# Patient Record
Sex: Female | Born: 1935
Health system: Southern US, Community
[De-identification: ages and names within clinical notes are randomized; demographics above are authoritative.]

## PROBLEM LIST (undated history)

## (undated) DIAGNOSIS — I495 Sick sinus syndrome: Secondary | ICD-10-CM

## (undated) DIAGNOSIS — R58 Hemorrhage, not elsewhere classified: Secondary | ICD-10-CM

## (undated) DIAGNOSIS — G4733 Obstructive sleep apnea (adult) (pediatric): Secondary | ICD-10-CM

## (undated) DIAGNOSIS — Z7901 Long term (current) use of anticoagulants: Secondary | ICD-10-CM

## (undated) DIAGNOSIS — E785 Hyperlipidemia, unspecified: Secondary | ICD-10-CM

## (undated) DIAGNOSIS — I4821 Permanent atrial fibrillation: Secondary | ICD-10-CM

## (undated) DIAGNOSIS — K5792 Diverticulitis of intestine, part unspecified, without perforation or abscess without bleeding: Secondary | ICD-10-CM

## (undated) DIAGNOSIS — I5040 Unspecified combined systolic (congestive) and diastolic (congestive) heart failure: Secondary | ICD-10-CM

## (undated) DIAGNOSIS — E669 Obesity, unspecified: Secondary | ICD-10-CM

## (undated) DIAGNOSIS — N183 Chronic kidney disease, stage 3 unspecified: Secondary | ICD-10-CM

## (undated) DIAGNOSIS — Z9989 Dependence on other enabling machines and devices: Secondary | ICD-10-CM

## (undated) DIAGNOSIS — T82118A Breakdown (mechanical) of other cardiac electronic device, initial encounter: Secondary | ICD-10-CM

## (undated) DIAGNOSIS — C569 Malignant neoplasm of unspecified ovary: Secondary | ICD-10-CM

## (undated) DIAGNOSIS — I739 Peripheral vascular disease, unspecified: Secondary | ICD-10-CM

## (undated) DIAGNOSIS — I219 Acute myocardial infarction, unspecified: Secondary | ICD-10-CM

## (undated) DIAGNOSIS — I701 Atherosclerosis of renal artery: Secondary | ICD-10-CM

## (undated) DIAGNOSIS — I447 Left bundle-branch block, unspecified: Secondary | ICD-10-CM

## (undated) DIAGNOSIS — I8390 Asymptomatic varicose veins of unspecified lower extremity: Secondary | ICD-10-CM

## (undated) DIAGNOSIS — I2510 Atherosclerotic heart disease of native coronary artery without angina pectoris: Secondary | ICD-10-CM

## (undated) DIAGNOSIS — I10 Essential (primary) hypertension: Secondary | ICD-10-CM

## (undated) HISTORY — DX: Unspecified combined systolic (congestive) and diastolic (congestive) heart failure: I50.40

## (undated) HISTORY — PX: SALIVARY GLAND SURGERY: SHX768

## (undated) HISTORY — PX: ABDOMINAL HYSTERECTOMY: SHX81

## (undated) HISTORY — DX: Essential (primary) hypertension: I10

## (undated) HISTORY — DX: Chronic kidney disease, stage 3 unspecified: N18.30

## (undated) HISTORY — DX: Sick sinus syndrome: I49.5

## (undated) HISTORY — DX: Diverticulitis of intestine, part unspecified, without perforation or abscess without bleeding: K57.92

## (undated) HISTORY — DX: Breakdown (mechanical) of other cardiac electronic device, initial encounter: T82.118A

## (undated) HISTORY — DX: Hyperlipidemia, unspecified: E78.5

## (undated) HISTORY — PX: CORONARY ANGIOPLASTY WITH STENT PLACEMENT: SHX49

## (undated) HISTORY — DX: Chronic kidney disease, stage 3 (moderate): N18.3

## (undated) HISTORY — DX: Permanent atrial fibrillation: I48.21

## (undated) HISTORY — DX: Obesity, unspecified: E66.9

## (undated) HISTORY — PX: OTHER SURGICAL HISTORY: SHX169

## (undated) HISTORY — PX: CHOLECYSTECTOMY: SHX55

---

## 1975-02-04 HISTORY — PX: VARICOSE VEIN SURGERY: SHX832

## 1978-10-05 HISTORY — PX: CHOLECYSTECTOMY: SHX55

## 1998-02-03 HISTORY — PX: UMBILICAL GRANULOMA EXCISION: SHX2597

## 1998-02-03 HISTORY — PX: CORONARY ANGIOPLASTY WITH STENT PLACEMENT: SHX49

## 1998-03-01 ENCOUNTER — Encounter: Payer: Self-pay | Admitting: Geriatric Medicine

## 1998-03-01 ENCOUNTER — Inpatient Hospital Stay (HOSPITAL_COMMUNITY): Admission: EM | Admit: 1998-03-01 | Discharge: 1998-03-07 | Payer: Self-pay | Admitting: Emergency Medicine

## 1998-03-01 ENCOUNTER — Encounter: Payer: Self-pay | Admitting: Emergency Medicine

## 1998-06-29 ENCOUNTER — Encounter: Payer: Self-pay | Admitting: Emergency Medicine

## 1998-06-29 ENCOUNTER — Inpatient Hospital Stay (HOSPITAL_COMMUNITY): Admission: EM | Admit: 1998-06-29 | Discharge: 1998-07-03 | Payer: Self-pay | Admitting: Emergency Medicine

## 1998-07-24 ENCOUNTER — Encounter (HOSPITAL_COMMUNITY): Admission: RE | Admit: 1998-07-24 | Discharge: 1998-10-22 | Payer: Self-pay | Admitting: Cardiology

## 1998-09-24 ENCOUNTER — Ambulatory Visit (HOSPITAL_COMMUNITY): Admission: RE | Admit: 1998-09-24 | Discharge: 1998-09-24 | Payer: Self-pay | Admitting: Cardiology

## 1998-09-24 ENCOUNTER — Encounter: Payer: Self-pay | Admitting: Cardiology

## 1998-10-05 HISTORY — PX: SALIVARY GLAND SURGERY: SHX768

## 1998-10-23 ENCOUNTER — Encounter (HOSPITAL_COMMUNITY): Admission: RE | Admit: 1998-10-23 | Discharge: 1999-01-21 | Payer: Self-pay | Admitting: Cardiology

## 1999-08-13 ENCOUNTER — Encounter: Admission: RE | Admit: 1999-08-13 | Discharge: 1999-08-13 | Payer: Self-pay | Admitting: Internal Medicine

## 1999-08-13 ENCOUNTER — Encounter: Payer: Self-pay | Admitting: Internal Medicine

## 1999-11-20 ENCOUNTER — Ambulatory Visit (HOSPITAL_COMMUNITY): Admission: RE | Admit: 1999-11-20 | Discharge: 1999-11-20 | Payer: Self-pay

## 2000-02-04 DIAGNOSIS — I219 Acute myocardial infarction, unspecified: Secondary | ICD-10-CM

## 2000-02-04 HISTORY — DX: Acute myocardial infarction, unspecified: I21.9

## 2000-12-16 ENCOUNTER — Encounter: Admission: RE | Admit: 2000-12-16 | Discharge: 2000-12-16 | Payer: Self-pay | Admitting: Internal Medicine

## 2000-12-16 ENCOUNTER — Encounter: Payer: Self-pay | Admitting: Internal Medicine

## 2001-12-28 ENCOUNTER — Encounter: Admission: RE | Admit: 2001-12-28 | Discharge: 2001-12-28 | Payer: Self-pay | Admitting: Internal Medicine

## 2001-12-28 ENCOUNTER — Encounter: Payer: Self-pay | Admitting: Internal Medicine

## 2002-02-03 HISTORY — PX: COLON SURGERY: SHX602

## 2002-08-12 ENCOUNTER — Encounter: Payer: Self-pay | Admitting: Internal Medicine

## 2002-08-12 ENCOUNTER — Inpatient Hospital Stay (HOSPITAL_COMMUNITY): Admission: AD | Admit: 2002-08-12 | Discharge: 2002-08-15 | Payer: Self-pay | Admitting: Internal Medicine

## 2002-08-24 ENCOUNTER — Encounter (INDEPENDENT_AMBULATORY_CARE_PROVIDER_SITE_OTHER): Payer: Self-pay | Admitting: *Deleted

## 2002-08-24 ENCOUNTER — Ambulatory Visit (HOSPITAL_COMMUNITY): Admission: RE | Admit: 2002-08-24 | Discharge: 2002-08-24 | Payer: Self-pay | Admitting: *Deleted

## 2002-11-04 ENCOUNTER — Ambulatory Visit (HOSPITAL_COMMUNITY): Admission: RE | Admit: 2002-11-04 | Discharge: 2002-11-04 | Payer: Self-pay | Admitting: Gastroenterology

## 2002-11-28 ENCOUNTER — Encounter: Payer: Self-pay | Admitting: General Surgery

## 2002-11-29 ENCOUNTER — Ambulatory Visit (HOSPITAL_COMMUNITY): Admission: RE | Admit: 2002-11-29 | Discharge: 2002-11-29 | Payer: Self-pay | Admitting: *Deleted

## 2002-12-06 ENCOUNTER — Encounter (INDEPENDENT_AMBULATORY_CARE_PROVIDER_SITE_OTHER): Payer: Self-pay | Admitting: Specialist

## 2002-12-06 ENCOUNTER — Inpatient Hospital Stay (HOSPITAL_COMMUNITY): Admission: RE | Admit: 2002-12-06 | Discharge: 2002-12-13 | Payer: Self-pay | Admitting: General Surgery

## 2003-02-04 HISTORY — PX: HERNIA REPAIR: SHX51

## 2003-06-20 ENCOUNTER — Ambulatory Visit (HOSPITAL_COMMUNITY): Admission: RE | Admit: 2003-06-20 | Discharge: 2003-06-20 | Payer: Self-pay | Admitting: Oncology

## 2003-11-01 ENCOUNTER — Ambulatory Visit (HOSPITAL_COMMUNITY): Admission: RE | Admit: 2003-11-01 | Discharge: 2003-11-01 | Payer: Self-pay | Admitting: Cardiology

## 2003-11-01 HISTORY — PX: CARDIAC CATHETERIZATION: SHX172

## 2003-11-29 ENCOUNTER — Inpatient Hospital Stay (HOSPITAL_COMMUNITY): Admission: RE | Admit: 2003-11-29 | Discharge: 2003-11-30 | Payer: Self-pay | Admitting: General Surgery

## 2004-03-21 ENCOUNTER — Ambulatory Visit: Payer: Self-pay | Admitting: Oncology

## 2004-04-09 ENCOUNTER — Encounter: Admission: RE | Admit: 2004-04-09 | Discharge: 2004-04-09 | Payer: Self-pay | Admitting: Oncology

## 2004-04-30 ENCOUNTER — Ambulatory Visit (HOSPITAL_COMMUNITY): Admission: RE | Admit: 2004-04-30 | Discharge: 2004-04-30 | Payer: Self-pay | Admitting: Oncology

## 2004-05-10 ENCOUNTER — Ambulatory Visit: Payer: Self-pay | Admitting: Oncology

## 2004-08-23 ENCOUNTER — Ambulatory Visit: Payer: Self-pay | Admitting: Oncology

## 2004-10-30 ENCOUNTER — Ambulatory Visit (HOSPITAL_COMMUNITY): Admission: RE | Admit: 2004-10-30 | Discharge: 2004-10-30 | Payer: Self-pay | Admitting: Oncology

## 2005-01-02 ENCOUNTER — Ambulatory Visit: Payer: Self-pay | Admitting: Oncology

## 2005-05-05 ENCOUNTER — Ambulatory Visit (HOSPITAL_COMMUNITY): Admission: RE | Admit: 2005-05-05 | Discharge: 2005-05-05 | Payer: Self-pay | Admitting: Oncology

## 2005-05-12 ENCOUNTER — Ambulatory Visit (HOSPITAL_COMMUNITY): Admission: RE | Admit: 2005-05-12 | Discharge: 2005-05-12 | Payer: Self-pay | Admitting: Oncology

## 2005-05-15 ENCOUNTER — Ambulatory Visit: Payer: Self-pay | Admitting: Oncology

## 2005-05-19 ENCOUNTER — Encounter: Admission: RE | Admit: 2005-05-19 | Discharge: 2005-05-19 | Payer: Self-pay | Admitting: Internal Medicine

## 2005-06-17 ENCOUNTER — Encounter: Admission: RE | Admit: 2005-06-17 | Discharge: 2005-06-17 | Payer: Self-pay | Admitting: Internal Medicine

## 2005-07-01 ENCOUNTER — Encounter: Admission: RE | Admit: 2005-07-01 | Discharge: 2005-07-01 | Payer: Self-pay | Admitting: Internal Medicine

## 2005-07-07 ENCOUNTER — Ambulatory Visit (HOSPITAL_COMMUNITY): Admission: RE | Admit: 2005-07-07 | Discharge: 2005-07-07 | Payer: Self-pay | Admitting: Oncology

## 2005-07-07 ENCOUNTER — Encounter (INDEPENDENT_AMBULATORY_CARE_PROVIDER_SITE_OTHER): Payer: Self-pay | Admitting: *Deleted

## 2005-07-18 ENCOUNTER — Ambulatory Visit: Payer: Self-pay | Admitting: Oncology

## 2005-10-01 ENCOUNTER — Encounter (INDEPENDENT_AMBULATORY_CARE_PROVIDER_SITE_OTHER): Payer: Self-pay | Admitting: Specialist

## 2005-10-01 ENCOUNTER — Inpatient Hospital Stay (HOSPITAL_COMMUNITY): Admission: RE | Admit: 2005-10-01 | Discharge: 2005-10-06 | Payer: Self-pay | Admitting: General Surgery

## 2005-10-20 ENCOUNTER — Ambulatory Visit: Payer: Self-pay | Admitting: Oncology

## 2005-10-22 LAB — COMPREHENSIVE METABOLIC PANEL
ALT: 20 U/L (ref 0–40)
CO2: 31 mEq/L (ref 19–32)
Calcium: 9.5 mg/dL (ref 8.4–10.5)
Chloride: 102 mEq/L (ref 96–112)
Creatinine, Ser: 0.69 mg/dL (ref 0.40–1.20)
Glucose, Bld: 85 mg/dL (ref 70–99)
Sodium: 143 mEq/L (ref 135–145)
Total Protein: 6.5 g/dL (ref 6.0–8.3)

## 2005-10-22 LAB — CBC WITH DIFFERENTIAL/PLATELET
BASO%: 0 % (ref 0.0–2.0)
Eosinophils Absolute: 0.4 10*3/uL (ref 0.0–0.5)
HCT: 35.9 % (ref 34.8–46.6)
LYMPH%: 24.9 % (ref 14.0–48.0)
MCHC: 34.3 g/dL (ref 32.0–36.0)
MONO#: 0.5 10*3/uL (ref 0.1–0.9)
NEUT#: 4 10*3/uL (ref 1.5–6.5)
NEUT%: 60.6 % (ref 39.6–76.8)
Platelets: 512 10*3/uL — ABNORMAL HIGH (ref 145–400)
WBC: 6.5 10*3/uL (ref 3.9–10.0)
lymph#: 1.6 10*3/uL (ref 0.9–3.3)

## 2006-02-13 ENCOUNTER — Ambulatory Visit: Payer: Self-pay | Admitting: Oncology

## 2006-02-18 LAB — COMPREHENSIVE METABOLIC PANEL
AST: 23 U/L (ref 0–37)
Albumin: 3.9 g/dL (ref 3.5–5.2)
Alkaline Phosphatase: 99 U/L (ref 39–117)
Glucose, Bld: 89 mg/dL (ref 70–99)
Potassium: 4 mEq/L (ref 3.5–5.3)
Sodium: 142 mEq/L (ref 135–145)
Total Bilirubin: 0.6 mg/dL (ref 0.3–1.2)
Total Protein: 6.6 g/dL (ref 6.0–8.3)

## 2006-02-18 LAB — CBC WITH DIFFERENTIAL/PLATELET
BASO%: 0.2 % (ref 0.0–2.0)
Eosinophils Absolute: 0.2 10*3/uL (ref 0.0–0.5)
MCHC: 33.8 g/dL (ref 32.0–36.0)
MONO#: 0.5 10*3/uL (ref 0.1–0.9)
NEUT#: 3.8 10*3/uL (ref 1.5–6.5)
RBC: 4.35 10*6/uL (ref 3.70–5.32)
WBC: 6 10*3/uL (ref 3.9–10.0)
lymph#: 1.5 10*3/uL (ref 0.9–3.3)

## 2006-04-03 ENCOUNTER — Ambulatory Visit (HOSPITAL_BASED_OUTPATIENT_CLINIC_OR_DEPARTMENT_OTHER): Admission: RE | Admit: 2006-04-03 | Discharge: 2006-04-03 | Payer: Self-pay | Admitting: Orthopedic Surgery

## 2006-05-22 ENCOUNTER — Encounter: Admission: RE | Admit: 2006-05-22 | Discharge: 2006-05-22 | Payer: Self-pay | Admitting: Oncology

## 2006-05-28 ENCOUNTER — Ambulatory Visit: Payer: Self-pay | Admitting: Oncology

## 2006-06-02 LAB — CBC WITH DIFFERENTIAL/PLATELET
Basophils Absolute: 0 10*3/uL (ref 0.0–0.1)
EOS%: 3.6 % (ref 0.0–7.0)
Eosinophils Absolute: 0.2 10*3/uL (ref 0.0–0.5)
HGB: 13.7 g/dL (ref 11.6–15.9)
LYMPH%: 22.7 % (ref 14.0–48.0)
MCH: 30.2 pg (ref 26.0–34.0)
MCV: 86.4 fL (ref 81.0–101.0)
MONO%: 7.6 % (ref 0.0–13.0)
NEUT#: 3.7 10*3/uL (ref 1.5–6.5)
Platelets: 254 10*3/uL (ref 145–400)
RBC: 4.54 10*6/uL (ref 3.70–5.32)
RDW: 14.1 % (ref 11.3–14.5)

## 2006-06-02 LAB — COMPREHENSIVE METABOLIC PANEL
AST: 27 U/L (ref 0–37)
Alkaline Phosphatase: 95 U/L (ref 39–117)
BUN: 19 mg/dL (ref 6–23)
Glucose, Bld: 89 mg/dL (ref 70–99)
Total Bilirubin: 0.6 mg/dL (ref 0.3–1.2)

## 2006-06-08 ENCOUNTER — Ambulatory Visit (HOSPITAL_COMMUNITY): Admission: RE | Admit: 2006-06-08 | Discharge: 2006-06-08 | Payer: Self-pay | Admitting: Oncology

## 2006-07-21 ENCOUNTER — Observation Stay (HOSPITAL_COMMUNITY): Admission: EM | Admit: 2006-07-21 | Discharge: 2006-07-24 | Payer: Self-pay | Admitting: Emergency Medicine

## 2006-12-14 ENCOUNTER — Ambulatory Visit: Payer: Self-pay | Admitting: Oncology

## 2006-12-16 LAB — CBC WITH DIFFERENTIAL/PLATELET
Basophils Absolute: 0 10*3/uL (ref 0.0–0.1)
Eosinophils Absolute: 0.2 10*3/uL (ref 0.0–0.5)
HCT: 39.4 % (ref 34.8–46.6)
HGB: 13.6 g/dL (ref 11.6–15.9)
LYMPH%: 22.5 % (ref 14.0–48.0)
MCH: 30.1 pg (ref 26.0–34.0)
MCV: 87.4 fL (ref 81.0–101.0)
MONO%: 7.1 % (ref 0.0–13.0)
NEUT#: 4.7 10*3/uL (ref 1.5–6.5)
NEUT%: 66.7 % (ref 39.6–76.8)
Platelets: 269 10*3/uL (ref 145–400)

## 2006-12-16 LAB — COMPREHENSIVE METABOLIC PANEL
Albumin: 4 g/dL (ref 3.5–5.2)
Alkaline Phosphatase: 102 U/L (ref 39–117)
BUN: 21 mg/dL (ref 6–23)
Creatinine, Ser: 0.68 mg/dL (ref 0.40–1.20)
Glucose, Bld: 80 mg/dL (ref 70–99)
Potassium: 4 mEq/L (ref 3.5–5.3)

## 2007-05-25 ENCOUNTER — Encounter: Admission: RE | Admit: 2007-05-25 | Discharge: 2007-05-25 | Payer: Self-pay | Admitting: Oncology

## 2007-06-01 ENCOUNTER — Ambulatory Visit: Payer: Self-pay | Admitting: Oncology

## 2007-06-04 LAB — COMPREHENSIVE METABOLIC PANEL
ALT: 19 U/L (ref 0–35)
BUN: 16 mg/dL (ref 6–23)
CO2: 29 mEq/L (ref 19–32)
Calcium: 8.8 mg/dL (ref 8.4–10.5)
Chloride: 104 mEq/L (ref 96–112)
Creatinine, Ser: 0.64 mg/dL (ref 0.40–1.20)
Glucose, Bld: 94 mg/dL (ref 70–99)
Total Bilirubin: 0.6 mg/dL (ref 0.3–1.2)

## 2007-06-04 LAB — CBC WITH DIFFERENTIAL/PLATELET
Basophils Absolute: 0 10*3/uL (ref 0.0–0.1)
Eosinophils Absolute: 0.2 10*3/uL (ref 0.0–0.5)
HCT: 39.6 % (ref 34.8–46.6)
HGB: 13.8 g/dL (ref 11.6–15.9)
LYMPH%: 23.5 % (ref 14.0–48.0)
MCHC: 34.8 g/dL (ref 32.0–36.0)
MONO#: 0.6 10*3/uL (ref 0.1–0.9)
NEUT#: 3.5 10*3/uL (ref 1.5–6.5)
NEUT%: 61.5 % (ref 39.6–76.8)
Platelets: 260 10*3/uL (ref 145–400)
WBC: 5.6 10*3/uL (ref 3.9–10.0)
lymph#: 1.3 10*3/uL (ref 0.9–3.3)

## 2007-06-11 ENCOUNTER — Ambulatory Visit (HOSPITAL_COMMUNITY): Admission: RE | Admit: 2007-06-11 | Discharge: 2007-06-11 | Payer: Self-pay | Admitting: Oncology

## 2007-09-03 ENCOUNTER — Inpatient Hospital Stay (HOSPITAL_BASED_OUTPATIENT_CLINIC_OR_DEPARTMENT_OTHER): Admission: RE | Admit: 2007-09-03 | Discharge: 2007-09-03 | Payer: Self-pay | Admitting: Cardiology

## 2007-09-03 HISTORY — PX: CARDIAC CATHETERIZATION: SHX172

## 2007-09-08 ENCOUNTER — Inpatient Hospital Stay (HOSPITAL_COMMUNITY): Admission: RE | Admit: 2007-09-08 | Discharge: 2007-09-09 | Payer: Self-pay | Admitting: Cardiology

## 2007-09-29 ENCOUNTER — Encounter (INDEPENDENT_AMBULATORY_CARE_PROVIDER_SITE_OTHER): Payer: Self-pay | Admitting: Otolaryngology

## 2007-09-29 ENCOUNTER — Ambulatory Visit (HOSPITAL_COMMUNITY): Admission: RE | Admit: 2007-09-29 | Discharge: 2007-09-30 | Payer: Self-pay | Admitting: Otolaryngology

## 2007-12-20 ENCOUNTER — Ambulatory Visit: Payer: Self-pay | Admitting: Oncology

## 2007-12-22 LAB — CBC WITH DIFFERENTIAL/PLATELET
BASO%: 0.6 % (ref 0.0–2.0)
Basophils Absolute: 0 10*3/uL (ref 0.0–0.1)
EOS%: 3.5 % (ref 0.0–7.0)
HGB: 13.5 g/dL (ref 11.6–15.9)
MCH: 30.5 pg (ref 26.0–34.0)
MCHC: 34.6 g/dL (ref 32.0–36.0)
MCV: 88.1 fL (ref 81.0–101.0)
MONO%: 8.7 % (ref 0.0–13.0)
RBC: 4.44 10*6/uL (ref 3.70–5.32)
RDW: 14.3 % (ref 11.3–14.5)
lymph#: 1.7 10*3/uL (ref 0.9–3.3)

## 2007-12-22 LAB — COMPREHENSIVE METABOLIC PANEL
ALT: 18 U/L (ref 0–35)
BUN: 17 mg/dL (ref 6–23)
CO2: 30 mEq/L (ref 19–32)
Calcium: 9.5 mg/dL (ref 8.4–10.5)
Creatinine, Ser: 0.71 mg/dL (ref 0.40–1.20)
Total Bilirubin: 0.6 mg/dL (ref 0.3–1.2)

## 2008-05-25 ENCOUNTER — Encounter: Admission: RE | Admit: 2008-05-25 | Discharge: 2008-05-25 | Payer: Self-pay | Admitting: Internal Medicine

## 2008-06-02 ENCOUNTER — Ambulatory Visit: Payer: Self-pay | Admitting: Oncology

## 2008-06-09 ENCOUNTER — Ambulatory Visit (HOSPITAL_COMMUNITY): Admission: RE | Admit: 2008-06-09 | Discharge: 2008-06-09 | Payer: Self-pay | Admitting: Oncology

## 2008-12-07 ENCOUNTER — Ambulatory Visit: Payer: Self-pay | Admitting: Oncology

## 2008-12-12 LAB — COMPREHENSIVE METABOLIC PANEL
AST: 30 U/L (ref 0–37)
Albumin: 3.9 g/dL (ref 3.5–5.2)
Alkaline Phosphatase: 87 U/L (ref 39–117)
BUN: 21 mg/dL (ref 6–23)
Calcium: 9.2 mg/dL (ref 8.4–10.5)
Chloride: 101 mEq/L (ref 96–112)
Potassium: 3.4 mEq/L — ABNORMAL LOW (ref 3.5–5.3)
Sodium: 140 mEq/L (ref 135–145)
Total Protein: 6.7 g/dL (ref 6.0–8.3)

## 2008-12-14 ENCOUNTER — Ambulatory Visit (HOSPITAL_COMMUNITY): Admission: RE | Admit: 2008-12-14 | Discharge: 2008-12-14 | Payer: Self-pay | Admitting: Oncology

## 2009-05-23 ENCOUNTER — Encounter (INDEPENDENT_AMBULATORY_CARE_PROVIDER_SITE_OTHER): Payer: Self-pay | Admitting: General Surgery

## 2009-05-23 ENCOUNTER — Ambulatory Visit (HOSPITAL_COMMUNITY): Admission: RE | Admit: 2009-05-23 | Discharge: 2009-05-24 | Payer: Self-pay | Admitting: General Surgery

## 2009-06-19 ENCOUNTER — Ambulatory Visit: Payer: Self-pay | Admitting: Oncology

## 2009-06-20 LAB — CBC WITH DIFFERENTIAL/PLATELET
HCT: 40.3 % (ref 34.8–46.6)
LYMPH%: 23.1 % (ref 14.0–49.7)
MCH: 30.2 pg (ref 25.1–34.0)
MCHC: 33.8 g/dL (ref 31.5–36.0)
MCV: 89.6 fL (ref 79.5–101.0)
MONO#: 0.5 10*3/uL (ref 0.1–0.9)
Platelets: 248 10*3/uL (ref 145–400)
WBC: 6.1 10*3/uL (ref 3.9–10.3)

## 2009-06-20 LAB — COMPREHENSIVE METABOLIC PANEL
ALT: 16 U/L (ref 0–35)
Albumin: 4 g/dL (ref 3.5–5.2)
Alkaline Phosphatase: 90 U/L (ref 39–117)
CO2: 32 mEq/L (ref 19–32)
Chloride: 102 mEq/L (ref 96–112)
Creatinine, Ser: 0.74 mg/dL (ref 0.40–1.20)
Sodium: 143 mEq/L (ref 135–145)
Total Bilirubin: 0.9 mg/dL (ref 0.3–1.2)

## 2009-07-18 ENCOUNTER — Inpatient Hospital Stay (HOSPITAL_COMMUNITY): Admission: EM | Admit: 2009-07-18 | Discharge: 2009-07-20 | Payer: Self-pay | Admitting: Emergency Medicine

## 2009-07-19 ENCOUNTER — Ambulatory Visit: Payer: Self-pay | Admitting: Vascular Surgery

## 2009-07-19 ENCOUNTER — Encounter (INDEPENDENT_AMBULATORY_CARE_PROVIDER_SITE_OTHER): Payer: Self-pay | Admitting: Internal Medicine

## 2009-09-27 ENCOUNTER — Ambulatory Visit: Payer: Self-pay | Admitting: Cardiology

## 2009-10-11 ENCOUNTER — Encounter: Admission: RE | Admit: 2009-10-11 | Discharge: 2009-10-11 | Payer: Self-pay | Admitting: Internal Medicine

## 2010-02-24 ENCOUNTER — Encounter: Payer: Self-pay | Admitting: Oncology

## 2010-02-24 ENCOUNTER — Encounter: Payer: Self-pay | Admitting: Internal Medicine

## 2010-04-01 ENCOUNTER — Ambulatory Visit (INDEPENDENT_AMBULATORY_CARE_PROVIDER_SITE_OTHER): Payer: Medicare Other | Admitting: Nurse Practitioner

## 2010-04-01 DIAGNOSIS — E7800 Pure hypercholesterolemia, unspecified: Secondary | ICD-10-CM

## 2010-04-01 DIAGNOSIS — I209 Angina pectoris, unspecified: Secondary | ICD-10-CM

## 2010-04-01 DIAGNOSIS — I2510 Atherosclerotic heart disease of native coronary artery without angina pectoris: Secondary | ICD-10-CM

## 2010-04-03 ENCOUNTER — Other Ambulatory Visit (INDEPENDENT_AMBULATORY_CARE_PROVIDER_SITE_OTHER): Payer: Medicare Other

## 2010-04-03 DIAGNOSIS — I10 Essential (primary) hypertension: Secondary | ICD-10-CM

## 2010-04-03 DIAGNOSIS — E7800 Pure hypercholesterolemia, unspecified: Secondary | ICD-10-CM

## 2010-04-04 ENCOUNTER — Other Ambulatory Visit: Payer: Medicare Other

## 2010-04-21 LAB — COMPREHENSIVE METABOLIC PANEL
Alkaline Phosphatase: 81 U/L (ref 39–117)
BUN: 15 mg/dL (ref 6–23)
CO2: 27 mEq/L (ref 19–32)
Chloride: 108 mEq/L (ref 96–112)
Creatinine, Ser: 0.7 mg/dL (ref 0.4–1.2)
GFR calc non Af Amer: >60 mL/min (ref >60)
Glucose, Bld: 104 mg/dL — ABNORMAL HIGH (ref 70–99)
Potassium: 3.7 mEq/L (ref 3.5–5.1)
Total Bilirubin: 0.9 mg/dL (ref 0.3–1.2)

## 2010-04-21 LAB — DIFFERENTIAL
Eosinophils Relative: 4 % (ref 0–5)
Lymphocytes Relative: 29 % (ref 12–46)
Lymphs Abs: 1.9 10*3/uL (ref 0.7–4.0)
Neutro Abs: 3.7 10*3/uL (ref 1.7–7.7)

## 2010-04-21 LAB — CARDIAC PANEL(CRET KIN+CKTOT+MB+TROPI)
CK, MB: 1.6 ng/mL (ref 0.3–4.0)
CK, MB: 1.6 ng/mL (ref 0.3–4.0)
Relative Index: INVALID (ref 0.0–2.5)
Relative Index: INVALID (ref 0.0–2.5)
Total CK: 76 U/L (ref 7–177)
Troponin I: 0.01 ng/mL (ref 0.00–0.06)
Troponin I: 0.02 ng/mL (ref 0.00–0.06)
Troponin I: 0.02 ng/mL (ref 0.00–0.06)

## 2010-04-21 LAB — CBC
HCT: 37.5 % (ref 36.0–46.0)
HCT: 38.2 % (ref 36.0–46.0)
Hemoglobin: 13.1 g/dL (ref 12.0–15.0)
Hemoglobin: 13.1 g/dL (ref 12.0–15.0)
MCV: 88.4 fL (ref 78.0–100.0)
Platelets: 210 10*3/uL (ref 150–400)
RBC: 4.24 MIL/uL (ref 3.87–5.11)
WBC: 5.7 10*3/uL (ref 4.0–10.5)
WBC: 6.6 10*3/uL (ref 4.0–10.5)

## 2010-04-21 LAB — URINALYSIS, ROUTINE W REFLEX MICROSCOPIC
Bilirubin Urine: NEGATIVE
Glucose, UA: NEGATIVE mg/dL
Hgb urine dipstick: NEGATIVE
Protein, ur: NEGATIVE mg/dL
Urobilinogen, UA: 1 mg/dL (ref 0.0–1.0)

## 2010-04-21 LAB — HEMOGLOBIN A1C: Mean Plasma Glucose: 128 mg/dL — ABNORMAL HIGH (ref <117)

## 2010-04-21 LAB — BASIC METABOLIC PANEL
BUN: 20 mg/dL (ref 6–23)
Calcium: 9.2 mg/dL (ref 8.4–10.5)
GFR calc non Af Amer: >60 mL/min (ref >60)
Potassium: 3.4 mEq/L — ABNORMAL LOW (ref 3.5–5.1)
Sodium: 142 mEq/L (ref 135–145)

## 2010-04-21 LAB — URINE MICROSCOPIC-ADD ON

## 2010-04-21 LAB — PROTIME-INR
INR: 0.98 (ref 0.00–1.49)
Prothrombin Time: 12.9 seconds (ref 11.6–15.2)

## 2010-04-21 LAB — RAPID URINE DRUG SCREEN, HOSP PERFORMED
Amphetamines: NOT DETECTED
Cocaine: NOT DETECTED
Opiates: NOT DETECTED
Tetrahydrocannabinol: NOT DETECTED

## 2010-04-21 LAB — LIPID PANEL
Triglycerides: 63 mg/dL (ref <150)
VLDL: 13 mg/dL (ref 0–40)

## 2010-04-21 LAB — MAGNESIUM: Magnesium: 2.1 mg/dL (ref 1.5–2.5)

## 2010-04-23 LAB — URINALYSIS, ROUTINE W REFLEX MICROSCOPIC
Bilirubin Urine: NEGATIVE
Glucose, UA: NEGATIVE mg/dL
Ketones, ur: NEGATIVE mg/dL
Nitrite: NEGATIVE
Protein, ur: NEGATIVE mg/dL
pH: 6.5 (ref 5.0–8.0)

## 2010-04-23 LAB — TYPE AND SCREEN
ABO/RH(D): A POS
Antibody Screen: NEGATIVE

## 2010-04-23 LAB — COMPREHENSIVE METABOLIC PANEL
Alkaline Phosphatase: 92 U/L (ref 39–117)
BUN: 19 mg/dL (ref 6–23)
Chloride: 104 mEq/L (ref 96–112)
Creatinine, Ser: 0.77 mg/dL (ref 0.4–1.2)
GFR calc non Af Amer: >60 mL/min (ref >60)
Glucose, Bld: 131 mg/dL — ABNORMAL HIGH (ref 70–99)
Potassium: 3.4 mEq/L — ABNORMAL LOW (ref 3.5–5.1)
Total Bilirubin: 0.6 mg/dL (ref 0.3–1.2)

## 2010-04-23 LAB — URINE MICROSCOPIC-ADD ON

## 2010-04-23 LAB — DIFFERENTIAL
Basophils Absolute: 0 10*3/uL (ref 0.0–0.1)
Basophils Relative: 0 % (ref 0–1)
Lymphocytes Relative: 28 % (ref 12–46)
Monocytes Absolute: 0.5 10*3/uL (ref 0.1–1.0)
Neutro Abs: 4.5 10*3/uL (ref 1.7–7.7)
Neutrophils Relative %: 60 % (ref 43–77)

## 2010-04-23 LAB — CBC
HCT: 38 % (ref 36.0–46.0)
Hemoglobin: 12.8 g/dL (ref 12.0–15.0)
MCV: 90.8 fL (ref 78.0–100.0)
WBC: 7.4 10*3/uL (ref 4.0–10.5)

## 2010-05-31 ENCOUNTER — Other Ambulatory Visit: Payer: Self-pay | Admitting: Oncology

## 2010-05-31 DIAGNOSIS — Z1231 Encounter for screening mammogram for malignant neoplasm of breast: Secondary | ICD-10-CM

## 2010-06-07 ENCOUNTER — Inpatient Hospital Stay (HOSPITAL_COMMUNITY)
Admission: EM | Admit: 2010-06-07 | Discharge: 2010-06-08 | DRG: 392 | Disposition: A | Discharge status: Home / Self Care | Payer: Medicare Other | Attending: Internal Medicine | Admitting: Internal Medicine

## 2010-06-07 ENCOUNTER — Emergency Department (HOSPITAL_COMMUNITY): Payer: Medicare Other

## 2010-06-07 DIAGNOSIS — K5732 Diverticulitis of large intestine without perforation or abscess without bleeding: Secondary | ICD-10-CM | POA: Diagnosis present

## 2010-06-07 DIAGNOSIS — I701 Atherosclerosis of renal artery: Secondary | ICD-10-CM | POA: Diagnosis present

## 2010-06-07 DIAGNOSIS — Q602 Renal agenesis, unspecified: Secondary | ICD-10-CM

## 2010-06-07 DIAGNOSIS — K429 Umbilical hernia without obstruction or gangrene: Secondary | ICD-10-CM | POA: Diagnosis present

## 2010-06-07 DIAGNOSIS — Z9089 Acquired absence of other organs: Secondary | ICD-10-CM

## 2010-06-07 DIAGNOSIS — E785 Hyperlipidemia, unspecified: Secondary | ICD-10-CM | POA: Diagnosis present

## 2010-06-07 DIAGNOSIS — I252 Old myocardial infarction: Secondary | ICD-10-CM

## 2010-06-07 DIAGNOSIS — Z7982 Long term (current) use of aspirin: Secondary | ICD-10-CM

## 2010-06-07 DIAGNOSIS — R1031 Right lower quadrant pain: Principal | ICD-10-CM | POA: Diagnosis present

## 2010-06-07 DIAGNOSIS — E876 Hypokalemia: Secondary | ICD-10-CM | POA: Diagnosis present

## 2010-06-07 DIAGNOSIS — K439 Ventral hernia without obstruction or gangrene: Secondary | ICD-10-CM | POA: Diagnosis present

## 2010-06-07 DIAGNOSIS — Z79899 Other long term (current) drug therapy: Secondary | ICD-10-CM

## 2010-06-07 DIAGNOSIS — Q605 Renal hypoplasia, unspecified: Secondary | ICD-10-CM

## 2010-06-07 DIAGNOSIS — N12 Tubulo-interstitial nephritis, not specified as acute or chronic: Secondary | ICD-10-CM | POA: Diagnosis present

## 2010-06-07 DIAGNOSIS — I2510 Atherosclerotic heart disease of native coronary artery without angina pectoris: Secondary | ICD-10-CM | POA: Diagnosis present

## 2010-06-07 DIAGNOSIS — Z9079 Acquired absence of other genital organ(s): Secondary | ICD-10-CM

## 2010-06-07 DIAGNOSIS — I10 Essential (primary) hypertension: Secondary | ICD-10-CM | POA: Diagnosis present

## 2010-06-07 DIAGNOSIS — E860 Dehydration: Secondary | ICD-10-CM | POA: Diagnosis present

## 2010-06-07 DIAGNOSIS — N289 Disorder of kidney and ureter, unspecified: Secondary | ICD-10-CM | POA: Diagnosis present

## 2010-06-07 LAB — CBC
HCT: 41.7 % (ref 36.0–46.0)
MCHC: 33.6 g/dL (ref 30.0–36.0)
Platelets: 224 10*3/uL (ref 150–400)
RDW: 13.2 % (ref 11.5–15.5)

## 2010-06-07 LAB — DIFFERENTIAL
Basophils Absolute: 0 10*3/uL (ref 0.0–0.1)
Eosinophils Absolute: 0.1 10*3/uL (ref 0.0–0.7)
Eosinophils Relative: 1 % (ref 0–5)
Lymphocytes Relative: 7 % — ABNORMAL LOW (ref 12–46)
Monocytes Absolute: 0.3 10*3/uL (ref 0.1–1.0)

## 2010-06-08 ENCOUNTER — Inpatient Hospital Stay (HOSPITAL_COMMUNITY): Payer: Medicare Other

## 2010-06-08 LAB — BASIC METABOLIC PANEL
CO2: 30 mEq/L (ref 19–32)
Chloride: 106 mEq/L (ref 96–112)
Glucose, Bld: 96 mg/dL (ref 70–99)
Potassium: 3.6 mEq/L (ref 3.5–5.1)
Sodium: 142 mEq/L (ref 135–145)

## 2010-06-08 LAB — POCT CARDIAC MARKERS
CKMB, poc: 1.1 ng/mL (ref 1.0–8.0)
Myoglobin, poc: 108 ng/mL (ref 12–200)
Troponin i, poc: <0.05 ng/mL (ref 0.00–0.09)

## 2010-06-08 LAB — LIPID PANEL
Cholesterol: 111 mg/dL (ref 0–200)
LDL Cholesterol: 49 mg/dL (ref 0–99)

## 2010-06-08 LAB — CARDIAC PANEL(CRET KIN+CKTOT+MB+TROPI)
CK, MB: 2.6 ng/mL (ref 0.3–4.0)
CK, MB: 2.6 ng/mL (ref 0.3–4.0)
Relative Index: INVALID (ref 0.0–2.5)
Total CK: 70 U/L (ref 7–177)
Troponin I: <0.3 ng/mL (ref <0.30)

## 2010-06-08 LAB — COMPREHENSIVE METABOLIC PANEL
ALT: 20 U/L (ref 0–35)
AST: 25 U/L (ref 0–37)
AST: 26 U/L (ref 0–37)
Albumin: 2.8 g/dL — ABNORMAL LOW (ref 3.5–5.2)
CO2: 32 mEq/L (ref 19–32)
Calcium: 9.6 mg/dL (ref 8.4–10.5)
Calcium: 7.7 mg/dL — ABNORMAL LOW (ref 8.4–10.5)
Chloride: 106 mEq/L (ref 96–112)
Creatinine, Ser: 1.07 mg/dL (ref 0.4–1.2)
Creatinine, Ser: 0.9 mg/dL (ref 0.4–1.2)
GFR calc Af Amer: >60 mL/min (ref >60)
GFR calc Af Amer: >60 mL/min (ref >60)
GFR calc non Af Amer: 50 mL/min — ABNORMAL LOW (ref >60)
Glucose, Bld: 154 mg/dL — ABNORMAL HIGH (ref 70–99)
Sodium: 141 mEq/L (ref 135–145)
Total Bilirubin: 0.3 mg/dL (ref 0.3–1.2)

## 2010-06-08 LAB — CBC
Hemoglobin: 11.9 g/dL — ABNORMAL LOW (ref 12.0–15.0)
MCH: 29.5 pg (ref 26.0–34.0)
MCV: 90.1 fL (ref 78.0–100.0)
Platelets: 179 10*3/uL (ref 150–400)
RBC: 4.03 MIL/uL (ref 3.87–5.11)
WBC: 6 10*3/uL (ref 4.0–10.5)

## 2010-06-08 LAB — DIFFERENTIAL
Eosinophils Absolute: 0 10*3/uL (ref 0.0–0.7)
Lymphocytes Relative: 5 % — ABNORMAL LOW (ref 12–46)
Lymphs Abs: 0.3 10*3/uL — ABNORMAL LOW (ref 0.7–4.0)
Monocytes Relative: 7 % (ref 3–12)
Neutro Abs: 5.2 10*3/uL (ref 1.7–7.7)
Neutrophils Relative %: 87 % — ABNORMAL HIGH (ref 43–77)

## 2010-06-08 LAB — URINALYSIS, ROUTINE W REFLEX MICROSCOPIC
Bilirubin Urine: NEGATIVE
Glucose, UA: NEGATIVE mg/dL
Hgb urine dipstick: NEGATIVE
Protein, ur: NEGATIVE mg/dL

## 2010-06-08 LAB — APTT: aPTT: 25 seconds (ref 24–37)

## 2010-06-08 LAB — URINE MICROSCOPIC-ADD ON

## 2010-06-08 MED ORDER — IOHEXOL 300 MG/ML  SOLN
125.0000 mL | Freq: Once | INTRAMUSCULAR | Status: AC | PRN
  Administered 2010-06-08: 125 mL via INTRAVENOUS

## 2010-06-09 LAB — URINE CULTURE: Culture  Setup Time: 201205051141

## 2010-06-09 NOTE — H&P (Signed)
Patty [name], Patty [name]               ACCOUNT NO.:  [id number]  MEDICAL RECORD NO.:  [id number]           PATIENT TYPE:  E  LOCATION:  WLED                         FACILITY:  [facility]  PHYSICIAN:  Lonia Blood, M.D.      DATE OF BIRTH:  1930-06-25  DATE OF ADMISSION:  06/07/2010 DATE OF DISCHARGE:                             HISTORY & PHYSICAL   CHIEF COMPLAINT:  Abdominal pain.  HISTORY OF PRESENT ILLNESS:  This 77 year old female is followed in primary care by Dr. Earl Gala.  Her cardiologist is Dr. Deborah Chalk.  She presents to [facility] with a 24- to 48-hour history of right lower quadrant abdominal pain.  The pain has been waxing and waning, but generally worsening.  Pain is described as crampy and 9/10 at its worst intensity.  There is a past medical history of diverticular disease, but the patient does not feel that is what is causing her current symptoms.  After coming to the emergency room, she developed nausea and vomiting.  The patient denies any recent problems with constipation or diarrhea.  She is referred to [facility] for admission and post admission will be followed by the service of Dr. Earl Gala.  PAST MEDICAL HISTORY: 1. Coronary artery disease with prior stenting. 2. History of myocardial infarction. 3. Hypertension. 4. Hyperlipidemia. 5. Granuloma cell tumor with multiple surgeries, last about 2 months     ago. 6. 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 7. Hysterectomy and oophorectomy. 8. Laparoscopic cholecystectomy. 9. Previous carpal tunnel surgery. 10.Varicose vein surgery. 11.Orthoscopic surgery of the knee previously.  CURRENT MEDICATIONS:  Per the patient's medication list: 1. Aspirin 325 mg daily. 2. Caduet 10/80 mg daily. 3. Toprol-XL 50 mg twice daily. 4. Caltrate over-the-counter calcium carbonate 600 mg twice daily. 5. CoQ10 120 mg daily. 6. Lisinopril 40 mg daily. 7. Omega-3 fish oil 1 tablet daily. 8. Multivitamin  daily. 9. Plavix 75 mg daily. 10.Hydrochlorothiazide 25 mg daily. 11.Imdur 60 mg daily. 12.Vitamin C 500 mg twice daily as needed. 13.Vitamin E 400 international units daily. 14.Maitake mushrooms 3 times a day.  ALLERGIES:  Listed CODEINE, PERCOCET, DEMEROL, IBUPROFEN.  FAMILY HISTORY:  Propensity for coronary artery disease, diabetes, alcoholism.  SOCIAL HISTORY:  The patient is married.  Her husband is retired, but she continues to work.  Uses occasional alcohol.  Prior tobacco smoker 1 pack per day, quit approximately 35 years ago.  REVIEW OF SYSTEMS:  EYES:  No visual change, history of cataracts or glaucoma.  EARS:  No hearing loss, discharge, pain.  NOSE/THROAT:  No oral pain.  No rhinitis or sinusitis.  CARDIAC.  No central chest pain or palpitation.  She does have a history of coronary artery disease with prior stents.  RESPIRATORY:  No history of COPD or asthma.  She denies cough, increased sputum, dyspnea, or orthopnea.  ABDOMEN:  Prior surgeries for cancer as above.  She has known diverticular disease, but no diverticulitis.  URINARY/GENITAL:  She denies burning, blood, increased frequency.  Her main complaint is the right lower quadrant pain.  MUSCULOSKELETAL:  Denies myalgias or arthralgias.  NEUROLOGIC: Denies stroke or  seizure.  HEMATOLOGIC:  Denies abnormal bleeding or bruising.  PHYSICAL EXAMINATION:  VITAL SIGNS:  Temperature 97.3, pulse 66, respirations 15, blood pressure 148/49, O2 sats 100%. GENERAL APPEARANCE:  This is a well-developed elderly female in no distress.  She is alert, cooperative, oriented. HEENT:  Head:  Normocephalic.  Eyes:  Pupils equal and reactive.  Ears: Canals clear and hearing normal to conversational tone.  Nose:  Nares patent without discharge noted.  Oral mucosa pink and moist. NECK:  No jugular venous distention, bruits, adenopathy, or thyromegaly. CARDIAC:  Rate and rhythm regular without murmur, S3, S4.  Occasional irregular  beat. LUNGS:  Breath sounds are clear and equal bilaterally. ABDOMEN:  Soft with positive hypotonic bowel sounds in all 4 quadrants. There is some rebound tenderness right lower quadrant and pain is at its greatest with palpation over the right lower quadrant.  She does have bilateral CVA tenderness. URINARY/GENITAL:  There is no bladder pain.  She has bilateral CVA tenderness. NEUROLOGIC:  The patient is alert and oriented.  No unilateral or focal defects.  Cranial nerves II through XII grossly intact. MUSCULOSKELETAL:  Range of motion is full.  Strength 5/5 and equal x4. HEMATOLOGIC:  No abnormal bleeding or bruising seen.  RADIOLOGY AND LABORATORY DATA:  CT scan of the abdomen and pelvis notes no acute findings.  Interval progression of mild diffuse left renal atrophy and hypoperfusion consistent with left renal artery stenosis. Small stable hiatal hernia with tiny periumbilically hernia and small suprapubic hernia containing a loop of small bowel.  No evidence of bowel ischemia or obstruction.  Diverticulosis with no evidence of diverticulitis.  CBC with diff, WBC mildly elevated 10.7, hemoglobin 14.0, hematocrit 41.7, platelets 224.  Neutrophil absolute high 9.5. Lactic acid 0.9.  Comprehensive metabolic panel, sodium 141, potassium low at 3.1, chloride 99, CO2 32, BUN 24, and creatinine 1.07.  GFR is 50.  Prior baseline BUN and creatinine 15 and 0.10 July 2009, with GFR greater than 60 at that time.  Her liver function is unremarkable, but glucose elevated 154.  Lipase 34.  Urine microscopic 11-20 wbc's and rare bacteria.  Point-of-care cardiac markers CK-MB 1.1, troponin less than 0.05, myoglobin 108.  IMPRESSION/PLAN: 1. Pyelonephritis.  We will continue Rocephin 1 g IV daily started per     emergency department physician.  We will send urine for culture and     sensitivity.  Also blood cultures x2.  Repeat CBC with diff in a.m.     We will also check a renal ultrasound. 2.  Nausea and vomiting.  We will use Zofran p.r.n.  Clear liquids and     oral medications as tolerated. 3. Abdominal pain.  Morphine sulfate 1-2 mg IV every 4 hours as     needed. 4. Hypokalemia.  Receiving IV supplementation of potassium chloride     per emergency department physician order.  We will recheck a     comprehensive metabolic panel in a.m.  Also serum magnesium and     phosphorus levels. 5. Acute renal insufficiency and dehydration.  Likely secondary to     pyelonephritis and nausea and vomiting.  We will hydrate with     normal saline at 75 cc/hour in 10 mEq potassium chloride per liter.     Followup labs in a.m.  Also a renal ultrasound.  We will hold her     home medications ACE inhibitor and HCTZ given her acute renal     insufficiency and dehydration. 6. History of  coronary artery disease.  We will monitor on tele.     Cardiac enzymes q.6 h. x3 sets. 7. Hypertension.  We will continue home medications with exception of     ACE inhibitor and HCTZ with hold parameters listed. 8. Hyperlipidemia.  We will continue home meds and check liver     function and fasting lipids. 9. Deep vein thrombosis prophylaxis.  We will use Lovenox 40 mg subcu     daily. 10.Code status.  The patient will be full code.     Everett Graff, N.P.   ______________________________ Lonia Blood, M.D.    TC/MEDQ  D:  06/08/2010  T:  06/08/2010  Job:  [id number]  cc:   Theressa Millard, M.D. Fax: [phone]  Colleen Can. Deborah Chalk, M.D. Fax: [phone]  Electronically Signed by Everett Graff N.P. on 06/08/2010 06:51:51 PM Electronically Signed by Lonia Blood M.D. on 06/09/2010 10:09:26 PM

## 2010-06-10 NOTE — Discharge Summary (Signed)
Patty [name], FUHS               ACCOUNT NO.:  [id number]  MEDICAL RECORD NO.:  [id number]           PATIENT TYPE:  I  LOCATION:  1508                         FACILITY:  [facility]  PHYSICIAN:  Candyce Churn, M.D.DATE OF BIRTH:  02-28-1930  DATE OF ADMISSION:  06/07/2010 DATE OF DISCHARGE:  06/08/2010                              DISCHARGE SUMMARY   DISCHARGE DIAGNOSES: 1. Abdominal pain, question etiology, possibly secondary to low grade     pyelonephritis or diverticulitis, not revealed on abdominal and     pelvic CT scanning 2. Left renal artery stenosis with left renal hypoplasia. 3. History of hypertension. 4. History of coronary artery disease status post stenting. 5. Hyperlipidemia. 6. History of granuloma cell tumor with multiple surgeries, last     surgery approximately 2 months prior to admission. 7. Surgery for mastoid tumor, benign. 8. Hysterectomy and oophorectomy. 9. Status post laparoscopic cholecystectomy. 10.Status post carpal tunnel surgery. 11.Varicose vein surgery. 12.Arthroscopic surgery of the knee previously.  DISCHARGE MEDICATIONS: 1. Aspirin 325 mg daily. 2. Caduet 10/80 mg daily. 3. Toprol XL 50 mg b.i.d. 4. Caltrate with D 600 mg b.i.d. 5. CoQ10 120 mg daily. 6. Omega-3 fish oil 1 g daily. 7. Multivitamin daily. 8. Plavix 75 mg daily. 9. HCTZ, will be on hold. 10.Lisinopril 40 mg, will be on hold. 11.Imdur 60 mg daily. 12.Vitamin C 500 mg b.i.d. 13.Vitamin E 400 International Units daily. 14.Morphine sulfate 5 mg p.o. q.6 h p.r.n. abdominal pain, 5 tablets     provided. 15.Ciprofloxacin 500 mg b.i.d. for 3 days, pending urine culture     result.  ALLERGIES:  CODEINE, PERCOCET, DEMEROL, IBUPROFEN.  DISCHARGE LABORATORY DATA:  Phosphorous 3.7, magnesium 1.8, sodium 141, potassium 3.4, chloride 106, bicarb 26, glucose 136, BUN 23, creatinine 0.9, total bili 0.3, alk phos 77, AST 26, ALT 20, total protein 5.5, albumin 2.8, and calcium  7.7.  Protime was 13.7 seconds and PTT was 25 seconds.  White count on admission was 10,700 and at discharge was 6000 with hemoglobin 11.9, platelet count 179,000 with 87% neutrophils. Cardiac markers on admission revealed CK of 1.1, troponin less than 0.05 and myoglobin 108 and lactic acid was 0.9.  Urinalysis was clear with specific gravity 1.023, pH 6.0, nitrite negative, leukocytes were moderate and microscopic exam reveals 11 to 20 white cells, rare bacteria, and few squamous cells.  Lipase was 34, normal.  Radiographic procedures revealed the following:  Renal ultrasound, formal results are pending.  CT of the abdomen and pelvis performed at 01:30 on Jun 08, 2010, revealed progression of mild diffuse left renal atrophy and hypoperfusion consistent with left renal artery stenosis when comparing CT scanning of the abdomen and pelvis from December 14, 2008, approximately 18 months ago.  There was a stable small hiatal hernia and a tiny periumbilical hernia and a small suprapubic hernia containing the loop of small bowel.There was no evidence of bowel ischemia or obstruction.  There was diverticulosis but no radiographic evidence of diverticulitis.  HOSPITAL COURSE:  [name] is a very pleasant 77 year old female who developed abdominal pain the day prior to admission described as crampy  with intensity of 9 out of 10 at its worst.  It was intermittent.  She first noticed some crampy pain the day prior to admission and the day of admission, she had severe pain and telephoned me and I instructed her to go to the emergency room for further evaluation.  She had nausea, vomiting and near syncope in the emergency room after nausea and vomiting, felt likely to be vagal.  She was hydrated in the emergency room and worked up with the above findings.  She was started on intravenous Rocephin and admitted for further observation and after just 6 to 8 hours of IV hydration, she is clinically  much improved.  She still has some right lower abdomen moderate tenderness to palpation but much improved.  Bowel sounds are normal.  No nausea.  Ready to take clear liquids this morning.  Laboratories were as above.  ASSESSMENT:  Abdominal pain syndrome, question etiology but improving. She did have rather spicy, greasy food on the day prior to admission which she thinks might have contributed.  She has had no dysuria or flank pain.  She does have diverticulosis but no signs of diverticulitis on CT scanning.  She does have an abdominal hernia suprapubically along the inferior margin of the surgical mass which contains a loop of small bowel but CT scanning showed no obstruction or bowel ischemia.  She does have a few white cells and bacteria in her urine and she is eating well and pain is well controlled today.  We will plan on discharging home on ciprofloxacin for 3 days awaiting culture results. Should she did redevelop nausea and vomiting and pain, she will contact us immediately and plans will be to follow with Dr. Earl Gala next week in the office as long as she is continuing to clinically improve.  We will send her home with morphine sulfate 5 mg p.o. q.6 h p.r.n. with just 5 tablets and she will notify us if she has to take any secondary to recurrent pain.  Time spent examining the patient, reviewing chart, and performing discharge summary was 35 minutes.  I should mention that we will be holding lisinopril and HCTZ at discharge secondary to left renal artery stenosis and mild azotemia     Candyce Churn, M.D.     RNG/MEDQ  D:  06/08/2010  T:  06/08/2010  Job:  [id number]  cc:   Theressa Millard, M.D. Fax: [phone]  Electronically Signed by Marden Noble M.D. on 06/10/2010 05:39:06 AM

## 2010-06-14 LAB — CULTURE, BLOOD (ROUTINE X 2)
Culture  Setup Time: 201205051142
Culture: NO GROWTH

## 2010-06-18 NOTE — H&P (Signed)
NAMEBAYYINAH, DUKEMAN               ACCOUNT NO.:  [id number]   MEDICAL RECORD NO.:  [id number]         PATIENT TYPE:  JCAR   LOCATION:                               FACILITY:  [facility]   PHYSICIAN:  Colleen Can. Deborah Chalk, M.D.DATE OF BIRTH:  Jun 19, 1930   DATE OF ADMISSION:  09/03/2007  DATE OF DISCHARGE:                              HISTORY & PHYSICAL   CHIEF COMPLAINT:  None.   HISTORY OF PRESENT ILLNESS:  Patty [name] is a very pleasant 77 year old  white female who has a known history of ischemic heart disease.  She has  had remote stenting of the LAD and left circumflex approximately 8-9  years ago.  She was in need of preoperative clearance for removal of a  questionable mass in the right parotid region with Dr. Narda Bonds.  She underwent a stress Cardiolite study on August 31, 2007.  She had good  exercise tolerance and exercised for a total of 8 minutes on the  standard Bruce protocol.  She was hypertensive and clinically had  complaints of angina.  Her EKG was positive for ischemia.  There was  felt to be probable anterolateral ischemia on her images as well.  These  findings could be consistent with her known distal coronary  atherosclerosis, but in light of the fact that she needs upcoming  surgery with prolonged anesthesia, she is now referred for cardiac  catheterization.  Clinically, she has had really no significant chest  pain.  She does use prophylactic nitroglycerin spray with satisfactory  results.   Her past medical history:  1. Known atherosclerotic cardiovascular disease.  She had a previous      history of a myocardial infarction in May 2000 and was treated with      angioplasty and stenting of the LAD x1 as well as subsequent      stenting of the left circumflex x1.  This procedure was complicated      by right groin pseudoaneurysm.  Her last catheterization was in      September 2005.  At that time, she had normal LV function,      moderately severe diffuse coronary  disease distally in the left      circumflex with moderate diffuse atherosclerosis in the LAD, and      right coronary artery beds.  She is felt to best be managed      medically and has been maintained on chronic Plavix.  2. Hypertension.  3. Hyperlipidemia.  4. Remote tobacco abuse.  5. History of a granulosa cell tumor.  She has had previous surgeries      in 2000, 2004, and 2007.  She is followed by Dr. Johna Sheriff and Dr.      Darrold Span.  6. History of hysterectomy.  7. Bilateral salpingo-oophorectomy.  8. Laparoscopic cholecystectomy.  9. Previous carpal tunnel surgery.  10.Varicose vein surgery.  11.Chronic knee pain.  12.Obesity.  13.History of diverticulitis.  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   Allergies are ALL NARCOTICS except morphine.   CURRENT MEDICATIONS:  1. Aspirin 325 mg a day.  2. Metoprolol  XL 50 mg b.i.d.  3. Caltrate 600 b.i.d.  4. Coenzyme Q10 daily.  5. Lisinopril 40 mg a day.  6. Omega 3 b.i.d.  7. Multivitamin daily.  8. Plavix 75 mg a day.  9. Hydrochlorothiazide 25 mg a day.  10.Imdur 60 mg a day.  11.Vitamin C twice a day.  12.Vitamin E daily.  13.EpiPen p.r.n.  14.Caduet, dose is unknown daily.  15.Nitroglycerin spray p.r.n.   FAMILY HISTORY:  Her father died at 27 with heart attack.  Mother died  at the age of 47.   SOCIAL HISTORY:  She is married.  She lives at home with her husband.  She does have social alcohol use.  She has had no tobacco products for  over 25 years.  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   REVIEW OF SYSTEMS:  She has had some exertional chest pain, but she uses  nitroglycerin spray on a prophylactic basis with good response.  She has  really had no symptoms at rest.  She is able to swim and exercise  regularly.  She has had no shortness of breath.  She is not lightheaded  or dizzy.  She has had no recent fever, flu, or cough.   PHYSICAL EXAMINATION:  GENERAL:  She is  very pleasant white female.  She  is in no acute distress.  VITAL SIGNS:  Blood pressure is 118/68 sitting, 124/70 standing, heart  rate 60 and regular, respirations 18.  She is afebrile.  Her weight is  177 pounds.  SKIN:  Warm and dry.  Color is unremarkable.  HEENT:  Does show about a marble-size mass in the right parotid region.  LUNGS:  Basically clear.  CARDIAC:  Shows a regular rhythm.  She is obese.  ABDOMEN:  Soft, positive bowel sounds, nontender.  EXTREMITIES: Without edema.  NEUROLOGIC:  Shows no gross focal deficits.   Pertinent labs are pending.   Overall impression:  1. Abnormal stress Cardiolite study.  2. Known ischemic heart disease with moderate-to-severe diffuse distal      disease, previously felt to best be managed medically.  3. Need for preoperative clearance with prolonged general anesthesia      for questionable right parotid mass.  4. History of granulosa cell tumor.  5. Hypertension.  6. Hyperlipidemia.  7. Obesity.   PLAN:  We will proceed on with diagnostic cardiac catheterization.  Procedure has been reviewed in full detail, and she is willing to  proceed on Friday September 03, 2007.      Sharlee Blew, N.P.       Colleen Can. Deborah Chalk, M.D.  Electronically Signed    LC/MEDQ  D:  09/01/2007  T:  09/02/2007  Job:  [id number]   cc:   Kristine Garbe. Ezzard Standing, M.D.

## 2010-06-18 NOTE — Cardiovascular Report (Signed)
NAMELORELIE, BIERMANN               ACCOUNT NO.:  [id number]   MEDICAL RECORD NO.:  [id number]         PATIENT TYPE:  JCAR   LOCATION:                               FACILITY:  [facility]   PHYSICIAN:  Colleen Can. Deborah Chalk, M.D.DATE OF BIRTH:  [birth year]   DATE OF PROCEDURE:  09/03/2007  DATE OF DISCHARGE:                            CARDIAC CATHETERIZATION   TYPE AND SITE OF ENTRY:  Percutaneous right femoral artery.   CATHETERS:  A 4-French four curved Judkins left coronary artery  catheter, 4-French five curved Judkins left coronary catheter, 4-French  3-D RC right coronary catheter, and 4-French pigtail ventriculographic  catheter.   CONTRAST MATERIAL:  Omnipaque.   MEDICATIONS GIVEN PRIOR TO PROCEDURE:  Valium 10 mg p.o.   MEDICATIONS GIVEN DURING THE PROCEDURE:  Versed 2 mg IV.   COMMENT:  The patient tolerated the procedure well.   HEMODYNAMIC DATA:  The aortic pressure was 121/51 and LV was 127/17-21.  There is no aortic valve gradient noted on pullback.   ANGIOGRAPHIC DATA:  1. Left main coronary artery is normal.  2. Left circumflex.  The left circumflex had a patent stent      proximally.  Out of this stent, there arose the first diagonal      vessel which had a 95+ percent somewhat focal stenosis, although      the entire vessel was diffusely diseased, there clearly was a very      tight focal proximal narrowing.  The second obtuse marginal had a      50% ostial narrowing and then there was scattered somewhat diffuse      distal disease present.  3. Left anterior descending.  Left anterior descending had a patent      stent proximally.  There was excellent flow.  In the mid portion,      there was a focal 50% narrowing.  There was diffuse disease as we      extended toward the apex.   Right coronary artery.  The right coronary artery is a dominant vessel.  It has diffuse disease both proximally and distally.  There is no  greater than 50-60% narrowing diffusely  throughout the vessel.  There  does not appear to be obstructive disease, although the atherosclerosis  is rather extensive.  The posterior descending and distal vessels are  relatively small.   Left ventricular angiogram was performed in RAO position.  The overall  cardiac size and silhouette were normal.  Global ejection fraction was  estimated to be 70%.  Regional wall motion is normal.   OVERALL IMPRESSION:  1. Normal somewhat hyperdynamic left ventricular function.  2. Diffuse three-vessel coronary disease with patent stent in the left      anterior descending and patent stent in left circumflex, but with a      severe proximal stenosis in an obtuse marginal vessel.  3. Otherwise, distal vessel disease.   DISCUSSION:  It is felt that Ms. Verhoeven may benefit from an  angioplasty of the proximal obtuse marginal.  It does come out of a  stented segment and would create some  difficulties technically in that  manner.  There is diffuse disease in the obtuse marginal beyond that  point and there really would be no place to perform a stent.  The vessel  is relatively small, but it does have a severe proximal stenosis.  Otherwise, it is felt that her ischemia is probably due to her diffuse  distal disease.       Colleen Can. Deborah Chalk, M.D.  Electronically Signed     SNT/MEDQ  D:  09/03/2007  T:  09/04/2007  Job:  [id number]   cc:   Kristine Garbe. Ezzard Standing, M.D.

## 2010-06-18 NOTE — H&P (Signed)
NAME:  Patty [name], Patty [name]               ACCOUNT NO.:  [id number]   MEDICAL RECORD NO.:  [id number]          PATIENT TYPE:  OIB   LOCATION:  NA                           FACILITY:  [facility]   PHYSICIAN:  Colleen Can. Deborah Chalk, M.D.DATE OF BIRTH:  12/06/1930   DATE OF ADMISSION:  DATE OF DISCHARGE:                              HISTORY & PHYSICAL   CHIEF COMPLAINT:  None.   HISTORY OF PRESENT ILLNESS:  Mrs. Claudio is a very pleasant 76-year-  old white female who has a known history of ischemic heart disease.  She  has plans for upcoming surgery and had preoperative clearance which  consisted of stress testing.  Her stress test showed positive EKG  changes as well as anterolateral ischemia.  She underwent diagnostic  cardiac catheterization on September 03, 2007, which showed somewhat  hyperdynamic LV function yet normal.  There was diffuse 3-vessel  coronary artery disease with a patent stent in the left anterior  descending and a patent stent in the left circumflex, but there is a  severe stenosis in the proximal portion of the obtuse marginal.  Otherwise, she does have known distal vessel disease which is unchanged.  She now presents for attempts at angioplasty to the proximal obtuse  marginal.  Clinically, she has had no complaints of chest pain.  She  does continue to exercise on a regular basis.   PAST MEDICAL HISTORY.:  1. Known atherosclerotic cardiovascular disease.  She had a previous      history of a myocardial infarction in May 2000 which was treated      with angioplasty and stenting of the LAD x1 and subsequent stenting      of the left circumflex x1.  Procedure was complicated by right      groin pseudoaneurysm.  Her last catheterization was on September 03, 2007.  She is managed on chronic Plavix.  2. Hypertension.  3. Hyperlipidemia.  4. Tobacco abuse.  5. Granulosa cell tumor with previous surgeries in 2000, 2004, and      2007.  6. New parotid tumor with need for upcoming  surgery.  7. History of hysterectomy.  8. Bilateral salpingo-oophorectomy.  9. Laparoscopic cholecystectomy.  10.Previous carpal tunnel surgery.  11.Varicose vein surgery.  12.Chronic knee pain.  13.Obesity.  14.Diverticulitis.   ALLERGIES:  ALL NARCOTICS except morphine.   CURRENT MEDICINES:  1. Aspirin 325 mg a day.  2. Metoprolol 50 b.i.d.  3. Caltrate 600 b.i.d.  4. Coenzyme Q 10 daily.  5. Lisinopril 40 a day.  6. Omega 3 twice a day.  7. Multivitamin daily.  8. Plavix 75 mg a day.  9. Hydrochlorothiazide 25 mg a day.  10.Imdur 60 mg a day.  11.Vitamin C twice a day.  12.Vitamin E daily.  13.Epipen p.r.n.  14.Caduet 10-40 daily.  15.Nitroglycerin spray p.r.n.   FAMILY HISTORY:  Unchanged.   SOCIAL HISTORY:  Unchanged.   REVIEW OF SYSTEMS:  Unchanged.   PHYSICAL EXAMINATION:  GENERAL:  She is very pleasant, elderly white  female.  She is in no acute distress.  She  is alert and cooperative.  VITAL SIGNS:  Blood pressure 118/68 sitting, 124/70 standing; heart rate  60 and regular; respirations 18; she is afebrile; and her weight 177  pounds.  SKIN:  Warm and dry.  Color is unremarkable.  HEENT:  Does show a marble size mass in the right parotid region.  Otherwise, unremarkable.  LUNGS:  Clear.  HEART:  Regular rhythm with no murmur.  ABDOMEN:  Soft and obese.  Positive bowel sounds.  EXTREMITIES:  Without edema.  NEUROLOGIC:  No gross focal deficits.   Pertinent labs are pending.   IMPRESSION:  1. Severe stenosis in the obtuse marginal branch per recent cardiac      catheterization.  2. Abnormal stress Cardiolite study showing anterolateral ischemia.  3. Extensive atherosclerotic cardiovascular disease with known distal      disease.  4. Need for preoperative clearance for prolonged general anesthesia      for a questionable right parotid mass.  5. History of granulosa cell tumor.  6. Hypertension.  7. Hyperlipidemia.  8. Obesity.   PLAN:  We will  proceed on with attempts at revascularization.  Procedure  has been reviewed in full detail, and she is willing to proceed on  Wednesday, September 08, 2007.      Sharlee Blew, N.P.      Colleen Can. Deborah Chalk, M.D.  Electronically Signed    LC/MEDQ  D:  09/08/2007  T:  09/08/2007  Job:  [id number]   cc:   Lennis P. Darrold Span, M.D.  Kristine Garbe. Ezzard Standing, M.D.  Theressa Millard, M.D.

## 2010-06-18 NOTE — Op Note (Signed)
NAME:  Patty [name], Patty [name]               ACCOUNT NO.:  [id number]   MEDICAL RECORD NO.:  [id number]          PATIENT TYPE:  OIB   LOCATION:  3309                         FACILITY:  [facility]   PHYSICIAN:  Kristine Garbe. Ezzard Standing, M.D.DATE OF BIRTH:  10/10/1930   DATE OF PROCEDURE:  09/29/2007  DATE OF DISCHARGE:                               OPERATIVE REPORT   PREOPERATIVE DIAGNOSIS:  Right parotid mass.   POSTOPERATIVE DIAGNOSIS:  Right parotid right masseter muscle mass.   OPERATION:  Excision of right parotid mass without facial nerve  dissection.   SURGEON:  Kristine Garbe. Ezzard Standing, MD   ANESTHESIA:  General endotracheal.   COMPLICATIONS:  None.   CLINICAL NOTE:  Patty [name] is a 77 year old female who has noticed a  right cheek right parotid mass now for several months.  She has a  history of granulosa cell carcinoma, has had surgeries previously to  remove these carcinomas.  These have been located mostly intra-  abdominal.  However, over the last 2 months, she has developed a nodule  in the right parotid area.  On palpation, she has a firm approximately 1-  1/2 cm mass located at the anterior aspect of the right parotid gland  directly over the masseter muscle.  She was taken to the operating room  at this time for excision of right parotid right masseter muscle mass.   DESCRIPTION OF PROCEDURE:  After adequate endotracheal anesthesia, the  right parotid duct was cannulated with a 20-gauge Angiocath for  identification later when removing the parotid mass.  An oblique  incision was made directly over the mass in the right cheek area.  Dissection was carried down through the skin and subcutaneous tissue.  The anterior aspect of the parotid gland was identified and this mass  was actually just anterior to the anterior aspect of the parotid gland,  but it was deep within the masseter muscle.  The masseter musculature  was divided longitudinally and the mass which was very vascular  was  identified.  It was slightly encapsulated.  The mass was dissected out  of the masseter muscle and measured approximately 1-1/2 cm in size.  Hemostasis was obtained with a cautery and the mass was sent in formalin  to pathology.  After obtaining adequate hemostasis, subcutaneous tissue  was reapproximated with 4-0 Vicryl suture and skin was closed with a 6-0  nylon subcuticular stitch and Steri-Strips.  Patty [name] tolerated the surgery  well, was woken from anesthesia and transferred to the recovery room  postop doing well.  Because of the patient's significant cardiac  history, she will be admitted for observation for the next 24 hours and  care by myself and Dr. Roger Shelter.   DISPOSITION:  The patient will be discharged tomorrow and plan followup  in my office in 5-6 days for recheck and review pathology.           ______________________________  Kristine Garbe Ezzard Standing, M.D.     CEN/MEDQ  D:  09/29/2007  T:  09/30/2007  Job:  [id number]   cc:   Theressa Millard, M.D.  Colleen Can. Deborah Chalk,  M.D.  Ottie Glazier P. Darrold Span, M.D.

## 2010-06-18 NOTE — Discharge Summary (Signed)
Patty [name], Patty [name]               ACCOUNT NO.:  [id number]   MEDICAL RECORD NO.:  [id number]          PATIENT TYPE:  INP   LOCATION:  6707                         FACILITY:  [facility]   PHYSICIAN:  Theressa Millard, M.D.    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   DISCHARGE DIAGNOSES:  1. 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  2. Coronary artery disease.  3. Granulosa cell tumor.   The patient is a 78 year old white female who has history of granulosa  cell tumor as well as coronary artery disease with a history of  myocardial infarction and stenting.  She was in her usual state of  health until she suffered a bee sting just before admission.  She had  medially developed systemic symptoms.  EMTs were called and an IV line  was started.  She was given Benadryl steroids and epinephrine in the  field.  By the time she got to the hospital she was more awake and  alert.   HOSPITAL COURSE:  The patient was admitted and started on Solu-Medrol  every 6 hours as well as IV Benadryl every 6 hours.  This was continued  over the duration of her hospitalization.  She did very well with  resolution in the swelling around the lips and eyes within the first 24-  36 hours.  She continued on the medications and had no further rebound  or evidence of biphasic anaphylaxis.   I had a discussion with the patient concerning issues regarding this  disorder which is quite serious.  A prescription was given to the  patient for an Epipen which she is to have filled before she is  discharged.  We will make an appointment for her to see an allergist to  begin possible immunotherapy for hymenoptera.  I have advised her to go  [email or web address] and search the patient information under anaphylaxis  and in particular to print and read the  sections on anaphylaxis and use  of the Epipen AutoInjector.  In regard to this, it is carefully  explained to the patient that if she has another sting, she is to call  for help and then use her Epipen immediately.  Even if she feels like  she does not need medical attention, she is to proceed to the nearest  emergency room.  She should get a bracelet or necklace that indicates  that she has hymenoptera allergy.  She should avoid situations in which  she might likely have exposure to stinging insects.   DISCHARGE MEDICATIONS:  1. Metoprolol 50 mg of b.i.d.  2. Lisinopril 40 mg daily.  3. Hydrochlorothiazide 25 mg daily.  4. Caduet 10/20 daily.  5. Plavix 75 mg daily.  6. Imdur 60 mg daily.  7. Aspirin daily.  8. Multivitamin daily.  9. Calcium 1000 mg daily.  10.Co-Q 10 daily.  11.Vitamin E daily.  12.Vitamin C daily.  13.Vitamin B complex daily.  14.Omega III daily.  15.Green  Tea extract daily.  16.Prednisone 20 mg three daily x2 days, then two daily x2 days, then      one daily x2 days and then discontinue.   FOLLOW UP:  She should follow-up with the cardiologist as usual.  We  will make an appointment for her see an allergist.   ACTIVITY:  As tolerated.      Theressa Millard, M.D.  Electronically Signed     JO/MEDQ  D:  07/24/2006  T:  07/24/2006  Job:  [id number]

## 2010-06-18 NOTE — Discharge Summary (Signed)
Patty [name], Patty [name]               ACCOUNT NO.:  [id number]   MEDICAL RECORD NO.:  [id number]          PATIENT TYPE:  OIB   LOCATION:  2629                         FACILITY:  [facility]   PHYSICIAN:  Colleen Can. Deborah Chalk, M.D.DATE OF BIRTH:  04-Apr-1930   DATE OF ADMISSION:  09/08/2007  DATE OF DISCHARGE:  09/09/2007                               DISCHARGE SUMMARY   DISCHARGE DIAGNOSES:  1. Failed percutaneous coronary intervention attempt to the obtuse      marginal.  2. Recent cardiac catheterization showing somewhat hyperdynamic left      ventricular function yet normal with diffuse three-vessel coronary      artery disease with a patent stent to the left anterior descending,      as well as the left circumflex but with a severe stenosis in the      proximal portion of the obtuse marginal.  She otherwise has no      distal vessel disease which is unchanged per recent catheterization      in July 2009.  3. Need for prolonged general anesthesia for a new parotid tumor.  4. History of granulosa cell tumor with previous surgeries in 2000,      2004 and 2007.  5. Hypertension.  6. Hyperlipidemia.  7. Past history of tobacco abuse.  8. Known atherosclerotic cardiovascular disease.  She had a previous      myocardial infarction in May 2000 which was treated with stenting      of the left anterior descending and subsequent stenting of the left      circumflex.  She is maintained on chronic Plavix.   HISTORY OF PRESENT ILLNESS:  Patty [name] is a very pleasant 76-year-  old white female who has known ischemic heart disease.  She had plans  for upcoming surgery due to the finding of a mass in the right parotid  region.  She does have a history of granulosa cell cancer.  Preoperative  clearance was obtained.  She subsequently had an abnormal stress test  and underwent cardiac catheterization on September 03, 2007.  It was felt  that we could attempt to perform angioplasty to the severe stenosis in  the proximal portion of the obtuse marginal.  Clinically, she has had  chest pain but it is responsive to nitroglycerin and she uses  nitroglycerin spray on a prophylactic basis with very good results.   Please see the history and physical for further patient presentation and  profile.   LABORATORY DATA ON ADMISSION:  CBC was normal.  Chemistry showed a  potassium of 3.3, BUN 17, creatinine 0.6.  PT and PTT were unremarkable.   HOSPITAL COURSE:  The patient was admitted electively.  She underwent  attempts at PCI to the obtuse marginal, however, the lesion was unable  to be crossed.  The procedure was subsequently aborted and she was  watched overnight and today on September 09, 2007, she is doing well without  complaints.  Her physical exam is unchanged.  Groin is unremarkable and  she is felt to be a satisfactory candidate for discharge.  She will need  to continue with medical management.   DISCHARGE CONDITION:  Stable.   DISCHARGE DIET:  Heart-healthy.   ACTIVITIES:  To be increased as tolerated.   She is to use an ice pack if needed to the groin.   Discharge medicines will be all that she was taking before which  include:  1. Aspirin 325 a day.  2. Metoprolol 50 b.i.d.  3. Lisinopril 40 a day.  4. Multivitamin daily.  5. Plavix 75 mg a day.  6. Hydrochlorothiazide 25 mg a day.  7. Imdur 60 mg a day.  8. Caduet daily.  9. Nitroglycerin spray p.r.n. as well as to be used prophylactically      prior to exercise.  10.She may also resume her Caltrate coenzyme QT and omega-3, vitamin C      and vitamin E as she was taking before.   We will plan on seeing her back in the office in approximately 1 week,  certainly sooner if needed.      Sharlee Blew, N.P.      Colleen Can. Deborah Chalk, M.D.  Electronically Signed    LC/MEDQ  D:  09/09/2007  T:  09/09/2007  Job:  [id number]   cc:   Theressa Millard, M.D.  Kristine Garbe. Ezzard Standing, M.D.  Lennis P. Darrold Span, M.D.

## 2010-06-18 NOTE — H&P (Signed)
Patty [name], Patty [name]               ACCOUNT NO.:  [id number]   MEDICAL RECORD NO.:  [id number]          PATIENT TYPE:  INP   LOCATION:  1824                         FACILITY:  [facility]   PHYSICIAN:  Hollice Espy, M.D.DATE OF BIRTH:  [birth year]   DATE OF ADMISSION:  07/21/2006  DATE OF DISCHARGE:                              HISTORY & PHYSICAL   PRIMARY CARE PHYSICIAN:  Theressa Millard, M.D.   CARDIOLOGIST:  Colleen Can. Deborah Chalk, M.D.   CHIEF COMPLAINT:  Allergic rhinitis.   HISTORY OF PRESENT ILLNESS:  Patient is a 77 year old white female with  a past medical history of a rare form of abdominal wall cancer as well  as CAD, status post MI, who presents to the emergency room after an  anaphylactic reaction.  Patient was outside today when she was stung by  a bee.  She was able to get immediate assistance.  Paramedics were  called, and she had an IV line started.  Her blood pressure dropped, as  she was in a full anaphylactic reaction, but she was able to get IV  Benadryl, steroids, and epinephrine.  Patient was able to have her  reaction reversed, and by the time she got to the emergency room, her  blood pressure had improved to 119/66.  She still complains of some  throat tenderness as well as has some swelling of her lips and eyes.  It  was felt best that she stay in overnight for observation.   Currently, she says she just feels rough overall.  She says her  breathing is relatively stable.  She denies any headaches or visual  changes, other than the fact that her eyes are swollen.  She feels like  her throat is tight.  She denies any chest pain or palpitations.  No  shortness of breath.  She does note some wheezing but no coughing, no  abdominal pain, no hematuria, dysuria, constipation, or diarrhea.  No  focal extremity numbness, weakness, or pain.  Review of systems  otherwise negative.   Patient's past medical history includes CAD, status post stent, with a  history of MI.   She also has a history of recurrent and rare abdominal  wall tumor that requires surgical resection every few years.   MEDICATIONS:  1. She is on Caduet, dose unknown, daily.  2. Metoprolol 50 p.o. b.i.d.  3. Lisinopril 40 p.o. daily.  4. HCTZ 25 p.o. daily.  5. Plavix 75 p.o. daily.  6. Aspirin 81 p.o. daily.   She has allergies to ALL NARCOTICS except morphine.   SOCIAL HISTORY:  She denies any current tobacco, heavy alcohol or drug  use.   FAMILY HISTORY:  Noncontributory.   PHYSICAL EXAMINATION:  VITALS ON ADMISSION:  Temp 97.2, heart rate 65,  blood pressure 119/66, respirations 20, O2 sat 100% on 2 liters.  GENERAL:  Patient is alert and oriented x3 in no apparent distress.  HEENT:  Normocephalic and atraumatic.  Mucous membranes are moist.  NECK:  She has no carotid bruits.  HEART:  Regular rate and rhythm.  S1 and S2.  LUNGS:  Clear  to auscultation bilaterally.  She is moving air well.  ABDOMEN:  Soft, obese, nontender.  Positive bowel sounds.  EXTREMITIES:  No clubbing, cyanosis or edema.   Lab work is drawn and pending.   ASSESSMENT/PLAN:  1. Anaphylactic reaction, likely from bee sting.  We have reviewed the      literature and will treat the patient with IV Benadryl 25 mg q.6h.,      cimetidine 400 p.o. b.i.d., p.r.n. albuterol nebs, and IV Solu-      Medrol 60 mg q.6h.  Patient will need to remain on Benadryl and      Solu-Medrol for a total of 72 hours.  On discharge, she will be      given a prescription for an epinephrine pen.  2. Coronary artery disease:  Continue the patient's blood pressure      medications, watching her blood pressure and holding for a systolic      less than 120.  3. History of recurrent abdominal wall tumor, stable.      Hollice Espy, M.D.  Electronically Signed     SKK/MEDQ  D:  07/21/2006  T:  07/21/2006  Job:  [id number]   cc:   Theressa Millard, M.D.  Colleen Can. Deborah Chalk, M.D.

## 2010-06-18 NOTE — Cardiovascular Report (Signed)
Patty [name], Patty [name]               ACCOUNT NO.:  [id number]   MEDICAL RECORD NO.:  [id number]          PATIENT TYPE:  OIB   LOCATION:  2629                         FACILITY:  [facility]   PHYSICIAN:  Colleen Can. Deborah Chalk, M.D.DATE OF BIRTH:  November 20, 1930   DATE OF PROCEDURE:  09/08/2007  DATE OF DISCHARGE:                            CARDIAC CATHETERIZATION   PROCEDURE:  Attempted angioplasty of the obtuse marginal vessel.   The first obtuse marginal vessel of the left circumflex arose out of the  stented segment.  It had antegrade flow, but instead of the 70%  narrowing that was noted in 2005, she had a 95% stenosis and with  moderate tortuosity and was felt to be a candidate for angioplasty.  She  had positive exercise stress test, although did not localize this one  particular area on Cardiolite study, she had marked EKG changes in her  symptoms.   PROCEDURE:  We entered via the right femoral artery without difficulty.  The guide was a Voda 4.0 left with side holes and provided excellent  backup.  We had initially tried to cross with a P[place]ater guidewire and  we were able to enter the ostium of the lesion for the first 1-2 cm, but  not able to advance the wire.  We then returned with a Whisper guidewire  and were able to cross into the obtuse marginal and advanced it easily.  We had initially tried to cross with a 2.0 x 12-mm apex and we were  unable to do so.  We tried to use a [place] guidewire again as a buddy  wire across the stenosis, but we were unable to pass that into the  obtuse marginal.  We advanced the guidewire into the distal left  circumflex to give [place] more support.  We were unable to pass a 1.5 x 6-mm  Sprinter balloon both before and after using the buddy wire.   After attempting to cross for approximately an hour, the procedure was  stopped, and using Angio-Seal, we had satisfactory closure.  She was  transferred back to holding area in stable condition.      Colleen Can. Deborah Chalk, M.D.  Electronically Signed     SNT/MEDQ  D:  09/08/2007  T:  09/09/2007  Job:  [id number]

## 2010-06-21 ENCOUNTER — Other Ambulatory Visit: Payer: Self-pay | Admitting: Oncology

## 2010-06-21 ENCOUNTER — Encounter (HOSPITAL_BASED_OUTPATIENT_CLINIC_OR_DEPARTMENT_OTHER): Payer: Medicare Other | Admitting: Oncology

## 2010-06-21 DIAGNOSIS — C569 Malignant neoplasm of unspecified ovary: Secondary | ICD-10-CM

## 2010-06-21 LAB — CBC WITH DIFFERENTIAL/PLATELET
Eosinophils Absolute: 0.2 10*3/uL (ref 0.0–0.5)
HCT: 37.4 % (ref 34.8–46.6)
LYMPH%: 21.4 % (ref 14.0–49.7)
MCHC: 34 g/dL (ref 31.5–36.0)
MCV: 87.2 fL (ref 79.5–101.0)
MONO#: 0.6 10*3/uL (ref 0.1–0.9)
MONO%: 7.4 % (ref 0.0–14.0)
NEUT#: 5.2 10*3/uL (ref 1.5–6.5)
NEUT%: 67.6 % (ref 38.4–76.8)
Platelets: 269 10*3/uL (ref 145–400)
WBC: 7.6 10*3/uL (ref 3.9–10.3)

## 2010-06-21 LAB — COMPREHENSIVE METABOLIC PANEL
CO2: 25 mEq/L (ref 19–32)
Creatinine, Ser: 0.93 mg/dL (ref 0.40–1.20)
Glucose, Bld: 96 mg/dL (ref 70–99)
Total Bilirubin: 0.7 mg/dL (ref 0.3–1.2)

## 2010-06-21 NOTE — Op Note (Signed)
[name], [name]               ACCOUNT NO.:  [id number]   MEDICAL RECORD NO.:  [id number]          PATIENT TYPE:  INP   LOCATION:  1610                         FACILITY:  [facility]   PHYSICIAN:  Sharlet Salina T. Hoxworth, M.D.DATE OF BIRTH:  March 12, 1930   DATE OF PROCEDURE:  10/01/2005  DATE OF DISCHARGE:                                 OPERATIVE REPORT   POSTOPERATIVE DIAGNOSES:  1. Recurrent granulosa cell tumor, left pelvis and right abdominal wall.  2. Recurrent ventral incisional hernia.   SURGICAL PROCEDURES:  1. Excision of granulosa cell tumor, right abdominal wall and left pelvis.  2. Repair of recurrent ventral hernia with Kugel mesh.   SURGEON:  Dr. Johna Sheriff.   ASSISTANT:  Dr. Marca Ancona.   HISTORY OF PRESENT ILLNESS:  Patty [name] is a 77 year old female with a  history of granulosa tumor of the ovary.  She has had recurrent tumor in the  small bowel, resected at the time of a concurrent colectomy for  diverticulosis in 2004.  She had an initial TAH-BSO in 2000 for this.  She  also developed a ventral hernia following her previous laparotomy in 2004,  which was fixed laparoscopically in 2005.  She now presents with recurrent  tumor on follow-up CT and PET, biopsy proven, with approximately 5 cm mass  in the right lower abdominal wall, deep to the fascia in the rectus sheath,  and another approximately 4 cm tumor along the left pelvic sidewall near,  but not apparently involving, the iliac vessels.  In addition, she has a  small recurrent incisional hernia in the suprapubic area.  After thorough  preoperative workup and discussion, we elected to proceed with resection of  both of these recurrent masses if possible, as well as repair of her  recurrent hernia.  The nature of the procedure, its indications, risks of  bleeding, infection, recurrent hernia were discussed and understood.  She is  now brought to the operating room for this procedure.   DESCRIPTION OF  OPERATION:  Patient was brought to the operating room and  placed in supine position on the operating room table, and general  endotracheal anesthesia was induced.  She had undergone a mechanical  antibiotic bowel prep preoperatively.  Preoperative IV antibiotics were  given.  PAS were in place.  The abdomen was widely sterilely prepped and  draped, after placement of the Foley.  Correct patient and procedure were  verified.  The previous low midline incision was used, and dissection was  carried down through subcutaneous tissue, and carried down onto the  previously placed intra-abdominal polyester mesh.  The mesh was opened into  free abdominal cavity superiorly.  The incision was incrementally extended  inferiorly, dissecting adhesions of small bowel and omentum off the mesh.  There were extensive adhesions, but for the most part they were not dense or  difficult.  The incision was extended down to the suprapubic area, and there  was about a 2 cm recurrent hernia just to the right off the lateral edge of  the mesh.  The mesh was then excised from the abdominal wall  and numerous  adhesions of small bowel and omentum were taken down off the mesh as part of  the excision, with the dissection taking about an hour.  There were no bowel  injuries.  After the mesh was excised and further adhesions were taken down,  the pelvis had interlooped small bowel adhesions and the abdominal cavity  completely freed.  There was a soft good-sized palpable mass, about 5 or 6  cm, bulging into the peritoneum on the right lower abdominal wall.  Using  cautery, I made the incision in the peritoneum just medial to the mass, and  then it was completely grossly resected.  This extended through the  peritoneum, into the rectus muscle, but did not involve the anterior fascia.  This mass was completely grossly resected.  Following this, the left colon  was mobilized with adhesions along the lateral pelvic sidewall,  and the left  pelvic mass easily palpable about 4 cm, lying just anterior to the iliac  artery and vein.  Peritoneum anterior and medial to the mass was incised and  the dissection carried out anterior and laterally, mobilizing the anterior-  lateral portions of mass.  Superiorly dissection was carried down onto the  iliac vessels, which then were clearly identified and protected, and the  mass was dissected up off of the iliac artery and vein.  It was not adherent  to these.  This came down to detachment of what appeared to be a normal  lymph node that was then excised with clips from the iliac vessels and the  mass completely removed.  There was a fair amount of scarring in this area  from the patient's previous surgery, and the ureter was not clearly  identified during this dissection, although we felt very comfortable that  the ureter was not in the field of dissection, and laid more posteriorly, as  was indicated clearly on the preoperative CT scan.  I did give the patient  methylene blue, as an additional precaution, and there was no evidence of  extravasation.  I did not attempt to dissect the ureter out due to the  scarring in the area from the patient's previous colectomy and hysterectomy.  Complete hemostasis was assured.  The viscera returned to the anatomic  position.  A 17 x 13 piece of Kugel mesh was used and was placed intra-  abdominally, and several 0 Prolene sutures were used to secure the ring of  the mesh up to the anterior abdominal wall fascia, anchoring it initially to  the fascia just at the pubis with the mesh overlapping down onto the pubis a  little bit, and then of several more sutures placed out laterally in the  lower abdominal wall, securing the mesh.  Following this, the Endo tacker  was used to circumferentially secure the mesh in all directions widely covering the incision, and particularly the previous area of weakness in the  low midline.  Following this,  the midline fascia was closed over the mesh,  incorporating it with a few bites using running #1 Novofil, begun at either  end of the incision and tied centrally.  Subcutaneous tissue was irrigated  and skin closed with staples.  Sponge and needle counts were correct.  Dry  sterile dressings were applied and the patient taken to recovery in good  condition.      Lorne Skeens. Hoxworth, M.D.  Electronically Signed     BTH/MEDQ  D:  10/01/2005  T:  10/01/2005  Job:  [id number]   cc:   Pershing Cox, M.D.  Fax: [phone]   Lennis P. Darrold Span, M.D.  Fax: [phone]

## 2010-06-21 NOTE — Op Note (Signed)
NAME:  Patty [name], Patty [name]                         ACCOUNT NO.:  [id number]   MEDICAL RECORD NO.:  [id number]                   PATIENT TYPE:  INP   LOCATION:  0005                                 FACILITY:  [facility]   PHYSICIAN:  Sharlet Salina T. Hoxworth, M.D.          DATE OF BIRTH:  1930-10-16   DATE OF PROCEDURE:  12/06/2002  DATE OF DISCHARGE:                                 OPERATIVE REPORT   PREOPERATIVE DIAGNOSES:  1. Recurrent granulosa cell ovarian tumor, abdominal cavity, x2.  2. Sigmoid diverticulitis.   SURGICAL PROCEDURES:  1. Small bowel resection for recurrent granulosa cell tumor.  2. Excision of recurrent granulosa cell tumor, suprapubic area of abdomen.  3. Sigmoid colectomy.   SURGEON:  Lorne Skeens. Hoxworth, M.D.   ASSISTANT:  Pershing Cox, M.D.   ANESTHESIA:  General.   BRIEF HISTORY:  Patty [name] is a 77 year old white female with a history  of TAH-BSO for stage I granulosa cell tumor of the ovary in January 2000.  She recently presented with an episode of acute severe lower abdominal pain  and CT scan revealed significant acute sigmoid diverticulitis.  Also found,  however, were two apparent areas of pelvic recurrence of soft tissue tumor,  one in the mesentery of the bowel in the pelvis and the other in the  retropubic area just anterior to the bladder.  The latter area has been  needle-biopsied showing recurrent granulosa cell tumor.  I was asked to see  the patient for excision of the two areas of recurrent granulosa cell tumor.  Also, after discussion with patient due to her severe episode of acute  diverticulitis, we have elected to proceed with elective resection of her  sigmoid colon.  The indication for the procedures, risks of bleeding,  infection, anastomotic leak, cardiorespiratory complications, were discussed  and understood.  She is now brought to the operating room for the procedure.   DESCRIPTION OPERATION:  The patient had undergone a  mechanical and  antibiotic bowel prep at home.  She received preoperative antibiotics and  Lovenox.  She was brought to the operating room, placed in the supine  position on the operating table, and general endotracheal anesthesia was  induced.  The abdomen was widely sterilely prepped and draped.  The previous  low midline incision was used and dissection carried down through the  subcutaneous tissue and midline fascia and the peritoneum entered under  direct vision.  There were remarkably few adhesions, just a few adhesions of  a small amount of remaining omentum and small bowel to the anterior  abdominal wall that were completely lysed.  Initial exploration did show  evidence of significant thickening and chronic inflammatory change of the  sigmoid colon, which was adherent to the left pelvic sidewall.  Stomach,  duodenum, liver, diaphragm carefully examined.  The small bowel and  mesentery were then examined proximally to distally and in the ileum was  found  to be a completely free, mobile 5-6 cm dark lobulated mass attached to  the wall of the ileum but otherwise completely unattached to any other  structures.  This clearly appeared to be one of the two areas of tumor seen  on CT scan.  At this point we proceeded with a limited small bowel resection  to get clean margins.  The mesentery and the segment of small bowel was  divided between clamps and tied with 3-0 silk ties.  An anastomosis was then  created, functional end-to-end, with the GIA stapler and the common  enterotomy closed and the specimen of small bowel and tumor removed with a  single firing of the TA-60 stapler.  The mesenteric defect was closed with  interrupted silk.  The anastomosis appeared widely patent and under no  tension, with good blood supply.  The remainder of the small bowel was  completely normal.  It was packed into the upper abdomen.  At this point  exploration along the suprapubic area revealed a second  similar-appearing  but smaller mass about 2-3 cm in diameter lying in the preperitoneal tissue  just beneath the fascia a centimeter or two above the pubic symphysis on the  right side.  This was excised with a grossly normal rim of soft tissue  around this and completely removed with cautery.  Attention was then turned  to the sigmoid colon.  There were extensive inflammatory adhesions to both  sidewalls of a long, redundant, thickened, chronically inflamed sigmoid  colon.  These were carefully mobilized with cautery and sharp dissection  until the sigmoid was completely free.  The peritoneal attachments were  bilaterally freeing the distal left colon and sigmoid colon.  The distal  left colon appeared soft and relatively normal, as did the lower  rectosigmoid.  Points of proximal and distal resection were chosen in these  two areas.  The proximal bowel was cleaned of pericolic fat and mesentery  and divided between Kocher and Allen clamps.  The mesentery in the vault  segment was then sequentially divided between clamps and tied with 2-0 silk  ties, doubly tying larger vessels.  I stayed close to the bowel wall away  from the ureters.  The peritoneum was thickened due to chronic inflammation  here, and I did not dissect the ureters out.  Dissection was carried  distally right along the bowel, dividing the mesentery down to soft and  normal distal rectosigmoid, at which point it was cleaned of mesentery and  pericolic fat and divided and the specimen removed.  Following this a single-  layer anastomosis was created with interrupted full-thickness 2-0 silk  suture circumferentially.  There was no tension, a good blood supply, and  what appeared to be a widely patent anastomosis.  Following this, the  abdomen was irrigated.  All gloves and instruments were changed.  Operative  sites were inspected for hemostasis.  The viscera were returned to their anatomic position.  The midline fascia and  peritoneum were closed with a  running #1 PDS beginning at either end of the incision and tied centrally.  The subcutaneous tissue was irrigated with antibiotic solution and the skin  closed with staples.  The sponge, needle, and instrument counts were  correct.  Dry sterile dressings were applied and the patient taken to  recovery in good condition.  Lorne Skeens. Hoxworth, M.D.    Tory Emerald  D:  12/06/2002  T:  12/06/2002  Job:  [id number]

## 2010-06-21 NOTE — Op Note (Signed)
NAME:  Patty [name], Patty [name]               ACCOUNT NO.:  [id number]   MEDICAL RECORD NO.:  [id number]          PATIENT TYPE:  AMB   LOCATION:  DAY                          FACILITY:  [facility]   PHYSICIAN:  Sharlet Salina T. Hoxworth, M.D.DATE OF BIRTH:  October 30, 1930   DATE OF PROCEDURE:  11/28/2003  DATE OF DISCHARGE:                                 OPERATIVE REPORT   PREOPERATIVE DIAGNOSES:  Ventral incisional hernia.   POSTOPERATIVE DIAGNOSES:  Ventral incisional hernia.   PROCEDURE:  Laparoscopic repair of ventral incisional hernia.   SURGEON:  Lorne Skeens. Hoxworth, M.D.   ANESTHESIA:  General.   BRIEF HISTORY:  Ms. Zumstein is a 77 year old female who has had several  laparotomies through a low midline incision for granulosis cell tumor. She  now presents with a gradually enlarging symptomatic discreet ventral  incisional hernia at the very lowest portion of her midline incision in the  suprapubic area.  Due to increasing symptoms and enlargement, repair has  been recommended and accepted. Options including open laparoscopic repair  have been discussed and we have elected to proceed with laparoscopic repair.  The nature of the procedure, indications, risks of bleeding, infection,  recurrence and bowel injury were discussed and understood. She is now  brought to the operating room for this procedure.   DESCRIPTION OF PROCEDURE:  The patient was brought to the operating room,  placed in supine position on the operating table and general endotracheal  anesthesia was induced. She received preoperative antibiotics.  The Foley  catheter was placed. The abdomen was widely sterilely prepped and draped  with Ioban drape. The abdomen was accessed with an Optiview 11 mm trocar in  the left lateral abdomen without difficulty and pneumoperitoneum  established. There were a moderate number of small bowel adhesions to the  low midline incision. A 5 mm trocar was placed in the left lower quadrant  under  direct vision.  Using careful sharp dissection, a number of loops of  small bowel were taken down from the anterior abdominal wall.  These  adhesions were filmy and not unusually difficult. All small bowel was  completely cleared from the anterior abdominal wall. She had had an  omentectomy and there was a small amount of omentum adherent in the upper  abdomen but this was well above the area of the incisional hernia and these  were left in place. There was a discreet approximately 5 cm hernia defect in  the suprapubic area. To allow some fixation of the mesh inferiorly, I then  incised the peritoneum along the pubic ramus and found Cooper's ligament  slightly and dissected into the retroperitoneum with the harmonic scalpel  and the bladder was dissected way posteriorly off the pubis and the  preperitoneal space entered here for several centimeters to allow fixation  of the mesh. There was a small rim of fascia just above the pubis as well  for fixation.  When this had been cleared, a piece of Parietex dual mesh  measuring 15 x 20 cm was chosen.  Five #0 Novofil stay sutures were placed  along the sides and  top edge of the mesh leaving the bottom edge free for  overlap over the pubis and down into the pelvis for several centimeters. The  mesh was moistened, rolled, introduced in the abdomen, unfurled and  oriented.  Through five small stab incisions corresponding to the suture  placements with the lowest two being in the extreme left and right lower  quadrant as far inferiorly as possible to still have clear access to the  peritoneal cavity, the five sutures were brought out through the anterior  abdominal wall and secured. This nicely unfurled the mesh over the anterior  abdominal wall with good overlap in all directions.  The inferior end of the  mesh was then placed over the pubis and tacked in place here with the  endotacker and along the small rim of fascia just above the pubis with  good  fixation. There was another couple of centimeters of mesh that was then  tucked down into the preperitoneal space anterior to the bladder just  posterior to the pubis.  Then along the remainder of the periphery of the  mesh through the anterior abdominal wall, the mesh was tacked  circumferentially. This appeared to provide good broad coverage and nice  fixation circumferentially.  The abdomen was inspected for hemostasis which  appeared complete. All CO2 was evacuated and the trocars removed. The skin  incisions were closed with interrupted subcuticular 4-0 Monocryl and Steri-  Strips. Sponge, needle and instrument counts were correct. Dry sterile  dressings were applied and the patient taken to recovery in good condition.      BTH/MEDQ  D:  11/28/2003  T:  11/28/2003  Job:  [id number]

## 2010-06-21 NOTE — Discharge Summary (Signed)
Patty [name], Patty [name]               ACCOUNT NO.:  [id number]   MEDICAL RECORD NO.:  [id number]          PATIENT TYPE:  INP   LOCATION:  1610                         FACILITY:  [facility]   PHYSICIAN:  Sharlet Salina T. Hoxworth, M.D.DATE OF BIRTH:  May 30, 1930   DATE OF ADMISSION:  10/01/2005  DATE OF DISCHARGE:  10/06/2005                               DISCHARGE SUMMARY   DISCHARGE DIAGNOSES:  1. Recurrent granulosa cell tumor abdominal wall and pelvis.  2. Ventral incisional hernia.   SURGICAL PROCEDURES:  1. Resection of recurrent granulosa cell tumor left pelvis, right      abdominal wall.  2. Repair of ventral hernia with mesh, October 01, 2005, Dr. Johna Sheriff.   BRIEF HISTORY:  Patty [name] is a 77 year old female with a history of  granulosa cell tumor of the ovary.  She has had recurrent tumor in the  small bowel, resected at the time of a concurrent colectomy for  diverticulosis in 2004.  She has history of TAH/BSO in 2000 for this  tumor.  She has history of a subsequent ventral hernia following her  laparotomy in 2004, which was repaired laparoscopically in 2005.  The  patient now presents with evidence of recurrent granulosa cell tumor on  CT and PET, and also biopsy proven, with a 5-cm mass in the right lower  abdominal wall rectus sheath, another 4-cm mass along the left pelvic  side wall.  She also has a moderate-sized recurrent lower abdominal  ventral hernia.  Following discussion in the office, the patient was  admitted for a resection of two areas of granulosa cell tumor and repair  of a ventral hernia.   PAST MEDICAL HISTORY:  Surgery is significant as above.  She also has a  history of previous diverticulitis, coronary artery disease, status post  MI and angioplasty in 2000, hypertension, elevated cholesterol.  Also  had laparoscopic cholecystectomy and knee arthroscopy.   MEDICATIONS ON ADMISSION:  1. Toprol 50 mg twice daily.  2. Hydrochlorothiazide 25 mg daily.  3.  Lisinopril 40 mg every morning.  4. Caduet 10/20 once daily.  5. Plavix 75 mg daily.  6. Isosorbide ER 60 mg daily.  7. Aspirin 325 daily, stopped ahead of time.   ALLERGIES:  NONE.   SOCIAL HISTORY, FAMILY HISTORY, REVIEW OF SYSTEMS:  See H&P.   PERTINENT PHYSICAL EXAMINATION:  This is a well-developed female in no  acute distress.  Abdominal exam significant for a reducible lower  abdominal incisional hernia.  No palpable abdominal mass.   HOSPITAL COURSE:  The patient was admitted on the morning of her  procedure.  She underwent resection of a mass along the left pelvic side  wall and the right abdominal wall with complete gross resection.  Her  ventral hernia was repaired with an intra-abdominal Kugel mesh patch.  Her postoperative course was smooth.  She had a low-grade temperature of  about 100 that resolved with pulmonary toilet.  Her diet was started  with clear liquids on the first postoperative day, and was able to be  advanced without difficulty.  Pain control steadily improved.  She  was  switched to oral pain medications.  Wound was healing primarily without  infection.  She was discharged home on October 06, 2005.  Discharge  medications are the same as admission plus p.o. morphine for pain.  Followup is to be in my office in one week for staple removal.      Sharlet Salina T. Hoxworth, M.D.  Electronically Signed     BTH/MEDQ  D:  11/18/2005  T:  11/19/2005  Job:  [id number]   cc:   Lennis P. Darrold Span, M.D.  Fax: [phone]   Pershing Cox, M.D.  Fax: [phone]

## 2010-06-21 NOTE — Discharge Summary (Signed)
NAME:  Patty [name], Patty [name]                         ACCOUNT NO.:  [id number]   MEDICAL RECORD NO.:  [id number]                   PATIENT TYPE:  INP   LOCATION:  0463                                 FACILITY:  [facility]   PHYSICIAN:  Sharlet Salina T. Hoxworth, M.D.          DATE OF BIRTH:  Oct 27, 1930   DATE OF ADMISSION:  12/06/2002  DATE OF DISCHARGE:  12/13/2002                                 DISCHARGE SUMMARY   DISCHARGE DIAGNOSES:  1. 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  2. Sigmoid diverticulitis.   SURGICAL PROCEDURES:  1. Small bowel resection of recurrent granulosis cell tumor.  2. Excision of recurrent granulosis cell tumor, suprapubic, abdominal, and     preperitoneum.  3. Sigmoid colectomy.   HISTORY OF PRESENT ILLNESS:  Patty [name] is a 77 year old white female who  has been followed by Pershing Cox, M.D., and Theressa Millard, M.D., with  a pertinent history of ruptured ovarian mass with abdominal bleeding, status  post TAH and BSO in January of 2000 with findings of a stage 1 granulosis  cell tumor of the left ovary.  In July of this year, she developed lower  abdominal pain and was admitted with CT findings of significant acute  sigmoid diverticulitis.  This responded to IV antibiotics, but also noted  were two areas of apparent pelvic recurrence of soft tissue tumor in the  mesentery and the retropubic area with preperitoneal fat.  Fine needle  aspirate of the latter lesion has recurrent granulosis cell tumor.  After  extensive consultation with Dr. Carey Bullocks, Dr. Darrold Span, and myself, we had  elected to proceed with abdominal exploration for excision or debulking of  her recurrent granulosis cell tumor.  Due to her severe episode of acute  sigmoid diverticulitis, we will plan sigmoid colectomy as well.  She is now  admitted for these procedures.   PAST MEDICAL HISTORY:  As above.  Also, coronary artery disease, status post  MI with angioplasty and three stents in  2000.  Recent stress test negative.  Also treated with hypertension and elevated cholesterol.  Other surgical  history includes laparoscopic cholecystectomy in 1993 and knee arthroscopy.   MEDICATIONS ON ADMISSION:  1. Toprol 50 mg daily.  2. Pravachol 20 mg daily.  3. Norvasc 10 mg daily.  4. Zestril 40 mg a day.  5. Hydrochlorothiazide 25 mg daily.  6. Premarin 0.625 mg daily.  7. Enteric-coated aspirin daily.   ALLERGIES:  No drug allergies.   SOCIAL HISTORY:  See the admission H&P.   FAMILY HISTORY:  See the admission H&P.   REVIEW OF SYSTEMS:  See the admission H&P.   PHYSICAL EXAMINATION:  She is 5 feet and 168 pounds.  Vital signs all within  normal limits.  Surgical findings are limited to the abdomen which showed a  well-healed low midline incision.  Soft and nontender.  No palpable masses  or hepatosplenomegaly.   HOSPITAL COURSE:  The patient underwent a mechanical antibiotic bowel prep  at home and was admitted on the morning of her procedure.  On exploration,  she was found to have two areas of recurrent tumor, one on the small bowel  in the mid ileum resected with a small bowel resection and another area  occurring just above the pubic symphysis in the preperitoneal fat which was  excised.  There was no other evidence of tumor.  The patient also underwent  sigmoid colectomy for extensive diverticulosis and a history of severe  diverticulitis.  Her postoperative course was quite smooth.  She was stable  and comfortable on the first postoperative day and was started on a clear  liquid diet.  She was hungry on the second postoperative day and was  advanced to diet as tolerated.  She did have some moderate bloating and  distention over the next couple of days and self-limited her diet.  By  December 11, 2002, however, she began passing flatus and abdominal distention  resolved.  By December 12, 2002, she had had bowel movements and flatus.  She  was ambulatory.  She  was felt ready for discharge by December 13, 2002.  Her  wound healed primarily.  The final pathology showed metastatic granulosis  cell tumor associated with small intestinal resection and a suprapubic  nodule and diverticulosis and diverticulitis of the sigmoid colon.   DISCHARGE MEDICATIONS:  1. Toprol 50 mg daily.  2. Pravachol 20 mg daily.  3. Norvasc 10 mg daily.  4. Zestril 40 mg a day.  5. Hydrochlorothiazide 25 mg daily.  6. Premarin 0.625 mg daily.  7. Enteric-coated aspirin daily.  8. Mepergan fortis p.r.n. for pain.   FOLLOWUP:  Followup is to be in my office in one to two weeks.                                               Lorne Skeens. Hoxworth, M.D.    Tory Emerald  D:  01/16/2003  T:  01/16/2003  Job:  [id number]   cc:   Theressa Millard, M.D.  [address] [place]  [place] [address]  Fax: [phone]   Tasia Catchings, M.D.  [address]  [address]  [place]  [place] [address]  Fax: [phone]   Pershing Cox, M.D.  [address]  [address]  [place]  [place] [address]  Fax: [phone]

## 2010-06-21 NOTE — H&P (Signed)
[name], [name]               ACCOUNT NO.:  [id number]   MEDICAL RECORD NO.:  [id number]          PATIENT TYPE:  OIB   LOCATION:                               FACILITY:  [facility]   PHYSICIAN:  Colleen Can. Deborah Chalk, M.D.DATE OF BIRTH:  15-Sep-1930   DATE OF ADMISSION:  11/01/2003  DATE OF DISCHARGE:                                HISTORY & PHYSICAL   CHIEF COMPLAINT:  Chest tightness.   HISTORY OF PRESENT ILLNESS:  The patient is a very pleasant 77 year old  female who has multiple medical problems.  She had previous non-Q-wave MI  that dates back to May 2000.  At that time, she had stents x 2 to the LAD as  well as x 1 to the left circumflex.  She has basically done well from a  cardiovascular standpoint since that time.   She presents to our office as a work-in appointment on October 30, 2003.  She notes that over the past 10 to perhaps 14 days, she has basically had a  multitude of somatic complaints which she describes as red flags.  This  primarily started out with a feeling of being lightheaded.  She feels like  her head is full of air.  She has had no frank syncope.  She has also had  episodes where she has felt her heart fluttering.  She has had no frank  syncope.  She has become more fatigued over the past 10 days or so.  This  past weekend while [place] was [place] the [place] on [place], she began to  have midsternal chest tightness that was exertional in nature after walking  up hills.  There was  some diaphoresis in association.  She had no nausea.  The symptoms lasted for approximately 5 to 10 minutes and then subsided.  She has felt subsequently lifeless.  Today when she awoke she felt very  diaphoretic but had no further discomfort.  Her pain is different from her  previous chest pain syndrome.  In light of her multiple somatic complaints  as well as need for cardiac clearance for upcoming hernia surgery under  general anesthesia, she is now referred for elective  cardiac  catheterization.   PAST MEDICAL HISTORY:  1.  Atherosclerotic cardiovascular disease with previous history of an MI in      May 2000, treated with angioplasty and stenting of the LAD x 1 and x 1      in the circumflex.  This procedure was complicated by a right groin      pseudoaneurysm.  2.  Hypertension.  3.  Hyperlipidemia.  4.  History of remote tobacco abuse.  5.  History of granulosa cell tumor.  She has had surgery x 2 and is      followed by Dr. Carey Bullocks and Dr. Darrold Span.  6.  History of hysterectomy and bilateral salpingo-oophorectomy.  7.  Laparoscopic cholecystectomy.  8.  Varicose vein surgery.  9.  Chronic knee pain.  10. Obesity.  11. History of diverticulitis.   ALLERGIES:  None.   CURRENT MEDICATIONS:  1.  Lipitor 40  mg a day.  2.  Aspirin daily.  3.  Calcium twice a day.  4.  Multivitamins daily.  5.  Vitamin C and E daily.  6.  Coenzyme Q10 daily.  7.  Lopressor 25 mg b.i.d.  8.  Hydrochlorothiazide daily.  9.  Zestril 40 mg daily.  10. Norvasc 10 mg daily.   FAMILY HISTORY:  Father died at 52 with a heart attack.  Mother died at age  2.  She has three children.  One of her daughters has just recently  divorced.  She has a son-in-law who will be incarcerated for cocaine use.   SOCIAL HISTORY:  She is a Veterinary surgeon.  She has had no tobacco products for over  25 years.  She has social alcohol.  She lives at home with her husband.   REVIEW OF SYSTEMS:  Basically as noted above and otherwise unremarkable.   PHYSICAL EXAMINATION:  GENERAL:  She is a very pleasant, elderly female who  appears younger than her stated age.  VITAL SIGNS:  Weight is 171 pounds, blood pressure 150/60 sitting, 130/50  standing, heart rate 64 with occasional ectopic.  Respirations 18.  She is  afebrile.  SKIN:  Warm and dry.  Color is unremarkable.  LUNGS:  Clear.  HEART:  Regular rhythm.  ABDOMEN:  Soft, positive bowel sounds, nontender.  EXTREMITIES: Without edema.   NEUROLOGIC:  Intact.   LABORATORY AND X-RAY DATA:  Pertinent labs are pending.   OVERALL IMPRESSION:  1.  Chest tightness, exertional in nature.  2.  Known atherosclerotic cardiovascular disease, remote history of      myocardial infarction with stent x 3 (2 to the LAD and 1 to the left      circumflex).  3.  Obesity.  4.  Hypertension, currently uncontrolled.  5.  Hyperlipidemia.  6.  History of granulosa cell tumor with previous surgical intervention x 2.   PLAN:  We will proceed on with cardiac catheterization.  Procedure has been  discussed in full detail, and she is willing to proceed on Wednesday,  November 01, 2003.       ________________________________________  Sharlee Blew, N.P.  ___________________________________________  Colleen Can. Deborah Chalk, M.D.    LC/MEDQ  D:  10/30/2003  T:  10/30/2003  Job:  [id number]   cc:   Theressa Millard, M.D.  [address] [place]  [place] [address]  Fax: [phone]

## 2010-06-21 NOTE — Discharge Summary (Signed)
NAMESHUNTIA, EXTON               ACCOUNT NO.:  [id number]   MEDICAL RECORD NO.:  [id number]          PATIENT TYPE:  INP   LOCATION:  0445                         FACILITY:  [facility]   PHYSICIAN:  Sharlet Salina T. Hoxworth, M.D.DATE OF BIRTH:  08-10-1930   DATE OF ADMISSION:  11/28/2003  DATE OF DISCHARGE:  11/30/2003                                 DISCHARGE SUMMARY   DISCHARGE DIAGNOSIS:  Ventral incisional hernia.   SURGICAL PROCEDURES:  Laparoscopic repair of ventral incisional hernia by  Dr. Johna Sheriff.   HISTORY OF PRESENT ILLNESS:  Ms. Jaso is a 77 year old female with a  history of metastatic granulosis cell tumor requiring sigmoid colectomy,  small bowel resection in 2004. She was status post previous hysterectomy.  Also underwent sigmoid colectomy for diverticulitis. The patient has done  extremely well following this surgery, and there has no evidence of  recurrence on followup. She has, however, developed an increasingly large  painless bulge in the lower part of the incision, and exam confirms ventral  incisional hernia. She is admitted for elective laparoscopic repair.   PAST MEDICAL HISTORY:  As mentioned above. Also laparoscopic cholecystectomy  and knee arthroscopy. Has a previous history of diverticulitis, coronary  artery disease status post MI and angioplasty with three stents in 2000.  Also history of hypertension, elevated cholesterol.   MEDICATIONS:  1.  Metoprolol 50 mg b.i.d.  2.  Pravachol 20 mg daily.  3.  Norvasc 10 mg daily.  4.  Zestril 40 a day.  5.  Hydrochlorothiazide 25 daily.   ALLERGIES:  No allergies.   SOCIAL HISTORY, FAMILY HISTORY, REVIEW OF SYSTEMS:  See detailed H&P.   PERTINENT PHYSICAL EXAM:  VITAL SIGNS:  Within normal limits.  ABDOMEN:  Pertinent exam is limited to the abdomen which revealed a moderate  size low midline incisional hernia.   HOSPITAL COURSE:  The patient was admitted the morning of her procedure and  underwent an  uneventful laparoscopic repair. On the first postoperative day,  she had some nausea and dizziness and pain when attempting to walk. Abdomen  was benign. On the second postoperative day, she was feeling significantly  better with just mild pain. She was ambulatory. Tolerating a diet. Wounds  healing nicely. She is ready for discharge at this time.   DISCHARGE MEDICATIONS:  Same as admission plus Vicodin or Tylenol as needed  for pain.   FOLLOW UP:  Followup will be in my office in 2 weeks.      BTH/MEDQ  D:  12/19/2003  T:  12/20/2003  Job:  [id number]   cc:   Pershing Cox, M.D.  [address]  [place]  [place] [address]  Fax: [phone]   Lennis P. Darrold Span, M.D.  [address] [facility]  [place]  [place] [address]  Fax: [phone]

## 2010-06-21 NOTE — H&P (Signed)
Patty [name], Patty [name]               ACCOUNT NO.:  [id number]   MEDICAL RECORD NO.:  [id number]          PATIENT TYPE:  INP   LOCATION:  1610                         FACILITY:  [facility]   PHYSICIAN:  Sharlet Salina T. Hoxworth, M.D.DATE OF BIRTH:  1930/08/21   DATE OF ADMISSION:  10/01/2005  DATE OF DISCHARGE:  10/06/2005                              HISTORY & PHYSICAL   CHIEF COMPLAINT:  Recurrent abdominal tumor and recurrent ventral  hernia.   HISTORY OF PRESENT ILLNESS:  Patty [name] has history of granulosa cell  tumor of the ovary with recurrence and has undergone small-bowel  resection as well as colectomy for diverticulosis in 2004.  Status post  previous TAH-BSO in 2000.  She subsequently developed the suprapubic  ventral hernia which I fixed laparoscopically in 2005.  She initially  did well, but recently developed a recurrent painless bulge in the  suprapubic area.  Also on follow-up CT scan by Dr. Darrold Span, she has two  new areas of questionable tumor mass in the posterior right rectus  muscle and in the left pelvis.  Needle biopsy has confirmed recurrent  granulosa cell tumor.  The patient is admitted for resection of these  two tumors and repair of her ventral hernia.   PAST MEDICAL HISTORY:  1. Significant as above plus coronary disease status post MI,      angioplasty and three stents in 2000.  2. History of hypertension.  3. Elevated cholesterol.  4. She has also had laparoscopic cholecystectomy in 1993 and knee      arthroscopy.   MEDICATIONS:  1. Metoprolol 50 mg daily.  2. Caduet one daily.  3. Zestril 40 mg daily.  4. Hydrochlorothiazide 25 mg daily.  5. Plavix daily.   ALLERGIES:  CODEINE and MORPHINE.   SOCIAL HISTORY:  No cigarette or alcohol.   FAMILY HISTORY:  Positive heart disease and stroke.   REVIEW OF SYSTEMS:  Unremarkable except as above.   PHYSICAL EXAMINATION:  VITAL SIGNS:  All within normal limits.  GENERAL:  Healthy-appearing, elderly white  female.  SKIN:  Warm and dry.  No rash or infection.  HEENT:  No masses, thyromegaly.  Sclerae nonicteric.  LYMPH NODES:  No cervical, subclavicular or inguinal nodes palpable.  LUNGS:  Clear to auscultation.  CARDIAC:  Regular rate and rhythm.  No murmurs.  No edema.  ABDOMEN:  Soft, nontender.  No palpable masses.  No organomegaly.  There  is a recurrent incisional hernia the suprapubic area.   ASSESSMENT/PLAN:  Recurrent suprapubic ventral hernia and recurrent  granulosa cell tumor in the abdominal wall and pelvis.  The patient is  admitted for resection of these tumors and repair of her incisional  hernia.      Lorne Skeens. Hoxworth, M.D.  Electronically Signed     BTH/MEDQ  D:  02/09/2006  T:  02/09/2006  Job:  [id number]

## 2010-06-21 NOTE — Cardiovascular Report (Signed)
NAMEOZZIE, REMMERS               ACCOUNT NO.:  [id number]   MEDICAL RECORD NO.:  [id number]          PATIENT TYPE:  OIB   LOCATION:  2899                         FACILITY:  [facility]   PHYSICIAN:  Colleen Can. Deborah Chalk, M.D.DATE OF BIRTH:  1930/05/10   DATE OF PROCEDURE:  11/01/2003  DATE OF DISCHARGE:  11/01/2003                              CARDIAC CATHETERIZATION   HISTORY:  Ms. Darnold has had previous stents in her left anterior  descending (two) and left circumflex (one) in May of 2000, has history of  remote myocardial infarction.  She is referred now for evaluation of  atypical chest pain.   PROCEDURE:  Left heart catheterization with selective coronary angiography,  left ventricular angiography.   TYPE AND SITE OF ENTRY:  Percutaneous left femoral artery.   CATHETERS:  A 6 French four curved Judkins right and left coronary catheter,  6 French pigtail ventriculographic catheter.   CONTRAST MATERIAL:  Omnipaque.   MEDICATIONS GIVEN PRIOR TO PROCEDURE:  Valium 10 mg p.o.   MEDICATIONS GIVEN DURING THE PROCEDURE:  Ancef 1 g IV.   COMMENTS:  The arteriotomy site was closed with Perclose closure device.   HEMODYNAMIC DATA:  The aortic pressure was 131/65, the LV was 147/1-14,  there was no aortic valve gradient noted on pullback.   ANGIOGRAPHIC DATA:  1.  Left main coronary artery:  Left main coronary artery had mild      irregularities.  2.  Left anterior descending:  The left anterior descending was relatively      long as it crossed the apex.  It was moderate in size. The stents placed      in the proximal left anterior descending were widely patent.  There was      moderate 40 to 60% narrowing in a diffuse manner predominantly in the      mid to distal section.  There was one focal area that may represent 70      to 80% narrowing distally.  There was diffuse disease at this location.      It was not well suited for angioplasty because of its small vessel size      and  diffuse disease.  3.  Left circumflex:  The left circumflex is a moderately large vessel with      two large obtuse marginal branches and then it continues onto the      posterolateral wall.  The stent in the body of the left circumflex is      widely patent.  The first obtuse marginal is diffusely diseased and has      a 70 to 80% area of focal narrowing but also is within a segmental      diffuse disease.  It would not be well suited for angioplasty because of      the diffuseness of the distal disease.  The second obtuse marginal has a      60 to 70% ostial narrowing.  There is diffuse disease distally.  The      vessel is somewhat tortuous.  The termination of the left circumflex  ends on the posterolateral wall.  It has diffuse disease but is free of      significant obstructive disease.  4.  Right coronary artery:  The right coronary artery is a small dominant      vessel.  It has mild diffuse 30 to 50% narrowing scattered throughout      without significant obstructive lesions.   Left ventricular angiogram is performed in the RAO position.  Overall  cardiac size and silhouette are normal.  The global ejection fraction is  70%, regional wall motion is normal.   OVERALL IMPRESSION:  1.  Normal left ventricular function.  2.  Moderately severe diffuse coronary atherosclerosis distally in the left      circumflex with moderate diffuse atherosclerosis in the left anterior      descending and right coronary artery beds.   DISCUSSION:  It is felt that the diffuseness of the disease will best be  managed medically with ongoing management of her cardiovascular risk  factors.  Percutaneous intervention is not felt to be necessary at this  point in time, nor would the vessels be ideally suited for that given their  anatomy.       SNT/MEDQ  D:  11/01/2003  T:  11/01/2003  Job:  [id number]   cc:   Theressa Millard, M.D.  [address] [place]  [place] [address]  Fax: [phone]

## 2010-06-21 NOTE — Op Note (Signed)
NAME:  Patty [name], Patty [name]               ACCOUNT NO.:  [id number]   MEDICAL RECORD NO.:  [id number]          PATIENT TYPE:  AMB   LOCATION:  DSC                          FACILITY:  [facility]   PHYSICIAN:  Katy Fitch. Sypher, M.D. DATE OF BIRTH:  01-07-1931   DATE OF PROCEDURE:  04/03/2006  DATE OF DISCHARGE:                               OPERATIVE REPORT   PREOPERATIVE DIAGNOSES:  1. Right carpal tunnel syndrome.  2. Stenosing tenosynovitis, right ring finger, at A1 pulley.   POSTOPERATIVE DIAGNOSES:  1. Right carpal tunnel syndrome.  2. Stenosing tenosynovitis, right ring finger, at A1 pulley.   OPERATION:  1. Release of right transverse carpal ligament.  2. Release of right ring finger A1 pulley.   OPERATING SURGEON:  Josephine Igo, MD   ASSISTANT:  Annye Rusk, PA-C.   ANESTHESIA:  IV regional at proximal forearm level.   SUPERVISING ANESTHESIOLOGIST:  Zenon Mayo, MD   INDICATIONS:  Patty [name] is a 77 year old woman referred through the  courtesy of Dr. Benjaman Kindler for evaluation and management of hand  numbness and trigger fingers.   Clinical examination confirmed significant right carpal tunnel syndrome  and stenosing tenosynovitis of the right ring finger.   Due to a failure to respond to nonoperative measures, she is brought to  the operating room at this time for release of her right ring finger A1  pulley and release of her right transverse carpal ligament.   PROCEDURE:  Arnelle Hailu was brought to the operating room and placed  in supine position upon the operating table.  Following light sedation,  an IV regional block was placed at the proximal forearm level on the  right.   The right arm was then prepped with Betadine soaping solution and  sterilely draped.  A pneumatic tourniquet on the proximal forearm was  inflated to 250 ultimately 300 mmHg due to mild bleed through deep to  the tourniquet.   When anesthesia was noted be satisfactory, the arm  was prepped with  Betadine soaping solution and sterilely draped.  The procedure commenced  with a short transverse incision directly over the A1 pulley of the ring  finger.  Subcutaneous tissues were carefully divided, taking care to  identify and release the pretendinous fibers of palmar fascia.  The A1  pulley was isolated and found to have a ganglion forming on the proximal  margin of the A1 pulley.  The ganglion was resected with a rongeur  followed by release of the pulley with scissors.  A small A0 pulley was  likewise identified and released.   Thereafter, free range of motion of the ring finger was recovered.   Attention was then directed to the proximal palm.  A short incision was  fashioned in line of the ring fingers.  Subcutaneous tissues were  carefully divided, revealing the palmar fascia.  This was split  longitudinally to reveal the common sensory branch of the median nerve.  These were followed back to the transverse carpal ligament, which was  gently isolated from the median nerve.  The ligament was then released  along its  ulnar border, extending into the distal forearm.  This widely  opened the carpal canal.  No mass or other predicaments were noted.   Bleeding points along the margin of the released ligament were  electrocauterized with bipolar current followed by repair of the skin  with intradermal 3-0 Prolene suture.   The separate incision for release of the A1 pulley was likewise repaired  with intradermal 3-0 Prolene suture.   Compressive dressings were applied with sterile gauze, sterile Webril  and a volar plaster splint to maintain the wrist in 5 degrees of  dorsiflexion.   There were no apparent complications.          Katy Fitch Sypher, M.D.  Electronically Signed     RVS/MEDQ  D:  04/03/2006  T:  04/03/2006  Job:  [id number]   cc:   Theressa Millard, M.D.

## 2010-06-21 NOTE — Discharge Summary (Signed)
NAME:  Patty [name], DATE                         ACCOUNT NO.:  [id number]   MEDICAL RECORD NO.:  [id number]                   PATIENT TYPE:  INP   LOCATION:  5708                                 FACILITY:  [facility]   PHYSICIAN:  Theressa Millard, M.D.                 DATE OF BIRTH:  December 22, 1930   DATE OF ADMISSION:  08/12/2002  DATE OF DISCHARGE:  08/15/2002                                 DISCHARGE SUMMARY   ADMISSION DIAGNOSIS:  Abdominal pain.   DISCHARGE DIAGNOSES:  1. Diverticulitis.  2. History of granulosa cell tumor with abnormal CT scan with possible     recurrence.  3. Hypokalemia, resolved.  4. Coronary artery disease.   HISTORY OF PRESENT ILLNESS:  The patient is a 77 year old white female who  was admitted with subacute abdominal pain after a couple of days' duration.   HOSPITAL COURSE:  The patient was admitted and CT scan revealed evidence of  left-sided diverticulitis.  There was also a partially enhancing mass within  the fat of the mesentery in the upper mid pelvis of 3.9 x 3.3 cm and another  lesion just above the pubic symphysis of 2.4 x 2.4 cm in size.  There is  concern that this is recurrence of her granulosa cell tumor.  She was  started on Cipro and Flagyl IV.  Over the subsequently 48 hours, she rapidly  improved and was changed to p.o. antibiotics on the day of discharge.  She  was observed through the day of discharge, did not worsen, and was  discharged in improved condition.  The abnormal CT will be followed up on by  a follow-up visit with Dr. Carey Bullocks.  Followup potassium was normal.   DISCHARGE MEDICATIONS:  1. Zestril 40 mg daily.  2. Premarin 0.3 mg daily.  3. Calcium as before.  4. Metoprolol 25 mg b.i.d.  5. Norvasc 10 mg daily.  6. Pravachol 20 mg nightly.  7. Vitamin E daily.  8. Vitamin C daily.  9. Multivitamins daily.  10.      Aspirin daily.  11.      Cipro 500 mg b.i.d. x 8 days (hold calcium while taking Cipro).  12.      Flagyl 500  mg t.i.d. x 8 days (warned to avoid alcohol while taking     Flagyl).   ACTIVITY:  No restrictions.   DIET:  Low-residue diet x 10 days and then resume high-fiber diet.  No  alcohol while taking Flagyl.    FOLLOWUP:  She will call and make an appointment to see me in approximately  six weeks.  She will call to make an appointment to see Dr. Carey Bullocks within  the next week or so.  Theressa Millard, M.D.    JO/MEDQ  D:  08/15/2002  T:  08/16/2002  Job:  [id number]

## 2010-08-02 ENCOUNTER — Emergency Department (HOSPITAL_COMMUNITY)
Admission: EM | Admit: 2010-08-02 | Discharge: 2010-08-02 | Disposition: A | Discharge status: Home / Self Care | Payer: Medicare Other | Attending: Emergency Medicine | Admitting: Emergency Medicine

## 2010-08-02 DIAGNOSIS — R0789 Other chest pain: Secondary | ICD-10-CM | POA: Insufficient documentation

## 2010-08-02 DIAGNOSIS — T7840XA Allergy, unspecified, initial encounter: Secondary | ICD-10-CM | POA: Insufficient documentation

## 2010-08-02 DIAGNOSIS — E7800 Pure hypercholesterolemia, unspecified: Secondary | ICD-10-CM | POA: Insufficient documentation

## 2010-08-02 DIAGNOSIS — I252 Old myocardial infarction: Secondary | ICD-10-CM | POA: Insufficient documentation

## 2010-08-02 DIAGNOSIS — I2510 Atherosclerotic heart disease of native coronary artery without angina pectoris: Secondary | ICD-10-CM | POA: Insufficient documentation

## 2010-08-02 DIAGNOSIS — R21 Rash and other nonspecific skin eruption: Secondary | ICD-10-CM | POA: Insufficient documentation

## 2010-08-02 DIAGNOSIS — I10 Essential (primary) hypertension: Secondary | ICD-10-CM | POA: Insufficient documentation

## 2010-09-19 ENCOUNTER — Encounter: Payer: Self-pay | Admitting: Nurse Practitioner

## 2010-10-03 ENCOUNTER — Encounter: Payer: Self-pay | Admitting: Nurse Practitioner

## 2010-10-03 ENCOUNTER — Ambulatory Visit (INDEPENDENT_AMBULATORY_CARE_PROVIDER_SITE_OTHER): Payer: Medicare Other | Admitting: *Deleted

## 2010-10-03 ENCOUNTER — Ambulatory Visit (INDEPENDENT_AMBULATORY_CARE_PROVIDER_SITE_OTHER): Payer: Medicare Other | Admitting: Nurse Practitioner

## 2010-10-03 VITALS — BP 134/70 | HR 58 | Ht 61.0 in | Wt 172.2 lb

## 2010-10-03 DIAGNOSIS — I2510 Atherosclerotic heart disease of native coronary artery without angina pectoris: Secondary | ICD-10-CM

## 2010-10-03 DIAGNOSIS — E785 Hyperlipidemia, unspecified: Secondary | ICD-10-CM | POA: Insufficient documentation

## 2010-10-03 DIAGNOSIS — E7800 Pure hypercholesterolemia, unspecified: Secondary | ICD-10-CM

## 2010-10-03 DIAGNOSIS — R5381 Other malaise: Secondary | ICD-10-CM

## 2010-10-03 DIAGNOSIS — I701 Atherosclerosis of renal artery: Secondary | ICD-10-CM | POA: Insufficient documentation

## 2010-10-03 DIAGNOSIS — I10 Essential (primary) hypertension: Secondary | ICD-10-CM | POA: Insufficient documentation

## 2010-10-03 DIAGNOSIS — E669 Obesity, unspecified: Secondary | ICD-10-CM | POA: Insufficient documentation

## 2010-10-03 DIAGNOSIS — R5383 Other fatigue: Secondary | ICD-10-CM

## 2010-10-03 LAB — BASIC METABOLIC PANEL
Calcium: 9.1 mg/dL (ref 8.4–10.5)
Chloride: 102 mEq/L (ref 96–112)
Creatinine, Ser: 0.9 mg/dL (ref 0.4–1.2)

## 2010-10-03 LAB — LIPID PANEL
HDL: 62.8 mg/dL (ref >39.00)
Total CHOL/HDL Ratio: 2
Triglycerides: 66 mg/dL (ref 0.0–149.0)
VLDL: 13.2 mg/dL (ref 0.0–40.0)

## 2010-10-03 LAB — HEPATIC FUNCTION PANEL
AST: 29 U/L (ref 0–37)
Albumin: 3.8 g/dL (ref 3.5–5.2)
Alkaline Phosphatase: 91 U/L (ref 39–117)
Total Protein: 6.2 g/dL (ref 6.0–8.3)

## 2010-10-03 NOTE — Assessment & Plan Note (Signed)
Blood pressure at home is excellent. No change in her medicines.

## 2010-10-03 NOTE — Assessment & Plan Note (Signed)
Labs are checked today.  

## 2010-10-03 NOTE — Assessment & Plan Note (Signed)
No labile blood pressures noted. We will see what her renal function looks like. She is to continue to monitor her blood pressure at home.

## 2010-10-03 NOTE — Patient Instructions (Signed)
Stay on your current medicines Continue to check your blood pressure. Let us know if you see a lot of fluctuation in your readings. We will see you back in 6 months. You will see Dr. Marca Ancona Call for any problems.

## 2010-10-03 NOTE — Assessment & Plan Note (Signed)
She continues to do well. Cardiovascular risk factor modification is encouraged. We will see her back in 6 months. She will see Dr Shirlee Latch on return. Patient is agreeable to this plan and will call if any problems develop in the interim.

## 2010-10-03 NOTE — Assessment & Plan Note (Signed)
Weight loss and exercise is encouraged.

## 2010-10-03 NOTE — Progress Notes (Signed)
Patty [name] Date of Birth: Apr 06, 1930   History of Present Illness: Patty [name] is seen today for her 6 month check. She is seen for Dr. Shirlee Latch. She is a former patient of Dr. Ronnald Nian. She is doing well from our standpoint. No chest pain or shortness of breath. She continues to swim. She is walking some. She is complaining of fatigue but thinks it is just from being over committed. She is tolerating her medicines. She was hospitalized back this past May with abdominal issues. She had a CT done. There was documented left RAS. Blood pressures have been fine at home. No labile readings. We will see what her labs show.   Current Outpatient Prescriptions on File Prior to Visit  Medication Sig Dispense Refill  . amLODipine-atorvastatin (CADUET) 10-80 MG per tablet Take 1 tablet by mouth daily.        Marland Kitchen aspirin 325 MG tablet Take 325 mg by mouth daily.        . Calcium Carbonate (CALTRATE 600 PO) Take by mouth 2 (two) times daily. Once a day 500      . clopidogrel (PLAVIX) 75 MG tablet Take 75 mg by mouth daily.        . Coenzyme Q10 (COQ10 PO) Take 120 mg by mouth daily.        . fish oil-omega-3 fatty acids 1000 MG capsule Take 2 g by mouth every other day.        . hydrochlorothiazide 25 MG tablet Take 25 mg by mouth daily.        . isosorbide mononitrate (IMDUR) 60 MG 24 hr tablet Take 60 mg by mouth daily.        Marland Kitchen lisinopril (PRINIVIL,ZESTRIL) 40 MG tablet Take 40 mg by mouth daily.        . metoprolol (LOPRESSOR) 50 MG tablet Take 50 mg by mouth 2 (two) times daily.        . Multiple Vitamin (MULTIVITAMIN) tablet Take 1 tablet by mouth daily.        . nitroGLYCERIN (NITROSTAT) 0.4 MG SL tablet Place 0.4 mg under the tongue every 5 (five) minutes as needed.        . vitamin C (ASCORBIC ACID) 500 MG tablet Take 500 mg by mouth as needed.       . vitamin E (VITAMIN E) 400 UNIT capsule Take 400 Units by mouth daily.          Allergies  Allergen Reactions  . Other     Pain  medications    Past Medical History  Diagnosis Date  . IHD (ischemic heart disease)     Remote MI in 2000 with stent to LAD and LCX. Failed  intervention to OM in 2009. Managed medically since that time.  . MI, old 80  . Hypertension   . Hyperlipidemia   . Tobacco abuse   . Obesity   . Granulosa cell tumor     Has had several surgeries due to her tumor  . Diverticulitis   . Stroke     Old CVA noted on MRI in June 2011    Past Surgical History  Procedure Date  . Cardiac catheterization 09/03/2007    EF 70%; Failed attempt at PCI to OM  . Cardiac catheterization 11/01/2003    EF 70%  . Cholecystectomy   . Parotid mass removed   . US echocardiography 11/21/2003    EF 55-60%  . Cardiovascular stress test 03/23/2009    EF  72%  . Coronary stent placement 2000    LAD and LCX    History  Smoking status  . Former Smoker  . Quit date: 02/03/1974  Smokeless tobacco  . Not on file    History  Alcohol Use No    Family History  Problem Relation Age of Onset  . Heart attack Father     Review of Systems: The review of systems is positive for fatigue. She will be having her oncology follow up here in the fall. No recurrent TIAs. No chest pain.  All other systems were reviewed and are negative.  Physical Exam: BP 134/70  Pulse 58  Ht 5\' 1"  (1.549 m)  Wt 172 lb 3.2 oz (78.109 kg)  BMI 32.54 kg/m2 Patient is very pleasant and in no acute distress. She is obese. Skin is warm and dry. Color is normal.  HEENT is unremarkable. Normocephalic/atraumatic. PERRL. Sclera are nonicteric. Neck is supple. No masses. No JVD. Lungs are clear. Cardiac exam shows a regular rate and rhythm. Abdomen is soft. Extremities are without edema. Gait and ROM are intact. No gross neurologic deficits noted.   LABORATORY DATA:   Assessment / Plan:

## 2010-10-03 NOTE — Assessment & Plan Note (Signed)
She feels she is over committed with other social and church obligations.

## 2010-10-08 NOTE — Progress Notes (Signed)
Agree with note.  Heer Justiss  

## 2010-10-11 ENCOUNTER — Encounter (HOSPITAL_BASED_OUTPATIENT_CLINIC_OR_DEPARTMENT_OTHER): Payer: Medicare Other | Admitting: Oncology

## 2010-10-11 DIAGNOSIS — C569 Malignant neoplasm of unspecified ovary: Secondary | ICD-10-CM

## 2010-11-01 LAB — BASIC METABOLIC PANEL
Calcium: 8.7
Chloride: 105
Creatinine, Ser: 0.66
GFR calc Af Amer: >60
GFR calc Af Amer: >60
GFR calc non Af Amer: >60
Potassium: 3.1 — ABNORMAL LOW
Sodium: 142

## 2010-11-01 LAB — PROTIME-INR: INR: 1

## 2010-11-01 LAB — CBC
Hemoglobin: 12.8
MCHC: 34.7
RBC: 4
RBC: 4.22
WBC: 5.5

## 2010-11-20 LAB — BASIC METABOLIC PANEL
CO2: 20
Calcium: 8.5
Creatinine, Ser: 0.82
GFR calc Af Amer: >60
Glucose, Bld: 173 — ABNORMAL HIGH

## 2010-11-20 LAB — CARDIAC PANEL(CRET KIN+CKTOT+MB+TROPI)
CK, MB: 4.1 — ABNORMAL HIGH
CK, MB: 5.6 — ABNORMAL HIGH
Relative Index: 3.8 — ABNORMAL HIGH
Relative Index: 4.7 — ABNORMAL HIGH
Total CK: 109
Total CK: 118
Troponin I: 0.05
Troponin I: 0.13 — ABNORMAL HIGH

## 2010-11-20 LAB — CK TOTAL AND CKMB (NOT AT ARMC)
CK, MB: 2.8
Relative Index: INVALID
Total CK: 59

## 2010-11-20 LAB — CBC
HCT: 48.8 — ABNORMAL HIGH
Hemoglobin: 16.1 — ABNORMAL HIGH
MCHC: 33
MCV: 89.2
Platelets: 270
RBC: 5.47 — ABNORMAL HIGH
RDW: 14
WBC: 10.1

## 2010-11-20 LAB — DIFFERENTIAL
Basophils Absolute: 0
Basophils Relative: 0
Eosinophils Absolute: 0.1
Eosinophils Relative: 1
Lymphocytes Relative: 22
Lymphs Abs: 2.2
Monocytes Absolute: 0.2
Monocytes Relative: 2 — ABNORMAL LOW
Neutro Abs: 7.6
Neutrophils Relative %: 75

## 2010-11-20 LAB — BASIC METABOLIC PANEL WITH GFR
BUN: 22
Chloride: 109
GFR calc non Af Amer: >60
Potassium: 3.5
Sodium: 141

## 2010-11-20 LAB — POCT CARDIAC MARKERS
CKMB, poc: 1.2
Myoglobin, poc: 77
Operator id: 272551
Troponin i, poc: 0.1 — ABNORMAL HIGH

## 2011-02-04 DIAGNOSIS — I4821 Permanent atrial fibrillation: Secondary | ICD-10-CM

## 2011-02-04 HISTORY — DX: Permanent atrial fibrillation: I48.21

## 2011-02-06 DIAGNOSIS — I252 Old myocardial infarction: Secondary | ICD-10-CM | POA: Diagnosis not present

## 2011-02-06 DIAGNOSIS — I2510 Atherosclerotic heart disease of native coronary artery without angina pectoris: Secondary | ICD-10-CM | POA: Diagnosis not present

## 2011-02-06 DIAGNOSIS — R109 Unspecified abdominal pain: Secondary | ICD-10-CM | POA: Diagnosis not present

## 2011-02-06 DIAGNOSIS — L989 Disorder of the skin and subcutaneous tissue, unspecified: Secondary | ICD-10-CM | POA: Diagnosis not present

## 2011-02-06 DIAGNOSIS — I10 Essential (primary) hypertension: Secondary | ICD-10-CM | POA: Diagnosis not present

## 2011-02-13 ENCOUNTER — Ambulatory Visit
Admission: RE | Admit: 2011-02-13 | Discharge: 2011-02-13 | Disposition: A | Discharge status: Home / Self Care | Payer: Medicare Other | Source: Ambulatory Visit | Attending: Oncology | Admitting: Oncology

## 2011-02-13 DIAGNOSIS — Z1231 Encounter for screening mammogram for malignant neoplasm of breast: Secondary | ICD-10-CM | POA: Diagnosis not present

## 2011-02-20 ENCOUNTER — Telehealth: Payer: Self-pay | Admitting: Oncology

## 2011-02-20 NOTE — Telephone Encounter (Signed)
S/w the pt's husband and he is aware of the march 2013 appts °

## 2011-03-03 DIAGNOSIS — I2510 Atherosclerotic heart disease of native coronary artery without angina pectoris: Secondary | ICD-10-CM | POA: Diagnosis not present

## 2011-04-02 DIAGNOSIS — R059 Cough, unspecified: Secondary | ICD-10-CM | POA: Diagnosis not present

## 2011-04-02 DIAGNOSIS — R05 Cough: Secondary | ICD-10-CM | POA: Diagnosis not present

## 2011-04-11 ENCOUNTER — Other Ambulatory Visit: Payer: Medicare Other | Admitting: Lab

## 2011-04-11 ENCOUNTER — Ambulatory Visit: Payer: Medicare Other | Admitting: Oncology

## 2011-04-23 ENCOUNTER — Ambulatory Visit (HOSPITAL_BASED_OUTPATIENT_CLINIC_OR_DEPARTMENT_OTHER): Payer: Medicare Other | Admitting: Oncology

## 2011-04-23 ENCOUNTER — Other Ambulatory Visit (HOSPITAL_BASED_OUTPATIENT_CLINIC_OR_DEPARTMENT_OTHER): Payer: Medicare Other | Admitting: Lab

## 2011-04-23 ENCOUNTER — Encounter: Payer: Self-pay | Admitting: Oncology

## 2011-04-23 ENCOUNTER — Telehealth: Payer: Self-pay | Admitting: Oncology

## 2011-04-23 VITALS — BP 126/54 | HR 52 | Temp 98.0°F | Wt 172.4 lb

## 2011-04-23 DIAGNOSIS — C569 Malignant neoplasm of unspecified ovary: Secondary | ICD-10-CM

## 2011-04-23 DIAGNOSIS — D3910 Neoplasm of uncertain behavior of unspecified ovary: Secondary | ICD-10-CM

## 2011-04-23 LAB — CBC WITH DIFFERENTIAL/PLATELET
BASO%: 0.5 % (ref 0.0–2.0)
Eosinophils Absolute: 0.3 10*3/uL (ref 0.0–0.5)
MCHC: 33.9 g/dL (ref 31.5–36.0)
MONO#: 0.5 10*3/uL (ref 0.1–0.9)
MONO%: 8.3 % (ref 0.0–14.0)
NEUT#: 4.1 10*3/uL (ref 1.5–6.5)
RBC: 4.14 10*6/uL (ref 3.70–5.45)
RDW: 14.1 % (ref 11.2–14.5)
WBC: 6.2 10*3/uL (ref 3.9–10.3)

## 2011-04-23 LAB — COMPREHENSIVE METABOLIC PANEL
ALT: 25 U/L (ref 0–35)
Albumin: 3.7 g/dL (ref 3.5–5.2)
Alkaline Phosphatase: 93 U/L (ref 39–117)
CO2: 29 mEq/L (ref 19–32)
Glucose, Bld: 121 mg/dL — ABNORMAL HIGH (ref 70–99)
Potassium: 3.8 mEq/L (ref 3.5–5.3)
Sodium: 143 mEq/L (ref 135–145)
Total Protein: 6 g/dL (ref 6.0–8.3)

## 2011-04-23 NOTE — Patient Instructions (Signed)
Office will set up CT whenever this suits in mid - late April and Dr.Nika Yazzie will see you again after the scan

## 2011-04-23 NOTE — Telephone Encounter (Signed)
Gv pt appt [id number].  scheduled ct scan on 04/18 @ [facility]

## 2011-04-23 NOTE — Progress Notes (Signed)
OFFICE PROGRESS NOTE Date of Visit 04-23-2011 Physicians: J.Varanasi, J.Osborne, B.Hoxworth  INTERVAL HISTORY:  Patient is seen, alone for visit, in continuing attention to her history of granulosa cell tumor of ovary, which was initially diagnosed in 2000 with recurrences in 2004, 2007 and spring 2011. Most recent intervention was resection of pelvic nodules by Dr.Hoxworth in May 2011. All of her disease has been very indolent and has generally been detected by CT scans. Last CT AP was May 2012; [name], self-limited episode of bowel obstruction in May 2012.  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 She notices some vague low abdominal discomfort intermittently when walking or after she stands for 6 hrs with teaching. Bowels have been moving well, no bladder symptoms, good appetite, good energy, stable shortness of breath when walks uphill, no chest pain, no other pain.She had URI/ environmental allergy symptoms for several weeks, still some nasal congestion/ post nasal drainage.  Remainder of full 10 point Review of Systems negative.  Cardiology care has changed to Dr.Varanasi since Dr.Tennant's retirement. She saw Dr.Osborne recently (with URI symptoms) and has been back to Dr.Hoxworth for check of the ventral hernia repair. She is willing to have repeat CT AP, which we will schedule for April.  Objective:  Vital signs in last 24 hours:  BP 126/54  Pulse 52  Temp(Src) 98 F (36.7 C) (Oral)  Wt 172 lb 6.4 oz (78.2 kg) Easily mobile, looks comfortable, just delightful as always.This weight is down 1 lb.   HEENT:mucous membranes moist, pharynx normal without lesions. Nasal turbinates slightly boggy. PERRL. 1 cm nontender subcutanous smooth nodule vertex of scalp and similar nontender smooth 1x 0.5 cm nodule right  base of skull.  LymphaticsCervical, supraclavicular, and axillary nodes normal.No inguinal adenopathy Resp: clear to auscultation bilaterally and normal percussion bilaterally Cardio: regular rate and rhythm GI: soft, nontender including lower abdomen bilateraly and suprapubic areas,  obese but not clearly distended. No HSM or clear masses Extremities: extremities normal, atraumatic, no cyanosis or edema Neuro:no sensory deficits noted    Lab Results:   Basename 04/23/11 1001  WBC 6.2  HGB 12.8  HCT 37.6  PLT 217  ANC 4.1  BMET CMET resulted after visit normal with exception of nonfasting glucose of 121 Studies/Results:  [facility]14[facility] without findings of concern. Medications: I have reviewed the patient's current medications.  Assessment/Plan: 1. Granulosa cell tumor of ovary: course to date as above. Will repeat CT AP after Easter and I will see her again after that scan. 2. 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

## 2011-05-22 ENCOUNTER — Telehealth: Payer: Self-pay | Admitting: *Deleted

## 2011-05-22 ENCOUNTER — Ambulatory Visit (HOSPITAL_COMMUNITY)
Admission: RE | Admit: 2011-05-22 | Discharge: 2011-05-22 | Disposition: A | Discharge status: Home / Self Care | Payer: Medicare Other | Source: Ambulatory Visit | Attending: Oncology | Admitting: Oncology

## 2011-05-22 DIAGNOSIS — N269 Renal sclerosis, unspecified: Secondary | ICD-10-CM | POA: Insufficient documentation

## 2011-05-22 DIAGNOSIS — I10 Essential (primary) hypertension: Secondary | ICD-10-CM | POA: Diagnosis not present

## 2011-05-22 DIAGNOSIS — Z0000 Encounter for general adult medical examination without abnormal findings: Secondary | ICD-10-CM | POA: Diagnosis not present

## 2011-05-22 DIAGNOSIS — K449 Diaphragmatic hernia without obstruction or gangrene: Secondary | ICD-10-CM | POA: Diagnosis not present

## 2011-05-22 DIAGNOSIS — R109 Unspecified abdominal pain: Secondary | ICD-10-CM | POA: Diagnosis not present

## 2011-05-22 DIAGNOSIS — C569 Malignant neoplasm of unspecified ovary: Secondary | ICD-10-CM | POA: Diagnosis not present

## 2011-05-22 DIAGNOSIS — I252 Old myocardial infarction: Secondary | ICD-10-CM | POA: Diagnosis not present

## 2011-05-22 DIAGNOSIS — R1084 Generalized abdominal pain: Secondary | ICD-10-CM | POA: Diagnosis not present

## 2011-05-22 DIAGNOSIS — Z1331 Encounter for screening for depression: Secondary | ICD-10-CM | POA: Diagnosis not present

## 2011-05-22 DIAGNOSIS — I2510 Atherosclerotic heart disease of native coronary artery without angina pectoris: Secondary | ICD-10-CM | POA: Diagnosis not present

## 2011-05-22 DIAGNOSIS — K439 Ventral hernia without obstruction or gangrene: Secondary | ICD-10-CM | POA: Diagnosis not present

## 2011-05-22 MED ORDER — IOHEXOL 300 MG/ML  SOLN
100.0000 mL | Freq: Once | INTRAMUSCULAR | Status: AC | PRN
  Administered 2011-05-22: 100 mL via INTRAVENOUS

## 2011-05-22 NOTE — Telephone Encounter (Signed)
RN received call from [facility] in radiology stating that patient is here for CT scan.  According to the schedule the appointment had been changed to 05/28/2011 but there is no note anywhere in the chart as to why the schedule has changed.  Linda from managed care was able to find out that she does not need pre-cert prior to the CT scan.  RN notified Nedra Hai in radiology and the patient is able to get the scan today instead of waiting.

## 2011-05-23 ENCOUNTER — Telehealth: Payer: Self-pay

## 2011-05-23 NOTE — Telephone Encounter (Signed)
Called pt and left message for her to call office back.  Need to inform her per Dr. Darrold Span, CT A/P done 4/18 looks fine- nothing that looks like any progression of the granulosa cell tumor, and if she wants, appt with her can be moved out even to Sept, but may keep app 06/06/11 if she wants.

## 2011-05-26 ENCOUNTER — Telehealth: Payer: Self-pay

## 2011-05-26 ENCOUNTER — Other Ambulatory Visit: Payer: Self-pay | Admitting: Oncology

## 2011-05-26 DIAGNOSIS — D3910 Neoplasm of uncertain behavior of unspecified ovary: Secondary | ICD-10-CM

## 2011-05-26 NOTE — Telephone Encounter (Signed)
Received message from pt that she was returning a call from this office, and can be reached at [phone].  Called pt back and informed her per Dr. Darrold Span, CT of abdomen/pelvis done 4/18 looks fine- nothing that looks like any progression of her granulosa cell tumor.  Informed her per Dr. Darrold Span, she can move appt out to September, or keep appt 06/06/11.  Pt prefers to move appt out to September, and states she will call our office if she has any problems before then.  Informed pt schedulers will contact her with new appt.  Pt verbalizes understanding. Onc tx schedule to be sent to schedulers.

## 2011-05-27 ENCOUNTER — Telehealth: Payer: Self-pay | Admitting: Oncology

## 2011-05-27 NOTE — Telephone Encounter (Signed)
Called pt, left message with husband ,appt on 10/17/11 lab and MD visit.

## 2011-05-28 ENCOUNTER — Inpatient Hospital Stay (HOSPITAL_COMMUNITY)
Admission: RE | Admit: 2011-05-28 | Discharge: 2011-05-28 | Payer: Medicare Other | Source: Ambulatory Visit | Attending: Oncology | Admitting: Oncology

## 2011-06-06 ENCOUNTER — Ambulatory Visit: Payer: Medicare Other | Admitting: Oncology

## 2011-07-25 DIAGNOSIS — H2510 Age-related nuclear cataract, unspecified eye: Secondary | ICD-10-CM | POA: Diagnosis not present

## 2011-07-25 DIAGNOSIS — H35369 Drusen (degenerative) of macula, unspecified eye: Secondary | ICD-10-CM | POA: Diagnosis not present

## 2011-07-25 DIAGNOSIS — H524 Presbyopia: Secondary | ICD-10-CM | POA: Diagnosis not present

## 2011-08-05 DIAGNOSIS — B354 Tinea corporis: Secondary | ICD-10-CM | POA: Diagnosis not present

## 2011-08-28 DIAGNOSIS — H35369 Drusen (degenerative) of macula, unspecified eye: Secondary | ICD-10-CM | POA: Diagnosis not present

## 2011-09-09 DIAGNOSIS — K469 Unspecified abdominal hernia without obstruction or gangrene: Secondary | ICD-10-CM | POA: Diagnosis not present

## 2011-10-09 DIAGNOSIS — M79609 Pain in unspecified limb: Secondary | ICD-10-CM | POA: Diagnosis not present

## 2011-10-16 ENCOUNTER — Encounter (INDEPENDENT_AMBULATORY_CARE_PROVIDER_SITE_OTHER): Payer: Self-pay | Admitting: General Surgery

## 2011-10-16 ENCOUNTER — Ambulatory Visit (INDEPENDENT_AMBULATORY_CARE_PROVIDER_SITE_OTHER): Payer: Medicare Other | Admitting: General Surgery

## 2011-10-16 VITALS — BP 108/68 | HR 71 | Temp 97.4°F | Resp 18 | Ht 60.0 in | Wt 175.8 lb

## 2011-10-16 DIAGNOSIS — K439 Ventral hernia without obstruction or gangrene: Secondary | ICD-10-CM | POA: Diagnosis not present

## 2011-10-16 NOTE — Progress Notes (Signed)
Chief complaint: Pain and lower abdominal hernia site  History: Patient returns to the office for followup. She is well known from multiple [place] procedures. She has a history of granulosis cell tumor of the ovary with several recurrences. She is status post TAH-BSO in 2000. She underwent resection of recurrence with small bowel resection as well as colectomy for diverticulosis in 2004. She had laparoscopic repair of a ventral hernia in 2005. In 2007 she had re\re resection of pelvic recurrence of her tumor and open repair of ventral hernia with Kugel mesh. In 2011 she had laparoscopic resection of a suprapubic recurrence of tumor. A small recurrent suprapubic hernia was noted at laparoscopy and on CT at that time but was asymptomatic and we elected to observe.  Over the last several months she has had some intermittent pain just to the right upper suprapubic area related to exercise. There is a small lump there. She has not had any GI symptoms. The pain is somewhat significant at times but she has been able to continue with all her activities. She has followup arranged in the near future Dr. Darrold Span. CT scan the spring showed no is recurrent tumor and again noted a suprapubic recurrence of her incisional hernia. She has no associated nausea or vomiting or distention.  Past Medical History  Diagnosis Date  . IHD (ischemic heart disease)     Remote MI in 2000 with stent to LAD and LCX. Failed  intervention to OM in 2009. Managed medically since that time.  . MI, old 17  . Hypertension   . Hyperlipidemia   . Tobacco abuse   . Obesity   . Granulosa cell tumor     Has had several surgeries due to her tumor  . Diverticulitis   . Stroke     Old CVA noted on MRI in June 2011  . Cancer    Past Surgical History  Procedure Date  . Cardiac catheterization 09/03/2007    EF 70%; Failed attempt at PCI to OM  . Cardiac catheterization 11/01/2003    EF 70%  . Cholecystectomy   . Parotid mass removed    . US echocardiography 11/21/2003    EF 55-60%  . Cardiovascular stress test 03/23/2009    EF 72%  . Coronary stent placement 2000    LAD and LCX  . Hernia repair laparoscopic 2005 and open 2007  . Abdominal hysterectomy   . Small intestine surgery   . Colon surgery 2004    colectomy for diverticulosis  . Granulosa cell tumor excision 2004, 2007, 2011    Current Outpatient Prescriptions  Medication Sig Dispense Refill  . amLODipine-atorvastatin (CADUET) 10-80 MG per tablet Take 1 tablet by mouth daily.        Marland Kitchen aspirin 325 MG tablet Take 325 mg by mouth daily.        . Calcium Carbonate (CALTRATE 600 PO) Take by mouth 2 (two) times daily. Once a day 500      . clopidogrel (PLAVIX) 75 MG tablet Take 75 mg by mouth daily.        . clotrimazole (LOTRIMIN) 1 % cream       . Coenzyme Q10 (COQ10 PO) Take 120 mg by mouth daily.        . fish oil-omega-3 fatty acids 1000 MG capsule Take 2 g by mouth every other day.        . isosorbide mononitrate (IMDUR) 60 MG 24 hr tablet Take 60 mg by mouth daily.        Marland Kitchen  lisinopril (PRINIVIL,ZESTRIL) 40 MG tablet Take 40 mg by mouth daily.        . metoprolol (LOPRESSOR) 50 MG tablet Take 50 mg by mouth 2 (two) times daily.        . Multiple Vitamin (MULTIVITAMIN) tablet Take 1 tablet by mouth daily.        . nitroGLYCERIN (NITROSTAT) 0.4 MG SL tablet Place 0.4 mg under the tongue every 5 (five) minutes as needed.        . TOPROL XL 50 MG 24 hr tablet       . vitamin C (ASCORBIC ACID) 500 MG tablet Take 500 mg by mouth as needed.       . vitamin E (VITAMIN E) 400 UNIT capsule Take 400 Units by mouth daily.        . hydrochlorothiazide 25 MG tablet Take 25 mg by mouth daily.        Marland Kitchenall History  Substance Use Topics  . Smoking status: Former Smoker    Quit date: 02/03/1974  . Smokeless tobacco: Not on file  . Alcohol Use: No   Exam: Gen.: Moderately obese but appears well Skin: No rash or infection HEENT: No palpable masses. Sclera  nonicteric. Lymph nodes: No cervical, supraclavicular, inguinal nodes palpable Lungs: Clear equal breath sounds bilaterally Cardiovascular: Regular rate and rhythm. Trace ankle edema. Abdomen: Long midline incision. There is a tender mass just to the right of her suprapubic midline that is reducible with her line. This is consistent with a hernia. Otherwise negative. Extremities: No joint swelling or deformity Neurologic: Alert and oriented gait normal  Assessment and plan: Known multiply recurrent suprapubic ventral hernia with history of recurrent granulosa cell tumor. No evidence of tumor on her last CT scan. Her hernia is becoming symptomatic and I believe will need to be repaired. She has a followup with oncology in the next month and likely a CT will be repeated. I would certainly like to confirm that she will not need reresection of her granulosa cell tumor before proceeding with hernia repair. She will call if she gets any worse. I will see her back in 3 months. If there is no evidence of tumor on followup I think we can consider repairing her hernia at that time.

## 2011-10-16 NOTE — Patient Instructions (Signed)
Call as needed for any worsening symptoms and I will see you in 3 months

## 2011-10-17 ENCOUNTER — Other Ambulatory Visit: Payer: Medicare Other | Admitting: Lab

## 2011-10-17 ENCOUNTER — Ambulatory Visit: Payer: Medicare Other | Admitting: Oncology

## 2011-10-20 ENCOUNTER — Telehealth: Payer: Self-pay

## 2011-10-20 NOTE — Telephone Encounter (Signed)
Patty [name] actually called and spoke with a scheduler on 10-16-11 to Cancell appt. on 10-17-11.  Apologized for the missed communication.   Rescheduled pt. For 11-14-11 at 1200.

## 2011-11-14 ENCOUNTER — Ambulatory Visit (HOSPITAL_BASED_OUTPATIENT_CLINIC_OR_DEPARTMENT_OTHER): Payer: Medicare Other | Admitting: Oncology

## 2011-11-14 ENCOUNTER — Encounter: Payer: Self-pay | Admitting: Oncology

## 2011-11-14 ENCOUNTER — Telehealth: Payer: Self-pay | Admitting: Oncology

## 2011-11-14 ENCOUNTER — Other Ambulatory Visit: Payer: Medicare Other | Admitting: Lab

## 2011-11-14 VITALS — BP 138/69 | HR 57 | Temp 97.2°F | Resp 20 | Ht 60.0 in | Wt 182.3 lb

## 2011-11-14 DIAGNOSIS — K439 Ventral hernia without obstruction or gangrene: Secondary | ICD-10-CM

## 2011-11-14 DIAGNOSIS — D3910 Neoplasm of uncertain behavior of unspecified ovary: Secondary | ICD-10-CM | POA: Diagnosis not present

## 2011-11-14 DIAGNOSIS — C569 Malignant neoplasm of unspecified ovary: Secondary | ICD-10-CM | POA: Diagnosis not present

## 2011-11-14 LAB — COMPREHENSIVE METABOLIC PANEL
AST: 35 U/L (ref 0–37)
Albumin: 3.5 g/dL (ref 3.5–5.2)
Alkaline Phosphatase: 83 U/L (ref 39–117)
BUN: 23 mg/dL (ref 6–23)
Creatinine, Ser: 0.73 mg/dL (ref 0.50–1.10)
Potassium: 3.7 mEq/L (ref 3.5–5.3)
Total Bilirubin: 0.7 mg/dL (ref 0.3–1.2)

## 2011-11-14 NOTE — Progress Notes (Signed)
OFFICE PROGRESS NOTE   11/14/2011   Physicians:J.Varanasi, J.Osborne, B.Hoxworth   INTERVAL HISTORY:  Patient is seen, alone for visit, in continuing follow up of her history of granulosa cell tumor of ovary  which was initially diagnosed in 2000 with recurrences in 2004, 2007 and spring 2011. Surgery in 2004 included resection of the tumor with small bowel resection and colectomy for diverticulosis. She had laparoscopic repair of ventral hernia in 2005. In 2007 she had resection of pelvic recurrence of tumor plus open repair of ventral hernia with mesh. Most recent intervention was laparoscopic resection of suprapubic involvement by Dr.Hoxworth in May 2011; she had a small and asymptomatic suprapubic hernia in 2011 which has been followed. All of her disease has been very indolent and has generally been detected by CT scans. Last CT AP was April 2013; [name], self-limited episode of bowel obstruction in May 2012. She saw Dr Johna Sheriff [phone], more symptomatic from the suprapubic hernia and likely to go to repair at least in next couple of months, after repeat CT to know status of the granulosa cell tumor.    Patient has had more fairly mild discomfort at the hernia, which is continuous but not worse than when she saw Dr Johna Sheriff. She has had upper respiratory congestion and drainage which seems to be environmental allergies. She has no new pain. Energy is good, now doing water aerobics twice weekly. She has had no change in bowels, no lower respiratory symptoms, no fever, no LE swelling. Remainder of 10 point Review of Systems negative.  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  Objective:  Vital signs in last 24 hours:  BP 138/69  Pulse 57  Temp 97.2 F (36.2 C) (Oral)  Resp 20  Ht 5' (1.524 m)  Wt 182 lb 4.8 oz (82.691 kg)  BMI 35.60 kg/m2 Weight is up 10 lbs.  Easily mobile, sounds nasally congested, NAD    HEENT:PERRLA, sclera clear, anicteric and oropharynx clear, no lesions Nasal turbinates without purulent drainage LymphaticsCervical, supraclavicular, and axillary nodes normal.No inguinal adenopathy. Resp: clear to auscultation bilaterally and normal percussion bilaterally Cardio: regular rate and rhythm GI: soft, not distended, surgical incisions as previously Slightly tender at soft defect at lower right side of  midline incision Extremities: extremities normal, atraumatic, no cyanosis or edema Neuro:no sensory deficits noted Breast:normal without suspicious masses, skin or nipple changes or axillary nodes Skin without rash or ecchymosis  Lab Results:  Results for orders placed in visit on 04/23/11  CBC WITH DIFFERENTIAL      Component Value Range   WBC 6.2  3.9 - 10.3 10e3/uL   NEUT# 4.1  1.5 - 6.5 10e3/uL   HGB 12.8  11.6 - 15.9 g/dL   HCT 82.9  56.2 - 13.0 %   Platelets 217  145 - 400 10e3/uL   MCV 90.9  79.5 - 101.0 fL   MCH 30.8  25.1 - 34.0 pg   MCHC 33.9  31.5 - 36.0 g/dL   RBC 8.65  7.84 - 6.96 10e6/uL   RDW 14.1  11.2 - 14.5 %   lymph# 1.4  0.9 - 3.3 10e3/uL   MONO# 0.5  0.1 - 0.9 10e3/uL   Eosinophils Absolute 0.3  0.0 - 0.5 10e3/uL   Basophils Absolute 0.0  0.0 - 0.1 10e3/uL   NEUT% 65.3  38.4 - 76.8 %  LYMPH% 21.8  14.0 - 49.7 %   MONO% 8.3  0.0 - 14.0 %   EOS% 4.1  0.0 - 7.0 %   BASO% 0.5  0.0 - 2.0 %  COMPREHENSIVE METABOLIC PANEL      Component Value Range   Sodium 143  135 - 145 mEq/L   Potassium 3.8  3.5 - 5.3 mEq/L   Chloride 104  96 - 112 mEq/L   CO2 29  19 - 32 mEq/L   Glucose, Bld 121 (*) 70 - 99 mg/dL   BUN 20  6 - 23 mg/dL   Creatinine, Ser 1.61  0.50 - 1.10 mg/dL   Total Bilirubin 0.8  0.3 - 1.2 mg/dL   Alkaline Phosphatase 93  39 - 117 U/L   AST 28  0 - 37 U/L   ALT 25  0 - 35 U/L   Total Protein 6.0  6.0 - 8.3 g/dL   Albumin 3.7  3.5 - 5.2 g/dL   Calcium 8.8  8.4 - 09.6 mg/dL      Studies/Results:  We will set up CT AP in next few weeks  Medications: I have reviewed the patient's current medications.  Assessment/Plan: 1. Granulosa cell tumor of ovary: indolent recurrences over last ~ 10 years. CT as above now, particularly to evaluate this prior to repair of ventral hernia. I will talk with her by phone after scan if that is appropriate, but have also set up return visit after scan which we can cancel if not needed. 3.More symptomatic ventral hernia, without acute problems. Dr Johna Sheriff aware. 4.she will get flu vaccine elsewhere this fall  Patient was in agreement with plan above.   LIVESAY,LENNIS P, MD   11/14/2011, 1:51 PM

## 2011-11-14 NOTE — Patient Instructions (Signed)
We can probably speak by phone about the CT scan, tho I will set up a follow up visit in case that is needed to show pictures and discuss

## 2011-11-14 NOTE — Telephone Encounter (Signed)
Appts made and printed for pt  aom 

## 2011-11-20 DIAGNOSIS — I10 Essential (primary) hypertension: Secondary | ICD-10-CM | POA: Diagnosis not present

## 2011-11-28 ENCOUNTER — Telehealth: Payer: Self-pay | Admitting: Oncology

## 2011-11-28 ENCOUNTER — Other Ambulatory Visit: Payer: Self-pay | Admitting: Oncology

## 2011-11-28 ENCOUNTER — Other Ambulatory Visit: Payer: Medicare Other | Admitting: Lab

## 2011-11-28 ENCOUNTER — Ambulatory Visit (HOSPITAL_COMMUNITY)
Admission: RE | Admit: 2011-11-28 | Discharge: 2011-11-28 | Disposition: A | Discharge status: Home / Self Care | Payer: Medicare Other | Source: Ambulatory Visit | Attending: Oncology | Admitting: Oncology

## 2011-11-28 ENCOUNTER — Telehealth: Payer: Self-pay | Admitting: *Deleted

## 2011-11-28 DIAGNOSIS — Z1289 Encounter for screening for malignant neoplasm of other sites: Secondary | ICD-10-CM | POA: Insufficient documentation

## 2011-11-28 DIAGNOSIS — K439 Ventral hernia without obstruction or gangrene: Secondary | ICD-10-CM | POA: Insufficient documentation

## 2011-11-28 DIAGNOSIS — K7689 Other specified diseases of liver: Secondary | ICD-10-CM | POA: Diagnosis not present

## 2011-11-28 DIAGNOSIS — D3910 Neoplasm of uncertain behavior of unspecified ovary: Secondary | ICD-10-CM | POA: Diagnosis not present

## 2011-11-28 MED ORDER — IOHEXOL 300 MG/ML  SOLN
100.0000 mL | Freq: Once | INTRAMUSCULAR | Status: AC | PRN
  Administered 2011-11-28: 100 mL via INTRAVENOUS

## 2011-11-28 NOTE — Telephone Encounter (Signed)
Notified patient she does not need to come for labs today. CT confirmed for today at 4:30

## 2011-11-28 NOTE — Telephone Encounter (Signed)
aware that no lab is needed for today    aom

## 2011-12-01 ENCOUNTER — Telehealth: Payer: Self-pay | Admitting: *Deleted

## 2011-12-01 NOTE — Telephone Encounter (Signed)
Pt was not at home. Husband, Elijah Birk notified of CT results. "Does not show anything that we can tell is cancer, she has the hernia in suprapubic area with small loop of small bowel- also some diverticular disease but no diverticulitis, arthritis as before thru the lumbar spine and som atherosclerosis as before. Seems good from cancer standpoint"

## 2011-12-03 ENCOUNTER — Telehealth: Payer: Self-pay

## 2011-12-03 DIAGNOSIS — J069 Acute upper respiratory infection, unspecified: Secondary | ICD-10-CM | POA: Diagnosis not present

## 2011-12-03 DIAGNOSIS — D3910 Neoplasm of uncertain behavior of unspecified ovary: Secondary | ICD-10-CM

## 2011-12-03 NOTE — Telephone Encounter (Signed)
Message copied by Lorine Bears on Wed Dec 03, 2011  4:02 PM ------      Message from: Jama Flavors P      Created: Wed Dec 03, 2011  3:43 PM       Since CT looked good, without recurrent cancer that we can tell, fine to cancel appointment with me Nov 6 if she prefers.  If cancelled, please reschedule in 6 months with CBC/CMET.  Please ask if patient has other questions, as I believe Tammi left message re CT with husband.

## 2011-12-03 NOTE — Telephone Encounter (Signed)
Discussed information aas noted from Dr. Darrold Span.  Will cancell appt. for 12-10-11 and send orders to schedulers as noted below to reschedule appt. Pt. Very pleased.

## 2011-12-04 ENCOUNTER — Telehealth: Payer: Self-pay | Admitting: *Deleted

## 2011-12-04 NOTE — Telephone Encounter (Signed)
Patient confirmed over the phone the new date and time on 05-26-2012 starting at 11:00am

## 2011-12-10 ENCOUNTER — Ambulatory Visit: Payer: Medicare Other | Admitting: Oncology

## 2011-12-23 DIAGNOSIS — I4891 Unspecified atrial fibrillation: Secondary | ICD-10-CM | POA: Diagnosis not present

## 2011-12-23 DIAGNOSIS — I2510 Atherosclerotic heart disease of native coronary artery without angina pectoris: Secondary | ICD-10-CM | POA: Diagnosis not present

## 2011-12-26 DIAGNOSIS — Z7901 Long term (current) use of anticoagulants: Secondary | ICD-10-CM | POA: Diagnosis not present

## 2011-12-26 DIAGNOSIS — I4891 Unspecified atrial fibrillation: Secondary | ICD-10-CM | POA: Diagnosis not present

## 2011-12-28 ENCOUNTER — Emergency Department (HOSPITAL_COMMUNITY): Payer: Medicare Other

## 2011-12-28 ENCOUNTER — Inpatient Hospital Stay (HOSPITAL_COMMUNITY)
Admission: EM | Admit: 2011-12-28 | Discharge: 2012-01-02 | DRG: 308 | Disposition: A | Discharge status: Home Health | Payer: Medicare Other | Attending: Interventional Cardiology | Admitting: Interventional Cardiology

## 2011-12-28 ENCOUNTER — Encounter (HOSPITAL_COMMUNITY): Payer: Self-pay | Admitting: *Deleted

## 2011-12-28 DIAGNOSIS — I2510 Atherosclerotic heart disease of native coronary artery without angina pectoris: Secondary | ICD-10-CM | POA: Diagnosis not present

## 2011-12-28 DIAGNOSIS — I5031 Acute diastolic (congestive) heart failure: Secondary | ICD-10-CM

## 2011-12-28 DIAGNOSIS — E669 Obesity, unspecified: Secondary | ICD-10-CM | POA: Diagnosis present

## 2011-12-28 DIAGNOSIS — I4891 Unspecified atrial fibrillation: Secondary | ICD-10-CM

## 2011-12-28 DIAGNOSIS — R0902 Hypoxemia: Secondary | ICD-10-CM

## 2011-12-28 DIAGNOSIS — Z87891 Personal history of nicotine dependence: Secondary | ICD-10-CM

## 2011-12-28 DIAGNOSIS — I712 Thoracic aortic aneurysm, without rupture: Secondary | ICD-10-CM | POA: Diagnosis not present

## 2011-12-28 DIAGNOSIS — E876 Hypokalemia: Secondary | ICD-10-CM | POA: Diagnosis present

## 2011-12-28 DIAGNOSIS — Z8673 Personal history of transient ischemic attack (TIA), and cerebral infarction without residual deficits: Secondary | ICD-10-CM

## 2011-12-28 DIAGNOSIS — I509 Heart failure, unspecified: Secondary | ICD-10-CM

## 2011-12-28 DIAGNOSIS — I5033 Acute on chronic diastolic (congestive) heart failure: Secondary | ICD-10-CM | POA: Diagnosis present

## 2011-12-28 DIAGNOSIS — Z7901 Long term (current) use of anticoagulants: Secondary | ICD-10-CM

## 2011-12-28 DIAGNOSIS — I10 Essential (primary) hypertension: Secondary | ICD-10-CM | POA: Diagnosis present

## 2011-12-28 DIAGNOSIS — E785 Hyperlipidemia, unspecified: Secondary | ICD-10-CM | POA: Diagnosis present

## 2011-12-28 HISTORY — DX: Peripheral vascular disease, unspecified: I73.9

## 2011-12-28 LAB — POCT I-STAT TROPONIN I: Troponin i, poc: 0 ng/mL (ref 0.00–0.08)

## 2011-12-28 LAB — COMPREHENSIVE METABOLIC PANEL
Alkaline Phosphatase: 173 U/L — ABNORMAL HIGH (ref 39–117)
Alkaline Phosphatase: 166 U/L — ABNORMAL HIGH (ref 39–117)
BUN: 18 mg/dL (ref 6–23)
BUN: 15 mg/dL (ref 6–23)
CO2: 29 mEq/L (ref 19–32)
Chloride: 101 mEq/L (ref 96–112)
Chloride: 100 mEq/L (ref 96–112)
Creatinine, Ser: 0.68 mg/dL (ref 0.50–1.10)
GFR calc Af Amer: >90 mL/min (ref >90)
GFR calc Af Amer: >90 mL/min (ref >90)
GFR calc non Af Amer: 81 mL/min — ABNORMAL LOW (ref >90)
GFR calc non Af Amer: 79 mL/min — ABNORMAL LOW (ref >90)
Glucose, Bld: 126 mg/dL — ABNORMAL HIGH (ref 70–99)
Glucose, Bld: 151 mg/dL — ABNORMAL HIGH (ref 70–99)
Potassium: 3.3 mEq/L — ABNORMAL LOW (ref 3.5–5.1)
Potassium: 3.1 mEq/L — ABNORMAL LOW (ref 3.5–5.1)
Total Bilirubin: 0.7 mg/dL (ref 0.3–1.2)
Total Bilirubin: 0.7 mg/dL (ref 0.3–1.2)

## 2011-12-28 LAB — PROTIME-INR
INR: 1.81 — ABNORMAL HIGH (ref 0.00–1.49)
Prothrombin Time: 20.3 seconds — ABNORMAL HIGH (ref 11.6–15.2)

## 2011-12-28 LAB — PRO B NATRIURETIC PEPTIDE: Pro B Natriuretic peptide (BNP): 1212 pg/mL — ABNORMAL HIGH (ref 0–450)

## 2011-12-28 LAB — CBC WITH DIFFERENTIAL/PLATELET
Basophils Absolute: 0 10*3/uL (ref 0.0–0.1)
Eosinophils Relative: 2 % (ref 0–5)
Lymphocytes Relative: 14 % (ref 12–46)
Lymphs Abs: 1.1 10*3/uL (ref 0.7–4.0)
Neutrophils Relative %: 78 % — ABNORMAL HIGH (ref 43–77)
Platelets: 233 10*3/uL (ref 150–400)
RBC: 4.49 MIL/uL (ref 3.87–5.11)
RDW: 13.5 % (ref 11.5–15.5)
WBC: 7.8 10*3/uL (ref 4.0–10.5)

## 2011-12-28 LAB — TSH: TSH: 3.05 u[IU]/mL (ref 0.350–4.500)

## 2011-12-28 MED ORDER — WARFARIN - PHARMACIST DOSING INPATIENT: Freq: Every day | Status: DC

## 2011-12-28 MED ORDER — ACETAMINOPHEN 500 MG PO TABS
500.0000 mg | ORAL_TABLET | Freq: Four times a day (QID) | ORAL | Status: DC | PRN
  Filled 2011-12-28: qty 1

## 2011-12-28 MED ORDER — FUROSEMIDE 10 MG/ML IJ SOLN
20.0000 mg | Freq: Once | INTRAMUSCULAR | Status: AC
  Administered 2011-12-28: 20 mg via INTRAVENOUS
  Filled 2011-12-28: qty 2

## 2011-12-28 MED ORDER — ATORVASTATIN CALCIUM 80 MG PO TABS
80.0000 mg | ORAL_TABLET | Freq: Every day | ORAL | Status: DC
  Administered 2011-12-28 – 2012-01-02 (×5): 80 mg via ORAL
  Filled 2011-12-28 (×6): qty 1

## 2011-12-28 MED ORDER — POTASSIUM CHLORIDE CRYS ER 20 MEQ PO TBCR
40.0000 meq | EXTENDED_RELEASE_TABLET | Freq: Once | ORAL | Status: AC
  Administered 2011-12-28: 40 meq via ORAL
  Filled 2011-12-28: qty 2

## 2011-12-28 MED ORDER — SODIUM CHLORIDE 0.9 % IV SOLN: 250.0000 mL | INTRAVENOUS | Status: DC | PRN

## 2011-12-28 MED ORDER — AMLODIPINE-ATORVASTATIN 10-80 MG PO TABS: 1.0000 | ORAL_TABLET | Freq: Every day | ORAL | Status: DC

## 2011-12-28 MED ORDER — ONDANSETRON HCL 4 MG/2ML IJ SOLN: 4.0000 mg | Freq: Four times a day (QID) | INTRAMUSCULAR | Status: DC | PRN

## 2011-12-28 MED ORDER — DILTIAZEM HCL 100 MG IV SOLR
5.0000 mg/h | INTRAVENOUS | Status: DC
  Administered 2011-12-28: 5 mg/h via INTRAVENOUS
  Administered 2011-12-28 – 2011-12-29 (×2): 10 mg/h via INTRAVENOUS
  Filled 2011-12-28 (×4): qty 100

## 2011-12-28 MED ORDER — ACETAMINOPHEN 325 MG PO TABS: 650.0000 mg | ORAL_TABLET | ORAL | Status: DC | PRN

## 2011-12-28 MED ORDER — METOPROLOL TARTRATE 50 MG PO TABS
50.0000 mg | ORAL_TABLET | Freq: Two times a day (BID) | ORAL | Status: DC
  Administered 2011-12-28 – 2011-12-31 (×6): 50 mg via ORAL
  Filled 2011-12-28 (×9): qty 1

## 2011-12-28 MED ORDER — SODIUM CHLORIDE 0.9 % IV BOLUS (SEPSIS)
1000.0000 mL | Freq: Once | INTRAVENOUS | Status: AC
Start: 2011-12-28 — End: 2011-12-28
  Administered 2011-12-28: 1000 mL via INTRAVENOUS

## 2011-12-28 MED ORDER — DILTIAZEM HCL 50 MG/10ML IV SOLN
10.0000 mg | Freq: Once | INTRAVENOUS | Status: AC
  Administered 2011-12-28: 10 mg via INTRAVENOUS
  Filled 2011-12-28: qty 2

## 2011-12-28 MED ORDER — IOHEXOL 350 MG/ML SOLN
100.0000 mL | Freq: Once | INTRAVENOUS | Status: AC | PRN
  Administered 2011-12-28: 100 mL via INTRAVENOUS

## 2011-12-28 MED ORDER — SODIUM CHLORIDE 0.9 % IJ SOLN
3.0000 mL | Freq: Two times a day (BID) | INTRAMUSCULAR | Status: DC
  Administered 2011-12-28 – 2012-01-01 (×8): 3 mL via INTRAVENOUS

## 2011-12-28 MED ORDER — ISOSORBIDE MONONITRATE ER 60 MG PO TB24
60.0000 mg | ORAL_TABLET | Freq: Every day | ORAL | Status: DC
  Administered 2011-12-28 – 2012-01-02 (×5): 60 mg via ORAL
  Filled 2011-12-28 (×6): qty 1

## 2011-12-28 MED ORDER — FUROSEMIDE 10 MG/ML IJ SOLN
40.0000 mg | Freq: Two times a day (BID) | INTRAMUSCULAR | Status: DC
  Administered 2011-12-28 – 2012-01-02 (×10): 40 mg via INTRAVENOUS
  Filled 2011-12-28 (×12): qty 4

## 2011-12-28 MED ORDER — ALBUTEROL SULFATE (5 MG/ML) 0.5% IN NEBU
INHALATION_SOLUTION | RESPIRATORY_TRACT | Status: AC
  Filled 2011-12-28: qty 0.5

## 2011-12-28 MED ORDER — LISINOPRIL 40 MG PO TABS
40.0000 mg | ORAL_TABLET | Freq: Every day | ORAL | Status: DC
  Administered 2011-12-28 – 2012-01-02 (×5): 40 mg via ORAL
  Filled 2011-12-28 (×6): qty 1

## 2011-12-28 MED ORDER — SODIUM CHLORIDE 0.9 % IJ SOLN
3.0000 mL | INTRAMUSCULAR | Status: DC | PRN
  Administered 2011-12-29: 3 mL via INTRAVENOUS

## 2011-12-28 MED ORDER — WARFARIN SODIUM 3 MG PO TABS
3.0000 mg | ORAL_TABLET | Freq: Once | ORAL | Status: AC
  Administered 2011-12-28: 3 mg via ORAL
  Filled 2011-12-28 (×2): qty 1

## 2011-12-28 MED ORDER — ASPIRIN EC 81 MG PO TBEC
81.0000 mg | DELAYED_RELEASE_TABLET | Freq: Every day | ORAL | Status: DC
  Administered 2011-12-28 – 2012-01-02 (×5): 81 mg via ORAL
  Filled 2011-12-28 (×6): qty 1

## 2011-12-28 MED ORDER — AMLODIPINE BESYLATE 5 MG PO TABS
5.0000 mg | ORAL_TABLET | Freq: Every day | ORAL | Status: DC
  Administered 2011-12-28 – 2012-01-02 (×5): 5 mg via ORAL
  Filled 2011-12-28 (×6): qty 1

## 2011-12-28 MED ORDER — NITROGLYCERIN 0.4 MG SL SUBL
0.4000 mg | SUBLINGUAL_TABLET | SUBLINGUAL | Status: DC | PRN
  Filled 2011-12-28: qty 25

## 2011-12-28 NOTE — ED Notes (Signed)
Assisted to [facility]- increased SOB with any exertion, assisted back into bed--

## 2011-12-28 NOTE — Progress Notes (Signed)
12/28/11 MD paged for critical lab value, Troponin 0.54. Patient on coumadin, no new orders received. Anastasia Fiedler RN

## 2011-12-28 NOTE — ED Notes (Signed)
Patient transported to CT 

## 2011-12-28 NOTE — Progress Notes (Signed)
CRITICAL VALUE ALERT  Critical value received:  Troponin 0.54  Date of notification:  12/28/11  Time of notification:  1700  Critical value read back: yes  Nurse who received alert:  Danielle Dess RN  MD notified (1st page):  Dr. Mayford Knife  Time of first page:[phone]    MD notified (2nd page):  Time of second page:  Responding MD:  Dr. Mayford Knife  Time MD responded:  [phone]

## 2011-12-28 NOTE — ED Notes (Signed)
Dr. Turner at bedside.

## 2011-12-28 NOTE — H&P (Signed)
Admit date: 12/28/2011 Referring Physician  Dr. Silverio Lay Primary Cardiologist Dr. Eldridge Dace Chief complaint/reason for admission: chest pain, SOB and atrial fibrillation  HPI: This is a very pleasant 78yo WF with a history of CAD/ normal LVF, HTN, hyperlipidemia, and recently diagnosed atrial fibrillation who presented to the ER with complaints of SOB.  Apparently she was seen by my NP last Tuesday with chest tightness with mild SOB intermittent for 10 day duration.  She was also complaining of faitugue, lightheadedness and back pain.  She was found to be in atrial fibrillation and was started on Coumadin and Plavix was stopped.  She continued to feel poorly and yesterday started to feel more SOB.  She did not sleep well all night due to SOB.  This am her breathing was very bad and she went to the ER.  The chest tightness has also persisted but currently she is pain free.  She has chronic left LE edema which has worsened over the past few days.      PMH:    Past Medical History  Diagnosis Date  . IHD (ischemic heart disease)     Remote MI in 2000 with stent to LAD and LCX. Failed  intervention to OM in 2009. Managed medically since that time.  . MI, old 52  . Hypertension   . Hyperlipidemia   . Tobacco abuse   . Obesity   . Granulosa cell tumor     Has had several surgeries due to her tumor  . Diverticulitis   . Stroke     Old CVA noted on MRI in June 2011   New onset atrial fibrillation started on Coumadin 12/23/2011   . Cancer       PSH:    Past Surgical History  Procedure Date  . Cardiac catheterization 09/03/2007    EF 70%; Failed attempt at PCI to OM  . Cardiac catheterization 11/01/2003    EF 70%  . Cholecystectomy   . Parotid mass removed   . US echocardiography 11/21/2003    EF 55-60%  . Cardiovascular stress test 03/23/2009    EF 72%  . Coronary stent placement 2000    LAD and LCX  . Hernia repair laparoscopic 2005 and open 2007  . Abdominal hysterectomy   . Small  intestine surgery   . Colon surgery 2004    colectomy for diverticulosis  . Granulosa cell tumor excision 2004, 2007, 2011     ALLERGIES:   Bee venom and Other  Prior to Admit Meds:   (Not in a hospital admission) Family HX:    Family History  Problem Relation Age of Onset  . Heart attack Father    Social HX:    History   Social History  . Marital Status: Married    Spouse Name: N/A    Number of Children: N/A  . Years of Education: N/A   Occupational History  . Not on file.   Social History Main Topics  . Smoking status: Former Smoker    Quit date: 02/03/1974  . Smokeless tobacco: Not on file  . Alcohol Use: No  . Drug Use: No  . Sexually Active: Not on file   Other Topics Concern  . Not on file   Social History Narrative  . No narrative on file     ROS:  All 11 ROS were addressed and are negative except what is stated in the HPI  PHYSICAL EXAM Filed Vitals:   12/28/11 1130  BP: 134/74  Pulse:  98  Resp: 24   General: Well developed, well nourished, in no acute distress Head: Eyes PERRLA, No xanthomas.   Normal cephalic and atramatic  Lungs:   Crackles bilaterally about 1/2 way up lung fields Heart:  Irregularly irregular S1 S2 Pulses are 2+ & equal.            No carotid bruit. No JVD.  No abdominal bruits. No femoral bruits. Abdomen: Bowel sounds are positive, abdomen soft and non-tender without masses Extremities:   No clubbing, cyanosis or edema.  DP +1 Neuro: Alert and oriented X 3. Psych:  Good affect, responds appropriately   Labs:   Lab Results  Component Value Date   WBC 7.8 12/28/2011   HGB 13.2 12/28/2011   HCT 39.6 12/28/2011   MCV 88.2 12/28/2011   PLT 233 12/28/2011    Lab 12/28/11 0645  NA 139  K 3.3*  CL 101  CO2 29  BUN 18  CREATININE 0.68  CALCIUM 9.0  PROT 6.7  BILITOT 0.7  ALKPHOS 173*  ALT 185*  AST 236*  GLUCOSE 126*    No results found for this basename: PTT   Lab Results  Component Value Date   INR 1.81*  12/28/2011   INR 1.03 06/08/2010   INR 0.98 07/18/2009     Lab Results  Component Value Date   CHOL 134 10/03/2010   CHOL 111 06/08/2010   CHOL  Value: 121        ATP III CLASSIFICATION:  <200     mg/dL   Desirable  045-409  mg/dL   Borderline High  >=811    mg/dL   High        10/18/7827   Lab Results  Component Value Date   HDL 62.80 10/03/2010   HDL 53 06/08/2010   HDL 56 07/19/2009   Lab Results  Component Value Date   LDLCALC 58 10/03/2010   LDLCALC  Value: 49        Total Cholesterol/HDL:CHD Risk Coronary Heart Disease Risk Table                     Men   Women  1/2 Average Risk   3.4   3.3  Average Risk       5.0   4.4  2 X Average Risk   9.6   7.1  3 X Average Risk  23.4   11.0        Use the calculated Patient Ratio above and the CHD Risk Table to determine the patient's CHD Risk.        ATP III CLASSIFICATION (LDL):  <100     mg/dL   Optimal  562-130  mg/dL   Near or Above                    Optimal  130-159  mg/dL   Borderline  865-784  mg/dL   High  >696     mg/dL   Very High 03/14/5282   LDLCALC  Value: 52        Total Cholesterol/HDL:CHD Risk Coronary Heart Disease Risk Table                     Men   Women  1/2 Average Risk   3.4   3.3  Average Risk       5.0   4.4  2 X Average Risk   9.6   7.1  3 X Average  Risk  23.4   11.0        Use the calculated Patient Ratio above and the CHD Risk Table to determine the patient's CHD Risk.        ATP III CLASSIFICATION (LDL):  <100     mg/dL   Optimal  811-914  mg/dL   Near or Above                    Optimal  130-159  mg/dL   Borderline  782-956  mg/dL   High  >213     mg/dL   Very High 0/86/5784   Lab Results  Component Value Date   TRIG 66.0 10/03/2010   TRIG 43 06/08/2010   TRIG 63 07/19/2009   Lab Results  Component Value Date   CHOLHDL 2 10/03/2010   CHOLHDL 2.1 06/08/2010   CHOLHDL 2.2 07/19/2009   No results found for this basename: LDLDIRECT      Radiology:  *RADIOLOGY REPORT*  Clinical Data: Shortness of breath, congestion, chest pain    PORTABLE CHEST - 1 VIEW  Comparison: 09/22/2005  Findings: Cardiomegaly noted with increased diffuse symmetric  interstitial changes compatible with edema. Mild CHF is favored.  Minimal basilar atelectasis. No large effusion or pneumothorax.  Atherosclerosis of the aorta.  IMPRESSION:  Mild CHF pattern  Original Report Authenticated By: Judie Petit. Shick, M.D.   EKG:  Atrial fibrillation with nonspecific intraventricular conduction delay  ASSESSMENT:  1.  Acute diastolic CHF 2.  Atrial fibrillation with RVR 3.  Systemic anticoagulation with subtherapeutic INR 4.  Chest pain with negative cardiac enzymes thus far - EKG with nonspecific IVCD 5.  CAD with remote MI 2000 with PCI to the LAD and left circ and failed PCI of OM 2009 6.  Normal LVF by cath 2009 7.  HTN 8.  Dyslipidemia 9.  Hypokalemia 10.  Elevated LFT's most likely secondary to hepatic congestion from CHF    PLAN:   1.  Admit to tele bed 2.  IV Cardizem gtt for rate control 3.  IV Lasix 20mg  IV now ( already received 20mg  IV Lasix in ER) then 40mg  IV BID 4.  Cycle cardiac enzymes 5.  2D echo to reassess LVF 6.  Coumadin per pharmacy protocol 7.  Replete potassium - done in ER 8.  Check TSH  Quintella Reichert, MD  12/28/2011  12:34 PM

## 2011-12-28 NOTE — ED Provider Notes (Signed)
History     CSN: [id number]  Arrival date & time 12/28/11  7829   First MD Initiated Contact with Patient 12/28/11 [phone]      Chief Complaint  Patient presents with  . Shortness of Breath    (Consider location/radiation/quality/duration/timing/severity/associated sxs/prior treatment) The history is provided by the patient.   Patty [name] is a 78 y.o. female hx of CAD s/p multiple stents, HL, HTN, granulosa cell tumor here with SOB, palpitations. She was diagnosed with new onset A. fib 5 days ago and was started on coumadin. Her coumadin was still subtherapeutic and she was not bridged with lovenox. No echo was done. Since last night, she became more dyspneic and had worsening SOB. No hx of CHF. Has nonproductive cough and no fevers. No hx of DVT/PE but she noticed that her calfs are more swollen.   Past Medical History  Diagnosis Date  . IHD (ischemic heart disease)     Remote MI in 2000 with stent to LAD and LCX. Failed  intervention to OM in 2009. Managed medically since that time.  . MI, old 59  . Hypertension   . Hyperlipidemia   . Tobacco abuse   . Obesity   . Granulosa cell tumor     Has had several surgeries due to her tumor  . Diverticulitis   . Stroke     Old CVA noted on MRI in June 2011  . Cancer     Past Surgical History  Procedure Date  . Cardiac catheterization 09/03/2007    EF 70%; Failed attempt at PCI to OM  . Cardiac catheterization 11/01/2003    EF 70%  . Cholecystectomy   . Parotid mass removed   . US echocardiography 11/21/2003    EF 55-60%  . Cardiovascular stress test 03/23/2009    EF 72%  . Coronary stent placement 2000    LAD and LCX  . Hernia repair laparoscopic 2005 and open 2007  . Abdominal hysterectomy   . Small intestine surgery   . Colon surgery 2004    colectomy for diverticulosis  . Granulosa cell tumor excision 2004, 2007, 2011     Family History  Problem Relation Age of Onset  . Heart attack Father     History    Substance Use Topics  . Smoking status: Former Smoker    Quit date: 02/03/1974  . Smokeless tobacco: Not on file  . Alcohol Use: No    OB History    Grav Para Term Preterm Abortions TAB SAB Ect Mult Living                  Review of Systems  Respiratory: Positive for shortness of breath.   Cardiovascular: Positive for palpitations.  All other systems reviewed and are negative.    Allergies  Bee venom and Other  Home Medications   Current Outpatient Rx  Name  Route  Sig  Dispense  Refill  . ACETAMINOPHEN 500 MG PO TABS   Oral   Take 500 mg by mouth every 6 (six) hours as needed. For pain         . AMLODIPINE-ATORVASTATIN 10-80 MG PO TABS   Oral   Take 1 tablet by mouth daily.           . ASPIRIN EC 81 MG PO TBEC   Oral   Take 81 mg by mouth daily.         Marland Kitchen CALTRATE 600 PO   Oral   Take  1 tablet by mouth daily.          . COQ10 PO   Oral   Take 100 mg by mouth daily.          Marland Kitchen HYDROCHLOROTHIAZIDE 12.5 MG PO TABS   Oral   Take 12.5 mg by mouth daily.         . ISOSORBIDE MONONITRATE ER 60 MG PO TB24   Oral   Take 60 mg by mouth daily.           Marland Kitchen LISINOPRIL 40 MG PO TABS   Oral   Take 40 mg by mouth daily.           Marland Kitchen METOPROLOL TARTRATE 50 MG PO TABS   Oral   Take 50 mg by mouth 2 (two) times daily.           Marland Kitchen ONE-DAILY MULTI VITAMINS PO TABS   Oral   Take 1 tablet by mouth daily.           Marland Kitchen VITAMIN E 400 UNITS PO CAPS   Oral   Take 400 Units by mouth daily.           . WARFARIN SODIUM 2 MG PO TABS   Oral   Take 2 mg by mouth every evening.         Marland Kitchen NITROGLYCERIN 0.4 MG SL SUBL   Sublingual   Place 0.4 mg under the tongue every 5 (five) minutes as needed. For chest pain           BP 149/92  Pulse 120  Resp 16  SpO2 90%  Physical Exam  Nursing note and vitals reviewed. Constitutional: She is oriented to person, place, and time. She appears well-developed and well-nourished.  HENT:  Head:  Normocephalic.  Mouth/Throat: Oropharynx is clear and moist.  Eyes: Conjunctivae normal are normal. Pupils are equal, round, and reactive to light.  Neck: Normal range of motion. Neck supple.  Cardiovascular: Normal heart sounds.        + tachycardic, irregular   Pulmonary/Chest: Effort normal. No respiratory distress.       Crackles bilateral bases   Abdominal: Soft. Bowel sounds are normal. She exhibits no distension. There is no tenderness. There is no rebound.  Musculoskeletal: Normal range of motion.       2+ edema. Minimal bilateral calf tenderness.   Neurological: She is alert and oriented to person, place, and time. No cranial nerve deficit.  Skin: Skin is warm and dry.  Psychiatric: She has a normal mood and affect. Her behavior is normal. Judgment and thought content normal.    ED Course  Procedures (including critical care time)  CRITICAL CARE Performed by: Silverio Lay, DAVID   Total critical care time: 30 min   Critical care time was exclusive of separately billable procedures and treating other patients.  Critical care was necessary to treat or prevent imminent or life-threatening deterioration.  Critical care was time spent personally by me on the following activities: development of treatment plan with patient and/or surrogate as well as nursing, discussions with consultants, evaluation of patient's response to treatment, examination of patient, obtaining history from patient or surrogate, ordering and performing treatments and interventions, ordering and review of laboratory studies, ordering and review of radiographic studies, pulse oximetry and re-evaluation of patient's condition.   Labs Reviewed  COMPREHENSIVE METABOLIC PANEL - Abnormal; Notable for the following:    Potassium 3.3 (*)     Glucose, Bld 126 (*)  Albumin 3.4 (*)     AST 236 (*)     ALT 185 (*)     Alkaline Phosphatase 173 (*)     GFR calc non Af Amer 81 (*)     All other components within normal  limits  CBC WITH DIFFERENTIAL - Abnormal; Notable for the following:    Neutrophils Relative 78 (*)     All other components within normal limits  PROTIME-INR - Abnormal; Notable for the following:    Prothrombin Time 20.3 (*)     INR 1.81 (*)     All other components within normal limits  PRO B NATRIURETIC PEPTIDE - Abnormal; Notable for the following:    Pro B Natriuretic peptide (BNP) 1212.0 (*)     All other components within normal limits  POCT I-STAT TROPONIN I   Ct Angio Chest Pe W/cm &/or Wo Cm  12/28/2011  *RADIOLOGY REPORT*  Clinical Data: Shortness of breath and atrial fibrillation.  CT ANGIOGRAPHY CHEST  Technique:  Multidetector CT imaging of the chest using the standard protocol during bolus administration of intravenous contrast. Multiplanar reconstructed images including MIPs were obtained and reviewed to evaluate the vascular anatomy.  Contrast: OMNIPAQUE IOHEXOL 350 MG/ML SOLN  Comparison: 06/09/2008  Findings: The pulmonary arteries are adequately opacified.  There is no evidence of pulmonary embolism.  Lungs show evidence of congestive heart failure with moderate venous congestion and early airspace edema present in both lower lung zones.  There also are small bilateral pleural effusions.  The heart is moderately enlarged and prominent calcified atherosclerotic plaque is noted in the distribution of the LAD and left circumflex coronary arteries.  There is stable mild dilatation of the ascending thoracic aorta measuring 4.3 cm in greatest diameter.  The aorta is heavily calcified.  No pericardial effusion.  No pulmonary masses or enlarged lymph nodes are identified.  Bony structures are unremarkable.  Visualized upper abdomen shows reflux of contrast into the IVC and all three hepatic veins.  Findings are consistent with right heart failure.  There is a small hiatal hernia.  IMPRESSION:  1.  No evidence of acute pulmonary embolism. 2.  Congestive heart failure with moderate  pulmonary edema present as well small bilateral pleural effusions.  Associated cardiomegaly and coronary atherosclerosis identified.  There is evidence of right heart failure. 3.  Stable aneurysmal disease of the ascending thoracic aorta measuring 4.3 cm in greatest diameter.   Original Report Authenticated By: Irish Lack, M.D.    Dg Chest Portable 1 View  12/28/2011  *RADIOLOGY REPORT*  Clinical Data: Shortness of breath, congestion, chest pain  PORTABLE CHEST - 1 VIEW  Comparison: 09/22/2005  Findings: Cardiomegaly noted with increased diffuse symmetric interstitial changes compatible with edema.  Mild CHF is favored. Minimal basilar atelectasis.  No large effusion or pneumothorax. Atherosclerosis of the aorta.  IMPRESSION: Mild CHF pattern   Original Report Authenticated By: Judie Petit. Miles Costain, M.D.      No diagnosis found.   Date: 12/28/2011  Rate: 135  Rhythm: atrial fibrillation  QRS Axis: normal  Intervals: normal  ST/T Wave abnormalities: nonspecific ST changes  Conduction Disutrbances:left bundle branch block and nonspecific intraventricular conduction delay  Narrative Interpretation:   Old EKG Reviewed: changes noted    MDM  Patty [name] is a 78 y.o. female here with new onset afib with subtherapuetic INR with hypoxia and tachycardia. Will need to r/o PE vs CHF. Will get CT PA, labs, BNP, CXR. Will slow down with cardizem and  admit for monitoring.   10:16 AM Labs show BNP 1200, no baseline. CXR showed mild CHF. She is given lasix 20mg  IV (never on lasix prior). CT angio chest showed no PE. Her HR still around 110-120 after cardizem so she is started on a drip. I discussed with Dr. Caspar Desanctis cardiologist, who will see the patient in the ED.        Richardean Canal, MD 12/28/11 1017

## 2011-12-28 NOTE — ED Notes (Addendum)
C/o sob, increased wob, speaking in short phrases, alert, NAD, calm, interactive, resps e/ mildly labored, onset this am, relates sx to her heart, significant cardiac & respiritory hx. Recent CA hx.

## 2011-12-28 NOTE — ED Notes (Signed)
Pt in w/c, straight back to exam room for EKG & completion of triage, husband present, pt self transferred from w/c to stretcher.

## 2011-12-28 NOTE — Progress Notes (Signed)
ANTICOAGULATION CONSULT NOTE - Initial Consult  Pharmacy Consult for coumadin Indication: atrial fibrillation  Allergies  Allergen Reactions  . Bee Venom Anaphylaxis  . Other     Pain medications cause severe vomiting.    Patient Measurements:    Vital Signs: BP: 128/82 mmHg (11/24 1354) Pulse Rate: 96  (11/24 1354)  Labs:  Basename 12/28/11 0707 12/28/11 0645  HGB -- 13.2  HCT -- 39.6  PLT -- 233  APTT -- --  LABPROT 20.3* --  INR 1.81* --  HEPARINUNFRC -- --  CREATININE -- 0.68  CKTOTAL -- --  CKMB -- --  TROPONINI -- --    The CrCl is unknown because both a height and weight (above a minimum accepted value) are required for this calculation.   Medical History: Past Medical History  Diagnosis Date  . IHD (ischemic heart disease)     Remote MI in 2000 with stent to LAD and LCX. Failed  intervention to OM in 2009. Managed medically since that time.  . MI, old 89  . Hypertension   . Hyperlipidemia   . Tobacco abuse   . Obesity   . Granulosa cell tumor     Has had several surgeries due to her tumor  . Diverticulitis   . Stroke     Old CVA noted on MRI in June 2011  . Cancer     Medications:   (Not in a hospital admission)  Assessment: 78 yo lady recently started on coumadin for afib.  Admit INR subtherapeutic at 1.81.  Home dose reported as 2 mg daily. Goal of Therapy:  INR 2-3 Monitor platelets by anticoagulation protocol: Yes   Plan:  Coumadin 3 mg po today. Check daily PT/INR Monitor for s&s bleeding.  Aiden Rao Poteet 12/28/2011,2:34 PM

## 2011-12-29 DIAGNOSIS — I4891 Unspecified atrial fibrillation: Secondary | ICD-10-CM | POA: Diagnosis not present

## 2011-12-29 DIAGNOSIS — I2510 Atherosclerotic heart disease of native coronary artery without angina pectoris: Secondary | ICD-10-CM | POA: Diagnosis not present

## 2011-12-29 DIAGNOSIS — I5031 Acute diastolic (congestive) heart failure: Secondary | ICD-10-CM | POA: Diagnosis not present

## 2011-12-29 DIAGNOSIS — I509 Heart failure, unspecified: Secondary | ICD-10-CM | POA: Diagnosis not present

## 2011-12-29 LAB — BASIC METABOLIC PANEL
BUN: 18 mg/dL (ref 6–23)
CO2: 32 mEq/L (ref 19–32)
Calcium: 8.4 mg/dL (ref 8.4–10.5)
Creatinine, Ser: 0.9 mg/dL (ref 0.50–1.10)
Glucose, Bld: 112 mg/dL — ABNORMAL HIGH (ref 70–99)

## 2011-12-29 LAB — PROTIME-INR: INR: 2.34 — ABNORMAL HIGH (ref 0.00–1.49)

## 2011-12-29 MED ORDER — WARFARIN SODIUM 2 MG PO TABS
2.0000 mg | ORAL_TABLET | Freq: Once | ORAL | Status: AC
  Administered 2011-12-29: 2 mg via ORAL
  Filled 2011-12-29: qty 1

## 2011-12-29 MED ORDER — DILTIAZEM HCL 100 MG IV SOLR
5.0000 mg/h | INTRAVENOUS | Status: AC
  Filled 2011-12-29: qty 100

## 2011-12-29 MED ORDER — DILTIAZEM HCL 60 MG PO TABS
60.0000 mg | ORAL_TABLET | Freq: Four times a day (QID) | ORAL | Status: DC
  Administered 2011-12-29 (×3): 60 mg via ORAL
  Filled 2011-12-29 (×8): qty 1

## 2011-12-29 MED ORDER — POTASSIUM CHLORIDE CRYS ER 20 MEQ PO TBCR
40.0000 meq | EXTENDED_RELEASE_TABLET | Freq: Two times a day (BID) | ORAL | Status: DC
  Administered 2011-12-29 – 2012-01-01 (×7): 40 meq via ORAL
  Filled 2011-12-29 (×10): qty 2

## 2011-12-29 NOTE — Progress Notes (Signed)
2030 Patient was being assisted from the bed to the bedside commode. Patient HR increased to 140's non-sustained and she was asymptomatic. Patient assisted back to bed and heart rate returned back to 90's to low 100's. HR is currently 70 bpm at rest.

## 2011-12-29 NOTE — Progress Notes (Signed)
ANTICOAGULATION CONSULT NOTE  Pharmacy Consult for coumadin Indication: atrial fibrillation  Allergies  Allergen Reactions  . Bee Venom Anaphylaxis  . Other     Pain medications cause severe vomiting.    Patient Measurements: Height: 5' (152.4 cm) Weight: 176 lb 12.8 oz (80.196 kg) (scale b) IBW/kg (Calculated) : 45.5   Vital Signs: Temp: 97.6 F (36.4 C) (11/25 1050) Temp src: Oral (11/25 1050) BP: 113/48 mmHg (11/25 1050) Pulse Rate: 84  (11/25 1050)  Labs:  Basename 12/29/11 0609 12/28/11 2151 12/28/11 1549 12/28/11 0707 12/28/11 0645  HGB -- -- -- -- 13.2  HCT -- -- -- -- 39.6  PLT -- -- -- -- 233  APTT -- -- -- -- --  LABPROT 24.6* -- -- 20.3* --  INR 2.34* -- -- 1.81* --  HEPARINUNFRC -- -- -- -- --  CREATININE 0.90 -- 0.73 -- 0.68  CKTOTAL -- -- -- -- --  CKMB -- -- -- -- --  TROPONINI 0.38* 0.65* 0.54* -- --    Estimated Creatinine Clearance: 47.5 ml/min (by C-G formula based on Cr of 0.9).    Assessment: 78 yo lady recently started on coumadin for afib.  Admit INR subtherapeutic at 1.81 yesterday.  Home dose reported as 2 mg daily - last dose 12/16/11.  Today her INR is therapeutic at 2.34 after 3 mg given yesterday.  4.3 sec jump in protime.  No bleeding reported.   Goal of Therapy:  INR 2-3  Plan:  Coumadin 2 mg po today. Check daily PT/INR Monitor for s&s bleeding. Herby Abraham, Pharm.D. [phone] 12/29/2011 11:03 AM

## 2011-12-29 NOTE — Plan of Care (Signed)
Problem: Phase I Progression Outcomes Goal: EF % per last Echo/documented,Core Reminder form on chart Outcome: Not Applicable Date Met:  12/29/11 Echo done 12/29/2011 showed EF 60-65% Louretta Parma, RN

## 2011-12-29 NOTE — Progress Notes (Signed)
  Echocardiogram 2D Echocardiogram has been performed.  Cathie Beams 12/29/2011, 8:46 AM

## 2011-12-29 NOTE — Progress Notes (Signed)
SUBJECTIVE:  Feels better.  Diuresed well.  SHOB significantly better.  OBJECTIVE:   Vitals:   Filed Vitals:   12/28/11 2118 12/28/11 2143 12/29/11 0519 12/29/11 1050  BP: 90/41 95/63 100/46 113/48  Pulse: 94 96 100 84  Temp: 98.6 F (37 C)  98.3 F (36.8 C) 97.6 F (36.4 C)  TempSrc: Oral  Oral Oral  Resp: 18  18   Height:      Weight:   80.196 kg (176 lb 12.8 oz)   SpO2: 93%  93% 97%   I&O's:   Intake/Output Summary (Last 24 hours) at 12/29/11 1134 Last data filed at 12/29/11 4098  Gross per 24 hour  Intake    243 ml  Output   2450 ml  Net  -2207 ml   TELEMETRY: Reviewed telemetry pt in AFib with variable rate control.:     PHYSICAL EXAM General: Well developed, well nourished, in no acute distress Head:   Normal cephalic and atramatic  Lungs:   Clear bilaterally to auscultation and percussion. Heart:  Irregularly irregular, intermittent rate control Abdomen:  abdomen soft and non-tender Msk:  Back normal, normal gait. Normal strength and tone for age. Extremities: No clubbing, cyanosis or edema.  DP +1 Neuro: Alert and oriented X 3. Psych:  Good affect, responds appropriately   LABS: Basic Metabolic Panel:  Basename 12/29/11 0609 12/28/11 1549  NA 143 141  K 3.0* 3.1*  CL 101 100  CO2 32 31  GLUCOSE 112* 151*  BUN 18 15  CREATININE 0.90 0.73  CALCIUM 8.4 8.9  MG -- --  PHOS -- --   Liver Function Tests:  [facility] 12/28/11 1549 12/28/11 0645  AST 141* 236*  ALT 152* 185*  ALKPHOS 166* 173*  BILITOT 0.7 0.7  PROT 6.8 6.7  ALBUMIN 3.4* 3.4*   No results found for this basename: LIPASE:2,AMYLASE:2 in the last 72 hours CBC:  Basename 12/28/11 0645  WBC 7.8  NEUTROABS 6.1  HGB 13.2  HCT 39.6  MCV 88.2  PLT 233   Cardiac Enzymes:  Basename 12/29/11 0609 12/28/11 2151 12/28/11 1549  CKTOTAL -- -- --  CKMB -- -- --  CKMBINDEX -- -- --  TROPONINI 0.38* 0.65* 0.54*   BNP: No components found with this basename: POCBNP:3 D-Dimer: No  results found for this basename: DDIMER:2 in the last 72 hours Hemoglobin A1C: No results found for this basename: HGBA1C in the last 72 hours Fasting Lipid Panel: No results found for this basename: CHOL,HDL,LDLCALC,TRIG,CHOLHDL,LDLDIRECT in the last 72 hours Thyroid Function Tests:  Basename 12/28/11 1549  TSH 3.050  T4TOTAL --  T3FREE --  THYROIDAB --   Anemia Panel: No results found for this basename: VITAMINB12,FOLATE,FERRITIN,TIBC,IRON,RETICCTPCT in the last 72 hours Coag Panel:   Lab Results  Component Value Date   INR 2.34* 12/29/2011   INR 1.81* 12/28/2011   INR 1.03 06/08/2010    RADIOLOGY: Ct Angio Chest Pe W/cm &/or Wo Cm  12/28/2011  *RADIOLOGY REPORT*  Clinical Data: Shortness of breath and atrial fibrillation.  CT ANGIOGRAPHY CHEST  Technique:  Multidetector CT imaging of the chest using the standard protocol during bolus administration of intravenous contrast. Multiplanar reconstructed images including MIPs were obtained and reviewed to evaluate the vascular anatomy.  Contrast: OMNIPAQUE IOHEXOL 350 MG/ML SOLN  Comparison: 06/09/2008  Findings: The pulmonary arteries are adequately opacified.  There is no evidence of pulmonary embolism.  Lungs show evidence of congestive heart failure with moderate venous congestion and early airspace edema present  in both lower lung zones.  There also are small bilateral pleural effusions.  The heart is moderately enlarged and prominent calcified atherosclerotic plaque is noted in the distribution of the LAD and left circumflex coronary arteries.  There is stable mild dilatation of the ascending thoracic aorta measuring 4.3 cm in greatest diameter.  The aorta is heavily calcified.  No pericardial effusion.  No pulmonary masses or enlarged lymph nodes are identified.  Bony structures are unremarkable.  Visualized upper abdomen shows reflux of contrast into the IVC and all three hepatic veins.  Findings are consistent with right heart  failure.  There is a small hiatal hernia.  IMPRESSION:  1.  No evidence of acute pulmonary embolism. 2.  Congestive heart failure with moderate pulmonary edema present as well small bilateral pleural effusions.  Associated cardiomegaly and coronary atherosclerosis identified.  There is evidence of right heart failure. 3.  Stable aneurysmal disease of the ascending thoracic aorta measuring 4.3 cm in greatest diameter.   Original Report Authenticated By: Irish Lack, M.D.    Dg Chest Portable 1 View  12/28/2011  *RADIOLOGY REPORT*  Clinical Data: Shortness of breath, congestion, chest pain  PORTABLE CHEST - 1 VIEW  Comparison: 09/22/2005  Findings: Cardiomegaly noted with increased diffuse symmetric interstitial changes compatible with edema.  Mild CHF is favored. Minimal basilar atelectasis.  No large effusion or pneumothorax. Atherosclerosis of the aorta.  IMPRESSION: Mild CHF pattern   Original Report Authenticated By: Judie Petit. Shick, M.D.       ASSESSMENT: AFib with intermittent RVR.  Diastolic dysfunction.  PLAN:  Continue diuresis as tolerated.  Rate control with cardizem.  Will switch to PO.  Replace potassium.    CAD has been medically managed most recently.  Mildly increased troponin in the setting of fluid overload and increased heart rate.  Would not be too aggressive to cath unless sx return.  Known circumflex disease.  Coumadin for stroke prevention.  If she has recurrent sx, would consider cardioversion in a few weeks, of her AFib.    Corky Crafts., MD  12/29/2011  11:34 AM

## 2011-12-30 DIAGNOSIS — I5033 Acute on chronic diastolic (congestive) heart failure: Secondary | ICD-10-CM | POA: Diagnosis not present

## 2011-12-30 DIAGNOSIS — I509 Heart failure, unspecified: Secondary | ICD-10-CM | POA: Diagnosis not present

## 2011-12-30 DIAGNOSIS — Z8673 Personal history of transient ischemic attack (TIA), and cerebral infarction without residual deficits: Secondary | ICD-10-CM | POA: Diagnosis not present

## 2011-12-30 DIAGNOSIS — E876 Hypokalemia: Secondary | ICD-10-CM | POA: Diagnosis present

## 2011-12-30 DIAGNOSIS — E785 Hyperlipidemia, unspecified: Secondary | ICD-10-CM | POA: Diagnosis not present

## 2011-12-30 DIAGNOSIS — I2510 Atherosclerotic heart disease of native coronary artery without angina pectoris: Secondary | ICD-10-CM | POA: Diagnosis not present

## 2011-12-30 DIAGNOSIS — Z7901 Long term (current) use of anticoagulants: Secondary | ICD-10-CM | POA: Diagnosis not present

## 2011-12-30 DIAGNOSIS — I10 Essential (primary) hypertension: Secondary | ICD-10-CM | POA: Diagnosis not present

## 2011-12-30 DIAGNOSIS — I5031 Acute diastolic (congestive) heart failure: Secondary | ICD-10-CM | POA: Diagnosis not present

## 2011-12-30 DIAGNOSIS — E669 Obesity, unspecified: Secondary | ICD-10-CM | POA: Diagnosis not present

## 2011-12-30 DIAGNOSIS — R0602 Shortness of breath: Secondary | ICD-10-CM | POA: Diagnosis not present

## 2011-12-30 DIAGNOSIS — I4891 Unspecified atrial fibrillation: Secondary | ICD-10-CM | POA: Diagnosis not present

## 2011-12-30 DIAGNOSIS — Z87891 Personal history of nicotine dependence: Secondary | ICD-10-CM | POA: Diagnosis not present

## 2011-12-30 LAB — BASIC METABOLIC PANEL
Calcium: 8.5 mg/dL (ref 8.4–10.5)
GFR calc Af Amer: 64 mL/min — ABNORMAL LOW (ref >90)
GFR calc non Af Amer: 55 mL/min — ABNORMAL LOW (ref >90)
Glucose, Bld: 103 mg/dL — ABNORMAL HIGH (ref 70–99)
Potassium: 3.6 mEq/L (ref 3.5–5.1)
Sodium: 142 mEq/L (ref 135–145)

## 2011-12-30 LAB — PROTIME-INR: INR: 2.75 — ABNORMAL HIGH (ref 0.00–1.49)

## 2011-12-30 MED ORDER — DILTIAZEM HCL ER COATED BEADS 240 MG PO CP24
240.0000 mg | ORAL_CAPSULE | Freq: Every day | ORAL | Status: DC
  Administered 2011-12-30 – 2012-01-02 (×3): 240 mg via ORAL
  Filled 2011-12-30 (×4): qty 1

## 2011-12-30 MED ORDER — SODIUM CHLORIDE 0.9 % IV SOLN: INTRAVENOUS | Status: DC

## 2011-12-30 MED ORDER — WARFARIN SODIUM 2 MG PO TABS
2.0000 mg | ORAL_TABLET | Freq: Once | ORAL | Status: AC
  Administered 2011-12-30: 2 mg via ORAL
  Filled 2011-12-30: qty 1

## 2011-12-30 NOTE — Progress Notes (Signed)
SUBJECTIVE:  Feels better.  Diuresed well.  SHOB significantly better.   Still has some shortness of breath with walking to the bathroom.  OBJECTIVE:   Vitals:   Filed Vitals:   12/29/11 1445 12/29/11 1512 12/29/11 2039 12/30/11 0446  BP: 92/43 102/52 116/52 109/44  Pulse: 52  79 63  Temp: 97.3 F (36.3 C)  98 F (36.7 C) 98.5 F (36.9 C)  TempSrc: Axillary  Oral Oral  Resp: 18  20 16   Height:      Weight:    79.9 kg (176 lb 2.4 oz)  SpO2: 97%  94% 95%   I&O's:    Intake/Output Summary (Last 24 hours) at 12/30/11 0908 Last data filed at 12/30/11 0859  Gross per 24 hour  Intake   1265 ml  Output   1050 ml  Net    215 ml   TELEMETRY: Reviewed telemetry pt in AFib with variable rate control.:     PHYSICAL EXAM General: Well developed, well nourished, in no acute distress Head:   Normal cephalic and atramatic  Lungs:   Clear bilaterally to auscultation and percussion. Heart:  Irregularly irregular, intermittent rate control Abdomen:  abdomen soft and non-tender Msk:  Back normal, normal gait. Normal strength and tone for age. Extremities: No clubbing, cyanosis or edema.  DP +1 Neuro: Alert and oriented X 3. Psych:  Good affect, responds appropriately   LABS: Basic Metabolic Panel:  Basename 12/30/11 0615 12/29/11 0609  NA 142 143  K 3.6 3.0*  CL 104 101  CO2 31 32  GLUCOSE 103* 112*  BUN 21 18  CREATININE 0.95 0.90  CALCIUM 8.5 8.4  MG -- --  PHOS -- --   Liver Function Tests:  [facility] 12/28/11 1549 12/28/11 0645  AST 141* 236*  ALT 152* 185*  ALKPHOS 166* 173*  BILITOT 0.7 0.7  PROT 6.8 6.7  ALBUMIN 3.4* 3.4*   No results found for this basename: LIPASE:2,AMYLASE:2 in the last 72 hours CBC:  Basename 12/28/11 0645  WBC 7.8  NEUTROABS 6.1  HGB 13.2  HCT 39.6  MCV 88.2  PLT 233   Cardiac Enzymes:  Basename 12/29/11 0609 12/28/11 2151 12/28/11 1549  CKTOTAL -- -- --  CKMB -- -- --  CKMBINDEX -- -- --  TROPONINI 0.38* 0.65* 0.54*    BNP: No components found with this basename: POCBNP:3 D-Dimer: No results found for this basename: DDIMER:2 in the last 72 hours Hemoglobin A1C: No results found for this basename: HGBA1C in the last 72 hours Fasting Lipid Panel: No results found for this basename: CHOL,HDL,LDLCALC,TRIG,CHOLHDL,LDLDIRECT in the last 72 hours Thyroid Function Tests:  Basename 12/28/11 1549  TSH 3.050  T4TOTAL --  T3FREE --  THYROIDAB --   Anemia Panel: No results found for this basename: VITAMINB12,FOLATE,FERRITIN,TIBC,IRON,RETICCTPCT in the last 72 hours Coag Panel:   Lab Results  Component Value Date   INR 2.75* 12/30/2011   INR 2.34* 12/29/2011   INR 1.81* 12/28/2011    RADIOLOGY: Ct Angio Chest Pe W/cm &/or Wo Cm  12/28/2011  *RADIOLOGY REPORT*  Clinical Data: Shortness of breath and atrial fibrillation.  CT ANGIOGRAPHY CHEST  Technique:  Multidetector CT imaging of the chest using the standard protocol during bolus administration of intravenous contrast. Multiplanar reconstructed images including MIPs were obtained and reviewed to evaluate the vascular anatomy.  Contrast: OMNIPAQUE IOHEXOL 350 MG/ML SOLN  Comparison: 06/09/2008  Findings: The pulmonary arteries are adequately opacified.  There is no evidence of pulmonary embolism.  Lungs  show evidence of congestive heart failure with moderate venous congestion and early airspace edema present in both lower lung zones.  There also are small bilateral pleural effusions.  The heart is moderately enlarged and prominent calcified atherosclerotic plaque is noted in the distribution of the LAD and left circumflex coronary arteries.  There is stable mild dilatation of the ascending thoracic aorta measuring 4.3 cm in greatest diameter.  The aorta is heavily calcified.  No pericardial effusion.  No pulmonary masses or enlarged lymph nodes are identified.  Bony structures are unremarkable.  Visualized upper abdomen shows reflux of contrast into the IVC  and all three hepatic veins.  Findings are consistent with right heart failure.  There is a small hiatal hernia.  IMPRESSION:  1.  No evidence of acute pulmonary embolism. 2.  Congestive heart failure with moderate pulmonary edema present as well small bilateral pleural effusions.  Associated cardiomegaly and coronary atherosclerosis identified.  There is evidence of right heart failure. 3.  Stable aneurysmal disease of the ascending thoracic aorta measuring 4.3 cm in greatest diameter.   Original Report Authenticated By: Irish Lack, M.D.    Dg Chest Portable 1 View  12/28/2011  *RADIOLOGY REPORT*  Clinical Data: Shortness of breath, congestion, chest pain  PORTABLE CHEST - 1 VIEW  Comparison: 09/22/2005  Findings: Cardiomegaly noted with increased diffuse symmetric interstitial changes compatible with edema.  Mild CHF is favored. Minimal basilar atelectasis.  No large effusion or pneumothorax. Atherosclerosis of the aorta.  IMPRESSION: Mild CHF pattern   Original Report Authenticated By: Judie Petit. Shick, M.D.       ASSESSMENT: AFib with intermittent RVR.  Diastolic dysfunction.  Heart rate was very well controlled during the day yesterday.  It has been almost 12 hours since she got her last dose of diltiazem and her heart rate is now in the 120 range.  PLAN:  Continue diuresis as tolerated.  Rate control with cardizem.  Will switch to PO sustained release.  Replace potassium.  Will also consider TEE/cardioversion for tomorrow as rate control has been somewhat difficult.  She has been on Coumadin for only a week.      CAD has been medically managed most recently.  Mildly increased troponin in the setting of fluid overload and increased heart rate.  Would not be too aggressive to cath unless sx return.  Known circumflex disease.  Coumadin for stroke prevention.  If rate can be controlled in the hospital, could consider discharge.  If she has recurrent sx as an outpatient, would consider cardioversion in a  few weeks.    Corky Crafts., MD  12/30/2011  9:08 AM

## 2011-12-30 NOTE — Progress Notes (Signed)
Page MD 1St 1045;                  2nd 1110 MD reply 1115  Pt stated at 1040 after drink cup of ice water and getting up to the bathroom their chest felt tight. Pt set back in bed and I put her back on 2l o2, VS 138/82, 93% RA Hr 125-140.  98% on 2L O2. Pt also stated they had similar episode yesterday and lasted 10-31min. After O2 and returning to Bed Pt stated they feel much better. MD state my be r/t HR, my need cardoV in AM. Pt HR at 1110 ranging 80-90 Will continue to monitor.

## 2011-12-30 NOTE — Progress Notes (Signed)
ANTICOAGULATION CONSULT NOTE  Pharmacy Consult for coumadin Indication: atrial fibrillation  Allergies  Allergen Reactions  . Bee Venom Anaphylaxis  . Other     Pain medications cause severe vomiting.    Patient Measurements: Height: 5' (152.4 cm) Weight: 176 lb 2.4 oz (79.9 kg) (scale B) IBW/kg (Calculated) : 45.5   Vital Signs: Temp: 98.5 F (36.9 C) (11/26 0446) Temp src: Oral (11/26 0446) BP: 138/82 mmHg (11/26 1046) Pulse Rate: 125  (11/26 1046)  Labs:  Alvira Philips 12/30/11 0615 12/29/11 0609 12/28/11 2151 12/28/11 1549 12/28/11 0707 12/28/11 0645  HGB -- -- -- -- -- 13.2  HCT -- -- -- -- -- 39.6  PLT -- -- -- -- -- 233  APTT -- -- -- -- -- --  LABPROT 27.7* 24.6* -- -- 20.3* --  INR 2.75* 2.34* -- -- 1.81* --  HEPARINUNFRC -- -- -- -- -- --  CREATININE 0.95 0.90 -- 0.73 -- --  CKTOTAL -- -- -- -- -- --  CKMB -- -- -- -- -- --  TROPONINI -- 0.38* 0.65* 0.54* -- --    Estimated Creatinine Clearance: 45 ml/min (by C-G formula based on Cr of 0.95).    Assessment: 78 yo lady recently started on coumadin for afib.  Admit INR subtherapeutic at 1.81.  Home dose reported as 2 mg daily - last dose 12/16/11.  Today her INR is therapeutic at 2.75 after 3 mg given Sunday and 2mg  dose given Monday.  INR on an upward trend. No bleeding reported.   Goal of Therapy:  INR 2-3  Plan:  Coumadin 2 mg po today. Check daily PT/INR Monitor for s&s bleeding. Herby Abraham, Pharm.D. [phone] 12/30/2011 10:46 AM

## 2011-12-30 NOTE — Progress Notes (Signed)
   CARE MANAGEMENT NOTE 12/30/2011  Patient:  Patty [name], Patty [name]   Account Number:  [id number]  Date Initiated:  12/30/2011  Documentation initiated by:  Graden Hoshino  Subjective/Objective Assessment:   Referral for [facility] for CHF program     Action/Plan:   Met with pt and family members, pt selected [facility] for [facility] CHF program [facility] notified   Anticipated DC Date:  12/31/2011   Anticipated DC Plan:  HOME W HOME HEALTH SERVICES         [facility] Choice  HOME HEALTH   Choice offered to / List presented to:  C-1 Patient        HH arranged  HH-1 RN  HH-10 DISEASE MANAGEMENT      HH agency  [facility]   Status of service:  In process, will continue to follow Medicare Important Message given?   (If response is "NO", the following Medicare IM given date fields will be blank) Date Medicare IM given:   Date Additional Medicare IM given:    Discharge Disposition:  HOME W HOME HEALTH SERVICES  Per UR Regulation:    If discussed at Long Length of Stay Meetings, dates discussed:    Comments:

## 2011-12-30 NOTE — Progress Notes (Signed)
[facility]  Patient Status: New  [facility] is providing the following services: RN  If patient discharges after hours, please call [phone].   Patty [name] 12/30/2011, 1:18 PM

## 2011-12-31 ENCOUNTER — Encounter (HOSPITAL_COMMUNITY): Admission: EM | Disposition: A | Discharge status: Home Health | Payer: Self-pay | Source: Home / Self Care

## 2011-12-31 ENCOUNTER — Inpatient Hospital Stay (HOSPITAL_COMMUNITY): Payer: Medicare Other | Admitting: Anesthesiology

## 2011-12-31 ENCOUNTER — Encounter (HOSPITAL_COMMUNITY): Payer: Self-pay | Admitting: Anesthesiology

## 2011-12-31 ENCOUNTER — Encounter (HOSPITAL_COMMUNITY): Payer: Self-pay | Admitting: *Deleted

## 2011-12-31 HISTORY — PX: CARDIOVERSION: SHX1299

## 2011-12-31 HISTORY — PX: TEE WITHOUT CARDIOVERSION: SHX5443

## 2011-12-31 LAB — BASIC METABOLIC PANEL
CO2: 30 mEq/L (ref 19–32)
Calcium: 8.7 mg/dL (ref 8.4–10.5)
Creatinine, Ser: 0.89 mg/dL (ref 0.50–1.10)
GFR calc non Af Amer: 60 mL/min — ABNORMAL LOW (ref >90)

## 2011-12-31 LAB — PROTIME-INR: Prothrombin Time: 27.5 seconds — ABNORMAL HIGH (ref 11.6–15.2)

## 2011-12-31 SURGERY — CARDIOVERSION
Anesthesia: General | Wound class: Clean

## 2011-12-31 SURGERY — ECHOCARDIOGRAM, TRANSESOPHAGEAL
Anesthesia: Moderate Sedation

## 2011-12-31 MED ORDER — SODIUM CHLORIDE 0.9 % IV SOLN
INTRAVENOUS | Status: DC | PRN
  Administered 2011-12-31: 13:00:00 via INTRAVENOUS

## 2011-12-31 MED ORDER — FENTANYL CITRATE 0.05 MG/ML IJ SOLN
INTRAMUSCULAR | Status: AC
  Filled 2011-12-31: qty 2

## 2011-12-31 MED ORDER — AMIODARONE HCL IN DEXTROSE 360-4.14 MG/200ML-% IV SOLN
1.0000 mg/min | INTRAVENOUS | Status: AC
  Administered 2011-12-31 (×2): 1 mg/min via INTRAVENOUS
  Filled 2011-12-31 (×3): qty 200

## 2011-12-31 MED ORDER — LIDOCAINE VISCOUS 2 % MT SOLN
OROMUCOSAL | Status: DC | PRN
  Administered 2011-12-31: 10 mL via OROMUCOSAL

## 2011-12-31 MED ORDER — FENTANYL CITRATE 0.05 MG/ML IJ SOLN
INTRAMUSCULAR | Status: DC | PRN
  Administered 2011-12-31 (×2): 25 ug via INTRAVENOUS

## 2011-12-31 MED ORDER — PROPOFOL 10 MG/ML IV BOLUS
INTRAVENOUS | Status: DC | PRN
  Administered 2011-12-31: 80 mg via INTRAVENOUS

## 2011-12-31 MED ORDER — WARFARIN SODIUM 2 MG PO TABS
2.0000 mg | ORAL_TABLET | Freq: Every day | ORAL | Status: DC
  Administered 2011-12-31: 2 mg via ORAL
  Filled 2011-12-31 (×2): qty 1

## 2011-12-31 MED ORDER — MIDAZOLAM HCL 5 MG/ML IJ SOLN
INTRAMUSCULAR | Status: AC
  Filled 2011-12-31: qty 2

## 2011-12-31 MED ORDER — AMIODARONE HCL IN DEXTROSE 360-4.14 MG/200ML-% IV SOLN
0.5000 mg/min | INTRAVENOUS | Status: DC
  Administered 2012-01-01: 0.5 mg/min via INTRAVENOUS
  Filled 2011-12-31 (×7): qty 200

## 2011-12-31 MED ORDER — MIDAZOLAM HCL 10 MG/2ML IJ SOLN
INTRAMUSCULAR | Status: DC | PRN
  Administered 2011-12-31: 1 mg via INTRAVENOUS
  Administered 2011-12-31: 2 mg via INTRAVENOUS

## 2011-12-31 MED ORDER — AMIODARONE LOAD VIA INFUSION
150.0000 mg | Freq: Once | INTRAVENOUS | Status: AC
  Administered 2011-12-31: 150 mg via INTRAVENOUS
  Filled 2011-12-31: qty 83.34

## 2011-12-31 MED ORDER — LIDOCAINE VISCOUS 2 % MT SOLN
OROMUCOSAL | Status: AC
  Filled 2011-12-31: qty 15

## 2011-12-31 NOTE — Anesthesia Postprocedure Evaluation (Signed)
  Anesthesia Post-op Note  Patient: Patty [name]  Procedure(s) Performed: Procedure(s) (LRB) with comments: TRANSESOPHAGEAL ECHOCARDIOGRAM (TEE) (N/A) CARDIOVERSION (N/A)  Patient Location: Endoscopy Unit  Anesthesia Type:General  Level of Consciousness: awake, alert  and oriented  Airway and Oxygen Therapy: Patient Spontanous Breathing and Patient connected to nasal cannula oxygen  Post-op Pain: none  Post-op Assessment: Post-op Vital signs reviewed, Patient's Cardiovascular Status Stable, Respiratory Function Stable, Patent Airway and No signs of Nausea or vomiting  Post-op Vital Signs: Reviewed and stable  Complications: No apparent anesthesia complications

## 2011-12-31 NOTE — Progress Notes (Signed)
Echocardiogram Echocardiogram Transesophageal has been performed.  Patty [name] 12/31/2011, 12:49 PM

## 2011-12-31 NOTE — Preoperative (Signed)
Beta Blockers   Reason not to administer Beta Blockers:Not Applicable 

## 2011-12-31 NOTE — Transfer of Care (Signed)
Immediate Anesthesia Transfer of Care Note  Patient: Patty [name]  Procedure(s) Performed: Procedure(s) (LRB) with comments: TRANSESOPHAGEAL ECHOCARDIOGRAM (TEE) (N/A) CARDIOVERSION (N/A)  Patient Location: Endoscopy Unit  Anesthesia Type:General  Level of Consciousness: awake, alert  and oriented  Airway & Oxygen Therapy: Patient Spontanous Breathing and Patient connected to nasal cannula oxygen  Post-op Assessment: Report given to PACU RN, Post -op Vital signs reviewed and stable and Patient moving all extremities  Post vital signs: Reviewed and stable  Complications: No apparent anesthesia complications

## 2011-12-31 NOTE — Progress Notes (Signed)
  Amiodarone Drug - Drug Interaction Consult Note  Recommendations: Pharmacy is follow for warfarin dosing. Will closely monitor INR now that amiodarone is being initiated. No DI noted with atorvastatin.  Amiodarone is metabolized by the cytochrome P450 system and therefore has the potential to cause many drug interactions. Amiodarone has an average plasma half-life of 50 days (range 20 to 100 days).   There is potential for drug interactions to occur several weeks or months after stopping treatment and the onset of drug interactions may be slow after initiating amiodarone.   [x]  Statins: Increased risk of myopathy. Simvastatin- restrict dose to 20mg  daily. Other statins: counsel patients to report any muscle pain or weakness immediately.  [x]  Anticoagulants: Amiodarone can increase anticoagulant effect. Consider warfarin dose reduction. Patients should be monitored closely and the dose of anticoagulant altered accordingly, remembering that amiodarone levels take several weeks to stabilize.  []  Antiepileptics: Amiodarone can increase plasma concentration of phenytoin, phenytoin dose should be reduced. Note that small changes in phenytoin dose can result in large changes in phenytoin levels. Monitor patient closely and counsel on signs of toxicity.  [x]  Beta blockers: increased risk of bradycardia, AV block and myocardial depression. Sotalol - avoid concomitant use.  []   Calcium channel blockers (diltiazem and verapamil): increased risk of bradycardia, AV block and myocardial depression.  []   Cyclosporine: Amiodarone increases levels of cyclosporine. Reduced dose of cyclosporine is recommended.  []  Digoxin dose should be halved when amiodarone is started.  []  Diuretics: increased risk of cardiotoxicity if hypokalemia occurs.  []  Oral hypoglycemic agents (glyburide, glipizide, glimepiride): increased risk of hypoglycemia. Patient's glucose levels should be monitored closely when initiating  amiodarone therapy.   []  Drugs that prolong the QT interval: Concurrent therapy is contraindicated due to the increased risk of torsades de pointes; . Antibiotics: e.g. fluoroquinolones, erythromycin. . Antiarrhythmics: e.g. quinidine, procainamide, disopyramide, sotalol. . Antipsychotics: e.g. phenothiazines, haloperidol.  . Lithium, tricyclic antidepressants, and methadone. Thank You,  Severiano Gilbert  12/31/2011 2:10 PM

## 2011-12-31 NOTE — Progress Notes (Signed)
ANTICOAGULATION CONSULT NOTE  Pharmacy Consult for coumadin Indication: atrial fibrillation  Allergies  Allergen Reactions  . Bee Venom Anaphylaxis  . Other     Pain medications cause severe vomiting.    Patient Measurements: Height: 5' (152.4 cm) Weight: 174 lb 9.7 oz (79.2 kg) (scale b) IBW/kg (Calculated) : 45.5   Vital Signs: Temp: 98.1 F (36.7 C) (11/27 0551) Temp src: Oral (11/27 0551) BP: 110/88 mmHg (11/27 0551) Pulse Rate: 113  (11/27 0551)  Labs:  Alvira Philips 12/31/11 0440 12/30/11 0615 12/29/11 0609 12/28/11 2151 12/28/11 1549  HGB -- -- -- -- --  HCT -- -- -- -- --  PLT -- -- -- -- --  APTT -- -- -- -- --  LABPROT 27.5* 27.7* 24.6* -- --  INR 2.72* 2.75* 2.34* -- --  HEPARINUNFRC -- -- -- -- --  CREATININE 0.89 0.95 0.90 -- --  CKTOTAL -- -- -- -- --  CKMB -- -- -- -- --  TROPONINI -- -- 0.38* 0.65* 0.54*    Estimated Creatinine Clearance: 47.7 ml/min (by C-G formula based on Cr of 0.89).    Assessment: 78 y/o female patient recently started on coumadin for afib.  Admit INR subtherapeutic at 1.81.  Home dose reported as 2 mg daily. Today INR is therapeutic and stabile. upward trend. No bleeding reported.   Goal of Therapy:  INR 2-3  Plan:  Coumadin 2mg  today and f/u daily protime.  Verlene Mayer, PharmD, BCPS Pager [phone] 12/31/2011 9:13 AM

## 2011-12-31 NOTE — Progress Notes (Signed)
SUBJECTIVE:  Feels better.  Diuresed well.  SHOB significantly better.   Improved shortness of breath with walking to the bathroom.  OBJECTIVE:   Vitals:   Filed Vitals:   12/30/11 1341 12/30/11 2138 12/31/11 0551 12/31/11 0922  BP: 108/50 123/63 110/88 148/80  Pulse: 93 96 113   Temp: 98.4 F (36.9 C)  98.1 F (36.7 C) 97.9 F (36.6 C)  TempSrc: Oral  Oral Oral  Resp: 19 18 20 18   Height:      Weight:   79.2 kg (174 lb 9.7 oz)   SpO2: 94% 93% 96% 95%   I&O's:    Intake/Output Summary (Last 24 hours) at 12/31/11 8295 Last data filed at 12/31/11 0817  Gross per 24 hour  Intake    120 ml  Output   1050 ml  Net   -930 ml   TELEMETRY: Reviewed telemetry pt in AFib with variable rate control.:     PHYSICAL EXAM General: Well developed, well nourished, in no acute distress Head:   Normal cephalic and atramatic  Lungs:   Clear bilaterally to auscultation and percussion. Heart:  Irregularly irregular, intermittent rate control Abdomen:  abdomen soft and non-tender Msk:  Back normal, normal gait. Normal strength and tone for age. Extremities: No clubbing, cyanosis or edema.  DP +1 Neuro: Alert and oriented X 3. Psych:  Normal affect, responds appropriately   LABS: Basic Metabolic Panel:  Basename 12/31/11 0440 12/30/11 0615  NA 143 142  K 3.6 3.6  CL 104 104  CO2 30 31  GLUCOSE 87 103*  BUN 23 21  CREATININE 0.89 0.95  CALCIUM 8.7 8.5  MG -- --  PHOS -- --   Liver Function Tests:  [facility] 12/28/11 1549  AST 141*  ALT 152*  ALKPHOS 166*  BILITOT 0.7  PROT 6.8  ALBUMIN 3.4*   No results found for this basename: LIPASE:2,AMYLASE:2 in the last 72 hours CBC: No results found for this basename: WBC:2,NEUTROABS:2,HGB:2,HCT:2,MCV:2,PLT:2 in the last 72 hours Cardiac Enzymes:  Basename 12/29/11 0609 12/28/11 2151 12/28/11 1549  CKTOTAL -- -- --  CKMB -- -- --  CKMBINDEX -- -- --  TROPONINI 0.38* 0.65* 0.54*   BNP: No components found with this  basename: POCBNP:3 D-Dimer: No results found for this basename: DDIMER:2 in the last 72 hours Hemoglobin A1C: No results found for this basename: HGBA1C in the last 72 hours Fasting Lipid Panel: No results found for this basename: CHOL,HDL,LDLCALC,TRIG,CHOLHDL,LDLDIRECT in the last 72 hours Thyroid Function Tests:  Basename 12/28/11 1549  TSH 3.050  T4TOTAL --  T3FREE --  THYROIDAB --   Anemia Panel: No results found for this basename: VITAMINB12,FOLATE,FERRITIN,TIBC,IRON,RETICCTPCT in the last 72 hours Coag Panel:   Lab Results  Component Value Date   INR 2.72* 12/31/2011   INR 2.75* 12/30/2011   INR 2.34* 12/29/2011    RADIOLOGY: Ct Angio Chest Pe W/cm &/or Wo Cm  12/28/2011  *RADIOLOGY REPORT*  Clinical Data: Shortness of breath and atrial fibrillation.  CT ANGIOGRAPHY CHEST  Technique:  Multidetector CT imaging of the chest using the standard protocol during bolus administration of intravenous contrast. Multiplanar reconstructed images including MIPs were obtained and reviewed to evaluate the vascular anatomy.  Contrast: OMNIPAQUE IOHEXOL 350 MG/ML SOLN  Comparison: 06/09/2008  Findings: The pulmonary arteries are adequately opacified.  There is no evidence of pulmonary embolism.  Lungs show evidence of congestive heart failure with moderate venous congestion and early airspace edema present in both lower lung zones.  There  also are small bilateral pleural effusions.  The heart is moderately enlarged and prominent calcified atherosclerotic plaque is noted in the distribution of the LAD and left circumflex coronary arteries.  There is stable mild dilatation of the ascending thoracic aorta measuring 4.3 cm in greatest diameter.  The aorta is heavily calcified.  No pericardial effusion.  No pulmonary masses or enlarged lymph nodes are identified.  Bony structures are unremarkable.  Visualized upper abdomen shows reflux of contrast into the IVC and all three hepatic veins.  Findings  are consistent with right heart failure.  There is a small hiatal hernia.  IMPRESSION:  1.  No evidence of acute pulmonary embolism. 2.  Congestive heart failure with moderate pulmonary edema present as well small bilateral pleural effusions.  Associated cardiomegaly and coronary atherosclerosis identified.  There is evidence of right heart failure. 3.  Stable aneurysmal disease of the ascending thoracic aorta measuring 4.3 cm in greatest diameter.   Original Report Authenticated By: Irish Lack, M.D.    Dg Chest Portable 1 View  12/28/2011  *RADIOLOGY REPORT*  Clinical Data: Shortness of breath, congestion, chest pain  PORTABLE CHEST - 1 VIEW  Comparison: 09/22/2005  Findings: Cardiomegaly noted with increased diffuse symmetric interstitial changes compatible with edema.  Mild CHF is favored. Minimal basilar atelectasis.  No large effusion or pneumothorax. Atherosclerosis of the aorta.  IMPRESSION: Mild CHF pattern   Original Report Authenticated By: Judie Petit. Shick, M.D.       ASSESSMENT: AFib with intermittent RVR.  Diastolic dysfunction.  Heart rate has not been well controlled.  Blood pressure limits amount of rate control medication that can be given. It has been almost 12 hours since she got her last dose of diltiazem and her heart rate is now in the 120 range.  PLAN:  Continue diuresis as tolerated.  Rate control with cardizem.  Will switch to PO sustained release.  Replace potassium.  Plan for TEE/cardioversion today.  She has been on Coumadin for only a week.      CAD has been medically managed most recently.  Mildly increased troponin in the setting of fluid overload and increased heart rate.  Would not be too aggressive to cath unless sx return.  Known circumflex disease.  Coumadin for stroke prevention.  Consider discharge later today depending on how she does with cardioversion.  She is symptomatic with her atrial fibrillation.  Would have to consider adding antiarrhythmic drug if atrial  fibrillation recurs.  Amiodarone would be reasonable in her case.    Corky Crafts., MD  12/31/2011  9:39 AM

## 2011-12-31 NOTE — Transfer of Care (Signed)
Immediate Anesthesia Transfer of Care Note  Patient: Patty [name]  Procedure(s) Performed: Procedure(s) (LRB) with comments: CARDIOVERSION (N/A)  Patient Location: Cath Lab  Anesthesia Type:General  Level of Consciousness: awake, alert  and oriented  Airway & Oxygen Therapy: Patient Spontanous Breathing and Patient connected to nasal cannula oxygen  Post-op Assessment: Report given to PACU RN, Post -op Vital signs reviewed and stable and Patient moving all extremities  Post vital signs: Reviewed and stable  Complications: No apparent anesthesia complications

## 2011-12-31 NOTE — Progress Notes (Signed)
Page MD. [phone]. Informed MD Pt back in afib after cardoversn. Amiadone started Pt HR down from 120-140s to 90-100. Will continue to monitor.

## 2011-12-31 NOTE — Anesthesia Preprocedure Evaluation (Signed)
Anesthesia Evaluation  Patient identified by MRN, date of birth, ID band Patient awake    Reviewed: Allergy & Precautions, H&P , NPO status , Patient's Chart, lab work & pertinent test results, reviewed documented beta blocker date and time   Airway Mallampati: II TM Distance: >3 FB Neck ROM: full    Dental   Pulmonary shortness of breath and with exertion,  breath sounds clear to auscultation        Cardiovascular hypertension, On Medications and On Home Beta Blockers + CAD, + Past MI and +CHF + dysrhythmias Atrial Fibrillation Rhythm:regular     Neuro/Psych negative neurological ROS  negative psych ROS   GI/Hepatic negative GI ROS, Neg liver ROS,   Endo/Other  negative endocrine ROS  Renal/GU Renal disease  negative genitourinary   Musculoskeletal   Abdominal   Peds  Hematology negative hematology ROS (+)   Anesthesia Other Findings See surgeon's H&P   Reproductive/Obstetrics negative OB ROS                           Anesthesia Physical Anesthesia Plan  ASA: II  Anesthesia Plan: General   Post-op Pain Management:    Induction: Intravenous  Airway Management Planned: Mask  Additional Equipment:   Intra-op Plan:   Post-operative Plan: Extubation in OR  Informed Consent: I have reviewed the patients History and Physical, chart, labs and discussed the procedure including the risks, benefits and alternatives for the proposed anesthesia with the patient or authorized representative who has indicated his/her understanding and acceptance.   Dental Advisory Given  Plan Discussed with: CRNA and Surgeon  Anesthesia Plan Comments:         Anesthesia Quick Evaluation

## 2011-12-31 NOTE — H&P (Signed)
  Date of Initial H&P: 12/28/11  History reviewed, patient examined, no change in status, stable for surgery.  Unsuccessful cardioversion with single defibrillator.  Will try again with option of 2 simultaneous defibrillators.  Explained to patient and husband.

## 2011-12-31 NOTE — Anesthesia Preprocedure Evaluation (Signed)
Anesthesia Evaluation  Patient identified by MRN, date of birth, ID band Patient awake    Reviewed: Allergy & Precautions, H&P , NPO status , Patient's Chart, lab work & pertinent test results, reviewed documented beta blocker date and time   Airway Mallampati: II TM Distance: >3 FB Neck ROM: full    Dental   Pulmonary shortness of breath and with exertion,  breath sounds clear to auscultation        Cardiovascular hypertension, On Medications and On Home Beta Blockers + CAD and + Past MI + dysrhythmias Atrial Fibrillation Rhythm:regular     Neuro/Psych negative neurological ROS  negative psych ROS   GI/Hepatic negative GI ROS, Neg liver ROS,   Endo/Other  negative endocrine ROS  Renal/GU Renal disease  negative genitourinary   Musculoskeletal   Abdominal   Peds  Hematology negative hematology ROS (+)   Anesthesia Other Findings See surgeon's H&P   Reproductive/Obstetrics negative OB ROS                           Anesthesia Physical Anesthesia Plan  ASA: III  Anesthesia Plan: General   Post-op Pain Management:    Induction: Intravenous  Airway Management Planned: Mask  Additional Equipment:   Intra-op Plan:   Post-operative Plan:   Informed Consent: I have reviewed the patients History and Physical, chart, labs and discussed the procedure including the risks, benefits and alternatives for the proposed anesthesia with the patient or authorized representative who has indicated his/her understanding and acceptance.   Dental Advisory Given  Plan Discussed with: CRNA and Surgeon  Anesthesia Plan Comments:         Anesthesia Quick Evaluation

## 2011-12-31 NOTE — CV Procedure (Signed)
Electrical Cardioversion Procedure Note JOLLENE RAWL [id number] Aug 23, 1930  Procedure: Electrical Cardioversion Indications:  Atrial flutter  Time Out: Verified patient identification, verified procedure,medications/allergies/relevent history reviewed, required imaging and test results available.  Performed  Procedure Details  The patient was NPO after midnight. Anesthesia was administered at the beside  by Dr.Frederick with 80mg  of propofol.  Cardioversion was done with synchronized  defibrillation with AP pads with 360 J.  Two defibrillators were used simultaneously at 360J each.  An attempt was made and there were several brief episodes of clear NSR, but the tachycardia returned. The patient tolerated the procedure well   IMPRESSION:  Attempted cardioversion of atrial flutter with temporary conversion to NSR.  Will start IV amiodarone to see if this will help the patient maintain NSR.    VARANASI,JAYADEEP S. 12/31/2011, 1:31 PM

## 2011-12-31 NOTE — Procedures (Signed)
Electrical Cardioversion Procedure Note Patty [name] [id number] [birth year]  Procedure: Electrical Cardioversion Indications:  Atrial Fibrillation  Procedure Details Consent: Risks of procedure as well as the alternatives and risks of each were explained to the (patient/caregiver).  Consent for procedure obtained. Time Out: Verified patient identification, verified procedure, site/side was marked, verified correct patient position, special equipment/implants available, medications/allergies/relevent history reviewed, required imaging and test results available.  Performed  Patient placed on cardiac monitor, pulse oximetry, supplemental oxygen as necessary.  Sedation given: propofol 80 mg  Pacer pads placed anterior and posterior chest.  Cardioverted 4 time(s).  Cardioverted at 120J, 150J, 200J, 200J.  Evaluation Findings: Post procedure EKG shows: atrial tacycardia vs. sinus tachycardia with BBB Complications: None Patient did tolerate procedure well.  Continue Coumadin.  Patient may need an antiarrhythmic drug in the future if this does not turn out to be NSR.   Patty [name] S. 12/31/2011, 10:37 AM

## 2011-12-31 NOTE — Anesthesia Postprocedure Evaluation (Signed)
  Anesthesia Post-op Note  Patient: Patty [name]  Procedure(s) Performed: Procedure(s) (LRB) with comments: CARDIOVERSION (N/A)  Patient Location: Cath Lab  Anesthesia Type:General  Level of Consciousness: awake, alert  and oriented  Airway and Oxygen Therapy: Patient Spontanous Breathing and Patient connected to nasal cannula oxygen  Post-op Pain: none  Post-op Assessment: Post-op Vital signs reviewed, Patient's Cardiovascular Status Stable, Respiratory Function Stable, Patent Airway and No signs of Nausea or vomiting  Post-op Vital Signs: Reviewed and stable  Complications: No apparent anesthesia complications

## 2011-12-31 NOTE — CV Procedure (Signed)
TEE performed without complications.  Normal LVEF.  Mild MR. Tr AI. Aortic sclerosis.  No LA/LAA thrombus.  Severe atherosclerosis in the descending aorta.  Will plan for cardioversion.

## 2012-01-01 LAB — BASIC METABOLIC PANEL WITH GFR
BUN: 25 mg/dL — ABNORMAL HIGH (ref 6–23)
CO2: 30 meq/L (ref 19–32)
Calcium: 8.9 mg/dL (ref 8.4–10.5)
Chloride: 103 meq/L (ref 96–112)
Creatinine, Ser: 0.96 mg/dL (ref 0.50–1.10)
GFR calc Af Amer: 64 mL/min — ABNORMAL LOW
GFR calc non Af Amer: 55 mL/min — ABNORMAL LOW
Glucose, Bld: 126 mg/dL — ABNORMAL HIGH (ref 70–99)
Potassium: 3.2 meq/L — ABNORMAL LOW (ref 3.5–5.1)
Sodium: 142 meq/L (ref 135–145)

## 2012-01-01 LAB — PROTIME-INR
INR: 2.98 — ABNORMAL HIGH (ref 0.00–1.49)
Prothrombin Time: 29.4 seconds — ABNORMAL HIGH (ref 11.6–15.2)

## 2012-01-01 MED ORDER — WARFARIN 0.5 MG HALF TABLET
0.5000 mg | ORAL_TABLET | Freq: Once | ORAL | Status: AC
  Administered 2012-01-01: 0.5 mg via ORAL
  Filled 2012-01-01: qty 1

## 2012-01-01 MED ORDER — DIPHENHYDRAMINE HCL 25 MG PO CAPS
25.0000 mg | ORAL_CAPSULE | Freq: Four times a day (QID) | ORAL | Status: DC | PRN
  Administered 2012-01-01: 25 mg via ORAL
  Filled 2012-01-01: qty 1

## 2012-01-01 MED ORDER — METOPROLOL TARTRATE 50 MG PO TABS
75.0000 mg | ORAL_TABLET | Freq: Two times a day (BID) | ORAL | Status: DC
  Administered 2012-01-01 – 2012-01-02 (×3): 75 mg via ORAL
  Filled 2012-01-01 (×4): qty 1

## 2012-01-01 MED ORDER — HYDROCORTISONE 1 % EX CREA
TOPICAL_CREAM | Freq: Three times a day (TID) | CUTANEOUS | Status: DC
  Administered 2012-01-01 (×2): via TOPICAL
  Filled 2012-01-01: qty 28

## 2012-01-01 MED ORDER — AMIODARONE HCL 200 MG PO TABS
400.0000 mg | ORAL_TABLET | Freq: Two times a day (BID) | ORAL | Status: DC
  Administered 2012-01-01 – 2012-01-02 (×3): 400 mg via ORAL
  Filled 2012-01-01 (×4): qty 2

## 2012-01-01 NOTE — Progress Notes (Signed)
Subjective:  Overall feels better but she did have a rough night secondary to itching where pads were placed for cardioversion. Cortisone has been administered.  Objective:  Vital Signs in the last 24 hours: Temp:  [97.8 F (36.6 C)-98 F (36.7 C)] 97.8 F (36.6 C) (11/28 0550) Pulse Rate:  [108-118] 108  (11/28 0550) Resp:  [8-21] 20  (11/28 0550) BP: (111-172)/(73-108) 111/88 mmHg (11/28 0550) SpO2:  [92 %-98 %] 96 % (11/28 0550) Weight:  [79.1 kg (174 lb 6.1 oz)] 79.1 kg (174 lb 6.1 oz) (11/28 0550)  Intake/Output from previous day: 11/27 0701 - 11/28 0700 In: 1280 [P.O.:480; I.V.:800] Out: 1500 [Urine:1500]   Physical Exam: General: Well developed, well nourished, in no acute distress. Head:  Normocephalic and atraumatic. Lungs: Clear to auscultation and percussion. Heart: Irregularly irregular, tachycardic  No murmur, rubs or gallops.  Abdomen: soft, non-tender, positive bowel sounds. Extremities: No clubbing or cyanosis. No edema. Neurologic: Alert and oriented x 3.    Lab Results: No results found for this basename: WBC:2,HGB:2,PLT:2 in the last 72 hours  Basename 01/01/12 0435 12/31/11 0440  NA 142 143  K 3.2* 3.6  CL 103 104  CO2 30 30  GLUCOSE 126* 87  BUN 25* 23  CREATININE 0.96 0.89    Telemetry: Atrial fibrillation heart rate mostly in the 120s to 130s currently. Personally viewed.   Assessment/Plan:  Active Problems:  Acute diastolic CHF (congestive heart failure)  Atrial fibrillation with RVR  1. Atrial fibrillation-unsuccessful attempts at cardioversion yesterday. Plan is to load with IV amiodarone/PL then if cardioversion is not take place chemically then to reattempt electrical cardioversion after a few weeks of stabilization. Currently she does not appear to be in any distress with her age fibrillation however she still remains poorly controlled from a heart rate perspective. I will continue with the recently placed IV bag of amiodarone and once  that is completed, will transition to 400 mg twice a day by mouth. I will increase her metoprolol to 75 mg by mouth twice a day. I would like to monitor her today.  2. Chronic anticoagulation-Coumadin. Therapeutic.  3. Skin irritation-continue hydrocortisone cream.  4. Acute diastolic heart failure-I will continue with IV furosemide. Creatinine is stable. BUN is slightly elevated today at 25 from 23. Likely tomorrow will change to by mouth Lasix. Continue to monitor.  5. Hypokalemia-replete potassium.  6. Hypertension-continue current medications which include ACE inhibitor.  She is comfortable staying in hospital today. Maisie Fus, her husband, is a patient of mine.   SKAINS, MARK 01/01/2012, 10:01 AM

## 2012-01-01 NOTE — Progress Notes (Signed)
Pt noted complaining of back itching. In to evaluate and noted that pt had bright red area to back and center chest that appears to be the shape of the pads from the cardioversion. Washed area and lotion applied x2. Dr. Shirlee Latch notified and new order given for hydrocortisone cream and PRN benadryl.

## 2012-01-01 NOTE — Progress Notes (Signed)
ANTICOAGULATION CONSULT NOTE  Pharmacy Consult for coumadin Indication: atrial fibrillation  Vital Signs: Temp: 97.8 F (36.6 C) (11/28 0550) Temp src: Oral (11/28 0550) BP: 111/88 mmHg (11/28 0550) Pulse Rate: 108  (11/28 0550)  Labs:  Basename 01/01/12 0435 12/31/11 0440 12/30/11 0615  HGB -- -- --  HCT -- -- --  PLT -- -- --  APTT -- -- --  LABPROT 29.4* 27.5* 27.7*  INR 2.98* 2.72* 2.75*  HEPARINUNFRC -- -- --  CREATININE 0.96 0.89 0.95  CKTOTAL -- -- --  CKMB -- -- --  TROPONINI -- -- --    Estimated Creatinine Clearance: 44.2 ml/min (by C-G formula based on Cr of 0.96).  Assessment: 78 y/o female patient recently started on coumadin for afib.  Admit INR subtherapeutic at 1.81.  Home dose reported as 2 mg daily.   Today INR is therapeutic at upper end of goal. No bleeding reported. Patient s/p cardioversion yesterday, converted back into afib that afternoon, amiodarone IV started. Will decrease warfarin tonight, if amio continues patient may need to cut warfarin back and alternate 1mg  and 2mg  doses.   Goal of Therapy:  INR 2-3  Plan:  Coumadin 0.5 mg today and f/u daily protime.  Sheppard Coil, PharmD, BCPS Pager [phone] 01/01/2012 7:38 AM

## 2012-01-02 LAB — BASIC METABOLIC PANEL
CO2: 31 mEq/L (ref 19–32)
Chloride: 103 mEq/L (ref 96–112)
Creatinine, Ser: 1.03 mg/dL (ref 0.50–1.10)
GFR calc Af Amer: 58 mL/min — ABNORMAL LOW (ref >90)
Potassium: 3.6 mEq/L (ref 3.5–5.1)
Sodium: 143 mEq/L (ref 135–145)

## 2012-01-02 LAB — PROTIME-INR
INR: 2.84 — ABNORMAL HIGH (ref 0.00–1.49)
Prothrombin Time: 28.4 seconds — ABNORMAL HIGH (ref 11.6–15.2)

## 2012-01-02 MED ORDER — ATORVASTATIN CALCIUM 80 MG PO TABS: 80.0000 mg | ORAL_TABLET | Freq: Every day | ORAL | Status: DC

## 2012-01-02 MED ORDER — AMIODARONE HCL 400 MG PO TABS: 400.0000 mg | ORAL_TABLET | Freq: Two times a day (BID) | ORAL | Status: DC

## 2012-01-02 MED ORDER — FUROSEMIDE 40 MG PO TABS
40.0000 mg | ORAL_TABLET | Freq: Every day | ORAL | Status: DC
  Filled 2012-01-02: qty 1

## 2012-01-02 MED ORDER — DILTIAZEM HCL ER COATED BEADS 240 MG PO CP24: 240.0000 mg | ORAL_CAPSULE | Freq: Every day | ORAL | Status: DC

## 2012-01-02 MED ORDER — HYDROCORTISONE 1 % EX CREA: TOPICAL_CREAM | Freq: Three times a day (TID) | CUTANEOUS | Status: DC

## 2012-01-02 MED ORDER — POTASSIUM CHLORIDE CRYS ER 20 MEQ PO TBCR
20.0000 meq | EXTENDED_RELEASE_TABLET | Freq: Every day | ORAL | Status: DC
  Administered 2012-01-02: 20 meq via ORAL

## 2012-01-02 MED ORDER — POTASSIUM CHLORIDE CRYS ER 20 MEQ PO TBCR: 20.0000 meq | EXTENDED_RELEASE_TABLET | Freq: Every day | ORAL | Status: DC

## 2012-01-02 MED ORDER — FUROSEMIDE 40 MG PO TABS: 40.0000 mg | ORAL_TABLET | Freq: Every day | ORAL | Status: DC

## 2012-01-02 MED ORDER — METOPROLOL TARTRATE 50 MG PO TABS: 75.0000 mg | ORAL_TABLET | Freq: Two times a day (BID) | ORAL | Status: DC

## 2012-01-02 NOTE — Progress Notes (Signed)
Pt d/c to home with husband.D/c instructions and medications reviewed with Pt. Pt states understand. All Pt questions answered.

## 2012-01-02 NOTE — Discharge Summary (Signed)
Patient ID: Patty [name] MRN: [id number] DOB/AGE: 03/30/1930 78 y.o.  Admit date: 12/28/2011 Discharge date: 01/02/2012  Primary Discharge Diagnosis: Atrial fibrillation with rapid ventricular response  Secondary Discharge Diagnosis: Coronary artery disease, acute diastolic heart failure, hyperlipidemia, hypertension, anticoagulation  Significant Diagnostic Studies:  ECHO: - Left ventricle: The cavity size was normal. There was mild focal basal hypertrophy of the septum. Systolic function was normal. The estimated ejection fraction was in the range of 60% to 65%. Wall motion was normal; there were no regional wall motion abnormalities. - Mitral valve: Mild regurgitation. - Left atrium: The atrium was mildly dilated. - Right atrium: The atrium was mildly dilated.    Hospital Course: 78 year old with a history of coronary artery disease, remote myocardial infarction with failed intervention to obtuse marginal 2009 on medical management, prior stent to LAD and circumflex who was admitted with chest pain, shortness of breath and atrial fibrillation with rapid ventricular response. Originally, she was feeling short of breath when walking to the bathroom. Her troponin was mildly elevated peaking at 0.65. CT of the chest showed no evidence of pulmonary embolism but showed moderate pulmonary edema. Thoracic aorta measured 4.3 cm.  Because of her ongoing atrial fibrillation, cardioversion was attempted, 4 attempts. Dr. Berton Mount was then consulted and recommended taking her to the electrophysiology lab for further attempt were 2 defibrillators were placed and synchronized. Following each cardioversion attempt she showed brief periods of normal sinus rhythm but ultimately reverted back to atrial fibrillation. She was started on IV amiodarone. The next day, she remained in rapid atrial fibrillation with heart rates of 120 to 130 and her metoprolol was increased from 50 to 75 mg twice a day and  she was continued on diltiazem 240 mg long-acting once a day. After her IV bag was completed, by mouth amiodarone 400 mg twice a day was begun. In mid afternoon yesterday, her heart rate decreased into the 60s to 80s and she states that she feels herself. She remains in atrial fibrillation however. Much better rate control.  She was also administered IV diuresis throughout the hospitalization, and -2.6 L were removed.  I have placed her on Lasix 40 mg once a day with potassium 20 mEq once a day on discharge. I have discontinued her amlodipine and continued her diltiazem. Metoprolol was increased as above. She will continue with amiodarone load for 7 days. I've instructed her to make an appointment for next week. She will also need to have her PT/INR checked given her recent initiation of amiodarone. INR may increase.  If she remains in atrial fibrillation over the next 3 weeks, reattempt cardioversion may take place.  She is ambulating well, feeling well. Eager to go home.   Discharge Exam: Blood pressure 122/84, pulse 85, temperature 97.1 F (36.2 C), temperature source Oral, resp. rate 18, height 5' (1.524 m), weight 78.608 kg (173 lb 4.8 oz), SpO2 98.00%.    General: Alert and oriented x3 no acute distress Cardiovascular: Irregularly irregular rhythm, soft systolic murmur left lower sternal border, no JVD Lungs: Clear to auscultation bilaterally Abdomen: Soft, positive bowel sounds, overweight Extremities: No edema  Labs:   Lab Results  Component Value Date   WBC 7.8 12/28/2011   HGB 13.2 12/28/2011   HCT 39.6 12/28/2011   MCV 88.2 12/28/2011   PLT 233 12/28/2011    Lab 01/02/12 0500 12/28/11 1549  NA 143 --  K 3.6 --  CL 103 --  CO2 31 --  BUN 29* --  CREATININE 1.03 --  CALCIUM 8.8 --  PROT -- 6.8  BILITOT -- 0.7  ALKPHOS -- 166*  ALT -- 152*  AST -- 141*  GLUCOSE 106* --   Lab Results  Component Value Date   CKTOTAL 70 06/08/2010   CKMB 2.6 06/08/2010   TROPONINI  0.38* 12/29/2011    Lab Results  Component Value Date   CHOL 134 10/03/2010   CHOL 111 06/08/2010   CHOL  Value: 121        ATP III CLASSIFICATION:  <200     mg/dL   Desirable  981-191  mg/dL   Borderline High  >=478    mg/dL   High        2/95/6213   Lab Results  Component Value Date   HDL 62.80 10/03/2010   HDL 53 06/08/2010   HDL 56 07/19/2009   Lab Results  Component Value Date   LDLCALC 58 10/03/2010   LDLCALC  Value: 49        Total Cholesterol/HDL:CHD Risk Coronary Heart Disease Risk Table                     Men   Women  1/2 Average Risk   3.4   3.3  Average Risk       5.0   4.4  2 X Average Risk   9.6   7.1  3 X Average Risk  23.4   11.0        Use the calculated Patient Ratio above and the CHD Risk Table to determine the patient's CHD Risk.        ATP III CLASSIFICATION (LDL):  <100     mg/dL   Optimal  086-578  mg/dL   Near or Above                    Optimal  130-159  mg/dL   Borderline  469-629  mg/dL   High  >528     mg/dL   Very High 05/05/3242   LDLCALC  Value: 52        Total Cholesterol/HDL:CHD Risk Coronary Heart Disease Risk Table                     Men   Women  1/2 Average Risk   3.4   3.3  Average Risk       5.0   4.4  2 X Average Risk   9.6   7.1  3 X Average Risk  23.4   11.0        Use the calculated Patient Ratio above and the CHD Risk Table to determine the patient's CHD Risk.        ATP III CLASSIFICATION (LDL):  <100     mg/dL   Optimal  010-272  mg/dL   Near or Above                    Optimal  130-159  mg/dL   Borderline  536-644  mg/dL   High  >034     mg/dL   Very High 7/42/5956   Lab Results  Component Value Date   TRIG 66.0 10/03/2010   TRIG 43 06/08/2010   TRIG 63 07/19/2009   Lab Results  Component Value Date   CHOLHDL 2 10/03/2010   CHOLHDL 2.1 06/08/2010   CHOLHDL 2.2 07/19/2009   No results found for this basename: LDLDIRECT     BNP was  1212 on admission. TSH was 3.0. Creatinine on discharge was 1.03. INR was 2.8.   FOLLOW UP PLANS AND  APPOINTMENTS Discharge Orders    Future Appointments: Provider: Department: Dept Phone: Center:   05/26/2012 11:00 AM [facility] MEDICAL ONCOLOGY [phone] None   05/26/2012 11:30 AM Reece Packer, MD [facility] MEDICAL ONCOLOGY [phone] None     Future Orders Please Complete By Expires   Diet - low sodium heart healthy      Increase activity slowly          Medication List     As of 01/02/2012  8:24 AM    STOP taking these medications         amLODipine-atorvastatin 10-80 MG per tablet   Commonly known as: CADUET      TAKE these medications         acetaminophen 500 MG tablet   Commonly known as: TYLENOL   Take 500 mg by mouth every 6 (six) hours as needed. For pain      amiodarone 400 MG tablet   Commonly known as: PACERONE   Take 1 tablet (400 mg total) by mouth 2 (two) times daily.      aspirin EC 81 MG tablet   Take 81 mg by mouth daily.      atorvastatin 80 MG tablet   Commonly known as: LIPITOR   Take 1 tablet (80 mg total) by mouth daily.      CALTRATE 600 PO   Take 1 tablet by mouth daily.      COQ10 PO   Take 100 mg by mouth daily.      diltiazem 240 MG 24 hr capsule   Commonly known as: CARDIZEM CD   Take 1 capsule (240 mg total) by mouth daily.      furosemide 40 MG tablet   Commonly known as: LASIX   Take 1 tablet (40 mg total) by mouth daily.      hydrochlorothiazide 12.5 MG tablet   Commonly known as: HYDRODIURIL   Take 12.5 mg by mouth daily.      hydrocortisone cream 1 %   Apply topically 3 (three) times daily. May stop once redness improved.      isosorbide mononitrate 60 MG 24 hr tablet   Commonly known as: IMDUR   Take 60 mg by mouth daily.      lisinopril 40 MG tablet   Commonly known as: PRINIVIL,ZESTRIL   Take 40 mg by mouth daily.      metoprolol 50 MG tablet   Commonly known as: LOPRESSOR   Take 1.5 tablets (75 mg total) by mouth 2 (two) times daily.       multivitamin tablet   Take 1 tablet by mouth daily.      nitroGLYCERIN 0.4 MG SL tablet   Commonly known as: NITROSTAT   Place 0.4 mg under the tongue every 5 (five) minutes as needed. For chest pain      potassium chloride SA 20 MEQ tablet   Commonly known as: K-DUR,KLOR-CON   Take 1 tablet (20 mEq total) by mouth daily.      vitamin E 400 UNIT capsule   Generic drug: vitamin E   Take 400 Units by mouth daily.      warfarin 2 MG tablet   Commonly known as: COUMADIN   Take 2 mg by mouth every evening.           Follow-up Information  Follow up with Corky Crafts., MD. Schedule an appointment as soon as possible for a visit in 1 week. Cristopher Peru, NP ok to see. Check BMET at appt. )    Contact information:   [address] [place] [address] [phone]          BRING ALL MEDICATIONS WITH YOU TO FOLLOW UP APPOINTMENTS  Time spent with patient to include physician time: 35 minutes spent with patient instructions, meds reconciliation, review of medical records/data. SignedDonato Schultz 01/02/2012, 8:24 AM

## 2012-01-05 ENCOUNTER — Encounter (HOSPITAL_COMMUNITY): Payer: Self-pay | Admitting: Interventional Cardiology

## 2012-01-05 DIAGNOSIS — I509 Heart failure, unspecified: Secondary | ICD-10-CM | POA: Diagnosis not present

## 2012-01-05 DIAGNOSIS — Z5181 Encounter for therapeutic drug level monitoring: Secondary | ICD-10-CM | POA: Diagnosis not present

## 2012-01-05 DIAGNOSIS — Z7901 Long term (current) use of anticoagulants: Secondary | ICD-10-CM | POA: Diagnosis not present

## 2012-01-05 DIAGNOSIS — I4891 Unspecified atrial fibrillation: Secondary | ICD-10-CM | POA: Diagnosis not present

## 2012-01-05 DIAGNOSIS — I10 Essential (primary) hypertension: Secondary | ICD-10-CM | POA: Diagnosis not present

## 2012-01-05 DIAGNOSIS — R5381 Other malaise: Secondary | ICD-10-CM | POA: Diagnosis not present

## 2012-01-05 DIAGNOSIS — R5383 Other fatigue: Secondary | ICD-10-CM | POA: Diagnosis not present

## 2012-01-07 DIAGNOSIS — I4891 Unspecified atrial fibrillation: Secondary | ICD-10-CM | POA: Diagnosis not present

## 2012-01-07 DIAGNOSIS — Z7901 Long term (current) use of anticoagulants: Secondary | ICD-10-CM | POA: Diagnosis not present

## 2012-01-09 DIAGNOSIS — Z7901 Long term (current) use of anticoagulants: Secondary | ICD-10-CM | POA: Diagnosis not present

## 2012-01-09 DIAGNOSIS — I509 Heart failure, unspecified: Secondary | ICD-10-CM | POA: Diagnosis not present

## 2012-01-09 DIAGNOSIS — Z5181 Encounter for therapeutic drug level monitoring: Secondary | ICD-10-CM | POA: Diagnosis not present

## 2012-01-09 DIAGNOSIS — I4891 Unspecified atrial fibrillation: Secondary | ICD-10-CM | POA: Diagnosis not present

## 2012-01-09 DIAGNOSIS — R5381 Other malaise: Secondary | ICD-10-CM | POA: Diagnosis not present

## 2012-01-09 DIAGNOSIS — I10 Essential (primary) hypertension: Secondary | ICD-10-CM | POA: Diagnosis not present

## 2012-01-13 DIAGNOSIS — Z7901 Long term (current) use of anticoagulants: Secondary | ICD-10-CM | POA: Diagnosis not present

## 2012-01-13 DIAGNOSIS — I4891 Unspecified atrial fibrillation: Secondary | ICD-10-CM | POA: Diagnosis not present

## 2012-01-13 DIAGNOSIS — I2510 Atherosclerotic heart disease of native coronary artery without angina pectoris: Secondary | ICD-10-CM | POA: Diagnosis not present

## 2012-01-13 DIAGNOSIS — I10 Essential (primary) hypertension: Secondary | ICD-10-CM | POA: Diagnosis not present

## 2012-01-14 DIAGNOSIS — Z7901 Long term (current) use of anticoagulants: Secondary | ICD-10-CM | POA: Diagnosis not present

## 2012-01-14 DIAGNOSIS — I10 Essential (primary) hypertension: Secondary | ICD-10-CM | POA: Diagnosis not present

## 2012-01-14 DIAGNOSIS — R5381 Other malaise: Secondary | ICD-10-CM | POA: Diagnosis not present

## 2012-01-14 DIAGNOSIS — R5383 Other fatigue: Secondary | ICD-10-CM | POA: Diagnosis not present

## 2012-01-14 DIAGNOSIS — Z5181 Encounter for therapeutic drug level monitoring: Secondary | ICD-10-CM | POA: Diagnosis not present

## 2012-01-14 DIAGNOSIS — I4891 Unspecified atrial fibrillation: Secondary | ICD-10-CM | POA: Diagnosis not present

## 2012-01-14 DIAGNOSIS — I509 Heart failure, unspecified: Secondary | ICD-10-CM | POA: Diagnosis not present

## 2012-01-16 DIAGNOSIS — I4891 Unspecified atrial fibrillation: Secondary | ICD-10-CM | POA: Diagnosis not present

## 2012-01-16 DIAGNOSIS — Z7901 Long term (current) use of anticoagulants: Secondary | ICD-10-CM | POA: Diagnosis not present

## 2012-01-19 ENCOUNTER — Ambulatory Visit (HOSPITAL_COMMUNITY): Admit: 2012-01-19 | Payer: Self-pay | Admitting: Interventional Cardiology

## 2012-01-19 ENCOUNTER — Encounter (HOSPITAL_COMMUNITY): Payer: Self-pay

## 2012-01-19 DIAGNOSIS — Z79899 Other long term (current) drug therapy: Secondary | ICD-10-CM | POA: Diagnosis not present

## 2012-01-19 DIAGNOSIS — I4891 Unspecified atrial fibrillation: Secondary | ICD-10-CM | POA: Diagnosis not present

## 2012-01-19 DIAGNOSIS — Z7901 Long term (current) use of anticoagulants: Secondary | ICD-10-CM | POA: Diagnosis not present

## 2012-01-19 SURGERY — CARDIOVERSION
Anesthesia: Monitor Anesthesia Care

## 2012-01-26 DIAGNOSIS — I4891 Unspecified atrial fibrillation: Secondary | ICD-10-CM | POA: Diagnosis not present

## 2012-01-26 DIAGNOSIS — Z7901 Long term (current) use of anticoagulants: Secondary | ICD-10-CM | POA: Diagnosis not present

## 2012-02-02 DIAGNOSIS — Z7901 Long term (current) use of anticoagulants: Secondary | ICD-10-CM | POA: Diagnosis not present

## 2012-02-02 DIAGNOSIS — I4891 Unspecified atrial fibrillation: Secondary | ICD-10-CM | POA: Diagnosis not present

## 2012-02-09 DIAGNOSIS — Z7901 Long term (current) use of anticoagulants: Secondary | ICD-10-CM | POA: Diagnosis not present

## 2012-02-09 DIAGNOSIS — I4891 Unspecified atrial fibrillation: Secondary | ICD-10-CM | POA: Diagnosis not present

## 2012-02-16 DIAGNOSIS — H2510 Age-related nuclear cataract, unspecified eye: Secondary | ICD-10-CM | POA: Diagnosis not present

## 2012-02-16 DIAGNOSIS — H35369 Drusen (degenerative) of macula, unspecified eye: Secondary | ICD-10-CM | POA: Diagnosis not present

## 2012-02-16 DIAGNOSIS — H524 Presbyopia: Secondary | ICD-10-CM | POA: Diagnosis not present

## 2012-02-23 DIAGNOSIS — I4891 Unspecified atrial fibrillation: Secondary | ICD-10-CM | POA: Diagnosis not present

## 2012-02-23 DIAGNOSIS — Z7901 Long term (current) use of anticoagulants: Secondary | ICD-10-CM | POA: Diagnosis not present

## 2012-03-10 ENCOUNTER — Encounter (HOSPITAL_COMMUNITY): Payer: Self-pay | Admitting: Emergency Medicine

## 2012-03-10 ENCOUNTER — Inpatient Hospital Stay (HOSPITAL_COMMUNITY)
Admission: EM | Admit: 2012-03-10 | Discharge: 2012-03-17 | DRG: 287 | Disposition: A | Discharge status: Home / Self Care | Payer: Medicare Other | Attending: Interventional Cardiology | Admitting: Interventional Cardiology

## 2012-03-10 ENCOUNTER — Emergency Department (HOSPITAL_COMMUNITY): Payer: Medicare Other

## 2012-03-10 DIAGNOSIS — R0789 Other chest pain: Secondary | ICD-10-CM | POA: Diagnosis not present

## 2012-03-10 DIAGNOSIS — I5030 Unspecified diastolic (congestive) heart failure: Secondary | ICD-10-CM | POA: Diagnosis present

## 2012-03-10 DIAGNOSIS — J9819 Other pulmonary collapse: Secondary | ICD-10-CM | POA: Diagnosis not present

## 2012-03-10 DIAGNOSIS — I10 Essential (primary) hypertension: Secondary | ICD-10-CM | POA: Diagnosis not present

## 2012-03-10 DIAGNOSIS — E785 Hyperlipidemia, unspecified: Secondary | ICD-10-CM | POA: Diagnosis present

## 2012-03-10 DIAGNOSIS — I2510 Atherosclerotic heart disease of native coronary artery without angina pectoris: Secondary | ICD-10-CM

## 2012-03-10 DIAGNOSIS — I498 Other specified cardiac arrhythmias: Secondary | ICD-10-CM | POA: Diagnosis not present

## 2012-03-10 DIAGNOSIS — E669 Obesity, unspecified: Secondary | ICD-10-CM

## 2012-03-10 DIAGNOSIS — Z6834 Body mass index (BMI) 34.0-34.9, adult: Secondary | ICD-10-CM | POA: Diagnosis not present

## 2012-03-10 DIAGNOSIS — Z87891 Personal history of nicotine dependence: Secondary | ICD-10-CM | POA: Diagnosis not present

## 2012-03-10 DIAGNOSIS — R001 Bradycardia, unspecified: Secondary | ICD-10-CM

## 2012-03-10 DIAGNOSIS — I252 Old myocardial infarction: Secondary | ICD-10-CM

## 2012-03-10 DIAGNOSIS — I4891 Unspecified atrial fibrillation: Secondary | ICD-10-CM

## 2012-03-10 DIAGNOSIS — Z9861 Coronary angioplasty status: Secondary | ICD-10-CM | POA: Diagnosis not present

## 2012-03-10 DIAGNOSIS — R0602 Shortness of breath: Secondary | ICD-10-CM | POA: Diagnosis not present

## 2012-03-10 DIAGNOSIS — R6889 Other general symptoms and signs: Secondary | ICD-10-CM | POA: Diagnosis not present

## 2012-03-10 DIAGNOSIS — I495 Sick sinus syndrome: Principal | ICD-10-CM | POA: Diagnosis present

## 2012-03-10 DIAGNOSIS — I509 Heart failure, unspecified: Secondary | ICD-10-CM | POA: Diagnosis not present

## 2012-03-10 DIAGNOSIS — I5031 Acute diastolic (congestive) heart failure: Secondary | ICD-10-CM | POA: Diagnosis not present

## 2012-03-10 DIAGNOSIS — Z7901 Long term (current) use of anticoagulants: Secondary | ICD-10-CM | POA: Diagnosis not present

## 2012-03-10 DIAGNOSIS — R918 Other nonspecific abnormal finding of lung field: Secondary | ICD-10-CM | POA: Diagnosis not present

## 2012-03-10 DIAGNOSIS — R5381 Other malaise: Secondary | ICD-10-CM | POA: Diagnosis not present

## 2012-03-10 DIAGNOSIS — I208 Other forms of angina pectoris: Secondary | ICD-10-CM

## 2012-03-10 DIAGNOSIS — Z006 Encounter for examination for normal comparison and control in clinical research program: Secondary | ICD-10-CM

## 2012-03-10 DIAGNOSIS — I2089 Other forms of angina pectoris: Secondary | ICD-10-CM

## 2012-03-10 DIAGNOSIS — R5383 Other fatigue: Secondary | ICD-10-CM | POA: Diagnosis not present

## 2012-03-10 DIAGNOSIS — Z79899 Other long term (current) drug therapy: Secondary | ICD-10-CM

## 2012-03-10 DIAGNOSIS — Z9889 Other specified postprocedural states: Secondary | ICD-10-CM | POA: Diagnosis not present

## 2012-03-10 LAB — CBC WITH DIFFERENTIAL/PLATELET
Basophils Relative: 1 % (ref 0–1)
Eosinophils Absolute: 0.2 10*3/uL (ref 0.0–0.7)
Eosinophils Relative: 3 % (ref 0–5)
Hemoglobin: 12.9 g/dL (ref 12.0–15.0)
MCH: 30.6 pg (ref 26.0–34.0)
MCHC: 33.9 g/dL (ref 30.0–36.0)
MCV: 90.5 fL (ref 78.0–100.0)
Monocytes Relative: 8 % (ref 3–12)
Neutrophils Relative %: 61 % (ref 43–77)

## 2012-03-10 LAB — BASIC METABOLIC PANEL
BUN: 25 mg/dL — ABNORMAL HIGH (ref 6–23)
Calcium: 8.7 mg/dL (ref 8.4–10.5)
Creatinine, Ser: 1.09 mg/dL (ref 0.50–1.10)
GFR calc Af Amer: 54 mL/min — ABNORMAL LOW (ref >90)
GFR calc non Af Amer: 47 mL/min — ABNORMAL LOW (ref >90)
Glucose, Bld: 113 mg/dL — ABNORMAL HIGH (ref 70–99)
Potassium: 3.5 mEq/L (ref 3.5–5.1)

## 2012-03-10 LAB — URINALYSIS, ROUTINE W REFLEX MICROSCOPIC
Bilirubin Urine: NEGATIVE
Glucose, UA: NEGATIVE mg/dL
Hgb urine dipstick: NEGATIVE
Ketones, ur: NEGATIVE mg/dL
Protein, ur: NEGATIVE mg/dL
Urobilinogen, UA: 0.2 mg/dL (ref 0.0–1.0)

## 2012-03-10 MED ORDER — ATROPINE SULFATE 0.1 MG/ML IJ SOLN
0.5000 mg | Freq: Once | INTRAMUSCULAR | Status: AC
  Administered 2012-03-10: 0.5 mg via INTRAVENOUS

## 2012-03-10 MED ORDER — ATROPINE SULFATE 1 MG/ML IJ SOLN
INTRAMUSCULAR | Status: AC
  Filled 2012-03-10: qty 1

## 2012-03-10 MED ORDER — GLUCAGON HCL (RDNA) 1 MG IJ SOLR
1.0000 mg | Freq: Once | INTRAMUSCULAR | Status: AC
  Administered 2012-03-10: 1 mg via INTRAVENOUS
  Filled 2012-03-10: qty 1

## 2012-03-10 MED ORDER — SODIUM CHLORIDE 0.9 % IV SOLN
1.0000 g | Freq: Once | INTRAVENOUS | Status: AC
  Administered 2012-03-10: 1 g via INTRAVENOUS
  Filled 2012-03-10: qty 10

## 2012-03-10 NOTE — ED Notes (Signed)
Pt undressed, in gown, on monitor, continuous pulse oximetry, blood pressure cuff and oxygen [place] (2L); vitals ans EKG being performed

## 2012-03-10 NOTE — ED Notes (Signed)
Assisted pt onto bedside commode

## 2012-03-10 NOTE — ED Provider Notes (Signed)
History     CSN: [id number]  Arrival date & time 03/10/12  1721   First MD Initiated Contact with Patient 03/10/12 1728      No chief complaint on file.   (Consider location/radiation/quality/duration/timing/severity/associated sxs/prior treatment) HPI Comments: Patient comes to the ER for evaluation of shortness of breath. Patient reports that she felt very short of breath today accompanying generalized weakness. EMS was called and she was brought to the emergency department. Patient is been very bradycardic for the entire transport. Heart rate is in the 30s. Patient has also been hypertensive. Patient is not expressing any chest pain or palpitations currently. She does, however report that she was having chest tightness and heaviness to the night last night.  She also reports flulike symptoms earlier in the week. She says she had a sore throat and cough with generalized body aches. These symptoms have resolved.  Patient sees Dr. Eldridge Dace at [facility].   Past Medical History  Diagnosis Date  . IHD (ischemic heart disease)     Remote MI in 2000 with stent to LAD and LCX. Failed  intervention to OM in 2009. Managed medically since that time.  . MI, old 76  . Hypertension   . Hyperlipidemia   . Tobacco abuse   . Obesity   . Granulosa cell tumor     Has had several surgeries due to her tumor  . Diverticulitis   . Dysrhythmia   . Shortness of breath   . Peripheral vascular disease   . Cancer     abd  . Atrial flutter     Past Surgical History  Procedure Date  . Cardiac catheterization 09/03/2007    EF 70%; Failed attempt at PCI to OM  . Cardiac catheterization 11/01/2003    EF 70%  . Cholecystectomy   . Parotid mass removed   . US echocardiography 11/21/2003    EF 55-60%  . Cardiovascular stress test 03/23/2009    EF 72%  . Coronary stent placement 2000    LAD and LCX  . Hernia repair laparoscopic 2005 and open 2007  . Abdominal hysterectomy   . Small  intestine surgery   . Colon surgery 2004    colectomy for diverticulosis  . Granulosa cell tumor excision 2004, 2007, 2011   . Coronary angioplasty   . Diagnostic laparoscopy     gallbladder removal; abdominal hernia repair  . Tee without cardioversion 12/31/2011    Procedure: TRANSESOPHAGEAL ECHOCARDIOGRAM (TEE);  Surgeon: Corky Crafts, MD;  Location: [facility] ENDOSCOPY;  Service: Cardiovascular;  Laterality: N/A;  . Cardioversion 12/31/2011    Procedure: CARDIOVERSION;  Surgeon: Corky Crafts, MD;  Location: [facility] ENDOSCOPY;  Service: Cardiovascular;  Laterality: N/A;    Family History  Problem Relation Age of Onset  . Heart attack Father     History  Substance Use Topics  . Smoking status: Former Smoker    Quit date: 02/03/1974  . Smokeless tobacco: Not on file  . Alcohol Use: 6.0 oz/week    10 Glasses of wine per week     Comment: patient says she has wine with dinner each day. 1 to 1.5 glasses per week.    OB History    Grav Para Term Preterm Abortions TAB SAB Ect Mult Living                  Review of Systems  Respiratory: Positive for shortness of breath.   Cardiovascular: Positive for chest pain.  Neurological: Positive for weakness.  All other systems reviewed and are negative.    Allergies  Bee venom; Adhesive; and Other  Home Medications   Current Outpatient Rx  Name  Route  Sig  Dispense  Refill  . ACETAMINOPHEN 500 MG PO TABS   Oral   Take 500 mg by mouth every 6 (six) hours as needed. For pain         . AMIODARONE HCL 400 MG PO TABS   Oral   Take 1 tablet (400 mg total) by mouth 2 (two) times daily.   60 tablet   3     Take 400mg  twice a day for 7 days then decrease to ...   . ASPIRIN EC 81 MG PO TBEC   Oral   Take 81 mg by mouth daily.         . ATORVASTATIN CALCIUM 80 MG PO TABS   Oral   Take 1 tablet (80 mg total) by mouth daily.   30 tablet   12   . CALTRATE 600 PO   Oral   Take 1 tablet by mouth daily.           . COQ10 PO   Oral   Take 100 mg by mouth daily.          Marland Kitchen DILTIAZEM HCL ER COATED BEADS 240 MG PO CP24   Oral   Take 1 capsule (240 mg total) by mouth daily.   30 capsule   3   . FUROSEMIDE 40 MG PO TABS   Oral   Take 1 tablet (40 mg total) by mouth daily.   30 tablet   3   . HYDROCHLOROTHIAZIDE 12.5 MG PO TABS   Oral   Take 12.5 mg by mouth daily.         Marland Kitchen HYDROCORTISONE 1 % EX CREA   Topical   Apply topically 3 (three) times daily. May stop once redness improved.   30 g   0   . ISOSORBIDE MONONITRATE ER 60 MG PO TB24   Oral   Take 60 mg by mouth daily.           Marland Kitchen LISINOPRIL 40 MG PO TABS   Oral   Take 40 mg by mouth daily.           Marland Kitchen METOPROLOL TARTRATE 50 MG PO TABS   Oral   Take 1.5 tablets (75 mg total) by mouth 2 (two) times daily.   90 tablet   12   . ONE-DAILY MULTI VITAMINS PO TABS   Oral   Take 1 tablet by mouth daily.           Marland Kitchen NITROGLYCERIN 0.4 MG SL SUBL   Sublingual   Place 0.4 mg under the tongue every 5 (five) minutes as needed. For chest pain         . POTASSIUM CHLORIDE CRYS ER 20 MEQ PO TBCR   Oral   Take 1 tablet (20 mEq total) by mouth daily.   30 tablet   3   . VITAMIN E 400 UNITS PO CAPS   Oral   Take 400 Units by mouth daily.           . WARFARIN SODIUM 2 MG PO TABS   Oral   Take 2 mg by mouth every evening.           BP 176/62  Pulse 37  Temp 97.9 F (36.6 C) (Oral)  Resp 20  SpO2 99%  Physical Exam  Constitutional: She is oriented to person, place, and time. She appears well-developed and well-nourished. No distress.  HENT:  Head: Normocephalic and atraumatic.  Right Ear: Hearing normal.  Nose: Nose normal.  Mouth/Throat: Oropharynx is clear and moist and mucous membranes are normal.  Eyes: Conjunctivae normal and EOM are normal. Pupils are equal, round, and reactive to light.  Neck: Normal range of motion. Neck supple.  Cardiovascular: Regular rhythm, S1 normal and S2 normal.   Bradycardia present.  Exam reveals no gallop and no friction rub.   No murmur heard. Pulmonary/Chest: Effort normal and breath sounds normal. No respiratory distress. She exhibits no tenderness.  Abdominal: Soft. Normal appearance and bowel sounds are normal. There is no hepatosplenomegaly. There is no tenderness. There is no rebound, no guarding, no tenderness at McBurney's point and negative Murphy's sign. No hernia.  Musculoskeletal: Normal range of motion.  Neurological: She is alert and oriented to person, place, and time. She has normal strength. No cranial nerve deficit or sensory deficit. Coordination normal. GCS eye subscore is 4. GCS verbal subscore is 5. GCS motor subscore is 6.  Skin: Skin is warm, dry and intact. No rash noted. No cyanosis.  Psychiatric: She has a normal mood and affect. Her speech is normal and behavior is normal. Thought content normal.    ED Course  Procedures (including critical care time)   Date: 03/10/2012  Rate: 36  Rhythm: junctional escape with LBBB or Ventricular escape  QRS Axis: left  Intervals: no clear p waves  ST/T Wave abnormalities: nonspecific ST/T changes  Conduction Disutrbances:left bundle branch block  Narrative Interpretation:   Old EKG Reviewed: previous LBBB and LAD    Labs Reviewed  CBC WITH DIFFERENTIAL - Abnormal; Notable for the following:    RDW 16.5 (*)     All other components within normal limits  BASIC METABOLIC PANEL - Abnormal; Notable for the following:    CO2 33 (*)     Glucose, Bld 113 (*)     BUN 25 (*)     GFR calc non Af Amer 47 (*)     GFR calc Af Amer 54 (*)     All other components within normal limits  URINALYSIS, ROUTINE W REFLEX MICROSCOPIC - Abnormal; Notable for the following:    Leukocytes, UA TRACE (*)     All other components within normal limits  TSH - Abnormal; Notable for the following:    TSH 12.163 (*)     All other components within normal limits  TROPONIN I  URINE MICROSCOPIC-ADD ON    Dg Chest Port 1 View  03/10/2012  *RADIOLOGY REPORT*  Clinical Data: Shortness of breath.  PORTABLE CHEST - 1 VIEW  Comparison: CT chest and chest radiograph 12/28/2011.  Findings: Trachea is midline.  Heart is enlarged.  The lungs are somewhat low in volume with  bibasilar atelectasis.  No definite pleural fluid.  IMPRESSION: Bibasilar atelectasis.   Original Report Authenticated By: Leanna Battles, M.D.      Diagnosis: Bradycardia    MDM  Patient comes to the ER for evaluation of weakness and shortness of breath. [place] the [place] fire house and was found to be profoundly bradycardic. Patient has been somewhat hypotensive with this, no periods of hypotension or instability. Patient appears to have a junctional escape rhythm. I do not see any underlying P waves, but she does reportedly have a history of atrial fibrillation. QRS complexes are regular, however. Different diagnosis would be complete heart block  with junctional escape versus Cardizem affect. Patient did have response to atropine, 136 beats a minute to 50 beats a minute immediately after 0.5 mg of atropine. She does become bradycardic again. Again has tolerated this well. Case discussed with cardiology, will see the patient in the ER for admission.        Gilda Crease, MD 03/10/12 2322

## 2012-03-10 NOTE — ED Notes (Signed)
Spoke with md regarding pt eating.  States that is ok.  Pt given [place] sand and milk.

## 2012-03-10 NOTE — ED Notes (Signed)
Pt had to urinate on arrival to ED.  Pt assisted to bedside commode after placed on monitor.

## 2012-03-10 NOTE — ED Notes (Addendum)
Report from [facility]> pt reports generalized weakness and dizziness since Sunday.  Reports sob with exertion and chest tightness that started yesterday. Pt denies pain at present but states she was unable to sleep much last night due to pain.  On EMS arrival HR 35.  Pt placed on cardiac monitor, continuous pulse ox, and zoll.  EDP to bedside.

## 2012-03-10 NOTE — ED Notes (Signed)
Pt placed on zoll pads. EDP at bedside.

## 2012-03-11 DIAGNOSIS — I495 Sick sinus syndrome: Secondary | ICD-10-CM

## 2012-03-11 DIAGNOSIS — I10 Essential (primary) hypertension: Secondary | ICD-10-CM

## 2012-03-11 LAB — COMPREHENSIVE METABOLIC PANEL
AST: 126 U/L — ABNORMAL HIGH (ref 0–37)
Albumin: 3.4 g/dL — ABNORMAL LOW (ref 3.5–5.2)
BUN: 23 mg/dL (ref 6–23)
Calcium: 8.9 mg/dL (ref 8.4–10.5)
Chloride: 104 mEq/L (ref 96–112)
Creatinine, Ser: 0.99 mg/dL (ref 0.50–1.10)
Total Bilirubin: 0.8 mg/dL (ref 0.3–1.2)

## 2012-03-11 LAB — PROTIME-INR
INR: 2.77 — ABNORMAL HIGH (ref 0.00–1.49)
Prothrombin Time: 27.9 seconds — ABNORMAL HIGH (ref 11.6–15.2)

## 2012-03-11 LAB — MRSA PCR SCREENING: MRSA by PCR: NEGATIVE

## 2012-03-11 MED ORDER — POTASSIUM CHLORIDE CRYS ER 10 MEQ PO TBCR
10.0000 meq | EXTENDED_RELEASE_TABLET | Freq: Every day | ORAL | Status: DC
  Administered 2012-03-11: 10 meq via ORAL
  Filled 2012-03-11: qty 1

## 2012-03-11 MED ORDER — ASPIRIN EC 81 MG PO TBEC: 81.0000 mg | DELAYED_RELEASE_TABLET | Freq: Every day | ORAL | Status: DC

## 2012-03-11 MED ORDER — ALUM & MAG HYDROXIDE-SIMETH 200-200-20 MG/5ML PO SUSP: 30.0000 mL | Freq: Four times a day (QID) | ORAL | Status: DC | PRN

## 2012-03-11 MED ORDER — ASPIRIN EC 81 MG PO TBEC
81.0000 mg | DELAYED_RELEASE_TABLET | Freq: Every day | ORAL | Status: DC
  Administered 2012-03-11 – 2012-03-17 (×7): 81 mg via ORAL
  Filled 2012-03-11 (×7): qty 1

## 2012-03-11 MED ORDER — WARFARIN SODIUM 1 MG PO TABS
1.0000 mg | ORAL_TABLET | ORAL | Status: AC
  Administered 2012-03-11: 1 mg via ORAL
  Filled 2012-03-11: qty 1

## 2012-03-11 MED ORDER — WARFARIN SODIUM 2 MG PO TABS
2.0000 mg | ORAL_TABLET | ORAL | Status: DC
  Administered 2012-03-12 – 2012-03-13 (×2): 2 mg via ORAL
  Filled 2012-03-11 (×3): qty 1

## 2012-03-11 MED ORDER — NITROGLYCERIN 0.4 MG SL SUBL
0.4000 mg | SUBLINGUAL_TABLET | SUBLINGUAL | Status: DC | PRN
  Administered 2012-03-13 – 2012-03-14 (×2): 0.4 mg via SUBLINGUAL
  Filled 2012-03-11: qty 25
  Filled 2012-03-11: qty 75

## 2012-03-11 MED ORDER — LISINOPRIL 10 MG PO TABS
10.0000 mg | ORAL_TABLET | Freq: Every day | ORAL | Status: DC
  Administered 2012-03-11 – 2012-03-12 (×2): 10 mg via ORAL
  Filled 2012-03-11 (×2): qty 1

## 2012-03-11 MED ORDER — ATORVASTATIN CALCIUM 80 MG PO TABS
80.0000 mg | ORAL_TABLET | Freq: Every day | ORAL | Status: DC
  Administered 2012-03-11 – 2012-03-17 (×7): 80 mg via ORAL
  Filled 2012-03-11 (×7): qty 1

## 2012-03-11 MED ORDER — SODIUM CHLORIDE 0.9 % IJ SOLN: 3.0000 mL | INTRAMUSCULAR | Status: DC | PRN

## 2012-03-11 MED ORDER — SODIUM CHLORIDE 0.9 % IJ SOLN
3.0000 mL | Freq: Two times a day (BID) | INTRAMUSCULAR | Status: DC
  Administered 2012-03-11 – 2012-03-16 (×11): 3 mL via INTRAVENOUS

## 2012-03-11 MED ORDER — ACETAMINOPHEN 650 MG RE SUPP: 650.0000 mg | Freq: Four times a day (QID) | RECTAL | Status: DC | PRN

## 2012-03-11 MED ORDER — ATROPINE SULFATE 1 MG/ML IJ SOLN
INTRAMUSCULAR | Status: AC
  Filled 2012-03-11: qty 1

## 2012-03-11 MED ORDER — POTASSIUM CHLORIDE CRYS ER 20 MEQ PO TBCR
40.0000 meq | EXTENDED_RELEASE_TABLET | Freq: Every day | ORAL | Status: DC
  Administered 2012-03-11 – 2012-03-15 (×5): 40 meq via ORAL
  Filled 2012-03-11 (×6): qty 2
  Filled 2012-03-11: qty 1

## 2012-03-11 MED ORDER — WARFARIN - PHARMACIST DOSING INPATIENT
Freq: Every day | Status: DC
  Administered 2012-03-11 – 2012-03-15 (×2)

## 2012-03-11 MED ORDER — SODIUM CHLORIDE 0.9 % IV SOLN: 250.0000 mL | INTRAVENOUS | Status: DC | PRN

## 2012-03-11 MED ORDER — ACETAMINOPHEN 325 MG PO TABS: 650.0000 mg | ORAL_TABLET | Freq: Four times a day (QID) | ORAL | Status: DC | PRN

## 2012-03-11 MED ORDER — ISOSORBIDE MONONITRATE ER 60 MG PO TB24
60.0000 mg | ORAL_TABLET | Freq: Every day | ORAL | Status: DC
  Administered 2012-03-11 – 2012-03-17 (×7): 60 mg via ORAL
  Filled 2012-03-11 (×7): qty 1

## 2012-03-11 MED ORDER — FUROSEMIDE 40 MG PO TABS
40.0000 mg | ORAL_TABLET | Freq: Every day | ORAL | Status: DC
  Administered 2012-03-11 – 2012-03-17 (×7): 40 mg via ORAL
  Filled 2012-03-11 (×7): qty 1

## 2012-03-11 MED ORDER — SODIUM CHLORIDE 0.9 % IJ SOLN
3.0000 mL | Freq: Two times a day (BID) | INTRAMUSCULAR | Status: DC
  Administered 2012-03-11 – 2012-03-16 (×10): 3 mL via INTRAVENOUS

## 2012-03-11 NOTE — Progress Notes (Signed)
EP consult obtained for tachybradycardia syndrome.  They will see her later today.  She is unlikely to get a pacemaker today.  Will allow her to eat.  Need some help with medication adjustment to get around the extremes caused by the tachybradycardia syndrome.

## 2012-03-11 NOTE — ED Notes (Signed)
Report called to floor.  Pt transported to floor with monitor and on stretcher.

## 2012-03-11 NOTE — Progress Notes (Signed)
Dr Anne Fu paged and updated on pt status. Pt converted into a-fib around 0600 this am. Pt remains NPO at this time. Dr Anne Fu stated he would follow up with Dr Eldridge Dace. Pt resting comfortably in room, husband at bedside.

## 2012-03-11 NOTE — Progress Notes (Addendum)
ANTICOAGULATION CONSULT NOTE - Initial Consult  Pharmacy Consult for Coumadin Indication: atrial fibrillation  Allergies  Allergen Reactions  . Bee Venom Anaphylaxis  . Adhesive (Tape)   . Other     Pain medications cause severe vomiting.    Patient Measurements: Height: 5' (152.4 cm) Weight: 176 lb 2.4 oz (79.9 kg) IBW/kg (Calculated) : 45.5   Vital Signs: Temp: 97.9 F (36.6 C) (02/05 1744) Temp src: Oral (02/05 1744) BP: 177/64 mmHg (02/06 0200) Pulse Rate: 41  (02/06 0200)  Labs:  Basename 03/10/12 1844 03/10/12 1841  HGB -- 12.9  HCT -- 38.1  PLT -- 195  APTT -- --  LABPROT -- --  INR -- --  HEPARINUNFRC -- --  CREATININE -- 1.09  CKTOTAL -- --  CKMB -- --  TROPONINI <0.30 --    Estimated Creatinine Clearance: 39.2 ml/min (by C-G formula based on Cr of 1.09).   Medical History: Past Medical History  Diagnosis Date  . IHD (ischemic heart disease)     Remote MI in 2000 with stent to LAD and LCX. Failed  intervention to OM in 2009. Managed medically since that time.  . MI, old 54  . Hypertension   . Hyperlipidemia   . Tobacco abuse   . Obesity   . Granulosa cell tumor     Has had several surgeries due to her tumor  . Diverticulitis   . Dysrhythmia   . Shortness of breath   . Peripheral vascular disease   . Cancer     abd  . Atrial flutter     Medications:  Scheduled:    . aspirin EC  81 mg Oral Daily  . atorvastatin  80 mg Oral Daily  . [COMPLETED] atropine  0.5 mg Intravenous Once  . [COMPLETED] atropine      . atropine      . [COMPLETED] calcium gluconate 1 GM IV  1 g Intravenous Once  . furosemide  40 mg Oral Daily  . [COMPLETED] glucagon  1 mg Intravenous Once  . isosorbide mononitrate  60 mg Oral Daily  . potassium chloride SA  10 mEq Oral Daily  . sodium chloride  3 mL Intravenous Q12H  . sodium chloride  3 mL Intravenous Q12H  . [DISCONTINUED] aspirin EC  81 mg Oral Daily    Assessment: 79 yo female admitted with fatigue.  Patient is on Coumadin 2mg  daily, except 1mg  on Tuesday, Thursday and Sunday PTA for h/o atrial fibrillation. Baseline INR pending. Pharmacy to manage Coumadin.   Goal of Therapy:  INR 2-3 Monitor platelets by anticoagulation protocol: Yes   Plan:  1. Follow-up baseline INR.  2. Daily PT / INR 3. Coumadin education with pharmacist.   Thad Ranger, Mellody Drown 03/11/2012,2:18 AM   Addendum: INR on admit is at-goal (2.77). Continue home Coumadin regimen.   Lorre Munroe, PharmD  03/11/12, 05:58 AM

## 2012-03-11 NOTE — Care Management Note (Signed)
    Page 1 of 1   03/11/2012     8:45:46 AM   CARE MANAGEMENT NOTE 03/11/2012  Patient:  Patty [name], Patty [name]   Account Number:  [id number]  Date Initiated:  03/11/2012  Documentation initiated by:  Junius Creamer  Subjective/Objective Assessment:   adm w bradycardia     Action/Plan:   lives w husband, pcp dr Theressa Millard   Anticipated DC Date:     Anticipated DC Plan:        DC Planning Services  CM consult      Choice offered to / List presented to:             Status of service:   Medicare Important Message given?   (If response is "NO", the following Medicare IM given date fields will be blank) Date Medicare IM given:   Date Additional Medicare IM given:    Discharge Disposition:    Per UR Regulation:  Reviewed for med. necessity/level of care/duration of stay  If discussed at Long Length of Stay Meetings, dates discussed:    Comments:  2/6 6 debbie Chantella Creech rn,bsn

## 2012-03-11 NOTE — H&P (Signed)
History and Physical  Patient ID: Patty [name] MRN: [id number], SOB: 19-Feb-1930 79 y.o. Date of Encounter: 03/11/2012, 12:21 AM  Primary Physician: Darnelle Bos, MD Primary Cardiologist: Dr. Eldridge Dace  Chief Complaint: fatigue  HPI: 79 y.o. female w/ PMHx significant for afib with RVR, HTN, CAD s/p MI and stents, CHF with pEF who presented to [facility] on 03/11/2012 with complaints of feeling fatigue.  Review of chart indicates a hospitalization in November when she had afib with RVR that was difficult to control (failed cardioversion, on amiodarone, dilt and metoprolol). Also with CHF excerebration and troponin elevation. She had follow up with her cardiologist at the end of January and was doing well (no CHF symptoms, weight stable, activity level at baseline).  However, on Sunday of this week, she reports feeling "crummy" and achy. She initially was concerned about flu like symptoms but no fever, cough, or HEENT sxs. She subsequently had nausea and vomiting on Monday which resolved but her fatigue continue. She has signficant DOE and her ability to even do ADLs was reduced.  Her blood pressure had been elevated on her husbands machine in the 200s so she sought assistance at the local fire station who found her blood pressure in the 200s and pulse in the 40s.  At presentation at the ER, she was found to be brady in the 30s with an escape rhythm (has LBBB at baseline, morphology similar but slightly different). With atropine, her HR improved to the 50s but per ER, p waves never visuallized. Also given calcium and glucagon.  She denies any syncope or pre-syncope. No fluttering or palpitations. Chest pain last night that was similar to her baseline angina- no significant worsening of her angina.  EKG revealed wide complex (similar to baseline LBBB) escape rhythm in the 40s. On telemetry, she does have intermittently conducted P waves and the morphology of the QRS changes  slightly.   Past Medical History  Diagnosis Date  . IHD (ischemic heart disease)     Remote MI in 2000 with stent to LAD and LCX. Failed  intervention to OM in 2009. Managed medically since that time.  . MI, old 47  . Hypertension   . Hyperlipidemia   . Tobacco abuse   . Obesity   . Granulosa cell tumor     Has had several surgeries due to her tumor  . Diverticulitis   . Dysrhythmia   . Shortness of breath   . Peripheral vascular disease   . Cancer     abd  . Atrial flutter      Surgical History:  Past Surgical History  Procedure Date  . Cardiac catheterization 09/03/2007    EF 70%; Failed attempt at PCI to OM  . Cardiac catheterization 11/01/2003    EF 70%  . Cholecystectomy   . Parotid mass removed   . US echocardiography 11/21/2003    EF 55-60%  . Cardiovascular stress test 03/23/2009    EF 72%  . Coronary stent placement 2000    LAD and LCX  . Hernia repair laparoscopic 2005 and open 2007  . Abdominal hysterectomy   . Small intestine surgery   . Colon surgery 2004    colectomy for diverticulosis  . Granulosa cell tumor excision 2004, 2007, 2011   . Coronary angioplasty   . Diagnostic laparoscopy     gallbladder removal; abdominal hernia repair  . Tee without cardioversion 12/31/2011    Procedure: TRANSESOPHAGEAL ECHOCARDIOGRAM (TEE);  Surgeon: Corky Crafts, MD;  Location: [facility] ENDOSCOPY;  Service: Cardiovascular;  Laterality: N/A;  . Cardioversion 12/31/2011    Procedure: CARDIOVERSION;  Surgeon: Corky Crafts, MD;  Location: [facility] ENDOSCOPY;  Service: Cardiovascular;  Laterality: N/A;     Home Meds: Prior to Admission medications   Medication Sig Start Date End Date Taking? Authorizing Provider  acetaminophen (TYLENOL) 500 MG tablet Take 500 mg by mouth every 6 (six) hours as needed. For pain   Yes Historical Provider, MD  aspirin EC 81 MG tablet Take 81 mg by mouth daily.   Yes Historical Provider, MD  atorvastatin (LIPITOR) 80 MG tablet Take 1  tablet (80 mg total) by mouth daily. 01/02/12  Yes Donato Schultz, MD  Calcium Carbonate (CALTRATE 600 PO) Take 1 tablet by mouth daily.    Yes Historical Provider, MD  Coenzyme Q10 (COQ10 PO) Take 100 mg by mouth daily. Hold while in hospital   Yes Historical Provider, MD  diltiazem (CARDIZEM CD) 240 MG 24 hr capsule Take 1 capsule (240 mg total) by mouth daily. 01/02/12  Yes Donato Schultz, MD  furosemide (LASIX) 40 MG tablet Take 1 tablet (40 mg total) by mouth daily. 01/02/12  Yes Donato Schultz, MD  hydrocortisone cream 1 % Apply 1 application topically daily as needed. For rash 01/02/12  Yes Donato Schultz, MD  isosorbide mononitrate (IMDUR) 60 MG 24 hr tablet Take 60 mg by mouth daily.     Yes Historical Provider, MD  metoprolol (LOPRESSOR) 50 MG tablet Take 1.5 tablets (75 mg total) by mouth 2 (two) times daily. 01/02/12  Yes Donato Schultz, MD  Multiple Vitamin (MULTIVITAMIN) tablet Take 1 tablet by mouth daily.     Yes Historical Provider, MD  nitroGLYCERIN (NITROSTAT) 0.4 MG SL tablet Place 0.4 mg under the tongue every 5 (five) minutes as needed. For chest pain   Yes Historical Provider, MD  omega-3 acid ethyl esters (LOVAZA) 1 G capsule Take 1 g by mouth daily as needed. Patient states she only takes it when she thinks about it.   Yes Historical Provider, MD  potassium chloride SA (K-DUR,KLOR-CON) 20 MEQ tablet Take 10 mEq by mouth daily. 01/02/12  Yes Donato Schultz, MD  vitamin E (VITAMIN E) 400 UNIT capsule Take 400 Units by mouth daily.     Yes Historical Provider, MD  warfarin (COUMADIN) 2 MG tablet Take 2 mg by mouth every evening. Tuesday, Thursday and Sunday half tab. Whole tablet rest of days   Yes Historical Provider, MD    Allergies:  Allergies  Allergen Reactions  . Bee Venom Anaphylaxis  . Adhesive (Tape)   . Other     Pain medications cause severe vomiting.    History   Social History  . Marital Status: Married    Spouse Name: N/A    Number of Children: N/A  . Years of  Education: N/A   Occupational History  . Not on file.   Social History Main Topics  . Smoking status: Former Smoker    Quit date: 02/03/1974  . Smokeless tobacco: Not on file  . Alcohol Use: 6.0 oz/week    10 Glasses of wine per week     Comment: patient says she has wine with dinner each day. 1 to 1.5 glasses per week.  . Drug Use: No  . Sexually Active: No   Other Topics Concern  . Not on file   Social History Narrative  . No narrative on file     Family History  Problem Relation Age of  Onset  . Heart attack Father     Review of Systems: General: negative for chills, fever, night sweats or weight changes.  Cardiovascular: see HPI  Dermatological: negative for rash Respiratory: negative for cough or wheezing Urologic: negative for hematuria Abdominal: +n/v --> resolved, diarrhea, bright red blood per rectum, melena, or hematemesis Neurologic: negative for visual changes, syncope, or dizziness All other systems reviewed and are otherwise negative except as noted above.  Labs:   Lab Results  Component Value Date   WBC 6.1 03/10/2012   HGB 12.9 03/10/2012   HCT 38.1 03/10/2012   MCV 90.5 03/10/2012   PLT 195 03/10/2012    Lab 03/10/12 1841  NA 143  K 3.5  CL 102  CO2 33*  BUN 25*  CREATININE 1.09  CALCIUM 8.7  PROT --  BILITOT --  ALKPHOS --  ALT --  AST --  GLUCOSE 113*    Basename 03/10/12 1844  CKTOTAL --  CKMB --  TROPONINI <0.30   Lab Results  Component Value Date   CHOL 134 10/03/2010   HDL 62.80 10/03/2010   LDLCALC 58 10/03/2010   TRIG 66.0 10/03/2010   No results found for this basename: DDIMER    Radiology/Studies:  Dg Chest Port 1 View  03/10/2012  *RADIOLOGY REPORT*  Clinical Data: Shortness of breath.  PORTABLE CHEST - 1 VIEW  Comparison: CT chest and chest radiograph 12/28/2011.  Findings: Trachea is midline.  Heart is enlarged.  The lungs are somewhat low in volume with  bibasilar atelectasis.  No definite pleural fluid.  IMPRESSION:  Bibasilar atelectasis.   Original Report Authenticated By: Leanna Battles, M.D.      EKG: see HPI  Physical Exam: Blood pressure 124/94, pulse 39, temperature 97.9 F (36.6 C), temperature source Oral, resp. rate 26, SpO2 97.00%. General: in no acute distress. Head: Normocephalic, atraumatic, sclera non-icteric, nares are without discharge Neck: Supple. Negative for carotid bruits. JVD not elevated. Lungs: Clear bilaterally to auscultation without wheezes, rales, or rhonchi. Breathing is unlabored. Heart: bradycardia, no murmurs Abdomen: Soft, non-tender, non-distended with normoactive bowel sounds. No rebound/guarding. No obvious abdominal masses. Msk:  Strength and tone appear normal for age. Extremities: No edema. No clubbing or cyanosis. Distal pedal pulses are 2+ and equal bilaterally. Neuro: Alert and oriented X 3. Moves all extremities spontaneously. Psych:  Responds to questions appropriately with a normal affect.   1. Bradycardia with escape rhythm, intermittent sinus brady 2. H/o atrial fibrillation, difficult to control, now on medical meds. (Tachy-brady syndrome) 3. H/o HFpEF, currently euvolemia 4. HTN 5. Hyperlipidemia 6. CAD with chronic angina, stable  ASSESSMENT AND PLAN:  79 y.o. female w/ PMHx significant for afib with RVR, HTN, CAD s/p MI and stents, CHF with pEF who presented to [facility] on 03/11/2012 with complaints of feeling fatigue now found to be in escape rhythm.  Encouragingly, she is tolerating the escape rhythm well from a hemodynamic standpoint and she does have intermittent sinus rhythm. Has underlying conduction disease (afib, baseline LBBB) and now has deteriorated further and precipitated by her necessary nodal blocking agents. Plan at this time is to hold her BB, CCB and amiodarone and monitor for improvement. Pacemaker is a possibility due to her clear need for the nodal blocking meds (significant difficulty last hospitalization with  uncontrolled afib).   Her blood pressure has been rather labile she reports but currently, it is well controlled. With removal of some of her blood pressure meds, vasodilators may be necessary (ACEI,  etc.) Will monitor for now.  Continue anticoagulation for paroxysmal afib.  Has history of HFpEF but currently appears euvolemic. Continue current diuretic regimen.  Continue long acting nitrate and statin for chronic angina.  Full code. NPO in case procedure is warranted. On full anticoagulation.     Signed, Rebbeca Sheperd C. MD 03/11/2012, 12:21 AM

## 2012-03-12 DIAGNOSIS — I498 Other specified cardiac arrhythmias: Secondary | ICD-10-CM

## 2012-03-12 DIAGNOSIS — I4891 Unspecified atrial fibrillation: Secondary | ICD-10-CM

## 2012-03-12 MED ORDER — LISINOPRIL 5 MG PO TABS
5.0000 mg | ORAL_TABLET | Freq: Every day | ORAL | Status: DC
  Administered 2012-03-13 – 2012-03-17 (×5): 5 mg via ORAL
  Filled 2012-03-12 (×5): qty 1

## 2012-03-12 MED ORDER — WARFARIN SODIUM 1 MG PO TABS: 1.0000 mg | ORAL_TABLET | ORAL | Status: DC

## 2012-03-12 MED ORDER — WARFARIN SODIUM 1 MG PO TABS
1.0000 mg | ORAL_TABLET | ORAL | Status: DC
  Administered 2012-03-14: 1 mg via ORAL
  Filled 2012-03-12: qty 1

## 2012-03-12 NOTE — Progress Notes (Signed)
ANTICOAGULATION CONSULT NOTE - Follow Up Consult  Pharmacy Consult for Coumadin Indication: atrial fibrillation  Allergies  Allergen Reactions  . Bee Venom Anaphylaxis  . Adhesive (Tape)   . Other     Pain medications cause severe vomiting.    Patient Measurements: Height: 5' (152.4 cm) Weight: 172 lb 2.9 oz (78.1 kg) IBW/kg (Calculated) : 45.5   Vital Signs: Temp: 98.2 F (36.8 C) (02/07 0822) Temp src: Oral (02/07 0822) BP: 143/95 mmHg (02/07 0822) Pulse Rate: 100  (02/07 0822)  Labs:  Basename 03/12/12 0520 03/11/12 0455 03/10/12 1844 03/10/12 1841  HGB -- -- -- 12.9  HCT -- -- -- 38.1  PLT -- -- -- 195  APTT -- -- -- --  LABPROT 24.3* 27.9* -- --  INR 2.30* 2.77* -- --  HEPARINUNFRC -- -- -- --  CREATININE -- 0.99 -- 1.09  CKTOTAL -- -- -- --  CKMB -- -- -- --  TROPONINI -- -- <0.30 --    Estimated Creatinine Clearance: 42.6 ml/min (by C-G formula based on Cr of 0.99).   Assessment: 79 yo female admitted with fatigue. Patient is on Coumadin 2mg  daily, except 1mg  on Tuesday, Thursday and Sunday PTA for h/o atrial fibrillation. INR therapeutic at 1.30. Noted potential plans for PM placement.   Goal of Therapy:  INR 2-3 Monitor platelets by anticoagulation protocol: Yes   Plan:  Continue home regimen and f/u INR tomorrow   Thank you,  Brett Fairy, PharmD, BCPS 03/12/2012 9:45 AM

## 2012-03-12 NOTE — Progress Notes (Signed)
SUBJECTIVE:  EP consult obtained for tachybradycardia syndrome.   Need some help with medication adjustment to get around the extremes caused by the tachybradycardia syndrome.  Discussed pacemaker the patient.  She may be a candidate for a leadless pacer if she qualifies for the study.  Given that she has felt well when she is rate controlled,: Not place a lot of emphasis on keeping her in sinus rhythm.  Of note, she has had increased LFTs on amiodarone so this will need to be stopped.  OBJECTIVE:   Vitals:   Filed Vitals:   03/12/12 0352 03/12/12 0609 03/12/12 0822 03/12/12 1247  BP:   143/95 108/55  Pulse:   100 112  Temp: 98.4 F (36.9 C)  98.2 F (36.8 C) 98.3 F (36.8 C)  TempSrc: Oral  Oral Oral  Resp:      Height:      Weight:  78.1 kg (172 lb 2.9 oz)    SpO2:   94% 96%   I&O's:   Intake/Output Summary (Last 24 hours) at 03/12/12 1313 Last data filed at 03/12/12 0900  Gross per 24 hour  Intake    675 ml  Output      0 ml  Net    675 ml   TELEMETRY: Reviewed telemetry pt in atrial fibrillation:     PHYSICAL EXAM General: Well developed, well nourished, in no acute distress Head:    Normal cephalic and atramatic  Lungs:   Clear bilaterally to auscultation and percussion. Heart:  Irregularly irregular, borderline rate control Abdomen: abdomen soft and non-tender Msk:   Normal strength and tone for age. Extremities:   No  edema.  DP +1 Neuro: Alert and oriented X 3. Psych:  Good affect, responds appropriately   LABS: Basic Metabolic Panel:  Basename 03/11/12 0455 03/10/12 1841  NA 143 143  K 3.1* 3.5  CL 104 102  CO2 30 33*  GLUCOSE 92 113*  BUN 23 25*  CREATININE 0.99 1.09  CALCIUM 8.9 8.7  MG -- --  PHOS -- --   Liver Function Tests:  Basename 03/11/12 0455  AST 126*  ALT 228*  ALKPHOS 142*  BILITOT 0.8  PROT 6.3  ALBUMIN 3.4*   No results found for this basename: LIPASE:2,AMYLASE:2 in the last 72 hours CBC:  Basename 03/10/12 1841  WBC  6.1  NEUTROABS 3.7  HGB 12.9  HCT 38.1  MCV 90.5  PLT 195   Cardiac Enzymes:  Basename 03/10/12 1844  CKTOTAL --  CKMB --  CKMBINDEX --  TROPONINI <0.30   BNP: No components found with this basename: POCBNP:3 D-Dimer: No results found for this basename: DDIMER:2 in the last 72 hours Hemoglobin A1C: No results found for this basename: HGBA1C in the last 72 hours Fasting Lipid Panel: No results found for this basename: CHOL,HDL,LDLCALC,TRIG,CHOLHDL,LDLDIRECT in the last 72 hours Thyroid Function Tests:  Basename 03/10/12 1841  TSH 12.163*  T4TOTAL --  T3FREE --  THYROIDAB --   Anemia Panel: No results found for this basename: VITAMINB12,FOLATE,FERRITIN,TIBC,IRON,RETICCTPCT in the last 72 hours Coag Panel:   Lab Results  Component Value Date   INR 2.30* 03/12/2012   INR 2.77* 03/11/2012   INR 2.84* 01/02/2012    RADIOLOGY: Dg Chest Port 1 View  03/10/2012  *RADIOLOGY REPORT*  Clinical Data: Shortness of breath.  PORTABLE CHEST - 1 VIEW  Comparison: CT chest and chest radiograph 12/28/2011.  Findings: Trachea is midline.  Heart is enlarged.  The lungs are somewhat low in  volume with  bibasilar atelectasis.  No definite pleural fluid.  IMPRESSION: Bibasilar atelectasis.   Original Report Authenticated By: Leanna Battles, M.D.       ASSESSMENT: Atrial fibrillation.  She was on amiodarone but now has evidence of hypothyroidism and increased LFTs.  PLAN:  We'll watch rate control over the weekend.  May need to add back low-dose diltiazem to help with rate control.  If she still has episodes of tachycardia, she may require a VVI pacemaker.  No evidence of heart failure.  She has felt well in the past with rate control atrial fibrillation.  Left ventricular  ejection fraction is been normal.  Coumadin for stroke prevention  Coronary artery disease: Stable.  No angina.  Corky Crafts., MD  03/12/2012  1:13 PM

## 2012-03-12 NOTE — Consult Note (Signed)
ELECTROPHYSIOLOGY CONSULT NOTE    Patient ID: Patty [name] MRN: [id number], DOB/AGE: 1930-03-25 79 y.o.  Admit date: 03/10/2012 Date of Consult: 03-12-2012  Primary Physician: Darnelle Bos, MD Primary Cardiologist: Everette Rank, MD  Reason for Consultation: tachy-brady syndrome  HPI:  [name] is a 79 year old female whom we have been asked to see for tachy-brady syndrome.    She was first diagnosed with atrial fibrillation in November of last year.  She was placed on Coumadin for anticoagulation and underwent cardioversion on 12-31-2011 which was unsuccessful.  She was then placed on Amiodarone, Diltiazem, and Metoprolol.  She had follow up with [facility] at the end of January and was doing well.  On Sunday, she developed feelings of fatigue and aches.  She also had an increase in shortness of breath.  She went to a local fire station where her blood pressure was found to be in the 200's and her HR was in the 40's.  She then came to [facility] for further evaluation.   On arrival to the ER, she was found to be in a junctional escape with intermittent .  Her medications were held and she returned to afib.  Telemetry has demonstrated atrial fibrillation with a relatively controlled rate at rest, but with tachycardia with minimal activity.  Echocardiogram done 12-29-2011 demonstrates an EF of 60-65%, mild MR, LA size of 42.  Past medical history is also significant for coronary artery disease, s/p MI and stents, hypertension, and hyperlipidemia.   ROS is negative except as outlined above.    Past Medical History  Diagnosis Date  . IHD (ischemic heart disease)     Remote MI in 2000 with stent to LAD and LCX. Failed  intervention to OM in 2009. Managed medically since that time.  . MI, old 29  . Hypertension   . Hyperlipidemia   . Tobacco abuse   . Obesity   . Granulosa cell tumor     Has had several surgeries due to her tumor  . Diverticulitis   . Dysrhythmia   .  Shortness of breath   . Peripheral vascular disease   . Cancer     abd  . Atrial flutter      Surgical History:  Past Surgical History  Procedure Date  . Cardiac catheterization 09/03/2007    EF 70%; Failed attempt at PCI to OM  . Cardiac catheterization 11/01/2003    EF 70%  . Cholecystectomy   . Parotid mass removed   . US echocardiography 11/21/2003    EF 55-60%  . Cardiovascular stress test 03/23/2009    EF 72%  . Coronary stent placement 2000    LAD and LCX  . Hernia repair laparoscopic 2005 and open 2007  . Abdominal hysterectomy   . Small intestine surgery   . Colon surgery 2004    colectomy for diverticulosis  . Granulosa cell tumor excision 2004, 2007, 2011   . Coronary angioplasty   . Diagnostic laparoscopy     gallbladder removal; abdominal hernia repair  . Tee without cardioversion 12/31/2011    Procedure: TRANSESOPHAGEAL ECHOCARDIOGRAM (TEE);  Surgeon: Corky Crafts, MD;  Location: [facility] ENDOSCOPY;  Service: Cardiovascular;  Laterality: N/A;  . Cardioversion 12/31/2011    Procedure: CARDIOVERSION;  Surgeon: Corky Crafts, MD;  Location: [facility] ENDOSCOPY;  Service: Cardiovascular;  Laterality: N/A;     Prescriptions prior to admission  Medication Sig Dispense Refill  . acetaminophen (TYLENOL) 500 MG tablet Take 500 mg by  mouth every 6 (six) hours as needed. For pain      . aspirin EC 81 MG tablet Take 81 mg by mouth daily.      Marland Kitchen atorvastatin (LIPITOR) 80 MG tablet Take 1 tablet (80 mg total) by mouth daily.  30 tablet  12  . Calcium Carbonate (CALTRATE 600 PO) Take 1 tablet by mouth daily.       . Coenzyme Q10 (COQ10 PO) Take 100 mg by mouth daily. Hold while in hospital      . diltiazem (CARDIZEM CD) 240 MG 24 hr capsule Take 1 capsule (240 mg total) by mouth daily.  30 capsule  3  . furosemide (LASIX) 40 MG tablet Take 1 tablet (40 mg total) by mouth daily.  30 tablet  3  . hydrocortisone cream 1 % Apply 1 application topically daily as needed. For  rash      . isosorbide mononitrate (IMDUR) 60 MG 24 hr tablet Take 60 mg by mouth daily.        . metoprolol (LOPRESSOR) 50 MG tablet Take 1.5 tablets (75 mg total) by mouth 2 (two) times daily.  90 tablet  12  . Multiple Vitamin (MULTIVITAMIN) tablet Take 1 tablet by mouth daily.        . nitroGLYCERIN (NITROSTAT) 0.4 MG SL tablet Place 0.4 mg under the tongue every 5 (five) minutes as needed. For chest pain      . omega-3 acid ethyl esters (LOVAZA) 1 G capsule Take 1 g by mouth daily as needed. Patient states she only takes it when she thinks about it.      . potassium chloride SA (K-DUR,KLOR-CON) 20 MEQ tablet Take 10 mEq by mouth daily.      . vitamin E (VITAMIN E) 400 UNIT capsule Take 400 Units by mouth daily.        Marland Kitchen warfarin (COUMADIN) 2 MG tablet Take 2 mg by mouth every evening. Tuesday, Thursday and Sunday half tab. Whole tablet rest of days        Inpatient Medications:    . aspirin EC  81 mg Oral Daily  . atorvastatin  80 mg Oral Daily  . furosemide  40 mg Oral Daily  . isosorbide mononitrate  60 mg Oral Daily  . lisinopril  10 mg Oral Daily  . potassium chloride  40 mEq Oral Daily  . sodium chloride  3 mL Intravenous Q12H  . sodium chloride  3 mL Intravenous Q12H  . warfarin  2 mg Oral Custom  . Warfarin - Pharmacist Dosing Inpatient   Does not apply q1800    Allergies:  Allergies  Allergen Reactions  . Bee Venom Anaphylaxis  . Adhesive (Tape)   . Other     Pain medications cause severe vomiting.    History   Social History  . Marital Status: Married    Spouse Name: N/A    Number of Children: N/A  . Years of Education: N/A   Occupational History  . Not on file.   Social History Main Topics  . Smoking status: Former Smoker    Quit date: 02/03/1974  . Smokeless tobacco: Not on file  . Alcohol Use: 6.0 oz/week    10 Glasses of wine per week     Comment: patient says she has wine with dinner each day. 1 to 1.5 glasses per week.  . Drug Use: No  .  Sexually Active: No   Other Topics Concern  . Not on file   Social  History Narrative  . No narrative on file     Family History  Problem Relation Age of Onset  . Heart attack Father     Physical Exam: Filed Vitals:   03/12/12 0352 03/12/12 0609 03/12/12 0822 03/12/12 1247  BP:   143/95 108/55  Pulse:   100 112  Temp: 98.4 F (36.9 C)  98.2 F (36.8 C) 98.3 F (36.8 C)  TempSrc: Oral  Oral Oral  Resp:      Height:      Weight:  172 lb 2.9 oz (78.1 kg)    SpO2:   94% 96%    GEN- The patient is overweight appearing, alert and oriented x 3 today.   Head- normocephalic, atraumatic Eyes-  Sclera clear, conjunctiva pink Ears- hearing intact Oropharynx- clear Neck- supple, no JVP Lymph- no cervical lymphadenopathy Lungs- Clear to ausculation bilaterally, normal work of breathing Heart- irregular rate and rhythm  GI- soft, NT, ND, + BS Extremities- no clubbing, cyanosis, or edema MS- no significant deformity or atrophy Skin- no rash or lesion Psych- euthymic mood, full affect Neuro- strength and sensation are intact   Labs:   Lab Results  Component Value Date   WBC 6.1 03/10/2012   HGB 12.9 03/10/2012   HCT 38.1 03/10/2012   MCV 90.5 03/10/2012   PLT 195 03/10/2012    Lab 03/11/12 0455  NA 143  K 3.1*  CL 104  CO2 30  BUN 23  CREATININE 0.99  CALCIUM 8.9  PROT 6.3  BILITOT 0.8  ALKPHOS 142*  ALT 228*  AST 126*  GLUCOSE 92    Radiology/Studies: Dg Chest Port 1 View 03/10/2012  *RADIOLOGY REPORT*  Clinical Data: Shortness of breath.  PORTABLE CHEST - 1 VIEW  Comparison: CT chest and chest radiograph 12/28/2011.  Findings: Trachea is midline.  Heart is enlarged.  The lungs are somewhat low in volume with  bibasilar atelectasis.  No definite pleural fluid.  IMPRESSION: Bibasilar atelectasis.   Original Report Authenticated By: Leanna Battles, M.D.     ION:GEXBMW fib, rate 97, LBBB  TELEMETRY: atrial fib with rates 90-110's   Assessment and Plan: 1. Persistent  AFib Very difficult to maintain sinus rhythm Given increase in LFTs, I think that we should stop amiodarone She is not interested in further cardioversions or attempts to achieve sinus rhythm  2. Tachycardia/ bradycardia She has had difficulty with RVR quite frequently, however she was admitted for symptomatic bradycardia.  I think that she will require PPM long term.  She is very interested in our leadless pacing technology and would be an excellent candidate for this. Risks, benefits, alternatives to pacemaker implantation were discussed in detail with the patient today. The patient understands that the risks include but are not limited to bleeding, infection, pneumothorax, perforation, tamponade, vascular damage, renal failure, MI, stroke, death,  and lead dislodgement and thinks she would like to proceed.  She is very interested in considering our leadless pacer trial (LEADLESS II).  I will therefore have my research staff speak with her today.  If she consents, I should be able to implant the device on Tuesday.  Fayrene Fearing Kailo Kosik,MD

## 2012-03-13 NOTE — Progress Notes (Signed)
ANTICOAGULATION CONSULT NOTE - Follow Up Consult  Pharmacy Consult for Coumadin Indication: atrial fibrillation  Allergies  Allergen Reactions  . Bee Venom Anaphylaxis  . Adhesive (Tape)   . Other     Pain medications cause severe vomiting.    Patient Measurements: Height: 5' (152.4 cm) Weight: 173 lb 15.1 oz (78.9 kg) IBW/kg (Calculated) : 45.5  Vital Signs: Temp: 97.8 F (36.6 C) (02/08 0800) Temp src: Oral (02/08 0800) BP: 123/70 mmHg (02/08 0800) Pulse Rate: 117 (02/07 2311)  Labs:  Recent Labs  03/10/12 1841 03/10/12 1844 03/11/12 0455 03/12/12 0520 03/13/12 0500  HGB 12.9  --   --   --   --   HCT 38.1  --   --   --   --   PLT 195  --   --   --   --   LABPROT  --   --  27.9* 24.3* 24.0*  INR  --   --  2.77* 2.30* 2.26*  CREATININE 1.09  --  0.99  --   --   TROPONINI  --  <0.30  --   --   --     Estimated Creatinine Clearance: 42.8 ml/min (by C-G formula based on Cr of 0.99).   Assessment: 79 yo female admitted with fatigue. Patient is on Coumadin 2mg  daily, except 1mg  on Tuesday, Thursday and Sunday PTA for h/o atrial fibrillation. INR therapeutic at 2.26. Noted potential plans for PM placement.  Goal of Therapy:  INR 2-3 Monitor platelets by anticoagulation protocol: Yes   Plan:  Continue home regimen and f/u INR tomorrow   Thank you,  Brett Fairy, PharmD, BCPS 03/13/2012 9:52 AM

## 2012-03-13 NOTE — Progress Notes (Signed)
Subjective:  No complaints, no shortness of breath, no chest pain. Mild palpitations.  Objective:  Vital Signs in the last 24 hours: Temp:  [97.8 F (36.6 C)-99 F (37.2 C)] 97.8 F (36.6 C) (02/08 0800) Pulse Rate:  [93-117] 117 (02/07 2311) Resp:  [14-18] 14 (02/08 0800) BP: (92-139)/(55-70) 123/70 mmHg (02/08 0800) SpO2:  [92 %-99 %] 94 % (02/08 0800) Weight:  [78.9 kg (173 lb 15.1 oz)] 78.9 kg (173 lb 15.1 oz) (02/08 0500)  Intake/Output from previous day: 02/07 0701 - 02/08 0700 In: 1045 [P.O.:1045] Out: 300 [Urine:300]   Physical Exam: General: Well developed, well nourished, in no acute distress. Head:  Normocephalic and atraumatic. Lungs: Clear to auscultation and percussion. Heart: Irregularly irregular  No murmur, rubs or gallops.  Abdomen: soft, non-tender, positive bowel sounds. Extremities: No clubbing or cyanosis. No edema. Neurologic: Alert and oriented x 3.    Lab Results:  Recent Labs  03/10/12 1841  WBC 6.1  HGB 12.9  PLT 195    Recent Labs  03/10/12 1841 03/11/12 0455  NA 143 143  K 3.5 3.1*  CL 102 104  CO2 33* 30  GLUCOSE 113* 92  BUN 25* 23  CREATININE 1.09 0.99    Recent Labs  03/10/12 1844  TROPONINI <0.30   Hepatic Function Panel  Recent Labs  03/11/12 0455  PROT 6.3  ALBUMIN 3.4*  AST 126*  ALT 228*  ALKPHOS 142*  BILITOT 0.8   No results found for this basename: CHOL,  in the last 72 hours   Telemetry: Atrial fibrillation, occasional rapid ventricular response. No significant pauses. Pulse previously in the 40s. Personally viewed.     Assessment/Plan:  Active Problems:   * No active hospital problems. *  81 year with atrial fibrillation, tachycardia/bradycardia syndrome, elevated LFTs, hypothyroidism.  1. Atrial fibrillation-at rest currently this morning her heart rate is ranging between 97 and 105. When she gets up to go to the bathroom can increase to 120. She has discussion with Dr. Johney Frame. She told me  about this. We'll be continuing to watch rate control over the weekend. At this point, I will not add back diltiazem but we may need to if her heart rate increases significantly. EF is normal.  2. Chronic anticoagulation-Coumadin. Appreciate pharmacy assistance.  3. Coronary artery disease-stable without any evidence of angina.  4. Hypertension-as high as 200 systolic at the fire station. Overall doing well. Iman Orourke 03/13/2012, 10:22 AM

## 2012-03-13 NOTE — Progress Notes (Addendum)
Pt called for assistance; c/o of 5/10 chest pain/pressure; HR 125 Afib; pt sweaty and clammy; 1 SL NTG given; pain was relieved within 5 minute interval;  EKG obtained showing AFIB with RVR; however, HR is not sustaining in the 120s; will continue to assess and call MD on call if HR increases and remains elevated;

## 2012-03-14 LAB — TROPONIN I
Troponin I: <0.3 ng/mL (ref <0.30)
Troponin I: 0.81 ng/mL (ref <0.30)

## 2012-03-14 LAB — PROTIME-INR: INR: 2.16 — ABNORMAL HIGH (ref 0.00–1.49)

## 2012-03-14 MED ORDER — ONDANSETRON HCL 4 MG/2ML IJ SOLN: 4.0000 mg | Freq: Four times a day (QID) | INTRAMUSCULAR | Status: DC | PRN

## 2012-03-14 MED ORDER — NITROGLYCERIN IN D5W 200-5 MCG/ML-% IV SOLN
2.0000 ug/min | INTRAVENOUS | Status: DC
  Administered 2012-03-14: 10 ug/min via INTRAVENOUS
  Administered 2012-03-14: 5 ug/min via INTRAVENOUS
  Filled 2012-03-14 (×2): qty 250

## 2012-03-14 MED ORDER — SODIUM CHLORIDE 0.9 % IV SOLN
INTRAVENOUS | Status: DC
  Administered 2012-03-14: 08:00:00 via INTRAVENOUS

## 2012-03-14 MED ORDER — DILTIAZEM HCL 30 MG PO TABS
30.0000 mg | ORAL_TABLET | Freq: Four times a day (QID) | ORAL | Status: DC
  Administered 2012-03-14 – 2012-03-15 (×3): 30 mg via ORAL
  Filled 2012-03-14 (×7): qty 1

## 2012-03-14 NOTE — Progress Notes (Signed)
Subjective:  Had some mild chest discomfort earlier this morning. Nitroglycerin drip continued. Currently feels well. Her husband Elijah Birk was here earlier. No shortness of breath, no syncope.  Objective:  Vital Signs in the last 24 hours: Temp:  [97.7 F (36.5 C)-99 F (37.2 C)] 98.7 F (37.1 C) (02/09 0815) Resp:  [16-18] 18 (02/09 0815) BP: (115-176)/(67-115) 128/104 mmHg (02/09 1010) SpO2:  [90 %-99 %] 97 % (02/09 1010) Weight:  [78.2 kg (172 lb 6.4 oz)] 78.2 kg (172 lb 6.4 oz) (02/09 0500)  Intake/Output from previous day: 02/08 0701 - 02/09 0700 In: 1416 [P.O.:1410; I.V.:6] Out: 1550 [Urine:1550]   Physical Exam: General: Well developed, well nourished, in no acute distress.  Head: Normocephalic and atraumatic.  Lungs: Clear to auscultation and percussion.  Heart: Irregularly irregular, mildly tachycardic No murmur, rubs or gallops.  Abdomen: soft, non-tender, positive bowel sounds.  Extremities: No clubbing or cyanosis. No edema.  Neurologic: Alert and oriented x 3.     Telemetry: No significant bradycardia. Tachycardic atrial fibrillation 120 currently while getting new IV placed. Personally viewed.    Assessment/Plan:   79 year old with atrial fibrillation, tachycardia/bradycardia syndrome, elevated LFTs, hypothyroidism, chest discomfort  1. Atrial fibrillation-slightly elevated rate today/this morning however she was getting an IV placed at that time. Continuing to monitor rate control. No significant bradycardia noted. Heart rate is reasonable currently. May need to add diltiazem and future. Hopeful pacemaker placement soon.  2.Chronic anticoagulation-Coumadin. Appreciate pharmacy assistance. INR 2.1   3. Coronary artery disease-stable, however she did have chest discomfort earlier this morning, on nitroglycerin drip. Troponin this morning was normal.  4. Hypertension-as high as 200 systolic at the fire station. Overall doing well. Nitroglycerin drip. I will write for  her to have lab work tomorrow. Previous hypokalemia.   SKAINS, MARK 03/14/2012, 11:25 AM

## 2012-03-14 NOTE — Progress Notes (Signed)
Pt. Complaining of chest pressure (heaviness).5/10.  1 sl NTG given with good effect.  PA notified.

## 2012-03-14 NOTE — Progress Notes (Signed)
See NO from Dr. Anne Fu.  Pt. To resume diltiazem as ordered by MD.  Will monitor HR carefully.

## 2012-03-14 NOTE — Progress Notes (Signed)
ANTICOAGULATION CONSULT NOTE - Follow Up Consult  Pharmacy Consult for Coumadin Indication: atrial fibrillation  Allergies  Allergen Reactions  . Bee Venom Anaphylaxis  . Adhesive (Tape)   . Other     Pain medications cause severe vomiting.    Patient Measurements: Height: 5' (152.4 cm) Weight: 172 lb 6.4 oz (78.2 kg) IBW/kg (Calculated) : 45.5  Vital Signs: Temp: 98.7 F (37.1 C) (02/09 0815) Temp src: Oral (02/09 0815) BP: 128/104 mmHg (02/09 1010)  Labs:  Recent Labs  03/12/12 0520 03/13/12 0500 03/14/12 0600 03/14/12 0839  LABPROT 24.3* 24.0* 23.2*  --   INR 2.30* 2.26* 2.16*  --   TROPONINI  --   --   --  <0.30    Estimated Creatinine Clearance: 42.6 ml/min (by C-G formula based on Cr of 0.99).   Assessment: 79 yo female admitted with fatigue. Patient is on Coumadin 2mg  daily, except 1mg  on Tuesday, Thursday and Sunday PTA for h/o atrial fibrillation. INR therapeutic. Noted potential plans for PM placement.   Goal of Therapy:  INR 2-3 Monitor platelets by anticoagulation protocol: Yes   Plan:  Continue home regimen and f/u INR tomorrow  Thank you,  Brett Fairy, PharmD, BCPS 03/14/2012 11:07 AM

## 2012-03-14 NOTE — Progress Notes (Signed)
Critical troponin of 0.5 called to this nurse from lab.  MD to be notified.

## 2012-03-14 NOTE — Progress Notes (Signed)
B/P differential of 30 mm of Hg. Between right and left arm.  Most accurate reading appears to be on right arm.  Pt. Asymptomatic for b/p of 88 systolic on left.  126 on right.  Will cont. To monitor.

## 2012-03-15 ENCOUNTER — Telehealth: Payer: Self-pay

## 2012-03-15 DIAGNOSIS — I2510 Atherosclerotic heart disease of native coronary artery without angina pectoris: Secondary | ICD-10-CM

## 2012-03-15 LAB — BASIC METABOLIC PANEL
CO2: 33 mEq/L — ABNORMAL HIGH (ref 19–32)
Calcium: 8.8 mg/dL (ref 8.4–10.5)
Chloride: 101 mEq/L (ref 96–112)
GFR calc Af Amer: 60 mL/min — ABNORMAL LOW (ref >90)
Sodium: 140 mEq/L (ref 135–145)

## 2012-03-15 LAB — HEPATIC FUNCTION PANEL
Alkaline Phosphatase: 99 U/L (ref 39–117)
Indirect Bilirubin: 0.3 mg/dL (ref 0.3–0.9)
Total Protein: 5.9 g/dL — ABNORMAL LOW (ref 6.0–8.3)

## 2012-03-15 LAB — PROTIME-INR
INR: 1.99 — ABNORMAL HIGH (ref 0.00–1.49)
Prothrombin Time: 21.8 seconds — ABNORMAL HIGH (ref 11.6–15.2)

## 2012-03-15 MED ORDER — SODIUM CHLORIDE 0.45 % IV SOLN
INTRAVENOUS | Status: DC
  Administered 2012-03-16: 11:00:00 via INTRAVENOUS

## 2012-03-15 MED ORDER — METOPROLOL TARTRATE 25 MG PO TABS
25.0000 mg | ORAL_TABLET | Freq: Four times a day (QID) | ORAL | Status: DC
  Administered 2012-03-15 – 2012-03-17 (×7): 25 mg via ORAL
  Filled 2012-03-15 (×15): qty 1

## 2012-03-15 MED ORDER — CEFAZOLIN SODIUM-DEXTROSE 2-3 GM-% IV SOLR
2.0000 g | INTRAVENOUS | Status: DC
  Administered 2012-03-16: 2 g via INTRAVENOUS
  Filled 2012-03-15 (×2): qty 50

## 2012-03-15 NOTE — Progress Notes (Signed)
SUBJECTIVE:  EP consult obtained for tachybradycardia syndrome.  Planning for a leadless pacer.  Given that she has felt well when she is rate controlled, will Not place a lot of emphasis on keeping her in sinus rhythm.  Of note, she has had increased LFTs on amiodarone so this will need to be stopped.  OBJECTIVE:   Vitals:   Filed Vitals:   03/15/12 0500 03/15/12 0615 03/15/12 0620 03/15/12 0744  BP:  116/72 116/72 154/103  Pulse:    88  Temp:  97.9 F (36.6 C)  97.9 F (36.6 C)  TempSrc:  Oral  Oral  Resp:      Height:      Weight: 80.105 kg (176 lb 9.6 oz)     SpO2:  93%  95%   I&O's:    Intake/Output Summary (Last 24 hours) at 03/15/12 0908 Last data filed at 03/15/12 0700  Gross per 24 hour  Intake  875.6 ml  Output    701 ml  Net  174.6 ml   TELEMETRY: Reviewed telemetry pt in atrial fibrillation:     PHYSICAL EXAM General: Well developed, well nourished, in no acute distress Head:    Normal cephalic and atramatic  Lungs:   Clear bilaterally to auscultation and percussion. Heart:  Irregularly irregular, borderline rate control Abdomen: abdomen soft and non-tender Msk:   Normal strength and tone for age. Extremities:   No  edema.  DP +1 Neuro: Alert and oriented X 3. Psych:  Good affect, responds appropriately   LABS: Basic Metabolic Panel:  Recent Labs  40/98/11 0605  NA 140  K 4.0  CL 101  CO2 33*  GLUCOSE 105*  BUN 23  CREATININE 1.01  CALCIUM 8.8   Liver Function Tests:  Recent Labs  03/15/12 0605  AST 34  ALT 73*  ALKPHOS 99  BILITOT 0.4  PROT 5.9*  ALBUMIN 2.6*   No results found for this basename: LIPASE, AMYLASE,  in the last 72 hours CBC: No results found for this basename: WBC, NEUTROABS, HGB, HCT, MCV, PLT,  in the last 72 hours Cardiac Enzymes:  Recent Labs  03/14/12 0839 03/14/12 1415 03/14/12 2016  TROPONINI <0.30 0.57* 0.81*   BNP: No components found with this basename: POCBNP,  D-Dimer: No results found for  this basename: DDIMER,  in the last 72 hours Hemoglobin A1C: No results found for this basename: HGBA1C,  in the last 72 hours Fasting Lipid Panel: No results found for this basename: CHOL, HDL, LDLCALC, TRIG, CHOLHDL, LDLDIRECT,  in the last 72 hours Thyroid Function Tests: No results found for this basename: TSH, T4TOTAL, FREET3, T3FREE, THYROIDAB,  in the last 72 hours Anemia Panel: No results found for this basename: VITAMINB12, FOLATE, FERRITIN, TIBC, IRON, RETICCTPCT,  in the last 72 hours Coag Panel:   Lab Results  Component Value Date   INR 1.99* 03/15/2012   INR 2.16* 03/14/2012   INR 2.26* 03/13/2012    RADIOLOGY: Dg Chest Port 1 View  03/10/2012  *RADIOLOGY REPORT*  Clinical Data: Shortness of breath.  PORTABLE CHEST - 1 VIEW  Comparison: CT chest and chest radiograph 12/28/2011.  Findings: Trachea is midline.  Heart is enlarged.  The lungs are somewhat low in volume with  bibasilar atelectasis.  No definite pleural fluid.  IMPRESSION: Bibasilar atelectasis.   Original Report Authenticated By: Leanna Battles, M.D.       ASSESSMENT: Atrial fibrillation.  She was on amiodarone but now has evidence of hypothyroidism and increased  LFTs.  PLAN:  Rate increased.  Mild angina.  Now relieved.  Off NTG.  Change diltiazem to metoprolol 25 mg q 6 hours.  Pacer tomorrow.  Coumadin for stroke prevention  Coronary artery disease: Mild troponin increase, may be related to combination if increased rate and diastolic heart failure.  COntinue to manage sx medically.    Corky Crafts., MD  03/15/2012  9:08 AM

## 2012-03-15 NOTE — Telephone Encounter (Signed)
Patty [name] wanted Dr. Darrold Span to know that she is having a small pacemaker placed tomorrow afternoon.  [facility]  She will call Dr. Darrold Span at the end of the week to let her know how things went.

## 2012-03-15 NOTE — Progress Notes (Signed)
ELECTROPHYSIOLOGY ROUNDING NOTE    Patient Name: Patty [name] Date of Encounter: 03-15-2012    SUBJECTIVE:Patient feels well this morning.  No chest pain since Sunday am, no shortness of breath.  NTG d/c'd this am.  Per nursing staff, pt's chest pain correlated with higher ventricular rates.    TELEMETRY: Reviewed telemetry pt in atrial fibrillation, rates 90-120 Filed Vitals:   03/15/12 0500 03/15/12 0615 03/15/12 0620 03/15/12 0744  BP:  116/72 116/72 154/103  Pulse:    88  Temp:  97.9 F (36.6 C)  97.9 F (36.6 C)  TempSrc:  Oral  Oral  Resp:      Height:      Weight: 176 lb 9.6 oz (80.105 kg)     SpO2:  93%  95%    Intake/Output Summary (Last 24 hours) at 03/15/12 8295 Last data filed at 03/15/12 0700  Gross per 24 hour  Intake 1127.98 ml  Output    701 ml  Net 426.98 ml    LABS: Basic Metabolic Panel:  Recent Labs  62/13/08 0605  NA 140  K 4.0  CL 101  CO2 33*  GLUCOSE 105*  BUN 23  CREATININE 1.01  CALCIUM 8.8   Liver Function Tests:  Recent Labs  03/15/12 0605  AST 34  ALT 73*  ALKPHOS 99  BILITOT 0.4  PROT 5.9*  ALBUMIN 2.6*   Cardiac Enzymes:  Recent Labs  03/14/12 0839 03/14/12 1415 03/14/12 2016  TROPONINI <0.30 0.57* 0.81*   INR: 1.99  Radiology/Studies:  Dg Chest Port 1 View 03/10/2012  *RADIOLOGY REPORT*  Clinical Data: Shortness of breath.  PORTABLE CHEST - 1 VIEW  Comparison: CT chest and chest radiograph 12/28/2011.  Findings: Trachea is midline.  Heart is enlarged.  The lungs are somewhat low in volume with  bibasilar atelectasis.  No definite pleural fluid.  IMPRESSION: Bibasilar atelectasis.   Original Report Authenticated By: Leanna Battles, M.D.     Plan for single chamber pacemaker tomorrow reviewed by Dr Johney Frame with patient.  Dr Johney Frame will be in contact with Dr Eldridge Dace today regarding patient's chest pain.    Gypsy Balsam, RN, BSN 03/15/2012 8:15 AM   I have seen, examined the patient, and reviewed the  above assessment and plan.  Changes to above are made where necessary.  She has had some chest pain over the weekend in the setting of RVR.  This appears to be resolved presently.  She is comfortable at this time, without concerns.  Physical Exam: Filed Vitals:   03/15/12 0500 03/15/12 0615 03/15/12 0620 03/15/12 0744  BP:  116/72 116/72 154/103  Pulse:    88  Temp:  97.9 F (36.6 C)  97.9 F (36.6 C)  TempSrc:  Oral  Oral  Resp:      Height:      Weight: 176 lb 9.6 oz (80.105 kg)     SpO2:  93%  95%    GEN- The patient is well appearing, alert and oriented x 3 today.   Head- normocephalic, atraumatic Eyes-  Sclera clear, conjunctiva pink Ears- hearing intact Oropharynx- clear Neck- supple, no JVP Lymph- no cervical lymphadenopathy Lungs- Clear to ausculation bilaterally, normal work of breathing Heart- tachycardic irregular rhythm, no murmurs, rubs or gallops, PMI not laterally displaced GI- soft, NT, ND, + BS Extremities- no clubbing, cyanosis, or edema Neuro- strength and sensation are intact  Assessment and Plan: 1. Elevated troponin/ CAD I have spoken with Dr Eldridge Dace at length today.  She  has known CAD with occluded OM chronically.  Elevated Tn and chest pain are likely due to demand related to elevated ventricular rates and highlight the importance of rate control long term.  Dr Eldridge Dace does not feel that further CV risk stratification is required at this time and I agree.   We will start metoprolol and follow closely for bradycardia (see below).  2. Tachy/brady As above, we need to control V rates.  This is limited by presenting symptomatic bradycardia earlier this hospitalization. The patient has symptomatic bradycardia with reversible cause and requires AV nodal agents long term due to afib with RVR.  I would therefore recommend pacemaker implantation at this time.  Risks, benefits, alternatives to pacemaker implantation were discussed in detail with the patient today.   We discussed both traditional and our leadless pacemaker as options in detail.  The patient understands that the risks of leadless pacemaker include but are not limited to bleeding, infection, perforation, tamponade, vascular damage, renal failure, MI, stroke, death,  and device dislodgement and wishes to proceed. We will therefore schedule the procedure at the next available time (presently scheduled for tomorrow afternoon).  3. afib As above Will hold coumadin for the procedure tomorrow  Co Sign: Hillis Range, MD 03/15/2012 9:29 AM

## 2012-03-15 NOTE — Progress Notes (Signed)
ANTICOAGULATION CONSULT NOTE - Follow Up Consult  Pharmacy Consult for Coumadin Indication: atrial fibrillation  Allergies  Allergen Reactions  . Bee Venom Anaphylaxis  . Adhesive (Tape)   . Other     Pain medications cause severe vomiting.    Patient Measurements: Height: 5' (152.4 cm) Weight: 176 lb 9.6 oz (80.105 kg) IBW/kg (Calculated) : 45.5  Vital Signs: Temp: 97.9 F (36.6 C) (02/10 0744) Temp src: Oral (02/10 0744) BP: 154/103 mmHg (02/10 0744) Pulse Rate: 88 (02/10 0744)  Labs:  Recent Labs  03/13/12 0500 03/14/12 0600 03/14/12 0839 03/14/12 1415 03/14/12 2016 03/15/12 0605  LABPROT 24.0* 23.2*  --   --   --  21.8*  INR 2.26* 2.16*  --   --   --  1.99*  CREATININE  --   --   --   --   --  1.01  TROPONINI  --   --  <0.30 0.57* 0.81*  --     Estimated Creatinine Clearance: 42.3 ml/min (by C-G formula based on Cr of 1.01).  Assessment: 79 yo female admitted with fatigue. Patient is on Coumadin 2mg  daily, except 1mg  on Tuesday, Thursday and Sunday PTA for h/o atrial fibrillation. INR just below goal at 1.99 this morning. Noted potential plans for PM placement in am. D/w Dr.Allred and will hold warfarin tonight.   Goal of Therapy:  INR 2-3 Monitor platelets by anticoagulation protocol: Yes   Plan:  Hold warfarin for procedure in am  Thank you,  Sheppard Coil, PharmD, BCPS 03/15/2012 10:35 AM

## 2012-03-16 ENCOUNTER — Encounter (HOSPITAL_COMMUNITY)
Admission: EM | Disposition: A | Discharge status: Home / Self Care | Payer: Self-pay | Source: Home / Self Care | Attending: Interventional Cardiology

## 2012-03-16 DIAGNOSIS — I495 Sick sinus syndrome: Secondary | ICD-10-CM

## 2012-03-16 HISTORY — PX: PERMANENT PACEMAKER INSERTION: SHX5480

## 2012-03-16 LAB — BASIC METABOLIC PANEL
CO2: 32 mEq/L (ref 19–32)
Chloride: 103 mEq/L (ref 96–112)
Creatinine, Ser: 0.98 mg/dL (ref 0.50–1.10)
Glucose, Bld: 103 mg/dL — ABNORMAL HIGH (ref 70–99)
Sodium: 142 mEq/L (ref 135–145)

## 2012-03-16 LAB — PROTIME-INR: INR: 1.76 — ABNORMAL HIGH (ref 0.00–1.49)

## 2012-03-16 SURGERY — PERMANENT PACEMAKER INSERTION
Anesthesia: LOCAL

## 2012-03-16 MED ORDER — POTASSIUM CHLORIDE 20 MEQ/15ML (10%) PO LIQD
40.0000 meq | Freq: Every day | ORAL | Status: DC
  Administered 2012-03-16 – 2012-03-17 (×2): 40 meq via ORAL
  Filled 2012-03-16 (×2): qty 30

## 2012-03-16 MED ORDER — CEFAZOLIN SODIUM 1-5 GM-% IV SOLN
1.0000 g | Freq: Four times a day (QID) | INTRAVENOUS | Status: AC
  Administered 2012-03-16 – 2012-03-17 (×3): 1 g via INTRAVENOUS
  Filled 2012-03-16 (×3): qty 50

## 2012-03-16 MED ORDER — ACETAMINOPHEN 325 MG PO TABS: 325.0000 mg | ORAL_TABLET | ORAL | Status: DC | PRN

## 2012-03-16 MED ORDER — FENTANYL CITRATE 0.05 MG/ML IJ SOLN
INTRAMUSCULAR | Status: AC
  Filled 2012-03-16: qty 2

## 2012-03-16 MED ORDER — HEPARIN (PORCINE) IN NACL 2-0.9 UNIT/ML-% IJ SOLN
INTRAMUSCULAR | Status: AC
  Filled 2012-03-16: qty 1000

## 2012-03-16 MED ORDER — MIDAZOLAM HCL 5 MG/5ML IJ SOLN
INTRAMUSCULAR | Status: AC
  Filled 2012-03-16: qty 5

## 2012-03-16 MED ORDER — ONDANSETRON HCL 4 MG/2ML IJ SOLN: 4.0000 mg | Freq: Four times a day (QID) | INTRAMUSCULAR | Status: DC | PRN

## 2012-03-16 MED ORDER — BUPIVACAINE HCL (PF) 0.25 % IJ SOLN
INTRAMUSCULAR | Status: AC
  Filled 2012-03-16: qty 30

## 2012-03-16 NOTE — Op Note (Signed)
SURGEON:  Hillis Range, MD     PREPROCEDURE DIAGNOSIS:  Symptomatic tachycardia bradycardia syndrome, persistent atrial fibrillation    POSTPROCEDURE DIAGNOSIS:  Symptomatic tachycardia bradycardia syndrome, persistent atrial fibrillation     PROCEDURES:   1. Right Ventriculogram.   2. Leadless Pacemaker implantation.     INTRODUCTION: Patty [name] is a 79 y.o. female  with a history of symptomatic tachycardia bradycardia syndrome and persistent atrial fibrillationwho presents today for pacemaker implantation.   The patient therefore presents today for pacemaker implantation.     DESCRIPTION OF PROCEDURE:  Informed written consent was obtained, and the patient was brought to the electrophysiology lab in a fasting state.  The patient required no sedation for the procedure today.   Using a percutaneous Seldinger technique, an 8-French was placed into the right common femoral vein.  A venogram of the right ventricle was performed by hand injection of nonionic contrast.  This demonstrated a rather normal appearing right ventricle.  The 8 french sheath was exchanged for an 18 french sheath.  A [employer] [name] Leadless Cardiac Pacemaker model [id number] (SN [id number]) was advanced through the right femoral vein into the right ventricular apex position and actively fixed to the myocardial wall.  In this location R waves measured 7mV with an impedance of 640 Ohms and a threshold of 0.5V@0 . .  Stability of the device was demonstrated with a "tug test".  The device was disconnected from the implantation apparatus which was then removed from the body and the sheaths were aspirated and flushed.  The sheaths were removed and hemostasis was assured.  There were no early apparent complications.      CONCLUSIONS:   1.Successful implantation of a SJM nannostim VVI investigational pacemaker.   2. No early apparent complications.         Hillis Range, MD 03/16/2012 3:52 PM

## 2012-03-16 NOTE — Interval H&P Note (Signed)
History and Physical Interval Note:  03/16/2012 8:03 AM  Patty [name]  has presented today for surgery, with the diagnosis of Heart Block  The various methods of treatment have been discussed with the patient and family. After consideration of risks, benefits and other options for treatment, the patient has consented to  Procedure(s): PERMANENT PACEMAKER INSERTION (N/A) as a surgical intervention .  The patient's history has been reviewed, patient examined, no change in status, stable for surgery.  I have reviewed the patient's chart and labs.  Questions were answered to the patient's satisfaction.     Hillis Range

## 2012-03-16 NOTE — Progress Notes (Signed)
Called report to [place], RN from [place] when pt did not return to 2900 after pacemaker placement.   Delynn Flavin, RN, BSN

## 2012-03-16 NOTE — H&P (View-Only) (Signed)
   ELECTROPHYSIOLOGY ROUNDING NOTE    Patient Name: Patty [name] Date of Encounter: 03-16-2012    SUBJECTIVE:Patient feels well.  No shortness of breath. Some chest pain last night associated with RVR, relieved with rest.  Plan for single chamber pacemaker today.   TELEMETRY: Reviewed telemetry pt in atrial fibrillation, rates 90-120's Physical Exam: Filed Vitals:   03/15/12 1957 03/16/12 0011 03/16/12 0500 03/16/12 0526  BP:  139/88  134/86  Pulse:      Temp:  97.5 F (36.4 C) 97.9 F (36.6 C)   TempSrc:  Oral Oral   Resp:      Height:      Weight:   176 lb 14.4 oz (80.241 kg)   SpO2: 94% 96% 93%     GEN- The patient is well appearing, alert and oriented x 3 today.   Head- normocephalic, atraumatic Eyes-  Sclera clear, conjunctiva pink Ears- hearing intact Oropharynx- clear Neck- supple, no JVP Lymph- no cervical lymphadenopathy Lungs- Clear to ausculation bilaterally, normal work of breathing Heart- irregular rate and rhythm, no murmurs, rubs or gallops, PMI not laterally displaced GI- soft, NT, ND, + BS Extremities- no clubbing, cyanosis, or edema   LABS: Basic Metabolic Panel:  Recent Labs  03/15/12 0605 03/16/12 0446  NA 140 142  K 4.0 3.9  CL 101 103  CO2 33* 32  GLUCOSE 105* 103*  BUN 23 21  CREATININE 1.01 0.98  CALCIUM 8.8 8.9   Liver Function Tests:  Recent Labs  03/15/12 0605  AST 34  ALT 73*  ALKPHOS 99  BILITOT 0.4  PROT 5.9*  ALBUMIN 2.6*   No results found for this basename: LIPASE, AMYLASE,  in the last 72 hours CBC: No results found for this basename: WBC, NEUTROABS, HGB, HCT, MCV, PLT,  in the last 72 hours Cardiac Enzymes:  Recent Labs  03/14/12 0839 03/14/12 1415 03/14/12 2016  TROPONINI <0.30 0.57* 0.81*   INR: 1.76  Radiology/Studies:  Dg Chest Port 1 View 03/10/2012  *RADIOLOGY REPORT*  Clinical Data: Shortness of breath.  PORTABLE CHEST - 1 VIEW  Comparison: CT chest and chest radiograph 12/28/2011.  Findings:  Trachea is midline.  Heart is enlarged.  The lungs are somewhat low in volume with  bibasilar atelectasis.  No definite pleural fluid.  IMPRESSION: Bibasilar atelectasis.   Original Report Authenticated By: Melinda Blietz, M.D.    Assessment and Plan:  1. Elevated troponin/ CAD  Improved with rate control of afib.  We will continue to titrate metoprolol as able.    2. Tachy/brady  As above, we need to control V rates. This is limited by presenting symptomatic bradycardia earlier this hospitalization.  The patient has symptomatic bradycardia with reversible cause and requires AV nodal agents long term due to afib with RVR. I would therefore recommend pacemaker implantation at this time. Risks, benefits, alternatives to pacemaker implantation were discussed in detail with the patient today. We discussed both traditional and our leadless pacemaker as options in detail. The patient understands that the risks of leadless pacemaker include but are not limited to bleeding, infection, perforation, tamponade, vascular damage, renal failure, MI, stroke, death, and device dislodgement and wishes to proceed. We will therefore schedule the procedure at the next available time (presently scheduled for tomorrow afternoon).   3. afib  As above  Will hold coumadin for the procedure  

## 2012-03-16 NOTE — Progress Notes (Signed)
Informed that pt would not be returning to 2900. Packed up belongings and sent to cath lab with NT and RN as witness. Paperwork, bathing supplies, shoes, jacket x2, gloves, underwear, books, cell phone, cell phone charger, purse, and flowers (x2) among items sent to cath lab. Room verified empty by NT Pat.   Delynn Flavin, RN, BSN

## 2012-03-16 NOTE — Progress Notes (Signed)
   ELECTROPHYSIOLOGY ROUNDING NOTE    Patient Name: Patty [name] Date of Encounter: 03-16-2012    SUBJECTIVE:Patient feels well.  No shortness of breath. Some chest pain last night associated with RVR, relieved with rest.  Plan for single chamber pacemaker today.   TELEMETRY: Reviewed telemetry pt in atrial fibrillation, rates 90-120's Physical Exam: Filed Vitals:   03/15/12 1957 03/16/12 0011 03/16/12 0500 03/16/12 0526  BP:  139/88  134/86  Pulse:      Temp:  97.5 F (36.4 C) 97.9 F (36.6 C)   TempSrc:  Oral Oral   Resp:      Height:      Weight:   176 lb 14.4 oz (80.241 kg)   SpO2: 94% 96% 93%     GEN- The patient is well appearing, alert and oriented x 3 today.   Head- normocephalic, atraumatic Eyes-  Sclera clear, conjunctiva pink Ears- hearing intact Oropharynx- clear Neck- supple, no JVP Lymph- no cervical lymphadenopathy Lungs- Clear to ausculation bilaterally, normal work of breathing Heart- irregular rate and rhythm, no murmurs, rubs or gallops, PMI not laterally displaced GI- soft, NT, ND, + BS Extremities- no clubbing, cyanosis, or edema   LABS: Basic Metabolic Panel:  Recent Labs  16/10/96 0605 03/16/12 0446  NA 140 142  K 4.0 3.9  CL 101 103  CO2 33* 32  GLUCOSE 105* 103*  BUN 23 21  CREATININE 1.01 0.98  CALCIUM 8.8 8.9   Liver Function Tests:  Recent Labs  03/15/12 0605  AST 34  ALT 73*  ALKPHOS 99  BILITOT 0.4  PROT 5.9*  ALBUMIN 2.6*   No results found for this basename: LIPASE, AMYLASE,  in the last 72 hours CBC: No results found for this basename: WBC, NEUTROABS, HGB, HCT, MCV, PLT,  in the last 72 hours Cardiac Enzymes:  Recent Labs  03/14/12 0839 03/14/12 1415 03/14/12 2016  TROPONINI <0.30 0.57* 0.81*   INR: 1.76  Radiology/Studies:  Dg Chest Port 1 View 03/10/2012  *RADIOLOGY REPORT*  Clinical Data: Shortness of breath.  PORTABLE CHEST - 1 VIEW  Comparison: CT chest and chest radiograph 12/28/2011.  Findings:  Trachea is midline.  Heart is enlarged.  The lungs are somewhat low in volume with  bibasilar atelectasis.  No definite pleural fluid.  IMPRESSION: Bibasilar atelectasis.   Original Report Authenticated By: Leanna Battles, M.D.    Assessment and Plan:  1. Elevated troponin/ CAD  Improved with rate control of afib.  We will continue to titrate metoprolol as able.    2. Tachy/brady  As above, we need to control V rates. This is limited by presenting symptomatic bradycardia earlier this hospitalization.  The patient has symptomatic bradycardia with reversible cause and requires AV nodal agents long term due to afib with RVR. I would therefore recommend pacemaker implantation at this time. Risks, benefits, alternatives to pacemaker implantation were discussed in detail with the patient today. We discussed both traditional and our leadless pacemaker as options in detail. The patient understands that the risks of leadless pacemaker include but are not limited to bleeding, infection, perforation, tamponade, vascular damage, renal failure, MI, stroke, death, and device dislodgement and wishes to proceed. We will therefore schedule the procedure at the next available time (presently scheduled for tomorrow afternoon).   3. afib  As above  Will hold coumadin for the procedure

## 2012-03-17 ENCOUNTER — Ambulatory Visit (HOSPITAL_COMMUNITY): Payer: Medicare Other

## 2012-03-17 ENCOUNTER — Inpatient Hospital Stay (HOSPITAL_COMMUNITY): Payer: Medicare Other

## 2012-03-17 ENCOUNTER — Inpatient Hospital Stay (HOSPITAL_COMMUNITY): Admission: RE | Admit: 2012-03-17 | Payer: Medicare Other | Source: Ambulatory Visit

## 2012-03-17 LAB — PROTIME-INR
INR: 1.34 (ref 0.00–1.49)
Prothrombin Time: 16.3 seconds — ABNORMAL HIGH (ref 11.6–15.2)

## 2012-03-17 LAB — BASIC METABOLIC PANEL
CO2: 33 mEq/L — ABNORMAL HIGH (ref 19–32)
Chloride: 102 mEq/L (ref 96–112)
GFR calc Af Amer: 60 mL/min — ABNORMAL LOW (ref >90)
Potassium: 4 mEq/L (ref 3.5–5.1)

## 2012-03-17 MED ORDER — LISINOPRIL 5 MG PO TABS: 5.0000 mg | ORAL_TABLET | Freq: Every day | ORAL | Status: DC

## 2012-03-17 MED ORDER — YOU HAVE A PACEMAKER BOOK
Freq: Once | Status: AC
  Administered 2012-03-17: 02:00:00
  Filled 2012-03-17: qty 1

## 2012-03-17 MED ORDER — METOPROLOL SUCCINATE ER 100 MG PO TB24: 100.0000 mg | ORAL_TABLET | Freq: Every day | ORAL | Status: DC

## 2012-03-17 MED ORDER — METOPROLOL SUCCINATE ER 100 MG PO TB24
100.0000 mg | ORAL_TABLET | Freq: Every day | ORAL | Status: DC
  Administered 2012-03-17: 10:00:00 100 mg via ORAL
  Filled 2012-03-17: qty 1

## 2012-03-17 NOTE — Discharge Summary (Signed)
Patient ID: Patty [name] MRN: [id number] DOB/AGE: 1931-01-31 79 y.o.  Admit date: 03/10/2012 Discharge date: 03/17/2012  Primary Discharge Diagnosis tachy-brady syndrome Secondary Discharge Diagnosis:AFib, with RVR, HTN  Significant Diagnostic Studies: Pacemaker placement  Consults: Electrophysiology  Hospital Course: 79 year old woman who was admitted with fatigue.  She was found to be in a junctional heart rhythm.  She had been on multiple AV node blocking agents as well as amiodarone.  She had cardioversion a few months ago.  She has been feeling fatigued for about a week but finally sought attention because symptoms reading worse.  She never passed out.  Her rate slowing drugs were stopped.  Her heart rate increased and she eventually went back into atrial fibrillation with rapid ventricular response.  A couple of days after admission, she had some chest discomfort and her troponin was mildly elevated.  This was thought to be due to her known, small vessel coronary artery disease in the setting of a fast heart rate.  Metoprolol was restarted It is decided that she would need a pacemaker after consultation with Dr. Johney Frame.  She was a candidate for a leadless pacemaker and was willing to be part of the study.  This procedure was performed on 03/16/2012.  She tolerated the procedure well.  Her device was checked the next day and was functioning well.  Her heart rates were in the 80s to 90s at rest.  Toprol XL was started.  We decided to hold her amiodarone and diltiazem.  We are not going to pursue trying to get her back into normal sinus rhythm.  She feels well when her heart rate is controlled.  She also had increased LFTs on amiodarone.  Her TSH was also out of normal range.  Will recheck this in a few weeks to see if this normalizes off of amiodarone.  At times, her blood pressure was high.  This is likely due to having her diltiazem stopped.  We added low-dose lisinopril which seemed to keep  her on pressure in a steady range.  This will be prescribed at discharge.  Potassium and creatinine were stable.  She will be followed by [facility] research personnel involved in this pacemaker study.   Discharge Exam: Blood pressure 132/74, pulse 91, temperature 98.5 F (36.9 C), temperature source Oral, resp. rate 19, height 5' (1.524 m), weight 80.9 kg (178 lb 5.6 oz), SpO2 94.00%.   [place]/18 Irregularly irregular rhythm, S1, S2 No wheezing Soft nontender No edema Labs:   Lab Results  Component Value Date   WBC 6.1 03/10/2012   HGB 12.9 03/10/2012   HCT 38.1 03/10/2012   MCV 90.5 03/10/2012   PLT 195 03/10/2012    Recent Labs Lab 03/15/12 0605  03/17/12 0625  NA 140  < > 141  K 4.0  < > 4.0  CL 101  < > 102  CO2 33*  < > 33*  BUN 23  < > 21  CREATININE 1.01  < > 1.00  CALCIUM 8.8  < > 9.0  PROT 5.9*  --   --   BILITOT 0.4  --   --   ALKPHOS 99  --   --   ALT 73*  --   --   AST 34  --   --   GLUCOSE 105*  < > 109*  < > = values in this interval not displayed. Lab Results  Component Value Date   CKTOTAL 70 06/08/2010   CKMB 2.6 06/08/2010   TROPONINI  0.81* 03/14/2012    Lab Results  Component Value Date   CHOL 134 10/03/2010   CHOL 111 06/08/2010   CHOL  Value: 121        ATP III CLASSIFICATION:  <200     mg/dL   Desirable  454-098  mg/dL   Borderline High  >=119    mg/dL   High        1/47/8295   Lab Results  Component Value Date   HDL 62.80 10/03/2010   HDL 53 06/08/2010   HDL 56 07/19/2009   Lab Results  Component Value Date   LDLCALC 58 10/03/2010   LDLCALC  Value: 49        Total Cholesterol/HDL:CHD Risk Coronary Heart Disease Risk Table                     Men   Women  1/2 Average Risk   3.4   3.3  Average Risk       5.0   4.4  2 X Average Risk   9.6   7.1  3 X Average Risk  23.4   11.0        Use the calculated Patient Ratio above and the CHD Risk Table to determine the patient's CHD Risk.        ATP III CLASSIFICATION (LDL):  <100     mg/dL   Optimal  621-308  mg/dL   Near  or Above                    Optimal  130-159  mg/dL   Borderline  657-846  mg/dL   High  >962     mg/dL   Very High 10/09/2839   LDLCALC  Value: 52        Total Cholesterol/HDL:CHD Risk Coronary Heart Disease Risk Table                     Men   Women  1/2 Average Risk   3.4   3.3  Average Risk       5.0   4.4  2 X Average Risk   9.6   7.1  3 X Average Risk  23.4   11.0        Use the calculated Patient Ratio above and the CHD Risk Table to determine the patient's CHD Risk.        ATP III CLASSIFICATION (LDL):  <100     mg/dL   Optimal  324-401  mg/dL   Near or Above                    Optimal  130-159  mg/dL   Borderline  027-253  mg/dL   High  >664     mg/dL   Very High 05/06/4740   Lab Results  Component Value Date   TRIG 66.0 10/03/2010   TRIG 43 06/08/2010   TRIG 63 07/19/2009   Lab Results  Component Value Date   CHOLHDL 2 10/03/2010   CHOLHDL 2.1 06/08/2010   CHOLHDL 2.2 07/19/2009   No results found for this basename: LDLDIRECT      Radiology: No acute cardiopulmonary disease EKG: Atrial fibrillation with controlled ventricular response, left bundle branch block, occasional paced beats  FOLLOW UP PLANS AND APPOINTMENTS  Future Appointments Provider Department Dept Phone   03/31/2012 12:00 PM Hillis Range, MD [facility] Main Office [place]) [phone]   05/26/2012 11:00 AM Marcelle Smiling  [place] [phone]   05/26/2012 11:30 AM Reece Packer, MD  [facility] [phone]       Medication List    STOP taking these medications       diltiazem 240 MG 24 hr capsule  Commonly known as:  CARDIZEM CD     metoprolol 50 MG tablet  Commonly known as:  LOPRESSOR      TAKE these medications       acetaminophen 500 MG tablet  Commonly known as:  TYLENOL  Take 500 mg by mouth every 6 (six) hours as needed. For pain     aspirin EC 81 MG tablet  Take 81 mg by mouth daily.     atorvastatin 80 MG tablet   Commonly known as:  LIPITOR  Take 1 tablet (80 mg total) by mouth daily.     CALTRATE 600 PO  Take 1 tablet by mouth daily.     COQ10 PO  Take 100 mg by mouth daily. Hold while in hospital     furosemide 40 MG tablet  Commonly known as:  LASIX  Take 1 tablet (40 mg total) by mouth daily.     hydrocortisone cream 1 %  Apply 1 application topically daily as needed. For rash     isosorbide mononitrate 60 MG 24 hr tablet  Commonly known as:  IMDUR  Take 60 mg by mouth daily.     lisinopril 5 MG tablet  Commonly known as:  PRINIVIL,ZESTRIL  Take 1 tablet (5 mg total) by mouth daily.     metoprolol succinate 100 MG 24 hr tablet  Commonly known as:  TOPROL-XL  Take 1 tablet (100 mg total) by mouth daily. Take with or immediately following a meal.     multivitamin tablet  Take 1 tablet by mouth daily.     nitroGLYCERIN 0.4 MG SL tablet  Commonly known as:  NITROSTAT  Place 0.4 mg under the tongue every 5 (five) minutes as needed. For chest pain     omega-3 acid ethyl esters 1 G capsule  Commonly known as:  LOVAZA  Take 1 g by mouth daily as needed. Patient states she only takes it when she thinks about it.     potassium chloride SA 20 MEQ tablet  Commonly known as:  K-DUR,KLOR-CON  Take 10 mEq by mouth daily.     vitamin E 400 UNIT capsule  Generic drug:  vitamin E  Take 400 Units by mouth daily.     warfarin 2 MG tablet  Commonly known as:  COUMADIN  Take 2 mg by mouth every evening. Tuesday, Thursday and Sunday half tab. Whole tablet rest of days           Follow-up Information   Follow up with Hillis Range, MD. (2/26)    Contact information:   [address], [address] [place] [address] [phone]       Follow up with Corky Crafts., MD. (Office will call for coumadin check)    Contact information:   [address] [place] [address] [phone]       BRING ALL MEDICATIONS WITH YOU TO FOLLOW UP APPOINTMENTS  Time  spent with patient to include physician time: 25 minutes Signed: Shameca Landen S. 03/17/2012, 8:35 AM

## 2012-03-17 NOTE — Progress Notes (Addendum)
   ELECTROPHYSIOLOGY ROUNDING NOTE    Patient Name: Patty [name] Date of Encounter: 03-17-2012    SUBJECTIVE:Patient feels well.  No chest pain or shortness of breath.  Status post leadless cardiac pacemaker implant 03-16-2012 for tachy-brady syndrome.   TELEMETRY: Reviewed telemetry pt in atrial fibrillation, rates 90-110's. Physical Exam: Filed Vitals:   03/17/12 0100 03/17/12 0200 03/17/12 0545 03/17/12 0546  BP:   132/74   Pulse:      Temp:    98.5 F (36.9 C)  TempSrc:    Oral  Resp:  19 19   Height:      Weight: 178 lb 5.6 oz (80.9 kg)     SpO2:    94%    GEN- The patient is well appearing, alert and oriented x 3 today.   Head- normocephalic, atraumatic Eyes-  Sclera clear, conjunctiva pink Ears- hearing intact Oropharynx- clear Neck- supple,  Lungs- Clear to ausculation bilaterally, normal work of breathing Heart- Regular rate and rhythm, no murmurs, rubs or gallops, PMI not laterally displaced GI- soft, NT, ND, + BS Extremities- no clubbing, cyanosis, or edema MS- no significant deformity or atrophy Skin- no rash or lesion Psych- euthymic mood, full affect Neuro- strength and sensation are intact   LABS: Basic Metabolic Panel:  Recent Labs  62/95/28 0446 03/17/12 0625  NA 142 141  K 3.9 4.0  CL 103 102  CO2 32 33*  GLUCOSE 103* 109*  BUN 21 21  CREATININE 0.98 1.00  CALCIUM 8.9 9.0   Liver Function Tests:  Recent Labs  03/15/12 0605  AST 34  ALT 73*  ALKPHOS 99  BILITOT 0.4  PROT 5.9*  ALBUMIN 2.6*   Cardiac Enzymes:  Recent Labs  03/14/12 0839 03/14/12 1415 03/14/12 2016  TROPONINI <0.30 0.57* 0.81*    INR: 1.34  Radiology/Studies:  Final result pending.  LCP in stable position.  DEVICE INTERROGATION: Device interrogation reviewed and normal (see paper chart)   Limited echo this am reveals no pericardial effusion.  1. Tachy/brady syndrome Doing well s/p VVI leadless pacemaker implant Will now change metoprolol to  Toprol XL 100mg  daily Resume coumadin  Wound care, restrictions (no driving, stairs, heavy lifting, exertional activity for 3-4 days) reviewed with patient.  Follow up scheduled per research protocol.    Could go home this am from an EP standpoint.  I will see in 2 weeks in the office for device check.  Fayrene Fearing Vesna Kable,MD

## 2012-03-17 NOTE — Research (Signed)
Leadless II Informed Consent   Subject Name: Patty [name]  Subject met inclusion and exclusion criteria.  The informed consent form, study requirements and expectations were reviewed with the subject and questions and concerns were addressed prior to the signing of the consent form.  The subject verbalized understanding of the trail requirements.  The subject agreed to participate in the [place] trial and signed the informed consent.  The informed consent was obtained prior to performance of any protocol-specific procedures for the subject.  A copy of the signed informed consent was given to the subject and a copy was placed in the subject's medical record.  Ines Bloomer Lord 03/12/2012 @ 1530

## 2012-03-23 ENCOUNTER — Telehealth: Payer: Self-pay | Admitting: Internal Medicine

## 2012-03-23 DIAGNOSIS — I10 Essential (primary) hypertension: Secondary | ICD-10-CM | POA: Diagnosis not present

## 2012-03-23 DIAGNOSIS — Z7901 Long term (current) use of anticoagulants: Secondary | ICD-10-CM | POA: Diagnosis not present

## 2012-03-23 DIAGNOSIS — I4891 Unspecified atrial fibrillation: Secondary | ICD-10-CM | POA: Diagnosis not present

## 2012-03-23 DIAGNOSIS — I2510 Atherosclerotic heart disease of native coronary artery without angina pectoris: Secondary | ICD-10-CM | POA: Diagnosis not present

## 2012-03-23 MED ORDER — NITROGLYCERIN 0.4 MG SL SUBL: 0.4000 mg | SUBLINGUAL_TABLET | SUBLINGUAL | Status: DC | PRN

## 2012-03-23 NOTE — Telephone Encounter (Signed)
Called patient and she if great now.  Describes pain last night as a "tightness", with some SOB and sweating.  She was in the kitchen cooking and the phone was "ringing off the hook".  She says she had a lot going on.  She stopped and sat down for 5-10 minutes and it went away.  She did not feel her heart racing at the time.  Her HR was 92 later in the evening and it has been 85 today.  Today she feels good.  I let her know if she had that feeling and felt like her heart was racing she could take an additional Metoprolol.  I  Have also called in her SL NTG.  Dr Johney Frame was aware of her symptoms and spoke with Dr Eldridge Dace who is going to see the patient this afternoon

## 2012-03-23 NOTE — Telephone Encounter (Signed)
New problem    C/O sob & chest pain last night. S/p new pacemaker.

## 2012-03-29 ENCOUNTER — Telehealth: Payer: Self-pay | Admitting: Internal Medicine

## 2012-03-29 NOTE — Telephone Encounter (Signed)
lmom for patient to call me back. 

## 2012-03-29 NOTE — Telephone Encounter (Signed)
lmom for patient to call back 

## 2012-03-29 NOTE — Telephone Encounter (Signed)
New problem    General questions

## 2012-03-31 ENCOUNTER — Ambulatory Visit (INDEPENDENT_AMBULATORY_CARE_PROVIDER_SITE_OTHER): Payer: Medicare Other | Admitting: Internal Medicine

## 2012-03-31 ENCOUNTER — Encounter: Payer: Self-pay | Admitting: Internal Medicine

## 2012-03-31 VITALS — BP 149/83 | HR 86 | Ht 60.0 in | Wt 178.2 lb

## 2012-03-31 DIAGNOSIS — I4891 Unspecified atrial fibrillation: Secondary | ICD-10-CM | POA: Diagnosis not present

## 2012-03-31 DIAGNOSIS — Z7901 Long term (current) use of anticoagulants: Secondary | ICD-10-CM | POA: Diagnosis not present

## 2012-03-31 LAB — PACEMAKER DEVICE OBSERVATION

## 2012-03-31 NOTE — Progress Notes (Signed)
PCP: Darnelle Bos, MD Primary Cardiologist:  Dr Louretta Shorten Patty [name] is a 79 y.o. female who presents today for routine electrophysiology followup.  Since her recent pacemaker implant, the patient reports doing very well.  She is pleased with the outcome of her procedure. Today, she denies symptoms of palpitations, exertional chest pain, shortness of breath,  lower extremity edema, dizziness, presyncope, or syncope.  The patient is otherwise without complaint today.   Past Medical History  Diagnosis Date  . IHD (ischemic heart disease)     Remote MI in 2000 with stent to LAD and LCX. Failed  intervention to OM in 2009. Managed medically since that time.  . MI, old 58  . Hypertension   . Hyperlipidemia   . Tobacco abuse   . Obesity   . Granulosa cell tumor     Has had several surgeries due to her tumor  . Diverticulitis   . Dysrhythmia   . Shortness of breath   . Peripheral vascular disease   . Cancer     abd  . Atrial flutter   . Tachycardia-bradycardia syndrome     leadless pacemaker ([name]) implanted by Dr Johney Frame   Past Surgical History  Procedure Laterality Date  . Cardiac catheterization  09/03/2007    EF 70%; Failed attempt at PCI to OM  . Cardiac catheterization  11/01/2003    EF 70%  . Cholecystectomy    . Parotid mass removed    . US echocardiography  11/21/2003    EF 55-60%  . Cardiovascular stress test  03/23/2009    EF 72%  . Coronary stent placement  2000    LAD and LCX  . Hernia repair  laparoscopic 2005 and open 2007  . Abdominal hysterectomy    . Small intestine surgery    . Colon surgery  2004    colectomy for diverticulosis  . Granulosa cell tumor excision 2004, 2007, 2011    . Coronary angioplasty    . Diagnostic laparoscopy      gallbladder removal; abdominal hernia repair  . Tee without cardioversion  12/31/2011    Procedure: TRANSESOPHAGEAL ECHOCARDIOGRAM (TEE);  Surgeon: Corky Crafts, MD;  Location: [facility] ENDOSCOPY;   Service: Cardiovascular;  Laterality: N/A;  . Cardioversion  12/31/2011    Procedure: CARDIOVERSION;  Surgeon: Corky Crafts, MD;  Location: [facility] ENDOSCOPY;  Service: Cardiovascular;  Laterality: N/A;  . Pacemaker insertion  2/11/4    [name] (SJM) leadless pacemaker (LEADLESS II STUDY PATEINT)    Current Outpatient Prescriptions  Medication Sig Dispense Refill  . acetaminophen (TYLENOL) 500 MG tablet Take 500 mg by mouth every 6 (six) hours as needed. For pain      . aspirin EC 81 MG tablet Take 81 mg by mouth daily.      Marland Kitchen atorvastatin (LIPITOR) 80 MG tablet Take 1 tablet (80 mg total) by mouth daily.  30 tablet  12  . Calcium Carbonate (CALTRATE 600 PO) Take 1 tablet by mouth daily.       . Coenzyme Q10 (COQ10 PO) Take 100 mg by mouth daily. Hold while in hospital      . furosemide (LASIX) 40 MG tablet Take 1 tablet (40 mg total) by mouth daily.  30 tablet  3  . isosorbide mononitrate (IMDUR) 60 MG 24 hr tablet Take 60 mg by mouth daily.        Marland Kitchen lisinopril (PRINIVIL,ZESTRIL) 5 MG tablet Take 1 tablet (5 mg total) by mouth daily.  30 tablet  11  . metoprolol succinate (TOPROL-XL) 50 MG 24 hr tablet Take 50 mg by mouth 2 (two) times daily. Take one and a half tablet in the morning and one and a half a tablet in the evening.  Take with or immediately following a meal.      . Multiple Vitamin (MULTIVITAMIN) tablet Take 1 tablet by mouth daily.        . nitroGLYCERIN (NITROSTAT) 0.4 MG SL tablet Place 1 tablet (0.4 mg total) under the tongue every 5 (five) minutes as needed. For chest pain  35 tablet  11  . omega-3 acid ethyl esters (LOVAZA) 1 G capsule Take 1 g by mouth daily as needed. Patient states she only takes it when she thinks about it.      . potassium chloride SA (K-DUR,KLOR-CON) 20 MEQ tablet Take 10 mEq by mouth daily.      . vitamin Patty (VITAMIN Patty) 400 UNIT capsule Take 400 Units by mouth daily.        Marland Kitchen warfarin (COUMADIN) 2 MG tablet Take 2 mg by mouth every evening.  Tuesday, Thursday and Sunday half tab. Whole tablet rest of days      . hydrocortisone cream 1 % Apply 1 application topically daily as needed. For rash       No current facility-administered medications for this visit.    Physical Exam: Filed Vitals:   03/31/12 1229  BP: 149/83  Pulse: 86  Height: 5' (1.524 m)  Weight: 178 lb 3.2 oz (80.831 kg)    GEN- The patient is well appearing, alert and oriented x 3 today.   Head- normocephalic, atraumatic Eyes-  Sclera clear, conjunctiva pink Ears- hearing intact Oropharynx- clear Lungs- Clear to ausculation bilaterally, normal work of breathing Heart- Regular rate and rhythm, no murmurs, rubs or gallops, PMI not laterally displaced GI- soft, NT, ND, + BS Extremities- no clubbing, cyanosis, or edema  Pacemaker interrogation- reviewed in detail today,  See PACEART report  ekg today reveals afib, V rate 95 bpm, IVCD  Assessment and Plan:  1. Tachycardia/ Bradycardia Normal pacemaker function See Pace Art report No changes today  2. afib Continue rate control and anticoagulation long term

## 2012-03-31 NOTE — Telephone Encounter (Signed)
Patient was seen in the office today

## 2012-04-01 ENCOUNTER — Telehealth: Payer: Self-pay | Admitting: Internal Medicine

## 2012-04-01 NOTE — Telephone Encounter (Signed)
pt metoprolol 50mg  1 1/2 in the am, and 1 1/2 in the pm wanted to let you know what she was taking, also ahs a few questions pls call [phone] after 230pm

## 2012-04-01 NOTE — Telephone Encounter (Signed)
Follow-up:    Patient called in returning a call from [phone].  Please call back after 2:30pm.

## 2012-04-01 NOTE — Telephone Encounter (Signed)
Follow-up:    Patient returned your call.  Please call back. 

## 2012-04-01 NOTE — Telephone Encounter (Signed)
lmom for pt to call me back

## 2012-04-01 NOTE — Telephone Encounter (Signed)
Copy of Dr Jenel Lucks note to be sent to Dr Eldridge Dace

## 2012-04-06 ENCOUNTER — Other Ambulatory Visit: Payer: Self-pay

## 2012-04-06 DIAGNOSIS — Z1231 Encounter for screening mammogram for malignant neoplasm of breast: Secondary | ICD-10-CM

## 2012-04-14 ENCOUNTER — Telehealth: Payer: Self-pay | Admitting: Internal Medicine

## 2012-04-14 MED ORDER — LISINOPRIL 10 MG PO TABS: 10.0000 mg | ORAL_TABLET | Freq: Every day | ORAL | Status: DC

## 2012-04-14 NOTE — Telephone Encounter (Signed)
Spoke to patient she stated for the past 3 mornings she has had chest tightness and sob.States chest tightness has been relieved by NTG x 1.Also B/P has been elevated averaging 145 to 155 systolic and 100 to 110 diastolic,pulse 80 beats/min.No chest tightness at present. States when she first gets up in mornings she is sluggish,chest tightness,sob.Patient was told Dr.Allred not in office today will check with DOD Dr.McAlhany and call her back.

## 2012-04-14 NOTE — Telephone Encounter (Signed)
Spoke to DOD Dr.McAlhany he advised to increase Lisinopril to 10 mg daily.Will forward message to Dr.Allred for advice on chest tightness patient has been having for the past 3 days.

## 2012-04-14 NOTE — Telephone Encounter (Signed)
New Problem:    Patient called in wanting to consult with someone because she is experiencing an issue with possibly the upper two chambers of her heart.  Please call back.

## 2012-04-15 LAB — PACEMAKER DEVICE OBSERVATION
BATTERY VOLTAGE: 3.3 v
BRDY-0002RV: 40 {beats}/min
DEVICE MODEL PM: 1096
RV LEAD AMPLITUDE: 8 mv
RV LEAD IMPEDENCE PM: 400 Ohm
RV LEAD THRESHOLD: 0.5 v

## 2012-04-15 MED ORDER — METOPROLOL TARTRATE 100 MG PO TABS: 100.0000 mg | ORAL_TABLET | Freq: Two times a day (BID) | ORAL | Status: DC

## 2012-04-15 NOTE — Telephone Encounter (Signed)
Dr Johney Frame spoke with Dr Nechama Guard and his office is going to call her and schedule her for OV.  Called patient back and she is aware.  She has an appointment on 04/20/12, and increase the Metoprolol to  100mg  bid take an additional 40mg  of furosemide for 3 days and then back to original dose.  She was appreciative of follow up

## 2012-04-20 DIAGNOSIS — Z79899 Other long term (current) drug therapy: Secondary | ICD-10-CM | POA: Diagnosis not present

## 2012-04-20 DIAGNOSIS — I10 Essential (primary) hypertension: Secondary | ICD-10-CM | POA: Diagnosis not present

## 2012-04-20 DIAGNOSIS — I495 Sick sinus syndrome: Secondary | ICD-10-CM | POA: Diagnosis not present

## 2012-04-20 DIAGNOSIS — I4891 Unspecified atrial fibrillation: Secondary | ICD-10-CM | POA: Diagnosis not present

## 2012-04-20 DIAGNOSIS — Z7901 Long term (current) use of anticoagulants: Secondary | ICD-10-CM | POA: Diagnosis not present

## 2012-04-20 DIAGNOSIS — I2510 Atherosclerotic heart disease of native coronary artery without angina pectoris: Secondary | ICD-10-CM | POA: Diagnosis not present

## 2012-04-26 ENCOUNTER — Other Ambulatory Visit: Payer: Self-pay | Admitting: Internal Medicine

## 2012-04-26 ENCOUNTER — Encounter: Payer: Self-pay | Admitting: Internal Medicine

## 2012-04-26 ENCOUNTER — Ambulatory Visit (INDEPENDENT_AMBULATORY_CARE_PROVIDER_SITE_OTHER): Payer: Medicare Other | Admitting: *Deleted

## 2012-04-26 DIAGNOSIS — I4891 Unspecified atrial fibrillation: Secondary | ICD-10-CM

## 2012-04-26 LAB — PACEMAKER DEVICE OBSERVATION
BRDY-0002RV: 40 {beats}/min
DEVICE MODEL PM: 1096
RV LEAD IMPEDENCE PM: 430 Ohm

## 2012-04-26 NOTE — Progress Notes (Signed)
PPM check done by industry for research.

## 2012-05-03 ENCOUNTER — Ambulatory Visit: Payer: Medicare Other

## 2012-05-18 DIAGNOSIS — Z7901 Long term (current) use of anticoagulants: Secondary | ICD-10-CM | POA: Diagnosis not present

## 2012-05-18 DIAGNOSIS — I4891 Unspecified atrial fibrillation: Secondary | ICD-10-CM | POA: Diagnosis not present

## 2012-05-20 ENCOUNTER — Ambulatory Visit
Admission: RE | Admit: 2012-05-20 | Discharge: 2012-05-20 | Disposition: A | Discharge status: Home / Self Care | Payer: Medicare Other | Source: Ambulatory Visit

## 2012-05-20 DIAGNOSIS — Z1231 Encounter for screening mammogram for malignant neoplasm of breast: Secondary | ICD-10-CM | POA: Diagnosis not present

## 2012-05-26 ENCOUNTER — Encounter: Payer: Self-pay | Admitting: Oncology

## 2012-05-26 ENCOUNTER — Ambulatory Visit (HOSPITAL_BASED_OUTPATIENT_CLINIC_OR_DEPARTMENT_OTHER): Payer: Medicare Other | Admitting: Oncology

## 2012-05-26 ENCOUNTER — Telehealth: Payer: Self-pay | Admitting: Oncology

## 2012-05-26 ENCOUNTER — Other Ambulatory Visit (HOSPITAL_BASED_OUTPATIENT_CLINIC_OR_DEPARTMENT_OTHER): Payer: Medicare Other | Admitting: Lab

## 2012-05-26 DIAGNOSIS — D3910 Neoplasm of uncertain behavior of unspecified ovary: Secondary | ICD-10-CM

## 2012-05-26 LAB — COMPREHENSIVE METABOLIC PANEL (CC13)
Albumin: 3 g/dL — ABNORMAL LOW (ref 3.5–5.0)
Alkaline Phosphatase: 145 U/L (ref 40–150)
BUN: 26.6 mg/dL — ABNORMAL HIGH (ref 7.0–26.0)
CO2: 31 mEq/L — ABNORMAL HIGH (ref 22–29)
Calcium: 8.7 mg/dL (ref 8.4–10.4)
Chloride: 105 mEq/L (ref 98–107)
Glucose: 89 mg/dl (ref 70–99)
Potassium: 3.6 mEq/L (ref 3.5–5.1)
Sodium: 145 mEq/L (ref 136–145)
Total Protein: 6.3 g/dL — ABNORMAL LOW (ref 6.4–8.3)

## 2012-05-26 LAB — CBC WITH DIFFERENTIAL/PLATELET
Basophils Absolute: 0 10*3/uL (ref 0.0–0.1)
Eosinophils Absolute: 0.2 10*3/uL (ref 0.0–0.5)
HGB: 13.2 g/dL (ref 11.6–15.9)
MCV: 93.4 fL (ref 79.5–101.0)
MONO#: 0.5 10*3/uL (ref 0.1–0.9)
NEUT#: 4.6 10*3/uL (ref 1.5–6.5)
RBC: 4.39 10*6/uL (ref 3.70–5.45)
RDW: 15.6 % — ABNORMAL HIGH (ref 11.2–14.5)
WBC: 6.8 10*3/uL (ref 3.9–10.3)
lymph#: 1.5 10*3/uL (ref 0.9–3.3)

## 2012-05-26 NOTE — Progress Notes (Signed)
OFFICE PROGRESS NOTE   05/26/2012   Physicians: J.Osborne, J.Varanasi, J.Allred, B.Hoxworth  INTERVAL HISTORY:   Patient is seen, alone for visit, in scheduled 6 month follow up of granulosa cell tumor of ovary. She has had no symptoms suggesting problems from this diagnosis, but has had cardiac problems and procedures since she was here last. She is on coumadin for cardiac indications, monitored by [facility] cardiology.   The granulosa cell tumor was initially diagnosed at [facility] in 2000, with recurrences in 2004, 2007 and spring 2011. The original operative note from 2000 and that pathology did not transfer into present EMR and I do not know laterality of the original tumor. The surgery in 2004 included resection of the tumor with small bowel resection and colectomy for diverticulosis.  She had laparoscopic repair of ventral hernia in 2005. In 2007 she had resection of pelvic recurrence of tumor plus open repair of ventral hernia with mesh. Most recent intervention was laparoscopic resection of suprapubic involvement by Dr.Hoxworth in May 2011; she had a small and asymptomatic suprapubic hernia in 2011 which has been followed. All of her disease has been very indolent and has generally been detected by CT scans. Last CT AP was April 2013; [name], self-limited episode of bowel obstruction in May 2012. Last note from Dr Johna Sheriff was fall 2013; she may have missed next visit with him because of the cardiac problems.  Patient was hospitalized in Nov 2013 with atrial fibrillation with RVR, attempts at cardioversion unsuccessful. She was admitted again 2-5 thru 03-20-12 with symptomatic tachy brady syndrome, with leadless pacer implantation by Dr Johney Frame on 03-16-12 (per patient, hers was the second [place] procedure).   Patient is feeling much better overall, tho energy is still less than previous and she feels especially weak with  some SOB when she initially gets up in AMs daily. She has visit with Dr Earl Gala on 05-27-12 and is to see Dr Eldridge Dace next week, so will be sure they are aware. By midday until evening, she does not notice the SOB or weakness. She denies chest pain, cough, fever or symptoms of infection, abdominal or pelvic discomfort. Bowels are moving regularly. She voids frequently, now on lasix 40 mg daily. Remainder of 10 point Review of Systems negative.  Objective:  Vital signs in last 24 hours:  BP 126/57  Pulse 78  Temp(Src) 97.8 F (36.6 C) (Oral)  Resp 20  Ht 5' (1.524 m)  Wt 178 lb 3.2 oz (80.831 kg)  BMI 34.8 kg/m2  Alert, excellent historian, looks comfortable and is easily mobile in exam room. Respirations not labored RA.  HEENT:PERRLA, not icteric. Oral mucosa moist and clear. Neck supple without JVD LymphaticsCervical, supraclavicular, and axillary nodes normal. Resp: clear to auscultation bilaterally and normal percussion bilaterally Cardio: slightly irregular RR GI: soft, non-tender; bowel sounds normal; no masses,  no organomegaly appreciated. Multiple surgical scars stable. Extremities: extremities normal, atraumatic, no cyanosis or edema Neuro:nonfocal Skin without rash or ecchymosis  Lab Results:  Results for orders placed in visit on 05/26/12  CBC WITH DIFFERENTIAL      Result Value Range   WBC 6.8  3.9 - 10.3 10e3/uL   NEUT# 4.6  1.5 - 6.5 10e3/uL   HGB 13.2  11.6 - 15.9 g/dL   HCT 16.1  09.6 - 04.5 %   Platelets 204  145 - 400 10e3/uL   MCV 93.4  79.5 -  101.0 fL   MCH 30.1  25.1 - 34.0 pg   MCHC 32.2  31.5 - 36.0 g/dL   RBC 1.61  0.96 - 0.45 10e6/uL   RDW 15.6 (*) 11.2 - 14.5 %   lymph# 1.5  0.9 - 3.3 10e3/uL   MONO# 0.5  0.1 - 0.9 10e3/uL   Eosinophils Absolute 0.2  0.0 - 0.5 10e3/uL   Basophils Absolute 0.0  0.0 - 0.1 10e3/uL   NEUT% 67.6  38.4 - 76.8 %   LYMPH% 21.5  14.0 - 49.7 %   MONO% 8.0  0.0 - 14.0 %   EOS% 2.5  0.0 - 7.0 %   BASO% 0.4  0.0 - 2.0 %   COMPREHENSIVE METABOLIC PANEL (CC13)      Result Value Range   Sodium 145  136 - 145 mEq/L   Potassium 3.6  3.5 - 5.1 mEq/L   Chloride 105  98 - 107 mEq/L   CO2 31 (*) 22 - 29 mEq/L   Glucose 89  70 - 99 mg/dl   BUN 40.9 (*) 7.0 - 81.1 mg/dL   Creatinine 1.1  0.6 - 1.1 mg/dL   Total Bilirubin 9.14  0.20 - 1.20 mg/dL   Alkaline Phosphatase 145  40 - 150 U/L   AST 137 (*) 5 - 34 U/L   ALT 191 (*) 0 - 55 U/L   Total Protein 6.3 (*) 6.4 - 8.3 g/dL   Albumin 3.0 (*) 3.5 - 5.0 g/dL   Calcium 8.7  8.4 - 78.2 mg/dL   Chemistries available after visit. Will send information to Dr Earl Gala as he sees her 05-27-12.   Studies/Results:  DIGITAL BILATERAL SCREENING MAMMOGRAM WITH CAD 05-21-[facility] Comparison: Previous exams.  FINDINGS:  ACR Breast Density Category 2: There is a scattered fibroglandular  pattern.  No suspicious masses, architectural distortion, or calcifications  are present    Reports of CT angio chest 12-27-12 and CXRs in Feb 2014 do not have any suspicion of metastatic disease.  Medications: I have reviewed the patient's current medications. She states she was intolerant to amiodarone and another cardiac med.  Assessment/Plan:  1.granulosa cell tumor of ovary: recurrences in local region since 2004 treated surgically, very indolent. Has not had any systemic treatment. As she has no symptoms now and with all of recent cardiac problems, we will hold off on additional scans now 2.atrial fibrillation and tachy brady syndrome: leadless pacer placed in Feb 2014. Still under close attention of cardiologists 3.coumadin for cardiac indications, monitored by cardiology 4.elevated ASt and ALT ? related to medications or procedures. Information to be shared with other MDs.   I will see her again in 6 months or sooner if needed. I have not set up scans now.  Reece Packer, MD   05/26/2012, 3:38 PM

## 2012-05-27 ENCOUNTER — Other Ambulatory Visit: Payer: Self-pay | Admitting: Internal Medicine

## 2012-05-27 ENCOUNTER — Telehealth: Payer: Self-pay | Admitting: *Deleted

## 2012-05-27 DIAGNOSIS — I2510 Atherosclerotic heart disease of native coronary artery without angina pectoris: Secondary | ICD-10-CM | POA: Diagnosis not present

## 2012-05-27 DIAGNOSIS — I252 Old myocardial infarction: Secondary | ICD-10-CM | POA: Diagnosis not present

## 2012-05-27 DIAGNOSIS — R5383 Other fatigue: Secondary | ICD-10-CM | POA: Diagnosis not present

## 2012-05-27 DIAGNOSIS — R7989 Other specified abnormal findings of blood chemistry: Secondary | ICD-10-CM | POA: Diagnosis not present

## 2012-05-27 DIAGNOSIS — R5381 Other malaise: Secondary | ICD-10-CM | POA: Diagnosis not present

## 2012-05-27 DIAGNOSIS — I4891 Unspecified atrial fibrillation: Secondary | ICD-10-CM | POA: Diagnosis not present

## 2012-05-27 DIAGNOSIS — I10 Essential (primary) hypertension: Secondary | ICD-10-CM | POA: Diagnosis not present

## 2012-05-27 DIAGNOSIS — Z0000 Encounter for general adult medical examination without abnormal findings: Secondary | ICD-10-CM | POA: Diagnosis not present

## 2012-05-27 NOTE — Telephone Encounter (Signed)
Message copied by Carola Rhine A on Thu May 27, 2012 10:27 AM ------      Message from: Jama Flavors P      Created: Thu May 27, 2012 10:02 AM       She is to see PCP Dr Theressa Millard today. Please get my note 05-26-12 + CMET and CBC from 4-23 to his office today, as she has some elevations in chemistries that I would like him to see. Please let patient know she has some changes in liver function tests that are likely related to medicines or recent procedures, and that these will be sent to other MDs. ------

## 2012-05-27 NOTE — Telephone Encounter (Signed)
Message copied by Carola Rhine A on Thu May 27, 2012 10:35 AM ------      Message from: Jama Flavors P      Created: Thu May 27, 2012 10:02 AM       She is to see PCP Dr Theressa Millard today. Please get my note 05-26-12 + CMET and CBC from 4-23 to his office today, as she has some elevations in chemistries that I would like him to see. Please let patient know she has some changes in liver function tests that are likely related to medicines or recent procedures, and that these will be sent to other MDs. ------

## 2012-05-27 NOTE — Telephone Encounter (Signed)
Pt notified of results noted below. Is getting ready to go to see Dr Earl Gala this afternoon. She will discuss with him.

## 2012-05-27 NOTE — Telephone Encounter (Signed)
Message copied by Carola Rhine A on Thu May 27, 2012  1:21 PM ------      Message from: Jama Flavors P      Created: Thu May 27, 2012 10:02 AM       She is to see PCP Dr Theressa Millard today. Please get my note 05-26-12 + CMET and CBC from 4-23 to his office today, as she has some elevations in chemistries that I would like him to see. Please let patient know she has some changes in liver function tests that are likely related to medicines or recent procedures, and that these will be sent to other MDs. ------

## 2012-05-27 NOTE — Telephone Encounter (Signed)
Faxed note and labs to Dr Earl Gala at [phone]

## 2012-05-31 DIAGNOSIS — I2510 Atherosclerotic heart disease of native coronary artery without angina pectoris: Secondary | ICD-10-CM | POA: Diagnosis not present

## 2012-05-31 DIAGNOSIS — I4891 Unspecified atrial fibrillation: Secondary | ICD-10-CM | POA: Diagnosis not present

## 2012-05-31 DIAGNOSIS — R0602 Shortness of breath: Secondary | ICD-10-CM | POA: Diagnosis not present

## 2012-05-31 DIAGNOSIS — I10 Essential (primary) hypertension: Secondary | ICD-10-CM | POA: Diagnosis not present

## 2012-06-03 ENCOUNTER — Ambulatory Visit
Admission: RE | Admit: 2012-06-03 | Discharge: 2012-06-03 | Disposition: A | Discharge status: Home / Self Care | Payer: Medicare Other | Source: Ambulatory Visit | Attending: Internal Medicine | Admitting: Internal Medicine

## 2012-06-03 DIAGNOSIS — K7689 Other specified diseases of liver: Secondary | ICD-10-CM | POA: Diagnosis not present

## 2012-06-03 DIAGNOSIS — R7989 Other specified abnormal findings of blood chemistry: Secondary | ICD-10-CM

## 2012-06-16 DIAGNOSIS — Z7901 Long term (current) use of anticoagulants: Secondary | ICD-10-CM | POA: Diagnosis not present

## 2012-06-16 DIAGNOSIS — I4891 Unspecified atrial fibrillation: Secondary | ICD-10-CM | POA: Diagnosis not present

## 2012-06-30 ENCOUNTER — Ambulatory Visit (INDEPENDENT_AMBULATORY_CARE_PROVIDER_SITE_OTHER): Payer: Medicare Other | Admitting: *Deleted

## 2012-06-30 DIAGNOSIS — I4891 Unspecified atrial fibrillation: Secondary | ICD-10-CM

## 2012-06-30 DIAGNOSIS — R0789 Other chest pain: Secondary | ICD-10-CM | POA: Diagnosis not present

## 2012-06-30 DIAGNOSIS — I10 Essential (primary) hypertension: Secondary | ICD-10-CM | POA: Diagnosis not present

## 2012-06-30 DIAGNOSIS — I2510 Atherosclerotic heart disease of native coronary artery without angina pectoris: Secondary | ICD-10-CM | POA: Diagnosis not present

## 2012-06-30 DIAGNOSIS — R0602 Shortness of breath: Secondary | ICD-10-CM | POA: Diagnosis not present

## 2012-06-30 LAB — PACEMAKER DEVICE OBSERVATION
RV LEAD AMPLITUDE: 9.5 mv
RV LEAD THRESHOLD: 0.75 V
VENTRICULAR PACING PM: 12

## 2012-06-30 NOTE — Progress Notes (Signed)
Leadless ppm check in clinic  

## 2012-07-09 DIAGNOSIS — I2510 Atherosclerotic heart disease of native coronary artery without angina pectoris: Secondary | ICD-10-CM | POA: Diagnosis not present

## 2012-07-09 DIAGNOSIS — R079 Chest pain, unspecified: Secondary | ICD-10-CM | POA: Diagnosis not present

## 2012-07-09 DIAGNOSIS — I10 Essential (primary) hypertension: Secondary | ICD-10-CM | POA: Diagnosis not present

## 2012-07-09 DIAGNOSIS — R0602 Shortness of breath: Secondary | ICD-10-CM | POA: Diagnosis not present

## 2012-07-14 DIAGNOSIS — Z79899 Other long term (current) drug therapy: Secondary | ICD-10-CM | POA: Diagnosis not present

## 2012-07-15 DIAGNOSIS — Z7901 Long term (current) use of anticoagulants: Secondary | ICD-10-CM | POA: Diagnosis not present

## 2012-07-15 DIAGNOSIS — G4733 Obstructive sleep apnea (adult) (pediatric): Secondary | ICD-10-CM | POA: Diagnosis not present

## 2012-07-15 DIAGNOSIS — I4891 Unspecified atrial fibrillation: Secondary | ICD-10-CM | POA: Diagnosis not present

## 2012-07-21 ENCOUNTER — Encounter: Payer: Self-pay | Admitting: Internal Medicine

## 2012-07-30 DIAGNOSIS — I4891 Unspecified atrial fibrillation: Secondary | ICD-10-CM | POA: Diagnosis not present

## 2012-07-30 DIAGNOSIS — R079 Chest pain, unspecified: Secondary | ICD-10-CM | POA: Diagnosis not present

## 2012-07-30 DIAGNOSIS — I2510 Atherosclerotic heart disease of native coronary artery without angina pectoris: Secondary | ICD-10-CM | POA: Diagnosis not present

## 2012-08-02 ENCOUNTER — Other Ambulatory Visit: Payer: Self-pay | Admitting: Interventional Cardiology

## 2012-08-03 DIAGNOSIS — R079 Chest pain, unspecified: Secondary | ICD-10-CM | POA: Diagnosis not present

## 2012-08-04 ENCOUNTER — Other Ambulatory Visit: Payer: Self-pay

## 2012-08-04 ENCOUNTER — Encounter (HOSPITAL_BASED_OUTPATIENT_CLINIC_OR_DEPARTMENT_OTHER)
Admission: RE | Disposition: A | Discharge status: Hospice | Payer: Self-pay | Source: Ambulatory Visit | Attending: Interventional Cardiology

## 2012-08-04 ENCOUNTER — Ambulatory Visit (HOSPITAL_COMMUNITY): Admit: 2012-08-04 | Payer: Self-pay | Admitting: Interventional Cardiology

## 2012-08-04 ENCOUNTER — Ambulatory Visit (HOSPITAL_COMMUNITY)
Admission: RE | Admit: 2012-08-04 | Discharge: 2012-08-06 | Disposition: A | Discharge status: Home / Self Care | Payer: Medicare Other | Source: Ambulatory Visit | Attending: Interventional Cardiology | Admitting: Interventional Cardiology

## 2012-08-04 ENCOUNTER — Encounter (HOSPITAL_COMMUNITY)
Admission: RE | Disposition: A | Discharge status: Home / Self Care | Payer: Self-pay | Source: Ambulatory Visit | Attending: Interventional Cardiology

## 2012-08-04 ENCOUNTER — Inpatient Hospital Stay (HOSPITAL_BASED_OUTPATIENT_CLINIC_OR_DEPARTMENT_OTHER)
Admission: RE | Admit: 2012-08-04 | Discharge: 2012-08-04 | Disposition: A | Discharge status: Hospice | Payer: Medicare Other | Source: Ambulatory Visit | Attending: Interventional Cardiology | Admitting: Interventional Cardiology

## 2012-08-04 ENCOUNTER — Encounter (HOSPITAL_COMMUNITY): Payer: Self-pay | Admitting: Interventional Cardiology

## 2012-08-04 DIAGNOSIS — I4891 Unspecified atrial fibrillation: Secondary | ICD-10-CM | POA: Insufficient documentation

## 2012-08-04 DIAGNOSIS — I5030 Unspecified diastolic (congestive) heart failure: Secondary | ICD-10-CM | POA: Insufficient documentation

## 2012-08-04 DIAGNOSIS — Z79899 Other long term (current) drug therapy: Secondary | ICD-10-CM | POA: Diagnosis not present

## 2012-08-04 DIAGNOSIS — I2582 Chronic total occlusion of coronary artery: Secondary | ICD-10-CM | POA: Insufficient documentation

## 2012-08-04 DIAGNOSIS — I2510 Atherosclerotic heart disease of native coronary artery without angina pectoris: Secondary | ICD-10-CM | POA: Diagnosis not present

## 2012-08-04 DIAGNOSIS — Z9861 Coronary angioplasty status: Secondary | ICD-10-CM

## 2012-08-04 DIAGNOSIS — I509 Heart failure, unspecified: Secondary | ICD-10-CM | POA: Insufficient documentation

## 2012-08-04 DIAGNOSIS — I209 Angina pectoris, unspecified: Secondary | ICD-10-CM | POA: Insufficient documentation

## 2012-08-04 DIAGNOSIS — I428 Other cardiomyopathies: Secondary | ICD-10-CM | POA: Diagnosis not present

## 2012-08-04 DIAGNOSIS — I5031 Acute diastolic (congestive) heart failure: Secondary | ICD-10-CM

## 2012-08-04 HISTORY — PX: PERCUTANEOUS CORONARY INTERVENTION-BALLOON ONLY: SHX6014

## 2012-08-04 LAB — POCT ACTIVATED CLOTTING TIME: Activated Clotting Time: 135 seconds

## 2012-08-04 SURGERY — JV LEFT HEART CATHETERIZATION WITH CORONARY ANGIOGRAM
Anesthesia: Moderate Sedation

## 2012-08-04 SURGERY — PERCUTANEOUS CORONARY STENT INTERVENTION (PCI-S)

## 2012-08-04 SURGERY — PERCUTANEOUS CORONARY INTERVENTION-BALLOON ONLY

## 2012-08-04 MED ORDER — NITROGLYCERIN 0.2 MG/ML ON CALL CATH LAB
INTRAVENOUS | Status: AC
  Filled 2012-08-04: qty 1

## 2012-08-04 MED ORDER — MIDAZOLAM HCL 2 MG/2ML IJ SOLN
INTRAMUSCULAR | Status: AC
  Filled 2012-08-04: qty 2

## 2012-08-04 MED ORDER — LISINOPRIL 10 MG PO TABS
10.0000 mg | ORAL_TABLET | Freq: Every day | ORAL | Status: DC
  Filled 2012-08-04 (×2): qty 1

## 2012-08-04 MED ORDER — HEPARIN (PORCINE) IN NACL 2-0.9 UNIT/ML-% IJ SOLN
INTRAMUSCULAR | Status: AC
  Filled 2012-08-04: qty 1000

## 2012-08-04 MED ORDER — LIDOCAINE HCL (PF) 1 % IJ SOLN
INTRAMUSCULAR | Status: AC
  Filled 2012-08-04: qty 30

## 2012-08-04 MED ORDER — NITROGLYCERIN 0.4 MG SL SUBL: 0.4000 mg | SUBLINGUAL_TABLET | SUBLINGUAL | Status: DC | PRN

## 2012-08-04 MED ORDER — BIVALIRUDIN 250 MG IV SOLR
INTRAVENOUS | Status: AC
  Filled 2012-08-04: qty 250

## 2012-08-04 MED ORDER — FAMOTIDINE IN NACL 20-0.9 MG/50ML-% IV SOLN
INTRAVENOUS | Status: AC
  Filled 2012-08-04: qty 100

## 2012-08-04 MED ORDER — SODIUM CHLORIDE 0.9 % IV SOLN: 250.0000 mL | INTRAVENOUS | Status: DC | PRN

## 2012-08-04 MED ORDER — TICAGRELOR 90 MG PO TABS
ORAL_TABLET | ORAL | Status: AC
  Filled 2012-08-04: qty 2

## 2012-08-04 MED ORDER — SODIUM CHLORIDE 0.9 % IJ SOLN: 3.0000 mL | INTRAMUSCULAR | Status: DC | PRN

## 2012-08-04 MED ORDER — SODIUM CHLORIDE 0.9 % IJ SOLN: 3.0000 mL | Freq: Two times a day (BID) | INTRAMUSCULAR | Status: DC

## 2012-08-04 MED ORDER — ONDANSETRON HCL 4 MG/2ML IJ SOLN: 4.0000 mg | Freq: Four times a day (QID) | INTRAMUSCULAR | Status: DC | PRN

## 2012-08-04 MED ORDER — POTASSIUM CHLORIDE CRYS ER 20 MEQ PO TBCR
20.0000 meq | EXTENDED_RELEASE_TABLET | Freq: Every day | ORAL | Status: DC
  Administered 2012-08-05: 20 meq via ORAL
  Filled 2012-08-04 (×2): qty 1

## 2012-08-04 MED ORDER — FENTANYL CITRATE 0.05 MG/ML IJ SOLN
INTRAMUSCULAR | Status: AC
  Filled 2012-08-04: qty 2

## 2012-08-04 MED ORDER — METOPROLOL TARTRATE 1 MG/ML IV SOLN: 5.0000 mg | INTRAVENOUS | Status: DC | PRN

## 2012-08-04 MED ORDER — MORPHINE SULFATE 2 MG/ML IJ SOLN: 2.0000 mg | INTRAMUSCULAR | Status: DC | PRN

## 2012-08-04 MED ORDER — ISOSORBIDE MONONITRATE ER 60 MG PO TB24
90.0000 mg | ORAL_TABLET | Freq: Every day | ORAL | Status: DC
  Filled 2012-08-04 (×2): qty 1

## 2012-08-04 MED ORDER — CLOPIDOGREL BISULFATE 75 MG PO TABS
75.0000 mg | ORAL_TABLET | Freq: Every day | ORAL | Status: DC
  Administered 2012-08-05 – 2012-08-06 (×2): 75 mg via ORAL
  Filled 2012-08-04 (×2): qty 1

## 2012-08-04 MED ORDER — METOPROLOL TARTRATE 100 MG PO TABS
100.0000 mg | ORAL_TABLET | Freq: Two times a day (BID) | ORAL | Status: DC
  Administered 2012-08-04 – 2012-08-05 (×3): 100 mg via ORAL
  Filled 2012-08-04 (×5): qty 1

## 2012-08-04 MED ORDER — SODIUM CHLORIDE 0.9 % IV SOLN
INTRAVENOUS | Status: DC
  Administered 2012-08-04: 11:00:00 via INTRAVENOUS

## 2012-08-04 MED ORDER — ATORVASTATIN CALCIUM 80 MG PO TABS
80.0000 mg | ORAL_TABLET | Freq: Every day | ORAL | Status: DC
  Filled 2012-08-04 (×2): qty 1

## 2012-08-04 MED ORDER — ACETAMINOPHEN 325 MG PO TABS: 650.0000 mg | ORAL_TABLET | ORAL | Status: DC | PRN

## 2012-08-04 MED ORDER — FUROSEMIDE 10 MG/ML IJ SOLN
INTRAMUSCULAR | Status: AC
  Filled 2012-08-04: qty 4

## 2012-08-04 MED ORDER — SODIUM CHLORIDE 0.9 % IV SOLN
0.2500 mg/kg/h | INTRAVENOUS | Status: AC
  Administered 2012-08-04: 0.25 mg/kg/h via INTRAVENOUS
  Filled 2012-08-04: qty 250

## 2012-08-04 MED ORDER — ACETAMINOPHEN 500 MG PO TABS
500.0000 mg | ORAL_TABLET | Freq: Four times a day (QID) | ORAL | Status: DC | PRN
  Administered 2012-08-04 – 2012-08-05 (×2): 500 mg via ORAL
  Filled 2012-08-04 (×2): qty 1

## 2012-08-04 NOTE — CV Procedure (Signed)
PROCEDURE:  PCI proximal LAD  INDICATIONS:  Angina  The risks, benefits, and details of the procedure were explained to the patient.  The patient verbalized understanding and wanted to proceed.  Informed written consent was obtained.  PROCEDURE TECHNIQUE:  After Xylocaine anesthesia a 25F sheath was placed in the right femoral artery with a single anterior needle wall stick.   Left coronary angiography was done using a Judkins L4 guide catheter.  Right coronary angiography was done using a Judkins R4 guide catheter.  Left ventriculography was done using a pigtail catheter.    CONTRAST:  Total of 110 cc.  COMPLICATIONS:  None.      ANGIOGRAPHIC DATA:     The left anterior descending artery is heavily calcified proximally.  There is a focal 80% stenosis proximally.  The left circumflex artery is large vessel with a heavily calcified proximal vessel stenosis to 80%.    PCI NARRATIVE: A CLS 4.0 guiding catheter was used  his left main.  Angiomax was used for anticoagulation.  An ACT was used to check that the Angiomax is therapeutic.  A 2.5 x 6 cutting balloon was advanced but would not cross the lesion in the proximal LAD.  We switched to a 2.0 x 6 cutting balloon which did cross the lesion and was inflated to 14 atmospheres.  A 2.5 x 8 emerge balloon was dilated to 12 atmospheres but this balloon burst.  A 2.5 x 8 Promus drug-eluting stent was advanced but would not cross the lesion.  A BMW wire was added for extra support.  The Promus stent would not cross.  We tried to get a guideliner across the area of disease to help get the stent across.  A 2-5 x 9 sprinter balloon was used to help advance the guidewire into place.  There was a waist in the middle of this 2.25 balloon.  The guideliner got held up at the waist as well.   At that point, we decided to stop the procedure.  She will need rotational atherectomy to help treat the calcium in both the circumflex and the  LAD.  IMPRESSIONS:  1. Cutting Balloon angioplasty to the proximal left anterior descending artery with reduction of stenosis to 40%.  There is a high chance of restenosis. 2. 80% proximal left circumflex artery lesion which is heavily calcified.   RECOMMENDATION:   Will watch the patient overnight.  We'll diuresis her given her elevated LVEDP at time of diagnostic catheterization.  We'll bring her back for rotational atherectomy, likely next week.  Continue Plavix and aspirin for a few days.  On Thursday, restart Coumadin.  On Friday, we'll have her stop aspirin.  Continue aggressive secondary prevention.

## 2012-08-04 NOTE — CV Procedure (Signed)
PROCEDURE:  Left heart catheterization with selective coronary angiography, left ventriculogram.   INDICATIONS:  Refractory angina  The risks, benefits, and details of the procedure were explained to the patient.  The patient verbalized understanding and wanted to proceed.  Informed written consent was obtained.  PROCEDURE TECHNIQUE:  After Xylocaine anesthesia a 66F sheath was placed in the right femoral artery with a single anterior needle wall stick.   Left coronary angiography was done using a Judkins L4 guide catheter.  Right coronary angiography was done using a Judkins R4 guide catheter.  Left ventriculography was done using a pigtail catheter.    CONTRAST:  Total of  120 cc.  COMPLICATIONS:  None.    HEMODYNAMICS:  Aortic pressure was 135/67; LV pressure was 137/28; LVEDP 30.  There was no gradient between the left ventricle and aorta.    ANGIOGRAPHIC DATA:   The left main coronary artery is patent.  The left anterior descending artery is moderately diseased proximally.  There is a focal 80% stenosis at the first septal perforator.  The stent in the mid LAD is patent.  There is mild diffuse disease throughout the remainder of the LAD.  There are a few small diagonal vessels which are patent..  The left circumflex artery is a large vessel.  The OM1 is occluded and fills by left to left collaterals.  The OM 2 is a medium-sized vessel with moderate ostial disease.  OM 3 is medium sized and patent.  In the proximal circumflex, there is a hazy 80% stenosis.    The right coronary artery is a large dominant vessel.  There is moderate diffuse disease without focal stenosis.  The take off required an AR-2 catheter to engage.  The PDA is fairly small and diffusely diseased.  Posterior lateral artery is large proximally and terminates and multiple branches.Marland Kitchen  LEFT VENTRICULOGRAM:  Left ventricular angiogram was done in the 30 RAO projection and revealed normal left ventricular wall motion and  systolic function with an estimated ejection fraction of 30 %.  LVEDP was 30 mmHg.  IMPRESSIONS:  1. Patent left main coronary artery. 2. Significant disease in the proximal left anterior descending artery.  Patent mid vessel stents.. 3. Significant proximal left circumflex artery stenosis.  Chronic total occlusion of the OM1 which fills by collaterals.. 4. Moderate diffuse disease in the right coronary artery. 5. Moderate to severe left ventricular systolic dysfunction.  LVEDP 30 mmHg.  Ejection fraction 30%.  RECOMMENDATION:  Plan for PCI of the proximal LAD and the proximal circumflex.  Of note, she does not have any plans for upcoming surgery.  We'll plan for drug-eluting stents.  She will likely be on Coumadin and aspirin for 6 months.  At that point, we'll readdress anticoagulation.  We'll also intensify therapy for CHF.  We'll try to diuresis the patient while she is in the hospital.

## 2012-08-04 NOTE — Progress Notes (Signed)
Site area: right groin  Site Prior to Removal:  Level 0  Pressure Applied For 20  MINUTES    Minutes Beginning at 20:15  Manual:   yes  Patient Status During Pull:  AxOx3 stable  Post Pull Groin Site:  Level 0  Post Pull Instructions Given:  yes  Post Pull Pulses Present:  yes  Dressing Applied:  yes  Comments:  Pt tolerated removal of sheath from right groin without complication, VSS, will continue to monitor patient.

## 2012-08-05 DIAGNOSIS — I209 Angina pectoris, unspecified: Secondary | ICD-10-CM | POA: Diagnosis not present

## 2012-08-05 DIAGNOSIS — I2510 Atherosclerotic heart disease of native coronary artery without angina pectoris: Secondary | ICD-10-CM | POA: Diagnosis not present

## 2012-08-05 DIAGNOSIS — I509 Heart failure, unspecified: Secondary | ICD-10-CM | POA: Diagnosis not present

## 2012-08-05 DIAGNOSIS — Z79899 Other long term (current) drug therapy: Secondary | ICD-10-CM | POA: Diagnosis not present

## 2012-08-05 DIAGNOSIS — I4891 Unspecified atrial fibrillation: Secondary | ICD-10-CM | POA: Diagnosis not present

## 2012-08-05 DIAGNOSIS — I428 Other cardiomyopathies: Secondary | ICD-10-CM | POA: Diagnosis not present

## 2012-08-05 LAB — CBC
Hemoglobin: 12.7 g/dL (ref 12.0–15.0)
MCH: 30.1 pg (ref 26.0–34.0)
MCHC: 33.1 g/dL (ref 30.0–36.0)

## 2012-08-05 LAB — BASIC METABOLIC PANEL
BUN: 22 mg/dL (ref 6–23)
Calcium: 8.6 mg/dL (ref 8.4–10.5)
GFR calc non Af Amer: 54 mL/min — ABNORMAL LOW (ref >90)
Glucose, Bld: 105 mg/dL — ABNORMAL HIGH (ref 70–99)
Sodium: 141 mEq/L (ref 135–145)

## 2012-08-05 MED ORDER — DILTIAZEM HCL ER COATED BEADS 120 MG PO CP24
120.0000 mg | ORAL_CAPSULE | Freq: Every day | ORAL | Status: DC
  Administered 2012-08-05: 120 mg via ORAL
  Filled 2012-08-05 (×2): qty 1

## 2012-08-05 MED ORDER — ISOSORBIDE MONONITRATE ER 30 MG PO TB24
30.0000 mg | ORAL_TABLET | Freq: Every day | ORAL | Status: DC
  Filled 2012-08-05: qty 1

## 2012-08-05 MED ORDER — LISINOPRIL 5 MG PO TABS
5.0000 mg | ORAL_TABLET | Freq: Two times a day (BID) | ORAL | Status: DC
  Administered 2012-08-05: 12:00:00 5 mg via ORAL
  Filled 2012-08-05 (×2): qty 1

## 2012-08-05 MED ORDER — LISINOPRIL 5 MG PO TABS
5.0000 mg | ORAL_TABLET | Freq: Two times a day (BID) | ORAL | Status: DC
  Filled 2012-08-05 (×2): qty 1

## 2012-08-05 MED ORDER — ISOSORBIDE MONONITRATE ER 60 MG PO TB24
60.0000 mg | ORAL_TABLET | Freq: Every day | ORAL | Status: DC
  Administered 2012-08-05: 60 mg via ORAL
  Filled 2012-08-05 (×2): qty 1

## 2012-08-05 MED ORDER — FUROSEMIDE 10 MG/ML IJ SOLN
40.0000 mg | Freq: Once | INTRAMUSCULAR | Status: AC
  Administered 2012-08-05: 40 mg via INTRAVENOUS
  Filled 2012-08-05: qty 4

## 2012-08-05 MED ORDER — ATORVASTATIN CALCIUM 80 MG PO TABS
80.0000 mg | ORAL_TABLET | Freq: Every day | ORAL | Status: DC
  Administered 2012-08-05: 80 mg via ORAL
  Filled 2012-08-05 (×2): qty 1

## 2012-08-05 MED ORDER — POTASSIUM CHLORIDE CRYS ER 20 MEQ PO TBCR
20.0000 meq | EXTENDED_RELEASE_TABLET | Freq: Once | ORAL | Status: AC
  Administered 2012-08-05: 20 meq via ORAL
  Filled 2012-08-05: qty 1

## 2012-08-05 MED ORDER — ISOSORBIDE MONONITRATE ER 30 MG PO TB24
30.0000 mg | ORAL_TABLET | Freq: Every day | ORAL | Status: DC
  Administered 2012-08-05: 30 mg via ORAL
  Filled 2012-08-05 (×2): qty 1

## 2012-08-05 MED FILL — Sodium Chloride IV Soln 0.9%: INTRAVENOUS | Qty: 50 | Status: AC

## 2012-08-05 NOTE — Progress Notes (Signed)
Utilization Review Completed Yamil Dougher J. Aiana Nordquist, RN, BSN, NCM [phone]  

## 2012-08-05 NOTE — Progress Notes (Signed)
SUBJECTIVE:  No SHOB this AM.No groin bleeding. Felt ok with cardiac rehab.  Diuresed with IV lasix again this mmorning due to increased LVEDP  OBJECTIVE:   Vitals:   Filed Vitals:   08/05/12 1430 08/05/12 1432 08/05/12 1440 08/05/12 1450  BP: 79/51 90/55 100/57 93/50  Pulse:      Temp:      TempSrc:      Resp:      Height:      Weight:      SpO2:       I&O's:   Intake/Output Summary (Last 24 hours) at 08/05/12 1727 Last data filed at 08/05/12 1317  Gross per 24 hour  Intake    720 ml  Output   1325 ml  Net   -605 ml   TELEMETRY: Reviewed telemetry pt in Afib, paced     PHYSICAL EXAM General: Well developed, well nourished, in no acute distress Head:    Normal cephalic and atramatic  Lungs: Clear bilaterally to auscultation and percussion. Heart:  Irregularly irregular Abdomen: B abdomen soft and non-tender \ Msk:  Back normal, normal gait. Normal strength and tone for age. Extremities:  No edema. No groin hematoma  DP +1 Neuro: Alert and oriented X 3. Psych:  Good affect, responds appropriately   LABS: Basic Metabolic Panel:  Recent Labs  16/10/96 0900  NA 141  K 4.1  CL 102  CO2 28  GLUCOSE 105*  BUN 22  CREATININE 0.97  CALCIUM 8.6   Liver Function Tests: No results found for this basename: AST, ALT, ALKPHOS, BILITOT, PROT, ALBUMIN,  in the last 72 hours No results found for this basename: LIPASE, AMYLASE,  in the last 72 hours CBC:  Recent Labs  08/05/12 0900  WBC 8.4  HGB 12.7  HCT 38.4  MCV 91.0  PLT 219   Cardiac Enzymes: No results found for this basename: CKTOTAL, CKMB, CKMBINDEX, TROPONINI,  in the last 72 hours BNP: No components found with this basename: POCBNP,  D-Dimer: No results found for this basename: DDIMER,  in the last 72 hours Hemoglobin A1C: No results found for this basename: HGBA1C,  in the last 72 hours Fasting Lipid Panel: No results found for this basename: CHOL, HDL, LDLCALC, TRIG, CHOLHDL, LDLDIRECT,  in the  last 72 hours Thyroid Function Tests: No results found for this basename: TSH, T4TOTAL, FREET3, T3FREE, THYROIDAB,  in the last 72 hours Anemia Panel: No results found for this basename: VITAMINB12, FOLATE, FERRITIN, TIBC, IRON, RETICCTPCT,  in the last 72 hours Coag Panel:   Lab Results  Component Value Date   INR 1.34 03/17/2012   INR 1.76* 03/16/2012   INR 1.99* 03/15/2012    RADIOLOGY: No results found.    ASSESSMENT: CAD, diastolic HF,  PLAN:  Plan for repeat PCI with rotablator next Wednesday.  Additional Lasix this AM due to increased LVEDP  AFib, rate control. No Coumadin since she will need to sstop it for procedure next week.  Possible d/c today, if she is better.  She is a little weak and would prefer to stay till tomorrow. Corky Crafts., MD  08/05/2012  5:27 PM

## 2012-08-05 NOTE — Progress Notes (Signed)
Patient sleeping and woken to take vital signs. Sleeping on right side.1428 Left arm pressure 74/34 and 1429 right arm pressure 79/51. Patient instructed to rest and serial blood pressures taken on right arm. 1440 100/57 and 1450 93/50. Dr. Eldridge Dace notified. Patient ambulated as instructed with a  slow unsteady gait, holding on to rail and complaining of shortness of breath. Increase in respiratory rate and labored breathing with increased expansion of chest noted. Patient's breathing resolving with rest. Patient reported feeling weak after ambulation and shortness of breath concerned her. She voiced that she was not comfortable going home at this time. Blood pressure 116/66 at 1506 after ambulation. Dr. Eldridge Dace notified of patients response to activity and voiced concerns. Medication adjustments made as instructed by Dr. Eldridge Dace and plan of care reviewed with patient.

## 2012-08-05 NOTE — Progress Notes (Signed)
CARDIAC REHAB PHASE I   PRE:  Rate/Rhythm: 102 Afib  BP:  Supine: 156/89  Sitting:   Standing:    SaO2: 97 RA  MODE:  Ambulation: 450 ft   POST:  Rate/Rhythm: 114 Afib  BP:  Supine:   Sitting: 136/114 right arm 130/95 left arm   Standing:    SaO2: 96 RA 0810-0930 Assisted X 1 to ambulate. Pt held to hand rail in hall. Gait steady, slow pace. Pt c/ of some DOE denies any cp. BP up before and after walk. RA sat after walk 96% on RA. Completed PCI and CHF education with pt . She voices understanding. Pt agrees to Outpt. CRP in [place] after second procedure next week.   Melina Copa RN 08/05/2012 9:29 AM

## 2012-08-06 DIAGNOSIS — I428 Other cardiomyopathies: Secondary | ICD-10-CM | POA: Diagnosis not present

## 2012-08-06 DIAGNOSIS — D649 Anemia, unspecified: Secondary | ICD-10-CM | POA: Diagnosis not present

## 2012-08-06 DIAGNOSIS — Z950 Presence of cardiac pacemaker: Secondary | ICD-10-CM | POA: Diagnosis not present

## 2012-08-06 DIAGNOSIS — I509 Heart failure, unspecified: Secondary | ICD-10-CM | POA: Diagnosis not present

## 2012-08-06 DIAGNOSIS — Z79899 Other long term (current) drug therapy: Secondary | ICD-10-CM | POA: Diagnosis not present

## 2012-08-06 DIAGNOSIS — I209 Angina pectoris, unspecified: Secondary | ICD-10-CM | POA: Diagnosis not present

## 2012-08-06 DIAGNOSIS — I4891 Unspecified atrial fibrillation: Secondary | ICD-10-CM | POA: Diagnosis not present

## 2012-08-06 DIAGNOSIS — I2510 Atherosclerotic heart disease of native coronary artery without angina pectoris: Secondary | ICD-10-CM | POA: Diagnosis not present

## 2012-08-06 LAB — BASIC METABOLIC PANEL
BUN: 25 mg/dL — ABNORMAL HIGH (ref 6–23)
CO2: 30 mEq/L (ref 19–32)
Glucose, Bld: 110 mg/dL — ABNORMAL HIGH (ref 70–99)
Potassium: 3.6 mEq/L (ref 3.5–5.1)
Sodium: 143 mEq/L (ref 135–145)

## 2012-08-06 MED ORDER — CLOPIDOGREL BISULFATE 75 MG PO TABS: 75.0000 mg | ORAL_TABLET | Freq: Every day | ORAL | Status: DC

## 2012-08-06 NOTE — Discharge Summary (Signed)
Patient ID: LAVEDA DEMEDEIROS MRN: [id number] DOB/AGE: 77-Jul-1932 77 y.o.  Admit date: 08/04/2012 Discharge date: 08/06/2012  Primary Discharge Diagnosis  Angina Secondary Discharge Diagnosis AFib, Cardiomyopathy, Congestive heart failure , Significant Diagnostic Studies: angiography: Cardiac cath with PTCA of LAD.  Severe disease in the proximal circ  Consults: None  Hospital Course: 59 y/owoman who had persistent angina.  She underwent cardiac cath showing significant , calcified prox LAD and prox circ disease.  We performed cutting balloon angioplasty to the LAD but were unable to deliver a stent due to heavy calcificaiton.  We elected to stop adn perform rotational atherectomy in a week.   She had no further chest pain even with walking.  After cath, she developed [facility].  Her EF was 30-35%.  She was diuresed and felt better.  Due to increased LVEDP, she had more IV lasix the day after the cath.  She felt a little tired and had some hypotension so was kept an additional day. On the morning of d/c, she felt well and BP was controlled.   She will be contacted for instructions for the repeat cath.   Discharge Exam: Blood pressure 123/54, pulse 57, temperature 97.7 F (36.5 C), temperature source Oral, resp. rate 18, height 5' (1.524 m), weight 81.8 kg (180 lb 5.4 oz), SpO2 95.00%.   [place]/AT Irregular, normal rate No wheezing No groin hematoma  Labs:   Lab Results  Component Value Date   WBC 8.4 08/05/2012   HGB 12.7 08/05/2012   HCT 38.4 08/05/2012   MCV 91.0 08/05/2012   PLT 219 08/05/2012    Recent Labs Lab 08/06/12 0515  NA 143  K 3.6  CL 105  CO2 30  BUN 25*  CREATININE 0.99  CALCIUM 8.4  GLUCOSE 110*   Lab Results  Component Value Date   CKTOTAL 70 06/08/2010   CKMB 2.6 06/08/2010   TROPONINI 0.81* 03/14/2012    Lab Results  Component Value Date   CHOL 134 10/03/2010   CHOL 111 06/08/2010   CHOL  Value: 121        ATP III CLASSIFICATION:  <200     mg/dL   Desirable  409-811   mg/dL   Borderline High  >=914    mg/dL   High        7/82/9562   Lab Results  Component Value Date   HDL 62.80 10/03/2010   HDL 53 06/08/2010   HDL 56 07/19/2009   Lab Results  Component Value Date   LDLCALC 58 10/03/2010   LDLCALC  Value: 49        Total Cholesterol/HDL:CHD Risk Coronary Heart Disease Risk Table                     Men   Women  1/2 Average Risk   3.4   3.3  Average Risk       5.0   4.4  2 X Average Risk   9.6   7.1  3 X Average Risk  23.4   11.0        Use the calculated Patient Ratio above and the CHD Risk Table to determine the patient's CHD Risk.        ATP III CLASSIFICATION (LDL):  <100     mg/dL   Optimal  130-865  mg/dL   Near or Above                    Optimal  130-159  mg/dL   Borderline  161-096  mg/dL   High  >045     mg/dL   Very High 4/0/9811   LDLCALC  Value: 52        Total Cholesterol/HDL:CHD Risk Coronary Heart Disease Risk Table                     Men   Women  1/2 Average Risk   3.4   3.3  Average Risk       5.0   4.4  2 X Average Risk   9.6   7.1  3 X Average Risk  23.4   11.0        Use the calculated Patient Ratio above and the CHD Risk Table to determine the patient's CHD Risk.        ATP III CLASSIFICATION (LDL):  <100     mg/dL   Optimal  914-782  mg/dL   Near or Above                    Optimal  130-159  mg/dL   Borderline  956-213  mg/dL   High  >086     mg/dL   Very High 5/78/4696   Lab Results  Component Value Date   TRIG 66.0 10/03/2010   TRIG 43 06/08/2010   TRIG 63 07/19/2009   Lab Results  Component Value Date   CHOLHDL 2 10/03/2010   CHOLHDL 2.1 06/08/2010   CHOLHDL 2.2 07/19/2009   No results found for this basename: LDLDIRECT      Radiology:  EXB:MWUX, LBBB  FOLLOW UP PLANS AND APPOINTMENTS Discharge Orders   Future Appointments Provider Department Dept Phone   09/30/2012 11:00 AM Hillis Range, MD [facility] Main Office [place]) [phone]   09/30/2012 11:00 AM [facility] Rsch Nurse [facility]  Cardiovascular Research  [phone]   11/24/2012 1:30 PM Windell [facility] [phone]   11/24/2012 2:00 PM [facility] Covering Provider [address] [place] [facility] [phone]   Future Orders Complete By Expires     Amb Referral to Cardiac Rehabilitation  As directed         Medication List    STOP taking these medications       warfarin 2 MG tablet  Commonly known as:  COUMADIN      TAKE these medications       atorvastatin 80 MG tablet  Commonly known as:  LIPITOR  Take 80 mg by mouth daily.     CALTRATE 600+D 600-800 MG-UNIT Tabs  Generic drug:  Calcium Carb-Cholecalciferol  Take 1 tablet by mouth daily.     clopidogrel 75 MG tablet  Commonly known as:  PLAVIX  Take 1 tablet (75 mg total) by mouth daily with breakfast.     COQ10 PO  Take 100 mg by mouth daily. Hold while in hospital     diltiazem 120 MG 24 hr capsule  Commonly known as:  DILACOR XR  Take 120 mg by mouth daily.     FISH OIL PO  Take 1 capsule by mouth 3 (three) times a week. Takes fish oil capsule on Monday, Wednesday, and Friday     furosemide 40 MG tablet  Commonly known as:  LASIX  Take 40 mg by mouth daily. Takes a 1/2 additionally when needed     isosorbide mononitrate 60 MG 24 hr tablet  Commonly known as:  IMDUR  Take 90 mg by  mouth daily.     lisinopril 10 MG tablet  Commonly known as:  PRINIVIL,ZESTRIL  Take 5 mg by mouth 2 (two) times daily.     metoprolol 100 MG tablet  Commonly known as:  LOPRESSOR  Take 1 tablet (100 mg total) by mouth 2 (two) times daily.     multivitamin tablet  Take 1 tablet by mouth daily.     nitroGLYCERIN 0.4 MG SL tablet  Commonly known as:  NITROSTAT  Place 1 tablet (0.4 mg total) under the tongue every 5 (five) minutes as needed. For chest pain     potassium chloride SA 20 MEQ tablet  Commonly known as:  K-DUR,KLOR-CON  Take 40 mEq by mouth daily.     vitamin E 400 UNIT capsule  Generic drug:  vitamin  E  Take 400 Units by mouth daily.           Follow-up Information   Follow up with Corky Crafts., MD. (Repeat PCI on 08/11/12)    Contact information:   [address] [place] [address] [phone]       BRING ALL MEDICATIONS WITH YOU TO FOLLOW UP APPOINTMENTS  Time spent with patient to include physician time: 20 minutes Signed: Armistead Sult S. 08/06/2012, 7:57 AM

## 2012-08-08 NOTE — H&P (Signed)
Date of Initial H&P: 07/30/12  History reviewed, patient examined, no change in status, stable for cath.

## 2012-08-09 ENCOUNTER — Other Ambulatory Visit: Payer: Self-pay | Admitting: Interventional Cardiology

## 2012-08-10 ENCOUNTER — Encounter (HOSPITAL_COMMUNITY): Payer: Self-pay | Admitting: Respiratory Therapy

## 2012-08-11 ENCOUNTER — Encounter (HOSPITAL_COMMUNITY): Payer: Self-pay | Admitting: General Practice

## 2012-08-11 ENCOUNTER — Encounter (HOSPITAL_COMMUNITY)
Admission: RE | Disposition: A | Discharge status: Home / Self Care | Payer: Self-pay | Source: Ambulatory Visit | Attending: Interventional Cardiology

## 2012-08-11 ENCOUNTER — Ambulatory Visit (HOSPITAL_COMMUNITY)
Admission: RE | Admit: 2012-08-11 | Discharge: 2012-08-12 | Disposition: A | Discharge status: Home / Self Care | Payer: Medicare Other | Source: Ambulatory Visit | Attending: Interventional Cardiology | Admitting: Interventional Cardiology

## 2012-08-11 DIAGNOSIS — I2510 Atherosclerotic heart disease of native coronary artery without angina pectoris: Secondary | ICD-10-CM | POA: Diagnosis not present

## 2012-08-11 DIAGNOSIS — I4891 Unspecified atrial fibrillation: Secondary | ICD-10-CM

## 2012-08-11 DIAGNOSIS — D649 Anemia, unspecified: Secondary | ICD-10-CM | POA: Diagnosis not present

## 2012-08-11 DIAGNOSIS — I428 Other cardiomyopathies: Secondary | ICD-10-CM | POA: Diagnosis not present

## 2012-08-11 DIAGNOSIS — Z79899 Other long term (current) drug therapy: Secondary | ICD-10-CM | POA: Insufficient documentation

## 2012-08-11 DIAGNOSIS — I209 Angina pectoris, unspecified: Secondary | ICD-10-CM | POA: Diagnosis not present

## 2012-08-11 DIAGNOSIS — I5031 Acute diastolic (congestive) heart failure: Secondary | ICD-10-CM | POA: Diagnosis not present

## 2012-08-11 DIAGNOSIS — Z950 Presence of cardiac pacemaker: Secondary | ICD-10-CM | POA: Insufficient documentation

## 2012-08-11 HISTORY — DX: Malignant neoplasm of unspecified ovary: C56.9

## 2012-08-11 HISTORY — DX: Asymptomatic varicose veins of unspecified lower extremity: I83.90

## 2012-08-11 HISTORY — PX: PERCUTANEOUS CORONARY ROTOBLATOR INTERVENTION (PCI-R): SHX5484

## 2012-08-11 LAB — POCT ACTIVATED CLOTTING TIME: Activated Clotting Time: 375 seconds

## 2012-08-11 SURGERY — PERCUTANEOUS CORONARY ROTOBLATOR INTERVENTION (PCI-R)
Anesthesia: LOCAL

## 2012-08-11 MED ORDER — SODIUM CHLORIDE 0.9 % IV SOLN: 250.0000 mL | INTRAVENOUS | Status: DC | PRN

## 2012-08-11 MED ORDER — ISOSORBIDE MONONITRATE ER 60 MG PO TB24
60.0000 mg | ORAL_TABLET | Freq: Every day | ORAL | Status: DC
  Filled 2012-08-11: qty 1

## 2012-08-11 MED ORDER — POTASSIUM CHLORIDE CRYS ER 20 MEQ PO TBCR
40.0000 meq | EXTENDED_RELEASE_TABLET | Freq: Every day | ORAL | Status: DC
  Filled 2012-08-11: qty 2

## 2012-08-11 MED ORDER — ONDANSETRON HCL 4 MG/2ML IJ SOLN: 4.0000 mg | Freq: Four times a day (QID) | INTRAMUSCULAR | Status: DC | PRN

## 2012-08-11 MED ORDER — ACETAMINOPHEN 325 MG PO TABS: 650.0000 mg | ORAL_TABLET | ORAL | Status: DC | PRN

## 2012-08-11 MED ORDER — CLOPIDOGREL BISULFATE 300 MG PO TABS
ORAL_TABLET | ORAL | Status: AC
  Filled 2012-08-11: qty 1

## 2012-08-11 MED ORDER — CLOPIDOGREL BISULFATE 75 MG PO TABS: 75.0000 mg | ORAL_TABLET | Freq: Every day | ORAL | Status: DC

## 2012-08-11 MED ORDER — ATORVASTATIN CALCIUM 80 MG PO TABS
80.0000 mg | ORAL_TABLET | Freq: Every day | ORAL | Status: DC
  Filled 2012-08-11 (×3): qty 1

## 2012-08-11 MED ORDER — SODIUM CHLORIDE 0.9 % IJ SOLN: 3.0000 mL | Freq: Two times a day (BID) | INTRAMUSCULAR | Status: DC

## 2012-08-11 MED ORDER — DILTIAZEM HCL ER 120 MG PO CP24
120.0000 mg | ORAL_CAPSULE | Freq: Every day | ORAL | Status: DC
  Filled 2012-08-11: qty 1

## 2012-08-11 MED ORDER — BIVALIRUDIN 250 MG IV SOLR
INTRAVENOUS | Status: AC
  Filled 2012-08-11: qty 250

## 2012-08-11 MED ORDER — SODIUM CHLORIDE 0.9 % IV SOLN
INTRAVENOUS | Status: DC
  Administered 2012-08-11: 75 mL/h via INTRAVENOUS

## 2012-08-11 MED ORDER — SODIUM CHLORIDE 0.9 % IJ SOLN: 3.0000 mL | INTRAMUSCULAR | Status: DC | PRN

## 2012-08-11 MED ORDER — VERAPAMIL HCL 2.5 MG/ML IV SOLN
INTRAVENOUS | Status: AC
  Filled 2012-08-11: qty 4

## 2012-08-11 MED ORDER — NITROGLYCERIN 0.4 MG SL SUBL: 0.4000 mg | SUBLINGUAL_TABLET | SUBLINGUAL | Status: DC | PRN

## 2012-08-11 MED ORDER — METOPROLOL TARTRATE 100 MG PO TABS
100.0000 mg | ORAL_TABLET | Freq: Two times a day (BID) | ORAL | Status: DC
  Administered 2012-08-11: 22:00:00 100 mg via ORAL
  Filled 2012-08-11 (×3): qty 1

## 2012-08-11 MED ORDER — SODIUM CHLORIDE 0.9 % IV SOLN
1.0000 mL/kg/h | INTRAVENOUS | Status: AC
  Administered 2012-08-11: 1 mL/kg/h via INTRAVENOUS

## 2012-08-11 MED ORDER — FENTANYL CITRATE 0.05 MG/ML IJ SOLN
INTRAMUSCULAR | Status: AC
  Filled 2012-08-11: qty 2

## 2012-08-11 MED ORDER — ASPIRIN 81 MG PO CHEW: 324.0000 mg | CHEWABLE_TABLET | ORAL | Status: AC

## 2012-08-11 MED ORDER — ASPIRIN 81 MG PO CHEW
CHEWABLE_TABLET | ORAL | Status: AC
  Administered 2012-08-11: 324 mg via ORAL
  Filled 2012-08-11: qty 4

## 2012-08-11 MED ORDER — SODIUM CHLORIDE 0.9 % IV SOLN
1.7500 mg/kg/h | INTRAVENOUS | Status: AC
  Administered 2012-08-11: 1.75 mg/kg/h via INTRAVENOUS
  Filled 2012-08-11: qty 250

## 2012-08-11 MED ORDER — DIAZEPAM 5 MG PO TABS: 5.0000 mg | ORAL_TABLET | ORAL | Status: AC

## 2012-08-11 MED ORDER — ISOSORBIDE MONONITRATE ER 30 MG PO TB24
30.0000 mg | ORAL_TABLET | Freq: Every day | ORAL | Status: DC
  Administered 2012-08-11: 30 mg via ORAL
  Filled 2012-08-11 (×2): qty 1

## 2012-08-11 MED ORDER — DIAZEPAM 5 MG PO TABS
ORAL_TABLET | ORAL | Status: AC
  Administered 2012-08-11: 5 mg via ORAL
  Filled 2012-08-11: qty 1

## 2012-08-11 MED ORDER — LIDOCAINE HCL (PF) 1 % IJ SOLN
INTRAMUSCULAR | Status: AC
  Filled 2012-08-11: qty 30

## 2012-08-11 MED ORDER — CLOPIDOGREL BISULFATE 300 MG PO TABS
300.0000 mg | ORAL_TABLET | Freq: Once | ORAL | Status: AC
  Administered 2012-08-11: 300 mg via ORAL

## 2012-08-11 MED ORDER — LISINOPRIL 5 MG PO TABS
5.0000 mg | ORAL_TABLET | Freq: Two times a day (BID) | ORAL | Status: DC
  Administered 2012-08-11: 5 mg via ORAL
  Filled 2012-08-11 (×3): qty 1

## 2012-08-11 MED ORDER — HEPARIN SODIUM (PORCINE) 1000 UNIT/ML IJ SOLN
INTRAMUSCULAR | Status: AC
  Filled 2012-08-11: qty 1

## 2012-08-11 MED ORDER — ASPIRIN 81 MG PO CHEW
81.0000 mg | CHEWABLE_TABLET | Freq: Every day | ORAL | Status: DC
  Filled 2012-08-11: qty 1

## 2012-08-11 MED ORDER — HEPARIN (PORCINE) IN NACL 2-0.9 UNIT/ML-% IJ SOLN
INTRAMUSCULAR | Status: AC
  Filled 2012-08-11: qty 1000

## 2012-08-11 MED ORDER — MORPHINE SULFATE 2 MG/ML IJ SOLN: 1.0000 mg | INTRAMUSCULAR | Status: DC | PRN

## 2012-08-11 NOTE — H&P (Signed)
Date of Initial H&P: 07/30/12  History reviewed, patient examined, no change in status, stable for cath/PCI.  Rotational atherectomy planned due to heavy calcification.  All questions answered.

## 2012-08-11 NOTE — Progress Notes (Signed)
Site area: right groin  Site Prior to Removal:  Level 0  Pressure Applied For 20 MINUTES    Minutes Beginning at 1430  Manual:   yes  Patient Status During Pull:  stable  Post Pull Groin Site:  Level 0  Post Pull Instructions Given:  yes  Post Pull Pulses Present:  yes  Dressing Applied:  yes  Comments:   

## 2012-08-11 NOTE — Progress Notes (Signed)
RN called, RE: home Imdur 60 mg qAM and 30 mg Qpm. Patient requested home dose (currently ordered 60 mg daily). Pt s/p Rotablator w PCI today, asymptomatic but BP high side 141-153 systolic. Will resume home dose Imdur, which may minimize post-rotational atherectomy vasospasm.

## 2012-08-11 NOTE — CV Procedure (Signed)
PROCEDURE:  Rotational atherectomy of the proximal circumflex and proximal LAD; PCI proximal circumflex; PCI PROXIMAL LAD  INDICATIONS:  Worsening angina despite antianginal therapy with multiple medications  The risks, benefits, and details of the procedure were explained to the patient.  The patient verbalized understanding and wanted to proceed.  Informed written consent was obtained.  PROCEDURE TECHNIQUE:  After Xylocaine anesthesia a 40F sheath was placed in the right femoral artery with a single anterior needle wall stick.   Left coronary angiography was done using a CLS 4 guide catheter.  Angiomax was used for anticoagulation.  An ACT was used to check that the Angiomax is therapeutic.  A Rotafloppy wire was placed across the lesion in the circumflex.  A 1.5 mm burr was used to pretreat the lesion.  A 3.0 x 6 cutting balloon was then used after the burr made several passes.  A 3.5 x 12 Promus drug-eluting stent was deployed and there is an excellent angiographic result with no residual stenosis.  Attention was then turned to the LAD.  The same Rotafloppy wire was placed across the area of disease in the LAD.  The same 1.5 burr was used to treat the area in the proximal LAD.  A 2.5 x 6 noncompliant balloon was then used to treat the area that had been treated with rotational atherectomy.  A 2.5 x 8 Promus drug-eluting stent was then placed.  The proximal portion of the stent was postdilated with a 2.75 x 6 noncompliant balloon.  There is an excellent angiographic result.  There is no residual stenosis.  The patient tolerated the procedure well.  Manual compression will be used to obtain hemostasis.   CONTRAST:  Total of 165 cc.  COMPLICATIONS:  None.        ANGIOGRAPHIC DATA:   The left main coronary artery is patent.  The left anterior descending artery has a proximal 70% lesion which was treated last week with a cutting balloon.  We were unable to advance a stent due to the heavy  calcification.  The left circumflex artery is a large vessel with a proximal 95% stenosis.    IMPRESSIONS:  1. Patent left main coronary artery. 2. Successful rotational atherectomy followed by drug-eluting stent placement to the proximal left anterior descending artery with a 2.5 x 8 Promus drug-eluting stent, postdilated to 2.9 mm in diameter. 3. Successful rotational atherectomy followed by drug-eluting stent placement to the proximal left circumflex artery with a 3.5 x 12 Promus drug-eluting stent.   RECOMMENDATION:  Continue medical therapy for cardiomyopathy.  She will need dual antiplatelet therapy for at least a year.  Hopefully, this revascularization will help her left ventricular function.  She'll be watched overnight.  If there are no complications, she will hopefully be able to go home tomorrow.

## 2012-08-12 DIAGNOSIS — D649 Anemia, unspecified: Secondary | ICD-10-CM | POA: Diagnosis not present

## 2012-08-12 DIAGNOSIS — I428 Other cardiomyopathies: Secondary | ICD-10-CM | POA: Diagnosis not present

## 2012-08-12 DIAGNOSIS — I209 Angina pectoris, unspecified: Secondary | ICD-10-CM | POA: Diagnosis not present

## 2012-08-12 DIAGNOSIS — Z950 Presence of cardiac pacemaker: Secondary | ICD-10-CM | POA: Diagnosis not present

## 2012-08-12 DIAGNOSIS — I2510 Atherosclerotic heart disease of native coronary artery without angina pectoris: Secondary | ICD-10-CM | POA: Diagnosis not present

## 2012-08-12 DIAGNOSIS — I4891 Unspecified atrial fibrillation: Secondary | ICD-10-CM | POA: Diagnosis not present

## 2012-08-12 LAB — CBC
HCT: 29.1 % — ABNORMAL LOW (ref 36.0–46.0)
MCV: 91.8 fL (ref 78.0–100.0)
Platelets: 202 10*3/uL (ref 150–400)
RBC: 3.17 MIL/uL — ABNORMAL LOW (ref 3.87–5.11)
RDW: 14.9 % (ref 11.5–15.5)
WBC: 6.7 10*3/uL (ref 4.0–10.5)

## 2012-08-12 LAB — BASIC METABOLIC PANEL
BUN: 22 mg/dL (ref 6–23)
CO2: 26 mEq/L (ref 19–32)
Chloride: 104 mEq/L (ref 96–112)
GFR calc Af Amer: 67 mL/min — ABNORMAL LOW (ref >90)
Potassium: 3.5 mEq/L (ref 3.5–5.1)

## 2012-08-12 MED ORDER — ISOSORBIDE MONONITRATE ER 60 MG PO TB24: 60.0000 mg | ORAL_TABLET | Freq: Every day | ORAL | Status: DC

## 2012-08-12 MED FILL — Dextrose Inj 5%: INTRAVENOUS | Qty: 1000 | Status: AC

## 2012-08-12 NOTE — Progress Notes (Signed)
CARDIAC REHAB PHASE I   PRE:  Rate/Rhythm: 95afib  BP:  Supine:   Sitting: 128/72  Standing:    SaO2:   MODE:  Ambulation: 550 ft   POST:  Rate/Rhythm: 109afib  BP:  Supine:   Sitting: 144/84  Standing:    SaO2: 93%RA 0800-0844 Pt walked 550 ft with hand held asst with steady gait. C/o slight SOB and a twinge of chest soreness upon return to room. Stated her breathing is better though than before procedure. Improved with rest. Education completed re exercise. Briefly reviewed ed re weighing self daily, NTG use and sodium use. Pt voiced understanding. Referring to [facility] Phase 2.   Luetta Nutting, RN BSN  08/12/2012 8:40 AM

## 2012-08-12 NOTE — Discharge Summary (Signed)
Patient ID: Patty [name] MRN: [id number] DOB/AGE: 09-28-1930 79 y.o.  Admit date: 08/11/2012 Discharge date: 08/12/2012  Primary Discharge Diagnosis CAD Secondary Discharge Diagnosis angina, anemia, AFib, pacer, cardiomyopathy  Significant Diagnostic Studies: angiography: cardiac cath with DES (2.5 x 8) to the LAD and proximal circ (3.5 x 12)  Consults: None  Hospital Course: 79 y/o woman who had worsening angina despite medical therapy.  She had an episode of unstable angina as well.  She had attempted PCI of the prox LAD last week but a stent could not be delivered.  She was brought back for elective angioplasty of the prox LAD and prox circ.  Rotational atherectomy followed by stent were performed in both vessels.  She tolerated the procedure well.  Her Hbg dropped on the day of discharge.  THere was no obvious groin bleeding.  She felt well while walking with cardiac rehab.  Restart COumadin today.  No aspirin.  WIll check f/u CBC next week.  If Hbg is lower, would consider starting iron.   Discharge Exam: Blood pressure 128/72, pulse 91, temperature 98.3 F (36.8 C), temperature source Oral, resp. rate 18, height 5' (1.524 m), weight 79 kg (174 lb 2.6 oz), SpO2 94.00%.   [place]/AT Irregualrly irregular No wheezing Soft, nontender No hematoma 2+right PT pulse  Labs:   Lab Results  Component Value Date   WBC 6.7 08/12/2012   HGB 9.4* 08/12/2012   HCT 29.1* 08/12/2012   MCV 91.8 08/12/2012   PLT 202 08/12/2012    Recent Labs Lab 08/12/12 0540  NA 140  K 3.5  CL 104  CO2 26  BUN 22  CREATININE 0.91  CALCIUM 8.3*  GLUCOSE 104*   Lab Results  Component Value Date   CKTOTAL 70 06/08/2010   CKMB 2.6 06/08/2010   TROPONINI 0.81* 03/14/2012    Lab Results  Component Value Date   CHOL 134 10/03/2010   CHOL 111 06/08/2010   CHOL  Value: 121        ATP III CLASSIFICATION:  <200     mg/dL   Desirable  782-956  mg/dL   Borderline High  >=213    mg/dL   High        0/86/5784   Lab  Results  Component Value Date   HDL 62.80 10/03/2010   HDL 53 06/08/2010   HDL 56 07/19/2009   Lab Results  Component Value Date   LDLCALC 58 10/03/2010   LDLCALC  Value: 49        Total Cholesterol/HDL:CHD Risk Coronary Heart Disease Risk Table                     Men   Women  1/2 Average Risk   3.4   3.3  Average Risk       5.0   4.4  2 X Average Risk   9.6   7.1  3 X Average Risk  23.4   11.0        Use the calculated Patient Ratio above and the CHD Risk Table to determine the patient's CHD Risk.        ATP III CLASSIFICATION (LDL):  <100     mg/dL   Optimal  696-295  mg/dL   Near or Above                    Optimal  130-159  mg/dL   Borderline  284-132  mg/dL   High  >440  mg/dL   Very High 02/08/1094   LDLCALC  Value: 52        Total Cholesterol/HDL:CHD Risk Coronary Heart Disease Risk Table                     Men   Women  1/2 Average Risk   3.4   3.3  Average Risk       5.0   4.4  2 X Average Risk   9.6   7.1  3 X Average Risk  23.4   11.0        Use the calculated Patient Ratio above and the CHD Risk Table to determine the patient's CHD Risk.        ATP III CLASSIFICATION (LDL):  <100     mg/dL   Optimal  045-409  mg/dL   Near or Above                    Optimal  130-159  mg/dL   Borderline  811-914  mg/dL   High  >782     mg/dL   Very High 9/56/2130   Lab Results  Component Value Date   TRIG 66.0 10/03/2010   TRIG 43 06/08/2010   TRIG 63 07/19/2009   Lab Results  Component Value Date   CHOLHDL 2 10/03/2010   CHOLHDL 2.1 06/08/2010   CHOLHDL 2.2 07/19/2009   No results found for this basename: LDLDIRECT      Radiology: QMV:HQIO,NGEX  FOLLOW UP PLANS AND APPOINTMENTS  Future Appointments Provider Department Dept Phone   09/30/2012 11:00 AM Hillis Range, MD [facility] [place]) [phone]   09/30/2012 11:00 AM [facility] Nurse [facility] [phone]   11/24/2012 1:30 PM [facility]  [phone]   11/24/2012 2:00 PM [facility] Covering Provider [address] [facility] [phone]       Medication List         atorvastatin 80 MG tablet  Commonly known as:  LIPITOR  Take 80 mg by mouth daily.     CALTRATE 600+D 600-800 MG-UNIT Tabs  Generic drug:  Calcium Carb-Cholecalciferol  Take 1 tablet by mouth daily.     clopidogrel 75 MG tablet  Commonly known as:  PLAVIX  Take 1 tablet (75 mg total) by mouth daily with breakfast.     COQ10 PO  Take 100 mg by mouth daily. Hold while in hospital     diltiazem 120 MG 24 hr capsule  Commonly known as:  DILACOR XR  Take 120 mg by mouth daily.     FISH OIL PO  Take 1 capsule by mouth 3 (three) times a week. Takes fish oil capsule on Monday, Wednesday, and Friday     furosemide 40 MG tablet  Commonly known as:  LASIX  Take 40 mg by mouth daily. Takes a 1/2 additionally when needed     isosorbide mononitrate 60 MG 24 hr tablet  Commonly known as:  IMDUR  Take 1 tablet (60 mg total) by mouth daily.     lisinopril 10 MG tablet  Commonly known as:  PRINIVIL,ZESTRIL  Take 5 mg by mouth 2 (two) times daily.     metoprolol 100 MG tablet  Commonly known as:  LOPRESSOR  Take 1 tablet (100 mg total) by mouth 2 (two) times daily.     multivitamin tablet  Take 1 tablet by mouth daily.  nitroGLYCERIN 0.4 MG SL tablet  Commonly known as:  NITROSTAT  Place 1 tablet (0.4 mg total) under the tongue every 5 (five) minutes as needed. For chest pain     potassium chloride SA 20 MEQ tablet  Commonly known as:  K-DUR,KLOR-CON  Take 40 mEq by mouth daily.     vitamin E 400 UNIT capsule  Generic drug:  vitamin E  Take 400 Units by mouth daily.           Follow-up Information   Follow up with Corky Crafts., MD On 08/17/2012. (Coumadin check with Jeremy-11AM)    Contact information:   [address] [place] [address] [phone]       BRING ALL MEDICATIONS WITH  YOU TO FOLLOW UP APPOINTMENTS  Time spent with patient to include physician time:20 minutes Signed: Shantaya Bluestone S. 08/12/2012, 9:10 AM

## 2012-08-19 DIAGNOSIS — Z7901 Long term (current) use of anticoagulants: Secondary | ICD-10-CM | POA: Diagnosis not present

## 2012-08-19 DIAGNOSIS — I4891 Unspecified atrial fibrillation: Secondary | ICD-10-CM | POA: Diagnosis not present

## 2012-08-26 DIAGNOSIS — Z7901 Long term (current) use of anticoagulants: Secondary | ICD-10-CM | POA: Diagnosis not present

## 2012-08-26 DIAGNOSIS — I4891 Unspecified atrial fibrillation: Secondary | ICD-10-CM | POA: Diagnosis not present

## 2012-09-08 ENCOUNTER — Other Ambulatory Visit: Payer: Self-pay

## 2012-09-09 ENCOUNTER — Encounter (HOSPITAL_COMMUNITY)
Admission: RE | Admit: 2012-09-09 | Discharge: 2012-09-09 | Disposition: A | Discharge status: Home / Self Care | Payer: Medicare Other | Source: Ambulatory Visit | Attending: Interventional Cardiology | Admitting: Interventional Cardiology

## 2012-09-09 DIAGNOSIS — Z7901 Long term (current) use of anticoagulants: Secondary | ICD-10-CM | POA: Insufficient documentation

## 2012-09-09 DIAGNOSIS — Z9861 Coronary angioplasty status: Secondary | ICD-10-CM | POA: Insufficient documentation

## 2012-09-09 DIAGNOSIS — Z87891 Personal history of nicotine dependence: Secondary | ICD-10-CM | POA: Insufficient documentation

## 2012-09-09 DIAGNOSIS — I252 Old myocardial infarction: Secondary | ICD-10-CM | POA: Insufficient documentation

## 2012-09-09 DIAGNOSIS — I2510 Atherosclerotic heart disease of native coronary artery without angina pectoris: Secondary | ICD-10-CM | POA: Insufficient documentation

## 2012-09-09 DIAGNOSIS — Z5189 Encounter for other specified aftercare: Secondary | ICD-10-CM | POA: Insufficient documentation

## 2012-09-09 DIAGNOSIS — I10 Essential (primary) hypertension: Secondary | ICD-10-CM | POA: Insufficient documentation

## 2012-09-09 DIAGNOSIS — I4891 Unspecified atrial fibrillation: Secondary | ICD-10-CM | POA: Diagnosis not present

## 2012-09-09 DIAGNOSIS — I509 Heart failure, unspecified: Secondary | ICD-10-CM | POA: Insufficient documentation

## 2012-09-09 NOTE — Progress Notes (Signed)
Cardiac Rehab Medication Review by a Pharmacist  Does the patient  feel that his/her medications are working for him/her?  yes  Has the patient been experiencing any side effects to the medications prescribed?  no  Does the patient measure his/her own blood pressure or blood glucose at home?  yes   Does the patient have any problems obtaining medications due to transportation or finances?   yes  Understanding of regimen: good Understanding of indications: good Potential of compliance: fair    Pharmacist comments: Patty [name] if a very pleasant and talkative patient coming in today for cardiac rehab.  She has had an exciting last couple months with numerous caths.  She reports being off her warfarin between June 27th and July 10th for those procedures.  Her last INR was the first time it has been therapeutic since then and she is getting it checke this afternoon. She is unsure which strength she takes of the warfarin at home, but says the tablet may be lavender in color.  Vernella reports  that she had horrible side effects with amiodarone and no longer takes it.  The only other side effect she has is occasional constipation for which she takes docusate.  She takes her BP daily.  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  I encouraged her to ask her home pharmacy about coupons.  Regarding the IMDUR, she does report a recent increase in therapy and said she does half the 60mg  24h tabs for her afternoon dose. The only trouble she has remembering her meds are with her afternoon doses, when she at "full throttle'.   Shelba Flake Achilles Dunk, PharmD Clinical Pharmacist - Resident Pager: [phone] Pharmacy: [phone] 09/09/2012 9:23 AM

## 2012-09-13 ENCOUNTER — Encounter (HOSPITAL_COMMUNITY): Payer: Self-pay

## 2012-09-13 ENCOUNTER — Encounter (HOSPITAL_COMMUNITY)
Admission: RE | Admit: 2012-09-13 | Discharge: 2012-09-13 | Disposition: A | Discharge status: Home / Self Care | Payer: Medicare Other | Source: Ambulatory Visit | Attending: Interventional Cardiology | Admitting: Interventional Cardiology

## 2012-09-13 DIAGNOSIS — I2510 Atherosclerotic heart disease of native coronary artery without angina pectoris: Secondary | ICD-10-CM | POA: Diagnosis not present

## 2012-09-13 DIAGNOSIS — Z87891 Personal history of nicotine dependence: Secondary | ICD-10-CM | POA: Diagnosis not present

## 2012-09-13 DIAGNOSIS — Z7901 Long term (current) use of anticoagulants: Secondary | ICD-10-CM | POA: Diagnosis not present

## 2012-09-13 DIAGNOSIS — I10 Essential (primary) hypertension: Secondary | ICD-10-CM | POA: Diagnosis not present

## 2012-09-13 DIAGNOSIS — Z9861 Coronary angioplasty status: Secondary | ICD-10-CM | POA: Diagnosis not present

## 2012-09-13 DIAGNOSIS — I4891 Unspecified atrial fibrillation: Secondary | ICD-10-CM | POA: Diagnosis not present

## 2012-09-13 DIAGNOSIS — Z5189 Encounter for other specified aftercare: Secondary | ICD-10-CM | POA: Diagnosis not present

## 2012-09-13 DIAGNOSIS — I509 Heart failure, unspecified: Secondary | ICD-10-CM | POA: Diagnosis not present

## 2012-09-13 DIAGNOSIS — I252 Old myocardial infarction: Secondary | ICD-10-CM | POA: Diagnosis not present

## 2012-09-13 NOTE — Progress Notes (Signed)
Pt started cardiac rehab today.  Pt tolerated light exercise without difficulty.  VSS, telemetry-atrial fibrillation, v paced.  PHQ-0.  Pt oriented to exercise equipment and routine.  Understanding verbalized.  Pt psychosocial assessment reveals no barriers to rehab participation.  Pt quality of life is slightly altered by her physical constraints which limits her ability to perform tasks as prior to her illness.  Pt greatest recent change is her need to retire from teaching.  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  However her fatigue was too great to continue.    Pt exhibits positive outlook and coping skills with  has supportive family.  Offered emotional support and reassurance.  Will continue to monitor.

## 2012-09-15 ENCOUNTER — Encounter (HOSPITAL_COMMUNITY)
Admission: RE | Admit: 2012-09-15 | Discharge: 2012-09-15 | Disposition: A | Discharge status: Home / Self Care | Payer: Medicare Other | Source: Ambulatory Visit | Attending: Interventional Cardiology | Admitting: Interventional Cardiology

## 2012-09-15 DIAGNOSIS — R609 Edema, unspecified: Secondary | ICD-10-CM | POA: Diagnosis not present

## 2012-09-15 DIAGNOSIS — I4891 Unspecified atrial fibrillation: Secondary | ICD-10-CM | POA: Diagnosis not present

## 2012-09-15 DIAGNOSIS — I10 Essential (primary) hypertension: Secondary | ICD-10-CM | POA: Diagnosis not present

## 2012-09-15 DIAGNOSIS — I2510 Atherosclerotic heart disease of native coronary artery without angina pectoris: Secondary | ICD-10-CM | POA: Diagnosis not present

## 2012-09-15 DIAGNOSIS — Z7901 Long term (current) use of anticoagulants: Secondary | ICD-10-CM | POA: Diagnosis not present

## 2012-09-15 DIAGNOSIS — Z5189 Encounter for other specified aftercare: Secondary | ICD-10-CM | POA: Diagnosis not present

## 2012-09-15 DIAGNOSIS — I428 Other cardiomyopathies: Secondary | ICD-10-CM | POA: Diagnosis not present

## 2012-09-15 DIAGNOSIS — I252 Old myocardial infarction: Secondary | ICD-10-CM | POA: Diagnosis not present

## 2012-09-15 NOTE — Progress Notes (Addendum)
Reviewed home exercise with pt today.  Pt plans to walk at local retail stores for 30 minutes, 2-4 days/week outside of Cardiac Rehab for exercise.  Reviewed THR, pulse, RPE, sign and symptoms, NTG use, and when to call 911 or MD.  Pt voiced understanding.  Alexia Freestone, MS, ACSM RCEP 2:38 PM

## 2012-09-17 ENCOUNTER — Encounter (HOSPITAL_COMMUNITY)
Admission: RE | Admit: 2012-09-17 | Discharge: 2012-09-17 | Disposition: A | Discharge status: Home / Self Care | Payer: Medicare Other | Source: Ambulatory Visit | Attending: Interventional Cardiology | Admitting: Interventional Cardiology

## 2012-09-17 DIAGNOSIS — I2510 Atherosclerotic heart disease of native coronary artery without angina pectoris: Secondary | ICD-10-CM | POA: Diagnosis not present

## 2012-09-17 DIAGNOSIS — I252 Old myocardial infarction: Secondary | ICD-10-CM | POA: Diagnosis not present

## 2012-09-17 DIAGNOSIS — Z5189 Encounter for other specified aftercare: Secondary | ICD-10-CM | POA: Diagnosis not present

## 2012-09-17 DIAGNOSIS — I4891 Unspecified atrial fibrillation: Secondary | ICD-10-CM | POA: Diagnosis not present

## 2012-09-17 DIAGNOSIS — I10 Essential (primary) hypertension: Secondary | ICD-10-CM | POA: Diagnosis not present

## 2012-09-17 DIAGNOSIS — Z7901 Long term (current) use of anticoagulants: Secondary | ICD-10-CM | POA: Diagnosis not present

## 2012-09-20 ENCOUNTER — Encounter (HOSPITAL_COMMUNITY)
Admission: RE | Admit: 2012-09-20 | Discharge: 2012-09-20 | Disposition: A | Discharge status: Home / Self Care | Payer: Medicare Other | Source: Ambulatory Visit | Attending: Interventional Cardiology | Admitting: Interventional Cardiology

## 2012-09-20 DIAGNOSIS — Z5189 Encounter for other specified aftercare: Secondary | ICD-10-CM | POA: Diagnosis not present

## 2012-09-20 DIAGNOSIS — I252 Old myocardial infarction: Secondary | ICD-10-CM | POA: Diagnosis not present

## 2012-09-20 DIAGNOSIS — I10 Essential (primary) hypertension: Secondary | ICD-10-CM | POA: Diagnosis not present

## 2012-09-20 DIAGNOSIS — I2510 Atherosclerotic heart disease of native coronary artery without angina pectoris: Secondary | ICD-10-CM | POA: Diagnosis not present

## 2012-09-20 DIAGNOSIS — I4891 Unspecified atrial fibrillation: Secondary | ICD-10-CM | POA: Diagnosis not present

## 2012-09-20 DIAGNOSIS — Z7901 Long term (current) use of anticoagulants: Secondary | ICD-10-CM | POA: Diagnosis not present

## 2012-09-22 ENCOUNTER — Encounter (HOSPITAL_COMMUNITY)
Admission: RE | Admit: 2012-09-22 | Discharge: 2012-09-22 | Disposition: A | Discharge status: Home / Self Care | Payer: Medicare Other | Source: Ambulatory Visit | Attending: Interventional Cardiology | Admitting: Interventional Cardiology

## 2012-09-22 DIAGNOSIS — I4891 Unspecified atrial fibrillation: Secondary | ICD-10-CM | POA: Diagnosis not present

## 2012-09-22 DIAGNOSIS — I10 Essential (primary) hypertension: Secondary | ICD-10-CM | POA: Diagnosis not present

## 2012-09-22 DIAGNOSIS — Z7901 Long term (current) use of anticoagulants: Secondary | ICD-10-CM | POA: Diagnosis not present

## 2012-09-22 DIAGNOSIS — I2510 Atherosclerotic heart disease of native coronary artery without angina pectoris: Secondary | ICD-10-CM | POA: Diagnosis not present

## 2012-09-22 DIAGNOSIS — I252 Old myocardial infarction: Secondary | ICD-10-CM | POA: Diagnosis not present

## 2012-09-22 DIAGNOSIS — Z5189 Encounter for other specified aftercare: Secondary | ICD-10-CM | POA: Diagnosis not present

## 2012-09-24 ENCOUNTER — Encounter (HOSPITAL_COMMUNITY)
Admission: RE | Admit: 2012-09-24 | Discharge: 2012-09-24 | Disposition: A | Discharge status: Home / Self Care | Payer: Medicare Other | Source: Ambulatory Visit | Attending: Interventional Cardiology | Admitting: Interventional Cardiology

## 2012-09-24 DIAGNOSIS — I10 Essential (primary) hypertension: Secondary | ICD-10-CM | POA: Diagnosis not present

## 2012-09-24 DIAGNOSIS — Z7901 Long term (current) use of anticoagulants: Secondary | ICD-10-CM | POA: Diagnosis not present

## 2012-09-24 DIAGNOSIS — I2510 Atherosclerotic heart disease of native coronary artery without angina pectoris: Secondary | ICD-10-CM | POA: Diagnosis not present

## 2012-09-24 DIAGNOSIS — Z5189 Encounter for other specified aftercare: Secondary | ICD-10-CM | POA: Diagnosis not present

## 2012-09-24 DIAGNOSIS — I4891 Unspecified atrial fibrillation: Secondary | ICD-10-CM | POA: Diagnosis not present

## 2012-09-24 DIAGNOSIS — I252 Old myocardial infarction: Secondary | ICD-10-CM | POA: Diagnosis not present

## 2012-09-27 ENCOUNTER — Encounter (HOSPITAL_COMMUNITY)
Admission: RE | Admit: 2012-09-27 | Discharge: 2012-09-27 | Disposition: A | Discharge status: Home / Self Care | Payer: Medicare Other | Source: Ambulatory Visit | Attending: Interventional Cardiology | Admitting: Interventional Cardiology

## 2012-09-27 DIAGNOSIS — I2510 Atherosclerotic heart disease of native coronary artery without angina pectoris: Secondary | ICD-10-CM | POA: Diagnosis not present

## 2012-09-27 DIAGNOSIS — I10 Essential (primary) hypertension: Secondary | ICD-10-CM | POA: Diagnosis not present

## 2012-09-27 DIAGNOSIS — I252 Old myocardial infarction: Secondary | ICD-10-CM | POA: Diagnosis not present

## 2012-09-27 DIAGNOSIS — Z7901 Long term (current) use of anticoagulants: Secondary | ICD-10-CM | POA: Diagnosis not present

## 2012-09-27 DIAGNOSIS — Z5189 Encounter for other specified aftercare: Secondary | ICD-10-CM | POA: Diagnosis not present

## 2012-09-27 DIAGNOSIS — I4891 Unspecified atrial fibrillation: Secondary | ICD-10-CM | POA: Diagnosis not present

## 2012-09-28 DIAGNOSIS — I10 Essential (primary) hypertension: Secondary | ICD-10-CM | POA: Diagnosis not present

## 2012-09-28 DIAGNOSIS — G4733 Obstructive sleep apnea (adult) (pediatric): Secondary | ICD-10-CM | POA: Diagnosis not present

## 2012-09-29 ENCOUNTER — Encounter (HOSPITAL_COMMUNITY)
Admission: RE | Admit: 2012-09-29 | Discharge: 2012-09-29 | Disposition: A | Discharge status: Home / Self Care | Payer: Medicare Other | Source: Ambulatory Visit | Attending: Interventional Cardiology | Admitting: Interventional Cardiology

## 2012-09-29 DIAGNOSIS — Z5189 Encounter for other specified aftercare: Secondary | ICD-10-CM | POA: Diagnosis not present

## 2012-09-29 DIAGNOSIS — I252 Old myocardial infarction: Secondary | ICD-10-CM | POA: Diagnosis not present

## 2012-09-29 DIAGNOSIS — Z7901 Long term (current) use of anticoagulants: Secondary | ICD-10-CM | POA: Diagnosis not present

## 2012-09-29 DIAGNOSIS — I4891 Unspecified atrial fibrillation: Secondary | ICD-10-CM | POA: Diagnosis not present

## 2012-09-29 DIAGNOSIS — I10 Essential (primary) hypertension: Secondary | ICD-10-CM | POA: Diagnosis not present

## 2012-09-29 DIAGNOSIS — I2510 Atherosclerotic heart disease of native coronary artery without angina pectoris: Secondary | ICD-10-CM | POA: Diagnosis not present

## 2012-09-30 ENCOUNTER — Encounter: Payer: Self-pay | Admitting: Internal Medicine

## 2012-09-30 ENCOUNTER — Ambulatory Visit (INDEPENDENT_AMBULATORY_CARE_PROVIDER_SITE_OTHER): Payer: Medicare Other | Admitting: Internal Medicine

## 2012-09-30 VITALS — BP 116/66 | HR 60 | Ht 60.0 in | Wt 173.0 lb

## 2012-09-30 DIAGNOSIS — I2510 Atherosclerotic heart disease of native coronary artery without angina pectoris: Secondary | ICD-10-CM | POA: Diagnosis not present

## 2012-09-30 DIAGNOSIS — I4891 Unspecified atrial fibrillation: Secondary | ICD-10-CM | POA: Diagnosis not present

## 2012-09-30 DIAGNOSIS — I495 Sick sinus syndrome: Secondary | ICD-10-CM | POA: Diagnosis not present

## 2012-09-30 DIAGNOSIS — I509 Heart failure, unspecified: Secondary | ICD-10-CM | POA: Diagnosis not present

## 2012-09-30 DIAGNOSIS — I5031 Acute diastolic (congestive) heart failure: Secondary | ICD-10-CM

## 2012-09-30 LAB — PACEMAKER DEVICE OBSERVATION
BATTERY VOLTAGE: >3.3 V
RV LEAD AMPLITUDE: 5 mv
RV LEAD IMPEDENCE PM: 380 Ohm

## 2012-09-30 NOTE — Progress Notes (Signed)
PCP: Darnelle Bos, MD Primary Cardiologist:  Dr Louretta Shorten Patty [name] is a 79 y.o. female who presents today for routine electrophysiology followup.  She recently presented with ACS and underwent PCI by Dr Eldridge Dace.  She has done well since with resolution of her chest pain.  Her energy remains improved.  Today, she denies symptoms of palpitations, exertional chest pain, shortness of breath,  lower extremity edema, dizziness, presyncope, or syncope.  The patient is otherwise without complaint today.   Past Medical History  Diagnosis Date  . IHD (ischemic heart disease)     Remote MI in 2000 with stent to LAD and LCX. Failed  intervention to OM in 2009. Managed medically since that time.  . Hypertension   . Hyperlipidemia   . Obesity   . Granulosa cell tumor     Has had several surgeries due to her tumor  . Diverticulitis   . Shortness of breath   . Peripheral vascular disease   . Other and unspecified angina pectoris   . MI (myocardial infarction) 06/1998    "when they were putting in the stent" (08/11/2012)  . Varicose veins   . Granulosa cell carcinoma     abd; "last episode was in 2009" (08/11/2012)  . Sleep apnea   . Dysrhythmia   . Atrial flutter   . Tachycardia-bradycardia syndrome     leadless pacemaker ([name]) implanted by Dr Johney Frame  . Atrial fibrillation     "just developed this winter 2013" (08/11/2012)   Past Surgical History  Procedure Laterality Date  . Cholecystectomy  1980's  . Salivary gland surgery  2000's    "had a little lump removed; granulosa related; it was benign" (08/11/2012)  . US echocardiography  11/21/2003    EF 55-60%  . Cardiovascular stress test  03/23/2009    EF 72%  . Hernia repair  laparoscopic 2005 and open 2007  . Abdominal hysterectomy    . Colon surgery  2004    colectomy for diverticulosis  . Granulosa tumor excision  2000; 2003; 2004; 2007    "all in my abdomen including small intestines, outside my ?uterus/etc" (08/11/2012)  .  Diagnostic laparoscopy      gallbladder removal; abdominal hernia repair  . Tee without cardioversion  12/31/2011    Procedure: TRANSESOPHAGEAL ECHOCARDIOGRAM (TEE);  Surgeon: Corky Crafts, MD;  Location: [facility] ENDOSCOPY;  Service: Cardiovascular;  Laterality: N/A;  . Cardioversion  12/31/2011    Procedure: CARDIOVERSION;  Surgeon: Corky Crafts, MD;  Location: [facility] ENDOSCOPY;  Service: Cardiovascular;  Laterality: N/A;  . Pacemaker insertion  2/11/4    [name] (SJM) leadless pacemaker (LEADLESS II STUDY PATEINT)  . Cardiac catheterization  09/03/2007    EF 70%; Failed attempt at PCI to OM  . Cardiac catheterization  11/01/2003    EF 70%  . Coronary angioplasty    . Coronary angioplasty with stent placement  08/11/2012    "got 2 stents today; already have 3 in there" (08/11/2012)  . Insert / replace / remove pacemaker  03/16/2012    [name] (SJM) leadless pacemaker (LEADLESS II STUDY PATEINT)  . Varicose vein surgery Bilateral 1977    Current Outpatient Prescriptions  Medication Sig Dispense Refill  . atorvastatin (LIPITOR) 80 MG tablet Take 80 mg by mouth every morning.      . Calcium Carb-Cholecalciferol (CALTRATE 600+D) 600-800 MG-UNIT TABS Take by mouth every morning.      . clopidogrel (PLAVIX) 75 MG tablet Take 1 tablet (75 mg total) by  mouth daily with breakfast.  30 tablet  11  . Coenzyme Q-10 100 MG capsule Take 100 mg by mouth every Monday, Wednesday, and Friday.      . docusate sodium (COLACE) 100 MG capsule Take 100 mg by mouth daily as needed for constipation.      . furosemide (LASIX) 40 MG tablet Take 40 mg by mouth every morning. Takes an additional 1/2 tablet between 4-5pm if needed      . isosorbide mononitrate (IMDUR) 60 MG 24 hr tablet Take 60 mg by mouth every morning.      . isosorbide mononitrate (IMDUR) 60 MG 24 hr tablet Take 30 mg by mouth every evening.       Marland Kitchen lisinopril (PRINIVIL,ZESTRIL) 10 MG tablet Take 5 mg by mouth 2 (two) times daily.      .  metoprolol (LOPRESSOR) 100 MG tablet Take 1 tablet (100 mg total) by mouth 2 (two) times daily.  60 tablet  11  . Multiple Vitamin (MULTIVITAMIN WITH MINERALS) TABS tablet Take 1 tablet by mouth every morning.      . nitroGLYCERIN (NITROSTAT) 0.4 MG SL tablet Place 1 tablet (0.4 mg total) under the tongue every 5 (five) minutes as needed. For chest pain  35 tablet  11  . Omega-3 Fatty Acids (FISH OIL PO) Take 1 capsule by mouth 3 (three) times a week. Takes fish oil capsule on Monday, Wednesday, and Friday      . potassium chloride SA (K-DUR,KLOR-CON) 20 MEQ tablet Take 40 mEq by mouth every morning.      . vitamin Patty 400 UNIT capsule Take 400 Units by mouth every morning.      . WARFARIN SODIUM PO Take by mouth every evening. Takes 1 whole tablet daily except 1/2 tablet on Sunday and Tuesday       No current facility-administered medications for this visit.    Physical Exam: Filed Vitals:   09/30/12 1111  BP: 150/71  Pulse: 60  Height: 5' (1.524 m)  Weight: 173 lb (78.472 kg)    GEN- The patient is well appearing, alert and oriented x 3 today.   Head- normocephalic, atraumatic Eyes-  Sclera clear, conjunctiva pink Ears- hearing intact Oropharynx- clear Lungs- Clear to ausculation bilaterally, normal work of breathing Heart- Regular rate and rhythm, no murmurs, rubs or gallops, PMI not laterally displaced GI- soft, NT, ND, + BS Extremities- no clubbing, cyanosis, or edema  Pacemaker interrogation- reviewed in detail today,  See PACEART report  ekg today reveals sinus, V paced at 60 bpm (VVI)  Assessment and Plan:  1. Tachycardia/ Bradycardia Normal pacemaker function See Pace Art report No changes today Consider decreasing lower pacing rate upon return to 50 bpm to reduce V pacing and also reduce likelihood of pacemaker syndrome.  As she has had no symptoms of this at all, I have made no changes today.  2. afib Continue rate control and anticoagulation long term  Return to  see me in 6 months Follow-up with Dr Abe People as scheduled

## 2012-09-30 NOTE — Patient Instructions (Addendum)
Your physician wants you to follow-up in: 6 months with Dr. Allred. You will receive a reminder letter in the mail two months in advance. If you don't receive a letter, please call our office to schedule the follow-up appointment.  

## 2012-10-01 ENCOUNTER — Telehealth: Payer: Self-pay | Admitting: Oncology

## 2012-10-01 ENCOUNTER — Encounter (HOSPITAL_COMMUNITY)
Admission: RE | Admit: 2012-10-01 | Discharge: 2012-10-01 | Disposition: A | Discharge status: Home / Self Care | Payer: Medicare Other | Source: Ambulatory Visit | Attending: Interventional Cardiology | Admitting: Interventional Cardiology

## 2012-10-01 DIAGNOSIS — Z5189 Encounter for other specified aftercare: Secondary | ICD-10-CM | POA: Diagnosis not present

## 2012-10-01 DIAGNOSIS — Z7901 Long term (current) use of anticoagulants: Secondary | ICD-10-CM | POA: Diagnosis not present

## 2012-10-01 DIAGNOSIS — I4891 Unspecified atrial fibrillation: Secondary | ICD-10-CM | POA: Diagnosis not present

## 2012-10-01 DIAGNOSIS — I2510 Atherosclerotic heart disease of native coronary artery without angina pectoris: Secondary | ICD-10-CM | POA: Diagnosis not present

## 2012-10-01 DIAGNOSIS — I10 Essential (primary) hypertension: Secondary | ICD-10-CM | POA: Diagnosis not present

## 2012-10-01 DIAGNOSIS — I252 Old myocardial infarction: Secondary | ICD-10-CM | POA: Diagnosis not present

## 2012-10-01 NOTE — Telephone Encounter (Signed)
s.w. pt husband and advised on time change of 10.22.14 appt...ok and awre

## 2012-10-04 ENCOUNTER — Encounter (HOSPITAL_COMMUNITY): Payer: Medicare Other

## 2012-10-04 DIAGNOSIS — Z9861 Coronary angioplasty status: Secondary | ICD-10-CM | POA: Insufficient documentation

## 2012-10-04 DIAGNOSIS — Z87891 Personal history of nicotine dependence: Secondary | ICD-10-CM | POA: Insufficient documentation

## 2012-10-04 DIAGNOSIS — I252 Old myocardial infarction: Secondary | ICD-10-CM | POA: Insufficient documentation

## 2012-10-04 DIAGNOSIS — Z5189 Encounter for other specified aftercare: Secondary | ICD-10-CM | POA: Insufficient documentation

## 2012-10-04 DIAGNOSIS — I4891 Unspecified atrial fibrillation: Secondary | ICD-10-CM | POA: Insufficient documentation

## 2012-10-04 DIAGNOSIS — I2510 Atherosclerotic heart disease of native coronary artery without angina pectoris: Secondary | ICD-10-CM | POA: Insufficient documentation

## 2012-10-04 DIAGNOSIS — I509 Heart failure, unspecified: Secondary | ICD-10-CM | POA: Insufficient documentation

## 2012-10-04 DIAGNOSIS — Z7901 Long term (current) use of anticoagulants: Secondary | ICD-10-CM | POA: Insufficient documentation

## 2012-10-04 DIAGNOSIS — I10 Essential (primary) hypertension: Secondary | ICD-10-CM | POA: Insufficient documentation

## 2012-10-06 ENCOUNTER — Encounter (HOSPITAL_COMMUNITY)
Admission: RE | Admit: 2012-10-06 | Discharge: 2012-10-06 | Disposition: A | Discharge status: Home / Self Care | Payer: Medicare Other | Source: Ambulatory Visit | Attending: Interventional Cardiology | Admitting: Interventional Cardiology

## 2012-10-06 DIAGNOSIS — I10 Essential (primary) hypertension: Secondary | ICD-10-CM | POA: Diagnosis not present

## 2012-10-06 DIAGNOSIS — I4891 Unspecified atrial fibrillation: Secondary | ICD-10-CM | POA: Diagnosis not present

## 2012-10-06 DIAGNOSIS — Z7901 Long term (current) use of anticoagulants: Secondary | ICD-10-CM | POA: Diagnosis not present

## 2012-10-06 DIAGNOSIS — I2510 Atherosclerotic heart disease of native coronary artery without angina pectoris: Secondary | ICD-10-CM | POA: Diagnosis not present

## 2012-10-06 DIAGNOSIS — Z87891 Personal history of nicotine dependence: Secondary | ICD-10-CM | POA: Diagnosis not present

## 2012-10-06 DIAGNOSIS — Z5189 Encounter for other specified aftercare: Secondary | ICD-10-CM | POA: Diagnosis not present

## 2012-10-06 DIAGNOSIS — I252 Old myocardial infarction: Secondary | ICD-10-CM | POA: Diagnosis not present

## 2012-10-06 DIAGNOSIS — Z9861 Coronary angioplasty status: Secondary | ICD-10-CM | POA: Diagnosis not present

## 2012-10-06 DIAGNOSIS — I509 Heart failure, unspecified: Secondary | ICD-10-CM | POA: Diagnosis not present

## 2012-10-08 ENCOUNTER — Encounter (HOSPITAL_COMMUNITY)
Admission: RE | Admit: 2012-10-08 | Discharge: 2012-10-08 | Disposition: A | Discharge status: Home / Self Care | Payer: Medicare Other | Source: Ambulatory Visit | Attending: Interventional Cardiology | Admitting: Interventional Cardiology

## 2012-10-08 DIAGNOSIS — I4891 Unspecified atrial fibrillation: Secondary | ICD-10-CM | POA: Diagnosis not present

## 2012-10-08 DIAGNOSIS — Z7901 Long term (current) use of anticoagulants: Secondary | ICD-10-CM | POA: Diagnosis not present

## 2012-10-11 ENCOUNTER — Encounter (HOSPITAL_COMMUNITY)
Admission: RE | Admit: 2012-10-11 | Discharge: 2012-10-11 | Disposition: A | Discharge status: Home / Self Care | Payer: Medicare Other | Source: Ambulatory Visit | Attending: Interventional Cardiology | Admitting: Interventional Cardiology

## 2012-10-13 ENCOUNTER — Encounter (HOSPITAL_COMMUNITY)
Admission: RE | Admit: 2012-10-13 | Discharge: 2012-10-13 | Disposition: A | Discharge status: Home / Self Care | Payer: Medicare Other | Source: Ambulatory Visit | Attending: Interventional Cardiology | Admitting: Interventional Cardiology

## 2012-10-15 ENCOUNTER — Encounter (HOSPITAL_COMMUNITY)
Admission: RE | Admit: 2012-10-15 | Discharge: 2012-10-15 | Disposition: A | Discharge status: Home / Self Care | Payer: Medicare Other | Source: Ambulatory Visit | Attending: Interventional Cardiology | Admitting: Interventional Cardiology

## 2012-10-18 ENCOUNTER — Encounter (HOSPITAL_COMMUNITY)
Admission: RE | Admit: 2012-10-18 | Discharge: 2012-10-18 | Disposition: A | Discharge status: Home / Self Care | Payer: Medicare Other | Source: Ambulatory Visit | Attending: Interventional Cardiology | Admitting: Interventional Cardiology

## 2012-10-20 ENCOUNTER — Encounter (HOSPITAL_COMMUNITY)
Admission: RE | Admit: 2012-10-20 | Discharge: 2012-10-20 | Disposition: A | Discharge status: Home / Self Care | Payer: Medicare Other | Source: Ambulatory Visit | Attending: Interventional Cardiology | Admitting: Interventional Cardiology

## 2012-10-22 ENCOUNTER — Encounter (HOSPITAL_COMMUNITY)
Admission: RE | Admit: 2012-10-22 | Discharge: 2012-10-22 | Disposition: A | Discharge status: Home / Self Care | Payer: Medicare Other | Source: Ambulatory Visit | Attending: Interventional Cardiology | Admitting: Interventional Cardiology

## 2012-10-25 ENCOUNTER — Encounter (HOSPITAL_COMMUNITY)
Admission: RE | Admit: 2012-10-25 | Discharge: 2012-10-25 | Disposition: A | Discharge status: Home / Self Care | Payer: Medicare Other | Source: Ambulatory Visit | Attending: Interventional Cardiology | Admitting: Interventional Cardiology

## 2012-10-27 ENCOUNTER — Encounter (HOSPITAL_COMMUNITY)
Admission: RE | Admit: 2012-10-27 | Discharge: 2012-10-27 | Disposition: A | Discharge status: Home / Self Care | Payer: Medicare Other | Source: Ambulatory Visit | Attending: Interventional Cardiology | Admitting: Interventional Cardiology

## 2012-10-28 ENCOUNTER — Ambulatory Visit (INDEPENDENT_AMBULATORY_CARE_PROVIDER_SITE_OTHER): Payer: Medicare Other | Admitting: General Surgery

## 2012-10-29 ENCOUNTER — Encounter (HOSPITAL_COMMUNITY): Payer: Medicare Other

## 2012-11-01 ENCOUNTER — Encounter (HOSPITAL_COMMUNITY): Payer: Medicare Other

## 2012-11-03 ENCOUNTER — Encounter (HOSPITAL_COMMUNITY)
Admission: RE | Admit: 2012-11-03 | Discharge: 2012-11-03 | Disposition: A | Discharge status: Home / Self Care | Payer: Medicare Other | Source: Ambulatory Visit | Attending: Interventional Cardiology | Admitting: Interventional Cardiology

## 2012-11-03 DIAGNOSIS — H6120 Impacted cerumen, unspecified ear: Secondary | ICD-10-CM | POA: Diagnosis not present

## 2012-11-03 DIAGNOSIS — I2510 Atherosclerotic heart disease of native coronary artery without angina pectoris: Secondary | ICD-10-CM | POA: Insufficient documentation

## 2012-11-03 DIAGNOSIS — Z9861 Coronary angioplasty status: Secondary | ICD-10-CM | POA: Insufficient documentation

## 2012-11-03 DIAGNOSIS — Z87891 Personal history of nicotine dependence: Secondary | ICD-10-CM | POA: Diagnosis not present

## 2012-11-03 DIAGNOSIS — I10 Essential (primary) hypertension: Secondary | ICD-10-CM | POA: Diagnosis not present

## 2012-11-03 DIAGNOSIS — Z7901 Long term (current) use of anticoagulants: Secondary | ICD-10-CM | POA: Diagnosis not present

## 2012-11-03 DIAGNOSIS — I4891 Unspecified atrial fibrillation: Secondary | ICD-10-CM | POA: Diagnosis not present

## 2012-11-03 DIAGNOSIS — Z5189 Encounter for other specified aftercare: Secondary | ICD-10-CM | POA: Diagnosis not present

## 2012-11-03 DIAGNOSIS — I252 Old myocardial infarction: Secondary | ICD-10-CM | POA: Diagnosis not present

## 2012-11-03 DIAGNOSIS — I509 Heart failure, unspecified: Secondary | ICD-10-CM | POA: Diagnosis not present

## 2012-11-05 ENCOUNTER — Encounter (HOSPITAL_COMMUNITY)
Admission: RE | Admit: 2012-11-05 | Discharge: 2012-11-05 | Disposition: A | Discharge status: Home / Self Care | Payer: Medicare Other | Source: Ambulatory Visit | Attending: Interventional Cardiology | Admitting: Interventional Cardiology

## 2012-11-05 ENCOUNTER — Encounter: Payer: Self-pay | Admitting: Cardiology

## 2012-11-05 ENCOUNTER — Telehealth: Payer: Self-pay | Admitting: Interventional Cardiology

## 2012-11-05 NOTE — Telephone Encounter (Signed)
Follow Up:  Pt states she is returning Amy's phone call.. Pt states she is driving home... Please try her at her cell or home if she doesn't answer.

## 2012-11-05 NOTE — Telephone Encounter (Signed)
Pt states pt is having more tightness than pain. Pt states it is very slight. Pt did not exercise at rehab today. Pt denies SOB. Pt just wanted [place] to know. Pt will continue to monitor unless Dr. Eldridge Dace thinks otherwise.

## 2012-11-05 NOTE — Telephone Encounter (Signed)
Any recommendation on pts CP? I have called pt and she is at rehab and she will call me when she gets home.

## 2012-11-05 NOTE — Telephone Encounter (Signed)
Continue to monitor

## 2012-11-05 NOTE — Progress Notes (Signed)
Pt arrived at cardiac rehab reporting episodes of chest tightness past 2 evenings.  Pt rates 3/10 associated with cooking dinner, denies dyspnea, dizziness or radiation of pain.  Pt has used NTG SL x1 with relief 2 days ago. Last night pt states she continued to work tightness resolved on its own.  Pt denies pain at this time.  BP-140/80.   Pt did not exercise, however did participate in seated stretches and relaxation activities without discomfort.  PC to Dr. Hoyle Barr office to review symptoms and make appt. Left message with Dr. Maylon Cos nurse to call.  Pt instructed to avoid strenous activity until cleared by Dr. Eldridge Dace.  Pt reminded proper use of NTG and when to call 911.  Understanding verbalized.

## 2012-11-05 NOTE — Telephone Encounter (Signed)
Pt.notified

## 2012-11-05 NOTE — Telephone Encounter (Signed)
New Problem  Patty [name] states the pt has complained about chest pains for the past 2 days and would like for the nurse to call her back.

## 2012-11-08 ENCOUNTER — Ambulatory Visit (INDEPENDENT_AMBULATORY_CARE_PROVIDER_SITE_OTHER): Payer: Medicare Other | Admitting: Pharmacist

## 2012-11-08 ENCOUNTER — Encounter (HOSPITAL_COMMUNITY)
Admission: RE | Admit: 2012-11-08 | Discharge: 2012-11-08 | Disposition: A | Discharge status: Home / Self Care | Payer: Medicare Other | Source: Ambulatory Visit | Attending: Interventional Cardiology | Admitting: Interventional Cardiology

## 2012-11-08 ENCOUNTER — Telehealth: Payer: Self-pay | Admitting: Interventional Cardiology

## 2012-11-08 DIAGNOSIS — Z7901 Long term (current) use of anticoagulants: Secondary | ICD-10-CM | POA: Diagnosis not present

## 2012-11-08 DIAGNOSIS — Z5189 Encounter for other specified aftercare: Secondary | ICD-10-CM | POA: Diagnosis not present

## 2012-11-08 DIAGNOSIS — I2510 Atherosclerotic heart disease of native coronary artery without angina pectoris: Secondary | ICD-10-CM | POA: Diagnosis not present

## 2012-11-08 DIAGNOSIS — I4891 Unspecified atrial fibrillation: Secondary | ICD-10-CM | POA: Diagnosis not present

## 2012-11-08 DIAGNOSIS — I10 Essential (primary) hypertension: Secondary | ICD-10-CM | POA: Diagnosis not present

## 2012-11-08 DIAGNOSIS — R0789 Other chest pain: Secondary | ICD-10-CM

## 2012-11-08 DIAGNOSIS — I252 Old myocardial infarction: Secondary | ICD-10-CM | POA: Diagnosis not present

## 2012-11-08 NOTE — Telephone Encounter (Signed)
I spoke with the patient. She states she was calling back to follow up with Amy and Dr. Eldridge Dace this morning about her symptoms from last week. She reports on Friday she was in cardiac rehab and her initial SBP was 148. She did not exercise as the staff there felt they should speak with Dr. Eldridge Dace first. She reports that she developed intermittent CP on Thursday, however her BP's were normal. Pain did radiate to the should blades. She was advised to rest and call this morning with how her weekend was. Per the patient, symptoms overall subsided with improvement in her level of fatigue and radiating pain. She has continued with some intermittent chest tightness, but no worsening of symptoms. SBP prior to her meds this morning ws 142 & 137 with a HR of 80 bpm. Her concern is if Dr. Eldridge Dace feels she should participate in cardiac rehab today at 1:15 pm. I will review with Dr. Eldridge Dace and call the patient back. She is agreeable.

## 2012-11-08 NOTE — Telephone Encounter (Signed)
Follow Up:  Pt states she spoke with Amy on Friday. Pt states they spoke about her chest pain and SOB... Pt states she was told to call back on Monday. Pt states she is still having some chest pain and SOB. Pt wants to know if she can be seen ASAP

## 2012-11-08 NOTE — Telephone Encounter (Signed)
Reviewed symptoms with Dr. Eldridge Dace. OK for the patient to go to cardiac rehab today. He will order a ETT myoview to assess symptoms. I have relayed this to the patient and she is agreeable. She reports follow up BP 1 hour after her medication this morning was 108/79 HR- 71. Janice in cardiac rehab is aware Dr. Eldridge Dace has given the ok for the patient to exercise.

## 2012-11-10 ENCOUNTER — Ambulatory Visit (INDEPENDENT_AMBULATORY_CARE_PROVIDER_SITE_OTHER): Payer: Medicare Other | Admitting: Interventional Cardiology

## 2012-11-10 ENCOUNTER — Encounter: Payer: Self-pay | Admitting: Interventional Cardiology

## 2012-11-10 ENCOUNTER — Telehealth: Payer: Self-pay | Admitting: Interventional Cardiology

## 2012-11-10 ENCOUNTER — Encounter: Payer: Self-pay | Admitting: Cardiology

## 2012-11-10 ENCOUNTER — Telehealth (HOSPITAL_COMMUNITY): Payer: Self-pay | Admitting: Internal Medicine

## 2012-11-10 ENCOUNTER — Other Ambulatory Visit: Payer: Self-pay | Admitting: Interventional Cardiology

## 2012-11-10 ENCOUNTER — Encounter (HOSPITAL_COMMUNITY): Payer: Medicare Other

## 2012-11-10 VITALS — BP 122/68 | HR 84 | Ht 60.0 in | Wt 171.0 lb

## 2012-11-10 DIAGNOSIS — I2510 Atherosclerotic heart disease of native coronary artery without angina pectoris: Secondary | ICD-10-CM

## 2012-11-10 DIAGNOSIS — I4891 Unspecified atrial fibrillation: Secondary | ICD-10-CM

## 2012-11-10 DIAGNOSIS — R079 Chest pain, unspecified: Secondary | ICD-10-CM | POA: Diagnosis not present

## 2012-11-10 DIAGNOSIS — I10 Essential (primary) hypertension: Secondary | ICD-10-CM | POA: Diagnosis not present

## 2012-11-10 LAB — CBC WITH DIFFERENTIAL/PLATELET
Eosinophils Relative: 3.5 % (ref 0.0–5.0)
HCT: 37.3 % (ref 36.0–46.0)
Hemoglobin: 12.6 g/dL (ref 12.0–15.0)
Lymphs Abs: 1.5 10*3/uL (ref 0.7–4.0)
MCV: 90.1 fl (ref 78.0–100.0)
Monocytes Absolute: 0.5 10*3/uL (ref 0.1–1.0)
Monocytes Relative: 9.2 % (ref 3.0–12.0)
Neutro Abs: 3.5 10*3/uL (ref 1.4–7.7)
WBC: 5.7 10*3/uL (ref 4.5–10.5)

## 2012-11-10 LAB — BASIC METABOLIC PANEL
BUN: 21 mg/dL (ref 6–23)
CO2: 33 mEq/L — ABNORMAL HIGH (ref 19–32)
Calcium: 8.9 mg/dL (ref 8.4–10.5)
Creatinine, Ser: 0.9 mg/dL (ref 0.4–1.2)
Glucose, Bld: 120 mg/dL — ABNORMAL HIGH (ref 70–99)

## 2012-11-10 LAB — PROTIME-INR: INR: 1.7 ratio — ABNORMAL HIGH (ref 0.8–1.0)

## 2012-11-10 NOTE — Telephone Encounter (Signed)
Would plan for repeat cath vs. Stress test given fairly recent stents and recurrent pain despite multiple antianginal therapies.  If sx are similar to what she had prior to stents, would plan for cath.

## 2012-11-10 NOTE — Telephone Encounter (Signed)
Pt will come for pre-cath work-up today and she will have labwork. Pt aware that cath is scheduled for 11/12/12.

## 2012-11-10 NOTE — Patient Instructions (Addendum)
Your physician has requested that you have a cardiac catheterization. Cardiac catheterization is used to diagnose and/or treat various heart conditions. Doctors may recommend this procedure for a number of different reasons. The most common reason is to evaluate chest pain. Chest pain can be a symptom of coronary artery disease (CAD), and cardiac catheterization can show whether plaque is narrowing or blocking your heart's arteries. This procedure is also used to evaluate the valves, as well as measure the blood flow and oxygen levels in different parts of your heart. For further information please visit [email or web address] Please follow instruction sheet, as given.  Hold Coumadin until after cath and eat Greens.  Your physician recommends that you return for lab work in: today for pre-cath labs.

## 2012-11-10 NOTE — Progress Notes (Signed)
Patient ID: Patty [name], female   DOB: 10/23/1930, 79 y.o.   MRN: [id number]    [address], [address] [place], [place]  [address] Phone: [phone] Fax:  [phone]  Date:  11/10/2012   ID:  Jakayla E Garriga, DOB 02/19/1930, MRN [id number]  PCP:  OSBORNE,JAMES CHARLES, MD      History of Present Illness: Patty [name] is a 79 y.o. female  with AFib and CAD. She had multivessel rotational atherectomy and stent placement in July 2014. She started rehab without any problems. CAD/ASCVD:  HR at rehab has been anywhere from 60 at rest, and 103 after exercise. Reported Chest pain, sometimes with exertion, sometimes at rest.  Last episode was last night. It felt similar to her prior pain before the stents.  He was not as severe. Currently PAin free.  Dyspnea on exertion. . Used Nitroglycerin with relief. Denies Diaphoresis.  Diet.  Dizziness.   Exercise.  Fatigue.  Leg edema.   Orthopnea.  Palpitations.    Vital Signs      Wt Readings from Last 3 Encounters:  11/10/12 171 lb (77.565 kg)  09/30/12 173 lb (78.472 kg)  09/09/12 171 lb 15.3 oz (78 kg)     Past Medical History  Diagnosis Date  . IHD (ischemic heart disease)     Remote MI in 2000 with stent to LAD and LCX. Failed  intervention to OM in 2009. Managed medically since that time.  . Hypertension   . Hyperlipidemia   . Obesity   . Granulosa cell tumor     Has had several surgeries due to her tumor  . Diverticulitis   . Shortness of breath   . Peripheral vascular disease   . Other and unspecified angina pectoris   . MI (myocardial infarction) 06/1998    "when they were putting in the stent" (08/11/2012)  . Varicose veins   . Granulosa cell carcinoma     abd; "last episode was in 2009" (08/11/2012)  . Sleep apnea   . Dysrhythmia   . Atrial flutter   . Tachycardia-bradycardia syndrome     leadless pacemaker ([name]) implanted by Dr Allred  . Atrial fibrillation     "just developed this winter 2013"  (08/11/2012)    Current Outpatient Prescriptions  Medication Sig Dispense Refill  . atorvastatin (LIPITOR) 80 MG tablet Take 80 mg by mouth every morning.      . Calcium Carb-Cholecalciferol (CALTRATE 600+D) 600-800 MG-UNIT TABS Take by mouth every morning.      . clopidogrel (PLAVIX) 75 MG tablet Take 1 tablet (75 mg total) by mouth daily with breakfast.  30 tablet  11  . Coenzyme Q-10 100 MG capsule Take 100 mg by mouth daily.       . diphenhydrAMINE (BENADRYL) 25 MG tablet Take 25 mg by mouth every 6 (six) hours as needed for itching (and rash).      . docusate sodium (COLACE) 100 MG capsule Take 100 mg by mouth daily as needed for constipation.      . furosemide (LASIX) 40 MG tablet Take 20-40 mg by mouth 2 (two) times daily. Take 1 tablet in the morning and 1/2 tablet in the afternoon      . hydrocortisone cream 1 % Apply 1 application topically daily as needed (for rash).      . isosorbide mononitrate (IMDUR) 60 MG 24 hr tablet Take 30-60 mg by mouth 2 (two) times daily. Take 1 tablet in the morning and   1/2 tablet in the afternoon      . lisinopril (PRINIVIL,ZESTRIL) 10 MG tablet Take 5 mg by mouth 2 (two) times daily.      . metoprolol (LOPRESSOR) 100 MG tablet Take 1 tablet (100 mg total) by mouth 2 (two) times daily.  60 tablet  11  . Multiple Vitamin (MULTIVITAMIN WITH MINERALS) TABS tablet Take 1 tablet by mouth every morning.      . nitroGLYCERIN (NITROSTAT) 0.4 MG SL tablet Place 1 tablet (0.4 mg total) under the tongue every 5 (five) minutes as needed. For chest pain  35 tablet  11  . Omega-3 Fatty Acids (FISH OIL PO) Take 1,200 mg by mouth daily.       . potassium chloride SA (K-DUR,KLOR-CON) 20 MEQ tablet Take 40 mEq by mouth every morning.      . vitamin E 400 UNIT capsule Take 400 Units by mouth daily as needed (for vitamin).       . warfarin (COUMADIN) 2 MG tablet Take 1-2 mg by mouth See admin instructions. Take  1 tablet daily except take 1/2 tablet on sunday and tuesday         No current facility-administered medications for this visit.    Allergies:    Allergies  Allergen Reactions  . Bee Venom Anaphylaxis  . Adhesive [Tape]   . Amoxicillin Diarrhea and Nausea And Vomiting  . Other     Pain medications cause severe vomiting.    Social History:  The patient  reports that she quit smoking about 38 years ago. Her smoking use included Cigarettes. She has a 32 pack-year smoking history. She has never used smokeless tobacco. She reports that she drinks about 1.8 ounces of alcohol per week. She reports that she does not use illicit drugs.   Family History:  The patient's family history includes Heart attack in her father.   ROS:  Please see the history of present illness.  No nausea, vomiting.  No fevers, chills.  No focal weakness.  No dysuria. Chest pain as noted above; DOE.   All other systems reviewed and negative.   PHYSICAL EXAM: VS:  BP 122/68  Pulse 84  Ht 5' (1.524 m)  Wt 171 lb (77.565 kg)  BMI 33.4 kg/m2 Well nourished, well developed, in no acute distress HEENT: normal Neck: no JVD, no carotid bruits Cardiac:  normal S1, S2; irregularly irregular rhythm, normal rate, 2/6 systolic murmur Lungs:  clear to auscultation bilaterally, no wheezing, rhonchi or rales Abd: soft, nontender, no hepatomegaly Ext: Right leg edema, 2+ right radial pulse Skin: warm and dry Neuro:   no focal abnormalities noted     ASSESSMENT AND PLAN:  1. Angina/CAD: Worsening angina. She had an episode at rest last night. Plan for cath on Friday.  Hold Coumadin until that time.  Disccussed cath with the patient and husband and all questions were answered.  HTN:  Controlled.  COntinue current meds.   AFib, rate controlled.  HR has been controlled at rehab.  I don't think tightness is related to RVR.  Hold COumadin until cath.  Longterm will need Warfarin with at least 6 months of plavix.    Signed, Jay S. Shellee Streng, MD, FACC 11/10/2012 3:13 PM  

## 2012-11-10 NOTE — Telephone Encounter (Signed)
Pt called stating she had CP last night and she took 2 nitro which helped and she was able to go to sleep. Pt woke up this morning with tightness in her chest. Pt denies SOB. Tightness comes on exertion. Pt hasnt taken nitro today. Pt has discomfort in her shoulder blades when she has the tightness in her chest. Pt feels okay otherwise and doesn't feel she needs to go to the ER, but does feel meds may need to be adjusted. To Dr. Eldridge Dace, please advise.

## 2012-11-10 NOTE — Telephone Encounter (Signed)
Pt is having symptoms like she did prior to her heart cath. Per Dr. Eldridge Dace we should scheduled Cath.

## 2012-11-10 NOTE — Telephone Encounter (Signed)
New Problem  Pt states she experienced chest pains and took 2 nitros around 10 pm/// request to speak with a nurse on how to move forward.

## 2012-11-11 ENCOUNTER — Telehealth: Payer: Self-pay | Admitting: Interventional Cardiology

## 2012-11-11 MED ORDER — SODIUM CHLORIDE 0.9 % IV SOLN: 250.0000 mL | INTRAVENOUS | Status: DC | PRN

## 2012-11-11 MED ORDER — SODIUM CHLORIDE 0.9 % IJ SOLN: 3.0000 mL | INTRAMUSCULAR | Status: DC | PRN

## 2012-11-11 MED ORDER — DIAZEPAM 5 MG PO TABS
5.0000 mg | ORAL_TABLET | ORAL | Status: AC
  Administered 2012-11-12: 5 mg via ORAL

## 2012-11-11 MED ORDER — ACETAMINOPHEN 325 MG PO TABS: 650.0000 mg | ORAL_TABLET | ORAL | Status: DC | PRN

## 2012-11-11 MED ORDER — SODIUM CHLORIDE 0.9 % IV SOLN
INTRAVENOUS | Status: DC
Start: 2012-11-12 — End: 2012-11-12
  Administered 2012-11-12: 09:00:00 via INTRAVENOUS

## 2012-11-11 MED ORDER — SODIUM CHLORIDE 0.9 % IV SOLN: INTRAVENOUS | Status: DC

## 2012-11-11 MED ORDER — ONDANSETRON HCL 4 MG/2ML IJ SOLN: 4.0000 mg | Freq: Four times a day (QID) | INTRAMUSCULAR | Status: DC | PRN

## 2012-11-11 MED ORDER — SODIUM CHLORIDE 0.9 % IJ SOLN: 3.0000 mL | Freq: Two times a day (BID) | INTRAMUSCULAR | Status: DC

## 2012-11-11 MED ORDER — ASPIRIN 81 MG PO CHEW
81.0000 mg | CHEWABLE_TABLET | ORAL | Status: AC
  Administered 2012-11-12: 81 mg via ORAL

## 2012-11-12 ENCOUNTER — Other Ambulatory Visit: Payer: Self-pay

## 2012-11-12 ENCOUNTER — Encounter (HOSPITAL_COMMUNITY)
Admission: RE | Disposition: A | Discharge status: Home / Self Care | Payer: Self-pay | Source: Ambulatory Visit | Attending: Interventional Cardiology

## 2012-11-12 ENCOUNTER — Encounter (HOSPITAL_COMMUNITY): Admission: RE | Admit: 2012-11-12 | Payer: Medicare Other | Source: Ambulatory Visit

## 2012-11-12 ENCOUNTER — Encounter (HOSPITAL_COMMUNITY): Payer: Self-pay | Admitting: General Practice

## 2012-11-12 ENCOUNTER — Ambulatory Visit (HOSPITAL_COMMUNITY)
Admission: RE | Admit: 2012-11-12 | Discharge: 2012-11-13 | Disposition: A | Discharge status: Home / Self Care | Payer: Medicare Other | Source: Ambulatory Visit | Attending: Interventional Cardiology | Admitting: Interventional Cardiology

## 2012-11-12 DIAGNOSIS — I10 Essential (primary) hypertension: Secondary | ICD-10-CM | POA: Diagnosis not present

## 2012-11-12 DIAGNOSIS — I2510 Atherosclerotic heart disease of native coronary artery without angina pectoris: Secondary | ICD-10-CM

## 2012-11-12 DIAGNOSIS — Z950 Presence of cardiac pacemaker: Secondary | ICD-10-CM | POA: Diagnosis not present

## 2012-11-12 DIAGNOSIS — Z7901 Long term (current) use of anticoagulants: Secondary | ICD-10-CM | POA: Insufficient documentation

## 2012-11-12 DIAGNOSIS — I209 Angina pectoris, unspecified: Secondary | ICD-10-CM | POA: Diagnosis not present

## 2012-11-12 DIAGNOSIS — I4891 Unspecified atrial fibrillation: Secondary | ICD-10-CM | POA: Diagnosis not present

## 2012-11-12 DIAGNOSIS — Z23 Encounter for immunization: Secondary | ICD-10-CM | POA: Insufficient documentation

## 2012-11-12 DIAGNOSIS — Z7902 Long term (current) use of antithrombotics/antiplatelets: Secondary | ICD-10-CM | POA: Insufficient documentation

## 2012-11-12 DIAGNOSIS — Z79899 Other long term (current) drug therapy: Secondary | ICD-10-CM | POA: Diagnosis not present

## 2012-11-12 HISTORY — DX: Dependence on other enabling machines and devices: Z99.89

## 2012-11-12 HISTORY — PX: LEFT HEART CATHETERIZATION WITH CORONARY ANGIOGRAM: SHX5451

## 2012-11-12 HISTORY — DX: Obstructive sleep apnea (adult) (pediatric): G47.33

## 2012-11-12 LAB — POCT ACTIVATED CLOTTING TIME: Activated Clotting Time: 227 seconds

## 2012-11-12 SURGERY — LEFT HEART CATHETERIZATION WITH CORONARY ANGIOGRAM
Anesthesia: Moderate Sedation

## 2012-11-12 SURGERY — LEFT HEART CATHETERIZATION WITH CORONARY ANGIOGRAM
Anesthesia: Choice

## 2012-11-12 MED ORDER — ISOSORBIDE MONONITRATE ER 30 MG PO TB24
30.0000 mg | ORAL_TABLET | Freq: Every day | ORAL | Status: DC
  Filled 2012-11-12: qty 1

## 2012-11-12 MED ORDER — CLOPIDOGREL BISULFATE 75 MG PO TABS: 75.0000 mg | ORAL_TABLET | Freq: Every day | ORAL | Status: DC

## 2012-11-12 MED ORDER — WARFARIN - PHYSICIAN DOSING INPATIENT: Freq: Every day | Status: DC

## 2012-11-12 MED ORDER — ISOSORBIDE MONONITRATE ER 60 MG PO TB24
60.0000 mg | ORAL_TABLET | Freq: Every day | ORAL | Status: DC
  Administered 2012-11-13: 60 mg via ORAL
  Filled 2012-11-12 (×2): qty 1

## 2012-11-12 MED ORDER — ISOSORBIDE MONONITRATE ER 30 MG PO TB24: 30.0000 mg | ORAL_TABLET | Freq: Two times a day (BID) | ORAL | Status: DC

## 2012-11-12 MED ORDER — NITROGLYCERIN 0.4 MG SL SUBL
0.4000 mg | SUBLINGUAL_TABLET | SUBLINGUAL | Status: DC | PRN
  Administered 2012-11-13: 06:00:00 0.4 mg via SUBLINGUAL

## 2012-11-12 MED ORDER — LISINOPRIL 5 MG PO TABS
5.0000 mg | ORAL_TABLET | Freq: Two times a day (BID) | ORAL | Status: DC
  Filled 2012-11-12: qty 1

## 2012-11-12 MED ORDER — CLOPIDOGREL BISULFATE 300 MG PO TABS
ORAL_TABLET | ORAL | Status: AC
  Filled 2012-11-12: qty 1

## 2012-11-12 MED ORDER — MIDAZOLAM HCL 2 MG/2ML IJ SOLN
INTRAMUSCULAR | Status: AC
  Filled 2012-11-12: qty 2

## 2012-11-12 MED ORDER — METOPROLOL TARTRATE 100 MG PO TABS
100.0000 mg | ORAL_TABLET | Freq: Two times a day (BID) | ORAL | Status: DC
  Filled 2012-11-12: qty 1

## 2012-11-12 MED ORDER — ISOSORBIDE MONONITRATE ER 30 MG PO TB24
30.0000 mg | ORAL_TABLET | ORAL | Status: AC
  Administered 2012-11-12: 30 mg via ORAL

## 2012-11-12 MED ORDER — METOPROLOL TARTRATE 100 MG PO TABS
100.0000 mg | ORAL_TABLET | Freq: Two times a day (BID) | ORAL | Status: DC
  Administered 2012-11-13: 100 mg via ORAL
  Filled 2012-11-12 (×2): qty 1
  Filled 2012-11-12: qty 4

## 2012-11-12 MED ORDER — WARFARIN SODIUM 1 MG PO TABS: 1.0000 mg | ORAL_TABLET | ORAL | Status: DC

## 2012-11-12 MED ORDER — ACETAMINOPHEN 325 MG PO TABS: 650.0000 mg | ORAL_TABLET | ORAL | Status: DC | PRN

## 2012-11-12 MED ORDER — DIPHENHYDRAMINE HCL 25 MG PO TABS
25.0000 mg | ORAL_TABLET | Freq: Four times a day (QID) | ORAL | Status: DC | PRN
  Filled 2012-11-12: qty 1

## 2012-11-12 MED ORDER — LISINOPRIL 5 MG PO TABS
5.0000 mg | ORAL_TABLET | ORAL | Status: AC
  Administered 2012-11-12: 5 mg via ORAL

## 2012-11-12 MED ORDER — ATORVASTATIN CALCIUM 80 MG PO TABS
80.0000 mg | ORAL_TABLET | Freq: Every day | ORAL | Status: DC
  Filled 2012-11-12: qty 1

## 2012-11-12 MED ORDER — ONDANSETRON HCL 4 MG/2ML IJ SOLN: 4.0000 mg | Freq: Four times a day (QID) | INTRAMUSCULAR | Status: DC | PRN

## 2012-11-12 MED ORDER — CLOPIDOGREL BISULFATE 75 MG PO TABS
75.0000 mg | ORAL_TABLET | Freq: Every day | ORAL | Status: DC
  Administered 2012-11-13: 09:00:00 75 mg via ORAL
  Filled 2012-11-12: qty 1

## 2012-11-12 MED ORDER — HYDROCORTISONE 1 % EX CREA
1.0000 "application " | TOPICAL_CREAM | Freq: Every day | CUTANEOUS | Status: DC | PRN
  Filled 2012-11-12: qty 28

## 2012-11-12 MED ORDER — SODIUM CHLORIDE 0.9 % IV SOLN: INTRAVENOUS | Status: AC

## 2012-11-12 MED ORDER — LIDOCAINE HCL (PF) 1 % IJ SOLN
INTRAMUSCULAR | Status: AC
  Filled 2012-11-12: qty 30

## 2012-11-12 MED ORDER — INFLUENZA VAC SPLIT QUAD 0.5 ML IM SUSP
0.5000 mL | INTRAMUSCULAR | Status: AC
  Administered 2012-11-13: 09:00:00 0.5 mL via INTRAMUSCULAR
  Filled 2012-11-12: qty 0.5

## 2012-11-12 MED ORDER — DOCUSATE SODIUM 100 MG PO CAPS
100.0000 mg | ORAL_CAPSULE | Freq: Every day | ORAL | Status: DC | PRN
  Filled 2012-11-12: qty 1

## 2012-11-12 MED ORDER — POTASSIUM CHLORIDE CRYS ER 20 MEQ PO TBCR
40.0000 meq | EXTENDED_RELEASE_TABLET | Freq: Every morning | ORAL | Status: DC
  Administered 2012-11-13: 09:00:00 40 meq via ORAL
  Filled 2012-11-12: qty 2

## 2012-11-12 MED ORDER — WARFARIN SODIUM 2 MG PO TABS
2.0000 mg | ORAL_TABLET | ORAL | Status: DC
  Administered 2012-11-12: 2 mg via ORAL
  Filled 2012-11-12 (×2): qty 1

## 2012-11-12 MED ORDER — HEPARIN (PORCINE) IN NACL 2-0.9 UNIT/ML-% IJ SOLN
INTRAMUSCULAR | Status: AC
  Filled 2012-11-12: qty 1000

## 2012-11-12 MED ORDER — HEPARIN SODIUM (PORCINE) 1000 UNIT/ML IJ SOLN
INTRAMUSCULAR | Status: AC
  Filled 2012-11-12: qty 1

## 2012-11-12 MED ORDER — METOPROLOL TARTRATE 100 MG PO TABS
100.0000 mg | ORAL_TABLET | ORAL | Status: AC
  Administered 2012-11-12: 100 mg via ORAL

## 2012-11-12 MED ORDER — NITROGLYCERIN 0.2 MG/ML ON CALL CATH LAB
INTRAVENOUS | Status: AC
  Filled 2012-11-12: qty 1

## 2012-11-12 MED ORDER — LISINOPRIL 5 MG PO TABS
5.0000 mg | ORAL_TABLET | Freq: Two times a day (BID) | ORAL | Status: DC
  Administered 2012-11-13: 09:00:00 5 mg via ORAL
  Filled 2012-11-12 (×3): qty 1

## 2012-11-12 MED ORDER — VITAMIN E 180 MG (400 UNIT) PO CAPS
400.0000 [IU] | ORAL_CAPSULE | Freq: Every day | ORAL | Status: DC | PRN
  Filled 2012-11-12: qty 1

## 2012-11-12 MED ORDER — FENTANYL CITRATE 0.05 MG/ML IJ SOLN
INTRAMUSCULAR | Status: AC
  Filled 2012-11-12: qty 2

## 2012-11-12 MED ORDER — ADULT MULTIVITAMIN W/MINERALS CH
1.0000 | ORAL_TABLET | Freq: Every morning | ORAL | Status: DC
  Administered 2012-11-13: 10:00:00 1 via ORAL
  Filled 2012-11-12: qty 1

## 2012-11-12 NOTE — Interval H&P Note (Signed)
History and Physical Interval Note:  11/12/2012 9:02 AM  Patty [name]  has presented today for surgery, with the diagnosis of angina, CAD  The various methods of treatment have been discussed with the patient and family. After consideration of risks, benefits and other options for treatment, the patient has consented to  Procedure(s): LEFT HEART CATHETERIZATION WITH CORONARY ANGIOGRAM (N/A) as a surgical intervention .  The patient's history has been reviewed, patient examined, no change in status, stable for surgery.  I have reviewed the patient's chart and labs.  Questions were answered to the patient's satisfaction.     Sinda Leedom S.

## 2012-11-12 NOTE — Progress Notes (Signed)
Brief Nutrition Note:  RD pulled to chart for positive malnutrition screening tool.  Pt follows weight watchers and sees the RD at cardiac rehab. Has lost weight, but it was intentional. Appetite has been WNL.   Wt Readings from Last 5 Encounters:  11/12/12 170 lb (77.111 kg)  11/12/12 170 lb (77.111 kg)  11/12/12 170 lb (77.111 kg)  11/10/12 171 lb (77.565 kg)  09/30/12 173 lb (78.472 kg)   Body mass index is 33.2 kg/(m^2). obesity class 1. Some weight loss is appropriate for this pt.   Chart reviewed, no nutrition needs at this time. Please consult as needed.   Isabell Jarvis RD, LDN Pager [phone] After Hours pager [phone]

## 2012-11-12 NOTE — CV Procedure (Signed)
PROCEDURE:  Left heart catheterization with selective coronary angiography, left ventriculogram. PCI proximal circumflex.  INDICATIONS:  Class III angina despite 2 antianginal meds  The risks, benefits, and details of the procedure were explained to the patient.  The patient verbalized understanding and wanted to proceed.  Informed written consent was obtained.  PROCEDURE TECHNIQUE:  After Xylocaine anesthesia a 39F slender sheath was placed in the right radial artery with a single anterior needle wall stick.  Weight-based heparin was given after accessing the ascending aorta. Right coronary angiography was done using an AR2 guide catheter.  Left coronary angiography was done using a Judkins L4 guide catheter.  Left ventriculography was done using a pigtail catheter.  A TR band was used for hemostasis.   CONTRAST:  Total of 150 cc.  COMPLICATIONS:  None.    HEMODYNAMICS:  Aortic pressure was 122/53; LV pressure was 121/5; LVEDP 21.  There was no gradient between the left ventricle and aorta.    ANGIOGRAPHIC DATA:   The left main coronary artery is widely patent.  The left anterior descending artery is a large vessel with mild to moderate proximal disease. The stents in the proximal to mid vessel are widely patent with only minimal in-stent restenosis. The mid vessel, there is a 40% lesion at the origin of a small diagonal. The mid to distal LAD is large and widely patent with only mild atherosclerosis. There are 3 small diagonal vessels which are patent.  The left circumflex artery is a large vessel. In the proximal portion, there is a focal 80% stenosis just before the origin of the recently placed stents. The stents in the proximal to mid vessel are widely patent. There is moderate diffuse disease at the bifurcation of the OM1. The OM1 is also moderately diseased. The distal circumflex system has moderate disease.  The right coronary artery is a large dominant vessel. There is a  slightly irregular take off of the RCA requiring an AR to be used. In the mid vessel, there is mild to moderate calcific disease. In the distal vessel, it is noted that the posterior lateral artery is medium size and moderately diseased diffusely. The posterior descending artery is also medium size with moderate diffuse disease.  LEFT VENTRICULOGRAM:  Left ventricular angiogram was done in the 30 RAO projection and revealed mildly decreased systolic function globally with an estimated ejection fraction of 40 %.  LVEDP was 21 mmHg.  2+ mitral regurgitation.  PCI NARRATIVE: A CLS 3.5 guiding catheter was used to engage the left main. Additional heparin was given for anticoagulation. An ACT was use checked at the heparin was therapeutic. Pro-water wire was placed across the area disease in the proximal circumflex. A 2.5 x 10 cutting balloon was used to predilate the proximal circumflex. The 3.0 x 8 promus drug-eluting stent was then carefully placed in the proximal circumflex such that it did cover up to the ostial circumflex. The stent was deployed. A 3.5 x 6 euphoria noncompliant balloon was used to post dilate the stent. This was inflated to 18 atmospheres. There is no residual stenosis. There is an excellent vein graft result. TIMI-3 flow was maintained throughout.  IMPRESSIONS:  1. Normal left main coronary artery. 2. Patent stents in the left anterior descending artery. 3. Patent stents in the proximal to mid left circumflex artery.  80% proximal lesion outside of the stented area. This was the culprit for her symptoms and this was successfully treated with a 3.0  x 8 promise drug-eluting stent postdilated to 3.5 mm in diameter.  Moderate small vessel disease in the circumflex branches. 4. Mild to moderate diffuse disease in the right coronary artery . 5. Mildly decreased left ventricular systolic function.  Improved LV function compared to the July ventriculogram. LVEDP 21 mmHg.  Ejection fraction 40  %.  RECOMMENDATION:  Continue Plavix and restart Coumadin tonight. No aspirin since she is on 2 blood thinners already. We'll try to wean off nitrates. May need to keep some long-acting nitrates on board due to her small vessel disease. I would anticipate discharge tomorrow assuming no complications.Marland Kitchen

## 2012-11-12 NOTE — H&P (View-Only) (Signed)
Patient ID: Aalaya Yadao Zeringue, female   DOB: 1930-02-27, 79 y.o.   MRN: [id number]    [address] [place], [place]  [address] Phone: [phone] Fax:  [phone]  Date:  11/10/2012   ID:  Patty [name], DOB Sep 16, 1930, MRN [id number]  PCP:  Darnelle Bos, MD      History of Present Illness: Patty [name] is a 79 y.o. female  with AFib and CAD. She had multivessel rotational atherectomy and stent placement in July 2014. She started rehab without any problems. CAD/ASCVD:  HR at rehab has been anywhere from 60 at rest, and 103 after exercise. Reported Chest pain, sometimes with exertion, sometimes at rest.  Last episode was last night. It felt similar to her prior pain before the stents.  He was not as severe. Currently PAin free.  Dyspnea on exertion. . Used Nitroglycerin with relief. Denies Diaphoresis.  Diet.  Dizziness.   Exercise.  Fatigue.  Leg edema.   Orthopnea.  Palpitations.    Vital Signs      Wt Readings from Last 3 Encounters:  11/10/12 171 lb (77.565 kg)  09/30/12 173 lb (78.472 kg)  09/09/12 171 lb 15.3 oz (78 kg)     Past Medical History  Diagnosis Date  . IHD (ischemic heart disease)     Remote MI in 2000 with stent to LAD and LCX. Failed  intervention to OM in 2009. Managed medically since that time.  . Hypertension   . Hyperlipidemia   . Obesity   . Granulosa cell tumor     Has had several surgeries due to her tumor  . Diverticulitis   . Shortness of breath   . Peripheral vascular disease   . Other and unspecified angina pectoris   . MI (myocardial infarction) 06/1998    "when they were putting in the stent" (08/11/2012)  . Varicose veins   . Granulosa cell carcinoma     abd; "last episode was in 2009" (08/11/2012)  . Sleep apnea   . Dysrhythmia   . Atrial flutter   . Tachycardia-bradycardia syndrome     leadless pacemaker ([name]) implanted by Dr Johney Frame  . Atrial fibrillation     "just developed this winter 2013"  (08/11/2012)    Current Outpatient Prescriptions  Medication Sig Dispense Refill  . atorvastatin (LIPITOR) 80 MG tablet Take 80 mg by mouth every morning.      . Calcium Carb-Cholecalciferol (CALTRATE 600+D) 600-800 MG-UNIT TABS Take by mouth every morning.      . clopidogrel (PLAVIX) 75 MG tablet Take 1 tablet (75 mg total) by mouth daily with breakfast.  30 tablet  11  . Coenzyme Q-10 100 MG capsule Take 100 mg by mouth daily.       . diphenhydrAMINE (BENADRYL) 25 MG tablet Take 25 mg by mouth every 6 (six) hours as needed for itching (and rash).      Marland Kitchen docusate sodium (COLACE) 100 MG capsule Take 100 mg by mouth daily as needed for constipation.      . furosemide (LASIX) 40 MG tablet Take 20-40 mg by mouth 2 (two) times daily. Take 1 tablet in the morning and 1/2 tablet in the afternoon      . hydrocortisone cream 1 % Apply 1 application topically daily as needed (for rash).      . isosorbide mononitrate (IMDUR) 60 MG 24 hr tablet Take 30-60 mg by mouth 2 (two) times daily. Take 1 tablet in the morning and  1/2 tablet in the afternoon      . lisinopril (PRINIVIL,ZESTRIL) 10 MG tablet Take 5 mg by mouth 2 (two) times daily.      . metoprolol (LOPRESSOR) 100 MG tablet Take 1 tablet (100 mg total) by mouth 2 (two) times daily.  60 tablet  11  . Multiple Vitamin (MULTIVITAMIN WITH MINERALS) TABS tablet Take 1 tablet by mouth every morning.      . nitroGLYCERIN (NITROSTAT) 0.4 MG SL tablet Place 1 tablet (0.4 mg total) under the tongue every 5 (five) minutes as needed. For chest pain  35 tablet  11  . Omega-3 Fatty Acids (FISH OIL PO) Take 1,200 mg by mouth daily.       . potassium chloride SA (K-DUR,KLOR-CON) 20 MEQ tablet Take 40 mEq by mouth every morning.      . vitamin E 400 UNIT capsule Take 400 Units by mouth daily as needed (for vitamin).       Marland Kitchen warfarin (COUMADIN) 2 MG tablet Take 1-2 mg by mouth See admin instructions. Take  1 tablet daily except take 1/2 tablet on sunday and tuesday         No current facility-administered medications for this visit.    Allergies:    Allergies  Allergen Reactions  . Bee Venom Anaphylaxis  . Adhesive [Tape]   . Amoxicillin Diarrhea and Nausea And Vomiting  . Other     Pain medications cause severe vomiting.    Social History:  The patient  reports that she quit smoking about 38 years ago. Her smoking use included Cigarettes. She has a 32 pack-year smoking history. She has never used smokeless tobacco. She reports that she drinks about 1.8 ounces of alcohol per week. She reports that she does not use illicit drugs.   Family History:  The patient's family history includes Heart attack in her father.   ROS:  Please see the history of present illness.  No nausea, vomiting.  No fevers, chills.  No focal weakness.  No dysuria. Chest pain as noted above; DOE.   All other systems reviewed and negative.   PHYSICAL EXAM: VS:  BP 122/68  Pulse 84  Ht 5' (1.524 m)  Wt 171 lb (77.565 kg)  BMI 33.4 kg/m2 Well nourished, well developed, in no acute distress HEENT: normal Neck: no JVD, no carotid bruits Cardiac:  normal S1, S2; irregularly irregular rhythm, normal rate, 2/6 systolic murmur Lungs:  clear to auscultation bilaterally, no wheezing, rhonchi or rales Abd: soft, nontender, no hepatomegaly Ext: Right leg edema, 2+ right radial pulse Skin: warm and dry Neuro:   no focal abnormalities noted     ASSESSMENT AND PLAN:  1. Angina/CAD: Worsening angina. She had an episode at rest last night. Plan for cath on Friday.  Hold Coumadin until that time.  Disccussed cath with the patient and husband and all questions were answered.  HTN:  Controlled.  COntinue current meds.   AFib, rate controlled.  HR has been controlled at rehab.  I don't think tightness is related to RVR.  Hold COumadin until cath.  Longterm will need Warfarin with at least 6 months of plavix.    Signed, Fredric Mare, MD, [facility] 11/10/2012 3:13 PM

## 2012-11-12 NOTE — Interval H&P Note (Signed)
History and Physical Interval Note:  11/12/2012 9:03 AM  Patty [name]  has presented today for surgery, with the diagnosis of angina, CAD  The various methods of treatment have been discussed with the patient and family. After consideration of risks, benefits and other options for treatment, the patient has consented to  Procedure(s): LEFT HEART CATHETERIZATION WITH CORONARY ANGIOGRAM (N/A) as a surgical intervention .  The patient's history has been reviewed, patient examined, no change in status, stable for surgery.  I have reviewed the patient's chart and labs.  Questions were answered to the patient's satisfaction.     Darnette Lampron S.  Cath Lab Visit (complete for each Cath Lab visit)  Clinical Evaluation Leading to the Procedure:   ACS: no  Non-ACS:    Anginal Classification: CCS III  Anti-ischemic medical therapy: Maximal Therapy (2 or more classes of medications)  Non-Invasive Test Results: No non-invasive testing performed  Prior CABG: No previous CABG

## 2012-11-13 DIAGNOSIS — Z23 Encounter for immunization: Secondary | ICD-10-CM | POA: Diagnosis not present

## 2012-11-13 DIAGNOSIS — I10 Essential (primary) hypertension: Secondary | ICD-10-CM | POA: Diagnosis not present

## 2012-11-13 DIAGNOSIS — I209 Angina pectoris, unspecified: Secondary | ICD-10-CM | POA: Diagnosis not present

## 2012-11-13 DIAGNOSIS — I4891 Unspecified atrial fibrillation: Secondary | ICD-10-CM | POA: Diagnosis not present

## 2012-11-13 DIAGNOSIS — I2510 Atherosclerotic heart disease of native coronary artery without angina pectoris: Secondary | ICD-10-CM | POA: Diagnosis not present

## 2012-11-13 DIAGNOSIS — Z950 Presence of cardiac pacemaker: Secondary | ICD-10-CM | POA: Diagnosis not present

## 2012-11-13 LAB — CBC
Hemoglobin: 12 g/dL (ref 12.0–15.0)
MCH: 30.3 pg (ref 26.0–34.0)
MCHC: 33.4 g/dL (ref 30.0–36.0)
RBC: 3.96 MIL/uL (ref 3.87–5.11)
WBC: 5.2 10*3/uL (ref 4.0–10.5)

## 2012-11-13 LAB — BASIC METABOLIC PANEL
CO2: 29 mEq/L (ref 19–32)
Calcium: 8.5 mg/dL (ref 8.4–10.5)
Chloride: 106 mEq/L (ref 96–112)
Glucose, Bld: 94 mg/dL (ref 70–99)
Potassium: 3.2 mEq/L — ABNORMAL LOW (ref 3.5–5.1)
Sodium: 143 mEq/L (ref 135–145)

## 2012-11-13 LAB — PROTIME-INR
INR: 1.21 (ref 0.00–1.49)
Prothrombin Time: 15 seconds (ref 11.6–15.2)

## 2012-11-13 MED ORDER — DILTIAZEM HCL ER COATED BEADS 120 MG PO CP24
120.0000 mg | ORAL_CAPSULE | Freq: Every day | ORAL | Status: DC
  Administered 2012-11-13: 09:00:00 120 mg via ORAL
  Filled 2012-11-13: qty 1

## 2012-11-13 MED ORDER — DILTIAZEM HCL ER COATED BEADS 120 MG PO CP24: 120.0000 mg | ORAL_CAPSULE | Freq: Every day | ORAL | Status: DC

## 2012-11-13 NOTE — Progress Notes (Signed)
CARDIAC REHAB PHASE I   PRE:  Rate/Rhythm: 93 afib  BP:  Supine:   Sitting: 130/47  Standing:    SaO2: 95% ra  MODE:  Ambulation: 300 ft   POST:  Rate/Rhythem: 102 afib  BP:  Supine:   Sitting: 156/92  Standing:    SaO2: 95% ra  805-833 Pt ambulated in hallway without difficulty, steady gait.  Pt c/o fatigue and slight chest "twinge" at end of ambulation.  Pt hypertensive post ambulation.  RN aware.  Pt education reinforced.  Pt instructed we will contact her when to return to [facility] once Dr. Eldridge Dace gives clearance.  Pt instructed to participate in light walking at home.  Pt instructed when to call MD.  Understanding verbalized.  [place], [employer]

## 2012-11-13 NOTE — Discharge Summary (Signed)
Patient ID: MARGERIE FRAISER MRN: [id number] DOB/AGE: 1930/11/14 79 y.o.  Admit date: 11/12/2012 Discharge date: 11/13/2012  Primary Discharge Diagnosis Angina Secondary Discharge Diagnosis CAD, AFib, HTN  Significant Diagnostic Studies: angiography: cardiac cath placement with DES to the proximal circumflex, 3.0 x 8, postdilated to 3.5 mm  Consults: None  Hospital Course: 79 y/o woman with CAD and AFib, s/p pacer who has had angina with minimal exertion, Class III sx.  She had a cardiac cath and had the above intervention.  She tolerated the procedure well.  She had some HTN and increased HR while in teh hospital.  Due to this, Cardizem was added back for better BP control and HR control.  No bleeding issues at her wrist.  Since her INR was only 1.2 at the time of d/c, we elected to give a single dose of 4 mg of COumadin on the day of discharge and then resume her usual regimen.     Discharge Exam: Blood pressure 153/96, pulse 73, temperature 98.1 F (36.7 C), temperature source Oral, resp. rate 18, height 5' (1.524 m), weight 170 lb 6.7 oz (77.3 kg), SpO2 95.00%.   [place]/AT Irregularly irregular, S1S2 No wheezing Soft, NT No edema 2+ right radial pulse, mild bruising at the site Labs:   Lab Results  Component Value Date   WBC 5.2 11/13/2012   HGB 12.0 11/13/2012   HCT 35.9* 11/13/2012   MCV 90.7 11/13/2012   PLT 166 11/13/2012    Recent Labs Lab 11/10/12 1549  NA 146*  K 3.8  CL 104  CO2 33*  BUN 21  CREATININE 0.9  CALCIUM 8.9  GLUCOSE 120*   Lab Results  Component Value Date   CKTOTAL 70 06/08/2010   CKMB 2.6 06/08/2010   TROPONINI 0.81* 03/14/2012    Lab Results  Component Value Date   CHOL 134 10/03/2010   CHOL 111 06/08/2010   CHOL  Value: 121        ATP III CLASSIFICATION:  <200     mg/dL   Desirable  782-956  mg/dL   Borderline High  >=213    mg/dL   High        0/86/5784   Lab Results  Component Value Date   HDL 62.80 10/03/2010   HDL 53 06/08/2010   HDL 56  6/96/2952   Lab Results  Component Value Date   LDLCALC 58 10/03/2010   LDLCALC  Value: 49        Total Cholesterol/HDL:CHD Risk Coronary Heart Disease Risk Table                     Men   Women  1/2 Average Risk   3.4   3.3  Average Risk       5.0   4.4  2 X Average Risk   9.6   7.1  3 X Average Risk  23.4   11.0        Use the calculated Patient Ratio above and the CHD Risk Table to determine the patient's CHD Risk.        ATP III CLASSIFICATION (LDL):  <100     mg/dL   Optimal  841-324  mg/dL   Near or Above                    Optimal  130-159  mg/dL   Borderline  401-027  mg/dL   High  >253     mg/dL   Very  High 06/08/2010   LDLCALC  Value: 52        Total Cholesterol/HDL:CHD Risk Coronary Heart Disease Risk Table                     Men   Women  1/2 Average Risk   3.4   3.3  Average Risk       5.0   4.4  2 X Average Risk   9.6   7.1  3 X Average Risk  23.4   11.0        Use the calculated Patient Ratio above and the CHD Risk Table to determine the patient's CHD Risk.        ATP III CLASSIFICATION (LDL):  <100     mg/dL   Optimal  161-096  mg/dL   Near or Above                    Optimal  130-159  mg/dL   Borderline  045-409  mg/dL   High  >811     mg/dL   Very High 10/18/7827   Lab Results  Component Value Date   TRIG 66.0 10/03/2010   TRIG 43 06/08/2010   TRIG 63 07/19/2009   Lab Results  Component Value Date   CHOLHDL 2 10/03/2010   CHOLHDL 2.1 06/08/2010   CHOLHDL 2.2 07/19/2009   No results found for this basename: LDLDIRECT      EKG: AFib, LBBB, paced beats  FOLLOW UP PLANS AND APPOINTMENTS  Future Appointments Provider Department Dept Phone   11/15/2012 1:15 PM Mc-Phase2 Monitor 1 [facility] CARDI[facility] [phone]   11/17/2012 1:15 PM Mc-Phase2 Monitor 1 [facility] C[facility] [phone]   11/19/2012 1:15 PM Mc-Phase2 Monitor 1 [facility]ARD[facility] [phone]   11/22/2012 1:15 PM Mc-Phase2 Monitor 1 [facility]ARDI[facility] [phone]   11/24/2012 9:30 AM Dava Najjar [facility] [phone]   11/24/2012 10:00 AM [facility] Covering Provider 1 [place] [facility] [phone]   11/24/2012 1:15 PM Mc-Phase2 Monitor 1 [facility] CARD[facility] [phone]   11/26/2012 1:15 PM Mc-Phase2 Monitor 1 [facility] CARDI[facility] [phone]   11/29/2012 1:15 PM Mc-Phase2 Monitor 1 [facility] CARDIAC [facility] [phone]   12/01/2012 1:15 PM Mc-Phase2 Monitor 1 [facility] CARDIAC [facility] [phone]   12/03/2012 1:15 PM Mc-Phase2 Monitor 1 [facility] CARDIAC [facility] [phone]   12/06/2012 1:15 PM Mc-Phase2 Monitor 1 [facility] CARDI[facility] [phone]   12/08/2012 12:30 PM [facility] [place] Office [phone]   12/08/2012 1:15 PM Mc-Phase2 Monitor 1 [name] CARDIAC [facility] [phone]   12/08/2012 3:00 PM Everette Rank, MD [facility] [phone]   12/10/2012 1:15 PM Mc-Phase2 Monitor 1 [facility] CARDIAC [facility] [phone]   12/13/2012 1:15 PM Mc-Phase2 Monitor 1 [facility] CARDIAC [facility] [phone]   12/15/2012 1:15 PM Mc-Phase2 Monitor 1 [facility] CARDIAC [facility] [phone]   12/17/2012 1:15 PM Mc-Phase2 Monitor 1 [facility] CARDIAC REHAB [phone]       Medication List         atorvastatin 80 MG tablet  Commonly known as:  LIPITOR  Take 80 mg by mouth every morning.     CALTRATE 600+D 600-800 MG-UNIT Tabs  Generic drug:  Calcium Carb-Cholecalciferol  Take by mouth every morning.     clopidogrel 75 MG tablet  Commonly known as:  PLAVIX  Take 1 tablet (75 mg total) by mouth daily with breakfast.     Coenzyme Q-10 100 MG capsule  Take 100 mg by mouth daily.     diltiazem 120 MG  24 hr capsule  Commonly known as:  CARDIZEM CD  Take 1 capsule (120 mg total) by mouth daily.     diphenhydrAMINE 25 MG tablet  Commonly known as:  BENADRYL  Take 25 mg by mouth every 6 (six) hours as needed for itching (and rash).     docusate sodium 100 MG capsule  Commonly known as:  COLACE  Take 100 mg by mouth daily as needed for constipation.     FISH OIL PO  Take 1,200 mg by mouth daily.     furosemide 40 MG tablet  Commonly known as:  LASIX  Take 20-40 mg by mouth 2 (two) times daily. Take 1 tablet in the morning and 1/2 tablet in the afternoon     hydrocortisone cream 1 %  Apply 1 application topically daily as needed (for rash).     isosorbide mononitrate 60 MG 24 hr tablet  Commonly known as:  IMDUR  Take 30-60 mg by mouth 2 (two) times daily. Take 1 tablet in the morning and 1/2 tablet in the afternoon     lisinopril 10 MG tablet  Commonly known as:  PRINIVIL,ZESTRIL  Take 5 mg by mouth 2 (two) times daily.     metoprolol 100 MG tablet  Commonly known as:  LOPRESSOR  Take 1 tablet (100 mg total) by mouth 2 (two) times daily.     multivitamin with minerals Tabs tablet  Take 1 tablet by mouth every morning.     nitroGLYCERIN 0.4 MG SL tablet  Commonly known as:  NITROSTAT  Place 1 tablet (0.4 mg total) under the tongue every 5 (five) minutes as needed. For chest pain     potassium chloride SA 20 MEQ tablet  Commonly known as:  K-DUR,KLOR-CON  Take 40 mEq by mouth every morning.     vitamin E 400 UNIT capsule  Take 400 Units by mouth daily as needed (for vitamin).     warfarin 2 MG tablet  Commonly known as:  COUMADIN  Take 1-2 mg by mouth See admin instructions. Take  1 tablet daily except take 1/2 tablet on sunday and tuesday           Follow-up Information   Follow up with Corky Crafts., MD In 2 weeks.   Specialty:  Cardiology   Contact information:   [address] [place] [address] [phone]       BRING ALL  MEDICATIONS WITH YOU TO FOLLOW UP APPOINTMENTS  Time spent with patient to include physician time:28 minutes Signed: Auda Finfrock S. 11/13/2012, 8:09 AM

## 2012-11-15 ENCOUNTER — Encounter (HOSPITAL_COMMUNITY): Payer: Medicare Other

## 2012-11-17 ENCOUNTER — Encounter (HOSPITAL_COMMUNITY): Payer: Medicare Other

## 2012-11-18 NOTE — Telephone Encounter (Signed)
New problem:  Pt states she would like to return to her cardiac rehab class tomorrow. Pt states she needs a letter from the doctor in order for that to happen. Pt states she is ready to return to class. Please advise

## 2012-11-18 NOTE — Telephone Encounter (Signed)
Ok for cardiac rehab 

## 2012-11-18 NOTE — Telephone Encounter (Signed)
Pt notified. Sent to Cardiac Rehab.

## 2012-11-19 ENCOUNTER — Encounter (HOSPITAL_COMMUNITY)
Admission: RE | Admit: 2012-11-19 | Discharge: 2012-11-19 | Disposition: A | Discharge status: Home / Self Care | Payer: Medicare Other | Source: Ambulatory Visit | Attending: Interventional Cardiology | Admitting: Interventional Cardiology

## 2012-11-19 ENCOUNTER — Other Ambulatory Visit: Payer: Self-pay | Admitting: Cardiology

## 2012-11-19 DIAGNOSIS — I10 Essential (primary) hypertension: Secondary | ICD-10-CM | POA: Diagnosis not present

## 2012-11-19 DIAGNOSIS — I252 Old myocardial infarction: Secondary | ICD-10-CM | POA: Diagnosis not present

## 2012-11-19 DIAGNOSIS — Z7901 Long term (current) use of anticoagulants: Secondary | ICD-10-CM | POA: Diagnosis not present

## 2012-11-19 DIAGNOSIS — I2510 Atherosclerotic heart disease of native coronary artery without angina pectoris: Secondary | ICD-10-CM | POA: Diagnosis not present

## 2012-11-19 DIAGNOSIS — I4891 Unspecified atrial fibrillation: Secondary | ICD-10-CM | POA: Diagnosis not present

## 2012-11-19 DIAGNOSIS — Z5189 Encounter for other specified aftercare: Secondary | ICD-10-CM | POA: Diagnosis not present

## 2012-11-19 MED ORDER — METOPROLOL TARTRATE 100 MG PO TABS: 100.0000 mg | ORAL_TABLET | Freq: Two times a day (BID) | ORAL | Status: DC

## 2012-11-19 MED ORDER — WARFARIN SODIUM 2 MG PO TABS: 1.0000 mg | ORAL_TABLET | ORAL | Status: DC

## 2012-11-19 MED ORDER — FUROSEMIDE 40 MG PO TABS: 20.0000 mg | ORAL_TABLET | Freq: Two times a day (BID) | ORAL | Status: DC

## 2012-11-19 MED ORDER — ISOSORBIDE MONONITRATE ER 60 MG PO TB24: 30.0000 mg | ORAL_TABLET | Freq: Two times a day (BID) | ORAL | Status: DC

## 2012-11-19 MED ORDER — LISINOPRIL 10 MG PO TABS: 5.0000 mg | ORAL_TABLET | Freq: Two times a day (BID) | ORAL | Status: DC

## 2012-11-19 MED ORDER — ATORVASTATIN CALCIUM 80 MG PO TABS: 80.0000 mg | ORAL_TABLET | Freq: Every morning | ORAL | Status: DC

## 2012-11-19 NOTE — Progress Notes (Signed)
Pt returned to cardiac rehab today after DES placement.  Pt participated in light exercise without difficulty.asymptomatic.   Pt states she started diltiazem 120mg  once daily. Med list reconciled.

## 2012-11-22 ENCOUNTER — Encounter (HOSPITAL_COMMUNITY)
Admission: RE | Admit: 2012-11-22 | Discharge: 2012-11-22 | Disposition: A | Discharge status: Home / Self Care | Payer: Medicare Other | Source: Ambulatory Visit | Attending: Interventional Cardiology | Admitting: Interventional Cardiology

## 2012-11-22 DIAGNOSIS — Z5189 Encounter for other specified aftercare: Secondary | ICD-10-CM | POA: Diagnosis not present

## 2012-11-22 DIAGNOSIS — I252 Old myocardial infarction: Secondary | ICD-10-CM | POA: Diagnosis not present

## 2012-11-22 DIAGNOSIS — Z7901 Long term (current) use of anticoagulants: Secondary | ICD-10-CM | POA: Diagnosis not present

## 2012-11-22 DIAGNOSIS — I2510 Atherosclerotic heart disease of native coronary artery without angina pectoris: Secondary | ICD-10-CM | POA: Diagnosis not present

## 2012-11-22 DIAGNOSIS — I4891 Unspecified atrial fibrillation: Secondary | ICD-10-CM | POA: Diagnosis not present

## 2012-11-22 DIAGNOSIS — I10 Essential (primary) hypertension: Secondary | ICD-10-CM | POA: Diagnosis not present

## 2012-11-24 ENCOUNTER — Encounter (HOSPITAL_COMMUNITY)
Admission: RE | Admit: 2012-11-24 | Discharge: 2012-11-24 | Disposition: A | Discharge status: Home / Self Care | Payer: Medicare Other | Source: Ambulatory Visit | Attending: Interventional Cardiology | Admitting: Interventional Cardiology

## 2012-11-24 ENCOUNTER — Other Ambulatory Visit (HOSPITAL_BASED_OUTPATIENT_CLINIC_OR_DEPARTMENT_OTHER): Payer: Medicare Other | Admitting: Lab

## 2012-11-24 ENCOUNTER — Other Ambulatory Visit: Payer: Self-pay | Admitting: Internal Medicine

## 2012-11-24 ENCOUNTER — Ambulatory Visit (HOSPITAL_BASED_OUTPATIENT_CLINIC_OR_DEPARTMENT_OTHER): Payer: Medicare Other | Admitting: Internal Medicine

## 2012-11-24 VITALS — BP 138/76 | HR 60 | Temp 97.0°F | Resp 20 | Ht 60.0 in | Wt 173.5 lb

## 2012-11-24 DIAGNOSIS — D3910 Neoplasm of uncertain behavior of unspecified ovary: Secondary | ICD-10-CM

## 2012-11-24 DIAGNOSIS — R7401 Elevation of levels of liver transaminase levels: Secondary | ICD-10-CM

## 2012-11-24 DIAGNOSIS — I4891 Unspecified atrial fibrillation: Secondary | ICD-10-CM | POA: Diagnosis not present

## 2012-11-24 DIAGNOSIS — I252 Old myocardial infarction: Secondary | ICD-10-CM | POA: Diagnosis not present

## 2012-11-24 DIAGNOSIS — I495 Sick sinus syndrome: Secondary | ICD-10-CM | POA: Diagnosis not present

## 2012-11-24 DIAGNOSIS — K439 Ventral hernia without obstruction or gangrene: Secondary | ICD-10-CM

## 2012-11-24 DIAGNOSIS — I10 Essential (primary) hypertension: Secondary | ICD-10-CM | POA: Diagnosis not present

## 2012-11-24 DIAGNOSIS — Z5189 Encounter for other specified aftercare: Secondary | ICD-10-CM | POA: Diagnosis not present

## 2012-11-24 DIAGNOSIS — I2510 Atherosclerotic heart disease of native coronary artery without angina pectoris: Secondary | ICD-10-CM

## 2012-11-24 DIAGNOSIS — Z7901 Long term (current) use of anticoagulants: Secondary | ICD-10-CM | POA: Diagnosis not present

## 2012-11-24 LAB — CBC WITH DIFFERENTIAL/PLATELET
BASO%: 0.8 % (ref 0.0–2.0)
Basophils Absolute: 0.1 10*3/uL (ref 0.0–0.1)
Eosinophils Absolute: 0.3 10*3/uL (ref 0.0–0.5)
HGB: 12.7 g/dL (ref 11.6–15.9)
LYMPH%: 19.8 % (ref 14.0–49.7)
MONO#: 0.6 10*3/uL (ref 0.1–0.9)
NEUT#: 4 10*3/uL (ref 1.5–6.5)
RBC: 4.25 10*6/uL (ref 3.70–5.45)
RDW: 15.4 % — ABNORMAL HIGH (ref 11.2–14.5)
WBC: 6.1 10*3/uL (ref 3.9–10.3)
lymph#: 1.2 10*3/uL (ref 0.9–3.3)

## 2012-11-24 LAB — COMPREHENSIVE METABOLIC PANEL (CC13)
ALT: 30 U/L (ref 0–55)
Albumin: 3.2 g/dL — ABNORMAL LOW (ref 3.5–5.0)
Alkaline Phosphatase: 118 U/L (ref 40–150)
BUN: 23.5 mg/dL (ref 7.0–26.0)
Calcium: 9 mg/dL (ref 8.4–10.4)
Chloride: 105 mEq/L (ref 98–109)
Creatinine: 1 mg/dL (ref 0.6–1.1)
Glucose: 105 mg/dl (ref 70–140)
Potassium: 3.7 mEq/L (ref 3.5–5.1)
Sodium: 143 mEq/L (ref 136–145)
Total Protein: 6.6 g/dL (ref 6.4–8.3)

## 2012-11-25 NOTE — Progress Notes (Signed)
[facility] OFFICE PROGRESS NOTE  Darnelle Bos, MD [address] [place] [address]  DIAGNOSIS: CAD (coronary artery disease)  Ventral hernia  Atrial fibrillation with RVR  Granulosa cell tumor of ovary, unspecified laterality - Plan: CBC with Differential, Comprehensive metabolic panel  Chief Complaint  Patient presents with  . Granulosa cell tumor, unspecified laterality    CURRENT THERAPY: Close observation for her Granulosa cell tumor as she continue cardiac management for multiple cardiac probems.    INTERVAL HISTORY: Patty [name] 79 y.o. female with a history of granulosa cell tumor of ovary is here for follow-up.  She was last seen by Dr. Darrold Span on May 26, 2012. She continues to not have any symptoms suggesting progression of her disease, but her outstanding history is cardiac in nature.  Given her history of symptomatic tachycardia bradycardia syndrome and persistent atrial fibrillation, she had a pacemaker implantation on 11/12/2012 by Dr. Everette Rank. She had multivessel rotational atherectomy and stent placement in July 2014.  Patient was hospitalized in Nov 2013 with atrial fibrillation with RVR, attempts at cardioversion unsuccessful. She was admitted again 2-5 thru 03-20-12 with symptomatic tachy brady syndrome, with leadless pacer implantation by Dr Johney Frame on 03-16-12 (per patient, hers was the second [place] procedure).   Today , she reports receiving the flu shot.  She denies abdominal pain or fevers/chills, acute shortness of breath. In addition, she denies any weight changes.  She denies as chest pain or worsening dyspnea on exertion.  She reports compliance to her medications for her recent stents place earlier this year. She reports good energy and her appetite is ok.   MEDICAL HISTORY: Past Medical History  Diagnosis Date  . IHD (ischemic heart disease)     Remote MI in 2000 with stent to LAD and LCX. Failed  intervention  to OM in 2009. Managed medically since that time.  . Hypertension   . Hyperlipidemia   . Obesity   . Granulosa cell tumor     Has had several surgeries due to her tumor  . Diverticulitis   . Shortness of breath   . Peripheral vascular disease   . Other and unspecified angina pectoris   . MI (myocardial infarction) 06/1998    "when they were putting in the stent" (08/11/2012)  . Varicose veins   . Granulosa cell carcinoma     abd; "last episode was in 2009" (08/11/2012)  . Dysrhythmia   . Atrial flutter   . Tachycardia-bradycardia syndrome     leadless pacemaker ([name]) implanted by Dr Johney Frame  . Atrial fibrillation     "just developed this winter 2013" (08/11/2012)  . CHF (congestive heart failure)   . OSA on CPAP   . Arthritis     "touch in my fingers & right hip" (11/12/2012)    INTERIM HISTORY: has CAD (coronary artery disease); HTN (hypertension); Hyperlipidemia; Renal artery stenosis; Obesity; Fatigue; Granulosa cell tumor of ovary; Ventral hernia; Atrial fibrillation with RVR; Other and unspecified angina pectoris; Tachycardia-bradycardia syndrome; and Atrial fibrillation on her problem list.    ALLERGIES:  is allergic to bee venom; adhesive; amoxicillin; and other.  MEDICATIONS: has a current medication list which includes the following prescription(s): atorvastatin, calcium carb-cholecalciferol, clopidogrel, coenzyme q-10, diltiazem, diphenhydramine, docusate sodium, furosemide, hydrocortisone cream, isosorbide mononitrate, lisinopril, metoprolol, multivitamin with minerals, nitroglycerin, omega-3 fatty acids, potassium chloride sa, vitamin e, and warfarin.  SURGICAL HISTORY:  Past Surgical History  Procedure Laterality Date  . Cholecystectomy  1980's  .  Salivary gland surgery  2000's    "had a little lump removed; granulosa related; it was benign" (08/11/2012)  . US echocardiography  11/21/2003    EF 55-60%  . Cardiovascular stress test  03/23/2009    EF 72%  . Abdominal  hysterectomy    . Colon surgery  2004    colectomy for diverticulosis  . Granulosa tumor excision  2000; 2003; 2004; 2007    "all in my abdomen including small intestines, outside my ?uterus/etc" (08/11/2012)  . Diagnostic laparoscopy      gallbladder removal; abdominal hernia repair  . Tee without cardioversion  12/31/2011    Procedure: TRANSESOPHAGEAL ECHOCARDIOGRAM (TEE);  Surgeon: Corky Crafts, MD;  Location: [facility] ENDOSCOPY;  Service: Cardiovascular;  Laterality: N/A;  . Cardioversion  12/31/2011    Procedure: CARDIOVERSION;  Surgeon: Corky Crafts, MD;  Location[facility] ENDOSCOPY;  Service: Cardiovascular;  Laterality: N/A;  . Pacemaker insertion  2/11/4    [name] (SJM) leadless pacemaker (LEADLESS II STUDY PATEINT)  . Cardiac catheterization  09/03/2007    EF 70%; Failed attempt at PCI to OM  . Cardiac catheterization  11/01/2003    EF 70%  . Coronary angioplasty    . Coronary angioplasty with stent placement  2000; 08/11/2012; 11/12/2012    "3 + 2 +1; total of 6" (11/12/2012)  . Insert / replace / remove pacemaker  03/16/2012    [name] (SJM) leadless pacemaker (LEADLESS II STUDY PATEINT)  . Varicose vein surgery Bilateral 1977  . Hernia repair  2005    "laparoscopic"   ONCOLOGIC HISTORY: The granulosa cell tumor was initially diagnosed at [facility] in 2000, with recurrences in 2004, 2007 and spring 2011. The original operative note from 2000 and that pathology did not transfer into present EMR and I do not know laterality of the original tumor. The surgery in 2004 included resection of the tumor with small bowel resection and colectomy for diverticulosis. She had laparoscopic repair of ventral hernia in 2005. In 2007 she had resection of pelvic recurrence of tumor plus open repair of ventral hernia with mesh. Most recent intervention was laparoscopic resection of suprapubic involvement by Dr.Hoxworth in May 2011; she had a small and asymptomatic suprapubic hernia in 2011 which  has been followed. All of her disease has been very indolent and has generally been detected by CT scans. Last CT AP was April 2013; [name], self-limited episode of bowel obstruction in May 2012. Last note from Dr Johna Sheriff was fall 2013; she may have missed next visit with him because of the cardiac problems.  REVIEW OF SYSTEMS:   Constitutional: Denies fevers, chills or abnormal weight loss Eyes: Denies blurriness of vision Ears, nose, mouth, throat, and face: Denies mucositis or sore throat Respiratory: Denies cough, dyspnea or wheezes Cardiovascular: Denies palpitation, chest discomfort or lower extremity swelling; Has baseline dyspnea on exertion.  Gastrointestinal:  Denies nausea, heartburn or change in bowel habits Skin: Denies abnormal skin rashes Lymphatics: Denies new lymphadenopathy or easy bruising Neurological:Denies numbness, tingling or new weaknesses Behavioral/Psych: Mood is stable, no new changes  All other systems were reviewed with the patient and are negative.  PHYSICAL EXAMINATION: ECOG PERFORMANCE STATUS: 0 - Asymptomatic  Blood pressure 138/76, pulse 60, temperature 97 F (36.1 C), temperature source Oral, resp. rate 20, height 5' (1.524 m), weight 173 lb 8 oz (78.699 kg).  GENERAL:alert, no distress and comfortable SKIN: skin color,  texture, turgor are normal, no rashes or significant lesions EYES: normal, Conjunctiva are pink and non-injected, sclera clear OROPHARYNX:no exudate, no erythema and lips, buccal mucosa, and tongue normal  NECK: supple, thyroid normal size, non-tender, without nodularity LYMPH:  no palpable lymphadenopathy in the cervical, axillary or supraclavicular LUNGS: clear to auscultation and percussion with normal breathing effort HEART: Irregular Irregular and no murmurs and trace extremity edema ABDOMEN:abdomen soft, non-tender and normal bowel  sounds Musculoskeletal:no cyanosis of digits and no clubbing  NEURO: alert & oriented x 3 with fluent speech, no focal motor/sensory deficits   LABORATORY DATA: Results for orders placed in visit on 11/24/12 (from the past 48 hour(s))  CBC WITH DIFFERENTIAL     Status: Abnormal   Collection Time    11/24/12  9:39 AM      Result Value Range   WBC 6.1  3.9 - 10.3 10e3/uL   NEUT# 4.0  1.5 - 6.5 10e3/uL   HGB 12.7  11.6 - 15.9 g/dL   HCT 96.0  45.4 - 09.8 %   Platelets 180  145 - 400 10e3/uL   MCV 90.7  79.5 - 101.0 fL   MCH 29.9  25.1 - 34.0 pg   MCHC 33.0  31.5 - 36.0 g/dL   RBC 1.19  1.47 - 8.29 10e6/uL   RDW 15.4 (*) 11.2 - 14.5 %   lymph# 1.2  0.9 - 3.3 10e3/uL   MONO# 0.6  0.1 - 0.9 10e3/uL   Eosinophils Absolute 0.3  0.0 - 0.5 10e3/uL   Basophils Absolute 0.1  0.0 - 0.1 10e3/uL   NEUT% 65.0  38.4 - 76.8 %   LYMPH% 19.8  14.0 - 49.7 %   MONO% 9.8  0.0 - 14.0 %   EOS% 4.6  0.0 - 7.0 %   BASO% 0.8  0.0 - 2.0 %  COMPREHENSIVE METABOLIC PANEL (CC13)     Status: Abnormal   Collection Time    11/24/12  9:40 AM      Result Value Range   Sodium 143  136 - 145 mEq/L   Potassium 3.7  3.5 - 5.1 mEq/L   Chloride 105  98 - 109 mEq/L   CO2 29  22 - 29 mEq/L   Glucose 105  70 - 140 mg/dl   BUN 56.2  7.0 - 13.0 mg/dL   Creatinine 1.0  0.6 - 1.1 mg/dL   Total Bilirubin 8.65  0.20 - 1.20 mg/dL   Alkaline Phosphatase 118  40 - 150 U/L   AST 35 (*) 5 - 34 U/L   ALT 30  0 - 55 U/L   Total Protein 6.6  6.4 - 8.3 g/dL   Albumin 3.2 (*) 3.5 - 5.0 g/dL   Calcium 9.0  8.4 - 78.4 mg/dL   Anion Gap 10  3 - 11 mEq/L       Labs:  Lab Results  Component Value Date   WBC 6.1 11/24/2012   HGB 12.7 11/24/2012   HCT 38.6 11/24/2012   MCV 90.7 11/24/2012   PLT 180 11/24/2012   NEUTROABS 4.0 11/24/2012      Chemistry      Component Value Date/Time   NA 143 11/24/2012 0940   NA 143 11/13/2012 0659   K 3.7 11/24/2012 0940   K 3.2* 11/13/2012 0659   CL 106 11/13/2012 0659   CL 105  05/26/2012 1111   CO2 29 11/24/2012 0940   CO2 29 11/13/2012 0659   BUN 23.5 11/24/2012 0940  BUN 18 11/13/2012 0659   CREATININE 1.0 11/24/2012 0940   CREATININE 0.83 11/13/2012 0659      Component Value Date/Time   CALCIUM 9.0 11/24/2012 0940   CALCIUM 8.5 11/13/2012 0659   ALKPHOS 118 11/24/2012 0940   ALKPHOS 99 03/15/2012 0605   AST 35* 11/24/2012 0940   AST 34 03/15/2012 0605   ALT 30 11/24/2012 0940   ALT 73* 03/15/2012 0605   BILITOT 0.87 11/24/2012 0940   BILITOT 0.4 03/15/2012 0605     Basic Metabolic Panel:  Recent Labs Lab 11/24/12 0940  NA 143  K 3.7  CO2 29  GLUCOSE 105  BUN 23.5  CREATININE 1.0  CALCIUM 9.0   GFR Estimated Creatinine Clearance: 41.7 ml/min (by C-G formula based on Cr of 1). Liver Function Tests:  Recent Labs Lab 11/24/12 0940  AST 35*  ALT 30  ALKPHOS 118  BILITOT 0.87  PROT 6.6  ALBUMIN 3.2*   CBC:  Recent Labs Lab 11/24/12 0939  WBC 6.1  NEUTROABS 4.0  HGB 12.7  HCT 38.6  MCV 90.7  PLT 180   Studies:  No results found.   RADIOGRAPHIC STUDIES: No results found.  ASSESSMENT: Patty [name] 79 y.o. female with a history of CAD (coronary artery disease)  Ventral hernia  Atrial fibrillation with RVR  Granulosa cell tumor of ovary, unspecified laterality - Plan: CBC with Differential, Comprehensive metabolic panel   PLAN:  1.granulosa cell tumor of ovary: recurrences in local region since 2004 treated surgically, very indolent. Has not had any systemic treatment. As she has no symptoms now and with all of recent cardiac problems, we continue to hold off on additional scans now  We reviewed her imaging consistent with stable disease. 2.atrial fibrillation and tachy brady syndrome: leadless pacer placed in Feb 2014. Still under close attention of cardiologists; had a repeat procedure two weeks ago.  She reports doing well.  3.coumadin for cardiac indications, monitored by cardiology  4.elevated AST.  related to  medications or procedures or cardiac related.  5. Follow-up.  Patient instructed to follow-up with Dr. Darrold Span in 6 months.  She requested her given her long-standing relationship with Dr. Darrold Span.  She will require CMP and chemistries at this visit.   All questions were answered. The patient knows to call the clinic with any problems, questions or concerns. We can certainly see the patient much sooner if necessary.  I spent 15 minutes counseling the patient face to face. The total time spent in the appointment was 25 minutes.    Gwyn Hieronymus, MD 11/25/2012 4:44 AM

## 2012-11-26 ENCOUNTER — Encounter (HOSPITAL_COMMUNITY)
Admission: RE | Admit: 2012-11-26 | Discharge: 2012-11-26 | Disposition: A | Discharge status: Home / Self Care | Payer: Medicare Other | Source: Ambulatory Visit | Attending: Interventional Cardiology | Admitting: Interventional Cardiology

## 2012-11-26 DIAGNOSIS — Z7901 Long term (current) use of anticoagulants: Secondary | ICD-10-CM | POA: Diagnosis not present

## 2012-11-26 DIAGNOSIS — I4891 Unspecified atrial fibrillation: Secondary | ICD-10-CM | POA: Diagnosis not present

## 2012-11-26 DIAGNOSIS — I10 Essential (primary) hypertension: Secondary | ICD-10-CM | POA: Diagnosis not present

## 2012-11-26 DIAGNOSIS — I2510 Atherosclerotic heart disease of native coronary artery without angina pectoris: Secondary | ICD-10-CM | POA: Diagnosis not present

## 2012-11-26 DIAGNOSIS — Z5189 Encounter for other specified aftercare: Secondary | ICD-10-CM | POA: Diagnosis not present

## 2012-11-26 DIAGNOSIS — I252 Old myocardial infarction: Secondary | ICD-10-CM | POA: Diagnosis not present

## 2012-11-29 ENCOUNTER — Encounter (HOSPITAL_COMMUNITY)
Admission: RE | Admit: 2012-11-29 | Discharge: 2012-11-29 | Disposition: A | Discharge status: Home / Self Care | Payer: Medicare Other | Source: Ambulatory Visit | Attending: Interventional Cardiology | Admitting: Interventional Cardiology

## 2012-11-29 DIAGNOSIS — I252 Old myocardial infarction: Secondary | ICD-10-CM | POA: Diagnosis not present

## 2012-11-29 DIAGNOSIS — I4891 Unspecified atrial fibrillation: Secondary | ICD-10-CM | POA: Diagnosis not present

## 2012-11-29 DIAGNOSIS — Z5189 Encounter for other specified aftercare: Secondary | ICD-10-CM | POA: Diagnosis not present

## 2012-11-29 DIAGNOSIS — I2510 Atherosclerotic heart disease of native coronary artery without angina pectoris: Secondary | ICD-10-CM | POA: Diagnosis not present

## 2012-11-29 DIAGNOSIS — Z7901 Long term (current) use of anticoagulants: Secondary | ICD-10-CM | POA: Diagnosis not present

## 2012-11-29 DIAGNOSIS — I10 Essential (primary) hypertension: Secondary | ICD-10-CM | POA: Diagnosis not present

## 2012-11-29 NOTE — Progress Notes (Signed)
Patty [name] 79 y.o. female Nutrition Note Spoke with pt.  Nutrition Plan and Nutrition Survey goals reviewed with pt. Pt is close to following Step 1 of the Therapeutic Lifestyle Changes diet. Pt wants to lose wt. Pt has been trying to lose wt by decreasing portion sizes. Per pt, "I am a lifetime member of [employer]." Pt states she weighed 184 lbs "last spring." Pt wt is down 12 lb from reported highest wt. Pt wt according to EMR 178.2 lb 05/2012.  Wt loss tips reviewed. Pt is on Coumadin and reports following a diet consistent in Vitamin K. Pt expressed understanding of the information reviewed. Pt aware of nutrition education classes offered.  Nutrition Diagnosis   Food-and nutrition-related knowledge deficit related to lack of exposure to information as related to diagnosis of: ? CVD ?    Obesity related to excessive energy intake as evidenced by a BMI of 32.8    Nutrition RX/ Estimated Daily Nutrition Needs for: wt loss  1200-1300 Kcal, 30-35 gm fat, 8-10 gm sat fat, 1.1-1.3 gm trans-fat, <1500 mg sodium   Nutrition Intervention   Pt's individual nutrition plan including cholesterol goals reviewed with pt.   Benefits of adopting Therapeutic Lifestyle Changes discussed when Medficts reviewed.   Pt to attend the Portion Distortion class   Pt to attend the  ? Nutrition I class                     ? Nutrition II class - met 11/09/12   Continue client-centered nutrition education by RD, as part of interdisciplinary care.  Goal(s)   Pt to identify food quantities necessary to achieve: ? wt loss to a goal wt of 148-160 lb (67.1-72.5 kg) at graduation from cardiac rehab.    Pt to describe the benefit of including fruits, vegetables, whole grains, and low-fat dairy products in a heart healthy meal plan.  Monitor and Evaluate progress toward nutrition goal with team. Nutrition Risk:  Low   Mickle Plumb, M.Ed, RD, LDN, CDE 11/29/2012 2:15 PM

## 2012-11-30 ENCOUNTER — Emergency Department (HOSPITAL_COMMUNITY): Payer: Medicare Other

## 2012-11-30 ENCOUNTER — Encounter (HOSPITAL_COMMUNITY): Payer: Medicare Other

## 2012-11-30 ENCOUNTER — Encounter (HOSPITAL_COMMUNITY): Payer: Self-pay | Admitting: Emergency Medicine

## 2012-11-30 ENCOUNTER — Observation Stay (HOSPITAL_COMMUNITY)
Admission: EM | Admit: 2012-11-30 | Discharge: 2012-12-02 | Disposition: A | Discharge status: Home / Self Care | Payer: Medicare Other | Attending: Emergency Medicine | Admitting: Emergency Medicine

## 2012-11-30 ENCOUNTER — Other Ambulatory Visit: Payer: Self-pay

## 2012-11-30 ENCOUNTER — Telehealth: Payer: Self-pay | Admitting: Cardiology

## 2012-11-30 DIAGNOSIS — Z9889 Other specified postprocedural states: Secondary | ICD-10-CM | POA: Insufficient documentation

## 2012-11-30 DIAGNOSIS — Z9861 Coronary angioplasty status: Secondary | ICD-10-CM | POA: Diagnosis not present

## 2012-11-30 DIAGNOSIS — Z7902 Long term (current) use of antithrombotics/antiplatelets: Secondary | ICD-10-CM | POA: Insufficient documentation

## 2012-11-30 DIAGNOSIS — I495 Sick sinus syndrome: Secondary | ICD-10-CM | POA: Insufficient documentation

## 2012-11-30 DIAGNOSIS — R0789 Other chest pain: Secondary | ICD-10-CM | POA: Diagnosis not present

## 2012-11-30 DIAGNOSIS — I2510 Atherosclerotic heart disease of native coronary artery without angina pectoris: Secondary | ICD-10-CM

## 2012-11-30 DIAGNOSIS — I5040 Unspecified combined systolic (congestive) and diastolic (congestive) heart failure: Secondary | ICD-10-CM

## 2012-11-30 DIAGNOSIS — I200 Unstable angina: Principal | ICD-10-CM

## 2012-11-30 DIAGNOSIS — K5732 Diverticulitis of large intestine without perforation or abscess without bleeding: Secondary | ICD-10-CM | POA: Insufficient documentation

## 2012-11-30 DIAGNOSIS — E669 Obesity, unspecified: Secondary | ICD-10-CM | POA: Insufficient documentation

## 2012-11-30 DIAGNOSIS — Z91038 Other insect allergy status: Secondary | ICD-10-CM | POA: Diagnosis not present

## 2012-11-30 DIAGNOSIS — I10 Essential (primary) hypertension: Secondary | ICD-10-CM | POA: Insufficient documentation

## 2012-11-30 DIAGNOSIS — I252 Old myocardial infarction: Secondary | ICD-10-CM | POA: Insufficient documentation

## 2012-11-30 DIAGNOSIS — R079 Chest pain, unspecified: Secondary | ICD-10-CM | POA: Diagnosis not present

## 2012-11-30 DIAGNOSIS — M129 Arthropathy, unspecified: Secondary | ICD-10-CM | POA: Insufficient documentation

## 2012-11-30 DIAGNOSIS — Z880 Allergy status to penicillin: Secondary | ICD-10-CM | POA: Insufficient documentation

## 2012-11-30 DIAGNOSIS — R0602 Shortness of breath: Secondary | ICD-10-CM | POA: Diagnosis not present

## 2012-11-30 DIAGNOSIS — I739 Peripheral vascular disease, unspecified: Secondary | ICD-10-CM | POA: Insufficient documentation

## 2012-11-30 DIAGNOSIS — E785 Hyperlipidemia, unspecified: Secondary | ICD-10-CM | POA: Diagnosis not present

## 2012-11-30 DIAGNOSIS — Z7901 Long term (current) use of anticoagulants: Secondary | ICD-10-CM | POA: Insufficient documentation

## 2012-11-30 DIAGNOSIS — G4733 Obstructive sleep apnea (adult) (pediatric): Secondary | ICD-10-CM | POA: Insufficient documentation

## 2012-11-30 DIAGNOSIS — Z8543 Personal history of malignant neoplasm of ovary: Secondary | ICD-10-CM | POA: Insufficient documentation

## 2012-11-30 DIAGNOSIS — Z8719 Personal history of other diseases of the digestive system: Secondary | ICD-10-CM | POA: Diagnosis not present

## 2012-11-30 DIAGNOSIS — Z9981 Dependence on supplemental oxygen: Secondary | ICD-10-CM | POA: Insufficient documentation

## 2012-11-30 DIAGNOSIS — I4891 Unspecified atrial fibrillation: Secondary | ICD-10-CM | POA: Diagnosis not present

## 2012-11-30 DIAGNOSIS — Z9109 Other allergy status, other than to drugs and biological substances: Secondary | ICD-10-CM | POA: Diagnosis not present

## 2012-11-30 DIAGNOSIS — Z87891 Personal history of nicotine dependence: Secondary | ICD-10-CM | POA: Insufficient documentation

## 2012-11-30 DIAGNOSIS — Z79899 Other long term (current) drug therapy: Secondary | ICD-10-CM | POA: Insufficient documentation

## 2012-11-30 DIAGNOSIS — J9819 Other pulmonary collapse: Secondary | ICD-10-CM | POA: Diagnosis not present

## 2012-11-30 LAB — BASIC METABOLIC PANEL
BUN: 19 mg/dL (ref 6–23)
Chloride: 103 mEq/L (ref 96–112)
Creatinine, Ser: 0.82 mg/dL (ref 0.50–1.10)
GFR calc Af Amer: 76 mL/min — ABNORMAL LOW (ref >90)
GFR calc non Af Amer: 66 mL/min — ABNORMAL LOW (ref >90)
Glucose, Bld: 120 mg/dL — ABNORMAL HIGH (ref 70–99)

## 2012-11-30 LAB — CBC
HCT: 36.2 % (ref 36.0–46.0)
Hemoglobin: 12.2 g/dL (ref 12.0–15.0)
MCH: 30.7 pg (ref 26.0–34.0)
MCV: 91 fL (ref 78.0–100.0)
Platelets: 169 10*3/uL (ref 150–400)
RBC: 3.98 MIL/uL (ref 3.87–5.11)
RDW: 14.8 % (ref 11.5–15.5)
WBC: 5.7 10*3/uL (ref 4.0–10.5)

## 2012-11-30 LAB — PRO B NATRIURETIC PEPTIDE: Pro B Natriuretic peptide (BNP): 1713 pg/mL — ABNORMAL HIGH (ref 0–450)

## 2012-11-30 NOTE — ED Provider Notes (Signed)
CSN: [id number]     Arrival date & time 11/30/12  2245 History   First MD Initiated Contact with Patient 11/30/12 2304     Chief Complaint  Patient presents with  . Chest Pain    HPI Patient developed chest discomfort this evening with associated "clamminess" as well as mild shortness of breath.  She had some radiation of her discomfort towards her left shoulder.  At times the pain was sharp followed by a pressure sensation.  The patient has 6 cardiac stents with her most recent stent 3 weeks ago where she received a drug-eluting stent to her proximal circumflex.  Patient states this feels worse than her typical angina.  She was in cardiac rehabilitation yesterday and developed some chest discomfort it was transient after exercise.  She reports most today she fell rather poor.  She has a known history of atrial fibrillation.  She is on Coumadin.  She's compliant with her medications.  She continues to take her Plavix.  She denies palpitations.  No fevers or chills.  No active pain at this time.  She received nitroglycerin by EMS and took 2 nitroglycerin at home with resolution of her symptoms.   Past Medical History  Diagnosis Date  . IHD (ischemic heart disease)     Remote MI in 2000 with stent to LAD and LCX. Failed  intervention to OM in 2009. Managed medically since that time.  . Hypertension   . Hyperlipidemia   . Obesity   . Granulosa cell tumor     Has had several surgeries due to her tumor  . Diverticulitis   . Shortness of breath   . Peripheral vascular disease   . Other and unspecified angina pectoris   . MI (myocardial infarction) 06/1998    "when they were putting in the stent" (08/11/2012)  . Varicose veins   . Granulosa cell carcinoma     abd; "last episode was in 2009" (08/11/2012)  . Dysrhythmia   . Atrial flutter   . Tachycardia-bradycardia syndrome     leadless pacemaker ([name]) implanted by Dr Johney Frame  . Atrial fibrillation     "just developed this winter 2013"  (08/11/2012)  . CHF (congestive heart failure)   . OSA on CPAP   . Arthritis     "touch in my fingers & right hip" (11/12/2012)   Past Surgical History  Procedure Laterality Date  . Cholecystectomy  1980's  . Salivary gland surgery  2000's    "had a little lump removed; granulosa related; it was benign" (08/11/2012)  . US echocardiography  11/21/2003    EF 55-60%  . Cardiovascular stress test  03/23/2009    EF 72%  . Abdominal hysterectomy    . Colon surgery  2004    colectomy for diverticulosis  . Granulosa tumor excision  2000; 2003; 2004; 2007    "all in my abdomen including small intestines, outside my ?uterus/etc" (08/11/2012)  . Diagnostic laparoscopy      gallbladder removal; abdominal hernia repair  . Tee without cardioversion  12/31/2011    Procedure: TRANSESOPHAGEAL ECHOCARDIOGRAM (TEE);  Surgeon: Corky Crafts, MD;  Location: [facility] ENDOSCOPY;  Service: Cardiovascular;  Laterality: N/A;  . Cardioversion  12/31/2011    Procedure: CARDIOVERSION;  Surgeon: Corky Crafts, MD;  Location: [facility] END[facility];  Service: Cardiovascular;  Laterality: N/A;  . Pacemaker insertion  2/11/4    [name] (SJM) leadless pacemaker (LEADLESS II STUDY PATEINT)  . Cardiac catheterization  09/03/2007    EF 70%; Failed  attempt at PCI to OM  . Cardiac catheterization  11/01/2003    EF 70%  . Coronary angioplasty    . Coronary angioplasty with stent placement  2000; 08/11/2012; 11/12/2012    "3 + 2 +1; total of 6" (11/12/2012)  . Insert / replace / remove pacemaker  03/16/2012    [name] (SJM) leadless pacemaker (LEADLESS II STUDY PATEINT)  . Varicose vein surgery Bilateral 1977  . Hernia repair  2005    "laparoscopic"   Family History  Problem Relation Age of Onset  . Heart attack Father    History  Substance Use Topics  . Smoking status: Former Smoker -- 1.00 packs/day for 32 years    Types: Cigarettes    Quit date: 02/03/1974  . Smokeless tobacco: Never Used  . Alcohol Use: 3.0  oz/week    5 Glasses of wine per week     Comment: 11/12/2012 "4oz wine w/dinner 5 days/wk"   OB History   Grav Para Term Preterm Abortions TAB SAB Ect Mult Living                 Review of Systems  All other systems reviewed and are negative.    Allergies  Bee venom; Adhesive; Amoxicillin; and Other  Home Medications   Current Outpatient Rx  Name  Route  Sig  Dispense  Refill  . atorvastatin (LIPITOR) 80 MG tablet   Oral   Take 1 tablet (80 mg total) by mouth every morning.   90 tablet   2   . Calcium Carb-Cholecalciferol (CALTRATE 600+D) 600-800 MG-UNIT TABS   Oral   Take by mouth every morning.         . clopidogrel (PLAVIX) 75 MG tablet   Oral   Take 1 tablet (75 mg total) by mouth daily with breakfast.   30 tablet   11   . Coenzyme Q-10 100 MG capsule   Oral   Take 100 mg by mouth daily.          Marland Kitchen dextromethorphan-guaiFENesin (ROBITUSSIN-DM) 10-100 MG/5ML liquid   Oral   Take 5 mLs by mouth every 4 (four) hours as needed for cough.         . diltiazem (CARDIZEM CD) 120 MG 24 hr capsule   Oral   Take 1 capsule (120 mg total) by mouth daily.         . diphenhydrAMINE (BENADRYL) 25 MG tablet   Oral   Take 25 mg by mouth every 6 (six) hours as needed for itching (and rash).         Marland Kitchen docusate sodium (COLACE) 100 MG capsule   Oral   Take 100 mg by mouth daily as needed for constipation.         . furosemide (LASIX) 40 MG tablet   Oral   Take 0.5-1 tablets (20-40 mg total) by mouth 2 (two) times daily. Take 1 tablet in the morning and 1/2 tablet in the afternoon   180 tablet   2   . hydrocortisone cream 1 %   Topical   Apply 1 application topically daily as needed (for rash).         . isosorbide mononitrate (IMDUR) 60 MG 24 hr tablet   Oral   Take 0.5-1 tablets (30-60 mg total) by mouth 2 (two) times daily. Take 1 tablet in the morning and 1/2 tablet in the afternoon   135 tablet   1   . lisinopril (PRINIVIL,ZESTRIL) 10 MG  tablet  Oral   Take 0.5 tablets (5 mg total) by mouth 2 (two) times daily.   90 tablet   2   . metoprolol (LOPRESSOR) 100 MG tablet   Oral   Take 1 tablet (100 mg total) by mouth 2 (two) times daily.   90 tablet   2   . Multiple Vitamin (MULTIVITAMIN WITH MINERALS) TABS tablet   Oral   Take 1 tablet by mouth every morning.         . nitroGLYCERIN (NITROSTAT) 0.4 MG SL tablet   Sublingual   Place 1 tablet (0.4 mg total) under the tongue every 5 (five) minutes as needed. For chest pain   35 tablet   11   . Omega-3 Fatty Acids (FISH OIL PO)   Oral   Take 1,200 mg by mouth daily.          . potassium chloride SA (K-DUR,KLOR-CON) 20 MEQ tablet   Oral   Take 40 mEq by mouth every morning.         . vitamin E 400 UNIT capsule   Oral   Take 400 Units by mouth daily as needed (for vitamin).          Marland Kitchen warfarin (COUMADIN) 2 MG tablet   Oral   Take 0.5-1 tablets (1-2 mg total) by mouth See admin instructions. Take  1 tablet daily except take 1/2 tablet on sunday and tuesday   90 tablet   1    BP 128/84  Temp(Src) 98.7 F (37.1 C) (Oral)  Resp 17  SpO2 100% Physical Exam  Nursing note and vitals reviewed. Constitutional: She is oriented to person, place, and time. She appears well-developed and well-nourished. No distress.  HENT:  Head: Normocephalic and atraumatic.  Eyes: EOM are normal.  Neck: Normal range of motion.  Cardiovascular: Normal rate, regular rhythm and normal heart sounds.   Pulmonary/Chest: Effort normal and breath sounds normal.  Abdominal: Soft. She exhibits no distension. There is no tenderness.  Musculoskeletal: Normal range of motion.  Neurological: She is alert and oriented to person, place, and time.  Skin: Skin is warm and dry.  Psychiatric: She has a normal mood and affect. Judgment normal.    ED Course  Procedures (including critical care time)  CRITICAL CARE Performed by: Lyanne Co Total critical care time: 32 Critical  care time was exclusive of separately billable procedures and treating other patients. Critical care was necessary to treat or prevent imminent or life-threatening deterioration. Critical care was time spent personally by me on the following activities: development of treatment plan with patient and/or surrogate as well as nursing, discussions with consultants, evaluation of patient's response to treatment, examination of patient, obtaining history from patient or surrogate, ordering and performing treatments and interventions, ordering and review of laboratory studies, ordering and review of radiographic studies, pulse oximetry and re-evaluation of patient's condition.   Labs Review Labs Reviewed  PRO B NATRIURETIC PEPTIDE - Abnormal; Notable for the following:    Pro B Natriuretic peptide (BNP) 1713.0 (*)    All other components within normal limits  BASIC METABOLIC PANEL - Abnormal; Notable for the following:    Potassium 3.3 (*)    Glucose, Bld 120 (*)    GFR calc non Af Amer 66 (*)    GFR calc Af Amer 76 (*)    All other components within normal limits  CBC  PROTIME-INR  POCT I-STAT TROPONIN I   Imaging Review No results found.  ECG interpretation  Date:  11/30/2012  Rate: 86  Rhythm: atrial fibrillation with controlled rate  QRS Axis: normal  Intervals: normal  ST/T Wave abnormalities: nonspecific ST changes  Conduction Disutrbances: LBBB  Narrative Interpretation:   Old EKG Reviewed: no significant change    MDM   1. Unstable angina    Patient presents with symptoms concerning for unstable angina.  Chest pain free at this time.  EKG with a bundle branch block and atrial fibrillation without significant changes.  She's been compliant with her medications.  She has a fresh stent that is 89 weeks old.  She will need admission for cardiac rule out.  Cardiology consultation.    Lyanne Co, MD 11/30/12 [phone]

## 2012-11-30 NOTE — ED Notes (Signed)
Pt started having CP at 2145, Hx of 6 cardiac stents ([place]), Patient has internal lower heart pace maker, Pt took 2 nitros at home, Pt had 3 nitros from EMS,

## 2012-12-01 ENCOUNTER — Encounter (HOSPITAL_COMMUNITY): Payer: Self-pay | Admitting: Interventional Cardiology

## 2012-12-01 ENCOUNTER — Encounter (HOSPITAL_COMMUNITY): Payer: Medicare Other

## 2012-12-01 DIAGNOSIS — I5040 Unspecified combined systolic (congestive) and diastolic (congestive) heart failure: Secondary | ICD-10-CM | POA: Diagnosis not present

## 2012-12-01 DIAGNOSIS — I4891 Unspecified atrial fibrillation: Secondary | ICD-10-CM | POA: Diagnosis not present

## 2012-12-01 DIAGNOSIS — R0602 Shortness of breath: Secondary | ICD-10-CM | POA: Diagnosis not present

## 2012-12-01 DIAGNOSIS — I5023 Acute on chronic systolic (congestive) heart failure: Secondary | ICD-10-CM | POA: Diagnosis not present

## 2012-12-01 DIAGNOSIS — R079 Chest pain, unspecified: Secondary | ICD-10-CM

## 2012-12-01 DIAGNOSIS — I200 Unstable angina: Secondary | ICD-10-CM

## 2012-12-01 DIAGNOSIS — I10 Essential (primary) hypertension: Secondary | ICD-10-CM | POA: Diagnosis not present

## 2012-12-01 LAB — TROPONIN I: Troponin I: <0.3 ng/mL (ref <0.30)

## 2012-12-01 LAB — GLUCOSE, CAPILLARY: Glucose-Capillary: 106 mg/dL — ABNORMAL HIGH (ref 70–99)

## 2012-12-01 MED ORDER — DILTIAZEM HCL ER COATED BEADS 240 MG PO CP24
240.0000 mg | ORAL_CAPSULE | Freq: Every day | ORAL | Status: DC
  Administered 2012-12-01 – 2012-12-02 (×2): 240 mg via ORAL
  Filled 2012-12-01 (×2): qty 1

## 2012-12-01 MED ORDER — FUROSEMIDE 40 MG PO TABS
40.0000 mg | ORAL_TABLET | Freq: Two times a day (BID) | ORAL | Status: DC
  Administered 2012-12-01: 40 mg via ORAL
  Filled 2012-12-01 (×3): qty 1

## 2012-12-01 MED ORDER — ACETAMINOPHEN 325 MG PO TABS: 650.0000 mg | ORAL_TABLET | ORAL | Status: DC | PRN

## 2012-12-01 MED ORDER — SODIUM CHLORIDE 0.9 % IV SOLN: 250.0000 mL | INTRAVENOUS | Status: DC | PRN

## 2012-12-01 MED ORDER — ISOSORBIDE MONONITRATE ER 60 MG PO TB24: 60.0000 mg | ORAL_TABLET | Freq: Two times a day (BID) | ORAL | Status: DC

## 2012-12-01 MED ORDER — ATORVASTATIN CALCIUM 80 MG PO TABS
80.0000 mg | ORAL_TABLET | Freq: Every morning | ORAL | Status: DC
  Administered 2012-12-01 – 2012-12-02 (×2): 80 mg via ORAL
  Filled 2012-12-01 (×2): qty 1

## 2012-12-01 MED ORDER — SODIUM CHLORIDE 0.9 % IJ SOLN: 3.0000 mL | INTRAMUSCULAR | Status: DC | PRN

## 2012-12-01 MED ORDER — ISOSORBIDE MONONITRATE ER 60 MG PO TB24
60.0000 mg | ORAL_TABLET | Freq: Every day | ORAL | Status: DC
  Administered 2012-12-01 – 2012-12-02 (×2): 60 mg via ORAL
  Filled 2012-12-01 (×2): qty 1

## 2012-12-01 MED ORDER — WARFARIN - PHYSICIAN DOSING INPATIENT: Freq: Every day | Status: DC

## 2012-12-01 MED ORDER — POTASSIUM CHLORIDE CRYS ER 20 MEQ PO TBCR
40.0000 meq | EXTENDED_RELEASE_TABLET | Freq: Every morning | ORAL | Status: DC
  Administered 2012-12-01: 40 meq via ORAL
  Filled 2012-12-01 (×2): qty 2

## 2012-12-01 MED ORDER — METOPROLOL TARTRATE 100 MG PO TABS
100.0000 mg | ORAL_TABLET | Freq: Two times a day (BID) | ORAL | Status: DC
  Administered 2012-12-01 – 2012-12-02 (×3): 100 mg via ORAL
  Filled 2012-12-01 (×4): qty 1

## 2012-12-01 MED ORDER — WARFARIN SODIUM 2 MG PO TABS
2.0000 mg | ORAL_TABLET | ORAL | Status: DC
  Administered 2012-12-01: 2 mg via ORAL
  Filled 2012-12-01 (×2): qty 1

## 2012-12-01 MED ORDER — FUROSEMIDE 10 MG/ML IJ SOLN
60.0000 mg | Freq: Two times a day (BID) | INTRAMUSCULAR | Status: DC
  Administered 2012-12-01 – 2012-12-02 (×3): 60 mg via INTRAVENOUS
  Filled 2012-12-01 (×3): qty 6

## 2012-12-01 MED ORDER — SODIUM CHLORIDE 0.9 % IJ SOLN
3.0000 mL | Freq: Two times a day (BID) | INTRAMUSCULAR | Status: DC
  Administered 2012-12-01 – 2012-12-02 (×3): 3 mL via INTRAVENOUS

## 2012-12-01 MED ORDER — ISOSORBIDE MONONITRATE ER 30 MG PO TB24
30.0000 mg | ORAL_TABLET | Freq: Every day | ORAL | Status: DC
  Administered 2012-12-01: 30 mg via ORAL
  Filled 2012-12-01 (×2): qty 1

## 2012-12-01 MED ORDER — DILTIAZEM HCL ER COATED BEADS 120 MG PO CP24
120.0000 mg | ORAL_CAPSULE | Freq: Every day | ORAL | Status: DC
  Filled 2012-12-01: qty 1

## 2012-12-01 MED ORDER — POTASSIUM CHLORIDE CRYS ER 20 MEQ PO TBCR
30.0000 meq | EXTENDED_RELEASE_TABLET | Freq: Once | ORAL | Status: AC
  Administered 2012-12-01: 30 meq via ORAL

## 2012-12-01 MED ORDER — WARFARIN SODIUM 2 MG PO TABS: 2.0000 mg | ORAL_TABLET | ORAL | Status: DC

## 2012-12-01 MED ORDER — CLOPIDOGREL BISULFATE 75 MG PO TABS
75.0000 mg | ORAL_TABLET | Freq: Every day | ORAL | Status: DC
  Administered 2012-12-01 – 2012-12-02 (×2): 75 mg via ORAL
  Filled 2012-12-01 (×2): qty 1

## 2012-12-01 MED ORDER — DOCUSATE SODIUM 100 MG PO CAPS: 100.0000 mg | ORAL_CAPSULE | Freq: Every day | ORAL | Status: DC | PRN

## 2012-12-01 MED ORDER — WARFARIN SODIUM 1 MG PO TABS: 1.0000 mg | ORAL_TABLET | ORAL | Status: DC

## 2012-12-01 MED ORDER — FUROSEMIDE 10 MG/ML IJ SOLN
INTRAMUSCULAR | Status: AC
  Administered 2012-12-01: 60 mg via INTRAVENOUS
  Filled 2012-12-01: qty 4

## 2012-12-01 MED ORDER — LISINOPRIL 5 MG PO TABS
5.0000 mg | ORAL_TABLET | Freq: Two times a day (BID) | ORAL | Status: DC
  Administered 2012-12-01 – 2012-12-02 (×3): 5 mg via ORAL
  Filled 2012-12-01 (×4): qty 1

## 2012-12-01 MED ORDER — ONDANSETRON HCL 4 MG/2ML IJ SOLN: 4.0000 mg | Freq: Four times a day (QID) | INTRAMUSCULAR | Status: DC | PRN

## 2012-12-01 MED ORDER — ADULT MULTIVITAMIN W/MINERALS CH
1.0000 | ORAL_TABLET | Freq: Every morning | ORAL | Status: DC
  Administered 2012-12-01 – 2012-12-02 (×2): 1 via ORAL
  Filled 2012-12-01 (×2): qty 1

## 2012-12-01 MED ORDER — NITROGLYCERIN 0.4 MG SL SUBL: 0.4000 mg | SUBLINGUAL_TABLET | SUBLINGUAL | Status: DC | PRN

## 2012-12-01 NOTE — Telephone Encounter (Signed)
Called pt regarding chest pain. Spoke to husband.  Paramedics arriving at the time of conversation. Saw pt in ER. See note.

## 2012-12-01 NOTE — Progress Notes (Signed)
SUBJECTIVE:  Chest pain when she overdoes it at rehab.  SHe had some CP this AM with walking to the bathroom.  Her HR was up to 130 bpm.  She had been feeling quite well until she developed a cold a few days ago.  Since then, her breathing ahs been worse, with associated wheezing and she has had some intermittent CP.    OBJECTIVE:   Vitals:   Filed Vitals:   12/01/12 0130 12/01/12 0145 12/01/12 0239 12/01/12 0346  BP: 141/103 152/92 147/86 161/96  Pulse: 102 108 81 92  Temp:    97.6 F (36.4 C)  TempSrc:    Oral  Resp:   20 20  Height:    5' (1.524 m)  Weight:    170 lb 14.4 oz (77.52 kg)  SpO2: 92% 91% 90% 93%   I&O's:   Intake/Output Summary (Last 24 hours) at 12/01/12 0848 Last data filed at 12/01/12 0802  Gross per 24 hour  Intake      3 ml  Output    300 ml  Net   -297 ml   TELEMETRY: Reviewed telemetry pt in AFib:     PHYSICAL EXAM General: Well developed, well nourished, in no acute distress Head:   Normal cephalic and atramatic  Lungs: Wheezing bilaterally to auscultation . Heart:   S1 S2 Irregularly irregular, No JVD.   Abdomen: abdomen soft and non-tender  Msk:  Normal strength and tone for age. Extremities:  No edema.   Neuro: Alert and oriented X 3. Psych:  Good affect, responds appropriately   LABS: Basic Metabolic Panel:  Recent Labs  62/13/08 2301  NA 140  K 3.3*  CL 103  CO2 28  GLUCOSE 120*  BUN 19  CREATININE 0.82  CALCIUM 8.7   Liver Function Tests: No results found for this basename: AST, ALT, ALKPHOS, BILITOT, PROT, ALBUMIN,  in the last 72 hours No results found for this basename: LIPASE, AMYLASE,  in the last 72 hours CBC:  Recent Labs  11/30/12 2301  WBC 5.7  HGB 12.2  HCT 36.2  MCV 91.0  PLT 169   Cardiac Enzymes:  Recent Labs  12/01/12 0608  TROPONINI <0.30   BNP: No components found with this basename: POCBNP,  D-Dimer: No results found for this basename: DDIMER,  in the last 72 hours Hemoglobin A1C: No  results found for this basename: HGBA1C,  in the last 72 hours Fasting Lipid Panel: No results found for this basename: CHOL, HDL, LDLCALC, TRIG, CHOLHDL, LDLDIRECT,  in the last 72 hours Thyroid Function Tests: No results found for this basename: TSH, T4TOTAL, FREET3, T3FREE, THYROIDAB,  in the last 72 hours Anemia Panel: No results found for this basename: VITAMINB12, FOLATE, FERRITIN, TIBC, IRON, RETICCTPCT,  in the last 72 hours Coag Panel:   Lab Results  Component Value Date   INR 1.92* 12/01/2012   INR 1.94* 11/30/2012   INR 1.21 11/13/2012    RADIOLOGY: Dg Chest 2 View  12/01/2012   CLINICAL DATA:  Chest pain  EXAM: CHEST  2 VIEW  COMPARISON:  03/17/2012  FINDINGS: Chronic cardiopericardial enlargement. An implantable device is in stable position. There are Kerley B-lines newly noted. There are lower lung opacities which appear linear in the lateral projection. Coronary artery stents present.  IMPRESSION: 1. Mild pulmonary edema. 2. Lower lung atelectasis.   Electronically Signed   By: Tiburcio Pea M.D.   On: 12/01/2012 00:05      ASSESSMENT: CAD, CP, AFib  PLAN:  [facility] out so far.  Would not plan for repeat cath at this time.  Plan for diuresis with IV Lasix and rate control with Diltiazem.    Continue to check cardiac enzymes.  Corky Crafts., MD  12/01/2012  8:48 AM

## 2012-12-01 NOTE — H&P (Signed)
History and Physical  Patient ID: ISHIA TENORIO MRN: [id number], SOB: 1930/02/23 79 y.o. Date of Encounter: 12/01/2012, 1:43 AM  Primary Physician: Darnelle Bos, MD Primary Cardiologist: Dr. Eldridge Dace  Chief Complaint: chest pain  HPI: 79 y.o. female w/ PMHx significant for CAD s/p multiple stents, HTN, afib, s/p pacer who presented to [facility] on 12/01/2012 with complaints of chest pain.  Her recent history is pertinent for undergoing a cardiac cath on 10/10 for angina symptoms resulting in a stent in the lcx distal to her prior stent. LAD stents were patent, EF of 40%, LVEDP of 21 mmHg. Since then she has participated in cardiac rehab. She reports that yesterday in cardiac rehab, she felt that she exerted herself more than usual as she gets distracted in conversation and finds herself pushing too hard. She did get some mild angina pain during rehab. She felt poorly in the evening, thinking that a head cold was coming on. She continued to feel poorly until she had sudden onset of left sided chest pain that she described initially as sharp and then dissolved into more of a diffuse pressure. Assoc with feeling clammy and mildly SOB of breath. Took 2 nitro at home and then received several more in transport resulting in resolution of her chest pain.  She has not missed any doses of medications (specifically plavix). She takes the evening dose of lasix prn and she did not take any tonight as she did not think that he edema was significant.  Denies any PND or orthopnea symptoms. Denies fever chills. Currently chest pain free.  EKG revealed afib with LBBB. CXR suggested potential fluid overload Labs are significant for BNP. Negative troponin.   Past Medical History  Diagnosis Date  . IHD (ischemic heart disease)     Remote MI in 2000 with stent to LAD and LCX. Failed  intervention to OM in 2009. Managed medically since that time.  . Hypertension   . Hyperlipidemia   . Obesity    . Granulosa cell tumor     Has had several surgeries due to her tumor  . Diverticulitis   . Shortness of breath   . Peripheral vascular disease   . Other and unspecified angina pectoris   . MI (myocardial infarction) 06/1998    "when they were putting in the stent" (08/11/2012)  . Varicose veins   . Granulosa cell carcinoma     abd; "last episode was in 2009" (08/11/2012)  . Dysrhythmia   . Atrial flutter   . Tachycardia-bradycardia syndrome     leadless pacemaker ([name]) implanted by Dr Johney Frame  . Atrial fibrillation     "just developed this winter 2013" (08/11/2012)  . CHF (congestive heart failure)   . OSA on CPAP   . Arthritis     "touch in my fingers & right hip" (11/12/2012)     Surgical History:  Past Surgical History  Procedure Laterality Date  . Cholecystectomy  1980's  . Salivary gland surgery  2000's    "had a little lump removed; granulosa related; it was benign" (08/11/2012)  . US echocardiography  11/21/2003    EF 55-60%  . Cardiovascular stress test  03/23/2009    EF 72%  . Abdominal hysterectomy    . Colon surgery  2004    colectomy for diverticulosis  . Granulosa tumor excision  2000; 2003; 2004; 2007    "all in my abdomen including small intestines, outside my ?uterus/etc" (08/11/2012)  . Diagnostic laparoscopy  gallbladder removal; abdominal hernia repair  . Tee without cardioversion  12/31/2011    Procedure: TRANSESOPHAGEAL ECHOCARDIOGRAM (TEE);  Surgeon: Corky Crafts, MD;  Location: [facility] ENDOSCOPY;  Service: Cardiovascular;  Laterality: N/A;  . Cardioversion  12/31/2011    Procedure: CARDIOVERSION;  Surgeon: Corky Crafts, MD;  Location: [facility] ENDOSCOPY;  Service: Cardiovascular;  Laterality: N/A;  . Pacemaker insertion  2/11/4    [name] (SJM) leadless pacemaker (LEADLESS II STUDY PATEINT)  . Cardiac catheterization  09/03/2007    EF 70%; Failed attempt at PCI to OM  . Cardiac catheterization  11/01/2003    EF 70%  . Coronary angioplasty     . Coronary angioplasty with stent placement  2000; 08/11/2012; 11/12/2012    "3 + 2 +1; total of 6" (11/12/2012)  . Insert / replace / remove pacemaker  03/16/2012    [name] (SJM) leadless pacemaker (LEADLESS II STUDY PATEINT)  . Varicose vein surgery Bilateral 1977  . Hernia repair  2005    "laparoscopic"     Home Meds: Prior to Admission medications   Medication Sig Start Date End Date Taking? Authorizing Provider  atorvastatin (LIPITOR) 80 MG tablet Take 1 tablet (80 mg total) by mouth every morning. 11/19/12  Yes Everette Rank, MD  Calcium Carb-Cholecalciferol (CALTRATE 600+D) 600-800 MG-UNIT TABS Take by mouth every morning.   Yes Historical Provider, MD  clopidogrel (PLAVIX) 75 MG tablet Take 1 tablet (75 mg total) by mouth daily with breakfast. 08/06/12  Yes Everette Rank, MD  Coenzyme Q-10 100 MG capsule Take 100 mg by mouth daily.    Yes Historical Provider, MD  dextromethorphan-guaiFENesin (ROBITUSSIN-DM) 10-100 MG/5ML liquid Take 5 mLs by mouth every 4 (four) hours as needed for cough.   Yes Historical Provider, MD  diltiazem (CARDIZEM CD) 120 MG 24 hr capsule Take 1 capsule (120 mg total) by mouth daily. 11/13/12  Yes Everette Rank, MD  diphenhydrAMINE (BENADRYL) 25 MG tablet Take 25 mg by mouth every 6 (six) hours as needed for itching (and rash).   Yes Historical Provider, MD  docusate sodium (COLACE) 100 MG capsule Take 100 mg by mouth daily as needed for constipation.   Yes Historical Provider, MD  furosemide (LASIX) 40 MG tablet Take 0.5-1 tablets (20-40 mg total) by mouth 2 (two) times daily. Take 1 tablet in the morning and 1/2 tablet in the afternoon 11/19/12  Yes Everette Rank, MD  hydrocortisone cream 1 % Apply 1 application topically daily as needed (for rash).   Yes Historical Provider, MD  isosorbide mononitrate (IMDUR) 60 MG 24 hr tablet Take 0.5-1 tablets (30-60 mg total) by mouth 2 (two) times daily. Take 1 tablet in the morning and 1/2 tablet in the afternoon 11/19/12   Yes Everette Rank, MD  lisinopril (PRINIVIL,ZESTRIL) 10 MG tablet Take 0.5 tablets (5 mg total) by mouth 2 (two) times daily. 11/19/12  Yes Everette Rank, MD  metoprolol (LOPRESSOR) 100 MG tablet Take 1 tablet (100 mg total) by mouth 2 (two) times daily. 11/19/12 11/19/13 Yes Everette Rank, MD  Multiple Vitamin (MULTIVITAMIN WITH MINERALS) TABS tablet Take 1 tablet by mouth every morning.   Yes Historical Provider, MD  nitroGLYCERIN (NITROSTAT) 0.4 MG SL tablet Place 1 tablet (0.4 mg total) under the tongue every 5 (five) minutes as needed. For chest pain 03/23/12  Yes Marinus Maw, MD  Omega-3 Fatty Acids (FISH OIL PO) Take 1,200 mg by mouth daily.    Yes Historical Provider, MD  potassium chloride SA (K-DUR,KLOR-CON) 20 MEQ  tablet Take 40 mEq by mouth every morning.   Yes Historical Provider, MD  vitamin E 400 UNIT capsule Take 400 Units by mouth daily as needed (for vitamin).    Yes Historical Provider, MD  warfarin (COUMADIN) 2 MG tablet Take 0.5-1 tablets (1-2 mg total) by mouth See admin instructions. Take  1 tablet daily except take 1/2 tablet on sunday and tuesday 11/19/12  Yes Everette Rank, MD    Allergies:  Allergies  Allergen Reactions  . Bee Venom Anaphylaxis  . Adhesive [Tape]   . Amoxicillin Diarrhea and Nausea And Vomiting  . Other     Pain medications cause severe vomiting.    History   Social History  . Marital Status: Married    Spouse Name: N/A    Number of Children: N/A  . Years of Education: N/A   Occupational History  . Not on file.   Social History Main Topics  . Smoking status: Former Smoker -- 1.00 packs/day for 32 years    Types: Cigarettes    Quit date: 02/03/1974  . Smokeless tobacco: Never Used  . Alcohol Use: 3.0 oz/week    5 Glasses of wine per week     Comment: 11/12/2012 "4oz wine w/dinner 5 days/wk"  . Drug Use: No  . Sexual Activity: No   Other Topics Concern  . Not on file   Social History Narrative  . No narrative on file     Family  History  Problem Relation Age of Onset  . Heart attack Father     Review of Systems: General: negative for chills, fever, night sweats or weight changes.  Cardiovascular: as per HPI. Dermatological: negative for rash Respiratory: negative for cough or wheezing Urologic: negative for hematuria Abdominal: negative for nausea, vomiting, diarrhea, bright red blood per rectum, melena, or hematemesis Neurologic: negative for visual changes, syncope, or dizziness All other systems reviewed and are otherwise negative except as noted above.  Labs:   Lab Results  Component Value Date   WBC 5.7 11/30/2012   HGB 12.2 11/30/2012   HCT 36.2 11/30/2012   MCV 91.0 11/30/2012   PLT 169 11/30/2012    Recent Labs Lab 11/24/12 0940 11/30/12 2301  NA 143 140  K 3.7 3.3*  CL  --  103  CO2 29 28  BUN 23.5 19  CREATININE 1.0 0.82  CALCIUM 9.0 8.7  PROT 6.6  --   BILITOT 0.87  --   ALKPHOS 118  --   ALT 30  --   AST 35*  --   GLUCOSE 105 120*   No results found for this basename: CKTOTAL, CKMB, TROPONINI,  in the last 72 hours Lab Results  Component Value Date   CHOL 134 10/03/2010   HDL 62.80 10/03/2010   LDLCALC 58 10/03/2010   TRIG 66.0 10/03/2010   No results found for this basename: DDIMER    Radiology/Studies:  Dg Chest 2 View  12/01/2012   CLINICAL DATA:  Chest pain  EXAM: CHEST  2 VIEW  COMPARISON:  03/17/2012  FINDINGS: Chronic cardiopericardial enlargement. An implantable device is in stable position. There are Kerley B-lines newly noted. There are lower lung opacities which appear linear in the lateral projection. Coronary artery stents present.  IMPRESSION: 1. Mild pulmonary edema. 2. Lower lung atelectasis.   Electronically Signed   By: Tiburcio Pea M.D.   On: 12/01/2012 00:05     EKG: afib with variable rate, LBBB  Physical Exam: Blood pressure 128/84, temperature 98.7  F (37.1 C), temperature source Oral, resp. rate 17, SpO2 100.00%. General: Well developed, well  nourished, in no acute distress. Head: Normocephalic, atraumatic, sclera non-icteric, nares are without discharge Neck: Supple. Negative for carotid bruits. JJVP not well visualized. Lungs: faint crackles at bases bilat Heart: irreg, no murmurs apprec Abdomen: Soft, non-tender, non-distended with normoactive bowel sounds. No rebound/guarding. No obvious abdominal masses. Msk:  Strength and tone appear normal for age. Extremities: Warm and dry, no edema. No clubbing or cyanosis. Distal pedal pulses are 2+ and equal bilaterally. Neuro: Alert and oriented X 3. Moves all extremities spontaneously. Psych:  Responds to questions appropriately with a normal affect.   1. Chest pain consistent with angina 2. Acute on chronic systolic heart failure, mild. EF of 40% by LVgram 10/10 3. Hypertension 4. Afib 5. S/p pacer 6. CAD s/p multiple stents 7. Hypokalemia  ASSESSMENT AND PLAN:  79 y.o. female w/ PMHx significant for CAD s/p multiple stents, HTN, afib, s/p pacer who [facility] to [facility] on 12/01/2012 with complaints of chest pain. History of recent stent on 10/10 to LCX.  Regarding her chest pain, her description sounds like it is similar to her underlying chronic intermittent angina and though slightly worse which required multiple nitro to suppress, I don't think that it is marked enough to call unstable angina. However, it is prudent to admit her and monitor her for ischemia/infarct given her age and recent procedure. She is on a good regimen of clopidogrel, (no aspirin due to coumadin), statin, beta blocker, long acting nitrate. In regards to further increasing her anti-anginals, could consider increase her beta blocker though will defer this to her primary cardiologist who knows her well.  She also appears to be in mild heart failure, predominantly left sided given the cxray and exam. Will continue her oral diuretics for now with a goal of  -500 ml overnight.  If she rules out for  infarct and her symptoms resolve, I anticipate that she can discharge home soon.  Replacing KCl  Prophy: On coumadin.  Code status: she states that she does not want to be coded in the event of an emergency but otherwise, would want all other therapeutic interventions.  Signed, Adolm Joseph, Lanee Chain C. MD 12/01/2012, 1:43 AM

## 2012-12-01 NOTE — Progress Notes (Signed)
Pt ambulating in room. Called nurse to room for complaints of chest pressure and SOB. Pt placed on 2L of 02 and put back in bed. Chest pressure and SOB resolved. EKG obtained. Will notify MD. Levonne Spiller, RN

## 2012-12-02 DIAGNOSIS — I5040 Unspecified combined systolic (congestive) and diastolic (congestive) heart failure: Secondary | ICD-10-CM | POA: Diagnosis not present

## 2012-12-02 DIAGNOSIS — I2510 Atherosclerotic heart disease of native coronary artery without angina pectoris: Secondary | ICD-10-CM

## 2012-12-02 DIAGNOSIS — I4891 Unspecified atrial fibrillation: Secondary | ICD-10-CM | POA: Diagnosis not present

## 2012-12-02 LAB — BASIC METABOLIC PANEL
Chloride: 104 mEq/L (ref 96–112)
Creatinine, Ser: 0.93 mg/dL (ref 0.50–1.10)
GFR calc Af Amer: 66 mL/min — ABNORMAL LOW (ref >90)
Glucose, Bld: 104 mg/dL — ABNORMAL HIGH (ref 70–99)
Potassium: 3 mEq/L — ABNORMAL LOW (ref 3.5–5.1)
Sodium: 145 mEq/L (ref 135–145)

## 2012-12-02 LAB — PROTIME-INR: Prothrombin Time: 20.3 seconds — ABNORMAL HIGH (ref 11.6–15.2)

## 2012-12-02 MED ORDER — DILTIAZEM HCL ER COATED BEADS 240 MG PO CP24: 240.0000 mg | ORAL_CAPSULE | Freq: Every day | ORAL | Status: DC

## 2012-12-02 MED ORDER — WARFARIN SODIUM 3 MG PO TABS
3.0000 mg | ORAL_TABLET | Freq: Once | ORAL | Status: DC
  Filled 2012-12-02: qty 1

## 2012-12-02 MED ORDER — FUROSEMIDE 80 MG PO TABS: 80.0000 mg | ORAL_TABLET | Freq: Every day | ORAL | Status: DC

## 2012-12-02 MED ORDER — POTASSIUM CHLORIDE CRYS ER 20 MEQ PO TBCR
40.0000 meq | EXTENDED_RELEASE_TABLET | Freq: Three times a day (TID) | ORAL | Status: DC
  Administered 2012-12-02 (×2): 40 meq via ORAL
  Filled 2012-12-02: qty 2

## 2012-12-02 NOTE — Discharge Summary (Signed)
Patient ID: KIMBERL VIG MRN: [id number] DOB/AGE: 1930/05/12 79 y.o.  Admit date: 11/30/2012 Discharge date: 12/02/2012  Primary Discharge Diagnosis acute on chronic diastolic heart failure Secondary Discharge Diagnosis atrial fibrillation, coronary artery disease, hypertension, shortness of breath  Significant Diagnostic Studies: None  Consults: None  Hospital Course: 79 year old man with known coronary artery disease and cardiomyopathy. She has had atrial fibrillation with poorly controlled rates at times. She had per her description, overdone it at cardiac rehabilitation. She felt poorly that day and had some chest discomfort at night.  The chest discomfort was different from her prior angina. She felt short of breath as well. She came to the hospital. She ruled out for MI. Her heart rate was high and therefore we increased her rate control medicines and diuresed her as well. She felt much better after a day of diuresis. She put out over 2 L of urine. Overall, she feels better.  To prevent these types of episodes, we discussed having her take an increased dose of diuretic. She will take 80 mg of Lasix in the morning daily and an additional 40 mg in the afternoon if needed.  If she has any shortness of breath, chest discomfort, fatigue, she will try an extra dose of Lasix.  Her rate control medication was also increased to hopefully help prevent any diastolic dysfunction. On the morning of discharge, she felt very well and wanted to go home.   Discharge Exam: Blood pressure 113/63, pulse 60, temperature 98.1 F (36.7 C), temperature source Oral, resp. rate 14, height 5' (1.524 m), weight 170 lb 14.4 oz (77.52 kg), SpO2 96.00%.   [place]/AT Irregularly irregular, S1, S2, normal rate Wheezing has resolved Mild obesity No edema Labs:   Lab Results  Component Value Date   WBC 5.7 11/30/2012   HGB 12.2 11/30/2012   HCT 36.2 11/30/2012   MCV 91.0 11/30/2012   PLT 169 11/30/2012      Recent Labs Lab 12/02/12 0357  NA 145  K 3.0*  CL 104  CO2 30  BUN 21  CREATININE 0.93  CALCIUM 8.7  GLUCOSE 104*   Lab Results  Component Value Date   CKTOTAL 70 06/08/2010   CKMB 2.6 06/08/2010   TROPONINI <0.30 12/01/2012    Lab Results  Component Value Date   CHOL 134 10/03/2010   CHOL 111 06/08/2010   CHOL  Value: 121        ATP III CLASSIFICATION:  <200     mg/dL   Desirable  409-811  mg/dL   Borderline High  >=914    mg/dL   High        7/82/9562   Lab Results  Component Value Date   HDL 62.80 10/03/2010   HDL 53 06/08/2010   HDL 56 07/19/2009   Lab Results  Component Value Date   LDLCALC 58 10/03/2010   LDLCALC  Value: 49        Total Cholesterol/HDL:CHD Risk Coronary Heart Disease Risk Table                     Men   Women  1/2 Average Risk   3.4   3.3  Average Risk       5.0   4.4  2 X Average Risk   9.6   7.1  3 X Average Risk  23.4   11.0        Use the calculated Patient Ratio above and the CHD Risk Table to determine  the patient's CHD Risk.        ATP III CLASSIFICATION (LDL):  <100     mg/dL   Optimal  119-147  mg/dL   Near or Above                    Optimal  130-159  mg/dL   Borderline  829-562  mg/dL   High  >130     mg/dL   Very High 09/09/5782   LDLCALC  Value: 52        Total Cholesterol/HDL:CHD Risk Coronary Heart Disease Risk Table                     Men   Women  1/2 Average Risk   3.4   3.3  Average Risk       5.0   4.4  2 X Average Risk   9.6   7.1  3 X Average Risk  23.4   11.0        Use the calculated Patient Ratio above and the CHD Risk Table to determine the patient's CHD Risk.        ATP III CLASSIFICATION (LDL):  <100     mg/dL   Optimal  696-295  mg/dL   Near or Above                    Optimal  130-159  mg/dL   Borderline  284-132  mg/dL   High  >440     mg/dL   Very High 02/05/7251   Lab Results  Component Value Date   TRIG 66.0 10/03/2010   TRIG 43 06/08/2010   TRIG 63 07/19/2009   Lab Results  Component Value Date   CHOLHDL 2 10/03/2010   CHOLHDL 2.1  06/08/2010   CHOLHDL 2.2 07/19/2009   No results found for this basename: LDLDIRECT      Radiology: Mild pulmonary edema EKG: Atrial fibrillation, left bundle branch block  FOLLOW UP PLANS AND APPOINTMENTS      Future Appointments Provider Department Dept Phone   12/03/2012 1:15 PM Mc-Phase2 Monitor 1 [facility] CARDIAC [facility] [phone]   12/06/2012 1:15 PM Mc-Phase2 Monitor 1 [facility] CARDIAC [facility] [phone]   12/07/2012 11:30 AM Everette Rank, MD [facility] [phone]   12/08/2012 12:30 PM [facility] [place] [facility] [phone]   12/08/2012 1:15 PM Mc-Phase2 Monitor 1 [facility]ARD[facility] [phone]   12/08/2012 3:00 PM Everette Rank, MD [facility] [phone]   12/10/2012 1:15 PM Mc-Phase2 Monitor 1 [facility] CARDIAC [facility] [phone]   12/13/2012 1:15 PM Mc-Phase2 Monitor 1 [facility] CARDIAC [facility] [phone]   12/15/2012 1:15 PM Mc-Phase2 Monitor 1 [facility] CARDIAC [facility] [phone]   12/17/2012 1:15 PM Mc-Phase2 Monitor 1 [facility] CARDIAC [facility] [phone]   12/20/2012 1:15 PM Mc-Phase2 Monitor 1 [facility] CARDIAC [facility] [phone]   12/22/2012 1:15 PM Mc-Phase2 Monitor 1 [facility] CARDIAC [facility] [phone]   12/24/2012 1:15 PM Mc-Phase2 Monitor 1 [facility] CARDIAC [facility] [phone]   12/27/2012 1:15 PM Mc-Phase2 Monitor 1 [facility] CARDIAC [facility] [phone]   12/29/2012 1:15 PM Mc-Phase2 Monitor 1 [facility] CARDIAC [facility] [phone]   01/03/2013 1:15 PM Mc-Phase2 Monitor 1 [facility] CARDIAC [facility] [phone]   01/05/2013 1:15 PM Mc-Phase2 Monitor  1 [facility] CARDIAC [facility] [phone]   01/07/2013 1:15 PM Mc-Phase2 Monitor 1 MOSES [facility] CARDIAC [facility] [phone]   01/10/2013 1:15 PM Mc-Phase2 Monitor 1 MOSES [facility] CARDIAC [facility] [phone]   01/12/2013 1:15 PM Mc-Phase2 Monitor 1 [facility] CARDIAC [facility] [phone]   01/14/2013 1:15 PM Mc-Phase2 Monitor 1 [facility] CARDIAC REHAB [phone]       Medication List         atorvastatin 80 MG tablet  Commonly known as:  LIPITOR  Take 1 tablet (80 mg total) by mouth every morning.     CALTRATE 600+D 600-800 MG-UNIT Tabs  Generic drug:  Calcium Carb-Cholecalciferol  Take by mouth every morning.     clopidogrel 75 MG tablet  Commonly known as:  PLAVIX  Take 1 tablet (75 mg total) by mouth daily with breakfast.     Coenzyme Q-10 100 MG capsule  Take 100 mg by mouth daily.     dextromethorphan-guaiFENesin 10-100 MG/5ML liquid  Commonly known as:  ROBITUSSIN-DM  Take 5 mLs by mouth every 4 (four) hours as needed for cough.     diltiazem 240 MG 24 hr capsule  Commonly known as:  CARDIZEM CD  Take 1 capsule (240 mg total) by mouth daily.     diphenhydrAMINE 25 MG tablet  Commonly known as:  BENADRYL  Take 25 mg by mouth every 6 (six) hours as needed for itching (and rash).     docusate sodium 100 MG capsule  Commonly known as:  COLACE  Take 100 mg by mouth daily as needed for constipation.     FISH OIL PO  Take 1,200 mg by mouth daily.     furosemide 80 MG tablet  Commonly known as:  LASIX  Take 1 tablet (80 mg total) by mouth daily.  Start taking on:  12/03/2012     hydrocortisone cream 1 %  Apply 1 application topically daily as needed (for rash).     isosorbide mononitrate 60 MG 24 hr tablet  Commonly known as:  IMDUR  Take 0.5-1 tablets (30-60 mg total) by mouth 2 (two) times daily. Take 1 tablet in the morning and 1/2 tablet in the afternoon     lisinopril 10 MG tablet  Commonly known as:  PRINIVIL,ZESTRIL  Take 0.5 tablets (5 mg total) by mouth 2 (two) times daily.      metoprolol 100 MG tablet  Commonly known as:  LOPRESSOR  Take 1 tablet (100 mg total) by mouth 2 (two) times daily.     multivitamin with minerals Tabs tablet  Take 1 tablet by mouth every morning.     nitroGLYCERIN 0.4 MG SL tablet  Commonly known as:  NITROSTAT  Place 1 tablet (0.4 mg total) under the tongue every 5 (five) minutes as needed. For chest pain     potassium chloride SA 20 MEQ tablet  Commonly known as:  K-DUR,KLOR-CON  Take 40 mEq by mouth every morning.     vitamin E 400 UNIT capsule  Take 400 Units by mouth daily as needed (for vitamin).     warfarin 2 MG tablet  Commonly known as:  COUMADIN  Take 0.5-1 tablets (1-2 mg total) by mouth See admin instructions. Take  1 tablet daily except take 1/2 tablet on sunday and tuesday         BRING ALL MEDICATIONS WITH YOU TO FOLLOW UP APPOINTMENTS  Time spent with patient to include physician time: 25  minutes Signed: Akhil Piscopo S. 12/02/2012, 1:28 PM

## 2012-12-03 ENCOUNTER — Encounter (HOSPITAL_COMMUNITY): Payer: Medicare Other

## 2012-12-03 ENCOUNTER — Encounter: Payer: Medicare Other | Admitting: Interventional Cardiology

## 2012-12-03 ENCOUNTER — Telehealth: Payer: Self-pay

## 2012-12-03 ENCOUNTER — Telehealth (HOSPITAL_COMMUNITY): Payer: Self-pay | Admitting: Cardiac Rehabilitation

## 2012-12-03 NOTE — Telephone Encounter (Signed)
New problem    patient need permission to restart cardiac rehab today.

## 2012-12-03 NOTE — Telephone Encounter (Signed)
New problem    Attaching message from [facility] for [facility].     TOC, Please make sure someone calls the pt   Thanks  Trisha

## 2012-12-03 NOTE — Telephone Encounter (Signed)
pc to pt to assess symptoms since hospital discharge

## 2012-12-03 NOTE — Telephone Encounter (Signed)
Pts husband notified and Chyrl Civatte from Cardiac Rehab had already called pt to let her know.

## 2012-12-03 NOTE — Telephone Encounter (Signed)
Please advise 

## 2012-12-03 NOTE — Telephone Encounter (Signed)
Ok to resume cardiac rehab.

## 2012-12-03 NOTE — Telephone Encounter (Signed)
To Ralene Muskrat. Please let pt know.

## 2012-12-06 ENCOUNTER — Encounter (HOSPITAL_COMMUNITY)
Admission: RE | Admit: 2012-12-06 | Discharge: 2012-12-06 | Disposition: A | Discharge status: Home / Self Care | Payer: Medicare Other | Source: Ambulatory Visit | Attending: Interventional Cardiology | Admitting: Interventional Cardiology

## 2012-12-06 DIAGNOSIS — Z9861 Coronary angioplasty status: Secondary | ICD-10-CM | POA: Insufficient documentation

## 2012-12-06 DIAGNOSIS — I252 Old myocardial infarction: Secondary | ICD-10-CM | POA: Insufficient documentation

## 2012-12-06 DIAGNOSIS — I509 Heart failure, unspecified: Secondary | ICD-10-CM | POA: Insufficient documentation

## 2012-12-06 DIAGNOSIS — I4891 Unspecified atrial fibrillation: Secondary | ICD-10-CM | POA: Diagnosis not present

## 2012-12-06 DIAGNOSIS — Z5189 Encounter for other specified aftercare: Secondary | ICD-10-CM | POA: Insufficient documentation

## 2012-12-06 DIAGNOSIS — Z7901 Long term (current) use of anticoagulants: Secondary | ICD-10-CM | POA: Insufficient documentation

## 2012-12-06 DIAGNOSIS — I10 Essential (primary) hypertension: Secondary | ICD-10-CM | POA: Diagnosis not present

## 2012-12-06 DIAGNOSIS — I2510 Atherosclerotic heart disease of native coronary artery without angina pectoris: Secondary | ICD-10-CM | POA: Diagnosis not present

## 2012-12-06 DIAGNOSIS — Z87891 Personal history of nicotine dependence: Secondary | ICD-10-CM | POA: Insufficient documentation

## 2012-12-06 NOTE — Progress Notes (Signed)
Pt returned to cardiac rehab today after her hospitalization.  Pt tolerated light activity without difficulty.  VSS, weight down 1.2kg.  Pt asymptomatic, no chest pain or dyspnea.

## 2012-12-07 ENCOUNTER — Ambulatory Visit (INDEPENDENT_AMBULATORY_CARE_PROVIDER_SITE_OTHER): Payer: Medicare Other | Admitting: Interventional Cardiology

## 2012-12-07 ENCOUNTER — Encounter: Payer: Self-pay | Admitting: Interventional Cardiology

## 2012-12-07 VITALS — BP 132/80 | HR 84 | Ht 60.0 in | Wt 169.8 lb

## 2012-12-07 DIAGNOSIS — Z79899 Other long term (current) drug therapy: Secondary | ICD-10-CM

## 2012-12-07 DIAGNOSIS — I2510 Atherosclerotic heart disease of native coronary artery without angina pectoris: Secondary | ICD-10-CM | POA: Diagnosis not present

## 2012-12-07 DIAGNOSIS — I4891 Unspecified atrial fibrillation: Secondary | ICD-10-CM | POA: Diagnosis not present

## 2012-12-07 NOTE — Progress Notes (Signed)
Patient ID: Patty [name], female   DOB: 01-03-1931, 79 y.o.   MRN: [id number]    [address] [place], [place]  [address] Phone: [phone] Fax:  [phone]  Date:  12/07/2012   ID:  Patty [name], DOB 05-Apr-1930, MRN [id number]  PCP:  Darnelle Bos, MD      History of Present Illness: Patty [name] is a 79 y.o. female who has had CAD, AFib and diastolic heart failure.  She was sent home from the hospital and has felt well on the higher dose of lasix.  She has right thigh pain, typically at rest.  No pain with walking.   She is responding to the increased lasix.  No palpitations.  Her diltiazem was increased as well.   She has returned to cardiac rehabilitation.  Overall, she feels quite well.   Wt Readings from Last 3 Encounters:  12/07/12 169 lb 12.8 oz (77.021 kg)  12/01/12 170 lb 14.4 oz (77.52 kg)  11/24/12 173 lb 8 oz (78.699 kg)     Past Medical History  Diagnosis Date  . IHD (ischemic heart disease)     Remote MI in 2000 with stent to LAD and LCX. Failed  intervention to OM in 2009. Managed medically since that time.  . Hypertension   . Hyperlipidemia   . Obesity   . Granulosa cell tumor     Has had several surgeries due to her tumor  . Diverticulitis   . Shortness of breath   . Peripheral vascular disease   . Other and unspecified angina pectoris   . MI (myocardial infarction) 06/1998    "when they were putting in the stent" (08/11/2012)  . Varicose veins   . Granulosa cell carcinoma     abd; "last episode was in 2009" (08/11/2012)  . Dysrhythmia   . Atrial flutter   . Tachycardia-bradycardia syndrome     leadless pacemaker ([name]) implanted by Dr Johney Frame  . Atrial fibrillation     "just developed this winter 2013" (08/11/2012)  . CHF (congestive heart failure)   . OSA on CPAP   . Arthritis     "touch in my fingers & right hip" (11/12/2012)  . Combined systolic and diastolic heart failure, NYHA class 3     Current Outpatient  Prescriptions  Medication Sig Dispense Refill  . atorvastatin (LIPITOR) 80 MG tablet Take 1 tablet (80 mg total) by mouth every morning.  90 tablet  2  . Calcium Carb-Cholecalciferol (CALTRATE 600+D) 600-800 MG-UNIT TABS Take by mouth every morning.      . clopidogrel (PLAVIX) 75 MG tablet Take 1 tablet (75 mg total) by mouth daily with breakfast.  30 tablet  11  . Coenzyme Q-10 100 MG capsule Take 100 mg by mouth daily.       Marland Kitchen dextromethorphan-guaiFENesin (ROBITUSSIN-DM) 10-100 MG/5ML liquid Take 5 mLs by mouth every 4 (four) hours as needed for cough.      . diltiazem (CARDIZEM CD) 240 MG 24 hr capsule Take 1 capsule (240 mg total) by mouth daily.  30 capsule  11  . diphenhydrAMINE (BENADRYL) 25 MG tablet Take 25 mg by mouth every 6 (six) hours as needed for itching (and rash).      Marland Kitchen docusate sodium (COLACE) 100 MG capsule Take 100 mg by mouth daily as needed for constipation.      . furosemide (LASIX) 80 MG tablet Take 80 mg by mouth daily. Take an additional 40mg  qpm PRN dyspnea,  edema      . hydrocortisone cream 1 % Apply 1 application topically daily as needed (for rash).      . isosorbide mononitrate (IMDUR) 60 MG 24 hr tablet Take 0.5-1 tablets (30-60 mg total) by mouth 2 (two) times daily. Take 1 tablet in the morning and 1/2 tablet in the afternoon  135 tablet  1  . lisinopril (PRINIVIL,ZESTRIL) 10 MG tablet Take 0.5 tablets (5 mg total) by mouth 2 (two) times daily.  90 tablet  2  . metoprolol (LOPRESSOR) 100 MG tablet Take 1 tablet (100 mg total) by mouth 2 (two) times daily.  90 tablet  2  . Multiple Vitamin (MULTIVITAMIN WITH MINERALS) TABS tablet Take 1 tablet by mouth every morning.      . nitroGLYCERIN (NITROSTAT) 0.4 MG SL tablet Place 1 tablet (0.4 mg total) under the tongue every 5 (five) minutes as needed. For chest pain  35 tablet  11  . Omega-3 Fatty Acids (FISH OIL PO) Take 1,200 mg by mouth daily.       . potassium chloride SA (K-DUR,KLOR-CON) 20 MEQ tablet Take 20 mEq by  mouth every morning. Take 2 tabs qam      . vitamin E 400 UNIT capsule Take 400 Units by mouth daily as needed (for vitamin).       Marland Kitchen warfarin (COUMADIN) 2 MG tablet Take 0.5-1 tablets (1-2 mg total) by mouth See admin instructions. Take  1 tablet daily except take 1/2 tablet on sunday and tuesday  90 tablet  1   No current facility-administered medications for this visit.    Allergies:    Allergies  Allergen Reactions  . Bee Venom Anaphylaxis  . Adhesive [Tape]   . Amoxicillin Diarrhea and Nausea And Vomiting  . Other     Pain medications cause severe vomiting.    Social History:  The patient  reports that she quit smoking about 38 years ago. Her smoking use included Cigarettes. She has a 32 pack-year smoking history. She has never used smokeless tobacco. She reports that she drinks about 3.0 ounces of alcohol per week. She reports that she does not use illicit drugs.   Family History:  The patient's family history includes Diabetes in her brother and father; Heart attack in her brother and father; Hypertension in her brother and father; Kidney failure in her brother; Stroke in her mother.   ROS:  Please see the history of present illness.  No nausea, vomiting.  No fevers, chills.  No focal weakness.  No dysuria.    All other systems reviewed and negative.   PHYSICAL EXAM: VS:  BP 132/80  Pulse 84  Ht 5' (1.524 m)  Wt 169 lb 12.8 oz (77.021 kg)  BMI 33.16 kg/m2  SpO2 96% Well nourished, well developed, in no acute distress HEENT: normal Neck: no JVD, no carotid bruits Cardiac:  normal S1, S2; irregularly irregular Lungs:  clear to auscultation bilaterally, no wheezing, rhonchi or rales Abd: soft, nontender, no hepatomegaly Ext: no edema Skin: warm and dry Neuro:   no focal abnormalities noted       ASSESSMENT AND PLAN:  1. CAD: Continue dual antiplatelet therapy. She has no further angina. I think some of her chest discomfort was due to fluid overload. 2. Diastolic  dysfunction:  Much better controlled on higher dosage of Lasix. Will check labs today. 3. AFib- better rate control on increased diltiazem. 4. Leg pain.  If potassium normal, plan for ABIs/LE Doppler.  If low,  will increase home dose of potassium.  Right leg pain is not typical of claudication but given her history of vascular disease, would check ABIs.   Signed, Fredric Mare, MD, [facility] 12/07/2012 12:18 PM

## 2012-12-07 NOTE — Patient Instructions (Addendum)
Your physician recommends that you return for lab work tomorrow 12/08/12 for Bmet.  Your physician recommends that you schedule a follow-up appointment in 3 months.  Your physician recommends that you continue on your current medications as directed. Please refer to the Current Medication list given to you today. ]

## 2012-12-08 ENCOUNTER — Other Ambulatory Visit (INDEPENDENT_AMBULATORY_CARE_PROVIDER_SITE_OTHER): Payer: Medicare Other

## 2012-12-08 ENCOUNTER — Ambulatory Visit (INDEPENDENT_AMBULATORY_CARE_PROVIDER_SITE_OTHER): Payer: Medicare Other | Admitting: Pharmacist

## 2012-12-08 ENCOUNTER — Encounter (HOSPITAL_COMMUNITY)
Admission: RE | Admit: 2012-12-08 | Discharge: 2012-12-08 | Disposition: A | Discharge status: Home / Self Care | Payer: Medicare Other | Source: Ambulatory Visit | Attending: Interventional Cardiology | Admitting: Interventional Cardiology

## 2012-12-08 ENCOUNTER — Ambulatory Visit: Payer: Medicare Other | Admitting: Interventional Cardiology

## 2012-12-08 DIAGNOSIS — Z79899 Other long term (current) drug therapy: Secondary | ICD-10-CM

## 2012-12-08 DIAGNOSIS — I4891 Unspecified atrial fibrillation: Secondary | ICD-10-CM

## 2012-12-08 DIAGNOSIS — G4733 Obstructive sleep apnea (adult) (pediatric): Secondary | ICD-10-CM | POA: Diagnosis not present

## 2012-12-08 DIAGNOSIS — I10 Essential (primary) hypertension: Secondary | ICD-10-CM | POA: Diagnosis not present

## 2012-12-08 LAB — BASIC METABOLIC PANEL
Calcium: 8.7 mg/dL (ref 8.4–10.5)
Chloride: 104 mEq/L (ref 96–112)
GFR: 52.39 mL/min — ABNORMAL LOW (ref >60.00)
Glucose, Bld: 139 mg/dL — ABNORMAL HIGH (ref 70–99)
Potassium: 3.5 mEq/L (ref 3.5–5.1)
Sodium: 142 mEq/L (ref 135–145)

## 2012-12-09 ENCOUNTER — Telehealth: Payer: Self-pay | Admitting: Interventional Cardiology

## 2012-12-09 DIAGNOSIS — I739 Peripheral vascular disease, unspecified: Secondary | ICD-10-CM

## 2012-12-09 NOTE — Telephone Encounter (Signed)
Also, pt wants to make sure she can have cataract surgery in December or January. Please advise.

## 2012-12-09 NOTE — Telephone Encounter (Signed)
New message    Talk to Patty [name]---want lab results.  Will she need to have an echo?

## 2012-12-09 NOTE — Telephone Encounter (Signed)
lmtrc

## 2012-12-09 NOTE — Telephone Encounter (Signed)
Spoke with pt and she started having pain in her right leg about 1 week ago. Pt states she told Dr. Eldridge Dace and he wanted to make sure labwork was okay before we possibly proceeded with LE ultrasound. Dr. Eldridge Dace would you like for pt to have LE arterial/venous duplex? Pt states the pain is better and she hasn't had the pain today, but she has taken some tylenol lately, which seemed to help. Pt states the pain is intermittent.

## 2012-12-09 NOTE — Telephone Encounter (Signed)
New Problem  Pt asks if she will need a stress test/

## 2012-12-10 ENCOUNTER — Encounter (HOSPITAL_COMMUNITY)
Admission: RE | Admit: 2012-12-10 | Discharge: 2012-12-10 | Disposition: A | Discharge status: Home / Self Care | Payer: Medicare Other | Source: Ambulatory Visit | Attending: Interventional Cardiology | Admitting: Interventional Cardiology

## 2012-12-13 ENCOUNTER — Encounter (HOSPITAL_COMMUNITY)
Admission: RE | Admit: 2012-12-13 | Discharge: 2012-12-13 | Disposition: A | Discharge status: Home / Self Care | Payer: Medicare Other | Source: Ambulatory Visit | Attending: Interventional Cardiology | Admitting: Interventional Cardiology

## 2012-12-13 NOTE — Telephone Encounter (Signed)
If pain returns, schedule for LE arterial DOppler.  If pain does not come back, ok to skip the Doppler.

## 2012-12-14 ENCOUNTER — Telehealth: Payer: Self-pay | Admitting: Interventional Cardiology

## 2012-12-14 NOTE — Telephone Encounter (Signed)
Lmtrc, Can pt have Cataract surgery in December?

## 2012-12-14 NOTE — Addendum Note (Signed)
Addended byOrlene Plum H on: 12/14/2012 04:41 PM   Modules accepted: Orders

## 2012-12-14 NOTE — Telephone Encounter (Signed)
Pt scheduled for LE Arterial Duplex on 12/15/12 at 11:30am. Pt still needs an okay from Dr. Eldridge Dace for cataract surgery.

## 2012-12-14 NOTE — Telephone Encounter (Signed)
Returned pt's call.

## 2012-12-14 NOTE — Telephone Encounter (Signed)
New Problem:  Pt states she is returning Amy's call.

## 2012-12-14 NOTE — Telephone Encounter (Signed)
Pt states the pain is intermittent and it helps at times. Pt actually had the pain last night.

## 2012-12-15 ENCOUNTER — Encounter (HOSPITAL_COMMUNITY)
Admission: RE | Admit: 2012-12-15 | Discharge: 2012-12-15 | Disposition: A | Discharge status: Home / Self Care | Payer: Medicare Other | Source: Ambulatory Visit | Attending: Interventional Cardiology | Admitting: Interventional Cardiology

## 2012-12-15 ENCOUNTER — Ambulatory Visit (HOSPITAL_COMMUNITY): Payer: Medicare Other | Attending: Interventional Cardiology

## 2012-12-15 DIAGNOSIS — I10 Essential (primary) hypertension: Secondary | ICD-10-CM | POA: Diagnosis not present

## 2012-12-15 DIAGNOSIS — Z87891 Personal history of nicotine dependence: Secondary | ICD-10-CM | POA: Insufficient documentation

## 2012-12-15 DIAGNOSIS — I70219 Atherosclerosis of native arteries of extremities with intermittent claudication, unspecified extremity: Secondary | ICD-10-CM | POA: Diagnosis not present

## 2012-12-15 DIAGNOSIS — I2510 Atherosclerotic heart disease of native coronary artery without angina pectoris: Secondary | ICD-10-CM | POA: Insufficient documentation

## 2012-12-15 DIAGNOSIS — E785 Hyperlipidemia, unspecified: Secondary | ICD-10-CM | POA: Insufficient documentation

## 2012-12-17 ENCOUNTER — Encounter (HOSPITAL_COMMUNITY)
Admission: RE | Admit: 2012-12-17 | Discharge: 2012-12-17 | Disposition: A | Discharge status: Home / Self Care | Payer: Medicare Other | Source: Ambulatory Visit | Attending: Interventional Cardiology | Admitting: Interventional Cardiology

## 2012-12-17 NOTE — Telephone Encounter (Signed)
Returned pts call with LE doppler results.

## 2012-12-17 NOTE — Telephone Encounter (Signed)
Follow Up ° °Pt returning call about results.  °

## 2012-12-20 ENCOUNTER — Encounter (HOSPITAL_COMMUNITY)
Admission: RE | Admit: 2012-12-20 | Discharge: 2012-12-20 | Disposition: A | Discharge status: Home / Self Care | Payer: Medicare Other | Source: Ambulatory Visit | Attending: Interventional Cardiology | Admitting: Interventional Cardiology

## 2012-12-22 ENCOUNTER — Encounter (HOSPITAL_COMMUNITY)
Admission: RE | Admit: 2012-12-22 | Discharge: 2012-12-22 | Disposition: A | Discharge status: Home / Self Care | Payer: Medicare Other | Source: Ambulatory Visit | Attending: Interventional Cardiology | Admitting: Interventional Cardiology

## 2012-12-22 ENCOUNTER — Other Ambulatory Visit: Payer: Self-pay | Admitting: Interventional Cardiology

## 2012-12-23 ENCOUNTER — Encounter: Payer: Self-pay | Admitting: Internal Medicine

## 2012-12-23 ENCOUNTER — Ambulatory Visit (INDEPENDENT_AMBULATORY_CARE_PROVIDER_SITE_OTHER): Payer: Medicare Other | Admitting: Internal Medicine

## 2012-12-23 VITALS — BP 118/78 | HR 84 | Ht 60.0 in | Wt 170.1 lb

## 2012-12-23 DIAGNOSIS — I495 Sick sinus syndrome: Secondary | ICD-10-CM | POA: Diagnosis not present

## 2012-12-23 DIAGNOSIS — I4891 Unspecified atrial fibrillation: Secondary | ICD-10-CM | POA: Diagnosis not present

## 2012-12-24 ENCOUNTER — Encounter (HOSPITAL_COMMUNITY)
Admission: RE | Admit: 2012-12-24 | Discharge: 2012-12-24 | Disposition: A | Discharge status: Home / Self Care | Payer: Medicare Other | Source: Ambulatory Visit | Attending: Interventional Cardiology | Admitting: Interventional Cardiology

## 2012-12-24 MED ORDER — METOPROLOL TARTRATE 50 MG PO TABS: ORAL_TABLET | ORAL | Status: DC

## 2012-12-24 NOTE — Telephone Encounter (Signed)
Patient called the research office today . We saw her for unscheduled visit yesterday with Dr.Allred. Dr.Allred lowered basic rate to 40 since patient was pacing more. Patient called to tell me her heart rate was 42 and B/P 149/75. Patient stated blood pressure was usually running 116/75-to 125/70. Patient was concerned she has cardiac rehab and would not be able to exercise. Patient stated was asymptomatic. I spoke to [facility] and she asked me to route message to Dr. Eldridge Dace .I informed patient to go to rehab and they could call him if an issue.

## 2012-12-24 NOTE — Progress Notes (Signed)
Pt heart rate in 40's at cardiac rehab today during light exercise (using hand weights and stretching)  Pt asymptomatic. Highest rate with exercise 67.  Lowest rate 40 at rest.  PC to Amy, Dr. Hoyle Barr nurse.  [employer] faxed for review.  Amy called and spoke to pt.  Per pt she was advised to decrease metoprolol to 75mg  BID. Pt understands she will need to cut 100mg  in half then quarter to make 75mg .

## 2012-12-24 NOTE — Telephone Encounter (Signed)
[name] from Cardiac Rehab called stating pts HR was in the 40's at rehab today. Pt was asymptomatic. Per Dr. Eldridge Dace pt should decrease lopressor to 75mg  BID. Pt notified.

## 2012-12-24 NOTE — Addendum Note (Signed)
Addended byOrlene Plum H on: 12/24/2012 03:27 PM   Modules accepted: Orders

## 2012-12-25 NOTE — Progress Notes (Signed)
PCP: Darnelle Bos, MD Primary Cardiologist:  Dr Louretta Shorten Patty [name] is a 79 y.o. female who presents today for routine electrophysiology followup. She was recently hospitalized with afib and RVR with associated heart failure.  Her diltiazem was increased.  She has since developed increased V pacing.  Her CHF has improved with medical therapy for afib with RVR.  Today she says that she feels "great".  Today, she denies symptoms of palpitations, exertional chest pain, shortness of breath,  lower extremity edema, dizziness, presyncope, or syncope.  The patient is otherwise without complaint today.   Past Medical History  Diagnosis Date  . IHD (ischemic heart disease)     Remote MI in 2000 with stent to LAD and LCX. Failed  intervention to OM in 2009. Managed medically since that time.  . Hypertension   . Hyperlipidemia   . Obesity   . Granulosa cell tumor     Has had several surgeries due to her tumor  . Diverticulitis   . Shortness of breath   . Peripheral vascular disease   . Other and unspecified angina pectoris   . MI (myocardial infarction) 06/1998    "when they were putting in the stent" (08/11/2012)  . Varicose veins   . Granulosa cell carcinoma     abd; "last episode was in 2009" (08/11/2012)  . Dysrhythmia   . Atrial flutter   . Tachycardia-bradycardia syndrome     leadless pacemaker ([name]) implanted by Dr Johney Frame  . Atrial fibrillation     "just developed this winter 2013" (08/11/2012)  . CHF (congestive heart failure)   . OSA on CPAP   . Arthritis     "touch in my fingers & right hip" (11/12/2012)  . Combined systolic and diastolic heart failure, NYHA class 3    Past Surgical History  Procedure Laterality Date  . Cholecystectomy  1980's  . Salivary gland surgery  2000's    "had a little lump removed; granulosa related; it was benign" (08/11/2012)  . US echocardiography  11/21/2003    EF 55-60%  . Cardiovascular stress test  03/23/2009    EF 72%  . Abdominal  hysterectomy    . Colon surgery  2004    colectomy for diverticulosis  . Granulosa tumor excision  2000; 2003; 2004; 2007    "all in my abdomen including small intestines, outside my ?uterus/etc" (08/11/2012)  . Diagnostic laparoscopy      gallbladder removal; abdominal hernia repair  . Tee without cardioversion  12/31/2011    Procedure: TRANSESOPHAGEAL ECHOCARDIOGRAM (TEE);  Surgeon: Corky Crafts, MD;  Location: [facility] ENDOSCOPY;  Service: Cardiovascular;  Laterality: N/A;  . Cardioversion  12/31/2011    Procedure: CARDIOVERSION;  Surgeon: Corky Crafts, MD;  Location: [facility]OSCOPY;  Service: Cardiovascular;  Laterality: N/A;  . Pacemaker insertion  2/11/4    [name] (SJM) leadless pacemaker (LEADLESS II STUDY PATEINT)  . Cardiac catheterization  09/03/2007    EF 70%; Failed attempt at PCI to OM  . Cardiac catheterization  11/01/2003    EF 70%  . Coronary angioplasty    . Coronary angioplasty with stent placement  2000; 08/11/2012; 11/12/2012    "3 + 2 +1; total of 6" (11/12/2012)  . Insert / replace / remove pacemaker  03/16/2012    [name] (SJM) leadless pacemaker (LEADLESS II STUDY PATEINT)  . Varicose vein surgery Bilateral 1977  . Hernia repair  2005    "laparoscopic"    Current Outpatient Prescriptions  Medication Sig Dispense  Refill  . atorvastatin (LIPITOR) 80 MG tablet Take 1 tablet (80 mg total) by mouth every morning.  90 tablet  2  . Calcium Carb-Cholecalciferol (CALTRATE 600+D) 600-800 MG-UNIT TABS Take by mouth every morning.      . clopidogrel (PLAVIX) 75 MG tablet Take 1 tablet (75 mg total) by mouth daily with breakfast.  30 tablet  11  . Coenzyme Q-10 100 MG capsule Take 100 mg by mouth daily.       Marland Kitchen dextromethorphan-guaiFENesin (ROBITUSSIN-DM) 10-100 MG/5ML liquid Take 5 mLs by mouth every 4 (four) hours as needed for cough.      . diltiazem (CARDIZEM CD) 240 MG 24 hr capsule Take 1 capsule (240 mg total) by mouth daily.  30 capsule  11  . diphenhydrAMINE  (BENADRYL) 25 MG tablet Take 25 mg by mouth every 6 (six) hours as needed for itching (and rash).      Marland Kitchen docusate sodium (COLACE) 100 MG capsule Take 100 mg by mouth daily as needed for constipation.      . furosemide (LASIX) 80 MG tablet Take 80 mg by mouth daily. Take an additional 40mg  qpm PRN dyspnea, edema      . hydrocortisone cream 1 % Apply 1 application topically daily as needed (for rash).      . isosorbide mononitrate (IMDUR) 60 MG 24 hr tablet Take 0.5-1 tablets (30-60 mg total) by mouth 2 (two) times daily. Take 1 tablet in the morning and 1/2 tablet in the afternoon  135 tablet  1  . lisinopril (PRINIVIL,ZESTRIL) 10 MG tablet Take 0.5 tablets (5 mg total) by mouth 2 (two) times daily.  90 tablet  2  . Multiple Vitamin (MULTIVITAMIN WITH MINERALS) TABS tablet Take 1 tablet by mouth every morning.      . nitroGLYCERIN (NITROSTAT) 0.4 MG SL tablet Place 1 tablet (0.4 mg total) under the tongue every 5 (five) minutes as needed. For chest pain  35 tablet  11  . Omega-3 Fatty Acids (FISH OIL PO) Take 1,200 mg by mouth daily.       . potassium chloride SA (K-DUR,KLOR-CON) 20 MEQ tablet Take 20 mEq by mouth every morning. Take 2 tabs qam      . vitamin Patty 400 UNIT capsule Take 400 Units by mouth daily as needed (for vitamin).       Marland Kitchen warfarin (COUMADIN) 2 MG tablet Take 0.5-1 tablets (1-2 mg total) by mouth See admin instructions. Take  1 tablet daily except take 1/2 tablet on sunday and tuesday  90 tablet  1  . metoprolol (LOPRESSOR) 50 MG tablet 1 and 1/2 tablet po BID  90 tablet  2   No current facility-administered medications for this visit.    Physical Exam: Filed Vitals:   12/23/12 1639  BP: 118/78  Pulse: 84  Height: 5' (1.524 m)  Weight: 170 lb 1.9 oz (77.166 kg)    GEN- The patient is well appearing, alert and oriented x 3 today.   Head- normocephalic, atraumatic Eyes-  Sclera clear, conjunctiva pink Ears- hearing intact Oropharynx- clear Lungs- Clear to ausculation  bilaterally, normal work of breathing Heart- Regular rate and rhythm, no murmurs, rubs or gallops, PMI not laterally displaced GI- soft, NT, ND, + BS Extremities- no clubbing, cyanosis, or edema  Pacemaker interrogation- reviewed in detail today,  See PACEART report  ekg today reveals sinus, V paced at 60 bpm (VVI)  Assessment and Plan:  1. Tachycardia/ Bradycardia Normal pacemaker function See Pace Art report No  changes today Today I have decreased her pacing rate to 40 bpm to promote AV synchrony.  No other changes are made today.  2. afib Continue rate control and anticoagulation long term  Return to see me in 3 months Follow-up with Dr Abe People as scheduled

## 2012-12-27 ENCOUNTER — Encounter (HOSPITAL_COMMUNITY): Payer: Self-pay

## 2012-12-27 ENCOUNTER — Encounter (HOSPITAL_COMMUNITY)
Admission: RE | Admit: 2012-12-27 | Discharge: 2012-12-27 | Disposition: A | Discharge status: Home / Self Care | Payer: Medicare Other | Source: Ambulatory Visit | Attending: Interventional Cardiology | Admitting: Interventional Cardiology

## 2012-12-27 NOTE — Progress Notes (Signed)
Pt graduated from cardiac rehab program today.  Medication list reconciled.  PHQ9 score-0  .  Pt has made significant lifestyle changes and should be commended for her success. Pt plans to continue exercise on her own at [facility] walking and [employer]. Pt loves to swim and plans to attend [employer] water aerobics.  Pt HR continues to be in 40s at rest and with exercise. Pt reports she decreased Lopressor to 75mg  BID as instructed.  Pt asymptomatic. Dr. Eldridge Dace made aware.

## 2012-12-28 ENCOUNTER — Telehealth: Payer: Self-pay | Admitting: Interventional Cardiology

## 2012-12-28 NOTE — Telephone Encounter (Signed)
Called pt and pt wanted to let us know that her HR has been running in the mid 40's and low 50's. Pt did call and let [facility] know. Pt has been feeling okay. She feels sluggish and tired at time. This morning pt had a episode of lightheadedness and she drank some water. After 15 minutes the episode had passed. I spoke with with Maureen Ralphs and he would like her to only take Metoprolol 25 MG tonight and have her pacemaker checked in the am. Maureen Ralphs will call her with appt and pt notified of med change for just tonight.

## 2012-12-28 NOTE — Telephone Encounter (Signed)
I spoke to patient and asked her to take Metoprolol 25mg  tonight per Dr Johney Frame. We will see patient in the clinic on 12/29/12 at 10:00am to check her pacemaker. Patient confirmed appointment.

## 2012-12-28 NOTE — Telephone Encounter (Signed)
New Problem:  Pt states she would like to discuss her treatment with Amy.

## 2012-12-29 ENCOUNTER — Ambulatory Visit (INDEPENDENT_AMBULATORY_CARE_PROVIDER_SITE_OTHER): Payer: Medicare Other | Admitting: Internal Medicine

## 2012-12-29 ENCOUNTER — Encounter (HOSPITAL_COMMUNITY): Payer: Medicare Other

## 2012-12-29 ENCOUNTER — Encounter: Payer: Self-pay | Admitting: Internal Medicine

## 2012-12-29 VITALS — BP 157/65 | HR 56 | Ht 60.0 in | Wt 170.4 lb

## 2012-12-29 DIAGNOSIS — I495 Sick sinus syndrome: Secondary | ICD-10-CM | POA: Diagnosis not present

## 2012-12-29 DIAGNOSIS — I4891 Unspecified atrial fibrillation: Secondary | ICD-10-CM | POA: Diagnosis not present

## 2012-12-29 LAB — MDC_IDC_ENUM_SESS_TYPE_INCLINIC
Implantable Pulse Generator Serial Number: 1096
Lead Channel Pacing Threshold Amplitude: 0.5 V
Lead Channel Pacing Threshold Pulse Width: 0.4 ms

## 2012-12-29 MED ORDER — METOPROLOL TARTRATE 25 MG PO TABS: ORAL_TABLET | ORAL | Status: DC

## 2012-12-29 NOTE — Patient Instructions (Signed)
Your physician recommends that you schedule a follow-up appointment in: 2 weeks with Dr Johney Frame  Your physician has recommended you make the following change in your medication:  1) Decrease Metoprolol to 25mg  twice daily

## 2012-12-30 NOTE — Progress Notes (Signed)
PCP: Darnelle Bos, MD Primary Cardiologist:  Dr Patty [name] is a 79 y.o. female who presents today for electrophysiology followup.  Last week, I reprogrammed VVI 40 to minimize V pacing.  She reports that since making this change that her energy has significantly declined.  Today, she denies symptoms of palpitations, exertional chest pain, shortness of breath,  lower extremity edema, dizziness, presyncope, or syncope.  The patient is otherwise without complaint today.   Past Medical History  Diagnosis Date  . IHD (ischemic heart disease)     Remote MI in 2000 with stent to LAD and LCX. Failed  intervention to OM in 2009. Managed medically since that time.  . Hypertension   . Hyperlipidemia   . Obesity   . Granulosa cell tumor     Has had several surgeries due to her tumor  . Diverticulitis   . Shortness of breath   . Peripheral vascular disease   . Other and unspecified angina pectoris   . MI (myocardial infarction) 06/1998    "when they were putting in the stent" (08/11/2012)  . Varicose veins   . Granulosa cell carcinoma     abd; "last episode was in 2009" (08/11/2012)  . Dysrhythmia   . Atrial flutter   . Tachycardia-bradycardia syndrome     leadless pacemaker ([name]) implanted by Dr Johney Frame  . Atrial fibrillation     "just developed this winter 2013" (08/11/2012)  . CHF (congestive heart failure)   . OSA on CPAP   . Arthritis     "touch in my fingers & right hip" (11/12/2012)  . Combined systolic and diastolic heart failure, NYHA class 3    Past Surgical History  Procedure Laterality Date  . Cholecystectomy  1980's  . Salivary gland surgery  2000's    "had a little lump removed; granulosa related; it was benign" (08/11/2012)  . US echocardiography  11/21/2003    EF 55-60%  . Cardiovascular stress test  03/23/2009    EF 72%  . Abdominal hysterectomy    . Colon surgery  2004    colectomy for diverticulosis  . Granulosa tumor excision  2000; 2003; 2004;  2007    "all in my abdomen including small intestines, outside my ?uterus/etc" (08/11/2012)  . Diagnostic laparoscopy      gallbladder removal; abdominal hernia repair  . Tee without cardioversion  12/31/2011    Procedure: TRANSESOPHAGEAL ECHOCARDIOGRAM (TEE);  Surgeon: Corky Crafts, MD;  Location: [facility] ENDOSCOPY;  Service: Cardiovascular;  Laterality: N/A;  . Cardioversion  12/31/2011    Procedure: CARDIOVERSION;  Surgeon: Corky Crafts, MD;  Location: [facility] ENDOSCOPY;  Service: Cardiovascular;  Laterality: N/A;  . Pacemaker insertion  2/11/4    [name] (SJM) leadless pacemaker (LEADLESS II STUDY PATEINT)  . Cardiac catheterization  09/03/2007    EF 70%; Failed attempt at PCI to OM  . Cardiac catheterization  11/01/2003    EF 70%  . Coronary angioplasty    . Coronary angioplasty with stent placement  2000; 08/11/2012; 11/12/2012    "3 + 2 +1; total of 6" (11/12/2012)  . Insert / replace / remove pacemaker  03/16/2012    [name] (SJM) leadless pacemaker (LEADLESS II STUDY PATEINT)  . Varicose vein surgery Bilateral 1977  . Hernia repair  2005    "laparoscopic"    Current Outpatient Prescriptions  Medication Sig Dispense Refill  . atorvastatin (LIPITOR) 80 MG tablet Take 1 tablet (80 mg total) by mouth every morning.  90 tablet  2  . Calcium Carb-Cholecalciferol (CALTRATE 600+D) 600-800 MG-UNIT TABS Take by mouth every morning.      . clopidogrel (PLAVIX) 75 MG tablet Take 1 tablet (75 mg total) by mouth daily with breakfast.  30 tablet  11  . Coenzyme Q-10 100 MG capsule Take 100 mg by mouth daily.       Marland Kitchen dextromethorphan-guaiFENesin (ROBITUSSIN-DM) 10-100 MG/5ML liquid Take 5 mLs by mouth every 4 (four) hours as needed for cough.      . diltiazem (CARDIZEM CD) 240 MG 24 hr capsule Take 1 capsule (240 mg total) by mouth daily.  30 capsule  11  . diphenhydrAMINE (BENADRYL) 25 MG tablet Take 25 mg by mouth every 6 (six) hours as needed for itching (and rash).      Marland Kitchen docusate  sodium (COLACE) 100 MG capsule Take 100 mg by mouth daily as needed for constipation.      . furosemide (LASIX) 80 MG tablet Take 80 mg by mouth daily. Take an additional 40mg  qpm PRN dyspnea, edema      . hydrocortisone cream 1 % Apply 1 application topically daily as needed (for rash).      . isosorbide mononitrate (IMDUR) 60 MG 24 hr tablet Take 0.5-1 tablets (30-60 mg total) by mouth 2 (two) times daily. Take 1 tablet in the morning and 1/2 tablet in the afternoon  135 tablet  1  . lisinopril (PRINIVIL,ZESTRIL) 10 MG tablet Take 0.5 tablets (5 mg total) by mouth 2 (two) times daily.  90 tablet  2  . metoprolol (LOPRESSOR) 25 MG tablet 1/2 tablet po BID  90 tablet  2  . Multiple Vitamin (MULTIVITAMIN WITH MINERALS) TABS tablet Take 1 tablet by mouth every morning.      . nitroGLYCERIN (NITROSTAT) 0.4 MG SL tablet Place 1 tablet (0.4 mg total) under the tongue every 5 (five) minutes as needed. For chest pain  35 tablet  11  . Omega-3 Fatty Acids (FISH OIL PO) Take 1,200 mg by mouth daily.       . potassium chloride SA (K-DUR,KLOR-CON) 20 MEQ tablet Take 20 mEq by mouth every morning. Take 2 tabs qam      . vitamin E 400 UNIT capsule Take 400 Units by mouth daily as needed (for vitamin).       Marland Kitchen warfarin (COUMADIN) 2 MG tablet Take 0.5-1 tablets (1-2 mg total) by mouth See admin instructions. Take  1 tablet daily except take 1/2 tablet on sunday and tuesday  90 tablet  1   No current facility-administered medications for this visit.    Physical Exam: Filed Vitals:   12/29/12 0953  BP: 157/65  Pulse: 56  Height: 5' (1.524 m)  Weight: 170 lb 6.4 oz (77.293 kg)    GEN- The patient is well appearing, alert and oriented x 3 today.   Head- normocephalic, atraumatic Eyes-  Sclera clear, conjunctiva pink Ears- hearing intact Oropharynx- clear Lungs- Clear to ausculation bilaterally, normal work of breathing Heart- Regular rate and rhythm, no murmurs, rubs or gallops, PMI not laterally  displaced GI- soft, NT, ND, + BS Extremities- no clubbing, cyanosis, or edema  Pacemaker interrogation- reviewed in detail today,  See PACEART report  ekg today reveals sinus bradycardia with IVCD and occasional junctional bradycardia, demand V pacing  Assessment and Plan:  1. Tachycardia/ Bradycardia Normal pacemaker function See Pace Art report No changes today Today I have increased her pacing rate to 50 bpm due to symptomatic bradycardia.  I will decrease  metoprolol to 25mg  BID also.  2. afib Continue rate control and anticoagulation long term  Return to see me in 2 weeks Follow-up with Dr Abe People as scheduled

## 2012-12-31 ENCOUNTER — Encounter: Payer: Self-pay | Admitting: Internal Medicine

## 2013-01-03 ENCOUNTER — Encounter (HOSPITAL_COMMUNITY): Payer: Medicare Other

## 2013-01-05 ENCOUNTER — Encounter (HOSPITAL_COMMUNITY): Payer: Medicare Other

## 2013-01-06 ENCOUNTER — Ambulatory Visit (INDEPENDENT_AMBULATORY_CARE_PROVIDER_SITE_OTHER): Payer: Medicare Other | Admitting: Pharmacist

## 2013-01-06 ENCOUNTER — Encounter: Payer: Self-pay | Admitting: Interventional Cardiology

## 2013-01-06 DIAGNOSIS — I4891 Unspecified atrial fibrillation: Secondary | ICD-10-CM

## 2013-01-06 LAB — POCT INR: INR: 3.1

## 2013-01-07 ENCOUNTER — Encounter (HOSPITAL_COMMUNITY): Payer: Medicare Other

## 2013-01-10 ENCOUNTER — Encounter (HOSPITAL_COMMUNITY): Payer: Medicare Other

## 2013-01-10 ENCOUNTER — Encounter: Payer: Medicare Other | Admitting: Internal Medicine

## 2013-01-12 ENCOUNTER — Ambulatory Visit (INDEPENDENT_AMBULATORY_CARE_PROVIDER_SITE_OTHER): Payer: Medicare Other | Admitting: Internal Medicine

## 2013-01-12 ENCOUNTER — Encounter: Payer: Self-pay | Admitting: Internal Medicine

## 2013-01-12 ENCOUNTER — Encounter (HOSPITAL_COMMUNITY): Payer: Medicare Other

## 2013-01-12 VITALS — BP 138/70 | HR 66 | Ht 60.0 in | Wt 169.8 lb

## 2013-01-12 DIAGNOSIS — I4891 Unspecified atrial fibrillation: Secondary | ICD-10-CM | POA: Diagnosis not present

## 2013-01-12 DIAGNOSIS — I495 Sick sinus syndrome: Secondary | ICD-10-CM | POA: Diagnosis not present

## 2013-01-12 NOTE — Progress Notes (Signed)
PCP: Darnelle Bos, MD Primary Cardiologist:  Dr Louretta Shorten [name] [name] is a 79 y.o. female who presents today for electrophysiology followup.  Last week, I reprogrammed VVI 40 to minimize V pacing.  She reports that since making this change that her energy has significantly declined.  Today, she denies symptoms of palpitations, exertional chest pain, shortness of breath,  lower extremity edema, dizziness, presyncope, or syncope.  The patient is otherwise without complaint today.   Past Medical History  Diagnosis Date  . IHD (ischemic heart disease)     Remote MI in 2000 with stent to LAD and LCX. Failed  intervention to OM in 2009. Managed medically since that time.  . Hypertension   . Hyperlipidemia   . Obesity   . Granulosa cell tumor     Has had several surgeries due to her tumor  . Diverticulitis   . Shortness of breath   . Peripheral vascular disease   . Other and unspecified angina pectoris   . MI (myocardial infarction) 06/1998    "when they were putting in the stent" (08/11/2012)  . Varicose veins   . Granulosa cell carcinoma     abd; "last episode was in 2009" (08/11/2012)  . Dysrhythmia   . Atrial flutter   . Tachycardia-bradycardia syndrome     leadless pacemaker ([name]) implanted by Dr Johney Frame  . Atrial fibrillation     "just developed this winter 2013" (08/11/2012)  . CHF (congestive heart failure)   . OSA on CPAP   . Arthritis     "touch in my fingers & right hip" (11/12/2012)  . Combined systolic and diastolic heart failure, NYHA class 3    Past Surgical History  Procedure Laterality Date  . Cholecystectomy  1980's  . Salivary gland surgery  2000's    "had a little lump removed; granulosa related; it was benign" (08/11/2012)  . US echocardiography  11/21/2003    EF 55-60%  . Cardiovascular stress test  03/23/2009    EF 72%  . Abdominal hysterectomy    . Colon surgery  2004    colectomy for diverticulosis  . Granulosa tumor excision  2000; 2003; 2004;  2007    "all in my abdomen including small intestines, outside my ?uterus/etc" (08/11/2012)  . Diagnostic laparoscopy      gallbladder removal; abdominal hernia repair  . Tee without cardioversion  12/31/2011    Procedure: TRANSESOPHAGEAL ECHOCARDIOGRAM (TEE);  Surgeon: Corky Crafts, MD;  Location: [facility] ENDOSCOPY;  Service: Cardiovascular;  Laterality: N/A;  . Cardioversion  12/31/2011    Procedure: CARDIOVERSION;  Surgeon: Corky Crafts, MD;  Location: [facility] ENDOSCOPY;  Service: Cardiovascular;  Laterality: N/A;  . Pacemaker insertion  2/11/4    [name] (SJM) leadless pacemaker (LEADLESS II STUDY PATEINT)  . Cardiac catheterization  09/03/2007    EF 70%; Failed attempt at PCI to OM  . Cardiac catheterization  11/01/2003    EF 70%  . Coronary angioplasty    . Coronary angioplasty with stent placement  2000; 08/11/2012; 11/12/2012    "3 + 2 +1; total of 6" (11/12/2012)  . Insert / replace / remove pacemaker  03/16/2012    [name] (SJM) leadless pacemaker (LEADLESS II STUDY PATEINT)  . Varicose vein surgery Bilateral 1977  . Hernia repair  2005    "laparoscopic"    Current Outpatient Prescriptions  Medication Sig Dispense Refill  . atorvastatin (LIPITOR) 80 MG tablet Take 1 tablet (80 mg total) by mouth every morning.  90 tablet  2  . Calcium Carb-Cholecalciferol (CALTRATE 600+D) 600-800 MG-UNIT TABS Take 1 tablet by mouth every morning.       . clopidogrel (PLAVIX) 75 MG tablet Take 1 tablet (75 mg total) by mouth daily with breakfast.  30 tablet  11  . Coenzyme Q-10 100 MG capsule Take 100 mg by mouth daily.       Marland Kitchen dextromethorphan-guaiFENesin (ROBITUSSIN-DM) 10-100 MG/5ML liquid Take 5 mLs by mouth every 4 (four) hours as needed for cough.      . diltiazem (CARDIZEM CD) 240 MG 24 hr capsule Take 1 capsule (240 mg total) by mouth daily.  30 capsule  11  . diphenhydrAMINE (BENADRYL) 25 MG tablet Take 25 mg by mouth every 6 (six) hours as needed for itching (and rash).      Marland Kitchen  docusate sodium (COLACE) 100 MG capsule Take 100 mg by mouth daily as needed for constipation.      . furosemide (LASIX) 80 MG tablet Take 80 mg by mouth daily. Take an additional 40mg  qpm PRN dyspnea, edema      . hydrocortisone cream 1 % Apply 1 application topically daily as needed (for rash).      . isosorbide mononitrate (IMDUR) 60 MG 24 hr tablet Take 0.5-1 tablets (30-60 mg total) by mouth 2 (two) times daily. Take 1 tablet in the morning and 1/2 tablet in the afternoon  135 tablet  1  . lisinopril (PRINIVIL,ZESTRIL) 10 MG tablet Take 0.5 tablets (5 mg total) by mouth 2 (two) times daily.  90 tablet  2  . metoprolol tartrate (LOPRESSOR) 25 MG tablet Take 1/2 tablet once a day      . Multiple Vitamin (MULTIVITAMIN WITH MINERALS) TABS tablet Take 1 tablet by mouth every morning.      . nitroGLYCERIN (NITROSTAT) 0.4 MG SL tablet Place 1 tablet (0.4 mg total) under the tongue every 5 (five) minutes as needed. For chest pain  35 tablet  11  . Omega-3 Fatty Acids (FISH OIL PO) Take 1,200 mg by mouth daily.       . potassium chloride SA (K-DUR,KLOR-CON) 20 MEQ tablet Take 20 mEq by mouth every morning. Take 2 tabs qam      . vitamin E 400 UNIT capsule Take 400 Units by mouth daily as needed (for vitamin).       Marland Kitchen warfarin (COUMADIN) 2 MG tablet Take as directed by the coumadin clinic       No current facility-administered medications for this visit.    Physical Exam: Filed Vitals:   01/12/13 1458  BP: 138/70  Pulse: 66  Height: 5' (1.524 m)  Weight: 169 lb 12.8 oz (77.021 kg)    GEN- The patient is well appearing, alert and oriented x 3 today.   Head- normocephalic, atraumatic Eyes-  Sclera clear, conjunctiva pink Ears- hearing intact Oropharynx- clear Lungs- Clear to ausculation bilaterally, normal work of breathing Heart- Regular rate and rhythm, no murmurs, rubs or gallops, PMI not laterally displaced GI- soft, NT, ND, + BS Extremities- no clubbing, cyanosis, or edema  Pacemaker  interrogation- reviewed in detail today,  See PACEART report  ekg today reveals sinus bradycardia with IVCD, demand V pacing  Assessment and Plan:  1. Tachycardia/ Bradycardia Normal pacemaker function See Arita Miss Art report No changes today She is V pacing 22% and clinically doing very well No changes today  2. afib Continue rate control and anticoagulation long term  Return to see me in February

## 2013-01-12 NOTE — Patient Instructions (Signed)
Your physician wants you to follow-up in: Feb 2015 with Dr Johney Frame Bonita Quin will receive a reminder letter in the mail two months in advance. If you don't receive a letter, please call our office to schedule the follow-up appointment.

## 2013-01-13 ENCOUNTER — Other Ambulatory Visit: Payer: Self-pay | Admitting: *Deleted

## 2013-01-13 MED ORDER — DILTIAZEM HCL ER COATED BEADS 240 MG PO CP24: 240.0000 mg | ORAL_CAPSULE | Freq: Every day | ORAL | Status: DC

## 2013-01-14 ENCOUNTER — Encounter (HOSPITAL_COMMUNITY): Payer: Medicare Other

## 2013-02-03 HISTORY — PX: CATARACT EXTRACTION, BILATERAL: SHX1313

## 2013-02-04 ENCOUNTER — Ambulatory Visit (INDEPENDENT_AMBULATORY_CARE_PROVIDER_SITE_OTHER): Payer: Medicare Other | Admitting: Pharmacist

## 2013-02-04 DIAGNOSIS — I4891 Unspecified atrial fibrillation: Secondary | ICD-10-CM | POA: Diagnosis not present

## 2013-02-04 DIAGNOSIS — D485 Neoplasm of uncertain behavior of skin: Secondary | ICD-10-CM | POA: Diagnosis not present

## 2013-02-04 DIAGNOSIS — D235 Other benign neoplasm of skin of trunk: Secondary | ICD-10-CM | POA: Diagnosis not present

## 2013-02-04 LAB — POCT INR: INR: 2

## 2013-02-08 ENCOUNTER — Encounter: Payer: Self-pay | Admitting: Interventional Cardiology

## 2013-02-10 ENCOUNTER — Encounter: Payer: Self-pay | Admitting: Interventional Cardiology

## 2013-02-22 ENCOUNTER — Telehealth: Payer: Self-pay | Admitting: Interventional Cardiology

## 2013-02-22 NOTE — Telephone Encounter (Deleted)
To Dr. Varanasi, please advise.  

## 2013-02-22 NOTE — Telephone Encounter (Signed)
New message  Patient is having episodes of chest pain. One on Sunday and one again today. She is wanting to know if her med's should be changed or if she has another blockage, please call and advise.

## 2013-02-22 NOTE — Telephone Encounter (Addendum)
Spoke with pt and she is having intermittent CP every few days. On Sunday the chest pain only last a few minutes and once she sat down and rested it went away. Today her episode was very mild and after 1 SL Nitro her pain was gone in a few seconds. This also happened once last week. Pt is currently feeling okay right now and she is going out to a program. Pt is taking Imdur 30 mg 1 tab in the am and 1/2 tab in the evening. Pt is taking Metoprolol 25 mg BID. Pt is aware that if symptoms become severe she shoud call 911 and go directly to the ED. Pt has a dentist appt on 03-09-13 where she will have her bridge cut and on 03-10-13 she will a tooth removed. Is it okay for pt to have this done? Her surgeon thinks that she should stay on her coumadin and she will let Ysidro Evert know about this on 03/03/13 at her next appt with him.

## 2013-03-03 ENCOUNTER — Ambulatory Visit (INDEPENDENT_AMBULATORY_CARE_PROVIDER_SITE_OTHER): Payer: Medicare Other | Admitting: Pharmacist

## 2013-03-03 DIAGNOSIS — R69 Illness, unspecified: Secondary | ICD-10-CM | POA: Diagnosis not present

## 2013-03-03 DIAGNOSIS — I4891 Unspecified atrial fibrillation: Secondary | ICD-10-CM

## 2013-03-03 LAB — POCT INR: INR: 1.8

## 2013-03-17 ENCOUNTER — Encounter: Payer: Self-pay | Admitting: Internal Medicine

## 2013-03-17 ENCOUNTER — Ambulatory Visit (INDEPENDENT_AMBULATORY_CARE_PROVIDER_SITE_OTHER): Payer: Medicare Other | Admitting: Internal Medicine

## 2013-03-17 VITALS — BP 136/84 | HR 81 | Ht 60.0 in | Wt 166.0 lb

## 2013-03-17 DIAGNOSIS — I4891 Unspecified atrial fibrillation: Secondary | ICD-10-CM | POA: Diagnosis not present

## 2013-03-17 MED ORDER — METOPROLOL TARTRATE 25 MG PO TABS: 25.0000 mg | ORAL_TABLET | Freq: Two times a day (BID) | ORAL | Status: DC

## 2013-03-17 MED ORDER — METOPROLOL TARTRATE 25 MG PO TABS: ORAL_TABLET | ORAL | Status: DC

## 2013-03-17 NOTE — Patient Instructions (Addendum)
Your physician wants you to follow-up in: 6 months with Dr. Allred. You will receive a reminder letter in the mail two months in advance. If you don't receive a letter, please call our office to schedule the follow-up appointment.  

## 2013-03-17 NOTE — Progress Notes (Signed)
PCP: Horton Finer, MD Primary Cardiologist:  Dr Armandina Stammer E Winberry is a 80 y.o. female who presents today for electrophysiology followup.  She is doing well at this time.  No complaints.  Feels "great".  Today, she denies symptoms of palpitations, exertional chest pain, shortness of breath,  lower extremity edema, dizziness, presyncope, or syncope.  The patient is otherwise without complaint today.   Past Medical History  Diagnosis Date  . IHD (ischemic heart disease)     Remote MI in 2000 with stent to LAD and LCX. Failed  intervention to OM in 2009. Managed medically since that time.  . Hypertension   . Hyperlipidemia   . Obesity   . Granulosa cell tumor     Has had several surgeries due to her tumor  . Diverticulitis   . Shortness of breath   . Peripheral vascular disease   . Other and unspecified angina pectoris   . MI (myocardial infarction) 06/1998    "when they were putting in the stent" (08/11/2012)  . Varicose veins   . Granulosa cell carcinoma     abd; "last episode was in 2009" (08/11/2012)  . Dysrhythmia   . Atrial flutter   . Tachycardia-bradycardia syndrome     leadless pacemaker ([name]) implanted by Dr [name]  . Atrial fibrillation     "just developed this winter 2013" (08/11/2012)  . CHF (congestive heart failure)   . OSA on CPAP   . Arthritis     "touch in my fingers & right hip" (11/12/2012)  . Combined systolic and diastolic heart failure, NYHA class 3    Past Surgical History  Procedure Laterality Date  . Cholecystectomy  1980's  . Salivary gland surgery  2000's    "had a little lump removed; granulosa related; it was benign" (08/11/2012)  . US echocardiography  11/21/2003    EF 55-60%  . Cardiovascular stress test  03/23/2009    EF 72%  . Abdominal hysterectomy    . Colon surgery  2004    colectomy for diverticulosis  . Granulosa tumor excision  2000; 2003; 2004; 2007    "all in my abdomen including small intestines, outside my  ?uterus/etc" (08/11/2012)  . Diagnostic laparoscopy      gallbladder removal; abdominal hernia repair  . Tee without cardioversion  12/31/2011    Procedure: TRANSESOPHAGEAL ECHOCARDIOGRAM (TEE);  Surgeon: [name], MD;  Location: [facility];  Service: Cardiovascular;  Laterality: N/A;  . Cardioversion  12/31/2011    Procedure: CARDIOVERSION;  Surgeon: [name], MD;  Location: [facility];  Service: Cardiovascular;  Laterality: N/A;  . Pacemaker insertion  2/11/4    [name] (SJM) leadless pacemaker (LEADLESS II STUDY PATEINT)  . Cardiac catheterization  09/03/2007    EF 70%; Failed attempt at PCI to OM  . Cardiac catheterization  11/01/2003    EF 70%  . Coronary angioplasty    . Coronary angioplasty with stent placement  2000; 08/11/2012; 11/12/2012    "3 + 2 +1; total of 6" (11/12/2012)  . Insert / replace / remove pacemaker  03/16/2012    [name] (SJM) leadless pacemaker (LEADLESS II STUDY PATEINT)  . Varicose vein surgery Bilateral 1977  . Hernia repair  2005    "laparoscopic"    Current Outpatient Prescriptions  Medication Sig Dispense Refill  . atorvastatin (LIPITOR) 80 MG tablet Take 1 tablet (80 mg total) by mouth every morning.  90 tablet  2  . Calcium Carb-Cholecalciferol (CALTRATE 600+D) 600-800 MG-UNIT TABS  Take 1 tablet by mouth every morning.       . clopidogrel (PLAVIX) 75 MG tablet Take 1 tablet (75 mg total) by mouth daily with breakfast.  30 tablet  11  . Coenzyme Q-10 100 MG capsule Take 100 mg by mouth daily.       Marland Kitchen dextromethorphan-guaiFENesin (ROBITUSSIN-DM) 10-100 MG/5ML liquid Take 5 mLs by mouth every 4 (four) hours as needed for cough.      . diltiazem (CARDIZEM CD) 240 MG 24 hr capsule Take 1 capsule (240 mg total) by mouth daily.  30 capsule  0  . diphenhydrAMINE (BENADRYL) 25 MG tablet Take 25 mg by mouth every 6 (six) hours as needed for itching (and rash).      Marland Kitchen docusate sodium (COLACE) 100 MG capsule Take 100 mg by mouth daily as  needed for constipation.      . furosemide (LASIX) 80 MG tablet Take 80 mg by mouth daily. Take an additional 40mg  qpm PRN dyspnea, edema      . hydrocortisone cream 1 % Apply 1 application topically daily as needed (for rash).      . isosorbide mononitrate (IMDUR) 60 MG 24 hr tablet Take 1 tablet in the morning and 1/2 tablet in the afternoon      . lisinopril (PRINIVIL,ZESTRIL) 10 MG tablet Take 0.5 tablets (5 mg total) by mouth 2 (two) times daily.  90 tablet  2  . metoprolol tartrate (LOPRESSOR) 25 MG tablet Take 1/2 tablet once a day      . Multiple Vitamin (MULTIVITAMIN WITH MINERALS) TABS tablet Take 1 tablet by mouth every morning.      . nitroGLYCERIN (NITROSTAT) 0.4 MG SL tablet Place 1 tablet (0.4 mg total) under the tongue every 5 (five) minutes as needed. For chest pain  35 tablet  11  . Omega-3 Fatty Acids (FISH OIL PO) Take 1,200 mg by mouth daily.       . potassium chloride SA (K-DUR,KLOR-CON) 20 MEQ tablet Take 20 mEq by mouth every morning. Take 2 tabs qa      . vitamin E 400 UNIT capsule Take 400 Units by mouth daily.       Marland Kitchen warfarin (COUMADIN) 2 MG tablet Take as directed by the coumadin clinic       No current facility-administered medications for this visit.    Physical Exam: Filed Vitals:   03/17/13 1537  BP: 136/84  Pulse: 81  Height: 5' (1.524 m)  Weight: 166 lb (75.297 kg)    GEN- The patient is well appearing, alert and oriented x 3 today.   Head- normocephalic, atraumatic Eyes-  Sclera clear, conjunctiva pink Ears- hearing intact Oropharynx- clear Lungs- Clear to ausculation bilaterally, normal work of breathing Heart- irregular rate and rhythm, no murmurs, rubs or gallops, PMI not laterally displaced GI- soft, NT, ND, + BS Extremities- no clubbing, cyanosis, or edema  Pacemaker interrogation- reviewed in detail today,  See PACEART report  ekg today reveals afib, V rate 81, IVCD  Assessment and Plan:  1. Tachycardia/ Bradycardia Normal pacemaker  function See Pace Art report No changes today She is V pacing 10% and clinically doing very well No changes today  2. afib Continue rate control and anticoagulation long term chads2vasc score is at least 5  Return to see me in 6 months

## 2013-03-24 ENCOUNTER — Ambulatory Visit (INDEPENDENT_AMBULATORY_CARE_PROVIDER_SITE_OTHER): Payer: Medicare Other | Admitting: Pharmacist

## 2013-03-24 DIAGNOSIS — I4891 Unspecified atrial fibrillation: Secondary | ICD-10-CM

## 2013-03-24 LAB — POCT INR: INR: 2

## 2013-03-25 LAB — MDC_IDC_ENUM_SESS_TYPE_INCLINIC
Brady Statistic RV Percent Paced: 10 %
Implantable Pulse Generator Serial Number: 1096
Lead Channel Sensing Intrinsic Amplitude: 5.5 mV
Lead Channel Setting Pacing Pulse Width: 0.4 ms
Lead Channel Setting Sensing Sensitivity: 2 mV
MDC IDC MSMT LEADCHNL RV PACING THRESHOLD AMPLITUDE: 0.75 V
MDC IDC MSMT LEADCHNL RV PACING THRESHOLD PULSEWIDTH: 0.4 ms
MDC IDC SET LEADCHNL RV PACING AMPLITUDE: 2.5 V

## 2013-03-28 ENCOUNTER — Other Ambulatory Visit: Payer: Self-pay | Admitting: *Deleted

## 2013-03-28 ENCOUNTER — Telehealth: Payer: Self-pay | Admitting: *Deleted

## 2013-03-28 MED ORDER — FUROSEMIDE 40 MG PO TABS: 40.0000 mg | ORAL_TABLET | Freq: Every day | ORAL | Status: DC

## 2013-03-28 MED ORDER — FUROSEMIDE 80 MG PO TABS: 80.0000 mg | ORAL_TABLET | Freq: Every day | ORAL | Status: DC

## 2013-03-28 NOTE — Telephone Encounter (Signed)
error 

## 2013-04-01 ENCOUNTER — Other Ambulatory Visit: Payer: Self-pay | Admitting: Interventional Cardiology

## 2013-04-01 ENCOUNTER — Telehealth: Payer: Self-pay | Admitting: Interventional Cardiology

## 2013-04-01 MED ORDER — FUROSEMIDE 40 MG PO TABS: ORAL_TABLET | ORAL | Status: DC

## 2013-04-01 NOTE — Telephone Encounter (Signed)
Appt made, lasix refilled. Pt aware.

## 2013-04-01 NOTE — Telephone Encounter (Signed)
Lmtrc, pt is due for 3 month follow up appt

## 2013-04-01 NOTE — Telephone Encounter (Signed)
New Message  Pt called to discuss dosage changes for Medication, Furosemide-- She states that her original script is for 80 mg tablets taking 1 tablet in the am and 1/2 tablet in the PM. Pt has recieved 30 tablets. Pt states that she doesn't have enough to last her a full 30 days.. [facility] has suggested 40 mg tablets.. Pt is requesting a call back to discuss further. Please call

## 2013-04-05 ENCOUNTER — Telehealth: Payer: Self-pay | Admitting: Interventional Cardiology

## 2013-04-05 MED ORDER — ATORVASTATIN CALCIUM 80 MG PO TABS: 80.0000 mg | ORAL_TABLET | Freq: Every morning | ORAL | Status: DC

## 2013-04-05 NOTE — Telephone Encounter (Signed)
New message    Out of atorvastatin 80mg  tablet-----[facility]  Want 30da supply.  Pt will call [facility].

## 2013-04-05 NOTE — Telephone Encounter (Signed)
REFILLED

## 2013-04-13 ENCOUNTER — Ambulatory Visit (INDEPENDENT_AMBULATORY_CARE_PROVIDER_SITE_OTHER): Payer: Medicare Other | Admitting: Pharmacist

## 2013-04-13 DIAGNOSIS — I4891 Unspecified atrial fibrillation: Secondary | ICD-10-CM | POA: Diagnosis not present

## 2013-04-13 LAB — POCT INR: INR: 1.8

## 2013-04-14 DIAGNOSIS — H353190 Nonexudative age-related macular degeneration, unspecified eye, stage unspecified: Secondary | ICD-10-CM | POA: Diagnosis not present

## 2013-04-14 DIAGNOSIS — H2510 Age-related nuclear cataract, unspecified eye: Secondary | ICD-10-CM | POA: Diagnosis not present

## 2013-04-14 DIAGNOSIS — H02839 Dermatochalasis of unspecified eye, unspecified eyelid: Secondary | ICD-10-CM | POA: Diagnosis not present

## 2013-04-14 DIAGNOSIS — H25019 Cortical age-related cataract, unspecified eye: Secondary | ICD-10-CM | POA: Diagnosis not present

## 2013-04-14 DIAGNOSIS — H5200 Hypermetropia, unspecified eye: Secondary | ICD-10-CM | POA: Diagnosis not present

## 2013-04-19 ENCOUNTER — Telehealth: Payer: Self-pay | Admitting: Oncology

## 2013-04-19 ENCOUNTER — Other Ambulatory Visit: Payer: Self-pay

## 2013-04-19 DIAGNOSIS — Z1231 Encounter for screening mammogram for malignant neoplasm of breast: Secondary | ICD-10-CM

## 2013-04-19 NOTE — Telephone Encounter (Signed)
pt called for 58month f/u w/LL. pt given appt for lb/fu 4/24 @ 1:30pm. pt last seen by DC oct 2014. pt wished to scheduled 85month f/u w/LL

## 2013-04-21 ENCOUNTER — Telehealth: Payer: Self-pay | Admitting: Interventional Cardiology

## 2013-04-21 NOTE — Telephone Encounter (Signed)
New message    Patient spoke with Patty [name] this am .    A decision was made Patient will not be going to hospital.

## 2013-04-21 NOTE — Telephone Encounter (Signed)
Spoke with patient's husband. Pt woke up at 6AM today feeling fine. She ate breakfast as usual. About 20-30 minutes ago she developed pain in the middle of her chest associated with vomiting, SOB and fast HR. Pt continues to have active chest pain associated with SOB and fast HR. I advised pt to take SLNTG now. I reviewed with Dr Irish Lack and he recommended pt report to ED for further evaluation. I advised pt's husband to call 911 for transport to ED. Trish notified

## 2013-04-21 NOTE — Telephone Encounter (Signed)
New message    Pt is having chest pain and vomiting

## 2013-04-21 NOTE — Telephone Encounter (Signed)
Spoke with patient. Pt called EMS and was evaluated by EMS earlier today. The paramedics and pt made decision not to be transported to ED for further evaluation. Paramedic suggested to pt that she avoid orange juice/wine and other acidic beverages. Pt did go on to [employer] afterwards and did her usual exercise without problems. Pt states she is fine now. I will forward to Dr Irish Lack for review.

## 2013-04-25 ENCOUNTER — Telehealth: Payer: Self-pay | Admitting: Interventional Cardiology

## 2013-04-25 NOTE — Telephone Encounter (Signed)
Pt notified that she can have surgery.

## 2013-04-25 NOTE — Telephone Encounter (Signed)
New message    Patient calling - schedule to have eye surgery on tomorrow.    C/O burning in the center of chest.    No burning in chest at this moment . Took am medication.

## 2013-04-25 NOTE — Telephone Encounter (Signed)
Called stating she has had the chest tightness and burning in chest every morning since last Thursday.  Spoke w/Anne Lankford,RN on Thurs who advised her to call 911.  She did and by the time they got there at 9:30 her pain was gone.  She did take a NTG.  The EMS stayed there for 30 min and evaluated her but she elected not to go to ER.  After they left she went to her [place] work out and didn't have any more pain.  Pain and burning reoccurred Fri, Sat, Sun.  On Sat morning she took some baking soda w/water and that helped immediately.  States the discomfort is usually gone by mid morning and usually does not reoccur.  When is having the discomfort BP does go up and HR goes up to 120-130 but then comes back down in 30 min or less.  She is scheduled for a cataract procedure tomorrow morning and wants to know if she should be OK to proceed.  She has not seen her PCP-Dr. Maxwell Caul nor has she taken any antacids.  States she has never been diagnosed to GERD.  She states she has tried to eliminate caffeine and OJ but that hasn't seen to make any difference.  Advised that Dr. Irish Lack is at the hospital today and will forward message to him.

## 2013-04-25 NOTE — Telephone Encounter (Signed)
Ok for cataract surgery 

## 2013-04-25 NOTE — Telephone Encounter (Signed)
Note sent to Dr. Herbert Deaner.

## 2013-04-26 DIAGNOSIS — H269 Unspecified cataract: Secondary | ICD-10-CM | POA: Diagnosis not present

## 2013-04-26 DIAGNOSIS — H2510 Age-related nuclear cataract, unspecified eye: Secondary | ICD-10-CM | POA: Diagnosis not present

## 2013-04-29 ENCOUNTER — Ambulatory Visit (INDEPENDENT_AMBULATORY_CARE_PROVIDER_SITE_OTHER): Payer: Medicare Other

## 2013-04-29 DIAGNOSIS — I4891 Unspecified atrial fibrillation: Secondary | ICD-10-CM

## 2013-04-29 LAB — POCT INR: INR: 1.9

## 2013-05-02 ENCOUNTER — Other Ambulatory Visit: Payer: Self-pay | Admitting: Interventional Cardiology

## 2013-05-11 ENCOUNTER — Ambulatory Visit: Payer: Medicare Other | Admitting: Interventional Cardiology

## 2013-05-13 ENCOUNTER — Ambulatory Visit (INDEPENDENT_AMBULATORY_CARE_PROVIDER_SITE_OTHER): Payer: Medicare Other | Admitting: Pharmacist

## 2013-05-13 DIAGNOSIS — I4891 Unspecified atrial fibrillation: Secondary | ICD-10-CM

## 2013-05-13 LAB — POCT INR: INR: 3.5

## 2013-05-19 ENCOUNTER — Ambulatory Visit: Payer: Medicare Other | Admitting: Interventional Cardiology

## 2013-05-23 ENCOUNTER — Telehealth: Payer: Self-pay | Admitting: Interventional Cardiology

## 2013-05-23 NOTE — Telephone Encounter (Signed)
New Message  Pt called requests a call back to discuss a prescription that is high in vitamin E// No further details//Request a call back to discuss.

## 2013-05-23 NOTE — Telephone Encounter (Signed)
LM ON SECURE VM

## 2013-05-23 NOTE — Telephone Encounter (Signed)
Pt wants to make sure she can take eye vitamins (Preservision) for macular degeneration, which are high in vitamin E. Pt was already taking MVI (has 37% daily of vitamin E) and OTC Vitamin E 400 units.

## 2013-05-23 NOTE — Telephone Encounter (Signed)
This will be okay to take along with her MVI.  I don't think she needs to continue her other Vitamin E supplement though.

## 2013-05-24 ENCOUNTER — Telehealth: Payer: Self-pay | Admitting: Oncology

## 2013-05-24 NOTE — Telephone Encounter (Signed)
returned pt call and r/s appt per pt....pt aware of new d.t

## 2013-05-25 ENCOUNTER — Other Ambulatory Visit: Payer: Self-pay

## 2013-05-25 MED ORDER — LISINOPRIL 10 MG PO TABS: 5.0000 mg | ORAL_TABLET | Freq: Two times a day (BID) | ORAL | Status: DC

## 2013-05-26 ENCOUNTER — Ambulatory Visit (INDEPENDENT_AMBULATORY_CARE_PROVIDER_SITE_OTHER): Payer: Medicare Other | Admitting: Pharmacist

## 2013-05-26 DIAGNOSIS — I4891 Unspecified atrial fibrillation: Secondary | ICD-10-CM | POA: Diagnosis not present

## 2013-05-26 LAB — POCT INR: INR: 2.8

## 2013-05-27 ENCOUNTER — Ambulatory Visit: Payer: Medicare Other | Admitting: Oncology

## 2013-05-27 ENCOUNTER — Other Ambulatory Visit: Payer: Medicare Other

## 2013-05-31 ENCOUNTER — Other Ambulatory Visit: Payer: Self-pay

## 2013-05-31 ENCOUNTER — Other Ambulatory Visit: Payer: Self-pay | Admitting: *Deleted

## 2013-05-31 MED ORDER — DILTIAZEM HCL ER COATED BEADS 240 MG PO CP24: ORAL_CAPSULE | ORAL | Status: DC

## 2013-05-31 MED ORDER — POTASSIUM CHLORIDE CRYS ER 20 MEQ PO TBCR: 20.0000 meq | EXTENDED_RELEASE_TABLET | Freq: Every day | ORAL | Status: DC

## 2013-05-31 MED ORDER — POTASSIUM CHLORIDE CRYS ER 20 MEQ PO TBCR: EXTENDED_RELEASE_TABLET | ORAL | Status: DC

## 2013-05-31 NOTE — Telephone Encounter (Signed)
Should this be one or two daily? Thanks, MI

## 2013-06-01 ENCOUNTER — Ambulatory Visit: Payer: Medicare Other

## 2013-06-01 DIAGNOSIS — I252 Old myocardial infarction: Secondary | ICD-10-CM | POA: Diagnosis not present

## 2013-06-01 DIAGNOSIS — Z23 Encounter for immunization: Secondary | ICD-10-CM | POA: Diagnosis not present

## 2013-06-01 DIAGNOSIS — I4891 Unspecified atrial fibrillation: Secondary | ICD-10-CM | POA: Diagnosis not present

## 2013-06-01 DIAGNOSIS — Z1331 Encounter for screening for depression: Secondary | ICD-10-CM | POA: Diagnosis not present

## 2013-06-01 DIAGNOSIS — G4733 Obstructive sleep apnea (adult) (pediatric): Secondary | ICD-10-CM | POA: Diagnosis not present

## 2013-06-01 DIAGNOSIS — I2510 Atherosclerotic heart disease of native coronary artery without angina pectoris: Secondary | ICD-10-CM | POA: Diagnosis not present

## 2013-06-01 DIAGNOSIS — I10 Essential (primary) hypertension: Secondary | ICD-10-CM | POA: Diagnosis not present

## 2013-06-01 DIAGNOSIS — Z0000 Encounter for general adult medical examination without abnormal findings: Secondary | ICD-10-CM | POA: Diagnosis not present

## 2013-06-02 ENCOUNTER — Other Ambulatory Visit: Payer: Self-pay

## 2013-06-02 DIAGNOSIS — H25019 Cortical age-related cataract, unspecified eye: Secondary | ICD-10-CM | POA: Diagnosis not present

## 2013-06-02 DIAGNOSIS — I4891 Unspecified atrial fibrillation: Secondary | ICD-10-CM

## 2013-06-02 DIAGNOSIS — H2510 Age-related nuclear cataract, unspecified eye: Secondary | ICD-10-CM | POA: Diagnosis not present

## 2013-06-02 MED ORDER — ISOSORBIDE MONONITRATE ER 60 MG PO TB24
ORAL_TABLET | ORAL | Status: DC
Start: 2013-06-02 — End: 2013-10-04

## 2013-06-05 ENCOUNTER — Other Ambulatory Visit: Payer: Self-pay | Admitting: Oncology

## 2013-06-08 ENCOUNTER — Encounter: Payer: Self-pay | Admitting: Oncology

## 2013-06-08 ENCOUNTER — Ambulatory Visit (HOSPITAL_BASED_OUTPATIENT_CLINIC_OR_DEPARTMENT_OTHER): Payer: Medicare Other | Admitting: Oncology

## 2013-06-08 ENCOUNTER — Other Ambulatory Visit (HOSPITAL_BASED_OUTPATIENT_CLINIC_OR_DEPARTMENT_OTHER): Payer: Medicare Other

## 2013-06-08 VITALS — BP 136/70 | HR 62 | Temp 98.3°F | Resp 17 | Ht 60.0 in | Wt 166.7 lb

## 2013-06-08 DIAGNOSIS — Z8543 Personal history of malignant neoplasm of ovary: Secondary | ICD-10-CM | POA: Diagnosis not present

## 2013-06-08 DIAGNOSIS — D3910 Neoplasm of uncertain behavior of unspecified ovary: Secondary | ICD-10-CM

## 2013-06-08 DIAGNOSIS — C569 Malignant neoplasm of unspecified ovary: Secondary | ICD-10-CM

## 2013-06-08 LAB — COMPREHENSIVE METABOLIC PANEL (CC13)
ALT: 32 U/L (ref 0–55)
ANION GAP: 8 meq/L (ref 3–11)
AST: 34 U/L (ref 5–34)
Albumin: 3.4 g/dL — ABNORMAL LOW (ref 3.5–5.0)
Alkaline Phosphatase: 116 U/L (ref 40–150)
BILIRUBIN TOTAL: 0.7 mg/dL (ref 0.20–1.20)
BUN: 22.6 mg/dL (ref 7.0–26.0)
CALCIUM: 9.2 mg/dL (ref 8.4–10.4)
CHLORIDE: 106 meq/L (ref 98–109)
CO2: 32 mEq/L — ABNORMAL HIGH (ref 22–29)
CREATININE: 0.9 mg/dL (ref 0.6–1.1)
Glucose: 118 mg/dl (ref 70–140)
Potassium: 3.7 mEq/L (ref 3.5–5.1)
Sodium: 146 mEq/L — ABNORMAL HIGH (ref 136–145)
Total Protein: 6.6 g/dL (ref 6.4–8.3)

## 2013-06-08 LAB — CBC WITH DIFFERENTIAL/PLATELET
BASO%: 0.4 % (ref 0.0–2.0)
BASOS ABS: 0 10*3/uL (ref 0.0–0.1)
EOS%: 4 % (ref 0.0–7.0)
Eosinophils Absolute: 0.2 10*3/uL (ref 0.0–0.5)
HEMATOCRIT: 39.2 % (ref 34.8–46.6)
HEMOGLOBIN: 13 g/dL (ref 11.6–15.9)
LYMPH#: 1.3 10*3/uL (ref 0.9–3.3)
LYMPH%: 20.5 % (ref 14.0–49.7)
MCH: 30.5 pg (ref 25.1–34.0)
MCHC: 33.1 g/dL (ref 31.5–36.0)
MCV: 91.9 fL (ref 79.5–101.0)
MONO#: 0.7 10*3/uL (ref 0.1–0.9)
MONO%: 10.4 % (ref 0.0–14.0)
NEUT#: 4.1 10*3/uL (ref 1.5–6.5)
NEUT%: 64.7 % (ref 38.4–76.8)
PLATELETS: 199 10*3/uL (ref 145–400)
RBC: 4.26 10*6/uL (ref 3.70–5.45)
RDW: 14.6 % — ABNORMAL HIGH (ref 11.2–14.5)
WBC: 6.3 10*3/uL (ref 3.9–10.3)

## 2013-06-08 NOTE — Progress Notes (Signed)
OFFICE PROGRESS NOTE   06/08/2013   Physicians:J.Osborne, J.Varanasi, J.Allred, B.Hoxworth   INTERVAL HISTORY:  Patient is seen, alone for visit, in continuing follow up of history of granulosa cell tumor of ovary, which has had several indolent recurrences in pelvis from 2007 thru 2011, treated surgically. Last CT AP was 11-2011. She has had no recent symptoms suggesting progressive disease. Patty [name] has had a complicated course in past year with cardiac disease, including stent in 11-2012, atrial fibrillation and diastolic heart failure; she has pacer. She is followed by Dr Irish Lack since Dr Susa Simmonds retirement, and by Dr [name] for the pacer.   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   ONCOLOGIC HISTORY  Review of systems as above, also: Exertional angina intermittently which resolves with rest, has used total 2 NTG in last 2 weeks. CPAP by Dr Maxwell Caul for almost a year which has been very helpful. Exercising several days weekly with [employer] and at [employer]. No recent infectious illness. No SOB at rest. Bowels moving regularly, bladder unchanged, no abdominal or pelvic pain. Good appetite. Remainder of 10 point Review of Systems negative.  Objective:  Vital signs in last 24 hours:  BP 136/70  Pulse 62  Temp(Src) 98.3 F (36.8 C) (Oral)  Resp 17  Ht 5' (1.524 m)  Wt 166 lb 11.2 oz (75.615 kg)  BMI 32.56 kg/m2  SpO2 96% Weight is down ~ 6 lbs. Alert, oriented and appropriate. Ambulatory without difficulty.   HEENT:PERRL, sclerae not icteric. Oral mucosa moist without lesions, posterior pharynx clear.  Neck supple. No JVD.  Lymphatics:no cervical,suraclavicular, axillary or inguinal adenopathy Resp: clear to auscultation bilaterally and normal percussion bilaterally Cardio: regular rate and rhythm. No gallop. QP:RFFMBWG obese, protuberant, full but soft and nontender, no appreciable mass or  organomegaly. Normally active bowel sounds. Surgical incisions not remarkable. Musculoskeletal/ Extremities: without pitting edema, cords, tenderness Neuro: CN, motor, sensory, cerebellar nonfocal. PSYCH normal mood and affect Skin without rash, ecchymosis, petechiae   Lab Results:  Results for orders placed in visit on 06/08/13  CBC WITH DIFFERENTIAL      Result Value Ref Range   WBC 6.3  3.9 - 10.3 10e3/uL   NEUT# 4.1  1.5 - 6.5 10e3/uL   HGB 13.0  11.6 - 15.9 g/dL   HCT 39.2  34.8 - 46.6 %   Platelets 199  145 - 400 10e3/uL   MCV 91.9  79.5 - 101.0 fL   MCH 30.5  25.1 - 34.0 pg   MCHC 33.1  31.5 - 36.0 g/dL   RBC 4.26  3.70 - 5.45 10e6/uL   RDW 14.6 (*) 11.2 - 14.5 %   lymph# 1.3  0.9 - 3.3 10e3/uL   MONO# 0.7  0.1 - 0.9 10e3/uL   Eosinophils Absolute 0.2  0.0 - 0.5 10e3/uL   Basophils Absolute 0.0  0.0 - 0.1 10e3/uL   NEUT% 64.7  38.4 - 76.8 %   LYMPH% 20.5  14.0 - 49.7 %   MONO% 10.4  0.0 - 14.0 %   EOS% 4.0  0.0 - 7.0 %   BASO% 0.4  0.0 - 2.0 %  COMPREHENSIVE METABOLIC PANEL (YK59)      Result Value Ref Range   Sodium 146 (*) 136 - 145 mEq/L   Potassium 3.7  3.5 - 5.1 mEq/L   Chloride 106  98 - 109 mEq/L   CO2 32 (*) 22 - 29 mEq/L  Glucose 118  70 - 140 mg/dl   BUN 22.6  7.0 - 26.0 mg/dL   Creatinine 0.9  0.6 - 1.1 mg/dL   Total Bilirubin 0.70  0.20 - 1.20 mg/dL   Alkaline Phosphatase 116  40 - 150 U/L   AST 34  5 - 34 U/L   ALT 32  0 - 55 U/L   Total Protein 6.6  6.4 - 8.3 g/dL   Albumin 3.4 (*) 3.5 - 5.0 g/dL   Calcium 9.2  8.4 - 10.4 mg/dL   Anion Gap 8  3 - 11 mEq/L     Studies/Results: COMPLETE ABDOMINAL ULTRASOUND 06-2012 Comparison: Renal ultrasound 06/08/2010.  Findings:  Gallbladder: Status post cholecystectomy.  Common bile duct: Normal in caliber for the patient's age and post  cholecystectomy status measuring 6.8 mm in the porta hepatis.  Liver: In the right lobe of the liver there is a 12 x 12 x 12 mm  lesion and is heterogeneously  hyperechoic, but otherwise  nonspecific in appearance. No other focal lesions are noted.  There is generalized increased echogenicity throughout the hepatic  parenchyma, which could suggest hepatic steatosis. No intrahepatic  biliary ductal dilatation. Normal hepatopetal flow in the portal  vein.  IVC: Appears normal.  Pancreas: No focal abnormality seen.  Spleen: Normal size and echotexture without focal parenchymal  abnormality. 5.7 cm in length.  Right Kidney: No hydronephrosis. Well-preserved cortex. Normal  size and parenchymal echotexture without focal abnormalities. 10.3  cm in length.  Left Kidney: No hydronephrosis. Well-preserved cortex. Normal  size and parenchymal echotexture without focal abnormalities. 9.9  cm in length.  Abdominal aorta: Atherosclerotic aorta, but normal in caliber  measuring 2.2 cm in diameter proximally and tapering appropriately  distally.  IMPRESSION:  1. No acute findings in the abdomen.  2. However, there does appear to be hepatic steatosis.  3. Small 1.2 cm lesion in the right lobe of the liver is  indeterminate by ultrasound imaging, but is no larger than prior CT  examinations dating back to at least 06/09/2008, and is therefore  presumably benign.  4. Status post cholecystectomy.  5. Atherosclerosis.   CHEST 2 VIEW  12-01-2012 COMPARISON: 03/17/2012  FINDINGS:  Chronic cardiopericardial enlargement. An implantable device is in  stable position. There are Kerley B-lines newly noted. There are  lower lung opacities which appear linear in the lateral projection.  Coronary artery stents present.  IMPRESSION:  1. Mild pulmonary edema.  2. Lower lung atelectasis.     Medications: I have reviewed the patient's current medications.  DISCUSSION: as long as patient is asymptomatic, she and I are in agreement not to do routine scans following the granulosa cell tumor. I am glad to see her back at any time prior to scheduled appointment if  concerns in interim.  Assessment/Plan: 1.granulosa cell tumor of ovary: recurrences locally since 2004 treated surgically, very indolent. Has had no systemic treatment. Would repeat scans if any concerns, but no routine scans now. 2.cardiac disease including atrial fibrillation and tachy brady syndrome, CAD, stent, hx MI: followed closely by [facility] 3.sleep apnea on CPAP 4. Cardiac pacemaker   Patient is comfortable with discussion and plan.      Gordy Levan, MD   06/08/2013, 3:03 PM

## 2013-06-13 ENCOUNTER — Ambulatory Visit
Admission: RE | Admit: 2013-06-13 | Discharge: 2013-06-13 | Disposition: A | Discharge status: Home / Self Care | Payer: Medicare Other | Source: Ambulatory Visit

## 2013-06-13 DIAGNOSIS — Z1231 Encounter for screening mammogram for malignant neoplasm of breast: Secondary | ICD-10-CM

## 2013-06-13 DIAGNOSIS — Z1211 Encounter for screening for malignant neoplasm of colon: Secondary | ICD-10-CM | POA: Diagnosis not present

## 2013-06-21 ENCOUNTER — Other Ambulatory Visit: Payer: Self-pay | Admitting: *Deleted

## 2013-06-21 MED ORDER — WARFARIN SODIUM 2 MG PO TABS: ORAL_TABLET | ORAL | Status: DC

## 2013-06-23 ENCOUNTER — Telehealth: Payer: Self-pay | Admitting: Interventional Cardiology

## 2013-06-23 NOTE — Telephone Encounter (Signed)
To Triage.

## 2013-06-23 NOTE — Telephone Encounter (Signed)
New message    patient calling  With blood pressure issues After she woke up from a nap . Taken her blood pressure  X 3    175/116 186/112 2 min ago 192/117  Hear rate  110.

## 2013-06-23 NOTE — Telephone Encounter (Signed)
Reviewed with Dr. [name] and pt's rate and blood pressure should improve after she takes medications. I spoke with pt and gave her this information. She will let us know if heart rate and blood pressure continue to be elevated later today. She reports she is currently feeling better and having no pain at this time.

## 2013-06-23 NOTE — Telephone Encounter (Signed)
Spoke with pt who reports episode of chest tightness this AM around 8:30. Took 1 NTG with relief. At 9 AM she got up out of chair and had another episode of chest pain which was relieved with 1 NTG. She states she does have these episodes of chest pain at times. She took a nap from 10-11 today. Checked blood pressure after nap and these are listed below.  Heart rate at that time ranged 110-118. She reports she checks blood pressure and heart rate daily and usual readings are 110-120/60's and 60-75. Heart rate will go to 90-100 with exercise and then return to normal range. She has had dry cough, sore throat, headache and chest congestion for 1 week. She spoke with Dr. Maxwell Caul regarding this and was instructed to take Mucinex DM which she has been taking until last night. She has since rechecked her vitals since initial call and readings are 176/111 and heart rate 109.  She has not taken any of her medications this AM due to chest pain earlier and concern she may not be able to keep them down. She is having no nausea at this time and will take AM medications. She reports she still has some chest tightness at this time.  Will review with MD.

## 2013-06-24 ENCOUNTER — Telehealth: Payer: Self-pay | Admitting: *Deleted

## 2013-06-24 MED ORDER — WARFARIN SODIUM 2 MG PO TABS: ORAL_TABLET | ORAL | Status: DC

## 2013-06-24 NOTE — Telephone Encounter (Signed)
Telephoned pt and left for out of town without enough pills, thus [facility] so that pt can call from [facility] and have it transferred.

## 2013-06-24 NOTE — Telephone Encounter (Signed)
[facility] but not filled as she is traveling to [place] and will get [facility] when she arrives. Thanks, MI

## 2013-06-29 ENCOUNTER — Encounter: Payer: Self-pay | Admitting: Interventional Cardiology

## 2013-06-29 ENCOUNTER — Ambulatory Visit (INDEPENDENT_AMBULATORY_CARE_PROVIDER_SITE_OTHER): Payer: Medicare Other | Admitting: Interventional Cardiology

## 2013-06-29 ENCOUNTER — Ambulatory Visit (INDEPENDENT_AMBULATORY_CARE_PROVIDER_SITE_OTHER): Payer: Medicare Other | Admitting: *Deleted

## 2013-06-29 VITALS — BP 150/80 | HR 92 | Ht 60.0 in | Wt 167.8 lb

## 2013-06-29 DIAGNOSIS — I2510 Atherosclerotic heart disease of native coronary artery without angina pectoris: Secondary | ICD-10-CM

## 2013-06-29 DIAGNOSIS — I4891 Unspecified atrial fibrillation: Secondary | ICD-10-CM

## 2013-06-29 DIAGNOSIS — E785 Hyperlipidemia, unspecified: Secondary | ICD-10-CM | POA: Diagnosis not present

## 2013-06-29 DIAGNOSIS — I10 Essential (primary) hypertension: Secondary | ICD-10-CM | POA: Diagnosis not present

## 2013-06-29 LAB — POCT INR: INR: 2.4

## 2013-06-29 NOTE — Progress Notes (Signed)
Patient ID: Patty [name], female   DOB: 31-Aug-1930, 80 y.o.   MRN: [id number]    [place], [address], [place]  [address] Phone: [phone] Fax:  [phone]  Date:  06/29/2013   ID:  Patty [name], DOB 10-01-1930, MRN [id number]  PCP:  Horton Finer, MD      History of Present Illness: Patty [name] is a 80 y.o. female with AFib and CAD. She had multivessel rotational atherectomy and stent placement in 7/14. She finished rehab without any problems. CAD/ASCVD:   Denies :   Diaphoresis.  Diet.  Dizziness.  Dyspnea on exertion.  Exercise.  Fatigue.  Leg edema.  Orthopnea.  Palpitations.   She does have chest discomfort when she bends down. She has some chest discomfort in the morning when she wakes up. She'll take a nitroglycerin and it may go away. This occurs randomly, about twice a month. There is no chest discomfort related to exertion. She continues to exercise regularly. There is no problem in the afternoon when she is at rest. It is only in the morning, before breakfast.  Wt Readings from Last 3 Encounters:  06/29/13 167 lb 12.8 oz (76.114 kg)  06/08/13 166 lb 11.2 oz (75.615 kg)  03/17/13 166 lb (75.297 kg)     Past Medical History  Diagnosis Date  . IHD (ischemic heart disease)     Remote MI in 2000 with stent to LAD and LCX. Failed  intervention to OM in 2009. Managed medically since that time.  . Hypertension   . Hyperlipidemia   . Obesity   . Granulosa cell tumor     Has had several surgeries due to her tumor  . Diverticulitis   . Shortness of breath   . Peripheral vascular disease   . Other and unspecified angina pectoris   . MI (myocardial infarction) 06/1998    "when they were putting in the stent" (08/11/2012)  . Varicose veins   . Granulosa cell carcinoma     abd; "last episode was in 2009" (08/11/2012)  . Dysrhythmia   . Atrial flutter   . Tachycardia-bradycardia syndrome     leadless pacemaker ([name]) implanted by Dr  [name]  . Atrial fibrillation     "just developed this winter 2013" (08/11/2012)  . CHF (congestive heart failure)   . OSA on CPAP   . Arthritis     "touch in my fingers & right hip" (11/12/2012)  . Combined systolic and diastolic heart failure, NYHA class 3     Current Outpatient Prescriptions  Medication Sig Dispense Refill  . atorvastatin (LIPITOR) 80 MG tablet Take 1 tablet (80 mg total) by mouth every morning.  30 tablet  2  . Calcium Carb-Cholecalciferol (CALTRATE 600+D) 600-800 MG-UNIT TABS Take 1 tablet by mouth every morning.       . clopidogrel (PLAVIX) 75 MG tablet Take 1 tablet (75 mg total) by mouth daily with breakfast.  30 tablet  11  . Coenzyme Q-10 100 MG capsule Take 100 mg by mouth daily.       Marland Kitchen dextromethorphan-guaiFENesin (ROBITUSSIN-DM) 10-100 MG/5ML liquid Take 5 mLs by mouth every 4 (four) hours as needed for cough.      . diltiazem (CARDIZEM CD) 240 MG 24 hr capsule TAKE ONE CAPSULE BY MOUTH EVERY DAY  30 capsule  6  . diphenhydrAMINE (BENADRYL) 25 MG tablet Take 25 mg by mouth every 6 (six) hours as needed for itching (and rash).      Marland Kitchen  docusate sodium (COLACE) 100 MG capsule Take 100 mg by mouth daily as needed for constipation.      . furosemide (LASIX) 40 MG tablet 2 tablets in the am and an extra tablet in the pm as needed  90 tablet  4  . isosorbide mononitrate (IMDUR) 60 MG 24 hr tablet 135Take 1 tablet in the morning and 1/2 tablet in the afternoon  135 tablet  0  . lisinopril (PRINIVIL,ZESTRIL) 10 MG tablet Take 0.5 tablets (5 mg total) by mouth 2 (two) times daily.  90 tablet  2  . metoprolol tartrate (LOPRESSOR) 25 MG tablet One tablet twice daily  180 tablet  3  . Multiple Vitamin (MULTIVITAMIN WITH MINERALS) TABS tablet Take 1 tablet by mouth every morning.      . Multiple Vitamins-Minerals (OCUVITE PRESERVISION) TABS Take 1 tablet by mouth 2 (two) times daily.      . nitroGLYCERIN (NITROSTAT) 0.4 MG SL tablet Place 1 tablet (0.4 mg total) under the  tongue every 5 (five) minutes as needed. For chest pain  35 tablet  11  . Omega-3 Fatty Acids (FISH OIL PO) Take 1,200 mg by mouth daily.       . potassium chloride SA (K-DUR,KLOR-CON) 20 MEQ tablet Take 3 tabs daily      . warfarin (COUMADIN) 2 MG tablet 1 tablet on all days except 1/2 tablet on Tuesday or  as directed by the coumadin clinic  15 tablet  0   No current facility-administered medications for this visit.    Allergies:    Allergies  Allergen Reactions  . Bee Venom Anaphylaxis  . Adhesive [Tape]   . Amoxicillin Diarrhea and Nausea And Vomiting  . Other     Pain medications cause severe vomiting.    Social History:  The patient  reports that she quit smoking about 39 years ago. Her smoking use included Cigarettes. She has a 32 pack-year smoking history. She has never used smokeless tobacco. She reports that she drinks about 3 ounces of alcohol per week. She reports that she does not use illicit drugs.   Family History:  The patient's family history includes Diabetes in her brother and father; Heart attack in her brother and father; Hypertension in her brother and father; Kidney failure in her brother; Stroke in her mother.   ROS:  Please see the history of present illness.  No nausea, vomiting.  No fevers, chills.  No focal weakness.  No dysuria. Atypical chest discomfort as noted above.   All other systems reviewed and negative.   PHYSICAL EXAM: VS:  BP 150/80  Pulse 92  Ht 5' (1.524 m)  Wt 167 lb 12.8 oz (76.114 kg)  BMI 32.77 kg/m2 Well nourished, well developed, in no acute distress HEENT: normal Neck: no JVD, no carotid bruits Cardiac:  normal S1, S2; irregularly irregular, normal rate Lungs:  clear to auscultation bilaterally, no wheezing, rhonchi or rales Abd: soft, nontender, no hepatomegaly Ext: no edema Skin: warm and dry Neuro:   no focal abnormalities noted      ASSESSMENT AND PLAN:  CAD (coronary artery disease)  Continue Plavix Tablet, 75 MG, 1  tablet, Orally, Once a day Continue Atorvastatin Calcium Tablet, 80 MG, 1 tablet, Orally, Once a day Notes: No angina. s/p DES to the LAD and circumflex. No aspirin due to plavix and coumadin. OK for cardiac rehab.  LDL 54 in 4/15. Continue statin.   2. Essential hypertension, benign  Notes: Controlled.     3.  Atrial fibrillation  Continue Cardizem CD Capsule Extended Release 24 Hour, 240 MG, 1 capsule, Orally, Once a day - patient stopped 07/12/12 on her own but we restarted it. Notes: Rate controlled.   4. Cardiomyopathy  Notes: Will need to recheck echo in a few months to see if LVEF has improved. EF 40% in 10/14.   5. Ankle edema  Notes: Left leg edema. Doubt DVT given that she is on coumadin. Continue to monitor.    Preventive Medicine  Adult topics discussed:  Diet: healthy diet, low calorie, low fat.  Exercise: 5 days a week, at least 30 minutes of aerobic exercise.      Signed, Mina Marble, MD, [facility] 06/29/2013 4:30 PM

## 2013-06-29 NOTE — Patient Instructions (Signed)
Your physician recommends that you continue on your current medications as directed. Please refer to the Current Medication list given to you today.  Your physician wants you to follow-up in: 6 months with Dr. Varanasi.  You will receive a reminder letter in the mail two months in advance. If you don't receive a letter, please call our office to schedule the follow-up appointment.  

## 2013-07-01 ENCOUNTER — Other Ambulatory Visit: Payer: Self-pay | Admitting: *Deleted

## 2013-07-01 MED ORDER — ATORVASTATIN CALCIUM 80 MG PO TABS: 80.0000 mg | ORAL_TABLET | Freq: Every morning | ORAL | Status: DC

## 2013-07-06 ENCOUNTER — Other Ambulatory Visit: Payer: Self-pay | Admitting: Interventional Cardiology

## 2013-07-12 DIAGNOSIS — H269 Unspecified cataract: Secondary | ICD-10-CM | POA: Diagnosis not present

## 2013-07-12 DIAGNOSIS — H2510 Age-related nuclear cataract, unspecified eye: Secondary | ICD-10-CM | POA: Diagnosis not present

## 2013-07-25 ENCOUNTER — Encounter: Payer: Self-pay | Admitting: Cardiology

## 2013-07-27 ENCOUNTER — Ambulatory Visit (INDEPENDENT_AMBULATORY_CARE_PROVIDER_SITE_OTHER): Payer: Medicare Other | Admitting: Pharmacist

## 2013-07-27 DIAGNOSIS — I4891 Unspecified atrial fibrillation: Secondary | ICD-10-CM | POA: Diagnosis not present

## 2013-07-27 LAB — POCT INR: INR: 2.8

## 2013-08-23 DIAGNOSIS — H43819 Vitreous degeneration, unspecified eye: Secondary | ICD-10-CM | POA: Diagnosis not present

## 2013-09-02 ENCOUNTER — Other Ambulatory Visit: Payer: Self-pay

## 2013-09-02 MED ORDER — CLOPIDOGREL BISULFATE 75 MG PO TABS: 75.0000 mg | ORAL_TABLET | Freq: Every day | ORAL | Status: DC

## 2013-09-09 ENCOUNTER — Ambulatory Visit (INDEPENDENT_AMBULATORY_CARE_PROVIDER_SITE_OTHER): Payer: Medicare Other | Admitting: *Deleted

## 2013-09-09 ENCOUNTER — Telehealth: Payer: Self-pay

## 2013-09-09 DIAGNOSIS — I4891 Unspecified atrial fibrillation: Secondary | ICD-10-CM | POA: Diagnosis not present

## 2013-09-09 LAB — POCT INR: INR: 2.3

## 2013-09-09 MED ORDER — WARFARIN SODIUM 2 MG PO TABS
ORAL_TABLET | ORAL | Status: DC
Start: 2013-09-09 — End: 2013-10-04

## 2013-09-09 NOTE — Telephone Encounter (Signed)
Rx refilled.

## 2013-09-23 ENCOUNTER — Other Ambulatory Visit: Payer: Self-pay | Admitting: *Deleted

## 2013-09-23 MED ORDER — POTASSIUM CHLORIDE CRYS ER 20 MEQ PO TBCR: 60.0000 meq | EXTENDED_RELEASE_TABLET | Freq: Every day | ORAL | Status: DC

## 2013-09-28 ENCOUNTER — Ambulatory Visit
Admission: RE | Admit: 2013-09-28 | Discharge: 2013-09-28 | Disposition: A | Discharge status: Home / Self Care | Payer: Medicare Other | Source: Ambulatory Visit | Attending: Internal Medicine | Admitting: Internal Medicine

## 2013-09-28 ENCOUNTER — Other Ambulatory Visit: Payer: Self-pay | Admitting: Internal Medicine

## 2013-09-28 DIAGNOSIS — M25439 Effusion, unspecified wrist: Secondary | ICD-10-CM

## 2013-09-28 DIAGNOSIS — M19039 Primary osteoarthritis, unspecified wrist: Secondary | ICD-10-CM | POA: Diagnosis not present

## 2013-09-29 DIAGNOSIS — M25539 Pain in unspecified wrist: Secondary | ICD-10-CM | POA: Diagnosis not present

## 2013-09-29 DIAGNOSIS — M25439 Effusion, unspecified wrist: Secondary | ICD-10-CM | POA: Diagnosis not present

## 2013-09-30 ENCOUNTER — Telehealth: Payer: Self-pay | Admitting: Interventional Cardiology

## 2013-09-30 NOTE — Telephone Encounter (Signed)
New message    For Patty [name] Pt is on warfarin.  Her orthopedic doctor put her on prednesone.  She has already taken 4 pills.  Can she take prednesone and warfarin together?

## 2013-09-30 NOTE — Telephone Encounter (Signed)
Started prednisone yesterday.  Was given 12 day taper of 10mg  tablets for issues with her wrist (? Arthritis).  She had issues with insomnia related to prednisone and does not want to take anymore.  Since she only took one day's dose, will not adjust coumadin dose.  She is aware to call us if she starts a new medication or decides to take the prednisone.

## 2013-10-03 ENCOUNTER — Other Ambulatory Visit: Payer: Self-pay | Admitting: Interventional Cardiology

## 2013-10-04 ENCOUNTER — Emergency Department (HOSPITAL_COMMUNITY): Payer: Medicare Other

## 2013-10-04 ENCOUNTER — Other Ambulatory Visit: Payer: Self-pay | Admitting: *Deleted

## 2013-10-04 ENCOUNTER — Inpatient Hospital Stay (HOSPITAL_COMMUNITY)
Admission: EM | Admit: 2013-10-04 | Discharge: 2013-10-08 | DRG: 247 | Disposition: A | Discharge status: Home / Self Care | Payer: Medicare Other | Attending: Interventional Cardiology | Admitting: Interventional Cardiology

## 2013-10-04 ENCOUNTER — Telehealth: Payer: Self-pay | Admitting: Interventional Cardiology

## 2013-10-04 ENCOUNTER — Encounter (HOSPITAL_COMMUNITY): Payer: Self-pay | Admitting: Emergency Medicine

## 2013-10-04 DIAGNOSIS — G4733 Obstructive sleep apnea (adult) (pediatric): Secondary | ICD-10-CM | POA: Diagnosis present

## 2013-10-04 DIAGNOSIS — I5042 Chronic combined systolic (congestive) and diastolic (congestive) heart failure: Secondary | ICD-10-CM | POA: Diagnosis present

## 2013-10-04 DIAGNOSIS — Z9861 Coronary angioplasty status: Secondary | ICD-10-CM

## 2013-10-04 DIAGNOSIS — I10 Essential (primary) hypertension: Secondary | ICD-10-CM | POA: Diagnosis present

## 2013-10-04 DIAGNOSIS — E669 Obesity, unspecified: Secondary | ICD-10-CM | POA: Diagnosis present

## 2013-10-04 DIAGNOSIS — R079 Chest pain, unspecified: Secondary | ICD-10-CM | POA: Diagnosis not present

## 2013-10-04 DIAGNOSIS — Z881 Allergy status to other antibiotic agents status: Secondary | ICD-10-CM

## 2013-10-04 DIAGNOSIS — I517 Cardiomegaly: Secondary | ICD-10-CM | POA: Diagnosis not present

## 2013-10-04 DIAGNOSIS — I200 Unstable angina: Secondary | ICD-10-CM | POA: Diagnosis present

## 2013-10-04 DIAGNOSIS — Z7902 Long term (current) use of antithrombotics/antiplatelets: Secondary | ICD-10-CM

## 2013-10-04 DIAGNOSIS — I739 Peripheral vascular disease, unspecified: Secondary | ICD-10-CM | POA: Diagnosis present

## 2013-10-04 DIAGNOSIS — Z8249 Family history of ischemic heart disease and other diseases of the circulatory system: Secondary | ICD-10-CM | POA: Diagnosis not present

## 2013-10-04 DIAGNOSIS — I252 Old myocardial infarction: Secondary | ICD-10-CM | POA: Diagnosis not present

## 2013-10-04 DIAGNOSIS — Z833 Family history of diabetes mellitus: Secondary | ICD-10-CM | POA: Diagnosis not present

## 2013-10-04 DIAGNOSIS — Z79899 Other long term (current) drug therapy: Secondary | ICD-10-CM

## 2013-10-04 DIAGNOSIS — Z841 Family history of disorders of kidney and ureter: Secondary | ICD-10-CM | POA: Diagnosis not present

## 2013-10-04 DIAGNOSIS — Y831 Surgical operation with implant of artificial internal device as the cause of abnormal reaction of the patient, or of later complication, without mention of misadventure at the time of the procedure: Secondary | ICD-10-CM | POA: Diagnosis present

## 2013-10-04 DIAGNOSIS — I25110 Atherosclerotic heart disease of native coronary artery with unstable angina pectoris: Secondary | ICD-10-CM

## 2013-10-04 DIAGNOSIS — Z823 Family history of stroke: Secondary | ICD-10-CM | POA: Diagnosis not present

## 2013-10-04 DIAGNOSIS — E785 Hyperlipidemia, unspecified: Secondary | ICD-10-CM

## 2013-10-04 DIAGNOSIS — T82897A Other specified complication of cardiac prosthetic devices, implants and grafts, initial encounter: Secondary | ICD-10-CM | POA: Diagnosis present

## 2013-10-04 DIAGNOSIS — I2589 Other forms of chronic ischemic heart disease: Secondary | ICD-10-CM | POA: Diagnosis present

## 2013-10-04 DIAGNOSIS — Z87891 Personal history of nicotine dependence: Secondary | ICD-10-CM | POA: Diagnosis not present

## 2013-10-04 DIAGNOSIS — Z950 Presence of cardiac pacemaker: Secondary | ICD-10-CM | POA: Diagnosis not present

## 2013-10-04 DIAGNOSIS — Z7982 Long term (current) use of aspirin: Secondary | ICD-10-CM | POA: Diagnosis not present

## 2013-10-04 DIAGNOSIS — Z888 Allergy status to other drugs, medicaments and biological substances status: Secondary | ICD-10-CM

## 2013-10-04 DIAGNOSIS — I4891 Unspecified atrial fibrillation: Secondary | ICD-10-CM

## 2013-10-04 DIAGNOSIS — I482 Chronic atrial fibrillation, unspecified: Secondary | ICD-10-CM

## 2013-10-04 DIAGNOSIS — E876 Hypokalemia: Secondary | ICD-10-CM | POA: Diagnosis not present

## 2013-10-04 DIAGNOSIS — R0789 Other chest pain: Secondary | ICD-10-CM | POA: Diagnosis not present

## 2013-10-04 DIAGNOSIS — I2510 Atherosclerotic heart disease of native coronary artery without angina pectoris: Secondary | ICD-10-CM | POA: Diagnosis present

## 2013-10-04 DIAGNOSIS — I509 Heart failure, unspecified: Secondary | ICD-10-CM | POA: Diagnosis present

## 2013-10-04 DIAGNOSIS — Z91038 Other insect allergy status: Secondary | ICD-10-CM | POA: Diagnosis not present

## 2013-10-04 DIAGNOSIS — I25119 Atherosclerotic heart disease of native coronary artery with unspecified angina pectoris: Secondary | ICD-10-CM

## 2013-10-04 DIAGNOSIS — Z7901 Long term (current) use of anticoagulants: Secondary | ICD-10-CM

## 2013-10-04 DIAGNOSIS — I5040 Unspecified combined systolic (congestive) and diastolic (congestive) heart failure: Secondary | ICD-10-CM | POA: Diagnosis present

## 2013-10-04 HISTORY — DX: Left bundle-branch block, unspecified: I44.7

## 2013-10-04 HISTORY — DX: Atherosclerotic heart disease of native coronary artery without angina pectoris: I25.10

## 2013-10-04 LAB — CBC WITH DIFFERENTIAL/PLATELET
Basophils Absolute: 0 10*3/uL (ref 0.0–0.1)
Basophils Relative: 0 % (ref 0–1)
EOS PCT: 2 % (ref 0–5)
Eosinophils Absolute: 0.1 10*3/uL (ref 0.0–0.7)
HEMATOCRIT: 38.7 % (ref 36.0–46.0)
Hemoglobin: 13.2 g/dL (ref 12.0–15.0)
LYMPHS ABS: 1.6 10*3/uL (ref 0.7–4.0)
Lymphocytes Relative: 24 % (ref 12–46)
MCH: 31.3 pg (ref 26.0–34.0)
MCHC: 34.1 g/dL (ref 30.0–36.0)
MCV: 91.7 fL (ref 78.0–100.0)
Monocytes Absolute: 0.6 10*3/uL (ref 0.1–1.0)
Monocytes Relative: 9 % (ref 3–12)
NEUTROS ABS: 4.3 10*3/uL (ref 1.7–7.7)
Neutrophils Relative %: 65 % (ref 43–77)
Platelets: 210 10*3/uL (ref 150–400)
RBC: 4.22 MIL/uL (ref 3.87–5.11)
RDW: 14.3 % (ref 11.5–15.5)
WBC: 6.6 10*3/uL (ref 4.0–10.5)

## 2013-10-04 LAB — BASIC METABOLIC PANEL
ANION GAP: 10 (ref 5–15)
BUN: 22 mg/dL (ref 6–23)
CO2: 28 meq/L (ref 19–32)
Calcium: 8.5 mg/dL (ref 8.4–10.5)
Chloride: 106 mEq/L (ref 96–112)
Creatinine, Ser: 0.88 mg/dL (ref 0.50–1.10)
GFR calc Af Amer: 70 mL/min — ABNORMAL LOW (ref >90)
GFR calc non Af Amer: 60 mL/min — ABNORMAL LOW (ref >90)
GLUCOSE: 98 mg/dL (ref 70–99)
POTASSIUM: 3.9 meq/L (ref 3.7–5.3)
SODIUM: 144 meq/L (ref 137–147)

## 2013-10-04 LAB — I-STAT TROPONIN, ED: TROPONIN I, POC: 0.03 ng/mL (ref 0.00–0.08)

## 2013-10-04 LAB — PROTIME-INR
INR: 2.86 — ABNORMAL HIGH (ref 0.00–1.49)
PROTHROMBIN TIME: 30 s — AB (ref 11.6–15.2)

## 2013-10-04 LAB — TROPONIN I: Troponin I: <0.3 ng/mL (ref <0.30)

## 2013-10-04 LAB — PRO B NATRIURETIC PEPTIDE: PRO B NATRI PEPTIDE: 1963 pg/mL — AB (ref 0–450)

## 2013-10-04 LAB — APTT: aPTT: 37 seconds (ref 24–37)

## 2013-10-04 MED ORDER — ACETAMINOPHEN 325 MG PO TABS: 650.0000 mg | ORAL_TABLET | ORAL | Status: DC | PRN

## 2013-10-04 MED ORDER — ASPIRIN EC 81 MG PO TBEC
81.0000 mg | DELAYED_RELEASE_TABLET | Freq: Every day | ORAL | Status: DC
  Administered 2013-10-05 – 2013-10-08 (×3): 81 mg via ORAL
  Filled 2013-10-04 (×6): qty 1

## 2013-10-04 MED ORDER — ISOSORBIDE MONONITRATE ER 60 MG PO TB24
60.0000 mg | ORAL_TABLET | Freq: Every day | ORAL | Status: DC
  Filled 2013-10-04: qty 1

## 2013-10-04 MED ORDER — NITROGLYCERIN 0.4 MG SL SUBL
0.4000 mg | SUBLINGUAL_TABLET | SUBLINGUAL | Status: DC | PRN
  Administered 2013-10-05 (×2): 0.4 mg via SUBLINGUAL
  Filled 2013-10-04 (×3): qty 1

## 2013-10-04 MED ORDER — METOPROLOL TARTRATE 25 MG PO TABS
25.0000 mg | ORAL_TABLET | Freq: Two times a day (BID) | ORAL | Status: DC
  Administered 2013-10-05: 25 mg via ORAL
  Filled 2013-10-04 (×3): qty 1

## 2013-10-04 MED ORDER — ONDANSETRON HCL 4 MG/2ML IJ SOLN: 4.0000 mg | Freq: Four times a day (QID) | INTRAMUSCULAR | Status: DC | PRN

## 2013-10-04 MED ORDER — DILTIAZEM HCL ER COATED BEADS 240 MG PO CP24
240.0000 mg | ORAL_CAPSULE | Freq: Every day | ORAL | Status: DC
  Administered 2013-10-05 – 2013-10-08 (×4): 240 mg via ORAL
  Filled 2013-10-04 (×4): qty 1

## 2013-10-04 MED ORDER — ISOSORBIDE MONONITRATE ER 30 MG PO TB24
30.0000 mg | ORAL_TABLET | Freq: Every day | ORAL | Status: DC
  Administered 2013-10-04: 30 mg via ORAL
  Filled 2013-10-04 (×2): qty 1

## 2013-10-04 MED ORDER — ATORVASTATIN CALCIUM 80 MG PO TABS
80.0000 mg | ORAL_TABLET | Freq: Every day | ORAL | Status: DC
  Administered 2013-10-05 – 2013-10-07 (×3): 80 mg via ORAL
  Filled 2013-10-04 (×4): qty 1

## 2013-10-04 MED ORDER — ASPIRIN EC 81 MG PO TBEC: 81.0000 mg | DELAYED_RELEASE_TABLET | Freq: Every day | ORAL | Status: DC

## 2013-10-04 MED ORDER — OMEGA-3-ACID ETHYL ESTERS 1 G PO CAPS
2.0000 g | ORAL_CAPSULE | Freq: Every day | ORAL | Status: DC
  Administered 2013-10-04 – 2013-10-08 (×5): 2 g via ORAL
  Filled 2013-10-04 (×5): qty 2

## 2013-10-04 MED ORDER — LISINOPRIL 5 MG PO TABS
5.0000 mg | ORAL_TABLET | Freq: Two times a day (BID) | ORAL | Status: DC
  Administered 2013-10-04 – 2013-10-08 (×8): 5 mg via ORAL
  Filled 2013-10-04 (×9): qty 1

## 2013-10-04 MED ORDER — CLOPIDOGREL BISULFATE 75 MG PO TABS
75.0000 mg | ORAL_TABLET | Freq: Every day | ORAL | Status: DC
  Administered 2013-10-05 – 2013-10-07 (×3): 75 mg via ORAL
  Filled 2013-10-04 (×4): qty 1

## 2013-10-04 MED ORDER — ISOSORBIDE MONONITRATE ER 60 MG PO TB24: 30.0000 mg | ORAL_TABLET | Freq: Two times a day (BID) | ORAL | Status: DC

## 2013-10-04 NOTE — H&P (Signed)
Cardiologist:  Varanasi/Allred Stanton Kidney E [place] is an 80 y.o. female.   Chief Complaint: Chest Pain HPI:  The patient is an 80 yo female with a history of CAD, tachy/brady with PPM([employer]), afib on coumadin, OSA-CPAP, PVD, obesity, HTN, HLD, Sys/dias CHF.   Her last echo was 12/2011 and ef was 60-65%.  She had a left heart cath on 11/12/12 which showed normal left main coronary artery. Patent stents in the left anterior descending artery.  Patent stents in the proximal to mid left circumflex artery. 80% proximal lesion outside of the stented area. This was the culprit for her symptoms and this was successfully treated with a 3.0 x 8 promise drug-eluting stent postdilated to 3.5 mm in diameter. Moderate small vessel disease in the circumflex branches.  Mild to moderate diffuse disease in the right coronary artery .  Mildly decreased left ventricular systolic function. Improved LV function compared to the July ventriculogram. LVEDP 21 mmHg. Ejection fraction 40 %.  The patient reports she first developed chest "pressure" in the middle of her chest last Sunday.  SHe took a nitroglycerine which relieved the pain.  Since then she has had increasing frequency of chest pressure and today she took three NTG SL, with relief each time and reoccurance after just 30 minutes.  She reports feeling "clammy" each time.  The patient currently denies nausea, vomiting, fever, shortness of breath, orthopnea, dizziness, PND, cough, congestion, abdominal pain, hematochezia, melena, lower extremity edema.      Medications: Prior to Admission medications   Medication Sig Start Date End Date Taking? Authorizing Provider  acetaminophen (TYLENOL) 325 MG tablet Take 325 mg by mouth every 6 (six) hours as needed (pain).   Yes Historical Provider, MD  atorvastatin (LIPITOR) 80 MG tablet Take 1 tablet (80 mg total) by mouth every morning. 07/01/13  Yes [name], MD  Calcium Carb-Cholecalciferol (CALTRATE 600+D) 600-800  MG-UNIT TABS Take 1 tablet by mouth every morning.    Yes Historical Provider, MD  clopidogrel (PLAVIX) 75 MG tablet Take 1 tablet (75 mg total) by mouth daily with breakfast. 09/02/13  Yes [name], MD  Coenzyme Q-10 100 MG capsule Take 100 mg by mouth daily.    Yes Historical Provider, MD  diltiazem (CARDIZEM CD) 240 MG 24 hr capsule Take 240 mg by mouth daily.   Yes Historical Provider, MD  furosemide (LASIX) 40 MG tablet Take 40-80 mg by mouth 2 (two) times daily. Take 54m every morning and 456mevery evening   Yes Historical Provider, MD  isosorbide mononitrate (IMDUR) 60 MG 24 hr tablet Take 30-60 mg by mouth 2 (two) times daily. 6037mvery morning and 10m59mery evening   Yes Historical Provider, MD  lisinopril (PRINIVIL,ZESTRIL) 10 MG tablet Take 0.5 tablets (5 mg total) by mouth 2 (two) times daily. 05/25/13  Yes JayaJettie Booze  metoprolol tartrate (LOPRESSOR) 25 MG tablet Take 25 mg by mouth 2 (two) times daily.   Yes Historical Provider, MD  Multiple Vitamin (MULTIVITAMIN WITH MINERALS) TABS tablet Take 1 tablet by mouth every morning.   Yes Historical Provider, MD  Multiple Vitamins-Minerals (OCUVITE PRESERVISION) TABS Take 1 tablet by mouth daily.    Yes Historical Provider, MD  nitroGLYCERIN (NITROSTAT) 0.4 MG SL tablet Place 0.4 mg under the tongue every 5 (five) minutes as needed for chest pain.   Yes Historical Provider, MD  Omega-3 Fatty Acids (FISH OIL PO) Take 1,200 mg by mouth daily.    Yes Historical  Provider, MD  potassium chloride SA (K-DUR,KLOR-CON) 20 MEQ tablet Take 3 tablets (60 mEq total) by mouth daily. 09/23/13  Yes [name], MD  warfarin (COUMADIN) 2 MG tablet Take 1-2 mg by mouth daily. 26m on Tuesday and 233mdaily all other days   Yes Historical Provider, MD    Past Medical History  Diagnosis Date  . IHD (ischemic heart disease)     Remote MI in 2000 with stent to LAD and LCX. Failed  intervention to OM in 2009. Managed medically since  that time.  . Hypertension   . Hyperlipidemia   . Obesity   . Granulosa cell tumor     Has had several surgeries due to her tumor  . Diverticulitis   . Shortness of breath   . Peripheral vascular disease   . Other and unspecified angina pectoris   . MI (myocardial infarction) 06/1998    "when they were putting in the stent" (08/11/2012)  . Varicose veins   . Granulosa cell carcinoma     abd; "last episode was in 2009" (08/11/2012)  . Dysrhythmia   . Atrial flutter   . Tachycardia-bradycardia syndrome     leadless pacemaker ([name]) implanted by Dr AlRayann Heman. Atrial fibrillation     "just developed this winter 2013" (08/11/2012)  . CHF (congestive heart failure)   . OSA on CPAP   . Arthritis     "touch in my fingers & right hip" (11/12/2012)  . Combined systolic and diastolic heart failure, NYHA class 3     Past Surgical History  Procedure Laterality Date  . Cholecystectomy  1980's  . Salivary gland surgery  2000's    "had a little lump removed; granulosa related; it was benign" (08/11/2012)  . UsKoreachocardiography  11/21/2003    EF 55-60%  . Cardiovascular stress test  03/23/2009    EF 72%  . Abdominal hysterectomy    . Colon surgery  2004    colectomy for diverticulosis  . Granulosa tumor excision  2000; 2003; 2004; 2007    "all in my abdomen including small intestines, outside my ?uterus/etc" (08/11/2012)  . Diagnostic laparoscopy      gallbladder removal; abdominal hernia repair  . Tee without cardioversion  12/31/2011    Procedure: TRANSESOPHAGEAL ECHOCARDIOGRAM (TEE);  Surgeon: JaJettie BoozeMD;  [facility] Service: Cardi[facility];  Laterality: N/A;  . Cardioversion  12/31/2011    Procedure: CARDIOVERSION;  Surgeon: JaJettie BoozeMD;  [facility] Service: Cardiovascular;  Laterality: N/A;  . Pacemaker insertion  2/11/4    [name] (SJM) leadless pacemaker (LEADLESS II STUDY PATEINT)  . Cardiac catheterization  09/03/2007    EF 70%;  Failed attempt at PCI to OM  . Cardiac catheterization  11/01/2003    EF 70%  . Coronary angioplasty    . Coronary angioplasty with stent placement  2000; 08/11/2012; 11/12/2012    "3 + 2 +1; total of 6" (11/12/2012)  . Insert / replace / remove pacemaker  03/16/2012    [name] (SJM) leadless pacemaker (LEADLESS II STUDY PATEINT)  . Varicose vein surgery Bilateral 1977  . Hernia repair  2005    "laparoscopic"    Family History  Problem Relation Age of Onset  . Heart attack Father   . Heart attack Brother   . Stroke Mother   . Diabetes Brother   . Diabetes Father   . Hypertension Brother   . Hypertension Father   . Kidney failure Brother  Social History:  reports that she quit smoking about 39 years ago. Her smoking use included Cigarettes. She has a 32 pack-year smoking history. She has never used smokeless tobacco. She reports that she drinks about 3 ounces of alcohol per week. She reports that she does not use illicit drugs.  Allergies:  Allergies  Allergen Reactions  . Bee Venom Anaphylaxis  . Amoxicillin Diarrhea and Nausea And Vomiting  . Other     Pain medications cause severe vomiting.  . Adhesive [Tape] Rash     (Not in a hospital admission)  Results for orders placed during the hospital encounter of 10/04/13 (from the past 48 hour(s))  BASIC METABOLIC PANEL     Status: Abnormal   Collection Time    10/04/13 12:21 PM      Result Value Ref Range   Sodium 144  137 - 147 mEq/L   Potassium 3.9  3.7 - 5.3 mEq/L   Chloride 106  96 - 112 mEq/L   CO2 28  19 - 32 mEq/L   Glucose, Bld 98  70 - 99 mg/dL   BUN 22  6 - 23 mg/dL   Creatinine, Ser 0.88  0.50 - 1.10 mg/dL   Calcium 8.5  8.4 - 10.5 mg/dL   GFR calc non Af Amer 60 (*) >90 mL/min   GFR calc Af Amer 70 (*) >90 mL/min   Comment: (NOTE)     The eGFR has been calculated using the CKD EPI equation.     This calculation has not been validated in all clinical situations.     eGFR's persistently <90 mL/min  signify possible Chronic Kidney     Disease.   Anion gap 10  5 - 15  CBC WITH DIFFERENTIAL     Status: None   Collection Time    10/04/13 12:21 PM      Result Value Ref Range   WBC 6.6  4.0 - 10.5 K/uL   RBC 4.22  3.87 - 5.11 MIL/uL   Hemoglobin 13.2  12.0 - 15.0 g/dL   HCT 38.7  36.0 - 46.0 %   MCV 91.7  78.0 - 100.0 fL   MCH 31.3  26.0 - 34.0 pg   MCHC 34.1  30.0 - 36.0 g/dL   RDW 14.3  11.5 - 15.5 %   Platelets 210  150 - 400 K/uL   Neutrophils Relative % 65  43 - 77 %   Neutro Abs 4.3  1.7 - 7.7 K/uL   Lymphocytes Relative 24  12 - 46 %   Lymphs Abs 1.6  0.7 - 4.0 K/uL   Monocytes Relative 9  3 - 12 %   Monocytes Absolute 0.6  0.1 - 1.0 K/uL   Eosinophils Relative 2  0 - 5 %   Eosinophils Absolute 0.1  0.0 - 0.7 K/uL   Basophils Relative 0  0 - 1 %   Basophils Absolute 0.0  0.0 - 0.1 K/uL  PRO B NATRIURETIC PEPTIDE     Status: Abnormal   Collection Time    10/04/13 12:21 PM      Result Value Ref Range   Pro B Natriuretic peptide (BNP) 1963.0 (*) 0 - 450 pg/mL  APTT     Status: None   Collection Time    10/04/13 12:21 PM      Result Value Ref Range   aPTT 37  24 - 37 seconds   Comment:  IF BASELINE aPTT IS ELEVATED,     SUGGEST PATIENT RISK ASSESSMENT     BE USED TO DETERMINE APPROPRIATE     ANTICOAGULANT THERAPY.  PROTIME-INR     Status: Abnormal   Collection Time    10/04/13 12:21 PM      Result Value Ref Range   Prothrombin Time 30.0 (*) 11.6 - 15.2 seconds   INR 2.86 (*) 0.00 - 1.49  I-STAT TROPOININ, ED     Status: None   Collection Time    10/04/13  1:04 PM      Result Value Ref Range   Troponin i, poc 0.03  0.00 - 0.08 ng/mL   Comment 3            Comment: Due to the release kinetics of cTnI,     a negative result within the first hours     of the onset of symptoms does not rule out     myocardial infarction with certainty.     If myocardial infarction is still suspected,     repeat the test at appropriate intervals.   Dg Chest Port 1  View  10/04/2013   CLINICAL DATA:  Chest tightness.  EXAM: PORTABLE CHEST - 1 VIEW  COMPARISON:  11/30/2012  FINDINGS: Heart is enlarged. There is no pulmonary edema. Lungs are clear. Coronary stent is visible. Implanted loop recorder is again noted. Visualized osseous structures have a normal appearance.  IMPRESSION: 1. Cardiomegaly without pulmonary edema. 2.  No focal acute pulmonary abnormality.   Electronically Signed   By: Shon Hale M.D.   On: 10/04/2013 12:46    Review of Systems  Constitutional: Positive for diaphoresis. Negative for fever.  HENT: Negative for congestion and sore throat.   Respiratory: Negative for cough and shortness of breath.   Cardiovascular: Positive for chest pain. Negative for orthopnea, leg swelling and PND.  Gastrointestinal: Negative for nausea, vomiting, abdominal pain, blood in stool and melena.  Genitourinary: Negative for hematuria.  Musculoskeletal: Negative for myalgias.  Neurological: Negative for dizziness.  All other systems reviewed and are negative.   Blood pressure 123/62, pulse 113, temperature 97.9 F (36.6 C), temperature source Oral, resp. rate 16, height 5' (1.524 m), weight 169 lb 6.4 oz (76.839 kg), SpO2 94.00%. Physical Exam  Nursing note and vitals reviewed. Constitutional: She appears well-developed. No distress.  Obese  HENT:  Head: Normocephalic and atraumatic.  Mouth/Throat: No oropharyngeal exudate.  Eyes: EOM are normal. Pupils are equal, round, and reactive to light. No scleral icterus.  Neck: Normal range of motion. Neck supple. No JVD present.  Cardiovascular: S1 normal and S2 normal.  An irregularly irregular rhythm present.  No murmur heard. Pulses:      Radial pulses are 2+ on the right side, and 2+ on the left side.       Dorsalis pedis pulses are 2+ on the right side, and 2+ on the left side.  Respiratory: Effort normal and breath sounds normal. She has no wheezes. She has no rales.  GI: Soft. Bowel sounds are  normal. She exhibits no distension. There is no tenderness.  Musculoskeletal: She exhibits no edema.  Lymphadenopathy:    She has no cervical adenopathy.  Neurological: She is alert. She exhibits normal muscle tone.  Skin: Skin is warm and dry.  Psychiatric: She has a normal mood and affect.     Assessment/Plan  Principal Problem:   Unstable angina Active Problems:   CAD (coronary artery disease)   HTN (  hypertension)   Hyperlipidemia   Atrial fibrillation   Combined systolic and diastolic heart failure, NYHA class 3   Ischemic cardiomyopathy   Plan:   The patient will be admitted to telemetry.  She is currently pain free.   Continue plavix, metoprolol, imdur, ace, statin, diltiazem and add ASA.  If she has recurrent CP, add IV NTG.  Hold coumadin.  Heparin when INR <2.0.  Currently 2.86.  Left heart cath when INR <1.8.   BNP is elevated to 1900, however, she appears euvolemic.  Monitor i/o's.   Tarri Fuller, [facility] 10/04/2013, 1:55 PM  Patient seen and examined with Tarri Fuller, PA-C. We discussed all aspects of the encounter. I agree with the assessment and plan as stated above.   CP very concerning for [place]. Will admit for cath once INR < 1.8. Start heparin when INR < 2.0. Agree with treatment regimen as above.   Daniel Bensimhon,MD 3:03 PM

## 2013-10-04 NOTE — ED Notes (Signed)
MD at the bedside  

## 2013-10-04 NOTE — ED Notes (Signed)
Cardiology at the bedside.

## 2013-10-04 NOTE — Telephone Encounter (Signed)
°   Patient called in with chest pressure that began on Sunday.  Sunday before church she had chest pressure and took 1 nitro.  Then on Monday she took 2 nitro's and did well all day. @ 9:30pm last night she 1 nitro and rested all night.  Got up this morning and checked her weight before returning to her room she had chest pain and took a nitro, after an hour chest pain was still there.  She took another nitro then called our office. I talked to patient and sent her call to [facility] in Triage.

## 2013-10-04 NOTE — ED Notes (Addendum)
Patient was given a [place] sandwich with peanut butter and gram crackers  And applesauce with cup of cranberry juice.

## 2013-10-04 NOTE — Progress Notes (Signed)
Pt Hr 59-65 in A fib on monitor, pulse manually checked remains in 50s. BP 117/72, no complaints of chest pain. Cards MD on call notified, ordered to hold lopressor this PM but give lisinopril.

## 2013-10-04 NOTE — ED Notes (Signed)
Per Patient, Patient did her normal routine this morning. At breakfast, Patient started to feel pressure in her chest. Patient took three nitro throughout the morning with relief on the third nitro. Patient called MD and MD instructed patient to call EMS. Patient presents with no chest pain currently or any other symptoms. Patient experienced one episode of diarrhea and diaphoresis this morning. Vitals per EMS: 111 HR, 141/91

## 2013-10-04 NOTE — Progress Notes (Signed)
1645 telephone received from ED RN ,Jarrett Soho 1715 Transferred in to [place] via stretcher . Pt ambulated to bed w/o distress . Pt no complain og pain , pleasant and calm . Safety precautions observed . Needs attended

## 2013-10-04 NOTE — ED Provider Notes (Signed)
CSN: [id number]     Arrival date & time 10/04/13  1158 History   First MD Initiated Contact with Patient 10/04/13 1201     Chief Complaint  Patient presents with  . Chest Pain     (Consider location/radiation/quality/duration/timing/severity/associated sxs/prior Treatment) HPI Patient has been experiencing chest pressure over the past 2 days. It has been waxing and waning in nature. At the central pressure the patient has been getting relief with nitroglycerin. The patient ultimately needed 3 nitroglycerin yesterday. Morning she awoke and had chest pressure again she took a nitroglycerin and got some relief. However it was mild exertion even walking to the bathroom she was developing chest pressure again. She did today have associated diaphoresis. No significant shortness of breath.  Past Medical History  Diagnosis Date  . IHD (ischemic heart disease)     Remote MI in 2000 with stent to LAD and LCX. Failed  intervention to OM in 2009. Managed medically since that time.  . Hypertension   . Hyperlipidemia   . Obesity   . Granulosa cell tumor     Has had several surgeries due to her tumor  . Diverticulitis   . Shortness of breath   . Peripheral vascular disease   . Other and unspecified angina pectoris   . MI (myocardial infarction) 06/1998    "when they were putting in the stent" (08/11/2012)  . Varicose veins   . Granulosa cell carcinoma     abd; "last episode was in 2009" (08/11/2012)  . Dysrhythmia   . Atrial flutter   . Tachycardia-bradycardia syndrome     leadless pacemaker ([name]) implanted by Dr [name]  . Atrial fibrillation     "just developed this winter 2013" (08/11/2012)  . CHF (congestive heart failure)   . OSA on CPAP   . Arthritis     "touch in my fingers & right hip" (11/12/2012)  . Combined systolic and diastolic heart failure, NYHA class 3    Past Surgical History  Procedure Laterality Date  . Cholecystectomy  1980's  . Salivary gland surgery  2000's    "had a  little lump removed; granulosa related; it was benign" (08/11/2012)  . US echocardiography  11/21/2003    EF 55-60%  . Cardiovascular stress test  03/23/2009    EF 72%  . Abdominal hysterectomy    . Colon surgery  2004    colectomy for diverticulosis  . Granulosa tumor excision  2000; 2003; 2004; 2007    "all in my abdomen including small intestines, outside my ?uterus/etc" (08/11/2012)  . Diagnostic laparoscopy      gallbladder removal; abdominal hernia repair  . Tee without cardioversion  12/31/2011    Procedure: TRANSESOPHAGEAL ECHOCARDIOGRAM (TEE);  Surgeon: [name], MD;  Location: [place];  Service: Cardiovascular;  Laterality: N/A;  . Cardioversion  12/31/2011    Procedure: CARDIOVERSION;  Surgeon: [name], MD;  Location: [place];  Service: Cardiovascular;  Laterality: N/A;  . Pacemaker insertion  2/11/4    [name] (SJM) leadless pacemaker (LEADLESS II STUDY PATEINT)  . Cardiac catheterization  09/03/2007    EF 70%; Failed attempt at PCI to OM  . Cardiac catheterization  11/01/2003    EF 70%  . Coronary angioplasty    . Coronary angioplasty with stent placement  2000; 08/11/2012; 11/12/2012    "3 + 2 +1; total of 6" (11/12/2012)  . Insert / replace / remove pacemaker  03/16/2012    [name] (SJM) leadless pacemaker (LEADLESS II STUDY  PATEINT)  . Varicose vein surgery Bilateral 1977  . Hernia repair  2005    "laparoscopic"   Family History  Problem Relation Age of Onset  . Heart attack Father   . Heart attack Brother   . Stroke Mother   . Diabetes Brother   . Diabetes Father   . Hypertension Brother   . Hypertension Father   . Kidney failure Brother    History  Substance Use Topics  . Smoking status: Former Smoker -- 1.00 packs/day for 32 years    Types: Cigarettes    Quit date: 02/03/1974  . Smokeless tobacco: Never Used  . Alcohol Use: 3.0 oz/week    5 Glasses of wine per week     Comment: 11/12/2012 "4oz wine w/dinner 5 days/wk"    OB History   Grav Para Term Preterm Abortions TAB SAB Ect Mult Living                 Review of Systems  Constitutional: Negative for fever and chills.  HENT: Negative for congestion and sore throat.   Respiratory: Negative for cough and shortness of breath.   Cardiovascular: Positive for chest pain. Negative for leg swelling.  Gastrointestinal: Negative for vomiting, abdominal pain and diarrhea.  Genitourinary: Negative for dysuria and difficulty urinating.  Musculoskeletal: Negative for arthralgias and myalgias.  Skin: Negative for color change and rash.  Neurological: Positive for light-headedness. Negative for dizziness, weakness and headaches.  Psychiatric/Behavioral: Negative for confusion and agitation.      Allergies  Bee venom; Adhesive; Amoxicillin; and Other  Home Medications   Prior to Admission medications   Medication Sig Start Date End Date Taking? Authorizing Provider  atorvastatin (LIPITOR) 80 MG tablet Take 1 tablet (80 mg total) by mouth every morning. 07/01/13   [name], MD  Calcium Carb-Cholecalciferol (CALTRATE 600+D) 600-800 MG-UNIT TABS Take 1 tablet by mouth every morning.     Historical Provider, MD  clopidogrel (PLAVIX) 75 MG tablet Take 1 tablet (75 mg total) by mouth daily with breakfast. 09/02/13   [name], MD  Coenzyme Q-10 100 MG capsule Take 100 mg by mouth daily.     Historical Provider, MD  dextromethorphan-guaiFENesin (ROBITUSSIN-DM) 10-100 MG/5ML liquid Take 5 mLs by mouth every 4 (four) hours as needed for cough.    Historical Provider, MD  diltiazem (CARDIZEM CD) 240 MG 24 hr capsule TAKE ONE CAPSULE BY MOUTH EVERY DAY 05/31/13   [name], MD  diphenhydrAMINE (BENADRYL) 25 MG tablet Take 25 mg by mouth every 6 (six) hours as needed for itching (and rash).    Historical Provider, MD  docusate sodium (COLACE) 100 MG capsule Take 100 mg by mouth daily as needed for constipation.    Historical Provider, MD   furosemide (LASIX) 40 MG tablet TAKE 2 TABLETS BY MOUTH EVERY MORNING AND TAKE 1 TABLET EVERY EVENING AS NEEDED 10/04/13   [name], MD  isosorbide mononitrate (IMDUR) 60 MG 24 hr tablet 135Take 1 tablet in the morning and 1/2 tablet in the afternoon 06/02/13   [name], MD  lisinopril (PRINIVIL,ZESTRIL) 10 MG tablet Take 0.5 tablets (5 mg total) by mouth 2 (two) times daily. 05/25/13   [name], MD  metoprolol tartrate (LOPRESSOR) 25 MG tablet One tablet twice daily 03/17/13   Thompson Grayer, MD  Multiple Vitamin (MULTIVITAMIN WITH MINERALS) TABS tablet Take 1 tablet by mouth every morning.    Historical Provider, MD  Multiple Vitamins-Minerals (OCUVITE PRESERVISION) TABS  Take 1 tablet by mouth 2 (two) times daily.    Historical Provider, MD  NITROSTAT 0.4 MG SL tablet PLACE 1 TABLET (0.4 MG TOTAL) UNDER THE TONGUE EVERY 5 (FIVE) MINUTES AS NEEDED. FOR CHEST Walsh, MD  Omega-3 Fatty Acids (FISH OIL PO) Take 1,200 mg by mouth daily.     Historical Provider, MD  potassium chloride SA (K-DUR,KLOR-CON) 20 MEQ tablet Take 3 tablets (60 mEq total) by mouth daily. 09/23/13   [name], MD  warfarin (COUMADIN) 2 MG tablet 1 tablet on all days except 1/2 tablet on Tuesday or  as directed by the coumadin clinic 09/09/13   [name], MD   There were no vitals taken for this visit. Physical Exam  Constitutional: She is oriented to person, place, and time. She appears well-developed and well-nourished.  HENT:  Head: Normocephalic and atraumatic.  Eyes: EOM are normal. Pupils are equal, round, and reactive to light.  Neck: Neck supple.  Cardiovascular: Normal rate and intact distal pulses.  An irregular rhythm present.  Pulmonary/Chest: Effort normal and breath sounds normal.  Abdominal: Soft. Bowel sounds are normal. She exhibits no distension. There is no tenderness.  Musculoskeletal: Normal range of motion. She exhibits no edema.   Neurological: She is alert and oriented to person, place, and time. She has normal strength. Coordination normal. GCS eye subscore is 4. GCS verbal subscore is 5. GCS motor subscore is 6.  Skin: Skin is warm, dry and intact.  Psychiatric: She has a normal mood and affect.    ED Course  Procedures (including critical care time) Labs Review Labs Reviewed - No data to display  Imaging Review No results found.   EKG Interpretation None     Cardiology was consult the patient has been evaluated. The patient has been admitted by cardiology. MDM   Final diagnoses:  Unstable angina  Coronary artery disease with unspecified angina pectoris   Patient presents with symptoms of increasing angina. Requiring up to 3 nitroglycerin for pain control. This is a new change for her at this point in time EKG and cardiac enzymes do not show acute MI.    Charlesetta Shanks, MD 10/04/13 [phone]

## 2013-10-04 NOTE — Progress Notes (Signed)
ANTICOAGULATION CONSULT NOTE - Initial Consult  Pharmacy Consult for heparin Indication: atrial fibrillation  Allergies  Allergen Reactions  . Bee Venom Anaphylaxis  . Amoxicillin Diarrhea and Nausea And Vomiting  . Other     Pain medications cause severe vomiting.  . Adhesive [Tape] Rash    Patient Measurements: Height: 5' (152.4 cm) Weight: 169 lb 6.4 oz (76.839 kg) IBW/kg (Calculated) : 45.5 Heparin Dosing Weight:   Vital Signs: Temp: 98.9 F (37.2 C) (09/01 1553) Temp src: Oral (09/01 1553) BP: 116/62 mmHg (09/01 1553) Pulse Rate: 57 (09/01 1553)  Labs:  Recent Labs  10/04/13 1221  HGB 13.2  HCT 38.7  PLT 210  APTT 37  LABPROT 30.0*  INR 2.86*  CREATININE 0.88    Estimated Creatinine Clearance: 45.9 ml/min (by C-G formula based on Cr of 0.88).   Medical History: Past Medical History  Diagnosis Date  . IHD (ischemic heart disease)     Remote MI in 2000 with stent to LAD and LCX. Failed  intervention to OM in 2009. Managed medically since that time.  . Hypertension   . Hyperlipidemia   . Obesity   . Granulosa cell tumor     Has had several surgeries due to her tumor  . Diverticulitis   . Shortness of breath   . Peripheral vascular disease   . Other and unspecified angina pectoris   . MI (myocardial infarction) 06/1998    "when they were putting in the stent" (08/11/2012)  . Varicose veins   . Granulosa cell carcinoma     abd; "last episode was in 2009" (08/11/2012)  . Dysrhythmia   . Atrial flutter   . Tachycardia-bradycardia syndrome     leadless pacemaker ([name]) implanted by Dr [name]  . Atrial fibrillation     "just developed this winter 2013" (08/11/2012)  . CHF (congestive heart failure)   . OSA on CPAP   . Arthritis     "touch in my fingers & right hip" (11/12/2012)  . Combined systolic and diastolic heart failure, NYHA class 3    Assessment: 24 yof presented to the hospital with CP. She is on chronic coumadin for afib. INR today is  2.86, CBC is WNL and no bleeding noted. Planning on holding coumadin for cath when INR<1.8 per MD. Will begin heparin when INR<2.   Goal of Therapy:  Heparin level 0.3-0.7 units/ml Monitor platelets by anticoagulation protocol: Yes   Plan:  1. Start heparin when INR<2 2. Daily INR  Mirka Barbone, Rande Lawman 10/04/2013,4:16 PM

## 2013-10-04 NOTE — ED Notes (Signed)
Patient helped back from the bathroom by Suezanne Jacquet, RN. Patient comfortable and made known of the plan of care.

## 2013-10-04 NOTE — Telephone Encounter (Signed)
Spoke with pt who reports chest pressure which felt like something pushing hard on her chest on Sunday morning. Took one NTG with relief and went to church. Occurred again yesterday and she took 2 NTG with relief. Had another episode before going to bed last night. This AM has had 3 more episodes. Is not having pressure at this time but has used NTG 3 times since 7 AM. Prior to Sunday she was not having any pressure.   I instructed pt to call 911. Pt agreeable with this plan.

## 2013-10-05 ENCOUNTER — Encounter: Payer: Medicare Other | Admitting: Internal Medicine

## 2013-10-05 ENCOUNTER — Encounter (HOSPITAL_COMMUNITY): Payer: Self-pay | Admitting: Physician Assistant

## 2013-10-05 DIAGNOSIS — Z7982 Long term (current) use of aspirin: Secondary | ICD-10-CM | POA: Diagnosis not present

## 2013-10-05 DIAGNOSIS — I4891 Unspecified atrial fibrillation: Secondary | ICD-10-CM

## 2013-10-05 DIAGNOSIS — E785 Hyperlipidemia, unspecified: Secondary | ICD-10-CM

## 2013-10-05 DIAGNOSIS — Z9861 Coronary angioplasty status: Secondary | ICD-10-CM | POA: Diagnosis not present

## 2013-10-05 DIAGNOSIS — E669 Obesity, unspecified: Secondary | ICD-10-CM | POA: Diagnosis present

## 2013-10-05 DIAGNOSIS — Z841 Family history of disorders of kidney and ureter: Secondary | ICD-10-CM | POA: Diagnosis not present

## 2013-10-05 DIAGNOSIS — Z87891 Personal history of nicotine dependence: Secondary | ICD-10-CM | POA: Diagnosis not present

## 2013-10-05 DIAGNOSIS — I10 Essential (primary) hypertension: Secondary | ICD-10-CM | POA: Diagnosis present

## 2013-10-05 DIAGNOSIS — I739 Peripheral vascular disease, unspecified: Secondary | ICD-10-CM | POA: Diagnosis present

## 2013-10-05 DIAGNOSIS — Z91038 Other insect allergy status: Secondary | ICD-10-CM | POA: Diagnosis not present

## 2013-10-05 DIAGNOSIS — Y831 Surgical operation with implant of artificial internal device as the cause of abnormal reaction of the patient, or of later complication, without mention of misadventure at the time of the procedure: Secondary | ICD-10-CM | POA: Diagnosis present

## 2013-10-05 DIAGNOSIS — Z8249 Family history of ischemic heart disease and other diseases of the circulatory system: Secondary | ICD-10-CM | POA: Diagnosis not present

## 2013-10-05 DIAGNOSIS — I2510 Atherosclerotic heart disease of native coronary artery without angina pectoris: Secondary | ICD-10-CM | POA: Diagnosis not present

## 2013-10-05 DIAGNOSIS — I2589 Other forms of chronic ischemic heart disease: Secondary | ICD-10-CM | POA: Diagnosis present

## 2013-10-05 DIAGNOSIS — Z7901 Long term (current) use of anticoagulants: Secondary | ICD-10-CM | POA: Diagnosis not present

## 2013-10-05 DIAGNOSIS — I509 Heart failure, unspecified: Secondary | ICD-10-CM | POA: Diagnosis not present

## 2013-10-05 DIAGNOSIS — Z823 Family history of stroke: Secondary | ICD-10-CM | POA: Diagnosis not present

## 2013-10-05 DIAGNOSIS — Z950 Presence of cardiac pacemaker: Secondary | ICD-10-CM | POA: Diagnosis not present

## 2013-10-05 DIAGNOSIS — I200 Unstable angina: Secondary | ICD-10-CM | POA: Diagnosis not present

## 2013-10-05 DIAGNOSIS — Z7902 Long term (current) use of antithrombotics/antiplatelets: Secondary | ICD-10-CM | POA: Diagnosis not present

## 2013-10-05 DIAGNOSIS — G4733 Obstructive sleep apnea (adult) (pediatric): Secondary | ICD-10-CM | POA: Diagnosis present

## 2013-10-05 DIAGNOSIS — Z888 Allergy status to other drugs, medicaments and biological substances status: Secondary | ICD-10-CM | POA: Diagnosis not present

## 2013-10-05 DIAGNOSIS — Z79899 Other long term (current) drug therapy: Secondary | ICD-10-CM | POA: Diagnosis not present

## 2013-10-05 DIAGNOSIS — I5042 Chronic combined systolic (congestive) and diastolic (congestive) heart failure: Secondary | ICD-10-CM | POA: Diagnosis not present

## 2013-10-05 DIAGNOSIS — T82897A Other specified complication of cardiac prosthetic devices, implants and grafts, initial encounter: Secondary | ICD-10-CM | POA: Diagnosis not present

## 2013-10-05 DIAGNOSIS — R079 Chest pain, unspecified: Secondary | ICD-10-CM | POA: Diagnosis present

## 2013-10-05 DIAGNOSIS — E876 Hypokalemia: Secondary | ICD-10-CM | POA: Diagnosis not present

## 2013-10-05 DIAGNOSIS — Z833 Family history of diabetes mellitus: Secondary | ICD-10-CM | POA: Diagnosis not present

## 2013-10-05 DIAGNOSIS — I252 Old myocardial infarction: Secondary | ICD-10-CM | POA: Diagnosis not present

## 2013-10-05 DIAGNOSIS — Z881 Allergy status to other antibiotic agents status: Secondary | ICD-10-CM | POA: Diagnosis not present

## 2013-10-05 LAB — BASIC METABOLIC PANEL
Anion gap: 10 (ref 5–15)
BUN: 20 mg/dL (ref 6–23)
CALCIUM: 8.2 mg/dL — AB (ref 8.4–10.5)
CHLORIDE: 106 meq/L (ref 96–112)
CO2: 26 meq/L (ref 19–32)
Creatinine, Ser: 0.83 mg/dL (ref 0.50–1.10)
GFR calc Af Amer: 75 mL/min — ABNORMAL LOW (ref >90)
GFR calc non Af Amer: 64 mL/min — ABNORMAL LOW (ref >90)
GLUCOSE: 99 mg/dL (ref 70–99)
Potassium: 3.9 mEq/L (ref 3.7–5.3)
Sodium: 142 mEq/L (ref 137–147)

## 2013-10-05 LAB — TROPONIN I
Troponin I: <0.3 ng/mL (ref <0.30)
Troponin I: <0.3 ng/mL (ref <0.30)

## 2013-10-05 LAB — PROTIME-INR
INR: 2.92 — AB (ref 0.00–1.49)
PROTHROMBIN TIME: 30.5 s — AB (ref 11.6–15.2)

## 2013-10-05 LAB — LIPID PANEL
Cholesterol: 131 mg/dL (ref 0–200)
HDL: 61 mg/dL (ref >39)
LDL Cholesterol: 51 mg/dL (ref 0–99)
Total CHOL/HDL Ratio: 2.1 RATIO
Triglycerides: 93 mg/dL (ref <150)
VLDL: 19 mg/dL (ref 0–40)

## 2013-10-05 MED ORDER — NITROGLYCERIN 2 % TD OINT
1.0000 [in_us] | TOPICAL_OINTMENT | Freq: Four times a day (QID) | TRANSDERMAL | Status: DC
  Administered 2013-10-05 – 2013-10-06 (×5): 1 [in_us] via TOPICAL
  Filled 2013-10-05: qty 30

## 2013-10-05 MED ORDER — METOPROLOL TARTRATE 25 MG PO TABS
37.5000 mg | ORAL_TABLET | Freq: Two times a day (BID) | ORAL | Status: DC
  Administered 2013-10-05 – 2013-10-08 (×6): 37.5 mg via ORAL
  Filled 2013-10-05 (×7): qty 1

## 2013-10-05 MED ORDER — ISOSORBIDE MONONITRATE ER 60 MG PO TB24
90.0000 mg | ORAL_TABLET | Freq: Every day | ORAL | Status: DC
  Administered 2013-10-08: 90 mg via ORAL
  Filled 2013-10-05: qty 1

## 2013-10-05 MED ORDER — HYDRALAZINE HCL 20 MG/ML IJ SOLN: 10.0000 mg | INTRAMUSCULAR | Status: DC | PRN

## 2013-10-05 MED ORDER — ISOSORBIDE MONONITRATE ER 30 MG PO TB24
30.0000 mg | ORAL_TABLET | Freq: Every day | ORAL | Status: DC
  Administered 2013-10-05 – 2013-10-07 (×3): 30 mg via ORAL
  Filled 2013-10-05 (×3): qty 1

## 2013-10-05 MED ORDER — ISOSORBIDE MONONITRATE ER 60 MG PO TB24: 60.0000 mg | ORAL_TABLET | Freq: Every day | ORAL | Status: DC

## 2013-10-05 NOTE — Progress Notes (Signed)
Patient Name: Patty [name] Date of Encounter: 10/05/2013  Principal Problem:   Unstable angina Active Problems:   CAD (coronary artery disease)   HTN (hypertension)   Hyperlipidemia   Atrial fibrillation   Combined systolic and diastolic heart failure, NYHA class 3    Patient Profile: 80 yo female w/ hx stents in the LAD, CFX x 2 (last one in 11/2012), [employer] PPM, afib/coumadin, OSA-CPAP, PVD, obesity, HTN, HLD, Sys/dias CHF w/ EF 40% 2014. Admitted 09/01 w/ USAP, cath when INR comes down. BNP elevated but no S/S overload.  Called to see pt because she woke with chest pain this am.  SUBJECTIVE: Episode chest pain this am was the worst since it started, 7/10, pressure.  Episodes of chest pain that led to admit began 72 hr after taking the first dose of a steroid taper (for wrist pain). Wrist is better, only 1 dose taken. SBP not usually this high, generally < 130 at home.   OBJECTIVE Filed Vitals:   10/05/13 0609 10/05/13 0612 10/05/13 0620 10/05/13 0624  BP: 185/103 156/100 143/94   Pulse:    110  Temp:      TempSrc:      Resp:      Height:      Weight:      SpO2: 99%       Intake/Output Summary (Last 24 hours) at 10/05/13 0650 Last data filed at 10/05/13 0553  Gross per 24 hour  Intake    600 ml  Output    700 ml  Net   -100 ml   Filed Weights   10/04/13 1202 10/04/13 1709  Weight: 169 lb 6.4 oz (76.839 kg) 169 lb 3.2 oz (76.749 kg)    PHYSICAL EXAM General: Well developed, well nourished, female in no acute distress. Head: Normocephalic, atraumatic.  Neck: Supple without bruits, JVD not elevated. Lungs:  Resp regular and unlabored, few dry rales. Heart: Irregular, S1, S2, no S3, S4, 2/6 murmur; no rub. Abdomen: Soft, non-tender, non-distended, BS + x 4.  Extremities: No clubbing, cyanosis, no edema.  Neuro: Alert and oriented X 3. Moves all extremities spontaneously. Psych: Normal affect.  LABS: CBC:  Recent Labs  10/04/13 1221  WBC 6.6    NEUTROABS 4.3  HGB 13.2  HCT 38.7  MCV 91.7  PLT 210   INR:  Recent Labs  10/05/13 0510  INR 5.62*   Basic Metabolic Panel:  Recent Labs  10/04/13 1221  NA 144  K 3.9  CL 106  CO2 28  GLUCOSE 98  BUN 22  CREATININE 0.88  CALCIUM 8.5   Cardiac Enzymes:  Recent Labs  10/04/13 1848 10/04/13 2330 10/05/13 0540  TROPONINI <0.30 <0.30 <0.30    Recent Labs  10/04/13 1304  TROPIPOC 0.03   BNP: Pro B Natriuretic peptide (BNP)  Date/Time Value Ref Range Status  10/04/2013 12:21 PM 1963.0* 0 - 450 pg/mL Final  11/30/2012 11:01 PM 1713.0* 0 - 450 pg/mL Final   Lab Results  Component Value Date   CHOL 131 10/05/2013   HDL 61 10/05/2013   LDLCALC 51 10/05/2013   TRIG 93 10/05/2013   CHOLHDL 2.1 10/05/2013    TELE:  Atrial fib, rate 50s last pm, now 100s, LBBB is old    ECG: 10/05/2013 Vent. rate 118 BPM PR interval 112 ms QRS duration 154 ms QT/QTc 386/541 ms P-R-T axes * -19 116  Radiology/Studies: Dg Chest Port 1 View 10/04/2013   CLINICAL DATA:  Chest tightness.  EXAM: PORTABLE CHEST - 1 VIEW  COMPARISON:  11/30/2012  FINDINGS: Heart is enlarged. There is no pulmonary edema. Lungs are clear. Coronary stent is visible. Implanted loop recorder is again noted. Visualized osseous structures have a normal appearance.  IMPRESSION: 1. Cardiomegaly without pulmonary edema. 2.  No focal acute pulmonary abnormality.   Electronically Signed   By: Shon Hale M.D.   On: 10/04/2013 12:46     Current Medications:  . aspirin EC  81 mg Oral Daily  . atorvastatin  80 mg Oral q1800  . clopidogrel  75 mg Oral Q breakfast  . diltiazem  240 mg Oral Daily  . isosorbide mononitrate  30 mg Oral QHS  . isosorbide mononitrate  60 mg Oral Daily  . lisinopril  5 mg Oral BID  . metoprolol tartrate  25 mg Oral BID  . omega-3 acid ethyl esters  2 g Oral Daily      ASSESSMENT AND PLAN: Principal Problem:   Unstable angina - continue to cycle enzymes, add NTG paste w/ recurrent pain,  if this does not control, add IV NTG and tx stepdown. Otherwise, continue current therapy and cath when INR low enough.  Active Problems:   CAD (coronary artery disease) - see above    HTN (hypertension) - BB held last pm because HR 50s (advised RN it could be given since pt has PPM), generally good control. Perhaps steroid dose elevated it.    Hyperlipidemia - good control, ck LFTs as well.    Atrial fibrillation - rate up this am since BB held last pm, give BB, continue Dilt.    Combined systolic and diastolic heart failure, NYHA class 3 - no sx overload, follow I/O, weights.    Chronic anticoag - coumadin on hold, add heparin when INR < 2  Signed, Rosaria Ferries , PA-C 6:50 AM 10/05/2013    Patient seen and examined. Agree with assessment and plan. INR today 2.92. She had significant chest pain earlier this am. AF rate increased; will resume metoprolol at an increased dose; she has a leadless pacemaker implanted by Dr [name]; no need to hold for HR. Will increase Imdur. Start heparin once INR <2.0 and cath when<1.8   Troy Sine, MD, [facility] 10/05/2013 8:14 AM

## 2013-10-05 NOTE — Progress Notes (Signed)
Pt complaining of 8/10 chest pain/pressure this AM, non-radiating, pt also sweaty. EKG performed, BP 172/110 manual. HR 110-115. Per Rapid Response RN 1 Nitro given to patient SL, chest pain 4/10. BP 156/100.  Rhonda Barrett notified of event, ordered to give another SL nitro and place order for PRN hydralazine if BP >270 systolic.  Pt given a second SL Nitro, now chest pain free. BP 143/94, HR 100-115. 10AM Lopressor 25 mg PO given to pt as PM dose was held last night d/t low HR. PA, Rhonda Barrett notified.

## 2013-10-05 NOTE — Care Management Note (Addendum)
    Page 1 of 1   10/07/2013     2:27:55 PM CARE MANAGEMENT NOTE 10/07/2013  Patient:  DEITRA, CRAINE   Account Number:  [id number]  Date Initiated:  10/05/2013  Documentation initiated by:  HUTCHINSON,CRYSTAL  Subjective/Objective Assessment:   Unstable angina     Action/Plan:   CM to follow for disposition needs   Anticipated DC Date:  10/07/2013   Anticipated DC Plan:  [place]  CM consult      Choice offered to / List presented to:             Status of service:  Completed, signed off Medicare Important Message given?  YES (If response is "NO", the following Medicare IM given date fields will be blank) Date Medicare IM given:  10/05/2013 Medicare IM given by:  HUTCHINSON,CRYSTAL Date Additional Medicare IM given:  10/07/2013 Additional Medicare IM given by:  Esau Fridman  Discharge Disposition:  HOME/SELF CARE  Per UR Regulation:  Reviewed for med. necessity/level of care/duration of stay  If discussed at [place] of Stay Meetings, dates discussed:    Comments:  Crystal Hutchinson RN, BSN, MSHL, CCM  Nurse - Case Manager,  (Unit [phone]  10/05/2013 Social:  From home with husband.  Hx/o no falls over the past year.  Independent with ADLs Home DME:  Cpap (Provider:  [facility]) PCP:  Dr. Nehemiah Settle x 30 year and recently retired; [place] PCP within same practice. Dispositon Plan:  Home / Self ccare CM will continue to monitor for needs as indicated.

## 2013-10-05 NOTE — Progress Notes (Signed)
ANTICOAGULATION CONSULT NOTE - Initial Consult  Pharmacy Consult for heparin Indication: atrial fibrillation  Allergies  Allergen Reactions  . Bee Venom Anaphylaxis  . Amoxicillin Diarrhea and Nausea And Vomiting  . Other     Pain medications cause severe vomiting.  . Adhesive [Tape] Rash    Patient Measurements: Height: 5' (152.4 cm) Weight: 169 lb 1.6 oz (76.703 kg) (a scale) IBW/kg (Calculated) : 45.5 Heparin Dosing Weight: 62.8kg  Vital Signs: Temp: 97.9 F (36.6 C) (09/02 0202) Temp src: Oral (09/02 0202) BP: 143/94 mmHg (09/02 0620) Pulse Rate: 110 (09/02 0624)  Labs:  Recent Labs  10/04/13 1221 10/04/13 1848 10/04/13 2330 10/05/13 0510 10/05/13 0540  HGB 13.2  --   --   --   --   HCT 38.7  --   --   --   --   PLT 210  --   --   --   --   APTT 37  --   --   --   --   LABPROT 30.0*  --   --  30.5*  --   INR 2.86*  --   --  2.92*  --   CREATININE 0.88  --   --  0.83  --   TROPONINI  --  <0.30 <0.30  --  <0.30    Estimated Creatinine Clearance: 48.7 ml/min (by C-G formula based on Cr of 0.83).   Medical History: Past Medical History  Diagnosis Date  . IHD (ischemic heart disease)     Remote MI in 2000 with stent to LAD and LCX. Failed  intervention to OM in 2009. Managed medically since that time.  . Hypertension   . Hyperlipidemia   . Obesity   . Granulosa cell tumor     Has had several surgeries due to her tumor  . Diverticulitis   . Shortness of breath   . Peripheral vascular disease   . Other and unspecified angina pectoris   . MI (myocardial infarction) 06/1998    "when they were putting in the stent" (08/11/2012)  . Varicose veins   . Granulosa cell carcinoma     abd; "last episode was in 2009" (08/11/2012)  . Dysrhythmia   . Atrial flutter   . Tachycardia-bradycardia syndrome     leadless pacemaker ([name]) implanted by Dr [name]  . Atrial fibrillation     "just developed this winter 2013" (08/11/2012)  . CHF (congestive heart failure)    . OSA on CPAP   . Arthritis     "touch in my fingers & right hip" (11/12/2012)  . Combined systolic and diastolic heart failure, NYHA class 3   . Coronary artery disease   . Pacemaker    Assessment: 83yof presented to the hospital with CP. She is on chronic coumadin for afib which has been placed on hold while awaiting cath. INR (2.92) remains > 2 so heparin will not yet be started. Per Cards, cath when INR is < 1.8.  -  H/H and Plts wnl - No significant bleeding reported  Goal of Therapy:  Heparin level 0.3-0.7 units/ml Monitor platelets by anticoagulation protocol: Yes   Plan:  1. Start heparin when INR<2 2. Daily INR  Earleen Newport [phone] 10/05/2013,11:19 AM

## 2013-10-06 ENCOUNTER — Encounter (HOSPITAL_COMMUNITY): Payer: Self-pay | Admitting: Cardiology

## 2013-10-06 LAB — HEPATIC FUNCTION PANEL
ALBUMIN: 2.6 g/dL — AB (ref 3.5–5.2)
ALT: 20 U/L (ref 0–35)
AST: 27 U/L (ref 0–37)
Alkaline Phosphatase: 92 U/L (ref 39–117)
BILIRUBIN TOTAL: 0.6 mg/dL (ref 0.3–1.2)
Bilirubin, Direct: <0.2 mg/dL (ref 0.0–0.3)
TOTAL PROTEIN: 5.6 g/dL — AB (ref 6.0–8.3)

## 2013-10-06 LAB — PROTIME-INR
INR: 2.39 — ABNORMAL HIGH (ref 0.00–1.49)
INR: 1.84 — ABNORMAL HIGH (ref 0.00–1.49)
PROTHROMBIN TIME: 26.1 s — AB (ref 11.6–15.2)
PROTHROMBIN TIME: 21.3 s — AB (ref 11.6–15.2)

## 2013-10-06 MED ORDER — FUROSEMIDE 40 MG PO TABS
40.0000 mg | ORAL_TABLET | Freq: Every day | ORAL | Status: DC
  Administered 2013-10-06 – 2013-10-08 (×3): 40 mg via ORAL
  Filled 2013-10-06 (×3): qty 1

## 2013-10-06 MED ORDER — HEPARIN (PORCINE) IN NACL 100-0.45 UNIT/ML-% IJ SOLN
1000.0000 [IU]/h | INTRAMUSCULAR | Status: DC
  Administered 2013-10-06: 850 [IU]/h via INTRAVENOUS
  Administered 2013-10-07: 1000 [IU]/h via INTRAVENOUS
  Filled 2013-10-06 (×2): qty 250

## 2013-10-06 NOTE — Progress Notes (Signed)
Subjective: No pain, has been NPO  Objective: Vital signs in last 24 hours: Temp:  [97.7 F (36.5 C)-98.1 F (36.7 C)] 97.8 F (36.6 C) (09/03 0527) Pulse Rate:  [65] 65 (09/03 0527) Resp:  [15-18] 18 (09/02 2130) BP: (115-138)/(48-67) 115/48 mmHg (09/03 0527) SpO2:  [94 %-97 %] 94 % (09/03 0527) Weight:  [171 lb (77.565 kg)] 171 lb (77.565 kg) (09/03 0536) Weight change: -4.8 oz (-0.136 kg) Last BM Date: 10/04/13 Intake/Output from previous day: -1160  09/02 0701 - 09/03 0700 In: 240 [P.O.:240] Out: 1400 [Urine:1400] Intake/Output this shift:    PE: General:Pleasant affect, NAD Skin:Warm and dry, brisk capillary refill HEENT:normocephalic, sclera clear, mucus membranes moist Neck:supple, no JVD, no bruits  Heart:S1S2 RRR without murmur, gallup, rub or click Lungs:clear without rales, rhonchi, or wheezes GGY:IRSW, non tender, + BS, do not palpate liver spleen or masses Ext:no lower ext edema, 2+ pedal pulses, 2+ radial pulses Neuro:alert and oriented, MAE, follows commands, + facial symmetry   Lab Results:  Recent Labs  10/04/13 1221  WBC 6.6  HGB 13.2  HCT 38.7  PLT 210   BMET  Recent Labs  10/04/13 1221 10/05/13 0510  NA 144 142  K 3.9 3.9  CL 106 106  CO2 28 26  GLUCOSE 98 99  BUN 22 20  CREATININE 0.88 0.83  CALCIUM 8.5 8.2*    Recent Labs  10/04/13 2330 10/05/13 0540  TROPONINI <0.30 <0.30    Lab Results  Component Value Date   CHOL 131 10/05/2013   HDL 61 10/05/2013   LDLCALC 51 10/05/2013   TRIG 93 10/05/2013   CHOLHDL 2.1 10/05/2013     Hepatic Function Panel  Recent Labs  10/06/13 0405  PROT 5.6*  ALBUMIN 2.6*  AST 27  ALT 20  ALKPHOS 92  BILITOT 0.6  BILIDIR <0.2  IBILI NOT CALCULATED    Recent Labs  10/05/13 0510  CHOL 131   No results found for this basename: PROTIME,  in the last 72 hours     Studies/Results: Dg Chest Port 1 View  10/04/2013   CLINICAL DATA:  Chest tightness.  EXAM: PORTABLE CHEST -  1 VIEW  COMPARISON:  11/30/2012  FINDINGS: Heart is enlarged. There is no pulmonary edema. Lungs are clear. Coronary stent is visible. Implanted loop recorder is again noted. Visualized osseous structures have a normal appearance.  IMPRESSION: 1. Cardiomegaly without pulmonary edema. 2.  No focal acute pulmonary abnormality.   Electronically Signed   By: Shon Hale M.D.   On: 10/04/2013 12:46    Medications: I have reviewed the patient's current medications. Scheduled Meds: . aspirin EC  81 mg Oral Daily  . atorvastatin  80 mg Oral q1800  . clopidogrel  75 mg Oral Q breakfast  . diltiazem  240 mg Oral Daily  . isosorbide mononitrate  30 mg Oral QHS  . [START ON 10/08/2013] isosorbide mononitrate  90 mg Oral Daily  . lisinopril  5 mg Oral BID  . metoprolol tartrate  37.5 mg Oral BID  . nitroGLYCERIN  1 inch Topical 4 times per day  . omega-3 acid ethyl esters  2 g Oral Daily   Continuous Infusions:  PRN Meds:.acetaminophen, hydrALAZINE, nitroGLYCERIN, ondansetron (ZOFRAN) IV  Assessment/Plan: Principal Problem:  Unstable angina - continue to cycle enzymes, Imdur increased, if this does not control, add IV NTG and tx stepdown. Otherwise, continue current therapy and cath when INR low enough. No further pain.  Negative troponins.  Resume meals today and plan cath for tomorrow.   Active Problems:  CAD (coronary artery disease) - see above   HTN (hypertension) - BB held last pm because HR 50s (advised RN it could be given since pt has PPM), generally good control. Perhaps steroid dose elevated it.   Hyperlipidemia - good control, ck LFTs as well.  Lipid Panel     Component Value Date/Time   CHOL 131 10/05/2013 0510   TRIG 93 10/05/2013 0510   HDL 61 10/05/2013 0510   CHOLHDL 2.1 10/05/2013 0510   VLDL 19 10/05/2013 0510   LDLCALC 51 10/05/2013 0510    Atrial fibrillation - rate up this am since BB held last pm, give BB, continue Dilt.   Combined systolic and diastolic heart failure, NYHA class  3 - no sx overload, follow I/O, weights.   Chronic anticoag - coumadin on hold, add heparin when INR < 2; INR 2.39 now   Cath if INR <1.8 tomorrow   LOS: 2 days   Time spent with pt. :15 minutes. [facility] R  Nurse Practitioner Certified Pager [phone] or after 5pm and on weekends call [phone] 10/06/2013, 8:23 AM    Patient seen and examined. Agree with assessment and plan. No recurrent chest pain with increased nitrates and beta blocker yesterday.  Pt states that she had been on lasix 80 in am and 40 pm; none since admission.  Will resume only at 40 mg daily, in anticipation of contrast load with cath. No CHF signs on exam presently. Plan cath tomorrow if INR <1.8   Troy Sine, MD, [facility] 10/06/2013 8:53 AM

## 2013-10-06 NOTE — Progress Notes (Addendum)
  ANTICOAGULATION CONSULT NOTE - Follow Up Consult  Pharmacy Consult for Heparin Indication: atrial fibrillation  Allergies  Allergen Reactions  . Bee Venom Anaphylaxis  . Amoxicillin Diarrhea and Nausea And Vomiting  . Other     Pain medications cause severe vomiting.  . Adhesive [Tape] Rash    Patient Measurements: Height: 5' (152.4 cm) Weight: 171 lb (77.565 kg) (a scale) IBW/kg (Calculated) : 45.5 Heparin Dosing Weight: 63kg  Vital Signs: Temp: 97.8 F (36.6 C) (09/03 0527) Temp src: Oral (09/03 0527) BP: 136/62 mmHg (09/03 1008) Pulse Rate: 79 (09/03 1008)  Labs:  Recent Labs  10/04/13 1221 10/04/13 1848 10/04/13 2330 10/05/13 0510 10/05/13 0540 10/06/13 0405  HGB 13.2  --   --   --   --   --   HCT 38.7  --   --   --   --   --   PLT 210  --   --   --   --   --   APTT 37  --   --   --   --   --   LABPROT 30.0*  --   --  30.5*  --  26.1*  INR 2.86*  --   --  2.92*  --  2.39*  CREATININE 0.88  --   --  0.83  --   --   TROPONINI  --  <0.30 <0.30  --  <0.30  --     Estimated Creatinine Clearance: 48.9 ml/min (by C-G formula based on Cr of 0.83).  Assessment: 83yof presented to the hospital with CP. She is on chronic coumadin for afib which has been placed on hold while awaiting cath. INR (2.39) remains > 2 but trended down with held dose - will recheck INR this afternoon to re-evaluate if heparin can be started. Per Cards, cath when INR is < 1.8.  - No new CBC - follow-up in AM - No significant bleeding reported  Goal of Therapy:  Heparin level 0.3-0.7 units/ml Monitor platelets by anticoagulation protocol: Yes   Plan:  1. Start heparin when INR<2 - will recheck INR @ 1800 2. Daily INR and CBC   Earleen Newport [phone] 10/06/2013,10:18 AM     =======================================    Addendum: - INR now sub-therapeutic - heparin dosing weight = 63 kg   Plan: - Start heparin gtt at 850 units/hr, no bolus - Check 8 hr HL - Daily  HL / CBC    Danaya Geddis D. Mina Marble, PharmD, BCPS Pager:  [phone] 10/06/2013, 7:43 PM

## 2013-10-06 NOTE — Plan of Care (Signed)
Problem: Phase I Progression Outcomes Goal: Hemodynamically stable Outcome: Progressing No acute events this shift.  Remains chest pain free.  NPO at present pending cath.

## 2013-10-07 ENCOUNTER — Encounter (HOSPITAL_COMMUNITY)
Admission: EM | Disposition: A | Discharge status: Home / Self Care | Payer: Self-pay | Source: Home / Self Care | Attending: Interventional Cardiology

## 2013-10-07 DIAGNOSIS — I2510 Atherosclerotic heart disease of native coronary artery without angina pectoris: Secondary | ICD-10-CM

## 2013-10-07 HISTORY — PX: LEFT HEART CATHETERIZATION WITH CORONARY ANGIOGRAM: SHX5451

## 2013-10-07 HISTORY — PX: PERCUTANEOUS CORONARY STENT INTERVENTION (PCI-S): SHX5485

## 2013-10-07 HISTORY — PX: FRACTIONAL FLOW RESERVE WIRE: SHX5839

## 2013-10-07 LAB — BASIC METABOLIC PANEL
Anion gap: 9 (ref 5–15)
BUN: 21 mg/dL (ref 6–23)
CALCIUM: 8.3 mg/dL — AB (ref 8.4–10.5)
CO2: 29 mEq/L (ref 19–32)
Chloride: 106 mEq/L (ref 96–112)
Creatinine, Ser: 0.94 mg/dL (ref 0.50–1.10)
GFR calc Af Amer: 64 mL/min — ABNORMAL LOW (ref >90)
GFR, EST NON AFRICAN AMERICAN: 55 mL/min — AB (ref >90)
GLUCOSE: 95 mg/dL (ref 70–99)
Potassium: 3.6 mEq/L — ABNORMAL LOW (ref 3.7–5.3)
SODIUM: 144 meq/L (ref 137–147)

## 2013-10-07 LAB — PROTIME-INR
INR: 1.79 — AB (ref 0.00–1.49)
PROTHROMBIN TIME: 20.8 s — AB (ref 11.6–15.2)

## 2013-10-07 LAB — HEPARIN LEVEL (UNFRACTIONATED): HEPARIN UNFRACTIONATED: 0.1 [IU]/mL — AB (ref 0.30–0.70)

## 2013-10-07 SURGERY — LEFT HEART CATHETERIZATION WITH CORONARY ANGIOGRAM
Anesthesia: LOCAL

## 2013-10-07 MED ORDER — ADENOSINE 12 MG/4ML IV SOLN
16.0000 mL | Freq: Once | INTRAVENOUS | Status: AC
  Administered 2013-10-07: 48 mg via INTRAVENOUS
  Filled 2013-10-07 (×2): qty 16

## 2013-10-07 MED ORDER — ACETAMINOPHEN 325 MG PO TABS
650.0000 mg | ORAL_TABLET | ORAL | Status: DC | PRN
  Administered 2013-10-08: 650 mg via ORAL
  Filled 2013-10-07: qty 2

## 2013-10-07 MED ORDER — SODIUM CHLORIDE 0.9 % IJ SOLN: 3.0000 mL | INTRAMUSCULAR | Status: DC | PRN

## 2013-10-07 MED ORDER — SODIUM CHLORIDE 0.9 % IV SOLN
INTRAVENOUS | Status: DC
  Administered 2013-10-07: 07:00:00 via INTRAVENOUS

## 2013-10-07 MED ORDER — ASPIRIN 81 MG PO CHEW
81.0000 mg | CHEWABLE_TABLET | ORAL | Status: AC
  Administered 2013-10-07: 81 mg via ORAL
  Filled 2013-10-07: qty 1

## 2013-10-07 MED ORDER — SODIUM CHLORIDE 0.9 % IV SOLN: 1.0000 mL/kg/h | INTRAVENOUS | Status: AC

## 2013-10-07 MED ORDER — CLOPIDOGREL BISULFATE 75 MG PO TABS
75.0000 mg | ORAL_TABLET | Freq: Every day | ORAL | Status: DC
  Administered 2013-10-08: 08:00:00 75 mg via ORAL
  Filled 2013-10-07: qty 1

## 2013-10-07 MED ORDER — ONDANSETRON HCL 4 MG/2ML IJ SOLN: 4.0000 mg | Freq: Four times a day (QID) | INTRAMUSCULAR | Status: DC | PRN

## 2013-10-07 MED ORDER — HEPARIN SODIUM (PORCINE) 1000 UNIT/ML IJ SOLN
INTRAMUSCULAR | Status: AC
  Filled 2013-10-07: qty 1

## 2013-10-07 MED ORDER — HEPARIN (PORCINE) IN NACL 100-0.45 UNIT/ML-% IJ SOLN
1000.0000 [IU]/h | INTRAMUSCULAR | Status: DC
  Administered 2013-10-07: 1000 [IU]/h via INTRAVENOUS
  Filled 2013-10-07: qty 250

## 2013-10-07 MED ORDER — CLOPIDOGREL BISULFATE 300 MG PO TABS
ORAL_TABLET | ORAL | Status: AC
Start: 2013-10-07 — End: 2013-10-07
  Filled 2013-10-07: qty 1

## 2013-10-07 MED ORDER — WARFARIN - PHARMACIST DOSING INPATIENT: Freq: Every day | Status: DC

## 2013-10-07 MED ORDER — VERAPAMIL HCL 2.5 MG/ML IV SOLN
INTRAVENOUS | Status: AC
Start: 2013-10-07 — End: 2013-10-07
  Filled 2013-10-07: qty 2

## 2013-10-07 MED ORDER — SODIUM CHLORIDE 0.9 % IV SOLN: 250.0000 mL | INTRAVENOUS | Status: DC | PRN

## 2013-10-07 MED ORDER — HEPARIN (PORCINE) IN NACL 2-0.9 UNIT/ML-% IJ SOLN
INTRAMUSCULAR | Status: AC
  Filled 2013-10-07: qty 1000

## 2013-10-07 MED ORDER — WARFARIN SODIUM 2 MG PO TABS
2.0000 mg | ORAL_TABLET | Freq: Once | ORAL | Status: AC
  Administered 2013-10-07: 2 mg via ORAL
  Filled 2013-10-07 (×2): qty 1

## 2013-10-07 MED ORDER — FENTANYL CITRATE 0.05 MG/ML IJ SOLN
INTRAMUSCULAR | Status: AC
  Filled 2013-10-07: qty 2

## 2013-10-07 MED ORDER — SODIUM CHLORIDE 0.9 % IJ SOLN: 3.0000 mL | Freq: Two times a day (BID) | INTRAMUSCULAR | Status: DC

## 2013-10-07 MED ORDER — LIDOCAINE HCL (PF) 1 % IJ SOLN
INTRAMUSCULAR | Status: AC
  Filled 2013-10-07: qty 30

## 2013-10-07 MED ORDER — ASPIRIN 81 MG PO CHEW
81.0000 mg | CHEWABLE_TABLET | Freq: Every day | ORAL | Status: DC
  Filled 2013-10-07: qty 1

## 2013-10-07 MED ORDER — MIDAZOLAM HCL 2 MG/2ML IJ SOLN
INTRAMUSCULAR | Status: AC
Start: 2013-10-07 — End: 2013-10-07
  Filled 2013-10-07: qty 2

## 2013-10-07 NOTE — Progress Notes (Signed)
  [place] for Heparin Indication: atrial fibrillation  Allergies  Allergen Reactions  . Bee Venom Anaphylaxis  . Amoxicillin Diarrhea and Nausea And Vomiting  . Other     Pain medications cause severe vomiting.  . Adhesive [Tape] Rash    Patient Measurements: Height: 5' (152.4 cm) Weight: 171 lb (77.565 kg) (a scale) IBW/kg (Calculated) : 45.5 Heparin Dosing Weight: 63kg  Vital Signs: Temp: 97.7 F (36.5 C) (09/04 0215) Temp src: Oral (09/04 0215) BP: 124/47 mmHg (09/04 0215) Pulse Rate: 71 (09/04 0215)  Labs:  Recent Labs  10/04/13 1221 10/04/13 1848 10/04/13 2330 10/05/13 0510 10/05/13 0540 10/06/13 0405 10/06/13 1846 10/07/13 0413  HGB 13.2  --   --   --   --   --   --   --   HCT 38.7  --   --   --   --   --   --   --   PLT 210  --   --   --   --   --   --   --   APTT 37  --   --   --   --   --   --   --   LABPROT 30.0*  --   --  30.5*  --  26.1* 21.3* 20.8*  INR 2.86*  --   --  2.92*  --  2.39* 1.84* 1.79*  HEPARINUNFRC  --   --   --   --   --   --   --  0.10*  CREATININE 0.88  --   --  0.83  --   --   --  0.94  TROPONINI  --  <0.30 <0.30  --  <0.30  --   --   --     Estimated Creatinine Clearance: 43.2 ml/min (by C-G formula based on Cr of 0.94).  Assessment:  80 y.o. female with chest pain, h/o Afib and Coumadin on hold, for heparin  Goal of Therapy:  Heparin level 0.3-0.7 units/ml Monitor platelets by anticoagulation protocol: Yes   Plan:  Increase Heparin 1000 units/hr Check heparin level in 8 hours.  Phillis Knack, PharmD, BCPS

## 2013-10-07 NOTE — Progress Notes (Signed)
ANTICOAGULATION CONSULT NOTE - Follow Up Consult  Pharmacy Consult for coumadin and heparin  Indication: atrial fibrillation  Allergies  Allergen Reactions  . Bee Venom Anaphylaxis  . Amoxicillin Diarrhea and Nausea And Vomiting  . Other     Pain medications cause severe vomiting.  . Adhesive [Tape] Rash    Patient Measurements: Height: 5' (152.4 cm) Weight: 172 lb 6.4 oz (78.2 kg) IBW/kg (Calculated) : 45.5 Heparin Dosing Weight:   Vital Signs: Temp: 97.8 F (36.6 C) (09/04 1154) Temp src: Oral (09/04 1154) BP: 153/64 mmHg (09/04 1200) Pulse Rate: 64 (09/04 1200)  Labs:  Recent Labs  10/04/13 1848 10/04/13 2330  10/05/13 0510 10/05/13 0540 10/06/13 0405 10/06/13 1846 10/07/13 0413  LABPROT  --   --   < > 30.5*  --  26.1* 21.3* 20.8*  INR  --   --   < > 2.92*  --  2.39* 1.84* 1.79*  HEPARINUNFRC  --   --   --   --   --   --   --  0.10*  CREATININE  --   --   --  0.83  --   --   --  0.94  TROPONINI <0.30 <0.30  --   --  <0.30  --   --   --   < > = values in this interval not displayed.  Estimated Creatinine Clearance: 43.4 ml/min (by C-G formula based on Cr of 0.94).   Medications:  Scheduled:  . aspirin  81 mg Oral Daily  . aspirin EC  81 mg Oral Daily  . atorvastatin  80 mg Oral q1800  . clopidogrel  75 mg Oral Q breakfast  . [START ON 10/08/2013] clopidogrel  75 mg Oral Q breakfast  . diltiazem  240 mg Oral Daily  . furosemide  40 mg Oral Daily  . isosorbide mononitrate  30 mg Oral QHS  . [START ON 10/08/2013] isosorbide mononitrate  90 mg Oral Daily  . lisinopril  5 mg Oral BID  . metoprolol tartrate  37.5 mg Oral BID  . omega-3 acid ethyl esters  2 g Oral Daily   Infusions:  . sodium chloride    . heparin      Assessment: 80 yo female with afib will be started back on heparin and coumadin post cath.  INR today is 1.79 (was on coumadin 2 mg po daily except 1 mg on Tuesdays PTA and admitting INR was therapeutic).  Per MD's request, will start heparin  back at 1630 today  Goal of Therapy:  Heparin level 0.3-0.7 units/ml ; INR 2-3  Monitor platelets by anticoagulation protocol: Yes   Plan:  1. Start heparin at 1000 units/hr at 1630 per MD's request. Coumadin 2mg  po x1 tonight 2. 8hr heparin level 3. Daily PT/INR, heparin level and CBC   Patty [name], Patty [name] 10/07/2013,12:50 PM

## 2013-10-07 NOTE — H&P (View-Only) (Signed)
Subjective: No pain, has been NPO  Objective: Vital signs in last 24 hours: Temp:  [97.7 F (36.5 C)-98.1 F (36.7 C)] 97.8 F (36.6 C) (09/03 0527) Pulse Rate:  [65] 65 (09/03 0527) Resp:  [15-18] 18 (09/02 2130) BP: (115-138)/(48-67) 115/48 mmHg (09/03 0527) SpO2:  [94 %-97 %] 94 % (09/03 0527) Weight:  [171 lb (77.565 kg)] 171 lb (77.565 kg) (09/03 0536) Weight change: -4.8 oz (-0.136 kg) Last BM Date: 10/04/13 Intake/Output from previous day: -1160  09/02 0701 - 09/03 0700 In: 240 [P.O.:240] Out: 1400 [Urine:1400] Intake/Output this shift:    PE: General:Pleasant affect, NAD Skin:Warm and dry, brisk capillary refill HEENT:normocephalic, sclera clear, mucus membranes moist Neck:supple, no JVD, no bruits  Heart:S1S2 RRR without murmur, gallup, rub or click Lungs:clear without rales, rhonchi, or wheezes LGX:QJJH, non tender, + BS, do not palpate liver spleen or masses Ext:no lower ext edema, 2+ pedal pulses, 2+ radial pulses Neuro:alert and oriented, MAE, follows commands, + facial symmetry   Lab Results:  Recent Labs  10/04/13 1221  WBC 6.6  HGB 13.2  HCT 38.7  PLT 210   BMET  Recent Labs  10/04/13 1221 10/05/13 0510  NA 144 142  K 3.9 3.9  CL 106 106  CO2 28 26  GLUCOSE 98 99  BUN 22 20  CREATININE 0.88 0.83  CALCIUM 8.5 8.2*    Recent Labs  10/04/13 2330 10/05/13 0540  TROPONINI <0.30 <0.30    Lab Results  Component Value Date   CHOL 131 10/05/2013   HDL 61 10/05/2013   LDLCALC 51 10/05/2013   TRIG 93 10/05/2013   CHOLHDL 2.1 10/05/2013     Hepatic Function Panel  Recent Labs  10/06/13 0405  PROT 5.6*  ALBUMIN 2.6*  AST 27  ALT 20  ALKPHOS 92  BILITOT 0.6  BILIDIR <0.2  IBILI NOT CALCULATED    Recent Labs  10/05/13 0510  CHOL 131   No results found for this basename: PROTIME,  in the last 72 hours     Studies/Results: Dg Chest Port 1 View  10/04/2013   CLINICAL DATA:  Chest tightness.  EXAM: PORTABLE CHEST -  1 VIEW  COMPARISON:  11/30/2012  FINDINGS: Heart is enlarged. There is no pulmonary edema. Lungs are clear. Coronary stent is visible. Implanted loop recorder is again noted. Visualized osseous structures have a normal appearance.  IMPRESSION: 1. Cardiomegaly without pulmonary edema. 2.  No focal acute pulmonary abnormality.   Electronically Signed   By: Shon Hale M.D.   On: 10/04/2013 12:46    Medications: I have reviewed the patient's current medications. Scheduled Meds: . aspirin EC  81 mg Oral Daily  . atorvastatin  80 mg Oral q1800  . clopidogrel  75 mg Oral Q breakfast  . diltiazem  240 mg Oral Daily  . isosorbide mononitrate  30 mg Oral QHS  . [START ON 10/08/2013] isosorbide mononitrate  90 mg Oral Daily  . lisinopril  5 mg Oral BID  . metoprolol tartrate  37.5 mg Oral BID  . nitroGLYCERIN  1 inch Topical 4 times per day  . omega-3 acid ethyl esters  2 g Oral Daily   Continuous Infusions:  PRN Meds:.acetaminophen, hydrALAZINE, nitroGLYCERIN, ondansetron (ZOFRAN) IV  Assessment/Plan: Principal Problem:  Unstable angina - continue to cycle enzymes, Imdur increased, if this does not control, add IV NTG and tx stepdown. Otherwise, continue current therapy and cath when INR low enough. No further pain.  Negative troponins.  Resume meals today and plan cath for tomorrow.   Active Problems:  CAD (coronary artery disease) - see above   HTN (hypertension) - BB held last pm because HR 50s (advised RN it could be given since pt has PPM), generally good control. Perhaps steroid dose elevated it.   Hyperlipidemia - good control, ck LFTs as well.  Lipid Panel     Component Value Date/Time   CHOL 131 10/05/2013 0510   TRIG 93 10/05/2013 0510   HDL 61 10/05/2013 0510   CHOLHDL 2.1 10/05/2013 0510   VLDL 19 10/05/2013 0510   LDLCALC 51 10/05/2013 0510    Atrial fibrillation - rate up this am since BB held last pm, give BB, continue Dilt.   Combined systolic and diastolic heart failure, NYHA class  3 - no sx overload, follow I/O, weights.   Chronic anticoag - coumadin on hold, add heparin when INR < 2; INR 2.39 now   Cath if INR <1.8 tomorrow   LOS: 2 days   Time spent with pt. :15 minutes. [facility]  Nurse Practitioner Certified Pager [phone] or after 5pm and on weekends call [phone] 10/06/2013, 8:23 AM    Patient seen and examined. Agree with assessment and plan. No recurrent chest pain with increased nitrates and beta blocker yesterday.  Pt states that she had been on lasix 80 in am and 40 pm; none since admission.  Will resume only at 40 mg daily, in anticipation of contrast load with cath. No CHF signs on exam presently. Plan cath tomorrow if INR <1.8   Troy Sine, MD, [facility] 10/06/2013 8:53 AM

## 2013-10-07 NOTE — Interval H&P Note (Signed)
Cath Lab Visit (complete for each Cath Lab visit)  Clinical Evaluation Leading to the Procedure:   ACS: Yes.    Non-ACS:    Anginal Classification: CCS IV  Anti-ischemic medical therapy: Maximal Therapy (2 or more classes of medications)  Non-Invasive Test Results: No non-invasive testing performed  Prior CABG: No previous CABG      History and Physical Interval Note:  10/07/2013 9:51 AM  Patty [name]  has presented today for surgery, with the diagnosis of cp  The various methods of treatment have been discussed with the patient and family. After consideration of risks, benefits and other options for treatment, the patient has consented to  Procedure(s): LEFT HEART CATHETERIZATION WITH CORONARY ANGIOGRAM (N/A) as a surgical intervention .  The patient's history has been reviewed, patient examined, no change in status, stable for surgery.  I have reviewed the patient's chart and labs.  Questions were answered to the patient's satisfaction.     Luvern Mcisaac S.

## 2013-10-07 NOTE — Progress Notes (Signed)
TR BAND REMOVAL  LOCATION:  right radial  DEFLATED PER PROTOCOL:  Yes.    TIME BAND OFF / DRESSING APPLIED:   1530   SITE UPON ARRIVAL:   Level 0  SITE AFTER BAND REMOVAL:  Level 0  REVERSE ALLEN'S TEST:    positive  CIRCULATION SENSATION AND MOVEMENT:  Within Normal Limits  Yes.    COMMENTS:    

## 2013-10-07 NOTE — CV Procedure (Signed)
PROCEDURE:  Left heart catheterization with selective coronary angiography, left ventriculogram.  FFR prox LAD , PCI mid LAD  INDICATIONS:  Unstable angina  The risks, benefits, and details of the procedure were explained to the patient.  The patient verbalized understanding and wanted to proceed.  Informed written consent was obtained.  PROCEDURE TECHNIQUE:  After Xylocaine anesthesia a 16F slender sheath was placed in the right radial artery with a single anterior needle wall stick.   IV heparin was given. Right coronary angiography was done using a Judkins R4 guide catheter.  Left coronary angiography was done using a Judkins L3.5 guide catheter.  Left ventriculography was done using a pigtail catheter.  The intervention was performed. Please see below for details. A TR band was used for hemostasis.   CONTRAST:  Total of 130 cc.  COMPLICATIONS:  None.    HEMODYNAMICS:  Aortic pressure was 146/63; LV pressure was 138/9; LVEDP 18.  There was no gradient between the left ventricle and aorta.    ANGIOGRAPHIC DATA:   The left main coronary artery is widely patent.  The left anterior descending artery is a large vessel which wraps around the apex. There is a moderate lesion in the proximal vessel. There is severe restenosis noted at the proximal portion of the prior stented segment, up to 75% in some views.  The left circumflex artery is a large vessel. The proximal stent is widely patent. There is an occluded obtuse marginal one which fills by brisk left to left collaterals. The second obtuse marginal is widely patent with moderate proximal disease. This is a medium size vessel. The remainder of the circumflex is patent.  The right coronary artery is a large dominant vessel. There is mild to moderate diffuse atherosclerosis throughout the vessel. In the mid vessel, there is a 40% stenosis. The posterior lateral artery is medium-sized and patent. The posterior descending artery is medium  size and widely patent.  LEFT VENTRICULOGRAM:  Left ventricular angiogram was done in the 30 RAO projection and revealed normal left ventricular wall motion and systolic function with an estimated ejection fraction of 55 %.  LVEDP was 18 mmHg.  PCI NARRATIVE: A CLS 3.5 guiding catheter was used to engage the left main. Additional heparin was given. ACT was used to check that the heparin was therapeutic.  A pro-water wire was placed across the area disease in the LAD. A 2.75 x 10 cutting balloon was advanced to the area of restenosis in the mid LAD. Several inflations were performed. There is an excellent angiographic result. At this point, we wanted to assess the severity of the proximal lesion. A NAVUS FFR catheter was advanced over the pro-water wire. Resting FFR was 0.98. After maximal hyperemia with IV adenosine, FFR decreased to 0.83. This was not felt to be significant. We then repeated an angiogram it appeared that the angioplasty site had started to recoil. We then decided to place a 2.75 x 12 promus drug-eluting stent deployed at high pressure. The stent was then postdilated with a 3.25 x 8 noncompliant lune. There was an excellent angiographic result. The proximal lesion remained unchanged. Intra-coronary nitroglycerin was given. The patient tolerated the procedure well.  IMPRESSIONS:  1. Normal left main coronary artery. 2. Restenosis, up to 75% of the old mid left anterior descending artery stent which had been placed in 2001. The more proximal drug-eluting stent from 2014 was patent.  The area of mid LAD restenosis was treated with a  cutting balloon angioplasty which recoil. Subsequently a 2.75 x 12 Promus drug-eluting stent was deployed and postdilated to 3.3 mm in diameter. 3. Patent proximal left circumflex artery stent.  Chronically occluded OM1 with brisk left to left collaterals. This is unchanged from prior angiogram. 4. Mild to moderate diffuse disease in the right coronary  artery. 5. Normal left ventricular systolic function.  LVEDP 18 mmHg.  Ejection fraction 55 %.  6.  FFR of the proximal LAD lesion was 0.83 after adenosine. This is not significant. Continue medical therapy.  RECOMMENDATION:  Restart heparin in several hours. Continue clopidogrel indefinitely. Add aspirin for now. Would stop the aspirin when her INR becomes therapeutic. Restart Coumadin tonight. Continue aggressive secondary prevention. Possible discharge tomorrow if there are no complications.  Marland Kitchen

## 2013-10-08 ENCOUNTER — Other Ambulatory Visit: Payer: Self-pay | Admitting: Physician Assistant

## 2013-10-08 DIAGNOSIS — Z7901 Long term (current) use of anticoagulants: Secondary | ICD-10-CM

## 2013-10-08 LAB — CBC
HEMATOCRIT: 35.8 % — AB (ref 36.0–46.0)
HEMOGLOBIN: 12 g/dL (ref 12.0–15.0)
MCH: 31 pg (ref 26.0–34.0)
MCHC: 33.5 g/dL (ref 30.0–36.0)
MCV: 92.5 fL (ref 78.0–100.0)
Platelets: 184 10*3/uL (ref 150–400)
RBC: 3.87 MIL/uL (ref 3.87–5.11)
RDW: 14.5 % (ref 11.5–15.5)
WBC: 6.6 10*3/uL (ref 4.0–10.5)

## 2013-10-08 LAB — BASIC METABOLIC PANEL
Anion gap: 9 (ref 5–15)
BUN: 19 mg/dL (ref 6–23)
CHLORIDE: 105 meq/L (ref 96–112)
CO2: 29 meq/L (ref 19–32)
Calcium: 8.1 mg/dL — ABNORMAL LOW (ref 8.4–10.5)
Creatinine, Ser: 0.83 mg/dL (ref 0.50–1.10)
GFR calc Af Amer: 75 mL/min — ABNORMAL LOW (ref >90)
GFR calc non Af Amer: 64 mL/min — ABNORMAL LOW (ref >90)
GLUCOSE: 107 mg/dL — AB (ref 70–99)
POTASSIUM: 3.4 meq/L — AB (ref 3.7–5.3)
Sodium: 143 mEq/L (ref 137–147)

## 2013-10-08 LAB — PROTIME-INR
INR: 1.37 (ref 0.00–1.49)
Prothrombin Time: 16.9 seconds — ABNORMAL HIGH (ref 11.6–15.2)

## 2013-10-08 LAB — HEPARIN LEVEL (UNFRACTIONATED): Heparin Unfractionated: 0.22 IU/mL — ABNORMAL LOW (ref 0.30–0.70)

## 2013-10-08 MED ORDER — POTASSIUM CHLORIDE CRYS ER 20 MEQ PO TBCR
40.0000 meq | EXTENDED_RELEASE_TABLET | Freq: Once | ORAL | Status: AC
  Administered 2013-10-08: 40 meq via ORAL
  Filled 2013-10-08: qty 2

## 2013-10-08 MED ORDER — METOPROLOL TARTRATE 25 MG PO TABS: 37.5000 mg | ORAL_TABLET | Freq: Two times a day (BID) | ORAL | Status: DC

## 2013-10-08 MED ORDER — ASPIRIN 81 MG PO TBEC: 81.0000 mg | DELAYED_RELEASE_TABLET | Freq: Every day | ORAL | Status: DC

## 2013-10-08 MED ORDER — HEPARIN (PORCINE) IN NACL 100-0.45 UNIT/ML-% IJ SOLN
1150.0000 [IU]/h | INTRAMUSCULAR | Status: DC
  Filled 2013-10-08: qty 250

## 2013-10-08 NOTE — Progress Notes (Signed)
ANTICOAGULATION CONSULT NOTE - Follow Up Consult  Pharmacy Consult for heparin  Indication: atrial fibrillation  Allergies  Allergen Reactions  . Bee Venom Anaphylaxis  . Amoxicillin Diarrhea and Nausea And Vomiting  . Other     Pain medications cause severe vomiting.  . Adhesive [Tape] Rash    Patient Measurements: Height: 5' (152.4 cm) Weight: 174 lb 13.2 oz (79.3 kg) IBW/kg (Calculated) : 45.5 Heparin Dosing Weight: 64 kg  Vital Signs: Temp: 98 F (36.7 C) (09/05 0008) Temp src: Oral (09/05 0008) BP: 140/63 mmHg (09/05 0008) Pulse Rate: 71 (09/05 0008)  Labs:  Recent Labs  10/05/13 0510 10/05/13 0540  10/06/13 1846 10/07/13 0413 10/08/13 0109  HGB  --   --   --   --   --  12.0  HCT  --   --   --   --   --  35.8*  PLT  --   --   --   --   --  184  LABPROT 30.5*  --   < > 21.3* 20.8* 16.9*  INR 2.92*  --   < > 1.84* 1.79* 1.37  HEPARINUNFRC  --   --   --   --  0.10* 0.22*  CREATININE 0.83  --   --   --  0.94  --   TROPONINI  --  <0.30  --   --   --   --   < > = values in this interval not displayed.  Estimated Creatinine Clearance: 43.7 ml/min (by C-G formula based on Cr of 0.94).  Assessment: 80 yo female on heparin bridge to coumadin for afib. Pt s/p cath today. Heparin level 0.22 (subtherapeutic) on 1000 units/hr. No issues with line or bleeding per RN.  Goal of Therapy:  Heparin level 0.3-0.7 units/ml Monitor platelets by anticoagulation protocol: Yes   Plan:  1. Increase heparin to 1150 units/hr 2. 8hr heparin level  Sherlon Handing, PharmD, BCPS Clinical pharmacist, pager [phone] 10/08/2013,2:00 AM

## 2013-10-08 NOTE — Progress Notes (Signed)
CARDIAC REHAB PHASE I   PRE:  Rate/Rhythm: Atrial Fib 76  BP:    Sitting: 117/40     SaO2: 96% room air  MODE:  Ambulation: 500 ft   POST:  Rate/Rhythem: 77  BP:    Sitting: 113/70     SaO2:97% room air  [phone] Patient ambulated in hallway without complaints or chest pain. Patient stopped times one to rest. Reviewed exercise instructions end points of exercise when to call 911. [name] participates in [employer] and water aerobic's at [employer] in [place] and is not interested in phase 2 cardiac rehab at this time. Reviewed sublingual nitroglycerin and heart healthy diet.  [name], [name]

## 2013-10-08 NOTE — Discharge Summary (Signed)
Physician Discharge Summary      Patient ID: Patty [name] MRN: [id number] DOB/AGE: 1930/06/03 80 y.o.  Admit date: 10/04/2013 Discharge date: 10/08/2013  Admission Diagnoses:  Unstable angina  Discharge Diagnoses:  Principal Problem:   Unstable angina Active Problems:   CAD (coronary artery disease)   HTN (hypertension)   Hyperlipidemia   Atrial fibrillation   Combined systolic and diastolic heart failure, NYHA class 3   Discharged Condition: stable  Hospital Course:   The patient is an 80 yo female with a history of CAD, tachy/brady with PPM([employer]), afib on coumadin, OSA-CPAP, PVD, obesity, HTN, HLD, Sys/dias CHF. Her last echo was 12/2011 and ef was 60-65%. She had a left heart cath on 11/12/12 which showed normal left main coronary artery. Patent stents in the left anterior descending artery. Patent stents in the proximal to mid left circumflex artery. 80% proximal lesion outside of the stented area. This was the culprit for her symptoms and this was successfully treated with a 3.0 x 8 promise drug-eluting stent postdilated to 3.5 mm in diameter. Moderate small vessel disease in the circumflex branches. Mild to moderate diffuse disease in the right coronary artery . Mildly decreased left ventricular systolic function. Improved LV function compared to the July ventriculogram. LVEDP 21 mmHg. Ejection fraction 40 %.   The patient reports she first developed chest "pressure" in the middle of her chest last Sunday. SHe took a nitroglycerine which relieved the pain. Since then she has had increasing frequency of chest pressure and today she took three NTG SL, with relief each time and reoccurance after just 30 minutes. She reports feeling "clammy" each time. The patient currently denies nausea, vomiting, fever, shortness of breath, orthopnea, dizziness, PND, cough, congestion, abdominal pain, hematochezia, melena, lower extremity edema.   The patient was admitted.  Coumadin was stopped.   IV heparin started once INR < 2.0.  She went for a LHC when INR was < 1.80.  She underwent successful cutting balloon angioplasty and Promus DES placement to the mid LAD.   Patent prox LAD stent and circ stent. Chronically occluded OM1. EF 55%. Coumadin was restarted. ASA, plavix, lipitor, diliazem 240, lasix 40, imdur 90, lopressor 37.5bid. DC ASA when INR therapeutic. BP and HR stable. Resume home dose coumadin.  We will recheck INR on Tuesday.  She ambulated well with Cardiac rehabThe patient was seen by Dr. Caryl Comes who felt she was stable for DC home.   Consults:  None  Significant Diagnostic Studies:  PROCEDURE: Left heart catheterization with selective coronary angiography, left ventriculogram. FFR prox LAD , PCI mid LAD  INDICATIONS: Unstable angina  The risks, benefits, and details of the procedure were explained to the patient. The patient verbalized understanding and wanted to proceed. Informed written consent was obtained.  PROCEDURE TECHNIQUE: After Xylocaine anesthesia a 52F slender sheath was placed in the right radial artery with a single anterior needle wall stick. IV heparin was given. Right coronary angiography was done using a Judkins R4 guide catheter. Left coronary angiography was done using a Judkins L3.5 guide catheter. Left ventriculography was done using a pigtail catheter. The intervention was performed. Please see below for details. A TR band was used for hemostasis.  CONTRAST: Total of 130 cc.  COMPLICATIONS: None.  HEMODYNAMICS: Aortic pressure was 146/63; LV pressure was 138/9; LVEDP 18. There was no gradient between the left ventricle and aorta.  ANGIOGRAPHIC DATA: The left main coronary artery is widely patent.  The left anterior descending artery is  a large vessel which wraps around the apex. There is a moderate lesion in the proximal vessel. There is severe restenosis noted at the proximal portion of the prior stented segment, up to 75% in some views.  The left  circumflex artery is a large vessel. The proximal stent is widely patent. There is an occluded obtuse marginal one which fills by brisk left to left collaterals. The second obtuse marginal is widely patent with moderate proximal disease. This is a medium size vessel. The remainder of the circumflex is patent.  The right coronary artery is a large dominant vessel. There is mild to moderate diffuse atherosclerosis throughout the vessel. In the mid vessel, there is a 40% stenosis. The posterior lateral artery is medium-sized and patent. The posterior descending artery is medium size and widely patent.  LEFT VENTRICULOGRAM: Left ventricular angiogram was done in the 30 RAO projection and revealed normal left ventricular wall motion and systolic function with an estimated ejection fraction of 55 %. LVEDP was 18 mmHg.  PCI NARRATIVE: A CLS 3.5 guiding catheter was used to engage the left main. Additional heparin was given. ACT was used to check that the heparin was therapeutic. A pro-water wire was placed across the area disease in the LAD. A 2.75 x 10 cutting balloon was advanced to the area of restenosis in the mid LAD. Several inflations were performed. There is an excellent angiographic result. At this point, we wanted to assess the severity of the proximal lesion. A NAVUS FFR catheter was advanced over the pro-water wire. Resting FFR was 0.98. After maximal hyperemia with IV adenosine, FFR decreased to 0.83. This was not felt to be significant. We then repeated an angiogram it appeared that the angioplasty site had started to recoil. We then decided to place a 2.75 x 12 promus drug-eluting stent deployed at high pressure. The stent was then postdilated with a 3.25 x 8 noncompliant lune. There was an excellent angiographic result. The proximal lesion remained unchanged. Intra-coronary nitroglycerin was given. The patient tolerated the procedure well.  IMPRESSIONS:  1. Normal left main coronary  artery. 2. Restenosis, up to 75% of the old mid left anterior descending artery stent which had been placed in 2001. The more proximal drug-eluting stent from 2014 was patent. The area of mid LAD restenosis was treated with a cutting balloon angioplasty which recoil. Subsequently a 2.75 x 12 Promus drug-eluting stent was deployed and postdilated to 3.3 mm in diameter. 3. Patent proximal left circumflex artery stent. Chronically occluded OM1 with brisk left to left collaterals. This is unchanged from prior angiogram. 4. Mild to moderate diffuse disease in the right coronary artery. 5. Normal left ventricular systolic function. LVEDP 18 mmHg. Ejection fraction 55 %. 6. FFR of the proximal LAD lesion was 0.83 after adenosine. This is not significant. Continue medical therapy.  RECOMMENDATION: Restart heparin in several hours. Continue clopidogrel indefinitely. Add aspirin for now. Would stop the aspirin when her INR becomes therapeutic. Restart Coumadin tonight. Continue aggressive secondary prevention. Possible discharge tomorrow if there are no complications.   Lipid Panel     Component Value Date/Time   CHOL 131 10/05/2013 0510   TRIG 93 10/05/2013 0510   HDL 61 10/05/2013 0510   CHOLHDL 2.1 10/05/2013 0510   VLDL 19 10/05/2013 0510   LDLCALC 51 10/05/2013 0510     Treatments: See above  Discharge Exam: Blood pressure 152/53, pulse 70, temperature 98.4 F (36.9 C), temperature source Oral, resp. rate 18, height 5' (1.524 m),  weight 174 lb 13.2 oz (79.3 kg), SpO2 95.00%.   Disposition: 01-Home or Self Care      Discharge Instructions   Diet - low sodium heart healthy    Complete by:  As directed      Discharge instructions    Complete by:  As directed   No lifting with the right arm for three day.     Increase activity slowly    Complete by:  As directed             Medication List         acetaminophen 325 MG tablet  Commonly known as:  TYLENOL  Take 325 mg by mouth every 6 (six)  hours as needed (pain).     aspirin 81 MG EC tablet  Take 1 tablet (81 mg total) by mouth daily.     atorvastatin 80 MG tablet  Commonly known as:  LIPITOR  Take 1 tablet (80 mg total) by mouth every morning.     CALTRATE 600+D 600-800 MG-UNIT Tabs  Generic drug:  Calcium Carb-Cholecalciferol  Take 1 tablet by mouth every morning.     clopidogrel 75 MG tablet  Commonly known as:  PLAVIX  Take 1 tablet (75 mg total) by mouth daily with breakfast.     Coenzyme Q-10 100 MG capsule  Take 100 mg by mouth daily.     diltiazem 240 MG 24 hr capsule  Commonly known as:  CARDIZEM CD  Take 240 mg by mouth daily.     FISH OIL PO  Take 1,200 mg by mouth daily.     furosemide 40 MG tablet  Commonly known as:  LASIX  Take 40-80 mg by mouth 2 (two) times daily. Take 80mg  every morning and 40mg  every evening     isosorbide mononitrate 60 MG 24 hr tablet  Commonly known as:  IMDUR  Take 0.5-1 tablets (30-60 mg total) by mouth 2 (two) times daily. 60mg  every morning and 30mg  every evening     lisinopril 10 MG tablet  Commonly known as:  PRINIVIL,ZESTRIL  Take 0.5 tablets (5 mg total) by mouth 2 (two) times daily.     metoprolol tartrate 25 MG tablet  Commonly known as:  LOPRESSOR  Take 1.5 tablets (37.5 mg total) by mouth 2 (two) times daily.     multivitamin with minerals Tabs tablet  Take 1 tablet by mouth every morning.     nitroGLYCERIN 0.4 MG SL tablet  Commonly known as:  NITROSTAT  Place 0.4 mg under the tongue every 5 (five) minutes as needed for chest pain.     OCUVITE PRESERVISION Tabs  Take 1 tablet by mouth daily.     potassium chloride SA 20 MEQ tablet  Commonly known as:  K-DUR,KLOR-CON  Take 3 tablets (60 mEq total) by mouth daily.     warfarin 2 MG tablet  Commonly known as:  COUMADIN  Take 1-2 mg by mouth daily. 1mg  on Tuesday and 2mg  daily all other days        Greater than 30 minutes was spent completing the patient's discharge.    SignedTarri Fuller, PA-C 10/08/2013, 9:11 AM

## 2013-10-08 NOTE — Progress Notes (Signed)
Subjective: No Cp since Wed.  Objective: Vital signs in last 24 hours: Temp:  [97.8 F (36.6 C)-98.7 F (37.1 C)] 98.2 F (36.8 C) (09/05 0423) Pulse Rate:  [62-88] 67 (09/05 0423) Resp:  [16-18] 18 (09/05 0423) BP: (140-169)/(50-73) 144/61 mmHg (09/05 0423) SpO2:  [92 %-99 %] 94 % (09/05 0423) Weight:  [174 lb 13.2 oz (79.3 kg)] 174 lb 13.2 oz (79.3 kg) (09/05 0008) Last BM Date: 10/04/13  Intake/Output from previous day: 09/04 0701 - 09/05 0700 In: 1271.3 [P.O.:320; I.V.:951.3] Out: 4150 [Urine:4150] Intake/Output this shift: Total I/O In: -  Out: 450 [Urine:450]  Medications Current Facility-Administered Medications  Medication Dose Route Frequency Provider Last Rate Last Dose  . acetaminophen (TYLENOL) tablet 650 mg  650 mg Oral Q4H PRN Tarri Fuller, PA-C      . acetaminophen (TYLENOL) tablet 650 mg  650 mg Oral Q4H PRN [name], MD   650 mg at 10/08/13 0021  . aspirin chewable tablet 81 mg  81 mg Oral Daily [name], MD      . aspirin EC tablet 81 mg  81 mg Oral Daily Tarri Fuller, PA-C   81 mg at 10/06/13 1021  . atorvastatin (LIPITOR) tablet 80 mg  80 mg Oral q1800 Tarri Fuller, PA-C   80 mg at 10/07/13 1404  . clopidogrel (PLAVIX) tablet 75 mg  75 mg Oral Q breakfast Tarri Fuller, PA-C   75 mg at 10/07/13 2751  . clopidogrel (PLAVIX) tablet 75 mg  75 mg Oral Q breakfast [name], MD      . diltiazem (CARDIZEM CD) 24 hr capsule 240 mg  240 mg Oral Daily Tarri Fuller, PA-C   240 mg at 10/07/13 1359  . furosemide (LASIX) tablet 40 mg  40 mg Oral Daily Cecilie Kicks, NP   40 mg at 10/07/13 1359  . heparin ADULT infusion 100 units/mL (25000 units/250 mL)  1,150 Units/hr Intravenous Continuous Juanda Chance Amend, RPH 11.5 mL/hr at 10/08/13 0700 1,150 Units/hr at 10/08/13 0700  . hydrALAZINE (APRESOLINE) injection 10 mg  10 mg Intravenous Q4H PRN Rhonda G Barrett, PA-C      . isosorbide mononitrate (IMDUR) 24 hr tablet 30 mg  30 mg Oral QHS  Rhonda G Barrett, PA-C   30 mg at 10/07/13 2141  . isosorbide mononitrate (IMDUR) 24 hr tablet 90 mg  90 mg Oral Daily Troy Sine, MD      . lisinopril (PRINIVIL,ZESTRIL) tablet 5 mg  5 mg Oral BID Tarri Fuller, PA-C   5 mg at 10/07/13 2140  . metoprolol tartrate (LOPRESSOR) tablet 37.5 mg  37.5 mg Oral BID Troy Sine, MD   37.5 mg at 10/07/13 2141  . nitroGLYCERIN (NITROSTAT) SL tablet 0.4 mg  0.4 mg Sublingual Q5 Min x 3 PRN Tarri Fuller, PA-C   0.4 mg at 10/05/13 0617  . omega-3 acid ethyl esters (LOVAZA) capsule 2 g  2 g Oral Daily Tarri Fuller, PA-C   2 g at 10/07/13 1403  . ondansetron (ZOFRAN) injection 4 mg  4 mg Intravenous Q6H PRN Tarri Fuller, PA-C      . ondansetron (ZOFRAN) injection 4 mg  4 mg Intravenous Q6H PRN [name], MD      . Warfarin - Pharmacist Dosing Inpatient   Does not apply [id number] [name], MD        PE: General appearance: alert, cooperative and no distress Lungs: clear to auscultation bilaterally Heart: irregularly irregular rhythm  and No MM Extremities: No LEE Pulses: 2+ and symmetric Skin: warm and dry.  Right wrist:  no ecchymosis or erythema. Neurologic: Grossly normal  Lab Results:   Recent Labs  10/08/13 0109  WBC 6.6  HGB 12.0  HCT 35.8*  PLT 184   BMET  Recent Labs  10/07/13 0413 10/08/13 0109  NA 144 143  K 3.6* 3.4*  CL 106 105  CO2 29 29  GLUCOSE 95 107*  BUN 21 19  CREATININE 0.94 0.83  CALCIUM 8.3* 8.1*   PT/INR  Recent Labs  10/06/13 1846 10/07/13 0413 10/08/13 0109  LABPROT 21.3* 20.8* 16.9*  INR 1.84* 1.79* 1.37      Assessment/Plan  Principal Problem:   Unstable angina Active Problems:   CAD (coronary artery disease)   HTN (hypertension)   Hyperlipidemia   Atrial fibrillation   Combined systolic and diastolic heart failure, NYHA class 3   Hypokalemia  Plan:   S/P cutting balloon angioplasty and Promus DES to the mid LAD.  Pantent prox LAD stent and circ stent. Chronically  occluded OM1.  EF 55%.  Coumadin restarted.  ASA, plavix, lipitor, diliazem 240, lasix 40, imdur 90, lopressor 37.5bid.  DC ASA when INR therapeutic. BP and HR stable.  We will recheck INR on Tuesday.  Resume home dose coumadin.  ambulate with CR this morning and DC home.   Replace K this morning.   LOS: 4 days    HAGER, BRYAN PA-C 10/08/2013 7:44 AM  Pt stabvle for discharge  Question answered

## 2013-10-11 ENCOUNTER — Ambulatory Visit (INDEPENDENT_AMBULATORY_CARE_PROVIDER_SITE_OTHER): Payer: Medicare Other | Admitting: *Deleted

## 2013-10-11 DIAGNOSIS — Z7901 Long term (current) use of anticoagulants: Secondary | ICD-10-CM

## 2013-10-11 DIAGNOSIS — I4891 Unspecified atrial fibrillation: Secondary | ICD-10-CM

## 2013-10-11 LAB — POCT INR: INR: 1.7

## 2013-10-11 LAB — POCT ACTIVATED CLOTTING TIME
Activated Clotting Time: 287 seconds
Activated Clotting Time: 309 seconds

## 2013-10-14 ENCOUNTER — Other Ambulatory Visit: Payer: Self-pay | Admitting: *Deleted

## 2013-10-14 MED ORDER — WARFARIN SODIUM 2 MG PO TABS: ORAL_TABLET | ORAL | Status: DC

## 2013-10-20 ENCOUNTER — Ambulatory Visit (INDEPENDENT_AMBULATORY_CARE_PROVIDER_SITE_OTHER): Payer: Medicare Other | Admitting: Pharmacist

## 2013-10-20 DIAGNOSIS — I4891 Unspecified atrial fibrillation: Secondary | ICD-10-CM | POA: Diagnosis not present

## 2013-10-20 LAB — POCT INR: INR: 2.5

## 2013-10-25 ENCOUNTER — Encounter: Payer: Self-pay | Admitting: Internal Medicine

## 2013-10-28 DIAGNOSIS — H26499 Other secondary cataract, unspecified eye: Secondary | ICD-10-CM | POA: Diagnosis not present

## 2013-10-28 DIAGNOSIS — Z961 Presence of intraocular lens: Secondary | ICD-10-CM | POA: Diagnosis not present

## 2013-10-28 DIAGNOSIS — H43819 Vitreous degeneration, unspecified eye: Secondary | ICD-10-CM | POA: Diagnosis not present

## 2013-10-28 DIAGNOSIS — H353190 Nonexudative age-related macular degeneration, unspecified eye, stage unspecified: Secondary | ICD-10-CM | POA: Diagnosis not present

## 2013-11-04 ENCOUNTER — Encounter: Payer: Self-pay | Admitting: Interventional Cardiology

## 2013-11-04 ENCOUNTER — Ambulatory Visit (INDEPENDENT_AMBULATORY_CARE_PROVIDER_SITE_OTHER): Payer: Medicare Other | Admitting: Interventional Cardiology

## 2013-11-04 ENCOUNTER — Ambulatory Visit (INDEPENDENT_AMBULATORY_CARE_PROVIDER_SITE_OTHER): Payer: Medicare Other | Admitting: *Deleted

## 2013-11-04 VITALS — BP 130/55 | HR 59 | Ht 60.0 in | Wt 174.0 lb

## 2013-11-04 DIAGNOSIS — I200 Unstable angina: Secondary | ICD-10-CM | POA: Diagnosis not present

## 2013-11-04 DIAGNOSIS — I482 Chronic atrial fibrillation, unspecified: Secondary | ICD-10-CM

## 2013-11-04 DIAGNOSIS — I2510 Atherosclerotic heart disease of native coronary artery without angina pectoris: Secondary | ICD-10-CM

## 2013-11-04 DIAGNOSIS — I4891 Unspecified atrial fibrillation: Secondary | ICD-10-CM

## 2013-11-04 LAB — POCT INR: INR: 2.2

## 2013-11-04 MED ORDER — LISINOPRIL 5 MG PO TABS: 5.0000 mg | ORAL_TABLET | Freq: Two times a day (BID) | ORAL | Status: DC

## 2013-11-04 NOTE — Patient Instructions (Signed)
Your physician recommends that you continue on your current medications as directed. Please refer to the Current Medication list given to you today.  Your physician wants you to follow-up in: 3 months with Dr. Irish Lack. You will receive a reminder letter in the mail two months in advance. If you don't receive a letter, please call our office to schedule the follow-up appointment.

## 2013-11-04 NOTE — Progress Notes (Signed)
Patient ID: Patty [name], female   DOB: [birth year], 80 y.o.   MRN: [id number]    [address], [address] [place], [place]  [address] Phone: [phone] Fax:  [phone]  Date:  11/04/2013   ID:  Patty [name], DOB 07/07/1930, MRN [id number]  PCP:  Horton Finer, MD      History of Present Illness: Patty [name] is a 80 y.o. female with AFib and CAD. She had multivessel rotational atherectomy and stent placement in 7/14. She finished rehab without any problems.  She had a DES to an area of ISR from an old Bare metal stent from 2005. CAD/ASCVD:   Denies :   Diaphoresis.  Diet.  Dizziness.  Dyspnea on exertion.  Exercise.  Fatigue.  Leg edema.  Orthopnea.  Palpitations.   She took one NTG after her husband was in a car accident.  She is feeling well.  She is walking regularly.  Weight is increased a little bit after being out of town.  Wt Readings from Last 3 Encounters:  11/04/13 174 lb (78.926 kg)  10/08/13 174 lb 13.2 oz (79.3 kg)  10/08/13 174 lb 13.2 oz (79.3 kg)     Past Medical History  Diagnosis Date  . IHD (ischemic heart disease)     Remote MI in 2000 with stent to LAD and LCX. Failed  intervention to OM in 2009. Managed medically since that time.  . Hypertension   . Hyperlipidemia   . Obesity   . Granulosa cell tumor     Has had several surgeries due to her tumor  . Diverticulitis   . Shortness of breath   . Peripheral vascular disease   . Other and unspecified angina pectoris   . MI (myocardial infarction) 06/1998    "when they were putting in the stent" (08/11/2012)  . Varicose veins   . Granulosa cell carcinoma     abd; "last episode was in 2009" (08/11/2012)  . Dysrhythmia   . Atrial flutter   . Tachycardia-bradycardia syndrome     leadless pacemaker ([name]) implanted by Dr [name]  . Atrial fibrillation     "just developed this winter 2013" (08/11/2012)  . CHF (congestive heart failure)   . OSA on CPAP   . Arthritis     "touch in  my fingers & right hip" (11/12/2012)  . Combined systolic and diastolic heart failure, NYHA class 3   . Coronary artery disease   . Pacemaker   . LBBB (left bundle branch block)     chronic    Current Outpatient Prescriptions  Medication Sig Dispense Refill  . atorvastatin (LIPITOR) 80 MG tablet Take 1 tablet (80 mg total) by mouth every morning.  30 tablet  5  . Calcium Carb-Cholecalciferol (CALTRATE 600+D) 600-800 MG-UNIT TABS Take 1 tablet by mouth every morning.       . clopidogrel (PLAVIX) 75 MG tablet Take 1 tablet (75 mg total) by mouth daily with breakfast.  30 tablet  3  . Coenzyme Q-10 100 MG capsule Take 100 mg by mouth daily.       Marland Kitchen diltiazem (CARDIZEM CD) 240 MG 24 hr capsule Take 240 mg by mouth daily.      . furosemide (LASIX) 40 MG tablet Take 40-80 mg by mouth 2 (two) times daily. Take 80mg  every morning and 40mg  every evening      . isosorbide mononitrate (IMDUR) 60 MG 24 hr tablet Take 0.5-1 tablets (30-60 mg total) by mouth 2 (  two) times daily. 60mg  every morning and 30mg  every evening  45 tablet  1  . lisinopril (PRINIVIL,ZESTRIL) 10 MG tablet Take 0.5 tablets (5 mg total) by mouth 2 (two) times daily.  90 tablet  2  . metoprolol tartrate (LOPRESSOR) 25 MG tablet Take 1.5 tablets (37.5 mg total) by mouth 2 (two) times daily.  90 tablet  5  . Multiple Vitamin (MULTIVITAMIN WITH MINERALS) TABS tablet Take 1 tablet by mouth every morning.      . Multiple Vitamins-Minerals (OCUVITE PRESERVISION) TABS Take 1 tablet by mouth daily.       . nitroGLYCERIN (NITROSTAT) 0.4 MG SL tablet Place 0.4 mg under the tongue every 5 (five) minutes as needed for chest pain.      . Omega-3 Fatty Acids (FISH OIL PO) Take 1,200 mg by mouth daily.       . potassium chloride SA (K-DUR,KLOR-CON) 20 MEQ tablet Take 3 tablets (60 mEq total) by mouth daily.  90 tablet  3  . warfarin (COUMADIN) 2 MG tablet Take as directed by Coumadin clinic  30 tablet  3   No current facility-administered  medications for this visit.    Allergies:    Allergies  Allergen Reactions  . Bee Venom Anaphylaxis  . Amoxicillin Diarrhea and Nausea And Vomiting  . Other     Pain medications cause severe vomiting.  . Adhesive [Tape] Rash    Social History:  The patient  reports that she quit smoking about 39 years ago. Her smoking use included Cigarettes. She has a 32 pack-year smoking history. She has never used smokeless tobacco. She reports that she drinks about 3 ounces of alcohol per week. She reports that she does not use illicit drugs.   Family History:  The patient's family history includes Diabetes in her brother and father; Heart attack in her brother and father; Hypertension in her brother and father; Kidney failure in her brother; Stroke in her mother.   ROS:  Please see the history of present illness.  No nausea, vomiting.  No fevers, chills.  No focal weakness.  No dysuria. Atypical chest discomfort as noted above.   All other systems reviewed and negative.   PHYSICAL EXAM: VS:  BP 130/55  Pulse 59  Ht 5' (1.524 m)  Wt 174 lb (78.926 kg)  BMI 33.98 kg/m2  SpO2 95% Well nourished, well developed, in no acute distress HEENT: normal Neck: no JVD, no carotid bruits Cardiac:  normal S1, S2; irregularly irregular, normal rate Lungs:  clear to auscultation bilaterally, no wheezing, rhonchi or rales Abd: soft, nontender, no hepatomegaly Ext: no edema Skin: warm and dry Neuro:   no focal abnormalities noted      ASSESSMENT AND PLAN:  CAD (coronary artery disease)  Continue Plavix Tablet, 75 MG, 1 tablet, Orally, Once a day Continue Atorvastatin Calcium Tablet, 80 MG, 1 tablet, Orally, Once a day Notes: No angina. s/p DES to the LAD and circumflex. No aspirin due to plavix and coumadin. OK for cardiac rehab.  LDL 54 in 4/15. Continue statin.   2. Essential hypertension, benign  Notes: Controlled.     3. Atrial fibrillation  Continue Cardizem CD Capsule Extended Release 24  Hour, 240 MG, 1 capsule, Orally, Once a day - patient stopped 07/12/12 on her own but we restarted it. Notes: Rate controlled. Coumadin for stroke prevention.  INR checked today, 2.2 today. Coumadin for stroke prevention.    4. Cardiomyopathy  Notes:  EF 40% in 10/14.  By  cath, EF 55 % by cath in 9/15.   5. Ankle edema  Notes: Left leg edema. Doubt DVT given that she is on coumadin. Continue to monitor.    Preventive Medicine  Adult topics discussed:  Diet: healthy diet, low calorie, low fat.  Exercise: 5 days a week, at least 30 minutes of aerobic exercise.      Signed, Mina Marble, MD, [facility] 11/04/2013 2:57 PM

## 2013-11-05 DIAGNOSIS — Z23 Encounter for immunization: Secondary | ICD-10-CM | POA: Diagnosis not present

## 2013-11-23 ENCOUNTER — Other Ambulatory Visit: Payer: Self-pay | Admitting: Internal Medicine

## 2013-11-23 ENCOUNTER — Ambulatory Visit
Admission: RE | Admit: 2013-11-23 | Discharge: 2013-11-23 | Disposition: A | Discharge status: Home / Self Care | Payer: Medicare Other | Source: Ambulatory Visit | Attending: Internal Medicine | Admitting: Internal Medicine

## 2013-11-23 DIAGNOSIS — M8580 Other specified disorders of bone density and structure, unspecified site: Secondary | ICD-10-CM | POA: Diagnosis not present

## 2013-11-23 DIAGNOSIS — M25551 Pain in right hip: Secondary | ICD-10-CM

## 2013-11-28 ENCOUNTER — Telehealth: Payer: Self-pay | Admitting: Interventional Cardiology

## 2013-11-28 NOTE — Telephone Encounter (Signed)
Called to advise [place] that on Sat 10/24 she felt what she describes as pressure/tightness in chest.  Took one NTG and was relieved within the hour.  BP was 204/130 and about hour later BP was 121/78 109. Also had some chest pressure on Sun 10/25 and took one NTG.  Was 1 1/2 hrs later before she had relief.  BP 194/121 10 then 1 hr later was 117/73 102. Again this morning she had some pressure at 6 am; took 1 NTG and still at 7 was having pressure and took another NTG and by 8 was pain free. Has not taken BP this AM. She is pain free now.  States also that she has been having some pain in her hip and was seen by her PCP last week.  Has been taking Tylenol for the pain.  She just wanted to make sure Dr. Irish Lack is aware.  She is very knowledgeable about her health.  She will call 911 if she has to take 3 NTG.

## 2013-11-28 NOTE — Telephone Encounter (Signed)
New message     Pt took 2 nitro on Sunday morning and 2 this morning.  She has been having tightness in her chest.  She said this is not anything new and Dr V is aware of this.  He wanted to know when she takes 2 nitro.  Right now pt is symptom free.

## 2013-11-28 NOTE — Telephone Encounter (Signed)
lmtcb

## 2013-11-30 NOTE — Telephone Encounter (Signed)
I spoke with the patient. She is aware of Dr. Hassell Done recommendations. She states her BP's have been better since Tuesday. She will continue to follow and call us with any changes.

## 2013-11-30 NOTE — Telephone Encounter (Signed)
Seems like CP occurs when her BP is high.  Prn NTG ok for BP spikes.  Could also try extra lisinopril 5 mg if BP is over 160/90

## 2013-12-01 ENCOUNTER — Ambulatory Visit (INDEPENDENT_AMBULATORY_CARE_PROVIDER_SITE_OTHER): Payer: Medicare Other | Admitting: Pharmacist Clinician (PhC)/ Clinical Pharmacy Specialist

## 2013-12-01 DIAGNOSIS — I4891 Unspecified atrial fibrillation: Secondary | ICD-10-CM

## 2013-12-01 LAB — POCT INR: INR: 1.7

## 2013-12-01 IMAGING — CR DG CHEST 2V
2 series · 2 of 2 positions shown · non-contrast
Comparison: 03/17/2012

CLINICAL DATA: Chest pain

EXAM:
CHEST  2 VIEW

[w chest pa]
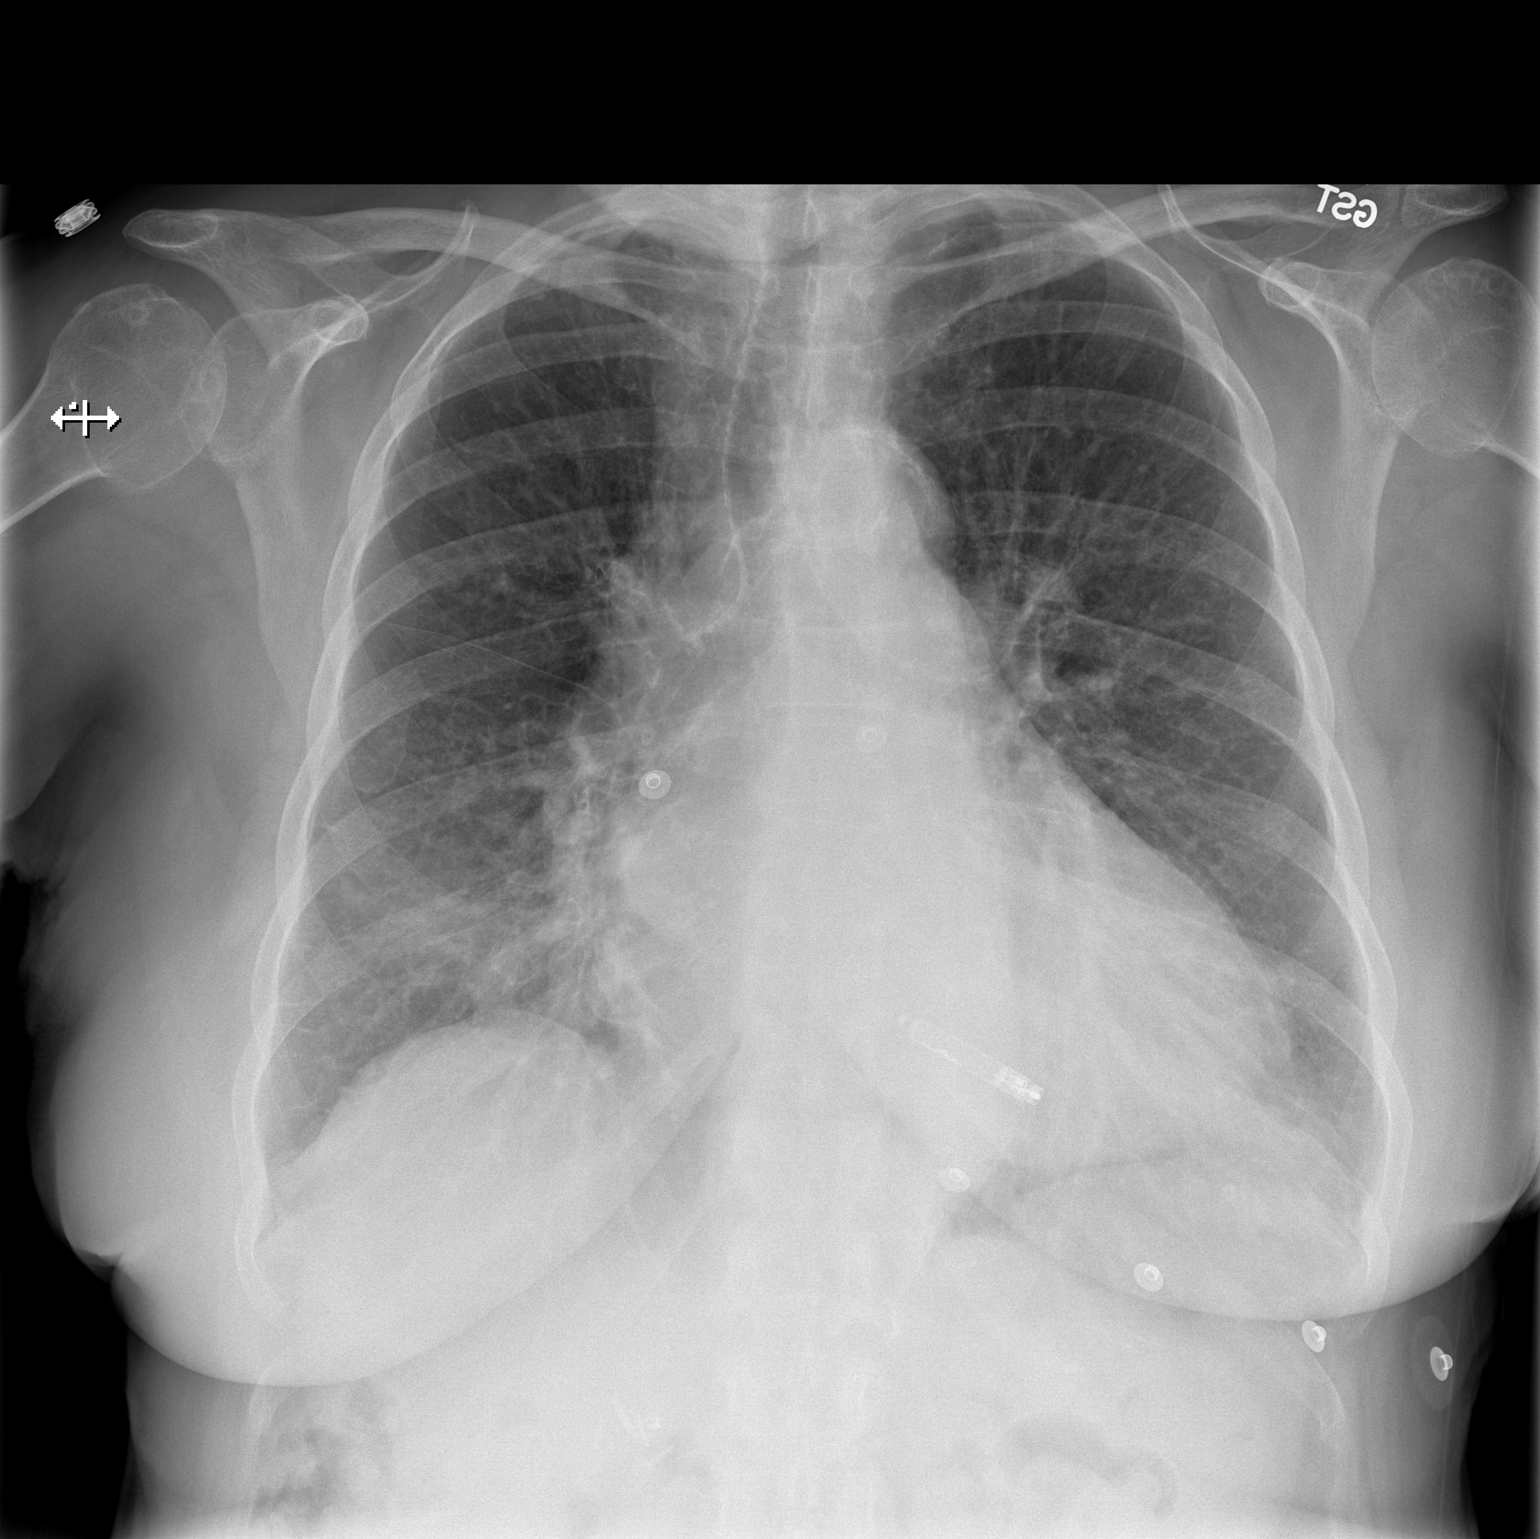

[w chest lat]
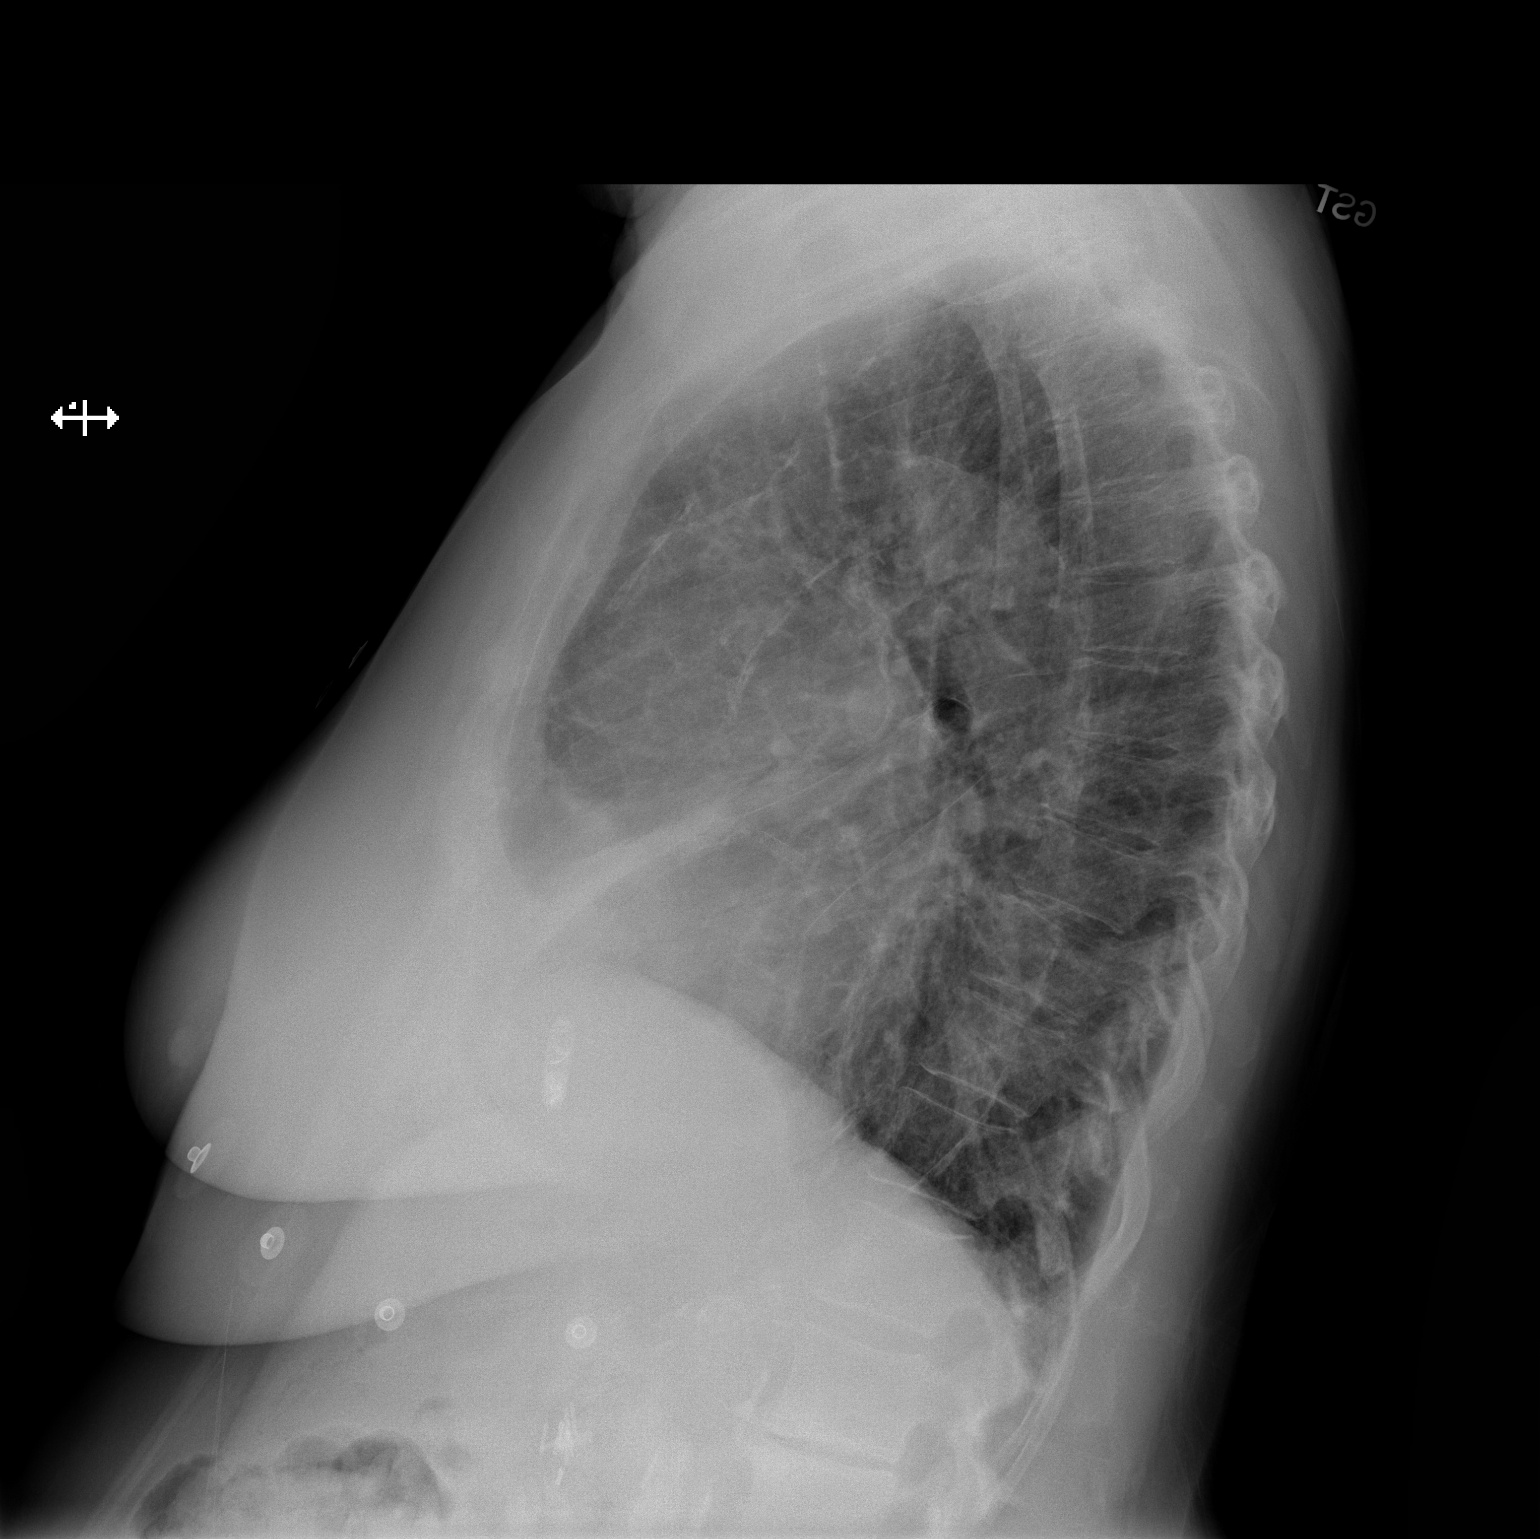

[2 of 2 positions shown; findings below may reference images not displayed]

FINDINGS: Chronic cardiopericardial enlargement. An implantable device is in
stable position. There are Kerley B-lines newly noted. There are
lower lung opacities which appear linear in the lateral projection.
Coronary artery stents present.
IMPRESSION: 1. Mild pulmonary edema.
2. Lower lung atelectasis.

## 2013-12-05 ENCOUNTER — Other Ambulatory Visit: Payer: Self-pay | Admitting: Interventional Cardiology

## 2013-12-07 ENCOUNTER — Other Ambulatory Visit: Payer: Self-pay | Admitting: *Deleted

## 2013-12-07 MED ORDER — FUROSEMIDE 40 MG PO TABS: ORAL_TABLET | ORAL | Status: DC

## 2013-12-08 ENCOUNTER — Other Ambulatory Visit: Payer: Self-pay | Admitting: Interventional Cardiology

## 2013-12-08 MED ORDER — ISOSORBIDE MONONITRATE ER 60 MG PO TB24: 30.0000 mg | ORAL_TABLET | Freq: Two times a day (BID) | ORAL | Status: DC

## 2013-12-11 ENCOUNTER — Telehealth: Payer: Self-pay | Admitting: Physician Assistant

## 2013-12-11 ENCOUNTER — Inpatient Hospital Stay (HOSPITAL_COMMUNITY)
Admission: EM | Admit: 2013-12-11 | Discharge: 2013-12-15 | DRG: 287 | Disposition: A | Discharge status: Home / Self Care | Payer: Medicare Other | Attending: Interventional Cardiology | Admitting: Interventional Cardiology

## 2013-12-11 ENCOUNTER — Encounter (HOSPITAL_COMMUNITY): Payer: Self-pay | Admitting: Emergency Medicine

## 2013-12-11 ENCOUNTER — Emergency Department (HOSPITAL_COMMUNITY): Payer: Medicare Other

## 2013-12-11 DIAGNOSIS — E785 Hyperlipidemia, unspecified: Secondary | ICD-10-CM | POA: Diagnosis present

## 2013-12-11 DIAGNOSIS — G4733 Obstructive sleep apnea (adult) (pediatric): Secondary | ICD-10-CM | POA: Diagnosis present

## 2013-12-11 DIAGNOSIS — Z833 Family history of diabetes mellitus: Secondary | ICD-10-CM

## 2013-12-11 DIAGNOSIS — I200 Unstable angina: Secondary | ICD-10-CM | POA: Diagnosis not present

## 2013-12-11 DIAGNOSIS — I25110 Atherosclerotic heart disease of native coronary artery with unstable angina pectoris: Secondary | ICD-10-CM

## 2013-12-11 DIAGNOSIS — I495 Sick sinus syndrome: Secondary | ICD-10-CM | POA: Diagnosis present

## 2013-12-11 DIAGNOSIS — I248 Other forms of acute ischemic heart disease: Secondary | ICD-10-CM | POA: Diagnosis present

## 2013-12-11 DIAGNOSIS — Z950 Presence of cardiac pacemaker: Secondary | ICD-10-CM | POA: Diagnosis present

## 2013-12-11 DIAGNOSIS — Z955 Presence of coronary angioplasty implant and graft: Secondary | ICD-10-CM | POA: Diagnosis not present

## 2013-12-11 DIAGNOSIS — I4891 Unspecified atrial fibrillation: Secondary | ICD-10-CM | POA: Diagnosis present

## 2013-12-11 DIAGNOSIS — Z8249 Family history of ischemic heart disease and other diseases of the circulatory system: Secondary | ICD-10-CM | POA: Diagnosis not present

## 2013-12-11 DIAGNOSIS — R079 Chest pain, unspecified: Secondary | ICD-10-CM

## 2013-12-11 DIAGNOSIS — Z7902 Long term (current) use of antithrombotics/antiplatelets: Secondary | ICD-10-CM | POA: Diagnosis not present

## 2013-12-11 DIAGNOSIS — Z7901 Long term (current) use of anticoagulants: Secondary | ICD-10-CM | POA: Diagnosis not present

## 2013-12-11 DIAGNOSIS — I8390 Asymptomatic varicose veins of unspecified lower extremity: Secondary | ICD-10-CM | POA: Diagnosis present

## 2013-12-11 DIAGNOSIS — E876 Hypokalemia: Secondary | ICD-10-CM | POA: Diagnosis present

## 2013-12-11 DIAGNOSIS — Z452 Encounter for adjustment and management of vascular access device: Secondary | ICD-10-CM | POA: Diagnosis not present

## 2013-12-11 DIAGNOSIS — I252 Old myocardial infarction: Secondary | ICD-10-CM | POA: Diagnosis not present

## 2013-12-11 DIAGNOSIS — E669 Obesity, unspecified: Secondary | ICD-10-CM | POA: Diagnosis present

## 2013-12-11 DIAGNOSIS — I739 Peripheral vascular disease, unspecified: Secondary | ICD-10-CM | POA: Diagnosis present

## 2013-12-11 DIAGNOSIS — I209 Angina pectoris, unspecified: Secondary | ICD-10-CM

## 2013-12-11 DIAGNOSIS — I10 Essential (primary) hypertension: Secondary | ICD-10-CM | POA: Diagnosis present

## 2013-12-11 DIAGNOSIS — I482 Chronic atrial fibrillation: Secondary | ICD-10-CM | POA: Diagnosis not present

## 2013-12-11 DIAGNOSIS — Z823 Family history of stroke: Secondary | ICD-10-CM | POA: Diagnosis not present

## 2013-12-11 DIAGNOSIS — I2510 Atherosclerotic heart disease of native coronary artery without angina pectoris: Secondary | ICD-10-CM | POA: Diagnosis present

## 2013-12-11 DIAGNOSIS — I25118 Atherosclerotic heart disease of native coronary artery with other forms of angina pectoris: Secondary | ICD-10-CM | POA: Diagnosis not present

## 2013-12-11 DIAGNOSIS — I517 Cardiomegaly: Secondary | ICD-10-CM | POA: Diagnosis not present

## 2013-12-11 DIAGNOSIS — D3910 Neoplasm of uncertain behavior of unspecified ovary: Secondary | ICD-10-CM | POA: Diagnosis present

## 2013-12-11 DIAGNOSIS — Z79899 Other long term (current) drug therapy: Secondary | ICD-10-CM | POA: Diagnosis not present

## 2013-12-11 DIAGNOSIS — I2582 Chronic total occlusion of coronary artery: Secondary | ICD-10-CM | POA: Diagnosis present

## 2013-12-11 DIAGNOSIS — Z6833 Body mass index (BMI) 33.0-33.9, adult: Secondary | ICD-10-CM | POA: Diagnosis not present

## 2013-12-11 DIAGNOSIS — Z87891 Personal history of nicotine dependence: Secondary | ICD-10-CM | POA: Diagnosis not present

## 2013-12-11 DIAGNOSIS — E66811 Obesity, class 1: Secondary | ICD-10-CM | POA: Diagnosis present

## 2013-12-11 LAB — CBC WITH DIFFERENTIAL/PLATELET
BASOS ABS: 0 10*3/uL (ref 0.0–0.1)
Basophils Relative: 0 % (ref 0–1)
Eosinophils Absolute: 0.2 10*3/uL (ref 0.0–0.7)
Eosinophils Relative: 3 % (ref 0–5)
HCT: 36.4 % (ref 36.0–46.0)
Hemoglobin: 12 g/dL (ref 12.0–15.0)
Lymphocytes Relative: 14 % (ref 12–46)
Lymphs Abs: 1 10*3/uL (ref 0.7–4.0)
MCH: 30.5 pg (ref 26.0–34.0)
MCHC: 33 g/dL (ref 30.0–36.0)
MCV: 92.6 fL (ref 78.0–100.0)
Monocytes Absolute: 0.5 10*3/uL (ref 0.1–1.0)
Monocytes Relative: 6 % (ref 3–12)
Neutro Abs: 5.5 10*3/uL (ref 1.7–7.7)
Neutrophils Relative %: 77 % (ref 43–77)
Platelets: 332 10*3/uL (ref 150–400)
RBC: 3.93 MIL/uL (ref 3.87–5.11)
RDW: 14.1 % (ref 11.5–15.5)
WBC: 7.1 10*3/uL (ref 4.0–10.5)

## 2013-12-11 LAB — COMPREHENSIVE METABOLIC PANEL
ALBUMIN: 3.1 g/dL — AB (ref 3.5–5.2)
ALK PHOS: 134 U/L — AB (ref 39–117)
ALT: 30 U/L (ref 0–35)
AST: 38 U/L — AB (ref 0–37)
Anion gap: 11 (ref 5–15)
BILIRUBIN TOTAL: 0.6 mg/dL (ref 0.3–1.2)
BUN: 20 mg/dL (ref 6–23)
CHLORIDE: 103 meq/L (ref 96–112)
CO2: 29 mEq/L (ref 19–32)
Calcium: 8.8 mg/dL (ref 8.4–10.5)
Creatinine, Ser: 0.86 mg/dL (ref 0.50–1.10)
GFR calc Af Amer: 71 mL/min — ABNORMAL LOW (ref >90)
GFR calc non Af Amer: 62 mL/min — ABNORMAL LOW (ref >90)
Glucose, Bld: 111 mg/dL — ABNORMAL HIGH (ref 70–99)
POTASSIUM: 3.7 meq/L (ref 3.7–5.3)
Sodium: 143 mEq/L (ref 137–147)
Total Protein: 6.7 g/dL (ref 6.0–8.3)

## 2013-12-11 LAB — PROTIME-INR
INR: 2.23 — AB (ref 0.00–1.49)
PROTHROMBIN TIME: 24.9 s — AB (ref 11.6–15.2)

## 2013-12-11 LAB — URINALYSIS, ROUTINE W REFLEX MICROSCOPIC
BILIRUBIN URINE: NEGATIVE
Glucose, UA: NEGATIVE mg/dL
Hgb urine dipstick: NEGATIVE
Ketones, ur: NEGATIVE mg/dL
Leukocytes, UA: NEGATIVE
Nitrite: NEGATIVE
PH: 6.5 (ref 5.0–8.0)
Protein, ur: NEGATIVE mg/dL
Specific Gravity, Urine: 1.007 (ref 1.005–1.030)
UROBILINOGEN UA: 0.2 mg/dL (ref 0.0–1.0)

## 2013-12-11 LAB — TROPONIN I: Troponin I: <0.3 ng/mL (ref <0.30)

## 2013-12-11 LAB — I-STAT TROPONIN, ED: Troponin i, poc: 0.05 ng/mL (ref 0.00–0.08)

## 2013-12-11 LAB — PRO B NATRIURETIC PEPTIDE: Pro B Natriuretic peptide (BNP): 1383 pg/mL — ABNORMAL HIGH (ref 0–450)

## 2013-12-11 MED ORDER — ATORVASTATIN CALCIUM 80 MG PO TABS
80.0000 mg | ORAL_TABLET | Freq: Every morning | ORAL | Status: DC
  Administered 2013-12-12 – 2013-12-15 (×4): 80 mg via ORAL
  Filled 2013-12-11 (×4): qty 1

## 2013-12-11 MED ORDER — ISOSORBIDE MONONITRATE ER 60 MG PO TB24
60.0000 mg | ORAL_TABLET | Freq: Two times a day (BID) | ORAL | Status: DC
  Administered 2013-12-11 – 2013-12-15 (×8): 60 mg via ORAL
  Filled 2013-12-11 (×8): qty 1

## 2013-12-11 MED ORDER — CLOPIDOGREL BISULFATE 75 MG PO TABS
75.0000 mg | ORAL_TABLET | Freq: Every day | ORAL | Status: DC
  Administered 2013-12-12 – 2013-12-15 (×4): 75 mg via ORAL
  Filled 2013-12-11 (×4): qty 1

## 2013-12-11 MED ORDER — LISINOPRIL 5 MG PO TABS
15.0000 mg | ORAL_TABLET | Freq: Every day | ORAL | Status: DC
  Administered 2013-12-11: 15 mg via ORAL
  Filled 2013-12-11 (×2): qty 1

## 2013-12-11 MED ORDER — ALPRAZOLAM 0.25 MG PO TABS: 0.2500 mg | ORAL_TABLET | Freq: Two times a day (BID) | ORAL | Status: DC | PRN

## 2013-12-11 MED ORDER — OCUVITE PRESERVISION PO TABS: 1.0000 | ORAL_TABLET | Freq: Every day | ORAL | Status: DC

## 2013-12-11 MED ORDER — CALCIUM CARB-CHOLECALCIFEROL 600-800 MG-UNIT PO TABS: 1.0000 | ORAL_TABLET | Freq: Every morning | ORAL | Status: DC

## 2013-12-11 MED ORDER — COENZYME Q-10 100 MG PO CAPS: 100.0000 mg | ORAL_CAPSULE | Freq: Every day | ORAL | Status: DC

## 2013-12-11 MED ORDER — ADULT MULTIVITAMIN W/MINERALS CH: 1.0000 | ORAL_TABLET | Freq: Every morning | ORAL | Status: DC

## 2013-12-11 MED ORDER — OCUVITE-LUTEIN PO CAPS
1.0000 | ORAL_CAPSULE | Freq: Every day | ORAL | Status: DC
  Administered 2013-12-12 – 2013-12-15 (×4): 1 via ORAL
  Filled 2013-12-11 (×4): qty 1

## 2013-12-11 MED ORDER — ONDANSETRON HCL 4 MG/2ML IJ SOLN: 4.0000 mg | Freq: Four times a day (QID) | INTRAMUSCULAR | Status: DC | PRN

## 2013-12-11 MED ORDER — SODIUM CHLORIDE 0.9 % IV SOLN: 250.0000 mL | INTRAVENOUS | Status: DC | PRN

## 2013-12-11 MED ORDER — ACETAMINOPHEN 500 MG PO TABS
500.0000 mg | ORAL_TABLET | Freq: Every day | ORAL | Status: DC
  Administered 2013-12-11 – 2013-12-14 (×4): 500 mg via ORAL
  Filled 2013-12-11 (×4): qty 1

## 2013-12-11 MED ORDER — POTASSIUM CHLORIDE CRYS ER 20 MEQ PO TBCR
20.0000 meq | EXTENDED_RELEASE_TABLET | Freq: Two times a day (BID) | ORAL | Status: DC
  Administered 2013-12-11 – 2013-12-15 (×8): 20 meq via ORAL
  Filled 2013-12-11 (×5): qty 1
  Filled 2013-12-11: qty 2
  Filled 2013-12-11 (×2): qty 1

## 2013-12-11 MED ORDER — CALCIUM CARBONATE-VITAMIN D 500-200 MG-UNIT PO TABS
1.0000 | ORAL_TABLET | Freq: Every day | ORAL | Status: DC
  Administered 2013-12-12 – 2013-12-15 (×4): 1 via ORAL
  Filled 2013-12-11 (×4): qty 1

## 2013-12-11 MED ORDER — SODIUM CHLORIDE 0.9 % IJ SOLN: 3.0000 mL | INTRAMUSCULAR | Status: DC | PRN

## 2013-12-11 MED ORDER — DILTIAZEM LOAD VIA INFUSION
10.0000 mg | Freq: Once | INTRAVENOUS | Status: AC
  Administered 2013-12-11: 10 mg via INTRAVENOUS
  Filled 2013-12-11: qty 10

## 2013-12-11 MED ORDER — METOPROLOL TARTRATE 50 MG PO TABS
50.0000 mg | ORAL_TABLET | Freq: Two times a day (BID) | ORAL | Status: DC
  Administered 2013-12-11 – 2013-12-14 (×6): 50 mg via ORAL
  Filled 2013-12-11 (×6): qty 1

## 2013-12-11 MED ORDER — NITROGLYCERIN 0.4 MG SL SUBL
0.4000 mg | SUBLINGUAL_TABLET | SUBLINGUAL | Status: DC | PRN
  Administered 2013-12-12: 0.4 mg via SUBLINGUAL
  Filled 2013-12-11: qty 1

## 2013-12-11 MED ORDER — ACETAMINOPHEN 325 MG PO TABS: 650.0000 mg | ORAL_TABLET | ORAL | Status: DC | PRN

## 2013-12-11 MED ORDER — ZOLPIDEM TARTRATE 5 MG PO TABS: 5.0000 mg | ORAL_TABLET | Freq: Every evening | ORAL | Status: DC | PRN

## 2013-12-11 MED ORDER — OMEGA-3-ACID ETHYL ESTERS 1 G PO CAPS
1.0000 g | ORAL_CAPSULE | Freq: Every day | ORAL | Status: DC
  Administered 2013-12-11 – 2013-12-15 (×5): 1 g via ORAL
  Filled 2013-12-11 (×5): qty 1

## 2013-12-11 MED ORDER — DILTIAZEM HCL 100 MG IV SOLR
5.0000 mg/h | INTRAVENOUS | Status: DC
  Administered 2013-12-11: 5 mg/h via INTRAVENOUS

## 2013-12-11 MED ORDER — FUROSEMIDE 40 MG PO TABS
40.0000 mg | ORAL_TABLET | Freq: Two times a day (BID) | ORAL | Status: DC
  Administered 2013-12-11 – 2013-12-15 (×8): 40 mg via ORAL
  Filled 2013-12-11 (×8): qty 1

## 2013-12-11 MED ORDER — SODIUM CHLORIDE 0.9 % IJ SOLN
3.0000 mL | Freq: Two times a day (BID) | INTRAMUSCULAR | Status: DC
  Administered 2013-12-12 – 2013-12-13 (×4): 3 mL via INTRAVENOUS

## 2013-12-11 NOTE — H&P (Signed)
History and Physical   Patient ID: [name] MRN: [id number], DOB/AGE: 78/26/1932 78 y.o. Date of Encounter: 12/11/2013  Primary Physician: Horton Finer, MD Primary Cardiologist: Dr. Irish Lack  Chief Complaint:  Chest pain  HPI: Patty [name] is a 80 y.o. female with a history of CAD, HTN, HLD, PVD and atrial fibrillation on Coumadin.  She began having increasing right hip pain secondary to arthritis. She contacted her family physician and has an appointment to see an orthopedist but has not been yet. Over the last 10 days, she has noticed an increase in her heart rate. Her heart rate is frequently greater than 100 when she checks it and over the last few days has been greater than 110, today her heart rate was 130.  Her blood pressure has also been high and and she is tolerating an increased dose of lisinopril, now taking 15 mg daily, with improved blood pressure control.   She has been having more chest pain in usual. Her angina is a chest pressure that at its worst was a 5 or 6/10. She has had increasing dyspnea on exertion and increasing fatigue as well. She has taken nitroglycerin for the pain which has helped. She has taken 2 nitroglycerin tablets most days and thinks she has taken 10 tablets in the past week. The chest pain normally occurs in the morning either before or just after she takes her usual morning medications. She has not had afternoon chest pain. The chest pain does not require a great deal of exertion, but it does not generally start at rest. She had one episode of nausea and vomiting this week shortly after taking her medicine. She has not had any other episodes of nausea and vomiting and no diarrhea.  Today, she developed substernal chest pain, her usual angina. It reached a 6/10 which is worse than usual. She was short of breath with this. She took sublingual nitroglycerin at home but the pain was not relieved. She noted her heart rate to be higher  than usual as well. She came to the emergency room and is currently on Cardizem IV at 5 mg per hour. Her chest pain and shortness of breath have resolved and she is currently resting comfortably.   Past Medical History  Diagnosis Date  . IHD (ischemic heart disease)     Remote MI in 2000 with stent to LAD and LCX. Failed  intervention to OM in 2009. Managed medically since that time.  . Hypertension   . Hyperlipidemia   . Obesity   . Granulosa cell tumor     Has had several surgeries due to her tumor  . Diverticulitis   . Shortness of breath   . Peripheral vascular disease   . Other and unspecified angina pectoris   . MI (myocardial infarction) 06/1998  . Varicose veins   . Granulosa cell carcinoma     abd; last episode was in 2009  . Dysrhythmia   . Atrial flutter   . Tachycardia-bradycardia syndrome     leadless pacemaker ([name]) implanted by Dr [name]  . Atrial fibrillation 2013  . CHF (congestive heart failure)   . OSA on CPAP   . Arthritis     "touch in my fingers & right hip"   . Combined systolic and diastolic heart failure, NYHA class 3   . Coronary artery disease   . Pacemaker   . LBBB (left bundle branch block)     chronic  Surgical History:  Past Surgical History  Procedure Laterality Date  . Cholecystectomy  1980's  . Salivary gland surgery  2000's    "had a little lump removed; granulosa related; it was benign" (08/11/2012)  . US echocardiography  11/21/2003    EF 55-60%  . Cardiovascular stress test  03/23/2009    EF 72%  . Abdominal hysterectomy    . Colon surgery  2004    colectomy for diverticulosis  . Granulosa tumor excision  2000; 2003; 2004; 2007    "all in my abdomen including small intestines, outside my ?uterus/etc" (08/11/2012)  . Diagnostic laparoscopy      gallbladder removal; abdominal hernia repair  . Tee without cardioversion  12/31/2011    Procedure: TRANSESOPHAGEAL ECHOCARDIOGRAM (TEE);  Surgeon: [name], MD;  Location:  [facility];  Service: Cardiovascular;  Laterality: N/A;  . Cardioversion  12/31/2011    Procedure: CARDIOVERSION;  Surgeon: [name], MD;  Location: [facility];  Service: Cardiovascular;  Laterality: N/A;  . Pacemaker insertion  2/11/4    [name] (SJM) leadless pacemaker (LEADLESS II STUDY PATEINT)  . Cardiac catheterization  09/03/2007    EF 70%; Failed attempt at PCI to OM  . Cardiac catheterization  11/01/2003    EF 70%  . Coronary angioplasty    . Coronary angioplasty with stent placement  2000; 08/11/2012; 11/12/2012    3 + 2 LAD & CFX; 2nd CFX stent 11/12/2012  . Insert / replace / remove pacemaker  03/16/2012    [name] (SJM) leadless pacemaker (LEADLESS II STUDY PATEINT)  . Varicose vein surgery Bilateral 1977  . Hernia repair  2005    "laparoscopic"  . Cataract extraction, bilateral  2015     I have reviewed the patient's current medications. Medication Sig  acetaminophen (TYLENOL) 500 MG tablet Take 500 mg by mouth at bedtime.  atorvastatin (LIPITOR) 80 MG tablet Take 1 tablet (80 mg total) by mouth every morning.  Calcium Carb-Cholecalciferol (CALTRATE 600+D) 600-800 MG-UNIT TABS Take 1 tablet by mouth every morning.   clopidogrel (PLAVIX) 75 MG tablet Take 1 tablet (75 mg total) by mouth daily with breakfast.  Coenzyme Q-10 100 MG capsule Take 100 mg by mouth daily.   diltiazem (CARDIZEM CD) 240 MG 24 hr capsule Take 240 mg by mouth daily.  furosemide (LASIX) 40 MG tablet Take 80mg  every morning and 40mg  every evening Patient taking differently: Take 40-80 mg by mouth 2 (two) times daily. Take 80mg  every morning and 40mg  every evening  isosorbide mononitrate (IMDUR) 60 MG 24 hr tablet Take 0.5-1 tablets (30-60 mg total) by mouth 2 (two) times daily. 60mg  every morning and 30mg  every evening  lisinopril (PRINIVIL,ZESTRIL) 5 MG tablet Take 1 tablet (5 mg total) by mouth 2 (two) times daily. Patient taking differently: Take 5-10 mg by mouth 2 (two) times daily.  5mg  in the morning and 10mg  at night  metoprolol tartrate (LOPRESSOR) 25 MG tablet Take 1.5 tablets (37.5 mg total) by mouth 2 (two) times daily.  Multiple Vitamin (MULTIVITAMIN WITH MINERALS) TABS tablet Take 1 tablet by mouth every morning.  Multiple Vitamins-Minerals (OCUVITE PRESERVISION) TABS Take 1 tablet by mouth daily.   nitroGLYCERIN (NITROSTAT) 0.4 MG SL tablet Place 0.4 mg under the tongue every 5 (five) minutes as needed for chest pain.  Omega-3 Fatty Acids (FISH OIL PO) Take 1,200 mg by mouth daily.   potassium chloride SA (K-DUR,KLOR-CON) 20 MEQ tablet Take 3 tablets (60 mEq total) by mouth daily.  warfarin (COUMADIN) 2  MG tablet Take as directed by Coumadin clinic Patient taking differently: Take 1-2 mg by mouth daily. Take 2mg  daily except Tuesday, take 1mg   furosemide (LASIX) 40 MG tablet TAKE 2 TABLETS BY MOUTH EVERY MORNING AND TAKE 1 TABLET EVERY EVENING AS NEEDED   Scheduled Meds:  Continuous Infusions: . diltiazem (CARDIZEM) infusion 5 mg/hr (12/11/13 1326)   Allergies:  Allergies  Allergen Reactions  . Bee Venom Anaphylaxis  . Amoxicillin Diarrhea and Nausea And Vomiting  . Other     Pain medications cause severe vomiting.    History   Social History  . Marital Status: Married    Spouse Name: N/A    Number of Children: N/A  . Years of Education: N/A   Occupational History  . Retired    Social History Main Topics  . Smoking status: Former Smoker -- 1.00 packs/day for 32 years    Types: Cigarettes    Quit date: 02/03/1974  . Smokeless tobacco: Never Used  . Alcohol Use: 3.0 oz/week    5 Glasses of wine per week     Comment: 11/12/2012 "4oz wine w/dinner 5 days/wk"  . Drug Use: No  . Sexual Activity: No   Other Topics Concern  . Not on file   Social History Narrative   Lives with family.    Family History  Problem Relation Age of Onset  . Heart attack Father   . Heart attack Brother   . Stroke Mother   . Diabetes Brother   . Diabetes  Father   . Hypertension Brother   . Hypertension Father   . Kidney failure Brother    Family Status  Relation Status Death Age  . Mother Deceased 23  . Father Deceased 89    Review of Systems:   Full 14-point review of systems otherwise negative except as noted above.  Physical Exam: Blood pressure 110/59, pulse 100, temperature 98 F (36.7 C), resp. rate 19, height 5' (1.524 m), weight 170 lb (77.111 kg), SpO2 97 %. General: Well developed, well nourished,female in no acute distress. Head: Normocephalic, atraumatic, sclera non-icteric, no xanthomas, nares are without discharge. Dentition: good Neck: No carotid bruits. JVD not elevated. No thyromegally Lungs: Good expansion bilaterally. without wheezes or rhonchi.  Heart: IRRegular rate and rhythm with S1 S2.  No S3 or S4.  No murmur, no rubs, or gallops appreciated.  Abdomen: Soft, non-tender, non-distended with normoactive bowel sounds. No hepatomegaly. No rebound/guarding. No obvious abdominal masses. Msk:  Strength and tone appear normal for age. No joint deformities or effusions, no spine or costo-vertebral angle tenderness. Extremities: No clubbing or cyanosis. No edema.  Distal pedal pulses are 2+ in 4 extrem Neuro: Alert and oriented X 3. Moves all extremities spontaneously. No focal deficits noted. Psych:  Responds to questions appropriately with a normal affect. Skin: No rashes or lesions noted  Labs:   Lab Results  Component Value Date   WBC 7.1 12/11/2013   HGB 12.0 12/11/2013   HCT 36.4 12/11/2013   MCV 92.6 12/11/2013   PLT 332 12/11/2013     Recent Labs Lab 12/11/13 1101  NA 143  K 3.7  CL 103  CO2 29  BUN 20  CREATININE 0.86  CALCIUM 8.8  PROT 6.7  BILITOT 0.6  ALKPHOS 134*  ALT 30  AST 38*  GLUCOSE 111*    Recent Labs  12/11/13 1101  TROPONINI <0.30    Recent Labs  12/11/13 1114  TROPIPOC 0.05   Lab Results  Component  Value Date   CHOL 131 10/05/2013   HDL 61 10/05/2013    LDLCALC 51 10/05/2013   TRIG 93 10/05/2013   PRO B NATRIURETIC PEPTIDE (BNP)  Date/Time Value Ref Range Status  12/11/2013 11:01 AM 1383.0* 0 - 450 pg/mL Final  10/04/2013 12:21 PM 1963.0* 0 - 450 pg/mL Final   Radiology/Studies: Dg Chest 2 View 12/11/2013   CLINICAL DATA:  80 year old female with several day history of mid chest pain  EXAM: CHEST  2 VIEW  COMPARISON:  Prior chest x-ray 10/04/2013  FINDINGS: Stable cardiomegaly with left heart prominence. Metallic stent noted along the course of the LAD. Implantable loop recorder projects over the left chest. No focal airspace consolidation, pulmonary edema, pleural effusion or pneumothorax. Surgical clips in the right upper quadrant suggest prior cholecystectomy. Atherosclerotic calcifications throughout the aorta. No acute osseous abnormality.  IMPRESSION: 1. No acute cardiopulmonary disease. 2. Stable cardiomegaly.   Electronically Signed   By: Jacqulynn Cadet M.D.   On: 12/11/2013 12:22   Cardiac Cath: 10/07/2013 ANGIOGRAPHIC DATA: The left main coronary artery is widely patent. The left anterior descending artery is a large vessel which wraps around the apex. There is a moderate lesion in the proximal vessel. There is severe restenosis noted at the proximal portion of the prior stented segment, up to 75% in some views. The left circumflex artery is a large vessel. The proximal stent is widely patent. There is an occluded obtuse marginal one which fills by brisk left to left collaterals. The second obtuse marginal is widely patent with moderate proximal disease. This is a medium size vessel. The remainder of the circumflex is patent. The right coronary artery is a large dominant vessel. There is mild to moderate diffuse atherosclerosis throughout the vessel. In the mid vessel, there is a 40% stenosis. The posterior lateral artery is medium-sized and patent. The posterior descending artery is medium size and widely patent. LEFT VENTRICULOGRAM:  Left ventricular angiogram was done in the 30 RAO projection and revealed normal left ventricular wall motion and systolic function with an estimated ejection fraction of 55 %. LVEDP was 18 mmHg. IMPRESSIONS: 1. Normal left main coronary artery. 2. Restenosis, up to 75% of the old mid left anterior descending artery stent which had been placed in 2001. The more proximal drug-eluting stent from 2014 was patent. The area of mid LAD restenosis was treated with a cutting balloon angioplasty which recoil. Subsequently a 2.75 x 12 Promus drug-eluting stent was deployed and postdilated to 3.3 mm in diameter. 3. Patent proximal left circumflex artery stent. Chronically occluded OM1 with brisk left to left collaterals. This is unchanged from prior angiogram. 4. Mild to moderate diffuse disease in the right coronary artery. 5. Normal left ventricular systolic function. LVEDP 18 mmHg. Ejection fraction 55 %. 6. FFR of the proximal LAD lesion was 0.83 after adenosine. This is not significant. Continue medical therapy. RECOMMENDATION: Restart heparin in several hours. Continue clopidogrel indefinitely. Add aspirin for now. Would stop the aspirin when her INR becomes therapeutic. Restart Coumadin tonight. Continue aggressive secondary prevention. Possible discharge tomorrow if there are no complications.  Echo: TEE 12/31/2011 Study Conclusions - Left ventricle: Systolic function was normal. The estimated ejection fraction was in the range of 50% to 55%. - Aortic valve: Trivial regurgitation. - Mitral valve: Mild regurgitation. - Left atrium: No evidence of thrombus in the atrial cavity or appendage. - Right atrium: No evidence of thrombus in the atrial cavity or appendage. - Atrial septum: No defect or  patent foramen ovale was identified by color Doppler.  ECG: Atrial flutter, 2:1 Conduction  ASSESSMENT AND PLAN:  Principal Problem:   Unstable angina - admit, cycle enzymes, hold  Coumadin and start heparin when INR less than 2.0. If cardiac enzymes elevate, may need her catheterization. Dr. Beau Fanny to review prior films in a.m. And decide.  Active Problems:   HTN (hypertension) - Continue Cardizem drip for now which will help blood pressure as well as heart rate. Increase beta blocker from 37.5 mg twice a day to 50 mg twice a day. This will also help with heart rate control. Follow blood pressure on increased medical therapy and decide if further treatment needed.    Hyperlipidemia - Continue statin    Atrial fibrillation with RVR - Continue IV Cardizem and hold oral Cardizem for now. Increased heart rate may be secondary to pain from arthritis. The elevated heart rate may be a major source of angina.    Chronic anticoagulation - hold Coumadin until a decision on invasive workup is made.  Jonetta Speak, PA-C 12/11/2013 2:52 PM Bee[facility] [phone]    Patient examined chart reviewed. Known CAD with multiple stents Significant SSCP requiring multiple SL nitro No acute ECG changes negative troponin.  Increase beta blocker for rate control.  Has leadless pacemaker for back -up Review of CXR shows it to be in normal position in RV apex.  Hold coumadin and start heparin when INR under 2.0.  Likely cath on Tuesday.  Was done from right radial before and good radial pulse.  Exam remarkable for chronic cough and speech impediment, SEM AV sclerosis and chronic varicose veins and venous insuficiency  Jenkins Rouge

## 2013-12-11 NOTE — Progress Notes (Signed)
Patient's BP 94/37, HR 83 when arrived to unit.  BP rechecked and was 84/39, HR 70 but was asymptomatic. RN turned off cardizem drip and notified Dr Wynonia Lawman. MD ordered to stop cardizem drip.

## 2013-12-11 NOTE — ED Notes (Signed)
Patient transported to X-ray 

## 2013-12-11 NOTE — ED Notes (Signed)
Dr. Gentry at bedside. 

## 2013-12-11 NOTE — ED Notes (Signed)
Cardiology, MD at bedside.

## 2013-12-11 NOTE — ED Notes (Signed)
Patient returned from X-ray 

## 2013-12-11 NOTE — ED Provider Notes (Signed)
CSN: [id number]     Arrival date & time 12/11/13  1047 History   First MD Initiated Contact with Patient 12/11/13 1049     Chief Complaint  Patient presents with  . Chest Pain     (Consider location/radiation/quality/duration/timing/severity/associated sxs/prior Treatment) Patient is a 80 y.o. female presenting with chest pain.  Chest Pain Pain location:  Substernal area Pain quality: pressure   Pain radiates to:  Does not radiate Pain radiates to the back: no   Pain severity:  Moderate Onset quality:  Gradual Duration:  1 hour Timing:  Constant Progression:  Resolved Chronicity:  Chronic Context comment:  Shortly after awakening Relieved by:  Nothing Worsened by:  Nothing tried Associated symptoms: nausea and vomiting   Associated symptoms: no abdominal pain, no anorexia, no back pain and no shortness of breath     Past Medical History  Diagnosis Date  . IHD (ischemic heart disease)     Remote MI in 2000 with stent to LAD and LCX. Failed  intervention to OM in 2009. Managed medically since that time.  . Hypertension   . Hyperlipidemia   . Obesity   . Granulosa cell tumor     Has had several surgeries due to her tumor  . Diverticulitis   . Shortness of breath   . Peripheral vascular disease   . Other and unspecified angina pectoris   . MI (myocardial infarction) 06/1998  . Varicose veins   . Granulosa cell carcinoma     abd; last episode was in 2009  . Dysrhythmia   . Atrial flutter   . Tachycardia-bradycardia syndrome     leadless pacemaker ([name]) implanted by Dr [name]  . Atrial fibrillation 2013  . CHF (congestive heart failure)   . OSA on CPAP   . Arthritis     "touch in my fingers & right hip"   . Combined systolic and diastolic heart failure, NYHA class 3   . Coronary artery disease   . Pacemaker   . LBBB (left bundle branch block)     chronic   Past Surgical History  Procedure Laterality Date  . Cholecystectomy  1980's  . Salivary gland  surgery  2000's    "had a little lump removed; granulosa related; it was benign" (08/11/2012)  . US echocardiography  11/21/2003    EF 55-60%  . Cardiovascular stress test  03/23/2009    EF 72%  . Abdominal hysterectomy    . Colon surgery  2004    colectomy for diverticulosis  . Granulosa tumor excision  2000; 2003; 2004; 2007    "all in my abdomen including small intestines, outside my ?uterus/etc" (08/11/2012)  . Diagnostic laparoscopy      gallbladder removal; abdominal hernia repair  . Tee without cardioversion  12/31/2011    Procedure: TRANSESOPHAGEAL ECHOCARDIOGRAM (TEE);  Surgeon: [name], MD;  Location: [facility];  Service: Cardiovascular;  Laterality: N/A;  . Cardioversion  12/31/2011    Procedure: CARDIOVERSION;  Surgeon: [name], MD;  Location: [facility];  Service: Cardiovascular;  Laterality: N/A;  . Pacemaker insertion  2/11/4    [name] (SJM) leadless pacemaker (LEADLESS II STUDY PATEINT)  . Cardiac catheterization  09/03/2007    EF 70%; Failed attempt at PCI to OM  . Cardiac catheterization  11/01/2003    EF 70%  . Coronary angioplasty    . Coronary angioplasty with stent placement  2000; 08/11/2012; 11/12/2012    3 + 2 LAD & CFX; 2nd CFX stent 11/12/2012  .  Insert / replace / remove pacemaker  03/16/2012    [name] (SJM) leadless pacemaker (LEADLESS II STUDY PATEINT)  . Varicose vein surgery Bilateral 1977  . Hernia repair  2005    "laparoscopic"  . Cataract extraction, bilateral  2015   Family History  Problem Relation Age of Onset  . Heart attack Father   . Heart attack Brother   . Stroke Mother   . Diabetes Brother   . Diabetes Father   . Hypertension Brother   . Hypertension Father   . Kidney failure Brother    History  Substance Use Topics  . Smoking status: Former Smoker -- 1.00 packs/day for 32 years    Types: Cigarettes    Quit date: 02/03/1974  . Smokeless tobacco: Never Used  . Alcohol Use: 3.0 oz/week    5 Glasses  of wine per week     Comment: 11/12/2012 "4oz wine w/dinner 5 days/wk"   OB History    No data available     Review of Systems  Respiratory: Negative for shortness of breath.   Cardiovascular: Positive for chest pain.  Gastrointestinal: Positive for nausea and vomiting. Negative for abdominal pain and anorexia.  Musculoskeletal: Negative for back pain.  All other systems reviewed and are negative.     Allergies  Bee venom; Amoxicillin; and Other  Home Medications   Prior to Admission medications   Medication Sig Start Date End Date Taking? Authorizing Provider  acetaminophen (TYLENOL) 500 MG tablet Take 500 mg by mouth at bedtime.   Yes Historical Provider, MD  atorvastatin (LIPITOR) 80 MG tablet Take 1 tablet (80 mg total) by mouth every morning. 07/01/13  Yes [name], MD  Calcium Carb-Cholecalciferol (CALTRATE 600+D) 600-800 MG-UNIT TABS Take 1 tablet by mouth every morning.    Yes Historical Provider, MD  clopidogrel (PLAVIX) 75 MG tablet Take 1 tablet (75 mg total) by mouth daily with breakfast. 09/02/13  Yes [name], MD  Coenzyme Q-10 100 MG capsule Take 100 mg by mouth daily.    Yes Historical Provider, MD  diltiazem (CARDIZEM CD) 240 MG 24 hr capsule Take 240 mg by mouth daily.   Yes Historical Provider, MD  furosemide (LASIX) 40 MG tablet Take 80mg  every morning and 40mg  every evening Patient taking differently: Take 40-80 mg by mouth 2 (two) times daily. Take 80mg  every morning and 40mg  every evening 12/07/13  Yes [name], MD  isosorbide mononitrate (IMDUR) 60 MG 24 hr tablet Take 0.5-1 tablets (30-60 mg total) by mouth 2 (two) times daily. 60mg  every morning and 30mg  every evening 12/08/13  Yes [name], MD  lisinopril (PRINIVIL,ZESTRIL) 5 MG tablet Take 1 tablet (5 mg total) by mouth 2 (two) times daily. Patient taking differently: Take 5-10 mg by mouth 2 (two) times daily. 5mg  in the morning and 10mg  at night 11/04/13  Yes  [name], MD  metoprolol tartrate (LOPRESSOR) 25 MG tablet Take 1.5 tablets (37.5 mg total) by mouth 2 (two) times daily. 10/08/13  Yes Brett Canales, PA-C  Multiple Vitamin (MULTIVITAMIN WITH MINERALS) TABS tablet Take 1 tablet by mouth every morning.   Yes Historical Provider, MD  Multiple Vitamins-Minerals (OCUVITE PRESERVISION) TABS Take 1 tablet by mouth daily.    Yes Historical Provider, MD  nitroGLYCERIN (NITROSTAT) 0.4 MG SL tablet Place 0.4 mg under the tongue every 5 (five) minutes as needed for chest pain.   Yes Historical Provider, MD  Omega-3 Fatty Acids (FISH OIL PO) Take  1,200 mg by mouth daily.    Yes Historical Provider, MD  potassium chloride SA (K-DUR,KLOR-CON) 20 MEQ tablet Take 3 tablets (60 mEq total) by mouth daily. 09/23/13  Yes [name], MD  warfarin (COUMADIN) 2 MG tablet Take as directed by Coumadin clinic Patient taking differently: Take 1-2 mg by mouth daily. Take 2mg  daily except Tuesday, take 1mg  10/14/13  Yes [name], MD  furosemide (LASIX) 40 MG tablet TAKE 2 TABLETS BY MOUTH EVERY MORNING AND TAKE 1 TABLET EVERY EVENING AS NEEDED 12/06/13   [name], MD   BP 89/62 mmHg  Pulse 66  Temp(Src) 97.7 F (36.5 C) (Oral)  Resp 18  Ht 5' (1.524 m)  Wt 172 lb (78.019 kg)  BMI 33.59 kg/m2  SpO2 96% Physical Exam  Constitutional: She is oriented to person, place, and time. She appears well-developed and well-nourished.  HENT:  Head: Normocephalic and atraumatic.  Right Ear: External ear normal.  Left Ear: External ear normal.  Eyes: Conjunctivae and EOM are normal. Pupils are equal, round, and reactive to light.  Neck: Normal range of motion. Neck supple.  Cardiovascular: Normal rate, regular rhythm, normal heart sounds and intact distal pulses.   Pulmonary/Chest: Effort normal and breath sounds normal.  Abdominal: Soft. Bowel sounds are normal. There is no tenderness.  Musculoskeletal: Normal range of motion.   Neurological: She is alert and oriented to person, place, and time.  Skin: Skin is warm and dry.  Vitals reviewed.   ED Course  Procedures (including critical care time) Labs Review Labs Reviewed  COMPREHENSIVE METABOLIC PANEL - Abnormal; Notable for the following:    Glucose, Bld 111 (*)    Albumin 3.1 (*)    AST 38 (*)    Alkaline Phosphatase 134 (*)    GFR calc non Af Amer 62 (*)    GFR calc Af Amer 71 (*)    All other components within normal limits  PRO B NATRIURETIC PEPTIDE - Abnormal; Notable for the following:    Pro B Natriuretic peptide (BNP) 1383.0 (*)    All other components within normal limits  PROTIME-INR - Abnormal; Notable for the following:    Prothrombin Time 24.9 (*)    INR 2.23 (*)    All other components within normal limits  HEMOGLOBIN A1C - Abnormal; Notable for the following:    Hgb A1c MFr Bld 5.8 (*)    Mean Plasma Glucose 120 (*)    All other components within normal limits  COMPREHENSIVE METABOLIC PANEL - Abnormal; Notable for the following:    Potassium 3.2 (*)    Glucose, Bld 100 (*)    Calcium 8.3 (*)    Total Protein 5.9 (*)    Albumin 2.8 (*)    GFR calc non Af Amer 54 (*)    GFR calc Af Amer 63 (*)    All other components within normal limits  PROTIME-INR - Abnormal; Notable for the following:    Prothrombin Time 26.4 (*)    INR 2.40 (*)    All other components within normal limits  PROTIME-INR - Abnormal; Notable for the following:    Prothrombin Time 22.9 (*)    INR 2.00 (*)    All other components within normal limits  BASIC METABOLIC PANEL - Abnormal; Notable for the following:    Potassium 3.3 (*)    Glucose, Bld 162 (*)    GFR calc non Af Amer 58 (*)    GFR calc Af Amer 68 (*)  All other components within normal limits  CBC - Abnormal; Notable for the following:    RBC 3.75 (*)    Hemoglobin 11.5 (*)    HCT 34.7 (*)    All other components within normal limits  CBC - Abnormal; Notable for the following:    RBC 3.70  (*)    Hemoglobin 11.4 (*)    HCT 34.3 (*)    All other components within normal limits  BASIC METABOLIC PANEL - Abnormal; Notable for the following:    Potassium 3.5 (*)    GFR calc non Af Amer 60 (*)    GFR calc Af Amer 70 (*)    All other components within normal limits  CBC - Abnormal; Notable for the following:    RBC 3.80 (*)    Hemoglobin 11.6 (*)    HCT 35.0 (*)    All other components within normal limits  PROTIME-INR - Abnormal; Notable for the following:    Prothrombin Time 19.2 (*)    INR 1.60 (*)    All other components within normal limits  TROPONIN I  CBC WITH DIFFERENTIAL  URINALYSIS, ROUTINE W REFLEX MICROSCOPIC  TROPONIN I  TROPONIN I  TROPONIN I  HEPARIN LEVEL (UNFRACTIONATED)  HEPARIN LEVEL (UNFRACTIONATED)  I-STAT TROPOININ, ED    Imaging Review No results found.   EKG Interpretation   Date/Time:  Sunday December 11 2013 14:05:36 EST Ventricular Rate:  82 PR Interval:    QRS Duration: 160 QT Interval:  438 QTC Calculation: 511 R Axis:   -81 Text Interpretation:  Atrial flutter with variable A-V block Left axis  deviation Non-specific intra-ventricular conduction block Possible Lateral  infarct , age undetermined Abnormal ECG ED PHYSICIAN INTERPRETATION  AVAILABLE IN [facility] Confirmed by TEST, Record ([id number]) on  12/13/2013 7:48:52 AM      MDM   Final diagnoses:  Chest pain    80 y.o. female with pertinent PMH of CAD, prior MI, sleep apnea, aflutter, CHF presents with chest pain as described above. Symptoms have been ongoing for at least 2 weeks, typically last 1 hour and began after patient is awakened. They always resolved by noon. Patient had worsening symptoms today and after 3 nitroglycerin without relief the patient called EMS. She was given aspirin 325. Symptoms resolved. On arrival patient is a symptomatic, has an unremarkable physical exam with exception of tachycardia. EKG demonstrated a flutter with likely 2-1 block,  persistent bundle-branch block. No signs of ST elevation meeting Sgarbossa criteria.  Initial troponin unremarkable.    No new symptoms over last 6 hours.  Consulted cardiology.  Admitted in stable condition.  1. Ischemic chest pain   2. Chest pain   3. Coronary artery disease involving native coronary artery of native heart with unstable angina pectoris         Debby Freiberg, MD 12/14/13 1501

## 2013-12-11 NOTE — ED Notes (Signed)
Cardiology, PA requested repeat EKG as initial did not show the same findings from previous EKG's in lead I & II. Repeat done from room monitor showed the same. PA then requested another repeat using the portable EKG machine to verify it is not equipment. Third EKG done for this reason using portable machine. Same results.

## 2013-12-11 NOTE — Telephone Encounter (Signed)
Pt developed chest pain this am, unrelieved by SL NTG. She feels she needs to come to the ER.  She took all her morning medications (except K+) at 9:00 am. Her HR is 130, unusual for her.   Advised her to call 911 and come by EMS to [facility].   Pt stated she would do so.

## 2013-12-11 NOTE — Progress Notes (Signed)
ANTICOAGULATION CONSULT NOTE - Initial Consult  Pharmacy Consult for heparin Indication: chest pain/ACS and hx of afib  Allergies  Allergen Reactions  . Bee Venom Anaphylaxis  . Amoxicillin Diarrhea and Nausea And Vomiting  . Other     Pain medications cause severe vomiting.    Patient Measurements: Height: 5' (152.4 cm) Weight: 170 lb (77.111 kg) IBW/kg (Calculated) : 45.5 Heparin Dosing Weight: 63 kg  Vital Signs: Temp: 98 F (36.7 C) (11/08 1059) Temp Source: Oral (11/08 1609) BP: 110/49 mmHg (11/08 1530) Pulse Rate: 83 (11/08 1609)  Labs:  Recent Labs  12/11/13 1101 12/11/13 1228  HGB 12.0  --   HCT 36.4  --   PLT 332  --   LABPROT  --  24.9*  INR  --  2.23*  CREATININE 0.86  --   TROPONINI <0.30  --     Estimated Creatinine Clearance: 47.1 mL/min (by C-G formula based on Cr of 0.86).   Medical History: Past Medical History  Diagnosis Date  . IHD (ischemic heart disease)     Remote MI in 2000 with stent to LAD and LCX. Failed  intervention to OM in 2009. Managed medically since that time.  . Hypertension   . Hyperlipidemia   . Obesity   . Granulosa cell tumor     Has had several surgeries due to her tumor  . Diverticulitis   . Shortness of breath   . Peripheral vascular disease   . Other and unspecified angina pectoris   . MI (myocardial infarction) 06/1998  . Varicose veins   . Granulosa cell carcinoma     abd; last episode was in 2009  . Dysrhythmia   . Atrial flutter   . Tachycardia-bradycardia syndrome     leadless pacemaker ([name]) implanted by Dr [name]  . Atrial fibrillation 2013  . CHF (congestive heart failure)   . OSA on CPAP   . Arthritis     "touch in my fingers & right hip"   . Combined systolic and diastolic heart failure, NYHA class 3   . Coronary artery disease   . Pacemaker   . LBBB (left bundle branch block)     chronic    Medications:  Scheduled:  . acetaminophen  500 mg Oral QHS  . [START ON 12/12/2013]  atorvastatin  80 mg Oral q morning - 10a  . [START ON 12/12/2013] calcium-vitamin D  1 tablet Oral Q breakfast  . [START ON 12/12/2013] clopidogrel  75 mg Oral Q breakfast  . furosemide  40 mg Oral BID  . isosorbide mononitrate  60 mg Oral BID  . lisinopril  15 mg Oral QHS  . metoprolol tartrate  50 mg Oral BID  . [START ON 12/12/2013] multivitamin-lutein  1 capsule Oral Daily  . omega-3 acid ethyl esters  1 g Oral Daily  . potassium chloride SA  20 mEq Oral BID  . sodium chloride  3 mL Intravenous Q12H   Infusions:  . diltiazem (CARDIZEM) infusion 5 mg/hr (12/11/13 1326)    Assessment: 80 yo female with unstable angina will be put on heparin when INR is <2.  Patient was on coumadin prior to admission for hx of afib.  Hgb 12 and Plt 332 K on 12/11/13.  INR today is 2.23.   Goal of Therapy:  Heparin level 0.3-0.7 units/ml Monitor platelets by anticoagulation protocol: Yes   Plan:  - f/u on INR in am.  Start heparin if INR <2.   Patty [name], Tsz-Yin 12/11/2013,4:13 PM

## 2013-12-11 NOTE — ED Notes (Signed)
Cardiac history and frequent angina; for last two weeks feeling weak and having to take nitro upon getting out of bed everyday, but then CP resolves by noon. Today got up and took three nitros without it resolving, so called EMS. Given 324mg  as ASA. Upon arrival to ED, CP has resolved. Denies any CP now. EMS reports AFib with rates 110-130, she tooke 240mg  of cardizem PO before leaving home at around 0900.

## 2013-12-12 DIAGNOSIS — I4891 Unspecified atrial fibrillation: Secondary | ICD-10-CM

## 2013-12-12 DIAGNOSIS — I10 Essential (primary) hypertension: Secondary | ICD-10-CM

## 2013-12-12 LAB — TROPONIN I

## 2013-12-12 LAB — HEMOGLOBIN A1C
Hgb A1c MFr Bld: 5.8 % — ABNORMAL HIGH (ref <5.7)
Mean Plasma Glucose: 120 mg/dL — ABNORMAL HIGH (ref <117)

## 2013-12-12 LAB — PROTIME-INR
INR: 2.4 — AB (ref 0.00–1.49)
PROTHROMBIN TIME: 26.4 s — AB (ref 11.6–15.2)

## 2013-12-12 LAB — COMPREHENSIVE METABOLIC PANEL
ALBUMIN: 2.8 g/dL — AB (ref 3.5–5.2)
ALT: 25 U/L (ref 0–35)
ANION GAP: 12 (ref 5–15)
AST: 30 U/L (ref 0–37)
Alkaline Phosphatase: 115 U/L (ref 39–117)
BUN: 20 mg/dL (ref 6–23)
CALCIUM: 8.3 mg/dL — AB (ref 8.4–10.5)
CO2: 28 mEq/L (ref 19–32)
CREATININE: 0.96 mg/dL (ref 0.50–1.10)
Chloride: 104 mEq/L (ref 96–112)
GFR calc non Af Amer: 54 mL/min — ABNORMAL LOW (ref >90)
GFR, EST AFRICAN AMERICAN: 63 mL/min — AB (ref >90)
GLUCOSE: 100 mg/dL — AB (ref 70–99)
Potassium: 3.2 mEq/L — ABNORMAL LOW (ref 3.7–5.3)
Sodium: 144 mEq/L (ref 137–147)
TOTAL PROTEIN: 5.9 g/dL — AB (ref 6.0–8.3)
Total Bilirubin: 0.4 mg/dL (ref 0.3–1.2)

## 2013-12-12 MED ORDER — DILTIAZEM HCL ER COATED BEADS 240 MG PO CP24
240.0000 mg | ORAL_CAPSULE | Freq: Every day | ORAL | Status: DC
  Administered 2013-12-12 – 2013-12-15 (×4): 240 mg via ORAL
  Filled 2013-12-12 (×4): qty 1

## 2013-12-12 NOTE — Progress Notes (Signed)
Patient Profile: 80 y.o. female with a history of CAD, HTN, HLD, PVD and atrial fibrillation on Coumadin, admitted 12/11/13 for chest pain and Afib w/ RVR.  Subjective: Currently CP free. No dyspnea.   Objective: Vital signs in last 24 hours: Temp:  [97.6 F (36.4 C)-98.4 F (36.9 C)] 98.3 F (36.8 C) (11/09 0400) Pulse Rate:  [76-119] 101 (11/09 0400) Resp:  [14-30] 30 (11/09 0400) BP: (94-128)/(37-82) 119/64 mmHg (11/09 0400) SpO2:  [93 %-100 %] 94 % (11/09 0400) Weight:  [170 lb (77.111 kg)] 170 lb (77.111 kg) (11/08 1609) Last BM Date: 12/11/13  Intake/Output from previous day: 11/08 0701 - 11/09 0700 In: 240 [P.O.:240] Out: 400 [Urine:400] Intake/Output this shift:    Medications Current Facility-Administered Medications  Medication Dose Route Frequency Provider Last Rate Last Dose  . 0.9 %  sodium chloride infusion  250 mL Intravenous PRN Rhonda G Barrett, PA-C      . acetaminophen (TYLENOL) tablet 500 mg  500 mg Oral QHS Rhonda G Barrett, PA-C   500 mg at 12/11/13 2300  . acetaminophen (TYLENOL) tablet 650 mg  650 mg Oral Q4H PRN Rhonda G Barrett, PA-C      . ALPRAZolam (XANAX) tablet 0.25 mg  0.25 mg Oral BID PRN Evelene Croon Barrett, PA-C      . atorvastatin (LIPITOR) tablet 80 mg  80 mg Oral q morning - 10a Rhonda G Barrett, PA-C      . calcium-vitamin D (OSCAL WITH D) 500-200 MG-UNIT per tablet 1 tablet  1 tablet Oral Q breakfast Jacolyn Reedy, MD      . clopidogrel (PLAVIX) tablet 75 mg  75 mg Oral Q breakfast Rhonda G Barrett, PA-C      . diltiazem (CARDIZEM) 100 mg in dextrose 5 % 100 mL (1 mg/mL) infusion  5-15 mg/hr Intravenous Continuous Debby Freiberg, MD 5 mL/hr at 12/11/13 1326 5 mg/hr at 12/11/13 1326  . furosemide (LASIX) tablet 40 mg  40 mg Oral BID Rhonda G Barrett, PA-C   40 mg at 12/11/13 1756  . isosorbide mononitrate (IMDUR) 24 hr tablet 60 mg  60 mg Oral BID Evelene Croon Barrett, PA-C   60 mg at 12/11/13 2300  . lisinopril (PRINIVIL,ZESTRIL) tablet 15  mg  15 mg Oral QHS Rhonda G Barrett, PA-C   15 mg at 12/11/13 2300  . metoprolol tartrate (LOPRESSOR) tablet 50 mg  50 mg Oral BID Evelene Croon Barrett, PA-C   50 mg at 12/11/13 2301  . multivitamin-lutein (OCUVITE-LUTEIN) capsule 1 capsule  1 capsule Oral Daily Jacolyn Reedy, MD      . nitroGLYCERIN (NITROSTAT) SL tablet 0.4 mg  0.4 mg Sublingual Q5 Min x 3 PRN Rhonda G Barrett, PA-C      . omega-3 acid ethyl esters (LOVAZA) capsule 1 g  1 g Oral Daily Rhonda G Barrett, PA-C   1 g at 12/11/13 1756  . ondansetron (ZOFRAN) injection 4 mg  4 mg Intravenous Q6H PRN Rhonda G Barrett, PA-C      . potassium chloride SA (K-DUR,KLOR-CON) CR tablet 20 mEq  20 mEq Oral BID Evelene Croon Barrett, PA-C   20 mEq at 12/11/13 2301  . sodium chloride 0.9 % injection 3 mL  3 mL Intravenous Q12H Rhonda G Barrett, PA-C   3 mL at 12/11/13 2200  . sodium chloride 0.9 % injection 3 mL  3 mL Intravenous PRN Rhonda G Barrett, PA-C      . zolpidem (AMBIEN) tablet 5 mg  5 mg  Oral QHS PRN Lonn Georgia, PA-C        PE: General appearance: alert, cooperative and no distress Neck: no carotid bruit and no JVD Lungs: clear to auscultation bilaterally Heart: irregularly irregular rhythm Extremities: no LEE Pulses: 2+ and symmetric Skin: warm and dry Neurologic: Grossly normal  Lab Results:   Recent Labs  12/11/13 1101  WBC 7.1  HGB 12.0  HCT 36.4  PLT 332   BMET  Recent Labs  12/11/13 1101 12/12/13 0250  NA 143 144  K 3.7 3.2*  CL 103 104  CO2 29 28  GLUCOSE 111* 100*  BUN 20 20  CREATININE 0.86 0.96  CALCIUM 8.8 8.3*   PT/INR  Recent Labs  12/11/13 1228 12/12/13 0250  LABPROT 24.9* 26.4*  INR 2.23* 2.40*   Cardiac Panel (last 3 results)  Recent Labs  12/11/13 1745 12/11/13 2153 12/12/13 0250  TROPONINI <0.30 <0.30 <0.30    Assessment/Plan  Principal Problem:   Unstable angina Active Problems:   HTN (hypertension)   Hyperlipidemia   Atrial fibrillation with RVR  1. Atrial  Fibrillation w/ RVR: ventricular rate remains in the 90s-110s. IV Cardizem discontinued yesterday due to hypotension with reported SBP in the 80s. BP now stable. Plan is for PO Cardizem today. Continue BB.   2. Chest Pain: likely secondary to demand ischemia in the setting of rapid AFib.  However, patient notes recent nitrate responsive symptoms. Plan is for LHC once INR is <1.7.   3. CAD: h/o of multiple PCI with stenting. Plan for cath this admission. Continue medical therapy for now: BB, statin and Imdur.   4. HTN: controlled. Continue current meds.   5. HLD: continue Lipitor.   6. Chronic Oral anticoagulation: On Warfarin as an outpatient for Afib. Continue to hold in anticipation for possible LHC. INR today is 2.40. Start IV heparin when < 2.0. Restart Coumadin post cath with INR goal of 2-3.   7. Hypokalemia: K is 3.2. Will supplement with 40 mEq of K-Dur    LOS: 1 day    Brittainy M. Ladoris Gene 12/12/2013 7:49 AM  I have personally seen and examined this patient with Lyda Jester, PA-C.  I agree with the assessment and plan as outlined above. Will add back Cardizem CD 240 mg today. Hold home Lisinopril with hypotension. Continue beta blocker for rate control. Start IV heparin when INR is below 2.0. Coumadin being held for cath later this week. Chest pain this am responded to NTG. Cath is indicated before discharge with recent chest pain c/w unstable angina.   MCALHANY,CHRISTOPHER 12/12/2013 10:39 AM

## 2013-12-12 NOTE — Progress Notes (Signed)
Utilization review completed.  

## 2013-12-12 NOTE — Progress Notes (Signed)
ANTICOAGULATION CONSULT NOTE - Follow Up Consult  Pharmacy Consult for Heparin Indication: chest pain/ACS and hx of afib  Allergies  Allergen Reactions  . Bee Venom Anaphylaxis  . Amoxicillin Diarrhea and Nausea And Vomiting  . Other     Pain medications cause severe vomiting.    Patient Measurements: Height: 5' (152.4 cm) Weight: 170 lb (77.111 kg) IBW/kg (Calculated) : 45.5 Heparin Dosing Weight: 63 kg  Vital Signs: Temp: 98.3 F (36.8 C) (11/09 0400) Temp Source: Oral (11/09 0400) BP: 150/94 mmHg (11/09 1124) Pulse Rate: 117 (11/09 1000)  Labs:  Recent Labs  12/11/13 1101 12/11/13 1228 12/11/13 1745 12/11/13 2153 12/12/13 0250  HGB 12.0  --   --   --   --   HCT 36.4  --   --   --   --   PLT 332  --   --   --   --   LABPROT  --  24.9*  --   --  26.4*  INR  --  2.23*  --   --  2.40*  CREATININE 0.86  --   --   --  0.96  TROPONINI <0.30  --  <0.30 <0.30 <0.30    Estimated Creatinine Clearance: 42.2 mL/min (by C-G formula based on Cr of 0.96).   Medications:  Scheduled:  . acetaminophen  500 mg Oral QHS  . atorvastatin  80 mg Oral q morning - 10a  . calcium-vitamin D  1 tablet Oral Q breakfast  . clopidogrel  75 mg Oral Q breakfast  . diltiazem  240 mg Oral Daily  . furosemide  40 mg Oral BID  . isosorbide mononitrate  60 mg Oral BID  . metoprolol tartrate  50 mg Oral BID  . multivitamin-lutein  1 capsule Oral Daily  . omega-3 acid ethyl esters  1 g Oral Daily  . potassium chloride SA  20 mEq Oral BID  . sodium chloride  3 mL Intravenous Q12H    Assessment: 80 yo F with unstable angina will be put on heparin when INR is <2. Patient was on coumadin prior to admission for hx of afib. Hgb 12 and Plt 332 K on 12/11/13. INR today is 2.4.  Goal of Therapy:  Heparin level 0.3-0.7 units/ml Monitor platelets by anticoagulation protocol: Yes   Plan:  No heparin for now. Follow up AM INR.  [employer], Pharm.D., BCPS Clinical Pharmacist Pager  [phone] 12/12/2013 1:03 PM

## 2013-12-13 ENCOUNTER — Encounter (HOSPITAL_COMMUNITY): Payer: Self-pay | Admitting: General Practice

## 2013-12-13 DIAGNOSIS — I25110 Atherosclerotic heart disease of native coronary artery with unstable angina pectoris: Principal | ICD-10-CM

## 2013-12-13 LAB — CBC
HCT: 34.7 % — ABNORMAL LOW (ref 36.0–46.0)
HEMOGLOBIN: 11.5 g/dL — AB (ref 12.0–15.0)
MCH: 30.7 pg (ref 26.0–34.0)
MCHC: 33.1 g/dL (ref 30.0–36.0)
MCV: 92.5 fL (ref 78.0–100.0)
Platelets: 287 10*3/uL (ref 150–400)
RBC: 3.75 MIL/uL — ABNORMAL LOW (ref 3.87–5.11)
RDW: 14.3 % (ref 11.5–15.5)
WBC: 6.4 10*3/uL (ref 4.0–10.5)

## 2013-12-13 LAB — BASIC METABOLIC PANEL
ANION GAP: 14 (ref 5–15)
BUN: 22 mg/dL (ref 6–23)
CALCIUM: 8.5 mg/dL (ref 8.4–10.5)
CO2: 25 mEq/L (ref 19–32)
Chloride: 104 mEq/L (ref 96–112)
Creatinine, Ser: 0.9 mg/dL (ref 0.50–1.10)
GFR calc non Af Amer: 58 mL/min — ABNORMAL LOW (ref >90)
GFR, EST AFRICAN AMERICAN: 68 mL/min — AB (ref >90)
Glucose, Bld: 162 mg/dL — ABNORMAL HIGH (ref 70–99)
Potassium: 3.3 mEq/L — ABNORMAL LOW (ref 3.7–5.3)
Sodium: 143 mEq/L (ref 137–147)

## 2013-12-13 LAB — PROTIME-INR
INR: 2 — AB (ref 0.00–1.49)
PROTHROMBIN TIME: 22.9 s — AB (ref 11.6–15.2)

## 2013-12-13 LAB — HEPARIN LEVEL (UNFRACTIONATED): HEPARIN UNFRACTIONATED: 0.3 [IU]/mL (ref 0.30–0.70)

## 2013-12-13 MED ORDER — HEPARIN (PORCINE) IN NACL 100-0.45 UNIT/ML-% IJ SOLN
900.0000 [IU]/h | INTRAMUSCULAR | Status: DC
  Administered 2013-12-13: 900 [IU]/h via INTRAVENOUS
  Filled 2013-12-13: qty 250

## 2013-12-13 MED ORDER — POTASSIUM CHLORIDE CRYS ER 20 MEQ PO TBCR
20.0000 meq | EXTENDED_RELEASE_TABLET | Freq: Once | ORAL | Status: AC
  Administered 2013-12-13: 20 meq via ORAL

## 2013-12-13 NOTE — Plan of Care (Signed)
Problem: Phase I Progression Outcomes Goal: Aspirin unless contraindicated Outcome: Not Applicable Date Met:  28/36/62

## 2013-12-13 NOTE — Plan of Care (Signed)
Problem: Phase I Progression Outcomes Goal: Hemodynamically stable Outcome: Completed/Met Date Met:  12/13/13 Goal: Anginal pain relieved Outcome: Completed/Met Date Met:  12/13/13 Goal: MD aware of Cardiac Marker results Outcome: Completed/Met Date Met:  12/13/13 Goal: Voiding-avoid urinary catheter unless indicated Outcome: Completed/Met Date Met:  12/13/13

## 2013-12-13 NOTE — Progress Notes (Signed)
Patient Profile: 80 y.o. female with a history of CAD, HTN, HLD, PVD and atrial fibrillation on Coumadin, admitted 12/11/13 for chest pain and Afib w/ RVR.  Subjective: Currently CP free. No dyspnea.   Objective: Vital signs in last 24 hours: Temp:  [97.5 F (36.4 C)-98 F (36.7 C)] 97.7 F (36.5 C) (11/10 0616) Pulse Rate:  [80-117] 80 (11/10 0616) Resp:  [20] 20 (11/10 0616) BP: (122-165)/(83-100) 122/83 mmHg (11/10 0616) SpO2:  [98 %-99 %] 98 % (11/10 0616) Weight:  [170 lb (77.111 kg)] 170 lb (77.111 kg) (11/10 0616) Last BM Date: 12/13/13  Intake/Output from previous day: 11/09 0701 - 11/10 0700 In: 120 [P.O.:120] Out: 450 [Urine:450] Intake/Output this shift:    Medications Current Facility-Administered Medications  Medication Dose Route Frequency Provider Last Rate Last Dose  . 0.9 %  sodium chloride infusion  250 mL Intravenous PRN Rhonda G Barrett, PA-C      . acetaminophen (TYLENOL) tablet 500 mg  500 mg Oral QHS Rhonda G Barrett, PA-C   500 mg at 12/12/13 2105  . acetaminophen (TYLENOL) tablet 650 mg  650 mg Oral Q4H PRN Rhonda G Barrett, PA-C      . ALPRAZolam Duanne Moron) tablet 0.25 mg  0.25 mg Oral BID PRN Evelene Croon Barrett, PA-C      . atorvastatin (LIPITOR) tablet 80 mg  80 mg Oral q morning - 10a Rhonda G Barrett, PA-C   80 mg at 12/12/13 1000  . calcium-vitamin D (OSCAL WITH D) 500-200 MG-UNIT per tablet 1 tablet  1 tablet Oral Q breakfast Jacolyn Reedy, MD   1 tablet at 12/13/13 [id number]  . clopidogrel (PLAVIX) tablet 75 mg  75 mg Oral Q breakfast Evelene Croon Barrett, PA-C   75 mg at 12/13/13 0748  . diltiazem (CARDIZEM CD) 24 hr capsule 240 mg  240 mg Oral Daily Burnell Blanks, MD   240 mg at 12/12/13 1124  . furosemide (LASIX) tablet 40 mg  40 mg Oral BID Evelene Croon Barrett, PA-C   40 mg at 12/13/13 0748  . heparin ADULT infusion 100 units/mL (25000 units/250 mL)  900 Units/hr Intravenous Continuous Kimberly Ballard Hammons, RPH      . isosorbide  mononitrate (IMDUR) 24 hr tablet 60 mg  60 mg Oral BID Evelene Croon Barrett, PA-C   60 mg at 12/12/13 2105  . metoprolol tartrate (LOPRESSOR) tablet 50 mg  50 mg Oral BID Evelene Croon Barrett, PA-C   50 mg at 12/12/13 2106  . multivitamin-lutein (OCUVITE-LUTEIN) capsule 1 capsule  1 capsule Oral Daily Jacolyn Reedy, MD   1 capsule at 12/12/13 1124  . nitroGLYCERIN (NITROSTAT) SL tablet 0.4 mg  0.4 mg Sublingual Q5 Min x 3 PRN Evelene Croon Barrett, PA-C   0.4 mg at 12/12/13 0958  . omega-3 acid ethyl esters (LOVAZA) capsule 1 g  1 g Oral Daily Rhonda G Barrett, PA-C   1 g at 12/12/13 1000  . ondansetron (ZOFRAN) injection 4 mg  4 mg Intravenous Q6H PRN Rhonda G Barrett, PA-C      . potassium chloride SA (K-DUR,KLOR-CON) CR tablet 20 mEq  20 mEq Oral BID Evelene Croon Barrett, PA-C   20 mEq at 12/12/13 2106  . sodium chloride 0.9 % injection 3 mL  3 mL Intravenous Q12H Rhonda G Barrett, PA-C   3 mL at 12/12/13 2106  . sodium chloride 0.9 % injection 3 mL  3 mL Intravenous PRN Rhonda G Barrett, PA-C      .  zolpidem (AMBIEN) tablet 5 mg  5 mg Oral QHS PRN Lonn Georgia, PA-C        PE: General appearance: alert, cooperative and no distress Neck: no carotid bruit and no JVD Lungs: clear to auscultation bilaterally Heart: irregularly irregular rhythm Extremities: no LEE Pulses: 2+ and symmetric Skin: warm and dry Neurologic: Grossly normal  Lab Results:   Recent Labs  12/11/13 1101 12/13/13 0322  WBC 7.1 6.4  HGB 12.0 11.5*  HCT 36.4 34.7*  PLT 332 287   BMET  Recent Labs  12/11/13 1101 12/12/13 0250 12/13/13 0322  NA 143 144 143  K 3.7 3.2* 3.3*  CL 103 104 104  CO2 29 28 25   GLUCOSE 111* 100* 162*  BUN 20 20 22   CREATININE 0.86 0.96 0.90  CALCIUM 8.8 8.3* 8.5   PT/INR  Recent Labs  12/11/13 1228 12/12/13 0250 12/13/13 0322  LABPROT 24.9* 26.4* 22.9*  INR 2.23* 2.40* 2.00*   Cardiac Panel (last 3 results)  Recent Labs  12/11/13 1745 12/11/13 2153 12/12/13 0250    TROPONINI <0.30 <0.30 <0.30    Assessment/Plan  Principal Problem:   Unstable angina Active Problems:   HTN (hypertension)   Hyperlipidemia   Atrial fibrillation with RVR   1. Atrial Fibrillation: ventricular rate controlled in the 70s. Continue PO Cardizem and metoprolol.   2. Chest Pain: Currenlty pain free. No further symptoms with Imdur. Plan is for LHC once INR is <1.7.   3. CAD: h/o of multiple PCI with stenting. Plan for cath this admission. Will arrange for tomorrow in anticipation that her INR will be less than 1.7. Right radial approach. Continue medical therapy for now: BB, statin and Imdur.   4. HTN: controlled. Continue current meds.   5. HLD: continue Lipitor.   6. Chronic Oral anticoagulation: On Warfarin as an outpatient for Afib. Continue to hold in anticipation for planned LHC. INR today is 2.00. IV heparin ordered to start today. Restart Coumadin post cath with INR goal of 2-3.   7. Hypokalemia: K is 3.3. Will supplement with 40 mEq of K-Dur    LOS: 2 days    Brittainy M. Rosita Fire, PA-C 12/13/2013 9:07 AM  I have personally seen and examined this patient with Lyda Jester, PA-C.  I agree with the assessment and plan as outlined above. Feeling better. Rate controlled. INR down to 2.0 today. Hopefully repeat cath tomorrow if INR less than 1.7. No changes today. NPO at MN. Risks and benefits reviewed with pt. Will reassess in am tomorrow.   MCALHANY,CHRISTOPHER 12/13/2013 10:40 AM

## 2013-12-13 NOTE — Progress Notes (Signed)
ANTICOAGULATION CONSULT NOTE - Follow Up Consult  Pharmacy Consult for Heparin Indication: chest pain/ACS and hx of afib  Allergies  Allergen Reactions  . Bee Venom Anaphylaxis  . Amoxicillin Diarrhea and Nausea And Vomiting  . Other     Pain medications cause severe vomiting.    Patient Measurements: Height: 5' (152.4 cm) Weight: 170 lb (77.111 kg) IBW/kg (Calculated) : 45.5 Heparin Dosing Weight: 63 kg  Vital Signs: Temp: 98.4 F (36.9 C) (11/10 1300) Temp Source: Oral (11/10 1300) BP: 95/41 mmHg (11/10 1300) Pulse Rate: 59 (11/10 1300)  Labs:  Recent Labs  12/11/13 1101 12/11/13 1228 12/11/13 1745 12/11/13 2153 12/12/13 0250 12/13/13 0322 12/13/13 1650  HGB 12.0  --   --   --   --  11.5*  --   HCT 36.4  --   --   --   --  34.7*  --   PLT 332  --   --   --   --  287  --   LABPROT  --  24.9*  --   --  26.4* 22.9*  --   INR  --  2.23*  --   --  2.40* 2.00*  --   HEPARINUNFRC  --   --   --   --   --   --  0.30  CREATININE 0.86  --   --   --  0.96 0.90  --   TROPONINI <0.30  --  <0.30 <0.30 <0.30  --   --     Estimated Creatinine Clearance: 45 mL/min (by C-G formula based on Cr of 0.9).   Medications:  Scheduled:  . acetaminophen  500 mg Oral QHS  . atorvastatin  80 mg Oral q morning - 10a  . calcium-vitamin D  1 tablet Oral Q breakfast  . clopidogrel  75 mg Oral Q breakfast  . diltiazem  240 mg Oral Daily  . furosemide  40 mg Oral BID  . isosorbide mononitrate  60 mg Oral BID  . metoprolol tartrate  50 mg Oral BID  . multivitamin-lutein  1 capsule Oral Daily  . omega-3 acid ethyl esters  1 g Oral Daily  . potassium chloride SA  20 mEq Oral BID  . sodium chloride  3 mL Intravenous Q12H    Assessment: 80 yo F with unstable angina will be put on heparin when INR is <2. Patient was on coumadin prior to admission for hx of afib. Hgb 12 and Plt 332 K on 12/11/13.   INR today is 2.  Heparin started earlier this afternoon. Initial level is at low end of  goal at 0.3, will check confirmatory level to follow trend and dose accordingly.   Goal of Therapy:  Heparin level 0.3-0.7 units/ml Monitor platelets by anticoagulation protocol: Yes   Plan:  Continue heparin at 900 units/hr Recheck Heparin level in 8 hours Heparin level, CBC, and INR daily Follow up plans for cath when INR <1.7  Erin Hearing PharmD., BCPS Clinical Pharmacist Pager [phone] 12/13/2013 7:07 PM

## 2013-12-13 NOTE — Progress Notes (Signed)
ANTICOAGULATION CONSULT NOTE - Follow Up Consult  Pharmacy Consult for Heparin Indication: chest pain/ACS and hx of afib  Allergies  Allergen Reactions  . Bee Venom Anaphylaxis  . Amoxicillin Diarrhea and Nausea And Vomiting  . Other     Pain medications cause severe vomiting.    Patient Measurements: Height: 5' (152.4 cm) Weight: 170 lb (77.111 kg) IBW/kg (Calculated) : 45.5 Heparin Dosing Weight: 63 kg  Vital Signs: Temp: 97.7 F (36.5 C) (11/10 0616) Temp Source: Oral (11/10 0616) BP: 122/83 mmHg (11/10 0616) Pulse Rate: 80 (11/10 0616)  Labs:  Recent Labs  12/11/13 1101 12/11/13 1228 12/11/13 1745 12/11/13 2153 12/12/13 0250 12/13/13 0322  HGB 12.0  --   --   --   --  11.5*  HCT 36.4  --   --   --   --  34.7*  PLT 332  --   --   --   --  287  LABPROT  --  24.9*  --   --  26.4* 22.9*  INR  --  2.23*  --   --  2.40* 2.00*  CREATININE 0.86  --   --   --  0.96 0.90  TROPONINI <0.30  --  <0.30 <0.30 <0.30  --     Estimated Creatinine Clearance: 45 mL/min (by C-G formula based on Cr of 0.9).   Medications:  Scheduled:  . acetaminophen  500 mg Oral QHS  . atorvastatin  80 mg Oral q morning - 10a  . calcium-vitamin D  1 tablet Oral Q breakfast  . clopidogrel  75 mg Oral Q breakfast  . diltiazem  240 mg Oral Daily  . furosemide  40 mg Oral BID  . isosorbide mononitrate  60 mg Oral BID  . metoprolol tartrate  50 mg Oral BID  . multivitamin-lutein  1 capsule Oral Daily  . omega-3 acid ethyl esters  1 g Oral Daily  . potassium chloride SA  20 mEq Oral BID  . sodium chloride  3 mL Intravenous Q12H    Assessment: 80 yo F with unstable angina will be put on heparin when INR is <2. Patient was on coumadin prior to admission for hx of afib. Hgb 12 and Plt 332 K on 12/11/13.   INR today is 2.  Will begin heparin infusion without bolus.  Goal of Therapy:  Heparin level 0.3-0.7 units/ml Monitor platelets by anticoagulation protocol: Yes   Plan:  Start  Heparin infusion at 900 units/hr - no bolus 2/2 INR Heparin level in 8 hours Heparin level, CBC, and INR daily Follow up plans for cath when INR <1.7  [employer], Pharm.D., BCPS Clinical Pharmacist Pager [phone] 12/13/2013 8:26 AM

## 2013-12-14 ENCOUNTER — Encounter (HOSPITAL_COMMUNITY)
Admission: EM | Disposition: A | Discharge status: Home / Self Care | Payer: Self-pay | Source: Home / Self Care | Attending: Interventional Cardiology

## 2013-12-14 DIAGNOSIS — E785 Hyperlipidemia, unspecified: Secondary | ICD-10-CM

## 2013-12-14 DIAGNOSIS — I482 Chronic atrial fibrillation: Secondary | ICD-10-CM

## 2013-12-14 DIAGNOSIS — I25118 Atherosclerotic heart disease of native coronary artery with other forms of angina pectoris: Secondary | ICD-10-CM

## 2013-12-14 HISTORY — PX: LEFT HEART CATHETERIZATION WITH CORONARY ANGIOGRAM: SHX5451

## 2013-12-14 LAB — CBC
HCT: 35 % — ABNORMAL LOW (ref 36.0–46.0)
HEMATOCRIT: 34.3 % — AB (ref 36.0–46.0)
Hemoglobin: 11.4 g/dL — ABNORMAL LOW (ref 12.0–15.0)
Hemoglobin: 11.6 g/dL — ABNORMAL LOW (ref 12.0–15.0)
MCH: 30.8 pg (ref 26.0–34.0)
MCH: 30.5 pg (ref 26.0–34.0)
MCHC: 33.2 g/dL (ref 30.0–36.0)
MCHC: 33.1 g/dL (ref 30.0–36.0)
MCV: 92.7 fL (ref 78.0–100.0)
MCV: 92.1 fL (ref 78.0–100.0)
PLATELETS: 320 10*3/uL (ref 150–400)
Platelets: 307 10*3/uL (ref 150–400)
RBC: 3.7 MIL/uL — ABNORMAL LOW (ref 3.87–5.11)
RBC: 3.8 MIL/uL — ABNORMAL LOW (ref 3.87–5.11)
RDW: 14.2 % (ref 11.5–15.5)
RDW: 14.4 % (ref 11.5–15.5)
WBC: 7.4 10*3/uL (ref 4.0–10.5)
WBC: 6.4 10*3/uL (ref 4.0–10.5)

## 2013-12-14 LAB — BASIC METABOLIC PANEL
Anion gap: 13 (ref 5–15)
BUN: 22 mg/dL (ref 6–23)
CALCIUM: 8.6 mg/dL (ref 8.4–10.5)
CO2: 27 mEq/L (ref 19–32)
CREATININE: 0.88 mg/dL (ref 0.50–1.10)
Chloride: 106 mEq/L (ref 96–112)
GFR calc Af Amer: 70 mL/min — ABNORMAL LOW (ref >90)
GFR, EST NON AFRICAN AMERICAN: 60 mL/min — AB (ref >90)
Glucose, Bld: 98 mg/dL (ref 70–99)
Potassium: 3.5 mEq/L — ABNORMAL LOW (ref 3.7–5.3)
SODIUM: 146 meq/L (ref 137–147)

## 2013-12-14 LAB — PROTIME-INR
INR: 1.6 — ABNORMAL HIGH (ref 0.00–1.49)
Prothrombin Time: 19.2 seconds — ABNORMAL HIGH (ref 11.6–15.2)

## 2013-12-14 LAB — HEPARIN LEVEL (UNFRACTIONATED): HEPARIN UNFRACTIONATED: 0.4 [IU]/mL (ref 0.30–0.70)

## 2013-12-14 SURGERY — LEFT HEART CATHETERIZATION WITH CORONARY ANGIOGRAM
Anesthesia: LOCAL

## 2013-12-14 MED ORDER — ACETAMINOPHEN 325 MG PO TABS: 650.0000 mg | ORAL_TABLET | ORAL | Status: DC | PRN

## 2013-12-14 MED ORDER — FENTANYL CITRATE 0.05 MG/ML IJ SOLN
INTRAMUSCULAR | Status: AC
  Filled 2013-12-14: qty 2

## 2013-12-14 MED ORDER — POTASSIUM CHLORIDE CRYS ER 20 MEQ PO TBCR
40.0000 meq | EXTENDED_RELEASE_TABLET | Freq: Once | ORAL | Status: AC
  Administered 2013-12-15: 40 meq via ORAL
  Filled 2013-12-14: qty 4

## 2013-12-14 MED ORDER — VERAPAMIL HCL 2.5 MG/ML IV SOLN
INTRAVENOUS | Status: AC
  Filled 2013-12-14: qty 2

## 2013-12-14 MED ORDER — HEPARIN (PORCINE) IN NACL 2-0.9 UNIT/ML-% IJ SOLN
INTRAMUSCULAR | Status: AC
  Filled 2013-12-14: qty 1000

## 2013-12-14 MED ORDER — SODIUM CHLORIDE 0.9 % IJ SOLN: 3.0000 mL | Freq: Two times a day (BID) | INTRAMUSCULAR | Status: DC

## 2013-12-14 MED ORDER — MIDAZOLAM HCL 2 MG/2ML IJ SOLN
INTRAMUSCULAR | Status: AC
  Filled 2013-12-14: qty 2

## 2013-12-14 MED ORDER — WARFARIN SODIUM 3 MG PO TABS
3.0000 mg | ORAL_TABLET | Freq: Once | ORAL | Status: AC
  Administered 2013-12-14: 3 mg via ORAL
  Filled 2013-12-14: qty 1

## 2013-12-14 MED ORDER — ASPIRIN 81 MG PO CHEW
81.0000 mg | CHEWABLE_TABLET | ORAL | Status: AC
  Administered 2013-12-14: 81 mg via ORAL
  Filled 2013-12-14: qty 1

## 2013-12-14 MED ORDER — ASPIRIN EC 325 MG PO TBEC
325.0000 mg | DELAYED_RELEASE_TABLET | Freq: Once | ORAL | Status: AC
  Administered 2013-12-15: 325 mg via ORAL
  Filled 2013-12-14: qty 1

## 2013-12-14 MED ORDER — HEPARIN SODIUM (PORCINE) 1000 UNIT/ML IJ SOLN
INTRAMUSCULAR | Status: AC
  Filled 2013-12-14: qty 1

## 2013-12-14 MED ORDER — LIDOCAINE HCL (PF) 1 % IJ SOLN
INTRAMUSCULAR | Status: AC
  Filled 2013-12-14: qty 30

## 2013-12-14 MED ORDER — SODIUM CHLORIDE 0.9 % IV SOLN
INTRAVENOUS | Status: DC
  Administered 2013-12-14: 06:00:00 via INTRAVENOUS

## 2013-12-14 MED ORDER — HEPARIN (PORCINE) IN NACL 100-0.45 UNIT/ML-% IJ SOLN
900.0000 [IU]/h | INTRAMUSCULAR | Status: DC
  Administered 2013-12-14: 900 [IU]/h via INTRAVENOUS
  Filled 2013-12-14: qty 250

## 2013-12-14 MED ORDER — NITROGLYCERIN 1 MG/10 ML FOR IR/CATH LAB
INTRA_ARTERIAL | Status: AC
  Filled 2013-12-14: qty 10

## 2013-12-14 MED ORDER — SODIUM CHLORIDE 0.9 % IV SOLN
250.0000 mL | INTRAVENOUS | Status: DC | PRN
Start: 2013-12-14 — End: 2013-12-14

## 2013-12-14 MED ORDER — SODIUM CHLORIDE 0.9 % IJ SOLN: 3.0000 mL | INTRAMUSCULAR | Status: DC | PRN

## 2013-12-14 MED ORDER — WARFARIN - PHARMACIST DOSING INPATIENT: Freq: Every day | Status: DC

## 2013-12-14 MED ORDER — METOPROLOL TARTRATE 100 MG PO TABS
100.0000 mg | ORAL_TABLET | Freq: Two times a day (BID) | ORAL | Status: DC
  Administered 2013-12-14 – 2013-12-15 (×2): 100 mg via ORAL
  Filled 2013-12-14 (×2): qty 1

## 2013-12-14 NOTE — Progress Notes (Signed)
ANTICOAGULATION CONSULT NOTE - Follow Up Consult  Pharmacy Consult for Heparin, coumadin Indication: chest pain/ACS and hx of afib  Allergies  Allergen Reactions  . Bee Venom Anaphylaxis  . Amoxicillin Diarrhea and Nausea And Vomiting  . Other     Pain medications cause severe vomiting.    Patient Measurements: Height: 5' (152.4 cm) Weight: 172 lb (78.019 kg) IBW/kg (Calculated) : 45.5 Heparin Dosing Weight: 63 kg  Vital Signs: Temp: 97.7 F (36.5 C) (11/11 0837) Temp Source: Oral (11/11 0837) BP: 122/73 mmHg (11/11 1203) Pulse Rate: 88 (11/11 1010)  Labs:  Recent Labs  12/11/13 1745 12/11/13 2153 12/12/13 0250  12/13/13 0322 12/13/13 1650 12/14/13 0105 12/14/13 0435  HGB  --   --   --   < > 11.5*  --  11.4* 11.6*  HCT  --   --   --   --  34.7*  --  34.3* 35.0*  PLT  --   --   --   --  287  --  307 320  LABPROT  --   --  26.4*  --  22.9*  --   --  19.2*  INR  --   --  2.40*  --  2.00*  --   --  1.60*  HEPARINUNFRC  --   --   --   --   --  0.30 0.40  --   CREATININE  --   --  0.96  --  0.90  --   --  0.88  TROPONINI <0.30 <0.30 <0.30  --   --   --   --   --   < > = values in this interval not displayed.  Estimated Creatinine Clearance: 46.3 mL/min (by C-G formula based on Cr of 0.88).   Medications:  Scheduled:  . acetaminophen  500 mg Oral QHS  . aspirin EC  325 mg Oral Once  . atorvastatin  80 mg Oral q morning - 10a  . calcium-vitamin D  1 tablet Oral Q breakfast  . clopidogrel  75 mg Oral Q breakfast  . diltiazem  240 mg Oral Daily  . furosemide  40 mg Oral BID  . isosorbide mononitrate  60 mg Oral BID  . metoprolol tartrate  100 mg Oral BID  . multivitamin-lutein  1 capsule Oral Daily  . omega-3 acid ethyl esters  1 g Oral Daily  . potassium chloride SA  20 mEq Oral BID  . potassium chloride  40 mEq Oral Once    Assessment: 80 yo F with unstable angina s/p cath (no change in appearance since last cath) to resume heparin and coumadin.Patient was  on coumadin prior to admission for hx of afib.  INR today is 1.6. Prior to cath heparin was at 900 units/hr and heparin level was 0.4.  Heparin to restart 8 hrs post sheath removal (removed at 11:20am today)  Home coumadin dose: 2mg  daily except Tuesday, take 1mg   Goal of Therapy:  Heparin level 0.3-0.7 units/ml Monitor platelets by anticoagulation protocol: Yes   Plan:  -Restart heparin at 900 units/hr at 7:30pm -Heparin level and CBC daily -Coumadin 3mg  po today -Daily PT/INR  Hildred Laser, Pharm D 12/14/2013 12:54 PM

## 2013-12-14 NOTE — Plan of Care (Signed)
Problem: Consults Goal: Cardiac Cath Patient Education (See Patient Education module for education specifics.) Outcome: Progressing  Problem: Phase I Progression Outcomes Goal: Pain controlled with appropriate interventions Outcome: Completed/Met Date Met:  12/14/13 Goal: Initial discharge plan identified Outcome: Progressing Goal: Voiding-avoid urinary catheter unless indicated Outcome: Completed/Met Date Met:  12/14/13 Goal: Hemodynamically stable Outcome: Completed/Met Date Met:  12/14/13

## 2013-12-14 NOTE — Progress Notes (Signed)
TR band deflated progressively 3cc every 15 min per protocol, no compilation TR band removed 30 min after deflation,  Dry dressing applied to right wrist with gauze and tegaderm   Pt tolerated the procedure well Vitals signs are stable( see flow sheet), O2 sat right thumb : 96% RA Ferdinand Lango, RN

## 2013-12-14 NOTE — Progress Notes (Signed)
ANTICOAGULATION CONSULT NOTE - Follow Up Consult  Pharmacy Consult for heparin Indication: atrial fibrillation and USAP  Labs:  Recent Labs  12/11/13 1101 12/11/13 1228 12/11/13 1745 12/11/13 2153 12/12/13 0250 12/13/13 0322 12/13/13 1650 12/14/13 0105  HGB 12.0  --   --   --   --  11.5*  --  11.4*  HCT 36.4  --   --   --   --  34.7*  --  34.3*  PLT 332  --   --   --   --  287  --  307  LABPROT  --  24.9*  --   --  26.4* 22.9*  --   --   INR  --  2.23*  --   --  2.40* 2.00*  --   --   HEPARINUNFRC  --   --   --   --   --   --  0.30 0.40  CREATININE 0.86  --   --   --  0.96 0.90  --   --   TROPONINI <0.30  --  <0.30 <0.30 <0.30  --   --   --     Assessment/Plan:  80yo female remains therapeutic on heparin. Will continue gtt at current rate and continue to monitor.   Wynona Neat, PharmD, BCPS  12/14/2013,2:06 AM

## 2013-12-14 NOTE — Care Management Note (Unsigned)
    Page 1 of 1   12/14/2013     10:20:08 AM CARE MANAGEMENT NOTE 12/14/2013  Patient:  Patty [name], Patty [name]   Account Number:  [id number]  Date Initiated:  12/14/2013  Documentation initiated by:  GRAVES-BIGELOW,Oliveah Zwack  Subjective/Objective Assessment:   Pt admitted for cp. Plan for cath today.     Action/Plan:   CM will ocnitnue to monitor for disposition needs.   Anticipated DC Date:  12/15/2013   Anticipated DC Plan:  [facility]  CM consult      Choice offered to / List presented to:             Status of service:  In process, will continue to follow Medicare Important Message given?  YES (If response is "NO", the following Medicare IM given date fields will be blank) Date Medicare IM given:  12/14/2013 Medicare IM given by:  GRAVES-BIGELOW,Dreshaun Stene Date Additional Medicare IM given:   Additional Medicare IM given by:    Discharge Disposition:    Per UR Regulation:  Reviewed for med. necessity/level of care/duration of stay  If discussed at [place] of Stay Meetings, dates discussed:    Comments:

## 2013-12-14 NOTE — CV Procedure (Signed)
     Left Heart Catheterization with Coronary Angiography  Report  Patty [name]  80 y.o.  female July 10, 1930  Procedure Date: 12/14/2013 Referring Physician: C. Angelena Form, MD Primary Cardiologist:: Lendell Caprice, MD  INDICATIONS: 6 week history of angina pectoris occurring predominantly in the morning. Relieved by nitroglycerin. Later in the day after taking her a.m. Medications, she is not further bothered by angina.  PROCEDURE: 1. Left heart catheterization; 2. Coronary angiography; 3. Left ventriculography  CONSENT:  The risks, benefits, and details of the procedure were explained in detail to the patient. Risks including death, stroke, heart attack, kidney injury, allergy, limb ischemia, bleeding and radiation injury were discussed.  The patient verbalized understanding and wanted to proceed.  Informed written consent was obtained.  PROCEDURE TECHNIQUE:  After Xylocaine anesthesia a 5 French Slender sheath was placed in the right radiology artery with an angiocath and the modified Seldinger technique.  Coronary angiography was done using a 5 F AR 1 cm, JL 3.5 cm, and 5 French EBU 3.0 cm catheter.  Left ventriculography was done using the AR-1 catheter and hand injection.   Digital images were reviewed. Anatomy is unchanged compared to September when the in-stent restenosis and LAD territory was treated. FFR on the proximal LAD was 0.83. No new lesions are noted. I reviewed images with Dr. Angelena Form.   CONTRAST:  Total of 100 cc.  COMPLICATIONS:  none   HEMODYNAMICS:  Aortic pressure 92/50 mmHg; LV pressure 98/6 mmHg; LVEDP 11 mmHg  ANGIOGRAPHIC DATA:   The left main coronary artery is widely patent..  The left anterior descending artery is heavily calcified. There is eccentric proximal 50-70% LAD proximal to the stented segment. The segment is unchanged compared to prior. The stented segment is widely patent. There is diffuse mid and distal disease but not high-grade..  The left  circumflex artery is is stented from proximal vessel to the ostium. This region is widely patent. The mid vessel is diffusely diseased. A large first obtuse marginal is totally occluded and fills by left to left collaterals. The second obtuse marginal contains 70% stenosis. This is within a region of diffuse disease in the mid circumflex. Compared to September the region is unchanged in appearance.  The right coronary artery is patent. It is diffusely diseased throughout the mid segment. There is diffuse disease in the PDA. No critical stenosis is felt to be present.Marland Kitchen  LEFT VENTRICULOGRAM:  Left ventricular angiogram was done in the 30 RAO projection and revealed mild to moderate anterior wall hypokinesis with an ejection fraction of 40-50%   IMPRESSIONS:  1. Significant coronary artery disease with total occlusion of the first obtuse marginal, 70-80% second obtuse marginal stenosis, segmental diffuse 50-70% mid circumflex, 50-70% proximal LAD (recent FFR 0.83), diffusely diseased but non-critical RCA, with widely patent stents in the proximal LAD and circumflex. 2. No change in the angiographic appearance of the coronaries since the September procedure. 3. Left ventricular systolic dysfunction, with EF 40-50%   RECOMMENDATION:  Enhance rate control. This will be possible only to the extent that her blood pressure will allow. Will increase metoprolol to 100 mg twice a day but hold if systolic pressure less than 100 mmHg.  Resume Coumadin.  Resume IV heparin after 8 hours.  Management discussed with Dr. Miachel Roux and Julianne Handler.

## 2013-12-14 NOTE — Interval H&P Note (Signed)
Cath Lab Visit (complete for each Cath Lab visit)  Clinical Evaluation Leading to the Procedure:   ACS: No.  Non-ACS:    Anginal Classification: CCS III  Anti-ischemic medical therapy: Maximal Therapy (2 or more classes of medications)  Non-Invasive Test Results: No non-invasive testing performed  Prior CABG: No previous CABG      History and Physical Interval Note:  12/14/2013 10:26 AM  Patty [name]  has presented today for surgery, with the diagnosis of cp  The various methods of treatment have been discussed with the patient and family. After consideration of risks, benefits and other options for treatment, the patient has consented to  Procedure(s): LEFT HEART CATHETERIZATION WITH CORONARY ANGIOGRAM (N/A) as a surgical intervention .  The patient's history has been reviewed, patient examined, no change in status, stable for surgery.  I have reviewed the patient's chart and labs.  Questions were answered to the patient's satisfaction.     Sinclair Grooms

## 2013-12-14 NOTE — Progress Notes (Signed)
     SUBJECTIVE: No chest pain this am. No SOB  BP 124/76 mmHg  Pulse 63  Temp(Src) 97.6 F (36.4 C) (Oral)  Resp 18  Ht 5' (1.524 m)  Wt 172 lb (78.019 kg)  BMI 33.59 kg/m2  SpO2 95%  Intake/Output Summary (Last 24 hours) at 12/14/13 5053 Last data filed at 12/14/13 0446  Gross per 24 hour  Intake    291 ml  Output    800 ml  Net   -509 ml    PHYSICAL EXAM General: Well developed, well nourished, in no acute distress. Alert and oriented x 3.  Psych:  Good affect, responds appropriately Neck: No JVD. No masses noted.  Lungs: Clear bilaterally with no wheezes or rhonci noted.  Heart: RRR with no murmurs noted. Abdomen: Bowel sounds are present. Soft, non-tender.  Extremities: No lower extremity edema.   LABS: Basic Metabolic Panel:  Recent Labs  12/13/13 0322 12/14/13 0435  NA 143 146  K 3.3* 3.5*  CL 104 106  CO2 25 27  GLUCOSE 162* 98  BUN 22 22  CREATININE 0.90 0.88  CALCIUM 8.5 8.6   CBC:  Recent Labs  12/11/13 1101  12/14/13 0105 12/14/13 0435  WBC 7.1  < > 7.4 6.4  NEUTROABS 5.5  --   --   --   HGB 12.0  < > 11.4* 11.6*  HCT 36.4  < > 34.3* 35.0*  MCV 92.6  < > 92.7 92.1  PLT 332  < > 307 320  < > = values in this interval not displayed. Cardiac Enzymes:  Recent Labs  12/11/13 1745 12/11/13 2153 12/12/13 0250  TROPONINI <0.30 <0.30 <0.30   Current Meds: . acetaminophen  500 mg Oral QHS  . atorvastatin  80 mg Oral q morning - 10a  . calcium-vitamin D  1 tablet Oral Q breakfast  . clopidogrel  75 mg Oral Q breakfast  . diltiazem  240 mg Oral Daily  . furosemide  40 mg Oral BID  . isosorbide mononitrate  60 mg Oral BID  . metoprolol tartrate  50 mg Oral BID  . multivitamin-lutein  1 capsule Oral Daily  . omega-3 acid ethyl esters  1 g Oral Daily  . potassium chloride SA  20 mEq Oral BID  . sodium chloride  3 mL Intravenous Q12H  . sodium chloride  3 mL Intravenous Q12H     ASSESSMENT AND PLAN:  1. Chronic Atrial Fibrillation:  Rate controlled on Cardizem and metoprolol. Coumadin held awaiting cardiac cath. IV heparin infusion.   2. Chest Pain: Consistent with angina. Plans for cardiac cath today. INR is 1.6.    3. CAD: h/o of multiple PCI with stenting. As above, recent chest pains c/w unstable angina. The proximal LAD lesion was evaluated with FFR on 10/07/13 and was 0.83. This lesion may need to be addressed today. Right radial approach. Continue medical therapy for now including beta blocker, statin and Imdur. Will give ASA this am.   4. HTN: controlled. Continue current meds.   5. HLD: continue Lipitor.   6. Hypokalemia: K is 3.5. Will supplement today    Patty [name]  11/11/20156:32 AM

## 2013-12-14 NOTE — H&P (View-Only) (Signed)
     SUBJECTIVE: No chest pain this am. No SOB  BP 124/76 mmHg  Pulse 63  Temp(Src) 97.6 F (36.4 C) (Oral)  Resp 18  Ht 5' (1.524 m)  Wt 172 lb (78.019 kg)  BMI 33.59 kg/m2  SpO2 95%  Intake/Output Summary (Last 24 hours) at 12/14/13 8250 Last data filed at 12/14/13 0446  Gross per 24 hour  Intake    291 ml  Output    800 ml  Net   -509 ml    PHYSICAL EXAM General: Well developed, well nourished, in no acute distress. Alert and oriented x 3.  Psych:  Good affect, responds appropriately Neck: No JVD. No masses noted.  Lungs: Clear bilaterally with no wheezes or rhonci noted.  Heart: RRR with no murmurs noted. Abdomen: Bowel sounds are present. Soft, non-tender.  Extremities: No lower extremity edema.   LABS: Basic Metabolic Panel:  Recent Labs  12/13/13 0322 12/14/13 0435  NA 143 146  K 3.3* 3.5*  CL 104 106  CO2 25 27  GLUCOSE 162* 98  BUN 22 22  CREATININE 0.90 0.88  CALCIUM 8.5 8.6   CBC:  Recent Labs  12/11/13 1101  12/14/13 0105 12/14/13 0435  WBC 7.1  < > 7.4 6.4  NEUTROABS 5.5  --   --   --   HGB 12.0  < > 11.4* 11.6*  HCT 36.4  < > 34.3* 35.0*  MCV 92.6  < > 92.7 92.1  PLT 332  < > 307 320  < > = values in this interval not displayed. Cardiac Enzymes:  Recent Labs  12/11/13 1745 12/11/13 2153 12/12/13 0250  TROPONINI <0.30 <0.30 <0.30   Current Meds: . acetaminophen  500 mg Oral QHS  . atorvastatin  80 mg Oral q morning - 10a  . calcium-vitamin D  1 tablet Oral Q breakfast  . clopidogrel  75 mg Oral Q breakfast  . diltiazem  240 mg Oral Daily  . furosemide  40 mg Oral BID  . isosorbide mononitrate  60 mg Oral BID  . metoprolol tartrate  50 mg Oral BID  . multivitamin-lutein  1 capsule Oral Daily  . omega-3 acid ethyl esters  1 g Oral Daily  . potassium chloride SA  20 mEq Oral BID  . sodium chloride  3 mL Intravenous Q12H  . sodium chloride  3 mL Intravenous Q12H     ASSESSMENT AND PLAN:  1. Chronic Atrial Fibrillation:  Rate controlled on Cardizem and metoprolol. Coumadin held awaiting cardiac cath. IV heparin infusion.   2. Chest Pain: Consistent with angina. Plans for cardiac cath today. INR is 1.6.    3. CAD: h/o of multiple PCI with stenting. As above, recent chest pains c/w unstable angina. The proximal LAD lesion was evaluated with FFR on 10/07/13 and was 0.83. This lesion may need to be addressed today. Right radial approach. Continue medical therapy for now including beta blocker, statin and Imdur. Will give ASA this am.   4. HTN: controlled. Continue current meds.   5. HLD: continue Lipitor.   6. Hypokalemia: K is 3.5. Will supplement today    Patty [name]  11/11/20156:32 AM

## 2013-12-14 NOTE — Progress Notes (Signed)
1151 Received order per PCI. Pt did not have PCI. Please submit Phase 1 Cardiac Rehab order if we are to see pt. thank you. Graylon Good RN BSN 12/14/2013 11:52 AM

## 2013-12-15 ENCOUNTER — Encounter (HOSPITAL_COMMUNITY): Payer: Self-pay | Admitting: Physician Assistant

## 2013-12-15 DIAGNOSIS — Z950 Presence of cardiac pacemaker: Secondary | ICD-10-CM | POA: Diagnosis present

## 2013-12-15 LAB — CBC
HCT: 35.2 % — ABNORMAL LOW (ref 36.0–46.0)
HEMOGLOBIN: 11.6 g/dL — AB (ref 12.0–15.0)
MCH: 30.5 pg (ref 26.0–34.0)
MCHC: 33 g/dL (ref 30.0–36.0)
MCV: 92.6 fL (ref 78.0–100.0)
Platelets: 300 10*3/uL (ref 150–400)
RBC: 3.8 MIL/uL — ABNORMAL LOW (ref 3.87–5.11)
RDW: 14.4 % (ref 11.5–15.5)
WBC: 6.3 10*3/uL (ref 4.0–10.5)

## 2013-12-15 LAB — PROTIME-INR
INR: 1.49 (ref 0.00–1.49)
PROTHROMBIN TIME: 18.2 s — AB (ref 11.6–15.2)

## 2013-12-15 LAB — HEPARIN LEVEL (UNFRACTIONATED)
HEPARIN UNFRACTIONATED: 0.26 [IU]/mL — AB (ref 0.30–0.70)
Heparin Unfractionated: 0.32 IU/mL (ref 0.30–0.70)

## 2013-12-15 MED ORDER — METOPROLOL TARTRATE 100 MG PO TABS: 100.0000 mg | ORAL_TABLET | Freq: Two times a day (BID) | ORAL | Status: DC

## 2013-12-15 NOTE — Discharge Instructions (Signed)
Please take 1.5 tablets (3mg ) of coumadin tonight and then 1 tablet (2mg ) everyday until your INR appointment on Monday 12/19/13.    Radial Site Care Refer to this sheet in the next few weeks. These instructions provide you with information on caring for yourself after your procedure. Your caregiver may also give you more specific instructions. Your treatment has been planned according to current medical practices, but problems sometimes occur. Call your caregiver if you have any problems or questions after your procedure. HOME CARE INSTRUCTIONS  You may shower the day after the procedure.Remove the bandage (dressing) and gently wash the site with plain soap and water.Gently pat the site dry.   Do not apply powder or lotion to the site.   Do not submerge the affected site in water for 3 to 5 days.   Inspect the site at least twice daily.   Do not flex or bend the affected arm for 24 hours.   No lifting over 5 pounds (2.3 kg) for 5 days after your procedure.   Do not drive home if you are discharged the same day of the procedure. Have someone else drive you.   You may drive 24 hours after the procedure unless otherwise instructed by your caregiver.  What to expect:  Any bruising will usually fade within 1 to 2 weeks.   Blood that collects in the tissue (hematoma) may be painful to the touch. It should usually decrease in size and tenderness within 1 to 2 weeks.  SEEK IMMEDIATE MEDICAL CARE IF:  You have unusual pain at the radial site.   You have redness, warmth, swelling, or pain at the radial site.   You have drainage (other than a small amount of blood on the dressing).   You have chills.   You have a fever or persistent symptoms for more than 72 hours.   You have a fever and your symptoms suddenly get worse.   Your arm becomes pale, cool, tingly, or numb.   You have heavy bleeding from the site. Hold pressure on the site.

## 2013-12-15 NOTE — Discharge Summary (Signed)
Discharge Summary   Patient ID: SABENA WINNER MRN: [id number], DOB/AGE: [birth year] 80 y.o. Admit date: 12/11/2013 D/C date:     12/15/2013  Primary Cardiologist: Dr. Irish Lack   Principal Problem:   Atrial fibrillation with RVR Active Problems:   CAD (coronary artery disease)   HTN (hypertension)   Hyperlipidemia   Obesity   Granulosa cell tumor of ovary   Tachycardia-bradycardia syndrome   Pacemaker    Admission Dates: 12/11/13-12/15/13 Discharge Diagnosis: Afib with RVR  HPI: Patty [name] is a 80 y.o. female with a history of CAD s/p previous stenting to LAD and LCx, HTN, HLD, tachy-brady syndrome s/p PPM, PVD and atrial fibrillation on Coumadin who presented to [facility] ED on 12/11/13 with chest pain and SOB.     Hospital Course  Chronic Atrial Fibrillation: Rate controlled on Cardizem and metoprolol.  -- Coumadin held before cath. Resume coumadin now. Patient instructed to take 3mg  tonight and then resume 2mg  dosing until INR check on Monday of next week -- Will not need therapeutic INR before discharge and will not need bridging with Lovenox. INR 1.49 today  Chest Pain/CAD: Admitted with chest pain. She was placed on heparin when her INR fell under 2 and set up for cardiac cath. -- Cardiac cath 12/14/13 with stable CAD. No changes since last cath in September 2015. Patent stent LAD.  -- Cath with " Significant CAD with total occlusion of OM1, 70-80% occusion OM2, 50-70% occlusion mLCx, 50-70% occlusion pLAD (recent FFR 0.83), diffusely diseased, non-critical RCA, widely patent stents in pLAD and LCx." -- Possibility of angina with elevated heart rates. Her metoprolol was increased yesterday to 100 mg po BID and HR is controlled.  -- Continue medical therapy for now including beta blocker, statin and Imdur. Will stop ASA post cath since she is on Plavix and coumadin.   HTN: controlled. Continue current meds.   HLD: continue Lipitor.   Hypokalemia: K is 3.5.  Supplemented with K   The patient has had an uncomplicated hospital course and is recovering well. The radial catheter site is stable. She has been seen by Dr. Julianne Handler today and deemed ready for discharge home. All follow-up appointments have been scheduled. Discharge medications are listed below.   Discharge Vitals: Blood pressure 121/105, pulse 105, temperature 98.2 F (36.8 C), temperature source Oral, resp. rate 18, height 5' (1.524 m), weight 172 lb 6.4 oz (78.2 kg), SpO2 96 %.  Labs: Lab Results  Component Value Date   WBC 6.3 12/15/2013   HGB 11.6* 12/15/2013   HCT 35.2* 12/15/2013   MCV 92.6 12/15/2013   PLT 300 12/15/2013    Recent Labs Lab 12/12/13 0250  12/14/13 0435  NA 144  < > 146  K 3.2*  < > 3.5*  CL 104  < > 106  CO2 28  < > 27  BUN 20  < > 22  CREATININE 0.96  < > 0.88  CALCIUM 8.3*  < > 8.6  PROT 5.9*  --   --   BILITOT 0.4  --   --   ALKPHOS 115  --   --   ALT 25  --   --   AST 30  --   --   GLUCOSE 100*  < > 98  < > = values in this interval not displayed.  Lab Results  Component Value Date   CHOL 131 10/05/2013   HDL 61 10/05/2013   LDLCALC 51 10/05/2013   TRIG 93 10/05/2013  Diagnostic Studies/Procedures   Dg Chest 2 View  12/11/2013   CLINICAL DATA:  80 year old female with several day history of mid chest pain  EXAM: CHEST  2 VIEW  COMPARISON:  Prior chest x-ray 10/04/2013  FINDINGS: Stable cardiomegaly with left heart prominence. Metallic stent noted along the course of the LAD. Implantable loop recorder projects over the left chest. No focal airspace consolidation, pulmonary edema, pleural effusion or pneumothorax. Surgical clips in the right upper quadrant suggest prior cholecystectomy. Atherosclerotic calcifications throughout the aorta. No acute osseous abnormality.  IMPRESSION: 1. No acute cardiopulmonary disease. 2. Stable cardiomegaly.   Electronically Signed   By: Jacqulynn Cadet M.D.   On: 12/11/2013 12:22   Dg Hip Complete  Right  11/23/2013   CLINICAL DATA:  Acute right hip pain without trauma.  EXAM: RIGHT HIP - COMPLETE 2+ VIEW  COMPARISON:  None.  FINDINGS: There is no evidence of hip fracture or dislocation. There is no evidence of arthropathy or other focal bone abnormality.  IMPRESSION: Normal right hip.   Electronically Signed   By: Sabino Dick M.D.   On: 11/23/2013 17:27      Left Heart Catheterization with Coronary Angiography Report Procedure Date: 12/14/2013 Referring Physician: C. Angelena Form, MD Primary Cardiologist:: Lendell Caprice, MD INDICATIONS: 6 week history of angina pectoris occurring predominantly in the morning. Relieved by nitroglycerin. Later in the day after taking her a.m. Medications, she is not further bothered by angina. PROCEDURE: 1. Left heart catheterization; 2. Coronary angiography; 3. Left ventriculography CONSENT:  The risks, benefits, and details of the procedure were explained in detail to the patient. Risks including death, stroke, heart attack, kidney injury, allergy, limb ischemia, bleeding and radiation injury were discussed. The patient verbalized understanding and wanted to proceed. Informed written consent was obtained. PROCEDURE TECHNIQUE: After Xylocaine anesthesia a 5 French Slender sheath was placed in the right radiology artery with an angiocath and the modified Seldinger technique. Coronary angiography was done using a 5 F AR 1 cm, JL 3.5 cm, and 5 French EBU 3.0 cm catheter. Left ventriculography was done using the AR-1 catheter and hand injection.  Digital images were reviewed. Anatomy is unchanged compared to September when the in-stent restenosis and LAD territory was treated. FFR on the proximal LAD was 0.83. No new lesions are noted. I reviewed images with Dr. Angelena Form. CONTRAST: Total of 100 cc. COMPLICATIONS: none  HEMODYNAMICS: Aortic pressure 92/50 mmHg; LV pressure 98/6 mmHg; LVEDP 11 mmHg ANGIOGRAPHIC DATA: The left main coronary artery is widely  patent.. The left anterior descending artery is heavily calcified. There is eccentric proximal 50-70% LAD proximal to the stented segment. The segment is unchanged compared to prior. The stented segment is widely patent. There is diffuse mid and distal disease but not high-grade.. The left circumflex artery is is stented from proximal vessel to the ostium. This region is widely patent. The mid vessel is diffusely diseased. A large first obtuse marginal is totally occluded and fills by left to left collaterals. The second obtuse marginal contains 70% stenosis. This is within a region of diffuse disease in the mid circumflex. Compared to September the region is unchanged in appearance. The right coronary artery is patent. It is diffusely diseased throughout the mid segment. There is diffuse disease in the PDA. No critical stenosis is felt to be present.Marland Kitchen LEFT VENTRICULOGRAM: Left ventricular angiogram was done in the 30 RAO projection and revealed mild to moderate anterior wall hypokinesis with an ejection fraction of 40-50% IMPRESSIONS: 1. Significant  coronary artery disease with total occlusion of the first obtuse marginal, 70-80% second obtuse marginal stenosis, segmental diffuse 50-70% mid circumflex, 50-70% proximal LAD (recent FFR 0.83), diffusely diseased but non-critical RCA, with widely patent stents in the proximal LAD and circumflex. 2. No change in the angiographic appearance of the coronaries since the September procedure. 3. Left ventricular systolic dysfunction, with EF 40-50% RECOMMENDATION: Enhance rate control. This will be possible only to the extent that her blood pressure will allow. Will increase metoprolol to 100 mg twice a day but hold if systolic pressure less than 100 mmHg. Resume Coumadin. Resume IV heparin after 8 hours. Management discussed with Dr. Miachel Roux and Julianne Handler.   Discharge Medications     Medication List    TAKE these medications        acetaminophen  500 MG tablet  Commonly known as:  TYLENOL  Take 500 mg by mouth at bedtime.     atorvastatin 80 MG tablet  Commonly known as:  LIPITOR  Take 1 tablet (80 mg total) by mouth every morning.     CALTRATE 600+D 600-800 MG-UNIT Tabs  Generic drug:  Calcium Carb-Cholecalciferol  Take 1 tablet by mouth every morning.     clopidogrel 75 MG tablet  Commonly known as:  PLAVIX  Take 1 tablet (75 mg total) by mouth daily with breakfast.     Coenzyme Q-10 100 MG capsule  Take 100 mg by mouth daily.     diltiazem 240 MG 24 hr capsule  Commonly known as:  CARDIZEM CD  Take 240 mg by mouth daily.     FISH OIL PO  Take 1,200 mg by mouth daily.     furosemide 40 MG tablet  Commonly known as:  LASIX  TAKE 2 TABLETS BY MOUTH EVERY MORNING AND TAKE 1 TABLET EVERY EVENING AS NEEDED     isosorbide mononitrate 60 MG 24 hr tablet  Commonly known as:  IMDUR  Take 0.5-1 tablets (30-60 mg total) by mouth 2 (two) times daily. 60mg  every morning and 30mg  every evening     lisinopril 5 MG tablet  Commonly known as:  PRINIVIL,ZESTRIL  Take 1 tablet (5 mg total) by mouth 2 (two) times daily.     metoprolol 100 MG tablet  Commonly known as:  LOPRESSOR  Take 1 tablet (100 mg total) by mouth 2 (two) times daily.     multivitamin with minerals Tabs tablet  Take 1 tablet by mouth every morning.     nitroGLYCERIN 0.4 MG SL tablet  Commonly known as:  NITROSTAT  Place 0.4 mg under the tongue every 5 (five) minutes as needed for chest pain.     OCUVITE PRESERVISION Tabs  Take 1 tablet by mouth daily.     potassium chloride SA 20 MEQ tablet  Commonly known as:  K-DUR,KLOR-CON  Take 3 tablets (60 mEq total) by mouth daily.     warfarin 2 MG tablet  Commonly known as:  COUMADIN  Take as directed by Coumadin clinic        Disposition   The patient will be discharged in stable condition to home.  Follow-up Information    Follow up with [name]., MD On 12/28/2013.   Specialty:   Interventional Cardiology   Why:  @11  am   Contact information:   [address] [address] [phone]       Follow up with [facility] Office On 12/19/2013.   Specialty:  Cardiology  Why:  Coumadin clinic appointment @ 8am    Contact information:   [address], [place] [phone]        Duration of Discharge Encounter: Greater than 30 minutes including physician and PA time.  Mable Fill R PA-C 12/15/2013, 11:26 AM

## 2013-12-15 NOTE — Progress Notes (Signed)
   12/15/13 0402  BiPAP/CPAP/SIPAP  BiPAP/CPAP/SIPAP Pt Type Adult  Mask Type Full face mask  Mask Size Medium  Respiratory Rate 18 breaths/min  IPAP 9 cmH20  EPAP 9 cmH2O  Flow Rate 2 lpm  BiPAP/CPAP/SIPAP CPAP  Patient Home Equipment No  Auto Titrate No  BiPAP/CPAP /SiPAP Vitals  Pulse Rate 95  Resp 18

## 2013-12-15 NOTE — Plan of Care (Signed)
Problem: Discharge Progression Outcomes Goal: No anginal pain Outcome: Completed/Met Date Met:  12/15/13 Goal: Hemodynamically stable Outcome: Completed/Met Date Met:  04/17/92 Goal: Complications resolved/controlled Outcome: Completed/Met Date Met:  12/15/13 Goal: Barriers To Progression Addressed/Resolved Outcome: Completed/Met Date Met:  12/15/13 Goal: Discharge plan in place and appropriate Outcome: Completed/Met Date Met:  12/15/13 Goal: Vascular site scale level 0 - I Vascular Site Scale Level 0: No bruising/bleeding/hematoma Level I (Mild): Bruising/Ecchymosis, minimal bleeding/ooozing, palpable hematoma < 3 cm Level II (Moderate): Bleeding not affecting hemodynamic parameters, pseudoaneurysm, palpable hematoma > 3 cm Level III (Severe) Bleeding which affects hemodynamic parameters or retroperitoneal hemorrhage  Outcome: Completed/Met Date Met:  12/15/13 RIGHT RADIAL (0) Goal: Tolerates diet Outcome: Completed/Met Date Met:  12/15/13 Goal: Activity appropriate for discharge plan Outcome: Completed/Met Date Met:  12/15/13  Problem: Discharge Progression Outcomes Goal: INR monitor plan established Outcome: Completed/Met Date Met:  12/15/13 WILL F/U WITH COUMADIN CLINIC OP ON MON.  Problem: Phase II Progression Outcomes Goal: Discharge plan in place and appropriate Outcome: Completed/Met Date Met:  12/15/13 Goal: Hemodynamically stable Outcome: Completed/Met Date Met:  12/15/13 Goal: Ambulates up to 600 ft. in hall x 1 Outcome: Completed/Met Date Met:  12/15/13 Goal: Activity appropriate for discharge plan Outcome: Completed/Met Date Met:  12/15/13

## 2013-12-15 NOTE — Progress Notes (Signed)
ANTICOAGULATION CONSULT NOTE - Follow Up Consult  Pharmacy Consult for heparin Indication: atrial fibrillation  Labs:  Recent Labs  12/13/13 0322 12/13/13 1650 12/14/13 0105 12/14/13 0435 12/15/13 0358  HGB 11.5*  --  11.4* 11.6* 11.6*  HCT 34.7*  --  34.3* 35.0* 35.2*  PLT 287  --  307 320 300  LABPROT 22.9*  --   --  19.2* 18.2*  INR 2.00*  --   --  1.60* 1.49  HEPARINUNFRC  --  0.30 0.40  --  0.26*  CREATININE 0.90  --   --  0.88  --     Assessment/Plan:  80yo female slightly subtherapeutic on heparin though given levels prior to cath suspect heparin will continue to accumulate to therapeutic.  Will not change for now and recheck level.  Wynona Neat, PharmD, BCPS  12/15/2013,5:01 AM

## 2013-12-15 NOTE — Progress Notes (Signed)
SUBJECTIVE: No chest pain or SOB. Feels better.   BP 121/105 mmHg  Pulse 105  Temp(Src) 98.2 F (36.8 C) (Oral)  Resp 18  Ht 5' (1.524 m)  Wt 172 lb 6.4 oz (78.2 kg)  BMI 33.67 kg/m2  SpO2 96%  Intake/Output Summary (Last 24 hours) at 12/15/13 1012 Last data filed at 12/15/13 0900  Gross per 24 hour  Intake    360 ml  Output      2 ml  Net    358 ml   PHYSICAL EXAM General: Well developed, well nourished, in no acute distress. Alert and oriented x 3.  Psych:  Good affect, responds appropriately Neck: No JVD. No masses noted.  Lungs: Clear bilaterally with no wheezes or rhonci noted.  Heart: irreg irreg with no murmurs noted. Abdomen: Bowel sounds are present. Soft, non-tender.  Extremities: No lower extremity edema.   LABS: Basic Metabolic Panel:  Recent Labs  12/13/13 0322 12/14/13 0435  NA 143 146  K 3.3* 3.5*  CL 104 106  CO2 25 27  GLUCOSE 162* 98  BUN 22 22  CREATININE 0.90 0.88  CALCIUM 8.5 8.6   CBC:  Recent Labs  12/14/13 0435 12/15/13 0358  WBC 6.4 6.3  HGB 11.6* 11.6*  HCT 35.0* 35.2*  MCV 92.1 92.6  PLT 320 300    Current Meds: . acetaminophen  500 mg Oral QHS  . atorvastatin  80 mg Oral q morning - 10a  . calcium-vitamin D  1 tablet Oral Q breakfast  . clopidogrel  75 mg Oral Q breakfast  . diltiazem  240 mg Oral Daily  . furosemide  40 mg Oral BID  . isosorbide mononitrate  60 mg Oral BID  . metoprolol tartrate  100 mg Oral BID  . multivitamin-lutein  1 capsule Oral Daily  . omega-3 acid ethyl esters  1 g Oral Daily  . potassium chloride SA  20 mEq Oral BID  . Warfarin - Pharmacist Dosing Inpatient   Does not apply q1800   Cardiac cath 12/14/13: HEMODYNAMICS: Aortic pressure 92/50 mmHg; LV pressure 98/6 mmHg; LVEDP 11 mmHg  ANGIOGRAPHIC DATA: The left main coronary artery is widely patent..  The left anterior descending artery is heavily calcified. There is eccentric proximal 50-70% LAD proximal to the stented segment. The  segment is unchanged compared to prior. The stented segment is widely patent. There is diffuse mid and distal disease but not high-grade..  The left circumflex artery is is stented from proximal vessel to the ostium. This region is widely patent. The mid vessel is diffusely diseased. A large first obtuse marginal is totally occluded and fills by left to left collaterals. The second obtuse marginal contains 70% stenosis. This is within a region of diffuse disease in the mid circumflex. Compared to September the region is unchanged in appearance.  The right coronary artery is patent. It is diffusely diseased throughout the mid segment. There is diffuse disease in the PDA. No critical stenosis is felt to be present.Marland Kitchen  LEFT VENTRICULOGRAM: Left ventricular angiogram was done in the 30 RAO projection and revealed mild to moderate anterior wall hypokinesis with an ejection fraction of 40-50%  IMPRESSIONS: 1. Significant coronary artery disease with total occlusion of the first obtuse marginal, 70-80% second obtuse marginal stenosis, segmental diffuse 50-70% mid circumflex, 50-70% proximal LAD (recent FFR 0.83), diffusely diseased but non-critical RCA, with widely patent stents in the proximal LAD and circumflex.  2. No change in the angiographic appearance  of the coronaries since the September procedure.  3. Left ventricular systolic dysfunction, with EF 40-50%  RECOMMENDATION: Enhance rate control. This will be possible only to the extent that her blood pressure will allow. Will increase metoprolol to 100 mg twice a day but hold if systolic pressure less than 100 mmHg.   ASSESSMENT AND PLAN:  1. Chronic Atrial Fibrillation: Rate controlled on Cardizem and metoprolol. Coumadin held before cath. Resume coumadin now. Will need INR check on Monday of next week.  Will not need therapeutic INR before discharge and will not need bridging with Lovenox.   2. Chest Pain/CAD: Admitted with chest pain. Cardiac cath  12/14/13 with stable CAD. No changes since last cath in September 2015. Patent stent LAD. Possibility of angina with elevated heart rates. Her metoprolol was increased yesterday to 100 mg po BID and HR is controlled. Continue medical therapy for now including beta blocker, statin and Imdur. Will stop ASA post cath since she is on Plavix and coumadin.     4. HTN: controlled. Continue current meds.   5. HLD: continue Lipitor.   6. Hypokalemia: K is 3.5. Continue supplementation today  Dispo: will discharge to home today. We increased her metoprolol to 100 mg po BID. She will need f/u in coumadin clinic Monday. Follow up Dr. Irish Lack in 2-3 weeks.      Patty [name]  11/12/201510:12 AM

## 2013-12-19 ENCOUNTER — Ambulatory Visit (INDEPENDENT_AMBULATORY_CARE_PROVIDER_SITE_OTHER): Payer: Medicare Other | Admitting: *Deleted

## 2013-12-19 ENCOUNTER — Other Ambulatory Visit: Payer: Self-pay | Admitting: Interventional Cardiology

## 2013-12-19 DIAGNOSIS — I4891 Unspecified atrial fibrillation: Secondary | ICD-10-CM

## 2013-12-19 DIAGNOSIS — M25551 Pain in right hip: Secondary | ICD-10-CM | POA: Diagnosis not present

## 2013-12-19 LAB — POCT INR: INR: 3

## 2013-12-22 DIAGNOSIS — G4733 Obstructive sleep apnea (adult) (pediatric): Secondary | ICD-10-CM | POA: Diagnosis not present

## 2013-12-28 ENCOUNTER — Ambulatory Visit (INDEPENDENT_AMBULATORY_CARE_PROVIDER_SITE_OTHER): Payer: Medicare Other | Admitting: Interventional Cardiology

## 2013-12-28 ENCOUNTER — Encounter: Payer: Self-pay | Admitting: Interventional Cardiology

## 2013-12-28 VITALS — BP 136/66 | HR 64 | Ht 61.5 in | Wt 171.0 lb

## 2013-12-28 DIAGNOSIS — I10 Essential (primary) hypertension: Secondary | ICD-10-CM | POA: Diagnosis not present

## 2013-12-28 DIAGNOSIS — M25551 Pain in right hip: Secondary | ICD-10-CM | POA: Diagnosis not present

## 2013-12-28 DIAGNOSIS — I4891 Unspecified atrial fibrillation: Secondary | ICD-10-CM

## 2013-12-28 DIAGNOSIS — I2510 Atherosclerotic heart disease of native coronary artery without angina pectoris: Secondary | ICD-10-CM | POA: Diagnosis not present

## 2013-12-28 DIAGNOSIS — I200 Unstable angina: Secondary | ICD-10-CM

## 2013-12-28 NOTE — Progress Notes (Signed)
Patient ID: Patty [name], female   DOB: Mar 26, 1930, 80 y.o.   MRN: [id number] Patient ID: Patty [name], female   DOB: 05-Oct-1930, 80 y.o.   MRN: [id number]    [place], [place], [place]  [address] Phone: [phone] Fax:  [phone]  Date:  12/28/2013   ID:  Patty [name], DOB 1931/01/08, MRN [id number]  PCP:  Dorian Heckle, MD      History of Present Illness: Patty [name] is a 80 y.o. female with AFib and CAD. She had multivessel rotational atherectomy and stent placement in 7/14. She finished rehab without any problems.  She had a DES to an area of ISR from an old Bare metal stent from 2005.  She had more chest discomfort in her chest a few weeks ago in 11/15 and was admitted.  She was cathed and everything was the same with patent stents.  She was found to have a rapid heart rate.  Her metoprolol was increased significantly.  Lisinopril was decreased as well.  She spreads out the meds out more as well.  CAD/ASCVD:   Denies :   Diaphoresis.  Diet.  Dizziness.  Dyspnea on exertion.  Exercise.  Fatigue.  Leg edema.  Orthopnea.  Palpitations.   Since leaving the hospital, she is feeling well.  She is walking regularly.    Wt Readings from Last 3 Encounters:  12/28/13 171 lb (77.565 kg)  12/15/13 172 lb 6.4 oz (78.2 kg)  11/04/13 174 lb (78.926 kg)     Past Medical History  Diagnosis Date  . Hypertension   . Hyperlipidemia   . Obesity   . Diverticulitis   . Peripheral vascular disease   . Varicose veins   . Granulosa cell carcinoma     abd; last episode was in 2009  . Tachycardia-bradycardia syndrome     a. s/p leadless pacemaker ([name]) implanted by Dr [name]  . Atrial fibrillation 2013  . OSA on CPAP   . Combined systolic and diastolic heart failure, NYHA class 3   . Coronary artery disease     a. s/p multiple stents  b. LHC 12/14/13 with sig CAD with TO of OM1, 70-80% occusion OM2, 50-70% mLCx, 50-70% pLAD (recent FFR 0.83),  diffusely diseased RCA, widely patent stetns in pLAD and LCx.  Marland Kitchen LBBB (left bundle branch block)     chronic  . Pacemaker     Current Outpatient Prescriptions  Medication Sig Dispense Refill  . acetaminophen (TYLENOL) 500 MG tablet Take 500 mg by mouth at bedtime.    Marland Kitchen atorvastatin (LIPITOR) 80 MG tablet TAKE 1 TABLET (80 MG TOTAL) BY MOUTH EVERY MORNING. 30 tablet 5  . Calcium Carb-Cholecalciferol (CALTRATE 600+D) 600-800 MG-UNIT TABS Take 1 tablet by mouth every morning.     . clopidogrel (PLAVIX) 75 MG tablet TAKE 1 TABLET BY MOUTH EVERY DAY WITH BREAKFAST 30 tablet 3  . Coenzyme Q-10 100 MG capsule Take 100 mg by mouth daily.     Marland Kitchen diltiazem (CARDIZEM CD) 240 MG 24 hr capsule TAKE ONE CAPSULE BY MOUTH EVERY DAY 30 capsule 6  . furosemide (LASIX) 40 MG tablet TAKE 2 TABLETS BY MOUTH EVERY MORNING AND TAKE 1 TABLET EVERY EVENING AS NEEDED 90 tablet 1  . isosorbide mononitrate (IMDUR) 60 MG 24 hr tablet Take 0.5-1 tablets (30-60 mg total) by mouth 2 (two) times daily. 60mg  every morning and 30mg  every evening 45 tablet 1  . lisinopril (PRINIVIL,ZESTRIL) 5 MG tablet  Take 5 mg by mouth 2 (two) times daily.    . metoprolol tartrate (LOPRESSOR) 100 MG tablet Take 1 tablet (100 mg total) by mouth 2 (two) times daily. 60 tablet 11  . metoprolol tartrate (LOPRESSOR) 25 MG tablet     . Multiple Vitamin (MULTIVITAMIN WITH MINERALS) TABS tablet Take 1 tablet by mouth every morning.    . Multiple Vitamins-Minerals (OCUVITE PRESERVISION) TABS Take 1 tablet by mouth daily.     . nitroGLYCERIN (NITROSTAT) 0.4 MG SL tablet Place 0.4 mg under the tongue every 5 (five) minutes as needed for chest pain.    . Omega-3 Fatty Acids (FISH OIL PO) Take 1,200 mg by mouth daily.     . potassium chloride SA (K-DUR,KLOR-CON) 20 MEQ tablet Take 3 tablets (60 mEq total) by mouth daily. 90 tablet 3  . warfarin (COUMADIN) 2 MG tablet Take as directed by Coumadin clinic (Patient taking differently: Take 1-2 mg by mouth  daily. Take 2mg  daily except Tuesday, take 1mg ) 30 tablet 3   No current facility-administered medications for this visit.    Allergies:    Allergies  Allergen Reactions  . Bee Venom Anaphylaxis  . Amoxicillin Diarrhea and Nausea And Vomiting  . Other     Pain medications cause severe vomiting.    Social History:  The patient  reports that she quit smoking about 39 years ago. Her smoking use included Cigarettes. She has a 32 pack-year smoking history. She has never used smokeless tobacco. She reports that she drinks about 3.0 oz of alcohol per week. She reports that she does not use illicit drugs.   Family History:  The patient's family history includes Diabetes in her brother and father; Heart attack in her brother and father; Hypertension in her brother and father; Kidney failure in her brother; Stroke in her mother.   ROS:  Please see the history of present illness.  No nausea, vomiting.  No fevers, chills.  No focal weakness.  No dysuria. Atypical chest discomfort as noted above.   All other systems reviewed and negative.   PHYSICAL EXAM: VS:  BP 136/66 mmHg  Pulse 64  Ht 5' 1.5" (1.562 m)  Wt 171 lb (77.565 kg)  BMI 31.79 kg/m2 Well nourished, well developed, in no acute distress HEENT: normal Neck: no JVD, no carotid bruits Cardiac:  normal S1, S2; irregularly irregular, normal rate Lungs:  clear to auscultation bilaterally, no wheezing, rhonchi or rales Abd: soft, nontender, no hepatomegaly Ext: no edema Skin: warm and dry Neuro:   no focal abnormalities noted      ASSESSMENT AND PLAN:  CAD (coronary artery disease)  Continue Plavix Tablet, 75 MG, 1 tablet, Orally, Once a day Continue Atorvastatin Calcium Tablet, 80 MG, 1 tablet, Orally, Once a day Notes: No angina with additional rate control meds. s/p DES to the LAD and circumflex. No aspirin due to plavix and coumadin. OK for cardiac rehab.  She would like to start in January.  LDL 54 in 4/15. Continue statin.    2. Essential hypertension, benign  Notes: Controlled.     3. Atrial fibrillation  Continue Cardizem CD Capsule Extended Release 24 Hour, 240 MG, 1 capsule, Orally, Once a day -  Continue current dose of metoprolol.  Notes: Rate controlled- but do not want her to be pacer dependent. Coumadin for stroke prevention. Coumadin for stroke prevention.    4. Cardiomyopathy  Notes:  EF 40% in 10/14.  By cath, EF 55 % by cath in 9/15.  5. Ankle edema  Notes: Left leg  In the past.  Improved. Doubt DVT given that she is on coumadin. Continue to monitor.    Preventive Medicine  Adult topics discussed:  Diet: healthy diet, low calorie, low fat.  Exercise: 5 days a week, at least 30 minutes of aerobic exercise.      Signed, Mina Marble, MD, [facility] 12/28/2013 11:52 AM

## 2013-12-28 NOTE — Patient Instructions (Signed)
Your physician recommends that you continue on your current medications as directed. Please refer to the Current Medication list given to you today.  Your physician wants you to follow-up in: 6 months with Dr. Varanasi.  You will receive a reminder letter in the mail two months in advance. If you don't receive a letter, please call our office to schedule the follow-up appointment.  

## 2014-01-02 ENCOUNTER — Ambulatory Visit (INDEPENDENT_AMBULATORY_CARE_PROVIDER_SITE_OTHER): Payer: Medicare Other | Admitting: Pharmacist

## 2014-01-02 DIAGNOSIS — I4891 Unspecified atrial fibrillation: Secondary | ICD-10-CM

## 2014-01-02 DIAGNOSIS — M25551 Pain in right hip: Secondary | ICD-10-CM | POA: Diagnosis not present

## 2014-01-02 LAB — POCT INR: INR: 3.4

## 2014-01-05 ENCOUNTER — Emergency Department (HOSPITAL_COMMUNITY): Payer: Medicare Other

## 2014-01-05 ENCOUNTER — Observation Stay (HOSPITAL_COMMUNITY)
Admission: EM | Admit: 2014-01-05 | Discharge: 2014-01-06 | Disposition: A | Discharge status: Home / Self Care | Payer: Medicare Other | Attending: Interventional Cardiology | Admitting: Interventional Cardiology

## 2014-01-05 ENCOUNTER — Encounter (HOSPITAL_COMMUNITY): Payer: Self-pay | Admitting: Emergency Medicine

## 2014-01-05 DIAGNOSIS — E785 Hyperlipidemia, unspecified: Secondary | ICD-10-CM | POA: Diagnosis present

## 2014-01-05 DIAGNOSIS — Z7901 Long term (current) use of anticoagulants: Secondary | ICD-10-CM | POA: Diagnosis not present

## 2014-01-05 DIAGNOSIS — E876 Hypokalemia: Secondary | ICD-10-CM | POA: Diagnosis not present

## 2014-01-05 DIAGNOSIS — K5792 Diverticulitis of intestine, part unspecified, without perforation or abscess without bleeding: Secondary | ICD-10-CM | POA: Diagnosis not present

## 2014-01-05 DIAGNOSIS — R079 Chest pain, unspecified: Principal | ICD-10-CM | POA: Diagnosis present

## 2014-01-05 DIAGNOSIS — I4892 Unspecified atrial flutter: Secondary | ICD-10-CM | POA: Diagnosis present

## 2014-01-05 DIAGNOSIS — Z79899 Other long term (current) drug therapy: Secondary | ICD-10-CM | POA: Diagnosis not present

## 2014-01-05 DIAGNOSIS — I10 Essential (primary) hypertension: Secondary | ICD-10-CM | POA: Diagnosis not present

## 2014-01-05 DIAGNOSIS — I868 Varicose veins of other specified sites: Secondary | ICD-10-CM | POA: Insufficient documentation

## 2014-01-05 DIAGNOSIS — I495 Sick sinus syndrome: Secondary | ICD-10-CM | POA: Diagnosis not present

## 2014-01-05 DIAGNOSIS — Z880 Allergy status to penicillin: Secondary | ICD-10-CM | POA: Diagnosis not present

## 2014-01-05 DIAGNOSIS — Z87891 Personal history of nicotine dependence: Secondary | ICD-10-CM | POA: Diagnosis not present

## 2014-01-05 DIAGNOSIS — Z9861 Coronary angioplasty status: Secondary | ICD-10-CM | POA: Diagnosis not present

## 2014-01-05 DIAGNOSIS — Z7902 Long term (current) use of antithrombotics/antiplatelets: Secondary | ICD-10-CM | POA: Diagnosis not present

## 2014-01-05 DIAGNOSIS — I739 Peripheral vascular disease, unspecified: Secondary | ICD-10-CM | POA: Insufficient documentation

## 2014-01-05 DIAGNOSIS — Z9889 Other specified postprocedural states: Secondary | ICD-10-CM | POA: Insufficient documentation

## 2014-01-05 DIAGNOSIS — E669 Obesity, unspecified: Secondary | ICD-10-CM | POA: Diagnosis not present

## 2014-01-05 DIAGNOSIS — G4733 Obstructive sleep apnea (adult) (pediatric): Secondary | ICD-10-CM | POA: Diagnosis not present

## 2014-01-05 DIAGNOSIS — I252 Old myocardial infarction: Secondary | ICD-10-CM | POA: Insufficient documentation

## 2014-01-05 DIAGNOSIS — R0789 Other chest pain: Secondary | ICD-10-CM | POA: Diagnosis not present

## 2014-01-05 DIAGNOSIS — R072 Precordial pain: Secondary | ICD-10-CM | POA: Diagnosis not present

## 2014-01-05 DIAGNOSIS — J9811 Atelectasis: Secondary | ICD-10-CM | POA: Diagnosis not present

## 2014-01-05 DIAGNOSIS — I2510 Atherosclerotic heart disease of native coronary artery without angina pectoris: Secondary | ICD-10-CM | POA: Diagnosis present

## 2014-01-05 DIAGNOSIS — E66811 Obesity, class 1: Secondary | ICD-10-CM | POA: Diagnosis present

## 2014-01-05 DIAGNOSIS — I5040 Unspecified combined systolic (congestive) and diastolic (congestive) heart failure: Secondary | ICD-10-CM | POA: Insufficient documentation

## 2014-01-05 DIAGNOSIS — Z950 Presence of cardiac pacemaker: Secondary | ICD-10-CM | POA: Diagnosis present

## 2014-01-05 DIAGNOSIS — I447 Left bundle-branch block, unspecified: Secondary | ICD-10-CM | POA: Insufficient documentation

## 2014-01-05 DIAGNOSIS — R0602 Shortness of breath: Secondary | ICD-10-CM | POA: Diagnosis not present

## 2014-01-05 DIAGNOSIS — I4891 Unspecified atrial fibrillation: Secondary | ICD-10-CM | POA: Diagnosis not present

## 2014-01-05 DIAGNOSIS — I2583 Coronary atherosclerosis due to lipid rich plaque: Secondary | ICD-10-CM

## 2014-01-05 HISTORY — DX: Acute myocardial infarction, unspecified: I21.9

## 2014-01-05 LAB — I-STAT CHEM 8, ED
BUN: 19 mg/dL (ref 6–23)
CHLORIDE: 102 meq/L (ref 96–112)
Calcium, Ion: 1.12 mmol/L — ABNORMAL LOW (ref 1.13–1.30)
Creatinine, Ser: 0.9 mg/dL (ref 0.50–1.10)
GLUCOSE: 100 mg/dL — AB (ref 70–99)
HCT: 42 % (ref 36.0–46.0)
Hemoglobin: 14.3 g/dL (ref 12.0–15.0)
POTASSIUM: 3.2 meq/L — AB (ref 3.7–5.3)
Sodium: 144 mEq/L (ref 137–147)
TCO2: 26 mmol/L (ref 0–100)

## 2014-01-05 LAB — I-STAT TROPONIN, ED: Troponin i, poc: 0.01 ng/mL (ref 0.00–0.08)

## 2014-01-05 LAB — PROTIME-INR
INR: 1.94 — ABNORMAL HIGH (ref 0.00–1.49)
Prothrombin Time: 22.3 seconds — ABNORMAL HIGH (ref 11.6–15.2)

## 2014-01-05 LAB — PRO B NATRIURETIC PEPTIDE: Pro B Natriuretic peptide (BNP): 1341 pg/mL — ABNORMAL HIGH (ref 0–450)

## 2014-01-05 LAB — TROPONIN I
Troponin I: <0.3 ng/mL (ref <0.30)
Troponin I: 0.32 ng/mL (ref <0.30)

## 2014-01-05 MED ORDER — WARFARIN SODIUM 1 MG PO TABS: 1.0000 mg | ORAL_TABLET | Freq: Every day | ORAL | Status: DC

## 2014-01-05 MED ORDER — ISOSORBIDE MONONITRATE ER 30 MG PO TB24: 30.0000 mg | ORAL_TABLET | Freq: Two times a day (BID) | ORAL | Status: DC

## 2014-01-05 MED ORDER — METOPROLOL TARTRATE 100 MG PO TABS
100.0000 mg | ORAL_TABLET | Freq: Two times a day (BID) | ORAL | Status: DC
  Administered 2014-01-05 – 2014-01-06 (×2): 100 mg via ORAL
  Filled 2014-01-05 (×2): qty 1

## 2014-01-05 MED ORDER — ACETAMINOPHEN 500 MG PO TABS
500.0000 mg | ORAL_TABLET | Freq: Every day | ORAL | Status: DC
  Administered 2014-01-05: 500 mg via ORAL
  Filled 2014-01-05: qty 1

## 2014-01-05 MED ORDER — CLOPIDOGREL BISULFATE 75 MG PO TABS
75.0000 mg | ORAL_TABLET | Freq: Every day | ORAL | Status: DC
  Administered 2014-01-05 – 2014-01-06 (×2): 75 mg via ORAL
  Filled 2014-01-05 (×2): qty 1

## 2014-01-05 MED ORDER — ASPIRIN 300 MG RE SUPP: 300.0000 mg | RECTAL | Status: AC

## 2014-01-05 MED ORDER — SODIUM CHLORIDE 0.9 % IJ SOLN: 3.0000 mL | INTRAMUSCULAR | Status: DC | PRN

## 2014-01-05 MED ORDER — ONDANSETRON HCL 4 MG/2ML IJ SOLN: 4.0000 mg | Freq: Four times a day (QID) | INTRAMUSCULAR | Status: DC | PRN

## 2014-01-05 MED ORDER — ISOSORBIDE MONONITRATE ER 60 MG PO TB24
60.0000 mg | ORAL_TABLET | Freq: Every morning | ORAL | Status: DC
  Administered 2014-01-06: 60 mg via ORAL
  Filled 2014-01-05 (×2): qty 1

## 2014-01-05 MED ORDER — SPIRONOLACTONE 12.5 MG HALF TABLET
12.5000 mg | ORAL_TABLET | Freq: Every day | ORAL | Status: DC
  Administered 2014-01-05 – 2014-01-06 (×2): 12.5 mg via ORAL
  Filled 2014-01-05 (×2): qty 1

## 2014-01-05 MED ORDER — ACETAMINOPHEN 325 MG PO TABS: 650.0000 mg | ORAL_TABLET | ORAL | Status: DC | PRN

## 2014-01-05 MED ORDER — SODIUM CHLORIDE 0.9 % IV SOLN: 250.0000 mL | INTRAVENOUS | Status: DC | PRN

## 2014-01-05 MED ORDER — ASPIRIN EC 81 MG PO TBEC
81.0000 mg | DELAYED_RELEASE_TABLET | Freq: Every day | ORAL | Status: DC
  Administered 2014-01-06: 81 mg via ORAL
  Filled 2014-01-05: qty 1

## 2014-01-05 MED ORDER — NITROGLYCERIN 0.4 MG SL SUBL: 0.4000 mg | SUBLINGUAL_TABLET | SUBLINGUAL | Status: DC | PRN

## 2014-01-05 MED ORDER — POTASSIUM CHLORIDE CRYS ER 20 MEQ PO TBCR
60.0000 meq | EXTENDED_RELEASE_TABLET | Freq: Every day | ORAL | Status: DC
  Administered 2014-01-05 – 2014-01-06 (×2): 60 meq via ORAL
  Filled 2014-01-05 (×2): qty 3

## 2014-01-05 MED ORDER — WARFARIN - PHARMACIST DOSING INPATIENT: Freq: Every day | Status: DC

## 2014-01-05 MED ORDER — ASPIRIN 81 MG PO CHEW
324.0000 mg | CHEWABLE_TABLET | ORAL | Status: AC
  Administered 2014-01-05: 324 mg via ORAL
  Filled 2014-01-05: qty 4

## 2014-01-05 MED ORDER — SODIUM CHLORIDE 0.9 % IJ SOLN
3.0000 mL | Freq: Two times a day (BID) | INTRAMUSCULAR | Status: DC
  Administered 2014-01-05 (×2): 3 mL via INTRAVENOUS

## 2014-01-05 MED ORDER — FUROSEMIDE 40 MG PO TABS
40.0000 mg | ORAL_TABLET | Freq: Every day | ORAL | Status: DC
  Administered 2014-01-06: 40 mg via ORAL
  Filled 2014-01-05: qty 1

## 2014-01-05 MED ORDER — DILTIAZEM HCL ER COATED BEADS 180 MG PO CP24
360.0000 mg | ORAL_CAPSULE | Freq: Every day | ORAL | Status: DC
  Administered 2014-01-05 – 2014-01-06 (×2): 360 mg via ORAL
  Filled 2014-01-05: qty 1
  Filled 2014-01-05: qty 2

## 2014-01-05 MED ORDER — ATORVASTATIN CALCIUM 80 MG PO TABS: 80.0000 mg | ORAL_TABLET | Freq: Every day | ORAL | Status: DC

## 2014-01-05 MED ORDER — ISOSORBIDE MONONITRATE ER 30 MG PO TB24
30.0000 mg | ORAL_TABLET | Freq: Every evening | ORAL | Status: DC
  Administered 2014-01-05: 30 mg via ORAL
  Filled 2014-01-05: qty 1

## 2014-01-05 MED ORDER — WARFARIN SODIUM 2 MG PO TABS
2.0000 mg | ORAL_TABLET | Freq: Once | ORAL | Status: AC
  Administered 2014-01-05: 2 mg via ORAL
  Filled 2014-01-05: qty 1

## 2014-01-05 MED ORDER — LISINOPRIL 5 MG PO TABS
5.0000 mg | ORAL_TABLET | Freq: Two times a day (BID) | ORAL | Status: DC
  Administered 2014-01-05 – 2014-01-06 (×2): 5 mg via ORAL
  Filled 2014-01-05 (×2): qty 1

## 2014-01-05 MED ORDER — POTASSIUM CHLORIDE CRYS ER 20 MEQ PO TBCR
40.0000 meq | EXTENDED_RELEASE_TABLET | Freq: Once | ORAL | Status: AC
  Administered 2014-01-05: 40 meq via ORAL
  Filled 2014-01-05: qty 2

## 2014-01-05 NOTE — ED Provider Notes (Signed)
CSN: [id number]     Arrival date & time 01/05/14  4562 History   First MD Initiated Contact with Patient 01/05/14 [phone]     Chief Complaint  Patient presents with  . Chest Pain      HPI Patient began having substernal chest pain similar to previous episodes of angina small around 7:00.  She took 2 nitroglycerin over a period of 45 minutes with small amount relief.  She did have diaphoresis no vomiting with it.  Has strong history of ACS with recent heart catheter.  Chronic history of atrial fibrillation. Past Medical History  Diagnosis Date  . Hypertension   . Hyperlipidemia   . Obesity   . Diverticulitis   . Peripheral vascular disease   . Varicose veins   . Granulosa cell carcinoma     abd; last episode was in 2009  . Tachycardia-bradycardia syndrome     a. s/p leadless pacemaker ([name]) implanted by Dr [name]  . Atrial fibrillation 2013  . OSA on CPAP   . Combined systolic and diastolic heart failure, NYHA class 3   . Coronary artery disease     a. s/p multiple stents  b. LHC 12/14/13 with sig CAD with TO of OM1, 70-80% occusion OM2, 50-70% mLCx, 50-70% pLAD (recent FFR 0.83), diffusely diseased RCA, widely patent stetns in pLAD and LCx.  Marland Kitchen LBBB (left bundle branch block)     chronic  . Pacemaker   . Myocardial infarction 2002   Past Surgical History  Procedure Laterality Date  . Cholecystectomy  1980's  . Salivary gland surgery  2000's    "had a little lump removed; granulosa related; it was benign" (08/11/2012)  . US echocardiography  11/21/2003    EF 55-60%  . Cardiovascular stress test  03/23/2009    EF 72%  . Abdominal hysterectomy    . Colon surgery  2004    colectomy for diverticulosis  . Granulosa tumor excision  2000; 2003; 2004; 2007    "all in my abdomen including small intestines, outside my ?uterus/etc" (08/11/2012)  . Diagnostic laparoscopy      gallbladder removal; abdominal hernia repair  . Tee without cardioversion  12/31/2011    Procedure:  TRANSESOPHAGEAL ECHOCARDIOGRAM (TEE);  Surgeon: [name], MD;  Location: [facility];  Service: Cardiovascular;  Laterality: N/A;  . Cardioversion  12/31/2011    Procedure: CARDIOVERSION;  Surgeon: [name], MD;  Location: [facility];  Service: Cardiovascular;  Laterality: N/A;  . Pacemaker insertion  2/11/4    [name] (SJM) leadless pacemaker (LEADLESS II STUDY PATEINT)  . Cardiac catheterization  09/03/2007    EF 70%; Failed attempt at PCI to OM  . Cardiac catheterization  11/01/2003    EF 70%  . Coronary angioplasty    . Coronary angioplasty with stent placement  2000; 08/11/2012; 11/12/2012    3 + 2 LAD & CFX; 2nd CFX stent 11/12/2012  . Insert / replace / remove pacemaker  03/16/2012    [name] (SJM) leadless pacemaker (LEADLESS II STUDY PATEINT)  . Varicose vein surgery Bilateral 1977  . Hernia repair  2005    "laparoscopic"  . Cataract extraction, bilateral  2015  . Cardioversion N/A 12/31/2011    Procedure: CARDIOVERSION;  Surgeon: [name], MD;  Location: [facility] CATH LAB;  Service: Cardiovascular;  Laterality: N/A;  . Permanent pacemaker insertion N/A 03/16/2012    Procedure: PERMANENT PACEMAKER INSERTION;  Surgeon: Thompson Grayer, MD;  Location: [facility] CATH LAB;  Service: Cardiovascular;  Laterality: N/A;  .  Percutaneous coronary intervention-balloon only  08/04/2012    Procedure: PERCUTANEOUS CORONARY INTERVENTION-BALLOON ONLY;  Surgeon: [name], MD;  Location[facility] CATH LAB;  Service: Cardiovascular;;  . Percutaneous coronary rotoblator intervention (pci-r) N/A 08/11/2012    Procedure: PERCUTANEOUS CORONARY ROTOBLATOR INTERVENTION (PCI-R);  Surgeon: [name], MD;  Location: [facility] CATH LAB;  Service: Cardiovascular;  Laterality: N/A;  . Left heart catheterization with coronary angiogram N/A 11/12/2012    Procedure: LEFT HEART CATHETERIZATION WITH CORONARY ANGIOGRAM;  Surgeon: [name], MD;  Location: [facility] CATH LAB;  Service:  Cardiovascular;  Laterality: N/A;  . Left heart catheterization with coronary angiogram N/A 10/07/2013    Procedure: LEFT HEART CATHETERIZATION WITH CORONARY ANGIOGRAM;  Surgeon: [name], MD;  Location: [facility] CATH LAB;  Service: Cardiovascular;  Laterality: N/A;  . Percutaneous coronary stent intervention (pci-s)  10/07/2013    Procedure: PERCUTANEOUS CORONARY STENT INTERVENTION (PCI-S);  Surgeon: [name], MD;  Location: [facility] CATH LAB;  Service: Cardiovascular;;  . Fractional flow reserve wire  10/07/2013    Procedure: FRACTIONAL FLOW RESERVE WIRE;  Surgeon: [name], MD;  Location: [facility] CATH LAB;  Service: Cardiovascular;;  . Left heart catheterization with coronary angiogram N/A 12/14/2013    Procedure: LEFT HEART CATHETERIZATION WITH CORONARY ANGIOGRAM;  Surgeon: Sinclair Grooms, MD;  Location: [facility] CATH LAB;  Service: Cardiovascular;  Laterality: N/A;   Family History  Problem Relation Age of Onset  . Heart attack Father   . Heart attack Brother   . Stroke Mother   . Diabetes Brother   . Diabetes Father   . Hypertension Brother   . Hypertension Father   . Kidney failure Brother    History  Substance Use Topics  . Smoking status: Former Smoker -- 1.00 packs/day for 32 years    Types: Cigarettes    Quit date: 02/03/1974  . Smokeless tobacco: Never Used  . Alcohol Use: 3.0 oz/week    5 Glasses of wine per week     Comment: 11/12/2012 "4oz wine w/dinner 5 days/wk"   OB History    No data available     Review of Systems  All other systems reviewed and are negative.     Allergies  Bee venom; Amoxicillin; and Other  Home Medications   Prior to Admission medications   Medication Sig Start Date End Date Taking? Authorizing Provider  acetaminophen (TYLENOL) 500 MG tablet Take 500 mg by mouth every 8 (eight) hours as needed.    Yes Historical Provider, MD  atorvastatin (LIPITOR) 80 MG tablet TAKE 1 TABLET (80 MG TOTAL) BY MOUTH EVERY MORNING. 12/20/13   Yes [name], MD  Calcium Carb-Cholecalciferol (CALTRATE 600+D) 600-800 MG-UNIT TABS Take 1 tablet by mouth every morning.    Yes Historical Provider, MD  clopidogrel (PLAVIX) 75 MG tablet TAKE 1 TABLET BY MOUTH EVERY DAY WITH BREAKFAST 12/20/13  Yes [name], MD  Coenzyme Q-10 100 MG capsule Take 100 mg by mouth daily.    Yes Historical Provider, MD  furosemide (LASIX) 40 MG tablet TAKE 2 TABLETS BY MOUTH EVERY MORNING AND TAKE 1 TABLET EVERY EVENING AS NEEDED 12/06/13  Yes [name], MD  isosorbide mononitrate (IMDUR) 60 MG 24 hr tablet Take 0.5-1 tablets (30-60 mg total) by mouth 2 (two) times daily. 60mg  every morning and 30mg  every evening 12/08/13  Yes [name], MD  lisinopril (PRINIVIL,ZESTRIL) 5 MG tablet Take 5 mg by mouth 2 (two) times daily.  Yes Historical Provider, MD  metoprolol tartrate (LOPRESSOR) 100 MG tablet Take 1 tablet (100 mg total) by mouth 2 (two) times daily. 12/15/13  Yes Eileen Stanford, PA-C  Multiple Vitamin (MULTIVITAMIN WITH MINERALS) TABS tablet Take 1 tablet by mouth every morning.   Yes Historical Provider, MD  Multiple Vitamins-Minerals (OCUVITE PRESERVISION) TABS Take 1 tablet by mouth daily.    Yes Historical Provider, MD  nitroGLYCERIN (NITROSTAT) 0.4 MG SL tablet Place 0.4 mg under the tongue every 5 (five) minutes as needed for chest pain.   Yes Historical Provider, MD  Omega-3 Fatty Acids (FISH OIL PO) Take 1,200 mg by mouth daily.    Yes Historical Provider, MD  warfarin (COUMADIN) 2 MG tablet Take as directed by Coumadin clinic Patient taking differently: Take 1-2 mg by mouth daily. Take 2mg  daily except 1mg  on Tuesday 10/14/13  Yes [name], MD  diltiazem (CARDIZEM CD) 360 MG 24 hr capsule Take 1 capsule (360 mg total) by mouth daily. 01/06/14   Eileen Stanford, PA-C  potassium chloride SA (K-DUR,KLOR-CON) 20 MEQ tablet Take 3 tablets (60 mEq total) by mouth daily. 01/06/14   [name], MD   spironolactone (ALDACTONE) 12.5 mg TABS tablet Take 0.5 tablets (12.5 mg total) by mouth daily. 01/06/14   Eileen Stanford, PA-C   BP 108/60 mmHg  Pulse 57  Temp(Src) 98.4 F (36.9 C) (Oral)  Resp 18  Ht 5' (1.524 m)  Wt 169 lb 8 oz (76.885 kg)  BMI 33.10 kg/m2  SpO2 99% Physical Exam Physical Exam  Nursing note and vitals reviewed. Constitutional: She is oriented to person, place, and time. She appears well-developed and well-nourished. No distress.  HENT:  Head: Normocephalic and atraumatic.  Eyes: Pupils are equal, round, and reactive to light.  Neck: Normal range of motion.  Cardiovascular: Normal rate and intact distal pulses.   Pulmonary/Chest: No respiratory distress.  Abdominal: Normal appearance. She exhibits no distension.  Musculoskeletal: Normal range of motion.  Neurological: She is alert and oriented to person, place, and time. No cranial nerve deficit.  Skin: Skin is warm and dry. No rash noted.  Psychiatric: She has a normal mood and affect. Her behavior is normal.   ED Course  Procedures (including critical care time) Labs Review Labs Reviewed  PRO B NATRIURETIC PEPTIDE - Abnormal; Notable for the following:    Pro B Natriuretic peptide (BNP) 1341.0 (*)    All other components within normal limits  TROPONIN I - Abnormal; Notable for the following:    Troponin I 0.32 (*)    All other components within normal limits  PROTIME-INR - Abnormal; Notable for the following:    Prothrombin Time 22.3 (*)    INR 1.94 (*)    All other components within normal limits  BASIC METABOLIC PANEL - Abnormal; Notable for the following:    Glucose, Bld 108 (*)    BUN 30 (*)    Creatinine, Ser 1.18 (*)    GFR calc non Af Amer 42 (*)    GFR calc Af Amer 49 (*)    All other components within normal limits  PROTIME-INR - Abnormal; Notable for the following:    Prothrombin Time 24.7 (*)    INR 2.21 (*)    All other components within normal limits  I-STAT CHEM 8, ED -  Abnormal; Notable for the following:    Potassium 3.2 (*)    Glucose, Bld 100 (*)    Calcium, Ion 1.12 (*)  All other components within normal limits  TROPONIN I  TROPONIN I  CBC  LIPID PANEL  I-STAT TROPOININ, ED    Imaging Review No results found.   EKG Interpretation   Date/Time:  Thursday January 05 2014 09:52:14 EST Ventricular Rate:  90 PR Interval:    QRS Duration: 170 QT Interval:  454 QTC Calculation: 556 R Axis:   -168 Text Interpretation:  Atrial flutter Nonspecific intraventricular  conduction delay Lateral infarct, old No significant change since last  tracing except tachycardia has resolved since last tracing Confirmed by  Anneke Cundy  MD, Yossi Hinchman ([id number]) on 01/05/2014 9:58:49 AM      MDM   Final diagnoses:  Chest pain        Dot Lanes, MD 01/13/14 1024

## 2014-01-05 NOTE — Progress Notes (Signed)
Pt HR pacing at 50.  Per pt her baseline HR is in the 100's.  Nell Range, PA notified and no new orders received.  Will continue to closely monitor.

## 2014-01-05 NOTE — Progress Notes (Signed)
ANTICOAGULATION CONSULT NOTE - Initial Consult  Pharmacy Consult for Coumadin Indication: atrial fibrillation  Allergies  Allergen Reactions  . Bee Venom Anaphylaxis  . Amoxicillin Diarrhea and Nausea And Vomiting  . Other     Pain medications cause severe vomiting.    Patient Measurements: Height: 5' (152.4 cm) Weight: 169 lb 8 oz (76.885 kg) IBW/kg (Calculated) : 45.5 Heparin Dosing Weight:   Vital Signs: Temp: 97.8 F (36.6 C) (12/03 1249) Temp Source: Oral (12/03 1249) BP: 133/91 mmHg (12/03 1249) Pulse Rate: 108 (12/03 1249)  Labs:  Recent Labs  01/05/14 1032 01/05/14 1426  HGB 14.3  --   HCT 42.0  --   LABPROT  --  22.3*  INR  --  1.94*  CREATININE 0.90  --     Estimated Creatinine Clearance: 45 mL/min (by C-G formula based on Cr of 0.9).   Medical History: Past Medical History  Diagnosis Date  . Hypertension   . Hyperlipidemia   . Obesity   . Diverticulitis   . Peripheral vascular disease   . Varicose veins   . Granulosa cell carcinoma     abd; last episode was in 2009  . Tachycardia-bradycardia syndrome     a. s/p leadless pacemaker ([name]) implanted by Dr [name]  . Atrial fibrillation 2013  . OSA on CPAP   . Combined systolic and diastolic heart failure, NYHA class 3   . Coronary artery disease     a. s/p multiple stents  b. LHC 12/14/13 with sig CAD with TO of OM1, 70-80% occusion OM2, 50-70% mLCx, 50-70% pLAD (recent FFR 0.83), diffusely diseased RCA, widely patent stetns in pLAD and LCx.  Marland Kitchen LBBB (left bundle branch block)     chronic  . Pacemaker   . Myocardial infarction 2002    Medications:  Prescriptions prior to admission  Medication Sig Dispense Refill Last Dose  . acetaminophen (TYLENOL) 500 MG tablet Take 500 mg by mouth every 8 (eight) hours as needed.    unknown  . atorvastatin (LIPITOR) 80 MG tablet TAKE 1 TABLET (80 MG TOTAL) BY MOUTH EVERY MORNING. 30 tablet 5 01/05/2014 at Unknown time  . Calcium Carb-Cholecalciferol  (CALTRATE 600+D) 600-800 MG-UNIT TABS Take 1 tablet by mouth every morning.    01/04/2014 at Unknown time  . clopidogrel (PLAVIX) 75 MG tablet TAKE 1 TABLET BY MOUTH EVERY DAY WITH BREAKFAST 30 tablet 3 01/04/2014 at Unknown time  . Coenzyme Q-10 100 MG capsule Take 100 mg by mouth daily.    01/04/2014 at Unknown time  . diltiazem (CARDIZEM CD) 240 MG 24 hr capsule TAKE ONE CAPSULE BY MOUTH EVERY DAY 30 capsule 6 01/05/2014 at Unknown time  . furosemide (LASIX) 40 MG tablet TAKE 2 TABLETS BY MOUTH EVERY MORNING AND TAKE 1 TABLET EVERY EVENING AS NEEDED 90 tablet 1 01/05/2014 at Unknown time  . isosorbide mononitrate (IMDUR) 60 MG 24 hr tablet Take 0.5-1 tablets (30-60 mg total) by mouth 2 (two) times daily. 60mg  every morning and 30mg  every evening 45 tablet 1 01/05/2014 at Unknown time  . lisinopril (PRINIVIL,ZESTRIL) 5 MG tablet Take 5 mg by mouth 2 (two) times daily.   01/05/2014 at Unknown time  . metoprolol tartrate (LOPRESSOR) 100 MG tablet Take 1 tablet (100 mg total) by mouth 2 (two) times daily. 60 tablet 11 01/05/2014 at 0830  . Multiple Vitamin (MULTIVITAMIN WITH MINERALS) TABS tablet Take 1 tablet by mouth every morning.   01/04/2014 at Unknown time  . Multiple Vitamins-Minerals (OCUVITE PRESERVISION)  TABS Take 1 tablet by mouth daily.    01/04/2014 at Unknown time  . nitroGLYCERIN (NITROSTAT) 0.4 MG SL tablet Place 0.4 mg under the tongue every 5 (five) minutes as needed for chest pain.   01/05/2014 at Unknown time  . Omega-3 Fatty Acids (FISH OIL PO) Take 1,200 mg by mouth daily.    01/04/2014 at Unknown time  . potassium chloride SA (K-DUR,KLOR-CON) 20 MEQ tablet Take 3 tablets (60 mEq total) by mouth daily. 90 tablet 3 01/04/2014 at Unknown time  . warfarin (COUMADIN) 2 MG tablet Take as directed by Coumadin clinic (Patient taking differently: Take 1-2 mg by mouth daily. Take 2mg  daily except 1mg  on Tuesday an dFriday) 30 tablet 3 01/04/2014 at Unknown time    Assessment: 83yof to continue  Coumadin for Afib. Admit INR (1.94) is just below goal range. Per discussion with patient, she was told to hold 11/30 dose due to INR of 3.4. She has continued her PTA regimen since then (2mg  daily except 1mg  on Tuesdays) - will give 2mg  dose tonight and follow-up AM labs. - H/H wnl - No bleeding reported per patient  Goal of Therapy:  INR 2-3   Plan:  1. Coumadin 2mg  PO x 1 today 2. Daily INR  Earleen Newport  [phone] 01/05/2014,3:35 PM

## 2014-01-05 NOTE — ED Notes (Signed)
X-ray at bedside

## 2014-01-05 NOTE — ED Notes (Signed)
Lab at bedside

## 2014-01-05 NOTE — Progress Notes (Signed)
CRITICAL VALUE ALERT  Critical value received:  Troponin I 0.32  Date of notification:  01/05/2014  Time of notification:  2025  Critical value read back: yes  Nurse who received alert:  Kerrie Buffalo RN  MD notified (1st page): Dr. Philbert Riser  Time of first page:  2029  MD notified (2nd page):  Time of second page:  Responding MD:  Dr. Philbert Riser  Time MD responded:  2035- no new orders; will continue to monitor

## 2014-01-05 NOTE — ED Notes (Signed)
Per ems, pt started to have CP this morning, took nitro but 30 min later her pain would come back; pt took 3 nitro at home and when pain came back called ems; ems gave one more nitro which has relieved her pain; pt also received 324mg  asa in route

## 2014-01-05 NOTE — H&P (Signed)
Admit date: 01/05/2014 Primary Physician  Dorian Heckle, MD Primary Cardiologist  Irish Lack  CC: Chest pain  HPI: 80 year old female with coronary artery disease, multiple stents, most recent cardiac catheterization on 12/14/13 by Dr. Tamala Julian demonstrating the following:  1. Significant coronary artery disease with total occlusion of the first obtuse marginal, 70-80% second obtuse marginal stenosis, segmental diffuse 50-70% mid circumflex, 50-70% proximal LAD (recent FFR 0.83), diffusely diseased but non-critical RCA, with widely patent stents in the proximal LAD and circumflex. 2. No change in the angiographic appearance of the coronaries since the September procedure. 3. Left ventricular systolic dysfunction, with EF 40-50%  Plan was to enhance rate control by increasing metoprolol to 100 mg twice a day.  Also had hypokalemia supplemented.  Prior to catheterization as above, in July 2014 she had multivessel rotational atherectomy and stent placement in finish rehabilitation without any issues. She had DES to an area of in-stent restenosis from old bare-metal stent from 2005.  Today, she is here with chest discomfort starting this morning, took nitroglycerin but 30 minutes later the pain would return. She called EMS after 3 nitroglycerin. Moderate in intensity. Worried her.   Thus far, labs demonstrate point-of-care troponin that is normal, potassium of 3.2 as was seen on previous hospitalization, creatinine of 0.9  Chest x-ray personally reviewed shows mild cardiomegaly, mild edema, leadless pacemaker.  EKG demonstrates underlying atrial flutter heart rate in the 90s with left bundle branch block morphology. No paced rhythm noted.   PMH:   Past Medical History  Diagnosis Date  . Hypertension   . Hyperlipidemia   . Obesity   . Diverticulitis   . Peripheral vascular disease   . Varicose veins   . Granulosa cell carcinoma     abd; last episode was in 2009  .  Tachycardia-bradycardia syndrome     a. s/p leadless pacemaker ([name]) implanted by Dr [name]  . Atrial fibrillation 2013  . OSA on CPAP   . Combined systolic and diastolic heart failure, NYHA class 3   . Coronary artery disease     a. s/p multiple stents  b. LHC 12/14/13 with sig CAD with TO of OM1, 70-80% occusion OM2, 50-70% mLCx, 50-70% pLAD (recent FFR 0.83), diffusely diseased RCA, widely patent stetns in pLAD and LCx.  Marland Kitchen LBBB (left bundle branch block)     chronic  . Pacemaker     PSH:   Past Surgical History  Procedure Laterality Date  . Cholecystectomy  1980's  . Salivary gland surgery  2000's    "had a little lump removed; granulosa related; it was benign" (08/11/2012)  . US echocardiography  11/21/2003    EF 55-60%  . Cardiovascular stress test  03/23/2009    EF 72%  . Abdominal hysterectomy    . Colon surgery  2004    colectomy for diverticulosis  . Granulosa tumor excision  2000; 2003; 2004; 2007    "all in my abdomen including small intestines, outside my ?uterus/etc" (08/11/2012)  . Diagnostic laparoscopy      gallbladder removal; abdominal hernia repair  . Tee without cardioversion  12/31/2011    Procedure: TRANSESOPHAGEAL ECHOCARDIOGRAM (TEE);  Surgeon: [name], MD;  Location: [facility];  Service: Cardiovascular;  Laterality: N/A;  . Cardioversion  12/31/2011    Procedure: CARDIOVERSION;  Surgeon: [name], MD;  Location: [facility];  Service: Cardiovascular;  Laterality: N/A;  . Pacemaker insertion  2/11/4    [name] (SJM) leadless pacemaker (LEADLESS II STUDY PATEINT)  .  Cardiac catheterization  09/03/2007    EF 70%; Failed attempt at PCI to OM  . Cardiac catheterization  11/01/2003    EF 70%  . Coronary angioplasty    . Coronary angioplasty with stent placement  2000; 08/11/2012; 11/12/2012    3 + 2 LAD & CFX; 2nd CFX stent 11/12/2012  . Insert / replace / remove pacemaker  03/16/2012    [name] (SJM) leadless pacemaker (LEADLESS  II STUDY PATEINT)  . Varicose vein surgery Bilateral 1977  . Hernia repair  2005    "laparoscopic"  . Cataract extraction, bilateral  2015   Allergies:  Bee venom; Amoxicillin; and Other Prior to Admit Meds:   Prior to Admission medications   Medication Sig Start Date End Date Taking? Authorizing Provider  acetaminophen (TYLENOL) 500 MG tablet Take 500 mg by mouth at bedtime.    Historical Provider, MD  atorvastatin (LIPITOR) 80 MG tablet TAKE 1 TABLET (80 MG TOTAL) BY MOUTH EVERY MORNING. 12/20/13   [name], MD  Calcium Carb-Cholecalciferol (CALTRATE 600+D) 600-800 MG-UNIT TABS Take 1 tablet by mouth every morning.     Historical Provider, MD  clopidogrel (PLAVIX) 75 MG tablet TAKE 1 TABLET BY MOUTH EVERY DAY WITH BREAKFAST 12/20/13   [name], MD  Coenzyme Q-10 100 MG capsule Take 100 mg by mouth daily.     Historical Provider, MD  diltiazem (CARDIZEM CD) 240 MG 24 hr capsule TAKE ONE CAPSULE BY MOUTH EVERY DAY 12/20/13   [name], MD  furosemide (LASIX) 40 MG tablet TAKE 2 TABLETS BY MOUTH EVERY MORNING AND TAKE 1 TABLET EVERY EVENING AS NEEDED 12/06/13   [name], MD  isosorbide mononitrate (IMDUR) 60 MG 24 hr tablet Take 0.5-1 tablets (30-60 mg total) by mouth 2 (two) times daily. 60mg  every morning and 30mg  every evening 12/08/13   [name], MD  lisinopril (PRINIVIL,ZESTRIL) 5 MG tablet Take 5 mg by mouth 2 (two) times daily.    Historical Provider, MD  metoprolol tartrate (LOPRESSOR) 100 MG tablet Take 1 tablet (100 mg total) by mouth 2 (two) times daily. 12/15/13   Eileen Stanford, PA-C  metoprolol tartrate (LOPRESSOR) 25 MG tablet  12/08/13   Historical Provider, MD  Multiple Vitamin (MULTIVITAMIN WITH MINERALS) TABS tablet Take 1 tablet by mouth every morning.    Historical Provider, MD  Multiple Vitamins-Minerals (OCUVITE PRESERVISION) TABS Take 1 tablet by mouth daily.     Historical Provider, MD  nitroGLYCERIN (NITROSTAT) 0.4  MG SL tablet Place 0.4 mg under the tongue every 5 (five) minutes as needed for chest pain.    Historical Provider, MD  Omega-3 Fatty Acids (FISH OIL PO) Take 1,200 mg by mouth daily.     Historical Provider, MD  potassium chloride SA (K-DUR,KLOR-CON) 20 MEQ tablet Take 3 tablets (60 mEq total) by mouth daily. 09/23/13   [name], MD  warfarin (COUMADIN) 2 MG tablet Take as directed by Coumadin clinic Patient taking differently: Take 1-2 mg by mouth daily. Take 2mg  daily except Tuesday, take 1mg  10/14/13   [name], MD   Fam HX:    Family History  Problem Relation Age of Onset  . Heart attack Father   . Heart attack Brother   . Stroke Mother   . Diabetes Brother   . Diabetes Father   . Hypertension Brother   . Hypertension Father   . Kidney failure Brother    Family History: The patient's family history includes Diabetes  in her brother and father; Heart attack in her brother and father; Hypertension in her brother and father; Kidney failure in her brother; Stroke in her mother.  Social HX:    History   Social History  . Marital Status: Married    Spouse Name: N/A    Number of Children: N/A  . Years of Education: N/A   Occupational History  . Retired    Social History Main Topics  . Smoking status: Former Smoker -- 1.00 packs/day for 32 years    Types: Cigarettes    Quit date: 02/03/1974  . Smokeless tobacco: Never Used  . Alcohol Use: 3.0 oz/week    5 Glasses of wine per week     Comment: 11/12/2012 "4oz wine w/dinner 5 days/wk"  . Drug Use: No  . Sexual Activity: No   Other Topics Concern  . Not on file   Social History Narrative   Lives with family.     ROS: Denies any fevers, chills, orthopnea, PND, syncope, bleeding, strokelike symptoms All 11 ROS were addressed and are negative except what is stated in the HPI   Physical Exam: Blood pressure 141/85, pulse 96, temperature 97.9 F (36.6 C), temperature source Oral, resp. rate 22, height 5'  (1.524 m), weight 169 lb 8 oz (76.885 kg), SpO2 96 %.   General: Well developed, well nourished, in no acute distress Head: Eyes PERRLA, No xanthomas.   Normal cephalic and atramatic  Lungs:  Clear bilaterally to auscultation and percussion. Normal respiratory effort. No wheezes, no rales. Heart:  Irregularly irregular Pulses are 2+ & equal. No murmurs, rubs or gallops.             No carotid bruit. No JVD.  No abdominal bruits. Abdomen: Bowel sounds are positive, abdomen soft and non-tender without masses or                 Hernia's noted. No hepatosplenomegaly. Msk:  Back normal, normal gait. Normal strength and tone for age. Extremities:  No clubbing, cyanosis or edema.  DP +1 Neuro: Alert and oriented X 3, non-focal, MAE x 4 GU: Deferred Rectal: Deferred Psych:  Good affect, responds appropriately         Labs:   Lab Results  Component Value Date   WBC 6.3 12/15/2013   HGB 14.3 01/05/2014   HCT 42.0 01/05/2014   MCV 92.6 12/15/2013   PLT 300 12/15/2013    Recent Labs Lab 01/05/14 1032  NA 144  K 3.2*  CL 102  BUN 19  CREATININE 0.90  GLUCOSE 100*    Lab Results  Component Value Date   CHOL 131 10/05/2013   HDL 61 10/05/2013   LDLCALC 51 10/05/2013   TRIG 93 10/05/2013      Radiology:  Dg Chest Portable 1 View  01/05/2014   CLINICAL DATA:  Chest pain  EXAM: PORTABLE CHEST - 1 VIEW  COMPARISON:  12/31/2013  FINDINGS: Cardiac enlargement with mild heart failure. Pulmonary vascular congestion without edema or effusion.  Mild bibasilar atelectasis.  Cardiac loop recorder noted.  Left coronary stent noted.  IMPRESSION: Mild fluid overload without edema.   Electronically Signed   By: Franchot Gallo M.D.   On: 01/05/2014 10:09   Personally viewed.   EKG:  As above in history of present illness Personally viewed.  ASSESSMENT/PLAN:   80 year old female with multiple hospitalizations secondary to chest discomfort with significant coronary artery disease status post  recent cardiac catheterization less than 30 days ago-medical  management here with chest discomfort.  1. Chest pain - chronic history of recurring episodes of chest discomfort. Continue to cycle troponin. Certainly she does have underlying coronary artery disease and previous cardiac catheterization demonstrated this however it was thought best to proceed with continued medical management with enhancement of rate control. Currently her EKG demonstrates heart rate in the 90s with no ischemic changes. She is on both diltiazem 240 mg CD as well as metoprolol 100 mg twice a day. If blood pressure allows, we could try to increase her diltiazem to 360 mg CD. I will go ahead and write for this.  As was noted by Dr. Irish Lack, we do not want to have her chronically paced however. Continue with Plavix, atorvastatin. Continue with medicine optimization.  2. Status post leadless pacemaker-personally viewed on chest x-ray. Functioning well.  3. Atrial flutter/fibrillation-currently on Coumadin for anticoagulation. Pharmacy to dose.   4. Hyperlipidemia-continue with statin.  5. Essential hypertension-currently controlled. At times, blood pressure on the low side. Monitor.  6. Obesity-continue to encourage weight loss.  7. Cardiomyopathy-ejection fraction is fluctuated from 55% September 2015 to 40% in October 2014, continue with current medications.  8. Hypokalemia-I will add low-dose spironolactone 12.5 mg to her drug regimen to help her with what appears to be chronic hypokalemia. I will give extra 40 mEq of potassium in addition to her 60 that she normally takes. Recheck basic metabolic profile in the morning. Watch for signs of hyperkalemia with administration of spironolactone.  9. Hip pain - eager to start PT.   Candee Furbish, MD  01/05/2014  11:05 AM

## 2014-01-06 ENCOUNTER — Other Ambulatory Visit: Payer: Self-pay | Admitting: *Deleted

## 2014-01-06 DIAGNOSIS — E876 Hypokalemia: Secondary | ICD-10-CM | POA: Diagnosis not present

## 2014-01-06 DIAGNOSIS — E669 Obesity, unspecified: Secondary | ICD-10-CM | POA: Diagnosis not present

## 2014-01-06 DIAGNOSIS — I10 Essential (primary) hypertension: Secondary | ICD-10-CM | POA: Diagnosis not present

## 2014-01-06 DIAGNOSIS — E785 Hyperlipidemia, unspecified: Secondary | ICD-10-CM

## 2014-01-06 DIAGNOSIS — R079 Chest pain, unspecified: Secondary | ICD-10-CM | POA: Diagnosis not present

## 2014-01-06 DIAGNOSIS — R0789 Other chest pain: Secondary | ICD-10-CM | POA: Diagnosis not present

## 2014-01-06 LAB — LIPID PANEL
Cholesterol: 125 mg/dL (ref 0–200)
HDL: 64 mg/dL (ref >39)
LDL CALC: 47 mg/dL (ref 0–99)
TRIGLYCERIDES: 70 mg/dL (ref <150)
Total CHOL/HDL Ratio: 2 RATIO
VLDL: 14 mg/dL (ref 0–40)

## 2014-01-06 LAB — PROTIME-INR
INR: 2.21 — AB (ref 0.00–1.49)
Prothrombin Time: 24.7 seconds — ABNORMAL HIGH (ref 11.6–15.2)

## 2014-01-06 LAB — BASIC METABOLIC PANEL
Anion gap: 11 (ref 5–15)
BUN: 30 mg/dL — AB (ref 6–23)
CALCIUM: 8.6 mg/dL (ref 8.4–10.5)
CO2: 28 mEq/L (ref 19–32)
Chloride: 103 mEq/L (ref 96–112)
Creatinine, Ser: 1.18 mg/dL — ABNORMAL HIGH (ref 0.50–1.10)
GFR calc Af Amer: 49 mL/min — ABNORMAL LOW (ref >90)
GFR calc non Af Amer: 42 mL/min — ABNORMAL LOW (ref >90)
GLUCOSE: 108 mg/dL — AB (ref 70–99)
Potassium: 4.5 mEq/L (ref 3.7–5.3)
Sodium: 142 mEq/L (ref 137–147)

## 2014-01-06 LAB — CBC
HEMATOCRIT: 38.9 % (ref 36.0–46.0)
HEMOGLOBIN: 12.7 g/dL (ref 12.0–15.0)
MCH: 30.9 pg (ref 26.0–34.0)
MCHC: 32.6 g/dL (ref 30.0–36.0)
MCV: 94.6 fL (ref 78.0–100.0)
Platelets: 192 10*3/uL (ref 150–400)
RBC: 4.11 MIL/uL (ref 3.87–5.11)
RDW: 14.4 % (ref 11.5–15.5)
WBC: 5.9 10*3/uL (ref 4.0–10.5)

## 2014-01-06 LAB — TROPONIN I

## 2014-01-06 MED ORDER — SPIRONOLACTONE 12.5 MG HALF TABLET: 12.5000 mg | ORAL_TABLET | Freq: Every day | ORAL | Status: DC

## 2014-01-06 MED ORDER — DILTIAZEM HCL ER COATED BEADS 360 MG PO CP24: 360.0000 mg | ORAL_CAPSULE | Freq: Every day | ORAL | Status: DC

## 2014-01-06 MED ORDER — WARFARIN SODIUM 1 MG PO TABS: 1.0000 mg | ORAL_TABLET | Freq: Once | ORAL | Status: DC

## 2014-01-06 MED ORDER — POTASSIUM CHLORIDE CRYS ER 20 MEQ PO TBCR: 60.0000 meq | EXTENDED_RELEASE_TABLET | Freq: Every day | ORAL | Status: DC

## 2014-01-06 NOTE — Discharge Summary (Signed)
Discharge Summary   Patient ID: Patty [name] MRN: [id number], DOB/AGE: Jul 18, 1930 79 y.o. Admit date: 01/05/2014 D/C date:     01/06/2014  Primary Cardiologist: Dr. Irish Lack  Principal Problem:   Chest pain Active Problems:   CAD (coronary artery disease)   HTN (hypertension)   Hyperlipidemia   Obesity   Pacemaker   Atrial flutter, unspecified   Hypokalemia   Admission Dates: 01/05/14-01/06/14 Discharge Diagnosis: chest pain- s/p medication adjustments and no further ischemic work up.  HPI: Patty [name] is a 80 y.o. female with a history of CAD s/p multiple stents, LBBB, chronic combined s/d CHF, PVD, HTN, HLD, tachy-brady s/p PPM, and OSA on CPAP who presented to [facility] on 01/05/14 with chest pain.   She has a significant CAD history with chronic chest pain. Her most recent cardiac catheterization on 12/14/13 demonstrating the following:  1. Significant coronary artery disease with total occlusion of the first obtuse marginal, 70-80% second obtuse marginal stenosis, segmental diffuse 50-70% mid circumflex, 50-70% proximal LAD (recent FFR 0.83), diffusely diseased but non-critical RCA, with widely patent stents in the proximal LAD and circumflex.  2. No change in the angiographic appearance of the coronaries since the September procedure.  3. Left ventricular systolic dysfunction, with EF 40-50% The plan at that time was to enhance rate control by increasing metoprolol to 100 mg twice a day. Prior to catheterization as above, in 08/2012 she had multivessel rotational atherectomy and stent placement. She had DES to an area of in-stent restenosis from old bare-metal stent from 2005.  She presented to [facility] on 01/05/14 with chest discomfort starting that morning. She took SL NTG but 30 minutes later the pain returned. She called EMS after 3 nitroglycerin.   Thus far, labs demonstrate point-of-care troponin that is normal, potassium of 3.2 as was seen on previous hospitalization, creatinine  of 0.9. EKG demonstrated underlying atrial flutter w/ heart rate in the 90s with left bundle branch block morphology. No paced rhythm noted.   Hospital Course  Chest pain - chronic history of recurring episodes of chest discomfort.  -- Troponin < 0.30 --> 2.59 --> < 5.63 -- Certainly she does have underlying coronary artery disease and previous cardiac catheterization demonstrated this however it was thought best to proceed with continued medical management with enhancement of rate control.  -- Her diltiazem CD was increased from 240mg  --> 360mg  during this admission -- She has not had any further chest pain and wants to go home -- Continue with Plavix, BB and atorvastatin and imdur. No ASA as she is on plavix and coumadin.   Atrial flutter/fibrillation-currently on Coumadin for anticoagulation.  -- INR 2.21 -- Her dilt CD was increased from 240 mg to 360mg . Continued on metoprolol 100 mg twice a day. -- As was noted by Dr. Irish Lack, we do not want to have her chronically paced however.   Tachy-brady- s/p leadless pacemaker. Functioning well.  Hyperlipidemia-continue with statin.  Essential hypertension- well controlled on current medications  Obesity-continue to encourage weight loss.  Cardiomyopathy- EF has fluctuated from 55% 12/2013 to 40% in 11/2012  -- Continue with BB and ACE. Arlyce Harman added on this admission.   Hypokalemia- appears to be chronic hypokalemia. Improved on low-dose spironolactone 12.5 mg  -- K 4.5 on discharge after addition of spiro and additional K supplementation with Kdur. -- Follow up BMET at f/u appt  The patient has had an uncomplicated hospital course and is recovering well.  She has been seen by  Dr. Harrington Challenger today and deemed ready for discharge home. All follow-up appointments have been scheduled. Discharge medications are listed below.   Discharge Vitals: Blood pressure 108/60, pulse 57, temperature 98.4 F (36.9 C), temperature source Oral, resp. rate 18,  height 5' (1.524 m), weight 169 lb 8 oz (76.885 kg), SpO2 99 %.  Labs: Lab Results  Component Value Date   WBC 5.9 01/06/2014   HGB 12.7 01/06/2014   HCT 38.9 01/06/2014   MCV 94.6 01/06/2014   PLT 192 01/06/2014     Recent Labs Lab 01/06/14 0021  NA 142  K 4.5  CL 103  CO2 28  BUN 30*  CREATININE 1.18*  CALCIUM 8.6  GLUCOSE 108*    Recent Labs  01/05/14 1246 01/05/14 1912 01/06/14 0021  TROPONINI <0.30 0.32* <0.30   Lab Results  Component Value Date   CHOL 125 01/06/2014   HDL 64 01/06/2014   LDLCALC 47 01/06/2014   TRIG 70 01/06/2014     Diagnostic Studies/Procedures   Dg Chest 2 View  12/11/2013   CLINICAL DATA:  80 year old female with several day history of mid chest pain  EXAM: CHEST  2 VIEW  COMPARISON:  Prior chest x-ray 10/04/2013  FINDINGS: Stable cardiomegaly with left heart prominence. Metallic stent noted along the course of the LAD. Implantable loop recorder projects over the left chest. No focal airspace consolidation, pulmonary edema, pleural effusion or pneumothorax. Surgical clips in the right upper quadrant suggest prior cholecystectomy. Atherosclerotic calcifications throughout the aorta. No acute osseous abnormality.  IMPRESSION: 1. No acute cardiopulmonary disease. 2. Stable cardiomegaly.   Electronically Signed   By: Jacqulynn Cadet M.D.   On: 12/11/2013 12:22   Dg Chest Portable 1 View  01/05/2014   CLINICAL DATA:  Chest pain  EXAM: PORTABLE CHEST - 1 VIEW  COMPARISON:  12/31/2013  FINDINGS: Cardiac enlargement with mild heart failure. Pulmonary vascular congestion without edema or effusion.  Mild bibasilar atelectasis.  Cardiac loop recorder noted.  Left coronary stent noted.  IMPRESSION: Mild fluid overload without edema.   Electronically Signed   By: Franchot Gallo M.D.   On: 01/05/2014 10:09    Discharge Medications     Medication List    TAKE these medications        acetaminophen 500 MG tablet  Commonly known as:  TYLENOL    Take 500 mg by mouth every 8 (eight) hours as needed.     atorvastatin 80 MG tablet  Commonly known as:  LIPITOR  TAKE 1 TABLET (80 MG TOTAL) BY MOUTH EVERY MORNING.     CALTRATE 600+D 600-800 MG-UNIT Tabs  Generic drug:  Calcium Carb-Cholecalciferol  Take 1 tablet by mouth every morning.     clopidogrel 75 MG tablet  Commonly known as:  PLAVIX  TAKE 1 TABLET BY MOUTH EVERY DAY WITH BREAKFAST     Coenzyme Q-10 100 MG capsule  Take 100 mg by mouth daily.     diltiazem 360 MG 24 hr capsule  Commonly known as:  CARDIZEM CD  Take 1 capsule (360 mg total) by mouth daily.     FISH OIL PO  Take 1,200 mg by mouth daily.     furosemide 40 MG tablet  Commonly known as:  LASIX  TAKE 2 TABLETS BY MOUTH EVERY MORNING AND TAKE 1 TABLET EVERY EVENING AS NEEDED     isosorbide mononitrate 60 MG 24 hr tablet  Commonly known as:  IMDUR  Take 0.5-1 tablets (30-60 mg total) by mouth  2 (two) times daily. 60mg  every morning and 30mg  every evening     lisinopril 5 MG tablet  Commonly known as:  PRINIVIL,ZESTRIL  Take 5 mg by mouth 2 (two) times daily.     metoprolol 100 MG tablet  Commonly known as:  LOPRESSOR  Take 1 tablet (100 mg total) by mouth 2 (two) times daily.     multivitamin with minerals Tabs tablet  Take 1 tablet by mouth every morning.     nitroGLYCERIN 0.4 MG SL tablet  Commonly known as:  NITROSTAT  Place 0.4 mg under the tongue every 5 (five) minutes as needed for chest pain.     OCUVITE PRESERVISION Tabs  Take 1 tablet by mouth daily.     potassium chloride SA 20 MEQ tablet  Commonly known as:  K-DUR,KLOR-CON  Take 3 tablets (60 mEq total) by mouth daily.     spironolactone 12.5 mg Tabs tablet  Commonly known as:  ALDACTONE  Take 0.5 tablets (12.5 mg total) by mouth daily.     warfarin 2 MG tablet  Commonly known as:  COUMADIN  Take as directed by Coumadin clinic        Disposition   The patient will be discharged in stable condition to  home.  Follow-up Information    Follow up with Truitt Merle, NP On 01/13/2014.   Specialty:  Nurse Practitioner   Why:  @11am     Contact information:   [address]  [phone]         Duration of Discharge Encounter: Greater than 30 minutes including physician and PA time.  Mable Fill R PA-C 01/06/2014, 2:54 PM

## 2014-01-06 NOTE — Progress Notes (Signed)
Medicare Important Message given? YES   (If response is "NO", the following Medicare IM given date fields will be blank)   Date Medicare IM given:   Medicare IM given by: Graves-Bigelow, Kamyrah Feeser  

## 2014-01-06 NOTE — Progress Notes (Signed)
Medicare Important Message given? YES   (If response is "NO", the following Medicare IM given date fields will be blank)   Date Medicare IM given:   Medicare IM given by: Graves-Bigelow, Ulas Zuercher  

## 2014-01-06 NOTE — Progress Notes (Signed)
Subjective:  Patient denies CP  No dizzines  No SOB   Objective: Filed Vitals:   01/05/14 1249 01/05/14 1727 01/05/14 2006 01/06/14 0632  BP: 133/91 116/58 112/60 152/65  Pulse: 108 51 51 49  Temp: 97.8 F (36.6 C)  97.9 F (36.6 C) 98 F (36.7 C)  TempSrc: Oral  Oral Oral  Resp: 18  18 18   Height: 5' (1.524 m)     Weight:    169 lb 8 oz (76.885 kg)  SpO2: 98% 95% 96% 96%   Weight change:   Intake/Output Summary (Last 24 hours) at 01/06/14 0820 Last data filed at 01/06/14 0600  Gross per 24 hour  Intake    483 ml  Output      0 ml  Net    483 ml    General: Alert, awake, oriented x3, in no acute distress Neck:  JVP is normal Heart: Regular rate and rhythm, without murmurs, rubs, gallops.  Lungs: Clear to auscultation.  No rales or wheezes. Exemities:  No edema.   Neuro: Grossly intact, nonfocal.  Tel  SR  Paced at times   Lab Results: Results for orders placed or performed during the hospital encounter of 01/05/14 (from the past 24 hour(s))  Pro b natriuretic peptide (BNP)     Status: Abnormal   Collection Time: 01/05/14 10:27 AM  Result Value Ref Range   Pro B Natriuretic peptide (BNP) 1341.0 (H) 0 - 450 pg/mL  I-stat troponin, ED     Status: None   Collection Time: 01/05/14 10:30 AM  Result Value Ref Range   Troponin i, poc 0.01 0.00 - 0.08 ng/mL   Comment 3          I-stat chem 8, ed     Status: Abnormal   Collection Time: 01/05/14 10:32 AM  Result Value Ref Range   Sodium 144 137 - 147 mEq/L   Potassium 3.2 (L) 3.7 - 5.3 mEq/L   Chloride 102 96 - 112 mEq/L   BUN 19 6 - 23 mg/dL   Creatinine, Ser 0.90 0.50 - 1.10 mg/dL   Glucose, Bld 100 (H) 70 - 99 mg/dL   Calcium, Ion 1.12 (L) 1.13 - 1.30 mmol/L   TCO2 26 0 - 100 mmol/L   Hemoglobin 14.3 12.0 - 15.0 g/dL   HCT 42.0 36.0 - 46.0 %  Troponin I-(serum)     Status: None   Collection Time: 01/05/14 12:46 PM  Result Value Ref Range   Troponin I <0.30 <0.30 ng/mL  Protime-INR     Status: Abnormal   Collection Time: 01/05/14  2:26 PM  Result Value Ref Range   Prothrombin Time 22.3 (H) 11.6 - 15.2 seconds   INR 1.94 (H) 0.00 - 1.49  Troponin I-(serum)     Status: Abnormal   Collection Time: 01/05/14  7:12 PM  Result Value Ref Range   Troponin I 0.32 (HH) <0.30 ng/mL  Troponin I-(serum)     Status: None   Collection Time: 01/06/14 12:21 AM  Result Value Ref Range   Troponin I <0.30 <0.30 ng/mL  CBC     Status: None   Collection Time: 01/06/14 12:21 AM  Result Value Ref Range   WBC 5.9 4.0 - 10.5 K/uL   RBC 4.11 3.87 - 5.11 MIL/uL   Hemoglobin 12.7 12.0 - 15.0 g/dL   HCT 38.9 36.0 - 46.0 %   MCV 94.6 78.0 - 100.0 fL   MCH 30.9 26.0 - 34.0 pg   MCHC  32.6 30.0 - 36.0 g/dL   RDW 14.4 11.5 - 15.5 %   Platelets 192 150 - 400 K/uL  Basic metabolic panel     Status: Abnormal   Collection Time: 01/06/14 12:21 AM  Result Value Ref Range   Sodium 142 137 - 147 mEq/L   Potassium 4.5 3.7 - 5.3 mEq/L   Chloride 103 96 - 112 mEq/L   CO2 28 19 - 32 mEq/L   Glucose, Bld 108 (H) 70 - 99 mg/dL   BUN 30 (H) 6 - 23 mg/dL   Creatinine, Ser 1.18 (H) 0.50 - 1.10 mg/dL   Calcium 8.6 8.4 - 10.5 mg/dL   GFR calc non Af Amer 42 (L) >90 mL/min   GFR calc Af Amer 49 (L) >90 mL/min   Anion gap 11 5 - 15  Protime-INR     Status: Abnormal   Collection Time: 01/06/14 12:21 AM  Result Value Ref Range   Prothrombin Time 24.7 (H) 11.6 - 15.2 seconds   INR 2.21 (H) 0.00 - 1.49  Lipid panel     Status: None   Collection Time: 01/06/14 12:21 AM  Result Value Ref Range   Cholesterol 125 0 - 200 mg/dL   Triglycerides 70 <150 mg/dL   HDL 64 >39 mg/dL   Total CHOL/HDL Ratio 2.0 RATIO   VLDL 14 0 - 40 mg/dL   LDL Cholesterol 47 0 - 99 mg/dL    Studies/Results: Dg Chest Portable 1 View  01/05/2014   CLINICAL DATA:  Chest pain  EXAM: PORTABLE CHEST - 1 VIEW  COMPARISON:  12/31/2013  FINDINGS: Cardiac enlargement with mild heart failure. Pulmonary vascular congestion without edema or effusion.  Mild  bibasilar atelectasis.  Cardiac loop recorder noted.  Left coronary stent noted.  IMPRESSION: Mild fluid overload without edema.   Electronically Signed   By: Franchot Gallo M.D.   On: 01/05/2014 10:09    Medications: {Reviewed   @PROBHOSP @  1.  CP  Asymptomatic  Dilt was increased to 360  Her heart rate will need to be followed over time to see average  Too early to tell.  Some mention yesterday byM Skains and earlier by Cherlynn Kaiser that she should not be chronically paced ?because of leadless pacer.  Will need close f/u  AMbulating now.   2.  CAD  Follow  3.  Afib  Continue coumadin  4.  HL  Good control  Continue statin  6.  HTN  Good control overall    6  Hypokalemia  Improved  On low dose aldactone  Will need f/u.  Possible d/c today.    LOS: 1 day   Dorris Carnes 01/06/2014, 8:20 AM

## 2014-01-06 NOTE — Progress Notes (Signed)
ANTICOAGULATION CONSULT NOTE - Follow Up Consult  Pharmacy Consult for coumadin Indication: atrial fibrillation  Allergies  Allergen Reactions  . Bee Venom Anaphylaxis  . Amoxicillin Diarrhea and Nausea And Vomiting  . Other     Pain medications cause severe vomiting.    Patient Measurements: Height: 5' (152.4 cm) Weight: 169 lb 8 oz (76.885 kg) IBW/kg (Calculated) : 45.5   Vital Signs: Temp: 98 F (36.7 C) (12/04 0632) Temp Source: Oral (12/04 0632) BP: 140/58 mmHg (12/04 0958) Pulse Rate: 62 (12/04 0958)  Labs:  Recent Labs  01/05/14 1032 01/05/14 1246 01/05/14 1426 01/05/14 1912 01/06/14 0021  HGB 14.3  --   --   --  12.7  HCT 42.0  --   --   --  38.9  PLT  --   --   --   --  192  LABPROT  --   --  22.3*  --  24.7*  INR  --   --  1.94*  --  2.21*  CREATININE 0.90  --   --   --  1.18*  TROPONINI  --  <0.30  --  0.32* <0.30    Estimated Creatinine Clearance: 34.3 mL/min (by C-G formula based on Cr of 1.18).   Assessment: Patient is an 80 y.o F on coumadin for hx Afib.  INR is therapeutic today at 2.21.  No bleeding documented.  Goal of Therapy:  INR 2-3    Plan:  1. Coumadin 1mg  PO x 1 today 2. Daily INR   Siddh Vandeventer P 01/06/2014,10:16 AM

## 2014-01-06 NOTE — Plan of Care (Signed)
Problem: Consults Goal: Atrial Arhythmia Patient Education (See Patient Education module for education specifics.)  Outcome: Completed/Met Date Met:  01/06/14 Goal: Skin Care Protocol Initiated - if Braden Score 18 or less If consults are not indicated, leave blank or document N/A  Outcome: Not Applicable Date Met:  51/10/21 Goal: Tobacco Cessation referral if indicated Outcome: Not Applicable Date Met:  11/73/56 Goal: Nutrition Consult-if indicated Outcome: Not Applicable Date Met:  70/14/10 Goal: Diabetes Guidelines if Diabetic/Glucose > 140 If diabetic or lab glucose is > 140 mg/dl - Initiate Diabetes/Hyperglycemia Guidelines & Document Interventions  Outcome: Not Applicable Date Met:  30/13/14  Problem: Phase I Progression Outcomes Goal: Ventricular heart rate < 120/min Outcome: Completed/Met Date Met:  01/06/14 Goal: Anticoagulation Therapy per MD order Outcome: Completed/Met Date Met:  01/06/14 Goal: Heart rate or rhythm control medication Outcome: Completed/Met Date Met:  01/06/14 Goal: If Ablation, see post EP orders Outcome: Not Applicable Date Met:  38/88/75 Goal: Pain controlled with appropriate interventions Outcome: Completed/Met Date Met:  01/06/14 Goal: Initial discharge plan identified Outcome: Completed/Met Date Met:  01/06/14 Goal: Hemodynamically stable Outcome: Completed/Met Date Met:  01/06/14 Goal: Other Phase I Outcomes/Goals Outcome: Completed/Met Date Met:  01/06/14

## 2014-01-09 ENCOUNTER — Telehealth: Payer: Self-pay | Admitting: Interventional Cardiology

## 2014-01-09 NOTE — Telephone Encounter (Signed)
New Message   Pt called states that she was released on friday and she has questions about her medication list// She reports the medications prescribed are In direct conflict with the fluoresimide medication. Another Dirretic was prescribed and she says it doesn't deplete her potassium. So she wants to make sure that her medications are not in conflict with one another. Please call back to discuss

## 2014-01-09 NOTE — Telephone Encounter (Signed)
I spoke with the patient. She states that she was hospitalized 12/3-12/4 for chest pain. She was already taking furosemide 40 mg two tablets in the AM daily and one tablet in the PM as needed. She confirms that she typically takes a total of lasix 80 mg every day with potassium 20 meq three tablets daily. She only takes her additional lasix at night when she has swelling to the ankles or 1-1 & 1/2 lb weight gain 24 hours. She typically only has to take the evening dose about once weekly. She was placed on spironolactone 12.5 mg daily at discharge. She is uncertain why. Per the discharge note, there is no clear reason why. It does state that he potassium was low on admission (3.2) and up to 4.5 on 12/4 after the addition of spironolactone and additional K+ in the hospital. Discussed with Dr. Irish Lack. Ok not to start spironolactone 12.5 mg now. The patient will have a post hospital follow up on Friday 12.11 with Truitt Merle, NP. Will leave on medication list for now until potassium repeated on Friday. She has chronically low K+ (12/14/13- 3.5).

## 2014-01-10 ENCOUNTER — Encounter (HOSPITAL_COMMUNITY): Payer: Self-pay | Admitting: *Deleted

## 2014-01-11 DIAGNOSIS — M25551 Pain in right hip: Secondary | ICD-10-CM | POA: Diagnosis not present

## 2014-01-12 ENCOUNTER — Encounter (HOSPITAL_COMMUNITY): Payer: Self-pay | Admitting: Interventional Cardiology

## 2014-01-13 ENCOUNTER — Encounter: Payer: Self-pay | Admitting: Nurse Practitioner

## 2014-01-13 ENCOUNTER — Ambulatory Visit (INDEPENDENT_AMBULATORY_CARE_PROVIDER_SITE_OTHER): Payer: Medicare Other | Admitting: Nurse Practitioner

## 2014-01-13 ENCOUNTER — Ambulatory Visit (INDEPENDENT_AMBULATORY_CARE_PROVIDER_SITE_OTHER): Payer: Medicare Other | Admitting: Pharmacist

## 2014-01-13 VITALS — BP 118/60 | HR 98 | Ht 60.0 in | Wt 171.8 lb

## 2014-01-13 DIAGNOSIS — E785 Hyperlipidemia, unspecified: Secondary | ICD-10-CM | POA: Diagnosis not present

## 2014-01-13 DIAGNOSIS — I2510 Atherosclerotic heart disease of native coronary artery without angina pectoris: Secondary | ICD-10-CM

## 2014-01-13 DIAGNOSIS — Z9889 Other specified postprocedural states: Secondary | ICD-10-CM

## 2014-01-13 DIAGNOSIS — M25551 Pain in right hip: Secondary | ICD-10-CM | POA: Diagnosis not present

## 2014-01-13 DIAGNOSIS — I4891 Unspecified atrial fibrillation: Secondary | ICD-10-CM | POA: Diagnosis not present

## 2014-01-13 DIAGNOSIS — I200 Unstable angina: Secondary | ICD-10-CM

## 2014-01-13 DIAGNOSIS — I10 Essential (primary) hypertension: Secondary | ICD-10-CM

## 2014-01-13 DIAGNOSIS — E039 Hypothyroidism, unspecified: Secondary | ICD-10-CM

## 2014-01-13 LAB — BASIC METABOLIC PANEL
BUN: 23 mg/dL (ref 6–23)
CO2: 30 mEq/L (ref 19–32)
Calcium: 8.9 mg/dL (ref 8.4–10.5)
Chloride: 103 mEq/L (ref 96–112)
Creatinine, Ser: 1 mg/dL (ref 0.4–1.2)
GFR: 57.82 mL/min — ABNORMAL LOW (ref >60.00)
Glucose, Bld: 109 mg/dL — ABNORMAL HIGH (ref 70–99)
Potassium: 3.5 mEq/L (ref 3.5–5.1)
Sodium: 138 mEq/L (ref 135–145)

## 2014-01-13 LAB — POCT INR: INR: 2.4

## 2014-01-13 LAB — TSH: TSH: 4.65 u[IU]/mL — ABNORMAL HIGH (ref 0.35–4.50)

## 2014-01-13 NOTE — Patient Instructions (Signed)
We will be checking the following labs today TSH and BMET  Stay on your current medicines  Ok to start the aldactone  Lab in a week  I will see you in 6 weeks  Call the [place] office at [phone] if you have any questions, problems or concerns.

## 2014-01-13 NOTE — Progress Notes (Signed)
Patty [name] Date of Birth: [birth year] Medical Record #[id number]  History of Present Illness: Patty [name] is seen back today for a post hospital visit. Seen for Dr. Irish Lack. She is a former patient of Dr. Susa Simmonds that I used to see. She is an 80 y.o. female with a history of CAD s/p multiple stents, LBBB, chronic combined systolic/diastolic HF, PVD, HTN, HLD, tachy-brady s/p PPM (leadless), obesity, and OSA on CPAP. She has a granulosa carcinoma that has flared in the past. She has chronic atrial fib with a CHADSVASC of at least 5 - she is on anticoagulation.   She was last seen here in November - felt to be doing ok. This was after a prior admission for chest pain and was cathed - patent stents. Found to have rapid heart rate and metoprolol was increased significantly. ACE cut back.  The most recent cardiac catheterization on 12/14/13 demonstrating the following: 1. Significant coronary artery disease with total occlusion of the first obtuse marginal, 70-80% second obtuse marginal stenosis, segmental diffuse 50-70% mid circumflex, 50-70% proximal LAD (recent FFR 0.83), diffusely diseased but non-critical RCA, with widely patent stents in the proximal LAD and circumflex. 2. No change in the angiographic appearance of the coronaries since the September procedure. 3. Left ventricular systolic dysfunction, with EF 40-50%  Presented back to [facility] on 01/05/14 with chest pain. Negative enzymes and due to recent cath data was felt to best be managed medically. Aldactone was also added to her regimen due to hypokalemia.   Comes back today. Here alone. Doing well. I have not seen her in about 3 years. Noted to be on both Plavix and Coumadin. Last PCI was in September 2015. She notes that she is doing ok. Some fatigue. No chest pain. No swelling. Feels her heart beating a little fast at times but overall has had a good week since being home. She is now seeing a Dr.  Michail Sermon for primary care. Christinia Lambeth does not feel like she has had thyroid checked - last one I see is quite elevated. She has not started the Aldactone - she did not understand why.   Current Outpatient Prescriptions  Medication Sig Dispense Refill  . acetaminophen (TYLENOL) 500 MG tablet Take 500 mg by mouth every 8 (eight) hours as needed.     Marland Kitchen atorvastatin (LIPITOR) 80 MG tablet TAKE 1 TABLET (80 MG TOTAL) BY MOUTH EVERY MORNING. 30 tablet 5  . Calcium Carb-Cholecalciferol (CALTRATE 600+D) 600-800 MG-UNIT TABS Take 1 tablet by mouth every morning.     . clopidogrel (PLAVIX) 75 MG tablet TAKE 1 TABLET BY MOUTH EVERY DAY WITH BREAKFAST 30 tablet 3  . Coenzyme Q-10 100 MG capsule Take 100 mg by mouth daily.     Marland Kitchen diltiazem (CARDIZEM CD) 360 MG 24 hr capsule Take 1 capsule (360 mg total) by mouth daily. 30 capsule 11  . furosemide (LASIX) 40 MG tablet TAKE 2 TABLETS BY MOUTH EVERY MORNING AND TAKE 1 TABLET EVERY EVENING AS NEEDED 90 tablet 1  . isosorbide mononitrate (IMDUR) 60 MG 24 hr tablet Take 0.5-1 tablets (30-60 mg total) by mouth 2 (two) times daily. 60mg  every morning and 30mg  every evening 45 tablet 1  . lisinopril (PRINIVIL,ZESTRIL) 5 MG tablet Take 5 mg by mouth 2 (two) times daily.    . metoprolol tartrate (LOPRESSOR) 100 MG tablet Take 1 tablet (100 mg total) by mouth 2 (two) times daily. 60 tablet 11  . Multiple Vitamin (MULTIVITAMIN WITH MINERALS) TABS  tablet Take 1 tablet by mouth every morning.    . Multiple Vitamins-Minerals (OCUVITE PRESERVISION) TABS Take 1 tablet by mouth daily.     . nitroGLYCERIN (NITROSTAT) 0.4 MG SL tablet Place 0.4 mg under the tongue every 5 (five) minutes as needed for chest pain.    . Omega-3 Fatty Acids (FISH OIL PO) Take 1,200 mg by mouth daily.     . potassium chloride SA (K-DUR,KLOR-CON) 20 MEQ tablet Take 3 tablets (60 mEq total) by mouth daily. 90 tablet 5  . warfarin (COUMADIN) 2 MG tablet Take as directed by Coumadin clinic (Patient taking  differently: Take 1-2 mg by mouth daily. Take 2mg  daily except 1mg  on Tuesday) 30 tablet 3  . spironolactone (ALDACTONE) 12.5 mg TABS tablet Take 0.5 tablets (12.5 mg total) by mouth daily. (Patient not taking: Reported on 01/13/2014) 30 tablet 11   No current facility-administered medications for this visit.    Allergies  Allergen Reactions  . Bee Venom Anaphylaxis  . Amoxicillin Diarrhea and Nausea And Vomiting  . Other     Pain medications cause severe vomiting.    Past Medical History  Diagnosis Date  . Hypertension   . Hyperlipidemia   . Obesity   . Diverticulitis   . Peripheral vascular disease   . Varicose veins   . Granulosa cell carcinoma     abd; last episode was in 2009  . Tachycardia-bradycardia syndrome     a. s/p leadless pacemaker ([name]) implanted by Dr [name]  . Atrial fibrillation 2013  . OSA on CPAP   . Combined systolic and diastolic heart failure, NYHA class 3   . Coronary artery disease     a. s/p multiple stents  b. LHC 12/14/13 with sig CAD with TO of OM1, 70-80% occusion OM2, 50-70% mLCx, 50-70% pLAD (recent FFR 0.83), diffusely diseased RCA, widely patent stetns in pLAD and LCx.  Marland Kitchen LBBB (left bundle branch block)     chronic  . Pacemaker   . Myocardial infarction 2002    Past Surgical History  Procedure Laterality Date  . Cholecystectomy  1980's  . Salivary gland surgery  2000's    "had a little lump removed; granulosa related; it was benign" (08/11/2012)  . US echocardiography  11/21/2003    EF 55-60%  . Cardiovascular stress test  03/23/2009    EF 72%  . Abdominal hysterectomy    . Colon surgery  2004    colectomy for diverticulosis  . Granulosa tumor excision  2000; 2003; 2004; 2007    "all in my abdomen including small intestines, outside my ?uterus/etc" (08/11/2012)  . Diagnostic laparoscopy      gallbladder removal; abdominal hernia repair  . Tee without cardioversion  12/31/2011    Procedure: TRANSESOPHAGEAL ECHOCARDIOGRAM (TEE);   Surgeon: [name], MD;  Location: [place];  Service: Cardiovascular;  Laterality: N/A;  . Cardioversion  12/31/2011    Procedure: CARDIOVERSION;  Surgeon: [name], MD;  Location: [place];  Service: Cardiovascular;  Laterality: N/A;  . Pacemaker insertion  2/11/4    [name] (SJM) leadless pacemaker (LEADLESS II STUDY PATEINT)  . Cardiac catheterization  09/03/2007    EF 70%; Failed attempt at PCI to OM  . Cardiac catheterization  11/01/2003    EF 70%  . Coronary angioplasty    . Coronary angioplasty with stent placement  2000; 08/11/2012; 11/12/2012    3 + 2 LAD & CFX; 2nd CFX stent 11/12/2012  . Insert / replace / remove pacemaker  03/16/2012    [name] (SJM) leadless pacemaker (LEADLESS II STUDY PATEINT)  . Varicose vein surgery Bilateral 1977  . Hernia repair  2005    "laparoscopic"  . Cataract extraction, bilateral  2015  . Cardioversion N/A 12/31/2011    Procedure: CARDIOVERSION;  Surgeon: [name], MD;  Location[facility] CATH LAB;  Service: Cardiovascular;  Laterality: N/A;  . Permanent pacemaker insertion N/A 03/16/2012    Procedure: PERMANENT PACEMAKER INSERTION;  Surgeon: Thompson Grayer, MD;  Location: [facility] CATH LAB;  Service: Cardiovascular;  Laterality: N/A;  . Percutaneous coronary intervention-balloon only  08/04/2012    Procedure: PERCUTANEOUS CORONARY INTERVENTION-BALLOON ONLY;  Surgeon: [name], MD;  Location: [facility] CATH LAB;  Service: Cardiovascular;;  . Percutaneous coronary rotoblator intervention (pci-r) N/A 08/11/2012    Procedure: PERCUTANEOUS CORONARY ROTOBLATOR INTERVENTION (PCI-R);  Surgeon: [name], MD;  Location: [facility] CATH LAB;  Service: Cardiovascular;  Laterality: N/A;  . Left heart catheterization with coronary angiogram N/A 11/12/2012    Procedure: LEFT HEART CATHETERIZATION WITH CORONARY ANGIOGRAM;  Surgeon: [name], MD;  Location: [facility] CATH LAB;  Service: Cardiovascular;  Laterality: N/A;  . Left  heart catheterization with coronary angiogram N/A 10/07/2013    Procedure: LEFT HEART CATHETERIZATION WITH CORONARY ANGIOGRAM;  Surgeon: [name], MD;  Location: [facility] CATH LAB;  Service: Cardiovascular;  Laterality: N/A;  . Percutaneous coronary stent intervention (pci-s)  10/07/2013    Procedure: PERCUTANEOUS CORONARY STENT INTERVENTION (PCI-S);  Surgeon: [name], MD;  Location: [facility] CATH LAB;  Service: Cardiovascular;;  . Fractional flow reserve wire  10/07/2013    Procedure: FRACTIONAL FLOW RESERVE WIRE;  Surgeon: [name], MD;  Location: [facility] CATH LAB;  Service: Cardiovascular;;  . Left heart catheterization with coronary angiogram N/A 12/14/2013    Procedure: LEFT HEART CATHETERIZATION WITH CORONARY ANGIOGRAM;  Surgeon: Sinclair Grooms, MD;  Location: [facility] CATH LAB;  Service: Cardiovascular;  Laterality: N/A;    History  Smoking status  . Former Smoker -- 1.00 packs/day for 32 years  . Types: Cigarettes  . Quit date: 02/03/1974  Smokeless tobacco  . Never Used    History  Alcohol Use  . 3.0 oz/week  . 5 Glasses of wine per week    Comment: 11/12/2012 "4oz wine w/dinner 5 days/wk"    Family History  Problem Relation Age of Onset  . Heart attack Father   . Heart attack Brother   . Stroke Mother   . Diabetes Brother   . Diabetes Father   . Hypertension Brother   . Hypertension Father   . Kidney failure Brother     Review of Systems: The review of systems is per the HPI.  All other systems were reviewed and are negative.  Physical Exam: BP 118/60 mmHg  Pulse 98  Ht 5' (1.524 m)  Wt 171 lb 12.8 oz (77.928 kg)  BMI 33.55 kg/m2  SpO2 97% Patient is very pleasant and in no acute distress. Skin is warm and dry. Color is normal.  HEENT is unremarkable. Normocephalic/atraumatic. PERRL. Sclera are nonicteric. Neck is supple. No masses. No JVD. Lungs are clear. Cardiac exam shows an irregular rhythm. Rate is fair. Abdomen is soft. Extremities are without  edema. Gait and ROM are intact. No gross neurologic deficits noted.  Wt Readings from Last 3 Encounters:  01/13/14 171 lb 12.8 oz (77.928 kg)  01/06/14 169 lb 8 oz (76.885 kg)  12/28/13 171 lb (77.565 kg)    LABORATORY DATA/PROCEDURES:  Lab  Results  Component Value Date   WBC 5.9 01/06/2014   HGB 12.7 01/06/2014   HCT 38.9 01/06/2014   PLT 192 01/06/2014   GLUCOSE 108* 01/06/2014   CHOL 125 01/06/2014   TRIG 70 01/06/2014   HDL 64 01/06/2014   LDLCALC 47 01/06/2014   ALT 25 12/12/2013   AST 30 12/12/2013   NA 142 01/06/2014   K 4.5 01/06/2014   CL 103 01/06/2014   CREATININE 1.18* 01/06/2014   BUN 30* 01/06/2014   CO2 28 01/06/2014   TSH 12.163* 03/10/2012   INR 2.21* 01/06/2014   HGBA1C 5.8* 12/11/2013   Lab Results  Component Value Date   INR 2.21* 01/06/2014   INR 1.94* 01/05/2014   INR 3.4 01/02/2014    BNP (last 3 results)  Recent Labs  10/04/13 1221 12/11/13 1101 01/05/14 1027  PROBNP 1963.0* 1383.0* 1341.0*     Assessment / Plan: 1. CAD - recent cath - has had history of multiple caths/PCIs - most recent study showing medical management - she is better clinically. Remains on Plavix.   2. Combined systolic/diastolic HF - EF 40 to 24% by recent cath - have asked her to start the aldactone.   3. Hypokalemia - to start aldactone. Check BMET today. Repeat in one week - hope to decrease her amount of Kdur.   4. Tachybrady - with leadless PPM in place  5. Chronic AF - managed with leadless PPM, chronic anticoagulation  6. Elevated TSH - will recheck today. This may be complicating matters.  She is given the ok to do some PT thru [facility].  Lab today. INR today. See in 6 weeks.   Patient is agreeable to this plan and will call if any problems develop in the interim.   Burtis Junes, RN, [place] [address] [place], [place]  [phone]

## 2014-01-16 ENCOUNTER — Telehealth: Payer: Self-pay | Admitting: Interventional Cardiology

## 2014-01-16 DIAGNOSIS — M25551 Pain in right hip: Secondary | ICD-10-CM | POA: Diagnosis not present

## 2014-01-16 NOTE — Telephone Encounter (Signed)
Will forward to Dr. Irish Lack for ok to do a letter.

## 2014-01-16 NOTE — Telephone Encounter (Signed)
New Msg    Pt needs a letter stating that it is ok for her to continue phys therapy because she was hospitalized for her heart.  Please contact pt at [phone] which is cell phone or just call Brooke at [facility] [phone].

## 2014-01-18 NOTE — Telephone Encounter (Signed)
I left a message on Brooke's voice mail at [facility] that the patient may resume therapy and to call should she need this in writing. I have left a message for the patient to call to inform her of this.

## 2014-01-18 NOTE — Telephone Encounter (Signed)
OK for her to do therapy.

## 2014-01-20 ENCOUNTER — Other Ambulatory Visit (INDEPENDENT_AMBULATORY_CARE_PROVIDER_SITE_OTHER): Payer: Medicare Other | Admitting: *Deleted

## 2014-01-20 DIAGNOSIS — Z9889 Other specified postprocedural states: Secondary | ICD-10-CM | POA: Diagnosis not present

## 2014-01-20 DIAGNOSIS — I2510 Atherosclerotic heart disease of native coronary artery without angina pectoris: Secondary | ICD-10-CM | POA: Diagnosis not present

## 2014-01-20 DIAGNOSIS — E039 Hypothyroidism, unspecified: Secondary | ICD-10-CM

## 2014-01-20 DIAGNOSIS — E785 Hyperlipidemia, unspecified: Secondary | ICD-10-CM | POA: Diagnosis not present

## 2014-01-20 DIAGNOSIS — I10 Essential (primary) hypertension: Secondary | ICD-10-CM

## 2014-01-20 DIAGNOSIS — M25551 Pain in right hip: Secondary | ICD-10-CM | POA: Diagnosis not present

## 2014-01-20 LAB — BASIC METABOLIC PANEL
BUN: 28 mg/dL — ABNORMAL HIGH (ref 6–23)
CO2: 29 mEq/L (ref 19–32)
Calcium: 8.9 mg/dL (ref 8.4–10.5)
Chloride: 102 mEq/L (ref 96–112)
Creatinine, Ser: 1.2 mg/dL (ref 0.4–1.2)
GFR: 45.33 mL/min — ABNORMAL LOW (ref >60.00)
Glucose, Bld: 97 mg/dL (ref 70–99)
Potassium: 4.4 mEq/L (ref 3.5–5.1)
Sodium: 139 mEq/L (ref 135–145)

## 2014-01-23 ENCOUNTER — Other Ambulatory Visit: Payer: Self-pay | Admitting: *Deleted

## 2014-01-23 DIAGNOSIS — E875 Hyperkalemia: Secondary | ICD-10-CM

## 2014-01-23 MED ORDER — POTASSIUM CHLORIDE CRYS ER 20 MEQ PO TBCR: 40.0000 meq | EXTENDED_RELEASE_TABLET | Freq: Every day | ORAL | Status: DC

## 2014-01-30 ENCOUNTER — Other Ambulatory Visit: Payer: Self-pay | Admitting: Interventional Cardiology

## 2014-01-30 DIAGNOSIS — M25551 Pain in right hip: Secondary | ICD-10-CM | POA: Diagnosis not present

## 2014-01-31 ENCOUNTER — Ambulatory Visit (INDEPENDENT_AMBULATORY_CARE_PROVIDER_SITE_OTHER): Payer: Medicare Other | Admitting: *Deleted

## 2014-01-31 ENCOUNTER — Other Ambulatory Visit (INDEPENDENT_AMBULATORY_CARE_PROVIDER_SITE_OTHER): Payer: Medicare Other | Admitting: *Deleted

## 2014-01-31 ENCOUNTER — Other Ambulatory Visit: Payer: Self-pay | Admitting: Interventional Cardiology

## 2014-01-31 ENCOUNTER — Other Ambulatory Visit: Payer: Self-pay | Admitting: *Deleted

## 2014-01-31 DIAGNOSIS — E875 Hyperkalemia: Secondary | ICD-10-CM

## 2014-01-31 DIAGNOSIS — I4891 Unspecified atrial fibrillation: Secondary | ICD-10-CM | POA: Diagnosis not present

## 2014-01-31 LAB — BASIC METABOLIC PANEL
BUN: 33 mg/dL — ABNORMAL HIGH (ref 6–23)
CO2: 31 mEq/L (ref 19–32)
Calcium: 8.9 mg/dL (ref 8.4–10.5)
Chloride: 103 mEq/L (ref 96–112)
Creatinine, Ser: 1.1 mg/dL (ref 0.4–1.2)
GFR: 49.05 mL/min — ABNORMAL LOW (ref >60.00)
Glucose, Bld: 92 mg/dL (ref 70–99)
Potassium: 4.4 mEq/L (ref 3.5–5.1)
Sodium: 141 mEq/L (ref 135–145)

## 2014-01-31 LAB — POCT INR: INR: 2.4

## 2014-01-31 MED ORDER — POTASSIUM CHLORIDE CRYS ER 20 MEQ PO TBCR: 20.0000 meq | EXTENDED_RELEASE_TABLET | Freq: Every day | ORAL | Status: DC

## 2014-02-01 ENCOUNTER — Other Ambulatory Visit: Payer: Medicare Other

## 2014-02-01 ENCOUNTER — Other Ambulatory Visit: Payer: Self-pay | Admitting: Interventional Cardiology

## 2014-02-02 ENCOUNTER — Other Ambulatory Visit: Payer: Self-pay | Admitting: Interventional Cardiology

## 2014-02-10 ENCOUNTER — Telehealth: Payer: Self-pay | Admitting: Interventional Cardiology

## 2014-02-10 NOTE — Telephone Encounter (Signed)
New Msg        Please contact Patty [name] at cardiac rehab about a referral for pt.   Pt wants to attend orientation soon.   [name] may be reached at [phone].

## 2014-02-13 NOTE — Telephone Encounter (Signed)
Staff message to [name] to clarify phase I or II form that needs to be completed. Patient's last hospitalized in December with chest pain and cathed at that time, but no new intervention done at that time. Need clarification on how to complete appropriate form.

## 2014-02-15 ENCOUNTER — Other Ambulatory Visit: Payer: Medicare Other

## 2014-02-15 ENCOUNTER — Telehealth: Payer: Self-pay | Admitting: Interventional Cardiology

## 2014-02-15 NOTE — Telephone Encounter (Signed)
Pt called because [name] in Cardiac rehab has not received to form back for pt to start orientation. Maria at [facility]  rehab faxed form to this office today, she would like for Dr. Irish Lack or Dr. [name] to sign and fax form back to her. Janan Halter received the form today, she  STATE WILL ASK DR. Allred  to [place] form tomorrow when he is in the office SHE WILL FAX IT BACK TO MARIA  The same day. PT IS AWARE.

## 2014-02-15 NOTE — Telephone Encounter (Signed)
New Message      Patient needs a letter saying that she is referred to Cardiac Rehab @ [facility].   Please give patient a call back

## 2014-02-16 NOTE — Telephone Encounter (Signed)
Follow up     Calling to check status on cardiac rehab order.  Pt was to start next week but they have not received it from [place].

## 2014-02-17 ENCOUNTER — Other Ambulatory Visit (INDEPENDENT_AMBULATORY_CARE_PROVIDER_SITE_OTHER): Payer: Medicare Other | Admitting: *Deleted

## 2014-02-17 DIAGNOSIS — E875 Hyperkalemia: Secondary | ICD-10-CM | POA: Diagnosis not present

## 2014-02-17 LAB — BASIC METABOLIC PANEL
BUN: 37 mg/dL — ABNORMAL HIGH (ref 6–23)
CO2: 33 mEq/L — ABNORMAL HIGH (ref 19–32)
Calcium: 9.1 mg/dL (ref 8.4–10.5)
Chloride: 102 mEq/L (ref 96–112)
Creatinine, Ser: 1.09 mg/dL (ref 0.40–1.20)
GFR: 51.13 mL/min — ABNORMAL LOW (ref >60.00)
Glucose, Bld: 106 mg/dL — ABNORMAL HIGH (ref 70–99)
Potassium: 4.1 mEq/L (ref 3.5–5.1)
Sodium: 139 mEq/L (ref 135–145)

## 2014-02-17 NOTE — Telephone Encounter (Signed)
Form faxed and received by Cardiac Rehab.

## 2014-02-17 NOTE — Telephone Encounter (Signed)
F/U            [name] from Cardiac Rehab calling to check on orders from Dr. Irish Lack. Please call  [name] [phone].

## 2014-02-17 NOTE — Telephone Encounter (Signed)
Done

## 2014-02-20 ENCOUNTER — Telehealth: Payer: Self-pay

## 2014-02-20 ENCOUNTER — Ambulatory Visit: Payer: Medicare Other | Admitting: Nurse Practitioner

## 2014-02-20 NOTE — Telephone Encounter (Signed)
Pt aware of lab results with verbal understanding. Ok to report. Still with some mild kidney impairment - probably related to the aldactone - continue for now but we will need to recheck a BMET when I see her back in early February.

## 2014-02-20 NOTE — Telephone Encounter (Signed)
-----   Message from Burtis Junes, NP sent at 02/20/2014  7:55 AM EST ----- Ok to report. Still with some mild kidney impairment - probably related to the aldactone - continue for now but we will need to recheck a BMET when I see her back in early February.

## 2014-02-21 ENCOUNTER — Telehealth: Payer: Self-pay | Admitting: Interventional Cardiology

## 2014-02-21 ENCOUNTER — Telehealth: Payer: Self-pay | Admitting: *Deleted

## 2014-02-21 NOTE — Telephone Encounter (Signed)
New message    Patient calling would like a call today in reference to her nitroglycerin - have plenty of refill.    Starting cardiac rehab on next Monday.  Took nitro on Friday or Saturday.

## 2014-02-21 NOTE — Telephone Encounter (Signed)
Patient has increased the frequency of taking her Nitroglycerin. She has taken one pill for three days out of the last six days. Patient states " I will have a little pressure, in the morning and I take a Nitroglycerin and it goes away. I feel fine right now. The nurse from cardiac rehab wanted to let someone from Dr. Hassell Done office know." Informed patient that a message would be sent to Dr. Irish Lack and Truitt Merle NP, who she has seen recently and has an appointment with in February. Patient verbalized understanding.

## 2014-02-21 NOTE — Telephone Encounter (Signed)
Dr. Irish Lack has an open appointment tomorrow - will ask her to come.

## 2014-02-21 NOTE — Telephone Encounter (Signed)
S/w pt is aware made appointment for Dr. Irish Lack tomorrow.  Pt agreeable to plan will come in on January 20 @ 3:30

## 2014-02-22 ENCOUNTER — Ambulatory Visit: Payer: Medicare Other | Admitting: Interventional Cardiology

## 2014-02-22 ENCOUNTER — Ambulatory Visit (INDEPENDENT_AMBULATORY_CARE_PROVIDER_SITE_OTHER): Payer: Medicare Other | Admitting: Interventional Cardiology

## 2014-02-22 ENCOUNTER — Ambulatory Visit (INDEPENDENT_AMBULATORY_CARE_PROVIDER_SITE_OTHER): Payer: Medicare Other | Admitting: *Deleted

## 2014-02-22 ENCOUNTER — Encounter: Payer: Self-pay | Admitting: Interventional Cardiology

## 2014-02-22 VITALS — BP 120/78 | HR 75 | Ht 60.0 in | Wt 171.0 lb

## 2014-02-22 DIAGNOSIS — I4891 Unspecified atrial fibrillation: Secondary | ICD-10-CM | POA: Diagnosis not present

## 2014-02-22 DIAGNOSIS — I10 Essential (primary) hypertension: Secondary | ICD-10-CM | POA: Diagnosis not present

## 2014-02-22 DIAGNOSIS — I25119 Atherosclerotic heart disease of native coronary artery with unspecified angina pectoris: Secondary | ICD-10-CM | POA: Diagnosis not present

## 2014-02-22 DIAGNOSIS — I4892 Unspecified atrial flutter: Secondary | ICD-10-CM | POA: Diagnosis not present

## 2014-02-22 LAB — POCT INR: INR: 2.5

## 2014-02-22 NOTE — Progress Notes (Signed)
Patient ID: Patty [name], female   DOB: 07-22-1930, 81 y.o.   MRN: [id number]    [place], [place], [place]  [address] Phone: [phone] Fax:  [phone]  Date:  02/22/2014   ID:  Patty [name], DOB 07/04/1930, MRN [id number]  PCP:  Dorian Heckle, MD      History of Present Illness: Patty [name] is a 81 y.o. female with AFib and CAD. She had multivessel rotational atherectomy and stent placement in 7/14. She finished rehab without any problems. In 9/15, She had a DES to an area of ISR from an old Bare metal stent from 2005 in the LAD.  She has had more chest discomfort at times. Her medicines were adjusted for better rate control.  Overall she has been better.  She had a repeat cath in 11/15 and her LAD was unchanged.  She was going to start cardiac rehab but because she reported some chest discomfort, they wanted her evaluated first.    She has some mild CP in the mornings.  She has no problems with exercise.  She goes to [employer] several times a week and has no difficulty.     Wt Readings from Last 3 Encounters:  02/22/14 171 lb (77.565 kg)  01/13/14 171 lb 12.8 oz (77.928 kg)  01/06/14 169 lb 8 oz (76.885 kg)     Past Medical History  Diagnosis Date  . Hypertension   . Hyperlipidemia   . Obesity   . Diverticulitis   . Peripheral vascular disease   . Varicose veins   . Granulosa cell carcinoma     abd; last episode was in 2009  . Tachycardia-bradycardia syndrome     a. s/p leadless pacemaker ([name]) implanted by Dr [name]  . Atrial fibrillation 2013  . OSA on CPAP   . Combined systolic and diastolic heart failure, NYHA class 3   . Coronary artery disease     a. s/p multiple stents  b. LHC 12/14/13 with sig CAD with TO of OM1, 70-80% occusion OM2, 50-70% mLCx, 50-70% pLAD (recent FFR 0.83), diffusely diseased RCA, widely patent stetns in pLAD and LCx.  Marland Kitchen LBBB (left bundle branch block)     chronic  . Pacemaker   . Myocardial  infarction 2002    Current Outpatient Prescriptions  Medication Sig Dispense Refill  . acetaminophen (TYLENOL) 500 MG tablet Take 500 mg by mouth every 8 (eight) hours as needed.     Marland Kitchen atorvastatin (LIPITOR) 80 MG tablet TAKE 1 TABLET (80 MG TOTAL) BY MOUTH EVERY MORNING. 30 tablet 5  . Calcium Carb-Cholecalciferol (CALTRATE 600+D) 600-800 MG-UNIT TABS Take 1 tablet by mouth every morning.     . clopidogrel (PLAVIX) 75 MG tablet TAKE 1 TABLET BY MOUTH EVERY DAY WITH BREAKFAST 30 tablet 3  . Coenzyme Q-10 100 MG capsule Take 100 mg by mouth daily.     Marland Kitchen diltiazem (CARDIZEM CD) 360 MG 24 hr capsule Take 1 capsule (360 mg total) by mouth daily. 30 capsule 11  . furosemide (LASIX) 40 MG tablet TAKE 2 TABLETS BY MOUTH EVERY MORNING AND TAKE 1 TABLET EVERY EVENING AS NEEDED (Patient taking differently: TAKE 2 TABLETS BY MOUTH EVERY MORNING) 90 tablet 1  . isosorbide mononitrate (IMDUR) 60 MG 24 hr tablet TAKE 1 TABLET BY MOUTH EVERY MORNING AND 1/2 TABLET EVERY EVENING 45 tablet 1  . KLOR-CON M20 20 MEQ tablet TAKE 3 TABLETS (60 MEQ TOTAL) BY MOUTH DAILY. (Patient taking  differently: Take 1 Tablet daily) 90 tablet 1  . lisinopril (PRINIVIL,ZESTRIL) 5 MG tablet Take 5 mg by mouth 2 (two) times daily.    . metoprolol tartrate (LOPRESSOR) 100 MG tablet Take 1 tablet (100 mg total) by mouth 2 (two) times daily. 60 tablet 11  . Multiple Vitamin (MULTIVITAMIN WITH MINERALS) TABS tablet Take 1 tablet by mouth every morning.    . Multiple Vitamins-Minerals (OCUVITE PRESERVISION) TABS Take 1 tablet by mouth daily.     Marland Kitchen NITROSTAT 0.4 MG SL tablet PLACE 1 TABLET (0.4 MG TOTAL) UNDER THE TONGUE EVERY 5 (FIVE) MINUTES AS NEEDED. FOR CHEST PAIN 25 tablet 3  . Omega-3 Fatty Acids (FISH OIL PO) Take 1,200 mg by mouth daily.     Marland Kitchen spironolactone (ALDACTONE) 12.5 mg TABS tablet Take 0.5 tablets (12.5 mg total) by mouth daily. 30 tablet 11  . warfarin (COUMADIN) 2 MG tablet Take as directed by Coumadin clinic (Patient  taking differently: Take 1-2 mg by mouth daily. Take 2mg  daily except 1mg  on Tuesday) 30 tablet 3   No current facility-administered medications for this visit.    Allergies:    Allergies  Allergen Reactions  . Bee Venom Anaphylaxis  . Amoxicillin Diarrhea and Nausea And Vomiting  . Other     Pain medications cause severe vomiting.    Social History:  The patient  reports that she quit smoking about 40 years ago. Her smoking use included Cigarettes. She has a 32 pack-year smoking history. She has never used smokeless tobacco. She reports that she drinks about 3.0 oz of alcohol per week. She reports that she does not use illicit drugs.   Family History:  The patient's family history includes Diabetes in her brother and father; Heart attack in her brother and father; Hypertension in her brother and father; Kidney failure in her brother; Stroke in her mother.   ROS:  Please see the history of present illness.  No nausea, vomiting.  No fevers, chills.  No focal weakness.  No dysuria.    All other systems reviewed and negative.   PHYSICAL EXAM: VS:  BP 120/78 mmHg  Pulse 75  Ht 5' (1.524 m)  Wt 171 lb (77.565 kg)  BMI 33.40 kg/m2 General: Well developed, well nourished, in no acute distress HEENT: normal Neck: no JVD, no carotid bruits Cardiac:  normal S1, S2; RRR; Lungs:  clear to auscultation bilaterally, no wheezing, rhonchi or rales Abd: soft, nontender, no hepatomegaly Ext: no edema Skin: warm and dry Neuro:   no focal abnormalities noted Psych: normal affect  EKG:  AFib, LBBB    ASSESSMENT AND PLAN:   CAD (coronary artery disease)  Continue Plavix Tablet, 75 MG, 1 tablet, Orally, Once a day Continue Atorvastatin Calcium Tablet, 80 MG, 1 tablet, Orally, Once a day Notes: No angina with additional rate control meds. Some of the discomfort that she has is atypical in the morning. It is not related to exertion. s/p DES to the LAD and circumflex. No aspirin due to plavix and  coumadin. OK for cardiac rehab. LDL 54 in 4/15. Continue statin.  FFR of the moderate lesion in her proximal LAD was negative. If she continues to have symptoms, we'll consider repeat cath with intervention of the LAD. With a long discussion regarding how seriously to take her symptoms. She is worried about being overcautious. I think, given her history, she is at risk for angina from small vessel disease. If she has prolonged episodes, she needs to seek attention.  2. Essential hypertension, benign  Notes: Controlled.    3. Atrial fibrillation  Continue Cardizem CD Capsule Extended Release 24 Hour, 240 MG, 1 capsule, Orally, Once a day - Continue current dose of metoprolol.  Notes: Rate controlled- but do not want her to be pacer dependent. Coumadin for stroke prevention. Coumadin for stroke prevention.  Chest pain symptoms improved with additional rate control medication.   4. Cardiomyopathy  Notes: EF 40% in 10/14. By cath, EF 55 % by cath in 9/15.     Preventive Medicine  Adult topics discussed:  Diet: healthy diet, low calorie, low fat.  Exercise: 5 days a week, at least 30 minutes of aerobic exercise.      Signed, Mina Marble, MD, [facility] 02/22/2014 4:31 PM

## 2014-02-22 NOTE — Telephone Encounter (Signed)
The patient has an appointment scheduled today to see Dr. Irish Lack at 3:30 PM.

## 2014-02-22 NOTE — Patient Instructions (Signed)
Your physician recommends that you continue on your current medications as directed. Please refer to the Current Medication list given to you today.  Your physician recommends that you keep your follow-up appointment with Truitt Merle NP on 03/08/14 at 3:00p.m.  You are ok to resume cardiac rehab.

## 2014-02-23 ENCOUNTER — Encounter (HOSPITAL_COMMUNITY)
Admission: RE | Admit: 2014-02-23 | Discharge: 2014-02-23 | Disposition: A | Discharge status: Home / Self Care | Payer: Medicare Other | Source: Ambulatory Visit | Attending: Interventional Cardiology | Admitting: Interventional Cardiology

## 2014-02-23 ENCOUNTER — Telehealth (HOSPITAL_COMMUNITY): Payer: Self-pay | Admitting: *Deleted

## 2014-02-23 DIAGNOSIS — Z950 Presence of cardiac pacemaker: Secondary | ICD-10-CM | POA: Insufficient documentation

## 2014-02-23 DIAGNOSIS — I739 Peripheral vascular disease, unspecified: Secondary | ICD-10-CM | POA: Insufficient documentation

## 2014-02-23 DIAGNOSIS — I252 Old myocardial infarction: Secondary | ICD-10-CM | POA: Insufficient documentation

## 2014-02-23 DIAGNOSIS — I200 Unstable angina: Secondary | ICD-10-CM | POA: Insufficient documentation

## 2014-02-23 DIAGNOSIS — I10 Essential (primary) hypertension: Secondary | ICD-10-CM | POA: Insufficient documentation

## 2014-02-23 DIAGNOSIS — Z7982 Long term (current) use of aspirin: Secondary | ICD-10-CM | POA: Insufficient documentation

## 2014-02-23 DIAGNOSIS — I4891 Unspecified atrial fibrillation: Secondary | ICD-10-CM | POA: Insufficient documentation

## 2014-02-23 DIAGNOSIS — Z7902 Long term (current) use of antithrombotics/antiplatelets: Secondary | ICD-10-CM | POA: Insufficient documentation

## 2014-02-23 DIAGNOSIS — Z7901 Long term (current) use of anticoagulants: Secondary | ICD-10-CM | POA: Insufficient documentation

## 2014-02-23 DIAGNOSIS — I259 Chronic ischemic heart disease, unspecified: Secondary | ICD-10-CM | POA: Insufficient documentation

## 2014-02-23 DIAGNOSIS — I2510 Atherosclerotic heart disease of native coronary artery without angina pectoris: Secondary | ICD-10-CM | POA: Insufficient documentation

## 2014-02-23 DIAGNOSIS — Z5189 Encounter for other specified aftercare: Secondary | ICD-10-CM | POA: Insufficient documentation

## 2014-02-23 DIAGNOSIS — G4733 Obstructive sleep apnea (adult) (pediatric): Secondary | ICD-10-CM | POA: Insufficient documentation

## 2014-02-23 DIAGNOSIS — Z823 Family history of stroke: Secondary | ICD-10-CM | POA: Insufficient documentation

## 2014-02-23 DIAGNOSIS — Z79899 Other long term (current) drug therapy: Secondary | ICD-10-CM | POA: Insufficient documentation

## 2014-02-23 DIAGNOSIS — Z833 Family history of diabetes mellitus: Secondary | ICD-10-CM | POA: Insufficient documentation

## 2014-02-23 DIAGNOSIS — E669 Obesity, unspecified: Secondary | ICD-10-CM | POA: Insufficient documentation

## 2014-02-23 DIAGNOSIS — I5042 Chronic combined systolic (congestive) and diastolic (congestive) heart failure: Secondary | ICD-10-CM | POA: Insufficient documentation

## 2014-02-23 DIAGNOSIS — Z888 Allergy status to other drugs, medicaments and biological substances status: Secondary | ICD-10-CM | POA: Insufficient documentation

## 2014-02-23 DIAGNOSIS — Z881 Allergy status to other antibiotic agents status: Secondary | ICD-10-CM | POA: Insufficient documentation

## 2014-02-23 DIAGNOSIS — Z955 Presence of coronary angioplasty implant and graft: Secondary | ICD-10-CM | POA: Insufficient documentation

## 2014-02-23 DIAGNOSIS — Z8249 Family history of ischemic heart disease and other diseases of the circulatory system: Secondary | ICD-10-CM | POA: Insufficient documentation

## 2014-02-23 DIAGNOSIS — E785 Hyperlipidemia, unspecified: Secondary | ICD-10-CM | POA: Insufficient documentation

## 2014-02-23 DIAGNOSIS — Z6832 Body mass index (BMI) 32.0-32.9, adult: Secondary | ICD-10-CM | POA: Insufficient documentation

## 2014-02-23 NOTE — Telephone Encounter (Signed)
-----   Message from [name], MD sent at 02/23/2014  8:50 AM EST ----- Regarding: RE: ok to start cardiac rehab OK to restart cardiac rehab.  JV ----- Message -----    From: Rowe Pavy, RN    Sent: 02/22/2014   3:35 PM      To: [name], MD Subject: ok to start cardiac rehab                      Dr. Irish Lack  Pt is being seen by you today/this afternoon.  Pt desires to participate in cardiac rehab and has appt for tomorrow for orientation and will begin exercise on Monday.  Pt reports on several mornings taken NTG for chest pain. Pt also drinks a cup of chamomile tea and rest.  Pain is relieved.  Based upon your assessment, should pt proceed with cardiac rehab?  Thanks for your input [employer] RN

## 2014-02-23 NOTE — Progress Notes (Signed)
Cardiac Rehab Medication Review by a Pharmacist  Does the patient  feel that his/her medications are working for him/her?  yes  Has the patient been experiencing any side effects to the medications prescribed?  no  Does the patient measure his/her own blood pressure or blood glucose at home?  Yes. sBP running 100-120 on average.  Does the patient have any problems obtaining medications due to transportation or finances?   no  Understanding of regimen: excellent Understanding of indications: excellent Potential of compliance: excellent    Pharmacist comments: Pt is a pleasant 13 yof who presents today to cardiac rehab for review of her medications. Patient has a great recall of everything she takes, when she takes it, and had a recent appointment with Dr. Irish Lack. Pt has no complaints today or issues picking up her medications.  Teruo Stilley E. Mata Rowen, Pharm.D Clinical Pharmacy Resident Pager: [phone] 02/23/2014 8:36 AM      Mallie Giambra, Harlon Flor 02/23/2014 8:31 AM

## 2014-02-27 ENCOUNTER — Encounter (HOSPITAL_COMMUNITY)
Admission: RE | Admit: 2014-02-27 | Discharge: 2014-02-27 | Disposition: A | Discharge status: Home / Self Care | Payer: Medicare Other | Source: Ambulatory Visit | Attending: Interventional Cardiology | Admitting: Interventional Cardiology

## 2014-02-27 ENCOUNTER — Encounter (HOSPITAL_COMMUNITY): Payer: Self-pay

## 2014-02-27 DIAGNOSIS — I10 Essential (primary) hypertension: Secondary | ICD-10-CM | POA: Diagnosis not present

## 2014-02-27 DIAGNOSIS — Z5189 Encounter for other specified aftercare: Secondary | ICD-10-CM | POA: Diagnosis not present

## 2014-02-27 DIAGNOSIS — Z79899 Other long term (current) drug therapy: Secondary | ICD-10-CM | POA: Diagnosis not present

## 2014-02-27 DIAGNOSIS — I200 Unstable angina: Secondary | ICD-10-CM | POA: Diagnosis not present

## 2014-02-27 DIAGNOSIS — Z888 Allergy status to other drugs, medicaments and biological substances status: Secondary | ICD-10-CM | POA: Diagnosis not present

## 2014-02-27 DIAGNOSIS — E785 Hyperlipidemia, unspecified: Secondary | ICD-10-CM | POA: Diagnosis not present

## 2014-02-27 DIAGNOSIS — I739 Peripheral vascular disease, unspecified: Secondary | ICD-10-CM | POA: Diagnosis not present

## 2014-02-27 DIAGNOSIS — Z950 Presence of cardiac pacemaker: Secondary | ICD-10-CM | POA: Diagnosis not present

## 2014-02-27 DIAGNOSIS — Z8249 Family history of ischemic heart disease and other diseases of the circulatory system: Secondary | ICD-10-CM | POA: Diagnosis not present

## 2014-02-27 DIAGNOSIS — Z7901 Long term (current) use of anticoagulants: Secondary | ICD-10-CM | POA: Diagnosis not present

## 2014-02-27 DIAGNOSIS — Z7982 Long term (current) use of aspirin: Secondary | ICD-10-CM | POA: Diagnosis not present

## 2014-02-27 DIAGNOSIS — I4891 Unspecified atrial fibrillation: Secondary | ICD-10-CM | POA: Diagnosis not present

## 2014-02-27 DIAGNOSIS — I5042 Chronic combined systolic (congestive) and diastolic (congestive) heart failure: Secondary | ICD-10-CM | POA: Diagnosis not present

## 2014-02-27 DIAGNOSIS — Z881 Allergy status to other antibiotic agents status: Secondary | ICD-10-CM | POA: Diagnosis not present

## 2014-02-27 DIAGNOSIS — I259 Chronic ischemic heart disease, unspecified: Secondary | ICD-10-CM | POA: Diagnosis not present

## 2014-02-27 DIAGNOSIS — Z823 Family history of stroke: Secondary | ICD-10-CM | POA: Diagnosis not present

## 2014-02-27 DIAGNOSIS — I2510 Atherosclerotic heart disease of native coronary artery without angina pectoris: Secondary | ICD-10-CM | POA: Diagnosis not present

## 2014-02-27 DIAGNOSIS — Z955 Presence of coronary angioplasty implant and graft: Secondary | ICD-10-CM | POA: Diagnosis not present

## 2014-02-27 DIAGNOSIS — Z833 Family history of diabetes mellitus: Secondary | ICD-10-CM | POA: Diagnosis not present

## 2014-02-27 DIAGNOSIS — I252 Old myocardial infarction: Secondary | ICD-10-CM | POA: Diagnosis not present

## 2014-02-27 DIAGNOSIS — E669 Obesity, unspecified: Secondary | ICD-10-CM | POA: Diagnosis not present

## 2014-02-27 DIAGNOSIS — Z6832 Body mass index (BMI) 32.0-32.9, adult: Secondary | ICD-10-CM | POA: Diagnosis not present

## 2014-02-27 DIAGNOSIS — Z7902 Long term (current) use of antithrombotics/antiplatelets: Secondary | ICD-10-CM | POA: Diagnosis not present

## 2014-02-27 DIAGNOSIS — G4733 Obstructive sleep apnea (adult) (pediatric): Secondary | ICD-10-CM | POA: Diagnosis not present

## 2014-02-27 NOTE — Progress Notes (Signed)
Pt started cardiac rehab today.  Pt tolerated light exercise without difficulty.  VSS, telemetry-atrial fibrillation, vent. Paced. Asymptomatic.  PHQ-0.  Pt exhibits positive coping skills, hopeful outlooks and supportive family.  Pt goals for cardiac rehab are to be able to walk further distances, have more endurance and increased confidence with exercise/activity abilities.  Pt oriented to exercise equipment and routine.  Understanding verbalized.

## 2014-03-01 ENCOUNTER — Encounter (HOSPITAL_COMMUNITY)
Admission: RE | Admit: 2014-03-01 | Discharge: 2014-03-01 | Disposition: A | Discharge status: Home / Self Care | Payer: Medicare Other | Source: Ambulatory Visit | Attending: Interventional Cardiology | Admitting: Interventional Cardiology

## 2014-03-01 DIAGNOSIS — Z955 Presence of coronary angioplasty implant and graft: Secondary | ICD-10-CM | POA: Diagnosis not present

## 2014-03-01 DIAGNOSIS — I200 Unstable angina: Secondary | ICD-10-CM | POA: Diagnosis not present

## 2014-03-01 DIAGNOSIS — I2510 Atherosclerotic heart disease of native coronary artery without angina pectoris: Secondary | ICD-10-CM | POA: Diagnosis not present

## 2014-03-01 DIAGNOSIS — I5042 Chronic combined systolic (congestive) and diastolic (congestive) heart failure: Secondary | ICD-10-CM | POA: Diagnosis not present

## 2014-03-01 DIAGNOSIS — Z5189 Encounter for other specified aftercare: Secondary | ICD-10-CM | POA: Diagnosis not present

## 2014-03-01 DIAGNOSIS — I259 Chronic ischemic heart disease, unspecified: Secondary | ICD-10-CM | POA: Diagnosis not present

## 2014-03-02 ENCOUNTER — Telehealth: Payer: Self-pay | Admitting: Interventional Cardiology

## 2014-03-02 NOTE — Telephone Encounter (Signed)
Forwarding to Dr. Irish Lack for review. I am not sure, if she is symptom free, could she miss the appointment on 2/3 and when should she come back in?

## 2014-03-02 NOTE — Telephone Encounter (Signed)
New message     Patient needs to know if she needs to keep the appt on 03/08/14 300pm with [name]. She says she was just in there and may not be able to make the appt time and date due to other obligations. (She has cardiac rehab and its usually 2 hours and need to know if she could come in early)  Please give patient a call back. Thanks

## 2014-03-02 NOTE — Telephone Encounter (Signed)
OK to skip appt if she is feeling well.

## 2014-03-03 ENCOUNTER — Encounter (HOSPITAL_COMMUNITY)
Admission: RE | Admit: 2014-03-03 | Discharge: 2014-03-03 | Disposition: A | Discharge status: Home / Self Care | Payer: Medicare Other | Source: Ambulatory Visit | Attending: Interventional Cardiology | Admitting: Interventional Cardiology

## 2014-03-03 DIAGNOSIS — I200 Unstable angina: Secondary | ICD-10-CM | POA: Diagnosis not present

## 2014-03-03 DIAGNOSIS — Z5189 Encounter for other specified aftercare: Secondary | ICD-10-CM | POA: Diagnosis not present

## 2014-03-03 DIAGNOSIS — I5042 Chronic combined systolic (congestive) and diastolic (congestive) heart failure: Secondary | ICD-10-CM | POA: Diagnosis not present

## 2014-03-03 DIAGNOSIS — Z955 Presence of coronary angioplasty implant and graft: Secondary | ICD-10-CM | POA: Diagnosis not present

## 2014-03-03 DIAGNOSIS — I259 Chronic ischemic heart disease, unspecified: Secondary | ICD-10-CM | POA: Diagnosis not present

## 2014-03-03 DIAGNOSIS — I2510 Atherosclerotic heart disease of native coronary artery without angina pectoris: Secondary | ICD-10-CM | POA: Diagnosis not present

## 2014-03-03 NOTE — Telephone Encounter (Signed)
Spoke with pt in regards to appt on 03/08/14. Pt stated that she has Cardiac Rehab that day and that she and her husband are sharing a car right now so there was no way she would be able to make the appt. I let her know that Dr. Irish Lack said it was ok for her to skip the appt if she is feeling well. Pt stated that she has felt great the last few days and has been sleeping much better.   Pt states that she has recently started Cardiac Rehab and that it is going very well.  Pt says that if Truitt Merle, NP would like for her to reschedule the appt to just let her know and she would be glad to do that.

## 2014-03-06 ENCOUNTER — Encounter (HOSPITAL_COMMUNITY)
Admission: RE | Admit: 2014-03-06 | Discharge: 2014-03-06 | Disposition: A | Discharge status: Home / Self Care | Payer: Medicare Other | Source: Ambulatory Visit | Attending: Interventional Cardiology | Admitting: Interventional Cardiology

## 2014-03-06 DIAGNOSIS — Z955 Presence of coronary angioplasty implant and graft: Secondary | ICD-10-CM | POA: Diagnosis not present

## 2014-03-06 NOTE — Telephone Encounter (Signed)
OK with me.

## 2014-03-08 ENCOUNTER — Ambulatory Visit: Payer: Medicare Other | Admitting: Nurse Practitioner

## 2014-03-08 ENCOUNTER — Encounter (HOSPITAL_COMMUNITY)
Admission: RE | Admit: 2014-03-08 | Discharge: 2014-03-08 | Disposition: A | Discharge status: Home / Self Care | Payer: Medicare Other | Source: Ambulatory Visit | Attending: Interventional Cardiology | Admitting: Interventional Cardiology

## 2014-03-08 DIAGNOSIS — Z955 Presence of coronary angioplasty implant and graft: Secondary | ICD-10-CM | POA: Diagnosis not present

## 2014-03-09 DIAGNOSIS — H3531 Nonexudative age-related macular degeneration: Secondary | ICD-10-CM | POA: Diagnosis not present

## 2014-03-10 ENCOUNTER — Encounter (HOSPITAL_COMMUNITY)
Admission: RE | Admit: 2014-03-10 | Discharge: 2014-03-10 | Disposition: A | Discharge status: Home / Self Care | Payer: Medicare Other | Source: Ambulatory Visit | Attending: Interventional Cardiology | Admitting: Interventional Cardiology

## 2014-03-10 DIAGNOSIS — Z955 Presence of coronary angioplasty implant and graft: Secondary | ICD-10-CM | POA: Diagnosis not present

## 2014-03-13 ENCOUNTER — Encounter (HOSPITAL_COMMUNITY)
Admission: RE | Admit: 2014-03-13 | Discharge: 2014-03-13 | Disposition: A | Discharge status: Home / Self Care | Payer: Medicare Other | Source: Ambulatory Visit | Attending: Interventional Cardiology | Admitting: Interventional Cardiology

## 2014-03-13 DIAGNOSIS — Z955 Presence of coronary angioplasty implant and graft: Secondary | ICD-10-CM | POA: Diagnosis not present

## 2014-03-15 ENCOUNTER — Encounter (HOSPITAL_COMMUNITY): Payer: Medicare Other

## 2014-03-17 ENCOUNTER — Encounter (HOSPITAL_COMMUNITY)
Admission: RE | Admit: 2014-03-17 | Discharge: 2014-03-17 | Disposition: A | Discharge status: Home / Self Care | Payer: Medicare Other | Source: Ambulatory Visit | Attending: Interventional Cardiology | Admitting: Interventional Cardiology

## 2014-03-17 DIAGNOSIS — Z955 Presence of coronary angioplasty implant and graft: Secondary | ICD-10-CM | POA: Diagnosis not present

## 2014-03-20 ENCOUNTER — Encounter (HOSPITAL_COMMUNITY)
Admission: RE | Admit: 2014-03-20 | Discharge: 2014-03-20 | Disposition: A | Discharge status: Home / Self Care | Payer: Medicare Other | Source: Ambulatory Visit | Attending: Interventional Cardiology | Admitting: Interventional Cardiology

## 2014-03-20 DIAGNOSIS — Z955 Presence of coronary angioplasty implant and graft: Secondary | ICD-10-CM | POA: Diagnosis not present

## 2014-03-22 ENCOUNTER — Encounter (HOSPITAL_COMMUNITY)
Admission: RE | Admit: 2014-03-22 | Discharge: 2014-03-22 | Disposition: A | Discharge status: Home / Self Care | Payer: Medicare Other | Source: Ambulatory Visit | Attending: Interventional Cardiology | Admitting: Interventional Cardiology

## 2014-03-22 ENCOUNTER — Ambulatory Visit (INDEPENDENT_AMBULATORY_CARE_PROVIDER_SITE_OTHER): Payer: Medicare Other | Admitting: *Deleted

## 2014-03-22 DIAGNOSIS — Z955 Presence of coronary angioplasty implant and graft: Secondary | ICD-10-CM | POA: Diagnosis not present

## 2014-03-22 DIAGNOSIS — I4891 Unspecified atrial fibrillation: Secondary | ICD-10-CM | POA: Diagnosis not present

## 2014-03-22 LAB — POCT INR: INR: 3.1

## 2014-03-23 ENCOUNTER — Other Ambulatory Visit: Payer: Self-pay | Admitting: *Deleted

## 2014-03-23 MED ORDER — FUROSEMIDE 40 MG PO TABS: ORAL_TABLET | ORAL | Status: DC

## 2014-03-24 ENCOUNTER — Encounter (HOSPITAL_COMMUNITY)
Admission: RE | Admit: 2014-03-24 | Discharge: 2014-03-24 | Disposition: A | Discharge status: Home / Self Care | Payer: Medicare Other | Source: Ambulatory Visit | Attending: Interventional Cardiology | Admitting: Interventional Cardiology

## 2014-03-24 DIAGNOSIS — Z955 Presence of coronary angioplasty implant and graft: Secondary | ICD-10-CM | POA: Diagnosis not present

## 2014-03-27 ENCOUNTER — Encounter (HOSPITAL_COMMUNITY)
Admission: RE | Admit: 2014-03-27 | Discharge: 2014-03-27 | Disposition: A | Discharge status: Home / Self Care | Payer: Medicare Other | Source: Ambulatory Visit | Attending: Interventional Cardiology | Admitting: Interventional Cardiology

## 2014-03-27 DIAGNOSIS — Z955 Presence of coronary angioplasty implant and graft: Secondary | ICD-10-CM | POA: Diagnosis not present

## 2014-03-29 ENCOUNTER — Encounter (HOSPITAL_COMMUNITY)
Admission: RE | Admit: 2014-03-29 | Discharge: 2014-03-29 | Disposition: A | Discharge status: Home / Self Care | Payer: Medicare Other | Source: Ambulatory Visit | Attending: Interventional Cardiology | Admitting: Interventional Cardiology

## 2014-03-29 DIAGNOSIS — Z955 Presence of coronary angioplasty implant and graft: Secondary | ICD-10-CM | POA: Diagnosis not present

## 2014-03-30 ENCOUNTER — Telehealth: Payer: Self-pay | Admitting: Internal Medicine

## 2014-03-30 NOTE — Telephone Encounter (Signed)
Pt's heart rate is 49-51bpm since last Thur. Pt gets her rates during cardiac rehab. During exertion, her rate stays in the 50s. Pt is asymptomatic (no angina) but states her energy level is poor. Pt's device is set to VVI/50 per 12/29/12 note in [place]. Pt's concern is that her rate does not increase beyond the 50s during exertion.    Pt aware ROV w/ Dr. [name] 04/10/14. Pt willing to wait until this appt to address if reprogramming necessary.

## 2014-03-30 NOTE — Telephone Encounter (Signed)
New problem    Pt has a pacemaker in her heart and thinks it isn't working properly. Stated it hasn't been above 50 for a week. Please advise pt.

## 2014-03-31 ENCOUNTER — Ambulatory Visit (HOSPITAL_COMMUNITY)
Admission: RE | Admit: 2014-03-31 | Discharge: 2014-03-31 | Disposition: A | Discharge status: Home / Self Care | Payer: Medicare Other | Source: Ambulatory Visit | Attending: Internal Medicine | Admitting: Internal Medicine

## 2014-03-31 ENCOUNTER — Encounter (HOSPITAL_COMMUNITY)
Admission: RE | Admit: 2014-03-31 | Discharge: 2014-03-31 | Disposition: A | Discharge status: Home / Self Care | Payer: Medicare Other | Source: Ambulatory Visit | Attending: Interventional Cardiology | Admitting: Interventional Cardiology

## 2014-03-31 ENCOUNTER — Other Ambulatory Visit: Payer: Self-pay | Admitting: *Deleted

## 2014-03-31 DIAGNOSIS — Z955 Presence of coronary angioplasty implant and graft: Secondary | ICD-10-CM | POA: Diagnosis not present

## 2014-03-31 DIAGNOSIS — I4891 Unspecified atrial fibrillation: Secondary | ICD-10-CM | POA: Diagnosis not present

## 2014-03-31 DIAGNOSIS — R9431 Abnormal electrocardiogram [ECG] [EKG]: Secondary | ICD-10-CM | POA: Insufficient documentation

## 2014-03-31 NOTE — Progress Notes (Signed)
Patient is V paced at 50.  Entry blood pressure 112/58.  Patient denies feeling lightheaded but reports feeling "draggy." The pacer clinic was called exertional heart rates have remained in the 50's even with exertion. Spoke with [name].  Patient denies feeling dizzy or lightheaded today. The pacer clinic will call the patient to set up an appointment to interrogate her pacemaker. Patty [name] left without complaints. Will continue to monitor the patient throughout  the program.

## 2014-03-31 NOTE — Progress Notes (Signed)
12 lead ECG obtained per device clinic. Showed Vpaced 50. Will fax exercise flow sheets to Dr. Jackalyn Lombard office for review. Spoke with Nevin Bloodgood at the device clinic.

## 2014-04-01 ENCOUNTER — Other Ambulatory Visit: Payer: Self-pay | Admitting: Interventional Cardiology

## 2014-04-03 ENCOUNTER — Encounter (HOSPITAL_COMMUNITY)
Admission: RE | Admit: 2014-04-03 | Discharge: 2014-04-03 | Disposition: A | Discharge status: Home / Self Care | Payer: Medicare Other | Source: Ambulatory Visit | Attending: Interventional Cardiology | Admitting: Interventional Cardiology

## 2014-04-03 DIAGNOSIS — Z955 Presence of coronary angioplasty implant and graft: Secondary | ICD-10-CM | POA: Diagnosis not present

## 2014-04-05 ENCOUNTER — Encounter (HOSPITAL_COMMUNITY)
Admission: RE | Admit: 2014-04-05 | Discharge: 2014-04-05 | Disposition: A | Discharge status: Home / Self Care | Payer: Medicare Other | Source: Ambulatory Visit | Attending: Interventional Cardiology | Admitting: Interventional Cardiology

## 2014-04-05 DIAGNOSIS — Z955 Presence of coronary angioplasty implant and graft: Secondary | ICD-10-CM | POA: Insufficient documentation

## 2014-04-07 ENCOUNTER — Encounter (HOSPITAL_COMMUNITY)
Admission: RE | Admit: 2014-04-07 | Discharge: 2014-04-07 | Disposition: A | Discharge status: Home / Self Care | Payer: Medicare Other | Source: Ambulatory Visit | Attending: Interventional Cardiology | Admitting: Interventional Cardiology

## 2014-04-07 DIAGNOSIS — Z955 Presence of coronary angioplasty implant and graft: Secondary | ICD-10-CM | POA: Diagnosis not present

## 2014-04-10 ENCOUNTER — Ambulatory Visit (INDEPENDENT_AMBULATORY_CARE_PROVIDER_SITE_OTHER): Payer: Medicare Other | Admitting: Internal Medicine

## 2014-04-10 ENCOUNTER — Encounter (HOSPITAL_COMMUNITY)
Admission: RE | Admit: 2014-04-10 | Discharge: 2014-04-10 | Disposition: A | Discharge status: Home / Self Care | Payer: Medicare Other | Source: Ambulatory Visit | Attending: Interventional Cardiology | Admitting: Interventional Cardiology

## 2014-04-10 ENCOUNTER — Encounter: Payer: Self-pay | Admitting: Internal Medicine

## 2014-04-10 VITALS — BP 138/80 | HR 51 | Ht 60.0 in | Wt 173.2 lb

## 2014-04-10 DIAGNOSIS — I25119 Atherosclerotic heart disease of native coronary artery with unspecified angina pectoris: Secondary | ICD-10-CM | POA: Diagnosis not present

## 2014-04-10 DIAGNOSIS — I4891 Unspecified atrial fibrillation: Secondary | ICD-10-CM | POA: Diagnosis not present

## 2014-04-10 DIAGNOSIS — I495 Sick sinus syndrome: Secondary | ICD-10-CM | POA: Diagnosis not present

## 2014-04-10 DIAGNOSIS — Z950 Presence of cardiac pacemaker: Secondary | ICD-10-CM

## 2014-04-10 DIAGNOSIS — I10 Essential (primary) hypertension: Secondary | ICD-10-CM | POA: Diagnosis not present

## 2014-04-10 DIAGNOSIS — Z955 Presence of coronary angioplasty implant and graft: Secondary | ICD-10-CM | POA: Diagnosis not present

## 2014-04-10 MED ORDER — DILTIAZEM HCL ER COATED BEADS 180 MG PO CP24: 180.0000 mg | ORAL_CAPSULE | Freq: Every day | ORAL | Status: DC

## 2014-04-10 NOTE — Progress Notes (Signed)
Electrophysiology Office Note   Date:  04/10/2014   ID:  Patty [name], DOB Jun 14, 1930, MRN [id number]  PCP:  Dorian Heckle, MD  Cardiologist:  Dr Irish Lack Primary Electrophysiologist: Thompson Grayer, MD    Chief Complaint  Patient presents with  . Follow-up    AFIB & TB SYNDROME     History of Present Illness: Patty [name] is a 81 y.o. female who presents today for electrophysiology evaluation.   She presents today for device follow-up.  She is doing well.  Though her heart rates have been 50s at cardiac rehab, she has no real2 symptoms with this.  She is pleased that her angina has been much better controlled recently.  She is mostly unaware of her afib.   Today, she denies symptoms of palpitations, chest pain, shortness of breath, orthopnea, PND, lower extremity edema, claudication, dizziness, presyncope, syncope, bleeding, or neurologic sequela. The patient is tolerating medications without difficulties and is otherwise without complaint today.    Past Medical History  Diagnosis Date  . Hypertension   . Hyperlipidemia   . Obesity   . Diverticulitis   . Peripheral vascular disease   . Varicose veins   . Granulosa cell carcinoma     abd; last episode was in 2009  . Tachycardia-bradycardia syndrome     a. s/p leadless pacemaker ([name]) implanted by Dr [name]  . Atrial fibrillation 2013  . OSA on CPAP   . Combined systolic and diastolic heart failure, NYHA class 3   . Coronary artery disease     a. s/p multiple stents  b. LHC 12/14/13 with sig CAD with TO of OM1, 70-80% occusion OM2, 50-70% mLCx, 50-70% pLAD (recent FFR 0.83), diffusely diseased RCA, widely patent stetns in pLAD and LCx.  Marland Kitchen LBBB (left bundle branch block)     chronic  . Pacemaker   . Myocardial infarction 2002   Past Surgical History  Procedure Laterality Date  . Cholecystectomy  1980's  . Salivary gland surgery  2000's    "had a little lump removed; granulosa related; it was benign"  (08/11/2012)  . US echocardiography  11/21/2003    EF 55-60%  . Cardiovascular stress test  03/23/2009    EF 72%  . Abdominal hysterectomy    . Colon surgery  2004    colectomy for diverticulosis  . Granulosa tumor excision  2000; 2003; 2004; 2007    "all in my abdomen including small intestines, outside my ?uterus/etc" (08/11/2012)  . Diagnostic laparoscopy      gallbladder removal; abdominal hernia repair  . Tee without cardioversion  12/31/2011    Procedure: TRANSESOPHAGEAL ECHOCARDIOGRAM (TEE);  Surgeon: [name], MD;  Location: [facility];  Service: Cardiovascular;  Laterality: N/A;  . Cardioversion  12/31/2011    Procedure: CARDIOVERSION;  Surgeon: [name], MD;  Location: [facility];  Service: Cardiovascular;  Laterality: N/A;  . Pacemaker insertion  2/11/4    [name] (SJM) leadless pacemaker (LEADLESS II STUDY PATEINT)  . Cardiac catheterization  09/03/2007    EF 70%; Failed attempt at PCI to OM  . Cardiac catheterization  11/01/2003    EF 70%  . Coronary angioplasty    . Coronary angioplasty with stent placement  2000; 08/11/2012; 11/12/2012    3 + 2 LAD & CFX; 2nd CFX stent 11/12/2012  . Insert / replace / remove pacemaker  03/16/2012    [name] (SJM) leadless pacemaker (LEADLESS II STUDY PATEINT)  . Varicose vein surgery Bilateral 1977  .  Hernia repair  2005    "laparoscopic"  . Cataract extraction, bilateral  2015  . Cardioversion N/A 12/31/2011    Procedure: CARDIOVERSION;  Surgeon: [name], MD;  Location: [facility] CATH LAB;  Service: Cardiovascular;  Laterality: N/A;  . Permanent pacemaker insertion N/A 03/16/2012    Procedure: PERMANENT PACEMAKER INSERTION;  Surgeon: Thompson Grayer, MD;  [facility] CATH LAB;  Service: Cardiovascular;  Laterality: N/A;  . Percutaneous coronary intervention-balloon only  08/04/2012    Procedure: PERCUTANEOUS CORONARY INTERVENTION-BALLOON ONLY;  Surgeon: [name], MD;  Location: [facility] CATH LAB;  Service:  Cardiovascular;;  . Percutaneous coronary rotoblator intervention (pci-r) N/A 08/11/2012    Procedure: PERCUTANEOUS CORONARY ROTOBLATOR INTERVENTION (PCI-R);  Surgeon: [name], MD;  Location: [facility] CATH LAB;  Service: Cardiovascular;  Laterality: N/A;  . Left heart catheterization with coronary angiogram N/A 11/12/2012    Procedure: LEFT HEART CATHETERIZATION WITH CORONARY ANGIOGRAM;  Surgeon: [name], MD;  Location: [facility] CATH LAB;  Service: Cardiovascular;  Laterality: N/A;  . Left heart catheterization with coronary angiogram N/A 10/07/2013    Procedure: LEFT HEART CATHETERIZATION WITH CORONARY ANGIOGRAM;  Surgeon: [name], MD;  Location: [facility] CATH LAB;  Service: Cardiovascular;  Laterality: N/A;  . Percutaneous coronary stent intervention (pci-s)  10/07/2013    Procedure: PERCUTANEOUS CORONARY STENT INTERVENTION (PCI-S);  Surgeon: [name], MD;  Location: [facility] CATH LAB;  Service: Cardiovascular;;  . Fractional flow reserve wire  10/07/2013    Procedure: FRACTIONAL FLOW RESERVE WIRE;  Surgeon: [name], MD;  Location: [facility] CATH LAB;  Service: Cardiovascular;;  . Left heart catheterization with coronary angiogram N/A 12/14/2013    Procedure: LEFT HEART CATHETERIZATION WITH CORONARY ANGIOGRAM;  Surgeon: Sinclair Grooms, MD;  Location: [facility] CATH LAB;  Service: Cardiovascular;  Laterality: N/A;     Current Outpatient Prescriptions  Medication Sig Dispense Refill  . acetaminophen (TYLENOL) 500 MG tablet Take 500 mg by mouth every 8 (eight) hours as needed.     Marland Kitchen atorvastatin (LIPITOR) 80 MG tablet TAKE 1 TABLET (80 MG TOTAL) BY MOUTH EVERY MORNING. 30 tablet 5  . Calcium Carb-Cholecalciferol (CALTRATE 600+D) 600-800 MG-UNIT TABS Take 1 tablet by mouth every morning.     . clopidogrel (PLAVIX) 75 MG tablet TAKE 1 TABLET BY MOUTH EVERY DAY WITH BREAKFAST 30 tablet 3  . Coenzyme Q-10 100 MG capsule Take 200 mg by mouth daily.     Marland Kitchen diltiazem (CARDIZEM CD) 180  MG 24 hr capsule Take 1 capsule (180 mg total) by mouth daily. 90 capsule 3  . furosemide (LASIX) 40 MG tablet Take two tablets (80mg ) every morning and 1 tablet (40mg ) every evening as needed (Patient taking differently: Take two tablets (80mg ) by mouth every morning and 1 tablet (40mg ) by mouth every evening as needed for fluid retention) 90 tablet 1  . isosorbide mononitrate (IMDUR) 60 MG 24 hr tablet TAKE 1 TABLET BY MOUTH EVERY MORNING AND 1/2 TABLET EVERY EVENING 45 tablet 0  . lisinopril (PRINIVIL,ZESTRIL) 5 MG tablet Take 5 mg by mouth 2 (two) times daily.    . metoprolol tartrate (LOPRESSOR) 100 MG tablet Take 1 tablet (100 mg total) by mouth 2 (two) times daily. 60 tablet 11  . Multiple Vitamin (MULTIVITAMIN WITH MINERALS) TABS tablet Take 1 tablet by mouth every morning.    . Multiple Vitamins-Minerals (OCUVITE PRESERVISION) TABS Take 1 tablet by mouth daily.     Marland Kitchen NITROSTAT 0.4 MG SL tablet PLACE 1 TABLET (  0.4 MG TOTAL) UNDER THE TONGUE EVERY 5 (FIVE) MINUTES AS NEEDED. FOR CHEST PAIN 25 tablet 3  . Omega-3 Fatty Acids (FISH OIL PO) Take 1,200 mg by mouth daily.     . potassium chloride SA (K-DUR,KLOR-CON) 20 MEQ tablet Take 20 mEq by mouth daily.    Marland Kitchen spironolactone (ALDACTONE) 25 MG tablet Take 12.5 mg by mouth at bedtime.     Marland Kitchen warfarin (COUMADIN) 2 MG tablet Take as directed by Coumadin clinic (Patient taking differently: Take 1-2 mg by mouth daily. Take 2mg  daily except 1mg  on Tuesday) 30 tablet 3   No current facility-administered medications for this visit.    Allergies:   Bee venom and Other   Social History:  The patient  reports that she quit smoking about 40 years ago. Her smoking use included Cigarettes. She has a 32 pack-year smoking history. She has never used smokeless tobacco. She reports that she drinks about 3.0 oz of alcohol per week. She reports that she does not use illicit drugs.   Family History:  The patient's family history includes Diabetes in her brother and  father; Heart attack in her brother and father; Hypertension in her brother and father; Kidney failure in her brother; Stroke in her mother.    ROS:  Please see the history of present illness.   All other systems are reviewed and negative.    PHYSICAL EXAM: VS:  BP 138/80 mmHg  Pulse 51  Ht 5' (1.524 m)  Wt 173 lb 3.2 oz (78.563 kg)  BMI 33.83 kg/m2 , BMI Body mass index is 33.83 kg/(m^2). GEN: Well nourished, well developed, in no acute distress HEENT: normal Neck: no JVD, carotid bruits, or masses Cardiac: RRR (paced) Respiratory:  clear to auscultation bilaterally, normal work of breathing GI: soft, nontender, nondistended, + BS MS: no deformity or atrophy Skin: warm and dry  Neuro:  Strength and sensation are intact Psych: euthymic mood, full affect  EKG:  EKG is ordered today. The ekg ordered today shows afib with V pacing  Device interrogation is reviewed today in detail.  See PaceArt for details.   Recent Labs: 12/12/2013: ALT 25 01/05/2014: Pro B Natriuretic peptide (BNP) 1341.0* 01/06/2014: Hemoglobin 12.7; Platelets 192 01/13/2014: TSH 4.65* 02/17/2014: BUN 37*; Creatinine 1.09; Potassium 4.1; Sodium 139    Lipid Panel     Component Value Date/Time   CHOL 125 01/06/2014 0021   TRIG 70 01/06/2014 0021   HDL 64 01/06/2014 0021   CHOLHDL 2.0 01/06/2014 0021   VLDL 14 01/06/2014 0021   LDLCALC 47 01/06/2014 0021     Wt Readings from Last 3 Encounters:  04/10/14 173 lb 3.2 oz (78.563 kg)  02/22/14 171 lb (77.565 kg)  01/13/14 171 lb 12.8 oz (77.928 kg)      Other studies Reviewed: Additional studies/ records that were reviewed today include: cardiac rehab records Review of the above records today demonstrates: V pacing at 50 bpm   ASSESSMENT AND PLAN:  1.  Persistent afib Appropriately anticoagulated for chads2vasc score of at least 5 Rates are recently much better controlled (see below)  2. Bradycardia Today, underlying rhythm appears to be afib  with complete heart block Will reduce cardizem to 180mg  daily today. If histograms reveal that her afib is well rate controlled, will consider reducing to 120mg  daily upon return to see Chanetta Marshall NP in 6 weeks Could consider turning rate response on upon return if she is without anginal symptoms  3. CAD  No ischemic symptoms  Appears to do better with slower heart rates.  I therefore will reduce diltiazem as above. If her angina worsens then we may return to prior diltiazem dosing Last echo was 2013.  Will also repeat echo upon return  4. HTN Stable No change required today  OK to return to cardiac rehab   Current medicines are reviewed at length with the patient today.   The patient does not have concerns regarding her medicines.  The following changes were made today:  none  Follow-up with Chanetta Marshall NP for heart rate evaluation in 6 weeks.  I will see in 3 months  Signed, Thompson Grayer, MD  04/10/2014 10:37 PM     [address] [place] [address] [phone] (office) [phone] (fax)

## 2014-04-10 NOTE — Progress Notes (Signed)
Patty [name] 81 y.o. female Nutrition Note Spoke with pt. Pt well-known to this writer from previous admission  Nutrition Plan and Nutrition Survey goals reviewed with pt. Pt is following Step 2 of the Therapeutic Lifestyle Changes diet. Pt reports watching her sodium intake by using mostly fresh/frozen vegetables and eating very few processed foods. Pt does not add salt to food when cooking.  Pt expressed understanding of the information reviewed. Pt aware of nutrition education classes offered and reports having previously attended all nutrition classes during her last admission "a year or so ago."  Nutrition Diagnosis ? Food-and nutrition-related knowledge deficit related to lack of exposure to information as related to diagnosis of: ? CVD ?  ? Obesity related to excessive energy intake as evidenced by a BMI of 32.4  Nutrition RX/ Estimated Daily Nutrition Needs for: wt loss 1200-1400 Kcal, 30-35 gm fat, 8-11 gm sat fat, 1.1-1.4 gm trans-fat, <1500 mg sodium, 150-175 gm CHO   Nutrition Intervention ? Pt's individual nutrition plan reviewed with pt. ? Benefits of adopting Therapeutic Lifestyle Changes discussed when Medficts reviewed. ? Pt to attend the Portion Distortion class ? Pt to attend the Diabetes Q & A class ? Continue client-centered nutrition education by RD, as part of interdisciplinary care. Goal(s) ? Pt to identify food quantities necessary to achieve: ? wt loss to a goal wt of 156-165 lb (70.9-75.1 kg) at graduation from cardiac rehab.  ? Pt to describe the benefit of including fruits, vegetables, whole grains, and low-fat dairy products in a heart healthy meal plan. Monitor and Evaluate progress toward nutrition goal with team. Nutrition Risk: Change to Moderate Derek Mound, M.Ed, RD, LDN, CDE 04/10/2014 4:05 PM

## 2014-04-10 NOTE — Patient Instructions (Addendum)
     Your physician recommends that you schedule a follow-up appointment in: 6 weeks with Chanetta Marshall, NP and 3 months with Dr [name]   Your physician has recommended you make the following change in your medication:  1) Decrease Diltiazem to 180mg  daily

## 2014-04-12 ENCOUNTER — Encounter (HOSPITAL_COMMUNITY)
Admission: RE | Admit: 2014-04-12 | Discharge: 2014-04-12 | Disposition: A | Discharge status: Home / Self Care | Payer: Medicare Other | Source: Ambulatory Visit | Attending: Interventional Cardiology | Admitting: Interventional Cardiology

## 2014-04-12 DIAGNOSIS — Z955 Presence of coronary angioplasty implant and graft: Secondary | ICD-10-CM | POA: Diagnosis not present

## 2014-04-12 LAB — MDC_IDC_ENUM_SESS_TYPE_INCLINIC
Lead Channel Sensing Intrinsic Amplitude: 5 mV
Lead Channel Setting Pacing Amplitude: 2 V
Lead Channel Setting Pacing Pulse Width: 0.4 ms
Lead Channel Setting Sensing Sensitivity: 2 mV
MDC IDC MSMT LEADCHNL RV PACING THRESHOLD AMPLITUDE: 0.75 V
MDC IDC MSMT LEADCHNL RV PACING THRESHOLD PULSEWIDTH: 0.4 ms
MDC IDC PG SERIAL: 1096

## 2014-04-14 ENCOUNTER — Encounter (HOSPITAL_COMMUNITY)
Admission: RE | Admit: 2014-04-14 | Discharge: 2014-04-14 | Disposition: A | Discharge status: Home / Self Care | Payer: Medicare Other | Source: Ambulatory Visit | Attending: Interventional Cardiology | Admitting: Interventional Cardiology

## 2014-04-14 DIAGNOSIS — Z955 Presence of coronary angioplasty implant and graft: Secondary | ICD-10-CM | POA: Diagnosis not present

## 2014-04-17 ENCOUNTER — Encounter (HOSPITAL_COMMUNITY)
Admission: RE | Admit: 2014-04-17 | Discharge: 2014-04-17 | Disposition: A | Discharge status: Home / Self Care | Payer: Medicare Other | Source: Ambulatory Visit | Attending: Interventional Cardiology | Admitting: Interventional Cardiology

## 2014-04-17 DIAGNOSIS — Z955 Presence of coronary angioplasty implant and graft: Secondary | ICD-10-CM | POA: Diagnosis not present

## 2014-04-19 ENCOUNTER — Encounter (HOSPITAL_COMMUNITY)
Admission: RE | Admit: 2014-04-19 | Discharge: 2014-04-19 | Disposition: A | Discharge status: Home / Self Care | Payer: Medicare Other | Source: Ambulatory Visit | Attending: Interventional Cardiology | Admitting: Interventional Cardiology

## 2014-04-19 ENCOUNTER — Ambulatory Visit (INDEPENDENT_AMBULATORY_CARE_PROVIDER_SITE_OTHER): Payer: Medicare Other | Admitting: *Deleted

## 2014-04-19 ENCOUNTER — Encounter: Payer: Self-pay | Admitting: *Deleted

## 2014-04-19 DIAGNOSIS — Z955 Presence of coronary angioplasty implant and graft: Secondary | ICD-10-CM | POA: Diagnosis not present

## 2014-04-19 DIAGNOSIS — I4891 Unspecified atrial fibrillation: Secondary | ICD-10-CM | POA: Diagnosis not present

## 2014-04-19 LAB — POCT INR: INR: 2.4

## 2014-04-19 NOTE — Progress Notes (Signed)
(  late entry for 04/17/2014)  PSYCHOSOCIAL ASSESSMENT  Pt psychosocial assessment reveals no barriers to rehab participation.  Pt quality of life is slightly altered by her physical constraints which limits her ability to perform tasks as prior to her illness. Pt activity was extremely altered prior to her most recent cardiac intervention.  Pt is pleased anginal symptoms are now resolved. Although pt still feels somewhat limited by fatigue.  These symptoms were recently discussed with Dr. [name] and medication adjustments made accordingly.    Pt exhibits positive coping skills and has supportive family.  Offered emotional support and reassurance.  Will continue to monitor.

## 2014-04-21 ENCOUNTER — Encounter (HOSPITAL_COMMUNITY)
Admission: RE | Admit: 2014-04-21 | Discharge: 2014-04-21 | Disposition: A | Discharge status: Home / Self Care | Payer: Medicare Other | Source: Ambulatory Visit | Attending: Interventional Cardiology | Admitting: Interventional Cardiology

## 2014-04-21 DIAGNOSIS — Z955 Presence of coronary angioplasty implant and graft: Secondary | ICD-10-CM | POA: Diagnosis not present

## 2014-04-24 ENCOUNTER — Encounter (HOSPITAL_COMMUNITY)
Admission: RE | Admit: 2014-04-24 | Discharge: 2014-04-24 | Disposition: A | Discharge status: Home / Self Care | Payer: Medicare Other | Source: Ambulatory Visit | Attending: Interventional Cardiology | Admitting: Interventional Cardiology

## 2014-04-24 DIAGNOSIS — Z955 Presence of coronary angioplasty implant and graft: Secondary | ICD-10-CM | POA: Diagnosis not present

## 2014-04-25 ENCOUNTER — Telehealth: Payer: Self-pay | Admitting: Oncology

## 2014-04-25 NOTE — Telephone Encounter (Signed)
pt cld & left voicemail in regards to next appt-cld & left pt a message & gave pt appt time & date

## 2014-04-26 ENCOUNTER — Encounter (HOSPITAL_COMMUNITY)
Admission: RE | Admit: 2014-04-26 | Discharge: 2014-04-26 | Disposition: A | Discharge status: Home / Self Care | Payer: Medicare Other | Source: Ambulatory Visit | Attending: Interventional Cardiology | Admitting: Interventional Cardiology

## 2014-04-26 DIAGNOSIS — Z955 Presence of coronary angioplasty implant and graft: Secondary | ICD-10-CM | POA: Diagnosis not present

## 2014-04-27 ENCOUNTER — Encounter: Payer: Self-pay | Admitting: Internal Medicine

## 2014-04-28 ENCOUNTER — Encounter (HOSPITAL_COMMUNITY): Payer: Medicare Other

## 2014-04-28 ENCOUNTER — Telehealth (HOSPITAL_COMMUNITY): Payer: Self-pay | Admitting: Cardiac Rehabilitation

## 2014-04-28 NOTE — Telephone Encounter (Signed)
pc received from pt she will absent from cardiac rehab due to diarrhea last night.  Last loose stool 1am.  Pt instructed to increase PO fluid intake today. Pt also instructed OK to return to cardiac rehab 48 hours after GI symptoms resolve.  Understanding verbalized.

## 2014-04-28 NOTE — Telephone Encounter (Signed)
This encounter was created in error - please disregard.

## 2014-05-01 ENCOUNTER — Other Ambulatory Visit: Payer: Self-pay | Admitting: Interventional Cardiology

## 2014-05-01 ENCOUNTER — Other Ambulatory Visit: Payer: Medicare Other | Admitting: Oncology

## 2014-05-01 ENCOUNTER — Telehealth: Payer: Self-pay | Admitting: Oncology

## 2014-05-01 ENCOUNTER — Encounter (HOSPITAL_COMMUNITY)
Admission: RE | Admit: 2014-05-01 | Discharge: 2014-05-01 | Disposition: A | Discharge status: Home / Self Care | Payer: Medicare Other | Source: Ambulatory Visit | Attending: Interventional Cardiology | Admitting: Interventional Cardiology

## 2014-05-01 DIAGNOSIS — Z955 Presence of coronary angioplasty implant and graft: Secondary | ICD-10-CM | POA: Diagnosis not present

## 2014-05-01 NOTE — Telephone Encounter (Signed)
Dr. Marko Plume sent a POF to scheduling for appointment to abe scheduled 05-12-14 or first available.

## 2014-05-01 NOTE — Telephone Encounter (Signed)
Patient called regarding finding a lump in her abdomen.  Patient states her current cancer presents itself by small tumors.  She is scheduled for annual visit on May 23 to see Dr. Marko Plume, she is not comfortable waiting 2 months.  Would like to be seen earlier.  Please send a POF for an appointment to be seen in the next week or so if you feel this is appropriate.

## 2014-05-03 ENCOUNTER — Encounter (HOSPITAL_COMMUNITY)
Admission: RE | Admit: 2014-05-03 | Discharge: 2014-05-03 | Disposition: A | Discharge status: Home / Self Care | Payer: Medicare Other | Source: Ambulatory Visit | Attending: Interventional Cardiology | Admitting: Interventional Cardiology

## 2014-05-03 DIAGNOSIS — Z955 Presence of coronary angioplasty implant and graft: Secondary | ICD-10-CM | POA: Diagnosis not present

## 2014-05-05 ENCOUNTER — Encounter (HOSPITAL_COMMUNITY)
Admission: RE | Admit: 2014-05-05 | Discharge: 2014-05-05 | Disposition: A | Discharge status: Home / Self Care | Payer: Medicare Other | Source: Ambulatory Visit | Attending: Interventional Cardiology | Admitting: Interventional Cardiology

## 2014-05-05 ENCOUNTER — Telehealth: Payer: Self-pay | Admitting: Oncology

## 2014-05-05 DIAGNOSIS — Z955 Presence of coronary angioplasty implant and graft: Secondary | ICD-10-CM | POA: Diagnosis not present

## 2014-05-05 NOTE — Telephone Encounter (Signed)
Patient called and was checking in regarding appointment schedule. Schedule patient for next available for 04/11. Patient confirm and will adjust Cardiac appointment for that day.

## 2014-05-08 ENCOUNTER — Encounter (HOSPITAL_COMMUNITY)
Admission: RE | Admit: 2014-05-08 | Discharge: 2014-05-08 | Disposition: A | Discharge status: Home / Self Care | Payer: Medicare Other | Source: Ambulatory Visit | Attending: Interventional Cardiology | Admitting: Interventional Cardiology

## 2014-05-08 DIAGNOSIS — Z955 Presence of coronary angioplasty implant and graft: Secondary | ICD-10-CM | POA: Diagnosis not present

## 2014-05-10 ENCOUNTER — Other Ambulatory Visit: Payer: Self-pay | Admitting: Oncology

## 2014-05-10 ENCOUNTER — Encounter (HOSPITAL_COMMUNITY)
Admission: RE | Admit: 2014-05-10 | Discharge: 2014-05-10 | Disposition: A | Discharge status: Home / Self Care | Payer: Medicare Other | Source: Ambulatory Visit | Attending: Interventional Cardiology | Admitting: Interventional Cardiology

## 2014-05-10 DIAGNOSIS — D3910 Neoplasm of uncertain behavior of unspecified ovary: Secondary | ICD-10-CM

## 2014-05-10 DIAGNOSIS — Z955 Presence of coronary angioplasty implant and graft: Secondary | ICD-10-CM | POA: Diagnosis not present

## 2014-05-12 ENCOUNTER — Telehealth: Payer: Self-pay | Admitting: Interventional Cardiology

## 2014-05-12 ENCOUNTER — Encounter (HOSPITAL_COMMUNITY)
Admission: RE | Admit: 2014-05-12 | Discharge: 2014-05-12 | Disposition: A | Discharge status: Home / Self Care | Payer: Medicare Other | Source: Ambulatory Visit | Attending: Interventional Cardiology | Admitting: Interventional Cardiology

## 2014-05-12 NOTE — Telephone Encounter (Signed)
Called as pt has CP last evening about 7 pm and took on Nitro.   Spoke with pt she feels well today and has not had any additional episodes of CP.  Pt state she thinks she over did it yesterday moving boxes and cleaning house to prepare for her daughter to come to town.  Pt state she is not going to over do it in rehab today but she feels fine to participate.   Instructed pt to listen to her body and if she starts to not feel right or have CP to leave [facility] with Cardiac Rehab know and the situation can be address.  Pt stated understanding and that she was going to rest the remainder of the day after rehab and would call back if she feels she needs to. No additional questions at this time.

## 2014-05-12 NOTE — Telephone Encounter (Signed)
New message      Pt is there to exercise.  She told [name] she had chest pain last night around 7pm----took a nitro and now want to exercise

## 2014-05-12 NOTE — Progress Notes (Signed)
Patient reported having chest pain yesterday evening at [name] said she took a sublingual nitroglycerin with relief. Patient reported her pain as a 4 on a 1-10 scale. Patty [name] reported that she was diaphoretic and has not had experienced any chest pain since January.  Blood pressure 108/52. Patient was not experiencing any chest pain today. Dr Hebert Soho office called and notified. Patty [name] spoke with Maudie Mercury and was told she may exercise today just to keep it light. Patty [name] exercised without any complaints during exercise. Telemetry rhythm Atrial fib with Vpacing. Will continue to monitor the patient throughout  the program.

## 2014-05-13 DIAGNOSIS — R079 Chest pain, unspecified: Secondary | ICD-10-CM | POA: Diagnosis not present

## 2014-05-14 ENCOUNTER — Emergency Department (HOSPITAL_COMMUNITY): Payer: Medicare Other

## 2014-05-14 ENCOUNTER — Encounter (HOSPITAL_COMMUNITY): Payer: Self-pay | Admitting: Emergency Medicine

## 2014-05-14 ENCOUNTER — Other Ambulatory Visit: Payer: Self-pay

## 2014-05-14 ENCOUNTER — Inpatient Hospital Stay (HOSPITAL_COMMUNITY)
Admission: EM | Admit: 2014-05-14 | Discharge: 2014-05-17 | DRG: 247 | Disposition: A | Discharge status: Home / Self Care | Payer: Medicare Other | Attending: Internal Medicine | Admitting: Internal Medicine

## 2014-05-14 DIAGNOSIS — G4733 Obstructive sleep apnea (adult) (pediatric): Secondary | ICD-10-CM | POA: Diagnosis present

## 2014-05-14 DIAGNOSIS — I25110 Atherosclerotic heart disease of native coronary artery with unstable angina pectoris: Secondary | ICD-10-CM | POA: Diagnosis not present

## 2014-05-14 DIAGNOSIS — I959 Hypotension, unspecified: Secondary | ICD-10-CM | POA: Diagnosis not present

## 2014-05-14 DIAGNOSIS — Z955 Presence of coronary angioplasty implant and graft: Secondary | ICD-10-CM

## 2014-05-14 DIAGNOSIS — I701 Atherosclerosis of renal artery: Secondary | ICD-10-CM | POA: Diagnosis present

## 2014-05-14 DIAGNOSIS — I5042 Chronic combined systolic (congestive) and diastolic (congestive) heart failure: Secondary | ICD-10-CM | POA: Diagnosis present

## 2014-05-14 DIAGNOSIS — R079 Chest pain, unspecified: Secondary | ICD-10-CM | POA: Diagnosis not present

## 2014-05-14 DIAGNOSIS — I482 Chronic atrial fibrillation: Secondary | ICD-10-CM | POA: Diagnosis present

## 2014-05-14 DIAGNOSIS — I252 Old myocardial infarction: Secondary | ICD-10-CM

## 2014-05-14 DIAGNOSIS — I2583 Coronary atherosclerosis due to lipid rich plaque: Secondary | ICD-10-CM

## 2014-05-14 DIAGNOSIS — Z7902 Long term (current) use of antithrombotics/antiplatelets: Secondary | ICD-10-CM

## 2014-05-14 DIAGNOSIS — Z87891 Personal history of nicotine dependence: Secondary | ICD-10-CM | POA: Diagnosis not present

## 2014-05-14 DIAGNOSIS — I495 Sick sinus syndrome: Secondary | ICD-10-CM | POA: Diagnosis present

## 2014-05-14 DIAGNOSIS — E669 Obesity, unspecified: Secondary | ICD-10-CM | POA: Diagnosis present

## 2014-05-14 DIAGNOSIS — I739 Peripheral vascular disease, unspecified: Secondary | ICD-10-CM | POA: Diagnosis present

## 2014-05-14 DIAGNOSIS — I10 Essential (primary) hypertension: Secondary | ICD-10-CM | POA: Diagnosis present

## 2014-05-14 DIAGNOSIS — R0789 Other chest pain: Secondary | ICD-10-CM | POA: Diagnosis not present

## 2014-05-14 DIAGNOSIS — I5040 Unspecified combined systolic (congestive) and diastolic (congestive) heart failure: Secondary | ICD-10-CM | POA: Diagnosis present

## 2014-05-14 DIAGNOSIS — Z950 Presence of cardiac pacemaker: Secondary | ICD-10-CM

## 2014-05-14 DIAGNOSIS — I2510 Atherosclerotic heart disease of native coronary artery without angina pectoris: Secondary | ICD-10-CM | POA: Diagnosis not present

## 2014-05-14 DIAGNOSIS — I4892 Unspecified atrial flutter: Secondary | ICD-10-CM | POA: Diagnosis not present

## 2014-05-14 DIAGNOSIS — Z7901 Long term (current) use of anticoagulants: Secondary | ICD-10-CM | POA: Diagnosis not present

## 2014-05-14 DIAGNOSIS — Z66 Do not resuscitate: Secondary | ICD-10-CM | POA: Diagnosis present

## 2014-05-14 DIAGNOSIS — I209 Angina pectoris, unspecified: Secondary | ICD-10-CM | POA: Diagnosis not present

## 2014-05-14 DIAGNOSIS — E785 Hyperlipidemia, unspecified: Secondary | ICD-10-CM | POA: Diagnosis present

## 2014-05-14 DIAGNOSIS — Z6833 Body mass index (BMI) 33.0-33.9, adult: Secondary | ICD-10-CM | POA: Diagnosis not present

## 2014-05-14 DIAGNOSIS — I447 Left bundle-branch block, unspecified: Secondary | ICD-10-CM | POA: Diagnosis present

## 2014-05-14 DIAGNOSIS — Z9989 Dependence on other enabling machines and devices: Secondary | ICD-10-CM

## 2014-05-14 HISTORY — DX: Atherosclerosis of renal artery: I70.1

## 2014-05-14 LAB — CBC WITH DIFFERENTIAL/PLATELET
BASOS ABS: 0 10*3/uL (ref 0.0–0.1)
Basophils Relative: 0 % (ref 0–1)
EOS PCT: 3 % (ref 0–5)
Eosinophils Absolute: 0.2 10*3/uL (ref 0.0–0.7)
HEMATOCRIT: 40.2 % (ref 36.0–46.0)
Hemoglobin: 13.2 g/dL (ref 12.0–15.0)
LYMPHS PCT: 23 % (ref 12–46)
Lymphs Abs: 1.5 10*3/uL (ref 0.7–4.0)
MCH: 31.1 pg (ref 26.0–34.0)
MCHC: 32.8 g/dL (ref 30.0–36.0)
MCV: 94.8 fL (ref 78.0–100.0)
Monocytes Absolute: 0.6 10*3/uL (ref 0.1–1.0)
Monocytes Relative: 9 % (ref 3–12)
NEUTROS ABS: 4.3 10*3/uL (ref 1.7–7.7)
Neutrophils Relative %: 65 % (ref 43–77)
PLATELETS: 199 10*3/uL (ref 150–400)
RBC: 4.24 MIL/uL (ref 3.87–5.11)
RDW: 14.4 % (ref 11.5–15.5)
WBC: 6.6 10*3/uL (ref 4.0–10.5)

## 2014-05-14 LAB — CBC
HEMATOCRIT: 38.5 % (ref 36.0–46.0)
Hemoglobin: 12.6 g/dL (ref 12.0–15.0)
MCH: 31.1 pg (ref 26.0–34.0)
MCHC: 32.7 g/dL (ref 30.0–36.0)
MCV: 95.1 fL (ref 78.0–100.0)
PLATELETS: 184 10*3/uL (ref 150–400)
RBC: 4.05 MIL/uL (ref 3.87–5.11)
RDW: 14.4 % (ref 11.5–15.5)
WBC: 5.9 10*3/uL (ref 4.0–10.5)

## 2014-05-14 LAB — I-STAT CHEM 8, ED
BUN: 43 mg/dL — AB (ref 6–23)
CALCIUM ION: 1.14 mmol/L (ref 1.13–1.30)
CHLORIDE: 105 mmol/L (ref 96–112)
CREATININE: 1.1 mg/dL (ref 0.50–1.10)
Glucose, Bld: 127 mg/dL — ABNORMAL HIGH (ref 70–99)
HEMATOCRIT: 40 % (ref 36.0–46.0)
Hemoglobin: 13.6 g/dL (ref 12.0–15.0)
Potassium: 3.7 mmol/L (ref 3.5–5.1)
SODIUM: 144 mmol/L (ref 135–145)
TCO2: 24 mmol/L (ref 0–100)

## 2014-05-14 LAB — COMPREHENSIVE METABOLIC PANEL
ALBUMIN: 3 g/dL — AB (ref 3.5–5.2)
ALK PHOS: 72 U/L (ref 39–117)
ALT: 18 U/L (ref 0–35)
AST: 26 U/L (ref 0–37)
Anion gap: 7 (ref 5–15)
BUN: 38 mg/dL — AB (ref 6–23)
CHLORIDE: 109 mmol/L (ref 96–112)
CO2: 28 mmol/L (ref 19–32)
CREATININE: 1.1 mg/dL (ref 0.50–1.10)
Calcium: 8.6 mg/dL (ref 8.4–10.5)
GFR calc Af Amer: 53 mL/min — ABNORMAL LOW (ref >90)
GFR calc non Af Amer: 46 mL/min — ABNORMAL LOW (ref >90)
Glucose, Bld: 105 mg/dL — ABNORMAL HIGH (ref 70–99)
Potassium: 3.9 mmol/L (ref 3.5–5.1)
Sodium: 144 mmol/L (ref 135–145)
Total Bilirubin: 0.5 mg/dL (ref 0.3–1.2)
Total Protein: 6.1 g/dL (ref 6.0–8.3)

## 2014-05-14 LAB — PROTIME-INR
INR: 2.8 — ABNORMAL HIGH (ref 0.00–1.49)
Prothrombin Time: 29.7 seconds — ABNORMAL HIGH (ref 11.6–15.2)

## 2014-05-14 LAB — I-STAT TROPONIN, ED: Troponin i, poc: 0.03 ng/mL (ref 0.00–0.08)

## 2014-05-14 LAB — BRAIN NATRIURETIC PEPTIDE: B NATRIURETIC PEPTIDE 5: 386.6 pg/mL — AB (ref 0.0–100.0)

## 2014-05-14 LAB — GLUCOSE, CAPILLARY: GLUCOSE-CAPILLARY: 102 mg/dL — AB (ref 70–99)

## 2014-05-14 LAB — TROPONIN I
TROPONIN I: 0.05 ng/mL — AB (ref <0.031)
Troponin I: 0.07 ng/mL — ABNORMAL HIGH (ref <0.031)
Troponin I: 0.09 ng/mL — ABNORMAL HIGH (ref <0.031)

## 2014-05-14 MED ORDER — ADULT MULTIVITAMIN W/MINERALS CH
1.0000 | ORAL_TABLET | Freq: Every morning | ORAL | Status: DC
  Administered 2014-05-14 – 2014-05-17 (×5): 1 via ORAL
  Filled 2014-05-14 (×4): qty 1

## 2014-05-14 MED ORDER — POTASSIUM CHLORIDE CRYS ER 20 MEQ PO TBCR
20.0000 meq | EXTENDED_RELEASE_TABLET | Freq: Every day | ORAL | Status: DC
  Administered 2014-05-14 – 2014-05-17 (×4): 20 meq via ORAL
  Filled 2014-05-14 (×4): qty 1

## 2014-05-14 MED ORDER — LISINOPRIL 5 MG PO TABS
5.0000 mg | ORAL_TABLET | Freq: Two times a day (BID) | ORAL | Status: DC
  Administered 2014-05-14 – 2014-05-17 (×6): 5 mg via ORAL
  Filled 2014-05-14 (×10): qty 1

## 2014-05-14 MED ORDER — ISOSORBIDE MONONITRATE ER 60 MG PO TB24
60.0000 mg | ORAL_TABLET | Freq: Every day | ORAL | Status: DC
  Administered 2014-05-14 – 2014-05-16 (×3): 60 mg via ORAL
  Filled 2014-05-14 (×5): qty 1

## 2014-05-14 MED ORDER — MORPHINE SULFATE 2 MG/ML IJ SOLN: 1.0000 mg | Freq: Once | INTRAMUSCULAR | Status: AC

## 2014-05-14 MED ORDER — COENZYME Q-10 100 MG PO CAPS: 200.0000 mg | ORAL_CAPSULE | Freq: Every day | ORAL | Status: DC

## 2014-05-14 MED ORDER — OCUVITE PRESERVISION PO TABS: 1.0000 | ORAL_TABLET | Freq: Every day | ORAL | Status: DC

## 2014-05-14 MED ORDER — CALCIUM CARBONATE-VITAMIN D 500-200 MG-UNIT PO TABS
1.0000 | ORAL_TABLET | Freq: Every day | ORAL | Status: DC
  Administered 2014-05-14 – 2014-05-17 (×4): 1 via ORAL
  Filled 2014-05-14 (×6): qty 1

## 2014-05-14 MED ORDER — WARFARIN - PHARMACIST DOSING INPATIENT: Freq: Every day | Status: DC

## 2014-05-14 MED ORDER — CALCIUM CARB-CHOLECALCIFEROL 600-800 MG-UNIT PO TABS: 1.0000 | ORAL_TABLET | Freq: Every morning | ORAL | Status: DC

## 2014-05-14 MED ORDER — FUROSEMIDE 40 MG PO TABS
40.0000 mg | ORAL_TABLET | Freq: Two times a day (BID) | ORAL | Status: DC | PRN
  Filled 2014-05-14: qty 2

## 2014-05-14 MED ORDER — OMEGA-3-ACID ETHYL ESTERS 1 G PO CAPS
1.0000 g | ORAL_CAPSULE | Freq: Every day | ORAL | Status: DC
  Administered 2014-05-14 – 2014-05-16 (×3): 1 g via ORAL
  Filled 2014-05-14 (×3): qty 1

## 2014-05-14 MED ORDER — NITROGLYCERIN 0.4 MG SL SUBL
0.4000 mg | SUBLINGUAL_TABLET | SUBLINGUAL | Status: DC | PRN
  Administered 2014-05-14 (×3): 0.4 mg via SUBLINGUAL
  Filled 2014-05-14 (×3): qty 1

## 2014-05-14 MED ORDER — ALUM & MAG HYDROXIDE-SIMETH 200-200-20 MG/5ML PO SUSP: 30.0000 mL | Freq: Four times a day (QID) | ORAL | Status: DC | PRN

## 2014-05-14 MED ORDER — CLOPIDOGREL BISULFATE 75 MG PO TABS
75.0000 mg | ORAL_TABLET | Freq: Every day | ORAL | Status: DC
  Administered 2014-05-14 – 2014-05-17 (×4): 75 mg via ORAL
  Filled 2014-05-14 (×5): qty 1

## 2014-05-14 MED ORDER — OCUVITE-LUTEIN PO CAPS
1.0000 | ORAL_CAPSULE | Freq: Every day | ORAL | Status: DC
  Administered 2014-05-14 – 2014-05-16 (×3): 1 via ORAL
  Filled 2014-05-14 (×3): qty 1

## 2014-05-14 MED ORDER — SODIUM CHLORIDE 0.9 % IJ SOLN
3.0000 mL | Freq: Two times a day (BID) | INTRAMUSCULAR | Status: DC
  Administered 2014-05-15 – 2014-05-16 (×3): 3 mL via INTRAVENOUS

## 2014-05-14 MED ORDER — SPIRONOLACTONE 12.5 MG HALF TABLET
12.5000 mg | ORAL_TABLET | Freq: Every day | ORAL | Status: DC
  Administered 2014-05-14 – 2014-05-16 (×3): 12.5 mg via ORAL
  Filled 2014-05-14 (×6): qty 1

## 2014-05-14 MED ORDER — WARFARIN SODIUM 2 MG PO TABS
2.0000 mg | ORAL_TABLET | ORAL | Status: DC
  Filled 2014-05-14: qty 1

## 2014-05-14 MED ORDER — ATORVASTATIN CALCIUM 80 MG PO TABS
80.0000 mg | ORAL_TABLET | Freq: Every day | ORAL | Status: DC
  Administered 2014-05-14 – 2014-05-17 (×4): 80 mg via ORAL
  Filled 2014-05-14 (×5): qty 1

## 2014-05-14 MED ORDER — SODIUM CHLORIDE 0.9 % IJ SOLN: 3.0000 mL | INTRAMUSCULAR | Status: DC | PRN

## 2014-05-14 MED ORDER — DM-GUAIFENESIN ER 30-600 MG PO TB12
1.0000 | ORAL_TABLET | Freq: Two times a day (BID) | ORAL | Status: DC
  Filled 2014-05-14 (×10): qty 1

## 2014-05-14 MED ORDER — FUROSEMIDE 40 MG PO TABS: 40.0000 mg | ORAL_TABLET | ORAL | Status: DC

## 2014-05-14 MED ORDER — MORPHINE SULFATE 2 MG/ML IJ SOLN
1.0000 mg | INTRAMUSCULAR | Status: DC | PRN
  Administered 2014-05-14 (×3): 1 mg via INTRAVENOUS
  Filled 2014-05-14 (×3): qty 1

## 2014-05-14 MED ORDER — ACETAMINOPHEN 500 MG PO TABS: 500.0000 mg | ORAL_TABLET | Freq: Three times a day (TID) | ORAL | Status: DC | PRN

## 2014-05-14 MED ORDER — SODIUM CHLORIDE 0.9 % IV SOLN
Freq: Once | INTRAVENOUS | Status: AC
  Administered 2014-05-14: 01:00:00 via INTRAVENOUS

## 2014-05-14 MED ORDER — METOPROLOL TARTRATE 25 MG PO TABS
100.0000 mg | ORAL_TABLET | Freq: Two times a day (BID) | ORAL | Status: DC
  Administered 2014-05-14 – 2014-05-17 (×6): 100 mg via ORAL
  Filled 2014-05-14 (×6): qty 1
  Filled 2014-05-14: qty 4
  Filled 2014-05-14: qty 1

## 2014-05-14 MED ORDER — SODIUM CHLORIDE 0.9 % IV SOLN: 250.0000 mL | INTRAVENOUS | Status: DC | PRN

## 2014-05-14 MED ORDER — DILTIAZEM HCL ER COATED BEADS 180 MG PO CP24
180.0000 mg | ORAL_CAPSULE | Freq: Every day | ORAL | Status: DC
  Administered 2014-05-14 – 2014-05-17 (×4): 180 mg via ORAL
  Filled 2014-05-14 (×4): qty 1

## 2014-05-14 MED ORDER — SODIUM CHLORIDE 0.9 % IJ SOLN
3.0000 mL | Freq: Two times a day (BID) | INTRAMUSCULAR | Status: DC
  Administered 2014-05-14 – 2014-05-16 (×5): 3 mL via INTRAVENOUS

## 2014-05-14 MED ORDER — WARFARIN SODIUM 1 MG PO TABS: 1.0000 mg | ORAL_TABLET | ORAL | Status: DC

## 2014-05-14 NOTE — ED Notes (Signed)
Miller, MD at bedside.  

## 2014-05-14 NOTE — Progress Notes (Signed)
Patient reported 7/10 CP, same as she has been having. BP 172/92 HR 95.  Nitro sublingual given, pain down to 3/10. BP 126/78 HR 102. Patient sitting up, appears to be feeling better, in no distress. 1mg  Morphine given, pain 0/10. MD aware of CP, ordered to give imdur early. Requested med from pharmacy.

## 2014-05-14 NOTE — Progress Notes (Signed)
ANTICOAGULATION CONSULT NOTE - Initial Consult  Pharmacy Consult for Coumadin Indication: atrial fibrillation  Allergies  Allergen Reactions  . Bee Venom Anaphylaxis  . Other     Pain medications cause severe vomiting. Tolerated slow IV morphine drip    Patient Measurements: Height: 5' (152.4 cm) Weight: 170 lb (77.111 kg) IBW/kg (Calculated) : 45.5  Vital Signs: Temp: 98.2 F (36.8 C) (04/10 0011) Temp Source: Oral (04/10 0011) BP: 121/70 mmHg (04/10 0215) Pulse Rate: 100 (04/10 0215)  Labs:  Recent Labs  05/14/14 0050 05/14/14 0100  HGB 13.2 13.6  HCT 40.2 40.0  PLT 199  --   LABPROT 29.7*  --   INR 2.80*  --   CREATININE  --  1.10    Estimated Creatinine Clearance: 36.8 mL/min (by C-G formula based on Cr of 1.1).   Medical History: Past Medical History  Diagnosis Date  . Hypertension   . Hyperlipidemia   . Obesity   . Diverticulitis   . Peripheral vascular disease   . Varicose veins   . Granulosa cell carcinoma     abd; last episode was in 2009  . Tachycardia-bradycardia syndrome     a. s/p leadless pacemaker ([name]) implanted by Dr [name]  . Atrial fibrillation 2013  . OSA on CPAP   . Combined systolic and diastolic heart failure, NYHA class 3   . Coronary artery disease     a. s/p multiple stents  b. LHC 12/14/13 with sig CAD with TO of OM1, 70-80% occusion OM2, 50-70% mLCx, 50-70% pLAD (recent FFR 0.83), diffusely diseased RCA, widely patent stetns in pLAD and LCx.  Marland Kitchen LBBB (left bundle branch block)     chronic  . Pacemaker   . Myocardial infarction 2002  . Renal artery stenosis     Medications:  Prescriptions prior to admission  Medication Sig Dispense Refill Last Dose  . acetaminophen (TYLENOL) 500 MG tablet Take 500 mg by mouth every 8 (eight) hours as needed.    05/13/2014 at Unknown time  . atorvastatin (LIPITOR) 80 MG tablet TAKE 1 TABLET (80 MG TOTAL) BY MOUTH EVERY MORNING. 30 tablet 5 05/13/2014 at Unknown time  . Calcium  Carb-Cholecalciferol (CALTRATE 600+D) 600-800 MG-UNIT TABS Take 1 tablet by mouth every morning.    05/13/2014 at Unknown time  . clopidogrel (PLAVIX) 75 MG tablet TAKE 1 TABLET BY MOUTH EVERY DAY WITH BREAKFAST 30 tablet 3 05/13/2014 at Unknown time  . Coenzyme Q-10 100 MG capsule Take 200 mg by mouth daily.    05/13/2014 at Unknown time  . diltiazem (CARDIZEM CD) 180 MG 24 hr capsule Take 1 capsule (180 mg total) by mouth daily. 90 capsule 3 05/13/2014 at Unknown time  . furosemide (LASIX) 40 MG tablet Take two tablets (80mg ) every morning and 1 tablet (40mg ) every evening as needed (Patient taking differently: Take 40-80 mg by mouth See admin instructions. Take two tablets (80mg ) by mouth every morning and 1 tablet (40mg ) by mouth every evening as needed for fluid retention) 90 tablet 1 05/13/2014 at Unknown time  . isosorbide mononitrate (IMDUR) 60 MG 24 hr tablet TAKE 1 TABLET BY MOUTH EVERY MORNING AND 1/2 TABLET EVERY EVENING 45 tablet 6 05/13/2014 at Unknown time  . lisinopril (PRINIVIL,ZESTRIL) 5 MG tablet Take 5 mg by mouth 2 (two) times daily.   05/13/2014 at Unknown time  . metoprolol tartrate (LOPRESSOR) 100 MG tablet Take 1 tablet (100 mg total) by mouth 2 (two) times daily. 60 tablet 11 05/13/2014 at 2200  .  Multiple Vitamin (MULTIVITAMIN WITH MINERALS) TABS tablet Take 1 tablet by mouth every morning.   05/13/2014 at Unknown time  . Multiple Vitamins-Minerals (OCUVITE PRESERVISION) TABS Take 1 tablet by mouth daily.    05/13/2014 at Unknown time  . NITROSTAT 0.4 MG SL tablet PLACE 1 TABLET (0.4 MG TOTAL) UNDER THE TONGUE EVERY 5 (FIVE) MINUTES AS NEEDED. FOR CHEST PAIN 25 tablet 3 05/13/2014 at Unknown time  . Omega-3 Fatty Acids (FISH OIL PO) Take 1,200 mg by mouth daily.    05/13/2014 at Unknown time  . potassium chloride SA (K-DUR,KLOR-CON) 20 MEQ tablet Take 20 mEq by mouth daily.   05/13/2014 at Unknown time  . spironolactone (ALDACTONE) 25 MG tablet Take 12.5 mg by mouth at bedtime.    05/13/2014 at Unknown  time  . warfarin (COUMADIN) 2 MG tablet Take as directed by Coumadin clinic (Patient taking differently: Take 1-2 mg by mouth daily. Take 2mg  daily except 1mg  on Tuesday and Friday) 30 tablet 3 05/13/2014 at Unknown time   Scheduled:  . atorvastatin  80 mg Oral q1800  . Calcium Carb-Cholecalciferol  1 tablet Oral q morning - 10a  . clopidogrel  75 mg Oral Daily  . Coenzyme Q-10  200 mg Oral Daily  . dextromethorphan-guaiFENesin  1 tablet Oral BID  . diltiazem  180 mg Oral Daily  . furosemide  40-80 mg Oral See admin instructions  . isosorbide mononitrate  60 mg Oral Daily  . lisinopril  5 mg Oral BID  . metoprolol tartrate  100 mg Oral BID  . multivitamin with minerals  1 tablet Oral q morning - 10a  . OCUVITE PRESERVISION  1 tablet Oral Daily  . omega-3 acid ethyl esters  1 g Oral Daily  . potassium chloride SA  20 mEq Oral Daily  . sodium chloride  3 mL Intravenous Q12H  . sodium chloride  3 mL Intravenous Q12H  . spironolactone  12.5 mg Oral QHS    Assessment: 81yo female c/o CP relieved w/ NTG x1, recurred later w/ relief again w/ NTG x1, called EMS 2/2 NTG use, to continue Coumadin for Afib during admission for further w/u; current INR therapeutic w/ last Coumadin dose taken 4/9 at home PTA.  Goal of Therapy:  INR 2-3   Plan:  Will continue home Coumadin dose of 2mg  SMWTS and 1mg  TF and monitor INR for dose adjustments.  Wynona Neat, PharmD, BCPS  05/14/2014,3:41 AM

## 2014-05-14 NOTE — Progress Notes (Signed)
ANTICOAGULATION CONSULT NOTE - Follow Up Consult  Pharmacy Consult for heparin Indication: chest pain/ACS  Allergies  Allergen Reactions  . Bee Venom Anaphylaxis  . Other     Pain medications cause severe vomiting. Tolerated slow IV morphine drip    Patient Measurements: Height: 5' (152.4 cm) Weight: 171 lb 4.8 oz (77.701 kg) (Scale C) IBW/kg (Calculated) : 45.5 Heparin Dosing Weight: 63kg  Vital Signs: Temp: 97.6 F (36.4 C) (04/10 1418) Temp Source: Oral (04/10 1418) BP: 111/64 mmHg (04/10 1418) Pulse Rate: 69 (04/10 1418)  Labs:  Recent Labs  05/14/14 0050 05/14/14 0100 05/14/14 0540  HGB 13.2 13.6 12.6  HCT 40.2 40.0 38.5  PLT 199  --  184  LABPROT 29.7*  --   --   INR 2.80*  --   --   CREATININE  --  1.10 1.10  TROPONINI  --   --  0.05*    Estimated Creatinine Clearance: 37 mL/min (by C-G formula based on Cr of 1.1).   Medications:  Scheduled:  . atorvastatin  80 mg Oral q1800  . calcium-vitamin D  1 tablet Oral Q breakfast  . clopidogrel  75 mg Oral Daily  . dextromethorphan-guaiFENesin  1 tablet Oral BID  . diltiazem  180 mg Oral Daily  . isosorbide mononitrate  60 mg Oral Daily  . lisinopril  5 mg Oral BID  . metoprolol tartrate  100 mg Oral BID  . multivitamin with minerals  1 tablet Oral q morning - 10a  . multivitamin-lutein  1 capsule Oral Daily  . omega-3 acid ethyl esters  1 g Oral Daily  . potassium chloride SA  20 mEq Oral Daily  . sodium chloride  3 mL Intravenous Q12H  . sodium chloride  3 mL Intravenous Q12H  . spironolactone  12.5 mg Oral QHS    Assessment: 81 yo female on coumadin for afib and noted with CP. Plans noted for heparin. Spoke with Dr. Dyann Kief and will discontinue coumadin with plans for heparin when INR < 2.0. Patient noted for possible cath on Tuesday.   Goal of Therapy:  Heparin level 0.3-0.7 units/ml Monitor platelets by anticoagulation protocol: Yes   Plan:  -Discontinue coumadin -Daily PT/INR -Will begin  heparin when INR < 2.0  Hildred Laser, Pharm D 05/14/2014 3:30 PM   e

## 2014-05-14 NOTE — Progress Notes (Signed)
PT Cancellation Note  Patient Details Name: Patty [name] MRN: [id number] DOB: 06-26-1930   Cancelled Treatment:    Reason Eval/Treat Not Completed: Patient not medically ready.  Pt continuing to have chest pain this am and received nitro per RN note.  Noted plan for Cardiology consult today.  Will hold PT at this time and f/u as appropriate.     Shatasia Cutshaw, Thornton Papas 05/14/2014, 7:25 AM

## 2014-05-14 NOTE — Progress Notes (Signed)
CPAP setup in room for patient with Auto settings and a Large FFM. Pt is getting settled in room and is not ready for CPAP at this time. RN to notify RT when pt is ready to go on CPAP.

## 2014-05-14 NOTE — Progress Notes (Signed)
Pt had c/o chest pain rated 4/10 1mg  of IV morphine  Pt stated that the pain resolved for a while then stated the pain started up again vital signs are charted in epic WNL O2 2 liters placed. Nitro 0.4 mg given EKG was unchanged from ED will continue to monitor.Arthor Captain LPN

## 2014-05-14 NOTE — Consult Note (Signed)
CARDIOLOGY CONSULT NOTE       Patient ID: Patty [name] MRN: [id number] DOB/AGE: 79-Jul-1932 79 y.o.  Admit date: 05/14/2014 Referring Physician:  Dyann Kief Primary Physician: Dorian Heckle, MD Primary Cardiologist:  Irish Lack Reason for Consultation:  Angina  Principal Problem:   Chest pain Active Problems:   HTN (hypertension)   Hyperlipidemia   Renal artery stenosis   Obesity   Tachycardia-bradycardia syndrome   Combined systolic and diastolic heart failure, NYHA class 3   Pacemaker   Atrial flutter, unspecified   Coronary artery disease due to lipid rich plaque   OSA on CPAP   HPI:   81 y.o. admitted with chest pain suspicious for angina.  Long history of CAD   s/p multiple stents, LBBB, chronic combined systolic/diastolic HF, PVD, HTN, HLD, tachy-brady s/p PPM (leadless), obesity, and OSA on CPAP  She has chronic atrial fib with a CHADSVASC of at least 5 - she is on anticoagulation.   The most recent cardiac catheterization on 12/14/13 demonstrating the following: 1. Significant coronary artery disease with total occlusion of the first obtuse marginal, 70-80% second obtuse marginal stenosis, segmental diffuse 50-70% mid circumflex, 50-70% proximal LAD (recent FFR 0.83), diffusely diseased but non-critical RCA, with widely patent stents in the proximal LAD and circumflex. 2. No change in the angiographic appearance of the coronaries since the September procedure. 3. Left ventricular systolic dysfunction, with EF 40-50%  [facility]ed [facility] on [facility] with chest pain. Rx medically.  She is compliant with her meds  Thursday had angina relieved with one nitro.  Was ok Friday but had 3 more episodes on Saturday and came to hospital.  Needed 3 nitro to relieve one episode.  Currently pain free This am.    ROS All other systems reviewed and negative except as noted above  Past Medical History  Diagnosis Date  . Hypertension   .  Hyperlipidemia   . Obesity   . Diverticulitis   . Peripheral vascular disease   . Varicose veins   . Granulosa cell carcinoma     abd; last episode was in 2009  . Tachycardia-bradycardia syndrome     a. s/p leadless pacemaker ([name]) implanted by Dr [name]  . Atrial fibrillation 2013  . OSA on CPAP   . Combined systolic and diastolic heart failure, NYHA class 3   . Coronary artery disease     a. s/p multiple stents  b. LHC 12/14/13 with sig CAD with TO of OM1, 70-80% occusion OM2, 50-70% mLCx, 50-70% pLAD (recent FFR 0.83), diffusely diseased RCA, widely patent stetns in pLAD and LCx.  Marland Kitchen LBBB (left bundle branch block)     chronic  . Pacemaker   . Myocardial infarction 2002  . Renal artery stenosis     Family History  Problem Relation Age of Onset  . Heart attack Father   . Heart attack Brother   . Stroke Mother   . Diabetes Brother   . Diabetes Father   . Hypertension Brother   . Hypertension Father   . Kidney failure Brother     History   Social History  . Marital Status: Married    Spouse Name: N/A  . Number of Children: N/A  . Years of Education: N/A   Occupational History  . Retired    Social History Main Topics  . Smoking status: Former Smoker -- 1.00 packs/day for 32 years    Types: Cigarettes    Quit date: 02/03/1974  . Smokeless tobacco: Never Used  .  Alcohol Use: 3.0 oz/week    5 Glasses of wine per week     Comment: 11/12/2012 "4oz wine w/dinner 5 days/wk"  . Drug Use: No  . Sexual Activity: No   Other Topics Concern  . Not on file   Social History Narrative   Lives with family.    Past Surgical History  Procedure Laterality Date  . Cholecystectomy  1980's  . Salivary gland surgery  2000's    "had a little lump removed; granulosa related; it was benign" (08/11/2012)  . US echocardiography  11/21/2003    EF 55-60%  . Cardiovascular stress test  03/23/2009    EF 72%  . Abdominal hysterectomy    . Colon surgery  2004    colectomy for  diverticulosis  . Granulosa tumor excision  2000; 2003; 2004; 2007    "all in my abdomen including small intestines, outside my ?uterus/etc" (08/11/2012)  . Diagnostic laparoscopy      gallbladder removal; abdominal hernia repair  . Tee without cardioversion  12/31/2011    Procedure: TRANSESOPHAGEAL ECHOCARDIOGRAM (TEE);  Surgeon: [name], MD;  Location: [place];  Service: Cardiovascular;  Laterality: N/A;  . Cardioversion  12/31/2011    Procedure: CARDIOVERSION;  Surgeon: [name], MD;  Location: [place];  Service: Cardiovascular;  Laterality: N/A;  . Pacemaker insertion  2/11/4    [name] (SJM) leadless pacemaker (LEADLESS II STUDY PATEINT)  . Cardiac catheterization  09/03/2007    EF 70%; Failed attempt at PCI to OM  . Cardiac catheterization  11/01/2003    EF 70%  . Coronary angioplasty    . Coronary angioplasty with stent placement  2000; 08/11/2012; 11/12/2012    3 + 2 LAD & CFX; 2nd CFX stent 11/12/2012  . Insert / replace / remove pacemaker  03/16/2012    [name] (SJM) leadless pacemaker (LEADLESS II STUDY PATEINT)  . Varicose vein surgery Bilateral 1977  . Hernia repair  2005    "laparoscopic"  . Cataract extraction, bilateral  2015  . Cardioversion N/A 12/31/2011    Procedure: CARDIOVERSION;  Surgeon: [name], MD;  Location[facility] C[facility] LAB;  Service: Cardiovascular;  Laterality: N/A;  . Permanent pacemaker insertion N/A 03/16/2012    Procedure: PERMANENT PACEMAKER INSERTION;  Surgeon: Thompson Grayer, MD;  Location: [facility] CATH LAB;  Service: Cardiovascular;  Laterality: N/A;  . Percutaneous coronary intervention-balloon only  08/04/2012    Procedure: PERCUTANEOUS CORONARY INTERVENTION-BALLOON ONLY;  Surgeon: [name], MD;  Location: [facility] CATH LAB;  Service: Cardiovascular;;  . Percutaneous coronary rotoblator intervention (pci-r) N/A 08/11/2012    Procedure: PERCUTANEOUS CORONARY ROTOBLATOR INTERVENTION (PCI-R);  Surgeon: [name], MD;  Location: [facility] CATH LAB;  Service: Cardiovascular;  Laterality: N/A;  . Left heart catheterization with coronary angiogram N/A 11/12/2012    Procedure: LEFT HEART CATHETERIZATION WITH CORONARY ANGIOGRAM;  Surgeon: [name], MD;  Location: [facility] CATH LAB;  Service: Cardiovascular;  Laterality: N/A;  . Left heart catheterization with coronary angiogram N/A 10/07/2013    Procedure: LEFT HEART CATHETERIZATION WITH CORONARY ANGIOGRAM;  Surgeon: [name], MD;  Location: [facility] CATH LAB;  Service: Cardiovascular;  Laterality: N/A;  . Percutaneous coronary stent intervention (pci-s)  10/07/2013    Procedure: PERCUTANEOUS CORONARY STENT INTERVENTION (PCI-S);  Surgeon: [name], MD;  Location: [facility] CATH LAB;  Service: Cardiovascular;;  . Fractional flow reserve wire  10/07/2013    Procedure: FRACTIONAL FLOW RESERVE WIRE;  Surgeon: [name], MD;  Location: [facility]  CATH LAB;  Service: Cardiovascular;;  . Left heart catheterization with coronary angiogram N/A 12/14/2013    Procedure: LEFT HEART CATHETERIZATION WITH CORONARY ANGIOGRAM;  Surgeon: Sinclair Grooms, MD;  Location: [facility] CATH LAB;  Service: Cardiovascular;  Laterality: N/A;     . atorvastatin  80 mg Oral q1800  . calcium-vitamin D  1 tablet Oral Q breakfast  . clopidogrel  75 mg Oral Daily  . dextromethorphan-guaiFENesin  1 tablet Oral BID  . diltiazem  180 mg Oral Daily  . isosorbide mononitrate  60 mg Oral Daily  . lisinopril  5 mg Oral BID  . metoprolol tartrate  100 mg Oral BID  . multivitamin with minerals  1 tablet Oral q morning - 10a  . multivitamin-lutein  1 capsule Oral Daily  . omega-3 acid ethyl esters  1 g Oral Daily  . potassium chloride SA  20 mEq Oral Daily  . sodium chloride  3 mL Intravenous Q12H  . sodium chloride  3 mL Intravenous Q12H  . spironolactone  12.5 mg Oral QHS  . [START ON 05/16/2014] warfarin  1 mg Oral Once per day on Tue Thu  . warfarin  2 mg Oral Once per day on Sun Mon  Wed Thu Sat  . Warfarin - Pharmacist Dosing Inpatient   Does not apply q1800      Physical Exam: Blood pressure 126/78, pulse 102, temperature 98.4 F (36.9 C), temperature source Oral, resp. rate 18, height 5' (1.524 m), weight 171 lb 4.8 oz (77.701 kg), SpO2 93 %.   Affect appropriate Overweight white female  HEENT: normal Neck supple with no adenopathy JVP normal no bruits no thyromegaly Lungs clear with no wheezing and good diaphragmatic motion Heart:  S1/S2 SEM  murmur, no rub, gallop or click PMI normal Abdomen: benighn, BS positve, no tenderness, no AAA no bruit.  No HSM or HJR Distal pulses intact with no bruits No edema Neuro non-focal Skin warm and dry No muscular weakness   Labs:   Lab Results  Component Value Date   WBC 5.9 05/14/2014   HGB 12.6 05/14/2014   HCT 38.5 05/14/2014   MCV 95.1 05/14/2014   PLT 184 05/14/2014    Recent Labs Lab 05/14/14 0540  NA 144  K 3.9  CL 109  CO2 28  BUN 38*  CREATININE 1.10  CALCIUM 8.6  PROT 6.1  BILITOT 0.5  ALKPHOS 72  ALT 18  AST 26  GLUCOSE 105*   Lab Results  Component Value Date   CKTOTAL 70 06/08/2010   CKMB 2.6 06/08/2010   TROPONINI 0.05* 05/14/2014    Lab Results  Component Value Date   CHOL 125 01/06/2014   CHOL 131 10/05/2013   CHOL 134 10/03/2010   Lab Results  Component Value Date   HDL 64 01/06/2014   HDL 61 10/05/2013   HDL 62.80 10/03/2010   Lab Results  Component Value Date   LDLCALC 47 01/06/2014   LDLCALC 51 10/05/2013   LDLCALC 58 10/03/2010   Lab Results  Component Value Date   TRIG 70 01/06/2014   TRIG 93 10/05/2013   TRIG 66.0 10/03/2010   Lab Results  Component Value Date   CHOLHDL 2.0 01/06/2014   CHOLHDL 2.1 10/05/2013   CHOLHDL 2 10/03/2010   No results found for: LDLDIRECT    Radiology: Dg Chest Port 1 View  05/14/2014   CLINICAL DATA:  Chest pain.  EXAM: PORTABLE CHEST - 1 VIEW  COMPARISON:  One-view chest x-ray  01/05/2014  FINDINGS: The heart is  mildly enlarged. Aeration is improved. Mild bibasilar atelectasis is evident. There is no significant airspace consolidation. Atherosclerotic changes are noted at the arch. Mild degenerative changes are present in the [facility] joints bilaterally.  IMPRESSION: 1. Cardiomegaly without failure. 2. Mild bibasilar airspace disease likely reflects atelectasis. 3. Improved aeration since the prior exam.   Electronically Signed   By: San Morelle M.D.   On: 05/14/2014 01:33    EKG:  afib rate 98 LBBB   ASSESSMENT AND PLAN:  Chest Pain:  Known CAD Already on good medical RX with plavix, nitrates and beta blocker  Cath 11/15 with patent stents in LAD/Circ but multiple other lesions in OM, diffuse RCA and borderline LAD with FFR .8.  Angina relieved with nitro.  Will need cath possibly Tuesday depending on how long it takes for INR to come down Start heparin when INR less than 2.0  Use IV nitro and transfer to unit for more pain.    Afib:  Continue beta blocker for rate control  Hold coumadin for cath  Only on plavix for CAD due to anticoagulation  LBBB:  Old , chronic no high grade AV block   Chol:  On statin   Signed: Jenkins Rouge 05/14/2014, 10:44 AM

## 2014-05-14 NOTE — H&P (Addendum)
[facility] History and Physical  Patty [name] QBV:[id number] DOB: 1931-01-06 DOA: 05/14/2014  Referring physician: ED physician PCP: Dorian Heckle, MD  Specialists:   Chief Complaint: chest pain  HPI: Patty [name] is a 81 y.o. female with hx CAD s/p multiple stents, HTN, HLD, Atrial fibrillation, PVD, history of granulosa cancer 20069, atrial fibrillation on Coumadin, pacemaker placement, OSA on CPAP, combined systolic and diastolic congestive heart failure, chronic left bundle blockage, who presents with chest pain.  Patent reports that she had one episode of chest pain at about 10 PM. The chest pain is pressure-like, nonradiating, located in the substernal area. It lasted for about 5 minutes, and resolved after took one dose of nitroglycerin. One hour later, she had another episode of similar chest pain, which lasted for about 10 minutes. It was associated with lightheadedness and palpitation. No shortness of breath. Currently patient does not have chest pain. She has mild chronic cough, which has not changed.  Patient denies fever, chills, fatigue, headaches, cough, SOB, abdominal pain, diarrhea, constipation, dysuria, urgency, frequency, hematuria, skin rashes or leg swelling. No unilateral weakness, numbness or tingling sensations. No vision change or hearing loss.   In ED, patient was found to have negative troponin, INR 2.8, temperature normal, heart rate 101, chest x-ray negative for acute abnormalities. EKG showed old left bundle blockage and QT prolongation 555. Patient is admitted to inpatient for further evaluation and treatment. Cardiology was consulted by ED, will see pt in morning.  Review of Systems: As presented in the history of presenting illness, rest negative.  Where does patient live?  At home Can patient participate in ADLs? some  Allergy:  Allergies  Allergen Reactions  . Bee Venom Anaphylaxis  . Other     Pain medications cause severe vomiting.  Tolerated slow IV morphine drip    Past Medical History  Diagnosis Date  . Hypertension   . Hyperlipidemia   . Obesity   . Diverticulitis   . Peripheral vascular disease   . Varicose veins   . Granulosa cell carcinoma     abd; last episode was in 2009  . Tachycardia-bradycardia syndrome     a. s/p leadless pacemaker ([name]) implanted by Dr [name]  . Atrial fibrillation 2013  . OSA on CPAP   . Combined systolic and diastolic heart failure, NYHA class 3   . Coronary artery disease     a. s/p multiple stents  b. LHC 12/14/13 with sig CAD with TO of OM1, 70-80% occusion OM2, 50-70% mLCx, 50-70% pLAD (recent FFR 0.83), diffusely diseased RCA, widely patent stetns in pLAD and LCx.  Marland Kitchen LBBB (left bundle branch block)     chronic  . Pacemaker   . Myocardial infarction 2002  . Renal artery stenosis     Past Surgical History  Procedure Laterality Date  . Cholecystectomy  1980's  . Salivary gland surgery  2000's    "had a little lump removed; granulosa related; it was benign" (08/11/2012)  . US echocardiography  11/21/2003    EF 55-60%  . Cardiovascular stress test  03/23/2009    EF 72%  . Abdominal hysterectomy    . Colon surgery  2004    colectomy for diverticulosis  . Granulosa tumor excision  2000; 2003; 2004; 2007    "all in my abdomen including small intestines, outside my ?uterus/etc" (08/11/2012)  . Diagnostic laparoscopy      gallbladder removal; abdominal hernia repair  . Tee without cardioversion  12/31/2011  Procedure: TRANSESOPHAGEAL ECHOCARDIOGRAM (TEE);  Surgeon: [name], MD;  Location: [place];  Service: Cardiovascular;  Laterality: N/A;  . Cardioversion  12/31/2011    Procedure: CARDIOVERSION;  Surgeon: [name], MD;  Location: [place];  Service: Cardiovascular;  Laterality: N/A;  . Pacemaker insertion  2/11/4    [name] (SJM) leadless pacemaker (LEADLESS II STUDY PATEINT)  . Cardiac catheterization  09/03/2007    EF 70%;  Failed attempt at PCI to OM  . Cardiac catheterization  11/01/2003    EF 70%  . Coronary angioplasty    . Coronary angioplasty with stent placement  2000; 08/11/2012; 11/12/2012    3 + 2 LAD & CFX; 2nd CFX stent 11/12/2012  . Insert / replace / remove pacemaker  03/16/2012    [name] (SJM) leadless pacemaker (LEADLESS II STUDY PATEINT)  . Varicose vein surgery Bilateral 1977  . Hernia repair  2005    "laparoscopic"  . Cataract extraction, bilateral  2015  . Cardioversion N/A 12/31/2011    Procedure: CARDIOVERSION;  Surgeon: [name], MD;  Location: [facility] CATH LAB;  Service: Cardiovascular;  Laterality: N/A;  . Permanent pacemaker insertion N/A 03/16/2012    Procedure: PERMANENT PACEMAKER INSERTION;  Surgeon: Thompson Grayer, MD;  Location: [facility] CATH LAB;  Service: Cardiovascular;  Laterality: N/A;  . Percutaneous coronary intervention-balloon only  08/04/2012    Procedure: PERCUTANEOUS CORONARY INTERVENTION-BALLOON ONLY;  Surgeon: [name], MD;  Location: [facility] CATH LAB;  Service: Cardiovascular;;  . Percutaneous coronary rotoblator intervention (pci-r) N/A 08/11/2012    Procedure: PERCUTANEOUS CORONARY ROTOBLATOR INTERVENTION (PCI-R);  Surgeon: [name], MD;  Location[facility] C[facility] LAB;  Service: Cardiovascular;  Laterality: N/A;  . Left heart catheterization with coronary angiogram N/A 11/12/2012    Procedure: LEFT HEART CATHETERIZATION WITH CORONARY ANGIOGRAM;  Surgeon: [name], MD;  Location: [facility] CATH LAB;  Service: Cardiovascular;  Laterality: N/A;  . Left heart catheterization with coronary angiogram N/A 10/07/2013    Procedure: LEFT HEART CATHETERIZATION WITH CORONARY ANGIOGRAM;  Surgeon: [name], MD;  Location: [facility] CATH LAB;  Service: Cardiovascular;  Laterality: N/A;  . Percutaneous coronary stent intervention (pci-s)  10/07/2013    Procedure: PERCUTANEOUS CORONARY STENT INTERVENTION (PCI-S);  Surgeon: [name], MD;  Location: [facility] CATH LAB;   Service: Cardiovascular;;  . Fractional flow reserve wire  10/07/2013    Procedure: FRACTIONAL FLOW RESERVE WIRE;  Surgeon: [name], MD;  Location: [facility] CATH LAB;  Service: Cardiovascular;;  . Left heart catheterization with coronary angiogram N/A 12/14/2013    Procedure: LEFT HEART CATHETERIZATION WITH CORONARY ANGIOGRAM;  Surgeon: Sinclair Grooms, MD;  Location: [facility] CATH LAB;  Service: Cardiovascular;  Laterality: N/A;    Social History:  reports that she quit smoking about 40 years ago. Her smoking use included Cigarettes. She has a 32 pack-year smoking history. She has never used smokeless tobacco. She reports that she drinks about 3.0 oz of alcohol per week. She reports that she does not use illicit drugs.  Family History:  Family History  Problem Relation Age of Onset  . Heart attack Father   . Heart attack Brother   . Stroke Mother   . Diabetes Brother   . Diabetes Father   . Hypertension Brother   . Hypertension Father   . Kidney failure Brother      Prior to Admission medications   Medication Sig Start Date End Date Taking? Authorizing Provider  acetaminophen (TYLENOL) 500 MG tablet Take 500  mg by mouth every 8 (eight) hours as needed.    Yes Historical Provider, MD  atorvastatin (LIPITOR) 80 MG tablet TAKE 1 TABLET (80 MG TOTAL) BY MOUTH EVERY MORNING. 12/20/13  Yes [name], MD  Calcium Carb-Cholecalciferol (CALTRATE 600+D) 600-800 MG-UNIT TABS Take 1 tablet by mouth every morning.    Yes Historical Provider, MD  clopidogrel (PLAVIX) 75 MG tablet TAKE 1 TABLET BY MOUTH EVERY DAY WITH BREAKFAST 12/20/13  Yes [name], MD  Coenzyme Q-10 100 MG capsule Take 200 mg by mouth daily.    Yes Historical Provider, MD  diltiazem (CARDIZEM CD) 180 MG 24 hr capsule Take 1 capsule (180 mg total) by mouth daily. 04/10/14  Yes Thompson Grayer, MD  furosemide (LASIX) 40 MG tablet Take two tablets (80mg ) every morning and 1 tablet (40mg ) every evening as  needed Patient taking differently: Take 40-80 mg by mouth See admin instructions. Take two tablets (80mg ) by mouth every morning and 1 tablet (40mg ) by mouth every evening as needed for fluid retention 03/23/14  Yes [name], MD  isosorbide mononitrate (IMDUR) 60 MG 24 hr tablet TAKE 1 TABLET BY MOUTH EVERY MORNING AND 1/2 TABLET EVERY EVENING 05/02/14  Yes [name], MD  lisinopril (PRINIVIL,ZESTRIL) 5 MG tablet Take 5 mg by mouth 2 (two) times daily.   Yes Historical Provider, MD  metoprolol tartrate (LOPRESSOR) 100 MG tablet Take 1 tablet (100 mg total) by mouth 2 (two) times daily. 12/15/13  Yes Eileen Stanford, PA-C  Multiple Vitamin (MULTIVITAMIN WITH MINERALS) TABS tablet Take 1 tablet by mouth every morning.   Yes Historical Provider, MD  Multiple Vitamins-Minerals (OCUVITE PRESERVISION) TABS Take 1 tablet by mouth daily.    Yes Historical Provider, MD  NITROSTAT 0.4 MG SL tablet PLACE 1 TABLET (0.4 MG TOTAL) UNDER THE TONGUE EVERY 5 (FIVE) MINUTES AS NEEDED. FOR CHEST PAIN 02/01/14  Yes [name], MD  Omega-3 Fatty Acids (FISH OIL PO) Take 1,200 mg by mouth daily.    Yes Historical Provider, MD  potassium chloride SA (K-DUR,KLOR-CON) 20 MEQ tablet Take 20 mEq by mouth daily.   Yes Historical Provider, MD  spironolactone (ALDACTONE) 25 MG tablet Take 12.5 mg by mouth at bedtime.    Yes Historical Provider, MD  warfarin (COUMADIN) 2 MG tablet Take as directed by Coumadin clinic Patient taking differently: Take 1-2 mg by mouth daily. Take 2mg  daily except 1mg  on Tuesday and Friday 10/14/13  Yes [name], MD    Physical Exam: Filed Vitals:   05/14/14 0004 05/14/14 0011 05/14/14 0100 05/14/14 0215  BP:  124/70 124/91 121/70  Pulse:  101 90 100  Temp:  98.2 F (36.8 C)    TempSrc:  Oral    Resp:  18 30 21   Height:  5' (1.524 m)    Weight:  77.111 kg (170 lb)    SpO2: 100% 98% 96% 94%   General: Not in acute distress HEENT:       Eyes: PERRL,  EOMI, no scleral icterus       ENT: No discharge from the ears and nose, no pharynx injection, no tonsillar enlargement.        Neck: No JVD, no bruit, no mass felt. Cardiac: S1/S2, irregularly irregular rhythm, No murmurs, No gallops or rubs Pulm: Good air movement bilaterally. Clear to auscultation bilaterally. No rales, wheezing, rhonchi or rubs. Abd: Soft, nondistended, nontender, no rebound pain, no organomegaly, BS present Ext: No edema bilaterally. 2+DP/PT  pulse bilaterally Musculoskeletal: No joint deformities, erythema, or stiffness, ROM full Skin: No rashes.  Neuro: Alert and oriented X3, cranial nerves II-XII grossly intact, muscle strength 5/5 in all extremeties, sensation to light touch intact.  Psych: Patient is not psychotic, no suicidal or hemocidal ideation.  Labs on Admission:  Basic Metabolic Panel:  Recent Labs Lab 05/14/14 0100  NA 144  K 3.7  CL 105  GLUCOSE 127*  BUN 43*  CREATININE 1.10   Liver Function Tests: No results for input(s): AST, ALT, ALKPHOS, BILITOT, PROT, ALBUMIN in the last 168 hours. No results for input(s): LIPASE, AMYLASE in the last 168 hours. No results for input(s): AMMONIA in the last 168 hours. CBC:  Recent Labs Lab 05/14/14 0050 05/14/14 0100  WBC 6.6  --   NEUTROABS 4.3  --   HGB 13.2 13.6  HCT 40.2 40.0  MCV 94.8  --   PLT 199  --    Cardiac Enzymes: No results for input(s): CKTOTAL, CKMB, CKMBINDEX, TROPONINI in the last 168 hours.  BNP (last 3 results) No results for input(s): BNP in the last 8760 hours.  ProBNP (last 3 results)  Recent Labs  10/04/13 1221 12/11/13 1101 01/05/14 1027  PROBNP 1963.0* 1383.0* 1341.0*    CBG: No results for input(s): GLUCAP in the last 168 hours.  Radiological Exams on Admission: Dg Chest Port 1 View  05/14/2014   CLINICAL DATA:  Chest pain.  EXAM: PORTABLE CHEST - 1 VIEW  COMPARISON:  One-view chest x-ray 01/05/2014  FINDINGS: The heart is mildly enlarged. Aeration is  improved. Mild bibasilar atelectasis is evident. There is no significant airspace consolidation. Atherosclerotic changes are noted at the arch. Mild degenerative changes are present in the [facility] joints bilaterally.  IMPRESSION: 1. Cardiomegaly without failure. 2. Mild bibasilar airspace disease likely reflects atelectasis. 3. Improved aeration since the prior exam.   Electronically Signed   By: San Morelle M.D.   On: 05/14/2014 01:33    EKG: Independently reviewed. EKG showed old left bundle blockage and QT prolongation 555.  Assessment/Plan Principal Problem:   Chest pain Active Problems:   HTN (hypertension)   Hyperlipidemia   Renal artery stenosis   Obesity   Tachycardia-bradycardia syndrome   Combined systolic and diastolic heart failure, NYHA class 3   Pacemaker   Atrial flutter, unspecified   Coronary artery disease due to lipid rich plaque   OSA on CPAP  Chest pain: Patient has significant hx of coronary artery disease with multiple stent placement, it is important to rule out ACS. Currently patient does not have chest pain. Chest x-ray is negative for infiltration. PE is very unlikely given that patient is taking Coumadin with therapeutic INR.  - will admit to Tele bed  - cycle CE q6 x3 and repeat her EKG in the am  - Nitroglycerin, Morphine, and plavix, lipitor, metoprolol  - follow up cardiology recommendations - 2d echo  Atrial Fibrillation: CHA2DS2-VASc Score is 6 , needs oral anticoagulation. Patient is on Coumadin at home. INR is 2.8 on admission. Heart rate is well controlled. -Continue metoprolol and Cardizem -Continue Coumadin per pharmacy  Combined systolic and diastolic congestive heart failure: 2-D echo on 12/31/11 showed EF 50-55%. Patient is on Lasix and spironolactone at home. It is compensated on admission. The patient does not have leg edema. -Continue home regimen of diuretics (patient is on lasix bid PRN) -Check BNP, if elevated, need to give Lasix    Tachycardia-bradycardia syndrome: -Pacemaker   OSA: -CPAP  Hyperlipidemia: LDL was a 47 on 01/06/14. -Continue Lipitor  Hypertension: -On lisinopril, metoprolol, Lasix, Cardizem, spironolactone    DVT ppx: SCD and on Coumadin with INR 2.8 Code Status: DNR Family Communication: None at bed side.    Disposition Plan: Admit to inpatient   Date of Service 05/14/2014    Ivor Costa [facility] Pager [phone]  If 7PM-7AM, please contact night-coverage [email or web address] Password [email or web address] 05/14/2014, 3:22 AM

## 2014-05-14 NOTE — Progress Notes (Signed)
  Echocardiogram 2D Echocardiogram has been performed.  Diamond Nickel 05/14/2014, 3:48 PM

## 2014-05-14 NOTE — ED Provider Notes (Signed)
CSN: [id number]     Arrival date & time 05/14/14  0004 History  This chart was scribed for Patty Chapel, MD by Eustaquio Maize, ED Scribe. This patient was seen in room D35C/D35C and the patient's care was started at 12:13 AM.     Chief Complaint  Patient presents with  . Chest Pain   The history is provided by the patient. No language interpreter was used.     HPI Comments: Patty [name] is a 81 y.o. female with hx CAD s/p multiple stents, HTN, HLD, Atrial fibrillation, and sleep apnea who presents to the Emergency Department complaining of an angina attack that occurred around 10 PM tonight. Pt describes the pain as a heaviness on her chest. Pt also complains of palpitations and lightheadedness.  She took a NTG which alleviated her symptoms. She then got ready for bed and put on her CPAP machine. Pt woke up shortly after with the same symptoms, causing her to come to the ED. She does mention having similar symptoms on Thursday, 4/7 (3 days ago). Pt also had pain radiation to her jaw at that time. She took a NTG which relieved her symptoms. Pt does report having similar symptoms in the past but has not had these symptoms for the past 3 months until Thursday night. Pt is currently undergoing cardiac rehab and was there 2 days ago. She denies shortness of breath, pain radiation, nausea, vomiting, or any other symptoms. Pt is currently on Coumadin.       Past Medical History  Diagnosis Date  . Hypertension   . Hyperlipidemia   . Obesity   . Diverticulitis   . Peripheral vascular disease   . Varicose veins   . Granulosa cell carcinoma     abd; last episode was in 2009  . Tachycardia-bradycardia syndrome     a. s/p leadless pacemaker ([name]) implanted by Dr [name]  . Atrial fibrillation 2013  . OSA on CPAP   . Combined systolic and diastolic heart failure, NYHA class 3   . Coronary artery disease     a. s/p multiple stents  b. LHC 12/14/13 with sig CAD with TO of OM1, 70-80% occusion  OM2, 50-70% mLCx, 50-70% pLAD (recent FFR 0.83), diffusely diseased RCA, widely patent stetns in pLAD and LCx.  Marland Kitchen LBBB (left bundle branch block)     chronic  . Pacemaker   . Myocardial infarction 2002  . Renal artery stenosis    Past Surgical History  Procedure Laterality Date  . Cholecystectomy  1980's  . Salivary gland surgery  2000's    "had a little lump removed; granulosa related; it was benign" (08/11/2012)  . US echocardiography  11/21/2003    EF 55-60%  . Cardiovascular stress test  03/23/2009    EF 72%  . Abdominal hysterectomy    . Colon surgery  2004    colectomy for diverticulosis  . Granulosa tumor excision  2000; 2003; 2004; 2007    "all in my abdomen including small intestines, outside my ?uterus/etc" (08/11/2012)  . Diagnostic laparoscopy      gallbladder removal; abdominal hernia repair  . Tee without cardioversion  12/31/2011    Procedure: TRANSESOPHAGEAL ECHOCARDIOGRAM (TEE);  Surgeon: [name], MD;  Location: [place];  Service: Cardiovascular;  Laterality: N/A;  . Cardioversion  12/31/2011    Procedure: CARDIOVERSION;  Surgeon: [name], MD;  Location: [place];  Service: Cardiovascular;  Laterality: N/A;  . Pacemaker insertion  2/11/4    [name] (  SJM) leadless pacemaker (LEADLESS II STUDY PATEINT)  . Cardiac catheterization  09/03/2007    EF 70%; Failed attempt at PCI to OM  . Cardiac catheterization  11/01/2003    EF 70%  . Coronary angioplasty    . Coronary angioplasty with stent placement  2000; 08/11/2012; 11/12/2012    3 + 2 LAD & CFX; 2nd CFX stent 11/12/2012  . Insert / replace / remove pacemaker  03/16/2012    [name] (SJM) leadless pacemaker (LEADLESS II STUDY PATEINT)  . Varicose vein surgery Bilateral 1977  . Hernia repair  2005    "laparoscopic"  . Cataract extraction, bilateral  2015  . Cardioversion N/A 12/31/2011    Procedure: CARDIOVERSION;  Surgeon: [name], MD;  Location: [facility] CATH LAB;  Service:  Cardiovascular;  Laterality: N/A;  . Permanent pacemaker insertion N/A 03/16/2012    Procedure: PERMANENT PACEMAKER INSERTION;  Surgeon: Thompson Grayer, MD;  Location: [facility] CATH LAB;  Service: Cardiovascular;  Laterality: N/A;  . Percutaneous coronary intervention-balloon only  08/04/2012    Procedure: PERCUTANEOUS CORONARY INTERVENTION-BALLOON ONLY;  Surgeon: [name], MD;  Location: [facility] CATH LAB;  Service: Cardiovascular;;  . Percutaneous coronary rotoblator intervention (pci-r) N/A 08/11/2012    Procedure: PERCUTANEOUS CORONARY ROTOBLATOR INTERVENTION (PCI-R);  Surgeon: [name], MD;  Location: [facility] CATH LAB;  Service: Cardiovascular;  Laterality: N/A;  . Left heart catheterization with coronary angiogram N/A 11/12/2012    Procedure: LEFT HEART CATHETERIZATION WITH CORONARY ANGIOGRAM;  Surgeon: [name], MD;  Location: [facility] CATH LAB;  Service: Cardiovascular;  Laterality: N/A;  . Left heart catheterization with coronary angiogram N/A 10/07/2013    Procedure: LEFT HEART CATHETERIZATION WITH CORONARY ANGIOGRAM;  Surgeon: [name], MD;  Location: [facility] CATH LAB;  Service: Cardiovascular;  Laterality: N/A;  . Percutaneous coronary stent intervention (pci-s)  10/07/2013    Procedure: PERCUTANEOUS CORONARY STENT INTERVENTION (PCI-S);  Surgeon: [name], MD;  Location: [facility] CATH LAB;  Service: Cardiovascular;;  . Fractional flow reserve wire  10/07/2013    Procedure: FRACTIONAL FLOW RESERVE WIRE;  Surgeon: [name], MD;  Location: [facility] CATH LAB;  Service: Cardiovascular;;  . Left heart catheterization with coronary angiogram N/A 12/14/2013    Procedure: LEFT HEART CATHETERIZATION WITH CORONARY ANGIOGRAM;  Surgeon: Sinclair Grooms, MD;  Location: [facility] CATH LAB;  Service: Cardiovascular;  Laterality: N/A;   Family History  Problem Relation Age of Onset  . Heart attack Father   . Heart attack Brother   . Stroke Mother   . Diabetes Brother   . Diabetes Father    . Hypertension Brother   . Hypertension Father   . Kidney failure Brother    History  Substance Use Topics  . Smoking status: Former Smoker -- 1.00 packs/day for 32 years    Types: Cigarettes    Quit date: 02/03/1974  . Smokeless tobacco: Never Used  . Alcohol Use: 3.0 oz/week    5 Glasses of wine per week     Comment: 11/12/2012 "4oz wine w/dinner 5 days/wk"   OB History    No data available     Review of Systems  All other systems reviewed and are negative.     Allergies  Bee venom and Other  Home Medications   Prior to Admission medications   Medication Sig Start Date End Date Taking? Authorizing Provider  acetaminophen (TYLENOL) 500 MG tablet Take 500 mg by mouth every 8 (eight) hours as needed.    Yes Historical  Provider, MD  atorvastatin (LIPITOR) 80 MG tablet TAKE 1 TABLET (80 MG TOTAL) BY MOUTH EVERY MORNING. 12/20/13  Yes [name], MD  Calcium Carb-Cholecalciferol (CALTRATE 600+D) 600-800 MG-UNIT TABS Take 1 tablet by mouth every morning.    Yes Historical Provider, MD  clopidogrel (PLAVIX) 75 MG tablet TAKE 1 TABLET BY MOUTH EVERY DAY WITH BREAKFAST 12/20/13  Yes [name], MD  Coenzyme Q-10 100 MG capsule Take 200 mg by mouth daily.    Yes Historical Provider, MD  diltiazem (CARDIZEM CD) 180 MG 24 hr capsule Take 1 capsule (180 mg total) by mouth daily. 04/10/14  Yes Thompson Grayer, MD  furosemide (LASIX) 40 MG tablet Take two tablets (80mg ) every morning and 1 tablet (40mg ) every evening as needed Patient taking differently: Take 40-80 mg by mouth See admin instructions. Take two tablets (80mg ) by mouth every morning and 1 tablet (40mg ) by mouth every evening as needed for fluid retention 03/23/14  Yes [name], MD  isosorbide mononitrate (IMDUR) 60 MG 24 hr tablet TAKE 1 TABLET BY MOUTH EVERY MORNING AND 1/2 TABLET EVERY EVENING 05/02/14  Yes [name], MD  lisinopril (PRINIVIL,ZESTRIL) 5 MG tablet Take 5 mg by mouth 2 (two)  times daily.   Yes Historical Provider, MD  metoprolol tartrate (LOPRESSOR) 100 MG tablet Take 1 tablet (100 mg total) by mouth 2 (two) times daily. 12/15/13  Yes Eileen Stanford, PA-C  Multiple Vitamin (MULTIVITAMIN WITH MINERALS) TABS tablet Take 1 tablet by mouth every morning.   Yes Historical Provider, MD  Multiple Vitamins-Minerals (OCUVITE PRESERVISION) TABS Take 1 tablet by mouth daily.    Yes Historical Provider, MD  NITROSTAT 0.4 MG SL tablet PLACE 1 TABLET (0.4 MG TOTAL) UNDER THE TONGUE EVERY 5 (FIVE) MINUTES AS NEEDED. FOR CHEST PAIN 02/01/14  Yes [name], MD  Omega-3 Fatty Acids (FISH OIL PO) Take 1,200 mg by mouth daily.    Yes Historical Provider, MD  potassium chloride SA (K-DUR,KLOR-CON) 20 MEQ tablet Take 20 mEq by mouth daily.   Yes Historical Provider, MD  spironolactone (ALDACTONE) 25 MG tablet Take 12.5 mg by mouth at bedtime.    Yes Historical Provider, MD  warfarin (COUMADIN) 2 MG tablet Take as directed by Coumadin clinic Patient taking differently: Take 1-2 mg by mouth daily. Take 2mg  daily except 1mg  on Tuesday and Friday 10/14/13  Yes [name], MD   Triage Vitals: BP 124/70 mmHg  Pulse 101  Temp(Src) 98.2 F (36.8 C) (Oral)  Resp 18  Ht 5' (1.524 m)  Wt 170 lb (77.111 kg)  BMI 33.20 kg/m2  SpO2 98%   Physical Exam  Constitutional: She appears well-developed and well-nourished. No distress.  HENT:  Head: Normocephalic and atraumatic.  Mouth/Throat: Oropharynx is clear and moist. No oropharyngeal exudate.  Eyes: Conjunctivae and EOM are normal. Pupils are equal, round, and reactive to light. Right eye exhibits no discharge. Left eye exhibits no discharge. No scleral icterus.  Neck: Normal range of motion. Neck supple. No JVD present. No thyromegaly present.  Cardiovascular: Normal rate, regular rhythm, normal heart sounds and intact distal pulses.  Exam reveals no gallop and no friction rub.   No murmur heard. Frequent ectopy.    Pulmonary/Chest: Effort normal and breath sounds normal. No respiratory distress. She has no wheezes. She has no rales.  Abdominal: Soft. Bowel sounds are normal. She exhibits no distension and no mass. There is no tenderness.  Musculoskeletal: Normal range of motion. She  exhibits no edema or tenderness.  Lymphadenopathy:    She has no cervical adenopathy.  Neurological: She is alert. Coordination normal.  Skin: Skin is warm and dry. No rash noted. No erythema.  Psychiatric: She has a normal mood and affect. Her behavior is normal.  Nursing note and vitals reviewed.   ED Course  Procedures (including critical care time)  DIAGNOSTIC STUDIES: Oxygen Saturation is 98% on RA, normal by my interpretation.    COORDINATION OF CARE: 12:22 AM-Discussed treatment plan with pt at bedside and pt agreed to plan.   Labs Review Labs Reviewed  PROTIME-INR - Abnormal; Notable for the following:    Prothrombin Time 29.7 (*)    INR 2.80 (*)    All other components within normal limits  TROPONIN I - Abnormal; Notable for the following:    Troponin I 0.05 (*)    All other components within normal limits  BRAIN NATRIURETIC PEPTIDE - Abnormal; Notable for the following:    B Natriuretic Peptide 386.6 (*)    All other components within normal limits  COMPREHENSIVE METABOLIC PANEL - Abnormal; Notable for the following:    Glucose, Bld 105 (*)    BUN 38 (*)    Albumin 3.0 (*)    GFR calc non Af Amer 46 (*)    GFR calc Af Amer 53 (*)    All other components within normal limits  GLUCOSE, CAPILLARY - Abnormal; Notable for the following:    Glucose-Capillary 102 (*)    All other components within normal limits  I-STAT CHEM 8, ED - Abnormal; Notable for the following:    BUN 43 (*)    Glucose, Bld 127 (*)    All other components within normal limits  CBC WITH DIFFERENTIAL/PLATELET  CBC  TROPONIN I  TROPONIN I  Randolm Idol, ED    Imaging Review Dg Chest Port 1 View  05/14/2014    CLINICAL DATA:  Chest pain.  EXAM: PORTABLE CHEST - 1 VIEW  COMPARISON:  One-view chest x-ray 01/05/2014  FINDINGS: The heart is mildly enlarged. Aeration is improved. Mild bibasilar atelectasis is evident. There is no significant airspace consolidation. Atherosclerotic changes are noted at the arch. Mild degenerative changes are present in the [facility] joints bilaterally.  IMPRESSION: 1. Cardiomegaly without failure. 2. Mild bibasilar airspace disease likely reflects atelectasis. 3. Improved aeration since the prior exam.   Electronically Signed   By: San Morelle M.D.   On: 05/14/2014 01:33     EKG Interpretation   Date/Time:  Sunday May 14 2014 00:08:35 EDT Ventricular Rate:  101 PR Interval:  125 QRS Duration: 166 QT Interval:  428 QTC Calculation: 555 R Axis:   -94 Text Interpretation:  Right and left arm electrode reversal,  interpretation assumes no reversal Sinus tachycardia Nonspecific IVCD with  LAD Probable anterolateral infarct, acute Since last tracing pacing not  seen, similar to rhythm from 2014 Confirmed by Draper Gallon  MD, [place] ([id number])  on 05/14/2014 12:32:06 AM      MDM   Final diagnoses:  Chest pain   The patient has ongoing intermittent chest pain, it is more frequent than usual, this stuttering escalating type chest pain is concerning for acute coronary syndrome, discussed with cardiology Dr. Claiborne Billings, discussed with hospitalist, she will be admitted to the hospital for further evaluation and ongoing testing.   I personally performed the services described in this documentation, which was scribed in my presence. The recorded information has been reviewed and is accurate.  Patty Chapel, MD 05/14/14 [phone]

## 2014-05-14 NOTE — Progress Notes (Signed)
[facility]. Informed of treatment of 1mg  of IV morphine and 2 nitro given pt [laced on 2l O2 no change on EKG

## 2014-05-14 NOTE — Progress Notes (Signed)
Patient seen and examined. Admitted after midnight secondary to CP. Patient with known CAD and multiple stents. Presentation typical and relieved by NTG. Patient has required a total of 3 doses of NTG before achieving chest pain free state this morning. Case discussed with cardiology and plan is for cath most likely on 05/16/14; they also recommend if she experience any further CP, to start IV NTG and transfer her to CCU.  Please referred to Dr. Ivor Costa H&P for further details/info on admission.  Plan: -will hold coumadin and let it drift down for cath on Tuesday; INR 2.8 -if further pain will transfer to CCU and start IV NTG -patient to be started on heparin drip once INR less than 2 -continue plavix, b-blocker, statins and nitrates  Barton Dubois [phone]

## 2014-05-14 NOTE — ED Notes (Signed)
Pt reports lightheadedness while brushing teeth tonight; pt reports episode of angina tonight before bed; pt reports taking one nitro SL with relief and was able to go to bed; second episode of angina at 11p and took one SL nitro with relief and was told to call 911 with use of nitro; pt denies sob, n/v; pt describes cp as pressure; pt reports currently in cardiac rehab and expected graduation date of may 2016.

## 2014-05-15 ENCOUNTER — Ambulatory Visit: Payer: Medicare Other | Admitting: Oncology

## 2014-05-15 ENCOUNTER — Encounter (HOSPITAL_COMMUNITY): Payer: Medicare Other

## 2014-05-15 ENCOUNTER — Other Ambulatory Visit: Payer: Self-pay

## 2014-05-15 ENCOUNTER — Other Ambulatory Visit: Payer: Medicare Other

## 2014-05-15 DIAGNOSIS — I209 Angina pectoris, unspecified: Secondary | ICD-10-CM

## 2014-05-15 DIAGNOSIS — I5040 Unspecified combined systolic (congestive) and diastolic (congestive) heart failure: Secondary | ICD-10-CM

## 2014-05-15 DIAGNOSIS — I4892 Unspecified atrial flutter: Secondary | ICD-10-CM

## 2014-05-15 DIAGNOSIS — E785 Hyperlipidemia, unspecified: Secondary | ICD-10-CM

## 2014-05-15 DIAGNOSIS — G4733 Obstructive sleep apnea (adult) (pediatric): Secondary | ICD-10-CM

## 2014-05-15 DIAGNOSIS — I495 Sick sinus syndrome: Secondary | ICD-10-CM

## 2014-05-15 DIAGNOSIS — R0789 Other chest pain: Secondary | ICD-10-CM

## 2014-05-15 DIAGNOSIS — I10 Essential (primary) hypertension: Secondary | ICD-10-CM

## 2014-05-15 LAB — GLUCOSE, CAPILLARY: Glucose-Capillary: 81 mg/dL (ref 70–99)

## 2014-05-15 LAB — PROTIME-INR
INR: 2.64 — AB (ref 0.00–1.49)
PROTHROMBIN TIME: 28.4 s — AB (ref 11.6–15.2)

## 2014-05-15 MED ORDER — FUROSEMIDE 40 MG PO TABS
60.0000 mg | ORAL_TABLET | Freq: Every day | ORAL | Status: DC
  Administered 2014-05-15 – 2014-05-17 (×3): 60 mg via ORAL
  Filled 2014-05-15 (×3): qty 1

## 2014-05-15 NOTE — Progress Notes (Signed)
Pt is refusing to wear CPAP tonight. RT made pt aware that if she changed her mind to call.

## 2014-05-15 NOTE — Progress Notes (Signed)
Patient Name: Patty [name] Date of Encounter: 05/15/2014     Principal Problem:   Chest pain Active Problems:   HTN (hypertension)   Hyperlipidemia   Renal artery stenosis   Obesity   Tachycardia-bradycardia syndrome   Combined systolic and diastolic heart failure, NYHA class 3   Pacemaker   Atrial flutter, unspecified   Coronary artery disease due to lipid rich plaque   OSA on CPAP   Pain in the chest    SUBJECTIVE  Feels better. No chest pain, palpitation or SOB. Ordered vegetable and salad for lunch.   CURRENT MEDS . atorvastatin  80 mg Oral q1800  . calcium-vitamin D  1 tablet Oral Q breakfast  . clopidogrel  75 mg Oral Daily  . dextromethorphan-guaiFENesin  1 tablet Oral BID  . diltiazem  180 mg Oral Daily  . furosemide  60 mg Oral Daily  . isosorbide mononitrate  60 mg Oral Daily  . lisinopril  5 mg Oral BID  . metoprolol tartrate  100 mg Oral BID  . multivitamin with minerals  1 tablet Oral q morning - 10a  . multivitamin-lutein  1 capsule Oral Daily  . omega-3 acid ethyl esters  1 g Oral Daily  . potassium chloride SA  20 mEq Oral Daily  . sodium chloride  3 mL Intravenous Q12H  . sodium chloride  3 mL Intravenous Q12H  . spironolactone  12.5 mg Oral QHS    OBJECTIVE  Filed Vitals:   05/14/14 2225 05/15/14 0029 05/15/14 0615 05/15/14 0947  BP:  115/62 147/72 102/79  Pulse: 62 50 112 70  Temp:  97.7 F (36.5 C) 98.5 F (36.9 C)   TempSrc:  Oral Oral   Resp: 18 18 18    Height:      Weight:   171 lb 15.3 oz (78 kg)   SpO2: 96% 96% 96% 96%    Intake/Output Summary (Last 24 hours) at 05/15/14 1017 Last data filed at 05/15/14 0947  Gross per 24 hour  Intake   1568 ml  Output   1350 ml  Net    218 ml   Filed Weights   05/14/14 0011 05/14/14 0341 05/15/14 0615  Weight: 170 lb (77.111 kg) 171 lb 4.8 oz (77.701 kg) 171 lb 15.3 oz (78 kg)    PHYSICAL EXAM  General: Pleasant, NAD. Neuro: Alert and oriented X 3. Moves all extremities  spontaneously. Psych: Normal affect. HEENT:  Normal  Neck: Supple without bruits or JVD. Lungs:  Resp regular and unlabored, CTA. Heart: RRR no s3, s4, or murmurs. Abdomen: Soft, non-tender, non-distended, BS + x 4.  Extremities: No clubbing, cyanosis or edema. DP/PT/Radials 2+ and equal bilaterally.  Accessory Clinical Findings  CBC  Recent Labs  05/14/14 0050 05/14/14 0100 05/14/14 0540  WBC 6.6  --  5.9  NEUTROABS 4.3  --   --   HGB 13.2 13.6 12.6  HCT 40.2 40.0 38.5  MCV 94.8  --  95.1  PLT 199  --  782   Basic Metabolic Panel  Recent Labs  05/14/14 0100 05/14/14 0540  NA 144 144  K 3.7 3.9  CL 105 109  CO2  --  28  GLUCOSE 127* 105*  BUN 43* 38*  CREATININE 1.10 1.10  CALCIUM  --  8.6   Liver Function Tests  Recent Labs  05/14/14 0540  AST 26  ALT 18  ALKPHOS 72  BILITOT 0.5  PROT 6.1  ALBUMIN 3.0*   Cardiac Enzymes  Recent Labs  05/14/14 0540 05/14/14 1522 05/14/14 1700  TROPONINI 0.05* 0.07* 0.09*   TELE Irregular with A.fib/A. Flutter HR 60s. Occasional spike of 100  2D echo 05/14/14 Left ventricle: Abnormal septal motion The cavity size was normal. Wall thickness was increased in a pattern of moderate LVH. Systolic function was mildly reduced. The estimated ejection fraction was in the range of 45% to 50%. Diffuse hypokinesis. - Left atrium: The atrium was mildly dilated.   Radiology/Studies  Dg Chest Port 1 View  05/14/2014   CLINICAL DATA:  Chest pain.  EXAM: PORTABLE CHEST - 1 VIEW  COMPARISON:  One-view chest x-ray 01/05/2014  FINDINGS: The heart is mildly enlarged. Aeration is improved. Mild bibasilar atelectasis is evident. There is no significant airspace consolidation. Atherosclerotic changes are noted at the arch. Mild degenerative changes are present in the [facility] joints bilaterally.  IMPRESSION: 1. Cardiomegaly without failure. 2. Mild bibasilar airspace disease likely reflects atelectasis. 3. Improved aeration since the  prior exam.   Electronically Signed   By: San Morelle M.D.   On: 05/14/2014 01:33    ASSESSMENT AND PLAN  81 y.o. W/ a long history of CAD s/p multiple stents, LBBB, chronic combined systolic/diastolic HF, PVD, HTN, HLD, tachy-brady s/p PPM (leadless), obesity, OSA on CPAP and chronic atrial fib with a CHADSVASC of at least 5 on coumadin who presented to [facility] on 05/14/14 with chest pain.   1. Chest Pain:  - Known CAD on  plavix, nitrates, ACE and beta blocker  - Hold Coumadin for cath  - Cath possibly Tuesday depending on INR. INR today 2.64. She ordered vegetable for lunch.  - Start heparin when INR less than 2.0 - Trop trend upward to 0.09. She is symptom free - Use IV nitro and transfer to unit for more pain.   2. A. Fibrillation/flutter - Continue beta blocker and Cardizem CD 180mg  - Hold coumadin for cath  - Only on plavix for CAD due to anticoagulation  3. Combined systolic and diastolic congestive heart failure:  -Given Lasix 60mg  PO today - 2D ech 05/14/14 LV EF 45-50%. Systolic function mildly reduced. Diffuse hypokinesis. 2-D echo on 12/31/11 showed EF 60-65%.  - She is on Lasix 80mg  AM and 40mg  PM PRN. On lasix 60mg  po qd here. Continue lasix, spiro 25mg  qd, metoprolol 100mg  BID, and lisinopril 5mg  BID.   - No sign of volume overload.  - Cr 1.10 and K 3.9 today. GFR of 46.  - Net I/O -720   4. LBBB:  - Old , chronic no high grade AV block   5. Chol:  - Continue statin    6. Tachycardia-bradycardia syndrome: - HR in 60s - Pacemaker  Signed, Eileen Stanford PA-C  Pager [phone]   Attending Note:   The patient was seen and examined.  Agree with assessment and plan as noted above.  Changes made to the above note as needed.  Very pleasant lady. Presented with CP.   Plan is for cath once INR is low enough. Likely Wednesday.   Thayer Headings, Brooke Bonito., MD, [facility] 05/15/2014, 11:33 AM [address],  [place] Pager [phone]

## 2014-05-15 NOTE — Progress Notes (Signed)
OT Cancellation Note  Patient Details Name: Patty [name] MRN: [id number] DOB: 1930-07-12   Cancelled Treatment:    Reason Eval/Treat Not Completed: OT screened, no needs identified, will sign off. Spoke to PT in hallway not long after she had evaluated the pt and she reports that pt is Independent with all mobility and does not foresee any issues with BADLs. We will sign off.  Almon Register [phone] 05/15/2014, 4:21 PM

## 2014-05-15 NOTE — Progress Notes (Signed)
[facility] PROGRESS NOTE  Patty [name] WER:[id number] DOB: 12/10/1930 DOA: 05/14/2014 PCP: Dorian Heckle, MD  Assessment/Plan: 1-chest pain: with multiple risk factors and known CAD.  -slight elevation seen on troponin -2-D echo with EF 45-50% and diffuse hypokinesis -currently CP free -will continue plavix, ACEi, nitrates and beta blockers -if further CP, will initiate IV NTG and will transfer to CCU -cardiology on board; plan is for cath once coumadin low enouch -will follow rec's  2-atrial fibrillation/flutter: -rate controlled -continue b-blocker and cardizem -coumadin on hold in anticipation for cath -plan is to initiate heparin once INR < 2  3-combined systolic and diastolic congestive heart fialure: chronic and compensated -continue ACEi and B-blocker -will resume lasix at 60mg  daily -continue spironolactone 25mg  daily  4-LBBB/tachy-brady syndrome -old and unchanged -no high grade AV block -patient s/p pacemaker implantation   5-HLD: continue statins  6-OSA: continue CPAP   Code Status: DNR Family Communication: no family at bedside Disposition Plan: remains inpatient. Plan for cath soon (waiting on coumadin level to drift down)   Consultants:  Cardiology    Procedures:  2-D echo:  - Left ventricle: Abnormal septal motion The cavity size was normal. Wall thickness was increased in a pattern of moderate LVH. Systolic function was mildly reduced. The estimated ejection fraction was in the range of 45% to 50%. Diffuse hypokinesis. - Left atrium: The atrium was mildly dilated.  Antibiotics:  None   HPI/Subjective: Feeling better, no fever and currently without any further CP or SOB.  Objective: Filed Vitals:   05/15/14 0947  BP: 102/79  Pulse: 70  Temp:   Resp:     Intake/Output Summary (Last 24 hours) at 05/15/14 1224 Last data filed at 05/15/14 1143  Gross per 24 hour  Intake   1568 ml  Output   1950 ml  Net   -382 ml    Filed Weights   05/14/14 0011 05/14/14 0341 05/15/14 0615  Weight: 77.111 kg (170 lb) 77.701 kg (171 lb 4.8 oz) 78 kg (171 lb 15.3 oz)    Exam:   General:  Afebrile; no acute distress. Patient without CP today; denies SOB  Cardiovascular: reate controlled, positive SEM, no rubs or gallops  Respiratory: no wheezing, no crackles, good air movement   Abdomen: soft, NT, ND, positive BS  Musculoskeletal: no edema, SCD's in place; no cyanosis   Data Reviewed: Basic Metabolic Panel:  Recent Labs Lab 05/14/14 0100 05/14/14 0540  NA 144 144  K 3.7 3.9  CL 105 109  CO2  --  28  GLUCOSE 127* 105*  BUN 43* 38*  CREATININE 1.10 1.10  CALCIUM  --  8.6   Liver Function Tests:  Recent Labs Lab 05/14/14 0540  AST 26  ALT 18  ALKPHOS 72  BILITOT 0.5  PROT 6.1  ALBUMIN 3.0*   CBC:  Recent Labs Lab 05/14/14 0050 05/14/14 0100 05/14/14 0540  WBC 6.6  --  5.9  NEUTROABS 4.3  --   --   HGB 13.2 13.6 12.6  HCT 40.2 40.0 38.5  MCV 94.8  --  95.1  PLT 199  --  184   Cardiac Enzymes:  Recent Labs Lab 05/14/14 0540 05/14/14 1522 05/14/14 1700  TROPONINI 0.05* 0.07* 0.09*   BNP (last 3 results)  Recent Labs  05/14/14 0540  BNP 386.6*    ProBNP (last 3 results)  Recent Labs  10/04/13 1221 12/11/13 1101 01/05/14 1027  PROBNP 1963.0* 1383.0* 1341.0*    CBG:  Recent Labs  Lab 05/14/14 0648 05/15/14 0619  GLUCAP 102* 81   Studies: Dg Chest Port 1 View  05/14/2014   CLINICAL DATA:  Chest pain.  EXAM: PORTABLE CHEST - 1 VIEW  COMPARISON:  One-view chest x-ray 01/05/2014  FINDINGS: The heart is mildly enlarged. Aeration is improved. Mild bibasilar atelectasis is evident. There is no significant airspace consolidation. Atherosclerotic changes are noted at the arch. Mild degenerative changes are present in the [facility] joints bilaterally.  IMPRESSION: 1. Cardiomegaly without failure. 2. Mild bibasilar airspace disease likely reflects atelectasis. 3. Improved  aeration since the prior exam.   Electronically Signed   By: San Morelle M.D.   On: 05/14/2014 01:33    Scheduled Meds: . atorvastatin  80 mg Oral q1800  . calcium-vitamin D  1 tablet Oral Q breakfast  . clopidogrel  75 mg Oral Daily  . dextromethorphan-guaiFENesin  1 tablet Oral BID  . diltiazem  180 mg Oral Daily  . furosemide  60 mg Oral Daily  . isosorbide mononitrate  60 mg Oral Daily  . lisinopril  5 mg Oral BID  . metoprolol tartrate  100 mg Oral BID  . multivitamin with minerals  1 tablet Oral q morning - 10a  . multivitamin-lutein  1 capsule Oral Daily  . omega-3 acid ethyl esters  1 g Oral Daily  . potassium chloride SA  20 mEq Oral Daily  . sodium chloride  3 mL Intravenous Q12H  . sodium chloride  3 mL Intravenous Q12H  . spironolactone  12.5 mg Oral QHS   Continuous Infusions:   Principal Problem:   Chest pain Active Problems:   HTN (hypertension)   Hyperlipidemia   Renal artery stenosis   Obesity   Tachycardia-bradycardia syndrome   Combined systolic and diastolic heart failure, NYHA class 3   Pacemaker   Atrial flutter, unspecified   Coronary artery disease due to lipid rich plaque   OSA on CPAP   Pain in the chest    Time spent: 30 minutes    Barton Dubois  [facility] Pager [phone]. If 7PM-7AM, please contact night-coverage at [email or web address], password [facility] 05/15/2014, 12:24 PM  LOS: 1 day

## 2014-05-15 NOTE — Care Management Note (Unsigned)
    Page 1 of 1   05/15/2014     3:17:39 PM CARE MANAGEMENT NOTE 05/15/2014  Patient:  Patty [name], Patty [name]   Account Number:  [id number]  Date Initiated:  05/15/2014  Documentation initiated by:  Chantale Leugers  Subjective/Objective Assessment:   Pt adm on 05/14/14 with chest pain.  PTA, pt resides at home with husband.     Action/Plan:   PT recommending no OP follow up at dc.  Will cont to follow for discharge needs.   Anticipated DC Date:  05/16/2014   Anticipated DC Plan:  [place]  CM consult      Choice offered to / List presented to:             Status of service:  In process, will continue to follow Medicare Important Message given?   (If response is "NO", the following Medicare IM given date fields will be blank) Date Medicare IM given:   Medicare IM given by:   Date Additional Medicare IM given:   Additional Medicare IM given by:    Discharge Disposition:    Per UR Regulation:  Reviewed for med. necessity/level of care/duration of stay  If discussed at [place] of Stay Meetings, dates discussed:    Comments:

## 2014-05-15 NOTE — Evaluation (Addendum)
Physical Therapy Evaluation Patient Details Name: Patty [name] MRN: [id number] DOB: 07/06/1930 Today's Date: 05/15/2014   History of Present Illness  81 y.o. W/ a long history of CAD s/p multiple stents, LBBB, chronic combined systolic/diastolic HF, PVD, HTN, HLD, tachy-brady s/p PPM (leadless), obesity, OSA on CPAP and chronic atrial fib with a CHADSVASC of at least 5 on coumadin who presented to [facility] on 05/14/14 with chest pain. Pt with very mild troponin elevation not clinically significant and awaiting decreasd INR for cardiac cath.  Clinical Impression  Pt very pleasant, no chest pain today and has been up and ambulating in the room throughout the day. Pt highly active and independent without limitations from balance or activity tolerance per pt report. Pt aware of delay in receiving cardiac cath and educated to limit activity based on HR until that time. Pt at baseline functional mobility without further need for therapy intervention and recommend continuation of her outpatient cardiac rehab at d/C as well as daily mobility with nursing supervision.     Follow Up Recommendations No PT follow up    Equipment Recommendations  None recommended by PT    Recommendations for Other Services       Precautions / Restrictions Precautions Precautions: None      Mobility  Bed Mobility Overal bed mobility: Independent                Transfers Overall transfer level: Independent                  Ambulation/Gait Ambulation/Gait assistance: Independent Ambulation Distance (Feet): 60 Feet Assistive device: None Gait Pattern/deviations: WFL(Within Functional Limits)     General Gait Details: limited by HR  Stairs            Wheelchair Mobility    Modified Rankin (Stroke Patients Only)       Balance                                             Pertinent Vitals/Pain Pain Assessment: No/denies pain  HR 67 with transfer to EOB with up to  93 with limited gait  At rest in chair 51 No pain or SOB throughout    Home Living Family/patient expects to be discharged to:: Private residence Living Arrangements: Spouse/significant other Available Help at Discharge: Family;Available 24 hours/day Type of Home: House       Home Layout: One level Home Equipment: None      Prior Function Level of Independence: Independent               Hand Dominance        Extremity/Trunk Assessment   Upper Extremity Assessment: Overall WFL for tasks assessed           Lower Extremity Assessment: Overall WFL for tasks assessed      Cervical / Trunk Assessment: Normal  Communication   Communication: No difficulties  Cognition Arousal/Alertness: Awake/alert Behavior During Therapy: WFL for tasks assessed/performed Overall Cognitive Status: Within Functional Limits for tasks assessed                      General Comments      Exercises        Assessment/Plan    PT Assessment Patent does not need any further PT services  PT Diagnosis     PT Problem List  PT Treatment Interventions     PT Goals (Current goals can be found in the Care Plan section) Acute Rehab PT Goals PT Goal Formulation: All assessment and education complete, DC therapy    Frequency     Barriers to discharge        Co-evaluation               End of Session   Activity Tolerance: Patient tolerated treatment well Patient left: in chair;with call bell/phone within reach;with family/visitor present Nurse Communication: Mobility status         Time: 8887-5797 PT Time Calculation (min) (ACUTE ONLY): 21 min   Charges:   PT Evaluation $Initial PT Evaluation Tier I: 1 Procedure     PT G CodesMelford Aase 05/15/2014, 2:09 PM  Elwyn Reach, [place]

## 2014-05-16 ENCOUNTER — Encounter (HOSPITAL_COMMUNITY)
Admission: EM | Disposition: A | Discharge status: Home / Self Care | Payer: Medicare Other | Source: Home / Self Care | Attending: Internal Medicine

## 2014-05-16 ENCOUNTER — Encounter (HOSPITAL_COMMUNITY): Payer: Self-pay | Admitting: Cardiovascular Disease

## 2014-05-16 ENCOUNTER — Encounter (HOSPITAL_COMMUNITY)
Admission: EM | Disposition: A | Discharge status: Home / Self Care | Payer: Self-pay | Source: Home / Self Care | Attending: Internal Medicine

## 2014-05-16 DIAGNOSIS — I25110 Atherosclerotic heart disease of native coronary artery with unstable angina pectoris: Principal | ICD-10-CM

## 2014-05-16 HISTORY — PX: LEFT HEART CATHETERIZATION WITH CORONARY ANGIOGRAM: SHX5451

## 2014-05-16 LAB — GLUCOSE, CAPILLARY: Glucose-Capillary: 85 mg/dL (ref 70–99)

## 2014-05-16 LAB — PROTIME-INR
INR: 1.85 — ABNORMAL HIGH (ref 0.00–1.49)
PROTHROMBIN TIME: 21.5 s — AB (ref 11.6–15.2)

## 2014-05-16 SURGERY — LEFT HEART CATHETERIZATION WITH CORONARY ANGIOGRAM
Anesthesia: LOCAL

## 2014-05-16 MED ORDER — SODIUM CHLORIDE 0.9 % IV SOLN
INTRAVENOUS | Status: DC
  Administered 2014-05-16: 07:00:00 via INTRAVENOUS

## 2014-05-16 MED ORDER — SODIUM CHLORIDE 0.9 % IV SOLN
1.0000 mL/kg/h | INTRAVENOUS | Status: AC
  Administered 2014-05-16: 1 mL/kg/h via INTRAVENOUS

## 2014-05-16 MED ORDER — HEPARIN (PORCINE) IN NACL 100-0.45 UNIT/ML-% IJ SOLN
1100.0000 [IU]/h | INTRAMUSCULAR | Status: DC
  Administered 2014-05-16: 1100 [IU]/h via INTRAVENOUS
  Filled 2014-05-16 (×2): qty 250

## 2014-05-16 MED ORDER — WARFARIN - PHARMACIST DOSING INPATIENT
Freq: Every day | Status: DC
  Administered 2014-05-16: 22:00:00

## 2014-05-16 MED ORDER — COUMADIN BOOK
1.0000 | Freq: Once | Status: AC
  Administered 2014-05-16: 1
  Filled 2014-05-16: qty 1

## 2014-05-16 MED ORDER — SODIUM CHLORIDE 0.9 % IJ SOLN
3.0000 mL | Freq: Two times a day (BID) | INTRAMUSCULAR | Status: DC
  Administered 2014-05-16: 3 mL via INTRAVENOUS

## 2014-05-16 MED ORDER — WARFARIN SODIUM 3 MG PO TABS
3.0000 mg | ORAL_TABLET | Freq: Once | ORAL | Status: AC
  Administered 2014-05-16: 22:00:00 3 mg via ORAL
  Filled 2014-05-16: qty 1

## 2014-05-16 MED ORDER — HEPARIN (PORCINE) IN NACL 2-0.9 UNIT/ML-% IJ SOLN
INTRAMUSCULAR | Status: AC
  Filled 2014-05-16: qty 1000

## 2014-05-16 MED ORDER — ASPIRIN 81 MG PO CHEW
81.0000 mg | CHEWABLE_TABLET | ORAL | Status: AC
  Administered 2014-05-16: 81 mg via ORAL
  Filled 2014-05-16: qty 1

## 2014-05-16 MED ORDER — ASPIRIN 81 MG PO CHEW
81.0000 mg | CHEWABLE_TABLET | Freq: Every day | ORAL | Status: DC
  Administered 2014-05-17: 81 mg via ORAL
  Filled 2014-05-16: qty 1

## 2014-05-16 MED ORDER — ONDANSETRON HCL 4 MG/2ML IJ SOLN: 4.0000 mg | Freq: Four times a day (QID) | INTRAMUSCULAR | Status: DC | PRN

## 2014-05-16 MED ORDER — FENTANYL CITRATE 0.05 MG/ML IJ SOLN
INTRAMUSCULAR | Status: AC
  Filled 2014-05-16: qty 2

## 2014-05-16 MED ORDER — SODIUM CHLORIDE 0.9 % IV SOLN: 250.0000 mL | INTRAVENOUS | Status: DC | PRN

## 2014-05-16 MED ORDER — NITROGLYCERIN 1 MG/10 ML FOR IR/CATH LAB
INTRA_ARTERIAL | Status: AC
  Filled 2014-05-16: qty 10

## 2014-05-16 MED ORDER — LIDOCAINE HCL (PF) 1 % IJ SOLN
INTRAMUSCULAR | Status: AC
  Filled 2014-05-16: qty 30

## 2014-05-16 MED ORDER — HEPARIN SODIUM (PORCINE) 1000 UNIT/ML IJ SOLN
INTRAMUSCULAR | Status: AC
  Filled 2014-05-16: qty 1

## 2014-05-16 MED ORDER — BIVALIRUDIN 250 MG IV SOLR
INTRAVENOUS | Status: AC
  Filled 2014-05-16: qty 250

## 2014-05-16 MED ORDER — WARFARIN VIDEO: 1.0000 | Freq: Once | Status: DC

## 2014-05-16 MED ORDER — SODIUM CHLORIDE 0.9 % IJ SOLN: 3.0000 mL | INTRAMUSCULAR | Status: DC | PRN

## 2014-05-16 MED ORDER — SODIUM CHLORIDE 0.9 % IJ SOLN: 3.0000 mL | Freq: Two times a day (BID) | INTRAMUSCULAR | Status: DC

## 2014-05-16 MED ORDER — MIDAZOLAM HCL 2 MG/2ML IJ SOLN
INTRAMUSCULAR | Status: AC
  Filled 2014-05-16: qty 2

## 2014-05-16 MED ORDER — VERAPAMIL HCL 2.5 MG/ML IV SOLN
INTRAVENOUS | Status: AC
  Filled 2014-05-16: qty 2

## 2014-05-16 MED ORDER — OXYCODONE-ACETAMINOPHEN 5-325 MG PO TABS: 1.0000 | ORAL_TABLET | ORAL | Status: DC | PRN

## 2014-05-16 NOTE — Progress Notes (Signed)
ANTICOAGULATION CONSULT NOTE - Initial Consult  Pharmacy Consult for Coumadin Indication:  AFib  Allergies  Allergen Reactions  . Bee Venom Anaphylaxis  . Other     Pain medications cause severe vomiting. Tolerated slow IV morphine drip    Patient Measurements: Height: 5' (152.4 cm) Weight: 169 lb 14.4 oz (77.066 kg) (scale c) IBW/kg (Calculated) : 45.5 Heparin Dosing Weight:   Vital Signs: Temp: 97.6 F (36.4 C) (04/12 1303) Temp Source: Oral (04/12 1303) BP: 126/68 mmHg (04/12 1303) Pulse Rate: 99 (04/12 1517)  Labs:  Recent Labs  05/14/14 0050 05/14/14 0100 05/14/14 0540 05/14/14 1522 05/14/14 1700 05/15/14 0345 05/16/14 0510  HGB 13.2 13.6 12.6  --   --   --   --   HCT 40.2 40.0 38.5  --   --   --   --   PLT 199  --  184  --   --   --   --   LABPROT 29.7*  --   --   --   --  28.4* 21.5*  INR 2.80*  --   --   --   --  2.64* 1.85*  CREATININE  --  1.10 1.10  --   --   --   --   TROPONINI  --   --  0.05* 0.07* 0.09*  --   --     Estimated Creatinine Clearance: 36.8 mL/min (by C-G formula based on Cr of 1.1).   Medical History: Past Medical History  Diagnosis Date  . Hypertension   . Hyperlipidemia   . Obesity   . Diverticulitis   . Peripheral vascular disease   . Varicose veins   . Granulosa cell carcinoma     abd; last episode was in 2009  . Tachycardia-bradycardia syndrome     a. s/p leadless pacemaker ([name]) implanted by Dr [name]  . Atrial fibrillation 2013  . OSA on CPAP   . Combined systolic and diastolic heart failure, NYHA class 3   . Coronary artery disease     a. s/p multiple stents  b. LHC 12/14/13 with sig CAD with TO of OM1, 70-80% occusion OM2, 50-70% mLCx, 50-70% pLAD (recent FFR 0.83), diffusely diseased RCA, widely patent stetns in pLAD and LCx.  Marland Kitchen LBBB (left bundle branch block)     chronic  . Pacemaker   . Myocardial infarction 2002  . Renal artery stenosis     Medications:  Scheduled:  . aspirin  81 mg Oral Daily  .  atorvastatin  80 mg Oral q1800  . calcium-vitamin D  1 tablet Oral Q breakfast  . clopidogrel  75 mg Oral Daily  . dextromethorphan-guaiFENesin  1 tablet Oral BID  . diltiazem  180 mg Oral Daily  . furosemide  60 mg Oral Daily  . isosorbide mononitrate  60 mg Oral Daily  . lisinopril  5 mg Oral BID  . metoprolol tartrate  100 mg Oral BID  . multivitamin with minerals  1 tablet Oral q morning - 10a  . potassium chloride SA  20 mEq Oral Daily  . sodium chloride  3 mL Intravenous Q12H  . [START ON 05/17/2014] sodium chloride  3 mL Intravenous Q12H  . spironolactone  12.5 mg Oral QHS    Assessment: 81yo female on Coumadin pta for AFib, who has been on heparin this admission for ACS.  Now s/p cath, to d/c heparin and resume Coumadin.  INR this AM 1.85.  INR therapeutic on admission on home dose  of 2mg  daily except 1mg  Tues and Friday.  Her last Coumadin dose was 4/9.  Will give her a larger dose tonight to try to boost INR in right direction, then plan on resuming home dose.  Goal of Therapy:  INR 2-3 Monitor platelets by anticoagulation protocol: Yes   Plan:  Coumadin 3mg  tonight Daily INR Coumadin book and video  Gracy Bruins, PharmD Clinical Pharmacist [facility]

## 2014-05-16 NOTE — Interval H&P Note (Signed)
History and Physical Interval Note:  05/16/2014 3:31 PM  Patty [name]  has presented today for surgery, with the diagnosis of usntable angina  The various methods of treatment have been discussed with the patient and family. After consideration of risks, benefits and other options for treatment, the patient has consented to  Procedure(s): LEFT HEART CATHETERIZATION WITH CORONARY ANGIOGRAM (N/A) as a surgical intervention .  The patient's history has been reviewed, patient examined, no change in status, stable for surgery.  I have reviewed the patient's chart and labs.  Questions were answered to the patient's satisfaction.    Cath Lab Visit (complete for each Cath Lab visit)  Clinical Evaluation Leading to the Procedure:   ACS: Yes.    Non-ACS:    Anginal Classification: CCS IV  Anti-ischemic medical therapy: Minimal Therapy (1 class of medications)  Non-Invasive Test Results: No non-invasive testing performed  Prior CABG: No previous CABG       Sherren Mocha

## 2014-05-16 NOTE — Progress Notes (Signed)
Patient Name: Patty [name] Date of Encounter: 05/16/2014     Principal Problem:   Chest pain Active Problems:   HTN (hypertension)   Hyperlipidemia   Renal artery stenosis   Obesity   Tachycardia-bradycardia syndrome   Combined systolic and diastolic heart failure, NYHA class 3   Pacemaker   Atrial flutter, unspecified   Coronary artery disease due to lipid rich plaque   OSA on CPAP   Pain in the chest   Ischemic chest pain    SUBJECTIVE  Feels better and ready to go for cath.  No chest pain, palpitation or SOB.   CURRENT MEDS . atorvastatin  80 mg Oral q1800  . calcium-vitamin D  1 tablet Oral Q breakfast  . clopidogrel  75 mg Oral Daily  . dextromethorphan-guaiFENesin  1 tablet Oral BID  . diltiazem  180 mg Oral Daily  . furosemide  60 mg Oral Daily  . isosorbide mononitrate  60 mg Oral Daily  . lisinopril  5 mg Oral BID  . metoprolol tartrate  100 mg Oral BID  . multivitamin with minerals  1 tablet Oral q morning - 10a  . multivitamin-lutein  1 capsule Oral Daily  . omega-3 acid ethyl esters  1 g Oral Daily  . potassium chloride SA  20 mEq Oral Daily  . sodium chloride  3 mL Intravenous Q12H  . sodium chloride  3 mL Intravenous Q12H  . sodium chloride  3 mL Intravenous Q12H  . spironolactone  12.5 mg Oral QHS    OBJECTIVE  Filed Vitals:   05/15/14 1803 05/15/14 2102 05/16/14 0148 05/16/14 0535  BP: 110/87 147/94 120/83 115/77  Pulse: 71 94 98 83  Temp: 97.3 F (36.3 C) 97.9 F (36.6 C) 97.9 F (36.6 C) 97.9 F (36.6 C)  TempSrc: Oral Oral Oral Oral  Resp: 18 20 20 18   Height:      Weight:    169 lb 14.4 oz (77.066 kg)  SpO2: 94% 98% 95% 96%    Intake/Output Summary (Last 24 hours) at 05/16/14 0907 Last data filed at 05/16/14 0815  Gross per 24 hour  Intake    740 ml  Output   3300 ml  Net  -2560 ml   Filed Weights   05/14/14 0341 05/15/14 0615 05/16/14 0535  Weight: 171 lb 4.8 oz (77.701 kg) 171 lb 15.3 oz (78 kg) 169 lb 14.4 oz  (77.066 kg)    PHYSICAL EXAM  General: Pleasant, NAD. Neuro: Alert and oriented X 3. Moves all extremities spontaneously. Psych: Normal affect. HEENT:  Normal  Neck: Supple without bruits or JVD. Lungs:  Resp regular and unlabored, CTA. Heart: RRR no s3, s4, or murmurs. Abdomen: Soft, non-tender, non-distended, BS + x 4.  Extremities: No clubbing, cyanosis or edema. DP/PT/Radials 2+ and equal bilaterally.  Accessory Clinical Findings  CBC  Recent Labs  05/14/14 0050 05/14/14 0100 05/14/14 0540  WBC 6.6  --  5.9  NEUTROABS 4.3  --   --   HGB 13.2 13.6 12.6  HCT 40.2 40.0 38.5  MCV 94.8  --  95.1  PLT 199  --  161   Basic Metabolic Panel  Recent Labs  05/14/14 0100 05/14/14 0540  NA 144 144  K 3.7 3.9  CL 105 109  CO2  --  28  GLUCOSE 127* 105*  BUN 43* 38*  CREATININE 1.10 1.10  CALCIUM  --  8.6   Liver Function Tests  Recent Labs  05/14/14 0540  AST 26  ALT 18  ALKPHOS 72  BILITOT 0.5  PROT 6.1  ALBUMIN 3.0*   Cardiac Enzymes  Recent Labs  05/14/14 0540 05/14/14 1522 05/14/14 1700  TROPONINI 0.05* 0.07* 0.09*   TELE Irregular with A.fib/A. Flutter HR 70s. Occasional spike of 100  2D echo 05/14/14 Left ventricle: Abnormal septal motion The cavity size was normal. Wall thickness was increased in a pattern of moderate LVH. Systolic function was mildly reduced. The estimated ejection fraction was in the range of 45% to 50%. Diffuse hypokinesis. - Left atrium: The atrium was mildly dilated.   Radiology/Studies  Dg Chest Port 1 View  05/14/2014   CLINICAL DATA:  Chest pain.  EXAM: PORTABLE CHEST - 1 VIEW  COMPARISON:  One-view chest x-ray 01/05/2014  FINDINGS: The heart is mildly enlarged. Aeration is improved. Mild bibasilar atelectasis is evident. There is no significant airspace consolidation. Atherosclerotic changes are noted at the arch. Mild degenerative changes are present in the [employer] joints bilaterally.  IMPRESSION: 1. Cardiomegaly  without failure. 2. Mild bibasilar airspace disease likely reflects atelectasis. 3. Improved aeration since the prior exam.   Electronically Signed   By: San Morelle M.D.   On: 05/14/2014 01:33    ASSESSMENT AND PLAN  81 y.o. W/ a long history of CAD s/p multiple stents, LBBB, chronic combined systolic/diastolic HF, PVD, HTN, HLD, tachy-brady s/p PPM (leadless), obesity, OSA on CPAP and chronic atrial fib with a CHADSVASC of at least 5 on coumadin who presented to [facility] on 05/14/14 with chest pain.   1. Chest Pain:  - Known CAD on  plavix, nitrates, ACE and beta blocker  - Hold Coumadin for cath. Started heparin today. Aspirin 81mg  given this morning.    - Cath today. INR down to 1.85.   - Trop trend upward to 0.09. She is symptom free  2. A. Fibrillation/flutter - Continue beta blocker and Cardizem CD 180mg  - Hold coumadin for cath  - Only on plavix for CAD due to anticoagulation  3. Combined systolic and diastolic congestive heart failure:  - 2D ech 05/14/14 LV EF 45-50%. Systolic function mildly reduced. Diffuse hypokinesis. 2-D echo on 12/31/11 showed EF 60-65%.  - She is on Lasix 80mg  AM and 40mg  PM PRN. On lasix 60mg  po qd here. Continue lasix, spiro 25mg  qd, metoprolol 100mg  BID, and lisinopril 5mg  BID.   - No sign of volume overload.  - Net I/O -1962  4. LBBB:  - Old , chronic no high grade AV block   5. Chol:  - Continue statin    6. Tachycardia-bradycardia syndrome: - HR in 63s - Pacemaker  Signed, Bhagat,Bhavinkumar PA-C  Pager [phone]   Attending Note:   The patient was seen and examined.  Agree with assessment and plan as noted above.  Changes made to the above note as needed.  Patient is doing well.  For cath today.  Troponin levels are mildly elevated.     Thayer Headings, Brooke Bonito., MD, [facility] 05/16/2014, 9:48 AM [address],  [place] Pager [phone]

## 2014-05-16 NOTE — Progress Notes (Signed)
[facility] PROGRESS NOTE  Patty [name] JIR:[id number] DOB: October 23, 1930 DOA: 05/14/2014 PCP: Dorian Heckle, MD  Assessment/Plan: 1-chest pain: with multiple risk factors and known CAD.  -slight elevation seen on troponin -2-D echo with EF 45-50% and diffuse hypokinesis -will continue plavix, ACEi, nitrates and beta blockers -if she experienced further CP, will initiate IV NTG and will transfer to CCU; so far has remained CP free -cardiology on board; plan is for cath later today (4/12); INR 1.8 -will follow rec's  2-atrial fibrillation/flutter: -rate controlled -continue b-blocker and cardizem -coumadin on hold in anticipation for cath -plan is to initiate heparin once INR < 2; pharmacy aware and helping  3-combined systolic and diastolic congestive heart fialure: chronic and compensated -continue ACEi and B-blocker -will continue lasix at 60mg  daily (while she is around cath); then resume home dose of 80mg  daily -continue spironolactone 25mg  daily -daily weights, strict I's and O's and low sodium diet  4-LBBB/tachy-brady syndrome -old and unchanged -no high grade AV block on EKG or telemetry -patient s/p pacemaker implantation   5-HLD: continue statins  6-OSA: continue CPAP QHS   Code Status: DNR Family Communication: no family at bedside Disposition Plan: remains inpatient. Plan for cath today in the afternoon (4/12); will follow cardiology rec's   Consultants:  Cardiology    Procedures:  2-D echo:  - Left ventricle: Abnormal septal motion The cavity size was normal. Wall thickness was increased in a pattern of moderate LVH. Systolic function was mildly reduced. The estimated ejection fraction was in the range of 45% to 50%. Diffuse hypokinesis. - Left atrium: The atrium was mildly dilated.  Antibiotics:  None   HPI/Subjective: Feeling ok, no CP or SOB. Patient in no acute distress. INR 1.8  Objective: Filed Vitals:   05/16/14 0943  BP:  164/65  Pulse: 95  Temp:   Resp: 18    Intake/Output Summary (Last 24 hours) at 05/16/14 1047 Last data filed at 05/16/14 0943  Gross per 24 hour  Intake    410 ml  Output   3300 ml  Net  -2890 ml   Filed Weights   05/14/14 0341 05/15/14 0615 05/16/14 0535  Weight: 77.701 kg (171 lb 4.8 oz) 78 kg (171 lb 15.3 oz) 77.066 kg (169 lb 14.4 oz)    Exam:   General:  Afebrile; no acute distress. Patient denies any CP or SOB  Cardiovascular: HR controlled, positive SEM, no rubs or gallops; no JVD appreciated  Respiratory: no wheezing, no crackles, good air movement bilaterally  Abdomen: soft, NT, ND, positive BS  Musculoskeletal: no edema, SCD's in place; no cyanosis   Data Reviewed: Basic Metabolic Panel:  Recent Labs Lab 05/14/14 0100 05/14/14 0540  NA 144 144  K 3.7 3.9  CL 105 109  CO2  --  28  GLUCOSE 127* 105*  BUN 43* 38*  CREATININE 1.10 1.10  CALCIUM  --  8.6   Liver Function Tests:  Recent Labs Lab 05/14/14 0540  AST 26  ALT 18  ALKPHOS 72  BILITOT 0.5  PROT 6.1  ALBUMIN 3.0*   CBC:  Recent Labs Lab 05/14/14 0050 05/14/14 0100 05/14/14 0540  WBC 6.6  --  5.9  NEUTROABS 4.3  --   --   HGB 13.2 13.6 12.6  HCT 40.2 40.0 38.5  MCV 94.8  --  95.1  PLT 199  --  184   Cardiac Enzymes:  Recent Labs Lab 05/14/14 0540 05/14/14 1522 05/14/14 1700  TROPONINI 0.05* 0.07* 0.09*  BNP (last 3 results)  Recent Labs  05/14/14 0540  BNP 386.6*    ProBNP (last 3 results)  Recent Labs  10/04/13 1221 12/11/13 1101 01/05/14 1027  PROBNP 1963.0* 1383.0* 1341.0*    CBG:  Recent Labs Lab 05/14/14 0648 05/15/14 0619 05/16/14 0559  GLUCAP 102* 81 85   Studies: No results found.  Scheduled Meds: . atorvastatin  80 mg Oral q1800  . calcium-vitamin D  1 tablet Oral Q breakfast  . clopidogrel  75 mg Oral Daily  . dextromethorphan-guaiFENesin  1 tablet Oral BID  . diltiazem  180 mg Oral Daily  . furosemide  60 mg Oral Daily  .  isosorbide mononitrate  60 mg Oral Daily  . lisinopril  5 mg Oral BID  . metoprolol tartrate  100 mg Oral BID  . multivitamin with minerals  1 tablet Oral q morning - 10a  . multivitamin-lutein  1 capsule Oral Daily  . omega-3 acid ethyl esters  1 g Oral Daily  . potassium chloride SA  20 mEq Oral Daily  . sodium chloride  3 mL Intravenous Q12H  . sodium chloride  3 mL Intravenous Q12H  . sodium chloride  3 mL Intravenous Q12H  . spironolactone  12.5 mg Oral QHS   Continuous Infusions: . sodium chloride 75 mL/hr at 05/16/14 0700  . heparin 1,100 Units/hr (05/16/14 0929)    Principal Problem:   Chest pain Active Problems:   HTN (hypertension)   Hyperlipidemia   Renal artery stenosis   Obesity   Tachycardia-bradycardia syndrome   Combined systolic and diastolic heart failure, NYHA class 3   Pacemaker   Atrial flutter, unspecified   Coronary artery disease due to lipid rich plaque   OSA on CPAP   Pain in the chest   Ischemic chest pain   Time spent: 30 minutes   Barton Dubois  [facility] Pager [phone]. If 7PM-7AM, please contact night-coverage at [email or web address], password [facility] 05/16/2014, 10:47 AM  LOS: 2 days

## 2014-05-16 NOTE — Progress Notes (Addendum)
Received call from Cleona at cath lab who notified me that the pt will not be returning to [facility] but will be transferred to [place] after intervention in cath lab is finished. Pt items were gathered and will be taken to pt room on 6c02.   Maurene Capes RN

## 2014-05-16 NOTE — H&P (View-Only) (Signed)
Patient Name: Patty [name] Date of Encounter: 05/16/2014     Principal Problem:   Chest pain Active Problems:   HTN (hypertension)   Hyperlipidemia   Renal artery stenosis   Obesity   Tachycardia-bradycardia syndrome   Combined systolic and diastolic heart failure, NYHA class 3   Pacemaker   Atrial flutter, unspecified   Coronary artery disease due to lipid rich plaque   OSA on CPAP   Pain in the chest   Ischemic chest pain    SUBJECTIVE  Feels better and ready to go for cath.  No chest pain, palpitation or SOB.   CURRENT MEDS . atorvastatin  80 mg Oral q1800  . calcium-vitamin D  1 tablet Oral Q breakfast  . clopidogrel  75 mg Oral Daily  . dextromethorphan-guaiFENesin  1 tablet Oral BID  . diltiazem  180 mg Oral Daily  . furosemide  60 mg Oral Daily  . isosorbide mononitrate  60 mg Oral Daily  . lisinopril  5 mg Oral BID  . metoprolol tartrate  100 mg Oral BID  . multivitamin with minerals  1 tablet Oral q morning - 10a  . multivitamin-lutein  1 capsule Oral Daily  . omega-3 acid ethyl esters  1 g Oral Daily  . potassium chloride SA  20 mEq Oral Daily  . sodium chloride  3 mL Intravenous Q12H  . sodium chloride  3 mL Intravenous Q12H  . sodium chloride  3 mL Intravenous Q12H  . spironolactone  12.5 mg Oral QHS    OBJECTIVE  Filed Vitals:   05/15/14 1803 05/15/14 2102 05/16/14 0148 05/16/14 0535  BP: 110/87 147/94 120/83 115/77  Pulse: 71 94 98 83  Temp: 97.3 F (36.3 C) 97.9 F (36.6 C) 97.9 F (36.6 C) 97.9 F (36.6 C)  TempSrc: Oral Oral Oral Oral  Resp: 18 20 20 18   Height:      Weight:    169 lb 14.4 oz (77.066 kg)  SpO2: 94% 98% 95% 96%    Intake/Output Summary (Last 24 hours) at 05/16/14 0907 Last data filed at 05/16/14 0815  Gross per 24 hour  Intake    740 ml  Output   3300 ml  Net  -2560 ml   Filed Weights   05/14/14 0341 05/15/14 0615 05/16/14 0535  Weight: 171 lb 4.8 oz (77.701 kg) 171 lb 15.3 oz (78 kg) 169 lb 14.4 oz  (77.066 kg)    PHYSICAL EXAM  General: Pleasant, NAD. Neuro: Alert and oriented X 3. Moves all extremities spontaneously. Psych: Normal affect. HEENT:  Normal  Neck: Supple without bruits or JVD. Lungs:  Resp regular and unlabored, CTA. Heart: RRR no s3, s4, or murmurs. Abdomen: Soft, non-tender, non-distended, BS + x 4.  Extremities: No clubbing, cyanosis or edema. DP/PT/Radials 2+ and equal bilaterally.  Accessory Clinical Findings  CBC  Recent Labs  05/14/14 0050 05/14/14 0100 05/14/14 0540  WBC 6.6  --  5.9  NEUTROABS 4.3  --   --   HGB 13.2 13.6 12.6  HCT 40.2 40.0 38.5  MCV 94.8  --  95.1  PLT 199  --  784   Basic Metabolic Panel  Recent Labs  05/14/14 0100 05/14/14 0540  NA 144 144  K 3.7 3.9  CL 105 109  CO2  --  28  GLUCOSE 127* 105*  BUN 43* 38*  CREATININE 1.10 1.10  CALCIUM  --  8.6   Liver Function Tests  Recent Labs  05/14/14 0540  AST 26  ALT 18  ALKPHOS 72  BILITOT 0.5  PROT 6.1  ALBUMIN 3.0*   Cardiac Enzymes  Recent Labs  05/14/14 0540 05/14/14 1522 05/14/14 1700  TROPONINI 0.05* 0.07* 0.09*   TELE Irregular with A.fib/A. Flutter HR 70s. Occasional spike of 100  2D echo 05/14/14 Left ventricle: Abnormal septal motion The cavity size was normal. Wall thickness was increased in a pattern of moderate LVH. Systolic function was mildly reduced. The estimated ejection fraction was in the range of 45% to 50%. Diffuse hypokinesis. - Left atrium: The atrium was mildly dilated.   Radiology/Studies  Dg Chest Port 1 View  05/14/2014   CLINICAL DATA:  Chest pain.  EXAM: PORTABLE CHEST - 1 VIEW  COMPARISON:  One-view chest x-ray 01/05/2014  FINDINGS: The heart is mildly enlarged. Aeration is improved. Mild bibasilar atelectasis is evident. There is no significant airspace consolidation. Atherosclerotic changes are noted at the arch. Mild degenerative changes are present in the [facility] joints bilaterally.  IMPRESSION: 1. Cardiomegaly  without failure. 2. Mild bibasilar airspace disease likely reflects atelectasis. 3. Improved aeration since the prior exam.   Electronically Signed   By: San Morelle M.D.   On: 05/14/2014 01:33    ASSESSMENT AND PLAN  81 y.o. W/ a long history of CAD s/p multiple stents, LBBB, chronic combined systolic/diastolic HF, PVD, HTN, HLD, tachy-brady s/p PPM (leadless), obesity, OSA on CPAP and chronic atrial fib with a CHADSVASC of at least 5 on coumadin who [facility] with chest pain.   1. Chest Pain:  - Known CAD on  plavix, nitrates, ACE and beta blocker  - Hold Coumadin for cath. Started heparin today. Aspirin 81mg  given this morning.    - Cath today. INR down to 1.85.   - Trop trend upward to 0.09. She is symptom free  2. A. Fibrillation/flutter - Continue beta blocker and Cardizem CD 180mg  - Hold coumadin for cath  - Only on plavix for CAD due to anticoagulation  3. Combined systolic and diastolic congestive heart failure:  - 2D ech 05/14/14 LV EF 45-50%. Systolic function mildly reduced. Diffuse hypokinesis. 2-D echo on 12/31/11 showed EF 60-65%.  - She is on Lasix 80mg  AM and 40mg  PM PRN. On lasix 60mg  po qd here. Continue lasix, spiro 25mg  qd, metoprolol 100mg  BID, and lisinopril 5mg  BID.   - No sign of volume overload.  - Net I/O -1962  4. LBBB:  - Old , chronic no high grade AV block   5. Chol:  - Continue statin    6. Tachycardia-bradycardia syndrome: - HR in 79s - Pacemaker  Signed, Bhagat,Bhavinkumar PA-C  Pager [phone]   Attending Note:   The patient was seen and examined.  Agree with assessment and plan as noted above.  Changes made to the above note as needed.  Patient is doing well.  For cath today.  Troponin levels are mildly elevated.     Thayer Headings, Brooke Bonito., MD, [facility] 05/16/2014, 9:48 AM [address]  [place] Pager [phone]

## 2014-05-16 NOTE — CV Procedure (Signed)
    Cardiac Catheterization Procedure Note  Name: Patty [name] MRN: [id number] DOB: 1930/11/03  Procedure: Left Heart Cath, Selective Coronary Angiography, PTCA and stenting of the proximal LAD  Indication: Unstable Angina  Procedural Details:  The right wrist was prepped, draped, and anesthetized with 1% lidocaine. Using the modified Seldinger technique, a 5/6 French Slender sheath was introduced into the right radial artery. 3 mg of verapamil was administered through the sheath, weight-based unfractionated heparin was administered intravenously. Standard Judkins catheters were used for selective coronary angiography and left ventriculography. Catheter exchanges were performed over an exchange length guidewire.  PROCEDURAL FINDINGS Hemodynamics: AO 121/60 with a mean of 81 LV 122/7   Coronary angiography: Coronary dominance: right  Left mainstem: The left mainstem is patent without obstruction. The vessel divides into the LAD and left circumflex.  Left anterior descending (LAD): The LAD has severe 90% proximal/ostial stenosis with significant plaque eccentric city. The stented segment from the proximal through the mid LAD is patent with only mild in-stent restenosis. The mid and distal LAD are patent and the vessel wraps around the LV apex. There is a well formed collateral from the apical LAD supplying a ramus intermedius branch.  Left circumflex (LCx): The left circumflex is patent. The stented segment in the proximal circumflex has mild in-stent restenosis. The first OM is occluded and fills from left to left collateral supply by the apical LAD. The second OM has 75% ostial stenosis and it is a small vessel. The left posterolateral branch is patent.  Right coronary artery (RCA): The RCA is patent. The stented segments are patent. The vessel has mild diffuse disease without high-grade stenosis. The PDA and PLA branches are patent.  Left ventriculography: Deferred  PCI Note:   Following the diagnostic procedure, the decision was made to proceed with PCI of the proximal LAD.  Weight-based bivalirudin was given for anticoagulation. Once a therapeutic ACT was achieved, a 6 [place] XB LAD 3.5 cm guide catheter was inserted.  A cougar coronary guidewire was used to cross the lesion.  The lesion was predilated with a 3.0 mm balloon.  The lesion was then stented with a 3.5 x 16 mm Synergy DES.  The stent was postdilated with a 3.75 mm noncompliant balloon.  Following PCI, there was 0% residual stenosis and TIMI-3 flow. Final angiography confirmed an excellent result. The patient tolerated the procedure well. There were no immediate procedural complications. A TR band was used for radial hemostasis. The patient was transferred to the post catheterization recovery area for further monitoring.  PCI Data: Vessel - LAD/Segment - proximal Percent Stenosis (pre)  90 TIMI-flow 3 Stent 3.5x16 mm Synergy DES Percent Stenosis (post) 0 TIMI-flow (post) 3  Estimated Blood Loss: minimal  Final Conclusions:   1. Severe proximal/ostial LAD stenosis treated successfully with PCI using a drug-eluting stent platform 2. Continued patency of the stented segments in the mid LAD, proximal circumflex, and RCA  3. Normal LVEDP 4. Chronic anticoagulation with warfarin in the setting of atrial fibrillation  Recommendations:  'Triple Rx' with ASA 81 mg, plavix 75 mg, and warfarin 1-3 months, then warfarin/plavix ongoing. Should be ok for discharge tomorrow if no complications.  Sherren Mocha MD, [facility] 05/16/2014, 5:17 PM

## 2014-05-17 ENCOUNTER — Telehealth: Payer: Self-pay | Admitting: *Deleted

## 2014-05-17 ENCOUNTER — Encounter (HOSPITAL_COMMUNITY): Payer: Medicare Other

## 2014-05-17 DIAGNOSIS — E669 Obesity, unspecified: Secondary | ICD-10-CM

## 2014-05-17 LAB — BASIC METABOLIC PANEL
ANION GAP: 9 (ref 5–15)
BUN: 26 mg/dL — ABNORMAL HIGH (ref 6–23)
CO2: 25 mmol/L (ref 19–32)
CREATININE: 1.11 mg/dL — AB (ref 0.50–1.10)
Calcium: 8.8 mg/dL (ref 8.4–10.5)
Chloride: 108 mmol/L (ref 96–112)
GFR calc Af Amer: 53 mL/min — ABNORMAL LOW (ref >90)
GFR, EST NON AFRICAN AMERICAN: 45 mL/min — AB (ref >90)
Glucose, Bld: 76 mg/dL (ref 70–99)
Potassium: 4 mmol/L (ref 3.5–5.1)
Sodium: 142 mmol/L (ref 135–145)

## 2014-05-17 LAB — POCT ACTIVATED CLOTTING TIME: Activated Clotting Time: 743 seconds

## 2014-05-17 LAB — PROTIME-INR
INR: 1.48 (ref 0.00–1.49)
PROTHROMBIN TIME: 18.1 s — AB (ref 11.6–15.2)

## 2014-05-17 MED ORDER — ASPIRIN 81 MG PO CHEW
81.0000 mg | CHEWABLE_TABLET | Freq: Every day | ORAL | Status: DC
Start: 2014-05-17 — End: 2014-08-25

## 2014-05-17 MED FILL — Sodium Chloride IV Soln 0.9%: INTRAVENOUS | Qty: 50 | Status: AC

## 2014-05-17 NOTE — Progress Notes (Signed)
Rt note:  Pt refused cpap at this time.  Rt will continue to monitor.

## 2014-05-17 NOTE — Progress Notes (Signed)
CARDIAC REHAB PHASE I   PRE:  Rate/Rhythm: 106 Afib  BP:  Supine: 154/81  Sitting:   Standing:    SaO2:   MODE:  Ambulation: 500 ft   POST:  Rate/Rhythm: 125 Afib  BP:  Supine:   Sitting: 141/77  Standing:    SaO2:  0805-0900 Assisted X 1 to ambulate with hand held assist. Gait steady. Pt able to walk 500 feet without c/o of cp or SOB. Hr after walk 125. Pt to recliner after walk with call light in reach. Reviewed stent discharge education with pt. She voices understanding. Pt is in Out[facility] in [place] currently. She is planning to keep them updated on when Dr. Irish Lack tells her she can restart program.  Rodney Langton RN 05/17/2014 8:57 AM

## 2014-05-17 NOTE — Progress Notes (Addendum)
Patient Name: Patty [name] Date of Encounter: 05/17/2014     Principal Problem:   Chest pain Active Problems:   HTN (hypertension)   Hyperlipidemia   Renal artery stenosis   Obesity   Tachycardia-bradycardia syndrome   Combined systolic and diastolic heart failure, NYHA class 3   Pacemaker   Atrial flutter, unspecified   Coronary artery disease due to lipid rich plaque   OSA on CPAP   Pain in the chest   Ischemic chest pain   Unstable angina pectoris    SUBJECTIVE  Feels better and ready to go for cath.  No chest pain, palpitation or SOB.   CURRENT MEDS . aspirin  81 mg Oral Daily  . atorvastatin  80 mg Oral q1800  . calcium-vitamin D  1 tablet Oral Q breakfast  . clopidogrel  75 mg Oral Daily  . dextromethorphan-guaiFENesin  1 tablet Oral BID  . diltiazem  180 mg Oral Daily  . furosemide  60 mg Oral Daily  . isosorbide mononitrate  60 mg Oral Daily  . lisinopril  5 mg Oral BID  . metoprolol tartrate  100 mg Oral BID  . multivitamin with minerals  1 tablet Oral q morning - 10a  . potassium chloride SA  20 mEq Oral Daily  . spironolactone  12.5 mg Oral QHS  . warfarin  1 each Does not apply Once  . Warfarin - Pharmacist Dosing Inpatient   Does not apply q1800    OBJECTIVE  Filed Vitals:   05/17/14 0400 05/17/14 0500 05/17/14 0600 05/17/14 0633  BP: 101/61 114/68 77/38 126/99  Pulse: 35 54 65   Temp:      TempSrc:      Resp: 16 16 18 24   Height:      Weight:      SpO2: 94% 93% 94%     Intake/Output Summary (Last 24 hours) at 05/17/14 0825 Last data filed at 05/16/14 2230  Gross per 24 hour  Intake  421.8 ml  Output   1250 ml  Net -828.2 ml   Filed Weights   05/15/14 0615 05/16/14 0535 05/17/14 0000  Weight: 171 lb 15.3 oz (78 kg) 169 lb 14.4 oz (77.066 kg) 167 lb 8.8 oz (76 kg)    PHYSICAL EXAM  General: Pleasant, NAD. Neuro: Alert and oriented X 3. Moves all extremities spontaneously. Psych: Normal affect. HEENT:  Normal  Neck: Supple  without bruits or JVD. Lungs:  Resp regular and unlabored, CTA. Heart: RRR no s3, s4, or murmurs. Abdomen: Soft, non-tender, non-distended, BS + x 4.  Extremities: No clubbing, cyanosis or edema. DP/PT/Radials 2+ and equal bilaterally.  Accessory Clinical Findings  CBC No results for input(s): WBC, NEUTROABS, HGB, HCT, MCV, PLT in the last 72 hours. Basic Metabolic Panel  Recent Labs  05/17/14 0316  NA 142  K 4.0  CL 108  CO2 25  GLUCOSE 76  BUN 26*  CREATININE 1.11*  CALCIUM 8.8   Liver Function Tests No results for input(s): AST, ALT, ALKPHOS, BILITOT, PROT, ALBUMIN in the last 72 hours. Cardiac Enzymes  Recent Labs  05/14/14 1522 05/14/14 1700  TROPONINI 0.07* 0.09*   TELE Irregular with A.fib/A. Flutter HR 70s. Occasional spike of 100  2D echo 05/14/14 Left ventricle: Abnormal septal motion The cavity size was normal. Wall thickness was increased in a pattern of moderate LVH. Systolic function was mildly reduced. The estimated ejection fraction was in the range of 45% to 50%. Diffuse hypokinesis. - Left atrium:  The atrium was mildly dilated.   Radiology/Studies  Dg Chest Port 1 View  05/14/2014   CLINICAL DATA:  Chest pain.  EXAM: PORTABLE CHEST - 1 VIEW  COMPARISON:  One-view chest x-ray 01/05/2014  FINDINGS: The heart is mildly enlarged. Aeration is improved. Mild bibasilar atelectasis is evident. There is no significant airspace consolidation. Atherosclerotic changes are noted at the arch. Mild degenerative changes are present in the [facility] joints bilaterally.  IMPRESSION: 1. Cardiomegaly without failure. 2. Mild bibasilar airspace disease likely reflects atelectasis. 3. Improved aeration since the prior exam.   Electronically Signed   By: San Morelle M.D.   On: 05/14/2014 01:33    ASSESSMENT AND PLAN  81 y.o. W/ a long history of CAD s/p multiple stents, LBBB, chronic combined systolic/diastolic HF, PVD, HTN, HLD, tachy-brady s/p PPM (leadless),  obesity, OSA on CPAP and chronic atrial fib with a CHADSVASC of at least 5 on coumadin who presented to [facility] on 05/14/14 with chest pain.   1. Chest Pain:  + troponins. S/p PCI of ostial LAD.  Will need ASA, plavix, coumadin for 3 months, then DC asa. Follow up with Dr. Irish Lack  2. A. Fibrillation/flutter - Continue beta blocker and Cardizem CD 180mg  Restart coumadin   3. Combined systolic and diastolic congestive heart failure:  - 2D ech 05/14/14 LV EF 45-50%. Systolic function mildly reduced. Diffuse hypokinesis. 2-D echo on 12/31/11 showed EF 60-65%.  - She is on Lasix 80mg  AM and 40mg  PM PRN. On lasix 60mg  po qd here. Continue lasix, spiro 25mg  qd, metoprolol 100mg  BID, and lisinopril 5mg  BID.   - No sign of volume overload.  - Net I/O [phone] She had some hypotension yesterday evening - I suspect it was from being NPO . BP is back up today She should be able to be restarted on her usual meds without any trouble.   4. LBBB:  - Old , chronic no high grade AV block   5. Chol:  - Continue statin    6. Tachycardia-bradycardia syndrome: - HR in 60s - Pacemaker    DC to home today   Thayer Headings, Brooke Bonito., MD, [facility] 05/17/2014, 8:28 AM [address]  [place] Pager [phone] \

## 2014-05-17 NOTE — Telephone Encounter (Signed)
Patient called and stated she in the hospital due to chest pain and had a heart cath on 05/16/14.  Per patient she has been holding her coumadin and will restart coumadin today once she is discharged from [facility].  Her appointment was rescheduled due to her restarting her coumadin today.  Patient was instructed to call in she has any questions regarding her coumadin and to report any new medications or current medication changes upon discharge. She verbalized understanding and appointment rescheduled.  H[place]ill, Lauralyn Primes, RN

## 2014-05-17 NOTE — Discharge Summary (Signed)
Discharge Summary  Patty [name] JKK:[id number] DOB: October 24, 1930  PCP: Dorian Heckle, MD  Admit date: 05/14/2014 Discharge date: 05/17/2014  Time spent: >44mins  Recommendations for Outpatient Follow-up:  1. F/u with cardiology Dr. Irish Lack in two weeks, s/p DES during this hospitalization. Resume cardiac rehab per cardiology direction. 2. Patient is to follow up with coumadin clinic on 4/20 for INR monitoring 3. F/u with Dr. [name] for pacemaker check up on 4/20 4. PMD in a month for hospital follow up.  Discharge Diagnoses:  Active Hospital Problems   Diagnosis Date Noted  . Chest pain 01/05/2014  . Unstable angina pectoris   . Ischemic chest pain   . OSA on CPAP 05/14/2014  . Pain in the chest   . Atrial flutter, unspecified   . Coronary artery disease due to lipid rich plaque   . Pacemaker   . Combined systolic and diastolic heart failure, NYHA class 3   . Tachycardia-bradycardia syndrome 09/30/2012  . HTN (hypertension) 10/03/2010  . Hyperlipidemia 10/03/2010  . Obesity 10/03/2010  . Renal artery stenosis 10/03/2010    Resolved Hospital Problems   Diagnosis Date Noted Date Resolved  No resolved problems to display.    Discharge Condition: stable  Diet recommendation: heart healthy  Filed Weights   05/15/14 0615 05/16/14 0535 05/17/14 0000  Weight: 78 kg (171 lb 15.3 oz) 77.066 kg (169 lb 14.4 oz) 76 kg (167 lb 8.8 oz)    History of present illness:  Patty [name] is a 81 y.o. female with hx CAD s/p multiple stents, HTN, HLD, Atrial fibrillation, PVD, history of granulosa cancer 20069, atrial fibrillation on Coumadin, pacemaker placement, OSA on CPAP, combined systolic and diastolic congestive heart failure, chronic left bundle blockage, who presents with chest pain.  Patent reports that she had one episode of chest pain at about 10 PM. The chest pain is pressure-like, nonradiating, located in the substernal area. It lasted for about 5 minutes, and resolved  after took one dose of nitroglycerin. One hour later, she had another episode of similar chest pain, which lasted for about 10 minutes. It was associated with lightheadedness and palpitation. No shortness of breath. Currently patient does not have chest pain. She has mild chronic cough, which has not changed.  Patient denies fever, chills, fatigue, headaches, cough, SOB, abdominal pain, diarrhea, constipation, dysuria, urgency, frequency, hematuria, skin rashes or leg swelling. No unilateral weakness, numbness or tingling sensations. No vision change or hearing loss.  In ED, patient was found to have negative troponin, INR 2.8, temperature normal, heart rate 101, chest x-ray negative for acute abnormalities. EKG showed old left bundle blockage and QT prolongation 555. Patient is admitted to inpatient for further evaluation and treatment. Cardiology was consulted by ED, will see pt in morning.   Hospital Course:  Principal Problem:   Chest pain Active Problems:   HTN (hypertension)   Hyperlipidemia   Renal artery stenosis   Obesity   Tachycardia-bradycardia syndrome   Combined systolic and diastolic heart failure, NYHA class 3   Pacemaker   Atrial flutter, unspecified   Coronary artery disease due to lipid rich plaque   OSA on CPAP   Pain in the chest   Ischemic chest pain   Unstable angina pectoris  1-chest pain: with multiple risk factors and known CAD.  -slight elevation seen on troponin -2-D echo with EF 45-50% and diffuse hypokinesis -continue plavix, ACEi, nitrates and beta blockers -cardiology on board; plan is for cath later today (4/12) with DES  to ostiall LAD -Will need ASA, plavix, coumadin for 3 months, then DC asa per cards recommendation   2-chronic atrial fibrillation/flutter: -rate controlled on b-blocker and cardizem -coumadin held and briefly on heparin drip prior to cath,  -coumadin restarted post procedure,  -f/u with coumadin clinic scheduled   3-combined  systolic and diastolic congestive heart fialure: chronic and compensated -continue ACEi and B-blocker -resume home dose of 80mg  daily -continue spironolactone 25mg  daily -daily weights, strict I's and O's and low sodium diet  4-LBBB/tachy-brady syndrome -old and unchanged -no high grade AV block on EKG or telemetry -patient s/p pacemaker implantation   5-HLD: continue statins  6-OSA: continue CPAP QHS   Code Status: DNR Procedures:  Cardiac cath 4/12 with DES  Consultations:  cardilogy  Discharge Exam: BP 154/81 mmHg  Pulse 119  Temp(Src) 98.1 F (36.7 C) (Oral)  Resp 18  Ht 5' (1.524 m)  Wt 76 kg (167 lb 8.8 oz)  BMI 32.72 kg/m2  SpO2 94%  General: AAox3, NAD Cardiovascular: irregular, rate controlled in 60's, no murmur Respiratory: CTABL Extremity: no edema  Discharge Instructions You were cared for by a hospitalist during your hospital stay. If you have any questions about your discharge medications or the care you received while you were in the hospital after you are discharged, you can call the unit and asked to speak with the hospitalist on call if the hospitalist that took care of you is not available. Once you are discharged, your primary care physician will handle any further medical issues. Please note that NO REFILLS for any discharge medications will be authorized once you are discharged, as it is imperative that you return to your primary care physician (or establish a relationship with a primary care physician if you do not have one) for your aftercare needs so that they can reassess your need for medications and monitor your lab values.  Discharge Instructions    Diet - low sodium heart healthy    Complete by:  As directed      Increase activity slowly    Complete by:  As directed             Medication List    TAKE these medications        acetaminophen 500 MG tablet  Commonly known as:  TYLENOL  Take 500 mg by mouth every 8 (eight) hours as  needed.     aspirin 81 MG chewable tablet  Chew 1 tablet (81 mg total) by mouth daily.     atorvastatin 80 MG tablet  Commonly known as:  LIPITOR  TAKE 1 TABLET (80 MG TOTAL) BY MOUTH EVERY MORNING.     CALTRATE 600+D 600-800 MG-UNIT Tabs  Generic drug:  Calcium Carb-Cholecalciferol  Take 1 tablet by mouth every morning.     clopidogrel 75 MG tablet  Commonly known as:  PLAVIX  TAKE 1 TABLET BY MOUTH EVERY DAY WITH BREAKFAST     Coenzyme Q-10 100 MG capsule  Take 200 mg by mouth daily.     diltiazem 180 MG 24 hr capsule  Commonly known as:  CARDIZEM CD  Take 1 capsule (180 mg total) by mouth daily.     FISH OIL PO  Take 1,200 mg by mouth daily.     furosemide 40 MG tablet  Commonly known as:  LASIX  Take two tablets (80mg ) every morning and 1 tablet (40mg ) every evening as needed     isosorbide mononitrate 60 MG 24 hr tablet  Commonly known as:  IMDUR  TAKE 1 TABLET BY MOUTH EVERY MORNING AND 1/2 TABLET EVERY EVENING     lisinopril 5 MG tablet  Commonly known as:  PRINIVIL,ZESTRIL  Take 5 mg by mouth 2 (two) times daily.     metoprolol 100 MG tablet  Commonly known as:  LOPRESSOR  Take 1 tablet (100 mg total) by mouth 2 (two) times daily.     multivitamin with minerals Tabs tablet  Take 1 tablet by mouth every morning.     NITROSTAT 0.4 MG SL tablet  Generic drug:  nitroGLYCERIN  PLACE 1 TABLET (0.4 MG TOTAL) UNDER THE TONGUE EVERY 5 (FIVE) MINUTES AS NEEDED. FOR CHEST PAIN     OCUVITE PRESERVISION Tabs  Take 1 tablet by mouth daily.     potassium chloride SA 20 MEQ tablet  Commonly known as:  K-DUR,KLOR-CON  Take 20 mEq by mouth daily.     spironolactone 25 MG tablet  Commonly known as:  ALDACTONE  Take 12.5 mg by mouth at bedtime.     warfarin 2 MG tablet  Commonly known as:  COUMADIN  Take as directed by Coumadin clinic       Allergies  Allergen Reactions  . Bee Venom Anaphylaxis  . Other     Pain medications cause severe vomiting. Tolerated  slow IV morphine drip       Follow-up Information    Follow up with Dorian Heckle, MD.   Specialty:  Internal Medicine   Why:  APPT. ON APRIL 20@9 ;30am   Contact information:   [address] [place] [address] [phone]       Follow up with [name]., MD In 2 weeks.   Specialty:  Interventional Cardiology   Why:  post hospital follow up, s/p DES   Contact information:   [address] [place] [address] [phone]        The results of significant diagnostics from this hospitalization (including imaging, microbiology, ancillary and laboratory) are listed below for reference.    Significant Diagnostic Studies: Dg Chest Port 1 View  05/14/2014   CLINICAL DATA:  Chest pain.  EXAM: PORTABLE CHEST - 1 VIEW  COMPARISON:  One-view chest x-ray 01/05/2014  FINDINGS: The heart is mildly enlarged. Aeration is improved. Mild bibasilar atelectasis is evident. There is no significant airspace consolidation. Atherosclerotic changes are noted at the arch. Mild degenerative changes are present in the [facility] joints bilaterally.  IMPRESSION: 1. Cardiomegaly without failure. 2. Mild bibasilar airspace disease likely reflects atelectasis. 3. Improved aeration since the prior exam.   Electronically Signed   By: San Morelle M.D.   On: 05/14/2014 01:33    Microbiology: No results found for this or any previous visit (from the past 240 hour(s)).   Labs: Basic Metabolic Panel:  Recent Labs Lab 05/14/14 0100 05/14/14 0540 05/17/14 0316  NA 144 144 142  K 3.7 3.9 4.0  CL 105 109 108  CO2  --  28 25  GLUCOSE 127* 105* 76  BUN 43* 38* 26*  CREATININE 1.10 1.10 1.11*  CALCIUM  --  8.6 8.8   Liver Function Tests:  Recent Labs Lab 05/14/14 0540  AST 26  ALT 18  ALKPHOS 72  BILITOT 0.5  PROT 6.1  ALBUMIN 3.0*   No results for input(s): LIPASE, AMYLASE in the last 168 hours. No results for input(s): AMMONIA in the last 168  hours. CBC:  Recent Labs Lab 05/14/14 0050 05/14/14  0100 05/14/14 0540  WBC 6.6  --  5.9  NEUTROABS 4.3  --   --   HGB 13.2 13.6 12.6  HCT 40.2 40.0 38.5  MCV 94.8  --  95.1  PLT 199  --  184   Cardiac Enzymes:  Recent Labs Lab 05/14/14 0540 05/14/14 1522 05/14/14 1700  TROPONINI 0.05* 0.07* 0.09*   BNP: BNP (last 3 results)  Recent Labs  05/14/14 0540  BNP 386.6*    ProBNP (last 3 results)  Recent Labs  10/04/13 1221 12/11/13 1101 01/05/14 1027  PROBNP 1963.0* 1383.0* 1341.0*    CBG:  Recent Labs Lab 05/14/14 0648 05/15/14 0619 05/16/14 0559  GLUCAP 102* 81 85       Signed:  Landra Howze MD, PhD  [facility] 05/17/2014, 10:34 AM

## 2014-05-18 ENCOUNTER — Telehealth: Payer: Self-pay | Admitting: Oncology

## 2014-05-18 NOTE — Telephone Encounter (Signed)
pt cld to r/s appt-gave pt updated time & date °

## 2014-05-19 ENCOUNTER — Telehealth (HOSPITAL_COMMUNITY): Payer: Self-pay | Admitting: Cardiac Rehabilitation

## 2014-05-19 ENCOUNTER — Other Ambulatory Visit: Payer: Self-pay | Admitting: *Deleted

## 2014-05-19 ENCOUNTER — Encounter (HOSPITAL_COMMUNITY): Payer: Medicare Other

## 2014-05-19 MED ORDER — CLOPIDOGREL BISULFATE 75 MG PO TABS: ORAL_TABLET | ORAL | Status: DC

## 2014-05-19 NOTE — Telephone Encounter (Signed)
-----   Message from [name], MD sent at 05/19/2014  1:10 PM EDT ----- Regarding: RE: cardiac rehab After a week   JV ----- Message -----    From: Lowell Guitar, RN    Sent: 05/19/2014  12:18 PM      To: [name], MD Subject: cardiac rehab                                  Dear Dr. Irish Lack,  [place] had DES LAD 05/17/14 and discharged home 05/18/14.  When should she return to cardiac rehab?  Thank you, Andi Hence, RN, BSN Cardiac Pulmonary Rehab

## 2014-05-19 NOTE — Telephone Encounter (Signed)
Pt napping, pt husband made aware of instruction to wait one week prior to returning to cardiac rehab.

## 2014-05-22 ENCOUNTER — Encounter (HOSPITAL_COMMUNITY): Payer: Medicare Other

## 2014-05-23 ENCOUNTER — Other Ambulatory Visit: Payer: Self-pay | Admitting: Interventional Cardiology

## 2014-05-24 ENCOUNTER — Ambulatory Visit (INDEPENDENT_AMBULATORY_CARE_PROVIDER_SITE_OTHER): Payer: Medicare Other | Admitting: Nurse Practitioner

## 2014-05-24 ENCOUNTER — Encounter (HOSPITAL_COMMUNITY): Payer: Medicare Other

## 2014-05-24 ENCOUNTER — Telehealth (HOSPITAL_COMMUNITY): Payer: Self-pay | Admitting: Cardiac Rehabilitation

## 2014-05-24 ENCOUNTER — Ambulatory Visit (INDEPENDENT_AMBULATORY_CARE_PROVIDER_SITE_OTHER): Payer: Medicare Other | Admitting: *Deleted

## 2014-05-24 ENCOUNTER — Encounter: Payer: Self-pay | Admitting: Nurse Practitioner

## 2014-05-24 VITALS — BP 98/48 | HR 65 | Ht 60.0 in | Wt 170.8 lb

## 2014-05-24 DIAGNOSIS — I25119 Atherosclerotic heart disease of native coronary artery with unspecified angina pectoris: Secondary | ICD-10-CM

## 2014-05-24 DIAGNOSIS — I4891 Unspecified atrial fibrillation: Secondary | ICD-10-CM

## 2014-05-24 DIAGNOSIS — I495 Sick sinus syndrome: Secondary | ICD-10-CM | POA: Diagnosis not present

## 2014-05-24 DIAGNOSIS — I4892 Unspecified atrial flutter: Secondary | ICD-10-CM | POA: Diagnosis not present

## 2014-05-24 LAB — MDC_IDC_ENUM_SESS_TYPE_INCLINIC: Implantable Pulse Generator Serial Number: 1096

## 2014-05-24 LAB — POCT INR: INR: 2.8

## 2014-05-24 MED ORDER — DILTIAZEM HCL ER COATED BEADS 120 MG PO CP24: 120.0000 mg | ORAL_CAPSULE | Freq: Every day | ORAL | Status: DC

## 2014-05-24 NOTE — Patient Instructions (Signed)
Medication Instructions:  Your physician has recommended you make the following change in your medication:  1) DECREASE CARDIZEM CD to 120 mg daily  Labwork: None  Testing/Procedures: None

## 2014-05-24 NOTE — Telephone Encounter (Signed)
-----   Message from [name], MD sent at 05/19/2014  1:10 PM EDT ----- Regarding: RE: cardiac rehab After a week   JV ----- Message -----    From: Lowell Guitar, RN    Sent: 05/19/2014  12:18 PM      To: [name], MD Subject: cardiac rehab                                  Dear Dr. Irish Lack,  [place] had DES LAD 05/17/14 and discharged home 05/18/14.  When should she return to cardiac rehab?  Thank you, Andi Hence, RN, BSN Cardiac Pulmonary Rehab

## 2014-05-24 NOTE — Progress Notes (Signed)
Electrophysiology Office Note Date: 05/24/2014  ID:  Patty [name], DOB 04/11/1930, MRN [id number]  PCP: Dorian Heckle, MD Primary Cardiologist: Irish Lack Electrophysiologist: Allred  CC: Pacemaker follow-up  Patty [name] is a 81 y.o. female is seen today for Dr [name].  She underwent leadless pacemaker implantation in February of 2014 for tachy/brady syndrome.  She was seen by Dr [name] in March of this year at which time she was V pacing at 50 virtually all of the time.  He decreased her Cardizem with planned follow up today.  After her visit with Dr [name], she developed chest pain and underwent LAD stent placement by Dr Burt Knack 05/2014.  Since recent revascularization, she reports significant improvement in her anginal symptoms.  Her heart rates have also improved into the 60's.  Device interrogation today demonstrates a reasonable histogram with V pacing 50% of the time.  Since discharge, the patient reports doing very well. She denies chest pain, palpitations, dyspnea, PND, orthopnea, nausea, vomiting, dizziness, syncope, edema.  Device History: STJ leadless PPM implanted 2014 for tachy/brady   Past Medical History  Diagnosis Date  . Hypertension   . Hyperlipidemia   . Obesity   . Diverticulitis   . Peripheral vascular disease   . Varicose veins   . Granulosa cell carcinoma     abd; last episode was in 2009  . Tachycardia-bradycardia syndrome     a. s/p leadless pacemaker ([name]) implanted by Dr [name]  . Permanent atrial fibrillation 2013  . OSA on CPAP   . Combined systolic and diastolic heart failure, NYHA class 3   . Coronary artery disease     a. s/p multiple stents  b. LHC 12/14/13 with sig CAD with TO of OM1, 70-80% occusion OM2, 50-70% mLCx, 50-70% pLAD (recent FFR 0.83), diffusely diseased RCA, widely patent stetns in pLAD and LCx.  Marland Kitchen LBBB (left bundle branch block)        . Myocardial infarction 2002  . Renal artery stenosis    Past Surgical History    Procedure Laterality Date  . Cholecystectomy  1980's  . Salivary gland surgery  2000's    "had a little lump removed; granulosa related; it was benign" (08/11/2012)  . Abdominal hysterectomy    . Colon surgery  2004    colectomy for diverticulosis  . Granulosa tumor excision  2000; 2003; 2004; 2007    "all in my abdomen including small intestines, outside my ?uterus/etc" (08/11/2012)  . Tee without cardioversion  12/31/2011    Procedure: TRANSESOPHAGEAL ECHOCARDIOGRAM (TEE);  Surgeon: [name], MD;  Location: [facility];  Service: Cardiovascular;  Laterality: N/A;  . Cardioversion  12/31/2011    Procedure: CARDIOVERSION;  Surgeon: [name], MD;  Location: [facility];  Service: Cardiovascular;  Laterality: N/A;  . Cardiac catheterization  09/03/2007    EF 70%; Failed attempt at PCI to OM  . Cardiac catheterization  11/01/2003    EF 70%  . Coronary angioplasty with stent placement  2000; 08/11/2012; 11/12/2012    3 + 2 LAD & CFX; 2nd CFX stent 11/12/2012  . Varicose vein surgery Bilateral 1977  . Hernia repair  2005    "laparoscopic"  . Cataract extraction, bilateral  2015  . Cardioversion N/A 12/31/2011    Procedure: CARDIOVERSION;  Surgeon: [name], MD;  Location: [facility] CATH LAB;  Service: Cardiovascular;  Laterality: N/A;  . Permanent pacemaker insertion N/A 03/16/2012    [name] (SJM) leadless pacemaker (LEADLESS II STUDY PATEINT)  .  Percutaneous coronary intervention-balloon only  08/04/2012    Procedure: PERCUTANEOUS CORONARY INTERVENTION-BALLOON ONLY;  Surgeon: [name], MD;  Location: [facility] CATH LAB;  Service: Cardiovascular;;  . Percutaneous coronary rotoblator intervention (pci-r) N/A 08/11/2012    Procedure: PERCUTANEOUS CORONARY ROTOBLATOR INTERVENTION (PCI-R);  Surgeon: [name], MD;  Location: [facility] CATH LAB;  Service: Cardiovascular;  Laterality: N/A;  . Left heart catheterization with coronary angiogram N/A 11/12/2012     Procedure: LEFT HEART CATHETERIZATION WITH CORONARY ANGIOGRAM;  Surgeon: [name], MD;  Location: [facility] CATH LAB;  Service: Cardiovascular;  Laterality: N/A;  . Left heart catheterization with coronary angiogram N/A 10/07/2013    Procedure: LEFT HEART CATHETERIZATION WITH CORONARY ANGIOGRAM;  Surgeon: [name], MD;  Location: [facility] CATH LAB;  Service: Cardiovascular;  Laterality: N/A;  . Percutaneous coronary stent intervention (pci-s)  10/07/2013    Procedure: PERCUTANEOUS CORONARY STENT INTERVENTION (PCI-S);  Surgeon: [name], MD;  Location: [facility] CATH LAB;  Service: Cardiovascular;;  . Fractional flow reserve wire  10/07/2013    Procedure: FRACTIONAL FLOW RESERVE WIRE;  Surgeon: [name], MD;  Location: [facility] CATH LAB;  Service: Cardiovascular;;  . Left heart catheterization with coronary angiogram N/A 12/14/2013    Procedure: LEFT HEART CATHETERIZATION WITH CORONARY ANGIOGRAM;  Surgeon: Sinclair Grooms, MD;  Location: [facility] CATH LAB;  Service: Cardiovascular;  Laterality: N/A;  . Left heart catheterization with coronary angiogram N/A 05/16/2014    Procedure: LEFT HEART CATHETERIZATION WITH CORONARY ANGIOGRAM;  Surgeon: Sherren Mocha, MD;  Location: [facility] CATH LAB;  Service: Cardiovascular;  Laterality: N/A;    Current Outpatient Prescriptions  Medication Sig Dispense Refill  . aspirin 81 MG chewable tablet Chew 1 tablet (81 mg total) by mouth daily. 30 tablet 3  . atorvastatin (LIPITOR) 80 MG tablet TAKE 1 TABLET (80 MG TOTAL) BY MOUTH EVERY MORNING. 30 tablet 5  . Calcium Carb-Cholecalciferol (CALTRATE 600+D) 600-800 MG-UNIT TABS Take 1 tablet by mouth every morning.     . clopidogrel (PLAVIX) 75 MG tablet TAKE 1 TABLET BY MOUTH EVERY DAY WITH BREAKFAST 30 tablet 3  . Coenzyme Q-10 100 MG capsule Take 200 mg by mouth daily.     Marland Kitchen diltiazem (CARDIZEM CD) 120 MG 24 hr capsule Take 1 capsule (120 mg total) by mouth daily. 30 capsule 6  . furosemide (LASIX) 40 MG tablet  TAKE TWO TABLETS (80MG ) EVERY MORNING AND 1 TABLET (40MG ) EVERY EVENING AS NEEDED 90 tablet 0  . isosorbide mononitrate (IMDUR) 60 MG 24 hr tablet TAKE 1 TABLET BY MOUTH EVERY MORNING AND 1/2 TABLET EVERY EVENING 45 tablet 6  . lisinopril (PRINIVIL,ZESTRIL) 5 MG tablet Take 5 mg by mouth 2 (two) times daily.    . metoprolol tartrate (LOPRESSOR) 100 MG tablet Take 1 tablet (100 mg total) by mouth 2 (two) times daily. 60 tablet 11  . Multiple Vitamin (MULTIVITAMIN WITH MINERALS) TABS tablet Take 1 tablet by mouth every morning.    . Multiple Vitamins-Minerals (OCUVITE PRESERVISION) TABS Take 1 tablet by mouth daily.     Marland Kitchen NITROSTAT 0.4 MG SL tablet PLACE 1 TABLET (0.4 MG TOTAL) UNDER THE TONGUE EVERY 5 (FIVE) MINUTES AS NEEDED. FOR CHEST PAIN 25 tablet 3  . Omega-3 Fatty Acids (FISH OIL PO) Take 1,200 mg by mouth daily.     . potassium chloride SA (K-DUR,KLOR-CON) 20 MEQ tablet Take 20 mEq by mouth daily.    Marland Kitchen spironolactone (ALDACTONE) 25 MG tablet Take 12.5 mg by mouth at  bedtime.     Marland Kitchen warfarin (COUMADIN) 2 MG tablet Take as directed by Coumadin clinic (Patient taking differently: Take 1-2 mg by mouth daily. Take 2mg  daily except 1mg  on Tuesday and Friday) 30 tablet 3   No current facility-administered medications for this visit.    Allergies:   Bee venom and Other   Social History: History   Social History  . Marital Status: Married    Spouse Name: N/A  . Number of Children: N/A  . Years of Education: N/A   Occupational History  . Retired    Social History Main Topics  . Smoking status: Former Smoker -- 1.00 packs/day for 32 years    Types: Cigarettes    Quit date: 02/03/1974  . Smokeless tobacco: Never Used  . Alcohol Use: 3.0 oz/week    5 Glasses of wine per week     Comment: 11/12/2012 "4oz wine w/dinner 5 days/wk"  . Drug Use: No  . Sexual Activity: No   Other Topics Concern  . Not on file   Social History Narrative   Lives with family.    Family History: Family  History  Problem Relation Age of Onset  . Heart attack Father   . Heart attack Brother   . Stroke Mother   . Diabetes Brother   . Diabetes Father   . Hypertension Brother   . Hypertension Father   . Kidney failure Brother      Review of Systems: All other systems reviewed and are otherwise negative except as noted above.   Physical Exam: VS:  BP 98/48 mmHg  Pulse 65  Ht 5' (1.524 m)  Wt 170 lb 12.8 oz (77.474 kg)  BMI 33.36 kg/m2 , BMI Body mass index is 33.36 kg/(m^2).  GEN- The patient is well appearing, alert and oriented x 3 today.   HEENT: normocephalic, atraumatic; sclera clear, conjunctiva pink; hearing intact; oropharynx clear; neck supple Lungs- Clear to ausculation bilaterally, normal work of breathing.  No wheezes, rales, rhonchi Heart- Regular rate and rhythm GI- soft, non-tender, non-distended, bowel sounds present Extremities- no clubbing, cyanosis, or edema; DP/PT/radial pulses 2+ bilaterally; no right wrist pulsatile mass, ecchymosis MS- no significant deformity or atrophy Skin- warm and dry, scattered bruises Psych- euthymic mood, full affect Neuro- strength and sensation are intact  PPM Interrogation- reviewed in detail today,  See PACEART report  EKG:  EKG is ordered today. The ekg ordered today shows atrial flutter, ventricular rate 65, LBBB  Recent Labs: 01/05/2014: Pro B Natriuretic peptide (BNP) 1341.0* 01/13/2014: TSH 4.65* 05/14/2014: ALT 18; B Natriuretic Peptide 386.6*; Hemoglobin 12.6; Platelets 184 05/17/2014: BUN 26*; Creatinine 1.11*; Potassium 4.0; Sodium 142   Wt Readings from Last 3 Encounters:  05/24/14 170 lb 12.8 oz (77.474 kg)  05/17/14 167 lb 8.8 oz (76 kg)  04/10/14 173 lb 3.2 oz (78.563 kg)     Other studies Reviewed: Additional studies/ records that were reviewed today include: hospital records, Dr Jackalyn Lombard office notes  Assessment and Plan:  1.  Permanent atrial fibrillation Continue Warfarin for CHADS2VASC score of at  least 5 Rates are reasonable, reduce Cardizem to 120mg  daily Will not turn on rate response at this time, she is monitoring heart rates and feels that they are significantly improved with exertion Normal PPM function - V pacing 50% of the time See Claudia Desanctis Art report  2.  CAD Doing well s/p recent LAD stent Right wrist without complication She has had no further angina Continue medical therapy  Per Dr  Cooper, will need triple therapy with ASA, Plavix, Warfarin for 1-3 months then ongoing Warfarin/Plavix.  She is not currently having any bleeding complications.  Advised to monitor.   3.  HTN Stable No change required today   Current medicines are reviewed at length with the patient today.   The patient does not have concerns regarding her medicines.  The following changes were made today:  Decrease Cardizem to 120mg  daily today  Labs/ tests ordered today include:  Orders Placed This Encounter  Procedures  . EKG 12-Lead     Disposition:   Follow up with Richardson Dopp as scheduled 06/2014; follow up with Dr [name] 07/2014 as scheduled.    Signed, Chanetta [facility], NP 05/24/2014 4:19 PM  [place] [address] [phone] (office) [phone] (fax)

## 2014-05-25 ENCOUNTER — Other Ambulatory Visit: Payer: Self-pay | Admitting: Interventional Cardiology

## 2014-05-26 ENCOUNTER — Telehealth: Payer: Self-pay | Admitting: Interventional Cardiology

## 2014-05-26 ENCOUNTER — Encounter (HOSPITAL_COMMUNITY)
Admission: RE | Admit: 2014-05-26 | Discharge: 2014-05-26 | Disposition: A | Discharge status: Home / Self Care | Payer: Medicare Other | Source: Ambulatory Visit | Attending: Interventional Cardiology | Admitting: Interventional Cardiology

## 2014-05-26 DIAGNOSIS — Z955 Presence of coronary angioplasty implant and graft: Secondary | ICD-10-CM | POA: Diagnosis not present

## 2014-05-26 NOTE — Telephone Encounter (Signed)
Patty [name] from Cardiac Rehab calling stating Patty [name] restarted Cardiac Rehab today after her stent.  States HR at rest and seated was 90-100; walking 120/130.  Was seen by Corrin Parker on 4/20 and her Cardiazem was decreased to 120 mg daily.  Patty [name] states HR prior to stent was 40-50's and now 90-100's.  Spoke w/Amber Seiler,NP who states that is acceptable HR and not to change any treatment.  Notified Patty [name] at Cardiac Rehab.

## 2014-05-26 NOTE — Telephone Encounter (Signed)
RE: Cardiac Rehab  Received: Today    Lowell Guitar, RN  Loren Racer, LPN           Yes she can return today, if she feels ready.   Thank you so much for asking!  [name]     Spoke with pt and informed her that  [name] from cardiac rehab said that she could return today if she feels ready. Pt said she does feel ready and plans to attend.

## 2014-05-26 NOTE — Telephone Encounter (Signed)
Spoke with pt and she states that Dr. Irish Lack had told Patty [name] at cardiac rehab that she could return to rehab in a week. Pt wanted to a letter sent over saying that she had permission. Informed pt that I see where Dr. Irish Lack said she could return in a week. Informed pt that I would contact [name] at [facility] and verify that she can come in today for Rehab and I would call her back. Pt verbalized understanding and was in agreement with this plan. Pt states that she plans to [name] out of town after Rehab today so when I call back I can tell her husband if she is not available.

## 2014-05-26 NOTE — Progress Notes (Signed)
Pt returned to cardiac rehab following LAD stent by Dr. Burt Knack. Pt tolerated light activity without difficulty. Asymptomatic.  Pt does report medication dose change of decreased diltiazem to 120mg  daily.  Telemetry-atrial fibrillation, bundle branch block, HR-90-100's at rest and seated exercises, 120-130's with walking exercise.  Dr. Remo Lipps triage nurse made aware, who reviewed with Chanetta Marshall, NP.  Heart rates appropriate, no change in current regimen.  Will continue to monitor.

## 2014-05-26 NOTE — Telephone Encounter (Signed)
New problem    Pt's heart rate is 90-100 at rest and 120-130 with exercise.

## 2014-05-26 NOTE — Telephone Encounter (Signed)
New message       Pt needs a letter faxed over to cardiac rehab stating she can restart rehab today   please give pt a call

## 2014-05-29 ENCOUNTER — Encounter (HOSPITAL_COMMUNITY)
Admission: RE | Admit: 2014-05-29 | Discharge: 2014-05-29 | Disposition: A | Discharge status: Home / Self Care | Payer: Medicare Other | Source: Ambulatory Visit | Attending: Interventional Cardiology | Admitting: Interventional Cardiology

## 2014-05-29 DIAGNOSIS — Z955 Presence of coronary angioplasty implant and graft: Secondary | ICD-10-CM | POA: Diagnosis not present

## 2014-05-31 ENCOUNTER — Encounter (HOSPITAL_COMMUNITY)
Admission: RE | Admit: 2014-05-31 | Discharge: 2014-05-31 | Disposition: A | Discharge status: Home / Self Care | Payer: Medicare Other | Source: Ambulatory Visit | Attending: Interventional Cardiology | Admitting: Interventional Cardiology

## 2014-05-31 ENCOUNTER — Other Ambulatory Visit: Payer: Self-pay

## 2014-05-31 DIAGNOSIS — Z955 Presence of coronary angioplasty implant and graft: Secondary | ICD-10-CM | POA: Diagnosis not present

## 2014-05-31 MED ORDER — LISINOPRIL 5 MG PO TABS: 5.0000 mg | ORAL_TABLET | Freq: Two times a day (BID) | ORAL | Status: DC

## 2014-06-02 ENCOUNTER — Encounter (HOSPITAL_COMMUNITY): Payer: Medicare Other

## 2014-06-03 ENCOUNTER — Other Ambulatory Visit: Payer: Self-pay | Admitting: Interventional Cardiology

## 2014-06-05 ENCOUNTER — Encounter (HOSPITAL_COMMUNITY): Payer: Medicare Other

## 2014-06-06 DIAGNOSIS — I10 Essential (primary) hypertension: Secondary | ICD-10-CM | POA: Diagnosis not present

## 2014-06-06 DIAGNOSIS — I2510 Atherosclerotic heart disease of native coronary artery without angina pectoris: Secondary | ICD-10-CM | POA: Diagnosis not present

## 2014-06-06 DIAGNOSIS — Z0000 Encounter for general adult medical examination without abnormal findings: Secondary | ICD-10-CM | POA: Diagnosis not present

## 2014-06-06 DIAGNOSIS — Z13820 Encounter for screening for osteoporosis: Secondary | ICD-10-CM | POA: Diagnosis not present

## 2014-06-06 DIAGNOSIS — I481 Persistent atrial fibrillation: Secondary | ICD-10-CM | POA: Diagnosis not present

## 2014-06-06 DIAGNOSIS — I252 Old myocardial infarction: Secondary | ICD-10-CM | POA: Diagnosis not present

## 2014-06-06 DIAGNOSIS — Z1389 Encounter for screening for other disorder: Secondary | ICD-10-CM | POA: Diagnosis not present

## 2014-06-07 ENCOUNTER — Encounter (HOSPITAL_COMMUNITY)
Admission: RE | Admit: 2014-06-07 | Discharge: 2014-06-07 | Disposition: A | Discharge status: Home / Self Care | Payer: Medicare Other | Source: Ambulatory Visit | Attending: Interventional Cardiology | Admitting: Interventional Cardiology

## 2014-06-07 DIAGNOSIS — Z955 Presence of coronary angioplasty implant and graft: Secondary | ICD-10-CM | POA: Insufficient documentation

## 2014-06-09 ENCOUNTER — Encounter: Payer: Self-pay | Admitting: Interventional Cardiology

## 2014-06-09 ENCOUNTER — Encounter (HOSPITAL_COMMUNITY): Payer: Self-pay

## 2014-06-09 ENCOUNTER — Encounter (HOSPITAL_COMMUNITY)
Admission: RE | Admit: 2014-06-09 | Discharge: 2014-06-09 | Disposition: A | Discharge status: Home / Self Care | Payer: Medicare Other | Source: Ambulatory Visit | Attending: Interventional Cardiology | Admitting: Interventional Cardiology

## 2014-06-09 DIAGNOSIS — Z955 Presence of coronary angioplasty implant and graft: Secondary | ICD-10-CM | POA: Diagnosis not present

## 2014-06-09 NOTE — Progress Notes (Signed)
Pt graduated from cardiac rehab program today with completion of 36 exercise sessions in Phase II. Pt maintained good attendance and progressed nicely during her participation in rehab as evidenced by increased MET level.   Medication list reconciled. Repeat psychosocial assessment:   Overall pt has positive attitude towards life and her illness with good coping skills, hopeful outlook and strong faith base.  Pt is very pleased with her progress thoughout cardiac rehab and feels much better since her most recent cardiac intervention. Pt reports she has taken one NTG SL with relief at home during a time of packing for [place] trip.   PHQ score-0.   Pt feels she has achieved her goals during cardiac rehab, specifically her goal of feeling better. During the next month, pt plans to walk, swim and attend silver sneakers.  After this,  Pt plans to continue exercise in cardiac maintenance program.  Pt realizes need of climate controlled exercise setting in summer months. Pt is committed to continue working towards her weight loss goal.   Pt has made significant lifestyle changes and should be commended for her success.

## 2014-06-12 ENCOUNTER — Encounter (HOSPITAL_COMMUNITY): Payer: Medicare Other

## 2014-06-13 ENCOUNTER — Other Ambulatory Visit: Payer: Self-pay | Admitting: Oncology

## 2014-06-13 ENCOUNTER — Other Ambulatory Visit (HOSPITAL_BASED_OUTPATIENT_CLINIC_OR_DEPARTMENT_OTHER): Payer: Medicare Other

## 2014-06-13 ENCOUNTER — Ambulatory Visit (HOSPITAL_BASED_OUTPATIENT_CLINIC_OR_DEPARTMENT_OTHER): Payer: Medicare Other | Admitting: Oncology

## 2014-06-13 ENCOUNTER — Telehealth: Payer: Self-pay | Admitting: Oncology

## 2014-06-13 ENCOUNTER — Encounter: Payer: Self-pay | Admitting: Oncology

## 2014-06-13 VITALS — BP 102/58 | HR 53 | Temp 98.0°F | Resp 18 | Ht 60.0 in | Wt 173.0 lb

## 2014-06-13 DIAGNOSIS — C569 Malignant neoplasm of unspecified ovary: Secondary | ICD-10-CM

## 2014-06-13 DIAGNOSIS — L729 Follicular cyst of the skin and subcutaneous tissue, unspecified: Secondary | ICD-10-CM | POA: Diagnosis not present

## 2014-06-13 DIAGNOSIS — D3910 Neoplasm of uncertain behavior of unspecified ovary: Secondary | ICD-10-CM

## 2014-06-13 LAB — COMPREHENSIVE METABOLIC PANEL (CC13)
ALK PHOS: 90 U/L (ref 40–150)
ALT: 20 U/L (ref 0–55)
AST: 27 U/L (ref 5–34)
Albumin: 3.4 g/dL — ABNORMAL LOW (ref 3.5–5.0)
Anion Gap: 10 mEq/L (ref 3–11)
BUN: 29.4 mg/dL — AB (ref 7.0–26.0)
CALCIUM: 8.6 mg/dL (ref 8.4–10.4)
CO2: 29 meq/L (ref 22–29)
Chloride: 103 mEq/L (ref 98–109)
Creatinine: 1.3 mg/dL — ABNORMAL HIGH (ref 0.6–1.1)
EGFR: 39 mL/min/{1.73_m2} — AB (ref >90)
Glucose: 114 mg/dl (ref 70–140)
Potassium: 4.1 mEq/L (ref 3.5–5.1)
Sodium: 142 mEq/L (ref 136–145)
Total Bilirubin: 0.81 mg/dL (ref 0.20–1.20)
Total Protein: 6.4 g/dL (ref 6.4–8.3)

## 2014-06-13 LAB — CBC WITH DIFFERENTIAL/PLATELET
BASO%: 0.3 % (ref 0.0–2.0)
BASOS ABS: 0 10*3/uL (ref 0.0–0.1)
EOS%: 5.2 % (ref 0.0–7.0)
Eosinophils Absolute: 0.3 10*3/uL (ref 0.0–0.5)
HCT: 39 % (ref 34.8–46.6)
HGB: 12.9 g/dL (ref 11.6–15.9)
LYMPH%: 20.9 % (ref 14.0–49.7)
MCH: 32 pg (ref 25.1–34.0)
MCHC: 33.1 g/dL (ref 31.5–36.0)
MCV: 96.8 fL (ref 79.5–101.0)
MONO#: 0.5 10*3/uL (ref 0.1–0.9)
MONO%: 7.6 % (ref 0.0–14.0)
NEUT%: 66 % (ref 38.4–76.8)
NEUTROS ABS: 4.1 10*3/uL (ref 1.5–6.5)
PLATELETS: 196 10*3/uL (ref 145–400)
RBC: 4.03 10*6/uL (ref 3.70–5.45)
RDW: 14.3 % (ref 11.2–14.5)
WBC: 6.2 10*3/uL (ref 3.9–10.3)
lymph#: 1.3 10*3/uL (ref 0.9–3.3)

## 2014-06-13 NOTE — Progress Notes (Signed)
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 This visit was delayed because of acute cardiology admission 4-10 thru 05-17-14. Last CT AP was 11-2011. PCP now is Dr Dorian Heckle, who also sees her husband.  Patient has had several cardiology related hospitalizations in last year, tho had done well from Jan thru March 2016. She has felt well again since the April hospitalization, completed cardiac rehab program last week. She is on chronic anticoagulation for cardiology indications, no overt bleeding.  She noticed a nontender area right mid abdominal wall in past several weeks, which has not changed during this time. She has history of "cysts" in various locations. Otherwise she has noticed no abdominal or pelvic discomfort, bowels are moving regularly, no bleeding, no change in bladder, no LE swelling.   No central catheter   ONCOLOGIC HISTORY The granulosa cell tumor was initially diagnosed at [facility] in 2000, with recurrences in 2004, 2007 and spring 2011. The original operative note from 2000 and that pathology did not transfer into present EMR and I do not know laterality of the original tumor. The surgery in 2004 included resection of the tumor with small bowel resection and colectomy for diverticulosis. She had laparoscopic repair of ventral hernia in 2005. In 2007 she had resection of pelvic recurrence of tumor plus open repair of ventral hernia with mesh. Most recent intervention was laparoscopic resection of suprapubic involvement by Dr.Hoxworth in May 2011; she had a small and asymptomatic suprapubic hernia in 2011 which has been followed. All of her disease has been very indolent and has generally been detected by CT scans. Last CT AP  was April 2013; [name], self-limited episode of bowel obstruction in May 2012.      Review of systems as above, also: Breathing comfortable at present and no chest pain. Appetite good, no N/V. No recent fever or symptoms of infection. Remainder of 10 point Review of Systems negative.  Objective:  Vital signs in last 24 hours:  BP 102/58 mmHg  Pulse 53  Temp(Src) 98 F (36.7 C) (Oral)  Resp 18  Ht 5' (1.524 m)  Wt 173 lb (78.472 kg)  BMI 33.79 kg/m2  Alert, oriented and appropriate. Ambulatory without assistance difficulty.  Alopecia  HEENT:PERRL, sclerae not icteric. Oral mucosa moist without lesions, posterior pharynx clear.  Neck supple. No JVD.  Lymphatics:no cervical,supraclavicular, axillary or inguinal adenopathy Resp: clear to auscultation bilaterally and normal percussion bilaterally Cardio: regular rate and rhythm. No gallop. GI: abdomen obese, soft, nontender. Normally active bowel sounds. Surgical incision not remarkable. RIght mid abdominal wall anteriorly has ~ 2 x 3 cm smoothly rounded SQ mass, not firm, consistent with cyst. Musculoskeletal/ Extremities: without pitting edema, cords, tenderness.  2 similar SQ cystic areas at medial left elbow and left forearm. Neuro: no focal deficits. Psych appropriate mood and affect Skin without rash, ecchymosis, petechiae   Lab Results:  Results for orders placed or performed in visit on 06/13/14  CBC with Differential  Result Value Ref Range   WBC 6.2 3.9 - 10.3 10e3/uL   NEUT# 4.1 1.5 - 6.5 10e3/uL   HGB 12.9 11.6 - 15.9 g/dL   HCT 39.0 34.8 -  46.6 %   Platelets 196 145 - 400 10e3/uL   MCV 96.8 79.5 - 101.0 fL   MCH 32.0 25.1 - 34.0 pg   MCHC 33.1 31.5 - 36.0 g/dL   RBC 4.03 3.70 - 5.45 10e6/uL   RDW 14.3 11.2 - 14.5 %   lymph# 1.3 0.9 - 3.3 10e3/uL   MONO# 0.5 0.1 - 0.9 10e3/uL   Eosinophils Absolute 0.3 0.0 - 0.5 10e3/uL    Basophils Absolute 0.0 0.0 - 0.1 10e3/uL   NEUT% 66.0 38.4 - 76.8 %   LYMPH% 20.9 14.0 - 49.7 %   MONO% 7.6 0.0 - 14.0 %   EOS% 5.2 0.0 - 7.0 %   BASO% 0.3 0.0 - 2.0 %  Comprehensive metabolic panel (Cmet) - [facility]  Result Value Ref Range   Sodium 142 136 - 145 mEq/L   Potassium 4.1 3.5 - 5.1 mEq/L   Chloride 103 98 - 109 mEq/L   CO2 29 22 - 29 mEq/L   Glucose 114 70 - 140 mg/dl   BUN 29.4 (H) 7.0 - 26.0 mg/dL   Creatinine 1.3 (H) 0.6 - 1.1 mg/dL   Total Bilirubin 0.81 0.20 - 1.20 mg/dL   Alkaline Phosphatase 90 40 - 150 U/L   AST 27 5 - 34 U/L   ALT 20 0 - 55 U/L   Total Protein 6.4 6.4 - 8.3 g/dL   Albumin 3.4 (L) 3.5 - 5.0 g/dL   Calcium 8.6 8.4 - 10.4 mg/dL   Anion Gap 10 3 - 11 mEq/L   EGFR 39 (L) >90 ml/min/1.73 m2     Studies/Results:  EXAM: PORTABLE CHEST - 1 VIEW  05-14-14  COMPARISON: One-view chest x-ray 01/05/2014  FINDINGS: The heart is mildly enlarged. Aeration is improved. Mild bibasilar atelectasis is evident. There is no significant airspace consolidation. Atherosclerotic changes are noted at the arch. Mild degenerative changes are present in the [facility] joints bilaterally.  IMPRESSION: 1. Cardiomegaly without failure. 2. Mild bibasilar airspace disease likely reflects atelectasis. 3. Improved aeration since the prior exam.   Medications: I have reviewed the patient's current medications.  DISCUSSION: Reassured patient that the area felt on lateral abdomen seems to be a benign SQ cyst, similar to other chronic areas on extremities. I do not think she needs other imaging or interventions now. She seems clinically stable from standpoint of the granulosa cell tumor and we do not plan regular imaging, tho would consider this if concerns. Obviously the cardiac problems continue to be the priority.  Assessment/Plan:  1.granulosa cell tumor of ovary: several local recurrences since 2004 treated surgically, very indolent. She has never needed systemic  treatment. Patient is most comfortable with me seeing her again in 6 months, and knows that she can call prior if needed. 2.SQ cyst right anterior abdominal wall: clinically not of concern. She will let me know if any changes prior to 6 mo follow up 3.cardiac disease: CAD with stents, pacer, Afib/ tachy brady, LBBB, hx MI, on anticoagulation. Followed closely by cardiologists and she is most appreciative of care 4.sleep apnea on CPAP 5.HTN, elevated lipids, PVD 6.obesity  All questions answered and patient is comfortable with recommendations and plans. Time spent 20 min including >50% counseling and coordination of care. CC Dr Michail Sermon, Dr Salli Quarry, MD   06/13/2014, 9:00 AM

## 2014-06-13 NOTE — Telephone Encounter (Signed)
Appointments made and avs  Printed for patient

## 2014-06-14 ENCOUNTER — Encounter (HOSPITAL_COMMUNITY): Payer: Medicare Other

## 2014-06-14 ENCOUNTER — Encounter: Payer: Medicare Other | Admitting: Physician Assistant

## 2014-06-14 NOTE — Progress Notes (Signed)
Cardiology Office Note   Date:  06/14/2014   ID:  Patty [name], DOB Jun 10, 1930, MRN [id number]  PCP:  Dorian Heckle, MD  Cardiologist:  Dr. Casandra Doffing   Electrophysiologist:  Dr. Thompson Grayer   Chief Complaint  Patient presents with  . Coronary Artery Disease  . Atrial Fibrillation  . Congestive Heart Failure  . Hospitalization Follow-up     History of Present Illness: Patty [name] is a 81 y.o. female with a hx of CAD with prior multiple PCI to LAD/LCx/RCA, combined systolic and diastolic CHF, HTN, HL, atrial fibrillation, tachybradycardia syndrome status post leadless pacemaker, PAD, OSA on CPAP, LBBB.    Admitted 4/10-4/13 with unstable angina and mildly elevated troponins. LHC demonstrated significant proximal/ostial LAD stenosis which was treated with a synergy DES. Circumflex RCA stents are patent. EF 45-50% by echocardiogram. Given need for anticoagulation for atrial fibrillation, it was recommended she remain on triple therapy with aspirin, Plavix and warfarin for 1-3 months. She within the warfarin/Plavix.     Studies/Reports Reviewed Today:  LHC/PCI 05/16/14 LM: without obstruction.  LAD: severe 90% proximal/ostial stenosis, stented segment proximal through mid LAD patent with mild ISR. Well formed collateral from the apical LAD supplying a ramus intermedius branch. LCx: Stented segment proximal circumflex has mild in-stent restenosis. OM1 occluded and fills from L-L collateral supply by the apical LAD. OM2 75% ostial stenosis and it is a small vessel. The left posterolateral branch is patent. RCA:The stented segments are patent. The vessel has mild diffuse disease without high-grade stenosis. The PDA and PLA branches are patent. PCI Data:  Vessel - LAD/Segment - proximal Stent 3.5x16 mm Synergy DES Recommendations: 'Triple Rx' with ASA 81 mg, plavix 75 mg, and warfarin 1-3 months, then warfarin/plavix ongoing.   Echocardiogram 05/14/14 Study Conclusions -  Moderate LVH. EF 45% to 50%. Diffuse hypokinesis. - Left atrium: The atrium was mildly dilated.   Past Medical History  Diagnosis Date  . Hypertension   . Hyperlipidemia   . Obesity   . Diverticulitis   . Peripheral vascular disease   . Varicose veins   . Granulosa cell carcinoma     abd; last episode was in 2009  . Tachycardia-bradycardia syndrome     a. s/p leadless pacemaker ([name]) implanted by Dr [name]  . Permanent atrial fibrillation 2013  . OSA on CPAP   . Combined systolic and diastolic heart failure, NYHA class 3   . Coronary artery disease     a. s/p multiple stents  b. LHC 12/14/13 with sig CAD with TO of OM1, 70-80% occusion OM2, 50-70% mLCx, 50-70% pLAD (recent FFR 0.83), diffusely diseased RCA, widely patent stetns in pLAD and LCx.  Marland Kitchen LBBB (left bundle branch block)        . Myocardial infarction 2002  . Renal artery stenosis     Past Surgical History  Procedure Laterality Date  . Cholecystectomy  1980's  . Salivary gland surgery  2000's    "had a little lump removed; granulosa related; it was benign" (08/11/2012)  . Abdominal hysterectomy    . Colon surgery  2004    colectomy for diverticulosis  . Granulosa tumor excision  2000; 2003; 2004; 2007    "all in my abdomen including small intestines, outside my ?uterus/etc" (08/11/2012)  . Tee without cardioversion  12/31/2011    Procedure: TRANSESOPHAGEAL ECHOCARDIOGRAM (TEE);  Surgeon: [name], MD;  Location: [place];  Service: Cardiovascular;  Laterality: N/A;  . Cardioversion  12/31/2011  Procedure: CARDIOVERSION;  Surgeon: [name], MD;  Location: [place];  Service: Cardiovascular;  Laterality: N/A;  . Cardiac catheterization  09/03/2007    EF 70%; Failed attempt at PCI to OM  . Cardiac catheterization  11/01/2003    EF 70%  . Coronary angioplasty with stent placement  2000; 08/11/2012; 11/12/2012    3 + 2 LAD & CFX; 2nd CFX stent 11/12/2012  . Varicose vein surgery  Bilateral 1977  . Hernia repair  2005    "laparoscopic"  . Cataract extraction, bilateral  2015  . Cardioversion N/A 12/31/2011    Procedure: CARDIOVERSION;  Surgeon: [name], MD;  Location: [facility] CATH LAB;  Service: Cardiovascular;  Laterality: N/A;  . Permanent pacemaker insertion N/A 03/16/2012    [name] (SJM) leadless pacemaker (LEADLESS II STUDY PATEINT)  . Percutaneous coronary intervention-balloon only  08/04/2012    Procedure: PERCUTANEOUS CORONARY INTERVENTION-BALLOON ONLY;  Surgeon: [name], MD;  Location: [facility] CATH LAB;  Service: Cardiovascular;;  . Percutaneous coronary rotoblator intervention (pci-r) N/A 08/11/2012    Procedure: PERCUTANEOUS CORONARY ROTOBLATOR INTERVENTION (PCI-R);  Surgeon: [name], MD;  Location: [facility] CATH LAB;  Service: Cardiovascular;  Laterality: N/A;  . Left heart catheterization with coronary angiogram N/A 11/12/2012    Procedure: LEFT HEART CATHETERIZATION WITH CORONARY ANGIOGRAM;  Surgeon: [name], MD;  Location: [facility] CATH LAB;  Service: Cardiovascular;  Laterality: N/A;  . Left heart catheterization with coronary angiogram N/A 10/07/2013    Procedure: LEFT HEART CATHETERIZATION WITH CORONARY ANGIOGRAM;  Surgeon: [name], MD;  Location: [facility] CATH LAB;  Service: Cardiovascular;  Laterality: N/A;  . Percutaneous coronary stent intervention (pci-s)  10/07/2013    Procedure: PERCUTANEOUS CORONARY STENT INTERVENTION (PCI-S);  Surgeon: [name], MD;  Location: [facility] CATH LAB;  Service: Cardiovascular;;  . Fractional flow reserve wire  10/07/2013    Procedure: FRACTIONAL FLOW RESERVE WIRE;  Surgeon: [name], MD;  Location: [facility] CATH LAB;  Service: Cardiovascular;;  . Left heart catheterization with coronary angiogram N/A 12/14/2013    Procedure: LEFT HEART CATHETERIZATION WITH CORONARY ANGIOGRAM;  Surgeon: Sinclair Grooms, MD;  Location: [facility] CATH LAB;  Service: Cardiovascular;  Laterality: N/A;  . Left  heart catheterization with coronary angiogram N/A 05/16/2014    Procedure: LEFT HEART CATHETERIZATION WITH CORONARY ANGIOGRAM;  Surgeon: Sherren Mocha, MD;  Location[facility] C[facility] LAB;  Service: Cardiovascular;  Laterality: N/A;     Current Outpatient Prescriptions  Medication Sig Dispense Refill  . aspirin 81 MG chewable tablet Chew 1 tablet (81 mg total) by mouth daily. 30 tablet 3  . atorvastatin (LIPITOR) 80 MG tablet TAKE 1 TABLET (80 MG TOTAL) BY MOUTH EVERY MORNING. 30 tablet 5  . Calcium Carb-Cholecalciferol (CALTRATE 600+D) 600-800 MG-UNIT TABS Take 1 tablet by mouth every morning.     . clopidogrel (PLAVIX) 75 MG tablet TAKE 1 TABLET BY MOUTH EVERY DAY WITH BREAKFAST 30 tablet 3  . Coenzyme Q-10 100 MG capsule Take 200 mg by mouth daily.     Marland Kitchen diltiazem (CARDIZEM CD) 180 MG 24 hr capsule Take 180 mg by mouth.  3  . furosemide (LASIX) 40 MG tablet TAKE TWO TABLETS (80MG ) EVERY MORNING AND 1 TABLET (40MG ) EVERY EVENING AS NEEDED 90 tablet 0  . isosorbide mononitrate (IMDUR) 60 MG 24 hr tablet TAKE 1 TABLET BY MOUTH EVERY MORNING AND 1/2 TABLET EVERY EVENING 45 tablet 6  . lisinopril (PRINIVIL,ZESTRIL) 5 MG tablet Take 1 tablet (5 mg total)  by mouth 2 (two) times daily. 60 tablet 6  . metoprolol tartrate (LOPRESSOR) 100 MG tablet Take 1 tablet (100 mg total) by mouth 2 (two) times daily. 60 tablet 11  . Multiple Vitamin (MULTIVITAMIN WITH MINERALS) TABS tablet Take 1 tablet by mouth every morning.    . Multiple Vitamins-Minerals (OCUVITE PRESERVISION) TABS Take 1 tablet by mouth daily.     Marland Kitchen NITROSTAT 0.4 MG SL tablet PLACE 1 TABLET (0.4 MG TOTAL) UNDER THE TONGUE EVERY 5 (FIVE) MINUTES AS NEEDED. FOR CHEST PAIN (Patient not taking: Reported on 06/13/2014) 25 tablet 3  . Omega-3 Fatty Acids (FISH OIL PO) Take 1,200 mg by mouth daily.     . potassium chloride SA (K-DUR,KLOR-CON) 20 MEQ tablet Take 20 mEq by mouth daily.    Marland Kitchen spironolactone (ALDACTONE) 25 MG tablet Take 12.5 mg by mouth at  bedtime.     Marland Kitchen warfarin (COUMADIN) 2 MG tablet TAKE 1 TABLET ON ALL DAYS EXCEPT 1/2 TABLET ON TUESDAY OR AS DIRECTED BY THE COUMADIN CLINIC 30 tablet 3   No current facility-administered medications for this visit.    Allergies:   Bee venom and Other    Social History:  The patient  reports that she quit smoking about 40 years ago. Her smoking use included Cigarettes. She has a 32 pack-year smoking history. She has never used smokeless tobacco. She reports that she drinks about 3.0 oz of alcohol per week. She reports that she does not use illicit drugs.   Family History:  The patient's family history includes Diabetes in her brother and father; Heart attack in her brother and father; Hypertension in her brother and father; Kidney failure in her brother; Stroke in her mother.    ROS:   Please see the history of present illness.   ROS    PHYSICAL EXAM: VS:  There were no vitals taken for this visit.    Wt Readings from Last 3 Encounters:  06/13/14 173 lb (78.472 kg)  05/24/14 170 lb 12.8 oz (77.474 kg)  05/17/14 167 lb 8.8 oz (76 kg)     GEN: Well nourished, well developed, in no acute distress HEENT: normal Neck: no JVD, no carotid bruits, no masses Cardiac:  Normal S1/S2, RRR; no murmur ,  no rubs or gallops, no edema  Respiratory:  clear to auscultation bilaterally, no wheezing, rhonchi or rales. GI: soft, nontender, nondistended, + BS MS: no deformity or atrophy Skin: warm and dry  Neuro:  CNs II-XII intact, Strength and sensation are intact Psych: Normal affect   EKG:  EKG is ordered today.  It demonstrates:      Recent Labs: 01/05/2014: Pro B Natriuretic peptide (BNP) 1341.0* 01/13/2014: TSH 4.65* 05/14/2014: B Natriuretic Peptide 386.6* 06/13/2014: ALT 20; BUN 29.4*; Creatinine 1.3*; Hemoglobin 12.9; Platelets 196; Potassium 4.1; Sodium 142    Lipid Panel    Component Value Date/Time   CHOL 125 01/06/2014 0021   TRIG 70 01/06/2014 0021   HDL 64 01/06/2014 0021    CHOLHDL 2.0 01/06/2014 0021   VLDL 14 01/06/2014 0021   LDLCALC 47 01/06/2014 0021      ASSESSMENT AND PLAN:  Coronary artery disease involving native coronary artery of native heart without angina pectoris  Chronic combined systolic and diastolic CHF (congestive heart failure)  Chronic atrial fibrillation  Essential hypertension  Hyperlipidemia  Tachycardia-bradycardia syndrome  Pacemaker  OSA (obstructive sleep apnea)    Current medicines are reviewed at length with the patient today.  Concerns regarding medicines are  as outlined above.  The following changes have been made:       Labs/ tests ordered today include:  No orders of the defined types were placed in this encounter.    Disposition:   FU with    Signed, Richardson Dopp, PA-C, M[facility] 06/14/2014 8:06 AM    [address], , [place]  [address] Phone: [phone]; Fax: [phone]    This encounter was created in error - please disregard.

## 2014-06-15 NOTE — Progress Notes (Signed)
Cardiology Office Note   Date:  06/16/2014   ID:  Patty [name], DOB 04/12/1930, MRN [id number]  PCP:  Dorian Heckle, MD  Cardiologist:  Dr. Casandra Doffing   Electrophysiologist:  Dr. Thompson Grayer   Chief Complaint  Patient presents with  . Hospitalization Follow-up  . Coronary Artery Disease  . Atrial Fibrillation  . Congestive Heart Failure     History of Present Illness: Patty [name] is a 81 y.o. female with a hx of CAD with prior multiple PCI to LAD/LCx/RCA, combined systolic and diastolic CHF, HTN, HL, atrial fibrillation, tachybradycardia syndrome status post leadless pacemaker, PAD, OSA on CPAP, LBBB, granulosa cel CA.   Admitted 4/10-4/13 with unstable angina and mildly elevated troponins. LHC demonstrated significant proximal/ostial LAD stenosis which was treated with a Synergy DES. Circumflex RCA stents were patent. EF 45-50% by echocardiogram. Given need for anticoagulation for atrial fibrillation, it was recommended she remain on triple therapy with aspirin, Plavix and warfarin for 1-3 months. She would then remain on warfarin/Plavix.  She returns for FU.  She is doing very well.  The patient denies chest pain, shortness of breath, syncope, orthopnea, PND or significant pedal edema.  Sleeps with CPAP every night.  No bleeding problems.   Studies/Reports Reviewed Today:  LHC/PCI 05/16/14 LM: without obstruction.  LAD: severe 90% proximal/ostial stenosis, stented segment proximal through mid LAD patent with mild ISR. Well formed collateral from the apical LAD supplying a ramus intermedius branch. LCx: Stented segment proximal circumflex has mild in-stent restenosis. OM1 occluded and fills from L-L collateral supply by the apical LAD. OM2 75% ostial stenosis and it is a small vessel. The left posterolateral branch is patent. RCA:The stented segments are patent. The vessel has mild diffuse disease without high-grade stenosis. The PDA and PLA branches are patent. PCI  Data: Vessel - LAD/Segment - proximal Stent 3.5x16 mm Synergy DES Recommendations: 'Triple Rx' with ASA 81 mg, plavix 75 mg, and warfarin 1-3 months, then warfarin/plavix ongoing.   Echocardiogram 05/14/14 Study Conclusions - Moderate LVH. EF 45% to 50%. Diffuse hypokinesis. - Left atrium: The atrium was mildly dilated.   Past Medical History  Diagnosis Date  . Hypertension   . Hyperlipidemia   . Obesity   . Diverticulitis   . Peripheral vascular disease   . Varicose veins   . Granulosa cell carcinoma     abd; last episode was in 2009  . Tachycardia-bradycardia syndrome     a. s/p leadless pacemaker ([name]) implanted by Dr [name]  . Permanent atrial fibrillation 2013  . OSA on CPAP   . Combined systolic and diastolic heart failure, NYHA class 3   . Coronary artery disease     a. s/p multiple stents  b. LHC 12/14/13 with sig CAD with TO of OM1, 70-80% occusion OM2, 50-70% mLCx, 50-70% pLAD (recent FFR 0.83), diffusely diseased RCA, widely patent stetns in pLAD and LCx.  Marland Kitchen LBBB (left bundle branch block)        . Myocardial infarction 2002  . Renal artery stenosis     Past Surgical History  Procedure Laterality Date  . Cholecystectomy  1980's  . Salivary gland surgery  2000's    "had a little lump removed; granulosa related; it was benign" (08/11/2012)  . Abdominal hysterectomy    . Colon surgery  2004    colectomy for diverticulosis  . Granulosa tumor excision  2000; 2003; 2004; 2007    "all in my abdomen including small intestines, outside my ?uterus/etc" (  08/11/2012)  . Tee without cardioversion  12/31/2011    Procedure: TRANSESOPHAGEAL ECHOCARDIOGRAM (TEE);  Surgeon: [name], MD;  Location: [place];  Service: Cardiovascular;  Laterality: N/A;  . Cardioversion  12/31/2011    Procedure: CARDIOVERSION;  Surgeon: [name], MD;  Location: [place];  Service: Cardiovascular;  Laterality: N/A;  . Cardiac catheterization  09/03/2007    EF 70%;  Failed attempt at PCI to OM  . Cardiac catheterization  11/01/2003    EF 70%  . Coronary angioplasty with stent placement  2000; 08/11/2012; 11/12/2012    3 + 2 LAD & CFX; 2nd CFX stent 11/12/2012  . Varicose vein surgery Bilateral 1977  . Hernia repair  2005    "laparoscopic"  . Cataract extraction, bilateral  2015  . Cardioversion N/A 12/31/2011    Procedure: CARDIOVERSION;  Surgeon: [name], MD;  Location: [facility] CATH LAB;  Service: Cardiovascular;  Laterality: N/A;  . Permanent pacemaker insertion N/A 03/16/2012    [name] (SJM) leadless pacemaker (LEADLESS II STUDY PATEINT)  . Percutaneous coronary intervention-balloon only  08/04/2012    Procedure: PERCUTANEOUS CORONARY INTERVENTION-BALLOON ONLY;  Surgeon: [name], MD;  Location: [facility] CATH LAB;  Service: Cardiovascular;;  . Percutaneous coronary rotoblator intervention (pci-r) N/A 08/11/2012    Procedure: PERCUTANEOUS CORONARY ROTOBLATOR INTERVENTION (PCI-R);  Surgeon: [name], MD;  Location: [facility] CATH LAB;  Service: Cardiovascular;  Laterality: N/A;  . Left heart catheterization with coronary angiogram N/A 11/12/2012    Procedure: LEFT HEART CATHETERIZATION WITH CORONARY ANGIOGRAM;  Surgeon: [name], MD;  Location: [facility] CATH LAB;  Service: Cardiovascular;  Laterality: N/A;  . Left heart catheterization with coronary angiogram N/A 10/07/2013    Procedure: LEFT HEART CATHETERIZATION WITH CORONARY ANGIOGRAM;  Surgeon: [name], MD;  Location: [facility] CATH LAB;  Service: Cardiovascular;  Laterality: N/A;  . Percutaneous coronary stent intervention (pci-s)  10/07/2013    Procedure: PERCUTANEOUS CORONARY STENT INTERVENTION (PCI-S);  Surgeon: [name], MD;  Location: [facility] CATH LAB;  Service: Cardiovascular;;  . Fractional flow reserve wire  10/07/2013    Procedure: FRACTIONAL FLOW RESERVE WIRE;  Surgeon: [name], MD;  Location: [facility] CATH LAB;  Service: Cardiovascular;;  . Left heart  catheterization with coronary angiogram N/A 12/14/2013    Procedure: LEFT HEART CATHETERIZATION WITH CORONARY ANGIOGRAM;  Surgeon: Sinclair Grooms, MD;  Location: [facility] CATH LAB;  Service: Cardiovascular;  Laterality: N/A;  . Left heart catheterization with coronary angiogram N/A 05/16/2014    Procedure: LEFT HEART CATHETERIZATION WITH CORONARY ANGIOGRAM;  Surgeon: Sherren Mocha, MD;  Location: [facility] CATH LAB;  Service: Cardiovascular;  Laterality: N/A;     Current Outpatient Prescriptions  Medication Sig Dispense Refill  . aspirin 81 MG chewable tablet Chew 1 tablet (81 mg total) by mouth daily. 30 tablet 3  . atorvastatin (LIPITOR) 80 MG tablet TAKE 1 TABLET (80 MG TOTAL) BY MOUTH EVERY MORNING. 30 tablet 5  . Calcium Carb-Cholecalciferol (CALTRATE 600+D) 600-800 MG-UNIT TABS Take 1 tablet by mouth every morning.     . clopidogrel (PLAVIX) 75 MG tablet TAKE 1 TABLET BY MOUTH EVERY DAY WITH BREAKFAST 30 tablet 3  . Coenzyme Q-10 100 MG capsule Take 200 mg by mouth daily.     Marland Kitchen diltiazem (CARDIZEM CD) 180 MG 24 hr capsule Take 180 mg by mouth daily.   3  . furosemide (LASIX) 40 MG tablet TAKE TWO TABLETS (80MG ) EVERY MORNING AND 1 TABLET (40MG ) EVERY EVENING AS NEEDED  90 tablet 0  . isosorbide mononitrate (IMDUR) 60 MG 24 hr tablet TAKE 1 TABLET BY MOUTH EVERY MORNING AND 1/2 TABLET EVERY EVENING 45 tablet 6  . lisinopril (PRINIVIL,ZESTRIL) 5 MG tablet Take 1 tablet (5 mg total) by mouth 2 (two) times daily. 60 tablet 6  . metoprolol tartrate (LOPRESSOR) 100 MG tablet Take 1 tablet (100 mg total) by mouth 2 (two) times daily. 60 tablet 11  . Multiple Vitamin (MULTIVITAMIN WITH MINERALS) TABS tablet Take 1 tablet by mouth every morning.    . Multiple Vitamins-Minerals (OCUVITE PRESERVISION) TABS Take 1 tablet by mouth daily.     Marland Kitchen NITROSTAT 0.4 MG SL tablet PLACE 1 TABLET (0.4 MG TOTAL) UNDER THE TONGUE EVERY 5 (FIVE) MINUTES AS NEEDED. FOR CHEST PAIN 25 tablet 3  . Omega-3 Fatty Acids (FISH OIL PO)  Take 1,200 mg by mouth daily.     . potassium chloride SA (K-DUR,KLOR-CON) 20 MEQ tablet Take 20 mEq by mouth daily.    Marland Kitchen spironolactone (ALDACTONE) 25 MG tablet Take 12.5 mg by mouth at bedtime.     Marland Kitchen warfarin (COUMADIN) 2 MG tablet TAKE 1 TABLET ON ALL DAYS EXCEPT 1/2 TABLET ON TUESDAY OR AS DIRECTED BY THE COUMADIN CLINIC 30 tablet 3   No current facility-administered medications for this visit.    Allergies:   Bee venom and Other    Social History:  The patient  reports that she quit smoking about 40 years ago. Her smoking use included Cigarettes. She has a 32 pack-year smoking history. She has never used smokeless tobacco. She reports that she drinks about 3.0 oz of alcohol per week. She reports that she does not use illicit drugs.   Family History:  The patient's family history includes Diabetes in her brother and father; Heart attack in her brother and father; Hypertension in her brother and father; Kidney failure in her brother; Stroke in her mother.    ROS:   Please see the history of present illness.   Review of Systems  All other systems reviewed and are negative.    PHYSICAL EXAM: VS:  BP 127/57 mmHg  Pulse 64  Ht 5' (1.524 m)  Wt 172 lb (78.019 kg)  BMI 33.59 kg/m2    Wt Readings from Last 3 Encounters:  06/16/14 172 lb (78.019 kg)  06/13/14 173 lb (78.472 kg)  05/24/14 170 lb 12.8 oz (77.474 kg)     GEN: Well nourished, well developed, in no acute distress HEENT: normal Neck: no JVD,   no masses Cardiac:  Normal S1/S2, RRR; no murmur ,  no rubs or gallops, no edema ; right wrist without hematoma or mass  Respiratory:  clear to auscultation bilaterally, no wheezing, rhonchi or rales. GI: soft, nontender, nondistended, + BS MS: no deformity or atrophy Skin: warm and dry  Neuro:  CNs II-XII intact, Strength and sensation are intact Psych: Normal affect   EKG:  EKG is not ordered today.  It demonstrates:   n/a   Recent Labs: 01/05/2014: Pro B Natriuretic  peptide (BNP) 1341.0* 01/13/2014: TSH 4.65* 05/14/2014: B Natriuretic Peptide 386.6* 06/13/2014: ALT 20; BUN 29.4*; Creatinine 1.3*; Hemoglobin 12.9; Platelets 196; Potassium 4.1; Sodium 142    Lipid Panel    Component Value Date/Time   CHOL 125 01/06/2014 0021   TRIG 70 01/06/2014 0021   HDL 64 01/06/2014 0021   CHOLHDL 2.0 01/06/2014 0021   VLDL 14 01/06/2014 0021   LDLCALC 47 01/06/2014 0021      ASSESSMENT  AND PLAN:  Coronary artery disease involving native coronary artery of native heart without angina pectoris She is doing well since her recent PCI for [place].  Continue ASA, Plavix and Coumadin for now. She is tolerating "triple Rx."  I will have her see Dr. Casandra Doffing in 4-6 weeks.  I would expect she can stop ASA at that point and remain on Plavix + Coumadin.  Continue beta-blocker, ACE inhibitor, statin, nitrates.  Chronic combined systolic and diastolic CHF (congestive heart failure) Volume stable continue current therapy.  Chronic atrial fibrillation Rate controlled. Continue Coumadin.  Essential hypertension Controlled.  Hyperlipidemia Continue statin.  Tachycardia-bradycardia syndrome s/p Pacemaker Follow up with EP as planned.  OSA (obstructive sleep apnea)  Continue CPAP.  Current medicines are reviewed at length with the patient today.  Concerns regarding medicines are as outlined above.  The following changes have been made:    None    Labs/ tests ordered today include:  No orders of the defined types were placed in this encounter.    Disposition:   FU with Dr. Casandra Doffing 4-6 weeks.    Signed, Versie Starks, MHS 06/16/2014 11:23 AM    [facility] [place], [place], [place]  [address] Phone: [phone]; Fax: [phone]

## 2014-06-16 ENCOUNTER — Encounter (HOSPITAL_COMMUNITY): Payer: Medicare Other

## 2014-06-16 ENCOUNTER — Ambulatory Visit (INDEPENDENT_AMBULATORY_CARE_PROVIDER_SITE_OTHER): Payer: Medicare Other | Admitting: *Deleted

## 2014-06-16 ENCOUNTER — Encounter: Payer: Self-pay | Admitting: Physician Assistant

## 2014-06-16 ENCOUNTER — Encounter: Payer: Self-pay | Admitting: Internal Medicine

## 2014-06-16 ENCOUNTER — Ambulatory Visit (INDEPENDENT_AMBULATORY_CARE_PROVIDER_SITE_OTHER): Payer: Medicare Other | Admitting: Physician Assistant

## 2014-06-16 VITALS — BP 127/57 | HR 64 | Ht 60.0 in | Wt 172.0 lb

## 2014-06-16 DIAGNOSIS — C569 Malignant neoplasm of unspecified ovary: Secondary | ICD-10-CM | POA: Insufficient documentation

## 2014-06-16 DIAGNOSIS — Z950 Presence of cardiac pacemaker: Secondary | ICD-10-CM

## 2014-06-16 DIAGNOSIS — I495 Sick sinus syndrome: Secondary | ICD-10-CM

## 2014-06-16 DIAGNOSIS — I10 Essential (primary) hypertension: Secondary | ICD-10-CM | POA: Diagnosis not present

## 2014-06-16 DIAGNOSIS — I25119 Atherosclerotic heart disease of native coronary artery with unspecified angina pectoris: Secondary | ICD-10-CM | POA: Diagnosis not present

## 2014-06-16 DIAGNOSIS — I4891 Unspecified atrial fibrillation: Secondary | ICD-10-CM

## 2014-06-16 DIAGNOSIS — I2510 Atherosclerotic heart disease of native coronary artery without angina pectoris: Secondary | ICD-10-CM | POA: Diagnosis not present

## 2014-06-16 DIAGNOSIS — E785 Hyperlipidemia, unspecified: Secondary | ICD-10-CM

## 2014-06-16 DIAGNOSIS — G4733 Obstructive sleep apnea (adult) (pediatric): Secondary | ICD-10-CM

## 2014-06-16 DIAGNOSIS — I482 Chronic atrial fibrillation, unspecified: Secondary | ICD-10-CM

## 2014-06-16 DIAGNOSIS — I5042 Chronic combined systolic (congestive) and diastolic (congestive) heart failure: Secondary | ICD-10-CM

## 2014-06-16 LAB — POCT INR: INR: 2.4

## 2014-06-16 NOTE — Patient Instructions (Signed)
Medication Instructions:  Your physician recommends that you continue on your current medications as directed. Please refer to the Current Medication list given to you today.  Labwork: NONE  Testing/Procedures: NONE  Follow-Up: Your physician recommends that you schedule a follow-up appointment in: 4 to 6 weeks with Dr. Irish Lack.   Any Other Special Instructions Will Be Listed Below (If Applicable).

## 2014-06-19 ENCOUNTER — Encounter (HOSPITAL_COMMUNITY): Payer: Medicare Other

## 2014-06-20 ENCOUNTER — Other Ambulatory Visit: Payer: Self-pay | Admitting: Interventional Cardiology

## 2014-06-21 ENCOUNTER — Encounter (HOSPITAL_COMMUNITY): Payer: Medicare Other

## 2014-06-23 ENCOUNTER — Encounter (HOSPITAL_COMMUNITY): Payer: Medicare Other

## 2014-06-26 ENCOUNTER — Other Ambulatory Visit: Payer: Medicare Other

## 2014-06-26 ENCOUNTER — Encounter (HOSPITAL_COMMUNITY): Payer: Medicare Other

## 2014-06-26 ENCOUNTER — Ambulatory Visit: Payer: Medicare Other | Admitting: Oncology

## 2014-06-28 ENCOUNTER — Encounter (HOSPITAL_COMMUNITY): Payer: Medicare Other

## 2014-06-29 ENCOUNTER — Ambulatory Visit: Payer: Medicare Other | Admitting: Interventional Cardiology

## 2014-06-30 ENCOUNTER — Encounter (HOSPITAL_COMMUNITY): Payer: Medicare Other

## 2014-07-07 ENCOUNTER — Other Ambulatory Visit: Payer: Self-pay | Admitting: Interventional Cardiology

## 2014-07-07 ENCOUNTER — Other Ambulatory Visit: Payer: Self-pay

## 2014-07-07 MED ORDER — ATORVASTATIN CALCIUM 80 MG PO TABS
ORAL_TABLET | ORAL | Status: DC
Start: 2014-07-07 — End: 2015-01-24

## 2014-07-07 NOTE — Telephone Encounter (Signed)
Per note 5.13.16

## 2014-07-10 ENCOUNTER — Encounter (HOSPITAL_COMMUNITY)
Admission: RE | Admit: 2014-07-10 | Discharge: 2014-07-10 | Disposition: A | Discharge status: Home / Self Care | Payer: Self-pay | Source: Ambulatory Visit | Attending: Interventional Cardiology | Admitting: Interventional Cardiology

## 2014-07-10 DIAGNOSIS — Z48812 Encounter for surgical aftercare following surgery on the circulatory system: Secondary | ICD-10-CM | POA: Insufficient documentation

## 2014-07-10 DIAGNOSIS — Z955 Presence of coronary angioplasty implant and graft: Secondary | ICD-10-CM | POA: Insufficient documentation

## 2014-07-12 ENCOUNTER — Ambulatory Visit (INDEPENDENT_AMBULATORY_CARE_PROVIDER_SITE_OTHER): Payer: Medicare Other | Admitting: *Deleted

## 2014-07-12 ENCOUNTER — Encounter (HOSPITAL_COMMUNITY)
Admission: RE | Admit: 2014-07-12 | Discharge: 2014-07-12 | Disposition: A | Discharge status: Home / Self Care | Payer: Self-pay | Source: Ambulatory Visit | Attending: Interventional Cardiology | Admitting: Interventional Cardiology

## 2014-07-12 DIAGNOSIS — I4891 Unspecified atrial fibrillation: Secondary | ICD-10-CM | POA: Diagnosis not present

## 2014-07-12 LAB — POCT INR: INR: 2.3

## 2014-07-14 ENCOUNTER — Encounter (HOSPITAL_COMMUNITY)
Admission: RE | Admit: 2014-07-14 | Discharge: 2014-07-14 | Disposition: A | Discharge status: Home / Self Care | Payer: Self-pay | Source: Ambulatory Visit | Attending: Interventional Cardiology | Admitting: Interventional Cardiology

## 2014-07-17 ENCOUNTER — Encounter (HOSPITAL_COMMUNITY)
Admission: RE | Admit: 2014-07-17 | Discharge: 2014-07-17 | Disposition: A | Discharge status: Home / Self Care | Payer: Self-pay | Source: Ambulatory Visit | Attending: Interventional Cardiology | Admitting: Interventional Cardiology

## 2014-07-18 DIAGNOSIS — M722 Plantar fascial fibromatosis: Secondary | ICD-10-CM | POA: Diagnosis not present

## 2014-07-19 ENCOUNTER — Encounter (HOSPITAL_COMMUNITY)
Admission: RE | Admit: 2014-07-19 | Discharge: 2014-07-19 | Disposition: A | Discharge status: Home / Self Care | Payer: Self-pay | Source: Ambulatory Visit | Attending: Interventional Cardiology | Admitting: Interventional Cardiology

## 2014-07-20 ENCOUNTER — Other Ambulatory Visit: Payer: Self-pay

## 2014-07-20 ENCOUNTER — Encounter: Payer: Medicare Other | Admitting: Internal Medicine

## 2014-07-20 DIAGNOSIS — Z1231 Encounter for screening mammogram for malignant neoplasm of breast: Secondary | ICD-10-CM

## 2014-07-21 ENCOUNTER — Encounter (HOSPITAL_COMMUNITY)
Admission: RE | Admit: 2014-07-21 | Discharge: 2014-07-21 | Disposition: A | Discharge status: Home / Self Care | Payer: Self-pay | Source: Ambulatory Visit | Attending: Interventional Cardiology | Admitting: Interventional Cardiology

## 2014-07-24 ENCOUNTER — Encounter (HOSPITAL_COMMUNITY): Payer: Self-pay

## 2014-07-24 ENCOUNTER — Telehealth: Payer: Self-pay | Admitting: *Deleted

## 2014-07-24 MED ORDER — DILTIAZEM HCL ER COATED BEADS 180 MG PO CP24: 180.0000 mg | ORAL_CAPSULE | Freq: Every day | ORAL | Status: DC

## 2014-07-24 NOTE — Addendum Note (Signed)
Addended by: Janan Halter F on: 07/24/2014 04:18 PM   Modules accepted: Orders

## 2014-07-24 NOTE — Telephone Encounter (Signed)
Patient called me this morning to tell me that she has noticed her heart rate has been more elevated 120's for last week. Pt stated she does not feel well with higher heart rate. Her cardizem was cut back to 120 mg at last office with [employer]. Pt states she feels better on higher dose of 180 mg qd. She would like the previous dose called into [facility] on [address]. I will notify office to call the previous dose in for patient.

## 2014-07-24 NOTE — Telephone Encounter (Signed)
Cardizem 180mg  called into pharmacy

## 2014-07-26 ENCOUNTER — Encounter (HOSPITAL_COMMUNITY)
Admission: RE | Admit: 2014-07-26 | Discharge: 2014-07-26 | Disposition: A | Discharge status: Home / Self Care | Payer: Self-pay | Source: Ambulatory Visit | Attending: Interventional Cardiology | Admitting: Interventional Cardiology

## 2014-07-28 ENCOUNTER — Ambulatory Visit (INDEPENDENT_AMBULATORY_CARE_PROVIDER_SITE_OTHER): Payer: Medicare Other | Admitting: Podiatry

## 2014-07-28 ENCOUNTER — Ambulatory Visit: Payer: Medicare Other

## 2014-07-28 ENCOUNTER — Encounter (HOSPITAL_COMMUNITY)
Admission: RE | Admit: 2014-07-28 | Discharge: 2014-07-28 | Disposition: A | Discharge status: Home / Self Care | Payer: Self-pay | Source: Ambulatory Visit | Attending: Interventional Cardiology | Admitting: Interventional Cardiology

## 2014-07-28 ENCOUNTER — Encounter: Payer: Self-pay | Admitting: Podiatry

## 2014-07-28 VITALS — BP 123/77 | HR 102 | Resp 15

## 2014-07-28 DIAGNOSIS — I25119 Atherosclerotic heart disease of native coronary artery with unspecified angina pectoris: Secondary | ICD-10-CM

## 2014-07-28 DIAGNOSIS — M722 Plantar fascial fibromatosis: Secondary | ICD-10-CM

## 2014-07-28 DIAGNOSIS — M79673 Pain in unspecified foot: Secondary | ICD-10-CM

## 2014-07-28 MED ORDER — TRIAMCINOLONE ACETONIDE 10 MG/ML IJ SUSP
10.0000 mg | Freq: Once | INTRAMUSCULAR | Status: AC
  Administered 2014-07-28: 10 mg

## 2014-07-28 NOTE — Progress Notes (Signed)
   Subjective:    Patient ID: Patty [name], female    DOB: [birth year], 81 y.o.   MRN: [id number]  HPI Pt presents with right heel pain, worsening over time, wears supptortive shoes, has tried ice therapy   Review of Systems  All other systems reviewed and are negative.      Objective:   Physical Exam        Assessment & Plan:

## 2014-07-28 NOTE — Patient Instructions (Signed)

## 2014-07-29 NOTE — Progress Notes (Signed)
Subjective:     Patient ID: Patty [name], female   DOB: Sep 05, 1930, 81 y.o.   MRN: [id number]  HPI patient presents stating she's developed a lot of pain in her heel right over left especially when getting up in the morning and after periods of sitting   Review of Systems  All other systems reviewed and are negative.      Objective:   Physical Exam  Constitutional: She is oriented to person, place, and time.  Cardiovascular: Intact distal pulses.   Musculoskeletal: Normal range of motion.  Neurological: She is oriented to person, place, and time.  Skin: Skin is warm.  Nursing note and vitals reviewed.  neurovascular status intact muscle strength adequate with range of motion subtalar midtarsal joint within normal limits. Patient's noted to have quite a bit of discomfort in the plantar heel right over left with inflammation and fluid buildup upon palpation     Assessment:     Inflammatory plantar fasciitis right with inflammation and fluid buildup noted    Plan:     H&P and condition reviewed with patient. Today I went ahead and I injected the right plantar fascia 3 mg Kenalog 5 mg Xylocaine and applied fascial brace to reduce pressure against the arch and heel. Reappoint one week to reevaluate

## 2014-07-31 ENCOUNTER — Telehealth: Payer: Self-pay | Admitting: Interventional Cardiology

## 2014-07-31 ENCOUNTER — Encounter (HOSPITAL_COMMUNITY)
Admission: RE | Admit: 2014-07-31 | Discharge: 2014-07-31 | Disposition: A | Discharge status: Home / Self Care | Payer: Self-pay | Source: Ambulatory Visit | Attending: Interventional Cardiology | Admitting: Interventional Cardiology

## 2014-07-31 ENCOUNTER — Ambulatory Visit: Payer: Medicare Other | Admitting: Podiatry

## 2014-07-31 NOTE — Telephone Encounter (Signed)
Left message to call back  

## 2014-07-31 NOTE — Telephone Encounter (Signed)
New Message    Rn is calling in to Give report to RN before pt is sent home  About her obsessive bruising.   Please call pt [phone]

## 2014-08-01 ENCOUNTER — Telehealth: Payer: Self-pay | Admitting: Interventional Cardiology

## 2014-08-01 NOTE — Telephone Encounter (Signed)
Informed pt that it is ok to stop ASA. Pt verbalized understanding and was in agreement with this plan.

## 2014-08-01 NOTE — Telephone Encounter (Signed)
OK to stop aspirin.

## 2014-08-01 NOTE — Telephone Encounter (Signed)
Spoke with pt and she states that starting last Wed or Thurs, she noticed large hematomas appearing on her legs. Pt states that she knows one came from accidentally hitting her leg with the car door but the others "just seemed to appear overnight." Pt denies pain, swelling or cramping to the areas. Pt states that she is use to getting smaller hematomas that heal quickly but these are much larger. Pt states that last Friday she received a Cortisone shot for plantar fasciitis but other then that no changes in meds. Pt states that the bruising is not any worse but not any better either since appearing last week. Pt currently on Coumadin 2mg  QD except takes 1mg  on Tues and Fri, Plavix 75 QD and ASA 81mg  QD. Pt's last INR on 07/12/14 was 2.3. Pt states that they told her in the hospital in April that she would only need to take the ASA for 1-3 months. Informed pt that I would route this information to Dr. Irish Lack or review and advisement.

## 2014-08-01 NOTE — Telephone Encounter (Signed)
New message ° ° ° ° ° °Pt returning Jennifer's call. Please advise °

## 2014-08-01 NOTE — Telephone Encounter (Signed)
See phone note from 6/28.

## 2014-08-02 ENCOUNTER — Ambulatory Visit: Payer: Medicare Other | Admitting: Interventional Cardiology

## 2014-08-02 ENCOUNTER — Encounter (HOSPITAL_COMMUNITY)
Admission: RE | Admit: 2014-08-02 | Discharge: 2014-08-02 | Disposition: A | Discharge status: Home / Self Care | Payer: Self-pay | Source: Ambulatory Visit | Attending: Interventional Cardiology | Admitting: Interventional Cardiology

## 2014-08-04 ENCOUNTER — Ambulatory Visit: Payer: Medicare Other | Admitting: Podiatry

## 2014-08-04 ENCOUNTER — Encounter (HOSPITAL_COMMUNITY)
Admission: RE | Admit: 2014-08-04 | Discharge: 2014-08-04 | Disposition: A | Discharge status: Home / Self Care | Payer: Self-pay | Source: Ambulatory Visit | Attending: Interventional Cardiology | Admitting: Interventional Cardiology

## 2014-08-04 DIAGNOSIS — Z48812 Encounter for surgical aftercare following surgery on the circulatory system: Secondary | ICD-10-CM | POA: Insufficient documentation

## 2014-08-04 DIAGNOSIS — Z955 Presence of coronary angioplasty implant and graft: Secondary | ICD-10-CM | POA: Insufficient documentation

## 2014-08-09 ENCOUNTER — Other Ambulatory Visit: Payer: Self-pay | Admitting: Interventional Cardiology

## 2014-08-09 ENCOUNTER — Encounter (HOSPITAL_COMMUNITY)
Admission: RE | Admit: 2014-08-09 | Discharge: 2014-08-09 | Disposition: A | Discharge status: Home / Self Care | Payer: Self-pay | Source: Ambulatory Visit | Attending: Interventional Cardiology | Admitting: Interventional Cardiology

## 2014-08-11 ENCOUNTER — Encounter: Payer: Self-pay | Admitting: Podiatry

## 2014-08-11 ENCOUNTER — Ambulatory Visit (INDEPENDENT_AMBULATORY_CARE_PROVIDER_SITE_OTHER): Payer: Medicare Other | Admitting: Podiatry

## 2014-08-11 ENCOUNTER — Encounter (HOSPITAL_COMMUNITY)
Admission: RE | Admit: 2014-08-11 | Discharge: 2014-08-11 | Disposition: A | Discharge status: Home / Self Care | Payer: Self-pay | Source: Ambulatory Visit | Attending: Interventional Cardiology | Admitting: Interventional Cardiology

## 2014-08-11 VITALS — BP 102/51 | HR 74 | Resp 15

## 2014-08-11 DIAGNOSIS — M722 Plantar fascial fibromatosis: Secondary | ICD-10-CM

## 2014-08-11 DIAGNOSIS — I25119 Atherosclerotic heart disease of native coronary artery with unspecified angina pectoris: Secondary | ICD-10-CM

## 2014-08-12 NOTE — Progress Notes (Signed)
Subjective:     Patient ID: Patty [name], female   DOB: [birth year], 81 y.o.   MRN: [id number]  HPI patient presents stating my heel is feeling quite a bit better with discomfort still noted but improved from previous visit   Review of Systems     Objective:   Physical Exam Patient's heel is improving but still painful when present and also is noted to have moderate depression of the arch noted    Assessment:     Plantar fasciitis right still present but improved    Plan:     Advised on physical therapy anti-inflammatory and patient be seen back for consideration of treatment if symptoms were to persist

## 2014-08-14 ENCOUNTER — Encounter (HOSPITAL_COMMUNITY)
Admission: RE | Admit: 2014-08-14 | Discharge: 2014-08-14 | Disposition: A | Discharge status: Home / Self Care | Payer: Medicare Other | Source: Ambulatory Visit | Attending: Interventional Cardiology | Admitting: Interventional Cardiology

## 2014-08-14 ENCOUNTER — Ambulatory Visit
Admission: RE | Admit: 2014-08-14 | Discharge: 2014-08-14 | Disposition: A | Discharge status: Home / Self Care | Payer: Medicare Other | Source: Ambulatory Visit

## 2014-08-14 DIAGNOSIS — Z1231 Encounter for screening mammogram for malignant neoplasm of breast: Secondary | ICD-10-CM

## 2014-08-16 ENCOUNTER — Encounter (HOSPITAL_COMMUNITY): Payer: Self-pay

## 2014-08-18 ENCOUNTER — Encounter (HOSPITAL_COMMUNITY)
Admission: RE | Admit: 2014-08-18 | Discharge: 2014-08-18 | Disposition: A | Discharge status: Home / Self Care | Payer: Self-pay | Source: Ambulatory Visit | Attending: Interventional Cardiology | Admitting: Interventional Cardiology

## 2014-08-21 ENCOUNTER — Encounter (HOSPITAL_COMMUNITY): Payer: Self-pay

## 2014-08-23 ENCOUNTER — Encounter (HOSPITAL_COMMUNITY)
Admission: RE | Admit: 2014-08-23 | Discharge: 2014-08-23 | Disposition: A | Discharge status: Home / Self Care | Payer: Self-pay | Source: Ambulatory Visit | Attending: Interventional Cardiology | Admitting: Interventional Cardiology

## 2014-08-25 ENCOUNTER — Ambulatory Visit (INDEPENDENT_AMBULATORY_CARE_PROVIDER_SITE_OTHER): Payer: Medicare Other

## 2014-08-25 ENCOUNTER — Encounter (HOSPITAL_COMMUNITY)
Admission: RE | Admit: 2014-08-25 | Discharge: 2014-08-25 | Disposition: A | Discharge status: Home / Self Care | Payer: Self-pay | Source: Ambulatory Visit | Attending: Interventional Cardiology | Admitting: Interventional Cardiology

## 2014-08-25 DIAGNOSIS — Z5181 Encounter for therapeutic drug level monitoring: Secondary | ICD-10-CM

## 2014-08-25 DIAGNOSIS — I4891 Unspecified atrial fibrillation: Secondary | ICD-10-CM | POA: Diagnosis not present

## 2014-08-25 LAB — POCT INR: INR: 2.5

## 2014-08-28 ENCOUNTER — Encounter (HOSPITAL_COMMUNITY)
Admission: RE | Admit: 2014-08-28 | Discharge: 2014-08-28 | Disposition: A | Discharge status: Home / Self Care | Payer: Self-pay | Source: Ambulatory Visit | Attending: Interventional Cardiology | Admitting: Interventional Cardiology

## 2014-08-30 ENCOUNTER — Encounter (HOSPITAL_COMMUNITY)
Admission: RE | Admit: 2014-08-30 | Discharge: 2014-08-30 | Disposition: A | Discharge status: Home / Self Care | Payer: Self-pay | Source: Ambulatory Visit | Attending: Interventional Cardiology | Admitting: Interventional Cardiology

## 2014-09-01 ENCOUNTER — Encounter (HOSPITAL_COMMUNITY)
Admission: RE | Admit: 2014-09-01 | Discharge: 2014-09-01 | Disposition: A | Discharge status: Home / Self Care | Payer: Self-pay | Source: Ambulatory Visit | Attending: Interventional Cardiology | Admitting: Interventional Cardiology

## 2014-09-04 ENCOUNTER — Encounter (HOSPITAL_COMMUNITY)
Admission: RE | Admit: 2014-09-04 | Discharge: 2014-09-04 | Disposition: A | Discharge status: Home / Self Care | Payer: Self-pay | Source: Ambulatory Visit | Attending: Interventional Cardiology | Admitting: Interventional Cardiology

## 2014-09-04 DIAGNOSIS — Z48812 Encounter for surgical aftercare following surgery on the circulatory system: Secondary | ICD-10-CM | POA: Insufficient documentation

## 2014-09-04 DIAGNOSIS — Z955 Presence of coronary angioplasty implant and graft: Secondary | ICD-10-CM | POA: Insufficient documentation

## 2014-09-06 ENCOUNTER — Encounter (HOSPITAL_COMMUNITY)
Admission: RE | Admit: 2014-09-06 | Discharge: 2014-09-06 | Disposition: A | Discharge status: Home / Self Care | Payer: Self-pay | Source: Ambulatory Visit | Attending: Interventional Cardiology | Admitting: Interventional Cardiology

## 2014-09-08 ENCOUNTER — Encounter (HOSPITAL_COMMUNITY)
Admission: RE | Admit: 2014-09-08 | Discharge: 2014-09-08 | Disposition: A | Discharge status: Home / Self Care | Payer: Self-pay | Source: Ambulatory Visit | Attending: Interventional Cardiology | Admitting: Interventional Cardiology

## 2014-09-10 ENCOUNTER — Other Ambulatory Visit: Payer: Self-pay | Admitting: Interventional Cardiology

## 2014-09-11 ENCOUNTER — Encounter (HOSPITAL_COMMUNITY)
Admission: RE | Admit: 2014-09-11 | Discharge: 2014-09-11 | Disposition: A | Discharge status: Home / Self Care | Payer: Self-pay | Source: Ambulatory Visit | Attending: Interventional Cardiology | Admitting: Interventional Cardiology

## 2014-09-13 ENCOUNTER — Encounter (HOSPITAL_COMMUNITY): Payer: Self-pay

## 2014-09-15 ENCOUNTER — Encounter (HOSPITAL_COMMUNITY)
Admission: RE | Admit: 2014-09-15 | Discharge: 2014-09-15 | Disposition: A | Discharge status: Home / Self Care | Payer: Self-pay | Source: Ambulatory Visit | Attending: Interventional Cardiology | Admitting: Interventional Cardiology

## 2014-09-18 ENCOUNTER — Encounter (HOSPITAL_COMMUNITY)
Admission: RE | Admit: 2014-09-18 | Discharge: 2014-09-18 | Disposition: A | Discharge status: Home / Self Care | Payer: Self-pay | Source: Ambulatory Visit | Attending: Interventional Cardiology | Admitting: Interventional Cardiology

## 2014-09-20 ENCOUNTER — Encounter (HOSPITAL_COMMUNITY)
Admission: RE | Admit: 2014-09-20 | Discharge: 2014-09-20 | Disposition: A | Discharge status: Home / Self Care | Payer: Self-pay | Source: Ambulatory Visit | Attending: Interventional Cardiology | Admitting: Interventional Cardiology

## 2014-09-21 ENCOUNTER — Telehealth: Payer: Self-pay | Admitting: Interventional Cardiology

## 2014-09-21 NOTE — Telephone Encounter (Signed)
New problem   Pt has hematoma on her rt leg due to injury. Pt wish to speak to nurse concerning this matter.

## 2014-09-21 NOTE — Telephone Encounter (Signed)
Spoke with pt and she informed me that she fell last week at her daughter's house. Pt states she missed the last step and fell just a short distance into gravel. Pt states that her right leg has a large hematoma that goes from her knee down to her ankle. Pt states that nurses/techs at cardiac rehab suggested she call because of the size. Pt states that the outer edges are a pinkish color and it appears to be getting better. Pt denies pain to the area but says that it is a little tender when she pushes on it. Informed pt that I would route this information to Dr. Irish Lack for review and advisement.

## 2014-09-22 ENCOUNTER — Encounter (HOSPITAL_COMMUNITY)
Admission: RE | Admit: 2014-09-22 | Discharge: 2014-09-22 | Disposition: A | Discharge status: Home / Self Care | Payer: Self-pay | Source: Ambulatory Visit | Attending: Interventional Cardiology | Admitting: Interventional Cardiology

## 2014-09-22 NOTE — Telephone Encounter (Signed)
As long as it is getting better, she can just protect the area and use warm compresses.  If she feels it is getting worse, let us know.

## 2014-09-22 NOTE — Telephone Encounter (Signed)
LMTCB

## 2014-09-25 ENCOUNTER — Encounter (HOSPITAL_COMMUNITY)
Admission: RE | Admit: 2014-09-25 | Discharge: 2014-09-25 | Disposition: A | Discharge status: Home / Self Care | Payer: Self-pay | Source: Ambulatory Visit | Attending: Interventional Cardiology | Admitting: Interventional Cardiology

## 2014-09-25 NOTE — Telephone Encounter (Signed)
The pt states that her right leg is better at this time. She states that she has been applying ice to her injury from time to time. She is advised per Dr Irish Lack to protect the area and to use warm compresses. She is aware to call the office if she feels that it is getting worse. She verbalized understanding to all instructions given and expressed gratitude towards Dr Irish Lack for his concern and care.

## 2014-09-27 ENCOUNTER — Encounter (HOSPITAL_COMMUNITY): Payer: Self-pay

## 2014-09-27 ENCOUNTER — Other Ambulatory Visit: Payer: Self-pay | Admitting: Interventional Cardiology

## 2014-09-29 ENCOUNTER — Encounter (HOSPITAL_COMMUNITY)
Admission: RE | Admit: 2014-09-29 | Discharge: 2014-09-29 | Disposition: A | Discharge status: Home / Self Care | Payer: Self-pay | Source: Ambulatory Visit | Attending: Interventional Cardiology | Admitting: Interventional Cardiology

## 2014-10-02 ENCOUNTER — Encounter (HOSPITAL_COMMUNITY): Payer: Self-pay

## 2014-10-04 ENCOUNTER — Encounter (HOSPITAL_COMMUNITY): Payer: Self-pay

## 2014-10-05 IMAGING — CR DG CHEST 1V PORT
1 series · 1 of 1 positions shown · non-contrast
Comparison: 11/30/2012

CLINICAL DATA: Chest tightness.

EXAM:
PORTABLE CHEST - 1 VIEW

[AP]
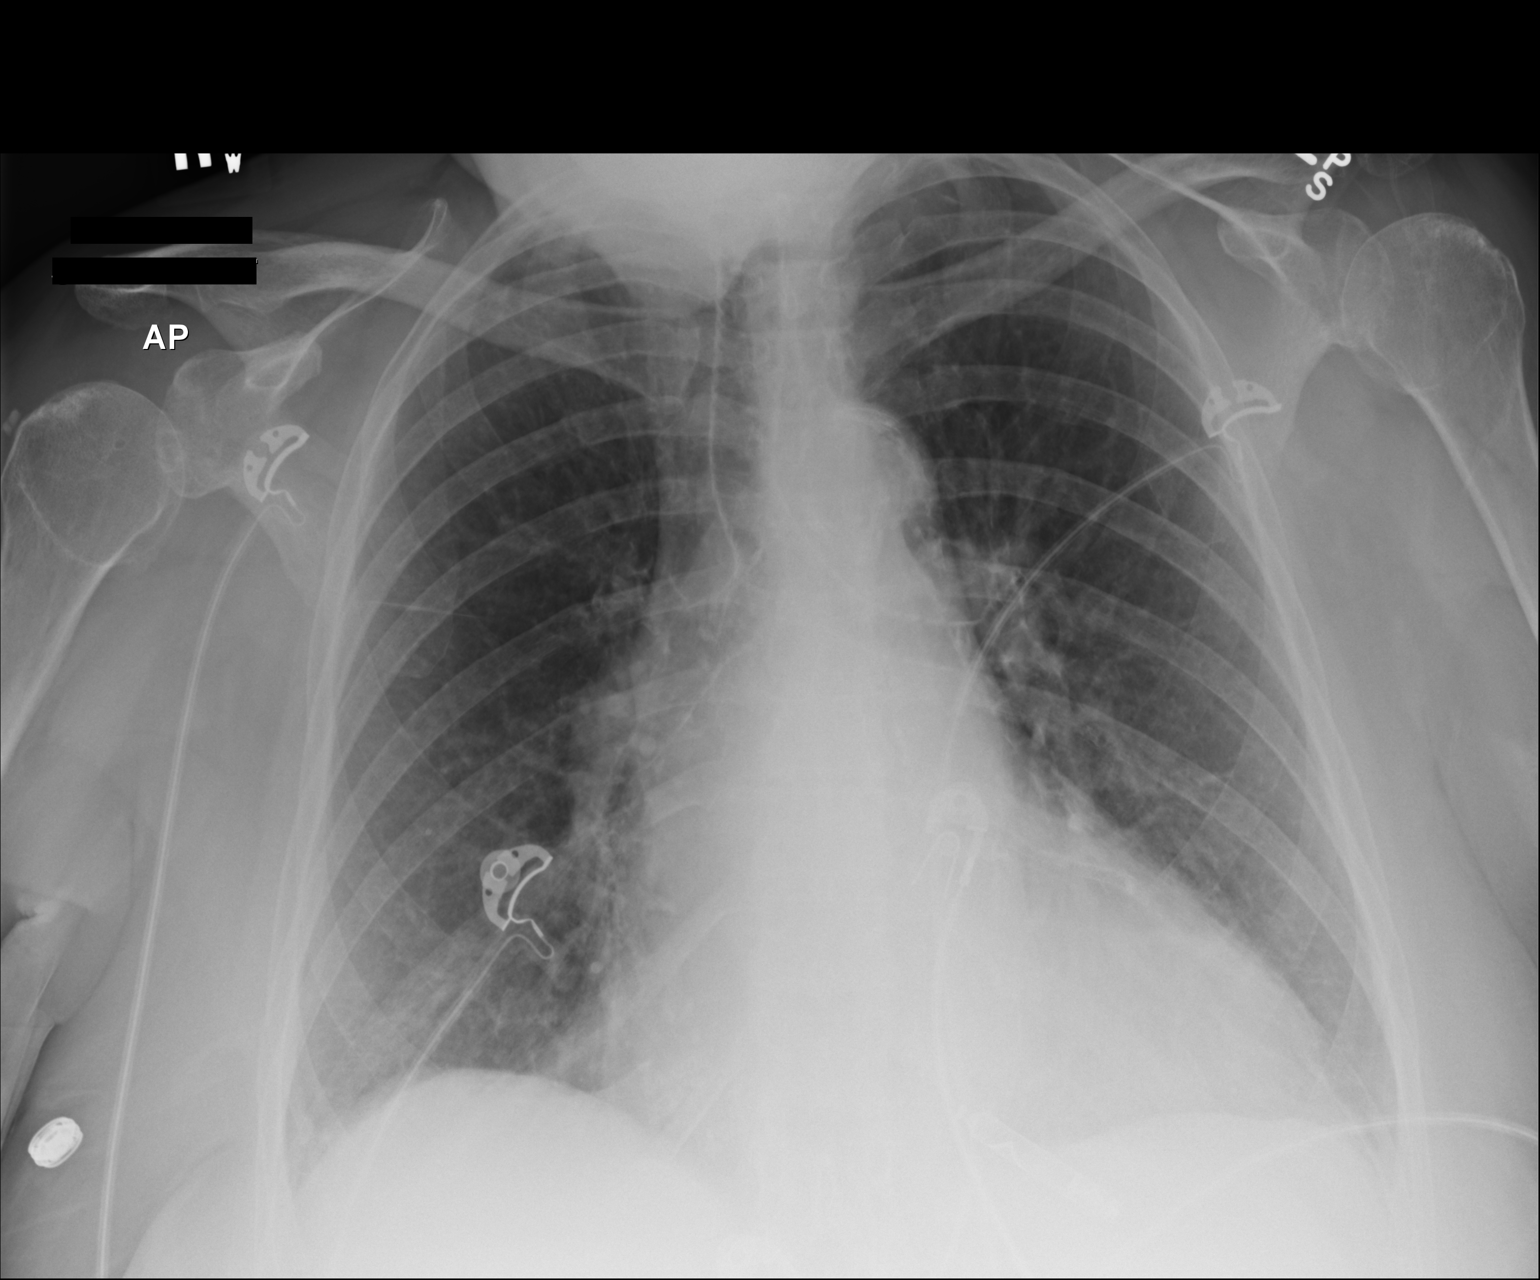

[1 of 1 positions shown; findings below may reference images not displayed]

FINDINGS: Heart is enlarged. There is no pulmonary edema. Lungs are clear.
Coronary stent is visible. Implanted loop recorder is again noted.
Visualized osseous structures have a normal appearance.
IMPRESSION: 1. Cardiomegaly without pulmonary edema.
2.  No focal acute pulmonary abnormality.

## 2014-10-06 ENCOUNTER — Encounter (HOSPITAL_COMMUNITY): Payer: Medicare Other

## 2014-10-10 ENCOUNTER — Ambulatory Visit (INDEPENDENT_AMBULATORY_CARE_PROVIDER_SITE_OTHER): Payer: Medicare Other | Admitting: *Deleted

## 2014-10-10 ENCOUNTER — Encounter: Payer: Self-pay | Admitting: Interventional Cardiology

## 2014-10-10 ENCOUNTER — Ambulatory Visit (INDEPENDENT_AMBULATORY_CARE_PROVIDER_SITE_OTHER): Payer: Medicare Other | Admitting: Interventional Cardiology

## 2014-10-10 VITALS — BP 128/52 | HR 50 | Ht 60.0 in | Wt 176.8 lb

## 2014-10-10 DIAGNOSIS — E785 Hyperlipidemia, unspecified: Secondary | ICD-10-CM

## 2014-10-10 DIAGNOSIS — Z5181 Encounter for therapeutic drug level monitoring: Secondary | ICD-10-CM

## 2014-10-10 DIAGNOSIS — I4892 Unspecified atrial flutter: Secondary | ICD-10-CM

## 2014-10-10 DIAGNOSIS — I2510 Atherosclerotic heart disease of native coronary artery without angina pectoris: Secondary | ICD-10-CM

## 2014-10-10 DIAGNOSIS — I25119 Atherosclerotic heart disease of native coronary artery with unspecified angina pectoris: Secondary | ICD-10-CM

## 2014-10-10 DIAGNOSIS — I4891 Unspecified atrial fibrillation: Secondary | ICD-10-CM | POA: Diagnosis not present

## 2014-10-10 DIAGNOSIS — I10 Essential (primary) hypertension: Secondary | ICD-10-CM | POA: Diagnosis not present

## 2014-10-10 DIAGNOSIS — M79604 Pain in right leg: Secondary | ICD-10-CM

## 2014-10-10 LAB — POCT INR: INR: 2.3

## 2014-10-10 NOTE — Patient Instructions (Addendum)
Medication Instructions:  Same-no change  Labwork: January 02, 2015. Do not eat or drink after midnight the night before labs are drawn.  Testing/Procedures: None  Follow-Up: Your physician wants you to follow-up in: 6 months. You will receive a reminder letter in the mail two months in advance. If you don't receive a letter, please call our office to schedule the follow-up appointment.

## 2014-10-10 NOTE — Progress Notes (Signed)
Patient ID: Patty Patty, female   DOB: [birth year], 81 y.o.   MRN: [id number]     Cardiology Office Note   Date:  10/10/2014   ID:  Patty Patty, DOB Sep 27, 1930, MRN [id number]  PCP:  No PCP Per Patient    No chief complaint on file.  F/u CAD  Wt Readings from Last 3 Encounters:  10/10/14 176 lb 12.8 oz (80.196 kg)  06/16/14 172 lb (78.019 kg)  06/13/14 173 lb (78.472 kg)       History of Present Illness: Patty Patty is a 81 y.o. female  with AFib and CAD. She had multivessel rotational atherectomy and stent placement in 7/14. She finished rehab without any problems. In 9/15, She had a DES to an area of ISR from an old Bare metal stent from 2005 in the LAD. She has had more chest discomfort at times. Her medicines were adjusted for better rate control.  Overall she has been better. She had a repeat cath in 11/15 and her LAD was unchanged. She was going to start cardiac rehab but because she reported some chest discomfort, they wanted her evaluated first.  She ultimately she had repeat cath and stenting of the prox LAD.  She has done well since that time.  No CP or palpitations.  SHe alternates exercise at the [facility] and cardiac rehab.  She feels well exercising.  SHe is getting 4-5 days of exercise per week.    Her oncologist has followed her for her abdominal cancer.  It has been inactive for a while.  She had a fall where she had severe bruising of the right leg.     Past Medical History  Diagnosis Date  . Hypertension   . Hyperlipidemia   . Obesity   . Diverticulitis   . Peripheral vascular disease   . Varicose veins   . Granulosa cell carcinoma     abd; last episode was in 2009  . Tachycardia-bradycardia syndrome     a. s/p leadless pacemaker ([name]) implanted by Dr [name]  . Permanent atrial fibrillation 2013  . OSA on CPAP   . Combined systolic and diastolic heart failure, NYHA class 3   . Coronary artery disease     a. s/p multiple stents  b. LHC  12/14/13 with sig CAD with TO of OM1, 70-80% occusion OM2, 50-70% mLCx, 50-70% pLAD (recent FFR 0.83), diffusely diseased RCA, widely patent stetns in pLAD and LCx.  Marland Kitchen LBBB (left bundle branch block)        . Myocardial infarction 2002  . Renal artery stenosis     Past Surgical History  Procedure Laterality Date  . Cholecystectomy  1980's  . Salivary gland surgery  2000's    "had a little lump removed; granulosa related; it was benign" (08/11/2012)  . Abdominal hysterectomy    . Colon surgery  2004    colectomy for diverticulosis  . Granulosa tumor excision  2000; 2003; 2004; 2007    "all in my abdomen including small intestines, outside my ?uterus/etc" (08/11/2012)  . Tee without cardioversion  12/31/2011    Procedure: TRANSESOPHAGEAL ECHOCARDIOGRAM (TEE);  Surgeon: [name], MD;  Location: [place];  Service: Cardiovascular;  Laterality: N/A;  . Cardioversion  12/31/2011    Procedure: CARDIOVERSION;  Surgeon: [name], MD;  Location: [place];  Service: Cardiovascular;  Laterality: N/A;  . Cardiac catheterization  09/03/2007    EF 70%; Failed attempt at PCI to OM  . Cardiac catheterization  11/01/2003    EF 70%  . Coronary angioplasty with stent placement  2000; 08/11/2012; 11/12/2012    3 + 2 LAD & CFX; 2nd CFX stent 11/12/2012  . Varicose vein surgery Bilateral 1977  . Hernia repair  2005    "laparoscopic"  . Cataract extraction, bilateral  2015  . Cardioversion N/A 12/31/2011    Procedure: CARDIOVERSION;  Surgeon: [name], MD;  Location: [facility] CATH LAB;  Service: Cardiovascular;  Laterality: N/A;  . Permanent pacemaker insertion N/A 03/16/2012    [name] (SJM) leadless pacemaker (LEADLESS II STUDY PATEINT)  . Percutaneous coronary intervention-balloon only  08/04/2012    Procedure: PERCUTANEOUS CORONARY INTERVENTION-BALLOON ONLY;  Surgeon: [name], MD;  Location: [facility] CATH LAB;  Service: Cardiovascular;;  . Percutaneous coronary  rotoblator intervention (pci-r) N/A 08/11/2012    Procedure: PERCUTANEOUS CORONARY ROTOBLATOR INTERVENTION (PCI-R);  Surgeon: [name], MD;  Location: [facility] CATH LAB;  Service: Cardiovascular;  Laterality: N/A;  . Left heart catheterization with coronary angiogram N/A 11/12/2012    Procedure: LEFT HEART CATHETERIZATION WITH CORONARY ANGIOGRAM;  Surgeon: [name], MD;  Location: [facility] CATH LAB;  Service: Cardiovascular;  Laterality: N/A;  . Left heart catheterization with coronary angiogram N/A 10/07/2013    Procedure: LEFT HEART CATHETERIZATION WITH CORONARY ANGIOGRAM;  Surgeon: [name], MD;  Location: [facility] CATH LAB;  Service: Cardiovascular;  Laterality: N/A;  . Percutaneous coronary stent intervention (pci-s)  10/07/2013    Procedure: PERCUTANEOUS CORONARY STENT INTERVENTION (PCI-S);  Surgeon: [name], MD;  Location: [facility] CATH LAB;  Service: Cardiovascular;;  . Fractional flow reserve wire  10/07/2013    Procedure: FRACTIONAL FLOW RESERVE WIRE;  Surgeon: [name], MD;  Location: [facility] CATH LAB;  Service: Cardiovascular;;  . Left heart catheterization with coronary angiogram N/A 12/14/2013    Procedure: LEFT HEART CATHETERIZATION WITH CORONARY ANGIOGRAM;  Surgeon: Sinclair Grooms, MD;  Location: [facility] CATH LAB;  Service: Cardiovascular;  Laterality: N/A;  . Left heart catheterization with coronary angiogram N/A 05/16/2014    Procedure: LEFT HEART CATHETERIZATION WITH CORONARY ANGIOGRAM;  Surgeon: Sherren Mocha, MD;  Location: [facility] CATH LAB;  Service: Cardiovascular;  Laterality: N/A;     Current Outpatient Prescriptions  Medication Sig Dispense Refill  . atorvastatin (LIPITOR) 80 MG tablet TAKE 1 TABLET (80 MG TOTAL) BY MOUTH EVERY MORNING. 30 tablet 6  . beta carotene w/minerals (OCUVITE) tablet Take 2 tablets by mouth daily.    . Calcium Carb-Cholecalciferol (CALTRATE 600+D) 600-800 MG-UNIT TABS Take 1 tablet by mouth every morning.     . clopidogrel (PLAVIX)  75 MG tablet TAKE 1 TABLET BY MOUTH EVERY DAY WITH BREAKFAST 30 tablet 1  . Coenzyme Q-10 100 MG capsule Take 200 mg by mouth daily.     Marland Kitchen diltiazem (CARDIZEM CD) 180 MG 24 hr capsule Take 1 capsule (180 mg total) by mouth daily. 90 capsule 3  . furosemide (LASIX) 40 MG tablet TAKE TWO TABLETS (80MG) EVERY MORNING AND 1 TABLET (40MG) EVERY EVENING AS NEEDED 90 tablet 5  . isosorbide mononitrate (IMDUR) 60 MG 24 hr tablet TAKE 1 TABLET BY MOUTH EVERY MORNING AND 1/2 TABLET EVERY EVENING 45 tablet 6  . lisinopril (PRINIVIL,ZESTRIL) 5 MG tablet Take 1 tablet (5 mg total) by mouth 2 (two) times daily. 60 tablet 6  . metoprolol tartrate (LOPRESSOR) 100 MG tablet Take 1 tablet (100 mg total) by mouth 2 (two) times daily. 60 tablet 11  . Multiple Vitamin (MULTIVITAMIN) capsule Take  1 capsule by mouth daily.    Marland Kitchen NITROSTAT 0.4 MG SL tablet PLACE 1 TABLET (0.4 MG TOTAL) UNDER THE TONGUE EVERY 5 (FIVE) MINUTES AS NEEDED. FOR CHEST PAIN 25 tablet 3  . Omega-3 Fatty Acids (FISH OIL PO) Take 1,200 mg by mouth daily.     . potassium chloride SA (K-DUR,KLOR-CON) 20 MEQ tablet Take 20 mEq by mouth daily.    Marland Kitchen spironolactone (ALDACTONE) 25 MG tablet Take 12.5 mg by mouth at bedtime.     Marland Kitchen warfarin (COUMADIN) 2 MG tablet Take 0.5-1 tablets (1-2 mg total) by mouth daily. As directed 30 tablet 3   No current facility-administered medications for this visit.    Allergies:   Bee venom and Other    Social History:  The patient  reports that she quit smoking about 40 years ago. Her smoking use included Cigarettes. She has a 32 pack-year smoking history. She has never used smokeless tobacco. She reports that she drinks about 3.0 oz of alcohol per week. She reports that she does not use illicit drugs.   Family History:  The patient's family history includes Diabetes in her brother and father; Heart attack in her brother and father; Hypertension in her brother and father; Kidney failure in her brother; Stroke in her  mother.    ROS:  Please see the history of present illness.   Otherwise, review of systems are positive for resolution of chest pain.  Chronic twitching.   All other systems are reviewed and negative.    PHYSICAL EXAM: VS:  BP 128/52 mmHg  Pulse 50  Ht 5' (1.524 m)  Wt 176 lb 12.8 oz (80.196 kg)  BMI 34.53 kg/m2  SpO2 97% , BMI Body mass index is 34.53 kg/(m^2). GEN: Well nourished, well developed, in no acute distress HEENT: normal Neck: no JVD, carotid bruits, or masses Cardiac: irregularly irregular; 2/6 systolic murmur, rubs, or gallops,no edema  Respiratory:  clear to auscultation bilaterally, normal work of breathing GI: soft, nontender, nondistended, + BS MS: no deformity or atrophy Skin: warm and dry, no rash; discoloration of right lower extremity with 2 firm areas where she had hematoma. Neuro:  Strength and sensation are intact Psych: euthymic mood, full affect    Recent Labs: 01/05/2014: Pro B Natriuretic peptide (BNP) 1341.0* 01/13/2014: TSH 4.65* 05/14/2014: B Natriuretic Peptide 386.6* 06/13/2014: ALT 20; BUN 29.4*; Creatinine 1.3*; HGB 12.9; Platelets 196; Potassium 4.1; Sodium 142   Lipid Panel    Component Value Date/Time   CHOL 125 01/06/2014 0021   TRIG 70 01/06/2014 0021   HDL 64 01/06/2014 0021   CHOLHDL 2.0 01/06/2014 0021   VLDL 14 01/06/2014 0021   LDLCALC 47 01/06/2014 0021     Other studies Reviewed: Additional studies/ records that were reviewed today with results demonstrating: cath report showing prox LAD stent placement in 2016.   ASSESSMENT AND PLAN:  1. CAD: No angina since last PCI. Continue aggressive secondary prevention. 2. Atrial fibrillation: Rate controlled. Coumadin for stroke prevention. No bleeding problems. We'll check labs in December including lipids, CBC, cemented and TSH. She is looking for a new primary care doctor. 3. She may want to go back to the maintenance phase of cardiac rehabilitation in a few months. She likes  to alternate between the [facility] and cardiac rehabilitation. Whenever she calls and requests to go back to rehabilitation, we can put in the referral. 4. Chronic diastolic heart failure: Euvolemic at this point. Continue furosemide. 5.     She can  try warm compresses for the firm area on her right leg.  Current medicines are reviewed at length with the patient today.  The patient concerns regarding her medicines were addressed.  The following changes have been made:  No change  Labs/ tests ordered today include:   Orders Placed This Encounter  Procedures  . CBC with Differential  . Comp Met (CMET)  . Lipid Profile  . TSH    Recommend 150 minutes/week of aerobic exercise Low fat, low carb, high fiber diet recommended  Disposition:   FU in 6 months   Teresita Madura., MD  10/10/2014 2:35 PM    [facility] [place], [place], [place]  [address] Phone: [phone]; Fax: [phone]

## 2014-10-11 ENCOUNTER — Encounter (HOSPITAL_COMMUNITY): Payer: Medicare Other

## 2014-10-11 ENCOUNTER — Ambulatory Visit (INDEPENDENT_AMBULATORY_CARE_PROVIDER_SITE_OTHER): Payer: Medicare Other | Admitting: Internal Medicine

## 2014-10-11 ENCOUNTER — Encounter: Payer: Self-pay | Admitting: Internal Medicine

## 2014-10-11 VITALS — BP 126/58 | HR 75 | Ht 60.0 in | Wt 177.4 lb

## 2014-10-11 DIAGNOSIS — I4892 Unspecified atrial flutter: Secondary | ICD-10-CM | POA: Diagnosis not present

## 2014-10-11 DIAGNOSIS — I4891 Unspecified atrial fibrillation: Secondary | ICD-10-CM | POA: Diagnosis not present

## 2014-10-11 DIAGNOSIS — I10 Essential (primary) hypertension: Secondary | ICD-10-CM

## 2014-10-11 DIAGNOSIS — I495 Sick sinus syndrome: Secondary | ICD-10-CM | POA: Diagnosis not present

## 2014-10-11 DIAGNOSIS — I2510 Atherosclerotic heart disease of native coronary artery without angina pectoris: Secondary | ICD-10-CM

## 2014-10-11 DIAGNOSIS — I25119 Atherosclerotic heart disease of native coronary artery with unspecified angina pectoris: Secondary | ICD-10-CM

## 2014-10-11 LAB — CUP PACEART INCLINIC DEVICE CHECK
Date Time Interrogation Session: 20160907145003
Lead Channel Pacing Threshold Amplitude: 0.75 V
Lead Channel Setting Pacing Amplitude: 2.5 V
MDC IDC MSMT LEADCHNL RV IMPEDANCE VALUE: 400 Ohm
MDC IDC MSMT LEADCHNL RV PACING THRESHOLD PULSEWIDTH: 0.4 ms
MDC IDC MSMT LEADCHNL RV SENSING INTR AMPL: 5 mV
MDC IDC PG SERIAL: 1096
MDC IDC SET LEADCHNL RV PACING PULSEWIDTH: 0.4 ms
MDC IDC SET LEADCHNL RV SENSING SENSITIVITY: 2 mV

## 2014-10-11 NOTE — Patient Instructions (Signed)
Medication Instructions:  Your physician recommends that you continue on your current medications as directed. Please refer to the Current Medication list given to you today.   Labwork: None ordered  Testing/Procedures: None ordered  Follow-Up: Your physician wants you to follow-up in: 6 months with Tommye Standard, EP PA and 12 months with Dr Vallery Ridge will receive a reminder letter in the mail two months in advance. If you don't receive a letter, please call our office to schedule the follow-up appointment.   Any Other Special Instructions Will Be Listed Below (If Applicable).

## 2014-10-11 NOTE — Progress Notes (Signed)
Electrophysiology Office Note   Date:  10/11/2014   ID:  Patty [name], DOB [birth year], MRN [id number]  Cardiologist:  Dr Irish Lack Primary Electrophysiologist: Thompson Grayer, MD    Chief Complaint  Patient presents with  . Atrial Fibrillation     History of Present Illness: Patty [name] is a 81 y.o. female who presents today for electrophysiology evaluation.   She presents today for device follow-up.  She is doing well.   She is mostly unaware of her afib.   Today, she denies symptoms of palpitations, chest pain, shortness of breath, orthopnea, PND, lower extremity edema, claudication, dizziness, presyncope, syncope, bleeding, or neurologic sequela. The patient is tolerating medications without difficulties and is otherwise without complaint today.    Past Medical History  Diagnosis Date  . Hypertension   . Hyperlipidemia   . Obesity   . Diverticulitis   . Peripheral vascular disease   . Varicose veins   . Granulosa cell carcinoma     abd; last episode was in 2009  . Tachycardia-bradycardia syndrome     a. s/p leadless pacemaker ([name]) implanted by Dr [name]  . Permanent atrial fibrillation 2013  . OSA on CPAP   . Combined systolic and diastolic heart failure, NYHA class 3   . Coronary artery disease     a. s/p multiple stents  b. LHC 12/14/13 with sig CAD with TO of OM1, 70-80% occusion OM2, 50-70% mLCx, 50-70% pLAD (recent FFR 0.83), diffusely diseased RCA, widely patent stetns in pLAD and LCx.  Marland Kitchen LBBB (left bundle branch block)        . Myocardial infarction 2002  . Renal artery stenosis    Past Surgical History  Procedure Laterality Date  . Cholecystectomy  1980's  . Salivary gland surgery  2000's    "had a little lump removed; granulosa related; it was benign" (08/11/2012)  . Abdominal hysterectomy    . Colon surgery  2004    colectomy for diverticulosis  . Granulosa tumor excision  2000; 2003; 2004; 2007    "all in my abdomen including small  intestines, outside my ?uterus/etc" (08/11/2012)  . Tee without cardioversion  12/31/2011    Procedure: TRANSESOPHAGEAL ECHOCARDIOGRAM (TEE);  Surgeon: [name], MD;  Location: [place];  Service: Cardiovascular;  Laterality: N/A;  . Cardioversion  12/31/2011    Procedure: CARDIOVERSION;  Surgeon: [name], MD;  Location: [place];  Service: Cardiovascular;  Laterality: N/A;  . Cardiac catheterization  09/03/2007    EF 70%; Failed attempt at PCI to OM  . Cardiac catheterization  11/01/2003    EF 70%  . Coronary angioplasty with stent placement  2000; 08/11/2012; 11/12/2012    3 + 2 LAD & CFX; 2nd CFX stent 11/12/2012  . Varicose vein surgery Bilateral 1977  . Hernia repair  2005    "laparoscopic"  . Cataract extraction, bilateral  2015  . Cardioversion N/A 12/31/2011    Procedure: CARDIOVERSION;  Surgeon: [name], MD;  Location: [facility] CATH LAB;  Service: Cardiovascular;  Laterality: N/A;  . Permanent pacemaker insertion N/A 03/16/2012    [name] (SJM) leadless pacemaker (LEADLESS II STUDY PATEINT)  . Percutaneous coronary intervention-balloon only  08/04/2012    Procedure: PERCUTANEOUS CORONARY INTERVENTION-BALLOON ONLY;  Surgeon: [name], MD;  Location: [facility] CATH LAB;  Service: Cardiovascular;;  . Percutaneous coronary rotoblator intervention (pci-r) N/A 08/11/2012    Procedure: PERCUTANEOUS CORONARY ROTOBLATOR INTERVENTION (PCI-R);  Surgeon: [name], MD;  Location: [facility] CATH LAB;  Service: Cardiovascular;  Laterality: N/A;  . Left heart catheterization with coronary angiogram N/A 11/12/2012    Procedure: LEFT HEART CATHETERIZATION WITH CORONARY ANGIOGRAM;  Surgeon: [name], MD;  Location: [facility] CATH LAB;  Service: Cardiovascular;  Laterality: N/A;  . Left heart catheterization with coronary angiogram N/A 10/07/2013    Procedure: LEFT HEART CATHETERIZATION WITH CORONARY ANGIOGRAM;  Surgeon: [name], MD;  Location: [facility]  CATH LAB;  Service: Cardiovascular;  Laterality: N/A;  . Percutaneous coronary stent intervention (pci-s)  10/07/2013    Procedure: PERCUTANEOUS CORONARY STENT INTERVENTION (PCI-S);  Surgeon: [name], MD;  Location: [facility] CATH LAB;  Service: Cardiovascular;;  . Fractional flow reserve wire  10/07/2013    Procedure: FRACTIONAL FLOW RESERVE WIRE;  Surgeon: [name], MD;  Location: [facility] CATH LAB;  Service: Cardiovascular;;  . Left heart catheterization with coronary angiogram N/A 12/14/2013    Procedure: LEFT HEART CATHETERIZATION WITH CORONARY ANGIOGRAM;  Surgeon: Sinclair Grooms, MD;  Location: [facility] CATH LAB;  Service: Cardiovascular;  Laterality: N/A;  . Left heart catheterization with coronary angiogram N/A 05/16/2014    Procedure: LEFT HEART CATHETERIZATION WITH CORONARY ANGIOGRAM;  Surgeon: Sherren Mocha, MD;  Location: [facility] CATH LAB;  Service: Cardiovascular;  Laterality: N/A;     Current Outpatient Prescriptions  Medication Sig Dispense Refill  . atorvastatin (LIPITOR) 80 MG tablet TAKE 1 TABLET (80 MG TOTAL) BY MOUTH EVERY MORNING. 30 tablet 6  . beta carotene w/minerals (OCUVITE) tablet Take 2 tablets by mouth daily.    . Calcium Carb-Cholecalciferol (CALTRATE 600+D) 600-800 MG-UNIT TABS Take 1 tablet by mouth every morning.     . clopidogrel (PLAVIX) 75 MG tablet TAKE 1 TABLET BY MOUTH EVERY DAY WITH BREAKFAST 30 tablet 1  . Coenzyme Q-10 100 MG capsule Take 100 mg by mouth daily.     Marland Kitchen diltiazem (CARDIZEM CD) 180 MG 24 hr capsule Take 1 capsule (180 mg total) by mouth daily. 90 capsule 3  . furosemide (LASIX) 40 MG tablet Take 40 mg by mouth 2 (two) times daily.    . isosorbide mononitrate (IMDUR) 60 MG 24 hr tablet TAKE 1 TABLET BY MOUTH EVERY MORNING AND 1/2 TABLET EVERY EVENING 45 tablet 6  . lisinopril (PRINIVIL,ZESTRIL) 5 MG tablet Take 1 tablet (5 mg total) by mouth 2 (two) times daily. 60 tablet 6  . metoprolol tartrate (LOPRESSOR) 100 MG tablet Take 1 tablet (100  mg total) by mouth 2 (two) times daily. 60 tablet 11  . Multiple Vitamin (MULTIVITAMIN) capsule Take 1 capsule by mouth daily.    Marland Kitchen NITROSTAT 0.4 MG SL tablet PLACE 1 TABLET (0.4 MG TOTAL) UNDER THE TONGUE EVERY 5 (FIVE) MINUTES AS NEEDED. FOR CHEST PAIN 25 tablet 3  . Omega-3 Fatty Acids (FISH OIL PO) Take 1,200 mg by mouth daily.     . potassium chloride SA (K-DUR,KLOR-CON) 20 MEQ tablet Take 20 mEq by mouth daily.    Marland Kitchen spironolactone (ALDACTONE) 25 MG tablet Take 12.5 mg by mouth at bedtime.     Marland Kitchen warfarin (COUMADIN) 2 MG tablet Take 0.5-1 tablets (1-2 mg total) by mouth daily. As directed 30 tablet 3   No current facility-administered medications for this visit.    Allergies:   Bee venom and Other   Social History:  The patient  reports that she quit smoking about 40 years ago. Her smoking use included Cigarettes. She has a 32 pack-year smoking history. She has never used smokeless tobacco. She reports that she  drinks about 3.0 oz of alcohol per week. She reports that she does not use illicit drugs.   Family History:  The patient's family history includes Diabetes in her brother and father; Heart attack in her brother and father; Hypertension in her brother and father; Kidney failure in her brother; Stroke in her mother.    ROS:  Please see the history of present illness.   All other systems are reviewed and negative.    PHYSICAL EXAM: VS:  BP 126/58 mmHg  Pulse 75  Ht 5' (1.524 m)  Wt 80.468 kg (177 lb 6.4 oz)  BMI 34.65 kg/m2  SpO2 96% , BMI Body mass index is 34.65 kg/(m^2). GEN: Well nourished, well developed, in no acute distress HEENT: normal Neck: no JVD, carotid bruits, or masses Cardiac: RRR (paced) Respiratory:  clear to auscultation bilaterally, normal work of breathing GI: soft, nontender, nondistended, + BS MS: no deformity or atrophy Skin: warm and dry  Neuro:  Strength and sensation are intact Psych: euthymic mood, full affect  EKG:  EKG is ordered  today. The ekg ordered today shows afib with V pacing  Device interrogation is reviewed today in detail.  See PaceArt for details.   Recent Labs: 01/05/2014: Pro B Natriuretic peptide (BNP) 1341.0* 01/13/2014: TSH 4.65* 05/14/2014: B Natriuretic Peptide 386.6* 06/13/2014: ALT 20; BUN 29.4*; Creatinine 1.3*; HGB 12.9; Platelets 196; Potassium 4.1; Sodium 142    Lipid Panel     Component Value Date/Time   CHOL 125 01/06/2014 0021   TRIG 70 01/06/2014 0021   HDL 64 01/06/2014 0021   CHOLHDL 2.0 01/06/2014 0021   VLDL 14 01/06/2014 0021   LDLCALC 47 01/06/2014 0021     Wt Readings from Last 3 Encounters:  10/11/14 80.468 kg (177 lb 6.4 oz)  10/10/14 80.196 kg (176 lb 12.8 oz)  06/16/14 78.019 kg (172 lb)      Other studies Reviewed: Additional studies/ records that were reviewed today include: cath report 4/16 Review of the above records today demonstrates: V pacing at 50 bpm   ASSESSMENT AND PLAN:  1.  Persistent afib Appropriately anticoagulated for chads2vasc score of at least 5 Rates are recently much better controlled (see below)  2. Bradycardia Normal pacemaker function See Pace Art report No changes today  3. CAD  No ischemic symptoms Appears to do better with slower heart rates.   4. HTN Stable No change required today   Current medicines are reviewed at length with the patient today.   The patient does not have concerns regarding her medicines.  The following changes were made today:  none  Follow-up with EP NP in 6 months,  I will see in a year  Signed, Thompson Grayer, MD  10/11/2014 11:28 PM     [place] [address] [place] [address] [phone] (office) [phone] (fax)

## 2014-10-13 ENCOUNTER — Encounter (HOSPITAL_COMMUNITY): Payer: Medicare Other

## 2014-10-16 ENCOUNTER — Encounter (HOSPITAL_COMMUNITY): Payer: Medicare Other

## 2014-10-18 ENCOUNTER — Encounter (HOSPITAL_COMMUNITY): Payer: Medicare Other

## 2014-10-20 ENCOUNTER — Encounter (HOSPITAL_COMMUNITY): Payer: Medicare Other

## 2014-10-23 ENCOUNTER — Encounter (HOSPITAL_COMMUNITY): Payer: Medicare Other

## 2014-10-25 ENCOUNTER — Encounter (HOSPITAL_COMMUNITY): Payer: Medicare Other

## 2014-10-27 ENCOUNTER — Encounter (HOSPITAL_COMMUNITY): Payer: Medicare Other

## 2014-10-30 ENCOUNTER — Encounter (HOSPITAL_COMMUNITY): Payer: Medicare Other

## 2014-10-30 ENCOUNTER — Telehealth: Payer: Self-pay | Admitting: Interventional Cardiology

## 2014-10-30 NOTE — Telephone Encounter (Signed)
Pt states since being seen by Dr. Irish Lack and Dr. [name] at the beginning of Sept, her HR has been staying right at 50-51. Denies lightheadedness, dizziness, CP, SOB. Does c/o low energy. Pt states Dr. [name] mentioned possibly adjusting her pacemaker rate. BP's been right around 118/48. Pt wanted to make both physicians aware and see if they thought any changes needed to be made.

## 2014-10-30 NOTE — Telephone Encounter (Signed)
New Message       Pt calling stating that her HR has been at 50 and 51 for the past 10 days. Please call back and advise.

## 2014-11-01 ENCOUNTER — Encounter: Payer: Self-pay | Admitting: Podiatry

## 2014-11-01 ENCOUNTER — Ambulatory Visit (INDEPENDENT_AMBULATORY_CARE_PROVIDER_SITE_OTHER): Payer: Medicare Other | Admitting: Podiatry

## 2014-11-01 ENCOUNTER — Encounter (HOSPITAL_COMMUNITY): Payer: Medicare Other

## 2014-11-01 DIAGNOSIS — M722 Plantar fascial fibromatosis: Secondary | ICD-10-CM

## 2014-11-01 MED ORDER — TRIAMCINOLONE ACETONIDE 10 MG/ML IJ SUSP
10.0000 mg | Freq: Once | INTRAMUSCULAR | Status: AC
  Administered 2014-11-01: 10 mg

## 2014-11-01 NOTE — Progress Notes (Signed)
Subjective:     Patient ID: Patty [name], female   DOB: [birth year], 81 y.o.   MRN: [id number]  HPI patient states my heel was doing good and then 2 weeks ago it started to hurt me quite a bit again   Review of Systems     Objective:   Physical Exam Neurovascular status intact muscle strength adequate with discomfort in the plantar heel right at the insertional point tendon into the calcaneus    Assessment:     Plantar fasciitis right with inflammation and fluid buildup    Plan:     Reinjected the plantar fascia 3 Milligan Kenalog 5 mill grams Xylocaine and advised on supportive shoe physical therapy and reappoint if symptoms were to reoccur

## 2014-11-01 NOTE — Telephone Encounter (Signed)
Continue to  Monitor.  See if Dr. [name] wants to change pacer rate.

## 2014-11-01 NOTE — Telephone Encounter (Signed)
Spoke with pt and advised her that Dr. Irish Lack would like for her to continue to monitor the low energy and HR. Advised pt that we are waiting to hear back from Dr. [name] in regards to adjusting pacer rate. Pt verbalized understanding and was appreciative for call.

## 2014-11-03 ENCOUNTER — Encounter (HOSPITAL_COMMUNITY): Payer: Medicare Other

## 2014-11-06 ENCOUNTER — Other Ambulatory Visit: Payer: Self-pay | Admitting: Interventional Cardiology

## 2014-11-06 ENCOUNTER — Encounter (HOSPITAL_COMMUNITY): Payer: Medicare Other

## 2014-11-08 ENCOUNTER — Encounter (HOSPITAL_COMMUNITY): Payer: Medicare Other

## 2014-11-09 DIAGNOSIS — I481 Persistent atrial fibrillation: Secondary | ICD-10-CM | POA: Diagnosis not present

## 2014-11-09 DIAGNOSIS — G4733 Obstructive sleep apnea (adult) (pediatric): Secondary | ICD-10-CM | POA: Diagnosis not present

## 2014-11-09 DIAGNOSIS — E782 Mixed hyperlipidemia: Secondary | ICD-10-CM | POA: Diagnosis not present

## 2014-11-09 DIAGNOSIS — I252 Old myocardial infarction: Secondary | ICD-10-CM | POA: Diagnosis not present

## 2014-11-09 DIAGNOSIS — I10 Essential (primary) hypertension: Secondary | ICD-10-CM | POA: Diagnosis not present

## 2014-11-10 ENCOUNTER — Telehealth: Payer: Self-pay | Admitting: Interventional Cardiology

## 2014-11-10 ENCOUNTER — Encounter (HOSPITAL_COMMUNITY): Payer: Medicare Other

## 2014-11-10 NOTE — Telephone Encounter (Signed)
New Message   Pt states her hr is at 56 for her heart rate and she states   that RN is aware; and that she would like to speak to the RN

## 2014-11-10 NOTE — Telephone Encounter (Signed)
Calling stating her HR has still been running at 50. States she saw Patty [name] yesterday and HR was 50. States she feels fine.  Denies CP and SOB.  Only complaint is just feeling very tired.  She goes to silver sneakers to work out and after exercising HR is still 50.  Patty [name] states that Patty [name] had suggested that she watch her HR for a month and if was still at 50 may consider increasing pacer rate from 50 to 60.  Patty [name], Patty [name] nurse spoke w/him this AM and he suggested that she can come in for adjustment. Pt states she had it increased to 60 once before and had some CP but she thinks that it may have been due to medication changes. Now she states she has had some periodic CP even with pacer set at 50. Last time had CP was during the summer.  She is wondering if her medications should be adjusted. Spoke w/Charles in Device and scheduled her to come in on 10/19 and Patty [name] will also be in office that day. She verbalizes understanding and will come in on 19th.

## 2014-11-13 ENCOUNTER — Encounter (HOSPITAL_COMMUNITY): Payer: Medicare Other

## 2014-11-13 NOTE — Telephone Encounter (Signed)
Spoke with Dr Irish Lack.  Will try to let heart rate increase a little.  OK to stop diltiazem.  She should notify us should angina worsen.

## 2014-11-14 NOTE — Telephone Encounter (Signed)
Left a message for patient to return my call. 

## 2014-11-14 NOTE — Telephone Encounter (Signed)
Spoke with patient and she is going to stop Diltiazem and let us know how she does.  She has been at 35 for over a year. She did have angina Sun and took NTG and rested and all subsided  She will call me back if stopping the medications causes problems

## 2014-11-15 ENCOUNTER — Encounter (HOSPITAL_COMMUNITY): Payer: Medicare Other

## 2014-11-16 ENCOUNTER — Telehealth: Payer: Self-pay | Admitting: Internal Medicine

## 2014-11-16 NOTE — Telephone Encounter (Signed)
This encounter was created in error - please disregard.

## 2014-11-16 NOTE — Telephone Encounter (Signed)
Left message for patient that she could cancel appointment if she felt unnecessary.  I have asked her to call me and let me know what she is going to do

## 2014-11-16 NOTE — Addendum Note (Signed)
Addended by: Janan Halter F on: 11/16/2014 11:16 AM   Modules accepted: Level of Service, SmartSet

## 2014-11-16 NOTE — Telephone Encounter (Signed)
Left message for patient that I would discuss with research but if she is feeling better then we can cancel for now.  I have contacted research and this appointment was all for her as she felt her HR was too slow.  We have since stopped her Diltiazem, if she is feeling better okay to cancel and she can call back if needs to come in

## 2014-11-16 NOTE — Telephone Encounter (Signed)
New Message  Pt called request a call back to determine if the 10/19 appt is still needed. Please call

## 2014-11-16 NOTE — Telephone Encounter (Signed)
New Message  General message   Pt req a call back to determine if she will still need the appt on 11/22/2014. Please advise

## 2014-11-17 ENCOUNTER — Encounter (HOSPITAL_COMMUNITY): Payer: Medicare Other

## 2014-11-17 MED ORDER — DILTIAZEM HCL ER COATED BEADS 180 MG PO CP24: 180.0000 mg | ORAL_CAPSULE | Freq: Every day | ORAL | Status: DC

## 2014-11-17 NOTE — Telephone Encounter (Signed)
Pt c/o of Chest Pain: STAT if CP now or developed within 24 hours  1. Are you having CP right now? no  2. Are you experiencing any other symptoms (ex. SOB, nausea, vomiting, sweating)?light headed  3. How long have you been experiencing CP?  10:30pm 10/13   4. Is your CP continuous or coming and going? Coming and going  5. Have you taken Nitroglycerin? yes

## 2014-11-17 NOTE — Telephone Encounter (Signed)
Started having CP around 10:30 and she realized she was in afib and her heart racing.  She took NTG and that helped but she is going to restart the Diltiazem that was stopped due to her HR being 50.  Her HR has been 50 for over a year and any time we make changes she has CP.  She is going to keep her appointment in the device clinic for next week and call back if she has problems before.  She verbalized understanding and agrees with plan

## 2014-11-20 ENCOUNTER — Encounter (HOSPITAL_COMMUNITY): Payer: Medicare Other

## 2014-11-22 ENCOUNTER — Encounter (HOSPITAL_COMMUNITY): Payer: Medicare Other

## 2014-11-22 ENCOUNTER — Encounter: Payer: Self-pay | Admitting: Internal Medicine

## 2014-11-22 ENCOUNTER — Ambulatory Visit (INDEPENDENT_AMBULATORY_CARE_PROVIDER_SITE_OTHER): Payer: Medicare Other | Admitting: *Deleted

## 2014-11-22 ENCOUNTER — Ambulatory Visit (INDEPENDENT_AMBULATORY_CARE_PROVIDER_SITE_OTHER): Payer: Medicare Other | Admitting: Pharmacist

## 2014-11-22 DIAGNOSIS — I4892 Unspecified atrial flutter: Secondary | ICD-10-CM | POA: Diagnosis not present

## 2014-11-22 DIAGNOSIS — I4891 Unspecified atrial fibrillation: Secondary | ICD-10-CM | POA: Diagnosis not present

## 2014-11-22 DIAGNOSIS — I495 Sick sinus syndrome: Secondary | ICD-10-CM | POA: Diagnosis not present

## 2014-11-22 DIAGNOSIS — Z5181 Encounter for therapeutic drug level monitoring: Secondary | ICD-10-CM

## 2014-11-22 LAB — CUP PACEART INCLINIC DEVICE CHECK
Brady Statistic RV Percent Paced: 71 %
Lead Channel Pacing Threshold Pulse Width: 0.4 ms
Lead Channel Setting Pacing Amplitude: 2.5 V
Lead Channel Setting Pacing Pulse Width: 0.4 ms
MDC IDC MSMT BATTERY REMAINING LONGEVITY: 132 mo
MDC IDC MSMT BATTERY VOLTAGE: 3.3 V
MDC IDC MSMT LEADCHNL RV IMPEDANCE VALUE: 430 Ohm
MDC IDC MSMT LEADCHNL RV PACING THRESHOLD AMPLITUDE: 0.75 V
MDC IDC MSMT LEADCHNL RV SENSING INTR AMPL: 5.5 mV
MDC IDC SESS DTM: 20161019120759
MDC IDC SET LEADCHNL RV SENSING SENSITIVITY: 2 mV
Pulse Gen Serial Number: 1096

## 2014-11-22 LAB — POCT INR: INR: 2.9

## 2014-11-22 NOTE — Progress Notes (Signed)
Leadless pacemaker check in clinic Engineer, water). Normal device function. Threshold, sensing, and  impedance consistent with previous measurements. Device programmed to maximize longevity. Device programmed at appropriate safety margins. Device programmed to optimize intrinsic conduction. No changes made this session. Estimated longevity 10.8 years. Patient will follow up with RU and JA as scheduled.

## 2014-11-24 ENCOUNTER — Other Ambulatory Visit: Payer: Self-pay | Admitting: Interventional Cardiology

## 2014-11-24 ENCOUNTER — Encounter (HOSPITAL_COMMUNITY): Payer: Medicare Other

## 2014-11-27 ENCOUNTER — Encounter (HOSPITAL_COMMUNITY): Payer: Medicare Other

## 2014-11-29 ENCOUNTER — Encounter (HOSPITAL_COMMUNITY): Payer: Medicare Other

## 2014-12-01 ENCOUNTER — Encounter (HOSPITAL_COMMUNITY): Payer: Medicare Other

## 2014-12-04 ENCOUNTER — Encounter (HOSPITAL_COMMUNITY): Payer: Medicare Other

## 2014-12-06 ENCOUNTER — Encounter (HOSPITAL_COMMUNITY): Payer: Medicare Other

## 2014-12-08 ENCOUNTER — Telehealth: Payer: Self-pay | Admitting: *Deleted

## 2014-12-08 ENCOUNTER — Encounter (HOSPITAL_COMMUNITY): Payer: Medicare Other

## 2014-12-08 NOTE — Telephone Encounter (Signed)
Patient notified about potential battery malfunction in Leadless pacemaker due to dry battery cell. I asked patient to call Dr.Allred's office for appointment if she experiences lightheadness, dizziness or fainting.There has been no patient injury at this time but want to make patient aware of possibility. Patient verbalized understanding.

## 2014-12-11 ENCOUNTER — Encounter (HOSPITAL_COMMUNITY): Payer: Medicare Other

## 2014-12-13 ENCOUNTER — Other Ambulatory Visit: Payer: Self-pay | Admitting: Oncology

## 2014-12-13 ENCOUNTER — Encounter (HOSPITAL_COMMUNITY): Payer: Medicare Other

## 2014-12-13 DIAGNOSIS — C569 Malignant neoplasm of unspecified ovary: Secondary | ICD-10-CM

## 2014-12-13 DIAGNOSIS — Z23 Encounter for immunization: Secondary | ICD-10-CM | POA: Diagnosis not present

## 2014-12-14 ENCOUNTER — Other Ambulatory Visit (HOSPITAL_BASED_OUTPATIENT_CLINIC_OR_DEPARTMENT_OTHER): Payer: Medicare Other

## 2014-12-14 ENCOUNTER — Ambulatory Visit (HOSPITAL_BASED_OUTPATIENT_CLINIC_OR_DEPARTMENT_OTHER): Payer: Medicare Other | Admitting: Oncology

## 2014-12-14 ENCOUNTER — Encounter: Payer: Self-pay | Admitting: Oncology

## 2014-12-14 ENCOUNTER — Other Ambulatory Visit: Payer: Self-pay | Admitting: Oncology

## 2014-12-14 VITALS — BP 119/68 | HR 97 | Temp 97.8°F | Resp 18 | Ht 60.0 in | Wt 178.8 lb

## 2014-12-14 DIAGNOSIS — E785 Hyperlipidemia, unspecified: Secondary | ICD-10-CM | POA: Diagnosis not present

## 2014-12-14 DIAGNOSIS — I2510 Atherosclerotic heart disease of native coronary artery without angina pectoris: Secondary | ICD-10-CM

## 2014-12-14 DIAGNOSIS — Z7901 Long term (current) use of anticoagulants: Secondary | ICD-10-CM | POA: Diagnosis not present

## 2014-12-14 DIAGNOSIS — E669 Obesity, unspecified: Secondary | ICD-10-CM | POA: Diagnosis not present

## 2014-12-14 DIAGNOSIS — C569 Malignant neoplasm of unspecified ovary: Secondary | ICD-10-CM

## 2014-12-14 DIAGNOSIS — I4891 Unspecified atrial fibrillation: Secondary | ICD-10-CM

## 2014-12-14 DIAGNOSIS — I10 Essential (primary) hypertension: Secondary | ICD-10-CM

## 2014-12-14 LAB — CBC WITH DIFFERENTIAL/PLATELET
BASO%: 0.4 % (ref 0.0–2.0)
BASOS ABS: 0 10*3/uL (ref 0.0–0.1)
EOS ABS: 0.1 10*3/uL (ref 0.0–0.5)
EOS%: 2.4 % (ref 0.0–7.0)
HEMATOCRIT: 39.5 % (ref 34.8–46.6)
HGB: 13 g/dL (ref 11.6–15.9)
LYMPH#: 1 10*3/uL (ref 0.9–3.3)
LYMPH%: 18.5 % (ref 14.0–49.7)
MCH: 32 pg (ref 25.1–34.0)
MCHC: 32.9 g/dL (ref 31.5–36.0)
MCV: 97.3 fL (ref 79.5–101.0)
MONO#: 0.5 10*3/uL (ref 0.1–0.9)
MONO%: 8.9 % (ref 0.0–14.0)
NEUT#: 3.8 10*3/uL (ref 1.5–6.5)
NEUT%: 69.8 % (ref 38.4–76.8)
PLATELETS: 198 10*3/uL (ref 145–400)
RBC: 4.06 10*6/uL (ref 3.70–5.45)
RDW: 14.9 % — ABNORMAL HIGH (ref 11.2–14.5)
WBC: 5.4 10*3/uL (ref 3.9–10.3)

## 2014-12-14 LAB — COMPREHENSIVE METABOLIC PANEL (CC13)
ALBUMIN: 3.4 g/dL — AB (ref 3.5–5.0)
ALT: 20 U/L (ref 0–55)
AST: 29 U/L (ref 5–34)
Alkaline Phosphatase: 103 U/L (ref 40–150)
Anion Gap: 9 mEq/L (ref 3–11)
BUN: 26.6 mg/dL — AB (ref 7.0–26.0)
CALCIUM: 9.1 mg/dL (ref 8.4–10.4)
CHLORIDE: 104 meq/L (ref 98–109)
CO2: 30 mEq/L — ABNORMAL HIGH (ref 22–29)
CREATININE: 1.2 mg/dL — AB (ref 0.6–1.1)
EGFR: 43 mL/min/{1.73_m2} — ABNORMAL LOW (ref >90)
GLUCOSE: 130 mg/dL (ref 70–140)
Potassium: 4.2 mEq/L (ref 3.5–5.1)
Sodium: 142 mEq/L (ref 136–145)
Total Bilirubin: 0.77 mg/dL (ref 0.20–1.20)
Total Protein: 6.5 g/dL (ref 6.4–8.3)

## 2014-12-14 NOTE — Progress Notes (Signed)
OFFICE PROGRESS NOTE   December 14, 2014   Physicians:J.Varanasi, J.Allred, B.Hoxworth, R.Polite  INTERVAL HISTORY:   Patient is seen, alone for visit, in scheduled follow up of history of granulosa cell tumor of ovary, with interventions for recurrent disease most recently 2011. Last imaging was CTs chest and AP in fall 2013.  Patient has no complaints that seem concerning from standpoint of the granulosa cell tumor. She needed adjustment in cardiac medications recently due to bradycardia, with improvement; she has pacer in. Angina has been better recently. She is on coumadin for stroke prevention with Afib. Energy is generally good, appetite good. No abdominal or pelvic pain. No bleeding. No SOB with present activity, which includes Silver Sneakers 3x weekly. No new or different pain. No LE swelling. No fever or symptoms of infection. Remainder of 10 point Review of Systems negative.   Flu vaccine done 12-13-14.   ONCOLOGIC HISTORY The granulosa cell tumor was initially diagnosed at [facility] in 2000, with recurrences in 2004, 2007 and spring 2011. The original operative note from 2000 and that pathology did not transfer into present EMR and I do not know laterality of the original tumor. The surgery in 2004 included resection of the tumor with small bowel resection and colectomy for diverticulosis. She had laparoscopic repair of ventral hernia in 2005. In 2007 she had resection of pelvic recurrence of tumor plus open repair of ventral hernia with mesh. Most recent intervention was laparoscopic resection of suprapubic involvement by Dr.Hoxworth in May 2011; she had a small and asymptomatic suprapubic hernia in 2011 which has been followed. All of her disease has been very indolent and has generally been detected by CT scans. Last CT AP was April 2013; [name], self-limited episode of bowel obstruction in May  2012.     Objective:  Vital signs in last 24 hours:  BP 119/68 mmHg  Pulse 97  Temp(Src) 97.8 F (36.6 C) (Oral)  Resp 18  Ht 5' (1.524 m)  Wt 178 lb 12.8 oz (81.103 kg)  BMI 34.92 kg/m2  SpO2 98% Weight up 5 lbs from May. Alert, oriented and appropriate, looks comfortable, just delightful as always. Ambulatory without difficulty  HEENT:PERRL, sclerae not icteric. Oral mucosa moist without lesions, posterior pharynx clear.   No JVD.  Lymphatics:no cervical,supaclavicular or inguinal adenopathy Resp: clear to auscultation bilaterally and normal percussion bilaterally Cardio: irregular rate and rhythm with Afib. No gallop. GI: soft, nontender, not distended, no mass or organomegaly. Normally active bowel sounds. Surgical incision not remarkable. Musculoskeletal/ Extremities: without pitting edema, cords, tenderness Neuro: nonfocal Skin without rash, ecchymosis, petechiae   Lab Results:  Results for orders placed or performed in visit on 12/14/14  CBC with Differential  Result Value Ref Range   WBC 5.4 3.9 - 10.3 10e3/uL   NEUT# 3.8 1.5 - 6.5 10e3/uL   HGB 13.0 11.6 - 15.9 g/dL   HCT 39.5 34.8 - 46.6 %   Platelets 198 145 - 400 10e3/uL   MCV 97.3 79.5 - 101.0 fL   MCH 32.0 25.1 - 34.0 pg   MCHC 32.9 31.5 - 36.0 g/dL   RBC 4.06 3.70 - 5.45 10e6/uL   RDW 14.9 (H) 11.2 - 14.5 %   lymph# 1.0 0.9 - 3.3 10e3/uL   MONO# 0.5 0.1 - 0.9 10e3/uL   Eosinophils Absolute 0.1 0.0 - 0.5 10e3/uL   Basophils Absolute 0.0 0.0 - 0.1 10e3/uL  NEUT% 69.8 38.4 - 76.8 %   LYMPH% 18.5 14.0 - 49.7 %   MONO% 8.9 0.0 - 14.0 %   EOS% 2.4 0.0 - 7.0 %   BASO% 0.4 0.0 - 2.0 %  Comprehensive metabolic panel (Cmet) - [facility]  Result Value Ref Range   Sodium 142 136 - 145 mEq/L   Potassium 4.2 3.5 - 5.1 mEq/L   Chloride 104 98 - 109 mEq/L   CO2 30 (H) 22 - 29 mEq/L   Glucose 130 70 - 140 mg/dl   BUN 26.6 (H) 7.0 - 26.0 mg/dL   Creatinine 1.2 (H) 0.6 - 1.1 mg/dL   Total Bilirubin 0.77 0.20 -  1.20 mg/dL   Alkaline Phosphatase 103 40 - 150 U/L   AST 29 5 - 34 U/L   ALT 20 0 - 55 U/L   Total Protein 6.5 6.4 - 8.3 g/dL   Albumin 3.4 (L) 3.5 - 5.0 g/dL   Calcium 9.1 8.4 - 10.4 mg/dL   Anion Gap 9 3 - 11 mEq/L   EGFR 43 (L) >90 ml/min/1.73 m2     Studies/Results: [facility] EXAM: DIGITAL SCREENING BILATERAL MAMMOGRAM WITH CAD  COMPARISON: Previous exam(s).  ACR Breast Density Category b: There are scattered areas of fibroglandular density.  FINDINGS: There are no findings suspicious for malignancy. Images were processed with CAD.  IMPRESSION: No mammographic evidence of malignancy. A result letter of this screening mammogram will be mailed directly to the patient.  RECOMMENDATION: Screening mammogram in one year. (Code:[id number]-01Y)  BI-RADS CATEGORY 1: Negative.   Medications: I have reviewed the patient's current medications.  DISCUSSION: Clinically doing well from standpoint of granulosa cell tumor; follow with observation, scan if concerns. She is to see PCP, now Dr Delfina Redwood, in Jan. I will see her back in ~ 9 mo or sooner if needed.  Assessment/Plan:  1.granulosa cell tumor of ovary: several local recurrences since 2004 treated surgically, very indolent. She has never needed systemic treatment. I will see her late summer unless concerns prior 2.cardiac disease: CAD with stents, pacer, Afib/ tachy brady, LBBB, hx MI, on anticoagulation. Followed closely by Dr Irish Lack 3.flu vaccine done 4.sleep apnea on CPAP 5.HTN, elevated lipids, PVD 6.obesity   All questions answered. Time spent 15 min including >50% counseling and coordination of care. Cc Dr Delfina Redwood   Gordy Levan, MD   12/14/2014, 5:09 PM

## 2014-12-15 ENCOUNTER — Encounter (HOSPITAL_COMMUNITY): Payer: Medicare Other

## 2014-12-18 ENCOUNTER — Encounter (HOSPITAL_COMMUNITY): Payer: Medicare Other

## 2014-12-18 ENCOUNTER — Other Ambulatory Visit: Payer: Self-pay | Admitting: Internal Medicine

## 2014-12-18 MED ORDER — METOPROLOL TARTRATE 100 MG PO TABS: 100.0000 mg | ORAL_TABLET | Freq: Two times a day (BID) | ORAL | Status: DC

## 2014-12-20 ENCOUNTER — Encounter (HOSPITAL_COMMUNITY): Payer: Medicare Other

## 2014-12-22 ENCOUNTER — Encounter (HOSPITAL_COMMUNITY): Payer: Medicare Other

## 2014-12-25 ENCOUNTER — Encounter (HOSPITAL_COMMUNITY): Payer: Medicare Other

## 2014-12-27 ENCOUNTER — Encounter (HOSPITAL_COMMUNITY): Payer: Medicare Other

## 2015-01-01 ENCOUNTER — Encounter (HOSPITAL_COMMUNITY): Payer: Medicare Other

## 2015-01-01 ENCOUNTER — Encounter: Payer: Self-pay | Admitting: Internal Medicine

## 2015-01-02 ENCOUNTER — Other Ambulatory Visit: Payer: Self-pay | Admitting: Interventional Cardiology

## 2015-01-02 ENCOUNTER — Other Ambulatory Visit: Payer: Self-pay | Admitting: *Deleted

## 2015-01-02 ENCOUNTER — Other Ambulatory Visit (INDEPENDENT_AMBULATORY_CARE_PROVIDER_SITE_OTHER): Payer: Medicare Other

## 2015-01-02 ENCOUNTER — Ambulatory Visit (INDEPENDENT_AMBULATORY_CARE_PROVIDER_SITE_OTHER): Payer: Medicare Other | Admitting: Pharmacist Clinician (PhC)/ Clinical Pharmacy Specialist

## 2015-01-02 DIAGNOSIS — I4891 Unspecified atrial fibrillation: Secondary | ICD-10-CM

## 2015-01-02 DIAGNOSIS — E785 Hyperlipidemia, unspecified: Secondary | ICD-10-CM | POA: Diagnosis not present

## 2015-01-02 DIAGNOSIS — I10 Essential (primary) hypertension: Secondary | ICD-10-CM | POA: Diagnosis not present

## 2015-01-02 DIAGNOSIS — Z5181 Encounter for therapeutic drug level monitoring: Secondary | ICD-10-CM

## 2015-01-02 DIAGNOSIS — I4892 Unspecified atrial flutter: Secondary | ICD-10-CM | POA: Diagnosis not present

## 2015-01-02 LAB — LIPID PANEL
Cholesterol: 136 mg/dL (ref 125–200)
HDL: 62 mg/dL (ref >=46)
LDL Cholesterol: 61 mg/dL (ref <130)
Total CHOL/HDL Ratio: 2.2 Ratio (ref <=5.0)
Triglycerides: 67 mg/dL (ref <150)
VLDL: 13 mg/dL (ref <30)

## 2015-01-02 LAB — CBC WITH DIFFERENTIAL/PLATELET
BASOS ABS: 0 10*3/uL (ref 0.0–0.1)
BASOS PCT: 0 % (ref 0–1)
EOS ABS: 0.2 10*3/uL (ref 0.0–0.7)
Eosinophils Relative: 4 % (ref 0–5)
HCT: 38.5 % (ref 36.0–46.0)
Hemoglobin: 12.9 g/dL (ref 12.0–15.0)
LYMPHS ABS: 1.3 10*3/uL (ref 0.7–4.0)
Lymphocytes Relative: 23 % (ref 12–46)
MCH: 31.1 pg (ref 26.0–34.0)
MCHC: 33.5 g/dL (ref 30.0–36.0)
MCV: 92.8 fL (ref 78.0–100.0)
MPV: 10.2 fL (ref 8.6–12.4)
Monocytes Absolute: 0.7 10*3/uL (ref 0.1–1.0)
Monocytes Relative: 12 % (ref 3–12)
NEUTROS PCT: 61 % (ref 43–77)
Neutro Abs: 3.4 10*3/uL (ref 1.7–7.7)
PLATELETS: 212 10*3/uL (ref 150–400)
RBC: 4.15 MIL/uL (ref 3.87–5.11)
RDW: 14.4 % (ref 11.5–15.5)
WBC: 5.6 10*3/uL (ref 4.0–10.5)

## 2015-01-02 LAB — COMPREHENSIVE METABOLIC PANEL
ALT: 19 U/L (ref 6–29)
AST: 27 U/L (ref 10–35)
Albumin: 3.4 g/dL — ABNORMAL LOW (ref 3.6–5.1)
Alkaline Phosphatase: 84 U/L (ref 33–130)
BILIRUBIN TOTAL: 0.8 mg/dL (ref 0.2–1.2)
BUN: 30 mg/dL — AB (ref 7–25)
CO2: 30 mmol/L (ref 20–31)
CREATININE: 1.39 mg/dL — AB (ref 0.60–0.88)
Calcium: 8.8 mg/dL (ref 8.6–10.4)
Chloride: 104 mmol/L (ref 98–110)
Glucose, Bld: 91 mg/dL (ref 65–99)
Potassium: 4.4 mmol/L (ref 3.5–5.3)
SODIUM: 140 mmol/L (ref 135–146)
TOTAL PROTEIN: 6 g/dL — AB (ref 6.1–8.1)

## 2015-01-02 LAB — TSH: TSH: 5.467 u[IU]/mL — AB (ref 0.350–4.500)

## 2015-01-02 LAB — POCT INR: INR: 2.5

## 2015-01-03 ENCOUNTER — Encounter (HOSPITAL_COMMUNITY): Payer: Medicare Other

## 2015-01-04 ENCOUNTER — Other Ambulatory Visit: Payer: Self-pay | Admitting: *Deleted

## 2015-01-04 NOTE — Patient Outreach (Signed)
Called 2nd time for screening but she was not at home, her husband said she was out and about in the community. He said I might be able to reach her this evening or tomorrow!  Deloria Lair [facility] [phone]

## 2015-01-08 ENCOUNTER — Encounter: Payer: Self-pay | Admitting: *Deleted

## 2015-01-08 ENCOUNTER — Other Ambulatory Visit: Payer: Self-pay | Admitting: *Deleted

## 2015-01-08 NOTE — Patient Outreach (Signed)
HIGH RISK LIST PATIENT SCREENING:  Patty [name] lives with her husband and is good health at this time. She is independent in ADLS and Independent activities of daily living. She drives and is very busy in the community.  She has CAD and has had some surgeries. She has a pacemaker that is inside the heart. Hers was the second one in the [place] to be placed! She sees Dr. [name] and Dr. Irish Lack.  She also had a rare cancer which was surgically removed and she has been cancer free for 5 years.  She does not have any case management needs at this time but is very glad to know our services exist and is going to keep my number for future reference. I will send her a letter and our brochure.  Deloria Lair [facility] [place] [phone]

## 2015-01-24 ENCOUNTER — Other Ambulatory Visit: Payer: Self-pay | Admitting: Interventional Cardiology

## 2015-02-08 DIAGNOSIS — J069 Acute upper respiratory infection, unspecified: Secondary | ICD-10-CM | POA: Diagnosis not present

## 2015-02-13 ENCOUNTER — Other Ambulatory Visit: Payer: Self-pay | Admitting: Internal Medicine

## 2015-02-22 ENCOUNTER — Ambulatory Visit (INDEPENDENT_AMBULATORY_CARE_PROVIDER_SITE_OTHER): Payer: Medicare Other | Admitting: *Deleted

## 2015-02-22 DIAGNOSIS — Z5181 Encounter for therapeutic drug level monitoring: Secondary | ICD-10-CM

## 2015-02-22 DIAGNOSIS — I4891 Unspecified atrial fibrillation: Secondary | ICD-10-CM

## 2015-02-22 LAB — POCT INR: INR: 3.1

## 2015-03-12 ENCOUNTER — Other Ambulatory Visit: Payer: Self-pay | Admitting: *Deleted

## 2015-03-12 MED ORDER — POTASSIUM CHLORIDE CRYS ER 20 MEQ PO TBCR: 20.0000 meq | EXTENDED_RELEASE_TABLET | Freq: Every day | ORAL | Status: DC

## 2015-03-30 ENCOUNTER — Other Ambulatory Visit: Payer: Self-pay | Admitting: Interventional Cardiology

## 2015-04-02 NOTE — Progress Notes (Signed)
Electrophysiology Office Note Date: 04/03/2015  ID:  Patty [name], DOB 1930/09/17, MRN [id number]  PCP: Kandice Hams, MD Primary Cardiologist: Irish Lack Electrophysiologist: Allred  CC: Pacemaker follow-up  Patty [name] is a 82 y.o. female is seen today for Dr Patty [name].  Since last being seen in our clinic, the patient reports doing very well. She remains active exercising at the gym. She continues to have intermittent mechanical falls.  She has had one episode of chest pain since last being seen. She was very stressed about politics at that time. She took a NTG with relief. She has not had any chest pain since.  She denies palpitations, dyspnea, PND, orthopnea, nausea, vomiting, dizziness, syncope, edema.  Device History: STJ leadless PPM implanted 2014 for tachy/brady   Past Medical History  Diagnosis Date  . Hypertension   . Hyperlipidemia   . Obesity   . Diverticulitis   . Peripheral vascular disease ([place])   . Varicose veins   . Granulosa cell carcinoma ([place])     abd; last episode was in 2009  . Tachycardia-bradycardia syndrome ([place])     a. s/p leadless pacemaker ([name]) implanted by Dr Patty [name]  . Permanent atrial fibrillation ([place]) 2013  . OSA on CPAP   . Combined systolic and diastolic heart failure, NYHA class 3 ([place])   . Coronary artery disease     a. s/p multiple stents  b. LHC 12/14/13 with sig CAD with TO of OM1, 70-80% occusion OM2, 50-70% mLCx, 50-70% pLAD (recent FFR 0.83), diffusely diseased RCA, widely patent stetns in pLAD and LCx.  Marland Kitchen LBBB (left bundle branch block)        . Myocardial infarction ([place]) 2002  . Renal artery stenosis [facility])    Past Surgical History  Procedure Laterality Date  . Cholecystectomy  1980's  . Salivary gland surgery  2000's    "had a little lump removed; granulosa related; it was benign" (08/11/2012)  . Abdominal hysterectomy    . Colon surgery  2004    colectomy for diverticulosis  . Granulosa tumor excision  2000;  2003; 2004; 2007    "all in my abdomen including small intestines, outside my ?uterus/etc" (08/11/2012)  . Tee without cardioversion  12/31/2011    Procedure: TRANSESOPHAGEAL ECHOCARDIOGRAM (TEE);  Surgeon: [name], MD;  Location: [place];  Service: Cardiovascular;  Laterality: N/A;  . Cardioversion  12/31/2011    Procedure: CARDIOVERSION;  Surgeon: [name], MD;  Location: [place];  Service: Cardiovascular;  Laterality: N/A;  . Cardiac catheterization  09/03/2007    EF 70%; Failed attempt at PCI to OM  . Cardiac catheterization  11/01/2003    EF 70%  . Coronary angioplasty with stent placement  2000; 08/11/2012; 11/12/2012    3 + 2 LAD & CFX; 2nd CFX stent 11/12/2012  . Varicose vein surgery Bilateral 1977  . Hernia repair  2005    "laparoscopic"  . Cataract extraction, bilateral  2015  . Cardioversion N/A 12/31/2011    Procedure: CARDIOVERSION;  Surgeon: [name], MD;  Location: [facility] CATH LAB;  Service: Cardiovascular;  Laterality: N/A;  . Permanent pacemaker insertion N/A 03/16/2012    [name] (SJM) leadless pacemaker (LEADLESS II STUDY PATEINT)  . Percutaneous coronary intervention-balloon only  08/04/2012    Procedure: PERCUTANEOUS CORONARY INTERVENTION-BALLOON ONLY;  Surgeon: [name], MD;  Location: [facility] CATH LAB;  Service: Cardiovascular;;  . Percutaneous coronary rotoblator intervention (pci-r) N/A 08/11/2012    Procedure: PERCUTANEOUS CORONARY ROTOBLATOR INTERVENTION (  PCI-R);  Surgeon: [name], MD;  Location: [facility] CATH LAB;  Service: Cardiovascular;  Laterality: N/A;  . Left heart catheterization with coronary angiogram N/A 11/12/2012    Procedure: LEFT HEART CATHETERIZATION WITH CORONARY ANGIOGRAM;  Surgeon: [name], MD;  Location: [facility] CATH LAB;  Service: Cardiovascular;  Laterality: N/A;  . Left heart catheterization with coronary angiogram N/A 10/07/2013    Procedure: LEFT HEART CATHETERIZATION WITH CORONARY  ANGIOGRAM;  Surgeon: [name], MD;  Location: [facility] CATH LAB;  Service: Cardiovascular;  Laterality: N/A;  . Percutaneous coronary stent intervention (pci-s)  10/07/2013    Procedure: PERCUTANEOUS CORONARY STENT INTERVENTION (PCI-S);  Surgeon: [name], MD;  Location: [facility] CATH LAB;  Service: Cardiovascular;;  . Fractional flow reserve wire  10/07/2013    Procedure: FRACTIONAL FLOW RESERVE WIRE;  Surgeon: [name], MD;  Location: [facility] CATH LAB;  Service: Cardiovascular;;  . Left heart catheterization with coronary angiogram N/A 12/14/2013    Procedure: LEFT HEART CATHETERIZATION WITH CORONARY ANGIOGRAM;  Surgeon: Sinclair Grooms, MD;  Location: [facility] CATH LAB;  Service: Cardiovascular;  Laterality: N/A;  . Left heart catheterization with coronary angiogram N/A 05/16/2014    Procedure: LEFT HEART CATHETERIZATION WITH CORONARY ANGIOGRAM;  Surgeon: Sherren Mocha, MD;  Location: [facility] CATH LAB;  Service: Cardiovascular;  Laterality: N/A;    Current Outpatient Prescriptions  Medication Sig Dispense Refill  . atorvastatin (LIPITOR) 80 MG tablet TAKE 1 TABLET BY MOUTH EVERY MORNING 30 tablet 3  . beta carotene w/minerals (OCUVITE) tablet Take 2 tablets by mouth daily.    . Calcium Carb-Cholecalciferol (CALTRATE 600+D) 600-800 MG-UNIT TABS Take 1 tablet by mouth every morning.     . clopidogrel (PLAVIX) 75 MG tablet TAKE 1 TABLET BY MOUTH EVERY DAY WITH BREAKFAST 30 tablet 5  . Coenzyme Q-10 100 MG capsule Take 100 mg by mouth daily.     Marland Kitchen diltiazem (CARDIZEM CD) 180 MG 24 hr capsule Take 1 capsule (180 mg total) by mouth daily. 90 capsule 3  . furosemide (LASIX) 40 MG tablet Take 40 mg by mouth 2 (two) times daily.    . isosorbide mononitrate (IMDUR) 60 MG 24 hr tablet TAKE 1 TABLET BY MOUTH EVERY MORNING AND 1/2 TABLET EVERY EVENING 45 tablet 6  . lisinopril (PRINIVIL,ZESTRIL) 5 MG tablet TAKE 1 TABLET BY MOUTH 2 TIMES DAILY 60 tablet 3  . metoprolol (LOPRESSOR) 100 MG tablet Take 1  tablet (100 mg total) by mouth 2 (two) times daily. 60 tablet 11  . Multiple Vitamin (MULTIVITAMIN) capsule Take 1 capsule by mouth daily.    Marland Kitchen NITROSTAT 0.4 MG SL tablet PLACE 1 TABLET (0.4 MG TOTAL) UNDER THE TONGUE EVERY 5 (FIVE) MINUTES AS NEEDED. FOR CHEST PAIN 25 tablet 2  . Omega-3 Fatty Acids (FISH OIL PO) Take 1,200 mg by mouth daily.     . potassium chloride SA (K-DUR,KLOR-CON) 20 MEQ tablet Take 1 tablet (20 mEq total) by mouth daily. 30 tablet 6  . spironolactone (ALDACTONE) 25 MG tablet TAKE 1/2 TABLETS BY MOUTH EVERY DAY 30 tablet 6  . warfarin (COUMADIN) 2 MG tablet TAKE 0.5-1 TABLETS (1-2 MG TOTAL) BY MOUTH DAILY. AS DIRECTED 30 tablet 3   No current facility-administered medications for this visit.    Allergies:   Bee venom and Other   Social History: Social History   Social History  . Marital Status: Married    Spouse Name: N/A  . Number of Children: N/A  . Years of  Education: N/A   Occupational History  . Retired    Social History Main Topics  . Smoking status: Former Smoker -- 1.00 packs/day for 32 years    Types: Cigarettes    Quit date: 02/03/1974  . Smokeless tobacco: Never Used  . Alcohol Use: 3.0 oz/week    5 Glasses of wine per week     Comment: 11/12/2012 "4oz wine w/dinner 5 days/wk"  . Drug Use: No  . Sexual Activity: No   Other Topics Concern  . Not on file   Social History Narrative   Lives with family.    Family History: Family History  Problem Relation Age of Onset  . Heart attack Father   . Heart attack Brother   . Stroke Mother   . Diabetes Brother   . Diabetes Father   . Hypertension Brother   . Hypertension Father   . Kidney failure Brother      Review of Systems: All other systems reviewed and are otherwise negative except as noted above.   Physical Exam: VS:  BP 120/50 mmHg  Pulse 74  Ht 5' (1.524 m)  Wt 182 lb 6.4 oz (82.736 kg)  BMI 35.62 kg/m2  SpO2 94% , BMI Body mass index is 35.62 kg/(m^2).  GEN- The  patient is elderly appearing, alert and oriented x 3 today.   HEENT: normocephalic, atraumatic; sclera clear, conjunctiva pink; hearing intact; oropharynx clear; neck supple Lungs- Clear to ausculation bilaterally, normal work of breathing.  No wheezes, rales, rhonchi Heart- Regular rate and rhythm GI- soft, non-tender, non-distended, bowel sounds present Extremities- no clubbing, cyanosis, or edema; DP/PT/radial pulses 2+ bilaterally  MS- no significant deformity or atrophy Skin- warm and dry, scattered bruises Psych- euthymic mood, full affect Neuro- strength and sensation are intact  PPM Interrogation- reviewed in detail today,  See PACEART report  EKG:  EKG is not ordered today.  Recent Labs: 05/14/2014: B Natriuretic Peptide 386.6* 01/02/2015: ALT 19; BUN 30*; Creat 1.39*; Hemoglobin 12.9; Platelets 212; Potassium 4.4; Sodium 140; TSH 5.467*   Wt Readings from Last 3 Encounters:  04/03/15 182 lb 6.4 oz (82.736 kg)  12/14/14 178 lb 12.8 oz (81.103 kg)  10/11/14 177 lb 6.4 oz (80.468 kg)     Other studies Reviewed: Additional studies/ records that were reviewed today include: hospital records, Dr Jackalyn Lombard office notes  Assessment and Plan:  1.  Permanent atrial fibrillation Continue Warfarin for CHADS2VASC score of at least 5 Rates are reasonable, continue current therapy Normal PPM function  See Claudia Desanctis Art report Manufacturer advisory discussed with patient today. She is aware the recommendation from STJ at this time is to not replace the device. Follow up per research protocol  2.  CAD 1 episode of chest pain relieved with NTG None since despite exercising frequently Continue current therapy   3.  HTN Stable No change required today   Current medicines are reviewed at length with the patient today.   The patient does not have concerns regarding her medicines.  The following changes were made today: none  Labs/ tests ordered today include: none   Disposition:    Follow up with Dr Patty [name] 6 months, Dr Irish Lack as scheduled    Signed, Chanetta Marshall, NP 04/03/2015 3:53 PM  [place] [address] [place] [address] [phone] (office) [phone] (fax)

## 2015-04-03 ENCOUNTER — Ambulatory Visit (INDEPENDENT_AMBULATORY_CARE_PROVIDER_SITE_OTHER): Payer: Medicare Other | Admitting: Nurse Practitioner

## 2015-04-03 ENCOUNTER — Encounter: Payer: Self-pay | Admitting: Nurse Practitioner

## 2015-04-03 VITALS — BP 120/50 | HR 74 | Ht 60.0 in | Wt 182.4 lb

## 2015-04-03 DIAGNOSIS — I482 Chronic atrial fibrillation: Secondary | ICD-10-CM

## 2015-04-03 DIAGNOSIS — I10 Essential (primary) hypertension: Secondary | ICD-10-CM

## 2015-04-03 DIAGNOSIS — I4821 Permanent atrial fibrillation: Secondary | ICD-10-CM

## 2015-04-03 DIAGNOSIS — I495 Sick sinus syndrome: Secondary | ICD-10-CM

## 2015-04-03 LAB — CUP PACEART INCLINIC DEVICE CHECK
Date Time Interrogation Session: 20170228161459
MDC IDC PG SERIAL: 1096

## 2015-04-03 NOTE — Patient Instructions (Signed)
Medication Instructions:   Your physician recommends that you continue on your current medications as directed. Please refer to the Current Medication list given to you today.    If you need a refill on your cardiac medications before your next appointment, please call your pharmacy.  Labwork: NONE ORDER TODAY    Testing/Procedures:  NONE ORDER TODAY    Follow-Up:    Your physician wants you to follow-up in:  IN 6 MONTHS WITH DR [name].Marland Kitchen You will receive a reminder letter in the mail two months in advance. If you don't receive a letter, please call our office to schedule the follow-up appointment.  '  3 MONTHS WITH RESEARCH TEAM SOME ONE WILL CONTACT YOU  WITH AN APPOINTMENT     Any Other Special Instructions Will Be Listed Below (If Applicable).

## 2015-04-05 ENCOUNTER — Ambulatory Visit: Payer: Medicare Other | Admitting: Nurse Practitioner

## 2015-04-17 NOTE — Progress Notes (Signed)
Patient ID: Patty [name], female   DOB: October 05, 1930, 82 y.o.   MRN: [id number]     Cardiology Office Note   Date:  04/18/2015   ID:  Patty [name], DOB 02-Dec-1930, MRN [id number]  PCP:  Kandice Hams, MD    No chief complaint on file.    Wt Readings from Last 3 Encounters:  04/18/15 180 lb (81.647 kg)  04/03/15 182 lb 6.4 oz (82.736 kg)  12/14/14 178 lb 12.8 oz (81.103 kg)       History of Present Illness: Patty [name] is a 82 y.o. female  with AFib and CAD. She had multivessel rotational atherectomy and stent placement in 7/14. She finished rehab without any problems. In 9/15, She had a DES to an area of ISR from an old Bare metal stent from 2005 in the LAD. She has had more chest discomfort at times. Her medicines were adjusted for better rate control.  Overall she has been better. She had a repeat cath in 11/15 and her LAD was unchanged. She was going to start cardiac rehab but because she reported some chest discomfort, they wanted her evaluated first.  She ultimately she had repeat cath and stenting of the prox LAD. She has done well since that time. No palpitations.  SHe alternates exercise at the [facility].  She stopped cardiac rehab. She feels well exercising. SHe is getting 3 days of exercise per week. She knows she needs to do more.   Her oncologist has followed her for her abdominal cancer. It has been inactive for a while.  She has been having more frequent pacer checks of her leadless pacer.  Pacer has been doing 3% of the work which is decreased from prior.  Her chest tightness has decreased significantly.    She had a fall where she had severe bruising of the right leg.  This has improved but there is still a knot there.  Overall, she is dealing with stress better, although the current political scene is stressing her out.    Past Medical History  Diagnosis Date  . Hypertension   . Hyperlipidemia   . Obesity   . Diverticulitis   . Peripheral  vascular disease ([place])   . Varicose veins   . Granulosa cell carcinoma ([place])     abd; last episode was in 2009  . Tachycardia-bradycardia syndrome ([place])     a. s/p leadless pacemaker ([name]) implanted by Dr [name]  . Permanent atrial fibrillation ([place]) 2013  . OSA on CPAP   . Combined systolic and diastolic heart failure, NYHA class 3 ([place])   . Coronary artery disease     a. s/p multiple stents  b. LHC 12/14/13 with sig CAD with TO of OM1, 70-80% occusion OM2, 50-70% mLCx, 50-70% pLAD (recent FFR 0.83), diffusely diseased RCA, widely patent stetns in pLAD and LCx.  Marland Kitchen LBBB (left bundle branch block)        . Myocardial infarction ([facility]) 2002  . Renal artery stenosis [facility])     Past Surgical History  Procedure Laterality Date  . Cholecystectomy  1980's  . Salivary gland surgery  2000's    "had a little lump removed; granulosa related; it was benign" (08/11/2012)  . Abdominal hysterectomy    . Colon surgery  2004    colectomy for diverticulosis  . Granulosa tumor excision  2000; 2003; 2004; 2007    "all in my abdomen including small intestines, outside my ?uterus/etc" (08/11/2012)  . Tee without cardioversion  12/31/2011    Procedure: TRANSESOPHAGEAL ECHOCARDIOGRAM (TEE);  Surgeon: [name], MD;  Location: [place];  Service: Cardiovascular;  Laterality: N/A;  . Cardioversion  12/31/2011    Procedure: CARDIOVERSION;  Surgeon: [name], MD;  Location: [place];  Service: Cardiovascular;  Laterality: N/A;  . Cardiac catheterization  09/03/2007    EF 70%; Failed attempt at PCI to OM  . Cardiac catheterization  11/01/2003    EF 70%  . Coronary angioplasty with stent placement  2000; 08/11/2012; 11/12/2012    3 + 2 LAD & CFX; 2nd CFX stent 11/12/2012  . Varicose vein surgery Bilateral 1977  . Hernia repair  2005    "laparoscopic"  . Cataract extraction, bilateral  2015  . Cardioversion N/A 12/31/2011    Procedure: CARDIOVERSION;  Surgeon: [name], MD;  Location: [facility] CATH LAB;  Service: Cardiovascular;  Laterality: N/A;  . Permanent pacemaker insertion N/A 03/16/2012    [name] ([facility]) leadless pacemaker (LEADLESS II STUDY PATEINT)  . Percutaneous coronary intervention-balloon only  08/04/2012    Procedure: PERCUTANEOUS CORONARY INTERVENTION-BALLOON ONLY;  Surgeon: [name], MD;  Location: [facility] CATH LAB;  Service: Cardiovascular;;  . Percutaneous coronary rotoblator intervention (pci-r) N/A 08/11/2012    Procedure: PERCUTANEOUS CORONARY ROTOBLATOR INTERVENTION (PCI-R);  Surgeon: [name], MD;  Location: [facility] CATH LAB;  Service: Cardiovascular;  Laterality: N/A;  . Left heart catheterization with coronary angiogram N/A 11/12/2012    Procedure: LEFT HEART CATHETERIZATION WITH CORONARY ANGIOGRAM;  Surgeon: [name], MD;  Location: [name] CATH LAB;  Service: Cardiovascular;  Laterality: N/A;  . Left heart catheterization with coronary angiogram N/A 10/07/2013    Procedure: LEFT HEART CATHETERIZATION WITH CORONARY ANGIOGRAM;  Surgeon: [name], MD;  Location: [facility] CATH LAB;  Service: Cardiovascular;  Laterality: N/A;  . Percutaneous coronary stent intervention (pci-s)  10/07/2013    Procedure: PERCUTANEOUS CORONARY STENT INTERVENTION (PCI-S);  Surgeon: [name], MD;  Location: [facility] CATH LAB;  Service: Cardiovascular;;  . Fractional flow reserve wire  10/07/2013    Procedure: FRACTIONAL FLOW RESERVE WIRE;  Surgeon: [name], MD;  Location: [facility] CATH LAB;  Service: Cardiovascular;;  . Left heart catheterization with coronary angiogram N/A 12/14/2013    Procedure: LEFT HEART CATHETERIZATION WITH CORONARY ANGIOGRAM;  Surgeon: [place] Grooms, MD;  Location: [facility] CATH LAB;  Service: Cardiovascular;  Laterality: N/A;  . Left heart catheterization with coronary angiogram N/A 05/16/2014    Procedure: LEFT HEART CATHETERIZATION WITH CORONARY ANGIOGRAM;  Surgeon: Sherren Mocha, MD;  Location: [facility] CATH LAB;   Service: Cardiovascular;  Laterality: N/A;     Current Outpatient Prescriptions  Medication Sig Dispense Refill  . atorvastatin (LIPITOR) 80 MG tablet TAKE 1 TABLET BY MOUTH EVERY MORNING 30 tablet 3  . beta carotene w/minerals (OCUVITE) tablet Take 2 tablets by mouth daily.    . Calcium Carb-Cholecalciferol (CALTRATE 600+D) 600-800 MG-UNIT TABS Take 1 tablet by mouth every morning.     . clopidogrel (PLAVIX) 75 MG tablet TAKE 1 TABLET BY MOUTH EVERY DAY WITH BREAKFAST 30 tablet 5  . Coenzyme Q-10 100 MG capsule Take 100 mg by mouth daily.     Marland Kitchen diltiazem (CARDIZEM CD) 180 MG 24 hr capsule Take 1 capsule (180 mg total) by mouth daily. 90 capsule 3  . furosemide (LASIX) 40 MG tablet Take 1 tablet (40 mg total) by mouth 2 (two) times daily. 180 tablet 3  . isosorbide mononitrate (IMDUR) 60 MG 24 hr tablet  TAKE 1 TABLET BY MOUTH EVERY MORNING AND 1/2 TABLET EVERY EVENING 45 tablet 6  . lisinopril (PRINIVIL,ZESTRIL) 5 MG tablet TAKE 1 TABLET BY MOUTH 2 TIMES DAILY 60 tablet 3  . metoprolol (LOPRESSOR) 100 MG tablet Take 1 tablet (100 mg total) by mouth 2 (two) times daily. 60 tablet 11  . Multiple Vitamin (MULTIVITAMIN) capsule Take 1 capsule by mouth daily.    Marland Kitchen NITROSTAT 0.4 MG SL tablet PLACE 1 TABLET (0.4 MG TOTAL) UNDER THE TONGUE EVERY 5 (FIVE) MINUTES AS NEEDED. FOR CHEST PAIN 25 tablet 2  . Omega-3 Fatty Acids (FISH OIL PO) Take 1,200 mg by mouth daily.     . potassium chloride SA (K-DUR,KLOR-CON) 20 MEQ tablet Take 1 tablet (20 mEq total) by mouth daily. 30 tablet 6  . spironolactone (ALDACTONE) 25 MG tablet TAKE 1/2 TABLETS BY MOUTH EVERY DAY 30 tablet 6  . warfarin (COUMADIN) 2 MG tablet TAKE 0.5-1 TABLETS (1-2 MG TOTAL) BY MOUTH DAILY. AS DIRECTED 30 tablet 3   No current facility-administered medications for this visit.    Allergies:   Bee venom and Other    Social History:  The patient  reports that she quit smoking about 41 years ago. Her smoking use included Cigarettes. She  has a 32 pack-year smoking history. She has never used smokeless tobacco. She reports that she drinks about 3.0 oz of alcohol per week. She reports that she does not use illicit drugs.   Family History:  The patient's family history includes Diabetes in her brother and father; Heart attack in her brother and father; Hypertension in her brother and father; Kidney failure in her brother; Stroke in her mother.    ROS:  Please see the history of present illness.   Otherwise, review of systems are positive for knot on right leg.   All other systems are reviewed and negative.    PHYSICAL EXAM: VS:  BP 90/50 mmHg  Pulse 52  Ht 5' (1.524 m)  Wt 180 lb (81.647 kg)  BMI 35.15 kg/m2 , BMI Body mass index is 35.15 kg/(m^2). GEN: Well nourished, well developed, in no acute distress HEENT: normal Neck: no JVD, carotid bruits, or masses Cardiac: irregularly irregular; no murmurs, rubs, or gallops,tr edema  Respiratory:  clear to auscultation bilaterally, normal work of breathing GI: soft, nontender, nondistended, + BS MS: no deformity or atrophy Skin: warm and dry, no rash Neuro:  Strength and sensation are intact Psych: euthymic mood, full affect    Recent Labs: 05/14/2014: B Natriuretic Peptide 386.6* 01/02/2015: ALT 19; BUN 30*; Creat 1.39*; Hemoglobin 12.9; Platelets 212; Potassium 4.4; Sodium 140; TSH 5.467*   Lipid Panel    Component Value Date/Time   CHOL 136 01/02/2015 0847   TRIG 67 01/02/2015 0847   HDL 62 01/02/2015 0847   CHOLHDL 2.2 01/02/2015 0847   VLDL 13 01/02/2015 0847   LDLCALC 61 01/02/2015 0847     Other studies Reviewed: Additional studies/ records that were reviewed today with results demonstrating: prior cath records reviewed.   ASSESSMENT AND PLAN:  1. CAD: Minimal angina.  Managed with medications.  Sx resolve relaxation.  COntinue aggressive secondary prevention.  Lipids controlled. 2. AFib:  Rate controlled. Coumadin for stroke prevention. Continue pacer  checks. 3. LE swelling:  She did increase spironolactone to 25 mg due to some swelling.  She will go back to 12.5 daily.  If she has more fluid retention, she can take an extra half tab on occasion.  If she  is doing this more than 3x/week, she will let us know and we can check electrolytes.   Current medicines are reviewed at length with the patient today.  The patient concerns regarding her medicines were addressed.  The following changes have been made:  No change  Labs/ tests ordered today include:  No orders of the defined types were placed in this encounter.    Recommend 150 minutes/week of aerobic exercise Low fat, low carb, high fiber diet recommended  Disposition:   FU in 1 year   Teresita Madura., MD  04/18/2015 10:58 AM    [facility] [place], [place], [place]  [address] Phone: [phone]; Fax: [phone]

## 2015-04-18 ENCOUNTER — Ambulatory Visit (INDEPENDENT_AMBULATORY_CARE_PROVIDER_SITE_OTHER): Payer: Medicare Other | Admitting: Interventional Cardiology

## 2015-04-18 ENCOUNTER — Encounter: Payer: Self-pay | Admitting: Interventional Cardiology

## 2015-04-18 ENCOUNTER — Other Ambulatory Visit: Payer: Self-pay

## 2015-04-18 ENCOUNTER — Other Ambulatory Visit: Payer: Self-pay | Admitting: Interventional Cardiology

## 2015-04-18 VITALS — BP 90/50 | HR 52 | Ht 60.0 in | Wt 180.0 lb

## 2015-04-18 DIAGNOSIS — I25118 Atherosclerotic heart disease of native coronary artery with other forms of angina pectoris: Secondary | ICD-10-CM | POA: Diagnosis not present

## 2015-04-18 DIAGNOSIS — M7989 Other specified soft tissue disorders: Secondary | ICD-10-CM | POA: Insufficient documentation

## 2015-04-18 DIAGNOSIS — I4891 Unspecified atrial fibrillation: Secondary | ICD-10-CM | POA: Diagnosis not present

## 2015-04-18 DIAGNOSIS — E785 Hyperlipidemia, unspecified: Secondary | ICD-10-CM

## 2015-04-18 DIAGNOSIS — Z950 Presence of cardiac pacemaker: Secondary | ICD-10-CM

## 2015-04-18 MED ORDER — FUROSEMIDE 40 MG PO TABS: 40.0000 mg | ORAL_TABLET | Freq: Two times a day (BID) | ORAL | Status: DC

## 2015-04-18 MED ORDER — SPIRONOLACTONE 25 MG PO TABS: ORAL_TABLET | ORAL | Status: DC

## 2015-04-18 NOTE — Patient Instructions (Addendum)
**Note De-Identified Patty [name] Obfuscation** Medication Instructions:  You may take an additional 1/2 tablet of your Spironolactone as needed daily for swelling. If you have to take additional tablet 2 to 3 times a week please contact us. All of you other medications remain the same.  Labwork: None  Testing/Procedures: None  Follow-Up: Your physician wants you to follow-up in: 1 year. You will receive a reminder letter in the mail two months in advance. If you don't receive a letter, please call our office to schedule the follow-up appointment.     If you need a refill on your cardiac medications before your next appointment, please call your pharmacy.

## 2015-05-05 ENCOUNTER — Other Ambulatory Visit: Payer: Self-pay | Admitting: Interventional Cardiology

## 2015-05-08 ENCOUNTER — Telehealth: Payer: Self-pay | Admitting: Cardiology

## 2015-05-08 NOTE — Telephone Encounter (Signed)
Error did not need to start notes

## 2015-05-16 ENCOUNTER — Telehealth: Payer: Self-pay | Admitting: Interventional Cardiology

## 2015-05-16 NOTE — Telephone Encounter (Signed)
Pt calling to see when coumadin is due -look like she missed and  or cancelled her 03-29-15 appt, last visit shows 02-22-15, pt insists she has been seen since then, and wants to know when she is due before making another appt -pls call cell [phone]

## 2015-05-17 ENCOUNTER — Emergency Department (HOSPITAL_COMMUNITY): Payer: Medicare Other

## 2015-05-17 ENCOUNTER — Inpatient Hospital Stay (HOSPITAL_COMMUNITY)
Admission: EM | Admit: 2015-05-17 | Discharge: 2015-05-19 | DRG: 281 | Disposition: A | Discharge status: Home / Self Care | Payer: Medicare Other | Attending: Cardiology | Admitting: Cardiology

## 2015-05-17 ENCOUNTER — Encounter (HOSPITAL_COMMUNITY): Payer: Self-pay | Admitting: Emergency Medicine

## 2015-05-17 DIAGNOSIS — I25110 Atherosclerotic heart disease of native coronary artery with unstable angina pectoris: Secondary | ICD-10-CM | POA: Diagnosis not present

## 2015-05-17 DIAGNOSIS — Z9049 Acquired absence of other specified parts of digestive tract: Secondary | ICD-10-CM

## 2015-05-17 DIAGNOSIS — Z833 Family history of diabetes mellitus: Secondary | ICD-10-CM

## 2015-05-17 DIAGNOSIS — Z7901 Long term (current) use of anticoagulants: Secondary | ICD-10-CM

## 2015-05-17 DIAGNOSIS — I214 Non-ST elevation (NSTEMI) myocardial infarction: Secondary | ICD-10-CM | POA: Diagnosis not present

## 2015-05-17 DIAGNOSIS — I480 Paroxysmal atrial fibrillation: Secondary | ICD-10-CM | POA: Diagnosis present

## 2015-05-17 DIAGNOSIS — I252 Old myocardial infarction: Secondary | ICD-10-CM

## 2015-05-17 DIAGNOSIS — E785 Hyperlipidemia, unspecified: Secondary | ICD-10-CM | POA: Diagnosis present

## 2015-05-17 DIAGNOSIS — Z90710 Acquired absence of both cervix and uterus: Secondary | ICD-10-CM

## 2015-05-17 DIAGNOSIS — I5042 Chronic combined systolic (congestive) and diastolic (congestive) heart failure: Secondary | ICD-10-CM | POA: Diagnosis not present

## 2015-05-17 DIAGNOSIS — I482 Chronic atrial fibrillation: Secondary | ICD-10-CM | POA: Diagnosis present

## 2015-05-17 DIAGNOSIS — R079 Chest pain, unspecified: Secondary | ICD-10-CM | POA: Diagnosis not present

## 2015-05-17 DIAGNOSIS — Z841 Family history of disorders of kidney and ureter: Secondary | ICD-10-CM

## 2015-05-17 DIAGNOSIS — Z87891 Personal history of nicotine dependence: Secondary | ICD-10-CM

## 2015-05-17 DIAGNOSIS — I200 Unstable angina: Secondary | ICD-10-CM | POA: Diagnosis present

## 2015-05-17 DIAGNOSIS — I739 Peripheral vascular disease, unspecified: Secondary | ICD-10-CM | POA: Diagnosis present

## 2015-05-17 DIAGNOSIS — Z7982 Long term (current) use of aspirin: Secondary | ICD-10-CM

## 2015-05-17 DIAGNOSIS — I4892 Unspecified atrial flutter: Secondary | ICD-10-CM | POA: Diagnosis not present

## 2015-05-17 DIAGNOSIS — Z888 Allergy status to other drugs, medicaments and biological substances status: Secondary | ICD-10-CM

## 2015-05-17 DIAGNOSIS — I110 Hypertensive heart disease with heart failure: Secondary | ICD-10-CM | POA: Diagnosis not present

## 2015-05-17 DIAGNOSIS — Z955 Presence of coronary angioplasty implant and graft: Secondary | ICD-10-CM

## 2015-05-17 DIAGNOSIS — I4891 Unspecified atrial fibrillation: Secondary | ICD-10-CM

## 2015-05-17 DIAGNOSIS — G4733 Obstructive sleep apnea (adult) (pediatric): Secondary | ICD-10-CM | POA: Diagnosis present

## 2015-05-17 DIAGNOSIS — Z79899 Other long term (current) drug therapy: Secondary | ICD-10-CM

## 2015-05-17 DIAGNOSIS — Z823 Family history of stroke: Secondary | ICD-10-CM

## 2015-05-17 DIAGNOSIS — Z8249 Family history of ischemic heart disease and other diseases of the circulatory system: Secondary | ICD-10-CM

## 2015-05-17 DIAGNOSIS — I10 Essential (primary) hypertension: Secondary | ICD-10-CM | POA: Diagnosis not present

## 2015-05-17 DIAGNOSIS — I447 Left bundle-branch block, unspecified: Secondary | ICD-10-CM | POA: Diagnosis present

## 2015-05-17 DIAGNOSIS — Z91030 Bee allergy status: Secondary | ICD-10-CM

## 2015-05-17 HISTORY — DX: Long term (current) use of anticoagulants: Z79.01

## 2015-05-17 LAB — CBC WITH DIFFERENTIAL/PLATELET
Basophils Absolute: 0 10*3/uL (ref 0.0–0.1)
Basophils Relative: 0 %
EOS PCT: 2 %
Eosinophils Absolute: 0.1 10*3/uL (ref 0.0–0.7)
HCT: 39.9 % (ref 36.0–46.0)
HEMOGLOBIN: 13.4 g/dL (ref 12.0–15.0)
LYMPHS ABS: 1.2 10*3/uL (ref 0.7–4.0)
LYMPHS PCT: 17 %
MCH: 31.5 pg (ref 26.0–34.0)
MCHC: 33.6 g/dL (ref 30.0–36.0)
MCV: 93.7 fL (ref 78.0–100.0)
Monocytes Absolute: 0.6 10*3/uL (ref 0.1–1.0)
Monocytes Relative: 9 %
NEUTROS ABS: 4.8 10*3/uL (ref 1.7–7.7)
NEUTROS PCT: 72 %
PLATELETS: 231 10*3/uL (ref 150–400)
RBC: 4.26 MIL/uL (ref 3.87–5.11)
RDW: 14.2 % (ref 11.5–15.5)
WBC: 6.6 10*3/uL (ref 4.0–10.5)

## 2015-05-17 LAB — URINALYSIS, ROUTINE W REFLEX MICROSCOPIC
Bilirubin Urine: NEGATIVE
GLUCOSE, UA: NEGATIVE mg/dL
HGB URINE DIPSTICK: NEGATIVE
Ketones, ur: NEGATIVE mg/dL
Nitrite: NEGATIVE
PROTEIN: NEGATIVE mg/dL
Specific Gravity, Urine: 1.008 (ref 1.005–1.030)
pH: 6.5 (ref 5.0–8.0)

## 2015-05-17 LAB — TROPONIN I
TROPONIN I: 0.03 ng/mL (ref <0.031)
Troponin I: 0.26 ng/mL — ABNORMAL HIGH (ref <0.031)

## 2015-05-17 LAB — URINE MICROSCOPIC-ADD ON: RBC / HPF: NONE SEEN RBC/hpf (ref 0–5)

## 2015-05-17 LAB — BASIC METABOLIC PANEL
ANION GAP: 11 (ref 5–15)
BUN: 32 mg/dL — ABNORMAL HIGH (ref 6–20)
CHLORIDE: 101 mmol/L (ref 101–111)
CO2: 27 mmol/L (ref 22–32)
Calcium: 8.8 mg/dL — ABNORMAL LOW (ref 8.9–10.3)
Creatinine, Ser: 1.36 mg/dL — ABNORMAL HIGH (ref 0.44–1.00)
GFR calc Af Amer: 41 mL/min — ABNORMAL LOW (ref >60)
GFR, EST NON AFRICAN AMERICAN: 35 mL/min — AB (ref >60)
GLUCOSE: 115 mg/dL — AB (ref 65–99)
POTASSIUM: 4.7 mmol/L (ref 3.5–5.1)
Sodium: 139 mmol/L (ref 135–145)

## 2015-05-17 LAB — BRAIN NATRIURETIC PEPTIDE: B NATRIURETIC PEPTIDE 5: 129.9 pg/mL — AB (ref 0.0–100.0)

## 2015-05-17 LAB — TSH: TSH: 2.942 u[IU]/mL (ref 0.350–4.500)

## 2015-05-17 LAB — PROTIME-INR
INR: 2.51 — ABNORMAL HIGH (ref 0.00–1.49)
PROTHROMBIN TIME: 26.7 s — AB (ref 11.6–15.2)

## 2015-05-17 MED ORDER — ZOLPIDEM TARTRATE 5 MG PO TABS: 5.0000 mg | ORAL_TABLET | Freq: Every evening | ORAL | Status: DC | PRN

## 2015-05-17 MED ORDER — ONDANSETRON HCL 4 MG/2ML IJ SOLN: 4.0000 mg | Freq: Four times a day (QID) | INTRAMUSCULAR | Status: DC | PRN

## 2015-05-17 MED ORDER — METOPROLOL TARTRATE 100 MG PO TABS
100.0000 mg | ORAL_TABLET | Freq: Two times a day (BID) | ORAL | Status: DC
  Administered 2015-05-17 – 2015-05-18 (×3): 100 mg via ORAL
  Filled 2015-05-17: qty 4
  Filled 2015-05-17 (×2): qty 1

## 2015-05-17 MED ORDER — ATORVASTATIN CALCIUM 80 MG PO TABS
80.0000 mg | ORAL_TABLET | Freq: Every morning | ORAL | Status: DC
  Administered 2015-05-18 – 2015-05-19 (×2): 80 mg via ORAL
  Filled 2015-05-17 (×2): qty 1

## 2015-05-17 MED ORDER — LISINOPRIL 2.5 MG PO TABS
2.5000 mg | ORAL_TABLET | Freq: Two times a day (BID) | ORAL | Status: DC
  Administered 2015-05-17 – 2015-05-18 (×2): 2.5 mg via ORAL
  Filled 2015-05-17 (×4): qty 1

## 2015-05-17 MED ORDER — POTASSIUM CHLORIDE CRYS ER 20 MEQ PO TBCR
20.0000 meq | EXTENDED_RELEASE_TABLET | Freq: Every day | ORAL | Status: DC
  Administered 2015-05-17 – 2015-05-19 (×3): 20 meq via ORAL
  Filled 2015-05-17 (×3): qty 1

## 2015-05-17 MED ORDER — ADULT MULTIVITAMIN W/MINERALS CH
1.0000 | ORAL_TABLET | Freq: Every day | ORAL | Status: DC
  Administered 2015-05-18 – 2015-05-19 (×2): 1 via ORAL
  Filled 2015-05-17 (×3): qty 1

## 2015-05-17 MED ORDER — SODIUM CHLORIDE 0.9% FLUSH
3.0000 mL | Freq: Two times a day (BID) | INTRAVENOUS | Status: DC
  Administered 2015-05-17 – 2015-05-18 (×2): 3 mL via INTRAVENOUS

## 2015-05-17 MED ORDER — SODIUM CHLORIDE 0.9% FLUSH: 3.0000 mL | INTRAVENOUS | Status: DC | PRN

## 2015-05-17 MED ORDER — DEXTROSE 5 % IV SOLN
5.0000 mg/h | INTRAVENOUS | Status: DC
  Administered 2015-05-17: 12.5 mg/h via INTRAVENOUS
  Administered 2015-05-17: 5 mg/h via INTRAVENOUS
  Filled 2015-05-17 (×2): qty 100

## 2015-05-17 MED ORDER — SODIUM CHLORIDE 0.9 % IV SOLN: 250.0000 mL | INTRAVENOUS | Status: DC | PRN

## 2015-05-17 MED ORDER — CALCIUM CARBONATE-VITAMIN D 500-200 MG-UNIT PO TABS
1.0000 | ORAL_TABLET | Freq: Every morning | ORAL | Status: DC
  Administered 2015-05-18 – 2015-05-19 (×2): 1 via ORAL
  Filled 2015-05-17 (×3): qty 1

## 2015-05-17 MED ORDER — CLOPIDOGREL BISULFATE 75 MG PO TABS
75.0000 mg | ORAL_TABLET | Freq: Every day | ORAL | Status: DC
  Administered 2015-05-18 – 2015-05-19 (×2): 75 mg via ORAL
  Filled 2015-05-17 (×2): qty 1

## 2015-05-17 MED ORDER — ISOSORBIDE MONONITRATE ER 60 MG PO TB24
120.0000 mg | ORAL_TABLET | Freq: Every day | ORAL | Status: DC
  Administered 2015-05-18 – 2015-05-19 (×2): 120 mg via ORAL
  Filled 2015-05-17 (×2): qty 2

## 2015-05-17 MED ORDER — OCUVITE PO TABS
2.0000 | ORAL_TABLET | Freq: Every day | ORAL | Status: DC
  Filled 2015-05-17: qty 2

## 2015-05-17 MED ORDER — DILTIAZEM LOAD VIA INFUSION
10.0000 mg | Freq: Once | INTRAVENOUS | Status: AC
  Administered 2015-05-17: 10 mg via INTRAVENOUS
  Filled 2015-05-17: qty 10

## 2015-05-17 MED ORDER — NITROGLYCERIN 0.4 MG SL SUBL: 0.4000 mg | SUBLINGUAL_TABLET | SUBLINGUAL | Status: DC | PRN

## 2015-05-17 MED ORDER — FUROSEMIDE 40 MG PO TABS
40.0000 mg | ORAL_TABLET | Freq: Two times a day (BID) | ORAL | Status: DC
  Administered 2015-05-17 – 2015-05-18 (×2): 40 mg via ORAL
  Filled 2015-05-17: qty 1
  Filled 2015-05-17: qty 2
  Filled 2015-05-17: qty 1

## 2015-05-17 MED ORDER — ISOSORBIDE MONONITRATE ER 60 MG PO TB24
60.0000 mg | ORAL_TABLET | Freq: Every day | ORAL | Status: DC
Start: 2015-05-17 — End: 2015-05-17

## 2015-05-17 MED ORDER — SPIRONOLACTONE 25 MG PO TABS
25.0000 mg | ORAL_TABLET | Freq: Every day | ORAL | Status: DC
  Administered 2015-05-17 – 2015-05-19 (×3): 25 mg via ORAL
  Filled 2015-05-17 (×3): qty 1

## 2015-05-17 MED ORDER — ALPRAZOLAM 0.25 MG PO TABS: 0.2500 mg | ORAL_TABLET | Freq: Two times a day (BID) | ORAL | Status: DC | PRN

## 2015-05-17 MED ORDER — ACETAMINOPHEN 325 MG PO TABS: 650.0000 mg | ORAL_TABLET | ORAL | Status: DC | PRN

## 2015-05-17 NOTE — ED Notes (Signed)
MD at bedside. 

## 2015-05-17 NOTE — Consult Note (Signed)
CARDIOLOGY HISTORY AND PHYSICAL   Patient ID: Patty [name] MRN: [id number] DOB/AGE: 1930-04-01 82 y.o.  Admit date: 05/17/2015  Primary Physician   Kandice Hams, MD Primary Cardiologist   Dr Irish Lack Reason for Consultation   Chest pain, tachycardia  [id number] Patty [name] is a 82 y.o. year old female with a history of afib on coumadin, St Jude leadless PPM for junctional rhythm, CAD stents x 2 LAD & x 1 CFX 2000, rotational atherectomy to pCFX/pLAD & DES LAD 08/2012 chronic OM1 100%, DES CFX 11/2012, DES LAD for ISR 10/2013, DES oLAD 05/2014, D-CHF, HTN.  Seen in office 04/17/2015, no sig angina, afib rate controlled, coumadin therapeutic. Wt 180 lbs  Patty [name] gets angina 2 or 3 times a month. Each episode is relieved by one sublingual nitroglycerin. She had one of these approximately 4 days ago and it resolved as usual with 1 tablet. Yesterday a.m., she had another episode and again took one tablet with relief.  Today, at approximately 7:30 AM, she was wakened from sleep by chest pain, her usual angina. She took nitroglycerin with improvement but had to keep taking them because the symptoms did not resolve. She was a little short of breath with this. She took her usual dose of Lasix and her clopidogrel, and got nauseated so did not take any additional medications. She did not vomit. She did not have any diaphoresis. When the third dose of nitroglycerin did not relieve her pain, she felt she needed to be seen and came to the emergency room.   In the emergency room, she was in rapid atrial flutter with a left bundle branch block. She was started on IV Cardizem which has been increased to 10 mg per hour. On the IV Cardizem, her heart rate control has improved, but she still goes up to greater than 130 bpm if she sits up or talks with animation.   She is not short of breath and has not had orthopnea or PND. She has not had significant lower extremity edema. She is compliant with her  medications.    Past Medical History  Diagnosis Date  . Hypertension   . Hyperlipidemia   . Obesity   . Diverticulitis   . Peripheral vascular disease ([place])   . Varicose veins   . Granulosa cell carcinoma ([place])     abd; last episode was in 2009  . Tachycardia-bradycardia syndrome ([place])     a. s/p leadless pacemaker ([name]) implanted by Dr [name]  . Permanent atrial fibrillation ([place]) 2013  . OSA on CPAP   . Combined systolic and diastolic heart failure, NYHA class 3 ([place])   . Coronary artery disease     a. s/p multiple stents  b. LHC 12/14/13 with sig CAD with TO of OM1, 70-80% occusion OM2, 50-70% mLCx, 50-70% pLAD (recent FFR 0.83), diffusely diseased RCA, widely patent stetns in pLAD and LCx.  Marland Kitchen LBBB (left bundle branch block)        . Myocardial infarction ([place]) 2002  . Renal artery stenosis ([place])   . Pacemaker - St Jude Leadless PPM   . Chronic anticoagulation - coumadin, CHADS2VASC=6 05/17/2015     Past Surgical History  Procedure Laterality Date  . Cholecystectomy  1980's  . Salivary gland surgery  2000's    "had a little lump removed; granulosa related; it was benign" (08/11/2012)  . Abdominal hysterectomy    . Colon surgery  2004    colectomy for diverticulosis  .  Granulosa tumor excision  2000; 2003; 2004; 2007    "all in my abdomen including small intestines, outside my ?uterus/etc" (08/11/2012)  . Tee without cardioversion  12/31/2011    Procedure: TRANSESOPHAGEAL ECHOCARDIOGRAM (TEE);  Surgeon: [name], MD;  Location: [place];  Service: Cardiovascular;  Laterality: N/A;  . Cardioversion  12/31/2011    Procedure: CARDIOVERSION;  Surgeon: [name], MD;  Location: [place];  Service: Cardiovascular;  Laterality: N/A;  . Cardiac catheterization  09/03/2007    EF 70%; Failed attempt at PCI to OM  . Cardiac catheterization  11/01/2003    EF 70%  . Coronary angioplasty with stent placement  2000; 08/11/2012; 11/12/2012    3 + 2 LAD &  CFX; 2nd CFX stent 11/12/2012  . Varicose vein surgery Bilateral 1977  . Hernia repair  2005    "laparoscopic"  . Cataract extraction, bilateral  2015  . Cardioversion N/A 12/31/2011    Procedure: CARDIOVERSION;  Surgeon: [name], MD;  Location: [facility] CATH LAB;  Service: Cardiovascular;  Laterality: N/A;  . Permanent pacemaker insertion N/A 03/16/2012    [name] (SJM) leadless pacemaker (LEADLESS II STUDY PATEINT)  . Percutaneous coronary intervention-balloon only  08/04/2012    Procedure: PERCUTANEOUS CORONARY INTERVENTION-BALLOON ONLY;  Surgeon: [name], MD;  Location: [facility] CATH LAB;  Service: Cardiovascular;;  . Percutaneous coronary rotoblator intervention (pci-r) N/A 08/11/2012    Procedure: PERCUTANEOUS CORONARY ROTOBLATOR INTERVENTION (PCI-R);  Surgeon: [name], MD;  Location: [facility] CATH LAB;  Service: Cardiovascular;  Laterality: N/A;  . Left heart catheterization with coronary angiogram N/A 11/12/2012    Procedure: LEFT HEART CATHETERIZATION WITH CORONARY ANGIOGRAM;  Surgeon: [name], MD;  Location: [facility] CATH LAB;  Service: Cardiovascular;  Laterality: N/A;  . Left heart catheterization with coronary angiogram N/A 10/07/2013    Procedure: LEFT HEART CATHETERIZATION WITH CORONARY ANGIOGRAM;  Surgeon: [name], MD;  Location: [facility] CATH LAB;  Service: Cardiovascular;  Laterality: N/A;  . Percutaneous coronary stent intervention (pci-s)  10/07/2013    Procedure: PERCUTANEOUS CORONARY STENT INTERVENTION (PCI-S);  Surgeon: [name], MD;  Location: [facility] CATH LAB;  Service: Cardiovascular;;  . Fractional flow reserve wire  10/07/2013    Procedure: FRACTIONAL FLOW RESERVE WIRE;  Surgeon: [name], MD;  Location: [facility] CATH LAB;  Service: Cardiovascular;;  . Left heart catheterization with coronary angiogram N/A 12/14/2013    Procedure: LEFT HEART CATHETERIZATION WITH CORONARY ANGIOGRAM;  Surgeon: Sinclair Grooms, MD;  Location: [facility] CATH  LAB;  Service: Cardiovascular;  Laterality: N/A;  . Left heart catheterization with coronary angiogram N/A 05/16/2014    Procedure: LEFT HEART CATHETERIZATION WITH CORONARY ANGIOGRAM;  Surgeon: Sherren Mocha, MD;  Location: [facility] CATH LAB;  Service: Cardiovascular;  Laterality: N/A;    Allergies  Allergen Reactions  . Bee Venom Anaphylaxis  . Other Nausea And Vomiting    Pain medications cause severe vomiting. Tolerated slow IV morphine drip    I have reviewed the patient's current medications   . diltiazem (CARDIZEM) infusion 12.5 mg/hr (05/17/15 1428)     Medication Sig  aspirin EC 81 MG tablet Take 324 mg by mouth once.  atorvastatin (LIPITOR) 80 MG tablet TAKE 1 TABLET BY MOUTH EVERY MORNING  beta carotene w/minerals (OCUVITE) tablet Take 2 tablets by mouth daily.  Calcium Carb-Cholecalciferol (CALTRATE 600+D) 600-800 MG-UNIT TABS Take 1 tablet by mouth every morning.   clopidogrel (PLAVIX) 75 MG tablet TAKE 1 TABLET BY MOUTH EVERY DAY WITH  BREAKFAST  Coenzyme Q-10 100 MG capsule Take 200 mg by mouth at bedtime.   diltiazem (CARDIZEM CD) 180 MG 24 hr capsule Take 1 capsule (180 mg total) by mouth daily.  furosemide (LASIX) 40 MG tablet Take 1 tablet (40 mg total) by mouth 2 (two) times daily.  isosorbide mononitrate (IMDUR) 60 MG 24 hr tablet TAKE 1 TABLET BY MOUTH EVERY MORNING AND 1/2 TABLET EVERY EVENING  lisinopril (PRINIVIL,ZESTRIL) 5 MG tablet TAKE 1 TABLET BY MOUTH 2 TIMES DAILY  metoprolol (LOPRESSOR) 100 MG tablet Take 1 tablet (100 mg total) by mouth 2 (two) times daily.  Multiple Vitamin (MULTIVITAMIN) capsule Take 1 capsule by mouth daily.  NITROSTAT 0.4 MG SL tablet PLACE 1 TABLET (0.4 MG TOTAL) UNDER THE TONGUE EVERY 5 (FIVE) MINUTES AS NEEDED. FOR CHEST PAIN  Omega-3 Fatty Acids (FISH OIL PO) Take 1,200 mg by mouth daily.   potassium chloride SA (K-DUR,KLOR-CON) 20 MEQ tablet Take 1 tablet (20 mEq total) by mouth daily.  spironolactone (ALDACTONE) 25 MG tablet 1  tablet daily.May take additional 1/2 tablet daily as needed for edema. If you have to take 2 to 3 times a week contact Dr Irish Lack..  warfarin (COUMADIN) 2 MG tablet Take 1-2 mg by mouth daily. 1mg  on Tuesdays and Fridays. 2mg  all other days.  warfarin (COUMADIN) 2 MG tablet TAKE 0.5-1 TABLETS (1-2 MG TOTAL) BY MOUTH DAILY. AS DIRECTED Patient not taking: Reported on 05/17/2015     Social History   Social History  . Marital Status: Married    Spouse Name: N/A  . Number of Children: N/A  . Years of Education: N/A   Occupational History  . Retired    Social History Main Topics  . Smoking status: Former Smoker -- 1.00 packs/day for 32 years    Types: Cigarettes    Quit date: 02/03/1974  . Smokeless tobacco: Never Used  . Alcohol Use: 3.0 oz/week    5 Glasses of wine per week     Comment: 11/12/2012 "4oz wine w/dinner 5 days/wk"  . Drug Use: No  . Sexual Activity: No   Other Topics Concern  . Not on file   Social History Narrative   Lives with family.    Family Status  Relation Status Death Age  . Mother Deceased 55  . Father Deceased 64  . Maternal Grandmother Deceased   . Maternal Grandfather Deceased   . Paternal Grandmother Deceased   . Paternal Grandfather Deceased    Family History  Problem Relation Age of Onset  . Heart attack Father   . Heart attack Brother   . Stroke Mother   . Diabetes Brother   . Diabetes Father   . Hypertension Brother   . Hypertension Father   . Kidney failure Brother      ROS:  Full 14 point review of systems complete and found to be negative unless listed above.  Physical Exam: Blood pressure 123/81, pulse 130, temperature 97.8 F (36.6 C), temperature source Oral, resp. rate 15, SpO2 97 %.  General: Well developed, well nourished, female in no acute distress Head: Eyes PERRLA, No xanthomas.   Normocephalic and atraumatic, oropharynx without edema or exudate. Dentition: poor Lungs: decreased BS bases, very few rales Heart:  Heart irregular rate and rhythm with S1, S2, no murmur appreciated. pulses are 2+ all 4 extrem.   Neck: No carotid bruits. No lymphadenopathy.  JVD not elevated Abdomen: Bowel sounds present, abdomen soft and non-tender without masses or hernias noted. Msk:  No spine or cva tenderness. No weakness, no joint deformities or effusions. Extremities: No clubbing or cyanosis. No edema.  Neuro: Alert and oriented X 3. No focal deficits noted. Psych:  Good affect, responds appropriately Skin: No rashes or lesions noted.  Labs:   Lab Results  Component Value Date   WBC 6.6 05/17/2015   HGB 13.4 05/17/2015   HCT 39.9 05/17/2015   MCV 93.7 05/17/2015   PLT 231 05/17/2015    Recent Labs  05/17/15 1140  INR 2.51*     Recent Labs Lab 05/17/15 1140  NA 139  K 4.7  CL 101  CO2 27  BUN 32*  CREATININE 1.36*  CALCIUM 8.8*  GLUCOSE 115*    Recent Labs  05/17/15 1140  TROPONINI 0.03   B NATRIURETIC PEPTIDE  Date/Time Value Ref Range Status  05/17/2015 11:40 AM 129.9* 0.0 - 100.0 pg/mL Final  05/14/2014 05:40 AM 386.6* 0.0 - 100.0 pg/mL Final   Lab Results  Component Value Date   CHOL 136 01/02/2015   HDL 62 01/02/2015   LDLCALC 61 01/02/2015   TRIG 67 01/02/2015   Echo: 05/2014. - Left ventricle: Abnormal septal motion The cavity size was normal. Wall thickness was increased in a pattern of moderate LVH. Systolic function was mildly reduced. The estimated ejection fraction was in the range of 45% to 50%. Diffuse hypokinesis. - Left atrium: The atrium was mildly dilated.  ECG: 05/17/2015 Atypical Atrial flutter with 2:1 conduction, left bundle branch block is old  Radiology:  Dg Chest Portable 1 View 05/17/2015  CLINICAL DATA:  Chest pain EXAM: PORTABLE CHEST 1 VIEW COMPARISON:  05/14/2014 FINDINGS: Mild cardiac enlargement without heart failure. Lungs are clear without infiltrate or effusion. Negative for mass lesion. IMPRESSION: No active disease. Electronically  Signed   By: Franchot Gallo M.D.   On: 05/17/2015 12:01    ASSESSMENT AND PLAN:   The patient was seen today by Dr Percival Spanish, the patient evaluated and the data reviewed.  Principal Problem: 1.  Unstable angina pectoris ([name]) - Admit, increase Imdur - hold coumadin overnight and add heparin when INR < 2.0 - If ez remain low/negative, no further workup.  Active Problems: 2.  Atrial fibrillation with RVR ([name]) - rate control w/ IV Cardizem for now.  - if no cath needed, increase home dose 180 mg>>240 mg - continue to follow for rate control    HTN (hypertension) - continue home rx    Chronic anticoagulation - coumadin, CHADS2VASC=6 - hold overnight, see above   SignedRosaria Ferries, PA-C 05/17/2015 2:58 PM Beeper [id number]  Co-Sign MD   History and all data above reviewed.  Patient examined.  I agree with the findings as above.  The patient presents with chest pain and atrial flutter with 2:1 conduction.  This is similar to previous presentations.  She has not been able to tell in the past (or this time) which comes first (pain or rapid rate).  She is currently more comfortable as her rate has slowed.  Initial enzymes are negative.    The patient exam reveals COR: Irregular  ,  Lungs: Clear  ,  Abd: Positive bowel sounds, no rebound no guarding, Ext No edema  .  All available labs, radiology testing, previous records reviewed. Agree with documented assessment and plan. Chest pain:  It is unclear whether this is secondary to the atrial flutter.  At this point I will observe overnight and keep on IV Cardizem.  I will switch to an  increased dose of PO in the AM.  Also increase Imdur to 120 mg daily.  If her rate improves, she is able to ambulate without pain and she does not have a clear enzyme trend, I would manage her medically.    Jeneen Rinks Wenzel Backlund  3:44 PM  05/17/2015

## 2015-05-17 NOTE — ED Provider Notes (Signed)
CSN: [id number]     Arrival date & time 05/17/15  1125 History   First MD Initiated Contact with Patient 05/17/15 1135     Chief Complaint  Patient presents with  . Chest Pain  . Tachycardia     (Consider location/radiation/quality/duration/timing/severity/associated sxs/prior Treatment) HPI Comments: Patient arrives via EMS with acute onset of central chest pain that woke her from sleep this a.m. It radiates to her neck, jaws and bilateral shoulders. It is typical of her previous angina. She took 3 nitroglycerin without relief of pain. EMS gave her aspirin. Patient has an internal pacemaker but no defibrillator. States pain is typical of her previous angina. Felt well going to bed last night. States compliance with her medications. Denies feeling any palpitations. Does feel some dizziness and lightheadedness when she tries to get up.  The history is provided by the patient and the EMS personnel. The history is limited by the condition of the patient.    Past Medical History  Diagnosis Date  . Hypertension   . Hyperlipidemia   . Obesity   . Diverticulitis   . Peripheral vascular disease ([place])   . Varicose veins   . Granulosa cell carcinoma ([place])     abd; last episode was in 2009  . Tachycardia-bradycardia syndrome ([place])     a. s/p leadless pacemaker ([name]) implanted by Dr [name]  . Permanent atrial fibrillation ([place]) 2013  . OSA on CPAP   . Combined systolic and diastolic heart failure, NYHA class 3 ([place])   . Coronary artery disease     a. s/p multiple stents  b. LHC 12/14/13 with sig CAD with TO of OM1, 70-80% occusion OM2, 50-70% mLCx, 50-70% pLAD (recent FFR 0.83), diffusely diseased RCA, widely patent stetns in pLAD and LCx.  Marland Kitchen LBBB (left bundle branch block)        . Myocardial infarction ([place]) 2002  . Renal artery stenosis [facility])    Past Surgical History  Procedure Laterality Date  . Cholecystectomy  1980's  . Salivary gland surgery  2000's    "had a little lump  removed; granulosa related; it was benign" (08/11/2012)  . Abdominal hysterectomy    . Colon surgery  2004    colectomy for diverticulosis  . Granulosa tumor excision  2000; 2003; 2004; 2007    "all in my abdomen including small intestines, outside my ?uterus/etc" (08/11/2012)  . Tee without cardioversion  12/31/2011    Procedure: TRANSESOPHAGEAL ECHOCARDIOGRAM (TEE);  Surgeon: [name], MD;  Location: [place];  Service: Cardiovascular;  Laterality: N/A;  . Cardioversion  12/31/2011    Procedure: CARDIOVERSION;  Surgeon: [name], MD;  Location: [place];  Service: Cardiovascular;  Laterality: N/A;  . Cardiac catheterization  09/03/2007    EF 70%; Failed attempt at PCI to OM  . Cardiac catheterization  11/01/2003    EF 70%  . Coronary angioplasty with stent placement  2000; 08/11/2012; 11/12/2012    3 + 2 LAD & CFX; 2nd CFX stent 11/12/2012  . Varicose vein surgery Bilateral 1977  . Hernia repair  2005    "laparoscopic"  . Cataract extraction, bilateral  2015  . Cardioversion N/A 12/31/2011    Procedure: CARDIOVERSION;  Surgeon: [name], MD;  Location: [facility] CATH LAB;  Service: Cardiovascular;  Laterality: N/A;  . Permanent pacemaker insertion N/A 03/16/2012    [name] (SJM) leadless pacemaker (LEADLESS II STUDY PATEINT)  . Percutaneous coronary intervention-balloon only  08/04/2012    Procedure: PERCUTANEOUS CORONARY INTERVENTION-BALLOON  ONLY;  Surgeon: [name], MD;  Location: [facility] CATH LAB;  Service: Cardiovascular;;  . Percutaneous coronary rotoblator intervention (pci-r) N/A 08/11/2012    Procedure: PERCUTANEOUS CORONARY ROTOBLATOR INTERVENTION (PCI-R);  Surgeon: [name], MD;  Location: [facility] CATH LAB;  Service: Cardiovascular;  Laterality: N/A;  . Left heart catheterization with coronary angiogram N/A 11/12/2012    Procedure: LEFT HEART CATHETERIZATION WITH CORONARY ANGIOGRAM;  Surgeon: [name], MD;  Location: [facility] CATH LAB;   Service: Cardiovascular;  Laterality: N/A;  . Left heart catheterization with coronary angiogram N/A 10/07/2013    Procedure: LEFT HEART CATHETERIZATION WITH CORONARY ANGIOGRAM;  Surgeon: [name], MD;  Location: [facility] CATH LAB;  Service: Cardiovascular;  Laterality: N/A;  . Percutaneous coronary stent intervention (pci-s)  10/07/2013    Procedure: PERCUTANEOUS CORONARY STENT INTERVENTION (PCI-S);  Surgeon: [name], MD;  Location: [facility] C[facility] LAB;  Service: Cardiovascular;;  . Fractional flow reserve wire  10/07/2013    Procedure: FRACTIONAL FLOW RESERVE WIRE;  Surgeon: [name], MD;  Location: [facility] CATH LAB;  Service: Cardiovascular;;  . Left heart catheterization with coronary angiogram N/A 12/14/2013    Procedure: LEFT HEART CATHETERIZATION WITH CORONARY ANGIOGRAM;  Surgeon: Sinclair Grooms, MD;  Location: [facility] CATH LAB;  Service: Cardiovascular;  Laterality: N/A;  . Left heart catheterization with coronary angiogram N/A 05/16/2014    Procedure: LEFT HEART CATHETERIZATION WITH CORONARY ANGIOGRAM;  Surgeon: Sherren Mocha, MD;  Location: [facility] CATH LAB;  Service: Cardiovascular;  Laterality: N/A;   Family History  Problem Relation Age of Onset  . Heart attack Father   . Heart attack Brother   . Stroke Mother   . Diabetes Brother   . Diabetes Father   . Hypertension Brother   . Hypertension Father   . Kidney failure Brother    Social History  Substance Use Topics  . Smoking status: Former Smoker -- 1.00 packs/day for 32 years    Types: Cigarettes    Quit date: 02/03/1974  . Smokeless tobacco: Never Used  . Alcohol Use: 3.0 oz/week    5 Glasses of wine per week     Comment: 11/12/2012 "4oz wine w/dinner 5 days/wk"   OB History    No data available     Review of Systems  Constitutional: Positive for fatigue. Negative for activity change and appetite change.  Respiratory: Positive for chest tightness and shortness of breath. Negative for cough.   Cardiovascular:  Positive for chest pain.  Gastrointestinal: Negative for nausea, vomiting and abdominal pain.  Genitourinary: Negative for dysuria, hematuria, vaginal bleeding and vaginal discharge.  Musculoskeletal: Negative for myalgias and arthralgias.  Skin: Negative for rash.  Neurological: Positive for dizziness and light-headedness. Negative for weakness.  A complete 10 system review of systems was obtained and all systems are negative except as noted in the HPI and PMH.      Allergies  Bee venom and Other  Home Medications   Prior to Admission medications   Medication Sig Start Date End Date Taking? Authorizing Provider  aspirin EC 81 MG tablet Take 324 mg by mouth once.   Yes Historical Provider, MD  atorvastatin (LIPITOR) 80 MG tablet TAKE 1 TABLET BY MOUTH EVERY MORNING 01/24/15  Yes [name], MD  beta carotene w/minerals (OCUVITE) tablet Take 2 tablets by mouth daily.   Yes Historical Provider, MD  Calcium Carb-Cholecalciferol (CALTRATE 600+D) 600-800 MG-UNIT TABS Take 1 tablet by mouth every morning.    Yes Historical Provider,  MD  clopidogrel (PLAVIX) 75 MG tablet TAKE 1 TABLET BY MOUTH EVERY DAY WITH BREAKFAST 05/07/15  Yes [name], MD  Coenzyme Q-10 100 MG capsule Take 200 mg by mouth at bedtime.    Yes Historical Provider, MD  diltiazem (CARDIZEM CD) 180 MG 24 hr capsule Take 1 capsule (180 mg total) by mouth daily. 11/17/14  Yes Thompson Grayer, MD  furosemide (LASIX) 40 MG tablet Take 1 tablet (40 mg total) by mouth 2 (two) times daily. 04/18/15  Yes [name], MD  isosorbide mononitrate (IMDUR) 60 MG 24 hr tablet TAKE 1 TABLET BY MOUTH EVERY MORNING AND 1/2 TABLET EVERY EVENING 11/24/14  Yes [name], MD  lisinopril (PRINIVIL,ZESTRIL) 5 MG tablet TAKE 1 TABLET BY MOUTH 2 TIMES DAILY 01/24/15  Yes [name], MD  metoprolol (LOPRESSOR) 100 MG tablet Take 1 tablet (100 mg total) by mouth 2 (two) times daily. 12/18/14  Yes Thompson Grayer, MD   Multiple Vitamin (MULTIVITAMIN) capsule Take 1 capsule by mouth daily.   Yes Historical Provider, MD  NITROSTAT 0.4 MG SL tablet PLACE 1 TABLET (0.4 MG TOTAL) UNDER THE TONGUE EVERY 5 (FIVE) MINUTES AS NEEDED. FOR CHEST PAIN 03/30/15  Yes [name], MD  Omega-3 Fatty Acids (FISH OIL PO) Take 1,200 mg by mouth daily.    Yes Historical Provider, MD  potassium chloride SA (K-DUR,KLOR-CON) 20 MEQ tablet Take 1 tablet (20 mEq total) by mouth daily. 03/12/15  Yes [name], MD  spironolactone (ALDACTONE) 25 MG tablet 1 tablet daily.May take additional 1/2 tablet daily as needed for edema. If you have to take 2 to 3 times a week contact Dr Irish Lack.. Patient taking differently: Take 25 mg by mouth daily. May take additional 1/2 tablet daily as needed for edema. If you have to take 2 to 3 times a week contact Dr Irish Lack.. 04/18/15  Yes [name], MD  warfarin (COUMADIN) 2 MG tablet Take 1-2 mg by mouth daily. 1mg  on Tuesdays and Fridays. 2mg  all other days.   Yes Historical Provider, MD  warfarin (COUMADIN) 2 MG tablet TAKE 0.5-1 TABLETS (1-2 MG TOTAL) BY MOUTH DAILY. AS DIRECTED Patient not taking: Reported on 05/17/2015 01/24/15   [name], MD   BP 163/96 mmHg  Pulse 131  Temp(Src) 97.8 F (36.6 C) (Oral)  Resp 20  SpO2 98% Physical Exam  Constitutional: She is oriented to person, place, and time. She appears well-developed and well-nourished. No distress.  HENT:  Head: Normocephalic and atraumatic.  Mouth/Throat: Oropharynx is clear and moist. No oropharyngeal exudate.  Dark lesion left lower lip  Eyes: Conjunctivae and EOM are normal. Pupils are equal, round, and reactive to light.  Neck: Normal range of motion. Neck supple.  No meningismus.  Cardiovascular: Normal rate, regular rhythm, normal heart sounds and intact distal pulses.   No murmur heard. Tachycardic 130s  Pulmonary/Chest: Effort normal and breath sounds normal. No respiratory distress.   Abdominal: Soft. There is no tenderness. There is no rebound and no guarding.  Musculoskeletal: Normal range of motion. She exhibits no edema or tenderness.  Neurological: She is alert and oriented to person, place, and time. No cranial nerve deficit. She exhibits normal muscle tone. Coordination normal.  No ataxia on finger to nose bilaterally. No pronator drift. 5/5 strength throughout. CN 2-12 intact.Equal grip strength. Sensation intact.   Skin: Skin is warm.  Psychiatric: She has a normal mood and affect. Her behavior is normal.  Nursing note  and vitals reviewed.   ED Course  Procedures (including critical care time) Labs Review Labs Reviewed  PROTIME-INR - Abnormal; Notable for the following:    Prothrombin Time 26.7 (*)    INR 2.51 (*)    All other components within normal limits  CBC WITH DIFFERENTIAL/PLATELET  BASIC METABOLIC PANEL  BRAIN NATRIURETIC PEPTIDE  TROPONIN I  URINALYSIS, ROUTINE W REFLEX MICROSCOPIC (NOT AT [facility])    Imaging Review Dg Chest Portable 1 View  05/17/2015  CLINICAL DATA:  Chest pain EXAM: PORTABLE CHEST 1 VIEW COMPARISON:  05/14/2014 FINDINGS: Mild cardiac enlargement without heart failure. Lungs are clear without infiltrate or effusion. Negative for mass lesion. IMPRESSION: No active disease. Electronically Signed   By: Franchot Gallo M.D.   On: 05/17/2015 12:01   I have personally reviewed and evaluated these images and lab results as part of my medical decision-making.   EKG Interpretation   Date/Time:  Thursday May 17 2015 11:26:17 EDT Ventricular Rate:  132 PR Interval:    QRS Duration: 153 QT Interval:  396 QTC Calculation: 587 R Axis:   125 Text Interpretation:  regular tachycardia, no P waves seen Nonspecific  intraventricular conduction delay Similar anterior ST elevation and LBBB  ST depression inferiorly Confirmed by Wyvonnia Dusky  MD, Hayward Rylander [phone]) on  05/17/2015 12:14:28 PM      MDM   Final diagnoses:  None   Central  chest pain that woke from sleep this morning associated with nausea, vomiting, radiation to the shoulders and jaw, shortness of breath. Now pain-free after 3 nitroglycerin.  Tachycardic to the 130s with left bundle blanch block pattern, suspect atrial flutter.  EKG with ST depression inferiorly with similar ST elevation anterior leads. Discussed with Dr. [place] who agrees this is not a code STEMI.  Labs reassuring. Troponin negative. INR therapeutic.    IV cardizem gtt for atrial fibrillation/flutter.  LBBB pattern is similar to previous and does not appear to be VT.  Remains chest pain free in the ED.  HR elevated to 130s with movement.  D/w Cardiology  Plan admission for unstable angina and uncontrolled atrial flutter.  CRITICAL CARE Performed by: Ezequiel Essex Total critical care time: 70minutes Critical care time was exclusive of separately billable procedures and treating other patients. Critical care was necessary to treat or prevent imminent or life-threatening deterioration. Critical care was time spent personally by me on the following activities: development of treatment plan with patient and/or surrogate as well as nursing, discussions with consultants, evaluation of patient's response to treatment, examination of patient, obtaining history from patient or surrogate, ordering and performing treatments and interventions, ordering and review of laboratory studies, ordering and review of radiographic studies, pulse oximetry and re-evaluation of patient's condition.    Ezequiel Essex, MD 05/17/15 1730

## 2015-05-17 NOTE — Progress Notes (Signed)
[place] for Heparin Indication: atrial fibrillation  Allergies  Allergen Reactions  . Bee Venom Anaphylaxis  . Other Nausea And Vomiting    Pain medications cause severe vomiting. Tolerated slow IV morphine drip    Patient Measurements: Height: 5' (152.4 cm) Weight: 176 lb 1.6 oz (79.878 kg) IBW/kg (Calculated) : 45.5 Heparin Dosing Weight: 64 kg  Vital Signs: Temp: 98.3 F (36.8 C) (04/13 1730) Temp Source: Oral (04/13 1730) BP: 121/67 mmHg (04/13 1730) Pulse Rate: 60 (04/13 1730)  Labs:  Recent Labs  05/17/15 1140  HGB 13.4  HCT 39.9  PLT 231  LABPROT 26.7*  INR 2.51*  CREATININE 1.36*  TROPONINI 0.03    Estimated Creatinine Clearance: 29.9 mL/min (by C-G formula based on Cr of 1.36).   Medical History: Past Medical History  Diagnosis Date  . Hypertension   . Hyperlipidemia   . Obesity   . Diverticulitis   . Peripheral vascular disease ([place])   . Varicose veins   . Granulosa cell carcinoma ([place])     abd; last episode was in 2009  . Tachycardia-bradycardia syndrome ([place])     a. s/p leadless pacemaker ([name]) implanted by Dr [name]  . Permanent atrial fibrillation ([place]) 2013  . OSA on CPAP   . Combined systolic and diastolic heart failure, NYHA class 3 ([place])   . Coronary artery disease     a. s/p multiple stents  b. LHC 12/14/13 with sig CAD with TO of OM1, 70-80% occusion OM2, 50-70% mLCx, 50-70% pLAD (recent FFR 0.83), diffusely diseased RCA, widely patent stetns in pLAD and LCx.  Marland Kitchen LBBB (left bundle branch block)        . Myocardial infarction ([place]) 2002  . Renal artery stenosis ([place])   . Pacemaker - [employer] Jude Leadless PPM   . Chronic anticoagulation - coumadin, CHADS2VASC=6 05/17/2015    Medications:  Prescriptions prior to admission  Medication Sig Dispense Refill Last Dose  . aspirin EC 81 MG tablet Take 324 mg by mouth once.   05/17/2015 at Unknown time  . atorvastatin (LIPITOR) 80  MG tablet TAKE 1 TABLET BY MOUTH EVERY MORNING 30 tablet 3 05/16/2015 at Unknown time  . beta carotene w/minerals (OCUVITE) tablet Take 2 tablets by mouth daily.   05/17/2015 at Unknown time  . Calcium Carb-Cholecalciferol (CALTRATE 600+D) 600-800 MG-UNIT TABS Take 1 tablet by mouth every morning.    05/16/2015 at Unknown time  . clopidogrel (PLAVIX) 75 MG tablet TAKE 1 TABLET BY MOUTH EVERY DAY WITH BREAKFAST 30 tablet 11 05/17/2015 at Unknown time  . Coenzyme Q-10 100 MG capsule Take 200 mg by mouth at bedtime.    05/16/2015 at Unknown time  . diltiazem (CARDIZEM CD) 180 MG 24 hr capsule Take 1 capsule (180 mg total) by mouth daily. 90 capsule 3 05/16/2015 at Unknown time  . furosemide (LASIX) 40 MG tablet Take 1 tablet (40 mg total) by mouth 2 (two) times daily. 180 tablet 3 05/17/2015 at Unknown time  . isosorbide mononitrate (IMDUR) 60 MG 24 hr tablet TAKE 1 TABLET BY MOUTH EVERY MORNING AND 1/2 TABLET EVERY EVENING 45 tablet 6 05/16/2015 at Unknown time  . lisinopril (PRINIVIL,ZESTRIL) 5 MG tablet TAKE 1 TABLET BY MOUTH 2 TIMES DAILY 60 tablet 3 05/16/2015 at Unknown time  . metoprolol (LOPRESSOR) 100 MG tablet Take 1 tablet (100 mg total) by mouth 2 (two) times daily. 60 tablet 11 05/16/2015 at 1800  . Multiple Vitamin (MULTIVITAMIN) capsule  Take 1 capsule by mouth daily.   05/16/2015 at Unknown time  . NITROSTAT 0.4 MG SL tablet PLACE 1 TABLET (0.4 MG TOTAL) UNDER THE TONGUE EVERY 5 (FIVE) MINUTES AS NEEDED. FOR CHEST PAIN 25 tablet 2 05/17/2015 at Unknown time  . Omega-3 Fatty Acids (FISH OIL PO) Take 1,200 mg by mouth daily.    05/16/2015 at Unknown time  . potassium chloride SA (K-DUR,KLOR-CON) 20 MEQ tablet Take 1 tablet (20 mEq total) by mouth daily. 30 tablet 6 05/16/2015 at Unknown time  . spironolactone (ALDACTONE) 25 MG tablet 1 tablet daily.May take additional 1/2 tablet daily as needed for edema. If you have to take 2 to 3 times a week contact Dr Irish Lack.. (Patient taking differently: Take 25 mg  by mouth daily. May take additional 1/2 tablet daily as needed for edema. If you have to take 2 to 3 times a week contact Dr Irish Lack.Marland Kitchen) 45 tablet 11 05/16/2015 at Unknown time  . warfarin (COUMADIN) 2 MG tablet Take 1-2 mg by mouth daily. 1mg  on Tuesdays and Fridays. 2mg  all other days.   05/16/2015 at Unknown time  . warfarin (COUMADIN) 2 MG tablet TAKE 0.5-1 TABLETS (1-2 MG TOTAL) BY MOUTH DAILY. AS DIRECTED (Patient not taking: Reported on 05/17/2015) 30 tablet 3 Not Taking at Unknown time   Scheduled:  . [START ON 05/18/2015] atorvastatin  80 mg Oral q morning - 10a  . beta carotene w/minerals  2 tablet Oral Daily  . [START ON 05/18/2015] calcium-vitamin D  1 tablet Oral q morning - 10a  . clopidogrel  75 mg Oral Daily  . furosemide  40 mg Oral BID  . [START ON 05/18/2015] isosorbide mononitrate  120 mg Oral Daily  . lisinopril  2.5 mg Oral BID  . metoprolol  100 mg Oral BID  . multivitamin with minerals  1 tablet Oral Daily  . potassium chloride SA  20 mEq Oral Daily  . sodium chloride flush  3 mL Intravenous Q12H  . spironolactone  25 mg Oral Daily   Infusions:  . diltiazem (CARDIZEM) infusion 15 mg/hr (05/17/15 1500)    Assessment: 82yo female with history of Afib on warfarin presents with CP and tachycardia. Pharmacy is consulted to dose heparin for atrial fibrillation once INR is less than 2.0. INR 2.51, CBC wnl, sCr 1.36, trop neg x1, BNP 130.  Goal of Therapy:  Heparin level 0.3-0.7 units/ml Monitor platelets by anticoagulation protocol: Yes   Plan:  Start heparin once INR < 2 Daily INR Monitor s/sx of bleeding  Andrey Cota. Diona Foley, PharmD, BCPS Clinical Pharmacist Pager [phone] 05/17/2015,5:41 PM

## 2015-05-17 NOTE — Progress Notes (Addendum)
Patients heart rate 50-60.  IV cardizem stopped.  Maude Leriche PA paged.    No call returned.  Will continue to monitor.  Will pass along to nightshift RN

## 2015-05-17 NOTE — ED Notes (Signed)
Cardiology PA at bedside. 

## 2015-05-17 NOTE — ED Notes (Signed)
Patient placed on zoll pads, STEMI MD called per Dr. Vevelyn Francois request

## 2015-05-17 NOTE — ED Notes (Signed)
Pt arrives via [facility] for c/o chest pain. Ems reports patient woke up this am with chest pain which she has often, pt usually takes 1 sl nitro and gets relief, pt took a total of 3 sl nitro this am with no relief of chest pain. Pt reported pain radiated to both shoulders and her jaw. EMS gave 324mg  asa pta. Pt has an internal pacemaker that she reports she has had for 3 years. Pt a/o x4, resp e/u, nad.

## 2015-05-18 DIAGNOSIS — I110 Hypertensive heart disease with heart failure: Secondary | ICD-10-CM | POA: Diagnosis present

## 2015-05-18 DIAGNOSIS — I447 Left bundle-branch block, unspecified: Secondary | ICD-10-CM | POA: Diagnosis present

## 2015-05-18 DIAGNOSIS — I200 Unstable angina: Secondary | ICD-10-CM | POA: Diagnosis not present

## 2015-05-18 DIAGNOSIS — I214 Non-ST elevation (NSTEMI) myocardial infarction: Secondary | ICD-10-CM | POA: Diagnosis not present

## 2015-05-18 DIAGNOSIS — G4733 Obstructive sleep apnea (adult) (pediatric): Secondary | ICD-10-CM | POA: Diagnosis present

## 2015-05-18 DIAGNOSIS — E785 Hyperlipidemia, unspecified: Secondary | ICD-10-CM | POA: Diagnosis present

## 2015-05-18 DIAGNOSIS — Z888 Allergy status to other drugs, medicaments and biological substances status: Secondary | ICD-10-CM | POA: Diagnosis not present

## 2015-05-18 DIAGNOSIS — I25110 Atherosclerotic heart disease of native coronary artery with unstable angina pectoris: Secondary | ICD-10-CM | POA: Diagnosis not present

## 2015-05-18 DIAGNOSIS — I5042 Chronic combined systolic (congestive) and diastolic (congestive) heart failure: Secondary | ICD-10-CM | POA: Diagnosis not present

## 2015-05-18 DIAGNOSIS — Z841 Family history of disorders of kidney and ureter: Secondary | ICD-10-CM | POA: Diagnosis not present

## 2015-05-18 DIAGNOSIS — Z833 Family history of diabetes mellitus: Secondary | ICD-10-CM | POA: Diagnosis not present

## 2015-05-18 DIAGNOSIS — Z87891 Personal history of nicotine dependence: Secondary | ICD-10-CM | POA: Diagnosis not present

## 2015-05-18 DIAGNOSIS — Z955 Presence of coronary angioplasty implant and graft: Secondary | ICD-10-CM | POA: Diagnosis not present

## 2015-05-18 DIAGNOSIS — Z90710 Acquired absence of both cervix and uterus: Secondary | ICD-10-CM | POA: Diagnosis not present

## 2015-05-18 DIAGNOSIS — I482 Chronic atrial fibrillation: Secondary | ICD-10-CM | POA: Diagnosis present

## 2015-05-18 DIAGNOSIS — Z823 Family history of stroke: Secondary | ICD-10-CM | POA: Diagnosis not present

## 2015-05-18 DIAGNOSIS — I4892 Unspecified atrial flutter: Secondary | ICD-10-CM | POA: Diagnosis not present

## 2015-05-18 DIAGNOSIS — I480 Paroxysmal atrial fibrillation: Secondary | ICD-10-CM | POA: Diagnosis present

## 2015-05-18 DIAGNOSIS — Z8249 Family history of ischemic heart disease and other diseases of the circulatory system: Secondary | ICD-10-CM | POA: Diagnosis not present

## 2015-05-18 DIAGNOSIS — Z7982 Long term (current) use of aspirin: Secondary | ICD-10-CM | POA: Diagnosis not present

## 2015-05-18 DIAGNOSIS — Z9049 Acquired absence of other specified parts of digestive tract: Secondary | ICD-10-CM | POA: Diagnosis not present

## 2015-05-18 DIAGNOSIS — Z79899 Other long term (current) drug therapy: Secondary | ICD-10-CM | POA: Diagnosis not present

## 2015-05-18 DIAGNOSIS — I739 Peripheral vascular disease, unspecified: Secondary | ICD-10-CM | POA: Diagnosis present

## 2015-05-18 DIAGNOSIS — I4891 Unspecified atrial fibrillation: Secondary | ICD-10-CM | POA: Diagnosis not present

## 2015-05-18 DIAGNOSIS — Z91030 Bee allergy status: Secondary | ICD-10-CM | POA: Diagnosis not present

## 2015-05-18 DIAGNOSIS — I252 Old myocardial infarction: Secondary | ICD-10-CM | POA: Diagnosis not present

## 2015-05-18 DIAGNOSIS — Z7901 Long term (current) use of anticoagulants: Secondary | ICD-10-CM | POA: Diagnosis not present

## 2015-05-18 LAB — COMPREHENSIVE METABOLIC PANEL
ALBUMIN: 3.2 g/dL — AB (ref 3.5–5.0)
ALT: 20 U/L (ref 14–54)
ANION GAP: 10 (ref 5–15)
AST: 31 U/L (ref 15–41)
Alkaline Phosphatase: 86 U/L (ref 38–126)
BILIRUBIN TOTAL: 0.9 mg/dL (ref 0.3–1.2)
BUN: 28 mg/dL — AB (ref 6–20)
CO2: 28 mmol/L (ref 22–32)
Calcium: 8.9 mg/dL (ref 8.9–10.3)
Chloride: 104 mmol/L (ref 101–111)
Creatinine, Ser: 1.36 mg/dL — ABNORMAL HIGH (ref 0.44–1.00)
GFR calc Af Amer: 41 mL/min — ABNORMAL LOW (ref >60)
GFR calc non Af Amer: 35 mL/min — ABNORMAL LOW (ref >60)
GLUCOSE: 106 mg/dL — AB (ref 65–99)
POTASSIUM: 3.8 mmol/L (ref 3.5–5.1)
SODIUM: 142 mmol/L (ref 135–145)
TOTAL PROTEIN: 6.3 g/dL — AB (ref 6.5–8.1)

## 2015-05-18 LAB — PROTIME-INR
INR: 2.44 — AB (ref 0.00–1.49)
PROTHROMBIN TIME: 26.2 s — AB (ref 11.6–15.2)

## 2015-05-18 LAB — HEMOGLOBIN A1C
HEMOGLOBIN A1C: 6 % — AB (ref 4.8–5.6)
MEAN PLASMA GLUCOSE: 126 mg/dL

## 2015-05-18 LAB — TROPONIN I
Troponin I: 0.37 ng/mL — ABNORMAL HIGH (ref <0.031)
Troponin I: 0.4 ng/mL — ABNORMAL HIGH (ref <0.031)

## 2015-05-18 MED ORDER — ASPIRIN 81 MG PO CHEW: 81.0000 mg | CHEWABLE_TABLET | ORAL | Status: DC

## 2015-05-18 MED ORDER — SODIUM CHLORIDE 0.9% FLUSH
3.0000 mL | Freq: Two times a day (BID) | INTRAVENOUS | Status: DC
  Administered 2015-05-18: 3 mL via INTRAVENOUS

## 2015-05-18 MED ORDER — SODIUM CHLORIDE 0.9 % WEIGHT BASED INFUSION: 1.0000 mL/kg/h | INTRAVENOUS | Status: DC

## 2015-05-18 MED ORDER — SODIUM CHLORIDE 0.9% FLUSH: 3.0000 mL | INTRAVENOUS | Status: DC | PRN

## 2015-05-18 MED ORDER — PHYTONADIONE 5 MG PO TABS
10.0000 mg | ORAL_TABLET | Freq: Once | ORAL | Status: AC
  Administered 2015-05-18: 10 mg via ORAL
  Filled 2015-05-18: qty 2

## 2015-05-18 MED ORDER — FUROSEMIDE 40 MG PO TABS
40.0000 mg | ORAL_TABLET | Freq: Two times a day (BID) | ORAL | Status: DC
  Administered 2015-05-19: 40 mg via ORAL
  Filled 2015-05-18: qty 1

## 2015-05-18 MED ORDER — DILTIAZEM HCL ER COATED BEADS 240 MG PO CP24
240.0000 mg | ORAL_CAPSULE | Freq: Every day | ORAL | Status: DC
  Administered 2015-05-18: 240 mg via ORAL
  Filled 2015-05-18: qty 1

## 2015-05-18 MED ORDER — PROSIGHT PO TABS
2.0000 | ORAL_TABLET | Freq: Every day | ORAL | Status: DC
  Administered 2015-05-18 – 2015-05-19 (×2): 2 via ORAL
  Filled 2015-05-18 (×2): qty 2

## 2015-05-18 MED ORDER — METOPROLOL TARTRATE 100 MG PO TABS
100.0000 mg | ORAL_TABLET | Freq: Two times a day (BID) | ORAL | Status: DC
  Administered 2015-05-19: 100 mg via ORAL
  Filled 2015-05-18 (×2): qty 1

## 2015-05-18 MED ORDER — SODIUM CHLORIDE 0.9 % IV SOLN: 250.0000 mL | INTRAVENOUS | Status: DC | PRN

## 2015-05-18 MED ORDER — SODIUM CHLORIDE 0.9 % WEIGHT BASED INFUSION: 3.0000 mL/kg/h | INTRAVENOUS | Status: AC

## 2015-05-18 MED ORDER — DILTIAZEM HCL ER COATED BEADS 240 MG PO CP24
240.0000 mg | ORAL_CAPSULE | Freq: Every day | ORAL | Status: DC
  Administered 2015-05-19: 240 mg via ORAL
  Filled 2015-05-18: qty 1

## 2015-05-18 NOTE — Progress Notes (Signed)
BP = 79/53, HR = 50, c/o dizziness sitting up to side of bed. Pt laying back down and foot of bed elevated. BP = 83/58, HR  = 51. [place] Silos, [place] notified. Will continue to monitor.

## 2015-05-18 NOTE — Progress Notes (Addendum)
SUBJECTIVE:  No further chest pain.  No SOB.     PHYSICAL EXAM Filed Vitals:   05/17/15 1928 05/17/15 2000 05/18/15 0218 05/18/15 0407  BP:  126/61    Pulse:      Temp: 99.3 F (37.4 C)   98.4 F (36.9 C)  TempSrc: Oral   Oral  Resp:  26    Height:      Weight:   175 lb 14.4 oz (79.788 kg)   SpO2:       General:  No distress Lungs:  Clear Heart:    Irregular Abdomen:  Positive bowel sounds, no rebound no guarding Extremities:  No edema  Neuro:  Nonfocal.    LABS: Lab Results  Component Value Date   TROPONINI 0.37* 05/18/2015   Results for orders placed or performed during the hospital encounter of 05/17/15 (from the past 24 hour(s))  CBC with Differential/Platelet     Status: None   Collection Time: 05/17/15 11:40 AM  Result Value Ref Range   WBC 6.6 4.0 - 10.5 K/uL   RBC 4.26 3.87 - 5.11 MIL/uL   Hemoglobin 13.4 12.0 - 15.0 g/dL   HCT 39.9 36.0 - 46.0 %   MCV 93.7 78.0 - 100.0 fL   MCH 31.5 26.0 - 34.0 pg   MCHC 33.6 30.0 - 36.0 g/dL   RDW 14.2 11.5 - 15.5 %   Platelets 231 150 - 400 K/uL   Neutrophils Relative % 72 %   Neutro Abs 4.8 1.7 - 7.7 K/uL   Lymphocytes Relative 17 %   Lymphs Abs 1.2 0.7 - 4.0 K/uL   Monocytes Relative 9 %   Monocytes Absolute 0.6 0.1 - 1.0 K/uL   Eosinophils Relative 2 %   Eosinophils Absolute 0.1 0.0 - 0.7 K/uL   Basophils Relative 0 %   Basophils Absolute 0.0 0.0 - 0.1 K/uL  Basic metabolic panel     Status: Abnormal   Collection Time: 05/17/15 11:40 AM  Result Value Ref Range   Sodium 139 135 - 145 mmol/L   Potassium 4.7 3.5 - 5.1 mmol/L   Chloride 101 101 - 111 mmol/L   CO2 27 22 - 32 mmol/L   Glucose, Bld 115 (H) 65 - 99 mg/dL   BUN 32 (H) 6 - 20 mg/dL   Creatinine, Ser 1.36 (H) 0.44 - 1.00 mg/dL   Calcium 8.8 (L) 8.9 - 10.3 mg/dL   GFR calc non Af Amer 35 (L) >60 mL/min   GFR calc Af Amer 41 (L) >60 mL/min   Anion gap 11 5 - 15  Protime-INR     Status: Abnormal   Collection Time: 05/17/15 11:40 AM  Result  Value Ref Range   Prothrombin Time 26.7 (H) 11.6 - 15.2 seconds   INR 2.51 (H) 0.00 - 1.49  Brain natriuretic peptide     Status: Abnormal   Collection Time: 05/17/15 11:40 AM  Result Value Ref Range   B Natriuretic Peptide 129.9 (H) 0.0 - 100.0 pg/mL  Troponin I     Status: None   Collection Time: 05/17/15 11:40 AM  Result Value Ref Range   Troponin I 0.03 <0.031 ng/mL  Urinalysis, Routine w reflex microscopic (not at [facility])     Status: Abnormal   Collection Time: 05/17/15 12:21 PM  Result Value Ref Range   Color, Urine YELLOW YELLOW   APPearance CLEAR CLEAR   Specific Gravity, Urine 1.008 1.005 - 1.030   pH 6.5 5.0 - 8.0  Glucose, UA NEGATIVE NEGATIVE mg/dL   Hgb urine dipstick NEGATIVE NEGATIVE   Bilirubin Urine NEGATIVE NEGATIVE   Ketones, ur NEGATIVE NEGATIVE mg/dL   Protein, ur NEGATIVE NEGATIVE mg/dL   Nitrite NEGATIVE NEGATIVE   Leukocytes, UA TRACE (A) NEGATIVE  Urine microscopic-add on     Status: Abnormal   Collection Time: 05/17/15 12:21 PM  Result Value Ref Range   Squamous Epithelial / LPF 0-5 (A) NONE SEEN   WBC, UA 0-5 0 - 5 WBC/hpf   RBC / HPF NONE SEEN 0 - 5 RBC/hpf   Bacteria, UA FEW (A) NONE SEEN  TSH     Status: None   Collection Time: 05/17/15  6:23 PM  Result Value Ref Range   TSH 2.942 0.350 - 4.500 uIU/mL  Troponin I     Status: Abnormal   Collection Time: 05/17/15  6:23 PM  Result Value Ref Range   Troponin I 0.26 (H) <0.031 ng/mL  Hemoglobin A1c     Status: Abnormal   Collection Time: 05/17/15  6:23 PM  Result Value Ref Range   Hgb A1c MFr Bld 6.0 (H) 4.8 - 5.6 %   Mean Plasma Glucose 126 mg/dL  Troponin I     Status: Abnormal   Collection Time: 05/17/15 11:50 PM  Result Value Ref Range   Troponin I 0.40 (H) <0.031 ng/mL  Comprehensive metabolic panel     Status: Abnormal   Collection Time: 05/18/15  5:34 AM  Result Value Ref Range   Sodium 142 135 - 145 mmol/L   Potassium 3.8 3.5 - 5.1 mmol/L   Chloride 104 101 - 111 mmol/L   CO2 28  22 - 32 mmol/L   Glucose, Bld 106 (H) 65 - 99 mg/dL   BUN 28 (H) 6 - 20 mg/dL   Creatinine, Ser 1.36 (H) 0.44 - 1.00 mg/dL   Calcium 8.9 8.9 - 10.3 mg/dL   Total Protein 6.3 (L) 6.5 - 8.1 g/dL   Albumin 3.2 (L) 3.5 - 5.0 g/dL   AST 31 15 - 41 U/L   ALT 20 14 - 54 U/L   Alkaline Phosphatase 86 38 - 126 U/L   Total Bilirubin 0.9 0.3 - 1.2 mg/dL   GFR calc non Af Amer 35 (L) >60 mL/min   GFR calc Af Amer 41 (L) >60 mL/min   Anion gap 10 5 - 15  Troponin I     Status: Abnormal   Collection Time: 05/18/15  5:34 AM  Result Value Ref Range   Troponin I 0.37 (H) <0.031 ng/mL  Protime-INR     Status: Abnormal   Collection Time: 05/18/15  5:34 AM  Result Value Ref Range   Prothrombin Time 26.2 (H) 11.6 - 15.2 seconds   INR 2.44 (H) 0.00 - 1.49    Intake/Output Summary (Last 24 hours) at 05/18/15 0845 Last data filed at 05/17/15 1418  Gross per 24 hour  Intake      0 ml  Output    400 ml  Net   -400 ml    EKG:   Atrial fib rate 88 LBBB.  05/18/2015  ASSESSMENT AND PLAN:  ATRIAL FIB:    Anticoagulation as below.    She is on a higher dose of Cardizem.    CHEST PAIN:    NSTEMI.  Needs cath on Monday.  I will need to reverse the warfarin.  Start heparin when INR is less than 2.    The patient understands that risks included but are  not limited to stroke (1 in 1000), death (1 in 41), kidney failure [usually temporary] (1 in 500), bleeding (1 in 200), allergic reaction [possibly serious] (1 in 200).  The patient understands and agrees to proceed.   We did increase the Imdur.      Jeneen Rinks [facility] 05/18/2015 8:45 AM

## 2015-05-18 NOTE — Progress Notes (Signed)
Report received in room via [place] using [employer], reviewed orders, labs, VS, meds and patient's general condition, assumed care of patient

## 2015-05-18 NOTE — Consult Note (Signed)
[place] notified desk that request to have Patty [name] visit can not be completed as [name] is not in today; please put in another consult if pt still would like [name] visit. 8:36 AM

## 2015-05-18 NOTE — Progress Notes (Signed)
ANTICOAGULATION CONSULT NOTE - Follow Up Consult  Pharmacy Consult for Heparin Indication: atrial fibrillation  Allergies  Allergen Reactions  . Bee Venom Anaphylaxis  . Other Nausea And Vomiting    Pain medications cause severe vomiting. Tolerated slow IV morphine drip    Patient Measurements: Height: 5' (152.4 cm) Weight: 175 lb 14.4 oz (79.788 kg) IBW/kg (Calculated) : 45.5 Heparin Dosing Weight: 64 kg  Vital Signs: Temp: 98.4 F (36.9 C) (04/14 0407) Temp Source: Oral (04/14 0407)  Labs:  Recent Labs  05/17/15 1140 05/17/15 1823 05/17/15 2350 05/18/15 0534  HGB 13.4  --   --   --   HCT 39.9  --   --   --   PLT 231  --   --   --   LABPROT 26.7*  --   --  26.2*  INR 2.51*  --   --  2.44*  CREATININE 1.36*  --   --  1.36*  TROPONINI 0.03 0.26* 0.40* 0.37*    Estimated Creatinine Clearance: 29.8 mL/min (by C-G formula based on Cr of 1.36).   Medications:  Scheduled:  . atorvastatin  80 mg Oral q morning - 10a  . calcium-vitamin D  1 tablet Oral q morning - 10a  . clopidogrel  75 mg Oral Daily  . furosemide  40 mg Oral BID  . isosorbide mononitrate  120 mg Oral Daily  . lisinopril  2.5 mg Oral BID  . metoprolol  100 mg Oral BID  . multivitamin  2 tablet Oral Daily  . multivitamin with minerals  1 tablet Oral Daily  . potassium chloride SA  20 mEq Oral Daily  . sodium chloride flush  3 mL Intravenous Q12H  . spironolactone  25 mg Oral Daily   Infusions:  . diltiazem (CARDIZEM) infusion Stopped (05/17/15 1837)    Assessment: 82 yo F on Coumadin PTA.  To start heparin when/if INR <2 in the event that cardiac cath desired.  Cardiology adjusting meds at this time - may manage medically.  Goal of Therapy:  Heparin level 0.3-0.7 units/ml Monitor platelets by anticoagulation protocol: Yes   Plan:  No heparin for now. INR in AM.  Horton Chin, Pharm.D., BCPS Clinical Pharmacist Pager [phone] 05/18/2015 8:59 AM

## 2015-05-19 ENCOUNTER — Other Ambulatory Visit: Payer: Self-pay | Admitting: Cardiology

## 2015-05-19 DIAGNOSIS — I214 Non-ST elevation (NSTEMI) myocardial infarction: Secondary | ICD-10-CM

## 2015-05-19 DIAGNOSIS — Z7901 Long term (current) use of anticoagulants: Secondary | ICD-10-CM

## 2015-05-19 LAB — PROTIME-INR
INR: 1.64 — ABNORMAL HIGH (ref 0.00–1.49)
Prothrombin Time: 19.5 seconds — ABNORMAL HIGH (ref 11.6–15.2)

## 2015-05-19 LAB — HEPARIN LEVEL (UNFRACTIONATED): HEPARIN UNFRACTIONATED: 0.26 [IU]/mL — AB (ref 0.30–0.70)

## 2015-05-19 MED ORDER — DILTIAZEM HCL ER COATED BEADS 240 MG PO CP24: 240.0000 mg | ORAL_CAPSULE | Freq: Every day | ORAL | Status: DC

## 2015-05-19 MED ORDER — SPIRONOLACTONE 25 MG PO TABS: 25.0000 mg | ORAL_TABLET | Freq: Every day | ORAL | Status: DC

## 2015-05-19 MED ORDER — ISOSORBIDE MONONITRATE ER 120 MG PO TB24: 120.0000 mg | ORAL_TABLET | Freq: Every day | ORAL | Status: DC

## 2015-05-19 MED ORDER — HEPARIN (PORCINE) IN NACL 100-0.45 UNIT/ML-% IJ SOLN
900.0000 [IU]/h | INTRAMUSCULAR | Status: DC
  Administered 2015-05-19: 750 [IU]/h via INTRAVENOUS
  Filled 2015-05-19: qty 250

## 2015-05-19 NOTE — Progress Notes (Signed)
Patient went for a walk in the hallway and did not have any chest pain while ambulating. HR was in 50-60's and BP was 97/49 afterwards. Patient tolerated well.

## 2015-05-19 NOTE — Progress Notes (Signed)
ANTICOAGULATION CONSULT NOTE - Follow Up Consult  Pharmacy Consult for Heparin (when INR <2) Indication: atrial fibrillation  Allergies  Allergen Reactions  . Bee Venom Anaphylaxis  . Other Nausea And Vomiting    Pain medications cause severe vomiting. Tolerated slow IV morphine drip    Patient Measurements: Height: 5' (152.4 cm) Weight: 175 lb 14.4 oz (79.788 kg) IBW/kg (Calculated) : 45.5  Vital Signs: Temp: 98.1 F (36.7 C) (04/14 1938) Temp Source: Oral (04/14 1938) BP: 94/50 mmHg (04/14 2204) Pulse Rate: 60 (04/14 2201)  Labs:  Recent Labs  05/17/15 1140 05/17/15 1823 05/17/15 2350 05/18/15 0534 05/19/15 0436  HGB 13.4  --   --   --   --   HCT 39.9  --   --   --   --   PLT 231  --   --   --   --   LABPROT 26.7*  --   --  26.2* 19.5*  INR 2.51*  --   --  2.44* 1.64*  CREATININE 1.36*  --   --  1.36*  --   TROPONINI 0.03 0.26* 0.40* 0.37*  --     Estimated Creatinine Clearance: 29.8 mL/min (by C-G formula based on Cr of 1.36).   Assessment: Starting heparin now that INR is <2 in case pt needs any procedures/cardiac cath, INR is 1.64 this AM  Goal of Therapy:  Heparin level 0.3-0.7 units/ml Monitor platelets by anticoagulation protocol: Yes   Plan:  -Start heparin drip at 750 units/hr -1400 HL  Narda Bonds 05/19/2015,5:42 AM

## 2015-05-19 NOTE — Discharge Summary (Signed)
Discharge Summary    Patient ID: Patty [name],  MRN: [id number], DOB/AGE: 80-21-1932 80 y.o.  Admit date: 05/17/2015 Discharge date: 05/19/2015  Primary Care Provider: [name] D Primary Cardiologist: Dr. Irish Lack  Discharge Diagnoses    Principal Problem:   Unstable angina pectoris [facility]) Active Problems:   HTN (hypertension)   Atrial fibrillation with RVR ([name])   Chronic anticoagulation - coumadin, CHADS2VASC=6   Unstable angina ([name])   NSTEMI (non-ST elevated myocardial infarction) ([name])   Allergies Allergies  Allergen Reactions  . Bee Venom Anaphylaxis  . Other Nausea And Vomiting    Pain medications cause severe vomiting. Tolerated slow IV morphine drip    Diagnostic Studies/Procedures  None _____________   History of Present Illness   Patty [name] is a 82 year old female with a past medical history of afib on coumadin, [employer] leadless PPM for junctional rhythm, CAD stents x 2 LAD & x 1 CFX 2000, rotational atherectomy to pCFX/pLAD & DES LAD 08/2012 chronic OM1 100%, DES CFX 11/2012, DES LAD for ISR 10/2013, DES oLAD [id number], diastolic CHF and HTN.   Seen in office 04/17/2015, no sig angina, afib rate controlled, coumadin therapeutic. On 05/17/15 at approximately 7:30 AM, she was wakened from sleep by chest pain, her usual angina. She took nitroglycerin with improvement but had to keep taking them because the symptoms did not resolve. She was a little short of breath with this. She took her usual dose of Lasix and her clopidogrel, and got nauseated so did not take any additional medications. She did not vomit. She did not have any diaphoresis. When the third dose of nitroglycerin did not relieve her pain, she felt she needed to be seen and came to the emergency room.     Hospital Course  In the emergency room, she was in rapid atrial flutter with a left bundle branch block. She was started on IV Cardizem. She became bradycardic with rates in 50's, so IV  Cardizem was stopped, she was put on po diltiazem. Her troponin was mildly elevated at 0.26-0.40-0.37. She converted to NSR and no additional chest pain.  It was felt that her elevation in troponin was in the setting of her rapid Afib.    She ambulated in hallways without angina and SOB. She will need outpatient Lexiscan Myoview.  Her coumadin was revered for possible cath, however we will start her back on her usual dose of coumadin and have her follow up with our [address] office for an INR check on Tuesday.   _____________  Discharge Vitals Blood pressure 110/70, pulse 65, temperature 98 F (36.7 C), temperature source Oral, resp. rate 19, height 5' (1.524 m), weight 177 lb 14.6 oz (80.7 kg), SpO2 97 %.  Filed Weights   05/17/15 1730 05/18/15 0218 05/19/15 0510  Weight: 176 lb 1.6 oz (79.878 kg) 175 lb 14.4 oz (79.788 kg) 177 lb 14.6 oz (80.7 kg)    Labs & Radiologic Studies     CBC  Recent Labs  05/17/15 1140  WBC 6.6  NEUTROABS 4.8  HGB 13.4  HCT 39.9  MCV 93.7  PLT [id number]   Basic Metabolic Panel  Recent Labs  05/17/15 1140 05/18/15 0534  NA 139 142  K 4.7 3.8  CL 101 104  CO2 27 28  GLUCOSE 115* 106*  BUN 32* 28*  CREATININE 1.36* 1.36*  CALCIUM 8.8* 8.9   Liver Function Tests  Recent Labs  05/18/15 0534  AST 31  ALT  20  ALKPHOS 86  BILITOT 0.9  PROT 6.3*  ALBUMIN 3.2*   Cardiac Enzymes  Recent Labs  05/17/15 1823 05/17/15 2350 05/18/15 0534  TROPONINI 0.26* 0.40* 0.37*   Hemoglobin A1C  Recent Labs  05/17/15 1823  HGBA1C 6.0*   Thyroid Function Tests  Recent Labs  05/17/15 1823  TSH 2.942    Dg Chest Portable 1 View  05/17/2015  CLINICAL DATA:  Chest pain EXAM: PORTABLE CHEST 1 VIEW COMPARISON:  05/14/2014 FINDINGS: Mild cardiac enlargement without heart failure. Lungs are clear without infiltrate or effusion. Negative for mass lesion. IMPRESSION: No active disease. Electronically Signed   By: Franchot Gallo M.D.   On:  05/17/2015 12:01    Disposition   Pt is being discharged home today in good condition.  Follow-up Plans & Appointments     Discharge Instructions    Diet - low sodium heart healthy    Complete by:  As directed      Increase activity slowly    Complete by:  As directed            Discharge Medications   Current Discharge Medication List    CONTINUE these medications which have CHANGED   Details  diltiazem (CARDIZEM CD) 240 MG 24 hr capsule Take 1 capsule (240 mg total) by mouth daily. Qty: 30 capsule, Refills: 6    isosorbide mononitrate (IMDUR) 120 MG 24 hr tablet Take 1 tablet (120 mg total) by mouth daily. Qty: 30 tablet, Refills: 6    !! spironolactone (ALDACTONE) 25 MG tablet Take 1 tablet (25 mg total) by mouth daily. Qty: 30 tablet, Refills: 6     !! - Potential duplicate medications found. Please discuss with provider.    CONTINUE these medications which have NOT CHANGED   Details  aspirin EC 81 MG tablet Take 324 mg by mouth once.    atorvastatin (LIPITOR) 80 MG tablet TAKE 1 TABLET BY MOUTH EVERY MORNING Qty: 30 tablet, Refills: 3    beta carotene w/minerals (OCUVITE) tablet Take 2 tablets by mouth daily.    Calcium Carb-Cholecalciferol (CALTRATE 600+D) 600-800 MG-UNIT TABS Take 1 tablet by mouth every morning.     clopidogrel (PLAVIX) 75 MG tablet TAKE 1 TABLET BY MOUTH EVERY DAY WITH BREAKFAST Qty: 30 tablet, Refills: 11    Coenzyme Q-10 100 MG capsule Take 200 mg by mouth at bedtime.     furosemide (LASIX) 40 MG tablet Take 1 tablet (40 mg total) by mouth 2 (two) times daily. Qty: 180 tablet, Refills: 3    lisinopril (PRINIVIL,ZESTRIL) 5 MG tablet TAKE 1 TABLET BY MOUTH 2 TIMES DAILY Qty: 60 tablet, Refills: 3    metoprolol (LOPRESSOR) 100 MG tablet Take 1 tablet (100 mg total) by mouth 2 (two) times daily. Qty: 60 tablet, Refills: 11    Multiple Vitamin (MULTIVITAMIN) capsule Take 1 capsule by mouth daily.    NITROSTAT 0.4 MG SL tablet  PLACE 1 TABLET (0.4 MG TOTAL) UNDER THE TONGUE EVERY 5 (FIVE) MINUTES AS NEEDED. FOR CHEST PAIN Qty: 25 tablet, Refills: 2    Omega-3 Fatty Acids (FISH OIL PO) Take 1,200 mg by mouth daily.     potassium chloride SA (K-DUR,KLOR-CON) 20 MEQ tablet Take 1 tablet (20 mEq total) by mouth daily. Qty: 30 tablet, Refills: 6    !! spironolactone (ALDACTONE) 25 MG tablet 1 tablet daily.May take additional 1/2 tablet daily as needed for edema. If you have to take 2 to 3 times a week contact Dr  Fox.Otho Darner: 45 tablet, Refills: 11    warfarin (COUMADIN) 2 MG tablet Take 1-2 mg by mouth daily. 1mg  on Tuesdays and Fridays. 2mg  all other days.     !! - Potential duplicate medications found. Please discuss with provider.       Outstanding Labs/Studies  INR check on Tues 05/22/15.  Follow up appointment in a week, before TCM appt. Need Myoview.   Duration of Discharge Encounter   Greater than 30 minutes including physician time.  Signed, Arbutus Leas NP 05/19/2015, 4:02 PM

## 2015-05-19 NOTE — Progress Notes (Signed)
Patient Name: Patty [name] Date of Encounter: 05/19/2015  Principal Problem:   Unstable angina pectoris ([place]) Active Problems:   HTN (hypertension)   Atrial fibrillation with RVR ([name])   Chronic anticoagulation - coumadin, CHADS2VASC=6   Unstable angina ([place])   NSTEMI (non-ST elevated myocardial infarction) ([place])   Length of Stay: 2  SUBJECTIVE  Angina has resolved completely, but she does feel tired. Denies dyspnea Converted to normal rhythm around 1 AM. Intermittent periods of ventricular pacing for rates less than 50 bpm. Blood pressure was a little low on intravenous Cardizem, now normal.  CURRENT MEDS . aspirin  81 mg Oral Pre-Cath  . atorvastatin  80 mg Oral q morning - 10a  . calcium-vitamin D  1 tablet Oral q morning - 10a  . clopidogrel  75 mg Oral Daily  . diltiazem  240 mg Oral Daily  . furosemide  40 mg Oral BID  . isosorbide mononitrate  120 mg Oral Daily  . lisinopril  2.5 mg Oral BID  . metoprolol  100 mg Oral BID  . multivitamin  2 tablet Oral Daily  . multivitamin with minerals  1 tablet Oral Daily  . potassium chloride SA  20 mEq Oral Daily  . sodium chloride flush  3 mL Intravenous Q12H  . sodium chloride flush  3 mL Intravenous Q12H  . spironolactone  25 mg Oral Daily    OBJECTIVE   Intake/Output Summary (Last 24 hours) at 05/19/15 1114 Last data filed at 05/19/15 0833  Gross per 24 hour  Intake    840 ml  Output    200 ml  Net    640 ml   Filed Weights   05/17/15 1730 05/18/15 0218 05/19/15 0510  Weight: 79.878 kg (176 lb 1.6 oz) 79.788 kg (175 lb 14.4 oz) 80.7 kg (177 lb 14.6 oz)    PHYSICAL EXAM Filed Vitals:   05/19/15 0510 05/19/15 0700 05/19/15 0800 05/19/15 0832  BP: 115/61 112/53 125/49 107/70  Pulse: 63     Temp: 97.7 F (36.5 C)     TempSrc: Oral     Resp: 18 21 18 22   Height:      Weight: 80.7 kg (177 lb 14.6 oz)     SpO2: 97%      General: Alert, oriented x3, no distress Head: no evidence of trauma, PERRL, EOMI, no  exophtalmos or lid lag, no myxedema, no xanthelasma; normal ears, nose and oropharynx Neck: normal jugular venous pulsations and no hepatojugular reflux; brisk carotid pulses without delay and no carotid bruits Chest: clear to auscultation, no signs of consolidation by percussion or palpation, normal fremitus, symmetrical and full respiratory excursions Cardiovascular: normal position and quality of the apical impulse, regular rhythm, normal first and second heart sounds, no rubs or gallops, no murmur Abdomen: no tenderness or distention, no masses by palpation, no abnormal pulsatility or arterial bruits, normal bowel sounds, no hepatosplenomegaly Extremities: no clubbing, cyanosis or edema; 2+ radial, ulnar and brachial pulses bilaterally; 2+ right femoral, posterior tibial and dorsalis pedis pulses; 2+ left femoral, posterior tibial and dorsalis pedis pulses; no subclavian or femoral bruits Neurological: grossly nonfocal  LABS  CBC  Recent Labs  05/17/15 1140  WBC 6.6  NEUTROABS 4.8  HGB 13.4  HCT 39.9  MCV 93.7  PLT [id number]   Basic Metabolic Panel  Recent Labs  05/17/15 1140 05/18/15 0534  NA 139 142  K 4.7 3.8  CL 101 104  CO2 27 28  GLUCOSE 115* 106*  BUN 32* 28*  CREATININE 1.36* 1.36*  CALCIUM 8.8* 8.9   Liver Function Tests  Recent Labs  05/18/15 0534  AST 31  ALT 20  ALKPHOS 86  BILITOT 0.9  PROT 6.3*  ALBUMIN 3.2*   No results for input(s): LIPASE, AMYLASE in the last 72 hours. Cardiac Enzymes  Recent Labs  05/17/15 1823 05/17/15 2350 05/18/15 0534  TROPONINI 0.26* 0.40* 0.37*   BNP Invalid input(s): POCBNP D-Dimer No results for input(s): DDIMER in the last 72 hours. Hemoglobin A1C  Recent Labs  05/17/15 1823  HGBA1C 6.0*   Fasting Lipid Panel No results for input(s): CHOL, HDL, LDLCALC, TRIG, CHOLHDL, LDLDIRECT in the last 72 hours. Thyroid Function Tests  Recent Labs  05/17/15 1823  TSH 2.942    Radiology Studies Imaging  results have been reviewed and Dg Chest Portable 1 View  05/17/2015  CLINICAL DATA:  Chest pain EXAM: PORTABLE CHEST 1 VIEW COMPARISON:  05/14/2014 FINDINGS: Mild cardiac enlargement without heart failure. Lungs are clear without infiltrate or effusion. Negative for mass lesion. IMPRESSION: No active disease. Electronically Signed   By: Franchot Gallo M.D.   On: 05/17/2015 12:01    TELE Converted to normal sinus rhythm at 1 AM, intermittent dizzy pacing at 50 bpm    ASSESSMENT AND PLAN  1. Unstable angina/very small NSTEMI - resolved with resolution of arrhythmia and was likely related to the atrial fibrillation and increased myocardial demand. Will have her walk the hallways. If she can do so without symptoms, I think we can avoid coronary angiography. Reasonable to pursue outpatient Lexiscan Myoview since she is still close to the time window for possible in-stent restenosis. CHADS2VASC=6. She received vitamin K yesterday and it may take a while for warfarin to "kick back in". She has never had a stroke/TIA or or embolic event to my knowledge. However, if she develops angina with ambulation will keep in-house for cardiac catheterization on Monday  2. Paroxysmal atrial fibrillation - resolved. Now on higher doses of rate control meds. Resume warfarin if no angina today. Discussed options for antiarrhythmic medications and radiofrequency ablation, with her accompanying limitations and drawbacks. For now stick with rate control strategy, but would recommend a follow-up appointment with Dr. Thompson Grayer to discuss treatment options.  3. Junctional rhythm/ventricular pacemaker - normal device function. Somewhat limited options for treatment of atrial fibrillation without a dual-chamber device   Sanda Klein, MD, [facility] [phone] office [phone] pager 05/19/2015 11:14 AM

## 2015-05-19 NOTE — Progress Notes (Signed)
ANTICOAGULATION CONSULT NOTE - Follow Up Consult  Pharmacy Consult for Heparin (when INR <2) Indication: atrial fibrillation  Allergies  Allergen Reactions  . Bee Venom Anaphylaxis  . Other Nausea And Vomiting    Pain medications cause severe vomiting. Tolerated slow IV morphine drip    Patient Measurements: Height: 5' (152.4 cm) Weight: 177 lb 14.6 oz (80.7 kg) IBW/kg (Calculated) : 45.5  Vital Signs: Temp: 98 F (36.7 C) (04/15 1421) Temp Source: Oral (04/15 1421) BP: 110/70 mmHg (04/15 1421) Pulse Rate: 65 (04/15 1421)  Labs:  Recent Labs  05/17/15 1140 05/17/15 1823 05/17/15 2350 05/18/15 0534 05/19/15 0436 05/19/15 1412  HGB 13.4  --   --   --   --   --   HCT 39.9  --   --   --   --   --   PLT 231  --   --   --   --   --   LABPROT 26.7*  --   --  26.2* 19.5*  --   INR 2.51*  --   --  2.44* 1.64*  --   HEPARINUNFRC  --   --   --   --   --  0.26*  CREATININE 1.36*  --   --  1.36*  --   --   TROPONINI 0.03 0.26* 0.40* 0.37*  --   --     Estimated Creatinine Clearance: 30 mL/min (by C-G formula based on Cr of 1.36).   Assessment: INR 1.64 this morning and heparin restarted in case pt needs any procedures/cardiac cath. HL subtherapeutic at 0.26. Last CBC 4/13: Hgb 13.4, plt 231. No overt bleeding reported.     Goal of Therapy:  Heparin level 0.3-0.7 units/ml Monitor platelets by anticoagulation protocol: Yes   Plan:  Increase heparin to 900 units/hr 8hr HL Daily INR, CBC, HL Monitor s/sx of bleeding  Stephens November, PharmD Clinical Pharmacy Resident 3:23 PM, 05/19/2015

## 2015-05-21 ENCOUNTER — Telehealth: Payer: Self-pay | Admitting: Interventional Cardiology

## 2015-05-21 SURGERY — LEFT HEART CATH AND CORONARY ANGIOGRAPHY
Anesthesia: LOCAL

## 2015-05-21 NOTE — Telephone Encounter (Signed)
New Message  TCM   05/28/2015 @ 9a  With Bonney Leitz.

## 2015-05-21 NOTE — Telephone Encounter (Signed)
Pt was admitted from 4/13 to 4/15 and a follow up Coumadin appt was scheduled for 05/22/15.

## 2015-05-21 NOTE — Telephone Encounter (Signed)
Patient contacted regarding discharge from [facility] on 05/19/2015.  Patient understands to follow up with provider Angelena Form PA-C on 05/28/2015 at 9:00 AM at [facility] at 10:00, INR appointment on 05/22/15 at 1:45 Patient understands discharge instructions? yes Patient understands medications and regiment? yes Patient understands to bring all medications to this visit? yes  The pt wanted to make sure that her Spironolactone is correctly documented in her chart.  She takes 1/2 tablet daily and then uses PRN per Dr Hassell Done instructions.  This medication was not changed during hospitalization. I have updated medication list.

## 2015-05-22 ENCOUNTER — Ambulatory Visit (INDEPENDENT_AMBULATORY_CARE_PROVIDER_SITE_OTHER): Payer: Medicare Other | Admitting: *Deleted

## 2015-05-22 ENCOUNTER — Other Ambulatory Visit: Payer: Self-pay | Admitting: Interventional Cardiology

## 2015-05-22 DIAGNOSIS — Z5181 Encounter for therapeutic drug level monitoring: Secondary | ICD-10-CM | POA: Diagnosis not present

## 2015-05-22 DIAGNOSIS — I4891 Unspecified atrial fibrillation: Secondary | ICD-10-CM | POA: Diagnosis not present

## 2015-05-22 LAB — POCT INR: INR: 1.6

## 2015-05-23 ENCOUNTER — Telehealth (HOSPITAL_COMMUNITY): Payer: Self-pay | Admitting: *Deleted

## 2015-05-23 NOTE — Telephone Encounter (Signed)
Patient given detailed instructions per Myocardial Perfusion Study Information Sheet for the test on 05/28/15 Patient notified to arrive 15 minutes early and that it is imperative to arrive on time for appointment to keep from having the test rescheduled.  If you need to cancel or reschedule your appointment, please call the office within 24 hours of your appointment. Failure to do so may result in a cancellation of your appointment, and a $50 no show fee. Patient verbalized understanding.Hubbard Robinson, RN

## 2015-05-24 NOTE — Progress Notes (Signed)
Entered in error ROS     This encounter was created in error - please disregard.

## 2015-05-25 ENCOUNTER — Telehealth: Payer: Self-pay | Admitting: Interventional Cardiology

## 2015-05-25 NOTE — Telephone Encounter (Signed)
New Message:  Pt is calling in to speak with a nurse about the medication changes that took place while she was in the hospital. Please f/u with her

## 2015-05-25 NOTE — Telephone Encounter (Signed)
The pt was d/c'd from the hospital on 4/15. She states that several of her medications were adjusted which included her Diltiazem which was changed from 180 mg daily to 240 mg daily. She states that since then she has been light headed, her face is flushed and she states that her Systolic BP has been running in the low 90's. She is concerned because she is due to go out of town on Tuesday through Thursday next week and wants to know what she should do.  She is advised to decrease her Diltiazem back to 180 mg daily. She verbalized understanding and she will be in the office on Monday for a Coumadin apt and states that if she continues to have s/s she will let us know then.  Will forward to Dr Irish Lack as Juluis Rainier.

## 2015-05-28 ENCOUNTER — Ambulatory Visit (INDEPENDENT_AMBULATORY_CARE_PROVIDER_SITE_OTHER): Payer: Medicare Other | Admitting: *Deleted

## 2015-05-28 ENCOUNTER — Encounter: Payer: Medicare Other | Admitting: Physician Assistant

## 2015-05-28 ENCOUNTER — Ambulatory Visit (HOSPITAL_COMMUNITY): Payer: Medicare Other | Attending: Cardiovascular Disease

## 2015-05-28 DIAGNOSIS — I447 Left bundle-branch block, unspecified: Secondary | ICD-10-CM | POA: Diagnosis not present

## 2015-05-28 DIAGNOSIS — R079 Chest pain, unspecified: Secondary | ICD-10-CM | POA: Diagnosis not present

## 2015-05-28 DIAGNOSIS — R0609 Other forms of dyspnea: Secondary | ICD-10-CM | POA: Insufficient documentation

## 2015-05-28 DIAGNOSIS — I214 Non-ST elevation (NSTEMI) myocardial infarction: Secondary | ICD-10-CM | POA: Diagnosis not present

## 2015-05-28 DIAGNOSIS — R002 Palpitations: Secondary | ICD-10-CM | POA: Diagnosis not present

## 2015-05-28 DIAGNOSIS — R9439 Abnormal result of other cardiovascular function study: Secondary | ICD-10-CM | POA: Insufficient documentation

## 2015-05-28 DIAGNOSIS — I4891 Unspecified atrial fibrillation: Secondary | ICD-10-CM | POA: Diagnosis not present

## 2015-05-28 DIAGNOSIS — I10 Essential (primary) hypertension: Secondary | ICD-10-CM | POA: Diagnosis not present

## 2015-05-28 DIAGNOSIS — Z5181 Encounter for therapeutic drug level monitoring: Secondary | ICD-10-CM | POA: Diagnosis not present

## 2015-05-28 LAB — MYOCARDIAL PERFUSION IMAGING
CHL CUP NUCLEAR SRS: 5
CHL CUP NUCLEAR SSS: 8
CSEPPHR: 103 {beats}/min
LHR: 0.3
NUC STRESS TID: 1.06
Rest HR: 85 {beats}/min
SDS: 3

## 2015-05-28 LAB — POCT INR: INR: 2.7

## 2015-05-28 MED ORDER — REGADENOSON 0.4 MG/5ML IV SOLN
0.4000 mg | Freq: Once | INTRAVENOUS | Status: AC
  Administered 2015-05-28: 0.4 mg via INTRAVENOUS

## 2015-05-28 MED ORDER — TECHNETIUM TC 99M SESTAMIBI GENERIC - CARDIOLITE
30.7000 | Freq: Once | INTRAVENOUS | Status: AC | PRN
  Administered 2015-05-28: 30.7 via INTRAVENOUS

## 2015-05-28 MED ORDER — TECHNETIUM TC 99M SESTAMIBI GENERIC - CARDIOLITE
10.8000 | Freq: Once | INTRAVENOUS | Status: AC | PRN
  Administered 2015-05-28: 11 via INTRAVENOUS

## 2015-06-01 ENCOUNTER — Encounter: Payer: Medicare Other | Admitting: Physician Assistant

## 2015-06-10 NOTE — Progress Notes (Signed)
Cardiology Office Note   Date:  06/11/2015   ID:  Patty [name], DOB 1930-09-18, MRN [id number]  PCP:  Kandice Hams, MD  Cardiologist:  Dr. Irish Lack  Chief Complaint  Patient presents with  . Hospitalization Follow-up    continued episodes of chest discomfort assoicated with flutterings.      History of Present Illness: Patty [name] is a 81 y.o. female who presents for post hospitalization.   She has a past medical history of afib on coumadin, St Jude leadless PPM for junctional rhythm, CAD stents x 2 LAD & x 1 CFX 2000, rotational atherectomy to pCFX/pLAD & DES LAD 08/2012 chronic OM1 100%, DES CFX 11/2012, DES LAD for ISR 10/2013, DES oLAD [id number], diastolic CHF and HTN.   Seen in office 04/17/2015, no sig angina, afib rate controlled, coumadin therapeutic. On 05/17/15 at approximately 7:30 AM, she was wakened from sleep by chest pain, her usual angina. She took nitroglycerin with improvement but had to keep taking them because the symptoms did not resolve. She was a little short of breath with this. She took her usual dose of Lasix and her clopidogrel, and got nauseated so did not take any additional medications. She did not vomit. She did not have any diaphoresis. When the third dose of nitroglycerin did not relieve her pain, she felt she needed to be seen and came to the emergency room.   In the emergency room, she was in rapid atrial flutter with a left bundle branch block. She was started on IV Cardizem. She became bradycardic with rates in 50's, so IV Cardizem was stopped, she was put on po diltiazem. Her troponin was mildly elevated at 0.26-0.40-0.37. She converted to NSR and no additional chest pain. It was felt that her elevation in troponin was in the setting of her rapid Afib.   She ambulated in hallways without angina and SOB. She had outpatient Lexiscan Myoview. low risk nuc study --she was noted to be in a flutter on nuc study.    .    Today she notes she has  2 episodes of chest pain/discomfort and associated fluttering in her chest since discharge each resolved with 1 SL NTG.  Other days she has had fluttering feeling.  Her HR has been more elevated especially the last few days.  When she left hospital she was in [place] with pacing.  EKG  Today regular possible atypical a flutter or atrial tach.     Past Medical History  Diagnosis Date  . Hypertension   . Hyperlipidemia   . Obesity   . Diverticulitis   . Peripheral vascular disease ([place])   . Varicose veins   . Granulosa cell carcinoma ([place])     abd; last episode was in 2009  . Tachycardia-bradycardia syndrome ([place])     a. s/p leadless pacemaker ([name]) implanted by Dr [name]  . Permanent atrial fibrillation ([place]) 2013  . Combined systolic and diastolic heart failure, NYHA class 3 ([place])   . Coronary artery disease     a. s/p multiple stents  b. LHC 12/14/13 with sig CAD with TO of OM1, 70-80% occusion OM2, 50-70% mLCx, 50-70% pLAD (recent FFR 0.83), diffusely diseased RCA, widely patent stetns in pLAD and LCx.  Marland Kitchen LBBB (left bundle branch block)        . Myocardial infarction ([place]) 2002  . Renal artery stenosis ([place])   . Pacemaker - St Jude Leadless PPM   . Chronic anticoagulation - coumadin, CHADS2VASC=6 05/17/2015  .  OSA on CPAP     Past Surgical History  Procedure Laterality Date  . Cholecystectomy  1980's  . Salivary gland surgery  2000's    "had a little lump removed; granulosa related; it was benign" (08/11/2012)  . Abdominal hysterectomy    . Colon surgery  2004    colectomy for diverticulosis  . Granulosa tumor excision  2000; 2003; 2004; 2007    "all in my abdomen including small intestines, outside my ?uterus/etc" (08/11/2012)  . Tee without cardioversion  12/31/2011    Procedure: TRANSESOPHAGEAL ECHOCARDIOGRAM (TEE);  Surgeon: [name], MD;  Location: [place];  Service: Cardiovascular;  Laterality: N/A;  . Cardioversion  12/31/2011    Procedure: CARDIOVERSION;   Surgeon: [name], MD;  Location: [place];  Service: Cardiovascular;  Laterality: N/A;  . Cardiac catheterization  09/03/2007    EF 70%; Failed attempt at PCI to OM  . Cardiac catheterization  11/01/2003    EF 70%  . Coronary angioplasty with stent placement  2000; 08/11/2012; 11/12/2012    3 + 2 LAD & CFX; 2nd CFX stent 11/12/2012  . Varicose vein surgery Bilateral 1977  . Hernia repair  2005    "laparoscopic"  . Cataract extraction, bilateral  2015  . Cardioversion N/A 12/31/2011    Procedure: CARDIOVERSION;  Surgeon: [name], MD;  Location[facility] C[facility] LAB;  Service: Cardiovascular;  Laterality: N/A;  . Permanent pacemaker insertion N/A 03/16/2012    [name] (SJM) leadless pacemaker (LEADLESS II STUDY PATEINT)  . Percutaneous coronary intervention-balloon only  08/04/2012    Procedure: PERCUTANEOUS CORONARY INTERVENTION-BALLOON ONLY;  Surgeon: [name], MD;  Location: [facility] CATH LAB;  Service: Cardiovascular;;  . Percutaneous coronary rotoblator intervention (pci-r) N/A 08/11/2012    Procedure: PERCUTANEOUS CORONARY ROTOBLATOR INTERVENTION (PCI-R);  Surgeon: [name], MD;  Location: [facility] CATH LAB;  Service: Cardiovascular;  Laterality: N/A;  . Left heart catheterization with coronary angiogram N/A 11/12/2012    Procedure: LEFT HEART CATHETERIZATION WITH CORONARY ANGIOGRAM;  Surgeon: [name], MD;  Location: [facility] CATH LAB;  Service: Cardiovascular;  Laterality: N/A;  . Left heart catheterization with coronary angiogram N/A 10/07/2013    Procedure: LEFT HEART CATHETERIZATION WITH CORONARY ANGIOGRAM;  Surgeon: [name], MD;  Location: [facility] CATH LAB;  Service: Cardiovascular;  Laterality: N/A;  . Percutaneous coronary stent intervention (pci-s)  10/07/2013    Procedure: PERCUTANEOUS CORONARY STENT INTERVENTION (PCI-S);  Surgeon: [name], MD;  Location: [facility] CATH LAB;  Service: Cardiovascular;;  . Fractional flow reserve wire  10/07/2013      Procedure: FRACTIONAL FLOW RESERVE WIRE;  Surgeon: [name], MD;  Location: [facility] CATH LAB;  Service: Cardiovascular;;  . Left heart catheterization with coronary angiogram N/A 12/14/2013    Procedure: LEFT HEART CATHETERIZATION WITH CORONARY ANGIOGRAM;  Surgeon: Sinclair Grooms, MD;  Location: [facility] CATH LAB;  Service: Cardiovascular;  Laterality: N/A;  . Left heart catheterization with coronary angiogram N/A 05/16/2014    Procedure: LEFT HEART CATHETERIZATION WITH CORONARY ANGIOGRAM;  Surgeon: Sherren Mocha, MD;  Location: [facility] CATH LAB;  Service: Cardiovascular;  Laterality: N/A;     Current Outpatient Prescriptions  Medication Sig Dispense Refill  . aspirin EC 81 MG tablet Take 324 mg by mouth once.    Marland Kitchen atorvastatin (LIPITOR) 80 MG tablet TAKE 1 TABLET BY MOUTH EVERY MORNING 30 tablet 3  . beta carotene w/minerals (OCUVITE) tablet Take 2 tablets by mouth daily.    . Calcium Carb-Cholecalciferol (  CALTRATE 600+D) 600-800 MG-UNIT TABS Take 1 tablet by mouth every morning.     . clopidogrel (PLAVIX) 75 MG tablet TAKE 1 TABLET BY MOUTH EVERY DAY WITH BREAKFAST 30 tablet 11  . Coenzyme Q-10 100 MG capsule Take 200 mg by mouth at bedtime.     Marland Kitchen diltiazem (CARDIZEM CD) 240 MG 24 hr capsule Take 1 capsule (240 mg total) by mouth daily. 30 capsule 6  . furosemide (LASIX) 40 MG tablet Take 1 tablet (40 mg total) by mouth 2 (two) times daily. 180 tablet 3  . isosorbide mononitrate (IMDUR) 120 MG 24 hr tablet Take 1 tablet (120 mg total) by mouth daily. 30 tablet 6  . lisinopril (PRINIVIL,ZESTRIL) 5 MG tablet TAKE 1 TABLET BY MOUTH 2 TIMES DAILY 60 tablet 3  . metoprolol (LOPRESSOR) 100 MG tablet Take 1 tablet (100 mg total) by mouth 2 (two) times daily. 60 tablet 11  . Multiple Vitamin (MULTIVITAMIN) capsule Take 1 capsule by mouth daily.    Marland Kitchen NITROSTAT 0.4 MG SL tablet PLACE 1 TABLET (0.4 MG TOTAL) UNDER THE TONGUE EVERY 5 (FIVE) MINUTES AS NEEDED. FOR CHEST PAIN 25 tablet 2  . Omega-3  Fatty Acids (FISH OIL PO) Take 1,200 mg by mouth daily.     . potassium chloride SA (K-DUR,KLOR-CON) 20 MEQ tablet Take 1 tablet (20 mEq total) by mouth daily. 30 tablet 6  . spironolactone (ALDACTONE) 25 MG tablet Take 0.5 tablets (12.5 mg total) by mouth daily. Take additional 1/2 tablet daily as needed for edema. If you have to take 2 to 3 times a week contact Dr Irish Lack.. 45 tablet 11  . warfarin (COUMADIN) 2 MG tablet Take 1-2 mg by mouth daily. 1mg  on Tuesdays and Fridays. 2mg  all other days.     No current facility-administered medications for this visit.    Allergies:   Bee venom and Other    Social History:  The patient  reports that she quit smoking about 41 years ago. Her smoking use included Cigarettes. She has a 32 pack-year smoking history. She has never used smokeless tobacco. She reports that she drinks about 3.0 oz of alcohol per week. She reports that she does not use illicit drugs.   Family History:  The patient's family history includes Diabetes in her brother and father; Heart attack in her brother and father; Hypertension in her brother and father; Kidney failure in her brother; Stroke in her mother.    ROS:  General:no colds or fevers, no weight changes Skin:no rashes or ulcers HEENT:no blurred vision, no congestion CV:see HPI PUL:see HPI GI:no diarrhea constipation or melena, no indigestion GU:no hematuria, no dysuria MS:no joint pain, no claudication Neuro:no syncope, no lightheadedness Endo:no diabetes, no thyroid disease  Wt Readings from Last 3 Encounters:  06/11/15 179 lb 6.4 oz (81.375 kg)  05/28/15 177 lb (80.287 kg)  05/19/15 177 lb 14.6 oz (80.7 kg)     PHYSICAL EXAM: VS:  BP 122/58 mmHg  Pulse 103  Ht 5' (1.524 m)  Wt 179 lb 6.4 oz (81.375 kg)  BMI 35.04 kg/m2  SpO2 98% , BMI Body mass index is 35.04 kg/(m^2). General:Pleasant affect, NAD Skin:Warm and dry, brisk capillary refill HEENT:normocephalic, sclera clear, mucus membranes  moist Neck:supple, no JVD, no bruits  Heart:S1S2 RRR and fast without murmur, gallup, rub or click Lungs:clear without rales, rhonchi, or wheezes [id number], non tender, + BS, do not palpate liver spleen or masses Ext:no lower ext edema, 2+ pedal pulses, 2+ radial pulses Neuro:alert  and oriented X 3, MAE, follows commands, + facial symmetry    EKG:  EKG is ordered today.  Atypical a flutter vs atrial tach.    At discharge she did have SR with pacing.     Recent Labs: 05/17/2015: B Natriuretic Peptide 129.9*; Hemoglobin 13.4; Platelets 231; TSH 2.942 05/18/2015: ALT 20; BUN 28*; Creatinine, Ser 1.36*; Potassium 3.8; Sodium 142    Lipid Panel    Component Value Date/Time   CHOL 136 01/02/2015 0847   TRIG 67 01/02/2015 0847   HDL 62 01/02/2015 0847   CHOLHDL 2.2 01/02/2015 0847   VLDL 13 01/02/2015 0847   LDLCALC 61 01/02/2015 0847       Other studies Reviewed: Additional studies/ records that were reviewed today include: hospital notes, nuc study.. NUC study:  There was no ST segment deviation noted during stress.  This is a low risk study.  Low risk stress nuclear study with a small, severe, partially reversible distal anterior/apical defect; findings consistent with prior small infarct and trivial peri-infarct ischemia; study not gated due to baseline atrial flutter.  ASSESSMENT AND PLAN:  1.  Chest pain and elevated troponin most likely due to tachycardia lexiscan myoview was done and was low risk. She was in a flutter for study.  She has had 2 episodes of chest pain since d/c described as fluttering feeling. She rests and usuall improves but if not she  Will take NTG has had 2 since d/c.  2. PAF though other notes are permanent a fib.  On EKGs since 2016 she has a flutter or atypical a flutter.  Now with increased rate.  This despite cardizem at 240 mg.  discussed with Dr. Tamala Julian and Dr. Curt Bears will have her wear a 49 hour holter monitor to evaluate what rhythm she is  staying in.  Will also refer today's note to Dr.  Irish Lack for further recommendations.   i have made her a follow up with a fib clinic post holter.  Her meds are at upper limits in 82 year old female.  3.  Chronic anticoagulation on coumadin.CHADS2VASC=6  4. Essential hypertension stable.     Current medicines are reviewed with the patient today.  The patient Has no concerns regarding medicines.  The following changes have been made:  See above Labs/ tests ordered today include:see above  Disposition:   FU:  see above  Lennie Muckle, NP  06/11/2015 9:58 PM    [facility] [place] [place] [place], [place] [place], [place] Phone: [phone]; Fax: [phone]

## 2015-06-11 ENCOUNTER — Encounter: Payer: Self-pay | Admitting: Cardiology

## 2015-06-11 ENCOUNTER — Ambulatory Visit (INDEPENDENT_AMBULATORY_CARE_PROVIDER_SITE_OTHER): Payer: Medicare Other | Admitting: Cardiology

## 2015-06-11 ENCOUNTER — Encounter (INDEPENDENT_AMBULATORY_CARE_PROVIDER_SITE_OTHER): Payer: Medicare Other

## 2015-06-11 VITALS — BP 122/58 | HR 103 | Ht 60.0 in | Wt 179.4 lb

## 2015-06-11 DIAGNOSIS — Z950 Presence of cardiac pacemaker: Secondary | ICD-10-CM

## 2015-06-11 DIAGNOSIS — I4891 Unspecified atrial fibrillation: Secondary | ICD-10-CM | POA: Diagnosis not present

## 2015-06-11 DIAGNOSIS — I495 Sick sinus syndrome: Secondary | ICD-10-CM | POA: Diagnosis not present

## 2015-06-11 DIAGNOSIS — I4892 Unspecified atrial flutter: Secondary | ICD-10-CM | POA: Diagnosis not present

## 2015-06-11 DIAGNOSIS — I25118 Atherosclerotic heart disease of native coronary artery with other forms of angina pectoris: Secondary | ICD-10-CM | POA: Diagnosis not present

## 2015-06-11 DIAGNOSIS — I200 Unstable angina: Secondary | ICD-10-CM

## 2015-06-11 DIAGNOSIS — I10 Essential (primary) hypertension: Secondary | ICD-10-CM

## 2015-06-11 NOTE — Patient Instructions (Addendum)
Medication Instructions:  Same-no changes  Labwork: None  Testing/Procedures: Your physician has recommended that you wear a 48 hour holter monitor. Holter monitors are medical devices that record the heart's electrical activity. Doctors most often use these monitors to diagnose arrhythmias. Arrhythmias are problems with the speed or rhythm of the heartbeat. The monitor is a small, portable device. You can wear one while you do your normal daily activities. This is usually used to diagnose what is causing palpitations/syncope (passing out).  Follow-Up: Your physician recommends that you schedule a follow-up appointment in: 1 week post Holter monitor with Laroy Apple, NP     If you need a refill on your cardiac medications before your next appointment, please call your pharmacy.

## 2015-06-12 ENCOUNTER — Telehealth: Payer: Self-pay | Admitting: Interventional Cardiology

## 2015-06-12 DIAGNOSIS — Z7901 Long term (current) use of anticoagulants: Secondary | ICD-10-CM | POA: Diagnosis not present

## 2015-06-12 DIAGNOSIS — Z1389 Encounter for screening for other disorder: Secondary | ICD-10-CM | POA: Diagnosis not present

## 2015-06-12 DIAGNOSIS — Z0000 Encounter for general adult medical examination without abnormal findings: Secondary | ICD-10-CM | POA: Diagnosis not present

## 2015-06-12 DIAGNOSIS — G4733 Obstructive sleep apnea (adult) (pediatric): Secondary | ICD-10-CM | POA: Diagnosis not present

## 2015-06-12 DIAGNOSIS — I10 Essential (primary) hypertension: Secondary | ICD-10-CM | POA: Diagnosis not present

## 2015-06-12 DIAGNOSIS — R21 Rash and other nonspecific skin eruption: Secondary | ICD-10-CM | POA: Diagnosis not present

## 2015-06-12 DIAGNOSIS — I481 Persistent atrial fibrillation: Secondary | ICD-10-CM | POA: Diagnosis not present

## 2015-06-12 DIAGNOSIS — E7800 Pure hypercholesterolemia, unspecified: Secondary | ICD-10-CM | POA: Diagnosis not present

## 2015-06-12 DIAGNOSIS — I2510 Atherosclerotic heart disease of native coronary artery without angina pectoris: Secondary | ICD-10-CM | POA: Diagnosis not present

## 2015-06-12 NOTE — Telephone Encounter (Signed)
Spoke with pt and she states that for the last few days she has had a low BP in the morning. Yesterday AM 92/50's, today AM 85/53, then 87/50's.  No HR available. Pt seen yesterday by Cecilie Kicks, NP for OV and BP was 122/58, 103. Pt states appetite has been slightly decreased and she has been sleeping a little more. Pt seen by Dr. Delfina Redwood, PCP, today and he advised her to call our office because he believed it was from her Diltiazem. Pt thought she had been taking Diltiazem 240mg  but she spoke with Jeani Hawking, Dr. Hassell Done nurse on 4/21 and she had advised pt to decreased Diltiazem 180mg  QD. Had pt review bottles of medication in order to determine which dose she has actually been taking. Pt verified that she has actually been taking Diltiazem 180mg  QD. Reviewed with Cecilie Kicks, NP and she said to have pt hold her Diltiazem tomorrow and check her BP and HR and call our office with this reading. Advised pt of recommendations per Cecilie Kicks, NP and she was in agreement with this plan. Will forward to Dr. Hassell Done nurse to make her aware for when pt calls back.

## 2015-06-12 NOTE — Telephone Encounter (Signed)
New Message:   Please call today,pt blood pressure today have been 80 to 90/52 to 53. Pt have seen her primary care doctor today,he wonder if the Diltiazem is causing her pressure to be low. Please call to advisee,very concerned.

## 2015-06-13 ENCOUNTER — Other Ambulatory Visit: Payer: Self-pay

## 2015-06-13 ENCOUNTER — Ambulatory Visit (INDEPENDENT_AMBULATORY_CARE_PROVIDER_SITE_OTHER): Payer: Medicare Other | Admitting: Pharmacist

## 2015-06-13 DIAGNOSIS — I4891 Unspecified atrial fibrillation: Secondary | ICD-10-CM

## 2015-06-13 DIAGNOSIS — Z5181 Encounter for therapeutic drug level monitoring: Secondary | ICD-10-CM

## 2015-06-13 LAB — POCT INR: INR: 3.7

## 2015-06-13 MED ORDER — DILTIAZEM HCL ER COATED BEADS 180 MG PO CP24: 180.0000 mg | ORAL_CAPSULE | Freq: Every day | ORAL | Status: DC

## 2015-06-13 NOTE — Telephone Encounter (Addendum)
The pt states that she took her last dose of Diltiazem yesterday at 8:30 am. This morning at 7:30 am her BP was 98/61 with a HR of 100                        At 8:30 am her BP was 106/57                         At 9:30 am her BP was 110/67                        At 12:00 pm her BP was 109/64 with a HR of 110. Pt states that she will continue to hold Diltiazem until advisement from Cecilie Kicks, NP. Call pt back at [phone].  The pt is aware that I am forwarding this message to Cecilie Kicks, NP and that we will contact her with Laura's recommendations when available. She verbalized understanding.

## 2015-06-13 NOTE — Telephone Encounter (Addendum)
The pts BP will be checked tomorrow while she is in the office getting a holter monitor. She is aware and is in agreement with plan. BP reading will be forwarded to Cecilie Kicks, NP for recommendations as the pts diltiazem is on hold.

## 2015-06-13 NOTE — Telephone Encounter (Signed)
Follow Up::    Pt said she talked to you yesterday,says she needs to give you some information.

## 2015-06-13 NOTE — Telephone Encounter (Signed)
I offered the pt an apt for tomorrow with Ignacia Bayley, NP but the pt cannot make that apt. as she states that they only have 1 car. She is scheduled to be here tomorrow to get a 48 hour holter monitor and then her husband has 2 apts after that. She is also scheduled to see Roderic Palau on 5/19. She wants to know if the holter monitor and her apt with Roderic Palau, NP will be ok or does she still need to be seen sooner. Please advise.

## 2015-06-13 NOTE — Telephone Encounter (Signed)
She should be seen in [place].  Or Dr. [name] who know who also  Has seen- but he is out.  But I worry why her BP is low.  Thanks.

## 2015-06-13 NOTE — Telephone Encounter (Signed)
If BP remains low she should be seen, if we could check her BP when they put on monitor?  Thanks.

## 2015-06-13 NOTE — Telephone Encounter (Signed)
**Note De-Identified [name] Obfuscation** LMTCB. Will offer pt appt with Ignacia Bayley, NP tomorrow at 3:30.

## 2015-06-14 ENCOUNTER — Ambulatory Visit (INDEPENDENT_AMBULATORY_CARE_PROVIDER_SITE_OTHER): Payer: Medicare Other

## 2015-06-14 ENCOUNTER — Ambulatory Visit: Payer: Medicare Other | Admitting: Nurse Practitioner

## 2015-06-14 ENCOUNTER — Telehealth: Payer: Self-pay | Admitting: Interventional Cardiology

## 2015-06-14 ENCOUNTER — Ambulatory Visit: Payer: Medicare Other

## 2015-06-14 VITALS — BP 140/88 | HR 94 | Resp 18

## 2015-06-14 DIAGNOSIS — I10 Essential (primary) hypertension: Secondary | ICD-10-CM | POA: Diagnosis not present

## 2015-06-14 NOTE — Telephone Encounter (Signed)
Spoke with pt while she was here for NV and monitor and informed her of info provided by Cecilie Kicks, NP. Pt verbalized understanding and was in agreement with this plan.

## 2015-06-14 NOTE — Progress Notes (Signed)
1.) Reason for visit: BP Check  2.) Name of MD requesting visit: Cecilie Kicks, NP  3.) ROS related to problem: BP 140/88, HR 94.  Pt states she was feeling anxious because of a time constraint to get to another appointment for her husband.  4.) Assessment and plan per MD: This will be forwarded to Cecilie Kicks to review and advise.

## 2015-06-14 NOTE — Telephone Encounter (Signed)
New message    Pt c/o medication issue:  1. Name of Medication: Diltiazem  2. How are you currently taking this medication (dosage and times per day)? 180 mg po once a daily  3. Are you having a reaction (difficulty breathing--STAT)? no  4. What is your medication issue? The medications was stopped completely and now the pt is having chest discomfort, feels like she is having Angina   Pt c/o of Chest Pain: STAT if CP now or developed within 24 hours  1. Are you having CP right now? no  2. Are you experiencing any other symptoms (ex. SOB, nausea, vomiting, sweating)? Some SOB last night work up from sleep  3. How long have you been experiencing CP? Since last night chest discomfort  4. Is your CP continuous or coming and going? Chest discomfort   5. Have you taken Nitroglycerin? 1 tablet this morning around 0900 on May 11th   Pt is concerned that she may not be able to have a heart monitor connected later today. But the pt states she will be here for her appointment. At 12:00

## 2015-06-14 NOTE — Telephone Encounter (Signed)
Pt has not had any Diltiazem since Monday morning. Recent vitals:  Wed AM 110/67, 110 Wed 10pm 117/60, 112 Thurs 7:30AM 131/79, 111 Pt felt like she was having some mild angina this morning, took one nitro at 0900 and it resolved. Pt feels fine at this time, eating breakfast.  Took vitals at 0930 and they were 111/70, 109. Pt states that she is not sure if the angina correlates with not taking the Dilt or not because she has angina once about every two weeks and it resolves after one Nitro. Advised pt to come in today for her BP check and monitor appt. Pt verbalized understanding and was in agreement with this plan and will proceed to ER if angina returns. Will route to Cecilie Kicks, NP for review.

## 2015-06-14 NOTE — Telephone Encounter (Signed)
Ok continue to hold dilt.  Unless BP > 120 then can take.  Only because her HR is climbing.  I did hear from Dr. Irish Lack and he feels after holter to keep appt with A fib clinic and plan on rate control of the a fib.  thanks

## 2015-06-15 ENCOUNTER — Telehealth: Payer: Self-pay | Admitting: Internal Medicine

## 2015-06-15 ENCOUNTER — Telehealth: Payer: Self-pay | Admitting: Interventional Cardiology

## 2015-06-15 NOTE — Telephone Encounter (Signed)
New message      The pt is calling concerning the monitor that was hooked up to the pt has been stopped the pt was wondering if she needs to continue Diltiazem, The other question Cecilie Kicks started the pt on a mobile monitor machine, Set-up by [facility] yesterday afternoon.   Pt c/o medication issue:  1. Name of Medication: Diltiazem  2. How are you currently taking this medication (dosage and times per day)? 180 mg 1 tablet in the am, last dose pt had Monday morning  3. Are you having a reaction (difficulty breathing--STAT)? no  4. What is your medication issue? Dr. Irish Lack asked pt to hold til monitor test was over pt concerned that they have been off too long. And with the monitor not downloading signal the pt is concerned about it not working properly.    Patient c/o Palpitations:  High priority if patient c/o lightheadedness and shortness of breath.  1. How long have you been having palpitations? Earlier today hr-111  2. Are you currently experiencing lightheadedness and shortness of breath? no  3. Have you checked your BP and heart rate? (document readings) checked about a hour ago at 1:30pm b/p 122/63, hr-111, around 12;30pm b/p 117/67 and hr-117  4. Are you experiencing any other symptoms? The pt is concerned about the high heart rate. And it's close to the weekend she's making sure everything is ok, and the monitor was not recording

## 2015-06-15 NOTE — Telephone Encounter (Signed)
Spoke w/ pt and she need to speak w/ Holter monitor department. Pt believes that her holter monitor is not working properly and she has been experiencing symptoms.

## 2015-06-15 NOTE — Telephone Encounter (Signed)
New message      Talk to the nurse about her monitor not functioning

## 2015-06-15 NOTE — Telephone Encounter (Signed)
Spoke with patient and she is having to have another monitor put on secondary to hers not working correctly She was not sure if she should continue Diltiazem or leave off Isaiah Serge, NP at 06/14/2015 10:23 AM     Status: [place] continue to hold dilt. Unless BP > 120 then can take. Only because her HR is climbing. I did hear from Dr. Irish Lack and he feels after holter to keep appt with [facility] and plan on rate control of the a fib. thanks       Advised patient of Mickel Baas I NP recommendations, verbalized understanding

## 2015-06-15 NOTE — Telephone Encounter (Signed)
Spoke with the patient. It seems like her monitor is malfunctioning she will take it off and we will reapply next week.

## 2015-06-17 NOTE — Telephone Encounter (Signed)
Continue diltiazem as noted.  On previous note.

## 2015-06-18 ENCOUNTER — Telehealth: Payer: Self-pay | Admitting: Interventional Cardiology

## 2015-06-18 NOTE — Telephone Encounter (Signed)
The pt was advised per phone note on 06/15/15.

## 2015-06-18 NOTE — Telephone Encounter (Signed)
F/U call.. Please call her at the home number

## 2015-06-18 NOTE — Telephone Encounter (Signed)
Continue diltiazem. 

## 2015-06-18 NOTE — Telephone Encounter (Signed)
New message     Per Pt she wants to know if she needs to continue to wear monitor the monitor is not working right., it was connected for her at the clinic last week.    Pt c/o medication issue:  1. Name of Medication: Diltiazem  2. How are you currently taking this medication (dosage and times per day)? 180 mg taking once a day  3. Are you having a reaction (difficulty breathing--STAT)? no  4. What is your medication issue? The pt is wondering if she needs to continue taking the medication since the monitor is not right

## 2015-06-18 NOTE — Telephone Encounter (Signed)
**Note De-Identified Patty [name] Obfuscation** The pt is advised per Cecilie Kicks, NP to take Diltiazem daily unless her BP > 120 then she can take. She verbalized understanding.  She states that she has already discussed her monitor issue with [facility], monitor tech.

## 2015-06-22 ENCOUNTER — Encounter (HOSPITAL_COMMUNITY): Payer: Self-pay | Admitting: Nurse Practitioner

## 2015-06-22 ENCOUNTER — Ambulatory Visit (HOSPITAL_COMMUNITY)
Admission: RE | Admit: 2015-06-22 | Discharge: 2015-06-22 | Disposition: A | Discharge status: Home / Self Care | Payer: Medicare Other | Source: Ambulatory Visit | Attending: Nurse Practitioner | Admitting: Nurse Practitioner

## 2015-06-22 VITALS — BP 130/72 | HR 103 | Ht 60.0 in | Wt 177.8 lb

## 2015-06-22 DIAGNOSIS — I25119 Atherosclerotic heart disease of native coronary artery with unspecified angina pectoris: Secondary | ICD-10-CM | POA: Insufficient documentation

## 2015-06-22 DIAGNOSIS — I5040 Unspecified combined systolic (congestive) and diastolic (congestive) heart failure: Secondary | ICD-10-CM | POA: Diagnosis not present

## 2015-06-22 DIAGNOSIS — R000 Tachycardia, unspecified: Secondary | ICD-10-CM | POA: Diagnosis not present

## 2015-06-22 DIAGNOSIS — I701 Atherosclerosis of renal artery: Secondary | ICD-10-CM | POA: Insufficient documentation

## 2015-06-22 DIAGNOSIS — I739 Peripheral vascular disease, unspecified: Secondary | ICD-10-CM | POA: Insufficient documentation

## 2015-06-22 DIAGNOSIS — I471 Supraventricular tachycardia: Secondary | ICD-10-CM

## 2015-06-22 DIAGNOSIS — Z87891 Personal history of nicotine dependence: Secondary | ICD-10-CM | POA: Insufficient documentation

## 2015-06-22 DIAGNOSIS — Z9049 Acquired absence of other specified parts of digestive tract: Secondary | ICD-10-CM | POA: Insufficient documentation

## 2015-06-22 DIAGNOSIS — G4733 Obstructive sleep apnea (adult) (pediatric): Secondary | ICD-10-CM | POA: Diagnosis not present

## 2015-06-22 DIAGNOSIS — I252 Old myocardial infarction: Secondary | ICD-10-CM | POA: Diagnosis not present

## 2015-06-22 DIAGNOSIS — I482 Chronic atrial fibrillation: Secondary | ICD-10-CM | POA: Diagnosis not present

## 2015-06-22 DIAGNOSIS — Z7901 Long term (current) use of anticoagulants: Secondary | ICD-10-CM | POA: Diagnosis not present

## 2015-06-22 DIAGNOSIS — E785 Hyperlipidemia, unspecified: Secondary | ICD-10-CM | POA: Insufficient documentation

## 2015-06-22 DIAGNOSIS — I495 Sick sinus syndrome: Secondary | ICD-10-CM | POA: Diagnosis not present

## 2015-06-22 DIAGNOSIS — E669 Obesity, unspecified: Secondary | ICD-10-CM | POA: Diagnosis not present

## 2015-06-22 DIAGNOSIS — Z950 Presence of cardiac pacemaker: Secondary | ICD-10-CM | POA: Diagnosis not present

## 2015-06-22 DIAGNOSIS — I110 Hypertensive heart disease with heart failure: Secondary | ICD-10-CM | POA: Diagnosis not present

## 2015-06-22 DIAGNOSIS — Z0189 Encounter for other specified special examinations: Secondary | ICD-10-CM | POA: Diagnosis present

## 2015-06-22 MED ORDER — DILTIAZEM HCL ER COATED BEADS 120 MG PO CP24: 120.0000 mg | ORAL_CAPSULE | Freq: Every day | ORAL | Status: DC

## 2015-06-22 NOTE — Patient Instructions (Addendum)
Your physician has recommended you make the following change in your medication:  1)Cardizem 120mg  once a day .

## 2015-06-22 NOTE — Progress Notes (Signed)
Patient ID: Patty [name], female   DOB: 06-14-1930, 82 y.o.   MRN: [id number]    Primary Care Physician: Patty Hams, MD Referring Physician: Dr. Aurea Graff E [name] is a 82 y.o. female with a h/o permanent afib on coumadin, St Jude leadless PPM for junctional rhythm, CAD stents x 2 LAD & x 1 CFX 2000, rotational atherectomy to pCFX/pLAD & DES LAD 08/2012 chronic OM1 100%, DES CFX 11/2012, DES LAD for ISR 10/2013, DES oLAD [id number], diastolic CHF and HTN.   On 05/17/15 at approximately 7:30 AM, she was wakened from sleep by chest pain, her usual angina. She took nitroglycerin with improvement but had to keep taking them because the symptoms did not resolve. She was a little short of breath with this. She took her usual dose of Lasix and her clopidogrel, and got nauseated so did not take any additional medications. She did not vomit. She did not have any diaphoresis. When the third dose of nitroglycerin did not relieve her pain, she felt she needed to be seen and came to the emergency room.   In the emergency room, she was in rapid atrial flutter with a left bundle branch block. She was started on IV Cardizem. She became bradycardic with rates in 50's, so IV Cardizem was stopped, she was put on po diltiazem. Her troponin was mildly elevated at 0.26-0.40-0.37. She converted to NSR and no additional chest pain. It was felt that her elevation in troponin was in the setting of her rapid Afib.   She ambulated in hallways without angina and SOB. She had outpatient Lexiscan Myoview. low risk nuc study --she was noted to be in a flutter on nuc study.D/c home with Cardizem increased to 240 mg a day from 180 mg a day.   She was seen by Patty Kicks, NP 5/8, with 2 episodes of chest pain and fluttering for which she took a NTG with relief, HR more elevated over last few days, questioning aflutter or atach. 2 day Holter monitor was ordered but malfunctioned and will be pending another monitor 5/30.     She reported one day of low blood pressure and was told to stop the Cardizem. Since stopping Cardizem has had higher v rates and more angina symptoms. NTG sl may also be contributing to lower BP's. BP is office today is 130 /80 and other home BP's have been sufficient. Leadless pacer interrogated shows HR between a regular 100-140 for the last two weeks. Due to being leadless PPM, cannot determine atrial rhythm from interrogation.    Today, she denies symptoms of palpitations, chest pain, shortness of breath, orthopnea, PND, lower extremity edema, dizziness, presyncope, syncope, or neurologic sequela. The patient is tolerating medications without difficulties and is otherwise without complaint today.   Past Medical History  Diagnosis Date  . Hypertension   . Hyperlipidemia   . Obesity   . Diverticulitis   . Peripheral vascular disease ([place])   . Varicose veins   . Granulosa cell carcinoma ([place])     abd; last episode was in 2009  . Tachycardia-bradycardia syndrome ([place])     a. s/p leadless pacemaker ([name]) implanted by Dr Patty [name]  . Permanent atrial fibrillation ([place]) 2013  . Combined systolic and diastolic heart failure, NYHA class 3 ([place])   . Coronary artery disease     a. s/p multiple stents  b. LHC 12/14/13 with sig CAD with TO of OM1, 70-80% occusion OM2, 50-70% mLCx, 50-70% pLAD (recent FFR 0.83), diffusely  diseased RCA, widely patent stetns in pLAD and LCx.  Marland Kitchen LBBB (left bundle branch block)        . Myocardial infarction ([place]) 2002  . Renal artery stenosis ([place])   . Pacemaker - St Jude Leadless PPM   . Chronic anticoagulation - coumadin, CHADS2VASC=6 05/17/2015  . OSA on CPAP    Past Surgical History  Procedure Laterality Date  . Cholecystectomy  1980's  . Salivary gland surgery  2000's    "had a little lump removed; granulosa related; it was benign" (08/11/2012)  . Abdominal hysterectomy    . Colon surgery  2004    colectomy for diverticulosis  . Granulosa tumor  excision  2000; 2003; 2004; 2007    "all in my abdomen including small intestines, outside my ?uterus/etc" (08/11/2012)  . Tee without cardioversion  12/31/2011    Procedure: TRANSESOPHAGEAL ECHOCARDIOGRAM (TEE);  Surgeon: Patty Booze, MD;  Location: [place];  Service: Cardiovascular;  Laterality: N/A;  . Cardioversion  12/31/2011    Procedure: CARDIOVERSION;  Surgeon: Patty Booze, MD;  Location: [place];  Service: Cardiovascular;  Laterality: N/A;  . Cardiac catheterization  09/03/2007    EF 70%; Failed attempt at PCI to OM  . Cardiac catheterization  11/01/2003    EF 70%  . Coronary angioplasty with stent placement  2000; 08/11/2012; 11/12/2012    3 + 2 LAD & CFX; 2nd CFX stent 11/12/2012  . Varicose vein surgery Bilateral 1977  . Hernia repair  2005    "laparoscopic"  . Cataract extraction, bilateral  2015  . Cardioversion N/A 12/31/2011    Procedure: CARDIOVERSION;  Surgeon: Patty Booze, MD;  Location: [facility] CATH LAB;  Service: Cardiovascular;  Laterality: N/A;  . Permanent pacemaker insertion N/A 03/16/2012    [name] (SJM) leadless pacemaker (LEADLESS II STUDY PATEINT)  . Percutaneous coronary intervention-balloon only  08/04/2012    Procedure: PERCUTANEOUS CORONARY INTERVENTION-BALLOON ONLY;  Surgeon: Patty Booze, MD;  Location: [facility] C[facility] LAB;  Service: Cardiovascular;;  . Percutaneous coronary rotoblator intervention (pci-r) N/A 08/11/2012    Procedure: PERCUTANEOUS CORONARY ROTOBLATOR INTERVENTION (PCI-R);  Surgeon: Patty Booze, MD;  Location: [facility] CATH LAB;  Service: Cardiovascular;  Laterality: N/A;  . Left heart catheterization with coronary angiogram N/A 11/12/2012    Procedure: LEFT HEART CATHETERIZATION WITH CORONARY ANGIOGRAM;  Surgeon: Patty Booze, MD;  Location: [facility] CATH LAB;  Service: Cardiovascular;  Laterality: N/A;  . Left heart catheterization with coronary angiogram N/A 10/07/2013    Procedure: LEFT HEART CATHETERIZATION WITH  CORONARY ANGIOGRAM;  Surgeon: Patty Booze, MD;  Location: [facility] CATH LAB;  Service: Cardiovascular;  Laterality: N/A;  . Percutaneous coronary stent intervention (pci-s)  10/07/2013    Procedure: PERCUTANEOUS CORONARY STENT INTERVENTION (PCI-S);  Surgeon: Patty Booze, MD;  Location: [facility] CATH LAB;  Service: Cardiovascular;;  . Fractional flow reserve wire  10/07/2013    Procedure: FRACTIONAL FLOW RESERVE WIRE;  Surgeon: Patty Booze, MD;  Location: [facility] CATH LAB;  Service: Cardiovascular;;  . Left heart catheterization with coronary angiogram N/A 12/14/2013    Procedure: LEFT HEART CATHETERIZATION WITH CORONARY ANGIOGRAM;  Surgeon: Sinclair Grooms, MD;  Location: [facility] CATH LAB;  Service: Cardiovascular;  Laterality: N/A;  . Left heart catheterization with coronary angiogram N/A 05/16/2014    Procedure: LEFT HEART CATHETERIZATION WITH CORONARY ANGIOGRAM;  Surgeon: Sherren Mocha, MD;  Location: [facility] CATH LAB;  Service: Cardiovascular;  Laterality: N/A;    Current Outpatient Prescriptions  Medication Sig Dispense Refill  .  atorvastatin (LIPITOR) 80 MG tablet TAKE 1 TABLET BY MOUTH EVERY MORNING 30 tablet 3  . beta carotene w/minerals (OCUVITE) tablet Take 2 tablets by mouth daily.    . Calcium Carb-Cholecalciferol (CALTRATE 600+D) 600-800 MG-UNIT TABS Take 1 tablet by mouth every morning.     . clopidogrel (PLAVIX) 75 MG tablet TAKE 1 TABLET BY MOUTH EVERY DAY WITH BREAKFAST 30 tablet 11  . Coenzyme Q-10 100 MG capsule Take 200 mg by mouth at bedtime.     Marland Kitchen diltiazem (CARDIZEM CD) 120 MG 24 hr capsule Take 1 capsule (120 mg total) by mouth daily. 30 capsule 6  . furosemide (LASIX) 40 MG tablet Take 1 tablet (40 mg total) by mouth 2 (two) times daily. 180 tablet 3  . isosorbide mononitrate (IMDUR) 120 MG 24 hr tablet Take 1 tablet (120 mg total) by mouth daily. 30 tablet 6  . lisinopril (PRINIVIL,ZESTRIL) 5 MG tablet TAKE 1 TABLET BY MOUTH 2 TIMES DAILY 60 tablet 3  . metoprolol  (LOPRESSOR) 100 MG tablet Take 1 tablet (100 mg total) by mouth 2 (two) times daily. 60 tablet 11  . Multiple Vitamin (MULTIVITAMIN) capsule Take 1 capsule by mouth daily.    Marland Kitchen NITROSTAT 0.4 MG SL tablet PLACE 1 TABLET (0.4 MG TOTAL) UNDER THE TONGUE EVERY 5 (FIVE) MINUTES AS NEEDED. FOR CHEST PAIN 25 tablet 2  . Omega-3 Fatty Acids (FISH OIL PO) Take 1,200 mg by mouth daily.     . potassium chloride SA (K-DUR,KLOR-CON) 20 MEQ tablet Take 1 tablet (20 mEq total) by mouth daily. 30 tablet 6  . spironolactone (ALDACTONE) 25 MG tablet Take 0.5 tablets (12.5 mg total) by mouth daily. Take additional 1/2 tablet daily as needed for edema. If you have to take 2 to 3 times a week contact Dr Irish Lack.. 45 tablet 11  . warfarin (COUMADIN) 2 MG tablet Take 1-2 mg by mouth daily. 1mg  on Tuesdays and Fridays. 2mg  all other days.     No current facility-administered medications for this encounter.    Allergies  Allergen Reactions  . Bee Venom Anaphylaxis  . Other Nausea And Vomiting    Pain medications cause severe vomiting. Tolerated slow IV morphine drip    Social History   Social History  . Marital Status: Married    Spouse Name: N/A  . Number of Children: N/A  . Years of Education: N/A   Occupational History  . Retired    Social History Main Topics  . Smoking status: Former Smoker -- 1.00 packs/day for 32 years    Types: Cigarettes    Quit date: 02/03/1974  . Smokeless tobacco: Never Used  . Alcohol Use: 3.0 oz/week    5 Glasses of wine per week     Comment: 11/12/2012 "4oz wine w/dinner 5 days/wk"  . Drug Use: No  . Sexual Activity: No   Other Topics Concern  . Not on file   Social History Narrative   Lives with family.    Family History  Problem Relation Age of Onset  . Heart attack Father   . Heart attack Brother   . Stroke Mother   . Diabetes Brother   . Diabetes Father   . Hypertension Brother   . Hypertension Father   . Kidney failure Brother     ROS- All  systems are reviewed and negative except as per the HPI above  Physical Exam: Filed Vitals:   06/22/15 0922  BP: 130/72  Pulse: 103  Height: 5' (1.524 m)  Weight: 177 lb 12.8 oz (80.65 kg)    GEN- The patient is well appearing, alert and oriented x 3 today.   Head- normocephalic, atraumatic Eyes-  Sclera clear, conjunctiva pink Ears- hearing intact Oropharynx- clear Neck- supple, no JVP Lymph- no cervical lymphadenopathy Lungs- Clear to ausculation bilaterally, normal work of breathing Heart- Regular rate and rhythm, no murmurs, rubs or gallops, PMI not laterally displaced GI- soft, NT, ND, + BS Extremities- no clubbing, cyanosis, or edema MS- no significant deformity or atrophy Skin- no rash or lesion Psych- euthymic mood, full affect Neuro- strength and sensation are intact  EKG-sinus tach with short PR, question aflutter or a tach at 103 bpm, Pr int 104 ms, qrs int 156 ms, qtc 513 ms St. Jude leadless pacer interrogated by [employer] with current intrinsic regular rhythm at 105 bpm, 30% of time at 100 bpm, 15% of time at 110 bpm, 10% of time at 120 bpm, and 5% at 130-140 bpm. 13.8 years left on battery life and all test results were in normal limits. With leadless PPM, unable to determine atrial activity Recent holter monitor inconclusive due to malfunction of monitor Epic records reviewed    Assessment and Plan: 1. Permanent afib  Continue warfarin Continue  Metoprolol  2. Recent tachycardia If Aflutter or atach, question if could be ablated. Holter pending 5/30 Feel elevated hear rate is triggering angina Restart Cardizem at 120 mg a day and watch BP carefully, hopefully will not require as much of sl ntg if HR can be slowed down  3. CAD Chronic angina and elevated heart rate seems to be aggravating Continue BB, plavix, statin, imdur, ace and sl ntg as needed  F/u 2 weeks  Butch Penny C. Cordel Drewes, [facility] [address] [place], [place] [address] [phone]

## 2015-06-26 ENCOUNTER — Telehealth (HOSPITAL_COMMUNITY): Payer: Self-pay | Admitting: *Deleted

## 2015-06-26 NOTE — Telephone Encounter (Signed)
Pt called in to update since office visit. Feeling great HR in the low 90s. SBP 110-120. Much more energy reported and no chest pain. Will await monitor placement and results.

## 2015-06-28 ENCOUNTER — Ambulatory Visit (INDEPENDENT_AMBULATORY_CARE_PROVIDER_SITE_OTHER): Payer: Medicare Other | Admitting: Pharmacist

## 2015-06-28 DIAGNOSIS — Z5181 Encounter for therapeutic drug level monitoring: Secondary | ICD-10-CM

## 2015-06-28 DIAGNOSIS — I4891 Unspecified atrial fibrillation: Secondary | ICD-10-CM

## 2015-06-28 LAB — POCT INR: INR: 3.7

## 2015-07-03 ENCOUNTER — Ambulatory Visit (INDEPENDENT_AMBULATORY_CARE_PROVIDER_SITE_OTHER): Payer: Medicare Other

## 2015-07-03 DIAGNOSIS — I4892 Unspecified atrial flutter: Secondary | ICD-10-CM

## 2015-07-10 ENCOUNTER — Ambulatory Visit (HOSPITAL_COMMUNITY): Payer: Medicare Other | Admitting: Nurse Practitioner

## 2015-07-12 ENCOUNTER — Ambulatory Visit (HOSPITAL_COMMUNITY)
Admission: RE | Admit: 2015-07-12 | Discharge: 2015-07-12 | Disposition: A | Discharge status: Home / Self Care | Payer: Medicare Other | Source: Ambulatory Visit | Attending: Nurse Practitioner | Admitting: Nurse Practitioner

## 2015-07-12 ENCOUNTER — Ambulatory Visit (INDEPENDENT_AMBULATORY_CARE_PROVIDER_SITE_OTHER): Payer: Medicare Other | Admitting: *Deleted

## 2015-07-12 ENCOUNTER — Encounter (HOSPITAL_COMMUNITY): Payer: Self-pay | Admitting: Nurse Practitioner

## 2015-07-12 VITALS — BP 156/68 | HR 50 | Ht 60.0 in | Wt 178.8 lb

## 2015-07-12 DIAGNOSIS — Z8249 Family history of ischemic heart disease and other diseases of the circulatory system: Secondary | ICD-10-CM | POA: Insufficient documentation

## 2015-07-12 DIAGNOSIS — Z823 Family history of stroke: Secondary | ICD-10-CM | POA: Diagnosis not present

## 2015-07-12 DIAGNOSIS — Z87891 Personal history of nicotine dependence: Secondary | ICD-10-CM | POA: Insufficient documentation

## 2015-07-12 DIAGNOSIS — I482 Chronic atrial fibrillation: Secondary | ICD-10-CM | POA: Diagnosis not present

## 2015-07-12 DIAGNOSIS — G4733 Obstructive sleep apnea (adult) (pediatric): Secondary | ICD-10-CM | POA: Insufficient documentation

## 2015-07-12 DIAGNOSIS — Z950 Presence of cardiac pacemaker: Secondary | ICD-10-CM | POA: Insufficient documentation

## 2015-07-12 DIAGNOSIS — Z833 Family history of diabetes mellitus: Secondary | ICD-10-CM | POA: Diagnosis not present

## 2015-07-12 DIAGNOSIS — I5042 Chronic combined systolic (congestive) and diastolic (congestive) heart failure: Secondary | ICD-10-CM | POA: Insufficient documentation

## 2015-07-12 DIAGNOSIS — I2510 Atherosclerotic heart disease of native coronary artery without angina pectoris: Secondary | ICD-10-CM | POA: Insufficient documentation

## 2015-07-12 DIAGNOSIS — R000 Tachycardia, unspecified: Secondary | ICD-10-CM | POA: Diagnosis not present

## 2015-07-12 DIAGNOSIS — Z7901 Long term (current) use of anticoagulants: Secondary | ICD-10-CM | POA: Diagnosis not present

## 2015-07-12 DIAGNOSIS — E669 Obesity, unspecified: Secondary | ICD-10-CM | POA: Diagnosis not present

## 2015-07-12 DIAGNOSIS — I110 Hypertensive heart disease with heart failure: Secondary | ICD-10-CM | POA: Diagnosis not present

## 2015-07-12 DIAGNOSIS — I252 Old myocardial infarction: Secondary | ICD-10-CM | POA: Insufficient documentation

## 2015-07-12 DIAGNOSIS — Z5181 Encounter for therapeutic drug level monitoring: Secondary | ICD-10-CM | POA: Diagnosis not present

## 2015-07-12 DIAGNOSIS — Z7902 Long term (current) use of antithrombotics/antiplatelets: Secondary | ICD-10-CM | POA: Diagnosis not present

## 2015-07-12 DIAGNOSIS — Z79899 Other long term (current) drug therapy: Secondary | ICD-10-CM | POA: Diagnosis not present

## 2015-07-12 DIAGNOSIS — I739 Peripheral vascular disease, unspecified: Secondary | ICD-10-CM | POA: Diagnosis not present

## 2015-07-12 DIAGNOSIS — Z955 Presence of coronary angioplasty implant and graft: Secondary | ICD-10-CM | POA: Diagnosis not present

## 2015-07-12 DIAGNOSIS — I480 Paroxysmal atrial fibrillation: Secondary | ICD-10-CM

## 2015-07-12 DIAGNOSIS — E785 Hyperlipidemia, unspecified: Secondary | ICD-10-CM | POA: Diagnosis not present

## 2015-07-12 DIAGNOSIS — I4891 Unspecified atrial fibrillation: Secondary | ICD-10-CM

## 2015-07-12 DIAGNOSIS — Z6834 Body mass index (BMI) 34.0-34.9, adult: Secondary | ICD-10-CM | POA: Diagnosis not present

## 2015-07-12 LAB — POCT INR: INR: 2.5

## 2015-07-12 NOTE — Progress Notes (Signed)
Patient ID: Patty [name], female   DOB: 13-Aug-1930, 82 y.o.   MRN: [id number]    Primary Care Physician: Kandice Hams, MD Referring Physician: Dr. Aurea Graff E [name] is a 82 y.o. female with a h/o permanent afib on coumadin, St Jude leadless PPM for junctional rhythm, CAD stents x 2 LAD & x 1 CFX 2000, rotational atherectomy to pCFX/pLAD & DES LAD 08/2012 chronic OM1 100%, DES CFX 11/2012, DES LAD for ISR 10/2013, DES oLAD [id number], diastolic CHF and HTN.   On 05/17/15 at approximately 7:30 AM, she was wakened from sleep by chest pain, her usual angina. She took nitroglycerin with improvement but had to keep taking them because the symptoms did not resolve. She was a little short of breath with this. She took her usual dose of Lasix and her clopidogrel, and got nauseated so did not take any additional medications. She did not vomit. She did not have any diaphoresis. When the third dose of nitroglycerin did not relieve her pain, she felt she needed to be seen and came to the emergency room.   In the emergency room, she was in rapid atrial flutter with a left bundle branch block. She was started on IV Cardizem. She became bradycardic with rates in 50's, so IV Cardizem was stopped, she was put on po diltiazem. Her troponin was mildly elevated at 0.26-0.40-0.37. She converted to NSR and no additional chest pain. It was felt that her elevation in troponin was in the setting of her rapid Afib.   She ambulated in hallways without angina and SOB. She had outpatient Lexiscan Myoview. low risk nuc study --she was noted to be in a flutter on nuc study.D/c home with Cardizem increased to 240 mg a day from 180 mg a day.   She was seen by Cecilie Kicks, NP 5/8, with 2 episodes of chest pain and fluttering for which she took a NTG with relief, HR more elevated over last few days, questioning aflutter or atach. 2 day Holter monitor was ordered but malfunctioned and will be pending another monitor 5/30.     She reported one day of low blood pressure and was told to stop the Cardizem. Since stopping Cardizem has had higher v rates and more angina symptoms. NTG sl may also be contributing to lower BP's. BP is office today is 130 /80 and other home BP's have been sufficient. Leadless pacer interrogated shows HR between a regular 100-140 for the last two weeks. Due to being leadless PPM, cannot determine atrial rhythm from interrogation.  Returns 6/8 and reports that she has felt great since she restarted the Cardizem. No further episodes of fast heart rate or angina. Her husband was sick and had to be hospitalized several weeks ago and she has had to be more available to care for him. This has cut into her gym time and she feels her exercise tolerance is slipping. She hopes to get some in home help for her husband so she can have more free time to get to the gym.  Today, she denies symptoms of palpitations, chest pain, shortness of breath, orthopnea, PND, lower extremity edema, dizziness, presyncope, syncope, or neurologic sequela. The patient is tolerating medications without difficulties and is otherwise without complaint today.   Past Medical History  Diagnosis Date  . Hypertension   . Hyperlipidemia   . Obesity   . Diverticulitis   . Peripheral vascular disease ([place])   . Varicose veins   . Granulosa cell carcinoma (  [place])     abd; last episode was in 2009  . Tachycardia-bradycardia syndrome ([place])     a. s/p leadless pacemaker ([name]) implanted by Dr [name]  . Permanent atrial fibrillation ([place]) 2013  . Combined systolic and diastolic heart failure, NYHA class 3 ([place])   . Coronary artery disease     a. s/p multiple stents  b. LHC 12/14/13 with sig CAD with TO of OM1, 70-80% occusion OM2, 50-70% mLCx, 50-70% pLAD (recent FFR 0.83), diffusely diseased RCA, widely patent stetns in pLAD and LCx.  Patty [name] LBBB (left bundle branch block)        . Myocardial infarction ([place]) 2002  . Renal artery  stenosis ([place])   . Pacemaker - St Jude Leadless PPM   . Chronic anticoagulation - coumadin, CHADS2VASC=6 05/17/2015  . OSA on CPAP    Past Surgical History  Procedure Laterality Date  . Cholecystectomy  1980's  . Salivary gland surgery  2000's    "had a little lump removed; granulosa related; it was benign" (08/11/2012)  . Abdominal hysterectomy    . Colon surgery  2004    colectomy for diverticulosis  . Granulosa tumor excision  2000; 2003; 2004; 2007    "all in my abdomen including small intestines, outside my ?uterus/etc" (08/11/2012)  . Tee without cardioversion  12/31/2011    Procedure: TRANSESOPHAGEAL ECHOCARDIOGRAM (TEE);  Surgeon: [name], MD;  Location: [facility];  Service: Cardiovascular;  Laterality: N/A;  . Cardioversion  12/31/2011    Procedure: CARDIOVERSION;  Surgeon: [name], MD;  Location: [facility];  Service: Cardiovascular;  Laterality: N/A;  . Cardiac catheterization  09/03/2007    EF 70%; Failed attempt at PCI to OM  . Cardiac catheterization  11/01/2003    EF 70%  . Coronary angioplasty with stent placement  2000; 08/11/2012; 11/12/2012    3 + 2 LAD & CFX; 2nd CFX stent 11/12/2012  . Varicose vein surgery Bilateral 1977  . Hernia repair  2005    "laparoscopic"  . Cataract extraction, bilateral  2015  . Cardioversion N/A 12/31/2011    Procedure: CARDIOVERSION;  Surgeon: [name], MD;  Location: [facility] CATH LAB;  Service: Cardiovascular;  Laterality: N/A;  . Permanent pacemaker insertion N/A 03/16/2012    [name] (SJM) leadless pacemaker (LEADLESS II STUDY PATEINT)  . Percutaneous coronary intervention-balloon only  08/04/2012    Procedure: PERCUTANEOUS CORONARY INTERVENTION-BALLOON ONLY;  Surgeon: [name], MD;  Location: [facility] CATH LAB;  Service: Cardiovascular;;  . Percutaneous coronary rotoblator intervention (pci-r) N/A 08/11/2012    Procedure: PERCUTANEOUS CORONARY ROTOBLATOR INTERVENTION (PCI-R);  Surgeon: [name], MD;  Location: [facility] CATH LAB;  Service: Cardiovascular;  Laterality: N/A;  . Left heart catheterization with coronary angiogram N/A 11/12/2012    Procedure: LEFT HEART CATHETERIZATION WITH CORONARY ANGIOGRAM;  Surgeon: [name], MD;  Location: [facility] CATH LAB;  Service: Cardiovascular;  Laterality: N/A;  . Left heart catheterization with coronary angiogram N/A 10/07/2013    Procedure: LEFT HEART CATHETERIZATION WITH CORONARY ANGIOGRAM;  Surgeon: [name], MD;  Location: [facility] CATH LAB;  Service: Cardiovascular;  Laterality: N/A;  . Percutaneous coronary stent intervention (pci-s)  10/07/2013    Procedure: PERCUTANEOUS CORONARY STENT INTERVENTION (PCI-S);  Surgeon: [name], MD;  Location: [facility] CATH LAB;  Service: Cardiovascular;;  . Fractional flow reserve wire  10/07/2013    Procedure: FRACTIONAL FLOW RESERVE WIRE;  Surgeon: [name], MD;  Location: [facility] CATH LAB;  Service: Cardiovascular;;  .  Left heart catheterization with coronary angiogram N/A 12/14/2013    Procedure: LEFT HEART CATHETERIZATION WITH CORONARY ANGIOGRAM;  Surgeon: Sinclair Grooms, MD;  Location: [facility] CATH LAB;  Service: Cardiovascular;  Laterality: N/A;  . Left heart catheterization with coronary angiogram N/A 05/16/2014    Procedure: LEFT HEART CATHETERIZATION WITH CORONARY ANGIOGRAM;  Surgeon: Sherren Mocha, MD;  Location: [facility] CATH LAB;  Service: Cardiovascular;  Laterality: N/A;    Current Outpatient Prescriptions  Medication Sig Dispense Refill  . atorvastatin (LIPITOR) 80 MG tablet TAKE 1 TABLET BY MOUTH EVERY MORNING 30 tablet 3  . beta carotene w/minerals (OCUVITE) tablet Take 2 tablets by mouth daily.    . Calcium Carb-Cholecalciferol (CALTRATE 600+D) 600-800 MG-UNIT TABS Take 1 tablet by mouth every morning.     . clopidogrel (PLAVIX) 75 MG tablet TAKE 1 TABLET BY MOUTH EVERY DAY WITH BREAKFAST 30 tablet 11  . Coenzyme Q-10 100 MG capsule Take 200 mg by mouth at bedtime.     Patty [name]  diltiazem (CARDIZEM CD) 120 MG 24 hr capsule Take 1 capsule (120 mg total) by mouth daily. 30 capsule 6  . furosemide (LASIX) 40 MG tablet Take 1 tablet (40 mg total) by mouth 2 (two) times daily. 180 tablet 3  . isosorbide mononitrate (IMDUR) 120 MG 24 hr tablet Take 1 tablet (120 mg total) by mouth daily. 30 tablet 6  . lisinopril (PRINIVIL,ZESTRIL) 5 MG tablet TAKE 1 TABLET BY MOUTH 2 TIMES DAILY 60 tablet 3  . metoprolol (LOPRESSOR) 100 MG tablet Take 1 tablet (100 mg total) by mouth 2 (two) times daily. 60 tablet 11  . Multiple Vitamin (MULTIVITAMIN) capsule Take 1 capsule by mouth daily.    Patty [name] NITROSTAT 0.4 MG SL tablet PLACE 1 TABLET (0.4 MG TOTAL) UNDER THE TONGUE EVERY 5 (FIVE) MINUTES AS NEEDED. FOR CHEST PAIN 25 tablet 2  . Omega-3 Fatty Acids (FISH OIL PO) Take 1,200 mg by mouth daily.     . potassium chloride SA (K-DUR,KLOR-CON) 20 MEQ tablet Take 1 tablet (20 mEq total) by mouth daily. 30 tablet 6  . spironolactone (ALDACTONE) 25 MG tablet Take 0.5 tablets (12.5 mg total) by mouth daily. Take additional 1/2 tablet daily as needed for edema. If you have to take 2 to 3 times a week contact Dr Irish Lack.. 45 tablet 11  . warfarin (COUMADIN) 2 MG tablet Take 1-2 mg by mouth daily. 1mg  on Tuesdays and Fridays. 2mg  all other days.     No current facility-administered medications for this encounter.    Allergies  Allergen Reactions  . Bee Venom Anaphylaxis  . Other Nausea And Vomiting    Pain medications cause severe vomiting. Tolerated slow IV morphine drip    Social History   Social History  . Marital Status: Married    Spouse Name: N/A  . Number of Children: N/A  . Years of Education: N/A   Occupational History  . Retired    Social History Main Topics  . Smoking status: Former Smoker -- 1.00 packs/day for 32 years    Types: Cigarettes    Quit date: 02/03/1974  . Smokeless tobacco: Never Used  . Alcohol Use: 3.0 oz/week    5 Glasses of wine per week     Comment:  11/12/2012 "4oz wine w/dinner 5 days/wk"  . Drug Use: No  . Sexual Activity: No   Other Topics Concern  . Not on file   Social History Narrative   Lives with family.    Family History  Problem Relation Age of Onset  . Heart attack Father   . Heart attack Brother   . Stroke Mother   . Diabetes Brother   . Diabetes Father   . Hypertension Brother   . Hypertension Father   . Kidney failure Brother     ROS- All systems are reviewed and negative except as per the HPI above  Physical Exam: Filed Vitals:   07/12/15 1509  BP: 156/68  Pulse: 50  Height: 5' (1.524 m)  Weight: 178 lb 12.8 oz (81.103 kg)    GEN- The patient is well appearing, alert and oriented x 3 today.   Head- normocephalic, atraumatic Eyes-  Sclera clear, conjunctiva pink Ears- hearing intact Oropharynx- clear Neck- supple, no JVP Lymph- no cervical lymphadenopathy Lungs- Clear to ausculation bilaterally, normal work of breathing Heart- Regular rate and rhythm, no murmurs, rubs or gallops, PMI not laterally displaced GI- soft, NT, ND, + BS Extremities- no clubbing, cyanosis, or edema MS- no significant deformity or atrophy Skin- no rash or lesion Psych- euthymic mood, full affect Neuro- strength and sensation are intact  EKG- ventricular paced rhythm at 50 bpm Epic records reviewed   Assessment and Plan: 1. Permanent afib  Continue warfarin Continue  Metoprolol  2. Recent tachycardia Felt elevated heart rate was triggering angina Resolved with restart Cardizem at 120 mg a day  3. CAD Continue BB, plavix, statin, imdur, ace and sl ntg as needed  F/u Dr. [name] as recall in August    Tinisha Etzkorn C. Lavette Yankovich, [facility] [address] [place], [place] [address] [phone]

## 2015-07-13 ENCOUNTER — Other Ambulatory Visit: Payer: Self-pay | Admitting: Internal Medicine

## 2015-07-21 ENCOUNTER — Other Ambulatory Visit: Payer: Self-pay | Admitting: Interventional Cardiology

## 2015-08-08 ENCOUNTER — Ambulatory Visit (INDEPENDENT_AMBULATORY_CARE_PROVIDER_SITE_OTHER): Payer: Medicare Other | Admitting: Pharmacist

## 2015-08-08 DIAGNOSIS — I4891 Unspecified atrial fibrillation: Secondary | ICD-10-CM

## 2015-08-08 DIAGNOSIS — Z5181 Encounter for therapeutic drug level monitoring: Secondary | ICD-10-CM | POA: Diagnosis not present

## 2015-08-08 LAB — POCT INR: INR: 2.4

## 2015-08-22 ENCOUNTER — Telehealth: Payer: Self-pay | Admitting: *Deleted

## 2015-08-22 NOTE — Telephone Encounter (Signed)
Done in error.

## 2015-09-05 ENCOUNTER — Ambulatory Visit (INDEPENDENT_AMBULATORY_CARE_PROVIDER_SITE_OTHER): Payer: Medicare Other | Admitting: *Deleted

## 2015-09-05 DIAGNOSIS — Z5181 Encounter for therapeutic drug level monitoring: Secondary | ICD-10-CM | POA: Diagnosis not present

## 2015-09-05 DIAGNOSIS — I4891 Unspecified atrial fibrillation: Secondary | ICD-10-CM | POA: Diagnosis not present

## 2015-09-05 LAB — POCT INR: INR: 2.1

## 2015-09-06 DIAGNOSIS — F4323 Adjustment disorder with mixed anxiety and depressed mood: Secondary | ICD-10-CM | POA: Diagnosis not present

## 2015-09-11 ENCOUNTER — Other Ambulatory Visit: Payer: Self-pay | Admitting: Interventional Cardiology

## 2015-09-13 DIAGNOSIS — F4323 Adjustment disorder with mixed anxiety and depressed mood: Secondary | ICD-10-CM | POA: Diagnosis not present

## 2015-09-19 DIAGNOSIS — F4323 Adjustment disorder with mixed anxiety and depressed mood: Secondary | ICD-10-CM | POA: Diagnosis not present

## 2015-09-26 ENCOUNTER — Telehealth: Payer: Self-pay | Admitting: Interventional Cardiology

## 2015-09-26 NOTE — Telephone Encounter (Signed)
New message         Pt c/o of Chest Pain: STAT if CP now or developed within 24 hours  1. Are you having CP right now? no 2. Are you experiencing any other symptoms (ex. SOB, nausea, vomiting, sweating)? no  3. How long have you been experiencing CP? Last 2 nights 4. Is your CP continuous or coming and going?  Comes and goes 5. Have you taken Nitroglycerin? yes?

## 2015-09-26 NOTE — Telephone Encounter (Signed)
**Note De-identified [name] Obfuscation** LMTCB

## 2015-09-26 NOTE — Telephone Encounter (Addendum)
**Note De-Identified Patty [name] Obfuscation** The pt states that she has not had to take a NTG since mid April. She reports that on this past Saturday night she felt some chest tightness so she took 1 NTG tablet which was effective and that she had chest tightness again last night and took 1 NTG tablet which again was effective.  She states that her husband passed on 09-21-22 and that she organized a memorial service for him that she had on Sunday. She states that her chest tightness maybe due to stress of his death and handling all of the arrangements/memorial service.   I offered her an apt with Nell Range, PA-c on [name] 8/28 at 8 am. She accepted that apt. She is advised that if her chest tightness returns/worsen and 3 NTG tablets (taken 5 mins apart) is not effective to have someone drive her to the ER or to call 911.  This message has been forwarded to Dr Irish Lack and Joellen Jersey as Juluis Rainier

## 2015-09-28 ENCOUNTER — Encounter: Payer: Self-pay | Admitting: Physician Assistant

## 2015-09-28 NOTE — Progress Notes (Signed)
Cardiology Office Note    Date:  10/01/2015   ID:  Patty [name], DOB 08-04-1930, MRN [id number]  PCP:  Kandice Hams, MD  Cardiologist:  Dr. Irish Lack   CC: chest pain  History of Present Illness:  Patty [name] is a 82 y.o. female with a history of CAD s/p multiple stents, LBBB, chronic combined s/d CHF, PVD, HTN, HLD, tachy-brady s/p leadless PPM, permanent AFib/flutter on coumadin and OSA on CPAP who presents to clinic for evaluation of chest pain.  She has a significant CAD history with chronic chest pain. She has significant CAD s/p stents x 2 LAD & x 1 CFX 2000, rotational atherectomy to pCFX/pLAD & DES LAD 08/2012 chronic OM1 100%, DES CFX 11/2012, DES LAD for ISR 10/2013, DES oLAD 05/2014.  Seen in office 04/17/2015, no sig angina, afib rate controlled, coumadin therapeutic.   She was admitted to the hospital 4/13-4/15/17 for chest pain and found to be in rapid atrial flutter with a left bundle branch block. She was started on IV Cardizem. She became bradycardic with rates in 50's, so IV Cardizem was stopped, she was put on po diltiazem. Her troponin was mildly elevated at 0.26-0.40-0.37. She converted to NSR and no additional chest pain. It was felt that her elevation in troponin was in the setting of her rapid Afib.She was set up for outpatient myoview which was completed 06/01/15 and low risk but noted to be in atrial flutter.   She saw Cecilie Kicks NP in the office on 06/11/15 and she complained of chest pain associated with fluttering in her chest. She was back in aflutter. A 48 hour holter was placed to evaluate her rhythm. This showed predominantly paced rhythm with intermittent PACs and afib.   She reported one day of low blood pressure and was told to stop the Cardizem. Since stopping Cardizem has had higher v rates and more angina symptoms. NTG sl may also be contributing to lower BP's. BP is office today is 130 /80 and other home BP's have been sufficient. Leadless  pacer interrogated shows HR between a regular 100-140 for the last two weeks. Due to being leadless PPM, cannot determine atrial rhythm from interrogation. She was started back on Cardizem 120mg  daily.   She is now being followed by Roderic Palau in the afib clinic. She is now considered to have permanent aifb. Last seen 07/12/15 and was doing well.   Today she presents to clinic for evaluation of chest pain. Her husband passed recently and she has had a lot of stress surrounding this. She has has more chest pain requiring SL NTG. Since last week after all the memorial service and having all her family here she has had chest pain requiring SL NTG almost everyday. Some nights she has no had to. She has not had any dyspnea. No LE edema, orthopnea or PND. No dizziness or syncope. HRs have been in 25s and 60. She has just been really fatigued but has been through a lot emotionally.    Past Medical History:  Diagnosis Date  . Chronic anticoagulation - coumadin, CHADS2VASC=6 05/17/2015  . Combined systolic and diastolic heart failure, NYHA class 3 ([place])   . Coronary artery disease    a. s/p multiple stents  b. LHC 12/14/13 with sig CAD with TO of OM1, 70-80% occusion OM2, 50-70% mLCx, 50-70% pLAD (recent FFR 0.83), diffusely diseased RCA, widely patent stetns in pLAD and LCx.  . Diverticulitis   . Granulosa cell carcinoma ([place])  abd; last episode was in 2009  . Hyperlipidemia   . Hypertension   . LBBB (left bundle branch block)       . Myocardial infarction ([place]) 2002  . Obesity   . OSA on CPAP   . Pacemaker - St Jude Leadless PPM   . Peripheral vascular disease ([place])   . Permanent atrial fibrillation ([place]) 2013  . Renal artery stenosis ([place])   . Tachycardia-bradycardia syndrome ([place])    a. s/p leadless pacemaker ([name]) implanted by Dr [name]  . Varicose veins     Past Surgical History:  Procedure Laterality Date  . ABDOMINAL HYSTERECTOMY    . CARDIAC CATHETERIZATION  09/03/2007   EF  70%; Failed attempt at PCI to OM  . CARDIAC CATHETERIZATION  11/01/2003   EF 70%  . CARDIOVERSION  12/31/2011   Procedure: CARDIOVERSION;  Surgeon: [name], MD;  Location: [facility] ENDOSCOPY;  Service: Cardiovascular;  Laterality: N/A;  . CARDIOVERSION N/A 12/31/2011   Procedure: CARDIOVERSION;  Surgeon: [name], MD;  Location: [facility] CATH LAB;  Service: Cardiovascular;  Laterality: N/A;  . CATARACT EXTRACTION, BILATERAL  2015  . CHOLECYSTECTOMY  1980's  . COLON SURGERY  2004   colectomy for diverticulosis  . CORONARY ANGIOPLASTY WITH STENT PLACEMENT  2000; 08/11/2012; 11/12/2012   3 + 2 LAD & CFX; 2nd CFX stent 11/12/2012  . FRACTIONAL FLOW RESERVE WIRE  10/07/2013   Procedure: FRACTIONAL FLOW RESERVE WIRE;  Surgeon: [name], MD;  Location: [facility] CATH LAB;  Service: Cardiovascular;;  . granulosa tumor excision  2000; 2003; 2004; 2007   "all in my abdomen including small intestines, outside my ?uterus/etc" (08/11/2012)  . HERNIA REPAIR  2005   "laparoscopic"  . LEFT HEART CATHETERIZATION WITH CORONARY ANGIOGRAM N/A 11/12/2012   Procedure: LEFT HEART CATHETERIZATION WITH CORONARY ANGIOGRAM;  Surgeon: [name], MD;  Location: [facility] CATH LAB;  Service: Cardiovascular;  Laterality: N/A;  . LEFT HEART CATHETERIZATION WITH CORONARY ANGIOGRAM N/A 10/07/2013   Procedure: LEFT HEART CATHETERIZATION WITH CORONARY ANGIOGRAM;  Surgeon: [name], MD;  Location: [facility] CATH LAB;  Service: Cardiovascular;  Laterality: N/A;  . LEFT HEART CATHETERIZATION WITH CORONARY ANGIOGRAM N/A 12/14/2013   Procedure: LEFT HEART CATHETERIZATION WITH CORONARY ANGIOGRAM;  Surgeon: Sinclair Grooms, MD;  Location: [facility] CATH LAB;  Service: Cardiovascular;  Laterality: N/A;  . LEFT HEART CATHETERIZATION WITH CORONARY ANGIOGRAM N/A 05/16/2014   Procedure: LEFT HEART CATHETERIZATION WITH CORONARY ANGIOGRAM;  Surgeon: Sherren Mocha, MD;  Location: [facility] CATH LAB;  Service: Cardiovascular;  Laterality:  N/A;  . PERCUTANEOUS CORONARY INTERVENTION-BALLOON ONLY  08/04/2012   Procedure: PERCUTANEOUS CORONARY INTERVENTION-BALLOON ONLY;  Surgeon: [name], MD;  Location: [facility] CATH LAB;  Service: Cardiovascular;;  . PERCUTANEOUS CORONARY ROTOBLATOR INTERVENTION (PCI-R) N/A 08/11/2012   Procedure: PERCUTANEOUS CORONARY ROTOBLATOR INTERVENTION (PCI-R);  Surgeon: [name], MD;  Location: [facility] CATH LAB;  Service: Cardiovascular;  Laterality: N/A;  . PERCUTANEOUS CORONARY STENT INTERVENTION (PCI-S)  10/07/2013   Procedure: PERCUTANEOUS CORONARY STENT INTERVENTION (PCI-S);  Surgeon: [name], MD;  Location: [facility] CATH LAB;  Service: Cardiovascular;;  . PERMANENT PACEMAKER INSERTION N/A 03/16/2012   [name] (SJM) leadless pacemaker (LEADLESS II STUDY PATEINT)  . SALIVARY GLAND SURGERY  2000's   "had a little lump removed; granulosa related; it was benign" (08/11/2012)  . TEE WITHOUT CARDIOVERSION  12/31/2011   Procedure: TRANSESOPHAGEAL ECHOCARDIOGRAM (TEE);  Surgeon: [name], MD;  Location: [facility];  Service: Cardiovascular;  Laterality: N/A;  .  VARICOSE VEIN SURGERY Bilateral 1977    Current Medications: Outpatient Medications Prior to Visit  Medication Sig Dispense Refill  . atorvastatin (LIPITOR) 80 MG tablet TAKE 1 TABLET BY MOUTH EVERY MORNING 30 tablet 8  . beta carotene w/minerals (OCUVITE) tablet Take 2 tablets by mouth daily.    . Calcium Carb-Cholecalciferol (CALTRATE 600+D) 600-800 MG-UNIT TABS Take 1 tablet by mouth every morning.     . clopidogrel (PLAVIX) 75 MG tablet TAKE 1 TABLET BY MOUTH EVERY DAY WITH BREAKFAST 30 tablet 11  . Coenzyme Q-10 100 MG capsule Take 200 mg by mouth at bedtime.     Marland Kitchen diltiazem (CARDIZEM CD) 120 MG 24 hr capsule Take 1 capsule (120 mg total) by mouth daily. 30 capsule 6  . furosemide (LASIX) 40 MG tablet Take 1 tablet (40 mg total) by mouth 2 (two) times daily. 180 tablet 3  . isosorbide mononitrate (IMDUR) 120 MG 24 hr  tablet Take 1 tablet (120 mg total) by mouth daily. 30 tablet 6  . lisinopril (PRINIVIL,ZESTRIL) 5 MG tablet TAKE 1 TABLET BY MOUTH 2 TIMES DAILY 60 tablet 8  . metoprolol (LOPRESSOR) 100 MG tablet Take 1 tablet (100 mg total) by mouth 2 (two) times daily. 60 tablet 11  . Multiple Vitamin (MULTIVITAMIN) capsule Take 1 capsule by mouth daily.    Marland Kitchen NITROSTAT 0.4 MG SL tablet PLACE 1 TABLET (0.4 MG TOTAL) UNDER THE TONGUE EVERY 5 (FIVE) MINUTES AS NEEDED. FOR CHEST PAIN 25 tablet 2  . Omega-3 Fatty Acids (FISH OIL PO) Take 1,200 mg by mouth daily.     . potassium chloride SA (K-DUR,KLOR-CON) 20 MEQ tablet Take 1 tablet (20 mEq total) by mouth daily. 30 tablet 6  . spironolactone (ALDACTONE) 25 MG tablet Take 25 mg by mouth daily.  45 tablet 11  . warfarin (COUMADIN) 2 MG tablet Take 1-2 mg by mouth daily. 1mg  on Tuesdays and Fridays. 2mg  all other days.    Marland Kitchen warfarin (COUMADIN) 2 MG tablet TAKE 0.5-1 TABLETS (1-2 MG TOTAL) BY MOUTH DAILY AS DIRECTED 30 tablet 3   No facility-administered medications prior to visit.      Allergies:   Bee venom and Other   Social History   Social History  . Marital status: Married    Spouse name: N/A  . Number of children: N/A  . Years of education: N/A   Occupational History  . Retired    Social History Main Topics  . Smoking status: Former Smoker    Packs/day: 1.00    Years: 32.00    Types: Cigarettes    Quit date: 02/03/1974  . Smokeless tobacco: Never Used  . Alcohol use 3.0 oz/week    5 Glasses of wine per week     Comment: 11/12/2012 "4oz wine w/dinner 5 days/wk"  . Drug use: No  . Sexual activity: No   Other Topics Concern  . None   Social History Narrative   Lives with family.     Family History:  The patient's family history includes Diabetes in her brother and father; Heart attack in her brother and father; Hypertension in her brother and father; Kidney failure in her brother; Stroke in her mother.     ROS:   Please see the  history of present illness.    ROS All other systems reviewed and are negative.   PHYSICAL EXAM:   VS:  BP 130/68   Pulse 76   Ht 5' (1.524 m)   Wt 172 lb 1.9  oz (78.1 kg)   BMI 33.61 kg/m    GEN: Well nourished, well developed, in no acute distress  HEENT: normal  Neck: no JVD, carotid bruits, or masses Cardiac: irreg irreg no murmurs, rubs, or gallops,no edema  Respiratory:  clear to auscultation bilaterally, normal work of breathing GI: soft, nontender, nondistended, + BS MS: no deformity or atrophy  Skin: warm and dry, no rash Neuro:  Alert and Oriented x 3, Strength and sensation are intact Psych: euthymic mood, full affect  Wt Readings from Last 3 Encounters:  10/01/15 172 lb 1.9 oz (78.1 kg)  07/12/15 178 lb 12.8 oz (81.1 kg)  06/22/15 177 lb 12.8 oz (80.6 kg)      Studies/Labs Reviewed:   EKG:  EKG is ordered today.  The ekg ordered today demonstrates atrail flutter with variable block and demand pacing.  Recent Labs: 05/17/2015: B Natriuretic Peptide 129.9; Hemoglobin 13.4; Platelets 231; TSH 2.942 05/18/2015: ALT 20; BUN 28; Creatinine, Ser 1.36; Potassium 3.8; Sodium 142   Lipid Panel    Component Value Date/Time   CHOL 136 01/02/2015 0847   TRIG 67 01/02/2015 0847   HDL 62 01/02/2015 0847   CHOLHDL 2.2 01/02/2015 0847   VLDL 13 01/02/2015 0847   LDLCALC 61 01/02/2015 0847    Additional studies/ records that were reviewed today include:  05/28/15 NUC study:  There was no ST segment deviation noted during stress.  This is a low risk study.  Low risk stress nuclear study with a small, severe, partially reversible distal anterior/apical defect; findings consistent with prior small infarct and trivial peri-infarct ischemia; study not gated due to baseline atrial flutter.  2D ECHO: 05/14/2014 LV EF: 45% -  50% Study Conclusions - Left ventricle: Abnormal septal motion The cavity size was normal. Wall thickness was increased in a pattern of  moderate LVH. Systolic function was mildly reduced. The estimated ejection fraction was in the range of 45% to 50%. Diffuse hypokinesis. - Left atrium: The atrium was mildly dilated.   CATH 05/2014 Final Conclusions:   1. Severe proximal/ostial LAD stenosis treated successfully with PCI using a drug-eluting stent platform 2. Continued patency of the stented segments in the mid LAD, proximal circumflex, and RCA  3. Normal LVEDP 4. Chronic anticoagulation with warfarin in the setting of atrial fibrillation Recommendations:  'Triple Rx' with ASA 81 mg, plavix 75 mg, and warfarin 1-3 months, then warfarin/plavix ongoing. Should be ok for discharge tomorrow if no complications   ASSESSMENT & PLAN:   Chest pain: chronic history of recurring episodes of chest discomfort. She does have a significant hx of CAD with last cath in 05/2014 with PCI/DES to pLAD and continued patency of stents in mLAD, pLCz and RCA. She also has had chest pain with RVR in chronic afib. She had a recent low risk nuc in 05/2015. Chest pain has occurred every night and HRs have been stable in 50-60s. Will plan to increase imdur from 120--> 180mg  and see her back next week. We had a long discussion about her recent husband passing and the emotional stress of it all. For now she would just like to try tweaking her medicines and we will see her back next week for close follow up.   CAD: continue with Plavix, BB and atorvastatin and imdur. No ASA as she is on plavix and coumadin.   Permanant atrial flutter/fibrillation: currently on Coumadin for anticoagulation for CHADSVASC of 6 (CHF, HTN, age, vasc dz, f sex). Continue dilt CD 120mg  daily  and metoprolol 100 mg twice a day.  Tachy-brady: s/p leadless pacemaker. Functioning well. Continue follow up with Dr. [name], who she sees next week.   Hyperlipidemia: continue statin.  Essential hypertension: well controlled on current medications  Obesity: continue to encourage  weight loss.  Chronic systolic CHF:  EF has fluctuated from 45-50% 05/2014. Continue with BB and ACE, spiro and lasix. She appears euvolemic.     Medication Adjustments/Labs and Tests Ordered: Current medicines are reviewed at length with the patient today.  Concerns regarding medicines are outlined above.  Medication changes, Labs and Tests ordered today are listed in the Patient Instructions below. Patient Instructions  Medication Instructions:  Your physician has recommended you make the following change in your medication:  1.  INCREASE the Imdur 120 mg taking 1 & 1/2 tablet daily   Labwork: None ordered  Testing/Procedures: None ordered  Follow-Up: Your physician recommends that you schedule a follow-up appointment in: 10/12/15 ARRIVE AT 8:15 TO SEE KATIE Dijon Cosens, PA-c   Any Other Special Instructions Will Be Listed Below (If Applicable).    If you need a refill on your cardiac medications before your next appointment, please call your pharmacy.      Signed, Angelena Form, PA-C  10/01/2015 8:46 AM    [facility] [place], [place], [place]  [address] Phone: [phone]; Fax: [phone]

## 2015-10-01 ENCOUNTER — Ambulatory Visit (INDEPENDENT_AMBULATORY_CARE_PROVIDER_SITE_OTHER): Payer: Medicare Other | Admitting: Physician Assistant

## 2015-10-01 ENCOUNTER — Encounter: Payer: Self-pay | Admitting: Physician Assistant

## 2015-10-01 VITALS — BP 130/68 | HR 76 | Ht 60.0 in | Wt 172.1 lb

## 2015-10-01 DIAGNOSIS — I481 Persistent atrial fibrillation: Secondary | ICD-10-CM

## 2015-10-01 DIAGNOSIS — I25118 Atherosclerotic heart disease of native coronary artery with other forms of angina pectoris: Secondary | ICD-10-CM

## 2015-10-01 DIAGNOSIS — I200 Unstable angina: Secondary | ICD-10-CM

## 2015-10-01 DIAGNOSIS — I495 Sick sinus syndrome: Secondary | ICD-10-CM | POA: Diagnosis not present

## 2015-10-01 DIAGNOSIS — I110 Hypertensive heart disease with heart failure: Secondary | ICD-10-CM

## 2015-10-01 DIAGNOSIS — I5042 Chronic combined systolic (congestive) and diastolic (congestive) heart failure: Secondary | ICD-10-CM

## 2015-10-01 DIAGNOSIS — Z7901 Long term (current) use of anticoagulants: Secondary | ICD-10-CM

## 2015-10-01 DIAGNOSIS — I10 Essential (primary) hypertension: Secondary | ICD-10-CM | POA: Diagnosis not present

## 2015-10-01 DIAGNOSIS — I4819 Other persistent atrial fibrillation: Secondary | ICD-10-CM

## 2015-10-01 DIAGNOSIS — E785 Hyperlipidemia, unspecified: Secondary | ICD-10-CM

## 2015-10-01 NOTE — Patient Instructions (Addendum)
Medication Instructions:  Your physician has recommended you make the following change in your medication:  1.  INCREASE the Imdur 120 mg taking 1 & 1/2 tablet daily   Labwork: None ordered  Testing/Procedures: None ordered  Follow-Up: Your physician recommends that you schedule a follow-up appointment in: 10/12/15 ARRIVE AT 8:15 TO SEE KATIE THOMPSON, PA-c   Any Other Special Instructions Will Be Listed Below (If Applicable).    If you need a refill on your cardiac medications before your next appointment, please call your pharmacy.

## 2015-10-03 DIAGNOSIS — H26492 Other secondary cataract, left eye: Secondary | ICD-10-CM | POA: Diagnosis not present

## 2015-10-03 DIAGNOSIS — H353131 Nonexudative age-related macular degeneration, bilateral, early dry stage: Secondary | ICD-10-CM | POA: Diagnosis not present

## 2015-10-10 ENCOUNTER — Ambulatory Visit (INDEPENDENT_AMBULATORY_CARE_PROVIDER_SITE_OTHER): Payer: Medicare Other | Admitting: *Deleted

## 2015-10-10 ENCOUNTER — Ambulatory Visit (INDEPENDENT_AMBULATORY_CARE_PROVIDER_SITE_OTHER): Payer: Medicare Other | Admitting: Internal Medicine

## 2015-10-10 ENCOUNTER — Encounter: Payer: Self-pay | Admitting: Internal Medicine

## 2015-10-10 VITALS — BP 114/60 | HR 65 | Ht 60.0 in | Wt 175.2 lb

## 2015-10-10 DIAGNOSIS — I4891 Unspecified atrial fibrillation: Secondary | ICD-10-CM | POA: Diagnosis not present

## 2015-10-10 DIAGNOSIS — I200 Unstable angina: Secondary | ICD-10-CM | POA: Diagnosis not present

## 2015-10-10 DIAGNOSIS — I495 Sick sinus syndrome: Secondary | ICD-10-CM | POA: Diagnosis not present

## 2015-10-10 DIAGNOSIS — I4819 Other persistent atrial fibrillation: Secondary | ICD-10-CM

## 2015-10-10 DIAGNOSIS — I10 Essential (primary) hypertension: Secondary | ICD-10-CM

## 2015-10-10 DIAGNOSIS — Z5181 Encounter for therapeutic drug level monitoring: Secondary | ICD-10-CM

## 2015-10-10 DIAGNOSIS — I481 Persistent atrial fibrillation: Secondary | ICD-10-CM

## 2015-10-10 LAB — CUP PACEART INCLINIC DEVICE CHECK
Battery Remaining Longevity: 108 mo
Date Time Interrogation Session: 20170906165324
Lead Channel Impedance Value: 326 Ohm
Lead Channel Pacing Threshold Amplitude: 0.5 V
MDC IDC MSMT LEADCHNL RV PACING THRESHOLD PULSEWIDTH: 0.4 ms
MDC IDC MSMT LEADCHNL RV SENSING INTR AMPL: 6 mV
MDC IDC PG SERIAL: 1096

## 2015-10-10 LAB — POCT INR: INR: 2.7

## 2015-10-10 NOTE — Patient Instructions (Addendum)
Medication Instructions:  Your physician recommends that you continue on your current medications as directed. Please refer to the Current Medication list given to you today.   Labwork: None ordered   Testing/Procedures: None ordered   Follow-Up: Your physician recommends that you schedule a follow-up appointment in: 3 months with Dr Irish Lack     Your physician wants you to follow-up in: 6 months with Chanetta Marshall, NP and 12 months with Dr Vallery Ridge will receive a reminder letter in the mail two months in advance. If you don't receive a letter, please call our office to schedule the follow-up appointment.   Any Other Special Instructions Will Be Listed Below (If Applicable).     If you need a refill on your cardiac medications before your next appointment, please call your pharmacy.

## 2015-10-10 NOTE — Progress Notes (Signed)
Electrophysiology Office Note   Date:  10/10/2015   ID:  Patty [name], DOB Jun 12, 1930, MRN [id number]  Cardiologist:  Dr Irish Lack Primary Electrophysiologist: Thompson Grayer, MD    Chief Complaint  Patient presents with  . Atrial Fibrillation     History of Present Illness: Patty [name] is a 82 y.o. female who presents today for electrophysiology evaluation.   She presents today for device follow-up.  She is mostly unaware of her afib.  When she has elevated V rates, she has angina typically.  She continues to grieve the death of her spouse. She was seen by L Dorene Ar 5/17 and her diltiazem was reduced.  With elevated V rates, she has noticed more angina.  She was more recently seen by Kathlene November and imdur was increased.  She has done better.  Morning hypotension limits her medicine titration currently.  Today, she denies symptoms of palpitations,  shortness of breath, orthopnea, PND, lower extremity edema, claudication, dizziness, presyncope, syncope, bleeding, or neurologic sequela. The patient is tolerating medications without difficulties and is otherwise without complaint today.    Past Medical History:  Diagnosis Date  . Chronic anticoagulation - coumadin, CHADS2VASC=6 05/17/2015  . Combined systolic and diastolic heart failure, NYHA class 3 ([place])   . Coronary artery disease    a. s/p multiple stents  b. LHC 12/14/13 with sig CAD with TO of OM1, 70-80% occusion OM2, 50-70% mLCx, 50-70% pLAD (recent FFR 0.83), diffusely diseased RCA, widely patent stetns in pLAD and LCx.  . Diverticulitis   . Granulosa cell carcinoma ([place])    abd; last episode was in 2009  . Hyperlipidemia   . Hypertension   . LBBB (left bundle branch block)       . Myocardial infarction ([place]) 2002  . Obesity   . OSA on CPAP   . Pacemaker - St Jude Leadless PPM   . Peripheral vascular disease ([place])   . Permanent atrial fibrillation ([place]) 2013  . Renal artery stenosis ([place])   . Tachycardia-bradycardia  syndrome ([place])    a. s/p leadless pacemaker ([name]) implanted by Dr [name]  . Varicose veins    Past Surgical History:  Procedure Laterality Date  . ABDOMINAL HYSTERECTOMY    . CARDIAC CATHETERIZATION  09/03/2007   EF 70%; Failed attempt at PCI to OM  . CARDIAC CATHETERIZATION  11/01/2003   EF 70%  . CARDIOVERSION  12/31/2011   Procedure: CARDIOVERSION;  Surgeon: [name], MD;  Location: [facility] ENDOSCOPY;  Service: Cardiovascular;  Laterality: N/A;  . CARDIOVERSION N/A 12/31/2011   Procedure: CARDIOVERSION;  Surgeon: [name], MD;  Location: [facility] CATH LAB;  Service: Cardiovascular;  Laterality: N/A;  . CATARACT EXTRACTION, BILATERAL  2015  . CHOLECYSTECTOMY  1980's  . COLON SURGERY  2004   colectomy for diverticulosis  . CORONARY ANGIOPLASTY WITH STENT PLACEMENT  2000; 08/11/2012; 11/12/2012   3 + 2 LAD & CFX; 2nd CFX stent 11/12/2012  . FRACTIONAL FLOW RESERVE WIRE  10/07/2013   Procedure: FRACTIONAL FLOW RESERVE WIRE;  Surgeon: [name], MD;  Location: [facility] CATH LAB;  Service: Cardiovascular;;  . granulosa tumor excision  2000; 2003; 2004; 2007   "all in my abdomen including small intestines, outside my ?uterus/etc" (08/11/2012)  . HERNIA REPAIR  2005   "laparoscopic"  . LEFT HEART CATHETERIZATION WITH CORONARY ANGIOGRAM N/A 11/12/2012   Procedure: LEFT HEART CATHETERIZATION WITH CORONARY ANGIOGRAM;  Surgeon: [name], MD;  Location: [facility] CATH LAB;  Service: Cardiovascular;  Laterality: N/A;  . LEFT HEART CATHETERIZATION WITH CORONARY ANGIOGRAM N/A 10/07/2013   Procedure: LEFT HEART CATHETERIZATION WITH CORONARY ANGIOGRAM;  Surgeon: [name], MD;  Location: [facility] CATH LAB;  Service: Cardiovascular;  Laterality: N/A;  . LEFT HEART CATHETERIZATION WITH CORONARY ANGIOGRAM N/A 12/14/2013   Procedure: LEFT HEART CATHETERIZATION WITH CORONARY ANGIOGRAM;  Surgeon: Sinclair Grooms, MD;  Location: [facility] CATH LAB;  Service: Cardiovascular;  Laterality: N/A;    . LEFT HEART CATHETERIZATION WITH CORONARY ANGIOGRAM N/A 05/16/2014   Procedure: LEFT HEART CATHETERIZATION WITH CORONARY ANGIOGRAM;  Surgeon: Sherren Mocha, MD;  Location: [facility] CATH LAB;  Service: Cardiovascular;  Laterality: N/A;  . PERCUTANEOUS CORONARY INTERVENTION-BALLOON ONLY  08/04/2012   Procedure: PERCUTANEOUS CORONARY INTERVENTION-BALLOON ONLY;  Surgeon: [name], MD;  Location: [facility] CATH LAB;  Service: Cardiovascular;;  . PERCUTANEOUS CORONARY ROTOBLATOR INTERVENTION (PCI-R) N/A 08/11/2012   Procedure: PERCUTANEOUS CORONARY ROTOBLATOR INTERVENTION (PCI-R);  Surgeon: [name], MD;  Location: [facility] CATH LAB;  Service: Cardiovascular;  Laterality: N/A;  . PERCUTANEOUS CORONARY STENT INTERVENTION (PCI-S)  10/07/2013   Procedure: PERCUTANEOUS CORONARY STENT INTERVENTION (PCI-S);  Surgeon: [name], MD;  Location: [facility] CATH LAB;  Service: Cardiovascular;;  . PERMANENT PACEMAKER INSERTION N/A 03/16/2012   [name] (SJM) leadless pacemaker (LEADLESS II STUDY PATEINT)  . SALIVARY GLAND SURGERY  2000's   "had a little lump removed; granulosa related; it was benign" (08/11/2012)  . TEE WITHOUT CARDIOVERSION  12/31/2011   Procedure: TRANSESOPHAGEAL ECHOCARDIOGRAM (TEE);  Surgeon: [name], MD;  Location: [place];  Service: Cardiovascular;  Laterality: N/A;  . VARICOSE VEIN SURGERY Bilateral 1977     Current Outpatient Prescriptions  Medication Sig Dispense Refill  . atorvastatin (LIPITOR) 80 MG tablet TAKE 1 TABLET BY MOUTH EVERY MORNING 30 tablet 8  . beta carotene w/minerals (OCUVITE) tablet Take 2 tablets by mouth daily.    . Calcium Carb-Cholecalciferol (CALTRATE 600+D) 600-800 MG-UNIT TABS Take 1 tablet by mouth every morning.     . clopidogrel (PLAVIX) 75 MG tablet TAKE 1 TABLET BY MOUTH EVERY DAY WITH BREAKFAST 30 tablet 11  . Coenzyme Q-10 100 MG capsule Take 200 mg by mouth at bedtime.     Marland Kitchen diltiazem (CARDIZEM CD) 120 MG 24 hr capsule Take 1 capsule  (120 mg total) by mouth daily. 30 capsule 6  . furosemide (LASIX) 40 MG tablet Take 1 tablet (40 mg total) by mouth 2 (two) times daily. 180 tablet 3  . isosorbide mononitrate (IMDUR) 120 MG 24 hr tablet Take 1.5 tablet by mouth daily    . lisinopril (PRINIVIL,ZESTRIL) 5 MG tablet TAKE 1 TABLET BY MOUTH 2 TIMES DAILY 60 tablet 8  . metoprolol (LOPRESSOR) 100 MG tablet Take 1 tablet (100 mg total) by mouth 2 (two) times daily. 60 tablet 11  . Multiple Vitamin (MULTIVITAMIN) capsule Take 1 capsule by mouth daily.    Marland Kitchen NITROSTAT 0.4 MG SL tablet PLACE 1 TABLET (0.4 MG TOTAL) UNDER THE TONGUE EVERY 5 (FIVE) MINUTES AS NEEDED. FOR CHEST PAIN 25 tablet 2  . Omega-3 Fatty Acids (FISH OIL PO) Take 1,200 mg by mouth daily.     . potassium chloride SA (K-DUR,KLOR-CON) 20 MEQ tablet Take 1 tablet (20 mEq total) by mouth daily. 30 tablet 6  . spironolactone (ALDACTONE) 25 MG tablet Take 25 mg by mouth daily.  45 tablet 11  . warfarin (COUMADIN) 2 MG tablet Take 1-2 mg by mouth daily. 1mg  on Tuesdays and Fridays. 2mg  all other days.    Marland Kitchen  warfarin (COUMADIN) 2 MG tablet TAKE 0.5-1 TABLETS (1-2 MG TOTAL) BY MOUTH DAILY AS DIRECTED 30 tablet 3   No current facility-administered medications for this visit.     Allergies:   Bee venom and Other   Social History:  The patient  reports that she quit smoking about 41 years ago. Her smoking use included Cigarettes. She has a 32.00 pack-year smoking history. She has never used smokeless tobacco. She reports that she drinks about 3.0 oz of alcohol per week . She reports that she does not use drugs.   Family History:  The patient's family history includes Diabetes in her brother and father; Heart attack in her brother and father; Hypertension in her brother and father; Kidney failure in her brother; Stroke in her mother.    ROS:  Please see the history of present illness.   All other systems are reviewed and negative.    PHYSICAL EXAM: VS:  BP 114/60   Pulse 65    Ht 5' (1.524 m)   Wt 175 lb 3.2 oz (79.5 kg)   SpO2 97%   BMI 34.22 kg/m  , BMI Body mass index is 34.22 kg/m. GEN: Well nourished, well developed, in no acute distress  HEENT: normal  Neck: no JVD, carotid bruits, or masses Cardiac: RRR (paced) Respiratory:  clear to auscultation bilaterally, normal work of breathing GI: soft, nontender, nondistended, + BS MS: no deformity or atrophy  Skin: warm and dry  Neuro:  Strength and sensation are intact Psych: euthymic mood, full affect  EKG:  EKG is ordered today. The ekg ordered today shows atypical atrial flutter, V rates 56, IVCD  Device interrogation is reviewed today in detail.  See PaceArt for details.   Recent Labs: 05/17/2015: B Natriuretic Peptide 129.9; Hemoglobin 13.4; Platelets 231; TSH 2.942 05/18/2015: ALT 20; BUN 28; Creatinine, Ser 1.36; Potassium 3.8; Sodium 142    Lipid Panel     Component Value Date/Time   CHOL 136 01/02/2015 0847   TRIG 67 01/02/2015 0847   HDL 62 01/02/2015 0847   CHOLHDL 2.2 01/02/2015 0847   VLDL 13 01/02/2015 0847   LDLCALC 61 01/02/2015 0847     Wt Readings from Last 3 Encounters:  10/10/15 175 lb 3.2 oz (79.5 kg)  10/01/15 172 lb 1.9 oz (78.1 kg)  07/12/15 178 lb 12.8 oz (81.1 kg)      Other studies Reviewed: Additional studies/ records that were reviewed today include: multiple recent APP visits reviewed   ASSESSMENT AND PLAN:  1.  Persistent afib/ atypical atrial flutter Appropriately anticoagulated for chads2vasc score of at least 5 She does better with aggressive rate control.  2. Bradycardia Normal pacemaker function See Pace Art report No changes today  3. CAD  Improved with imdur If she has further angina, I would favor reducing lisinopril and increase diltiazem for better rate control  4. HTN Stable No change required today   Current medicines are reviewed at length with the patient today.   The patient does not have concerns regarding her medicines.   The following changes were made today:  none  Follow-up with Dr Irish Lack in 3 months Follow-up with EP NP in 6 months,  I will see in a year  Signed, Thompson Grayer, MD  10/10/2015 2:59 PM     [facility] [address] [place] [address] [phone] (office) [phone] (fax)

## 2015-10-12 ENCOUNTER — Ambulatory Visit: Payer: Medicare Other | Admitting: Physician Assistant

## 2015-10-17 ENCOUNTER — Other Ambulatory Visit: Payer: Self-pay | Admitting: *Deleted

## 2015-10-17 DIAGNOSIS — H52223 Regular astigmatism, bilateral: Secondary | ICD-10-CM | POA: Diagnosis not present

## 2015-10-17 DIAGNOSIS — H5203 Hypermetropia, bilateral: Secondary | ICD-10-CM | POA: Diagnosis not present

## 2015-10-17 DIAGNOSIS — H524 Presbyopia: Secondary | ICD-10-CM | POA: Diagnosis not present

## 2015-10-17 DIAGNOSIS — H02403 Unspecified ptosis of bilateral eyelids: Secondary | ICD-10-CM | POA: Diagnosis not present

## 2015-10-17 MED ORDER — ISOSORBIDE MONONITRATE ER 120 MG PO TB24: ORAL_TABLET | ORAL | 3 refills | Status: DC

## 2015-11-14 ENCOUNTER — Other Ambulatory Visit: Payer: Self-pay | Admitting: Interventional Cardiology

## 2015-11-15 DIAGNOSIS — F4323 Adjustment disorder with mixed anxiety and depressed mood: Secondary | ICD-10-CM | POA: Diagnosis not present

## 2015-11-21 ENCOUNTER — Telehealth: Payer: Self-pay

## 2015-11-21 ENCOUNTER — Other Ambulatory Visit: Payer: Self-pay | Admitting: Internal Medicine

## 2015-11-21 DIAGNOSIS — Z1231 Encounter for screening mammogram for malignant neoplasm of breast: Secondary | ICD-10-CM

## 2015-11-21 DIAGNOSIS — D3910 Neoplasm of uncertain behavior of unspecified ovary: Secondary | ICD-10-CM

## 2015-11-21 NOTE — Telephone Encounter (Signed)
Spoke with Patty [name] as she called earlier and left a message that she needed to schedule an appointment with Dr. Marko Plume. Scheduled appointment for Patty Mangino on 12-20-15 at 1330 for lab and 1400 for visit.

## 2015-11-22 ENCOUNTER — Ambulatory Visit (INDEPENDENT_AMBULATORY_CARE_PROVIDER_SITE_OTHER): Payer: Medicare Other | Admitting: Pharmacist

## 2015-11-22 DIAGNOSIS — F4323 Adjustment disorder with mixed anxiety and depressed mood: Secondary | ICD-10-CM | POA: Diagnosis not present

## 2015-11-22 DIAGNOSIS — I4891 Unspecified atrial fibrillation: Secondary | ICD-10-CM

## 2015-11-22 DIAGNOSIS — Z5181 Encounter for therapeutic drug level monitoring: Secondary | ICD-10-CM | POA: Diagnosis not present

## 2015-11-22 LAB — POCT INR: INR: 3.1

## 2015-11-29 ENCOUNTER — Other Ambulatory Visit: Payer: Self-pay | Admitting: Internal Medicine

## 2015-11-29 ENCOUNTER — Other Ambulatory Visit: Payer: Self-pay | Admitting: Interventional Cardiology

## 2015-11-29 DIAGNOSIS — F4323 Adjustment disorder with mixed anxiety and depressed mood: Secondary | ICD-10-CM | POA: Diagnosis not present

## 2015-11-30 DIAGNOSIS — K130 Diseases of lips: Secondary | ICD-10-CM | POA: Diagnosis not present

## 2015-11-30 DIAGNOSIS — R350 Frequency of micturition: Secondary | ICD-10-CM | POA: Diagnosis not present

## 2015-11-30 DIAGNOSIS — Z7901 Long term (current) use of anticoagulants: Secondary | ICD-10-CM | POA: Diagnosis not present

## 2015-11-30 DIAGNOSIS — I10 Essential (primary) hypertension: Secondary | ICD-10-CM | POA: Diagnosis not present

## 2015-12-03 DIAGNOSIS — H02834 Dermatochalasis of left upper eyelid: Secondary | ICD-10-CM | POA: Diagnosis not present

## 2015-12-03 DIAGNOSIS — H02831 Dermatochalasis of right upper eyelid: Secondary | ICD-10-CM | POA: Diagnosis not present

## 2015-12-05 ENCOUNTER — Telehealth: Payer: Self-pay | Admitting: Interventional Cardiology

## 2015-12-05 NOTE — Telephone Encounter (Signed)
Patty [name] is calling because her heart rate has been quite high over the last week it has been up in the 120's range and the systolic part of her bp has been down to 77 . Please call   Thanks

## 2015-12-05 NOTE — Telephone Encounter (Signed)
spoke with patient and she was doing okay just concerned about her HR.  Discussed with Dr [name] and will have her seen by Roderic Palau, NP in [place] clinic tomorrow at 3pm.  Patient aware and gave the code for Nov [phone].

## 2015-12-05 NOTE — Telephone Encounter (Signed)
**Note De-Identified [place] Obfuscation** Will forward message to Dr [name] and his nurse, Claiborne Billings, as the pt sees Dr [name] for a-fib and tachy-brady syndrome.

## 2015-12-06 ENCOUNTER — Ambulatory Visit (HOSPITAL_COMMUNITY)
Admission: RE | Admit: 2015-12-06 | Discharge: 2015-12-06 | Disposition: A | Discharge status: Home / Self Care | Payer: Medicare Other | Source: Ambulatory Visit | Attending: Nurse Practitioner | Admitting: Nurse Practitioner

## 2015-12-06 ENCOUNTER — Encounter (HOSPITAL_COMMUNITY): Payer: Self-pay | Admitting: Nurse Practitioner

## 2015-12-06 VITALS — BP 108/68 | HR 109 | Ht 60.0 in | Wt 173.6 lb

## 2015-12-06 DIAGNOSIS — I2510 Atherosclerotic heart disease of native coronary artery without angina pectoris: Secondary | ICD-10-CM | POA: Diagnosis not present

## 2015-12-06 DIAGNOSIS — I4821 Permanent atrial fibrillation: Secondary | ICD-10-CM

## 2015-12-06 DIAGNOSIS — I110 Hypertensive heart disease with heart failure: Secondary | ICD-10-CM | POA: Diagnosis not present

## 2015-12-06 DIAGNOSIS — R000 Tachycardia, unspecified: Secondary | ICD-10-CM | POA: Insufficient documentation

## 2015-12-06 DIAGNOSIS — E669 Obesity, unspecified: Secondary | ICD-10-CM | POA: Diagnosis not present

## 2015-12-06 DIAGNOSIS — Z7901 Long term (current) use of anticoagulants: Secondary | ICD-10-CM | POA: Insufficient documentation

## 2015-12-06 DIAGNOSIS — I447 Left bundle-branch block, unspecified: Secondary | ICD-10-CM | POA: Diagnosis not present

## 2015-12-06 DIAGNOSIS — Z955 Presence of coronary angioplasty implant and graft: Secondary | ICD-10-CM | POA: Insufficient documentation

## 2015-12-06 DIAGNOSIS — E785 Hyperlipidemia, unspecified: Secondary | ICD-10-CM | POA: Insufficient documentation

## 2015-12-06 DIAGNOSIS — I701 Atherosclerosis of renal artery: Secondary | ICD-10-CM | POA: Diagnosis not present

## 2015-12-06 DIAGNOSIS — G4733 Obstructive sleep apnea (adult) (pediatric): Secondary | ICD-10-CM | POA: Diagnosis not present

## 2015-12-06 DIAGNOSIS — I482 Chronic atrial fibrillation: Secondary | ICD-10-CM | POA: Diagnosis not present

## 2015-12-06 DIAGNOSIS — Z950 Presence of cardiac pacemaker: Secondary | ICD-10-CM | POA: Diagnosis not present

## 2015-12-06 DIAGNOSIS — I219 Acute myocardial infarction, unspecified: Secondary | ICD-10-CM | POA: Insufficient documentation

## 2015-12-06 DIAGNOSIS — I4892 Unspecified atrial flutter: Secondary | ICD-10-CM | POA: Diagnosis not present

## 2015-12-06 DIAGNOSIS — I739 Peripheral vascular disease, unspecified: Secondary | ICD-10-CM | POA: Insufficient documentation

## 2015-12-06 DIAGNOSIS — Z87891 Personal history of nicotine dependence: Secondary | ICD-10-CM | POA: Insufficient documentation

## 2015-12-06 DIAGNOSIS — I5042 Chronic combined systolic (congestive) and diastolic (congestive) heart failure: Secondary | ICD-10-CM | POA: Diagnosis not present

## 2015-12-06 MED ORDER — ISOSORBIDE MONONITRATE ER 120 MG PO TB24: 120.0000 mg | ORAL_TABLET | Freq: Every day | ORAL | 3 refills | Status: DC

## 2015-12-06 MED ORDER — DILTIAZEM HCL 30 MG PO TABS: ORAL_TABLET | ORAL | 3 refills | Status: DC

## 2015-12-06 NOTE — Patient Instructions (Signed)
Your physician has recommended you make the following change in your medication:  1)Decrease imdur back down to 120mg  once a day 2)Cardizem 30mg  -- take 1 tablet every 4 hours AS NEEDED for heart rate >100 as long as blood pressure >100.

## 2015-12-07 DIAGNOSIS — I481 Persistent atrial fibrillation: Secondary | ICD-10-CM | POA: Diagnosis not present

## 2015-12-07 DIAGNOSIS — E7800 Pure hypercholesterolemia, unspecified: Secondary | ICD-10-CM | POA: Diagnosis not present

## 2015-12-07 DIAGNOSIS — I2510 Atherosclerotic heart disease of native coronary artery without angina pectoris: Secondary | ICD-10-CM | POA: Diagnosis not present

## 2015-12-07 DIAGNOSIS — G4733 Obstructive sleep apnea (adult) (pediatric): Secondary | ICD-10-CM | POA: Diagnosis not present

## 2015-12-07 DIAGNOSIS — I10 Essential (primary) hypertension: Secondary | ICD-10-CM | POA: Diagnosis not present

## 2015-12-07 NOTE — Progress Notes (Signed)
Patient ID: Patty [name], female   DOB: 1930/08/17, 82 y.o.   MRN: [id number]    Primary Care Physician: Kandice Hams, MD Referring Physician: Dr. Aurea Graff E Shaddock is a 82 y.o. female with a h/o permanent afib on coumadin, St Jude leadless PPM for junctional rhythm, CAD stents x 2 LAD & x 1 CFX 2000, rotational atherectomy to pCFX/pLAD & DES LAD 08/2012 chronic OM1 100%, DES CFX 11/2012, DES LAD for ISR 10/2013, DES oLAD [id number], diastolic CHF and HTN.   On 05/17/15 at approximately 7:30 AM, she was wakened from sleep by chest pain, her usual angina. She took nitroglycerin with improvement but had to keep taking them because the symptoms did not resolve. She was a little short of breath with this. She took her usual dose of Lasix and her clopidogrel, and got nauseated so did not take any additional medications. She did not vomit. She did not have any diaphoresis. When the third dose of nitroglycerin did not relieve her pain, she felt she needed to be seen and came to the emergency room.   In the emergency room, she was in rapid atrial flutter with a left bundle branch block. She was started on IV Cardizem. She became bradycardic with rates in 50's, so IV Cardizem was stopped, she was put on po diltiazem. Her troponin was mildly elevated at 0.26-0.40-0.37. She converted to NSR and no additional chest pain. It was felt that her elevation in troponin was in the setting of her rapid Afib.   She ambulated in hallways without angina and SOB. She had outpatient Lexiscan Myoview. low risk nuc study --she was noted to be in a flutter on nuc study.D/c home with Cardizem increased to 240 mg a day from 180 mg a day.   She was seen by Cecilie Kicks, NP 5/8, with 2 episodes of chest pain and fluttering for which she took a NTG with relief, HR more elevated over last few days, questioning aflutter or atach. 2 day Holter monitor was ordered but malfunctioned and will be pending another monitor 5/30.     She reported one day of low blood pressure and was told to stop the Cardizem. Since stopping Cardizem has had higher v rates and more angina symptoms. NTG sl may also be contributing to lower BP's. BP is office today is 130 /80 and other home BP's have been sufficient. Leadless pacer interrogated shows HR between a regular 100-140 for the last two weeks. Due to being leadless PPM, cannot determine atrial rhythm from interrogation.  Returns 6/8 and reports that she has felt great since she restarted the Cardizem. No further episodes of fast heart rate or angina. Her husband was sick and had to be hospitalized several weeks ago and she has had to be more available to care for him. This has cut into her gym time and she feels her exercise tolerance is slipping. She hopes to get some in home help for her husband so she can have more free time to get to the gym.  Seeing pt back in the afib clinic, 11-2, for elevated v rates associated with permanent afib and low BP over the last week. When I saw her in the spring she was having higher v rates which was triggering angina and that appears to be the case now. She has been taking SL NTG which will help the discomfort but drops the BP and still has the high v rates. She had an increase in her  Imdur from 120 mg to 180 mg qd a couple of months back but does not think significantly changed her symptoms. She has taken about 6 SL NTG over the last week. She lost her husband in July.  Today, she denies symptoms of   shortness of breath, orthopnea, PND, lower extremity edema, dizziness, presyncope, syncope, or neurologic sequela. Positive for intermittent palpitations and chest pain. The patient is tolerating medications without difficulties and is otherwise without complaint today.   Past Medical History:  Diagnosis Date  . Chronic anticoagulation - coumadin, CHADS2VASC=6 05/17/2015  . Combined systolic and diastolic heart failure, NYHA class 3 ([place])   . Coronary  artery disease    a. s/p multiple stents  b. LHC 12/14/13 with sig CAD with TO of OM1, 70-80% occusion OM2, 50-70% mLCx, 50-70% pLAD (recent FFR 0.83), diffusely diseased RCA, widely patent stetns in pLAD and LCx.  . Diverticulitis   . Granulosa cell carcinoma ([place])    abd; last episode was in 2009  . Hyperlipidemia   . Hypertension   . LBBB (left bundle branch block)       . Myocardial infarction 2002  . Obesity   . OSA on CPAP   . Pacemaker - St Jude Leadless PPM   . Peripheral vascular disease ([place])   . Permanent atrial fibrillation ([place]) 2013  . Renal artery stenosis ([place])   . Tachycardia-bradycardia syndrome ([place])    a. s/p leadless pacemaker ([name]) implanted by Dr [name]  . Varicose veins    Past Surgical History:  Procedure Laterality Date  . ABDOMINAL HYSTERECTOMY    . CARDIAC CATHETERIZATION  09/03/2007   EF 70%; Failed attempt at PCI to OM  . CARDIAC CATHETERIZATION  11/01/2003   EF 70%  . CARDIOVERSION  12/31/2011   Procedure: CARDIOVERSION;  Surgeon: [name], MD;  Location: [facility] ENDOSCOPY;  Service: Cardiovascular;  Laterality: N/A;  . CARDIOVERSION N/A 12/31/2011   Procedure: CARDIOVERSION;  Surgeon: [name], MD;  Location: [facility] CATH LAB;  Service: Cardiovascular;  Laterality: N/A;  . CATARACT EXTRACTION, BILATERAL  2015  . CHOLECYSTECTOMY  1980's  . COLON SURGERY  2004   colectomy for diverticulosis  . CORONARY ANGIOPLASTY WITH STENT PLACEMENT  2000; 08/11/2012; 11/12/2012   3 + 2 LAD & CFX; 2nd CFX stent 11/12/2012  . FRACTIONAL FLOW RESERVE WIRE  10/07/2013   Procedure: FRACTIONAL FLOW RESERVE WIRE;  Surgeon: [name], MD;  Location: [facility] CATH LAB;  Service: Cardiovascular;;  . granulosa tumor excision  2000; 2003; 2004; 2007   "all in my abdomen including small intestines, outside my ?uterus/etc" (08/11/2012)  . HERNIA REPAIR  2005   "laparoscopic"  . LEFT HEART CATHETERIZATION WITH CORONARY ANGIOGRAM N/A 11/12/2012   Procedure:  LEFT HEART CATHETERIZATION WITH CORONARY ANGIOGRAM;  Surgeon: [name], MD;  Location: [facility] CATH LAB;  Service: Cardiovascular;  Laterality: N/A;  . LEFT HEART CATHETERIZATION WITH CORONARY ANGIOGRAM N/A 10/07/2013   Procedure: LEFT HEART CATHETERIZATION WITH CORONARY ANGIOGRAM;  Surgeon: [name], MD;  Location: [facility] CATH LAB;  Service: Cardiovascular;  Laterality: N/A;  . LEFT HEART CATHETERIZATION WITH CORONARY ANGIOGRAM N/A 12/14/2013   Procedure: LEFT HEART CATHETERIZATION WITH CORONARY ANGIOGRAM;  Surgeon: Sinclair Grooms, MD;  Location: [facility] CATH LAB;  Service: Cardiovascular;  Laterality: N/A;  . LEFT HEART CATHETERIZATION WITH CORONARY ANGIOGRAM N/A 05/16/2014   Procedure: LEFT HEART CATHETERIZATION WITH CORONARY ANGIOGRAM;  Surgeon: Sherren Mocha, MD;  Location: [facility] CATH LAB;  Service: Cardiovascular;  Laterality: N/A;  . PERCUTANEOUS CORONARY INTERVENTION-BALLOON ONLY  08/04/2012   Procedure: PERCUTANEOUS CORONARY INTERVENTION-BALLOON ONLY;  Surgeon: [name], MD;  Location: [facility] CATH LAB;  Service: Cardiovascular;;  . PERCUTANEOUS CORONARY ROTOBLATOR INTERVENTION (PCI-R) N/A 08/11/2012   Procedure: PERCUTANEOUS CORONARY ROTOBLATOR INTERVENTION (PCI-R);  Surgeon: [name], MD;  Location: [facility] CATH LAB;  Service: Cardiovascular;  Laterality: N/A;  . PERCUTANEOUS CORONARY STENT INTERVENTION (PCI-S)  10/07/2013   Procedure: PERCUTANEOUS CORONARY STENT INTERVENTION (PCI-S);  Surgeon: [name], MD;  Location: [facility] CATH LAB;  Service: Cardiovascular;;  . PERMANENT PACEMAKER INSERTION N/A 03/16/2012   [name] (SJM) leadless pacemaker (LEADLESS II STUDY PATEINT)  . SALIVARY GLAND SURGERY  2000's   "had a little lump removed; granulosa related; it was benign" (08/11/2012)  . TEE WITHOUT CARDIOVERSION  12/31/2011   Procedure: TRANSESOPHAGEAL ECHOCARDIOGRAM (TEE);  Surgeon: [name], MD;  Location: [place];  Service: Cardiovascular;  Laterality:  N/A;  . VARICOSE VEIN SURGERY Bilateral 1977    Current Outpatient Prescriptions  Medication Sig Dispense Refill  . atorvastatin (LIPITOR) 80 MG tablet TAKE 1 TABLET BY MOUTH EVERY MORNING 30 tablet 8  . beta carotene w/minerals (OCUVITE) tablet Take 2 tablets by mouth daily.    . Calcium Carb-Cholecalciferol (CALTRATE 600+D) 600-800 MG-UNIT TABS Take 1 tablet by mouth every morning.     . clopidogrel (PLAVIX) 75 MG tablet TAKE 1 TABLET BY MOUTH EVERY DAY WITH BREAKFAST 30 tablet 11  . Coenzyme Q-10 100 MG capsule Take 200 mg by mouth at bedtime.     Marland Kitchen diltiazem (CARDIZEM CD) 120 MG 24 hr capsule Take 1 capsule (120 mg total) by mouth daily. 30 capsule 6  . furosemide (LASIX) 40 MG tablet Take 80 mg by mouth daily.    . isosorbide mononitrate (IMDUR) 120 MG 24 hr tablet Take 1 tablet (120 mg total) by mouth daily. 135 tablet 3  . KLOR-CON M20 20 MEQ tablet TAKE 1 TABLET (20 MEQ TOTAL) BY MOUTH DAILY. 30 tablet 9  . lisinopril (PRINIVIL,ZESTRIL) 5 MG tablet TAKE 1 TABLET BY MOUTH 2 TIMES DAILY 60 tablet 8  . metoprolol (LOPRESSOR) 100 MG tablet TAKE 1 TABLET (100 MG TOTAL) BY MOUTH 2 (TWO) TIMES DAILY. 60 tablet 11  . Multiple Vitamin (MULTIVITAMIN) capsule Take 1 capsule by mouth daily.    . nitroGLYCERIN (NITROSTAT) 0.4 MG SL tablet PLACE 1 TABLET (0.4 MG TOTAL) UNDER THE TONGUE EVERY 5 (FIVE) MINUTES AS NEEDED. FOR CHEST PAIN 25 tablet 3  . Omega-3 Fatty Acids (FISH OIL PO) Take 1,200 mg by mouth daily.     Marland Kitchen spironolactone (ALDACTONE) 25 MG tablet Take 25 mg by mouth daily.  45 tablet 11  . warfarin (COUMADIN) 2 MG tablet Take 1-2 mg by mouth daily. 1mg  on Tuesdays and Fridays. 2mg  all other days.    Marland Kitchen warfarin (COUMADIN) 2 MG tablet TAKE 0.5-1 TABLETS (1-2 MG TOTAL) BY MOUTH DAILY AS DIRECTED 30 tablet 3  . diltiazem (CARDIZEM) 30 MG tablet Take 1 tablet by mouth every 4 hours AS NEEDED for heart rate >100 as long as blood pressure >100. 45 tablet 3   No current facility-administered  medications for this encounter.     Allergies  Allergen Reactions  . Bee Venom Anaphylaxis  . Other Nausea And Vomiting    Pain medications cause severe vomiting. Tolerated slow IV morphine drip    Social History   Social History  . Marital status: Married    Spouse name: N/A  .  Number of children: N/A  . Years of education: N/A   Occupational History  . Retired    Social History Main Topics  . Smoking status: Former Smoker    Packs/day: 1.00    Years: 32.00    Types: Cigarettes    Quit date: 02/03/1974  . Smokeless tobacco: Never Used  . Alcohol use 3.0 oz/week    5 Glasses of wine per week     Comment: 11/12/2012 "4oz wine w/dinner 5 days/wk"  . Drug use: No  . Sexual activity: No   Other Topics Concern  . Not on file   Social History Narrative   Lives with family.    Family History  Problem Relation Age of Onset  . Stroke Mother   . Heart attack Father   . Diabetes Father   . Hypertension Father   . Heart attack Brother   . Diabetes Brother   . Hypertension Brother   . Kidney failure Brother     ROS- All systems are reviewed and negative except as per the HPI above  Physical Exam: Vitals:   12/06/15 1511  BP: 108/68  Pulse: (!) 109  Weight: 173 lb 9.6 oz (78.7 kg)  Height: 5' (1.524 m)    GEN- The patient is well appearing, alert and oriented x 3 today.   Head- normocephalic, atraumatic Eyes-  Sclera clear, conjunctiva pink Ears- hearing intact Oropharynx- clear Neck- supple, no JVP Lymph- no cervical lymphadenopathy Lungs- Clear to ausculation bilaterally, normal work of breathing Heart- irregular rate and rhythm, no murmurs, rubs or gallops, PMI not laterally displaced GI- soft, NT, ND, + BS Extremities- no clubbing, cyanosis, or edema MS- no significant deformity or atrophy Skin- no rash or lesion Psych- euthymic mood, full affect Neuro- strength and sensation are intact  EKG- Afib LBBB v rate at 109 ms, qrs 146 ms, qtc 495 ms Epic  records reviewed   Assessment and Plan: 1. Permanent afib with increased v rates Continue warfarin Continue daily cardizem Continue  Metoprolol Add 30 mg cardizem to use when pt is aware of increased heart rate and chest pain I believe the higher v rates trigger chest discomfort I would like her to try this instead of sl ntg which is dropping BP Will decrease Imdur back to 120 mg qd to have more BP to use prn cardizem  2. CAD Continue BB, plavix, statin, imdur, ace and sl ntg as needed  F/u in one week, notify office if v rates are not controlled with this plan or has more chest pain  Butch Penny C. Carroll, [facility] [address] [place], [place] [address] [phone]

## 2015-12-13 ENCOUNTER — Inpatient Hospital Stay (HOSPITAL_COMMUNITY)
Admission: EM | Admit: 2015-12-13 | Discharge: 2015-12-15 | DRG: 287 | Disposition: A | Discharge status: Home / Self Care | Payer: Medicare Other | Attending: Cardiovascular Disease | Admitting: Cardiovascular Disease

## 2015-12-13 ENCOUNTER — Ambulatory Visit: Payer: Medicare Other

## 2015-12-13 ENCOUNTER — Encounter (HOSPITAL_COMMUNITY): Payer: Self-pay | Admitting: Emergency Medicine

## 2015-12-13 ENCOUNTER — Emergency Department (HOSPITAL_COMMUNITY): Payer: Medicare Other

## 2015-12-13 ENCOUNTER — Ambulatory Visit (HOSPITAL_COMMUNITY): Payer: Medicare Other | Admitting: Nurse Practitioner

## 2015-12-13 DIAGNOSIS — I200 Unstable angina: Secondary | ICD-10-CM | POA: Diagnosis not present

## 2015-12-13 DIAGNOSIS — I248 Other forms of acute ischemic heart disease: Secondary | ICD-10-CM | POA: Diagnosis present

## 2015-12-13 DIAGNOSIS — I447 Left bundle-branch block, unspecified: Secondary | ICD-10-CM | POA: Diagnosis present

## 2015-12-13 DIAGNOSIS — I110 Hypertensive heart disease with heart failure: Secondary | ICD-10-CM | POA: Diagnosis present

## 2015-12-13 DIAGNOSIS — I4819 Other persistent atrial fibrillation: Secondary | ICD-10-CM

## 2015-12-13 DIAGNOSIS — I4892 Unspecified atrial flutter: Secondary | ICD-10-CM | POA: Diagnosis present

## 2015-12-13 DIAGNOSIS — Z833 Family history of diabetes mellitus: Secondary | ICD-10-CM | POA: Diagnosis not present

## 2015-12-13 DIAGNOSIS — I5042 Chronic combined systolic (congestive) and diastolic (congestive) heart failure: Secondary | ICD-10-CM | POA: Diagnosis present

## 2015-12-13 DIAGNOSIS — I2489 Other forms of acute ischemic heart disease: Secondary | ICD-10-CM | POA: Diagnosis present

## 2015-12-13 DIAGNOSIS — I495 Sick sinus syndrome: Secondary | ICD-10-CM | POA: Diagnosis present

## 2015-12-13 DIAGNOSIS — Z950 Presence of cardiac pacemaker: Secondary | ICD-10-CM

## 2015-12-13 DIAGNOSIS — I252 Old myocardial infarction: Secondary | ICD-10-CM | POA: Diagnosis not present

## 2015-12-13 DIAGNOSIS — I4821 Permanent atrial fibrillation: Secondary | ICD-10-CM | POA: Diagnosis present

## 2015-12-13 DIAGNOSIS — R079 Chest pain, unspecified: Secondary | ICD-10-CM

## 2015-12-13 DIAGNOSIS — Z955 Presence of coronary angioplasty implant and graft: Secondary | ICD-10-CM | POA: Diagnosis not present

## 2015-12-13 DIAGNOSIS — I25119 Atherosclerotic heart disease of native coronary artery with unspecified angina pectoris: Principal | ICD-10-CM | POA: Diagnosis present

## 2015-12-13 DIAGNOSIS — Z7901 Long term (current) use of anticoagulants: Secondary | ICD-10-CM

## 2015-12-13 DIAGNOSIS — I701 Atherosclerosis of renal artery: Secondary | ICD-10-CM | POA: Diagnosis present

## 2015-12-13 DIAGNOSIS — R072 Precordial pain: Secondary | ICD-10-CM | POA: Diagnosis not present

## 2015-12-13 DIAGNOSIS — E785 Hyperlipidemia, unspecified: Secondary | ICD-10-CM | POA: Diagnosis present

## 2015-12-13 DIAGNOSIS — R748 Abnormal levels of other serum enzymes: Secondary | ICD-10-CM | POA: Diagnosis not present

## 2015-12-13 DIAGNOSIS — Z841 Family history of disorders of kidney and ureter: Secondary | ICD-10-CM | POA: Diagnosis not present

## 2015-12-13 DIAGNOSIS — R000 Tachycardia, unspecified: Secondary | ICD-10-CM | POA: Diagnosis present

## 2015-12-13 DIAGNOSIS — Z7902 Long term (current) use of antithrombotics/antiplatelets: Secondary | ICD-10-CM | POA: Diagnosis not present

## 2015-12-13 DIAGNOSIS — I2510 Atherosclerotic heart disease of native coronary artery without angina pectoris: Secondary | ICD-10-CM | POA: Diagnosis not present

## 2015-12-13 DIAGNOSIS — I10 Essential (primary) hypertension: Secondary | ICD-10-CM | POA: Diagnosis present

## 2015-12-13 DIAGNOSIS — I481 Persistent atrial fibrillation: Secondary | ICD-10-CM | POA: Diagnosis not present

## 2015-12-13 DIAGNOSIS — Z87891 Personal history of nicotine dependence: Secondary | ICD-10-CM | POA: Diagnosis not present

## 2015-12-13 DIAGNOSIS — Z91030 Bee allergy status: Secondary | ICD-10-CM

## 2015-12-13 DIAGNOSIS — Z8249 Family history of ischemic heart disease and other diseases of the circulatory system: Secondary | ICD-10-CM

## 2015-12-13 DIAGNOSIS — I739 Peripheral vascular disease, unspecified: Secondary | ICD-10-CM | POA: Diagnosis present

## 2015-12-13 DIAGNOSIS — G4733 Obstructive sleep apnea (adult) (pediatric): Secondary | ICD-10-CM | POA: Diagnosis present

## 2015-12-13 DIAGNOSIS — Z823 Family history of stroke: Secondary | ICD-10-CM

## 2015-12-13 DIAGNOSIS — E669 Obesity, unspecified: Secondary | ICD-10-CM | POA: Diagnosis present

## 2015-12-13 DIAGNOSIS — Z6833 Body mass index (BMI) 33.0-33.9, adult: Secondary | ICD-10-CM

## 2015-12-13 DIAGNOSIS — I482 Chronic atrial fibrillation: Secondary | ICD-10-CM | POA: Diagnosis present

## 2015-12-13 LAB — URINE MICROSCOPIC-ADD ON
Bacteria, UA: NONE SEEN
RBC / HPF: NONE SEEN RBC/hpf (ref 0–5)

## 2015-12-13 LAB — COMPREHENSIVE METABOLIC PANEL
ALBUMIN: 3 g/dL — AB (ref 3.5–5.0)
ALK PHOS: 71 U/L (ref 38–126)
ALT: 27 U/L (ref 14–54)
AST: 32 U/L (ref 15–41)
Anion gap: 9 (ref 5–15)
BUN: 37 mg/dL — ABNORMAL HIGH (ref 6–20)
CALCIUM: 8.6 mg/dL — AB (ref 8.9–10.3)
CHLORIDE: 101 mmol/L (ref 101–111)
CO2: 24 mmol/L (ref 22–32)
CREATININE: 1.53 mg/dL — AB (ref 0.44–1.00)
GFR calc Af Amer: 35 mL/min — ABNORMAL LOW (ref >60)
GFR calc non Af Amer: 30 mL/min — ABNORMAL LOW (ref >60)
GLUCOSE: 116 mg/dL — AB (ref 65–99)
Potassium: 4.4 mmol/L (ref 3.5–5.1)
SODIUM: 134 mmol/L — AB (ref 135–145)
Total Bilirubin: 0.9 mg/dL (ref 0.3–1.2)
Total Protein: 6.5 g/dL (ref 6.5–8.1)

## 2015-12-13 LAB — CBC
HEMATOCRIT: 35.8 % — AB (ref 36.0–46.0)
HEMOGLOBIN: 12.1 g/dL (ref 12.0–15.0)
MCH: 32.2 pg (ref 26.0–34.0)
MCHC: 33.8 g/dL (ref 30.0–36.0)
MCV: 95.2 fL (ref 78.0–100.0)
Platelets: 329 10*3/uL (ref 150–400)
RBC: 3.76 MIL/uL — AB (ref 3.87–5.11)
RDW: 14.3 % (ref 11.5–15.5)
WBC: 7.9 10*3/uL (ref 4.0–10.5)

## 2015-12-13 LAB — I-STAT TROPONIN, ED
TROPONIN I, POC: 0.03 ng/mL (ref 0.00–0.08)
Troponin i, poc: 0.02 ng/mL (ref 0.00–0.08)

## 2015-12-13 LAB — URINALYSIS, ROUTINE W REFLEX MICROSCOPIC
Bilirubin Urine: NEGATIVE
GLUCOSE, UA: NEGATIVE mg/dL
HGB URINE DIPSTICK: NEGATIVE
Ketones, ur: NEGATIVE mg/dL
Nitrite: NEGATIVE
Protein, ur: NEGATIVE mg/dL
SPECIFIC GRAVITY, URINE: 1.009 (ref 1.005–1.030)
pH: 6 (ref 5.0–8.0)

## 2015-12-13 LAB — PROTIME-INR
INR: 3.07
Prothrombin Time: 32.4 seconds — ABNORMAL HIGH (ref 11.4–15.2)

## 2015-12-13 LAB — TROPONIN I: TROPONIN I: 0.1 ng/mL — AB (ref <0.03)

## 2015-12-13 MED ORDER — SODIUM CHLORIDE 0.9% FLUSH: 3.0000 mL | INTRAVENOUS | Status: DC | PRN

## 2015-12-13 MED ORDER — SPIRONOLACTONE 25 MG PO TABS
25.0000 mg | ORAL_TABLET | Freq: Every day | ORAL | Status: DC
  Administered 2015-12-13 – 2015-12-15 (×2): 25 mg via ORAL
  Filled 2015-12-13 (×3): qty 1

## 2015-12-13 MED ORDER — INFLUENZA VAC SPLIT QUAD 0.5 ML IM SUSY: 0.5000 mL | PREFILLED_SYRINGE | INTRAMUSCULAR | Status: DC | PRN

## 2015-12-13 MED ORDER — SODIUM CHLORIDE 0.9 % WEIGHT BASED INFUSION
1.0000 mL/kg/h | INTRAVENOUS | Status: DC
  Administered 2015-12-14: 1 mL/kg/h via INTRAVENOUS

## 2015-12-13 MED ORDER — ATORVASTATIN CALCIUM 80 MG PO TABS
80.0000 mg | ORAL_TABLET | Freq: Every morning | ORAL | Status: DC
  Administered 2015-12-14 – 2015-12-15 (×2): 80 mg via ORAL
  Filled 2015-12-13 (×2): qty 1

## 2015-12-13 MED ORDER — NITROGLYCERIN 0.4 MG SL SUBL: 0.4000 mg | SUBLINGUAL_TABLET | SUBLINGUAL | Status: DC | PRN

## 2015-12-13 MED ORDER — CLOPIDOGREL BISULFATE 75 MG PO TABS
75.0000 mg | ORAL_TABLET | Freq: Every day | ORAL | Status: DC
  Administered 2015-12-13 – 2015-12-15 (×3): 75 mg via ORAL
  Filled 2015-12-13 (×3): qty 1

## 2015-12-13 MED ORDER — ISOSORBIDE MONONITRATE ER 60 MG PO TB24
120.0000 mg | ORAL_TABLET | Freq: Every day | ORAL | Status: DC
  Administered 2015-12-13 – 2015-12-15 (×3): 120 mg via ORAL
  Filled 2015-12-13 (×3): qty 2

## 2015-12-13 MED ORDER — METOPROLOL TARTRATE 100 MG PO TABS
100.0000 mg | ORAL_TABLET | Freq: Two times a day (BID) | ORAL | Status: DC
  Administered 2015-12-13 – 2015-12-15 (×4): 100 mg via ORAL
  Filled 2015-12-13 (×4): qty 1

## 2015-12-13 MED ORDER — ACETAMINOPHEN 325 MG PO TABS: 650.0000 mg | ORAL_TABLET | ORAL | Status: DC | PRN

## 2015-12-13 MED ORDER — SODIUM CHLORIDE 0.9% FLUSH
3.0000 mL | Freq: Two times a day (BID) | INTRAVENOUS | Status: DC
  Administered 2015-12-13: 3 mL via INTRAVENOUS

## 2015-12-13 MED ORDER — ONDANSETRON HCL 4 MG/2ML IJ SOLN: 4.0000 mg | Freq: Four times a day (QID) | INTRAMUSCULAR | Status: DC | PRN

## 2015-12-13 MED ORDER — SODIUM CHLORIDE 0.9 % IV SOLN: 250.0000 mL | INTRAVENOUS | Status: DC | PRN

## 2015-12-13 MED ORDER — PHYTONADIONE 5 MG PO TABS
2.5000 mg | ORAL_TABLET | Freq: Once | ORAL | Status: AC
  Administered 2015-12-13: 2.5 mg via ORAL
  Filled 2015-12-13 (×2): qty 1

## 2015-12-13 MED ORDER — DILTIAZEM HCL ER COATED BEADS 240 MG PO CP24
240.0000 mg | ORAL_CAPSULE | Freq: Every day | ORAL | Status: DC
Start: 2015-12-13 — End: 2015-12-15
  Administered 2015-12-13 – 2015-12-15 (×3): 240 mg via ORAL
  Filled 2015-12-13 (×3): qty 1

## 2015-12-13 MED ORDER — SODIUM CHLORIDE 0.9 % WEIGHT BASED INFUSION
3.0000 mL/kg/h | INTRAVENOUS | Status: DC
Start: 2015-12-14 — End: 2015-12-14
  Administered 2015-12-14: 3 mL/kg/h via INTRAVENOUS

## 2015-12-13 MED ORDER — ASPIRIN EC 81 MG PO TBEC
81.0000 mg | DELAYED_RELEASE_TABLET | Freq: Every day | ORAL | Status: DC
  Administered 2015-12-14 – 2015-12-15 (×2): 81 mg via ORAL
  Filled 2015-12-13 (×2): qty 1

## 2015-12-13 NOTE — ED Triage Notes (Signed)
Per EMS pt experienced chest tightness this morning after getting up to go the bathroom. Pt states this has been happening for the past 1-2 weeks. Pt states that last Thursday she saw her Cardiologist who changed her medications then and has another follow up appointment today. Pt states that she took 1 Nitro at home before EMS arrived. Pt has hx of angina x 15 years with 6 stents placed. Pt has pacer and hx of A.Fib. Pt was given 324 mg ASA and another Nitro by EMS.  EMS vitals 122 95% 110/50

## 2015-12-13 NOTE — ED Notes (Signed)
Cardiology at bedside.

## 2015-12-13 NOTE — ED Provider Notes (Signed)
I saw and evaluated the patient, reviewed the resident's note and I agree with the findings and plan.   EKG Interpretation  Date/Time:  Thursday December 13 2015 07:52:57 EST Ventricular Rate:  112 PR Interval:    QRS Duration: 147 QT Interval:  357 QTC Calculation: 488 R Axis:   -105 Text Interpretation:  Atrial flutter with predominant 2:1 AV block Nonspecific IVCD with LAD Anterior infarct, old History of chronic atrial fibrilltion No significant change since last tracing Confirmed by Janani Chamber MD, Gurleen Larrivee [phone]) on 12/13/2015 8:35:23 AM       I have independently reviewed the following tracings and/or images and used them in my medical decision making: CXR  82 year old female who presents with chest pain. History of paroxysmal atrial fibrillation on Coumadin, pacemaker placement for junctional rhythm, coronary artery disease with prior stent placement. With history of stable angina reporting daily episodes of chest pressure when she wakes up in the morning that resolves with nitroglycerin. States that she has been using increased frequency of her nitroglycerin recently over the past several weeks, this is also in the setting of recent passing away of her husband back in July. States that she has been more anxious and more stressed in the setting, but this morning woke up with worsening aching chest pain atypical of her anginal-type pains. She called EMS, and was given aspirin and nitroglycerin with improvement in her chest pain. States that she has been having more issues with rate control recently, and was seen in the cardiovascular clinic recently with increased dosage of her diltiazem. States that she has not been able to take her extra diltiazem dose due to needing to take her nitroglycerin frequently due to lower blood pressures.  Chest pain-free in the ED. EKG without acute ischemic changes. Troponin 1 is negative. Therapeutic INR. Chest x-ray visualized shows no acute cardiopulmonary  processes. Her exam overall is unremarkable vital signs are stable. Concern for unstable angina. Discussed with cardiology who will plan to admit for potential repeat cath and ongoing management.     Forde Dandy, MD 12/13/15 [id number]

## 2015-12-13 NOTE — ED Notes (Signed)
Harlan Stains, NP with Cardiology advised this RN to release vitamin k order for patient at this time

## 2015-12-13 NOTE — ED Provider Notes (Signed)
[place] DEPT Provider Note  CSN: [id number] Arrival date & time: 12/13/15  0745  History   Chief Complaint Chief Complaint  Patient presents with  . Chest Pain   HPI Patty [name] is a 82 y.o. female.   Chest Pain   This is a recurrent problem. The current episode started 3 to 5 hours ago. The problem occurs constantly. The problem has been resolved. Associated with: in the morning. The pain is present in the substernal region. The pain is at a severity of 0/10. The pain is mild. The quality of the pain is described as dull and pressure-like. The pain does not radiate. Associated symptoms include exertional chest pressure, irregular heartbeat, malaise/fatigue and shortness of breath. Pertinent negatives include no abdominal pain, no back pain, no cough, no dizziness, no fever, no nausea and no vomiting. She has tried rest for the symptoms.   Past Medical History:  Diagnosis Date  . Chronic anticoagulation - coumadin, CHADS2VASC=6 05/17/2015  . Combined systolic and diastolic heart failure, NYHA class 3 ([place])   . Coronary artery disease    a. s/p multiple stents  b. LHC 12/14/13 with sig CAD with TO of OM1, 70-80% occusion OM2, 50-70% mLCx, 50-70% pLAD (recent FFR 0.83), diffusely diseased RCA, widely patent stetns in pLAD and LCx.  . Diverticulitis   . Granulosa cell carcinoma ([place])    abd; last episode was in 2009  . Hyperlipidemia   . Hypertension   . LBBB (left bundle branch block)       . Myocardial infarction 2002  . Obesity   . OSA on CPAP   . Pacemaker - St Jude Leadless PPM   . Peripheral vascular disease ([place])   . Permanent atrial fibrillation ([place]) 2013  . Renal artery stenosis ([place])   . Tachycardia-bradycardia syndrome ([place])    a. s/p leadless pacemaker ([name]) implanted by Dr [name]  . Varicose veins    Patient Active Problem List   Diagnosis Date Noted  . NSTEMI (non-ST elevated myocardial infarction) ([place]) 05/18/2015  . Chronic anticoagulation -  coumadin, CHADS2VASC=6 05/17/2015  . Unstable angina ([place]) 05/17/2015  . Swelling of lower extremity 04/18/2015  . Encounter for therapeutic drug monitoring 08/25/2014  . 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 Unstable angina pectoris ([place])   . Ischemic chest pain ([place])   . OSA on CPAP 05/14/2014  . Chest pain 01/05/2014  . Atrial flutter, unspecified   . Hypokalemia   . Pacemaker - St Jude Leadless PPM   . Combined systolic and diastolic heart failure, NYHA class 3 ([place])   . Tachycardia-bradycardia syndrome ([place]) 09/30/2012  . Atrial fibrillation with RVR ([place]) 12/28/2011  . Ventral hernia 10/16/2011  . Granulosa cell tumor of ovary ([place]) 04/23/2011  . CAD (coronary artery disease) 10/03/2010  . HTN (hypertension) 10/03/2010  . Hyperlipidemia 10/03/2010  . Renal artery stenosis ([place]) 10/03/2010  . Obesity 10/03/2010    Past Surgical History:  Procedure Laterality Date  . ABDOMINAL HYSTERECTOMY    . CARDIAC CATHETERIZATION  09/03/2007   EF 70%; Failed attempt at PCI to OM  . CARDIAC CATHETERIZATION  11/01/2003   EF 70%  . CARDIOVERSION  12/31/2011   Procedure: CARDIOVERSION;  Surgeon: [name], MD;  Location: [facility] ENDOSCOPY;  Service: Cardiovascular;  Laterality: N/A;  . CARDIOVERSION N/A 12/31/2011   Procedure: CARDIOVERSION;  Surgeon: [name], MD;  Location: [facility] CATH LAB;  Service: Cardiovascular;  Laterality: N/A;  . CATARACT EXTRACTION, BILATERAL  2015  . CHOLECYSTECTOMY  1980's  . COLON SURGERY  2004   colectomy for diverticulosis  . CORONARY ANGIOPLASTY WITH STENT PLACEMENT  2000; 08/11/2012; 11/12/2012   3 + 2 LAD & CFX; 2nd CFX stent 11/12/2012  . FRACTIONAL FLOW RESERVE WIRE  10/07/2013   Procedure: FRACTIONAL FLOW RESERVE WIRE;  Surgeon: [name], MD;  Location: [facility] CATH LAB;  Service: Cardiovascular;;  . granulosa tumor excision  2000; 2003; 2004; 2007   "all in my abdomen including small intestines, outside my  ?uterus/etc" (08/11/2012)  . HERNIA REPAIR  2005   "laparoscopic"  . LEFT HEART CATHETERIZATION WITH CORONARY ANGIOGRAM N/A 11/12/2012   Procedure: LEFT HEART CATHETERIZATION WITH CORONARY ANGIOGRAM;  Surgeon: [name], MD;  Location: [facility] CATH LAB;  Service: Cardiovascular;  Laterality: N/A;  . LEFT HEART CATHETERIZATION WITH CORONARY ANGIOGRAM N/A 10/07/2013   Procedure: LEFT HEART CATHETERIZATION WITH CORONARY ANGIOGRAM;  Surgeon: [name], MD;  Location: [facility] CATH LAB;  Service: Cardiovascular;  Laterality: N/A;  . LEFT HEART CATHETERIZATION WITH CORONARY ANGIOGRAM N/A 12/14/2013   Procedure: LEFT HEART CATHETERIZATION WITH CORONARY ANGIOGRAM;  Surgeon: Sinclair Grooms, MD;  Location: [facility] CATH LAB;  Service: Cardiovascular;  Laterality: N/A;  . LEFT HEART CATHETERIZATION WITH CORONARY ANGIOGRAM N/A 05/16/2014   Procedure: LEFT HEART CATHETERIZATION WITH CORONARY ANGIOGRAM;  Surgeon: Sherren Mocha, MD;  Location: [facility] CATH LAB;  Service: Cardiovascular;  Laterality: N/A;  . PERCUTANEOUS CORONARY INTERVENTION-BALLOON ONLY  08/04/2012   Procedure: PERCUTANEOUS CORONARY INTERVENTION-BALLOON ONLY;  Surgeon: [name], MD;  Location: [facility] CATH LAB;  Service: Cardiovascular;;  . PERCUTANEOUS CORONARY ROTOBLATOR INTERVENTION (PCI-R) N/A 08/11/2012   Procedure: PERCUTANEOUS CORONARY ROTOBLATOR INTERVENTION (PCI-R);  Surgeon: [name], MD;  Location: [facility] CATH LAB;  Service: Cardiovascular;  Laterality: N/A;  . PERCUTANEOUS CORONARY STENT INTERVENTION (PCI-S)  10/07/2013   Procedure: PERCUTANEOUS CORONARY STENT INTERVENTION (PCI-S);  Surgeon: [name], MD;  Location: [facility] CATH LAB;  Service: Cardiovascular;;  . PERMANENT PACEMAKER INSERTION N/A 03/16/2012   [name] (SJM) leadless pacemaker (LEADLESS II STUDY PATEINT)  . SALIVARY GLAND SURGERY  2000's   "had a little lump removed; granulosa related; it was benign" (08/11/2012)  . TEE WITHOUT CARDIOVERSION  12/31/2011    Procedure: TRANSESOPHAGEAL ECHOCARDIOGRAM (TEE);  Surgeon: [name], MD;  Location: [place];  Service: Cardiovascular;  Laterality: N/A;  . VARICOSE VEIN SURGERY Bilateral 1977    OB History    No data available       Home Medications    Prior to Admission medications   Medication Sig Start Date End Date Taking? Authorizing Provider  atorvastatin (LIPITOR) 80 MG tablet TAKE 1 TABLET BY MOUTH EVERY MORNING 09/12/15   [name], MD  beta carotene w/minerals (OCUVITE) tablet Take 2 tablets by mouth daily.    Historical Provider, MD  Calcium Carb-Cholecalciferol (CALTRATE 600+D) 600-800 MG-UNIT TABS Take 1 tablet by mouth every morning.     Historical Provider, MD  clopidogrel (PLAVIX) 75 MG tablet TAKE 1 TABLET BY MOUTH EVERY DAY WITH BREAKFAST 05/07/15   [name], MD  Coenzyme Q-10 100 MG capsule Take 200 mg by mouth at bedtime.     Historical Provider, MD  diltiazem (CARDIZEM CD) 120 MG 24 hr capsule Take 1 capsule (120 mg total) by mouth daily. 06/22/15   Sherran Needs, NP  diltiazem (CARDIZEM) 30 MG tablet Take 1 tablet by mouth every 4 hours AS NEEDED for heart rate >100 as long  as blood pressure >100. 12/06/15   Sherran Needs, NP  furosemide (LASIX) 40 MG tablet Take 80 mg by mouth daily.    Historical Provider, MD  isosorbide mononitrate (IMDUR) 120 MG 24 hr tablet Take 1 tablet (120 mg total) by mouth daily. 12/06/15   Sherran Needs, NP  KLOR-CON M20 20 MEQ tablet TAKE 1 TABLET (20 MEQ TOTAL) BY MOUTH DAILY. 11/29/15   [name], MD  lisinopril (PRINIVIL,ZESTRIL) 5 MG tablet TAKE 1 TABLET BY MOUTH 2 TIMES DAILY 09/12/15   [name], MD  metoprolol (LOPRESSOR) 100 MG tablet TAKE 1 TABLET (100 MG TOTAL) BY MOUTH 2 (TWO) TIMES DAILY. 11/29/15   Thompson Grayer, MD  Multiple Vitamin (MULTIVITAMIN) capsule Take 1 capsule by mouth daily.    Historical Provider, MD  nitroGLYCERIN (NITROSTAT) 0.4 MG SL tablet PLACE 1 TABLET (0.4 MG TOTAL)  UNDER THE TONGUE EVERY 5 (FIVE) MINUTES AS NEEDED. FOR CHEST PAIN 11/14/15   [name], MD  Omega-3 Fatty Acids (FISH OIL PO) Take 1,200 mg by mouth daily.     Historical Provider, MD  spironolactone (ALDACTONE) 25 MG tablet Take 25 mg by mouth daily.  05/21/15   [name], MD  warfarin (COUMADIN) 2 MG tablet Take 1-2 mg by mouth daily. 1mg  on Tuesdays and Fridays. 2mg  all other days.    Historical Provider, MD  warfarin (COUMADIN) 2 MG tablet TAKE 0.5-1 TABLETS (1-2 MG TOTAL) BY MOUTH DAILY AS DIRECTED 07/23/15   [name], MD    Family History Family History  Problem Relation Age of Onset  . Stroke Mother   . Heart attack Father   . Diabetes Father   . Hypertension Father   . Heart attack Brother   . Diabetes Brother   . Hypertension Brother   . Kidney failure Brother     Social History Social History  Substance Use Topics  . Smoking status: Former Smoker    Packs/day: 1.00    Years: 32.00    Types: Cigarettes    Quit date: 02/03/1974  . Smokeless tobacco: Never Used  . Alcohol use 3.0 oz/week    5 Glasses of wine per week     Comment: 11/12/2012 "4oz wine w/dinner 5 days/wk"   Allergies   Bee venom and Other   Review of Systems Review of Systems  Constitutional: Positive for fatigue and malaise/fatigue. Negative for fever.  Respiratory: Positive for shortness of breath. Negative for cough.   Cardiovascular: Positive for chest pain.  Gastrointestinal: Negative for abdominal pain, nausea and vomiting.  Genitourinary: Positive for dysuria.  Musculoskeletal: Negative for back pain.  Neurological: Negative for dizziness.  All other systems reviewed and are negative.  Physical Exam Updated Vital Signs BP 114/86 (BP Location: Right Arm)   Pulse 119   Temp 98.1 F (36.7 C) (Oral)   Resp 20   Ht 5' (1.524 m)   Wt 78.5 kg   SpO2 100%   BMI 33.79 kg/m   Physical Exam  Constitutional: She is oriented to person, place, and time. She  appears well-developed and well-nourished. No distress.  HENT:  Head: Normocephalic and atraumatic.  Eyes: EOM are normal.  Neck: Normal range of motion. No JVD present.  Cardiovascular:  Tachycardic 110's  Irregular  Pulmonary/Chest: Effort normal and breath sounds normal. No respiratory distress. She has no wheezes.  Abdominal: Soft.  Musculoskeletal: Normal range of motion.  Trace bilateral edema noted bilaterally  Neurological: She is alert and oriented  to person, place, and time. No cranial nerve deficit. Coordination normal.  Skin: Skin is warm. Capillary refill takes less than 2 seconds. She is not diaphoretic.  Psychiatric: Her behavior is normal. Thought content normal.  Nursing note and vitals reviewed.  ED Treatments / Results  Labs (all labs ordered are listed, but only abnormal results are displayed) Labs Reviewed  CBC - Abnormal; Notable for the following:       Result Value   RBC 3.76 (*)    HCT 35.8 (*)    All other components within normal limits  COMPREHENSIVE METABOLIC PANEL - Abnormal; Notable for the following:    Sodium 134 (*)    Glucose, Bld 116 (*)    BUN 37 (*)    Creatinine, Ser 1.53 (*)    Calcium 8.6 (*)    Albumin 3.0 (*)    GFR calc non Af Amer 30 (*)    GFR calc Af Amer 35 (*)    All other components within normal limits  PROTIME-INR - Abnormal; Notable for the following:    Prothrombin Time 32.4 (*)    All other components within normal limits  URINALYSIS, ROUTINE W REFLEX MICROSCOPIC (NOT AT [facility]) - Abnormal; Notable for the following:    Leukocytes, UA TRACE (*)    All other components within normal limits  URINE MICROSCOPIC-ADD ON - Abnormal; Notable for the following:    Squamous Epithelial / LPF 0-5 (*)    All other components within normal limits  I-STAT TROPOININ, ED  I-STAT TROPOININ, ED    EKG  EKG Interpretation  Date/Time:  Thursday December 13 2015 07:52:57 EST Ventricular Rate:  112 PR Interval:    QRS  Duration: 147 QT Interval:  357 QTC Calculation: 488 R Axis:   -105 Text Interpretation:  Atrial flutter with predominant 2:1 AV block Nonspecific IVCD with LAD Anterior infarct, old History of chronic atrial fibrilltion No significant change since last tracing Confirmed by LIU MD, DANA [id number]) on 12/13/2015 8:35:23 AM      Radiology No results found.  Procedures Procedures (including critical care time)  Medications Ordered in ED Medications - No data to display  Initial Impression / Assessment and Plan / ED Course  I have reviewed the triage vital signs and the nursing notes.  Pertinent labs & imaging results that were available during my care of the patient were reviewed by me and considered in my medical decision making (see chart for details).  Clinical Course     Patient is an 82 year old female with past medical history of coronary artery disease, atrial fibrillation currently on Coumadin, CHF presents to emergency department today with chest pressure. Patient states that over the past few weeks she has had recurrent chest pressure in the mornings which is usually relieved by nitroglycerin or rest. She states that this is typical for her and that she only decided to seek treatment today because of worsening pain and some associated shortness of breath. Patient has no nausea, diaphoresis or vomiting associated with this.  To note patient is emotionally labile and still grieving over her husband who recently passed away in 09/21/22 after 65 years of marriage. She thinks that a lot of her chest pressure and pain is secondary to emotional stress. She had a heart catheterization in 2016 which required PCI of her LAD. She is also followed by her cardiologist. Most recently recommendation of giving Cardizem 30 mg every 4 hours in addition to 120 mg of Cardizem daily. No other  medication changes.  Patient slightly tachycardic here but well appearing. EKG shows LBBB with atrial fib and no  significant changes from the past.   Upon review of systems patient has some dysuria so will check for urinary tract infection as she has slight tachycardia. Otherwise well-appearing. She may have poor by mouth intake as she states that she doesn't eat as much since losing her husband.  Patient with two negative troponins. Continues to be in A-fib without obvious infection or source.   Cardiology consulted given continued tachycardia and history of CAD.  They will admit for cath possibly tomorrow.    Final Clinical Impressions(s) / ED Diagnoses   Final diagnoses:  Chest pain, unspecified type  Persistent atrial fibrillation [facility])      Roberto Scales, MD 12/13/15 [place] Liu, MD 12/13/15 1727

## 2015-12-13 NOTE — ED Notes (Signed)
ED Provider at bedside. 

## 2015-12-13 NOTE — Progress Notes (Signed)
On call MD returned my page about patientt's 0.10 troponin level. No new orders at this time. Will continue to monitor. Call light within reach.

## 2015-12-13 NOTE — H&P (Signed)
History & Physical    Patient ID: ARWEN DELFINO MRN: [id number], DOB/AGE: 09/09/1930   Admit date: 12/13/2015   Primary Physician: Kandice Hams, MD Primary Cardiologist: Dr. Glori Bickers  Patient Profile    82 year old female with past medical history of CAD s/p multiple stents, systolic heart failure, A. Fib s/p PPM, hypertension, hyperlipidemia who presented complaining of chest pressure over the past couple of weeks.   Past Medical History    Past Medical History:  Diagnosis Date  . Chronic anticoagulation - coumadin, CHADS2VASC=6 05/17/2015  . Combined systolic and diastolic heart failure, NYHA class 3 ([place])   . Coronary artery disease    a. s/p multiple stents  b. LHC 12/14/13 with sig CAD with TO of OM1, 70-80% occusion OM2, 50-70% mLCx, 50-70% pLAD (recent FFR 0.83), diffusely diseased RCA, widely patent stetns in pLAD and LCx.  . Diverticulitis   . Granulosa cell carcinoma ([place])    abd; last episode was in 2009  . Hyperlipidemia   . Hypertension   . LBBB (left bundle branch block)       . Myocardial infarction 2002  . Obesity   . OSA on CPAP   . Pacemaker - St Jude Leadless PPM   . Peripheral vascular disease ([place])   . Permanent atrial fibrillation ([place]) 2013  . Renal artery stenosis ([place])   . Tachycardia-bradycardia syndrome ([place])    a. s/p leadless pacemaker ([name]) implanted by Dr [name]  . Varicose veins     Past Surgical History:  Procedure Laterality Date  . ABDOMINAL HYSTERECTOMY    . CARDIAC CATHETERIZATION  09/03/2007   EF 70%; Failed attempt at PCI to OM  . CARDIAC CATHETERIZATION  11/01/2003   EF 70%  . CARDIOVERSION  12/31/2011   Procedure: CARDIOVERSION;  Surgeon: [name], MD;  Location: [facility] ENDOSCOPY;  Service: Cardiovascular;  Laterality: N/A;  . CARDIOVERSION N/A 12/31/2011   Procedure: CARDIOVERSION;  Surgeon: [name], MD;  Location: [facility] CATH LAB;  Service: Cardiovascular;  Laterality: N/A;  . CATARACT  EXTRACTION, BILATERAL  2015  . CHOLECYSTECTOMY  1980's  . COLON SURGERY  2004   colectomy for diverticulosis  . CORONARY ANGIOPLASTY WITH STENT PLACEMENT  2000; 08/11/2012; 11/12/2012   3 + 2 LAD & CFX; 2nd CFX stent 11/12/2012  . FRACTIONAL FLOW RESERVE WIRE  10/07/2013   Procedure: FRACTIONAL FLOW RESERVE WIRE;  Surgeon: [name], MD;  Location: [facility] CATH LAB;  Service: Cardiovascular;;  . granulosa tumor excision  2000; 2003; 2004; 2007   "all in my abdomen including small intestines, outside my ?uterus/etc" (08/11/2012)  . HERNIA REPAIR  2005   "laparoscopic"  . LEFT HEART CATHETERIZATION WITH CORONARY ANGIOGRAM N/A 11/12/2012   Procedure: LEFT HEART CATHETERIZATION WITH CORONARY ANGIOGRAM;  Surgeon: [name], MD;  Location: [facility] CATH LAB;  Service: Cardiovascular;  Laterality: N/A;  . LEFT HEART CATHETERIZATION WITH CORONARY ANGIOGRAM N/A 10/07/2013   Procedure: LEFT HEART CATHETERIZATION WITH CORONARY ANGIOGRAM;  Surgeon: [name], MD;  Location: [facility] CATH LAB;  Service: Cardiovascular;  Laterality: N/A;  . LEFT HEART CATHETERIZATION WITH CORONARY ANGIOGRAM N/A 12/14/2013   Procedure: LEFT HEART CATHETERIZATION WITH CORONARY ANGIOGRAM;  Surgeon: Sinclair Grooms, MD;  Location: [facility] CATH LAB;  Service: Cardiovascular;  Laterality: N/A;  . LEFT HEART CATHETERIZATION WITH CORONARY ANGIOGRAM N/A 05/16/2014   Procedure: LEFT HEART CATHETERIZATION WITH CORONARY ANGIOGRAM;  Surgeon: Sherren Mocha, MD;  Location: [facility] CATH LAB;  Service: Cardiovascular;  Laterality: N/A;  .  PERCUTANEOUS CORONARY INTERVENTION-BALLOON ONLY  08/04/2012   Procedure: PERCUTANEOUS CORONARY INTERVENTION-BALLOON ONLY;  Surgeon: [name], MD;  Location: [facility] CATH LAB;  Service: Cardiovascular;;  . PERCUTANEOUS CORONARY ROTOBLATOR INTERVENTION (PCI-R) N/A 08/11/2012   Procedure: PERCUTANEOUS CORONARY ROTOBLATOR INTERVENTION (PCI-R);  Surgeon: [name], MD;  Location: [facility] CATH LAB;  Service:  Cardiovascular;  Laterality: N/A;  . PERCUTANEOUS CORONARY STENT INTERVENTION (PCI-S)  10/07/2013   Procedure: PERCUTANEOUS CORONARY STENT INTERVENTION (PCI-S);  Surgeon: [name], MD;  Location: [facility] CATH LAB;  Service: Cardiovascular;;  . PERMANENT PACEMAKER INSERTION N/A 03/16/2012   [name] (SJM) leadless pacemaker (LEADLESS II STUDY PATEINT)  . SALIVARY GLAND SURGERY  2000's   "had a little lump removed; granulosa related; it was benign" (08/11/2012)  . TEE WITHOUT CARDIOVERSION  12/31/2011   Procedure: TRANSESOPHAGEAL ECHOCARDIOGRAM (TEE);  Surgeon: [name], MD;  Location: [place];  Service: Cardiovascular;  Laterality: N/A;  . VARICOSE VEIN SURGERY Bilateral 1977     Allergies  Allergies  Allergen Reactions  . Bee Venom Anaphylaxis  . Other Nausea And Vomiting and Other (See Comments)    Pain medications cause severe vomiting. Tolerated slow IV morphine drip    History of Present Illness    Mr. Mancel Bale is an 82 year old female with past medical history of permanent A. fib on Coumadin s/p St. Jude PPM for junctional rhythm, CAD with stents 2 to LAD and times one to the left circumflex in 2000, rotational arthrectomy to pCX/pLAD and DES to LAD 2014 and DES to Cx 2014, along with DES to LAD for re-in-stent stenosis in 2015, and DES to oLAD in 2016.   2-D echo on 05/14/14 showed EF of 45-50%, with diffuse hypokinesis and mildly dilated left atrium.                              She was seen in the office on 10/01/2015 by Nell Range where she reported ongoing chest pain. Stated that her husband had recently passed that she was under a lot of stress surrounding this event. Reported that she was using sublingual nitroglycerin more often. Also reported being really fatigued at that visit. Plan was made to increase her Imdur from 120 mg 280 mg and follow-up in the office.  Most recently she was seen in the A. fib clinic by Roderic Palau where she was noted to have  elevated be rates associated with her permanent A. fib and a low blood pressure. She reported that the increase Imdur did not seem to have significantly helped her symptoms. In that she was continuing to take sublingual nitroglycerin. At this visit she did report intermittent palpitations with associated chest pain. It was felt that her chest pain was related to her elevated V rates. Plan was made to add 30 mg Cardizem for patient to use when she is aware of her increased heart rate and chest pain. She was instructed to take Cardizem instead of several nitroglycerin which seemed to be dropping her blood pressure. At that time her Imdur was decreased back to 120 mg daily to give more room for blood pressure. She was instructed to follow-up in one week.         Since this visit states she has been taking her 30 mg Cardizem at least twice a day, and has reduced her submental nitroglycerin use to only 4 tablets in the past 6 days. He tells me that she was originally  on 240 mg of Cardizem daily, which was reduced to 180 mg, and then further reduced to 120 mg daily related to her blood pressure. Does report having chest pressure mostly with exertional activity, but this morning developed this while at rest, along with some dyspnea. She again noted to me she has been struggling with the loss of her husband back in July. Reports she is not been eating well, and her activity level has decreased. States she was walking regularly and was a part of Silver sneakers, but most recently has stopped this activity.  In the ED her labs showed stable electrolytes, trop neg x2, Hgb 12.1, INR 3.07 and CXR negative.  EKG showed 2:1 atrial flutter, with rate of 112. She is currently without chest pain/pressure at the time of exam.                                                                                                                   Home Medications    Prior to Admission medications   Medication Sig Start Date End Date  Taking? Authorizing Provider  atorvastatin (LIPITOR) 80 MG tablet TAKE 1 TABLET BY MOUTH EVERY MORNING Patient taking differently: TAKE 80 MG BY MOUTH EVERY MORNING 09/12/15  Yes [name], MD  beta carotene w/minerals (OCUVITE) tablet Take 1 tablet by mouth daily.    Yes Historical Provider, MD  Calcium Carb-Cholecalciferol (CALTRATE 600+D) 600-800 MG-UNIT TABS Take 1 tablet by mouth every morning.    Yes Historical Provider, MD  clopidogrel (PLAVIX) 75 MG tablet TAKE 1 TABLET BY MOUTH EVERY DAY WITH BREAKFAST Patient taking differently: TAKE 75 MG BY MOUTH EVERY DAY WITH BREAKFAST 05/07/15  Yes [name], MD  Coenzyme Q-10 100 MG capsule Take 200 mg by mouth at bedtime.    Yes Historical Provider, MD  diltiazem (CARDIZEM CD) 120 MG 24 hr capsule Take 1 capsule (120 mg total) by mouth daily. 06/22/15  Yes Sherran Needs, NP  diltiazem (CARDIZEM) 30 MG tablet Take 1 tablet by mouth every 4 hours AS NEEDED for heart rate >100 as long as blood pressure >100. 12/06/15  Yes Sherran Needs, NP  furosemide (LASIX) 40 MG tablet Take 80 mg by mouth daily.   Yes Historical Provider, MD  isosorbide mononitrate (IMDUR) 120 MG 24 hr tablet Take 1 tablet (120 mg total) by mouth daily. 12/06/15  Yes Sherran Needs, NP  KLOR-CON M20 20 MEQ tablet TAKE 1 TABLET (20 MEQ TOTAL) BY MOUTH DAILY. 11/29/15  Yes [name], MD  lisinopril (PRINIVIL,ZESTRIL) 5 MG tablet TAKE 1 TABLET BY MOUTH 2 TIMES DAILY Patient taking differently: TAKE 5 MG BY MOUTH 2 TIMES DAILY 09/12/15  Yes [name], MD  metoprolol (LOPRESSOR) 100 MG tablet TAKE 1 TABLET (100 MG TOTAL) BY MOUTH 2 (TWO) TIMES DAILY. 11/29/15  Yes Thompson Grayer, MD  Multiple Vitamin (MULTIVITAMIN) capsule Take 1 capsule by mouth daily.   Yes Historical Provider, MD  nitroGLYCERIN (NITROSTAT) 0.4 MG SL tablet PLACE  1 TABLET (0.4 MG TOTAL) UNDER THE TONGUE EVERY 5 (FIVE) MINUTES AS NEEDED. FOR CHEST PAIN 11/14/15  Yes [name],  MD  Omega-3 Fatty Acids (FISH OIL PO) Take 1,200 mg by mouth daily.    Yes Historical Provider, MD  spironolactone (ALDACTONE) 25 MG tablet Take 25 mg by mouth daily.  05/21/15  Yes [name], MD  warfarin (COUMADIN) 2 MG tablet TAKE 0.5-1 TABLETS (1-2 MG TOTAL) BY MOUTH DAILY AS DIRECTED Patient taking differently: Take 1 mg by mouth on Monday, Wednesday and Friday. Take 2 mg by mouth on all other days. 07/23/15  Yes [name], MD    Family History    Family History  Problem Relation Age of Onset  . Stroke Mother   . Heart attack Father   . Diabetes Father   . Hypertension Father   . Heart attack Brother   . Diabetes Brother   . Hypertension Brother   . Kidney failure Brother     Social History    Social History   Social History  . Marital status: Married    Spouse name: N/A  . Number of children: N/A  . Years of education: N/A   Occupational History  . Retired    Social History Main Topics  . Smoking status: Former Smoker    Packs/day: 1.00    Years: 32.00    Types: Cigarettes    Quit date: 02/03/1974  . Smokeless tobacco: Never Used  . Alcohol use 3.0 oz/week    5 Glasses of wine per week     Comment: 11/12/2012 "4oz wine w/dinner 5 days/wk"  . Drug use: No  . Sexual activity: No   Other Topics Concern  . Not on file   Social History Narrative   Lives with family.     Review of Systems    General:  No chills, fever, night sweats or weight changes.  Cardiovascular:  See HPI Dermatological: No rash, lesions/masses Respiratory: No cough, ++ dyspnea Urologic: No hematuria, dysuria Abdominal:   No nausea, vomiting, diarrhea, bright red blood per rectum, melena, or hematemesis Neurologic:  No visual changes, wkns, changes in mental status. All other systems reviewed and are otherwise negative except as noted above.  Physical Exam    Blood pressure 113/83, pulse 120, temperature 98.1 F (36.7 C), temperature source Oral, resp. rate 22,  height 5' (1.524 m), weight 173 lb (78.5 kg), SpO2 97 %.  General: Pleasant older WF, NAD Psych: Normal affect. Neuro: Alert and oriented X 3. Moves all extremities spontaneously. HEENT: Normal  Neck: Supple without bruits or JVD. Lungs:  Resp regular and unlabored, CTA. Heart: Irreg Irreg no s3, s4, or murmurs. Abdomen: Soft, non-tender, non-distended, BS + x 4.  Extremities: No clubbing, cyanosis or edema. DP/PT/Radials 2+ and equal bilaterally.  Labs    Troponin (Point of Care Test)  Recent Labs  12/13/15 1121  TROPIPOC 0.03   No results for input(s): CKTOTAL, CKMB, TROPONINI in the last 72 hours. Lab Results  Component Value Date   WBC 7.9 12/13/2015   HGB 12.1 12/13/2015   HCT 35.8 (L) 12/13/2015   MCV 95.2 12/13/2015   PLT 329 12/13/2015    Recent Labs Lab 12/13/15 0815  NA 134*  K 4.4  CL 101  CO2 24  BUN 37*  CREATININE 1.53*  CALCIUM 8.6*  PROT 6.5  BILITOT 0.9  ALKPHOS 71  ALT 27  AST 32  GLUCOSE 116*   Lab Results  Component Value Date   CHOL 136 01/02/2015   HDL 62 01/02/2015   LDLCALC 61 01/02/2015   TRIG 67 01/02/2015   No results found for: [facility]   Radiology Studies    Dg Chest Port 1 View  Result Date: 12/13/2015 CLINICAL DATA:  Mid chest pain, epigastric pain EXAM: PORTABLE CHEST 1 VIEW COMPARISON:  Chest x-ray of 05/17/2015 FINDINGS: No active infiltrate or effusion is seen. The lungs are slightly hyperaerated. Mediastinal and hilar contours are unremarkable and cardiomegaly is stable. IMPRESSION: Stable cardiomegaly.  No active lung disease. Electronically Signed   By: Ivar Drape M.D.   On: 12/13/2015 08:34    ECG & Cardiac Imaging    EKG: 2:1 Atrial Flutter Rate 112   Echo: 4/16  Study Conclusions  - Left ventricle: Abnormal septal motion The cavity size was normal. Wall thickness was increased in a pattern of moderate LVH. Systolic function was mildly reduced. The estimated ejection fraction was in the range of 45%  to 50%. Diffuse hypokinesis. - Left atrium: The atrium was mildly dilated.  Assessment & Plan    81 year old female with past medical history of CAD s/p multiple stents, systolic heart failure, A. Fib s/p PPM, hypertension, hyperlipidemia who presented complaining of chest pressure over the past couple of weeks.   1. Chest pressure/dyspnea: Most recent visits in the office she reported ongoing angina, we have attempted to adjust her medications with what seems like little improvement. Most recent visit to the AF clinic she was started on 30mg  Cardizem tabs to take in addition to her long acting dose for when her HR and blood pressure are elevated. Has taken a couple doses of this, and reduced her SL nitro use, but still having on-going exertional angina.  -- Last cath in '16 with DES placed to LAD, with noted 75% 2nd OM with 75%. Given attempts to treat on-going symptoms without much improvement, and noted residual disease would like to proceed with repeat cardiac cath.  -- INR 3.07, will need to hold coumadin. Discussed with Dr. Burt Knack and will give 2.5mg  vitamin in attempts to bring down INR for possible cath tomorrow.   2. AF: Rates noted to be elevated this admission. She has been using 30mg  Cardizem at home q4hrs for elevated HR. Currently on 120mg  daily, seems we need to attempt to increase dose for better rate control. Also on metoprolol 100mg  BID.  -- Will hold ACEi to allow for more blood pressure, and increase cardizem to 240mg  daily -- This patients CHA2DS2-VASc Score and unadjusted Ischemic Stroke Rate (% per year) is equal to 7.2 % stroke rate/year from a score of 5 Above score calculated as 1 point each if present [CHF, HTN, DM, Vascular=MI/PAD/Aortic Plaque, Age if 65-74, or Female] Above score calculated as 2 points each if present [Age > 75, or Stroke/TIA/TE] -- Will hold coumadin in anticipation of cardiac cath.   3. HTN: Stable, changes as noted above  4. Systolic HF: Does not  appear volume overloaded on exam. Will hold lasix as Cr is slightly above baseline.   5. HLD: On high dose statin  Barnet Pall, NP-C Pager [phone] 12/13/2015, 1:07 PM  Patient seen, examined. Available data reviewed. Agree with findings, assessment, and plan as outlined by Reino Bellis, NP-C. the patient is continually interviewed and examined. I participated in establishing this patient's plan of care as outlined above. On exam, the patient is alert and oriented, in no distress. Lungs are clear bilaterally, heart is irregularly  irregular and tachycardic. Abdomen soft and nontender, extremities are without edema. Otherwise as outlined above. The patient has symptoms typical of unstable angina but also is having issues with heart rate control of her chronic atrial fibrillation. We discussed treatment considerations at length. Will adjust medications as follows:  Hold lisinopril to allow blood pressure rise  Increase Cardizem to 240 mg daily  Considering the patient's multiple PCI procedures, ostial/proximal LAD stent last year, and crescendo symptoms of angina, I have recommended cardiac catheterization and possible PCI. I have reviewed the risks, indications, and alternatives to cardiac catheterization, possible angioplasty, and stenting with the patient. Risks include but are not limited to bleeding, infection, vascular injury, stroke, myocardial infection, arrhythmia, kidney injury, radiation-related injury in the case of prolonged fluoroscopy use, emergency cardiac surgery, and death. The patient understands the risks of serious complication is 1-2 in [id number] with diagnostic cardiac cath and 1-2% or less with angioplasty/stenting.   The patient is on chronic warfarin therapy. Her INR is 3.0 today. We'll give vitamin K 2.5 mg orally. Repeat INR tomorrow. Hopefully she will be at a point where catheterization can be done tomorrow. If not she will likely have to wait until Monday.  We'll cover her with heparin when her INR is less than 2.0.  Sherren Mocha, M.D. 12/13/2015 4:04 PM

## 2015-12-13 NOTE — Progress Notes (Addendum)
Patient has arrived on unit from ED.  Patient oriented to unit, assessed, placed on tele, VS were stable at baseline .   Amission RN called.   Asked ER RN to send 1430 dose of Vit. K. Never received med and med not given. Contacting pharmacy.

## 2015-12-14 ENCOUNTER — Encounter (HOSPITAL_COMMUNITY)
Admission: EM | Disposition: A | Discharge status: Home / Self Care | Payer: Self-pay | Source: Home / Self Care | Attending: Cardiovascular Disease

## 2015-12-14 DIAGNOSIS — I2510 Atherosclerotic heart disease of native coronary artery without angina pectoris: Secondary | ICD-10-CM

## 2015-12-14 DIAGNOSIS — R072 Precordial pain: Secondary | ICD-10-CM

## 2015-12-14 HISTORY — PX: CARDIAC CATHETERIZATION: SHX172

## 2015-12-14 LAB — BASIC METABOLIC PANEL
Anion gap: 9 (ref 5–15)
BUN: 30 mg/dL — AB (ref 6–20)
CHLORIDE: 102 mmol/L (ref 101–111)
CO2: 25 mmol/L (ref 22–32)
Calcium: 8.6 mg/dL — ABNORMAL LOW (ref 8.9–10.3)
Creatinine, Ser: 1.32 mg/dL — ABNORMAL HIGH (ref 0.44–1.00)
GFR calc Af Amer: 42 mL/min — ABNORMAL LOW (ref >60)
GFR calc non Af Amer: 36 mL/min — ABNORMAL LOW (ref >60)
GLUCOSE: 111 mg/dL — AB (ref 65–99)
POTASSIUM: 4.3 mmol/L (ref 3.5–5.1)
Sodium: 136 mmol/L (ref 135–145)

## 2015-12-14 LAB — CBC
HEMATOCRIT: 35.4 % — AB (ref 36.0–46.0)
Hemoglobin: 12 g/dL (ref 12.0–15.0)
MCH: 32.3 pg (ref 26.0–34.0)
MCHC: 33.9 g/dL (ref 30.0–36.0)
MCV: 95.2 fL (ref 78.0–100.0)
Platelets: 343 10*3/uL (ref 150–400)
RBC: 3.72 MIL/uL — ABNORMAL LOW (ref 3.87–5.11)
RDW: 14.5 % (ref 11.5–15.5)
WBC: 9.4 10*3/uL (ref 4.0–10.5)

## 2015-12-14 LAB — TROPONIN I
TROPONIN I: 0.06 ng/mL — AB (ref <0.03)
Troponin I: 0.1 ng/mL (ref <0.03)

## 2015-12-14 LAB — PROTIME-INR
INR: 1.92
Prothrombin Time: 22.2 seconds — ABNORMAL HIGH (ref 11.4–15.2)

## 2015-12-14 SURGERY — LEFT HEART CATH AND CORONARY ANGIOGRAPHY
Anesthesia: LOCAL

## 2015-12-14 MED ORDER — MIDAZOLAM HCL 2 MG/2ML IJ SOLN
INTRAMUSCULAR | Status: AC
  Filled 2015-12-14: qty 2

## 2015-12-14 MED ORDER — MIDAZOLAM HCL 2 MG/2ML IJ SOLN
INTRAMUSCULAR | Status: DC | PRN
  Administered 2015-12-14: 1 mg via INTRAVENOUS

## 2015-12-14 MED ORDER — HEPARIN (PORCINE) IN NACL 2-0.9 UNIT/ML-% IJ SOLN
INTRAMUSCULAR | Status: AC
  Filled 2015-12-14: qty 1000

## 2015-12-14 MED ORDER — HEPARIN (PORCINE) IN NACL 2-0.9 UNIT/ML-% IJ SOLN
INTRAMUSCULAR | Status: DC | PRN
  Administered 2015-12-14: 1000 mL

## 2015-12-14 MED ORDER — HEPARIN SODIUM (PORCINE) 1000 UNIT/ML IJ SOLN
INTRAMUSCULAR | Status: DC | PRN
  Administered 2015-12-14: 4000 [IU] via INTRAVENOUS

## 2015-12-14 MED ORDER — LIDOCAINE HCL (PF) 1 % IJ SOLN
INTRAMUSCULAR | Status: AC
  Filled 2015-12-14: qty 30

## 2015-12-14 MED ORDER — LIDOCAINE HCL (PF) 1 % IJ SOLN
INTRAMUSCULAR | Status: DC | PRN
  Administered 2015-12-14: 2 mL

## 2015-12-14 MED ORDER — FENTANYL CITRATE (PF) 100 MCG/2ML IJ SOLN
INTRAMUSCULAR | Status: DC | PRN
  Administered 2015-12-14: 25 ug via INTRAVENOUS

## 2015-12-14 MED ORDER — FENTANYL CITRATE (PF) 100 MCG/2ML IJ SOLN
INTRAMUSCULAR | Status: AC
  Filled 2015-12-14: qty 2

## 2015-12-14 MED ORDER — SODIUM CHLORIDE 0.9 % IV SOLN: 250.0000 mL | INTRAVENOUS | Status: DC | PRN

## 2015-12-14 MED ORDER — VERAPAMIL HCL 2.5 MG/ML IV SOLN
INTRAVENOUS | Status: DC | PRN
  Administered 2015-12-14: 15:00:00 via INTRA_ARTERIAL

## 2015-12-14 MED ORDER — IOPAMIDOL (ISOVUE-370) INJECTION 76%
INTRAVENOUS | Status: DC | PRN
  Administered 2015-12-14: 60 mL via INTRA_ARTERIAL

## 2015-12-14 MED ORDER — SODIUM CHLORIDE 0.9 % IV SOLN
INTRAVENOUS | Status: AC
  Administered 2015-12-14: 16:00:00 via INTRAVENOUS

## 2015-12-14 MED ORDER — WARFARIN SODIUM 2 MG PO TABS
2.0000 mg | ORAL_TABLET | Freq: Once | ORAL | Status: AC
  Administered 2015-12-14: 2 mg via ORAL
  Filled 2015-12-14: qty 1

## 2015-12-14 MED ORDER — IOPAMIDOL (ISOVUE-370) INJECTION 76%
INTRAVENOUS | Status: AC
  Filled 2015-12-14: qty 100

## 2015-12-14 MED ORDER — HEPARIN (PORCINE) IN NACL 100-0.45 UNIT/ML-% IJ SOLN
1000.0000 [IU]/h | INTRAMUSCULAR | Status: DC
  Administered 2015-12-14: 1000 [IU]/h via INTRAVENOUS
  Filled 2015-12-14: qty 250

## 2015-12-14 MED ORDER — SODIUM CHLORIDE 0.9% FLUSH: 3.0000 mL | Freq: Two times a day (BID) | INTRAVENOUS | Status: DC

## 2015-12-14 MED ORDER — SODIUM CHLORIDE 0.9% FLUSH: 3.0000 mL | INTRAVENOUS | Status: DC | PRN

## 2015-12-14 MED ORDER — VERAPAMIL HCL 2.5 MG/ML IV SOLN
INTRAVENOUS | Status: AC
  Filled 2015-12-14: qty 2

## 2015-12-14 MED ORDER — HEPARIN SODIUM (PORCINE) 1000 UNIT/ML IJ SOLN
INTRAMUSCULAR | Status: AC
  Filled 2015-12-14: qty 1

## 2015-12-14 MED ORDER — WARFARIN - PHARMACIST DOSING INPATIENT: Freq: Every day | Status: DC

## 2015-12-14 SURGICAL SUPPLY — 9 items
CATH 5FR JL3.5 JR4 ANG PIG MP (CATHETERS) ×2 IMPLANT
DEVICE RAD COMP TR BAND LRG (VASCULAR PRODUCTS) ×2 IMPLANT
GLIDESHEATH SLEND SS 6F .021 (SHEATH) ×2 IMPLANT
GUIDEWIRE INQWIRE 1.5J.035X260 (WIRE) ×1 IMPLANT
INQWIRE 1.5J .035X260CM (WIRE) ×2
KIT HEART LEFT (KITS) ×2 IMPLANT
PACK CARDIAC CATHETERIZATION (CUSTOM PROCEDURE TRAY) ×2 IMPLANT
TRANSDUCER W/STOPCOCK (MISCELLANEOUS) ×2 IMPLANT
TUBING CIL FLEX 10 FLL-RA (TUBING) ×2 IMPLANT

## 2015-12-14 NOTE — Progress Notes (Signed)
ANTICOAGULATION CONSULT NOTE - Initial Consult  Pharmacy Consult for heparin Indication: chest pain/ACS and atrial fibrillation   Assessment: 82 year old female with history of afib on coumadin prior to admission. Concern for ACS and coumadin is being reversed for cath this afternoon. Vitamin k given yesterday. INR down to 1.9 this morning. New orders to start IV heparin, will not bolus given INR. CBC stable.   Goal of Therapy:  INR 2-3 Heparin level 0.3-0.7 units/ml Monitor platelets by anticoagulation protocol: Yes   Plan:  Start heparin at 1000 units/hr Check 8 hour heparin level if not gone to cath Follow up restart of warfarin post cath as appropriate  Allergies  Allergen Reactions  . Bee Venom Anaphylaxis  . Other Nausea And Vomiting and Other (See Comments)    Pain medications cause severe vomiting. Tolerated slow IV morphine drip    Patient Measurements: Height: 5' (152.4 cm) Weight: 173 lb (78.5 kg) IBW/kg (Calculated) : 45.5 Heparin Dosing Weight: 63  Vital Signs: Temp: 98 F (36.7 C) (11/10 0434) Temp Source: Oral (11/10 0434) BP: 91/54 (11/10 0434) Pulse Rate: 90 (11/10 0434)  Labs:  Recent Labs  12/13/15 0815 12/13/15 2048 12/14/15 0200 12/14/15 0751  HGB 12.1  --   --  12.0  HCT 35.8*  --   --  35.4*  PLT 329  --   --  343  LABPROT 32.4*  --   --  22.2*  INR 3.07  --   --  1.92  CREATININE 1.53*  --   --  1.32*  TROPONINI  --  0.10* 0.10* 0.06*    Estimated Creatinine Clearance: 29.9 mL/min (by C-G formula based on SCr of 1.32 mg/dL (H)).   Medical History: Past Medical History:  Diagnosis Date  . Chronic anticoagulation - coumadin, CHADS2VASC=6 05/17/2015  . Combined systolic and diastolic heart failure, NYHA class 3 ([place])   . Coronary artery disease    a. s/p multiple stents  b. LHC 12/14/13 with sig CAD with TO of OM1, 70-80% occusion OM2, 50-70% mLCx, 50-70% pLAD (recent FFR 0.83), diffusely diseased RCA, widely patent stetns in pLAD  and LCx.  . Diverticulitis   . Granulosa cell carcinoma ([place])    abd; last episode was in 2009  . Hyperlipidemia   . Hypertension   . LBBB (left bundle branch block)       . Myocardial infarction 2002  . Obesity   . OSA on CPAP   . Pacemaker - St Jude Leadless PPM   . Peripheral vascular disease ([place])   . Permanent atrial fibrillation ([place]) 2013  . Renal artery stenosis ([place])   . Tachycardia-bradycardia syndrome ([place])    a. s/p leadless pacemaker ([name]) implanted by Dr [name]  . Varicose veins    Erin Hearing PharmD., BCPS Clinical Pharmacist Pager [phone] 12/14/2015 9:53 AM

## 2015-12-14 NOTE — Progress Notes (Signed)
[place] for heparin and warfarin Indication: chest pain/ACS and atrial fibrillation   Assessment: 82 year old female with history of afib on coumadin prior to admission. Coumadin reversed on 11/9 for cath today, INR down to 1.9 this morning. S/p cath on 11/10 that revealed triple vessel CAD with plans for medical management. Pharmacy to resume warfarin this evening and heparin infusion 8 hours post sheath removal.   Goal of Therapy:  INR 2-3 Heparin level 0.3-0.7 units/ml Monitor platelets by anticoagulation protocol: Yes   Plan:  - Resume heparin at 1000 units/hr at 23:00 tonight  - Check 8 hour heparin level  - Warfarin 2 mg x 1 tonight   Allergies  Allergen Reactions  . Bee Venom Anaphylaxis  . Other Nausea And Vomiting and Other (See Comments)    Pain medications cause severe vomiting. Tolerated slow IV morphine drip    Patient Measurements: Height: 5' (152.4 cm) Weight: 173 lb (78.5 kg) IBW/kg (Calculated) : 45.5 Heparin Dosing Weight: 63  Vital Signs: Temp: 98.1 F (36.7 C) (11/10 1347) Temp Source: Oral (11/10 1347) BP: 100/55 (11/10 1453) Pulse Rate: 86 (11/10 1453)  Labs:  Recent Labs  12/13/15 0815 12/13/15 2048 12/14/15 0200 12/14/15 0751  HGB 12.1  --   --  12.0  HCT 35.8*  --   --  35.4*  PLT 329  --   --  343  LABPROT 32.4*  --   --  22.2*  INR 3.07  --   --  1.92  CREATININE 1.53*  --   --  1.32*  TROPONINI  --  0.10* 0.10* 0.06*    Estimated Creatinine Clearance: 29.9 mL/min (by C-G formula based on SCr of 1.32 mg/dL (H)).   Medical History: Past Medical History:  Diagnosis Date  . Chronic anticoagulation - coumadin, CHADS2VASC=6 05/17/2015  . Combined systolic and diastolic heart failure, NYHA class 3 ([place])   . Coronary artery disease    a. s/p multiple stents  b. LHC 12/14/13 with sig CAD with TO of OM1, 70-80% occusion OM2, 50-70% mLCx, 50-70% pLAD (recent FFR 0.83), diffusely diseased RCA, widely  patent stetns in pLAD and LCx.  . Diverticulitis   . Granulosa cell carcinoma ([place])    abd; last episode was in 2009  . Hyperlipidemia   . Hypertension   . LBBB (left bundle branch block)       . Myocardial infarction 2002  . Obesity   . OSA on CPAP   . Pacemaker - St Jude Leadless PPM   . Peripheral vascular disease ([place])   . Permanent atrial fibrillation ([place]) 2013  . Renal artery stenosis ([place])   . Tachycardia-bradycardia syndrome ([place])    a. s/p leadless pacemaker ([name]) implanted by Dr [name]  . Varicose veins    Vincenza Hews, PharmD, BCPS 12/14/2015, 3:51 PM Pager: [phone]

## 2015-12-14 NOTE — Interval H&P Note (Signed)
History and Physical Interval Note:  12/14/2015 2:21 PM  Patty [name]  has presented today for cardiac cath with the diagnosis of Chest Pain  The various methods of treatment have been discussed with the patient and family. After consideration of risks, benefits and other options for treatment, the patient has consented to  Procedure(s): Left Heart Cath and Coronary Angiography (N/A) as a surgical intervention .  The patient's history has been reviewed, patient examined, no change in status, stable for surgery.  I have reviewed the patient's chart and labs.  Questions were answered to the patient's satisfaction.    Cath Lab Visit (complete for each Cath Lab visit)  Clinical Evaluation Leading to the Procedure:   ACS: Yes.    Non-ACS:    Anginal Classification: CCS II  Anti-ischemic medical therapy: Maximal Therapy (2 or more classes of medications)  Non-Invasive Test Results: No non-invasive testing performed  Prior CABG: No previous CABG         Patty [name]

## 2015-12-14 NOTE — Progress Notes (Signed)
   The plane yesterday was to give 2.5 mg of vitamin K. The plan was to proceed with catheterization today if renal function and INR are acceptable. Creatinine is improved at 1.3. The INR is down to 1.9. It is my understanding that these numbers will probably be okay to proceed with catheterization today. The final decision will be up to the cathing doctor on our team.  Daryel November, MD

## 2015-12-14 NOTE — Consult Note (Signed)
            [facility] CM Primary Care Navigator  12/14/2015  Rudell Naumann [place]ut 1930/12/10 [id number]  Went to see patient at the bedside to identify discharge needs but nurse states that patient just went for cardiac catheterization. She may or may not come back to the unit as stated. If stent will be placed, she will be transferred to another unit per nurse.  Will attempt to follow-up with patient at another time.          For additional questions please contact:  Edwena Felty A. Vesna Kable, BSN, RN-BC [facility] PRIMARY CARE Navigator Cell: [phone]

## 2015-12-15 ENCOUNTER — Encounter (HOSPITAL_COMMUNITY): Payer: Self-pay | Admitting: Physician Assistant

## 2015-12-15 DIAGNOSIS — I4821 Permanent atrial fibrillation: Secondary | ICD-10-CM | POA: Diagnosis present

## 2015-12-15 DIAGNOSIS — R748 Abnormal levels of other serum enzymes: Secondary | ICD-10-CM

## 2015-12-15 DIAGNOSIS — I248 Other forms of acute ischemic heart disease: Secondary | ICD-10-CM | POA: Diagnosis present

## 2015-12-15 LAB — BASIC METABOLIC PANEL
Anion gap: 8 (ref 5–15)
BUN: 25 mg/dL — ABNORMAL HIGH (ref 6–20)
CALCIUM: 8.4 mg/dL — AB (ref 8.9–10.3)
CO2: 24 mmol/L (ref 22–32)
CREATININE: 1.32 mg/dL — AB (ref 0.44–1.00)
Chloride: 104 mmol/L (ref 101–111)
GFR calc non Af Amer: 36 mL/min — ABNORMAL LOW (ref >60)
GFR, EST AFRICAN AMERICAN: 42 mL/min — AB (ref >60)
Glucose, Bld: 121 mg/dL — ABNORMAL HIGH (ref 65–99)
Potassium: 4.2 mmol/L (ref 3.5–5.1)
SODIUM: 136 mmol/L (ref 135–145)

## 2015-12-15 LAB — PROTIME-INR
INR: 1.8
PROTHROMBIN TIME: 21.2 s — AB (ref 11.4–15.2)

## 2015-12-15 LAB — CBC
HCT: 32.8 % — ABNORMAL LOW (ref 36.0–46.0)
HEMOGLOBIN: 10.7 g/dL — AB (ref 12.0–15.0)
MCH: 31.2 pg (ref 26.0–34.0)
MCHC: 32.6 g/dL (ref 30.0–36.0)
MCV: 95.6 fL (ref 78.0–100.0)
Platelets: 333 10*3/uL (ref 150–400)
RBC: 3.43 MIL/uL — ABNORMAL LOW (ref 3.87–5.11)
RDW: 14.3 % (ref 11.5–15.5)
WBC: 10.7 10*3/uL — ABNORMAL HIGH (ref 4.0–10.5)

## 2015-12-15 LAB — HEPARIN LEVEL (UNFRACTIONATED): HEPARIN UNFRACTIONATED: 0.39 [IU]/mL (ref 0.30–0.70)

## 2015-12-15 MED ORDER — DILTIAZEM HCL ER COATED BEADS 300 MG PO CP24: 300.0000 mg | ORAL_CAPSULE | Freq: Every day | ORAL | 5 refills | Status: DC

## 2015-12-15 MED ORDER — ISOSORBIDE MONONITRATE ER 60 MG PO TB24: 60.0000 mg | ORAL_TABLET | Freq: Every day | ORAL | 5 refills | Status: DC

## 2015-12-15 MED ORDER — DILTIAZEM HCL ER COATED BEADS 120 MG PO CP24: 300.0000 mg | ORAL_CAPSULE | Freq: Every day | ORAL | Status: DC

## 2015-12-15 MED ORDER — ISOSORBIDE MONONITRATE ER 60 MG PO TB24: 60.0000 mg | ORAL_TABLET | Freq: Every day | ORAL | Status: DC

## 2015-12-15 MED ORDER — WARFARIN SODIUM 2 MG PO TABS: 2.0000 mg | ORAL_TABLET | Freq: Once | ORAL | Status: DC

## 2015-12-15 NOTE — Discharge Summary (Signed)
Discharge Summary    Patient ID: Patty [name]  MRN: [id number], DOB/AGE: 06/13/1930 82 y.o.  Admit Date: 12/13/2015 Discharge Date: 12/15/2015  Primary Care Provider: Kandice Hams, MD Primary Cardiologist: Dr. Irish Lack, MD  Discharge Diagnoses    Principal Problem:   Permanent atrial fibrillation with RVR [facility]) Active Problems:   CAD (coronary artery disease)   Demand ischemia ([name])   Chest pain   Chronic anticoagulation - coumadin, CHADS2VASC=6   HTN (hypertension)   Hyperlipidemia   Tachycardia-bradycardia syndrome ([place])   Pacemaker - St Jude Leadless PPM   Allergies Allergies  Allergen Reactions  . Bee Venom Anaphylaxis  . Other Nausea And Vomiting and Other (See Comments)    Pain medications cause severe vomiting. Tolerated slow IV morphine drip     History of Present Illness     82 year old female with history of CAD with stenting x 2 to the LAD and x 1 to the LCx in 2000, rotational arthrectomy to proximal LCx and DES to LAD in 2014 and DES to LCx in 2014, along with DES to LAD for re-in-stent stenosis in 2015, and DES to ostial LAD in 2016. She also has history of permanent Afib on Coumadin s/p SJM PPM for junctional rhythm, chronic combined CHF, RAS, PVD, obesity, OSA on CPAP, LBBB, HTN, and HLD who presented to [facility] on 12/13/2015 with chest pressure for the past couple of weeks.   2-D echo on 05/14/2014 showed an EF of 45-50% with diffuse hypokinesis and a mildly dilated left atrium. She was seen in the office on 10/01/2015 with reported ongoing chest pain. Her husband had recently passed and she was under a lot of stress surrounding that event. She reported using SL NTG more often and being easily fatigued. She was medically managed at that time with increase in her Imdur from 120 mg to 280 mg with planned outpatient follow up. Most recently she was seen in the Afib clinic where she was noted to have elevated be rates associated with her permanent Afib and a low  blood pressure. She reported that the increase Imdur did not seem to have significantly helped her symptoms. In that she was continuing to take SL NTG. At that visit she did report intermittent palpitations with associated chest pain. It was felt that her chest pain was related to her elevated V rates. Plan was made to add 30 mg Cardizem for patient to use when she is aware of her increased heart rate and chest pain. She was instructed to take Cardizem instead of several nitroglycerin which seemed to be dropping her blood pressure. At that time her Imdur was decreased back to 120 mg daily to give more room for blood pressure. Since that visit with the Afib clinic she had been taking her prn 30 mg Cardizem at least twice daily and had reduced her SL NTG use to only 4 tabs in the preceding 6 days. She noted she had previously been on Cardizem 240 mg, which was reduced to 180 mg, and then further reduced to 120 mg daily related to blood pressure. She reported having chest pressure mostly with exertional activity. However, on the morning of 11/9 she developed chest pressure while at rest along with dyspnea. She continued to note ongoing struggling with the loss of her husband in July. She had not been eating well and her activity level was noted to be decreased as she was previously walking regularly with Silver Sneakers, though recently had stopped walking.  Hospital Course     Consultants: pharmacy, [facility]  In the ED her labs showed stable electrolytes with troponin mildly elevated and flat trending at 0.10-->0.10-->0.06,  Hgb 12.1, INR 3.07, and CXR negative. EKG showed 2:1 atrial flutter with RVR, 112 bpm. Coumadin was held and she was given vitamin K 2.5 mg in an effort to bring down her INR for cardiac cath on 11/10. She was covered with heparin while her INR was less than 2.0. Her lisinopril was held upon admission to allow for more BP room to rate control her Afib. Her Cardizem was increased to 240 mg daily  upon admission. She underwent LHC on 11/10 that showed 3-vessel CAD with patent stents in the proximal and mid LAD with minimal stenosis. The mid and distal LAD had no obstructive disease. The LCx had a patent proximal stent with minimal restenosis. The first OM branch was occluded and filled from left to left collaterals. The second OM branch was a smaller caliber vessel with ostial 75% stenosis which was unchanged from last cardiac cath and was too small for PCI. The RCA was large dominant vessel with diffuse 30-40% calcific stenosis but no obstructive disease. There were no focal targets for PCI although she has diffuse CAD it was not surprising that she had a mildly elevated troponin in the setting of elevated heart rate with Afib due to demand ischemia. Continued medical therapy was advised.   Post cardiac cath her renal function remained stable at 1.32 (baseline approximately 1.3 it appears), potassium at goal at 4.2, Hgb 10.7, WBC 10.7, PLT 333, INR on day of discharge 1.80 (felt to be ok to discharge with subtherapeutic INR given no prior stroke with planned follow up at the Coumadin clinic on 11/13. In rounds on 11/11 following cardiac cath she had not had any further chest pain and denied any dyspnea. She was continued on Plavix and ASA was discontinued given need to Coumadin. She was continued on Lipitor and Lopressor. Regarding her Afib her Cardizem was further increased to 300 mg daily. She was noted to have borderline soft blood pressure. Given that her Cardizem was increased as above for added rate control of her Afib her Imdur was further decreased to 60 mg daily to allow for adequate blood pressure.   The patient's right radial cath site has been examined is healing well without issues at this time. The patient has been seen by Dr. Stanford Breed, MD and felt to be stable for discharge today. The office has been contacted to schedule TOC appointment in 1 week and follow up with Dr. Irish Lack, MD in 8  weeks. She will follow up with Coumadin Clinic to have her INR check on 12/19/2015 (message sent to [facility] office). Discharge medications are listed below. Prescriptions have been reviewed with the patient and sent in to their pharmacy.  _____________  Discharge Vitals Blood pressure (!) 106/51, pulse 83, temperature 98.3 F (36.8 C), temperature source Oral, resp. rate 18, height 5' (1.524 m), weight 173 lb (78.5 kg), SpO2 97 %.  Filed Weights   12/13/15 0801  Weight: 173 lb (78.5 kg)    Labs & Radiologic Studies    CBC  Recent Labs  12/14/15 0751 12/15/15 0316  WBC 9.4 10.7*  HGB 12.0 10.7*  HCT 35.4* 32.8*  MCV 95.2 95.6  PLT 343 0000000   Basic Metabolic Panel  Recent Labs  12/14/15 0751 12/15/15 0316  NA 136 136  K 4.3 4.2  CL 102 104  CO2  25 24  GLUCOSE 111* 121*  BUN 30* 25*  CREATININE 1.32* 1.32*  CALCIUM 8.6* 8.4*   Liver Function Tests  Recent Labs  12/13/15 0815  AST 32  ALT 27  ALKPHOS 71  BILITOT 0.9  PROT 6.5  ALBUMIN 3.0*   No results for input(s): LIPASE, AMYLASE in the last 72 hours. Cardiac Enzymes  Recent Labs  12/13/15 2048 12/14/15 0200 12/14/15 0751  TROPONINI 0.10* 0.10* 0.06*   BNP Invalid input(s): POCBNP D-Dimer No results for input(s): DDIMER in the last 72 hours. Hemoglobin A1C No results for input(s): HGBA1C in the last 72 hours. Fasting Lipid Panel No results for input(s): CHOL, HDL, LDLCALC, TRIG, CHOLHDL, LDLDIRECT in the last 72 hours. Thyroid Function Tests No results for input(s): TSH, T4TOTAL, T3FREE, THYROIDAB in the last 72 hours.  Invalid input(s): FREET3 _____________  Dg Chest Port 1 View  Result Date: 12/13/2015 CLINICAL DATA:  Mid chest pain, epigastric pain EXAM: PORTABLE CHEST 1 VIEW COMPARISON:  Chest x-ray of 05/17/2015 FINDINGS: No active infiltrate or effusion is seen. The lungs are slightly hyperaerated. Mediastinal and hilar contours are unremarkable and cardiomegaly is stable.  IMPRESSION: Stable cardiomegaly.  No active lung disease. Electronically Signed   By: Ivar Drape M.D.   On: 12/13/2015 08:34    Diagnostic Studies/Procedures   LHC 12/14/2015: Coronary Findings   Dominance: Right  Left Anterior Descending  Vessel is large.  Ost LAD to Mid LAD lesion, 20% stenosed. The lesion was previously treated using a stent (unknown type) at an unknown date.  First Diagonal Branch  Vessel is small in size.  Second Diagonal Branch  Vessel is small in size.  Third Diagonal Branch  Vessel is small in size.  Left Circumflex  Vessel is large.  Ost Cx to Prox Cx lesion, 10% stenosed. The lesion was previously treated using a stent (unknown type) at an unknown date.  Mid Cx lesion, 65% stenosed.  First Obtuse Marginal Branch  Vessel is moderate in size. 1st Mrg filled by collaterals from Dist LAD.  Ost 1st Mrg to 1st Mrg lesion, 100% stenosed. The lesion is chronically occluded.  Second Obtuse Marginal Branch  Vessel is small in size.  Ost 2nd Mrg to 2nd Mrg lesion, 75% stenosed.  Third Obtuse Marginal Branch  Vessel is moderate in size.  Right Coronary Artery  Prox RCA lesion, 30% stenosed. The lesion is calcified.  Mid RCA-1 lesion, 40% stenosed. The lesion is discrete. The lesion is calcified.  Mid RCA-2 lesion, 30% stenosed. The lesion is calcified.  Dist RCA lesion, 100% stenosed. The lesion is chronically occluded.  Right Posterior Descending Artery  Vessel is small in size.  Ost RPDA to RPDA lesion, 50% stenosed.  Coronary Diagrams   Diagnostic Diagram      _____________  Disposition   Pt is being discharged home today in good condition.  Follow-up Plans & Appointments    Message sent to [facility] office to schedule TOC in 1 week, follow up with Dr. Irish Lack, MD in 8 weeks and to schedule Coumadin clinic visit on 12/19/15. Patient advised to contact the office if she has not heard from them by 12/18/15.  Discharge Instructions    (HEART  FAILURE PATIENTS) Call MD:  Anytime you have any of the following symptoms: 1) 3 pound weight gain in 24 hours or 5 pounds in 1 week 2) shortness of breath, with or without a dry hacking cough 3) swelling in the hands, feet or stomach 4) if  you have to sleep on extra pillows at night in order to breathe.    Complete by:  As directed    AMB Referral to Phase II Cardiac Rehab    Complete by:  As directed    Diagnosis:  Stable Angina   Call MD for:  difficulty breathing, headache or visual disturbances    Complete by:  As directed    Call MD for:  extreme fatigue    Complete by:  As directed    Call MD for:  hives    Complete by:  As directed    Call MD for:  persistant dizziness or light-headedness    Complete by:  As directed    Call MD for:  persistant nausea and vomiting    Complete by:  As directed    Call MD for:  redness, tenderness, or signs of infection (pain, swelling, redness, odor or green/yellow discharge around incision site)    Complete by:  As directed    Call MD for:  severe uncontrolled pain    Complete by:  As directed    Call MD for:  temperature >100.4    Complete by:  As directed    Diet - low sodium heart healthy    Complete by:  As directed    Discharge instructions    Complete by:  As directed    1) Please do not take aspirin   2) Please contact the [employer] office at [phone] if you have not heard from them by 12/18/15 regarding your hospital follow up appointment (which is to be scheduled within 1 week of your discharge) as well as a Coumadin clinic visit that needs to be on 12/19/15.   Increase activity slowly    Complete by:  As directed       Discharge Medications   Current Discharge Medication List    CONTINUE these medications which have CHANGED   Details  diltiazem (CARDIZEM CD) 300 MG 24 hr capsule Take 1 capsule (300 mg total) by mouth daily. Qty: 30 capsule, Refills: 5    isosorbide mononitrate (IMDUR) 60 MG 24 hr tablet Take 1  tablet (60 mg total) by mouth daily. Qty: 30 tablet, Refills: 5      CONTINUE these medications which have NOT CHANGED   Details  atorvastatin (LIPITOR) 80 MG tablet TAKE 1 TABLET BY MOUTH EVERY MORNING Qty: 30 tablet, Refills: 8    beta carotene w/minerals (OCUVITE) tablet Take 1 tablet by mouth daily.     Calcium Carb-Cholecalciferol (CALTRATE 600+D) 600-800 MG-UNIT TABS Take 1 tablet by mouth every morning.     clopidogrel (PLAVIX) 75 MG tablet TAKE 1 TABLET BY MOUTH EVERY DAY WITH BREAKFAST Qty: 30 tablet, Refills: 11    Coenzyme Q-10 100 MG capsule Take 200 mg by mouth at bedtime.     diltiazem (CARDIZEM) 30 MG tablet Take 1 tablet by mouth every 4 hours AS NEEDED for heart rate >100 as long as blood pressure >100. Qty: 45 tablet, Refills: 3    furosemide (LASIX) 40 MG tablet Take 80 mg by mouth daily.    KLOR-CON M20 20 MEQ tablet TAKE 1 TABLET (20 MEQ TOTAL) BY MOUTH DAILY. Qty: 30 tablet, Refills: 9    metoprolol (LOPRESSOR) 100 MG tablet TAKE 1 TABLET (100 MG TOTAL) BY MOUTH 2 (TWO) TIMES DAILY. Qty: 60 tablet, Refills: 11    Multiple Vitamin (MULTIVITAMIN) capsule Take 1 capsule by mouth daily.    nitroGLYCERIN (NITROSTAT) 0.4 MG SL  tablet PLACE 1 TABLET (0.4 MG TOTAL) UNDER THE TONGUE EVERY 5 (FIVE) MINUTES AS NEEDED. FOR CHEST PAIN Qty: 25 tablet, Refills: 3    Omega-3 Fatty Acids (FISH OIL PO) Take 1,200 mg by mouth daily.     spironolactone (ALDACTONE) 25 MG tablet Take 25 mg by mouth daily.  Qty: 45 tablet, Refills: 11    warfarin (COUMADIN) 2 MG tablet TAKE 0.5-1 TABLETS (1-2 MG TOTAL) BY MOUTH DAILY AS DIRECTED Qty: 30 tablet, Refills: 3      STOP taking these medications     lisinopril (PRINIVIL,ZESTRIL) 5 MG tablet          Aspirin prescribed at discharge?  No: Stopped secondary to dual therapy with Plavix and Coumadin  High Intensity Statin Prescribed? (Lipitor 40-80mg  or Crestor 20-40mg ): Yes Beta Blocker Prescribed? Yes For EF <40%, was  ACEI/ARB Prescribed? No: EF > 40% ADP Receptor Inhibitor Prescribed? (i.e. Plavix etc.-Includes Medically Managed Patients): Yes For EF <40%, Aldosterone Inhibitor Prescribed? No: EF > 40% Was EF assessed during THIS hospitalization? No: Recent echo 05/2014 Was Cardiac Rehab II ordered? (Included Medically managed Patients): Yes   Outstanding Labs/Studies   Recommend follow [facility] at [facility] given mild leukocytosis and Hgb of 10.7.  Duration of Discharge Encounter   Greater than 30 minutes including physician time.  Gladstone Lighter [facility]  Pager: [phone] 12/15/2015, 12:26 PM

## 2015-12-15 NOTE — Progress Notes (Signed)
    Subjective:  Denies CP or dyspnea   Objective:  Vitals:   12/14/15 1600 12/14/15 1800 12/14/15 2026 12/15/15 0532  BP: (!) 108/55 116/63 (!) 110/54 (!) 106/51  Pulse: 68 64 76 83  Resp:  20 20 18   Temp:   98.2 F (36.8 C) 98.3 F (36.8 C)  TempSrc:   Oral Oral  SpO2:   96% 97%  Weight:      Height:        Intake/Output from previous day:  Intake/Output Summary (Last 24 hours) at 12/15/15 1036 Last data filed at 12/14/15 1839  Gross per 24 hour  Intake              200 ml  Output                0 ml  Net              200 ml    Physical Exam: Physical exam: Well-developed well-nourished in no acute distress.  Skin is warm and dry.  HEENT is normal.  Neck is supple.  Chest is clear to auscultation with normal expansion.  Cardiovascular exam is irregular Abdominal exam nontender or distended. No masses palpated. Extremities show no edema. Radial cath site with no hematoma. neuro grossly intact    Lab Results: Basic Metabolic Panel:  Recent Labs  12/14/15 0751 12/15/15 0316  NA 136 136  K 4.3 4.2  CL 102 104  CO2 25 24  GLUCOSE 111* 121*  BUN 30* 25*  CREATININE 1.32* 1.32*  CALCIUM 8.6* 8.4*   CBC:  Recent Labs  12/14/15 0751 12/15/15 0316  WBC 9.4 10.7*  HGB 12.0 10.7*  HCT 35.4* 32.8*  MCV 95.2 95.6  PLT 343 333   Cardiac Enzymes:  Recent Labs  12/13/15 2048 12/14/15 0200 12/14/15 0751  TROPONINI 0.10* 0.10* 0.06*     Assessment/Plan:  1 chest pain-cath results noted. Plan medical therapy. Continue Plavix. Discontinue aspirin given need for Coumadin. Continue statin and beta blocker.  2 atrial fibrillation-rate upper normal. Continue metoprolol. Increase Cardizem to 300 mg daily. Coumadin has been resumed. She will need her INR checked Wednesday in the [employer] office. No history of CVA and therefore do not think INR needs to be therapeutic prior to DC.  3. Hypertension-blood pressure is borderline. I'm increasing  Cardizem for improved rate control. I will therefore decrease isosorbide to 60 mg daily.  4 prior pacemaker  DC today and fu with Dr Irish Lack in 8 weeks; TOC appt one week > 30 min PA and physician time D2  Kirk Ruths 12/15/2015, 10:36 AM

## 2015-12-15 NOTE — Progress Notes (Addendum)
[place] for heparin and warfarin Indication: chest pain/ACS and atrial fibrillation   Assessment: 82 year old female with history of afib on coumadin prior to admission. Coumadin reversed on 11/9 for cath 11/10.  INR down to 1.8 this morning after resuming post cath. Heparin also resumed and levels this am were at goal. No bleeding complications noted overnight.   Goal of Therapy:  INR 2-3 Heparin level 0.3-0.7 units/ml Monitor platelets by anticoagulation protocol: Yes   Plan:  - Continue heparin at 1000 units/hr - Recommend resuming patient's home dose of warfarin at discharge  Allergies  Allergen Reactions  . Bee Venom Anaphylaxis  . Other Nausea And Vomiting and Other (See Comments)    Pain medications cause severe vomiting. Tolerated slow IV morphine drip    Patient Measurements: Height: 5' (152.4 cm) Weight: 173 lb (78.5 kg) IBW/kg (Calculated) : 45.5 Heparin Dosing Weight: 63  Vital Signs: Temp: 98.3 F (36.8 C) (11/11 0532) Temp Source: Oral (11/11 0532) BP: 106/51 (11/11 0532) Pulse Rate: 83 (11/11 0532)  Labs:  Recent Labs  12/13/15 0815 12/13/15 2048 12/14/15 0200 12/14/15 0751 12/15/15 0316 12/15/15 0716  HGB 12.1  --   --  12.0 10.7*  --   HCT 35.8*  --   --  35.4* 32.8*  --   PLT 329  --   --  343 333  --   LABPROT 32.4*  --   --  22.2* 21.2*  --   INR 3.07  --   --  1.92 1.80  --   HEPARINUNFRC  --   --   --   --   --  0.39  CREATININE 1.53*  --   --  1.32* 1.32*  --   TROPONINI  --  0.10* 0.10* 0.06*  --   --     Estimated Creatinine Clearance: 29.9 mL/min (by C-G formula based on SCr of 1.32 mg/dL (H)).   Medical History: Past Medical History:  Diagnosis Date  . Chronic anticoagulation - coumadin, CHADS2VASC=6 05/17/2015  . Combined systolic and diastolic heart failure, NYHA class 3 ([place])   . Coronary artery disease    a. s/p multiple stents  b. LHC 12/14/13 with sig CAD with TO of OM1, 70-80%  occusion OM2, 50-70% mLCx, 50-70% pLAD (recent FFR 0.83), diffusely diseased RCA, widely patent stetns in pLAD and LCx.  . Diverticulitis   . Granulosa cell carcinoma ([place])    abd; last episode was in 2009  . Hyperlipidemia   . Hypertension   . LBBB (left bundle branch block)       . Myocardial infarction 2002  . Obesity   . OSA on CPAP   . Pacemaker - St Jude Leadless PPM   . Peripheral vascular disease ([place])   . Permanent atrial fibrillation ([place]) 2013  . Renal artery stenosis ([place])   . Tachycardia-bradycardia syndrome ([place])    a. s/p leadless pacemaker ([name]) implanted by Dr [name]  . Varicose veins    Erin Hearing PharmD., BCPS Clinical Pharmacist Pager [phone] 12/15/2015 10:30 AM

## 2015-12-15 NOTE — Discharge Instructions (Signed)

## 2015-12-15 NOTE — Progress Notes (Signed)
Discharged to home with family office visits in place teaching done  

## 2015-12-16 ENCOUNTER — Telehealth: Payer: Self-pay | Admitting: Nurse Practitioner

## 2015-12-16 NOTE — Telephone Encounter (Signed)
   Pt was d/c'd yesterday after admission for c/p.  She has permanent afib and rates were up so dilt was increased from 120 daily to 300 daily prior to d/c.  Cath was performed and showed nonobs dzs.  She has yet to have the 300 mg dose of dilt filled and so this AM, she took two 120 mg tablets and two short acting, 30 mg tablets.  She ate breakfast and then afterward felt nauseated.  She took pepto bismol w/ relief of nausea.  She isn't sure what her BP was during the nausea but it was 130 prior to meds this AM.  I suspect that the 60 mg of short acting dilt may have played a role in acutely dropping her BP, though it's difficult to tell.  I rec that she get the 300 mg tab filled (HR was 115 prior to meds this AM).  She may take pepto bismol prn for nausea.  Caller verbalized understanding and was grateful for the call back.  Murray Hodgkins, NP 12/16/2015, 1:12 PM

## 2015-12-17 ENCOUNTER — Other Ambulatory Visit (HOSPITAL_COMMUNITY): Payer: Self-pay | Admitting: *Deleted

## 2015-12-17 ENCOUNTER — Encounter (HOSPITAL_COMMUNITY): Payer: Self-pay | Admitting: Cardiovascular Disease

## 2015-12-17 ENCOUNTER — Telehealth: Payer: Self-pay | Admitting: Interventional Cardiology

## 2015-12-17 MED ORDER — DILTIAZEM HCL ER COATED BEADS 120 MG PO CP24: 120.0000 mg | ORAL_CAPSULE | Freq: Every day | ORAL | Status: DC

## 2015-12-17 MED ORDER — DILTIAZEM HCL ER COATED BEADS 180 MG PO CP24: 180.0000 mg | ORAL_CAPSULE | Freq: Every day | ORAL | 6 refills | Status: DC

## 2015-12-17 NOTE — Telephone Encounter (Signed)
Discussed below with Roderic Palau NP recommended taking Cardizem 180mg  in the morning and cardizem 120mg  in the evening. (The patient tolerated both doses in the past without nausea.) She will start this tomorrow as she had taken 300mg  this morning of cardizem.  Pt was in agreement with plan.

## 2015-12-17 NOTE — Telephone Encounter (Signed)
I also completed TOC phone call and scheduled the pt for 1 week follow-up office visit and INR check.

## 2015-12-17 NOTE — Consult Note (Signed)
            [facility] CM Primary Care Navigator  12/17/2015  Patty [name] 1930/09/22 [id number]    Wentto see patient in room to identify possible discharge needs butpatient was already discharged.  Primary care provider's office contacted (Bonnie)to notify of patient's discharge and need for post hospital follow-up and transition of care. Made aware to refer patient to [facility] care management for care coordination needs if deemed appropriate  For additional questions please contact:  Edwena Felty A. Kenzleigh Sedam, BSN, RN-BC [facility] PRIMARY CARE Navigator Cell: [phone]

## 2015-12-17 NOTE — Telephone Encounter (Signed)
Pt is calling to speak directly to a nurse she states she is having  A reation to the new medication prescribed

## 2015-12-17 NOTE — Telephone Encounter (Signed)
25 min phone call.   I spoke with the pt and she complains of nausea with Diltiazem.  The pt was hospitalized last week due to chest pain. Prior to admission the pt had adjustment in diltiazem by Roderic Palau NP. While hospitalized the pt's AFib was not controlled so her medications were adjusted.  The pt was taking Diltiazem 120mg  on admission and at discharge this was increased to 300mg  daily, lisinopril was stopped and Imdur was decreased. The pt took dosage on Sunday and had to contact on call staff due to nausea.  Ignacia Bayley NP spoke with the pt and documented note in chart.  Today the pt took Diltiazem and nausea occurred again. The pt said she cannot tolerate nausea and that her medications need to be reviewed and adjusted ASAP.  I advised the pt that I will have to have a provider review this information and make further recommendations.   Yesterday 131/70, 125 Today: Took diltiazem at 8:00 8 AM 129/83, 120 9:30 134/57, 77 nauseated took pepto bismol 11:30 113/68, 96 still nauseated

## 2015-12-18 ENCOUNTER — Telehealth (HOSPITAL_COMMUNITY): Payer: Self-pay | Admitting: *Deleted

## 2015-12-18 NOTE — Telephone Encounter (Signed)
OK with me.

## 2015-12-18 NOTE — Telephone Encounter (Signed)
Pt called back this afternoon stating with the 180mg  of cardizem this morning she still had nausea. Her heart rate was controlled around 102 BP 110/73. She does not want to take the cardizem 120mg  tonight due to fear of overmedicating and feeling worse. Pt will watch her heart rate and will probably only take 120mg  of cardizem tomorrow morning prior to her follow up appointment. Pt very worried of her quality of life with this new addition of nausea. Reassured pt the right medication regimen would be accomplished just may need some work to control heart rate and her be able to tolerate the medication without nausea. Pt was appreciative of advice and will call if further issues.

## 2015-12-19 ENCOUNTER — Ambulatory Visit (INDEPENDENT_AMBULATORY_CARE_PROVIDER_SITE_OTHER): Payer: Medicare Other | Admitting: Physician Assistant

## 2015-12-19 ENCOUNTER — Encounter: Payer: Self-pay | Admitting: Physician Assistant

## 2015-12-19 ENCOUNTER — Ambulatory Visit (INDEPENDENT_AMBULATORY_CARE_PROVIDER_SITE_OTHER): Payer: Medicare Other | Admitting: *Deleted

## 2015-12-19 ENCOUNTER — Other Ambulatory Visit: Payer: Self-pay | Admitting: Oncology

## 2015-12-19 VITALS — BP 142/82 | HR 124 | Ht 60.0 in | Wt 167.4 lb

## 2015-12-19 DIAGNOSIS — I481 Persistent atrial fibrillation: Secondary | ICD-10-CM

## 2015-12-19 DIAGNOSIS — Z7901 Long term (current) use of anticoagulants: Secondary | ICD-10-CM

## 2015-12-19 DIAGNOSIS — I5042 Chronic combined systolic (congestive) and diastolic (congestive) heart failure: Secondary | ICD-10-CM

## 2015-12-19 DIAGNOSIS — I4891 Unspecified atrial fibrillation: Secondary | ICD-10-CM

## 2015-12-19 DIAGNOSIS — I4819 Other persistent atrial fibrillation: Secondary | ICD-10-CM

## 2015-12-19 DIAGNOSIS — I10 Essential (primary) hypertension: Secondary | ICD-10-CM | POA: Diagnosis not present

## 2015-12-19 DIAGNOSIS — I25118 Atherosclerotic heart disease of native coronary artery with other forms of angina pectoris: Secondary | ICD-10-CM

## 2015-12-19 DIAGNOSIS — Z5181 Encounter for therapeutic drug level monitoring: Secondary | ICD-10-CM

## 2015-12-19 DIAGNOSIS — C569 Malignant neoplasm of unspecified ovary: Secondary | ICD-10-CM

## 2015-12-19 DIAGNOSIS — E785 Hyperlipidemia, unspecified: Secondary | ICD-10-CM

## 2015-12-19 LAB — HEPATIC FUNCTION PANEL
ALBUMIN: 3.5 g/dL — AB (ref 3.6–5.1)
ALT: 50 U/L — ABNORMAL HIGH (ref 6–29)
AST: 51 U/L — AB (ref 10–35)
Alkaline Phosphatase: 83 U/L (ref 33–130)
BILIRUBIN TOTAL: 0.7 mg/dL (ref 0.2–1.2)
Bilirubin, Direct: 0.2 mg/dL (ref <=0.2)
Indirect Bilirubin: 0.5 mg/dL (ref 0.2–1.2)
Total Protein: 6.7 g/dL (ref 6.1–8.1)

## 2015-12-19 LAB — CBC WITH DIFFERENTIAL/PLATELET
BASOS ABS: 0 {cells}/uL (ref 0–200)
Basophils Relative: 0 %
Eosinophils Absolute: 258 cells/uL (ref 15–500)
Eosinophils Relative: 3 %
HEMATOCRIT: 36.9 % (ref 35.0–45.0)
HEMOGLOBIN: 12.5 g/dL (ref 11.7–15.5)
LYMPHS ABS: 1376 {cells}/uL (ref 850–3900)
Lymphocytes Relative: 16 %
MCH: 31.4 pg (ref 27.0–33.0)
MCHC: 33.9 g/dL (ref 32.0–36.0)
MCV: 92.7 fL (ref 80.0–100.0)
MONO ABS: 1118 {cells}/uL — AB (ref 200–950)
MPV: 8.8 fL (ref 7.5–12.5)
Monocytes Relative: 13 %
NEUTROS PCT: 68 %
Neutro Abs: 5848 cells/uL (ref 1500–7800)
Platelets: 432 10*3/uL — ABNORMAL HIGH (ref 140–400)
RBC: 3.98 MIL/uL (ref 3.80–5.10)
RDW: 14.1 % (ref 11.0–15.0)
WBC: 8.6 10*3/uL (ref 3.8–10.8)

## 2015-12-19 LAB — BASIC METABOLIC PANEL
BUN: 28 mg/dL — AB (ref 7–25)
CALCIUM: 9.3 mg/dL (ref 8.6–10.4)
CO2: 30 mmol/L (ref 20–31)
Chloride: 99 mmol/L (ref 98–110)
Creat: 1.39 mg/dL — ABNORMAL HIGH (ref 0.60–0.88)
GLUCOSE: 106 mg/dL — AB (ref 65–99)
Potassium: 4.8 mmol/L (ref 3.5–5.3)
Sodium: 137 mmol/L (ref 135–146)

## 2015-12-19 LAB — POCT INR: INR: 3.4

## 2015-12-19 LAB — TSH: TSH: 3.95 m[IU]/L

## 2015-12-19 MED ORDER — DILTIAZEM HCL ER COATED BEADS 300 MG PO CP24: 300.0000 mg | ORAL_CAPSULE | Freq: Every day | ORAL | 3 refills | Status: DC

## 2015-12-19 MED ORDER — ONDANSETRON HCL 4 MG PO TABS: 4.0000 mg | ORAL_TABLET | Freq: Three times a day (TID) | ORAL | 0 refills | Status: DC | PRN

## 2015-12-19 MED ORDER — DILTIAZEM HCL 30 MG PO TABS: ORAL_TABLET | ORAL | 3 refills | Status: DC

## 2015-12-19 NOTE — Addendum Note (Signed)
Addended by: Gaetano Net on: 12/19/2015 02:16 PM   Modules accepted: Orders

## 2015-12-19 NOTE — Patient Instructions (Addendum)
Medication Instructions:  Your physician has recommended you make the following change in your medication:  1.  START Dilitazem 300 mg taking 1 tablet daily (STOP the Other diltiazem other than the 30 mg as needed)   Labwork: TODAY:  BMET, CBC W/ DIFF, HEPATIC, & TSH  Testing/Procedures: None ordered  Follow-Up: Your physician recommends that you schedule a follow-up appointment in: [place].  Any Other Special Instructions Will Be Listed Below (If Applicable).     If you need a refill on your cardiac medications before your next appointment, please call your pharmacy.

## 2015-12-19 NOTE — Progress Notes (Signed)
Cardiology Office Note    Date:  12/19/2015   ID:  Patty [name], DOB 1930/02/06, MRN [id number]  PCP:  Kandice Hams, MD  Cardiologist:  Dr. Irish Lack   CC: post hospital follow up  History of Present Illness:  Patty [name] is a 82 y.o. female with a history of CAD s/p multiple stents, LBBB, granulosa cell carcinoma (diagnosed 2002), chronic combined s/d CHF, PVD, HTN, HLD, tachy-brady s/p leadless PPM, permanent AFib/flutter on coumadin and OSA on CPAP who presents to clinic for post hospital follow up.   She has a significant CAD history with chronic chest pain. She has significant CAD with stenting x 2 to the LAD and x 1 to the LCx in 2000, rotational arthrectomy to proximal LCx and DES to LAD in 2014 and DES to LCx in 2014, along with DES to LAD for re-in-stent stenosis in 2015, and DES to ostial LAD in 2016.  2D echo on 05/14/2014 showed an EF of 45-50% with diffuse hypokinesis and a mildly dilated left atrium. She was seen in the office on 10/01/2015 with reported ongoing chest pain. Her husband had recently passed and she was under a lot of stress surrounding that event. She reported using SL NTG more often and being easily fatigued. She was medically managed at that time with increase in her Imdur from 120 mg to 180 mg with planned outpatient follow up. Then seen in the Afib clinic where she was noted to have elevated rates associated with her permanent Afib and a low blood pressure. She reported that the increase Imdur did not seem to have significantly helped her symptoms and that she was continuing to take SL NTG. At that visit she did report intermittent palpitations with associated chest pain. It was felt that her chest pain was related to her elevated V rates. Plan was made to add 30 mg Cardizem for patient to use when she is aware of her increased heart rate and chest pain. She was instructed to take Cardizem instead of several nitroglycerin which seemed to be dropping her  blood pressure. At that time her Imdur was decreased back to 120 mg daily to give more room for blood pressure. Since that visit with the Afib clinic she had been taking her prn 30 mg Cardizem at least twice daily and had reduced her SL NTG use.  She was then admitted from 11/9-11/11/17 for chest pain. She had mildly elevated troponin with flat trend (0.10-->0.10-->0.06). EKG showed 2:1 atrial flutter with RVR, 112 bpm. Coumadin was held and she was given vitamin K 2.5 mg in an effort to bring down her INR for cardiac cath on 11/10. Her lisinopril was held upon admission to allow for more BP room to rate control her Afib. Her Cardizem was increased to 240 mg daily upon admission. She underwent LHC on 11/10 that showed 3-vessel CAD with patent stents in the proximal and mid LAD with minimal stenosis. The mid and distal LAD had no obstructive disease. The LCx had a patent proximal stent with minimal restenosis. The first OM branch was occluded and filled from left to left collaterals. The second OM branch was a smaller caliber vessel with ostial 75% stenosis which was unchanged from last cardiac cath and was too small for PCI. The RCA was large dominant vessel with diffuse 30-40% calcific stenosis but no obstructive disease. There were no focal targets for PCI although she has diffuse CAD it was not surprising that she had a mildly elevated  troponin in the setting of elevated heart rate with Afib due to demand ischemia. Continued medical therapy was advised.  She was continued on Plavix and ASA was discontinued given need to Coumadin. She was continued on Lipitor and Lopressor. Regarding her Afib her Cardizem was further increased to 300 mg daily. She was noted to have borderline soft blood pressure. Given that her Cardizem was increased as above for added rate control of her Afib her Imdur was further decreased to 60 mg daily to allow for adequate blood pressure.   She called in feeling nauseated on Dilt 300mg   daily. This was changed to Cardizem 180mg  in the morning and cardizem 120mg  in the evening. Then she was told to only take 120 this AM.   Today she presents to clinic for follow up. Her HR has been elevated since last night. She took 120mg  daily this AM. Nausea has not improved. Previously tolerated Cardizem 240 mg daily for years. Says when she took the 300mg  Cardizem CD that her HR was in the 80s and SBP 112. She has not had any chest pain or SOB. No LE edema, orthopnea or PND. No dizziness or syncope. No blood in her stool or urine. Just feels lousy and nauseated. Also has a runny nose. Attributes some of her recent problems to stress.   Past Medical History:  Diagnosis Date  . Chronic anticoagulation - coumadin, CHADS2VASC=6 05/17/2015  . Combined systolic and diastolic heart failure, NYHA class 3 ([place])   . Coronary artery disease    a. s/p multiple stents; b. LHC 12/14/13 with sig CAD with TO of OM1, 70-80% occusion OM2, 50-70% mLCx, 50-70% pLAD (recent FFR 0.83), diffusely diseased RCA, widely patent stetns in pLAD and LCx; c. [facility] 12/14/15 w/ 3v CAD, patent LAD & LCx stents, OM2 75% small & unchanged, RCA diff 30-40 w/o obs dz, med Rx  . Diverticulitis   . Granulosa cell carcinoma ([place])    abd; last episode was in 2009  . Hyperlipidemia   . Hypertension   . LBBB (left bundle branch block)       . Myocardial infarction 2002  . Obesity   . OSA on CPAP   . Pacemaker - St Jude Leadless PPM   . Peripheral vascular disease ([place])   . Permanent atrial fibrillation ([place]) 2013  . Renal artery stenosis ([place])   . Tachycardia-bradycardia syndrome ([place])    a. s/p leadless pacemaker ([name]) implanted by Dr [name]  . Varicose veins     Past Surgical History:  Procedure Laterality Date  . ABDOMINAL HYSTERECTOMY    . CARDIAC CATHETERIZATION  09/03/2007   EF 70%; Failed attempt at PCI to OM  . CARDIAC CATHETERIZATION  11/01/2003   EF 70%  . CARDIAC CATHETERIZATION N/A 12/14/2015   Procedure:  Left Heart Cath and Coronary Angiography;  Surgeon: Burnell Blanks, MD;  Location: [place] CV LAB;  Service: Cardiovascular;  Laterality: N/A;  . CARDIOVERSION  12/31/2011   Procedure: CARDIOVERSION;  Surgeon: [name], MD;  Location: [facility] ENDOSCOPY;  Service: Cardiovascular;  Laterality: N/A;  . CARDIOVERSION N/A 12/31/2011   Procedure: CARDIOVERSION;  Surgeon: [name], MD;  Location: [facility] CATH LAB;  Service: Cardiovascular;  Laterality: N/A;  . CATARACT EXTRACTION, BILATERAL  2015  . CHOLECYSTECTOMY  1980's  . COLON SURGERY  2004   colectomy for diverticulosis  . CORONARY ANGIOPLASTY WITH STENT PLACEMENT  2000; 08/11/2012; 11/12/2012   3 + 2 LAD & CFX; 2nd CFX stent 11/12/2012  .  FRACTIONAL FLOW RESERVE WIRE  10/07/2013   Procedure: FRACTIONAL FLOW RESERVE WIRE;  Surgeon: [name], MD;  Location: [facility] CATH LAB;  Service: Cardiovascular;;  . granulosa tumor excision  2000; 2003; 2004; 2007   "all in my abdomen including small intestines, outside my ?uterus/etc" (08/11/2012)  . HERNIA REPAIR  2005   "laparoscopic"  . LEFT HEART CATHETERIZATION WITH CORONARY ANGIOGRAM N/A 11/12/2012   Procedure: LEFT HEART CATHETERIZATION WITH CORONARY ANGIOGRAM;  Surgeon: [name], MD;  Location: [facility] CATH LAB;  Service: Cardiovascular;  Laterality: N/A;  . LEFT HEART CATHETERIZATION WITH CORONARY ANGIOGRAM N/A 10/07/2013   Procedure: LEFT HEART CATHETERIZATION WITH CORONARY ANGIOGRAM;  Surgeon: [name], MD;  Location: [facility] CATH LAB;  Service: Cardiovascular;  Laterality: N/A;  . LEFT HEART CATHETERIZATION WITH CORONARY ANGIOGRAM N/A 12/14/2013   Procedure: LEFT HEART CATHETERIZATION WITH CORONARY ANGIOGRAM;  Surgeon: Sinclair Grooms, MD;  Location: [facility] CATH LAB;  Service: Cardiovascular;  Laterality: N/A;  . LEFT HEART CATHETERIZATION WITH CORONARY ANGIOGRAM N/A 05/16/2014   Procedure: LEFT HEART CATHETERIZATION WITH CORONARY ANGIOGRAM;  Surgeon: Sherren Mocha, MD;  Location: [facility] CATH LAB;  Service: Cardiovascular;  Laterality: N/A;  . PERCUTANEOUS CORONARY INTERVENTION-BALLOON ONLY  08/04/2012   Procedure: PERCUTANEOUS CORONARY INTERVENTION-BALLOON ONLY;  Surgeon: [name], MD;  Location: [facility] CATH LAB;  Service: Cardiovascular;;  . PERCUTANEOUS CORONARY ROTOBLATOR INTERVENTION (PCI-R) N/A 08/11/2012   Procedure: PERCUTANEOUS CORONARY ROTOBLATOR INTERVENTION (PCI-R);  Surgeon: [name], MD;  Location: [facility] CATH LAB;  Service: Cardiovascular;  Laterality: N/A;  . PERCUTANEOUS CORONARY STENT INTERVENTION (PCI-S)  10/07/2013   Procedure: PERCUTANEOUS CORONARY STENT INTERVENTION (PCI-S);  Surgeon: [name], MD;  Location: [facility] CATH LAB;  Service: Cardiovascular;;  . PERMANENT PACEMAKER INSERTION N/A 03/16/2012   [name] (SJM) leadless pacemaker (LEADLESS II STUDY PATEINT)  . SALIVARY GLAND SURGERY  2000's   "had a little lump removed; granulosa related; it was benign" (08/11/2012)  . TEE WITHOUT CARDIOVERSION  12/31/2011   Procedure: TRANSESOPHAGEAL ECHOCARDIOGRAM (TEE);  Surgeon: [name], MD;  Location: [place];  Service: Cardiovascular;  Laterality: N/A;  . VARICOSE VEIN SURGERY Bilateral 1977    Current Medications: Outpatient Medications Prior to Visit  Medication Sig Dispense Refill  . beta carotene w/minerals (OCUVITE) tablet Take 1 tablet by mouth daily.     . Calcium Carb-Cholecalciferol (CALTRATE 600+D) 600-800 MG-UNIT TABS Take 1 tablet by mouth every morning.     . Coenzyme Q-10 100 MG capsule Take 200 mg by mouth at bedtime.     . furosemide (LASIX) 40 MG tablet Take 80 mg by mouth daily.    . isosorbide mononitrate (IMDUR) 60 MG 24 hr tablet Take 1 tablet (60 mg total) by mouth daily. 30 tablet 5  . KLOR-CON M20 20 MEQ tablet TAKE 1 TABLET (20 MEQ TOTAL) BY MOUTH DAILY. 30 tablet 9  . metoprolol (LOPRESSOR) 100 MG tablet TAKE 1 TABLET (100 MG TOTAL) BY MOUTH 2 (TWO) TIMES DAILY. 60 tablet 11  .  Multiple Vitamin (MULTIVITAMIN) capsule Take 1 capsule by mouth daily.    . nitroGLYCERIN (NITROSTAT) 0.4 MG SL tablet PLACE 1 TABLET (0.4 MG TOTAL) UNDER THE TONGUE EVERY 5 (FIVE) MINUTES AS NEEDED. FOR CHEST PAIN 25 tablet 3  . Omega-3 Fatty Acids (FISH OIL PO) Take 1,200 mg by mouth daily.     Marland Kitchen spironolactone (ALDACTONE) 25 MG tablet Take 25 mg by mouth daily.  45 tablet 11  . warfarin (COUMADIN) 2 MG  tablet TAKE 0.5-1 TABLETS (1-2 MG TOTAL) BY MOUTH DAILY AS DIRECTED (Patient taking differently: Take 1 mg by mouth on Monday, Wednesday and Friday. Take 2 mg by mouth on all other days.) 30 tablet 3  . diltiazem (CARDIZEM CD) 120 MG 24 hr capsule Take 1 capsule (120 mg total) by mouth at bedtime.    Marland Kitchen diltiazem (CARDIZEM CD) 180 MG 24 hr capsule Take 1 capsule (180 mg total) by mouth daily after breakfast. 30 capsule 6  . diltiazem (CARDIZEM) 30 MG tablet Take 1 tablet by mouth every 4 hours AS NEEDED for heart rate >100 as long as blood pressure >100. 45 tablet 3  . atorvastatin (LIPITOR) 80 MG tablet TAKE 1 TABLET BY MOUTH EVERY MORNING (Patient not taking: Reported on 12/19/2015) 30 tablet 8  . clopidogrel (PLAVIX) 75 MG tablet TAKE 1 TABLET BY MOUTH EVERY DAY WITH BREAKFAST (Patient not taking: Reported on 12/19/2015) 30 tablet 11   No facility-administered medications prior to visit.      Allergies:   Bee venom and Other   Social History   Social History  . Marital status: Married    Spouse name: N/A  . Number of children: N/A  . Years of education: N/A   Occupational History  . Retired    Social History Main Topics  . Smoking status: Former Smoker    Packs/day: 1.00    Years: 32.00    Types: Cigarettes    Quit date: 02/03/1974  . Smokeless tobacco: Never Used  . Alcohol use 3.0 oz/week    5 Glasses of wine per week     Comment: 2017 WINE WITH DINNER   . Drug use: No  . Sexual activity: No   Other Topics Concern  . None   Social History Narrative   Lives with family.       Family History:  The patient's family history includes Diabetes in her brother and father; Heart attack in her brother and father; Hypertension in her brother and father; Kidney failure in her brother; Stroke in her mother.     ROS:   Please see the history of present illness.    ROS All other systems reviewed and are negative.   PHYSICAL EXAM:   VS:  BP (!) 142/82   Pulse (!) 124   Ht 5' (1.524 m)   Wt 167 lb 6.4 oz (75.9 kg)   SpO2 99%   BMI 32.69 kg/m    GEN: Well nourished, well developed, in no acute distress, obese HEENT: normal  Neck: no JVD, carotid bruits, or masses Cardiac: irreg irreg, tachy; no murmurs, rubs, or gallops,no edema  Respiratory:  clear to auscultation bilaterally, normal work of breathing GI: soft, nontender, nondistended, + BS MS: no deformity or atrophy  Skin: warm and dry, no rash Neuro:  Alert and Oriented x 3, Strength and sensation are intact Psych: euthymic mood, full affect  Wt Readings from Last 3 Encounters:  12/19/15 167 lb 6.4 oz (75.9 kg)  12/13/15 173 lb (78.5 kg)  12/06/15 173 lb 9.6 oz (78.7 kg)      Studies/Labs Reviewed:   EKG:  EKG is NOT ordered today.   Recent Labs: 05/17/2015: B Natriuretic Peptide 129.9; TSH 2.942 12/13/2015: ALT 27 12/15/2015: BUN 25; Creatinine, Ser 1.32; Hemoglobin 10.7; Platelets 333; Potassium 4.2; Sodium 136   Lipid Panel    Component Value Date/Time   CHOL 136 01/02/2015 0847   TRIG 67 01/02/2015 0847   HDL 62 01/02/2015 0847  CHOLHDL 2.2 01/02/2015 0847   VLDL 13 01/02/2015 0847   LDLCALC 61 01/02/2015 0847    Additional studies/ records that were reviewed today include:  LHC 12/14/2015: Coronary Findings  Dominance: Right  Left Anterior Descending  Vessel is large.  Ost LAD to Mid LAD lesion, 20% stenosed. The lesion was previously treated using a stent (unknown type) at an unknown date.  First Diagonal Branch  Vessel is small in size.  Second Diagonal Branch  Vessel is  small in size.  Third Diagonal Branch  Vessel is small in size.  Left Circumflex  Vessel is large.  Ost Cx to Prox Cx lesion, 10% stenosed. The lesion was previously treated using a stent (unknown type) at an unknown date.  Mid Cx lesion, 65% stenosed.  First Obtuse Marginal Branch  Vessel is moderate in size. 1st Mrg filled by collaterals from Dist LAD.  Ost 1st Mrg to 1st Mrg lesion, 100% stenosed. The lesion is chronically occluded.  Second Obtuse Marginal Branch  Vessel is small in size.  Ost 2nd Mrg to 2nd Mrg lesion, 75% stenosed.  Third Obtuse Marginal Branch  Vessel is moderate in size.  Right Coronary Artery  Prox RCA lesion, 30% stenosed. The lesion is calcified.  Mid RCA-1 lesion, 40% stenosed. The lesion is discrete. The lesion is calcified.  Mid RCA-2 lesion, 30% stenosed. The lesion is calcified.  Dist RCA lesion, 100% stenosed. The lesion is chronically occluded.  Right Posterior Descending Artery  Vessel is small in size.  Ost RPDA to RPDA lesion, 50% stenosed.  Coronary Diagrams   Diagnostic Diagram      _____________  2D ECHO: 05/14/2014 LV EF: 45% -  50% Study Conclusions - Left ventricle: Abnormal septal motion The cavity size was normal. Wall thickness was increased in a pattern of moderate LVH. Systolic function was mildly reduced. The estimated ejection fraction was in the range of 45% to 50%. Diffuse hypokinesis. - Left atrium: The atrium was mildly dilated.  ASSESSMENT & PLAN:   Permanant atrial flutter/fibrillation/ afib with RVR: currently on Coumadin for anticoagulation for CHADSVASC of 6 (CHF, HTN, age, vasc dz, f sex). Continue metoprolol 100 mg twice a day. Her HR remains uncontrolled. She has remained nauseated despite decreasing Cardizem. She has tolerated at Cardizem CD 240mg  daily in the past. Therefore, I think nausea is unrelated to this medication. Her HR and BP were well controlled on Cardizem 300mg  daily so I have recommended  that she restart this.  In the meantime I will order some blood work to make sure she doesn't have evidence of a viral gastroenteritis and prescribe zofran for symptomatic relief. I will arrange close follow up with Laroy Apple NP in the afib clinic next week to make sure HR better controlled. If she remains nauseated and no other explanation, we may need to try another rate controlling agent.   CAD: LHC on 11/10 that showed diffuse 3V CAD with no focal targets for PCI. Continue with Plavix, BB and atorvastatin and imdur. No ASA as she is on plavix and coumadin.   Chest pain: this is chronic. Thankfully, she has not had anymore chest pain since her most recent admission.   Tachy-brady:s/p leadless pacemaker. Functioning well. Continue follow up with Dr. [name]  Hyperlipidemia: continuestatin.  Essential hypertension: well controlled on current medications  Obesity: continue to encourage weight loss.  Chronic systolic CHF: EF [id number] on 05/2014. Continue with BB and ACE, spiro and lasix. She appears euvolemic.    Granulosa  cell carcinoma (diagnosed 2002): in remission. Followed by Dr. Marko Plume   Medication Adjustments/Labs and Tests Ordered: Current medicines are reviewed at length with the patient today.  Concerns regarding medicines are outlined above.  Medication changes, Labs and Tests ordered today are listed in the Patient Instructions below. Patient Instructions  Medication Instructions:  Your physician has recommended you make the following change in your medication:  1.  START Dilitazem 300 mg taking 1 tablet daily (STOP the Other diltiazem other than the 30 mg as needed)   Labwork: TODAY:  BMET, CBC W/ DIFF, HEPATIC, & TSH  Testing/Procedures: None ordered  Follow-Up: Your physician recommends that you schedule a follow-up appointment in: [place].  Any Other Special Instructions Will Be Listed Below (If  Applicable).     If you need a refill on your cardiac medications before your next appointment, please call your pharmacy.      Signed, Angelena Form, PA-C  12/19/2015 12:27 PM    [facility] [address], [place], [place]  [address] Phone: [phone]; Fax: [phone]

## 2015-12-20 ENCOUNTER — Ambulatory Visit (HOSPITAL_BASED_OUTPATIENT_CLINIC_OR_DEPARTMENT_OTHER): Payer: Medicare Other | Admitting: Oncology

## 2015-12-20 ENCOUNTER — Other Ambulatory Visit: Payer: Medicare Other

## 2015-12-20 ENCOUNTER — Encounter: Payer: Self-pay | Admitting: Oncology

## 2015-12-20 VITALS — BP 117/54 | Resp 18 | Ht 60.0 in | Wt 167.8 lb

## 2015-12-20 DIAGNOSIS — G4730 Sleep apnea, unspecified: Secondary | ICD-10-CM

## 2015-12-20 DIAGNOSIS — Z8543 Personal history of malignant neoplasm of ovary: Secondary | ICD-10-CM | POA: Diagnosis not present

## 2015-12-20 DIAGNOSIS — I10 Essential (primary) hypertension: Secondary | ICD-10-CM | POA: Diagnosis not present

## 2015-12-20 DIAGNOSIS — D3910 Neoplasm of uncertain behavior of unspecified ovary: Secondary | ICD-10-CM

## 2015-12-20 DIAGNOSIS — Z8719 Personal history of other diseases of the digestive system: Secondary | ICD-10-CM

## 2015-12-20 NOTE — Progress Notes (Signed)
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 Initial diagnosis was in 2000; she has never had systemic treatment.  Patient's husband of >60 years died in 09/09/2015 of heart failure.  Patient has multiple significant comorbid conditions, including CAD and A fib on long term anticoagulation. She was hospitalized for cardiac indications 11-9 thru 12-15-15, and has close outpatient follow up with cardiology. She has had no recent imaging abdomen or pelvis, but no discomfort, no change in bowels, no nausea or vomiting, bowels moving regularly, no change in bladder function, no LE swelling. She has had decreased appetite since husband's death and with recent hospitalization, but is trying to improve. She denies increased SOB or chest pain today. She has slight clear rhinorrhea new, no fever or lower respiratory symptoms. Remainder of 10 point Review of Systems negative.   Patient to get flu vaccine from pharmacy in near future No central line No genetics testing  Hospice assisted with husband and patient continues to work with their grief counselors " "I want to make my grief useful for myself and others"  ONCOLOGIC HISTORY The granulosa cell tumor was initially diagnosed at [facility] in 2000, with recurrences in 2004, 2007 and spring 2011. The original operative note from 2000 and that pathology did not transfer into present EMR and I do not know laterality of the original tumor. The surgery in 2004 included resection of the tumor with small bowel resection and colectomy for diverticulosis. She had laparoscopic repair of ventral hernia in 2005. In 2007 she had resection of pelvic recurrence of tumor plus open repair of ventral hernia with mesh. Most recent  intervention was laparoscopic resection of suprapubic involvement by Dr.Hoxworth in May 2011; she had a small and asymptomatic suprapubic hernia in 2011 which has been followed. All of her disease has been very indolent and has generally been detected by CT scans. Last CT AP was April 2013; [name], self-limited episode of bowel obstruction in May 2012.  Objective:  Vital signs in last 24 hours:  BP (!) 117/54 (BP Location: Left Arm, Patient Position: Sitting)   Resp 18   Ht 5' (1.524 m)   Wt 167 lb 12.8 oz (76.1 kg)   SpO2 98%   BMI 32.77 kg/m  Weight down 11 lbs from 12-2014.  Alert, oriented and appropriate. Ambulatory without assistance difficulty.  Alopecia  HEENT:PERRL, sclerae not icteric. Oral mucosa moist without lesions, posterior pharynx clear.  Neck supple. No JVD.  Lymphatics:no cervical,supraclavicular or inguinal adenopathy Resp: clear to auscultation bilaterally and normal percussion bilaterally Cardio: irregular rate and rhythm. No gallop. GI: soft, nontender, not distended, no mass or organomegaly. Normally active bowel sounds. Surgical incision not remarkable. Musculoskeletal/ Extremities: LE without pitting edema, cords, tenderness Neuro:  nonfocal  PSYCH tearful at times discussing loss of husband Skin without rash, ecchymosis, petechiae Breasts: without dominant mass, skin or nipple findings. Axillae benign. Portacath-without erythema or tenderness  Lab Results: Reviewed in EMR from 11-9 and 11-11  Studies/Results: PORTABLE CHEST 1 VIEW  12-13-15  COMPARISON:  Chest x-ray of 05/17/2015  FINDINGS: No active infiltrate or effusion is seen. The lungs are slightly hyperaerated. Mediastinal and hilar contours are unremarkable and cardiomegaly is  stable.  IMPRESSION: Stable cardiomegaly.  No active lung disease.   Mammograms 08-2014 unremarkable  Medications: I have reviewed  the patient's current medications.  DISCUSSION We have discussed very indolent nature of her disease. With no symptoms of concern, and with other significant medical issues, no indication for repeat scans now. I mentioned another patient with same diagnosis/ more advanced disease doing well on hormone blockers (tamoxifen and megace).   Patient prefers staying established with medical oncologist. She would like to meed the physician who will assume care of my patients, then likely ok with either yearly or prn follow up at this office. It seems likely that her other medical issues will remain the priority.  She will have flu vaccine elsewhere soon.    Assessment/Plan:  1.granulosa cell tumor of ovary: several local recurrences since 2004 treated surgically, very indolent and no obvious symptoms now. She has never needed systemic treatment. She would like to stay established with medical oncology, plan as above. 2.cardiac disease: CAD with stents, pacer, Afib/ tachy brady, LBBB, hx MI, on anticoagulation. Followed closely by Dr Irish Lack 3.flu vaccine upcoming 4.sleep apnea on CPAP, followed by Dr Maxwell Caul 5.HTN, elevated lipids, PVD 6.loss of husband in 08-2015. Appropriately grieving, has counselors and family very supportive.  All questions answered and patient is in agreement with plans above. Time spent 20 min including >50% counseling and coordination of care. Route Dr Delfina [place], cc Dr Excell Seltzer   Evlyn Clines, MD   12/20/2015, 6:30 PM

## 2015-12-22 ENCOUNTER — Other Ambulatory Visit: Payer: Self-pay | Admitting: Interventional Cardiology

## 2015-12-22 DIAGNOSIS — Z8719 Personal history of other diseases of the digestive system: Secondary | ICD-10-CM | POA: Insufficient documentation

## 2015-12-25 ENCOUNTER — Ambulatory Visit
Admission: RE | Admit: 2015-12-25 | Discharge: 2015-12-25 | Disposition: A | Discharge status: Home / Self Care | Payer: Medicare Other | Source: Ambulatory Visit | Attending: Internal Medicine | Admitting: Internal Medicine

## 2015-12-25 DIAGNOSIS — Z1231 Encounter for screening mammogram for malignant neoplasm of breast: Secondary | ICD-10-CM | POA: Diagnosis not present

## 2015-12-26 ENCOUNTER — Ambulatory Visit (INDEPENDENT_AMBULATORY_CARE_PROVIDER_SITE_OTHER): Payer: Medicare Other | Admitting: *Deleted

## 2015-12-26 ENCOUNTER — Encounter (HOSPITAL_COMMUNITY): Payer: Self-pay | Admitting: Nurse Practitioner

## 2015-12-26 ENCOUNTER — Ambulatory Visit (HOSPITAL_COMMUNITY)
Admission: RE | Admit: 2015-12-26 | Discharge: 2015-12-26 | Disposition: A | Discharge status: Home / Self Care | Payer: Medicare Other | Source: Ambulatory Visit | Attending: Nurse Practitioner | Admitting: Nurse Practitioner

## 2015-12-26 VITALS — BP 122/64 | HR 78 | Ht 60.0 in | Wt 167.4 lb

## 2015-12-26 DIAGNOSIS — Z5181 Encounter for therapeutic drug level monitoring: Secondary | ICD-10-CM | POA: Diagnosis not present

## 2015-12-26 DIAGNOSIS — I4892 Unspecified atrial flutter: Secondary | ICD-10-CM | POA: Insufficient documentation

## 2015-12-26 DIAGNOSIS — I4819 Other persistent atrial fibrillation: Secondary | ICD-10-CM

## 2015-12-26 DIAGNOSIS — I481 Persistent atrial fibrillation: Secondary | ICD-10-CM

## 2015-12-26 DIAGNOSIS — I4891 Unspecified atrial fibrillation: Secondary | ICD-10-CM | POA: Insufficient documentation

## 2015-12-26 DIAGNOSIS — I200 Unstable angina: Secondary | ICD-10-CM

## 2015-12-26 DIAGNOSIS — I454 Nonspecific intraventricular block: Secondary | ICD-10-CM | POA: Diagnosis not present

## 2015-12-26 LAB — POCT INR: INR: 2.1

## 2015-12-26 NOTE — Progress Notes (Signed)
Patient ID: Patty [name], female   DOB: 1930-06-26, 82 y.o.   MRN: [id number]    Primary Care Physician: Kandice Hams, MD Referring Physician: Dr. Aurea Graff [name] is a 82 y.o. female with a h/o permanent afib on coumadin, St Jude leadless PPM for junctional rhythm, CAD stents x 2 LAD & x 1 CFX 2000, rotational atherectomy to pCFX/pLAD & DES LAD 08/2012 chronic OM1 100%, DES CFX 11/2012, DES LAD for ISR 10/2013, DES oLAD [id number], diastolic CHF and HTN.   On 05/17/15 at approximately 7:30 AM, she was wakened from sleep by chest pain, her usual angina. She took nitroglycerin with improvement but had to keep taking them because the symptoms did not resolve. She was a little short of breath with this. She took her usual dose of Lasix and her clopidogrel, and got nauseated so did not take any additional medications. She did not vomit. She did not have any diaphoresis. When the third dose of nitroglycerin did not relieve her pain, she felt she needed to be seen and came to the emergency room.   In the emergency room, she was in rapid atrial flutter with a left bundle branch block. She was started on IV Cardizem. She became bradycardic with rates in 50's, so IV Cardizem was stopped, she was put on po diltiazem. Her troponin was mildly elevated at 0.26-0.40-0.37. She converted to NSR and no additional chest pain. It was felt that her elevation in troponin was in the setting of her rapid Afib.   She ambulated in hallways without angina and SOB. She had outpatient Lexiscan Myoview. low risk nuc study --she was noted to be in a flutter on nuc study.D/c home with Cardizem increased to 240 mg a day from 180 mg a day.   She was seen by Cecilie Kicks, NP 5/8, with 2 episodes of chest pain and fluttering for which she took a NTG with relief, HR more elevated over last few days, questioning aflutter or atach. 2 day Holter monitor was ordered but malfunctioned and will be pending another monitor 5/30.     She reported one day of low blood pressure and was told to stop the Cardizem. Since stopping Cardizem has had higher v rates and more angina symptoms. NTG sl may also be contributing to lower BP's. BP is office today is 130 /80 and other home BP's have been sufficient. Leadless pacer interrogated shows HR between a regular 100-140 for the last two weeks. Due to being leadless PPM, cannot determine atrial rhythm from interrogation.  Returns 6/8 and reports that she has felt great since she restarted the Cardizem. No further episodes of fast heart rate or angina. Her husband was sick and had to be hospitalized several weeks ago and she has had to be more available to care for him. This has cut into her gym time and she feels her exercise tolerance is slipping. She hopes to get some in home help for her husband so she can have more free time to get to the gym.  Seeing pt back in the afib clinic, 11-2, for elevated v rates associated with permanent afib and low BP over the last week. When I saw her in the spring she was having higher v rates which was triggering angina and that appears to be the case now. She has been taking SL NTG which will help the discomfort but drops the BP and still has the high v rates. She had an increase in her  Imdur from 120 mg to 180 mg qd a couple of months back but does not think significantly changed her symptoms. She has taken about 6 SL NTG over the last week. She lost her husband in July.  Return to afib clinic 11/22. Since I saw her last, she presented to the ER with chest pain and afib with RVR. She had a cath which did show stable CAD, no intervention was made and medical management recommended. It was felt that she needed more Cardizem on board which was increased to 300 mg a day and Imdur was reduced to allow for more BP. She then had nausea for several days and was convinced it was form Cardizem but she had taken this drug daily prior to admission. She tried spitting the  dose to 180 mg am and 120 mg pm. This has helped and taking an antinausea pill helped as well. She is now feeling better and HR is controlled, only one SL NTG since hospital and this was in the setting of her thinking about the upcoming holiday being the first Thanksgiving without her husband. Her device is interrogated today as part of research protocol with leadless pacemaker and functioning normally.  Today, she denies symptoms of   shortness of breath, orthopnea, PND, lower extremity edema, dizziness, presyncope, syncope, or neurologic sequela. Positive for intermittent palpitations and chest pain. The patient is tolerating medications without difficulties and is otherwise without complaint today.   Past Medical History:  Diagnosis Date  . Chronic anticoagulation - coumadin, CHADS2VASC=6 05/17/2015  . Combined systolic and diastolic heart failure, NYHA class 3 ([place])   . Coronary artery disease    a. s/p multiple stents; b. [facility] 12/14/13 with sig CAD with TO of OM1, 70-80% occusion OM2, 50-70% mLCx, 50-70% pLAD (recent FFR 0.83), diffusely diseased RCA, widely patent stetns in pLAD and LCx; c. [facility] 12/14/15 w/ 3v CAD, patent LAD & LCx stents, OM2 75% small & unchanged, RCA diff 30-40 w/o obs dz, med Rx  . Diverticulitis   . Granulosa cell carcinoma ([place])    abd; last episode was in 2009  . Hyperlipidemia   . Hypertension   . LBBB (left bundle branch block)       . Myocardial infarction 2002  . Obesity   . OSA on CPAP   . Pacemaker - St Jude Leadless PPM   . Peripheral vascular disease ([place])   . Permanent atrial fibrillation ([place]) 2013  . Renal artery stenosis ([place])   . Tachycardia-bradycardia syndrome ([place])    a. s/p leadless pacemaker ([name]) implanted by Dr [name]  . Varicose veins    Past Surgical History:  Procedure Laterality Date  . ABDOMINAL HYSTERECTOMY    . CARDIAC CATHETERIZATION  09/03/2007   EF 70%; Failed attempt at PCI to OM  . CARDIAC CATHETERIZATION  11/01/2003   EF  70%  . CARDIAC CATHETERIZATION N/A 12/14/2015   Procedure: Left Heart Cath and Coronary Angiography;  Surgeon: Burnell Blanks, MD;  Location: [facility] CV LAB;  Service: Cardiovascular;  Laterality: N/A;  . CARDIOVERSION  12/31/2011   Procedure: CARDIOVERSION;  Surgeon: [name], MD;  Location: [facility] ENDOSCOPY;  Service: Cardiovascular;  Laterality: N/A;  . CARDIOVERSION N/A 12/31/2011   Procedure: CARDIOVERSION;  Surgeon: [name], MD;  Location: [facility] CATH LAB;  Service: Cardiovascular;  Laterality: N/A;  . CATARACT EXTRACTION, BILATERAL  2015  . CHOLECYSTECTOMY  1980's  . COLON SURGERY  2004   colectomy for diverticulosis  . CORONARY  ANGIOPLASTY WITH STENT PLACEMENT  2000; 08/11/2012; 11/12/2012   3 + 2 LAD & CFX; 2nd CFX stent 11/12/2012  . FRACTIONAL FLOW RESERVE WIRE  10/07/2013   Procedure: FRACTIONAL FLOW RESERVE WIRE;  Surgeon: [name], MD;  Location: [facility] CATH LAB;  Service: Cardiovascular;;  . granulosa tumor excision  2000; 2003; 2004; 2007   "all in my abdomen including small intestines, outside my ?uterus/etc" (08/11/2012)  . HERNIA REPAIR  2005   "laparoscopic"  . LEFT HEART CATHETERIZATION WITH CORONARY ANGIOGRAM N/A 11/12/2012   Procedure: LEFT HEART CATHETERIZATION WITH CORONARY ANGIOGRAM;  Surgeon: [name], MD;  Location: [facility] CATH LAB;  Service: Cardiovascular;  Laterality: N/A;  . LEFT HEART CATHETERIZATION WITH CORONARY ANGIOGRAM N/A 10/07/2013   Procedure: LEFT HEART CATHETERIZATION WITH CORONARY ANGIOGRAM;  Surgeon: [name], MD;  Location: [facility] CATH LAB;  Service: Cardiovascular;  Laterality: N/A;  . LEFT HEART CATHETERIZATION WITH CORONARY ANGIOGRAM N/A 12/14/2013   Procedure: LEFT HEART CATHETERIZATION WITH CORONARY ANGIOGRAM;  Surgeon: Sinclair Grooms, MD;  Location: [facility] CATH LAB;  Service: Cardiovascular;  Laterality: N/A;  . LEFT HEART CATHETERIZATION WITH CORONARY ANGIOGRAM N/A 05/16/2014   Procedure: LEFT HEART  CATHETERIZATION WITH CORONARY ANGIOGRAM;  Surgeon: Sherren Mocha, MD;  Location: [facility] CATH LAB;  Service: Cardiovascular;  Laterality: N/A;  . PERCUTANEOUS CORONARY INTERVENTION-BALLOON ONLY  08/04/2012   Procedure: PERCUTANEOUS CORONARY INTERVENTION-BALLOON ONLY;  Surgeon: [name], MD;  Location: [facility] CATH LAB;  Service: Cardiovascular;;  . PERCUTANEOUS CORONARY ROTOBLATOR INTERVENTION (PCI-R) N/A 08/11/2012   Procedure: PERCUTANEOUS CORONARY ROTOBLATOR INTERVENTION (PCI-R);  Surgeon: [name], MD;  Location: [facility] CATH LAB;  Service: Cardiovascular;  Laterality: N/A;  . PERCUTANEOUS CORONARY STENT INTERVENTION (PCI-S)  10/07/2013   Procedure: PERCUTANEOUS CORONARY STENT INTERVENTION (PCI-S);  Surgeon: [name], MD;  Location: [facility] CATH LAB;  Service: Cardiovascular;;  . PERMANENT PACEMAKER INSERTION N/A 03/16/2012   [name] (SJM) leadless pacemaker (LEADLESS II STUDY PATEINT)  . SALIVARY GLAND SURGERY  2000's   "had a little lump removed; granulosa related; it was benign" (08/11/2012)  . TEE WITHOUT CARDIOVERSION  12/31/2011   Procedure: TRANSESOPHAGEAL ECHOCARDIOGRAM (TEE);  Surgeon: [name], MD;  Location: [facility];  Service: Cardiovascular;  Laterality: N/A;  . VARICOSE VEIN SURGERY Bilateral 1977    Current Outpatient Prescriptions  Medication Sig Dispense Refill  . atorvastatin (LIPITOR) 80 MG tablet Take 80 mg by mouth daily.    . beta carotene w/minerals (OCUVITE) tablet Take 1 tablet by mouth daily.     . Calcium Carb-Cholecalciferol (CALTRATE 600+D) 600-800 MG-UNIT TABS Take 1 tablet by mouth every morning.     . clopidogrel (PLAVIX) 75 MG tablet Take 75 mg by mouth daily.    . Coenzyme Q-10 100 MG capsule Take 200 mg by mouth at bedtime.     Marland Kitchen diltiazem (CARDIZEM CD) 300 MG 24 hr capsule Take 1 capsule (300 mg total) by mouth daily. 90 capsule 3  . diltiazem (CARDIZEM) 30 MG tablet Take 1 tablet by mouth every 4 hours AS NEEDED for heart rate  >100 as long as blood pressure >100. 45 tablet 3  . furosemide (LASIX) 40 MG tablet Take 80 mg by mouth daily.    . isosorbide mononitrate (IMDUR) 60 MG 24 hr tablet Take 1 tablet (60 mg total) by mouth daily. 30 tablet 5  . KLOR-CON M20 20 MEQ tablet TAKE 1 TABLET (20 MEQ TOTAL) BY MOUTH DAILY. 30 tablet 9  . metoprolol (LOPRESSOR) 100  MG tablet TAKE 1 TABLET (100 MG TOTAL) BY MOUTH 2 (TWO) TIMES DAILY. 60 tablet 11  . Multiple Vitamin (MULTIVITAMIN) capsule Take 1 capsule by mouth daily.    . nitroGLYCERIN (NITROSTAT) 0.4 MG SL tablet PLACE 1 TABLET (0.4 MG TOTAL) UNDER THE TONGUE EVERY 5 (FIVE) MINUTES AS NEEDED. FOR CHEST PAIN 25 tablet 3  . Omega-3 Fatty Acids (FISH OIL PO) Take 1,200 mg by mouth daily.     . ondansetron (ZOFRAN) 4 MG tablet Take 1 tablet (4 mg total) by mouth every 8 (eight) hours as needed for nausea or vomiting. 20 tablet 0  . spironolactone (ALDACTONE) 25 MG tablet Take 25 mg by mouth daily.  45 tablet 11  . warfarin (COUMADIN) 2 MG tablet Take as directed by [facility] 30 tablet 3   No current facility-administered medications for this encounter.     Allergies  Allergen Reactions  . Bee Venom Anaphylaxis  . Other Nausea And Vomiting and Other (See Comments)    Pain medications cause severe vomiting. Tolerated slow IV morphine drip    Social History   Social History  . Marital status: Married    Spouse name: N/A  . Number of children: N/A  . Years of education: N/A   Occupational History  . Retired    Social History Main Topics  . Smoking status: Former Smoker    Packs/day: 1.00    Years: 32.00    Types: Cigarettes    Quit date: 02/03/1974  . Smokeless tobacco: Never Used  . Alcohol use 3.0 oz/week    5 Glasses of wine per week     Comment: 2017 WINE WITH DINNER   . Drug use: No  . Sexual activity: No   Other Topics Concern  . Not on file   Social History Narrative   Lives with family.    Family History  Problem Relation Age of Onset    . Stroke Mother   . Heart attack Father   . Diabetes Father   . Hypertension Father   . Heart attack Brother   . Diabetes Brother   . Hypertension Brother   . Kidney failure Brother     ROS- All systems are reviewed and negative except as per the HPI above  Physical Exam: There were no vitals filed for this visit.  GEN- The patient is well appearing, alert and oriented x 3 today.   Head- normocephalic, atraumatic Eyes-  Sclera clear, conjunctiva pink Ears- hearing intact Oropharynx- clear Neck- supple, no JVP Lymph- no cervical lymphadenopathy Lungs- Clear to ausculation bilaterally, normal work of breathing Heart- irregular rate and rhythm, no murmurs, rubs or gallops, PMI not laterally displaced GI- soft, NT, ND, + BS Extremities- no clubbing, cyanosis, or edema MS- no significant deformity or atrophy Skin- no rash or lesion Psych- euthymic mood, full affect Neuro- strength and sensation are intact  EKG- Aflutter with variable v rate at  78 ms, qrs 156 ms, qtc 453 ms Epic records reviewed   Assessment and Plan: 1. Permanent afib with increased v rates triggering chest pain Continue warfarin Continue cardizem at 300 mg,  Tolerating better without nausea, divided 180 mg am and 120 mg pm, which has relieved chest pain Continue  Metoprolol Still has30 mg cardizem to use when pt is aware of increased heart rate and chest pain She is minimizing SL NTG, realizing some of her chest discomfort is part of her grieving  for her late husband Imdur  is now down  to 60 mg qd and BP's have normalized  2. CAD Continue BB, plavix, statin, imdur, ace and sl ntg as needed  F/u in Dr. Irish Lack as scheduled 12/7  Geroge Baseman. Amillya Chavira, [facility] [address] [place], [place] [id number] [phone]

## 2016-01-09 NOTE — Progress Notes (Signed)
Cardiology Office Note   Date:  01/10/2016   ID:  Patty [name], DOB [birth year], MRN [id number]  PCP:  Kandice Hams, MD    No chief complaint on file. f/u CAD   Wt Readings from Last 3 Encounters:  01/10/16 168 lb 6.4 oz (76.4 kg)  12/26/15 167 lb 6.4 oz (75.9 kg)  12/20/15 167 lb 12.8 oz (76.1 kg)       History of Present Illness: Patty [name] is a 82 y.o. female  with a history of CAD s/p multiple stents, LBBB, granulosa cell carcinoma (diagnosed 2002), chronic combined s/d CHF, PVD, HTN, HLD, tachy-brady s/p leadless PPM, permanent AFib/flutter on coumadin and OSA on CPAP who presents to clinic for post hospital follow up.   She has a significant CAD history with chronic chest pain. She has significant CAD with stenting x 2 to the LAD and x 1 to the LCx in 2000, rotational arthrectomy to proximal LCx and DES to LAD in 2014 and DES to LCx in 2014, along with DES to LAD for re-in-stent stenosis in 2015, and DES to ostial LAD in 2016.  2D echo on 05/14/2014 showed an EF of 45-50% with diffuse hypokinesis and a mildly dilated left atrium. She was seen in the office on 10/01/2015 with reported ongoing chest pain. Her husband had recently passed and she was under a lot of stress surrounding that event. She reported using SL NTG more often and being easily fatigued. She was medically managed at that time with increase in her Imdur from 120 mg to 180 mg with planned outpatient follow up. Then seen in the Afib clinic where she was noted to have elevated rates associated with her permanent Afib and a low blood pressure. She reported that the increase Imdur did not seem to have significantly helped her symptoms and that she was continuing to take SL NTG. At thatvisit she did report intermittent palpitations with associated chest pain. It was felt that her chest pain was related to her elevated V rates. Plan was made to add 30 mg Cardizem for patient to use when she is aware of her  increased heart rate and chest pain. She was instructed to take Cardizem instead of several nitroglycerin which seemed to be dropping her blood pressure. At that time her Imdur was decreased back to 120 mg daily to give more room for blood pressure. Since that visit with the Afib clinic she had been taking her prn 30 mg Cardizem at least twice daily and had reduced her SL NTG use.  She was then admitted from 11/9-11/11/17 for chest pain. She had mildly elevated troponin with flat trend (0.10-->0.10-->0.06). EKG showed 2:1 atrial flutter with RVR, 112 bpm. Coumadin was held and she was given vitamin K 2.5 mg in an effort to bring down her INR for cardiac cath on 11/10. Her lisinopril was held upon admission to allow for more BP room to rate control her Afib. Her Cardizem was increased to 240 mg daily upon admission. She underwent LHC on 11/10 that showed 3-vessel CAD with patent stents in the proximal and mid LAD with minimal stenosis. The mid and distal LAD had no obstructive disease. The LCx had a patent proximal stent with minimal restenosis. The first OM branch was occluded and filled from left to left collaterals. The second OM branch was a smaller caliber vessel with ostial 75% stenosis which was unchanged from last cardiac cath and was too small for PCI. The RCA was  large dominant vessel with diffuse 30-40% calcific stenosis but no obstructive disease. There were no focal targets for PCI although she has diffuse CAD it was not surprising that she had a mildly elevated troponin in the setting of elevated heart rate with Afib due to demand ischemia. Continued medical therapy was advised. She was continued on Plavix. ASA was discontinued given need for Coumadin. She was continued on Lipitor and Lopressor. Regarding her Afib her Cardizem was further increased to 300 mg daily. She was noted to have borderline soft blood pressure. Given that her Cardizem was increased as above for added rate control of her Afib  her Imdur was further decreased to 60 mg daily to allow for adequate blood pressure.   She called in feeling nauseated on Dilt 300mg  daily. This was changed to Cardizem 180mg  in the morning and cardizem 120mg  in the evening.   Today she presents to clinic for follow up. Her HR and BP have been well controlled over the past few weeks.  She is walking some as well, but not as much as before.  She wants to go back to cardiac rehab.  She will restart in January 2018.      Past Medical History:  Diagnosis Date  . Chronic anticoagulation - coumadin, CHADS2VASC=6 05/17/2015  . Combined systolic and diastolic heart failure, NYHA class 3 ([place])   . Coronary artery disease    a. s/p multiple stents; b. LHC 12/14/13 with sig CAD with TO of OM1, 70-80% occusion OM2, 50-70% mLCx, 50-70% pLAD (recent FFR 0.83), diffusely diseased RCA, widely patent stetns in pLAD and LCx; c. [facility] 12/14/15 w/ 3v CAD, patent LAD & LCx stents, OM2 75% small & unchanged, RCA diff 30-40 w/o obs dz, med Rx  . Diverticulitis   . Granulosa cell carcinoma ([place])    abd; last episode was in 2009  . Hyperlipidemia   . Hypertension   . LBBB (left bundle branch block)       . Myocardial infarction 2002  . Obesity   . OSA on CPAP   . Pacemaker - St Jude Leadless PPM   . Peripheral vascular disease ([place])   . Permanent atrial fibrillation ([place]) 2013  . Renal artery stenosis ([place])   . Tachycardia-bradycardia syndrome ([place])    a. s/p leadless pacemaker ([name]) implanted by Dr [name]  . Varicose veins     Past Surgical History:  Procedure Laterality Date  . ABDOMINAL HYSTERECTOMY    . CARDIAC CATHETERIZATION  09/03/2007   EF 70%; Failed attempt at PCI to OM  . CARDIAC CATHETERIZATION  11/01/2003   EF 70%  . CARDIAC CATHETERIZATION N/A 12/14/2015   Procedure: Left Heart Cath and Coronary Angiography;  Surgeon: Burnell Blanks, MD;  Location: [facility] CV LAB;  Service: Cardiovascular;  Laterality: N/A;  .  CARDIOVERSION  12/31/2011   Procedure: CARDIOVERSION;  Surgeon: [name], MD;  Location: [facility] ENDOSCOPY;  Service: Cardiovascular;  Laterality: N/A;  . CARDIOVERSION N/A 12/31/2011   Procedure: CARDIOVERSION;  Surgeon: [name], MD;  Location: [facility] CATH LAB;  Service: Cardiovascular;  Laterality: N/A;  . CATARACT EXTRACTION, BILATERAL  2015  . CHOLECYSTECTOMY  1980's  . COLON SURGERY  2004   colectomy for diverticulosis  . CORONARY ANGIOPLASTY WITH STENT PLACEMENT  2000; 08/11/2012; 11/12/2012   3 + 2 LAD & CFX; 2nd CFX stent 11/12/2012  . FRACTIONAL FLOW RESERVE WIRE  10/07/2013   Procedure: FRACTIONAL FLOW RESERVE WIRE;  Surgeon: [name], MD;  Location: [facility] CATH LAB;  Service: Cardiovascular;;  . granulosa tumor excision  2000; 2003; 2004; 2007   "all in my abdomen including small intestines, outside my ?uterus/etc" (08/11/2012)  . HERNIA REPAIR  2005   "laparoscopic"  . LEFT HEART CATHETERIZATION WITH CORONARY ANGIOGRAM N/A 11/12/2012   Procedure: LEFT HEART CATHETERIZATION WITH CORONARY ANGIOGRAM;  Surgeon: [name], MD;  Location: [facility] CATH LAB;  Service: Cardiovascular;  Laterality: N/A;  . LEFT HEART CATHETERIZATION WITH CORONARY ANGIOGRAM N/A 10/07/2013   Procedure: LEFT HEART CATHETERIZATION WITH CORONARY ANGIOGRAM;  Surgeon: [name], MD;  Location: [facility] CATH LAB;  Service: Cardiovascular;  Laterality: N/A;  . LEFT HEART CATHETERIZATION WITH CORONARY ANGIOGRAM N/A 12/14/2013   Procedure: LEFT HEART CATHETERIZATION WITH CORONARY ANGIOGRAM;  Surgeon: Sinclair Grooms, MD;  Location: [facility] CATH LAB;  Service: Cardiovascular;  Laterality: N/A;  . LEFT HEART CATHETERIZATION WITH CORONARY ANGIOGRAM N/A 05/16/2014   Procedure: LEFT HEART CATHETERIZATION WITH CORONARY ANGIOGRAM;  Surgeon: Sherren Mocha, MD;  Location: [facility] CATH LAB;  Service: Cardiovascular;  Laterality: N/A;  . PERCUTANEOUS CORONARY INTERVENTION-BALLOON ONLY  08/04/2012   Procedure:  PERCUTANEOUS CORONARY INTERVENTION-BALLOON ONLY;  Surgeon: [name], MD;  Location: [facility] CATH LAB;  Service: Cardiovascular;;  . PERCUTANEOUS CORONARY ROTOBLATOR INTERVENTION (PCI-R) N/A 08/11/2012   Procedure: PERCUTANEOUS CORONARY ROTOBLATOR INTERVENTION (PCI-R);  Surgeon: [name], MD;  Location: [facility] CATH LAB;  Service: Cardiovascular;  Laterality: N/A;  . PERCUTANEOUS CORONARY STENT INTERVENTION (PCI-S)  10/07/2013   Procedure: PERCUTANEOUS CORONARY STENT INTERVENTION (PCI-S);  Surgeon: [name], MD;  Location: [facility] CATH LAB;  Service: Cardiovascular;;  . PERMANENT PACEMAKER INSERTION N/A 03/16/2012   [name] (SJM) leadless pacemaker (LEADLESS II STUDY PATEINT)  . SALIVARY GLAND SURGERY  2000's   "had a little lump removed; granulosa related; it was benign" (08/11/2012)  . TEE WITHOUT CARDIOVERSION  12/31/2011   Procedure: TRANSESOPHAGEAL ECHOCARDIOGRAM (TEE);  Surgeon: [name], MD;  Location: [facility];  Service: Cardiovascular;  Laterality: N/A;  . VARICOSE VEIN SURGERY Bilateral 1977     Current Outpatient Prescriptions  Medication Sig Dispense Refill  . atorvastatin (LIPITOR) 80 MG tablet Take 80 mg by mouth daily.    . beta carotene w/minerals (OCUVITE) tablet Take 1 tablet by mouth daily.     . Calcium Carb-Cholecalciferol (CALTRATE 600+D) 600-800 MG-UNIT TABS Take 1 tablet by mouth every morning.     . clopidogrel (PLAVIX) 75 MG tablet Take 75 mg by mouth daily.    . Coenzyme Q-10 100 MG capsule Take 200 mg by mouth at bedtime.     Marland Kitchen diltiazem (TIAZAC) 300 MG 24 hr capsule Take 300 mg by mouth daily. Take 180 mg in am and 120 mg in pm to TOTAL 300 mg    . furosemide (LASIX) 40 MG tablet Take 80 mg by mouth daily.    . isosorbide mononitrate (IMDUR) 60 MG 24 hr tablet Take 1 tablet (60 mg total) by mouth daily. 30 tablet 5  . KLOR-CON M20 20 MEQ tablet TAKE 1 TABLET (20 MEQ TOTAL) BY MOUTH DAILY. 30 tablet 9  . metoprolol (LOPRESSOR) 100 MG  tablet TAKE 1 TABLET (100 MG TOTAL) BY MOUTH 2 (TWO) TIMES DAILY. 60 tablet 11  . Multiple Vitamin (MULTIVITAMIN) capsule Take 1 capsule by mouth daily.    . nitroGLYCERIN (NITROSTAT) 0.4 MG SL tablet PLACE 1 TABLET (0.4 MG TOTAL) UNDER THE TONGUE EVERY 5 (FIVE) MINUTES AS NEEDED. FOR CHEST PAIN 25 tablet 3  .  nitroGLYCERIN (NITROSTAT) 0.4 MG SL tablet Place 0.4 mg under the tongue every 5 (five) minutes as needed for chest pain. 3 dose max    . Omega-3 Fatty Acids (FISH OIL PO) Take 1,200 mg by mouth daily.     . ondansetron (ZOFRAN) 4 MG tablet Take 1 tablet (4 mg total) by mouth every 8 (eight) hours as needed for nausea or vomiting. 20 tablet 0  . spironolactone (ALDACTONE) 25 MG tablet Take 25 mg by mouth daily.  45 tablet 11  . warfarin (COUMADIN) 2 MG tablet Take as directed by Coumadin Clinic 30 tablet 3   No current facility-administered medications for this visit.     Allergies:   Bee venom and Other    Social History:  The patient  reports that she quit smoking about 41 years ago. Her smoking use included Cigarettes. She has a 32.00 pack-year smoking history. She has never used smokeless tobacco. She reports that she drinks about 3.0 oz of alcohol per week . She reports that she does not use drugs.   Family History:  The patient's family history includes Diabetes in her brother and father; Heart attack in her brother and father; Hypertension in her brother and father; Kidney failure in her brother; Stroke in her mother.    ROS:  Please see the history of present illness.   Otherwise, review of systems are positive for improving depression since her husband died.   All other systems are reviewed and negative.    PHYSICAL EXAM: VS:  BP (!) 120/58   Pulse (!) 58   Ht 5' (1.524 m)   Wt 168 lb 6.4 oz (76.4 kg)   BMI 32.89 kg/m  , BMI Body mass index is 32.89 kg/m. GEN: Well nourished, well developed, in no acute distress  HEENT: normal  Neck: no JVD, carotid bruits, or  masses Cardiac: irregularly irregular; no murmurs, rubs, or gallops,; tr edema  Respiratory:  clear to auscultation bilaterally, normal work of breathing GI: soft, nontender, nondistended, + BS MS: no deformity or atrophy  Skin: warm and dry, no rash Neuro:  Strength and sensation are intact Psych: euthymic mood, full affect    Recent Labs: 05/17/2015: B Natriuretic Peptide 129.9 12/19/2015: ALT 50; BUN 28; Creat 1.39; Hemoglobin 12.5; Platelets 432; Potassium 4.8; Sodium 137; TSH 3.95   Lipid Panel    Component Value Date/Time   CHOL 136 01/02/2015 0847   TRIG 67 01/02/2015 0847   HDL 62 01/02/2015 0847   CHOLHDL 2.2 01/02/2015 0847   VLDL 13 01/02/2015 0847   LDLCALC 61 01/02/2015 0847     Other studies Reviewed: Additional studies/ records that were reviewed today with results demonstrating: cath records.   ASSESSMENT AND PLAN:  1. AFib : Rate controlled. Coumadin for stroke prevention. Feels better on the current regimen of medication.  2. CAD: No angina. Continue aggressive secondary prevention.  Patent stents by most recent cath. NTG usage has decreased.  She had to use it on a plane when she felt overheated, and it resolved after 1 tab. 3. Hyperlipidemia:  Will recheck lipids in Jan. Continue lipitor. 4. Pacer: f/u with Dr. [name].     Current medicines are reviewed at length with the patient today.  The patient concerns regarding her medicines were addressed.  The following changes have been made:  No change  Labs/ tests ordered today include:  No orders of the defined types were placed in this encounter.   Recommend 150 minutes/week of aerobic exercise  Low fat, low carb, high fiber diet recommended  Disposition:   FU in 1 year   Signed, Larae Grooms, MD  01/10/2016 2:07 PM    [facility] [address], [place], [place]  [address] Phone: [phone]; Fax: [phone]

## 2016-01-10 ENCOUNTER — Ambulatory Visit (INDEPENDENT_AMBULATORY_CARE_PROVIDER_SITE_OTHER): Payer: Medicare Other | Admitting: Interventional Cardiology

## 2016-01-10 ENCOUNTER — Encounter: Payer: Self-pay | Admitting: Interventional Cardiology

## 2016-01-10 ENCOUNTER — Ambulatory Visit (INDEPENDENT_AMBULATORY_CARE_PROVIDER_SITE_OTHER): Payer: Medicare Other | Admitting: *Deleted

## 2016-01-10 VITALS — BP 120/58 | HR 58 | Ht 60.0 in | Wt 168.4 lb

## 2016-01-10 DIAGNOSIS — I209 Angina pectoris, unspecified: Secondary | ICD-10-CM | POA: Diagnosis not present

## 2016-01-10 DIAGNOSIS — E782 Mixed hyperlipidemia: Secondary | ICD-10-CM | POA: Diagnosis not present

## 2016-01-10 DIAGNOSIS — Z950 Presence of cardiac pacemaker: Secondary | ICD-10-CM | POA: Diagnosis not present

## 2016-01-10 DIAGNOSIS — I200 Unstable angina: Secondary | ICD-10-CM | POA: Diagnosis not present

## 2016-01-10 DIAGNOSIS — I10 Essential (primary) hypertension: Secondary | ICD-10-CM | POA: Diagnosis not present

## 2016-01-10 DIAGNOSIS — Z5181 Encounter for therapeutic drug level monitoring: Secondary | ICD-10-CM

## 2016-01-10 DIAGNOSIS — I4891 Unspecified atrial fibrillation: Secondary | ICD-10-CM | POA: Diagnosis not present

## 2016-01-10 DIAGNOSIS — I25118 Atherosclerotic heart disease of native coronary artery with other forms of angina pectoris: Secondary | ICD-10-CM | POA: Diagnosis not present

## 2016-01-10 LAB — POCT INR: INR: 2.6

## 2016-01-10 NOTE — Patient Instructions (Signed)
Medication Instructions:  Same-no changes  Labwork: February 11, 2016-Lipis and LFTs. Please do not eat or drink after midnight the night prior to lab draw.  Testing/Procedures: Dr Irish Lack has referred you to Cardiac rehab.  Follow-Up: Your physician wants you to follow-up in: 1 year. You will receive a reminder letter in the mail two months in advance. If you don't receive a letter, please call our office to schedule the follow-up appointment.     If you need a refill on your cardiac medications before your next appointment, please call your pharmacy.

## 2016-01-11 ENCOUNTER — Telehealth: Payer: Self-pay | Admitting: Interventional Cardiology

## 2016-01-11 NOTE — Telephone Encounter (Signed)
Message from cardiac rehab An order was put in EPIC for Caysie Kolis Bene to enroll in the Cardiac Rehab Phase 2 program. Unfortunately, she does not have a qualifying diagnosis for that program. She can participate in our non-telemetry monitored, cardiac rehab maintenance program. That order can be put in [facility] as well "AMB Referral to Cardiac Rehab Maintenance Program [id number]", or I can fax a referral for Dr. Irish Lack to sign for the cardaic rehab maintenance program. Please let me know if you have any questions, and I look forward to assisting your patient.  Thank you, [name]  This message was forwarded to Dr Hassell Done nurse

## 2016-01-18 ENCOUNTER — Other Ambulatory Visit (HOSPITAL_COMMUNITY): Payer: Self-pay | Admitting: Nurse Practitioner

## 2016-01-23 ENCOUNTER — Other Ambulatory Visit (HOSPITAL_COMMUNITY): Payer: Self-pay | Admitting: Nurse Practitioner

## 2016-02-11 ENCOUNTER — Other Ambulatory Visit: Payer: Medicare Other

## 2016-02-11 ENCOUNTER — Telehealth: Payer: Self-pay | Admitting: *Deleted

## 2016-02-11 NOTE — Telephone Encounter (Signed)
Patient called me back. I called patient to discuss the clinical study updates related to the detached docking buttons in the [place] pacemaker. There have been no serious injuries related the detached docking buttons. Patient will continue to follow-up every 6 months. I will send written letter in the mail about clinical study updates. Patient verbalized understanding.

## 2016-02-13 ENCOUNTER — Other Ambulatory Visit: Payer: Medicare Other | Admitting: *Deleted

## 2016-02-13 ENCOUNTER — Encounter (HOSPITAL_COMMUNITY)
Admission: RE | Admit: 2016-02-13 | Discharge: 2016-02-13 | Disposition: A | Discharge status: Home / Self Care | Payer: Self-pay | Source: Ambulatory Visit | Attending: Interventional Cardiology | Admitting: Interventional Cardiology

## 2016-02-13 ENCOUNTER — Ambulatory Visit (INDEPENDENT_AMBULATORY_CARE_PROVIDER_SITE_OTHER): Payer: Medicare Other | Admitting: *Deleted

## 2016-02-13 DIAGNOSIS — Z5181 Encounter for therapeutic drug level monitoring: Secondary | ICD-10-CM

## 2016-02-13 DIAGNOSIS — I4891 Unspecified atrial fibrillation: Secondary | ICD-10-CM

## 2016-02-13 DIAGNOSIS — E782 Mixed hyperlipidemia: Secondary | ICD-10-CM | POA: Diagnosis not present

## 2016-02-13 DIAGNOSIS — I208 Other forms of angina pectoris: Secondary | ICD-10-CM | POA: Insufficient documentation

## 2016-02-13 LAB — POCT INR: INR: 1.8

## 2016-02-14 LAB — LIPID PANEL
CHOLESTEROL TOTAL: 160 mg/dL (ref 100–199)
Chol/HDL Ratio: 2.4 ratio units (ref 0.0–4.4)
HDL: 68 mg/dL (ref >39)
LDL Calculated: 75 mg/dL (ref 0–99)
TRIGLYCERIDES: 84 mg/dL (ref 0–149)
VLDL Cholesterol Cal: 17 mg/dL (ref 5–40)

## 2016-02-14 LAB — HEPATIC FUNCTION PANEL
ALBUMIN: 4 g/dL (ref 3.5–4.7)
ALK PHOS: 92 IU/L (ref 39–117)
ALT: 15 IU/L (ref 0–32)
AST: 24 IU/L (ref 0–40)
BILIRUBIN TOTAL: 0.6 mg/dL (ref 0.0–1.2)
BILIRUBIN, DIRECT: 0.18 mg/dL (ref 0.00–0.40)
Total Protein: 6 g/dL (ref 6.0–8.5)

## 2016-02-15 ENCOUNTER — Encounter (HOSPITAL_COMMUNITY): Payer: Self-pay

## 2016-02-18 ENCOUNTER — Encounter (HOSPITAL_COMMUNITY)
Admission: RE | Admit: 2016-02-18 | Discharge: 2016-02-18 | Disposition: A | Discharge status: Home / Self Care | Payer: Self-pay | Source: Ambulatory Visit | Attending: Interventional Cardiology | Admitting: Interventional Cardiology

## 2016-02-18 ENCOUNTER — Telehealth: Payer: Self-pay | Admitting: *Deleted

## 2016-02-18 NOTE — Telephone Encounter (Signed)
Ptcb and has been notified of lab results by phone with verbal understanding. 

## 2016-02-20 ENCOUNTER — Encounter (HOSPITAL_COMMUNITY): Payer: Self-pay

## 2016-02-22 ENCOUNTER — Encounter (HOSPITAL_COMMUNITY)
Admission: RE | Admit: 2016-02-22 | Discharge: 2016-02-22 | Disposition: A | Discharge status: Home / Self Care | Payer: Self-pay | Source: Ambulatory Visit | Attending: Interventional Cardiology | Admitting: Interventional Cardiology

## 2016-02-25 ENCOUNTER — Encounter (HOSPITAL_COMMUNITY): Payer: Self-pay

## 2016-02-27 ENCOUNTER — Encounter (HOSPITAL_COMMUNITY)
Admission: RE | Admit: 2016-02-27 | Discharge: 2016-02-27 | Disposition: A | Discharge status: Home / Self Care | Payer: Self-pay | Source: Ambulatory Visit | Attending: Interventional Cardiology | Admitting: Interventional Cardiology

## 2016-02-29 ENCOUNTER — Encounter (HOSPITAL_COMMUNITY)
Admission: RE | Admit: 2016-02-29 | Discharge: 2016-02-29 | Disposition: A | Discharge status: Home / Self Care | Payer: Self-pay | Source: Ambulatory Visit | Attending: Interventional Cardiology | Admitting: Interventional Cardiology

## 2016-03-03 ENCOUNTER — Ambulatory Visit (INDEPENDENT_AMBULATORY_CARE_PROVIDER_SITE_OTHER): Payer: Medicare Other | Admitting: *Deleted

## 2016-03-03 ENCOUNTER — Encounter (HOSPITAL_COMMUNITY)
Admission: RE | Admit: 2016-03-03 | Discharge: 2016-03-03 | Disposition: A | Discharge status: Home / Self Care | Payer: Self-pay | Source: Ambulatory Visit | Attending: Interventional Cardiology | Admitting: Interventional Cardiology

## 2016-03-03 DIAGNOSIS — Z5181 Encounter for therapeutic drug level monitoring: Secondary | ICD-10-CM

## 2016-03-03 DIAGNOSIS — I4891 Unspecified atrial fibrillation: Secondary | ICD-10-CM | POA: Diagnosis not present

## 2016-03-03 LAB — POCT INR: INR: 1.6

## 2016-03-05 ENCOUNTER — Encounter (HOSPITAL_COMMUNITY): Payer: Self-pay

## 2016-03-06 DIAGNOSIS — F4323 Adjustment disorder with mixed anxiety and depressed mood: Secondary | ICD-10-CM | POA: Diagnosis not present

## 2016-03-07 ENCOUNTER — Encounter (HOSPITAL_COMMUNITY)
Admission: RE | Admit: 2016-03-07 | Discharge: 2016-03-07 | Disposition: A | Discharge status: Home / Self Care | Payer: Self-pay | Source: Ambulatory Visit | Attending: Interventional Cardiology | Admitting: Interventional Cardiology

## 2016-03-07 DIAGNOSIS — I208 Other forms of angina pectoris: Secondary | ICD-10-CM | POA: Insufficient documentation

## 2016-03-10 ENCOUNTER — Encounter (HOSPITAL_COMMUNITY)
Admission: RE | Admit: 2016-03-10 | Discharge: 2016-03-10 | Disposition: A | Discharge status: Home / Self Care | Payer: Self-pay | Source: Ambulatory Visit | Attending: Interventional Cardiology | Admitting: Interventional Cardiology

## 2016-03-12 ENCOUNTER — Encounter (HOSPITAL_COMMUNITY): Payer: Self-pay

## 2016-03-14 ENCOUNTER — Encounter (HOSPITAL_COMMUNITY)
Admission: RE | Admit: 2016-03-14 | Discharge: 2016-03-14 | Disposition: A | Discharge status: Home / Self Care | Payer: Self-pay | Source: Ambulatory Visit | Attending: Interventional Cardiology | Admitting: Interventional Cardiology

## 2016-03-17 ENCOUNTER — Ambulatory Visit (INDEPENDENT_AMBULATORY_CARE_PROVIDER_SITE_OTHER): Payer: Medicare Other | Admitting: *Deleted

## 2016-03-17 ENCOUNTER — Encounter (HOSPITAL_COMMUNITY)
Admission: RE | Admit: 2016-03-17 | Discharge: 2016-03-17 | Disposition: A | Discharge status: Home / Self Care | Payer: Self-pay | Source: Ambulatory Visit | Attending: Interventional Cardiology | Admitting: Interventional Cardiology

## 2016-03-17 DIAGNOSIS — Z5181 Encounter for therapeutic drug level monitoring: Secondary | ICD-10-CM

## 2016-03-17 DIAGNOSIS — I4891 Unspecified atrial fibrillation: Secondary | ICD-10-CM

## 2016-03-17 LAB — POCT INR: INR: 2.2

## 2016-03-19 ENCOUNTER — Encounter (HOSPITAL_COMMUNITY): Payer: Self-pay

## 2016-03-19 ENCOUNTER — Telehealth: Payer: Self-pay | Admitting: Hematology and Oncology

## 2016-03-19 NOTE — Telephone Encounter (Signed)
sw pt to confirm 5/10 appt at 245 pm per LOS

## 2016-03-20 DIAGNOSIS — F4323 Adjustment disorder with mixed anxiety and depressed mood: Secondary | ICD-10-CM | POA: Diagnosis not present

## 2016-03-21 ENCOUNTER — Encounter (HOSPITAL_COMMUNITY)
Admission: RE | Admit: 2016-03-21 | Discharge: 2016-03-21 | Disposition: A | Discharge status: Home / Self Care | Payer: Self-pay | Source: Ambulatory Visit | Attending: Interventional Cardiology | Admitting: Interventional Cardiology

## 2016-03-24 ENCOUNTER — Encounter (HOSPITAL_COMMUNITY)
Admission: RE | Admit: 2016-03-24 | Discharge: 2016-03-24 | Disposition: A | Discharge status: Home / Self Care | Payer: Self-pay | Source: Ambulatory Visit | Attending: Interventional Cardiology | Admitting: Interventional Cardiology

## 2016-03-26 ENCOUNTER — Encounter (HOSPITAL_COMMUNITY): Payer: Self-pay

## 2016-03-28 ENCOUNTER — Encounter (HOSPITAL_COMMUNITY): Payer: Self-pay

## 2016-03-28 DIAGNOSIS — J111 Influenza due to unidentified influenza virus with other respiratory manifestations: Secondary | ICD-10-CM | POA: Diagnosis not present

## 2016-03-28 DIAGNOSIS — R05 Cough: Secondary | ICD-10-CM | POA: Diagnosis not present

## 2016-03-31 ENCOUNTER — Encounter (HOSPITAL_COMMUNITY): Payer: Self-pay

## 2016-04-02 ENCOUNTER — Encounter (HOSPITAL_COMMUNITY): Payer: Self-pay

## 2016-04-03 DIAGNOSIS — F4323 Adjustment disorder with mixed anxiety and depressed mood: Secondary | ICD-10-CM | POA: Diagnosis not present

## 2016-04-04 ENCOUNTER — Encounter (HOSPITAL_COMMUNITY): Payer: Self-pay

## 2016-04-04 DIAGNOSIS — I208 Other forms of angina pectoris: Secondary | ICD-10-CM | POA: Insufficient documentation

## 2016-04-07 ENCOUNTER — Encounter (HOSPITAL_COMMUNITY): Payer: Self-pay

## 2016-04-08 ENCOUNTER — Ambulatory Visit (INDEPENDENT_AMBULATORY_CARE_PROVIDER_SITE_OTHER): Payer: Medicare Other | Admitting: *Deleted

## 2016-04-08 DIAGNOSIS — I4891 Unspecified atrial fibrillation: Secondary | ICD-10-CM

## 2016-04-08 DIAGNOSIS — Z5181 Encounter for therapeutic drug level monitoring: Secondary | ICD-10-CM | POA: Diagnosis not present

## 2016-04-08 LAB — POCT INR: INR: 2.3

## 2016-04-08 NOTE — Progress Notes (Addendum)
Electrophysiology Office Note Date: 04/10/2016  ID:  Patty [name], DOB [birth year], MRN [id number]  PCP: Kandice Hams, MD Primary Cardiologist: Irish Lack Electrophysiologist: Allred  CC: Pacemaker follow-up  Patty [name] is a 83 y.o. female is seen today for Dr [name].  Since last being seen in our clinic, the patient reports doing very well. She remains active exercising at the gym.  She has not had recurrent chest pain. She is recovering from the flu.  She denies palpitations, dyspnea, PND, orthopnea, nausea, vomiting, dizziness, syncope, edema.  Device History: STJ leadless PPM implanted 2014 for tachy/brady   Past Medical History:  Diagnosis Date  . Chronic anticoagulation - coumadin, CHADS2VASC=6 05/17/2015  . Combined systolic and diastolic heart failure, NYHA class 3 ([place])   . Coronary artery disease    a. s/p multiple stents; b. LHC 12/14/13 with sig CAD with TO of OM1, 70-80% occusion OM2, 50-70% mLCx, 50-70% pLAD (recent FFR 0.83), diffusely diseased RCA, widely patent stetns in pLAD and LCx; c. [facility] 12/14/15 w/ 3v CAD, patent LAD & LCx stents, OM2 75% small & unchanged, RCA diff 30-40 w/o obs dz, med Rx  . Diverticulitis   . Granulosa cell carcinoma ([place])    abd; last episode was in 2009  . Hyperlipidemia   . Hypertension   . LBBB (left bundle branch block)       . Myocardial infarction 2002  . Obesity   . OSA on CPAP   . Pacemaker - St Jude Leadless PPM   . Peripheral vascular disease ([place])   . Permanent atrial fibrillation ([place]) 2013  . Renal artery stenosis ([place])   . Tachycardia-bradycardia syndrome ([place])    a. s/p leadless pacemaker ([name]) implanted by Dr [name]  . Varicose veins    Past Surgical History:  Procedure Laterality Date  . ABDOMINAL HYSTERECTOMY    . CARDIAC CATHETERIZATION  09/03/2007   EF 70%; Failed attempt at PCI to OM  . CARDIAC CATHETERIZATION  11/01/2003   EF 70%  . CARDIAC CATHETERIZATION N/A 12/14/2015   Procedure: Left  Heart Cath and Coronary Angiography;  Surgeon: Burnell Blanks, MD;  Location: [place] [facility] LAB;  Service: Cardiovascular;  Laterality: N/A;  . CARDIOVERSION  12/31/2011   Procedure: CARDIOVERSION;  Surgeon: [name], MD;  Location: [facility] ENDOSCOPY;  Service: Cardiovascular;  Laterality: N/A;  . CARDIOVERSION N/A 12/31/2011   Procedure: CARDIOVERSION;  Surgeon: [name], MD;  Location: [facility] CATH LAB;  Service: Cardiovascular;  Laterality: N/A;  . CATARACT EXTRACTION, BILATERAL  2015  . CHOLECYSTECTOMY  1980's  . COLON SURGERY  2004   colectomy for diverticulosis  . CORONARY ANGIOPLASTY WITH STENT PLACEMENT  2000; 08/11/2012; 11/12/2012   3 + 2 LAD & CFX; 2nd CFX stent 11/12/2012  . FRACTIONAL FLOW RESERVE WIRE  10/07/2013   Procedure: FRACTIONAL FLOW RESERVE WIRE;  Surgeon: [name], MD;  Location: [facility] CATH LAB;  Service: Cardiovascular;;  . granulosa tumor excision  2000; 2003; 2004; 2007   "all in my abdomen including small intestines, outside my ?uterus/etc" (08/11/2012)  . HERNIA REPAIR  2005   "laparoscopic"  . LEFT HEART CATHETERIZATION WITH CORONARY ANGIOGRAM N/A 11/12/2012   Procedure: LEFT HEART CATHETERIZATION WITH CORONARY ANGIOGRAM;  Surgeon: [name], MD;  Location: [facility] CATH LAB;  Service: Cardiovascular;  Laterality: N/A;  . LEFT HEART CATHETERIZATION WITH CORONARY ANGIOGRAM N/A 10/07/2013   Procedure: LEFT HEART CATHETERIZATION WITH CORONARY ANGIOGRAM;  Surgeon: [name], MD;  Location:  [place] CATH LAB;  Service: Cardiovascular;  Laterality: N/A;  . LEFT HEART CATHETERIZATION WITH CORONARY ANGIOGRAM N/A 12/14/2013   Procedure: LEFT HEART CATHETERIZATION WITH CORONARY ANGIOGRAM;  Surgeon: Sinclair Grooms, MD;  Location: [facility] CATH LAB;  Service: Cardiovascular;  Laterality: N/A;  . LEFT HEART CATHETERIZATION WITH CORONARY ANGIOGRAM N/A 05/16/2014   Procedure: LEFT HEART CATHETERIZATION WITH CORONARY ANGIOGRAM;  Surgeon: Sherren Mocha,  MD;  Location: [facility] CATH LAB;  Service: Cardiovascular;  Laterality: N/A;  . PERCUTANEOUS CORONARY INTERVENTION-BALLOON ONLY  08/04/2012   Procedure: PERCUTANEOUS CORONARY INTERVENTION-BALLOON ONLY;  Surgeon: [name], MD;  Location: [facility] CATH LAB;  Service: Cardiovascular;;  . PERCUTANEOUS CORONARY ROTOBLATOR INTERVENTION (PCI-R) N/A 08/11/2012   Procedure: PERCUTANEOUS CORONARY ROTOBLATOR INTERVENTION (PCI-R);  Surgeon: [name], MD;  Location: [facility] CATH LAB;  Service: Cardiovascular;  Laterality: N/A;  . PERCUTANEOUS CORONARY STENT INTERVENTION (PCI-S)  10/07/2013   Procedure: PERCUTANEOUS CORONARY STENT INTERVENTION (PCI-S);  Surgeon: [name], MD;  Location: [facility] CATH LAB;  Service: Cardiovascular;;  . PERMANENT PACEMAKER INSERTION N/A 03/16/2012   [name] (SJM) leadless pacemaker (LEADLESS II STUDY PATEINT)  . SALIVARY GLAND SURGERY  2000's   "had a little lump removed; granulosa related; it was benign" (08/11/2012)  . TEE WITHOUT CARDIOVERSION  12/31/2011   Procedure: TRANSESOPHAGEAL ECHOCARDIOGRAM (TEE);  Surgeon: [name], MD;  Location: [place];  Service: Cardiovascular;  Laterality: N/A;  . VARICOSE VEIN SURGERY Bilateral 1977    Current Outpatient Prescriptions  Medication Sig Dispense Refill  . atorvastatin (LIPITOR) 80 MG tablet Take 80 mg by mouth daily.    . beta carotene w/minerals (OCUVITE) tablet Take 1 tablet by mouth daily.     . Calcium Carb-Cholecalciferol (CALTRATE 600+D) 600-800 MG-UNIT TABS Take 1 tablet by mouth every morning.     . clopidogrel (PLAVIX) 75 MG tablet Take 75 mg by mouth daily.    . Coenzyme Q-10 100 MG capsule Take 200 mg by mouth at bedtime.     Marland Kitchen diltiazem (CARDIZEM CD) 120 MG 24 hr capsule Take 1 capsule (120 mg total) by mouth at bedtime. 30 capsule 6  . diltiazem (TIAZAC) 180 MG 24 hr capsule Take 180 mg by mouth every morning.    . furosemide (LASIX) 40 MG tablet Take 80 mg by mouth daily.    . isosorbide  mononitrate (IMDUR) 60 MG 24 hr tablet Take 1 tablet (60 mg total) by mouth daily. 30 tablet 5  . KLOR-CON M20 20 MEQ tablet TAKE 1 TABLET (20 MEQ TOTAL) BY MOUTH DAILY. 30 tablet 9  . metoprolol (LOPRESSOR) 100 MG tablet TAKE 1 TABLET (100 MG TOTAL) BY MOUTH 2 (TWO) TIMES DAILY. 60 tablet 11  . Multiple Vitamin (MULTIVITAMIN) capsule Take 1 capsule by mouth daily.    . nitroGLYCERIN (NITROSTAT) 0.4 MG SL tablet Place 0.4 mg under the tongue every 5 (five) minutes as needed for chest pain. 3 dose max    . Omega-3 Fatty Acids (FISH OIL PO) Take 1,200 mg by mouth daily.     Marland Kitchen spironolactone (ALDACTONE) 25 MG tablet Take 25 mg by mouth daily.  45 tablet 11  . warfarin (COUMADIN) 2 MG tablet Take as directed by [facility] 30 tablet 3   No current facility-administered medications for this visit.     Allergies:   Bee venom and Other   Social History: Social History   Social History  . Marital status: Married    Spouse name: N/A  . Number of children:  N/A  . Years of education: N/A   Occupational History  . Retired    Social History Main Topics  . Smoking status: Former Smoker    Packs/day: 1.00    Years: 32.00    Types: Cigarettes    Quit date: 02/03/1974  . Smokeless tobacco: Never Used  . Alcohol use 3.0 oz/week    5 Glasses of wine per week     Comment: 2017 WINE WITH DINNER   . Drug use: No  . Sexual activity: No   Other Topics Concern  . Not on file   Social History Narrative   Lives with family.    Family History: Family History  Problem Relation Age of Onset  . Stroke Mother   . Heart attack Father   . Diabetes Father   . Hypertension Father   . Heart attack Brother   . Diabetes Brother   . Hypertension Brother   . Kidney failure Brother      Review of Systems: All other systems reviewed and are otherwise negative except as noted above.   Physical Exam: VS:  BP 124/70   Pulse 78   Ht 5' (1.524 m)   Wt 171 lb 12.8 oz (77.9 kg)   BMI 33.55 kg/m   , BMI Body mass index is 33.55 kg/m.  GEN- The patient is elderly appearing, alert and oriented x 3 today.   HEENT: normocephalic, atraumatic; sclera clear, conjunctiva pink; hearing intact; oropharynx clear; neck supple Lungs- Clear to ausculation bilaterally, normal work of breathing.  No wheezes, rales, rhonchi Heart- Regular rate and rhythm GI- soft, non-tender, non-distended, bowel sounds present Extremities- no clubbing, cyanosis, or edema; DP/PT/radial pulses 2+ bilaterally  MS- no significant deformity or atrophy Skin- warm and dry, scattered bruises Psych- euthymic mood, full affect Neuro- strength and sensation are intact  PPM Interrogation- reviewed in detail today,  See PACEART report  EKG:  EKG is not ordered today.  Recent Labs: 05/17/2015: B Natriuretic Peptide 129.9 12/19/2015: BUN 28; Creat 1.39; Hemoglobin 12.5; Platelets 432; Potassium 4.8; Sodium 137; TSH 3.95 02/13/2016: ALT 15   Wt Readings from Last 3 Encounters:  04/10/16 171 lb 12.8 oz (77.9 kg)  01/10/16 168 lb 6.4 oz (76.4 kg)  12/26/15 167 lb 6.4 oz (75.9 kg)     Other studies Reviewed: Additional studies/ records that were reviewed today include: hospital records, Dr Jackalyn Lombard office notes  Assessment and Plan:  1.  Permanent atrial fibrillation Continue Warfarin for CHADS2VASC score of at least 5 Rates are reasonable, continue current therapy Not able to communicate with pacemaker today.  Options for replacement would be Micra or transvenous pacemaker.  She would like to discuss with Dr [name] which is reasonable. Will schedule follow up for Monday.  ER precautions given.  2.  CAD No recent ischemic symptoms  Continue current therapy   3.  HTN Stable No change required today   Current medicines are reviewed at length with the patient today.   The patient does not have concerns regarding her medicines.  The following changes were made today: none  Labs/ tests ordered today include: none     Disposition:   Follow up with Dr [name] on Monday    Signed, Chanetta Marshall, NP 04/10/2016 10:01 AM  [place] [address] [place] [address] [phone] (office) [phone] (fax)

## 2016-04-09 ENCOUNTER — Other Ambulatory Visit: Payer: Self-pay | Admitting: Interventional Cardiology

## 2016-04-09 ENCOUNTER — Encounter (HOSPITAL_COMMUNITY): Payer: Self-pay

## 2016-04-10 ENCOUNTER — Ambulatory Visit (INDEPENDENT_AMBULATORY_CARE_PROVIDER_SITE_OTHER): Payer: Medicare Other | Admitting: Nurse Practitioner

## 2016-04-10 ENCOUNTER — Encounter: Payer: Self-pay | Admitting: Nurse Practitioner

## 2016-04-10 VITALS — BP 124/70 | HR 78 | Ht 60.0 in | Wt 171.8 lb

## 2016-04-10 DIAGNOSIS — I4821 Permanent atrial fibrillation: Secondary | ICD-10-CM

## 2016-04-10 DIAGNOSIS — I2510 Atherosclerotic heart disease of native coronary artery without angina pectoris: Secondary | ICD-10-CM

## 2016-04-10 DIAGNOSIS — I10 Essential (primary) hypertension: Secondary | ICD-10-CM

## 2016-04-10 DIAGNOSIS — I482 Chronic atrial fibrillation: Secondary | ICD-10-CM

## 2016-04-10 LAB — CUP PACEART INCLINIC DEVICE CHECK
Implantable Pulse Generator Implant Date: 20140211
MDC IDC SESS DTM: 20180308095554
Pulse Gen Serial Number: 1096

## 2016-04-10 NOTE — Patient Instructions (Addendum)
Medication Instructions:    Your physician recommends that you continue on your current medications as directed. Please refer to the Current Medication list given to you today.   If you need a refill on your cardiac medications before your next appointment, please call your pharmacy.  Labwork: NONE ORDERED  TODAY   Testing/Procedures: NONE ORDERED  TODAY '   Follow-Up:  See DR  [name] ON Monday AS SCHEDULED    Any Other Special Instructions Will Be Listed Below (If Applicable).

## 2016-04-11 ENCOUNTER — Encounter (HOSPITAL_COMMUNITY): Admission: RE | Admit: 2016-04-11 | Payer: Self-pay | Source: Ambulatory Visit

## 2016-04-13 DIAGNOSIS — F4323 Adjustment disorder with mixed anxiety and depressed mood: Secondary | ICD-10-CM | POA: Diagnosis not present

## 2016-04-14 ENCOUNTER — Encounter (HOSPITAL_COMMUNITY): Admission: RE | Admit: 2016-04-14 | Payer: Self-pay | Source: Ambulatory Visit

## 2016-04-14 ENCOUNTER — Ambulatory Visit (INDEPENDENT_AMBULATORY_CARE_PROVIDER_SITE_OTHER): Payer: Medicare Other | Admitting: Internal Medicine

## 2016-04-14 VITALS — BP 138/78 | HR 69 | Ht 60.0 in | Wt 173.4 lb

## 2016-04-14 DIAGNOSIS — I495 Sick sinus syndrome: Secondary | ICD-10-CM | POA: Diagnosis not present

## 2016-04-14 DIAGNOSIS — I482 Chronic atrial fibrillation, unspecified: Secondary | ICD-10-CM

## 2016-04-14 DIAGNOSIS — I2510 Atherosclerotic heart disease of native coronary artery without angina pectoris: Secondary | ICD-10-CM

## 2016-04-14 DIAGNOSIS — I10 Essential (primary) hypertension: Secondary | ICD-10-CM | POA: Diagnosis not present

## 2016-04-14 DIAGNOSIS — Z950 Presence of cardiac pacemaker: Secondary | ICD-10-CM | POA: Diagnosis not present

## 2016-04-14 NOTE — Progress Notes (Signed)
Electrophysiology Office Note   Date:  04/14/2016   ID:  Patty [name], DOB [birth year], MRN [id number]  Cardiologist:  Dr Irish Lack Primary Electrophysiologist: Thompson Grayer, MD    Chief Complaint  Patient presents with  . Atrial Fibrillation     History of Present Illness: Patty [name] is a 83 y.o. female who presents today for electrophysiology evaluation.   She presents today for device follow-up.  Last week, she was found to have premature battery failure from her leadless pacemaker.  She is actually doing quite well.  Her heart rates have been very stable 70's for quite some time.  She has not had symptomatic tachycardia or bradycardia.  Today, she denies symptoms of palpitations,  shortness of breath, orthopnea, PND, lower extremity edema, claudication, dizziness, presyncope, syncope, bleeding, or neurologic sequela. The patient is tolerating medications without difficulties and is otherwise without complaint today.    Past Medical History:  Diagnosis Date  . Chronic anticoagulation - coumadin, CHADS2VASC=6 05/17/2015  . Combined systolic and diastolic heart failure, NYHA class 3 ([place])   . Coronary artery disease    a. s/p multiple stents; b. LHC 12/14/13 with sig CAD with TO of OM1, 70-80% occusion OM2, 50-70% mLCx, 50-70% pLAD (recent FFR 0.83), diffusely diseased RCA, widely patent stetns in pLAD and LCx; c. [facility] 12/14/15 w/ 3v CAD, patent LAD & LCx stents, OM2 75% small & unchanged, RCA diff 30-40 w/o obs dz, med Rx  . Diverticulitis   . Granulosa cell carcinoma ([place])    abd; last episode was in 2009  . Hyperlipidemia   . Hypertension   . LBBB (left bundle branch block)       . Myocardial infarction 2002  . Obesity   . OSA on CPAP   . Pacemaker - St Jude Leadless PPM   . Peripheral vascular disease ([place])   . Permanent atrial fibrillation ([place]) 2013  . Renal artery stenosis ([place])   . Tachycardia-bradycardia syndrome ([place])    a. s/p leadless pacemaker ([name])  implanted by Dr [name]  . Varicose veins    Past Surgical History:  Procedure Laterality Date  . ABDOMINAL HYSTERECTOMY    . CARDIAC CATHETERIZATION  09/03/2007   EF 70%; Failed attempt at PCI to OM  . CARDIAC CATHETERIZATION  11/01/2003   EF 70%  . CARDIAC CATHETERIZATION N/A 12/14/2015   Procedure: Left Heart Cath and Coronary Angiography;  Surgeon: Burnell Blanks, MD;  Location: [facility] LAB;  Service: Cardiovascular;  Laterality: N/A;  . CARDIOVERSION  12/31/2011   Procedure: CARDIOVERSION;  Surgeon: [name], MD;  Location: [facility] ENDOSCOPY;  Service: Cardiovascular;  Laterality: N/A;  . CARDIOVERSION N/A 12/31/2011   Procedure: CARDIOVERSION;  Surgeon: [name], MD;  Location: [facility] CATH LAB;  Service: Cardiovascular;  Laterality: N/A;  . CATARACT EXTRACTION, BILATERAL  2015  . CHOLECYSTECTOMY  1980's  . COLON SURGERY  2004   colectomy for diverticulosis  . CORONARY ANGIOPLASTY WITH STENT PLACEMENT  2000; 08/11/2012; 11/12/2012   3 + 2 LAD & CFX; 2nd CFX stent 11/12/2012  . FRACTIONAL FLOW RESERVE WIRE  10/07/2013   Procedure: FRACTIONAL FLOW RESERVE WIRE;  Surgeon: [name], MD;  Location: [facility] CATH LAB;  Service: Cardiovascular;;  . granulosa tumor excision  2000; 2003; 2004; 2007   "all in my abdomen including small intestines, outside my ?uterus/etc" (08/11/2012)  . HERNIA REPAIR  2005   "laparoscopic"  . LEFT HEART CATHETERIZATION WITH CORONARY ANGIOGRAM N/A 11/12/2012  Procedure: LEFT HEART CATHETERIZATION WITH CORONARY ANGIOGRAM;  Surgeon: [name], MD;  Location: [facility] CATH LAB;  Service: Cardiovascular;  Laterality: N/A;  . LEFT HEART CATHETERIZATION WITH CORONARY ANGIOGRAM N/A 10/07/2013   Procedure: LEFT HEART CATHETERIZATION WITH CORONARY ANGIOGRAM;  Surgeon: [name], MD;  Location: [facility] CATH LAB;  Service: Cardiovascular;  Laterality: N/A;  . LEFT HEART CATHETERIZATION WITH CORONARY ANGIOGRAM N/A 12/14/2013    Procedure: LEFT HEART CATHETERIZATION WITH CORONARY ANGIOGRAM;  Surgeon: Sinclair Grooms, MD;  Location: [facility] CATH LAB;  Service: Cardiovascular;  Laterality: N/A;  . LEFT HEART CATHETERIZATION WITH CORONARY ANGIOGRAM N/A 05/16/2014   Procedure: LEFT HEART CATHETERIZATION WITH CORONARY ANGIOGRAM;  Surgeon: Sherren Mocha, MD;  Location: [facility] CATH LAB;  Service: Cardiovascular;  Laterality: N/A;  . PERCUTANEOUS CORONARY INTERVENTION-BALLOON ONLY  08/04/2012   Procedure: PERCUTANEOUS CORONARY INTERVENTION-BALLOON ONLY;  Surgeon: [name], MD;  Location: [facility] CATH LAB;  Service: Cardiovascular;;  . PERCUTANEOUS CORONARY ROTOBLATOR INTERVENTION (PCI-R) N/A 08/11/2012   Procedure: PERCUTANEOUS CORONARY ROTOBLATOR INTERVENTION (PCI-R);  Surgeon: [name], MD;  Location: [facility] CATH LAB;  Service: Cardiovascular;  Laterality: N/A;  . PERCUTANEOUS CORONARY STENT INTERVENTION (PCI-S)  10/07/2013   Procedure: PERCUTANEOUS CORONARY STENT INTERVENTION (PCI-S);  Surgeon: [name], MD;  Location: [facility] CATH LAB;  Service: Cardiovascular;;  . PERMANENT PACEMAKER INSERTION N/A 03/16/2012   [name] (SJM) leadless pacemaker (LEADLESS II STUDY PATEINT)  . SALIVARY GLAND SURGERY  2000's   "had a little lump removed; granulosa related; it was benign" (08/11/2012)  . TEE WITHOUT CARDIOVERSION  12/31/2011   Procedure: TRANSESOPHAGEAL ECHOCARDIOGRAM (TEE);  Surgeon: [name], MD;  Location: [place];  Service: Cardiovascular;  Laterality: N/A;  . VARICOSE VEIN SURGERY Bilateral 1977     Current Outpatient Prescriptions  Medication Sig Dispense Refill  . atorvastatin (LIPITOR) 80 MG tablet Take 80 mg by mouth daily.    . beta carotene w/minerals (OCUVITE) tablet Take 1 tablet by mouth daily.     . Calcium Carb-Cholecalciferol (CALTRATE 600+D) 600-800 MG-UNIT TABS Take 1 tablet by mouth every morning.     . clopidogrel (PLAVIX) 75 MG tablet Take 75 mg by mouth daily.    . Coenzyme Q-10  100 MG capsule Take 200 mg by mouth at bedtime.     Marland Kitchen diltiazem (CARDIZEM CD) 120 MG 24 hr capsule Take 1 capsule (120 mg total) by mouth at bedtime. 30 capsule 6  . diltiazem (TIAZAC) 180 MG 24 hr capsule Take 180 mg by mouth every morning.    . furosemide (LASIX) 40 MG tablet Take 80 mg by mouth daily.    . isosorbide mononitrate (IMDUR) 60 MG 24 hr tablet Take 1 tablet (60 mg total) by mouth daily. 30 tablet 5  . KLOR-CON M20 20 MEQ tablet TAKE 1 TABLET (20 MEQ TOTAL) BY MOUTH DAILY. 30 tablet 9  . metoprolol (LOPRESSOR) 100 MG tablet TAKE 1 TABLET (100 MG TOTAL) BY MOUTH 2 (TWO) TIMES DAILY. 60 tablet 11  . Multiple Vitamin (MULTIVITAMIN) capsule Take 1 capsule by mouth daily.    . nitroGLYCERIN (NITROSTAT) 0.4 MG SL tablet Place 0.4 mg under the tongue every 5 (five) minutes as needed for chest pain. 3 dose max    . Omega-3 Fatty Acids (FISH OIL PO) Take 1,200 mg by mouth daily.     Marland Kitchen spironolactone (ALDACTONE) 25 MG tablet Take 25 mg by mouth daily.  45 tablet 11  . warfarin (COUMADIN) 2 MG tablet Take as directed  by [facility] 30 tablet 3   No current facility-administered medications for this visit.     Allergies:   Bee venom and Other   Social History:  The patient  reports that she quit smoking about 42 years ago. Her smoking use included Cigarettes. She has a 32.00 pack-year smoking history. She has never used smokeless tobacco. She reports that she drinks about 3.0 oz of alcohol per week . She reports that she does not use drugs.   Family History:  The patient's family history includes Diabetes in her brother and father; Heart attack in her brother and father; Hypertension in her brother and father; Kidney failure in her brother; Stroke in her mother.   ROS:  Please see the history of present illness.   All other systems are reviewed and negative.   PHYSICAL EXAM: VS:  BP 138/78   Pulse 69   Ht 5' (1.524 m)   Wt 173 lb 6 oz (78.6 kg)   SpO2 98%   BMI 33.86 kg/m  , BMI  Body mass index is 33.86 kg/m. GEN: Well nourished, well developed, in no acute distress  HEENT: normal  Neck: no JVD, carotid bruits, or masses Cardiac: RRR  Respiratory:  clear to auscultation bilaterally, normal work of breathing GI: soft, nontender, nondistended, + BS MS: no deformity or atrophy  Skin: warm and dry  Neuro:  Strength and sensation are intact Psych: euthymic mood, full affect  EKG:  EKG is ordered today. The ekg ordered today shows atypical atrial flutter, V rates 70 bpm, IVCD  Device cannot be interrogated today  Recent Labs: 05/17/2015: B Natriuretic Peptide 129.9 12/19/2015: BUN 28; Creat 1.39; Hemoglobin 12.5; Platelets 432; Potassium 4.8; Sodium 137; TSH 3.95 02/13/2016: ALT 15    Lipid Panel     Component Value Date/Time   CHOL 160 02/13/2016 0855   TRIG 84 02/13/2016 0855   HDL 68 02/13/2016 0855   CHOLHDL 2.4 02/13/2016 0855   CHOLHDL 2.2 01/02/2015 0847   VLDL 13 01/02/2015 0847   LDLCALC 75 02/13/2016 0855    Wt Readings from Last 3 Encounters:  04/14/16 173 lb 6 oz (78.6 kg)  04/10/16 171 lb 12.8 oz (77.9 kg)  01/10/16 168 lb 6.4 oz (76.4 kg)    Other studies Reviewed: Additional studies/ records that were reviewed today include: multiple recent APP visits reviewed  ASSESSMENT AND PLAN:  1.  Persistent afib/ atypical atrial flutter Appropriately anticoagulated for chads2vasc score of at least 5 Rate controlled  2. Bradycardia Asymptomatic Her leadless pacemaker has premature battery depletion and is no longer active.  Alternatives for treatment of bradycardia including watchful waiting, new leadless pacemaker (Micra) or transvenous pacing were discussed today.  Risks, benefits, alternatives to pacemaker implantation were discussed in detail with the patient today.  She is clear at this time that she would prefer a conservative approach.  If she has additional symptomatic bradycardia then she may be more willing to consider pacing at  that time.  3. CAD  Stable No change required today  4. HTN Stable No change required today   Current medicines are reviewed at length with the patient today.   The patient does not have concerns regarding her medicines.  The following changes were made today:  none  Follow-up with Dr Irish Lack as scheduled Follow-up with me in 3 months.  She will contact my office should problems arise in the interim.  Signed, Thompson Grayer, MD  04/14/2016 1:31 PM  [place] [address] [place] [address] [phone] (office) [phone] (fax)

## 2016-04-14 NOTE — Patient Instructions (Addendum)

## 2016-04-16 ENCOUNTER — Encounter (HOSPITAL_COMMUNITY)
Admission: RE | Admit: 2016-04-16 | Discharge: 2016-04-16 | Disposition: A | Discharge status: Home / Self Care | Payer: Self-pay | Source: Ambulatory Visit | Attending: Interventional Cardiology | Admitting: Interventional Cardiology

## 2016-04-17 DIAGNOSIS — F4323 Adjustment disorder with mixed anxiety and depressed mood: Secondary | ICD-10-CM | POA: Diagnosis not present

## 2016-04-18 ENCOUNTER — Encounter (HOSPITAL_COMMUNITY)
Admission: RE | Admit: 2016-04-18 | Discharge: 2016-04-18 | Disposition: A | Discharge status: Home / Self Care | Payer: Self-pay | Source: Ambulatory Visit | Attending: Interventional Cardiology | Admitting: Interventional Cardiology

## 2016-04-21 ENCOUNTER — Encounter (HOSPITAL_COMMUNITY): Payer: Self-pay

## 2016-04-23 ENCOUNTER — Encounter (HOSPITAL_COMMUNITY)
Admission: RE | Admit: 2016-04-23 | Discharge: 2016-04-23 | Disposition: A | Discharge status: Home / Self Care | Payer: Self-pay | Source: Ambulatory Visit | Attending: Interventional Cardiology | Admitting: Interventional Cardiology

## 2016-04-25 ENCOUNTER — Encounter (HOSPITAL_COMMUNITY)
Admission: RE | Admit: 2016-04-25 | Discharge: 2016-04-25 | Disposition: A | Discharge status: Home / Self Care | Payer: Self-pay | Source: Ambulatory Visit | Attending: Interventional Cardiology | Admitting: Interventional Cardiology

## 2016-04-28 ENCOUNTER — Encounter (HOSPITAL_COMMUNITY)
Admission: RE | Admit: 2016-04-28 | Discharge: 2016-04-28 | Disposition: A | Discharge status: Home / Self Care | Payer: Self-pay | Source: Ambulatory Visit | Attending: Interventional Cardiology | Admitting: Interventional Cardiology

## 2016-04-30 ENCOUNTER — Ambulatory Visit (INDEPENDENT_AMBULATORY_CARE_PROVIDER_SITE_OTHER): Payer: Medicare Other | Admitting: *Deleted

## 2016-04-30 ENCOUNTER — Encounter (HOSPITAL_COMMUNITY)
Admission: RE | Admit: 2016-04-30 | Discharge: 2016-04-30 | Disposition: A | Discharge status: Home / Self Care | Payer: Self-pay | Source: Ambulatory Visit | Attending: Interventional Cardiology | Admitting: Interventional Cardiology

## 2016-04-30 DIAGNOSIS — I2510 Atherosclerotic heart disease of native coronary artery without angina pectoris: Secondary | ICD-10-CM

## 2016-04-30 DIAGNOSIS — I4891 Unspecified atrial fibrillation: Secondary | ICD-10-CM | POA: Diagnosis not present

## 2016-04-30 DIAGNOSIS — Z5181 Encounter for therapeutic drug level monitoring: Secondary | ICD-10-CM

## 2016-04-30 LAB — POCT INR: INR: 1.8

## 2016-05-01 DIAGNOSIS — F4323 Adjustment disorder with mixed anxiety and depressed mood: Secondary | ICD-10-CM | POA: Diagnosis not present

## 2016-05-02 ENCOUNTER — Encounter (HOSPITAL_COMMUNITY): Payer: Self-pay

## 2016-05-05 ENCOUNTER — Encounter (HOSPITAL_COMMUNITY): Payer: Self-pay

## 2016-05-05 DIAGNOSIS — I208 Other forms of angina pectoris: Secondary | ICD-10-CM | POA: Insufficient documentation

## 2016-05-06 ENCOUNTER — Other Ambulatory Visit: Payer: Self-pay | Admitting: Interventional Cardiology

## 2016-05-07 ENCOUNTER — Encounter (HOSPITAL_COMMUNITY)
Admission: RE | Admit: 2016-05-07 | Discharge: 2016-05-07 | Disposition: A | Discharge status: Home / Self Care | Payer: Self-pay | Source: Ambulatory Visit | Attending: Interventional Cardiology | Admitting: Interventional Cardiology

## 2016-05-09 ENCOUNTER — Encounter (HOSPITAL_COMMUNITY)
Admission: RE | Admit: 2016-05-09 | Discharge: 2016-05-09 | Disposition: A | Discharge status: Home / Self Care | Payer: Self-pay | Source: Ambulatory Visit | Attending: Interventional Cardiology | Admitting: Interventional Cardiology

## 2016-05-11 ENCOUNTER — Other Ambulatory Visit: Payer: Self-pay | Admitting: Interventional Cardiology

## 2016-05-12 ENCOUNTER — Encounter (HOSPITAL_COMMUNITY)
Admission: RE | Admit: 2016-05-12 | Discharge: 2016-05-12 | Disposition: A | Discharge status: Home / Self Care | Payer: Self-pay | Source: Ambulatory Visit | Attending: Interventional Cardiology | Admitting: Interventional Cardiology

## 2016-05-14 ENCOUNTER — Encounter (HOSPITAL_COMMUNITY): Payer: Self-pay

## 2016-05-15 DIAGNOSIS — L720 Epidermal cyst: Secondary | ICD-10-CM | POA: Diagnosis not present

## 2016-05-15 DIAGNOSIS — D485 Neoplasm of uncertain behavior of skin: Secondary | ICD-10-CM | POA: Diagnosis not present

## 2016-05-15 DIAGNOSIS — F4323 Adjustment disorder with mixed anxiety and depressed mood: Secondary | ICD-10-CM | POA: Diagnosis not present

## 2016-05-16 ENCOUNTER — Encounter (HOSPITAL_COMMUNITY)
Admission: RE | Admit: 2016-05-16 | Discharge: 2016-05-16 | Disposition: A | Discharge status: Home / Self Care | Payer: Self-pay | Source: Ambulatory Visit | Attending: Interventional Cardiology | Admitting: Interventional Cardiology

## 2016-05-16 ENCOUNTER — Ambulatory Visit (INDEPENDENT_AMBULATORY_CARE_PROVIDER_SITE_OTHER): Payer: Medicare Other | Admitting: *Deleted

## 2016-05-16 DIAGNOSIS — I2510 Atherosclerotic heart disease of native coronary artery without angina pectoris: Secondary | ICD-10-CM

## 2016-05-16 DIAGNOSIS — Z5181 Encounter for therapeutic drug level monitoring: Secondary | ICD-10-CM | POA: Diagnosis not present

## 2016-05-16 DIAGNOSIS — I4891 Unspecified atrial fibrillation: Secondary | ICD-10-CM

## 2016-05-16 LAB — POCT INR: INR: 2

## 2016-05-19 ENCOUNTER — Encounter (HOSPITAL_COMMUNITY)
Admission: RE | Admit: 2016-05-19 | Discharge: 2016-05-19 | Disposition: A | Discharge status: Home / Self Care | Payer: Self-pay | Source: Ambulatory Visit | Attending: Interventional Cardiology | Admitting: Interventional Cardiology

## 2016-05-21 ENCOUNTER — Encounter (HOSPITAL_COMMUNITY): Payer: Self-pay

## 2016-05-23 ENCOUNTER — Encounter (HOSPITAL_COMMUNITY): Payer: Self-pay

## 2016-05-26 ENCOUNTER — Encounter (HOSPITAL_COMMUNITY)
Admission: RE | Admit: 2016-05-26 | Discharge: 2016-05-26 | Disposition: A | Discharge status: Home / Self Care | Payer: Self-pay | Source: Ambulatory Visit | Attending: Interventional Cardiology | Admitting: Interventional Cardiology

## 2016-05-27 DIAGNOSIS — F4323 Adjustment disorder with mixed anxiety and depressed mood: Secondary | ICD-10-CM | POA: Diagnosis not present

## 2016-05-28 ENCOUNTER — Encounter (HOSPITAL_COMMUNITY): Payer: Self-pay

## 2016-05-30 ENCOUNTER — Encounter (HOSPITAL_COMMUNITY)
Admission: RE | Admit: 2016-05-30 | Discharge: 2016-05-30 | Disposition: A | Discharge status: Home / Self Care | Payer: Self-pay | Source: Ambulatory Visit | Attending: Interventional Cardiology | Admitting: Interventional Cardiology

## 2016-05-30 ENCOUNTER — Telehealth: Payer: Self-pay | Admitting: Interventional Cardiology

## 2016-05-30 NOTE — Telephone Encounter (Signed)
Patient calling, wanted to get ears pierced at [facility] and was advised to check with Dr. Irish Lack since patient on blood thinner. Patient calling to verify if she can get her ears pierced. Patient requests that if she does not answer, please leave a detailed message. Thanks.

## 2016-05-30 NOTE — Telephone Encounter (Signed)
New message      FTYI Patient want Dr Irish Lack to know that her pacemaker is not working and she has elected to let it stay in her chest and not get a "newer" model.  You do not have to call her unless you want to.

## 2016-05-30 NOTE — Telephone Encounter (Signed)
Noted  

## 2016-05-30 NOTE — Telephone Encounter (Signed)
Patty Billings, RN spoke with pt and made her aware ok to get ears pierced.  Pt verbalized understanding and was appreciative for call.

## 2016-06-02 ENCOUNTER — Encounter (HOSPITAL_COMMUNITY): Payer: Self-pay

## 2016-06-03 ENCOUNTER — Other Ambulatory Visit: Payer: Self-pay | Admitting: Physician Assistant

## 2016-06-03 ENCOUNTER — Other Ambulatory Visit: Payer: Self-pay | Admitting: Interventional Cardiology

## 2016-06-03 NOTE — Telephone Encounter (Signed)
Please review for refill. Thanks!  

## 2016-06-04 ENCOUNTER — Encounter (HOSPITAL_COMMUNITY)
Admission: RE | Admit: 2016-06-04 | Discharge: 2016-06-04 | Disposition: A | Discharge status: Home / Self Care | Payer: Self-pay | Source: Ambulatory Visit | Attending: Interventional Cardiology | Admitting: Interventional Cardiology

## 2016-06-04 DIAGNOSIS — I208 Other forms of angina pectoris: Secondary | ICD-10-CM | POA: Insufficient documentation

## 2016-06-05 DIAGNOSIS — L0100 Impetigo, unspecified: Secondary | ICD-10-CM | POA: Diagnosis not present

## 2016-06-06 ENCOUNTER — Encounter (HOSPITAL_COMMUNITY)
Admission: RE | Admit: 2016-06-06 | Discharge: 2016-06-06 | Disposition: A | Discharge status: Home / Self Care | Payer: Self-pay | Source: Ambulatory Visit | Attending: Interventional Cardiology | Admitting: Interventional Cardiology

## 2016-06-06 ENCOUNTER — Ambulatory Visit (INDEPENDENT_AMBULATORY_CARE_PROVIDER_SITE_OTHER): Payer: Medicare Other | Admitting: *Deleted

## 2016-06-06 DIAGNOSIS — Z5181 Encounter for therapeutic drug level monitoring: Secondary | ICD-10-CM | POA: Diagnosis not present

## 2016-06-06 DIAGNOSIS — I4891 Unspecified atrial fibrillation: Secondary | ICD-10-CM

## 2016-06-06 DIAGNOSIS — I2510 Atherosclerotic heart disease of native coronary artery without angina pectoris: Secondary | ICD-10-CM | POA: Diagnosis not present

## 2016-06-06 LAB — POCT INR: INR: 1.7

## 2016-06-09 ENCOUNTER — Encounter (HOSPITAL_COMMUNITY)
Admission: RE | Admit: 2016-06-09 | Discharge: 2016-06-09 | Disposition: A | Discharge status: Home / Self Care | Payer: Self-pay | Source: Ambulatory Visit | Attending: Interventional Cardiology | Admitting: Interventional Cardiology

## 2016-06-11 ENCOUNTER — Encounter (HOSPITAL_COMMUNITY): Payer: Medicare Other

## 2016-06-12 ENCOUNTER — Encounter: Payer: Self-pay | Admitting: Hematology and Oncology

## 2016-06-12 ENCOUNTER — Ambulatory Visit (HOSPITAL_BASED_OUTPATIENT_CLINIC_OR_DEPARTMENT_OTHER): Payer: Medicare Other | Admitting: Hematology and Oncology

## 2016-06-12 DIAGNOSIS — Z7901 Long term (current) use of anticoagulants: Secondary | ICD-10-CM

## 2016-06-12 DIAGNOSIS — I5040 Unspecified combined systolic (congestive) and diastolic (congestive) heart failure: Secondary | ICD-10-CM | POA: Diagnosis not present

## 2016-06-12 DIAGNOSIS — C569 Malignant neoplasm of unspecified ovary: Secondary | ICD-10-CM | POA: Diagnosis not present

## 2016-06-12 DIAGNOSIS — D3910 Neoplasm of uncertain behavior of unspecified ovary: Secondary | ICD-10-CM

## 2016-06-12 DIAGNOSIS — F4323 Adjustment disorder with mixed anxiety and depressed mood: Secondary | ICD-10-CM | POA: Diagnosis not present

## 2016-06-12 NOTE — Progress Notes (Signed)
[place] FOLLOW-UP progress notes  Patient Care Team: [name], MD as PCP - General (Internal Medicine)  CHIEF COMPLAINTS/PURPOSE OF VISIT:  Recurrent granulosa cell cancer, last surgery in 2011  HISTORY OF PRESENTING ILLNESS:  Patty [name] 83 y.o. female was transferred to my care after her prior physician has left.  I reviewed the patient's records extensive and collaborated the history with the patient. Summary of her history is as follows:  ONCOLOGIC HISTORY The granulosa cell tumor was initially diagnosed at [facility] in 2000, with recurrences in 2004, 2007 and spring 2011. The original operative note from 2000 and that pathology did not transfer into present EMR and I do not know laterality of the original tumor. The surgery in 2004 included resection of the tumor with small bowel resection and colectomy for diverticulosis. She had laparoscopic repair of ventral hernia in 2005. In 2007 she had resection of pelvic recurrence of tumor plus open repair of ventral hernia with mesh. Most recent intervention was laparoscopic resection of suprapubic involvement by Dr.Hoxworth in May 2011; she had a small and asymptomatic suprapubic hernia in 2011 which has been followed. All of her disease has been very indolent and has generally been detected by CT scans. Last CT AP was April 2013; [name], self-limited episode of bowel obstruction in May 2012.  She denies any abdominal symptoms such as pain, changes in bowel habits or bloating. No abnormal vaginal discharge. She is coping well since the death of her husband last year.  She lives with a cat at home. She denies recent exacerbation of CHF.  She continues on cardiac rehab. She is currently on medical management and takes warfarin for chronic anticoagulation therapy. The patient denies any recent signs or symptoms of bleeding such as spontaneous  epistaxis, hematuria or hematochezia.  MEDICAL HISTORY:  Past Medical History:  Diagnosis Date  . Chronic anticoagulation - coumadin, CHADS2VASC=6 05/17/2015  . Combined systolic and diastolic heart failure, NYHA class 3 ([place])   . Coronary artery disease    a. s/p multiple stents; b. [facility] 12/14/13 with sig CAD with TO of OM1, 70-80% occusion OM2, 50-70% mLCx, 50-70% pLAD (recent FFR 0.83), diffusely diseased RCA, widely patent stetns in pLAD and LCx; c. [facility] 12/14/15 w/ 3v CAD, patent LAD & LCx stents, OM2 75% small & unchanged, RCA diff 30-40 w/o obs dz, med Rx  . Diverticulitis   . Granulosa cell carcinoma ([place])    abd; last episode was in 2009  . Hyperlipidemia   . Hypertension   . LBBB (left bundle branch block)       . Myocardial infarction ([place]) 2002  . Obesity   . OSA on CPAP   . Pacemaker - St Jude Leadless PPM   . Peripheral vascular disease ([place])   . Permanent atrial fibrillation ([place]) 2013  . Renal artery stenosis ([place])   . Tachycardia-bradycardia syndrome ([place])    a. s/p leadless pacemaker ([name]) implanted by Dr [name]  . Varicose veins     SURGICAL HISTORY: Past Surgical History:  Procedure Laterality Date  . ABDOMINAL HYSTERECTOMY    . CARDIAC CATHETERIZATION  09/03/2007   EF 70%; Failed attempt at PCI to OM  . CARDIAC CATHETERIZATION  11/01/2003   EF 70%  . CARDIAC CATHETERIZATION N/A 12/14/2015   Procedure: Left Heart Cath and Coronary Angiography;  Surgeon: Burnell Blanks, MD;  Location: [place]  CV LAB;  Service: Cardiovascular;  Laterality: N/A;  . CARDIOVERSION  12/31/2011   Procedure: CARDIOVERSION;  Surgeon: [name], MD;  Location: [facility] ENDOSCOPY;  Service: Cardiovascular;  Laterality: N/A;  . CARDIOVERSION N/A 12/31/2011   Procedure: CARDIOVERSION;  Surgeon: [name], MD;  Location: [facility] CATH LAB;  Service: Cardiovascular;  Laterality: N/A;  . CATARACT EXTRACTION, BILATERAL  2015  . CHOLECYSTECTOMY  1980's  . COLON  SURGERY  2004   colectomy for diverticulosis  . CORONARY ANGIOPLASTY WITH STENT PLACEMENT  2000; 08/11/2012; 11/12/2012   3 + 2 LAD & CFX; 2nd CFX stent 11/12/2012  . FRACTIONAL FLOW RESERVE WIRE  10/07/2013   Procedure: FRACTIONAL FLOW RESERVE WIRE;  Surgeon: [name], MD;  Location: [facility] CATH LAB;  Service: Cardiovascular;;  . granulosa tumor excision  2000; 2003; 2004; 2007   "all in my abdomen including small intestines, outside my ?uterus/etc" (08/11/2012)  . HERNIA REPAIR  2005   "laparoscopic"  . LEFT HEART CATHETERIZATION WITH CORONARY ANGIOGRAM N/A 11/12/2012   Procedure: LEFT HEART CATHETERIZATION WITH CORONARY ANGIOGRAM;  Surgeon: [name], MD;  Location: [facility] CATH LAB;  Service: Cardiovascular;  Laterality: N/A;  . LEFT HEART CATHETERIZATION WITH CORONARY ANGIOGRAM N/A 10/07/2013   Procedure: LEFT HEART CATHETERIZATION WITH CORONARY ANGIOGRAM;  Surgeon: [name], MD;  Location: [facility] CATH LAB;  Service: Cardiovascular;  Laterality: N/A;  . LEFT HEART CATHETERIZATION WITH CORONARY ANGIOGRAM N/A 12/14/2013   Procedure: LEFT HEART CATHETERIZATION WITH CORONARY ANGIOGRAM;  Surgeon: Sinclair Grooms, MD;  Location: [facility] CATH LAB;  Service: Cardiovascular;  Laterality: N/A;  . LEFT HEART CATHETERIZATION WITH CORONARY ANGIOGRAM N/A 05/16/2014   Procedure: LEFT HEART CATHETERIZATION WITH CORONARY ANGIOGRAM;  Surgeon: Sherren Mocha, MD;  Location: [facility] CATH LAB;  Service: Cardiovascular;  Laterality: N/A;  . PERCUTANEOUS CORONARY INTERVENTION-BALLOON ONLY  08/04/2012   Procedure: PERCUTANEOUS CORONARY INTERVENTION-BALLOON ONLY;  Surgeon: [name], MD;  Location: [facility] CATH LAB;  Service: Cardiovascular;;  . PERCUTANEOUS CORONARY ROTOBLATOR INTERVENTION (PCI-R) N/A 08/11/2012   Procedure: PERCUTANEOUS CORONARY ROTOBLATOR INTERVENTION (PCI-R);  Surgeon: [name], MD;  Location: [facility] CATH LAB;  Service: Cardiovascular;  Laterality: N/A;  . PERCUTANEOUS CORONARY STENT  INTERVENTION (PCI-S)  10/07/2013   Procedure: PERCUTANEOUS CORONARY STENT INTERVENTION (PCI-S);  Surgeon: [name], MD;  Location: [facility] CATH LAB;  Service: Cardiovascular;;  . PERMANENT PACEMAKER INSERTION N/A 03/16/2012   [name] (SJM) leadless pacemaker (LEADLESS II STUDY PATEINT)  . SALIVARY GLAND SURGERY  2000's   "had a little lump removed; granulosa related; it was benign" (08/11/2012)  . TEE WITHOUT CARDIOVERSION  12/31/2011   Procedure: TRANSESOPHAGEAL ECHOCARDIOGRAM (TEE);  Surgeon: [name], MD;  Location: [place];  Service: Cardiovascular;  Laterality: N/A;  . VARICOSE VEIN SURGERY Bilateral 1977    SOCIAL HISTORY: Social History   Social History  . Marital status: Widowed    Spouse name: N/A  . Number of children: 4  . Years of education: N/A   Occupational History  . Retired Counsellor    Social History Main Topics  . Smoking status: Former Smoker    Packs/day: 1.00    Years: 32.00    Types: Cigarettes    Quit date: 02/03/1974  . Smokeless tobacco: Never Used  . Alcohol use 3.0 oz/week    5 Glasses of wine per week     Comment: 2017 WINE WITH DINNER   . Drug use: No  . Sexual activity: No  Other Topics Concern  . Not on file   Social History Narrative   Lives with family.    FAMILY HISTORY: Family History  Problem Relation Age of Onset  . Stroke Mother   . Heart attack Father   . Diabetes Father   . Hypertension Father   . Heart attack Brother   . Diabetes Brother   . Hypertension Brother   . Kidney failure Brother     ALLERGIES:  is allergic to bee venom and other.  MEDICATIONS:  Current Outpatient Prescriptions  Medication Sig Dispense Refill  . atorvastatin (LIPITOR) 80 MG tablet TAKE 1 TABLET BY MOUTH EVERY MORNING 30 tablet 9  . beta carotene w/minerals (OCUVITE) tablet Take 1 tablet by mouth daily.     . Calcium Carb-Cholecalciferol (CALTRATE 600+D) 600-800 MG-UNIT TABS Take 1 tablet by mouth every  morning.     . cephALEXin (KEFLEX) 500 MG capsule Take 500 mg by mouth 2 (two) times daily.  0  . clopidogrel (PLAVIX) 75 MG tablet TAKE 1 TABLET BY MOUTH EVERY DAY WITH BREAKFAST 88 tablet 0  . Coenzyme Q-10 100 MG capsule Take 200 mg by mouth at bedtime.     Marland Kitchen diltiazem (CARDIZEM CD) 120 MG 24 hr capsule Take 1 capsule (120 mg total) by mouth at bedtime. 30 capsule 6  . diltiazem (TIAZAC) 180 MG 24 hr capsule Take 180 mg by mouth every morning.    . furosemide (LASIX) 40 MG tablet Take 80 mg by mouth daily.    . isosorbide mononitrate (IMDUR) 60 MG 24 hr tablet TAKE 1 TABLET (60 MG TOTAL) BY MOUTH DAILY. 30 tablet 9  . KLOR-CON M20 20 MEQ tablet TAKE 1 TABLET (20 MEQ TOTAL) BY MOUTH DAILY. 30 tablet 9  . metoprolol (LOPRESSOR) 100 MG tablet TAKE 1 TABLET (100 MG TOTAL) BY MOUTH 2 (TWO) TIMES DAILY. 60 tablet 11  . Multiple Vitamin (MULTIVITAMIN) capsule Take 1 capsule by mouth daily.    . nitroGLYCERIN (NITROSTAT) 0.4 MG SL tablet Place 0.4 mg under the tongue every 5 (five) minutes as needed for chest pain. 3 dose max    . Omega-3 Fatty Acids (FISH OIL PO) Take 1,200 mg by mouth daily.     Marland Kitchen spironolactone (ALDACTONE) 25 MG tablet Take 25 mg by mouth daily.  45 tablet 11  . warfarin (COUMADIN) 2 MG tablet TAKE AS DIRECTED BY COUMADIN CLINIC 30 tablet 3   No current facility-administered medications for this visit.     REVIEW OF SYSTEMS:   Constitutional: Denies fevers, chills or abnormal night sweats Eyes: Denies blurriness of vision, double vision or watery eyes Ears, nose, mouth, throat, and face: Denies mucositis or sore throat Respiratory: Denies cough, dyspnea or wheezes Cardiovascular: Denies palpitation, chest discomfort  Gastrointestinal:  Denies nausea, heartburn or change in bowel habits Skin: Denies abnormal skin rashes Lymphatics: Denies new lymphadenopathy or easy bruising Neurological:Denies numbness, tingling or new weaknesses Behavioral/Psych: Mood is stable, no new  changes  All other systems were reviewed with the patient and are negative.  PHYSICAL EXAMINATION: ECOG PERFORMANCE STATUS: 1 - Symptomatic but completely ambulatory  Vitals:   06/12/16 1502  BP: 127/67  Pulse: 81  Resp: 18  Temp: 98.4 F (36.9 C)   Filed Weights   06/12/16 1502  Weight: 175 lb 3.2 oz (79.5 kg)    GENERAL:alert, no distress and comfortable.  She is mildly obese SKIN: Noted extensive skin bruises EYES: normal, conjunctiva are pink and non-injected, sclera clear OROPHARYNX:no exudate,  normal lips, buccal mucosa, and tongue  NECK: supple, thyroid normal size, non-tender, without nodularity LYMPH:  no palpable lymphadenopathy in the cervical, axillary or inguinal LUNGS: clear to auscultation and percussion with normal breathing effort HEART: regular rate & rhythm and no murmurs with moderate bilateral lower extremity edema ABDOMEN:abdomen soft, non-tender and normal bowel sounds.   Well-healed surgical scar Musculoskeletal:no cyanosis of digits and no clubbing. PSYCH: alert & oriented x 3 with fluent speech NEURO: no focal motor/sensory deficits  LABORATORY DATA:  I have reviewed the data as listed Lab Results  Component Value Date   WBC 8.6 12/19/2015   HGB 12.5 12/19/2015   HCT 36.9 12/19/2015   MCV 92.7 12/19/2015   PLT 432 (H) 12/19/2015    Recent Labs  12/13/15 0815 12/14/15 0751 12/15/15 0316 12/19/15 1236 02/13/16 0855  NA 134* 136 136 137  --   K 4.4 4.3 4.2 4.8  --   CL 101 102 104 99  --   CO2 24 25 24 30   --   GLUCOSE 116* 111* 121* 106*  --   BUN 37* 30* 25* 28*  --   CREATININE 1.53* 1.32* 1.32* 1.39*  --   CALCIUM 8.6* 8.6* 8.4* 9.3  --   GFRNONAA 30* 36* 36*  --   --   GFRAA 35* 42* 42*  --   --   PROT 6.5  --   --  6.7 6.0  ALBUMIN 3.0*  --   --  3.5* 4.0  AST 32  --   --  51* 24  ALT 27  --   --  50* 15  ALKPHOS 71  --   --  83 92  BILITOT 0.9  --   --  0.7 0.6  BILIDIR  --   --   --  0.2 0.18  IBILI  --   --   --  0.5   --     ASSESSMENT & PLAN:  Granulosa cell tumor of ovary ([place]) This is a very rare disease. She would need long-term follow-up. We discussed follow-up with medical oncology versus GYN oncology. Since she has never needed systemic treatment, I recommend GYN oncology follow-up in 6 months.  She agreed.  Combined systolic and diastolic heart failure, NYHA class 3 ([name]) The patient have ischemic cardiomyopathy with congestive heart failure She did not have any recent exacerbation of CHF She will continue medication as directed by her cardiologist  Chronic anticoagulation - coumadin, CHADS2VASC=6 The patient denies any recent signs or symptoms of bleeding such as spontaneous epistaxis, hematuria or hematochezia. She will continue warfarin therapy as directed.   No orders of the defined types were placed in this encounter.   All questions were answered. The patient knows to call the clinic with any problems, questions or concerns. I spent 25 minutes counseling the patient face to face. The total time spent in the appointment was 40 minutes and more than 50% was on counseling.     Heath Lark, MD 06/12/2016 3:38 PM

## 2016-06-12 NOTE — Assessment & Plan Note (Signed)
The patient have ischemic cardiomyopathy with congestive heart failure She did not have any recent exacerbation of CHF She will continue medication as directed by her cardiologist

## 2016-06-12 NOTE — Assessment & Plan Note (Addendum)
This is a very rare disease. She would need long-term follow-up. We discussed follow-up with medical oncology versus GYN oncology. Since she has never needed systemic treatment, I recommend GYN oncology follow-up in 6 months.  She agreed.

## 2016-06-12 NOTE — Assessment & Plan Note (Signed)
The patient denies any recent signs or symptoms of bleeding such as spontaneous epistaxis, hematuria or hematochezia. She will continue warfarin therapy as directed.

## 2016-06-13 ENCOUNTER — Encounter (HOSPITAL_COMMUNITY)
Admission: RE | Admit: 2016-06-13 | Discharge: 2016-06-13 | Disposition: A | Discharge status: Home / Self Care | Payer: Self-pay | Source: Ambulatory Visit | Attending: Interventional Cardiology | Admitting: Interventional Cardiology

## 2016-06-16 ENCOUNTER — Encounter (HOSPITAL_COMMUNITY)
Admission: RE | Admit: 2016-06-16 | Discharge: 2016-06-16 | Disposition: A | Discharge status: Home / Self Care | Payer: Self-pay | Source: Ambulatory Visit | Attending: Interventional Cardiology | Admitting: Interventional Cardiology

## 2016-06-17 ENCOUNTER — Telehealth: Payer: Self-pay | Admitting: *Deleted

## 2016-06-17 NOTE — Telephone Encounter (Signed)
Per email from Dr. Alvy Bimler I have set up new patient appt for the patient in 6 months.I have called and left the patient a message and mailed a calendar.

## 2016-06-18 ENCOUNTER — Encounter (HOSPITAL_COMMUNITY): Payer: Self-pay

## 2016-06-20 ENCOUNTER — Encounter (HOSPITAL_COMMUNITY)
Admission: RE | Admit: 2016-06-20 | Discharge: 2016-06-20 | Disposition: A | Discharge status: Home / Self Care | Payer: Self-pay | Source: Ambulatory Visit | Attending: Interventional Cardiology | Admitting: Interventional Cardiology

## 2016-06-20 ENCOUNTER — Ambulatory Visit (INDEPENDENT_AMBULATORY_CARE_PROVIDER_SITE_OTHER): Payer: Medicare Other | Admitting: *Deleted

## 2016-06-20 DIAGNOSIS — I2510 Atherosclerotic heart disease of native coronary artery without angina pectoris: Secondary | ICD-10-CM

## 2016-06-20 DIAGNOSIS — Z5181 Encounter for therapeutic drug level monitoring: Secondary | ICD-10-CM

## 2016-06-20 DIAGNOSIS — I4891 Unspecified atrial fibrillation: Secondary | ICD-10-CM | POA: Diagnosis not present

## 2016-06-20 LAB — POCT INR: INR: 2.9

## 2016-06-23 ENCOUNTER — Encounter (HOSPITAL_COMMUNITY)
Admission: RE | Admit: 2016-06-23 | Discharge: 2016-06-23 | Disposition: A | Discharge status: Home / Self Care | Payer: Self-pay | Source: Ambulatory Visit | Attending: Interventional Cardiology | Admitting: Interventional Cardiology

## 2016-06-24 DIAGNOSIS — G4733 Obstructive sleep apnea (adult) (pediatric): Secondary | ICD-10-CM | POA: Diagnosis not present

## 2016-06-24 DIAGNOSIS — F4323 Adjustment disorder with mixed anxiety and depressed mood: Secondary | ICD-10-CM | POA: Diagnosis not present

## 2016-06-25 ENCOUNTER — Encounter (HOSPITAL_COMMUNITY): Payer: Self-pay

## 2016-06-26 DIAGNOSIS — C44329 Squamous cell carcinoma of skin of other parts of face: Secondary | ICD-10-CM | POA: Diagnosis not present

## 2016-06-26 DIAGNOSIS — S01531A Puncture wound without foreign body of lip, initial encounter: Secondary | ICD-10-CM | POA: Diagnosis not present

## 2016-06-26 DIAGNOSIS — I10 Essential (primary) hypertension: Secondary | ICD-10-CM | POA: Diagnosis not present

## 2016-06-26 DIAGNOSIS — C4402 Squamous cell carcinoma of skin of lip: Secondary | ICD-10-CM | POA: Diagnosis not present

## 2016-06-27 ENCOUNTER — Encounter (HOSPITAL_COMMUNITY): Payer: Self-pay

## 2016-06-27 ENCOUNTER — Telehealth: Payer: Self-pay | Admitting: Pharmacist

## 2016-06-27 ENCOUNTER — Ambulatory Visit (INDEPENDENT_AMBULATORY_CARE_PROVIDER_SITE_OTHER): Payer: Medicare Other | Admitting: Pharmacist

## 2016-06-27 ENCOUNTER — Telehealth: Payer: Self-pay | Admitting: Interventional Cardiology

## 2016-06-27 DIAGNOSIS — Z5181 Encounter for therapeutic drug level monitoring: Secondary | ICD-10-CM | POA: Diagnosis not present

## 2016-06-27 DIAGNOSIS — I2510 Atherosclerotic heart disease of native coronary artery without angina pectoris: Secondary | ICD-10-CM

## 2016-06-27 DIAGNOSIS — I4891 Unspecified atrial fibrillation: Secondary | ICD-10-CM

## 2016-06-27 LAB — POCT INR: INR: 2.7

## 2016-06-27 NOTE — Telephone Encounter (Signed)
Pt called clinic stating she had a biopsy done on her lip yesterday. She does not think her dermatologist office knew she was taking Coumadin because they did not advise her to hold any of her doses prior. She ended up bleeding profusely after and went to the ED after 1.5 hours of non stop bleeding. Her lip needed to be cauterized and she was advised to hold her Coumadin last night. Pt will come in this afternoon for an INR check to make sure that she is not supratherapeutic which may be worsening her bleeding.

## 2016-06-27 NOTE — Telephone Encounter (Signed)
Did not need this encounter °

## 2016-06-28 DIAGNOSIS — I10 Essential (primary) hypertension: Secondary | ICD-10-CM | POA: Diagnosis not present

## 2016-06-28 DIAGNOSIS — S01531D Puncture wound without foreign body of lip, subsequent encounter: Secondary | ICD-10-CM | POA: Diagnosis not present

## 2016-07-02 ENCOUNTER — Encounter: Payer: Self-pay | Admitting: Internal Medicine

## 2016-07-02 ENCOUNTER — Encounter (HOSPITAL_COMMUNITY): Payer: Self-pay

## 2016-07-02 ENCOUNTER — Telehealth: Payer: Self-pay | Admitting: Internal Medicine

## 2016-07-02 NOTE — Telephone Encounter (Signed)
Returned call to patient. She is calling because the past several days she has felt "below par".  No energy, she is sleeping okay, does not have much appetite.  Last Thurs she was seeing Dr Allyson Sabal for a place on her lip.  The doctor's PA, Drinda Butts, wanted to do a biopsy.  The biopsy was done that day in the office.  The site bled for over an hour in the office and then she went home.  Patient informed them that she was on blood thinners and this was the reason it was difficult to stop.  She left the office and had blood all over her shirt by the time she returned home(25 min).  She had a towel in the car that was also covered in blood.  She got home at 5:30pm.  At 6:00pm she decided to go to an urgent care as she was still bleeding.  She was there for over 2 hours and they finally got the bleeding to stop.  She was worn out the next day. Then traveled over the weekend and sounds like she has just been pushing herself too much.  Since then she has felt tired.  She is going to rest over the weekend and keep her follow up appointments next week.  She just needed reassurance that she is okay.  She does not sound like she is any distress.

## 2016-07-02 NOTE — Telephone Encounter (Signed)
Patient calling, states that she was instructed to call back if anything changed in her condition. Please contact patient on mobile number, thanks.

## 2016-07-04 ENCOUNTER — Encounter (HOSPITAL_COMMUNITY): Payer: Self-pay

## 2016-07-04 ENCOUNTER — Telehealth (HOSPITAL_COMMUNITY): Payer: Self-pay

## 2016-07-04 ENCOUNTER — Telehealth: Payer: Self-pay | Admitting: Internal Medicine

## 2016-07-04 NOTE — Telephone Encounter (Signed)
New Message  Pt voiced she feels some better, still not 100%. Will try to get through the weekend and check with [place] on Monday. If feels worse she'll call MD on duty.

## 2016-07-04 NOTE — Telephone Encounter (Signed)
Spoke with the patient and she will call me back on Mon

## 2016-07-07 ENCOUNTER — Other Ambulatory Visit (HOSPITAL_COMMUNITY): Payer: Self-pay | Admitting: Nurse Practitioner

## 2016-07-07 ENCOUNTER — Encounter (HOSPITAL_COMMUNITY)
Admission: RE | Admit: 2016-07-07 | Discharge: 2016-07-07 | Disposition: A | Discharge status: Home / Self Care | Payer: Self-pay | Source: Ambulatory Visit | Attending: Interventional Cardiology | Admitting: Interventional Cardiology

## 2016-07-07 DIAGNOSIS — I208 Other forms of angina pectoris: Secondary | ICD-10-CM | POA: Insufficient documentation

## 2016-07-07 NOTE — Telephone Encounter (Signed)
Follow Up Call  Patty [name] is calling to let you know that she is doing much better will see you on Friday . Thanks

## 2016-07-08 DIAGNOSIS — E7800 Pure hypercholesterolemia, unspecified: Secondary | ICD-10-CM | POA: Diagnosis not present

## 2016-07-08 DIAGNOSIS — Z1389 Encounter for screening for other disorder: Secondary | ICD-10-CM | POA: Diagnosis not present

## 2016-07-08 DIAGNOSIS — R7301 Impaired fasting glucose: Secondary | ICD-10-CM | POA: Diagnosis not present

## 2016-07-08 DIAGNOSIS — Z7901 Long term (current) use of anticoagulants: Secondary | ICD-10-CM | POA: Diagnosis not present

## 2016-07-08 DIAGNOSIS — I481 Persistent atrial fibrillation: Secondary | ICD-10-CM | POA: Diagnosis not present

## 2016-07-08 DIAGNOSIS — Z0000 Encounter for general adult medical examination without abnormal findings: Secondary | ICD-10-CM | POA: Diagnosis not present

## 2016-07-08 DIAGNOSIS — I10 Essential (primary) hypertension: Secondary | ICD-10-CM | POA: Diagnosis not present

## 2016-07-08 DIAGNOSIS — N183 Chronic kidney disease, stage 3 (moderate): Secondary | ICD-10-CM | POA: Diagnosis not present

## 2016-07-08 DIAGNOSIS — R5383 Other fatigue: Secondary | ICD-10-CM | POA: Diagnosis not present

## 2016-07-10 ENCOUNTER — Inpatient Hospital Stay (HOSPITAL_COMMUNITY)
Admission: EM | Admit: 2016-07-10 | Discharge: 2016-07-16 | DRG: 247 | Disposition: A | Discharge status: Home / Self Care | Payer: Medicare Other | Attending: Internal Medicine | Admitting: Internal Medicine

## 2016-07-10 ENCOUNTER — Encounter (HOSPITAL_COMMUNITY): Payer: Self-pay

## 2016-07-10 ENCOUNTER — Emergency Department (HOSPITAL_COMMUNITY): Payer: Medicare Other

## 2016-07-10 DIAGNOSIS — E876 Hypokalemia: Secondary | ICD-10-CM | POA: Diagnosis present

## 2016-07-10 DIAGNOSIS — Z66 Do not resuscitate: Secondary | ICD-10-CM | POA: Diagnosis present

## 2016-07-10 DIAGNOSIS — E785 Hyperlipidemia, unspecified: Secondary | ICD-10-CM | POA: Diagnosis present

## 2016-07-10 DIAGNOSIS — Z7901 Long term (current) use of anticoagulants: Secondary | ICD-10-CM

## 2016-07-10 DIAGNOSIS — R0609 Other forms of dyspnea: Secondary | ICD-10-CM

## 2016-07-10 DIAGNOSIS — I481 Persistent atrial fibrillation: Secondary | ICD-10-CM | POA: Diagnosis not present

## 2016-07-10 DIAGNOSIS — R079 Chest pain, unspecified: Secondary | ICD-10-CM | POA: Diagnosis present

## 2016-07-10 DIAGNOSIS — I739 Peripheral vascular disease, unspecified: Secondary | ICD-10-CM | POA: Diagnosis present

## 2016-07-10 DIAGNOSIS — I5042 Chronic combined systolic (congestive) and diastolic (congestive) heart failure: Secondary | ICD-10-CM | POA: Diagnosis not present

## 2016-07-10 DIAGNOSIS — N390 Urinary tract infection, site not specified: Secondary | ICD-10-CM | POA: Diagnosis not present

## 2016-07-10 DIAGNOSIS — R0602 Shortness of breath: Secondary | ICD-10-CM | POA: Diagnosis not present

## 2016-07-10 DIAGNOSIS — Z90710 Acquired absence of both cervix and uterus: Secondary | ICD-10-CM

## 2016-07-10 DIAGNOSIS — Z841 Family history of disorders of kidney and ureter: Secondary | ICD-10-CM

## 2016-07-10 DIAGNOSIS — G4733 Obstructive sleep apnea (adult) (pediatric): Secondary | ICD-10-CM

## 2016-07-10 DIAGNOSIS — I5040 Unspecified combined systolic (congestive) and diastolic (congestive) heart failure: Secondary | ICD-10-CM | POA: Diagnosis present

## 2016-07-10 DIAGNOSIS — I4892 Unspecified atrial flutter: Secondary | ICD-10-CM | POA: Diagnosis not present

## 2016-07-10 DIAGNOSIS — I200 Unstable angina: Secondary | ICD-10-CM | POA: Diagnosis not present

## 2016-07-10 DIAGNOSIS — R0789 Other chest pain: Secondary | ICD-10-CM

## 2016-07-10 DIAGNOSIS — I482 Chronic atrial fibrillation: Secondary | ICD-10-CM | POA: Diagnosis present

## 2016-07-10 DIAGNOSIS — I10 Essential (primary) hypertension: Secondary | ICD-10-CM | POA: Diagnosis present

## 2016-07-10 DIAGNOSIS — I701 Atherosclerosis of renal artery: Secondary | ICD-10-CM | POA: Diagnosis present

## 2016-07-10 DIAGNOSIS — Z823 Family history of stroke: Secondary | ICD-10-CM

## 2016-07-10 DIAGNOSIS — Z885 Allergy status to narcotic agent status: Secondary | ICD-10-CM

## 2016-07-10 DIAGNOSIS — I2510 Atherosclerotic heart disease of native coronary artery without angina pectoris: Secondary | ICD-10-CM | POA: Diagnosis present

## 2016-07-10 DIAGNOSIS — I4819 Other persistent atrial fibrillation: Secondary | ICD-10-CM

## 2016-07-10 DIAGNOSIS — Z9989 Dependence on other enabling machines and devices: Secondary | ICD-10-CM

## 2016-07-10 DIAGNOSIS — Z955 Presence of coronary angioplasty implant and graft: Secondary | ICD-10-CM

## 2016-07-10 DIAGNOSIS — Z91030 Bee allergy status: Secondary | ICD-10-CM

## 2016-07-10 DIAGNOSIS — C569 Malignant neoplasm of unspecified ovary: Secondary | ICD-10-CM | POA: Diagnosis not present

## 2016-07-10 DIAGNOSIS — I110 Hypertensive heart disease with heart failure: Secondary | ICD-10-CM | POA: Diagnosis present

## 2016-07-10 DIAGNOSIS — I082 Rheumatic disorders of both aortic and tricuspid valves: Secondary | ICD-10-CM | POA: Diagnosis present

## 2016-07-10 DIAGNOSIS — I480 Paroxysmal atrial fibrillation: Secondary | ICD-10-CM | POA: Diagnosis present

## 2016-07-10 DIAGNOSIS — Z833 Family history of diabetes mellitus: Secondary | ICD-10-CM

## 2016-07-10 DIAGNOSIS — I214 Non-ST elevation (NSTEMI) myocardial infarction: Principal | ICD-10-CM | POA: Diagnosis present

## 2016-07-10 DIAGNOSIS — Z87891 Personal history of nicotine dependence: Secondary | ICD-10-CM

## 2016-07-10 DIAGNOSIS — C4402 Squamous cell carcinoma of skin of lip: Secondary | ICD-10-CM | POA: Diagnosis present

## 2016-07-10 DIAGNOSIS — I252 Old myocardial infarction: Secondary | ICD-10-CM

## 2016-07-10 DIAGNOSIS — Z8249 Family history of ischemic heart disease and other diseases of the circulatory system: Secondary | ICD-10-CM

## 2016-07-10 DIAGNOSIS — Z950 Presence of cardiac pacemaker: Secondary | ICD-10-CM | POA: Diagnosis present

## 2016-07-10 DIAGNOSIS — Z9049 Acquired absence of other specified parts of digestive tract: Secondary | ICD-10-CM

## 2016-07-10 DIAGNOSIS — Z7902 Long term (current) use of antithrombotics/antiplatelets: Secondary | ICD-10-CM

## 2016-07-10 DIAGNOSIS — I447 Left bundle-branch block, unspecified: Secondary | ICD-10-CM | POA: Diagnosis present

## 2016-07-10 DIAGNOSIS — I25110 Atherosclerotic heart disease of native coronary artery with unstable angina pectoris: Secondary | ICD-10-CM | POA: Diagnosis present

## 2016-07-10 DIAGNOSIS — D649 Anemia, unspecified: Secondary | ICD-10-CM | POA: Diagnosis present

## 2016-07-10 DIAGNOSIS — F4323 Adjustment disorder with mixed anxiety and depressed mood: Secondary | ICD-10-CM | POA: Diagnosis not present

## 2016-07-10 DIAGNOSIS — R0600 Dyspnea, unspecified: Secondary | ICD-10-CM

## 2016-07-10 LAB — CBC
HCT: 32.9 % — ABNORMAL LOW (ref 36.0–46.0)
Hemoglobin: 10.5 g/dL — ABNORMAL LOW (ref 12.0–15.0)
MCH: 30.9 pg (ref 26.0–34.0)
MCHC: 31.9 g/dL (ref 30.0–36.0)
MCV: 96.8 fL (ref 78.0–100.0)
PLATELETS: 367 10*3/uL (ref 150–400)
RBC: 3.4 MIL/uL — AB (ref 3.87–5.11)
RDW: 14.9 % (ref 11.5–15.5)
WBC: 12.2 10*3/uL — ABNORMAL HIGH (ref 4.0–10.5)

## 2016-07-10 MED ORDER — DILTIAZEM HCL ER COATED BEADS 120 MG PO CP24
120.0000 mg | ORAL_CAPSULE | Freq: Once | ORAL | Status: AC
  Administered 2016-07-11: 120 mg via ORAL
  Filled 2016-07-10 (×2): qty 1

## 2016-07-10 NOTE — ED Provider Notes (Signed)
By signing my name below, I, Margit Banda, attest that this documentation has been prepared under the direction and in the presence of Ward, Delice Bison, DO. Electronically Signed: Margit Banda, ED Scribe. 07/10/16. 11:35 PM.  TIME SEEN: 11:19 PM  CHIEF COMPLAINT: Chest Pain  HPI: Patty [name] is a 83 y.o. female BIB [facility] with a PMHx of CAD s/p 6 stents, tachybradycardia syndrome status post lead-less pacemaker, atrial fibrillation on Coumadin, and sleep apnea, who presents to the Emergency Department complaining of gradually worsening tightness in her chest that started after dinner tonight. Describes the pain as feeling like a "brick". Pain radiates to right upper arm. Associated sx include SOB, mild diaphoresis and fatigue. Pt took two nitro with mild to no relief. She has had a pacemaker for over 48 months and on 04/10/16 she found out the battery had died. The plan was to wait and see what happened and for the last three months she has been okay. However, about a week ago she notes similar sx and reports taking two nitroglycerin which alleviated her sx. At baseline she is normally an active person. She lives alone.  Last cardiac catheterization was in 12/2015. Pt denies nausea, vomiting, fever, cough, and dizziness.  States symptoms worse with exertion and states that over the past 2 weeks she has had progressively worsening shortness of breath and tonight had difficulty even standing for the chest x-ray. States this is not normal for her. She has gone for the past 4-5 months without any chest pain and it has now had 2 episodes in the past week and this episode tonight did not seem to improve with nitroglycerin like it normally does. She is currently chest pain-free.  Cardiologist is Dr. Irish Lack PCP is Dr. Delfina Redwood  Cath November 2017:   Prox RCA lesion, 30 %stenosed.  Mid RCA-1 lesion, 40 %stenosed.  Mid RCA-2 lesion, 30 %stenosed.  Dist RCA lesion, 100 %stenosed.  Ost RPDA to RPDA  lesion, 50 %stenosed.  Ost 2nd Mrg to 2nd Mrg lesion, 75 %stenosed.  Ost Cx to Prox Cx lesion, 10 %stenosed.  Ost 1st Mrg to 1st Mrg lesion, 100 %stenosed.  Mid Cx lesion, 65 %stenosed.  Ost LAD to Mid LAD lesion, 20 %stenosed.   1. Triple vessel CAD 2. Patent stents in the proximal and mid LAD with minimal restenosis. The mid and distal LAD has no obstructive disease.  3. The Circumflex has a patent proximal stent with minimal restenosis. The first OM branch is occluded and fills from left to left collaterals. The second OM branch is a small caliber vessel with ostial 75% stenosis, unchanged from last cath and too small for PCI.  4. The RCA is a large dominant vessel with diffuse 30-40% calcific stenosis but no obstructive disease.   ROS: See HPI Constitutional: no fever  Eyes: no drainage  ENT: no runny nose   Cardiovascular: + chest pain  Resp: + SOB  GI: no vomiting GU: no dysuria Integumentary: no rash  Allergy: no hives  Musculoskeletal: no leg swelling  Neurological: no slurred speech ROS otherwise negative  PAST MEDICAL HISTORY/PAST SURGICAL HISTORY:  Past Medical History:  Diagnosis Date  . Chronic anticoagulation - coumadin, CHADS2VASC=6 05/17/2015  . Combined systolic and diastolic heart failure, NYHA class 3 ([place])   . Coronary artery disease    a. s/p multiple stents; b. [facility] 12/14/13 with sig CAD with TO of OM1, 70-80% occusion OM2, 50-70% mLCx, 50-70% pLAD (recent FFR 0.83), diffusely diseased RCA,  widely patent stetns in pLAD and LCx; c. [facility] 12/14/15 w/ 3v CAD, patent LAD & LCx stents, OM2 75% small & unchanged, RCA diff 30-40 w/o obs dz, med Rx  . Diverticulitis   . Granulosa cell carcinoma ([place])    abd; last episode was in 2009  . Hyperlipidemia   . Hypertension   . LBBB (left bundle branch block)       . Myocardial infarction ([place]) 2002  . Obesity   . OSA on CPAP   . Pacemaker - St Jude Leadless PPM   . Peripheral vascular disease ([place])   . Permanent  atrial fibrillation ([place]) 2013  . Renal artery stenosis ([place])   . Tachycardia-bradycardia syndrome ([place])    a. s/p leadless pacemaker ([name]) implanted by Dr [name]  . Varicose veins     MEDICATIONS:  Prior to Admission medications   Medication Sig Start Date End Date Taking? Authorizing Provider  atorvastatin (LIPITOR) 80 MG tablet TAKE 1 TABLET BY MOUTH EVERY MORNING 06/03/16   [name], MD  beta carotene w/minerals (OCUVITE) tablet Take 1 tablet by mouth daily.     [provider]  Calcium Carb-Cholecalciferol (CALTRATE 600+D) 600-800 MG-UNIT TABS Take 1 tablet by mouth every morning.     [provider]  cephALEXin (KEFLEX) 500 MG capsule Take 500 mg by mouth 2 (two) times daily. 06/05/16   [provider]  clopidogrel (PLAVIX) 75 MG tablet TAKE 1 TABLET BY MOUTH EVERY DAY WITH BREAKFAST 05/12/16   [name], MD  Coenzyme Q-10 100 MG capsule Take 200 mg by mouth at bedtime.     [provider]  diltiazem (CARDIZEM CD) 120 MG 24 hr capsule Take 1 capsule (120 mg total) by mouth at bedtime. 01/18/16   Sherran Needs, NP  diltiazem (CARDIZEM CD) 180 MG 24 hr capsule TAKE ONE CAPSULE BY MOUTH DAILY AFTER BREAKFAST 07/07/16   Sherran Needs, NP  diltiazem [facility]) 180 MG 24 hr capsule Take 180 mg by mouth every morning.    [provider]  furosemide (LASIX) 40 MG tablet Take 80 mg by mouth daily.    [provider]  isosorbide mononitrate (IMDUR) 60 MG 24 hr tablet TAKE 1 TABLET (60 MG TOTAL) BY MOUTH DAILY. 06/03/16   Seiler, Amber K, NP  KLOR-CON M20 20 MEQ tablet TAKE 1 TABLET (20 MEQ TOTAL) BY MOUTH DAILY. 11/29/15   [name], MD  metoprolol (LOPRESSOR) 100 MG tablet TAKE 1 TABLET (100 MG TOTAL) BY MOUTH 2 (TWO) TIMES DAILY. 11/29/15   Allred, Jeneen Rinks, MD  Multiple Vitamin (MULTIVITAMIN) capsule Take 1 capsule by mouth daily.    [provider]  nitroGLYCERIN (NITROSTAT) 0.4 MG SL tablet Place 0.4  mg under the tongue every 5 (five) minutes as needed for chest pain. 3 dose max    [provider]  Omega-3 Fatty Acids (FISH OIL PO) Take 1,200 mg by mouth daily.     [provider]  spironolactone (ALDACTONE) 25 MG tablet Take 25 mg by mouth daily.  05/21/15   [name], MD  warfarin (COUMADIN) 2 MG tablet TAKE AS DIRECTED BY COUMADIN CLINIC 05/06/16   [name], MD    ALLERGIES:  Allergies  Allergen Reactions  . Bee Venom Anaphylaxis  . Other Nausea And Vomiting and Other (See Comments)    Pain medications cause severe vomiting. Tolerated slow IV morphine drip    SOCIAL HISTORY:  Social History  Substance Use Topics  .  Smoking status: Former Smoker    Packs/day: 1.00    Years: 32.00    Types: Cigarettes    Quit date: 02/03/1974  . Smokeless tobacco: Never Used  . Alcohol use 3.0 oz/week    5 Glasses of wine per week     Comment: 2017 WINE WITH DINNER     FAMILY HISTORY: Family History  Problem Relation Age of Onset  . Stroke Mother   . Heart attack Father   . Diabetes Father   . Hypertension Father   . Heart attack Brother   . Diabetes Brother   . Hypertension Brother   . Kidney failure Brother     EXAM: BP 114/88   Pulse 95   Temp 98.9 F (37.2 C)   Resp (!) 28   Ht 5' (1.524 m)   Wt 171 lb (77.6 kg)   SpO2 96%   BMI 33.40 kg/m    CONSTITUTIONAL: Alert and oriented and responds appropriately to questions. Well-appearing; well-nourished; Elderly HEAD: Normocephalic EYES: Conjunctivae clear, pupils appear equal, EOMI ENT: normal nose; moist mucous membranes NECK: Supple, no meningismus, no nuchal rigidity, no LAD  CARD: Irregularly irregular; S1 and S2 appreciated; no murmurs, no clicks, no rubs, no gallops RESP: Normal chest excursion without splinting or tachypnea; breath sounds clear and equal bilaterally; no wheezes, no rhonchi, no rales, no hypoxia or respiratory distress, speaking full sentences ABD/GI: Normal  bowel sounds; non-distended; soft, non-tender, no rebound, no guarding, no peritoneal signs, no hepatosplenomegaly BACK:  The back appears normal and is non-tender to palpation, there is no CVA tenderness EXT: Normal ROM in all joints; non-tender to palpation; no edema; normal capillary refill; no cyanosis, no calf tenderness or swelling    SKIN: Normal color for age and race; warm; no rash NEURO: Moves all extremities equally PSYCH: The patient's mood and manner are appropriate. Grooming and personal hygiene are appropriate.  MEDICAL DECISION MAKING: Patient here with worsening symptoms of shortness of breath and fatigue with exertion over the past couple of weeks. Now experiencing chest pain that she describes as feeling like a brick on her chest. Currently chest pain-free. EKG shows atrial flutter with 3-1 block. Will obtain cardiac labs, chest x-ray. She does not appear volume overloaded. Differential includes ACS, anemia, infection.    ED PROGRESS: 1:15 AM  Patient's labs show negative troponin. BNP mildly elevated at 361 but no signs of volume overload. Chest x-ray clear. INR is therapeutic. Patient now having a second episode of chest pain. We'll give more nitroglycerin. Repeat EKG shows atrial flutter with left bundle branch block that is unchanged compared to previous.  D/w Dr. Raiford Simmonds with cardiology service who will consult on patient in the morning. He recommends admission to medicine.   1:37 AM Discussed patient's case with hospitalist, Dr. Loleta Books.  I have recommended admission and patient (and family if present) agree with this plan. Admitting physician will place admission orders.   Given my concern for unstable angina, we'll start heparin.  I reviewed all nursing notes, vitals, pertinent previous records, EKGs, lab and urine results, imaging (as available).    EKG Interpretation  Date/Time:  Thursday July 10 2016 22:39:15 EDT Ventricular Rate:  94 PR Interval:    QRS  Duration: 160 QT Interval:  416 QTC Calculation: 521 R Axis:   -61 Text Interpretation:  Atrial flutter with predominant 3:1 AV block Left bundle branch block No STEMI.  Confirmed by Nanda Quinton [phone]) on 07/10/2016 10:43:18 PM  Date: 07/11/2016 1:13 AM  Rate: 99  Rhythm: Atrial flutter  QRS Axis: normal  Intervals: Left bundle branch block  ST/T Wave abnormalities: normal  Conduction Disutrbances: none  Narrative Interpretation: Atrial flutter, left bundle branch block, no change compared to previous    CRITICAL CARE Performed by: Nyra Jabs   Total critical care time: 40 minutes  Critical care time was exclusive of separately billable procedures and treating other patients.  Critical care was necessary to treat or prevent imminent or life-threatening deterioration.  Critical care was time spent personally by me on the following activities: development of treatment plan with patient and/or surrogate as well as nursing, discussions with consultants, evaluation of patient's response to treatment, examination of patient, obtaining history from patient or surrogate, ordering and performing treatments and interventions, ordering and review of laboratory studies, ordering and review of radiographic studies, pulse oximetry and re-evaluation of patient's condition.    I personally performed the services described in this documentation, which was scribed in my presence. The recorded information has been reviewed and is accurate.     Ward, Delice Bison, DO 07/11/16 [phone]

## 2016-07-10 NOTE — ED Triage Notes (Signed)
Pt comes via [facility] that started after dinner, substernal with radiation to R upper arm, with SOB, denies n/v, PTA received two nitro and 324 ASA. Nitro relieved pain.  Pt has a pacemaker, with a dead battery for the past three months. Pt in Afib

## 2016-07-11 ENCOUNTER — Encounter (HOSPITAL_COMMUNITY): Payer: Self-pay | Admitting: Family Medicine

## 2016-07-11 ENCOUNTER — Observation Stay (HOSPITAL_BASED_OUTPATIENT_CLINIC_OR_DEPARTMENT_OTHER): Payer: Medicare Other

## 2016-07-11 DIAGNOSIS — I209 Angina pectoris, unspecified: Secondary | ICD-10-CM | POA: Diagnosis not present

## 2016-07-11 DIAGNOSIS — D649 Anemia, unspecified: Secondary | ICD-10-CM | POA: Diagnosis not present

## 2016-07-11 DIAGNOSIS — Z7902 Long term (current) use of antithrombotics/antiplatelets: Secondary | ICD-10-CM | POA: Diagnosis not present

## 2016-07-11 DIAGNOSIS — I214 Non-ST elevation (NSTEMI) myocardial infarction: Secondary | ICD-10-CM | POA: Diagnosis not present

## 2016-07-11 DIAGNOSIS — Z87891 Personal history of nicotine dependence: Secondary | ICD-10-CM | POA: Diagnosis not present

## 2016-07-11 DIAGNOSIS — I25118 Atherosclerotic heart disease of native coronary artery with other forms of angina pectoris: Secondary | ICD-10-CM | POA: Diagnosis not present

## 2016-07-11 DIAGNOSIS — C569 Malignant neoplasm of unspecified ovary: Secondary | ICD-10-CM | POA: Diagnosis not present

## 2016-07-11 DIAGNOSIS — R072 Precordial pain: Secondary | ICD-10-CM

## 2016-07-11 DIAGNOSIS — Z7901 Long term (current) use of anticoagulants: Secondary | ICD-10-CM

## 2016-07-11 DIAGNOSIS — Z955 Presence of coronary angioplasty implant and graft: Secondary | ICD-10-CM | POA: Diagnosis not present

## 2016-07-11 DIAGNOSIS — I739 Peripheral vascular disease, unspecified: Secondary | ICD-10-CM | POA: Diagnosis present

## 2016-07-11 DIAGNOSIS — I4892 Unspecified atrial flutter: Secondary | ICD-10-CM

## 2016-07-11 DIAGNOSIS — I5042 Chronic combined systolic (congestive) and diastolic (congestive) heart failure: Secondary | ICD-10-CM | POA: Diagnosis not present

## 2016-07-11 DIAGNOSIS — I252 Old myocardial infarction: Secondary | ICD-10-CM | POA: Diagnosis not present

## 2016-07-11 DIAGNOSIS — Z91030 Bee allergy status: Secondary | ICD-10-CM | POA: Diagnosis not present

## 2016-07-11 DIAGNOSIS — I701 Atherosclerosis of renal artery: Secondary | ICD-10-CM | POA: Diagnosis present

## 2016-07-11 DIAGNOSIS — N390 Urinary tract infection, site not specified: Secondary | ICD-10-CM | POA: Diagnosis not present

## 2016-07-11 DIAGNOSIS — E876 Hypokalemia: Secondary | ICD-10-CM

## 2016-07-11 DIAGNOSIS — I481 Persistent atrial fibrillation: Secondary | ICD-10-CM | POA: Diagnosis not present

## 2016-07-11 DIAGNOSIS — I200 Unstable angina: Secondary | ICD-10-CM

## 2016-07-11 DIAGNOSIS — E785 Hyperlipidemia, unspecified: Secondary | ICD-10-CM | POA: Diagnosis not present

## 2016-07-11 DIAGNOSIS — I495 Sick sinus syndrome: Secondary | ICD-10-CM | POA: Diagnosis not present

## 2016-07-11 DIAGNOSIS — Z950 Presence of cardiac pacemaker: Secondary | ICD-10-CM | POA: Diagnosis not present

## 2016-07-11 DIAGNOSIS — I351 Nonrheumatic aortic (valve) insufficiency: Secondary | ICD-10-CM | POA: Diagnosis not present

## 2016-07-11 DIAGNOSIS — I10 Essential (primary) hypertension: Secondary | ICD-10-CM

## 2016-07-11 DIAGNOSIS — Z885 Allergy status to narcotic agent status: Secondary | ICD-10-CM | POA: Diagnosis not present

## 2016-07-11 DIAGNOSIS — I480 Paroxysmal atrial fibrillation: Secondary | ICD-10-CM | POA: Diagnosis not present

## 2016-07-11 DIAGNOSIS — R0609 Other forms of dyspnea: Secondary | ICD-10-CM | POA: Diagnosis not present

## 2016-07-11 DIAGNOSIS — Z9989 Dependence on other enabling machines and devices: Secondary | ICD-10-CM | POA: Diagnosis not present

## 2016-07-11 DIAGNOSIS — I483 Typical atrial flutter: Secondary | ICD-10-CM

## 2016-07-11 DIAGNOSIS — I110 Hypertensive heart disease with heart failure: Secondary | ICD-10-CM | POA: Diagnosis not present

## 2016-07-11 DIAGNOSIS — Z8249 Family history of ischemic heart disease and other diseases of the circulatory system: Secondary | ICD-10-CM | POA: Diagnosis not present

## 2016-07-11 DIAGNOSIS — I25110 Atherosclerotic heart disease of native coronary artery with unstable angina pectoris: Secondary | ICD-10-CM | POA: Diagnosis not present

## 2016-07-11 DIAGNOSIS — Z823 Family history of stroke: Secondary | ICD-10-CM | POA: Diagnosis not present

## 2016-07-11 DIAGNOSIS — I5040 Unspecified combined systolic (congestive) and diastolic (congestive) heart failure: Secondary | ICD-10-CM | POA: Diagnosis not present

## 2016-07-11 DIAGNOSIS — I2510 Atherosclerotic heart disease of native coronary artery without angina pectoris: Secondary | ICD-10-CM | POA: Diagnosis not present

## 2016-07-11 DIAGNOSIS — R079 Chest pain, unspecified: Secondary | ICD-10-CM | POA: Diagnosis not present

## 2016-07-11 DIAGNOSIS — Z833 Family history of diabetes mellitus: Secondary | ICD-10-CM | POA: Diagnosis not present

## 2016-07-11 DIAGNOSIS — I4891 Unspecified atrial fibrillation: Secondary | ICD-10-CM | POA: Diagnosis not present

## 2016-07-11 DIAGNOSIS — G4733 Obstructive sleep apnea (adult) (pediatric): Secondary | ICD-10-CM | POA: Diagnosis not present

## 2016-07-11 DIAGNOSIS — I447 Left bundle-branch block, unspecified: Secondary | ICD-10-CM | POA: Diagnosis present

## 2016-07-11 LAB — BASIC METABOLIC PANEL
ANION GAP: 8 (ref 5–15)
Anion gap: 11 (ref 5–15)
BUN: 14 mg/dL (ref 6–20)
BUN: 17 mg/dL (ref 6–20)
CALCIUM: 8.1 mg/dL — AB (ref 8.9–10.3)
CHLORIDE: 100 mmol/L — AB (ref 101–111)
CO2: 27 mmol/L (ref 22–32)
CO2: 29 mmol/L (ref 22–32)
Calcium: 8.1 mg/dL — ABNORMAL LOW (ref 8.9–10.3)
Chloride: 102 mmol/L (ref 101–111)
Creatinine, Ser: 0.98 mg/dL (ref 0.44–1.00)
Creatinine, Ser: 1.06 mg/dL — ABNORMAL HIGH (ref 0.44–1.00)
GFR calc Af Amer: 55 mL/min — ABNORMAL LOW (ref >60)
GFR calc non Af Amer: 52 mL/min — ABNORMAL LOW (ref >60)
GFR, EST NON AFRICAN AMERICAN: 47 mL/min — AB (ref >60)
Glucose, Bld: 129 mg/dL — ABNORMAL HIGH (ref 65–99)
Glucose, Bld: 135 mg/dL — ABNORMAL HIGH (ref 65–99)
POTASSIUM: 3.1 mmol/L — AB (ref 3.5–5.1)
POTASSIUM: 3.2 mmol/L — AB (ref 3.5–5.1)
SODIUM: 138 mmol/L (ref 135–145)
Sodium: 139 mmol/L (ref 135–145)

## 2016-07-11 LAB — ECHOCARDIOGRAM COMPLETE
AO mean calculated velocity dopler: 123 cm/s
AOASC: 39 cm
AOPV: 0.65 m/s
AOVTI: 32.2 cm
AV Area VTI index: 1.04 cm2/m2
AV Area mean vel: 1.78 cm2
AV Peak grad: 16 mmHg
AV VEL mean LVOT/AV: 0.63
AV area mean vel ind: 0.97 cm2/m2
AV peak Index: 1.01
AV pk vel: 201 cm/s
AV vel: 1.9
AVAREAVTI: 1.85 cm2
AVG: 8 mmHg
AVPHT: 511 ms
CHL CUP DOP CALC LVOT VTI: 21.5 cm
CHL CUP MV DEC (S): 250
CHL CUP RV SYS PRESS: 28 mmHg
CHL CUP TV REG PEAK VELOCITY: 252 cm/s
E decel time: 250 msec
FS: 38 % (ref 28–44)
Height: 60 in
IV/PV OW: 1.25
LA ID, A-P, ES: 53 mm
LA diam index: 2.9 cm/m2
LA vol: 117 mL
LAVOLA4C: 87.4 mL
LAVOLIN: 63.9 mL/m2
LEFT ATRIUM END SYS DIAM: 53 mm
LV dias vol index: 51 mL/m2
LV dias vol: 94 mL (ref 46–106)
LV sys vol: 46 mL — AB (ref 14–42)
LVOT SV: 61 mL
LVOT area: 2.84 cm2
LVOT diameter: 19 mm
LVOT peak grad rest: 7 mmHg
LVOTPV: 131 cm/s
LVOTVTI: 0.67 cm
LVSYSVOLIN: 25 mL/m2
Lateral S' vel: 15.1 cm/s
MV Peak grad: 8 mmHg
MV pk E vel: 137 m/s
MVAP: 3.14 cm2
MVPKAVEL: 48.1 m/s
MVSPHT: 73 ms
PW: 12 mm — AB (ref 0.6–1.1)
Simpson's disk: 51
Stroke v: 48 ml
TAPSE: 19.4 mm
TRMAXVEL: 252 cm/s
Valve area index: 1.04
Valve area: 1.9 cm2
Weight: 2687.85 oz

## 2016-07-11 LAB — I-STAT TROPONIN, ED: TROPONIN I, POC: 0.02 ng/mL (ref 0.00–0.08)

## 2016-07-11 LAB — PROTIME-INR
INR: 2.44
INR: 2.63
PROTHROMBIN TIME: 28.6 s — AB (ref 11.4–15.2)
Prothrombin Time: 26.9 seconds — ABNORMAL HIGH (ref 11.4–15.2)

## 2016-07-11 LAB — IRON AND TIBC
IRON: 20 ug/dL — AB (ref 28–170)
SATURATION RATIOS: 8 % — AB (ref 10.4–31.8)
TIBC: 262 ug/dL (ref 250–450)
UIBC: 242 ug/dL

## 2016-07-11 LAB — URINALYSIS, ROUTINE W REFLEX MICROSCOPIC
Bilirubin Urine: NEGATIVE
GLUCOSE, UA: NEGATIVE mg/dL
HGB URINE DIPSTICK: NEGATIVE
KETONES UR: NEGATIVE mg/dL
NITRITE: NEGATIVE
PH: 6 (ref 5.0–8.0)
PROTEIN: 30 mg/dL — AB
Specific Gravity, Urine: 1.02 (ref 1.005–1.030)

## 2016-07-11 LAB — LIPID PANEL
CHOLESTEROL: 107 mg/dL (ref 0–200)
HDL: 38 mg/dL — AB (ref >40)
LDL Cholesterol: 60 mg/dL (ref 0–99)
TRIGLYCERIDES: 46 mg/dL (ref <150)
Total CHOL/HDL Ratio: 2.8 RATIO
VLDL: 9 mg/dL (ref 0–40)

## 2016-07-11 LAB — TROPONIN I
Troponin I: 0.03 ng/mL (ref <0.03)
Troponin I: 0.04 ng/mL (ref <0.03)
Troponin I: 0.06 ng/mL (ref <0.03)

## 2016-07-11 LAB — RETICULOCYTES
RBC.: 3.3 MIL/uL — ABNORMAL LOW (ref 3.87–5.11)
RETIC CT PCT: 2.5 % (ref 0.4–3.1)
Retic Count, Absolute: 82.5 10*3/uL (ref 19.0–186.0)

## 2016-07-11 LAB — BRAIN NATRIURETIC PEPTIDE: B NATRIURETIC PEPTIDE 5: 361.3 pg/mL — AB (ref 0.0–100.0)

## 2016-07-11 LAB — VITAMIN B12: VITAMIN B 12: 560 pg/mL (ref 180–914)

## 2016-07-11 LAB — TSH: TSH: 2.36 u[IU]/mL (ref 0.350–4.500)

## 2016-07-11 LAB — MAGNESIUM: Magnesium: 1.9 mg/dL (ref 1.7–2.4)

## 2016-07-11 LAB — FERRITIN: FERRITIN: 201 ng/mL (ref 11–307)

## 2016-07-11 LAB — MRSA PCR SCREENING: MRSA by PCR: NEGATIVE

## 2016-07-11 MED ORDER — DILTIAZEM HCL ER COATED BEADS 180 MG PO CP24
180.0000 mg | ORAL_CAPSULE | Freq: Every day | ORAL | Status: DC
  Administered 2016-07-11: 180 mg via ORAL
  Filled 2016-07-11 (×2): qty 1

## 2016-07-11 MED ORDER — FUROSEMIDE 80 MG PO TABS
80.0000 mg | ORAL_TABLET | Freq: Every day | ORAL | Status: DC
  Administered 2016-07-11 – 2016-07-16 (×5): 80 mg via ORAL
  Filled 2016-07-11 (×6): qty 1

## 2016-07-11 MED ORDER — ASPIRIN 300 MG RE SUPP: 300.0000 mg | RECTAL | Status: AC

## 2016-07-11 MED ORDER — METOPROLOL TARTRATE 5 MG/5ML IV SOLN
INTRAVENOUS | Status: AC
  Administered 2016-07-11: 10 mg via INTRAVENOUS
  Filled 2016-07-11: qty 10

## 2016-07-11 MED ORDER — METOPROLOL TARTRATE 25 MG PO TABS
100.0000 mg | ORAL_TABLET | Freq: Two times a day (BID) | ORAL | Status: DC
  Administered 2016-07-11 – 2016-07-15 (×9): 100 mg via ORAL
  Filled 2016-07-11 (×3): qty 1
  Filled 2016-07-11: qty 4
  Filled 2016-07-11 (×2): qty 2
  Filled 2016-07-11: qty 4
  Filled 2016-07-11: qty 1
  Filled 2016-07-11: qty 4

## 2016-07-11 MED ORDER — NITROGLYCERIN 0.4 MG SL SUBL
0.4000 mg | SUBLINGUAL_TABLET | SUBLINGUAL | Status: DC | PRN
  Administered 2016-07-11 (×2): 0.4 mg via SUBLINGUAL
  Filled 2016-07-11 (×2): qty 1

## 2016-07-11 MED ORDER — ATORVASTATIN CALCIUM 80 MG PO TABS
80.0000 mg | ORAL_TABLET | Freq: Every morning | ORAL | Status: DC
  Administered 2016-07-11 – 2016-07-16 (×6): 80 mg via ORAL
  Filled 2016-07-11 (×6): qty 1

## 2016-07-11 MED ORDER — ACETAMINOPHEN 325 MG PO TABS
650.0000 mg | ORAL_TABLET | ORAL | Status: DC | PRN
  Administered 2016-07-15: 650 mg via ORAL
  Filled 2016-07-11: qty 2

## 2016-07-11 MED ORDER — METOPROLOL TARTRATE 5 MG/5ML IV SOLN
10.0000 mg | Freq: Once | INTRAVENOUS | Status: AC
  Administered 2016-07-11: 10 mg via INTRAVENOUS

## 2016-07-11 MED ORDER — POTASSIUM CHLORIDE CRYS ER 20 MEQ PO TBCR
60.0000 meq | EXTENDED_RELEASE_TABLET | Freq: Two times a day (BID) | ORAL | Status: AC
  Administered 2016-07-11: 60 meq via ORAL
  Filled 2016-07-11: qty 3

## 2016-07-11 MED ORDER — ASPIRIN 81 MG PO CHEW: 324.0000 mg | CHEWABLE_TABLET | ORAL | Status: AC

## 2016-07-11 MED ORDER — DILTIAZEM HCL ER COATED BEADS 120 MG PO CP24
120.0000 mg | ORAL_CAPSULE | Freq: Every day | ORAL | Status: DC
  Administered 2016-07-11 – 2016-07-15 (×5): 120 mg via ORAL
  Filled 2016-07-11 (×6): qty 1

## 2016-07-11 MED ORDER — CLOPIDOGREL BISULFATE 75 MG PO TABS
75.0000 mg | ORAL_TABLET | Freq: Every day | ORAL | Status: DC
  Administered 2016-07-11 – 2016-07-16 (×6): 75 mg via ORAL
  Filled 2016-07-11 (×6): qty 1

## 2016-07-11 MED ORDER — METOPROLOL TARTRATE 5 MG/5ML IV SOLN
5.0000 mg | INTRAVENOUS | Status: DC | PRN
  Administered 2016-07-11: 5 mg via INTRAVENOUS
  Filled 2016-07-11: qty 5

## 2016-07-11 MED ORDER — ISOSORBIDE MONONITRATE ER 60 MG PO TB24
60.0000 mg | ORAL_TABLET | Freq: Every day | ORAL | Status: DC
  Administered 2016-07-11 – 2016-07-16 (×6): 60 mg via ORAL
  Filled 2016-07-11 (×6): qty 1

## 2016-07-11 MED ORDER — ONDANSETRON HCL 4 MG/2ML IJ SOLN: 4.0000 mg | Freq: Four times a day (QID) | INTRAMUSCULAR | Status: DC | PRN

## 2016-07-11 MED ORDER — METOPROLOL TARTRATE 5 MG/5ML IV SOLN: 5.0000 mg | INTRAVENOUS | Status: DC

## 2016-07-11 MED ORDER — NITROGLYCERIN 0.4 MG SL SUBL
0.4000 mg | SUBLINGUAL_TABLET | SUBLINGUAL | Status: DC | PRN
  Administered 2016-07-11 (×3): 0.4 mg via SUBLINGUAL
  Filled 2016-07-11 (×3): qty 1

## 2016-07-11 MED ORDER — METOPROLOL TARTRATE 5 MG/5ML IV SOLN
INTRAVENOUS | Status: AC
  Filled 2016-07-11: qty 30

## 2016-07-11 MED ORDER — POTASSIUM CHLORIDE CRYS ER 20 MEQ PO TBCR
40.0000 meq | EXTENDED_RELEASE_TABLET | Freq: Two times a day (BID) | ORAL | Status: DC
  Administered 2016-07-11: 40 meq via ORAL
  Filled 2016-07-11: qty 2

## 2016-07-11 NOTE — Progress Notes (Signed)
ANTICOAGULATION CONSULT NOTE - Follow Up Consult  Pharmacy Consult for Heparin Indication: atrial fibrillation  Allergies  Allergen Reactions  . Bee Venom Anaphylaxis  . Other Nausea And Vomiting and Other (See Comments)    Pain medications cause severe vomiting. Tolerated slow IV morphine drip    Patient Measurements: Height: 5' (152.4 cm) Weight: 167 lb 15.9 oz (76.2 kg) IBW/kg (Calculated) : 45.5 Heparin Dosing Weight:   Vital Signs: Temp: 100 F (37.8 C) (06/08 1201) Temp Source: Oral (06/08 1201) BP: 115/78 (06/08 1201) Pulse Rate: 89 (06/08 1201)  Labs:  Recent Labs  07/10/16 2340 07/11/16 0414 07/11/16 0926 07/11/16 1147  HGB 10.5*  --   --   --   HCT 32.9*  --   --   --   PLT 367  --   --   --   LABPROT 28.6*  --   --  26.9*  INR 2.63  --   --  2.44  CREATININE 1.06*  --  0.98  --   TROPONINI 0.03* 0.04* 0.06*  --     Estimated Creatinine Clearance: 39.7 mL/min (by C-G formula based on SCr of 0.98 mg/dL).  Assessment: Anticoag: Afib on Coumadin PTA. Hgb 10.5. INR 2.63>2.44 today  Goal of Therapy:  Heparin level 0.3-0.7 units/ml Monitor platelets by anticoagulation protocol: Yes   Plan:  -Heparin when INR <2.1   Patty [name], PharmD, BCPS Clinical Staff Pharmacist Pager [phone]  Patty [name] 07/11/2016,1:00 PM

## 2016-07-11 NOTE — Consult Note (Signed)
The patient has been seen in conjunction with Patty [name], PAC. All aspects of care have been considered and discussed. The patient has been personally interviewed, examined, and all clinical data has been reviewed.   Complicated cardiac history including CAD with multiple stents, chronic combined systolic and diastolic heart failure, peripheral vascular disease, tachybradycardia syndrome, permanent atrial fibrillation, obstructive sleep apnea, who presented with recurring episodes of chest discomfort similar to prior episodes of unstable angina. In reviewing coronary anatomy she has ostial LAD disease with prior stenting.  Symptoms are compatible with unstable angina/non-ST elevation MI. Potassium needs to be repleted. INR was 2.6 at 11:40PM and will not allow coronary angiography today. Plan hold Coumadin, start heparin, and plan cath early next week.  Will need precath orders written on Sunday.   Cardiology Consultation:   Patient ID: Patty [name]; [id number]; [birth year]   Admit date: 07/10/2016 Date of Consult: 07/11/2016  Primary Care Provider: [name], MD Primary Cardiologist: Dr. Irish [name]  Primary Electrophysiologist:  Dr. Rayann [name]   Patient Profile:   Patty [name] is a 83 y.o. female with a hx of CAD s/p multiple stents, LBBB, granulosa cell carcinoma (diagnosed 2002),chronic combined s/d CHF, PVD, HTN, HLD, tachy-brady s/p leadless PPM (battery has died prematurally), permanent AFib/flutter on coumadin and OSA on CPAP  who is being seen today for the evaluation of chest pain at the request of Dr. Wynetta [name].   She has significant CAD with stenting x 2 to the LAD and x 1 to the LCx in 2000, rotational arthrectomy to proximal LCx and DES to LAD in 2014 and DES to LCx in 2014, along with DES to LAD for re-in-stent stenosis in 2015, and DES to ostial LAD in 2016.  Last cath 12/14/15 showed  Prox RCA lesion, 30 %stenosed.  Mid RCA-1 lesion, 40 %stenosed.  Mid RCA-2  lesion, 30 %stenosed.  Dist RCA lesion, 100 %stenosed.  Ost RPDA to RPDA lesion, 50 %stenosed.  Ost 2nd Mrg to 2nd Mrg lesion, 75 %stenosed.  Ost Cx to Prox Cx lesion, 10 %stenosed.  Ost 1st Mrg to 1st Mrg lesion, 100 %stenosed.  Mid Cx lesion, 65 %stenosed.  Ost LAD to Mid LAD lesion, 20 %stenosed.   1. Triple vessel CAD 2. Patent stents in the proximal and mid LAD with minimal restenosis. The mid and distal LAD has no obstructive disease.  3. The Circumflex has a patent proximal stent with minimal restenosis. The first OM branch is occluded and fills from left to left collaterals. The second OM branch is a small caliber vessel with ostial 75% stenosis, unchanged from last cath and too small for PCI.  4. The RCA is a large dominant vessel with diffuse 30-40% calcific stenosis but no obstructive disease.   Recommendations: No focal targets for PCI although she has diffuse CAD and it is not surprising that she may have a mildly elevated troponin in the setting of elevated heart rate with atrial fibrillation due to demand ischemia. Continue medical management of CAD.   Last seen by Dr. Rayann [name] 04/14/16. Her battery had depleted prematurely. He is on watchful waiting currently. Bradycardia then pacing.   History of Present Illness:   Patty [name] feeling fatigued and weak for the past couple of weeks. She also has exertional dyspnea and chest tightness more than usual. Symptoms improved with sublingual nitroglycerin however, recently worsening past few days. Yesterday had a worse episode with ambulation. She also feels palpitation intermittently. Last night after dinner her symptoms exacerbated  and EMS was called. Symptoms are worse improved with nitroglycerin and brought to ER. She did not take diltiazem or metoprolol last evening.  EKG shows A. Fib/ flutter, LBBB at control rate. K3.2. INR 2.6. BNP tendon 61. Chest x-ray without edema or pneumonia.  Currently she complains of  shortness of breath, chest tightness and palpitation. On monitor she is going at rate of 130-140 in A. fib/A flutter.   Past Medical History:  Diagnosis Date  . Chronic anticoagulation - coumadin, CHADS2VASC=6 05/17/2015  . Combined systolic and diastolic heart failure, NYHA class 3 (Patty [name])   . Coronary artery disease    a. s/p multiple stents; b. LHC 12/14/13 with sig CAD with TO of OM1, 70-80% occusion OM2, 50-70% mLCx, 50-70% pLAD (recent FFR 0.83), diffusely diseased RCA, widely patent stetns in pLAD and LCx; c. [facility] 12/14/15 w/ 3v CAD, patent LAD & LCx stents, OM2 75% small & unchanged, RCA diff 30-40 w/o obs dz, med Rx  . Diverticulitis   . Granulosa cell carcinoma (Patty [name])    abd; last episode was in 2009  . Hyperlipidemia   . Hypertension   . LBBB (left bundle branch block)       . Myocardial infarction (Patty [name]) 2002  . Obesity   . OSA on CPAP   . Pacemaker - St Jude Leadless PPM   . Peripheral vascular disease (Patty [name])   . Permanent atrial fibrillation (Patty [name]) 2013  . Renal artery stenosis (Patty [name])   . Tachycardia-bradycardia syndrome (Patty [name])    a. s/p leadless pacemaker ([name]) implanted by Dr Patty [name]  . Varicose veins     Past Surgical History:  Procedure Laterality Date  . ABDOMINAL HYSTERECTOMY    . CARDIAC CATHETERIZATION  09/03/2007   EF 70%; Failed attempt at PCI to OM  . CARDIAC CATHETERIZATION  11/01/2003   EF 70%  . CARDIAC CATHETERIZATION N/A 12/14/2015   Procedure: Left Heart Cath and Coronary Angiography;  Surgeon: Patty Blanks, MD;  Location: [place] CV LAB;  Service: Cardiovascular;  Laterality: N/A;  . CARDIOVERSION  12/31/2011   Procedure: CARDIOVERSION;  Surgeon: Patty Booze, MD;  Location: [facility] ENDOSCOPY;  Service: Cardiovascular;  Laterality: N/A;  . CARDIOVERSION N/A 12/31/2011   Procedure: CARDIOVERSION;  Surgeon: Patty Booze, MD;  Location: [facility] CATH LAB;  Service: Cardiovascular;  Laterality: N/A;  . CATARACT EXTRACTION, BILATERAL  2015   . CHOLECYSTECTOMY  1980's  . COLON SURGERY  2004   colectomy for diverticulosis  . CORONARY ANGIOPLASTY WITH STENT PLACEMENT  2000; 08/11/2012; 11/12/2012   3 + 2 LAD & CFX; 2nd CFX stent 11/12/2012  . FRACTIONAL FLOW RESERVE WIRE  10/07/2013   Procedure: FRACTIONAL FLOW RESERVE WIRE;  Surgeon: Patty Booze, MD;  Location: [facility] CATH LAB;  Service: Cardiovascular;;  . granulosa tumor excision  2000; 2003; 2004; 2007   "all in my abdomen including small intestines, outside my ?uterus/etc" (08/11/2012)  . HERNIA REPAIR  2005   "laparoscopic"  . LEFT HEART CATHETERIZATION WITH CORONARY ANGIOGRAM N/A 11/12/2012   Procedure: LEFT HEART CATHETERIZATION WITH CORONARY ANGIOGRAM;  Surgeon: Patty Booze, MD;  Location: [facility] CATH LAB;  Service: Cardiovascular;  Laterality: N/A;  . LEFT HEART CATHETERIZATION WITH CORONARY ANGIOGRAM N/A 10/07/2013   Procedure: LEFT HEART CATHETERIZATION WITH CORONARY ANGIOGRAM;  Surgeon: Patty Booze, MD;  Location: [facility] CATH LAB;  Service: Cardiovascular;  Laterality: N/A;  . LEFT HEART CATHETERIZATION WITH CORONARY ANGIOGRAM N/A 12/14/2013   Procedure: LEFT HEART CATHETERIZATION WITH CORONARY ANGIOGRAM;  Surgeon:  Sinclair Grooms, MD;  Location: [facility] CATH LAB;  Service: Cardiovascular;  Laterality: N/A;  . LEFT HEART CATHETERIZATION WITH CORONARY ANGIOGRAM N/A 05/16/2014   Procedure: LEFT HEART CATHETERIZATION WITH CORONARY ANGIOGRAM;  Surgeon: Sherren Mocha, MD;  Location: [facility] CATH LAB;  Service: Cardiovascular;  Laterality: N/A;  . PERCUTANEOUS CORONARY INTERVENTION-BALLOON ONLY  08/04/2012   Procedure: PERCUTANEOUS CORONARY INTERVENTION-BALLOON ONLY;  Surgeon: Patty Booze, MD;  Location: [facility] CATH LAB;  Service: Cardiovascular;;  . PERCUTANEOUS CORONARY ROTOBLATOR INTERVENTION (PCI-R) N/A 08/11/2012   Procedure: PERCUTANEOUS CORONARY ROTOBLATOR INTERVENTION (PCI-R);  Surgeon: Patty Booze, MD;  Location: [facility] CATH LAB;  Service: Cardiovascular;   Laterality: N/A;  . PERCUTANEOUS CORONARY STENT INTERVENTION (PCI-S)  10/07/2013   Procedure: PERCUTANEOUS CORONARY STENT INTERVENTION (PCI-S);  Surgeon: Patty Booze, MD;  Location: [facility] CATH LAB;  Service: Cardiovascular;;  . PERMANENT PACEMAKER INSERTION N/A 03/16/2012   [name] (SJM) leadless pacemaker (LEADLESS II STUDY PATEINT)  . SALIVARY GLAND SURGERY  2000's   "had a little lump removed; granulosa related; it was benign" (08/11/2012)  . TEE WITHOUT CARDIOVERSION  12/31/2011   Procedure: TRANSESOPHAGEAL ECHOCARDIOGRAM (TEE);  Surgeon: Patty Booze, MD;  Location: [place];  Service: Cardiovascular;  Laterality: N/A;  . VARICOSE VEIN SURGERY Bilateral 1977     Inpatient Medications: Scheduled Meds: . aspirin  324 mg Oral NOW   Or  . aspirin  300 mg Rectal NOW  . atorvastatin  80 mg Oral q morning - 10a  . clopidogrel  75 mg Oral Daily  . diltiazem  120 mg Oral QHS  . diltiazem  180 mg Oral Daily  . furosemide  80 mg Oral Daily  . isosorbide mononitrate  60 mg Oral Daily  . metoprolol tartrate  10 mg Intravenous Once  . metoprolol tartrate  100 mg Oral BID  . potassium chloride  40 mEq Oral BID   Continuous Infusions:  PRN Meds: acetaminophen, nitroGLYCERIN, ondansetron (ZOFRAN) IV  Allergies:    Allergies  Allergen Reactions  . Bee Venom Anaphylaxis  . Other Nausea And Vomiting and Other (See Comments)    Pain medications cause severe vomiting. Tolerated slow IV morphine drip    Social History:   Social History   Social History  . Marital status: Widowed    Spouse name: N/A  . Number of children: 4  . Years of education: N/A   Occupational History  . Retired Counsellor    Social History Main Topics  . Smoking status: Former Smoker    Packs/day: 1.00    Years: 32.00    Types: Cigarettes    Quit date: 02/03/1974  . Smokeless tobacco: Never Used  . Alcohol use 3.0 oz/week    5 Glasses of wine per week     Comment: 2017 WINE WITH  DINNER   . Drug use: No  . Sexual activity: No   Other Topics Concern  . Not on file   Social History Narrative   Lives with family.    Family History:   The patient's family history includes Diabetes in her brother and father; Heart attack in her brother and father; Hypertension in her brother and father; Kidney failure in her brother; Stroke in her mother.  ROS:  Please see the history of present illness.  ROS All other ROS reviewed and negative.     Physical Exam/Data:   Vitals:   07/11/16 9563 07/11/16 0657 07/11/16 0703 07/11/16 0818  BP:  128/89 123/75 (!) 147/90  Pulse: (!) 144 (!) 141 (!) 144 (!) 146  Resp: 16 (!) 25 (!) 27 (!) 34  Temp:    99.3 F (37.4 C)  TempSrc:    Oral  SpO2: 98% 94% 95% 97%  Weight:      Height:       No intake or output data in the 24 hours ending 07/11/16 0836 Filed Weights   07/10/16 2239 07/11/16 0334  Weight: 171 lb (77.6 kg) 167 lb 15.9 oz (76.2 kg)   Body mass index is 32.81 kg/m.  General:  Well nourished, well developed, in no acute distress HEENT: normal Lymph: no adenopathy Neck: no JVD Endocrine:  No thryomegaly Vascular: No carotid bruits; FA pulses 2+ bilaterally without bruits  Cardiac:  Irregular tachycardic S1, S2; RRR; no murmur  Lungs:  clear to auscultation bilaterally, no wheezing, rhonchi or rales  Abd: soft, nontender, no hepatomegaly  Ext: no edema Musculoskeletal:  No deformities, BUE and BLE strength normal and equal Skin: warm and dry  Neuro:  CNs 2-12 intact, no focal abnormalities noted Psych:  Normal affect   Laboratory Data:  Chemistry Recent Labs Lab 07/10/16 2340  NA 138  K 3.2*  CL 100*  CO2 27  GLUCOSE 129*  BUN 17  CREATININE 1.06*  CALCIUM 8.1*  GFRNONAA 47*  GFRAA 55*  ANIONGAP 11    No results for input(s): PROT, ALBUMIN, AST, ALT, ALKPHOS, BILITOT in the last 168 hours. Hematology Recent Labs Lab 07/10/16 2340 07/11/16 0414  WBC 12.2*  --   RBC 3.40* 3.30*  HGB 10.5*   --   HCT 32.9*  --   MCV 96.8  --   MCH 30.9  --   MCHC 31.9  --   RDW 14.9  --   PLT 367  --     CARDIAC IMAGING: Coronary angiography 12/14/15: Coronary Diagrams   Diagnostic Diagram         Cardiac Enzymes Recent Labs Lab 07/10/16 2340 07/11/16 0414  TROPONINI 0.03* 0.04*    Recent Labs Lab 07/11/16 0005  TROPIPOC 0.02    BNP Recent Labs Lab 07/10/16 2340  BNP 361.3*    DDimer No results for input(s): DDIMER in the last 168 hours.  Radiology/Studies:  Dg Chest 2 View  Result Date: 07/10/2016 CLINICAL DATA:  Substernal chest pain. EXAM: CHEST  2 VIEW COMPARISON:  12/13/2015 FINDINGS: Stable cardiomegaly and atherosclerosis of the thoracic aorta. Implanted loop recorder projects over the left chest. No evidence of pulmonary edema, pleural effusion, focal airspace disease or pneumothorax. Stable osseous structures. IMPRESSION: Stable cardiomegaly and atherosclerosis of the thoracic aorta. No evidence of congestive failure or acute abnormality. Electronically Signed   By: Jeb Levering M.D.   On: 07/10/2016 23:18    Assessment and Plan:   1. Atrial fibrillation/flutter with RVR - Rate elevated to 130-140 this morning. We will give IV metoprolol 10 mg x 1 and PO Cardizem CD 180 mg daily dose. Continue daily metoprolol 100mg  BID (she did not received her PM dose last night). TSH normal. Pending echo (if EF is severely depressed --> consider discontinuation of CCB) - Her symptoms could be due to elevated rate rather then angina. Follow closely. Likely rate control strategy first and then evaluation for angina.  - INR 2.63. Coumadin on hold for possible intervention. Start heparin once INR less than 2.  2. Chest pain with history of CAD status post multiple PCI - Last cath as above. Her symptoms is also concerning for unstable  angina. Troponin 0.03-->0.04. Continue cycle troponin. Plan per MD  3.  Tachy-brady s/p leadless PPM (battery has died prematurally) -  Monitor on telemetric very closely.  4. Hypokalemia - Supplement given  5. Chronic systolic and diastolic CHF - Last echocardiogram 05/2014 shows LV function of 45-60%.BNP minimally elevated this admission. She is euvolemic. Chest x-ray without edema. Pending echocardiogram this admission.  Dr. Tamala Julian to see later today.     Jarrett Soho, PA  07/11/2016 8:36 AM

## 2016-07-11 NOTE — Progress Notes (Signed)
  Echocardiogram 2D Echocardiogram has been performed.  Patty [name] 07/11/2016, 12:06 PM

## 2016-07-11 NOTE — Progress Notes (Signed)
ANTICOAGULATION CONSULT NOTE - Initial Consult  Pharmacy Consult for Heparin (hold warfarin) Indication: chest pain/ACS and atrial fibrillation  Allergies  Allergen Reactions  . Bee Venom Anaphylaxis  . Other Nausea And Vomiting and Other (See Comments)    Pain medications cause severe vomiting. Tolerated slow IV morphine drip    Patient Measurements: Height: 5' (152.4 cm) Weight: 171 lb (77.6 kg) IBW/kg (Calculated) : 45.5  Vital Signs: Temp: 98.9 F (37.2 C) (06/07 2239) BP: 140/95 (06/08 0130) Pulse Rate: 40 (06/08 0130)  Labs:  Recent Labs  07/10/16 2340  HGB 10.5*  HCT 32.9*  PLT 367  LABPROT 28.6*  INR 2.63  CREATININE 1.06*  TROPONINI 0.03*    Estimated Creatinine Clearance: 37 mL/min (A) (by C-G formula based on SCr of 1.06 mg/dL (H)).   Medical History: Past Medical History:  Diagnosis Date  . Chronic anticoagulation - coumadin, CHADS2VASC=6 05/17/2015  . Combined systolic and diastolic heart failure, NYHA class 3 ([place])   . Coronary artery disease    a. s/p multiple stents; b. LHC 12/14/13 with sig CAD with TO of OM1, 70-80% occusion OM2, 50-70% mLCx, 50-70% pLAD (recent FFR 0.83), diffusely diseased RCA, widely patent stetns in pLAD and LCx; c. [facility] 12/14/15 w/ 3v CAD, patent LAD & LCx stents, OM2 75% small & unchanged, RCA diff 30-40 w/o obs dz, med Rx  . Diverticulitis   . Granulosa cell carcinoma ([place])    abd; last episode was in 2009  . Hyperlipidemia   . Hypertension   . LBBB (left bundle branch block)       . Myocardial infarction ([place]) 2002  . Obesity   . OSA on CPAP   . Pacemaker - St Jude Leadless PPM   . Peripheral vascular disease ([place])   . Permanent atrial fibrillation ([place]) 2013  . Renal artery stenosis ([place])   . Tachycardia-bradycardia syndrome ([place])    a. s/p leadless pacemaker ([name]) implanted by Dr [name]  . Varicose veins     Assessment: 83 y/o F on warfarin PTA for afib, here with CP, for heparin, INR is 2.63  Goal of  Therapy:  Heparin level 0.3-0.7 units/ml Monitor platelets by anticoagulation protocol: Yes   Plan:  -Heparin when INR <2  Narda Bonds 07/11/2016,1:43 AM

## 2016-07-11 NOTE — Progress Notes (Signed)
07/10/2016 10:29 PM  07/11/2016 8:04 AM  Patty [name] was seen and examined.  The H&P by the admitting provider, orders, imaging was reviewed.  Please see new orders.  Will continue to follow.   [name], MD [facility]

## 2016-07-11 NOTE — H&P (Signed)
History and Physical  Patient Name: Patty [name]     ZOX:[id number]    DOB: 1930-02-26    DOA: 07/10/2016 PCP: [name], MD  Patient coming from: Home  Chief Complaint: Chest pain      HPI: Patty [name] is a 83 y.o. female with a past medical history significant for CAD s/p PCI, Afib on warfarin, CHF EF 45%, tachy-brady s/p PPM battery now dead, and HTN who presents with 2 weeks progressive exertional dyspnea and chest pain  The patient was in her usual state of health until about the last month when she has been more fatigued/tired than usual.  Then in the last week, she had an episode of chest pain with exertion that resolved with nitro, first time she had had chest pain in months.  Through the week however, she noticed she was progressively more SOB with exertion, and felt like a "brick on my chest".  Saw her PCP earlier in the week, didn't complain of CP to him.  But by today, was feeling SOB and chest tightness with bringing in groceries or walking to bring the trash cans in.  Tonight after dinner she watched TV and when she got up to go to bed around 10PM, she felt SOB and chest tightness with exertion.  She sat down and took a nitro and it eased somewhat but did not stop so she took another nitro.  It did not ease, so she called 9-1-1.  ED course: -Afebrile, heart rate 95 and irregular, RR 28, BP 114/88, pulse ox 96% -Na 138, K 3.2, Cr 1.06 (baseline 1.3), WBC 12.2K, Hgb 10.5 (baseline 12) -Troponin 0.03 -INR 2.6 -BNP 361 -CXR without edema or pneumonia -ECG showed Afib, rate 94, old LBBB -She was discussed with Cardiology who recommended observation on the hospitalist service and Cardiology consultation in AM  She has not had palpitations, heart racing.  She has not had new leg swelling orthopnea, PND.  Her leadless pacer battery died a few months ago and since then, she and Dr. [name] and trying a wait and watch approach and until this week she has been  asymptomatic.  Last LHC 2017, no intervention at that time.        ROS: Review of Systems  Constitutional: Positive for malaise/fatigue.  Respiratory: Positive for shortness of breath.   Cardiovascular: Positive for chest pain.  All other systems reviewed and are negative.         Past Medical History:  Diagnosis Date  . Chronic anticoagulation - coumadin, CHADS2VASC=6 05/17/2015  . Combined systolic and diastolic heart failure, NYHA class 3 ([place])   . Coronary artery disease    a. s/p multiple stents; b. LHC 12/14/13 with sig CAD with TO of OM1, 70-80% occusion OM2, 50-70% mLCx, 50-70% pLAD (recent FFR 0.83), diffusely diseased RCA, widely patent stetns in pLAD and LCx; c. [facility] 12/14/15 w/ 3v CAD, patent LAD & LCx stents, OM2 75% small & unchanged, RCA diff 30-40 w/o obs dz, med Rx  . Diverticulitis   . Granulosa cell carcinoma ([place])    abd; last episode was in 2009  . Hyperlipidemia   . Hypertension   . LBBB (left bundle branch block)       . Myocardial infarction ([place]) 2002  . Obesity   . OSA on CPAP   . Pacemaker - St Jude Leadless PPM   . Peripheral vascular disease ([name])   . Permanent atrial fibrillation ([place]) 2013  . Renal artery stenosis ([place])   .  Tachycardia-bradycardia syndrome ([place])    a. s/p leadless pacemaker ([name]) implanted by Dr [name]  . Varicose veins     Past Surgical History:  Procedure Laterality Date  . ABDOMINAL HYSTERECTOMY    . CARDIAC CATHETERIZATION  09/03/2007   EF 70%; Failed attempt at PCI to OM  . CARDIAC CATHETERIZATION  11/01/2003   EF 70%  . CARDIAC CATHETERIZATION N/A 12/14/2015   Procedure: Left Heart Cath and Coronary Angiography;  Surgeon: Burnell Blanks, MD;  Location: [place] CV LAB;  Service: Cardiovascular;  Laterality: N/A;  . CARDIOVERSION  12/31/2011   Procedure: CARDIOVERSION;  Surgeon: [name], MD;  Location: [facility] ENDOSCOPY;  Service: Cardiovascular;  Laterality: N/A;  . CARDIOVERSION N/A  12/31/2011   Procedure: CARDIOVERSION;  Surgeon: [name], MD;  Location: [facility] CATH LAB;  Service: Cardiovascular;  Laterality: N/A;  . CATARACT EXTRACTION, BILATERAL  2015  . CHOLECYSTECTOMY  1980's  . COLON SURGERY  2004   colectomy for diverticulosis  . CORONARY ANGIOPLASTY WITH STENT PLACEMENT  2000; 08/11/2012; 11/12/2012   3 + 2 LAD & CFX; 2nd CFX stent 11/12/2012  . FRACTIONAL FLOW RESERVE WIRE  10/07/2013   Procedure: FRACTIONAL FLOW RESERVE WIRE;  Surgeon: [name], MD;  Location: [facility] CATH LAB;  Service: Cardiovascular;;  . granulosa tumor excision  2000; 2003; 2004; 2007   "all in my abdomen including small intestines, outside my ?uterus/etc" (08/11/2012)  . HERNIA REPAIR  2005   "laparoscopic"  . LEFT HEART CATHETERIZATION WITH CORONARY ANGIOGRAM N/A 11/12/2012   Procedure: LEFT HEART CATHETERIZATION WITH CORONARY ANGIOGRAM;  Surgeon: [name], MD;  Location: [facility] CATH LAB;  Service: Cardiovascular;  Laterality: N/A;  . LEFT HEART CATHETERIZATION WITH CORONARY ANGIOGRAM N/A 10/07/2013   Procedure: LEFT HEART CATHETERIZATION WITH CORONARY ANGIOGRAM;  Surgeon: [name], MD;  Location: [facility] CATH LAB;  Service: Cardiovascular;  Laterality: N/A;  . LEFT HEART CATHETERIZATION WITH CORONARY ANGIOGRAM N/A 12/14/2013   Procedure: LEFT HEART CATHETERIZATION WITH CORONARY ANGIOGRAM;  Surgeon: Sinclair Grooms, MD;  Location: [facility] CATH LAB;  Service: Cardiovascular;  Laterality: N/A;  . LEFT HEART CATHETERIZATION WITH CORONARY ANGIOGRAM N/A 05/16/2014   Procedure: LEFT HEART CATHETERIZATION WITH CORONARY ANGIOGRAM;  Surgeon: Sherren Mocha, MD;  Location: [facility] CATH LAB;  Service: Cardiovascular;  Laterality: N/A;  . PERCUTANEOUS CORONARY INTERVENTION-BALLOON ONLY  08/04/2012   Procedure: PERCUTANEOUS CORONARY INTERVENTION-BALLOON ONLY;  Surgeon: [name], MD;  Location: [facility] CATH LAB;  Service: Cardiovascular;;  . PERCUTANEOUS CORONARY ROTOBLATOR INTERVENTION  (PCI-R) N/A 08/11/2012   Procedure: PERCUTANEOUS CORONARY ROTOBLATOR INTERVENTION (PCI-R);  Surgeon: [name], MD;  Location: [facility] CATH LAB;  Service: Cardiovascular;  Laterality: N/A;  . PERCUTANEOUS CORONARY STENT INTERVENTION (PCI-S)  10/07/2013   Procedure: PERCUTANEOUS CORONARY STENT INTERVENTION (PCI-S);  Surgeon: [name], MD;  Location: [facility] CATH LAB;  Service: Cardiovascular;;  . PERMANENT PACEMAKER INSERTION N/A 03/16/2012   [name] (SJM) leadless pacemaker (LEADLESS II STUDY PATEINT)  . SALIVARY GLAND SURGERY  2000's   "had a little lump removed; granulosa related; it was benign" (08/11/2012)  . TEE WITHOUT CARDIOVERSION  12/31/2011   Procedure: TRANSESOPHAGEAL ECHOCARDIOGRAM (TEE);  Surgeon: [name], MD;  Location: [place];  Service: Cardiovascular;  Laterality: N/A;  . VARICOSE VEIN SURGERY Bilateral 1977    Social History: Patient lives alone.  Husband died 1 year ago.  The patient walks unassisted.  Still drives and manages all IADLs.  Nonsmoker.  From [place].  Allergies  Allergen Reactions  . Bee Venom Anaphylaxis  . Other Nausea And Vomiting and Other (See Comments)    Pain medications cause severe vomiting. Tolerated slow IV morphine drip    Family history: family history includes Diabetes in her brother and father; Heart attack in her brother and father; Hypertension in her brother and father; Kidney failure in her brother; Stroke in her mother.  Prior to Admission medications   Medication Sig Start Date End Date Taking? Authorizing Provider  atorvastatin (LIPITOR) 80 MG tablet TAKE 1 TABLET BY MOUTH EVERY MORNING 06/03/16  Yes [name], MD  beta carotene w/minerals (OCUVITE) tablet Take 1 tablet by mouth daily.    Yes [provider]  Calcium Carb-Cholecalciferol (CALTRATE 600+D) 600-800 MG-UNIT TABS Take 1 tablet by mouth every evening.    Yes [provider]  clopidogrel (PLAVIX) 75 MG tablet TAKE 1 TABLET  BY MOUTH EVERY DAY WITH BREAKFAST 05/12/16  Yes [name], MD  Coenzyme Q-10 100 MG capsule Take 200 mg by mouth daily.    Yes [provider]  diltiazem (CARDIZEM CD) 120 MG 24 hr capsule Take 1 capsule (120 mg total) by mouth at bedtime. 01/18/16  Yes Sherran Needs, NP  diltiazem (CARDIZEM CD) 180 MG 24 hr capsule TAKE ONE CAPSULE BY MOUTH DAILY AFTER BREAKFAST 07/07/16  Yes Sherran Needs, NP  furosemide (LASIX) 40 MG tablet Take 80 mg by mouth daily.   Yes [provider]  isosorbide mononitrate (IMDUR) 60 MG 24 hr tablet TAKE 1 TABLET (60 MG TOTAL) BY MOUTH DAILY. 06/03/16  Yes Seiler, Amber K, NP  KLOR-CON M20 20 MEQ tablet TAKE 1 TABLET (20 MEQ TOTAL) BY MOUTH DAILY. 11/29/15  Yes [name], MD  metoprolol (LOPRESSOR) 100 MG tablet TAKE 1 TABLET (100 MG TOTAL) BY MOUTH 2 (TWO) TIMES DAILY. 11/29/15  Yes Allred, Jeneen Rinks, MD  Multiple Vitamin (MULTIVITAMIN) capsule Take 1 capsule by mouth daily.   Yes [provider]  nitroGLYCERIN (NITROSTAT) 0.4 MG SL tablet Place 0.4 mg under the tongue every 5 (five) minutes as needed for chest pain. 3 dose max   Yes [provider]  Omega-3 Fatty Acids (FISH OIL PO) Take 1,200 mg by mouth daily.    Yes [provider]  spironolactone (ALDACTONE) 25 MG tablet Take 25 mg by mouth daily as needed (fluid).  05/21/15  Yes [name], MD  warfarin (COUMADIN) 2 MG tablet TAKE AS DIRECTED BY COUMADIN CLINIC Patient taking differently: take 1 mg on Monday and Friday all other days take 2 mg. 05/06/16  Yes [name], MD       Physical Exam: BP (!) 140/95   Pulse (!) 40   Temp 98.9 F (37.2 C)   Resp (!) 26   Ht 5' (1.524 m)   Wt 77.6 kg (171 lb)   SpO2 97%   BMI 33.40 kg/m  General appearance: Overweight elderly adult female, alert and in no acute distress.   Eyes: Anicteric, conjunctiva pink, lids and lashes normal. PERRL.    ENT: No nasal deformity, discharge,  epistaxis.  Hearing normal. OP moist without lesions.   Neck: No neck masses.  Trachea midline.  No thyromegaly/tenderness. Lymph: No cervical or supraclavicular lymphadenopathy. Skin: Warm and dry.  No jaundice.  No suspicious rashes or lesions.  Lip lesion. Cardiac:Tachycardic, irregular, nl S1-S2, no murmurs appreciated.  Capillary refill is brisk.  JVP not visible.  No LE edema.  Radial and DP pulses  2+ and symmetric. Respiratory: Normal respiratory rate and rhythm.  CTAB without rales or wheezes. Abdomen: Abdomen soft.  No TTP, no rebound. No ascites, distension, hepatosplenomegaly.   MSK: No deformities or effusions.  No cyanosis or clubbing. Neuro: Cranial nerves 3-12 intact.  Sensation intact to light touch. Speech is fluent.  Muscle strength symmetric.    Psych: Sensorium intact and responding to questions, attention normal.  Behavior appropriate.  Affect normal.  Judgment and insight appear normal.     Labs on Admission:  I have personally reviewed following labs and imaging studies: CBC:  Recent Labs Lab 07/10/16 2340  WBC 12.2*  HGB 10.5*  HCT 32.9*  MCV 96.8  PLT 992   Basic Metabolic Panel:  Recent Labs Lab 07/10/16 2340  NA 138  K 3.2*  CL 100*  CO2 27  GLUCOSE 129*  BUN 17  CREATININE 1.06*  CALCIUM 8.1*   GFR: Estimated Creatinine Clearance: 37 mL/min (A) (by C-G formula based on SCr of 1.06 mg/dL (H)).  Liver Function Tests: No results for input(s): AST, ALT, ALKPHOS, BILITOT, PROT, ALBUMIN in the last 168 hours. No results for input(s): LIPASE, AMYLASE in the last 168 hours. No results for input(s): AMMONIA in the last 168 hours. Coagulation Profile:  Recent Labs Lab 07/10/16 2340  INR 2.63   Cardiac Enzymes:  Recent Labs Lab 07/10/16 2340  TROPONINI 0.03*   BNP (last 3 results) No results for input(s): PROBNP in the last 8760 hours. HbA1C: No results for input(s): HGBA1C in the last 72 hours. CBG: No results for input(s): GLUCAP in  the last 168 hours. Lipid Profile: No results for input(s): CHOL, HDL, LDLCALC, TRIG, CHOLHDL, LDLDIRECT in the last 72 hours. Thyroid Function Tests: No results for input(s): TSH, T4TOTAL, FREET4, T3FREE, THYROIDAB in the last 72 hours. Anemia Panel: No results for input(s): VITAMINB12, FOLATE, FERRITIN, TIBC, IRON, RETICCTPCT in the last 72 hours. Sepsis Labs:  Invalid input(s): PROCALCITONIN, LACTICIDVEN No results found for this or any previous visit (from the past 240 hour(s)).       Radiological Exams on Admission: Personally reviewed CXR shows no edema or pneumonia: Dg Chest 2 View  Result Date: 07/10/2016 CLINICAL DATA:  Substernal chest pain. EXAM: CHEST  2 VIEW COMPARISON:  12/13/2015 FINDINGS: Stable cardiomegaly and atherosclerosis of the thoracic aorta. Implanted loop recorder projects over the left chest. No evidence of pulmonary edema, pleural effusion, focal airspace disease or pneumothorax. Stable osseous structures. IMPRESSION: Stable cardiomegaly and atherosclerosis of the thoracic aorta. No evidence of congestive failure or acute abnormality. Electronically Signed   By: Jeb Levering M.D.   On: 07/10/2016 23:18    EKG: Independently reviewed. Rate 94, Afib/flutter, LBBB old.  Echocardiogram 2016: Report reviewed EF 45% No sig valve disease  LHC Nov 2017: 1. Triple vessel CAD 2. Patent stents in the proximal and mid LAD with minimal restenosis. The mid and distal LAD has no obstructive disease.  3. The Circumflex has a patent proximal stent with minimal restenosis. The first OM branch is occluded and fills from left to left collaterals. The second OM branch is a small caliber vessel with ostial 75% stenosis, unchanged from last cath and too small for PCI.  4. The RCA is a large dominant vessel with diffuse 30-40% calcific stenosis but no obstructive disease.        Assessment/Plan  1. Dyspnea and new chest pain, suspect unstable angina:  Patient states  this chest pain feels identical to her previous angina, just  not relieved with nitroglycerin.  Other possible explanations for her new symptoms this week include A fib with RVR (although she is taking her dilt/metop) or CHF (although doesn't appear fluid overloaded to me on exam).  Therapeutic INR, doubt PE.  Minimal troponin elevation is nondiagnostic. -Trend troponins -Aspirin 324 given -Start heparin when INR < 1.7 -Consult to Cardiology, appreciate cares -Monitor on telemetry -Obtain echocardiogram -Continue home Imdur, lipitor, BB, and Plavix   2. Atrial fibrillation:  CHADS2Vasc 6. -Hold warfarin, check INR daily; restart if Cardiology decides not to pursue intervention -Continue diltiazem, BB  3. Hypokalemia:  Goal K>4, mag>2 -Replete K -Check Mag  4. Anemia, normocytic:  New.  Not a degree of anemia that I suspect is symptomatic at all. -Check iron stores, B12, reticulocytes  5. Chronic systolic CHF:  BNP up but no edema on CXR and no significant fluid overload on exam. -Continue furosemide -Takes spironolactone PRN  6. New lip squamous cancer:  -Plans for Mohs biopsy in a few weeks.  7. Ovarian Ca:  Rare indolent form.  Never needed chemo. Has plans for outpatient GYN onc f/u in future.     DVT prophylaxis: N/A  Code Status: DO NOT RESUSCITATE  Family Communication: None present  Disposition Plan: Anticipate trend troponin overnight and consult to Cardiology Consults called: Cardiology via Inbasket Admission status: OBS At the point of initial evaluation, it is my clinical opinion that admission for OBSERVATION is reasonable and necessary because the patient's presenting complaints in the context of their chronic conditions represent sufficient risk of deterioration or significant morbidity to constitute reasonable grounds for close observation in the hospital setting, but that the patient may be medically stable for discharge from the hospital within 24 to 48  hours.    Medical decision making: Patient seen at 2:10 AM on 07/11/2016.  The patient was discussed with Dr. Leonides Schanz.  What exists of the patient's chart was reviewed in depth and summarized above.  Clinical condition: stable.        Edwin Dada [facility] Pager [phone]

## 2016-07-11 NOTE — ED Notes (Signed)
EKG done at 01:10 but will not transfer in system. IT has been called by charge RN to report the same.

## 2016-07-12 DIAGNOSIS — I214 Non-ST elevation (NSTEMI) myocardial infarction: Principal | ICD-10-CM

## 2016-07-12 LAB — COMPREHENSIVE METABOLIC PANEL
ALT: 29 U/L (ref 14–54)
AST: 40 U/L (ref 15–41)
Albumin: 2.5 g/dL — ABNORMAL LOW (ref 3.5–5.0)
Alkaline Phosphatase: 100 U/L (ref 38–126)
Anion gap: 8 (ref 5–15)
BUN: 17 mg/dL (ref 6–20)
CHLORIDE: 102 mmol/L (ref 101–111)
CO2: 28 mmol/L (ref 22–32)
Calcium: 8 mg/dL — ABNORMAL LOW (ref 8.9–10.3)
Creatinine, Ser: 1.07 mg/dL — ABNORMAL HIGH (ref 0.44–1.00)
GFR, EST AFRICAN AMERICAN: 54 mL/min — AB (ref >60)
GFR, EST NON AFRICAN AMERICAN: 47 mL/min — AB (ref >60)
Glucose, Bld: 124 mg/dL — ABNORMAL HIGH (ref 65–99)
POTASSIUM: 3.9 mmol/L (ref 3.5–5.1)
Sodium: 138 mmol/L (ref 135–145)
Total Bilirubin: 1.3 mg/dL — ABNORMAL HIGH (ref 0.3–1.2)
Total Protein: 5.7 g/dL — ABNORMAL LOW (ref 6.5–8.1)

## 2016-07-12 LAB — CBC
HCT: 32.2 % — ABNORMAL LOW (ref 36.0–46.0)
Hemoglobin: 10.4 g/dL — ABNORMAL LOW (ref 12.0–15.0)
MCH: 31.2 pg (ref 26.0–34.0)
MCHC: 32.3 g/dL (ref 30.0–36.0)
MCV: 96.7 fL (ref 78.0–100.0)
PLATELETS: 314 10*3/uL (ref 150–400)
RBC: 3.33 MIL/uL — ABNORMAL LOW (ref 3.87–5.11)
RDW: 14.9 % (ref 11.5–15.5)
WBC: 15.7 10*3/uL — AB (ref 4.0–10.5)

## 2016-07-12 LAB — MAGNESIUM: MAGNESIUM: 1.9 mg/dL (ref 1.7–2.4)

## 2016-07-12 LAB — PROTIME-INR
INR: 2.15
Prothrombin Time: 24.4 seconds — ABNORMAL HIGH (ref 11.4–15.2)

## 2016-07-12 MED ORDER — DILTIAZEM HCL ER COATED BEADS 240 MG PO CP24
240.0000 mg | ORAL_CAPSULE | Freq: Every day | ORAL | Status: DC
  Administered 2016-07-12 – 2016-07-16 (×5): 240 mg via ORAL
  Filled 2016-07-12 (×6): qty 1

## 2016-07-12 MED ORDER — DEXTROSE 5 % IV SOLN
1.0000 g | INTRAVENOUS | Status: AC
  Administered 2016-07-12 – 2016-07-14 (×3): 1 g via INTRAVENOUS
  Filled 2016-07-12 (×3): qty 10

## 2016-07-12 NOTE — Progress Notes (Signed)
ANTICOAGULATION CONSULT NOTE - Follow Up Consult  Pharmacy Consult for Heparin Indication: atrial fibrillation  Allergies  Allergen Reactions  . Bee Venom Anaphylaxis  . Other Nausea And Vomiting and Other (See Comments)    Pain medications cause severe vomiting. Tolerated slow IV morphine drip    Patient Measurements: Height: 5' (152.4 cm) Weight: 167 lb 15.9 oz (76.2 kg) IBW/kg (Calculated) : 45.5 Heparin Dosing Weight:   Vital Signs: Temp: 98.9 F (37.2 C) (06/09 1050) Temp Source: Oral (06/09 1050) BP: 126/70 (06/09 0800) Pulse Rate: 72 (06/09 1050)  Labs:  Recent Labs  07/10/16 2340 07/11/16 0414 07/11/16 0926 07/11/16 1147 07/12/16 0236  HGB 10.5*  --   --   --  10.4*  HCT 32.9*  --   --   --  32.2*  PLT 367  --   --   --  314  LABPROT 28.6*  --   --  26.9* 24.4*  INR 2.63  --   --  2.44 2.15  CREATININE 1.06*  --  0.98  --  1.07*  TROPONINI 0.03* 0.04* 0.06*  --   --     Estimated Creatinine Clearance: 36.4 mL/min (A) (by C-G formula based on SCr of 1.07 mg/dL (H)).  Assessment: 41 yoF on warfarin PTA for AFib to begin on heparin gtt once INR < 2.0 with need for cath during admit. INR 2.15 today.  Goal of Therapy:  Heparin level 0.3-0.7 units/ml Monitor platelets by anticoagulation protocol: Yes   Plan:  -No heparin today -Follow-up 0500 INR - initiate heparin once < 2.0  Arrie Senate, PharmD PGY-1 Pharmacy Resident Pager: [phone] 07/12/2016

## 2016-07-12 NOTE — Progress Notes (Signed)
Progress Note  Patient Name: Patty [name] Date of Encounter: 07/12/2016  Primary Cardiologist:  Dr. Irish Lack  Subjective   Denies any chest pain or SOB today.   Inpatient Medications    Scheduled Meds: . atorvastatin  80 mg Oral q morning - 10a  . clopidogrel  75 mg Oral Daily  . diltiazem  120 mg Oral QHS  . diltiazem  180 mg Oral Daily  . furosemide  80 mg Oral Daily  . isosorbide mononitrate  60 mg Oral Daily  . metoprolol tartrate  100 mg Oral BID   Continuous Infusions:  PRN Meds: acetaminophen, metoprolol tartrate, nitroGLYCERIN, ondansetron (ZOFRAN) IV   Vital Signs    Vitals:   07/11/16 2335 07/12/16 0000 07/12/16 0345 07/12/16 0400  BP:   (!) 122/48 (!) 119/46  Pulse:  77 95 (!) 47  Resp:  (!) 28 (!) 26 18  Temp: 98.2 F (36.8 C)  99.1 F (37.3 C)   TempSrc: Axillary  Oral   SpO2:  95%  93%  Weight:      Height:        Intake/Output Summary (Last 24 hours) at 07/12/16 0814 Last data filed at 07/12/16 0415  Gross per 24 hour  Intake              360 ml  Output             1325 ml  Net             -965 ml   Filed Weights   07/10/16 2239 07/11/16 0334  Weight: 171 lb (77.6 kg) 167 lb 15.9 oz (76.2 kg)    Telemetry    Atrial flutter with rate 100-116 - Personally Reviewed  ECG    Atrial flutter with LBBB - Personally Reviewed  Physical Exam   GEN: No acute distress.   Neck: No JVD Cardiac: IRRR, tachy, no murmurs, rubs, or gallops.  Respiratory: Clear to auscultation bilaterally. GI: Soft, nontender, non-distended  MS: No edema; No deformity. Neuro:  Nonfocal  Psych: Normal affect   Labs    Chemistry Recent Labs Lab 07/10/16 2340 07/11/16 0926 07/12/16 0236  NA 138 139 138  K 3.2* 3.1* 3.9  CL 100* 102 102  CO2 27 29 28   GLUCOSE 129* 135* 124*  BUN 17 14 17   CREATININE 1.06* 0.98 1.07*  CALCIUM 8.1* 8.1* 8.0*  PROT  --   --  5.7*  ALBUMIN  --   --  2.5*  AST  --   --  40  ALT  --   --  29  ALKPHOS  --   --  100    BILITOT  --   --  1.3*  GFRNONAA 47* 52* 47*  GFRAA 55* >60 54*  ANIONGAP 11 8 8      Hematology Recent Labs Lab 07/10/16 2340 07/11/16 0414 07/12/16 0236  WBC 12.2*  --  15.7*  RBC 3.40* 3.30* 3.33*  HGB 10.5*  --  10.4*  HCT 32.9*  --  32.2*  MCV 96.8  --  96.7  MCH 30.9  --  31.2  MCHC 31.9  --  32.3  RDW 14.9  --  14.9  PLT 367  --  314    Cardiac Enzymes Recent Labs Lab 07/10/16 2340 07/11/16 0414 07/11/16 0926  TROPONINI 0.03* 0.04* 0.06*    Recent Labs Lab 07/11/16 0005  TROPIPOC 0.02     BNP Recent Labs Lab 07/10/16 2340  BNP 361.3*  DDimer No results for input(s): DDIMER in the last 168 hours.   Radiology    Dg Chest 2 View  Result Date: 07/10/2016 CLINICAL DATA:  Substernal chest pain. EXAM: CHEST  2 VIEW COMPARISON:  12/13/2015 FINDINGS: Stable cardiomegaly and atherosclerosis of the thoracic aorta. Implanted loop recorder projects over the left chest. No evidence of pulmonary edema, pleural effusion, focal airspace disease or pneumothorax. Stable osseous structures. IMPRESSION: Stable cardiomegaly and atherosclerosis of the thoracic aorta. No evidence of congestive failure or acute abnormality. Electronically Signed   By: Jeb Levering M.D.   On: 07/10/2016 23:18    Cardiac Studies   Echo 07/11/16: Study Conclusions  - Left ventricle: There appears to be akinesis of the mid   anteroseptal and apical anterior walls but likely due to LBBB.   The cavity size was normal. There was moderate focal basal and   mild concentric hypertrophy. Systolic function was normal. The   estimated ejection fraction was in the range of 50% to 55%. Wall   motion was normal; there were no regional wall motion   abnormalities. - Ventricular septum: Septal motion showed severe dyssynergy. These   changes are consistent with a left bundle branch block. - Aortic valve: Valve mobility was restricted. There was mild   regurgitation. Valve area (VTI): 1.9 cm^2.  Valve area (Vmax):   1.85 cm^2. Valve area (Vmean): 1.78 cm^2. - Left atrium: The atrium was massively dilated. - Tricuspid valve: There was trivial regurgitation. - Pulmonic valve: There was mild regurgitation.   Patient Profile     83 y.o. female with complicated cardiac history including CAD with multiple stents, chronic combined systolic and diastolic heart failure, peripheral vascular disease, tachybradycardia syndrome, permanent atrial fibrillation, obstructive sleep apnea, who presented with recurring episodes of chest discomfort similar to prior episodes of unstable angina. In reviewing coronary anatomy she has ostial LAD disease with prior stenting.  Assessment & Plan    1. NSTEMI. Peak troponin 0.06. Ecg shows chronic LBBB. Overall LV function good by Echo with anteroseptal WMA and dyssynergy. History of complex CAD with multiple prior interventions. Plan cardiac cath once INR down. Coumadin on hold. IV heparin per pharmacy.  2. Atrial fib/flutter. Chronic. Rate control is not ideal. Will increase am Cardizem CD to 240 mg daily.  3. Hypokalemia- replaced 4. Chronic diastolic and systolic CHF.  5. Tachy-brady syndrome s/p leadless PPM. Battery for pacemaker is depleted. Currently just following. No brady or pauses noted.   Signed, Gaurav Baldree [place], MD  07/12/2016, 8:14 AM

## 2016-07-12 NOTE — Progress Notes (Addendum)
PROGRESS NOTE    Patty [name]  VOH:[id number]  DOB: 1930-04-14  DOA: 07/10/2016 PCP: [name], MD   Brief Admission Hx: Patty [name] is a 83 y.o. female with a past medical history significant for CAD s/p PCI, Afib on warfarin, CHF EF 45%, tachy-brady s/p PPM battery now dead, and HTN who presents with 2 weeks progressive exertional dyspnea and chest pain  MDM/Assessment & Plan:   Chest Pain Unstable Angina - Troponin rose to 0.06.  Pt still having intermittent chest pain relieved with nitroglycerin. Cardiology following and planning for cath early next week.  Continue telemetry monitoring.    Afib/flutter - holding warfarin, heparin per pharmacy  Hypokalemia - repleted  Chronic Systolic CHF - stable, compensated.   Squamous cell cancer lip - pt reports she is going to have Mohs biopsy in a few weeks  Ovarian cancer - outpatient follow up with Gyn oncology.  Leukocytosis - no fever, follow CBC, abnormal UA, will treat for UTI  UTI - ceftriaxone IV x 3 days, follow urine culture.   DVT prophylaxis: heparin Code Status: DNR Family Communication: none present Disposition Plan: TBD   Consultants:  cardiology  Subjective: Pt is still having intermittent chest pain relieved by nitroglycerin.   Objective: Vitals:   07/11/16 2335 07/12/16 0000 07/12/16 0345 07/12/16 0400  BP:   (!) 122/48 (!) 119/46  Pulse:  77 95 (!) 47  Resp:  (!) 28 (!) 26 18  Temp: 98.2 F (36.8 C)  99.1 F (37.3 C)   TempSrc: Axillary  Oral   SpO2:  95%  93%  Weight:      Height:        Intake/Output Summary (Last 24 hours) at 07/12/16 0820 Last data filed at 07/12/16 0415  Gross per 24 hour  Intake              360 ml  Output             1325 ml  Net             -965 ml   Filed Weights   07/10/16 2239 07/11/16 0334  Weight: 77.6 kg (171 lb) 76.2 kg (167 lb 15.9 oz)     REVIEW OF SYSTEMS  As per history otherwise all reviewed and reported negative  Exam:  General  exam: awake, alert, NAD.  Respiratory system: Clear. No increased work of breathing. Cardiovascular system: S1 & S2 heard, irregular.  Gastrointestinal system: Abdomen is nondistended, soft and nontender. Normal bowel sounds heard. Central nervous system: Alert and oriented. No focal neurological deficits. Extremities: no Cyanosis.   Data Reviewed: Basic Metabolic Panel:  Recent Labs Lab 07/10/16 2340 07/11/16 0414 07/11/16 0926 07/12/16 0236  NA 138  --  139 138  K 3.2*  --  3.1* 3.9  CL 100*  --  102 102  CO2 27  --  29 28  GLUCOSE 129*  --  135* 124*  BUN 17  --  14 17  CREATININE 1.06*  --  0.98 1.07*  CALCIUM 8.1*  --  8.1* 8.0*  MG  --  1.9  --  1.9   Liver Function Tests:  Recent Labs Lab 07/12/16 0236  AST 40  ALT 29  ALKPHOS 100  BILITOT 1.3*  PROT 5.7*  ALBUMIN 2.5*   No results for input(s): LIPASE, AMYLASE in the last 168 hours. No results for input(s): AMMONIA in the last 168 hours. CBC:  Recent Labs Lab 07/10/16 2340 07/12/16  0236  WBC 12.2* 15.7*  HGB 10.5* 10.4*  HCT 32.9* 32.2*  MCV 96.8 96.7  PLT 367 314   Cardiac Enzymes:  Recent Labs Lab 07/10/16 2340 07/11/16 0414 07/11/16 0926  TROPONINI 0.03* 0.04* 0.06*   CBG (last 3)  No results for input(s): GLUCAP in the last 72 hours. Recent Results (from the past 240 hour(s))  MRSA PCR Screening     Status: None   Collection Time: 07/11/16  5:17 AM  Result Value Ref Range Status   MRSA by PCR NEGATIVE NEGATIVE Final    Comment:        The GeneXpert MRSA Assay (FDA approved for NASAL specimens only), is one component of a comprehensive MRSA colonization surveillance program. It is not intended to diagnose MRSA infection nor to guide or monitor treatment for MRSA infections.      Studies: Dg Chest 2 View  Result Date: 07/10/2016 CLINICAL DATA:  Substernal chest pain. EXAM: CHEST  2 VIEW COMPARISON:  12/13/2015 FINDINGS: Stable cardiomegaly and atherosclerosis of the thoracic  aorta. Implanted loop recorder projects over the left chest. No evidence of pulmonary edema, pleural effusion, focal airspace disease or pneumothorax. Stable osseous structures. IMPRESSION: Stable cardiomegaly and atherosclerosis of the thoracic aorta. No evidence of congestive failure or acute abnormality. Electronically Signed   By: Jeb Levering M.D.   On: 07/10/2016 23:18   Scheduled Meds: . atorvastatin  80 mg Oral q morning - 10a  . clopidogrel  75 mg Oral Daily  . diltiazem  120 mg Oral QHS  . diltiazem  180 mg Oral Daily  . furosemide  80 mg Oral Daily  . isosorbide mononitrate  60 mg Oral Daily  . metoprolol tartrate  100 mg Oral BID   Continuous Infusions:  Principal Problem:   Unstable angina ([name]) Active Problems:   CAD (coronary artery disease)   Essential hypertension   Combined systolic and diastolic heart failure, NYHA class 3 ([name])   Pacemaker - St Jude Leadless PPM   Chest pain   Atrial flutter ([name])   Hypokalemia   OSA on CPAP   NSTEMI (non-ST elevated myocardial infarction) ([place])   Normocytic anemia  Critical Care Time spent: 63 mins  Irwin Brakeman, MD, FAAFP [facility] Pager [phone]  If 7PM-7AM, please contact night-coverage [email or web address] Password [email or web address] 07/12/2016, 8:20 AM    LOS: 1 day

## 2016-07-13 ENCOUNTER — Other Ambulatory Visit: Payer: Self-pay | Admitting: Interventional Cardiology

## 2016-07-13 LAB — CBC WITH DIFFERENTIAL/PLATELET
Basophils Absolute: 0 10*3/uL (ref 0.0–0.1)
Basophils Relative: 0 %
EOS ABS: 0.2 10*3/uL (ref 0.0–0.7)
EOS PCT: 1 %
HCT: 30.6 % — ABNORMAL LOW (ref 36.0–46.0)
Hemoglobin: 9.9 g/dL — ABNORMAL LOW (ref 12.0–15.0)
LYMPHS ABS: 1.2 10*3/uL (ref 0.7–4.0)
Lymphocytes Relative: 9 %
MCH: 31.4 pg (ref 26.0–34.0)
MCHC: 32.4 g/dL (ref 30.0–36.0)
MCV: 97.1 fL (ref 78.0–100.0)
MONO ABS: 1 10*3/uL (ref 0.1–1.0)
Monocytes Relative: 8 %
Neutro Abs: 10.7 10*3/uL — ABNORMAL HIGH (ref 1.7–7.7)
Neutrophils Relative %: 82 %
PLATELETS: 346 10*3/uL (ref 150–400)
RBC: 3.15 MIL/uL — AB (ref 3.87–5.11)
RDW: 15 % (ref 11.5–15.5)
WBC: 13.1 10*3/uL — AB (ref 4.0–10.5)

## 2016-07-13 LAB — BASIC METABOLIC PANEL
Anion gap: 7 (ref 5–15)
BUN: 19 mg/dL (ref 6–20)
CHLORIDE: 100 mmol/L — AB (ref 101–111)
CO2: 30 mmol/L (ref 22–32)
CREATININE: 1.06 mg/dL — AB (ref 0.44–1.00)
Calcium: 8 mg/dL — ABNORMAL LOW (ref 8.9–10.3)
GFR calc Af Amer: 55 mL/min — ABNORMAL LOW (ref >60)
GFR calc non Af Amer: 47 mL/min — ABNORMAL LOW (ref >60)
GLUCOSE: 112 mg/dL — AB (ref 65–99)
Potassium: 3.2 mmol/L — ABNORMAL LOW (ref 3.5–5.1)
Sodium: 137 mmol/L (ref 135–145)

## 2016-07-13 LAB — PROTIME-INR
INR: 1.88
PROTHROMBIN TIME: 21.8 s — AB (ref 11.4–15.2)

## 2016-07-13 LAB — HEPARIN LEVEL (UNFRACTIONATED)
HEPARIN UNFRACTIONATED: 0.32 [IU]/mL (ref 0.30–0.70)
Heparin Unfractionated: 0.17 IU/mL — ABNORMAL LOW (ref 0.30–0.70)

## 2016-07-13 MED ORDER — SODIUM CHLORIDE 0.9 % WEIGHT BASED INFUSION
3.0000 mL/kg/h | INTRAVENOUS | Status: DC
  Administered 2016-07-14: 3 mL/kg/h via INTRAVENOUS

## 2016-07-13 MED ORDER — SODIUM CHLORIDE 0.9 % WEIGHT BASED INFUSION
1.0000 mL/kg/h | INTRAVENOUS | Status: DC
  Administered 2016-07-14: 1 mL/kg/h via INTRAVENOUS

## 2016-07-13 MED ORDER — POTASSIUM CHLORIDE CRYS ER 20 MEQ PO TBCR: 40.0000 meq | EXTENDED_RELEASE_TABLET | Freq: Once | ORAL | Status: DC

## 2016-07-13 MED ORDER — SODIUM CHLORIDE 0.9% FLUSH: 3.0000 mL | INTRAVENOUS | Status: DC | PRN

## 2016-07-13 MED ORDER — ASPIRIN 81 MG PO CHEW
81.0000 mg | CHEWABLE_TABLET | ORAL | Status: AC
  Administered 2016-07-14: 81 mg via ORAL
  Filled 2016-07-13: qty 1

## 2016-07-13 MED ORDER — HEPARIN (PORCINE) IN NACL 100-0.45 UNIT/ML-% IJ SOLN
1050.0000 [IU]/h | INTRAMUSCULAR | Status: DC
  Administered 2016-07-13: 850 [IU]/h via INTRAVENOUS
  Administered 2016-07-14: 1050 [IU]/h via INTRAVENOUS
  Filled 2016-07-13 (×2): qty 250

## 2016-07-13 MED ORDER — POTASSIUM CHLORIDE CRYS ER 20 MEQ PO TBCR
60.0000 meq | EXTENDED_RELEASE_TABLET | Freq: Once | ORAL | Status: AC
  Administered 2016-07-13: 60 meq via ORAL
  Filled 2016-07-13: qty 3

## 2016-07-13 MED ORDER — SODIUM CHLORIDE 0.9% FLUSH
3.0000 mL | Freq: Two times a day (BID) | INTRAVENOUS | Status: DC
  Administered 2016-07-14: 3 mL via INTRAVENOUS

## 2016-07-13 MED ORDER — SODIUM CHLORIDE 0.9 % IV SOLN: 250.0000 mL | INTRAVENOUS | Status: DC | PRN

## 2016-07-13 NOTE — Progress Notes (Signed)
Progress Note  Patient Name: Patty [name] Date of Encounter: 07/13/2016  Primary Cardiologist:  Dr. Irish Lack  Subjective   Denies any chest pain or SOB today.   Aggravated by intermitant iv where heparin Going in   Inpatient Medications    Scheduled Meds: . atorvastatin  80 mg Oral q morning - 10a  . clopidogrel  75 mg Oral Daily  . diltiazem  120 mg Oral QHS  . diltiazem  240 mg Oral Daily  . furosemide  80 mg Oral Daily  . isosorbide mononitrate  60 mg Oral Daily  . metoprolol tartrate  100 mg Oral BID  . potassium chloride  60 mEq Oral Once   Continuous Infusions: . cefTRIAXone (ROCEPHIN)  IV Stopped (07/12/16 0915)  . heparin 850 Units/hr (07/13/16 9702)   PRN Meds: acetaminophen, metoprolol tartrate, nitroGLYCERIN, ondansetron (ZOFRAN) IV   Vital Signs    Vitals:   07/12/16 1050 07/12/16 2116 07/12/16 2119 07/13/16 0529  BP: 122/66 (!) 147/114 (!) 147/114 139/64  Pulse: 72   79  Resp: 20  20 20   Temp: 98.9 F (37.2 C)  98.3 F (36.8 C) 98 F (36.7 C)  TempSrc: Oral  Oral Oral  SpO2: 98%  96%   Weight:      Height:        Intake/Output Summary (Last 24 hours) at 07/13/16 0926 Last data filed at 07/13/16 0836  Gross per 24 hour  Intake              840 ml  Output              650 ml  Net              190 ml   Filed Weights   07/10/16 2239 07/11/16 0334  Weight: 77.6 kg (171 lb) 76.2 kg (167 lb 15.9 oz)    Telemetry    Atrial flutter with rate 100-116 - Personally Reviewed  ECG    Atrial flutter with LBBB 07/13/2016  - Personally Reviewed  Physical Exam   Affect appropriate Healthy:  appears stated age HEENT: normal Neck supple with no adenopathy JVP normal no bruits no thyromegaly Lungs clear with no wheezing and good diaphragmatic motion Heart:  S1/S2 no murmur, no rub, gallop or click PMI normal Abdomen: benighn, BS positve, no tenderness, no AAA no bruit.  No HSM or HJR Distal pulses intact with no bruits No edema Neuro  non-focal Skin warm and dry No muscular weakness   Labs    Chemistry  Recent Labs Lab 07/11/16 0926 07/12/16 0236 07/13/16 0416  NA 139 138 137  K 3.1* 3.9 3.2*  CL 102 102 100*  CO2 29 28 30   GLUCOSE 135* 124* 112*  BUN 14 17 19   CREATININE 0.98 1.07* 1.06*  CALCIUM 8.1* 8.0* 8.0*  PROT  --  5.7*  --   ALBUMIN  --  2.5*  --   AST  --  40  --   ALT  --  29  --   ALKPHOS  --  100  --   BILITOT  --  1.3*  --   GFRNONAA 52* 47* 47*  GFRAA >60 54* 55*  ANIONGAP 8 8 7      Hematology  Recent Labs Lab 07/10/16 2340 07/11/16 0414 07/12/16 0236 07/13/16 0416  WBC 12.2*  --  15.7* 13.1*  RBC 3.40* 3.30* 3.33* 3.15*  HGB 10.5*  --  10.4* 9.9*  HCT 32.9*  --  32.2*  30.6*  MCV 96.8  --  96.7 97.1  MCH 30.9  --  31.2 31.4  MCHC 31.9  --  32.3 32.4  RDW 14.9  --  14.9 15.0  PLT 367  --  314 346    Cardiac Enzymes  Recent Labs Lab 07/10/16 2340 07/11/16 0414 07/11/16 0926  TROPONINI 0.03* 0.04* 0.06*     Recent Labs Lab 07/11/16 0005  TROPIPOC 0.02     BNP  Recent Labs Lab 07/10/16 2340  BNP 361.3*     DDimer No results for input(s): DDIMER in the last 168 hours.   Radiology    No results found.  Cardiac Studies   Echo 07/11/16: Study Conclusions  - Left ventricle: There appears to be akinesis of the mid   anteroseptal and apical anterior walls but likely due to LBBB.   The cavity size was normal. There was moderate focal basal and   mild concentric hypertrophy. Systolic function was normal. The   estimated ejection fraction was in the range of 50% to 55%. Wall   motion was normal; there were no regional wall motion   abnormalities. - Ventricular septum: Septal motion showed severe dyssynergy. These   changes are consistent with a left bundle branch block. - Aortic valve: Valve mobility was restricted. There was mild   regurgitation. Valve area (VTI): 1.9 cm^2. Valve area (Vmax):   1.85 cm^2. Valve area (Vmean): 1.78 cm^2. - Left  atrium: The atrium was massively dilated. - Tricuspid valve: There was trivial regurgitation. - Pulmonic valve: There was mild regurgitation.   Patient Profile     83 y.o. female with complicated cardiac history including CAD with multiple stents, chronic combined systolic and diastolic heart failure, peripheral vascular disease, tachybradycardia syndrome, permanent atrial fibrillation, obstructive sleep apnea, who presented with recurring episodes of chest discomfort similar to prior episodes of unstable angina. In reviewing coronary anatomy she has ostial LAD disease with prior stenting.  Assessment & Plan    1. NSTEMI. Peak troponin 0.06. Ecg shows chronic LBBB. Overall LV function good by Echo with anteroseptal WMA and dyssynergy. History of complex CAD with multiple prior interventions. Plan Cath in am Risks discussed willing to proceed   2. Atrial fib/flutter. Chronic. Rate control better on higher dose cardizem on heparin for cath .  3. Hypokalemia- replaced 4. Chronic diastolic and systolic CHF.  5. Tachy-brady syndrome s/p leadless PPM. Battery for pacemaker is depleted. Currently just following. No brady or pauses noted.   Signed, Jenkins Rouge, MD  07/13/2016, 9:26 AM

## 2016-07-13 NOTE — Progress Notes (Signed)
[place] for Heparin Indication: atrial fibrillation  Allergies  Allergen Reactions  . Bee Venom Anaphylaxis  . Other Nausea And Vomiting and Other (See Comments)    Pain medications cause severe vomiting. Tolerated slow IV morphine drip    Patient Measurements: Height: 5' (152.4 cm) Weight: 167 lb 15.9 oz (76.2 kg) IBW/kg (Calculated) : 45.5 Heparin Dosing Weight: 60 kg  Vital Signs: Temp: 98 F (36.7 C) (06/10 0529) Temp Source: Oral (06/10 0529) BP: 139/64 (06/10 0529) Pulse Rate: 79 (06/10 0529)  Labs:  Recent Labs  07/10/16 2340 07/11/16 0414 07/11/16 0926 07/11/16 1147 07/12/16 0236 07/13/16 0416  HGB 10.5*  --   --   --  10.4* 9.9*  HCT 32.9*  --   --   --  32.2* 30.6*  PLT 367  --   --   --  314 346  LABPROT 28.6*  --   --  26.9* 24.4* 21.8*  INR 2.63  --   --  2.44 2.15 1.88  CREATININE 1.06*  --  0.98  --  1.07* 1.06*  TROPONINI 0.03* 0.04* 0.06*  --   --   --     Estimated Creatinine Clearance: 36.7 mL/min (A) (by C-G formula based on SCr of 1.06 mg/dL (H)).  Assessment: 83 y.o. female with h/o Afib, Coumadin on hold, for heparin.  INR subtherapeutic this morning  Goal of Therapy:  Heparin level 0.3-0.7 units/ml Monitor platelets by anticoagulation protocol: Yes   Plan:  Start heparin 850 units/hr Check heparin level in 8 hours.  Phillis Knack, PharmD, BCPS  07/13/2016

## 2016-07-13 NOTE — Progress Notes (Signed)
[place] for Heparin Indication: atrial fibrillation  Allergies  Allergen Reactions  . Bee Venom Anaphylaxis  . Other Nausea And Vomiting and Other (See Comments)    Pain medications cause severe vomiting. Tolerated slow IV morphine drip    Patient Measurements: Height: 5' (152.4 cm) Weight: 167 lb 15.9 oz (76.2 kg) IBW/kg (Calculated) : 45.5 Heparin Dosing Weight: 60 kg  Vital Signs: Temp: 98.6 F (37 C) (06/10 1300) Temp Source: Oral (06/10 1300) BP: 150/58 (06/10 1006) Pulse Rate: 65 (06/10 1300)  Labs:  Recent Labs  07/10/16 2340 07/11/16 0414 07/11/16 0926 07/11/16 1147 07/12/16 0236 07/13/16 0416 07/13/16 1344  HGB 10.5*  --   --   --  10.4* 9.9*  --   HCT 32.9*  --   --   --  32.2* 30.6*  --   PLT 367  --   --   --  314 346  --   LABPROT 28.6*  --   --  26.9* 24.4* 21.8*  --   INR 2.63  --   --  2.44 2.15 1.88  --   HEPARINUNFRC  --   --   --   --   --   --  0.17*  CREATININE 1.06*  --  0.98  --  1.07* 1.06*  --   TROPONINI 0.03* 0.04* 0.06*  --   --   --   --     Estimated Creatinine Clearance: 36.7 mL/min (A) (by C-G formula based on SCr of 1.06 mg/dL (H)).  Assessment: 83 y.o. female with h/o Afib, Coumadin on hold, for heparin.  INR subtherapeutic (1.88)  this morning. First heparin level is subtherapeutic (0.17) on heparin at 850 units/hr. Heparin level was drawn ~ 7 hours after heparin started. CBC is stable and no signs of bleeding have been noted. Spoke with nurse and no difficulties with line.   Goal of Therapy:  Heparin level 0.3-0.7 units/ml Monitor platelets by anticoagulation protocol: Yes   Plan:  Increase heparin drip to 1050 units/hr  Re-check heparin level in 8 hours. Daily heparin level/CBC Monitor for signs/symptoms of bleeding   Uvaldo Bristle, PharmD PGY1 Pharmacy Resident Pager: [phone]  07/13/2016

## 2016-07-13 NOTE — Plan of Care (Signed)
Problem: Safety: Goal: Ability to remain free from injury will improve Outcome: Progressing  Fall risk bundle in place. No falls or injuries this shift. Will continue to monitor.   Problem: Pain Managment: Goal: General experience of comfort will improve Outcome: Progressing No complaints of pain this shift.   Problem: Tissue Perfusion: Goal: Risk factors for ineffective tissue perfusion will decrease Outcome: Progressing No signs of skin breakdown this shift.

## 2016-07-13 NOTE — Progress Notes (Signed)
PROGRESS NOTE    Patty [name]  PIR:[id number]  DOB: Apr 03, 1930  DOA: 07/10/2016 PCP: [name], MD   Brief Admission Hx: Patty [name] is a 83 y.o. female with a past medical history significant for CAD s/p PCI, Afib on warfarin, CHF EF 45%, tachy-brady s/p PPM battery now dead, and HTN who presents with 2 weeks progressive exertional dyspnea and chest pain  MDM/Assessment & Plan:   Chest Pain Unstable Angina - Troponin peaked at 0.06.  Pt denies recent chest pain. Cardiology following and planning for cath tomorrow.  Continue telemetry monitoring.    Afib/flutter - holding warfarin, heparin per pharmacy  Hypokalemia - given oral replacement, Magnesium ok.    Chronic Systolic CHF - stable, compensated.   Squamous cell cancer lip - pt reports she is going to have Mohs biopsy in a few weeks  Ovarian cancer - outpatient follow up with Gyn oncology.  Leukocytosis - no fever, follow CBC, abnormal UA, will treat for UTI  UTI - ceftriaxone IV x 3 days, follow urine culture.   DVT prophylaxis: heparin Code Status: DNR Family Communication: none present Disposition Plan: TBD   Consultants:  cardiology  Subjective: Pt says she has not had any recent chest pain like she had 2 nights ago.   Objective: Vitals:   07/12/16 1050 07/12/16 2116 07/12/16 2119 07/13/16 0529  BP: 122/66 (!) 147/114 (!) 147/114 139/64  Pulse: 72   79  Resp: 20  20 20   Temp: 98.9 F (37.2 C)  98.3 F (36.8 C) 98 F (36.7 C)  TempSrc: Oral  Oral Oral  SpO2: 98%  96%   Weight:      Height:        Intake/Output Summary (Last 24 hours) at 07/13/16 0858 Last data filed at 07/13/16 0836  Gross per 24 hour  Intake              840 ml  Output              650 ml  Net              190 ml   Filed Weights   07/10/16 2239 07/11/16 0334  Weight: 77.6 kg (171 lb) 76.2 kg (167 lb 15.9 oz)     REVIEW OF SYSTEMS  As per history otherwise all reviewed and reported  negative  Exam:  General exam: awake, alert, NAD.  Respiratory system: Clear. No increased work of breathing. Cardiovascular system: S1 & S2 heard, irregular.  Gastrointestinal system: Abdomen is nondistended, soft and nontender. Normal bowel sounds heard. Central nervous system: Alert and oriented. No focal neurological deficits. Skin: squamous cell lower lip lesion  Extremities: no Cyanosis.   Data Reviewed: Basic Metabolic Panel:  Recent Labs Lab 07/10/16 2340 07/11/16 0414 07/11/16 0926 07/12/16 0236 07/13/16 0416  NA 138  --  139 138 137  K 3.2*  --  3.1* 3.9 3.2*  CL 100*  --  102 102 100*  CO2 27  --  29 28 30   GLUCOSE 129*  --  135* 124* 112*  BUN 17  --  14 17 19   CREATININE 1.06*  --  0.98 1.07* 1.06*  CALCIUM 8.1*  --  8.1* 8.0* 8.0*  MG  --  1.9  --  1.9  --    Liver Function Tests:  Recent Labs Lab 07/12/16 0236  AST 40  ALT 29  ALKPHOS 100  BILITOT 1.3*  PROT 5.7*  ALBUMIN 2.5*   No  results for input(s): LIPASE, AMYLASE in the last 168 hours. No results for input(s): AMMONIA in the last 168 hours. CBC:  Recent Labs Lab 07/10/16 2340 07/12/16 0236 07/13/16 0416  WBC 12.2* 15.7* 13.1*  NEUTROABS  --   --  10.7*  HGB 10.5* 10.4* 9.9*  HCT 32.9* 32.2* 30.6*  MCV 96.8 96.7 97.1  PLT 367 314 346   Cardiac Enzymes:  Recent Labs Lab 07/10/16 2340 07/11/16 0414 07/11/16 0926  TROPONINI 0.03* 0.04* 0.06*   CBG (last 3)  No results for input(s): GLUCAP in the last 72 hours. Recent Results (from the past 240 hour(s))  MRSA PCR Screening     Status: None   Collection Time: 07/11/16  5:17 AM  Result Value Ref Range Status   MRSA by PCR NEGATIVE NEGATIVE Final    Comment:        The GeneXpert MRSA Assay (FDA approved for NASAL specimens only), is one component of a comprehensive MRSA colonization surveillance program. It is not intended to diagnose MRSA infection nor to guide or monitor treatment for MRSA infections.       Studies: No results found. Scheduled Meds: . atorvastatin  80 mg Oral q morning - 10a  . clopidogrel  75 mg Oral Daily  . diltiazem  120 mg Oral QHS  . diltiazem  240 mg Oral Daily  . furosemide  80 mg Oral Daily  . isosorbide mononitrate  60 mg Oral Daily  . metoprolol tartrate  100 mg Oral BID  . potassium chloride  60 mEq Oral Once   Continuous Infusions: . cefTRIAXone (ROCEPHIN)  IV Stopped (07/12/16 0915)  . heparin 850 Units/hr (07/13/16 7322)   Principal Problem:   Unstable angina ([name]) Active Problems:   CAD (coronary artery disease)   Essential hypertension   Combined systolic and diastolic heart failure, NYHA class 3 ([name])   Pacemaker - St Jude Leadless PPM   Chest pain   Atrial flutter ([name])   Hypokalemia   OSA on CPAP   NSTEMI (non-ST elevated myocardial infarction) ([place])   Normocytic anemia  Time spent:  Irwin Brakeman, MD, FAAFP [facility] Pager [phone]  If 7PM-7AM, please contact night-coverage [email or web address] Password [email or web address] 07/13/2016, 8:58 AM    LOS: 2 days

## 2016-07-14 ENCOUNTER — Encounter (HOSPITAL_COMMUNITY)
Admission: EM | Disposition: A | Discharge status: Home / Self Care | Payer: Self-pay | Source: Home / Self Care | Attending: Family Medicine

## 2016-07-14 DIAGNOSIS — I495 Sick sinus syndrome: Secondary | ICD-10-CM

## 2016-07-14 DIAGNOSIS — I4891 Unspecified atrial fibrillation: Secondary | ICD-10-CM

## 2016-07-14 HISTORY — PX: CORONARY BALLOON ANGIOPLASTY: CATH118233

## 2016-07-14 HISTORY — PX: LEFT HEART CATH AND CORONARY ANGIOGRAPHY: CATH118249

## 2016-07-14 LAB — PROTIME-INR
INR: 1.75
Prothrombin Time: 20.6 seconds — ABNORMAL HIGH (ref 11.4–15.2)

## 2016-07-14 LAB — CBC
HCT: 30.5 % — ABNORMAL LOW (ref 36.0–46.0)
Hemoglobin: 9.7 g/dL — ABNORMAL LOW (ref 12.0–15.0)
MCH: 30.8 pg (ref 26.0–34.0)
MCHC: 31.8 g/dL (ref 30.0–36.0)
MCV: 96.8 fL (ref 78.0–100.0)
PLATELETS: 387 10*3/uL (ref 150–400)
RBC: 3.15 MIL/uL — AB (ref 3.87–5.11)
RDW: 15.1 % (ref 11.5–15.5)
WBC: 9.8 10*3/uL (ref 4.0–10.5)

## 2016-07-14 LAB — BASIC METABOLIC PANEL
Anion gap: 7 (ref 5–15)
BUN: 17 mg/dL (ref 6–20)
CHLORIDE: 104 mmol/L (ref 101–111)
CO2: 28 mmol/L (ref 22–32)
CREATININE: 1.02 mg/dL — AB (ref 0.44–1.00)
Calcium: 8 mg/dL — ABNORMAL LOW (ref 8.9–10.3)
GFR calc Af Amer: 57 mL/min — ABNORMAL LOW (ref >60)
GFR, EST NON AFRICAN AMERICAN: 49 mL/min — AB (ref >60)
Glucose, Bld: 109 mg/dL — ABNORMAL HIGH (ref 65–99)
Potassium: 3.9 mmol/L (ref 3.5–5.1)
SODIUM: 139 mmol/L (ref 135–145)

## 2016-07-14 LAB — POCT ACTIVATED CLOTTING TIME: Activated Clotting Time: 252 seconds

## 2016-07-14 LAB — HEPARIN LEVEL (UNFRACTIONATED): Heparin Unfractionated: 0.37 IU/mL (ref 0.30–0.70)

## 2016-07-14 SURGERY — LEFT HEART CATH AND CORONARY ANGIOGRAPHY
Anesthesia: LOCAL

## 2016-07-14 MED ORDER — IOPAMIDOL (ISOVUE-370) INJECTION 76%
INTRAVENOUS | Status: DC | PRN
  Administered 2016-07-14: 100 mL via INTRA_ARTERIAL

## 2016-07-14 MED ORDER — HEPARIN SODIUM (PORCINE) 1000 UNIT/ML IJ SOLN
INTRAMUSCULAR | Status: DC | PRN
  Administered 2016-07-14: 3000 [IU] via INTRAVENOUS
  Administered 2016-07-14: 4000 [IU] via INTRAVENOUS
  Administered 2016-07-14: 2000 [IU] via INTRAVENOUS

## 2016-07-14 MED ORDER — SODIUM CHLORIDE 0.9% FLUSH
3.0000 mL | INTRAVENOUS | Status: DC | PRN
  Administered 2016-07-15: 03:00:00 3 mL via INTRAVENOUS
  Filled 2016-07-14: qty 3

## 2016-07-14 MED ORDER — ANGIOPLASTY BOOK
Freq: Once | Status: AC
  Administered 2016-07-14: 20:00:00
  Filled 2016-07-14: qty 1

## 2016-07-14 MED ORDER — HYDRALAZINE HCL 20 MG/ML IJ SOLN: 5.0000 mg | INTRAMUSCULAR | Status: DC | PRN

## 2016-07-14 MED ORDER — VERAPAMIL HCL 2.5 MG/ML IV SOLN
INTRAVENOUS | Status: AC
  Filled 2016-07-14: qty 2

## 2016-07-14 MED ORDER — HEPARIN (PORCINE) IN NACL 2-0.9 UNIT/ML-% IJ SOLN
INTRAMUSCULAR | Status: AC | PRN
  Administered 2016-07-14: 1000 mL

## 2016-07-14 MED ORDER — SODIUM CHLORIDE 0.9 % IV SOLN: 250.0000 mL | INTRAVENOUS | Status: DC | PRN

## 2016-07-14 MED ORDER — VERAPAMIL HCL 2.5 MG/ML IV SOLN
INTRAVENOUS | Status: DC | PRN
  Administered 2016-07-14: 10 mL via INTRA_ARTERIAL

## 2016-07-14 MED ORDER — MIDAZOLAM HCL 2 MG/2ML IJ SOLN
INTRAMUSCULAR | Status: AC
  Filled 2016-07-14: qty 2

## 2016-07-14 MED ORDER — MIDAZOLAM HCL 2 MG/2ML IJ SOLN
INTRAMUSCULAR | Status: DC | PRN
  Administered 2016-07-14: 1 mg via INTRAVENOUS

## 2016-07-14 MED ORDER — LIDOCAINE HCL (PF) 1 % IJ SOLN
INTRAMUSCULAR | Status: AC
  Filled 2016-07-14: qty 30

## 2016-07-14 MED ORDER — LABETALOL HCL 5 MG/ML IV SOLN
10.0000 mg | INTRAVENOUS | Status: DC | PRN
  Administered 2016-07-14: 10 mg via INTRAVENOUS
  Filled 2016-07-14: qty 4

## 2016-07-14 MED ORDER — SODIUM CHLORIDE 0.9% FLUSH
3.0000 mL | Freq: Two times a day (BID) | INTRAVENOUS | Status: DC
  Administered 2016-07-14 – 2016-07-16 (×3): 3 mL via INTRAVENOUS

## 2016-07-14 MED ORDER — HEPARIN SODIUM (PORCINE) 1000 UNIT/ML IJ SOLN
INTRAMUSCULAR | Status: AC
  Filled 2016-07-14: qty 1

## 2016-07-14 MED ORDER — SODIUM CHLORIDE 0.9 % WEIGHT BASED INFUSION: 1.0000 mL/kg/h | INTRAVENOUS | Status: AC

## 2016-07-14 MED ORDER — FENTANYL CITRATE (PF) 100 MCG/2ML IJ SOLN
INTRAMUSCULAR | Status: AC
  Filled 2016-07-14: qty 2

## 2016-07-14 MED ORDER — HEART ATTACK BOUNCING BOOK
Freq: Once | Status: DC
  Filled 2016-07-14: qty 1

## 2016-07-14 MED ORDER — LIDOCAINE HCL (PF) 1 % IJ SOLN
INTRAMUSCULAR | Status: DC | PRN
  Administered 2016-07-14: 2 mL via SUBCUTANEOUS

## 2016-07-14 MED ORDER — IOPAMIDOL (ISOVUE-370) INJECTION 76%
INTRAVENOUS | Status: AC
  Filled 2016-07-14: qty 100

## 2016-07-14 MED ORDER — HEPARIN (PORCINE) IN NACL 100-0.45 UNIT/ML-% IJ SOLN
1050.0000 [IU]/h | INTRAMUSCULAR | Status: DC
  Administered 2016-07-15: 1050 [IU]/h via INTRAVENOUS

## 2016-07-14 MED ORDER — ASPIRIN 81 MG PO CHEW
81.0000 mg | CHEWABLE_TABLET | Freq: Every day | ORAL | Status: DC
  Administered 2016-07-15 – 2016-07-16 (×2): 81 mg via ORAL
  Filled 2016-07-14 (×2): qty 1

## 2016-07-14 MED ORDER — HEART ATTACK BOUNCING BOOK
Freq: Once | Status: AC
  Administered 2016-07-14: 20:00:00
  Filled 2016-07-14: qty 1

## 2016-07-14 MED ORDER — NITROGLYCERIN 1 MG/10 ML FOR IR/CATH LAB
INTRA_ARTERIAL | Status: AC
  Filled 2016-07-14: qty 10

## 2016-07-14 MED ORDER — FENTANYL CITRATE (PF) 100 MCG/2ML IJ SOLN
INTRAMUSCULAR | Status: DC | PRN
  Administered 2016-07-14: 25 ug via INTRAVENOUS

## 2016-07-14 MED ORDER — HEPARIN (PORCINE) IN NACL 2-0.9 UNIT/ML-% IJ SOLN
INTRAMUSCULAR | Status: AC
  Filled 2016-07-14: qty 1000

## 2016-07-14 SURGICAL SUPPLY — 19 items
BALLN EMERGE MR 2.5X12 (BALLOONS) ×2
BALLN EUPHORA RX 1.5X10 (BALLOONS) ×2
BALLN EUPHORA RX 2.0X10 (BALLOONS) ×2
BALLOON EMERGE MR 2.5X12 (BALLOONS) ×1 IMPLANT
BALLOON EUPHORA RX 1.5X10 (BALLOONS) ×1 IMPLANT
BALLOON EUPHORA RX 2.0X10 (BALLOONS) ×1 IMPLANT
CATH INFINITI 5 FR JL3.5 (CATHETERS) ×2 IMPLANT
CATH INFINITI JR4 5F (CATHETERS) ×2 IMPLANT
CATH LAUNCHER 6FR EBU3.5 (CATHETERS) ×2 IMPLANT
GLIDESHEATH SLEND SS 6F .021 (SHEATH) ×2 IMPLANT
GUIDEWIRE INQWIRE 1.5J.035X260 (WIRE) ×1 IMPLANT
INQWIRE 1.5J .035X260CM (WIRE) ×2
KIT ENCORE 26 ADVANTAGE (KITS) ×2 IMPLANT
KIT HEART LEFT (KITS) ×2 IMPLANT
PACK CARDIAC CATHETERIZATION (CUSTOM PROCEDURE TRAY) ×2 IMPLANT
SYR MEDRAD MARK V 150ML (SYRINGE) ×2 IMPLANT
TRANSDUCER W/STOPCOCK (MISCELLANEOUS) ×2 IMPLANT
TUBING CIL FLEX 10 FLL-RA (TUBING) ×2 IMPLANT
WIRE ASAHI PROWATER 180CM (WIRE) ×2 IMPLANT

## 2016-07-14 NOTE — Progress Notes (Signed)
Left floor to cath room by bed; awake alert, and oriented.

## 2016-07-14 NOTE — Consult Note (Signed)
           [facility] CM Primary Care Navigator  07/14/2016  Patty [name] 31-May-1930 [id number]   Went to see patientat the bedside to identify possible discharge needsbut staff reports that patient is in cath lab for a procedure at this time. Family member states that patient may be transferred to another unit after the procedure.  Will attempt to meet with patient at another time to her new room/ location.  For questions, please contact:  Dannielle Huh, BSN, RN- [facility] Primary Care Navigator  Telephone: [phone] [place]

## 2016-07-14 NOTE — Progress Notes (Signed)
ANTICOAGULATION CONSULT NOTE - Follow Up Consult  Pharmacy Consult for Heparin Indication: chest pain/ACS  Allergies  Allergen Reactions  . Bee Venom Anaphylaxis  . Other Nausea And Vomiting and Other (See Comments)    Pain medications cause severe vomiting. Tolerated slow IV morphine drip    Patient Measurements: Height: 5' (152.4 cm) Weight: 171 lb 3.2 oz (77.7 kg) IBW/kg (Calculated) : 45.5 Heparin Dosing Weight:  62.7 kg  Vital Signs: Temp: 97.9 F (36.6 C) (06/11 0541) Temp Source: Oral (06/11 0541) BP: 118/62 (06/11 0541) Pulse Rate: 75 (06/11 0541)  Labs:  Recent Labs  07/12/16 0236 07/13/16 0416 07/13/16 1344 07/13/16 2327 07/14/16 0245 07/14/16 0450  HGB 10.4* 9.9*  --   --  9.7*  --   HCT 32.2* 30.6*  --   --  30.5*  --   PLT 314 346  --   --  387  --   LABPROT 24.4* 21.8*  --   --  20.6*  --   INR 2.15 1.88  --   --  1.75  --   HEPARINUNFRC  --   --  0.17* 0.32 0.37  --   CREATININE 1.07* 1.06*  --   --   --  1.02*    Estimated Creatinine Clearance: 38.5 mL/min (A) (by C-G formula based on SCr of 1.02 mg/dL (H)).  Assessment:  Anticoag: Afib on Coumadin PTA holding Coumadin with plans for cath once INR down. Now started on IV heparin with INR 1.75 today. HL 0.37. Hgb 9.7 drifting down slowly. Plts 387 ok.  Goal of Therapy:  Heparin level 0.3-0.7 units/ml Monitor platelets by anticoagulation protocol: Yes   Plan:  -Heparin 1050 units/hr -Daily HL, CBC, S/Sx bleeding -Cath today   Alesia Oshields S. Alford Highland, PharmD, BCPS Clinical Staff Pharmacist Pager [phone]  Eilene Ghazi Stillinger 07/14/2016,10:09 AM

## 2016-07-14 NOTE — Progress Notes (Signed)
ANTICOAGULATION CONSULT NOTE - Follow Up Consult  Pharmacy Consult for Heparin (warfarin on hold) Indication: chest pain/ACS and atrial fibrillation  Patient Measurements: Height: 5' (152.4 cm) Weight: 171 lb 3.2 oz (77.7 kg) IBW/kg (Calculated) : 45.5  Vital Signs: Temp: 98.5 F (36.9 C) (06/11 1516) Temp Source: Oral (06/11 1516) BP: 155/78 (06/11 1809) Pulse Rate: 0 (06/11 1818)  Labs:  Recent Labs  07/12/16 0236 07/13/16 0416 07/13/16 1344 07/13/16 2327 07/14/16 0245 07/14/16 0450  HGB 10.4* 9.9*  --   --  9.7*  --   HCT 32.2* 30.6*  --   --  30.5*  --   PLT 314 346  --   --  387  --   LABPROT 24.4* 21.8*  --   --  20.6*  --   INR 2.15 1.88  --   --  1.75  --   HEPARINUNFRC  --   --  0.17* 0.32 0.37  --   CREATININE 1.07* 1.06*  --   --   --  1.02*    Estimated Creatinine Clearance: 38.5 mL/min (A) (by C-G formula based on SCr of 1.02 mg/dL (H)).   Assessment: Heparin while warfarin on hold for cath 6/11. Heparin level therapeutic this am prior to cath.   S/p unsuccessful PCI to distal LCx, plan to restart heparin in 8 hours then bring patient back for atherectomy and stenting of LCx.   Goal of Therapy:  Heparin level 0.3-0.7 units/ml Monitor platelets by anticoagulation protocol: Yes   Plan:  -Restart heparin at 1050 units/hr - 6/12 0230am -Heparin level and cbc in am  Erin Hearing PharmD., [facility] Clinical Pharmacist Pager [phone] 07/14/2016 6:44 PM

## 2016-07-14 NOTE — Progress Notes (Signed)
PROGRESS NOTE    Patty [name]  WFU:[id number]  DOB: 05-Jul-1930  DOA: 07/10/2016 PCP: [name], MD   Brief Admission Hx: Patty [name] is a 83 y.o. female with a past medical history significant for CAD s/p PCI, Afib on warfarin, CHF EF 45%, tachy-brady s/p PPM battery now dead, and HTN who presents with 2 weeks progressive exertional dyspnea and chest pain  MDM/Assessment & Plan:   NSTEMI Chest Pain Unstable Angina - Troponin peaked at 0.06.  Pt denies recent chest pain. Cardiology following and planning for cath later today.  Continue telemetry monitoring.    Afib/flutter - holding warfarin, heparin per pharmacy. Rate is much better controlled with adjustment of diltiazem dose by cardiologist.   Hypokalemia - given oral replacement, Magnesium ok.    Chronic Systolic CHF - stable, compensated.   Squamous cell cancer lip - pt reports she is going to have Mohs biopsy in a few weeks  Ovarian cancer - outpatient follow up with Gyn oncology.  Leukocytosis - no fever, follow CBC, abnormal UA, will treat for UTI  UTI - ceftriaxone IV x 3 days, follow urine culture.   DVT prophylaxis: heparin Code Status: DNR Family Communication: none present Disposition Plan: TBD   Consultants:  cardiology  Subjective: Pt prepared for cath today, no chest pain, no SOB.     Objective: Vitals:   07/13/16 1300 07/13/16 2048 07/14/16 0541 07/14/16 0835  BP:  132/73 118/62   Pulse: 65 75 75   Resp: (!) 21 20 20    Temp: 98.6 F (37 C) 98.7 F (37.1 C) 97.9 F (36.6 C)   TempSrc: Oral Oral Oral   SpO2:  97% 98%   Weight:    77.7 kg (171 lb 3.2 oz)  Height:        Intake/Output Summary (Last 24 hours) at 07/14/16 0914 Last data filed at 07/14/16 0532  Gross per 24 hour  Intake          1137.77 ml  Output              250 ml  Net           887.77 ml   Filed Weights   07/10/16 2239 07/11/16 0334 07/14/16 0835  Weight: 77.6 kg (171 lb) 76.2 kg (167 lb 15.9 oz) 77.7 kg  (171 lb 3.2 oz)     REVIEW OF SYSTEMS  As per history otherwise all reviewed and reported negative  Exam:  General exam: awake, alert, NAD.  Respiratory system: Clear. No increased work of breathing. Cardiovascular system: S1 & S2 heard, irregular.  Gastrointestinal system: Abdomen is nondistended, soft and nontender. Normal bowel sounds heard. Central nervous system: Alert and oriented. No focal neurological deficits. Skin: squamous cell lower lip lesion  Extremities: no Cyanosis.   Data Reviewed: Basic Metabolic Panel:  Recent Labs Lab 07/10/16 2340 07/11/16 0414 07/11/16 0926 07/12/16 0236 07/13/16 0416 07/14/16 0450  NA 138  --  139 138 137 139  K 3.2*  --  3.1* 3.9 3.2* 3.9  CL 100*  --  102 102 100* 104  CO2 27  --  29 28 30 28   GLUCOSE 129*  --  135* 124* 112* 109*  BUN 17  --  14 17 19 17   CREATININE 1.06*  --  0.98 1.07* 1.06* 1.02*  CALCIUM 8.1*  --  8.1* 8.0* 8.0* 8.0*  MG  --  1.9  --  1.9  --   --    Liver  Function Tests:  Recent Labs Lab 07/12/16 0236  AST 40  ALT 29  ALKPHOS 100  BILITOT 1.3*  PROT 5.7*  ALBUMIN 2.5*   No results for input(s): LIPASE, AMYLASE in the last 168 hours. No results for input(s): AMMONIA in the last 168 hours. CBC:  Recent Labs Lab 07/10/16 2340 07/12/16 0236 07/13/16 0416 07/14/16 0245  WBC 12.2* 15.7* 13.1* 9.8  NEUTROABS  --   --  10.7*  --   HGB 10.5* 10.4* 9.9* 9.7*  HCT 32.9* 32.2* 30.6* 30.5*  MCV 96.8 96.7 97.1 96.8  PLT 367 314 346 387   Cardiac Enzymes:  Recent Labs Lab 07/10/16 2340 07/11/16 0414 07/11/16 0926  TROPONINI 0.03* 0.04* 0.06*   CBG (last 3)  No results for input(s): GLUCAP in the last 72 hours. Recent Results (from the past 240 hour(s))  MRSA PCR Screening     Status: None   Collection Time: 07/11/16  5:17 AM  Result Value Ref Range Status   MRSA by PCR NEGATIVE NEGATIVE Final    Comment:        The GeneXpert MRSA Assay (FDA approved for NASAL specimens only), is  one component of a comprehensive MRSA colonization surveillance program. It is not intended to diagnose MRSA infection nor to guide or monitor treatment for MRSA infections.     Studies: No results found. Scheduled Meds: . atorvastatin  80 mg Oral q morning - 10a  . clopidogrel  75 mg Oral Daily  . diltiazem  120 mg Oral QHS  . diltiazem  240 mg Oral Daily  . furosemide  80 mg Oral Daily  . isosorbide mononitrate  60 mg Oral Daily  . metoprolol tartrate  100 mg Oral BID  . sodium chloride flush  3 mL Intravenous Q12H   Continuous Infusions: . sodium chloride    . sodium chloride 1 mL/kg/hr (07/14/16 0700)  . heparin 1,050 Units/hr (07/14/16 0532)   Principal Problem:   Unstable angina ([name]) Active Problems:   CAD (coronary artery disease)   Essential hypertension   Combined systolic and diastolic heart failure, NYHA class 3 ([name])   Pacemaker - St Jude Leadless PPM   Chest pain   Atrial flutter ([name])   Hypokalemia   OSA on CPAP   NSTEMI (non-ST elevated myocardial infarction) ([place])   Normocytic anemia  Time spent:  Irwin Brakeman, MD, FAAFP [facility] Pager [phone]  If 7PM-7AM, please contact night-coverage [email or web address] Password [email or web address] 07/14/2016, 9:14 AM    LOS: 3 days

## 2016-07-14 NOTE — H&P (View-Only) (Signed)
Progress Note  Patient Name: Patty [name] Date of Encounter: 07/14/2016  Primary Cardiologist: Irish Lack  Subjective   Feeling well, no chest pain.   Inpatient Medications    Scheduled Meds: . atorvastatin  80 mg Oral q morning - 10a  . clopidogrel  75 mg Oral Daily  . diltiazem  120 mg Oral QHS  . diltiazem  240 mg Oral Daily  . furosemide  80 mg Oral Daily  . isosorbide mononitrate  60 mg Oral Daily  . metoprolol tartrate  100 mg Oral BID  . sodium chloride flush  3 mL Intravenous Q12H   Continuous Infusions: . sodium chloride    . sodium chloride    . cefTRIAXone (ROCEPHIN)  IV Stopped (07/13/16 1005)  . heparin 1,050 Units/hr (07/14/16 0532)   PRN Meds: sodium chloride, acetaminophen, metoprolol tartrate, nitroGLYCERIN, ondansetron (ZOFRAN) IV, sodium chloride flush   Vital Signs    Vitals:   07/13/16 1006 07/13/16 1300 07/13/16 2048 07/14/16 0541  BP: (!) 150/58  132/73 118/62  Pulse:  65 75 75  Resp:  (!) 21 20 20   Temp:  98.6 F (37 C) 98.7 F (37.1 C) 97.9 F (36.6 C)  TempSrc:  Oral Oral Oral  SpO2:   97% 98%  Weight:      Height:        Intake/Output Summary (Last 24 hours) at 07/14/16 0748 Last data filed at 07/14/16 0532  Gross per 24 hour  Intake          1357.77 ml  Output              250 ml  Net          1107.77 ml   Filed Weights   07/10/16 2239 07/11/16 0334  Weight: 171 lb (77.6 kg) 167 lb 15.9 oz (76.2 kg)    Telemetry    Afib (rate controlled) - Personally Reviewed  ECG    N/A - Personally Reviewed  Physical Exam   General: Well developed, well nourished, W female appearing in no acute distress. Head: Normocephalic, atraumatic.  Neck: Supple without bruits, JVD. Lungs:  Resp regular and unlabored, CTA. Heart: Irreg Irreg, S1, S2, no S3, S4, or murmur; no rub. Abdomen: Soft, non-tender, non-distended with normoactive bowel sounds. No hepatomegaly. No rebound/guarding. No obvious abdominal masses. Extremities: No  clubbing, cyanosis, edema. Distal pedal pulses are 2+ bilaterally. Neuro: Alert and oriented X 3. Moves all extremities spontaneously. Psych: Normal affect.  Labs    Chemistry Recent Labs Lab 07/12/16 0236 07/13/16 0416 07/14/16 0450  NA 138 137 139  K 3.9 3.2* 3.9  CL 102 100* 104  CO2 28 30 28   GLUCOSE 124* 112* 109*  BUN 17 19 17   CREATININE 1.07* 1.06* 1.02*  CALCIUM 8.0* 8.0* 8.0*  PROT 5.7*  --   --   ALBUMIN 2.5*  --   --   AST 40  --   --   ALT 29  --   --   ALKPHOS 100  --   --   BILITOT 1.3*  --   --   GFRNONAA 47* 47* 49*  GFRAA 54* 55* 57*  ANIONGAP 8 7 7      Hematology Recent Labs Lab 07/12/16 0236 07/13/16 0416 07/14/16 0245  WBC 15.7* 13.1* 9.8  RBC 3.33* 3.15* 3.15*  HGB 10.4* 9.9* 9.7*  HCT 32.2* 30.6* 30.5*  MCV 96.7 97.1 96.8  MCH 31.2 31.4 30.8  MCHC 32.3 32.4 31.8  RDW 14.9  15.0 15.1  PLT 314 346 387    Cardiac Enzymes Recent Labs Lab 07/10/16 2340 07/11/16 0414 07/11/16 0926  TROPONINI 0.03* 0.04* 0.06*    Recent Labs Lab 07/11/16 0005  TROPIPOC 0.02     BNP Recent Labs Lab 07/10/16 2340  BNP 361.3*     DDimer No results for input(s): DDIMER in the last 168 hours.    Radiology    No results found.  Cardiac Studies   Echo 07/11/16: Study Conclusions  - Left ventricle: There appears to be akinesis of the mid anteroseptal and apical anterior walls but likely due to LBBB. The cavity size was normal. There was moderate focal basal and mild concentric hypertrophy. Systolic function was normal. The estimated ejection fraction was in the range of 50% to 55%. Wall motion was normal; there were no regional wall motion abnormalities. - Ventricular septum: Septal motion showed severe dyssynergy. These changes are consistent with a left bundle branch block. - Aortic valve: Valve mobility was restricted. There was mild regurgitation. Valve area (VTI): 1.9 cm^2. Valve area (Vmax): 1.85 cm^2. Valve area  (Vmean): 1.78 cm^2. - Left atrium: The atrium was massively dilated. - Tricuspid valve: There was trivial regurgitation. - Pulmonic valve: There was mild regurgitation.  Patient Profile     83 y.o. female with complicated cardiac history including CAD with multiple stents, chronic combined systolic and diastolic heart failure, peripheral vascular disease, tachybradycardia syndrome, permanent atrial fibrillation, obstructive sleep apnea, who presented with recurring episodes of chest discomfort similar to prior episodes of unstable angina. In reviewing coronary anatomy she has ostial LAD disease with prior stenting.  Assessment & Plan    1. NSTEMI: Presented with 2 weeks of worsening exertional dyspnea and chest pain. Under recent personal stress. Trop peaked at 0.10. EKG with known LBBB. Echo this admission showed 50-55% EF with no new WMA.  -- remains on IV heparin, planned for cardiac cath today.   2. Permanent Afib/flutter: Coumadin held--> on IV heparin for cath today. INR 1.75 this morning. Rate controlled with oral Dilt.   3. Chronic combined HF: Appears stable on exam, able to lay flat in the bed.   4. Tachy-brady syndrome s/p leadless PPM: Battery known to be depleted. Currently just following at this time. No brady episodes of pauses noted.   5. Hypokalemia: Resolved.   Signed, Reino Bellis, NP  07/14/2016, 7:48 AM    Patient seen and examined. Agree with assessment and plan. No recurrent chest pain. Pt is anxiously awaiting cath, delayed due to emergent cases earlier in the day.  Last cath by Dr. Julianne Handler reviewed. Pt is fully aware of risks/benefits of procedure having had many procedures in the past.   Troy Sine, MD, [facility] 07/14/2016 3:05 PM

## 2016-07-14 NOTE — Progress Notes (Signed)
ANTICOAGULATION CONSULT NOTE - Follow Up Consult  Pharmacy Consult for Heparin (warfarin on hold) Indication: chest pain/ACS and atrial fibrillation  Patient Measurements: Height: 5' (152.4 cm) Weight: 167 lb 15.9 oz (76.2 kg) IBW/kg (Calculated) : 45.5  Vital Signs: Temp: 98.7 F (37.1 C) (06/10 2048) Temp Source: Oral (06/10 2048) BP: 132/73 (06/10 2048) Pulse Rate: 75 (06/10 2048)  Labs:  Recent Labs  07/11/16 0414 07/11/16 0926 07/11/16 1147 07/12/16 0236 07/13/16 0416 07/13/16 1344 07/13/16 2327  HGB  --   --   --  10.4* 9.9*  --   --   HCT  --   --   --  32.2* 30.6*  --   --   PLT  --   --   --  314 346  --   --   LABPROT  --   --  26.9* 24.4* 21.8*  --   --   INR  --   --  2.44 2.15 1.88  --   --   HEPARINUNFRC  --   --   --   --   --  0.17* 0.32  CREATININE  --  0.98  --  1.07* 1.06*  --   --   TROPONINI 0.04* 0.06*  --   --   --   --   --     Estimated Creatinine Clearance: 36.7 mL/min (A) (by C-G formula based on SCr of 1.06 mg/dL (H)).   Assessment: Heparin while warfarin on hold in anticipation of cath 6/11 AM. Heparin level therapeutic after rate increase.   Goal of Therapy:  Heparin level 0.3-0.7 units/ml Monitor platelets by anticoagulation protocol: Yes   Plan:  -Cont heparin at 1050 units/hr -Confirmatory HL with AM labs  Narda Bonds 07/14/2016,12:01 AM

## 2016-07-14 NOTE — Progress Notes (Signed)
Progress Note  Patient Name: Patty [name] Date of Encounter: 07/14/2016  Primary Cardiologist: Irish Lack  Subjective   Feeling well, no chest pain.   Inpatient Medications    Scheduled Meds: . atorvastatin  80 mg Oral q morning - 10a  . clopidogrel  75 mg Oral Daily  . diltiazem  120 mg Oral QHS  . diltiazem  240 mg Oral Daily  . furosemide  80 mg Oral Daily  . isosorbide mononitrate  60 mg Oral Daily  . metoprolol tartrate  100 mg Oral BID  . sodium chloride flush  3 mL Intravenous Q12H   Continuous Infusions: . sodium chloride    . sodium chloride    . cefTRIAXone (ROCEPHIN)  IV Stopped (07/13/16 1005)  . heparin 1,050 Units/hr (07/14/16 0532)   PRN Meds: sodium chloride, acetaminophen, metoprolol tartrate, nitroGLYCERIN, ondansetron (ZOFRAN) IV, sodium chloride flush   Vital Signs    Vitals:   07/13/16 1006 07/13/16 1300 07/13/16 2048 07/14/16 0541  BP: (!) 150/58  132/73 118/62  Pulse:  65 75 75  Resp:  (!) 21 20 20   Temp:  98.6 F (37 C) 98.7 F (37.1 C) 97.9 F (36.6 C)  TempSrc:  Oral Oral Oral  SpO2:   97% 98%  Weight:      Height:        Intake/Output Summary (Last 24 hours) at 07/14/16 0748 Last data filed at 07/14/16 0532  Gross per 24 hour  Intake          1357.77 ml  Output              250 ml  Net          1107.77 ml   Filed Weights   07/10/16 2239 07/11/16 0334  Weight: 171 lb (77.6 kg) 167 lb 15.9 oz (76.2 kg)    Telemetry    Afib (rate controlled) - Personally Reviewed  ECG    N/A - Personally Reviewed  Physical Exam   General: Well developed, well nourished, W female appearing in no acute distress. Head: Normocephalic, atraumatic.  Neck: Supple without bruits, JVD. Lungs:  Resp regular and unlabored, CTA. Heart: Irreg Irreg, S1, S2, no S3, S4, or murmur; no rub. Abdomen: Soft, non-tender, non-distended with normoactive bowel sounds. No hepatomegaly. No rebound/guarding. No obvious abdominal masses. Extremities: No  clubbing, cyanosis, edema. Distal pedal pulses are 2+ bilaterally. Neuro: Alert and oriented X 3. Moves all extremities spontaneously. Psych: Normal affect.  Labs    Chemistry Recent Labs Lab 07/12/16 0236 07/13/16 0416 07/14/16 0450  NA 138 137 139  K 3.9 3.2* 3.9  CL 102 100* 104  CO2 28 30 28   GLUCOSE 124* 112* 109*  BUN 17 19 17   CREATININE 1.07* 1.06* 1.02*  CALCIUM 8.0* 8.0* 8.0*  PROT 5.7*  --   --   ALBUMIN 2.5*  --   --   AST 40  --   --   ALT 29  --   --   ALKPHOS 100  --   --   BILITOT 1.3*  --   --   GFRNONAA 47* 47* 49*  GFRAA 54* 55* 57*  ANIONGAP 8 7 7      Hematology Recent Labs Lab 07/12/16 0236 07/13/16 0416 07/14/16 0245  WBC 15.7* 13.1* 9.8  RBC 3.33* 3.15* 3.15*  HGB 10.4* 9.9* 9.7*  HCT 32.2* 30.6* 30.5*  MCV 96.7 97.1 96.8  MCH 31.2 31.4 30.8  MCHC 32.3 32.4 31.8  RDW 14.9  15.0 15.1  PLT 314 346 387    Cardiac Enzymes Recent Labs Lab 07/10/16 2340 07/11/16 0414 07/11/16 0926  TROPONINI 0.03* 0.04* 0.06*    Recent Labs Lab 07/11/16 0005  TROPIPOC 0.02     BNP Recent Labs Lab 07/10/16 2340  BNP 361.3*     DDimer No results for input(s): DDIMER in the last 168 hours.    Radiology    No results found.  Cardiac Studies   Echo 07/11/16: Study Conclusions  - Left ventricle: There appears to be akinesis of the mid anteroseptal and apical anterior walls but likely due to LBBB. The cavity size was normal. There was moderate focal basal and mild concentric hypertrophy. Systolic function was normal. The estimated ejection fraction was in the range of 50% to 55%. Wall motion was normal; there were no regional wall motion abnormalities. - Ventricular septum: Septal motion showed severe dyssynergy. These changes are consistent with a left bundle branch block. - Aortic valve: Valve mobility was restricted. There was mild regurgitation. Valve area (VTI): 1.9 cm^2. Valve area (Vmax): 1.85 cm^2. Valve area  (Vmean): 1.78 cm^2. - Left atrium: The atrium was massively dilated. - Tricuspid valve: There was trivial regurgitation. - Pulmonic valve: There was mild regurgitation.  Patient Profile     83 y.o. female with complicated cardiac history including CAD with multiple stents, chronic combined systolic and diastolic heart failure, peripheral vascular disease, tachybradycardia syndrome, permanent atrial fibrillation, obstructive sleep apnea, who presented with recurring episodes of chest discomfort similar to prior episodes of unstable angina. In reviewing coronary anatomy she has ostial LAD disease with prior stenting.  Assessment & Plan    1. NSTEMI: Presented with 2 weeks of worsening exertional dyspnea and chest pain. Under recent personal stress. Trop peaked at 0.10. EKG with known LBBB. Echo this admission showed 50-55% EF with no new WMA.  -- remains on IV heparin, planned for cardiac cath today.   2. Permanent Afib/flutter: Coumadin held--> on IV heparin for cath today. INR 1.75 this morning. Rate controlled with oral Dilt.   3. Chronic combined HF: Appears stable on exam, able to lay flat in the bed.   4. Tachy-brady syndrome s/p leadless PPM: Battery known to be depleted. Currently just following at this time. No brady episodes of pauses noted.   5. Hypokalemia: Resolved.   Signed, Reino Bellis, NP  07/14/2016, 7:48 AM    Patient seen and examined. Agree with assessment and plan. No recurrent chest pain. Pt is anxiously awaiting cath, delayed due to emergent cases earlier in the day.  Last cath by Dr. Julianne Handler reviewed. Pt is fully aware of risks/benefits of procedure having had many procedures in the past.   Troy Sine, MD, [facility] 07/14/2016 3:05 PM

## 2016-07-14 NOTE — Interval H&P Note (Signed)
History and Physical Interval Note:  07/14/2016 5:18 PM  Patty [name]  has presented today for surgery, with the diagnosis of nstemi  The various methods of treatment have been discussed with the patient and family. After consideration of risks, benefits and other options for treatment, the patient has consented to  Procedure(s): Left Heart Cath and Coronary Angiography (N/A) as a surgical intervention .  The patient's history has been reviewed, patient examined, no change in status, stable for surgery.  I have reviewed the patient's chart and labs.  Questions were answered to the patient's satisfaction.   Cath Lab Visit (complete for each Cath Lab visit)  Clinical Evaluation Leading to the Procedure:   ACS: Yes.    Non-ACS:    Anginal Classification: CCS III  Anti-ischemic medical therapy: Maximal Therapy (2 or more classes of medications)  Non-Invasive Test Results: No non-invasive testing performed  Prior CABG: No previous CABG        Patty [name] 07/14/2016 5:18 PM

## 2016-07-15 ENCOUNTER — Encounter (HOSPITAL_COMMUNITY)
Admission: EM | Disposition: A | Discharge status: Home / Self Care | Payer: Self-pay | Source: Home / Self Care | Attending: Family Medicine

## 2016-07-15 ENCOUNTER — Encounter (HOSPITAL_COMMUNITY): Payer: Self-pay | Admitting: Cardiology

## 2016-07-15 DIAGNOSIS — I2510 Atherosclerotic heart disease of native coronary artery without angina pectoris: Secondary | ICD-10-CM

## 2016-07-15 DIAGNOSIS — D649 Anemia, unspecified: Secondary | ICD-10-CM

## 2016-07-15 HISTORY — PX: CORONARY ATHERECTOMY: CATH118238

## 2016-07-15 HISTORY — PX: CORONARY STENT INTERVENTION: CATH118234

## 2016-07-15 LAB — BASIC METABOLIC PANEL
ANION GAP: 7 (ref 5–15)
BUN: 15 mg/dL (ref 6–20)
CALCIUM: 7.8 mg/dL — AB (ref 8.9–10.3)
CO2: 26 mmol/L (ref 22–32)
CREATININE: 0.96 mg/dL (ref 0.44–1.00)
Chloride: 104 mmol/L (ref 101–111)
GFR, EST NON AFRICAN AMERICAN: 53 mL/min — AB (ref >60)
GLUCOSE: 105 mg/dL — AB (ref 65–99)
Potassium: 3.2 mmol/L — ABNORMAL LOW (ref 3.5–5.1)
Sodium: 137 mmol/L (ref 135–145)

## 2016-07-15 LAB — POCT ACTIVATED CLOTTING TIME
ACTIVATED CLOTTING TIME: 241 s
Activated Clotting Time: 285 seconds

## 2016-07-15 LAB — CBC
HEMATOCRIT: 30.1 % — AB (ref 36.0–46.0)
HEMOGLOBIN: 9.5 g/dL — AB (ref 12.0–15.0)
MCH: 30.1 pg (ref 26.0–34.0)
MCHC: 31.6 g/dL (ref 30.0–36.0)
MCV: 95.3 fL (ref 78.0–100.0)
Platelets: 379 10*3/uL (ref 150–400)
RBC: 3.16 MIL/uL — AB (ref 3.87–5.11)
RDW: 14.7 % (ref 11.5–15.5)
WBC: 7.5 10*3/uL (ref 4.0–10.5)

## 2016-07-15 LAB — PROTIME-INR
INR: 1.61
Prothrombin Time: 19.3 seconds — ABNORMAL HIGH (ref 11.4–15.2)

## 2016-07-15 LAB — HEPARIN LEVEL (UNFRACTIONATED): Heparin Unfractionated: 0.32 IU/mL (ref 0.30–0.70)

## 2016-07-15 SURGERY — CORONARY ATHERECTOMY
Anesthesia: LOCAL

## 2016-07-15 MED ORDER — VIPERSLIDE LUBRICANT OPTIME
TOPICAL | Status: DC | PRN
  Administered 2016-07-15: 13:00:00 via SURGICAL_CAVITY

## 2016-07-15 MED ORDER — NITROGLYCERIN 1 MG/10 ML FOR IR/CATH LAB
INTRA_ARTERIAL | Status: AC
  Filled 2016-07-15: qty 10

## 2016-07-15 MED ORDER — IOPAMIDOL (ISOVUE-370) INJECTION 76%
INTRAVENOUS | Status: DC | PRN
  Administered 2016-07-15: 100 mL via INTRA_ARTERIAL

## 2016-07-15 MED ORDER — WARFARIN - PHARMACIST DOSING INPATIENT
Freq: Every day | Status: DC
  Administered 2016-07-15: 18:00:00

## 2016-07-15 MED ORDER — SODIUM CHLORIDE 0.9 % WEIGHT BASED INFUSION
1.0000 mL/kg/h | INTRAVENOUS | Status: AC
  Administered 2016-07-15: 1 mL/kg/h via INTRAVENOUS

## 2016-07-15 MED ORDER — HEPARIN SODIUM (PORCINE) 1000 UNIT/ML IJ SOLN
INTRAMUSCULAR | Status: AC
  Filled 2016-07-15: qty 1

## 2016-07-15 MED ORDER — SODIUM CHLORIDE 0.9 % IV SOLN: 250.0000 mL | INTRAVENOUS | Status: DC | PRN

## 2016-07-15 MED ORDER — SODIUM CHLORIDE 0.9 % IV SOLN
INTRAVENOUS | Status: DC
  Administered 2016-07-15: 11:00:00 via INTRAVENOUS

## 2016-07-15 MED ORDER — SODIUM CHLORIDE 0.9% FLUSH: 3.0000 mL | Freq: Two times a day (BID) | INTRAVENOUS | Status: DC

## 2016-07-15 MED ORDER — LABETALOL HCL 5 MG/ML IV SOLN: 10.0000 mg | INTRAVENOUS | Status: AC | PRN

## 2016-07-15 MED ORDER — HEPARIN SODIUM (PORCINE) 1000 UNIT/ML IJ SOLN
INTRAMUSCULAR | Status: DC | PRN
  Administered 2016-07-15: 6000 [IU] via INTRAVENOUS
  Administered 2016-07-15: 3000 [IU] via INTRAVENOUS
  Administered 2016-07-15: 1000 [IU] via INTRAVENOUS

## 2016-07-15 MED ORDER — NITROGLYCERIN 1 MG/10 ML FOR IR/CATH LAB
INTRA_ARTERIAL | Status: DC | PRN
  Administered 2016-07-15: 200 ug via INTRACORONARY

## 2016-07-15 MED ORDER — WARFARIN SODIUM 4 MG PO TABS
4.0000 mg | ORAL_TABLET | Freq: Once | ORAL | Status: AC
  Administered 2016-07-15: 4 mg via ORAL
  Filled 2016-07-15: qty 1

## 2016-07-15 MED ORDER — MIDAZOLAM HCL 2 MG/2ML IJ SOLN
INTRAMUSCULAR | Status: DC | PRN
  Administered 2016-07-15: 1 mg via INTRAVENOUS

## 2016-07-15 MED ORDER — IOPAMIDOL (ISOVUE-370) INJECTION 76%
INTRAVENOUS | Status: AC
  Filled 2016-07-15: qty 100

## 2016-07-15 MED ORDER — HEPARIN (PORCINE) IN NACL 2-0.9 UNIT/ML-% IJ SOLN
INTRAMUSCULAR | Status: DC | PRN
  Administered 2016-07-15: 10 mL via INTRA_ARTERIAL

## 2016-07-15 MED ORDER — POTASSIUM CHLORIDE CRYS ER 20 MEQ PO TBCR
40.0000 meq | EXTENDED_RELEASE_TABLET | Freq: Once | ORAL | Status: AC
  Administered 2016-07-15: 40 meq via ORAL
  Filled 2016-07-15: qty 2

## 2016-07-15 MED ORDER — HEPARIN (PORCINE) IN NACL 2-0.9 UNIT/ML-% IJ SOLN
INTRAMUSCULAR | Status: AC
  Filled 2016-07-15: qty 1000

## 2016-07-15 MED ORDER — HEPARIN (PORCINE) IN NACL 2-0.9 UNIT/ML-% IJ SOLN
INTRAMUSCULAR | Status: AC | PRN
  Administered 2016-07-15: 1500 mL

## 2016-07-15 MED ORDER — LIDOCAINE HCL (PF) 1 % IJ SOLN
INTRAMUSCULAR | Status: DC | PRN
  Administered 2016-07-15: 2 mL

## 2016-07-15 MED ORDER — HYDRALAZINE HCL 20 MG/ML IJ SOLN: 5.0000 mg | INTRAMUSCULAR | Status: AC | PRN

## 2016-07-15 MED ORDER — LIDOCAINE HCL (PF) 1 % IJ SOLN
INTRAMUSCULAR | Status: AC
  Filled 2016-07-15: qty 30

## 2016-07-15 MED ORDER — IOPAMIDOL (ISOVUE-370) INJECTION 76%
INTRAVENOUS | Status: AC
  Filled 2016-07-15: qty 125

## 2016-07-15 MED ORDER — SODIUM CHLORIDE 0.9% FLUSH: 3.0000 mL | INTRAVENOUS | Status: DC | PRN

## 2016-07-15 MED ORDER — MIDAZOLAM HCL 2 MG/2ML IJ SOLN
INTRAMUSCULAR | Status: AC
  Filled 2016-07-15: qty 2

## 2016-07-15 MED ORDER — VERAPAMIL HCL 2.5 MG/ML IV SOLN
INTRAVENOUS | Status: AC
  Filled 2016-07-15: qty 2

## 2016-07-15 MED ORDER — FENTANYL CITRATE (PF) 100 MCG/2ML IJ SOLN
INTRAMUSCULAR | Status: DC | PRN
Start: 2016-07-15 — End: 2016-07-15
  Administered 2016-07-15: 25 ug via INTRAVENOUS

## 2016-07-15 MED ORDER — FENTANYL CITRATE (PF) 100 MCG/2ML IJ SOLN
INTRAMUSCULAR | Status: AC
  Filled 2016-07-15: qty 2

## 2016-07-15 SURGICAL SUPPLY — 22 items
BALLN EMERGE MR 2.5X12 (BALLOONS) ×2
BALLN MINITREK OTW 1.20X6 (BALLOONS) ×2
BALLOON EMERGE MR 2.5X12 (BALLOONS) ×1 IMPLANT
BALLOON MINITREK OTW 1.20X6 (BALLOONS) ×1 IMPLANT
CATH INFINITI JR4 5F (CATHETERS) ×2 IMPLANT
CATH LAUNCHER 6FR EBU3.5 (CATHETERS) ×2 IMPLANT
CROWN DIAMONDBACK CLASSIC 1.25 (BURR) ×2 IMPLANT
DEVICE RAD COMP TR BAND LRG (VASCULAR PRODUCTS) ×2 IMPLANT
GLIDESHEATH SLEND SS 6F .021 (SHEATH) ×2 IMPLANT
GUIDEWIRE INQWIRE 1.5J.035X260 (WIRE) ×1 IMPLANT
INQWIRE 1.5J .035X260CM (WIRE) ×2
KIT ENCORE 26 ADVANTAGE (KITS) ×2 IMPLANT
KIT HEART LEFT (KITS) ×2 IMPLANT
LUBRICANT VIPERSLIDE CORONARY (MISCELLANEOUS) ×2 IMPLANT
PACK CARDIAC CATHETERIZATION (CUSTOM PROCEDURE TRAY) ×2 IMPLANT
STENT PROMUS PREM MR 2.5X12 (Permanent Stent) ×2 IMPLANT
TRANSDUCER W/STOPCOCK (MISCELLANEOUS) ×2 IMPLANT
TUBING CIL FLEX 10 FLL-RA (TUBING) ×2 IMPLANT
WIRE ASAHI PROWATER 300CM (WIRE) ×2 IMPLANT
WIRE HI TORQ BMW 300CM (WIRE) ×2 IMPLANT
WIRE HI TORQ VERSACORE-J 145CM (WIRE) ×4 IMPLANT
WIRE VIPER ADVANCE COR .012TIP (WIRE) ×2 IMPLANT

## 2016-07-15 NOTE — Progress Notes (Signed)
PROGRESS NOTE    Patty [name]  ERX:[id number]  DOB: 1931-01-27  DOA: 07/10/2016 PCP: [name], MD   Brief Admission Hx: Patty [name] is a 83 y.o. female with a past medical history significant for CAD s/p PCI, Afib on warfarin, CHF EF 45%, tachy-brady s/p PPM battery now dead, and HTN who presents with 2 weeks progressive exertional dyspnea and chest pain  MDM/Assessment & Plan:   NSTEMI Chest Pain Unstable Angina - Troponin peaked at 0.06.  Pt denies recent chest pain. Cardiology following and planning for cath later today.  Cath yesterday shows new blockage and PCI was unsuccessful, planning another attempt later today.   Continue telemetry monitoring.    Afib/flutter - holding warfarin, heparin per pharmacy. Rate is much better controlled with adjustment of diltiazem dose by cardiologist.   Hypokalemia - oral replacement ordered, Magnesium ok.  Recheck in AM.   Chronic Systolic CHF - stable, compensated.   Squamous cell cancer lip - pt reports she is going to have Mohs biopsy in a few weeks  Ovarian cancer - outpatient follow up with Gyn oncology.  Leukocytosis - no fever, follow CBC, abnormal UA, will treat for UTI  UTI - ceftriaxone IV x 3 days, follow urine culture.   DVT prophylaxis: heparin Code Status: DNR Family Communication: none present Disposition Plan: TBD   Consultants:  cardiology  Subjective: Pt having no chest pain, no SOB, going back to cath lab later today.     Objective: Vitals:   07/14/16 2100 07/14/16 2200 07/14/16 2300 07/15/16 0416  BP: (!) 130/52  124/60 (!) 153/59  Pulse:  95  75  Resp: 15 (!) 28 16 (!) 21  Temp:    97.9 F (36.6 C)  TempSrc:    Oral  SpO2:  97%  96%  Weight:    77.3 kg (170 lb 6.7 oz)  Height:        Intake/Output Summary (Last 24 hours) at 07/15/16 0813 Last data filed at 07/15/16 0500  Gross per 24 hour  Intake          1962.06 ml  Output             1001 ml  Net           961.06 ml   Filed  Weights   07/11/16 0334 07/14/16 0835 07/15/16 0416  Weight: 76.2 kg (167 lb 15.9 oz) 77.7 kg (171 lb 3.2 oz) 77.3 kg (170 lb 6.7 oz)     REVIEW OF SYSTEMS  As per history otherwise all reviewed and reported negative  Exam:  General exam: awake, alert, NAD.  Respiratory system: Clear. No increased work of breathing. Cardiovascular system: S1 & S2 heard, irregular.  Gastrointestinal system: Abdomen is nondistended, soft and nontender. Normal bowel sounds heard. Central nervous system: Alert and oriented. No focal neurological deficits. Skin: squamous cell lower lip lesion  Extremities: no Cyanosis.   Data Reviewed: Basic Metabolic Panel:  Recent Labs Lab 07/11/16 0414 07/11/16 0926 07/12/16 0236 07/13/16 0416 07/14/16 0450 07/15/16 0258  NA  --  139 138 137 139 137  K  --  3.1* 3.9 3.2* 3.9 3.2*  CL  --  102 102 100* 104 104  CO2  --  29 28 30 28 26   GLUCOSE  --  135* 124* 112* 109* 105*  BUN  --  14 17 19 17 15   CREATININE  --  0.98 1.07* 1.06* 1.02* 0.96  CALCIUM  --  8.1* 8.0* 8.0*  8.0* 7.8*  MG 1.9  --  1.9  --   --   --    Liver Function Tests:  Recent Labs Lab 07/12/16 0236  AST 40  ALT 29  ALKPHOS 100  BILITOT 1.3*  PROT 5.7*  ALBUMIN 2.5*   No results for input(s): LIPASE, AMYLASE in the last 168 hours. No results for input(s): AMMONIA in the last 168 hours. CBC:  Recent Labs Lab 07/10/16 2340 07/12/16 0236 07/13/16 0416 07/14/16 0245 07/15/16 0258  WBC 12.2* 15.7* 13.1* 9.8 7.5  NEUTROABS  --   --  10.7*  --   --   HGB 10.5* 10.4* 9.9* 9.7* 9.5*  HCT 32.9* 32.2* 30.6* 30.5* 30.1*  MCV 96.8 96.7 97.1 96.8 95.3  PLT 367 314 346 387 379   Cardiac Enzymes:  Recent Labs Lab 07/10/16 2340 07/11/16 0414 07/11/16 0926  TROPONINI 0.03* 0.04* 0.06*   CBG (last 3)  No results for input(s): GLUCAP in the last 72 hours. Recent Results (from the past 240 hour(s))  MRSA PCR Screening     Status: None   Collection Time: 07/11/16  5:17 AM    Result Value Ref Range Status   MRSA by PCR NEGATIVE NEGATIVE Final    Comment:        The GeneXpert MRSA Assay (FDA approved for NASAL specimens only), is one component of a comprehensive MRSA colonization surveillance program. It is not intended to diagnose MRSA infection nor to guide or monitor treatment for MRSA infections.     Studies: No results found. Scheduled Meds: . aspirin  81 mg Oral Daily  . atorvastatin  80 mg Oral q morning - 10a  . clopidogrel  75 mg Oral Daily  . diltiazem  120 mg Oral QHS  . diltiazem  240 mg Oral Daily  . furosemide  80 mg Oral Daily  . isosorbide mononitrate  60 mg Oral Daily  . metoprolol tartrate  100 mg Oral BID  . potassium chloride  40 mEq Oral Once  . sodium chloride flush  3 mL Intravenous Q12H   Continuous Infusions: . sodium chloride    . heparin 1,050 Units/hr (07/15/16 0230)   Principal Problem:   Unstable angina ([name]) Active Problems:   CAD (coronary artery disease)   Essential hypertension   Combined systolic and diastolic heart failure, NYHA class 3 ([name])   Pacemaker - St Jude Leadless PPM   Chest pain   Atrial flutter ([name])   Hypokalemia   OSA on CPAP   NSTEMI (non-ST elevated myocardial infarction) ([place])   Normocytic anemia  Time spent:  Irwin Brakeman, MD, FAAFP [facility] Pager [phone]  If 7PM-7AM, please contact night-coverage [email or web address] Password [facility] 07/15/2016, 8:13 AM    LOS: 4 days

## 2016-07-15 NOTE — Progress Notes (Signed)
ANTICOAGULATION CONSULT NOTE - Follow Up Consult  Pharmacy Consult for Coumadin Indication: h/o Afib  Patient Measurements: Height: 5' (152.4 cm) Weight: 170 lb 6.7 oz (77.3 kg) IBW/kg (Calculated) : 45.5  Vital Signs: Temp: 98 F (36.7 C) (06/12 1158) Temp Source: Oral (06/12 1158) BP: 148/75 (06/12 1335) Pulse Rate: 73 (06/12 1339)  Labs:  Recent Labs  07/13/16 0416  07/13/16 2327 07/14/16 0245 07/14/16 0450 07/15/16 0258 07/15/16 1033  HGB 9.9*  --   --  9.7*  --  9.5*  --   HCT 30.6*  --   --  30.5*  --  30.1*  --   PLT 346  --   --  387  --  379  --   LABPROT 21.8*  --   --  20.6*  --  19.3*  --   INR 1.88  --   --  1.75  --  1.61  --   HEPARINUNFRC  --   < > 0.32 0.37  --   --  0.32  CREATININE 1.06*  --   --   --  1.02* 0.96  --   < > = values in this interval not displayed. Estimated Creatinine Clearance: 40.8 mL/min (by C-G formula based on SCr of 0.96 mg/dL).   Assessment: Heparin while warfarin on hold for cath 6/11. Heparin level therapeutic this am prior to cath. INR 1.61 S/p unsuccessful PCI to distal LCx yesterday. Heparin level therapeutic this AM prior to cath. Patient back to cath lab today, now s/p successful atherectomy and stenting of LCx with DES.  Pharmacy consulted to restart coumadin.   Heparin dc'd.  Per cardiologist: continue DAPT with ASA/Plavix. Would discontinue ASA at one month. Resume Coumadin tonight. I would not bridge with IV heparin. Anticipate DC in am.  Pta coumadin dose:  2mg  daily except 1mg  qMF, last taken 07/09/16 INR was therapeutic on admission 07/10/16.   Goal of Therapy:  INR =2-3 Monitor platelets by anticoagulation protocol: Yes   Plan:  Heparin discontinued per Dr. Martinique Give Coumadin 4 mg po tonight Daily PT/INR  Nicole Cella, RPh Clinical Pharmacist Pager: [phone] 8A-4P [phone] 4P-10P [phone] [place] [phone] 07/15/2016 2:35 PM

## 2016-07-15 NOTE — Interval H&P Note (Signed)
History and Physical Interval Note:  07/15/2016 12:20 PM  Patty [name]  has presented today for surgery, with the diagnosis of cad  The various methods of treatment have been discussed with the patient and family. After consideration of risks, benefits and other options for treatment, the patient has consented to  Procedure(s): Coronary Atherectomy (N/A) as a surgical intervention .  The patient's history has been reviewed, patient examined, no change in status, stable for surgery.  I have reviewed the patient's chart and labs.  Questions were answered to the patient's satisfaction.   Cath Lab Visit (complete for each Cath Lab visit)  Clinical Evaluation Leading to the Procedure:   ACS: Yes.    Non-ACS:    Anginal Classification: CCS IV  Anti-ischemic medical therapy: Maximal Therapy (2 or more classes of medications)  Non-Invasive Test Results: No non-invasive testing performed  Prior CABG: No previous CABG       Patty [name] 07/15/2016 12:20 PM

## 2016-07-15 NOTE — Progress Notes (Signed)
TR BAND REMOVAL  LOCATION:  right radial  DEFLATED PER PROTOCOL:  Yes.    TIME BAND OFF / DRESSING APPLIED:   1800   SITE UPON ARRIVAL:   Level 1  SITE AFTER BAND REMOVAL:  Level 1  CIRCULATION SENSATION AND MOVEMENT:  Within Normal Limits  Yes.    COMMENTS:  Bruised

## 2016-07-15 NOTE — Progress Notes (Signed)
Progress Note  Patient Name: Patty [name] Date of Encounter: 07/15/2016  Primary Cardiologist: Irish Lack  Subjective   No chest pain this morning.   Inpatient Medications    Scheduled Meds: . aspirin  81 mg Oral Daily  . atorvastatin  80 mg Oral q morning - 10a  . clopidogrel  75 mg Oral Daily  . diltiazem  120 mg Oral QHS  . diltiazem  240 mg Oral Daily  . furosemide  80 mg Oral Daily  . isosorbide mononitrate  60 mg Oral Daily  . metoprolol tartrate  100 mg Oral BID  . sodium chloride flush  3 mL Intravenous Q12H   Continuous Infusions: . sodium chloride    . heparin 1,050 Units/hr (07/15/16 0230)   PRN Meds: sodium chloride, acetaminophen, metoprolol tartrate, nitroGLYCERIN, ondansetron (ZOFRAN) IV, sodium chloride flush   Vital Signs    Vitals:   07/14/16 2100 07/14/16 2200 07/14/16 2300 07/15/16 0416  BP: (!) 130/52  124/60 (!) 153/59  Pulse:  95  75  Resp: 15 (!) 28 16 (!) 21  Temp:    97.9 F (36.6 C)  TempSrc:    Oral  SpO2:  97%  96%  Weight:    170 lb 6.7 oz (77.3 kg)  Height:        Intake/Output Summary (Last 24 hours) at 07/15/16 0758 Last data filed at 07/15/16 0500  Gross per 24 hour  Intake          1962.06 ml  Output             1001 ml  Net           961.06 ml   Filed Weights   07/11/16 0334 07/14/16 0835 07/15/16 0416  Weight: 167 lb 15.9 oz (76.2 kg) 171 lb 3.2 oz (77.7 kg) 170 lb 6.7 oz (77.3 kg)    Telemetry    Afib Rate controlled - Personally Reviewed  ECG    N/a - Personally Reviewed  Physical Exam   General: Well developed, well nourished, female appearing in no acute distress. Head: Normocephalic, atraumatic.  Neck: Supple without bruits, JVD. Lungs:  Resp regular and unlabored, CTA. Heart: Irreg Irreg, S1, S2, no S3, S4, or murmur; no rub. Abdomen: Soft, non-tender, non-distended with normoactive bowel sounds. No hepatomegaly. No rebound/guarding. No obvious abdominal masses. Extremities: No clubbing, cyanosis,  edema. Distal pedal pulses are 2+ bilaterally. Right radial cath site stable.  Neuro: Alert and oriented X 3. Moves all extremities spontaneously. Psych: Normal affect.  Labs    Chemistry Recent Labs Lab 07/12/16 0236 07/13/16 0416 07/14/16 0450 07/15/16 0258  NA 138 137 139 137  K 3.9 3.2* 3.9 3.2*  CL 102 100* 104 104  CO2 28 30 28 26   GLUCOSE 124* 112* 109* 105*  BUN 17 19 17 15   CREATININE 1.07* 1.06* 1.02* 0.96  CALCIUM 8.0* 8.0* 8.0* 7.8*  PROT 5.7*  --   --   --   ALBUMIN 2.5*  --   --   --   AST 40  --   --   --   ALT 29  --   --   --   ALKPHOS 100  --   --   --   BILITOT 1.3*  --   --   --   GFRNONAA 47* 47* 49* 53*  GFRAA 54* 55* 57* >60  ANIONGAP 8 7 7 7      Hematology Recent Labs Lab 07/13/16 0416 07/14/16 0245  07/15/16 0258  WBC 13.1* 9.8 7.5  RBC 3.15* 3.15* 3.16*  HGB 9.9* 9.7* 9.5*  HCT 30.6* 30.5* 30.1*  MCV 97.1 96.8 95.3  MCH 31.4 30.8 30.1  MCHC 32.4 31.8 31.6  RDW 15.0 15.1 14.7  PLT 346 387 379    Cardiac Enzymes Recent Labs Lab 07/10/16 2340 07/11/16 0414 07/11/16 0926  TROPONINI 0.03* 0.04* 0.06*    Recent Labs Lab 07/11/16 0005  TROPIPOC 0.02     BNP Recent Labs Lab 07/10/16 2340  BNP 361.3*     DDimer No results for input(s): DDIMER in the last 168 hours.    Radiology    No results found.  Cardiac Studies   Echo 07/11/16: Study Conclusions  - Left ventricle: There appears to be akinesis of the mid anteroseptal and apical anterior walls but likely due to LBBB. The cavity size was normal. There was moderate focal basal and mild concentric hypertrophy. Systolic function was normal. The estimated ejection fraction was in the range of 50% to 55%. Wall motion was normal; there were no regional wall motion abnormalities. - Ventricular septum: Septal motion showed severe dyssynergy. These changes are consistent with a left bundle branch block. - Aortic valve: Valve mobility was restricted. There  was mild regurgitation. Valve area (VTI): 1.9 cm^2. Valve area (Vmax): 1.85 cm^2. Valve area (Vmean): 1.78 cm^2. - Left atrium: The atrium was massively dilated. - Tricuspid valve: There was trivial regurgitation. - Pulmonic valve: There was mild regurgitation.   LHC: 07/14/16  Conclusion     Prox RCA lesion, 30 %stenosed.  Mid RCA-2 lesion, 30 %stenosed.  Mid RCA-1 lesion, 40 %stenosed.  Dist RCA lesion/PLOM, 100 %stenosed.  Ost RPDA to RPDA lesion, 50 %stenosed.  Ost Cx to Prox Cx lesion, 10 %stenosed.  Ost 1st Mrg to 1st Mrg lesion, 100 %stenosed.  Ost LAD to Mid LAD lesion, 20 %stenosed.  Acute Mrg lesion, 30 %stenosed.  Ost 2nd Mrg to 2nd Mrg lesion, 60 %stenosed.  Mid LAD lesion, 30 %stenosed.  LV end diastolic pressure is normal.  Mid Cx lesion, 90 %stenosed.  Post intervention, there is a 90% residual stenosis.   1. 2 vessel obstructive CAD    - 100% CTO of OM1 with left to left collaterals.    - 90% distal LCx    - 100% distal PLOM 2. Normal LVEDP 3. Unsuccessful PCI of the distal LCx due to inability to cross the lesion with a balloon.   Plan: will resume IV heparin this pm. Plan to return to cath lab for atherectomy and stenting of the distal LCx.    Patient Profile     83 y.o. female with complicated cardiac history including CAD with multiple stents, chronic combined systolic and diastolic heart failure, peripheral vascular disease, tachybradycardia syndrome, permanent atrial fibrillation, obstructive sleep apnea, who presented with recurring episodes of chest discomfort similar to prior episodes of unstable angina. In reviewing coronary anatomy she has ostial LAD disease with prior stenting.  Assessment & Plan    1. NSTEMI: Presented with 2 weeks of worsening exertional dyspnea and chest pain. Under recent personal stress. Trop peaked at 0.10. EKG with known LBBB. Echo this admission showed 50-55% EF with no new WMA.  -- underwent LHC  yesterday with Dr. Martinique noted above with unsuccessful PCI top the distal LCx. Planned for atherectomy today with stenting of Lcx.   2. Permanent Afib/flutter: Coumadin held--> on IV heparin for repeat cath today. INR 1.6 this morning. Rate controlled with oral  Dilt.   3. Chronic combined HF: Appears stable on exam, able to lay flat in the bed.   4. Tachy-brady syndrome s/p leadless PPM: Battery known to be depleted. Currently just following at this time. No brady episodes of pauses noted.   5. Hypokalemia: Replete this morning.   Signed, Reino Bellis, NP  07/15/2016, 7:58 AM    Patient seen and examined. Agree with assessment and plan. Angios reviewed.  Unable to cross distal LCX stenosis with balloon; plan for atherectomy today. K 3.2; need to replete;  Cr 0.960.  Anemic with H/H 9.5/30.1  No recurrent chest pain. AF in the 90's on heparin.   Troy Sine, MD, [facility] 07/15/2016 8:52 AM

## 2016-07-15 NOTE — Progress Notes (Signed)
TR BAND REMOVAL  LOCATION:    right radial  DEFLATED PER PROTOCOL:    Yes.    TIME BAND OFF / DRESSING APPLIED:    0045   SITE UPON ARRIVAL:    Level 1  SITE AFTER BAND REMOVAL:    Level 1  CIRCULATION SENSATION AND MOVEMENT:    Within Normal Limits   Yes.    COMMENTS:   Radial site teaching done. Teach back done.

## 2016-07-15 NOTE — H&P (View-Only) (Signed)
Progress Note  Patient Name: Patty [name] Date of Encounter: 07/15/2016  Primary Cardiologist: Irish Lack  Subjective   No chest pain this morning.   Inpatient Medications    Scheduled Meds: . aspirin  81 mg Oral Daily  . atorvastatin  80 mg Oral q morning - 10a  . clopidogrel  75 mg Oral Daily  . diltiazem  120 mg Oral QHS  . diltiazem  240 mg Oral Daily  . furosemide  80 mg Oral Daily  . isosorbide mononitrate  60 mg Oral Daily  . metoprolol tartrate  100 mg Oral BID  . sodium chloride flush  3 mL Intravenous Q12H   Continuous Infusions: . sodium chloride    . heparin 1,050 Units/hr (07/15/16 0230)   PRN Meds: sodium chloride, acetaminophen, metoprolol tartrate, nitroGLYCERIN, ondansetron (ZOFRAN) IV, sodium chloride flush   Vital Signs    Vitals:   07/14/16 2100 07/14/16 2200 07/14/16 2300 07/15/16 0416  BP: (!) 130/52  124/60 (!) 153/59  Pulse:  95  75  Resp: 15 (!) 28 16 (!) 21  Temp:    97.9 F (36.6 C)  TempSrc:    Oral  SpO2:  97%  96%  Weight:    170 lb 6.7 oz (77.3 kg)  Height:        Intake/Output Summary (Last 24 hours) at 07/15/16 0758 Last data filed at 07/15/16 0500  Gross per 24 hour  Intake          1962.06 ml  Output             1001 ml  Net           961.06 ml   Filed Weights   07/11/16 0334 07/14/16 0835 07/15/16 0416  Weight: 167 lb 15.9 oz (76.2 kg) 171 lb 3.2 oz (77.7 kg) 170 lb 6.7 oz (77.3 kg)    Telemetry    Afib Rate controlled - Personally Reviewed  ECG    N/a - Personally Reviewed  Physical Exam   General: Well developed, well nourished, female appearing in no acute distress. Head: Normocephalic, atraumatic.  Neck: Supple without bruits, JVD. Lungs:  Resp regular and unlabored, CTA. Heart: Irreg Irreg, S1, S2, no S3, S4, or murmur; no rub. Abdomen: Soft, non-tender, non-distended with normoactive bowel sounds. No hepatomegaly. No rebound/guarding. No obvious abdominal masses. Extremities: No clubbing, cyanosis,  edema. Distal pedal pulses are 2+ bilaterally. Right radial cath site stable.  Neuro: Alert and oriented X 3. Moves all extremities spontaneously. Psych: Normal affect.  Labs    Chemistry Recent Labs Lab 07/12/16 0236 07/13/16 0416 07/14/16 0450 07/15/16 0258  NA 138 137 139 137  K 3.9 3.2* 3.9 3.2*  CL 102 100* 104 104  CO2 28 30 28 26   GLUCOSE 124* 112* 109* 105*  BUN 17 19 17 15   CREATININE 1.07* 1.06* 1.02* 0.96  CALCIUM 8.0* 8.0* 8.0* 7.8*  PROT 5.7*  --   --   --   ALBUMIN 2.5*  --   --   --   AST 40  --   --   --   ALT 29  --   --   --   ALKPHOS 100  --   --   --   BILITOT 1.3*  --   --   --   GFRNONAA 47* 47* 49* 53*  GFRAA 54* 55* 57* >60  ANIONGAP 8 7 7 7      Hematology Recent Labs Lab 07/13/16 0416 07/14/16 0245  07/15/16 0258  WBC 13.1* 9.8 7.5  RBC 3.15* 3.15* 3.16*  HGB 9.9* 9.7* 9.5*  HCT 30.6* 30.5* 30.1*  MCV 97.1 96.8 95.3  MCH 31.4 30.8 30.1  MCHC 32.4 31.8 31.6  RDW 15.0 15.1 14.7  PLT 346 387 379    Cardiac Enzymes Recent Labs Lab 07/10/16 2340 07/11/16 0414 07/11/16 0926  TROPONINI 0.03* 0.04* 0.06*    Recent Labs Lab 07/11/16 0005  TROPIPOC 0.02     BNP Recent Labs Lab 07/10/16 2340  BNP 361.3*     DDimer No results for input(s): DDIMER in the last 168 hours.    Radiology    No results found.  Cardiac Studies   Echo 07/11/16: Study Conclusions  - Left ventricle: There appears to be akinesis of the mid anteroseptal and apical anterior walls but likely due to LBBB. The cavity size was normal. There was moderate focal basal and mild concentric hypertrophy. Systolic function was normal. The estimated ejection fraction was in the range of 50% to 55%. Wall motion was normal; there were no regional wall motion abnormalities. - Ventricular septum: Septal motion showed severe dyssynergy. These changes are consistent with a left bundle branch block. - Aortic valve: Valve mobility was restricted. There  was mild regurgitation. Valve area (VTI): 1.9 cm^2. Valve area (Vmax): 1.85 cm^2. Valve area (Vmean): 1.78 cm^2. - Left atrium: The atrium was massively dilated. - Tricuspid valve: There was trivial regurgitation. - Pulmonic valve: There was mild regurgitation.   LHC: 07/14/16  Conclusion     Prox RCA lesion, 30 %stenosed.  Mid RCA-2 lesion, 30 %stenosed.  Mid RCA-1 lesion, 40 %stenosed.  Dist RCA lesion/PLOM, 100 %stenosed.  Ost RPDA to RPDA lesion, 50 %stenosed.  Ost Cx to Prox Cx lesion, 10 %stenosed.  Ost 1st Mrg to 1st Mrg lesion, 100 %stenosed.  Ost LAD to Mid LAD lesion, 20 %stenosed.  Acute Mrg lesion, 30 %stenosed.  Ost 2nd Mrg to 2nd Mrg lesion, 60 %stenosed.  Mid LAD lesion, 30 %stenosed.  LV end diastolic pressure is normal.  Mid Cx lesion, 90 %stenosed.  Post intervention, there is a 90% residual stenosis.   1. 2 vessel obstructive CAD    - 100% CTO of OM1 with left to left collaterals.    - 90% distal LCx    - 100% distal PLOM 2. Normal LVEDP 3. Unsuccessful PCI of the distal LCx due to inability to cross the lesion with a balloon.   Plan: will resume IV heparin this pm. Plan to return to cath lab for atherectomy and stenting of the distal LCx.    Patient Profile     83 y.o. female with complicated cardiac history including CAD with multiple stents, chronic combined systolic and diastolic heart failure, peripheral vascular disease, tachybradycardia syndrome, permanent atrial fibrillation, obstructive sleep apnea, who presented with recurring episodes of chest discomfort similar to prior episodes of unstable angina. In reviewing coronary anatomy she has ostial LAD disease with prior stenting.  Assessment & Plan    1. NSTEMI: Presented with 2 weeks of worsening exertional dyspnea and chest pain. Under recent personal stress. Trop peaked at 0.10. EKG with known LBBB. Echo this admission showed 50-55% EF with no new WMA.  -- underwent LHC  yesterday with Dr. Martinique noted above with unsuccessful PCI top the distal LCx. Planned for atherectomy today with stenting of Lcx.   2. Permanent Afib/flutter: Coumadin held--> on IV heparin for repeat cath today. INR 1.6 this morning. Rate controlled with oral  Dilt.   3. Chronic combined HF: Appears stable on exam, able to lay flat in the bed.   4. Tachy-brady syndrome s/p leadless PPM: Battery known to be depleted. Currently just following at this time. No brady episodes of pauses noted.   5. Hypokalemia: Replete this morning.   Signed, Reino Bellis, NP  07/15/2016, 7:58 AM    Patient seen and examined. Agree with assessment and plan. Angios reviewed.  Unable to cross distal LCX stenosis with balloon; plan for atherectomy today. K 3.2; need to replete;  Cr 0.960.  Anemic with H/H 9.5/30.1  No recurrent chest pain. AF in the 90's on heparin.   Troy Sine, MD, [facility] 07/15/2016 8:52 AM

## 2016-07-15 NOTE — Consult Note (Signed)
            [facility] CM Primary Care Navigator  07/15/2016  Morgyn Marut [place] 1930-02-22 [id number]   Went back to see patientat the bedside to identify possible discharge needsbut RN reports that patient is back in cath lab for atherectomy and stenting this time.  Will attempt to see patient again at another time when available in the room.  For additional questions please contact:  Edwena Felty A. Zylee Marchiano, BSN, RN-BC [facility] PRIMARY CARE Navigator Cell: [phone]

## 2016-07-15 NOTE — Care Management Note (Addendum)
Case Management Note  Patient Details  Name: Patty [name] MRN: [id number] Date of Birth: 05/31/1930  Subjective/Objective:   From home alone, s/p  Unsuccessful PCI of the distal LCx due to inability to cross the lesion with a balloon, per Cath note will resume IV heparin ,plan to returnt to cath lab for atherectomy and stenting of the distal  LCX, on plavix.  6/12 [name] ,BSN Successful orbital atherectomy and stenting of the distal LCx with DES, will be on plavix and asa. -   PCP   [name].               Action/Plan: NCM will follow for dc needs.   Expected Discharge Date:                  Expected Discharge Plan:     In-House Referral:     Discharge planning Services  CM Consult  Post Acute Care Choice:    Choice offered to:     DME Arranged:    DME Agency:     HH Arranged:    HH Agency:     Status of Service:  In process, will continue to follow  If discussed at Long Length of Stay Meetings, dates discussed:    Additional Comments:  Zenon Mayo, RN 07/15/2016, 8:18 AM

## 2016-07-16 ENCOUNTER — Encounter (HOSPITAL_COMMUNITY): Payer: Self-pay | Admitting: Cardiology

## 2016-07-16 ENCOUNTER — Other Ambulatory Visit: Payer: Self-pay

## 2016-07-16 DIAGNOSIS — I4819 Other persistent atrial fibrillation: Secondary | ICD-10-CM

## 2016-07-16 DIAGNOSIS — Z7901 Long term (current) use of anticoagulants: Secondary | ICD-10-CM

## 2016-07-16 DIAGNOSIS — I481 Persistent atrial fibrillation: Secondary | ICD-10-CM

## 2016-07-16 DIAGNOSIS — Z9989 Dependence on other enabling machines and devices: Secondary | ICD-10-CM

## 2016-07-16 DIAGNOSIS — R0609 Other forms of dyspnea: Secondary | ICD-10-CM

## 2016-07-16 DIAGNOSIS — I5040 Unspecified combined systolic (congestive) and diastolic (congestive) heart failure: Secondary | ICD-10-CM

## 2016-07-16 DIAGNOSIS — G4733 Obstructive sleep apnea (adult) (pediatric): Secondary | ICD-10-CM

## 2016-07-16 DIAGNOSIS — Z955 Presence of coronary angioplasty implant and graft: Secondary | ICD-10-CM

## 2016-07-16 DIAGNOSIS — R0600 Dyspnea, unspecified: Secondary | ICD-10-CM

## 2016-07-16 LAB — BASIC METABOLIC PANEL
ANION GAP: 8 (ref 5–15)
BUN: 15 mg/dL (ref 6–20)
CALCIUM: 7.9 mg/dL — AB (ref 8.9–10.3)
CO2: 22 mmol/L (ref 22–32)
Chloride: 107 mmol/L (ref 101–111)
Creatinine, Ser: 1.01 mg/dL — ABNORMAL HIGH (ref 0.44–1.00)
GFR calc Af Amer: 58 mL/min — ABNORMAL LOW (ref >60)
GFR calc non Af Amer: 50 mL/min — ABNORMAL LOW (ref >60)
GLUCOSE: 132 mg/dL — AB (ref 65–99)
POTASSIUM: 3.8 mmol/L (ref 3.5–5.1)
Sodium: 137 mmol/L (ref 135–145)

## 2016-07-16 LAB — CBC
HCT: 30.1 % — ABNORMAL LOW (ref 36.0–46.0)
Hemoglobin: 9.4 g/dL — ABNORMAL LOW (ref 12.0–15.0)
MCH: 30.2 pg (ref 26.0–34.0)
MCHC: 31.2 g/dL (ref 30.0–36.0)
MCV: 96.8 fL (ref 78.0–100.0)
Platelets: 350 10*3/uL (ref 150–400)
RBC: 3.11 MIL/uL — ABNORMAL LOW (ref 3.87–5.11)
RDW: 14.9 % (ref 11.5–15.5)
WBC: 7.7 10*3/uL (ref 4.0–10.5)

## 2016-07-16 LAB — PROTIME-INR
INR: 1.29
Prothrombin Time: 16.2 seconds — ABNORMAL HIGH (ref 11.4–15.2)

## 2016-07-16 MED ORDER — DILTIAZEM HCL ER COATED BEADS 240 MG PO CP24: 240.0000 mg | ORAL_CAPSULE | Freq: Every day | ORAL | 1 refills | Status: DC

## 2016-07-16 MED ORDER — ASPIRIN 81 MG PO CHEW: 81.0000 mg | CHEWABLE_TABLET | Freq: Every day | ORAL | 0 refills | Status: DC

## 2016-07-16 MED ORDER — METOPROLOL TARTRATE 25 MG PO TABS
125.0000 mg | ORAL_TABLET | Freq: Two times a day (BID) | ORAL | Status: DC
  Administered 2016-07-16: 125 mg via ORAL
  Filled 2016-07-16: qty 5

## 2016-07-16 MED ORDER — METOPROLOL TARTRATE 25 MG PO TABS: 25.0000 mg | ORAL_TABLET | Freq: Two times a day (BID) | ORAL | 1 refills | Status: DC

## 2016-07-16 NOTE — Care Management Important Message (Signed)
Important Message  Patient Details  Name: Patty [name] MRN: [id number] Date of Birth: 1930/09/16   Medicare Important Message Given:  Yes    Sabah Zucco Montine Circle 07/16/2016, 11:19 AM

## 2016-07-16 NOTE — Discharge Summary (Signed)
Physician Discharge Summary  Patty [name] CBS:[id number] DOB: [birth year] DOA: 07/10/2016  PCP: [name], MD  Admit date: 07/10/2016 Discharge date: 07/16/2016  Time spent: 35 minutes  Recommendations for Outpatient Follow-up:  1. Repeat BMET to follow electrolytes and renal function  2. Reassess BP and adjust medications as needed 3. Aspirin to be discontinued after 1 month of therapy; continue coumadin and plavix after that.   Discharge Diagnoses:  Principal Problem:   Unstable angina ([name]) Active Problems:   CAD (coronary artery disease)   Essential hypertension   Combined systolic and diastolic heart failure, NYHA class 3 ([name])   Pacemaker - St Jude Leadless PPM   Chest pain   Atrial flutter ([name])   Hypokalemia   OSA on CPAP   NSTEMI (non-ST elevated myocardial infarction) ([name])   HLD   Persistent atrial fibrillation ([name])   Warfarin anticoagulation   DOE (dyspnea on exertion)   Status post coronary artery stent placement   Discharge Condition: stable and improved. Discharge home with instructions to follow up with cardiology service as instructed and with PCP in 10 days.  Diet recommendation: heart healthy diet   Filed Weights   07/11/16 0334 07/14/16 0835 07/15/16 0416  Weight: 76.2 kg (167 lb 15.9 oz) 77.7 kg (171 lb 3.2 oz) 77.3 kg (170 lb 6.7 oz)    History of present illness:  As per H&P written by Dr. Loleta Books on 07/10/16 83 y.o. female with a past medical history significant for CAD s/p PCI, Afib on warfarin, CHF EF 45%, tachy-brady s/p PPM battery now dead, and HTN who presents with 2 weeks progressive exertional dyspnea and chest pain The patient was in her usual state of health until about the last month when she has been more fatigued/tired than usual.  Then in the last week, she had an episode of chest pain with exertion that resolved with nitro, first time she had had chest pain in months. Through the week however, she noticed she was progressively  more SOB with exertion, and felt like a "brick on my chest".  Saw her PCP earlier in the week, didn't complain of CP to him.  But by today, was feeling SOB and chest tightness with bringing in groceries or walking to bring the trash cans in.  Tonight after dinner she watched TV and when she got up to go to bed around 10PM, she felt SOB and chest tightness with exertion.  She sat down and took a nitro and it eased somewhat but did not stop so she took another nitro.  It did not ease, so she called 9-1-1.  Hospital Course:  1-NSTEMI/unstable angina/DOE: -peaked troponin 0.10 -patient status post left heart cath, left circumflex stenting and atherectomy -patient discharge on plavix, aspirin and warfarin for the next month; after one month of therapy plan is to stop ASA and continue just plavix and warfarin. -patient advise to continue statins and to follow up with cardiology service after discharge -no CP or SOB at discharge.  2-combined systolic and diastolic HF -stable and compensated -no crackles, no JVD and able to lay down on flat -advise to continue low sodium heart healthy diet and daily weights -patient will continue b-blocker, Imdur and  Lasix -patient will follow up with cardiology as instructed  3-HTN -stable and well controlled now -medication has been adjusted as per cardiology rec's  4-Atrial Flutter/PAF: with hx of tachy-brady syndrome -Will continue diltiazem and metoprolol dose adjusted as per cardiology rec's -patient with prior hx of  pacemaker in place (but battery is off now) -had appointment next Monday with Dr. Ellender Hose   5-OSA: -will continue CPAP QHS  6-hypokalemia: -repleted and WNL at discharge -will recommend BMET at follow up visit to follow electrolytes  7-HLD -continue statins   Procedures:  2-D echo - Left ventricle: There appears to be akinesis of the mid   anteroseptal and apical anterior walls but likely due to LBBB.   The cavity size was normal.  There was moderate focal basal and   mild concentric hypertrophy. Systolic function was normal. The   estimated ejection fraction was in the range of 50% to 55%. Wall   motion was normal; there were no regional wall motion   abnormalities. - Ventricular septum: Septal motion showed severe dyssynergy. These   changes are consistent with a left bundle branch block. - Aortic valve: Valve mobility was restricted. There was mild   regurgitation. Valve area (VTI): 1.9 cm^2. Valve area (Vmax):   1.85 cm^2. Valve area (Vmean): 1.78 cm^2. - Left atrium: The atrium was massively dilated. - Tricuspid valve: There was trivial regurgitation. - Pulmonic valve: There was mild regurgitation.   Cardiac cath and PCI: with coronary stent and atherectomy 07/15/16 1. Mid Cx lesion, 90 %stenosed. 2. A STENT PROMUS PREM MR 2.5X12 drug eluting stent was successfully placed. 3. Post intervention, there is a 0% residual stenosis. 4. Successful orbital atherectomy and stenting of the distal LCx with DES  Consultations:  Cardiology service   Discharge Exam: Vitals:   07/16/16 0345 07/16/16 0730  BP: (!) 142/90 (!) 152/55  Pulse: 70 71  Resp: (!) 21 19  Temp: 98.1 F (36.7 C) 97.5 F (36.4 C)    General:afebrile, no CP, denies SOB and reports feeling much better. No nausea, no vomiting.  Cardiovascular: regular rate, no rubs, no gallops, S1 and S2 heard on exam; no JVD Respiratory: good air movement, no wheezing, no crackles Abd: soft, NT, ND, positive BS Extremities: no edema, no cyanosis or clubbing  Neuro:no focal deficit, AAOX3  Discharge Instructions   Discharge Instructions    (HEART FAILURE PATIENTS) Call MD:  Anytime you have any of the following symptoms: 1) 3 pound weight gain in 24 hours or 5 pounds in 1 week 2) shortness of breath, with or without a dry hacking cough 3) swelling in the hands, feet or stomach 4) if you have to sleep on extra pillows at night in order to breathe.     Complete by:  As directed    Amb Referral to Cardiac Rehabilitation    Complete by:  As directed    Diagnosis:   Coronary Stents NSTEMI     Diet - low sodium heart healthy    Complete by:  As directed    Discharge instructions    Complete by:  As directed    Maintain adequate hydration Take medications as prescribed Follow heart healthy diet and check your weight on daily basis Arrange follow up with PCP in 10 days Follow up with cardiology service as instructed     Current Discharge Medication List    START taking these medications   Details  aspirin 81 MG chewable tablet Chew 1 tablet (81 mg total) by mouth daily. Qty: 30 tablet, Refills: 0      CONTINUE these medications which have CHANGED   Details  !! diltiazem (CARDIZEM CD) 240 MG 24 hr capsule Take 1 capsule (240 mg total) by mouth daily. With breakfast Qty: 30 capsule, Refills: 1    !!  metoprolol tartrate (LOPRESSOR) 25 MG tablet Take 1 tablet (25 mg total) by mouth 2 (two) times daily. Please take it a long with 100mg  tablet, to make dose 125mg  twice a day Qty: 60 tablet, Refills: 1     !! - Potential duplicate medications found. Please discuss with provider.    CONTINUE these medications which have NOT CHANGED   Details  atorvastatin (LIPITOR) 80 MG tablet TAKE 1 TABLET BY MOUTH EVERY MORNING Qty: 30 tablet, Refills: 9    beta carotene w/minerals (OCUVITE) tablet Take 1 tablet by mouth daily.     Calcium Carb-Cholecalciferol (CALTRATE 600+D) 600-800 MG-UNIT TABS Take 1 tablet by mouth every evening.     Coenzyme Q-10 100 MG capsule Take 200 mg by mouth daily.     !! diltiazem (CARDIZEM CD) 120 MG 24 hr capsule Take 1 capsule (120 mg total) by mouth at bedtime. Qty: 30 capsule, Refills: 6    furosemide (LASIX) 40 MG tablet Take 80 mg by mouth daily.    isosorbide mononitrate (IMDUR) 60 MG 24 hr tablet TAKE 1 TABLET (60 MG TOTAL) BY MOUTH DAILY. Qty: 30 tablet, Refills: 9    KLOR-CON M20 20 MEQ tablet  TAKE 1 TABLET (20 MEQ TOTAL) BY MOUTH DAILY. Qty: 30 tablet, Refills: 9    !! metoprolol (LOPRESSOR) 100 MG tablet TAKE 1 TABLET (100 MG TOTAL) BY MOUTH 2 (TWO) TIMES DAILY. Qty: 60 tablet, Refills: 11    Multiple Vitamin (MULTIVITAMIN) capsule Take 1 capsule by mouth daily.    nitroGLYCERIN (NITROSTAT) 0.4 MG SL tablet Place 0.4 mg under the tongue every 5 (five) minutes as needed for chest pain. 3 dose max    Omega-3 Fatty Acids (FISH OIL PO) Take 1,200 mg by mouth daily.     spironolactone (ALDACTONE) 25 MG tablet Take 25 mg by mouth daily as needed (fluid).  Qty: 45 tablet, Refills: 11    warfarin (COUMADIN) 2 MG tablet TAKE AS DIRECTED BY COUMADIN CLINIC Qty: 30 tablet, Refills: 3    clopidogrel (PLAVIX) 75 MG tablet TAKE 1 TABLET BY MOUTH EVERY DAY WITH BREAKFAST Qty: 30 tablet, Refills: 5     !! - Potential duplicate medications found. Please discuss with provider.     Allergies  Allergen Reactions  . Bee Venom Anaphylaxis  . Other Nausea And Vomiting and Other (See Comments)    Pain medications cause severe vomiting. Tolerated slow IV morphine drip   Follow-up Information    Charlie Pitter, PA-C Follow up on 07/31/2016.   Specialties:  Cardiology, Radiology Why:  at 1:30pm for your follow up appt Contact information: [address] [place] [address] [phone]        [name], MD. Schedule an appointment as soon as possible for a visit in 69 day(s).   Specialty:  Internal Medicine Contact information: [address] [place] [address] [phone]            The results of significant diagnostics from this hospitalization (including imaging, microbiology, ancillary and laboratory) are listed below for reference.    Significant Diagnostic Studies: Dg Chest 2 View  Result Date: 07/10/2016 CLINICAL DATA:  Substernal chest pain. EXAM: CHEST  2 VIEW COMPARISON:  12/13/2015 FINDINGS: Stable cardiomegaly and  atherosclerosis of the thoracic aorta. Implanted loop recorder projects over the left chest. No evidence of pulmonary edema, pleural effusion, focal airspace disease or pneumothorax. Stable osseous structures. IMPRESSION: Stable cardiomegaly and atherosclerosis of the thoracic aorta. No  evidence of congestive failure or acute abnormality. Electronically Signed   By: Jeb Levering M.D.   On: 07/10/2016 23:18    Microbiology: Recent Results (from the past 240 hour(s))  MRSA PCR Screening     Status: None   Collection Time: 07/11/16  5:17 AM  Result Value Ref Range Status   MRSA by PCR NEGATIVE NEGATIVE Final    Comment:        The GeneXpert MRSA Assay (FDA approved for NASAL specimens only), is one component of a comprehensive MRSA colonization surveillance program. It is not intended to diagnose MRSA infection nor to guide or monitor treatment for MRSA infections.      Labs: Basic Metabolic Panel:  Recent Labs Lab 07/11/16 0414  07/12/16 0236 07/13/16 0416 07/14/16 0450 07/15/16 0258 07/16/16 0314  NA  --   < > 138 137 139 137 137  K  --   < > 3.9 3.2* 3.9 3.2* 3.8  CL  --   < > 102 100* 104 104 107  CO2  --   < > 28 30 28 26 22   GLUCOSE  --   < > 124* 112* 109* 105* 132*  BUN  --   < > 17 19 17 15 15   CREATININE  --   < > 1.07* 1.06* 1.02* 0.96 1.01*  CALCIUM  --   < > 8.0* 8.0* 8.0* 7.8* 7.9*  MG 1.9  --  1.9  --   --   --   --   < > = values in this interval not displayed. Liver Function Tests:  Recent Labs Lab 07/12/16 0236  AST 40  ALT 29  ALKPHOS 100  BILITOT 1.3*  PROT 5.7*  ALBUMIN 2.5*   CBC:  Recent Labs Lab 07/12/16 0236 07/13/16 0416 07/14/16 0245 07/15/16 0258 07/16/16 0314  WBC 15.7* 13.1* 9.8 7.5 7.7  NEUTROABS  --  10.7*  --   --   --   HGB 10.4* 9.9* 9.7* 9.5* 9.4*  HCT 32.2* 30.6* 30.5* 30.1* 30.1*  MCV 96.7 97.1 96.8 95.3 96.8  PLT 314 346 387 379 350   Cardiac Enzymes:  Recent Labs Lab 07/10/16 2340 07/11/16 0414  07/11/16 0926  TROPONINI 0.03* 0.04* 0.06*   BNP: BNP (last 3 results)  Recent Labs  07/10/16 2340  BNP 361.3*    Signed:  Barton Dubois MD.  [facility] 07/16/2016, 11:55 AM

## 2016-07-16 NOTE — Progress Notes (Signed)
CARDIAC REHAB PHASE I   PRE:  Rate/Rhythm: 88 a fib  BP:  Sitting: 153/78    MODE:  Ambulation: 350 ft   POST:  Rate/Rhythm: 110 a fib  BP:  Sitting: 192/70       Pt known to cardiac rehab, is currently in maintenance program. Pt ambulated 350 ft on RA, handheld assist, slow, mostly steady gait, tolerated fairly well.  Pt c/o mild DOE, mild chest pressure upon return to room, which quickly resolved with rest, standing rest x2. Completed MI/stent education with pt and daughter at bedside.  Reviewed risk factors, MI/PCI book, anti-platelet therapy, stent card, activity restrictions, ntg, exercise, heart healthy diet and phase 2 cardiac rehab. Pt verbalized understanding. Pt agrees to phase 2 cardiac rehab referral, will send to [facility]. Pt to recliner after walk, call bell within reach.   [phone] Lenna Sciara, RN, BSN 07/16/2016 10:00 AM

## 2016-07-16 NOTE — Progress Notes (Signed)
Progress Note  Patient Name: Patty [name] Date of Encounter: 07/16/2016  Primary Cardiologist: Irish Lack  Subjective   S/P CSI atherectomy to  Mid LCX. Yesterday.  Inpatient Medications    Scheduled Meds: . aspirin  81 mg Oral Daily  . atorvastatin  80 mg Oral q morning - 10a  . clopidogrel  75 mg Oral Daily  . diltiazem  120 mg Oral QHS  . diltiazem  240 mg Oral Daily  . furosemide  80 mg Oral Daily  . isosorbide mononitrate  60 mg Oral Daily  . metoprolol tartrate  100 mg Oral BID  . sodium chloride flush  3 mL Intravenous Q12H  . sodium chloride flush  3 mL Intravenous Q12H  . Warfarin - Pharmacist Dosing Inpatient   Does not apply q1800   Continuous Infusions: . sodium chloride    . sodium chloride Stopped (07/15/16 1429)  . sodium chloride     PRN Meds: sodium chloride, sodium chloride, acetaminophen, metoprolol tartrate, nitroGLYCERIN, ondansetron (ZOFRAN) IV, sodium chloride flush, sodium chloride flush   Vital Signs    Vitals:   07/15/16 2147 07/15/16 2205 07/16/16 0345 07/16/16 0730  BP: (!) 154/96 126/79 (!) 142/90 (!) 152/55  Pulse: 75 (!) 103 70 71  Resp: (!) 23 (!) 22 (!) 21 19  Temp:   98.1 F (36.7 C) 97.5 F (36.4 C)  TempSrc:   Oral Oral  SpO2: 95% 100% 96% 96%  Weight:      Height:        Intake/Output Summary (Last 24 hours) at 07/16/16 0839 Last data filed at 07/16/16 0830  Gross per 24 hour  Intake          1098.33 ml  Output             1325 ml  Net          -226.67 ml   Filed Weights   07/11/16 0334 07/14/16 0835 07/15/16 0416  Weight: 167 lb 15.9 oz (76.2 kg) 171 lb 3.2 oz (77.7 kg) 170 lb 6.7 oz (77.3 kg)    Telemetry    Afib Rate controlled - Personally Reviewed  ECG    ECG (independently read by me): AF 95; IVCD  Physical Exam   BP (!) 152/55 (BP Location: Left Arm)   Pulse 71   Temp 97.5 F (36.4 C) (Oral)   Resp 19   Ht 5' (1.524 m)   Wt 170 lb 6.7 oz (77.3 kg)   SpO2 96%   BMI 33.28 kg/m  General:  Alert, oriented, no distress.  Skin: normal turgor, no rashes, warm and dry HEENT: Normocephalic, atraumatic. Pupils equal round and reactive to light; sclera anicteric; extraocular muscles intact;  Nose without nasal septal hypertrophy Mouth/Parynx benign; Mallinpatti scale Neck: No JVD, no carotid bruits; normal carotid upstroke Lungs: clear to ausculatation and percussion; no wheezing or rales Chest wall: without tenderness to palpitation Heart: PMI not displaced, irregularly irregular c/w AF, s1 s2 normal, 1/6 systolic murmur, no diastolic murmur, no rubs, gallops, thrills, or heaves Abdomen: soft, nontender; no hepatosplenomehaly, BS+; abdominal aorta nontender and not dilated by palpation. Back: no CVA tenderness Pulses 2+; R radial site with mild eccymosis Musculoskeletal: full range of motion, normal strength, no joint deformities Extremities: no clubbing cyanosis or edema, Homan's sign negative  Neurologic: grossly nonfocal; Cranial nerves grossly wnl Psychologic: Normal mood and affect   Labs    Chemistry Recent Labs Lab 07/12/16 0236  07/14/16 0450 07/15/16 0258 07/16/16 2229  NA 138  < > 139 137 137  K 3.9  < > 3.9 3.2* 3.8  CL 102  < > 104 104 107  CO2 28  < > 28 26 22   GLUCOSE 124*  < > 109* 105* 132*  BUN 17  < > 17 15 15   CREATININE 1.07*  < > 1.02* 0.96 1.01*  CALCIUM 8.0*  < > 8.0* 7.8* 7.9*  PROT 5.7*  --   --   --   --   ALBUMIN 2.5*  --   --   --   --   AST 40  --   --   --   --   ALT 29  --   --   --   --   ALKPHOS 100  --   --   --   --   BILITOT 1.3*  --   --   --   --   GFRNONAA 47*  < > 49* 53* 50*  GFRAA 54*  < > 57* >60 58*  ANIONGAP 8  < > 7 7 8   < > = values in this interval not displayed.   Hematology  Recent Labs Lab 07/14/16 0245 07/15/16 0258 07/16/16 0314  WBC 9.8 7.5 7.7  RBC 3.15* 3.16* 3.11*  HGB 9.7* 9.5* 9.4*  HCT 30.5* 30.1* 30.1*  MCV 96.8 95.3 96.8  MCH 30.8 30.1 30.2  MCHC 31.8 31.6 31.2  RDW 15.1 14.7 14.9  PLT  387 379 350    Cardiac Enzymes  Recent Labs Lab 07/10/16 2340 07/11/16 0414 07/11/16 0926  TROPONINI 0.03* 0.04* 0.06*     Recent Labs Lab 07/11/16 0005  TROPIPOC 0.02     BNP  Recent Labs Lab 07/10/16 2340  BNP 361.3*     DDimer No results for input(s): DDIMER in the last 168 hours.   Lipid Panel     Component Value Date/Time   CHOL 107 07/11/2016 0926   CHOL 160 02/13/2016 0855   TRIG 46 07/11/2016 0926   HDL 38 (L) 07/11/2016 0926   HDL 68 02/13/2016 0855   CHOLHDL 2.8 07/11/2016 0926   VLDL 9 07/11/2016 0926   LDLCALC 60 07/11/2016 0926   LDLCALC 75 02/13/2016 0855      Radiology    No results found.  Cardiac Studies   Echo 07/11/16: Study Conclusions  - Left ventricle: There appears to be akinesis of the mid anteroseptal and apical anterior walls but likely due to LBBB. The cavity size was normal. There was moderate focal basal and mild concentric hypertrophy. Systolic function was normal. The estimated ejection fraction was in the range of 50% to 55%. Wall motion was normal; there were no regional wall motion abnormalities. - Ventricular septum: Septal motion showed severe dyssynergy. These changes are consistent with a left bundle branch block. - Aortic valve: Valve mobility was restricted. There was mild regurgitation. Valve area (VTI): 1.9 cm^2. Valve area (Vmax): 1.85 cm^2. Valve area (Vmean): 1.78 cm^2. - Left atrium: The atrium was massively dilated. - Tricuspid valve: There was trivial regurgitation. - Pulmonic valve: There was mild regurgitation.   LHC: 07/14/16  Conclusion     Prox RCA lesion, 30 %stenosed.  Mid RCA-2 lesion, 30 %stenosed.  Mid RCA-1 lesion, 40 %stenosed.  Dist RCA lesion/PLOM, 100 %stenosed.  Ost RPDA to RPDA lesion, 50 %stenosed.  Ost Cx to Prox Cx lesion, 10 %stenosed.  Ost 1st Mrg to 1st Mrg lesion, 100 %stenosed.  Ost LAD to Mid LAD  lesion, 20 %stenosed.  Acute Mrg lesion,  30 %stenosed.  Ost 2nd Mrg to 2nd Mrg lesion, 60 %stenosed.  Mid LAD lesion, 30 %stenosed.  LV end diastolic pressure is normal.  Mid Cx lesion, 90 %stenosed.  Post intervention, there is a 90% residual stenosis.   1. 2 vessel obstructive CAD    - 100% CTO of OM1 with left to left collaterals.    - 90% distal LCx    - 100% distal PLOM 2. Normal LVEDP 3. Unsuccessful PCI of the distal LCx due to inability to cross the lesion with a balloon.   Plan: will resume IV heparin this pm. Plan to return to cath lab for atherectomy and stenting of the distal LCx.    07/15/16 PCI Conclusion:     Mid Cx lesion, 90 %stenosed.  A STENT PROMUS PREM MR 2.5X12 drug eluting stent was successfully placed.  Post intervention, there is a 0% residual stenosis.   1. Successful orbital atherectomy and stenting of the distal LCx with DES  Plan: continue DAPT with ASA/Plavix. Would discontinue ASA at one month. Resume Coumadin tonight. I would not bridge with IV heparin. Anticipate DC in am.      Patient Profile     83 y.o. female with complicated cardiac history including CAD with multiple stents, chronic combined systolic and diastolic heart failure, peripheral vascular disease, tachybradycardia syndrome, permanent atrial fibrillation, obstructive sleep apnea, who presented with recurring episodes of chest discomfort similar to prior episodes of unstable angina. In reviewing coronary anatomy she has ostial LAD disease with prior stenting.  Assessment & Plan    1. NSTEMI: Presented with 2 weeks of worsening exertional dyspnea and chest pain. Under recent personal stress. Trop peaked at 0.10. EKG with known LBBB. Echo this admission showed 50-55% EF with no new WMA.  -- Cath and atherectomy to [facility] as above.  Feels well; no recurrent chest pain.  2. Permanent Afib/flutter: Coumadin was on hold held for cath and PCI; restarted last night.  AF rate still ~ 100; will increase metoprolol to 125 mg  bid; continue present dose diltiazem. On ASA/Plavix and coumadin with plan to dc ASA in one month.  3. Chronic combined HF: Appears stable on exam, able to lay flat in the bed.   4. Tachy-brady syndrome s/p leadless PPM: Battery known to be depleted. Currently just following at this time. No brady episodes of pauses noted. She has an appointment to see Dr. [name] next Monday.  5. Hypokalemia: Replete this morning.   OK from cardiac perspective for DC today.  Troy Sine, MD, [facility] 07/16/2016 8:39 AM

## 2016-07-17 ENCOUNTER — Other Ambulatory Visit: Payer: Self-pay

## 2016-07-18 ENCOUNTER — Telehealth (HOSPITAL_COMMUNITY): Payer: Self-pay

## 2016-07-18 NOTE — Telephone Encounter (Signed)
Patient insurances are active and benefits verified. Patient has Medicare A/B and [employer] supplement. Medicare A/B - no co-payment, deductible $183.00/$183.00 has been met, 20% co-insurance, no out of pocket, no pre-authorization and no limit. Passport/reference [phone].  [employer] supplement - no co-payment, no deductible, out of pocket $1750.00/$259.05 has been met, 20% co-insurance, no pre-authorization and no limit on visit. Passport/reference [phone].   Patient will be contacted and scheduled after their follow up appointment with the cardiologist on 07/31/16, upon review by [facility] RN navigator.

## 2016-07-21 ENCOUNTER — Ambulatory Visit (INDEPENDENT_AMBULATORY_CARE_PROVIDER_SITE_OTHER): Payer: Medicare Other | Admitting: Internal Medicine

## 2016-07-21 ENCOUNTER — Encounter: Payer: Self-pay | Admitting: Internal Medicine

## 2016-07-21 VITALS — BP 134/60 | HR 54 | Ht 60.0 in | Wt 172.2 lb

## 2016-07-21 DIAGNOSIS — Z950 Presence of cardiac pacemaker: Secondary | ICD-10-CM | POA: Diagnosis not present

## 2016-07-21 DIAGNOSIS — I495 Sick sinus syndrome: Secondary | ICD-10-CM | POA: Diagnosis not present

## 2016-07-21 DIAGNOSIS — I25118 Atherosclerotic heart disease of native coronary artery with other forms of angina pectoris: Secondary | ICD-10-CM

## 2016-07-21 DIAGNOSIS — I482 Chronic atrial fibrillation: Secondary | ICD-10-CM

## 2016-07-21 DIAGNOSIS — I4821 Permanent atrial fibrillation: Secondary | ICD-10-CM

## 2016-07-21 DIAGNOSIS — I200 Unstable angina: Secondary | ICD-10-CM

## 2016-07-21 DIAGNOSIS — I209 Angina pectoris, unspecified: Secondary | ICD-10-CM

## 2016-07-21 NOTE — Patient Instructions (Addendum)
Medication Instructions:  Your physician recommends that you continue on your current medications as directed. Please refer to the Current Medication list given to you today.  Labwork: None ordered   Testing/Procedures: None ordered   Follow-Up: Your physician recommends that you schedule a follow-up appointment in: 4 months with Dr. Thompson Grayer.   Any Other Special Instructions Will Be Listed Below (If Applicable).     If you need a refill on your cardiac medications before your next appointment, please call your pharmacy.

## 2016-07-21 NOTE — Progress Notes (Signed)
PCP: [name], MD Primary Cardiologist: Dr Armandina Stammer Patty [name] is a 83 y.o. female who presents today for routine electrophysiology followup.  Since last being seen in our clinic, the patient reports doing reasonably well.  She recently underwent PCI for ACS.  Doing better now.  Today, she denies symptoms of palpitations, chest pain, shortness of breath,  lower extremity edema, dizziness, presyncope, or syncope.  The patient is otherwise without complaint today.   Past Medical History:  Diagnosis Date  . Chronic anticoagulation - coumadin, CHADS2VASC=6 05/17/2015  . Combined systolic and diastolic heart failure, NYHA class 3 ([place])   . Coronary artery disease    a. s/p multiple stents; b. [facility] 12/14/13 with sig CAD with TO of OM1, 70-80% occusion OM2, 50-70% mLCx, 50-70% pLAD (recent FFR 0.83), diffusely diseased RCA, widely patent stetns in pLAD and LCx; c. [facility] 12/14/15 w/ 3v CAD, patent LAD & LCx stents, OM2 75% small & unchanged, RCA diff 30-40 w/o obs dz, med Rx  . Diverticulitis   . Granulosa cell carcinoma ([place])    abd; last episode was in 2009  . Hyperlipidemia   . Hypertension   . LBBB (left bundle branch block)       . Myocardial infarction ([place]) 2002  . Obesity   . OSA on CPAP   . Pacemaker - St Jude Leadless PPM   . Peripheral vascular disease ([place])   . Permanent atrial fibrillation ([place]) 2013  . Renal artery stenosis ([place])   . Tachycardia-bradycardia syndrome ([place])    a. s/p leadless pacemaker ([name]) implanted by Dr [name]  . Varicose veins    Past Surgical History:  Procedure Laterality Date  . ABDOMINAL HYSTERECTOMY    . CARDIAC CATHETERIZATION  09/03/2007   EF 70%; Failed attempt at PCI to OM  . CARDIAC CATHETERIZATION  11/01/2003   EF 70%  . CARDIAC CATHETERIZATION N/A 12/14/2015   Procedure: Left Heart Cath and Coronary Angiography;  Surgeon: Burnell Blanks, MD;  Location: [place] CV LAB;  Service: Cardiovascular;  Laterality: N/A;  .  CARDIOVERSION  12/31/2011   Procedure: CARDIOVERSION;  Surgeon: [name], MD;  Location: [facility] ENDOSCOPY;  Service: Cardiovascular;  Laterality: N/A;  . CARDIOVERSION N/A 12/31/2011   Procedure: CARDIOVERSION;  Surgeon: [name], MD;  Location: [facility] CATH LAB;  Service: Cardiovascular;  Laterality: N/A;  . CATARACT EXTRACTION, BILATERAL  2015  . CHOLECYSTECTOMY  1980's  . COLON SURGERY  2004   colectomy for diverticulosis  . CORONARY ANGIOPLASTY WITH STENT PLACEMENT  2000; 08/11/2012; 11/12/2012   3 + 2 LAD & CFX; 2nd CFX stent 11/12/2012  . CORONARY ATHERECTOMY N/A 07/15/2016   Procedure: Coronary Atherectomy;  Surgeon: Martinique, Peter M, MD;  Location: [place] CV LAB;  Service: Cardiovascular;  Laterality: N/A;  . CORONARY BALLOON ANGIOPLASTY N/A 07/14/2016   Procedure: Coronary Balloon Angioplasty;  Surgeon: Martinique, Peter M, MD;  Location: [place] CV LAB;  Service: Cardiovascular;  Laterality: N/A;  . CORONARY STENT INTERVENTION N/A 07/15/2016   Procedure: Coronary Stent Intervention;  Surgeon: Martinique, Peter M, MD;  Location: [place] CV LAB;  Service: Cardiovascular;  Laterality: N/A;  . FRACTIONAL FLOW RESERVE WIRE  10/07/2013   Procedure: [place];  Surgeon: [name], MD;  Location: [facility] CATH LAB;  Service: Cardiovascular;;  . granulosa tumor excision  2000; 2003; 2004; 2007   "all in my abdomen including small intestines, outside my ?uterus/etc" (08/11/2012)  . HERNIA REPAIR  2005   "laparoscopic"  . LEFT HEART CATH AND CORONARY ANGIOGRAPHY N/A 07/14/2016   Procedure: Left Heart Cath and Coronary Angiography;  Surgeon: Martinique, Peter M, MD;  Location: [place] CV LAB;  Service: Cardiovascular;  Laterality: N/A;  . LEFT HEART CATHETERIZATION WITH CORONARY ANGIOGRAM N/A 11/12/2012   Procedure: LEFT HEART CATHETERIZATION WITH CORONARY ANGIOGRAM;  Surgeon: [name], MD;  Location: [facility] CATH LAB;  Service: Cardiovascular;  Laterality:  N/A;  . LEFT HEART CATHETERIZATION WITH CORONARY ANGIOGRAM N/A 10/07/2013   Procedure: LEFT HEART CATHETERIZATION WITH CORONARY ANGIOGRAM;  Surgeon: [name], MD;  Location: [facility] CATH LAB;  Service: Cardiovascular;  Laterality: N/A;  . LEFT HEART CATHETERIZATION WITH CORONARY ANGIOGRAM N/A 12/14/2013   Procedure: LEFT HEART CATHETERIZATION WITH CORONARY ANGIOGRAM;  Surgeon: Sinclair Grooms, MD;  Location: [facility] CATH LAB;  Service: Cardiovascular;  Laterality: N/A;  . LEFT HEART CATHETERIZATION WITH CORONARY ANGIOGRAM N/A 05/16/2014   Procedure: LEFT HEART CATHETERIZATION WITH CORONARY ANGIOGRAM;  Surgeon: Sherren Mocha, MD;  Location: [facility] CATH LAB;  Service: Cardiovascular;  Laterality: N/A;  . PERCUTANEOUS CORONARY INTERVENTION-BALLOON ONLY  08/04/2012   Procedure: PERCUTANEOUS CORONARY INTERVENTION-BALLOON ONLY;  Surgeon: [name], MD;  Location: [facility] CATH LAB;  Service: Cardiovascular;;  . PERCUTANEOUS CORONARY ROTOBLATOR INTERVENTION (PCI-R) N/A 08/11/2012   Procedure: PERCUTANEOUS CORONARY ROTOBLATOR INTERVENTION (PCI-R);  Surgeon: [name], MD;  Location: [facility] CATH LAB;  Service: Cardiovascular;  Laterality: N/A;  . PERCUTANEOUS CORONARY STENT INTERVENTION (PCI-S)  10/07/2013   Procedure: PERCUTANEOUS CORONARY STENT INTERVENTION (PCI-S);  Surgeon: [name], MD;  Location: [facility] CATH LAB;  Service: Cardiovascular;;  . PERMANENT PACEMAKER INSERTION N/A 03/16/2012   [name] (SJM) leadless pacemaker (LEADLESS II STUDY PATEINT)  . SALIVARY GLAND SURGERY  2000's   "had a little lump removed; granulosa related; it was benign" (08/11/2012)  . TEE WITHOUT CARDIOVERSION  12/31/2011   Procedure: TRANSESOPHAGEAL ECHOCARDIOGRAM (TEE);  Surgeon: [name], MD;  Location: [place];  Service: Cardiovascular;  Laterality: N/A;  . VARICOSE VEIN SURGERY Bilateral [place] are reviewed and negatives except as per HPI above  Current Outpatient  Prescriptions  Medication Sig Dispense Refill  . aspirin 81 MG chewable tablet Chew 1 tablet (81 mg total) by mouth daily. 30 tablet 0  . atorvastatin (LIPITOR) 80 MG tablet TAKE 1 TABLET BY MOUTH EVERY MORNING 30 tablet 9  . beta carotene w/minerals (OCUVITE) tablet Take 1 tablet by mouth daily.     . Calcium Carb-Cholecalciferol (CALTRATE 600+D) 600-800 MG-UNIT TABS Take 1 tablet by mouth every evening.     . clopidogrel (PLAVIX) 75 MG tablet TAKE 1 TABLET BY MOUTH EVERY DAY WITH BREAKFAST 30 tablet 5  . Coenzyme Q-10 100 MG capsule Take 200 mg by mouth daily.     Marland Kitchen diltiazem (CARDIZEM CD) 120 MG 24 hr capsule Take 1 capsule (120 mg total) by mouth at bedtime. 30 capsule 6  . diltiazem (CARDIZEM CD) 240 MG 24 hr capsule Take 1 capsule (240 mg total) by mouth daily. With breakfast 30 capsule 1  . furosemide (LASIX) 40 MG tablet Take 80 mg by mouth daily.    . isosorbide mononitrate (IMDUR) 60 MG 24 hr tablet TAKE 1 TABLET (60 MG TOTAL) BY MOUTH DAILY. 30 tablet 9  . KLOR-CON M20 20 MEQ tablet TAKE 1 TABLET (20 MEQ TOTAL) BY MOUTH DAILY. 30 tablet 9  . metoprolol (LOPRESSOR) 100 MG tablet TAKE 1 TABLET (100 MG TOTAL)  BY MOUTH 2 (TWO) TIMES DAILY. 60 tablet 11  . metoprolol tartrate (LOPRESSOR) 25 MG tablet Take 1 tablet (25 mg total) by mouth 2 (two) times daily. Please take it a long with 100mg  tablet, to make dose 125mg  twice a day 60 tablet 1  . Multiple Vitamin (MULTIVITAMIN) capsule Take 1 capsule by mouth daily.    . nitroGLYCERIN (NITROSTAT) 0.4 MG SL tablet Place 0.4 mg under the tongue every 5 (five) minutes as needed for chest pain. 3 dose max    . Omega-3 Fatty Acids (FISH OIL PO) Take 1,200 mg by mouth daily.     Marland Kitchen spironolactone (ALDACTONE) 25 MG tablet Take 25 mg by mouth daily as needed (fluid).  45 tablet 11  . warfarin (COUMADIN) 2 MG tablet TAKE AS DIRECTED BY COUMADIN CLINIC (Patient taking differently: take 1 mg on Monday and Friday all other days take 2 mg.) 30 tablet 3   No  current facility-administered medications for this visit.     Physical Exam: Vitals:   07/21/16 1525  BP: 134/60  Pulse: (!) 54  SpO2: 96%  Weight: 172 lb 3.2 oz (78.1 kg)  Height: 5' (1.524 m)    GEN- The patient is well appearing, alert and oriented x 3 today.   Head- normocephalic, atraumatic Eyes-  Sclera clear, conjunctiva pink Ears- hearing intact Oropharynx- clear Lungs- Clear to ausculation bilaterally, normal work of breathing Heart- iregular rate and rhythm, no murmurs, rubs or gallops, PMI not laterally displaced GI- soft, NT, ND, + BS Extremities- no clubbing, cyanosis, or edema  EKG tracing ordered today is personally reviewed and shows afib, V rate 54 bpm  Assessment and Plan:  1. persistent afib/ atypical atrial flutter Currently rate controlled No changes today On coumadin Stop ASA when able (will defer to general cardiology) given triple anticoagulation  2. Bradycardia Asymptomatic Will follow conservatively  Her leadless pacemaker has premature battery depletion and is no longer active.  Alternatives for treatment of bradycardia including watchful waiting, new leadless pacemaker (Micra) or transvenous pacing have been discussed with her. As she no longer has sinus rhythm and does not have any symptoms of bradycardia, we are in agreement to a conservative watchful waiting approach.  If she has additional symptomatic bradycardia then we should reconsider.  3. CAD S/p recent PCI On asa, plavix, and coumadin Would consider stopping ASA after 30 days.  Will defer to Dr Irish Lack and his team.  4. HTN Stable No change required today  Return to see me in 4 months  Thompson Grayer MD, [facility] 07/21/2016 3:26 PM

## 2016-07-24 DIAGNOSIS — F4323 Adjustment disorder with mixed anxiety and depressed mood: Secondary | ICD-10-CM | POA: Diagnosis not present

## 2016-07-25 ENCOUNTER — Ambulatory Visit (INDEPENDENT_AMBULATORY_CARE_PROVIDER_SITE_OTHER): Payer: Medicare Other

## 2016-07-25 DIAGNOSIS — I4891 Unspecified atrial fibrillation: Secondary | ICD-10-CM | POA: Diagnosis not present

## 2016-07-25 DIAGNOSIS — E876 Hypokalemia: Secondary | ICD-10-CM | POA: Diagnosis not present

## 2016-07-25 DIAGNOSIS — I214 Non-ST elevation (NSTEMI) myocardial infarction: Secondary | ICD-10-CM | POA: Diagnosis not present

## 2016-07-25 DIAGNOSIS — Z5181 Encounter for therapeutic drug level monitoring: Secondary | ICD-10-CM

## 2016-07-25 DIAGNOSIS — N390 Urinary tract infection, site not specified: Secondary | ICD-10-CM | POA: Diagnosis not present

## 2016-07-25 DIAGNOSIS — I10 Essential (primary) hypertension: Secondary | ICD-10-CM | POA: Diagnosis not present

## 2016-07-25 DIAGNOSIS — I5023 Acute on chronic systolic (congestive) heart failure: Secondary | ICD-10-CM | POA: Diagnosis not present

## 2016-07-25 DIAGNOSIS — I481 Persistent atrial fibrillation: Secondary | ICD-10-CM | POA: Diagnosis not present

## 2016-07-25 DIAGNOSIS — I200 Unstable angina: Secondary | ICD-10-CM

## 2016-07-25 LAB — POCT INR: INR: 2

## 2016-07-31 ENCOUNTER — Ambulatory Visit (INDEPENDENT_AMBULATORY_CARE_PROVIDER_SITE_OTHER): Payer: Medicare Other | Admitting: Physician Assistant

## 2016-07-31 ENCOUNTER — Encounter: Payer: Self-pay | Admitting: Physician Assistant

## 2016-07-31 VITALS — BP 122/58 | HR 56 | Ht 60.0 in | Wt 173.1 lb

## 2016-07-31 DIAGNOSIS — I5042 Chronic combined systolic (congestive) and diastolic (congestive) heart failure: Secondary | ICD-10-CM | POA: Diagnosis not present

## 2016-07-31 DIAGNOSIS — I482 Chronic atrial fibrillation: Secondary | ICD-10-CM | POA: Diagnosis not present

## 2016-07-31 DIAGNOSIS — D649 Anemia, unspecified: Secondary | ICD-10-CM

## 2016-07-31 DIAGNOSIS — I2510 Atherosclerotic heart disease of native coronary artery without angina pectoris: Secondary | ICD-10-CM

## 2016-07-31 DIAGNOSIS — I4821 Permanent atrial fibrillation: Secondary | ICD-10-CM

## 2016-07-31 DIAGNOSIS — I200 Unstable angina: Secondary | ICD-10-CM

## 2016-07-31 NOTE — Progress Notes (Signed)
Cardiology Office Note    Date:  07/31/2016  ID:  Patty [name], DOB 02-Jul-1930, MRN [id number] PCP:  [name], MD  Cardiologist:  Irish Lack / EP - Dr. [name]   Chief Complaint: f/u cath  History of Present Illness:  Patty [name] is a 83 y.o. female with history of CAD s/p multiple stents, chronic combined CHF, permanent atrial fib/flutter on Coumadin with tachy-brady syndrome s/p leadless PPM (with premature battery failure no longer active - being followed conservatively for now), probable CKD III per labs, granulosa cell carcinoma, HTN, HLD, LBBB, varicose veins, renal artery stenosis, PVD, diverticulitis, OSA who presents for f/u.  She has significant CAD with stenting x 2 to the LAD and x 1 to the LCx in 2000, rotational arthrectomy to proximal LCx and DES to LAD in 2014 and DES to LCx in 2014, along with DES to LAD for re-in-stent stenosis in 2015, and DES to ostial LAD in 2016. PVD is listed in chart but I cannot find further notes regarding this as LE doppler 2014 showed no evidence of significant LE disease and I cannot find prior renal angio information. She was recently admitted earlier this month with exertional dyspnea and chest tightness and palpitations. She was found to be in rapid atrial fib/flutter and minimally elevated troponin. 2D Echo 07/11/16 showed moderate focal basal and mild concentric hypertrophy, EF 50-55%, no RWMA, septal dyssyngery c/w LBBB, mild AI, massively dilated LA, trivial TR, mild PR. Coumadin was held in prep for cath which was performed 07/14/16 showing 100% CTO of OM1 with L-L collaterals, 90% distal Cx, 100% distal PLOM, otherwise nonobstructive disease, normal LVEDP - unsuccessful PCI of the distal Cx due to inability to cross the lesion with a balloon. She was brought back 07/15/16 and underwent successful orbital atherectomy & DES to mid LCx.  She was discharged on ASA, Plavix and Coumadin with plan to discontinue ASA after 1 month. Last labs 07/2016  showed K 3.2-3.9, Cr 1.01, Hgb 9.4-10.5 (previously 10.7-12.5 in 12/2015), LDL 60.  She presents back for follow-up overall feeling great. She admits she got sort of a slow start getting back into activity out of the hospital but day by day has felt better and better. No chest pain, SOB, dizziness, palpitations, syncope. Tolerating meds without bleeding. Saw Dr. [name] 07/21/16 and was felt to be doing well, continuing conservative monitoring for her tachy-brady syndrome.   Past Medical History:  Diagnosis Date  . Chronic anticoagulation - coumadin, CHADS2VASC=6 05/17/2015  . CKD (chronic kidney disease), stage III   . Combined systolic and diastolic heart failure ([place])   . Coronary artery disease    a. s/p multiple stents - stenting x 2 to the LAD and x 1 to the LCx in 2000, rotational arthrectomy to proximal LCx and DES to LAD in 2014 and DES to LCx in 2014, along with DES to LAD for re-in-stent stenosis in 2015, and DES to ostial LAD in 2016. b. 07/2016 - orbital atherectomy & DES to mid LCx.  . Diverticulitis   . Granulosa cell carcinoma ([place])    abd; last episode was in 2009  . Hyperlipidemia   . Hypertension   . LBBB (left bundle branch block)       . Myocardial infarction ([place]) 2002  . Obesity   . OSA on CPAP   . Pacemaker failure    a. Prior leadless PPM with premature battery failure, being managed conservatively without replacement.  . Peripheral vascular disease ([place])   .  Permanent atrial fibrillation ([place]) 2013  . Renal artery stenosis ([place])   . Tachycardia-bradycardia syndrome ([place])    a. s/p leadless pacemaker ([name]) implanted by Dr [name]  . Varicose veins     Past Surgical History:  Procedure Laterality Date  . ABDOMINAL HYSTERECTOMY    . CARDIAC CATHETERIZATION  09/03/2007   EF 70%; Failed attempt at PCI to OM  . CARDIAC CATHETERIZATION  11/01/2003   EF 70%  . CARDIAC CATHETERIZATION N/A 12/14/2015   Procedure: Left Heart Cath and Coronary Angiography;   Surgeon: Burnell Blanks, MD;  Location: [place] CV LAB;  Service: Cardiovascular;  Laterality: N/A;  . CARDIOVERSION  12/31/2011   Procedure: CARDIOVERSION;  Surgeon: [name], MD;  Location: [facility] ENDOSCOPY;  Service: Cardiovascular;  Laterality: N/A;  . CARDIOVERSION N/A 12/31/2011   Procedure: CARDIOVERSION;  Surgeon: [name], MD;  Location: [facility] CATH LAB;  Service: Cardiovascular;  Laterality: N/A;  . CATARACT EXTRACTION, BILATERAL  2015  . CHOLECYSTECTOMY  1980's  . COLON SURGERY  2004   colectomy for diverticulosis  . CORONARY ANGIOPLASTY WITH STENT PLACEMENT  2000; 08/11/2012; 11/12/2012   3 + 2 LAD & CFX; 2nd CFX stent 11/12/2012  . CORONARY ATHERECTOMY N/A 07/15/2016   Procedure: Coronary Atherectomy;  Surgeon: Martinique, Peter M, MD;  Location: [place] CV LAB;  Service: Cardiovascular;  Laterality: N/A;  . CORONARY BALLOON ANGIOPLASTY N/A 07/14/2016   Procedure: Coronary Balloon Angioplasty;  Surgeon: Martinique, Peter M, MD;  Location: [place] CV LAB;  Service: Cardiovascular;  Laterality: N/A;  . CORONARY STENT INTERVENTION N/A 07/15/2016   Procedure: Coronary Stent Intervention;  Surgeon: Martinique, Peter M, MD;  Location: [place] CV LAB;  Service: Cardiovascular;  Laterality: N/A;  . FRACTIONAL FLOW RESERVE WIRE  10/07/2013   Procedure: [place];  Surgeon: [name], MD;  Location: [facility] CATH LAB;  Service: Cardiovascular;;  . granulosa tumor excision  2000; 2003; 2004; 2007   "all in my abdomen including small intestines, outside my ?uterus/etc" (08/11/2012)  . HERNIA REPAIR  2005   "laparoscopic"  . LEFT HEART CATH AND CORONARY ANGIOGRAPHY N/A 07/14/2016   Procedure: Left Heart Cath and Coronary Angiography;  Surgeon: Martinique, Peter M, MD;  Location: [place] CV LAB;  Service: Cardiovascular;  Laterality: N/A;  . LEFT HEART CATHETERIZATION WITH CORONARY ANGIOGRAM N/A 11/12/2012   Procedure: LEFT HEART CATHETERIZATION WITH  CORONARY ANGIOGRAM;  Surgeon: [name], MD;  Location: [facility] CATH LAB;  Service: Cardiovascular;  Laterality: N/A;  . LEFT HEART CATHETERIZATION WITH CORONARY ANGIOGRAM N/A 10/07/2013   Procedure: LEFT HEART CATHETERIZATION WITH CORONARY ANGIOGRAM;  Surgeon: [name], MD;  Location: [facility] CATH LAB;  Service: Cardiovascular;  Laterality: N/A;  . LEFT HEART CATHETERIZATION WITH CORONARY ANGIOGRAM N/A 12/14/2013   Procedure: LEFT HEART CATHETERIZATION WITH CORONARY ANGIOGRAM;  Surgeon: Sinclair Grooms, MD;  Location: [facility] CATH LAB;  Service: Cardiovascular;  Laterality: N/A;  . LEFT HEART CATHETERIZATION WITH CORONARY ANGIOGRAM N/A 05/16/2014   Procedure: LEFT HEART CATHETERIZATION WITH CORONARY ANGIOGRAM;  Surgeon: Sherren Mocha, MD;  Location: [facility] CATH LAB;  Service: Cardiovascular;  Laterality: N/A;  . PERCUTANEOUS CORONARY INTERVENTION-BALLOON ONLY  08/04/2012   Procedure: PERCUTANEOUS CORONARY INTERVENTION-BALLOON ONLY;  Surgeon: [name], MD;  Location: [facility] CATH LAB;  Service: Cardiovascular;;  . PERCUTANEOUS CORONARY ROTOBLATOR INTERVENTION (PCI-R) N/A 08/11/2012   Procedure: PERCUTANEOUS CORONARY ROTOBLATOR INTERVENTION (PCI-R);  Surgeon: [name], MD;  Location: [facility] CATH LAB;  Service:  Cardiovascular;  Laterality: N/A;  . PERCUTANEOUS CORONARY STENT INTERVENTION (PCI-S)  10/07/2013   Procedure: PERCUTANEOUS CORONARY STENT INTERVENTION (PCI-S);  Surgeon: [name], MD;  Location: [facility] CATH LAB;  Service: Cardiovascular;;  . PERMANENT PACEMAKER INSERTION N/A 03/16/2012   [name] (SJM) leadless pacemaker (LEADLESS II STUDY PATEINT)  . SALIVARY GLAND SURGERY  2000's   "had a little lump removed; granulosa related; it was benign" (08/11/2012)  . TEE WITHOUT CARDIOVERSION  12/31/2011   Procedure: TRANSESOPHAGEAL ECHOCARDIOGRAM (TEE);  Surgeon: [name], MD;  Location: [facility] ENDOSCOPY;  Service: Cardiovascular;  Laterality: N/A;  . VARICOSE VEIN SURGERY  Bilateral 1977    Current Medications: Current Meds  Medication Sig  . aspirin 81 MG chewable tablet Chew 1 tablet (81 mg total) by mouth daily.  Marland Kitchen atorvastatin (LIPITOR) 80 MG tablet TAKE 1 TABLET BY MOUTH EVERY MORNING  . beta carotene w/minerals (OCUVITE) tablet Take 1 tablet by mouth daily.   . Calcium Carb-Cholecalciferol (CALTRATE 600+D) 600-800 MG-UNIT TABS Take 1 tablet by mouth every evening.   . clopidogrel (PLAVIX) 75 MG tablet TAKE 1 TABLET BY MOUTH EVERY DAY WITH BREAKFAST  . Coenzyme Q-10 100 MG capsule Take 200 mg by mouth daily.   Marland Kitchen diltiazem (CARDIZEM CD) 120 MG 24 hr capsule Take 1 capsule (120 mg total) by mouth at bedtime.  Marland Kitchen diltiazem (CARDIZEM CD) 240 MG 24 hr capsule Take 1 capsule (240 mg total) by mouth daily. With breakfast  . furosemide (LASIX) 40 MG tablet Take 80 mg by mouth daily.  . isosorbide mononitrate (IMDUR) 60 MG 24 hr tablet TAKE 1 TABLET (60 MG TOTAL) BY MOUTH DAILY.  Marland Kitchen KLOR-CON M20 20 MEQ tablet TAKE 1 TABLET (20 MEQ TOTAL) BY MOUTH DAILY.  . metoprolol (LOPRESSOR) 100 MG tablet TAKE 1 TABLET (100 MG TOTAL) BY MOUTH 2 (TWO) TIMES DAILY.  . metoprolol tartrate (LOPRESSOR) 25 MG tablet Take 1 tablet (25 mg total) by mouth 2 (two) times daily. Please take it a long with 100mg  tablet, to make dose 125mg  twice a day  . Multiple Vitamin (MULTIVITAMIN) capsule Take 1 capsule by mouth daily.  . nitroGLYCERIN (NITROSTAT) 0.4 MG SL tablet Place 0.4 mg under the tongue every 5 (five) minutes as needed for chest pain. 3 dose max  . Omega-3 Fatty Acids (FISH OIL PO) Take 1,200 mg by mouth daily.   Marland Kitchen spironolactone (ALDACTONE) 25 MG tablet Take 25 mg by mouth daily as needed (fluid).   . warfarin (COUMADIN) 2 MG tablet TAKE AS DIRECTED BY COUMADIN CLINIC (Patient taking differently: take 1 mg on Monday and Friday all other days take 2 mg.)     Allergies:   Bee venom and Other   Social History   Social History  . Marital status: Widowed    Spouse name: N/A  .  Number of children: 4  . Years of education: N/A   Occupational History  . Retired Counsellor    Social History Main Topics  . Smoking status: Former Smoker    Packs/day: 1.00    Years: 32.00    Types: Cigarettes    Quit date: 02/03/1974  . Smokeless tobacco: Never Used  . Alcohol use 3.0 oz/week    5 Glasses of wine per week     Comment: 2017 WINE WITH DINNER   . Drug use: No  . Sexual activity: No   Other Topics Concern  . None   Social History Narrative   Lives with family.  Family History:  Family History  Problem Relation Age of Onset  . Stroke Mother   . Heart attack Father   . Diabetes Father   . Hypertension Father   . Heart attack Brother   . Diabetes Brother   . Hypertension Brother   . Kidney failure Brother     ROS:   Please see the history of present illness. All other systems are reviewed and otherwise negative.    PHYSICAL EXAM:   VS:  BP (!) 122/58   Pulse (!) 56   Ht 5' (1.524 m)   Wt 173 lb 1.9 oz (78.5 kg)   SpO2 95%   BMI 33.81 kg/m   BMI: Body mass index is 33.81 kg/m.  Initial BP logged 104/48 but recheck by me as above GEN: Well nourished, well developed obese WF, in no acute distress  HEENT: normocephalic, atraumatic Neck: no JVD, carotid bruits, or masses Cardiac: irregularly irregular, normal rate; no murmurs, rubs, or gallops, no edema  Respiratory:  clear to auscultation bilaterally, normal work of breathing GI: soft, nontender, nondistended, + BS MS: no deformity or atrophy  Skin: warm and dry, no rash Neuro:  Alert and Oriented x 3, Strength and sensation are intact, follows commands Psych: euthymic mood, full affect  Wt Readings from Last 3 Encounters:  07/31/16 173 lb 1.9 oz (78.5 kg)  07/21/16 172 lb 3.2 oz (78.1 kg)  07/15/16 170 lb 6.7 oz (77.3 kg)      Studies/Labs Reviewed:   EKG:  EKG was not ordered today, but reviewed EKG from 07/21/16 showing atrial fib.  Recent Labs: 07/10/2016: B  Natriuretic Peptide 361.3 07/11/2016: TSH 2.360 07/12/2016: ALT 29; Magnesium 1.9 07/16/2016: BUN 15; Creatinine, Ser 1.01; Hemoglobin 9.4; Platelets 350; Potassium 3.8; Sodium 137   Lipid Panel    Component Value Date/Time   CHOL 107 07/11/2016 0926   CHOL 160 02/13/2016 0855   TRIG 46 07/11/2016 0926   HDL 38 (L) 07/11/2016 0926   HDL 68 02/13/2016 0855   CHOLHDL 2.8 07/11/2016 0926   VLDL 9 07/11/2016 0926   LDLCALC 60 07/11/2016 0926   LDLCALC 75 02/13/2016 0855    Additional studies/ records that were reviewed today include: Summarized above.    ASSESSMENT & PLAN:   1. CAD - doing well post-PCI. Eager to get back into cardiac rehab. She will be contacting them to re-enroll. She is cleared to participate from my standpoint. Per cath note, plan was for triple therapy for 30 days then to DC aspirin but continue Plavix and Coumadin. Her last day of Aspirin will be July 11th, after which she will stop this. She verbalized understanding of this. Also discussed importance of monitoring for any bleeding. We called Dr. Lina Sar office since she had repeat bloodwork on 6/22 and her Hgb was 10.9 which is stable post-procedurally Cr was 1.140 which also appeared to be stable. Ultimate duration of Plavix will be at the discretion of her primary cardiologist - she was on both Coumadin and Plavix prior to recent admission. 2. Chronic combined CHF - appears euvolemic, recent EF near normal. Continue present regimen. 3. Permanent atrial fib/flutter with tachy-brady syndrome now with non-functioning pacemaker -  stable, continue rate control. Followed by Dr. [name]. 4. Anemia - stable by recheck on 07/25/16. Further monitoring per primary care.  Disposition: F/u with Dr. Irish Lack in 3 months.   Medication Adjustments/Labs and Tests Ordered: Current medicines are reviewed at length with the patient today.  Concerns regarding medicines are  outlined above. Medication changes, Labs and Tests ordered  today are summarized above and listed in the Patient Instructions accessible in Encounters.   Signed, Charlie Pitter, PA-C  07/31/2016 1:34 PM    [facility] [address], [place], [place]  [address] Phone: [phone]; Fax: [phone]

## 2016-07-31 NOTE — Patient Instructions (Signed)
Medication Instructions:  Your physician recommends that you continue on your current medications as directed. Please refer to the Current Medication list given to you today BUT YOUR LAST DOSE OF ASPIRIN WILL BE 08/13/16.   Labwork: None ordered  Testing/Procedures: None ordered  Follow-Up: Your physician recommends that you schedule a follow-up appointment in: 3 MONTHS WITH DR. VARANASI   Any Other Special Instructions Will Be Listed Below (If Applicable).     If you need a refill on your cardiac medications before your next appointment, please call your pharmacy.

## 2016-08-07 ENCOUNTER — Telehealth: Payer: Self-pay | Admitting: Internal Medicine

## 2016-08-07 DIAGNOSIS — F4323 Adjustment disorder with mixed anxiety and depressed mood: Secondary | ICD-10-CM | POA: Diagnosis not present

## 2016-08-07 NOTE — Telephone Encounter (Signed)
New message    Pt states that she is going to have a general dental cleaning - no surgery - and wants to know how this would effect a stent? Pt requests a call back.

## 2016-08-07 NOTE — Telephone Encounter (Signed)
Pt actually has a deep cleaning, where they go under her gums, and cause her to bleed. She states that she is on Comadin and Plavix.  Can she have this done?  Should cancel for a while due to new stent/coumadin/plavix?  Please advise!

## 2016-08-08 ENCOUNTER — Telehealth (HOSPITAL_COMMUNITY): Payer: Self-pay

## 2016-08-08 ENCOUNTER — Telehealth (HOSPITAL_COMMUNITY): Payer: Self-pay | Admitting: *Deleted

## 2016-08-08 NOTE — Telephone Encounter (Signed)
Returned pt call from earlier today.  Advised pt that due to the extent of her most recent cardiac event, she should plan to attend the phase II program first then transitioned back to the maintenance program.  Cardiac rehab would reassess after pt participates in phase II for a month then decide if pt should continue with phase II or transition back to rehab.  Contact information provided. Cherre Huger, BSN Cardiac and Training and development officer

## 2016-08-08 NOTE — Telephone Encounter (Signed)
I would recommend she delay this general cleaning for several months. Dental health is important but I do not want to risk her having a major bleeding event requiring interruption in her blood thinners given her recent coronary intervention. Would have her discuss with Dr. Irish Lack at follow-up visit. Of note she is on triple therapy (ASA, Plavix, Coumadin) until date outlined in last OV, then drop aspirin (so last dose of aspirin 08/13/16)- please make sure that's what she meant. Dayna Dunn PA-C

## 2016-08-08 NOTE — Telephone Encounter (Signed)
I called and spoke to patient about scheduling for cardiac rehab. Patient wants to continue doing cardiac rehab maintenance program instead of the monitored cardiac rehab. Patient would like for [facility] to call her. Information given to [facility] to follow up with patient.

## 2016-08-12 ENCOUNTER — Other Ambulatory Visit (HOSPITAL_COMMUNITY): Payer: Self-pay | Admitting: Nurse Practitioner

## 2016-08-13 ENCOUNTER — Telehealth: Payer: Self-pay | Admitting: Interventional Cardiology

## 2016-08-13 NOTE — Telephone Encounter (Signed)
Spoke with patient and she states that she stopped taking her ASA today as directed at her last OV on 07/31/16. Patient asking if she should stop taking her coumadin and plavix to have a dental procedure done. Patient with recent stent placement on 07/15/16. Patient instructed to not stop her plavix and coumadin. Patient states that she also is scheduled to have a skin surgery done in September and may need to stop her medicines as well. Patient made aware that usually 1 year of continuous therapy after stent placement is required. Patient made aware that if she was required to stop her coumadin or plavix prior to her procedures that they would need to send in a clearance request for Dr. Irish Lack to give his recommendations. Patient verbalized understanding and was in agreement with this plan.

## 2016-08-13 NOTE — Telephone Encounter (Signed)
New message   Pt calling this in   1. What dental office are you calling from? Dr Atlee Abide   2. What is your office phone and fax number? [phone] ask for Judson Roch  (pt did not have fax number)  3. What type of procedure is the patient having performed? Dental cleaning  4. What date is procedure scheduled?   Not scheduled yet   5. What is your question (ex. Antibiotics prior to procedure, holding medication-we need to know how long dentist wants pt to hold med)?  She is on blood thinner and she just had surgery last month , is it too soon to have dental cleaning? She is on aspirin , coumadin and plavix

## 2016-08-19 ENCOUNTER — Encounter (HOSPITAL_COMMUNITY): Payer: Self-pay

## 2016-08-19 ENCOUNTER — Encounter (HOSPITAL_COMMUNITY)
Admission: RE | Admit: 2016-08-19 | Discharge: 2016-08-19 | Disposition: A | Discharge status: Home / Self Care | Payer: Medicare Other | Source: Ambulatory Visit | Attending: Interventional Cardiology | Admitting: Interventional Cardiology

## 2016-08-19 VITALS — BP 118/64 | HR 72 | Ht 60.5 in | Wt 172.4 lb

## 2016-08-19 DIAGNOSIS — Z955 Presence of coronary angioplasty implant and graft: Secondary | ICD-10-CM

## 2016-08-19 DIAGNOSIS — I208 Other forms of angina pectoris: Secondary | ICD-10-CM | POA: Diagnosis not present

## 2016-08-19 DIAGNOSIS — I214 Non-ST elevation (NSTEMI) myocardial infarction: Secondary | ICD-10-CM

## 2016-08-19 NOTE — Progress Notes (Signed)
Cardiac Individual Treatment Plan  Patient Details  Name: Patty [name] MRN: [id number] Date of Birth: Jun 15, 1930 Referring Provider:     CARDIAC REHAB PHASE II ORIENTATION from 08/19/2016 in [place]  Referring Provider  Larae Grooms, MD      Initial Encounter Date:    CARDIAC REHAB PHASE II ORIENTATION from 08/19/2016 in [place]  Date  08/19/16  Referring Provider  Larae Grooms, MD      Visit Diagnosis: 07/10/16 NSTEMI  07/15/16 DES Cx  Patient's Home Medications on Admission:  Current Outpatient Prescriptions:  .  atorvastatin (LIPITOR) 80 MG tablet, TAKE 1 TABLET BY MOUTH EVERY MORNING, Disp: 30 tablet, Rfl: 9 .  beta carotene w/minerals (OCUVITE) tablet, Take 1 tablet by mouth daily. , Disp: , Rfl:  .  Calcium Carb-Cholecalciferol (CALTRATE 600+D) 600-800 MG-UNIT TABS, Take 1 tablet by mouth every evening. , Disp: , Rfl:  .  clopidogrel (PLAVIX) 75 MG tablet, TAKE 1 TABLET BY MOUTH EVERY DAY WITH BREAKFAST, Disp: 30 tablet, Rfl: 5 .  Coenzyme Q-10 100 MG capsule, Take 200 mg by mouth daily. , Disp: , Rfl:  .  diltiazem (CARDIZEM CD) 120 MG 24 hr capsule, TAKE 1 CAPSULE (120 MG TOTAL) BY MOUTH AT BEDTIME., Disp: 90 capsule, Rfl: 3 .  diltiazem (CARDIZEM CD) 240 MG 24 hr capsule, Take 1 capsule (240 mg total) by mouth daily. With breakfast, Disp: 30 capsule, Rfl: 1 .  furosemide (LASIX) 40 MG tablet, Take 80 mg by mouth daily., Disp: , Rfl:  .  isosorbide mononitrate (IMDUR) 60 MG 24 hr tablet, TAKE 1 TABLET (60 MG TOTAL) BY MOUTH DAILY., Disp: 30 tablet, Rfl: 9 .  KLOR-CON M20 20 MEQ tablet, TAKE 1 TABLET (20 MEQ TOTAL) BY MOUTH DAILY., Disp: 30 tablet, Rfl: 9 .  metoprolol (LOPRESSOR) 100 MG tablet, TAKE 1 TABLET (100 MG TOTAL) BY MOUTH 2 (TWO) TIMES DAILY., Disp: 60 tablet, Rfl: 11 .  metoprolol tartrate (LOPRESSOR) 25 MG tablet, Take 1 tablet (25 mg total) by mouth 2 (two) times daily. Please take  it a long with 100mg  tablet, to make dose 125mg  twice a day, Disp: 60 tablet, Rfl: 1 .  Multiple Vitamin (MULTIVITAMIN) capsule, Take 1 capsule by mouth daily., Disp: , Rfl:  .  nitroGLYCERIN (NITROSTAT) 0.4 MG SL tablet, Place 0.4 mg under the tongue every 5 (five) minutes as needed for chest pain. 3 dose max, Disp: , Rfl:  .  Omega-3 Fatty Acids (FISH OIL PO), Take 1,200 mg by mouth daily. , Disp: , Rfl:  .  spironolactone (ALDACTONE) 25 MG tablet, Take 25 mg by mouth daily as needed (fluid). , Disp: 45 tablet, Rfl: 11 .  warfarin (COUMADIN) 2 MG tablet, TAKE AS DIRECTED BY COUMADIN CLINIC (Patient taking differently: take 1 mg on Monday and Friday all other days take 2 mg.), Disp: 30 tablet, Rfl: 3 .  aspirin 81 MG chewable tablet, Chew 1 tablet (81 mg total) by mouth daily. (Patient not taking: Reported on 08/19/2016), Disp: 30 tablet, Rfl: 0  Past Medical History: Past Medical History:  Diagnosis Date  . Chronic anticoagulation - coumadin, CHADS2VASC=6 05/17/2015  . CKD (chronic kidney disease), stage III   . Combined systolic and diastolic heart failure ([place])   . Coronary artery disease    a. s/p multiple stents - stenting x 2 to the LAD and x 1 to the LCx in 2000, rotational arthrectomy to proximal LCx and DES to  LAD in 2014 and DES to LCx in 2014, along with DES to LAD for re-in-stent stenosis in 2015, and DES to ostial LAD in 2016. b. 07/2016 - orbital atherectomy & DES to mid LCx.  . Diverticulitis   . Granulosa cell carcinoma ([place])    abd; last episode was in 2009  . Hyperlipidemia   . Hypertension   . LBBB (left bundle branch block)       . Myocardial infarction ([place]) 2002  . Obesity   . OSA on CPAP   . Pacemaker failure    a. Prior leadless PPM with premature battery failure, being managed conservatively without replacement.  . Peripheral vascular disease ([place])   . Permanent atrial fibrillation ([place]) 2013  . Renal artery stenosis ([place])   . Tachycardia-bradycardia syndrome  ([place])    a. s/p leadless pacemaker ([name]) implanted by Dr [name]  . Varicose veins     Tobacco Use: History  Smoking Status  . Former Smoker  . Packs/day: 1.00  . Years: 32.00  . Types: Cigarettes  . Quit date: 02/03/1974  Smokeless Tobacco  . Never Used    Labs: Recent Review Flowsheet Data    Labs for ITP Cardiac and Pulmonary Rehab Latest Ref Rng & Units 05/14/2014 01/02/2015 05/17/2015 02/13/2016 07/11/2016   Cholestrol 0 - 200 mg/dL - 136 - 160 107   LDLCALC 0 - 99 mg/dL - 61 - 75 60   HDL >40 mg/dL - 62 - 68 38(L)   Trlycerides <150 mg/dL - 67 - 84 46   Hemoglobin A1c 4.8 - 5.6 % - - 6.0(H) - -   TCO2 0 - 100 mmol/L 24 - - - -      Capillary Blood Glucose: Lab Results  Component Value Date   GLUCAP 85 05/16/2014   GLUCAP 81 05/15/2014   GLUCAP 102 (H) 05/14/2014   GLUCAP 106 (H) 12/01/2012     Exercise Target Goals: Date: 08/19/16  Exercise Program Goal: Individual exercise prescription set with THRR, safety & activity barriers. Participant demonstrates ability to understand and report RPE using BORG scale, to self-measure pulse accurately, and to acknowledge the importance of the exercise prescription.  Exercise Prescription Goal: Starting with aerobic activity 30 plus minutes a day, 3 days per week for initial exercise prescription. Provide home exercise prescription and guidelines that participant acknowledges understanding prior to discharge.  Activity Barriers & Risk Stratification:     Activity Barriers & Cardiac Risk Stratification - 08/19/16 1429      Activity Barriers & Cardiac Risk Stratification   Activity Barriers Arthritis;Assistive Device   Cardiac Risk Stratification High      6 Minute Walk:     6 Minute Walk    Row Name 08/19/16 1604         6 Minute Walk   Phase Initial     Distance 1339 feet     Walk Time 6 minutes     # of Rest Breaks 0     MPH 2.54     METS 1.61     RPE 12     VO2 Peak 5.65     Symptoms No     Resting  HR 72 bpm     Resting BP 118/64     Max Ex. HR 76 bpm     Max Ex. BP 138/80     2 Minute Post BP 122/68        Oxygen Initial Assessment:   Oxygen Re-Evaluation:   Oxygen Discharge (  Final Oxygen Re-Evaluation):   Initial Exercise Prescription:     Initial Exercise Prescription - 08/19/16 1600      Date of Initial Exercise RX and Referring Provider   Date 08/19/16   Referring Provider Larae Grooms, MD     Recumbant Bike   Level --   Watts --   Minutes --   METs --     NuStep   Level 2   SPM 85   Minutes 10   METs 2.5     Arm Ergometer   Level 1   Watts 15   Minutes 10   METs 2.02     Track   Laps 11   Minutes 10   METs 2.92     Prescription Details   Frequency (times per week) 2   Duration Progress to 30 minutes of continuous aerobic without signs/symptoms of physical distress     Intensity   THRR 40-80% of Max Heartrate 54-109   Ratings of Perceived Exertion 11-13   Perceived Dyspnea 0-4     Progression   Progression Continue to progress workloads to maintain intensity without signs/symptoms of physical distress.     Resistance Training   Training Prescription Yes   Weight 2lbs   Reps 10-15      Perform Capillary Blood Glucose checks as needed.  Exercise Prescription Changes:   Exercise Comments:   Exercise Goals and Review:     Exercise Goals    Row Name 08/19/16 1603             Exercise Goals   Increase Physical Activity Yes       Intervention Provide advice, education, support and counseling about physical activity/exercise needs.;Develop an individualized exercise prescription for aerobic and resistive training based on initial evaluation findings, risk stratification, comorbidities and participant's personal goals.       Expected Outcomes Achievement of increased cardiorespiratory fitness and enhanced flexibility, muscular endurance and strength shown through measurements of functional capacity and personal statement  of participant.       Increase Strength and Stamina Yes       Intervention Provide advice, education, support and counseling about physical activity/exercise needs.;Develop an individualized exercise prescription for aerobic and resistive training based on initial evaluation findings, risk stratification, comorbidities and participant's personal goals.       Expected Outcomes Achievement of increased cardiorespiratory fitness and enhanced flexibility, muscular endurance and strength shown through measurements of functional capacity and personal statement of participant.          Exercise Goals Re-Evaluation :    Discharge Exercise Prescription (Final Exercise Prescription Changes):   Nutrition:  Target Goals: Understanding of nutrition guidelines, daily intake of sodium 1500mg , cholesterol 200mg , calories 30% from fat and 7% or less from saturated fats, daily to have 5 or more servings of fruits and vegetables.  Biometrics:     Pre Biometrics - 08/19/16 1607      Pre Biometrics   Height 5' 0.5" (1.537 m)   Weight 172 lb 6.4 oz (78.2 kg)   Waist Circumference 41.5 inches   Hip Circumference 46.5 inches   Waist to Hip Ratio 0.89 %   BMI (Calculated) 33.2   Triceps Skinfold 18 mm   % Body Fat 43.8 %   Grip Strength 27.5 kg   Flexibility 13.5 in   Single Leg Stand 1.96 seconds       Nutrition Therapy Plan and Nutrition Goals:     Nutrition Therapy & Goals - 08/19/16 1551  Nutrition Therapy   Diet Therapeutic Lifestyle Changes   Drug/Food Interactions Coumadin/Vit K     Personal Nutrition Goals   Nutrition Goal Wt loss of 1-2 lb/week to a wt loss goal of 6-24 lb while in [place], educate and counsel regarding individualized specific dietary modifications aiming towards targeted core components such as weight, hypertension, lipid management, diabetes, heart failure and other comorbidities.   Expected Outcomes  Short Term Goal: Understand basic principles of dietary content, such as calories, fat, sodium, cholesterol and nutrients.;Long Term Goal: Adherence to prescribed nutrition plan.      Nutrition Discharge: Nutrition Scores:     Nutrition Assessments - 08/19/16 1551      MEDFICTS Scores   Pre Score 53      Nutrition Goals Re-Evaluation:   Nutrition Goals Re-Evaluation:   Nutrition Goals Discharge (Final Nutrition Goals Re-Evaluation):   Psychosocial: Target Goals: Acknowledge presence or absence of significant depression and/or stress, maximize coping skills, provide positive support system. Participant is able to verbalize types and ability to use techniques and skills needed for reducing stress and depression.  Initial Review & Psychosocial Screening:     Initial Psych Review & Screening - 08/19/16 1646      Initial Review   Current issues with None Identified     Family Dynamics   Good Support System? Yes     Barriers   Psychosocial barriers to participate in program The patient should benefit from training in stress management and relaxation.     Screening Interventions   Interventions Encouraged to exercise      Quality of Life Scores:     Quality of Life - 08/19/16 1610      Quality of Life Scores   Health/Function Pre 21.69 %   Socioeconomic Pre 28.58 %   Psych/Spiritual Pre 25.57 %   Family Pre 25.5 %   GLOBAL Pre 24.48 %      PHQ-9: Recent Review Flowsheet Data    Depression screen [facility] 2/9 01/08/2015 06/09/2014   Decreased Interest 0 0   Down, Depressed, Hopeless 0 0   PHQ - 2 Score 0 0     Interpretation of Total Score  Total Score Depression Severity:  1-4 = Minimal depression, 5-9 = Mild depression, 10-14 = Moderate depression, 15-19 = Moderately severe depression, 20-27 = Severe depression   Psychosocial Evaluation and Intervention:   Psychosocial Re-Evaluation:   Psychosocial Discharge (Final Psychosocial  Re-Evaluation):   Vocational Rehabilitation: Provide vocational rehab assistance to qualifying candidates.   Vocational Rehab Evaluation & Intervention:     Vocational Rehab - 08/19/16 1648      Initial Vocational Rehab Evaluation & Intervention   Assessment shows need for Vocational Rehabilitation No      Education: Education Goals: Education classes will be provided on a weekly basis, covering required topics. Participant will state understanding/return demonstration of topics presented.  Learning Barriers/Preferences:     Learning Barriers/Preferences - 08/19/16 1609      Learning Barriers/Preferences   Learning Barriers Hearing   Learning Preferences None      Education Topics: Count Your Pulse:  -Group instruction provided by verbal instruction, demonstration, patient participation and written materials to support subject.  Instructors address importance of being able to find your pulse and how to count your pulse when at home without a heart monitor.  Patients get hands on experience counting their pulse with staff help and individually.   Heart  Attack, Angina, and Risk Factor Modification:  -Group instruction provided by verbal instruction, video, and written materials to support subject.  Instructors address signs and symptoms of angina and heart attacks.    Also discuss risk factors for heart disease and how to make changes to improve heart health risk factors.   Functional Fitness:  -Group instruction provided by verbal instruction, demonstration, patient participation, and written materials to support subject.  Instructors address safety measures for doing things around the house.  Discuss how to get up and down off the floor, how to pick things up properly, how to safely get out of a chair without assistance, and balance training.   Meditation and Mindfulness:  -Group instruction provided by verbal instruction, patient participation, and written materials to  support subject.  Instructor addresses importance of mindfulness and meditation practice to help reduce stress and improve awareness.  Instructor also leads participants through a meditation exercise.    Stretching for Flexibility and Mobility:  -Group instruction provided by verbal instruction, patient participation, and written materials to support subject.  Instructors lead participants through series of stretches that are designed to increase flexibility thus improving mobility.  These stretches are additional exercise for major muscle groups that are typically performed during regular warm up and cool down.   Hands Only CPR:  -Group verbal, video, and participation provides a basic overview of AHA guidelines for community CPR. Role-play of emergencies allow participants the opportunity to practice calling for help and chest compression technique with discussion of AED use.   Hypertension: -Group verbal and written instruction that provides a basic overview of hypertension including the most recent diagnostic guidelines, risk factor reduction with self-care instructions and medication management.    Nutrition I class: Heart Healthy Eating:  -Group instruction provided by PowerPoint slides, verbal discussion, and written materials to support subject matter. The instructor gives an explanation and review of the Therapeutic Lifestyle Changes diet recommendations, which includes a discussion on lipid goals, dietary fat, sodium, fiber, plant stanol/sterol esters, sugar, and the components of a well-balanced, healthy diet.   Nutrition II class: Lifestyle Skills:  -Group instruction provided by PowerPoint slides, verbal discussion, and written materials to support subject matter. The instructor gives an explanation and review of label reading, grocery shopping for heart health, heart healthy recipe modifications, and ways to make healthier choices when eating out.   Diabetes Question & Answer:   -Group instruction provided by PowerPoint slides, verbal discussion, and written materials to support subject matter. The instructor gives an explanation and review of diabetes co-morbidities, pre- and post-prandial blood glucose goals, pre-exercise blood glucose goals, signs, symptoms, and treatment of hypoglycemia and hyperglycemia, and foot care basics.   Diabetes Blitz:  -Group instruction provided by PowerPoint slides, verbal discussion, and written materials to support subject matter. The instructor gives an explanation and review of the physiology behind type 1 and type 2 diabetes, diabetes medications and rational behind using different medications, pre- and post-prandial blood glucose recommendations and Hemoglobin A1c goals, diabetes diet, and exercise including blood glucose guidelines for exercising safely.    Portion Distortion:  -Group instruction provided by PowerPoint slides, verbal discussion, written materials, and food models to support subject matter. The instructor gives an explanation of serving size versus portion size, changes in portions sizes over the last 20 years, and what consists of a serving from each food group.   Stress Management:  -Group instruction provided by verbal instruction, video, and written materials to support subject matter.  Instructors  review role of stress in heart disease and how to cope with stress positively.     Exercising on Your Own:  -Group instruction provided by verbal instruction, power point, and written materials to support subject.  Instructors discuss benefits of exercise, components of exercise, frequency and intensity of exercise, and end points for exercise.  Also discuss use of nitroglycerin and activating EMS.  Review options of places to exercise outside of rehab.  Review guidelines for sex with heart disease.   Cardiac Drugs I:  -Group instruction provided by verbal instruction and written materials to support subject.   Instructor reviews cardiac drug classes: antiplatelets, anticoagulants, beta blockers, and statins.  Instructor discusses reasons, side effects, and lifestyle considerations for each drug class.   Cardiac Drugs II:  -Group instruction provided by verbal instruction and written materials to support subject.  Instructor reviews cardiac drug classes: angiotensin converting enzyme inhibitors (ACE-I), angiotensin II receptor blockers (ARBs), nitrates, and calcium channel blockers.  Instructor discusses reasons, side effects, and lifestyle considerations for each drug class.   Anatomy and Physiology of the Circulatory System:  Group verbal and written instruction and models provide basic cardiac anatomy and physiology, with the coronary electrical and arterial systems. Review of: AMI, Angina, Valve disease, Heart Failure, Peripheral Artery Disease, Cardiac Arrhythmia, Pacemakers, and the ICD.   Other Education:  -Group or individual verbal, written, or video instructions that support the educational goals of the cardiac rehab program.   Knowledge Questionnaire Score:     Knowledge Questionnaire Score - 08/19/16 1609      Knowledge Questionnaire Score   Pre Score 23/24      Core Components/Risk Factors/Patient Goals at Admission:     Personal Goals and Risk Factors at Admission - 08/19/16 1601      Core Components/Risk Factors/Patient Goals on Admission    Weight Management Obesity;Yes   Intervention Obesity: Provide education and appropriate resources to help participant work on and attain dietary goals.;Weight Management/Obesity: Establish reasonable short term and long term weight goals.   Admit Weight 172 lb 6.4 oz (78.2 kg)   Goal Weight: Short Term 166 lb 8 oz (75.5 kg)   Goal Weight: Long Term 162 lb 8 oz (73.7 kg)   Expected Outcomes Short Term: Continue to assess and modify interventions until short term weight is achieved;Long Term: Adherence to nutrition and physical  activity/exercise program aimed toward attainment of established weight goal;Weight Loss: Understanding of general recommendations for a balanced deficit meal plan, which promotes 1-2 lb weight loss per week and includes a negative energy balance of [phone] kcal/d   Hypertension Yes   Intervention Provide education on lifestyle modifcations including regular physical activity/exercise, weight management, moderate sodium restriction and increased consumption of fresh fruit, vegetables, and low fat dairy, alcohol moderation, and smoking cessation.;Monitor prescription use compliance.   Expected Outcomes Short Term: Continued assessment and intervention until BP is < 140/63mm HG in hypertensive participants. < 130/23mm HG in hypertensive participants with diabetes, heart failure or chronic kidney disease.;Long Term: Maintenance of blood pressure at goal levels.   Lipids Yes   Intervention Provide education and support for participant on nutrition & aerobic/resistive exercise along with prescribed medications to achieve LDL 70mg , HDL >40mg .   Expected Outcomes Short Term: Participant states understanding of desired cholesterol values and is compliant with medications prescribed. Participant is following exercise prescription and nutrition guidelines.;Long Term: Cholesterol controlled with medications as prescribed, with individualized exercise RX and with personalized nutrition plan. Value goals: LDL < 70mg ,  HDL > 40 mg.      Core Components/Risk Factors/Patient Goals Review:    Core Components/Risk Factors/Patient Goals at Discharge (Final Review):    ITP Comments:     ITP Comments    Row Name 08/19/16 1457           ITP Comments Medical Director- Dr. Fransico Him, MD          Comments:  Patient attended orientation from 1330 to 1530 to review rules and guidelines for program. Completed 6 minute walk test, Intitial ITP, and exercise prescription.  VSS. Telemetry-chronic afib   Asymptomatic. Brief psychosocial - Pt is well known to cardiac rehab from previous cardiac events.  Pt was doing the maintenance program and plans to return after participating in phase II for a month.  Pt with no barriers to participating in cardiac rehab. Pt has supportive family and friends Pt considers herself a "social butterfly" which church members and friends. Pt is looking forward to returning next week for full exercise. Cherre Huger, BSN Cardiac and Pulmonary Rehab Nurse Navigator '

## 2016-08-19 NOTE — Progress Notes (Signed)
Patty [name] 83 y.o. female       Nutrition Screen  1. 07/10/16 NSTEMI   2. 07/15/16 DES Cx    Past Medical History:  Diagnosis Date  . Chronic anticoagulation - coumadin, CHADS2VASC=6 05/17/2015  . CKD (chronic kidney disease), stage III   . Combined systolic and diastolic heart failure ([place])   . Coronary artery disease    a. s/p multiple stents - stenting x 2 to the LAD and x 1 to the LCx in 2000, rotational arthrectomy to proximal LCx and DES to LAD in 2014 and DES to LCx in 2014, along with DES to LAD for re-in-stent stenosis in 2015, and DES to ostial LAD in 2016. b. 07/2016 - orbital atherectomy & DES to mid LCx.  . Diverticulitis   . Granulosa cell carcinoma ([place])    abd; last episode was in 2009  . Hyperlipidemia   . Hypertension   . LBBB (left bundle branch block)       . Myocardial infarction ([place]) 2002  . Obesity   . OSA on CPAP   . Pacemaker failure    a. Prior leadless PPM with premature battery failure, being managed conservatively without replacement.  . Peripheral vascular disease ([place])   . Permanent atrial fibrillation ([place]) 2013  . Renal artery stenosis ([place])   . Tachycardia-bradycardia syndrome ([place])    a. s/p leadless pacemaker ([name]) implanted by Dr [name]  . Varicose veins    Meds reviewed. Coumadin noted  HT: Ht Readings from Last 1 Encounters:  07/31/16 5' (1.524 m)    WT: Wt Readings from Last 3 Encounters:  07/31/16 173 lb 1.9 oz (78.5 kg)  07/21/16 172 lb 3.2 oz (78.1 kg)  07/15/16 170 lb 6.7 oz (77.3 kg)     BMI 33.9   Current tobacco use? No  Labs:  Lipid Panel     Component Value Date/Time   CHOL 107 07/11/2016 0926   CHOL 160 02/13/2016 0855   TRIG 46 07/11/2016 0926   HDL 38 (L) 07/11/2016 0926   HDL 68 02/13/2016 0855   CHOLHDL 2.8 07/11/2016 0926   VLDL 9 07/11/2016 0926   LDLCALC 60 07/11/2016 0926   LDLCALC 75 02/13/2016 0855    Lab Results  Component Value Date   HGBA1C 6.0 (H) 05/17/2015   CBG (last 3)  No  results for input(s): GLUCAP in the last 72 hours.  Nutrition Note Pt is following Step 1 of the Therapeutic Lifestyle Changes diet.   Nutrition Diagnosis ? Food-and nutrition-related knowledge deficit related to lack of exposure to information as related to diagnosis of: ? CVD ? Pre-DM ? Obesity related to excessive energy intake as evidenced by a BMI of 33.9  Nutrition Goal(s):  Pt to identify food quantities necessary to achieve weight loss of 6-24 lb (2.7-10.9 kg) at graduation from cardiac rehab.  Plan:  Pt to attend nutrition classes ? Nutrition I ? Nutrition II ? Portion Distortion  Will provide client-centered nutrition education as part of interdisciplinary care.   Monitor and evaluate progress toward nutrition goal with team.  Derek Mound, M.Ed, RD, LDN, CDE 08/19/2016 3:48 PM

## 2016-08-21 ENCOUNTER — Ambulatory Visit (INDEPENDENT_AMBULATORY_CARE_PROVIDER_SITE_OTHER): Payer: Medicare Other

## 2016-08-21 DIAGNOSIS — Z5181 Encounter for therapeutic drug level monitoring: Secondary | ICD-10-CM

## 2016-08-21 DIAGNOSIS — I4891 Unspecified atrial fibrillation: Secondary | ICD-10-CM

## 2016-08-21 DIAGNOSIS — I200 Unstable angina: Secondary | ICD-10-CM | POA: Diagnosis not present

## 2016-08-21 LAB — POCT INR: INR: 2.9

## 2016-08-25 ENCOUNTER — Encounter (HOSPITAL_COMMUNITY)
Admission: RE | Admit: 2016-08-25 | Discharge: 2016-08-25 | Disposition: A | Discharge status: Home / Self Care | Payer: Medicare Other | Source: Ambulatory Visit | Attending: Interventional Cardiology | Admitting: Interventional Cardiology

## 2016-08-25 DIAGNOSIS — Z955 Presence of coronary angioplasty implant and graft: Secondary | ICD-10-CM

## 2016-08-25 DIAGNOSIS — I208 Other forms of angina pectoris: Secondary | ICD-10-CM | POA: Diagnosis not present

## 2016-08-25 DIAGNOSIS — I214 Non-ST elevation (NSTEMI) myocardial infarction: Secondary | ICD-10-CM

## 2016-08-25 NOTE — Progress Notes (Signed)
Daily Session Note  Patient Details  Name: Patty [name] MRN: [id number] Date of Birth: [birth year] Referring Provider:     CARDIAC REHAB PHASE II ORIENTATION from 08/19/2016 in [facility]  Referring Provider  Larae Grooms, MD      Encounter Date: 08/25/2016  Check In:   Capillary Blood Glucose: No results found for this or any previous visit (from the past 24 hour(s)).    History  Smoking Status  . Former Smoker  . Packs/day: 1.00  . Years: 32.00  . Types: Cigarettes  . Quit date: 02/03/1974  Smokeless Tobacco  . Never Used    Goals Met:  Exercise tolerated well Personal goals reviewed No report of cardiac concerns or symptoms  Goals Unmet:  Not Applicable  Comments:  Pt started cardiac rehab today.  Pt tolerated light exercise without difficulty. VSS, telemetry-Afib with neg qrs, asymptomatic.  Medication list reconciled. Pt denies barriers to medication compliance.  PSYCHOSOCIAL ASSESSMENT:  PHQ-0. Pt completed pre assessment quality of life survey. Pt scored the following     Quality of Life - 08/19/16 1610      Quality of Life Scores   Health/Function Pre 21.69 %   Socioeconomic Pre 28.58 %   Psych/Spiritual Pre 25.57 %   Family Pre 25.5 %   GLOBAL Pre 24.48 %     Pt is well known to the cardiac rehab staff.  Pt previously participated in the maintenance program after finishing the phase II program for prior cardiac event. Pt exhibits positive coping skills, hopeful outlook with supportive family. No psychosocial needs identified at this time, no psychosocial interventions necessary.    Pt enjoys social activities with church members and friends, pt likes to read and watch TV.  Pt desires to increase stamina and endurance and would like to loose 10 pounds. Pt plans to participate in phase II a short period of time perhaps a month and transfer back the self pay maintenance program.   Pt oriented to exercise equipment and  routine.  Understanding verbalized. Maurice Small RN, BSN Cardiac and Pulmonary Rehab Nurse Navigator      Dr. Fransico Him is Medical Director for Cardiac Rehab at [facility].

## 2016-08-28 DIAGNOSIS — F4323 Adjustment disorder with mixed anxiety and depressed mood: Secondary | ICD-10-CM | POA: Diagnosis not present

## 2016-08-29 ENCOUNTER — Encounter (HOSPITAL_COMMUNITY)
Admission: RE | Admit: 2016-08-29 | Discharge: 2016-08-29 | Disposition: A | Discharge status: Home / Self Care | Payer: Medicare Other | Source: Ambulatory Visit | Attending: Interventional Cardiology | Admitting: Interventional Cardiology

## 2016-08-29 DIAGNOSIS — Z955 Presence of coronary angioplasty implant and graft: Secondary | ICD-10-CM

## 2016-08-29 DIAGNOSIS — I214 Non-ST elevation (NSTEMI) myocardial infarction: Secondary | ICD-10-CM

## 2016-08-29 DIAGNOSIS — I208 Other forms of angina pectoris: Secondary | ICD-10-CM | POA: Diagnosis not present

## 2016-09-01 ENCOUNTER — Encounter (HOSPITAL_COMMUNITY)
Admission: RE | Admit: 2016-09-01 | Discharge: 2016-09-01 | Disposition: A | Discharge status: Home / Self Care | Payer: Medicare Other | Source: Ambulatory Visit | Attending: Interventional Cardiology | Admitting: Interventional Cardiology

## 2016-09-01 DIAGNOSIS — Z955 Presence of coronary angioplasty implant and graft: Secondary | ICD-10-CM

## 2016-09-01 DIAGNOSIS — I214 Non-ST elevation (NSTEMI) myocardial infarction: Secondary | ICD-10-CM

## 2016-09-01 DIAGNOSIS — I208 Other forms of angina pectoris: Secondary | ICD-10-CM | POA: Diagnosis not present

## 2016-09-03 ENCOUNTER — Encounter: Payer: Self-pay | Admitting: Interventional Cardiology

## 2016-09-03 DIAGNOSIS — H353112 Nonexudative age-related macular degeneration, right eye, intermediate dry stage: Secondary | ICD-10-CM | POA: Diagnosis not present

## 2016-09-03 DIAGNOSIS — H35362 Drusen (degenerative) of macula, left eye: Secondary | ICD-10-CM | POA: Diagnosis not present

## 2016-09-03 DIAGNOSIS — H43811 Vitreous degeneration, right eye: Secondary | ICD-10-CM | POA: Diagnosis not present

## 2016-09-03 DIAGNOSIS — H26492 Other secondary cataract, left eye: Secondary | ICD-10-CM | POA: Diagnosis not present

## 2016-09-04 DIAGNOSIS — F4323 Adjustment disorder with mixed anxiety and depressed mood: Secondary | ICD-10-CM | POA: Diagnosis not present

## 2016-09-05 ENCOUNTER — Encounter (HOSPITAL_COMMUNITY)
Admission: RE | Admit: 2016-09-05 | Discharge: 2016-09-05 | Disposition: A | Discharge status: Home / Self Care | Payer: Medicare Other | Source: Ambulatory Visit | Attending: Interventional Cardiology | Admitting: Interventional Cardiology

## 2016-09-05 DIAGNOSIS — I214 Non-ST elevation (NSTEMI) myocardial infarction: Secondary | ICD-10-CM

## 2016-09-05 DIAGNOSIS — Z955 Presence of coronary angioplasty implant and graft: Secondary | ICD-10-CM

## 2016-09-05 DIAGNOSIS — I208 Other forms of angina pectoris: Secondary | ICD-10-CM | POA: Diagnosis not present

## 2016-09-08 ENCOUNTER — Encounter (HOSPITAL_COMMUNITY)
Admission: RE | Admit: 2016-09-08 | Discharge: 2016-09-08 | Disposition: A | Discharge status: Home / Self Care | Payer: Medicare Other | Source: Ambulatory Visit | Attending: Interventional Cardiology | Admitting: Interventional Cardiology

## 2016-09-08 DIAGNOSIS — Z955 Presence of coronary angioplasty implant and graft: Secondary | ICD-10-CM

## 2016-09-08 DIAGNOSIS — I208 Other forms of angina pectoris: Secondary | ICD-10-CM | POA: Diagnosis not present

## 2016-09-08 DIAGNOSIS — I214 Non-ST elevation (NSTEMI) myocardial infarction: Secondary | ICD-10-CM

## 2016-09-09 ENCOUNTER — Telehealth: Payer: Self-pay | Admitting: Interventional Cardiology

## 2016-09-09 ENCOUNTER — Other Ambulatory Visit: Payer: Self-pay | Admitting: Interventional Cardiology

## 2016-09-09 ENCOUNTER — Other Ambulatory Visit: Payer: Self-pay

## 2016-09-09 DIAGNOSIS — I4821 Permanent atrial fibrillation: Secondary | ICD-10-CM

## 2016-09-09 MED ORDER — METOPROLOL TARTRATE 25 MG PO TABS: 25.0000 mg | ORAL_TABLET | Freq: Two times a day (BID) | ORAL | 3 refills | Status: DC

## 2016-09-09 MED ORDER — DILTIAZEM HCL ER COATED BEADS 240 MG PO CP24: 240.0000 mg | ORAL_CAPSULE | Freq: Every day | ORAL | 3 refills | Status: DC

## 2016-09-09 NOTE — Telephone Encounter (Signed)
°*  STAT* If patient is at the pharmacy, call can be transferred to refill team.   1. Which medications need to be refilled? (please list name of each medication and dose if known) Diltiazem 240mg  and Metoprolol 25mg    2. Which pharmacy/location (including street and city if local pharmacy) is medication to be sent to? [facility] on [address]   3. Do they need a 30 day or 90 day supply? [place]

## 2016-09-09 NOTE — Addendum Note (Signed)
Addended by: Marion Downer A on: 09/09/2016 11:39 AM   Modules accepted: Orders

## 2016-09-12 ENCOUNTER — Encounter (HOSPITAL_COMMUNITY)
Admission: RE | Admit: 2016-09-12 | Discharge: 2016-09-12 | Disposition: A | Discharge status: Home / Self Care | Payer: Medicare Other | Source: Ambulatory Visit | Attending: Interventional Cardiology | Admitting: Interventional Cardiology

## 2016-09-12 DIAGNOSIS — Z955 Presence of coronary angioplasty implant and graft: Secondary | ICD-10-CM

## 2016-09-12 DIAGNOSIS — I208 Other forms of angina pectoris: Secondary | ICD-10-CM | POA: Diagnosis not present

## 2016-09-12 DIAGNOSIS — I214 Non-ST elevation (NSTEMI) myocardial infarction: Secondary | ICD-10-CM

## 2016-09-12 NOTE — Progress Notes (Signed)
Reviewed home exercise guidelines with patient including endpoints, temperature precautions, target heart rate and rate of perceived exertion. [facility] in [facility] aerobics and uses the [employer] as her mode of home exercise. [facility] is [facility] with a goal of exercising 4-5 days/week. Pt voices understanding of instructions given. Sol Passer, MS, ACSM CEP

## 2016-09-15 ENCOUNTER — Encounter (HOSPITAL_COMMUNITY)
Admission: RE | Admit: 2016-09-15 | Discharge: 2016-09-15 | Disposition: A | Discharge status: Home / Self Care | Payer: Medicare Other | Source: Ambulatory Visit | Attending: Interventional Cardiology | Admitting: Interventional Cardiology

## 2016-09-15 DIAGNOSIS — Z955 Presence of coronary angioplasty implant and graft: Secondary | ICD-10-CM

## 2016-09-15 DIAGNOSIS — I214 Non-ST elevation (NSTEMI) myocardial infarction: Secondary | ICD-10-CM

## 2016-09-15 DIAGNOSIS — I208 Other forms of angina pectoris: Secondary | ICD-10-CM | POA: Diagnosis not present

## 2016-09-15 NOTE — Progress Notes (Signed)
Cardiac Individual Treatment Plan  Patient Details  Name: Patty [name] MRN: [id number] Date of Birth: 1930/12/22 Referring Provider:     CARDIAC REHAB PHASE II ORIENTATION from 08/19/2016 in [place]  Referring Provider  Larae Grooms, MD      Initial Encounter Date:    CARDIAC REHAB PHASE II ORIENTATION from 08/19/2016 in [place]  Date  08/19/16  Referring Provider  Larae Grooms, MD      Visit Diagnosis: 07/10/16 NSTEMI  07/15/16 DES Cx  Patient's Home Medications on Admission:  Current Outpatient Prescriptions:  .  aspirin 81 MG chewable tablet, Chew 1 tablet (81 mg total) by mouth daily. (Patient not taking: Reported on 08/19/2016), Disp: 30 tablet, Rfl: 0 .  atorvastatin (LIPITOR) 80 MG tablet, TAKE 1 TABLET BY MOUTH EVERY MORNING, Disp: 30 tablet, Rfl: 9 .  beta carotene w/minerals (OCUVITE) tablet, Take 1 tablet by mouth daily. , Disp: , Rfl:  .  Calcium Carb-Cholecalciferol (CALTRATE 600+D) 600-800 MG-UNIT TABS, Take 1 tablet by mouth every evening. , Disp: , Rfl:  .  clopidogrel (PLAVIX) 75 MG tablet, TAKE 1 TABLET BY MOUTH EVERY DAY WITH BREAKFAST, Disp: 30 tablet, Rfl: 5 .  Coenzyme Q-10 100 MG capsule, Take 200 mg by mouth daily. , Disp: , Rfl:  .  diltiazem (CARDIZEM CD) 120 MG 24 hr capsule, TAKE 1 CAPSULE (120 MG TOTAL) BY MOUTH AT BEDTIME., Disp: 90 capsule, Rfl: 3 .  diltiazem (CARDIZEM CD) 240 MG 24 hr capsule, Take 1 capsule (240 mg total) by mouth daily. With breakfast, Disp: 90 capsule, Rfl: 3 .  furosemide (LASIX) 40 MG tablet, Take 80 mg by mouth daily., Disp: , Rfl:  .  isosorbide mononitrate (IMDUR) 60 MG 24 hr tablet, TAKE 1 TABLET (60 MG TOTAL) BY MOUTH DAILY., Disp: 30 tablet, Rfl: 9 .  KLOR-CON M20 20 MEQ tablet, TAKE 1 TABLET (20 MEQ TOTAL) BY MOUTH DAILY., Disp: 30 tablet, Rfl: 9 .  metoprolol (LOPRESSOR) 100 MG tablet, TAKE 1 TABLET (100 MG TOTAL) BY MOUTH 2 (TWO) TIMES  DAILY., Disp: 60 tablet, Rfl: 11 .  metoprolol tartrate (LOPRESSOR) 25 MG tablet, Take 1 tablet (25 mg total) by mouth 2 (two) times daily. Please take it a long with 100mg  tablet, to make dose 125mg  twice a day, Disp: 180 tablet, Rfl: 3 .  Multiple Vitamin (MULTIVITAMIN) capsule, Take 1 capsule by mouth daily., Disp: , Rfl:  .  nitroGLYCERIN (NITROSTAT) 0.4 MG SL tablet, Place 0.4 mg under the tongue every 5 (five) minutes as needed for chest pain. 3 dose max, Disp: , Rfl:  .  Omega-3 Fatty Acids (FISH OIL PO), Take 1,200 mg by mouth daily. , Disp: , Rfl:  .  spironolactone (ALDACTONE) 25 MG tablet, Take 25 mg by mouth daily as needed (fluid). , Disp: 45 tablet, Rfl: 11 .  warfarin (COUMADIN) 2 MG tablet, Take 1 tablet (2 mg total) by mouth as directed., Disp: 30 tablet, Rfl: 3  Past Medical History: Past Medical History:  Diagnosis Date  . Chronic anticoagulation - coumadin, CHADS2VASC=6 05/17/2015  . CKD (chronic kidney disease), stage III   . Combined systolic and diastolic heart failure ([place])   . Coronary artery disease    a. s/p multiple stents - stenting x 2 to the LAD and x 1 to the LCx in 2000, rotational arthrectomy to proximal LCx and DES to LAD in 2014 and DES to LCx in 2014, along with DES  to LAD for re-in-stent stenosis in 2015, and DES to ostial LAD in 2016. b. 07/2016 - orbital atherectomy & DES to mid LCx.  . Diverticulitis   . Granulosa cell carcinoma ([place])    abd; last episode was in 2009  . Hyperlipidemia   . Hypertension   . LBBB (left bundle branch block)       . Myocardial infarction ([place]) 2002  . Obesity   . OSA on CPAP   . Pacemaker failure    a. Prior leadless PPM with premature battery failure, being managed conservatively without replacement.  . Peripheral vascular disease ([place])   . Permanent atrial fibrillation ([place]) 2013  . Renal artery stenosis ([place])   . Tachycardia-bradycardia syndrome ([place])    a. s/p leadless pacemaker ([name]) implanted by Dr [name]   . Varicose veins     Tobacco Use: History  Smoking Status  . Former Smoker  . Packs/day: 1.00  . Years: 32.00  . Types: Cigarettes  . Quit date: 02/03/1974  Smokeless Tobacco  . Never Used    Labs: Recent Review Flowsheet Data    Labs for ITP Cardiac and Pulmonary Rehab Latest Ref Rng & Units 05/14/2014 01/02/2015 05/17/2015 02/13/2016 07/11/2016   Cholestrol 0 - 200 mg/dL - 136 - 160 107   LDLCALC 0 - 99 mg/dL - 61 - 75 60   HDL >40 mg/dL - 62 - 68 38(L)   Trlycerides <150 mg/dL - 67 - 84 46   Hemoglobin A1c 4.8 - 5.6 % - - 6.0(H) - -   TCO2 0 - 100 mmol/L 24 - - - -      Capillary Blood Glucose: Lab Results  Component Value Date   GLUCAP 85 05/16/2014   GLUCAP 81 05/15/2014   GLUCAP 102 (H) 05/14/2014   GLUCAP 106 (H) 12/01/2012     Exercise Target Goals:    Exercise Program Goal: Individual exercise prescription set with THRR, safety & activity barriers. Participant demonstrates ability to understand and report RPE using BORG scale, to self-measure pulse accurately, and to acknowledge the importance of the exercise prescription.  Exercise Prescription Goal: Starting with aerobic activity 30 plus minutes a day, 3 days per week for initial exercise prescription. Provide home exercise prescription and guidelines that participant acknowledges understanding prior to discharge.  Activity Barriers & Risk Stratification:     Activity Barriers & Cardiac Risk Stratification - 08/19/16 1429      Activity Barriers & Cardiac Risk Stratification   Activity Barriers Arthritis;Assistive Device   Cardiac Risk Stratification High      6 Minute Walk:     6 Minute Walk    Row Name 08/19/16 1604         6 Minute Walk   Phase Initial     Distance 1339 feet     Walk Time 6 minutes     # of Rest Breaks 0     MPH 2.54     METS 1.61     RPE 12     VO2 Peak 5.65     Symptoms No     Resting HR 72 bpm     Resting BP 118/64     Max Ex. HR 76 bpm     Max Ex. BP 138/80      2 Minute Post BP 122/68        Oxygen Initial Assessment:   Oxygen Re-Evaluation:   Oxygen Discharge (Final Oxygen Re-Evaluation):   Initial Exercise Prescription:  Initial Exercise Prescription - 08/19/16 1600      Date of Initial Exercise RX and Referring Provider   Date 08/19/16   Referring Provider Larae Grooms, MD     Recumbant Bike   Level --   Watts --   Minutes --   METs --     NuStep   Level 2   SPM 85   Minutes 10   METs 2.5     Arm Ergometer   Level 1   Watts 15   Minutes 10   METs 2.02     Track   Laps 11   Minutes 10   METs 2.92     Prescription Details   Frequency (times per week) 2   Duration Progress to 30 minutes of continuous aerobic without signs/symptoms of physical distress     Intensity   THRR 40-80% of Max Heartrate 54-109   Ratings of Perceived Exertion 11-13   Perceived Dyspnea 0-4     Progression   Progression Continue to progress workloads to maintain intensity without signs/symptoms of physical distress.     Resistance Training   Training Prescription Yes   Weight 2lbs   Reps 10-15      Perform Capillary Blood Glucose checks as needed.  Exercise Prescription Changes:   Exercise Comments:     Exercise Comments    Row Name 09/12/16 1240           Exercise Comments Reviewed home exercise guidleines with patient. [facility] participating in a water aerobics class and using the Nustep for 30 minutes.           Exercise Goals and Review:     Exercise Goals    Row Name 08/19/16 1603             Exercise Goals   Increase Physical Activity Yes       Intervention Provide advice, education, support and counseling about physical activity/exercise needs.;Develop an individualized exercise prescription for aerobic and resistive training based on initial evaluation findings, risk stratification, comorbidities and participant's personal  goals.       Expected Outcomes Achievement of increased cardiorespiratory fitness and enhanced flexibility, muscular endurance and strength shown through measurements of functional capacity and personal statement of participant.       Increase Strength and Stamina Yes       Intervention Provide advice, education, support and counseling about physical activity/exercise needs.;Develop an individualized exercise prescription for aerobic and resistive training based on initial evaluation findings, risk stratification, comorbidities and participant's personal goals.       Expected Outcomes Achievement of increased cardiorespiratory fitness and enhanced flexibility, muscular endurance and strength shown through measurements of functional capacity and personal statement of participant.          Exercise Goals Re-Evaluation :    Discharge Exercise Prescription (Final Exercise Prescription Changes):   Nutrition:  Target Goals: Understanding of nutrition guidelines, daily intake of sodium 1500mg , cholesterol 200mg , calories 30% from fat and 7% or less from saturated fats, daily to have 5 or more servings of fruits and vegetables.  Biometrics:     Pre Biometrics - 08/19/16 1607      Pre Biometrics   Height 5' 0.5" (1.537 m)   Weight 172 lb 6.4 oz (78.2 kg)   Waist Circumference 41.5 inches   Hip Circumference 46.5 inches   Waist to Hip Ratio 0.89 %   BMI (Calculated) 33.2  Triceps Skinfold 18 mm   % Body Fat 43.8 %   Grip Strength 27.5 kg   Flexibility 13.5 in   Single Leg Stand 1.96 seconds       Nutrition Therapy Plan and Nutrition Goals:     Nutrition Therapy & Goals - 08/19/16 1551      Nutrition Therapy   Diet Therapeutic Lifestyle Changes   Drug/Food Interactions Coumadin/Vit K     Personal Nutrition Goals   Nutrition Goal Wt loss of 1-2 lb/week to a wt loss goal of 6-24 lb while in [place], educate and counsel  regarding individualized specific dietary modifications aiming towards targeted core components such as weight, hypertension, lipid management, diabetes, heart failure and other comorbidities.   Expected Outcomes Short Term Goal: Understand basic principles of dietary content, such as calories, fat, sodium, cholesterol and nutrients.;Long Term Goal: Adherence to prescribed nutrition plan.      Nutrition Discharge: Nutrition Scores:     Nutrition Assessments - 08/19/16 1551      MEDFICTS Scores   Pre Score 53      Nutrition Goals Re-Evaluation:   Nutrition Goals Re-Evaluation:   Nutrition Goals Discharge (Final Nutrition Goals Re-Evaluation):   Psychosocial: Target Goals: Acknowledge presence or absence of significant depression and/or stress, maximize coping skills, provide positive support system. Participant is able to verbalize types and ability to use techniques and skills needed for reducing stress and depression.  Initial Review & Psychosocial Screening:     Initial Psych Review & Screening - 08/19/16 1646      Initial Review   Current issues with None Identified     Family Dynamics   Good Support System? Yes     Barriers   Psychosocial barriers to participate in program The patient should benefit from training in stress management and relaxation.     Screening Interventions   Interventions Encouraged to exercise      Quality of Life Scores:     Quality of Life - 08/19/16 1610      Quality of Life Scores   Health/Function Pre 21.69 %   Socioeconomic Pre 28.58 %   Psych/Spiritual Pre 25.57 %   Family Pre 25.5 %   GLOBAL Pre 24.48 %      PHQ-9: Recent Review Flowsheet Data    Depression screen [facility] 2/9 01/08/2015 06/09/2014   Decreased Interest 0 0   Down, Depressed, Hopeless 0 0   PHQ - 2 Score 0 0     Interpretation of Total Score  Total Score Depression Severity:  1-4 = Minimal depression, 5-9 = Mild depression, 10-14 = Moderate depression,  15-19 = Moderately severe depression, 20-27 = Severe depression   Psychosocial Evaluation and Intervention:     Psychosocial Evaluation - 09/15/16 1416      Psychosocial Evaluation & Interventions   Interventions Stress management education;Encouraged to exercise with the program and follow exercise prescription;Relaxation education   Comments pt denies any needs, pt enjoys coming to the cardiac rehab program   Continue Psychosocial Services  Follow up required by staff      Psychosocial Re-Evaluation:     Psychosocial Re-Evaluation    [place] Name 08/25/16 1315 09/15/16 1417           Psychosocial Re-Evaluation   Current issues with None Identified None Identified      Interventions Stress management education;Relaxation education;Encouraged to attend Cardiac Rehabilitation for the exercise Stress management education;Relaxation education;Encouraged to  attend Cardiac Rehabilitation for the exercise      Continue Psychosocial Services  Follow up required by staff  -         Psychosocial Discharge (Final Psychosocial Re-Evaluation):     Psychosocial Re-Evaluation - 09/15/16 1417      Psychosocial Re-Evaluation   Current issues with None Identified   Interventions Stress management education;Relaxation education;Encouraged to attend Cardiac Rehabilitation for the exercise      Vocational Rehabilitation: Provide vocational rehab assistance to qualifying candidates.   Vocational Rehab Evaluation & Intervention:     Vocational Rehab - 08/19/16 1648      Initial Vocational Rehab Evaluation & Intervention   Assessment shows need for Vocational Rehabilitation No      Education: Education Goals: Education classes will be provided on a weekly basis, covering required topics. Participant will state understanding/return demonstration of topics presented.  Learning Barriers/Preferences:     Learning Barriers/Preferences - 08/19/16 1609      Learning Barriers/Preferences    Learning Barriers Hearing   Learning Preferences None      Education Topics: Count Your Pulse:  -Group instruction provided by verbal instruction, demonstration, patient participation and written materials to support subject.  Instructors address importance of being able to find your pulse and how to count your pulse when at home without a heart monitor.  Patients get hands on experience counting their pulse with staff help and individually.   Heart Attack, Angina, and Risk Factor Modification:  -Group instruction provided by verbal instruction, video, and written materials to support subject.  Instructors address signs and symptoms of angina and heart attacks.    Also discuss risk factors for heart disease and how to make changes to improve heart health risk factors.   Functional Fitness:  -Group instruction provided by verbal instruction, demonstration, patient participation, and written materials to support subject.  Instructors address safety measures for doing things around the house.  Discuss how to get up and down off the floor, how to pick things up properly, how to safely get out of a chair without assistance, and balance training.   Meditation and Mindfulness:  -Group instruction provided by verbal instruction, patient participation, and written materials to support subject.  Instructor addresses importance of mindfulness and meditation practice to help reduce stress and improve awareness.  Instructor also leads participants through a meditation exercise.    Stretching for Flexibility and Mobility:  -Group instruction provided by verbal instruction, patient participation, and written materials to support subject.  Instructors lead participants through series of stretches that are designed to increase flexibility thus improving mobility.  These stretches are additional exercise for major muscle groups that are typically performed during regular warm up and cool down.   Hands  Only CPR:  -Group verbal, video, and participation provides a basic overview of AHA guidelines for community CPR. Role-play of emergencies allow participants the opportunity to practice calling for help and chest compression technique with discussion of AED use.   Hypertension: -Group verbal and written instruction that provides a basic overview of hypertension including the most recent diagnostic guidelines, risk factor reduction with self-care instructions and medication management.   CARDIAC REHAB PHASE II EXERCISE from 09/12/2016 in [place]  Date  09/12/16  Instruction Review Code  2- meets goals/outcomes       Nutrition I class: Heart Healthy Eating:  -Group instruction provided by PowerPoint slides, verbal discussion, and written materials to support subject matter. The instructor gives an explanation  and review of the Therapeutic Lifestyle Changes diet recommendations, which includes a discussion on lipid goals, dietary fat, sodium, fiber, plant stanol/sterol esters, sugar, and the components of a well-balanced, healthy diet.   Nutrition II class: Lifestyle Skills:  -Group instruction provided by PowerPoint slides, verbal discussion, and written materials to support subject matter. The instructor gives an explanation and review of label reading, grocery shopping for heart health, heart healthy recipe modifications, and ways to make healthier choices when eating out.   Diabetes Question & Answer:  -Group instruction provided by PowerPoint slides, verbal discussion, and written materials to support subject matter. The instructor gives an explanation and review of diabetes co-morbidities, pre- and post-prandial blood glucose goals, pre-exercise blood glucose goals, signs, symptoms, and treatment of hypoglycemia and hyperglycemia, and foot care basics.   CARDIAC REHAB PHASE II EXERCISE from 09/12/2016 in [place]  Date   09/05/16  Educator  RD  Instruction Review Code  2- meets goals/outcomes      Diabetes Blitz:  -Group instruction provided by PowerPoint slides, verbal discussion, and written materials to support subject matter. The instructor gives an explanation and review of the physiology behind type 1 and type 2 diabetes, diabetes medications and rational behind using different medications, pre- and post-prandial blood glucose recommendations and Hemoglobin A1c goals, diabetes diet, and exercise including blood glucose guidelines for exercising safely.    Portion Distortion:  -Group instruction provided by PowerPoint slides, verbal discussion, written materials, and food models to support subject matter. The instructor gives an explanation of serving size versus portion size, changes in portions sizes over the last 20 years, and what consists of a serving from each food group.   Stress Management:  -Group instruction provided by verbal instruction, video, and written materials to support subject matter.  Instructors review role of stress in heart disease and how to cope with stress positively.     Exercising on Your Own:  -Group instruction provided by verbal instruction, power point, and written materials to support subject.  Instructors discuss benefits of exercise, components of exercise, frequency and intensity of exercise, and end points for exercise.  Also discuss use of nitroglycerin and activating EMS.  Review options of places to exercise outside of rehab.  Review guidelines for sex with heart disease.   Cardiac Drugs I:  -Group instruction provided by verbal instruction and written materials to support subject.  Instructor reviews cardiac drug classes: antiplatelets, anticoagulants, beta blockers, and statins.  Instructor discusses reasons, side effects, and lifestyle considerations for each drug class.   Cardiac Drugs II:  -Group instruction provided by verbal instruction and written  materials to support subject.  Instructor reviews cardiac drug classes: angiotensin converting enzyme inhibitors (ACE-I), angiotensin II receptor blockers (ARBs), nitrates, and calcium channel blockers.  Instructor discusses reasons, side effects, and lifestyle considerations for each drug class.   Anatomy and Physiology of the Circulatory System:  Group verbal and written instruction and models provide basic cardiac anatomy and physiology, with the coronary electrical and arterial systems. Review of: AMI, Angina, Valve disease, Heart Failure, Peripheral Artery Disease, Cardiac Arrhythmia, Pacemakers, and the ICD.   Other Education:  -Group or individual verbal, written, or video instructions that support the educational goals of the cardiac rehab program.   Knowledge Questionnaire Score:     Knowledge Questionnaire Score - 08/19/16 1609      Knowledge Questionnaire Score   Pre Score 23/24      Core Components/Risk Factors/Patient Goals at  Admission:     Personal Goals and Risk Factors at Admission - 08/19/16 1601      Core Components/Risk Factors/Patient Goals on Admission    Weight Management Obesity;Yes   Intervention Obesity: Provide education and appropriate resources to help participant work on and attain dietary goals.;Weight Management/Obesity: Establish reasonable short term and long term weight goals.   Admit Weight 172 lb 6.4 oz (78.2 kg)   Goal Weight: Short Term 166 lb 8 oz (75.5 kg)   Goal Weight: Long Term 162 lb 8 oz (73.7 kg)   Expected Outcomes Short Term: Continue to assess and modify interventions until short term weight is achieved;Long Term: Adherence to nutrition and physical activity/exercise program aimed toward attainment of established weight goal;Weight Loss: Understanding of general recommendations for a balanced deficit meal plan, which promotes 1-2 lb weight loss per week and includes a negative energy balance of [phone] kcal/d   Hypertension Yes    Intervention Provide education on lifestyle modifcations including regular physical activity/exercise, weight management, moderate sodium restriction and increased consumption of fresh fruit, vegetables, and low fat dairy, alcohol moderation, and smoking cessation.;Monitor prescription use compliance.   Expected Outcomes Short Term: Continued assessment and intervention until BP is < 140/45mm HG in hypertensive participants. < 130/93mm HG in hypertensive participants with diabetes, heart failure or chronic kidney disease.;Long Term: Maintenance of blood pressure at goal levels.   Lipids Yes   Intervention Provide education and support for participant on nutrition & aerobic/resistive exercise along with prescribed medications to achieve LDL 70mg , HDL >40mg .   Expected Outcomes Short Term: Participant states understanding of desired cholesterol values and is compliant with medications prescribed. Participant is following exercise prescription and nutrition guidelines.;Long Term: Cholesterol controlled with medications as prescribed, with individualized exercise RX and with personalized nutrition plan. Value goals: LDL < 70mg , HDL > 40 mg.      Core Components/Risk Factors/Patient Goals Review:      Goals and Risk Factor Review    Row Name 09/15/16 1415             Core Components/Risk Factors/Patient Goals Review   Personal Goals Review Weight Management/Obesity;Lipids;Hypertension       Review Pt is off to a good start with the the reduction of modifiable risk factors.       Expected Outcomes Pt will demonstrate and/or make progress toward achieving the desired weight , lipid reading and blood pressure within normal limits.          Core Components/Risk Factors/Patient Goals at Discharge (Final Review):      Goals and Risk Factor Review - 09/15/16 1415      Core Components/Risk Factors/Patient Goals Review   Personal Goals Review Weight Management/Obesity;Lipids;Hypertension   Review  Pt is off to a good start with the the reduction of modifiable risk factors.   Expected Outcomes Pt will demonstrate and/or make progress toward achieving the desired weight , lipid reading and blood pressure within normal limits.      ITP Comments:     ITP Comments    Row Name 08/19/16 1457           ITP Comments Medical Director- Dr. Fransico Him, MD          Comments:  [place] is making expected progress toward personal goals after completing 9 sessions. Psychosocial Assessment - Pt is well known to cardiac rehab from previous cardiac events. Pt with no barriers to participating in cardiac rehab. Pt has supportive family and friends Pt considers  herself a "social butterfly" which church members and friends. Pt encourages new participants . Recommend continued exercise and life style modification education including  stress management and relaxation techniques to decrease cardiac risk profile. Cherre Huger, BSN Cardiac and Training and development officer

## 2016-09-18 ENCOUNTER — Ambulatory Visit (INDEPENDENT_AMBULATORY_CARE_PROVIDER_SITE_OTHER): Payer: Medicare Other | Admitting: *Deleted

## 2016-09-18 DIAGNOSIS — Z5181 Encounter for therapeutic drug level monitoring: Secondary | ICD-10-CM

## 2016-09-18 DIAGNOSIS — I4891 Unspecified atrial fibrillation: Secondary | ICD-10-CM | POA: Diagnosis not present

## 2016-09-18 DIAGNOSIS — I200 Unstable angina: Secondary | ICD-10-CM | POA: Diagnosis not present

## 2016-09-18 LAB — POCT INR: INR: 2.7

## 2016-09-19 ENCOUNTER — Encounter (HOSPITAL_COMMUNITY): Payer: Medicare Other

## 2016-09-20 ENCOUNTER — Other Ambulatory Visit: Payer: Self-pay | Admitting: Interventional Cardiology

## 2016-09-22 ENCOUNTER — Encounter (HOSPITAL_COMMUNITY)
Admission: RE | Admit: 2016-09-22 | Discharge: 2016-09-22 | Disposition: A | Discharge status: Home / Self Care | Payer: Medicare Other | Source: Ambulatory Visit | Attending: Interventional Cardiology | Admitting: Interventional Cardiology

## 2016-09-22 DIAGNOSIS — I208 Other forms of angina pectoris: Secondary | ICD-10-CM | POA: Diagnosis not present

## 2016-09-22 DIAGNOSIS — Z955 Presence of coronary angioplasty implant and graft: Secondary | ICD-10-CM

## 2016-09-22 DIAGNOSIS — I214 Non-ST elevation (NSTEMI) myocardial infarction: Secondary | ICD-10-CM

## 2016-09-24 DIAGNOSIS — M109 Gout, unspecified: Secondary | ICD-10-CM | POA: Diagnosis not present

## 2016-09-26 ENCOUNTER — Ambulatory Visit: Payer: Medicare Other | Admitting: Podiatry

## 2016-09-26 ENCOUNTER — Encounter (HOSPITAL_COMMUNITY)
Admission: RE | Admit: 2016-09-26 | Discharge: 2016-09-26 | Disposition: A | Discharge status: Home / Self Care | Payer: Medicare Other | Source: Ambulatory Visit | Attending: Interventional Cardiology | Admitting: Interventional Cardiology

## 2016-09-26 ENCOUNTER — Telehealth: Payer: Self-pay | Admitting: Internal Medicine

## 2016-09-26 DIAGNOSIS — Z955 Presence of coronary angioplasty implant and graft: Secondary | ICD-10-CM

## 2016-09-26 DIAGNOSIS — I214 Non-ST elevation (NSTEMI) myocardial infarction: Secondary | ICD-10-CM

## 2016-09-26 DIAGNOSIS — I208 Other forms of angina pectoris: Secondary | ICD-10-CM | POA: Diagnosis not present

## 2016-09-26 NOTE — Telephone Encounter (Signed)
New message    Pt states she wants to remind Dr. [name] to call the skin surgery center and remind them about her high risk bleeding. She wants this done before her surgery is scheduled on September 6th.

## 2016-09-29 ENCOUNTER — Encounter (HOSPITAL_COMMUNITY): Payer: Medicare Other

## 2016-09-29 NOTE — Telephone Encounter (Signed)
Follow up    Pt is calling to follow up on this message. Please call pt.

## 2016-09-30 NOTE — Telephone Encounter (Signed)
The squamous cell being removed is on her lip and she is worried that she will have excessive bleeding like when area was biospied.  She says Dr [name] told her she was at high risk for bleeding( Warfarin and Plavix) and that if she would remind him prior to the removal he would call the MD to discuss.  She is wanting Dr [name] to talk with Dr Harvel Quale at [facility] [phone] as he discussed with her.  Her surgery is on 10/09/16 at 8:00am.  She will have her daughter with her this time.  She had a very "tramatic" experience with Dr Drucie Ip office when it was biopsied(lots of bleeding) and Dr [name] told her he would speak with the MD prior to it being removed.  I also let her know Dr [name] said if MD doing said it was okay to proceed on all blood thinners he was good with that.  She says she would feel better if Dr. [name] spoke with MD.    I let her know I would forward to Dr [name] for recommendations.

## 2016-10-03 ENCOUNTER — Encounter (HOSPITAL_COMMUNITY)
Admission: RE | Admit: 2016-10-03 | Discharge: 2016-10-03 | Disposition: A | Discharge status: Home / Self Care | Payer: Medicare Other | Source: Ambulatory Visit | Attending: Interventional Cardiology | Admitting: Interventional Cardiology

## 2016-10-03 DIAGNOSIS — Z955 Presence of coronary angioplasty implant and graft: Secondary | ICD-10-CM

## 2016-10-03 DIAGNOSIS — I214 Non-ST elevation (NSTEMI) myocardial infarction: Secondary | ICD-10-CM

## 2016-10-03 DIAGNOSIS — I208 Other forms of angina pectoris: Secondary | ICD-10-CM | POA: Diagnosis not present

## 2016-10-03 NOTE — Telephone Encounter (Signed)
°  Follow Up  Calling to follow up on previous mess. regarding upcoming surgery. Please call.

## 2016-10-04 NOTE — Telephone Encounter (Signed)
I am happy to speak with Dr Harvel Quale next week.  Ultimately, I will defer to Dr Harvel Quale.  Thompson Grayer MD, [facility] 10/04/2016 2:39 PM

## 2016-10-07 NOTE — Telephone Encounter (Signed)
Spoke with patient this morning and let her know Dr Jackalyn Lombard recommendations.  She understands that Dr [name] will speak with Dr Harvel Quale tomorrow and I will call her after they speak.  She is having her INR checked tomorrow and will stop by to see Dr [name] and me.  She is feeling better about staying on blood thinners after reading all the materials the surgeon sent home with her.  She was very appreciative of my call.

## 2016-10-08 ENCOUNTER — Ambulatory Visit (INDEPENDENT_AMBULATORY_CARE_PROVIDER_SITE_OTHER): Payer: Medicare Other | Admitting: *Deleted

## 2016-10-08 DIAGNOSIS — I483 Typical atrial flutter: Secondary | ICD-10-CM

## 2016-10-08 DIAGNOSIS — Z5181 Encounter for therapeutic drug level monitoring: Secondary | ICD-10-CM | POA: Diagnosis not present

## 2016-10-08 DIAGNOSIS — I4891 Unspecified atrial fibrillation: Secondary | ICD-10-CM

## 2016-10-08 LAB — POCT INR: INR: 3

## 2016-10-09 DIAGNOSIS — C4402 Squamous cell carcinoma of skin of lip: Secondary | ICD-10-CM | POA: Diagnosis not present

## 2016-10-10 ENCOUNTER — Encounter (HOSPITAL_COMMUNITY): Payer: Medicare Other

## 2016-10-13 ENCOUNTER — Encounter (HOSPITAL_COMMUNITY): Payer: Medicare Other

## 2016-10-15 ENCOUNTER — Telehealth: Payer: Self-pay | Admitting: Internal Medicine

## 2016-10-15 NOTE — Telephone Encounter (Signed)
New message   Pt verbalized that her lip surgery has went well and she is healing nicely

## 2016-10-17 ENCOUNTER — Encounter (HOSPITAL_COMMUNITY): Payer: Medicare Other

## 2016-10-20 ENCOUNTER — Encounter (HOSPITAL_COMMUNITY): Payer: Medicare Other

## 2016-10-20 DIAGNOSIS — S01501A Unspecified open wound of lip, initial encounter: Secondary | ICD-10-CM | POA: Diagnosis not present

## 2016-10-23 ENCOUNTER — Telehealth (HOSPITAL_COMMUNITY): Payer: Self-pay | Admitting: *Deleted

## 2016-10-23 DIAGNOSIS — F4323 Adjustment disorder with mixed anxiety and depressed mood: Secondary | ICD-10-CM | POA: Diagnosis not present

## 2016-10-23 NOTE — Telephone Encounter (Signed)
Called and left message checking on pt well being after having squamous cell carcinoma removed.  Inquired when pt would like to transition back to the maintenance program.  Pt has completed 12 sessions and has been stable during her participation in phase II program. Contact information provided. Cherre Huger, BSN Cardiac and Training and development officer

## 2016-10-24 ENCOUNTER — Encounter (HOSPITAL_COMMUNITY)
Admission: RE | Admit: 2016-10-24 | Discharge: 2016-10-24 | Disposition: A | Discharge status: Home / Self Care | Payer: Medicare Other | Source: Ambulatory Visit | Attending: Interventional Cardiology | Admitting: Interventional Cardiology

## 2016-10-24 DIAGNOSIS — Z955 Presence of coronary angioplasty implant and graft: Secondary | ICD-10-CM

## 2016-10-24 DIAGNOSIS — I208 Other forms of angina pectoris: Secondary | ICD-10-CM | POA: Diagnosis not present

## 2016-10-24 DIAGNOSIS — I214 Non-ST elevation (NSTEMI) myocardial infarction: Secondary | ICD-10-CM

## 2016-10-27 ENCOUNTER — Encounter (HOSPITAL_COMMUNITY)
Admission: RE | Admit: 2016-10-27 | Discharge: 2016-10-27 | Disposition: A | Discharge status: Home / Self Care | Payer: Medicare Other | Source: Ambulatory Visit | Attending: Interventional Cardiology | Admitting: Interventional Cardiology

## 2016-10-27 DIAGNOSIS — I214 Non-ST elevation (NSTEMI) myocardial infarction: Secondary | ICD-10-CM

## 2016-10-27 DIAGNOSIS — Z955 Presence of coronary angioplasty implant and graft: Secondary | ICD-10-CM

## 2016-10-27 DIAGNOSIS — I208 Other forms of angina pectoris: Secondary | ICD-10-CM | POA: Diagnosis not present

## 2016-10-29 DIAGNOSIS — F4323 Adjustment disorder with mixed anxiety and depressed mood: Secondary | ICD-10-CM | POA: Diagnosis not present

## 2016-10-31 ENCOUNTER — Encounter (HOSPITAL_COMMUNITY)
Admission: RE | Admit: 2016-10-31 | Discharge: 2016-10-31 | Disposition: A | Discharge status: Home / Self Care | Payer: Medicare Other | Source: Ambulatory Visit | Attending: Interventional Cardiology | Admitting: Interventional Cardiology

## 2016-10-31 DIAGNOSIS — I214 Non-ST elevation (NSTEMI) myocardial infarction: Secondary | ICD-10-CM

## 2016-10-31 DIAGNOSIS — Z955 Presence of coronary angioplasty implant and graft: Secondary | ICD-10-CM

## 2016-10-31 DIAGNOSIS — I208 Other forms of angina pectoris: Secondary | ICD-10-CM | POA: Diagnosis not present

## 2016-10-31 NOTE — Progress Notes (Signed)
Patty [name] 83 y.o. female  Nutrition Note Spoke with pt. Nutrition Plan and Nutrition Survey goals reviewed with pt. Pt is following Step 1 of the Therapeutic Lifestyle Changes diet. Pt is pre-diabetic. Pt is aware of her family h/o DM and tries to make food choices consistent with DM diet. Age-appropriate nutrition recommendations discussed. Pt expressed understanding of the information reviewed. Pt aware of nutrition education classes offered and has previously attended nutrition classes.  Nutrition Diagnosis ? Food-and nutrition-related knowledge deficit related to lack of exposure to information as related to diagnosis of: ? CVD ? Pre-DM ? Obesity related to excessive energy intake as evidenced by a BMI of 33.9  Nutrition Intervention ?  Pt's individual nutrition plan reviewed with pt. ? Benefits of adopting Therapeutic Lifestyle Changes discussed when Medficts reviewed.    Nutrition Goal(s):  Pt to identify food quantities necessary to achieve weight loss of 6-24 lb (2.7-10.9 kg) at graduation from cardiac rehab.   Plan:  Pt to attend nutrition classes ? Nutrition I ? Nutrition II ? Portion Distortion ? Diabetes Q & A - met 09/05/16 Will provide client-centered nutrition education as part of interdisciplinary care.   Monitor and evaluate progress toward nutrition goal with team.  Derek Mound, M.Ed, RD, LDN, CDE 10/31/2016 12:10 PM

## 2016-11-02 DIAGNOSIS — I214 Non-ST elevation (NSTEMI) myocardial infarction: Secondary | ICD-10-CM | POA: Diagnosis not present

## 2016-11-02 DIAGNOSIS — N183 Chronic kidney disease, stage 3 (moderate): Secondary | ICD-10-CM | POA: Diagnosis not present

## 2016-11-02 DIAGNOSIS — I481 Persistent atrial fibrillation: Secondary | ICD-10-CM | POA: Diagnosis not present

## 2016-11-02 DIAGNOSIS — I252 Old myocardial infarction: Secondary | ICD-10-CM | POA: Diagnosis not present

## 2016-11-02 DIAGNOSIS — E782 Mixed hyperlipidemia: Secondary | ICD-10-CM | POA: Diagnosis not present

## 2016-11-02 DIAGNOSIS — I5023 Acute on chronic systolic (congestive) heart failure: Secondary | ICD-10-CM | POA: Diagnosis not present

## 2016-11-02 DIAGNOSIS — I2510 Atherosclerotic heart disease of native coronary artery without angina pectoris: Secondary | ICD-10-CM | POA: Diagnosis not present

## 2016-11-02 DIAGNOSIS — I10 Essential (primary) hypertension: Secondary | ICD-10-CM | POA: Diagnosis not present

## 2016-11-02 DIAGNOSIS — I5042 Chronic combined systolic (congestive) and diastolic (congestive) heart failure: Secondary | ICD-10-CM | POA: Diagnosis not present

## 2016-11-03 ENCOUNTER — Encounter (HOSPITAL_COMMUNITY): Payer: Medicare Other

## 2016-11-03 ENCOUNTER — Encounter (HOSPITAL_COMMUNITY)
Admission: RE | Admit: 2016-11-03 | Discharge: 2016-11-03 | Disposition: A | Discharge status: Home / Self Care | Payer: Medicare Other | Source: Ambulatory Visit | Attending: Interventional Cardiology | Admitting: Interventional Cardiology

## 2016-11-03 ENCOUNTER — Encounter: Payer: Self-pay | Admitting: Internal Medicine

## 2016-11-03 ENCOUNTER — Ambulatory Visit (INDEPENDENT_AMBULATORY_CARE_PROVIDER_SITE_OTHER): Payer: Medicare Other | Admitting: *Deleted

## 2016-11-03 ENCOUNTER — Ambulatory Visit (INDEPENDENT_AMBULATORY_CARE_PROVIDER_SITE_OTHER): Payer: Medicare Other | Admitting: Internal Medicine

## 2016-11-03 VITALS — BP 124/74 | HR 52 | Ht 60.0 in | Wt 171.1 lb

## 2016-11-03 VITALS — Ht 60.5 in | Wt 172.4 lb

## 2016-11-03 DIAGNOSIS — I4891 Unspecified atrial fibrillation: Secondary | ICD-10-CM | POA: Diagnosis not present

## 2016-11-03 DIAGNOSIS — Z5181 Encounter for therapeutic drug level monitoring: Secondary | ICD-10-CM

## 2016-11-03 DIAGNOSIS — I2510 Atherosclerotic heart disease of native coronary artery without angina pectoris: Secondary | ICD-10-CM | POA: Diagnosis not present

## 2016-11-03 DIAGNOSIS — I200 Unstable angina: Secondary | ICD-10-CM

## 2016-11-03 DIAGNOSIS — I208 Other forms of angina pectoris: Secondary | ICD-10-CM | POA: Insufficient documentation

## 2016-11-03 DIAGNOSIS — Z955 Presence of coronary angioplasty implant and graft: Secondary | ICD-10-CM

## 2016-11-03 DIAGNOSIS — I214 Non-ST elevation (NSTEMI) myocardial infarction: Secondary | ICD-10-CM

## 2016-11-03 DIAGNOSIS — I495 Sick sinus syndrome: Secondary | ICD-10-CM | POA: Diagnosis not present

## 2016-11-03 DIAGNOSIS — I4819 Other persistent atrial fibrillation: Secondary | ICD-10-CM

## 2016-11-03 DIAGNOSIS — I481 Persistent atrial fibrillation: Secondary | ICD-10-CM

## 2016-11-03 LAB — POCT INR: INR: 3.7

## 2016-11-03 NOTE — Progress Notes (Signed)
PCP: [name], MD Primary Cardiologist: Dr Irish Lack Primary EP: Dr Aurea Graff Patty [name] is a 83 y.o. female who presents today for routine electrophysiology followup.  Since last being seen in our clinic, the patient reports doing very well.  She has some fatigue later in the day.  Heart rates are mostly 40s-50s.  She has not had recent RVR.  Today, she denies symptoms of palpitations, chest pain, shortness of breath,  lower extremity edema, dizziness, presyncope, or syncope.  The patient is otherwise without complaint today.   Past Medical History:  Diagnosis Date  . Chronic anticoagulation - coumadin, CHADS2VASC=6 05/17/2015  . CKD (chronic kidney disease), stage III ([place])   . Combined systolic and diastolic heart failure ([place])   . Coronary artery disease    a. s/p multiple stents - stenting x 2 to the LAD and x 1 to the LCx in 2000, rotational arthrectomy to proximal LCx and DES to LAD in 2014 and DES to LCx in 2014, along with DES to LAD for re-in-stent stenosis in 2015, and DES to ostial LAD in 2016. b. 07/2016 - orbital atherectomy & DES to mid LCx.  . Diverticulitis   . Granulosa cell carcinoma ([place])    abd; last episode was in 2009  . Hyperlipidemia   . Hypertension   . LBBB (left bundle branch block)       . Myocardial infarction ([place]) 2002  . Obesity   . OSA on CPAP   . Pacemaker failure    a. Prior leadless PPM with premature battery failure, being managed conservatively without replacement.  . Peripheral vascular disease ([place])   . Permanent atrial fibrillation ([place]) 2013  . Renal artery stenosis ([place])   . Tachycardia-bradycardia syndrome ([place])    a. s/p leadless pacemaker ([name]) implanted by Dr [name]  . Varicose veins    Past Surgical History:  Procedure Laterality Date  . ABDOMINAL HYSTERECTOMY    . CARDIAC CATHETERIZATION  09/03/2007   EF 70%; Failed attempt at PCI to OM  . CARDIAC CATHETERIZATION  11/01/2003   EF 70%  . CARDIAC CATHETERIZATION N/A  12/14/2015   Procedure: Left Heart Cath and Coronary Angiography;  Surgeon: Burnell Blanks, MD;  Location: [place] CV LAB;  Service: Cardiovascular;  Laterality: N/A;  . CARDIOVERSION  12/31/2011   Procedure: CARDIOVERSION;  Surgeon: [name], MD;  Location: [facility] ENDOSCOPY;  Service: Cardiovascular;  Laterality: N/A;  . CARDIOVERSION N/A 12/31/2011   Procedure: CARDIOVERSION;  Surgeon: [name], MD;  Location: [facility] CATH LAB;  Service: Cardiovascular;  Laterality: N/A;  . CATARACT EXTRACTION, BILATERAL  2015  . CHOLECYSTECTOMY  1980's  . COLON SURGERY  2004   colectomy for diverticulosis  . CORONARY ANGIOPLASTY WITH STENT PLACEMENT  2000; 08/11/2012; 11/12/2012   3 + 2 LAD & CFX; 2nd CFX stent 11/12/2012  . CORONARY ATHERECTOMY N/A 07/15/2016   Procedure: Coronary Atherectomy;  Surgeon: Martinique, Peter M, MD;  Location: [place] CV LAB;  Service: Cardiovascular;  Laterality: N/A;  . CORONARY BALLOON ANGIOPLASTY N/A 07/14/2016   Procedure: Coronary Balloon Angioplasty;  Surgeon: Martinique, Peter M, MD;  Location: [place] CV LAB;  Service: Cardiovascular;  Laterality: N/A;  . CORONARY STENT INTERVENTION N/A 07/15/2016   Procedure: Coronary Stent Intervention;  Surgeon: Martinique, Peter M, MD;  Location: [place] CV LAB;  Service: Cardiovascular;  Laterality: N/A;  . FRACTIONAL FLOW RESERVE WIRE  10/07/2013   Procedure: FRACTIONAL FLOW RESERVE WIRE;  Surgeon: Charlann Lange  Irish Lack, MD;  Location: [facility] CATH LAB;  Service: Cardiovascular;;  . granulosa tumor excision  2000; 2003; 2004; 2007   "all in my abdomen including small intestines, outside my ?uterus/etc" (08/11/2012)  . HERNIA REPAIR  2005   "laparoscopic"  . LEFT HEART CATH AND CORONARY ANGIOGRAPHY N/A 07/14/2016   Procedure: Left Heart Cath and Coronary Angiography;  Surgeon: Martinique, Peter M, MD;  Location: [place] CV LAB;  Service: Cardiovascular;  Laterality: N/A;  . LEFT HEART CATHETERIZATION WITH CORONARY  ANGIOGRAM N/A 11/12/2012   Procedure: LEFT HEART CATHETERIZATION WITH CORONARY ANGIOGRAM;  Surgeon: [name], MD;  Location: [facility] CATH LAB;  Service: Cardiovascular;  Laterality: N/A;  . LEFT HEART CATHETERIZATION WITH CORONARY ANGIOGRAM N/A 10/07/2013   Procedure: LEFT HEART CATHETERIZATION WITH CORONARY ANGIOGRAM;  Surgeon: [name], MD;  Location: [facility] CATH LAB;  Service: Cardiovascular;  Laterality: N/A;  . LEFT HEART CATHETERIZATION WITH CORONARY ANGIOGRAM N/A 12/14/2013   Procedure: LEFT HEART CATHETERIZATION WITH CORONARY ANGIOGRAM;  Surgeon: Sinclair Grooms, MD;  Location: [facility] CATH LAB;  Service: Cardiovascular;  Laterality: N/A;  . LEFT HEART CATHETERIZATION WITH CORONARY ANGIOGRAM N/A 05/16/2014   Procedure: LEFT HEART CATHETERIZATION WITH CORONARY ANGIOGRAM;  Surgeon: Sherren Mocha, MD;  Location: [facility] CATH LAB;  Service: Cardiovascular;  Laterality: N/A;  . PERCUTANEOUS CORONARY INTERVENTION-BALLOON ONLY  08/04/2012   Procedure: PERCUTANEOUS CORONARY INTERVENTION-BALLOON ONLY;  Surgeon: [name], MD;  Location: [facility] CATH LAB;  Service: Cardiovascular;;  . PERCUTANEOUS CORONARY ROTOBLATOR INTERVENTION (PCI-R) N/A 08/11/2012   Procedure: PERCUTANEOUS CORONARY ROTOBLATOR INTERVENTION (PCI-R);  Surgeon: [name], MD;  Location: [facility] CATH LAB;  Service: Cardiovascular;  Laterality: N/A;  . PERCUTANEOUS CORONARY STENT INTERVENTION (PCI-S)  10/07/2013   Procedure: PERCUTANEOUS CORONARY STENT INTERVENTION (PCI-S);  Surgeon: [name], MD;  Location: [facility] CATH LAB;  Service: Cardiovascular;;  . PERMANENT PACEMAKER INSERTION N/A 03/16/2012   [name] (SJM) leadless pacemaker (LEADLESS II STUDY PATEINT)  . SALIVARY GLAND SURGERY  2000's   "had a little lump removed; granulosa related; it was benign" (08/11/2012)  . TEE WITHOUT CARDIOVERSION  12/31/2011   Procedure: TRANSESOPHAGEAL ECHOCARDIOGRAM (TEE);  Surgeon: [name], MD;  Location: [place];   Service: Cardiovascular;  Laterality: N/A;  . VARICOSE VEIN SURGERY Bilateral [place] are reviewed and negatives except as per HPI above  Current Outpatient Prescriptions  Medication Sig Dispense Refill  . atorvastatin (LIPITOR) 80 MG tablet TAKE 1 TABLET BY MOUTH EVERY MORNING 30 tablet 9  . beta carotene w/minerals (OCUVITE) tablet Take 1 tablet by mouth daily.     . Calcium Carb-Cholecalciferol (CALTRATE 600+D) 600-800 MG-UNIT TABS Take 1 tablet by mouth every evening.     . clopidogrel (PLAVIX) 75 MG tablet TAKE 1 TABLET BY MOUTH EVERY DAY WITH BREAKFAST 30 tablet 5  . Coenzyme Q-10 100 MG capsule Take 200 mg by mouth daily.     Marland Kitchen diltiazem (CARDIZEM CD) 120 MG 24 hr capsule TAKE 1 CAPSULE (120 MG TOTAL) BY MOUTH AT BEDTIME. 90 capsule 3  . diltiazem (CARDIZEM CD) 240 MG 24 hr capsule Take 1 capsule (240 mg total) by mouth daily. With breakfast 90 capsule 3  . furosemide (LASIX) 40 MG tablet Take 80 mg by mouth daily.    . isosorbide mononitrate (IMDUR) 60 MG 24 hr tablet TAKE 1 TABLET (60 MG TOTAL) BY MOUTH DAILY. 30 tablet 9  . KLOR-CON M20 20 MEQ tablet TAKE 1 TABLET (20 MEQ TOTAL)  BY MOUTH DAILY. 30 tablet 9  . metoprolol (LOPRESSOR) 100 MG tablet TAKE 1 TABLET (100 MG TOTAL) BY MOUTH 2 (TWO) TIMES DAILY. 60 tablet 11  . metoprolol tartrate (LOPRESSOR) 25 MG tablet Take 1 tablet (25 mg total) by mouth 2 (two) times daily. Please take it a long with 100mg  tablet, to make dose 125mg  twice a day 180 tablet 3  . Multiple Vitamin (MULTIVITAMIN) capsule Take 1 capsule by mouth daily.    . nitroGLYCERIN (NITROSTAT) 0.4 MG SL tablet Place 0.4 mg under the tongue every 5 (five) minutes as needed for chest pain. 3 dose max    . Omega-3 Fatty Acids (FISH OIL PO) Take 1,200 mg by mouth daily.     Marland Kitchen spironolactone (ALDACTONE) 25 MG tablet Take 25 mg by mouth daily as needed (fluid).  45 tablet 11  . warfarin (COUMADIN) 2 MG tablet Take 1 tablet (2 mg total) by mouth as directed.  30 tablet 3   No current facility-administered medications for this visit.     Physical Exam: Vitals:   11/03/16 1139  BP: 124/74  Pulse: (!) 52  SpO2: 96%  Weight: 171 lb 1.9 oz (77.6 kg)  Height: 5' (1.524 m)    GEN- The patient is well appearing, alert and oriented x 3 today.   Head- normocephalic, atraumatic Eyes-  Sclera clear, conjunctiva pink Ears- hearing intact Oropharynx- clear Lungs- Clear to ausculation bilaterally, normal work of breathing Heart- bradycardic irregular rhythm, no murmurs, rubs or gallops, PMI not laterally displaced GI- soft, NT, ND, + BS Extremities- no clubbing, cyanosis, or edema  EKG tracing ordered today is personally reviewed and shows coarse afib,  V rate 52 bpm, IVCD  Assessment and Plan:  1. Persistent afib/ atypical atrial flutter Rate controlled On coumadin  2. Bradycardia Reduce metoprolol to 100mg  BID Consider reducing diltiazem upon follow-up with Dr Irish Lack next week if heart rates are still well controlled She is clear that she is not interested in Leadless pacemaker removal or PPM implant at this time.  Given paucity of symptoms, I think that this is very reasonable.  3. CAD Stable Defer antiplatelet therapy to Dr Irish Lack  4. HTN Stable No change required today  Return to see me in 4 months  Thompson Grayer MD, [facility] 11/03/2016 12:12 PM

## 2016-11-03 NOTE — Patient Instructions (Addendum)
Medication Instructions:  Your physician has recommended you make the following change in your medication:  1) DECREASE Metoprolol to 100 mg twice daily    Labwork: None ordered   Testing/Procedures: None ordered   Follow-Up: Your physician wants you to follow-up in: 4 months with Dr. [name]   Any Other Special Instructions Will Be Listed Below (If Applicable).     If you need a refill on your cardiac medications before your next appointment, please call your pharmacy.

## 2016-11-06 DIAGNOSIS — F4323 Adjustment disorder with mixed anxiety and depressed mood: Secondary | ICD-10-CM | POA: Diagnosis not present

## 2016-11-07 ENCOUNTER — Encounter (HOSPITAL_COMMUNITY)
Admission: RE | Admit: 2016-11-07 | Discharge: 2016-11-07 | Disposition: A | Discharge status: Home / Self Care | Payer: Medicare Other | Source: Ambulatory Visit | Attending: Interventional Cardiology | Admitting: Interventional Cardiology

## 2016-11-07 DIAGNOSIS — Z955 Presence of coronary angioplasty implant and graft: Secondary | ICD-10-CM

## 2016-11-07 DIAGNOSIS — I208 Other forms of angina pectoris: Secondary | ICD-10-CM | POA: Diagnosis not present

## 2016-11-07 DIAGNOSIS — I214 Non-ST elevation (NSTEMI) myocardial infarction: Secondary | ICD-10-CM

## 2016-11-07 NOTE — Progress Notes (Signed)
Discharge Progress Report  Patient Details  Name: Patty [name] MRN: [id number] Date of Birth: 02/11/1930 Referring Provider:     CARDIAC REHAB PHASE II ORIENTATION from 08/19/2016 in [facility]  Referring Provider  Larae Grooms, MD       Number of Visits: 16 7/17 to 10/5 pt attended 2 x week  Reason for Discharge:  Patient reached a stable level of exercise. Patient independent in their exercise. Patient has met program and personal goals.  Smoking History:  History  Smoking Status  . Former Smoker  . Packs/day: 1.00  . Years: 32.00  . Types: Cigarettes  . Quit date: 02/03/1974  Smokeless Tobacco  . Never Used    Diagnosis:  07/15/16 DES Cx  07/10/16 NSTEMI  ADL UCSD:   Initial Exercise Prescription:     Initial Exercise Prescription - 08/19/16 1600      Date of Initial Exercise RX and Referring Provider   Date 08/19/16   Referring Provider Larae Grooms, MD     Recumbant Bike   Level --   Watts --   Minutes --   METs --     NuStep   Level 2   SPM 85   Minutes 10   METs 2.5     Arm Ergometer   Level 1   Watts 15   Minutes 10   METs 2.02     Track   Laps 11   Minutes 10   METs 2.92     Prescription Details   Frequency (times per week) 2   Duration Progress to 30 minutes of continuous aerobic without signs/symptoms of physical distress     Intensity   THRR 40-80% of Max Heartrate 54-109   Ratings of Perceived Exertion 11-13   Perceived Dyspnea 0-4     Progression   Progression Continue to progress workloads to maintain intensity without signs/symptoms of physical distress.     Resistance Training   Training Prescription Yes   Weight 2lbs   Reps 10-15      Discharge Exercise Prescription (Final Exercise Prescription Changes):     Exercise Prescription Changes - 11/07/16 1512      Response to Exercise   Blood Pressure (Admit) 102/60   Blood Pressure (Exercise) 128/72   Blood Pressure  (Exit) 118/50   Heart Rate (Admit) 50 bpm   Heart Rate (Exercise) 87 bpm   Heart Rate (Exit) 47 bpm   Rating of Perceived Exertion (Exercise) 12   Symptoms none   Duration Continue with 30 min of aerobic exercise without signs/symptoms of physical distress.   Intensity THRR unchanged     Progression   Progression Continue to progress workloads to maintain intensity without signs/symptoms of physical distress.   Average METs 2.1     Resistance Training   Training Prescription Yes   Weight 2lbs   Reps 10-15   Time 10 Minutes     NuStep   Level 5   SPM 90   Minutes 10   METs 1.9     Arm Ergometer   Level 1   Watts 15   Minutes 10   METs 2     Track   Laps 3   Minutes 5   METs 1.7     Home Exercise Plan   Plans to continue exercise at [employer] (comment)  water aerobics and exercise equipment at [facility] 3 additional days to program exercise sessions.   Initial Home  Exercises Provided 09/12/16      Functional Capacity:     6 Minute Walk    Row Name 08/19/16 1604 11/07/16 1027 11/07/16 1035     6 Minute Walk   Phase Initial Discharge  -   Distance 1339 feet 1348 feet  -   Distance % Change  - 0.67 %  -   Distance Feet Change  - 9 ft  -   Walk Time 6 minutes 6 minutes  -   # of Rest Breaks 0 0  -   MPH 2.54 2.55  -   METS 1.61 1.59  -   RPE 12 13  -   VO2 Peak 5.65  -  -   Symptoms No  -  -   Resting HR 72 bpm  - 61 bpm   Resting BP 118/64  - 122/60   Max Ex. HR 76 bpm  - 75 bpm   Max Ex. BP 138/80  - 132/72   2 Minute Post BP 122/68 104/64  -      Psychological, QOL, Others - Outcomes: PHQ 2/9: Depression screen [facility] 2/9 11/07/2016 01/08/2015  Decreased Interest 0 0  Down, Depressed, Hopeless 0 0  PHQ - 2 Score 0 0  Some recent data might be hidden    Quality of Life:     Quality of Life - 11/10/16 1527      Quality of Life Scores   Health/Function Pre 21.69 %   Health/Function Post 22.75 %   Health/Function % Change  4.89 %   Socioeconomic Pre 28.58 %   Socioeconomic Post 22 %   Socioeconomic % Change  -23.02 %   Psych/Spiritual Pre 25.57 %   Psych/Spiritual Post 22.71 %   Psych/Spiritual % Change -11.18 %   Family Pre 25.5 %   Family Post 22.88 %   Family % Change -10.27 %   GLOBAL Pre 24.48 %   GLOBAL Post 22.59 %   GLOBAL % Change -7.72 %      Personal Goals: Goals established at orientation with interventions provided to work toward goal.     Personal Goals and Risk Factors at Admission - 08/19/16 1601      Core Components/Risk Factors/Patient Goals on Admission    Weight Management Obesity;Yes   Intervention Obesity: Provide education and appropriate resources to help participant work on and attain dietary goals.;Weight Management/Obesity: Establish reasonable short term and long term weight goals.   Admit Weight 172 lb 6.4 oz (78.2 kg)   Goal Weight: Short Term 166 lb 8 oz (75.5 kg)   Goal Weight: Long Term 162 lb 8 oz (73.7 kg)   Expected Outcomes Short Term: Continue to assess and modify interventions until short term weight is achieved;Long Term: Adherence to nutrition and physical activity/exercise program aimed toward attainment of established weight goal;Weight Loss: Understanding of general recommendations for a balanced deficit meal plan, which promotes 1-2 lb weight loss per week and includes a negative energy balance of [phone] kcal/d   Hypertension Yes   Intervention Provide education on lifestyle modifcations including regular physical activity/exercise, weight management, moderate sodium restriction and increased consumption of fresh fruit, vegetables, and low fat dairy, alcohol moderation, and smoking cessation.;Monitor prescription use compliance.   Expected Outcomes Short Term: Continued assessment and intervention until BP is < 140/71m HG in hypertensive participants. < 130/863mHG in hypertensive participants with diabetes, heart failure or chronic kidney disease.;Long  Term: Maintenance of blood pressure at goal levels.  Lipids Yes   Intervention Provide education and support for participant on nutrition & aerobic/resistive exercise along with prescribed medications to achieve LDL <52m, HDL >486m   Expected Outcomes Short Term: Participant states understanding of desired cholesterol values and is compliant with medications prescribed. Participant is following exercise prescription and nutrition guidelines.;Long Term: Cholesterol controlled with medications as prescribed, with individualized exercise RX and with personalized nutrition plan. Value goals: LDL < 7056mHDL > 40 mg.       Personal Goals Discharge:     Goals and Risk Factor Review    Row Name 09/15/16 1415 10/15/16 0919           Core Components/Risk Factors/Patient Goals Review   Personal Goals Review Weight Management/Obesity;Lipids;Hypertension Weight Management/Obesity;Lipids;Hypertension      Review Pt is off to a good start with the the reduction of modifiable risk factors. Pt is making progress with the the reduction of modifiable risk factors.  Plan to transition to the maintenance program.      Expected Outcomes Pt will demonstrate and/or make progress toward achieving the desired weight , lipid reading and blood pressure within normal limits. Pt will demonstrate and/or make progress toward achieving the desired weight , lipid reading and blood pressure within normal limits.         Exercise Goals and Review:     Exercise Goals    Row Name 08/19/16 1603             Exercise Goals   Increase Physical Activity Yes       Intervention Provide advice, education, support and counseling about physical activity/exercise needs.;Develop an individualized exercise prescription for aerobic and resistive training based on initial evaluation findings, risk stratification, comorbidities and participant's personal goals.       Expected Outcomes Achievement of increased cardiorespiratory  fitness and enhanced flexibility, muscular endurance and strength shown through measurements of functional capacity and personal statement of participant.       Increase Strength and Stamina Yes       Intervention Provide advice, education, support and counseling about physical activity/exercise needs.;Develop an individualized exercise prescription for aerobic and resistive training based on initial evaluation findings, risk stratification, comorbidities and participant's personal goals.       Expected Outcomes Achievement of increased cardiorespiratory fitness and enhanced flexibility, muscular endurance and strength shown through measurements of functional capacity and personal statement of participant.          Nutrition & Weight - Outcomes:     Pre Biometrics - 08/19/16 1607      Pre Biometrics   Height 5' 0.5" (1.537 m)   Weight 172 lb 6.4 oz (78.2 kg)   Waist Circumference 41.5 inches   Hip Circumference 46.5 inches   Waist to Hip Ratio 0.89 %   BMI (Calculated) 33.2   Triceps Skinfold 18 mm   % Body Fat 43.8 %   Grip Strength 27.5 kg   Flexibility 13.5 in   Single Leg Stand 1.96 seconds         Post Biometrics - 11/07/16 1033       Post  Biometrics   Height 5' 0.5" (1.537 m)   Weight 172 lb 6.4 oz (78.2 kg)   Waist Circumference 40 inches   Hip Circumference 46.5 inches   Waist to Hip Ratio 0.86 %   BMI (Calculated) 33.1   Triceps Skinfold 18 mm   % Body Fat 43.2 %   Grip Strength 27.5 kg   Flexibility 13.5  in   Single Leg Stand 0 seconds      Nutrition:     Nutrition Therapy & Goals - 08/19/16 1551      Nutrition Therapy   Diet Therapeutic Lifestyle Changes   Drug/Food Interactions Coumadin/Vit K     Personal Nutrition Goals   Nutrition Goal Wt loss of 1-2 lb/week to a wt loss goal of 6-24 lb while in [place], educate and counsel regarding individualized specific dietary modifications aiming  towards targeted core components such as weight, hypertension, lipid management, diabetes, heart failure and other comorbidities.   Expected Outcomes Short Term Goal: Understand basic principles of dietary content, such as calories, fat, sodium, cholesterol and nutrients.;Long Term Goal: Adherence to prescribed nutrition plan.      Nutrition Discharge:     Nutrition Assessments - 11/14/16 1327      MEDFICTS Scores   Pre Score 53   Post Score 34   Score Difference -19      Education Questionnaire Score:     Knowledge Questionnaire Score - 11/13/16 1431      Knowledge Questionnaire Score   Post Score 23/24      Goals reviewed with patient. Pt graduated from cardiac rehab program today with completion of16 exercise sessions in Phase II. Pt maintained good attendance and progressed nicely during his participation in rehab as evidenced by increased MET level.   Medication list reconciled. Repeat  PHQ score- 0.  Pt completed post assessment quality of life survey.  Pt scored the following:     Quality of Life - 11/10/16 1527      Quality of Life Scores   Health/Function Pre 21.69 %   Health/Function Post 22.75 %   Health/Function % Change 4.89 %   Socioeconomic Pre 28.58 %   Socioeconomic Post 22 %   Socioeconomic % Change  -23.02 %   Psych/Spiritual Pre 25.57 %   Psych/Spiritual Post 22.71 %   Psych/Spiritual % Change -11.18 %   Family Pre 25.5 %   Family Post 22.88 %   Family % Change -10.27 %   GLOBAL Pre 24.48 %   GLOBAL Post 22.59 %   GLOBAL % Change -7.72 %      Pt has made significant lifestyle changes and should be commended for her success. Pt feels he has achieved her short term goals during cardiac rehab. Pt has increased strength and stamina.  Pt long term goal was to lose weight. Pt maintained her weight but did not lose.  Pt attributes this to inconsistent attendance and the love of food.  Pt plans to be more mindful of eating patterns and balance activity  level to match her intake.   Pt plans to continue exercise in cardiac maintenance program two times a week on mondays and fridays.  Pt will attend water aerobic class on Wednesdays.  It was a delight to have this patient in phase II and we look forward to pt participating in the maintenance program. Maurice Small RN, BSN Cardiac and Pulmonary Rehab Nurse Navigator

## 2016-11-10 ENCOUNTER — Encounter (HOSPITAL_COMMUNITY): Payer: Medicare Other

## 2016-11-10 ENCOUNTER — Encounter (HOSPITAL_COMMUNITY): Payer: Self-pay

## 2016-11-10 ENCOUNTER — Encounter (HOSPITAL_COMMUNITY)
Admission: RE | Admit: 2016-11-10 | Discharge: 2016-11-10 | Disposition: A | Discharge status: Home / Self Care | Payer: Self-pay | Source: Ambulatory Visit | Attending: Interventional Cardiology | Admitting: Interventional Cardiology

## 2016-11-10 DIAGNOSIS — Z48812 Encounter for surgical aftercare following surgery on the circulatory system: Secondary | ICD-10-CM | POA: Insufficient documentation

## 2016-11-10 DIAGNOSIS — I214 Non-ST elevation (NSTEMI) myocardial infarction: Secondary | ICD-10-CM | POA: Insufficient documentation

## 2016-11-10 DIAGNOSIS — Z955 Presence of coronary angioplasty implant and graft: Secondary | ICD-10-CM | POA: Insufficient documentation

## 2016-11-10 NOTE — Progress Notes (Signed)
Cardiology Office Note   Date:  11/11/2016   ID:  Patty [name], DOB 03/20/1930, MRN [id number]  PCP:  [name], MD    No chief complaint on file. CAD   Wt Readings from Last 3 Encounters:  11/11/16 175 lb (79.4 kg)  11/07/16 172 lb 6.4 oz (78.2 kg)  11/03/16 171 lb 1.9 oz (77.6 kg)       History of Present Illness: Patty [name] is a 83 y.o. female  with history of CAD s/p multiple stents, chronic combined CHF, permanent atrial fib/flutter on Coumadin with tachy-brady syndrome s/p leadless PPM (with premature battery failure no longer active - being followed conservatively for now), probable CKD III per labs, granulosa cell carcinoma, HTN, HLD, LBBB, varicose veins, renal artery stenosis, PVD, diverticulitis, OSA who presents for f/u.  She has significant CAD with stenting x 2 to the LAD and x 1 to the LCx in 2000, rotational arthrectomy to proximal LCx and DES to LAD in 2014 and DES to LCx in 2014, along with DES to LAD for re-in-stent stenosis in 2015, and DES to ostial LAD in 2016. PVD is listed in chart but I cannot find further notes regarding this as LE doppler 2014 showed no evidence of significant LE disease   She was admitted in June 2018 with exertional dyspnea and chest tightness and palpitations. She was found to be in rapid atrial fib/flutter and minimally elevated troponin. 2D Echo 07/11/16 showed moderate focal basal and mild concentric hypertrophy, EF 50-55%, no RWMA, septal dyssyngery c/w LBBB, mild AI, massively dilated LA, trivial TR, mild PR. Coumadin was held in prep for cath which was performed 07/14/16 showing 100% CTO of OM1 with L-L collaterals, 90% distal Cx, 100% distal PLOM, otherwise nonobstructive disease, normal LVEDP - unsuccessful PCI of the distal Cx due to inability to cross the lesion with a balloon. She was brought back 07/15/16 and underwent successful orbital atherectomy & DES to mid LCx.  She was discharged on ASA, Plavix and Coumadin with  plan to discontinue ASA after 1 month. Last labs 07/2016 showed K 3.2-3.9, Cr 1.01, Hgb 9.4-10.5 (previously 10.7-12.5 in 12/2015), LDL 60.  She is doing well, even while her pacer is not functioning well.  She is still exercising at rehab.  She goes to the [facility] as well.   She has fatigue when she outside of rehab.  Her HR has been well controlled on a lower dose of metoprolol.   She had a squamous cell CA removed from her left lower lip.  She healed well.   Past Medical History:  Diagnosis Date  . Chronic anticoagulation - coumadin, CHADS2VASC=6 05/17/2015  . CKD (chronic kidney disease), stage III ([place])   . Combined systolic and diastolic heart failure ([place])   . Coronary artery disease    a. s/p multiple stents - stenting x 2 to the LAD and x 1 to the LCx in 2000, rotational arthrectomy to proximal LCx and DES to LAD in 2014 and DES to LCx in 2014, along with DES to LAD for re-in-stent stenosis in 2015, and DES to ostial LAD in 2016. b. 07/2016 - orbital atherectomy & DES to mid LCx.  . Diverticulitis   . Granulosa cell carcinoma ([place])    abd; last episode was in 2009  . Hyperlipidemia   . Hypertension   . LBBB (left bundle branch block)       . Myocardial infarction ([place]) 2002  . Obesity   .  OSA on CPAP   . Pacemaker failure    a. Prior leadless PPM with premature battery failure, being managed conservatively without replacement.  . Peripheral vascular disease ([place])   . Permanent atrial fibrillation ([place]) 2013  . Renal artery stenosis ([place])   . Tachycardia-bradycardia syndrome ([place])    a. s/p leadless pacemaker ([name]) implanted by Dr [name]  . Varicose veins     Past Surgical History:  Procedure Laterality Date  . ABDOMINAL HYSTERECTOMY    . CARDIAC CATHETERIZATION  09/03/2007   EF 70%; Failed attempt at PCI to OM  . CARDIAC CATHETERIZATION  11/01/2003   EF 70%  . CARDIAC CATHETERIZATION N/A 12/14/2015   Procedure: Left Heart Cath and Coronary Angiography;  Surgeon:  Burnell Blanks, MD;  Location: [place] CV LAB;  Service: Cardiovascular;  Laterality: N/A;  . CARDIOVERSION  12/31/2011   Procedure: CARDIOVERSION;  Surgeon: [name], MD;  Location: [facility] ENDOSCOPY;  Service: Cardiovascular;  Laterality: N/A;  . CARDIOVERSION N/A 12/31/2011   Procedure: CARDIOVERSION;  Surgeon: [name], MD;  Location: [facility] CATH LAB;  Service: Cardiovascular;  Laterality: N/A;  . CATARACT EXTRACTION, BILATERAL  2015  . CHOLECYSTECTOMY  1980's  . COLON SURGERY  2004   colectomy for diverticulosis  . CORONARY ANGIOPLASTY WITH STENT PLACEMENT  2000; 08/11/2012; 11/12/2012   3 + 2 LAD & CFX; 2nd CFX stent 11/12/2012  . CORONARY ATHERECTOMY N/A 07/15/2016   Procedure: Coronary Atherectomy;  Surgeon: Martinique, Peter M, MD;  Location: [place] CV LAB;  Service: Cardiovascular;  Laterality: N/A;  . CORONARY BALLOON ANGIOPLASTY N/A 07/14/2016   Procedure: Coronary Balloon Angioplasty;  Surgeon: Martinique, Peter M, MD;  Location: [place] [facility] LAB;  Service: Cardiovascular;  Laterality: N/A;  . CORONARY STENT INTERVENTION N/A 07/15/2016   Procedure: Coronary Stent Intervention;  Surgeon: Martinique, Peter M, MD;  Location: [place] CV LAB;  Service: Cardiovascular;  Laterality: N/A;  . FRACTIONAL FLOW RESERVE WIRE  10/07/2013   Procedure: [place];  Surgeon: [name], MD;  Location: [facility] CATH LAB;  Service: Cardiovascular;;  . granulosa tumor excision  2000; 2003; 2004; 2007   "all in my abdomen including small intestines, outside my ?uterus/etc" (08/11/2012)  . HERNIA REPAIR  2005   "laparoscopic"  . LEFT HEART CATH AND CORONARY ANGIOGRAPHY N/A 07/14/2016   Procedure: Left Heart Cath and Coronary Angiography;  Surgeon: Martinique, Peter M, MD;  Location: [place] CV LAB;  Service: Cardiovascular;  Laterality: N/A;  . LEFT HEART CATHETERIZATION WITH CORONARY ANGIOGRAM N/A 11/12/2012   Procedure: LEFT HEART CATHETERIZATION WITH CORONARY  ANGIOGRAM;  Surgeon: [name], MD;  Location: [facility] CATH LAB;  Service: Cardiovascular;  Laterality: N/A;  . LEFT HEART CATHETERIZATION WITH CORONARY ANGIOGRAM N/A 10/07/2013   Procedure: LEFT HEART CATHETERIZATION WITH CORONARY ANGIOGRAM;  Surgeon: [name], MD;  Location: [facility] CATH LAB;  Service: Cardiovascular;  Laterality: N/A;  . LEFT HEART CATHETERIZATION WITH CORONARY ANGIOGRAM N/A 12/14/2013   Procedure: LEFT HEART CATHETERIZATION WITH CORONARY ANGIOGRAM;  Surgeon: Sinclair Grooms, MD;  Location: [facility] CATH LAB;  Service: Cardiovascular;  Laterality: N/A;  . LEFT HEART CATHETERIZATION WITH CORONARY ANGIOGRAM N/A 05/16/2014   Procedure: LEFT HEART CATHETERIZATION WITH CORONARY ANGIOGRAM;  Surgeon: Sherren Mocha, MD;  Location: [facility] CATH LAB;  Service: Cardiovascular;  Laterality: N/A;  . PERCUTANEOUS CORONARY INTERVENTION-BALLOON ONLY  08/04/2012   Procedure: PERCUTANEOUS CORONARY INTERVENTION-BALLOON ONLY;  Surgeon: [name], MD;  Location: [facility] CATH LAB;  Service: Cardiovascular;;  . PERCUTANEOUS CORONARY ROTOBLATOR INTERVENTION (PCI-R) N/A 08/11/2012   Procedure: PERCUTANEOUS CORONARY ROTOBLATOR INTERVENTION (PCI-R);  Surgeon: [name], MD;  Location: [facility] CATH LAB;  Service: Cardiovascular;  Laterality: N/A;  . PERCUTANEOUS CORONARY STENT INTERVENTION (PCI-S)  10/07/2013   Procedure: PERCUTANEOUS CORONARY STENT INTERVENTION (PCI-S);  Surgeon: [name], MD;  Location: [facility] CATH LAB;  Service: Cardiovascular;;  . PERMANENT PACEMAKER INSERTION N/A 03/16/2012   [name] (SJM) leadless pacemaker (LEADLESS II STUDY PATEINT)  . SALIVARY GLAND SURGERY  2000's   "had a little lump removed; granulosa related; it was benign" (08/11/2012)  . TEE WITHOUT CARDIOVERSION  12/31/2011   Procedure: TRANSESOPHAGEAL ECHOCARDIOGRAM (TEE);  Surgeon: [name], MD;  Location: [place];  Service: Cardiovascular;  Laterality: N/A;  . VARICOSE VEIN SURGERY Bilateral  1977     Current Outpatient Prescriptions  Medication Sig Dispense Refill  . atorvastatin (LIPITOR) 80 MG tablet TAKE 1 TABLET BY MOUTH EVERY MORNING 30 tablet 9  . beta carotene w/minerals (OCUVITE) tablet Take 1 tablet by mouth 2 (two) times daily.     . Calcium Carb-Cholecalciferol (CALTRATE 600+D) 600-800 MG-UNIT TABS Take 1 tablet by mouth every evening.     . clopidogrel (PLAVIX) 75 MG tablet TAKE 1 TABLET BY MOUTH EVERY DAY WITH BREAKFAST 30 tablet 5  . Coenzyme Q-10 100 MG capsule Take 200 mg by mouth daily.     Marland Kitchen diltiazem (CARDIZEM CD) 120 MG 24 hr capsule TAKE 1 CAPSULE (120 MG TOTAL) BY MOUTH AT BEDTIME. 90 capsule 3  . diltiazem (CARDIZEM CD) 240 MG 24 hr capsule Take 1 capsule (240 mg total) by mouth daily. With breakfast 90 capsule 3  . furosemide (LASIX) 40 MG tablet Take 80 mg by mouth daily.    . isosorbide mononitrate (IMDUR) 60 MG 24 hr tablet TAKE 1 TABLET (60 MG TOTAL) BY MOUTH DAILY. 30 tablet 9  . KLOR-CON M20 20 MEQ tablet TAKE 1 TABLET (20 MEQ TOTAL) BY MOUTH DAILY. 30 tablet 9  . metoprolol (LOPRESSOR) 100 MG tablet TAKE 1 TABLET (100 MG TOTAL) BY MOUTH 2 (TWO) TIMES DAILY. 60 tablet 11  . Multiple Vitamin (MULTIVITAMIN) capsule Take 1 capsule by mouth daily.    . nitroGLYCERIN (NITROSTAT) 0.4 MG SL tablet Place 0.4 mg under the tongue every 5 (five) minutes as needed for chest pain. 3 dose max    . Omega-3 Fatty Acids (FISH OIL PO) Take 1,200 mg by mouth daily.     Marland Kitchen spironolactone (ALDACTONE) 25 MG tablet Take 25 mg by mouth daily as needed (fluid).  45 tablet 11  . warfarin (COUMADIN) 2 MG tablet Take 1 tablet (2 mg total) by mouth as directed. 30 tablet 3   No current facility-administered medications for this visit.     Allergies:   Bee venom and Other    Social History:  The patient  reports that she quit smoking about 42 years ago. Her smoking use included Cigarettes. She has a 32.00 pack-year smoking history. She has never used smokeless tobacco. She  reports that she drinks about 3.0 oz of alcohol per week . She reports that she does not use drugs.   Family History:  The patient's family history includes Diabetes in her brother and father; Heart attack in her brother and father; Hypertension in her brother and father; Kidney failure in her brother; Stroke in her mother.    ROS:  Please see the history of present illness.   Otherwise, review of  systems are positive for fatigue at the end of the day.   All other systems are reviewed and negative.    PHYSICAL EXAM: VS:  BP 124/60   Pulse 69   Ht 5' (1.524 m)   Wt 175 lb (79.4 kg)   SpO2 96%   BMI 34.18 kg/m  , BMI Body mass index is 34.18 kg/m. GEN: Well nourished, well developed, in no acute distress  HEENT: normal  Neck: no JVD, carotid bruits, or masses Cardiac: RRR; 3/6 systolic murmur,;  no rubs, or gallops,no edema  Respiratory:  clear to auscultation bilaterally, normal work of breathing GI: soft, nontender, nondistended, + BS MS: no deformity or atrophy  Skin: warm and dry, no rash;bruising noted Neuro:  Strength and sensation are intact Psych: euthymic mood, full affect    Recent Labs: 07/10/2016: B Natriuretic Peptide 361.3 07/11/2016: TSH 2.360 07/12/2016: ALT 29; Magnesium 1.9 07/16/2016: BUN 15; Creatinine, Ser 1.01; Hemoglobin 9.4; Platelets 350; Potassium 3.8; Sodium 137   Lipid Panel    Component Value Date/Time   CHOL 107 07/11/2016 0926   CHOL 160 02/13/2016 0855   TRIG 46 07/11/2016 0926   HDL 38 (L) 07/11/2016 0926   HDL 68 02/13/2016 0855   CHOLHDL 2.8 07/11/2016 0926   VLDL 9 07/11/2016 0926   LDLCALC 60 07/11/2016 0926   LDLCALC 75 02/13/2016 0855     Other studies Reviewed: Additional studies/ records that were reviewed today with results demonstrating: 6/18 echo showed EF 50-55%, septal dyssynergy due to LBBB.   ASSESSMENT AND PLAN:  1. CAD: No angina. Continue aggressive secondary prevention.  Most recent PCI in 6/18.  Done well since that  time.  2. AFib: Appears to be in a regular rhythm today.  Coumadin for stroke prevention.  Continue medical therapy. Metoprolol recently decreased.  3. HTN: BP well controlled.  4. Anticoagulated: No bleeding issues outside of her surgery.   Current medicines are reviewed at length with the patient today.  The patient concerns regarding her medicines were addressed.  The following changes have been made:  No change  Labs/ tests ordered today include:  No orders of the defined types were placed in this encounter.   Recommend 150 minutes/week of aerobic exercise Low fat, low carb, high fiber diet recommended  Disposition:   FU in 6 months   Signed, Larae Grooms, MD  11/11/2016 11:30 AM    [facility] [place], [place], [place]  [address] Phone: [phone]; Fax: [phone]

## 2016-11-11 ENCOUNTER — Encounter: Payer: Self-pay | Admitting: Interventional Cardiology

## 2016-11-11 ENCOUNTER — Ambulatory Visit (INDEPENDENT_AMBULATORY_CARE_PROVIDER_SITE_OTHER): Payer: Medicare Other | Admitting: Interventional Cardiology

## 2016-11-11 VITALS — BP 124/60 | HR 69 | Ht 60.0 in | Wt 175.0 lb

## 2016-11-11 DIAGNOSIS — E782 Mixed hyperlipidemia: Secondary | ICD-10-CM | POA: Diagnosis not present

## 2016-11-11 DIAGNOSIS — I10 Essential (primary) hypertension: Secondary | ICD-10-CM

## 2016-11-11 DIAGNOSIS — I481 Persistent atrial fibrillation: Secondary | ICD-10-CM | POA: Diagnosis not present

## 2016-11-11 DIAGNOSIS — I4819 Other persistent atrial fibrillation: Secondary | ICD-10-CM

## 2016-11-11 DIAGNOSIS — I200 Unstable angina: Secondary | ICD-10-CM | POA: Diagnosis not present

## 2016-11-11 DIAGNOSIS — Z7901 Long term (current) use of anticoagulants: Secondary | ICD-10-CM

## 2016-11-11 DIAGNOSIS — I209 Angina pectoris, unspecified: Secondary | ICD-10-CM

## 2016-11-11 DIAGNOSIS — I25118 Atherosclerotic heart disease of native coronary artery with other forms of angina pectoris: Secondary | ICD-10-CM

## 2016-11-11 NOTE — Patient Instructions (Signed)

## 2016-11-14 ENCOUNTER — Encounter (HOSPITAL_COMMUNITY)
Admission: RE | Admit: 2016-11-14 | Discharge: 2016-11-14 | Disposition: A | Discharge status: Home / Self Care | Payer: Self-pay | Source: Ambulatory Visit | Attending: Interventional Cardiology | Admitting: Interventional Cardiology

## 2016-11-14 ENCOUNTER — Encounter (HOSPITAL_COMMUNITY): Payer: Medicare Other

## 2016-11-14 ENCOUNTER — Ambulatory Visit (INDEPENDENT_AMBULATORY_CARE_PROVIDER_SITE_OTHER): Payer: Medicare Other | Admitting: *Deleted

## 2016-11-14 DIAGNOSIS — Z5181 Encounter for therapeutic drug level monitoring: Secondary | ICD-10-CM

## 2016-11-14 DIAGNOSIS — I4891 Unspecified atrial fibrillation: Secondary | ICD-10-CM

## 2016-11-14 LAB — POCT INR: INR: 2.5

## 2016-11-17 ENCOUNTER — Encounter (HOSPITAL_COMMUNITY)
Admission: RE | Admit: 2016-11-17 | Discharge: 2016-11-17 | Disposition: A | Discharge status: Home / Self Care | Payer: Self-pay | Source: Ambulatory Visit | Attending: Interventional Cardiology | Admitting: Interventional Cardiology

## 2016-11-17 ENCOUNTER — Encounter (HOSPITAL_COMMUNITY): Payer: Medicare Other

## 2016-11-18 DIAGNOSIS — F4323 Adjustment disorder with mixed anxiety and depressed mood: Secondary | ICD-10-CM | POA: Diagnosis not present

## 2016-11-21 ENCOUNTER — Encounter (HOSPITAL_COMMUNITY)
Admission: RE | Admit: 2016-11-21 | Discharge: 2016-11-21 | Disposition: A | Discharge status: Home / Self Care | Payer: Self-pay | Source: Ambulatory Visit | Attending: Interventional Cardiology | Admitting: Interventional Cardiology

## 2016-11-21 ENCOUNTER — Encounter (HOSPITAL_COMMUNITY): Payer: Medicare Other

## 2016-11-24 ENCOUNTER — Encounter (HOSPITAL_COMMUNITY): Payer: Medicare Other

## 2016-11-24 ENCOUNTER — Encounter (HOSPITAL_COMMUNITY)
Admission: RE | Admit: 2016-11-24 | Discharge: 2016-11-24 | Disposition: A | Discharge status: Home / Self Care | Payer: Self-pay | Source: Ambulatory Visit | Attending: Interventional Cardiology | Admitting: Interventional Cardiology

## 2016-11-26 DIAGNOSIS — Z23 Encounter for immunization: Secondary | ICD-10-CM | POA: Diagnosis not present

## 2016-11-27 DIAGNOSIS — F4323 Adjustment disorder with mixed anxiety and depressed mood: Secondary | ICD-10-CM | POA: Diagnosis not present

## 2016-11-28 ENCOUNTER — Encounter (HOSPITAL_COMMUNITY): Payer: Medicare Other

## 2016-11-28 ENCOUNTER — Encounter (HOSPITAL_COMMUNITY)
Admission: RE | Admit: 2016-11-28 | Discharge: 2016-11-28 | Disposition: A | Discharge status: Home / Self Care | Payer: Self-pay | Source: Ambulatory Visit | Attending: Interventional Cardiology | Admitting: Interventional Cardiology

## 2016-12-01 ENCOUNTER — Ambulatory Visit: Payer: Medicare Other | Attending: Gynecologic Oncology | Admitting: Gynecologic Oncology

## 2016-12-01 ENCOUNTER — Encounter: Payer: Self-pay | Admitting: Gynecologic Oncology

## 2016-12-01 ENCOUNTER — Ambulatory Visit (HOSPITAL_BASED_OUTPATIENT_CLINIC_OR_DEPARTMENT_OTHER): Payer: Medicare Other

## 2016-12-01 ENCOUNTER — Encounter (HOSPITAL_COMMUNITY): Payer: Self-pay

## 2016-12-01 VITALS — BP 139/54 | HR 65 | Temp 97.7°F | Resp 18 | Wt 177.9 lb

## 2016-12-01 DIAGNOSIS — R1031 Right lower quadrant pain: Secondary | ICD-10-CM | POA: Diagnosis not present

## 2016-12-01 DIAGNOSIS — G4733 Obstructive sleep apnea (adult) (pediatric): Secondary | ICD-10-CM | POA: Insufficient documentation

## 2016-12-01 DIAGNOSIS — I739 Peripheral vascular disease, unspecified: Secondary | ICD-10-CM | POA: Diagnosis not present

## 2016-12-01 DIAGNOSIS — E785 Hyperlipidemia, unspecified: Secondary | ICD-10-CM | POA: Diagnosis not present

## 2016-12-01 DIAGNOSIS — Z90710 Acquired absence of both cervix and uterus: Secondary | ICD-10-CM | POA: Insufficient documentation

## 2016-12-01 DIAGNOSIS — I4891 Unspecified atrial fibrillation: Secondary | ICD-10-CM | POA: Diagnosis not present

## 2016-12-01 DIAGNOSIS — I10 Essential (primary) hypertension: Secondary | ICD-10-CM | POA: Diagnosis not present

## 2016-12-01 DIAGNOSIS — R978 Other abnormal tumor markers: Secondary | ICD-10-CM

## 2016-12-01 DIAGNOSIS — Z955 Presence of coronary angioplasty implant and graft: Secondary | ICD-10-CM | POA: Insufficient documentation

## 2016-12-01 DIAGNOSIS — Z9049 Acquired absence of other specified parts of digestive tract: Secondary | ICD-10-CM | POA: Insufficient documentation

## 2016-12-01 DIAGNOSIS — I130 Hypertensive heart and chronic kidney disease with heart failure and stage 1 through stage 4 chronic kidney disease, or unspecified chronic kidney disease: Secondary | ICD-10-CM | POA: Insufficient documentation

## 2016-12-01 DIAGNOSIS — Z79899 Other long term (current) drug therapy: Secondary | ICD-10-CM | POA: Insufficient documentation

## 2016-12-01 DIAGNOSIS — Z87891 Personal history of nicotine dependence: Secondary | ICD-10-CM | POA: Insufficient documentation

## 2016-12-01 DIAGNOSIS — C569 Malignant neoplasm of unspecified ovary: Secondary | ICD-10-CM

## 2016-12-01 DIAGNOSIS — I2510 Atherosclerotic heart disease of native coronary artery without angina pectoris: Secondary | ICD-10-CM | POA: Insufficient documentation

## 2016-12-01 DIAGNOSIS — I481 Persistent atrial fibrillation: Secondary | ICD-10-CM | POA: Diagnosis not present

## 2016-12-01 DIAGNOSIS — I447 Left bundle-branch block, unspecified: Secondary | ICD-10-CM | POA: Insufficient documentation

## 2016-12-01 DIAGNOSIS — Z7901 Long term (current) use of anticoagulants: Secondary | ICD-10-CM | POA: Diagnosis not present

## 2016-12-01 DIAGNOSIS — I5023 Acute on chronic systolic (congestive) heart failure: Secondary | ICD-10-CM | POA: Diagnosis not present

## 2016-12-01 DIAGNOSIS — E669 Obesity, unspecified: Secondary | ICD-10-CM | POA: Diagnosis not present

## 2016-12-01 DIAGNOSIS — I5042 Chronic combined systolic (congestive) and diastolic (congestive) heart failure: Secondary | ICD-10-CM | POA: Insufficient documentation

## 2016-12-01 DIAGNOSIS — I252 Old myocardial infarction: Secondary | ICD-10-CM | POA: Insufficient documentation

## 2016-12-01 DIAGNOSIS — Z950 Presence of cardiac pacemaker: Secondary | ICD-10-CM | POA: Diagnosis not present

## 2016-12-01 DIAGNOSIS — I214 Non-ST elevation (NSTEMI) myocardial infarction: Secondary | ICD-10-CM | POA: Diagnosis not present

## 2016-12-01 DIAGNOSIS — I482 Chronic atrial fibrillation: Secondary | ICD-10-CM | POA: Insufficient documentation

## 2016-12-01 DIAGNOSIS — Z90722 Acquired absence of ovaries, bilateral: Secondary | ICD-10-CM | POA: Diagnosis not present

## 2016-12-01 DIAGNOSIS — Z9079 Acquired absence of other genital organ(s): Secondary | ICD-10-CM | POA: Insufficient documentation

## 2016-12-01 DIAGNOSIS — E782 Mixed hyperlipidemia: Secondary | ICD-10-CM | POA: Diagnosis not present

## 2016-12-01 DIAGNOSIS — N183 Chronic kidney disease, stage 3 (moderate): Secondary | ICD-10-CM | POA: Insufficient documentation

## 2016-12-01 LAB — BUN AND CREATININE (CC13)
BUN: 19.6 mg/dL (ref 7.0–26.0)
CREATININE: 0.9 mg/dL (ref 0.6–1.1)
EGFR: 56 mL/min/{1.73_m2} — ABNORMAL LOW (ref >60)

## 2016-12-01 NOTE — Patient Instructions (Signed)
Dr Serita Grit office will notify you of the results of your CT scan and labs.  If testing is normal, you will follow-up with her in 6 months (April, 2019).  Please notify Dr Denman George at phone number [phone] if you notice vaginal bleeding, new pelvic or abdominal pains, bloating, feeling full easy, or a change in bladder or bowel function.

## 2016-12-02 DIAGNOSIS — T148XXA Other injury of unspecified body region, initial encounter: Secondary | ICD-10-CM | POA: Diagnosis not present

## 2016-12-02 DIAGNOSIS — Z7901 Long term (current) use of anticoagulants: Secondary | ICD-10-CM | POA: Diagnosis not present

## 2016-12-02 DIAGNOSIS — Z791 Long term (current) use of non-steroidal anti-inflammatories (NSAID): Secondary | ICD-10-CM | POA: Diagnosis not present

## 2016-12-02 LAB — INHIBIN B: INHIBIN B: 119.2 pg/mL — AB (ref 0.0–16.9)

## 2016-12-02 LAB — CA 125: CANCER ANTIGEN (CA) 125: 34.9 U/mL (ref 0.0–38.1)

## 2016-12-03 LAB — PROTIME-INR: INR: 2.4 — AB (ref 0.9–1.1)

## 2016-12-04 ENCOUNTER — Encounter: Payer: Self-pay | Admitting: Gynecologic Oncology

## 2016-12-04 ENCOUNTER — Ambulatory Visit (INDEPENDENT_AMBULATORY_CARE_PROVIDER_SITE_OTHER): Payer: Medicare Other

## 2016-12-04 DIAGNOSIS — I4891 Unspecified atrial fibrillation: Secondary | ICD-10-CM | POA: Diagnosis not present

## 2016-12-04 DIAGNOSIS — Z5181 Encounter for therapeutic drug level monitoring: Secondary | ICD-10-CM | POA: Diagnosis not present

## 2016-12-04 NOTE — Progress Notes (Signed)
Consult Note: Gyn-Onc  Consult was requested by Dr. Alvy Bimler for the evaluation of Patty [name] 83 y.o. female  CC:  Chief Complaint  Patient presents with  . Granulosa cell carcinoma of ovary, unspecified laterality (H    Assessment/Plan:  Ms. Lillymae Duet Papandrea  is a 83 y.o.  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  She has symptoms and signs (elevated tumor markers) concerning for recurrent disease.  We will assess a CT abd/pelvis to better document disease status.  If she requires surgery, she will require medical clearance as she has significant cardiac issues.  She has stated that she refuses receiving chemotherapy as part of treatment for her disease.   HPI: Jazalyn Mondor is an 84 year old woman who is seen in consultation at the request of Dr Alvy Bimler for granulosa cell ovarian cancer.  The patient's history began in approximately 2000 when she underwent ex lap, TAH, BSO with Dr Raylene Miyamoto at [facility]. She did not receive adjuvant chemotherapy. Postoperatively she had recurrences in the peritoneum in 2004, 2007 and 2011.  The 2004 surgery was with Dr Excell Seltzer and included resection of the recurrent tumor with small bowel resection and colectomy for diverticulosis. She has had a laparoscopic hernia repair with mesh in 2005. In 2007 she underwent resection of pelvic tumor recurrence with Dr Excell Seltzer and ventral hernia repair with mesh.  In 2011 Dr Excell Seltzer performed a laparoscopic resection of suprapubic tumor. Her last CT was in 2010.   She has significant CAD with 6 stents and a history of CHF. She has a pacemaker which she states has a nonfunctioning battery and she is not currently paced. This was placed for tachycadia-bradycardia syndrome. She is obese with OSA. She has atrial fibrillation and is on coumadin.   She has been noting intermittent right lower quadrant pains for 2 weeks. They are not associated with bowel movements or  other activity. There are no alleviating factors.   Current Meds:  Outpatient Encounter Prescriptions as of 12/01/2016  Medication Sig  . atorvastatin (LIPITOR) 80 MG tablet TAKE 1 TABLET BY MOUTH EVERY MORNING  . beta carotene w/minerals (OCUVITE) tablet Take 1 tablet by mouth 2 (two) times daily.   . Calcium Carb-Cholecalciferol (CALTRATE 600+D) 600-800 MG-UNIT TABS Take 1 tablet by mouth every evening.   . clopidogrel (PLAVIX) 75 MG tablet TAKE 1 TABLET BY MOUTH EVERY DAY WITH BREAKFAST  . Coenzyme Q-10 100 MG capsule Take 200 mg by mouth daily.   Marland Kitchen diltiazem (CARDIZEM CD) 120 MG 24 hr capsule TAKE 1 CAPSULE (120 MG TOTAL) BY MOUTH AT BEDTIME.  Marland Kitchen diltiazem (CARDIZEM CD) 240 MG 24 hr capsule Take 1 capsule (240 mg total) by mouth daily. With breakfast  . furosemide (LASIX) 40 MG tablet Take 80 mg by mouth daily.  . isosorbide mononitrate (IMDUR) 60 MG 24 hr tablet TAKE 1 TABLET (60 MG TOTAL) BY MOUTH DAILY.  Marland Kitchen KLOR-CON M20 20 MEQ tablet TAKE 1 TABLET (20 MEQ TOTAL) BY MOUTH DAILY.  . metoprolol (LOPRESSOR) 100 MG tablet TAKE 1 TABLET (100 MG TOTAL) BY MOUTH 2 (TWO) TIMES DAILY.  . Multiple Vitamin (MULTIVITAMIN) capsule Take 1 capsule by mouth daily.  . nitroGLYCERIN (NITROSTAT) 0.4 MG SL tablet Place 0.4 mg under the tongue every 5 (five) minutes as needed for chest pain. 3 dose max  . Omega-3 Fatty Acids (FISH OIL PO) Take 1,200 mg by mouth daily.   Marland Kitchen spironolactone (ALDACTONE) 25 MG  tablet Take 25 mg by mouth daily as needed (fluid).   . warfarin (COUMADIN) 2 MG tablet Take 1 tablet (2 mg total) by mouth as directed.   No facility-administered encounter medications on file as of 12/01/2016.     Allergy:  Allergies  Allergen Reactions  . Bee Venom Anaphylaxis  . Other Nausea And Vomiting and Other (See Comments)    Pain medications cause severe vomiting. Tolerated slow IV morphine drip    Social Hx:   Social History   Social History  . Marital status: Widowed    Spouse name:  N/A  . Number of children: 4  . Years of education: N/A   Occupational History  . Retired Counsellor    Social History Main Topics  . Smoking status: Former Smoker    Packs/day: 1.00    Years: 32.00    Types: Cigarettes    Quit date: 02/03/1974  . Smokeless tobacco: Never Used  . Alcohol use 3.0 oz/week    5 Glasses of wine per week     Comment: 2017 WINE WITH DINNER   . Drug use: No  . Sexual activity: No   Other Topics Concern  . Not on file   Social History Narrative   Lives with family.    Past Surgical Hx:  Past Surgical History:  Procedure Laterality Date  . ABDOMINAL HYSTERECTOMY    . CARDIAC CATHETERIZATION  09/03/2007   EF 70%; Failed attempt at PCI to OM  . CARDIAC CATHETERIZATION  11/01/2003   EF 70%  . CARDIAC CATHETERIZATION N/A 12/14/2015   Procedure: Left Heart Cath and Coronary Angiography;  Surgeon: Burnell Blanks, MD;  Location: [place] [facility];  Service: Cardiovascular;  Laterality: N/A;  . CARDIOVERSION  12/31/2011   Procedure: CARDIOVERSION;  Surgeon: [name], MD;  Location: [facility] ENDOSCOPY;  Service: Cardiovascular;  Laterality: N/A;  . CARDIOVERSION N/A 12/31/2011   Procedure: CARDIOVERSION;  Surgeon: [name], MD;  Location: [facility] CATH LAB;  Service: Cardiovascular;  Laterality: N/A;  . CATARACT EXTRACTION, BILATERAL  2015  . CHOLECYSTECTOMY  1980's  . COLON SURGERY  2004   colectomy for diverticulosis  . CORONARY ANGIOPLASTY WITH STENT PLACEMENT  2000; 08/11/2012; 11/12/2012   3 + 2 LAD & CFX; 2nd CFX stent 11/12/2012  . CORONARY ATHERECTOMY N/A 07/15/2016   Procedure: Coronary Atherectomy;  Surgeon: Martinique, Peter M, MD;  Location: [place] [facility];  Service: Cardiovascular;  Laterality: N/A;  . CORONARY BALLOON ANGIOPLASTY N/A 07/14/2016   Procedure: Coronary Balloon Angioplasty;  Surgeon: Martinique, Peter M, MD;  Location: [place] [facility];  Service: Cardiovascular;  Laterality: N/A;  . CORONARY STENT  INTERVENTION N/A 07/15/2016   Procedure: Coronary Stent Intervention;  Surgeon: Martinique, Peter M, MD;  Location: [place] [facility];  Service: Cardiovascular;  Laterality: N/A;  . FRACTIONAL FLOW RESERVE WIRE  10/07/2013   Procedure: [place];  Surgeon: [name], MD;  Location: [facility] CATH LAB;  Service: Cardiovascular;;  . granulosa tumor excision  2000; 2003; 2004; 2007   "all in my abdomen including small intestines, outside my ?uterus/etc" (08/11/2012)  . HERNIA REPAIR  2005   "laparoscopic"  . LEFT HEART CATH AND CORONARY ANGIOGRAPHY N/A 07/14/2016   Procedure: Left Heart Cath and Coronary Angiography;  Surgeon: Martinique, Peter M, MD;  Location: [place] [facility];  Service: Cardiovascular;  Laterality: N/A;  . LEFT HEART CATHETERIZATION WITH CORONARY ANGIOGRAM N/A 11/12/2012   Procedure: LEFT  HEART CATHETERIZATION WITH CORONARY ANGIOGRAM;  Surgeon: [name], MD;  Location[facility] CATH LAB;  Service: Cardiovascular;  Laterality: N/A;  . LEFT HEART CATHETERIZATION WITH CORONARY ANGIOGRAM N/A 10/07/2013   Procedure: LEFT HEART CATHETERIZATION WITH CORONARY ANGIOGRAM;  Surgeon: [name], MD;  Location: [facility] CATH LAB;  Service: Cardiovascular;  Laterality: N/A;  . LEFT HEART CATHETERIZATION WITH CORONARY ANGIOGRAM N/A 12/14/2013   Procedure: LEFT HEART CATHETERIZATION WITH CORONARY ANGIOGRAM;  Surgeon: Sinclair Grooms, MD;  Location: [facility] CATH LAB;  Service: Cardiovascular;  Laterality: N/A;  . LEFT HEART CATHETERIZATION WITH CORONARY ANGIOGRAM N/A 05/16/2014   Procedure: LEFT HEART CATHETERIZATION WITH CORONARY ANGIOGRAM;  Surgeon: Sherren Mocha, MD;  Location: [facility] CATH LAB;  Service: Cardiovascular;  Laterality: N/A;  . PERCUTANEOUS CORONARY INTERVENTION-BALLOON ONLY  08/04/2012   Procedure: PERCUTANEOUS CORONARY INTERVENTION-BALLOON ONLY;  Surgeon: [name], MD;  Location: [facility] CATH LAB;  Service: Cardiovascular;;  . PERCUTANEOUS CORONARY ROTOBLATOR  INTERVENTION (PCI-R) N/A 08/11/2012   Procedure: PERCUTANEOUS CORONARY ROTOBLATOR INTERVENTION (PCI-R);  Surgeon: [name], MD;  Location: [facility] CATH LAB;  Service: Cardiovascular;  Laterality: N/A;  . PERCUTANEOUS CORONARY STENT INTERVENTION (PCI-S)  10/07/2013   Procedure: PERCUTANEOUS CORONARY STENT INTERVENTION (PCI-S);  Surgeon: [name], MD;  Location: [facility] CATH LAB;  Service: Cardiovascular;;  . PERMANENT PACEMAKER INSERTION N/A 03/16/2012   [name] (SJM) leadless pacemaker (LEADLESS II STUDY PATEINT)  . SALIVARY GLAND SURGERY  2000's   "had a little lump removed; granulosa related; it was benign" (08/11/2012)  . TEE WITHOUT CARDIOVERSION  12/31/2011   Procedure: TRANSESOPHAGEAL ECHOCARDIOGRAM (TEE);  Surgeon: [name], MD;  Location: [place];  Service: Cardiovascular;  Laterality: N/A;  . VARICOSE VEIN SURGERY Bilateral 1977    Past Medical Hx:  Past Medical History:  Diagnosis Date  . Chronic anticoagulation - coumadin, CHADS2VASC=6 05/17/2015  . CKD (chronic kidney disease), stage III ([place])   . Combined systolic and diastolic heart failure ([place])   . Coronary artery disease    a. s/p multiple stents - stenting x 2 to the LAD and x 1 to the LCx in 2000, rotational arthrectomy to proximal LCx and DES to LAD in 2014 and DES to LCx in 2014, along with DES to LAD for re-in-stent stenosis in 2015, and DES to ostial LAD in 2016. b. 07/2016 - orbital atherectomy & DES to mid LCx.  . Diverticulitis   . Granulosa cell carcinoma ([place])    abd; last episode was in 2009  . Hyperlipidemia   . Hypertension   . LBBB (left bundle branch block)       . Myocardial infarction ([place]) 2002  . Obesity   . OSA on CPAP   . Pacemaker failure    a. Prior leadless PPM with premature battery failure, being managed conservatively without replacement.  . Peripheral vascular disease ([place])   . Permanent atrial fibrillation ([place]) 2013  . Renal artery stenosis ([place])   .  Tachycardia-bradycardia syndrome ([place])    a. s/p leadless pacemaker ([name]) implanted by Dr [name]  . Varicose veins     Past Gynecological History:  Granulosa cell tumor of the ovary No LMP recorded. Patient has had a hysterectomy.  Family Hx:  Family History  Problem Relation Age of Onset  . Stroke Mother   . Heart attack Father   . Diabetes Father   . Hypertension Father   . Heart attack Brother   . Diabetes Brother   . Hypertension Brother   .  Kidney failure Brother     Review of Systems:  Constitutional  Feels well,    ENT Normal appearing ears and nares bilaterally Skin/Breast  No rash, sores, jaundice, itching, dryness Cardiovascular  No chest pain, shortness of breath, or edema  Pulmonary  No cough or wheeze.  Gastro Intestinal  No nausea, vomitting, or diarrhoea. No bright red blood per rectum, no abdominal pain, change in bowel movement, or constipation.  Genito Urinary  No frequency, urgency, dysuria, + pelvic pain Musculo Skeletal  No myalgia, arthralgia, joint swelling or pain  Neurologic  No weakness, numbness, change in gait,  Psychology  No depression, anxiety, insomnia.   Vitals:  Blood pressure (!) 139/54, pulse 65, temperature 97.7 F (36.5 C), temperature source Oral, resp. rate 18, weight 177 lb 14.4 oz (80.7 kg), SpO2 97 %.  Physical Exam: WD in NAD Neck  Supple NROM, without any enlargements.  Lymph Node Survey No cervical supraclavicular or inguinal adenopathy Cardiovascular  Pulse normal rate, regularity and rhythm. S1 and S2 normal.  Lungs  Clear to auscultation bilateraly, without wheezes/crackles/rhonchi. Good air movement.  Skin  No rash/lesions/breakdown  Psychiatry  Alert and oriented to person, place, and time  Abdomen  Normoactive bowel sounds, abdomen soft, non-tender and obese without evidence of hernia.  Back No CVA tenderness Genito Urinary  Vulva/vagina: Normal external female genitalia.   No lesions. No  discharge or bleeding.  Bladder/urethra:  No lesions or masses, well supported bladder  Vagina: normal, surgically absent uterus and cervix.   Adnexa: no palpable masses. Rectal  Good tone, no masses no cul de sac nodularity.  Extremities  No bilateral cyanosis, clubbing or edema.  Labs:  CA 125 12/01/16: 35 Inhibin B 11/21/16: 113  Donaciano Eva, MD  12/04/2016, 5:24 PM

## 2016-12-05 ENCOUNTER — Telehealth: Payer: Self-pay | Admitting: Internal Medicine

## 2016-12-05 ENCOUNTER — Other Ambulatory Visit: Payer: Self-pay | Admitting: Internal Medicine

## 2016-12-05 ENCOUNTER — Encounter (HOSPITAL_COMMUNITY)
Admission: RE | Admit: 2016-12-05 | Discharge: 2016-12-05 | Disposition: A | Discharge status: Home / Self Care | Payer: Self-pay | Source: Ambulatory Visit | Attending: Interventional Cardiology | Admitting: Interventional Cardiology

## 2016-12-05 DIAGNOSIS — Z955 Presence of coronary angioplasty implant and graft: Secondary | ICD-10-CM | POA: Insufficient documentation

## 2016-12-05 DIAGNOSIS — I214 Non-ST elevation (NSTEMI) myocardial infarction: Secondary | ICD-10-CM | POA: Insufficient documentation

## 2016-12-05 DIAGNOSIS — Z48812 Encounter for surgical aftercare following surgery on the circulatory system: Secondary | ICD-10-CM | POA: Insufficient documentation

## 2016-12-05 NOTE — Telephone Encounter (Signed)
New message    Patient calling to report  HR has between 46-49 this week. Patient wants to know if medication needs to be adjusted. States her BP is "good".  Pt c/o medication issue:  1. Name of Medication: metoprolol tartrate (LOPRESSOR) 100 MG tablet  2. How are you currently taking this medication (dosage and times per day)? As prescribed  3. Are you having a reaction (difficulty breathing--STAT)? No  4. What is your medication issue? Tired, thinks heart rate too low.   Patient c/o Palpitations:  High priority if patient c/o lightheadedness, shortness of breath, or chest pain  1) How long have you had palpitations/irregular HR/ Afib? Are you having the symptoms now? No  2) Are you currently experiencing lightheadedness, SOB or CP? SOB for about 1 week, no cp  3) Do you have a history of afib (atrial fibrillation) or irregular heart rhythm? yes  4) Have you checked your BP or HR? (document readings if available): 118/65, HR 48  5) Are you experiencing any other symptoms? No   Pt c/o swelling: STAT is pt has developed SOB within 24 hours  1) How much weight have you gained and in what time span? Patient   2) If swelling, where is the swelling located?  Left ankle  3) Are you currently taking a fluid pill? yes  4) Are you currently SOB? No  5) Do you have a log of your daily weights (if so, list)? 177lbs today, 172lbs but unsure what date she weighed  6) Have you gained 3 pounds in a day or 5 pounds in a week? yes  7) Have you traveled recently? No   .

## 2016-12-05 NOTE — Telephone Encounter (Signed)
Returned call to patient and she is feeling fine but was just concerned as the weekend was coming up and her HR today ws 49 at rehab.  She is going to decrease her Metoprolol to 100 mg in the am and 75 mg in the pm to see if this helps with her HR and her mild fatigue.  She says she feels good but sometimes at night when walking from bathroom to her bed she finds herself SOB.  She will call back on Monday and let us know if this helped.

## 2016-12-08 ENCOUNTER — Encounter (HOSPITAL_COMMUNITY)
Admission: RE | Admit: 2016-12-08 | Discharge: 2016-12-08 | Disposition: A | Discharge status: Home / Self Care | Payer: Self-pay | Source: Ambulatory Visit | Attending: Interventional Cardiology | Admitting: Interventional Cardiology

## 2016-12-09 ENCOUNTER — Ambulatory Visit (HOSPITAL_COMMUNITY)
Admission: RE | Admit: 2016-12-09 | Discharge: 2016-12-09 | Disposition: A | Discharge status: Home / Self Care | Payer: Medicare Other | Source: Ambulatory Visit | Attending: Gynecologic Oncology | Admitting: Gynecologic Oncology

## 2016-12-09 ENCOUNTER — Encounter (HOSPITAL_COMMUNITY): Payer: Self-pay

## 2016-12-09 DIAGNOSIS — R1907 Generalized intra-abdominal and pelvic swelling, mass and lump: Secondary | ICD-10-CM | POA: Insufficient documentation

## 2016-12-09 DIAGNOSIS — C569 Malignant neoplasm of unspecified ovary: Secondary | ICD-10-CM

## 2016-12-09 DIAGNOSIS — R1031 Right lower quadrant pain: Secondary | ICD-10-CM | POA: Diagnosis not present

## 2016-12-09 MED ORDER — IOPAMIDOL (ISOVUE-300) INJECTION 61%
INTRAVENOUS | Status: AC
  Filled 2016-12-09: qty 100

## 2016-12-09 MED ORDER — IOPAMIDOL (ISOVUE-300) INJECTION 61%
100.0000 mL | Freq: Once | INTRAVENOUS | Status: AC | PRN
  Administered 2016-12-09: 100 mL via INTRAVENOUS

## 2016-12-09 NOTE — Telephone Encounter (Signed)
LPM 11/6 md 

## 2016-12-11 ENCOUNTER — Telehealth: Payer: Self-pay | Admitting: Gynecologic Oncology

## 2016-12-11 ENCOUNTER — Other Ambulatory Visit: Payer: Self-pay | Admitting: Interventional Cardiology

## 2016-12-11 DIAGNOSIS — F4323 Adjustment disorder with mixed anxiety and depressed mood: Secondary | ICD-10-CM | POA: Diagnosis not present

## 2016-12-11 NOTE — Telephone Encounter (Signed)
Left message to call back.  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  I am recommending either exploratory laparotomy, resection of tumor masses, and repair of hernia Or  Chemotherapy.  If she elects for surgery she would require preoperative clearance from cardiology as she has major coronary disease and this would be a very significant major abdominal surgery. Additionally she would need to see Dr Excell Seltzer and we would need to coordinate the surgery with him for assistance with hernia repair.  Donaciano Eva, MD

## 2016-12-12 ENCOUNTER — Telehealth: Payer: Self-pay | Admitting: Gynecologic Oncology

## 2016-12-12 ENCOUNTER — Encounter (HOSPITAL_COMMUNITY)
Admission: RE | Admit: 2016-12-12 | Discharge: 2016-12-12 | Disposition: A | Discharge status: Home / Self Care | Payer: Self-pay | Source: Ambulatory Visit | Attending: Interventional Cardiology | Admitting: Interventional Cardiology

## 2016-12-12 NOTE — Telephone Encounter (Signed)
Patient informed of tumor marker results, CT scan, and Dr. Serita Grit recommendations. Spoke with patient for 20 minutes.  She states she is going to talk it over with her children but it not kean on chemotherapy bc her body reacts to certain medications and since the cancer is slow growing she does not feel like she has to rush into anything.  She states she would also have to receive cardiac clearance to have surgery which may be challenging to obtain.  All questions answered.  She is to call back when she has made a decision or for any questions or concerns.

## 2016-12-15 ENCOUNTER — Encounter (HOSPITAL_COMMUNITY)
Admission: RE | Admit: 2016-12-15 | Discharge: 2016-12-15 | Disposition: A | Discharge status: Home / Self Care | Payer: Self-pay | Source: Ambulatory Visit | Attending: Interventional Cardiology | Admitting: Interventional Cardiology

## 2016-12-17 ENCOUNTER — Telehealth: Payer: Self-pay | Admitting: Internal Medicine

## 2016-12-17 ENCOUNTER — Telehealth: Payer: Self-pay | Admitting: Gynecologic Oncology

## 2016-12-17 NOTE — Telephone Encounter (Signed)
Informed patient of resutls. She would like to discuss the results in person.  We will discuss this in person next week.  Donaciano Eva, MD

## 2016-12-17 NOTE — Telephone Encounter (Signed)
New message  Patient calling to report BP and HR.   Pt c/o BP issue: STAT if pt c/o blurred vision, one-sided weakness or slurred speech  1. What are your last 5 BP readings? Monday 124/72 HR 67, after rehab 112/70 HR 68. Today 129/58 HR 68.   2. Are you having any other symptoms (ex. Dizziness, headache, blurred vision, passed out)? NO  3. What is your BP issue? N/A

## 2016-12-18 NOTE — Telephone Encounter (Signed)
Patient can't make it on Monday. Do you have something later in the week or next wek.   Sharyn Lull

## 2016-12-18 NOTE — Telephone Encounter (Signed)
Spoke with patient regarding her HR and BP improvement. Patient verbalized understanding of medication adjustment and will continue regimen.

## 2016-12-19 ENCOUNTER — Encounter (HOSPITAL_COMMUNITY): Payer: Self-pay

## 2016-12-22 ENCOUNTER — Encounter (HOSPITAL_COMMUNITY)
Admission: RE | Admit: 2016-12-22 | Discharge: 2016-12-22 | Disposition: A | Discharge status: Home / Self Care | Payer: Self-pay | Source: Ambulatory Visit | Attending: Interventional Cardiology | Admitting: Interventional Cardiology

## 2016-12-23 ENCOUNTER — Telehealth: Payer: Self-pay | Admitting: *Deleted

## 2016-12-23 NOTE — Telephone Encounter (Signed)
Called and left a message for the patient to call the office back.

## 2016-12-24 ENCOUNTER — Telehealth: Payer: Self-pay | Admitting: *Deleted

## 2016-12-24 ENCOUNTER — Encounter (HOSPITAL_COMMUNITY)
Admission: RE | Admit: 2016-12-24 | Discharge: 2016-12-24 | Disposition: A | Discharge status: Home / Self Care | Payer: Self-pay | Source: Ambulatory Visit | Attending: Interventional Cardiology | Admitting: Interventional Cardiology

## 2016-12-24 NOTE — Telephone Encounter (Signed)
Called and spoke with the patient regarding her appt for December 5th at 2:30pm. Patient will call back to let us know if she can come that day

## 2016-12-29 ENCOUNTER — Encounter (HOSPITAL_COMMUNITY)
Admission: RE | Admit: 2016-12-29 | Discharge: 2016-12-29 | Disposition: A | Discharge status: Home / Self Care | Payer: Self-pay | Source: Ambulatory Visit | Attending: Interventional Cardiology | Admitting: Interventional Cardiology

## 2016-12-31 ENCOUNTER — Other Ambulatory Visit: Payer: Self-pay | Admitting: Interventional Cardiology

## 2017-01-02 ENCOUNTER — Encounter (HOSPITAL_COMMUNITY)
Admission: RE | Admit: 2017-01-02 | Discharge: 2017-01-02 | Disposition: A | Discharge status: Home / Self Care | Payer: Self-pay | Source: Ambulatory Visit | Attending: Interventional Cardiology | Admitting: Interventional Cardiology

## 2017-01-05 ENCOUNTER — Encounter: Payer: Self-pay | Admitting: Gynecologic Oncology

## 2017-01-05 ENCOUNTER — Ambulatory Visit: Payer: Medicare Other | Attending: Gynecologic Oncology | Admitting: Gynecologic Oncology

## 2017-01-05 ENCOUNTER — Encounter (HOSPITAL_COMMUNITY): Payer: Self-pay

## 2017-01-05 VITALS — BP 156/53 | HR 68 | Temp 97.7°F | Resp 20 | Ht 60.0 in | Wt 174.8 lb

## 2017-01-05 DIAGNOSIS — Z833 Family history of diabetes mellitus: Secondary | ICD-10-CM | POA: Diagnosis not present

## 2017-01-05 DIAGNOSIS — Z7902 Long term (current) use of antithrombotics/antiplatelets: Secondary | ICD-10-CM | POA: Insufficient documentation

## 2017-01-05 DIAGNOSIS — K469 Unspecified abdominal hernia without obstruction or gangrene: Secondary | ICD-10-CM | POA: Diagnosis not present

## 2017-01-05 DIAGNOSIS — Z7189 Other specified counseling: Secondary | ICD-10-CM

## 2017-01-05 DIAGNOSIS — Z90710 Acquired absence of both cervix and uterus: Secondary | ICD-10-CM | POA: Diagnosis not present

## 2017-01-05 DIAGNOSIS — N183 Chronic kidney disease, stage 3 (moderate): Secondary | ICD-10-CM | POA: Insufficient documentation

## 2017-01-05 DIAGNOSIS — I252 Old myocardial infarction: Secondary | ICD-10-CM | POA: Insufficient documentation

## 2017-01-05 DIAGNOSIS — E669 Obesity, unspecified: Secondary | ICD-10-CM | POA: Insufficient documentation

## 2017-01-05 DIAGNOSIS — Z79899 Other long term (current) drug therapy: Secondary | ICD-10-CM | POA: Insufficient documentation

## 2017-01-05 DIAGNOSIS — Z6834 Body mass index (BMI) 34.0-34.9, adult: Secondary | ICD-10-CM | POA: Diagnosis not present

## 2017-01-05 DIAGNOSIS — Z87891 Personal history of nicotine dependence: Secondary | ICD-10-CM | POA: Insufficient documentation

## 2017-01-05 DIAGNOSIS — C569 Malignant neoplasm of unspecified ovary: Secondary | ICD-10-CM | POA: Diagnosis not present

## 2017-01-05 DIAGNOSIS — I739 Peripheral vascular disease, unspecified: Secondary | ICD-10-CM | POA: Diagnosis not present

## 2017-01-05 DIAGNOSIS — Z8249 Family history of ischemic heart disease and other diseases of the circulatory system: Secondary | ICD-10-CM | POA: Insufficient documentation

## 2017-01-05 DIAGNOSIS — E785 Hyperlipidemia, unspecified: Secondary | ICD-10-CM | POA: Diagnosis not present

## 2017-01-05 DIAGNOSIS — Z8543 Personal history of malignant neoplasm of ovary: Secondary | ICD-10-CM | POA: Diagnosis not present

## 2017-01-05 DIAGNOSIS — Z91030 Bee allergy status: Secondary | ICD-10-CM | POA: Insufficient documentation

## 2017-01-05 DIAGNOSIS — I2510 Atherosclerotic heart disease of native coronary artery without angina pectoris: Secondary | ICD-10-CM | POA: Diagnosis not present

## 2017-01-05 DIAGNOSIS — Z90722 Acquired absence of ovaries, bilateral: Secondary | ICD-10-CM | POA: Diagnosis not present

## 2017-01-05 DIAGNOSIS — Z955 Presence of coronary angioplasty implant and graft: Secondary | ICD-10-CM | POA: Diagnosis not present

## 2017-01-05 DIAGNOSIS — R935 Abnormal findings on diagnostic imaging of other abdominal regions, including retroperitoneum: Secondary | ICD-10-CM | POA: Diagnosis not present

## 2017-01-05 DIAGNOSIS — Z7901 Long term (current) use of anticoagulants: Secondary | ICD-10-CM

## 2017-01-05 DIAGNOSIS — I495 Sick sinus syndrome: Secondary | ICD-10-CM | POA: Insufficient documentation

## 2017-01-05 DIAGNOSIS — Z9049 Acquired absence of other specified parts of digestive tract: Secondary | ICD-10-CM | POA: Insufficient documentation

## 2017-01-05 DIAGNOSIS — G4733 Obstructive sleep apnea (adult) (pediatric): Secondary | ICD-10-CM | POA: Insufficient documentation

## 2017-01-05 DIAGNOSIS — Z950 Presence of cardiac pacemaker: Secondary | ICD-10-CM | POA: Diagnosis not present

## 2017-01-05 DIAGNOSIS — Z823 Family history of stroke: Secondary | ICD-10-CM | POA: Insufficient documentation

## 2017-01-05 DIAGNOSIS — I482 Chronic atrial fibrillation: Secondary | ICD-10-CM | POA: Insufficient documentation

## 2017-01-05 DIAGNOSIS — Z9841 Cataract extraction status, right eye: Secondary | ICD-10-CM | POA: Insufficient documentation

## 2017-01-05 DIAGNOSIS — I130 Hypertensive heart and chronic kidney disease with heart failure and stage 1 through stage 4 chronic kidney disease, or unspecified chronic kidney disease: Secondary | ICD-10-CM | POA: Insufficient documentation

## 2017-01-05 DIAGNOSIS — Z9842 Cataract extraction status, left eye: Secondary | ICD-10-CM | POA: Insufficient documentation

## 2017-01-05 DIAGNOSIS — I214 Non-ST elevation (NSTEMI) myocardial infarction: Secondary | ICD-10-CM | POA: Insufficient documentation

## 2017-01-05 DIAGNOSIS — I4891 Unspecified atrial fibrillation: Secondary | ICD-10-CM | POA: Diagnosis not present

## 2017-01-05 DIAGNOSIS — Z841 Family history of disorders of kidney and ureter: Secondary | ICD-10-CM | POA: Insufficient documentation

## 2017-01-05 DIAGNOSIS — I5042 Chronic combined systolic (congestive) and diastolic (congestive) heart failure: Secondary | ICD-10-CM | POA: Insufficient documentation

## 2017-01-05 DIAGNOSIS — Z48812 Encounter for surgical aftercare following surgery on the circulatory system: Secondary | ICD-10-CM | POA: Insufficient documentation

## 2017-01-05 NOTE — Patient Instructions (Signed)
Please contact Dr Denman George at [phone] when you have made your decision regarding therapy.  Your options for therapy include:  1/ surgery with exploration of the abdomen, removal of all of the masses, possible removal of intestines, possible removal of part of the bladder, repair of the hernias.  2/ chemotherapy with carboplatin and paclitaxel until there was no more remaining cancer on scans  3/ hormonal therapy with alternating progesterone tablets and tamoxifen therapy.    You will need to see Dr's Alred and Veranasi to discuss whether or not you need optimization of your heart health prior to surgery.

## 2017-01-05 NOTE — Progress Notes (Signed)
Consult Note: Gyn-Onc  Consult was requested by Dr. Alvy Bimler for the evaluation of Patty [name] 83 y.o. female  CC:  Chief Complaint  Patient presents with  . Granulosa cell carcinoma of ovary, unspecified laterality (H    Assessment/Plan:  [name]  is a 83 y.o.  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  I am recommending surgical resection and hernia repair. This will involve exploratory laparotomy, radical tumor debulking, possible bowel resection, possible partial cystectomy, and hernia repair with Dr Excell Seltzer and myself.  She will require medical clearance as she has significant cardiac issues.  An alternative to surgery would be chemotherapy with carboplatin and paclitaxel. I discussed that this may not achieve as long a remission as surgical intervention, but would involve a large radical procedure.   A third alternative is hormonal therapy with progestin and tamoxifen in rotation.  She is considering all 3. She will need cardiac evaluation with her EP and interventional cardiology specialists prior to contemplating surgery. We would also require guidance regarding antiplatelet therapy and anti-coagulation therapy perioperatively. If she desires surgery we will coordinate surgery with Dr Excell Seltzer. If she elects for chemotherapy I will have her return to see Dr Alvy Bimler.    HPI: Patty [name] is an 83 year old woman who is seen in consultation at the request of Dr Alvy Bimler for granulosa cell ovarian cancer.   The patient's history began in approximately 2000 when she underwent ex lap, TAH, BSO with Dr Patty [name] at [facility]. She did not receive adjuvant chemotherapy. Postoperatively she had recurrences in the peritoneum in 2004, 2007 and 2011.  The 2004 surgery was with Dr Excell Seltzer and included resection of the recurrent tumor with small bowel resection and colectomy for diverticulosis. She has had a laparoscopic hernia repair with mesh in  2005. In 2007 she underwent resection of pelvic tumor recurrence with Dr Excell Seltzer and ventral hernia repair with mesh.  In 2011 Dr Excell Seltzer performed a laparoscopic resection of suprapubic tumor. Her last CT was in 2010.   She has significant CAD with 6 stents and a history of CHF. She has a pacemaker which she states has a nonfunctioning battery and she is not currently paced. This was placed for tachycadia-bradycardia syndrome. She is obese with OSA. She has atrial fibrillation and is on coumadin.   She had been noting intermittent right lower quadrant pains for 2 weeks. They were not associated with bowel movements or other activity. There are no alleviating factors.   Labs:  CA 125 12/01/16: 35 Inhibin B 11/21/16: 119  CT abd/pelvis 12/09/16: A small suprapubic hernia is seen along the inferior margin of the surgical mass containing a loop of small bowel. No evidence of bowel obstruction or ischemia. Multiple new soft tissue masses are seen in the left suprapubic region and left lower quadrant, consistent with recurrent tumor. Largest in the left suprapubic region measures 3.8 x 2.8 cm, and 3.8 x 3.4 cm in left lower quadrant. There is also a new mass in the central small bowel mesenteric measuring 5.8 x 5.0 cm. These findings are consistent with recurrent carcinoma and peritoneal metastases.  Current Meds:  Outpatient Encounter Medications as of 01/05/2017  Medication Sig  . atorvastatin (LIPITOR) 80 MG tablet TAKE 1 TABLET BY MOUTH EVERY MORNING  . beta carotene w/minerals (OCUVITE) tablet Take 1 tablet by mouth 2 (two) times daily.   . Calcium Carb-Cholecalciferol (CALTRATE 600+D) 600-800 MG-UNIT TABS Take 1 tablet by mouth  every evening.   . clopidogrel (PLAVIX) 75 MG tablet TAKE 1 TABLET BY MOUTH EVERY DAY WITH BREAKFAST  . Coenzyme Q-10 100 MG capsule Take 200 mg by mouth daily.   Marland Kitchen diltiazem (CARDIZEM CD) 120 MG 24 hr capsule TAKE 1 CAPSULE (120 MG TOTAL) BY MOUTH AT BEDTIME.  Marland Kitchen  diltiazem (CARDIZEM CD) 240 MG 24 hr capsule Take 1 capsule (240 mg total) by mouth daily. With breakfast  . furosemide (LASIX) 40 MG tablet Take 80 mg by mouth daily.  . isosorbide mononitrate (IMDUR) 60 MG 24 hr tablet TAKE 1 TABLET (60 MG TOTAL) BY MOUTH DAILY.  Marland Kitchen KLOR-CON M20 20 MEQ tablet TAKE 1 TABLET (20 MEQ TOTAL) BY MOUTH DAILY.  . metoprolol tartrate (LOPRESSOR) 100 MG tablet TAKE 1 TABLET (100 MG TOTAL) BY MOUTH 2 (TWO) TIMES DAILY.  . Multiple Vitamin (MULTIVITAMIN) capsule Take 1 capsule by mouth daily.  . nitroGLYCERIN (NITROSTAT) 0.4 MG SL tablet Place 0.4 mg under the tongue every 5 (five) minutes as needed for chest pain. 3 dose max  . nitroGLYCERIN (NITROSTAT) 0.4 MG SL tablet Place 1 tablet (0.4 mg total) every 5 (five) minutes as needed under the tongue for chest pain. Max 3 doses.  . Omega-3 Fatty Acids (FISH OIL PO) Take 1,200 mg by mouth daily.   Marland Kitchen spironolactone (ALDACTONE) 25 MG tablet Take 25 mg by mouth daily as needed (fluid).   . warfarin (COUMADIN) 2 MG tablet Take as directed by Coumadin Clinic   No facility-administered encounter medications on file as of 01/05/2017.     Allergy:  Allergies  Allergen Reactions  . Bee Venom Anaphylaxis  . Other Nausea And Vomiting and Other (See Comments)    Pain medications cause severe vomiting. Tolerated slow IV morphine drip    Social Hx:   Social History   Socioeconomic History  . Marital status: Widowed    Spouse name: Not on file  . Number of children: 4  . Years of education: Not on file  . Highest education level: Not on file  Social Needs  . Financial resource strain: Not on file  . Food insecurity - worry: Not on file  . Food insecurity - inability: Not on file  . Transportation needs - medical: Not on file  . Transportation needs - non-medical: Not on file  Occupational History  . Occupation: Retired Nurse, learning disability estate  Tobacco Use  . Smoking status: Former Smoker    Packs/day: 1.00    Years:  32.00    Pack years: 32.00    Types: Cigarettes    Last attempt to quit: 02/03/1974    Years since quitting: 42.9  . Smokeless tobacco: Never Used  Substance and Sexual Activity  . Alcohol use: Yes    Alcohol/week: 3.0 oz    Types: 5 Glasses of wine per week    Comment: 2017 WINE WITH DINNER   . Drug use: No  . Sexual activity: No  Other Topics Concern  . Not on file  Social History Narrative   Lives with family.    Past Surgical Hx:  Past Surgical History:  Procedure Laterality Date  . ABDOMINAL HYSTERECTOMY    . CARDIAC CATHETERIZATION  09/03/2007   EF 70%; Failed attempt at PCI to OM  . CARDIAC CATHETERIZATION  11/01/2003   EF 70%  . CARDIAC CATHETERIZATION N/A 12/14/2015   Procedure: Left Heart Cath and Coronary Angiography;  Surgeon: Burnell Blanks, MD;  Location: [place] [facility];  Service:  Cardiovascular;  Laterality: N/A;  . CARDIOVERSION  12/31/2011   Procedure: CARDIOVERSION;  Surgeon: [name], MD;  Location: [facility];  Service: Cardiovascular;  Laterality: N/A;  . CARDIOVERSION N/A 12/31/2011   Procedure: CARDIOVERSION;  Surgeon: [name], MD;  Location: [facility] CATH LAB;  Service: Cardiovascular;  Laterality: N/A;  . CATARACT EXTRACTION, BILATERAL  2015  . CHOLECYSTECTOMY  1980's  . COLON SURGERY  2004   colectomy for diverticulosis  . CORONARY ANGIOPLASTY WITH STENT PLACEMENT  2000; 08/11/2012; 11/12/2012   3 + 2 LAD & CFX; 2nd CFX stent 11/12/2012  . CORONARY ATHERECTOMY N/A 07/15/2016   Procedure: Coronary Atherectomy;  Surgeon: Martinique, Peter M, MD;  Location: [place] [facility];  Service: Cardiovascular;  Laterality: N/A;  . CORONARY BALLOON ANGIOPLASTY N/A 07/14/2016   Procedure: Coronary Balloon Angioplasty;  Surgeon: Martinique, Peter M, MD;  Location: [place] [facility];  Service: Cardiovascular;  Laterality: N/A;  . CORONARY STENT INTERVENTION N/A 07/15/2016   Procedure: Coronary Stent Intervention;  Surgeon: Martinique, Peter M, MD;   Location: [place] [facility];  Service: Cardiovascular;  Laterality: N/A;  . FRACTIONAL FLOW RESERVE WIRE  10/07/2013   Procedure: [place];  Surgeon: [name], MD;  Location: [facility] CATH LAB;  Service: Cardiovascular;;  . granulosa tumor excision  2000; 2003; 2004; 2007   "all in my abdomen including small intestines, outside my ?uterus/etc" (08/11/2012)  . HERNIA REPAIR  2005   "laparoscopic"  . LEFT HEART CATH AND CORONARY ANGIOGRAPHY N/A 07/14/2016   Procedure: Left Heart Cath and Coronary Angiography;  Surgeon: Martinique, Peter M, MD;  Location: [place] [facility];  Service: Cardiovascular;  Laterality: N/A;  . LEFT HEART CATHETERIZATION WITH CORONARY ANGIOGRAM N/A 11/12/2012   Procedure: LEFT HEART CATHETERIZATION WITH CORONARY ANGIOGRAM;  Surgeon: [name], MD;  Location: [facility] CATH LAB;  Service: Cardiovascular;  Laterality: N/A;  . LEFT HEART CATHETERIZATION WITH CORONARY ANGIOGRAM N/A 10/07/2013   Procedure: LEFT HEART CATHETERIZATION WITH CORONARY ANGIOGRAM;  Surgeon: [name], MD;  Location: [facility] CATH LAB;  Service: Cardiovascular;  Laterality: N/A;  . LEFT HEART CATHETERIZATION WITH CORONARY ANGIOGRAM N/A 12/14/2013   Procedure: LEFT HEART CATHETERIZATION WITH CORONARY ANGIOGRAM;  Surgeon: Sinclair Grooms, MD;  Location: [facility] CATH LAB;  Service: Cardiovascular;  Laterality: N/A;  . LEFT HEART CATHETERIZATION WITH CORONARY ANGIOGRAM N/A 05/16/2014   Procedure: LEFT HEART CATHETERIZATION WITH CORONARY ANGIOGRAM;  Surgeon: Sherren Mocha, MD;  Location: [facility] CATH LAB;  Service: Cardiovascular;  Laterality: N/A;  . PERCUTANEOUS CORONARY INTERVENTION-BALLOON ONLY  08/04/2012   Procedure: PERCUTANEOUS CORONARY INTERVENTION-BALLOON ONLY;  Surgeon: [name], MD;  Location: [facility] CATH LAB;  Service: Cardiovascular;;  . PERCUTANEOUS CORONARY ROTOBLATOR INTERVENTION (PCI-R) N/A 08/11/2012   Procedure: PERCUTANEOUS CORONARY ROTOBLATOR INTERVENTION (PCI-R);   Surgeon: [name], MD;  Location: [facility] CATH LAB;  Service: Cardiovascular;  Laterality: N/A;  . PERCUTANEOUS CORONARY STENT INTERVENTION (PCI-S)  10/07/2013   Procedure: PERCUTANEOUS CORONARY STENT INTERVENTION (PCI-S);  Surgeon: [name], MD;  Location: [facility] CATH LAB;  Service: Cardiovascular;;  . PERMANENT PACEMAKER INSERTION N/A 03/16/2012   [name] (SJM) leadless pacemaker (LEADLESS II STUDY PATEINT)  . SALIVARY GLAND SURGERY  2000's   "had a little lump removed; granulosa related; it was benign" (08/11/2012)  . TEE WITHOUT CARDIOVERSION  12/31/2011   Procedure: TRANSESOPHAGEAL ECHOCARDIOGRAM (TEE);  Surgeon: [name], MD;  Location: [place];  Service: Cardiovascular;  Laterality: N/A;  . VARICOSE VEIN  SURGERY Bilateral 1977    Past Medical Hx:  Past Medical History:  Diagnosis Date  . Chronic anticoagulation - coumadin, CHADS2VASC=6 05/17/2015  . CKD (chronic kidney disease), stage III ([place])   . Combined systolic and diastolic heart failure ([place])   . Coronary artery disease    a. s/p multiple stents - stenting x 2 to the LAD and x 1 to the LCx in 2000, rotational arthrectomy to proximal LCx and DES to LAD in 2014 and DES to LCx in 2014, along with DES to LAD for re-in-stent stenosis in 2015, and DES to ostial LAD in 2016. b. 07/2016 - orbital atherectomy & DES to mid LCx.  . Diverticulitis   . Granulosa cell carcinoma ([place])    abd; last episode was in 2009  . Hyperlipidemia   . Hypertension   . LBBB (left bundle branch block)       . Myocardial infarction ([place]) 2002  . Obesity   . OSA on CPAP   . Pacemaker failure    a. Prior leadless PPM with premature battery failure, being managed conservatively without replacement.  . Peripheral vascular disease ([place])   . Permanent atrial fibrillation ([place]) 2013  . Renal artery stenosis ([place])   . Tachycardia-bradycardia syndrome ([place])    a. s/p leadless pacemaker ([name]) implanted by Dr [name]  . Varicose  veins     Past Gynecological History:  Granulosa cell tumor of the ovary No LMP recorded. Patient has had a hysterectomy.  Family Hx:  Family History  Problem Relation Age of Onset  . Stroke Mother   . Heart attack Father   . Diabetes Father   . Hypertension Father   . Heart attack Brother   . Diabetes Brother   . Hypertension Brother   . Kidney failure Brother     Review of Systems:  Constitutional  Feels well,    ENT Normal appearing ears and nares bilaterally Skin/Breast  No rash, sores, jaundice, itching, dryness Cardiovascular  No chest pain, shortness of breath, or edema  Pulmonary  No cough or wheeze.  Gastro Intestinal  No nausea, vomitting, or diarrhoea. No bright red blood per rectum, no abdominal pain, change in bowel movement, or constipation.  Genito Urinary  No frequency, urgency, dysuria, + pelvic pain Musculo Skeletal  No myalgia, arthralgia, joint swelling or pain  Neurologic  No weakness, numbness, change in gait,  Psychology  No depression, anxiety, insomnia.   Vitals:  Blood pressure (!) 156/53, pulse 68, temperature 97.7 F (36.5 C), temperature source Oral, resp. rate 20, height 5' (1.524 m), weight 174 lb 12.8 oz (79.3 kg), SpO2 96 %.  Physical Exam: WD in NAD Neck  Supple NROM, without any enlargements.  Lymph Node Survey No cervical supraclavicular or inguinal adenopathy Cardiovascular  Pulse normal rate, regularity and rhythm. S1 and S2 normal.  Lungs  Clear to auscultation bilateraly, without wheezes/crackles/rhonchi. Good air movement.  Skin  No rash/lesions/breakdown  Psychiatry  Alert and oriented to person, place, and time  Abdomen  Normoactive bowel sounds, abdomen soft, non-tender and obese without evidence of hernia.  Back No CVA tenderness Genito Urinary  Vulva/vagina: Normal external female genitalia.   No lesions. No discharge or bleeding.  Bladder/urethra:  No lesions or masses, well supported bladder  Vagina:  normal, surgically absent uterus and cervix.   Adnexa: no palpable masses. Rectal  Good tone, no masses no cul de sac nodularity.  Extremities  No bilateral cyanosis, clubbing or edema.   30 minutes  of direct face to face counseling time was spent with the patient. This included discussion about prognosis, therapy recommendations.  Donaciano Eva, MD  01/05/2017, 3:50 PM

## 2017-01-06 ENCOUNTER — Telehealth: Payer: Self-pay | Admitting: Internal Medicine

## 2017-01-06 ENCOUNTER — Telehealth: Payer: Self-pay | Admitting: Interventional Cardiology

## 2017-01-06 NOTE — Telephone Encounter (Signed)
Will call patient tomorrow morning as requested.

## 2017-01-06 NOTE — Telephone Encounter (Signed)
Patient calling, would like a call back. 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 Patient states she prefers you call tomorrow morning.

## 2017-01-07 ENCOUNTER — Ambulatory Visit (INDEPENDENT_AMBULATORY_CARE_PROVIDER_SITE_OTHER): Payer: Medicare Other | Admitting: Pharmacist

## 2017-01-07 DIAGNOSIS — Z5181 Encounter for therapeutic drug level monitoring: Secondary | ICD-10-CM

## 2017-01-07 DIAGNOSIS — I4891 Unspecified atrial fibrillation: Secondary | ICD-10-CM

## 2017-01-07 LAB — POCT INR: INR: 2.4

## 2017-01-07 NOTE — Telephone Encounter (Addendum)
Went for her regular checkup with Dr Sharla Kidney cancer doctor, and her CA is back.  She had an MRI and now needs to be taken care of.  There are 3 options: 1. Surgery-extensive(not first choice), 2.chemo([place] feels she can tolerate), 3. Hormonal therapy.  Very non-aggressive cancer.  Dr. Denman George would like for her to discuss with Dr [name] and Dr Irish Lack about her options.  I will discuss with Dr. [name] and give his recommendations and then forward to Dr. Irish Lack.  Patient here for INR and stopped by POD.  She wanted to add that she has been SOB for over a week.  It has been a gradual progression.

## 2017-01-07 NOTE — Patient Instructions (Signed)
Continue on same dose of 2 mg daily except 1 mg on Mondays and Fridays. Keep dark greens consistent. Recheck INR in 6 weeks. Main [phone]. [facility] # [phone]

## 2017-01-07 NOTE — Telephone Encounter (Addendum)
Patient calling to report that Dr. 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   Patient states that Dr. Denman George told her that there are 3 options:  1) Surgical resection and hernia repair. This will involve exploratory laparotomy, radical tumor debulking, possible bowel resection, possible partial cystectomy, and hernia repair. She will require medical clearance as she has significant cardiac issues.  2) An alternative to surgery would be chemotherapy with carboplatin and paclitaxel. This may not achieve as long a remission as surgical intervention, but would involve a large radical procedure.   3) A third alternative is hormonal therapy with progestin and tamoxifen in rotation.  She is considering all 3. Per Dr. Denman George, she will need cardiac evaluation with her EP and interventional cardiology specialists prior to contemplating surgery. Dr. Denman George would also require guidance regarding antiplatelet therapy and anti-coagulation therapy perioperatively.  Patient denies having any cardiac symptoms other than being SOB on exertion. Patient requesting an appointment with Dr. Irish Lack to go over everything with him. Appointment made for 12/11 at 3:00 PM.

## 2017-01-07 NOTE — Telephone Encounter (Signed)
Discussed with Dr [name] and with her SOB we will add her on next week to discuss both issues

## 2017-01-07 NOTE — Telephone Encounter (Signed)
Left message for patient to call back  

## 2017-01-07 NOTE — Telephone Encounter (Signed)
F/u Message ° °Pt returning RN call .please call back to discuss  °

## 2017-01-09 ENCOUNTER — Encounter (HOSPITAL_COMMUNITY)
Admission: RE | Admit: 2017-01-09 | Discharge: 2017-01-09 | Disposition: A | Discharge status: Home / Self Care | Payer: Self-pay | Source: Ambulatory Visit | Attending: Interventional Cardiology | Admitting: Interventional Cardiology

## 2017-01-12 ENCOUNTER — Encounter: Payer: Medicare Other | Admitting: Internal Medicine

## 2017-01-12 ENCOUNTER — Encounter (HOSPITAL_COMMUNITY): Payer: Self-pay

## 2017-01-13 ENCOUNTER — Ambulatory Visit: Payer: Medicare Other | Admitting: Interventional Cardiology

## 2017-01-14 ENCOUNTER — Encounter (HOSPITAL_COMMUNITY)
Admission: RE | Admit: 2017-01-14 | Discharge: 2017-01-14 | Disposition: A | Discharge status: Home / Self Care | Payer: Self-pay | Source: Ambulatory Visit | Attending: Interventional Cardiology | Admitting: Interventional Cardiology

## 2017-01-14 NOTE — Progress Notes (Signed)
Cardiology Office Note   Date:  01/15/2017   ID:  Patty [name], DOB 02-25-1930, MRN [id number]  PCP:  [name], MD    No chief complaint on file. CAD/preoperative eval   Wt Readings from Last 3 Encounters:  01/15/17 179 lb 12.8 oz (81.6 kg)  01/05/17 174 lb 12.8 oz (79.3 kg)  12/01/16 177 lb 14.4 oz (80.7 kg)       History of Present Illness: Patty [name] is a 83 y.o. female  with history of CAD s/p multiple stents, chronic combined CHF, permanent atrial fib/flutter on Coumadin with tachy-brady syndrome s/p leadless PPM (with premature battery failure no longer active - being followed conservatively for now), probable CKD III per labs, granulosa cell carcinoma, HTN, HLD, LBBB, varicose veins, renal artery stenosis, PVD, diverticulitis, OSA who presents for f/u.  She has significant CAD with stenting x 2 to the LAD and x 1 to the LCx in 2000, rotational arthrectomy to proximal LCx and DES to LAD in 2014 and DES to LCx in 2014, along with DES to LAD for re-in-stent stenosis in 2015, and DES to ostial LAD in 2016. PVD is listed in chart but I cannot find further notes regarding this as LE doppler 2014 showed no evidence of significant LE disease   She was admitted in June 2018 with exertional dyspnea and chest tightness and palpitations. She was found to be in rapid atrial fib/flutter and minimally elevated troponin. 2D Echo 07/11/16 showed moderate focal basal and mild concentric hypertrophy, EF 50-55%, no RWMA, septal dyssyngery c/w LBBB, mild AI, massively dilated LA, trivial TR, mild PR. Coumadin was held in prep for cath which was performed 07/14/16 showing 100% CTO of OM1 with L-L collaterals, 90% distal Cx, 100% distal PLOM, otherwise nonobstructive disease, normal LVEDP - unsuccessful PCI of the distal Cx due to inability to cross the lesion with a balloon. She was brought back 07/15/16 and underwent successful orbital atherectomy &DES to mid LCx. She was discharged on  ASA, Plavix and Coumadin with plan to discontinue ASA after 1 month. Last labs 07/2016 showed K 3.2-3.9, Cr 1.01, Hgb 9.4-10.5 (previously 10.7-12.5 in 12/2015), LDL 60.  She has more granulosa cell tumors and surgery is being considered. Dr. Denman George is the surgeon. Her three options are surgery, chemotherapy, or hormonal treatment.  She will see me and Dr. [name] for clearance.  The patient is open to all options.  Denies : Chest pain. Dizziness. Leg edema. Nitroglycerin use. Orthopnea. Palpitations. Paroxysmal nocturnal dyspnea. Shortness of breath. Syncope.     Past Medical History:  Diagnosis Date  . Chronic anticoagulation - coumadin, CHADS2VASC=6 05/17/2015  . CKD (chronic kidney disease), stage III ([place])   . Combined systolic and diastolic heart failure ([place])   . Coronary artery disease    a. s/p multiple stents - stenting x 2 to the LAD and x 1 to the LCx in 2000, rotational arthrectomy to proximal LCx and DES to LAD in 2014 and DES to LCx in 2014, along with DES to LAD for re-in-stent stenosis in 2015, and DES to ostial LAD in 2016. b. 07/2016 - orbital atherectomy & DES to mid LCx.  . Diverticulitis   . Granulosa cell carcinoma ([place])    abd; last episode was in 2009  . Hyperlipidemia   . Hypertension   . LBBB (left bundle branch block)       . Myocardial infarction ([place]) 2002  . Obesity   . OSA on CPAP   .  Pacemaker failure    a. Prior leadless PPM with premature battery failure, being managed conservatively without replacement.  . Peripheral vascular disease ([place])   . Permanent atrial fibrillation ([place]) 2013  . Renal artery stenosis ([place])   . Tachycardia-bradycardia syndrome ([place])    a. s/p leadless pacemaker ([name]) implanted by Dr [name]  . Varicose veins     Past Surgical History:  Procedure Laterality Date  . ABDOMINAL HYSTERECTOMY    . CARDIAC CATHETERIZATION  09/03/2007   EF 70%; Failed attempt at PCI to OM  . CARDIAC CATHETERIZATION  11/01/2003   EF 70%  .  CARDIAC CATHETERIZATION N/A 12/14/2015   Procedure: Left Heart Cath and Coronary Angiography;  Surgeon: Burnell Blanks, MD;  Location: [place] [facility] LAB;  Service: Cardiovascular;  Laterality: N/A;  . CARDIOVERSION  12/31/2011   Procedure: CARDIOVERSION;  Surgeon: [name], MD;  Location: [facility] ENDOSCOPY;  Service: Cardiovascular;  Laterality: N/A;  . CARDIOVERSION N/A 12/31/2011   Procedure: CARDIOVERSION;  Surgeon: [name], MD;  Location: [facility] CATH LAB;  Service: Cardiovascular;  Laterality: N/A;  . CATARACT EXTRACTION, BILATERAL  2015  . CHOLECYSTECTOMY  1980's  . COLON SURGERY  2004   colectomy for diverticulosis  . CORONARY ANGIOPLASTY WITH STENT PLACEMENT  2000; 08/11/2012; 11/12/2012   3 + 2 LAD & CFX; 2nd CFX stent 11/12/2012  . CORONARY ATHERECTOMY N/A 07/15/2016   Procedure: Coronary Atherectomy;  Surgeon: Martinique, Peter M, MD;  Location: [place] CV LAB;  Service: Cardiovascular;  Laterality: N/A;  . CORONARY BALLOON ANGIOPLASTY N/A 07/14/2016   Procedure: Coronary Balloon Angioplasty;  Surgeon: Martinique, Peter M, MD;  Location: [place] [facility] LAB;  Service: Cardiovascular;  Laterality: N/A;  . CORONARY STENT INTERVENTION N/A 07/15/2016   Procedure: Coronary Stent Intervention;  Surgeon: Martinique, Peter M, MD;  Location: [place] CV LAB;  Service: Cardiovascular;  Laterality: N/A;  . FRACTIONAL FLOW RESERVE WIRE  10/07/2013   Procedure: [place];  Surgeon: [name], MD;  Location: [facility] CATH LAB;  Service: Cardiovascular;;  . granulosa tumor excision  2000; 2003; 2004; 2007   "all in my abdomen including small intestines, outside my ?uterus/etc" (08/11/2012)  . HERNIA REPAIR  2005   "laparoscopic"  . LEFT HEART CATH AND CORONARY ANGIOGRAPHY N/A 07/14/2016   Procedure: Left Heart Cath and Coronary Angiography;  Surgeon: Martinique, Peter M, MD;  Location: [place] CV LAB;  Service: Cardiovascular;  Laterality: N/A;  . LEFT HEART  CATHETERIZATION WITH CORONARY ANGIOGRAM N/A 11/12/2012   Procedure: LEFT HEART CATHETERIZATION WITH CORONARY ANGIOGRAM;  Surgeon: [name], MD;  Location: [facility] CATH LAB;  Service: Cardiovascular;  Laterality: N/A;  . LEFT HEART CATHETERIZATION WITH CORONARY ANGIOGRAM N/A 10/07/2013   Procedure: LEFT HEART CATHETERIZATION WITH CORONARY ANGIOGRAM;  Surgeon: [name], MD;  Location: [facility] CATH LAB;  Service: Cardiovascular;  Laterality: N/A;  . LEFT HEART CATHETERIZATION WITH CORONARY ANGIOGRAM N/A 12/14/2013   Procedure: LEFT HEART CATHETERIZATION WITH CORONARY ANGIOGRAM;  Surgeon: Sinclair Grooms, MD;  Location: [facility] CATH LAB;  Service: Cardiovascular;  Laterality: N/A;  . LEFT HEART CATHETERIZATION WITH CORONARY ANGIOGRAM N/A 05/16/2014   Procedure: LEFT HEART CATHETERIZATION WITH CORONARY ANGIOGRAM;  Surgeon: Sherren Mocha, MD;  Location: [facility] CATH LAB;  Service: Cardiovascular;  Laterality: N/A;  . PERCUTANEOUS CORONARY INTERVENTION-BALLOON ONLY  08/04/2012   Procedure: PERCUTANEOUS CORONARY INTERVENTION-BALLOON ONLY;  Surgeon: [name], MD;  Location: [facility] CATH LAB;  Service: Cardiovascular;;  . PERCUTANEOUS  CORONARY ROTOBLATOR INTERVENTION (PCI-R) N/A 08/11/2012   Procedure: PERCUTANEOUS CORONARY ROTOBLATOR INTERVENTION (PCI-R);  Surgeon: [name], MD;  Location: [facility] CATH LAB;  Service: Cardiovascular;  Laterality: N/A;  . PERCUTANEOUS CORONARY STENT INTERVENTION (PCI-S)  10/07/2013   Procedure: PERCUTANEOUS CORONARY STENT INTERVENTION (PCI-S);  Surgeon: [name], MD;  Location: [facility] CATH LAB;  Service: Cardiovascular;;  . PERMANENT PACEMAKER INSERTION N/A 03/16/2012   [name] (SJM) leadless pacemaker (LEADLESS II STUDY PATEINT)  . SALIVARY GLAND SURGERY  2000's   "had a little lump removed; granulosa related; it was benign" (08/11/2012)  . TEE WITHOUT CARDIOVERSION  12/31/2011   Procedure: TRANSESOPHAGEAL ECHOCARDIOGRAM (TEE);  Surgeon: [name],  MD;  Location: [place];  Service: Cardiovascular;  Laterality: N/A;  . VARICOSE VEIN SURGERY Bilateral 1977     Current Outpatient Medications  Medication Sig Dispense Refill  . atorvastatin (LIPITOR) 80 MG tablet TAKE 1 TABLET BY MOUTH EVERY MORNING 30 tablet 9  . beta carotene w/minerals (OCUVITE) tablet Take 1 tablet by mouth 2 (two) times daily.     . Calcium Carb-Cholecalciferol (CALTRATE 600+D) 600-800 MG-UNIT TABS Take 1 tablet by mouth every evening.     . clopidogrel (PLAVIX) 75 MG tablet TAKE 1 TABLET BY MOUTH EVERY DAY WITH BREAKFAST 30 tablet 5  . Coenzyme Q-10 100 MG capsule Take 200 mg by mouth daily.     Marland Kitchen diltiazem (CARDIZEM CD) 120 MG 24 hr capsule TAKE 1 CAPSULE (120 MG TOTAL) BY MOUTH AT BEDTIME. 90 capsule 3  . diltiazem (CARDIZEM CD) 240 MG 24 hr capsule Take 1 capsule (240 mg total) by mouth daily. With breakfast 90 capsule 3  . furosemide (LASIX) 40 MG tablet Take 80 mg by mouth daily.    . isosorbide mononitrate (IMDUR) 60 MG 24 hr tablet TAKE 1 TABLET (60 MG TOTAL) BY MOUTH DAILY. 30 tablet 9  . KLOR-CON M20 20 MEQ tablet TAKE 1 TABLET (20 MEQ TOTAL) BY MOUTH DAILY. 30 tablet 9  . metoprolol tartrate (LOPRESSOR) 100 MG tablet TAKE 1 TABLET (100 MG TOTAL) BY MOUTH 2 (TWO) TIMES DAILY. 60 tablet 11  . Multiple Vitamin (MULTIVITAMIN) capsule Take 1 capsule by mouth daily.    . nitroGLYCERIN (NITROSTAT) 0.4 MG SL tablet Place 0.4 mg under the tongue every 5 (five) minutes as needed for chest pain. 3 dose max    . nitroGLYCERIN (NITROSTAT) 0.4 MG SL tablet Place 1 tablet (0.4 mg total) every 5 (five) minutes as needed under the tongue for chest pain. Max 3 doses. 25 tablet 3  . Omega-3 Fatty Acids (FISH OIL PO) Take 1,200 mg by mouth daily.     Marland Kitchen spironolactone (ALDACTONE) 25 MG tablet Take 25 mg by mouth daily as needed (fluid).  45 tablet 11  . warfarin (COUMADIN) 2 MG tablet Take as directed by Coumadin Clinic 30 tablet 3   No current facility-administered  medications for this visit.     Allergies:   Bee venom and Other    Social History:  The patient  reports that she quit smoking about 42 years ago. Her smoking use included cigarettes. She has a 32.00 pack-year smoking history. she has never used smokeless tobacco. She reports that she drinks about 3.0 oz of alcohol per week. She reports that she does not use drugs.   Family History:  The patient's family history includes Diabetes in her brother and father; Heart attack in her brother and father; Hypertension in her brother and father; Kidney failure in  her brother; Stroke in her mother.    ROS:  Please see the history of present illness.   Otherwise, review of systems are positive for abdominal tumor.   All other systems are reviewed and negative.    PHYSICAL EXAM: VS:  BP 132/72   Pulse 68   Ht 5' (1.524 m)   Wt 179 lb 12.8 oz (81.6 kg)   SpO2 96%   BMI 35.11 kg/m  , BMI Body mass index is 35.11 kg/m. GEN: Well nourished, well developed, in no acute distress  HEENT: normal  Neck: no JVD, carotid bruits, or masses Cardiac: RRR; 2/6 systolic murmur, no rubs, or gallops,no edema  Respiratory:  clear to auscultation bilaterally, normal work of breathing GI: soft, nontender, nondistended, + BS MS: no deformity or atrophy  Skin: warm and dry, no rash Neuro:  Strength and sensation are intact Psych: euthymic mood, full affect     Recent Labs: 07/10/2016: B Natriuretic Peptide 361.3 07/11/2016: TSH 2.360 07/12/2016: ALT 29; Magnesium 1.9 07/16/2016: Hemoglobin 9.4; Platelets 350; Potassium 3.8; Sodium 137 12/01/2016: BUN 19.6; Creatinine 0.9   Lipid Panel    Component Value Date/Time   CHOL 107 07/11/2016 0926   CHOL 160 02/13/2016 0855   TRIG 46 07/11/2016 0926   HDL 38 (L) 07/11/2016 0926   HDL 68 02/13/2016 0855   CHOLHDL 2.8 07/11/2016 0926   VLDL 9 07/11/2016 0926   LDLCALC 60 07/11/2016 0926   LDLCALC 75 02/13/2016 0855     Other studies Reviewed: Additional  studies/ records that were reviewed today with results demonstrating: 6/18 cath report reviewed- rotational atherectomy/PCI.   ASSESSMENT AND PLAN:  1. CAD: No angina on medical therapy.  No further testing needed at this time for preop eval.  She needs to stay on clopidogrel for 6 months without interruption before surgery.  She may elect to stay on for 12 months prior to surgery.  She has options and may elect to have nonsurgical treatment.  2. AFib: Rate controlled.  Pacer not functioning. She will see Dr. [name].  No sx of bradycardia.  3. Anticoagulated: COntinue warfarin for stroke prevention.  4. HTN: COntrolled. Continue current meds.  Checked at rehab.   Current medicines are reviewed at length with the patient today.  The patient concerns regarding her medicines were addressed.  The following changes have been made:  No change  Labs/ tests ordered today include:  No orders of the defined types were placed in this encounter.   Recommend 150 minutes/week of aerobic exercise Low fat, low carb, high fiber diet recommended  Disposition:   FU in 6 months   Signed, Larae Grooms, MD  01/15/2017 2:47 PM    [address], [place], [place]  [address] Phone: [phone]; Fax: [phone]

## 2017-01-15 ENCOUNTER — Encounter: Payer: Self-pay | Admitting: Interventional Cardiology

## 2017-01-15 ENCOUNTER — Ambulatory Visit (INDEPENDENT_AMBULATORY_CARE_PROVIDER_SITE_OTHER): Payer: Medicare Other | Admitting: Interventional Cardiology

## 2017-01-15 VITALS — BP 132/72 | HR 68 | Ht 60.0 in | Wt 179.8 lb

## 2017-01-15 DIAGNOSIS — I209 Angina pectoris, unspecified: Secondary | ICD-10-CM

## 2017-01-15 DIAGNOSIS — I10 Essential (primary) hypertension: Secondary | ICD-10-CM | POA: Diagnosis not present

## 2017-01-15 DIAGNOSIS — Z7901 Long term (current) use of anticoagulants: Secondary | ICD-10-CM

## 2017-01-15 DIAGNOSIS — I25118 Atherosclerotic heart disease of native coronary artery with other forms of angina pectoris: Secondary | ICD-10-CM | POA: Diagnosis not present

## 2017-01-15 DIAGNOSIS — I200 Unstable angina: Secondary | ICD-10-CM

## 2017-01-15 DIAGNOSIS — Z950 Presence of cardiac pacemaker: Secondary | ICD-10-CM | POA: Diagnosis not present

## 2017-01-15 DIAGNOSIS — I4891 Unspecified atrial fibrillation: Secondary | ICD-10-CM | POA: Diagnosis not present

## 2017-01-15 NOTE — Patient Instructions (Signed)

## 2017-01-16 ENCOUNTER — Encounter (HOSPITAL_COMMUNITY)
Admission: RE | Admit: 2017-01-16 | Discharge: 2017-01-16 | Disposition: A | Discharge status: Home / Self Care | Payer: Medicare Other | Source: Ambulatory Visit | Attending: Interventional Cardiology | Admitting: Interventional Cardiology

## 2017-01-16 ENCOUNTER — Encounter: Payer: Self-pay | Admitting: Internal Medicine

## 2017-01-16 ENCOUNTER — Ambulatory Visit (INDEPENDENT_AMBULATORY_CARE_PROVIDER_SITE_OTHER): Payer: Medicare Other | Admitting: Internal Medicine

## 2017-01-16 VITALS — BP 128/62 | HR 68 | Ht 60.0 in | Wt 181.6 lb

## 2017-01-16 DIAGNOSIS — I10 Essential (primary) hypertension: Secondary | ICD-10-CM

## 2017-01-16 DIAGNOSIS — I495 Sick sinus syndrome: Secondary | ICD-10-CM

## 2017-01-16 DIAGNOSIS — I481 Persistent atrial fibrillation: Secondary | ICD-10-CM

## 2017-01-16 DIAGNOSIS — I4819 Other persistent atrial fibrillation: Secondary | ICD-10-CM

## 2017-01-16 DIAGNOSIS — I200 Unstable angina: Secondary | ICD-10-CM

## 2017-01-16 NOTE — Progress Notes (Signed)
PCP: [name], MD Primary Cardiologist: Dr Irish Lack Primary EP: Dr Aurea Graff Patty [name] is a 83 y.o. female who presents today for routine electrophysiology followup.  Since last being seen in our clinic, the patient reports doing reasonably well. She has malignancy for which she is deciding treatment.  She presents today for further discussions. Today, she denies symptoms of palpitations, chest pain  lower extremity edema, dizziness, presyncope, or syncope. SOB is at baseline. The patient is otherwise without complaint today.   Past Medical History:  Diagnosis Date  . Chronic anticoagulation - coumadin, CHADS2VASC=6 05/17/2015  . CKD (chronic kidney disease), stage III ([place])   . Combined systolic and diastolic heart failure ([place])   . Coronary artery disease    a. s/p multiple stents - stenting x 2 to the LAD and x 1 to the LCx in 2000, rotational arthrectomy to proximal LCx and DES to LAD in 2014 and DES to LCx in 2014, along with DES to LAD for re-in-stent stenosis in 2015, and DES to ostial LAD in 2016. b. 07/2016 - orbital atherectomy & DES to mid LCx.  . Diverticulitis   . Granulosa cell carcinoma ([place])    abd; last episode was in 2009  . Hyperlipidemia   . Hypertension   . LBBB (left bundle branch block)       . Myocardial infarction ([place]) 2002  . Obesity   . OSA on CPAP   . Pacemaker failure    a. Prior leadless PPM with premature battery failure, being managed conservatively without replacement.  . Peripheral vascular disease ([place])   . Permanent atrial fibrillation ([place]) 2013  . Renal artery stenosis ([place])   . Tachycardia-bradycardia syndrome ([place])    a. s/p leadless pacemaker ([name]) implanted by Dr [name]  . Varicose veins    Past Surgical History:  Procedure Laterality Date  . ABDOMINAL HYSTERECTOMY    . CARDIAC CATHETERIZATION  09/03/2007   EF 70%; Failed attempt at PCI to OM  . CARDIAC CATHETERIZATION  11/01/2003   EF 70%  . CARDIAC CATHETERIZATION N/A  12/14/2015   Procedure: Left Heart Cath and Coronary Angiography;  Surgeon: Burnell Blanks, MD;  Location: [place] CV LAB;  Service: Cardiovascular;  Laterality: N/A;  . CARDIOVERSION  12/31/2011   Procedure: CARDIOVERSION;  Surgeon: [name], MD;  Location: [facility] ENDOSCOPY;  Service: Cardiovascular;  Laterality: N/A;  . CARDIOVERSION N/A 12/31/2011   Procedure: CARDIOVERSION;  Surgeon: [name], MD;  Location: [facility] CATH LAB;  Service: Cardiovascular;  Laterality: N/A;  . CATARACT EXTRACTION, BILATERAL  2015  . CHOLECYSTECTOMY  1980's  . COLON SURGERY  2004   colectomy for diverticulosis  . CORONARY ANGIOPLASTY WITH STENT PLACEMENT  2000; 08/11/2012; 11/12/2012   3 + 2 LAD & CFX; 2nd CFX stent 11/12/2012  . CORONARY ATHERECTOMY N/A 07/15/2016   Procedure: Coronary Atherectomy;  Surgeon: Martinique, Peter M, MD;  Location: [place] CV LAB;  Service: Cardiovascular;  Laterality: N/A;  . CORONARY BALLOON ANGIOPLASTY N/A 07/14/2016   Procedure: Coronary Balloon Angioplasty;  Surgeon: Martinique, Peter M, MD;  Location: [place] [facility] LAB;  Service: Cardiovascular;  Laterality: N/A;  . CORONARY STENT INTERVENTION N/A 07/15/2016   Procedure: Coronary Stent Intervention;  Surgeon: Martinique, Peter M, MD;  Location: [place] CV LAB;  Service: Cardiovascular;  Laterality: N/A;  . FRACTIONAL FLOW RESERVE WIRE  10/07/2013   Procedure: [place];  Surgeon: [name], MD;  Location: [facility] CATH LAB;  Service: Cardiovascular;;  . granulosa tumor excision  2000; 2003; 2004; 2007   "all in my abdomen including small intestines, outside my ?uterus/etc" (08/11/2012)  . HERNIA REPAIR  2005   "laparoscopic"  . LEFT HEART CATH AND CORONARY ANGIOGRAPHY N/A 07/14/2016   Procedure: Left Heart Cath and Coronary Angiography;  Surgeon: Martinique, Peter M, MD;  Location: [place] CV LAB;  Service: Cardiovascular;  Laterality: N/A;  . LEFT HEART CATHETERIZATION WITH CORONARY  ANGIOGRAM N/A 11/12/2012   Procedure: LEFT HEART CATHETERIZATION WITH CORONARY ANGIOGRAM;  Surgeon: [name], MD;  Location: [facility] CATH LAB;  Service: Cardiovascular;  Laterality: N/A;  . LEFT HEART CATHETERIZATION WITH CORONARY ANGIOGRAM N/A 10/07/2013   Procedure: LEFT HEART CATHETERIZATION WITH CORONARY ANGIOGRAM;  Surgeon: [name], MD;  Location: [facility] CATH LAB;  Service: Cardiovascular;  Laterality: N/A;  . LEFT HEART CATHETERIZATION WITH CORONARY ANGIOGRAM N/A 12/14/2013   Procedure: LEFT HEART CATHETERIZATION WITH CORONARY ANGIOGRAM;  Surgeon: Sinclair Grooms, MD;  Location: [facility] CATH LAB;  Service: Cardiovascular;  Laterality: N/A;  . LEFT HEART CATHETERIZATION WITH CORONARY ANGIOGRAM N/A 05/16/2014   Procedure: LEFT HEART CATHETERIZATION WITH CORONARY ANGIOGRAM;  Surgeon: Sherren Mocha, MD;  Location: [place] CATH LAB;  Service: Cardiovascular;  Laterality: N/A;  . PERCUTANEOUS CORONARY INTERVENTION-BALLOON ONLY  08/04/2012   Procedure: PERCUTANEOUS CORONARY INTERVENTION-BALLOON ONLY;  Surgeon: [name], MD;  Location: [facility] CATH LAB;  Service: Cardiovascular;;  . PERCUTANEOUS CORONARY ROTOBLATOR INTERVENTION (PCI-R) N/A 08/11/2012   Procedure: PERCUTANEOUS CORONARY ROTOBLATOR INTERVENTION (PCI-R);  Surgeon: [name], MD;  Location: [facility] CATH LAB;  Service: Cardiovascular;  Laterality: N/A;  . PERCUTANEOUS CORONARY STENT INTERVENTION (PCI-S)  10/07/2013   Procedure: PERCUTANEOUS CORONARY STENT INTERVENTION (PCI-S);  Surgeon: [name], MD;  Location: [facility] CATH LAB;  Service: Cardiovascular;;  . PERMANENT PACEMAKER INSERTION N/A 03/16/2012   [name] (SJM) leadless pacemaker (LEADLESS II STUDY PATEINT)  . SALIVARY GLAND SURGERY  2000's   "had a little lump removed; granulosa related; it was benign" (08/11/2012)  . TEE WITHOUT CARDIOVERSION  12/31/2011   Procedure: TRANSESOPHAGEAL ECHOCARDIOGRAM (TEE);  Surgeon: [name], MD;  Location: [place];   Service: Cardiovascular;  Laterality: N/A;  . VARICOSE VEIN SURGERY Bilateral [place] are reviewed and negatives except as per HPI above  Current Outpatient Medications  Medication Sig Dispense Refill  . atorvastatin (LIPITOR) 80 MG tablet TAKE 1 TABLET BY MOUTH EVERY MORNING 30 tablet 9  . beta carotene w/minerals (OCUVITE) tablet Take 1 tablet by mouth 2 (two) times daily.     . Calcium Carb-Cholecalciferol (CALTRATE 600+D) 600-800 MG-UNIT TABS Take 1 tablet by mouth every evening.     . clopidogrel (PLAVIX) 75 MG tablet TAKE 1 TABLET BY MOUTH EVERY DAY WITH BREAKFAST 30 tablet 5  . Coenzyme Q-10 100 MG capsule Take 200 mg by mouth daily.     Marland Kitchen diltiazem (CARDIZEM CD) 120 MG 24 hr capsule TAKE 1 CAPSULE (120 MG TOTAL) BY MOUTH AT BEDTIME. 90 capsule 3  . diltiazem (CARDIZEM CD) 240 MG 24 hr capsule Take 1 capsule (240 mg total) by mouth daily. With breakfast 90 capsule 3  . furosemide (LASIX) 40 MG tablet Take 80 mg by mouth daily.    . isosorbide mononitrate (IMDUR) 60 MG 24 hr tablet TAKE 1 TABLET (60 MG TOTAL) BY MOUTH DAILY. 30 tablet 9  . KLOR-CON M20 20 MEQ tablet TAKE 1 TABLET (20 MEQ TOTAL) BY MOUTH DAILY. 30 tablet  9  . metoprolol tartrate (LOPRESSOR) 25 MG tablet Take 75 mg by mouth every evening.    . Multiple Vitamin (MULTIVITAMIN) capsule Take 1 capsule by mouth daily.    . nitroGLYCERIN (NITROSTAT) 0.4 MG SL tablet Place 1 tablet (0.4 mg total) every 5 (five) minutes as needed under the tongue for chest pain. Max 3 doses. 25 tablet 3  . Omega-3 Fatty Acids (FISH OIL PO) Take 1,200 mg by mouth daily.     Marland Kitchen spironolactone (ALDACTONE) 25 MG tablet Take 25 mg by mouth daily as needed (fluid).  45 tablet 11  . warfarin (COUMADIN) 2 MG tablet Take as directed by Coumadin Clinic 30 tablet 3  . metoprolol tartrate (LOPRESSOR) 100 MG tablet Take 100 mg by mouth every morning. Also takes (3) 25 mg tabs (75 mg) in the evening     No current facility-administered  medications for this visit.     Physical Exam: Vitals:   01/16/17 1225  BP: 128/62  Pulse: 68  SpO2: 96%  Weight: 181 lb 9.6 oz (82.4 kg)  Height: 5' (1.524 m)    GEN- The patient is well appearing, alert and oriented x 3 today.   Head- normocephalic, atraumatic Eyes-  Sclera clear, conjunctiva pink Ears- hearing intact Oropharynx- clear Lungs- Clear to ausculation bilaterally, normal work of breathing Heart- irregular rate and rhythm, no murmurs, rubs or gallops, PMI not laterally displaced GI- soft, NT, ND, + BS Extremities- no clubbing, cyanosis, or edema  EKG tracing ordered today is personally reviewed and shows atypical atrial flutter, V rate 68 bpm, IVCD  Assessment and Plan:  1. Persistent afib/ atypical atrial flutter Doing well currently with minimal symptoms Rate controlled On coumadin  2. Bradycardia Improved No changes Though her Leadless pacemaker is no longer function, she does not currently require that this be replaced  3. HTN Stable No change required today  4. CAD Stable Dr Hassell Done recent notes reviewed  She has slowly progressive malignancy for which she is followed by Dr Denman George.  She is leaning towards a conservative approach.  From my standpoint, I think that she would be a candidate for any treatment option that she chooses.  If she decides to proceed, then our office would assist with perioperative oral anticoagulation recommendations  Return to see me in a year  Thompson Grayer MD, [facility] 01/16/2017 12:45 PM

## 2017-01-16 NOTE — Patient Instructions (Signed)
Medication Instructions:  Your physician recommends that you continue on your current medications as directed. Please refer to the Current Medication list given to you today.   Labwork: None ordered   Testing/Procedures: None ordered   Follow-Up: Your physician wants you to follow-up in: 12 months with Dr Allred You will receive a reminder letter in the mail two months in advance. If you don't receive a letter, please call our office to schedule the follow-up appointment.   Any Other Special Instructions Will Be Listed Below (If Applicable).     If you need a refill on your cardiac medications before your next appointment, please call your pharmacy.   

## 2017-01-18 ENCOUNTER — Other Ambulatory Visit: Payer: Self-pay | Admitting: Interventional Cardiology

## 2017-01-19 ENCOUNTER — Encounter (HOSPITAL_COMMUNITY): Payer: Self-pay

## 2017-01-19 DIAGNOSIS — I5042 Chronic combined systolic (congestive) and diastolic (congestive) heart failure: Secondary | ICD-10-CM | POA: Diagnosis not present

## 2017-01-19 DIAGNOSIS — E7800 Pure hypercholesterolemia, unspecified: Secondary | ICD-10-CM | POA: Diagnosis not present

## 2017-01-19 DIAGNOSIS — I2510 Atherosclerotic heart disease of native coronary artery without angina pectoris: Secondary | ICD-10-CM | POA: Diagnosis not present

## 2017-01-19 DIAGNOSIS — N183 Chronic kidney disease, stage 3 (moderate): Secondary | ICD-10-CM | POA: Diagnosis not present

## 2017-01-19 DIAGNOSIS — C569 Malignant neoplasm of unspecified ovary: Secondary | ICD-10-CM | POA: Diagnosis not present

## 2017-01-19 DIAGNOSIS — I10 Essential (primary) hypertension: Secondary | ICD-10-CM | POA: Diagnosis not present

## 2017-01-20 DIAGNOSIS — F4323 Adjustment disorder with mixed anxiety and depressed mood: Secondary | ICD-10-CM | POA: Diagnosis not present

## 2017-01-23 ENCOUNTER — Encounter (HOSPITAL_COMMUNITY): Payer: Self-pay

## 2017-01-26 DIAGNOSIS — R0989 Other specified symptoms and signs involving the circulatory and respiratory systems: Secondary | ICD-10-CM | POA: Diagnosis not present

## 2017-01-26 DIAGNOSIS — R0689 Other abnormalities of breathing: Secondary | ICD-10-CM | POA: Diagnosis not present

## 2017-01-26 DIAGNOSIS — I499 Cardiac arrhythmia, unspecified: Secondary | ICD-10-CM | POA: Diagnosis not present

## 2017-01-26 DIAGNOSIS — R55 Syncope and collapse: Secondary | ICD-10-CM | POA: Diagnosis not present

## 2017-01-26 DIAGNOSIS — J811 Chronic pulmonary edema: Secondary | ICD-10-CM | POA: Diagnosis not present

## 2017-01-26 DIAGNOSIS — R000 Tachycardia, unspecified: Secondary | ICD-10-CM | POA: Diagnosis not present

## 2017-01-26 DIAGNOSIS — J9811 Atelectasis: Secondary | ICD-10-CM | POA: Diagnosis not present

## 2017-01-26 DIAGNOSIS — R0789 Other chest pain: Secondary | ICD-10-CM | POA: Diagnosis not present

## 2017-01-26 DIAGNOSIS — Z452 Encounter for adjustment and management of vascular access device: Secondary | ICD-10-CM | POA: Diagnosis not present

## 2017-01-26 DIAGNOSIS — G4733 Obstructive sleep apnea (adult) (pediatric): Secondary | ICD-10-CM | POA: Diagnosis not present

## 2017-01-26 DIAGNOSIS — R42 Dizziness and giddiness: Secondary | ICD-10-CM | POA: Diagnosis not present

## 2017-01-26 DIAGNOSIS — I480 Paroxysmal atrial fibrillation: Secondary | ICD-10-CM | POA: Diagnosis not present

## 2017-01-26 DIAGNOSIS — R0602 Shortness of breath: Secondary | ICD-10-CM | POA: Diagnosis not present

## 2017-01-26 DIAGNOSIS — I509 Heart failure, unspecified: Secondary | ICD-10-CM | POA: Diagnosis not present

## 2017-01-26 DIAGNOSIS — I5032 Chronic diastolic (congestive) heart failure: Secondary | ICD-10-CM | POA: Diagnosis not present

## 2017-01-26 DIAGNOSIS — E785 Hyperlipidemia, unspecified: Secondary | ICD-10-CM | POA: Diagnosis not present

## 2017-01-26 DIAGNOSIS — I2510 Atherosclerotic heart disease of native coronary artery without angina pectoris: Secondary | ICD-10-CM | POA: Diagnosis not present

## 2017-01-26 DIAGNOSIS — E8770 Fluid overload, unspecified: Secondary | ICD-10-CM | POA: Diagnosis not present

## 2017-01-26 DIAGNOSIS — R001 Bradycardia, unspecified: Secondary | ICD-10-CM | POA: Diagnosis not present

## 2017-01-26 DIAGNOSIS — I501 Left ventricular failure: Secondary | ICD-10-CM | POA: Diagnosis not present

## 2017-01-26 DIAGNOSIS — I5030 Unspecified diastolic (congestive) heart failure: Secondary | ICD-10-CM | POA: Diagnosis not present

## 2017-01-26 DIAGNOSIS — J9600 Acute respiratory failure, unspecified whether with hypoxia or hypercapnia: Secondary | ICD-10-CM | POA: Diagnosis not present

## 2017-01-26 DIAGNOSIS — I495 Sick sinus syndrome: Secondary | ICD-10-CM | POA: Diagnosis not present

## 2017-01-26 DIAGNOSIS — J9601 Acute respiratory failure with hypoxia: Secondary | ICD-10-CM | POA: Diagnosis not present

## 2017-01-27 DIAGNOSIS — I10 Essential (primary) hypertension: Secondary | ICD-10-CM | POA: Diagnosis not present

## 2017-01-27 DIAGNOSIS — I480 Paroxysmal atrial fibrillation: Secondary | ICD-10-CM | POA: Diagnosis not present

## 2017-01-27 DIAGNOSIS — R42 Dizziness and giddiness: Secondary | ICD-10-CM | POA: Diagnosis present

## 2017-01-27 DIAGNOSIS — Z8589 Personal history of malignant neoplasm of other organs and systems: Secondary | ICD-10-CM | POA: Diagnosis not present

## 2017-01-27 DIAGNOSIS — R5383 Other fatigue: Secondary | ICD-10-CM | POA: Diagnosis present

## 2017-01-27 DIAGNOSIS — R079 Chest pain, unspecified: Secondary | ICD-10-CM | POA: Diagnosis present

## 2017-01-27 DIAGNOSIS — R0602 Shortness of breath: Secondary | ICD-10-CM | POA: Diagnosis present

## 2017-01-27 DIAGNOSIS — R0689 Other abnormalities of breathing: Secondary | ICD-10-CM | POA: Diagnosis not present

## 2017-01-27 DIAGNOSIS — I38 Endocarditis, valve unspecified: Secondary | ICD-10-CM | POA: Diagnosis not present

## 2017-01-27 DIAGNOSIS — J811 Chronic pulmonary edema: Secondary | ICD-10-CM | POA: Diagnosis not present

## 2017-01-27 DIAGNOSIS — E785 Hyperlipidemia, unspecified: Secondary | ICD-10-CM | POA: Diagnosis not present

## 2017-01-27 DIAGNOSIS — R531 Weakness: Secondary | ICD-10-CM | POA: Diagnosis present

## 2017-01-27 DIAGNOSIS — R112 Nausea with vomiting, unspecified: Secondary | ICD-10-CM | POA: Diagnosis present

## 2017-01-27 DIAGNOSIS — J90 Pleural effusion, not elsewhere classified: Secondary | ICD-10-CM | POA: Diagnosis not present

## 2017-01-27 DIAGNOSIS — R6889 Other general symptoms and signs: Secondary | ICD-10-CM | POA: Diagnosis not present

## 2017-01-27 DIAGNOSIS — I110 Hypertensive heart disease with heart failure: Secondary | ICD-10-CM | POA: Diagnosis present

## 2017-01-27 DIAGNOSIS — R5381 Other malaise: Secondary | ICD-10-CM | POA: Diagnosis present

## 2017-01-27 DIAGNOSIS — I4891 Unspecified atrial fibrillation: Secondary | ICD-10-CM | POA: Diagnosis not present

## 2017-01-27 DIAGNOSIS — I501 Left ventricular failure: Secondary | ICD-10-CM | POA: Diagnosis not present

## 2017-01-27 DIAGNOSIS — Z79899 Other long term (current) drug therapy: Secondary | ICD-10-CM | POA: Diagnosis not present

## 2017-01-27 DIAGNOSIS — G4733 Obstructive sleep apnea (adult) (pediatric): Secondary | ICD-10-CM | POA: Diagnosis not present

## 2017-01-27 DIAGNOSIS — R55 Syncope and collapse: Secondary | ICD-10-CM | POA: Diagnosis not present

## 2017-01-27 DIAGNOSIS — I2510 Atherosclerotic heart disease of native coronary artery without angina pectoris: Secondary | ICD-10-CM | POA: Diagnosis not present

## 2017-01-27 DIAGNOSIS — E8770 Fluid overload, unspecified: Secondary | ICD-10-CM | POA: Diagnosis not present

## 2017-01-27 DIAGNOSIS — I495 Sick sinus syndrome: Secondary | ICD-10-CM | POA: Diagnosis not present

## 2017-01-27 DIAGNOSIS — J9601 Acute respiratory failure with hypoxia: Secondary | ICD-10-CM | POA: Diagnosis not present

## 2017-01-27 DIAGNOSIS — Z79891 Long term (current) use of opiate analgesic: Secondary | ICD-10-CM | POA: Diagnosis not present

## 2017-01-27 DIAGNOSIS — I5030 Unspecified diastolic (congestive) heart failure: Secondary | ICD-10-CM | POA: Diagnosis not present

## 2017-01-27 DIAGNOSIS — Z955 Presence of coronary angioplasty implant and graft: Secondary | ICD-10-CM | POA: Diagnosis not present

## 2017-01-27 DIAGNOSIS — R001 Bradycardia, unspecified: Secondary | ICD-10-CM | POA: Diagnosis not present

## 2017-01-27 DIAGNOSIS — I5032 Chronic diastolic (congestive) heart failure: Secondary | ICD-10-CM | POA: Diagnosis present

## 2017-01-27 DIAGNOSIS — J9600 Acute respiratory failure, unspecified whether with hypoxia or hypercapnia: Secondary | ICD-10-CM | POA: Diagnosis not present

## 2017-01-27 DIAGNOSIS — Z7901 Long term (current) use of anticoagulants: Secondary | ICD-10-CM | POA: Diagnosis not present

## 2017-01-27 DIAGNOSIS — Z9981 Dependence on supplemental oxygen: Secondary | ICD-10-CM | POA: Diagnosis not present

## 2017-01-27 DIAGNOSIS — Z950 Presence of cardiac pacemaker: Secondary | ICD-10-CM | POA: Diagnosis not present

## 2017-01-30 ENCOUNTER — Encounter (HOSPITAL_COMMUNITY): Payer: Self-pay

## 2017-02-02 ENCOUNTER — Telehealth: Payer: Self-pay | Admitting: Internal Medicine

## 2017-02-02 ENCOUNTER — Encounter (HOSPITAL_COMMUNITY): Payer: Self-pay

## 2017-02-02 NOTE — Telephone Encounter (Signed)
Patient already has any appointment scheduled with Chanetta Marshall, NP on 02/11/17 at 12:20 for wound check.  Lenna Sciara is going to call patient to ensure she is aware.

## 2017-02-02 NOTE — Telephone Encounter (Signed)
Pt went to [place], [place] last Sunday, c/o dizziness on ride down and woke up in the middle of the night very SOB.  They called 911 and by Monday afternoon pt had a PPM placed.  Pt just got out Friday and returned home Saturday.  States she is feeling better and feels like she is getting stronger.  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  Pt needed to know about f/u.  Pt does have home device to check pacemaker.  Advised I would send message to device and scheduler about appropriate f/u for each dept.  Pt appreciative for call.

## 2017-02-02 NOTE — Telephone Encounter (Signed)
New message      Patient just got back in town, when she was in [facility] she had to go to [facility] and have a pacemaker installed, she is home and she wants to follow up with you on what the next steps are where to proceed.

## 2017-02-03 DIAGNOSIS — C569 Malignant neoplasm of unspecified ovary: Secondary | ICD-10-CM

## 2017-02-03 HISTORY — DX: Malignant neoplasm of unspecified ovary: C56.9

## 2017-02-06 ENCOUNTER — Encounter (HOSPITAL_COMMUNITY): Payer: Self-pay

## 2017-02-06 DIAGNOSIS — Z48812 Encounter for surgical aftercare following surgery on the circulatory system: Secondary | ICD-10-CM | POA: Insufficient documentation

## 2017-02-06 DIAGNOSIS — I214 Non-ST elevation (NSTEMI) myocardial infarction: Secondary | ICD-10-CM | POA: Insufficient documentation

## 2017-02-06 DIAGNOSIS — Z955 Presence of coronary angioplasty implant and graft: Secondary | ICD-10-CM | POA: Insufficient documentation

## 2017-02-09 ENCOUNTER — Encounter (HOSPITAL_COMMUNITY): Payer: Self-pay

## 2017-02-09 NOTE — Progress Notes (Signed)
Electrophysiology Office Note Date: 02/11/2017  ID:  Patty [name], DOB 10/19/1930, MRN [id number]  PCP: [name], MD Primary Cardiologist: Irish Lack Electrophysiologist: Allred  CC: Pacemaker follow-up  Patty [name] is a 84 y.o. female is seen today for Dr [name].  Since last being seen in our clinic, the patient was vacationing in [facility] where she developed recurrent symptomatic bradycardia. She underwent transvenous pacemaker implant at that time. [name] was not explanted. She called the office yesterday concerned about her pacemaker site. She has a small hematoma over medial side of device pocket. No ecchymosis. Tenderness has resolved.  Device interrogation was normal. She presents today for further evaluation. She denies palpitations, dyspnea, PND, orthopnea, nausea, vomiting, dizziness, syncope, edema.  Device History: STJ leadless PPM implanted 2014 for tachy/brady (early battery depletion); STJ dual chamber transvenous pacemaker implanted 2018 for symptomatic bradycardia   Past Medical History:  Diagnosis Date  . Chronic anticoagulation - coumadin, CHADS2VASC=6 05/17/2015  . CKD (chronic kidney disease), stage III ([place])   . Combined systolic and diastolic heart failure ([place])   . Coronary artery disease    a. s/p multiple stents - stenting x 2 to the LAD and x 1 to the LCx in 2000, rotational arthrectomy to proximal LCx and DES to LAD in 2014 and DES to LCx in 2014, along with DES to LAD for re-in-stent stenosis in 2015, and DES to ostial LAD in 2016. b. 07/2016 - orbital atherectomy & DES to mid LCx.  . Diverticulitis   . Granulosa cell carcinoma ([place])    abd; last episode was in 2009  . Hyperlipidemia   . Hypertension   . LBBB (left bundle branch block)       . Myocardial infarction ([place]) 2002  . Obesity   . OSA on CPAP   . Pacemaker failure    a. Prior leadless PPM with premature battery failure, being managed conservatively without replacement.  . Peripheral  vascular disease ([place])   . Permanent atrial fibrillation ([place]) 2013  . Renal artery stenosis ([place])   . Tachycardia-bradycardia syndrome ([place])    a. s/p leadless pacemaker ([name]) implanted by Dr [name]  . Varicose veins    Past Surgical History:  Procedure Laterality Date  . ABDOMINAL HYSTERECTOMY    . CARDIAC CATHETERIZATION  09/03/2007   EF 70%; Failed attempt at PCI to OM  . CARDIAC CATHETERIZATION  11/01/2003   EF 70%  . CARDIAC CATHETERIZATION N/A 12/14/2015   Procedure: Left Heart Cath and Coronary Angiography;  Surgeon: Burnell Blanks, MD;  Location: [place] CV LAB;  Service: Cardiovascular;  Laterality: N/A;  . CARDIOVERSION  12/31/2011   Procedure: CARDIOVERSION;  Surgeon: [name], MD;  Location: [facility] ENDOSCOPY;  Service: Cardiovascular;  Laterality: N/A;  . CARDIOVERSION N/A 12/31/2011   Procedure: CARDIOVERSION;  Surgeon: [name], MD;  Location: [facility] CATH LAB;  Service: Cardiovascular;  Laterality: N/A;  . CATARACT EXTRACTION, BILATERAL  2015  . CHOLECYSTECTOMY  1980's  . COLON SURGERY  2004   colectomy for diverticulosis  . CORONARY ANGIOPLASTY WITH STENT PLACEMENT  2000; 08/11/2012; 11/12/2012   3 + 2 LAD & CFX; 2nd CFX stent 11/12/2012  . CORONARY ATHERECTOMY N/A 07/15/2016   Procedure: Coronary Atherectomy;  Surgeon: Martinique, Peter M, MD;  Location: [place] CV LAB;  Service: Cardiovascular;  Laterality: N/A;  . CORONARY BALLOON ANGIOPLASTY N/A 07/14/2016   Procedure: Coronary Balloon Angioplasty;  Surgeon: Martinique, Peter M, MD;  Location: [place] CV LAB;  Service: Cardiovascular;  Laterality: N/A;  . CORONARY STENT INTERVENTION N/A 07/15/2016   Procedure: Coronary Stent Intervention;  Surgeon: Martinique, Peter M, MD;  Location: [place] CV LAB;  Service: Cardiovascular;  Laterality: N/A;  . FRACTIONAL FLOW RESERVE WIRE  10/07/2013   Procedure: [place];  Surgeon: [name], MD;  Location: [facility] CATH LAB;   Service: Cardiovascular;;  . granulosa tumor excision  2000; 2003; 2004; 2007   "all in my abdomen including small intestines, outside my ?uterus/etc" (08/11/2012)  . HERNIA REPAIR  2005   "laparoscopic"  . LEFT HEART CATH AND CORONARY ANGIOGRAPHY N/A 07/14/2016   Procedure: Left Heart Cath and Coronary Angiography;  Surgeon: Martinique, Peter M, MD;  Location: [place] CV LAB;  Service: Cardiovascular;  Laterality: N/A;  . LEFT HEART CATHETERIZATION WITH CORONARY ANGIOGRAM N/A 11/12/2012   Procedure: LEFT HEART CATHETERIZATION WITH CORONARY ANGIOGRAM;  Surgeon: [name], MD;  Location: [facility] CATH LAB;  Service: Cardiovascular;  Laterality: N/A;  . LEFT HEART CATHETERIZATION WITH CORONARY ANGIOGRAM N/A 10/07/2013   Procedure: LEFT HEART CATHETERIZATION WITH CORONARY ANGIOGRAM;  Surgeon: [name], MD;  Location: [facility] CATH LAB;  Service: Cardiovascular;  Laterality: N/A;  . LEFT HEART CATHETERIZATION WITH CORONARY ANGIOGRAM N/A 12/14/2013   Procedure: LEFT HEART CATHETERIZATION WITH CORONARY ANGIOGRAM;  Surgeon: Sinclair Grooms, MD;  Location: [facility] CATH LAB;  Service: Cardiovascular;  Laterality: N/A;  . LEFT HEART CATHETERIZATION WITH CORONARY ANGIOGRAM N/A 05/16/2014   Procedure: LEFT HEART CATHETERIZATION WITH CORONARY ANGIOGRAM;  Surgeon: Sherren Mocha, MD;  Location: [facility] CATH LAB;  Service: Cardiovascular;  Laterality: N/A;  . PERCUTANEOUS CORONARY INTERVENTION-BALLOON ONLY  08/04/2012   Procedure: PERCUTANEOUS CORONARY INTERVENTION-BALLOON ONLY;  Surgeon: [name], MD;  Location: [facility] CATH LAB;  Service: Cardiovascular;;  . PERCUTANEOUS CORONARY ROTOBLATOR INTERVENTION (PCI-R) N/A 08/11/2012   Procedure: PERCUTANEOUS CORONARY ROTOBLATOR INTERVENTION (PCI-R);  Surgeon: [name], MD;  Location: [facility] CATH LAB;  Service: Cardiovascular;  Laterality: N/A;  . PERCUTANEOUS CORONARY STENT INTERVENTION (PCI-S)  10/07/2013   Procedure: PERCUTANEOUS CORONARY STENT INTERVENTION  (PCI-S);  Surgeon: [name], MD;  Location: [facility] CATH LAB;  Service: Cardiovascular;;  . PERMANENT PACEMAKER INSERTION N/A 03/16/2012   [name] (SJM) leadless pacemaker (LEADLESS II STUDY PATEINT)  . SALIVARY GLAND SURGERY  2000's   "had a little lump removed; granulosa related; it was benign" (08/11/2012)  . TEE WITHOUT CARDIOVERSION  12/31/2011   Procedure: TRANSESOPHAGEAL ECHOCARDIOGRAM (TEE);  Surgeon: [name], MD;  Location: [place];  Service: Cardiovascular;  Laterality: N/A;  . VARICOSE VEIN SURGERY Bilateral 1977    Current Outpatient Medications  Medication Sig Dispense Refill  . atorvastatin (LIPITOR) 80 MG tablet TAKE 1 TABLET BY MOUTH EVERY MORNING 30 tablet 9  . beta carotene w/minerals (OCUVITE) tablet Take 1 tablet by mouth 2 (two) times daily.     . Calcium Carb-Cholecalciferol (CALTRATE 600+D) 600-800 MG-UNIT TABS Take 1 tablet by mouth every evening.     . clopidogrel (PLAVIX) 75 MG tablet TAKE 1 TABLET BY MOUTH EVERY DAY WITH BREAKFAST 90 tablet 3  . Coenzyme Q-10 100 MG capsule Take 200 mg by mouth daily.     Marland Kitchen diltiazem (CARDIZEM CD) 120 MG 24 hr capsule TAKE 1 CAPSULE (120 MG TOTAL) BY MOUTH AT BEDTIME. 90 capsule 3  . diltiazem (CARDIZEM CD) 240 MG 24 hr capsule Take 1 capsule (240 mg total) by mouth daily. With breakfast 90 capsule 3  . furosemide (LASIX) 40  MG tablet Take 80 mg by mouth daily.    . isosorbide mononitrate (IMDUR) 60 MG 24 hr tablet TAKE 1 TABLET (60 MG TOTAL) BY MOUTH DAILY. 30 tablet 9  . KLOR-CON M20 20 MEQ tablet TAKE 1 TABLET (20 MEQ TOTAL) BY MOUTH DAILY. 30 tablet 9  . lisinopril (PRINIVIL,ZESTRIL) 20 MG tablet Take 20 mg by mouth daily.  0  . metoprolol tartrate (LOPRESSOR) 100 MG tablet Take 100 mg by mouth every morning. Also takes (3) 25 mg tabs (75 mg) in the evening    . metoprolol tartrate (LOPRESSOR) 25 MG tablet Take 75 mg by mouth every evening.    . Multiple Vitamin (MULTIVITAMIN) capsule Take 1 capsule by  mouth daily.    . nitroGLYCERIN (NITROSTAT) 0.4 MG SL tablet Place 1 tablet (0.4 mg total) every 5 (five) minutes as needed under the tongue for chest pain. Max 3 doses. 25 tablet 3  . Omega-3 Fatty Acids (FISH OIL PO) Take 1,200 mg by mouth daily.     Marland Kitchen spironolactone (ALDACTONE) 25 MG tablet Take 25 mg by mouth daily as needed (fluid).  45 tablet 11  . warfarin (COUMADIN) 2 MG tablet Take as directed by Coumadin Clinic 30 tablet 3   No current facility-administered medications for this visit.     Allergies:   Bee venom and Other   Social History: Social History   Socioeconomic History  . Marital status: Widowed    Spouse name: Not on file  . Number of children: 4  . Years of education: Not on file  . Highest education level: Not on file  Social Needs  . Financial resource strain: Not on file  . Food insecurity - worry: Not on file  . Food insecurity - inability: Not on file  . Transportation needs - medical: Not on file  . Transportation needs - non-medical: Not on file  Occupational History  . Occupation: Retired Nurse, learning disability estate  Tobacco Use  . Smoking status: Former Smoker    Packs/day: 1.00    Years: 32.00    Pack years: 32.00    Types: Cigarettes    Last attempt to quit: 02/03/1974    Years since quitting: 43.0  . Smokeless tobacco: Never Used  Substance and Sexual Activity  . Alcohol use: Yes    Alcohol/week: 3.0 oz    Types: 5 Glasses of wine per week    Comment: 2017 WINE WITH DINNER   . Drug use: No  . Sexual activity: No  Other Topics Concern  . Not on file  Social History Narrative   Lives with family.    Family History: Family History  Problem Relation Age of Onset  . Stroke Mother   . Heart attack Father   . Diabetes Father   . Hypertension Father   . Heart attack Brother   . Diabetes Brother   . Hypertension Brother   . Kidney failure Brother      Review of Systems: All other systems reviewed and are otherwise negative except as  noted above.   Physical Exam: VS:  BP 134/68   Pulse 61   Ht 5' (1.524 m)   Wt 180 lb (81.6 kg)   SpO2 95%   BMI 35.15 kg/m  , BMI Body mass index is 35.15 kg/m.  GEN- The patient is elderly appearing, alert and oriented x 3 today.   HEENT: normocephalic, atraumatic; sclera clear, conjunctiva pink; hearing intact; oropharynx clear; neck supple Lungs- Clear to ausculation bilaterally,  normal work of breathing.  No wheezes, rales, rhonchi Heart- Regular rate and rhythm GI- soft, non-tender, non-distended, bowel sounds present Extremities- no clubbing, cyanosis, or edema  MS- no significant deformity or atrophy Skin- warm and dry, scattered bruises, left chest with small hematoma over medial side of device, no ecchymosis, incision well approximated Psych- euthymic mood, full affect Neuro- strength and sensation are intact  PPM Interrogation- reviewed in detail today,  See PACEART report  EKG:  EKG is not ordered today.  Recent Labs: 07/10/2016: B Natriuretic Peptide 361.3 07/11/2016: TSH 2.360 07/12/2016: ALT 29; Magnesium 1.9 07/16/2016: Hemoglobin 9.4; Platelets 350; Potassium 3.8; Sodium 137 12/01/2016: BUN 19.6; Creatinine 0.9   Wt Readings from Last 3 Encounters:  02/11/17 180 lb (81.6 kg)  01/16/17 181 lb 9.6 oz (82.4 kg)  01/15/17 179 lb 12.8 oz (81.6 kg)     Other studies Reviewed: Additional studies/ records that were reviewed today include: hospital records, Dr Jackalyn Lombard office notes  Assessment and Plan:  1.  Permanent atrial fibrillation Continue Warfarin for CHADS2VASC score of at least 5 Rates are reasonable, continue current therapy  2.  CAD No recent ischemic symptoms  Continue current therapy   3.  HTN Stable No change required today  4.  Symptomatic bradycardia Normal device function per interrogation yesterday Wound healing well.  I think small hematoma will resolve on its own. Follow up with Dr [name] next week for wound recheck.    Current  medicines are reviewed at length with the patient today.   The patient does not have concerns regarding her medicines.  The following changes were made today: none  Labs/ tests ordered today include: none   Disposition:   Follow up with Dr [name] in 3 months   Signed, Chanetta Marshall, NP 02/11/2017 6:42 PM  [address] [place] [address] [phone] (office) [phone] (fax)

## 2017-02-10 ENCOUNTER — Ambulatory Visit (INDEPENDENT_AMBULATORY_CARE_PROVIDER_SITE_OTHER): Payer: Medicare Other | Admitting: *Deleted

## 2017-02-10 ENCOUNTER — Telehealth: Payer: Self-pay | Admitting: Internal Medicine

## 2017-02-10 DIAGNOSIS — I495 Sick sinus syndrome: Secondary | ICD-10-CM

## 2017-02-10 DIAGNOSIS — I480 Paroxysmal atrial fibrillation: Secondary | ICD-10-CM

## 2017-02-10 LAB — CUP PACEART INCLINIC DEVICE CHECK
Battery Remaining Longevity: 117 mo
Battery Voltage: 3.05 V
Brady Statistic RA Percent Paced: 77 %
Date Time Interrogation Session: 20190108125813
Implantable Lead Implant Date: 20181224
Implantable Lead Location: 753862
Lead Channel Impedance Value: 525 Ohm
Lead Channel Pacing Threshold Amplitude: 0.75 V
Lead Channel Pacing Threshold Pulse Width: 0.4 ms
Lead Channel Pacing Threshold Pulse Width: 0.4 ms
Lead Channel Pacing Threshold Pulse Width: 0.4 ms
Lead Channel Pacing Threshold Pulse Width: 0.4 ms
Lead Channel Sensing Intrinsic Amplitude: 2.3 mV
Lead Channel Sensing Intrinsic Amplitude: 5.9 mV
Lead Channel Setting Pacing Amplitude: 2.5 V
MDC IDC LEAD IMPLANT DT: 20181224
MDC IDC LEAD LOCATION: 753859
MDC IDC MSMT LEADCHNL RA IMPEDANCE VALUE: 800 Ohm
MDC IDC MSMT LEADCHNL RA PACING THRESHOLD AMPLITUDE: 0.75 V
MDC IDC MSMT LEADCHNL RV PACING THRESHOLD AMPLITUDE: 0.75 V
MDC IDC MSMT LEADCHNL RV PACING THRESHOLD AMPLITUDE: 0.75 V
MDC IDC PG IMPLANT DT: 20181224
MDC IDC PG SERIAL: 8968200
MDC IDC SET LEADCHNL RV PACING AMPLITUDE: 3.5 V
MDC IDC SET LEADCHNL RV PACING PULSEWIDTH: 0.4 ms
MDC IDC SET LEADCHNL RV SENSING SENSITIVITY: 2 mV
MDC IDC STAT BRADY RV PERCENT PACED: 17 %

## 2017-02-10 NOTE — Telephone Encounter (Signed)
New Message     Patient had new pacemaker put in 2 weeks ago, the incision area is swollen and hot to the touch and very painful.  Should she go into the hospital or come into the office?

## 2017-02-10 NOTE — Telephone Encounter (Signed)
Patty [name] previously had a leadless [employer] [name] PPM which reached early battery depletion in March 2018- Pt did not want device replacement at that time. She was traveling over Christmas to [place] and had several instances of dizziness- taken to ER via EMS at 4am 01/26/17. EP in [place] communicated with Dr. [name] and the decision was made to place a transvenous PPM. She reports that she has taken her Plavix and warfarin- neither were ever held. This morning while undressing for the shower she noticed a large lump and tenderness at the device site- she reports this was new since yesterday. She is agreeable to come to the office for evaluation today. She will contact her daughter to see if she can be here by noon.  At this time I have advised Patty [name] to keep the appointment with Chanetta Marshall, NP tomorrow- she agrees.  Added to the device clinic schedule for 1200 today.

## 2017-02-10 NOTE — Progress Notes (Signed)
Wound check appointment (added on for swelling at device site). Small amounts of dermabond in place. Wound without redness, drainage or bruising. Moderate swelling below incision, no firm borders, device palpable. Incision edges approximated, wound well healed. Photos taken with patient permission. Discussed with Chanetta Marshall, NP. She agrees to wait until tomorrow for further intervention. Pressure dressing offered to the patient for support- she declined. Normal device function. Thresholds, sensing, and impedances consistent with implant measurements. Histogram distribution appropriate for patient and level of activity. 5.4% AT/AF- known PAF, on warfarin. No high ventricular rates noted. Patient educated about wound care, arm mobility, lifting restrictions. ROV tomorrow (02/11/17) with Chanetta Marshall, NP and INR check with [facility].

## 2017-02-10 NOTE — Telephone Encounter (Signed)
Patient called and stated that while she was on vacation she had a generator change on Christmas Eve (01-26-2017). She stated that the incision site is significantly swollen, warm to the touch, and it is painful.

## 2017-02-11 ENCOUNTER — Encounter: Payer: Self-pay | Admitting: Nurse Practitioner

## 2017-02-11 ENCOUNTER — Ambulatory Visit (INDEPENDENT_AMBULATORY_CARE_PROVIDER_SITE_OTHER): Payer: Medicare Other | Admitting: Nurse Practitioner

## 2017-02-11 ENCOUNTER — Ambulatory Visit (INDEPENDENT_AMBULATORY_CARE_PROVIDER_SITE_OTHER): Payer: Medicare Other | Admitting: *Deleted

## 2017-02-11 VITALS — BP 134/68 | HR 61 | Ht 60.0 in | Wt 180.0 lb

## 2017-02-11 DIAGNOSIS — R001 Bradycardia, unspecified: Secondary | ICD-10-CM

## 2017-02-11 DIAGNOSIS — I4891 Unspecified atrial fibrillation: Secondary | ICD-10-CM

## 2017-02-11 DIAGNOSIS — Z5181 Encounter for therapeutic drug level monitoring: Secondary | ICD-10-CM | POA: Diagnosis not present

## 2017-02-11 DIAGNOSIS — I2510 Atherosclerotic heart disease of native coronary artery without angina pectoris: Secondary | ICD-10-CM

## 2017-02-11 DIAGNOSIS — I482 Chronic atrial fibrillation: Secondary | ICD-10-CM | POA: Diagnosis not present

## 2017-02-11 DIAGNOSIS — F4323 Adjustment disorder with mixed anxiety and depressed mood: Secondary | ICD-10-CM | POA: Diagnosis not present

## 2017-02-11 DIAGNOSIS — I4821 Permanent atrial fibrillation: Secondary | ICD-10-CM

## 2017-02-11 DIAGNOSIS — I10 Essential (primary) hypertension: Secondary | ICD-10-CM

## 2017-02-11 LAB — POCT INR: INR: 2.1

## 2017-02-11 NOTE — Patient Instructions (Addendum)
Medication Instructions:   Your physician recommends that you continue on your current medications as directed. Please refer to the Current Medication list given to you today.   If you need a refill on your cardiac medications before your next appointment, please call your pharmacy.  Labwork: NONE ORDERED  TODAY    Testing/Procedures: NONE ORDERED  TODAY   Follow-Up: WITH DR [name] NEXT Friday     Any Other Special Instructions Will Be Listed Below (If Applicable).

## 2017-02-11 NOTE — Patient Instructions (Signed)
Description   Continue on same dose of 2 mg daily except 1 mg on Mondays and Fridays. Keep dark greens consistent. Recheck INR in 4 weeks. Main [phone]. Coumadin Clinic # [phone]

## 2017-02-12 DIAGNOSIS — Z950 Presence of cardiac pacemaker: Secondary | ICD-10-CM | POA: Diagnosis not present

## 2017-02-12 DIAGNOSIS — I2510 Atherosclerotic heart disease of native coronary artery without angina pectoris: Secondary | ICD-10-CM | POA: Diagnosis not present

## 2017-02-12 DIAGNOSIS — N183 Chronic kidney disease, stage 3 (moderate): Secondary | ICD-10-CM | POA: Diagnosis not present

## 2017-02-12 DIAGNOSIS — I5042 Chronic combined systolic (congestive) and diastolic (congestive) heart failure: Secondary | ICD-10-CM | POA: Diagnosis not present

## 2017-02-12 DIAGNOSIS — E7800 Pure hypercholesterolemia, unspecified: Secondary | ICD-10-CM | POA: Diagnosis not present

## 2017-02-12 DIAGNOSIS — R001 Bradycardia, unspecified: Secondary | ICD-10-CM | POA: Diagnosis not present

## 2017-02-13 ENCOUNTER — Encounter (HOSPITAL_COMMUNITY): Payer: Self-pay

## 2017-02-16 ENCOUNTER — Telehealth: Payer: Self-pay | Admitting: Nurse Practitioner

## 2017-02-16 ENCOUNTER — Telehealth (HOSPITAL_COMMUNITY): Payer: Self-pay | Admitting: *Deleted

## 2017-02-16 ENCOUNTER — Encounter (HOSPITAL_COMMUNITY)
Admission: RE | Admit: 2017-02-16 | Discharge: 2017-02-16 | Disposition: A | Discharge status: Home / Self Care | Payer: Self-pay | Source: Ambulatory Visit | Attending: Interventional Cardiology | Admitting: Interventional Cardiology

## 2017-02-16 NOTE — Telephone Encounter (Signed)
-----   Message from Thompson Grayer, MD sent at 02/13/2017  8:44 PM EST ----- Regarding: RE: Clearance to return to exercise [facility] to resume cardiac rehab now. No heavy lifting (> 5 lbs) with left arm for 6 weeks post implant. ----- Message ----- From: Sol Passer Sent: 02/13/2017   7:13 AM To: Thompson Grayer, MD Subject: Clearance to return to exercise                Greetings Dr. [name],  Your patient Patty [name] [birth year], exercises in the Cardiac Rehab Maintenance Program at [facility]. Patient was admitted on 01/25/17 s/p Pacemaker implantation. Please indicate when patient is cleared to resume exercise in the program and any restrictions she may have.   Thank you, [name]

## 2017-02-16 NOTE — Telephone Encounter (Signed)
New Message     Needs a new order for her to resume cardiac rehab.  They need something in email saying that the patient can resume her rehab

## 2017-02-18 NOTE — Telephone Encounter (Signed)
Made patient aware that Dr. [name] has already communicated with Cardiac Rehab letting them know that it is ok for her to resume cardiac rehab, with no heavy lifting (> 5 lbs) with left arm for 6 weeks post implant (see other phone call on 1/14). Patient verbalized understanding and thanked me for the call.

## 2017-02-20 ENCOUNTER — Ambulatory Visit (INDEPENDENT_AMBULATORY_CARE_PROVIDER_SITE_OTHER): Payer: Medicare Other | Admitting: Internal Medicine

## 2017-02-20 ENCOUNTER — Encounter (HOSPITAL_COMMUNITY)
Admission: RE | Admit: 2017-02-20 | Discharge: 2017-02-20 | Disposition: A | Discharge status: Home / Self Care | Payer: Self-pay | Source: Ambulatory Visit | Attending: Interventional Cardiology | Admitting: Interventional Cardiology

## 2017-02-20 ENCOUNTER — Encounter: Payer: Self-pay | Admitting: Internal Medicine

## 2017-02-20 VITALS — BP 136/64 | HR 60 | Ht 60.0 in | Wt 177.0 lb

## 2017-02-20 DIAGNOSIS — Z950 Presence of cardiac pacemaker: Secondary | ICD-10-CM | POA: Diagnosis not present

## 2017-02-20 DIAGNOSIS — R0602 Shortness of breath: Secondary | ICD-10-CM | POA: Diagnosis not present

## 2017-02-20 DIAGNOSIS — R001 Bradycardia, unspecified: Secondary | ICD-10-CM

## 2017-02-20 DIAGNOSIS — I2510 Atherosclerotic heart disease of native coronary artery without angina pectoris: Secondary | ICD-10-CM

## 2017-02-20 NOTE — Patient Instructions (Signed)
Medication Instructions:  Your physician recommends that you continue on your current medications as directed. Please refer to the Current Medication list given to you today.   Labwork: None ordered   Testing/Procedures: A chest x-ray takes a picture of the organs and structures inside the chest, including the heart, lungs, and blood vessels. This test can show several things, including, whether the heart is enlarges; whether fluid is building up in the lungs; and whether pacemaker / defibrillator leads are still in place.   Follow-Up: Your physician recommends that you schedule a follow-up appointment with Dr [name] in early April   Any Other Special Instructions Will Be Listed Below (If Applicable).     If you need a refill on your cardiac medications before your next appointment, please call your pharmacy.

## 2017-02-20 NOTE — Progress Notes (Signed)
PCP: [name], MD Primary Cardiologist:  Dr Beau Fanny Primary EP:  Dr Mosetta Anis Patty [name] is a 84 y.o. female who presents today for routine electrophysiology followup.  Since her recent visit with EP NP, the patient reports doing very well.  Today, she denies symptoms of palpitations, chest pain, shortness of breath,  lower extremity edema, dizziness, presyncope, or syncope.  The patient is otherwise without complaint today.   Past Medical History:  Diagnosis Date  . Chronic anticoagulation - coumadin, CHADS2VASC=6 05/17/2015  . CKD (chronic kidney disease), stage III ([place])   . Combined systolic and diastolic heart failure ([place])   . Coronary artery disease    a. s/p multiple stents - stenting x 2 to the LAD and x 1 to the LCx in 2000, rotational arthrectomy to proximal LCx and DES to LAD in 2014 and DES to LCx in 2014, along with DES to LAD for re-in-stent stenosis in 2015, and DES to ostial LAD in 2016. b. 07/2016 - orbital atherectomy & DES to mid LCx.  . Diverticulitis   . Granulosa cell carcinoma ([place])    abd; last episode was in 2009  . Hyperlipidemia   . Hypertension   . LBBB (left bundle branch block)       . Myocardial infarction ([place]) 2002  . Obesity   . OSA on CPAP   . Pacemaker failure    a. Prior leadless PPM with premature battery failure, being managed conservatively without replacement.  . Peripheral vascular disease ([place])   . Permanent atrial fibrillation ([place]) 2013  . Renal artery stenosis ([place])   . Tachycardia-bradycardia syndrome ([place])    a. s/p leadless pacemaker ([name]) implanted by Dr [name]  . Varicose veins    Past Surgical History:  Procedure Laterality Date  . ABDOMINAL HYSTERECTOMY    . CARDIAC CATHETERIZATION  09/03/2007   EF 70%; Failed attempt at PCI to OM  . CARDIAC CATHETERIZATION  11/01/2003   EF 70%  . CARDIAC CATHETERIZATION N/A 12/14/2015   Procedure: Left Heart Cath and Coronary Angiography;  Surgeon: Burnell Blanks,  MD;  Location: [place] [facility];  Service: Cardiovascular;  Laterality: N/A;  . CARDIOVERSION  12/31/2011   Procedure: CARDIOVERSION;  Surgeon: [name], MD;  Location: [facility] ENDOSCOPY;  Service: Cardiovascular;  Laterality: N/A;  . CARDIOVERSION N/A 12/31/2011   Procedure: CARDIOVERSION;  Surgeon: [name], MD;  Location: [facility] CATH LAB;  Service: Cardiovascular;  Laterality: N/A;  . CATARACT EXTRACTION, BILATERAL  2015  . CHOLECYSTECTOMY  1980's  . COLON SURGERY  2004   colectomy for diverticulosis  . CORONARY ANGIOPLASTY WITH STENT PLACEMENT  2000; 08/11/2012; 11/12/2012   3 + 2 LAD & CFX; 2nd CFX stent 11/12/2012  . CORONARY ATHERECTOMY N/A 07/15/2016   Procedure: Coronary Atherectomy;  Surgeon: Martinique, Peter M, MD;  Location: [place] [facility];  Service: Cardiovascular;  Laterality: N/A;  . CORONARY BALLOON ANGIOPLASTY N/A 07/14/2016   Procedure: Coronary Balloon Angioplasty;  Surgeon: Martinique, Peter M, MD;  Location: [place] [facility];  Service: Cardiovascular;  Laterality: N/A;  . CORONARY STENT INTERVENTION N/A 07/15/2016   Procedure: Coronary Stent Intervention;  Surgeon: Martinique, Peter M, MD;  Location: [place] [facility];  Service: Cardiovascular;  Laterality: N/A;  . FRACTIONAL FLOW RESERVE WIRE  10/07/2013   Procedure: [place];  Surgeon: [name], MD;  Location: [name] CATH LAB;  Service: Cardiovascular;;  . granulosa tumor excision  2000; 2003; 2004;  2007   "all in my abdomen including small intestines, outside my ?uterus/etc" (08/11/2012)  . HERNIA REPAIR  2005   "laparoscopic"  . LEFT HEART CATH AND CORONARY ANGIOGRAPHY N/A 07/14/2016   Procedure: Left Heart Cath and Coronary Angiography;  Surgeon: Martinique, Peter M, MD;  Location: [place] [facility];  Service: Cardiovascular;  Laterality: N/A;  . LEFT HEART CATHETERIZATION WITH CORONARY ANGIOGRAM N/A 11/12/2012   Procedure: LEFT HEART CATHETERIZATION WITH CORONARY ANGIOGRAM;  Surgeon:  [name], MD;  Location: [facility] CATH LAB;  Service: Cardiovascular;  Laterality: N/A;  . LEFT HEART CATHETERIZATION WITH CORONARY ANGIOGRAM N/A 10/07/2013   Procedure: LEFT HEART CATHETERIZATION WITH CORONARY ANGIOGRAM;  Surgeon: [name], MD;  Location: [facility] CATH LAB;  Service: Cardiovascular;  Laterality: N/A;  . LEFT HEART CATHETERIZATION WITH CORONARY ANGIOGRAM N/A 12/14/2013   Procedure: LEFT HEART CATHETERIZATION WITH CORONARY ANGIOGRAM;  Surgeon: Sinclair Grooms, MD;  Location: [facility] CATH LAB;  Service: Cardiovascular;  Laterality: N/A;  . LEFT HEART CATHETERIZATION WITH CORONARY ANGIOGRAM N/A 05/16/2014   Procedure: LEFT HEART CATHETERIZATION WITH CORONARY ANGIOGRAM;  Surgeon: Sherren Mocha, MD;  Location: [facility] CATH LAB;  Service: Cardiovascular;  Laterality: N/A;  . PERCUTANEOUS CORONARY INTERVENTION-BALLOON ONLY  08/04/2012   Procedure: PERCUTANEOUS CORONARY INTERVENTION-BALLOON ONLY;  Surgeon: [name], MD;  Location: [facility] CATH LAB;  Service: Cardiovascular;;  . PERCUTANEOUS CORONARY ROTOBLATOR INTERVENTION (PCI-R) N/A 08/11/2012   Procedure: PERCUTANEOUS CORONARY ROTOBLATOR INTERVENTION (PCI-R);  Surgeon: [name], MD;  Location: [facility] CATH LAB;  Service: Cardiovascular;  Laterality: N/A;  . PERCUTANEOUS CORONARY STENT INTERVENTION (PCI-S)  10/07/2013   Procedure: PERCUTANEOUS CORONARY STENT INTERVENTION (PCI-S);  Surgeon: [name], MD;  Location: [facility] CATH LAB;  Service: Cardiovascular;;  . PERMANENT PACEMAKER INSERTION N/A 03/16/2012   [name] (SJM) leadless pacemaker (LEADLESS II STUDY PATEINT)  . SALIVARY GLAND SURGERY  2000's   "had a little lump removed; granulosa related; it was benign" (08/11/2012)  . TEE WITHOUT CARDIOVERSION  12/31/2011   Procedure: TRANSESOPHAGEAL ECHOCARDIOGRAM (TEE);  Surgeon: [name], MD;  Location: [place];  Service: Cardiovascular;  Laterality: N/A;  . VARICOSE VEIN SURGERY Bilateral Bowman are reviewed and negative except as per HPI above  Current Outpatient Medications  Medication Sig Dispense Refill  . atorvastatin (LIPITOR) 80 MG tablet TAKE 1 TABLET BY MOUTH EVERY MORNING 30 tablet 9  . beta carotene w/minerals (OCUVITE) tablet Take 1 tablet by mouth 2 (two) times daily.     . Calcium Carb-Cholecalciferol (CALTRATE 600+D) 600-800 MG-UNIT TABS Take 1 tablet by mouth every evening.     . clopidogrel (PLAVIX) 75 MG tablet TAKE 1 TABLET BY MOUTH EVERY DAY WITH BREAKFAST 90 tablet 3  . Coenzyme Q-10 100 MG capsule Take 200 mg by mouth daily.     Marland Kitchen diltiazem (CARDIZEM CD) 120 MG 24 hr capsule TAKE 1 CAPSULE (120 MG TOTAL) BY MOUTH AT BEDTIME. 90 capsule 3  . diltiazem (CARDIZEM CD) 240 MG 24 hr capsule Take 1 capsule (240 mg total) by mouth daily. With breakfast 90 capsule 3  . furosemide (LASIX) 40 MG tablet Take 80 mg by mouth daily.    . isosorbide mononitrate (IMDUR) 60 MG 24 hr tablet TAKE 1 TABLET (60 MG TOTAL) BY MOUTH DAILY. 30 tablet 9  . KLOR-CON M20 20 MEQ tablet TAKE 1 TABLET (20 MEQ TOTAL) BY MOUTH DAILY. 30 tablet 9  . lisinopril (PRINIVIL,ZESTRIL) 20 MG tablet Take 20 mg  by mouth daily. For 30 days    . metoprolol tartrate (LOPRESSOR) 100 MG tablet Take 100 mg by mouth every morning. Also takes (3) 25 mg tabs (75 mg) in the evening    . metoprolol tartrate (LOPRESSOR) 25 MG tablet Take 75 mg by mouth every evening.    . Multiple Vitamin (MULTIVITAMIN) capsule Take 1 capsule by mouth daily.    . nitroGLYCERIN (NITROSTAT) 0.4 MG SL tablet Place 1 tablet (0.4 mg total) every 5 (five) minutes as needed under the tongue for chest pain. Max 3 doses. 25 tablet 3  . Omega-3 Fatty Acids (FISH OIL PO) Take 1,200 mg by mouth daily.     Marland Kitchen spironolactone (ALDACTONE) 25 MG tablet Take 25 mg by mouth daily as needed (fluid).  45 tablet 11  . warfarin (COUMADIN) 2 MG tablet Take as directed by [facility] 30 tablet 3   No current facility-administered medications for  this visit.     Physical Exam: Vitals:   02/20/17 1517  BP: 136/64  Pulse: 60  SpO2: 95%  Weight: 177 lb (80.3 kg)  Height: 5' (1.524 m)    GEN- The patient is well appearing, alert and oriented x 3 today.   Head- normocephalic, atraumatic Eyes-  Sclera clear, conjunctiva pink Ears- hearing intact Oropharynx- clear Lungs- Clear to ausculation bilaterally, normal work of breathing Chest- pacemaker pocket is well healed Heart- Regular rate and rhythm, no murmurs, rubs or gallops, PMI not laterally displaced GI- soft, NT, ND, + BS Extremities- no clubbing, cyanosis, or edema  Pacemaker interrogation- reviewed in detail today,  See PACEART report  Assessment and Plan:  1. Symptomatic sinus bradycardia  See Pace Art report S/p PPM in [place].  Device pocket is healing nicely No changes today  2. Persistent afib Previously permanent.  Interestingly, when recently in [facility], she has conversion to sinus with symptomatic bradycardia. She had SVT, atypical atrial flutter, and AF also observed. Continue warfarin for chads2vasc score of 5.  3. CAD No ischemic symptoms  4. HTN Stable No change required today  Merlin Return to see me in April for 91 day check.  Thompson Grayer MD, [facility] 02/20/2017 3:33 PM

## 2017-02-23 ENCOUNTER — Encounter (HOSPITAL_COMMUNITY)
Admission: RE | Admit: 2017-02-23 | Discharge: 2017-02-23 | Disposition: A | Discharge status: Home / Self Care | Payer: Medicare Other | Source: Ambulatory Visit | Attending: Interventional Cardiology | Admitting: Interventional Cardiology

## 2017-02-24 ENCOUNTER — Telehealth: Payer: Self-pay | Admitting: Gynecologic Oncology

## 2017-02-24 DIAGNOSIS — L03116 Cellulitis of left lower limb: Secondary | ICD-10-CM | POA: Diagnosis not present

## 2017-02-24 NOTE — Telephone Encounter (Signed)
Returned call to patient. Left message.

## 2017-02-27 ENCOUNTER — Encounter (HOSPITAL_COMMUNITY)
Admission: RE | Admit: 2017-02-27 | Discharge: 2017-02-27 | Disposition: A | Discharge status: Home / Self Care | Payer: Medicare Other | Source: Ambulatory Visit | Attending: Interventional Cardiology | Admitting: Interventional Cardiology

## 2017-03-02 ENCOUNTER — Encounter (HOSPITAL_COMMUNITY)
Admission: RE | Admit: 2017-03-02 | Discharge: 2017-03-02 | Disposition: A | Discharge status: Home / Self Care | Payer: Self-pay | Source: Ambulatory Visit | Attending: Interventional Cardiology | Admitting: Interventional Cardiology

## 2017-03-03 LAB — CUP PACEART INCLINIC DEVICE CHECK
Battery Remaining Longevity: 90 mo
Battery Voltage: 3.04 V
Brady Statistic RV Percent Paced: 31 %
Date Time Interrogation Session: 20190118212445
Implantable Lead Implant Date: 20181224
Implantable Lead Location: 753859
Implantable Pulse Generator Implant Date: 20181224
Lead Channel Pacing Threshold Amplitude: 0.75 V
Lead Channel Pacing Threshold Pulse Width: 0.4 ms
Lead Channel Setting Pacing Amplitude: 3.5 V
Lead Channel Setting Pacing Amplitude: 3.5 V
Lead Channel Setting Pacing Pulse Width: 0.4 ms
Lead Channel Setting Sensing Sensitivity: 2 mV
MDC IDC LEAD IMPLANT DT: 20181224
MDC IDC LEAD LOCATION: 753860
MDC IDC MSMT LEADCHNL RA IMPEDANCE VALUE: 750 Ohm
MDC IDC MSMT LEADCHNL RA PACING THRESHOLD AMPLITUDE: 0.75 V
MDC IDC MSMT LEADCHNL RA PACING THRESHOLD PULSEWIDTH: 0.4 ms
MDC IDC MSMT LEADCHNL RV IMPEDANCE VALUE: 537.5 Ohm
MDC IDC MSMT LEADCHNL RV SENSING INTR AMPL: 6 mV
MDC IDC PG SERIAL: 8968200
MDC IDC STAT BRADY RA PERCENT PACED: 88 %
Pulse Gen Model: 2272

## 2017-03-04 ENCOUNTER — Telehealth: Payer: Self-pay | Admitting: *Deleted

## 2017-03-04 NOTE — Telephone Encounter (Signed)
Returned the patient's call and scheduled the patient to see Dr. Denman George to discuss treatment options

## 2017-03-06 ENCOUNTER — Encounter (HOSPITAL_COMMUNITY)
Admission: RE | Admit: 2017-03-06 | Discharge: 2017-03-06 | Disposition: A | Discharge status: Home / Self Care | Payer: Self-pay | Source: Ambulatory Visit | Attending: Interventional Cardiology | Admitting: Interventional Cardiology

## 2017-03-06 DIAGNOSIS — Z955 Presence of coronary angioplasty implant and graft: Secondary | ICD-10-CM | POA: Insufficient documentation

## 2017-03-06 DIAGNOSIS — I214 Non-ST elevation (NSTEMI) myocardial infarction: Secondary | ICD-10-CM | POA: Insufficient documentation

## 2017-03-06 DIAGNOSIS — Z48812 Encounter for surgical aftercare following surgery on the circulatory system: Secondary | ICD-10-CM | POA: Insufficient documentation

## 2017-03-09 ENCOUNTER — Telehealth: Payer: Self-pay | Admitting: *Deleted

## 2017-03-09 ENCOUNTER — Ambulatory Visit (INDEPENDENT_AMBULATORY_CARE_PROVIDER_SITE_OTHER): Payer: Medicare Other | Admitting: Pharmacist

## 2017-03-09 ENCOUNTER — Encounter (HOSPITAL_COMMUNITY)
Admission: RE | Admit: 2017-03-09 | Discharge: 2017-03-09 | Disposition: A | Discharge status: Home / Self Care | Payer: Self-pay | Source: Ambulatory Visit | Attending: Interventional Cardiology | Admitting: Interventional Cardiology

## 2017-03-09 DIAGNOSIS — Z5181 Encounter for therapeutic drug level monitoring: Secondary | ICD-10-CM | POA: Diagnosis not present

## 2017-03-09 DIAGNOSIS — I4891 Unspecified atrial fibrillation: Secondary | ICD-10-CM

## 2017-03-09 LAB — POCT INR: INR: 2.5

## 2017-03-09 NOTE — Telephone Encounter (Signed)
Called and moved the patient's appt from 9:30am to 10:30am on Wednesday.

## 2017-03-09 NOTE — Patient Instructions (Signed)
Description   Continue on same dose of 2 mg daily except 1 mg on Mondays and Fridays. Keep dark greens consistent. Recheck INR in 6 weeks. Main #[phone]. [facility] # [phone]     

## 2017-03-11 ENCOUNTER — Encounter: Payer: Self-pay | Admitting: Gynecologic Oncology

## 2017-03-11 ENCOUNTER — Inpatient Hospital Stay: Payer: Medicare Other | Attending: Gynecologic Oncology | Admitting: Gynecologic Oncology

## 2017-03-11 VITALS — BP 158/74 | HR 61 | Temp 97.0°F | Resp 18 | Wt 177.0 lb

## 2017-03-11 DIAGNOSIS — Z951 Presence of aortocoronary bypass graft: Secondary | ICD-10-CM | POA: Diagnosis not present

## 2017-03-11 DIAGNOSIS — I2510 Atherosclerotic heart disease of native coronary artery without angina pectoris: Secondary | ICD-10-CM | POA: Diagnosis not present

## 2017-03-11 DIAGNOSIS — I482 Chronic atrial fibrillation: Secondary | ICD-10-CM

## 2017-03-11 DIAGNOSIS — Z90722 Acquired absence of ovaries, bilateral: Secondary | ICD-10-CM | POA: Diagnosis not present

## 2017-03-11 DIAGNOSIS — N183 Chronic kidney disease, stage 3 (moderate): Secondary | ICD-10-CM | POA: Diagnosis not present

## 2017-03-11 DIAGNOSIS — I252 Old myocardial infarction: Secondary | ICD-10-CM

## 2017-03-11 DIAGNOSIS — I129 Hypertensive chronic kidney disease with stage 1 through stage 4 chronic kidney disease, or unspecified chronic kidney disease: Secondary | ICD-10-CM | POA: Diagnosis not present

## 2017-03-11 DIAGNOSIS — Z90710 Acquired absence of both cervix and uterus: Secondary | ICD-10-CM

## 2017-03-11 DIAGNOSIS — C569 Malignant neoplasm of unspecified ovary: Secondary | ICD-10-CM | POA: Diagnosis not present

## 2017-03-11 DIAGNOSIS — E1122 Type 2 diabetes mellitus with diabetic chronic kidney disease: Secondary | ICD-10-CM

## 2017-03-11 DIAGNOSIS — Z79899 Other long term (current) drug therapy: Secondary | ICD-10-CM

## 2017-03-11 DIAGNOSIS — E785 Hyperlipidemia, unspecified: Secondary | ICD-10-CM | POA: Diagnosis not present

## 2017-03-11 DIAGNOSIS — Z87891 Personal history of nicotine dependence: Secondary | ICD-10-CM | POA: Diagnosis not present

## 2017-03-11 DIAGNOSIS — I739 Peripheral vascular disease, unspecified: Secondary | ICD-10-CM | POA: Diagnosis not present

## 2017-03-11 DIAGNOSIS — Z7901 Long term (current) use of anticoagulants: Secondary | ICD-10-CM | POA: Diagnosis not present

## 2017-03-11 DIAGNOSIS — I5042 Chronic combined systolic (congestive) and diastolic (congestive) heart failure: Secondary | ICD-10-CM

## 2017-03-11 DIAGNOSIS — R1904 Left lower quadrant abdominal swelling, mass and lump: Secondary | ICD-10-CM

## 2017-03-11 DIAGNOSIS — I495 Sick sinus syndrome: Secondary | ICD-10-CM | POA: Diagnosis not present

## 2017-03-11 NOTE — Patient Instructions (Addendum)
We will call you with the results of the CT Scan of the abdomen and pelvis when available. You will follow up with Dr. Denman George in May as scheduled.  You will be called with an appointment for lab and another CT scan prior to that visit.  Call Dr. Serita Grit office at [phone] if you have any questions or concerns prior to that visit.

## 2017-03-11 NOTE — Progress Notes (Signed)
Consult Note: Gyn-Onc  Consult was requested by Dr. Alvy Bimler for the evaluation of Patty [name] 84 y.o. female  CC:  Chief Complaint  Patient presents with  . Malignant granulosa cell tumor of ovary, unspecified lateral    Assessment/Plan:  [name]  is a 84 y.o.  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  She is considering her options still. She will have a repeat CT now that it has been 3 months, to monitor the status of the recurrent tumor masses. I will see her again in 3 months (with a CT immediately pre-appointment) to re-evaluate and plan for moving forward.   HPI: Patty [name] is an 84 year old woman who is seen in consultation at the request of Dr Alvy Bimler for granulosa cell ovarian cancer.   The patient's history began in approximately 2000 when she underwent ex lap, TAH, BSO with Dr Raylene Miyamoto at [facility]. She did not receive adjuvant chemotherapy. Postoperatively she had recurrences in the peritoneum in 2004, 2007 and 2011.  The 2004 surgery was with Dr Excell Seltzer and included resection of the recurrent tumor with small bowel resection and colectomy for diverticulosis. She has had a laparoscopic hernia repair with mesh in 2005. In 2007 she underwent resection of pelvic tumor recurrence with Dr Excell Seltzer and ventral hernia repair with mesh.  In 2011 Dr Excell Seltzer performed a laparoscopic resection of suprapubic tumor. Her last CT was in 2010.   She has significant CAD with 6 stents and a history of CHF. She has a pacemaker which she states has a nonfunctioning battery and she is not currently paced. This was placed for tachycadia-bradycardia syndrome. She is obese with OSA. She has atrial fibrillation and is on coumadin.   She had been noting intermittent right lower quadrant pains for 2 weeks. They were not associated with bowel movements or other activity. There are no alleviating factors.   Labs:  CA 125 12/01/16: 35 Inhibin B  11/21/16: 119  CT abd/pelvis 12/09/16: A small suprapubic hernia is seen along the inferior margin of the surgical mass containing a loop of small bowel. No evidence of bowel obstruction or ischemia. Multiple new soft tissue masses are seen in the left suprapubic region and left lower quadrant, consistent with recurrent tumor. Largest in the left suprapubic region measures 3.8 x 2.8 cm, and 3.8 x 3.4 cm in left lower quadrant. There is also a new mass in the central small bowel mesenteric measuring 5.8 x 5.0 cm. These findings are consistent with recurrent carcinoma and peritoneal metastases.  She was provided with 3 options for proceeding with treatment of her recurrence:  1/ surgical resection and hernia repair. This will involve exploratory laparotomy, radical tumor debulking, possible bowel resection, possible partial cystectomy, and hernia repair with Dr Excell Seltzer and myself.  She will require medical clearance as she has significant cardiac issues.  2/ chemotherapy with carboplatin and paclitaxel. I discussed that this may not achieve as long a remission as surgical intervention, but would involve a large radical procedure.   3/ A third alternative is hormonal therapy with progestin and tamoxifen in rotation.  She was not sure about which route to pursue, and given that she was asymptomatic with this slow growing tumor, took some time to reflect. In December, 2018 she received replacement of her pacemaker.  Interval Hx: She has met with her cardiologists, Dr [name] and Dr Irish Lack, who feel that she is a candidate for any of the approaches,  however, would prefer until she was 1 year out from her last coronary catheterization procedure to be taken off anticoagulants.  She remains asymptomatic.     Current Meds:  Outpatient Encounter Medications as of 03/11/2017  Medication Sig  . atorvastatin (LIPITOR) 80 MG tablet TAKE 1 TABLET BY MOUTH EVERY MORNING  . beta carotene w/minerals (OCUVITE)  tablet Take 1 tablet by mouth 2 (two) times daily.   . Calcium Carb-Cholecalciferol (CALTRATE 600+D) 600-800 MG-UNIT TABS Take 1 tablet by mouth every evening.   . clopidogrel (PLAVIX) 75 MG tablet TAKE 1 TABLET BY MOUTH EVERY DAY WITH BREAKFAST  . Coenzyme Q-10 100 MG capsule Take 200 mg by mouth daily.   Marland Kitchen diltiazem (CARDIZEM CD) 120 MG 24 hr capsule TAKE 1 CAPSULE (120 MG TOTAL) BY MOUTH AT BEDTIME.  Marland Kitchen diltiazem (CARDIZEM CD) 240 MG 24 hr capsule Take 1 capsule (240 mg total) by mouth daily. With breakfast  . furosemide (LASIX) 40 MG tablet Take 80 mg by mouth daily.  . isosorbide mononitrate (IMDUR) 60 MG 24 hr tablet TAKE 1 TABLET (60 MG TOTAL) BY MOUTH DAILY.  Marland Kitchen KLOR-CON M20 20 MEQ tablet TAKE 1 TABLET (20 MEQ TOTAL) BY MOUTH DAILY.  . metoprolol tartrate (LOPRESSOR) 100 MG tablet Take 100 mg by mouth every morning. Also takes (3) 25 mg tabs (75 mg) in the evening  . metoprolol tartrate (LOPRESSOR) 25 MG tablet Take 75 mg by mouth every evening.  . Multiple Vitamin (MULTIVITAMIN) capsule Take 1 capsule by mouth daily.  . nitroGLYCERIN (NITROSTAT) 0.4 MG SL tablet Place 1 tablet (0.4 mg total) every 5 (five) minutes as needed under the tongue for chest pain. Max 3 doses.  . Omega-3 Fatty Acids (FISH OIL PO) Take 1,200 mg by mouth daily.   Marland Kitchen spironolactone (ALDACTONE) 25 MG tablet Take 25 mg by mouth daily as needed (fluid).   . warfarin (COUMADIN) 2 MG tablet Take as directed by [facility]  . [DISCONTINUED] lisinopril (PRINIVIL,ZESTRIL) 20 MG tablet Take 20 mg by mouth daily. For 30 days   No facility-administered encounter medications on file as of 03/11/2017.     Allergy:  Allergies  Allergen Reactions  . Bee Venom Anaphylaxis  . Other Nausea And Vomiting and Other (See Comments)    Pain medications cause severe vomiting. Tolerated slow IV morphine drip    Social Hx:   Social History   Socioeconomic History  . Marital status: Widowed    Spouse name: Not on file  . Number  of children: 4  . Years of education: Not on file  . Highest education level: Not on file  Social Needs  . Financial resource strain: Not on file  . Food insecurity - worry: Not on file  . Food insecurity - inability: Not on file  . Transportation needs - medical: Not on file  . Transportation needs - non-medical: Not on file  Occupational History  . Occupation: Retired Nurse, learning disability estate  Tobacco Use  . Smoking status: Former Smoker    Packs/day: 1.00    Years: 32.00    Pack years: 32.00    Types: Cigarettes    Last attempt to quit: 02/03/1974    Years since quitting: 43.1  . Smokeless tobacco: Never Used  Substance and Sexual Activity  . Alcohol use: Yes    Alcohol/week: 3.0 oz    Types: 5 Glasses of wine per week    Comment: 2017 WINE WITH DINNER   . Drug use: No  .  Sexual activity: No  Other Topics Concern  . Not on file  Social History Narrative   Lives with family.    Past Surgical Hx:  Past Surgical History:  Procedure Laterality Date  . ABDOMINAL HYSTERECTOMY    . CARDIAC CATHETERIZATION  09/03/2007   EF 70%; Failed attempt at PCI to OM  . CARDIAC CATHETERIZATION  11/01/2003   EF 70%  . CARDIAC CATHETERIZATION N/A 12/14/2015   Procedure: Left Heart Cath and Coronary Angiography;  Surgeon: Burnell Blanks, MD;  Location: [place] [facility];  Service: Cardiovascular;  Laterality: N/A;  . CARDIOVERSION  12/31/2011   Procedure: CARDIOVERSION;  Surgeon: [name], MD;  Location: [facility] ENDOSCOPY;  Service: Cardiovascular;  Laterality: N/A;  . CARDIOVERSION N/A 12/31/2011   Procedure: CARDIOVERSION;  Surgeon: [name], MD;  Location: [facility] CATH LAB;  Service: Cardiovascular;  Laterality: N/A;  . CATARACT EXTRACTION, BILATERAL  2015  . CHOLECYSTECTOMY  1980's  . COLON SURGERY  2004   colectomy for diverticulosis  . CORONARY ANGIOPLASTY WITH STENT PLACEMENT  2000; 08/11/2012; 11/12/2012   3 + 2 LAD & CFX; 2nd CFX stent 11/12/2012  . CORONARY  ATHERECTOMY N/A 07/15/2016   Procedure: Coronary Atherectomy;  Surgeon: Martinique, Peter M, MD;  Location: [place] [facility];  Service: Cardiovascular;  Laterality: N/A;  . CORONARY BALLOON ANGIOPLASTY N/A 07/14/2016   Procedure: Coronary Balloon Angioplasty;  Surgeon: Martinique, Peter M, MD;  Location: [place] [facility];  Service: Cardiovascular;  Laterality: N/A;  . CORONARY STENT INTERVENTION N/A 07/15/2016   Procedure: Coronary Stent Intervention;  Surgeon: Martinique, Peter M, MD;  Location: [facility];  Service: Cardiovascular;  Laterality: N/A;  . FRACTIONAL FLOW RESERVE WIRE  10/07/2013   Procedure: [place];  Surgeon: [name], MD;  Location: [facility] CATH LAB;  Service: Cardiovascular;;  . granulosa tumor excision  2000; 2003; 2004; 2007   "all in my abdomen including small intestines, outside my ?uterus/etc" (08/11/2012)  . HERNIA REPAIR  2005   "laparoscopic"  . LEFT HEART CATH AND CORONARY ANGIOGRAPHY N/A 07/14/2016   Procedure: Left Heart Cath and Coronary Angiography;  Surgeon: Martinique, Peter M, MD;  Location: [place] [facility];  Service: Cardiovascular;  Laterality: N/A;  . LEFT HEART CATHETERIZATION WITH CORONARY ANGIOGRAM N/A 11/12/2012   Procedure: LEFT HEART CATHETERIZATION WITH CORONARY ANGIOGRAM;  Surgeon: [name], MD;  Location: [facility] CATH LAB;  Service: Cardiovascular;  Laterality: N/A;  . LEFT HEART CATHETERIZATION WITH CORONARY ANGIOGRAM N/A 10/07/2013   Procedure: LEFT HEART CATHETERIZATION WITH CORONARY ANGIOGRAM;  Surgeon: [name], MD;  Location: [facility] CATH LAB;  Service: Cardiovascular;  Laterality: N/A;  . LEFT HEART CATHETERIZATION WITH CORONARY ANGIOGRAM N/A 12/14/2013   Procedure: LEFT HEART CATHETERIZATION WITH CORONARY ANGIOGRAM;  Surgeon: Sinclair Grooms, MD;  Location: [facility] CATH LAB;  Service: Cardiovascular;  Laterality: N/A;  . LEFT HEART CATHETERIZATION WITH CORONARY ANGIOGRAM N/A 05/16/2014   Procedure: LEFT HEART  CATHETERIZATION WITH CORONARY ANGIOGRAM;  Surgeon: Sherren Mocha, MD;  Location: [facility] CATH LAB;  Service: Cardiovascular;  Laterality: N/A;  . PERCUTANEOUS CORONARY INTERVENTION-BALLOON ONLY  08/04/2012   Procedure: PERCUTANEOUS CORONARY INTERVENTION-BALLOON ONLY;  Surgeon: [name], MD;  Location: [facility] CATH LAB;  Service: Cardiovascular;;  . PERCUTANEOUS CORONARY ROTOBLATOR INTERVENTION (PCI-R) N/A 08/11/2012   Procedure: PERCUTANEOUS CORONARY ROTOBLATOR INTERVENTION (PCI-R);  Surgeon: [name], MD;  Location: [facility] CATH LAB;  Service: Cardiovascular;  Laterality: N/A;  . PERCUTANEOUS CORONARY STENT  INTERVENTION (PCI-S)  10/07/2013   Procedure: PERCUTANEOUS CORONARY STENT INTERVENTION (PCI-S);  Surgeon: [name], MD;  Location: [facility] CATH LAB;  Service: Cardiovascular;;  . PERMANENT PACEMAKER INSERTION N/A 03/16/2012   [name] (SJM) leadless pacemaker (LEADLESS II STUDY PATEINT)  . SALIVARY GLAND SURGERY  2000's   "had a little lump removed; granulosa related; it was benign" (08/11/2012)  . TEE WITHOUT CARDIOVERSION  12/31/2011   Procedure: TRANSESOPHAGEAL ECHOCARDIOGRAM (TEE);  Surgeon: [name], MD;  Location: [place];  Service: Cardiovascular;  Laterality: N/A;  . VARICOSE VEIN SURGERY Bilateral 1977    Past Medical Hx:  Past Medical History:  Diagnosis Date  . Chronic anticoagulation - coumadin, CHADS2VASC=6 05/17/2015  . CKD (chronic kidney disease), stage III ([place])   . Combined systolic and diastolic heart failure ([place])   . Coronary artery disease    a. s/p multiple stents - stenting x 2 to the LAD and x 1 to the LCx in 2000, rotational arthrectomy to proximal LCx and DES to LAD in 2014 and DES to LCx in 2014, along with DES to LAD for re-in-stent stenosis in 2015, and DES to ostial LAD in 2016. b. 07/2016 - orbital atherectomy & DES to mid LCx.  . Diverticulitis   . Granulosa cell carcinoma ([place])    abd; last episode was in 2009  . Hyperlipidemia   .  Hypertension   . LBBB (left bundle branch block)       . Myocardial infarction ([place]) 2002  . Obesity   . OSA on CPAP   . Pacemaker failure    a. Prior leadless PPM with premature battery failure, being managed conservatively without replacement.  . Peripheral vascular disease ([place])   . Permanent atrial fibrillation ([place]) 2013  . Renal artery stenosis ([place])   . Tachycardia-bradycardia syndrome ([place])    a. s/p leadless pacemaker ([name]) implanted by Dr [name]  . Varicose veins     Past Gynecological History:  Granulosa cell tumor of the ovary No LMP recorded. Patient has had a hysterectomy.  Family Hx:  Family History  Problem Relation Age of Onset  . Stroke Mother   . Heart attack Father   . Diabetes Father   . Hypertension Father   . Heart attack Brother   . Diabetes Brother   . Hypertension Brother   . Kidney failure Brother     Review of Systems:  Constitutional  Feels well,    ENT Normal appearing ears and nares bilaterally Skin/Breast  No rash, sores, jaundice, itching, dryness Cardiovascular  No chest pain, shortness of breath, or edema  Pulmonary  No cough or wheeze.  Gastro Intestinal  No nausea, vomitting, or diarrhoea. No bright red blood per rectum, no abdominal pain, change in bowel movement, or constipation.  Genito Urinary  No frequency, urgency, dysuria, + pelvic pain Musculo Skeletal  No myalgia, arthralgia, joint swelling or pain  Neurologic  No weakness, numbness, change in gait,  Psychology  No depression, anxiety, insomnia.   Vitals:  Blood pressure (!) 158/74, pulse 61, temperature (!) 97 F (36.1 C), temperature source Oral, resp. rate 18, weight 177 lb (80.3 kg), SpO2 97 %.  Physical Exam: WD in NAD Neck  Supple NROM, without any enlargements.  Lymph Node Survey No cervical supraclavicular or inguinal adenopathy Cardiovascular  Pulse normal rate, regularity and rhythm. S1 and S2 normal.  Lungs  Clear to auscultation  bilateraly, without wheezes/crackles/rhonchi. Good air movement.  Skin  No rash/lesions/breakdown  Psychiatry  Alert  and oriented to person, place, and time  Abdomen  Normoactive bowel sounds, abdomen soft, non-tender and obese without evidence of hernia.  Back No CVA tenderness Genito Urinary  Vulva/vagina: Normal external female genitalia.   No lesions. No discharge or bleeding.  Bladder/urethra:  No lesions or masses, well supported bladder  Vagina: normal, surgically absent uterus and cervix.   Adnexa: no palpable masses. Rectal  Good tone, no masses no cul de sac nodularity.  Extremities  No bilateral cyanosis, clubbing or edema.   30 minutes of direct face to face counseling time was spent with the patient. This included discussion about prognosis, therapy recommendations.  Donaciano Eva, MD  03/11/2017, 5:48 PM

## 2017-03-12 ENCOUNTER — Inpatient Hospital Stay: Payer: Medicare Other

## 2017-03-12 DIAGNOSIS — Z90722 Acquired absence of ovaries, bilateral: Secondary | ICD-10-CM | POA: Diagnosis not present

## 2017-03-12 DIAGNOSIS — I5042 Chronic combined systolic (congestive) and diastolic (congestive) heart failure: Secondary | ICD-10-CM | POA: Diagnosis not present

## 2017-03-12 DIAGNOSIS — C569 Malignant neoplasm of unspecified ovary: Secondary | ICD-10-CM | POA: Diagnosis not present

## 2017-03-12 DIAGNOSIS — E1122 Type 2 diabetes mellitus with diabetic chronic kidney disease: Secondary | ICD-10-CM | POA: Diagnosis not present

## 2017-03-12 DIAGNOSIS — I2510 Atherosclerotic heart disease of native coronary artery without angina pectoris: Secondary | ICD-10-CM | POA: Diagnosis not present

## 2017-03-12 DIAGNOSIS — Z90710 Acquired absence of both cervix and uterus: Secondary | ICD-10-CM | POA: Diagnosis not present

## 2017-03-12 LAB — BASIC METABOLIC PANEL - CANCER CENTER ONLY
ANION GAP: 8 (ref 3–11)
BUN: 21 mg/dL (ref 7–26)
CALCIUM: 8.8 mg/dL (ref 8.4–10.4)
CHLORIDE: 101 mmol/L (ref 98–109)
CO2: 34 mmol/L — AB (ref 22–29)
Creatinine: 1.03 mg/dL (ref 0.60–1.10)
GFR, Est AFR Am: 56 mL/min — ABNORMAL LOW (ref >60)
GFR, Estimated: 49 mL/min — ABNORMAL LOW (ref >60)
Glucose, Bld: 109 mg/dL (ref 70–140)
Potassium: 3.7 mmol/L (ref 3.5–5.1)
Sodium: 143 mmol/L (ref 136–145)

## 2017-03-13 ENCOUNTER — Encounter (HOSPITAL_COMMUNITY)
Admission: RE | Admit: 2017-03-13 | Discharge: 2017-03-13 | Disposition: A | Discharge status: Home / Self Care | Payer: Self-pay | Source: Ambulatory Visit | Attending: Interventional Cardiology | Admitting: Interventional Cardiology

## 2017-03-14 DIAGNOSIS — S81831A Puncture wound without foreign body, right lower leg, initial encounter: Secondary | ICD-10-CM | POA: Diagnosis not present

## 2017-03-16 ENCOUNTER — Encounter (HOSPITAL_COMMUNITY)
Admission: RE | Admit: 2017-03-16 | Discharge: 2017-03-16 | Disposition: A | Discharge status: Home / Self Care | Payer: Self-pay | Source: Ambulatory Visit | Attending: Interventional Cardiology | Admitting: Interventional Cardiology

## 2017-03-17 DIAGNOSIS — S81831D Puncture wound without foreign body, right lower leg, subsequent encounter: Secondary | ICD-10-CM | POA: Diagnosis not present

## 2017-03-17 DIAGNOSIS — I10 Essential (primary) hypertension: Secondary | ICD-10-CM | POA: Diagnosis not present

## 2017-03-18 DIAGNOSIS — F4323 Adjustment disorder with mixed anxiety and depressed mood: Secondary | ICD-10-CM | POA: Diagnosis not present

## 2017-03-19 ENCOUNTER — Ambulatory Visit (HOSPITAL_COMMUNITY): Payer: Medicare Other

## 2017-03-20 ENCOUNTER — Encounter (HOSPITAL_COMMUNITY)
Admission: RE | Admit: 2017-03-20 | Discharge: 2017-03-20 | Disposition: A | Discharge status: Home / Self Care | Payer: Self-pay | Source: Ambulatory Visit | Attending: Interventional Cardiology | Admitting: Interventional Cardiology

## 2017-03-23 ENCOUNTER — Encounter (HOSPITAL_COMMUNITY)
Admission: RE | Admit: 2017-03-23 | Discharge: 2017-03-23 | Disposition: A | Discharge status: Home / Self Care | Payer: Self-pay | Source: Ambulatory Visit | Attending: Interventional Cardiology | Admitting: Interventional Cardiology

## 2017-03-24 ENCOUNTER — Encounter (HOSPITAL_COMMUNITY): Payer: Self-pay | Admitting: Radiology

## 2017-03-24 ENCOUNTER — Ambulatory Visit (HOSPITAL_COMMUNITY)
Admission: RE | Admit: 2017-03-24 | Discharge: 2017-03-24 | Disposition: A | Discharge status: Home / Self Care | Payer: Medicare Other | Source: Ambulatory Visit | Attending: Gynecologic Oncology | Admitting: Gynecologic Oncology

## 2017-03-24 DIAGNOSIS — K769 Liver disease, unspecified: Secondary | ICD-10-CM | POA: Diagnosis not present

## 2017-03-24 DIAGNOSIS — K439 Ventral hernia without obstruction or gangrene: Secondary | ICD-10-CM | POA: Insufficient documentation

## 2017-03-24 DIAGNOSIS — D490 Neoplasm of unspecified behavior of digestive system: Secondary | ICD-10-CM | POA: Diagnosis not present

## 2017-03-24 DIAGNOSIS — K469 Unspecified abdominal hernia without obstruction or gangrene: Secondary | ICD-10-CM | POA: Diagnosis not present

## 2017-03-24 DIAGNOSIS — C569 Malignant neoplasm of unspecified ovary: Secondary | ICD-10-CM | POA: Diagnosis not present

## 2017-03-24 DIAGNOSIS — R1904 Left lower quadrant abdominal swelling, mass and lump: Secondary | ICD-10-CM | POA: Diagnosis not present

## 2017-03-24 MED ORDER — IOPAMIDOL (ISOVUE-300) INJECTION 61%
INTRAVENOUS | Status: AC
  Administered 2017-03-24: 100 mL via INTRAVENOUS
  Filled 2017-03-24: qty 100

## 2017-03-25 DIAGNOSIS — F4323 Adjustment disorder with mixed anxiety and depressed mood: Secondary | ICD-10-CM | POA: Diagnosis not present

## 2017-03-27 ENCOUNTER — Telehealth: Payer: Self-pay | Admitting: Gynecologic Oncology

## 2017-03-27 ENCOUNTER — Encounter (HOSPITAL_COMMUNITY)
Admission: RE | Admit: 2017-03-27 | Discharge: 2017-03-27 | Disposition: A | Discharge status: Home / Self Care | Payer: Medicare Other | Source: Ambulatory Visit | Attending: Interventional Cardiology | Admitting: Interventional Cardiology

## 2017-03-27 NOTE — Telephone Encounter (Signed)
Patient informed of CT scan results.  No concerns voiced.  Advised to call for any needs. 

## 2017-03-30 ENCOUNTER — Encounter (HOSPITAL_COMMUNITY)
Admission: RE | Admit: 2017-03-30 | Discharge: 2017-03-30 | Disposition: A | Discharge status: Home / Self Care | Payer: Self-pay | Source: Ambulatory Visit | Attending: Interventional Cardiology | Admitting: Interventional Cardiology

## 2017-03-31 ENCOUNTER — Telehealth: Payer: Self-pay | Admitting: Internal Medicine

## 2017-03-31 MED ORDER — METOPROLOL TARTRATE 100 MG PO TABS: 100.0000 mg | ORAL_TABLET | ORAL | 3 refills | Status: DC

## 2017-03-31 MED ORDER — METOPROLOL TARTRATE 25 MG PO TABS: 75.0000 mg | ORAL_TABLET | Freq: Every evening | ORAL | 3 refills | Status: DC

## 2017-03-31 NOTE — Telephone Encounter (Signed)
Returned call to patient and made her aware that she should be taking:  - metoprolol tartrate 100 mg tablet: Take 1 tablet (100 mg) every morning  -metoprolol tartrate 25 mg tablet: Take 3 tablets (75 mg total) every evening  Patient verbalized understanding and thanked me for the call. Called [facility] on [address] and verified that they have the correct information on file.

## 2017-03-31 NOTE — Telephone Encounter (Signed)
Pt c/o medication issue:  1. Name of Medication: METOPROLOL 100/25  2. How are you currently taking this medication (dosage and times per day)? 100 mg morning 75 at night  3. Are you having a reaction (difficulty breathing--STAT)? none  4. What is your medication issue? Pt's question is How to take this medication? Per pt does not need 250 mg a day.   Pt needs some clarification.  Per pt Dr Irish Lack has also prescribed this medication.

## 2017-04-03 ENCOUNTER — Encounter (HOSPITAL_COMMUNITY)
Admission: RE | Admit: 2017-04-03 | Discharge: 2017-04-03 | Disposition: A | Discharge status: Home / Self Care | Payer: Self-pay | Source: Ambulatory Visit | Attending: Interventional Cardiology | Admitting: Interventional Cardiology

## 2017-04-03 DIAGNOSIS — I214 Non-ST elevation (NSTEMI) myocardial infarction: Secondary | ICD-10-CM | POA: Insufficient documentation

## 2017-04-03 DIAGNOSIS — Z955 Presence of coronary angioplasty implant and graft: Secondary | ICD-10-CM | POA: Insufficient documentation

## 2017-04-03 DIAGNOSIS — Z48812 Encounter for surgical aftercare following surgery on the circulatory system: Secondary | ICD-10-CM | POA: Insufficient documentation

## 2017-04-06 ENCOUNTER — Encounter (HOSPITAL_COMMUNITY)
Admission: RE | Admit: 2017-04-06 | Discharge: 2017-04-06 | Disposition: A | Discharge status: Home / Self Care | Payer: Medicare Other | Source: Ambulatory Visit | Attending: Interventional Cardiology | Admitting: Interventional Cardiology

## 2017-04-10 ENCOUNTER — Encounter (HOSPITAL_COMMUNITY)
Admission: RE | Admit: 2017-04-10 | Discharge: 2017-04-10 | Disposition: A | Discharge status: Home / Self Care | Payer: Self-pay | Source: Ambulatory Visit | Attending: Interventional Cardiology | Admitting: Interventional Cardiology

## 2017-04-13 ENCOUNTER — Encounter (HOSPITAL_COMMUNITY)
Admission: RE | Admit: 2017-04-13 | Discharge: 2017-04-13 | Disposition: A | Discharge status: Home / Self Care | Payer: Self-pay | Source: Ambulatory Visit | Attending: Interventional Cardiology | Admitting: Interventional Cardiology

## 2017-04-13 DIAGNOSIS — F4323 Adjustment disorder with mixed anxiety and depressed mood: Secondary | ICD-10-CM | POA: Diagnosis not present

## 2017-04-17 ENCOUNTER — Encounter (HOSPITAL_COMMUNITY)
Admission: RE | Admit: 2017-04-17 | Discharge: 2017-04-17 | Disposition: A | Discharge status: Home / Self Care | Payer: Medicare Other | Source: Ambulatory Visit | Attending: Interventional Cardiology | Admitting: Interventional Cardiology

## 2017-04-17 ENCOUNTER — Other Ambulatory Visit: Payer: Self-pay | Admitting: Internal Medicine

## 2017-04-20 ENCOUNTER — Encounter (HOSPITAL_COMMUNITY)
Admission: RE | Admit: 2017-04-20 | Discharge: 2017-04-20 | Disposition: A | Discharge status: Home / Self Care | Payer: Self-pay | Source: Ambulatory Visit | Attending: Interventional Cardiology | Admitting: Interventional Cardiology

## 2017-04-21 ENCOUNTER — Other Ambulatory Visit: Payer: Self-pay | Admitting: Interventional Cardiology

## 2017-04-24 ENCOUNTER — Encounter (HOSPITAL_COMMUNITY)
Admission: RE | Admit: 2017-04-24 | Discharge: 2017-04-24 | Disposition: A | Discharge status: Home / Self Care | Payer: Self-pay | Source: Ambulatory Visit | Attending: Interventional Cardiology | Admitting: Interventional Cardiology

## 2017-04-24 ENCOUNTER — Ambulatory Visit (INDEPENDENT_AMBULATORY_CARE_PROVIDER_SITE_OTHER): Payer: Medicare Other | Admitting: *Deleted

## 2017-04-24 DIAGNOSIS — I4891 Unspecified atrial fibrillation: Secondary | ICD-10-CM | POA: Diagnosis not present

## 2017-04-24 DIAGNOSIS — I481 Persistent atrial fibrillation: Secondary | ICD-10-CM | POA: Diagnosis not present

## 2017-04-24 DIAGNOSIS — I482 Chronic atrial fibrillation: Secondary | ICD-10-CM | POA: Diagnosis not present

## 2017-04-24 DIAGNOSIS — I4821 Permanent atrial fibrillation: Secondary | ICD-10-CM

## 2017-04-24 DIAGNOSIS — Z5181 Encounter for therapeutic drug level monitoring: Secondary | ICD-10-CM | POA: Diagnosis not present

## 2017-04-24 DIAGNOSIS — I4819 Other persistent atrial fibrillation: Secondary | ICD-10-CM

## 2017-04-24 LAB — POCT INR: INR: 2

## 2017-04-24 NOTE — Patient Instructions (Signed)
Description   Continue on same dose of 2 mg daily except 1 mg on Mondays and Fridays. Keep dark greens consistent. Recheck INR in 6 weeks. Main #[phone]. [facility] # [phone]     

## 2017-04-27 ENCOUNTER — Encounter (HOSPITAL_COMMUNITY)
Admission: RE | Admit: 2017-04-27 | Discharge: 2017-04-27 | Disposition: A | Discharge status: Home / Self Care | Payer: Self-pay | Source: Ambulatory Visit | Attending: Interventional Cardiology | Admitting: Interventional Cardiology

## 2017-04-27 DIAGNOSIS — F4323 Adjustment disorder with mixed anxiety and depressed mood: Secondary | ICD-10-CM | POA: Diagnosis not present

## 2017-05-01 ENCOUNTER — Encounter (HOSPITAL_COMMUNITY): Payer: Self-pay

## 2017-05-02 ENCOUNTER — Other Ambulatory Visit: Payer: Self-pay | Admitting: Interventional Cardiology

## 2017-05-04 ENCOUNTER — Encounter (HOSPITAL_COMMUNITY): Payer: Self-pay

## 2017-05-04 DIAGNOSIS — I214 Non-ST elevation (NSTEMI) myocardial infarction: Secondary | ICD-10-CM | POA: Insufficient documentation

## 2017-05-04 DIAGNOSIS — Z955 Presence of coronary angioplasty implant and graft: Secondary | ICD-10-CM | POA: Insufficient documentation

## 2017-05-04 DIAGNOSIS — Z48812 Encounter for surgical aftercare following surgery on the circulatory system: Secondary | ICD-10-CM | POA: Insufficient documentation

## 2017-05-07 ENCOUNTER — Other Ambulatory Visit: Payer: Self-pay | Admitting: *Deleted

## 2017-05-08 ENCOUNTER — Encounter (HOSPITAL_COMMUNITY)
Admission: RE | Admit: 2017-05-08 | Discharge: 2017-05-08 | Disposition: A | Discharge status: Home / Self Care | Payer: Self-pay | Source: Ambulatory Visit | Attending: Interventional Cardiology | Admitting: Interventional Cardiology

## 2017-05-11 ENCOUNTER — Ambulatory Visit (INDEPENDENT_AMBULATORY_CARE_PROVIDER_SITE_OTHER): Payer: Medicare Other | Admitting: Internal Medicine

## 2017-05-11 ENCOUNTER — Encounter: Payer: Self-pay | Admitting: Internal Medicine

## 2017-05-11 ENCOUNTER — Encounter (HOSPITAL_COMMUNITY): Payer: Self-pay

## 2017-05-11 VITALS — BP 126/50 | HR 60 | Ht 60.0 in | Wt 181.0 lb

## 2017-05-11 DIAGNOSIS — Z950 Presence of cardiac pacemaker: Secondary | ICD-10-CM | POA: Diagnosis not present

## 2017-05-11 DIAGNOSIS — I483 Typical atrial flutter: Secondary | ICD-10-CM

## 2017-05-11 DIAGNOSIS — I10 Essential (primary) hypertension: Secondary | ICD-10-CM

## 2017-05-11 DIAGNOSIS — I2510 Atherosclerotic heart disease of native coronary artery without angina pectoris: Secondary | ICD-10-CM

## 2017-05-11 DIAGNOSIS — I495 Sick sinus syndrome: Secondary | ICD-10-CM | POA: Diagnosis not present

## 2017-05-11 DIAGNOSIS — I4819 Other persistent atrial fibrillation: Secondary | ICD-10-CM

## 2017-05-11 DIAGNOSIS — I481 Persistent atrial fibrillation: Secondary | ICD-10-CM | POA: Diagnosis not present

## 2017-05-11 LAB — CUP PACEART INCLINIC DEVICE CHECK
Implantable Lead Implant Date: 20181224
Implantable Lead Location: 753859
Lead Channel Setting Pacing Amplitude: 3.5 V
Lead Channel Setting Pacing Pulse Width: 0.4 ms
MDC IDC LEAD IMPLANT DT: 20181224
MDC IDC LEAD LOCATION: 753860
MDC IDC PG IMPLANT DT: 20181224
MDC IDC SESS DTM: 20190408172208
MDC IDC SET LEADCHNL RA PACING AMPLITUDE: 3.5 V
MDC IDC SET LEADCHNL RV SENSING SENSITIVITY: 2 mV
Pulse Gen Model: 2272
Pulse Gen Serial Number: 8968200

## 2017-05-11 NOTE — Patient Instructions (Addendum)
Medication Instructions:  Your physician has recommended you make the following change in your medication:  1.  Dr. [name] would like you to decrease your metoprolol to 75 mg by mouth twice a day. You could split your metoprolol 100 mg in half and take with one whole metoprolol 25 mg to make 75 mg. Let me know how you do.  Sonia Baller RN [phone]  Labwork: None ordered.  Testing/Procedures: None ordered.  Follow-Up: You will follow up with Chanetta Marshall, NP in 3 months.   Your physician wants you to follow-up in: 6 months with Dr. [name].    Remote monitoring is used to monitor your Pacemaker from home. This monitoring reduces the number of office visits required to check your device to one time per year. It allows [place] to keep an eye on the functioning of your device to ensure it is working properly. You are scheduled for a device check from home on 08/10/2017. You may send your transmission at any time that day. If you have a wireless device, the transmission will be sent automatically. After your physician reviews your transmission, you will receive a postcard with your next transmission date.  Any Other Special Instructions Will Be Listed Below (If Applicable).  If you need a refill on your cardiac medications before your next appointment, please call your pharmacy.

## 2017-05-11 NOTE — Progress Notes (Signed)
PCP: [name], MD Primary Cardiologist:  Dr Irish Lack Primary EP:  Dr Aurea Graff Patty [name] is a 84 y.o. female who presents today for routine electrophysiology followup.  Since last being seen in our clinic, the patient reports doing reasonably well.  She has fatigue.  This is slowly improving.  Today, she denies symptoms of palpitations, chest pain, shortness of breath,  lower extremity edema, dizziness, presyncope, or syncope.  The patient is otherwise without complaint today.   Past Medical History:  Diagnosis Date  . Chronic anticoagulation - coumadin, CHADS2VASC=6 05/17/2015  . CKD (chronic kidney disease), stage III ([place])   . Combined systolic and diastolic heart failure ([place])   . Coronary artery disease    a. s/p multiple stents - stenting x 2 to the LAD and x 1 to the LCx in 2000, rotational arthrectomy to proximal LCx and DES to LAD in 2014 and DES to LCx in 2014, along with DES to LAD for re-in-stent stenosis in 2015, and DES to ostial LAD in 2016. b. 07/2016 - orbital atherectomy & DES to mid LCx.  . Diverticulitis   . Granulosa cell carcinoma ([place])    abd; last episode was in 2009  . Hyperlipidemia   . Hypertension   . LBBB (left bundle branch block)       . Myocardial infarction ([place]) 2002  . Obesity   . OSA on CPAP   . Pacemaker failure    a. Prior leadless PPM with premature battery failure, being managed conservatively without replacement.  . Peripheral vascular disease ([place])   . Permanent atrial fibrillation ([place]) 2013  . Renal artery stenosis ([place])   . Tachycardia-bradycardia syndrome ([place])    a. s/p leadless pacemaker ([name]) implanted by Dr [name]  . Varicose veins    Past Surgical History:  Procedure Laterality Date  . ABDOMINAL HYSTERECTOMY    . CARDIAC CATHETERIZATION  09/03/2007   EF 70%; Failed attempt at PCI to OM  . CARDIAC CATHETERIZATION  11/01/2003   EF 70%  . CARDIAC CATHETERIZATION N/A 12/14/2015   Procedure: Left Heart Cath and  Coronary Angiography;  Surgeon: Burnell Blanks, MD;  Location: [place] [facility] LAB;  Service: Cardiovascular;  Laterality: N/A;  . CARDIOVERSION  12/31/2011   Procedure: CARDIOVERSION;  Surgeon: [name], MD;  Location: [facility] ENDOSCOPY;  Service: Cardiovascular;  Laterality: N/A;  . CARDIOVERSION N/A 12/31/2011   Procedure: CARDIOVERSION;  Surgeon: [name], MD;  Location: [facility] CATH LAB;  Service: Cardiovascular;  Laterality: N/A;  . CATARACT EXTRACTION, BILATERAL  2015  . CHOLECYSTECTOMY  1980's  . COLON SURGERY  2004   colectomy for diverticulosis  . CORONARY ANGIOPLASTY WITH STENT PLACEMENT  2000; 08/11/2012; 11/12/2012   3 + 2 LAD & CFX; 2nd CFX stent 11/12/2012  . CORONARY ATHERECTOMY N/A 07/15/2016   Procedure: Coronary Atherectomy;  Surgeon: Martinique, Peter M, MD;  Location: [place];  Service: Cardiovascular;  Laterality: N/A;  . CORONARY BALLOON ANGIOPLASTY N/A 07/14/2016   Procedure: Coronary Balloon Angioplasty;  Surgeon: Martinique, Peter M, MD;  Location: [place];  Service: Cardiovascular;  Laterality: N/A;  . CORONARY STENT INTERVENTION N/A 07/15/2016   Procedure: Coronary Stent Intervention;  Surgeon: Martinique, Peter M, MD;  Location: [facility] CV LAB;  Service: Cardiovascular;  Laterality: N/A;  . FRACTIONAL FLOW RESERVE WIRE  10/07/2013   Procedure: Xenia;  Surgeon: [name], MD;  Location: [facility] CATH LAB;  Service: Cardiovascular;;  .  granulosa tumor excision  2000; 2003; 2004; 2007   "all in my abdomen including small intestines, outside my ?uterus/etc" (08/11/2012)  . HERNIA REPAIR  2005   "laparoscopic"  . LEFT HEART CATH AND CORONARY ANGIOGRAPHY N/A 07/14/2016   Procedure: Left Heart Cath and Coronary Angiography;  Surgeon: Martinique, Peter M, MD;  Location: [place] LAB;  Service: Cardiovascular;  Laterality: N/A;  . LEFT HEART CATHETERIZATION WITH CORONARY ANGIOGRAM N/A 11/12/2012   Procedure: LEFT HEART  CATHETERIZATION WITH CORONARY ANGIOGRAM;  Surgeon: [name], MD;  Location: [facility] C[facility] LAB;  Service: Cardiovascular;  Laterality: N/A;  . LEFT HEART CATHETERIZATION WITH CORONARY ANGIOGRAM N/A 10/07/2013   Procedure: LEFT HEART CATHETERIZATION WITH CORONARY ANGIOGRAM;  Surgeon: [name], MD;  Location: [facility] CATH LAB;  Service: Cardiovascular;  Laterality: N/A;  . LEFT HEART CATHETERIZATION WITH CORONARY ANGIOGRAM N/A 12/14/2013   Procedure: LEFT HEART CATHETERIZATION WITH CORONARY ANGIOGRAM;  Surgeon: Sinclair Grooms, MD;  Location: [facility] CATH LAB;  Service: Cardiovascular;  Laterality: N/A;  . LEFT HEART CATHETERIZATION WITH CORONARY ANGIOGRAM N/A 05/16/2014   Procedure: LEFT HEART CATHETERIZATION WITH CORONARY ANGIOGRAM;  Surgeon: Sherren Mocha, MD;  Location: [facility] CATH LAB;  Service: Cardiovascular;  Laterality: N/A;  . PERCUTANEOUS CORONARY INTERVENTION-BALLOON ONLY  08/04/2012   Procedure: PERCUTANEOUS CORONARY INTERVENTION-BALLOON ONLY;  Surgeon: [name], MD;  Location: [facility] CATH LAB;  Service: Cardiovascular;;  . PERCUTANEOUS CORONARY ROTOBLATOR INTERVENTION (PCI-R) N/A 08/11/2012   Procedure: PERCUTANEOUS CORONARY ROTOBLATOR INTERVENTION (PCI-R);  Surgeon: [name], MD;  Location: [facility] CATH LAB;  Service: Cardiovascular;  Laterality: N/A;  . PERCUTANEOUS CORONARY STENT INTERVENTION (PCI-S)  10/07/2013   Procedure: PERCUTANEOUS CORONARY STENT INTERVENTION (PCI-S);  Surgeon: [name], MD;  Location: [facility] CATH LAB;  Service: Cardiovascular;;  . PERMANENT PACEMAKER INSERTION N/A 03/16/2012   [name] (SJM) leadless pacemaker (LEADLESS II STUDY PATEINT)  . SALIVARY GLAND SURGERY  2000's   "had a little lump removed; granulosa related; it was benign" (08/11/2012)  . TEE WITHOUT CARDIOVERSION  12/31/2011   Procedure: TRANSESOPHAGEAL ECHOCARDIOGRAM (TEE);  Surgeon: [name], MD;  Location: [facility];  Service: Cardiovascular;  Laterality: N/A;  .  VARICOSE VEIN SURGERY Bilateral [place] are reviewed and negative except as per HPI above  Current Outpatient Medications  Medication Sig Dispense Refill  . atorvastatin (LIPITOR) 80 MG tablet TAKE 1 TABLET BY MOUTH EVERY MORNING 90 tablet 2  . beta carotene w/minerals (OCUVITE) tablet Take 1 tablet by mouth 2 (two) times daily.     . Calcium Carb-Cholecalciferol (CALTRATE 600+D) 600-800 MG-UNIT TABS Take 1 tablet by mouth every evening.     . clopidogrel (PLAVIX) 75 MG tablet TAKE 1 TABLET BY MOUTH EVERY DAY WITH BREAKFAST 90 tablet 3  . Coenzyme Q-10 100 MG capsule Take 200 mg by mouth daily.     Marland Kitchen diltiazem (CARDIZEM CD) 120 MG 24 hr capsule TAKE 1 CAPSULE (120 MG TOTAL) BY MOUTH AT BEDTIME. 90 capsule 3  . diltiazem (CARDIZEM CD) 240 MG 24 hr capsule Take 1 capsule (240 mg total) by mouth daily. With breakfast 90 capsule 3  . furosemide (LASIX) 40 MG tablet Take 80 mg by mouth daily.    . isosorbide mononitrate (IMDUR) 60 MG 24 hr tablet TAKE 1 TABLET BY MOUTH DAILY 30 tablet 9  . KLOR-CON M20 20 MEQ tablet TAKE 1 TABLET (20 MEQ TOTAL) BY MOUTH DAILY. 30 tablet 9  . metoprolol tartrate (LOPRESSOR) 100  MG tablet Take 1 tablet (100 mg total) by mouth every morning. Also takes (3) 25 mg tabs (75 mg) in the evening 90 tablet 3  . metoprolol tartrate (LOPRESSOR) 25 MG tablet Take 3 tablets (75 mg total) by mouth every evening. 270 tablet 3  . Multiple Vitamin (MULTIVITAMIN) capsule Take 1 capsule by mouth daily.    . nitroGLYCERIN (NITROSTAT) 0.4 MG SL tablet Place 1 tablet (0.4 mg total) every 5 (five) minutes as needed under the tongue for chest pain. Max 3 doses. 25 tablet 3  . Omega-3 Fatty Acids (FISH OIL PO) Take 1,200 mg by mouth daily.     Marland Kitchen spironolactone (ALDACTONE) 25 MG tablet Take 25 mg by mouth daily as needed (fluid).  45 tablet 11  . warfarin (COUMADIN) 2 MG tablet Take as directed by [facility] 30 tablet 3   No current facility-administered medications  for this visit.     Physical Exam: Vitals:   05/11/17 1521  BP: (!) 126/50  Pulse: 60  SpO2: 95%  Weight: 181 lb (82.1 kg)  Height: 5' (1.524 m)    GEN- The patient is well appearing, alert and oriented x 3 today.   Head- normocephalic, atraumatic Eyes-  Sclera clear, conjunctiva pink Ears- hearing intact Oropharynx- clear Lungs- Clear to ausculation bilaterally, normal work of breathing Chest- pacemaker pocket is well healed Heart- Regular rate and rhythm, no murmurs, rubs or gallops, PMI not laterally displaced GI- soft, NT, ND, + BS Extremities- no clubbing, cyanosis, or edema  Pacemaker interrogation- reviewed in detail today,  See PACEART report  ekg tracing ordered today is personally reviewed and shows atrial paced rhythm, LBBB  Assessment and Plan:  1. Symptomatic sinus bradycardia  Normal pacemaker function See Pace Art report AF burden alert is turned off today Of note, she has RF "timed out" today, possibly due to AF burden alert.  Will reduce metoprolol due to fatigue (see below).  Consider making rate response more sensitive upon return  2. Persistent afib Previously permanent, now in afib only 16% Continue coumadin for chads2vasc score of 5 She has fatigue I will reduce metoprolol to 75mg  BID at this time.  3. CAD No ischemic symptoms  4. HTN Stable No change required today.  5. OSA Compliant with CPAP  [place] Return to see EP NP in 3 months I will see in 6 months  Thompson Grayer MD, [facility] 05/11/2017 3:42 PM

## 2017-05-14 DIAGNOSIS — M79604 Pain in right leg: Secondary | ICD-10-CM | POA: Diagnosis not present

## 2017-05-15 ENCOUNTER — Encounter (HOSPITAL_COMMUNITY): Payer: Self-pay

## 2017-05-18 ENCOUNTER — Encounter (HOSPITAL_COMMUNITY)
Admission: RE | Admit: 2017-05-18 | Discharge: 2017-05-18 | Disposition: A | Discharge status: Home / Self Care | Payer: Self-pay | Source: Ambulatory Visit | Attending: Interventional Cardiology | Admitting: Interventional Cardiology

## 2017-05-18 ENCOUNTER — Telehealth: Payer: Self-pay | Admitting: Internal Medicine

## 2017-05-18 DIAGNOSIS — F4323 Adjustment disorder with mixed anxiety and depressed mood: Secondary | ICD-10-CM | POA: Diagnosis not present

## 2017-05-18 NOTE — Telephone Encounter (Signed)
Noted.  No action needed

## 2017-05-18 NOTE — Telephone Encounter (Signed)
1.  Dr. [name] would like you to decrease your metoprolol to  AS INDICATED BELOW IN THE PTS AVS WITH DR ALLRED FROM 05/11/17, SHE IS CALLING TO REPORT HER CURRENT SYMPTOMS, BP, AND HR SINCE METOPROLOL WAS DECREASED:  Pt is calling to let Sonia Baller RN with Dr [name] know that since they decreased her metoprolol last week, her BP is climbing up and last reading was 157/71 and HR-61.  Pt states with the decrease she "isn't feeling like herself." Pt states that she then took it upon herself and increased her metoprolol back to her original dose that Dr [name] had her on for what she reports "he's had me on for years."  Pt states she is back on her old regimen of metoprolol at taking 175 mg po daily.  She takes 100 mg in the am, then she takes #3 of the 25 mg tabs of metoprolol to equal 75 mg po in the pm.  Pt states with switching back to her old regimen, she is feeling more like herself, is asymptomatic, and now her BP is 130/60 and HR-71.  Pt wanted to let Sonia Baller RN know this, for she advised that she call back to endorse this to her.  Advised the pt to continue her regimen, and continue to monitor, and I will route this message to Sonia Baller for further review and further follow-up with the pt. Pt verbalized understanding and agrees with this plan.          75 mg by mouth twice a day. You could split your metoprolol 100 mg in half and take with one whole metoprolol 25 mg to make 75 mg. Let me know how you do.  Sonia Baller RN [phone]  Labwork: None ordered.  Testing/Procedures: None ordered.  Follow-Up: You will follow up with Chanetta Marshall, NP in 3 months.   Your physician wants you to follow-up in: 6 months with Dr. [name].    Remote monitoring is used to monitor your Pacemaker from home. This monitoring reduces the number of office visits required to check your device to one time per year. It allows [place] to keep an eye on the functioning of your device to ensure it is working properly. You  are scheduled for a device check from home on 08/10/2017. You may send your transmission at any time that day. If you have a wireless device, the transmission will be sent automatically. After your physician reviews your transmission, you will receive a postcard with your next transmission date.  Any Other Special Instructions Will Be Listed Below (If Applicable).  If you need a refill on your cardiac medications before your next appointment, please call your pharmacy.

## 2017-05-18 NOTE — Telephone Encounter (Signed)
New Message   Pt c/o medication issue:  1. Name of Medication:   metoprolol tartrate (LOPRESSOR)    2. How are you currently taking this medication (dosage and times per day)? 100mg  in morning and 75mg  at bedtime   3. Are you having a reaction (difficulty breathing--STAT)?   4. What is your medication issue? Patient is calling because when she was in  She was told to take 75mg  in the morning and 75mg  at night. She said that she was not feeling like herself. So she went back to 100mg  in the morning and 75mg  at bedtime. Now she feels better.

## 2017-05-22 ENCOUNTER — Encounter (HOSPITAL_COMMUNITY): Payer: Self-pay

## 2017-05-25 ENCOUNTER — Encounter (HOSPITAL_COMMUNITY): Payer: Self-pay

## 2017-05-27 ENCOUNTER — Encounter (HOSPITAL_COMMUNITY): Payer: Self-pay

## 2017-05-27 ENCOUNTER — Other Ambulatory Visit: Payer: Self-pay | Admitting: Interventional Cardiology

## 2017-05-29 ENCOUNTER — Encounter (HOSPITAL_COMMUNITY): Payer: Self-pay

## 2017-05-29 ENCOUNTER — Encounter (HOSPITAL_COMMUNITY)
Admission: RE | Admit: 2017-05-29 | Discharge: 2017-05-29 | Disposition: A | Discharge status: Home / Self Care | Payer: Self-pay | Source: Ambulatory Visit | Attending: Interventional Cardiology | Admitting: Interventional Cardiology

## 2017-06-01 ENCOUNTER — Encounter (HOSPITAL_COMMUNITY)
Admission: RE | Admit: 2017-06-01 | Discharge: 2017-06-01 | Disposition: A | Discharge status: Home / Self Care | Payer: Self-pay | Source: Ambulatory Visit | Attending: Interventional Cardiology | Admitting: Interventional Cardiology

## 2017-06-01 ENCOUNTER — Encounter (HOSPITAL_COMMUNITY): Payer: Self-pay

## 2017-06-02 DIAGNOSIS — F4323 Adjustment disorder with mixed anxiety and depressed mood: Secondary | ICD-10-CM | POA: Diagnosis not present

## 2017-06-03 ENCOUNTER — Telehealth: Payer: Self-pay | Admitting: *Deleted

## 2017-06-03 ENCOUNTER — Encounter (HOSPITAL_COMMUNITY): Payer: Self-pay

## 2017-06-03 DIAGNOSIS — Z48812 Encounter for surgical aftercare following surgery on the circulatory system: Secondary | ICD-10-CM | POA: Insufficient documentation

## 2017-06-03 DIAGNOSIS — I214 Non-ST elevation (NSTEMI) myocardial infarction: Secondary | ICD-10-CM | POA: Insufficient documentation

## 2017-06-03 DIAGNOSIS — Z955 Presence of coronary angioplasty implant and graft: Secondary | ICD-10-CM | POA: Insufficient documentation

## 2017-06-03 NOTE — Telephone Encounter (Signed)
Returned the patient's call and left a message with the date/time of the appt for May 22nd at 2pm

## 2017-06-05 ENCOUNTER — Ambulatory Visit (INDEPENDENT_AMBULATORY_CARE_PROVIDER_SITE_OTHER): Payer: Medicare Other | Admitting: *Deleted

## 2017-06-05 ENCOUNTER — Encounter (HOSPITAL_COMMUNITY)
Admission: RE | Admit: 2017-06-05 | Discharge: 2017-06-05 | Disposition: A | Discharge status: Home / Self Care | Payer: Self-pay | Source: Ambulatory Visit | Attending: Interventional Cardiology | Admitting: Interventional Cardiology

## 2017-06-05 DIAGNOSIS — I4891 Unspecified atrial fibrillation: Secondary | ICD-10-CM | POA: Diagnosis not present

## 2017-06-05 DIAGNOSIS — I4821 Permanent atrial fibrillation: Secondary | ICD-10-CM

## 2017-06-05 DIAGNOSIS — Z5181 Encounter for therapeutic drug level monitoring: Secondary | ICD-10-CM

## 2017-06-05 DIAGNOSIS — I482 Chronic atrial fibrillation: Secondary | ICD-10-CM | POA: Diagnosis not present

## 2017-06-05 LAB — POCT INR: INR: 2.3

## 2017-06-05 NOTE — Patient Instructions (Signed)
Description   Continue on same dose of 2 mg daily except 1 mg on Mondays and Fridays. Keep dark greens consistent. Recheck INR in 6 weeks. Main #[phone]. [facility] # [phone]     

## 2017-06-08 ENCOUNTER — Encounter (HOSPITAL_COMMUNITY)
Admission: RE | Admit: 2017-06-08 | Discharge: 2017-06-08 | Disposition: A | Discharge status: Home / Self Care | Payer: Self-pay | Source: Ambulatory Visit | Attending: Interventional Cardiology | Admitting: Interventional Cardiology

## 2017-06-10 ENCOUNTER — Encounter (HOSPITAL_COMMUNITY): Payer: Self-pay

## 2017-06-12 ENCOUNTER — Encounter (HOSPITAL_COMMUNITY): Payer: Self-pay

## 2017-06-12 ENCOUNTER — Telehealth: Payer: Self-pay | Admitting: Interventional Cardiology

## 2017-06-12 NOTE — Telephone Encounter (Signed)
Advised patient to avoid salt in her diet and continue to monitor her BP. Instructed patient to let us know if her BP is consistently elevated or if she develops symptoms. Patient verbalized understanding and thanked me for the call.

## 2017-06-12 NOTE — Telephone Encounter (Signed)
Patient calling and states that her BP has been elevated the past 24 hours. She states that last night her BP was 167/79 HR 70 and 8 AM this morning 162/77 HR 68. Patient states that she just took her morning meds. Patient states that her SBP is usually 140s. Patient states that her vision is slightly blurred, but this is nothing new as she states that she needs new glasses. Patient denies CP, SOB, lightheadedness, dizziness, syncope, or any other symptoms. Patient also states that she ate bad yesterday. Patient takes metoprolol tartrate 100 mg in the AM and 75 mg in the PM. Patient also takes diltiazem 240 mg in the AM and 120 mg in the PM. Advised patient to avoid salt in her diet. Made patient aware that she needs to check her BP 1-2 hours after she has had her meds and after she has been seated rested for about 10 minutes with her feet flat on the floor and not immediately following a meal. Instructed patient to let us know if her BP is consistently elevated or if she develops symptoms. Patient verbalized understanding and thanked me for the call.

## 2017-06-12 NOTE — Telephone Encounter (Signed)
Follow up    Patient is calling to advised that her BP at @1 :30 was 138/60 and at 3:22pm 140/67. She states that she was instructed to call back and give the numbers.

## 2017-06-12 NOTE — Telephone Encounter (Signed)
New Message   Pt c/o BP issue:  1. What are your last 5 BP readings? 162/77 2. Are you having any other symptoms (ex. Dizziness, headache, blurred vision, passed out)? Slight blurred vision  3. What is your medication issue? No   Patient states that the past 24 hours her BP has been elevated it has been at least in the 160's.

## 2017-06-15 ENCOUNTER — Encounter (HOSPITAL_COMMUNITY)
Admission: RE | Admit: 2017-06-15 | Discharge: 2017-06-15 | Disposition: A | Discharge status: Home / Self Care | Payer: Self-pay | Source: Ambulatory Visit | Attending: Interventional Cardiology | Admitting: Interventional Cardiology

## 2017-06-16 DIAGNOSIS — F4323 Adjustment disorder with mixed anxiety and depressed mood: Secondary | ICD-10-CM | POA: Diagnosis not present

## 2017-06-17 ENCOUNTER — Encounter (HOSPITAL_COMMUNITY): Payer: Self-pay

## 2017-06-19 ENCOUNTER — Encounter (HOSPITAL_COMMUNITY)
Admission: RE | Admit: 2017-06-19 | Discharge: 2017-06-19 | Disposition: A | Discharge status: Home / Self Care | Payer: Medicare Other | Source: Ambulatory Visit | Attending: Interventional Cardiology | Admitting: Interventional Cardiology

## 2017-06-22 ENCOUNTER — Encounter (HOSPITAL_COMMUNITY): Payer: Self-pay

## 2017-06-23 DIAGNOSIS — F4323 Adjustment disorder with mixed anxiety and depressed mood: Secondary | ICD-10-CM | POA: Diagnosis not present

## 2017-06-23 DIAGNOSIS — G4733 Obstructive sleep apnea (adult) (pediatric): Secondary | ICD-10-CM | POA: Diagnosis not present

## 2017-06-24 ENCOUNTER — Inpatient Hospital Stay: Payer: Medicare Other | Attending: Gynecologic Oncology | Admitting: Gynecologic Oncology

## 2017-06-24 ENCOUNTER — Encounter: Payer: Self-pay | Admitting: Gynecologic Oncology

## 2017-06-24 VITALS — BP 141/47 | HR 58 | Temp 97.8°F | Resp 18 | Ht 60.0 in | Wt 179.5 lb

## 2017-06-24 DIAGNOSIS — Z90710 Acquired absence of both cervix and uterus: Secondary | ICD-10-CM | POA: Diagnosis not present

## 2017-06-24 DIAGNOSIS — Z8543 Personal history of malignant neoplasm of ovary: Secondary | ICD-10-CM | POA: Insufficient documentation

## 2017-06-24 DIAGNOSIS — Z87891 Personal history of nicotine dependence: Secondary | ICD-10-CM | POA: Diagnosis not present

## 2017-06-24 DIAGNOSIS — F4323 Adjustment disorder with mixed anxiety and depressed mood: Secondary | ICD-10-CM | POA: Diagnosis not present

## 2017-06-24 DIAGNOSIS — R1903 Right lower quadrant abdominal swelling, mass and lump: Secondary | ICD-10-CM

## 2017-06-24 DIAGNOSIS — Z90722 Acquired absence of ovaries, bilateral: Secondary | ICD-10-CM | POA: Insufficient documentation

## 2017-06-24 DIAGNOSIS — C569 Malignant neoplasm of unspecified ovary: Secondary | ICD-10-CM

## 2017-06-24 NOTE — Patient Instructions (Signed)
Please notify Dr Denman George at phone number [phone] if you notice vaginal bleeding, new pelvic or abdominal pains, bloating, feeling full easy, or a change in bladder or bowel function.   Please return to see Dr Denman George in September, 2019 after receiving a CT scan.

## 2017-06-24 NOTE — Progress Notes (Signed)
Follow-up Note: Gyn-Onc  Consult was requested by Dr. Alvy Bimler for the evaluation of Patty [name] 84 y.o. female  CC:  Chief Complaint  Patient presents with  . Malignant granulosa cell tumor of ovary, unspecified lateral    Assessment/Plan:  [name]  is a 84 y.o.  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  She is considering her options still. Would like to hold off therapy for now while asymptomatic. I will see her again in September (with a CT immediately pre-appointment) to re-evaluate and plan for moving forward).   HPI: Patty [name] is an 84 year old woman who is seen in consultation at the request of Dr Alvy Bimler for granulosa cell ovarian cancer.   The patient's history began in approximately 2000 when she underwent ex lap, TAH, BSO with Dr Raylene Miyamoto at [facility]. She did not receive adjuvant chemotherapy. Postoperatively she had recurrences in the peritoneum in 2004, 2007 and 2011.  The 2004 surgery was with Dr Excell Seltzer and included resection of the recurrent tumor with small bowel resection and colectomy for diverticulosis. She has had a laparoscopic hernia repair with mesh in 2005. In 2007 she underwent resection of pelvic tumor recurrence with Dr Excell Seltzer and ventral hernia repair with mesh.  In 2011 Dr Excell Seltzer performed a laparoscopic resection of suprapubic tumor. Her last CT was in 2010.   She has significant CAD with 6 stents and a history of CHF. She has a pacemaker which she states has a nonfunctioning battery and she is not currently paced. This was placed for tachycadia-bradycardia syndrome. She is obese with OSA. She has atrial fibrillation and is on coumadin.   In October, 2018 she began noting intermittent right lower quadrant pains. Labs:  CA 125 12/01/16: 35 Inhibin B 11/21/16: 119  CT abd/pelvis 12/09/16: A small suprapubic hernia is seen along the inferior margin of the surgical mass containing a loop of small bowel.  No evidence of bowel obstruction or ischemia. Multiple new soft tissue masses are seen in the left suprapubic region and left lower quadrant, consistent with recurrent tumor. Largest in the left suprapubic region measures 3.8 x 2.8 cm, and 3.8 x 3.4 cm in left lower quadrant. There is also a new mass in the central small bowel mesenteric measuring 5.8 x 5.0 cm. These findings are consistent with recurrent carcinoma and peritoneal metastases.  She was provided with 3 options for proceeding with treatment of her recurrence:  1/ surgical resection and hernia repair. This will involve exploratory laparotomy, radical tumor debulking, possible bowel resection, possible partial cystectomy, and hernia repair with Dr Excell Seltzer and myself.  She will require medical clearance as she has significant cardiac issues.  2/ chemotherapy with carboplatin and paclitaxel. I discussed that this may not achieve as long a remission as surgical intervention, but would involve a large radical procedure.   3/ A third alternative is hormonal therapy with progestin and tamoxifen in rotation.  She was not sure about which route to pursue, and given that she was asymptomatic with this slow growing tumor, took some time to reflect. In December, 2018 she received replacement of her pacemaker and was placed on Plavix and coumadin.  Interval Hx: She has met with her cardiologists, Dr [name] and Dr Irish Lack, who feel that she is a candidate for any of the approaches, however, would prefer until she was 1 year out from her last coronary catheterization procedure to be taken off anticoagulants.  CT abd/pelvis in  February, 2019 showed essentially stable disease with some mild increases in tumor size, and some decreased, but no new lesions.  She remains asymptomatic.     Current Meds:  Outpatient Encounter Medications as of 06/24/2017  Medication Sig  . atorvastatin (LIPITOR) 80 MG tablet TAKE 1 TABLET BY MOUTH EVERY MORNING  . beta  carotene w/minerals (OCUVITE) tablet Take 1 tablet by mouth 2 (two) times daily.   . Calcium Carb-Cholecalciferol (CALTRATE 600+D) 600-800 MG-UNIT TABS Take 1 tablet by mouth every evening.   . clopidogrel (PLAVIX) 75 MG tablet TAKE 1 TABLET BY MOUTH EVERY DAY WITH BREAKFAST  . Coenzyme Q-10 100 MG capsule Take 200 mg by mouth daily.   Marland Kitchen diltiazem (CARDIZEM CD) 120 MG 24 hr capsule TAKE 1 CAPSULE (120 MG TOTAL) BY MOUTH AT BEDTIME.  Marland Kitchen diltiazem (CARDIZEM CD) 240 MG 24 hr capsule Take 1 capsule (240 mg total) by mouth daily. With breakfast  . furosemide (LASIX) 40 MG tablet Take 80 mg by mouth daily.  . isosorbide mononitrate (IMDUR) 60 MG 24 hr tablet TAKE 1 TABLET BY MOUTH DAILY  . KLOR-CON M20 20 MEQ tablet TAKE 1 TABLET (20 MEQ TOTAL) BY MOUTH DAILY.  . metoprolol tartrate (LOPRESSOR) 100 MG tablet Take 1 tablet (100 mg total) by mouth every morning. Also takes (3) 25 mg tabs (75 mg) in the evening  . metoprolol tartrate (LOPRESSOR) 25 MG tablet Take 3 tablets (75 mg total) by mouth every evening.  . Multiple Vitamin (MULTIVITAMIN) capsule Take 1 capsule by mouth daily.  . nitroGLYCERIN (NITROSTAT) 0.4 MG SL tablet PLACE 1 TABLET (0.4 MG TOTAL) UNDER THE TONGUE EVERY 5 (FIVE) MINUTES AS NEEDED. FOR CHEST PAIN  . Omega-3 Fatty Acids (FISH OIL PO) Take 1,200 mg by mouth daily.   Marland Kitchen spironolactone (ALDACTONE) 25 MG tablet Take 25 mg by mouth daily as needed (fluid).   . warfarin (COUMADIN) 2 MG tablet Take as directed by [facility]   No facility-administered encounter medications on file as of 06/24/2017.     Allergy:  Allergies  Allergen Reactions  . Bee Venom Anaphylaxis  . Other Nausea And Vomiting and Other (See Comments)    Pain medications cause severe vomiting. Tolerated slow IV morphine drip    Social Hx:   Social History   Socioeconomic History  . Marital status: Widowed    Spouse name: Not on file  . Number of children: 4  . Years of education: Not on file  . Highest  education level: Not on file  Occupational History  . Occupation: Retired Nurse, learning disability estate  Social Needs  . Financial resource strain: Not on file  . Food insecurity:    Worry: Not on file    Inability: Not on file  . Transportation needs:    Medical: Not on file    Non-medical: Not on file  Tobacco Use  . Smoking status: Former Smoker    Packs/day: 1.00    Years: 32.00    Pack years: 32.00    Types: Cigarettes    Last attempt to quit: 02/03/1974    Years since quitting: 43.4  . Smokeless tobacco: Never Used  Substance and Sexual Activity  . Alcohol use: Yes    Alcohol/week: 3.0 oz    Types: 5 Glasses of wine per week    Comment: 2017 WINE WITH DINNER   . Drug use: No  . Sexual activity: Never  Lifestyle  . Physical activity:    Days per week: Not  on file    Minutes per session: Not on file  . Stress: Not on file  Relationships  . Social connections:    Talks on phone: Not on file    Gets together: Not on file    Attends religious service: Not on file    Active member of club or organization: Not on file    Attends meetings of clubs or organizations: Not on file    Relationship status: Not on file  . Intimate partner violence:    Fear of current or ex partner: Not on file    Emotionally abused: Not on file    Physically abused: Not on file    Forced sexual activity: Not on file  Other Topics Concern  . Not on file  Social History Narrative   Lives with family.    Past Surgical Hx:  Past Surgical History:  Procedure Laterality Date  . ABDOMINAL HYSTERECTOMY    . CARDIAC CATHETERIZATION  09/03/2007   EF 70%; Failed attempt at PCI to OM  . CARDIAC CATHETERIZATION  11/01/2003   EF 70%  . CARDIAC CATHETERIZATION N/A 12/14/2015   Procedure: Left Heart Cath and Coronary Angiography;  Surgeon: Burnell Blanks, MD;  Location: [place] CV LAB;  Service: Cardiovascular;  Laterality: N/A;  . CARDIOVERSION  12/31/2011   Procedure: CARDIOVERSION;  Surgeon:  [name], MD;  Location: [facility] ENDOSCOPY;  Service: Cardiovascular;  Laterality: N/A;  . CARDIOVERSION N/A 12/31/2011   Procedure: CARDIOVERSION;  Surgeon: [name], MD;  Location: [facility] CATH LAB;  Service: Cardiovascular;  Laterality: N/A;  . CATARACT EXTRACTION, BILATERAL  2015  . CHOLECYSTECTOMY  1980's  . COLON SURGERY  2004   colectomy for diverticulosis  . CORONARY ANGIOPLASTY WITH STENT PLACEMENT  2000; 08/11/2012; 11/12/2012   3 + 2 LAD & CFX; 2nd CFX stent 11/12/2012  . CORONARY ATHERECTOMY N/A 07/15/2016   Procedure: Coronary Atherectomy;  Surgeon: Martinique, Peter M, MD;  Location: [place] CV LAB;  Service: Cardiovascular;  Laterality: N/A;  . CORONARY BALLOON ANGIOPLASTY N/A 07/14/2016   Procedure: Coronary Balloon Angioplasty;  Surgeon: Martinique, Peter M, MD;  Location: [place] CV LAB;  Service: Cardiovascular;  Laterality: N/A;  . CORONARY STENT INTERVENTION N/A 07/15/2016   Procedure: Coronary Stent Intervention;  Surgeon: Martinique, Peter M, MD;  Location: [place] CV LAB;  Service: Cardiovascular;  Laterality: N/A;  . FRACTIONAL FLOW RESERVE WIRE  10/07/2013   Procedure: [place];  Surgeon: [name], MD;  Location: [facility] CATH LAB;  Service: Cardiovascular;;  . granulosa tumor excision  2000; 2003; 2004; 2007   "all in my abdomen including small intestines, outside my ?uterus/etc" (08/11/2012)  . HERNIA REPAIR  2005   "laparoscopic"  . LEFT HEART CATH AND CORONARY ANGIOGRAPHY N/A 07/14/2016   Procedure: Left Heart Cath and Coronary Angiography;  Surgeon: Martinique, Peter M, MD;  Location: [place] CV LAB;  Service: Cardiovascular;  Laterality: N/A;  . LEFT HEART CATHETERIZATION WITH CORONARY ANGIOGRAM N/A 11/12/2012   Procedure: LEFT HEART CATHETERIZATION WITH CORONARY ANGIOGRAM;  Surgeon: [name], MD;  Location[facility] C[facility] LAB;  Service: Cardiovascular;  Laterality: N/A;  . LEFT HEART CATHETERIZATION WITH CORONARY ANGIOGRAM N/A  10/07/2013   Procedure: LEFT HEART CATHETERIZATION WITH CORONARY ANGIOGRAM;  Surgeon: [name], MD;  Location: [facility] CATH LAB;  Service: Cardiovascular;  Laterality: N/A;  . LEFT HEART CATHETERIZATION WITH CORONARY ANGIOGRAM N/A 12/14/2013   Procedure: LEFT HEART CATHETERIZATION WITH CORONARY ANGIOGRAM;  Surgeon: Sinclair Grooms, MD;  Location: [facility] CATH LAB;  Service: Cardiovascular;  Laterality: N/A;  . LEFT HEART CATHETERIZATION WITH CORONARY ANGIOGRAM N/A 05/16/2014   Procedure: LEFT HEART CATHETERIZATION WITH CORONARY ANGIOGRAM;  Surgeon: Sherren Mocha, MD;  Location: [facility] CATH LAB;  Service: Cardiovascular;  Laterality: N/A;  . PERCUTANEOUS CORONARY INTERVENTION-BALLOON ONLY  08/04/2012   Procedure: PERCUTANEOUS CORONARY INTERVENTION-BALLOON ONLY;  Surgeon: [name], MD;  Location: [facility] CATH LAB;  Service: Cardiovascular;;  . PERCUTANEOUS CORONARY ROTOBLATOR INTERVENTION (PCI-R) N/A 08/11/2012   Procedure: PERCUTANEOUS CORONARY ROTOBLATOR INTERVENTION (PCI-R);  Surgeon: [name], MD;  Location: [facility] CATH LAB;  Service: Cardiovascular;  Laterality: N/A;  . PERCUTANEOUS CORONARY STENT INTERVENTION (PCI-S)  10/07/2013   Procedure: PERCUTANEOUS CORONARY STENT INTERVENTION (PCI-S);  Surgeon: [name], MD;  Location: [facility] CATH LAB;  Service: Cardiovascular;;  . PERMANENT PACEMAKER INSERTION N/A 03/16/2012   [name] (SJM) leadless pacemaker (LEADLESS II STUDY PATEINT)  . SALIVARY GLAND SURGERY  2000's   "had a little lump removed; granulosa related; it was benign" (08/11/2012)  . TEE WITHOUT CARDIOVERSION  12/31/2011   Procedure: TRANSESOPHAGEAL ECHOCARDIOGRAM (TEE);  Surgeon: [name], MD;  Location: [place];  Service: Cardiovascular;  Laterality: N/A;  . VARICOSE VEIN SURGERY Bilateral 1977    Past Medical Hx:  Past Medical History:  Diagnosis Date  . Chronic anticoagulation - coumadin, CHADS2VASC=6 05/17/2015  . CKD (chronic kidney disease), stage III  ([place])   . Combined systolic and diastolic heart failure ([place])   . Coronary artery disease    a. s/p multiple stents - stenting x 2 to the LAD and x 1 to the LCx in 2000, rotational arthrectomy to proximal LCx and DES to LAD in 2014 and DES to LCx in 2014, along with DES to LAD for re-in-stent stenosis in 2015, and DES to ostial LAD in 2016. b. 07/2016 - orbital atherectomy & DES to mid LCx.  . Diverticulitis   . Granulosa cell carcinoma ([place])    abd; last episode was in 2009  . Hyperlipidemia   . Hypertension   . LBBB (left bundle branch block)       . Myocardial infarction ([place]) 2002  . Obesity   . OSA on CPAP   . Pacemaker failure    a. Prior leadless PPM with premature battery failure, being managed conservatively without replacement.  . Peripheral vascular disease ([place])   . Permanent atrial fibrillation ([place]) 2013  . Renal artery stenosis ([place])   . Tachycardia-bradycardia syndrome ([place])    a. s/p leadless pacemaker ([name]) implanted by Dr [name]  . Varicose veins     Past Gynecological History:  Granulosa cell tumor of the ovary No LMP recorded. Patient has had a hysterectomy.  Family Hx:  Family History  Problem Relation Age of Onset  . Stroke Mother   . Heart attack Father   . Diabetes Father   . Hypertension Father   . Heart attack Brother   . Diabetes Brother   . Hypertension Brother   . Kidney failure Brother     Review of Systems:  Constitutional  Feels well,    ENT Normal appearing ears and nares bilaterally Skin/Breast  No rash, sores, jaundice, itching, dryness Cardiovascular  No chest pain, shortness of breath, or edema  Pulmonary  No cough or wheeze.  Gastro Intestinal  No nausea, vomitting, or diarrhoea. No bright red blood per rectum, no abdominal pain, change in bowel movement, or constipation.  Genito Urinary  No frequency, urgency, dysuria, + pelvic pain Musculo Skeletal  No myalgia, arthralgia, joint swelling or pain  Neurologic  No  weakness, numbness, change in gait,  Psychology  No depression, anxiety, insomnia.   Vitals:  Blood pressure (!) 141/47, pulse (!) 58, temperature 97.8 F (36.6 C), temperature source Oral, resp. rate 18, height 5' (1.524 m), weight 179 lb 8 oz (81.4 kg).  Physical Exam: WD in NAD Neck  Supple NROM, without any enlargements.  Lymph Node Survey No cervical supraclavicular or inguinal adenopathy Cardiovascular  Pulse normal rate, regularity and rhythm. S1 and S2 normal.  Lungs  Clear to auscultation bilateraly, without wheezes/crackles/rhonchi. Good air movement.  Skin  No rash/lesions/breakdown  Psychiatry  Alert and oriented to person, place, and time  Abdomen  Normoactive bowel sounds, abdomen soft, non-tender and obese without evidence of hernia.  Back No CVA tenderness Genito Urinary  Vulva/vagina: Normal external female genitalia.   No lesions. No discharge or bleeding.  Bladder/urethra:  No lesions or masses, well supported bladder  Vagina: normal, surgically absent uterus and cervix.   Adnexa: no palpable masses. Rectal  Good tone, no masses no cul de sac nodularity.  Extremities  No bilateral cyanosis, clubbing or edema.  Thereasa Solo, MD  06/24/2017, 2:13 PM

## 2017-06-26 ENCOUNTER — Encounter (HOSPITAL_COMMUNITY): Payer: Self-pay

## 2017-06-29 ENCOUNTER — Encounter (HOSPITAL_COMMUNITY): Payer: Self-pay

## 2017-06-29 ENCOUNTER — Other Ambulatory Visit: Payer: Self-pay

## 2017-06-29 ENCOUNTER — Emergency Department (HOSPITAL_COMMUNITY)
Admission: EM | Admit: 2017-06-29 | Discharge: 2017-06-29 | Disposition: A | Discharge status: Home / Self Care | Payer: Medicare Other | Attending: Emergency Medicine | Admitting: Emergency Medicine

## 2017-06-29 ENCOUNTER — Emergency Department (HOSPITAL_COMMUNITY): Payer: Medicare Other

## 2017-06-29 DIAGNOSIS — R0789 Other chest pain: Secondary | ICD-10-CM | POA: Diagnosis not present

## 2017-06-29 DIAGNOSIS — Z79899 Other long term (current) drug therapy: Secondary | ICD-10-CM | POA: Insufficient documentation

## 2017-06-29 DIAGNOSIS — Z7901 Long term (current) use of anticoagulants: Secondary | ICD-10-CM | POA: Insufficient documentation

## 2017-06-29 DIAGNOSIS — R079 Chest pain, unspecified: Secondary | ICD-10-CM | POA: Diagnosis not present

## 2017-06-29 DIAGNOSIS — N183 Chronic kidney disease, stage 3 (moderate): Secondary | ICD-10-CM | POA: Insufficient documentation

## 2017-06-29 DIAGNOSIS — I5040 Unspecified combined systolic (congestive) and diastolic (congestive) heart failure: Secondary | ICD-10-CM | POA: Insufficient documentation

## 2017-06-29 DIAGNOSIS — I130 Hypertensive heart and chronic kidney disease with heart failure and stage 1 through stage 4 chronic kidney disease, or unspecified chronic kidney disease: Secondary | ICD-10-CM | POA: Insufficient documentation

## 2017-06-29 DIAGNOSIS — R0602 Shortness of breath: Secondary | ICD-10-CM | POA: Diagnosis not present

## 2017-06-29 DIAGNOSIS — Z87891 Personal history of nicotine dependence: Secondary | ICD-10-CM | POA: Insufficient documentation

## 2017-06-29 DIAGNOSIS — B029 Zoster without complications: Secondary | ICD-10-CM | POA: Insufficient documentation

## 2017-06-29 LAB — BASIC METABOLIC PANEL
ANION GAP: 8 (ref 5–15)
BUN: 16 mg/dL (ref 6–20)
CO2: 31 mmol/L (ref 22–32)
Calcium: 8.6 mg/dL — ABNORMAL LOW (ref 8.9–10.3)
Chloride: 102 mmol/L (ref 101–111)
Creatinine, Ser: 1.08 mg/dL — ABNORMAL HIGH (ref 0.44–1.00)
GFR calc Af Amer: 53 mL/min — ABNORMAL LOW (ref >60)
GFR, EST NON AFRICAN AMERICAN: 46 mL/min — AB (ref >60)
Glucose, Bld: 142 mg/dL — ABNORMAL HIGH (ref 65–99)
Potassium: 3.3 mmol/L — ABNORMAL LOW (ref 3.5–5.1)
SODIUM: 141 mmol/L (ref 135–145)

## 2017-06-29 LAB — CBC
HEMATOCRIT: 39.1 % (ref 36.0–46.0)
Hemoglobin: 12.7 g/dL (ref 12.0–15.0)
MCH: 30.8 pg (ref 26.0–34.0)
MCHC: 32.5 g/dL (ref 30.0–36.0)
MCV: 94.7 fL (ref 78.0–100.0)
PLATELETS: 200 10*3/uL (ref 150–400)
RBC: 4.13 MIL/uL (ref 3.87–5.11)
RDW: 14.7 % (ref 11.5–15.5)
WBC: 4.7 10*3/uL (ref 4.0–10.5)

## 2017-06-29 LAB — I-STAT TROPONIN, ED
TROPONIN I, POC: 0.08 ng/mL (ref 0.00–0.08)
Troponin i, poc: 0.02 ng/mL (ref 0.00–0.08)

## 2017-06-29 LAB — PROTIME-INR
INR: 2.14
Prothrombin Time: 23.7 seconds — ABNORMAL HIGH (ref 11.4–15.2)

## 2017-06-29 LAB — BRAIN NATRIURETIC PEPTIDE: B Natriuretic Peptide: 288.2 pg/mL — ABNORMAL HIGH (ref 0.0–100.0)

## 2017-06-29 MED ORDER — ACYCLOVIR 400 MG PO TABS: 400.0000 mg | ORAL_TABLET | Freq: Four times a day (QID) | ORAL | 0 refills | Status: DC

## 2017-06-29 MED ORDER — ACYCLOVIR 200 MG PO CAPS
400.0000 mg | ORAL_CAPSULE | Freq: Once | ORAL | Status: AC
  Administered 2017-06-29: 400 mg via ORAL
  Filled 2017-06-29: qty 2

## 2017-06-29 NOTE — ED Notes (Signed)
Pt stable, alert and oriented. Pt phoned friend to pick her up. Pt provided prescription. Pt states understanding of discharge instructions.

## 2017-06-29 NOTE — ED Notes (Signed)
Patient transported to X-ray 

## 2017-06-29 NOTE — Discharge Instructions (Addendum)
Your chest pain is likely due to a shingle outbreak. Please take acyclovir as prescribed for the full duration.  Follow up closely with your doctor for further care.  Do not hesitate to return to the ER if your condition worsen or if you have any concerns.

## 2017-06-29 NOTE — ED Triage Notes (Signed)
Pt arrived via [facility] from home c/o central chest pain.  Pt takes warfarin and Plavix at home, no ASA given, pt took 2 SL Nitro at home.  Pt has internal pacemaker placed within [employer] research study 5 years ago.

## 2017-06-29 NOTE — ED Provider Notes (Signed)
[facility] EMERGENCY DEPARTMENT Provider Note   CSN: [id number] Arrival date & time: 06/29/17  1127     History   Chief Complaint Chief Complaint  Patient presents with  . Chest Pain    HPI Patty [name] is a 84 y.o. female.  HPI   84 year old female with history of CAD status post multiple stents, currently on Coumadin, CKD, CHF, has pacemaker, permanent atrial fibrillation presenting for evaluation of chest pain.  Patient was brought here via EMS from home.  Patient report for the past 3 days she has been experiencing pain primarily to the right side of her chest radiates to her jaws and to her shoulders and back.  Pain is described as a pressure tightness sensation with occasional stinging sensation.  Pain is not associate with shortness of breath, lightheadedness or nausea.  She does feel a bit clammy this morning.  She initially thought the pain is related to some heavy lifting several days prior but due to the persistence of the pain and her history of cardiac disease, she decided to come for further evaluation.  She report taking 2 sublingual nitro this morning which did help with the pain.  She is currently pain-free.  Patient has a Medtronic pacemaker.  Past Medical History:  Diagnosis Date  . Chronic anticoagulation - coumadin, CHADS2VASC=6 05/17/2015  . CKD (chronic kidney disease), stage III ([place])   . Combined systolic and diastolic heart failure ([place])   . Coronary artery disease    a. s/p multiple stents - stenting x 2 to the LAD and x 1 to the LCx in 2000, rotational arthrectomy to proximal LCx and DES to LAD in 2014 and DES to LCx in 2014, along with DES to LAD for re-in-stent stenosis in 2015, and DES to ostial LAD in 2016. b. 07/2016 - orbital atherectomy & DES to mid LCx.  . Diverticulitis   . Granulosa cell carcinoma ([place])    abd; last episode was in 2009  . Hyperlipidemia   . Hypertension   . LBBB (left bundle branch block)       .  Myocardial infarction ([place]) 2002  . Obesity   . OSA on CPAP   . Pacemaker failure    a. Prior leadless PPM with premature battery failure, being managed conservatively without replacement.  . Peripheral vascular disease ([place])   . Permanent atrial fibrillation ([place]) 2013  . Renal artery stenosis ([place])   . Tachycardia-bradycardia syndrome ([place])    a. s/p leadless pacemaker ([name]) implanted by Dr [name]  . Varicose veins     Patient Active Problem List   Diagnosis Date Noted  . Persistent atrial fibrillation ([place])   . Warfarin anticoagulation   . DOE (dyspnea on exertion)   . Status post coronary artery stent placement   . Normocytic anemia 07/11/2016  . History of small bowel obstruction 12/22/2015  . Permanent atrial fibrillation with RVR ([place]) 12/15/2015  . Demand ischemia ([place]) 12/15/2015  . NSTEMI (non-ST elevated myocardial infarction) ([place]) 05/18/2015  . Chronic anticoagulation - coumadin, CHADS2VASC=6 05/17/2015  . Unstable angina ([place]) 05/17/2015  . Swelling of lower extremity 04/18/2015  . Encounter for therapeutic drug monitoring 08/25/2014  . 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[place]) 06/16/2014  . Ischemic chest pain   . OSA on CPAP 05/14/2014  . Chest pain 01/05/2014  . Atrial flutter ([place])   . Hypokalemia   . Pacemaker - St Jude Leadless PPM   . Combined systolic and diastolic heart  failure, NYHA class 3 ([place])   . Tachycardia-bradycardia syndrome ([place]) 09/30/2012  . Atrial fibrillation with RVR ([place]) 12/28/2011  . Ventral hernia 10/16/2011  . Granulosa cell tumor of ovary 04/23/2011  . CAD (coronary artery disease) 10/03/2010  . Essential hypertension 10/03/2010  . Hyperlipidemia 10/03/2010  . Renal artery stenosis ([place]) 10/03/2010  . Obesity 10/03/2010    Past Surgical History:  Procedure Laterality Date  . ABDOMINAL HYSTERECTOMY    . CARDIAC CATHETERIZATION  09/03/2007   EF 70%; Failed attempt at PCI to OM  . CARDIAC CATHETERIZATION  11/01/2003     EF 70%  . CARDIAC CATHETERIZATION N/A 12/14/2015   Procedure: Left Heart Cath and Coronary Angiography;  Surgeon: Burnell Blanks, MD;  Location: [facility] CV LAB;  Service: Cardiovascular;  Laterality: N/A;  . CARDIOVERSION  12/31/2011   Procedure: CARDIOVERSION;  Surgeon: [name], MD;  Location: [facility] ENDOSCOPY;  Service: Cardiovascular;  Laterality: N/A;  . CARDIOVERSION N/A 12/31/2011   Procedure: CARDIOVERSION;  Surgeon: [name], MD;  Location: [facility] LAB;  Service: Cardiovascular;  Laterality: N/A;  . CATARACT EXTRACTION, BILATERAL  2015  . CHOLECYSTECTOMY  1980's  . COLON SURGERY  2004   colectomy for diverticulosis  . CORONARY ANGIOPLASTY WITH STENT PLACEMENT  2000; 08/11/2012; 11/12/2012   3 + 2 LAD & CFX; 2nd CFX stent 11/12/2012  . CORONARY ATHERECTOMY N/A 07/15/2016   Procedure: Coronary Atherectomy;  Surgeon: Martinique, Peter M, MD;  Location: [place] CV LAB;  Service: Cardiovascular;  Laterality: N/A;  . CORONARY BALLOON ANGIOPLASTY N/A 07/14/2016   Procedure: Coronary Balloon Angioplasty;  Surgeon: Martinique, Peter M, MD;  Location: [place] CV LAB;  Service: Cardiovascular;  Laterality: N/A;  . CORONARY STENT INTERVENTION N/A 07/15/2016   Procedure: Coronary Stent Intervention;  Surgeon: Martinique, Peter M, MD;  Location: [place] CV LAB;  Service: Cardiovascular;  Laterality: N/A;  . FRACTIONAL FLOW RESERVE WIRE  10/07/2013   Procedure: [place];  Surgeon: [name], MD;  Location: [facility] CATH LAB;  Service: Cardiovascular;;  . granulosa tumor excision  2000; 2003; 2004; 2007   "all in my abdomen including small intestines, outside my ?uterus/etc" (08/11/2012)  . HERNIA REPAIR  2005   "laparoscopic"  . LEFT HEART CATH AND CORONARY ANGIOGRAPHY N/A 07/14/2016   Procedure: Left Heart Cath and Coronary Angiography;  Surgeon: Martinique, Peter M, MD;  Location: [place] CV LAB;  Service: Cardiovascular;  Laterality: N/A;  .  LEFT HEART CATHETERIZATION WITH CORONARY ANGIOGRAM N/A 11/12/2012   Procedure: LEFT HEART CATHETERIZATION WITH CORONARY ANGIOGRAM;  Surgeon: [name], MD;  Location: [facility] CATH LAB;  Service: Cardiovascular;  Laterality: N/A;  . LEFT HEART CATHETERIZATION WITH CORONARY ANGIOGRAM N/A 10/07/2013   Procedure: LEFT HEART CATHETERIZATION WITH CORONARY ANGIOGRAM;  Surgeon: [name], MD;  Location: [facility] CATH LAB;  Service: Cardiovascular;  Laterality: N/A;  . LEFT HEART CATHETERIZATION WITH CORONARY ANGIOGRAM N/A 12/14/2013   Procedure: LEFT HEART CATHETERIZATION WITH CORONARY ANGIOGRAM;  Surgeon: Sinclair Grooms, MD;  Location: [facility] CATH LAB;  Service: Cardiovascular;  Laterality: N/A;  . LEFT HEART CATHETERIZATION WITH CORONARY ANGIOGRAM N/A 05/16/2014   Procedure: LEFT HEART CATHETERIZATION WITH CORONARY ANGIOGRAM;  Surgeon: Sherren Mocha, MD;  Location: [facility] CATH LAB;  Service: Cardiovascular;  Laterality: N/A;  . PERCUTANEOUS CORONARY INTERVENTION-BALLOON ONLY  08/04/2012   Procedure: PERCUTANEOUS CORONARY INTERVENTION-BALLOON ONLY;  Surgeon: [name], MD;  Location: [facility] CATH LAB;  Service: Cardiovascular;;  . PERCUTANEOUS  CORONARY ROTOBLATOR INTERVENTION (PCI-R) N/A 08/11/2012   Procedure: PERCUTANEOUS CORONARY ROTOBLATOR INTERVENTION (PCI-R);  Surgeon: [name], MD;  Location: [facility] CATH LAB;  Service: Cardiovascular;  Laterality: N/A;  . PERCUTANEOUS CORONARY STENT INTERVENTION (PCI-S)  10/07/2013   Procedure: PERCUTANEOUS CORONARY STENT INTERVENTION (PCI-S);  Surgeon: [name], MD;  Location: [facility] CATH LAB;  Service: Cardiovascular;;  . PERMANENT PACEMAKER INSERTION N/A 03/16/2012   [name] (SJM) leadless pacemaker (LEADLESS II STUDY PATEINT)  . SALIVARY GLAND SURGERY  2000's   "had a little lump removed; granulosa related; it was benign" (08/11/2012)  . TEE WITHOUT CARDIOVERSION  12/31/2011   Procedure: TRANSESOPHAGEAL ECHOCARDIOGRAM (TEE);  Surgeon: [name], MD;  Location: [facility];  Service: Cardiovascular;  Laterality: N/A;  . VARICOSE VEIN SURGERY Bilateral 1977     OB History   None      Home Medications    Prior to Admission medications   Medication Sig Start Date End Date Taking? Authorizing Provider  atorvastatin (LIPITOR) 80 MG tablet TAKE 1 TABLET BY MOUTH EVERY MORNING 05/04/17   [name], MD  beta carotene w/minerals (OCUVITE) tablet Take 1 tablet by mouth 2 (two) times daily.     [provider]  Calcium Carb-Cholecalciferol (CALTRATE 600+D) 600-800 MG-UNIT TABS Take 1 tablet by mouth every evening.     [provider]  clopidogrel (PLAVIX) 75 MG tablet TAKE 1 TABLET BY MOUTH EVERY DAY WITH BREAKFAST 01/19/17   [name], MD  Coenzyme Q-10 100 MG capsule Take 200 mg by mouth daily.     [provider]  diltiazem (CARDIZEM CD) 120 MG 24 hr capsule TAKE 1 CAPSULE (120 MG TOTAL) BY MOUTH AT BEDTIME. 08/12/16   Dunn, Dayna N, PA-C  diltiazem (CARDIZEM CD) 240 MG 24 hr capsule Take 1 capsule (240 mg total) by mouth daily. With breakfast 09/09/16   [name], MD  furosemide (LASIX) 40 MG tablet Take 80 mg by mouth daily.    [provider]  isosorbide mononitrate (IMDUR) 60 MG 24 hr tablet TAKE 1 TABLET BY MOUTH DAILY 04/17/17   Seiler, Amber K, NP  KLOR-CON M20 20 MEQ tablet TAKE 1 TABLET (20 MEQ TOTAL) BY MOUTH DAILY. 09/22/16   [name], MD  metoprolol tartrate (LOPRESSOR) 100 MG tablet Take 1 tablet (100 mg total) by mouth every morning. Also takes (3) 25 mg tabs (75 mg) in the evening 03/31/17   Allred, Jeneen Rinks, MD  metoprolol tartrate (LOPRESSOR) 25 MG tablet Take 3 tablets (75 mg total) by mouth every evening. 03/31/17   Thompson Grayer, MD  Multiple Vitamin (MULTIVITAMIN) capsule Take 1 capsule by mouth daily.    [provider]  nitroGLYCERIN (NITROSTAT) 0.4 MG SL tablet PLACE 1 TABLET (0.4 MG TOTAL) UNDER THE TONGUE EVERY 5 (FIVE)  MINUTES AS NEEDED. FOR CHEST PAIN 05/28/17   Thompson Grayer, MD  Omega-3 Fatty Acids (FISH OIL PO) Take 1,200 mg by mouth daily.     [provider]  spironolactone (ALDACTONE) 25 MG tablet Take 25 mg by mouth daily as needed (fluid).  05/21/15   [name], MD  warfarin (COUMADIN) 2 MG tablet Take as directed by [facility] 12/31/16   [name], MD    Family History Family History  Problem Relation Age of Onset  . Stroke Mother   . Heart attack Father   . Diabetes Father   . Hypertension Father   . Heart attack Brother   .  Diabetes Brother   . Hypertension Brother   . Kidney failure Brother     Social History Social History   Tobacco Use  . Smoking status: Former Smoker    Packs/day: 1.00    Years: 32.00    Pack years: 32.00    Types: Cigarettes    Last attempt to quit: 02/03/1974    Years since quitting: 43.4  . Smokeless tobacco: Never Used  Substance Use Topics  . Alcohol use: Yes    Alcohol/week: 3.0 oz    Types: 5 Glasses of wine per week    Comment: 2017 WINE WITH DINNER   . Drug use: No     Allergies   Bee venom and Other   Review of Systems Review of Systems  All other systems reviewed and are negative.    Physical Exam Updated Vital Signs BP 134/65   Pulse 62   Resp (!) 22   Ht 5' (1.524 m)   Wt 79.8 kg (176 lb)   SpO2 96%   BMI 34.37 kg/m   Physical Exam  Constitutional: She appears well-developed and well-nourished. No distress.  HENT:  Head: Atraumatic.  Eyes: Conjunctivae are normal.  Neck: Neck supple.  Cardiovascular: Normal rate, regular rhythm and normal pulses.  Pulmonary/Chest: Effort normal and breath sounds normal. She has no decreased breath sounds. She has no wheezes. She has no rhonchi. She has no rales.  Neurological: She is alert.  Skin: Rash (Vesicular rash noted to right side of the chest and back following T3-T4 dermatomal pattern.) noted.  Psychiatric: She has a normal mood and affect.    Nursing note and vitals reviewed.    ED Treatments / Results  Labs (all labs ordered are listed, but only abnormal results are displayed) Labs Reviewed  BASIC METABOLIC PANEL - Abnormal; Notable for the following components:      Result Value   Potassium 3.3 (*)    Glucose, Bld 142 (*)    Creatinine, Ser 1.08 (*)    Calcium 8.6 (*)    GFR calc non Af Amer 46 (*)    GFR calc Af Amer 53 (*)    All other components within normal limits  PROTIME-INR - Abnormal; Notable for the following components:   Prothrombin Time 23.7 (*)    All other components within normal limits  BRAIN NATRIURETIC PEPTIDE - Abnormal; Notable for the following components:   B Natriuretic Peptide 288.2 (*)    All other components within normal limits  CBC  I-STAT TROPONIN, ED  I-STAT TROPONIN, ED    EKG None  ED ECG REPORT   Date: 06/29/2017  Rate: 66  Rhythm: atrial flutter  QRS Axis: left  Intervals: normal  ST/T Wave abnormalities: nonspecific ST changes  Conduction Disutrbances:left bundle branch block  Narrative Interpretation:   Old EKG Reviewed: unchanged  I have personally reviewed the EKG tracing and agree with the computerized printout as noted.    Radiology Dg Chest 2 View  Result Date: 06/29/2017 CLINICAL DATA:  Chest pain and shortness of breath EXAM: CHEST - 2 VIEW COMPARISON:  July 10, 2016 FINDINGS: There is slight right base atelectasis. There is no edema or consolidation. There is cardiomegaly with pulmonary vascularity within normal limits. There are foci of coronary artery calcification in the left anterior descending and circumflex regions. There is aortic atherosclerosis. Pacemaker leads are attached the right atrium and right ventricle. No adenopathy. No evident bone lesions. There is opacity either in or overlying the medial right  apex. No tracheal deviation. No pneumothorax. IMPRESSION: Suspect soft tissue opacity overlying the medial right apex. Etiology for this opacity  is uncertain. Potentially overlying subcutaneous tissue could cause this appearance. If there is not excessive subcutaneous tissue in this area on physical examination, chest CT, ideally with intravenous contrast, may be advisable to further assess this region. Slight right base atelectasis. No edema or consolidation. Stable cardiac silhouette. There is aortic atherosclerosis as well as multiple foci coronary artery calcification. Pacemaker leads are attached to the right atrium and right ventricle. Aortic Atherosclerosis (ICD10-I70.0). Electronically Signed   By: Lowella Grip III M.D.   On: 06/29/2017 12:43    Procedures Procedures (including critical care time)  Medications Ordered in ED Medications  acyclovir (ZOVIRAX) 200 MG capsule 400 mg (400 mg Oral Given 06/29/17 1343)     Initial Impression / Assessment and Plan / ED Course  I have reviewed the triage vital signs and the nursing notes.  Pertinent labs & imaging results that were available during my care of the patient were reviewed by me and considered in my medical decision making (see chart for details).     BP 128/82   Pulse (!) 40   Resp (!) 27   Ht 5' (1.524 m)   Wt 79.8 kg (176 lb)   SpO2 96%   BMI 34.37 kg/m  A flutter, normal respiratory rate on my examination.   Final Clinical Impressions(s) / ED Diagnoses   Final diagnoses:  Herpes zoster without complication  Atypical chest pain    ED Discharge Orders        Ordered    acyclovir (ZOVIRAX) 400 MG tablet  4 times daily     06/29/17 1534     1:11 PM Patient here with right-sided chest pain radiates to her back for the past several days.  Does have significant cardiac history including 7 stents.  She report resolution of her pain after taking 2 subungual nitroglycerin.  On exam, patient has a shingle rash noted to the right side of her chest which is likely the cause of her pain.  She however does have significant cardiac history.  Care discussed with  Dr. Tyrone Nine.  3:23 PM Pt currently sxs free.  Pt did report increasing stress level in the past 3 weeks caring for her alcoholic son.  She has prior hx of shingle.  Dr. Tyrone Nine have evaluated pt and felt she's stable for discharge as her cp is likely shingle and atypical of ACS.  Pt felt comfortable going home with treatment.  She will f/u promptly with her PCP and cardiology but understand to return if her sxs worsen or if she has any concerns.      Domenic Moras, PA-C 06/29/17 [place], DO 07/03/17 [phone]

## 2017-06-30 ENCOUNTER — Telehealth: Payer: Self-pay | Admitting: Interventional Cardiology

## 2017-06-30 NOTE — Telephone Encounter (Signed)
Patient calling and states that she was having pain in her R shoulder, arm, and jaw this morning when she woke up. She states that she had a little tightness in her chest. She states that this happened when she got up to feed her cat. She rated the pain 5/10. The patient states that she could also tell that she was in Afib. The patient also states that she felt nauseous. The patient states that she took NTG x 1 with relief. The patient denies having any pain at this time. Patient does not have any vitals to offer this morning but states that her HR is not high. Patient is compliant with current meds.   The patient was seen in the ER yesterday for the same symptoms and was found to have shingles. She was prescribed acyclovir. The shingles are in the same location of her discomfort. Instructed patient to continue the medicine for her shingles as her pain could be related to her shingles. Made patient aware that she can use her NTG q 5 minutes x3 doses in a 15 minute time frame as needed for chest pain. ER precautions reviewed with the patient. Made patient aware that I would forward to Dr. Irish Lack to make him aware. Patient verbalized understanding and thanked me for the call.

## 2017-06-30 NOTE — Telephone Encounter (Signed)
New Message    Patient went to the hospital yesterday thought she was having a heart event, when she got to the hospital they did testing and said that it was not heart , she was diagnosed with Shingles.  She is having lightheadedness, tightness in chest, pain in jaw - she took nitro and it seemed to help with these symptoms, should she continue taking the nitro or should she just take the medication that they gave her for the Shingles   Pt c/o of Chest Pain: STAT if CP now or developed within 24 hours  1. Are you having CP right now? Tightness   2. Are you experiencing any other symptoms (ex. SOB, nausea, vomiting, sweating)? Nausea, lightheadness, pain in jaw   3. How long have you been experiencing CP? Since yesterday, it went away when at hospital and they sent her home, it started again this morning when she woke up   4. Is your CP continuous or coming and going? Comes and goes   5. Have you taken Nitroglycerin? Yes  ?

## 2017-07-01 ENCOUNTER — Encounter (HOSPITAL_COMMUNITY): Payer: Self-pay

## 2017-07-01 NOTE — Telephone Encounter (Signed)
Pt calling and need to speak back to the nurse concerning her condition with her shingles. She has more problems going on,.

## 2017-07-01 NOTE — Telephone Encounter (Signed)
Returned call to patient. Patient states that her HR has been 100-1teens with BP 136/80. She states that she can feel pressure in her chest as her heart beats fast. Patient states that it is not pain and it is pressure as she has always had in the past when her HR gets too high. She denies any radiation today, SOB, or any other symptoms. She states that the discomfort from her shingles is still there but is starting to improve. Patient states that she can tell that she is in Afib and that her HR is high. Patient takes diltiazem 240 mg in the AM and 120 mg in the PM and she also takes metoprolol 100 mg in the AM and 75 mg in the PM. Discussed with Dr. [name] who states that the patient can increase metoprolol to 100 mg BID to see if that helps. Instructed patient to continue to monitor her symptoms and let us know if they worsened or did not improve. Patient verbalized understanding and thanked me for the call.

## 2017-07-03 ENCOUNTER — Encounter (HOSPITAL_COMMUNITY): Payer: Self-pay

## 2017-07-03 DIAGNOSIS — R000 Tachycardia, unspecified: Secondary | ICD-10-CM | POA: Diagnosis not present

## 2017-07-03 DIAGNOSIS — R0789 Other chest pain: Secondary | ICD-10-CM | POA: Diagnosis not present

## 2017-07-03 DIAGNOSIS — B029 Zoster without complications: Secondary | ICD-10-CM | POA: Diagnosis not present

## 2017-07-06 ENCOUNTER — Encounter (HOSPITAL_COMMUNITY): Payer: Self-pay | Attending: Interventional Cardiology

## 2017-07-06 ENCOUNTER — Telehealth (HOSPITAL_COMMUNITY): Payer: Self-pay | Admitting: Internal Medicine

## 2017-07-06 DIAGNOSIS — Z48812 Encounter for surgical aftercare following surgery on the circulatory system: Secondary | ICD-10-CM | POA: Insufficient documentation

## 2017-07-06 DIAGNOSIS — Z955 Presence of coronary angioplasty implant and graft: Secondary | ICD-10-CM | POA: Insufficient documentation

## 2017-07-06 DIAGNOSIS — I214 Non-ST elevation (NSTEMI) myocardial infarction: Secondary | ICD-10-CM | POA: Insufficient documentation

## 2017-07-08 ENCOUNTER — Encounter (HOSPITAL_COMMUNITY): Payer: Self-pay

## 2017-07-10 ENCOUNTER — Encounter (HOSPITAL_COMMUNITY): Payer: Self-pay

## 2017-07-13 ENCOUNTER — Encounter (HOSPITAL_COMMUNITY): Payer: Self-pay

## 2017-07-13 ENCOUNTER — Ambulatory Visit (INDEPENDENT_AMBULATORY_CARE_PROVIDER_SITE_OTHER): Payer: Medicare Other | Admitting: *Deleted

## 2017-07-13 DIAGNOSIS — I4891 Unspecified atrial fibrillation: Secondary | ICD-10-CM | POA: Diagnosis not present

## 2017-07-13 DIAGNOSIS — Z5181 Encounter for therapeutic drug level monitoring: Secondary | ICD-10-CM

## 2017-07-13 DIAGNOSIS — I482 Chronic atrial fibrillation: Secondary | ICD-10-CM | POA: Diagnosis not present

## 2017-07-13 DIAGNOSIS — I4821 Permanent atrial fibrillation: Secondary | ICD-10-CM

## 2017-07-13 LAB — POCT INR: INR: 2.3 (ref 2.0–3.0)

## 2017-07-13 NOTE — Patient Instructions (Signed)
Description   Continue on same dose of 2 mg daily except 1 mg on Mondays and Fridays. Keep dark greens consistent. Recheck INR in 6 weeks. Main #[phone]. [facility] # [phone]     

## 2017-07-15 ENCOUNTER — Encounter (HOSPITAL_COMMUNITY): Payer: Self-pay

## 2017-07-17 ENCOUNTER — Encounter (HOSPITAL_COMMUNITY): Payer: Self-pay

## 2017-07-19 ENCOUNTER — Other Ambulatory Visit: Payer: Self-pay | Admitting: Interventional Cardiology

## 2017-07-20 ENCOUNTER — Encounter (HOSPITAL_COMMUNITY): Payer: Self-pay

## 2017-07-22 ENCOUNTER — Encounter (HOSPITAL_COMMUNITY): Payer: Self-pay

## 2017-07-24 ENCOUNTER — Other Ambulatory Visit: Payer: Self-pay | Admitting: Internal Medicine

## 2017-07-24 ENCOUNTER — Encounter (HOSPITAL_COMMUNITY): Payer: Self-pay

## 2017-07-24 DIAGNOSIS — Z1231 Encounter for screening mammogram for malignant neoplasm of breast: Secondary | ICD-10-CM

## 2017-07-27 ENCOUNTER — Encounter (HOSPITAL_COMMUNITY): Payer: Self-pay

## 2017-07-29 ENCOUNTER — Encounter (HOSPITAL_COMMUNITY): Payer: Self-pay

## 2017-07-31 ENCOUNTER — Encounter (HOSPITAL_COMMUNITY): Payer: Self-pay

## 2017-07-31 DIAGNOSIS — F4323 Adjustment disorder with mixed anxiety and depressed mood: Secondary | ICD-10-CM | POA: Diagnosis not present

## 2017-08-03 ENCOUNTER — Telehealth (HOSPITAL_COMMUNITY): Payer: Self-pay | Admitting: Internal Medicine

## 2017-08-03 ENCOUNTER — Encounter (HOSPITAL_COMMUNITY): Payer: Self-pay | Attending: Interventional Cardiology

## 2017-08-03 DIAGNOSIS — M25572 Pain in left ankle and joints of left foot: Secondary | ICD-10-CM | POA: Insufficient documentation

## 2017-08-03 DIAGNOSIS — Z955 Presence of coronary angioplasty implant and graft: Secondary | ICD-10-CM | POA: Insufficient documentation

## 2017-08-03 DIAGNOSIS — I214 Non-ST elevation (NSTEMI) myocardial infarction: Secondary | ICD-10-CM | POA: Insufficient documentation

## 2017-08-03 DIAGNOSIS — Z48812 Encounter for surgical aftercare following surgery on the circulatory system: Secondary | ICD-10-CM | POA: Insufficient documentation

## 2017-08-03 NOTE — Progress Notes (Signed)
Cardiology Office Note   Date:  08/03/2017   ID:  Patty [name] Patty [name], DOB Apr 29, 1930, MRN [id number]  PCP:  Patty Carol, MD    No chief complaint on file.  CAD  Wt Readings from Last 3 Encounters:  06/29/17 176 lb (79.8 kg)  06/24/17 179 lb 8 oz (81.4 kg)  05/11/17 181 lb (82.1 kg)       History of Present Illness: Patty [name] is a 84 y.o. female  with history of CAD s/p multiple stents, chronic combined CHF, permanent atrial fib/flutter on Coumadin with tachy-brady syndrome s/p leadless PPM (with premature battery failure no longer active - being followed conservatively for now), probable CKD III per labs, granulosa cell carcinoma, HTN, HLD, LBBB, varicose veins, renal artery stenosis, PVD, diverticulitis, OSA who presents for f/u.  She has significant CAD with stenting x 2 to the LAD and x 1 to the LCx in 2000, rotational arthrectomy to proximal LCx and DES to LAD in 2014 and DES to LCx in 2014, along with DES to LAD for re-in-stent stenosis in 2015, and DES to ostial LAD in 2016. PVD is listed in chart but I cannot find further notes regarding this as LE doppler 2014 showed no evidence of significant LE disease   She was admittedin June 2018with exertional dyspnea and chest tightness and palpitations. She was found to be in rapid atrial fib/flutter and minimally elevated troponin. 2D Echo 07/11/16 showed moderate focal basal and mild concentric hypertrophy, EF 50-55%, no RWMA, septal dyssyngery c/w LBBB, mild AI, massively dilated LA, trivial TR, mild PR. Coumadin was held in prep for cath which was performed 07/14/16 showing 100% CTO of OM1 with L-L collaterals, 90% distal Cx, 100% distal PLOM, otherwise nonobstructive disease, normal LVEDP - unsuccessful PCI of the distal Cx due to inability to cross the lesion with a balloon. She was brought back 07/15/16 and underwent successful orbital atherectomy &DES to mid LCx. She was discharged on ASA, Plavix and Coumadin with plan to  discontinue ASA after 1 month.  She had an episode of Shingles around Memorial Day 2019. It was on the right side of her chest.  She was treated with acyclovir. She still has some itching. She had constipation and nausea.  She treated this with peptobismol.  She initially thought it was a heart episode, but then with the rash, realized it was shingles.   She still has some trouble sleeping.    She had a lot of fatigue with the shingles.  SHe had a lot of stress with her son who is an alcoholic.  THe chest sx improved over the course of her shingles treatment.  SHe did take some NTG with relief.  She has not needed NTG in the last few days.    BP has been controlled.  She thinks she is getting better form the shingles.   Denies :  Dizziness. Leg edema. Nitroglycerin use. Orthopnea. Paroxysmal nocturnal dyspnea. Shortness of breath. Syncope.   Rare palpitations.    Continuing to watch her abdominal tumors.  Following with Dr. Denman George.   Past Medical History:  Diagnosis Date  . Chronic anticoagulation - coumadin, CHADS2VASC=6 05/17/2015  . CKD (chronic kidney disease), stage III ([place])   . Combined systolic and diastolic heart failure ([place])   . Coronary artery disease    a. s/p multiple stents - stenting x 2 to the LAD and x 1 to the LCx in 2000, rotational arthrectomy to proximal LCx and DES to  LAD in 2014 and DES to LCx in 2014, along with DES to LAD for re-in-stent stenosis in 2015, and DES to ostial LAD in 2016. b. 07/2016 - orbital atherectomy & DES to mid LCx.  . Diverticulitis   . Granulosa cell carcinoma ([place])    abd; last episode was in 2009  . Hyperlipidemia   . Hypertension   . LBBB (left bundle branch block)       . Myocardial infarction ([place]) 2002  . Obesity   . OSA on CPAP   . Pacemaker failure    a. Prior leadless PPM with premature battery failure, being managed conservatively without replacement.  . Peripheral vascular disease ([place])   . Permanent atrial fibrillation  ([place]) 2013  . Renal artery stenosis ([place])   . Tachycardia-bradycardia syndrome ([place])    a. s/p leadless pacemaker ([name]) implanted by Dr [name]  . Varicose veins     Past Surgical History:  Procedure Laterality Date  . ABDOMINAL HYSTERECTOMY    . CARDIAC CATHETERIZATION  09/03/2007   EF 70%; Failed attempt at PCI to OM  . CARDIAC CATHETERIZATION  11/01/2003   EF 70%  . CARDIAC CATHETERIZATION N/A 12/14/2015   Procedure: Left Heart Cath and Coronary Angiography;  Surgeon: Burnell Blanks, MD;  Location: [place] [facility];  Service: Cardiovascular;  Laterality: N/A;  . CARDIOVERSION  12/31/2011   Procedure: CARDIOVERSION;  Surgeon: [name], MD;  Location: [facility] ENDOSCOPY;  Service: Cardiovascular;  Laterality: N/A;  . CARDIOVERSION N/A 12/31/2011   Procedure: CARDIOVERSION;  Surgeon: [name], MD;  Location: [facility] CATH LAB;  Service: Cardiovascular;  Laterality: N/A;  . CATARACT EXTRACTION, BILATERAL  2015  . CHOLECYSTECTOMY  1980's  . COLON SURGERY  2004   colectomy for diverticulosis  . CORONARY ANGIOPLASTY WITH STENT PLACEMENT  2000; 08/11/2012; 11/12/2012   3 + 2 LAD & CFX; 2nd CFX stent 11/12/2012  . CORONARY ATHERECTOMY N/A 07/15/2016   Procedure: Coronary Atherectomy;  Surgeon: Martinique, Peter M, MD;  Location: [place] [facility];  Service: Cardiovascular;  Laterality: N/A;  . CORONARY BALLOON ANGIOPLASTY N/A 07/14/2016   Procedure: Coronary Balloon Angioplasty;  Surgeon: Martinique, Peter M, MD;  Location: [place] [facility];  Service: Cardiovascular;  Laterality: N/A;  . CORONARY STENT INTERVENTION N/A 07/15/2016   Procedure: Coronary Stent Intervention;  Surgeon: Martinique, Peter M, MD;  Location: [place] [facility] LAB;  Service: Cardiovascular;  Laterality: N/A;  . FRACTIONAL FLOW RESERVE WIRE  10/07/2013   Procedure: [place];  Surgeon: [name], MD;  Location: [facility] CATH LAB;  Service: Cardiovascular;;  . granulosa tumor excision   2000; 2003; 2004; 2007   "all in my abdomen including small intestines, outside my ?uterus/etc" (08/11/2012)  . HERNIA REPAIR  2005   "laparoscopic"  . LEFT HEART CATH AND CORONARY ANGIOGRAPHY N/A 07/14/2016   Procedure: Left Heart Cath and Coronary Angiography;  Surgeon: Martinique, Peter M, MD;  Location: [place] [facility];  Service: Cardiovascular;  Laterality: N/A;  . LEFT HEART CATHETERIZATION WITH CORONARY ANGIOGRAM N/A 11/12/2012   Procedure: LEFT HEART CATHETERIZATION WITH CORONARY ANGIOGRAM;  Surgeon: [name], MD;  Location: [facility] CATH LAB;  Service: Cardiovascular;  Laterality: N/A;  . LEFT HEART CATHETERIZATION WITH CORONARY ANGIOGRAM N/A 10/07/2013   Procedure: LEFT HEART CATHETERIZATION WITH CORONARY ANGIOGRAM;  Surgeon: [name], MD;  Location: [facility] CATH LAB;  Service: Cardiovascular;  Laterality: N/A;  . LEFT HEART CATHETERIZATION WITH CORONARY ANGIOGRAM N/A 12/14/2013  Procedure: LEFT HEART CATHETERIZATION WITH CORONARY ANGIOGRAM;  Surgeon: Sinclair Grooms, MD;  Location: [facility] CATH LAB;  Service: Cardiovascular;  Laterality: N/A;  . LEFT HEART CATHETERIZATION WITH CORONARY ANGIOGRAM N/A 05/16/2014   Procedure: LEFT HEART CATHETERIZATION WITH CORONARY ANGIOGRAM;  Surgeon: Sherren Mocha, MD;  Location: [facility] CATH LAB;  Service: Cardiovascular;  Laterality: N/A;  . PERCUTANEOUS CORONARY INTERVENTION-BALLOON ONLY  08/04/2012   Procedure: PERCUTANEOUS CORONARY INTERVENTION-BALLOON ONLY;  Surgeon: [name], MD;  Location: [facility] CATH LAB;  Service: Cardiovascular;;  . PERCUTANEOUS CORONARY ROTOBLATOR INTERVENTION (PCI-R) N/A 08/11/2012   Procedure: PERCUTANEOUS CORONARY ROTOBLATOR INTERVENTION (PCI-R);  Surgeon: [name], MD;  Location: [facility] CATH LAB;  Service: Cardiovascular;  Laterality: N/A;  . PERCUTANEOUS CORONARY STENT INTERVENTION (PCI-S)  10/07/2013   Procedure: PERCUTANEOUS CORONARY STENT INTERVENTION (PCI-S);  Surgeon: [name], MD;  Location: [facility]  CATH LAB;  Service: Cardiovascular;;  . PERMANENT PACEMAKER INSERTION N/A 03/16/2012   [name] (SJM) leadless pacemaker (LEADLESS II STUDY PATEINT)  . SALIVARY GLAND SURGERY  2000's   "had a little lump removed; granulosa related; it was benign" (08/11/2012)  . TEE WITHOUT CARDIOVERSION  12/31/2011   Procedure: TRANSESOPHAGEAL ECHOCARDIOGRAM (TEE);  Surgeon: [name], MD;  Location: [place];  Service: Cardiovascular;  Laterality: N/A;  . VARICOSE VEIN SURGERY Bilateral 1977     Current Outpatient Medications  Medication Sig Dispense Refill  . acyclovir (ZOVIRAX) 400 MG tablet Take 1 tablet (400 mg total) by mouth 4 (four) times daily. 50 tablet 0  . atorvastatin (LIPITOR) 80 MG tablet TAKE 1 TABLET BY MOUTH EVERY MORNING 90 tablet 2  . beta carotene w/minerals (OCUVITE) tablet Take 1 tablet by mouth 2 (two) times daily.     . Calcium Carb-Cholecalciferol (CALTRATE 600+D) 600-800 MG-UNIT TABS Take 1 tablet by mouth every evening.     . clopidogrel (PLAVIX) 75 MG tablet TAKE 1 TABLET BY MOUTH EVERY DAY WITH BREAKFAST 90 tablet 3  . Coenzyme Q-10 100 MG capsule Take 200 mg by mouth daily.     Marland Kitchen diltiazem (CARDIZEM CD) 120 MG 24 hr capsule TAKE 1 CAPSULE (120 MG TOTAL) BY MOUTH AT BEDTIME. (Patient taking differently: Take 120-240 mg by mouth See admin instructions. Take 240mg  with breakfast and 120mg  at bedtime.) 90 capsule 3  . diltiazem (CARDIZEM CD) 240 MG 24 hr capsule Take 1 capsule (240 mg total) by mouth daily. With breakfast (Patient not taking: Reported on 06/29/2017) 90 capsule 3  . furosemide (LASIX) 40 MG tablet Take 80 mg by mouth daily.    . isosorbide mononitrate (IMDUR) 60 MG 24 hr tablet TAKE 1 TABLET BY MOUTH DAILY 30 tablet 9  . KLOR-CON M20 20 MEQ tablet TAKE 1 TABLET (20 MEQ TOTAL) BY MOUTH DAILY. 30 tablet 9  . metoprolol tartrate (LOPRESSOR) 100 MG tablet Take 1 tablet (100 mg total) by mouth every morning. Also takes (3) 25 mg tabs (75 mg) in the evening  (Patient not taking: Reported on 06/29/2017) 90 tablet 3  . metoprolol tartrate (LOPRESSOR) 25 MG tablet Take 3 tablets (75 mg total) by mouth every evening. (Patient taking differently: Take 75-100 mg by mouth See admin instructions. Take 100mg  every morning and 75mg  every night) 270 tablet 3  . Multiple Vitamin (MULTIVITAMIN) capsule Take 1 capsule by mouth daily.    . nitroGLYCERIN (NITROSTAT) 0.4 MG SL tablet PLACE 1 TABLET (0.4 MG TOTAL) UNDER THE TONGUE EVERY 5 (FIVE) MINUTES AS NEEDED. FOR CHEST PAIN 25 tablet 1  .  Omega-3 Fatty Acids (FISH OIL PO) Take 1,200 mg by mouth daily.     Marland Kitchen spironolactone (ALDACTONE) 25 MG tablet Take 25 mg by mouth daily as needed (fluid).  45 tablet 11  . warfarin (COUMADIN) 2 MG tablet TAKE AS DIRECTED BY COUMADIN CLINIC FOR 30 DAYS 30 tablet 3   No current facility-administered medications for this visit.     Allergies:   Bee venom; Other; Amiodarone; and Prednisone    Social History:  The patient  reports that she quit smoking about 43 years ago. Her smoking use included cigarettes. She has a 32.00 pack-year smoking history. She has never used smokeless tobacco. She reports that she drinks about 3.0 oz of alcohol per week. She reports that she does not use drugs.   Family History:  The patient's family history includes Diabetes in her brother and father; Heart attack in her brother and father; Hypertension in her brother and father; Kidney failure in her brother; Stroke in her mother.    ROS:  Please see the history of present illness.   Otherwise, review of systems are positive for fatigue.   All other systems are reviewed and negative.    PHYSICAL EXAM: VS:  There were no vitals taken for this visit. , BMI There is no height or weight on file to calculate BMI. GEN: Well nourished, well developed, in no acute distress  HEENT: normal  Neck: no JVD, carotid bruits, or masses Cardiac: RRR- likely paced rhythm; no murmurs, rubs, or gallops,no edema    Respiratory:  clear to auscultation bilaterally, normal work of breathing GI: soft, nontender, nondistended, + BS MS: no deformity or atrophy  Skin: warm and dry, no rash Neuro:  Strength and sensation are intact Psych: euthymic mood, full affect   Recent Labs: 06/29/2017: B Natriuretic Peptide 288.2; BUN 16; Creatinine, Ser 1.08; Hemoglobin 12.7; Platelets 200; Potassium 3.3; Sodium 141   Lipid Panel    Component Value Date/Time   CHOL 107 07/11/2016 0926   CHOL 160 02/13/2016 0855   TRIG 46 07/11/2016 0926   HDL 38 (L) 07/11/2016 0926   HDL 68 02/13/2016 0855   CHOLHDL 2.8 07/11/2016 0926   VLDL 9 07/11/2016 0926   LDLCALC 60 07/11/2016 0926   LDLCALC 75 02/13/2016 0855     Other studies Reviewed: Additional studies/ records that were reviewed today with results demonstrating: cath results reviewed.   ASSESSMENT AND PLAN:  1. CAD: Feeling better as time passes after the shingles.  She can go back to rehab.  Start slowly and build exercise tolerance. Fatigue after shingles.  Should improve with more exercise.   2. AFib: Rate controlled.  Coumadin for stroke prevention.  3. Anticoagulated: No bleeding issues.  May be considering chemo in September if her abdominal cancer advances. 4. HTN: Well controlled.  Continue current meds.   Current medicines are reviewed at length with the patient today.  The patient concerns regarding her medicines were addressed.  The following changes have been made:  No change  Labs/ tests ordered today include:  No orders of the defined types were placed in this encounter.   Recommend 150 minutes/week of aerobic exercise Low fat, low carb, high fiber diet recommended  Disposition:   FU in 1 year   Signed, Larae Grooms, MD  08/03/2017 12:22 PM    [facility] [address], [place], [place]  [address] Phone: [phone]; Fax: [phone]

## 2017-08-04 ENCOUNTER — Encounter: Payer: Self-pay | Admitting: Interventional Cardiology

## 2017-08-04 ENCOUNTER — Ambulatory Visit (INDEPENDENT_AMBULATORY_CARE_PROVIDER_SITE_OTHER): Payer: Medicare Other | Admitting: Interventional Cardiology

## 2017-08-04 VITALS — BP 122/64 | HR 61 | Ht 60.0 in | Wt 177.8 lb

## 2017-08-04 DIAGNOSIS — I10 Essential (primary) hypertension: Secondary | ICD-10-CM

## 2017-08-04 DIAGNOSIS — Z7901 Long term (current) use of anticoagulants: Secondary | ICD-10-CM | POA: Diagnosis not present

## 2017-08-04 DIAGNOSIS — I2510 Atherosclerotic heart disease of native coronary artery without angina pectoris: Secondary | ICD-10-CM

## 2017-08-04 DIAGNOSIS — I4821 Permanent atrial fibrillation: Secondary | ICD-10-CM

## 2017-08-04 DIAGNOSIS — I25118 Atherosclerotic heart disease of native coronary artery with other forms of angina pectoris: Secondary | ICD-10-CM | POA: Diagnosis not present

## 2017-08-04 DIAGNOSIS — I482 Chronic atrial fibrillation: Secondary | ICD-10-CM

## 2017-08-04 NOTE — Patient Instructions (Signed)
Medication Instructions:  Your physician recommends that you continue on your current medications as directed. Please refer to the Current Medication list given to you today.   Labwork: Your physician recommends that you return for a FASTING lipid profile and liver function panel  Testing/Procedures: None ordered  Follow-Up: Your physician wants you to follow-up in: 6 months with Dr. Irish Lack. You will receive a reminder letter in the mail two months in advance. If you don't receive a letter, please call our office to schedule the follow-up appointment.   Any Other Special Instructions Will Be Listed Below (If Applicable).     If you need a refill on your cardiac medications before your next appointment, please call your pharmacy.

## 2017-08-05 ENCOUNTER — Other Ambulatory Visit: Payer: Self-pay | Admitting: Interventional Cardiology

## 2017-08-05 ENCOUNTER — Encounter (HOSPITAL_COMMUNITY): Payer: Self-pay

## 2017-08-07 ENCOUNTER — Encounter (HOSPITAL_COMMUNITY): Payer: Self-pay

## 2017-08-07 ENCOUNTER — Ambulatory Visit: Payer: Medicare Other

## 2017-08-10 ENCOUNTER — Telehealth: Payer: Self-pay | Admitting: Cardiology

## 2017-08-10 ENCOUNTER — Ambulatory Visit (INDEPENDENT_AMBULATORY_CARE_PROVIDER_SITE_OTHER): Payer: Medicare Other | Admitting: *Deleted

## 2017-08-10 ENCOUNTER — Encounter (HOSPITAL_COMMUNITY): Payer: Self-pay

## 2017-08-10 DIAGNOSIS — I495 Sick sinus syndrome: Secondary | ICD-10-CM

## 2017-08-10 NOTE — Telephone Encounter (Signed)
Attempted to confirm remote transmission with pt. No answer and was unable to leave a message.   

## 2017-08-11 NOTE — Progress Notes (Signed)
Remote pacemaker transmission.   

## 2017-08-12 ENCOUNTER — Encounter (HOSPITAL_COMMUNITY): Payer: Self-pay

## 2017-08-13 ENCOUNTER — Ambulatory Visit: Payer: Medicare Other

## 2017-08-14 ENCOUNTER — Ambulatory Visit: Payer: Medicare Other

## 2017-08-14 ENCOUNTER — Encounter (HOSPITAL_COMMUNITY): Payer: Self-pay

## 2017-08-14 DIAGNOSIS — I5023 Acute on chronic systolic (congestive) heart failure: Secondary | ICD-10-CM | POA: Diagnosis not present

## 2017-08-14 DIAGNOSIS — E782 Mixed hyperlipidemia: Secondary | ICD-10-CM | POA: Diagnosis not present

## 2017-08-14 DIAGNOSIS — F4323 Adjustment disorder with mixed anxiety and depressed mood: Secondary | ICD-10-CM | POA: Diagnosis not present

## 2017-08-14 DIAGNOSIS — I214 Non-ST elevation (NSTEMI) myocardial infarction: Secondary | ICD-10-CM | POA: Diagnosis not present

## 2017-08-14 DIAGNOSIS — I252 Old myocardial infarction: Secondary | ICD-10-CM | POA: Diagnosis not present

## 2017-08-14 DIAGNOSIS — I481 Persistent atrial fibrillation: Secondary | ICD-10-CM | POA: Diagnosis not present

## 2017-08-14 DIAGNOSIS — I5042 Chronic combined systolic (congestive) and diastolic (congestive) heart failure: Secondary | ICD-10-CM | POA: Diagnosis not present

## 2017-08-14 DIAGNOSIS — N183 Chronic kidney disease, stage 3 (moderate): Secondary | ICD-10-CM | POA: Diagnosis not present

## 2017-08-14 DIAGNOSIS — I10 Essential (primary) hypertension: Secondary | ICD-10-CM | POA: Diagnosis not present

## 2017-08-14 DIAGNOSIS — I2510 Atherosclerotic heart disease of native coronary artery without angina pectoris: Secondary | ICD-10-CM | POA: Diagnosis not present

## 2017-08-15 ENCOUNTER — Other Ambulatory Visit (HOSPITAL_COMMUNITY): Payer: Self-pay | Admitting: Physician Assistant

## 2017-08-16 LAB — CUP PACEART REMOTE DEVICE CHECK
Battery Voltage: 3.01 V
Brady Statistic AP VP Percent: 1 %
Brady Statistic AP VS Percent: 37 %
Brady Statistic AS VS Percent: 2.7 %
Brady Statistic RA Percent Paced: 1.4 %
Brady Statistic RV Percent Paced: 40 %
Implantable Lead Implant Date: 20181224
Implantable Lead Location: 753859
Implantable Lead Location: 753860
Lead Channel Impedance Value: 550 Ohm
Lead Channel Pacing Threshold Amplitude: 0.75 V
Lead Channel Pacing Threshold Amplitude: 0.75 V
Lead Channel Pacing Threshold Pulse Width: 0.4 ms
Lead Channel Sensing Intrinsic Amplitude: 5 mV
Lead Channel Setting Pacing Amplitude: 2.5 V
Lead Channel Setting Pacing Pulse Width: 0.4 ms
Lead Channel Setting Sensing Sensitivity: 2 mV
MDC IDC LEAD IMPLANT DT: 20181224
MDC IDC MSMT BATTERY REMAINING LONGEVITY: 124 mo
MDC IDC MSMT BATTERY REMAINING PERCENTAGE: 95.5 %
MDC IDC MSMT LEADCHNL RA IMPEDANCE VALUE: 730 Ohm
MDC IDC MSMT LEADCHNL RA PACING THRESHOLD PULSEWIDTH: 0.4 ms
MDC IDC MSMT LEADCHNL RV SENSING INTR AMPL: 7.3 mV
MDC IDC PG IMPLANT DT: 20181224
MDC IDC PG SERIAL: 8968200
MDC IDC SESS DTM: 20190709024646
MDC IDC SET LEADCHNL RA PACING AMPLITUDE: 2 V
MDC IDC STAT BRADY AS VP PERCENT: 59 %

## 2017-08-17 ENCOUNTER — Encounter (HOSPITAL_COMMUNITY): Payer: Self-pay

## 2017-08-17 DIAGNOSIS — W5501XA Bitten by cat, initial encounter: Secondary | ICD-10-CM | POA: Diagnosis not present

## 2017-08-17 DIAGNOSIS — S81801A Unspecified open wound, right lower leg, initial encounter: Secondary | ICD-10-CM | POA: Diagnosis not present

## 2017-08-18 ENCOUNTER — Telehealth: Payer: Self-pay | Admitting: *Deleted

## 2017-08-18 NOTE — Telephone Encounter (Signed)
Pt called to report that she was prescribed Amoxicillin/clavulanic on yesterday. Advised pt that the med is safe to take with Coumadin and she verbalized understanding. Advised pt to call with any new medication and gave her the new number in the clinic.

## 2017-08-19 ENCOUNTER — Encounter (HOSPITAL_COMMUNITY): Payer: Self-pay

## 2017-08-21 ENCOUNTER — Encounter (HOSPITAL_COMMUNITY): Payer: Self-pay

## 2017-08-24 ENCOUNTER — Encounter (HOSPITAL_COMMUNITY): Payer: Self-pay

## 2017-08-25 ENCOUNTER — Ambulatory Visit
Admission: RE | Admit: 2017-08-25 | Discharge: 2017-08-25 | Disposition: A | Discharge status: Home / Self Care | Payer: Medicare Other | Source: Ambulatory Visit | Attending: Internal Medicine | Admitting: Internal Medicine

## 2017-08-25 DIAGNOSIS — Z1231 Encounter for screening mammogram for malignant neoplasm of breast: Secondary | ICD-10-CM

## 2017-08-26 ENCOUNTER — Encounter (HOSPITAL_COMMUNITY): Payer: Self-pay

## 2017-08-26 ENCOUNTER — Ambulatory Visit (INDEPENDENT_AMBULATORY_CARE_PROVIDER_SITE_OTHER): Payer: Medicare Other | Admitting: *Deleted

## 2017-08-26 DIAGNOSIS — Z5181 Encounter for therapeutic drug level monitoring: Secondary | ICD-10-CM | POA: Diagnosis not present

## 2017-08-26 DIAGNOSIS — I4891 Unspecified atrial fibrillation: Secondary | ICD-10-CM

## 2017-08-26 LAB — POCT INR: INR: 2 (ref 2.0–3.0)

## 2017-08-26 NOTE — Patient Instructions (Signed)
Description   Continue on same dose of 2 mg daily except 1 mg on Mondays and Fridays. Keep dark greens consistent. Recheck INR in 6 weeks. Main [phone]. Coumadin Clinic # [phone]

## 2017-08-28 ENCOUNTER — Encounter (HOSPITAL_COMMUNITY): Payer: Self-pay

## 2017-08-30 ENCOUNTER — Other Ambulatory Visit: Payer: Self-pay | Admitting: Internal Medicine

## 2017-08-31 ENCOUNTER — Encounter (HOSPITAL_COMMUNITY): Payer: Self-pay

## 2017-09-02 ENCOUNTER — Encounter (HOSPITAL_COMMUNITY): Payer: Self-pay

## 2017-09-03 ENCOUNTER — Other Ambulatory Visit: Payer: Self-pay | Admitting: Interventional Cardiology

## 2017-09-03 ENCOUNTER — Encounter: Payer: Self-pay | Admitting: Nurse Practitioner

## 2017-09-03 ENCOUNTER — Other Ambulatory Visit: Payer: Medicare Other

## 2017-09-03 ENCOUNTER — Ambulatory Visit (INDEPENDENT_AMBULATORY_CARE_PROVIDER_SITE_OTHER): Payer: Medicare Other | Admitting: Nurse Practitioner

## 2017-09-03 VITALS — BP 138/70 | HR 61 | Ht 60.0 in | Wt 175.8 lb

## 2017-09-03 DIAGNOSIS — G4733 Obstructive sleep apnea (adult) (pediatric): Secondary | ICD-10-CM

## 2017-09-03 DIAGNOSIS — I2510 Atherosclerotic heart disease of native coronary artery without angina pectoris: Secondary | ICD-10-CM | POA: Diagnosis not present

## 2017-09-03 DIAGNOSIS — I4821 Permanent atrial fibrillation: Secondary | ICD-10-CM

## 2017-09-03 DIAGNOSIS — R001 Bradycardia, unspecified: Secondary | ICD-10-CM

## 2017-09-03 DIAGNOSIS — I481 Persistent atrial fibrillation: Secondary | ICD-10-CM

## 2017-09-03 DIAGNOSIS — Z9989 Dependence on other enabling machines and devices: Secondary | ICD-10-CM

## 2017-09-03 DIAGNOSIS — I10 Essential (primary) hypertension: Secondary | ICD-10-CM | POA: Diagnosis not present

## 2017-09-03 DIAGNOSIS — I4819 Other persistent atrial fibrillation: Secondary | ICD-10-CM

## 2017-09-03 LAB — CUP PACEART INCLINIC DEVICE CHECK
Implantable Lead Implant Date: 20181224
Implantable Lead Implant Date: 20181224
Implantable Lead Location: 753860
Implantable Pulse Generator Implant Date: 20181224
MDC IDC LEAD LOCATION: 753859
MDC IDC SESS DTM: 20190801103535
Pulse Gen Serial Number: 8968200

## 2017-09-03 NOTE — Patient Instructions (Addendum)
Medication Instructions:   Your physician recommends that you continue on your current medications as directed. Please refer to the Current Medication list given to you today.   If you need a refill on your cardiac medications before your next appointment, please call your pharmacy.  Labwork: NONE ORDERED  TODAY    Testing/Procedures: NONE ORDERED  TODAY    Follow-Up: Your physician wants you to follow-up in:  [place] will receive a reminder letter in the mail two months in advance. If you don't receive a letter, please call our office to schedule the follow-up appointment.     Remote monitoring is used to monitor your Pacemaker of ICD from home. This monitoring reduces the number of office visits required to check your device to one time per year. It allows [place] to keep an eye on the functioning of your device to ensure it is working properly. You are scheduled for a device check from home on . 11-09-17 You may send your transmission at any time that day. If you have a wireless device, the transmission will be sent automatically. After your physician reviews your transmission, you will receive a postcard with your next transmission date.     Any Other Special Instructions Will Be Listed Below (If Applicable).

## 2017-09-03 NOTE — Progress Notes (Signed)
Electrophysiology Office Note Date: 09/03/2017  ID:  Patty [name], DOB 12-Mar-1930, MRN [id number]  PCP: [name], MD Primary Cardiologist: Irish Lack Electrophysiologist: Allred  CC: Pacemaker follow-up  Patty [name] is a 84 y.o. female seen today for Dr [name].  She presents today for routine electrophysiology followup.  Since last being seen in our clinic, the patient reports doing reasonably well.  She is still recovering from shingles. She also had her bank accounts cleaned out by fraud that has caused increased stress.   She denies chest pain (above baseline),  dyspnea, PND, orthopnea, nausea, vomiting, dizziness, syncope, edema, weight gain, or early satiety. She is hoping to get back to cardiac rehab soon.   Device History: STJ leadless PPM implanted 2014 for tachy/brady (early battery depletion); STJ dual chamber PPM implanted 2018   Past Medical History:  Diagnosis Date  . Chronic anticoagulation - coumadin, CHADS2VASC=6 05/17/2015  . CKD (chronic kidney disease), stage III ([place])   . Combined systolic and diastolic heart failure ([place])   . Coronary artery disease    a. s/p multiple stents - stenting x 2 to the LAD and x 1 to the LCx in 2000, rotational arthrectomy to proximal LCx and DES to LAD in 2014 and DES to LCx in 2014, along with DES to LAD for re-in-stent stenosis in 2015, and DES to ostial LAD in 2016. b. 07/2016 - orbital atherectomy & DES to mid LCx.  . Diverticulitis   . Granulosa cell carcinoma ([place])    abd; last episode was in 2009  . Hyperlipidemia   . Hypertension   . LBBB (left bundle branch block)       . Myocardial infarction ([place]) 2002  . Obesity   . OSA on CPAP   . Pacemaker failure    a. Prior leadless PPM with premature battery failure, being managed conservatively without replacement.  . Peripheral vascular disease ([place])   . Permanent atrial fibrillation ([place]) 2013  . Renal artery stenosis ([place])   . Tachycardia-bradycardia syndrome  ([place])    a. s/p leadless pacemaker ([name]) implanted by Dr [name]  . Varicose veins    Past Surgical History:  Procedure Laterality Date  . ABDOMINAL HYSTERECTOMY    . CARDIAC CATHETERIZATION  09/03/2007   EF 70%; Failed attempt at PCI to OM  . CARDIAC CATHETERIZATION  11/01/2003   EF 70%  . CARDIAC CATHETERIZATION N/A 12/14/2015   Procedure: Left Heart Cath and Coronary Angiography;  Surgeon: Burnell Blanks, MD;  Location: [place] [facility];  Service: Cardiovascular;  Laterality: N/A;  . CARDIOVERSION  12/31/2011   Procedure: CARDIOVERSION;  Surgeon: [name], MD;  Location: [facility] ENDOSCOPY;  Service: Cardiovascular;  Laterality: N/A;  . CARDIOVERSION N/A 12/31/2011   Procedure: CARDIOVERSION;  Surgeon: [name], MD;  Location: [facility] CATH LAB;  Service: Cardiovascular;  Laterality: N/A;  . CATARACT EXTRACTION, BILATERAL  2015  . CHOLECYSTECTOMY  1980's  . COLON SURGERY  2004   colectomy for diverticulosis  . CORONARY ANGIOPLASTY WITH STENT PLACEMENT  2000; 08/11/2012; 11/12/2012   3 + 2 LAD & CFX; 2nd CFX stent 11/12/2012  . CORONARY ATHERECTOMY N/A 07/15/2016   Procedure: Coronary Atherectomy;  Surgeon: Martinique, Peter M, MD;  Location: [place] [facility] LAB;  Service: Cardiovascular;  Laterality: N/A;  . CORONARY BALLOON ANGIOPLASTY N/A 07/14/2016   Procedure: Coronary Balloon Angioplasty;  Surgeon: Martinique, Peter M, MD;  Location: [place] [facility] LAB;  Service: Cardiovascular;  Laterality: N/A;  .  CORONARY STENT INTERVENTION N/A 07/15/2016   Procedure: Coronary Stent Intervention;  Surgeon: Martinique, Peter M, MD;  Location: [place] [facility] LAB;  Service: Cardiovascular;  Laterality: N/A;  . FRACTIONAL FLOW RESERVE WIRE  10/07/2013   Procedure: [place];  Surgeon: [name], MD;  Location: [facility] CATH LAB;  Service: Cardiovascular;;  . granulosa tumor excision  2000; 2003; 2004; 2007   "all in my abdomen including small intestines, outside my  ?uterus/etc" (08/11/2012)  . HERNIA REPAIR  2005   "laparoscopic"  . LEFT HEART CATH AND CORONARY ANGIOGRAPHY N/A 07/14/2016   Procedure: Left Heart Cath and Coronary Angiography;  Surgeon: Martinique, Peter M, MD;  Location: [place] [facility];  Service: Cardiovascular;  Laterality: N/A;  . LEFT HEART CATHETERIZATION WITH CORONARY ANGIOGRAM N/A 11/12/2012   Procedure: LEFT HEART CATHETERIZATION WITH CORONARY ANGIOGRAM;  Surgeon: [name], MD;  Location: [facility] CATH LAB;  Service: Cardiovascular;  Laterality: N/A;  . LEFT HEART CATHETERIZATION WITH CORONARY ANGIOGRAM N/A 10/07/2013   Procedure: LEFT HEART CATHETERIZATION WITH CORONARY ANGIOGRAM;  Surgeon: [name], MD;  Location: [facility] CATH LAB;  Service: Cardiovascular;  Laterality: N/A;  . LEFT HEART CATHETERIZATION WITH CORONARY ANGIOGRAM N/A 12/14/2013   Procedure: LEFT HEART CATHETERIZATION WITH CORONARY ANGIOGRAM;  Surgeon: Sinclair Grooms, MD;  Location: [facility] CATH LAB;  Service: Cardiovascular;  Laterality: N/A;  . LEFT HEART CATHETERIZATION WITH CORONARY ANGIOGRAM N/A 05/16/2014   Procedure: LEFT HEART CATHETERIZATION WITH CORONARY ANGIOGRAM;  Surgeon: Sherren Mocha, MD;  Location: [facility] CATH LAB;  Service: Cardiovascular;  Laterality: N/A;  . PERCUTANEOUS CORONARY INTERVENTION-BALLOON ONLY  08/04/2012   Procedure: PERCUTANEOUS CORONARY INTERVENTION-BALLOON ONLY;  Surgeon: [name], MD;  Location: [facility] CATH LAB;  Service: Cardiovascular;;  . PERCUTANEOUS CORONARY ROTOBLATOR INTERVENTION (PCI-R) N/A 08/11/2012   Procedure: PERCUTANEOUS CORONARY ROTOBLATOR INTERVENTION (PCI-R);  Surgeon: [name], MD;  Location: [facility] CATH LAB;  Service: Cardiovascular;  Laterality: N/A;  . PERCUTANEOUS CORONARY STENT INTERVENTION (PCI-S)  10/07/2013   Procedure: PERCUTANEOUS CORONARY STENT INTERVENTION (PCI-S);  Surgeon: [name], MD;  Location: [facility] CATH LAB;  Service: Cardiovascular;;  . PERMANENT PACEMAKER INSERTION N/A 03/16/2012     [name] (SJM) leadless pacemaker (LEADLESS II STUDY PATEINT)  . SALIVARY GLAND SURGERY  2000's   "had a little lump removed; granulosa related; it was benign" (08/11/2012)  . TEE WITHOUT CARDIOVERSION  12/31/2011   Procedure: TRANSESOPHAGEAL ECHOCARDIOGRAM (TEE);  Surgeon: [name], MD;  Location: [place];  Service: Cardiovascular;  Laterality: N/A;  . VARICOSE VEIN SURGERY Bilateral 1977    Current Outpatient Medications  Medication Sig Dispense Refill  . acyclovir (ZOVIRAX) 400 MG tablet Take 1 tablet (400 mg total) by mouth 4 (four) times daily. 50 tablet 0  . atorvastatin (LIPITOR) 80 MG tablet TAKE 1 TABLET BY MOUTH EVERY MORNING 90 tablet 2  . beta carotene w/minerals (OCUVITE) tablet Take 1 tablet by mouth 2 (two) times daily.     . Calcium Carb-Cholecalciferol (CALTRATE 600+D) 600-800 MG-UNIT TABS Take 1 tablet by mouth every evening.     . clopidogrel (PLAVIX) 75 MG tablet TAKE 1 TABLET BY MOUTH EVERY DAY WITH BREAKFAST 90 tablet 3  . Coenzyme Q-10 100 MG capsule Take 200 mg by mouth daily.     Marland Kitchen diltiazem (CARDIZEM CD) 120 MG 24 hr capsule TAKE ONE CAPSULE BY MOUTH AT BEDTIME 90 capsule 3  . diltiazem (CARDIZEM CD) 240 MG 24 hr capsule Take 1 capsule (240 mg total)  by mouth daily. With breakfast 90 capsule 3  . furosemide (LASIX) 40 MG tablet Take 80 mg by mouth daily.    . isosorbide mononitrate (IMDUR) 60 MG 24 hr tablet TAKE 1 TABLET BY MOUTH DAILY 30 tablet 9  . KLOR-CON M20 20 MEQ tablet TAKE 1 TABLET BY MOUTH EVERY DAY 90 tablet 3  . metoprolol tartrate (LOPRESSOR) 100 MG tablet Take 1 tablet (100 mg total) by mouth every morning. Also takes (3) 25 mg tabs (75 mg) in the evening 90 tablet 3  . Multiple Vitamin (MULTIVITAMIN) capsule Take 1 capsule by mouth daily.    . nitroGLYCERIN (NITROSTAT) 0.4 MG SL tablet PLACE 1 TABLET (0.4 MG TOTAL) UNDER THE TONGUE EVERY 5 (FIVE) MINUTES AS NEEDED. FOR CHEST PAIN 25 tablet 6  . Omega-3 Fatty Acids (FISH OIL PO) Take  1,200 mg by mouth daily.     Marland Kitchen spironolactone (ALDACTONE) 25 MG tablet Take 25 mg by mouth as needed (swelling).    . warfarin (COUMADIN) 2 MG tablet TAKE AS DIRECTED BY COUMADIN CLINIC FOR 30 DAYS 30 tablet 3   No current facility-administered medications for this visit.     Allergies:   Bee venom; Other; Amiodarone; and Prednisone   Social History: Social History   Socioeconomic History  . Marital status: Widowed    Spouse name: Not on file  . Number of children: 4  . Years of education: Not on file  . Highest education level: Not on file  Occupational History  . Occupation: Retired Nurse, learning disability estate  Social Needs  . Financial resource strain: Not on file  . Food insecurity:    Worry: Not on file    Inability: Not on file  . Transportation needs:    Medical: Not on file    Non-medical: Not on file  Tobacco Use  . Smoking status: Former Smoker    Packs/day: 1.00    Years: 32.00    Pack years: 32.00    Types: Cigarettes    Last attempt to quit: 02/03/1974    Years since quitting: 43.6  . Smokeless tobacco: Never Used  Substance and Sexual Activity  . Alcohol use: Yes    Alcohol/week: 3.0 oz    Types: 5 Glasses of wine per week    Comment: 2017 WINE WITH DINNER   . Drug use: No  . Sexual activity: Never  Lifestyle  . Physical activity:    Days per week: Not on file    Minutes per session: Not on file  . Stress: Not on file  Relationships  . Social connections:    Talks on phone: Not on file    Gets together: Not on file    Attends religious service: Not on file    Active member of club or organization: Not on file    Attends meetings of clubs or organizations: Not on file    Relationship status: Not on file  . Intimate partner violence:    Fear of current or ex partner: Not on file    Emotionally abused: Not on file    Physically abused: Not on file    Forced sexual activity: Not on file  Other Topics Concern  . Not on file  Social History Narrative     Lives with family.    Family History: Family History  Problem Relation Age of Onset  . Stroke Mother   . Heart attack Father   . Diabetes Father   . Hypertension Father   .  Heart attack Brother   . Diabetes Brother   . Hypertension Brother   . Kidney failure Brother      Review of Systems: All other systems reviewed and are otherwise negative except as noted above.   Physical Exam: VS:  BP 138/70   Pulse 61   Ht 5' (1.524 m)   Wt 175 lb 12.8 oz (79.7 kg)   SpO2 96%   BMI 34.33 kg/m  , BMI Body mass index is 34.33 kg/m.  GEN- The patient is elderly appearing, alert and oriented x 3 today.   HEENT: normocephalic, atraumatic; sclera clear, conjunctiva pink; hearing intact; oropharynx clear; neck supple  Lungs- Clear to ausculation bilaterally, normal work of breathing.  No wheezes, rales, rhonchi Heart- Regular rate and rhythm (paced) GI- soft, non-tender, non-distended, bowel sounds present  Extremities- no clubbing, cyanosis, or edema  MS- no significant deformity or atrophy Skin- warm and dry, no rash or lesion; PPM pocket well healed Psych- euthymic mood, full affect Neuro- strength and sensation are intact  PPM Interrogation- reviewed in detail today,  See PACEART report  EKG:  EKG is not ordered today.  Recent Labs: 06/29/2017: B Natriuretic Peptide 288.2; BUN 16; Creatinine, Ser 1.08; Hemoglobin 12.7; Platelets 200; Potassium 3.3; Sodium 141   Wt Readings from Last 3 Encounters:  09/03/17 175 lb 12.8 oz (79.7 kg)  08/04/17 177 lb 12.8 oz (80.6 kg)  06/29/17 176 lb (79.8 kg)     Other studies Reviewed: Additional studies/ records that were reviewed today include: Dr [name] and Dr Hassell Done notes   Assessment and Plan:  1.  Symptomatic sinus bradycardia Normal PPM function See Pace Art report No changes today  2.  Persistent atrial fibrillation Burden by device interrogation 96% V rates controlled Continue Warfarin for CHADS2VASC of  5 Metoprolol reduced at last office visit 2/2 fatigue  3.  HTN Stable No change required today  4.  OSA She is compliant with CPAP   5.  CAD No recent ischemic symptoms Continue medical therapy   Current medicines are reviewed at length with the patient today.   The patient does not have concerns regarding her medicines.  The following changes were made today:  none  Labs/ tests ordered today include: none Orders Placed This Encounter  Procedures  . CUP PACEART INCLINIC DEVICE CHECK     Disposition:   Follow up with Merlin, me in 6 months      Signed, Chanetta Marshall, NP 09/03/2017 11:30 AM  [facility] [place] [id number] [phone] (office) [phone] (fax)

## 2017-09-04 ENCOUNTER — Encounter (HOSPITAL_COMMUNITY): Payer: Self-pay

## 2017-09-04 DIAGNOSIS — I214 Non-ST elevation (NSTEMI) myocardial infarction: Secondary | ICD-10-CM | POA: Insufficient documentation

## 2017-09-04 DIAGNOSIS — Z955 Presence of coronary angioplasty implant and graft: Secondary | ICD-10-CM | POA: Insufficient documentation

## 2017-09-04 DIAGNOSIS — Z48812 Encounter for surgical aftercare following surgery on the circulatory system: Secondary | ICD-10-CM | POA: Insufficient documentation

## 2017-09-04 DIAGNOSIS — F4323 Adjustment disorder with mixed anxiety and depressed mood: Secondary | ICD-10-CM | POA: Diagnosis not present

## 2017-09-07 ENCOUNTER — Other Ambulatory Visit: Payer: Medicare Other

## 2017-09-07 ENCOUNTER — Encounter (HOSPITAL_COMMUNITY): Payer: Self-pay

## 2017-09-09 ENCOUNTER — Encounter (HOSPITAL_COMMUNITY): Payer: Self-pay

## 2017-09-10 DIAGNOSIS — F4323 Adjustment disorder with mixed anxiety and depressed mood: Secondary | ICD-10-CM | POA: Diagnosis not present

## 2017-09-11 ENCOUNTER — Encounter (HOSPITAL_COMMUNITY): Payer: Self-pay

## 2017-09-11 DIAGNOSIS — Z8619 Personal history of other infectious and parasitic diseases: Secondary | ICD-10-CM | POA: Diagnosis not present

## 2017-09-14 ENCOUNTER — Encounter (HOSPITAL_COMMUNITY)
Admission: RE | Admit: 2017-09-14 | Discharge: 2017-09-14 | Disposition: A | Discharge status: Home / Self Care | Payer: Self-pay | Source: Ambulatory Visit | Attending: Interventional Cardiology | Admitting: Interventional Cardiology

## 2017-09-16 ENCOUNTER — Encounter (HOSPITAL_COMMUNITY): Payer: Self-pay

## 2017-09-18 ENCOUNTER — Encounter (HOSPITAL_COMMUNITY): Payer: Self-pay

## 2017-09-19 DIAGNOSIS — R079 Chest pain, unspecified: Secondary | ICD-10-CM | POA: Diagnosis not present

## 2017-09-19 DIAGNOSIS — Z886 Allergy status to analgesic agent status: Secondary | ICD-10-CM | POA: Diagnosis not present

## 2017-09-19 DIAGNOSIS — I482 Chronic atrial fibrillation: Secondary | ICD-10-CM | POA: Diagnosis not present

## 2017-09-19 DIAGNOSIS — I110 Hypertensive heart disease with heart failure: Secondary | ICD-10-CM | POA: Diagnosis not present

## 2017-09-19 DIAGNOSIS — I214 Non-ST elevation (NSTEMI) myocardial infarction: Secondary | ICD-10-CM | POA: Diagnosis not present

## 2017-09-19 DIAGNOSIS — I2510 Atherosclerotic heart disease of native coronary artery without angina pectoris: Secondary | ICD-10-CM | POA: Diagnosis not present

## 2017-09-19 DIAGNOSIS — Z885 Allergy status to narcotic agent status: Secondary | ICD-10-CM | POA: Diagnosis not present

## 2017-09-19 DIAGNOSIS — Z888 Allergy status to other drugs, medicaments and biological substances status: Secondary | ICD-10-CM | POA: Diagnosis not present

## 2017-09-19 DIAGNOSIS — Z79899 Other long term (current) drug therapy: Secondary | ICD-10-CM | POA: Diagnosis not present

## 2017-09-19 DIAGNOSIS — Z950 Presence of cardiac pacemaker: Secondary | ICD-10-CM | POA: Diagnosis not present

## 2017-09-19 DIAGNOSIS — I509 Heart failure, unspecified: Secondary | ICD-10-CM | POA: Diagnosis not present

## 2017-09-19 DIAGNOSIS — I255 Ischemic cardiomyopathy: Secondary | ICD-10-CM | POA: Diagnosis not present

## 2017-09-19 DIAGNOSIS — Z7901 Long term (current) use of anticoagulants: Secondary | ICD-10-CM | POA: Diagnosis not present

## 2017-09-19 DIAGNOSIS — E785 Hyperlipidemia, unspecified: Secondary | ICD-10-CM | POA: Diagnosis present

## 2017-09-19 DIAGNOSIS — Z955 Presence of coronary angioplasty implant and graft: Secondary | ICD-10-CM | POA: Diagnosis not present

## 2017-09-21 ENCOUNTER — Encounter (HOSPITAL_COMMUNITY): Payer: Self-pay

## 2017-09-23 ENCOUNTER — Encounter (HOSPITAL_COMMUNITY): Payer: Self-pay

## 2017-09-25 ENCOUNTER — Encounter (HOSPITAL_COMMUNITY): Payer: Self-pay

## 2017-09-28 ENCOUNTER — Encounter (HOSPITAL_COMMUNITY): Payer: Self-pay

## 2017-09-28 DIAGNOSIS — H353112 Nonexudative age-related macular degeneration, right eye, intermediate dry stage: Secondary | ICD-10-CM | POA: Diagnosis not present

## 2017-09-28 DIAGNOSIS — H31011 Macula scars of posterior pole (postinflammatory) (post-traumatic), right eye: Secondary | ICD-10-CM | POA: Diagnosis not present

## 2017-09-28 DIAGNOSIS — H35363 Drusen (degenerative) of macula, bilateral: Secondary | ICD-10-CM | POA: Diagnosis not present

## 2017-09-28 DIAGNOSIS — H26492 Other secondary cataract, left eye: Secondary | ICD-10-CM | POA: Diagnosis not present

## 2017-09-30 ENCOUNTER — Encounter (HOSPITAL_COMMUNITY): Payer: Self-pay

## 2017-09-30 ENCOUNTER — Telehealth: Payer: Self-pay

## 2017-09-30 NOTE — Telephone Encounter (Signed)
Returned pt's call regarding she was confirming her upcoming appts in Sept - CT at [place] on 9-19 and 9-27 for office appt with Dr Denman George- times given as well and instructions for the CT.  Pt voiced understanding. No other needs per pt at this time.

## 2017-10-01 DIAGNOSIS — F4323 Adjustment disorder with mixed anxiety and depressed mood: Secondary | ICD-10-CM | POA: Diagnosis not present

## 2017-10-02 ENCOUNTER — Encounter (HOSPITAL_COMMUNITY): Payer: Self-pay

## 2017-10-04 DIAGNOSIS — R58 Hemorrhage, not elsewhere classified: Secondary | ICD-10-CM

## 2017-10-04 HISTORY — DX: Hemorrhage, not elsewhere classified: R58

## 2017-10-07 ENCOUNTER — Telehealth: Payer: Self-pay

## 2017-10-07 ENCOUNTER — Encounter (HOSPITAL_COMMUNITY): Payer: Self-pay

## 2017-10-07 DIAGNOSIS — I214 Non-ST elevation (NSTEMI) myocardial infarction: Secondary | ICD-10-CM | POA: Insufficient documentation

## 2017-10-07 DIAGNOSIS — Z955 Presence of coronary angioplasty implant and graft: Secondary | ICD-10-CM | POA: Insufficient documentation

## 2017-10-07 DIAGNOSIS — Z48812 Encounter for surgical aftercare following surgery on the circulatory system: Secondary | ICD-10-CM | POA: Insufficient documentation

## 2017-10-07 NOTE — Telephone Encounter (Signed)
Per 9/4 vm returns. Called patient and gave her the number to CT/ at [facility] and transferred call

## 2017-10-08 ENCOUNTER — Other Ambulatory Visit: Payer: Medicare Other | Admitting: *Deleted

## 2017-10-08 ENCOUNTER — Ambulatory Visit (INDEPENDENT_AMBULATORY_CARE_PROVIDER_SITE_OTHER): Payer: Medicare Other | Admitting: *Deleted

## 2017-10-08 DIAGNOSIS — I10 Essential (primary) hypertension: Secondary | ICD-10-CM | POA: Diagnosis not present

## 2017-10-08 DIAGNOSIS — Z5181 Encounter for therapeutic drug level monitoring: Secondary | ICD-10-CM

## 2017-10-08 DIAGNOSIS — I4891 Unspecified atrial fibrillation: Secondary | ICD-10-CM | POA: Diagnosis not present

## 2017-10-08 DIAGNOSIS — I4821 Permanent atrial fibrillation: Secondary | ICD-10-CM

## 2017-10-08 DIAGNOSIS — Z7901 Long term (current) use of anticoagulants: Secondary | ICD-10-CM | POA: Diagnosis not present

## 2017-10-08 DIAGNOSIS — I482 Chronic atrial fibrillation: Secondary | ICD-10-CM | POA: Diagnosis not present

## 2017-10-08 DIAGNOSIS — I25118 Atherosclerotic heart disease of native coronary artery with other forms of angina pectoris: Secondary | ICD-10-CM

## 2017-10-08 LAB — LIPID PANEL
CHOL/HDL RATIO: 2 ratio (ref 0.0–4.4)
Cholesterol, Total: 132 mg/dL (ref 100–199)
HDL: 66 mg/dL (ref >39)
LDL Calculated: 52 mg/dL (ref 0–99)
TRIGLYCERIDES: 68 mg/dL (ref 0–149)
VLDL Cholesterol Cal: 14 mg/dL (ref 5–40)

## 2017-10-08 LAB — HEPATIC FUNCTION PANEL
ALK PHOS: 110 IU/L (ref 39–117)
ALT: 21 IU/L (ref 0–32)
AST: 25 IU/L (ref 0–40)
Albumin: 3.9 g/dL (ref 3.5–4.7)
BILIRUBIN TOTAL: 0.6 mg/dL (ref 0.0–1.2)
Bilirubin, Direct: 0.2 mg/dL (ref 0.00–0.40)
Total Protein: 6.5 g/dL (ref 6.0–8.5)

## 2017-10-08 LAB — POCT INR: INR: 1.8 — AB (ref 2.0–3.0)

## 2017-10-08 NOTE — Patient Instructions (Signed)
Description   Today take 3mg , then Continue on same dose of 2 mg daily except 1 mg on Mondays and Fridays. Keep dark greens consistent. Recheck INR in 2 weeks. Main [phone]. Coumadin Clinic # [phone]

## 2017-10-09 ENCOUNTER — Encounter (HOSPITAL_COMMUNITY)
Admission: RE | Admit: 2017-10-09 | Discharge: 2017-10-09 | Disposition: A | Discharge status: Home / Self Care | Payer: Self-pay | Source: Ambulatory Visit | Attending: Interventional Cardiology | Admitting: Interventional Cardiology

## 2017-10-12 ENCOUNTER — Inpatient Hospital Stay (HOSPITAL_COMMUNITY)
Admission: EM | Admit: 2017-10-12 | Discharge: 2017-10-17 | DRG: 754 | Disposition: A | Discharge status: Home / Self Care | Payer: Medicare Other | Attending: Internal Medicine | Admitting: Internal Medicine

## 2017-10-12 ENCOUNTER — Encounter (HOSPITAL_COMMUNITY): Payer: Self-pay | Admitting: Emergency Medicine

## 2017-10-12 ENCOUNTER — Emergency Department (HOSPITAL_COMMUNITY): Payer: Medicare Other

## 2017-10-12 ENCOUNTER — Encounter (HOSPITAL_COMMUNITY): Payer: Self-pay

## 2017-10-12 DIAGNOSIS — I10 Essential (primary) hypertension: Secondary | ICD-10-CM | POA: Diagnosis present

## 2017-10-12 DIAGNOSIS — E876 Hypokalemia: Secondary | ICD-10-CM | POA: Diagnosis present

## 2017-10-12 DIAGNOSIS — R42 Dizziness and giddiness: Secondary | ICD-10-CM | POA: Diagnosis not present

## 2017-10-12 DIAGNOSIS — Z66 Do not resuscitate: Secondary | ICD-10-CM | POA: Diagnosis present

## 2017-10-12 DIAGNOSIS — R578 Other shock: Secondary | ICD-10-CM | POA: Diagnosis present

## 2017-10-12 DIAGNOSIS — R1907 Generalized intra-abdominal and pelvic swelling, mass and lump: Secondary | ICD-10-CM | POA: Diagnosis not present

## 2017-10-12 DIAGNOSIS — D689 Coagulation defect, unspecified: Secondary | ICD-10-CM | POA: Diagnosis present

## 2017-10-12 DIAGNOSIS — Z9989 Dependence on other enabling machines and devices: Secondary | ICD-10-CM

## 2017-10-12 DIAGNOSIS — Z9842 Cataract extraction status, left eye: Secondary | ICD-10-CM

## 2017-10-12 DIAGNOSIS — I739 Peripheral vascular disease, unspecified: Secondary | ICD-10-CM | POA: Diagnosis present

## 2017-10-12 DIAGNOSIS — R911 Solitary pulmonary nodule: Secondary | ICD-10-CM | POA: Diagnosis present

## 2017-10-12 DIAGNOSIS — Z8619 Personal history of other infectious and parasitic diseases: Secondary | ICD-10-CM

## 2017-10-12 DIAGNOSIS — C562 Malignant neoplasm of left ovary: Principal | ICD-10-CM | POA: Diagnosis present

## 2017-10-12 DIAGNOSIS — I25118 Atherosclerotic heart disease of native coronary artery with other forms of angina pectoris: Secondary | ICD-10-CM | POA: Diagnosis not present

## 2017-10-12 DIAGNOSIS — R58 Hemorrhage, not elsewhere classified: Secondary | ICD-10-CM | POA: Diagnosis not present

## 2017-10-12 DIAGNOSIS — E669 Obesity, unspecified: Secondary | ICD-10-CM | POA: Diagnosis present

## 2017-10-12 DIAGNOSIS — F411 Generalized anxiety disorder: Secondary | ICD-10-CM | POA: Diagnosis present

## 2017-10-12 DIAGNOSIS — Z8543 Personal history of malignant neoplasm of ovary: Secondary | ICD-10-CM

## 2017-10-12 DIAGNOSIS — Z950 Presence of cardiac pacemaker: Secondary | ICD-10-CM | POA: Diagnosis not present

## 2017-10-12 DIAGNOSIS — R0902 Hypoxemia: Secondary | ICD-10-CM | POA: Diagnosis not present

## 2017-10-12 DIAGNOSIS — Z7902 Long term (current) use of antithrombotics/antiplatelets: Secondary | ICD-10-CM

## 2017-10-12 DIAGNOSIS — I2510 Atherosclerotic heart disease of native coronary artery without angina pectoris: Secondary | ICD-10-CM | POA: Diagnosis not present

## 2017-10-12 DIAGNOSIS — R1030 Lower abdominal pain, unspecified: Secondary | ICD-10-CM

## 2017-10-12 DIAGNOSIS — Z87891 Personal history of nicotine dependence: Secondary | ICD-10-CM

## 2017-10-12 DIAGNOSIS — I4819 Other persistent atrial fibrillation: Secondary | ICD-10-CM | POA: Diagnosis present

## 2017-10-12 DIAGNOSIS — I340 Nonrheumatic mitral (valve) insufficiency: Secondary | ICD-10-CM | POA: Diagnosis not present

## 2017-10-12 DIAGNOSIS — Z955 Presence of coronary angioplasty implant and graft: Secondary | ICD-10-CM | POA: Diagnosis not present

## 2017-10-12 DIAGNOSIS — N183 Chronic kidney disease, stage 3 (moderate): Secondary | ICD-10-CM | POA: Diagnosis present

## 2017-10-12 DIAGNOSIS — Z841 Family history of disorders of kidney and ureter: Secondary | ICD-10-CM

## 2017-10-12 DIAGNOSIS — R0602 Shortness of breath: Secondary | ICD-10-CM | POA: Diagnosis not present

## 2017-10-12 DIAGNOSIS — Z7901 Long term (current) use of anticoagulants: Secondary | ICD-10-CM

## 2017-10-12 DIAGNOSIS — I130 Hypertensive heart and chronic kidney disease with heart failure and stage 1 through stage 4 chronic kidney disease, or unspecified chronic kidney disease: Secondary | ICD-10-CM | POA: Diagnosis present

## 2017-10-12 DIAGNOSIS — Z90710 Acquired absence of both cervix and uterus: Secondary | ICD-10-CM

## 2017-10-12 DIAGNOSIS — R1900 Intra-abdominal and pelvic swelling, mass and lump, unspecified site: Secondary | ICD-10-CM | POA: Diagnosis not present

## 2017-10-12 DIAGNOSIS — G4733 Obstructive sleep apnea (adult) (pediatric): Secondary | ICD-10-CM | POA: Diagnosis not present

## 2017-10-12 DIAGNOSIS — R52 Pain, unspecified: Secondary | ICD-10-CM | POA: Diagnosis not present

## 2017-10-12 DIAGNOSIS — Z6835 Body mass index (BMI) 35.0-35.9, adult: Secondary | ICD-10-CM | POA: Diagnosis not present

## 2017-10-12 DIAGNOSIS — I447 Left bundle-branch block, unspecified: Secondary | ICD-10-CM | POA: Diagnosis present

## 2017-10-12 DIAGNOSIS — Z8249 Family history of ischemic heart disease and other diseases of the circulatory system: Secondary | ICD-10-CM

## 2017-10-12 DIAGNOSIS — R55 Syncope and collapse: Secondary | ICD-10-CM | POA: Diagnosis not present

## 2017-10-12 DIAGNOSIS — R1032 Left lower quadrant pain: Secondary | ICD-10-CM | POA: Diagnosis not present

## 2017-10-12 DIAGNOSIS — Z9841 Cataract extraction status, right eye: Secondary | ICD-10-CM

## 2017-10-12 DIAGNOSIS — D62 Acute posthemorrhagic anemia: Secondary | ICD-10-CM | POA: Diagnosis not present

## 2017-10-12 DIAGNOSIS — R5383 Other fatigue: Secondary | ICD-10-CM | POA: Diagnosis not present

## 2017-10-12 DIAGNOSIS — Z91030 Bee allergy status: Secondary | ICD-10-CM

## 2017-10-12 DIAGNOSIS — Z79899 Other long term (current) drug therapy: Secondary | ICD-10-CM

## 2017-10-12 DIAGNOSIS — Z9049 Acquired absence of other specified parts of digestive tract: Secondary | ICD-10-CM

## 2017-10-12 DIAGNOSIS — Z01810 Encounter for preprocedural cardiovascular examination: Secondary | ICD-10-CM

## 2017-10-12 DIAGNOSIS — I252 Old myocardial infarction: Secondary | ICD-10-CM

## 2017-10-12 DIAGNOSIS — R0600 Dyspnea, unspecified: Secondary | ICD-10-CM

## 2017-10-12 DIAGNOSIS — E782 Mixed hyperlipidemia: Secondary | ICD-10-CM | POA: Diagnosis not present

## 2017-10-12 DIAGNOSIS — E785 Hyperlipidemia, unspecified: Secondary | ICD-10-CM | POA: Diagnosis present

## 2017-10-12 DIAGNOSIS — C569 Malignant neoplasm of unspecified ovary: Secondary | ICD-10-CM | POA: Diagnosis present

## 2017-10-12 DIAGNOSIS — I5040 Unspecified combined systolic (congestive) and diastolic (congestive) heart failure: Secondary | ICD-10-CM | POA: Diagnosis present

## 2017-10-12 DIAGNOSIS — I5043 Acute on chronic combined systolic (congestive) and diastolic (congestive) heart failure: Secondary | ICD-10-CM | POA: Diagnosis present

## 2017-10-12 DIAGNOSIS — J9811 Atelectasis: Secondary | ICD-10-CM | POA: Diagnosis not present

## 2017-10-12 DIAGNOSIS — K661 Hemoperitoneum: Secondary | ICD-10-CM

## 2017-10-12 DIAGNOSIS — N179 Acute kidney failure, unspecified: Secondary | ICD-10-CM | POA: Diagnosis present

## 2017-10-12 DIAGNOSIS — R109 Unspecified abdominal pain: Secondary | ICD-10-CM | POA: Diagnosis present

## 2017-10-12 DIAGNOSIS — J90 Pleural effusion, not elsewhere classified: Secondary | ICD-10-CM | POA: Diagnosis not present

## 2017-10-12 DIAGNOSIS — I481 Persistent atrial fibrillation: Secondary | ICD-10-CM | POA: Diagnosis not present

## 2017-10-12 DIAGNOSIS — Z888 Allergy status to other drugs, medicaments and biological substances status: Secondary | ICD-10-CM

## 2017-10-12 LAB — URINALYSIS, ROUTINE W REFLEX MICROSCOPIC
BACTERIA UA: NONE SEEN
BILIRUBIN URINE: NEGATIVE
GLUCOSE, UA: NEGATIVE mg/dL
HGB URINE DIPSTICK: NEGATIVE
Ketones, ur: NEGATIVE mg/dL
NITRITE: NEGATIVE
Protein, ur: NEGATIVE mg/dL
pH: 6 (ref 5.0–8.0)

## 2017-10-12 LAB — COMPREHENSIVE METABOLIC PANEL
ALT: 21 U/L (ref 0–44)
AST: 27 U/L (ref 15–41)
Albumin: 2.9 g/dL — ABNORMAL LOW (ref 3.5–5.0)
Alkaline Phosphatase: 72 U/L (ref 38–126)
Anion gap: 10 (ref 5–15)
BILIRUBIN TOTAL: 1.1 mg/dL (ref 0.3–1.2)
BUN: 22 mg/dL (ref 8–23)
CALCIUM: 8.7 mg/dL — AB (ref 8.9–10.3)
CHLORIDE: 104 mmol/L (ref 98–111)
CO2: 29 mmol/L (ref 22–32)
CREATININE: 1.24 mg/dL — AB (ref 0.44–1.00)
GFR calc Af Amer: 45 mL/min — ABNORMAL LOW (ref >60)
GFR, EST NON AFRICAN AMERICAN: 38 mL/min — AB (ref >60)
Glucose, Bld: 196 mg/dL — ABNORMAL HIGH (ref 70–99)
Potassium: 3.8 mmol/L (ref 3.5–5.1)
Sodium: 143 mmol/L (ref 135–145)
TOTAL PROTEIN: 5.5 g/dL — AB (ref 6.5–8.1)

## 2017-10-12 LAB — CBC WITH DIFFERENTIAL/PLATELET
ABS IMMATURE GRANULOCYTES: 0 10*3/uL (ref 0.0–0.1)
Basophils Absolute: 0 10*3/uL (ref 0.0–0.1)
Basophils Relative: 0 %
EOS PCT: 1 %
Eosinophils Absolute: 0.1 10*3/uL (ref 0.0–0.7)
HEMATOCRIT: 35 % — AB (ref 36.0–46.0)
HEMOGLOBIN: 10.9 g/dL — AB (ref 12.0–15.0)
Immature Granulocytes: 0 %
LYMPHS ABS: 1.2 10*3/uL (ref 0.7–4.0)
LYMPHS PCT: 13 %
MCH: 30.6 pg (ref 26.0–34.0)
MCHC: 31.1 g/dL (ref 30.0–36.0)
MCV: 98.3 fL (ref 78.0–100.0)
MONO ABS: 0.5 10*3/uL (ref 0.1–1.0)
MONOS PCT: 5 %
NEUTROS ABS: 7.3 10*3/uL (ref 1.7–7.7)
Neutrophils Relative %: 81 %
Platelets: 216 10*3/uL (ref 150–400)
RBC: 3.56 MIL/uL — ABNORMAL LOW (ref 3.87–5.11)
RDW: 15 % (ref 11.5–15.5)
WBC: 9.1 10*3/uL (ref 4.0–10.5)

## 2017-10-12 LAB — I-STAT CHEM 8, ED
BUN: 24 mg/dL — AB (ref 8–23)
CALCIUM ION: 1.12 mmol/L — AB (ref 1.15–1.40)
CREATININE: 1.2 mg/dL — AB (ref 0.44–1.00)
Chloride: 101 mmol/L (ref 98–111)
Glucose, Bld: 191 mg/dL — ABNORMAL HIGH (ref 70–99)
HCT: 32 % — ABNORMAL LOW (ref 36.0–46.0)
Hemoglobin: 10.9 g/dL — ABNORMAL LOW (ref 12.0–15.0)
Potassium: 3.7 mmol/L (ref 3.5–5.1)
SODIUM: 142 mmol/L (ref 135–145)
TCO2: 29 mmol/L (ref 22–32)

## 2017-10-12 LAB — CBG MONITORING, ED: Glucose-Capillary: 179 mg/dL — ABNORMAL HIGH (ref 70–99)

## 2017-10-12 LAB — ABO/RH: ABO/RH(D): A POS

## 2017-10-12 LAB — PROTIME-INR
INR: 3.21
Prothrombin Time: 32.6 seconds — ABNORMAL HIGH (ref 11.4–15.2)

## 2017-10-12 LAB — LIPASE, BLOOD: LIPASE: 48 U/L (ref 11–51)

## 2017-10-12 LAB — I-STAT TROPONIN, ED: TROPONIN I, POC: 0.05 ng/mL (ref 0.00–0.08)

## 2017-10-12 MED ORDER — SODIUM CHLORIDE 0.9 % IV BOLUS
500.0000 mL | Freq: Once | INTRAVENOUS | Status: AC
  Administered 2017-10-12: 500 mL via INTRAVENOUS

## 2017-10-12 MED ORDER — METOPROLOL TARTRATE 25 MG PO TABS
100.0000 mg | ORAL_TABLET | Freq: Two times a day (BID) | ORAL | Status: DC
  Administered 2017-10-12 – 2017-10-17 (×11): 100 mg via ORAL
  Filled 2017-10-12 (×13): qty 4

## 2017-10-12 MED ORDER — CLOPIDOGREL BISULFATE 75 MG PO TABS: 75.0000 mg | ORAL_TABLET | Freq: Every morning | ORAL | Status: DC

## 2017-10-12 MED ORDER — ONDANSETRON HCL 4 MG/2ML IJ SOLN: 4.0000 mg | Freq: Four times a day (QID) | INTRAMUSCULAR | Status: DC | PRN

## 2017-10-12 MED ORDER — ACETAMINOPHEN 650 MG RE SUPP: 650.0000 mg | Freq: Four times a day (QID) | RECTAL | Status: DC | PRN

## 2017-10-12 MED ORDER — DILTIAZEM HCL ER COATED BEADS 120 MG PO CP24
120.0000 mg | ORAL_CAPSULE | Freq: Every day | ORAL | Status: DC
  Administered 2017-10-12 – 2017-10-16 (×5): 120 mg via ORAL
  Filled 2017-10-12 (×5): qty 1

## 2017-10-12 MED ORDER — ATORVASTATIN CALCIUM 20 MG PO TABS
80.0000 mg | ORAL_TABLET | Freq: Every day | ORAL | Status: DC
  Administered 2017-10-12 – 2017-10-16 (×5): 80 mg via ORAL
  Filled 2017-10-12 (×5): qty 4

## 2017-10-12 MED ORDER — IOPAMIDOL (ISOVUE-300) INJECTION 61%
INTRAVENOUS | Status: AC
  Filled 2017-10-12: qty 100

## 2017-10-12 MED ORDER — SODIUM CHLORIDE 0.9 % IV SOLN
1.0000 g | Freq: Once | INTRAVENOUS | Status: AC
  Administered 2017-10-12: 1 g via INTRAVENOUS
  Filled 2017-10-12: qty 10

## 2017-10-12 MED ORDER — SODIUM CHLORIDE 0.9 % IV SOLN: 10.0000 mL/h | Freq: Once | INTRAVENOUS | Status: DC

## 2017-10-12 MED ORDER — ONDANSETRON HCL 4 MG/2ML IJ SOLN
4.0000 mg | Freq: Once | INTRAMUSCULAR | Status: AC
  Administered 2017-10-12: 4 mg via INTRAVENOUS
  Filled 2017-10-12: qty 2

## 2017-10-12 MED ORDER — IOPAMIDOL (ISOVUE-300) INJECTION 61%
80.0000 mL | Freq: Once | INTRAVENOUS | Status: AC | PRN
  Administered 2017-10-12: 80 mL via INTRAVENOUS

## 2017-10-12 MED ORDER — METRONIDAZOLE IN NACL 5-0.79 MG/ML-% IV SOLN
500.0000 mg | Freq: Once | INTRAVENOUS | Status: DC
  Filled 2017-10-12: qty 100

## 2017-10-12 MED ORDER — ISOSORBIDE MONONITRATE ER 30 MG PO TB24
60.0000 mg | ORAL_TABLET | Freq: Every day | ORAL | Status: DC
  Administered 2017-10-12 – 2017-10-17 (×6): 60 mg via ORAL
  Filled 2017-10-12 (×8): qty 2

## 2017-10-12 MED ORDER — LACTATED RINGERS IV SOLN
INTRAVENOUS | Status: DC
  Administered 2017-10-12: 17:00:00 via INTRAVENOUS

## 2017-10-12 MED ORDER — ONDANSETRON HCL 4 MG PO TABS: 4.0000 mg | ORAL_TABLET | Freq: Four times a day (QID) | ORAL | Status: DC | PRN

## 2017-10-12 MED ORDER — MORPHINE SULFATE (PF) 2 MG/ML IV SOLN: 2.0000 mg | INTRAVENOUS | Status: DC | PRN

## 2017-10-12 MED ORDER — DOCUSATE SODIUM 100 MG PO CAPS
100.0000 mg | ORAL_CAPSULE | Freq: Two times a day (BID) | ORAL | Status: DC
  Administered 2017-10-12 – 2017-10-17 (×10): 100 mg via ORAL
  Filled 2017-10-12 (×11): qty 1

## 2017-10-12 MED ORDER — ACETAMINOPHEN 325 MG PO TABS: 650.0000 mg | ORAL_TABLET | Freq: Four times a day (QID) | ORAL | Status: DC | PRN

## 2017-10-12 MED ORDER — DILTIAZEM HCL ER COATED BEADS 240 MG PO CP24
240.0000 mg | ORAL_CAPSULE | Freq: Every day | ORAL | Status: DC
  Administered 2017-10-12 – 2017-10-17 (×6): 240 mg via ORAL
  Filled 2017-10-12 (×8): qty 1

## 2017-10-12 MED ORDER — MORPHINE SULFATE (PF) 4 MG/ML IV SOLN
4.0000 mg | Freq: Once | INTRAVENOUS | Status: AC
  Administered 2017-10-12: 4 mg via INTRAVENOUS
  Filled 2017-10-12: qty 1

## 2017-10-12 MED ORDER — IOPAMIDOL (ISOVUE-300) INJECTION 61%: 100.0000 mL | Freq: Once | INTRAVENOUS | Status: DC | PRN

## 2017-10-12 MED ORDER — VITAMIN K1 10 MG/ML IJ SOLN
10.0000 mg | Freq: Once | INTRAVENOUS | Status: AC
  Administered 2017-10-12: 10 mg via INTRAVENOUS
  Filled 2017-10-12: qty 1

## 2017-10-12 NOTE — ED Notes (Signed)
Consent for FFP signed and at the bedside

## 2017-10-12 NOTE — ED Notes (Signed)
Returned from ct scan 

## 2017-10-12 NOTE — ED Notes (Signed)
Patient transported to CT 

## 2017-10-12 NOTE — ED Triage Notes (Signed)
Pt here from home with c/o llq pain that woke her from her sleep , pt was nauseated but did not vomit, pt was hypotensive with ems

## 2017-10-12 NOTE — ED Notes (Signed)
Attempted report 

## 2017-10-12 NOTE — ED Notes (Signed)
Sats down to 77 % on room air after morphine pt placed on 2liters O2 n/c sats up to 98% Md made aware

## 2017-10-12 NOTE — Progress Notes (Signed)
RT offered pt CPAP for the night she declined stating she wants to bring her home machine in that she is more comfortable wearing. Pt states he setting at home is 3.5 with no O2 bled in to the system. RT will continue to monitor.

## 2017-10-12 NOTE — Plan of Care (Signed)
  Problem: Health Behavior/Discharge Planning: Goal: Ability to manage health-related needs will improve Outcome: Progressing   Problem: Clinical Measurements: Goal: Ability to maintain clinical measurements within normal limits will improve Outcome: Progressing Goal: Diagnostic test results will improve Outcome: Progressing   Problem: Activity: Goal: Risk for activity intolerance will decrease Outcome: Progressing   Problem: Nutrition: Goal: Adequate nutrition will be maintained Outcome: Progressing   Problem: Coping: Goal: Level of anxiety will decrease Outcome: Progressing   Problem: Pain Managment: Goal: General experience of comfort will improve Outcome: Progressing

## 2017-10-12 NOTE — ED Provider Notes (Signed)
[facility] EMERGENCY DEPARTMENT Provider Note   CSN: [id number] Arrival date & time: 10/12/17  0747     History   Chief Complaint Chief Complaint  Patient presents with  . Abdominal Pain  . Hypotension    HPI Patty [name] is a 84 y.o. female.  She presents from home by ambulance with complaint of acute onset of left lower quadrant pain that woke her from sleep around 3 AM.  Associated with nausea and lightheadedness.  EMS found blood pressure in the 70s on arrival.  Here she is complaining of 7 out of 10 left lower quadrant pain that is minimal at rest but 7 out of 10 when she pushes on the left lower quadrant.  She has had no fevers no chills no cough no shortness of breath.  The nausea she says is been baseline for this year as she is been ill with multiple problems.  Her bowels been normal and no urinary symptoms.  No trauma.  No unusual foods or travel.  She does have a cardiac history pacemaker and is on anticoagulation.  The history is provided by the patient.  Abdominal Pain   This is a new problem. The current episode started 3 to 5 hours ago. The problem occurs constantly. The problem has not changed since onset.The pain is associated with an unknown factor. The pain is located in the LLQ. The quality of the pain is sharp. The pain is at a severity of 7/10. Associated symptoms include nausea. Pertinent negatives include anorexia, fever, belching, diarrhea, flatus, hematochezia, melena, vomiting, constipation, dysuria, frequency, hematuria, headaches and arthralgias. The symptoms are aggravated by palpation. The symptoms are relieved by being still.    Past Medical History:  Diagnosis Date  . Chronic anticoagulation - coumadin, CHADS2VASC=6 05/17/2015  . CKD (chronic kidney disease), stage III ([place])   . Combined systolic and diastolic heart failure ([place])   . Coronary artery disease    a. s/p multiple stents - stenting x 2 to the LAD and x 1 to the LCx in  2000, rotational arthrectomy to proximal LCx and DES to LAD in 2014 and DES to LCx in 2014, along with DES to LAD for re-in-stent stenosis in 2015, and DES to ostial LAD in 2016. b. 07/2016 - orbital atherectomy & DES to mid LCx.  . Diverticulitis   . Granulosa cell carcinoma ([place] [name])    abd; last episode was in 2009  . Hyperlipidemia   . Hypertension   . LBBB (left bundle branch block)       . Myocardial infarction ([place]) 2002  . Obesity   . OSA on CPAP   . Pacemaker failure    a. Prior leadless PPM with premature battery failure, being managed conservatively without replacement.  . Peripheral vascular disease ([place])   . Permanent atrial fibrillation ([place]) 2013  . Renal artery stenosis ([place])   . Tachycardia-bradycardia syndrome ([place])    a. s/p leadless pacemaker ([name]) implanted by Dr [name]  . Varicose veins     Patient Active Problem List   Diagnosis Date Noted  . Persistent atrial fibrillation ([place])   . Warfarin anticoagulation   . DOE (dyspnea on exertion)   . Status post coronary artery stent placement   . Normocytic anemia 07/11/2016  . History of small bowel obstruction 12/22/2015  . Permanent atrial fibrillation with RVR ([place]) 12/15/2015  . Demand ischemia ([place]) 12/15/2015  . NSTEMI (non-ST elevated myocardial infarction) ([place]) 05/18/2015  . Chronic anticoagulation -  coumadin, CHADS2VASC=6 05/17/2015  . Unstable angina ([place]) 05/17/2015  . Swelling of lower extremity 04/18/2015  . Encounter for therapeutic drug monitoring 08/25/2014  . 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[place]) 06/16/2014  . Ischemic chest pain   . OSA on CPAP 05/14/2014  . Chest pain 01/05/2014  . Atrial flutter ([place])   . Hypokalemia   . Pacemaker - St Jude Leadless PPM   . Combined systolic and diastolic heart failure, NYHA class 3 ([place])   . Tachycardia-bradycardia syndrome ([place]) 09/30/2012  . Atrial fibrillation with RVR ([place]) 12/28/2011  . Ventral hernia 10/16/2011  . Granulosa cell tumor  of ovary 04/23/2011  . CAD (coronary artery disease) 10/03/2010  . Essential hypertension 10/03/2010  . Hyperlipidemia 10/03/2010  . Renal artery stenosis ([place]) 10/03/2010  . Obesity 10/03/2010    Past Surgical History:  Procedure Laterality Date  . ABDOMINAL HYSTERECTOMY    . CARDIAC CATHETERIZATION  09/03/2007   EF 70%; Failed attempt at PCI to OM  . CARDIAC CATHETERIZATION  11/01/2003   EF 70%  . CARDIAC CATHETERIZATION N/A 12/14/2015   Procedure: Left Heart Cath and Coronary Angiography;  Surgeon: Burnell Blanks, MD;  Location: [facility] CV LAB;  Service: Cardiovascular;  Laterality: N/A;  . CARDIOVERSION  12/31/2011   Procedure: CARDIOVERSION;  Surgeon: [name], MD;  Location: [facility] ENDOSCOPY;  Service: Cardiovascular;  Laterality: N/A;  . CARDIOVERSION N/A 12/31/2011   Procedure: CARDIOVERSION;  Surgeon: [name], MD;  Location: [facility] CATH LAB;  Service: Cardiovascular;  Laterality: N/A;  . CATARACT EXTRACTION, BILATERAL  2015  . CHOLECYSTECTOMY  1980's  . COLON SURGERY  2004   colectomy for diverticulosis  . CORONARY ANGIOPLASTY WITH STENT PLACEMENT  2000; 08/11/2012; 11/12/2012   3 + 2 LAD & CFX; 2nd CFX stent 11/12/2012  . CORONARY ATHERECTOMY N/A 07/15/2016   Procedure: Coronary Atherectomy;  Surgeon: Martinique, Peter M, MD;  Location: [facility] CV LAB;  Service: Cardiovascular;  Laterality: N/A;  . CORONARY BALLOON ANGIOPLASTY N/A 07/14/2016   Procedure: Coronary Balloon Angioplasty;  Surgeon: Martinique, Peter M, MD;  Location: [facility] CV LAB;  Service: Cardiovascular;  Laterality: N/A;  . CORONARY STENT INTERVENTION N/A 07/15/2016   Procedure: Coronary Stent Intervention;  Surgeon: Martinique, Peter M, MD;  Location: [facility] CV LAB;  Service: Cardiovascular;  Laterality: N/A;  . FRACTIONAL FLOW RESERVE WIRE  10/07/2013   Procedure: [place];  Surgeon: [name], MD;  Location: [facility] CATH LAB;  Service: Cardiovascular;;  .  granulosa tumor excision  2000; 2003; 2004; 2007   "all in my abdomen including small intestines, outside my ?uterus/etc" (08/11/2012)  . HERNIA REPAIR  2005   "laparoscopic"  . LEFT HEART CATH AND CORONARY ANGIOGRAPHY N/A 07/14/2016   Procedure: Left Heart Cath and Coronary Angiography;  Surgeon: Martinique, Peter M, MD;  Location: [facility] CV LAB;  Service: Cardiovascular;  Laterality: N/A;  . LEFT HEART CATHETERIZATION WITH CORONARY ANGIOGRAM N/A 11/12/2012   Procedure: LEFT HEART CATHETERIZATION WITH CORONARY ANGIOGRAM;  Surgeon: [name], MD;  Location: [facility] CATH LAB;  Service: Cardiovascular;  Laterality: N/A;  . LEFT HEART CATHETERIZATION WITH CORONARY ANGIOGRAM N/A 10/07/2013   Procedure: LEFT HEART CATHETERIZATION WITH CORONARY ANGIOGRAM;  Surgeon: [name], MD;  Location: [facility] CATH LAB;  Service: Cardiovascular;  Laterality: N/A;  . LEFT HEART CATHETERIZATION WITH CORONARY ANGIOGRAM N/A 12/14/2013   Procedure: LEFT HEART CATHETERIZATION WITH CORONARY ANGIOGRAM;  Surgeon: Sinclair Grooms, MD;  Location:  [facility] CATH LAB;  Service: Cardiovascular;  Laterality: N/A;  . LEFT HEART CATHETERIZATION WITH CORONARY ANGIOGRAM N/A 05/16/2014   Procedure: LEFT HEART CATHETERIZATION WITH CORONARY ANGIOGRAM;  Surgeon: Sherren Mocha, MD;  Location: [facility] CATH LAB;  Service: Cardiovascular;  Laterality: N/A;  . PERCUTANEOUS CORONARY INTERVENTION-BALLOON ONLY  08/04/2012   Procedure: PERCUTANEOUS CORONARY INTERVENTION-BALLOON ONLY;  Surgeon: [name], MD;  Location: [facility] CATH LAB;  Service: Cardiovascular;;  . PERCUTANEOUS CORONARY ROTOBLATOR INTERVENTION (PCI-R) N/A 08/11/2012   Procedure: PERCUTANEOUS CORONARY ROTOBLATOR INTERVENTION (PCI-R);  Surgeon: [name], MD;  Location: [facility] CATH LAB;  Service: Cardiovascular;  Laterality: N/A;  . PERCUTANEOUS CORONARY STENT INTERVENTION (PCI-S)  10/07/2013   Procedure: PERCUTANEOUS CORONARY STENT INTERVENTION (PCI-S);  Surgeon: [name], MD;  Location: [facility] CATH LAB;  Service: Cardiovascular;;  . PERMANENT PACEMAKER INSERTION N/A 03/16/2012   [name] (SJM) leadless pacemaker (LEADLESS II STUDY PATEINT)  . SALIVARY GLAND SURGERY  2000's   "had a little lump removed; granulosa related; it was benign" (08/11/2012)  . TEE WITHOUT CARDIOVERSION  12/31/2011   Procedure: TRANSESOPHAGEAL ECHOCARDIOGRAM (TEE);  Surgeon: [name], MD;  Location: [facility];  Service: Cardiovascular;  Laterality: N/A;  . VARICOSE VEIN SURGERY Bilateral 1977     OB History   None      Home Medications    Prior to Admission medications   Medication Sig Start Date End Date Taking? Authorizing Provider  acyclovir (ZOVIRAX) 400 MG tablet Take 1 tablet (400 mg total) by mouth 4 (four) times daily. 06/29/17   Domenic Moras, PA-C  atorvastatin (LIPITOR) 80 MG tablet TAKE 1 TABLET BY MOUTH EVERY MORNING 05/04/17   [name], MD  beta carotene w/minerals (OCUVITE) tablet Take 1 tablet by mouth 2 (two) times daily.     [provider]  Calcium Carb-Cholecalciferol (CALTRATE 600+D) 600-800 MG-UNIT TABS Take 1 tablet by mouth every evening.     [provider]  clopidogrel (PLAVIX) 75 MG tablet TAKE 1 TABLET BY MOUTH EVERY DAY WITH BREAKFAST 01/19/17   [name], MD  Coenzyme Q-10 100 MG capsule Take 200 mg by mouth daily.     [provider]  diltiazem (CARDIZEM CD) 120 MG 24 hr capsule TAKE ONE CAPSULE BY MOUTH AT BEDTIME 08/17/17   Dunn, Dayna N, PA-C  diltiazem (CARDIZEM CD) 240 MG 24 hr capsule TAKE ONE CAPSULE BY MOUTH DAILY WITH BREAKFAST 09/03/17   [name], MD  furosemide (LASIX) 40 MG tablet Take 80 mg by mouth daily.    [provider]  isosorbide mononitrate (IMDUR) 60 MG 24 hr tablet TAKE 1 TABLET BY MOUTH DAILY 04/17/17   Patsey Berthold, NP  KLOR-CON M20 20 MEQ tablet TAKE 1 TABLET BY MOUTH EVERY DAY 08/05/17   [name], MD  metoprolol tartrate  (LOPRESSOR) 100 MG tablet Take 1 tablet (100 mg total) by mouth every morning. Also takes (3) 25 mg tabs (75 mg) in the evening 03/31/17   Allred, Jeneen Rinks, MD  Multiple Vitamin (MULTIVITAMIN) capsule Take 1 capsule by mouth daily.    [provider]  nitroGLYCERIN (NITROSTAT) 0.4 MG SL tablet PLACE 1 TABLET (0.4 MG TOTAL) UNDER THE TONGUE EVERY 5 (FIVE) MINUTES AS NEEDED. FOR CHEST PAIN 08/31/17   Thompson Grayer, MD  Omega-3 Fatty Acids (FISH OIL PO) Take 1,200 mg by mouth daily.     [provider]  spironolactone (ALDACTONE) 25 MG tablet Take 25 mg by mouth as needed (swelling).  [provider]  warfarin (COUMADIN) 2 MG tablet TAKE AS DIRECTED BY COUMADIN CLINIC FOR 30 DAYS 07/20/17   [name], MD    Family History Family History  Problem Relation Age of Onset  . Stroke Mother   . Heart attack Father   . Diabetes Father   . Hypertension Father   . Heart attack Brother   . Diabetes Brother   . Hypertension Brother   . Kidney failure Brother     Social History Social History   Tobacco Use  . Smoking status: Former Smoker    Packs/day: 1.00    Years: 32.00    Pack years: 32.00    Types: Cigarettes    Last attempt to quit: 02/03/1974    Years since quitting: 43.7  . Smokeless tobacco: Never Used  Substance Use Topics  . Alcohol use: Yes    Alcohol/week: 5.0 standard drinks    Types: 5 Glasses of wine per week    Comment: 2017 WINE WITH DINNER   . Drug use: No     Allergies   Bee venom; Other; Amiodarone; and Prednisone   Review of Systems Review of Systems  Constitutional: Positive for fatigue. Negative for chills and fever.  HENT: Negative for ear pain and sore throat.   Eyes: Negative for pain and visual disturbance.  Respiratory: Negative for cough and shortness of breath.   Cardiovascular: Negative for chest pain and palpitations.  Gastrointestinal: Positive for abdominal pain and nausea. Negative for anorexia, constipation,  diarrhea, flatus, hematochezia, melena and vomiting.  Genitourinary: Negative for dysuria, frequency and hematuria.  Musculoskeletal: Negative for arthralgias and back pain.  Skin: Negative for color change and rash.  Neurological: Negative for seizures, syncope and headaches.  All other systems reviewed and are negative.    Physical Exam Updated Vital Signs BP (!) 88/47   Pulse 78   Resp 17   SpO2 95%   Physical Exam  Constitutional: She appears well-developed and well-nourished. No distress.  HENT:  Head: Normocephalic and atraumatic.  Eyes: Conjunctivae are normal.  Neck: Neck supple.  Cardiovascular: Normal rate and regular rhythm.  No murmur heard. Pulmonary/Chest: Effort normal and breath sounds normal. No respiratory distress.  Abdominal: Soft. There is tenderness in the left lower quadrant. There is rebound. There is no rigidity and no guarding. No hernia.  Musculoskeletal: She exhibits no edema, tenderness or deformity.  Neurological: She is alert.  Skin: Skin is warm and dry. Capillary refill takes less than 2 seconds.  Psychiatric: She has a normal mood and affect.  Nursing note and vitals reviewed.    ED Treatments / Results  Labs (all labs ordered are listed, but only abnormal results are displayed) Labs Reviewed  COMPREHENSIVE METABOLIC PANEL - Abnormal; Notable for the following components:      Result Value   Glucose, Bld 196 (*)    Creatinine, Ser 1.24 (*)    Calcium 8.7 (*)    Total Protein 5.5 (*)    Albumin 2.9 (*)    GFR calc non Af Amer 38 (*)    GFR calc Af Amer 45 (*)    All other components within normal limits  PROTIME-INR - Abnormal; Notable for the following components:   Prothrombin Time 32.6 (*)    All other components within normal limits  CBC WITH DIFFERENTIAL/PLATELET - Abnormal; Notable for the following components:   RBC 3.56 (*)    Hemoglobin 10.9 (*)    HCT 35.0 (*)  All other components within normal limits  URINALYSIS,  ROUTINE W REFLEX MICROSCOPIC - Abnormal; Notable for the following components:   Specific Gravity, Urine >1.046 (*)    Leukocytes, UA TRACE (*)    All other components within normal limits  BASIC METABOLIC PANEL - Abnormal; Notable for the following components:   Glucose, Bld 122 (*)    Creatinine, Ser 1.05 (*)    Calcium 8.4 (*)    GFR calc non Af Amer 47 (*)    GFR calc Af Amer 55 (*)    All other components within normal limits  CBC - Abnormal; Notable for the following components:   RBC 2.71 (*)    Hemoglobin 8.4 (*)    HCT 26.5 (*)    All other components within normal limits  PROTIME-INR - Abnormal; Notable for the following components:   Prothrombin Time 15.8 (*)    All other components within normal limits  CBG MONITORING, ED - Abnormal; Notable for the following components:   Glucose-Capillary 179 (*)    All other components within normal limits  I-STAT CHEM 8, ED - Abnormal; Notable for the following components:   BUN 24 (*)    Creatinine, Ser 1.20 (*)    Glucose, Bld 191 (*)    Calcium, Ion 1.12 (*)    Hemoglobin 10.9 (*)    HCT 32.0 (*)    All other components within normal limits  LIPASE, BLOOD  I-STAT TROPONIN, ED  TYPE AND SCREEN  PREPARE FRESH FROZEN PLASMA  ABO/RH    EKG EKG Interpretation  Date/Time:  Monday October 12 2017 07:55:47 EDT Ventricular Rate:  78 PR Interval:    QRS Duration: 166 QT Interval:  470 QTC Calculation: 536 R Axis:   -48 Text Interpretation:  Sinus or ectopic atrial rhythm Prolonged PR interval Nonspecific IVCD with LAD LVH with secondary repolarization abnormality intpermittent pacing Confirmed by Aletta Edouard [phone]) on 10/12/2017 8:05:56 AM   Radiology Ct Abdomen Pelvis W Contrast  Result Date: 10/12/2017 CLINICAL DATA:  Left lower quadrant pain, nausea. History of metastatic ovarian cancer. EXAM: CT ABDOMEN AND PELVIS WITH CONTRAST TECHNIQUE: Multidetector CT imaging of the abdomen and pelvis was performed using the  standard protocol following bolus administration of intravenous contrast. CONTRAST:  75mL ISOVUE-300 IOPAMIDOL (ISOVUE-300) INJECTION 61% COMPARISON:  03/24/2017 FINDINGS: Motion degraded images. Lower chest: 11 mm nodule at the right lung base (series 7/image 3), new. Mild right lower lobe atelectasis. Cardiomegaly. Three vessel coronary atherosclerosis. Pacemaker leads, incompletely visualized. Hepatobiliary: 1.9 cm low-density lesion in segment 6 of the liver (series 4/image 30), with associated postsurgical change. Status post cholecystectomy. No intrahepatic or extrahepatic ductal dilatation. Pancreas: Within normal limits. Spleen: Within normal limits. Adrenals/Urinary Tract: Adrenal glands are within normal limits. Left renal cortical scarring/atrophy. Right lower pole cortical scarring (series 4/image 41). No hydronephrosis. Left bladder implants, including a dominant 2.1 cm lesion (series 4/image 35), previously 1.9 cm. Additional lesion along the left pubic symphysis is poorly visualized on the current study (series 4/image 77). Stomach/Bowel: Stomach is notable for a tiny hiatal hernia. No evidence of bowel obstruction. Normal appendix (series 4/image 47). Vascular/Lymphatic: No evidence of abdominal aortic aneurysm. Atherosclerotic calcifications of the abdominal aorta and branch vessels. No suspicious abdominopelvic lymphadenopathy. Reproductive: Status post hysterectomy. 5.6 x 8.2 cm predominantly cystic mass in the left adnexa/lower pelvis (series 4/image 69), previously 4.7 x 4.5 cm. Right medial aspect of the lesion has hemorrhaged into the extraperitoneum, measuring approximately 9.2 x 9.4 cm superior to  the mass (series 4/image 60) with additional fluid/hemorrhage in the anterior abdominal mesentery measuring approximately 4.4 x 15.6 cm (series 4/image 65). Other: Additional peritoneal disease/omental caking beneath the anterior abdominal wall, including a 2.8 cm cystic/solid implant beneath  midline anterior abdominal wall mesh (series 4/image 63), previously 2.1 cm. Additional fluid/hemorrhage along the liver, spleen, and right pericolic gutter. Musculoskeletal: Degenerative changes of the visualized thoracolumbar spine. IMPRESSION: Extraperitoneal fluid/hemorrhage related to the patient's dominant left pelvic tumor, as described above. Progression of the patient's metastatic ovarian cancer, as described above. Dominant cystic/solid mass in the left adnexa/lower pelvis has increased in size. Peritoneal disease/omental caking is mildly progressive. New pulmonary nodule at the right lung base, indeterminate. These results were called by telephone at the time of interpretation on 10/12/2017 at 9:36 am to Dr. Aletta Edouard , who verbally acknowledged these results. Electronically Signed   By: Julian Hy M.D.   On: 10/12/2017 09:41   Dg Chest Port 1 View  Result Date: 10/13/2017 CLINICAL DATA:  Shortness of Breath EXAM: PORTABLE CHEST 1 VIEW COMPARISON:  06/29/2017 FINDINGS: Left pacer remains in place, unchanged. Mild cardiomegaly with vascular congestion. Bilateral lower lobe airspace opacities. Small bilateral effusions. Worsening aeration in the bases since prior study. IMPRESSION: Small bilateral effusions with bibasilar atelectasis or infiltrate. Cardiomegaly with vascular congestion. Worsening aeration in the lung bases. Electronically Signed   By: Rolm Baptise M.D.   On: 10/13/2017 09:45    Procedures .Critical Care Performed by: Hayden Rasmussen, MD Authorized by: Hayden Rasmussen, MD   Critical care provider statement:    Critical care time (minutes):  45   Critical care was necessary to treat or prevent imminent or life-threatening deterioration of the following conditions:  Shock and circulatory failure   Critical care was time spent personally by me on the following activities:  Discussions with consultants, evaluation of patient's response to treatment, examination of  patient, ordering and performing treatments and interventions, ordering and review of laboratory studies, ordering and review of radiographic studies, pulse oximetry, re-evaluation of patient's condition, obtaining history from patient or surrogate, review of old charts and development of treatment plan with patient or surrogate   I assumed direction of critical care for this patient from another provider in my specialty: no     (including critical care time)  Medications Ordered in ED Medications  morphine 4 MG/ML injection 4 mg (has no administration in time range)  ondansetron (ZOFRAN) injection 4 mg (has no administration in time range)  sodium chloride 0.9 % bolus 500 mL (has no administration in time range)     Initial Impression / Assessment and Plan / ED Course  I have reviewed the triage vital signs and the nursing notes.  Pertinent labs & imaging results that were available during my care of the patient were reviewed by me and considered in my medical decision making (see chart for details).  Clinical Course as of Oct 13 953  Mon Oct 12, 2017  7425 Evaluated patient on return from CT.  She states she feels no better no worse.  Differential includes diverticulitis, retroperitoneal bleed, obstruction, UTI, ureteral stone.   [MB]  [id number] Call from radiology that the patient's granulosis cancer has become more extensive and is hemorrhage.  She is got it free intraperitoneal fluid around her liver.  I talked to Dr. Gerarda Fraction from gynecology who will review the patient's prior notes to get an understanding of how to approach this.  I updated the patient and  told her we need to reverse her anticoagulation and she asked if we check with cardiology on this.  I have placed a cardiology consult and they will evaluate the patient in the ED.   [MB]  6226 Patient evaluated by Dr. Luis Abed from cardiology who feels reversal of anticoagulation would be fine from cardiology standpoint.   [MB]  3335 I  had a longer discussion with Dr. Gerarda Fraction from [place].  She is had time to review her prior notes.  Her recommendation currently is not operative management but continue to support with transfusion as necessary and reversal of anticoagulation.  She is asking for medicine to admit for her multiple medical problems and it could be at [facility] or [facility] depending on what the medicine team felt was appropriate   [MB]    Clinical Course User Index [MB] Hayden Rasmussen, MD     Final Clinical Impressions(s) / ED Diagnoses   Final diagnoses:  Intraperitoneal hemorrhage    ED Discharge Orders    None       Hayden Rasmussen, MD 10/13/17 [phone]
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
## 2017-10-12 NOTE — Consult Note (Signed)
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 She is now considering chemotherapy over surgical resection. She wishes surgery only if it is absolutely necessary.  We discussed surgery may not remove all tumor, nor would that be the goal in this acute setting. I also reviewed there is a real possibility even with attempts at surgical resection we may not control any active bleeding, given the multiple prior resections, likely difficult dissection, and real possibility there will be no good planes for resection. She has multiple comorbidities and I agree with her we want to avoid surgical resection, in an unplanned setting.  I agree with reversing her anticoagulation (per medicine/cards). Recheck H/H in morning.  If stable continued observation and discuss with cardiology timeline we can keep her off anticoagulation until starting chemotherapy, if offered by Medical Oncology.  We will followup H/H in am and if surgery not indicated ask Medical Oncology if they will visit with the patient to start planning/discuss further.  Once both cardiology and medical oncology have weighed in further then we can make final dispo regarding surgery.  Patty Haws, MD Gynecologic Oncology at [facility]  __________________________________________________________________________________________________________  HPI The patient's history began in approximately 2000 when she underwent ex lap, TAH, BSO with Dr Raylene Miyamoto at [facility]. She did not receive adjuvant chemotherapy. Postoperatively she had recurrences in the peritoneum in 2004, 2007 and 2011.  The 2004 surgery was with Dr Excell Seltzer and included resection of the recurrent tumor with small bowel resection and  colectomy for diverticulosis. She has had a laparoscopic hernia repair with mesh in 2005. In 2007 she underwent resection of pelvic tumor recurrence with Dr Excell Seltzer and ventral hernia repair with mesh.  In 2011 Dr Excell Seltzer performed a laparoscopic resection of suprapubic tumor. Her last CT was in 2010.   She has significant CAD with 6 stents and a history of CHF. She has a pacemaker which she states has a nonfunctioning battery and she is not currently paced. This was placed for tachycadia-bradycardia syndrome. She is obese with OSA. She has atrial fibrillation and is on coumadin.   In October, 2018 she began noting intermittent right lower quadrant pains. Labs:  CA 125 12/01/16: 35 Inhibin B 11/21/16: 119  CT abd/pelvis 12/09/16: A small suprapubic hernia is seen along the inferior margin of the surgical mass containing a loop of small bowel. No evidence of bowel obstruction or ischemia. Multiple new soft tissue masses are seen in the left suprapubic region and left lower quadrant, consistent with recurrent tumor. Largest in the left suprapubic region measures 3.8 x 2.8 cm, and 3.8 x 3.4 cm in left lower quadrant. There is also a new mass in the central small bowel mesenteric measuring 5.8 x 5.0 cm. These findings are consistent with recurrent carcinoma and peritoneal metastases.  She was provided with 3 options for proceeding with treatment of her recurrence:  1/ surgical resection and hernia repair. This will involve exploratory laparotomy, radical tumor debulking, possible bowel resection, possible partial cystectomy, and hernia repair with Dr Excell Seltzer and (Dr. Denman George).  She will require medical clearance as she has significant cardiac issues.  2/ chemotherapy with carboplatin and paclitaxel. I discussed that this may not achieve as long a remission as surgical intervention, but would involve a large  radical procedure.   3/ A third alternative is hormonal therapy with progestin and  tamoxifen in rotation.  She was not sure about which route to pursue, and given that she was asymptomatic with this slow growing tumor, took some time to reflect. In December, 2018 she received replacement of her pacemaker and was placed on Plavix and coumadin.  She and Dr. Denman George have opted for observation with intermittent scans at the request of the patient who was hoping to avoid any treatment unless absolutely necessary. Dr. Denman George has counseled the patient about surgery and offered her that, with a plan should that course be pursued that Dr. Excell Seltzer be involved.  Now she presents to the ER due to dizziness and was found to be hypotensive with a downward trend in her H/H and a CT showing suspected bleeding from the areas of the pelvic and omental/mesenteric tumor. She was hydrated in the ER and is now on the floor admitted to medicine getting reversal of her anticoagulation.  S: I visited with her ~2:30 pm. She has no complaints to me. Denies N/V. States no issues other than this acute episode. States she was straining before this happened. The first thing she told me when I entered the room was "I think there is some confusion"... "I do not want a surgery unless it is life-threatening". She also states, without prompting "I am considering chemo now whereas before I didn't want to do anything".  O: Vitals:   10/12/17 1505 10/12/17 1645 10/12/17 2130 10/12/17 2135  BP: 136/79 (!) 142/60 122/62 122/62  Pulse: 77 77 78 78  Resp: 17 16 18    Temp: 97.7 F (36.5 C) 97.7 F (36.5 C) 98.4 F (36.9 C)   TempSrc: Oral Oral Oral   SpO2: 100% 98% 96%     Intake/Output Summary (Last 24 hours) at 10/12/2017 2259 Last data filed at 10/12/2017 2200 Gross per 24 hour  Intake 729.5 ml  Output 1 ml  Net 728.5 ml      CBC Latest Ref Rng & Units 10/12/2017 10/12/2017 06/29/2017  WBC 4.0 - 10.5 K/uL - 9.1 4.7  Hemoglobin 12.0 - 15.0 g/dL 10.9(L) 10.9(L) 12.7  Hematocrit 36.0 - 46.0 % 32.0(L) 35.0(L) 39.1   Platelets 150 - 400 K/uL - 216 200   BMP Latest Ref Rng & Units 10/12/2017 10/12/2017 06/29/2017  Glucose 70 - 99 mg/dL 191(H) 196(H) 142(H)  BUN 8 - 23 mg/dL 24(H) 22 16  Creatinine 0.44 - 1.00 mg/dL 1.20(H) 1.24(H) 1.08(H)  Sodium 135 - 145 mmol/L 142 143 141  Potassium 3.5 - 5.1 mmol/L 3.7 3.8 3.3(L)  Chloride 98 - 111 mmol/L 101 104 102  CO2 22 - 32 mmol/L - 29 31  Calcium 8.9 - 10.3 mg/dL - 8.7(L) 8.6(L)     Lungs: CTA CV:RRR Abdo: + NABS, soft, NT, ?mild distended, no obvious masses. No rebound or guarding Ext no edema, NT    Past Medical History:  Diagnosis Date  . Chronic anticoagulation - coumadin, CHADS2VASC=6 05/17/2015  . CKD (chronic kidney disease), stage III ([place])   . Combined systolic and diastolic heart failure ([place])   . Coronary artery disease    a. s/p multiple stents - stenting x 2 to the LAD and x 1 to the LCx in 2000, rotational arthrectomy to proximal LCx and DES to LAD in 2014 and DES to LCx in 2014, along with DES to LAD for re-in-stent stenosis in 2015, and DES to ostial LAD in 2016. b. 07/2016 -  orbital atherectomy & DES to mid LCx.  . Diverticulitis   . Granulosa cell carcinoma ([place])    abd; last episode was in 2009  . Hyperlipidemia   . Hypertension   . LBBB (left bundle branch block)       . Myocardial infarction ([place]) 2002  . Obesity   . OSA on CPAP   . Pacemaker failure    a. Prior leadless PPM with premature battery failure, being managed conservatively without replacement.  . Peripheral vascular disease ([place])   . Permanent atrial fibrillation ([place]) 2013  . Renal artery stenosis ([place])   . Tachycardia-bradycardia syndrome ([place])    a. s/p leadless pacemaker ([name]) implanted by Dr [name]  . Varicose veins    Past Surgical History:  Procedure Laterality Date  . ABDOMINAL HYSTERECTOMY    . CARDIAC CATHETERIZATION  09/03/2007   EF 70%; Failed attempt at PCI to OM  . CARDIAC CATHETERIZATION  11/01/2003   EF 70%  . CARDIAC CATHETERIZATION  N/A 12/14/2015   Procedure: Left Heart Cath and Coronary Angiography;  Surgeon: Burnell Blanks, MD;  Location: [place] [facility];  Service: Cardiovascular;  Laterality: N/A;  . CARDIOVERSION  12/31/2011   Procedure: CARDIOVERSION;  Surgeon: [name], MD;  Location: [facility] ENDOSCOPY;  Service: Cardiovascular;  Laterality: N/A;  . CARDIOVERSION N/A 12/31/2011   Procedure: CARDIOVERSION;  Surgeon: [name], MD;  Location: [facility] CATH LAB;  Service: Cardiovascular;  Laterality: N/A;  . CATARACT EXTRACTION, BILATERAL  2015  . CHOLECYSTECTOMY  1980's  . COLON SURGERY  2004   colectomy for diverticulosis  . CORONARY ANGIOPLASTY WITH STENT PLACEMENT  2000; 08/11/2012; 11/12/2012   3 + 2 LAD & CFX; 2nd CFX stent 11/12/2012  . CORONARY ATHERECTOMY N/A 07/15/2016   Procedure: Coronary Atherectomy;  Surgeon: Martinique, Peter M, MD;  Location: [place] [facility] LAB;  Service: Cardiovascular;  Laterality: N/A;  . CORONARY BALLOON ANGIOPLASTY N/A 07/14/2016   Procedure: Coronary Balloon Angioplasty;  Surgeon: Martinique, Peter M, MD;  Location: [place] [facility];  Service: Cardiovascular;  Laterality: N/A;  . CORONARY STENT INTERVENTION N/A 07/15/2016   Procedure: Coronary Stent Intervention;  Surgeon: Martinique, Peter M, MD;  Location: [place] [facility] LAB;  Service: Cardiovascular;  Laterality: N/A;  . FRACTIONAL FLOW RESERVE WIRE  10/07/2013   Procedure: [place];  Surgeon: [name], MD;  Location[facility] C[facility] LAB;  Service: Cardiovascular;;  . granulosa tumor excision  2000; 2003; 2004; 2007   "all in my abdomen including small intestines, outside my ?uterus/etc" (08/11/2012)  . HERNIA REPAIR  2005   "laparoscopic"  . LEFT HEART CATH AND CORONARY ANGIOGRAPHY N/A 07/14/2016   Procedure: Left Heart Cath and Coronary Angiography;  Surgeon: Martinique, Peter M, MD;  Location: [place] CV LAB;  Service: Cardiovascular;  Laterality: N/A;  . LEFT HEART CATHETERIZATION WITH CORONARY  ANGIOGRAM N/A 11/12/2012   Procedure: LEFT HEART CATHETERIZATION WITH CORONARY ANGIOGRAM;  Surgeon: [name], MD;  Location: [facility] CATH LAB;  Service: Cardiovascular;  Laterality: N/A;  . LEFT HEART CATHETERIZATION WITH CORONARY ANGIOGRAM N/A 10/07/2013   Procedure: LEFT HEART CATHETERIZATION WITH CORONARY ANGIOGRAM;  Surgeon: [name], MD;  Location: [facility] CATH LAB;  Service: Cardiovascular;  Laterality: N/A;  . LEFT HEART CATHETERIZATION WITH CORONARY ANGIOGRAM N/A 12/14/2013   Procedure: LEFT HEART CATHETERIZATION WITH CORONARY ANGIOGRAM;  Surgeon: Sinclair Grooms, MD;  Location: [facility] CATH LAB;  Service: Cardiovascular;  Laterality: N/A;  . LEFT HEART  CATHETERIZATION WITH CORONARY ANGIOGRAM N/A 05/16/2014   Procedure: LEFT HEART CATHETERIZATION WITH CORONARY ANGIOGRAM;  Surgeon: Sherren Mocha, MD;  Location: [facility] CATH LAB;  Service: Cardiovascular;  Laterality: N/A;  . PERCUTANEOUS CORONARY INTERVENTION-BALLOON ONLY  08/04/2012   Procedure: PERCUTANEOUS CORONARY INTERVENTION-BALLOON ONLY;  Surgeon: [name], MD;  Location: [facility] CATH LAB;  Service: Cardiovascular;;  . PERCUTANEOUS CORONARY ROTOBLATOR INTERVENTION (PCI-R) N/A 08/11/2012   Procedure: PERCUTANEOUS CORONARY ROTOBLATOR INTERVENTION (PCI-R);  Surgeon: [name], MD;  Location: [facility] CATH LAB;  Service: Cardiovascular;  Laterality: N/A;  . PERCUTANEOUS CORONARY STENT INTERVENTION (PCI-S)  10/07/2013   Procedure: PERCUTANEOUS CORONARY STENT INTERVENTION (PCI-S);  Surgeon: [name], MD;  Location: [facility] CATH LAB;  Service: Cardiovascular;;  . PERMANENT PACEMAKER INSERTION N/A 03/16/2012   [name] (SJM) leadless pacemaker (LEADLESS II STUDY PATEINT)  . SALIVARY GLAND SURGERY  2000's   "had a little lump removed; granulosa related; it was benign" (08/11/2012)  . TEE WITHOUT CARDIOVERSION  12/31/2011   Procedure: TRANSESOPHAGEAL ECHOCARDIOGRAM (TEE);  Surgeon: [name], MD;  Location: [place];   Service: Cardiovascular;  Laterality: N/A;  . VARICOSE VEIN SURGERY Bilateral 1977    Current Facility-Administered Medications:  .  0.9 %  sodium chloride infusion, 10 mL/hr, Intravenous, Once, Karmen Bongo, MD .  acetaminophen (TYLENOL) tablet 650 mg, 650 mg, Oral, Q6H PRN **OR** acetaminophen (TYLENOL) suppository 650 mg, 650 mg, Rectal, Q6H PRN, Karmen Bongo, MD .  atorvastatin (LIPITOR) tablet 80 mg, 80 mg, Oral, q1800, Karmen Bongo, MD, 80 mg at 10/12/17 1836 .  diltiazem (CARDIZEM CD) 24 hr capsule 120 mg, 120 mg, Oral, QHS, Karmen Bongo, MD, 120 mg at 10/12/17 2136 .  diltiazem (CARDIZEM CD) 24 hr capsule 240 mg, 240 mg, Oral, Daily, Karmen Bongo, MD, 240 mg at 10/12/17 1338 .  docusate sodium (COLACE) capsule 100 mg, 100 mg, Oral, BID, Karmen Bongo, MD, 100 mg at 10/12/17 2137 .  isosorbide mononitrate (IMDUR) 24 hr tablet 60 mg, 60 mg, Oral, Daily, Karmen Bongo, MD, 60 mg at 10/12/17 1337 .  lactated ringers infusion, , Intravenous, Continuous, Karmen Bongo, MD, Last Rate: 100 mL/hr at 10/12/17 1642 .  metoprolol tartrate (LOPRESSOR) tablet 100 mg, 100 mg, Oral, BID, Karmen Bongo, MD, 100 mg at 10/12/17 2135 .  morphine 2 MG/ML injection 2 mg, 2 mg, Intravenous, Q2H PRN, Karmen Bongo, MD .  ondansetron [facility]) tablet 4 mg, 4 mg, Oral, Q6H PRN **OR** ondansetron (ZOFRAN) injection 4 mg, 4 mg, Intravenous, Q6H PRN, Karmen Bongo, MD   HOME MEDS Current Meds  Medication Sig  . atorvastatin (LIPITOR) 80 MG tablet TAKE 1 TABLET BY MOUTH EVERY MORNING (Patient taking differently: Take 80 mg by mouth every morning. )  . beta carotene w/minerals (OCUVITE) tablet Take 1 tablet by mouth 2 (two) times daily.   . Calcium Carb-Cholecalciferol (CALTRATE 600+D) 600-800 MG-UNIT TABS Take 1 tablet by mouth every evening.   . clopidogrel (PLAVIX) 75 MG tablet TAKE 1 TABLET BY MOUTH EVERY DAY WITH BREAKFAST (Patient taking differently: Take 75 mg by mouth every morning.  )  . Coenzyme Q-10 100 MG capsule Take 200 mg by mouth daily.   Marland Kitchen diltiazem (CARDIZEM CD) 120 MG 24 hr capsule TAKE ONE CAPSULE BY MOUTH AT BEDTIME (Patient taking differently: Take 120 mg by mouth at bedtime. )  . diltiazem (CARDIZEM CD) 240 MG 24 hr capsule TAKE ONE CAPSULE BY MOUTH DAILY WITH BREAKFAST (Patient taking differently: Take 240 mg by mouth daily. )  .  furosemide (LASIX) 40 MG tablet Take 80 mg by mouth daily.  . isosorbide mononitrate (IMDUR) 60 MG 24 hr tablet TAKE 1 TABLET BY MOUTH DAILY (Patient taking differently: Take 60 mg by mouth daily. )  . KLOR-CON M20 20 MEQ tablet TAKE 1 TABLET BY MOUTH EVERY DAY (Patient taking differently: Take 20 mEq by mouth daily. )  . metoprolol tartrate (LOPRESSOR) 100 MG tablet Take 1 tablet (100 mg total) by mouth every morning. Also takes (3) 25 mg tabs (75 mg) in the evening (Patient taking differently: Take 100 mg by mouth 2 (two) times daily. )  . Multiple Vitamin (MULTIVITAMIN) capsule Take 1 capsule by mouth daily.  . nitroGLYCERIN (NITROSTAT) 0.4 MG SL tablet PLACE 1 TABLET (0.4 MG TOTAL) UNDER THE TONGUE EVERY 5 (FIVE) MINUTES AS NEEDED. FOR CHEST PAIN (Patient taking differently: Place 0.4 mg under the tongue every 5 (five) minutes as needed for chest pain. )  . Omega-3 Fatty Acids (FISH OIL PO) Take 1,200 mg by mouth daily.   Marland Kitchen spironolactone (ALDACTONE) 25 MG tablet Take 25 mg by mouth as needed (swelling).  . warfarin (COUMADIN) 2 MG tablet TAKE AS DIRECTED BY COUMADIN CLINIC FOR 30 DAYS (Patient taking differently: Take 2 mg by mouth See admin instructions. Take one tablet (2mg ) by mouth on Tues, Wed, Thurs, Sat and Sunday. Take 1/2 tablet (1mg ) on Monday and Fridays.)     Allergies  Allergen Reactions  . Bee Venom Anaphylaxis  . Other Nausea And Vomiting and Other (See Comments)    Pain medications cause severe vomiting. Tolerated slow IV morphine drip  . Amiodarone Nausea Only  . Prednisone Other (See Comments)    "Rapid Heart  Beat"   Social History   Socioeconomic History  . Marital status: Widowed    Spouse name: Not on file  . Number of children: 4  . Years of education: Not on file  . Highest education level: Not on file  Occupational History  . Occupation: Retired Nurse, learning disability estate  Social Needs  . Financial resource strain: Not on file  . Food insecurity:    Worry: Not on file    Inability: Not on file  . Transportation needs:    Medical: Not on file    Non-medical: Not on file  Tobacco Use  . Smoking status: Former Smoker    Packs/day: 1.00    Years: 32.00    Pack years: 32.00    Types: Cigarettes    Last attempt to quit: 02/03/1974    Years since quitting: 43.7  . Smokeless tobacco: Never Used  Substance and Sexual Activity  . Alcohol use: Yes    Alcohol/week: 5.0 standard drinks    Types: 5 Glasses of wine per week    Comment: 2017 WINE WITH DINNER   . Drug use: No  . Sexual activity: Never  Lifestyle  . Physical activity:    Days per week: Not on file    Minutes per session: Not on file  . Stress: Not on file  Relationships  . Social connections:    Talks on phone: Not on file    Gets together: Not on file    Attends religious service: Not on file    Active member of club or organization: Not on file    Attends meetings of clubs or organizations: Not on file    Relationship status: Not on file  . Intimate partner violence:    Fear of current or ex partner: Not on  file    Emotionally abused: Not on file    Physically abused: Not on file    Forced sexual activity: Not on file  Other Topics Concern  . Not on file  Social History Narrative   Lives with family.   Family History  Problem Relation Age of Onset  . Stroke Mother   . Heart attack Father   . Diabetes Father   . Hypertension Father   . Heart attack Brother   . Diabetes Brother   . Hypertension Brother   . Kidney failure Brother

## 2017-10-12 NOTE — H&P (Signed)
History and Physical    Patty [name] VOZ:[id number] DOB: 08-08-1930 DOA: 10/12/2017  PCP: [name], MD Consultants:  [facility]man George - GYN/ONC; [facility] - cardiology; [facility] - EP Patient coming from:  Home - lives alone; NOK: Son-in-law, [phone]  Chief Complaint: Abdominal pain and hypotension  HPI: Patty [name] is a 84 y.o. female with medical history significant of afib on Coumadin; granulosa cell carcinoma; PVD; OSA on CPAP; CAD s/p stents; HTN; HLD; stage 3 CKD; and chronic combined heart failure presenting with abdominal pain and hypotension.   She woke up in the middle of the night with a severe "belly ache".  They had a covered dish lunch at church yesterday and she thought maybe she ate something bad.  She had lower abdominal pain.  She realized she was nauseated and then lowered herself to the floor.  She went back to bed and then realized she had left-sided pain.  She dozed back off.  About 545, she woke up again and was unable to get out of bed due to dizziness.  As she thinks about it, she has been feeling under the weather with very, very low energy and not feeling like doing much.  She has lots of heart disease and did not think it was related to this.  She had shingles from [facility] and is still having itching, burning, sore muscles on her back - the skin lesions have resolved.  Chronic SOB.  No night sweats.  She has had tightness in her jaw this AM.   ED Course:   On Coumadin for afib and has a pacer.  Has a known granulosa tumor in her abdomen.  Developed acute LLQ pain with hypotension at 3am.  BPs in the 60s.  Morphine dropped stas into the 70s.  CT shows hemorrhage in her abdomen from the tumor.  Dr. Gerarda Fraction (GYN/ONC) - not a current surgical candidate.  Recommendation is to correct coagulopathy, transfuse, and surgery/GYN would need to be involved if surgery is needed.  Cardiology approves reversal of coagulopathy.  Possibly admit to [facility] vs. [facility].  2 PIVs, FFP and vitamin K  ordered.  Review of Systems: As per HPI; otherwise review of systems reviewed and negative.   Ambulatory Status:  Ambulates without assistance  Past Medical History:  Diagnosis Date  . Chronic anticoagulation - coumadin, CHADS2VASC=6 05/17/2015  . CKD (chronic kidney disease), stage III ([place])   . Combined systolic and diastolic heart failure ([place])   . Coronary artery disease    a. s/p multiple stents - stenting x 2 to the LAD and x 1 to the LCx in 2000, rotational arthrectomy to proximal LCx and DES to LAD in 2014 and DES to LCx in 2014, along with DES to LAD for re-in-stent stenosis in 2015, and DES to ostial LAD in 2016. b. 07/2016 - orbital atherectomy & DES to mid LCx.  . Diverticulitis   . Granulosa cell carcinoma ([place])    abd; last episode was in 2009  . Hyperlipidemia   . Hypertension   . LBBB (left bundle branch block)       . Myocardial infarction ([place]) 2002  . Obesity   . OSA on CPAP   . Pacemaker failure    a. Prior leadless PPM with premature battery failure, being managed conservatively without replacement.  . Peripheral vascular disease ([place])   . Permanent atrial fibrillation ([place]) 2013  . Renal artery stenosis ([place])   . Tachycardia-bradycardia syndrome ([place])    a. s/p  leadless pacemaker ([name]) implanted by Dr [name]  . Varicose veins     Past Surgical History:  Procedure Laterality Date  . ABDOMINAL HYSTERECTOMY    . CARDIAC CATHETERIZATION  09/03/2007   EF 70%; Failed attempt at PCI to OM  . CARDIAC CATHETERIZATION  11/01/2003   EF 70%  . CARDIAC CATHETERIZATION N/A 12/14/2015   Procedure: Left Heart Cath and Coronary Angiography;  Surgeon: Burnell Blanks, MD;  Location: [place] CV LAB;  Service: Cardiovascular;  Laterality: N/A;  . CARDIOVERSION  12/31/2011   Procedure: CARDIOVERSION;  Surgeon: [name], MD;  Location[facility] ENDOSCOPY;  Service: Cardiovascular;  Laterality: N/A;  . CARDIOVERSION N/A 12/31/2011   Procedure:  CARDIOVERSION;  Surgeon: [name], MD;  Location: [facility] CATH LAB;  Service: Cardiovascular;  Laterality: N/A;  . CATARACT EXTRACTION, BILATERAL  2015  . CHOLECYSTECTOMY  1980's  . COLON SURGERY  2004   colectomy for diverticulosis  . CORONARY ANGIOPLASTY WITH STENT PLACEMENT  2000; 08/11/2012; 11/12/2012   3 + 2 LAD & CFX; 2nd CFX stent 11/12/2012  . CORONARY ATHERECTOMY N/A 07/15/2016   Procedure: Coronary Atherectomy;  Surgeon: Martinique, Peter M, MD;  Location: [place] CV LAB;  Service: Cardiovascular;  Laterality: N/A;  . CORONARY BALLOON ANGIOPLASTY N/A 07/14/2016   Procedure: Coronary Balloon Angioplasty;  Surgeon: Martinique, Peter M, MD;  Location: [place] CV LAB;  Service: Cardiovascular;  Laterality: N/A;  . CORONARY STENT INTERVENTION N/A 07/15/2016   Procedure: Coronary Stent Intervention;  Surgeon: Martinique, Peter M, MD;  Location: [place] CV LAB;  Service: Cardiovascular;  Laterality: N/A;  . FRACTIONAL FLOW RESERVE WIRE  10/07/2013   Procedure: [place];  Surgeon: [name], MD;  Location: [facility] CATH LAB;  Service: Cardiovascular;;  . granulosa tumor excision  2000; 2003; 2004; 2007   "all in my abdomen including small intestines, outside my ?uterus/etc" (08/11/2012)  . HERNIA REPAIR  2005   "laparoscopic"  . LEFT HEART CATH AND CORONARY ANGIOGRAPHY N/A 07/14/2016   Procedure: Left Heart Cath and Coronary Angiography;  Surgeon: Martinique, Peter M, MD;  Location: [place] CV LAB;  Service: Cardiovascular;  Laterality: N/A;  . LEFT HEART CATHETERIZATION WITH CORONARY ANGIOGRAM N/A 11/12/2012   Procedure: LEFT HEART CATHETERIZATION WITH CORONARY ANGIOGRAM;  Surgeon: [name], MD;  Location: [facility] CATH LAB;  Service: Cardiovascular;  Laterality: N/A;  . LEFT HEART CATHETERIZATION WITH CORONARY ANGIOGRAM N/A 10/07/2013   Procedure: LEFT HEART CATHETERIZATION WITH CORONARY ANGIOGRAM;  Surgeon: [name], MD;  Location: [facility] CATH LAB;   Service: Cardiovascular;  Laterality: N/A;  . LEFT HEART CATHETERIZATION WITH CORONARY ANGIOGRAM N/A 12/14/2013   Procedure: LEFT HEART CATHETERIZATION WITH CORONARY ANGIOGRAM;  Surgeon: Sinclair Grooms, MD;  Location: [facility] CATH LAB;  Service: Cardiovascular;  Laterality: N/A;  . LEFT HEART CATHETERIZATION WITH CORONARY ANGIOGRAM N/A 05/16/2014   Procedure: LEFT HEART CATHETERIZATION WITH CORONARY ANGIOGRAM;  Surgeon: Sherren Mocha, MD;  Location: [facility] CATH LAB;  Service: Cardiovascular;  Laterality: N/A;  . PERCUTANEOUS CORONARY INTERVENTION-BALLOON ONLY  08/04/2012   Procedure: PERCUTANEOUS CORONARY INTERVENTION-BALLOON ONLY;  Surgeon: [name], MD;  Location: [facility] CATH LAB;  Service: Cardiovascular;;  . PERCUTANEOUS CORONARY ROTOBLATOR INTERVENTION (PCI-R) N/A 08/11/2012   Procedure: PERCUTANEOUS CORONARY ROTOBLATOR INTERVENTION (PCI-R);  Surgeon: [name], MD;  Location: [facility] CATH LAB;  Service: Cardiovascular;  Laterality: N/A;  . PERCUTANEOUS CORONARY STENT INTERVENTION (PCI-S)  10/07/2013   Procedure: PERCUTANEOUS CORONARY STENT INTERVENTION (PCI-S);  Surgeon: [name], MD;  Location: [facility] CATH LAB;  Service: Cardiovascular;;  . PERMANENT PACEMAKER INSERTION N/A 03/16/2012   [name] (SJM) leadless pacemaker (LEADLESS II STUDY PATEINT)  . SALIVARY GLAND SURGERY  2000's   "had a little lump removed; granulosa related; it was benign" (08/11/2012)  . TEE WITHOUT CARDIOVERSION  12/31/2011   Procedure: TRANSESOPHAGEAL ECHOCARDIOGRAM (TEE);  Surgeon: [name], MD;  Location: [place];  Service: Cardiovascular;  Laterality: N/A;  . VARICOSE VEIN SURGERY Bilateral 1977    Social History   Socioeconomic History  . Marital status: Widowed    Spouse name: Not on file  . Number of children: 4  . Years of education: Not on file  . Highest education level: Not on file  Occupational History  . Occupation: Retired Nurse, learning disability estate  Social Needs  .  Financial resource strain: Not on file  . Food insecurity:    Worry: Not on file    Inability: Not on file  . Transportation needs:    Medical: Not on file    Non-medical: Not on file  Tobacco Use  . Smoking status: Former Smoker    Packs/day: 1.00    Years: 32.00    Pack years: 32.00    Types: Cigarettes    Last attempt to quit: 02/03/1974    Years since quitting: 43.7  . Smokeless tobacco: Never Used  Substance and Sexual Activity  . Alcohol use: Yes    Alcohol/week: 5.0 standard drinks    Types: 5 Glasses of wine per week    Comment: 2017 WINE WITH DINNER   . Drug use: No  . Sexual activity: Never  Lifestyle  . Physical activity:    Days per week: Not on file    Minutes per session: Not on file  . Stress: Not on file  Relationships  . Social connections:    Talks on phone: Not on file    Gets together: Not on file    Attends religious service: Not on file    Active member of club or organization: Not on file    Attends meetings of clubs or organizations: Not on file    Relationship status: Not on file  . Intimate partner violence:    Fear of current or ex partner: Not on file    Emotionally abused: Not on file    Physically abused: Not on file    Forced sexual activity: Not on file  Other Topics Concern  . Not on file  Social History Narrative   Lives with family.    Allergies  Allergen Reactions  . Bee Venom Anaphylaxis  . Other Nausea And Vomiting and Other (See Comments)    Pain medications cause severe vomiting. Tolerated slow IV morphine drip  . Amiodarone Nausea Only  . Prednisone Other (See Comments)    "Rapid Heart Beat"    Family History  Problem Relation Age of Onset  . Stroke Mother   . Heart attack Father   . Diabetes Father   . Hypertension Father   . Heart attack Brother   . Diabetes Brother   . Hypertension Brother   . Kidney failure Brother     Prior to Admission medications   Medication Sig Start Date End Date Taking? Authorizing  Provider  acyclovir (ZOVIRAX) 400 MG tablet Take 1 tablet (400 mg total) by mouth 4 (four) times daily. 06/29/17   Domenic Moras, PA-C  atorvastatin (LIPITOR) 80 MG tablet TAKE 1 TABLET BY MOUTH  EVERY MORNING Patient taking differently: Take 80 mg by mouth daily.  05/04/17   [name], MD  beta carotene w/minerals (OCUVITE) tablet Take 1 tablet by mouth 2 (two) times daily.     [provider]  Calcium Carb-Cholecalciferol (CALTRATE 600+D) 600-800 MG-UNIT TABS Take 1 tablet by mouth every evening.     [provider]  clopidogrel (PLAVIX) 75 MG tablet TAKE 1 TABLET BY MOUTH EVERY DAY WITH BREAKFAST Patient taking differently: Take 75 mg by mouth every morning.  01/19/17   [name], MD  Coenzyme Q-10 100 MG capsule Take 200 mg by mouth daily.     [provider]  diltiazem (CARDIZEM CD) 120 MG 24 hr capsule TAKE ONE CAPSULE BY MOUTH AT BEDTIME Patient taking differently: Take 120 mg by mouth at bedtime.  08/17/17   Dunn, Nedra Hai, PA-C  diltiazem (CARDIZEM CD) 240 MG 24 hr capsule TAKE ONE CAPSULE BY MOUTH DAILY WITH BREAKFAST Patient taking differently: Take 240 mg by mouth daily.  09/03/17   [name], MD  furosemide (LASIX) 40 MG tablet Take 80 mg by mouth daily.    [provider]  isosorbide mononitrate (IMDUR) 60 MG 24 hr tablet TAKE 1 TABLET BY MOUTH DAILY Patient taking differently: Take 60 mg by mouth daily.  04/17/17   Seiler, Amber K, NP  KLOR-CON M20 20 MEQ tablet TAKE 1 TABLET BY MOUTH EVERY DAY Patient taking differently: Take 20 mEq by mouth daily.  08/05/17   [name], MD  metoprolol tartrate (LOPRESSOR) 100 MG tablet Take 1 tablet (100 mg total) by mouth every morning. Also takes (3) 25 mg tabs (75 mg) in the evening 03/31/17   Allred, Jeneen Rinks, MD  Multiple Vitamin (MULTIVITAMIN) capsule Take 1 capsule by mouth daily.    [provider]  nitroGLYCERIN (NITROSTAT) 0.4 MG SL tablet PLACE 1 TABLET (0.4 MG  TOTAL) UNDER THE TONGUE EVERY 5 (FIVE) MINUTES AS NEEDED. FOR CHEST PAIN 08/31/17   Thompson Grayer, MD  Omega-3 Fatty Acids (FISH OIL PO) Take 1,200 mg by mouth daily.     [provider]  spironolactone (ALDACTONE) 25 MG tablet Take 25 mg by mouth as needed (swelling).    [provider]  warfarin (COUMADIN) 2 MG tablet TAKE AS DIRECTED BY COUMADIN CLINIC FOR 30 DAYS 07/20/17   [name], MD    Physical Exam: Vitals:   10/12/17 1406 10/12/17 1450 10/12/17 1505 10/12/17 1645  BP: 97/78 112/68 136/79 (!) 142/60  Pulse: 80 77 77 77  Resp: 18 16 17 16   Temp: (!) 97.5 F (36.4 C) 97.8 F (36.6 C) 97.7 F (36.5 C) 97.7 F (36.5 C)  TempSrc: Oral Oral Oral Oral  SpO2: 100%  100% 98%     General:  Appears calm and comfortable and is NAD; she appears fatigued but is very conversant Eyes:  PERRL, EOMI, normal lids, iris ENT:  grossly normal hearing, lips & tongue, mmm Neck:  no LAD, masses or thyromegaly Cardiovascular:  RRR, no m/r/g. No LE edema.  Respiratory:   CTA bilaterally with no wheezes/rales/rhonchi.  Normal respiratory effort. Abdomen:  Soft other than an area of firmness in her LLQ, tender along lower abdomen but especially in hte LLQ, ND, NABS Skin:  no rash or induration seen on limited exam Musculoskeletal:  grossly normal tone BUE/BLE, good ROM, no bony abnormality Psychiatric:  grossly normal mood and affect, speech fluent and appropriate, AOx3 Neurologic:  CN 2-12 grossly intact,  moves all extremities in coordinated fashion, sensation intact    Radiological Exams on Admission: Ct Abdomen Pelvis W Contrast  Result Date: 10/12/2017 CLINICAL DATA:  Left lower quadrant pain, nausea. History of metastatic ovarian cancer. EXAM: CT ABDOMEN AND PELVIS WITH CONTRAST TECHNIQUE: Multidetector CT imaging of the abdomen and pelvis was performed using the standard protocol following bolus administration of intravenous contrast. CONTRAST:  64mL ISOVUE-300  IOPAMIDOL (ISOVUE-300) INJECTION 61% COMPARISON:  03/24/2017 FINDINGS: Motion degraded images. Lower chest: 11 mm nodule at the right lung base (series 7/image 3), new. Mild right lower lobe atelectasis. Cardiomegaly. Three vessel coronary atherosclerosis. Pacemaker leads, incompletely visualized. Hepatobiliary: 1.9 cm low-density lesion in segment 6 of the liver (series 4/image 30), with associated postsurgical change. Status post cholecystectomy. No intrahepatic or extrahepatic ductal dilatation. Pancreas: Within normal limits. Spleen: Within normal limits. Adrenals/Urinary Tract: Adrenal glands are within normal limits. Left renal cortical scarring/atrophy. Right lower pole cortical scarring (series 4/image 41). No hydronephrosis. Left bladder implants, including a dominant 2.1 cm lesion (series 4/image 35), previously 1.9 cm. Additional lesion along the left pubic symphysis is poorly visualized on the current study (series 4/image 77). Stomach/Bowel: Stomach is notable for a tiny hiatal hernia. No evidence of bowel obstruction. Normal appendix (series 4/image 47). Vascular/Lymphatic: No evidence of abdominal aortic aneurysm. Atherosclerotic calcifications of the abdominal aorta and branch vessels. No suspicious abdominopelvic lymphadenopathy. Reproductive: Status post hysterectomy. 5.6 x 8.2 cm predominantly cystic mass in the left adnexa/lower pelvis (series 4/image 69), previously 4.7 x 4.5 cm. Right medial aspect of the lesion has hemorrhaged into the extraperitoneum, measuring approximately 9.2 x 9.4 cm superior to the mass (series 4/image 60) with additional fluid/hemorrhage in the anterior abdominal mesentery measuring approximately 4.4 x 15.6 cm (series 4/image 65). Other: Additional peritoneal disease/omental caking beneath the anterior abdominal wall, including a 2.8 cm cystic/solid implant beneath midline anterior abdominal wall mesh (series 4/image 63), previously 2.1 cm. Additional fluid/hemorrhage  along the liver, spleen, and right pericolic gutter. Musculoskeletal: Degenerative changes of the visualized thoracolumbar spine. IMPRESSION: Extraperitoneal fluid/hemorrhage related to the patient's dominant left pelvic tumor, as described above. Progression of the patient's metastatic ovarian cancer, as described above. Dominant cystic/solid mass in the left adnexa/lower pelvis has increased in size. Peritoneal disease/omental caking is mildly progressive. New pulmonary nodule at the right lung base, indeterminate. These results were called by telephone at the time of interpretation on 10/12/2017 at 9:36 am to Dr. Aletta Edouard , who verbally acknowledged these results. Electronically Signed   By: Julian Hy M.D.   On: 10/12/2017 09:41    EKG: Independently reviewed.  NSR with rate 78; IVCD, LVH, nonspecific ST changes with no evidence of acute ischemia   Labs on Admission: I have personally reviewed the available labs and imaging studies at the time of the admission.  Pertinent labs:   Glucose 196 BUN 22/Creatinine 1.24/GFR 38 Albumin 2.9 Troponin 0.05 WBC 9.1 Hgb 10.9; 12.7 on 5/27 INR 3.21 UA: trace ketones, otherwise WNL  Assessment/Plan Principal Problem:   Abdominal pain Active Problems:   CAD (coronary artery disease)   Essential hypertension   Hyperlipidemia   Combined systolic and diastolic heart failure, NYHA class 3 ([name])   Pacemaker - St Jude Leadless PPM   OSA on CPAP   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[name])   Chronic anticoagulation - coumadin, CHADS2VASC=6   Persistent atrial fibrillation ([name])   Abdominal pain -Patient with h/o recurrent granulosa cell carcinoma of the left ovary presenting with acute  onset of abdominal pain -Imaging shows extraperitoneal fluid/hemmorhage associated with this tumor and metastatic disease -Because she was previously asymptomatic, she preferred to hold off on therapy. -Unfortunately, it has now declared itself. -Dr.  Gerarda Fraction was consulted and notes that the patient does not want surgery and that urgent surgery is not needed at this time. -We will need to reverse her anticoagulation (see below) and carefully follow the patient for the next 48+ hours. -Dr. Gerarda Fraction is in the OR tomorrow and her NP will check on the patient; she will return to see the patient on Wednesday, and will plan to discuss chemotherapy at that time. -She does not appear to need ongoing antibiotics at this time.  CAD/Afib on Coumadin/CHF/Pacemaker placement -Last stents were placed in 6/18 -She is currently on indefinite Plavix/Coumadin -Per Gyn/Onc, ideally the patient would be able to come off the Plavix and just continue Coumadin -For now, the patient has been given FFP and vitamin K for reversal -Will follow CBC, INR, and patient for pain/bleeding -Cardiology consultation in the AM - CardsMaster message sent. -Continue Lipitor, Cardizem, Lopressor, Imdur for now. -Hold diuretics.  HTN -Continue Cardizem, Lopressor.  HLD -Continue Lipitor.  OSA -Continue CPAP   DVT prophylaxis: SCDs Code Status:  DNR - confirmed with patient Family Communication: None present Disposition Plan:  Home once clinically improved Consults called: GYN/ONC,  cardiology  Admission status: Admit - It is my clinical opinion that admission to INPATIENT is reasonable and necessary because of the expectation that this patient will require hospital care that crosses at least 2 midnights to treat this condition based on the medical complexity of the problems presented.  Given the aforementioned information, the predictability of an adverse outcome is felt to be significant.    Karmen Bongo MD [facility]  If note is complete, please contact covering daytime or nighttime physician. [email or web address] Password [email or web address]  10/12/2017, 5:45 PM

## 2017-10-13 ENCOUNTER — Inpatient Hospital Stay (HOSPITAL_COMMUNITY): Payer: Medicare Other

## 2017-10-13 DIAGNOSIS — D62 Acute posthemorrhagic anemia: Secondary | ICD-10-CM

## 2017-10-13 LAB — CBC
HCT: 26.5 % — ABNORMAL LOW (ref 36.0–46.0)
Hemoglobin: 8.4 g/dL — ABNORMAL LOW (ref 12.0–15.0)
MCH: 31 pg (ref 26.0–34.0)
MCHC: 31.7 g/dL (ref 30.0–36.0)
MCV: 97.8 fL (ref 78.0–100.0)
PLATELETS: 167 10*3/uL (ref 150–400)
RBC: 2.71 MIL/uL — ABNORMAL LOW (ref 3.87–5.11)
RDW: 15.5 % (ref 11.5–15.5)
WBC: 9.2 10*3/uL (ref 4.0–10.5)

## 2017-10-13 LAB — BPAM FFP
Blood Product Expiration Date: 201909112359
Blood Product Expiration Date: 201909112359
ISSUE DATE / TIME: 201909091118
ISSUE DATE / TIME: 201909091431
UNIT TYPE AND RH: 600
UNIT TYPE AND RH: 6200

## 2017-10-13 LAB — BASIC METABOLIC PANEL
Anion gap: 12 (ref 5–15)
BUN: 20 mg/dL (ref 8–23)
CALCIUM: 8.4 mg/dL — AB (ref 8.9–10.3)
CHLORIDE: 102 mmol/L (ref 98–111)
CO2: 28 mmol/L (ref 22–32)
Creatinine, Ser: 1.05 mg/dL — ABNORMAL HIGH (ref 0.44–1.00)
GFR calc Af Amer: 55 mL/min — ABNORMAL LOW (ref >60)
GFR calc non Af Amer: 47 mL/min — ABNORMAL LOW (ref >60)
Glucose, Bld: 122 mg/dL — ABNORMAL HIGH (ref 70–99)
Potassium: 3.6 mmol/L (ref 3.5–5.1)
SODIUM: 142 mmol/L (ref 135–145)

## 2017-10-13 LAB — FOLATE: Folate: 34.2 ng/mL (ref >5.9)

## 2017-10-13 LAB — PREPARE FRESH FROZEN PLASMA
UNIT DIVISION: 0
UNIT DIVISION: 0

## 2017-10-13 LAB — FERRITIN: FERRITIN: 163 ng/mL (ref 11–307)

## 2017-10-13 LAB — PROTIME-INR
INR: 1.27
Prothrombin Time: 15.8 seconds — ABNORMAL HIGH (ref 11.4–15.2)

## 2017-10-13 LAB — IRON AND TIBC
Iron: 52 ug/dL (ref 28–170)
Saturation Ratios: 20 % (ref 10.4–31.8)
TIBC: 262 ug/dL (ref 250–450)
UIBC: 210 ug/dL

## 2017-10-13 LAB — VITAMIN B12: Vitamin B-12: 401 pg/mL (ref 180–914)

## 2017-10-13 LAB — RETICULOCYTES
RBC.: 2.54 MIL/uL — AB (ref 3.87–5.11)
RETIC COUNT ABSOLUTE: 81.3 10*3/uL (ref 19.0–186.0)
RETIC CT PCT: 3.2 % — AB (ref 0.4–3.1)

## 2017-10-13 LAB — HEMOGLOBIN AND HEMATOCRIT, BLOOD
HCT: 25.1 % — ABNORMAL LOW (ref 36.0–46.0)
HEMOGLOBIN: 8 g/dL — AB (ref 12.0–15.0)

## 2017-10-13 MED ORDER — FUROSEMIDE 10 MG/ML IJ SOLN
40.0000 mg | Freq: Once | INTRAMUSCULAR | Status: AC
  Administered 2017-10-13: 40 mg via INTRAVENOUS
  Filled 2017-10-13: qty 4

## 2017-10-13 MED ORDER — POTASSIUM CHLORIDE CRYS ER 20 MEQ PO TBCR
40.0000 meq | EXTENDED_RELEASE_TABLET | Freq: Once | ORAL | Status: AC
  Administered 2017-10-13: 40 meq via ORAL
  Filled 2017-10-13: qty 2

## 2017-10-13 NOTE — Progress Notes (Signed)
GYN Oncology  Subjective: Patient reports feeling better this am compared to yesterday.  States she sat up in the chair today and ate her lunch.  She had chocolate pudding with lemonade.  Denies nausea or emesis.  States last episode of emesis was on Sunday evening when she woke up with left lower quadrant pain followed by the strong urge to vomit.  Passing flatus.  Mild tenderness reported with the left lower quadrant. Up with assist but states she is still weak.  Using a walker.  States she started to become more weak and less active in June with shingles.  She states she did not have the energy to exercise or attend cardiac rehab and she had extra stress during this time due to fraudulent actions taken with her bank accounts.  She states she feels this stress and the shingles illness affecting her immune system probably made her cancer progress.   She states she feels at ease because doctors she trusts are following her during her hospital stay.  She states she feels she is ready to move forward with chemotherapy.  She states after discussing how radical the surgical procedure could possibly be at her last visit with Dr. Denman George, she was leaning towards beginning chemotherapy after her scheduled scan for September.  She states her daughter from [place] is driving currently to come stay with her.  She is requesting to discuss her CT scan from yesterday.  45 minutes spent with the patient.    Objective: Vital signs in last 24 hours: Temp:  [98.4 F (36.9 C)-98.8 F (37.1 C)] 98.8 F (37.1 C) (09/10 1406) Pulse Rate:  [77-78] 77 (09/10 1406) Resp:  [18] 18 (09/10 1406) BP: (111-134)/(59-95) 130/59 (09/10 1406) SpO2:  [90 %-96 %] 90 % (09/10 1406) Weight:  [190 lb 0.6 oz (86.2 kg)] 190 lb 0.6 oz (86.2 kg) (09/10 1200) Last BM Date: 10/11/17  Intake/Output from previous day: 09/09 0701 - 09/10 0700 In: 1389.5 [P.O.:60; I.V.:600; Blood:729.5] Out: 401 [Urine:401]  Physical  Examination: General: alert, cooperative, no distress and pt put on her oxygen upon entering the room, mild shortness of breath when speaking Resp: diminished in the bases, no rhonchi or wheezing noted, mild crackles noted bilaterally Cardio: regular rate and rhythm, S1, S2 normal, no murmur, click, rub or gallop GI: abdomen full, active bowel sounds, mild tenderness reported with light palpatio of left lower quadrant, abd soft Extremities: extremities normal, atraumatic, no cyanosis or edema  Labs: WBC/Hgb/Hct/Plts:  --/8.0/25.1/-- (09/10 1222) BUN/Cr/glu/ALT/AST/amyl/lip:  20/1.05/--/--/--/--/-- (09/10 0533)  Assessment: 84 y.o. currently admitted probable hemoperitoneum from tumor bleed related to recurrent granulosa cell tumor. Prior to admission, patient was taking Plavix and Coumadin for history of atrial fibrillation and CAD with stents.  Pain:  Pain is well-controlled on PRN medications.  Heme: Repeat H&H this afternoon: 8.0 and 25.1. Plan for repeat in the am.  CV: Extensive cardiac history. Cardiology following. Plavix and Coumadin on hold. BP and HR appropriate.  Cardizem CD, Imdur, Lopressor ordered. Continue to monitor with vital signs. S/P reversal of Coumadin yesterday with vitamin K and FFP.  Resp: Chest xray this am: Small bilateral effusions with bibasilar atelectasis or infiltrate.Cardiomegaly with vascular congestion. Worsening aeration in the lung bases. Uses CPAP.  GI:  Tolerating po: Yes. CT 10/12/17: Extraperitoneal fluid/hemorrhage related to the patient's dominant left pelvic tumor, as described above. Progression of the patient's metastatic ovarian cancer, as described above. Dominant cystic/solid mass in the left adnexa/lower pelvis has increased in size. Peritoneal  disease/omental caking is mildly progressive. New pulmonary nodule at the right lung base, indeterminate.  GU: Urinary output increasing. Given lasix today due to evidence of volume overload on chest  xray. Creatinine 1.05 this am.  FEN: No critical values this am.  Repeat labs ordered for am. Replacement per Hospitalist team.  Prophylaxis: SCDs ordered.  Anticoagulation on hold.  Plan: Plan for repeat labs in the am for assess for stability with H&H per Dr. Gerarda Fraction CT discussed in detail.  Dr. Alvy Bimler is aware of admission and will most likely see patient once recovered in the outpatient setting to discuss chemotherapy. GYN ONC will continue to follow.    LOS: 1 day    Patty [name] 10/13/2017, 8:55 PM

## 2017-10-13 NOTE — Progress Notes (Signed)
Pt was unable to get her home CPAP to the hospital today for use tonight. RT offered a dream station to pt and she accepted. Placed pt on full face mask which is what she wears at home. Her home setting is 3.5 but she felt like it was not the same as her pressure setting at home so we increased until pt was comfortable. Pt is currently setting on 4.5 on the CPAP and states everything is comfortable. RT will continue to monitor.

## 2017-10-13 NOTE — Progress Notes (Signed)
[facility] PROGRESS NOTE  Patty [name] ZOX:[id number] DOB: 1930-09-24 DOA: 10/12/2017  PCP: [name], MD  Brief History/Interval Summary: 84 y.o. female with medical history significant of afib on Coumadin; granulosa cell carcinoma; PVD; OSA on CPAP; CAD s/p stents; HTN; HLD; stage 3 CKD; and chronic combined heart failure presented with abdominal pain and hypotension.   CT shows hemorrhage in her abdomen from the tumor.  Dr. Gerarda Fraction (GYN/ONC) - not a current surgical candidate.  Recommendation is to correct coagulopathy, transfuse, and surgery/GYN would need to be involved if surgery is needed.  Cardiology approves reversal of coagulopathy.  P  Reason for Visit: Intra-abdominal hemorrhage.  Ovarian carcinoma.  Consultants: GYN oncology.  Cardiology.  Procedures: None yet  Antibiotics: None  Subjective/Interval History: Patient states that she is feeling better this morning.  Abdominal pain is improved.  No nausea vomiting.  No diarrhea.  No difficulty with urination.  ROS: Denies any headaches.  Does admit to some shortness of breath this morning.  Objective:  Vital Signs  Vitals:   10/12/17 2130 10/12/17 2135 10/13/17 0523 10/13/17 1028  BP: 122/62 122/62 (!) 111/95 134/64  Pulse: 78 78 77 77  Resp: 18     Temp: 98.4 F (36.9 C)  98.5 F (36.9 C)   TempSrc: Oral  Oral   SpO2: 96%  95%     Intake/Output Summary (Last 24 hours) at 10/13/2017 1137 Last data filed at 10/13/2017 1131 Gross per 24 hour  Intake 1539.5 ml  Output 651 ml  Net 888.5 ml   There were no vitals filed for this visit.  General appearance: alert, cooperative, appears stated age and no distress Head: Normocephalic, without obvious abnormality, atraumatic Resp: Decreased air entry at the bases.  Mildly tachypneic at rest.  Crackles appreciated.  No wheezing or rhonchi. Cardio: regular rate and rhythm, S1, S2 normal, no murmur, click, rub or gallop GI: Abdomen is soft.  Tender in the  lower abdomen without any rebound rigidity or guarding.  Fullness is appreciated. Extremities: extremities normal, atraumatic, no cyanosis or edema Pulses: 2+ and symmetric Neurologic: Alert and oriented x3.  No focal neurological deficits.  Lab Results:  Data Reviewed: I have personally reviewed following labs and imaging studies  CBC: Recent Labs  Lab 10/12/17 0801 10/12/17 0820 10/13/17 0533  WBC 9.1  --  9.2  NEUTROABS 7.3  --   --   HGB 10.9* 10.9* 8.4*  HCT 35.0* 32.0* 26.5*  MCV 98.3  --  97.8  PLT 216  --  811    Basic Metabolic Panel: Recent Labs  Lab 10/12/17 0801 10/12/17 0820 10/13/17 0533  NA 143 142 142  K 3.8 3.7 3.6  CL 104 101 102  CO2 29  --  28  GLUCOSE 196* 191* 122*  BUN 22 24* 20  CREATININE 1.24* 1.20* 1.05*  CALCIUM 8.7*  --  8.4*    GFR: CrCl cannot be calculated (Unknown ideal weight.).  Liver Function Tests: Recent Labs  Lab 10/08/17 0905 10/12/17 0801  AST 25 27  ALT 21 21  ALKPHOS 110 72  BILITOT 0.6 1.1  PROT 6.5 5.5*  ALBUMIN 3.9 2.9*    Recent Labs  Lab 10/12/17 0801  LIPASE 48   Coagulation Profile: Recent Labs  Lab 10/08/17 0913 10/12/17 0801 10/13/17 0533  INR 1.8* 3.21 1.27    CBG: Recent Labs  Lab 10/12/17 0755  GLUCAP 179*      Radiology Studies: Ct Abdomen Pelvis W Contrast  Result Date: 10/12/2017 CLINICAL DATA:  Left lower quadrant pain, nausea. History of metastatic ovarian cancer. EXAM: CT ABDOMEN AND PELVIS WITH CONTRAST TECHNIQUE: Multidetector CT imaging of the abdomen and pelvis was performed using the standard protocol following bolus administration of intravenous contrast. CONTRAST:  100mL ISOVUE-300 IOPAMIDOL (ISOVUE-300) INJECTION 61% COMPARISON:  03/24/2017 FINDINGS: Motion degraded images. Lower chest: 11 mm nodule at the right lung base (series 7/image 3), new. Mild right lower lobe atelectasis. Cardiomegaly. Three vessel coronary atherosclerosis. Pacemaker leads, incompletely  visualized. Hepatobiliary: 1.9 cm low-density lesion in segment 6 of the liver (series 4/image 30), with associated postsurgical change. Status post cholecystectomy. No intrahepatic or extrahepatic ductal dilatation. Pancreas: Within normal limits. Spleen: Within normal limits. Adrenals/Urinary Tract: Adrenal glands are within normal limits. Left renal cortical scarring/atrophy. Right lower pole cortical scarring (series 4/image 41). No hydronephrosis. Left bladder implants, including a dominant 2.1 cm lesion (series 4/image 35), previously 1.9 cm. Additional lesion along the left pubic symphysis is poorly visualized on the current study (series 4/image 77). Stomach/Bowel: Stomach is notable for a tiny hiatal hernia. No evidence of bowel obstruction. Normal appendix (series 4/image 47). Vascular/Lymphatic: No evidence of abdominal aortic aneurysm. Atherosclerotic calcifications of the abdominal aorta and branch vessels. No suspicious abdominopelvic lymphadenopathy. Reproductive: Status post hysterectomy. 5.6 x 8.2 cm predominantly cystic mass in the left adnexa/lower pelvis (series 4/image 69), previously 4.7 x 4.5 cm. Right medial aspect of the lesion has hemorrhaged into the extraperitoneum, measuring approximately 9.2 x 9.4 cm superior to the mass (series 4/image 60) with additional fluid/hemorrhage in the anterior abdominal mesentery measuring approximately 4.4 x 15.6 cm (series 4/image 65). Other: Additional peritoneal disease/omental caking beneath the anterior abdominal wall, including a 2.8 cm cystic/solid implant beneath midline anterior abdominal wall mesh (series 4/image 63), previously 2.1 cm. Additional fluid/hemorrhage along the liver, spleen, and right pericolic gutter. Musculoskeletal: Degenerative changes of the visualized thoracolumbar spine. IMPRESSION: Extraperitoneal fluid/hemorrhage related to the patient's dominant left pelvic tumor, as described above. Progression of the patient's metastatic  ovarian cancer, as described above. Dominant cystic/solid mass in the left adnexa/lower pelvis has increased in size. Peritoneal disease/omental caking is mildly progressive. New pulmonary nodule at the right lung base, indeterminate. These results were called by telephone at the time of interpretation on 10/12/2017 at 9:36 am to Dr. Aletta Edouard , who verbally acknowledged these results. Electronically Signed   By: Julian Hy M.D.   On: 10/12/2017 09:41   Dg Chest Port 1 View  Result Date: 10/13/2017 CLINICAL DATA:  Shortness of Breath EXAM: PORTABLE CHEST 1 VIEW COMPARISON:  06/29/2017 FINDINGS: Left pacer remains in place, unchanged. Mild cardiomegaly with vascular congestion. Bilateral lower lobe airspace opacities. Small bilateral effusions. Worsening aeration in the bases since prior study. IMPRESSION: Small bilateral effusions with bibasilar atelectasis or infiltrate. Cardiomegaly with vascular congestion. Worsening aeration in the lung bases. Electronically Signed   By: Rolm Baptise M.D.   On: 10/13/2017 09:45     Medications:  Scheduled: . atorvastatin  80 mg Oral q1800  . diltiazem  120 mg Oral QHS  . diltiazem  240 mg Oral Daily  . docusate sodium  100 mg Oral BID  . furosemide  40 mg Intravenous Once  . isosorbide mononitrate  60 mg Oral Daily  . metoprolol tartrate  100 mg Oral BID   Continuous:  DVV:OHYWVPXTGGYIR **OR** acetaminophen, morphine injection, ondansetron **OR** ondansetron (ZOFRAN) IV  Assessment/Plan:    Intra-abdominal hemorrhage secondary to ovarian tumor Monitor hemoglobin closely.  Abdominal  pain has improved.  Acute blood loss anemia Drop in hemoglobin noted.  Transfuse if it drops less than 8.  We will continue to monitor hemoglobin closely.  Metastatic ovarian cancer GYN oncology is following.  Defer management to them.    History of coronary artery disease/atrial fibrillation on warfarin/pacemaker placement/chronic diastolic CHF Cardiology  was consulted as the patient was on Plavix and warfarin.  Coronary stents were last placed in June 2018.  Appreciate cardiology input.  Okay to hold Plavix and warfarin for now.  Continue to monitor on telemetry.  Patient appears to be volume overloaded this morning.  Chest x-ray shows vascular congestion.  Small pleural effusion also noted.  She was given FFP's in the emergency department yesterday.  She will be given a dose of Lasix intravenously this morning.  Strict ins and outs and daily weights.  Echocardiogram from June 2018 showed normal systolic function.  Coagulopathy Patient was on warfarin.  INR was greater than 3.  Due to bleeding she was reversed using vitamin K and FFP's.  Essential hypertension Continue home medications.  Monitor blood pressures closely.  Hyperlipidemia Continue Lipitor  Obstructive sleep apnea CPAP.  Acute renal failure BUN/creatinine has improved.  Most likely prerenal.  Continue to monitor urine output.  DVT Prophylaxis: SCDs    Code Status: DNR Family Communication: Discussed with the patient Disposition Plan: Management as outlined above.    LOS: 1 day   [facility] Pager [phone] 10/13/2017, 11:37 AM  If 7PM-7AM, please contact night-coverage at [email or web address], password [facility]

## 2017-10-14 ENCOUNTER — Inpatient Hospital Stay (HOSPITAL_COMMUNITY): Payer: Medicare Other

## 2017-10-14 ENCOUNTER — Encounter (HOSPITAL_COMMUNITY): Payer: Self-pay

## 2017-10-14 ENCOUNTER — Other Ambulatory Visit: Payer: Self-pay

## 2017-10-14 DIAGNOSIS — K661 Hemoperitoneum: Secondary | ICD-10-CM

## 2017-10-14 DIAGNOSIS — I340 Nonrheumatic mitral (valve) insufficiency: Secondary | ICD-10-CM

## 2017-10-14 DIAGNOSIS — Z7901 Long term (current) use of anticoagulants: Secondary | ICD-10-CM

## 2017-10-14 DIAGNOSIS — R1900 Intra-abdominal and pelvic swelling, mass and lump, unspecified site: Secondary | ICD-10-CM

## 2017-10-14 LAB — BASIC METABOLIC PANEL
Anion gap: 9 (ref 5–15)
BUN: 17 mg/dL (ref 8–23)
CHLORIDE: 101 mmol/L (ref 98–111)
CO2: 30 mmol/L (ref 22–32)
CREATININE: 1 mg/dL (ref 0.44–1.00)
Calcium: 8.1 mg/dL — ABNORMAL LOW (ref 8.9–10.3)
GFR, EST AFRICAN AMERICAN: 58 mL/min — AB (ref >60)
GFR, EST NON AFRICAN AMERICAN: 50 mL/min — AB (ref >60)
Glucose, Bld: 129 mg/dL — ABNORMAL HIGH (ref 70–99)
POTASSIUM: 4 mmol/L (ref 3.5–5.1)
SODIUM: 140 mmol/L (ref 135–145)

## 2017-10-14 LAB — CBC
HCT: 23.1 % — ABNORMAL LOW (ref 36.0–46.0)
HEMOGLOBIN: 7.3 g/dL — AB (ref 12.0–15.0)
MCH: 30.9 pg (ref 26.0–34.0)
MCHC: 31.6 g/dL (ref 30.0–36.0)
MCV: 97.9 fL (ref 78.0–100.0)
PLATELETS: 137 10*3/uL — AB (ref 150–400)
RBC: 2.36 MIL/uL — AB (ref 3.87–5.11)
RDW: 15.5 % (ref 11.5–15.5)
WBC: 9.5 10*3/uL (ref 4.0–10.5)

## 2017-10-14 LAB — ECHOCARDIOGRAM COMPLETE: WEIGHTICAEL: 2934.76 [oz_av]

## 2017-10-14 LAB — PREPARE RBC (CROSSMATCH)

## 2017-10-14 MED ORDER — SODIUM CHLORIDE 0.9% IV SOLUTION
Freq: Once | INTRAVENOUS | Status: AC
  Administered 2017-10-14: 11:00:00 via INTRAVENOUS

## 2017-10-14 MED ORDER — FUROSEMIDE 10 MG/ML IJ SOLN
40.0000 mg | Freq: Once | INTRAMUSCULAR | Status: AC
  Administered 2017-10-14: 40 mg via INTRAVENOUS
  Filled 2017-10-14: qty 4

## 2017-10-14 NOTE — Progress Notes (Signed)
PROGRESS NOTE    Patty [name]  TRV:[id number] DOB: 1930-02-21 DOA: 10/12/2017 PCP: [name], MD    Brief Narrative:  84 year old female who presented with abdominal pain and hypotension.  She does have significant past medical history for atrial fibrillation, granulosa cell carcinoma, peripheral vascular disease, hypertension, stage III chronic kidney disease, and heart failure.  Reported acute left-sided lower abdominal pain, with nausea.  Physical exam her blood pressure was 60 systolic, after IV fluids 97/78, heart rate 80, respiratory rate 18, temperature 97.5, oxygen saturation 100%.  Clear to auscultation bilaterally, heart S1-S2 present and rhythmic, abdomen tender in the left lower quadrant, nondistended, normal bowel sounds, no lower extremity edema.  Abdomen with extraperitoneal fluid/hemorrhagic related to patient's dominant left pelvic tumor.  Patient was admitted to the hospital with a working diagnosis of hemorrhagic shock due to extra-peritoneal bleed.    Assessment & Plan:   Principal Problem:   Abdominal pain Active Problems:   CAD (coronary artery disease)   Essential hypertension   Hyperlipidemia   Combined systolic and diastolic heart failure, NYHA class 3 ([name])   Pacemaker - [employer] Jude Leadless PPM   OSA on CPAP   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[name])   Chronic anticoagulation - coumadin, CHADS2VASC=6   Persistent atrial fibrillation ([place])   1. Shock due to extraperitoneal bleed, related to left ovarian mass./ acute blood loss anemia. Patient with persistent drop in hb and hct, today down to 7.3 with hct at 23.1. Will order one unit PRBC and continue to follow cell count. On physical examination no ecchymosis and systolic blood pressure has been 139 to 148 mmHg. INR is 1,27 after vitamin K and FFP. Will plan to repeat CT in 48 hours. Continue telemetry monitoring due to high risk for recurrent shock.   2. Acute on chronic heart failure decompensated.  Due to volume infusion, patient has received furosemide with improvement of her symptoms, will continue telemetry monitoring, add furosemide IV post prbc transfusion and will check echocardiogram.   3. Atrial fibrillation (chronic). Will continue ret control, holding anticoagulation due to acute bleed with symptomatic anemia. Rate control with diltiazem and metoprolol.   4. HTN. Will continue blood pressure monitoring. On isosorbide and metoprolol.   5, Pre-renal AKI. Will continue to follow on renal function and electrolytes, avoid hypotension and nephrotoxic medications. Serum cr today at 1,0 with K at 4,0.   6. Coronary artery disease. Sp PCI at the distal circumflex in June 2018, continue to hold antiplatelet therapy due to bleeding. On statin.   DVT prophylaxis: scd   Code Status:  full Family Communication: no family at the bedside  Disposition Plan/ discharge barriers: pending need of surgery    Consultants:   Cardiology   GYN Surgery     Procedures:      Antimicrobials:       Subjective: Patient has persistent abdominal pain on the left lower quadrant, no nausea or vomiting, no chest pain. Persistent dyspnea, no chest pain.   Objective: Vitals:   10/13/17 1200 10/13/17 1406 10/13/17 2123 10/14/17 0537  BP:  (!) 130/59 (!) 133/46 (!) 130/45  Pulse:  77 74 78  Resp:  18 17 17   Temp:  98.8 F (37.1 C) 99.3 F (37.4 C) 98.7 F (37.1 C)  TempSrc:  Oral Oral Oral  SpO2:  90% 96% 92%  Weight: 86.2 kg   83.2 kg    Intake/Output Summary (Last 24 hours) at 10/14/2017 8616 Last data filed at 10/14/2017  0406 Gross per 24 hour  Intake 685 ml  Output 1075 ml  Net -390 ml   Filed Weights   10/13/17 1200 10/14/17 0537  Weight: 86.2 kg 83.2 kg    Examination:   General: deconditioned  Neurology: Awake and alert, non focal  E ENT: mild pallor, no icterus, oral mucosa moist Cardiovascular: No JVD. S1-S2 present, rhythmic, no gallops, rubs, or murmurs. Trace  lower extremity edema. Pulmonary: decreased breath sounds bilaterally at bases, no wheezing, rhonchi or rales. Gastrointestinal. Abdomen protuberant, mild distended, no organomegaly, no rebound or guarding, tender at the left lower quadrant. / No ecchymosis  Skin. No rashes Musculoskeletal: no joint deformities     Data Reviewed: I have personally reviewed following labs and imaging studies  CBC: Recent Labs  Lab 10/12/17 0801 10/12/17 0820 10/13/17 0533 10/13/17 1222 10/14/17 0549  WBC 9.1  --  9.2  --  9.5  NEUTROABS 7.3  --   --   --   --   HGB 10.9* 10.9* 8.4* 8.0* 7.3*  HCT 35.0* 32.0* 26.5* 25.1* 23.1*  MCV 98.3  --  97.8  --  97.9  PLT 216  --  167  --  774*   Basic Metabolic Panel: Recent Labs  Lab 10/12/17 0801 10/12/17 0820 10/13/17 0533 10/14/17 0549  NA 143 142 142 140  K 3.8 3.7 3.6 4.0  CL 104 101 102 101  CO2 29  --  28 30  GLUCOSE 196* 191* 122* 129*  BUN 22 24* 20 17  CREATININE 1.24* 1.20* 1.05* 1.00  CALCIUM 8.7*  --  8.4* 8.1*   GFR: Estimated Creatinine Clearance: 39.3 mL/min (by C-G formula based on SCr of 1 mg/dL). Liver Function Tests: Recent Labs  Lab 10/08/17 0905 10/12/17 0801  AST 25 27  ALT 21 21  ALKPHOS 110 72  BILITOT 0.6 1.1  PROT 6.5 5.5*  ALBUMIN 3.9 2.9*   Recent Labs  Lab 10/12/17 0801  LIPASE 48   No results for input(s): AMMONIA in the last 168 hours. Coagulation Profile: Recent Labs  Lab 10/08/17 0913 10/12/17 0801 10/13/17 0533  INR 1.8* 3.21 1.27   Cardiac Enzymes: No results for input(s): CKTOTAL, CKMB, CKMBINDEX, TROPONINI in the last 168 hours. BNP (last 3 results) No results for input(s): PROBNP in the last 8760 hours. HbA1C: No results for input(s): HGBA1C in the last 72 hours. CBG: Recent Labs  Lab 10/12/17 0755  GLUCAP 179*   Lipid Profile: No results for input(s): CHOL, HDL, LDLCALC, TRIG, CHOLHDL, LDLDIRECT in the last 72 hours. Thyroid Function Tests: No results for input(s): TSH,  T4TOTAL, FREET4, T3FREE, THYROIDAB in the last 72 hours. Anemia Panel: Recent Labs    10/13/17 1222  VITAMINB12 401  FOLATE 34.2  FERRITIN 163  TIBC 262  IRON 52  RETICCTPCT 3.2*      Radiology Studies: I have reviewed all of the imaging during this hospital visit personally     Scheduled Meds: . atorvastatin  80 mg Oral q1800  . diltiazem  120 mg Oral QHS  . diltiazem  240 mg Oral Daily  . docusate sodium  100 mg Oral BID  . isosorbide mononitrate  60 mg Oral Daily  . metoprolol tartrate  100 mg Oral BID   Continuous Infusions:   LOS: 2 days        Lonzo Saulter Gerome Apley, MD [facility] Pager [phone]
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
## 2017-10-14 NOTE — Progress Notes (Signed)
Progress Note  Patient Name: Patty [name] Cost Date of Encounter: 10/14/2017  Primary Cardiologist: No primary care provider on file.   Subjective   Patient feels ok.  SHe walked and felt tired.  No CP or palpitations.   Inpatient Medications    Scheduled Meds: . sodium chloride   Intravenous Once  . atorvastatin  80 mg Oral q1800  . diltiazem  120 mg Oral QHS  . diltiazem  240 mg Oral Daily  . docusate sodium  100 mg Oral BID  . furosemide  40 mg Intravenous Once  . isosorbide mononitrate  60 mg Oral Daily  . metoprolol tartrate  100 mg Oral BID   Continuous Infusions:  PRN Meds: acetaminophen **OR** acetaminophen, morphine injection, ondansetron **OR** ondansetron (ZOFRAN) IV   Vital Signs    Vitals:   10/13/17 1200 10/13/17 1406 10/13/17 2123 10/14/17 0537  BP:  (!) 130/59 (!) 133/46 (!) 130/45  Pulse:  77 74 78  Resp:  18 17 17   Temp:  98.8 F (37.1 C) 99.3 F (37.4 C) 98.7 F (37.1 C)  TempSrc:  Oral Oral Oral  SpO2:  90% 96% 92%  Weight: 86.2 kg   83.2 kg    Intake/Output Summary (Last 24 hours) at 10/14/2017 1119 Last data filed at 10/14/2017 0406 Gross per 24 hour  Intake 535 ml  Output 925 ml  Net -390 ml   Filed Weights   10/13/17 1200 10/14/17 0537  Weight: 86.2 kg 83.2 kg    Telemetry    Not on tele  ECG    V-paced - Personally Reviewed  Physical Exam   GEN: No acute distress.   Neck: No JVD Cardiac: RRR, no murmurs, rubs, or gallops.  Respiratory: Clear to auscultation bilaterally. GI: Soft, nontender, non-distended  MS: No edema; No deformity. Neuro:  Nonfocal  Psych: Normal affect   Labs    Chemistry Recent Labs  Lab 10/08/17 0905  10/12/17 0801 10/12/17 0820 10/13/17 0533 10/14/17 0549  NA  --    < > 143 142 142 140  K  --    < > 3.8 3.7 3.6 4.0  CL  --    < > 104 101 102 101  CO2  --   --  29  --  28 30  GLUCOSE  --    < > 196* 191* 122* 129*  BUN  --    < > 22 24* 20 17  CREATININE  --    < > 1.24* 1.20* 1.05*  1.00  CALCIUM  --   --  8.7*  --  8.4* 8.1*  PROT 6.5  --  5.5*  --   --   --   ALBUMIN 3.9  --  2.9*  --   --   --   AST 25  --  27  --   --   --   ALT 21  --  21  --   --   --   ALKPHOS 110  --  72  --   --   --   BILITOT 0.6  --  1.1  --   --   --   GFRNONAA  --   --  38*  --  47* 50*  GFRAA  --   --  45*  --  55* 58*  ANIONGAP  --   --  10  --  12 9   < > = values in this interval not displayed.  Hematology Recent Labs  Lab 10/12/17 0801  10/13/17 0533 10/13/17 1222 10/14/17 0549  WBC 9.1  --  9.2  --  9.5  RBC 3.56*  --  2.71* 2.54* 2.36*  HGB 10.9*   < > 8.4* 8.0* 7.3*  HCT 35.0*   < > 26.5* 25.1* 23.1*  MCV 98.3  --  97.8  --  97.9  MCH 30.6  --  31.0  --  30.9  MCHC 31.1  --  31.7  --  31.6  RDW 15.0  --  15.5  --  15.5  PLT 216  --  167  --  137*   < > = values in this interval not displayed.    Cardiac EnzymesNo results for input(s): TROPONINI in the last 168 hours.  Recent Labs  Lab 10/12/17 0811  TROPIPOC 0.05     BNPNo results for input(s): BNP, PROBNP in the last 168 hours.   DDimer No results for input(s): DDIMER in the last 168 hours.   Radiology    Dg Chest Port 1 View  Result Date: 10/13/2017 CLINICAL DATA:  Shortness of Breath EXAM: PORTABLE CHEST 1 VIEW COMPARISON:  06/29/2017 FINDINGS: Left pacer remains in place, unchanged. Mild cardiomegaly with vascular congestion. Bilateral lower lobe airspace opacities. Small bilateral effusions. Worsening aeration in the bases since prior study. IMPRESSION: Small bilateral effusions with bibasilar atelectasis or infiltrate. Cardiomegaly with vascular congestion. Worsening aeration in the lung bases. Electronically Signed   By: Rolm Baptise M.D.   On: 10/13/2017 09:45    Cardiac Studies   ECG reviewed  Patient Profile     84 y.o. female with CAD, AFib and now life - Threatening bleeding  Assessment & Plan    1) Hold Plavix and Coumadin.  Likely needs transfusion today as Hbg continues to drop.     D/w GYN-Onc.  General surgery also involved in case surgery needed.  OK to proceed to surgery if needed.  Fatigue may be related to anemia.  No tachycardia noted on exam.      For questions or updates, please contact [place] Please consult [email or web address] for contact info under        Signed, Larae Grooms, MD  10/14/2017, 11:19 AM

## 2017-10-14 NOTE — Progress Notes (Signed)
  Echocardiogram 2D Echocardiogram has been performed.  Patty [name] T [name] 10/14/2017, 12:58 PM

## 2017-10-15 ENCOUNTER — Inpatient Hospital Stay (HOSPITAL_COMMUNITY): Payer: Medicare Other

## 2017-10-15 DIAGNOSIS — E782 Mixed hyperlipidemia: Secondary | ICD-10-CM

## 2017-10-15 LAB — CBC WITH DIFFERENTIAL/PLATELET
ABS IMMATURE GRANULOCYTES: 0 10*3/uL (ref 0.0–0.1)
BASOS ABS: 0 10*3/uL (ref 0.0–0.1)
Basophils Relative: 0 %
Eosinophils Absolute: 0.1 10*3/uL (ref 0.0–0.7)
Eosinophils Relative: 1 %
HEMATOCRIT: 27.1 % — AB (ref 36.0–46.0)
HEMOGLOBIN: 8.7 g/dL — AB (ref 12.0–15.0)
Immature Granulocytes: 0 %
LYMPHS ABS: 1 10*3/uL (ref 0.7–4.0)
LYMPHS PCT: 13 %
MCH: 31.1 pg (ref 26.0–34.0)
MCHC: 32.1 g/dL (ref 30.0–36.0)
MCV: 96.8 fL (ref 78.0–100.0)
Monocytes Absolute: 0.8 10*3/uL (ref 0.1–1.0)
Monocytes Relative: 10 %
NEUTROS ABS: 5.7 10*3/uL (ref 1.7–7.7)
Neutrophils Relative %: 76 %
Platelets: 147 10*3/uL — ABNORMAL LOW (ref 150–400)
RBC: 2.8 MIL/uL — AB (ref 3.87–5.11)
RDW: 15.3 % (ref 11.5–15.5)
WBC: 7.6 10*3/uL (ref 4.0–10.5)

## 2017-10-15 LAB — TYPE AND SCREEN
ABO/RH(D): A POS
ANTIBODY SCREEN: NEGATIVE
UNIT DIVISION: 0

## 2017-10-15 LAB — BASIC METABOLIC PANEL
ANION GAP: 7 (ref 5–15)
BUN: 15 mg/dL (ref 8–23)
CHLORIDE: 103 mmol/L (ref 98–111)
CO2: 31 mmol/L (ref 22–32)
CREATININE: 0.93 mg/dL (ref 0.44–1.00)
Calcium: 8 mg/dL — ABNORMAL LOW (ref 8.9–10.3)
GFR calc Af Amer: >60 mL/min (ref >60)
GFR calc non Af Amer: 54 mL/min — ABNORMAL LOW (ref >60)
Glucose, Bld: 113 mg/dL — ABNORMAL HIGH (ref 70–99)
POTASSIUM: 3.3 mmol/L — AB (ref 3.5–5.1)
SODIUM: 141 mmol/L (ref 135–145)

## 2017-10-15 LAB — BPAM RBC
BLOOD PRODUCT EXPIRATION DATE: 201910032359
ISSUE DATE / TIME: 201909111121
Unit Type and Rh: 6200

## 2017-10-15 MED ORDER — FUROSEMIDE 10 MG/ML IJ SOLN
40.0000 mg | Freq: Once | INTRAMUSCULAR | Status: AC
  Administered 2017-10-15: 40 mg via INTRAVENOUS
  Filled 2017-10-15: qty 4

## 2017-10-15 MED ORDER — POTASSIUM CHLORIDE CRYS ER 20 MEQ PO TBCR
40.0000 meq | EXTENDED_RELEASE_TABLET | Freq: Once | ORAL | Status: AC
  Administered 2017-10-15: 40 meq via ORAL
  Filled 2017-10-15: qty 2

## 2017-10-15 NOTE — Care Management Important Message (Signed)
Important Message  Patient Details  Name: Patty [name] MRN: [id number] Date of Birth: [birth year]   Medicare Important Message Given:  Yes    Nahsir Venezia Montine Circle 10/15/2017, 4:02 PM

## 2017-10-15 NOTE — Progress Notes (Addendum)
PROGRESS NOTE    Patty [name]  VQQ:[id number] DOB: 1930/05/01 DOA: 10/12/2017 PCP: [name], MD    Brief Narrative:  84 year old female who presented with abdominal pain and hypotension.  She does have significant past medical history for atrial fibrillation, granulosa cell carcinoma, peripheral vascular disease, hypertension, stage III chronic kidney disease, and heart failure.  Reported acute left-sided lower abdominal pain, with nausea.  Physical exam her blood pressure was 60 systolic, after IV fluids 97/78, heart rate 80, respiratory rate 18, temperature 97.5, oxygen saturation 100%.  Clear to auscultation bilaterally, heart S1-S2 present and rhythmic, abdomen tender in the left lower quadrant, nondistended, normal bowel sounds, no lower extremity edema.  Sodium 143, potassium 3.8, chloride 104, bicarb 29, glucose 196, BUN 22, creatinine 1.24, white count 9.1, hemoglobin 10.9, hematocrit 35.0, platelets 216.  INR 3.2.  Urinalysis with specific gravity greater than 1.046, 0-5 white cells.  EKG with ventricular paced rhythm.  Chest radiograph had no significant infiltrates.   CT of the abdomen with extraperitoneal fluid/hemorrhagic related to patient's dominant left pelvic tumor.  Patient was admitted to the hospital with a working diagnosis of hemorrhagic shock due to extra-peritoneal bleed.   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 Shock due to extraperitoneal bleed, related to left ovarian mass./ acute blood loss anemia. Patient sp one unit PRBC transfusion with good toleration, hb  this am is 8.7 and hct 27.Continue to have abdominal pain and distention. Will follow repeat CT abdomen and pelvis in am. Continue to hold on anticoagulation.   2. Acute on chronic diastolic heart failure decompensated. Patient has received IV furosemide due to volume overload, this am continue to complain of dyspnea, will repeat chest film. Follow up echocardiography with LV ejection fraction 40 to 45% with diffuse hypokinesis. At home patient on furosemide and spironolactone.   3. Atrial fibrillation (chronic). Continue rate control with diltiazem and metoprolol. Holding anticoagulation due to acute blood loss anemia. Will continue telemetry monitoring.   4. HTN. Blood pressure control with isosorbide and metoprolol. Systolic 433 to 13, HR has been 70 to 80.   5, Pre-renal AKI. Serum cr down to 0.93 with K at 3,3 and serum bicarbonate at 31. Will correct K with Kcl and will follow renal panel in am.  6. Coronary artery disease. Last PCI June 2018, Continue statin. Holding antiplatelet therapy.   DVT prophylaxis: scd   Code Status:  full Family Communication: no family at the bedside  Disposition Plan/ discharge barriers: Pending repeat abdominal-pelvic imaging.     Consultants:   Cardiology   GYN Surgery     Procedures:      Antimicrobials:      Subjective: Patient reports dyspnea, worse with movement and associated with congestion, no chest pain, no nausea or vomiting. Positive left lower abdominal pain.   Objective: Vitals:   10/14/17 2114 10/14/17 2117 10/14/17 2136 10/15/17 0600  BP:  (!) 119/53 133/61   Pulse: 84 74 76   Resp: 19 16    Temp:  99.2 F (37.3 C)    TempSrc:  Oral  SpO2: 94% 95%    Weight:    83 kg  Height:    5\' 3"  (1.6 m)    Intake/Output Summary (Last 24 hours) at 10/15/2017 1309 Last data filed at 10/15/2017 1100 Gross per 24 hour  Intake 878 ml  Output 1850 ml  Net -972 ml   Filed Weights   10/13/17 1200 10/14/17 0537  10/15/17 0600  Weight: 86.2 kg 83.2 kg 83 kg    Examination:   General: Not in pain or dyspnea, deconditioned  Neurology: Awake and alert, non focal  E ENT: positive pallor, no icterus, oral mucosa moist Cardiovascular: No JVD. S1-S2 present, rhythmic, no gallops, rubs, or murmurs. Trace lower extremity edema. Pulmonary: decreased breath sounds bilaterally at bases, no wheezing, rhonchi or rales. Gastrointestinal. Abdomen protuberant and distended, no significant tenderness, no organomegaly, non tender, no rebound or guarding Skin. No rashes Musculoskeletal: no joint deformities     Data Reviewed: I have personally reviewed following labs and imaging studies  CBC: Recent Labs  Lab 10/12/17 0801 10/12/17 0820 10/13/17 0533 10/13/17 1222 10/14/17 0549 10/15/17 0437  WBC 9.1  --  9.2  --  9.5 7.6  NEUTROABS 7.3  --   --   --   --  5.7  HGB 10.9* 10.9* 8.4* 8.0* 7.3* 8.7*  HCT 35.0* 32.0* 26.5* 25.1* 23.1* 27.1*  MCV 98.3  --  97.8  --  97.9 96.8  PLT 216  --  167  --  137* 277*   Basic Metabolic Panel: Recent Labs  Lab 10/12/17 0801 10/12/17 0820 10/13/17 0533 10/14/17 0549 10/15/17 0437  NA 143 142 142 140 141  K 3.8 3.7 3.6 4.0 3.3*  CL 104 101 102 101 103  CO2 29  --  28 30 31   GLUCOSE 196* 191* 122* 129* 113*  BUN 22 24* 20 17 15   CREATININE 1.24* 1.20* 1.05* 1.00 0.93  CALCIUM 8.7*  --  8.4* 8.1* 8.0*   GFR: Estimated Creatinine Clearance: 45.1 mL/min (by C-G formula based on SCr of 0.93 mg/dL). Liver Function Tests: Recent Labs  Lab 10/12/17 0801  AST 27  ALT 21  ALKPHOS 72  BILITOT 1.1  PROT 5.5*  ALBUMIN 2.9*   Recent Labs  Lab 10/12/17 0801  LIPASE 48   No results for input(s): AMMONIA in the last 168 hours. Coagulation Profile: Recent Labs  Lab 10/12/17 0801 10/13/17 0533  INR 3.21 1.27   Cardiac Enzymes: No results for input(s): CKTOTAL, CKMB, CKMBINDEX, TROPONINI in the last 168 hours. BNP (last 3 results) No results for  input(s): PROBNP in the last 8760 hours. HbA1C: No results for input(s): HGBA1C in the last 72 hours. CBG: Recent Labs  Lab 10/12/17 0755  GLUCAP 179*   Lipid Profile: No results for input(s): CHOL, HDL, LDLCALC, TRIG, CHOLHDL, LDLDIRECT in the last 72 hours. Thyroid Function Tests: No results for input(s): TSH, T4TOTAL, FREET4, T3FREE, THYROIDAB in the last 72 hours. Anemia Panel: Recent Labs    10/13/17 1222  VITAMINB12 401  FOLATE 34.2  FERRITIN 163  TIBC 262  IRON 52  RETICCTPCT 3.2*      Radiology Studies: I have reviewed all of the imaging during this hospital visit personally     Scheduled Meds: . atorvastatin  80 mg Oral q1800  . diltiazem  120 mg Oral QHS  . diltiazem  240 mg Oral Daily  . docusate sodium  100 mg Oral BID  . isosorbide mononitrate  60 mg Oral Daily  .  metoprolol tartrate  100 mg Oral BID   Continuous Infusions:   LOS: 3 days        Anaiz Qazi Gerome Apley, MD [facility] Pager [phone]

## 2017-10-15 NOTE — Plan of Care (Signed)

## 2017-10-15 NOTE — Progress Notes (Addendum)
GYN Oncology  Subjective: Patient reports feeling better this am.  States she is able to get in and out of the bed by herself. She is using the walker with ambulation.  States when she gets back in bed after doing tasks she feels shortness of breath but does not experience it in the middle of performing the tasks.  Denies chest pain, lightheadedness, dizziness, dyspnea.  Tolerating diet and reports feeling hungry this am. Denies nausea or emesis.  Reports decrease in tenderness of the left lower abdomen and states her abd feels softer to her today.    She tolerated the blood transfusion well but states during the transfusion, her IV was leaking intermittently and then the technician came in to perform her echo at the same time which slightly overwhelmed her having so much going on at once.    Objective: Vital signs in last 24 hours: Temp:  [97.5 F (36.4 C)-99.2 F (37.3 C)] 99.2 F (37.3 C) (09/11 2117) Pulse Rate:  [74-84] 76 (09/11 2136) Resp:  [16-20] 16 (09/11 2117) BP: (119-148)/(53-62) 133/61 (09/11 2136) SpO2:  [93 %-95 %] 95 % (09/11 2117) Weight:  [182 lb 15.7 oz (83 kg)] 182 lb 15.7 oz (83 kg) (09/12 0600) Last BM Date: 10/13/17  Intake/Output from previous day: 09/11 0701 - 09/12 0700 In: 518 [P.O.:100; Blood:418] Out: 1650 [Urine:1650]  Physical Examination: General: alert, cooperative, no distress and in bed, clearing throat frequently, short of breath intermittently while talking Resp: diminished in the bases, mild crackles noted Cardio: regular rate and rhythm, S1, S2 normal, no murmur, click, rub or gallop GI: abdomen full, active bowel sounds, decreased tenderness reported with light palpatio of left lower quadrant, abd soft Extremities: mild generalized edema in lower extremities  Labs: WBC/Hgb/Hct/Plts:  7.6/8.7/27.1/147 (09/12 8295) BUN/Cr/glu/ALT/AST/amyl/lip:  15/0.93/--/--/--/--/-- (09/12 6213)  Assessment: 84 y.o. currently admitted probable hemoperitoneum  from tumor bleed related to recurrent granulosa cell tumor. Prior to admission, patient was taking Plavix and Coumadin for history of atrial fibrillation and CAD with stents.  Pain:  Pain is well-controlled on PRN medications.  Heme: Repeat H&H this am: 8.7 and 27.1 this am s/p transfusion of 1 unit PRBC on 10/14/17.   CV: Extensive cardiac history. Cardiology following. Plavix and Coumadin on hold. BP and HR appropriate.  Cardizem CD, Imdur, Lopressor ordered. Continue to monitor with vital signs. S/P reversal of Coumadin yesterday with vitamin K and FFP.  Resp: Chest xray 10/13/17 am: Small bilateral effusions with bibasilar atelectasis or infiltrate.Cardiomegaly with vascular congestion. Worsening aeration in the lung bases. S/P lasix on 10/13/17. Uses CPAP.  GI:  Tolerating po: Yes.   CT 10/12/17: Extraperitoneal fluid/hemorrhage related to the patient's dominant left pelvic tumor, as described above. Progression of the patient's metastatic ovarian cancer, as described above. Dominant cystic/solid mass in the left adnexa/lower pelvis has increased in size. Peritoneal disease/omental caking is mildly progressive. New pulmonary nodule at the right lung base, indeterminate.  GU: Urinary output adequate . Given lasix 10/13/17 due to evidence of volume overload on chest xray and again after receiving unit of blood on 10/14/17. Creatinine 0.93 this am.   FEN: No critical values this am.  K+ 3.3 this am. Replacement per Hospitalist team.  Prophylaxis: SCDs ordered.  Anticoagulation on hold.  Plan: Continue plan per Hospitalist Team Continue to monitor H&H for stability Will continue to monitor   LOS: 3 days    Patty [name] 10/15/2017, 9:43 AM  Plan is continue to follow H/H and if stable  potential dc per medicine with plans for outpatient Med-Onc consultation to initiate chemo. Plan to hold anticoagulation until chemo started given risks for intraabdominal bleed.

## 2017-10-16 ENCOUNTER — Inpatient Hospital Stay (HOSPITAL_COMMUNITY): Payer: Medicare Other

## 2017-10-16 ENCOUNTER — Encounter (HOSPITAL_COMMUNITY): Payer: Self-pay

## 2017-10-16 LAB — CBC WITH DIFFERENTIAL/PLATELET
ABS IMMATURE GRANULOCYTES: 0 10*3/uL (ref 0.0–0.1)
BASOS ABS: 0 10*3/uL (ref 0.0–0.1)
Basophils Relative: 0 %
EOS PCT: 2 %
Eosinophils Absolute: 0.2 10*3/uL (ref 0.0–0.7)
HCT: 30.5 % — ABNORMAL LOW (ref 36.0–46.0)
HEMOGLOBIN: 9.8 g/dL — AB (ref 12.0–15.0)
IMMATURE GRANULOCYTES: 0 %
Lymphocytes Relative: 17 %
Lymphs Abs: 1.3 10*3/uL (ref 0.7–4.0)
MCH: 31 pg (ref 26.0–34.0)
MCHC: 32.1 g/dL (ref 30.0–36.0)
MCV: 96.5 fL (ref 78.0–100.0)
Monocytes Absolute: 0.6 10*3/uL (ref 0.1–1.0)
Monocytes Relative: 7 %
NEUTROS ABS: 5.8 10*3/uL (ref 1.7–7.7)
NEUTROS PCT: 74 %
Platelets: 181 10*3/uL (ref 150–400)
RBC: 3.16 MIL/uL — AB (ref 3.87–5.11)
RDW: 15.2 % (ref 11.5–15.5)
WBC: 7.8 10*3/uL (ref 4.0–10.5)

## 2017-10-16 LAB — BASIC METABOLIC PANEL
Anion gap: 8 (ref 5–15)
BUN: 14 mg/dL (ref 8–23)
CO2: 29 mmol/L (ref 22–32)
Calcium: 8.1 mg/dL — ABNORMAL LOW (ref 8.9–10.3)
Chloride: 104 mmol/L (ref 98–111)
Creatinine, Ser: 0.92 mg/dL (ref 0.44–1.00)
GFR calc non Af Amer: 55 mL/min — ABNORMAL LOW (ref >60)
Glucose, Bld: 118 mg/dL — ABNORMAL HIGH (ref 70–99)
Potassium: 3.3 mmol/L — ABNORMAL LOW (ref 3.5–5.1)
SODIUM: 141 mmol/L (ref 135–145)

## 2017-10-16 LAB — TROPONIN I: Troponin I: 0.04 ng/mL (ref <0.03)

## 2017-10-16 LAB — GLUCOSE, CAPILLARY: Glucose-Capillary: 192 mg/dL — ABNORMAL HIGH (ref 70–99)

## 2017-10-16 MED ORDER — IOPAMIDOL (ISOVUE-300) INJECTION 61%
100.0000 mL | Freq: Once | INTRAVENOUS | Status: AC | PRN
  Administered 2017-10-16: 100 mL via INTRAVENOUS

## 2017-10-16 MED ORDER — NITROGLYCERIN 0.4 MG SL SUBL
SUBLINGUAL_TABLET | SUBLINGUAL | Status: AC
  Filled 2017-10-16: qty 1

## 2017-10-16 MED ORDER — FUROSEMIDE 40 MG PO TABS
40.0000 mg | ORAL_TABLET | Freq: Every day | ORAL | Status: DC
  Administered 2017-10-16 – 2017-10-17 (×2): 40 mg via ORAL
  Filled 2017-10-16 (×2): qty 1

## 2017-10-16 MED ORDER — POTASSIUM CHLORIDE CRYS ER 20 MEQ PO TBCR
40.0000 meq | EXTENDED_RELEASE_TABLET | Freq: Once | ORAL | Status: AC
  Administered 2017-10-16: 40 meq via ORAL
  Filled 2017-10-16: qty 2

## 2017-10-16 MED ORDER — IOPAMIDOL (ISOVUE-370) INJECTION 76%
INTRAVENOUS | Status: AC
  Filled 2017-10-16: qty 100

## 2017-10-16 MED ORDER — NITROGLYCERIN 0.4 MG SL SUBL
0.4000 mg | SUBLINGUAL_TABLET | SUBLINGUAL | Status: DC | PRN
  Administered 2017-10-16 (×2): 0.4 mg via SUBLINGUAL
  Filled 2017-10-16: qty 1

## 2017-10-16 NOTE — Progress Notes (Signed)
Progress Note  Patient Name: Patty [name] Date of Encounter: 10/16/2017  Primary Cardiologist: No primary care provider on file. Lachelle Rissler  Subjective   Feels better.  Had some palpitaitons and chest discomfort this morning but it resolved with NTG.  Typical pattern for her when her AFib flares up.   Inpatient Medications    Scheduled Meds: . atorvastatin  80 mg Oral q1800  . diltiazem  120 mg Oral QHS  . diltiazem  240 mg Oral Daily  . docusate sodium  100 mg Oral BID  . furosemide  40 mg Oral Daily  . isosorbide mononitrate  60 mg Oral Daily  . metoprolol tartrate  100 mg Oral BID  . nitroGLYCERIN       Continuous Infusions:  PRN Meds: acetaminophen **OR** acetaminophen, morphine injection, nitroGLYCERIN, ondansetron **OR** ondansetron (ZOFRAN) IV   Vital Signs    Vitals:   10/16/17 0523 10/16/17 0602 10/16/17 0656 10/16/17 0802  BP: (!) 154/52  (!) 151/63 (!) 155/53  Pulse: 73  77 74  Resp:    20  Temp:      TempSrc:      SpO2:  95% 93%   Weight:      Height:        Intake/Output Summary (Last 24 hours) at 10/16/2017 1144 Last data filed at 10/16/2017 1016 Gross per 24 hour  Intake 240 ml  Output 1250 ml  Net -1010 ml   Filed Weights   10/13/17 1200 10/14/17 0537 10/15/17 0600  Weight: 86.2 kg 83.2 kg 83 kg    Telemetry    Not on tele - Personally Reviewed  ECG      Physical Exam   GEN: No acute distress.   Neck: No JVD Cardiac: tachycardic, irregularly irregular, no murmurs, rubs, or gallops.  Respiratory: Clear to auscultation bilaterally. GI: Soft, nontender, non-distended  MS: No edema; No deformity. Neuro:  Nonfocal  Psych: Normal affect   Labs    Chemistry Recent Labs  Lab 10/12/17 0801  10/14/17 0549 10/15/17 0437 10/16/17 0449  NA 143   < > 140 141 141  K 3.8   < > 4.0 3.3* 3.3*  CL 104   < > 101 103 104  CO2 29   < > 30 31 29   GLUCOSE 196*   < > 129* 113* 118*  BUN 22   < > 17 15 14   CREATININE 1.24*   < > 1.00  0.93 0.92  CALCIUM 8.7*   < > 8.1* 8.0* 8.1*  PROT 5.5*  --   --   --   --   ALBUMIN 2.9*  --   --   --   --   AST 27  --   --   --   --   ALT 21  --   --   --   --   ALKPHOS 72  --   --   --   --   BILITOT 1.1  --   --   --   --   GFRNONAA 38*   < > 50* 54* 55*  GFRAA 45*   < > 58* >60 >60  ANIONGAP 10   < > 9 7 8    < > = values in this interval not displayed.     Hematology Recent Labs  Lab 10/14/17 0549 10/15/17 0437 10/16/17 0449  WBC 9.5 7.6 7.8  RBC 2.36* 2.80* 3.16*  HGB 7.3* 8.7* 9.8*  HCT 23.1* 27.1* 30.5*  MCV 97.9 96.8 96.5  MCH 30.9 31.1 31.0  MCHC 31.6 32.1 32.1  RDW 15.5 15.3 15.2  PLT 137* 147* 181    Cardiac Enzymes Recent Labs  Lab 10/16/17 0724  TROPONINI 0.04*    Recent Labs  Lab 10/12/17 0811  TROPIPOC 0.05     BNPNo results for input(s): BNP, PROBNP in the last 168 hours.   DDimer No results for input(s): DDIMER in the last 168 hours.   Radiology    Dg Chest 1 View  Result Date: 10/15/2017 CLINICAL DATA:  Dyspnea. EXAM: CHEST  1 VIEW COMPARISON:  Radiograph of October 13, 2017. FINDINGS: Stable mild cardiomegaly is noted. Left-sided pacemaker is unchanged in position. Atherosclerosis of thoracic aorta is noted. No pneumothorax is noted. Mild bibasilar subsegmental atelectasis is noted with small associated pleural effusions. Bony thorax is unremarkable. IMPRESSION: Mild bibasilar subsegmental atelectasis with small associated pleural effusions. Aortic Atherosclerosis (ICD10-I70.0). Electronically Signed   By: Marijo Conception, M.D.   On: 10/15/2017 14:29    Cardiac Studies   EF 40-45%  Patient Profile     84 y.o. female with AFib, CAD  Assessment & Plan    1) Holding anticooagulation.  [place] to be started and this should help reduce bleeding.  While off of anticoagulation, she is at increased risk of stroke- but given the life htreatening bleeding that she has had, we need to hold COumadin. Hbg stable.  CT scan today.  AFib: SHe  appears to be in AFib with RVR at this time.  COntinue current meds. Start tele to monitor.  D/w Dr. Cathlean Sauer.     For questions or updates, please contact [place] Please consult [email or web address] for contact info under        Signed, Larae Grooms, MD  10/16/2017, 11:44 AM

## 2017-10-16 NOTE — Progress Notes (Addendum)
GYN Oncology  Subjective: Patient reports "not doing good" this am.  States she started having chest tightness earlier this am with jaw discomfort as well.  She states she has had these symptoms before at home and takes nitroglycerin for. She has had two doses of nitroglycerin this am and states she is no longer having chest tightness after the second dose.  Reports decreased appetite this am but no nausea or emesis.  States she feels weak.  She states she was given lasix at 6 pm last pm and was up all night urinating same as night before so she has not had adequate sleep.  Shortness of breath in bed and when trying to talk.  Last BM two days ago and reported as hard.  Passing flatus.  Reporting tenderness in the left lower quadrant, more than 9/12. She is concerned that she may be stressed which triggered her cardiac issues this am.  She feels she may have a history of anxiety attacks and states at home when she has the symptoms she takes a nitroglycerin with relief.  She wants to make sure that Dr. Irish Lack is aware of her symptoms this am.  No other concerns voiced.  EKG reviewed with patient.   Objective: Vital signs in last 24 hours: Temp:  [98.3 F (36.8 C)-99.3 F (37.4 C)] 98.3 F (36.8 C) (09/13 0522) Pulse Rate:  [73-88] 74 (09/13 0802) Resp:  [16-20] 20 (09/13 0802) BP: (131-157)/(52-75) 155/53 (09/13 0802) SpO2:  [93 %-98 %] 93 % (09/13 0656) Last BM Date: 10/13/17  Intake/Output from previous day: 09/12 0701 - 09/13 0700 In: 600 [P.O.:600] Out: 1250 [Urine:1250]  Physical Examination: General: alert, cooperative, no distress and in bed, short of breath intermittently while talking, states she is on 4L of O2 Resp: lungs clear, no crackles/rhonchi present Cardio: irregularly irregular rhythm GI: abdomen full, active bowel sounds, tenderness reported with light palpatio of left lower quadrant, abd soft Extremities: mild generalized edema in lower extremities, non-pitting  EKG  from this am showing a fib  Labs: WBC/Hgb/Hct/Plts:  7.8/9.8/30.5/181 (09/13 0449) BUN/Cr/glu/ALT/AST/amyl/lip:  14/0.92/--/--/--/--/-- (09/13 0449)  Assessment: 84 y.o. currently admitted probable hemoperitoneum from tumor bleed related to recurrent granulosa cell tumor. Prior to admission, patient was taking Plavix and Coumadin for history of atrial fibrillation and CAD with stents.  Pain:  Pain is well-controlled on PRN medications.  Heme: CBC this am: 9.8 and 30.5 this am s/p transfusion of 1 unit PRBC on 10/14/17.   CV: Currently in A fib.  Given two doses of nitroglycerin for symptoms.  Troponin 0.04 this am. RN stating that her Hospitalist has been notified of her current symptoms. Extensive cardiac history. Cardiology following. Plavix and Coumadin on hold. BP and HR appropriate.  Cardizem CD, Imdur, Lopressor ordered. Continue to monitor with vital signs. S/P reversal of Coumadin yesterday with vitamin K and FFP.  Resp: Chest xray 10/15/17 am: Mild bibasilar subsegmental atelectasis with small associated pleural effusions.  Aortic Atherosclerosis (ICD10-I70.0).  S/P lasix on 10/13/17. Uses CPAP.  GI:  Tolerating po: Yes but decreased appetite this am.   CT 10/12/17: Extraperitoneal fluid/hemorrhage related to the patient's dominant left pelvic tumor, as described above. Progression of the patient's metastatic ovarian cancer, as described above. Dominant cystic/solid mass in the left adnexa/lower pelvis has increased in size. Peritoneal disease/omental caking is mildly progressive. New pulmonary nodule at the right lung base, indeterminate.  GU: Urinary output adequate. Given lasix 10/15/17 last. Creatinine 0.92 this am.   FEN: No critical  values this am.  K+ 3.3 this am. Replacement per Hospitalist team.  Prophylaxis: SCDs ordered.  Anticoagulation on hold.  Plan: Continue plan per Hospitalist Team and Cardiology CT AP scheduled for today Will continue to follow Plan will be for  outpatient chemotherapy once discharged   LOS: 4 days    Patty [name] 10/16/2017, 11:49 AM

## 2017-10-16 NOTE — Progress Notes (Signed)
CRITICAL VALUE ALERT  Critical Value: troponin 0.04  Date & Time Notied:  9/13/1900840  Provider Notified: Arrien  Orders Received/Actions taken: pending

## 2017-10-16 NOTE — Progress Notes (Signed)
   10/16/17 1600  Clinical Encounter Type  Visited With Patient and family together  Visit Type Initial  Referral From Nurse   Rcvd referral from RN this morning to visit the pt after she returned from procedure.  Introduced self to pt and her daughter from [place], [place].  Pt stated that Jeanella Craze ([facility]) had stopped by earlier this afternoon.  She was appreciative of the chaplain visit introduction by this chaplain.  Let her know that she can request staff page chaplain if she desires later.  Myra Gianotti resident, [phone]

## 2017-10-16 NOTE — Progress Notes (Signed)
Pt has home CPAP at bedside and is already wearing it for the night. RT will continue to monitor.

## 2017-10-16 NOTE — Plan of Care (Signed)
  Problem: Education: Goal: Knowledge of General Education information will improve Description: Including pain rating scale, medication(s)/side effects and non-pharmacologic comfort measures Outcome: Progressing   Problem: Health Behavior/Discharge Planning: Goal: Ability to manage health-related needs will improve Outcome: Progressing   Problem: Clinical Measurements: Goal: Ability to maintain clinical measurements within normal limits will improve Outcome: Progressing   Problem: Nutrition: Goal: Adequate nutrition will be maintained Outcome: Progressing   Problem: Coping: Goal: Level of anxiety will decrease Outcome: Progressing   Problem: Elimination: Goal: Will not experience complications related to bowel motility Outcome: Progressing   Problem: Pain Managment: Goal: General experience of comfort will improve Outcome: Progressing   Problem: Safety: Goal: Ability to remain free from injury will improve Outcome: Progressing   

## 2017-10-16 NOTE — Progress Notes (Signed)
Patient continue to complain of chest pain. Nitro given. V/S stable. Patient verbalizes relief after second dose of nitro.

## 2017-10-16 NOTE — Progress Notes (Signed)
PROGRESS NOTE    Patty [name]  XBM:[id number] DOB: 1930-12-21 DOA: 10/12/2017 PCP: [name], MD    Brief Narrative:  84 year old female who presented with abdominal pain and hypotension. She does have significant past medical history for atrial fibrillation, granulosa cell carcinoma, peripheral vascular disease, hypertension, stage III chronic kidney disease, andheart failure.Reported acute left-sided lowerabdominal pain, with nausea.Physical exam her blood pressure was 60 systolic, after IV fluids 97/78, heart rate 80, respiratory rate 18, temperature 97.5, oxygen saturation 100%.Clear to auscultation bilaterally, heart S1-S2 present and rhythmic, abdomen tender in the left lower quadrant, nondistended, normal bowel sounds, no lower extremity edema.  Sodium 143, potassium 3.8, chloride 104, bicarb 29, glucose 196, BUN 22, creatinine 1.24, white count 9.1, hemoglobin 10.9, hematocrit 35.0, platelets 216.  INR 3.2.  Urinalysis with specific gravity greater than 1.046, 0-5 white cells.  EKG with ventricular paced rhythm.  Chest radiograph had no significant infiltrates.  CT of the abdomen with extraperitoneal fluid/hemorrhagic related to patient's dominant left pelvic tumor.  Patient was admitted to the hospital with a working diagnosis of hemorrhagic shock due toextra-peritoneal bleed.   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 Shock due to extraperitoneal bleed, related to left ovarian mass./ acute blood loss anemia. sp one unit PRBC transfusion. Continue to hold on  anticoagulation, follow on repeat CT of the abdomen and pelvis. Blood pressure has remained stable.   2. Acute on chronic diastolic heart failure decompensated. LV ejection fraction 40 to 45% with diffuse hypokinesis. Improved volume status, will resume oral furosemide at 40 mg, continue b blockade.    3. Atrial fibrillation (chronic). Patient had chest discomfort last night, possible uncontrolled atrial fibrillation, will resume telemetry monitoring and will continue AV blockade with diltiazem and metoprolol. Holding anticoagulation due to acute blood loss anemia.  4. HTN. Continue isosorbide and metoprolol.   5, Pre-renal AKI with hypoKalemia. renal function with serum cr at 0.92 with K at 3,3 and serum bicarbonate at 29. Will continue K correction with kcl. Resume oral furosemide to keep negative fluid balance.   6. Coronary artery disease. Last PCI June 2018, On atrovastatin. No antiplatelet therapy due to acute blood loss.   DVT prophylaxis:scd Code Status:full Family Communication:no family at the bedside Disposition Plan/ discharge barriers:Pending repeat abdominal-pelvic imaging.    Consultants:  Cardiology   GYN Surgery    Procedures:    Antimicrobials:    Subjective: Patient had polyuria last night and chest discomfort, was not able to sleep well last night. This am does feel tired and deconditioned. No further chest pain or worsening dyspnea.   Objective: Vitals:   10/16/17 0523 10/16/17 0602 10/16/17 0656 10/16/17 0802  BP: (!) 154/52  (!) 151/63 (!) 155/53  Pulse: 73  77 74  Resp:    20  Temp:      TempSrc:      SpO2:  95% 93%   Weight:      Height:        Intake/Output Summary (Last 24 hours) at 10/16/2017 1033 Last data filed at 10/16/2017 1016 Gross  per 24 hour  Intake 240 ml  Output 1450 ml  Net -1210 ml   Filed Weights   10/13/17 1200 10/14/17 0537 10/15/17 0600  Weight: 86.2 kg 83.2 kg 83 kg    Examination:    General: deconditioned, not in pain Neurology: Awake and alert, non focal  E ENT: positive pallor, no icterus, oral mucosa moist Cardiovascular: No JVD. S1-S2 present, irregularly irregulat, no gallops, rubs, or murmurs. Trace lower extremity edema. Pulmonary: positive breath sounds bilaterally, no wheezing, rhonchi or rales. Gastrointestinal. Abdomen protuberant but nontender, no organomegaly, non tender, no rebound or guarding Skin. No ecchymosis Musculoskeletal: no joint deformities     Data Reviewed: I have personally reviewed following labs and imaging studies  CBC: Recent Labs  Lab 10/12/17 0801  10/13/17 0533 10/13/17 1222 10/14/17 0549 10/15/17 0437 10/16/17 0449  WBC 9.1  --  9.2  --  9.5 7.6 7.8  NEUTROABS 7.3  --   --   --   --  5.7 5.8  HGB 10.9*   < > 8.4* 8.0* 7.3* 8.7* 9.8*  HCT 35.0*   < > 26.5* 25.1* 23.1* 27.1* 30.5*  MCV 98.3  --  97.8  --  97.9 96.8 96.5  PLT 216  --  167  --  137* 147* 181   < > = values in this interval not displayed.   Basic Metabolic Panel: Recent Labs  Lab 10/12/17 0801 10/12/17 0820 10/13/17 0533 10/14/17 0549 10/15/17 0437 10/16/17 0449  NA 143 142 142 140 141 141  K 3.8 3.7 3.6 4.0 3.3* 3.3*  CL 104 101 102 101 103 104  CO2 29  --  28 30 31 29   GLUCOSE 196* 191* 122* 129* 113* 118*  BUN 22 24* 20 17 15 14   CREATININE 1.24* 1.20* 1.05* 1.00 0.93 0.92  CALCIUM 8.7*  --  8.4* 8.1* 8.0* 8.1*   GFR: Estimated Creatinine Clearance: 45.6 mL/min (by C-G formula based on SCr of 0.92 mg/dL). Liver Function Tests: Recent Labs  Lab 10/12/17 0801  AST 27  ALT 21  ALKPHOS 72  BILITOT 1.1  PROT 5.5*  ALBUMIN 2.9*   Recent Labs  Lab 10/12/17 0801  LIPASE 48   No results for input(s): AMMONIA in the last 168 hours. Coagulation Profile: Recent Labs  Lab 10/12/17 0801 10/13/17 0533  INR 3.21 1.27   Cardiac Enzymes: Recent Labs  Lab 10/16/17 0724  TROPONINI 0.04*   BNP (last 3 results) No results for  input(s): PROBNP in the last 8760 hours. HbA1C: No results for input(s): HGBA1C in the last 72 hours. CBG: Recent Labs  Lab 10/12/17 0755 10/16/17 0659  GLUCAP 179* 192*   Lipid Profile: No results for input(s): CHOL, HDL, LDLCALC, TRIG, CHOLHDL, LDLDIRECT in the last 72 hours. Thyroid Function Tests: No results for input(s): TSH, T4TOTAL, FREET4, T3FREE, THYROIDAB in the last 72 hours. Anemia Panel: Recent Labs    10/13/17 1222  VITAMINB12 401  FOLATE 34.2  FERRITIN 163  TIBC 262  IRON 52  RETICCTPCT 3.2*      Radiology Studies: I have reviewed all of the imaging during this hospital visit personally     Scheduled Meds: . atorvastatin  80 mg Oral q1800  . diltiazem  120 mg Oral QHS  . diltiazem  240 mg Oral Daily  . docusate sodium  100 mg Oral BID  . furosemide  40 mg Oral Daily  . isosorbide mononitrate  60 mg Oral Daily  . metoprolol tartrate  100 mg Oral BID  . nitroGLYCERIN       Continuous Infusions:   LOS: 4 days        Patty Strauch Gerome Apley, MD [facility] Pager [phone]

## 2017-10-16 NOTE — Consult Note (Signed)
[facility] CM Primary Care Navigator  10/16/2017  Patty [name] Sep 16, 1930 [id number]   Met withpatientand daughter Patty [name]) at the bedsideto identify possible discharge needs.  Patientreportshaving increased abdominal pain that had led to this admission. (shock due to extraperitoneal fluid/ hemorrhagic related to patient's dominant left pelvic tumor).  Patientendorses [facility] [name] her primary [facility].   [facility] [address] [facility] any problem.   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  Patient statesthatshehas been driving prior to admission but her neighbor/ family friend Patty [name]) and her church members will be able to provide transportation to herdoctors' appointments after discharge.  Patient is independent and living alone at home. Her daughter Patty [name]- from [facility]) will be staying with her through the weekend. Patient also has a daughter in [place] who can assist with her if needed.   Anticipateddischarge planisstill to be determined pending repeat abdominal- pelvic imaging per MD note.  Patientvoiced understanding tocallprimary care provider's office whenshe returns homefor a post discharge follow-upvisitwithin1- 2weeksor sooner if needs arise. Patient letter (with PCP's contact number) was provided asareminder.   Discussed with patient and daughterabout[facility] CM services available for health management/ resources at home and she voiced interest about it, but daughter states that they are still unsure of discharge disposition at this time.Per daughter's report, patient is very anticipatory of her needs and prepares ahead of time. She is also well- informed and knowledgeable of ways to manage her health conditions so far.   Encouraged patient and daughtertoseekreferral fromprimary  care provider to [facility] care management if deemed necessaryand appropriate for anyservices in the nearfuture.  [facility] care management information was provided for future needs thatpatientmay have.  Primary care provider's office is listed as providing transition of care (TOC) follow-up.    For additional questions please contact:  Edwena Felty A. Jahne Krukowski, BSN, RN-BC [facility] PRIMARY CARE Navigator Cell: [phone]

## 2017-10-16 NOTE — Progress Notes (Signed)
3338 Pt c/o Chest pain/tightness, cold clammy perspiration. B/P 151/63 P 77. O2 94% in 2l/[place], CBG 192. Said that she feel so stressed for she did not have a good night rest (received a dose of Lasix at 6Pm which keep her going to the BR frequently. Said that this episode is not new for her. Has this ffrequently at home and nitro help relieve her symptoms.  C. Bodenheimer notified,and ordered to give nitro SL, EKG and Troponin q 6hr x 3. Willl ff/up order and continue to monitor pt

## 2017-10-17 DIAGNOSIS — R58 Hemorrhage, not elsewhere classified: Secondary | ICD-10-CM

## 2017-10-17 LAB — BASIC METABOLIC PANEL
Anion gap: 6 (ref 5–15)
BUN: 15 mg/dL (ref 8–23)
CHLORIDE: 104 mmol/L (ref 98–111)
CO2: 30 mmol/L (ref 22–32)
CREATININE: 0.91 mg/dL (ref 0.44–1.00)
Calcium: 8.4 mg/dL — ABNORMAL LOW (ref 8.9–10.3)
GFR calc Af Amer: >60 mL/min (ref >60)
GFR calc non Af Amer: 56 mL/min — ABNORMAL LOW (ref >60)
Glucose, Bld: 110 mg/dL — ABNORMAL HIGH (ref 70–99)
POTASSIUM: 3.3 mmol/L — AB (ref 3.5–5.1)
Sodium: 140 mmol/L (ref 135–145)

## 2017-10-17 LAB — CBC WITH DIFFERENTIAL/PLATELET
Abs Immature Granulocytes: 0 10*3/uL (ref 0.0–0.1)
Basophils Absolute: 0 10*3/uL (ref 0.0–0.1)
Basophils Relative: 0 %
EOS PCT: 4 %
Eosinophils Absolute: 0.3 10*3/uL (ref 0.0–0.7)
HEMATOCRIT: 30.2 % — AB (ref 36.0–46.0)
HEMOGLOBIN: 9.7 g/dL — AB (ref 12.0–15.0)
Immature Granulocytes: 0 %
LYMPHS ABS: 1 10*3/uL (ref 0.7–4.0)
LYMPHS PCT: 16 %
MCH: 31 pg (ref 26.0–34.0)
MCHC: 32.1 g/dL (ref 30.0–36.0)
MCV: 96.5 fL (ref 78.0–100.0)
MONO ABS: 0.7 10*3/uL (ref 0.1–1.0)
Monocytes Relative: 11 %
Neutro Abs: 4.2 10*3/uL (ref 1.7–7.7)
Neutrophils Relative %: 69 %
Platelets: 196 10*3/uL (ref 150–400)
RBC: 3.13 MIL/uL — AB (ref 3.87–5.11)
RDW: 15.3 % (ref 11.5–15.5)
WBC: 6.2 10*3/uL (ref 4.0–10.5)

## 2017-10-17 MED ORDER — POTASSIUM CHLORIDE CRYS ER 20 MEQ PO TBCR
40.0000 meq | EXTENDED_RELEASE_TABLET | Freq: Once | ORAL | Status: AC
  Administered 2017-10-17: 40 meq via ORAL
  Filled 2017-10-17: qty 2

## 2017-10-17 NOTE — Progress Notes (Signed)
Discharge home. Home discharge instruction given, no question asked °

## 2017-10-17 NOTE — Progress Notes (Signed)
Brief cardiology follow up note  Doing well today, no questions on her cardiac treatment plans. Has some questions re: home health and long term care insurance, which I unfortunately did not have answers for. She has already discussed with social work and will follow up after discharge.  [facility] will sign off in anticipation of discharge.   Medication Recommendations: discontinue clopidogrel and warfarin due to life threatening bleed, per Dr. Hassell Done prior note. Other recommendations (labs, testing, etc):  None from cardiac standpoint. Follow up as an outpatient:  She has an appointment scheduled with our [facility] office on 11/09/17

## 2017-10-17 NOTE — Discharge Summary (Signed)
Physician Discharge Summary  Patty [name] DJM:[id number] DOB: 05-03-1930 DOA: 10/12/2017  PCP: [name], MD  Admit date: 10/12/2017 Discharge date: 10/17/2017  Admitted From: Home  Disposition:  Home   Recommendations for Outpatient Follow-up and new medication changes:  1. Follow up with Dr. Delfina Redwood in 7 days 2. Clopidogrel and warfarin have been discontinued due to intra-abdominal bleed. 3. Follow up with GYN ONC as outpatient   Home Health: Yes   Equipment/Devices: No    Discharge Condition: stable  CODE STATUS: DNR  Diet recommendation: Heart healthy   Brief/Interim Summary: 84 year old female who presented with abdominal pain and hypotension. She does have the significant past medical history for atrial fibrillation, ovarian granulosa cell carcinoma, peripheral vascular disease, hypertension, stage III chronic kidney disease, anddiastolic heart failure.Reported acute left-sided lowerabdominal pain, with nausea.On her initial physical examination her blood pressure was 60 systolic, after IV fluids went up to 97/78, heart rate 80, respiratory rate 18, temperature 97.5, oxygen saturation 100%.Her lungswere clear to auscultation bilaterally, heart S1-S2 present regular rhythm, abdomen tender in the left lower quadrant, nondistended, normal bowel sounds, no lower extremity edema.Sodium 143, potassium 3.8, chloride 104, bicarb 29, glucose 196, BUN 22, creatinine 1.24,white count 9.1, hemoglobin 10.9, hematocrit 35.0, platelets 216.INR 3.2.Urinalysis with specific gravity greater than 1.046, 0-5 white cells. EKG with ventricular paced rhythm. Chest radiograph had no significant infiltrates.CT of the abdomen with extraperitoneal fluid/hemorrhagic related to patient's dominant left pelvic tumor.  Patient was admitted to the hospital with a working diagnosis of hemorrhagic shock due toextra-peritoneal bleed.  1.  Hemorrhagic shock due to extraperitoneal bleed related to  left ovarian granulosa cell carcinoma recurrence.  Patient was admitted to the medical ward, she was placed on a remote telemetry monitor and received IV fluid resuscitation.  Her coagulopathy was reverted with vitamin K and fresh frozen plasma.  She had frequent monitoring of hemoglobin and hematocrit. Her nadir hb reached 7.3, with a hematocrit of 23.1, she received 1 unit packed red blood cells transfusion, and her discharge hemoglobin is 9.7 with hematocrit 30.2.  Follow-up CT of the abdomen and pelvis showed improvement extra-peritoneal hemorrhage.  Clopidogrel and warfarin have been discontinued.  INR by September 10 was 1.27.   2.  Recurrent ovarian granulosa cell carcinoma.  Patient was seen by GYN oncology.  Her bleeding has stopped and anticoagulation has been reverted.  Patient will follow-up as an outpatient for chemotherapy.  3.  Acute on chronic diastolic heart failure.  Patient developed volume overload related to volume resuscitation combined with diastolic heart failure.  Patient received diuresis with IV furosemide with significant improvement of her symptoms.  Further work-up with echocardiography showed left ventricle ejection fraction 40 to 45% with diffuse hypokinesis.  Continue beta-blockade with metoprolol, aldactone and furosemide.   4.  Chronic atrial fibrillation/status post pacer.  Patient was placed on a telemetry monitor, continue AV blockade with diltiazem and metoprolol, anticoagulation was discontinued.  5.  Prerenal acute kidney injury with hypokalemia.  Patient received initially volume resuscitation, her electrolytes were corrected.  She tolerated well diuresis with furosemide.  Her discharge creatinine 0.91, potassium 3.3, serum bicarbonate 30.  6.  Coronary artery disease status post PCI June 2018.  No significant chest pain or angina, continue atorvastatin, isosorbide and as needed nitroglycerin.  Due to bleeding clopidogrel has been discontinued, it has been more  than 12 months from percutaneous coronary intervention.   Discharge Diagnoses:  Principal Problem:   Abdominal pain Active Problems:   CAD (coronary  artery disease)   Essential hypertension   Hyperlipidemia   Combined systolic and diastolic heart failure, NYHA class 3 ([name])   Pacemaker - St Jude Leadless PPM   OSA on CPAP   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[name])   Chronic anticoagulation - coumadin, CHADS2VASC=6   Persistent atrial fibrillation ([name])   Intraperitoneal hemorrhage    Discharge Instructions   Allergies as of 10/17/2017      Reactions   Bee Venom Anaphylaxis   Other Nausea And Vomiting, Other (See Comments)   Pain medications cause severe vomiting. Tolerated slow IV morphine drip   Amiodarone Nausea Only   Prednisone Other (See Comments)   "Rapid Heart Beat"      Medication List    STOP taking these medications   clopidogrel 75 MG tablet Commonly known as:  PLAVIX   warfarin 2 MG tablet Commonly known as:  COUMADIN     TAKE these medications   atorvastatin 80 MG tablet Commonly known as:  LIPITOR TAKE 1 TABLET BY MOUTH EVERY MORNING What changed:  when to take this   beta carotene w/minerals tablet Take 1 tablet by mouth 2 (two) times daily.   CALTRATE 600+D 600-800 MG-UNIT Tabs Generic drug:  Calcium Carb-Cholecalciferol Take 1 tablet by mouth every evening.   Coenzyme Q-10 100 MG capsule Take 200 mg by mouth daily.   diltiazem 120 MG 24 hr capsule Commonly known as:  CARDIZEM CD TAKE ONE CAPSULE BY MOUTH AT BEDTIME What changed:  Another medication with the same name was changed. Make sure you understand how and when to take each.   diltiazem 240 MG 24 hr capsule Commonly known as:  CARDIZEM CD TAKE ONE CAPSULE BY MOUTH DAILY WITH BREAKFAST What changed:  See the new instructions.   FISH OIL PO Take 1,200 mg by mouth daily.   furosemide 40 MG tablet Commonly known as:  LASIX Take 80 mg by mouth daily.   isosorbide  mononitrate 60 MG 24 hr tablet Commonly known as:  IMDUR TAKE 1 TABLET BY MOUTH DAILY   KLOR-CON M20 20 MEQ tablet Generic drug:  potassium chloride SA TAKE 1 TABLET BY MOUTH EVERY DAY What changed:  how much to take   metoprolol tartrate 100 MG tablet Commonly known as:  LOPRESSOR Take 1 tablet (100 mg total) by mouth every morning. Also takes (3) 25 mg tabs (75 mg) in the evening What changed:    when to take this  additional instructions   multivitamin capsule Take 1 capsule by mouth daily.   nitroGLYCERIN 0.4 MG SL tablet Commonly known as:  NITROSTAT PLACE 1 TABLET (0.4 MG TOTAL) UNDER THE TONGUE EVERY 5 (FIVE) MINUTES AS NEEDED. FOR CHEST PAIN What changed:  See the new instructions.   spironolactone 25 MG tablet Commonly known as:  ALDACTONE Take 25 mg by mouth as needed (swelling).       Allergies  Allergen Reactions  . Bee Venom Anaphylaxis  . Other Nausea And Vomiting and Other (See Comments)    Pain medications cause severe vomiting. Tolerated slow IV morphine drip  . Amiodarone Nausea Only  . Prednisone Other (See Comments)    "Rapid Heart Beat"    Consultations:  GYN ONC  Cardiology    Procedures/Studies: Dg Chest 1 View  Result Date: 10/15/2017 CLINICAL DATA:  Dyspnea. EXAM: CHEST  1 VIEW COMPARISON:  Radiograph of October 13, 2017. FINDINGS: Stable mild cardiomegaly is noted. Left-sided pacemaker is unchanged in position. Atherosclerosis of thoracic aorta  is noted. No pneumothorax is noted. Mild bibasilar subsegmental atelectasis is noted with small associated pleural effusions. Bony thorax is unremarkable. IMPRESSION: Mild bibasilar subsegmental atelectasis with small associated pleural effusions. Aortic Atherosclerosis (ICD10-I70.0). Electronically Signed   By: Marijo Conception, M.D.   On: 10/15/2017 14:29   Ct Abdomen Pelvis W Contrast  Result Date: 10/16/2017 CLINICAL DATA:  Followup left pelvic mass and associated hemorrhage. Metastatic  ovarian cancer. EXAM: CT ABDOMEN AND PELVIS WITH CONTRAST TECHNIQUE: Multidetector CT imaging of the abdomen and pelvis was performed using the standard protocol following bolus administration of intravenous contrast. CONTRAST:  161mL ISOVUE-300 IOPAMIDOL (ISOVUE-300) INJECTION 61% COMPARISON:  10/12/2017. FINDINGS: Lower chest: Enlarged heart. Intracardiac pacemaker leads. Atheromatous calcifications, including the coronary arteries and aorta. Hepatobiliary: Stable 1.9 cm oval area of low density with coarse calcifications in the inferior right lobe of the liver, suggesting previous wedge resection. Cholecystectomy clips. Pancreas: Unremarkable. No pancreatic ductal dilatation or surrounding inflammatory changes. Spleen: Normal in size without focal abnormality. Adrenals/Urinary Tract: The left kidney remains small compared to the right kidney. Cortical scarring in the right kidney is unchanged. Normal appearing adrenal glands and ureters. Anterior aspect of the urinary bladder is extending into the proximal portion of a midline ventral hernia at the inferior pelvis. Stomach/Bowel: Unremarkable stomach, small bowel and colon. No evidence of appendicitis. Vascular/Lymphatic: Atheromatous arterial calcifications without aneurysm. No enlarged lymph nodes. Reproductive: Status post hysterectomy. Other: The previously demonstrated 9.4 x 9.2 cm left anterior pelvic hemorrhage currently measures 9.5 x 7.3 cm and is less dense. More anteriorly located hemorrhage on the left previously measured 15.6 x 4.4 cm and currently measures 17.3 x 2.8 cm on image number 64 series 3. A previously demonstrated 2.8 cm cystic and solid midline anterior peritoneal mass at the level of the upper pelvis measures 3.0 cm in corresponding diameter today. The previously demonstrated 8.2 x 5.6 cm cystic and solid left pelvic mass currently measures 7.8 x 5.8 cm on image number 70 series 3. There is less free peritoneal blood in the upper  abdomen. Anterior hernia repair mesh is again demonstrated. A ventral hernia at the level of the inferior pelvis containing herniated small bowel without obstruction is again demonstrated. This also contains a small amount the urinary bladder, as described above. Musculoskeletal: Lumbar and lower thoracic spine degenerative changes. IMPRESSION: 1. Improving peritoneal hemorrhage. 2. No significant change in cystic and solid left pelvic mass and smaller anterior peritoneal cystic and solid mass compatible with tumor implant. 3. No evidence of metastatic disease elsewhere in the abdomen or pelvis. 4. Cardiomegaly. 5.  Calcific coronary artery and aortic atherosclerosis. Aortic Atherosclerosis (ICD10-I70.0). Electronically Signed   By: Claudie Revering M.D.   On: 10/16/2017 12:32   Ct Abdomen Pelvis W Contrast  Result Date: 10/12/2017 CLINICAL DATA:  Left lower quadrant pain, nausea. History of metastatic ovarian cancer. EXAM: CT ABDOMEN AND PELVIS WITH CONTRAST TECHNIQUE: Multidetector CT imaging of the abdomen and pelvis was performed using the standard protocol following bolus administration of intravenous contrast. CONTRAST:  47mL ISOVUE-300 IOPAMIDOL (ISOVUE-300) INJECTION 61% COMPARISON:  03/24/2017 FINDINGS: Motion degraded images. Lower chest: 11 mm nodule at the right lung base (series 7/image 3), new. Mild right lower lobe atelectasis. Cardiomegaly. Three vessel coronary atherosclerosis. Pacemaker leads, incompletely visualized. Hepatobiliary: 1.9 cm low-density lesion in segment 6 of the liver (series 4/image 30), with associated postsurgical change. Status post cholecystectomy. No intrahepatic or extrahepatic ductal dilatation. Pancreas: Within normal limits. Spleen: Within normal limits. Adrenals/Urinary Tract: Adrenal  glands are within normal limits. Left renal cortical scarring/atrophy. Right lower pole cortical scarring (series 4/image 41). No hydronephrosis. Left bladder implants, including a dominant  2.1 cm lesion (series 4/image 35), previously 1.9 cm. Additional lesion along the left pubic symphysis is poorly visualized on the current study (series 4/image 77). Stomach/Bowel: Stomach is notable for a tiny hiatal hernia. No evidence of bowel obstruction. Normal appendix (series 4/image 47). Vascular/Lymphatic: No evidence of abdominal aortic aneurysm. Atherosclerotic calcifications of the abdominal aorta and branch vessels. No suspicious abdominopelvic lymphadenopathy. Reproductive: Status post hysterectomy. 5.6 x 8.2 cm predominantly cystic mass in the left adnexa/lower pelvis (series 4/image 69), previously 4.7 x 4.5 cm. Right medial aspect of the lesion has hemorrhaged into the extraperitoneum, measuring approximately 9.2 x 9.4 cm superior to the mass (series 4/image 60) with additional fluid/hemorrhage in the anterior abdominal mesentery measuring approximately 4.4 x 15.6 cm (series 4/image 65). Other: Additional peritoneal disease/omental caking beneath the anterior abdominal wall, including a 2.8 cm cystic/solid implant beneath midline anterior abdominal wall mesh (series 4/image 63), previously 2.1 cm. Additional fluid/hemorrhage along the liver, spleen, and right pericolic gutter. Musculoskeletal: Degenerative changes of the visualized thoracolumbar spine. IMPRESSION: Extraperitoneal fluid/hemorrhage related to the patient's dominant left pelvic tumor, as described above. Progression of the patient's metastatic ovarian cancer, as described above. Dominant cystic/solid mass in the left adnexa/lower pelvis has increased in size. Peritoneal disease/omental caking is mildly progressive. New pulmonary nodule at the right lung base, indeterminate. These results were called by telephone at the time of interpretation on 10/12/2017 at 9:36 am to Dr. Aletta Edouard , who verbally acknowledged these results. Electronically Signed   By: Julian Hy M.D.   On: 10/12/2017 09:41   Dg Chest Port 1 View  Result  Date: 10/13/2017 CLINICAL DATA:  Shortness of Breath EXAM: PORTABLE CHEST 1 VIEW COMPARISON:  06/29/2017 FINDINGS: Left pacer remains in place, unchanged. Mild cardiomegaly with vascular congestion. Bilateral lower lobe airspace opacities. Small bilateral effusions. Worsening aeration in the bases since prior study. IMPRESSION: Small bilateral effusions with bibasilar atelectasis or infiltrate. Cardiomegaly with vascular congestion. Worsening aeration in the lung bases. Electronically Signed   By: Rolm Baptise M.D.   On: 10/13/2017 09:45       Subjective: Patient is feeling better, no significant abdominal pain, no nausea or vomiting, no dyspnea or chest pain.   Discharge Exam: Vitals:   10/16/17 2026 10/17/17 0540  BP: (!) 156/64 (!) 151/62  Pulse: 85 80  Resp:  18  Temp: 98.4 F (36.9 C) 98.2 F (36.8 C)  SpO2: 98% 92%   Vitals:   10/16/17 0802 10/16/17 1344 10/16/17 2026 10/17/17 0540  BP: (!) 155/53 131/61 (!) 156/64 (!) 151/62  Pulse: 74 82 85 80  Resp: 20 18  18   Temp:  98.4 F (36.9 C) 98.4 F (36.9 C) 98.2 F (36.8 C)  TempSrc:  Oral Oral Oral  SpO2:  92% 98% 92%  Weight:      Height:        General: Not in pain or dyspnea, Neurology: Awake and alert, non focal  E ENT: mild pallor, no icterus, oral mucosa moist Cardiovascular: No JVD. S1-S2 present, rhythmic, no gallops, rubs, or murmurs. Trace lower extremity edema. Pulmonary: positive breath sounds bilaterally, adequate air movement, no wheezing, rhonchi or rales. Gastrointestinal. Abdomen protuberant  no organomegaly, non tender, no rebound or guarding Skin. No rashes Musculoskeletal: no joint deformities   The results of significant diagnostics from this hospitalization (including imaging,  microbiology, ancillary and laboratory) are listed below for reference.     Microbiology: No results found for this or any previous visit (from the past 240 hour(s)).   Labs: BNP (last 3 results) Recent Labs     06/29/17 1154  BNP 841.3*   Basic Metabolic Panel: Recent Labs  Lab 10/13/17 0533 10/14/17 0549 10/15/17 0437 10/16/17 0449 10/17/17 0516  NA 142 140 141 141 140  K 3.6 4.0 3.3* 3.3* 3.3*  CL 102 101 103 104 104  CO2 28 30 31 29 30   GLUCOSE 122* 129* 113* 118* 110*  BUN 20 17 15 14 15   CREATININE 1.05* 1.00 0.93 0.92 0.91  CALCIUM 8.4* 8.1* 8.0* 8.1* 8.4*   Liver Function Tests: Recent Labs  Lab 10/12/17 0801  AST 27  ALT 21  ALKPHOS 72  BILITOT 1.1  PROT 5.5*  ALBUMIN 2.9*   Recent Labs  Lab 10/12/17 0801  LIPASE 48   No results for input(s): AMMONIA in the last 168 hours. CBC: Recent Labs  Lab 10/12/17 0801  10/13/17 0533 10/13/17 1222 10/14/17 0549 10/15/17 0437 10/16/17 0449 10/17/17 0516  WBC 9.1  --  9.2  --  9.5 7.6 7.8 6.2  NEUTROABS 7.3  --   --   --   --  5.7 5.8 4.2  HGB 10.9*   < > 8.4* 8.0* 7.3* 8.7* 9.8* 9.7*  HCT 35.0*   < > 26.5* 25.1* 23.1* 27.1* 30.5* 30.2*  MCV 98.3  --  97.8  --  97.9 96.8 96.5 96.5  PLT 216  --  167  --  137* 147* 181 196   < > = values in this interval not displayed.   Cardiac Enzymes: Recent Labs  Lab 10/16/17 0724  TROPONINI 0.04*   BNP: Invalid input(s): POCBNP CBG: Recent Labs  Lab 10/12/17 0755 10/16/17 0659  GLUCAP 179* 192*   D-Dimer No results for input(s): DDIMER in the last 72 hours. Hgb A1c No results for input(s): HGBA1C in the last 72 hours. Lipid Profile No results for input(s): CHOL, HDL, LDLCALC, TRIG, CHOLHDL, LDLDIRECT in the last 72 hours. Thyroid function studies No results for input(s): TSH, T4TOTAL, T3FREE, THYROIDAB in the last 72 hours.  Invalid input(s): FREET3 Anemia work up No results for input(s): VITAMINB12, FOLATE, FERRITIN, TIBC, IRON, RETICCTPCT in the last 72 hours. Urinalysis    Component Value Date/Time   COLORURINE YELLOW 10/12/2017 0916   APPEARANCEUR CLEAR 10/12/2017 0916   LABSPEC >1.046 (H) 10/12/2017 0916   PHURINE 6.0 10/12/2017 0916   GLUCOSEU  NEGATIVE 10/12/2017 0916   HGBUR NEGATIVE 10/12/2017 0916   BILIRUBINUR NEGATIVE 10/12/2017 0916   KETONESUR NEGATIVE 10/12/2017 0916   PROTEINUR NEGATIVE 10/12/2017 0916   UROBILINOGEN 0.2 12/11/2013 1119   NITRITE NEGATIVE 10/12/2017 0916   LEUKOCYTESUR TRACE (A) 10/12/2017 0916   Sepsis Labs Invalid input(s): PROCALCITONIN,  WBC,  LACTICIDVEN Microbiology No results found for this or any previous visit (from the past 240 hour(s)).   Time coordinating discharge: 45 minutes  SIGNED:   Tawni Millers, MD  [facility] 10/17/2017, 11:50 AM Pager [phone]  If 7PM-7AM, please contact night-coverage [email or web address] Password [email or web address]

## 2017-10-19 ENCOUNTER — Telehealth: Payer: Self-pay | Admitting: Oncology

## 2017-10-19 ENCOUNTER — Inpatient Hospital Stay: Payer: Medicare Other

## 2017-10-19 ENCOUNTER — Inpatient Hospital Stay: Payer: Medicare Other | Attending: Hematology and Oncology | Admitting: Hematology and Oncology

## 2017-10-19 ENCOUNTER — Encounter: Payer: Self-pay | Admitting: Oncology

## 2017-10-19 ENCOUNTER — Telehealth: Payer: Self-pay | Admitting: Hematology and Oncology

## 2017-10-19 ENCOUNTER — Other Ambulatory Visit: Payer: Self-pay | Admitting: Hematology and Oncology

## 2017-10-19 ENCOUNTER — Encounter (HOSPITAL_COMMUNITY): Payer: Self-pay

## 2017-10-19 VITALS — BP 138/51 | HR 84 | Temp 97.5°F | Resp 18 | Ht 63.0 in | Wt 178.4 lb

## 2017-10-19 DIAGNOSIS — B0229 Other postherpetic nervous system involvement: Secondary | ICD-10-CM

## 2017-10-19 DIAGNOSIS — Z7189 Other specified counseling: Secondary | ICD-10-CM

## 2017-10-19 DIAGNOSIS — I5040 Unspecified combined systolic (congestive) and diastolic (congestive) heart failure: Secondary | ICD-10-CM

## 2017-10-19 DIAGNOSIS — G893 Neoplasm related pain (acute) (chronic): Secondary | ICD-10-CM

## 2017-10-19 DIAGNOSIS — C569 Malignant neoplasm of unspecified ovary: Secondary | ICD-10-CM | POA: Insufficient documentation

## 2017-10-19 DIAGNOSIS — Z23 Encounter for immunization: Secondary | ICD-10-CM | POA: Insufficient documentation

## 2017-10-19 DIAGNOSIS — Z87891 Personal history of nicotine dependence: Secondary | ICD-10-CM | POA: Insufficient documentation

## 2017-10-19 DIAGNOSIS — Z79899 Other long term (current) drug therapy: Secondary | ICD-10-CM

## 2017-10-19 DIAGNOSIS — K5909 Other constipation: Secondary | ICD-10-CM

## 2017-10-19 DIAGNOSIS — Z90710 Acquired absence of both cervix and uterus: Secondary | ICD-10-CM | POA: Diagnosis not present

## 2017-10-19 DIAGNOSIS — E440 Moderate protein-calorie malnutrition: Secondary | ICD-10-CM | POA: Insufficient documentation

## 2017-10-19 DIAGNOSIS — R1030 Lower abdominal pain, unspecified: Secondary | ICD-10-CM

## 2017-10-19 DIAGNOSIS — Z90722 Acquired absence of ovaries, bilateral: Secondary | ICD-10-CM | POA: Insufficient documentation

## 2017-10-19 DIAGNOSIS — D3910 Neoplasm of uncertain behavior of unspecified ovary: Secondary | ICD-10-CM

## 2017-10-19 DIAGNOSIS — R5381 Other malaise: Secondary | ICD-10-CM

## 2017-10-19 DIAGNOSIS — C562 Malignant neoplasm of left ovary: Secondary | ICD-10-CM

## 2017-10-19 DIAGNOSIS — Z0000 Encounter for general adult medical examination without abnormal findings: Secondary | ICD-10-CM

## 2017-10-19 MED ORDER — INFLUENZA VAC SPLIT QUAD 0.5 ML IM SUSY
0.5000 mL | PREFILLED_SYRINGE | Freq: Once | INTRAMUSCULAR | Status: AC
  Administered 2017-10-19: 0.5 mL via INTRAMUSCULAR

## 2017-10-19 MED ORDER — HYDROMORPHONE HCL 2 MG PO TABS: 2.0000 mg | ORAL_TABLET | ORAL | 0 refills | Status: DC | PRN

## 2017-10-19 MED ORDER — INFLUENZA VAC SPLIT QUAD 0.5 ML IM SUSY
PREFILLED_SYRINGE | INTRAMUSCULAR | Status: AC
  Filled 2017-10-19: qty 0.5

## 2017-10-19 MED ORDER — ONDANSETRON HCL 8 MG PO TABS: 8.0000 mg | ORAL_TABLET | Freq: Three times a day (TID) | ORAL | 3 refills | Status: DC | PRN

## 2017-10-19 MED ORDER — PROCHLORPERAZINE MALEATE 10 MG PO TABS: 10.0000 mg | ORAL_TABLET | Freq: Four times a day (QID) | ORAL | 0 refills | Status: DC | PRN

## 2017-10-19 NOTE — Telephone Encounter (Signed)
Left a message for patient regarding appointment with Dr. Alvy Bimler today.  Requested a return call.

## 2017-10-19 NOTE — Telephone Encounter (Signed)
Called patient back and advised her that she should come in to see Dr. Alvy Bimler today per Joylene John, NP because she is off of her blood thinners.  Also advised her that she can see Dr. Denman George tomorrow afternoon.  She said she will come in today.  Appointment scheduled at 1:30 pm with Dr. Alvy Bimler.  She verbalized understanding.

## 2017-10-19 NOTE — Telephone Encounter (Signed)
Gave avs and calendar ° °

## 2017-10-19 NOTE — Telephone Encounter (Signed)
Patty [name] called back and said she is very tired and weak from being in the hospital last week. She said she is not ready to start chemo yet and would like to wait until after her appointment with Patty [name] on 10/30/17.  Discussed possible risk of delaying her care and she said she needs time to "recoup" and get stronger. Appointment scheduled with Patty [name] on 11/02/17 at 11:15 am.  She verbalized understanding and agreement.

## 2017-10-19 NOTE — Progress Notes (Signed)
START ON PATHWAY REGIMEN - Ovarian     A cycle is every 21 days:     Paclitaxel      Carboplatin   **Always confirm dose/schedule in your pharmacy ordering system**  Patient Characteristics: Recurrent or Progressive Disease, Second Line Therapy, Platinum Sensitive and ? 6 Months Since Last Therapy Therapeutic Status: Recurrent or Progressive Disease BRCA Mutation Status: Absent Line of Therapy: Second Line  Intent of Therapy: Non-Curative / Palliative Intent, Discussed with Patient

## 2017-10-20 ENCOUNTER — Encounter: Payer: Self-pay | Admitting: Oncology

## 2017-10-20 ENCOUNTER — Encounter: Payer: Self-pay | Admitting: Gynecologic Oncology

## 2017-10-20 ENCOUNTER — Inpatient Hospital Stay: Payer: Medicare Other

## 2017-10-20 ENCOUNTER — Telehealth: Payer: Self-pay | Admitting: Interventional Cardiology

## 2017-10-20 ENCOUNTER — Inpatient Hospital Stay (HOSPITAL_BASED_OUTPATIENT_CLINIC_OR_DEPARTMENT_OTHER): Payer: Medicare Other | Admitting: Gynecologic Oncology

## 2017-10-20 ENCOUNTER — Encounter: Payer: Self-pay | Admitting: Hematology and Oncology

## 2017-10-20 VITALS — BP 141/56 | HR 86 | Temp 98.4°F | Resp 18 | Ht 60.0 in | Wt 178.0 lb

## 2017-10-20 DIAGNOSIS — R1032 Left lower quadrant pain: Secondary | ICD-10-CM | POA: Diagnosis not present

## 2017-10-20 DIAGNOSIS — N183 Chronic kidney disease, stage 3 (moderate): Secondary | ICD-10-CM | POA: Diagnosis not present

## 2017-10-20 DIAGNOSIS — C569 Malignant neoplasm of unspecified ovary: Secondary | ICD-10-CM | POA: Diagnosis not present

## 2017-10-20 DIAGNOSIS — Z87891 Personal history of nicotine dependence: Secondary | ICD-10-CM | POA: Diagnosis not present

## 2017-10-20 DIAGNOSIS — E440 Moderate protein-calorie malnutrition: Secondary | ICD-10-CM | POA: Diagnosis not present

## 2017-10-20 DIAGNOSIS — Z7189 Other specified counseling: Secondary | ICD-10-CM | POA: Insufficient documentation

## 2017-10-20 DIAGNOSIS — D500 Iron deficiency anemia secondary to blood loss (chronic): Secondary | ICD-10-CM | POA: Diagnosis not present

## 2017-10-20 DIAGNOSIS — G893 Neoplasm related pain (acute) (chronic): Secondary | ICD-10-CM | POA: Insufficient documentation

## 2017-10-20 DIAGNOSIS — Z23 Encounter for immunization: Secondary | ICD-10-CM | POA: Diagnosis not present

## 2017-10-20 DIAGNOSIS — R5381 Other malaise: Secondary | ICD-10-CM | POA: Insufficient documentation

## 2017-10-20 DIAGNOSIS — I5042 Chronic combined systolic (congestive) and diastolic (congestive) heart failure: Secondary | ICD-10-CM | POA: Diagnosis not present

## 2017-10-20 DIAGNOSIS — B0229 Other postherpetic nervous system involvement: Secondary | ICD-10-CM | POA: Insufficient documentation

## 2017-10-20 DIAGNOSIS — Z90722 Acquired absence of ovaries, bilateral: Secondary | ICD-10-CM | POA: Diagnosis not present

## 2017-10-20 DIAGNOSIS — Z90710 Acquired absence of both cervix and uterus: Secondary | ICD-10-CM

## 2017-10-20 DIAGNOSIS — C562 Malignant neoplasm of left ovary: Secondary | ICD-10-CM | POA: Diagnosis not present

## 2017-10-20 DIAGNOSIS — K5909 Other constipation: Secondary | ICD-10-CM | POA: Insufficient documentation

## 2017-10-20 NOTE — Telephone Encounter (Signed)
New message   Patient wants to know if she should have cancelled the INR Appointment on 10/22/17 because the patient states that she is not on any blood thinners at this time. Please call to discuss.

## 2017-10-20 NOTE — Assessment & Plan Note (Signed)
The patient is aware she has incurable disease and treatment is strictly palliative. We discussed importance of Advanced Directives and Living will. 

## 2017-10-20 NOTE — Assessment & Plan Note (Signed)
She has intermittent cancer pain I recommend a trial of low-dose pain medicine as needed We discussed the risk of nausea and constipation with pain medicine and I will assess her pain control next visit

## 2017-10-20 NOTE — Progress Notes (Signed)
[place] OFFICE PROGRESS NOTE  Patient Care Team: [name], MD as PCP - General (Internal Medicine)  ASSESSMENT & PLAN:  Granulosa cell tumor of ovary The patient had never received chemotherapy despite multiple recurrence.  Each of those recurrences are managed with surgical resection. Recent CT imaging were reviewed with the patient extensively. At this point, I recommend palliative chemotherapy.  The patient has significant risk of bowel obstruction. The patient is undecided.  She would like to meet with GYN oncologist first and chemo education class to weigh in the risk, benefits, side effects of treatment If she is interested, I would recommend combination chemotherapy with carboplatin and Taxol However, if she is concerned about significant side effects, single agent carboplatin will reduce dose combination treatment can be attempted I will schedule chemo education class at the end of the week and see her back next week for final consent.  I recommend port placement and influenza vaccination and she agreed to proceed.  Combined systolic and diastolic heart failure, NYHA class 3 ([name]) Currently, she has no signs or symptoms of severe congestive heart failure I recommend her to continue medical management and close follow-up with her cardiologist.  The patient was taken off anticoagulation therapy due to recent retroperitoneal bleed I would defer to her cardiologist to discuss when to resume her treatment.  Other constipation She has recent constipation, resolved with laxative We discussed regular use of laxatives as needed.  Postherpetic neuralgia She has baseline postherpetic neuralgia and will be sensitive to side effects of chemotherapy causing neuropathy I will consider gabapentin in the next visit  Cancer associated pain She has intermittent cancer pain I recommend a trial of low-dose pain medicine as needed We discussed the risk of nausea and  constipation with pain medicine and I will assess her pain control next visit  Physical debility The patient has significant major comorbidities and is physically debilitated with recent hospitalization I recommend advanced home care service with RN visit and evaluation.  She agree with the plan of care  Goals of care, counseling/discussion The patient is aware she has incurable disease and treatment is strictly palliative. We discussed importance of Advanced Directives and Living will.    Orders Placed This Encounter  Procedures  . Comprehensive metabolic panel    Standing Status:   Future    Standing Expiration Date:   11/23/2018  . CBC with Differential/Platelet    Standing Status:   Future    Standing Expiration Date:   11/23/2018  . Inhibin B    Standing Status:   Standing    Number of Occurrences:   9    Standing Expiration Date:   10/21/2018  . Ambulatory referral to Home Health    Referral Priority:   Routine    Referral Type:   Home Health Care    Referral Reason:   Specialty Services Required    Requested Specialty:   [place]    Number of Visits Requested:   1    INTERVAL HISTORY: Please see below for problem oriented charting. She is seen urgently due to recent findings of recurrent cancer. She was recently hospitalized for retroperitoneal bleed and her anticoagulation was discontinued She complained of weakness She has chronic postherpetic neuralgia due to history of shingles. She had intermittent abdominal pain and recent constipation.  Denies nausea. She denies recent fever or chills.  Since discharge from the hospital, she denies cough, chest pain or shortness of breath. The patient denies  any recent signs or symptoms of bleeding such as spontaneous epistaxis, hematuria or hematochezia.   SUMMARY OF ONCOLOGIC HISTORY:   Granulosa cell tumor of ovary   02/19/1998 Initial Diagnosis    The granulosa cell tumor was initially diagnosed at [facility] in  2000, with recurrences in 2004, 2007 and spring 2011. The original operative note from 2000 and that pathology did not transfer into present EMR and I do not know laterality of the original tumor. The surgery in 2004 included resection of the tumor with small bowel resection and colectomy for diverticulosis. She had laparoscopic repair of ventral hernia in 2005. In 2007 she had resection of pelvic recurrence of tumor plus open repair of ventral hernia with mesh. Most recent intervention was laparoscopic resection of suprapubic involvement by Dr.Hoxworth in May 2011; she had a small and asymptomatic suprapubic hernia in 2011 which has been followed. All of her disease has been very indolent and has generally been detected by CT scans. Last CT AP was April 2013; [name], self-limited episode of bowel obstruction in May 2012.    08/24/2002 Pathology Results    SOFT TISSUE MASS, [name], [name], there are tumor cells present which appear histologically similar to those noted in the patient's previous granulosa cell tumor ([id number]) and therefore, consistent with recurrent/metastatic granulosa cell tumor. The findings correlate with the cytology material    12/06/2002 Pathology Results    1. SMALL INTESTINE, [name], SIGMOID, SEGMENTAL RESECTION: DIVERTICULOSIS WITH FOCAL MILD CHRONIC DIVERTICULITIS. SIX BENIGN LYMPH NODES. NO TUMOR IDENTIFIED.    05/12/2005 PET scan    Again identified are enlarged lymph nodes within the right posterior cervical region and left external iliac region, as well as a large omental or peritoneal deposit just below the right rectus sheath.  All of these exhibit very low-level  FDG uptake which likely reflects the hypometabolic state of this tumor.  Clearly these masses have increased over time, especially when compared with the examination dated 10/30/04.    10/01/2005 Pathology Results    1. ABDOMINAL WALL MASS: METASTATIC GRANULOSA CELL TUMOR.  2. LEFT ILIAC MASS: METASTATIC GRANULOSA CELL TUMOR.  COMMENT Both specimens are involved by metastatic tumor with features consistent with metastasis from the previously diagnosed adult granulosa cell tumor.     06/11/2007 Imaging    Stable CT the pelvis - no evidence of metastatic disease.    06/09/2008 Imaging    1.  Interval enlargement of small left perivesical nodule measuring 18 mm maximally.  This may reflect a lymph node but has a nonspecific appearance.  Attention to this area followup is recommended. 2.  Otherwise stable postsurgical appearance of the pelvis.    12/14/2008 Imaging    1.  Mild increase in size of low attenuation nodule in the left anterior pelvis indenting the urinary bladder, which now measures 2.8 cm compared with 1.8 cm on prior study.  Metastatic disease cannot be excluded. 2.  No other significant changes identified.  Stable diverticulosis and small suprapubic anterior abdominal wall hernia containing a loop of small bowel.    05/23/2009 Pathology Results    1. SOFT TISSUE MASS, SIMPLE EXCISION, : INVOLVEMENT BY NEOPLASM CONSISTENT WITH METASTATIC GRANULOSA CELL TUMOR.  06/08/2010 Imaging    1.  No acute findings. 2.  Interval progression of mild diffuse left renal atrophy and hypoperfusion, consistent with left renal artery stenosis. 3.  Stable small hiatal hernia, tiny periumbilical hernia, and small suprapubic hernia containing a loop of small bowel.  No evidence of bowel ischemia or obstruction. 4.  Diverticulosis.  No radiographic evidence of diverticulitis.    05/22/2011 Imaging    1.  Stable exam.  No new or progressive disease identified within the abdomen or pelvis. 2.  Stable  small hiatal hernia and suprapubic ventral hernia. Stable left renal atrophy, likely secondary to renal artery stenosis.    11/28/2011 Imaging    1.  No findings to suggest recurrent or metastatic disease in the abdomen or pelvis on today's examination. 2.  Indeterminate liver lesion in the right lobe of the liver is unchanged compared to numerous remote prior examinations. 3. Small suprapubic ventral hernia again noted. 4.  Extensive atherosclerosis including at least three vessel coronary artery disease.  5.  Postoperative changes, as above.     12/01/2016 Tumor Marker    Patient's tumor was tested for the following markers: Inhibin B Results of the tumor marker test revealed 119.2    12/11/2016 Imaging    Multiple new soft tissue masses in left pelvis and central small bowel mesentery, consistent with recurrent/metastatic carcinoma.     03/25/2017 Imaging    1. Omental and mesenteric tumor implants throughout the abdomen and pelvis with mixed findings when compared the prior study. Some of these lesions are clearly smaller and others are clearly larger. No new lesions are identified. 2. Stable severe atherosclerotic calcifications. 3. Stable low anterior abdominal wall hernia. 4. Stable low-attenuation lesion involving the right hepatic lobe with adjacent calcifications or surgical clips.    10/12/2017 Imaging    Extraperitoneal fluid/hemorrhage related to the patient's dominant left pelvic tumor, as described above.  Progression of the patient's metastatic ovarian cancer, as described above. Dominant cystic/solid mass in the left adnexa/lower pelvis has increased in size. Peritoneal disease/omental caking is mildly progressive. New pulmonary nodule at the right lung base, indeterminate.     10/12/2017 - 10/17/2017 Hospital Admission    She was admitted for retroperitoneal bleed.    10/16/2017 Imaging    1. Improving peritoneal hemorrhage. 2. No significant change in cystic and  solid left pelvic mass and smaller anterior peritoneal cystic and solid mass compatible with tumor implant. 3. No evidence of metastatic disease elsewhere in the abdomen or pelvis. 4. Cardiomegaly. 5.  Calcific coronary artery and aortic atherosclerosis.  Aortic Atherosclerosis (ICD10-I70.0).     Ovarian cancer ([place])   10/19/2017 Initial Diagnosis    Ovarian cancer ([place])     REVIEW OF SYSTEMS:   Constitutional: Denies fevers, chills or abnormal weight loss Eyes: Denies blurriness of vision Ears, nose, mouth, throat, and face: Denies mucositis or sore throat Respiratory: Denies cough, dyspnea or wheezes Cardiovascular: Denies palpitation, chest discomfort or lower extremity swelling Skin: Denies abnormal skin rashes Lymphatics: Denies new lymphadenopathy or easy bruising Behavioral/Psych: Mood is stable, no new changes  All other systems were reviewed with the patient and are negative.  I have reviewed the past medical history, past surgical history, social history and family history with the patient and they are unchanged from previous note.  ALLERGIES:  is allergic to bee venom; other; amiodarone; and prednisone.  MEDICATIONS:  Current Outpatient Medications  Medication Sig Dispense Refill  . atorvastatin (LIPITOR) 80 MG tablet TAKE 1  TABLET BY MOUTH EVERY MORNING (Patient taking differently: Take 80 mg by mouth every morning. ) 90 tablet 2  . beta carotene w/minerals (OCUVITE) tablet Take 1 tablet by mouth 2 (two) times daily.     . Calcium Carb-Cholecalciferol (CALTRATE 600+D) 600-800 MG-UNIT TABS Take 1 tablet by mouth every evening.     . Coenzyme Q-10 100 MG capsule Take 200 mg by mouth daily.     Marland Kitchen diltiazem (CARDIZEM CD) 120 MG 24 hr capsule TAKE ONE CAPSULE BY MOUTH AT BEDTIME (Patient taking differently: Take 120 mg by mouth at bedtime. ) 90 capsule 3  . diltiazem (CARDIZEM CD) 240 MG 24 hr capsule TAKE ONE CAPSULE BY MOUTH DAILY WITH BREAKFAST (Patient taking  differently: Take 240 mg by mouth daily. ) 30 capsule 11  . furosemide (LASIX) 40 MG tablet Take 80 mg by mouth daily.    Marland Kitchen HYDROmorphone (DILAUDID) 2 MG tablet Take 1 tablet (2 mg total) by mouth every 4 (four) hours as needed for severe pain. 30 tablet 0  . isosorbide mononitrate (IMDUR) 60 MG 24 hr tablet TAKE 1 TABLET BY MOUTH DAILY (Patient taking differently: Take 60 mg by mouth daily. ) 30 tablet 9  . KLOR-CON M20 20 MEQ tablet TAKE 1 TABLET BY MOUTH EVERY DAY (Patient taking differently: Take 20 mEq by mouth daily. ) 90 tablet 3  . metoprolol tartrate (LOPRESSOR) 100 MG tablet Take 1 tablet (100 mg total) by mouth every morning. Also takes (3) 25 mg tabs (75 mg) in the evening (Patient taking differently: Take 100 mg by mouth 2 (two) times daily. ) 90 tablet 3  . Multiple Vitamin (MULTIVITAMIN) capsule Take 1 capsule by mouth daily.    . nitroGLYCERIN (NITROSTAT) 0.4 MG SL tablet PLACE 1 TABLET (0.4 MG TOTAL) UNDER THE TONGUE EVERY 5 (FIVE) MINUTES AS NEEDED. FOR CHEST PAIN (Patient taking differently: Place 0.4 mg under the tongue every 5 (five) minutes as needed for chest pain. ) 25 tablet 6  . Omega-3 Fatty Acids (FISH OIL PO) Take 1,200 mg by mouth daily.     . ondansetron (ZOFRAN) 8 MG tablet Take 1 tablet (8 mg total) by mouth every 8 (eight) hours as needed for nausea. 30 tablet 3  . prochlorperazine (COMPAZINE) 10 MG tablet Take 1 tablet (10 mg total) by mouth every 6 (six) hours as needed for nausea or vomiting. 30 tablet 0  . spironolactone (ALDACTONE) 25 MG tablet Take 25 mg by mouth as needed (swelling).     No current facility-administered medications for this visit.     PHYSICAL EXAMINATION: ECOG PERFORMANCE STATUS: 2 - Symptomatic, <50% confined to bed  Vitals:   10/19/17 1350  BP: (!) 138/51  Pulse: 84  Resp: 18  Temp: (!) 97.5 F (36.4 C)  SpO2: 98%   Filed Weights   10/19/17 1350  Weight: 178 lb 6.4 oz (80.9 kg)    GENERAL:alert, no distress and  comfortable SKIN: Extensive bruises are noted EYES: normal, Conjunctiva are pink and non-injected, sclera clear OROPHARYNX:no exudate, no erythema and lips, buccal mucosa, and tongue normal  NECK: supple, thyroid normal size, non-tender, without nodularity LYMPH:  no palpable lymphadenopathy in the cervical, axillary or inguinal LUNGS: clear to auscultation and percussion with normal breathing effort HEART: Regular rate and rhythm, no murmurs and no lower extremity edema ABDOMEN:abdomen soft, non-tender and normal bowel sounds Musculoskeletal:no cyanosis of digits and no clubbing  NEURO: alert & oriented x 3 with fluent speech, no focal motor/sensory deficits.  She looks unsteady on her gait and appears very debilitated  LABORATORY DATA:  I have reviewed the data as listed    Component Value Date/Time   NA 140 10/17/2017 0516   NA 142 12/14/2014 1324   K 3.3 (L) 10/17/2017 0516   K 4.2 12/14/2014 1324   CL 104 10/17/2017 0516   CL 105 05/26/2012 1111   CO2 30 10/17/2017 0516   CO2 30 (H) 12/14/2014 1324   GLUCOSE 110 (H) 10/17/2017 0516   GLUCOSE 130 12/14/2014 1324   GLUCOSE 89 05/26/2012 1111   BUN 15 10/17/2017 0516   BUN 19.6 12/01/2016 1116   CREATININE 0.91 10/17/2017 0516   CREATININE 1.03 03/12/2017 1329   CREATININE 0.9 12/01/2016 1116   CALCIUM 8.4 (L) 10/17/2017 0516   CALCIUM 9.1 12/14/2014 1324   PROT 5.5 (L) 10/12/2017 0801   PROT 6.5 10/08/2017 0905   PROT 6.5 12/14/2014 1324   ALBUMIN 2.9 (L) 10/12/2017 0801   ALBUMIN 3.9 10/08/2017 0905   ALBUMIN 3.4 (L) 12/14/2014 1324   AST 27 10/12/2017 0801   AST 29 12/14/2014 1324   ALT 21 10/12/2017 0801   ALT 20 12/14/2014 1324   ALKPHOS 72 10/12/2017 0801   ALKPHOS 103 12/14/2014 1324   BILITOT 1.1 10/12/2017 0801   BILITOT 0.6 10/08/2017 0905   BILITOT 0.77 12/14/2014 1324   GFRNONAA 56 (L) 10/17/2017 0516   GFRNONAA 49 (L) 03/12/2017 1329   GFRAA >60 10/17/2017 0516   GFRAA 56 (L) 03/12/2017 1329    No  results found for: SPEP, UPEP  Lab Results  Component Value Date   WBC 6.2 10/17/2017   NEUTROABS 4.2 10/17/2017   HGB 9.7 (L) 10/17/2017   HCT 30.2 (L) 10/17/2017   MCV 96.5 10/17/2017   PLT 196 10/17/2017      Chemistry      Component Value Date/Time   NA 140 10/17/2017 0516   NA 142 12/14/2014 1324   K 3.3 (L) 10/17/2017 0516   K 4.2 12/14/2014 1324   CL 104 10/17/2017 0516   CL 105 05/26/2012 1111   CO2 30 10/17/2017 0516   CO2 30 (H) 12/14/2014 1324   BUN 15 10/17/2017 0516   BUN 19.6 12/01/2016 1116   CREATININE 0.91 10/17/2017 0516   CREATININE 1.03 03/12/2017 1329   CREATININE 0.9 12/01/2016 1116      Component Value Date/Time   CALCIUM 8.4 (L) 10/17/2017 0516   CALCIUM 9.1 12/14/2014 1324   ALKPHOS 72 10/12/2017 0801   ALKPHOS 103 12/14/2014 1324   AST 27 10/12/2017 0801   AST 29 12/14/2014 1324   ALT 21 10/12/2017 0801   ALT 20 12/14/2014 1324   BILITOT 1.1 10/12/2017 0801   BILITOT 0.6 10/08/2017 0905   BILITOT 0.77 12/14/2014 1324       RADIOGRAPHIC STUDIES: I have reviewed multiple imaging study with the patient I have personally reviewed the radiological images as listed and agreed with the findings in the report. Dg Chest 1 View  Result Date: 10/15/2017 CLINICAL DATA:  Dyspnea. EXAM: CHEST  1 VIEW COMPARISON:  Radiograph of October 13, 2017. FINDINGS: Stable mild cardiomegaly is noted. Left-sided pacemaker is unchanged in position. Atherosclerosis of thoracic aorta is noted. No pneumothorax is noted. Mild bibasilar subsegmental atelectasis is noted with small associated pleural effusions. Bony thorax is unremarkable. IMPRESSION: Mild bibasilar subsegmental atelectasis with small associated pleural effusions. Aortic Atherosclerosis (ICD10-I70.0). Electronically Signed   By: Marijo Conception, M.D.   On: 10/15/2017 14:29  Ct Abdomen Pelvis W Contrast  Result Date: 10/16/2017 CLINICAL DATA:  Followup left pelvic mass and associated hemorrhage.  Metastatic ovarian cancer. EXAM: CT ABDOMEN AND PELVIS WITH CONTRAST TECHNIQUE: Multidetector CT imaging of the abdomen and pelvis was performed using the standard protocol following bolus administration of intravenous contrast. CONTRAST:  135mL ISOVUE-300 IOPAMIDOL (ISOVUE-300) INJECTION 61% COMPARISON:  10/12/2017. FINDINGS: Lower chest: Enlarged heart. Intracardiac pacemaker leads. Atheromatous calcifications, including the coronary arteries and aorta. Hepatobiliary: Stable 1.9 cm oval area of low density with coarse calcifications in the inferior right lobe of the liver, suggesting previous wedge resection. Cholecystectomy clips. Pancreas: Unremarkable. No pancreatic ductal dilatation or surrounding inflammatory changes. Spleen: Normal in size without focal abnormality. Adrenals/Urinary Tract: The left kidney remains small compared to the right kidney. Cortical scarring in the right kidney is unchanged. Normal appearing adrenal glands and ureters. Anterior aspect of the urinary bladder is extending into the proximal portion of a midline ventral hernia at the inferior pelvis. Stomach/Bowel: Unremarkable stomach, small bowel and colon. No evidence of appendicitis. Vascular/Lymphatic: Atheromatous arterial calcifications without aneurysm. No enlarged lymph nodes. Reproductive: Status post hysterectomy. Other: The previously demonstrated 9.4 x 9.2 cm left anterior pelvic hemorrhage currently measures 9.5 x 7.3 cm and is less dense. More anteriorly located hemorrhage on the left previously measured 15.6 x 4.4 cm and currently measures 17.3 x 2.8 cm on image number 64 series 3. A previously demonstrated 2.8 cm cystic and solid midline anterior peritoneal mass at the level of the upper pelvis measures 3.0 cm in corresponding diameter today. The previously demonstrated 8.2 x 5.6 cm cystic and solid left pelvic mass currently measures 7.8 x 5.8 cm on image number 70 series 3. There is less free peritoneal blood in the  upper abdomen. Anterior hernia repair mesh is again demonstrated. A ventral hernia at the level of the inferior pelvis containing herniated small bowel without obstruction is again demonstrated. This also contains a small amount the urinary bladder, as described above. Musculoskeletal: Lumbar and lower thoracic spine degenerative changes. IMPRESSION: 1. Improving peritoneal hemorrhage. 2. No significant change in cystic and solid left pelvic mass and smaller anterior peritoneal cystic and solid mass compatible with tumor implant. 3. No evidence of metastatic disease elsewhere in the abdomen or pelvis. 4. Cardiomegaly. 5.  Calcific coronary artery and aortic atherosclerosis. Aortic Atherosclerosis (ICD10-I70.0). Electronically Signed   By: Claudie Revering M.D.   On: 10/16/2017 12:32   Ct Abdomen Pelvis W Contrast  Result Date: 10/12/2017 CLINICAL DATA:  Left lower quadrant pain, nausea. History of metastatic ovarian cancer. EXAM: CT ABDOMEN AND PELVIS WITH CONTRAST TECHNIQUE: Multidetector CT imaging of the abdomen and pelvis was performed using the standard protocol following bolus administration of intravenous contrast. CONTRAST:  74mL ISOVUE-300 IOPAMIDOL (ISOVUE-300) INJECTION 61% COMPARISON:  03/24/2017 FINDINGS: Motion degraded images. Lower chest: 11 mm nodule at the right lung base (series 7/image 3), new. Mild right lower lobe atelectasis. Cardiomegaly. Three vessel coronary atherosclerosis. Pacemaker leads, incompletely visualized. Hepatobiliary: 1.9 cm low-density lesion in segment 6 of the liver (series 4/image 30), with associated postsurgical change. Status post cholecystectomy. No intrahepatic or extrahepatic ductal dilatation. Pancreas: Within normal limits. Spleen: Within normal limits. Adrenals/Urinary Tract: Adrenal glands are within normal limits. Left renal cortical scarring/atrophy. Right lower pole cortical scarring (series 4/image 41). No hydronephrosis. Left bladder implants, including a  dominant 2.1 cm lesion (series 4/image 35), previously 1.9 cm. Additional lesion along the left pubic symphysis is poorly visualized on the current study (series 4/image  50). Stomach/Bowel: Stomach is notable for a tiny hiatal hernia. No evidence of bowel obstruction. Normal appendix (series 4/image 47). Vascular/Lymphatic: No evidence of abdominal aortic aneurysm. Atherosclerotic calcifications of the abdominal aorta and branch vessels. No suspicious abdominopelvic lymphadenopathy. Reproductive: Status post hysterectomy. 5.6 x 8.2 cm predominantly cystic mass in the left adnexa/lower pelvis (series 4/image 69), previously 4.7 x 4.5 cm. Right medial aspect of the lesion has hemorrhaged into the extraperitoneum, measuring approximately 9.2 x 9.4 cm superior to the mass (series 4/image 60) with additional fluid/hemorrhage in the anterior abdominal mesentery measuring approximately 4.4 x 15.6 cm (series 4/image 65). Other: Additional peritoneal disease/omental caking beneath the anterior abdominal wall, including a 2.8 cm cystic/solid implant beneath midline anterior abdominal wall mesh (series 4/image 63), previously 2.1 cm. Additional fluid/hemorrhage along the liver, spleen, and right pericolic gutter. Musculoskeletal: Degenerative changes of the visualized thoracolumbar spine. IMPRESSION: Extraperitoneal fluid/hemorrhage related to the patient's dominant left pelvic tumor, as described above. Progression of the patient's metastatic ovarian cancer, as described above. Dominant cystic/solid mass in the left adnexa/lower pelvis has increased in size. Peritoneal disease/omental caking is mildly progressive. New pulmonary nodule at the right lung base, indeterminate. These results were called by telephone at the time of interpretation on 10/12/2017 at 9:36 am to Dr. Aletta Edouard , who verbally acknowledged these results. Electronically Signed   By: Julian Hy M.D.   On: 10/12/2017 09:41   Dg Chest Port 1  View  Result Date: 10/13/2017 CLINICAL DATA:  Shortness of Breath EXAM: PORTABLE CHEST 1 VIEW COMPARISON:  06/29/2017 FINDINGS: Left pacer remains in place, unchanged. Mild cardiomegaly with vascular congestion. Bilateral lower lobe airspace opacities. Small bilateral effusions. Worsening aeration in the bases since prior study. IMPRESSION: Small bilateral effusions with bibasilar atelectasis or infiltrate. Cardiomegaly with vascular congestion. Worsening aeration in the lung bases. Electronically Signed   By: Rolm Baptise M.D.   On: 10/13/2017 09:45    All questions were answered. The patient knows to call the clinic with any problems, questions or concerns. No barriers to learning was detected.  I spent 60 minutes counseling the patient face to face. The total time spent in the appointment was 80 minutes and more than 50% was on counseling and review of test results  Heath Lark, MD 10/20/2017 5:10 PM

## 2017-10-20 NOTE — Assessment & Plan Note (Signed)
She has recent constipation, resolved with laxative We discussed regular use of laxatives as needed.

## 2017-10-20 NOTE — Patient Instructions (Signed)
Dr Denman George has cancelled your CT scan for next week.  You will have labs drawn on Thursday when you come for your chemotherapy teaching class.  Dr Denman George will see you back after 3 cycles (doses) of chemotherapy.

## 2017-10-20 NOTE — Assessment & Plan Note (Addendum)
The patient had never received chemotherapy despite multiple recurrence.  Each of those recurrences are managed with surgical resection. Recent CT imaging were reviewed with the patient extensively. At this point, I recommend palliative chemotherapy.  The patient has significant risk of bowel obstruction. The patient is undecided.  She would like to meet with GYN oncologist first and chemo education class to weigh in the risk, benefits, side effects of treatment If she is interested, I would recommend combination chemotherapy with carboplatin and Taxol However, if she is concerned about significant side effects, single agent carboplatin will reduce dose combination treatment can be attempted I will schedule chemo education class at the end of the week and see her back next week for final consent.  I recommend port placement and influenza vaccination and she agreed to proceed.

## 2017-10-20 NOTE — Progress Notes (Signed)
Follow-up Note: Gyn-Onc  Consult was requested by Dr. Alvy Bimler for the evaluation of Patty [name] 84 y.o. female  CC:  Chief Complaint  Patient presents with  . Malignant granulosa cell tumor of ovary, unspecified lateral    Assessment/Plan:  Patty [name]  is a 84 y.o.  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  Recommend continuing to hold anticoagulant therapy for now until there is regression in her tumors on imaging as these are highly vascular tumors.  Recommend carb/tax vs single agent carb (favor doublet if she tolerates this) for 3 cycles then re-evaluation with scans.  Would restart anticoagulation or antiplatelet therapy (preferably not both) if showing response after 1-2 cycles.  Will see her back after 3 cycles for rescanning and consideration for surgical debulking as this would be associated with the most significant disease free interval.   HPI: Patty [name] is an 84 year old woman who is seen in consultation at the request of Dr Alvy Bimler for granulosa cell ovarian cancer.   The patient's history began in approximately 2000 when she underwent ex lap, TAH, BSO with Dr Raylene Miyamoto at [facility]. She did not receive adjuvant chemotherapy. Postoperatively she had recurrences in the peritoneum in 2004, 2007 and 2011.  The 2004 surgery was with Dr Excell Seltzer and included resection of the recurrent tumor with small bowel resection and colectomy for diverticulosis. She has had a laparoscopic hernia repair with mesh in 2005. In 2007 she underwent resection of pelvic tumor recurrence with Dr Excell Seltzer and ventral hernia repair with mesh.  In 2011 Dr Excell Seltzer performed a laparoscopic resection of suprapubic tumor. Her last CT was in 2010.   She has significant CAD with 6 stents and a history of CHF. She has a pacemaker which she states has a nonfunctioning battery and she is not currently  paced. This was placed for tachycadia-bradycardia syndrome. She is obese with OSA. She has atrial fibrillation and is on coumadin.   In October, 2018 she began noting intermittent right lower quadrant pains. Labs:  CA 125 12/01/16: 35 Inhibin B 11/21/16: 119  CT abd/pelvis 12/09/16: A small suprapubic hernia is seen along the inferior margin of the surgical mass containing a loop of small bowel. No evidence of bowel obstruction or ischemia. Multiple new soft tissue masses are seen in the left suprapubic region and left lower quadrant, consistent with recurrent tumor. Largest in the left suprapubic region measures 3.8 x 2.8 cm, and 3.8 x 3.4 cm in left lower quadrant. There is also a new mass in the central small bowel mesenteric measuring 5.8 x 5.0 cm. These findings are consistent with recurrent carcinoma and peritoneal metastases.  She was provided with 3 options for proceeding with treatment of her recurrence:  1/ surgical resection and hernia repair. This will involve exploratory laparotomy, radical tumor debulking, possible bowel resection, possible partial cystectomy, and hernia repair with Dr Excell Seltzer and myself.  She will require medical clearance as she has significant cardiac issues.  2/ chemotherapy with carboplatin and paclitaxel. I discussed that this may not achieve as long a remission as surgical intervention, but would involve a large radical procedure.   3/ A third alternative is hormonal therapy with progestin and tamoxifen in rotation.  She was not sure about which route to pursue, and given that she was asymptomatic with this slow growing tumor, took some time to reflect. In December, 2018 she received  replacement of her pacemaker and was placed on Plavix and coumadin.  Interval Hx: She has met with her cardiologists, Dr [name] and Dr Irish Lack, who feel that she is a candidate for any of the approaches, however, would prefer until she was 1 year out from her last coronary  catheterization procedure to be taken off anticoagulants.  CT abd/pelvis in February, 2019 showed essentially stable disease with some mild increases in tumor size, and some decreased, but no new lesions.  As she was asymptomatic she elected for expectant management at that time with plan for surgery after she was 1 year out from prior cardiac cath.  On 10/12/17 she was admitted for 5 days with intraperitoneal bleeding from her granulosa cell tumor masses. Her anticoagulant therapy was held as was her plavix and she was transfused. CT imaging on 10/16/17 showed the previously demonstrated 9.4 x 9.2 cm left anterior pelvic hemorrhage currently measures 9.5 x 7.3 cm and is less dense. More anteriorly located hemorrhage on the left previously measures 15.6 x 4.4 cm and currently measures 17.3 x 2.8 cm. A previously demonstrated 2.8 cm cystic and solid midline anterior peritoneal mass at the level of the upper pelvis measures 3.0 cm. The previously demonstrated 8.2 x 5.6 cm cystic and solid left pelvic mass currently measures 7.8 x 5.8 cm. There is less free peritoneal blood in the upper abdomen. Anterior hernia repair mesh is again demonstrated. A ventral hernia at the level of the inferior pelvis containing herniated small bowel without obstruction is again demonstrated. This also contains a small amount the urinary bladder.  Given the progression of her peritoneal disease on imaging, she was felt to be a better candidate for chemotherapy first with carboplatin and paclitaxel, followed by consideration for interval debulking if she demonstrates a good response.     Current Meds:  Outpatient Encounter Medications as of 10/20/2017  Medication Sig  . atorvastatin (LIPITOR) 80 MG tablet TAKE 1 TABLET BY MOUTH EVERY MORNING (Patient taking differently: Take 80 mg by mouth every morning. )  . beta carotene w/minerals (OCUVITE) tablet Take 1 tablet by mouth 2 (two) times daily.   . Calcium Carb-Cholecalciferol  (CALTRATE 600+D) 600-800 MG-UNIT TABS Take 1 tablet by mouth every evening.   . Coenzyme Q-10 100 MG capsule Take 200 mg by mouth daily.   Marland Kitchen diltiazem (CARDIZEM CD) 120 MG 24 hr capsule TAKE ONE CAPSULE BY MOUTH AT BEDTIME (Patient taking differently: Take 120 mg by mouth at bedtime. )  . diltiazem (CARDIZEM CD) 240 MG 24 hr capsule TAKE ONE CAPSULE BY MOUTH DAILY WITH BREAKFAST (Patient taking differently: Take 240 mg by mouth daily. )  . furosemide (LASIX) 40 MG tablet Take 80 mg by mouth daily.  Marland Kitchen HYDROmorphone (DILAUDID) 2 MG tablet Take 1 tablet (2 mg total) by mouth every 4 (four) hours as needed for severe pain.  . isosorbide mononitrate (IMDUR) 60 MG 24 hr tablet TAKE 1 TABLET BY MOUTH DAILY (Patient taking differently: Take 60 mg by mouth daily. )  . KLOR-CON M20 20 MEQ tablet TAKE 1 TABLET BY MOUTH EVERY DAY (Patient taking differently: Take 20 mEq by mouth daily. )  . metoprolol tartrate (LOPRESSOR) 100 MG tablet Take 1 tablet (100 mg total) by mouth every morning. Also takes (3) 25 mg tabs (75 mg) in the evening (Patient taking differently: Take 100 mg by mouth 2 (two) times daily. )  . Multiple Vitamin (MULTIVITAMIN) capsule Take 1 capsule by mouth daily.  . nitroGLYCERIN (NITROSTAT)  0.4 MG SL tablet PLACE 1 TABLET (0.4 MG TOTAL) UNDER THE TONGUE EVERY 5 (FIVE) MINUTES AS NEEDED. FOR CHEST PAIN (Patient taking differently: Place 0.4 mg under the tongue every 5 (five) minutes as needed for chest pain. )  . Omega-3 Fatty Acids (FISH OIL PO) Take 1,200 mg by mouth daily.   . ondansetron (ZOFRAN) 8 MG tablet Take 1 tablet (8 mg total) by mouth every 8 (eight) hours as needed for nausea.  . prochlorperazine (COMPAZINE) 10 MG tablet Take 1 tablet (10 mg total) by mouth every 6 (six) hours as needed for nausea or vomiting.  Marland Kitchen spironolactone (ALDACTONE) 25 MG tablet Take 25 mg by mouth as needed (swelling).   No facility-administered encounter medications on file as of 10/20/2017.     Allergy:   Allergies  Allergen Reactions  . Bee Venom Anaphylaxis  . Other Nausea And Vomiting and Other (See Comments)    Pain medications cause severe vomiting. Tolerated slow IV morphine drip  . Amiodarone Nausea Only  . Prednisone Other (See Comments)    "Rapid Heart Beat"    Social Hx:   Social History   Socioeconomic History  . Marital status: Widowed    Spouse name: Not on file  . Number of children: 4  . Years of education: Not on file  . Highest education level: Not on file  Occupational History  . Occupation: Retired Nurse, learning disability estate  Social Needs  . Financial resource strain: Not on file  . Food insecurity:    Worry: Not on file    Inability: Not on file  . Transportation needs:    Medical: Not on file    Non-medical: Not on file  Tobacco Use  . Smoking status: Former Smoker    Packs/day: 1.00    Years: 32.00    Pack years: 32.00    Types: Cigarettes    Last attempt to quit: 02/03/1974    Years since quitting: 43.7  . Smokeless tobacco: Never Used  Substance and Sexual Activity  . Alcohol use: Yes    Alcohol/week: 5.0 standard drinks    Types: 5 Glasses of wine per week    Comment: 2017 WINE WITH DINNER   . Drug use: No  . Sexual activity: Never  Lifestyle  . Physical activity:    Days per week: Not on file    Minutes per session: Not on file  . Stress: Not on file  Relationships  . Social connections:    Talks on phone: Not on file    Gets together: Not on file    Attends religious service: Not on file    Active member of club or organization: Not on file    Attends meetings of clubs or organizations: Not on file    Relationship status: Not on file  . Intimate partner violence:    Fear of current or ex partner: Not on file    Emotionally abused: Not on file    Physically abused: Not on file    Forced sexual activity: Not on file  Other Topics Concern  . Not on file  Social History Narrative   Lives with family.    Past Surgical Hx:  Past  Surgical History:  Procedure Laterality Date  . ABDOMINAL HYSTERECTOMY    . CARDIAC CATHETERIZATION  09/03/2007   EF 70%; Failed attempt at PCI to OM  . CARDIAC CATHETERIZATION  11/01/2003   EF 70%  . CARDIAC CATHETERIZATION N/A 12/14/2015   Procedure: Left  Heart Cath and Coronary Angiography;  Surgeon: Burnell Blanks, MD;  Location: [place] CV LAB;  Service: Cardiovascular;  Laterality: N/A;  . CARDIOVERSION  12/31/2011   Procedure: CARDIOVERSION;  Surgeon: [name], MD;  Location: [facility] ENDOSCOPY;  Service: Cardiovascular;  Laterality: N/A;  . CARDIOVERSION N/A 12/31/2011   Procedure: CARDIOVERSION;  Surgeon: [name], MD;  Location: [facility] CATH LAB;  Service: Cardiovascular;  Laterality: N/A;  . CATARACT EXTRACTION, BILATERAL  2015  . CHOLECYSTECTOMY  1980's  . COLON SURGERY  2004   colectomy for diverticulosis  . CORONARY ANGIOPLASTY WITH STENT PLACEMENT  2000; 08/11/2012; 11/12/2012   3 + 2 LAD & CFX; 2nd CFX stent 11/12/2012  . CORONARY ATHERECTOMY N/A 07/15/2016   Procedure: Coronary Atherectomy;  Surgeon: Martinique, Peter M, MD;  Location: [place] [facility] LAB;  Service: Cardiovascular;  Laterality: N/A;  . CORONARY BALLOON ANGIOPLASTY N/A 07/14/2016   Procedure: Coronary Balloon Angioplasty;  Surgeon: Martinique, Peter M, MD;  Location: [place] [facility] LAB;  Service: Cardiovascular;  Laterality: N/A;  . CORONARY STENT INTERVENTION N/A 07/15/2016   Procedure: Coronary Stent Intervention;  Surgeon: Martinique, Peter M, MD;  Location: [place] CV LAB;  Service: Cardiovascular;  Laterality: N/A;  . FRACTIONAL FLOW RESERVE WIRE  10/07/2013   Procedure: [place];  Surgeon: [name], MD;  Location: [facility] CATH LAB;  Service: Cardiovascular;;  . granulosa tumor excision  2000; 2003; 2004; 2007   "all in my abdomen including small intestines, outside my ?uterus/etc" (08/11/2012)  . HERNIA REPAIR  2005   "laparoscopic"  . LEFT HEART CATH AND CORONARY  ANGIOGRAPHY N/A 07/14/2016   Procedure: Left Heart Cath and Coronary Angiography;  Surgeon: Martinique, Peter M, MD;  Location: [place] [facility] LAB;  Service: Cardiovascular;  Laterality: N/A;  . LEFT HEART CATHETERIZATION WITH CORONARY ANGIOGRAM N/A 11/12/2012   Procedure: LEFT HEART CATHETERIZATION WITH CORONARY ANGIOGRAM;  Surgeon: [name], MD;  Location: [facility] CATH LAB;  Service: Cardiovascular;  Laterality: N/A;  . LEFT HEART CATHETERIZATION WITH CORONARY ANGIOGRAM N/A 10/07/2013   Procedure: LEFT HEART CATHETERIZATION WITH CORONARY ANGIOGRAM;  Surgeon: [name], MD;  Location: [facility] CATH LAB;  Service: Cardiovascular;  Laterality: N/A;  . LEFT HEART CATHETERIZATION WITH CORONARY ANGIOGRAM N/A 12/14/2013   Procedure: LEFT HEART CATHETERIZATION WITH CORONARY ANGIOGRAM;  Surgeon: Sinclair Grooms, MD;  Location: [facility] CATH LAB;  Service: Cardiovascular;  Laterality: N/A;  . LEFT HEART CATHETERIZATION WITH CORONARY ANGIOGRAM N/A 05/16/2014   Procedure: LEFT HEART CATHETERIZATION WITH CORONARY ANGIOGRAM;  Surgeon: Sherren Mocha, MD;  Location: [facility] CATH LAB;  Service: Cardiovascular;  Laterality: N/A;  . PERCUTANEOUS CORONARY INTERVENTION-BALLOON ONLY  08/04/2012   Procedure: PERCUTANEOUS CORONARY INTERVENTION-BALLOON ONLY;  Surgeon: [name], MD;  Location: [facility] CATH LAB;  Service: Cardiovascular;;  . PERCUTANEOUS CORONARY ROTOBLATOR INTERVENTION (PCI-R) N/A 08/11/2012   Procedure: PERCUTANEOUS CORONARY ROTOBLATOR INTERVENTION (PCI-R);  Surgeon: [name], MD;  Location: [facility] CATH LAB;  Service: Cardiovascular;  Laterality: N/A;  . PERCUTANEOUS CORONARY STENT INTERVENTION (PCI-S)  10/07/2013   Procedure: PERCUTANEOUS CORONARY STENT INTERVENTION (PCI-S);  Surgeon: [name], MD;  Location: [facility] CATH LAB;  Service: Cardiovascular;;  . PERMANENT PACEMAKER INSERTION N/A 03/16/2012   [name] (SJM) leadless pacemaker (LEADLESS II STUDY PATEINT)  . SALIVARY GLAND SURGERY  2000's    "had a little lump removed; granulosa related; it was benign" (08/11/2012)  . TEE WITHOUT CARDIOVERSION  12/31/2011   Procedure: TRANSESOPHAGEAL ECHOCARDIOGRAM (TEE);  Surgeon: [name], MD;  Location: [place];  Service: Cardiovascular;  Laterality: N/A;  . VARICOSE VEIN SURGERY Bilateral 1977    Past Medical Hx:  Past Medical History:  Diagnosis Date  . Chronic anticoagulation - coumadin, CHADS2VASC=6 05/17/2015  . CKD (chronic kidney disease), stage III ([place])   . Combined systolic and diastolic heart failure ([place])   . Coronary artery disease    a. s/p multiple stents - stenting x 2 to the LAD and x 1 to the LCx in 2000, rotational arthrectomy to proximal LCx and DES to LAD in 2014 and DES to LCx in 2014, along with DES to LAD for re-in-stent stenosis in 2015, and DES to ostial LAD in 2016. b. 07/2016 - orbital atherectomy & DES to mid LCx.  . Diverticulitis   . Granulosa cell carcinoma ([place])    abd; last episode was in 2009  . Hyperlipidemia   . Hypertension   . LBBB (left bundle branch block)       . Myocardial infarction ([place]) 2002  . Obesity   . OSA on CPAP   . Pacemaker failure    a. Prior leadless PPM with premature battery failure, being managed conservatively without replacement.  . Peripheral vascular disease ([place])   . Permanent atrial fibrillation ([place]) 2013  . Renal artery stenosis ([place])   . Tachycardia-bradycardia syndrome ([place])    a. s/p leadless pacemaker ([name]) implanted by Dr [name]  . Varicose veins     Past Gynecological History:  Granulosa cell tumor of the ovary No LMP recorded. Patient has had a hysterectomy.  Family Hx:  Family History  Problem Relation Age of Onset  . Stroke Mother   . Heart attack Father   . Diabetes Father   . Hypertension Father   . Heart attack Brother   . Diabetes Brother   . Hypertension Brother   . Kidney failure Brother     Review of Systems:  Constitutional  Feels well,    ENT Normal appearing  ears and nares bilaterally Skin/Breast  No rash, sores, jaundice, itching, dryness Cardiovascular  No chest pain, shortness of breath, or edema  Pulmonary  No cough or wheeze.  Gastro Intestinal  No nausea, vomitting, or diarrhoea. No bright red blood per rectum, no abdominal pain, change in bowel movement, or constipation.  Genito Urinary  No frequency, urgency, dysuria, + pelvic pain Musculo Skeletal  No myalgia, arthralgia, joint swelling or pain  Neurologic  No weakness, numbness, change in gait,  Psychology  No depression, anxiety, insomnia.   Vitals:  Blood pressure (!) 141/56, pulse 86, temperature 98.4 F (36.9 C), temperature source Oral, resp. rate 18, height 5' (1.524 m), weight 178 lb (80.7 kg), SpO2 96 %.  Physical Exam: WD in NAD Neck  Supple NROM, without any enlargements.  Lymph Node Survey No cervical supraclavicular or inguinal adenopathy Cardiovascular  Pulse normal rate, regularity and rhythm. S1 and S2 normal.  Lungs  Clear to auscultation bilateraly, without wheezes/crackles/rhonchi. Good air movement.  Skin  No rash/lesions/breakdown  Psychiatry  Alert and oriented to person, place, and time  Abdomen  Normoactive bowel sounds, abdomen soft, non-tender and obese without evidence of hernia.  Back No CVA tenderness Genito Urinary  Vulva/vagina: Normal external female genitalia.   No lesions. No discharge or bleeding.  Bladder/urethra:  No lesions or masses, well supported bladder  Vagina: normal, surgically absent uterus and cervix.   Adnexa: no palpable masses. Rectal  Good tone, no masses no cul  de sac nodularity.  Extremities  No bilateral cyanosis, clubbing or edema.  Thereasa Solo, MD  10/20/2017, 4:38 PM

## 2017-10-20 NOTE — Assessment & Plan Note (Signed)
She has baseline postherpetic neuralgia and will be sensitive to side effects of chemotherapy causing neuropathy I will consider gabapentin in the next visit

## 2017-10-20 NOTE — Assessment & Plan Note (Signed)
Currently, she has no signs or symptoms of severe congestive heart failure I recommend her to continue medical management and close follow-up with her cardiologist.  The patient was taken off anticoagulation therapy due to recent retroperitoneal bleed I would defer to her cardiologist to discuss when to resume her treatment.

## 2017-10-20 NOTE — Telephone Encounter (Signed)
Returned called to pt  and she states that she was admitted from 9/9 to 9/14 for intra-abdominal bleed. Thus, wanted to inform us that her Plavix and Coumadin were discontinued at discharge. Thus, her next appt was cancelled.

## 2017-10-20 NOTE — Assessment & Plan Note (Signed)
The patient has significant major comorbidities and is physically debilitated with recent hospitalization I recommend advanced home care service with RN visit and evaluation.  She agree with the plan of care

## 2017-10-21 ENCOUNTER — Encounter (HOSPITAL_COMMUNITY): Payer: Self-pay

## 2017-10-22 ENCOUNTER — Inpatient Hospital Stay: Payer: Medicare Other

## 2017-10-22 ENCOUNTER — Ambulatory Visit (HOSPITAL_COMMUNITY): Payer: Medicare Other

## 2017-10-22 DIAGNOSIS — E440 Moderate protein-calorie malnutrition: Secondary | ICD-10-CM | POA: Diagnosis not present

## 2017-10-22 DIAGNOSIS — Z90710 Acquired absence of both cervix and uterus: Secondary | ICD-10-CM | POA: Diagnosis not present

## 2017-10-22 DIAGNOSIS — C569 Malignant neoplasm of unspecified ovary: Secondary | ICD-10-CM | POA: Diagnosis not present

## 2017-10-22 DIAGNOSIS — Z23 Encounter for immunization: Secondary | ICD-10-CM | POA: Diagnosis not present

## 2017-10-22 DIAGNOSIS — D3910 Neoplasm of uncertain behavior of unspecified ovary: Secondary | ICD-10-CM

## 2017-10-22 DIAGNOSIS — Z87891 Personal history of nicotine dependence: Secondary | ICD-10-CM | POA: Diagnosis not present

## 2017-10-22 DIAGNOSIS — Z90722 Acquired absence of ovaries, bilateral: Secondary | ICD-10-CM | POA: Diagnosis not present

## 2017-10-22 LAB — COMPREHENSIVE METABOLIC PANEL
ALK PHOS: 109 U/L (ref 38–126)
ALT: 42 U/L (ref 0–44)
ANION GAP: 6 (ref 5–15)
AST: 54 U/L — ABNORMAL HIGH (ref 15–41)
Albumin: 2.8 g/dL — ABNORMAL LOW (ref 3.5–5.0)
BILIRUBIN TOTAL: 1 mg/dL (ref 0.3–1.2)
BUN: 17 mg/dL (ref 8–23)
CALCIUM: 9.2 mg/dL (ref 8.9–10.3)
CO2: 35 mmol/L — ABNORMAL HIGH (ref 22–32)
Chloride: 102 mmol/L (ref 98–111)
Creatinine, Ser: 0.94 mg/dL (ref 0.44–1.00)
GFR calc Af Amer: >60 mL/min (ref >60)
GFR, EST NON AFRICAN AMERICAN: 54 mL/min — AB (ref >60)
GLUCOSE: 98 mg/dL (ref 70–99)
Potassium: 3.4 mmol/L — ABNORMAL LOW (ref 3.5–5.1)
SODIUM: 143 mmol/L (ref 135–145)
TOTAL PROTEIN: 6.7 g/dL (ref 6.5–8.1)

## 2017-10-22 LAB — CBC WITH DIFFERENTIAL/PLATELET
Basophils Absolute: 0.1 10*3/uL (ref 0.0–0.1)
Basophils Relative: 1 %
EOS ABS: 0.2 10*3/uL (ref 0.0–0.5)
EOS PCT: 3 %
HEMATOCRIT: 33 % — AB (ref 34.8–46.6)
HEMOGLOBIN: 11.2 g/dL — AB (ref 11.6–15.9)
LYMPHS PCT: 13 %
Lymphs Abs: 0.9 10*3/uL (ref 0.9–3.3)
MCH: 31.5 pg (ref 25.1–34.0)
MCHC: 33.8 g/dL (ref 31.5–36.0)
MCV: 93.4 fL (ref 79.5–101.0)
MONO ABS: 0.8 10*3/uL (ref 0.1–0.9)
Monocytes Relative: 12 %
NEUTROS ABS: 4.9 10*3/uL (ref 1.5–6.5)
Neutrophils Relative %: 71 %
Platelets: 340 10*3/uL (ref 145–400)
RBC: 3.54 MIL/uL — ABNORMAL LOW (ref 3.70–5.45)
RDW: 15.9 % — ABNORMAL HIGH (ref 11.2–14.5)
WBC: 6.8 10*3/uL (ref 3.9–10.3)

## 2017-10-23 ENCOUNTER — Telehealth: Payer: Self-pay | Admitting: Hematology and Oncology

## 2017-10-23 ENCOUNTER — Telehealth: Payer: Self-pay

## 2017-10-23 ENCOUNTER — Encounter (HOSPITAL_COMMUNITY): Payer: Self-pay

## 2017-10-23 ENCOUNTER — Ambulatory Visit (HOSPITAL_COMMUNITY): Admission: RE | Admit: 2017-10-23 | Payer: Medicare Other | Source: Ambulatory Visit

## 2017-10-23 DIAGNOSIS — Z7901 Long term (current) use of anticoagulants: Secondary | ICD-10-CM | POA: Diagnosis not present

## 2017-10-23 DIAGNOSIS — G4733 Obstructive sleep apnea (adult) (pediatric): Secondary | ICD-10-CM | POA: Diagnosis not present

## 2017-10-23 DIAGNOSIS — N183 Chronic kidney disease, stage 3 (moderate): Secondary | ICD-10-CM | POA: Diagnosis not present

## 2017-10-23 DIAGNOSIS — I5040 Unspecified combined systolic (congestive) and diastolic (congestive) heart failure: Secondary | ICD-10-CM | POA: Diagnosis not present

## 2017-10-23 DIAGNOSIS — I130 Hypertensive heart and chronic kidney disease with heart failure and stage 1 through stage 4 chronic kidney disease, or unspecified chronic kidney disease: Secondary | ICD-10-CM | POA: Diagnosis not present

## 2017-10-23 DIAGNOSIS — C569 Malignant neoplasm of unspecified ovary: Secondary | ICD-10-CM | POA: Diagnosis not present

## 2017-10-23 DIAGNOSIS — B0229 Other postherpetic nervous system involvement: Secondary | ICD-10-CM | POA: Diagnosis not present

## 2017-10-23 DIAGNOSIS — E785 Hyperlipidemia, unspecified: Secondary | ICD-10-CM | POA: Diagnosis not present

## 2017-10-23 DIAGNOSIS — I2510 Atherosclerotic heart disease of native coronary artery without angina pectoris: Secondary | ICD-10-CM | POA: Diagnosis not present

## 2017-10-23 DIAGNOSIS — G893 Neoplasm related pain (acute) (chronic): Secondary | ICD-10-CM | POA: Diagnosis not present

## 2017-10-23 DIAGNOSIS — I739 Peripheral vascular disease, unspecified: Secondary | ICD-10-CM | POA: Diagnosis not present

## 2017-10-23 DIAGNOSIS — K5793 Diverticulitis of intestine, part unspecified, without perforation or abscess with bleeding: Secondary | ICD-10-CM | POA: Diagnosis not present

## 2017-10-23 DIAGNOSIS — Z7982 Long term (current) use of aspirin: Secondary | ICD-10-CM | POA: Diagnosis not present

## 2017-10-23 DIAGNOSIS — Z87891 Personal history of nicotine dependence: Secondary | ICD-10-CM | POA: Diagnosis not present

## 2017-10-23 DIAGNOSIS — I482 Chronic atrial fibrillation: Secondary | ICD-10-CM | POA: Diagnosis not present

## 2017-10-23 DIAGNOSIS — Z955 Presence of coronary angioplasty implant and graft: Secondary | ICD-10-CM | POA: Diagnosis not present

## 2017-10-23 NOTE — Telephone Encounter (Signed)
Appts scheduled patient notified per 9/20 sch msg

## 2017-10-23 NOTE — Telephone Encounter (Signed)
She called and left a message to call her. She has her port appt 2/33 and has a conflict with appts on 6/12. Scheduling message sent.

## 2017-10-26 ENCOUNTER — Encounter (HOSPITAL_COMMUNITY): Payer: Self-pay

## 2017-10-26 ENCOUNTER — Other Ambulatory Visit: Payer: Self-pay | Admitting: Hematology and Oncology

## 2017-10-26 DIAGNOSIS — C569 Malignant neoplasm of unspecified ovary: Secondary | ICD-10-CM

## 2017-10-27 ENCOUNTER — Inpatient Hospital Stay: Payer: Medicare Other

## 2017-10-27 ENCOUNTER — Other Ambulatory Visit: Payer: Self-pay | Admitting: Radiology

## 2017-10-27 ENCOUNTER — Inpatient Hospital Stay (HOSPITAL_BASED_OUTPATIENT_CLINIC_OR_DEPARTMENT_OTHER): Payer: Medicare Other | Admitting: Hematology and Oncology

## 2017-10-27 ENCOUNTER — Telehealth: Payer: Self-pay | Admitting: Hematology and Oncology

## 2017-10-27 VITALS — BP 156/69 | HR 84 | Temp 97.8°F | Resp 18 | Ht 60.0 in | Wt 177.6 lb

## 2017-10-27 DIAGNOSIS — Z23 Encounter for immunization: Secondary | ICD-10-CM | POA: Diagnosis not present

## 2017-10-27 DIAGNOSIS — G893 Neoplasm related pain (acute) (chronic): Secondary | ICD-10-CM

## 2017-10-27 DIAGNOSIS — Z87891 Personal history of nicotine dependence: Secondary | ICD-10-CM | POA: Diagnosis not present

## 2017-10-27 DIAGNOSIS — Z90722 Acquired absence of ovaries, bilateral: Secondary | ICD-10-CM | POA: Diagnosis not present

## 2017-10-27 DIAGNOSIS — Z79899 Other long term (current) drug therapy: Secondary | ICD-10-CM | POA: Diagnosis not present

## 2017-10-27 DIAGNOSIS — D3910 Neoplasm of uncertain behavior of unspecified ovary: Secondary | ICD-10-CM

## 2017-10-27 DIAGNOSIS — B0229 Other postherpetic nervous system involvement: Secondary | ICD-10-CM

## 2017-10-27 DIAGNOSIS — I5040 Unspecified combined systolic (congestive) and diastolic (congestive) heart failure: Secondary | ICD-10-CM

## 2017-10-27 DIAGNOSIS — C569 Malignant neoplasm of unspecified ovary: Secondary | ICD-10-CM

## 2017-10-27 DIAGNOSIS — K5909 Other constipation: Secondary | ICD-10-CM

## 2017-10-27 DIAGNOSIS — E440 Moderate protein-calorie malnutrition: Secondary | ICD-10-CM | POA: Diagnosis not present

## 2017-10-27 DIAGNOSIS — R5381 Other malaise: Secondary | ICD-10-CM

## 2017-10-27 DIAGNOSIS — Z90710 Acquired absence of both cervix and uterus: Secondary | ICD-10-CM | POA: Diagnosis not present

## 2017-10-27 LAB — COMPREHENSIVE METABOLIC PANEL
ALK PHOS: 101 U/L (ref 38–126)
ALT: 29 U/L (ref 0–44)
ANION GAP: 9 (ref 5–15)
AST: 30 U/L (ref 15–41)
Albumin: 2.9 g/dL — ABNORMAL LOW (ref 3.5–5.0)
BUN: 16 mg/dL (ref 8–23)
CALCIUM: 9.2 mg/dL (ref 8.9–10.3)
CO2: 31 mmol/L (ref 22–32)
Chloride: 103 mmol/L (ref 98–111)
Creatinine, Ser: 0.87 mg/dL (ref 0.44–1.00)
GFR, EST NON AFRICAN AMERICAN: 59 mL/min — AB (ref >60)
Glucose, Bld: 109 mg/dL — ABNORMAL HIGH (ref 70–99)
Potassium: 3.6 mmol/L (ref 3.5–5.1)
Sodium: 143 mmol/L (ref 135–145)
Total Bilirubin: 0.8 mg/dL (ref 0.3–1.2)
Total Protein: 6.7 g/dL (ref 6.5–8.1)

## 2017-10-27 LAB — CBC WITH DIFFERENTIAL/PLATELET
Basophils Absolute: 0 10*3/uL (ref 0.0–0.1)
Basophils Relative: 1 %
EOS PCT: 2 %
Eosinophils Absolute: 0.2 10*3/uL (ref 0.0–0.5)
HCT: 36.4 % (ref 34.8–46.6)
HEMOGLOBIN: 12 g/dL (ref 11.6–15.9)
LYMPHS ABS: 0.9 10*3/uL (ref 0.9–3.3)
Lymphocytes Relative: 12 %
MCH: 30.5 pg (ref 25.1–34.0)
MCHC: 33 g/dL (ref 31.5–36.0)
MCV: 92.5 fL (ref 79.5–101.0)
MONO ABS: 0.7 10*3/uL (ref 0.1–0.9)
MONOS PCT: 10 %
NEUTROS PCT: 75 %
Neutro Abs: 5.5 10*3/uL (ref 1.5–6.5)
Platelets: 355 10*3/uL (ref 145–400)
RBC: 3.94 MIL/uL (ref 3.70–5.45)
RDW: 16 % — ABNORMAL HIGH (ref 11.2–14.5)
WBC: 7.3 10*3/uL (ref 3.9–10.3)

## 2017-10-27 LAB — INHIBIN B: Inhibin B: 98.4 pg/mL — ABNORMAL HIGH (ref 0.0–16.9)

## 2017-10-27 MED ORDER — LIDOCAINE-PRILOCAINE 2.5-2.5 % EX CREA: 1.0000 "application " | TOPICAL_CREAM | CUTANEOUS | 6 refills | Status: DC | PRN

## 2017-10-27 MED ORDER — GABAPENTIN 300 MG PO CAPS: 300.0000 mg | ORAL_CAPSULE | Freq: Two times a day (BID) | ORAL | 9 refills | Status: DC

## 2017-10-27 NOTE — Telephone Encounter (Signed)
Gave pt avs and calendar  °

## 2017-10-28 ENCOUNTER — Encounter (HOSPITAL_COMMUNITY): Payer: Self-pay

## 2017-10-28 ENCOUNTER — Inpatient Hospital Stay: Payer: Medicare Other | Admitting: Hematology and Oncology

## 2017-10-28 ENCOUNTER — Encounter: Payer: Self-pay | Admitting: Hematology and Oncology

## 2017-10-28 ENCOUNTER — Ambulatory Visit (HOSPITAL_COMMUNITY)
Admission: RE | Admit: 2017-10-28 | Discharge: 2017-10-28 | Disposition: A | Discharge status: Home / Self Care | Payer: Medicare Other | Source: Ambulatory Visit | Attending: Hematology and Oncology | Admitting: Hematology and Oncology

## 2017-10-28 ENCOUNTER — Inpatient Hospital Stay: Payer: Medicare Other

## 2017-10-28 DIAGNOSIS — Z9841 Cataract extraction status, right eye: Secondary | ICD-10-CM | POA: Insufficient documentation

## 2017-10-28 DIAGNOSIS — Z9049 Acquired absence of other specified parts of digestive tract: Secondary | ICD-10-CM | POA: Diagnosis not present

## 2017-10-28 DIAGNOSIS — I252 Old myocardial infarction: Secondary | ICD-10-CM | POA: Diagnosis not present

## 2017-10-28 DIAGNOSIS — I5042 Chronic combined systolic (congestive) and diastolic (congestive) heart failure: Secondary | ICD-10-CM | POA: Diagnosis not present

## 2017-10-28 DIAGNOSIS — Z91030 Bee allergy status: Secondary | ICD-10-CM | POA: Diagnosis not present

## 2017-10-28 DIAGNOSIS — Z90710 Acquired absence of both cervix and uterus: Secondary | ICD-10-CM | POA: Diagnosis not present

## 2017-10-28 DIAGNOSIS — I130 Hypertensive heart and chronic kidney disease with heart failure and stage 1 through stage 4 chronic kidney disease, or unspecified chronic kidney disease: Secondary | ICD-10-CM | POA: Insufficient documentation

## 2017-10-28 DIAGNOSIS — G4733 Obstructive sleep apnea (adult) (pediatric): Secondary | ICD-10-CM | POA: Diagnosis not present

## 2017-10-28 DIAGNOSIS — Z79899 Other long term (current) drug therapy: Secondary | ICD-10-CM | POA: Diagnosis not present

## 2017-10-28 DIAGNOSIS — Z6834 Body mass index (BMI) 34.0-34.9, adult: Secondary | ICD-10-CM | POA: Insufficient documentation

## 2017-10-28 DIAGNOSIS — C562 Malignant neoplasm of left ovary: Secondary | ICD-10-CM | POA: Insufficient documentation

## 2017-10-28 DIAGNOSIS — Z5111 Encounter for antineoplastic chemotherapy: Secondary | ICD-10-CM | POA: Diagnosis not present

## 2017-10-28 DIAGNOSIS — Z7901 Long term (current) use of anticoagulants: Secondary | ICD-10-CM | POA: Diagnosis not present

## 2017-10-28 DIAGNOSIS — Z9842 Cataract extraction status, left eye: Secondary | ICD-10-CM | POA: Insufficient documentation

## 2017-10-28 DIAGNOSIS — Z955 Presence of coronary angioplasty implant and graft: Secondary | ICD-10-CM | POA: Diagnosis not present

## 2017-10-28 DIAGNOSIS — E785 Hyperlipidemia, unspecified: Secondary | ICD-10-CM | POA: Diagnosis not present

## 2017-10-28 DIAGNOSIS — Z87891 Personal history of nicotine dependence: Secondary | ICD-10-CM | POA: Insufficient documentation

## 2017-10-28 DIAGNOSIS — I447 Left bundle-branch block, unspecified: Secondary | ICD-10-CM | POA: Insufficient documentation

## 2017-10-28 DIAGNOSIS — Z888 Allergy status to other drugs, medicaments and biological substances status: Secondary | ICD-10-CM | POA: Diagnosis not present

## 2017-10-28 DIAGNOSIS — C569 Malignant neoplasm of unspecified ovary: Secondary | ICD-10-CM | POA: Diagnosis not present

## 2017-10-28 DIAGNOSIS — I482 Chronic atrial fibrillation: Secondary | ICD-10-CM | POA: Diagnosis not present

## 2017-10-28 DIAGNOSIS — I2510 Atherosclerotic heart disease of native coronary artery without angina pectoris: Secondary | ICD-10-CM | POA: Insufficient documentation

## 2017-10-28 DIAGNOSIS — E669 Obesity, unspecified: Secondary | ICD-10-CM | POA: Diagnosis not present

## 2017-10-28 DIAGNOSIS — N183 Chronic kidney disease, stage 3 (moderate): Secondary | ICD-10-CM | POA: Diagnosis not present

## 2017-10-28 DIAGNOSIS — Z9889 Other specified postprocedural states: Secondary | ICD-10-CM | POA: Insufficient documentation

## 2017-10-28 DIAGNOSIS — J90 Pleural effusion, not elsewhere classified: Secondary | ICD-10-CM | POA: Diagnosis not present

## 2017-10-28 DIAGNOSIS — Z823 Family history of stroke: Secondary | ICD-10-CM | POA: Insufficient documentation

## 2017-10-28 DIAGNOSIS — Z950 Presence of cardiac pacemaker: Secondary | ICD-10-CM | POA: Diagnosis not present

## 2017-10-28 DIAGNOSIS — E440 Moderate protein-calorie malnutrition: Secondary | ICD-10-CM | POA: Insufficient documentation

## 2017-10-28 DIAGNOSIS — Z452 Encounter for adjustment and management of vascular access device: Secondary | ICD-10-CM | POA: Diagnosis not present

## 2017-10-28 DIAGNOSIS — Z8249 Family history of ischemic heart disease and other diseases of the circulatory system: Secondary | ICD-10-CM | POA: Insufficient documentation

## 2017-10-28 DIAGNOSIS — Z841 Family history of disorders of kidney and ureter: Secondary | ICD-10-CM | POA: Insufficient documentation

## 2017-10-28 HISTORY — PX: IR IMAGING GUIDED PORT INSERTION: IMG5740

## 2017-10-28 LAB — PROTIME-INR
INR: 1.1
Prothrombin Time: 14.1 seconds (ref 11.4–15.2)

## 2017-10-28 LAB — CBC
HCT: 39 % (ref 36.0–46.0)
HEMOGLOBIN: 12.1 g/dL (ref 12.0–15.0)
MCH: 30.3 pg (ref 26.0–34.0)
MCHC: 31 g/dL (ref 30.0–36.0)
MCV: 97.5 fL (ref 78.0–100.0)
Platelets: 403 10*3/uL — ABNORMAL HIGH (ref 150–400)
RBC: 4 MIL/uL (ref 3.87–5.11)
RDW: 15.5 % (ref 11.5–15.5)
WBC: 7.5 10*3/uL (ref 4.0–10.5)

## 2017-10-28 LAB — APTT: aPTT: 30 seconds (ref 24–36)

## 2017-10-28 MED ORDER — MIDAZOLAM HCL 2 MG/2ML IJ SOLN
INTRAMUSCULAR | Status: AC
  Filled 2017-10-28: qty 2

## 2017-10-28 MED ORDER — FENTANYL CITRATE (PF) 100 MCG/2ML IJ SOLN
INTRAMUSCULAR | Status: AC | PRN
  Administered 2017-10-28: 25 ug via INTRAVENOUS

## 2017-10-28 MED ORDER — SODIUM CHLORIDE 0.9 % IV SOLN
INTRAVENOUS | Status: DC
  Administered 2017-10-28: 11:00:00 via INTRAVENOUS

## 2017-10-28 MED ORDER — MIDAZOLAM HCL 2 MG/2ML IJ SOLN
INTRAMUSCULAR | Status: AC | PRN
  Administered 2017-10-28: 1 mg via INTRAVENOUS

## 2017-10-28 MED ORDER — LIDOCAINE HCL 1 % IJ SOLN
INTRAMUSCULAR | Status: AC
  Filled 2017-10-28: qty 20

## 2017-10-28 MED ORDER — CEFAZOLIN SODIUM-DEXTROSE 2-4 GM/100ML-% IV SOLN
INTRAVENOUS | Status: AC
  Filled 2017-10-28: qty 100

## 2017-10-28 MED ORDER — CEFAZOLIN SODIUM-DEXTROSE 2-4 GM/100ML-% IV SOLN
2.0000 g | Freq: Once | INTRAVENOUS | Status: AC
  Administered 2017-10-28: 2 g via INTRAVENOUS

## 2017-10-28 MED ORDER — FENTANYL CITRATE (PF) 100 MCG/2ML IJ SOLN
INTRAMUSCULAR | Status: AC
  Filled 2017-10-28: qty 2

## 2017-10-28 MED ORDER — HEPARIN SOD (PORK) LOCK FLUSH 100 UNIT/ML IV SOLN
INTRAVENOUS | Status: AC
  Filled 2017-10-28: qty 5

## 2017-10-28 MED ORDER — ONDANSETRON HCL 4 MG/2ML IJ SOLN
INTRAMUSCULAR | Status: AC
  Filled 2017-10-28: qty 2

## 2017-10-28 MED ORDER — LIDOCAINE HCL 1 % IJ SOLN
INTRAMUSCULAR | Status: AC | PRN
  Administered 2017-10-28: 15 mL

## 2017-10-28 NOTE — Assessment & Plan Note (Signed)
She has some residual postherpetic neuralgia.  I do not believe carboplatin will cause significant neuropathy.  Observe only.

## 2017-10-28 NOTE — Progress Notes (Signed)
[facility] OFFICE PROGRESS NOTE  Patient Care Team: [name], [name], [name], MD as Consulting Physician (Cardiology)  ASSESSMENT & PLAN:  Granulosa cell tumor of ovary I reviewed the guidelines and discussed with the patient extensively about the risk, benefits, side effects of carboplatin only versus carboplatin with Taxol.  Ultimately, the patient would like to proceed with single agent carboplatin only. We discussed the role of chemotherapy. The intent is of palliative intent.  We discussed some of the risks, benefits, side-effects of carboplatin  Some of the short term side-effects included, though not limited to, including weight loss, life threatening infections, risk of allergic reactions, need for transfusions of blood products, nausea, vomiting, change in bowel habits, loss of hair, admission to hospital for various reasons, and risks of death.   Long term side-effects are also discussed including risks of infertility, permanent damage to nerve function, hearing loss, chronic fatigue, kidney damage with possibility needing hemodialysis, and rare secondary malignancy including bone marrow disorders.  The patient is aware that the response rates discussed earlier is not guaranteed.  After a long discussion, patient made an informed decision to proceed with the prescribed plan of care.   Patient education material was dispensed. I recommend AUC of 5 given her age and other comorbidities. The patient has sensitivity to steroids.  I plan to reduce premedication IV dexamethasone. She will get port placement before proceed with chemotherapy.    Protein-calorie malnutrition, moderate ([place]) She has moderate protein calorie malnutrition due to recent hospitalization and bleeding She is recovering well.  She will continue diet supplement as tolerated.  Postherpetic  neuralgia She has some residual postherpetic neuralgia.  I do not believe carboplatin will cause significant neuropathy.  Observe only.  Physical debility She is gradually improving.  She will continue physical therapy as tolerated. She will get advanced home care service nursing staff to assess her clinical progress once a week  Combined systolic and diastolic heart failure, NYHA class 3 ([place]) She has no signs or symptoms of congestive heart failure.  She will continue medical management.  Cancer associated pain She has no further cancer associated pain since I saw her.  She will continue to take pain medicine as needed.  Other constipation She is taking laxatives on a regular basis with regular bowel movement. She has no clinical signs or symptoms of bowel obstruction.   No orders of the defined types were placed in this encounter.   INTERVAL HISTORY: Please see below for problem oriented charting. She returns with friend and caregiver for further follow-up She has numerous questions related to the risk and benefits of single agent chemo versus combination treatment, role of acupuncture, risk of shingles, risk of neuropathy with treatment and others Since last time I saw her, she is doing better.  She had regular bowel movement and minimum pain. She is able to get around mobilizing at home without difficulties. No recent chest pain, shortness of breath or exacerbation of congestive heart failure. The patient denies any recent signs or symptoms of bleeding such as spontaneous epistaxis, hematuria or hematochezia.   SUMMARY OF ONCOLOGIC HISTORY:   Granulosa cell tumor of ovary   02/19/1998 Initial Diagnosis    The granulosa cell tumor was initially diagnosed at [facility] in 2000, with recurrences in 2004, 2007 and spring 2011. The original operative note from 2000 and that pathology did not transfer into present EMR  and I do not know laterality of the original tumor. The surgery in  2004 included resection of the tumor with small bowel resection and colectomy for diverticulosis. She had laparoscopic repair of ventral hernia in 2005. In 2007 she had resection of pelvic recurrence of tumor plus open repair of ventral hernia with mesh. Most recent intervention was laparoscopic resection of suprapubic involvement by Dr.Hoxworth in May 2011; she had a small and asymptomatic suprapubic hernia in 2011 which has been followed. All of her disease has been very indolent and has generally been detected by CT scans. Last CT AP was April 2013; [name], self-limited episode of bowel obstruction in May 2012.    08/24/2002 Pathology Results    SOFT TISSUE MASS, [name], [name], there are tumor cells present which appear histologically similar to those noted in the patient's previous granulosa cell tumor ([id number]) and therefore, consistent with recurrent/metastatic granulosa cell tumor. The findings correlate with the cytology material    12/06/2002 Pathology Results    1. SMALL INTESTINE, [name], SIGMOID, SEGMENTAL RESECTION: DIVERTICULOSIS WITH FOCAL MILD CHRONIC DIVERTICULITIS. SIX BENIGN LYMPH NODES. NO TUMOR IDENTIFIED.    05/12/2005 PET scan    Again identified are enlarged lymph nodes within the right posterior cervical region and left external iliac region, [name], especially when compared with the examination dated 10/30/04.    10/01/2005 Pathology Results     1. ABDOMINAL WALL MASS: METASTATIC GRANULOSA CELL TUMOR.  2. LEFT ILIAC MASS: METASTATIC GRANULOSA CELL TUMOR.  COMMENT Both specimens are involved by metastatic tumor with features consistent with metastasis from the previously diagnosed adult granulosa cell tumor.     06/11/2007 Imaging    Stable CT the pelvis - no evidence of metastatic disease.    06/09/2008 Imaging    1.  Interval enlargement of small left perivesical nodule measuring 18 mm maximally.  This may reflect a lymph node but has a nonspecific appearance.  Attention to this area followup is recommended. 2.  Otherwise stable postsurgical appearance of the pelvis.    12/14/2008 Imaging    1.  Mild increase in size of low attenuation nodule in the left anterior pelvis indenting the urinary bladder, which now measures 2.8 cm compared with 1.8 cm on prior study.  Metastatic disease cannot be excluded. 2.  No other significant changes identified.  Stable diverticulosis and small suprapubic anterior abdominal wall hernia containing a loop of small bowel.    05/23/2009 Pathology Results    1. SOFT TISSUE MASS, SIMPLE EXCISION, : INVOLVEMENT BY NEOPLASM CONSISTENT WITH METASTATIC GRANULOSA CELL TUMOR.    06/08/2010 Imaging    1.  No acute findings. 2.  Interval progression of mild diffuse left renal atrophy and hypoperfusion, consistent with left renal artery stenosis. 3.  Stable small hiatal hernia, tiny periumbilical hernia, and small suprapubic hernia containing a loop of small bowel.  No evidence of bowel ischemia or obstruction. 4.  Diverticulosis.  No radiographic evidence of diverticulitis.    05/22/2011 Imaging  1.  Stable exam.  No new or progressive disease identified within the abdomen or pelvis. 2.  Stable small hiatal hernia and suprapubic ventral hernia. Stable left renal atrophy, likely secondary to renal artery stenosis.    11/28/2011 Imaging    1.  No findings to suggest recurrent or metastatic disease in  the abdomen or pelvis on today's examination. 2.  Indeterminate liver lesion in the right lobe of the liver is unchanged compared to numerous remote prior examinations. 3. Small suprapubic ventral hernia again noted. 4.  Extensive atherosclerosis including at least three vessel coronary artery disease.  5.  Postoperative changes, as above.     12/01/2016 Tumor Marker    Patient's tumor was tested for the following markers: Inhibin B Results of the tumor marker test revealed 119.2    12/11/2016 Imaging    Multiple new soft tissue masses in left pelvis and central small bowel mesentery, consistent with recurrent/metastatic carcinoma.     03/25/2017 Imaging    1. Omental and mesenteric tumor implants throughout the abdomen and pelvis with mixed findings when compared the prior study. Some of these lesions are clearly smaller and others are clearly larger. No new lesions are identified. 2. Stable severe atherosclerotic calcifications. 3. Stable low anterior abdominal wall hernia. 4. Stable low-attenuation lesion involving the right hepatic lobe with adjacent calcifications or surgical clips.    10/12/2017 Imaging    Extraperitoneal fluid/hemorrhage related to the patient's dominant left pelvic tumor, as described above.  Progression of the patient's metastatic ovarian cancer, as described above. Dominant cystic/solid mass in the left adnexa/lower pelvis has increased in size. Peritoneal disease/omental caking is mildly progressive. New pulmonary nodule at the right lung base, indeterminate.     10/12/2017 - 10/17/2017 Hospital Admission    She was admitted for retroperitoneal bleed.    10/16/2017 Imaging    1. Improving peritoneal hemorrhage. 2. No significant change in cystic and solid left pelvic mass and smaller anterior peritoneal cystic and solid mass compatible with tumor implant. 3. No evidence of metastatic disease elsewhere in the abdomen or pelvis. 4. Cardiomegaly. 5.  Calcific  coronary artery and aortic atherosclerosis.  Aortic Atherosclerosis (ICD10-I70.0).    10/27/2017 Tumor Marker    Patient's tumor was tested for the following markers: Inhibin B Results of the tumor marker test revealed 98.4     Ovarian cancer ([place])   10/19/2017 Initial Diagnosis    Ovarian cancer ([place])     REVIEW OF SYSTEMS:   Constitutional: Denies fevers, chills or abnormal weight loss Eyes: Denies blurriness of vision Ears, nose, mouth, throat, and face: Denies mucositis or sore throat Respiratory: Denies cough, dyspnea or wheezes Cardiovascular: Denies palpitation, chest discomfort or lower extremity swelling Gastrointestinal:  Denies nausea, heartburn or change in bowel habits Skin: Denies abnormal skin rashes Lymphatics: Denies new lymphadenopathy or easy bruising Neurological:Denies numbness, tingling or new weaknesses Behavioral/Psych: Mood is stable, no new changes  All other systems were reviewed with the patient and are negative.  I have reviewed the past medical history, past surgical history, social history and family history with the patient and they are unchanged from previous note.  ALLERGIES:  is allergic to bee venom; other; amiodarone; and prednisone.  MEDICATIONS:  Current Outpatient Medications  Medication Sig Dispense Refill  . atorvastatin (LIPITOR) 80 MG tablet TAKE 1 TABLET BY MOUTH EVERY MORNING (Patient taking differently: Take 80 mg by mouth every morning. ) 90 tablet 2  . beta carotene w/minerals (OCUVITE) tablet Take 1 tablet  by mouth 2 (two) times daily.     . Calcium Carb-Cholecalciferol (CALTRATE 600+D) 600-800 MG-UNIT TABS Take 1 tablet by mouth every evening.     . Coenzyme Q-10 100 MG capsule Take 200 mg by mouth daily.     Marland Kitchen diltiazem (CARDIZEM CD) 120 MG 24 hr capsule TAKE ONE CAPSULE BY MOUTH AT BEDTIME (Patient taking differently: Take 120 mg by mouth at bedtime. ) 90 capsule 3  . diltiazem (CARDIZEM CD) 240 MG 24 hr capsule TAKE ONE  CAPSULE BY MOUTH DAILY WITH BREAKFAST (Patient taking differently: Take 240 mg by mouth daily. ) 30 capsule 11  . furosemide (LASIX) 40 MG tablet Take 80 mg by mouth daily.    Marland Kitchen gabapentin (NEURONTIN) 300 MG capsule Take 1 capsule (300 mg total) by mouth 2 (two) times daily. 60 capsule 9  . HYDROmorphone (DILAUDID) 2 MG tablet Take 1 tablet (2 mg total) by mouth every 4 (four) hours as needed for severe pain. 30 tablet 0  . isosorbide mononitrate (IMDUR) 60 MG 24 hr tablet TAKE 1 TABLET BY MOUTH DAILY (Patient taking differently: Take 60 mg by mouth daily. ) 30 tablet 9  . KLOR-CON M20 20 MEQ tablet TAKE 1 TABLET BY MOUTH EVERY DAY (Patient taking differently: Take 20 mEq by mouth daily. ) 90 tablet 3  . lidocaine-prilocaine (EMLA) cream Apply 1 application topically as needed. 30 g 6  . metoprolol tartrate (LOPRESSOR) 100 MG tablet Take 1 tablet (100 mg total) by mouth every morning. Also takes (3) 25 mg tabs (75 mg) in the evening (Patient taking differently: Take 100 mg by mouth 2 (two) times daily. ) 90 tablet 3  . Multiple Vitamin (MULTIVITAMIN) capsule Take 1 capsule by mouth daily.    . nitroGLYCERIN (NITROSTAT) 0.4 MG SL tablet PLACE 1 TABLET (0.4 MG TOTAL) UNDER THE TONGUE EVERY 5 (FIVE) MINUTES AS NEEDED. FOR CHEST PAIN (Patient taking differently: Place 0.4 mg under the tongue every 5 (five) minutes as needed for chest pain. ) 25 tablet 6  . Omega-3 Fatty Acids (FISH OIL PO) Take 1,200 mg by mouth daily.     . ondansetron (ZOFRAN) 8 MG tablet Take 1 tablet (8 mg total) by mouth every 8 (eight) hours as needed for nausea. 30 tablet 3  . prochlorperazine (COMPAZINE) 10 MG tablet Take 1 tablet (10 mg total) by mouth every 6 (six) hours as needed for nausea or vomiting. 30 tablet 0  . spironolactone (ALDACTONE) 25 MG tablet Take 25 mg by mouth as needed (swelling).     No current facility-administered medications for this visit.     PHYSICAL EXAMINATION: ECOG PERFORMANCE STATUS: 2 -  Symptomatic, <50% confined to bed  Vitals:   10/27/17 0839  BP: (!) 156/69  Pulse: 84  Resp: 18  Temp: 97.8 F (36.6 C)  SpO2: 100%   Filed Weights   10/27/17 0839  Weight: 177 lb 9.6 oz (80.6 kg)    GENERAL:alert, no distress and comfortable SKIN: skin color, texture, turgor are normal, no rashes or significant lesions EYES: normal, Conjunctiva are pink and non-injected, sclera clear OROPHARYNX:no exudate, no erythema and lips, buccal mucosa, and tongue normal  NECK: supple, thyroid normal size, non-tender, without nodularity LYMPH:  no palpable lymphadenopathy in the cervical, axillary or inguinal LUNGS: clear to auscultation and percussion with normal breathing effort HEART: regular rate & rhythm and no murmurs and no lower extremity edema ABDOMEN:abdomen soft, non-tender and normal bowel sounds Musculoskeletal:no cyanosis of digits and no clubbing  NEURO: alert & oriented x 3 with fluent speech, no focal motor/sensory deficits  LABORATORY DATA:  I have reviewed the data as listed    Component Value Date/Time   NA 143 10/27/2017 0821   NA 142 12/14/2014 1324   K 3.6 10/27/2017 0821   K 4.2 12/14/2014 1324   CL 103 10/27/2017 0821   CL 105 05/26/2012 1111   CO2 31 10/27/2017 0821   CO2 30 (H) 12/14/2014 1324   GLUCOSE 109 (H) 10/27/2017 0821   GLUCOSE 130 12/14/2014 1324   GLUCOSE 89 05/26/2012 1111   BUN 16 10/27/2017 0821   BUN 19.6 12/01/2016 1116   CREATININE 0.87 10/27/2017 0821   CREATININE 1.03 03/12/2017 1329   CREATININE 0.9 12/01/2016 1116   CALCIUM 9.2 10/27/2017 0821   CALCIUM 9.1 12/14/2014 1324   PROT 6.7 10/27/2017 0821   PROT 6.5 10/08/2017 0905   PROT 6.5 12/14/2014 1324   ALBUMIN 2.9 (L) 10/27/2017 0821   ALBUMIN 3.9 10/08/2017 0905   ALBUMIN 3.4 (L) 12/14/2014 1324   AST 30 10/27/2017 0821   AST 29 12/14/2014 1324   ALT 29 10/27/2017 0821   ALT 20 12/14/2014 1324   ALKPHOS 101 10/27/2017 0821   ALKPHOS 103 12/14/2014 1324   BILITOT  0.8 10/27/2017 0821   BILITOT 0.6 10/08/2017 0905   BILITOT 0.77 12/14/2014 1324   GFRNONAA 59 (L) 10/27/2017 0821   GFRNONAA 49 (L) 03/12/2017 1329   GFRAA >60 10/27/2017 0821   GFRAA 56 (L) 03/12/2017 1329    No results found for: SPEP, UPEP  Lab Results  Component Value Date   WBC 7.3 10/27/2017   NEUTROABS 5.5 10/27/2017   HGB 12.0 10/27/2017   HCT 36.4 10/27/2017   MCV 92.5 10/27/2017   PLT 355 10/27/2017      Chemistry      Component Value Date/Time   NA 143 10/27/2017 0821   NA 142 12/14/2014 1324   K 3.6 10/27/2017 0821   K 4.2 12/14/2014 1324   CL 103 10/27/2017 0821   CL 105 05/26/2012 1111   CO2 31 10/27/2017 0821   CO2 30 (H) 12/14/2014 1324   BUN 16 10/27/2017 0821   BUN 19.6 12/01/2016 1116   CREATININE 0.87 10/27/2017 0821   CREATININE 1.03 03/12/2017 1329   CREATININE 0.9 12/01/2016 1116      Component Value Date/Time   CALCIUM 9.2 10/27/2017 0821   CALCIUM 9.1 12/14/2014 1324   ALKPHOS 101 10/27/2017 0821   ALKPHOS 103 12/14/2014 1324   AST 30 10/27/2017 0821   AST 29 12/14/2014 1324   ALT 29 10/27/2017 0821   ALT 20 12/14/2014 1324   BILITOT 0.8 10/27/2017 0821   BILITOT 0.6 10/08/2017 0905   BILITOT 0.77 12/14/2014 1324       RADIOGRAPHIC STUDIES: I have personally reviewed the radiological images as listed and agreed with the findings in the report. Dg Chest 1 View  Result Date: 10/15/2017 CLINICAL DATA:  Dyspnea. EXAM: CHEST  1 VIEW COMPARISON:  Radiograph of October 13, 2017. FINDINGS: Stable mild cardiomegaly is noted. Left-sided pacemaker is unchanged in position. Atherosclerosis of thoracic aorta is noted. No pneumothorax is noted. Mild bibasilar subsegmental atelectasis is noted with small associated pleural effusions. Bony thorax is unremarkable. IMPRESSION: Mild bibasilar subsegmental atelectasis with small associated pleural effusions. Aortic Atherosclerosis (ICD10-I70.0). Electronically Signed   By: Marijo Conception, M.D.   On:  10/15/2017 14:29   Ct Abdomen Pelvis W Contrast  Result Date: 10/16/2017 CLINICAL  DATA:  Followup left pelvic mass and associated hemorrhage. Metastatic ovarian cancer. EXAM: CT ABDOMEN AND PELVIS WITH CONTRAST TECHNIQUE: Multidetector CT imaging of the abdomen and pelvis was performed using the standard protocol following bolus administration of intravenous contrast. CONTRAST:  12mL ISOVUE-300 IOPAMIDOL (ISOVUE-300) INJECTION 61% COMPARISON:  10/12/2017. FINDINGS: Lower chest: Enlarged heart. Intracardiac pacemaker leads. Atheromatous calcifications, including the coronary arteries and aorta. Hepatobiliary: Stable 1.9 cm oval area of low density with coarse calcifications in the inferior right lobe of the liver, suggesting previous wedge resection. Cholecystectomy clips. Pancreas: Unremarkable. No pancreatic ductal dilatation or surrounding inflammatory changes. Spleen: Normal in size without focal abnormality. Adrenals/Urinary Tract: The left kidney remains small compared to the right kidney. Cortical scarring in the right kidney is unchanged. Normal appearing adrenal glands and ureters. Anterior aspect of the urinary bladder is extending into the proximal portion of a midline ventral hernia at the inferior pelvis. Stomach/Bowel: Unremarkable stomach, small bowel and colon. No evidence of appendicitis. Vascular/Lymphatic: Atheromatous arterial calcifications without aneurysm. No enlarged lymph nodes. Reproductive: Status post hysterectomy. Other: The previously demonstrated 9.4 x 9.2 cm left anterior pelvic hemorrhage currently measures 9.5 x 7.3 cm and is less dense. More anteriorly located hemorrhage on the left previously measured 15.6 x 4.4 cm and currently measures 17.3 x 2.8 cm on image number 64 series 3. A previously demonstrated 2.8 cm cystic and solid midline anterior peritoneal mass at the level of the upper pelvis measures 3.0 cm in corresponding diameter today. The previously demonstrated 8.2 x  5.6 cm cystic and solid left pelvic mass currently measures 7.8 x 5.8 cm on image number 70 series 3. There is less free peritoneal blood in the upper abdomen. Anterior hernia repair mesh is again demonstrated. A ventral hernia at the level of the inferior pelvis containing herniated small bowel without obstruction is again demonstrated. This also contains a small amount the urinary bladder, as described above. Musculoskeletal: Lumbar and lower thoracic spine degenerative changes. IMPRESSION: 1. Improving peritoneal hemorrhage. 2. No significant change in cystic and solid left pelvic mass and smaller anterior peritoneal cystic and solid mass compatible with tumor implant. 3. No evidence of metastatic disease elsewhere in the abdomen or pelvis. 4. Cardiomegaly. 5.  Calcific coronary artery and aortic atherosclerosis. Aortic Atherosclerosis (ICD10-I70.0). Electronically Signed   By: Claudie Revering M.D.   On: 10/16/2017 12:32   Ct Abdomen Pelvis W Contrast  Result Date: 10/12/2017 CLINICAL DATA:  Left lower quadrant pain, nausea. History of metastatic ovarian cancer. EXAM: CT ABDOMEN AND PELVIS WITH CONTRAST TECHNIQUE: Multidetector CT imaging of the abdomen and pelvis was performed using the standard protocol following bolus administration of intravenous contrast. CONTRAST:  53mL ISOVUE-300 IOPAMIDOL (ISOVUE-300) INJECTION 61% COMPARISON:  03/24/2017 FINDINGS: Motion degraded images. Lower chest: 11 mm nodule at the right lung base (series 7/image 3), new. Mild right lower lobe atelectasis. Cardiomegaly. Three vessel coronary atherosclerosis. Pacemaker leads, incompletely visualized. Hepatobiliary: 1.9 cm low-density lesion in segment 6 of the liver (series 4/image 30), with associated postsurgical change. Status post cholecystectomy. No intrahepatic or extrahepatic ductal dilatation. Pancreas: Within normal limits. Spleen: Within normal limits. Adrenals/Urinary Tract: Adrenal glands are within normal limits. Left  renal cortical scarring/atrophy. Right lower pole cortical scarring (series 4/image 41). No hydronephrosis. Left bladder implants, including a dominant 2.1 cm lesion (series 4/image 35), previously 1.9 cm. Additional lesion along the left pubic symphysis is poorly visualized on the current study (series 4/image 77). Stomach/Bowel: Stomach is notable for a tiny hiatal hernia. No  evidence of bowel obstruction. Normal appendix (series 4/image 47). Vascular/Lymphatic: No evidence of abdominal aortic aneurysm. Atherosclerotic calcifications of the abdominal aorta and branch vessels. No suspicious abdominopelvic lymphadenopathy. Reproductive: Status post hysterectomy. 5.6 x 8.2 cm predominantly cystic mass in the left adnexa/lower pelvis (series 4/image 69), previously 4.7 x 4.5 cm. Right medial aspect of the lesion has hemorrhaged into the extraperitoneum, measuring approximately 9.2 x 9.4 cm superior to the mass (series 4/image 60) with additional fluid/hemorrhage in the anterior abdominal mesentery measuring approximately 4.4 x 15.6 cm (series 4/image 65). Other: Additional peritoneal disease/omental caking beneath the anterior abdominal wall, including a 2.8 cm cystic/solid implant beneath midline anterior abdominal wall mesh (series 4/image 63), previously 2.1 cm. Additional fluid/hemorrhage along the liver, spleen, and right pericolic gutter. Musculoskeletal: Degenerative changes of the visualized thoracolumbar spine. IMPRESSION: Extraperitoneal fluid/hemorrhage related to the patient's dominant left pelvic tumor, as described above. Progression of the patient's metastatic ovarian cancer, as described above. Dominant cystic/solid mass in the left adnexa/lower pelvis has increased in size. Peritoneal disease/omental caking is mildly progressive. New pulmonary nodule at the right lung base, indeterminate. These results were called by telephone at the time of interpretation on 10/12/2017 at 9:36 am to Dr. Aletta Edouard  , who verbally acknowledged these results. Electronically Signed   By: Julian Hy M.D.   On: 10/12/2017 09:41   Dg Chest Port 1 View  Result Date: 10/13/2017 CLINICAL DATA:  Shortness of Breath EXAM: PORTABLE CHEST 1 VIEW COMPARISON:  06/29/2017 FINDINGS: Left pacer remains in place, unchanged. Mild cardiomegaly with vascular congestion. Bilateral lower lobe airspace opacities. Small bilateral effusions. Worsening aeration in the bases since prior study. IMPRESSION: Small bilateral effusions with bibasilar atelectasis or infiltrate. Cardiomegaly with vascular congestion. Worsening aeration in the lung bases. Electronically Signed   By: Rolm Baptise M.D.   On: 10/13/2017 09:45    All questions were answered. The patient knows to call the clinic with any problems, questions or concerns. No barriers to learning was detected.  I spent 40 minutes counseling the patient face to face. The total time spent in the appointment was 55 minutes and more than 50% was on counseling and review of test results  Heath Lark, MD 10/28/2017 7:23 AM

## 2017-10-28 NOTE — Assessment & Plan Note (Signed)
She has no further cancer associated pain since I saw her.  She will continue to take pain medicine as needed.

## 2017-10-28 NOTE — Assessment & Plan Note (Signed)
She has moderate protein calorie malnutrition due to recent hospitalization and bleeding She is recovering well.  She will continue diet supplement as tolerated.

## 2017-10-28 NOTE — Assessment & Plan Note (Signed)
She has no signs or symptoms of congestive heart failure She will continue medical management 

## 2017-10-28 NOTE — Discharge Instructions (Addendum)
Implanted Port Insertion, Care After °This sheet gives you information about how to care for yourself after your procedure. Your health care provider may also give you more specific instructions. If you have problems or questions, contact your health care provider. °What can I expect after the procedure? °After your procedure, it is common to have: °· Discomfort at the port insertion site. °· Bruising on the skin over the port. This should improve over 3-4 days. ° °Follow these instructions at home: °Port care °· After your port is placed, you will get a manufacturer's information card. The card has information about your port. Keep this card with you at all times. °· Take care of the port as told by your health care provider. Ask your health care provider if you or a family member can get training for taking care of the port at home. A home health care nurse may also take care of the port. °· Make sure to remember what type of port you have. °Incision care °· Follow instructions from your health care provider about how to take care of your port insertion site. Make sure you: °? Wash your hands with soap and water before you change your bandage (dressing). If soap and water are not available, use hand sanitizer. °? Change your dressing as told by your health care provider. °? Leave stitches (sutures), skin glue, or adhesive strips in place. These skin closures may need to stay in place for 2 weeks or longer. If adhesive strip edges start to loosen and curl up, you may trim the loose edges. Do not remove adhesive strips completely unless your health care provider tells you to do that. °· Check your port insertion site every day for signs of infection. Check for: °? More redness, swelling, or pain. °? More fluid or blood. °? Warmth. °? Pus or a bad smell. °General instructions °· Do not take baths, swim, or use a hot tub until your health care provider approves. °· Do not lift anything that is heavier than 10 lb (4.5  kg) for a week, or as told by your health care provider. °· Ask your health care provider when it is okay to: °? Return to work or school. °? Resume usual physical activities or sports. °· Do not drive for 24 hours if you were given a medicine to help you relax (sedative). °· Take over-the-counter and prescription medicines only as told by your health care provider. °· Wear a medical alert bracelet in case of an emergency. This will tell any health care providers that you have a port. °· Keep all follow-up visits as told by your health care provider. This is important. °Contact a health care provider if: °· You cannot flush your port with saline as directed, or you cannot draw blood from the port. °· You have a fever or chills. °· You have more redness, swelling, or pain around your port insertion site. °· You have more fluid or blood coming from your port insertion site. °· Your port insertion site feels warm to the touch. °· You have pus or a bad smell coming from the port insertion site. °Get help right away if: °· You have chest pain or shortness of breath. °· You have bleeding from your port that you cannot control. °Summary °· Take care of the port as told by your health care provider. °· Change your dressing as told by your health care provider. °· Keep all follow-up visits as told by your health care provider. °  This information is not intended to replace advice given to you by your health care provider. Make sure you discuss any questions you have with your health care provider. °Document Released: 11/10/2012 Document Revised: 12/12/2015 Document Reviewed: 12/12/2015 °[employer] Interactive Patient Education © 2017 [employer] °Moderate Conscious Sedation, Adult, Care After °These instructions provide you with information about caring for yourself after your procedure. Your health care provider may also give you more specific instructions. Your treatment has been planned according to current medical  practices, but problems sometimes occur. Call your health care provider if you have any problems or questions after your procedure. °What can I expect after the procedure? °After your procedure, it is common: °· To feel sleepy for several hours. °· To feel clumsy and have poor balance for several hours. °· To have poor judgment for several hours. °· To vomit if you eat too soon. ° °Follow these instructions at home: °For at least 24 hours after the procedure: ° °· Do not: °? Participate in activities where you could fall or become injured. °? Drive. °? Use heavy machinery. °? Drink alcohol. °? Take sleeping pills or medicines that cause drowsiness. °? Make important decisions or sign legal documents. °? Take care of children on your own. °· Rest. °Eating and drinking °· Follow the diet recommended by your health care provider. °· If you vomit: °? Drink water, juice, or soup when you can drink without vomiting. °? Make sure you have little or no nausea before eating solid foods. °General instructions °· Have a responsible adult stay with you until you are awake and alert. °· Take over-the-counter and prescription medicines only as told by your health care provider. °· If you smoke, do not smoke without supervision. °· Keep all follow-up visits as told by your health care provider. This is important. °Contact a health care provider if: °· You keep feeling nauseous or you keep vomiting. °· You feel light-headed. °· You develop a rash. °· You have a fever. °Get help right away if: °· You have trouble breathing. °This information is not intended to replace advice given to you by your health care provider. Make sure you discuss any questions you have with your health care provider. °Document Released: 11/10/2012 Document Revised: 06/25/2015 Document Reviewed: 05/12/2015 °[employer] Interactive Patient Education © 2018 [employer] ° °

## 2017-10-28 NOTE — Assessment & Plan Note (Signed)
I reviewed the guidelines and discussed with the patient extensively about the risk, benefits, side effects of carboplatin only versus carboplatin with Taxol.  Ultimately, the patient would like to proceed with single agent carboplatin only. We discussed the role of chemotherapy. The intent is of palliative intent.  We discussed some of the risks, benefits, side-effects of carboplatin  Some of the short term side-effects included, though not limited to, including weight loss, life threatening infections, risk of allergic reactions, need for transfusions of blood products, nausea, vomiting, change in bowel habits, loss of hair, admission to hospital for various reasons, and risks of death.   Long term side-effects are also discussed including risks of infertility, permanent damage to nerve function, hearing loss, chronic fatigue, kidney damage with possibility needing hemodialysis, and rare secondary malignancy including bone marrow disorders.  The patient is aware that the response rates discussed earlier is not guaranteed.  After a long discussion, patient made an informed decision to proceed with the prescribed plan of care.   Patient education material was dispensed. I recommend AUC of 5 given her age and other comorbidities. The patient has sensitivity to steroids.  I plan to reduce premedication IV dexamethasone. She will get port placement before proceed with chemotherapy.

## 2017-10-28 NOTE — H&P (Signed)
Chief Complaint: Patient was seen in consultation today for ovarian cancer  Referring Physician(s): [place]  Supervising Physician: Corrie Mckusick  Patient Status: [facility] - Out-pt  History of Present Illness: Patty [name] is a 84 y.o. female with past medical history of CKD, CAD s/p multiple stents and pacemaker x2, MI, OSA, a fib previously on Coumadin who presents to radiology for Port-A-Cath placement due to recent diagnosis of ovarian cancer.   Patients presents today in her usual state of health. Denies fever, chills, cough, shortness of breath, abdominal pain, nausea, vomiting.  She has been NPO.  She has not been on Coumadin since her hospitalization in early September due to bleeding.   Past Medical History:  Diagnosis Date  . Chronic anticoagulation - coumadin, CHADS2VASC=6 05/17/2015  . CKD (chronic kidney disease), stage III ([place])   . Combined systolic and diastolic heart failure ([place])   . Coronary artery disease    a. s/p multiple stents - stenting x 2 to the LAD and x 1 to the LCx in 2000, rotational arthrectomy to proximal LCx and DES to LAD in 2014 and DES to LCx in 2014, along with DES to LAD for re-in-stent stenosis in 2015, and DES to ostial LAD in 2016. b. 07/2016 - orbital atherectomy & DES to mid LCx.  . Diverticulitis   . Granulosa cell carcinoma ([place])    abd; last episode was in 2009  . Hyperlipidemia   . Hypertension   . LBBB (left bundle branch block)       . Myocardial infarction ([place]) 2002  . Obesity   . OSA on CPAP   . Pacemaker failure    a. Prior leadless PPM with premature battery failure, being managed conservatively without replacement.  . Peripheral vascular disease ([place])   . Permanent atrial fibrillation ([place]) 2013  . Renal artery stenosis ([place])   . Tachycardia-bradycardia syndrome ([place])    a. s/p leadless pacemaker ([name]) implanted by Dr [name]  . Varicose veins     Past Surgical History:  Procedure Laterality Date  .  ABDOMINAL HYSTERECTOMY    . CARDIAC CATHETERIZATION  09/03/2007   EF 70%; Failed attempt at PCI to OM  . CARDIAC CATHETERIZATION  11/01/2003   EF 70%  . CARDIAC CATHETERIZATION N/A 12/14/2015   Procedure: Left Heart Cath and Coronary Angiography;  Surgeon: Burnell Blanks, MD;  Location: [place] [facility];  Service: Cardiovascular;  Laterality: N/A;  . CARDIOVERSION  12/31/2011   Procedure: CARDIOVERSION;  Surgeon: [name], MD;  Location: [facility] ENDOSCOPY;  Service: Cardiovascular;  Laterality: N/A;  . CARDIOVERSION N/A 12/31/2011   Procedure: CARDIOVERSION;  Surgeon: [name], MD;  Location: [facility] CATH LAB;  Service: Cardiovascular;  Laterality: N/A;  . CATARACT EXTRACTION, BILATERAL  2015  . CHOLECYSTECTOMY  1980's  . COLON SURGERY  2004   colectomy for diverticulosis  . CORONARY ANGIOPLASTY WITH STENT PLACEMENT  2000; 08/11/2012; 11/12/2012   3 + 2 LAD & CFX; 2nd CFX stent 11/12/2012  . CORONARY ATHERECTOMY N/A 07/15/2016   Procedure: Coronary Atherectomy;  Surgeon: Martinique, Peter M, MD;  Location: [place] [facility] LAB;  Service: Cardiovascular;  Laterality: N/A;  . CORONARY BALLOON ANGIOPLASTY N/A 07/14/2016   Procedure: Coronary Balloon Angioplasty;  Surgeon: Martinique, Peter M, MD;  Location: [place] [facility];  Service: Cardiovascular;  Laterality: N/A;  . CORONARY STENT INTERVENTION N/A 07/15/2016   Procedure: Coronary Stent Intervention;  Surgeon: Martinique, Peter M, MD;  Location: [place] [facility] LAB;  Service: Cardiovascular;  Laterality: N/A;  . FRACTIONAL FLOW RESERVE WIRE  10/07/2013   Procedure: [place];  Surgeon: [name], MD;  Location: [facility] CATH LAB;  Service: Cardiovascular;;  . granulosa tumor excision  2000; 2003; 2004; 2007   "all in my abdomen including small intestines, outside my ?uterus/etc" (08/11/2012)  . HERNIA REPAIR  2005   "laparoscopic"  . LEFT HEART CATH AND CORONARY ANGIOGRAPHY N/A 07/14/2016   Procedure: Left Heart  Cath and Coronary Angiography;  Surgeon: Martinique, Peter M, MD;  Location: [place] [facility] LAB;  Service: Cardiovascular;  Laterality: N/A;  . LEFT HEART CATHETERIZATION WITH CORONARY ANGIOGRAM N/A 11/12/2012   Procedure: LEFT HEART CATHETERIZATION WITH CORONARY ANGIOGRAM;  Surgeon: [name], MD;  Location: [facility] CATH LAB;  Service: Cardiovascular;  Laterality: N/A;  . LEFT HEART CATHETERIZATION WITH CORONARY ANGIOGRAM N/A 10/07/2013   Procedure: LEFT HEART CATHETERIZATION WITH CORONARY ANGIOGRAM;  Surgeon: [name], MD;  Location: [facility] CATH LAB;  Service: Cardiovascular;  Laterality: N/A;  . LEFT HEART CATHETERIZATION WITH CORONARY ANGIOGRAM N/A 12/14/2013   Procedure: LEFT HEART CATHETERIZATION WITH CORONARY ANGIOGRAM;  Surgeon: Sinclair Grooms, MD;  Location: [facility] CATH LAB;  Service: Cardiovascular;  Laterality: N/A;  . LEFT HEART CATHETERIZATION WITH CORONARY ANGIOGRAM N/A 05/16/2014   Procedure: LEFT HEART CATHETERIZATION WITH CORONARY ANGIOGRAM;  Surgeon: Sherren Mocha, MD;  Location: [facility] CATH LAB;  Service: Cardiovascular;  Laterality: N/A;  . PERCUTANEOUS CORONARY INTERVENTION-BALLOON ONLY  08/04/2012   Procedure: PERCUTANEOUS CORONARY INTERVENTION-BALLOON ONLY;  Surgeon: [name], MD;  Location: [facility] CATH LAB;  Service: Cardiovascular;;  . PERCUTANEOUS CORONARY ROTOBLATOR INTERVENTION (PCI-R) N/A 08/11/2012   Procedure: PERCUTANEOUS CORONARY ROTOBLATOR INTERVENTION (PCI-R);  Surgeon: [name], MD;  Location: [facility] CATH LAB;  Service: Cardiovascular;  Laterality: N/A;  . PERCUTANEOUS CORONARY STENT INTERVENTION (PCI-S)  10/07/2013   Procedure: PERCUTANEOUS CORONARY STENT INTERVENTION (PCI-S);  Surgeon: [name], MD;  Location: [facility] CATH LAB;  Service: Cardiovascular;;  . PERMANENT PACEMAKER INSERTION N/A 03/16/2012   [name] (SJM) leadless pacemaker (LEADLESS II STUDY PATEINT)  . SALIVARY GLAND SURGERY  2000's   "had a little lump removed; granulosa related;  it was benign" (08/11/2012)  . TEE WITHOUT CARDIOVERSION  12/31/2011   Procedure: TRANSESOPHAGEAL ECHOCARDIOGRAM (TEE);  Surgeon: [name], MD;  Location: [place];  Service: Cardiovascular;  Laterality: N/A;  . VARICOSE VEIN SURGERY Bilateral 1977    Allergies: Bee venom; Other; Amiodarone; and Prednisone  Medications: Prior to Admission medications   Medication Sig Start Date End Date Taking? Authorizing Provider  atorvastatin (LIPITOR) 80 MG tablet TAKE 1 TABLET BY MOUTH EVERY MORNING Patient taking differently: Take 80 mg by mouth every morning.  05/04/17  Yes [name], MD  beta carotene w/minerals (OCUVITE) tablet Take 1 tablet by mouth 2 (two) times daily.    Yes [provider]  Calcium Carb-Cholecalciferol (CALTRATE 600+D) 600-800 MG-UNIT TABS Take 1 tablet by mouth every evening.    Yes [provider]  Coenzyme Q-10 100 MG capsule Take 200 mg by mouth daily.    Yes [provider]  diltiazem (CARDIZEM CD) 120 MG 24 hr capsule TAKE ONE CAPSULE BY MOUTH AT BEDTIME Patient taking differently: Take 120 mg by mouth at bedtime.  08/17/17  Yes Dunn, Dayna N, PA-C  diltiazem (CARDIZEM CD) 240 MG 24 hr capsule TAKE ONE CAPSULE BY MOUTH DAILY WITH BREAKFAST Patient taking differently: Take 240 mg by mouth daily.  09/03/17  Yes [name], MD  furosemide (LASIX) 40 MG tablet Take 80 mg by mouth daily.   Yes [provider]  isosorbide mononitrate (IMDUR) 60 MG 24 hr tablet TAKE 1 TABLET BY MOUTH DAILY Patient taking differently: Take 60 mg by mouth daily.  04/17/17  Yes Seiler, Amber K, NP  KLOR-CON M20 20 MEQ tablet TAKE 1 TABLET BY MOUTH EVERY DAY Patient taking differently: Take 20 mEq by mouth daily.  08/05/17  Yes [name], MD  metoprolol tartrate (LOPRESSOR) 100 MG tablet Take 1 tablet (100 mg total) by mouth every morning. Also takes (3) 25 mg tabs (75 mg) in the evening Patient taking differently: Take 100  mg by mouth 2 (two) times daily.  03/31/17  Yes Allred, Jeneen Rinks, MD  Multiple Vitamin (MULTIVITAMIN) capsule Take 1 capsule by mouth daily.   Yes [provider]  Omega-3 Fatty Acids (FISH OIL PO) Take 1,200 mg by mouth daily.    Yes [provider]  gabapentin (NEURONTIN) 300 MG capsule Take 1 capsule (300 mg total) by mouth 2 (two) times daily. 10/27/17   Heath Lark, MD  HYDROmorphone (DILAUDID) 2 MG tablet Take 1 tablet (2 mg total) by mouth every 4 (four) hours as needed for severe pain. 10/19/17   Heath Lark, MD  lidocaine-prilocaine (EMLA) cream Apply 1 application topically as needed. 10/27/17   Heath Lark, MD  nitroGLYCERIN (NITROSTAT) 0.4 MG SL tablet PLACE 1 TABLET (0.4 MG TOTAL) UNDER THE TONGUE EVERY 5 (FIVE) MINUTES AS NEEDED. FOR CHEST PAIN Patient taking differently: Place 0.4 mg under the tongue every 5 (five) minutes as needed for chest pain.  08/31/17   Allred, Jeneen Rinks, MD  ondansetron (ZOFRAN) 8 MG tablet Take 1 tablet (8 mg total) by mouth every 8 (eight) hours as needed for nausea. 10/19/17   Heath Lark, MD  prochlorperazine (COMPAZINE) 10 MG tablet Take 1 tablet (10 mg total) by mouth every 6 (six) hours as needed for nausea or vomiting. 10/19/17   Heath Lark, MD  spironolactone (ALDACTONE) 25 MG tablet Take 25 mg by mouth as needed (swelling).    [provider]     Family History  Problem Relation Age of Onset  . Stroke Mother   . Heart attack Father   . Diabetes Father   . Hypertension Father   . Heart attack Brother   . Diabetes Brother   . Hypertension Brother   . Kidney failure Brother     Social History   Socioeconomic History  . Marital status: Widowed    Spouse name: Not on file  . Number of children: 4  . Years of education: Not on file  . Highest education level: Not on file  Occupational History  . Occupation: Retired Nurse, learning disability estate  Social Needs  . Financial resource strain: Not on file  . Food insecurity:     Worry: Not on file    Inability: Not on file  . Transportation needs:    Medical: Not on file    Non-medical: Not on file  Tobacco Use  . Smoking status: Former Smoker    Packs/day: 1.00    Years: 32.00    Pack years: 32.00    Types: Cigarettes    Last attempt to quit: 02/03/1974    Years since quitting: 43.7  . Smokeless tobacco: Never Used  Substance and Sexual Activity  . Alcohol use: Yes    Alcohol/week: 5.0 standard drinks    Types: 5 Glasses of  wine per week    Comment: 2017 WINE WITH DINNER   . Drug use: No  . Sexual activity: Never  Lifestyle  . Physical activity:    Days per week: Not on file    Minutes per session: Not on file  . Stress: Not on file  Relationships  . Social connections:    Talks on phone: Not on file    Gets together: Not on file    Attends religious service: Not on file    Active member of club or organization: Not on file    Attends meetings of clubs or organizations: Not on file    Relationship status: Not on file  Other Topics Concern  . Not on file  Social History Narrative   Lives with family.     Review of Systems: A 12 point ROS discussed and pertinent positives are indicated in the HPI above.  All other systems are negative.  Review of Systems  Constitutional: Negative for fatigue and fever.  Respiratory: Negative for cough and shortness of breath.   Cardiovascular: Negative for chest pain.  Gastrointestinal: Negative for abdominal pain.  Musculoskeletal: Negative for back pain.  Psychiatric/Behavioral: Negative for behavioral problems and confusion.    Vital Signs: BP 137/71   Pulse 85   Temp 97.7 F (36.5 C)   Resp 20   Ht 5' (1.524 m)   Wt 176 lb (79.8 kg)   SpO2 94%   BMI 34.37 kg/m   Physical Exam  Constitutional: She appears well-developed. No distress.  Neck: Normal range of motion. Neck supple. No tracheal deviation present.  Cardiovascular: Normal rate and regular rhythm.  Murmur heard. Pulmonary/Chest:  Effort normal and breath sounds normal. No respiratory distress.  Abdominal: Soft. She exhibits no distension.  Lymphadenopathy:    She has no cervical adenopathy.  Skin: Skin is warm and dry. She is not diaphoretic.  Psychiatric: She has a normal mood and affect. Her behavior is normal. Judgment and thought content normal.  Nursing note and vitals reviewed.    MD Evaluation Airway: WNL Heart: WNL Abdomen: WNL Chest/ Lungs: WNL ASA  Classification: 3 Mallampati/Airway Score: Two   Imaging: Dg Chest 1 View  Result Date: 10/15/2017 CLINICAL DATA:  Dyspnea. EXAM: CHEST  1 VIEW COMPARISON:  Radiograph of October 13, 2017. FINDINGS: Stable mild cardiomegaly is noted. Left-sided pacemaker is unchanged in position. Atherosclerosis of thoracic aorta is noted. No pneumothorax is noted. Mild bibasilar subsegmental atelectasis is noted with small associated pleural effusions. Bony thorax is unremarkable. IMPRESSION: Mild bibasilar subsegmental atelectasis with small associated pleural effusions. Aortic Atherosclerosis (ICD10-I70.0). Electronically Signed   By: Marijo Conception, M.D.   On: 10/15/2017 14:29   Ct Abdomen Pelvis W Contrast  Result Date: 10/16/2017 CLINICAL DATA:  Followup left pelvic mass and associated hemorrhage. Metastatic ovarian cancer. EXAM: CT ABDOMEN AND PELVIS WITH CONTRAST TECHNIQUE: Multidetector CT imaging of the abdomen and pelvis was performed using the standard protocol following bolus administration of intravenous contrast. CONTRAST:  147mL ISOVUE-300 IOPAMIDOL (ISOVUE-300) INJECTION 61% COMPARISON:  10/12/2017. FINDINGS: Lower chest: Enlarged heart. Intracardiac pacemaker leads. Atheromatous calcifications, including the coronary arteries and aorta. Hepatobiliary: Stable 1.9 cm oval area of low density with coarse calcifications in the inferior right lobe of the liver, suggesting previous wedge resection. Cholecystectomy clips. Pancreas: Unremarkable. No pancreatic  ductal dilatation or surrounding inflammatory changes. Spleen: Normal in size without focal abnormality. Adrenals/Urinary Tract: The left kidney remains small compared to the right kidney. Cortical scarring in the  right kidney is unchanged. Normal appearing adrenal glands and ureters. Anterior aspect of the urinary bladder is extending into the proximal portion of a midline ventral hernia at the inferior pelvis. Stomach/Bowel: Unremarkable stomach, small bowel and colon. No evidence of appendicitis. Vascular/Lymphatic: Atheromatous arterial calcifications without aneurysm. No enlarged lymph nodes. Reproductive: Status post hysterectomy. Other: The previously demonstrated 9.4 x 9.2 cm left anterior pelvic hemorrhage currently measures 9.5 x 7.3 cm and is less dense. More anteriorly located hemorrhage on the left previously measured 15.6 x 4.4 cm and currently measures 17.3 x 2.8 cm on image number 64 series 3. A previously demonstrated 2.8 cm cystic and solid midline anterior peritoneal mass at the level of the upper pelvis measures 3.0 cm in corresponding diameter today. The previously demonstrated 8.2 x 5.6 cm cystic and solid left pelvic mass currently measures 7.8 x 5.8 cm on image number 70 series 3. There is less free peritoneal blood in the upper abdomen. Anterior hernia repair mesh is again demonstrated. A ventral hernia at the level of the inferior pelvis containing herniated small bowel without obstruction is again demonstrated. This also contains a small amount the urinary bladder, as described above. Musculoskeletal: Lumbar and lower thoracic spine degenerative changes. IMPRESSION: 1. Improving peritoneal hemorrhage. 2. No significant change in cystic and solid left pelvic mass and smaller anterior peritoneal cystic and solid mass compatible with tumor implant. 3. No evidence of metastatic disease elsewhere in the abdomen or pelvis. 4. Cardiomegaly. 5.  Calcific coronary artery and aortic atherosclerosis.  Aortic Atherosclerosis (ICD10-I70.0). Electronically Signed   By: Claudie Revering M.D.   On: 10/16/2017 12:32   Ct Abdomen Pelvis W Contrast  Result Date: 10/12/2017 CLINICAL DATA:  Left lower quadrant pain, nausea. History of metastatic ovarian cancer. EXAM: CT ABDOMEN AND PELVIS WITH CONTRAST TECHNIQUE: Multidetector CT imaging of the abdomen and pelvis was performed using the standard protocol following bolus administration of intravenous contrast. CONTRAST:  17mL ISOVUE-300 IOPAMIDOL (ISOVUE-300) INJECTION 61% COMPARISON:  03/24/2017 FINDINGS: Motion degraded images. Lower chest: 11 mm nodule at the right lung base (series 7/image 3), new. Mild right lower lobe atelectasis. Cardiomegaly. Three vessel coronary atherosclerosis. Pacemaker leads, incompletely visualized. Hepatobiliary: 1.9 cm low-density lesion in segment 6 of the liver (series 4/image 30), with associated postsurgical change. Status post cholecystectomy. No intrahepatic or extrahepatic ductal dilatation. Pancreas: Within normal limits. Spleen: Within normal limits. Adrenals/Urinary Tract: Adrenal glands are within normal limits. Left renal cortical scarring/atrophy. Right lower pole cortical scarring (series 4/image 41). No hydronephrosis. Left bladder implants, including a dominant 2.1 cm lesion (series 4/image 35), previously 1.9 cm. Additional lesion along the left pubic symphysis is poorly visualized on the current study (series 4/image 77). Stomach/Bowel: Stomach is notable for a tiny hiatal hernia. No evidence of bowel obstruction. Normal appendix (series 4/image 47). Vascular/Lymphatic: No evidence of abdominal aortic aneurysm. Atherosclerotic calcifications of the abdominal aorta and branch vessels. No suspicious abdominopelvic lymphadenopathy. Reproductive: Status post hysterectomy. 5.6 x 8.2 cm predominantly cystic mass in the left adnexa/lower pelvis (series 4/image 69), previously 4.7 x 4.5 cm. Right medial aspect of the lesion has  hemorrhaged into the extraperitoneum, measuring approximately 9.2 x 9.4 cm superior to the mass (series 4/image 60) with additional fluid/hemorrhage in the anterior abdominal mesentery measuring approximately 4.4 x 15.6 cm (series 4/image 65). Other: Additional peritoneal disease/omental caking beneath the anterior abdominal wall, including a 2.8 cm cystic/solid implant beneath midline anterior abdominal wall mesh (series 4/image 63), previously 2.1 cm. Additional fluid/hemorrhage along the  liver, spleen, and right pericolic gutter. Musculoskeletal: Degenerative changes of the visualized thoracolumbar spine. IMPRESSION: Extraperitoneal fluid/hemorrhage related to the patient's dominant left pelvic tumor, as described above. Progression of the patient's metastatic ovarian cancer, as described above. Dominant cystic/solid mass in the left adnexa/lower pelvis has increased in size. Peritoneal disease/omental caking is mildly progressive. New pulmonary nodule at the right lung base, indeterminate. These results were called by telephone at the time of interpretation on 10/12/2017 at 9:36 am to Dr. Aletta Edouard , who verbally acknowledged these results. Electronically Signed   By: Julian Hy M.D.   On: 10/12/2017 09:41   Dg Chest Port 1 View  Result Date: 10/13/2017 CLINICAL DATA:  Shortness of Breath EXAM: PORTABLE CHEST 1 VIEW COMPARISON:  06/29/2017 FINDINGS: Left pacer remains in place, unchanged. Mild cardiomegaly with vascular congestion. Bilateral lower lobe airspace opacities. Small bilateral effusions. Worsening aeration in the bases since prior study. IMPRESSION: Small bilateral effusions with bibasilar atelectasis or infiltrate. Cardiomegaly with vascular congestion. Worsening aeration in the lung bases. Electronically Signed   By: Rolm Baptise M.D.   On: 10/13/2017 09:45    Labs:  CBC: Recent Labs    10/17/17 0516 10/22/17 1338 10/27/17 0821 10/28/17 1052  WBC 6.2 6.8 7.3 7.5  HGB 9.7*  11.2* 12.0 12.1  HCT 30.2* 33.0* 36.4 39.0  PLT 196 340 355 403*    COAGS: Recent Labs    08/26/17 1158 10/08/17 0913 10/12/17 0801 10/13/17 0533  INR 2.0 1.8* 3.21 1.27    BMP: Recent Labs    10/16/17 0449 10/17/17 0516 10/22/17 1338 10/27/17 0821  NA 141 140 143 143  K 3.3* 3.3* 3.4* 3.6  CL 104 104 102 103  CO2 29 30 35* 31  GLUCOSE 118* 110* 98 109*  BUN 14 15 17 16   CALCIUM 8.1* 8.4* 9.2 9.2  CREATININE 0.92 0.91 0.94 0.87  GFRNONAA 55* 56* 54* 59*  GFRAA >60 >60 >60 >60    LIVER FUNCTION TESTS: Recent Labs    10/08/17 0905 10/12/17 0801 10/22/17 1338 10/27/17 0821  BILITOT 0.6 1.1 1.0 0.8  AST 25 27 54* 30  ALT 21 21 42 29  ALKPHOS 110 72 109 101  PROT 6.5 5.5* 6.7 6.7  ALBUMIN 3.9 2.9* 2.8* 2.9*    TUMOR MARKERS: No results for input(s): AFPTM, CEA, CA199, CHROMGRNA in the last 8760 hours.  Assessment and Plan: Patient with extensive cardiac history presents with complaint of ovarian cancer.  She has plans for upcoming chemotherapy and is in need of durable access.  IR consulted for Port-A-Cath placement at the request of Dr. Alvy Bimler. Case reviewed by Dr. Earleen Newport who approves patient for procedure.  Patient presents today in their usual state of health.  She has been NPO and is not currently on blood thinners as they have been held for several weeks.    Risks and benefits of image guided port-a-catheter placement was discussed with the patient including, but not limited to bleeding, infection, pneumothorax, or fibrin sheath development and need for additional procedures.  All of the patient's questions were answered, patient is agreeable to proceed. Consent signed and in chart.   Thank you for this interesting consult.  I greatly enjoyed meeting Jilian E Iten and look forward to participating in their care.  A copy of this report was sent to the requesting provider on this date.  Electronically Signed: Docia Barrier,  PA 10/28/2017, 11:58 AM   I spent a total of  30 Minutes  in face to face in clinical consultation, greater than 50% of which was counseling/coordinating care for ovarian cancer.

## 2017-10-28 NOTE — Assessment & Plan Note (Signed)
She is taking laxatives on a regular basis with regular bowel movement. She has no clinical signs or symptoms of bowel obstruction.

## 2017-10-28 NOTE — Assessment & Plan Note (Signed)
She is gradually improving.  She will continue physical therapy as tolerated. She will get advanced home care service nursing staff to assess her clinical progress once a week

## 2017-10-28 NOTE — Procedures (Signed)
Interventional Radiology Procedure Note  Procedure: Placement of a right IJ approach single lumen PowerPort.  Tip is positioned at the superior cavoatrial junction and catheter is ready for immediate use.  Complications: None Recommendations:  - Ok to shower tomorrow - Do not submerge for 7 days - Routine line care   Signed,  Abeni Finchum S. Millard Bautch, DO   

## 2017-10-29 ENCOUNTER — Inpatient Hospital Stay: Payer: Medicare Other

## 2017-10-29 VITALS — BP 129/61 | HR 75 | Temp 98.7°F | Resp 18

## 2017-10-29 DIAGNOSIS — C569 Malignant neoplasm of unspecified ovary: Secondary | ICD-10-CM | POA: Diagnosis not present

## 2017-10-29 DIAGNOSIS — Z90710 Acquired absence of both cervix and uterus: Secondary | ICD-10-CM | POA: Diagnosis not present

## 2017-10-29 DIAGNOSIS — E440 Moderate protein-calorie malnutrition: Secondary | ICD-10-CM | POA: Diagnosis not present

## 2017-10-29 DIAGNOSIS — Z90722 Acquired absence of ovaries, bilateral: Secondary | ICD-10-CM | POA: Diagnosis not present

## 2017-10-29 DIAGNOSIS — Z87891 Personal history of nicotine dependence: Secondary | ICD-10-CM | POA: Diagnosis not present

## 2017-10-29 DIAGNOSIS — Z23 Encounter for immunization: Secondary | ICD-10-CM | POA: Diagnosis not present

## 2017-10-29 MED ORDER — DIPHENHYDRAMINE HCL 50 MG/ML IJ SOLN
12.5000 mg | Freq: Once | INTRAMUSCULAR | Status: AC
  Administered 2017-10-29: 12.5 mg via INTRAVENOUS

## 2017-10-29 MED ORDER — HEPARIN SOD (PORK) LOCK FLUSH 100 UNIT/ML IV SOLN
500.0000 [IU] | Freq: Once | INTRAVENOUS | Status: AC | PRN
  Administered 2017-10-29: 500 [IU]
  Filled 2017-10-29: qty 5

## 2017-10-29 MED ORDER — SODIUM CHLORIDE 0.9% FLUSH
10.0000 mL | INTRAVENOUS | Status: DC | PRN
  Administered 2017-10-29: 10 mL
  Filled 2017-10-29: qty 10

## 2017-10-29 MED ORDER — DIPHENHYDRAMINE HCL 50 MG/ML IJ SOLN
INTRAMUSCULAR | Status: AC
  Filled 2017-10-29: qty 1

## 2017-10-29 MED ORDER — PALONOSETRON HCL INJECTION 0.25 MG/5ML
0.2500 mg | Freq: Once | INTRAVENOUS | Status: AC
  Administered 2017-10-29: 0.25 mg via INTRAVENOUS

## 2017-10-29 MED ORDER — FAMOTIDINE IN NACL 20-0.9 MG/50ML-% IV SOLN
INTRAVENOUS | Status: AC
  Filled 2017-10-29: qty 50

## 2017-10-29 MED ORDER — FAMOTIDINE IN NACL 20-0.9 MG/50ML-% IV SOLN
20.0000 mg | Freq: Once | INTRAVENOUS | Status: AC
  Administered 2017-10-29: 20 mg via INTRAVENOUS

## 2017-10-29 MED ORDER — PALONOSETRON HCL INJECTION 0.25 MG/5ML
INTRAVENOUS | Status: AC
  Filled 2017-10-29: qty 5

## 2017-10-29 MED ORDER — SODIUM CHLORIDE 0.9 % IV SOLN
387.5000 mg | Freq: Once | INTRAVENOUS | Status: AC
  Administered 2017-10-29: 390 mg via INTRAVENOUS
  Filled 2017-10-29: qty 39

## 2017-10-29 MED ORDER — SODIUM CHLORIDE 0.9 % IV SOLN
Freq: Once | INTRAVENOUS | Status: AC
  Administered 2017-10-29: 08:00:00 via INTRAVENOUS
  Filled 2017-10-29: qty 250

## 2017-10-29 MED ORDER — SODIUM CHLORIDE 0.9 % IV SOLN
Freq: Once | INTRAVENOUS | Status: AC
  Administered 2017-10-29: 09:00:00 via INTRAVENOUS
  Filled 2017-10-29: qty 5

## 2017-10-29 NOTE — Patient Instructions (Addendum)
[facility] Discharge Instructions for Patients Receiving Chemotherapy  Today you received the following chemotherapy agents Carboplatin To help prevent nausea and vomiting after your treatment, we encourage you to take your nausea medication as directed   If you develop nausea and vomiting that is not controlled by your nausea medication, call the clinic.   BELOW ARE SYMPTOMS THAT SHOULD BE REPORTED IMMEDIATELY:  *FEVER GREATER THAN 100.5 F  *CHILLS WITH OR WITHOUT FEVER  NAUSEA AND VOMITING THAT IS NOT CONTROLLED WITH YOUR NAUSEA MEDICATION  *UNUSUAL SHORTNESS OF BREATH  *UNUSUAL BRUISING OR BLEEDING  TENDERNESS IN MOUTH AND THROAT WITH OR WITHOUT PRESENCE OF ULCERS  *URINARY PROBLEMS  *BOWEL PROBLEMS  UNUSUAL RASH Items with * indicate a potential emergency and should be followed up as soon as possible.  Feel free to call the clinic should you have any questions or concerns. The clinic phone number is [phone].  Please show the CHEMO ALERT CARD at check-in to the Emergency Department and triage nurse.      Carboplatin injection What is this medicine? CARBOPLATIN (KAR boe pla tin) is a chemotherapy drug. It targets fast dividing cells, like cancer cells, and causes these cells to die. This medicine is used to treat ovarian cancer and many other cancers. This medicine may be used for other purposes; ask your health care provider or pharmacist if you have questions. COMMON BRAND NAME(S): Paraplatin What should I tell my health care provider before I take this medicine? They need to know if you have any of these conditions: -blood disorders -hearing problems -kidney disease -recent or ongoing radiation therapy -an unusual or allergic reaction to carboplatin, cisplatin, other chemotherapy, other medicines, foods, dyes, or preservatives -pregnant or trying to get pregnant -breast-feeding How should I use this medicine? This drug is usually given as  an infusion into a vein. It is administered in a hospital or clinic by a specially trained health care professional. Talk to your pediatrician regarding the use of this medicine in children. Special care may be needed. Overdosage: If you think you have taken too much of this medicine contact a poison control center or emergency room at once. NOTE: This medicine is only for you. Do not share this medicine with others. What if I miss a dose? It is important not to miss a dose. Call your doctor or health care professional if you are unable to keep an appointment. What may interact with this medicine? -medicines for seizures -medicines to increase blood counts like filgrastim, pegfilgrastim, sargramostim -some antibiotics like amikacin, gentamicin, neomycin, streptomycin, tobramycin -vaccines Talk to your doctor or health care professional before taking any of these medicines: -acetaminophen -aspirin -ibuprofen -ketoprofen -naproxen This list may not describe all possible interactions. Give your health care provider a list of all the medicines, herbs, non-prescription drugs, or dietary supplements you use. Also tell them if you smoke, drink alcohol, or use illegal drugs. Some items may interact with your medicine. What should I watch for while using this medicine? Your condition will be monitored carefully while you are receiving this medicine. You will need important blood work done while you are taking this medicine. This drug may make you feel generally unwell. This is not uncommon, as chemotherapy can affect healthy cells as well as cancer cells. Report any side effects. Continue your course of treatment even though you feel ill unless your doctor tells you to stop. In some cases, you may be given additional medicines to help with side   effects. Follow all directions for their use. Call your doctor or health care professional for advice if you get a fever, chills or sore throat, or other  symptoms of a cold or flu. Do not treat yourself. This drug decreases your body's ability to fight infections. Try to avoid being around people who are sick. This medicine may increase your risk to bruise or bleed. Call your doctor or health care professional if you notice any unusual bleeding. Be careful brushing and flossing your teeth or using a toothpick because you may get an infection or bleed more easily. If you have any dental work done, tell your dentist you are receiving this medicine. Avoid taking products that contain aspirin, acetaminophen, ibuprofen, naproxen, or ketoprofen unless instructed by your doctor. These medicines may hide a fever. Do not become pregnant while taking this medicine. Women should inform their doctor if they wish to become pregnant or think they might be pregnant. There is a potential for serious side effects to an unborn child. Talk to your health care professional or pharmacist for more information. Do not breast-feed an infant while taking this medicine. What side effects may I notice from receiving this medicine? Side effects that you should report to your doctor or health care professional as soon as possible: -allergic reactions like skin rash, itching or hives, swelling of the face, lips, or tongue -signs of infection - fever or chills, cough, sore throat, pain or difficulty passing urine -signs of decreased platelets or bleeding - bruising, pinpoint red spots on the skin, black, tarry stools, nosebleeds -signs of decreased red blood cells - unusually weak or tired, fainting spells, lightheadedness -breathing problems -changes in hearing -changes in vision -chest pain -high blood pressure -low blood counts - This drug may decrease the number of white blood cells, red blood cells and platelets. You may be at increased risk for infections and bleeding. -nausea and vomiting -pain, swelling, redness or irritation at the injection site -pain, tingling,  numbness in the hands or feet -problems with balance, talking, walking -trouble passing urine or change in the amount of urine Side effects that usually do not require medical attention (report to your doctor or health care professional if they continue or are bothersome): -hair loss -loss of appetite -metallic taste in the mouth or changes in taste This list may not describe all possible side effects. Call your doctor for medical advice about side effects. You may report side effects to FDA at 1-800-FDA-1088. Where should I keep my medicine? This drug is given in a hospital or clinic and will not be stored at home. NOTE: This sheet is a summary. It may not cover all possible information. If you have questions about this medicine, talk to your doctor, pharmacist, or health care provider.  2018 [employer] (2007-04-27 14:38:05)     

## 2017-10-30 ENCOUNTER — Ambulatory Visit: Payer: Medicare Other | Admitting: Gynecologic Oncology

## 2017-10-30 ENCOUNTER — Telehealth: Payer: Self-pay

## 2017-10-30 ENCOUNTER — Encounter (HOSPITAL_COMMUNITY): Payer: Self-pay

## 2017-10-30 NOTE — Telephone Encounter (Signed)
-----   Message from [name], RN sent at 10/29/2017 10:10 AM EDT ----- Regarding: pt of Dr. Alvy Bimler first time chemo follow up call Pt of Dr. Alvy Bimler first time carboplatin follow up call.  Pt tolerated treatment well.

## 2017-10-30 NOTE — Telephone Encounter (Signed)
Called to follow up after first treatment. She is doing well with no problems.

## 2017-11-02 ENCOUNTER — Encounter (HOSPITAL_COMMUNITY): Payer: Self-pay

## 2017-11-02 ENCOUNTER — Ambulatory Visit: Payer: Medicare Other | Admitting: Hematology and Oncology

## 2017-11-03 ENCOUNTER — Other Ambulatory Visit: Payer: Self-pay

## 2017-11-03 ENCOUNTER — Telehealth: Payer: Self-pay

## 2017-11-03 ENCOUNTER — Encounter (HOSPITAL_COMMUNITY): Payer: Self-pay | Admitting: Emergency Medicine

## 2017-11-03 ENCOUNTER — Inpatient Hospital Stay (HOSPITAL_COMMUNITY)
Admission: EM | Admit: 2017-11-03 | Discharge: 2017-11-05 | DRG: 302 | Disposition: A | Discharge status: Home Health | Payer: Medicare Other | Attending: Internal Medicine | Admitting: Internal Medicine

## 2017-11-03 ENCOUNTER — Emergency Department (HOSPITAL_COMMUNITY): Payer: Medicare Other

## 2017-11-03 ENCOUNTER — Telehealth: Payer: Self-pay | Admitting: Interventional Cardiology

## 2017-11-03 DIAGNOSIS — I25118 Atherosclerotic heart disease of native coronary artery with other forms of angina pectoris: Secondary | ICD-10-CM | POA: Diagnosis not present

## 2017-11-03 DIAGNOSIS — C569 Malignant neoplasm of unspecified ovary: Secondary | ICD-10-CM | POA: Diagnosis not present

## 2017-11-03 DIAGNOSIS — I4819 Other persistent atrial fibrillation: Secondary | ICD-10-CM | POA: Diagnosis present

## 2017-11-03 DIAGNOSIS — I5040 Unspecified combined systolic (congestive) and diastolic (congestive) heart failure: Secondary | ICD-10-CM | POA: Diagnosis not present

## 2017-11-03 DIAGNOSIS — I5041 Acute combined systolic (congestive) and diastolic (congestive) heart failure: Secondary | ICD-10-CM | POA: Diagnosis not present

## 2017-11-03 DIAGNOSIS — I10 Essential (primary) hypertension: Secondary | ICD-10-CM | POA: Diagnosis present

## 2017-11-03 DIAGNOSIS — Z91030 Bee allergy status: Secondary | ICD-10-CM

## 2017-11-03 DIAGNOSIS — K661 Hemoperitoneum: Secondary | ICD-10-CM | POA: Diagnosis present

## 2017-11-03 DIAGNOSIS — Z9989 Dependence on other enabling machines and devices: Secondary | ICD-10-CM

## 2017-11-03 DIAGNOSIS — I130 Hypertensive heart and chronic kidney disease with heart failure and stage 1 through stage 4 chronic kidney disease, or unspecified chronic kidney disease: Secondary | ICD-10-CM | POA: Diagnosis not present

## 2017-11-03 DIAGNOSIS — Z87891 Personal history of nicotine dependence: Secondary | ICD-10-CM

## 2017-11-03 DIAGNOSIS — Z950 Presence of cardiac pacemaker: Secondary | ICD-10-CM

## 2017-11-03 DIAGNOSIS — R739 Hyperglycemia, unspecified: Secondary | ICD-10-CM | POA: Diagnosis present

## 2017-11-03 DIAGNOSIS — R079 Chest pain, unspecified: Secondary | ICD-10-CM | POA: Diagnosis not present

## 2017-11-03 DIAGNOSIS — I4821 Permanent atrial fibrillation: Secondary | ICD-10-CM | POA: Diagnosis present

## 2017-11-03 DIAGNOSIS — Z66 Do not resuscitate: Secondary | ICD-10-CM | POA: Diagnosis present

## 2017-11-03 DIAGNOSIS — I248 Other forms of acute ischemic heart disease: Secondary | ICD-10-CM | POA: Diagnosis present

## 2017-11-03 DIAGNOSIS — R0789 Other chest pain: Secondary | ICD-10-CM | POA: Diagnosis present

## 2017-11-03 DIAGNOSIS — R0602 Shortness of breath: Secondary | ICD-10-CM | POA: Diagnosis not present

## 2017-11-03 DIAGNOSIS — I5042 Chronic combined systolic (congestive) and diastolic (congestive) heart failure: Secondary | ICD-10-CM | POA: Diagnosis not present

## 2017-11-03 DIAGNOSIS — I499 Cardiac arrhythmia, unspecified: Secondary | ICD-10-CM | POA: Diagnosis not present

## 2017-11-03 DIAGNOSIS — D500 Iron deficiency anemia secondary to blood loss (chronic): Secondary | ICD-10-CM | POA: Diagnosis not present

## 2017-11-03 DIAGNOSIS — D6481 Anemia due to antineoplastic chemotherapy: Secondary | ICD-10-CM | POA: Diagnosis present

## 2017-11-03 DIAGNOSIS — N183 Chronic kidney disease, stage 3 (moderate): Secondary | ICD-10-CM | POA: Diagnosis not present

## 2017-11-03 DIAGNOSIS — E876 Hypokalemia: Secondary | ICD-10-CM | POA: Diagnosis not present

## 2017-11-03 DIAGNOSIS — I5043 Acute on chronic combined systolic (congestive) and diastolic (congestive) heart failure: Secondary | ICD-10-CM | POA: Diagnosis present

## 2017-11-03 DIAGNOSIS — I2510 Atherosclerotic heart disease of native coronary artery without angina pectoris: Secondary | ICD-10-CM | POA: Diagnosis not present

## 2017-11-03 DIAGNOSIS — Z7901 Long term (current) use of anticoagulants: Secondary | ICD-10-CM

## 2017-11-03 DIAGNOSIS — I213 ST elevation (STEMI) myocardial infarction of unspecified site: Secondary | ICD-10-CM | POA: Diagnosis not present

## 2017-11-03 DIAGNOSIS — G4733 Obstructive sleep apnea (adult) (pediatric): Secondary | ICD-10-CM

## 2017-11-03 DIAGNOSIS — Z888 Allergy status to other drugs, medicaments and biological substances status: Secondary | ICD-10-CM

## 2017-11-03 DIAGNOSIS — D630 Anemia in neoplastic disease: Secondary | ICD-10-CM | POA: Diagnosis present

## 2017-11-03 DIAGNOSIS — Z955 Presence of coronary angioplasty implant and graft: Secondary | ICD-10-CM

## 2017-11-03 DIAGNOSIS — Z79899 Other long term (current) drug therapy: Secondary | ICD-10-CM

## 2017-11-03 DIAGNOSIS — I8390 Asymptomatic varicose veins of unspecified lower extremity: Secondary | ICD-10-CM | POA: Diagnosis present

## 2017-11-03 DIAGNOSIS — I447 Left bundle-branch block, unspecified: Secondary | ICD-10-CM | POA: Diagnosis not present

## 2017-11-03 DIAGNOSIS — E785 Hyperlipidemia, unspecified: Secondary | ICD-10-CM | POA: Diagnosis present

## 2017-11-03 DIAGNOSIS — Z8249 Family history of ischemic heart disease and other diseases of the circulatory system: Secondary | ICD-10-CM

## 2017-11-03 DIAGNOSIS — D638 Anemia in other chronic diseases classified elsewhere: Secondary | ICD-10-CM | POA: Diagnosis present

## 2017-11-03 DIAGNOSIS — B0229 Other postherpetic nervous system involvement: Secondary | ICD-10-CM | POA: Diagnosis not present

## 2017-11-03 DIAGNOSIS — I252 Old myocardial infarction: Secondary | ICD-10-CM

## 2017-11-03 DIAGNOSIS — I739 Peripheral vascular disease, unspecified: Secondary | ICD-10-CM | POA: Diagnosis present

## 2017-11-03 HISTORY — DX: Malignant neoplasm of unspecified ovary: C56.9

## 2017-11-03 HISTORY — DX: Hemorrhage, not elsewhere classified: R58

## 2017-11-03 LAB — CBC
HCT: 34.9 % — ABNORMAL LOW (ref 36.0–46.0)
Hemoglobin: 11 g/dL — ABNORMAL LOW (ref 12.0–15.0)
MCH: 30.6 pg (ref 26.0–34.0)
MCHC: 31.5 g/dL (ref 30.0–36.0)
MCV: 97.2 fL (ref 78.0–100.0)
PLATELETS: 224 10*3/uL (ref 150–400)
RBC: 3.59 MIL/uL — ABNORMAL LOW (ref 3.87–5.11)
RDW: 14.8 % (ref 11.5–15.5)
WBC: 6.2 10*3/uL (ref 4.0–10.5)

## 2017-11-03 LAB — BASIC METABOLIC PANEL
Anion gap: 8 (ref 5–15)
BUN: 20 mg/dL (ref 8–23)
CHLORIDE: 99 mmol/L (ref 98–111)
CO2: 30 mmol/L (ref 22–32)
CREATININE: 0.95 mg/dL (ref 0.44–1.00)
Calcium: 8.5 mg/dL — ABNORMAL LOW (ref 8.9–10.3)
GFR calc non Af Amer: 53 mL/min — ABNORMAL LOW (ref >60)
Glucose, Bld: 240 mg/dL — ABNORMAL HIGH (ref 70–99)
Potassium: 3.2 mmol/L — ABNORMAL LOW (ref 3.5–5.1)
SODIUM: 137 mmol/L (ref 135–145)

## 2017-11-03 LAB — I-STAT TROPONIN, ED: TROPONIN I, POC: 0.04 ng/mL (ref 0.00–0.08)

## 2017-11-03 LAB — BRAIN NATRIURETIC PEPTIDE: B Natriuretic Peptide: 373.6 pg/mL — ABNORMAL HIGH (ref 0.0–100.0)

## 2017-11-03 MED ORDER — DOCUSATE SODIUM 100 MG PO CAPS
100.0000 mg | ORAL_CAPSULE | Freq: Two times a day (BID) | ORAL | Status: DC
  Administered 2017-11-04 – 2017-11-05 (×3): 100 mg via ORAL
  Filled 2017-11-03 (×3): qty 1

## 2017-11-03 MED ORDER — ISOSORBIDE MONONITRATE ER 30 MG PO TB24
60.0000 mg | ORAL_TABLET | Freq: Every day | ORAL | Status: DC
  Administered 2017-11-04 – 2017-11-05 (×2): 60 mg via ORAL
  Filled 2017-11-03 (×2): qty 2

## 2017-11-03 MED ORDER — ACETAMINOPHEN 325 MG PO TABS: 650.0000 mg | ORAL_TABLET | ORAL | Status: DC | PRN

## 2017-11-03 MED ORDER — MULTIVITAMINS PO CAPS: 1.0000 | ORAL_CAPSULE | Freq: Every day | ORAL | Status: DC

## 2017-11-03 MED ORDER — METOPROLOL TARTRATE 25 MG PO TABS
100.0000 mg | ORAL_TABLET | Freq: Two times a day (BID) | ORAL | Status: DC
  Administered 2017-11-03 – 2017-11-05 (×4): 100 mg via ORAL
  Filled 2017-11-03 (×4): qty 4

## 2017-11-03 MED ORDER — ACETAMINOPHEN 500 MG PO TABS: 500.0000 mg | ORAL_TABLET | Freq: Every evening | ORAL | Status: DC | PRN

## 2017-11-03 MED ORDER — SODIUM CHLORIDE 0.9% FLUSH
3.0000 mL | Freq: Two times a day (BID) | INTRAVENOUS | Status: DC
  Administered 2017-11-04 – 2017-11-05 (×2): 3 mL via INTRAVENOUS

## 2017-11-03 MED ORDER — POTASSIUM CHLORIDE 20 MEQ PO PACK: 40.0000 meq | PACK | Freq: Once | ORAL | Status: DC

## 2017-11-03 MED ORDER — DILTIAZEM HCL ER COATED BEADS 120 MG PO CP24
120.0000 mg | ORAL_CAPSULE | Freq: Every day | ORAL | Status: DC
  Administered 2017-11-03 – 2017-11-04 (×2): 120 mg via ORAL
  Filled 2017-11-03 (×2): qty 1

## 2017-11-03 MED ORDER — ATORVASTATIN CALCIUM 20 MG PO TABS
80.0000 mg | ORAL_TABLET | ORAL | Status: DC
  Administered 2017-11-04 – 2017-11-05 (×2): 80 mg via ORAL
  Filled 2017-11-03 (×2): qty 4

## 2017-11-03 MED ORDER — FISH OIL 1200 MG PO CAPS: 1200.0000 mg | ORAL_CAPSULE | Freq: Every day | ORAL | Status: DC

## 2017-11-03 MED ORDER — NITROGLYCERIN 0.4 MG SL SUBL: 0.4000 mg | SUBLINGUAL_TABLET | SUBLINGUAL | Status: DC | PRN

## 2017-11-03 MED ORDER — HYDROMORPHONE HCL 2 MG PO TABS: 2.0000 mg | ORAL_TABLET | ORAL | Status: DC | PRN

## 2017-11-03 MED ORDER — CALCIUM CARBONATE-VITAMIN D 500-200 MG-UNIT PO TABS
1.0000 | ORAL_TABLET | Freq: Every evening | ORAL | Status: DC
  Administered 2017-11-04: 1 via ORAL
  Filled 2017-11-03: qty 1

## 2017-11-03 MED ORDER — FUROSEMIDE 10 MG/ML IJ SOLN
40.0000 mg | Freq: Two times a day (BID) | INTRAMUSCULAR | Status: DC
  Administered 2017-11-04 – 2017-11-05 (×3): 40 mg via INTRAVENOUS
  Filled 2017-11-03 (×3): qty 4

## 2017-11-03 MED ORDER — OCUVITE PO TABS: 1.0000 | ORAL_TABLET | Freq: Two times a day (BID) | ORAL | Status: DC

## 2017-11-03 MED ORDER — PROCHLORPERAZINE MALEATE 5 MG PO TABS: 10.0000 mg | ORAL_TABLET | Freq: Four times a day (QID) | ORAL | Status: DC | PRN

## 2017-11-03 MED ORDER — DILTIAZEM HCL ER COATED BEADS 240 MG PO CP24
240.0000 mg | ORAL_CAPSULE | Freq: Every morning | ORAL | Status: DC
  Administered 2017-11-04 – 2017-11-05 (×2): 240 mg via ORAL
  Filled 2017-11-03 (×2): qty 1

## 2017-11-03 MED ORDER — SPIRONOLACTONE 25 MG PO TABS: 25.0000 mg | ORAL_TABLET | Freq: Every evening | ORAL | Status: DC | PRN

## 2017-11-03 MED ORDER — CALCIUM CARB-CHOLECALCIFEROL 600-800 MG-UNIT PO TABS: 1.0000 | ORAL_TABLET | Freq: Every evening | ORAL | Status: DC

## 2017-11-03 MED ORDER — POTASSIUM CHLORIDE 20 MEQ PO PACK
20.0000 meq | PACK | Freq: Once | ORAL | Status: AC
  Administered 2017-11-03: 20 meq via ORAL
  Filled 2017-11-03 (×2): qty 1

## 2017-11-03 MED ORDER — GABAPENTIN 300 MG PO CAPS: 300.0000 mg | ORAL_CAPSULE | Freq: Two times a day (BID) | ORAL | Status: DC

## 2017-11-03 MED ORDER — FUROSEMIDE 10 MG/ML IJ SOLN: 40.0000 mg | Freq: Once | INTRAMUSCULAR | Status: DC

## 2017-11-03 MED ORDER — FUROSEMIDE 20 MG PO TABS: 80.0000 mg | ORAL_TABLET | Freq: Every day | ORAL | Status: DC

## 2017-11-03 MED ORDER — ADULT MULTIVITAMIN W/MINERALS CH
1.0000 | ORAL_TABLET | Freq: Every day | ORAL | Status: DC
  Administered 2017-11-04 – 2017-11-05 (×2): 1 via ORAL
  Filled 2017-11-03 (×2): qty 1

## 2017-11-03 MED ORDER — SODIUM CHLORIDE 0.9% FLUSH: 3.0000 mL | INTRAVENOUS | Status: DC | PRN

## 2017-11-03 MED ORDER — SODIUM CHLORIDE 0.9 % IV SOLN: 250.0000 mL | INTRAVENOUS | Status: DC | PRN

## 2017-11-03 MED ORDER — COENZYME Q-10 100 MG PO CAPS: 100.0000 mg | ORAL_CAPSULE | Freq: Every day | ORAL | Status: DC

## 2017-11-03 MED ORDER — FUROSEMIDE 10 MG/ML IJ SOLN
40.0000 mg | Freq: Once | INTRAMUSCULAR | Status: AC
  Administered 2017-11-03: 40 mg via INTRAVENOUS
  Filled 2017-11-03: qty 4

## 2017-11-03 MED ORDER — POTASSIUM CHLORIDE CRYS ER 20 MEQ PO TBCR
20.0000 meq | EXTENDED_RELEASE_TABLET | Freq: Every day | ORAL | Status: DC
  Administered 2017-11-03 – 2017-11-04 (×2): 20 meq via ORAL
  Filled 2017-11-03 (×2): qty 1

## 2017-11-03 NOTE — ED Triage Notes (Signed)
Pt arrived via [facility]. Pt has extensive cardiac history. Pt has 2 pacemakers, the battery is dead on one and the other is not firing according to EMS. Pt is having centralized pressure in her chest and SOB. Pt started chemo last Thursday for cancer in her abdomen. Pt took 3 nitro before EMS arrived. Pt not given aspirin because cardiologist does not want her taking anti-coagulants due to a recent GI bleed.  BP 122/78, O2 92% %

## 2017-11-03 NOTE — ED Notes (Signed)
Pt placed on [employer]ick  

## 2017-11-03 NOTE — ED Notes (Signed)
Patient transported to X-ray 

## 2017-11-03 NOTE — H&P (Signed)
History and Physical    Patty [name] TDS:[id number] DOB: 01-05-1931 DOA: 11/03/2017  PCP: [name], MD  Patient coming from: home  I have personally briefly reviewed patient's old medical records in [place]  Chief Complaint: CP, SOB  HPI: Patty [name] is a 84 y.o. female with medical history significant of coronary artery disease status post stenting, ovarian cancer currently undergoing chemo, recent abdominal bleed while on anticoagulation presents with chest pain shortness of breath.  Today patient had left-sided chest pain associated shortness of breath.  She also had  short-lived palpitations.  She took 3 nitro and by the time EMS arrived her pain resolved.  She has had episodes like this in the past.  She thinks anxiety has some component as well.  Patient had a intra-abdominal bleed earlier this month however she has been taken off of all anticoagulants and aspirin.  She has been compliant with her meds.  Patient has ovarian cancer her last chemo was 1 week ago.  She follows with Dr. Alvy Bimler .  ED Course: Chest x-ray shows trace effusions.  Troponin negative x1.  Patient was seen in consultation by her cardiologist in the ED.  He did not think she had acute cardiac syndrome however he thought her symptoms were due to volume overload and A. fib.  He recommended overnight admission for diuresis.  She was given 1 dose of IV Lasix at 6 PM.  Review of Systems: Denies nausea vomiting diaphoresis urinary symptoms All others reviewed with patient  and are  negative unless otherwise stated   Past Medical History:  Diagnosis Date  . Bleeding behind the abdominal cavity 10/2017  . Chronic anticoagulation - coumadin, CHADS2VASC=6 05/17/2015  . CKD (chronic kidney disease), stage III ([place])   . Combined systolic and diastolic heart failure ([place])   . Coronary artery disease    a. s/p multiple stents - stenting x 2 to the LAD and x 1 to the LCx in 2000, rotational arthrectomy to  proximal LCx and DES to LAD in 2014 and DES to LCx in 2014, along with DES to LAD for re-in-stent stenosis in 2015, and DES to ostial LAD in 2016. b. 07/2016 - orbital atherectomy & DES to mid LCx.  . Diverticulitis   . Granulosa cell carcinoma ([place])    abd; last episode was in 2009  . Hyperlipidemia   . Hypertension   . LBBB (left bundle branch block)       . Myocardial infarction ([place]) 2002  . Obesity   . OSA on CPAP   . Ovarian ca ([place]) 2019  . Pacemaker failure    a. Prior leadless PPM with premature battery failure, being managed conservatively without replacement.  . Peripheral vascular disease ([place])   . Permanent atrial fibrillation 2013  . Renal artery stenosis ([place])   . Tachycardia-bradycardia syndrome ([place])    a. s/p leadless pacemaker ([name]) implanted by Dr [name]  . Varicose veins     Past Surgical History:  Procedure Laterality Date  . ABDOMINAL HYSTERECTOMY    . CARDIAC CATHETERIZATION  09/03/2007   EF 70%; Failed attempt at PCI to OM  . CARDIAC CATHETERIZATION  11/01/2003   EF 70%  . CARDIAC CATHETERIZATION N/A 12/14/2015   Procedure: Left Heart Cath and Coronary Angiography;  Surgeon: Burnell Blanks, MD;  Location: [facility];  Service: Cardiovascular;  Laterality: N/A;  . CARDIOVERSION  12/31/2011   Procedure: CARDIOVERSION;  Surgeon: [name], MD;  Location:  [facility];  Service: Cardiovascular;  Laterality: N/A;  . CARDIOVERSION N/A 12/31/2011   Procedure: CARDIOVERSION;  Surgeon: [name], MD;  Location: [facility] CATH LAB;  Service: Cardiovascular;  Laterality: N/A;  . CATARACT EXTRACTION, BILATERAL  2015  . CHOLECYSTECTOMY  1980's  . COLON SURGERY  2004   colectomy for diverticulosis  . CORONARY ANGIOPLASTY WITH STENT PLACEMENT  2000; 08/11/2012; 11/12/2012   3 + 2 LAD & CFX; 2nd CFX stent 11/12/2012  . CORONARY ATHERECTOMY N/A 07/15/2016   Procedure: Coronary Atherectomy;  Surgeon: Martinique, Peter M, MD;  Location: [facility];  Service: Cardiovascular;  Laterality: N/A;  . CORONARY BALLOON ANGIOPLASTY N/A 07/14/2016   Procedure: Coronary Balloon Angioplasty;  Surgeon: Martinique, Peter M, MD;  Location: [facility];  Service: Cardiovascular;  Laterality: N/A;  . CORONARY STENT INTERVENTION N/A 07/15/2016   Procedure: Coronary Stent Intervention;  Surgeon: Martinique, Peter M, MD;  Location: [facility];  Service: Cardiovascular;  Laterality: N/A;  . FRACTIONAL FLOW RESERVE WIRE  10/07/2013   Procedure: [place];  Surgeon: [name], MD;  Location: [facility] CATH LAB;  Service: Cardiovascular;;  . granulosa tumor excision  2000; 2003; 2004; 2007   "all in my abdomen including small intestines, outside my ?uterus/etc" (08/11/2012)  . HERNIA REPAIR  2005   "laparoscopic"  . IR IMAGING GUIDED PORT INSERTION  10/28/2017  . LEFT HEART CATH AND CORONARY ANGIOGRAPHY N/A 07/14/2016   Procedure: Left Heart Cath and Coronary Angiography;  Surgeon: Martinique, Peter M, MD;  Location: [facility];  Service: Cardiovascular;  Laterality: N/A;  . LEFT HEART CATHETERIZATION WITH CORONARY ANGIOGRAM N/A 11/12/2012   Procedure: LEFT HEART CATHETERIZATION WITH CORONARY ANGIOGRAM;  Surgeon: [name], MD;  Location: [facility] CATH LAB;  Service: Cardiovascular;  Laterality: N/A;  . LEFT HEART CATHETERIZATION WITH CORONARY ANGIOGRAM N/A 10/07/2013   Procedure: LEFT HEART CATHETERIZATION WITH CORONARY ANGIOGRAM;  Surgeon: [name], MD;  Location: [facility] CATH LAB;  Service: Cardiovascular;  Laterality: N/A;  . LEFT HEART CATHETERIZATION WITH CORONARY ANGIOGRAM N/A 12/14/2013   Procedure: LEFT HEART CATHETERIZATION WITH CORONARY ANGIOGRAM;  Surgeon: Sinclair Grooms, MD;  Location: [facility] CATH LAB;  Service: Cardiovascular;  Laterality: N/A;  . LEFT HEART CATHETERIZATION WITH CORONARY ANGIOGRAM N/A 05/16/2014   Procedure: LEFT HEART CATHETERIZATION WITH CORONARY ANGIOGRAM;  Surgeon: Sherren Mocha,  MD;  Location: [facility] CATH LAB;  Service: Cardiovascular;  Laterality: N/A;  . PERCUTANEOUS CORONARY INTERVENTION-BALLOON ONLY  08/04/2012   Procedure: PERCUTANEOUS CORONARY INTERVENTION-BALLOON ONLY;  Surgeon: [name], MD;  Location: [facility] CATH LAB;  Service: Cardiovascular;;  . PERCUTANEOUS CORONARY ROTOBLATOR INTERVENTION (PCI-R) N/A 08/11/2012   Procedure: PERCUTANEOUS CORONARY ROTOBLATOR INTERVENTION (PCI-R);  Surgeon: [name], MD;  Location: [facility] CATH LAB;  Service: Cardiovascular;  Laterality: N/A;  . PERCUTANEOUS CORONARY STENT INTERVENTION (PCI-S)  10/07/2013   Procedure: PERCUTANEOUS CORONARY STENT INTERVENTION (PCI-S);  Surgeon: [name], MD;  Location: [facility] CATH LAB;  Service: Cardiovascular;;  . PERMANENT PACEMAKER INSERTION N/A 03/16/2012   [name] (SJM) leadless pacemaker (LEADLESS II STUDY PATEINT)  . SALIVARY GLAND SURGERY  2000's   "had a little lump removed; granulosa related; it was benign" (08/11/2012)  . TEE WITHOUT CARDIOVERSION  12/31/2011   Procedure: TRANSESOPHAGEAL ECHOCARDIOGRAM (TEE);  Surgeon: [name], MD;  Location: [facility];  Service: Cardiovascular;  Laterality: N/A;  . VARICOSE VEIN SURGERY Bilateral 1977     reports that she quit smoking  about 43 years ago. Her smoking use included cigarettes. She has a 32.00 pack-year smoking history. She has never used smokeless tobacco. She reports that she drinks about 5.0 standard drinks of alcohol per week. She reports that she does not use drugs.  Allergies  Allergen Reactions  . Bee Venom Anaphylaxis  . Other Nausea And Vomiting and Other (See Comments)    Pain medications cause severe vomiting. Tolerated slow IV morphine drip  . Amiodarone Nausea Only  . Prednisone Other (See Comments)    "Rapid Heart Beat"    Family History  Problem Relation Age of Onset  . Stroke Mother   . Heart attack Father   . Diabetes Father   . Hypertension Father   . Heart attack Brother   .  Diabetes Brother   . Hypertension Brother   . Kidney failure Brother      Prior to Admission medications   Medication Sig Start Date End Date Taking? Authorizing Provider  acetaminophen (TYLENOL) 500 MG tablet Take 500 mg by mouth at bedtime as needed for mild pain.    Yes [provider]  atorvastatin (LIPITOR) 80 MG tablet TAKE 1 TABLET BY MOUTH EVERY MORNING Patient taking differently: Take 80 mg by mouth every morning.  05/04/17  Yes [name], MD  beta carotene w/minerals (OCUVITE) tablet Take 1 tablet by mouth 2 (two) times daily.    Yes [provider]  Calcium Carb-Cholecalciferol (CALTRATE 600+D) 600-800 MG-UNIT TABS Take 1 tablet by mouth every evening.    Yes [provider]  Coenzyme Q-10 100 MG capsule Take 100-200 mg by mouth daily.    Yes [provider]  diltiazem (CARDIZEM CD) 120 MG 24 hr capsule TAKE ONE CAPSULE BY MOUTH AT BEDTIME Patient taking differently: Take 120 mg by mouth at bedtime.  08/17/17  Yes Dunn, Dayna N, PA-C  diltiazem (CARDIZEM CD) 240 MG 24 hr capsule TAKE ONE CAPSULE BY MOUTH DAILY WITH BREAKFAST Patient taking differently: Take 240 mg by mouth daily.  09/03/17  Yes [name], MD  docusate sodium (COLACE) 100 MG capsule Take 100 mg by mouth 2 (two) times daily.   Yes [provider]  furosemide (LASIX) 40 MG tablet Take 80 mg by mouth daily.   Yes [provider]  HYDROmorphone (DILAUDID) 2 MG tablet Take 1 tablet (2 mg total) by mouth every 4 (four) hours as needed for severe pain. 10/19/17  Yes Gorsuch, Ni, MD  isosorbide mononitrate (IMDUR) 60 MG 24 hr tablet TAKE 1 TABLET BY MOUTH DAILY Patient taking differently: Take 60 mg by mouth daily.  04/17/17  Yes Seiler, Amber K, NP  KLOR-CON M20 20 MEQ tablet TAKE 1 TABLET BY MOUTH EVERY DAY Patient taking differently: Take 20 mEq by mouth at bedtime.  08/05/17  Yes [name], MD  lidocaine-prilocaine (EMLA) cream Apply 1  application topically as needed. Patient taking differently: Apply 1 application topically as needed (port access).  10/27/17  Yes Gorsuch, Ni, MD  metoprolol tartrate (LOPRESSOR) 100 MG tablet Take 1 tablet (100 mg total) by mouth every morning. Also takes (3) 25 mg tabs (75 mg) in the evening Patient taking differently: Take 100 mg by mouth 2 (two) times daily.  03/31/17  Yes Allred, Jeneen Rinks, MD  Multiple Vitamin (MULTIVITAMIN) capsule Take 1 capsule by mouth daily.   Yes [provider]  nitroGLYCERIN (NITROSTAT) 0.4 MG SL tablet PLACE 1 TABLET (0.4 MG TOTAL) UNDER THE TONGUE EVERY 5 (FIVE) MINUTES AS  NEEDED. FOR CHEST PAIN Patient taking differently: Place 0.4 mg under the tongue every 5 (five) minutes as needed for chest pain.  08/31/17  Yes Allred, Jeneen Rinks, MD  NON FORMULARY Place 1 each into the nose at bedtime. CPAP setting 3.5   Yes [provider]  Omega-3 Fatty Acids (FISH OIL) 1200 MG CAPS Take 1,200 mg by mouth daily.    Yes [provider]  ondansetron (ZOFRAN) 8 MG tablet Take 1 tablet (8 mg total) by mouth every 8 (eight) hours as needed for nausea. 10/19/17  Yes Heath Lark, MD  prochlorperazine (COMPAZINE) 10 MG tablet Take 1 tablet (10 mg total) by mouth every 6 (six) hours as needed for nausea or vomiting. 10/19/17  Yes Heath Lark, MD  spironolactone (ALDACTONE) 25 MG tablet Take 25 mg by mouth at bedtime as needed (swelling in ankles).    Yes [provider]  gabapentin (NEURONTIN) 300 MG capsule Take 1 capsule (300 mg total) by mouth 2 (two) times daily. Patient not taking: Reported on 11/03/2017 10/27/17   Heath Lark, MD    Physical Exam: Vitals:   11/03/17 1830 11/03/17 1845 11/03/17 1930 11/03/17 2011  BP: 139/61 134/85 (!) 144/74 137/77  Pulse: 74 81 86 85  Resp: (!) 29 (!) 26  18  Temp:    98 F (36.7 C)  TempSrc:    Oral  SpO2: 92% 93% 93% 91%  Weight:      Height:        Constitutional: NAD, calm, comfortable, sitting on the side  of the bed Vitals:   11/03/17 1830 11/03/17 1845 11/03/17 1930 11/03/17 2011  BP: 139/61 134/85 (!) 144/74 137/77  Pulse: 74 81 86 85  Resp: (!) 29 (!) 26  18  Temp:    98 F (36.7 C)  TempSrc:    Oral  SpO2: 92% 93% 93% 91%  Weight:      Height:       Eyes: PERRL, lids and conjunctivae normal ENMT: Mucous membranes are moist. Posterior pharynx clear of any exudate or lesions.Normal dentition.  Neck: normal, supple, no masses, no thyromegaly Respiratory: clear to auscultation bilaterally, no wheezing, no crackles. Normal respiratory effort. No accessory muscle use.  Cardiovascular: Regular rate and rhythm, no murmurs / rubs / gallops.  Trace pedal edema. 2+ pedal pulses.   Abdomen: no tenderness, no masses palpated. No hepatosplenomegaly. Bowel sounds positive.  Musculoskeletal: no clubbing / cyanosis. No joint deformity upper and lower extremities. Good ROM, no contractures. Normal muscle tone.  Skin: no rashes, lesions, ulcers. No induration Neurologic: CN 2-12 grossly intact.. Strength 5/5 in all 4.  Psychiatric: Normal judgment and insight. Alert and oriented x 3. Normal mood.     Labs on Admission: I have personally reviewed following labs and imaging studies  CBC: Recent Labs  Lab 10/28/17 1052 11/03/17 1254  WBC 7.5 6.2  HGB 12.1 11.0*  HCT 39.0 34.9*  MCV 97.5 97.2  PLT 403* 914   Basic Metabolic Panel: Recent Labs  Lab 11/03/17 1254  NA 137  K 3.2*  CL 99  CO2 30  GLUCOSE 240*  BUN 20  CREATININE 0.95  CALCIUM 8.5*   GFR: Estimated Creatinine Clearance: 40.3 mL/min (by C-G formula based on SCr of 0.95 mg/dL). Liver Function Tests: No results for input(s): AST, ALT, ALKPHOS, BILITOT, PROT, ALBUMIN in the last 168 hours. No results for input(s): LIPASE, AMYLASE in the last 168 hours. No results for input(s): AMMONIA in the last 168 hours.  Coagulation Profile: Recent Labs  Lab 10/28/17 1052  INR 1.10   Cardiac Enzymes: No results for input(s):  CKTOTAL, CKMB, CKMBINDEX, TROPONINI in the last 168 hours. BNP (last 3 results) No results for input(s): PROBNP in the last 8760 hours. HbA1C: No results for input(s): HGBA1C in the last 72 hours. CBG: No results for input(s): GLUCAP in the last 168 hours. Lipid Profile: No results for input(s): CHOL, HDL, LDLCALC, TRIG, CHOLHDL, LDLDIRECT in the last 72 hours. Thyroid Function Tests: No results for input(s): TSH, T4TOTAL, FREET4, T3FREE, THYROIDAB in the last 72 hours. Anemia Panel: No results for input(s): VITAMINB12, FOLATE, FERRITIN, TIBC, IRON, RETICCTPCT in the last 72 hours. Urine analysis:    Component Value Date/Time   COLORURINE YELLOW 10/12/2017 0916   APPEARANCEUR CLEAR 10/12/2017 0916   LABSPEC >1.046 (H) 10/12/2017 0916   PHURINE 6.0 10/12/2017 0916   GLUCOSEU NEGATIVE 10/12/2017 0916   HGBUR NEGATIVE 10/12/2017 0916   BILIRUBINUR NEGATIVE 10/12/2017 0916   KETONESUR NEGATIVE 10/12/2017 0916   PROTEINUR NEGATIVE 10/12/2017 0916   UROBILINOGEN 0.2 12/11/2013 1119   NITRITE NEGATIVE 10/12/2017 0916   LEUKOCYTESUR TRACE (A) 10/12/2017 0916    Radiological Exams on Admission: Dg Chest 2 View  Result Date: 11/03/2017 CLINICAL DATA:  Shortness of breath and chest pressure. EXAM: CHEST - 2 VIEW COMPARISON:  10/15/2017 FINDINGS: A new right jugular Port-A-Cath terminates over the upper SVC. A left subclavian approach pacemaker remains in place with leads terminating over the right atrium and right ventricle. A leadless pacemaker is also again noted projecting over the right ventricle. The cardiac silhouette remains enlarged. Aortic atherosclerosis is noted. There is improved aeration of the lung bases with minimal atelectasis remaining as well as possibly trace bilateral pleural effusions. No overt pulmonary edema is evident. No pneumothorax is seen. No acute osseous abnormality is identified. IMPRESSION: Improved aeration of the lung bases with minimal residual atelectasis and  possible trace pleural effusions. Electronically Signed   By: Logan Bores M.D.   On: 11/03/2017 13:44    EKG: Independently reviewed.  Sinus rhythm QT 521 left bundle branch block chronic abnormal EKG  Assessment/Plan Active Problems:   CAD (coronary artery disease)   Combined systolic and diastolic heart failure, NYHA class 3 ([name])   Chest pain   OSA on CPAP   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[name])   Persistent atrial fibrillation   Intraperitoneal hemorrhage   CHF (congestive heart failure), NYHA class II, acute, combined ([name]) Hyperglycemia hypokalemia  -Admit to telemetry, IV Lasix, follow ins and outs and daily weights, restrict fluids.  Cycle cardiac enzymes.  Supplemental oxygen to maintain oxygen sats as needed, cardiology to follow along .  Continue home  IMDUR - statin and metoprolol -Continue home CPAP -Anemia stable hemoglobin RUNS  11-12 since mid month. -Ovarian cancer status post first chemo 1 week ago. -Replace potassium p.o. -Personal history of borderline blood sugars in the past, positive family history.  Blood sugar 240 today.  Check hemoglobin A1c unsure if she got some steroids with her chemo 1 week ago but  none ongoing.   DVT prophylaxis: SCD Code Status: DNR Disposition Plan: home 1 day   Consults called: Dr Irish Lack Admission status: obs  Tele    Shelbie Proctor MD [facility] Pager [phone]  If 7PM-7AM, please contact night-coverage [email or web address] Password [email or web address]  11/03/2017, 10:06 PM

## 2017-11-03 NOTE — ED Provider Notes (Signed)
84 year old female here with chest pain.  Patient is well-known to Dr. Irish Lack  Patient initially seen by Dr. Vanita Panda and is awaiting cardiology consult.  Cardiology has seen and recommends medicine admission for IV diuresis.  Serial troponins.  EKG here is nonischemic.   [name], MD 11/03/17 [phone]

## 2017-11-03 NOTE — ED Notes (Signed)
EKG COMPLETE.

## 2017-11-03 NOTE — ED Provider Notes (Signed)
[facility] EMERGENCY DEPARTMENT Provider Note   CSN: [id number] Arrival date & time: 11/03/17  1247     History   Chief Complaint Chief Complaint  Patient presents with  . Chest Pain    HPI Patty [name] is a 84 y.o. female.  HPI  Presents with concern of dyspnea and fatigue. Patient has multiple medical issues including CKD, CHF, CAD, and ongoing therapy for granulosa cell carcinoma. Patient notes that 1 week ago she began receiving chemotherapy for her malignancy. Prior to this, with concern for internal bleeding secondary to cancer she had cessation of her antiplatelet and anti-coagulant medication. She notes that today she has had episodic chest pain, occurring with activity, improving with nitroglycerin, and currently not present. She has required a total of 3 doses of nitroglycerin today for pain relief, and is currently asymptomatic. No fever, no cough.  And there is associated generalized fatigue, as well as dyspnea, particularly with exertion.   Past Medical History:  Diagnosis Date  . Chronic anticoagulation - coumadin, CHADS2VASC=6 05/17/2015  . CKD (chronic kidney disease), stage III ([place])   . Combined systolic and diastolic heart failure ([place])   . Coronary artery disease    a. s/p multiple stents - stenting x 2 to the LAD and x 1 to the LCx in 2000, rotational arthrectomy to proximal LCx and DES to LAD in 2014 and DES to LCx in 2014, along with DES to LAD for re-in-stent stenosis in 2015, and DES to ostial LAD in 2016. b. 07/2016 - orbital atherectomy & DES to mid LCx.  . Diverticulitis   . Granulosa cell carcinoma ([place])    abd; last episode was in 2009  . Hyperlipidemia   . Hypertension   . LBBB (left bundle branch block)       . Myocardial infarction ([place]) 2002  . Obesity   . OSA on CPAP   . Pacemaker failure    a. Prior leadless PPM with premature battery failure, being managed conservatively without replacement.  . Peripheral  vascular disease ([place])   . Permanent atrial fibrillation 2013  . Renal artery stenosis ([place])   . Tachycardia-bradycardia syndrome ([place])    a. s/p leadless pacemaker ([name]) implanted by Dr [name]  . Varicose veins     Patient Active Problem List   Diagnosis Date Noted  . Protein-calorie malnutrition, moderate ([place]) 10/28/2017  . Other constipation 10/20/2017  . Postherpetic neuralgia 10/20/2017  . Cancer associated pain 10/20/2017  . Physical debility 10/20/2017  . Goals of care, counseling/discussion 10/20/2017  . Ovarian cancer ([place]) 10/19/2017  . Intraperitoneal hemorrhage   . Abdominal pain 10/12/2017  . Persistent atrial fibrillation   . Warfarin anticoagulation   . DOE (dyspnea on exertion)   . Status post coronary artery stent placement   . Normocytic anemia 07/11/2016  . History of small bowel obstruction 12/22/2015  . Permanent atrial fibrillation with RVR 12/15/2015  . Demand ischemia ([place]) 12/15/2015  . NSTEMI (non-ST elevated myocardial infarction) ([place]) 05/18/2015  . Chronic anticoagulation - coumadin, CHADS2VASC=6 05/17/2015  . Unstable angina ([place]) 05/17/2015  . Swelling of lower extremity 04/18/2015  . Encounter for therapeutic drug monitoring 08/25/2014  . 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[place]) 06/16/2014  . Ischemic chest pain ([place])   . OSA on CPAP 05/14/2014  . Chest pain 01/05/2014  . Atrial flutter ([place])   . Hypokalemia   . Pacemaker - St Jude Leadless PPM   . Combined systolic and diastolic heart failure,  NYHA class 3 ([name])   . Tachycardia-bradycardia syndrome ([place]) 09/30/2012  . Atrial fibrillation with RVR ([place]) 12/28/2011  . Ventral hernia 10/16/2011  . Granulosa cell tumor of ovary 04/23/2011  . CAD (coronary artery disease) 10/03/2010  . Essential hypertension 10/03/2010  . Hyperlipidemia 10/03/2010  . Renal artery stenosis ([place]) 10/03/2010  . Obesity 10/03/2010    Past Surgical History:  Procedure Laterality Date  . ABDOMINAL  HYSTERECTOMY    . CARDIAC CATHETERIZATION  09/03/2007   EF 70%; Failed attempt at PCI to OM  . CARDIAC CATHETERIZATION  11/01/2003   EF 70%  . CARDIAC CATHETERIZATION N/A 12/14/2015   Procedure: Left Heart Cath and Coronary Angiography;  Surgeon: Burnell Blanks, MD;  Location: [facility] CV LAB;  Service: Cardiovascular;  Laterality: N/A;  . CARDIOVERSION  12/31/2011   Procedure: CARDIOVERSION;  Surgeon: [name], MD;  Location: [facility] ENDOSCOPY;  Service: Cardiovascular;  Laterality: N/A;  . CARDIOVERSION N/A 12/31/2011   Procedure: CARDIOVERSION;  Surgeon: [name], MD;  Location: [facility] CATH LAB;  Service: Cardiovascular;  Laterality: N/A;  . CATARACT EXTRACTION, BILATERAL  2015  . CHOLECYSTECTOMY  1980's  . COLON SURGERY  2004   colectomy for diverticulosis  . CORONARY ANGIOPLASTY WITH STENT PLACEMENT  2000; 08/11/2012; 11/12/2012   3 + 2 LAD & CFX; 2nd CFX stent 11/12/2012  . CORONARY ATHERECTOMY N/A 07/15/2016   Procedure: Coronary Atherectomy;  Surgeon: Martinique, Peter M, MD;  Location: [facility] CV LAB;  Service: Cardiovascular;  Laterality: N/A;  . CORONARY BALLOON ANGIOPLASTY N/A 07/14/2016   Procedure: Coronary Balloon Angioplasty;  Surgeon: Martinique, Peter M, MD;  Location: [facility] CV LAB;  Service: Cardiovascular;  Laterality: N/A;  . CORONARY STENT INTERVENTION N/A 07/15/2016   Procedure: Coronary Stent Intervention;  Surgeon: Martinique, Peter M, MD;  Location: [facility] CV LAB;  Service: Cardiovascular;  Laterality: N/A;  . FRACTIONAL FLOW RESERVE WIRE  10/07/2013   Procedure: [place] ;  Surgeon: [name], MD;  Location: [facility] CATH LAB;  Service: Cardiovascular;;  . granulosa tumor excision  2000; 2003; 2004; 2007   "all in my abdomen including small intestines, outside my ?uterus/etc" (08/11/2012)  . HERNIA REPAIR  2005   "laparoscopic"  . IR IMAGING GUIDED PORT INSERTION  10/28/2017  . LEFT HEART CATH AND CORONARY ANGIOGRAPHY N/A  07/14/2016   Procedure: Left Heart Cath and Coronary Angiography;  Surgeon: Martinique, Peter M, MD;  Location: [facility] CV LAB;  Service: Cardiovascular;  Laterality: N/A;  . LEFT HEART CATHETERIZATION WITH CORONARY ANGIOGRAM N/A 11/12/2012   Procedure: LEFT HEART CATHETERIZATION WITH CORONARY ANGIOGRAM;  Surgeon: [name], MD;  Location: [facility] CATH LAB;  Service: Cardiovascular;  Laterality: N/A;  . LEFT HEART CATHETERIZATION WITH CORONARY ANGIOGRAM N/A 10/07/2013   Procedure: LEFT HEART CATHETERIZATION WITH CORONARY ANGIOGRAM;  Surgeon: [name], MD;  Location: [facility] CATH LAB;  Service: Cardiovascular;  Laterality: N/A;  . LEFT HEART CATHETERIZATION WITH CORONARY ANGIOGRAM N/A 12/14/2013   Procedure: LEFT HEART CATHETERIZATION WITH CORONARY ANGIOGRAM;  Surgeon: Sinclair Grooms, MD;  Location: [facility] CATH LAB;  Service: Cardiovascular;  Laterality: N/A;  . LEFT HEART CATHETERIZATION WITH CORONARY ANGIOGRAM N/A 05/16/2014   Procedure: LEFT HEART CATHETERIZATION WITH CORONARY ANGIOGRAM;  Surgeon: Sherren Mocha, MD;  Location: [facility] CATH LAB;  Service: Cardiovascular;  Laterality: N/A;  . PERCUTANEOUS CORONARY INTERVENTION-BALLOON ONLY  08/04/2012   Procedure: PERCUTANEOUS CORONARY INTERVENTION-BALLOON ONLY;  Surgeon: [name], MD;  Location: [facility] CATH  LAB;  Service: Cardiovascular;;  . PERCUTANEOUS CORONARY ROTOBLATOR INTERVENTION (PCI-R) N/A 08/11/2012   Procedure: PERCUTANEOUS CORONARY ROTOBLATOR INTERVENTION (PCI-R);  Surgeon: [name], MD;  Location: [facility] CATH LAB;  Service: Cardiovascular;  Laterality: N/A;  . PERCUTANEOUS CORONARY STENT INTERVENTION (PCI-S)  10/07/2013   Procedure: PERCUTANEOUS CORONARY STENT INTERVENTION (PCI-S);  Surgeon: [name], MD;  Location: [facility] CATH LAB;  Service: Cardiovascular;;  . PERMANENT PACEMAKER INSERTION N/A 03/16/2012   [name] (SJM) leadless pacemaker (LEADLESS II STUDY PATEINT)  . SALIVARY GLAND SURGERY  2000's   "had a little  lump removed; granulosa related; it was benign" (08/11/2012)  . TEE WITHOUT CARDIOVERSION  12/31/2011   Procedure: TRANSESOPHAGEAL ECHOCARDIOGRAM (TEE);  Surgeon: [name], MD;  Location: [facility];  Service: Cardiovascular;  Laterality: N/A;  . VARICOSE VEIN SURGERY Bilateral 1977     OB History   None      Home Medications    Prior to Admission medications   Medication Sig Start Date End Date Taking? Authorizing Provider  acetaminophen (TYLENOL) 500 MG tablet Take 500 mg by mouth at bedtime as needed for mild pain.    Yes [provider]  atorvastatin (LIPITOR) 80 MG tablet TAKE 1 TABLET BY MOUTH EVERY MORNING Patient taking differently: Take 80 mg by mouth every morning.  05/04/17  Yes [name], MD  beta carotene w/minerals (OCUVITE) tablet Take 1 tablet by mouth 2 (two) times daily.    Yes [provider]  Calcium Carb-Cholecalciferol (CALTRATE 600+D) 600-800 MG-UNIT TABS Take 1 tablet by mouth every evening.    Yes [provider]  Coenzyme Q-10 100 MG capsule Take 100-200 mg by mouth daily.    Yes [provider]  diltiazem (CARDIZEM CD) 120 MG 24 hr capsule TAKE ONE CAPSULE BY MOUTH AT BEDTIME Patient taking differently: Take 120 mg by mouth at bedtime.  08/17/17  Yes Dunn, Dayna N, PA-C  diltiazem (CARDIZEM CD) 240 MG 24 hr capsule TAKE ONE CAPSULE BY MOUTH DAILY WITH BREAKFAST Patient taking differently: Take 240 mg by mouth daily.  09/03/17  Yes [name], MD  docusate sodium (COLACE) 100 MG capsule Take 100 mg by mouth 2 (two) times daily.   Yes [provider]  furosemide (LASIX) 40 MG tablet Take 80 mg by mouth daily.   Yes [provider]  HYDROmorphone (DILAUDID) 2 MG tablet Take 1 tablet (2 mg total) by mouth every 4 (four) hours as needed for severe pain. 10/19/17  Yes Gorsuch, Ni, MD  isosorbide mononitrate (IMDUR) 60 MG 24 hr tablet TAKE 1 TABLET BY MOUTH DAILY Patient taking  differently: Take 60 mg by mouth daily.  04/17/17  Yes Seiler, Amber K, NP  KLOR-CON M20 20 MEQ tablet TAKE 1 TABLET BY MOUTH EVERY DAY Patient taking differently: Take 20 mEq by mouth at bedtime.  08/05/17  Yes [name], MD  lidocaine-prilocaine (EMLA) cream Apply 1 application topically as needed. Patient taking differently: Apply 1 application topically as needed (port access).  10/27/17  Yes Gorsuch, Ni, MD  metoprolol tartrate (LOPRESSOR) 100 MG tablet Take 1 tablet (100 mg total) by mouth every morning. Also takes (3) 25 mg tabs (75 mg) in the evening Patient taking differently: Take 100 mg by mouth 2 (two) times daily.  03/31/17  Yes Allred, Jeneen Rinks, MD  Multiple Vitamin (MULTIVITAMIN) capsule Take 1 capsule by mouth daily.   Yes [provider]  nitroGLYCERIN (NITROSTAT) 0.4 MG SL tablet PLACE 1 TABLET (0.4  MG TOTAL) UNDER THE TONGUE EVERY 5 (FIVE) MINUTES AS NEEDED. FOR CHEST PAIN Patient taking differently: Place 0.4 mg under the tongue every 5 (five) minutes as needed for chest pain.  08/31/17  Yes Allred, Jeneen Rinks, MD  NON FORMULARY Place 1 each into the nose at bedtime. CPAP setting 3.5   Yes [provider]  Omega-3 Fatty Acids (FISH OIL) 1200 MG CAPS Take 1,200 mg by mouth daily.    Yes [provider]  ondansetron (ZOFRAN) 8 MG tablet Take 1 tablet (8 mg total) by mouth every 8 (eight) hours as needed for nausea. 10/19/17  Yes Heath Lark, MD  prochlorperazine (COMPAZINE) 10 MG tablet Take 1 tablet (10 mg total) by mouth every 6 (six) hours as needed for nausea or vomiting. 10/19/17  Yes Heath Lark, MD  spironolactone (ALDACTONE) 25 MG tablet Take 25 mg by mouth at bedtime as needed (swelling in ankles).    Yes [provider]  gabapentin (NEURONTIN) 300 MG capsule Take 1 capsule (300 mg total) by mouth 2 (two) times daily. Patient not taking: Reported on 11/03/2017 10/27/17   Heath Lark, MD    Family History Family History  Problem Relation Age  of Onset  . Stroke Mother   . Heart attack Father   . Diabetes Father   . Hypertension Father   . Heart attack Brother   . Diabetes Brother   . Hypertension Brother   . Kidney failure Brother     Social History Social History   Tobacco Use  . Smoking status: Former Smoker    Packs/day: 1.00    Years: 32.00    Pack years: 32.00    Types: Cigarettes    Last attempt to quit: 02/03/1974    Years since quitting: 43.7  . Smokeless tobacco: Never Used  Substance Use Topics  . Alcohol use: Yes    Alcohol/week: 5.0 standard drinks    Types: 5 Glasses of wine per week    Comment: 2017 WINE WITH DINNER   . Drug use: No     Allergies   Bee venom; Other; Amiodarone; and Prednisone   Review of Systems Review of Systems  Constitutional:       Per HPI, otherwise negative  HENT:       Per HPI, otherwise negative  Respiratory:       Per HPI, otherwise negative  Cardiovascular:       Per HPI, otherwise negative  Gastrointestinal: Negative for vomiting.  Endocrine:       Negative aside from HPI  Genitourinary:       Neg aside from HPI   Musculoskeletal:       Per HPI, otherwise negative  Skin: Negative.   Allergic/Immunologic: Positive for immunocompromised state.  Neurological: Positive for weakness. Negative for syncope.     Physical Exam Updated Vital Signs BP 132/80   Pulse 81   Temp 98.2 F (36.8 C) (Oral)   Resp 18   Ht 5' (1.524 m)   Wt 78.9 kg   SpO2 94%   BMI 33.98 kg/m   Physical Exam  Constitutional: She is oriented to person, place, and time. She appears well-developed and well-nourished. No distress.  HENT:  Head: Normocephalic and atraumatic.  Eyes: Conjunctivae and EOM are normal.  Cardiovascular: Normal rate and regular rhythm.  Pulmonary/Chest: Effort normal and breath sounds normal. No stridor. No respiratory distress.  Abdominal: She exhibits no distension.  Musculoskeletal: She exhibits no edema.  Neurological: She is alert and oriented  to person, place, and time. No cranial nerve deficit.  Skin: Skin is warm and dry.  Psychiatric: She has a normal mood and affect.  Nursing note and vitals reviewed.    ED Treatments / Results  Labs (all labs ordered are listed, but only abnormal results are displayed) Labs Reviewed  BASIC METABOLIC PANEL - Abnormal; Notable for the following components:      Result Value   Potassium 3.2 (*)    Glucose, Bld 240 (*)    Calcium 8.5 (*)    GFR calc non Af Amer 53 (*)    All other components within normal limits  CBC - Abnormal; Notable for the following components:   RBC 3.59 (*)    Hemoglobin 11.0 (*)    HCT 34.9 (*)    All other components within normal limits  BRAIN NATRIURETIC PEPTIDE - Abnormal; Notable for the following components:   B Natriuretic Peptide 373.6 (*)    All other components within normal limits  I-STAT TROPONIN, ED    EKG  EKG with paced rhythm, rate 80, ST-T wave changes, abnormal   Radiology Dg Chest 2 View  Result Date: 11/03/2017 CLINICAL DATA:  Shortness of breath and chest pressure. EXAM: CHEST - 2 VIEW COMPARISON:  10/15/2017 FINDINGS: A new right jugular Port-A-Cath terminates over the upper SVC. A left subclavian approach pacemaker remains in place with leads terminating over the right atrium and right ventricle. A leadless pacemaker is also again noted projecting over the right ventricle. The cardiac silhouette remains enlarged. Aortic atherosclerosis is noted. There is improved aeration of the lung bases with minimal atelectasis remaining as well as possibly trace bilateral pleural effusions. No overt pulmonary edema is evident. No pneumothorax is seen. No acute osseous abnormality is identified. IMPRESSION: Improved aeration of the lung bases with minimal residual atelectasis and possible trace pleural effusions. Electronically Signed   By: Logan Bores M.D.   On: 11/03/2017 13:44    Procedures Procedures (including critical care  time)  Medications Ordered in ED Medications - No data to display   Initial Impression / Assessment and Plan / ED Course  I have reviewed the triage vital signs and the nursing notes.  Pertinent labs & imaging results that were available during my care of the patient were reviewed by me and considered in my medical decision making (see chart for details).     3:53 PM Patient in similar condition, no recurrence of chest pain, but notes that she has had ongoing dyspnea, as well as polyuria.  We discussed her medications and she confirms that she has stopped her Coumadin during her chemotherapy. She continues to take Lasix as previously prescribed.  Given the patient's recent cessation of anticoagulant and antiplatelet medication, with new chest pain, relieved with nitroglycerin, as well as concern for her fluid overload status given her ongoing use of Lasix, but increasing BNP value, I discussed case with her cardiology team who will evaluate the patient has a consulting service. In spite of these concerns, the patient is awake, alert, hemodynamically unremarkable, afebrile. Disposition pending cardiology evaluation/consultation.  Dr. Ellender Hose is aware of the patient's presentation and will follow her course.   Final Clinical Impressions(s) / ED Diagnoses  Atypical chest pain Shortness of breath   Carmin Muskrat, MD 11/03/17 1557

## 2017-11-03 NOTE — ED Notes (Signed)
Lab called to add on BNP to current blood draws that have been sent

## 2017-11-03 NOTE — Telephone Encounter (Signed)
Nurse with [facility] called and left a message. Requesting verbal order to see patient 2 x a week x 3 weeks.  Called back and given verbal order for above to nurse. Patty [name] is going to ER. She had chest tightness and took nitro x 3 at home. Cardiology was notified and she going to ER now.

## 2017-11-03 NOTE — Telephone Encounter (Signed)
Left message for patient to call back  

## 2017-11-03 NOTE — Progress Notes (Signed)
Patient refused CPAP.  Patient states she will only be here for one night but if she is her longer she will have her kids bring her home CPAP.

## 2017-11-03 NOTE — ED Notes (Signed)
Attempted report 

## 2017-11-03 NOTE — Telephone Encounter (Signed)
New message:      Pt c/o of Chest Pain: STAT if CP now or developed within 24 hours  1. Are you having CP right now? No  2. Are you experiencing any other symptoms (ex. SOB, nausea, vomiting, sweating)? SOB  3. How long have you been experiencing CP? Since this morning  4. Is your CP continuous or coming and going? Coming and going  5. Have you taken Nitroglycerin? Yes 2 ? Donita from [facility] is just wanting to let us know this information. She states she told the pt to just call 911 the next time this happens. She would still like for [place] to follow up with the pt. BP 138/88 Pulse 120

## 2017-11-03 NOTE — Consult Note (Addendum)
Cardiology Consultation:   Patient ID: Patty [name]; [id number]; Sep 05, 1930   Admit date: 11/03/2017 Date of Consult: 11/03/2017  Primary Care Provider: [name], MD Primary Cardiologist: Dr. Irish Lack, MD   Patient Profile:   Patty [name] is a 84 y.o. female with a hx of atrial fibrillation not on anticoagulation, CKD stage III, OSA on CPAP, chronic combined systolic and diastolic CHF, CAD s/p multiple stents and ongoing therapy for ovarian granulosa cell carcinoma  (started chemo 1 week ago ) who is being seen today for the evaluation of chest pain at the request of Dr. Vanita Panda.   History of Present Illness:   Patty [name] is an 84 year old female with a complicated history who presented to [facility] on 11/03/2017 with complaints of one episode of chest pain which occurred with activity and improved with nitroglycerin x3 and no recurrence since ED presentation.  Patient states that yesterday morning approximately 8:30 AM she woke with chest pressure and took 1 SL NTG with relief. She had advanced home care RN come to the house for an appointment post hospital follow-up.  At that time she took an additional SL NTGx2  and given her persistent chest pressure proceeded to call EMS for transport to [facility] for further evaluation.  Patient has a long history of CAD which includes multiple DES/PCI starting in 2000 with a last cardiac cath 07/2016 with a DES to mid LCx.  She was last seen in consultation during her recent hospitalization for preoperative clearance for anticoagulation assistance in anticipation for OB/GYN surgery for tumor removal.  During that time, given that her most recent stent was placed greater than 12 months prior and in the absence of ACS symptoms, patient was cleared for surgery.  Unfortunately, her hospital course was complicated by an acute life-threatening bleed intra-abdominal bleed.  She was then taken off all anticoagulation secondary to hemorrhage from left pelvic tumor.  Follow-up CT revealed improving peritoneal hemorrhage however recommendations were for her to remain off all anticoagulation. On 10/17/2017 cardiology signed off in anticipation of discharge with plans to discontinue Plavix and Coumadin due to life-threatening bleed and follow-up on 11/09/2017.   Per chart review, on 10/16/2017 patient had palpitations and chest discomfort which resolved with NTG noted to be a typical pattern for her with atrial fibrillation flareups.  She was noted to be in atrial fibrillation with RVR at that time with plans to continue current medications and monitor telemetry.  In the ED, BNP found to be moderately elevated at 373.  EKG revealed paced rhythm with nonspecific changes, similar to recent prior tracings.  I-STAT troponin elevated at 0.04.  Potassium was noted to be mildly low at 3.2.  Creatinine stable at 0.95.  CXR showed improved aeration of the lung bases with minimal residual atelectasis and possible trace pleural effusions.  Patient currently denies chest pain, palpitations, dizziness, presyncopal or syncopal episodes.  She reports recent initiation of chemotherapy proximally 1 week ago and reports that she is tolerating well.  She does state that she has been increasing her oral hydration and states that she likely could be volume overloaded.   Past Medical History:  Diagnosis Date  . Chronic anticoagulation - coumadin, CHADS2VASC=6 05/17/2015  . CKD (chronic kidney disease), stage III ([place])   . Combined systolic and diastolic heart failure ([place])   . Coronary artery disease    a. s/p multiple stents - stenting x 2 to the LAD and x 1 to the LCx in 2000, rotational arthrectomy  to proximal LCx and DES to LAD in 2014 and DES to LCx in 2014, along with DES to LAD for re-in-stent stenosis in 2015, and DES to ostial LAD in 2016. b. 07/2016 - orbital atherectomy & DES to mid LCx.  . Diverticulitis   . Granulosa cell carcinoma ([place])    abd; last episode was in 2009  .  Hyperlipidemia   . Hypertension   . LBBB (left bundle branch block)       . Myocardial infarction ([place]) 2002  . Obesity   . OSA on CPAP   . Pacemaker failure    a. Prior leadless PPM with premature battery failure, being managed conservatively without replacement.  . Peripheral vascular disease ([place])   . Permanent atrial fibrillation 2013  . Renal artery stenosis ([place])   . Tachycardia-bradycardia syndrome ([place])    a. s/p leadless pacemaker ([name]) implanted by Dr [name]  . Varicose veins     Past Surgical History:  Procedure Laterality Date  . ABDOMINAL HYSTERECTOMY    . CARDIAC CATHETERIZATION  09/03/2007   EF 70%; Failed attempt at PCI to OM  . CARDIAC CATHETERIZATION  11/01/2003   EF 70%  . CARDIAC CATHETERIZATION N/A 12/14/2015   Procedure: Left Heart Cath and Coronary Angiography;  Surgeon: Burnell Blanks, MD;  Location: [place] CV LAB;  Service: Cardiovascular;  Laterality: N/A;  . CARDIOVERSION  12/31/2011   Procedure: CARDIOVERSION;  Surgeon: [name], MD;  Location: [facility] ENDOSCOPY;  Service: Cardiovascular;  Laterality: N/A;  . CARDIOVERSION N/A 12/31/2011   Procedure: CARDIOVERSION;  Surgeon: [name], MD;  Location: [facility] CATH LAB;  Service: Cardiovascular;  Laterality: N/A;  . CATARACT EXTRACTION, BILATERAL  2015  . CHOLECYSTECTOMY  1980's  . COLON SURGERY  2004   colectomy for diverticulosis  . CORONARY ANGIOPLASTY WITH STENT PLACEMENT  2000; 08/11/2012; 11/12/2012   3 + 2 LAD & CFX; 2nd CFX stent 11/12/2012  . CORONARY ATHERECTOMY N/A 07/15/2016   Procedure: Coronary Atherectomy;  Surgeon: Martinique, Peter M, MD;  Location: [place] CV LAB;  Service: Cardiovascular;  Laterality: N/A;  . CORONARY BALLOON ANGIOPLASTY N/A 07/14/2016   Procedure: Coronary Balloon Angioplasty;  Surgeon: Martinique, Peter M, MD;  Location: [place] CV LAB;  Service: Cardiovascular;  Laterality: N/A;  . CORONARY STENT INTERVENTION N/A 07/15/2016   Procedure:  Coronary Stent Intervention;  Surgeon: Martinique, Peter M, MD;  Location: [place] CV LAB;  Service: Cardiovascular;  Laterality: N/A;  . FRACTIONAL FLOW RESERVE WIRE  10/07/2013   Procedure: [place];  Surgeon: [name], MD;  Location: [facility] CATH LAB;  Service: Cardiovascular;;  . granulosa tumor excision  2000; 2003; 2004; 2007   "all in my abdomen including small intestines, outside my ?uterus/etc" (08/11/2012)  . HERNIA REPAIR  2005   "laparoscopic"  . IR IMAGING GUIDED PORT INSERTION  10/28/2017  . LEFT HEART CATH AND CORONARY ANGIOGRAPHY N/A 07/14/2016   Procedure: Left Heart Cath and Coronary Angiography;  Surgeon: Martinique, Peter M, MD;  Location: [place] CV LAB;  Service: Cardiovascular;  Laterality: N/A;  . LEFT HEART CATHETERIZATION WITH CORONARY ANGIOGRAM N/A 11/12/2012   Procedure: LEFT HEART CATHETERIZATION WITH CORONARY ANGIOGRAM;  Surgeon: [name], MD;  Location: [facility] CATH LAB;  Service: Cardiovascular;  Laterality: N/A;  . LEFT HEART CATHETERIZATION WITH CORONARY ANGIOGRAM N/A 10/07/2013   Procedure: LEFT HEART CATHETERIZATION WITH CORONARY ANGIOGRAM;  Surgeon: [name], MD;  Location: [facility] CATH LAB;  Service: Cardiovascular;  Laterality: N/A;  . LEFT HEART CATHETERIZATION WITH CORONARY ANGIOGRAM N/A 12/14/2013   Procedure: LEFT HEART CATHETERIZATION WITH CORONARY ANGIOGRAM;  Surgeon: Sinclair Grooms, MD;  Location: [facility] CATH LAB;  Service: Cardiovascular;  Laterality: N/A;  . LEFT HEART CATHETERIZATION WITH CORONARY ANGIOGRAM N/A 05/16/2014   Procedure: LEFT HEART CATHETERIZATION WITH CORONARY ANGIOGRAM;  Surgeon: Sherren Mocha, MD;  Location: [facility] CATH LAB;  Service: Cardiovascular;  Laterality: N/A;  . PERCUTANEOUS CORONARY INTERVENTION-BALLOON ONLY  08/04/2012   Procedure: PERCUTANEOUS CORONARY INTERVENTION-BALLOON ONLY;  Surgeon: [name], MD;  Location: [facility] CATH LAB;  Service: Cardiovascular;;  . PERCUTANEOUS CORONARY  ROTOBLATOR INTERVENTION (PCI-R) N/A 08/11/2012   Procedure: PERCUTANEOUS CORONARY ROTOBLATOR INTERVENTION (PCI-R);  Surgeon: [name], MD;  Location: [facility] CATH LAB;  Service: Cardiovascular;  Laterality: N/A;  . PERCUTANEOUS CORONARY STENT INTERVENTION (PCI-S)  10/07/2013   Procedure: PERCUTANEOUS CORONARY STENT INTERVENTION (PCI-S);  Surgeon: [name], MD;  Location: [facility] CATH LAB;  Service: Cardiovascular;;  . PERMANENT PACEMAKER INSERTION N/A 03/16/2012   [name] (SJM) leadless pacemaker (LEADLESS II STUDY PATEINT)  . SALIVARY GLAND SURGERY  2000's   "had a little lump removed; granulosa related; it was benign" (08/11/2012)  . TEE WITHOUT CARDIOVERSION  12/31/2011   Procedure: TRANSESOPHAGEAL ECHOCARDIOGRAM (TEE);  Surgeon: [name], MD;  Location: [place];  Service: Cardiovascular;  Laterality: N/A;  . VARICOSE VEIN SURGERY Bilateral 1977     Prior to Admission medications   Medication Sig Start Date End Date Taking? Authorizing Provider  acetaminophen (TYLENOL) 500 MG tablet Take 500 mg by mouth at bedtime as needed for mild pain.    Yes [provider]  atorvastatin (LIPITOR) 80 MG tablet TAKE 1 TABLET BY MOUTH EVERY MORNING Patient taking differently: Take 80 mg by mouth every morning.  05/04/17  Yes [name], MD  beta carotene w/minerals (OCUVITE) tablet Take 1 tablet by mouth 2 (two) times daily.    Yes [provider]  Calcium Carb-Cholecalciferol (CALTRATE 600+D) 600-800 MG-UNIT TABS Take 1 tablet by mouth every evening.    Yes [provider]  Coenzyme Q-10 100 MG capsule Take 100-200 mg by mouth daily.    Yes [provider]  diltiazem (CARDIZEM CD) 120 MG 24 hr capsule TAKE ONE CAPSULE BY MOUTH AT BEDTIME Patient taking differently: Take 120 mg by mouth at bedtime.  08/17/17  Yes Dunn, Dayna N, PA-C  diltiazem (CARDIZEM CD) 240 MG 24 hr capsule TAKE ONE CAPSULE BY MOUTH DAILY WITH BREAKFAST Patient taking  differently: Take 240 mg by mouth daily.  09/03/17  Yes [name], MD  docusate sodium (COLACE) 100 MG capsule Take 100 mg by mouth 2 (two) times daily.   Yes [provider]  furosemide (LASIX) 40 MG tablet Take 80 mg by mouth daily.   Yes [provider]  HYDROmorphone (DILAUDID) 2 MG tablet Take 1 tablet (2 mg total) by mouth every 4 (four) hours as needed for severe pain. 10/19/17  Yes Gorsuch, Ni, MD  isosorbide mononitrate (IMDUR) 60 MG 24 hr tablet TAKE 1 TABLET BY MOUTH DAILY Patient taking differently: Take 60 mg by mouth daily.  04/17/17  Yes Seiler, Amber K, NP  KLOR-CON M20 20 MEQ tablet TAKE 1 TABLET BY MOUTH EVERY DAY Patient taking differently: Take 20 mEq by mouth at bedtime.  08/05/17  Yes [name], MD  lidocaine-prilocaine (EMLA) cream Apply 1 application topically as needed. Patient taking differently: Apply 1 application topically as needed (  port access).  10/27/17  Yes Gorsuch, Ni, MD  metoprolol tartrate (LOPRESSOR) 100 MG tablet Take 1 tablet (100 mg total) by mouth every morning. Also takes (3) 25 mg tabs (75 mg) in the evening Patient taking differently: Take 100 mg by mouth 2 (two) times daily.  03/31/17  Yes Allred, Jeneen Rinks, MD  Multiple Vitamin (MULTIVITAMIN) capsule Take 1 capsule by mouth daily.   Yes [provider]  nitroGLYCERIN (NITROSTAT) 0.4 MG SL tablet PLACE 1 TABLET (0.4 MG TOTAL) UNDER THE TONGUE EVERY 5 (FIVE) MINUTES AS NEEDED. FOR CHEST PAIN Patient taking differently: Place 0.4 mg under the tongue every 5 (five) minutes as needed for chest pain.  08/31/17  Yes Allred, Jeneen Rinks, MD  NON FORMULARY Place 1 each into the nose at bedtime. CPAP setting 3.5   Yes [provider]  Omega-3 Fatty Acids (FISH OIL) 1200 MG CAPS Take 1,200 mg by mouth daily.    Yes [provider]  ondansetron (ZOFRAN) 8 MG tablet Take 1 tablet (8 mg total) by mouth every 8 (eight) hours as needed for nausea. 10/19/17  Yes Heath Lark, MD  prochlorperazine (COMPAZINE) 10 MG tablet Take 1 tablet (10 mg total) by mouth every 6 (six) hours as needed for nausea or vomiting. 10/19/17  Yes Heath Lark, MD  spironolactone (ALDACTONE) 25 MG tablet Take 25 mg by mouth at bedtime as needed (swelling in ankles).    Yes [provider]  gabapentin (NEURONTIN) 300 MG capsule Take 1 capsule (300 mg total) by mouth 2 (two) times daily. Patient not taking: Reported on 11/03/2017 10/27/17   Heath Lark, MD    Inpatient Medications: Scheduled Meds:  Continuous Infusions:  PRN Meds:   Allergies:    Allergies  Allergen Reactions  . Bee Venom Anaphylaxis  . Other Nausea And Vomiting and Other (See Comments)    Pain medications cause severe vomiting. Tolerated slow IV morphine drip  . Amiodarone Nausea Only  . Prednisone Other (See Comments)    "Rapid Heart Beat"    Social History:   Social History   Socioeconomic History  . Marital status: Widowed    Spouse name: Not on file  . Number of children: 4  . Years of education: Not on file  . Highest education level: Not on file  Occupational History  . Occupation: Retired Nurse, learning disability estate  Social Needs  . Financial resource strain: Not on file  . Food insecurity:    Worry: Not on file    Inability: Not on file  . Transportation needs:    Medical: Not on file    Non-medical: Not on file  Tobacco Use  . Smoking status: Former Smoker    Packs/day: 1.00    Years: 32.00    Pack years: 32.00    Types: Cigarettes    Last attempt to quit: 02/03/1974    Years since quitting: 43.7  . Smokeless tobacco: Never Used  Substance and Sexual Activity  . Alcohol use: Yes    Alcohol/week: 5.0 standard drinks    Types: 5 Glasses of wine per week    Comment: 2017 WINE WITH DINNER   . Drug use: No  . Sexual activity: Never  Lifestyle  . Physical activity:    Days per week: Not on file    Minutes per session: Not on file  . Stress: Not on file  Relationships  .  Social connections:    Talks on phone: Not on file    Gets  together: Not on file    Attends religious service: Not on file    Active member of club or organization: Not on file    Attends meetings of clubs or organizations: Not on file    Relationship status: Not on file  . Intimate partner violence:    Fear of current or ex partner: Not on file    Emotionally abused: Not on file    Physically abused: Not on file    Forced sexual activity: Not on file  Other Topics Concern  . Not on file  Social History Narrative   Lives with family.    Family History:   Family History  Problem Relation Age of Onset  . Stroke Mother   . Heart attack Father   . Diabetes Father   . Hypertension Father   . Heart attack Brother   . Diabetes Brother   . Hypertension Brother   . Kidney failure Brother    Family Status:  Family Status  Relation Name Status  . Mother  Deceased at age 63  . Father  Deceased at age 67  . MGM  Deceased  . MGF  Deceased  . PGM  Deceased  . PGF  Deceased  . Brother  (Not Specified)  . Brother  (Not Specified)  . Brother  (Not Specified)  . Brother  (Not Specified)    ROS:  Please see the history of present illness.  All other ROS reviewed and negative.     Physical Exam/Data:   Vitals:   11/03/17 1500 11/03/17 1530 11/03/17 1545 11/03/17 1615  BP: 132/67 132/80 (!) 136/97 (!) 133/95  Pulse: 62 81 86 82  Resp: 16 18 19  (!) 21  Temp:      TempSrc:      SpO2: 93% 94% 93% 94%  Weight:      Height:       No intake or output data in the 24 hours ending 11/03/17 1626 Filed Weights   11/03/17 1254  Weight: 78.9 kg   Body mass index is 33.98 kg/m.   General: Elderly, obese, NAD Skin: Warm, dry, intact  Head: Normocephalic, atraumatic, clear, moist mucus membranes. Neck: Negative for carotid bruits. No JVD Lungs: Bilateral upper and lower lobe crackles. Breathing is unlabored. Cardiovascular: RRR with S1 S2. No murmurs, rubs, gallops, or LV heave  appreciated. Abdomen: Soft, non-tender, non-distended with normoactive bowel sounds. No obvious abdominal masses. MSK: Strength and tone appear normal for age. 5/5 in all extremities Extremities: No edema. No clubbing or cyanosis. DP/PT pulses 2+ bilaterally Neuro: Alert and oriented. No focal deficits. No facial asymmetry. MAE spontaneously. Psych: Responds to questions appropriately with normal affect.     EKG:  The EKG was personally reviewed and demonstrates: 11/03/2017 NSR and no acute ischemic changes Telemetry:  Telemetry was personally reviewed and demonstrates: 11/03/2017 NSR HR 80s to 90s  Relevant CV Studies:  ECHO: 10/14/2017: Study Conclusions  - Left ventricle: The cavity size was normal. Wall thickness was   increased in a pattern of moderate LVH. Septal-lateral   dyssynchrony. Systolic function was mildly to moderately reduced.   The estimated ejection fraction was in the range of 40% to 45%.   Diffuse hypokinesis. Features are consistent with a pseudonormal   left ventricular filling pattern, with concomitant abnormal   relaxation and increased filling pressure (grade 2 diastolic   dysfunction). - Aortic valve: Trileaflet; moderately calcified leaflets. There   was no stenosis. There was mild regurgitation. - Aorta: Mildly  dilated ascending aorta. Ascending aortic diameter:   39 mm (S). - Mitral valve: Moderately calcified annulus. There was mild   regurgitation. - Left atrium: The atrium was moderately dilated. - Right ventricle: The cavity size was normal. Pacer wire or   catheter noted in right ventricle. Systolic function was normal. - Right atrium: The atrium was mildly dilated. - Tricuspid valve: Peak RV-RA gradient (S): 33 mm Hg. - Pulmonary arteries: PA peak pressure: 36 mm Hg (S). - Inferior vena cava: The vessel was normal in size. The   respirophasic diameter changes were in the normal range (>= 50%),   consistent with normal central venous pressure. -  Pericardium, extracardiac: A trivial pericardial effusion was   identified.  Impressions:  - Nomral LV size with moderate LV hypertrophy. EF 40-45%, diffuse   hypokinesis with septal-lateral dyssynchrony consistent with LBBB   or RV pacing. Moderate diastolic dysfunction. Normal RV size and   systolic function. Mild MR, mild AI. Mild pulmonary hypertension.  CATH:   Laboratory Data:  Chemistry Recent Labs  Lab 11/03/17 1254  NA 137  K 3.2*  CL 99  CO2 30  GLUCOSE 240*  BUN 20  CREATININE 0.95  CALCIUM 8.5*  GFRNONAA 53*  GFRAA >60  ANIONGAP 8    Total Protein  Date Value Ref Range Status  10/27/2017 6.7 6.5 - 8.1 g/dL Final  10/08/2017 6.5 6.0 - 8.5 g/dL Final  12/14/2014 6.5 6.4 - 8.3 g/dL Final   Albumin  Date Value Ref Range Status  10/27/2017 2.9 (L) 3.5 - 5.0 g/dL Final  10/08/2017 3.9 3.5 - 4.7 g/dL Final  12/14/2014 3.4 (L) 3.5 - 5.0 g/dL Final   AST  Date Value Ref Range Status  10/27/2017 30 15 - 41 U/L Final  12/14/2014 29 5 - 34 U/L Final   ALT  Date Value Ref Range Status  10/27/2017 29 0 - 44 U/L Final  12/14/2014 20 0 - 55 U/L Final   Alkaline Phosphatase  Date Value Ref Range Status  10/27/2017 101 38 - 126 U/L Final  12/14/2014 103 40 - 150 U/L Final   Total Bilirubin  Date Value Ref Range Status  10/27/2017 0.8 0.3 - 1.2 mg/dL Final  12/14/2014 0.77 0.20 - 1.20 mg/dL Final   Bilirubin Total  Date Value Ref Range Status  10/08/2017 0.6 0.0 - 1.2 mg/dL Final   Hematology Recent Labs  Lab 10/28/17 1052 11/03/17 1254  WBC 7.5 6.2  RBC 4.00 3.59*  HGB 12.1 11.0*  HCT 39.0 34.9*  MCV 97.5 97.2  MCH 30.3 30.6  MCHC 31.0 31.5  RDW 15.5 14.8  PLT 403* 224   Cardiac EnzymesNo results for input(s): TROPONINI in the last 168 hours.  Recent Labs  Lab 11/03/17 1304  TROPIPOC 0.04    BNP Recent Labs  Lab 11/03/17 1254  BNP 373.6*    DDimer No results for input(s): DDIMER in the last 168 hours. TSH:  Lab Results    Component Value Date   TSH 2.360 07/11/2016   Lipids: Lab Results  Component Value Date   CHOL 132 10/08/2017   HDL 66 10/08/2017   LDLCALC 52 10/08/2017   TRIG 68 10/08/2017   CHOLHDL 2.0 10/08/2017   HgbA1c: Lab Results  Component Value Date   HGBA1C 6.0 (H) 05/17/2015    Radiology/Studies:  Dg Chest 2 View  Result Date: 11/03/2017 CLINICAL DATA:  Shortness of breath and chest pressure. EXAM: CHEST - 2 VIEW COMPARISON:  10/15/2017 FINDINGS: A  new right jugular Port-A-Cath terminates over the upper SVC. A left subclavian approach pacemaker remains in place with leads terminating over the right atrium and right ventricle. A leadless pacemaker is also again noted projecting over the right ventricle. The cardiac silhouette remains enlarged. Aortic atherosclerosis is noted. There is improved aeration of the lung bases with minimal atelectasis remaining as well as possibly trace bilateral pleural effusions. No overt pulmonary edema is evident. No pneumothorax is seen. No acute osseous abnormality is identified. IMPRESSION: Improved aeration of the lung bases with minimal residual atelectasis and possible trace pleural effusions. Electronically Signed   By: Logan Bores M.D.   On: 11/03/2017 13:44   Assessment and Plan:   1. Chest pain with history of complicated CAD with multiple vessel stenting: -Recent with a long history of CAD with most recent heart catheterization 07/14/2016 with difficult PCI to LCx requiring orbital arthrectomy.  She was last seen in consultation for recent hospitalization 10/2017 for preoperative clearance and anticoagulation assistance for ovarian tumor removal.  Her hospital course was complicated by life-threatening intra-abdominal bleed and therefore was taken off of all anticoagulation and antiplatelet therapies.  She presented to [facility] on 11/03/2017 with episode of chest pain which resolved with nitroglycerin per chart review has had intermittent chest pain in the  past associated with atrial fibrillation fluid volume overload -Not currently a cath candidate secondary to recent life-threatening intra-abdominal bleed -Troponin, initial i-STAT troponin mildly elevated at 0.04 -Currently chest pain-free -EKG with NSR paced rhythm with no acute ischemic changes, similar to recent prior tracings -BNP moderately elevated at 373 on presentation with known systolic and diastolic dysfunction per echocardiogram -Given her significant comorbid conditions will opt to treat medically   2.  Chronic combined systolic and diastolic CHF: -Echocardiogram on 10/14/2017 with LVEF of 40 to 45% with diffuse hypokinesis and G2 DD -Appears to be mildly fluid volume overloaded on exam with elevated BNP -Would recommend IV Lasix and monitor diuresis overnight  3.  Permanent atrial fibrillation s/p PPM: -Patient currently not on anticoagulation secondary to above spite elevated Chadvasc score -Currently rate controlled and in NSR with metoprolol tartrate 100 mg, diltiazem CD 240 mg in AM and 120 mg in PM -PPM followed by EP  4.  Recent intra-abdominal life-threatening bleed: -Patient recently evaluated by her service as preoperative clearance for ovarian tumor removal.  Hospital course complicated by acute life-threatening intra-abdominal bleed with recommendations to discontinue all anticoagulation and antiplatelet therapies.   5.  History of ovarian granulosa cell carcinoma with recent initiation of chemotherapy 1 week prior: -Stable, per primary team   For questions or updates, please contact [facility] Please consult [email or web address] for contact info under Cardiology/STEMI.   Signed, Kathyrn Drown NP-C [facility] Pager: [phone] 11/03/2017 4:26 PM  I have examined the patient and reviewed assessment and plan and discussed with patient.  Agree with above as stated.  Patient volume overloaded due to diastolic heart failure in the setting of chronic AFib. Will give  a dose of Lasix.    Given recent bleeding issues, would continue to hold anticoagulation and antiplatelet agents until ok given by oncology.    CP free.  THis is not an ACS.  No plan for ischemic w/u at this time. Hbg stable at 11.   Larae Grooms

## 2017-11-03 NOTE — ED Notes (Signed)
Lab to add on A1c 

## 2017-11-04 ENCOUNTER — Encounter (HOSPITAL_COMMUNITY): Payer: Self-pay | Admitting: Internal Medicine

## 2017-11-04 DIAGNOSIS — I10 Essential (primary) hypertension: Secondary | ICD-10-CM | POA: Diagnosis not present

## 2017-11-04 DIAGNOSIS — Z7901 Long term (current) use of anticoagulants: Secondary | ICD-10-CM | POA: Diagnosis not present

## 2017-11-04 DIAGNOSIS — I248 Other forms of acute ischemic heart disease: Secondary | ICD-10-CM | POA: Diagnosis present

## 2017-11-04 DIAGNOSIS — Z87891 Personal history of nicotine dependence: Secondary | ICD-10-CM | POA: Diagnosis not present

## 2017-11-04 DIAGNOSIS — R739 Hyperglycemia, unspecified: Secondary | ICD-10-CM | POA: Diagnosis not present

## 2017-11-04 DIAGNOSIS — N183 Chronic kidney disease, stage 3 (moderate): Secondary | ICD-10-CM | POA: Diagnosis present

## 2017-11-04 DIAGNOSIS — Z9989 Dependence on other enabling machines and devices: Secondary | ICD-10-CM | POA: Diagnosis not present

## 2017-11-04 DIAGNOSIS — I2510 Atherosclerotic heart disease of native coronary artery without angina pectoris: Secondary | ICD-10-CM | POA: Diagnosis present

## 2017-11-04 DIAGNOSIS — I252 Old myocardial infarction: Secondary | ICD-10-CM | POA: Diagnosis not present

## 2017-11-04 DIAGNOSIS — Z955 Presence of coronary angioplasty implant and graft: Secondary | ICD-10-CM | POA: Diagnosis not present

## 2017-11-04 DIAGNOSIS — Z79899 Other long term (current) drug therapy: Secondary | ICD-10-CM | POA: Diagnosis not present

## 2017-11-04 DIAGNOSIS — D630 Anemia in neoplastic disease: Secondary | ICD-10-CM | POA: Diagnosis present

## 2017-11-04 DIAGNOSIS — I5043 Acute on chronic combined systolic (congestive) and diastolic (congestive) heart failure: Secondary | ICD-10-CM | POA: Diagnosis present

## 2017-11-04 DIAGNOSIS — D638 Anemia in other chronic diseases classified elsewhere: Secondary | ICD-10-CM | POA: Diagnosis present

## 2017-11-04 DIAGNOSIS — E785 Hyperlipidemia, unspecified: Secondary | ICD-10-CM | POA: Diagnosis present

## 2017-11-04 DIAGNOSIS — I25118 Atherosclerotic heart disease of native coronary artery with other forms of angina pectoris: Secondary | ICD-10-CM | POA: Diagnosis not present

## 2017-11-04 DIAGNOSIS — I5040 Unspecified combined systolic (congestive) and diastolic (congestive) heart failure: Secondary | ICD-10-CM | POA: Diagnosis not present

## 2017-11-04 DIAGNOSIS — Z66 Do not resuscitate: Secondary | ICD-10-CM | POA: Diagnosis present

## 2017-11-04 DIAGNOSIS — I8390 Asymptomatic varicose veins of unspecified lower extremity: Secondary | ICD-10-CM | POA: Diagnosis present

## 2017-11-04 DIAGNOSIS — I739 Peripheral vascular disease, unspecified: Secondary | ICD-10-CM | POA: Diagnosis present

## 2017-11-04 DIAGNOSIS — C569 Malignant neoplasm of unspecified ovary: Secondary | ICD-10-CM | POA: Diagnosis not present

## 2017-11-04 DIAGNOSIS — R0789 Other chest pain: Secondary | ICD-10-CM

## 2017-11-04 DIAGNOSIS — E876 Hypokalemia: Secondary | ICD-10-CM | POA: Diagnosis not present

## 2017-11-04 DIAGNOSIS — I4821 Permanent atrial fibrillation: Secondary | ICD-10-CM | POA: Diagnosis not present

## 2017-11-04 DIAGNOSIS — D6481 Anemia due to antineoplastic chemotherapy: Secondary | ICD-10-CM | POA: Diagnosis present

## 2017-11-04 DIAGNOSIS — I130 Hypertensive heart and chronic kidney disease with heart failure and stage 1 through stage 4 chronic kidney disease, or unspecified chronic kidney disease: Secondary | ICD-10-CM | POA: Diagnosis present

## 2017-11-04 DIAGNOSIS — G4733 Obstructive sleep apnea (adult) (pediatric): Secondary | ICD-10-CM | POA: Diagnosis not present

## 2017-11-04 DIAGNOSIS — Z950 Presence of cardiac pacemaker: Secondary | ICD-10-CM | POA: Diagnosis not present

## 2017-11-04 LAB — BASIC METABOLIC PANEL
ANION GAP: 9 (ref 5–15)
BUN: 17 mg/dL (ref 8–23)
CALCIUM: 8 mg/dL — AB (ref 8.9–10.3)
CO2: 29 mmol/L (ref 22–32)
Chloride: 100 mmol/L (ref 98–111)
Creatinine, Ser: 0.87 mg/dL (ref 0.44–1.00)
GFR calc Af Amer: >60 mL/min (ref >60)
GFR, EST NON AFRICAN AMERICAN: 59 mL/min — AB (ref >60)
GLUCOSE: 94 mg/dL (ref 70–99)
Potassium: 3.2 mmol/L — ABNORMAL LOW (ref 3.5–5.1)
SODIUM: 138 mmol/L (ref 135–145)

## 2017-11-04 LAB — TROPONIN I: TROPONIN I: 0.09 ng/mL — AB (ref <0.03)

## 2017-11-04 MED ORDER — POTASSIUM CHLORIDE CRYS ER 20 MEQ PO TBCR
40.0000 meq | EXTENDED_RELEASE_TABLET | Freq: Once | ORAL | Status: AC
  Administered 2017-11-04: 40 meq via ORAL
  Filled 2017-11-04: qty 2

## 2017-11-04 MED ORDER — POTASSIUM CHLORIDE 20 MEQ PO PACK
40.0000 meq | PACK | Freq: Once | ORAL | Status: DC
  Filled 2017-11-04: qty 2

## 2017-11-04 MED ORDER — ONDANSETRON HCL 4 MG/2ML IJ SOLN
4.0000 mg | Freq: Four times a day (QID) | INTRAMUSCULAR | Status: DC | PRN
  Administered 2017-11-04: 4 mg via INTRAVENOUS
  Filled 2017-11-04: qty 2

## 2017-11-04 NOTE — Progress Notes (Addendum)
Progress Note  Patient Name: Patty [name] Date of Encounter: 11/04/2017  Primary Cardiologist: No primary care provider on file.  Subjective   Feeling better this morning with her breathing. No chest pain.   Inpatient Medications    Scheduled Meds: . atorvastatin  80 mg Oral BH-q7a  . calcium-vitamin D  1 tablet Oral QPM  . diltiazem  120 mg Oral QHS  . diltiazem  240 mg Oral q morning - 10a  . docusate sodium  100 mg Oral BID  . furosemide  40 mg Intravenous Q12H  . isosorbide mononitrate  60 mg Oral Daily  . metoprolol tartrate  100 mg Oral BID  . multivitamin with minerals  1 tablet Oral Daily  . potassium chloride  40 mEq Oral Once  . potassium chloride SA  20 mEq Oral QHS  . sodium chloride flush  3 mL Intravenous Q12H   Continuous Infusions: . sodium chloride     PRN Meds: sodium chloride, acetaminophen, nitroGLYCERIN, ondansetron (ZOFRAN) IV, sodium chloride flush   Vital Signs    Vitals:   11/03/17 2011 11/03/17 2212 11/04/17 0042 11/04/17 0550  BP: 137/77 (!) 141/80 122/65 (!) 154/67  Pulse: 85 74 77 74  Resp: 18  18 18   Temp: 98 F (36.7 C)  98.7 F (37.1 C) 97.8 F (36.6 C)  TempSrc: Oral  Oral Oral  SpO2: 91%  96% 97%  Weight:    77.4 kg  Height:        Intake/Output Summary (Last 24 hours) at 11/04/2017 1015 Last data filed at 11/04/2017 0900 Gross per 24 hour  Intake 840 ml  Output 1850 ml  Net -1010 ml   Filed Weights   11/03/17 1254 11/03/17 2010 11/04/17 0550  Weight: 78.9 kg 77.3 kg 77.4 kg    Telemetry    AFib rate controlled - Personally Reviewed  ECG    N/a - Personally Reviewed  Physical Exam   General: Well developed, well nourished, female appearing in no acute distress. Head: Normocephalic, atraumatic.  Neck: Supple, + JVD. Lungs:  Resp regular and unlabored, CTA. Heart: Irreg Irreg, S1, S2, systolic murmur; no rub. Abdomen: Soft, non-tender, non-distended with normoactive bowel sounds.  Extremities: No  clubbing, cyanosis, edema. Distal pedal pulses are 2+ bilaterally. Neuro: Alert and oriented X 3. Moves all extremities spontaneously. Psych: Normal affect.  Labs    Chemistry Recent Labs  Lab 11/03/17 1254 11/04/17 0422  NA 137 138  K 3.2* 3.2*  CL 99 100  CO2 30 29  GLUCOSE 240* 94  BUN 20 17  CREATININE 0.95 0.87  CALCIUM 8.5* 8.0*  GFRNONAA 53* 59*  GFRAA >60 >60  ANIONGAP 8 9     Hematology Recent Labs  Lab 10/28/17 1052 11/03/17 1254  WBC 7.5 6.2  RBC 4.00 3.59*  HGB 12.1 11.0*  HCT 39.0 34.9*  MCV 97.5 97.2  MCH 30.3 30.6  MCHC 31.0 31.5  RDW 15.5 14.8  PLT 403* 224    Cardiac EnzymesNo results for input(s): TROPONINI in the last 168 hours.  Recent Labs  Lab 11/03/17 1304  TROPIPOC 0.04     BNP Recent Labs  Lab 11/03/17 1254  BNP 373.6*     DDimer No results for input(s): DDIMER in the last 168 hours.    Radiology    Dg Chest 2 View  Result Date: 11/03/2017 CLINICAL DATA:  Shortness of breath and chest pressure. EXAM: CHEST - 2 VIEW COMPARISON:  10/15/2017 FINDINGS: A new right  jugular Port-A-Cath terminates over the upper SVC. A left subclavian approach pacemaker remains in place with leads terminating over the right atrium and right ventricle. A leadless pacemaker is also again noted projecting over the right ventricle. The cardiac silhouette remains enlarged. Aortic atherosclerosis is noted. There is improved aeration of the lung bases with minimal atelectasis remaining as well as possibly trace bilateral pleural effusions. No overt pulmonary edema is evident. No pneumothorax is seen. No acute osseous abnormality is identified. IMPRESSION: Improved aeration of the lung bases with minimal residual atelectasis and possible trace pleural effusions. Electronically Signed   By: Logan Bores M.D.   On: 11/03/2017 13:44    Cardiac Studies   N/a   Patient Profile     84 y.o. 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   Assessment & Plan    1. Chest pain with history of complicated CAD with multiple vessel stenting: -Recent with a long history of CAD with most recent heart catheterization 07/14/2016 with difficult PCI to LCx requiring orbital arthrectomy.  She was last seen in consultation for recent hospitalization 10/2017 for preoperative clearance and anticoagulation assistance for ovarian tumor removal.  Her hospital course was complicated by life-threatening intra-abdominal bleed and therefore was taken off of all anticoagulation and antiplatelet therapies.  She presented to [facility] on [facility]/03/2017 with episode of chest pain which resolved with nitroglycerin per chart review has had intermittent chest pain in the past associated with atrial fibrillation fluid volume overload - No further chest pain. Only one Trop noted at 0.04. Would continue to cycle. Plan to treat medically given hx of bleeding and other comorbid conditions.    2.  Chronic combined systolic and diastolic CHF: -Echocardiogram on 10/14/2017 with LVEF of 40 to 45% with diffuse hypokinesis and G2 DD -1.8L UOP yesterday -Breathing is better today but still labored at rest. Would continue with IV lasix today and follow up in the am. Renal function stable.   3.  Permanent atrial fibrillation s/p PPM: -Patient currently not on anticoagulation secondary to above spite elevated Chadvasc score -Currently rate controlled but now in afib today  -continue with metoprolol tartrate 100 mg, diltiazem CD 240 mg in AM and 120 mg in PM -PPM followed by EP  4.  Recent intra-abdominal life-threatening bleed: -Patient recently evaluated by her service as preoperative clearance for ovarian tumor removal.  Hospital course complicated by  acute life-threatening intra-abdominal bleed with recommendations to discontinue all anticoagulation and antiplatelet therapies.   5.  History of ovarian granulosa cell carcinoma with recent initiation of chemotherapy 1 week prior: -Stable, per primary team    Signed, Reino Bellis, NP  11/04/2017, 10:15 AM  Pager # [phone]   I have examined the patient and reviewed assessment and plan and discussed with patient.  Agree with above as stated.  COntinue diuresis.  Renal function stable. Limit salt intake.  Drink water wwhen thirsty.  SHe had been taking in more volume due to her chemotherapy.  Given her diastolic heart failure and AFib, she doe not toelrate extra volume well.   Will check with Dr. Alvy Bimler and Dr. Denman George as to when we could try starting back anticoagulation.    Larae Grooms   For questions or  updates, please contact [place] Please consult [email or web address] for contact info under Cardiology/STEMI.

## 2017-11-04 NOTE — Progress Notes (Signed)
[facility] PROGRESS NOTE  Leyli Kevorkian Frieson RKY:[id number] DOB: 01/06/1931 DOA: 11/03/2017 PCP: [name], MD  Assessment/Plan:  Chest pain. Resolved this am. Atypical and typical features. Hx complicated CAD with multiple stenting. Evaluated by cardiology who opine likely related to fluid overload and recommended diureses. ekg with no acute changes. bnp 347. Initial troponin 0.04 chest xray with improved aeration of lung bases with minimal residual atelectasis and possible trace pleural effusions -cycle troponin -serial ekg's -no asa as hx gi bleed -continue lipitor -continue imdur -appreciate cardiology assistance  Combined systolic and diastolic heart failure, NYHA class 3 ([place]): echo 10/2017 EF 40% with diffuse hypokinesis and grade 2 DD. IV lasix started in ED. Has voided 1.8L. States breathing better last evening than this am but remains quite sob at rest on exam.  -continue IV lasix  -daily weight -monitor intake and output -bmet in am   Persistent atrial fibrillation. Rate controlled. Not on anticoagulation due to hx gi bleed. chadvasc score 5. Home meds include metroprolol and cardizem. Also with PPM -continue home meds -monitor -appreciated cardiology input  Hypokalemia: potassium 3.2 this am. Likely related to IV lasix -repleted -recheck   CAD (coronary artery disease) see above      OSA on CPAP : refused cpap last night    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[place]) OP follow up     Intraperitoneal hemorrhage: after surgery. No anti-coags   Code Status: dnr Family Communication: none present Disposition Plan: home when ready   Consultants:  cardiology  Procedures:    Antibiotics:  none  HPI/Subjective: Lying in bed watching TV. Reports breathing better last evening but this am feels sob again. No cp. Complained nausea.  Patient with past medical hx for  coronary artery disease status post stenting, ovarian cancer currently undergoing  chemo, recent abdominal bleed while on anticoagulation presented to ed on 10/1 with chest pain shortness of breath.   patient had left-sided chest pain associated shortness of breath.  She also had  short-lived palpitations.  She took 3 nitro and by the time EMS arrived her pain resolved.  She has had episodes like this in the past.  She reported she believes anxiety has some component as well.  Patient had a intra-abdominal bleed earlier this month however she has been taken off of all anticoagulants and aspirin.  She has been compliant with her meds.  Patient has ovarian cancer her last chemo was 1 week prior to admission.  She follows with Dr. Alvy Bimler .   Objective: Vitals:   11/04/17 0550 11/04/17 1319  BP: (!) 154/67 126/63  Pulse: 74 74  Resp: 18 20  Temp: 97.8 F (36.6 C) 98 F (36.7 C)  SpO2: 97% 92%    Intake/Output Summary (Last 24 hours) at 11/04/2017 1408 Last data filed at 11/04/2017 1321 Gross per 24 hour  Intake 1080 ml  Output 2750 ml  Net -1670 ml   Filed Weights   11/03/17 1254 11/03/17 2010 11/04/17 0550  Weight: 78.9 kg 77.3 kg 77.4 kg    Exam:   General:  Lying in bed awake mild distress  Cardiovascular: irregularly irregular. No MGR no LE edema  Respiratory: mild to moderate increased work of breathing with conversation. Using abdominal accessory muscles. BS somewhat distant but clear no wheeze  Abdomen: obese soft +BS non-tender to palpation  Musculoskeletal: joints without swelling/erythema   Data Reviewed: Basic Metabolic Panel: Recent Labs  Lab 11/03/17 1254 11/04/17 0422  NA 137 138  K  3.2* 3.2*  CL 99 100  CO2 30 29  GLUCOSE 240* 94  BUN 20 17  CREATININE 0.95 0.87  CALCIUM 8.5* 8.0*   Liver Function Tests: No results for input(s): AST, ALT, ALKPHOS, BILITOT, PROT, ALBUMIN in the last 168 hours. No results for input(s): LIPASE, AMYLASE in the last 168 hours. No results for input(s): AMMONIA in the last 168 hours. CBC: Recent Labs   Lab 11/03/17 1254  WBC 6.2  HGB 11.0*  HCT 34.9*  MCV 97.2  PLT 224   Cardiac Enzymes: No results for input(s): CKTOTAL, CKMB, CKMBINDEX, TROPONINI in the last 168 hours. BNP (last 3 results) Recent Labs    06/29/17 1154 11/03/17 1254  BNP 288.2* 373.6*    ProBNP (last 3 results) No results for input(s): PROBNP in the last 8760 hours.  CBG: No results for input(s): GLUCAP in the last 168 hours.  No results found for this or any previous visit (from the past 240 hour(s)).   Studies: Dg Chest 2 View  Result Date: 11/03/2017 CLINICAL DATA:  Shortness of breath and chest pressure. EXAM: CHEST - 2 VIEW COMPARISON:  10/15/2017 FINDINGS: A new right jugular Port-A-Cath terminates over the upper SVC. A left subclavian approach pacemaker remains in place with leads terminating over the right atrium and right ventricle. A leadless pacemaker is also again noted projecting over the right ventricle. The cardiac silhouette remains enlarged. Aortic atherosclerosis is noted. There is improved aeration of the lung bases with minimal atelectasis remaining as well as possibly trace bilateral pleural effusions. No overt pulmonary edema is evident. No pneumothorax is seen. No acute osseous abnormality is identified. IMPRESSION: Improved aeration of the lung bases with minimal residual atelectasis and possible trace pleural effusions. Electronically Signed   By: Logan Bores M.D.   On: 11/03/2017 13:44    Scheduled Meds: . atorvastatin  80 mg Oral BH-q7a  . calcium-vitamin D  1 tablet Oral QPM  . diltiazem  120 mg Oral QHS  . diltiazem  240 mg Oral q morning - 10a  . docusate sodium  100 mg Oral BID  . furosemide  40 mg Intravenous Q12H  . isosorbide mononitrate  60 mg Oral Daily  . metoprolol tartrate  100 mg Oral BID  . multivitamin with minerals  1 tablet Oral Daily  . potassium chloride SA  20 mEq Oral QHS  . potassium chloride  40 mEq Oral Once  . sodium chloride flush  3 mL Intravenous  Q12H   Continuous Infusions: . sodium chloride      Principal Problem:   Chest pain Active Problems:   CAD (coronary artery disease)   Persistent atrial fibrillation   CHF (congestive heart failure), NYHA class II, acute, combined ([name])   Essential hypertension   Combined systolic and diastolic heart failure, NYHA class 3 ([name])   Hypokalemia   OSA on CPAP   Intraperitoneal hemorrhage   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[place])   Hyperglycemia    Time spent: 45 minutes    [facility]  [facility]  If 7PM-7AM, please contact night-coverage at [email or web address], password [facility] 11/04/2017, 2:08 PM  LOS: 0 days

## 2017-11-04 NOTE — Consult Note (Signed)
   [facility] CM Inpatient Consult   11/04/2017  Patty [name] [birth year] [id number]  Patient assessed for readmission and high risk for potential [place] Management services. Patient is in the [place] of the [place] Management services under patient's Medicare plan.  Met with the patient at the bedside regarding readmission and community follow up needs.  Patient state her primary care provider is Dr. [name] at [facility].  This practice provides the follow up calls and appointments.  Patient states she appreciates the follow up but feels she has no current needs for follow up.  She has good transportation, good close follow up, and was receiving chemotherapy and aware of the HF risk and side effects. Patient states she has the 24 hour nurse line magnet at home and the information.  She pleasantly declined services at this time.  Encouraged her to call for any changes or support needed from [facility].   For questions contact:   Natividad Brood, RN BSN [facility] Liaison  [phone] business mobile phone Toll free office [phone]

## 2017-11-05 DIAGNOSIS — I10 Essential (primary) hypertension: Secondary | ICD-10-CM

## 2017-11-05 LAB — CBC
HEMATOCRIT: 35.9 % — AB (ref 36.0–46.0)
Hemoglobin: 11.4 g/dL — ABNORMAL LOW (ref 12.0–15.0)
MCH: 30.5 pg (ref 26.0–34.0)
MCHC: 31.8 g/dL (ref 30.0–36.0)
MCV: 96 fL (ref 78.0–100.0)
Platelets: 214 10*3/uL (ref 150–400)
RBC: 3.74 MIL/uL — ABNORMAL LOW (ref 3.87–5.11)
RDW: 14.8 % (ref 11.5–15.5)
WBC: 6.2 10*3/uL (ref 4.0–10.5)

## 2017-11-05 LAB — BASIC METABOLIC PANEL
Anion gap: 9 (ref 5–15)
BUN: 15 mg/dL (ref 8–23)
CO2: 30 mmol/L (ref 22–32)
CREATININE: 0.9 mg/dL (ref 0.44–1.00)
Calcium: 8.4 mg/dL — ABNORMAL LOW (ref 8.9–10.3)
Chloride: 101 mmol/L (ref 98–111)
GFR calc Af Amer: >60 mL/min (ref >60)
GFR calc non Af Amer: 57 mL/min — ABNORMAL LOW (ref >60)
GLUCOSE: 100 mg/dL — AB (ref 70–99)
Potassium: 3.5 mmol/L (ref 3.5–5.1)
Sodium: 140 mmol/L (ref 135–145)

## 2017-11-05 LAB — HEMOGLOBIN A1C
HEMOGLOBIN A1C: 5.8 % — AB (ref 4.8–5.6)
MEAN PLASMA GLUCOSE: 120 mg/dL

## 2017-11-05 LAB — TROPONIN I: Troponin I: 0.07 ng/mL (ref <0.03)

## 2017-11-05 LAB — MAGNESIUM: Magnesium: 1.8 mg/dL (ref 1.7–2.4)

## 2017-11-05 MED ORDER — POTASSIUM CHLORIDE CRYS ER 20 MEQ PO TBCR
20.0000 meq | EXTENDED_RELEASE_TABLET | Freq: Once | ORAL | Status: AC
  Administered 2017-11-05: 20 meq via ORAL
  Filled 2017-11-05: qty 1

## 2017-11-05 MED ORDER — METOPROLOL TARTRATE 100 MG PO TABS: 100.0000 mg | ORAL_TABLET | Freq: Two times a day (BID) | ORAL | Status: DC

## 2017-11-05 NOTE — Progress Notes (Signed)
Pt discharge instructions reviewed with pt. Pt verbalizes understanding and states she has no questions. Pt belongings with pt. Pt is not in distress. Pt discharged via wheelchair. Pt's daughter is driving her home.

## 2017-11-05 NOTE — Plan of Care (Signed)
  Problem: Health Behavior/Discharge Planning: Goal: Ability to manage health-related needs will improve Outcome: Progressing   Problem: Clinical Measurements: Goal: Ability to maintain clinical measurements within normal limits will improve Outcome: Progressing   Problem: Clinical Measurements: Goal: Respiratory complications will improve Outcome: Progressing   

## 2017-11-05 NOTE — Discharge Summary (Addendum)
Physician Discharge Summary  Patty [name] HWE:[id number] DOB: 08-25-1930  PCP: [name], MD  Admit date: 11/03/2017 Discharge date: 11/05/2017  Recommendations for Outpatient Follow-up:  1. Dr. [name], PCP in 1 week with repeat labs (CBC & BMP). 2. Dr. Casandra Doffing, Cardiology in 2 weeks.  I have sent in in basket message to their card master to arrange this follow-up.  Home Health: None Equipment/Devices: None  Discharge Condition: Improved and stable CODE STATUS: DNR Diet recommendation: Heart healthy diet.  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 fib not on anticoagulation due to history of life-threatening abdominal bleed during recent hospitalization during which time Plavix and Coumadin were discontinued, CKD stage III, OSA on CPAP, chronic combined systolic and diastolic CHF, CAD status post multiple stents, recent initiation of chemotherapy for ovarian granulosa cell carcinoma presented to ED due to dyspnea, transient palpitations and chest pain that relieved with sublingual NTG x2.  She was admitted for further evaluation and management.  Cardiology was consulted.  Assessment and plan:  1. Chest pain in a patient with history of complicated CAD with multiple vessel stenting: Cardiology was consulted and assisted with evaluation and management.  As per cardiology, most recent heart catheterization 07/14/2016 with difficult PCI to LCx requiring orbital  atherectomy.  During recent hospitalization 10/2017, she was seen by cardiology for preop clearance and anticoagulation assistance for ovarian tumor removal.  That hospital course was complicated by life-threatening intra-abdominal bleed and therefore she was taken off of all anticoagulation and antiplatelet therapies.  No recurrence of chest pain since admission.  Troponin minimally elevated with flat trend.  Not ACS pattern.  Suspect demand ischemia.  Continue statins, Imdur and beta-blockers.  Cardiology cleared for discharge. 2. Acute on chronic combined systolic and diastolic CHF: TTE 8/93/8101: LVEF 40-45%, diffuse hypokinesis and grade 2 diastolic dysfunction.  Treated with IV Lasix.  -1.9 L since admission.  Weight also down by about 4 pounds.  Clinically now euvolemic.  Likely precipitated by excess water intake.  Cardiology has seen her and cleared her for discharge home on prior dose of home Lasix.  She is also on PRN Aldactone for leg edema.  Patient has been counseled regarding avoiding drinking excessive water and drink only when thirsty rather than drinking to meet a certain target.  She verbalizes understanding.  Patient reports that she is in cardiac rehab but was unable to participate almost the entire summer due to history of shingles.  She has advanced home care coming to her home to assist. 3. Permanent atrial fibrillation status post PPM: Currently not on anticoagulation secondary to recent life-threatening intra-abdominal bleed.  Continue prior home dose of Cardizem and metoprolol.  I discussed with Dr. Irish Lack who has in turn communicated with patient's medical and GYN oncologist who have apparently cleared her for resuming anticoagulation but he also wishes to speak with patient's surgeon prior to deciding on anticoagulation.  He will follow this up as outpatient. 4. Ovarian granulosa cell carcinoma:  Recently initiated chemotherapy a week prior.  Outpatient follow-up with  oncology. 5. Hypokalemia: Replaced today.  Magnesium 1.8.  Continue prior home dose of potassium supplements.  Follow BMP as outpatient. 6. Normocytic anemia: Suspect multifactorial due to malignancy, chemotherapy and anemia of chronic disease.  Outpatient follow-up with oncology.  Stable. 7. Hyperglycemia: A1c 5.8. 8. Hyperlipidemia: Continue statins. 9. Essential hypertension: Controlled on regimen below. 10. OSA on CPAP: Continue.    Consultations:  Cardiology  Procedures:  None   Discharge Instructions  Discharge Instructions    (HEART FAILURE PATIENTS) Call MD:  Anytime you have any of the following symptoms: 1) 3 pound weight gain in 24 hours or 5 pounds in 1 week 2) shortness of breath, with or without a dry hacking cough 3) swelling in the hands, feet or stomach 4) if you have to sleep on extra pillows at night in order to breathe.   Complete by:  As directed    Call MD for:  difficulty breathing, headache or visual disturbances   Complete by:  As directed    Call MD for:  extreme fatigue   Complete by:  As directed    Call MD for:  persistant dizziness or light-headedness   Complete by:  As directed    Call MD for:  severe uncontrolled pain   Complete by:  As directed    Diet - low sodium heart healthy   Complete by:  As directed    Increase activity slowly   Complete by:  As directed        Medication List    STOP taking these medications   gabapentin 300 MG capsule Commonly known as:  NEURONTIN     TAKE these medications   acetaminophen 500 MG tablet Commonly known as:  TYLENOL Take 500 mg by mouth at bedtime as needed for mild pain.   atorvastatin 80 MG tablet Commonly known as:  LIPITOR TAKE 1 TABLET BY MOUTH EVERY MORNING What changed:  when to take this   beta carotene w/minerals tablet Take 1 tablet by mouth 2 (two) times daily.   CALTRATE 600+D 600-800 MG-UNIT Tabs Generic drug:  Calcium Carb-Cholecalciferol Take 1 tablet by mouth every  evening.   Coenzyme Q-10 100 MG capsule Take 100-200 mg by mouth daily.   diltiazem 120 MG 24 hr capsule Commonly known as:  CARDIZEM CD TAKE ONE CAPSULE BY MOUTH AT BEDTIME What changed:  Another medication with the same name was changed. Make sure you understand how and when to take each.   diltiazem 240 MG 24 hr capsule Commonly known as:  CARDIZEM CD TAKE ONE CAPSULE BY MOUTH DAILY WITH BREAKFAST What changed:  See the new instructions.   docusate sodium 100 MG capsule Commonly known as:  COLACE Take 100 mg by mouth 2 (two) times daily.   Fish Oil 1200 MG Caps Take 1,200 mg by mouth daily.   furosemide 40 MG tablet Commonly known as:  LASIX Take 80 mg by mouth daily.   HYDROmorphone 2 MG tablet Commonly known as:  DILAUDID Take 1 tablet (2 mg total) by mouth every 4 (four) hours as needed for severe pain.   isosorbide mononitrate 60 MG 24 hr tablet Commonly known as:  IMDUR TAKE 1 TABLET BY MOUTH DAILY   KLOR-CON M20 20 MEQ tablet Generic drug:  potassium chloride SA TAKE 1 TABLET BY MOUTH EVERY DAY What changed:    how much to take  when to take this   lidocaine-prilocaine cream  Commonly known as:  EMLA Apply 1 application topically as needed. What changed:  reasons to take this   metoprolol tartrate 100 MG tablet Commonly known as:  LOPRESSOR Take 1 tablet (100 mg total) by mouth 2 (two) times daily.   multivitamin capsule Take 1 capsule by mouth daily.   nitroGLYCERIN 0.4 MG SL tablet Commonly known as:  NITROSTAT PLACE 1 TABLET (0.4 MG TOTAL) UNDER THE TONGUE EVERY 5 (FIVE) MINUTES AS NEEDED. FOR CHEST PAIN What changed:  See the new instructions.   NON FORMULARY Place 1 each into the nose at bedtime. CPAP setting 3.5   ondansetron 8 MG tablet Commonly known as:  ZOFRAN Take 1 tablet (8 mg total) by mouth every 8 (eight) hours as needed for nausea.   prochlorperazine 10 MG tablet Commonly known as:  COMPAZINE Take 1 tablet (10 mg total) by  mouth every 6 (six) hours as needed for nausea or vomiting.   spironolactone 25 MG tablet Commonly known as:  ALDACTONE Take 25 mg by mouth at bedtime as needed (swelling in ankles).      Follow-up Information    [name], MD. Go on 11/16/2017.   Specialty:  Internal Medicine Why:  To be seen with repeat labs (CBC & BMP).@10 :45am Contact information: [address] [place] [address] [phone]        [name], MD. Schedule an appointment as soon as possible for a visit in 2 week(s).   Specialties:  Cardiology, Radiology, Interventional Cardiology Contact information: [address] [place] [address] [phone]          Allergies  Allergen Reactions  . Bee Venom Anaphylaxis  . Other Nausea And Vomiting and Other (See Comments)    Pain medications cause severe vomiting. Tolerated slow IV morphine drip  . Amiodarone Nausea Only  . Prednisone Other (See Comments)    "Rapid Heart Beat"      Procedures/Studies: Dg Chest 2 View  Result Date: 11/03/2017 CLINICAL DATA:  Shortness of breath and chest pressure. EXAM: CHEST - 2 VIEW COMPARISON:  10/15/2017 FINDINGS: A new right jugular Port-A-Cath terminates over the upper SVC. A left subclavian approach pacemaker remains in place with leads terminating over the right atrium and right ventricle. A leadless pacemaker is also again noted projecting over the right ventricle. The cardiac silhouette remains enlarged. Aortic atherosclerosis is noted. There is improved aeration of the lung bases with minimal atelectasis remaining as well as possibly trace bilateral pleural effusions. No overt pulmonary edema is evident. No pneumothorax is seen. No acute osseous abnormality is identified. IMPRESSION: Improved aeration of the lung bases with minimal residual atelectasis and possible trace pleural effusions. Electronically Signed   By: Logan Bores M.D.   On: 11/03/2017 13:44       Subjective: "Feels fine".  Dyspnea resolved and breathing is back to baseline.  No chest pain since admission.  No palpitations, dizziness or lightheadedness.  As per RN, ambulated in hall on room air and was not hypoxic.  Discharge Exam:  Vitals:   11/05/17 0021 11/05/17 0453 11/05/17 0737 11/05/17 1210  BP: (!) 134/56 (!) 156/67 (!) 150/77 119/63  Pulse: 73 75 75 76  Resp: 16 16 20    Temp: 98.4 F (36.9 C) 98.6 F (37 C) 97.8 F (36.6 C) 98.5 F (36.9 C)  TempSrc: Oral Oral Oral Oral  SpO2: 98% 96% 99% 95%  Weight:      Height:  General: Pleasant elderly female, moderately built and obese sitting up comfortably in chair this morning. Cardiovascular: S1 & S2 heard, RRR, S1/S2 +. No murmurs, rubs, gallops or clicks. No JVD or pedal edema.  Telemetry personally reviewed: SR/intermittent V paced rhythm. Respiratory: Clear to auscultation without wheezing, rhonchi or crackles. No increased work of breathing.  Has Port-A-Cath right upper anterior chest. Abdominal:  Non distended, non tender & soft. No organomegaly or masses appreciated. Normal bowel sounds heard. CNS: Alert and oriented. No focal deficits. Extremities: no edema, no cyanosis    The results of significant diagnostics from this hospitalization (including imaging, microbiology, ancillary and laboratory) are listed below for reference.      Labs: CBC: Recent Labs  Lab 11/03/17 1254 11/05/17 0615  WBC 6.2 6.2  HGB 11.0* 11.4*  HCT 34.9* 35.9*  MCV 97.2 96.0  PLT 224 403   Basic Metabolic Panel: Recent Labs  Lab 11/03/17 1254 11/04/17 0422 11/05/17 0615  NA 137 138 140  K 3.2* 3.2* 3.5  CL 99 100 101  CO2 30 29 30   GLUCOSE 240* 94 100*  BUN 20 17 15   CREATININE 0.95 0.87 0.90  CALCIUM 8.5* 8.0* 8.4*  MG  --   --  1.8    BNP (last 3 results) Recent Labs    06/29/17 1154 11/03/17 1254  BNP 288.2* 373.6*   Cardiac Enzymes: Recent Labs  Lab 11/04/17 1420 11/05/17 0615   TROPONINI 0.09* 0.07*   Hgb A1c Recent Labs    11/03/17 2021  HGBA1C 5.8*       Time coordinating discharge: 40 minutes  SIGNED:  Vernell Leep, MD, FACP, [facility]. [facility] Pager [phone]  If 7PM-7AM, please contact night-coverage [email or web address] Password [email or web address] 11/05/2017, 12:31 PM

## 2017-11-05 NOTE — Progress Notes (Addendum)
Progress Note  Patient Name: Patty [name] Date of Encounter: 11/05/2017  Primary Cardiologist: Larae Grooms, MD   Subjective   Feeling well. No chest pain, sob or palpitations.   Inpatient Medications    Scheduled Meds: . atorvastatin  80 mg Oral BH-[id number]  . calcium-vitamin D  1 tablet Oral QPM  . diltiazem  120 mg Oral QHS  . diltiazem  240 mg Oral q morning - 10a  . docusate sodium  100 mg Oral BID  . furosemide  40 mg Intravenous Q12H  . isosorbide mononitrate  60 mg Oral Daily  . metoprolol tartrate  100 mg Oral BID  . multivitamin with minerals  1 tablet Oral Daily  . potassium chloride SA  20 mEq Oral QHS  . sodium chloride flush  3 mL Intravenous Q12H   Continuous Infusions: . sodium chloride     PRN Meds: sodium chloride, acetaminophen, nitroGLYCERIN, ondansetron (ZOFRAN) IV, sodium chloride flush   Vital Signs    Vitals:   11/04/17 2157 11/05/17 0021 11/05/17 0453 11/05/17 0737  BP: (!) 142/67 (!) 134/56 (!) 156/67 (!) 150/77  Pulse: 75 73 75 75  Resp: 18 16 16 20   Temp: 98 F (36.7 C) 98.4 F (36.9 C) 98.6 F (37 C) 97.8 F (36.6 C)  TempSrc: Oral Oral Oral Oral  SpO2: 99% 98% 96% 99%  Weight:      Height:        Intake/Output Summary (Last 24 hours) at 11/05/2017 1046 Last data filed at 11/05/2017 0700 Gross per 24 hour  Intake 600 ml  Output 1750 ml  Net -1150 ml   Filed Weights   11/03/17 1254 11/03/17 2010 11/04/17 0550  Weight: 78.9 kg 77.3 kg 77.4 kg    Telemetry    Afib/v paced at 80s - Personally Reviewed  ECG  N/A Physical Exam   GEN: elderly WNWD acute distress.   Neck: No JVD Cardiac: IR IR , no murmurs, rubs, or gallops.  Respiratory: Clear to auscultation bilaterally. GI: Soft, nontender, non-distended  MS: No edema; No deformity. Neuro:  Nonfocal  Psych: Normal affect   Labs    Chemistry Recent Labs  Lab 11/03/17 1254 11/04/17 0422 11/05/17 0615  NA 137 138 140  K 3.2* 3.2* 3.5  CL 99 100 101  CO2  30 29 30   GLUCOSE 240* 94 100*  BUN 20 17 15   CREATININE 0.95 0.87 0.90  CALCIUM 8.5* 8.0* 8.4*  GFRNONAA 53* 59* 57*  GFRAA >60 >60 >60  ANIONGAP 8 9 9      Hematology Recent Labs  Lab 11/03/17 1254 11/05/17 0615  WBC 6.2 6.2  RBC 3.59* 3.74*  HGB 11.0* 11.4*  HCT 34.9* 35.9*  MCV 97.2 96.0  MCH 30.6 30.5  MCHC 31.5 31.8  RDW 14.8 14.8  PLT 224 214    Cardiac Enzymes Recent Labs  Lab 11/04/17 1420 11/05/17 0615  TROPONINI 0.09* 0.07*    Recent Labs  Lab 11/03/17 1304  TROPIPOC 0.04     BNP Recent Labs  Lab 11/03/17 1254  BNP 373.6*    Radiology    Dg Chest 2 View  Result Date: 11/03/2017 CLINICAL DATA:  Shortness of breath and chest pressure. EXAM: CHEST - 2 VIEW COMPARISON:  10/15/2017 FINDINGS: A new right jugular Port-A-Cath terminates over the upper SVC. A left subclavian approach pacemaker remains in place with leads terminating over the right atrium and right ventricle. A leadless pacemaker is also again noted projecting over the right  ventricle. The cardiac silhouette remains enlarged. Aortic atherosclerosis is noted. There is improved aeration of the lung bases with minimal atelectasis remaining as well as possibly trace bilateral pleural effusions. No overt pulmonary edema is evident. No pneumothorax is seen. No acute osseous abnormality is identified. IMPRESSION: Improved aeration of the lung bases with minimal residual atelectasis and possible trace pleural effusions. Electronically Signed   By: Logan Bores M.D.   On: 11/03/2017 13:44    Cardiac Studies   N/a   Patient Profile     84 y.o. female with a hx of atrial fibrillation not on anticoagulation, CKD stage III, OSA on CPAP, chronic combined systolic and diastolic CHF, CADs/pmultiple stents and ongoing therapy for ovarian granulosa cell carcinoma(started chemo 1 week ago)who presented with chest pain and acute on chronic HF.   Assessment & Plan    1.Chest pain with history of  complicated CAD with multiple vessel stenting: -Most recent heart catheterization 07/14/2016 with difficult PCI to LCx requiring orbital arthrectomy. She was last seen in consultation for recent hospitalization 10/2017 for preoperative clearance and anticoagulation assistance for ovarian tumor removal. Her hospital course was complicated by life-threatening intra-abdominal bleed and therefore was taken off of all anticoagulation and antiplatelet therapies.  - Troponin mild flat. Not in ACE pattern. Likely demand. No recurrent pain.  - Continue statin, Imdur and BB.   2. Chronic combined systolic and diastolic CHF: -Echocardiogram on09/12/2017 with LVEF of 40 to 45% with diffuse hypokinesis and G2 DD -BNP 373. NET I & O negative 1.9L. Breathing back to normal. Euvolemic Resume home lasix and supplemental potassium tomorrow.    3. Permanent atrial fibrillation s/p PPM: -Patient currently not on anticoagulation secondary to above spite elevated Chadvasc score -Currently rate controlledbut now in afib with intermittent pacing - She on on metoprolol 100mg  BID as well as Cardizem CD 240mg  AM and 120mg  PM    4. Recent intra-abdominal life-threatening bleed: -Patient recently evaluated by her service as preoperative clearance for ovarian tumor removal. Hospital course complicated by acute life-threatening intra-abdominal bleed with recommendations to discontinue all anticoagulation and antiplatelet therapies.  5. History of ovarian granulosa cell carcinoma with recent initiation of chemotherapy 1 week prior: -Stable, per primary team  6. Hypokalemia - K of 3.5 today. Will give additional supplement today. Resume home dose tomorrow.   Dr. Irish Lack to review anticoagulation with oncology.    For questions or updates, please contact [place] Please consult [email or web address] for contact info under        Signed, Leanor Kail, PA  11/05/2017, 10:46 AM    I have examined the  patient and reviewed assessment and plan and discussed with patient.  Agree with above as stated.  In NSR at this time. Appears euvolmic.  She will avoid drinking excessive water.  SHe will drink when she is thirsty, rather than drinking to meet a certain target.  I would give her the flexibility to take an extra Lasix 1-2 x/week.  Dr. Alvy Bimler was ok with anticoagulation.  Will await Dr. Serita Grit recs as well.  COnsider restarting COumadin if they are ok with the bleeding risk.  She does not tolerate AFib with RVR well.  Therefore, continue aggressive use of beta blocker and calcium channel blocker.    OK to discharge from a cardiac standpoint.  Larae Grooms

## 2017-11-05 NOTE — Care Management Note (Signed)
Case Management Note  Patient Details  Name: Patty [name] MRN: [id number] Date of Birth: [birth year]  Subjective/Objective:      Chest pain, CAD, CHF              Action/Plan: Spoke to pt at bedside. States she lives alone but independent prior to hospital stay. Drives to her appts. She has a Radiation protection practitioner at home. Pt active with [facility] and PT. Contacted [facility] for resumption of care.  Dtr lives near to assist as needed.   Expected Discharge Date:  11/05/17               Expected Discharge Plan:  [place]  In-House Referral:  NA  Discharge planning Services  CM Consult  Post Acute Care Choice:  NA Choice offered to:  NA  DME Arranged:  N/A DME Agency:  NA  HH Arranged:  RN, PT [place] Agency:  [place]  Status of Service:  Completed, signed off  If discussed at [place] of Stay Meetings, dates discussed:    Additional Comments:  Erenest Rasher, RN 11/05/2017, 2:22 PM

## 2017-11-05 NOTE — Discharge Instructions (Signed)

## 2017-11-06 DIAGNOSIS — N183 Chronic kidney disease, stage 3 (moderate): Secondary | ICD-10-CM | POA: Diagnosis not present

## 2017-11-06 DIAGNOSIS — I130 Hypertensive heart and chronic kidney disease with heart failure and stage 1 through stage 4 chronic kidney disease, or unspecified chronic kidney disease: Secondary | ICD-10-CM | POA: Diagnosis not present

## 2017-11-06 DIAGNOSIS — C569 Malignant neoplasm of unspecified ovary: Secondary | ICD-10-CM | POA: Diagnosis not present

## 2017-11-06 DIAGNOSIS — I5040 Unspecified combined systolic (congestive) and diastolic (congestive) heart failure: Secondary | ICD-10-CM | POA: Diagnosis not present

## 2017-11-06 DIAGNOSIS — B0229 Other postherpetic nervous system involvement: Secondary | ICD-10-CM | POA: Diagnosis not present

## 2017-11-06 DIAGNOSIS — I2510 Atherosclerotic heart disease of native coronary artery without angina pectoris: Secondary | ICD-10-CM | POA: Diagnosis not present

## 2017-11-09 ENCOUNTER — Ambulatory Visit (INDEPENDENT_AMBULATORY_CARE_PROVIDER_SITE_OTHER): Payer: Medicare Other | Admitting: *Deleted

## 2017-11-09 DIAGNOSIS — I495 Sick sinus syndrome: Secondary | ICD-10-CM

## 2017-11-09 NOTE — Progress Notes (Signed)
Remote pacemaker transmission.   

## 2017-11-10 NOTE — Telephone Encounter (Signed)
Called and followed up with patient. Patient was seen in the hospital. Patient states that she is feeling better and denies any complaints at this time. Patient is scheduled for post hospital f/u on 10/22 with Estella Husk, PA.

## 2017-11-11 DIAGNOSIS — C569 Malignant neoplasm of unspecified ovary: Secondary | ICD-10-CM | POA: Diagnosis not present

## 2017-11-11 DIAGNOSIS — B0229 Other postherpetic nervous system involvement: Secondary | ICD-10-CM | POA: Diagnosis not present

## 2017-11-11 DIAGNOSIS — I2510 Atherosclerotic heart disease of native coronary artery without angina pectoris: Secondary | ICD-10-CM | POA: Diagnosis not present

## 2017-11-11 DIAGNOSIS — I5040 Unspecified combined systolic (congestive) and diastolic (congestive) heart failure: Secondary | ICD-10-CM | POA: Diagnosis not present

## 2017-11-11 DIAGNOSIS — N183 Chronic kidney disease, stage 3 (moderate): Secondary | ICD-10-CM | POA: Diagnosis not present

## 2017-11-11 DIAGNOSIS — I130 Hypertensive heart and chronic kidney disease with heart failure and stage 1 through stage 4 chronic kidney disease, or unspecified chronic kidney disease: Secondary | ICD-10-CM | POA: Diagnosis not present

## 2017-11-13 DIAGNOSIS — B0229 Other postherpetic nervous system involvement: Secondary | ICD-10-CM | POA: Diagnosis not present

## 2017-11-13 DIAGNOSIS — I2510 Atherosclerotic heart disease of native coronary artery without angina pectoris: Secondary | ICD-10-CM | POA: Diagnosis not present

## 2017-11-13 DIAGNOSIS — C569 Malignant neoplasm of unspecified ovary: Secondary | ICD-10-CM | POA: Diagnosis not present

## 2017-11-13 DIAGNOSIS — I130 Hypertensive heart and chronic kidney disease with heart failure and stage 1 through stage 4 chronic kidney disease, or unspecified chronic kidney disease: Secondary | ICD-10-CM | POA: Diagnosis not present

## 2017-11-13 DIAGNOSIS — N183 Chronic kidney disease, stage 3 (moderate): Secondary | ICD-10-CM | POA: Diagnosis not present

## 2017-11-13 DIAGNOSIS — I5040 Unspecified combined systolic (congestive) and diastolic (congestive) heart failure: Secondary | ICD-10-CM | POA: Diagnosis not present

## 2017-11-16 ENCOUNTER — Telehealth: Payer: Self-pay | Admitting: Interventional Cardiology

## 2017-11-16 ENCOUNTER — Telehealth: Payer: Self-pay | Admitting: Oncology

## 2017-11-16 MED ORDER — METOPROLOL TARTRATE 100 MG PO TABS: 100.0000 mg | ORAL_TABLET | Freq: Two times a day (BID) | ORAL | 9 refills | Status: DC

## 2017-11-16 NOTE — Telephone Encounter (Signed)
[facility] and advised her of the message from Dr. Alvy Bimler.  She verbalized agreement and understanding.

## 2017-11-16 NOTE — Telephone Encounter (Signed)
New Message    *STAT* If patient is at the pharmacy, call can be transferred to refill team.   1. Which medications need to be refilled? (please list name of each medication and dose if known) metoprolol tartrate (LOPRESSOR) 100 MG tablet Take 1 tablet (100 mg total) by mouth 2 (two) times daily.  2. Which pharmacy/location (including street and city if local pharmacy) is medication to be sent to?  [id number] - [place], [place] - [place]  3. Do they need a 30 day or 90 day supply? [name]

## 2017-11-16 NOTE — Telephone Encounter (Signed)
Patty [name] called and said she has had numbness and tingling (feels like her leg is falling asleep at times) in her right leg that starts in her thigh area and radiates down her leg.  She said it started on Friday and she was seen and assessed by an [place] who did not find any areas of redness or warmth.  She is taking tylenol in the morning and at bedtime which helps.  She is using a cane and also has a wheeled walker that she uses at home.  She wanted to make sure that Dr. Alvy Bimler is notified and is wondering if it is a side effect from chemotherapy.

## 2017-11-16 NOTE — Telephone Encounter (Signed)
Yes, it is due to side-effects from chemo If pain, OK to take scheduled tylenol 500 mg TID

## 2017-11-16 NOTE — Telephone Encounter (Signed)
Pt's medication was sent to pt's pharmacy as requested. Confirmation received.  °

## 2017-11-17 ENCOUNTER — Telehealth: Payer: Self-pay | Admitting: Oncology

## 2017-11-17 DIAGNOSIS — I130 Hypertensive heart and chronic kidney disease with heart failure and stage 1 through stage 4 chronic kidney disease, or unspecified chronic kidney disease: Secondary | ICD-10-CM | POA: Diagnosis not present

## 2017-11-17 DIAGNOSIS — N183 Chronic kidney disease, stage 3 (moderate): Secondary | ICD-10-CM | POA: Diagnosis not present

## 2017-11-17 DIAGNOSIS — I5040 Unspecified combined systolic (congestive) and diastolic (congestive) heart failure: Secondary | ICD-10-CM | POA: Diagnosis not present

## 2017-11-17 DIAGNOSIS — I2510 Atherosclerotic heart disease of native coronary artery without angina pectoris: Secondary | ICD-10-CM | POA: Diagnosis not present

## 2017-11-17 DIAGNOSIS — C569 Malignant neoplasm of unspecified ovary: Secondary | ICD-10-CM | POA: Diagnosis not present

## 2017-11-17 DIAGNOSIS — B0229 Other postherpetic nervous system involvement: Secondary | ICD-10-CM | POA: Diagnosis not present

## 2017-11-17 NOTE — Telephone Encounter (Signed)
Patty [name] called and said she had a prescription for gabapentin that she did not fill.  She said it is not on her med list now and thinks it was taken off during her last admission.  Advised her that it is for neuropathy and she is wondering if that would help with the pain she is having.  She did mention she took Dilaudid last night and this morning which helped.  Advised her that I will check with Dr. Alvy Bimler to see if she can start taking it now.

## 2017-11-18 ENCOUNTER — Other Ambulatory Visit: Payer: Self-pay

## 2017-11-18 ENCOUNTER — Encounter: Payer: Self-pay | Admitting: Cardiology

## 2017-11-18 ENCOUNTER — Telehealth: Payer: Self-pay

## 2017-11-18 MED ORDER — GABAPENTIN 300 MG PO CAPS: 300.0000 mg | ORAL_CAPSULE | Freq: Two times a day (BID) | ORAL | 2 refills | Status: DC

## 2017-11-18 NOTE — Telephone Encounter (Signed)
Called per Dr. Alvy Bimler regarding request frim pharmacy for 90 day supply Gabapentin. She will start taking it today. See note below. She would like a 90 day supply. Rx sent.

## 2017-11-19 ENCOUNTER — Telehealth: Payer: Self-pay | Admitting: Hematology and Oncology

## 2017-11-19 ENCOUNTER — Encounter: Payer: Self-pay | Admitting: Hematology and Oncology

## 2017-11-19 ENCOUNTER — Inpatient Hospital Stay: Payer: Medicare Other | Attending: Hematology and Oncology

## 2017-11-19 ENCOUNTER — Encounter: Payer: Self-pay | Admitting: Oncology

## 2017-11-19 ENCOUNTER — Inpatient Hospital Stay: Payer: Medicare Other

## 2017-11-19 ENCOUNTER — Inpatient Hospital Stay (HOSPITAL_BASED_OUTPATIENT_CLINIC_OR_DEPARTMENT_OTHER): Payer: Medicare Other | Admitting: Hematology and Oncology

## 2017-11-19 VITALS — BP 153/63 | HR 68 | Temp 97.6°F | Resp 18 | Ht 60.0 in | Wt 170.6 lb

## 2017-11-19 DIAGNOSIS — Z79899 Other long term (current) drug therapy: Secondary | ICD-10-CM | POA: Insufficient documentation

## 2017-11-19 DIAGNOSIS — I5040 Unspecified combined systolic (congestive) and diastolic (congestive) heart failure: Secondary | ICD-10-CM

## 2017-11-19 DIAGNOSIS — T451X5A Adverse effect of antineoplastic and immunosuppressive drugs, initial encounter: Secondary | ICD-10-CM

## 2017-11-19 DIAGNOSIS — Z5111 Encounter for antineoplastic chemotherapy: Secondary | ICD-10-CM | POA: Diagnosis not present

## 2017-11-19 DIAGNOSIS — G620 Drug-induced polyneuropathy: Secondary | ICD-10-CM

## 2017-11-19 DIAGNOSIS — D61818 Other pancytopenia: Secondary | ICD-10-CM | POA: Diagnosis not present

## 2017-11-19 DIAGNOSIS — C786 Secondary malignant neoplasm of retroperitoneum and peritoneum: Secondary | ICD-10-CM | POA: Diagnosis not present

## 2017-11-19 DIAGNOSIS — T451X5D Adverse effect of antineoplastic and immunosuppressive drugs, subsequent encounter: Secondary | ICD-10-CM | POA: Insufficient documentation

## 2017-11-19 DIAGNOSIS — Z9049 Acquired absence of other specified parts of digestive tract: Secondary | ICD-10-CM

## 2017-11-19 DIAGNOSIS — C569 Malignant neoplasm of unspecified ovary: Secondary | ICD-10-CM

## 2017-11-19 DIAGNOSIS — D3910 Neoplasm of uncertain behavior of unspecified ovary: Secondary | ICD-10-CM

## 2017-11-19 DIAGNOSIS — Z950 Presence of cardiac pacemaker: Secondary | ICD-10-CM | POA: Insufficient documentation

## 2017-11-19 LAB — COMPREHENSIVE METABOLIC PANEL
ALBUMIN: 2.9 g/dL — AB (ref 3.5–5.0)
ALT: 22 U/L (ref 0–44)
ANION GAP: 9 (ref 5–15)
AST: 29 U/L (ref 15–41)
Alkaline Phosphatase: 115 U/L (ref 38–126)
BUN: 16 mg/dL (ref 8–23)
CO2: 32 mmol/L (ref 22–32)
Calcium: 9 mg/dL (ref 8.9–10.3)
Chloride: 103 mmol/L (ref 98–111)
Creatinine, Ser: 1.04 mg/dL — ABNORMAL HIGH (ref 0.44–1.00)
GFR calc Af Amer: 55 mL/min — ABNORMAL LOW (ref >60)
GFR calc non Af Amer: 48 mL/min — ABNORMAL LOW (ref >60)
GLUCOSE: 129 mg/dL — AB (ref 70–99)
POTASSIUM: 3.4 mmol/L — AB (ref 3.5–5.1)
SODIUM: 144 mmol/L (ref 135–145)
Total Bilirubin: 0.7 mg/dL (ref 0.3–1.2)
Total Protein: 6.3 g/dL — ABNORMAL LOW (ref 6.5–8.1)

## 2017-11-19 LAB — CBC WITH DIFFERENTIAL/PLATELET
Abs Immature Granulocytes: 0.01 10*3/uL (ref 0.00–0.07)
BASOS PCT: 1 %
Basophils Absolute: 0 10*3/uL (ref 0.0–0.1)
Eosinophils Absolute: 0.1 10*3/uL (ref 0.0–0.5)
Eosinophils Relative: 1 %
HCT: 33.2 % — ABNORMAL LOW (ref 36.0–46.0)
HEMOGLOBIN: 10.6 g/dL — AB (ref 12.0–15.0)
Immature Granulocytes: 0 %
LYMPHS ABS: 0.8 10*3/uL (ref 0.7–4.0)
LYMPHS PCT: 22 %
MCH: 30.8 pg (ref 26.0–34.0)
MCHC: 31.9 g/dL (ref 30.0–36.0)
MCV: 96.5 fL (ref 80.0–100.0)
MONOS PCT: 9 %
Monocytes Absolute: 0.3 10*3/uL (ref 0.1–1.0)
NEUTROS ABS: 2.6 10*3/uL (ref 1.7–7.7)
NEUTROS PCT: 67 %
PLATELETS: 147 10*3/uL — AB (ref 150–400)
RBC: 3.44 MIL/uL — ABNORMAL LOW (ref 3.87–5.11)
RDW: 17.4 % — ABNORMAL HIGH (ref 11.5–15.5)
WBC: 3.8 10*3/uL — ABNORMAL LOW (ref 4.0–10.5)
nRBC: 0 % (ref 0.0–0.2)

## 2017-11-19 MED ORDER — FAMOTIDINE IN NACL 20-0.9 MG/50ML-% IV SOLN: 20.0000 mg | Freq: Once | INTRAVENOUS | Status: DC

## 2017-11-19 MED ORDER — DIPHENHYDRAMINE HCL 50 MG/ML IJ SOLN: 12.5000 mg | Freq: Once | INTRAMUSCULAR | Status: DC

## 2017-11-19 MED ORDER — PALONOSETRON HCL INJECTION 0.25 MG/5ML
INTRAVENOUS | Status: AC
  Filled 2017-11-19: qty 5

## 2017-11-19 MED ORDER — SODIUM CHLORIDE 0.9 % IV SOLN
310.0000 mg | Freq: Once | INTRAVENOUS | Status: AC
  Administered 2017-11-19: 310 mg via INTRAVENOUS
  Filled 2017-11-19: qty 31

## 2017-11-19 MED ORDER — SODIUM CHLORIDE 0.9 % IV SOLN
Freq: Once | INTRAVENOUS | Status: AC
  Administered 2017-11-19: 10:00:00 via INTRAVENOUS
  Filled 2017-11-19: qty 5

## 2017-11-19 MED ORDER — PALONOSETRON HCL INJECTION 0.25 MG/5ML
0.2500 mg | Freq: Once | INTRAVENOUS | Status: AC
  Administered 2017-11-19: 0.25 mg via INTRAVENOUS

## 2017-11-19 MED ORDER — DIPHENHYDRAMINE HCL 50 MG/ML IJ SOLN
INTRAMUSCULAR | Status: AC
  Filled 2017-11-19: qty 1

## 2017-11-19 MED ORDER — HEPARIN SOD (PORK) LOCK FLUSH 100 UNIT/ML IV SOLN
500.0000 [IU] | Freq: Once | INTRAVENOUS | Status: AC | PRN
  Administered 2017-11-19: 500 [IU]
  Filled 2017-11-19: qty 5

## 2017-11-19 MED ORDER — SODIUM CHLORIDE 0.9% FLUSH
10.0000 mL | INTRAVENOUS | Status: DC | PRN
  Administered 2017-11-19: 10 mL
  Filled 2017-11-19: qty 10

## 2017-11-19 MED ORDER — FAMOTIDINE IN NACL 20-0.9 MG/50ML-% IV SOLN
INTRAVENOUS | Status: AC
  Filled 2017-11-19: qty 50

## 2017-11-19 MED ORDER — SODIUM CHLORIDE 0.9 % IV SOLN
Freq: Once | INTRAVENOUS | Status: AC
  Administered 2017-11-19: 10:00:00 via INTRAVENOUS
  Filled 2017-11-19: qty 250

## 2017-11-19 NOTE — Assessment & Plan Note (Signed)
She will continue medical management She has no clinical signs or symptoms of congestive heart failure

## 2017-11-19 NOTE — Telephone Encounter (Signed)
Gave avs and calendar ° °

## 2017-11-19 NOTE — Patient Instructions (Signed)
[facility] Discharge Instructions for Patients Receiving Chemotherapy  Today you received the following chemotherapy agents Carboplatin  To help prevent nausea and vomiting after your treatment, we encourage you to take your nausea medication as directed   If you develop nausea and vomiting that is not controlled by your nausea medication, call the clinic.   BELOW ARE SYMPTOMS THAT SHOULD BE REPORTED IMMEDIATELY:  *FEVER GREATER THAN 100.5 F  *CHILLS WITH OR WITHOUT FEVER  NAUSEA AND VOMITING THAT IS NOT CONTROLLED WITH YOUR NAUSEA MEDICATION  *UNUSUAL SHORTNESS OF BREATH  *UNUSUAL BRUISING OR BLEEDING  TENDERNESS IN MOUTH AND THROAT WITH OR WITHOUT PRESENCE OF ULCERS  *URINARY PROBLEMS  *BOWEL PROBLEMS  UNUSUAL RASH Items with * indicate a potential emergency and should be followed up as soon as possible.  Feel free to call the clinic should you have any questions or concerns. The clinic phone number is [phone].  Please show the CHEMO ALERT CARD at check-in to the Emergency Department and triage nurse.   

## 2017-11-19 NOTE — Assessment & Plan Note (Signed)
She tolerated treatment well except for some mild peripheral neuropathy I will order some minor dose adjustment We discussed minimum 3 cycles of treatment before repeat imaging study.

## 2017-11-19 NOTE — Progress Notes (Signed)
Went to infusion to introduce myself to patient as Arboriculturist. Advised patient that she may apply for a one-time grant to to assist with personal expenses while going through treatment. She verbalized understanding. Gave her my card for any additional financial questions or concerns.

## 2017-11-19 NOTE — Progress Notes (Signed)
[place] OFFICE PROGRESS NOTE  Patient Care Team: [name], [name], MD as PCP - Cardiology (Cardiology) [name], [name], MD as Consulting Physician (Cardiology)  ASSESSMENT & PLAN:  Granulosa cell tumor of ovary She tolerated treatment well except for some mild peripheral neuropathy I will order some minor dose adjustment We discussed minimum 3 cycles of treatment before repeat imaging study.  Peripheral neuropathy due to chemotherapy [facility]) she has mild peripheral neuropathy, likely related to side effects of treatment. I plan to reduce the dose of treatment as outlined above.  I explained to the patient the rationale of this strategy and reassured the patient it would not compromise the efficacy of treatment She has low-dose gabapentin and she would like to try  Pancytopenia, acquired [facility]) The patient is elderly and frail I recommend minor dose adjustment to treatment We will monitor carefully For now, she does not need further work-up, transfusion support or G-CSF  Combined systolic and diastolic heart failure, NYHA class 3 ([employer]) She will continue medical management She has no clinical signs or symptoms of congestive heart failure   Orders Placed This Encounter  Procedures  . CT ABDOMEN PELVIS W CONTRAST    Standing Status:   Future    Standing Expiration Date:   11/20/2018    Order Specific Question:   If indicated for the ordered procedure, I authorize the administration of contrast media per Radiology protocol    Answer:   Yes    Order Specific Question:   Preferred imaging location?    Answer:   [facility]    Order Specific Question:   Radiology Contrast Protocol - do NOT remove file path    Answer:   [email or web address]    INTERVAL HISTORY: Please see below for problem oriented charting. She  returns for further follow-up, to be seen prior to cycle 2 of treatment She has mild neuropathy that bothers her intermittently No recent falls Her abdomen feels okay Denies recent nausea, vomiting or constipation No recent chest pain or shortness of breath She has not resumed anticoagulation therapy.  She follows closely with cardiologist Denies recent infection, fever chills  SUMMARY OF ONCOLOGIC HISTORY:   Granulosa cell tumor of ovary   02/19/1998 Initial Diagnosis    The granulosa cell tumor was initially diagnosed at [facility] in 2000, with recurrences in 2004, 2007 and spring 2011. The original operative note from 2000 and that pathology did not transfer into present EMR and I do not know laterality of the original tumor. The surgery in 2004 included resection of the tumor with small bowel resection and colectomy for diverticulosis. She had laparoscopic repair of ventral hernia in 2005. In 2007 she had resection of pelvic recurrence of tumor plus open repair of ventral hernia with mesh. Most recent intervention was laparoscopic resection of suprapubic involvement by Dr.Hoxworth in May 2011; she had a small and asymptomatic suprapubic hernia in 2011 which has been followed. All of her disease has been very indolent and has generally been detected by CT scans. Last CT AP was April 2013; [name], self-limited episode of bowel obstruction in May 2012.    08/24/2002 Pathology Results    SOFT TISSUE MASS, [name], [name], there  are tumor cells present which appear histologically similar to those noted in the patient's previous granulosa cell tumor ([id number]) and therefore, consistent with recurrent/metastatic granulosa cell tumor. The findings correlate with the cytology material    12/06/2002 Pathology Results    1.  SMALL INTESTINE, [name], SIGMOID, SEGMENTAL RESECTION: DIVERTICULOSIS WITH FOCAL MILD CHRONIC DIVERTICULITIS. SIX BENIGN LYMPH NODES. NO TUMOR IDENTIFIED.    05/12/2005 PET scan    Again identified are enlarged lymph nodes within the right posterior cervical region and left external iliac region, [name], especially when compared with the examination dated 10/30/04.    10/01/2005 Pathology Results    1. ABDOMINAL WALL MASS: METASTATIC GRANULOSA CELL TUMOR.  2. LEFT ILIAC MASS: METASTATIC GRANULOSA CELL TUMOR.  COMMENT Both specimens are involved by metastatic tumor with features consistent with metastasis from the previously diagnosed adult granulosa cell tumor.     06/11/2007 Imaging    Stable CT the pelvis - no evidence of metastatic disease.    06/09/2008 Imaging    1.  Interval enlargement of small left perivesical nodule measuring 18 mm maximally.  This may reflect a lymph node but has a nonspecific appearance.  Attention to this area followup is recommended. 2.  Otherwise stable postsurgical appearance of the pelvis.    12/14/2008 Imaging    1.  Mild increase in size of low attenuation nodule in the left anterior pelvis indenting the urinary bladder, which now measures 2.8 cm compared with 1.8 cm on prior study.  Metastatic disease cannot be excluded. 2.  No other significant changes identified.  Stable diverticulosis and small suprapubic anterior abdominal wall hernia containing a loop of small bowel.    05/23/2009 Pathology Results    1. SOFT TISSUE MASS, SIMPLE EXCISION, : INVOLVEMENT BY NEOPLASM CONSISTENT WITH METASTATIC GRANULOSA  CELL TUMOR.    06/08/2010 Imaging    1.  No acute findings. 2.  Interval progression of mild diffuse left renal atrophy and hypoperfusion, consistent with left renal artery stenosis. 3.  Stable small hiatal hernia, tiny periumbilical hernia, and small suprapubic hernia containing a loop of small bowel.  No evidence of bowel ischemia or obstruction. 4.  Diverticulosis.  No radiographic evidence of diverticulitis.    05/22/2011 Imaging    1.  Stable exam.  No new or progressive disease identified within the abdomen or pelvis. 2.  Stable small hiatal hernia and suprapubic ventral hernia. Stable left renal atrophy, likely secondary to renal artery stenosis.    11/28/2011 Imaging    1.  No findings to suggest recurrent or metastatic disease in the abdomen or pelvis on today's examination. 2.  Indeterminate liver lesion in the right lobe of the liver is unchanged compared to numerous remote prior examinations. 3. Small suprapubic ventral hernia again noted. 4.  Extensive atherosclerosis including at least three vessel coronary artery disease.  5.  Postoperative changes, as above.     12/01/2016 Tumor Marker    Patient's tumor was tested for the following markers: Inhibin B Results of the tumor marker test revealed 119.2    12/11/2016 Imaging    Multiple new soft tissue masses in left pelvis and central small bowel mesentery, consistent  with recurrent/metastatic carcinoma.     03/25/2017 Imaging    1. Omental and mesenteric tumor implants throughout the abdomen and pelvis with mixed findings when compared the prior study. Some of these lesions are clearly smaller and others are clearly larger. No new lesions are identified. 2. Stable severe atherosclerotic calcifications. 3. Stable low anterior abdominal wall hernia. 4. Stable low-attenuation lesion involving the right hepatic lobe with adjacent calcifications or surgical clips.    10/12/2017 Imaging    Extraperitoneal fluid/hemorrhage  related to the patient's dominant left pelvic tumor, as described above.  Progression of the patient's metastatic ovarian cancer, as described above. Dominant cystic/solid mass in the left adnexa/lower pelvis has increased in size. Peritoneal disease/omental caking is mildly progressive. New pulmonary nodule at the right lung base, indeterminate.     10/12/2017 - 10/17/2017 Hospital Admission    She was admitted for retroperitoneal bleed.    10/16/2017 Imaging    1. Improving peritoneal hemorrhage. 2. No significant change in cystic and solid left pelvic mass and smaller anterior peritoneal cystic and solid mass compatible with tumor implant. 3. No evidence of metastatic disease elsewhere in the abdomen or pelvis. 4. Cardiomegaly. 5.  Calcific coronary artery and aortic atherosclerosis.  Aortic Atherosclerosis (ICD10-I70.0).    10/27/2017 Tumor Marker    Patient's tumor was tested for the following markers: Inhibin B Results of the tumor marker test revealed 98.4    10/28/2017 Procedure    Status post right IJ port catheter placement.     Ovarian cancer ([place])   10/19/2017 Initial Diagnosis    Ovarian cancer ([place])     REVIEW OF SYSTEMS:   Constitutional: Denies fevers, chills or abnormal weight loss Eyes: Denies blurriness of vision Ears, nose, mouth, throat, and face: Denies mucositis or sore throat Respiratory: Denies cough, dyspnea or wheezes Cardiovascular: Denies palpitation, chest discomfort or lower extremity swelling Gastrointestinal:  Denies nausea, heartburn or change in bowel habits Skin: Denies abnormal skin rashes Lymphatics: Denies new lymphadenopathy or easy bruising Behavioral/Psych: Mood is stable, no new changes  All other systems were reviewed with the patient and are negative.  I have reviewed the past medical history, past surgical history, social history and family history with the patient and they are unchanged from previous note.  ALLERGIES:  is  allergic to bee venom; other; amiodarone; and prednisone.  MEDICATIONS:  Current Outpatient Medications  Medication Sig Dispense Refill  . acetaminophen (TYLENOL) 500 MG tablet Take 500 mg by mouth at bedtime as needed for mild pain.     Marland Kitchen atorvastatin (LIPITOR) 80 MG tablet TAKE 1 TABLET BY MOUTH EVERY MORNING (Patient taking differently: Take 80 mg by mouth every morning. ) 90 tablet 2  . beta carotene w/minerals (OCUVITE) tablet Take 1 tablet by mouth 2 (two) times daily.     . Calcium Carb-Cholecalciferol (CALTRATE 600+D) 600-800 MG-UNIT TABS Take 1 tablet by mouth every evening.     . Coenzyme Q-10 100 MG capsule Take 100-200 mg by mouth daily.     Marland Kitchen diltiazem (CARDIZEM CD) 120 MG 24 hr capsule TAKE ONE CAPSULE BY MOUTH AT BEDTIME (Patient taking differently: Take 120 mg by mouth at bedtime. ) 90 capsule 3  . diltiazem (CARDIZEM CD) 240 MG 24 hr capsule TAKE ONE CAPSULE BY MOUTH DAILY WITH BREAKFAST (Patient taking differently: Take 240 mg by mouth daily. ) 30 capsule 11  . docusate sodium (COLACE) 100 MG capsule Take 100 mg by mouth 2 (two) times daily.    Marland Kitchen  furosemide (LASIX) 40 MG tablet Take 80 mg by mouth daily.    Marland Kitchen gabapentin (NEURONTIN) 300 MG capsule Take 1 capsule (300 mg total) by mouth 2 (two) times daily. 180 capsule 2  . HYDROmorphone (DILAUDID) 2 MG tablet Take 1 tablet (2 mg total) by mouth every 4 (four) hours as needed for severe pain. 30 tablet 0  . isosorbide mononitrate (IMDUR) 60 MG 24 hr tablet TAKE 1 TABLET BY MOUTH DAILY (Patient taking differently: Take 60 mg by mouth daily. ) 30 tablet 9  . KLOR-CON M20 20 MEQ tablet TAKE 1 TABLET BY MOUTH EVERY DAY (Patient taking differently: Take 20 mEq by mouth at bedtime. ) 90 tablet 3  . lidocaine-prilocaine (EMLA) cream Apply 1 application topically as needed. (Patient taking differently: Apply 1 application topically as needed (port access). ) 30 g 6  . metoprolol tartrate (LOPRESSOR) 100 MG tablet Take 1 tablet (100 mg  total) by mouth 2 (two) times daily. 60 tablet 9  . Multiple Vitamin (MULTIVITAMIN) capsule Take 1 capsule by mouth daily.    . nitroGLYCERIN (NITROSTAT) 0.4 MG SL tablet PLACE 1 TABLET (0.4 MG TOTAL) UNDER THE TONGUE EVERY 5 (FIVE) MINUTES AS NEEDED. FOR CHEST PAIN (Patient taking differently: Place 0.4 mg under the tongue every 5 (five) minutes as needed for chest pain. ) 25 tablet 6  . NON FORMULARY Place 1 each into the nose at bedtime. CPAP setting 3.5    . Omega-3 Fatty Acids (FISH OIL) 1200 MG CAPS Take 1,200 mg by mouth daily.     . ondansetron (ZOFRAN) 8 MG tablet Take 1 tablet (8 mg total) by mouth every 8 (eight) hours as needed for nausea. 30 tablet 3  . prochlorperazine (COMPAZINE) 10 MG tablet Take 1 tablet (10 mg total) by mouth every 6 (six) hours as needed for nausea or vomiting. 30 tablet 0  . spironolactone (ALDACTONE) 25 MG tablet Take 25 mg by mouth at bedtime as needed (swelling in ankles).      No current facility-administered medications for this visit.     PHYSICAL EXAMINATION: ECOG PERFORMANCE STATUS: 2 - Symptomatic, <50% confined to bed  Vitals:   11/19/17 0837  BP: (!) 153/63  Pulse: 68  Resp: 18  Temp: 97.6 F (36.4 C)  SpO2: 97%   Filed Weights   11/19/17 0837  Weight: 170 lb 9.6 oz (77.4 kg)    GENERAL:alert, no distress and comfortable SKIN: skin color, texture, turgor are normal, no rashes or significant lesions EYES: normal, Conjunctiva are pink and non-injected, sclera clear OROPHARYNX:no exudate, no erythema and lips, buccal mucosa, and tongue normal  NECK: supple, thyroid normal size, non-tender, without nodularity LYMPH:  no palpable lymphadenopathy in the cervical, axillary or inguinal LUNGS: clear to auscultation and percussion with normal breathing effort HEART: regular rate & rhythm and no murmurs and no lower extremity edema ABDOMEN:abdomen soft, non-tender and normal bowel sounds Musculoskeletal:no cyanosis of digits and no clubbing   NEURO: alert & oriented x 3 with fluent speech, no focal motor/sensory deficits  LABORATORY DATA:  I have reviewed the data as listed    Component Value Date/Time   NA 144 11/19/2017 0800   NA 142 12/14/2014 1324   K 3.4 (L) 11/19/2017 0800   K 4.2 12/14/2014 1324   CL 103 11/19/2017 0800   CL 105 05/26/2012 1111   CO2 32 11/19/2017 0800   CO2 30 (H) 12/14/2014 1324   GLUCOSE 129 (H) 11/19/2017 0800   GLUCOSE 130 12/14/2014  1324   GLUCOSE 89 05/26/2012 1111   BUN 16 11/19/2017 0800   BUN 19.6 12/01/2016 1116   CREATININE 1.04 (H) 11/19/2017 0800   CREATININE 1.03 03/12/2017 1329   CREATININE 0.9 12/01/2016 1116   CALCIUM 9.0 11/19/2017 0800   CALCIUM 9.1 12/14/2014 1324   PROT 6.3 (L) 11/19/2017 0800   PROT 6.5 10/08/2017 0905   PROT 6.5 12/14/2014 1324   ALBUMIN 2.9 (L) 11/19/2017 0800   ALBUMIN 3.9 10/08/2017 0905   ALBUMIN 3.4 (L) 12/14/2014 1324   AST 29 11/19/2017 0800   AST 29 12/14/2014 1324   ALT 22 11/19/2017 0800   ALT 20 12/14/2014 1324   ALKPHOS 115 11/19/2017 0800   ALKPHOS 103 12/14/2014 1324   BILITOT 0.7 11/19/2017 0800   BILITOT 0.6 10/08/2017 0905   BILITOT 0.77 12/14/2014 1324   GFRNONAA 48 (L) 11/19/2017 0800   GFRNONAA 49 (L) 03/12/2017 1329   GFRAA 55 (L) 11/19/2017 0800   GFRAA 56 (L) 03/12/2017 1329    No results found for: SPEP, UPEP  Lab Results  Component Value Date   WBC 3.8 (L) 11/19/2017   NEUTROABS 2.6 11/19/2017   HGB 10.6 (L) 11/19/2017   HCT 33.2 (L) 11/19/2017   MCV 96.5 11/19/2017   PLT 147 (L) 11/19/2017      Chemistry      Component Value Date/Time   NA 144 11/19/2017 0800   NA 142 12/14/2014 1324   K 3.4 (L) 11/19/2017 0800   K 4.2 12/14/2014 1324   CL 103 11/19/2017 0800   CL 105 05/26/2012 1111   CO2 32 11/19/2017 0800   CO2 30 (H) 12/14/2014 1324   BUN 16 11/19/2017 0800   BUN 19.6 12/01/2016 1116   CREATININE 1.04 (H) 11/19/2017 0800   CREATININE 1.03 03/12/2017 1329   CREATININE 0.9 12/01/2016 1116       Component Value Date/Time   CALCIUM 9.0 11/19/2017 0800   CALCIUM 9.1 12/14/2014 1324   ALKPHOS 115 11/19/2017 0800   ALKPHOS 103 12/14/2014 1324   AST 29 11/19/2017 0800   AST 29 12/14/2014 1324   ALT 22 11/19/2017 0800   ALT 20 12/14/2014 1324   BILITOT 0.7 11/19/2017 0800   BILITOT 0.6 10/08/2017 0905   BILITOT 0.77 12/14/2014 1324       RADIOGRAPHIC STUDIES: I have personally reviewed the radiological images as listed and agreed with the findings in the report. Dg Chest 2 View  Result Date: 11/03/2017 CLINICAL DATA:  Shortness of breath and chest pressure. EXAM: CHEST - 2 VIEW COMPARISON:  10/15/2017 FINDINGS: A new right jugular Port-A-Cath terminates over the upper SVC. A left subclavian approach pacemaker remains in place with leads terminating over the right atrium and right ventricle. A leadless pacemaker is also again noted projecting over the right ventricle. The cardiac silhouette remains enlarged. Aortic atherosclerosis is noted. There is improved aeration of the lung bases with minimal atelectasis remaining as well as possibly trace bilateral pleural effusions. No overt pulmonary edema is evident. No pneumothorax is seen. No acute osseous abnormality is identified. IMPRESSION: Improved aeration of the lung bases with minimal residual atelectasis and possible trace pleural effusions. Electronically Signed   By: Logan Bores M.D.   On: 11/03/2017 13:44   Ir Imaging Guided Port Insertion  Result Date: 10/28/2017 INDICATION: 84 year old female with a history of ovarian neoplasm, referred for port catheter placement EXAM: IMPLANTED PORT A CATH PLACEMENT WITH ULTRASOUND AND FLUOROSCOPIC GUIDANCE MEDICATIONS: 2 g Ancef; The antibiotic was  administered within an appropriate time interval prior to skin puncture. ANESTHESIA/SEDATION: Moderate (conscious) sedation was employed during this procedure. A total of Versed 1.0 mg and Fentanyl 25 mcg was administered intravenously.  Moderate Sedation Time: 17 minutes. The patient's level of consciousness and vital signs were monitored continuously by radiology nursing throughout the procedure under my direct supervision. FLUOROSCOPY TIME:  0 minutes, 6 seconds (1 mGy) COMPLICATIONS: None PROCEDURE: The procedure, risks, benefits, and alternatives were explained to the patient. Questions regarding the procedure were encouraged and answered. The patient understands and consents to the procedure. Ultrasound survey was performed with images stored and sent to PACs. The right neck and chest was prepped with chlorhexidine, and draped in the usual sterile fashion using maximum barrier technique (cap and mask, sterile gown, sterile gloves, large sterile sheet, hand hygiene and cutaneous antiseptic). Antibiotic prophylaxis was provided with 2.0g Ancef administered IV one hour prior to skin incision. Local anesthesia was attained by infiltration with 1% lidocaine without epinephrine. Ultrasound demonstrated patency of the right internal jugular vein, and this was documented with an image. Under real-time ultrasound guidance, this vein was accessed with a 21 gauge micropuncture needle and image documentation was performed. A small dermatotomy was made at the access site with an 11 scalpel. A 0.018" wire was advanced into the SVC and used to estimate the length of the internal catheter. The access needle exchanged for a 35F micropuncture vascular sheath. The 0.018" wire was then removed and a 0.035" wire advanced into the IVC. An appropriate location for the subcutaneous reservoir was selected below the clavicle and an incision was made through the skin and underlying soft tissues. The subcutaneous tissues were then dissected using a combination of blunt and sharp surgical technique and a pocket was formed. A single lumen power injectable portacatheter was then tunneled through the subcutaneous tissues from the pocket to the dermatotomy and the port  reservoir placed within the subcutaneous pocket. The venous access site was then serially dilated and a peel away vascular sheath placed over the wire. The wire was removed and the port catheter advanced into position under fluoroscopic guidance. The catheter tip is positioned in the cavoatrial junction. This was documented with a spot image. The portacatheter was then tested and found to flush and aspirate well. The port was flushed with saline followed by 100 units/mL heparinized saline. The pocket was then closed in two layers using first subdermal inverted interrupted absorbable sutures followed by a running subcuticular suture. The epidermis was then sealed with Dermabond. The dermatotomy at the venous access site was also seal with Dermabond. Patient tolerated the procedure well and remained hemodynamically stable throughout. No complications encountered and no significant blood loss encountered IMPRESSION: Status post right IJ port catheter placement. Signed, Dulcy Fanny. Dellia Nims, RPVI [facility] Electronically Signed   By: Corrie Mckusick D.O.   On: 10/28/2017 13:02    All questions were answered. The patient knows to call the clinic with any problems, questions or concerns. No barriers to learning was detected.  I spent 25 minutes counseling the patient face to face. The total time spent in the appointment was 30 minutes and more than 50% was on counseling and review of test results  Heath Lark, MD 11/19/2017 3:41 PM

## 2017-11-19 NOTE — Assessment & Plan Note (Addendum)
she has mild peripheral neuropathy, likely related to side effects of treatment. I plan to reduce the dose of treatment as outlined above.  I explained to the patient the rationale of this strategy and reassured the patient it would not compromise the efficacy of treatment She has low-dose gabapentin and she would like to try

## 2017-11-19 NOTE — Assessment & Plan Note (Signed)
The patient is elderly and frail I recommend minor dose adjustment to treatment We will monitor carefully For now, she does not need further work-up, transfusion support or G-CSF

## 2017-11-20 DIAGNOSIS — N183 Chronic kidney disease, stage 3 (moderate): Secondary | ICD-10-CM | POA: Diagnosis not present

## 2017-11-20 DIAGNOSIS — C569 Malignant neoplasm of unspecified ovary: Secondary | ICD-10-CM | POA: Diagnosis not present

## 2017-11-20 DIAGNOSIS — I5040 Unspecified combined systolic (congestive) and diastolic (congestive) heart failure: Secondary | ICD-10-CM | POA: Diagnosis not present

## 2017-11-20 DIAGNOSIS — I130 Hypertensive heart and chronic kidney disease with heart failure and stage 1 through stage 4 chronic kidney disease, or unspecified chronic kidney disease: Secondary | ICD-10-CM | POA: Diagnosis not present

## 2017-11-20 DIAGNOSIS — B0229 Other postherpetic nervous system involvement: Secondary | ICD-10-CM | POA: Diagnosis not present

## 2017-11-20 DIAGNOSIS — I2510 Atherosclerotic heart disease of native coronary artery without angina pectoris: Secondary | ICD-10-CM | POA: Diagnosis not present

## 2017-11-23 ENCOUNTER — Telehealth: Payer: Self-pay | Admitting: Oncology

## 2017-11-23 NOTE — Telephone Encounter (Signed)
Patty [name] left a message saying that she had severe diarrhea in the middle of the night for about an hour.  She is feeling better today without any pain or nausea and is resting.  She just wanted to let us know.  Called her back and let her know that she can take Imodium over the counter as needed for diarrhea (follow the package directions).  She verbalized understanding and agreement.

## 2017-11-23 NOTE — Progress Notes (Deleted)
Cardiology Office Note    Date:  11/23/2017   ID:  Patty [name], DOB 1930-12-10, MRN [id number]  PCP:  [name], MD  Cardiologist: Larae Grooms, MD EPS: None  No chief complaint on file.   History of Present Illness:  Patty [name] is a 84 y.o. female with history of atrial fibrillation not on anticoagulation because of life-threatening bleed 10/2017, permanent pacemaker, CKD stage III, OSA on CPAP, chronic combined systolic and diastolic CHF, CAD status post multiple stents, last heart cath 07/2016 difficult PCI to the circumflex requiring orbital atherectomy.  Ovarian granulosa cell carcinoma undergoing chemotherapy.  She had ovarian tumor removal 0/2542 that was complicated by life-threatening intra-abdominal bleed was taken off all anticoagulation and antiplatelet therapy.  Patient was discharged from the hospital 11/05/2017 after admission with chest pain and acute on chronic CHF.  Troponins were flat.  Echo 10/14/2017 LVEF 40 to 45% with diffuse hypokinesis and grade 2 DD.  She diuresed 1.9 L.  Oncologist were deciding whether or not Coumadin would be okay.  Patient does not tolerate atrial fibrillation with RVR well and aggressive use of beta-blockers and calcium channel blockers was recommended.    Past Medical History:  Diagnosis Date  . Bleeding behind the abdominal cavity 10/2017  . Chronic anticoagulation - coumadin, CHADS2VASC=6 05/17/2015  . CKD (chronic kidney disease), stage III ([place])   . Combined systolic and diastolic heart failure ([place])   . Coronary artery disease    a. s/p multiple stents - stenting x 2 to the LAD and x 1 to the LCx in 2000, rotational arthrectomy to proximal LCx and DES to LAD in 2014 and DES to LCx in 2014, along with DES to LAD for re-in-stent stenosis in 2015, and DES to ostial LAD in 2016. b. 07/2016 - orbital atherectomy & DES to mid LCx.  . Diverticulitis   . Granulosa cell carcinoma ([place])    abd; last episode was in 2009  .  Hyperlipidemia   . Hypertension   . LBBB (left bundle branch block)       . Myocardial infarction ([place]) 2002  . Obesity   . OSA on CPAP   . Ovarian ca ([place]) 2019  . Pacemaker failure    a. Prior leadless PPM with premature battery failure, being managed conservatively without replacement.  . Peripheral vascular disease ([place])   . Permanent atrial fibrillation 2013  . Renal artery stenosis ([place])   . Tachycardia-bradycardia syndrome ([place])    a. s/p leadless pacemaker ([name]) implanted by Dr [name]  . Varicose veins     Past Surgical History:  Procedure Laterality Date  . ABDOMINAL HYSTERECTOMY    . CARDIAC CATHETERIZATION  09/03/2007   EF 70%; Failed attempt at PCI to OM  . CARDIAC CATHETERIZATION  11/01/2003   EF 70%  . CARDIAC CATHETERIZATION N/A 12/14/2015   Procedure: Left Heart Cath and Coronary Angiography;  Surgeon: Burnell Blanks, MD;  Location: [place] CV LAB;  Service: Cardiovascular;  Laterality: N/A;  . CARDIOVERSION  12/31/2011   Procedure: CARDIOVERSION;  Surgeon: [name], MD;  Location: [facility] ENDOSCOPY;  Service: Cardiovascular;  Laterality: N/A;  . CARDIOVERSION N/A 12/31/2011   Procedure: CARDIOVERSION;  Surgeon: [name], MD;  Location: [facility] CATH LAB;  Service: Cardiovascular;  Laterality: N/A;  . CATARACT EXTRACTION, BILATERAL  2015  . CHOLECYSTECTOMY  1980's  . COLON SURGERY  2004   colectomy for diverticulosis  . CORONARY ANGIOPLASTY WITH STENT PLACEMENT  2000; 08/11/2012;  11/12/2012   3 + 2 LAD & CFX; 2nd CFX stent 11/12/2012  . CORONARY ATHERECTOMY N/A 07/15/2016   Procedure: Coronary Atherectomy;  Surgeon: Martinique, Peter M, MD;  Location: [place] CV LAB;  Service: Cardiovascular;  Laterality: N/A;  . CORONARY BALLOON ANGIOPLASTY N/A 07/14/2016   Procedure: Coronary Balloon Angioplasty;  Surgeon: Martinique, Peter M, MD;  Location: [place] CV LAB;  Service: Cardiovascular;  Laterality: N/A;  . CORONARY STENT INTERVENTION N/A  07/15/2016   Procedure: Coronary Stent Intervention;  Surgeon: Martinique, Peter M, MD;  Location: [place] CV LAB;  Service: Cardiovascular;  Laterality: N/A;  . FRACTIONAL FLOW RESERVE WIRE  10/07/2013   Procedure: [place];  Surgeon: [name], MD;  Location: [facility] C[facility] LAB;  Service: Cardiovascular;;  . granulosa tumor excision  2000; 2003; 2004; 2007   "all in my abdomen including small intestines, outside my ?uterus/etc" (08/11/2012)  . HERNIA REPAIR  2005   "laparoscopic"  . IR IMAGING GUIDED PORT INSERTION  10/28/2017  . LEFT HEART CATH AND CORONARY ANGIOGRAPHY N/A 07/14/2016   Procedure: Left Heart Cath and Coronary Angiography;  Surgeon: Martinique, Peter M, MD;  Location: [name] CV LAB;  Service: Cardiovascular;  Laterality: N/A;  . LEFT HEART CATHETERIZATION WITH CORONARY ANGIOGRAM N/A 11/12/2012   Procedure: LEFT HEART CATHETERIZATION WITH CORONARY ANGIOGRAM;  Surgeon: [name], MD;  Location: [facility] CATH LAB;  Service: Cardiovascular;  Laterality: N/A;  . LEFT HEART CATHETERIZATION WITH CORONARY ANGIOGRAM N/A 10/07/2013   Procedure: LEFT HEART CATHETERIZATION WITH CORONARY ANGIOGRAM;  Surgeon: [name], MD;  Location: [facility] CATH LAB;  Service: Cardiovascular;  Laterality: N/A;  . LEFT HEART CATHETERIZATION WITH CORONARY ANGIOGRAM N/A 12/14/2013   Procedure: LEFT HEART CATHETERIZATION WITH CORONARY ANGIOGRAM;  Surgeon: Sinclair Grooms, MD;  Location: [facility] CATH LAB;  Service: Cardiovascular;  Laterality: N/A;  . LEFT HEART CATHETERIZATION WITH CORONARY ANGIOGRAM N/A 05/16/2014   Procedure: LEFT HEART CATHETERIZATION WITH CORONARY ANGIOGRAM;  Surgeon: Sherren Mocha, MD;  Location: [facility] CATH LAB;  Service: Cardiovascular;  Laterality: N/A;  . PERCUTANEOUS CORONARY INTERVENTION-BALLOON ONLY  08/04/2012   Procedure: PERCUTANEOUS CORONARY INTERVENTION-BALLOON ONLY;  Surgeon: [name], MD;  Location: [facility] CATH LAB;  Service: Cardiovascular;;  .  PERCUTANEOUS CORONARY ROTOBLATOR INTERVENTION (PCI-R) N/A 08/11/2012   Procedure: PERCUTANEOUS CORONARY ROTOBLATOR INTERVENTION (PCI-R);  Surgeon: [name], MD;  Location: [facility] CATH LAB;  Service: Cardiovascular;  Laterality: N/A;  . PERCUTANEOUS CORONARY STENT INTERVENTION (PCI-S)  10/07/2013   Procedure: PERCUTANEOUS CORONARY STENT INTERVENTION (PCI-S);  Surgeon: [name], MD;  Location: [facility] CATH LAB;  Service: Cardiovascular;;  . PERMANENT PACEMAKER INSERTION N/A 03/16/2012   [name] (SJM) leadless pacemaker (LEADLESS II STUDY PATEINT)  . SALIVARY GLAND SURGERY  2000's   "had a little lump removed; granulosa related; it was benign" (08/11/2012)  . TEE WITHOUT CARDIOVERSION  12/31/2011   Procedure: TRANSESOPHAGEAL ECHOCARDIOGRAM (TEE);  Surgeon: [name], MD;  Location: [place];  Service: Cardiovascular;  Laterality: N/A;  . VARICOSE VEIN SURGERY Bilateral 1977    Current Medications: No outpatient medications have been marked as taking for the 11/24/17 encounter (Appointment) with Imogene Burn, PA-C.     Allergies:   Bee venom; Other; Amiodarone; and Prednisone   Social History   Socioeconomic History  . Marital status: Widowed    Spouse name: Not on file  . Number of children: 4  . Years of education: Not on file  . Highest education level:  Not on file  Occupational History  . Occupation: Retired Nurse, learning disability estate  Social Needs  . Financial resource strain: Not on file  . Food insecurity:    Worry: Not on file    Inability: Not on file  . Transportation needs:    Medical: Not on file    Non-medical: Not on file  Tobacco Use  . Smoking status: Former Smoker    Packs/day: 1.00    Years: 32.00    Pack years: 32.00    Types: Cigarettes    Last attempt to quit: 02/03/1974    Years since quitting: 43.8  . Smokeless tobacco: Never Used  Substance and Sexual Activity  . Alcohol use: Yes    Alcohol/week: 5.0 standard drinks    Types: 5  Glasses of wine per week    Comment: occ  . Drug use: No  . Sexual activity: Never  Lifestyle  . Physical activity:    Days per week: Not on file    Minutes per session: Not on file  . Stress: Not on file  Relationships  . Social connections:    Talks on phone: Not on file    Gets together: Not on file    Attends religious service: Not on file    Active member of club or organization: Not on file    Attends meetings of clubs or organizations: Not on file    Relationship status: Not on file  Other Topics Concern  . Not on file  Social History Narrative   Lives with family.     Family History:  The patient's ***family history includes Diabetes in her brother and father; Heart attack in her brother and father; Hypertension in her brother and father; Kidney failure in her brother; Stroke in her mother.   ROS:   Please see the history of present illness.    ROS All other systems reviewed and are negative.   PHYSICAL EXAM:   VS:  There were no vitals taken for this visit.  Physical Exam  GEN: Well nourished, well developed, in no acute distress  HEENT: normal  Neck: no JVD, carotid bruits, or masses Cardiac:RRR; no murmurs, rubs, or gallops  Respiratory:  clear to auscultation bilaterally, normal work of breathing GI: soft, nontender, nondistended, + BS Ext: without cyanosis, clubbing, or edema, Good distal pulses bilaterally MS: no deformity or atrophy  Skin: warm and dry, no rash Neuro:  Alert and Oriented x 3, Strength and sensation are intact Psych: euthymic mood, full affect  Wt Readings from Last 3 Encounters:  11/19/17 170 lb 9.6 oz (77.4 kg)  11/04/17 170 lb 9.6 oz (77.4 kg)  10/28/17 176 lb (79.8 kg)      Studies/Labs Reviewed:   EKG:  EKG is*** ordered today.  The ekg ordered today demonstrates ***  Recent Labs: 11/03/2017: B Natriuretic Peptide 373.6 11/05/2017: Magnesium 1.8 11/19/2017: ALT 22; BUN 16; Creatinine, Ser 1.04; Hemoglobin 10.6; Platelets  147; Potassium 3.4; Sodium 144   Lipid Panel    Component Value Date/Time   CHOL 132 10/08/2017 0905   TRIG 68 10/08/2017 0905   HDL 66 10/08/2017 0905   CHOLHDL 2.0 10/08/2017 0905   CHOLHDL 2.8 07/11/2016 0926   VLDL 9 07/11/2016 0926   LDLCALC 52 10/08/2017 0905    Additional studies/ records that were reviewed today include:  2D echo 9/11/2019Study Conclusions   - Left ventricle: The cavity size was normal. Wall thickness was   increased in a pattern of moderate  LVH. Septal-lateral   dyssynchrony. Systolic function was mildly to moderately reduced.   The estimated ejection fraction was in the range of 40% to 45%.   Diffuse hypokinesis. Features are consistent with a pseudonormal   left ventricular filling pattern, with concomitant abnormal   relaxation and increased filling pressure (grade 2 diastolic   dysfunction). - Aortic valve: Trileaflet; moderately calcified leaflets. There   was no stenosis. There was mild regurgitation. - Aorta: Mildly dilated ascending aorta. Ascending aortic diameter:   39 mm (S). - Mitral valve: Moderately calcified annulus. There was mild   regurgitation. - Left atrium: The atrium was moderately dilated. - Right ventricle: The cavity size was normal. Pacer wire or   catheter noted in right ventricle. Systolic function was normal. - Right atrium: The atrium was mildly dilated. - Tricuspid valve: Peak RV-RA gradient (S): 33 mm Hg. - Pulmonary arteries: PA peak pressure: 36 mm Hg (S). - Inferior vena cava: The vessel was normal in size. The   respirophasic diameter changes were in the normal range (>= 50%),   consistent with normal central venous pressure. - Pericardium, extracardiac: A trivial pericardial effusion was   identified.   Impressions:   - Nomral LV size with moderate LV hypertrophy. EF 40-45%, diffuse   hypokinesis with septal-lateral dyssynchrony consistent with LBBB   or RV pacing. Moderate diastolic dysfunction. Normal RV  size and   systolic function. Mild MR, mild AI. Mild pulmonary hypertension.       Cardiac catheterization 07/2016 prox RCA lesion, 30 %stenosed.  Mid RCA-2 lesion, 30 %stenosed.  Mid RCA-1 lesion, 40 %stenosed.  Dist RCA lesion/PLOM, 100 %stenosed.  Ost RPDA to RPDA lesion, 50 %stenosed.  Ost Cx to Prox Cx lesion, 10 %stenosed.  Ost 1st Mrg to 1st Mrg lesion, 100 %stenosed.  Ost LAD to Mid LAD lesion, 20 %stenosed.  Acute Mrg lesion, 30 %stenosed.  Ost 2nd Mrg to 2nd Mrg lesion, 60 %stenosed.  Mid LAD lesion, 30 %stenosed.  LV end diastolic pressure is normal.  Mid Cx lesion, 90 %stenosed.  Post intervention, there is a 90% residual stenosis.   1. 2 vessel obstructive CAD    - 100% CTO of OM1 with left to left collaterals.    - 90% distal LCx    - 100% distal PLOM 2. Normal LVEDP 3. Unsuccessful PCI of the distal LCx due to inability to cross the lesion with a balloon.    Plan: will resume IV heparin this pm. Plan to return to cath lab for atherectomy and stenting of the distal LCx.     Mid Cx lesion, 90 %stenosed.  A STENT PROMUS PREM MR 2.5X12 drug eluting stent was successfully placed.  Post intervention, there is a 0% residual stenosis.   1. Successful orbital atherectomy and stenting of the distal LCx with DES   Plan: continue DAPT with ASA/Plavix. Would discontinue ASA at one month. Resume Coumadin tonight. I would not bridge with IV heparin. Anticipate DC in am.       ASSESSMENT:    1. Coronary artery disease involving native coronary artery of native heart with other form of angina pectoris ([name])   2. Persistent atrial fibrillation   3. CHF (congestive heart failure), NYHA class II, acute, combined ([place])   4. Essential hypertension   5. Pacemaker - St Jude Leadless PPM      PLAN:  In order of problems listed above:  CAD status post multiple stents most recently complex PCI to the circumflex 07/2016  DAPT stopped because of life-threatening  bleed 10/2017.  Recent hospitalization with chest pain troponins flat.  Medical therapy recommended.  Persistent atrial fibrillation does not tolerate RVR on beta-blocker and calcium channel blockers.  Dr. Irish Lack discussing with oncology and surgeons possible use of Coumadin.  CHA2DS2-VASc equals 6  CHF with systolic and diastolic component diuresed 1.9 L in the hospital  Essential hypertension  Pacemaker functioning normally when checked by Dr. 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  Now on chemotherapy  Medication Adjustments/Labs and Tests Ordered: Current medicines are reviewed at length with the patient today.  Concerns regarding medicines are outlined above.  Medication changes, Labs and Tests ordered today are listed in the Patient Instructions below. There are no Patient Instructions on file for this visit.   Sumner Boast, PA-C  11/23/2017 3:57 PM    [facility] [place], [place], [place]  [address] Phone: [phone]; Fax: [phone]

## 2017-11-24 ENCOUNTER — Telehealth: Payer: Self-pay | Admitting: Interventional Cardiology

## 2017-11-24 ENCOUNTER — Ambulatory Visit: Payer: Medicare Other | Admitting: Physician Assistant

## 2017-11-24 LAB — INHIBIN B: INHIBIN B: 113.6 pg/mL — AB (ref 0.0–16.9)

## 2017-11-24 NOTE — Telephone Encounter (Signed)
New Message:   Patient called to cancel her appt patient stated that she was not feeling well today. But would like for a nurse to call her back if the nurse feel like she should come in. Please call Patient

## 2017-11-24 NOTE — Telephone Encounter (Signed)
Attempted to contact patient but there was no answer. Will try again at another time.  

## 2017-11-25 ENCOUNTER — Telehealth: Payer: Self-pay | Admitting: Oncology

## 2017-11-25 DIAGNOSIS — I2510 Atherosclerotic heart disease of native coronary artery without angina pectoris: Secondary | ICD-10-CM | POA: Diagnosis not present

## 2017-11-25 DIAGNOSIS — C569 Malignant neoplasm of unspecified ovary: Secondary | ICD-10-CM | POA: Diagnosis not present

## 2017-11-25 DIAGNOSIS — I5040 Unspecified combined systolic (congestive) and diastolic (congestive) heart failure: Secondary | ICD-10-CM | POA: Diagnosis not present

## 2017-11-25 DIAGNOSIS — I130 Hypertensive heart and chronic kidney disease with heart failure and stage 1 through stage 4 chronic kidney disease, or unspecified chronic kidney disease: Secondary | ICD-10-CM | POA: Diagnosis not present

## 2017-11-25 DIAGNOSIS — B0229 Other postherpetic nervous system involvement: Secondary | ICD-10-CM | POA: Diagnosis not present

## 2017-11-25 DIAGNOSIS — N183 Chronic kidney disease, stage 3 (moderate): Secondary | ICD-10-CM | POA: Diagnosis not present

## 2017-11-25 NOTE — Telephone Encounter (Signed)
Called Kyera back and advised her of the message from Dr. Alvy Bimler.  She verbalized understanding and then mentioned that her right leg was swollen last night.  She said it looks better this morning and will keep monitoring it and let us know if it gets worse.

## 2017-11-25 NOTE — Telephone Encounter (Signed)
She can try to increase it to BID but it takes time for it to work I recommend Dilaudid as needed for additional pain control

## 2017-11-25 NOTE — Telephone Encounter (Signed)
Patty [name] called and said the neuropathy in her right leg is very painful.  She has been taking 1 tablet of gabapentin in the morning since Sunday and is wondering if she can go to 2 tablets per day.  She said it is more painful to walk on her right leg and drive.

## 2017-11-30 DIAGNOSIS — I10 Essential (primary) hypertension: Secondary | ICD-10-CM | POA: Diagnosis not present

## 2017-11-30 DIAGNOSIS — N183 Chronic kidney disease, stage 3 (moderate): Secondary | ICD-10-CM | POA: Diagnosis not present

## 2017-11-30 DIAGNOSIS — G4733 Obstructive sleep apnea (adult) (pediatric): Secondary | ICD-10-CM | POA: Diagnosis not present

## 2017-11-30 DIAGNOSIS — E7800 Pure hypercholesterolemia, unspecified: Secondary | ICD-10-CM | POA: Diagnosis not present

## 2017-11-30 DIAGNOSIS — L609 Nail disorder, unspecified: Secondary | ICD-10-CM | POA: Diagnosis not present

## 2017-11-30 DIAGNOSIS — I5043 Acute on chronic combined systolic (congestive) and diastolic (congestive) heart failure: Secondary | ICD-10-CM | POA: Diagnosis not present

## 2017-12-01 NOTE — Telephone Encounter (Signed)
Called to follow up with the patient. She states that she is feeling better and does not wish to reschedule at this time. She wants to just schedule her regular follow up with Dr. Irish Lack. Appointment made for 02/04/18 at 2:00 PM. Patient denies any further concerns at this time.

## 2017-12-02 DIAGNOSIS — F4323 Adjustment disorder with mixed anxiety and depressed mood: Secondary | ICD-10-CM | POA: Diagnosis not present

## 2017-12-03 DIAGNOSIS — I5040 Unspecified combined systolic (congestive) and diastolic (congestive) heart failure: Secondary | ICD-10-CM | POA: Diagnosis not present

## 2017-12-03 DIAGNOSIS — N183 Chronic kidney disease, stage 3 (moderate): Secondary | ICD-10-CM | POA: Diagnosis not present

## 2017-12-03 DIAGNOSIS — B0229 Other postherpetic nervous system involvement: Secondary | ICD-10-CM | POA: Diagnosis not present

## 2017-12-03 DIAGNOSIS — I2510 Atherosclerotic heart disease of native coronary artery without angina pectoris: Secondary | ICD-10-CM | POA: Diagnosis not present

## 2017-12-03 DIAGNOSIS — C569 Malignant neoplasm of unspecified ovary: Secondary | ICD-10-CM | POA: Diagnosis not present

## 2017-12-03 DIAGNOSIS — I130 Hypertensive heart and chronic kidney disease with heart failure and stage 1 through stage 4 chronic kidney disease, or unspecified chronic kidney disease: Secondary | ICD-10-CM | POA: Diagnosis not present

## 2017-12-08 ENCOUNTER — Telehealth: Payer: Self-pay

## 2017-12-08 NOTE — Telephone Encounter (Signed)
Called back and told her she has 6 refills on Emla cream at the pharmacy if she needs it. She verbalized understanding. Instructed to call the office if needed.

## 2017-12-08 NOTE — Telephone Encounter (Signed)
She called and left a message to call her regarding refill on Emla cream.  Called back and left a message for her to call the office.

## 2017-12-09 ENCOUNTER — Telehealth: Payer: Self-pay | Admitting: Oncology

## 2017-12-09 ENCOUNTER — Ambulatory Visit: Payer: Medicare Other | Admitting: Podiatry

## 2017-12-09 DIAGNOSIS — I2510 Atherosclerotic heart disease of native coronary artery without angina pectoris: Secondary | ICD-10-CM | POA: Diagnosis not present

## 2017-12-09 DIAGNOSIS — I5040 Unspecified combined systolic (congestive) and diastolic (congestive) heart failure: Secondary | ICD-10-CM | POA: Diagnosis not present

## 2017-12-09 DIAGNOSIS — N183 Chronic kidney disease, stage 3 (moderate): Secondary | ICD-10-CM | POA: Diagnosis not present

## 2017-12-09 DIAGNOSIS — B0229 Other postherpetic nervous system involvement: Secondary | ICD-10-CM | POA: Diagnosis not present

## 2017-12-09 DIAGNOSIS — I130 Hypertensive heart and chronic kidney disease with heart failure and stage 1 through stage 4 chronic kidney disease, or unspecified chronic kidney disease: Secondary | ICD-10-CM | POA: Diagnosis not present

## 2017-12-09 DIAGNOSIS — C569 Malignant neoplasm of unspecified ovary: Secondary | ICD-10-CM | POA: Diagnosis not present

## 2017-12-09 NOTE — Telephone Encounter (Signed)
[facility] and notified her of appointments with Dr. Irish Lack on 12/16/17 and also CT scan on 12/24/17 at 9:30 with instructions to be NPO 4 hours before, drink 1st bottle of contrast at 7:30 and 2nd at 8:30.  Advised her that we will also scheduled an appointment with Dr. Denman George on 12/28/17 and will let her know the time once it is scheduled.  Explained that we need these appointments in case she is scheduled for surgery with Dr. Denman George and Dr. Excell Seltzer.  She verbalized understanding and agreement.

## 2017-12-10 ENCOUNTER — Inpatient Hospital Stay: Payer: Medicare Other

## 2017-12-10 ENCOUNTER — Inpatient Hospital Stay: Payer: Medicare Other | Attending: Hematology and Oncology

## 2017-12-10 ENCOUNTER — Telehealth: Payer: Self-pay | Admitting: *Deleted

## 2017-12-10 ENCOUNTER — Telehealth: Payer: Self-pay | Admitting: Oncology

## 2017-12-10 ENCOUNTER — Telehealth: Payer: Self-pay

## 2017-12-10 ENCOUNTER — Other Ambulatory Visit: Payer: Self-pay | Admitting: *Deleted

## 2017-12-10 ENCOUNTER — Inpatient Hospital Stay (HOSPITAL_BASED_OUTPATIENT_CLINIC_OR_DEPARTMENT_OTHER): Payer: Medicare Other | Admitting: Oncology

## 2017-12-10 VITALS — BP 166/58 | HR 60 | Temp 98.4°F | Resp 18 | Ht 60.0 in | Wt 169.1 lb

## 2017-12-10 DIAGNOSIS — R110 Nausea: Secondary | ICD-10-CM | POA: Insufficient documentation

## 2017-12-10 DIAGNOSIS — Z955 Presence of coronary angioplasty implant and graft: Secondary | ICD-10-CM | POA: Insufficient documentation

## 2017-12-10 DIAGNOSIS — C569 Malignant neoplasm of unspecified ovary: Secondary | ICD-10-CM

## 2017-12-10 DIAGNOSIS — Z79899 Other long term (current) drug therapy: Secondary | ICD-10-CM

## 2017-12-10 DIAGNOSIS — D3910 Neoplasm of uncertain behavior of unspecified ovary: Secondary | ICD-10-CM

## 2017-12-10 DIAGNOSIS — I2510 Atherosclerotic heart disease of native coronary artery without angina pectoris: Secondary | ICD-10-CM | POA: Insufficient documentation

## 2017-12-10 DIAGNOSIS — Z7901 Long term (current) use of anticoagulants: Secondary | ICD-10-CM | POA: Diagnosis not present

## 2017-12-10 DIAGNOSIS — I739 Peripheral vascular disease, unspecified: Secondary | ICD-10-CM | POA: Diagnosis not present

## 2017-12-10 DIAGNOSIS — Z5111 Encounter for antineoplastic chemotherapy: Secondary | ICD-10-CM | POA: Insufficient documentation

## 2017-12-10 DIAGNOSIS — E669 Obesity, unspecified: Secondary | ICD-10-CM | POA: Diagnosis not present

## 2017-12-10 DIAGNOSIS — I4821 Permanent atrial fibrillation: Secondary | ICD-10-CM | POA: Insufficient documentation

## 2017-12-10 DIAGNOSIS — R197 Diarrhea, unspecified: Secondary | ICD-10-CM | POA: Diagnosis not present

## 2017-12-10 DIAGNOSIS — I130 Hypertensive heart and chronic kidney disease with heart failure and stage 1 through stage 4 chronic kidney disease, or unspecified chronic kidney disease: Secondary | ICD-10-CM | POA: Insufficient documentation

## 2017-12-10 DIAGNOSIS — I5042 Chronic combined systolic (congestive) and diastolic (congestive) heart failure: Secondary | ICD-10-CM | POA: Diagnosis not present

## 2017-12-10 DIAGNOSIS — I447 Left bundle-branch block, unspecified: Secondary | ICD-10-CM | POA: Diagnosis not present

## 2017-12-10 DIAGNOSIS — G4733 Obstructive sleep apnea (adult) (pediatric): Secondary | ICD-10-CM | POA: Diagnosis not present

## 2017-12-10 DIAGNOSIS — E785 Hyperlipidemia, unspecified: Secondary | ICD-10-CM | POA: Insufficient documentation

## 2017-12-10 DIAGNOSIS — Z950 Presence of cardiac pacemaker: Secondary | ICD-10-CM | POA: Diagnosis not present

## 2017-12-10 DIAGNOSIS — I252 Old myocardial infarction: Secondary | ICD-10-CM | POA: Diagnosis not present

## 2017-12-10 LAB — CBC WITH DIFFERENTIAL/PLATELET
Abs Immature Granulocytes: 0.01 10*3/uL (ref 0.00–0.07)
BASOS ABS: 0 10*3/uL (ref 0.0–0.1)
Basophils Relative: 0 %
EOS PCT: 1 %
Eosinophils Absolute: 0 10*3/uL (ref 0.0–0.5)
HCT: 36.5 % (ref 36.0–46.0)
Hemoglobin: 11.5 g/dL — ABNORMAL LOW (ref 12.0–15.0)
Immature Granulocytes: 0 %
Lymphocytes Relative: 16 %
Lymphs Abs: 1 10*3/uL (ref 0.7–4.0)
MCH: 32.3 pg (ref 26.0–34.0)
MCHC: 31.5 g/dL (ref 30.0–36.0)
MCV: 102.5 fL — AB (ref 80.0–100.0)
MONO ABS: 0.6 10*3/uL (ref 0.1–1.0)
Monocytes Relative: 10 %
NEUTROS ABS: 4.5 10*3/uL (ref 1.7–7.7)
NRBC: 0 % (ref 0.0–0.2)
Neutrophils Relative %: 73 %
Platelets: 130 10*3/uL — ABNORMAL LOW (ref 150–400)
RBC: 3.56 MIL/uL — AB (ref 3.87–5.11)
RDW: 19.9 % — AB (ref 11.5–15.5)
WBC: 6.1 10*3/uL (ref 4.0–10.5)

## 2017-12-10 LAB — COMPREHENSIVE METABOLIC PANEL
ALT: 24 U/L (ref 0–44)
AST: 24 U/L (ref 15–41)
Albumin: 3.2 g/dL — ABNORMAL LOW (ref 3.5–5.0)
Alkaline Phosphatase: 130 U/L — ABNORMAL HIGH (ref 38–126)
Anion gap: 8 (ref 5–15)
BILIRUBIN TOTAL: 1 mg/dL (ref 0.3–1.2)
BUN: 17 mg/dL (ref 8–23)
CHLORIDE: 104 mmol/L (ref 98–111)
CO2: 31 mmol/L (ref 22–32)
Calcium: 9 mg/dL (ref 8.9–10.3)
Creatinine, Ser: 0.98 mg/dL (ref 0.44–1.00)
GFR, EST AFRICAN AMERICAN: 59 mL/min — AB (ref >60)
GFR, EST NON AFRICAN AMERICAN: 51 mL/min — AB (ref >60)
Glucose, Bld: 120 mg/dL — ABNORMAL HIGH (ref 70–99)
POTASSIUM: 3.3 mmol/L — AB (ref 3.5–5.1)
Sodium: 143 mmol/L (ref 135–145)
Total Protein: 6.5 g/dL (ref 6.5–8.1)

## 2017-12-10 MED ORDER — SODIUM CHLORIDE 0.9% FLUSH
10.0000 mL | Freq: Once | INTRAVENOUS | Status: AC
  Administered 2017-12-10: 10 mL
  Filled 2017-12-10: qty 10

## 2017-12-10 MED ORDER — HEPARIN SOD (PORK) LOCK FLUSH 100 UNIT/ML IV SOLN
500.0000 [IU] | Freq: Once | INTRAVENOUS | Status: AC
  Administered 2017-12-10: 500 [IU]
  Filled 2017-12-10: qty 5

## 2017-12-10 MED ORDER — SODIUM CHLORIDE 0.9 % IV SOLN
Freq: Once | INTRAVENOUS | Status: AC
  Administered 2017-12-10: 12:00:00 via INTRAVENOUS
  Filled 2017-12-10: qty 1000

## 2017-12-10 MED ORDER — ONDANSETRON HCL 8 MG PO TABS
8.0000 mg | ORAL_TABLET | Freq: Once | ORAL | Status: AC
  Administered 2017-12-10: 8 mg via ORAL

## 2017-12-10 MED ORDER — ONDANSETRON HCL 8 MG PO TABS
ORAL_TABLET | ORAL | Status: AC
  Filled 2017-12-10: qty 1

## 2017-12-10 NOTE — Telephone Encounter (Signed)
She called and left a message with the after hours number regarding appt today and having diarrhea. Instructed to come into appts today. She verbalized understanding.

## 2017-12-10 NOTE — Telephone Encounter (Signed)
Called [facility] and canceled appointment on 12/16/17 with Dr. Irish Lack.  Also rescheduled CT scan for 01/04/18.

## 2017-12-10 NOTE — Telephone Encounter (Signed)
Received notice of patient call at 0800 today to answering service for significant diarrhea and nausea. Asking if she should still come in. Called patient and instructed her to repeat her imodium dose now and come in. Can give IVF instead of chemo if needed. She has a friend who can drive her. Instructed her to ask for w/c upon arrival.

## 2017-12-10 NOTE — Progress Notes (Signed)
  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  She is currently being treated with single agent carboplatin.  She is scheduled for cycle 3 today.  She reports no nausea with chemotherapy. She noted the acute onset of nausea and diarrhea yesterday.  She reports 7-10 watery stools yesterday.  2 so far today.  She is taking Imodium.  No vomiting.  No bleeding.  She has diarrhea after drinking.  Objective:  Vital signs in last 24 hours:  Blood pressure (!) 166/58, pulse 60, temperature 98.4 F (36.9 C), temperature source Oral, resp. rate 18, height 5' (1.524 m), weight 169 lb 1.6 oz (76.7 kg), SpO2 96 %.    HEENT: The tongue and mucous membranes are dry.  No thrush Resp: Lungs clear bilaterally Cardio: Regular rate and rhythm GI: Nontender, no mass, no hepatosplenomegaly, soft, bowel sounds are present Vascular: No leg edema   Portacath/PICC-without erythema  Lab Results:  Lab Results  Component Value Date   WBC 6.1 12/10/2017   HGB 11.5 (L) 12/10/2017   HCT 36.5 12/10/2017   MCV 102.5 (H) 12/10/2017   PLT 130 (L) 12/10/2017   NEUTROABS 4.5 12/10/2017    CMP  Lab Results  Component Value Date   NA 144 11/19/2017   K 3.4 (L) 11/19/2017   CL 103 11/19/2017   CO2 32 11/19/2017   GLUCOSE 129 (H) 11/19/2017   BUN 16 11/19/2017   CREATININE 1.04 (H) 11/19/2017   CALCIUM 9.0 11/19/2017   PROT 6.3 (L) 11/19/2017   ALBUMIN 2.9 (L) 11/19/2017   AST 29 11/19/2017   ALT 22 11/19/2017   ALKPHOS 115 11/19/2017   BILITOT 0.7 11/19/2017   GFRNONAA 48 (L) 11/19/2017   GFRAA 55 (L) 11/19/2017     Medications: I have reviewed the patient's current medications.   Assessment/Plan: 1.  Ovarian cancer- these progression September 2019, with single agent carboplatin beginning 10/29/2017 cycle 2 given 11/19/2017  2.  Nausea/diarrhea-acute onset beginning  12/09/2017  Disposition: Patty [name] presents today for cycle 3 carboplatin.  She has developed acute nausea and diarrhea.  I suspect this is unrelated to the carboplatin or ovarian cancer diagnosis.  She may have a viral gastroenteritis or C. difficile infection. She will receive intravenous hydration today.  I recommended she hold furosemide.  She will return for repeat assessment and intravenous hydration as indicated on 12/11/2017.  She will be rescheduled for cycle 3 carboplatin 12/17/2017.  25 minutes were spent with the patient today.  The majority of the time was used for counseling and coordination of care.  Betsy Coder, MD  12/10/2017  11:09 AM

## 2017-12-10 NOTE — Patient Instructions (Signed)
Hypokalemia Hypokalemia means that the amount of potassium in the blood is lower than normal.Potassium is a chemical that helps regulate the amount of fluid in the body (electrolyte). It also stimulates muscle tightening (contraction) and helps nerves work properly.Normally, most of the body's potassium is inside of cells, and only a very small amount is in the blood. Because the amount in the blood is so small, minor changes to potassium levels in the blood can be life-threatening. What are the causes? This condition may be caused by:  Antibiotic medicine.  Diarrhea or vomiting. Taking too much of a medicine that helps you have a bowel movement (laxative) can cause diarrhea and lead to hypokalemia.  Chronic kidney disease (CKD).  Medicines that help the body get rid of excess fluid (diuretics).  Eating disorders, such as bulimia.  Low magnesium levels in the body.  Sweating a lot.  What are the signs or symptoms? Symptoms of this condition include:  Weakness.  Constipation.  Fatigue.  Muscle cramps.  Mental confusion.  Skipped heartbeats or irregular heartbeat (palpitations).  Tingling or numbness.  How is this diagnosed? This condition is diagnosed with a blood test. How is this treated? Hypokalemia can be treated by taking potassium supplements by mouth or adjusting the medicines that you take. Treatment may also include eating more foods that contain a lot of potassium. If your potassium level is very low, you may need to get potassium through an IV tube in one of your veins and be monitored in the hospital. Follow these instructions at home:  Take over-the-counter and prescription medicines only as told by your health care provider. This includes vitamins and supplements.  Eat a healthy diet. A healthy diet includes fresh fruits and vegetables, whole grains, healthy fats, and lean proteins.  If instructed, eat more foods that contain a lot of potassium, such  as: ? Nuts, such as peanuts and pistachios. ? Seeds, such as sunflower seeds and pumpkin seeds. ? Peas, lentils, and lima beans. ? Whole grain and bran cereals and breads. ? Fresh fruits and vegetables, such as apricots, avocado, bananas, cantaloupe, kiwi, oranges, tomatoes, asparagus, and potatoes. ? Orange juice. ? Tomato juice. ? Red meats. ? Yogurt.  Keep all follow-up visits as told by your health care provider. This is important. Contact a health care provider if:  You have weakness that gets worse.  You feel your heart pounding or racing.  You vomit.  You have diarrhea.  You have diabetes (diabetes mellitus) and you have trouble keeping your blood sugar (glucose) in your target range. Get help right away if:  You have chest pain.  You have shortness of breath.  You have vomiting or diarrhea that lasts for more than 2 days.  You faint. This information is not intended to replace advice given to you by your health care provider. Make sure you discuss any questions you have with your health care provider. Document Released: 01/20/2005 Document Revised: 09/08/2015 Document Reviewed: 09/08/2015 Elsevier Interactive Patient Education  2018 [employer]   Dehydration, Adult Dehydration is when there is not enough fluid or water in your body. This happens when you lose more fluids than you take in. Dehydration can range from mild to very bad. It should be treated right away to keep it from getting very bad. Symptoms of mild dehydration may include:  Thirst.  Dry lips.  Slightly dry mouth.  Dry, warm skin.  Dizziness. Symptoms of moderate dehydration may include:  Very dry mouth.  Muscle   cramps.  Dark pee (urine). Pee may be the color of tea.  Your body making less pee.  Your eyes making fewer tears.  Heartbeat that is uneven or faster than normal (palpitations).  Headache.  Light-headedness, especially when you stand up from sitting.  Fainting  (syncope). Symptoms of very bad dehydration may include:  Changes in skin, such as: ? Cold and clammy skin. ? Blotchy (mottled) or pale skin. ? Skin that does not quickly return to normal after being lightly pinched and let go (poor skin turgor).  Changes in body fluids, such as: ? Feeling very thirsty. ? Your eyes making fewer tears. ? Not sweating when body temperature is high, such as in hot weather. ? Your body making very little pee.  Changes in vital signs, such as: ? Weak pulse. ? Pulse that is more than 100 beats a minute when you are sitting still. ? Fast breathing. ? Low blood pressure.  Other changes, such as: ? Sunken eyes. ? Cold hands and feet. ? Confusion. ? Lack of energy (lethargy). ? Trouble waking up from sleep. ? Short-term weight loss. ? Unconsciousness. Follow these instructions at home:  If told by your doctor, drink an ORS: ? Make an ORS by using instructions on the package. ? Start by drinking small amounts, about  cup (120 mL) every 5-10 minutes. ? Slowly drink more until you have had the amount that your doctor said to have.  Drink enough clear fluid to keep your pee clear or pale yellow. If you were told to drink an ORS, finish the ORS first, then start slowly drinking clear fluids. Drink fluids such as: ? Water. Do not drink only water by itself. Doing that can make the salt (sodium) level in your body get too low (hyponatremia). ? Ice chips. ? Fruit juice that you have added water to (diluted). ? Low-calorie sports drinks.  Avoid: ? Alcohol. ? Drinks that have a lot of sugar. These include high-calorie sports drinks, fruit juice that does not have water added, and soda. ? Caffeine. ? Foods that are greasy or have a lot of fat or sugar.  Take over-the-counter and prescription medicines only as told by your doctor.  Do not take salt tablets. Doing that can make the salt level in your body get too high (hypernatremia).  Eat foods that  have minerals (electrolytes). Examples include bananas, oranges, potatoes, tomatoes, and spinach.  Keep all follow-up visits as told by your doctor. This is important. Contact a doctor if:  You have belly (abdominal) pain that: ? Gets worse. ? Stays in one area (localizes).  You have a rash.  You have a stiff neck.  You get angry or annoyed more easily than normal (irritability).  You are more sleepy than normal.  You have a harder time waking up than normal.  You feel: ? Weak. ? Dizzy. ? Very thirsty.  You have peed (urinated) only a small amount of very dark pee during 6-8 hours. Get help right away if:  You have symptoms of very bad dehydration.  You cannot drink fluids without throwing up (vomiting).  Your symptoms get worse with treatment.  You have a fever.  You have a very bad headache.  You are throwing up or having watery poop (diarrhea) and it: ? Gets worse. ? Does not go away.  You have blood or something green (bile) in your throw-up.  You have blood in your poop (stool). This may cause poop to look black and tarry.    You have not peed in 6-8 hours.  You pass out (faint).  Your heart rate when you are sitting still is more than 100 beats a minute.  You have trouble breathing. This information is not intended to replace advice given to you by your health care provider. Make sure you discuss any questions you have with your health care provider. Document Released: 11/16/2008 Document Revised: 08/10/2015 Document Reviewed: 03/16/2015 Elsevier Interactive Patient Education  2018 [employer]  

## 2017-12-10 NOTE — Telephone Encounter (Signed)
Scheduled appt per 11/7 los - left message with appt date and time.

## 2017-12-11 ENCOUNTER — Other Ambulatory Visit: Payer: Self-pay

## 2017-12-11 ENCOUNTER — Inpatient Hospital Stay: Payer: Medicare Other

## 2017-12-11 ENCOUNTER — Telehealth: Payer: Self-pay | Admitting: Oncology

## 2017-12-11 ENCOUNTER — Inpatient Hospital Stay (HOSPITAL_BASED_OUTPATIENT_CLINIC_OR_DEPARTMENT_OTHER): Payer: Medicare Other | Admitting: Medical

## 2017-12-11 VITALS — BP 134/46 | HR 60 | Temp 98.1°F | Resp 18 | Ht 60.0 in | Wt 170.0 lb

## 2017-12-11 DIAGNOSIS — C569 Malignant neoplasm of unspecified ovary: Secondary | ICD-10-CM

## 2017-12-11 DIAGNOSIS — Z5111 Encounter for antineoplastic chemotherapy: Secondary | ICD-10-CM | POA: Diagnosis not present

## 2017-12-11 DIAGNOSIS — D3910 Neoplasm of uncertain behavior of unspecified ovary: Secondary | ICD-10-CM

## 2017-12-11 DIAGNOSIS — R110 Nausea: Secondary | ICD-10-CM

## 2017-12-11 DIAGNOSIS — Z79899 Other long term (current) drug therapy: Secondary | ICD-10-CM | POA: Diagnosis not present

## 2017-12-11 DIAGNOSIS — I130 Hypertensive heart and chronic kidney disease with heart failure and stage 1 through stage 4 chronic kidney disease, or unspecified chronic kidney disease: Secondary | ICD-10-CM | POA: Diagnosis not present

## 2017-12-11 DIAGNOSIS — E876 Hypokalemia: Secondary | ICD-10-CM | POA: Diagnosis not present

## 2017-12-11 DIAGNOSIS — R197 Diarrhea, unspecified: Secondary | ICD-10-CM | POA: Diagnosis not present

## 2017-12-11 DIAGNOSIS — Z95828 Presence of other vascular implants and grafts: Secondary | ICD-10-CM

## 2017-12-11 LAB — CBC WITH DIFFERENTIAL (CANCER CENTER ONLY)
ABS IMMATURE GRANULOCYTES: 0.01 10*3/uL (ref 0.00–0.07)
Basophils Absolute: 0 10*3/uL (ref 0.0–0.1)
Basophils Relative: 0 %
Eosinophils Absolute: 0.1 10*3/uL (ref 0.0–0.5)
Eosinophils Relative: 1 %
HCT: 33.9 % — ABNORMAL LOW (ref 36.0–46.0)
HEMOGLOBIN: 10.7 g/dL — AB (ref 12.0–15.0)
Immature Granulocytes: 0 %
LYMPHS PCT: 18 %
Lymphs Abs: 0.9 10*3/uL (ref 0.7–4.0)
MCH: 32.5 pg (ref 26.0–34.0)
MCHC: 31.6 g/dL (ref 30.0–36.0)
MCV: 103 fL — ABNORMAL HIGH (ref 80.0–100.0)
Monocytes Absolute: 0.5 10*3/uL (ref 0.1–1.0)
Monocytes Relative: 9 %
NEUTROS ABS: 3.7 10*3/uL (ref 1.7–7.7)
NEUTROS PCT: 72 %
Platelet Count: 128 10*3/uL — ABNORMAL LOW (ref 150–400)
RBC: 3.29 MIL/uL — AB (ref 3.87–5.11)
RDW: 20.1 % — ABNORMAL HIGH (ref 11.5–15.5)
WBC: 5.1 10*3/uL (ref 4.0–10.5)
nRBC: 0 % (ref 0.0–0.2)

## 2017-12-11 LAB — CMP (CANCER CENTER ONLY)
ALT: 36 U/L (ref 0–44)
ANION GAP: 7 (ref 5–15)
AST: 44 U/L — AB (ref 15–41)
Albumin: 2.8 g/dL — ABNORMAL LOW (ref 3.5–5.0)
Alkaline Phosphatase: 164 U/L — ABNORMAL HIGH (ref 38–126)
BUN: 14 mg/dL (ref 8–23)
CHLORIDE: 108 mmol/L (ref 98–111)
CO2: 29 mmol/L (ref 22–32)
Calcium: 8.5 mg/dL — ABNORMAL LOW (ref 8.9–10.3)
Creatinine: 0.95 mg/dL (ref 0.44–1.00)
GFR, EST NON AFRICAN AMERICAN: 53 mL/min — AB (ref >60)
Glucose, Bld: 109 mg/dL — ABNORMAL HIGH (ref 70–99)
POTASSIUM: 3.7 mmol/L (ref 3.5–5.1)
Sodium: 144 mmol/L (ref 135–145)
Total Bilirubin: 1.1 mg/dL (ref 0.3–1.2)
Total Protein: 6.1 g/dL — ABNORMAL LOW (ref 6.5–8.1)

## 2017-12-11 LAB — MAGNESIUM: MAGNESIUM: 1.7 mg/dL (ref 1.7–2.4)

## 2017-12-11 LAB — INHIBIN B: INHIBIN B: 111.9 pg/mL — AB (ref 0.0–16.9)

## 2017-12-11 MED ORDER — ONDANSETRON HCL 4 MG/2ML IJ SOLN
4.0000 mg | Freq: Once | INTRAMUSCULAR | Status: AC
  Administered 2017-12-11: 4 mg via INTRAVENOUS

## 2017-12-11 MED ORDER — SODIUM CHLORIDE 0.9 % IV SOLN
INTRAVENOUS | Status: DC
  Administered 2017-12-11: 10:00:00 via INTRAVENOUS
  Filled 2017-12-11: qty 250

## 2017-12-11 MED ORDER — SODIUM CHLORIDE 0.9% FLUSH
10.0000 mL | INTRAVENOUS | Status: DC | PRN
  Administered 2017-12-11: 10 mL via INTRAVENOUS
  Filled 2017-12-11: qty 10

## 2017-12-11 MED ORDER — HEPARIN SOD (PORK) LOCK FLUSH 100 UNIT/ML IV SOLN
500.0000 [IU] | Freq: Once | INTRAVENOUS | Status: AC
  Administered 2017-12-11: 500 [IU] via INTRAVENOUS
  Filled 2017-12-11: qty 5

## 2017-12-11 MED ORDER — PROCHLORPERAZINE EDISYLATE 10 MG/2ML IJ SOLN
5.0000 mg | Freq: Once | INTRAMUSCULAR | Status: DC
  Filled 2017-12-11: qty 2

## 2017-12-11 MED ORDER — ONDANSETRON HCL 4 MG/2ML IJ SOLN
INTRAMUSCULAR | Status: AC
  Filled 2017-12-11: qty 2

## 2017-12-11 NOTE — Telephone Encounter (Signed)
[facility] and left a message with time and date of rescheduled CT scan on 01/04/18 and appointment with Dr. Denman George on 01/06/18.

## 2017-12-11 NOTE — Patient Instructions (Signed)
Nausea, Adult  Nausea is the feeling of an upset stomach or having to vomit. Nausea on its own is not usually a serious concern, but it may be an early sign of a more serious medical problem. As nausea gets worse, it can lead to vomiting. If vomiting develops, or if you are not able to drink enough fluids, you are at risk of becoming dehydrated. Dehydration can make you tired and thirsty, cause you to have a dry mouth, and decrease how often you urinate. Older adults and people with other diseases or a weak immune system are at higher risk for dehydration. The main goals of treating your nausea are:   To limit repeated nausea episodes.   To prevent vomiting and dehydration.    Follow these instructions at home:  Follow instructions from your health care provider about how to care for yourself at home.  Eating and drinking  Follow these recommendations as told by your health care provider:   Take an oral rehydration solution (ORS). This is a drink that is sold at pharmacies and retail stores.   Drink clear fluids in small amounts as you are able. Clear fluids include water, ice chips, diluted fruit juice, and low-calorie sports drinks.   Eat bland, easy-to-digest foods in small amounts as you are able. These foods include bananas, applesauce, rice, lean meats, toast, and crackers.   Avoid drinking fluids that contain a lot of sugar or caffeine, such as energy drinks, sports drinks, and soda.   Avoid alcohol.   Avoid spicy or fatty foods.    General instructions   Drink enough fluid to keep your urine clear or pale yellow.   Wash your hands often. If soap and water are not available, use hand sanitizer.   Make sure that all people in your household wash their hands well and often.   Rest at home while you recover.   Take over-the-counter and prescription medicines only as told by your health care provider.   Breathe slowly and deeply when you feel nauseous.   Watch your condition for any  changes.   Keep all follow-up visits as told by your health care provider. This is important.  Contact a health care provider if:   You have a headache.   You have new symptoms.   Your nausea gets worse.   You have a fever.   You feel light-headed or dizzy.   You vomit.   You cannot keep fluids down.  Get help right away if:   You have pain in your chest, neck, arm, or jaw.   You feel extremely weak or you faint.   You have vomit that is bright red or looks like coffee grounds.   You have bloody or black stools or stools that look like tar.   You have a severe headache, a stiff neck, or both.   You have severe pain, cramping, or bloating in your abdomen.   You have a rash.   You have difficulty breathing or are breathing very quickly.   Your heart is beating very quickly.   Your skin feels cold and clammy.   You feel confused.   You have pain when you urinate.   You have signs of dehydration, such as:  ? Dark urine, very little, or no urine.  ? Cracked lips.  ? Dry mouth.  ? Sunken eyes.  ? Sleepiness.  ? Weakness.  These symptoms may represent a serious problem that is an emergency. Do not wait   to see if the symptoms will go away. Get medical help right away. Call your local emergency services (911 in the [place] Do not drive yourself to the hospital.  This information is not intended to replace advice given to you by your health care provider. Make sure you discuss any questions you have with your health care provider.  Document Released: 02/28/2004 Document Revised: 06/25/2015 Document Reviewed: 09/26/2014  Elsevier Interactive Patient Education  2018 [employer]

## 2017-12-11 NOTE — Telephone Encounter (Signed)
Patty [name] called back and verified her appointment.  She also mentioned that she is having diarrhea again and asked what to do over the weekend if she needs help.  Advised her to call the [phone] number and ask to speak to the doctor on call or to go to the ER.  Also reviewed how to take Imodium as needed.

## 2017-12-14 ENCOUNTER — Telehealth: Payer: Self-pay | Admitting: Medical

## 2017-12-14 NOTE — Telephone Encounter (Signed)
No los per 11/8 °

## 2017-12-15 NOTE — Progress Notes (Signed)
[facility] Progress Note   Patty [name] [id number] [birth year] 84 y.o.  Anhar Mcdermott Medico is managed by Dr. Heath Lark  Actively treated with chemotherapy/immunotherapy: yes  Current Therapy: Carboplatin  Last Treated: 11/19/2017 (cycle 2)  Assessment: Plan:    Nausea without vomiting - Plan: prochlorperazine (COMPAZINE) injection 5 mg  Hypokalemia  Granulosa cell tumor of ovary, unspecified laterality   Nausea: The patient was given Compazine 5 mg IV x1.  She also has a prescription for Zofran 8 mg and Compazine 10 mg at home.  She was instructed to use these as needed.  Hypokalemia: A chemistry panel returned showing a potassium that was improved at 3.7.  The patient was instructed to continue taking potassium as prescribed.  Granulosis cell tumor of the ovary: The patient is status post cycle 2 of single agent carboplatin which was dosed on 11/19/2017.  She is to return on 12/17/2017 for follow-up.  Please see After Visit Summary for patient specific instructions.  Future Appointments  Date Time Provider [place]  12/17/2017  8:15 AM [facility] LAB 2 [facility] None  12/17/2017  8:30 AM Curcio, Roselie Awkward, NP [facility] None  12/17/2017  9:15 AM [facility] INFUSION [facility] None  12/21/2017  3:30 PM Marzetta Board, DPM [facility]  01/04/2018  9:15 AM [facility] LAB 2 [facility] None  01/04/2018  9:30 AM [facility] [place] FLUSH [facility] None  01/04/2018 10:30 AM [facility] 1 [facility] [place]  01/05/2018  8:00 AM Heath Lark, MD [facility] None  01/05/2018  9:00 AM [facility] INFUSION [facility] None  01/06/2018 10:15 AM Everitt Amber, MD [facility] None  02/04/2018  2:00 PM [name], MD [facility]  02/08/2018 10:25 AM [facility] DEVICE REMOTES [facility]    No orders of the defined types were placed in this encounter.      Subjective:   Patient ID:  Patty [name] is a 84 y.o. (DOB  [birth year]) female.  Chief Complaint:  Chief Complaint  Patient presents with  . Nausea  . Diarrhea    HPI Patty [name] is an 84 year old female with a history of a granulosa cell tumor of the ovary who is managed by Dr. Heath Lark.  She is status post cycle 2 of single agent carboplatin which was dosed on 11/19/2017.  She was seen yesterday by Dr. Dominica Severin B. Sherrill for diarrhea.  She is states that she had a couple of episodes of light diarrhea yesterday and has had none thus far today.  She has not taken Lasix since Wednesday morning.  She is taking 1 spironolactone last evening when she was having chest tightness after having IV fluids yesterday.  She was in the hospital for 3 days after cycle 1 of single agent carboplatin when she was diagnosed with congestive heart failure.  She was up to the bathroom once last evening.  She reports having little urine production this morning.  She is having nausea but has had no vomiting since Wednesday.  She is concerned that she could developed fluid overload.  Medications: I have reviewed the patient's current medications.  Allergies:  Allergies  Allergen Reactions  . Bee Venom Anaphylaxis  . Other Nausea And Vomiting and Other (See Comments)    Pain medications cause severe vomiting. Tolerated slow IV morphine drip  . Amiodarone Nausea Only  . Prednisone Other (See Comments)    "Rapid Heart Beat"    Past Medical History:  Diagnosis Date  . Bleeding behind the abdominal cavity 10/2017  .  Chronic anticoagulation - coumadin, CHADS2VASC=6 05/17/2015  . CKD (chronic kidney disease), stage III ([place])   . Combined systolic and diastolic heart failure ([place])   . Coronary artery disease    a. s/p multiple stents - stenting x 2 to the LAD and x 1 to the LCx in 2000, rotational arthrectomy to proximal LCx and DES to LAD in 2014 and DES to LCx in 2014, along with DES to LAD for re-in-stent stenosis in 2015, and DES to ostial LAD in 2016. b. 07/2016 -  orbital atherectomy & DES to mid LCx.  . Diverticulitis   . Granulosa cell carcinoma ([place])    abd; last episode was in 2009  . Hyperlipidemia   . Hypertension   . LBBB (left bundle branch block)       . Myocardial infarction ([place]) 2002  . Obesity   . OSA on CPAP   . Ovarian ca ([place]) 2019  . Pacemaker failure    a. Prior leadless PPM with premature battery failure, being managed conservatively without replacement.  . Peripheral vascular disease ([place])   . Permanent atrial fibrillation 2013  . Renal artery stenosis ([place])   . Tachycardia-bradycardia syndrome ([place])    a. s/p leadless pacemaker ([name]) implanted by Dr [name]  . Varicose veins     Past Surgical History:  Procedure Laterality Date  . ABDOMINAL HYSTERECTOMY    . CARDIAC CATHETERIZATION  09/03/2007   EF 70%; Failed attempt at PCI to OM  . CARDIAC CATHETERIZATION  11/01/2003   EF 70%  . CARDIAC CATHETERIZATION N/A 12/14/2015   Procedure: Left Heart Cath and Coronary Angiography;  Surgeon: Burnell Blanks, MD;  Location: [place] CV LAB;  Service: Cardiovascular;  Laterality: N/A;  . CARDIOVERSION  12/31/2011   Procedure: CARDIOVERSION;  Surgeon: [name], MD;  Location[facility] ENDOSCOPY;  Service: Cardiovascular;  Laterality: N/A;  . CARDIOVERSION N/A 12/31/2011   Procedure: CARDIOVERSION;  Surgeon: [name], MD;  Location: [facility] CATH LAB;  Service: Cardiovascular;  Laterality: N/A;  . CATARACT EXTRACTION, BILATERAL  2015  . CHOLECYSTECTOMY  1980's  . COLON SURGERY  2004   colectomy for diverticulosis  . CORONARY ANGIOPLASTY WITH STENT PLACEMENT  2000; 08/11/2012; 11/12/2012   3 + 2 LAD & CFX; 2nd CFX stent 11/12/2012  . CORONARY ATHERECTOMY N/A 07/15/2016   Procedure: Coronary Atherectomy;  Surgeon: Martinique, Peter M, MD;  Location: [place] CV LAB;  Service: Cardiovascular;  Laterality: N/A;  . CORONARY BALLOON ANGIOPLASTY N/A 07/14/2016   Procedure: Coronary Balloon Angioplasty;  Surgeon:  Martinique, Peter M, MD;  Location: [place] CV LAB;  Service: Cardiovascular;  Laterality: N/A;  . CORONARY STENT INTERVENTION N/A 07/15/2016   Procedure: Coronary Stent Intervention;  Surgeon: Martinique, Peter M, MD;  Location: [place] CV LAB;  Service: Cardiovascular;  Laterality: N/A;  . FRACTIONAL FLOW RESERVE WIRE  10/07/2013   Procedure: [place];  Surgeon: [name], MD;  Location: [facility] CATH LAB;  Service: Cardiovascular;;  . granulosa tumor excision  2000; 2003; 2004; 2007   "all in my abdomen including small intestines, outside my ?uterus/etc" (08/11/2012)  . HERNIA REPAIR  2005   "laparoscopic"  . IR IMAGING GUIDED PORT INSERTION  10/28/2017  . LEFT HEART CATH AND CORONARY ANGIOGRAPHY N/A 07/14/2016   Procedure: Left Heart Cath and Coronary Angiography;  Surgeon: Martinique, Peter M, MD;  Location: [place] CV LAB;  Service: Cardiovascular;  Laterality: N/A;  . LEFT HEART CATHETERIZATION WITH CORONARY ANGIOGRAM  N/A 11/12/2012   Procedure: LEFT HEART CATHETERIZATION WITH CORONARY ANGIOGRAM;  Surgeon: [name], MD;  Location: [facility] CATH LAB;  Service: Cardiovascular;  Laterality: N/A;  . LEFT HEART CATHETERIZATION WITH CORONARY ANGIOGRAM N/A 10/07/2013   Procedure: LEFT HEART CATHETERIZATION WITH CORONARY ANGIOGRAM;  Surgeon: [name], MD;  Location: [facility] CATH LAB;  Service: Cardiovascular;  Laterality: N/A;  . LEFT HEART CATHETERIZATION WITH CORONARY ANGIOGRAM N/A 12/14/2013   Procedure: LEFT HEART CATHETERIZATION WITH CORONARY ANGIOGRAM;  Surgeon: Sinclair Grooms, MD;  Location: [facility] CATH LAB;  Service: Cardiovascular;  Laterality: N/A;  . LEFT HEART CATHETERIZATION WITH CORONARY ANGIOGRAM N/A 05/16/2014   Procedure: LEFT HEART CATHETERIZATION WITH CORONARY ANGIOGRAM;  Surgeon: Sherren Mocha, MD;  Location: [facility] CATH LAB;  Service: Cardiovascular;  Laterality: N/A;  . PERCUTANEOUS CORONARY INTERVENTION-BALLOON ONLY  08/04/2012   Procedure: PERCUTANEOUS  CORONARY INTERVENTION-BALLOON ONLY;  Surgeon: [name], MD;  Location: [facility] CATH LAB;  Service: Cardiovascular;;  . PERCUTANEOUS CORONARY ROTOBLATOR INTERVENTION (PCI-R) N/A 08/11/2012   Procedure: PERCUTANEOUS CORONARY ROTOBLATOR INTERVENTION (PCI-R);  Surgeon: [name], MD;  Location: [facility] CATH LAB;  Service: Cardiovascular;  Laterality: N/A;  . PERCUTANEOUS CORONARY STENT INTERVENTION (PCI-S)  10/07/2013   Procedure: PERCUTANEOUS CORONARY STENT INTERVENTION (PCI-S);  Surgeon: [name], MD;  Location: [facility] CATH LAB;  Service: Cardiovascular;;  . PERMANENT PACEMAKER INSERTION N/A 03/16/2012   [name] (SJM) leadless pacemaker (LEADLESS II STUDY PATEINT)  . SALIVARY GLAND SURGERY  2000's   "had a little lump removed; granulosa related; it was benign" (08/11/2012)  . TEE WITHOUT CARDIOVERSION  12/31/2011   Procedure: TRANSESOPHAGEAL ECHOCARDIOGRAM (TEE);  Surgeon: [name], MD;  Location: [facility] ENDOSCOPY;  Service: Cardiovascular;  Laterality: N/A;  . VARICOSE VEIN SURGERY Bilateral 1977    Family History  Problem Relation Age of Onset  . Stroke Mother   . Heart attack Father   . Diabetes Father   . Hypertension Father   . Heart attack Brother   . Diabetes Brother   . Hypertension Brother   . Kidney failure Brother     Social History   Socioeconomic History  . Marital status: Widowed    Spouse name: Not on file  . Number of children: 4  . Years of education: Not on file  . Highest education level: Not on file  Occupational History  . Occupation: Retired Nurse, learning disability estate  Social Needs  . Financial resource strain: Not on file  . Food insecurity:    Worry: Not on file    Inability: Not on file  . Transportation needs:    Medical: Not on file    Non-medical: Not on file  Tobacco Use  . Smoking status: Former Smoker    Packs/day: 1.00    Years: 32.00    Pack years: 32.00    Types: Cigarettes    Last attempt to quit: 02/03/1974    Years  since quitting: 43.8  . Smokeless tobacco: Never Used  Substance and Sexual Activity  . Alcohol use: Yes    Alcohol/week: 5.0 standard drinks    Types: 5 Glasses of wine per week    Comment: occ  . Drug use: No  . Sexual activity: Never  Lifestyle  . Physical activity:    Days per week: Not on file    Minutes per session: Not on file  . Stress: Not on file  Relationships  . Social connections:    Talks on phone: Not on file  Gets together: Not on file    Attends religious service: Not on file    Active member of club or organization: Not on file    Attends meetings of clubs or organizations: Not on file    Relationship status: Not on file  . Intimate partner violence:    Fear of current or ex partner: Not on file    Emotionally abused: Not on file    Physically abused: Not on file    Forced sexual activity: Not on file  Other Topics Concern  . Not on file  Social History Narrative   Lives with family.    Past Medical History, Surgical history, Social history, and Family history were reviewed and updated as appropriate.   Please see review of systems for further details on the patient's review from today.   Review of Systems:  Review of Systems  Constitutional: Negative for appetite change, chills, diaphoresis and fever.  HENT: Negative for trouble swallowing.   Respiratory: Positive for chest tightness. Negative for cough and shortness of breath.   Cardiovascular: Negative for chest pain, palpitations and leg swelling.  Gastrointestinal: Positive for diarrhea and nausea. Negative for abdominal distention, abdominal pain, blood in stool, constipation and vomiting.  Genitourinary: Positive for decreased urine volume. Negative for difficulty urinating.  Neurological: Negative for weakness.    Objective:   Physical Exam:  There were no vitals taken for this visit. ECOG: 1  Physical Exam  Constitutional: No distress.  HENT:  Head: Normocephalic and atraumatic.    Mouth/Throat: Oropharynx is clear and moist.  Cardiovascular: Normal rate, regular rhythm and normal heart sounds. Exam reveals no gallop and no friction rub.  No murmur heard. Pulmonary/Chest: Effort normal and breath sounds normal. No respiratory distress. She has no wheezes. She has no rales.  Abdominal: Soft. Bowel sounds are normal. She exhibits no distension and no mass. There is no tenderness. There is no rebound and no guarding.  Musculoskeletal: She exhibits edema (Trace bilateral lower extremity edema to the knees.).  Neurological: She is alert.  Skin: Skin is warm and dry. She is not diaphoretic.    Lab Review:     Component Value Date/Time   NA 144 12/11/2017 1015   NA 142 12/14/2014 1324   K 3.7 12/11/2017 1015   K 4.2 12/14/2014 1324   CL 108 12/11/2017 1015   CL 105 05/26/2012 1111   CO2 29 12/11/2017 1015   CO2 30 (H) 12/14/2014 1324   GLUCOSE 109 (H) 12/11/2017 1015   GLUCOSE 130 12/14/2014 1324   GLUCOSE 89 05/26/2012 1111   BUN 14 12/11/2017 1015   BUN 19.6 12/01/2016 1116   CREATININE 0.95 12/11/2017 1015   CREATININE 0.9 12/01/2016 1116   CALCIUM 8.5 (L) 12/11/2017 1015   CALCIUM 9.1 12/14/2014 1324   PROT 6.1 (L) 12/11/2017 1015   PROT 6.5 10/08/2017 0905   PROT 6.5 12/14/2014 1324   ALBUMIN 2.8 (L) 12/11/2017 1015   ALBUMIN 3.9 10/08/2017 0905   ALBUMIN 3.4 (L) 12/14/2014 1324   AST 44 (H) 12/11/2017 1015   AST 29 12/14/2014 1324   ALT 36 12/11/2017 1015   ALT 20 12/14/2014 1324   ALKPHOS 164 (H) 12/11/2017 1015   ALKPHOS 103 12/14/2014 1324   BILITOT 1.1 12/11/2017 1015   BILITOT 0.77 12/14/2014 1324   GFRNONAA 53 (L) 12/11/2017 1015   GFRAA >60 12/11/2017 1015       Component Value Date/Time   WBC 5.1 12/11/2017 1015   WBC 6.1  12/10/2017 1029   RBC 3.29 (L) 12/11/2017 1015   HGB 10.7 (L) 12/11/2017 1015   HGB 13.0 12/14/2014 1324   HCT 33.9 (L) 12/11/2017 1015   HCT 39.5 12/14/2014 1324   PLT 128 (L) 12/11/2017 1015   PLT 198  12/14/2014 1324   MCV 103.0 (H) 12/11/2017 1015   MCV 97.3 12/14/2014 1324   MCH 32.5 12/11/2017 1015   MCHC 31.6 12/11/2017 1015   RDW 20.1 (H) 12/11/2017 1015   RDW 14.9 (H) 12/14/2014 1324   LYMPHSABS 0.9 12/11/2017 1015   LYMPHSABS 1.0 12/14/2014 1324   MONOABS 0.5 12/11/2017 1015   MONOABS 0.5 12/14/2014 1324   EOSABS 0.1 12/11/2017 1015   EOSABS 0.1 12/14/2014 1324   BASOSABS 0.0 12/11/2017 1015   BASOSABS 0.0 12/14/2014 1324   -------------------------------  Imaging from last 24 hours (if applicable):  Radiology interpretation: No results found.

## 2017-12-16 ENCOUNTER — Ambulatory Visit: Payer: Medicare Other | Admitting: Interventional Cardiology

## 2017-12-17 ENCOUNTER — Inpatient Hospital Stay: Payer: Medicare Other

## 2017-12-17 ENCOUNTER — Encounter: Payer: Self-pay | Admitting: Oncology

## 2017-12-17 ENCOUNTER — Inpatient Hospital Stay (HOSPITAL_BASED_OUTPATIENT_CLINIC_OR_DEPARTMENT_OTHER): Payer: Medicare Other | Admitting: Oncology

## 2017-12-17 ENCOUNTER — Other Ambulatory Visit: Payer: Self-pay | Admitting: Oncology

## 2017-12-17 VITALS — BP 133/82 | HR 65 | Temp 97.7°F | Resp 18 | Ht 60.0 in | Wt 170.0 lb

## 2017-12-17 DIAGNOSIS — C569 Malignant neoplasm of unspecified ovary: Secondary | ICD-10-CM

## 2017-12-17 DIAGNOSIS — I5040 Unspecified combined systolic (congestive) and diastolic (congestive) heart failure: Secondary | ICD-10-CM

## 2017-12-17 DIAGNOSIS — Z79899 Other long term (current) drug therapy: Secondary | ICD-10-CM | POA: Diagnosis not present

## 2017-12-17 DIAGNOSIS — R110 Nausea: Secondary | ICD-10-CM | POA: Diagnosis not present

## 2017-12-17 DIAGNOSIS — T451X5A Adverse effect of antineoplastic and immunosuppressive drugs, initial encounter: Secondary | ICD-10-CM

## 2017-12-17 DIAGNOSIS — Z5111 Encounter for antineoplastic chemotherapy: Secondary | ICD-10-CM | POA: Diagnosis not present

## 2017-12-17 DIAGNOSIS — I130 Hypertensive heart and chronic kidney disease with heart failure and stage 1 through stage 4 chronic kidney disease, or unspecified chronic kidney disease: Secondary | ICD-10-CM | POA: Diagnosis not present

## 2017-12-17 DIAGNOSIS — D3910 Neoplasm of uncertain behavior of unspecified ovary: Secondary | ICD-10-CM

## 2017-12-17 DIAGNOSIS — R197 Diarrhea, unspecified: Secondary | ICD-10-CM

## 2017-12-17 DIAGNOSIS — G620 Drug-induced polyneuropathy: Secondary | ICD-10-CM

## 2017-12-17 LAB — CBC WITH DIFFERENTIAL (CANCER CENTER ONLY)
ABS IMMATURE GRANULOCYTES: 0.01 10*3/uL (ref 0.00–0.07)
BASOS PCT: 1 %
Basophils Absolute: 0 10*3/uL (ref 0.0–0.1)
Eosinophils Absolute: 0.2 10*3/uL (ref 0.0–0.5)
Eosinophils Relative: 4 %
HCT: 31.9 % — ABNORMAL LOW (ref 36.0–46.0)
Hemoglobin: 10.3 g/dL — ABNORMAL LOW (ref 12.0–15.0)
IMMATURE GRANULOCYTES: 0 %
Lymphocytes Relative: 22 %
Lymphs Abs: 0.9 10*3/uL (ref 0.7–4.0)
MCH: 32.7 pg (ref 26.0–34.0)
MCHC: 32.3 g/dL (ref 30.0–36.0)
MCV: 101.3 fL — ABNORMAL HIGH (ref 80.0–100.0)
Monocytes Absolute: 0.6 10*3/uL (ref 0.1–1.0)
Monocytes Relative: 14 %
NEUTROS ABS: 2.4 10*3/uL (ref 1.7–7.7)
NEUTROS PCT: 59 %
PLATELETS: 171 10*3/uL (ref 150–400)
RBC: 3.15 MIL/uL — AB (ref 3.87–5.11)
RDW: 19.4 % — ABNORMAL HIGH (ref 11.5–15.5)
WBC: 4 10*3/uL (ref 4.0–10.5)
nRBC: 0 % (ref 0.0–0.2)

## 2017-12-17 LAB — CMP (CANCER CENTER ONLY)
ALT: 26 U/L (ref 0–44)
ANION GAP: 9 (ref 5–15)
AST: 25 U/L (ref 15–41)
Albumin: 2.9 g/dL — ABNORMAL LOW (ref 3.5–5.0)
Alkaline Phosphatase: 185 U/L — ABNORMAL HIGH (ref 38–126)
BILIRUBIN TOTAL: 0.5 mg/dL (ref 0.3–1.2)
BUN: 16 mg/dL (ref 8–23)
CO2: 33 mmol/L — ABNORMAL HIGH (ref 22–32)
Calcium: 8.9 mg/dL (ref 8.9–10.3)
Chloride: 103 mmol/L (ref 98–111)
Creatinine: 0.86 mg/dL (ref 0.44–1.00)
GFR, EST NON AFRICAN AMERICAN: 60 mL/min — AB (ref >60)
GFR, Est AFR Am: >60 mL/min (ref >60)
Glucose, Bld: 104 mg/dL — ABNORMAL HIGH (ref 70–99)
POTASSIUM: 3.6 mmol/L (ref 3.5–5.1)
SODIUM: 145 mmol/L (ref 135–145)
TOTAL PROTEIN: 6.1 g/dL — AB (ref 6.5–8.1)

## 2017-12-17 MED ORDER — SODIUM CHLORIDE 0.9 % IV SOLN
Freq: Once | INTRAVENOUS | Status: AC
  Administered 2017-12-17: 10:00:00 via INTRAVENOUS
  Filled 2017-12-17: qty 250

## 2017-12-17 MED ORDER — SODIUM CHLORIDE 0.9% FLUSH
10.0000 mL | INTRAVENOUS | Status: DC | PRN
  Administered 2017-12-17: 10 mL
  Filled 2017-12-17: qty 10

## 2017-12-17 MED ORDER — SODIUM CHLORIDE 0.9 % IV SOLN
Freq: Once | INTRAVENOUS | Status: AC
  Administered 2017-12-17: 11:00:00 via INTRAVENOUS
  Filled 2017-12-17: qty 5

## 2017-12-17 MED ORDER — PALONOSETRON HCL INJECTION 0.25 MG/5ML
INTRAVENOUS | Status: AC
  Filled 2017-12-17: qty 5

## 2017-12-17 MED ORDER — HEPARIN SOD (PORK) LOCK FLUSH 100 UNIT/ML IV SOLN
500.0000 [IU] | Freq: Once | INTRAVENOUS | Status: AC | PRN
  Administered 2017-12-17: 500 [IU]
  Filled 2017-12-17: qty 5

## 2017-12-17 MED ORDER — PALONOSETRON HCL INJECTION 0.25 MG/5ML
0.2500 mg | Freq: Once | INTRAVENOUS | Status: AC
  Administered 2017-12-17: 0.25 mg via INTRAVENOUS

## 2017-12-17 MED ORDER — SODIUM CHLORIDE 0.9 % IV SOLN
310.0000 mg | Freq: Once | INTRAVENOUS | Status: AC
  Administered 2017-12-17: 310 mg via INTRAVENOUS
  Filled 2017-12-17: qty 31

## 2017-12-17 NOTE — Assessment & Plan Note (Signed)
This is a pleasant 84 year old female with ovarian cancer.  Currently on carboplatin for an AUC of 5.  Treatment was held last week due to diarrhea likely due to a GI virus.  Diarrhea has not completely resolved.  She is feeling well today recommend her to proceed with cycle #3 of carboplatin today as scheduled.  She has a restaging CT scan scheduled for early December and will follow-up with Dr. Alvy Bimler after the scan to discuss the results.

## 2017-12-17 NOTE — Progress Notes (Signed)
[place] OFFICE PROGRESS NOTE  [name], MD [place] [address] [place] [address]    Granulosa cell tumor of ovary   02/19/1998 Initial Diagnosis    The granulosa cell tumor was initially diagnosed at [facility] in 2000, with recurrences in 2004, 2007 and spring 2011. The original operative note from 2000 and that pathology did not transfer into present EMR and I do not know laterality of the original tumor. The surgery in 2004 included resection of the tumor with small bowel resection and colectomy for diverticulosis. She had laparoscopic repair of ventral hernia in 2005. In 2007 she had resection of pelvic recurrence of tumor plus open repair of ventral hernia with mesh. Most recent intervention was laparoscopic resection of suprapubic involvement by Dr.Hoxworth in May 2011; she had a small and asymptomatic suprapubic hernia in 2011 which has been followed. All of her disease has been very indolent and has generally been detected by CT scans. Last CT AP was April 2013; [name], self-limited episode of bowel obstruction in May 2012.    08/24/2002 Pathology Results    SOFT TISSUE MASS, [name], [name], there are tumor cells present which appear histologically similar to those noted in the patient's previous granulosa cell tumor ([id number]) and therefore, consistent with recurrent/metastatic granulosa cell tumor. The findings correlate with the cytology material    12/06/2002 Pathology Results    1. SMALL INTESTINE, [name], SIGMOID, SEGMENTAL RESECTION: DIVERTICULOSIS WITH FOCAL MILD CHRONIC DIVERTICULITIS. SIX BENIGN LYMPH NODES. NO TUMOR IDENTIFIED.     05/12/2005 PET scan    Again identified are enlarged lymph nodes within the right posterior cervical region and left external iliac region, [name], especially when compared with the examination dated 10/30/04.    10/01/2005 Pathology Results    1. ABDOMINAL WALL MASS: METASTATIC GRANULOSA CELL TUMOR.  2. LEFT ILIAC MASS: METASTATIC GRANULOSA CELL TUMOR.  COMMENT Both specimens are involved by metastatic tumor with features consistent with metastasis from the previously diagnosed adult granulosa cell tumor.     06/11/2007 Imaging    Stable CT the pelvis - no evidence of metastatic disease.    06/09/2008 Imaging    1.  Interval enlargement of small left perivesical nodule measuring 18 mm maximally.  This may reflect a lymph node but has a nonspecific appearance.  Attention to this area followup is recommended. 2.  Otherwise stable postsurgical appearance of the pelvis.    12/14/2008 Imaging    1.  Mild increase in size of low attenuation nodule in the left anterior pelvis indenting the urinary bladder, which now measures 2.8 cm compared with 1.8 cm on prior study.  Metastatic disease cannot be excluded. 2.  No other significant changes identified.  Stable diverticulosis and small suprapubic anterior abdominal wall hernia containing a loop of small bowel.    05/23/2009 Pathology Results    1. SOFT TISSUE MASS, SIMPLE EXCISION, : INVOLVEMENT BY NEOPLASM CONSISTENT WITH METASTATIC GRANULOSA CELL TUMOR.  06/08/2010 Imaging    1.  No acute findings. 2.  Interval progression of mild diffuse left renal atrophy and hypoperfusion, consistent with left renal artery stenosis. 3.  Stable small hiatal hernia, tiny periumbilical hernia, and small suprapubic hernia containing a  loop of small bowel.  No evidence of bowel ischemia or obstruction. 4.  Diverticulosis.  No radiographic evidence of diverticulitis.    05/22/2011 Imaging    1.  Stable exam.  No new or progressive disease identified within the abdomen or pelvis. 2.  Stable small hiatal hernia and suprapubic ventral hernia. Stable left renal atrophy, likely secondary to renal artery stenosis.    11/28/2011 Imaging    1.  No findings to suggest recurrent or metastatic disease in the abdomen or pelvis on today's examination. 2.  Indeterminate liver lesion in the right lobe of the liver is unchanged compared to numerous remote prior examinations. 3. Small suprapubic ventral hernia again noted. 4.  Extensive atherosclerosis including at least three vessel coronary artery disease.  5.  Postoperative changes, as above.     12/01/2016 Tumor Marker    Patient's tumor was tested for the following markers: Inhibin B Results of the tumor marker test revealed 119.2    12/11/2016 Imaging    Multiple new soft tissue masses in left pelvis and central small bowel mesentery, consistent with recurrent/metastatic carcinoma.     03/25/2017 Imaging    1. Omental and mesenteric tumor implants throughout the abdomen and pelvis with mixed findings when compared the prior study. Some of these lesions are clearly smaller and others are clearly larger. No new lesions are identified. 2. Stable severe atherosclerotic calcifications. 3. Stable low anterior abdominal wall hernia. 4. Stable low-attenuation lesion involving the right hepatic lobe with adjacent calcifications or surgical clips.    10/12/2017 Imaging    Extraperitoneal fluid/hemorrhage related to the patient's dominant left pelvic tumor, as described above.  Progression of the patient's metastatic ovarian cancer, as described above. Dominant cystic/solid mass in the left adnexa/lower pelvis has increased in size. Peritoneal disease/omental caking is mildly progressive.  New pulmonary nodule at the right lung base, indeterminate.     10/12/2017 - 10/17/2017 Hospital Admission    She was admitted for retroperitoneal bleed.    10/16/2017 Imaging    1. Improving peritoneal hemorrhage. 2. No significant change in cystic and solid left pelvic mass and smaller anterior peritoneal cystic and solid mass compatible with tumor implant. 3. No evidence of metastatic disease elsewhere in the abdomen or pelvis. 4. Cardiomegaly. 5.  Calcific coronary artery and aortic atherosclerosis.  Aortic Atherosclerosis (ICD10-I70.0).    10/27/2017 Tumor Marker    Patient's tumor was tested for the following markers: Inhibin B Results of the tumor marker test revealed 98.4    10/28/2017 Procedure    Status post right IJ port catheter placement.    10/29/2017 -  Chemotherapy    The patient had carboplatin    11/24/2017 Tumor Marker    Patient's tumor was tested for the following markers: Inhibin B Results of the tumor marker test revealed 113.6     Ovarian cancer ([place])   10/19/2017 Initial Diagnosis    Ovarian cancer ([place])     CURRENT THERAPY: Carboplatin for an AUC of 5 every 3 weeks.  Status post 2 cycles.  INTERVAL HISTORY: Patty [name] 84 y.o. female returns for routine follow-up visit accompanied by her friend.  The patient is feeling better today.  Her diarrhea has completely resolved.  Denies fevers  and chills.  Denies chest pain, shortness of breath, cough, hemoptysis.  Denies nausea, vomiting.  She reports having some constipation since taking Imodium to control her diarrhea.  She is now back on her stool softeners.  Denies abdominal discomfort.  Reports ongoing mild peripheral neuropathy which has not worsened.  Denies vaginal bleeding.  The patient is here for evaluation prior to cycle #3 of her chemotherapy.  MEDICAL HISTORY: Past Medical History:  Diagnosis Date  . Bleeding behind the abdominal cavity 10/2017  . Chronic anticoagulation - coumadin,  CHADS2VASC=6 05/17/2015  . CKD (chronic kidney disease), stage III ([place])   . Combined systolic and diastolic heart failure ([place])   . Coronary artery disease    a. s/p multiple stents - stenting x 2 to the LAD and x 1 to the LCx in 2000, rotational arthrectomy to proximal LCx and DES to LAD in 2014 and DES to LCx in 2014, along with DES to LAD for re-in-stent stenosis in 2015, and DES to ostial LAD in 2016. b. 07/2016 - orbital atherectomy & DES to mid LCx.  . Diverticulitis   . Granulosa cell carcinoma ([place])    abd; last episode was in 2009  . Hyperlipidemia   . Hypertension   . LBBB (left bundle branch block)       . Myocardial infarction ([place]) 2002  . Obesity   . OSA on CPAP   . Ovarian ca ([place]) 2019  . Pacemaker failure    a. Prior leadless PPM with premature battery failure, being managed conservatively without replacement.  . Peripheral vascular disease ([place])   . Permanent atrial fibrillation 2013  . Renal artery stenosis ([place])   . Tachycardia-bradycardia syndrome ([place])    a. s/p leadless pacemaker ([name]) implanted by Dr [name]  . Varicose veins     ALLERGIES:  is allergic to bee venom; other; amiodarone; and prednisone.  MEDICATIONS:  Current Outpatient Medications  Medication Sig Dispense Refill  . acetaminophen (TYLENOL) 500 MG tablet Take 500 mg by mouth at bedtime as needed for mild pain.     Marland Kitchen atorvastatin (LIPITOR) 80 MG tablet TAKE 1 TABLET BY MOUTH EVERY MORNING (Patient taking differently: Take 80 mg by mouth every morning. ) 90 tablet 2  . beta carotene w/minerals (OCUVITE) tablet Take 1 tablet by mouth 2 (two) times daily.     . Calcium Carb-Cholecalciferol (CALTRATE 600+D) 600-800 MG-UNIT TABS Take 1 tablet by mouth every evening.     . Coenzyme Q-10 100 MG capsule Take 100-200 mg by mouth daily.     Marland Kitchen diltiazem (CARDIZEM CD) 120 MG 24 hr capsule TAKE ONE CAPSULE BY MOUTH AT BEDTIME (Patient taking differently: Take 120 mg by mouth at bedtime. ) 90 capsule 3   . diltiazem (CARDIZEM CD) 240 MG 24 hr capsule TAKE ONE CAPSULE BY MOUTH DAILY WITH BREAKFAST (Patient taking differently: Take 240 mg by mouth daily. ) 30 capsule 11  . docusate sodium (COLACE) 100 MG capsule Take 100 mg by mouth 2 (two) times daily.    . furosemide (LASIX) 40 MG tablet Take 80 mg by mouth daily.    Marland Kitchen gabapentin (NEURONTIN) 300 MG capsule Take 1 capsule (300 mg total) by mouth 2 (two) times daily. 180 capsule 2  . HYDROmorphone (DILAUDID) 2 MG tablet Take 1 tablet (2 mg total) by mouth every 4 (four) hours as needed for severe pain. 30 tablet 0  . isosorbide mononitrate (IMDUR) 60 MG 24 hr tablet TAKE 1 TABLET BY MOUTH DAILY (  Patient taking differently: Take 60 mg by mouth daily. ) 30 tablet 9  . KLOR-CON M20 20 MEQ tablet TAKE 1 TABLET BY MOUTH EVERY DAY (Patient taking differently: Take 20 mEq by mouth at bedtime. ) 90 tablet 3  . lidocaine-prilocaine (EMLA) cream Apply 1 application topically as needed. (Patient taking differently: Apply 1 application topically as needed (port access). ) 30 g 6  . metoprolol tartrate (LOPRESSOR) 100 MG tablet Take 1 tablet (100 mg total) by mouth 2 (two) times daily. 60 tablet 9  . Multiple Vitamin (MULTIVITAMIN) capsule Take 1 capsule by mouth daily.    . NON FORMULARY Place 1 each into the nose at bedtime. CPAP setting 3.5    . Omega-3 Fatty Acids (FISH OIL) 1200 MG CAPS Take 1,200 mg by mouth daily.     . ondansetron (ZOFRAN) 8 MG tablet Take 1 tablet (8 mg total) by mouth every 8 (eight) hours as needed for nausea. 30 tablet 3  . prochlorperazine (COMPAZINE) 10 MG tablet Take 1 tablet (10 mg total) by mouth every 6 (six) hours as needed for nausea or vomiting. 30 tablet 0  . spironolactone (ALDACTONE) 25 MG tablet Take 25 mg by mouth at bedtime as needed (swelling in ankles).     . nitroGLYCERIN (NITROSTAT) 0.4 MG SL tablet PLACE 1 TABLET (0.4 MG TOTAL) UNDER THE TONGUE EVERY 5 (FIVE) MINUTES AS NEEDED. FOR CHEST PAIN (Patient not taking: No  sig reported) 25 tablet 6   No current facility-administered medications for this visit.    Facility-Administered Medications Ordered in Other Visits  Medication Dose Route Frequency Provider Last Rate Last Dose  . sodium chloride flush (NS) 0.9 % injection 10 mL  10 mL Intracatheter PRN Heath Lark, MD   10 mL at 12/17/17 1220    SURGICAL HISTORY:  Past Surgical History:  Procedure Laterality Date  . ABDOMINAL HYSTERECTOMY    . CARDIAC CATHETERIZATION  09/03/2007   EF 70%; Failed attempt at PCI to OM  . CARDIAC CATHETERIZATION  11/01/2003   EF 70%  . CARDIAC CATHETERIZATION N/A 12/14/2015   Procedure: Left Heart Cath and Coronary Angiography;  Surgeon: Burnell Blanks, MD;  Location: [facility] CV LAB;  Service: Cardiovascular;  Laterality: N/A;  . CARDIOVERSION  12/31/2011   Procedure: CARDIOVERSION;  Surgeon: [name], MD;  Location: [facility] ENDOSCOPY;  Service: Cardiovascular;  Laterality: N/A;  . CARDIOVERSION N/A 12/31/2011   Procedure: CARDIOVERSION;  Surgeon: [name], MD;  Location: [facility] CATH LAB;  Service: Cardiovascular;  Laterality: N/A;  . CATARACT EXTRACTION, BILATERAL  2015  . CHOLECYSTECTOMY  1980's  . COLON SURGERY  2004   colectomy for diverticulosis  . CORONARY ANGIOPLASTY WITH STENT PLACEMENT  2000; 08/11/2012; 11/12/2012   3 + 2 LAD & CFX; 2nd CFX stent 11/12/2012  . CORONARY ATHERECTOMY N/A 07/15/2016   Procedure: Coronary Atherectomy;  Surgeon: Martinique, Peter M, MD;  Location: [facility] CV LAB;  Service: Cardiovascular;  Laterality: N/A;  . CORONARY BALLOON ANGIOPLASTY N/A 07/14/2016   Procedure: Coronary Balloon Angioplasty;  Surgeon: Martinique, Peter M, MD;  Location: [facility] CV LAB;  Service: Cardiovascular;  Laterality: N/A;  . CORONARY STENT INTERVENTION N/A 07/15/2016   Procedure: Coronary Stent Intervention;  Surgeon: Martinique, Peter M, MD;  Location: [facility] CV LAB;  Service: Cardiovascular;  Laterality: N/A;  . FRACTIONAL FLOW  RESERVE WIRE  10/07/2013   Procedure: [place];  Surgeon: [name], MD;  Location: [facility] CATH LAB;  Service: Cardiovascular;;  . granulosa tumor excision  2000; 2003; 2004; 2007   "all in my abdomen including small intestines, outside my ?uterus/etc" (08/11/2012)  . HERNIA REPAIR  2005   "laparoscopic"  . IR IMAGING GUIDED PORT INSERTION  10/28/2017  . LEFT HEART CATH AND CORONARY ANGIOGRAPHY N/A 07/14/2016   Procedure: Left Heart Cath and Coronary Angiography;  Surgeon: Martinique, Peter M, MD;  Location: [place] CV LAB;  Service: Cardiovascular;  Laterality: N/A;  . LEFT HEART CATHETERIZATION WITH CORONARY ANGIOGRAM N/A 11/12/2012   Procedure: LEFT HEART CATHETERIZATION WITH CORONARY ANGIOGRAM;  Surgeon: [name], MD;  Location: [facility] CATH LAB;  Service: Cardiovascular;  Laterality: N/A;  . LEFT HEART CATHETERIZATION WITH CORONARY ANGIOGRAM N/A 10/07/2013   Procedure: LEFT HEART CATHETERIZATION WITH CORONARY ANGIOGRAM;  Surgeon: [name], MD;  Location: [name] CATH LAB;  Service: Cardiovascular;  Laterality: N/A;  . LEFT HEART CATHETERIZATION WITH CORONARY ANGIOGRAM N/A 12/14/2013   Procedure: LEFT HEART CATHETERIZATION WITH CORONARY ANGIOGRAM;  Surgeon: Sinclair Grooms, MD;  Location: [facility] CATH LAB;  Service: Cardiovascular;  Laterality: N/A;  . LEFT HEART CATHETERIZATION WITH CORONARY ANGIOGRAM N/A 05/16/2014   Procedure: LEFT HEART CATHETERIZATION WITH CORONARY ANGIOGRAM;  Surgeon: Sherren Mocha, MD;  Location: [facility] CATH LAB;  Service: Cardiovascular;  Laterality: N/A;  . PERCUTANEOUS CORONARY INTERVENTION-BALLOON ONLY  08/04/2012   Procedure: PERCUTANEOUS CORONARY INTERVENTION-BALLOON ONLY;  Surgeon: [name], MD;  Location: [facility] CATH LAB;  Service: Cardiovascular;;  . PERCUTANEOUS CORONARY ROTOBLATOR INTERVENTION (PCI-R) N/A 08/11/2012   Procedure: PERCUTANEOUS CORONARY ROTOBLATOR INTERVENTION (PCI-R);  Surgeon: [name], MD;  Location: [facility]  CATH LAB;  Service: Cardiovascular;  Laterality: N/A;  . PERCUTANEOUS CORONARY STENT INTERVENTION (PCI-S)  10/07/2013   Procedure: PERCUTANEOUS CORONARY STENT INTERVENTION (PCI-S);  Surgeon: [name], MD;  Location: [facility] CATH LAB;  Service: Cardiovascular;;  . PERMANENT PACEMAKER INSERTION N/A 03/16/2012   [name] (SJM) leadless pacemaker (LEADLESS II STUDY PATEINT)  . SALIVARY GLAND SURGERY  2000's   "had a little lump removed; granulosa related; it was benign" (08/11/2012)  . TEE WITHOUT CARDIOVERSION  12/31/2011   Procedure: TRANSESOPHAGEAL ECHOCARDIOGRAM (TEE);  Surgeon: [name], MD;  Location: [facility];  Service: Cardiovascular;  Laterality: N/A;  . VARICOSE VEIN SURGERY Bilateral 1977    REVIEW OF SYSTEMS:   Review of Systems  Constitutional: Negative for appetite change, chills, fatigue, fever and unexpected weight change.  HENT:   Negative for mouth sores, nosebleeds, sore throat and trouble swallowing.   Eyes: Negative for eye problems and icterus.  Respiratory: Negative for cough, hemoptysis, shortness of breath and wheezing.   Cardiovascular: Negative for chest pain and leg swelling.  Gastrointestinal: Negative for abdominal pain, diarrhea, nausea and vomiting.  Positive for constipation. Genitourinary: Negative for bladder incontinence, difficulty urinating, dysuria, frequency and hematuria.   Musculoskeletal: Negative for back pain, gait problem, neck pain and neck stiffness.  Skin: Negative for itching and rash.  Neurological: Negative for dizziness, extremity weakness, gait problem, headaches, light-headedness and seizures.  Positive for peripheral neuropathy. Hematological: Negative for adenopathy. Does not bruise/bleed easily.  Psychiatric/Behavioral: Negative for confusion, depression and sleep disturbance. The patient is not nervous/anxious.     PHYSICAL EXAMINATION:  Blood pressure 133/82, pulse 65, temperature 97.7 F (36.5 C), temperature  source Oral, resp. rate 18, height 5' (1.524 m), weight 170 lb (77.1 kg), SpO2 96 %.  ECOG PERFORMANCE STATUS: 1 - Symptomatic but completely ambulatory  Physical Exam  Constitutional: Oriented to person,  place, and time and well-developed, well-nourished, and in no distress. No distress.  HENT:  Head: Normocephalic and atraumatic.  Mouth/Throat: Oropharynx is clear and moist. No oropharyngeal exudate.  Eyes: Conjunctivae are normal. Right eye exhibits no discharge. Left eye exhibits no discharge. No scleral icterus.  Neck: Normal range of motion. Neck supple.  Cardiovascular: Normal rate, regular rhythm, normal heart sounds and intact distal pulses.   Pulmonary/Chest: Effort normal and breath sounds normal. No respiratory distress. No wheezes. No rales.  Abdominal: Soft. Bowel sounds are normal. Exhibits no distension and no mass. There is no tenderness.  Musculoskeletal: Normal range of motion. Exhibits no edema.  Lymphadenopathy:    No cervical adenopathy.  Neurological: Alert and oriented to person, place, and time. Exhibits normal muscle tone. Gait normal. Coordination normal.  Skin: Skin is warm and dry. No rash noted. Not diaphoretic. No erythema. No pallor.  Psychiatric: Mood, memory and judgment normal.  Vitals reviewed.  LABORATORY DATA: Lab Results  Component Value Date   WBC 4.0 12/17/2017   HGB 10.3 (L) 12/17/2017   HCT 31.9 (L) 12/17/2017   MCV 101.3 (H) 12/17/2017   PLT 171 12/17/2017      Chemistry      Component Value Date/Time   NA 145 12/17/2017 0828   NA 142 12/14/2014 1324   K 3.6 12/17/2017 0828   K 4.2 12/14/2014 1324   CL 103 12/17/2017 0828   CL 105 05/26/2012 1111   CO2 33 (H) 12/17/2017 0828   CO2 30 (H) 12/14/2014 1324   BUN 16 12/17/2017 0828   BUN 19.6 12/01/2016 1116   CREATININE 0.86 12/17/2017 0828   CREATININE 0.9 12/01/2016 1116      Component Value Date/Time   CALCIUM 8.9 12/17/2017 0828   CALCIUM 9.1 12/14/2014 1324   ALKPHOS 185  (H) 12/17/2017 0828   ALKPHOS 103 12/14/2014 1324   AST 25 12/17/2017 0828   AST 29 12/14/2014 1324   ALT 26 12/17/2017 0828   ALT 20 12/14/2014 1324   BILITOT 0.5 12/17/2017 0828   BILITOT 0.77 12/14/2014 1324       RADIOGRAPHIC STUDIES:  No results found.   ASSESSMENT/PLAN:  Granulosa cell tumor of ovary This is a pleasant 84 year old female with ovarian cancer.  Currently on carboplatin for an AUC of 5.  Treatment was held last week due to diarrhea likely due to a GI virus.  Diarrhea has not completely resolved.  She is feeling well today recommend her to proceed with cycle #3 of carboplatin today as scheduled.  She has a restaging CT scan scheduled for early December and will follow-up with Dr. Alvy Bimler after the scan to discuss the results.  Peripheral neuropathy due to chemotherapy [facility]) She has mild peripheral neuropathy related to treatment.  Neuropathy has not worsened.  Continue gabapentin.  Combined systolic and diastolic heart failure, NYHA class 3 ([place]) Continue medical management under the care of her cardiologist.  She has no clear-cut signs or symptoms of congestive heart failure today.   No orders of the defined types were placed in this encounter.    Mikey Bussing, DNP, AGPCNP-BC, AOCNP 12/17/17

## 2017-12-17 NOTE — Assessment & Plan Note (Signed)
Continue medical management under the care of her cardiologist.  She has no clear-cut signs or symptoms of congestive heart failure today.

## 2017-12-17 NOTE — Patient Instructions (Signed)
[facility] Discharge Instructions for Patients Receiving Chemotherapy  Today you received the following chemotherapy agents: Carboplatin.  To help prevent nausea and vomiting after your treatment, we encourage you to take your nausea medication as prescribed.   If you develop nausea and vomiting that is not controlled by your nausea medication, call the clinic.   BELOW ARE SYMPTOMS THAT SHOULD BE REPORTED IMMEDIATELY:  *FEVER GREATER THAN 100.5 F  *CHILLS WITH OR WITHOUT FEVER  NAUSEA AND VOMITING THAT IS NOT CONTROLLED WITH YOUR NAUSEA MEDICATION  *UNUSUAL SHORTNESS OF BREATH  *UNUSUAL BRUISING OR BLEEDING  TENDERNESS IN MOUTH AND THROAT WITH OR WITHOUT PRESENCE OF ULCERS  *URINARY PROBLEMS  *BOWEL PROBLEMS  UNUSUAL RASH Items with * indicate a potential emergency and should be followed up as soon as possible.  Feel free to call the clinic should you have any questions or concerns. The clinic phone number is [phone].  Please show the CHEMO ALERT CARD at check-in to the Emergency Department and triage nurse.     

## 2017-12-17 NOTE — Assessment & Plan Note (Signed)
She has mild peripheral neuropathy related to treatment.  Neuropathy has not worsened.  Continue gabapentin.

## 2017-12-18 ENCOUNTER — Ambulatory Visit: Payer: Medicare Other | Admitting: Podiatry

## 2017-12-18 DIAGNOSIS — I2510 Atherosclerotic heart disease of native coronary artery without angina pectoris: Secondary | ICD-10-CM | POA: Diagnosis not present

## 2017-12-18 DIAGNOSIS — B0229 Other postherpetic nervous system involvement: Secondary | ICD-10-CM | POA: Diagnosis not present

## 2017-12-18 DIAGNOSIS — I5040 Unspecified combined systolic (congestive) and diastolic (congestive) heart failure: Secondary | ICD-10-CM | POA: Diagnosis not present

## 2017-12-18 DIAGNOSIS — I130 Hypertensive heart and chronic kidney disease with heart failure and stage 1 through stage 4 chronic kidney disease, or unspecified chronic kidney disease: Secondary | ICD-10-CM | POA: Diagnosis not present

## 2017-12-18 DIAGNOSIS — N183 Chronic kidney disease, stage 3 (moderate): Secondary | ICD-10-CM | POA: Diagnosis not present

## 2017-12-18 DIAGNOSIS — C569 Malignant neoplasm of unspecified ovary: Secondary | ICD-10-CM | POA: Diagnosis not present

## 2017-12-21 ENCOUNTER — Ambulatory Visit (INDEPENDENT_AMBULATORY_CARE_PROVIDER_SITE_OTHER): Payer: Medicare Other | Admitting: Podiatry

## 2017-12-21 ENCOUNTER — Encounter: Payer: Self-pay | Admitting: Podiatry

## 2017-12-21 VITALS — BP 126/54 | HR 80 | Resp 16

## 2017-12-21 DIAGNOSIS — M79676 Pain in unspecified toe(s): Secondary | ICD-10-CM

## 2017-12-21 DIAGNOSIS — B351 Tinea unguium: Secondary | ICD-10-CM | POA: Diagnosis not present

## 2017-12-21 DIAGNOSIS — T451X5A Adverse effect of antineoplastic and immunosuppressive drugs, initial encounter: Secondary | ICD-10-CM

## 2017-12-21 DIAGNOSIS — G620 Drug-induced polyneuropathy: Secondary | ICD-10-CM

## 2017-12-22 ENCOUNTER — Encounter: Payer: Self-pay | Admitting: Podiatry

## 2017-12-22 LAB — CUP PACEART REMOTE DEVICE CHECK
Battery Remaining Longevity: 117 mo
Brady Statistic AP VP Percent: 1.9 %
Brady Statistic AP VS Percent: 4.4 %
Brady Statistic AS VP Percent: 89 %
Brady Statistic AS VS Percent: 4.8 %
Brady Statistic RV Percent Paced: 52 %
Date Time Interrogation Session: 20191007113310
Implantable Lead Implant Date: 20181224
Lead Channel Impedance Value: 540 Ohm
Lead Channel Pacing Threshold Amplitude: 0.75 V
Lead Channel Pacing Threshold Pulse Width: 0.4 ms
Lead Channel Sensing Intrinsic Amplitude: 11.3 mV
Lead Channel Setting Pacing Amplitude: 2.5 V
Lead Channel Setting Pacing Pulse Width: 0.4 ms
Lead Channel Setting Sensing Sensitivity: 2 mV
MDC IDC LEAD IMPLANT DT: 20181224
MDC IDC LEAD LOCATION: 753859
MDC IDC LEAD LOCATION: 753860
MDC IDC MSMT BATTERY REMAINING PERCENTAGE: 95.5 %
MDC IDC MSMT BATTERY VOLTAGE: 3.01 V
MDC IDC MSMT LEADCHNL RA IMPEDANCE VALUE: 690 Ohm
MDC IDC MSMT LEADCHNL RA PACING THRESHOLD AMPLITUDE: 0.75 V
MDC IDC MSMT LEADCHNL RA PACING THRESHOLD PULSEWIDTH: 0.4 ms
MDC IDC MSMT LEADCHNL RA SENSING INTR AMPL: 5 mV
MDC IDC PG IMPLANT DT: 20181224
MDC IDC PG SERIAL: 8968200
MDC IDC SET LEADCHNL RA PACING AMPLITUDE: 2 V
MDC IDC STAT BRADY RA PERCENT PACED: 2.4 %

## 2017-12-22 NOTE — Progress Notes (Signed)
Subjective: Patty [name] presents today with history of neuropathy with cc of painful, mycotic toenails.  Pain is aggravated when wearing enclosed shoe gear and relieved with periodic professional debridement.  She is being treated with chemotherapy for metastatic ovarian cancer.  Patient has peripheral neuropathy secondary to chemotherapy which is managed with gabapentin.  Medical History   10/2017 Bleeding behind the abdominal cavity  2019 Ovarian ca ([place])  05/17/2015 Chronic anticoagulation - coumadin, CHADS2VASC=6  2013 Permanent atrial fibrillation  2002 Myocardial infarction [facility])  Date Unknown CKD (chronic kidney disease), stage III ([place])  Date Unknown Combined systolic and diastolic heart failure ([name])  Date Unknown Coronary artery disease   Date Unknown Diverticulitis  Date Unknown Granulosa cell carcinoma ([place])   Date Unknown Hyperlipidemia  Date Unknown Hypertension  Date Unknown LBBB (left bundle branch block)   Date Unknown Obesity  Date Unknown OSA on CPAP  Date Unknown Pacemaker failure   Date Unknown Peripheral vascular disease ([place])  Date Unknown Renal artery stenosis ([place])  Date Unknown Tachycardia-bradycardia syndrome ([place])   Date Unknown Varicose veins   Problem List   Cardiovascular and Mediastinum   Combined systolic and diastolic heart failure, NYHA class 3 ([name])   CAD (coronary artery disease)   Essential hypertension   Renal artery stenosis ([name])   Atrial fibrillation with RVR ([name])   Tachycardia-bradycardia syndrome ([name])   Atrial flutter ([name])   Unstable angina ([name])   NSTEMI (non-ST elevated myocardial infarction) ([place])   Permanent atrial fibrillation with RVR   Demand ischemia ([name])   Persistent atrial fibrillation   CHF (congestive heart failure), NYHA class II, acute, combined ([place])  Respiratory   OSA on CPAP  Endocrine   Granulosa cell tumor of ovary   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[name])   Ovarian cancer ([name])   Nervous and Auditory   Postherpetic neuralgia   Peripheral neuropathy due to chemotherapy ([name])  Hematopoietic and Hemostatic   Pancytopenia, acquired ([place])  Other   Hyperlipidemia   Obesity   Ventral hernia   Pacemaker - St Jude Leadless PPM   Chest pain   Hypokalemia   Ischemic chest pain ([name])   Encounter for therapeutic drug monitoring   Swelling of lower extremity   Chronic anticoagulation - coumadin, CHADS2VASC=6   History of small bowel obstruction   Normocytic anemia   Warfarin anticoagulation   DOE (dyspnea on exertion)   Status post coronary artery stent placement   Abdominal pain   Intraperitoneal hemorrhage   Other constipation   Cancer associated pain   Physical debility   Goals of care, counseling/discussion   Protein-calorie malnutrition, moderate ([name])   Hyperglycemia   Pain of joint of left ankle and foot   Surgical History   10/28/2017 Ir imaging guided port insertion  07/15/2016 Coronary atherectomy (N/A)   07/15/2016 Coronary stent intervention (N/A)   07/14/2016 Left heart cath and coronary angiography (N/A)   07/14/2016 Coronary balloon angioplasty (N/A)   12/14/2015 Cardiac catheterization (N/A)   05/16/2014 Left heart catheterization with coronary angiogram (N/A)   12/14/2013 Left heart catheterization with coronary angiogram (N/A)   10/07/2013 Left heart catheterization with coronary angiogram (N/A)   10/07/2013 Percutaneous coronary stent intervention (pci-s)   10/07/2013 Fractional flow reserve wire   2015 Cataract extraction, bilateral  11/12/2012 Left heart catheterization with coronary angiogram (N/A)   08/11/2012 Percutaneous coronary rotoblator intervention (pci-r) (N/A)   08/04/2012 Percutaneous coronary intervention-balloon only   03/16/2012 Permanent pacemaker insertion (N/A)   12/31/2011 Tee  without cardioversion   12/31/2011 Cardioversion   12/31/2011 Cardioversion (N/A)   09/03/2007 Cardiac catheterization    11/01/2003 Cardiac catheterization   2005 Hernia repair   2004 Colon surgery   2000's Salivary gland surgery   1980's Cholecystectomy  1977 Varicose vein surgery (Bilateral)  Date Unknown Abdominal hysterectomy  2000; 08/11/2012; 11/12/2012 Coronary angioplasty with stent placement   2000; 2003; 2004; 2007 granulosa tumor excision [Other]    Medications    acetaminophen (TYLENOL) 500 MG tablet    atorvastatin (LIPITOR) 80 MG tablet    beta carotene w/minerals (OCUVITE) tablet    Calcium Carb-Cholecalciferol (CALTRATE 600+D) 600-800 MG-UNIT TABS    Coenzyme Q-10 100 MG capsule    diltiazem (CARDIZEM CD) 120 MG 24 hr capsule    diltiazem (CARDIZEM CD) 240 MG 24 hr capsule    docusate sodium (COLACE) 100 MG capsule    furosemide (LASIX) 40 MG tablet    gabapentin (NEURONTIN) 300 MG capsule    HYDROmorphone (DILAUDID) 2 MG tablet    isosorbide mononitrate (IMDUR) 60 MG 24 hr tablet    KLOR-CON M20 20 MEQ tablet    lidocaine-prilocaine (EMLA) cream    metoprolol tartrate (LOPRESSOR) 100 MG tablet    Multiple Vitamin (MULTIVITAMIN) capsule    nitroGLYCERIN (NITROSTAT) 0.4 MG SL tablet    NON FORMULARY    Omega-3 Fatty Acids (FISH OIL) 1200 MG CAPS    ondansetron (ZOFRAN) 8 MG tablet    prochlorperazine (COMPAZINE) 10 MG tablet    spironolactone (ALDACTONE) 25 MG tablet    Allergies     Bee VenomAnaphylaxis  OtherNausea And Vomiting, Other (See Comments)  AmiodaroneNausea Only  PrednisoneOther (See Comments)   Tobacco History   Smoking Status  Former Smoker  Quit 02/03/1974  Types  Cigarettes  Amount 1 packs/day for 32 years (32.00 pk-yrs)      Smokeless Tobacco Status  Never Used   Family History     Mother (Deceased at age 76) Stroke         Father (Deceased at age 61) Heart attack    Diabetes    Hypertension         Maternal Grandmother (Deceased)         Maternal Grandfather (Deceased)         Paternal Grandmother (Deceased)         Paternal Grandfather  (Deceased)         Brother Heart attack         Brother Diabetes         Brother Hypertension         Brother Kidney failure    Review of systems: Constitutional: Denies chills fatigue fever sweats weight change Eyes: Denies diplopia glare light sensitivity Ears nose mouth throat: Denies vertigo denies bloody nose rhinitis denies cold sores and snoring Cardiovascular: Denies chest pain tightness Respiratory: Denies difficulty breathing, denies congestion Gastrointestinal: Denies abdominal pain, diarrhea, nausea, vomiting Genitourinary: Denies nocturia, pain on urination, blood in urine Musculoskeletal: Denies cramping Skin: +changes in toenails, denies color change dryness, itchy skin, mole changes, or rash  Neurological: +numbness in feet, +burning in feet, +tingling in feet, Denies fainting, denies seizure, denies change in speech.  Positive for headaches periodically Endocrine: Denies dry mouth, denies flushing, denies heat intolerance, denies cold intolerance, denies excessive thirst, denies polyuria, denies nocturia Hematological: Denies easy bleeding, excessive bleeding, easy bruising, denies enlarged lymph nodes Allergy/immunological: Denies hives denies frequent infections, +at risk for infection due to chemotherapy  Objective:  Vascular  Examination: Capillary refill time immediate x 10 digits  Dorsalis pedis 2/4 b/l Posterior tibial pulses 2/4 b/l Digital hair x 10 digits was sparse Skin temperature gradient WNL  Dermatological Examination: Skin noted to be dry and cracked on the plantar aspect of the left foot. No erythema, no edema, no drainage Toenails 1-5 b/l discolored, thick, dystrophic with subungual debris and pain with palpation to nailbeds due to thickness of nails.  Musculoskeletal: Muscle strength 5/5 to all muscle groups b/l  Neurological: Sensation with 10 gram monofilament intact  Vibratory sensation intact  Assessment: 1. Painful  onychomycosis toenails 1-5 b/l 2. Peripheral neuropathy secondary to chemotherapy  Plan: 1. Advised patient to avoid walking barefoot and to purchase [employer] hard-soled house slippers to wear around the house. 2. Toenails 1-5 b/l were debrided in length and girth without iatrogenic bleeding. 3. Patient to continue soft, supportive shoe gear 4. Patient to report any pedal injuries to medical professional  5. Follow up 3 months. Patient/POA to call should there be a concern in the interim.

## 2017-12-24 ENCOUNTER — Ambulatory Visit (HOSPITAL_COMMUNITY): Payer: Medicare Other

## 2017-12-24 ENCOUNTER — Telehealth: Payer: Self-pay | Admitting: Oncology

## 2017-12-24 NOTE — Telephone Encounter (Signed)
Patty [name] called and asked if her infusion on 01/05/18 can be changed to 01/12/18.  Her daughter is coming to visit from [place] on 01/07/18 and she does not want to feel bad while she is there.  She is also concerned because the next cycle would fall on Christmas Eve and again she does not want to feel sick.  Advised her that I will check and call her back.

## 2017-12-24 NOTE — Telephone Encounter (Signed)
Discussed moving dates for chemotherapy with Mikey Bussing, NP who recommended that [facility] see Dr. Alvy Bimler on 01/05/18 before the infusion appointment is canceled.  Called Zariah back and discussed the plan to see Dr. Alvy Bimler on 01/05/18 and then decide if the chemotherapy can be rescheduled to 12/10.  Dania verbalized understanding and agreement.

## 2017-12-28 ENCOUNTER — Telehealth: Payer: Self-pay | Admitting: Podiatry

## 2017-12-28 NOTE — Telephone Encounter (Signed)
"  I want to make you aware that, I was in there on Monday, last week and the reason I came in was to get my nails cut, from a referral." I have cancer Im going though Chemo Therapy now, and no one thought to tell me that I needed to get my nails cut. On my discharge paperwork, I realized it said a entirely different reason for me coming in. I want to make sure the reason I came in was what, was really on the paperwork. She did mention some cracked skin, under my toe." Now im experiencing some sensitivity in my toe that was cut. I just wanna make sure that the information is corrected. I am just a stickler on everything being correct."I came in from a referral from my Doctor, and it was initially just to get my nails cut." Im not sur what you can decipher, from what I just told you, but the real reason for my call, is just to get my discharge paperwork, changed to what I came in for, for insurance purposes."

## 2017-12-28 NOTE — Telephone Encounter (Signed)
I informed pt the neuropathy was noted from the neurological test performed by Dr.Galaway and debridement is the procedure and onychomycosis is the condition of her nail. Pt states understanding.

## 2017-12-29 DIAGNOSIS — F4323 Adjustment disorder with mixed anxiety and depressed mood: Secondary | ICD-10-CM | POA: Diagnosis not present

## 2018-01-04 ENCOUNTER — Inpatient Hospital Stay: Payer: Medicare Other | Attending: Hematology and Oncology

## 2018-01-04 ENCOUNTER — Telehealth: Payer: Self-pay | Admitting: Oncology

## 2018-01-04 ENCOUNTER — Ambulatory Visit (HOSPITAL_COMMUNITY)
Admission: RE | Admit: 2018-01-04 | Discharge: 2018-01-04 | Disposition: A | Discharge status: Home / Self Care | Payer: Medicare Other | Source: Ambulatory Visit | Attending: Hematology and Oncology | Admitting: Hematology and Oncology

## 2018-01-04 ENCOUNTER — Inpatient Hospital Stay: Payer: Medicare Other

## 2018-01-04 DIAGNOSIS — E440 Moderate protein-calorie malnutrition: Secondary | ICD-10-CM | POA: Diagnosis not present

## 2018-01-04 DIAGNOSIS — C562 Malignant neoplasm of left ovary: Secondary | ICD-10-CM | POA: Diagnosis not present

## 2018-01-04 DIAGNOSIS — I5040 Unspecified combined systolic (congestive) and diastolic (congestive) heart failure: Secondary | ICD-10-CM | POA: Diagnosis not present

## 2018-01-04 DIAGNOSIS — I700 Atherosclerosis of aorta: Secondary | ICD-10-CM | POA: Insufficient documentation

## 2018-01-04 DIAGNOSIS — C569 Malignant neoplasm of unspecified ovary: Secondary | ICD-10-CM

## 2018-01-04 DIAGNOSIS — Z9221 Personal history of antineoplastic chemotherapy: Secondary | ICD-10-CM | POA: Diagnosis not present

## 2018-01-04 DIAGNOSIS — D3910 Neoplasm of uncertain behavior of unspecified ovary: Secondary | ICD-10-CM

## 2018-01-04 DIAGNOSIS — Z5111 Encounter for antineoplastic chemotherapy: Secondary | ICD-10-CM | POA: Diagnosis not present

## 2018-01-04 DIAGNOSIS — R911 Solitary pulmonary nodule: Secondary | ICD-10-CM | POA: Insufficient documentation

## 2018-01-04 DIAGNOSIS — K669 Disorder of peritoneum, unspecified: Secondary | ICD-10-CM | POA: Insufficient documentation

## 2018-01-04 LAB — CBC WITH DIFFERENTIAL/PLATELET
Abs Immature Granulocytes: 0.01 10*3/uL (ref 0.00–0.07)
Basophils Absolute: 0 10*3/uL (ref 0.0–0.1)
Basophils Relative: 0 %
EOS PCT: 2 %
Eosinophils Absolute: 0.1 10*3/uL (ref 0.0–0.5)
HCT: 34.8 % — ABNORMAL LOW (ref 36.0–46.0)
HEMOGLOBIN: 11.2 g/dL — AB (ref 12.0–15.0)
Immature Granulocytes: 0 %
LYMPHS ABS: 1.2 10*3/uL (ref 0.7–4.0)
LYMPHS PCT: 22 %
MCH: 33.5 pg (ref 26.0–34.0)
MCHC: 32.2 g/dL (ref 30.0–36.0)
MCV: 104.2 fL — ABNORMAL HIGH (ref 80.0–100.0)
MONO ABS: 0.7 10*3/uL (ref 0.1–1.0)
Monocytes Relative: 13 %
Neutro Abs: 3.4 10*3/uL (ref 1.7–7.7)
Neutrophils Relative %: 63 %
Platelets: 156 10*3/uL (ref 150–400)
RBC: 3.34 MIL/uL — ABNORMAL LOW (ref 3.87–5.11)
RDW: 19.5 % — ABNORMAL HIGH (ref 11.5–15.5)
WBC: 5.4 10*3/uL (ref 4.0–10.5)
nRBC: 0 % (ref 0.0–0.2)

## 2018-01-04 LAB — COMPREHENSIVE METABOLIC PANEL
ALT: 17 U/L (ref 0–44)
AST: 28 U/L (ref 15–41)
Albumin: 3.4 g/dL — ABNORMAL LOW (ref 3.5–5.0)
Alkaline Phosphatase: 140 U/L — ABNORMAL HIGH (ref 38–126)
Anion gap: 11 (ref 5–15)
BUN: 16 mg/dL (ref 8–23)
CO2: 30 mmol/L (ref 22–32)
Calcium: 9.4 mg/dL (ref 8.9–10.3)
Chloride: 103 mmol/L (ref 98–111)
Creatinine, Ser: 0.91 mg/dL (ref 0.44–1.00)
GFR calc Af Amer: >60 mL/min (ref >60)
GFR calc non Af Amer: 58 mL/min — ABNORMAL LOW (ref >60)
Glucose, Bld: 97 mg/dL (ref 70–99)
Potassium: 3.5 mmol/L (ref 3.5–5.1)
Sodium: 144 mmol/L (ref 135–145)
Total Bilirubin: 0.7 mg/dL (ref 0.3–1.2)
Total Protein: 6.8 g/dL (ref 6.5–8.1)

## 2018-01-04 MED ORDER — SODIUM CHLORIDE (PF) 0.9 % IJ SOLN
INTRAMUSCULAR | Status: AC
  Filled 2018-01-04: qty 50

## 2018-01-04 MED ORDER — SODIUM CHLORIDE 0.9% FLUSH
10.0000 mL | Freq: Once | INTRAVENOUS | Status: AC
  Administered 2018-01-04: 10 mL
  Filled 2018-01-04: qty 10

## 2018-01-04 MED ORDER — IOHEXOL 300 MG/ML  SOLN
100.0000 mL | Freq: Once | INTRAMUSCULAR | Status: AC | PRN
  Administered 2018-01-04: 100 mL via INTRAVENOUS

## 2018-01-04 MED ORDER — HEPARIN SOD (PORK) LOCK FLUSH 100 UNIT/ML IV SOLN
INTRAVENOUS | Status: AC
  Filled 2018-01-04: qty 5

## 2018-01-04 NOTE — Telephone Encounter (Signed)
OK, I will discuss this when I see her

## 2018-01-04 NOTE — Telephone Encounter (Signed)
Patty [name] called and wanted to make sure that Dr. Alvy Bimler is aware that she wants to move her chemotherapy off a week.  Her daughter is visiting from [place] this Thursday through Monday and she is also worried about having chemo on Christmas Eve.  Advised her that Dr. Alvy Bimler will be notified.

## 2018-01-05 ENCOUNTER — Inpatient Hospital Stay: Payer: Medicare Other

## 2018-01-05 ENCOUNTER — Encounter: Payer: Self-pay | Admitting: Hematology and Oncology

## 2018-01-05 ENCOUNTER — Inpatient Hospital Stay (HOSPITAL_BASED_OUTPATIENT_CLINIC_OR_DEPARTMENT_OTHER): Payer: Medicare Other | Admitting: Hematology and Oncology

## 2018-01-05 ENCOUNTER — Telehealth: Payer: Self-pay | Admitting: Hematology and Oncology

## 2018-01-05 DIAGNOSIS — Z9221 Personal history of antineoplastic chemotherapy: Secondary | ICD-10-CM

## 2018-01-05 DIAGNOSIS — I5040 Unspecified combined systolic (congestive) and diastolic (congestive) heart failure: Secondary | ICD-10-CM | POA: Diagnosis not present

## 2018-01-05 DIAGNOSIS — C569 Malignant neoplasm of unspecified ovary: Secondary | ICD-10-CM

## 2018-01-05 DIAGNOSIS — Z5111 Encounter for antineoplastic chemotherapy: Secondary | ICD-10-CM | POA: Diagnosis not present

## 2018-01-05 DIAGNOSIS — E440 Moderate protein-calorie malnutrition: Secondary | ICD-10-CM

## 2018-01-05 DIAGNOSIS — D3910 Neoplasm of uncertain behavior of unspecified ovary: Secondary | ICD-10-CM

## 2018-01-05 LAB — INHIBIN B: Inhibin B: 161.8 pg/mL — ABNORMAL HIGH (ref 0.0–16.9)

## 2018-01-05 NOTE — Assessment & Plan Note (Addendum)
She will continue medical management She has no clinical signs or symptoms of congestive heart failure Previously noted hemorrhage/hematoma has resolved I have sent a message to her cardiologist to decide whether she needs to resume anticoagulation therapy

## 2018-01-05 NOTE — Progress Notes (Signed)
[place] OFFICE PROGRESS NOTE  Patient Care Team: [name], [name], [name], [name], [name], MD as Consulting Physician (Cardiology)  ASSESSMENT & PLAN:  Granulosa cell tumor of ovary I have reviewed imaging studies with the patient She has positive response to chemotherapy. Overall, she tolerated treatment very well without major side effects. I recommend we continue the same treatment and repeat imaging study again in 3 months.  She agree with the plan of care. She would like to reschedule her treatment till next week to spend time with family which I think is reasonable We will also try to reschedule her appointment to see GYN oncologist  Combined systolic and diastolic heart failure, NYHA class 3 ([place]) She will continue medical management She has no clinical signs or symptoms of congestive heart failure Previously noted hemorrhage/hematoma has resolved I have sent a message to her cardiologist to decide whether she needs to resume anticoagulation therapy   Protein-calorie malnutrition, moderate ([place]) Nutritional status has improved She has no further signs and symptoms of bowel obstruction Previously noted diarrhea has resolved She has no abdominal pain.   No orders of the defined types were placed in this encounter.   INTERVAL HISTORY: Please see below for problem oriented charting. She returns to review test results Last month, she developed diarrhea but that has subsequently resolved She denies abdominal pain, recent changes in bowel habits or bloating No recent exacerbation of congestive heart failure No recent infection.  SUMMARY OF ONCOLOGIC HISTORY:   Granulosa cell tumor of ovary   02/19/1998 Initial Diagnosis    The granulosa cell tumor  was initially diagnosed at [facility] in 2000, with recurrences in 2004, 2007 and spring 2011. The original operative note from 2000 and that pathology did not transfer into present EMR and I do not know laterality of the original tumor. The surgery in 2004 included resection of the tumor with small bowel resection and colectomy for diverticulosis. She had laparoscopic repair of ventral hernia in 2005. In 2007 she had resection of pelvic recurrence of tumor plus open repair of ventral hernia with mesh. Most recent intervention was laparoscopic resection of suprapubic involvement by Dr.Hoxworth in May 2011; she had a small and asymptomatic suprapubic hernia in 2011 which has been followed. All of her disease has been very indolent and has generally been detected by CT scans. Last CT AP was April 2013; [name], self-limited episode of bowel obstruction in May 2012.    08/24/2002 Pathology Results    SOFT TISSUE MASS, [name], [name], there are tumor cells present which appear histologically similar to those noted in the patient's previous granulosa cell tumor ([id number]) and therefore, consistent with recurrent/metastatic granulosa cell tumor. The findings correlate with the cytology material    12/06/2002 Pathology Results    1. SMALL INTESTINE, [name], SIGMOID, SEGMENTAL RESECTION: DIVERTICULOSIS WITH FOCAL MILD CHRONIC DIVERTICULITIS. SIX BENIGN LYMPH NODES. NO TUMOR IDENTIFIED.    05/12/2005 PET scan    Again identified are enlarged lymph nodes within the right posterior cervical region and left  external iliac region, as well as a large omental or peritoneal deposit just below the right rectus sheath.   All of these exhibit very low-level FDG uptake which likely reflects the hypometabolic state of this tumor.  Clearly these masses have increased over time, especially when compared with the examination dated 10/30/04.    10/01/2005 Pathology Results    1. ABDOMINAL WALL MASS: METASTATIC GRANULOSA CELL TUMOR.  2. LEFT ILIAC MASS: METASTATIC GRANULOSA CELL TUMOR.  COMMENT Both specimens are involved by metastatic tumor with features consistent with metastasis from the previously diagnosed adult granulosa cell tumor.     06/11/2007 Imaging    Stable CT the pelvis - no evidence of metastatic disease.    06/09/2008 Imaging    1.  Interval enlargement of small left perivesical nodule measuring 18 mm maximally.  This may reflect a lymph node but has a nonspecific appearance.  Attention to this area followup is recommended. 2.  Otherwise stable postsurgical appearance of the pelvis.    12/14/2008 Imaging    1.  Mild increase in size of low attenuation nodule in the left anterior pelvis indenting the urinary bladder, which now measures 2.8 cm compared with 1.8 cm on prior study.  Metastatic disease cannot be excluded. 2.  No other significant changes identified.  Stable diverticulosis and small suprapubic anterior abdominal wall hernia containing a loop of small bowel.    05/23/2009 Pathology Results    1. SOFT TISSUE MASS, SIMPLE EXCISION, : INVOLVEMENT BY NEOPLASM CONSISTENT WITH METASTATIC GRANULOSA CELL TUMOR.    06/08/2010 Imaging    1.  No acute findings. 2.  Interval progression of mild diffuse left renal atrophy and hypoperfusion, consistent with left renal artery stenosis. 3.  Stable small hiatal hernia, tiny periumbilical hernia, and small suprapubic hernia containing a loop of small bowel.  No evidence of bowel ischemia or obstruction. 4.  Diverticulosis.  No radiographic evidence of diverticulitis.    05/22/2011 Imaging    1.  Stable exam.  No new or progressive disease identified within  the abdomen or pelvis. 2.  Stable small hiatal hernia and suprapubic ventral hernia. Stable left renal atrophy, likely secondary to renal artery stenosis.    11/28/2011 Imaging    1.  No findings to suggest recurrent or metastatic disease in the abdomen or pelvis on today's examination. 2.  Indeterminate liver lesion in the right lobe of the liver is unchanged compared to numerous remote prior examinations. 3. Small suprapubic ventral hernia again noted. 4.  Extensive atherosclerosis including at least three vessel coronary artery disease.  5.  Postoperative changes, as above.     12/01/2016 Tumor Marker    Patient's tumor was tested for the following markers: Inhibin B Results of the tumor marker test revealed 119.2    12/11/2016 Imaging    Multiple new soft tissue masses in left pelvis and central small bowel mesentery, consistent with recurrent/metastatic carcinoma.     03/25/2017 Imaging    1. Omental and mesenteric tumor implants throughout the abdomen and pelvis with mixed findings when compared the prior study. Some of these lesions are clearly smaller and others are clearly larger. No new lesions are identified. 2. Stable severe atherosclerotic calcifications. 3. Stable low anterior abdominal wall hernia. 4. Stable low-attenuation lesion involving the right hepatic lobe with adjacent calcifications or surgical clips.    10/12/2017 Imaging    Extraperitoneal fluid/hemorrhage related to the patient's dominant left pelvic tumor, as described above.  Progression of the patient's metastatic ovarian  cancer, as described above. Dominant cystic/solid mass in the left adnexa/lower pelvis has increased in size. Peritoneal disease/omental caking is mildly progressive. New pulmonary nodule at the right lung base, indeterminate.     10/12/2017 - 10/17/2017 Hospital Admission    She was admitted for retroperitoneal bleed.    10/16/2017 Imaging    1. Improving peritoneal hemorrhage. 2. No  significant change in cystic and solid left pelvic mass and smaller anterior peritoneal cystic and solid mass compatible with tumor implant. 3. No evidence of metastatic disease elsewhere in the abdomen or pelvis. 4. Cardiomegaly. 5.  Calcific coronary artery and aortic atherosclerosis.  Aortic Atherosclerosis (ICD10-I70.0).    10/27/2017 Tumor Marker    Patient's tumor was tested for the following markers: Inhibin B Results of the tumor marker test revealed 98.4    10/28/2017 Procedure    Status post right IJ port catheter placement.    10/29/2017 -  Chemotherapy    The patient had carboplatin    11/24/2017 Tumor Marker    Patient's tumor was tested for the following markers: Inhibin B Results of the tumor marker test revealed 113.6    01/04/2018 Imaging    1. The left adnexal mass appears mildly decreased in size from previous exam. Associated hemorrhage with left lower abdominal hematomas have resolved in the interval. Low-density central mesenteric lesion is also slightly decreased in size in the interval. The solid and cystic lesion along the undersurface of the ventral abdominal wall is mildly increased in size in the interval. Other peritoneal nodules are either stable or decreased in the interval. 2. Right lower lobe pulmonary nodule is unchanged. 3. No new sites of disease identified. 4. Aortic Atherosclerosis (ICD10-I70.0).     Ovarian cancer ([name])   10/19/2017 Initial Diagnosis    Ovarian cancer ([place])     REVIEW OF SYSTEMS:   Constitutional: Denies fevers, chills or abnormal weight loss Eyes: Denies blurriness of vision Ears, nose, mouth, throat, and face: Denies mucositis or sore throat Respiratory: Denies cough, dyspnea or wheezes Cardiovascular: Denies palpitation, chest discomfort or lower extremity swelling Gastrointestinal:  Denies nausea, heartburn or change in bowel habits Skin: Denies abnormal skin rashes Lymphatics: Denies new lymphadenopathy or easy  bruising Neurological:Denies numbness, tingling or new weaknesses Behavioral/Psych: Mood is stable, no new changes  All other systems were reviewed with the patient and are negative.  I have reviewed the past medical history, past surgical history, social history and family history with the patient and they are unchanged from previous note.  ALLERGIES:  is allergic to bee venom; other; amiodarone; and prednisone.  MEDICATIONS:  Current Outpatient Medications  Medication Sig Dispense Refill  . acetaminophen (TYLENOL) 500 MG tablet Take 500 mg by mouth at bedtime as needed for mild pain.     Marland Kitchen atorvastatin (LIPITOR) 80 MG tablet TAKE 1 TABLET BY MOUTH EVERY MORNING (Patient taking differently: Take 80 mg by mouth every morning. ) 90 tablet 2  . beta carotene w/minerals (OCUVITE) tablet Take 1 tablet by mouth 2 (two) times daily.     . Calcium Carb-Cholecalciferol (CALTRATE 600+D) 600-800 MG-UNIT TABS Take 1 tablet by mouth every evening.     . Coenzyme Q-10 100 MG capsule Take 100-200 mg by mouth daily.     Marland Kitchen diltiazem (CARDIZEM CD) 120 MG 24 hr capsule TAKE ONE CAPSULE BY MOUTH AT BEDTIME (Patient taking differently: Take 120 mg by mouth at bedtime. ) 90 capsule 3  . diltiazem (CARDIZEM CD) 240 MG 24 hr capsule TAKE  ONE CAPSULE BY MOUTH DAILY WITH BREAKFAST (Patient taking differently: Take 240 mg by mouth daily. ) 30 capsule 11  . docusate sodium (COLACE) 100 MG capsule Take 100 mg by mouth 2 (two) times daily.    . furosemide (LASIX) 40 MG tablet Take 80 mg by mouth daily.    Marland Kitchen gabapentin (NEURONTIN) 300 MG capsule Take 1 capsule (300 mg total) by mouth 2 (two) times daily. 180 capsule 2  . HYDROmorphone (DILAUDID) 2 MG tablet Take 1 tablet (2 mg total) by mouth every 4 (four) hours as needed for severe pain. 30 tablet 0  . isosorbide mononitrate (IMDUR) 60 MG 24 hr tablet TAKE 1 TABLET BY MOUTH DAILY (Patient taking differently: Take 60 mg by mouth daily. ) 30 tablet 9  . KLOR-CON M20 20  MEQ tablet TAKE 1 TABLET BY MOUTH EVERY DAY (Patient taking differently: Take 20 mEq by mouth at bedtime. ) 90 tablet 3  . lidocaine-prilocaine (EMLA) cream Apply 1 application topically as needed. (Patient taking differently: Apply 1 application topically as needed (port access). ) 30 g 6  . metoprolol tartrate (LOPRESSOR) 100 MG tablet Take 1 tablet (100 mg total) by mouth 2 (two) times daily. 60 tablet 9  . Multiple Vitamin (MULTIVITAMIN) capsule Take 1 capsule by mouth daily.    . nitroGLYCERIN (NITROSTAT) 0.4 MG SL tablet PLACE 1 TABLET (0.4 MG TOTAL) UNDER THE TONGUE EVERY 5 (FIVE) MINUTES AS NEEDED. FOR CHEST PAIN (Patient not taking: No sig reported) 25 tablet 6  . NON FORMULARY Place 1 each into the nose at bedtime. CPAP setting 3.5    . Omega-3 Fatty Acids (FISH OIL) 1200 MG CAPS Take 1,200 mg by mouth daily.     . ondansetron (ZOFRAN) 8 MG tablet Take 1 tablet (8 mg total) by mouth every 8 (eight) hours as needed for nausea. 30 tablet 3  . prochlorperazine (COMPAZINE) 10 MG tablet Take 1 tablet (10 mg total) by mouth every 6 (six) hours as needed for nausea or vomiting. 30 tablet 0  . spironolactone (ALDACTONE) 25 MG tablet Take 25 mg by mouth at bedtime as needed (swelling in ankles).      No current facility-administered medications for this visit.     PHYSICAL EXAMINATION: ECOG PERFORMANCE STATUS: 1 - Symptomatic but completely ambulatory  Vitals:   01/05/18 0807  BP: (!) 143/58  Pulse: 77  Resp: 16  Temp: 98.2 F (36.8 C)  SpO2: 95%   Filed Weights   01/05/18 0807  Weight: 169 lb 12.8 oz (77 kg)    GENERAL:alert, no distress and comfortable SKIN: skin color, texture, turgor are normal, no rashes or significant lesions Musculoskeletal:no cyanosis of digits and no clubbing  NEURO: alert & oriented x 3 with fluent speech, no focal motor/sensory deficits  LABORATORY DATA:  I have reviewed the data as listed    Component Value Date/Time   NA 144 01/04/2018 1036   NA  142 12/14/2014 1324   K 3.5 01/04/2018 1036   K 4.2 12/14/2014 1324   CL 103 01/04/2018 1036   CL 105 05/26/2012 1111   CO2 30 01/04/2018 1036   CO2 30 (H) 12/14/2014 1324   GLUCOSE 97 01/04/2018 1036   GLUCOSE 130 12/14/2014 1324   GLUCOSE 89 05/26/2012 1111   BUN 16 01/04/2018 1036   BUN 19.6 12/01/2016 1116   CREATININE 0.91 01/04/2018 1036   CREATININE 0.86 12/17/2017 0828   CREATININE 0.9 12/01/2016 1116   CALCIUM 9.4 01/04/2018 1036  CALCIUM 9.1 12/14/2014 1324   PROT 6.8 01/04/2018 1036   PROT 6.5 10/08/2017 0905   PROT 6.5 12/14/2014 1324   ALBUMIN 3.4 (L) 01/04/2018 1036   ALBUMIN 3.9 10/08/2017 0905   ALBUMIN 3.4 (L) 12/14/2014 1324   AST 28 01/04/2018 1036   AST 25 12/17/2017 0828   AST 29 12/14/2014 1324   ALT 17 01/04/2018 1036   ALT 26 12/17/2017 0828   ALT 20 12/14/2014 1324   ALKPHOS 140 (H) 01/04/2018 1036   ALKPHOS 103 12/14/2014 1324   BILITOT 0.7 01/04/2018 1036   BILITOT 0.5 12/17/2017 0828   BILITOT 0.77 12/14/2014 1324   GFRNONAA 58 (L) 01/04/2018 1036   GFRNONAA 60 (L) 12/17/2017 0828   GFRAA >60 01/04/2018 1036   GFRAA >60 12/17/2017 0828    No results found for: SPEP, UPEP  Lab Results  Component Value Date   WBC 5.4 01/04/2018   NEUTROABS 3.4 01/04/2018   HGB 11.2 (L) 01/04/2018   HCT 34.8 (L) 01/04/2018   MCV 104.2 (H) 01/04/2018   PLT 156 01/04/2018      Chemistry      Component Value Date/Time   NA 144 01/04/2018 1036   NA 142 12/14/2014 1324   K 3.5 01/04/2018 1036   K 4.2 12/14/2014 1324   CL 103 01/04/2018 1036   CL 105 05/26/2012 1111   CO2 30 01/04/2018 1036   CO2 30 (H) 12/14/2014 1324   BUN 16 01/04/2018 1036   BUN 19.6 12/01/2016 1116   CREATININE 0.91 01/04/2018 1036   CREATININE 0.86 12/17/2017 0828   CREATININE 0.9 12/01/2016 1116      Component Value Date/Time   CALCIUM 9.4 01/04/2018 1036   CALCIUM 9.1 12/14/2014 1324   ALKPHOS 140 (H) 01/04/2018 1036   ALKPHOS 103 12/14/2014 1324   AST 28 01/04/2018  1036   AST 25 12/17/2017 0828   AST 29 12/14/2014 1324   ALT 17 01/04/2018 1036   ALT 26 12/17/2017 0828   ALT 20 12/14/2014 1324   BILITOT 0.7 01/04/2018 1036   BILITOT 0.5 12/17/2017 0828   BILITOT 0.77 12/14/2014 1324       RADIOGRAPHIC STUDIES: I have personally reviewed the radiological images as listed and agreed with the findings in the report. Ct Abdomen Pelvis W Contrast  Result Date: 01/04/2018 CLINICAL DATA:  Followup metastatic ovarian cancer. EXAM: CT ABDOMEN AND PELVIS WITH CONTRAST TECHNIQUE: Multidetector CT imaging of the abdomen and pelvis was performed using the standard protocol following bolus administration of intravenous contrast. CONTRAST:  155mL OMNIPAQUE IOHEXOL 300 MG/ML  SOLN COMPARISON:  10/16/2017 FINDINGS: Lower chest: Cardiac enlargement. Aortic atherosclerosis and 3 vessel coronary artery atherosclerotic calcifications. Pulmonary nodule in the anterior right lower lobe is stable measuring 1 cm, image 27/7. Hepatobiliary: Stable 1.9 cm low-density structure within the posterior right hepatic lobe with coarse calcifications, image 31/2. No additional focal liver abnormalities. Previous cholecystectomy. No biliary ductal dilatation. Pancreas: Unremarkable. No pancreatic ductal dilatation or surrounding inflammatory changes. Spleen: Normal in size without focal abnormality. Adrenals/Urinary Tract: Normal adrenal glands. There is asymmetric atrophy of the left kidney, similar to previous exam. Right renal cortical scarring is again noted. No kidney mass or hydronephrosis identified bilaterally. The urinary bladder appears normal. Stomach/Bowel: Small hiatal hernia. There is no abnormal dilatation of the small or large bowel loops. Vascular/Lymphatic: Aortic atherosclerosis. No aneurysm. The portal vein and hepatic veins are patent. No retroperitoneal adenopathy. No pelvic adenopathy. Reproductive: Status post hysterectomy. Left adnexal mass measures 6.1  by 4.0 by 5.4 cm,  image 67/2. Previously 8.2 x 5.6 by 5.6. Other: Scattered peritoneal lesions are again noted. Low-density lesion within the central mesentery measures 3.2 by 3.4 cm, image 49/2. Previously 3.5 x 3.8 cm. Along the undersurface of the ventral abdominal wall there is a solid and cystic lesion which measures 3.8 x 2.0 cm, image 57/2. The previously 3.2 by 1.9 cm. Left pelvic sidewall soft tissue nodule measures 1.5 cm, image 72/2. Unchanged. Adjacent small nodule measuring 1.1 cm is identified, image 72/2. Previously 2.1 cm. Interval resolution of previous hemorrhage within the lower abdomen. Musculoskeletal: No acute or significant osseous findings. IMPRESSION: 1. The left adnexal mass appears mildly decreased in size from previous exam. Associated hemorrhage with left lower abdominal hematomas have resolved in the interval. Low-density central mesenteric lesion is also slightly decreased in size in the interval. The solid and cystic lesion along the undersurface of the ventral abdominal wall is mildly increased in size in the interval. Other peritoneal nodules are either stable or decreased in the interval. 2. Right lower lobe pulmonary nodule is unchanged. 3. No new sites of disease identified. 4.  Aortic Atherosclerosis (ICD10-I70.0). Electronically Signed   By: Kerby Moors M.D.   On: 01/04/2018 14:36    All questions were answered. The patient knows to call the clinic with any problems, questions or concerns. No barriers to learning was detected.  I spent 15 minutes counseling the patient face to face. The total time spent in the appointment was 20 minutes and more than 50% was on counseling and review of test results  Heath Lark, MD 01/05/2018 8:24 AM

## 2018-01-05 NOTE — Assessment & Plan Note (Signed)
Nutritional status has improved She has no further signs and symptoms of bowel obstruction Previously noted diarrhea has resolved She has no abdominal pain.

## 2018-01-05 NOTE — Telephone Encounter (Signed)
Gave avs and calendar ° °

## 2018-01-05 NOTE — Assessment & Plan Note (Addendum)
I have reviewed imaging studies with the patient She has positive response to chemotherapy. Overall, she tolerated treatment very well without major side effects. I recommend we continue the same treatment and repeat imaging study again in 3 months.  She agree with the plan of care. She would like to reschedule her treatment till next week to spend time with family which I think is reasonable We will also try to reschedule her appointment to see GYN oncologist

## 2018-01-06 ENCOUNTER — Telehealth: Payer: Self-pay | Admitting: Oncology

## 2018-01-06 ENCOUNTER — Ambulatory Visit: Payer: Medicare Other | Admitting: Gynecologic Oncology

## 2018-01-06 NOTE — Telephone Encounter (Signed)
Left a message for [facility] regarding appointment with Dr. Denman George on 02/08/2018.

## 2018-01-08 ENCOUNTER — Telehealth: Payer: Self-pay | Admitting: Pharmacist

## 2018-01-08 NOTE — Telephone Encounter (Signed)
Pt returned the call and explained to her to restart her Warfarin/Coumadin at previous dose of 2mg  daily except 1mg  on Monday and Friday. Pt states she has a Chemo treatment on next Tuesday and that she has been very cautious with her diet. Appt set for pt.

## 2018-01-08 NOTE — Telephone Encounter (Signed)
LMOM to restart warfarin. Will plan to restart with previous warfarin dose 2mg  daily except 1mg  on Monday and Friday and check INR in 5-7 days.

## 2018-01-08 NOTE — Telephone Encounter (Signed)
-----   Message from [name], MD sent at 01/07/2018  5:24 PM EST ----- Regarding: FW: update on patient I would restart her coumadin since the bleeding issue has stopped. COntinue to hold Plavix or now.   JV ----- Message ----- From: Heath Lark, MD Sent: 01/05/2018   8:15 AM EST To: [name], MD Subject: update on patient                              Hi,  She is doing very well on chemo. Recent scans showed good results. She has no further bleeding. The plan is to proceed with a few more months of treatment. I would like you to call her to see if she needs to be restarted on anticoagulation therapy. I will defer the decision to you  Thank you Ni Alvy Bimler

## 2018-01-12 ENCOUNTER — Inpatient Hospital Stay: Payer: Medicare Other

## 2018-01-12 VITALS — BP 124/72 | HR 73 | Temp 97.8°F | Resp 16

## 2018-01-12 DIAGNOSIS — C569 Malignant neoplasm of unspecified ovary: Secondary | ICD-10-CM

## 2018-01-12 DIAGNOSIS — Z5111 Encounter for antineoplastic chemotherapy: Secondary | ICD-10-CM | POA: Diagnosis not present

## 2018-01-12 DIAGNOSIS — I5040 Unspecified combined systolic (congestive) and diastolic (congestive) heart failure: Secondary | ICD-10-CM | POA: Diagnosis not present

## 2018-01-12 DIAGNOSIS — E440 Moderate protein-calorie malnutrition: Secondary | ICD-10-CM | POA: Diagnosis not present

## 2018-01-12 DIAGNOSIS — Z9221 Personal history of antineoplastic chemotherapy: Secondary | ICD-10-CM | POA: Diagnosis not present

## 2018-01-12 LAB — COMPREHENSIVE METABOLIC PANEL
ALT: 14 U/L (ref 0–44)
ANION GAP: 8 (ref 5–15)
AST: 23 U/L (ref 15–41)
Albumin: 3.2 g/dL — ABNORMAL LOW (ref 3.5–5.0)
Alkaline Phosphatase: 119 U/L (ref 38–126)
BUN: 19 mg/dL (ref 8–23)
CO2: 32 mmol/L (ref 22–32)
Calcium: 9 mg/dL (ref 8.9–10.3)
Chloride: 104 mmol/L (ref 98–111)
Creatinine, Ser: 0.94 mg/dL (ref 0.44–1.00)
GFR calc Af Amer: >60 mL/min (ref >60)
GFR calc non Af Amer: 55 mL/min — ABNORMAL LOW (ref >60)
GLUCOSE: 106 mg/dL — AB (ref 70–99)
Potassium: 3.4 mmol/L — ABNORMAL LOW (ref 3.5–5.1)
Sodium: 144 mmol/L (ref 135–145)
Total Bilirubin: 0.6 mg/dL (ref 0.3–1.2)
Total Protein: 6.3 g/dL — ABNORMAL LOW (ref 6.5–8.1)

## 2018-01-12 LAB — CBC WITH DIFFERENTIAL/PLATELET
Abs Immature Granulocytes: 0.01 10*3/uL (ref 0.00–0.07)
BASOS ABS: 0 10*3/uL (ref 0.0–0.1)
Basophils Relative: 1 %
Eosinophils Absolute: 0.2 10*3/uL (ref 0.0–0.5)
Eosinophils Relative: 4 %
HEMATOCRIT: 33.7 % — AB (ref 36.0–46.0)
Hemoglobin: 10.9 g/dL — ABNORMAL LOW (ref 12.0–15.0)
Immature Granulocytes: 0 %
Lymphocytes Relative: 26 %
Lymphs Abs: 1.1 10*3/uL (ref 0.7–4.0)
MCH: 33.7 pg (ref 26.0–34.0)
MCHC: 32.3 g/dL (ref 30.0–36.0)
MCV: 104.3 fL — ABNORMAL HIGH (ref 80.0–100.0)
Monocytes Absolute: 0.4 10*3/uL (ref 0.1–1.0)
Monocytes Relative: 11 %
Neutro Abs: 2.4 10*3/uL (ref 1.7–7.7)
Neutrophils Relative %: 58 %
Platelets: 116 10*3/uL — ABNORMAL LOW (ref 150–400)
RBC: 3.23 MIL/uL — ABNORMAL LOW (ref 3.87–5.11)
RDW: 18.8 % — ABNORMAL HIGH (ref 11.5–15.5)
WBC: 4.1 10*3/uL (ref 4.0–10.5)
nRBC: 0 % (ref 0.0–0.2)

## 2018-01-12 MED ORDER — HEPARIN SOD (PORK) LOCK FLUSH 100 UNIT/ML IV SOLN
500.0000 [IU] | Freq: Once | INTRAVENOUS | Status: AC | PRN
  Administered 2018-01-12: 500 [IU]
  Filled 2018-01-12: qty 5

## 2018-01-12 MED ORDER — PALONOSETRON HCL INJECTION 0.25 MG/5ML
0.2500 mg | Freq: Once | INTRAVENOUS | Status: AC
  Administered 2018-01-12: 0.25 mg via INTRAVENOUS

## 2018-01-12 MED ORDER — SODIUM CHLORIDE 0.9 % IV SOLN
Freq: Once | INTRAVENOUS | Status: AC
  Administered 2018-01-12: 11:00:00 via INTRAVENOUS
  Filled 2018-01-12: qty 5

## 2018-01-12 MED ORDER — SODIUM CHLORIDE 0.9 % IV SOLN
Freq: Once | INTRAVENOUS | Status: AC
  Administered 2018-01-12: 10:00:00 via INTRAVENOUS
  Filled 2018-01-12: qty 250

## 2018-01-12 MED ORDER — SODIUM CHLORIDE 0.9 % IV SOLN
310.0000 mg | Freq: Once | INTRAVENOUS | Status: AC
  Administered 2018-01-12: 310 mg via INTRAVENOUS
  Filled 2018-01-12: qty 31

## 2018-01-12 MED ORDER — SODIUM CHLORIDE 0.9% FLUSH
10.0000 mL | INTRAVENOUS | Status: DC | PRN
  Administered 2018-01-12: 10 mL
  Filled 2018-01-12: qty 10

## 2018-01-12 MED ORDER — PALONOSETRON HCL INJECTION 0.25 MG/5ML
INTRAVENOUS | Status: AC
  Filled 2018-01-12: qty 5

## 2018-01-12 NOTE — Patient Instructions (Signed)
[place] [facility] Discharge Instructions for Patients Receiving Chemotherapy  Today you received the following chemotherapy agents:  carboplatin (Paraplatin)  To help prevent nausea and vomiting after your treatment, we encourage you to take your nausea medication as prescribed.   If you develop nausea and vomiting that is not controlled by your nausea medication, call the clinic.   BELOW ARE SYMPTOMS THAT SHOULD BE REPORTED IMMEDIATELY:  *FEVER GREATER THAN 100.5 F  *CHILLS WITH OR WITHOUT FEVER  NAUSEA AND VOMITING THAT IS NOT CONTROLLED WITH YOUR NAUSEA MEDICATION  *UNUSUAL SHORTNESS OF BREATH  *UNUSUAL BRUISING OR BLEEDING  TENDERNESS IN MOUTH AND THROAT WITH OR WITHOUT PRESENCE OF ULCERS  *URINARY PROBLEMS  *BOWEL PROBLEMS  UNUSUAL RASH Items with * indicate a potential emergency and should be followed up as soon as possible.  Feel free to call the clinic should you have any questions or concerns. The clinic phone number is [phone].  Please show the [place] at check-in to the Emergency Department and triage nurse.

## 2018-01-13 ENCOUNTER — Ambulatory Visit (INDEPENDENT_AMBULATORY_CARE_PROVIDER_SITE_OTHER): Payer: Medicare Other | Admitting: *Deleted

## 2018-01-13 DIAGNOSIS — I4891 Unspecified atrial fibrillation: Secondary | ICD-10-CM | POA: Diagnosis not present

## 2018-01-13 DIAGNOSIS — Z5181 Encounter for therapeutic drug level monitoring: Secondary | ICD-10-CM

## 2018-01-13 LAB — POCT INR: INR: 1.5 — AB (ref 2.0–3.0)

## 2018-01-13 MED ORDER — WARFARIN SODIUM 2 MG PO TABS: ORAL_TABLET | ORAL | 1 refills | Status: DC

## 2018-01-13 NOTE — Patient Instructions (Signed)
Description   Continue taking  2 mg daily except 1 mg on Mondays and Fridays. Recheck INR in 1 week. Main [phone]. Coumadin Clinic # [phone]

## 2018-01-18 ENCOUNTER — Telehealth: Payer: Self-pay

## 2018-01-18 NOTE — Telephone Encounter (Signed)
Called and left a message asking her to call the nurse back. She called earlier and left a message on the GYN office number requesting a call back.

## 2018-01-19 ENCOUNTER — Telehealth: Payer: Self-pay

## 2018-01-19 NOTE — Telephone Encounter (Signed)
This patient called and left a message to inquire about her hair loss.  She wanted to know her options and a time frame of when she may lose all of her hair.  She stated in the message that she felt good other than the neuropathy.  I called her back and left her a message. I asked her to call back and we could discuss her options.

## 2018-01-20 ENCOUNTER — Ambulatory Visit (INDEPENDENT_AMBULATORY_CARE_PROVIDER_SITE_OTHER): Payer: Medicare Other | Admitting: *Deleted

## 2018-01-20 DIAGNOSIS — Z5181 Encounter for therapeutic drug level monitoring: Secondary | ICD-10-CM | POA: Diagnosis not present

## 2018-01-20 DIAGNOSIS — I4891 Unspecified atrial fibrillation: Secondary | ICD-10-CM | POA: Diagnosis not present

## 2018-01-20 LAB — POCT INR: INR: 2.1 (ref 2.0–3.0)

## 2018-01-20 NOTE — Patient Instructions (Signed)
Description   Continue taking 2 mg daily except 1 mg on Mondays and Fridays. Recheck INR in 1 week. Main [phone]. Coumadin Clinic # [phone]

## 2018-01-28 ENCOUNTER — Ambulatory Visit (INDEPENDENT_AMBULATORY_CARE_PROVIDER_SITE_OTHER): Payer: Medicare Other | Admitting: Pharmacist

## 2018-01-28 DIAGNOSIS — Z5181 Encounter for therapeutic drug level monitoring: Secondary | ICD-10-CM

## 2018-01-28 DIAGNOSIS — I4891 Unspecified atrial fibrillation: Secondary | ICD-10-CM | POA: Diagnosis not present

## 2018-01-28 LAB — POCT INR: INR: 2.4 (ref 2.0–3.0)

## 2018-01-28 NOTE — Patient Instructions (Signed)
Description   Continue taking 2 mg daily except 1 mg on Mondays and Fridays. Recheck INR in 1 week. Main [phone]. Coumadin Clinic # [phone]

## 2018-02-02 NOTE — Progress Notes (Signed)
Cardiology Office Note   Date:  02/04/2018   ID:  Patty [name], DOB Nov 27, 1930, MRN [id number]  PCP:  [name], MD    No chief complaint on file.  CAD  Wt Readings from Last 3 Encounters:  02/04/18 77.1 kg  01/05/18 77 kg  12/17/17 77.1 kg       History of Present Illness: Patty [name] is a 84 y.o. female  with history of CAD s/p multiple stents, chronic combined CHF, permanent atrial fib/flutter on Coumadin with tachy-brady syndrome s/p leadless PPM (with premature battery failure no longer active - being followed conservatively for now), probable CKD III per labs, granulosa cell carcinoma, HTN, HLD, LBBB, varicose veins, renal artery stenosis, PVD, diverticulitis, OSA who presents for f/u.  She has significant CAD with stenting x 2 to the LAD and x 1 to the LCx in 2000, rotational arthrectomy to proximal LCx and DES to LAD in 2014 and DES to LCx in 2014, along with DES to LAD for re-in-stent stenosis in 2015, and DES to ostial LAD in 2016. PVD is listed in chart but I cannot find further notes regarding this as LE doppler 2014 showed no evidence of significant LE disease   She was admittedin June 2018with exertional dyspnea and chest tightness and palpitations. She was found to be in rapid atrial fib/flutter and minimally elevated troponin. 2D Echo 07/11/16 showed moderate focal basal and mild concentric hypertrophy, EF 50-55%, no RWMA, septal dyssyngery c/w LBBB, mild AI, massively dilated LA, trivial TR, mild PR. Coumadin was held in prep for cath which was performed 07/14/16 showing 100% CTO of OM1 with L-L collaterals, 90% distal Cx, 100% distal PLOM, otherwise nonobstructive disease, normal LVEDP - unsuccessful PCI of the distal Cx due to inability to cross the lesion with a balloon. She was brought back 07/15/16 and underwent successful orbital atherectomy &DES to mid LCx.   She had an episode of Shingles around Memorial Day 2019. It was on the right side of her  chest.  She was treated with acyclovir. She still has some itching. She had constipation and nausea.  She treated this with peptobismol.  She initially thought it was a heart episode, but then with the rash, realized it was shingles.   She still has some trouble sleeping.    She had a lot of fatigue with the shingles.  SHe had a lot of stress with her son who is an alcoholic.  He had intrabdominal bleeding in 10/19  from tumors.  Anticoagulation was stopped for a while and restarted when approval was given by her oncology team.   She has been treated with chemo and has responded well.  No bleeding, even after Coumadin was started.  Has not had very high INRs.    Past Medical History:  Diagnosis Date  . Bleeding behind the abdominal cavity 10/2017  . Chronic anticoagulation - coumadin, CHADS2VASC=6 05/17/2015  . CKD (chronic kidney disease), stage III ([place])   . Combined systolic and diastolic heart failure ([place])   . Coronary artery disease    a. s/p multiple stents - stenting x 2 to the LAD and x 1 to the LCx in 2000, rotational arthrectomy to proximal LCx and DES to LAD in 2014 and DES to LCx in 2014, along with DES to LAD for re-in-stent stenosis in 2015, and DES to ostial LAD in 2016. b. 07/2016 - orbital atherectomy & DES to mid LCx.  . Diverticulitis   . Granulosa cell  carcinoma ([place])    abd; last episode was in 2009  . Hyperlipidemia   . Hypertension   . LBBB (left bundle branch block)       . Myocardial infarction ([place]) 2002  . Obesity   . OSA on CPAP   . Ovarian ca ([place]) 2019  . Pacemaker failure    a. Prior leadless PPM with premature battery failure, being managed conservatively without replacement.  . Peripheral vascular disease ([place])   . Permanent atrial fibrillation 2013  . Renal artery stenosis ([place])   . Tachycardia-bradycardia syndrome ([place])    a. s/p leadless pacemaker ([name]) implanted by Dr [name]  . Varicose veins     Past Surgical History:  Procedure  Laterality Date  . ABDOMINAL HYSTERECTOMY    . CARDIAC CATHETERIZATION  09/03/2007   EF 70%; Failed attempt at PCI to OM  . CARDIAC CATHETERIZATION  11/01/2003   EF 70%  . CARDIAC CATHETERIZATION N/A 12/14/2015   Procedure: Left Heart Cath and Coronary Angiography;  Surgeon: Burnell Blanks, MD;  Location: [place] CV LAB;  Service: Cardiovascular;  Laterality: N/A;  . CARDIOVERSION  12/31/2011   Procedure: CARDIOVERSION;  Surgeon: [name], MD;  Location: [facility] ENDOSCOPY;  Service: Cardiovascular;  Laterality: N/A;  . CARDIOVERSION N/A 12/31/2011   Procedure: CARDIOVERSION;  Surgeon: [name], MD;  Location: [facility] CATH LAB;  Service: Cardiovascular;  Laterality: N/A;  . CATARACT EXTRACTION, BILATERAL  2015  . CHOLECYSTECTOMY  1980's  . COLON SURGERY  2004   colectomy for diverticulosis  . CORONARY ANGIOPLASTY WITH STENT PLACEMENT  2000; 08/11/2012; 11/12/2012   3 + 2 LAD & CFX; 2nd CFX stent 11/12/2012  . CORONARY ATHERECTOMY N/A 07/15/2016   Procedure: Coronary Atherectomy;  Surgeon: Martinique, Peter M, MD;  Location: [place] CV LAB;  Service: Cardiovascular;  Laterality: N/A;  . CORONARY BALLOON ANGIOPLASTY N/A 07/14/2016   Procedure: Coronary Balloon Angioplasty;  Surgeon: Martinique, Peter M, MD;  Location: [place] CV LAB;  Service: Cardiovascular;  Laterality: N/A;  . CORONARY STENT INTERVENTION N/A 07/15/2016   Procedure: Coronary Stent Intervention;  Surgeon: Martinique, Peter M, MD;  Location: [place] CV LAB;  Service: Cardiovascular;  Laterality: N/A;  . FRACTIONAL FLOW RESERVE WIRE  10/07/2013   Procedure: [place];  Surgeon: [name], MD;  Location: [facility] CATH LAB;  Service: Cardiovascular;;  . granulosa tumor excision  2000; 2003; 2004; 2007   "all in my abdomen including small intestines, outside my ?uterus/etc" (08/11/2012)  . HERNIA REPAIR  2005   "laparoscopic"  . IR IMAGING GUIDED PORT INSERTION  10/28/2017  . LEFT HEART  CATH AND CORONARY ANGIOGRAPHY N/A 07/14/2016   Procedure: Left Heart Cath and Coronary Angiography;  Surgeon: Martinique, Peter M, MD;  Location: [place] CV LAB;  Service: Cardiovascular;  Laterality: N/A;  . LEFT HEART CATHETERIZATION WITH CORONARY ANGIOGRAM N/A 11/12/2012   Procedure: LEFT HEART CATHETERIZATION WITH CORONARY ANGIOGRAM;  Surgeon: [name], MD;  Location: [facility] CATH LAB;  Service: Cardiovascular;  Laterality: N/A;  . LEFT HEART CATHETERIZATION WITH CORONARY ANGIOGRAM N/A 10/07/2013   Procedure: LEFT HEART CATHETERIZATION WITH CORONARY ANGIOGRAM;  Surgeon: [name], MD;  Location: [facility] CATH LAB;  Service: Cardiovascular;  Laterality: N/A;  . LEFT HEART CATHETERIZATION WITH CORONARY ANGIOGRAM N/A 12/14/2013   Procedure: LEFT HEART CATHETERIZATION WITH CORONARY ANGIOGRAM;  Surgeon: Sinclair Grooms, MD;  Location: [facility] CATH LAB;  Service: Cardiovascular;  Laterality: N/A;  . LEFT HEART  CATHETERIZATION WITH CORONARY ANGIOGRAM N/A 05/16/2014   Procedure: LEFT HEART CATHETERIZATION WITH CORONARY ANGIOGRAM;  Surgeon: Sherren Mocha, MD;  Location: [facility] CATH LAB;  Service: Cardiovascular;  Laterality: N/A;  . PERCUTANEOUS CORONARY INTERVENTION-BALLOON ONLY  08/04/2012   Procedure: PERCUTANEOUS CORONARY INTERVENTION-BALLOON ONLY;  Surgeon: [name], MD;  Location[facility] CATH LAB;  Service: Cardiovascular;;  . PERCUTANEOUS CORONARY ROTOBLATOR INTERVENTION (PCI-R) N/A 08/11/2012   Procedure: PERCUTANEOUS CORONARY ROTOBLATOR INTERVENTION (PCI-R);  Surgeon: [name], MD;  Location: [facility] CATH LAB;  Service: Cardiovascular;  Laterality: N/A;  . PERCUTANEOUS CORONARY STENT INTERVENTION (PCI-S)  10/07/2013   Procedure: PERCUTANEOUS CORONARY STENT INTERVENTION (PCI-S);  Surgeon: [name], MD;  Location: [facility] CATH LAB;  Service: Cardiovascular;;  . PERMANENT PACEMAKER INSERTION N/A 03/16/2012   [name] (SJM) leadless pacemaker (LEADLESS II STUDY PATEINT)  . SALIVARY  GLAND SURGERY  2000's   "had a little lump removed; granulosa related; it was benign" (08/11/2012)  . TEE WITHOUT CARDIOVERSION  12/31/2011   Procedure: TRANSESOPHAGEAL ECHOCARDIOGRAM (TEE);  Surgeon: [name], MD;  Location: [place];  Service: Cardiovascular;  Laterality: N/A;  . VARICOSE VEIN SURGERY Bilateral 1977     Current Outpatient Medications  Medication Sig Dispense Refill  . acetaminophen (TYLENOL) 500 MG tablet Take 500 mg by mouth at bedtime as needed for mild pain.     Marland Kitchen atorvastatin (LIPITOR) 80 MG tablet TAKE 1 TABLET BY MOUTH EVERY MORNING (Patient taking differently: Take 80 mg by mouth every morning. ) 90 tablet 2  . beta carotene w/minerals (OCUVITE) tablet Take 1 tablet by mouth 2 (two) times daily.     . Calcium Carb-Cholecalciferol (CALTRATE 600+D) 600-800 MG-UNIT TABS Take 1 tablet by mouth every evening.     . Coenzyme Q-10 100 MG capsule Take 100-200 mg by mouth daily.     Marland Kitchen diltiazem (CARDIZEM CD) 120 MG 24 hr capsule TAKE ONE CAPSULE BY MOUTH AT BEDTIME (Patient taking differently: Take 120 mg by mouth at bedtime. ) 90 capsule 3  . diltiazem (CARDIZEM CD) 240 MG 24 hr capsule TAKE ONE CAPSULE BY MOUTH DAILY WITH BREAKFAST (Patient taking differently: Take 240 mg by mouth daily. ) 30 capsule 11  . docusate sodium (COLACE) 100 MG capsule Take 100 mg by mouth 2 (two) times daily.    . furosemide (LASIX) 40 MG tablet Take 80 mg by mouth daily.    Marland Kitchen gabapentin (NEURONTIN) 300 MG capsule Take 1 capsule (300 mg total) by mouth 2 (two) times daily. 180 capsule 2  . isosorbide mononitrate (IMDUR) 60 MG 24 hr tablet TAKE 1 TABLET BY MOUTH DAILY (Patient taking differently: Take 60 mg by mouth daily. ) 30 tablet 9  . KLOR-CON M20 20 MEQ tablet TAKE 1 TABLET BY MOUTH EVERY DAY (Patient taking differently: Take 20 mEq by mouth at bedtime. ) 90 tablet 3  . lidocaine-prilocaine (EMLA) cream Apply 1 application topically as needed. (Patient taking differently: Apply 1  application topically as needed (port access). ) 30 g 6  . metoprolol tartrate (LOPRESSOR) 100 MG tablet Take 1 tablet (100 mg total) by mouth 2 (two) times daily. 60 tablet 9  . Multiple Vitamin (MULTIVITAMIN) capsule Take 1 capsule by mouth daily.    . nitroGLYCERIN (NITROSTAT) 0.4 MG SL tablet PLACE 1 TABLET (0.4 MG TOTAL) UNDER THE TONGUE EVERY 5 (FIVE) MINUTES AS NEEDED. FOR CHEST PAIN 25 tablet 6  . NON FORMULARY Place 1 each into the nose at bedtime. CPAP setting 3.5    .  Omega-3 Fatty Acids (FISH OIL) 1200 MG CAPS Take 1,200 mg by mouth daily.     . ondansetron (ZOFRAN) 8 MG tablet Take 1 tablet (8 mg total) by mouth every 8 (eight) hours as needed for nausea. 30 tablet 3  . prochlorperazine (COMPAZINE) 10 MG tablet Take 1 tablet (10 mg total) by mouth every 6 (six) hours as needed for nausea or vomiting. 30 tablet 0  . spironolactone (ALDACTONE) 25 MG tablet Take 25 mg by mouth at bedtime as needed (swelling in ankles).     . warfarin (COUMADIN) 2 MG tablet Take 1 tablet daily except 1/2 tablet on Mondays and Fridays or as directed by Coumadin Clinic. 30 tablet 1   No current facility-administered medications for this visit.     Allergies:   Bee venom; Other; Amiodarone; and Prednisone    Social History:  The patient  reports that she quit smoking about 44 years ago. Her smoking use included cigarettes. She has a 32.00 pack-year smoking history. She has never used smokeless tobacco. She reports current alcohol use of about 5.0 standard drinks of alcohol per week. She reports that she does not use drugs.   Family History:  The patient's family history includes Diabetes in her brother and father; Heart attack in her brother and father; Hypertension in her brother and father; Kidney failure in her brother; Stroke in her mother.    ROS:  Please see the history of present illness.   Otherwise, review of systems are positive for tolerating chemotherapy except for neuropathy sx in her legs.    All other systems are reviewed and negative.    PHYSICAL EXAM: VS:  BP 118/60   Pulse 71   Ht 5' (1.524 m)   Wt 77.1 kg   SpO2 94%   BMI 33.20 kg/m  , BMI Body mass index is 33.2 kg/m. GEN: Well nourished, well developed, in no acute distress  HEENT: normal  Neck: no JVD, carotid bruits, or masses Cardiac: RRR; 2/6 systolic murmur, no rubs, or gallops,no edema  Respiratory:  clear to auscultation bilaterally, normal work of breathing GI: soft, nontender, nondistended, + BS MS: no deformity or atrophy  Skin: warm and dry, no rash Neuro:  Strength and sensation are intact Psych: euthymic mood, full affect    Recent Labs: 11/03/2017: B Natriuretic Peptide 373.6 12/11/2017: Magnesium 1.7 01/12/2018: ALT 14; BUN 19; Creatinine, Ser 0.94; Hemoglobin 10.9; Platelets 116; Potassium 3.4; Sodium 144   Lipid Panel    Component Value Date/Time   CHOL 132 10/08/2017 0905   TRIG 68 10/08/2017 0905   HDL 66 10/08/2017 0905   CHOLHDL 2.0 10/08/2017 0905   CHOLHDL 2.8 07/11/2016 0926   VLDL 9 07/11/2016 0926   LDLCALC 52 10/08/2017 0905     Other studies Reviewed: Additional studies/ records that were reviewed today with results demonstrating: labs reviewed.   ASSESSMENT AND PLAN:  1. CAD: No angina.  Continue aggressive secondary prevention.  Trying to walk.  Some days has fatigue so does not feel like going to rehab. She may drop rehab until chemo is finished.  2.    Chronic combined CHF: Appears euvolemic.  3.  Hyperlipidemia: LDL 52. 4.  AFib: Rate controlled. Back on Coumadin. Likely paced rhythm today since it is regular. 5.   Anticoagulated: No bleeding noted.   Hbg 10.9 in 01/12/18. Moderately calcified aortic valve is likely case of murmur.  No stenosis noted on 2019 echo.     Current medicines are reviewed  at length with the patient today.  The patient concerns regarding her medicines were addressed.  The following changes have been made:  No change  Labs/ tests  ordered today include:  No orders of the defined types were placed in this encounter.   Recommend 150 minutes/week of aerobic exercise Low fat, low carb, high fiber diet recommended  Disposition:   FU in 6 months   Signed, Larae Grooms, MD  02/04/2018 2:36 PM    [facility] [address], [place], [place]  [address] Phone: [phone]; Fax: [phone]

## 2018-02-04 ENCOUNTER — Ambulatory Visit (INDEPENDENT_AMBULATORY_CARE_PROVIDER_SITE_OTHER): Payer: Medicare Other | Admitting: Pharmacist

## 2018-02-04 ENCOUNTER — Ambulatory Visit (INDEPENDENT_AMBULATORY_CARE_PROVIDER_SITE_OTHER): Payer: Medicare Other | Admitting: Interventional Cardiology

## 2018-02-04 ENCOUNTER — Encounter: Payer: Self-pay | Admitting: Interventional Cardiology

## 2018-02-04 VITALS — BP 118/60 | HR 71 | Ht 60.0 in | Wt 170.0 lb

## 2018-02-04 DIAGNOSIS — I5042 Chronic combined systolic (congestive) and diastolic (congestive) heart failure: Secondary | ICD-10-CM

## 2018-02-04 DIAGNOSIS — I25118 Atherosclerotic heart disease of native coronary artery with other forms of angina pectoris: Secondary | ICD-10-CM

## 2018-02-04 DIAGNOSIS — F4323 Adjustment disorder with mixed anxiety and depressed mood: Secondary | ICD-10-CM | POA: Diagnosis not present

## 2018-02-04 DIAGNOSIS — I4891 Unspecified atrial fibrillation: Secondary | ICD-10-CM | POA: Diagnosis not present

## 2018-02-04 DIAGNOSIS — Z5181 Encounter for therapeutic drug level monitoring: Secondary | ICD-10-CM

## 2018-02-04 DIAGNOSIS — Z7901 Long term (current) use of anticoagulants: Secondary | ICD-10-CM

## 2018-02-04 DIAGNOSIS — E782 Mixed hyperlipidemia: Secondary | ICD-10-CM | POA: Diagnosis not present

## 2018-02-04 DIAGNOSIS — I4821 Permanent atrial fibrillation: Secondary | ICD-10-CM | POA: Diagnosis not present

## 2018-02-04 LAB — POCT INR: INR: 2.4 (ref 2.0–3.0)

## 2018-02-04 NOTE — Patient Instructions (Signed)

## 2018-02-04 NOTE — Patient Instructions (Signed)
Continue taking 2 mg daily except 1 mg on Mondays and Fridays. Recheck INR in 2 week. Main [phone]. Coumadin Clinic # [phone]

## 2018-02-05 ENCOUNTER — Inpatient Hospital Stay (HOSPITAL_BASED_OUTPATIENT_CLINIC_OR_DEPARTMENT_OTHER): Payer: Medicare Other | Admitting: Hematology and Oncology

## 2018-02-05 ENCOUNTER — Inpatient Hospital Stay: Payer: Medicare Other

## 2018-02-05 ENCOUNTER — Telehealth: Payer: Self-pay | Admitting: Hematology and Oncology

## 2018-02-05 ENCOUNTER — Encounter: Payer: Self-pay | Admitting: Hematology and Oncology

## 2018-02-05 ENCOUNTER — Inpatient Hospital Stay: Payer: Medicare Other | Attending: Hematology and Oncology

## 2018-02-05 DIAGNOSIS — Z9221 Personal history of antineoplastic chemotherapy: Secondary | ICD-10-CM | POA: Diagnosis not present

## 2018-02-05 DIAGNOSIS — E440 Moderate protein-calorie malnutrition: Secondary | ICD-10-CM | POA: Diagnosis not present

## 2018-02-05 DIAGNOSIS — Z79899 Other long term (current) drug therapy: Secondary | ICD-10-CM | POA: Diagnosis not present

## 2018-02-05 DIAGNOSIS — D61818 Other pancytopenia: Secondary | ICD-10-CM

## 2018-02-05 DIAGNOSIS — I5040 Unspecified combined systolic (congestive) and diastolic (congestive) heart failure: Secondary | ICD-10-CM | POA: Insufficient documentation

## 2018-02-05 DIAGNOSIS — G620 Drug-induced polyneuropathy: Secondary | ICD-10-CM | POA: Diagnosis not present

## 2018-02-05 DIAGNOSIS — D3910 Neoplasm of uncertain behavior of unspecified ovary: Secondary | ICD-10-CM

## 2018-02-05 DIAGNOSIS — C569 Malignant neoplasm of unspecified ovary: Secondary | ICD-10-CM

## 2018-02-05 DIAGNOSIS — Z5111 Encounter for antineoplastic chemotherapy: Secondary | ICD-10-CM | POA: Diagnosis not present

## 2018-02-05 DIAGNOSIS — T451X5A Adverse effect of antineoplastic and immunosuppressive drugs, initial encounter: Secondary | ICD-10-CM | POA: Diagnosis not present

## 2018-02-05 LAB — CBC WITH DIFFERENTIAL/PLATELET
Abs Immature Granulocytes: 0.01 10*3/uL (ref 0.00–0.07)
Basophils Absolute: 0 10*3/uL (ref 0.0–0.1)
Basophils Relative: 1 %
Eosinophils Absolute: 0.1 10*3/uL (ref 0.0–0.5)
Eosinophils Relative: 2 %
HCT: 34.8 % — ABNORMAL LOW (ref 36.0–46.0)
Hemoglobin: 11.2 g/dL — ABNORMAL LOW (ref 12.0–15.0)
Immature Granulocytes: 0 %
Lymphocytes Relative: 17 %
Lymphs Abs: 0.9 10*3/uL (ref 0.7–4.0)
MCH: 34.6 pg — ABNORMAL HIGH (ref 26.0–34.0)
MCHC: 32.2 g/dL (ref 30.0–36.0)
MCV: 107.4 fL — ABNORMAL HIGH (ref 80.0–100.0)
Monocytes Absolute: 0.8 10*3/uL (ref 0.1–1.0)
Monocytes Relative: 15 %
NRBC: 0 % (ref 0.0–0.2)
Neutro Abs: 3.6 10*3/uL (ref 1.7–7.7)
Neutrophils Relative %: 65 %
Platelets: 98 10*3/uL — ABNORMAL LOW (ref 150–400)
RBC: 3.24 MIL/uL — ABNORMAL LOW (ref 3.87–5.11)
RDW: 18 % — ABNORMAL HIGH (ref 11.5–15.5)
WBC: 5.4 10*3/uL (ref 4.0–10.5)

## 2018-02-05 LAB — COMPREHENSIVE METABOLIC PANEL
ALT: 27 U/L (ref 0–44)
ANION GAP: 8 (ref 5–15)
AST: 30 U/L (ref 15–41)
Albumin: 3.3 g/dL — ABNORMAL LOW (ref 3.5–5.0)
Alkaline Phosphatase: 132 U/L — ABNORMAL HIGH (ref 38–126)
BUN: 19 mg/dL (ref 8–23)
CO2: 31 mmol/L (ref 22–32)
Calcium: 9.2 mg/dL (ref 8.9–10.3)
Chloride: 103 mmol/L (ref 98–111)
Creatinine, Ser: 0.94 mg/dL (ref 0.44–1.00)
GFR calc Af Amer: >60 mL/min (ref >60)
GFR calc non Af Amer: 55 mL/min — ABNORMAL LOW (ref >60)
Glucose, Bld: 100 mg/dL — ABNORMAL HIGH (ref 70–99)
POTASSIUM: 3.6 mmol/L (ref 3.5–5.1)
Sodium: 142 mmol/L (ref 135–145)
Total Bilirubin: 0.9 mg/dL (ref 0.3–1.2)
Total Protein: 6.4 g/dL — ABNORMAL LOW (ref 6.5–8.1)

## 2018-02-05 MED ORDER — HEPARIN SOD (PORK) LOCK FLUSH 100 UNIT/ML IV SOLN
500.0000 [IU] | Freq: Once | INTRAVENOUS | Status: AC | PRN
  Administered 2018-02-05: 500 [IU]
  Filled 2018-02-05: qty 5

## 2018-02-05 MED ORDER — SODIUM CHLORIDE 0.9 % IV SOLN
310.0000 mg | Freq: Once | INTRAVENOUS | Status: AC
  Administered 2018-02-05: 310 mg via INTRAVENOUS
  Filled 2018-02-05: qty 31

## 2018-02-05 MED ORDER — SODIUM CHLORIDE 0.9 % IV SOLN
Freq: Once | INTRAVENOUS | Status: AC
  Administered 2018-02-05: 10:00:00 via INTRAVENOUS
  Filled 2018-02-05: qty 250

## 2018-02-05 MED ORDER — PALONOSETRON HCL INJECTION 0.25 MG/5ML
0.2500 mg | Freq: Once | INTRAVENOUS | Status: AC
  Administered 2018-02-05: 0.25 mg via INTRAVENOUS

## 2018-02-05 MED ORDER — SODIUM CHLORIDE 0.9% FLUSH
10.0000 mL | Freq: Once | INTRAVENOUS | Status: AC
  Administered 2018-02-05: 10 mL
  Filled 2018-02-05: qty 10

## 2018-02-05 MED ORDER — SODIUM CHLORIDE 0.9% FLUSH
10.0000 mL | INTRAVENOUS | Status: DC | PRN
  Administered 2018-02-05: 10 mL
  Filled 2018-02-05: qty 10

## 2018-02-05 MED ORDER — PALONOSETRON HCL INJECTION 0.25 MG/5ML
INTRAVENOUS | Status: AC
  Filled 2018-02-05: qty 5

## 2018-02-05 MED ORDER — SODIUM CHLORIDE 0.9 % IV SOLN
Freq: Once | INTRAVENOUS | Status: AC
  Administered 2018-02-05: 11:00:00 via INTRAVENOUS
  Filled 2018-02-05: qty 5

## 2018-02-05 NOTE — Patient Instructions (Signed)
[place] Discharge Instructions for Patients Receiving Chemotherapy  Today you received the following chemotherapy agents: Carboplatin (Paraplatin)  To help prevent nausea and vomiting after your treatment, we encourage you to take your nausea medication as directed. Received Aloxi during treatment today-->Take Compazine (not Zofran) for the next 3 days as needed.    If you develop nausea and vomiting that is not controlled by your nausea medication, call the clinic.   BELOW ARE SYMPTOMS THAT SHOULD BE REPORTED IMMEDIATELY:  *FEVER GREATER THAN 100.5 F  *CHILLS WITH OR WITHOUT FEVER  NAUSEA AND VOMITING THAT IS NOT CONTROLLED WITH YOUR NAUSEA MEDICATION  *UNUSUAL SHORTNESS OF BREATH  *UNUSUAL BRUISING OR BLEEDING  TENDERNESS IN MOUTH AND THROAT WITH OR WITHOUT PRESENCE OF ULCERS  *URINARY PROBLEMS  *BOWEL PROBLEMS  UNUSUAL RASH Items with * indicate a potential emergency and should be followed up as soon as possible.  Feel free to call the clinic should you have any questions or concerns. The clinic phone number is [phone].  Please show the [place] at check-in to the Emergency Department and triage nurse.  Managing Low Blood Counts During Cancer Treatment Cancer treatments such as chemotherapy and radiation can sometimes cause a drop in the supply of blood cells in the body, including red blood cells, white blood cells, and platelets. These blood cells are produced in the body and are released into the blood to perform specific functions:  Red blood cells carry gases such as oxygen and carbon dioxide to and from your lungs.  White blood cells help protect you from infection.  Platelets help your body to form blood clots to prevent and control bleeding. When cancer treatments cause a drop in blood cell counts, your body may not have enough cells to keep up its normal functions. Symptoms or problems that may result will vary depending on which  type of blood cells the treatment is affecting. If your blood counts are low, you can take steps to help manage any problems. How can low blood counts affect me? Low blood counts have various effects depending on the type of blood cells involved:  If you have a low number of red blood cells, you have a condition called anemia. This can cause symptoms such as: ? Feeling tired and weak. ? Feeling light-headed. ? Being short of breath.  If you have a low number of white blood cells, you may be at higher risk for infections.  If you have a low number of platelets, you may bleed more easily, or your body may have trouble stopping any bleeding. You may also have more bruising. How to manage symptoms or prevent problems from a low blood count If you have a low blood count, you can take steps to manage symptoms or prevent problems that may develop. The steps to take will depend on which type of blood cell is low. Low red blood cells Take these steps to help manage the symptoms of anemia:  Go for a walk or do some light exercise each day.  Take short naps during the day.  Eat foods that contain a lot of iron and protein. These include leafy vegetables, meat and fish, beans, sweet potatoes, and dried fruit such as prunes, raisins, and apricots.  Ask for help with errands and with work that needs to be done around the house. It is important to save your energy.  Take vitamins or supplements-such as iron, vitamin B28, or folic acid-as told by your  health care provider.  Practice relaxation techniques, such as yoga or meditation. Low white blood cells Take these steps to help prevent infections:  Wash your hands often with warm, soapy water.  Avoid crowds of people and any person who has the flu or a fever.  Take care when cleaning yourself after using the bathroom. Tell your health care provider if you have any rectal sores or bleeding.  Avoid dental work. Check your mouth each day for sores  or signs of infection.  Do not share utensils.  Avoid contact with pet waste. Wash your hands after handling pets.  If you get a scrape or cut, clean it thoroughly right away.  Avoid fresh plants or dried flowers.  Do not swim or wade in lakes, ponds, rivers, water parks, or hot tubs.  Follow food safety guidelines. Cook meat thoroughly and wash all raw fruits and vegetables.  You may be instructed to wear a mask when around others to protect yourself.  Low platelets Take these steps to help prevent or control bleeding and bruising:  Use an electric razor for shaving instead of a blade.  Use a soft toothbrush and be careful during oral care. Talk with your cancer care team about whether you should avoid flossing. If your mouth is bleeding, rinse it with ice water.  Avoid activities that could cause injury, such as contact sports.  Talk with your health care provider about using laxatives or stool softeners to avoid constipation.  Do not use medicines such as ibuprofen, aspirin, or naproxen unless your health care provider tells you to.  Limit alcohol use.  Monitor any bleeding closely. If you start bleeding, hold pressure on the area for 5 minutes to stop the bleeding. Bleeding that does not stop is considered an emergency. What treatments can help increase a low blood count? If needed, your health care provider may recommend treatment for a low blood count. Treatment will depend on the type of blood cell that is low and the severity of your condition. Treatment options may include:  Taking medicines to help stimulate the growth of blood cells. This is an option for treating a low red blood cell count. Your health care provider may also recommend that you take iron, folic acid, or vitamin B12 supplements.  Making dietary changes. Including more iron and protein in your diet can help stimulate the growth of red blood cells.  Adjusting your current medicines to help raise blood  counts.  Making changes to your treatment plan.  Having a blood transfusion. This may be done if your blood count is very low. Contact a health care provider if:  You feel extremely tired and weak.  You have more bruising or bleeding.  You feel ill or you develop a cough.  You have swelling or redness.  You have mouth sores or a sore throat.  You have painful urination or you have blood in your urine or stool.  You are thinking of taking any new supplements or vitamins or making dietary changes. Get help right away if:  You are short of breath, have chest pain, or feel dizzy.  You have a fever or chills.  You have abdominal pain or diarrhea.  You have bleeding that will not stop. Summary  Cancer treatments such as chemotherapy and radiation can sometimes cause a drop in the supply of blood cells in the body, including red blood cells, white blood cells, and platelets.  If you have a low blood count, you can  take steps to manage symptoms or prevent problems that may develop.  Depending on which type of blood cell is low, you may need to take steps to prevent infection, prevent bleeding, or manage symptoms that may develop.  If needed, your health care provider may recommend treatment for a low blood count. This information is not intended to replace advice given to you by your health care provider. Make sure you discuss any questions you have with your health care provider. Document Released: 09/15/2015 Document Revised: 09/15/2015 Document Reviewed: 09/15/2015 Elsevier Interactive Patient Education  [employer].

## 2018-02-05 NOTE — Assessment & Plan Note (Signed)
I recommend minor dose adjustment to treatment We will monitor carefully For now, she does not need further work-up, transfusion support or G-CSF

## 2018-02-05 NOTE — Assessment & Plan Note (Signed)
She will continue medical management She has no clinical signs or symptoms of congestive heart failure Previously noted hemorrhage/hematoma has resolved

## 2018-02-05 NOTE — Telephone Encounter (Signed)
Gave avs and calendar ° °

## 2018-02-05 NOTE — Assessment & Plan Note (Signed)
She has mild peripheral neuropathy related to treatment.  Neuropathy has not worsened.  Continue gabapentin.

## 2018-02-05 NOTE — Assessment & Plan Note (Signed)
She tolerated treatment well without major side effects except for mild occasional nausea, fatigue and pancytopenia We will proceed with treatment today despite mild pancytopenia With the next treatment, I recommend delaying a week in anticipation for worsening pancytopenia After she completes cycle 6 of treatment, I recommend repeat imaging study before she sees her GYN oncologist for further follow-up.  She agreed with the plan of care

## 2018-02-05 NOTE — Progress Notes (Signed)
Per Dr. Alvy Bimler: OK to treat with platelets of 98

## 2018-02-05 NOTE — Progress Notes (Signed)
[facility] OFFICE PROGRESS NOTE  Patient Care Team: [name], [name], [name], [name], [name], MD as Consulting Physician (Cardiology)  ASSESSMENT & PLAN:  Granulosa cell tumor of ovary She tolerated treatment well without major side effects except for mild occasional nausea, fatigue and pancytopenia We will proceed with treatment today despite mild pancytopenia With the next treatment, I recommend delaying a week in anticipation for worsening pancytopenia After she completes cycle 6 of treatment, I recommend repeat imaging study before she sees her GYN oncologist for further follow-up.  She agreed with the plan of care  Pancytopenia, acquired ([place]) I recommend minor dose adjustment to treatment We will monitor carefully For now, she does not need further work-up, transfusion support or G-CSF  Peripheral neuropathy due to chemotherapy [facility]) She has mild peripheral neuropathy related to treatment.  Neuropathy has not worsened.  Continue gabapentin.  Combined systolic and diastolic heart failure, NYHA class 3 ([place]) She will continue medical management She has no clinical signs or symptoms of congestive heart failure Previously noted hemorrhage/hematoma has resolved   No orders of the defined types were placed in this encounter.   INTERVAL HISTORY: Please see below for problem oriented charting. She is seen prior to cycle 5 of chemotherapy She tolerated last cycle of treatment well She has some mild nausea, fatigue for few days but otherwise feels good.  No recent infection, fever or chills No recent bleeding No recent exacerbation of congestive heart failure. Denies worsening peripheral neuropathy.  SUMMARY OF ONCOLOGIC HISTORY:   Granulosa cell tumor of  ovary   02/19/1998 Initial Diagnosis    The granulosa cell tumor was initially diagnosed at [facility] in 2000, with recurrences in 2004, 2007 and spring 2011. The original operative note from 2000 and that pathology did not transfer into present EMR and I do not know laterality of the original tumor. The surgery in 2004 included resection of the tumor with small bowel resection and colectomy for diverticulosis. She had laparoscopic repair of ventral hernia in 2005. In 2007 she had resection of pelvic recurrence of tumor plus open repair of ventral hernia with mesh. Most recent intervention was laparoscopic resection of suprapubic involvement by Dr.Hoxworth in May 2011; she had a small and asymptomatic suprapubic hernia in 2011 which has been followed. All of her disease has been very indolent and has generally been detected by CT scans. Last CT AP was April 2013; [name], self-limited episode of bowel obstruction in May 2012.    08/24/2002 Pathology Results    SOFT TISSUE MASS, [name], [name], there are tumor cells present which appear histologically similar to those noted in the patient's previous granulosa cell tumor ([id number]) and therefore, consistent with recurrent/metastatic granulosa cell tumor. The findings correlate with the cytology material    12/06/2002 Pathology Results    1. SMALL INTESTINE, [name], SIGMOID, SEGMENTAL RESECTION: DIVERTICULOSIS WITH FOCAL MILD CHRONIC DIVERTICULITIS. SIX BENIGN LYMPH NODES. NO TUMOR IDENTIFIED.    05/12/2005 PET scan    Again identified are enlarged lymph nodes within the right  posterior cervical region and left external iliac region, as well as a large  omental or peritoneal deposit just below the right rectus sheath.  All of these exhibit very low-level FDG uptake which likely reflects the hypometabolic state of this tumor.  Clearly these masses have increased over time, especially when compared with the examination dated 10/30/04.    10/01/2005 Pathology Results    1. ABDOMINAL WALL MASS: METASTATIC GRANULOSA CELL TUMOR.  2. LEFT ILIAC MASS: METASTATIC GRANULOSA CELL TUMOR.  COMMENT Both specimens are involved by metastatic tumor with features consistent with metastasis from the previously diagnosed adult granulosa cell tumor.     06/11/2007 Imaging    Stable CT the pelvis - no evidence of metastatic disease.    06/09/2008 Imaging    1.  Interval enlargement of small left perivesical nodule measuring 18 mm maximally.  This may reflect a lymph node but has a nonspecific appearance.  Attention to this area followup is recommended. 2.  Otherwise stable postsurgical appearance of the pelvis.    12/14/2008 Imaging    1.  Mild increase in size of low attenuation nodule in the left anterior pelvis indenting the urinary bladder, which now measures 2.8 cm compared with 1.8 cm on prior study.  Metastatic disease cannot be excluded. 2.  No other significant changes identified.  Stable diverticulosis and small suprapubic anterior abdominal wall hernia containing a loop of small bowel.    05/23/2009 Pathology Results    1. SOFT TISSUE MASS, SIMPLE EXCISION, : INVOLVEMENT BY NEOPLASM CONSISTENT WITH METASTATIC GRANULOSA CELL TUMOR.    06/08/2010 Imaging    1.  No acute findings. 2.  Interval progression of mild diffuse left renal atrophy and hypoperfusion, consistent with left renal artery stenosis. 3.  Stable small hiatal hernia, tiny periumbilical hernia, and small suprapubic hernia containing a loop of small bowel.  No evidence of bowel ischemia or obstruction. 4.  Diverticulosis.  No radiographic evidence of diverticulitis.    05/22/2011 Imaging     1.  Stable exam.  No new or progressive disease identified within the abdomen or pelvis. 2.  Stable small hiatal hernia and suprapubic ventral hernia. Stable left renal atrophy, likely secondary to renal artery stenosis.    11/28/2011 Imaging    1.  No findings to suggest recurrent or metastatic disease in the abdomen or pelvis on today's examination. 2.  Indeterminate liver lesion in the right lobe of the liver is unchanged compared to numerous remote prior examinations. 3. Small suprapubic ventral hernia again noted. 4.  Extensive atherosclerosis including at least three vessel coronary artery disease.  5.  Postoperative changes, as above.     12/01/2016 Tumor Marker    Patient's tumor was tested for the following markers: Inhibin B Results of the tumor marker test revealed 119.2    12/11/2016 Imaging    Multiple new soft tissue masses in left pelvis and central small bowel mesentery, consistent with recurrent/metastatic carcinoma.     03/25/2017 Imaging    1. Omental and mesenteric tumor implants throughout the abdomen and pelvis with mixed findings when compared the prior study. Some of these lesions are clearly smaller and others are clearly larger. No new lesions are identified. 2. Stable severe atherosclerotic calcifications. 3. Stable low anterior abdominal wall hernia. 4. Stable low-attenuation lesion involving the right hepatic lobe with adjacent calcifications or surgical clips.    10/12/2017 Imaging    Extraperitoneal fluid/hemorrhage related to the patient's dominant left pelvic tumor, as described above.  Progression  of the patient's metastatic ovarian cancer, as described above. Dominant cystic/solid mass in the left adnexa/lower pelvis has increased in size. Peritoneal disease/omental caking is mildly progressive. New pulmonary nodule at the right lung base, indeterminate.     10/12/2017 - 10/17/2017 Hospital Admission    She was admitted for retroperitoneal bleed.     10/16/2017 Imaging    1. Improving peritoneal hemorrhage. 2. No significant change in cystic and solid left pelvic mass and smaller anterior peritoneal cystic and solid mass compatible with tumor implant. 3. No evidence of metastatic disease elsewhere in the abdomen or pelvis. 4. Cardiomegaly. 5.  Calcific coronary artery and aortic atherosclerosis.  Aortic Atherosclerosis (ICD10-I70.0).    10/27/2017 Tumor Marker    Patient's tumor was tested for the following markers: Inhibin B Results of the tumor marker test revealed 98.4    10/28/2017 Procedure    Status post right IJ port catheter placement.    10/29/2017 -  Chemotherapy    The patient had carboplatin    11/24/2017 Tumor Marker    Patient's tumor was tested for the following markers: Inhibin B Results of the tumor marker test revealed 113.6    01/04/2018 Imaging    1. The left adnexal mass appears mildly decreased in size from previous exam. Associated hemorrhage with left lower abdominal hematomas have resolved in the interval. Low-density central mesenteric lesion is also slightly decreased in size in the interval. The solid and cystic lesion along the undersurface of the ventral abdominal wall is mildly increased in size in the interval. Other peritoneal nodules are either stable or decreased in the interval. 2. Right lower lobe pulmonary nodule is unchanged. 3. No new sites of disease identified. 4. Aortic Atherosclerosis (ICD10-I70.0).     Ovarian cancer ([place])   10/19/2017 Initial Diagnosis    Ovarian cancer ([place])     REVIEW OF SYSTEMS:   Constitutional: Denies fevers, chills or abnormal weight loss Eyes: Denies blurriness of vision Ears, nose, mouth, throat, and face: Denies mucositis or sore throat Respiratory: Denies cough, dyspnea or wheezes Cardiovascular: Denies palpitation, chest discomfort or lower extremity swelling Gastrointestinal:  Denies nausea, heartburn or change in bowel habits Skin: Denies abnormal  skin rashes Lymphatics: Denies new lymphadenopathy or easy bruising Neurological:Denies numbness, tingling or new weaknesses Behavioral/Psych: Mood is stable, no new changes  All other systems were reviewed with the patient and are negative.  I have reviewed the past medical history, past surgical history, social history and family history with the patient and they are unchanged from previous note.  ALLERGIES:  is allergic to bee venom; other; amiodarone; and prednisone.  MEDICATIONS:  Current Outpatient Medications  Medication Sig Dispense Refill  . acetaminophen (TYLENOL) 500 MG tablet Take 500 mg by mouth at bedtime as needed for mild pain.     Marland Kitchen atorvastatin (LIPITOR) 80 MG tablet TAKE 1 TABLET BY MOUTH EVERY MORNING (Patient taking differently: Take 80 mg by mouth every morning. ) 90 tablet 2  . beta carotene w/minerals (OCUVITE) tablet Take 1 tablet by mouth 2 (two) times daily.     . Calcium Carb-Cholecalciferol (CALTRATE 600+D) 600-800 MG-UNIT TABS Take 1 tablet by mouth every evening.     . Coenzyme Q-10 100 MG capsule Take 100-200 mg by mouth daily.     Marland Kitchen diltiazem (CARDIZEM CD) 120 MG 24 hr capsule TAKE ONE CAPSULE BY MOUTH AT BEDTIME (Patient taking differently: Take 120 mg by mouth at bedtime. ) 90 capsule 3  . diltiazem (CARDIZEM CD) 240  MG 24 hr capsule TAKE ONE CAPSULE BY MOUTH DAILY WITH BREAKFAST (Patient taking differently: Take 240 mg by mouth daily. ) 30 capsule 11  . docusate sodium (COLACE) 100 MG capsule Take 100 mg by mouth 2 (two) times daily.    . furosemide (LASIX) 40 MG tablet Take 80 mg by mouth daily.    Marland Kitchen gabapentin (NEURONTIN) 300 MG capsule Take 1 capsule (300 mg total) by mouth 2 (two) times daily. 180 capsule 2  . isosorbide mononitrate (IMDUR) 60 MG 24 hr tablet TAKE 1 TABLET BY MOUTH DAILY (Patient taking differently: Take 60 mg by mouth daily. ) 30 tablet 9  . KLOR-CON M20 20 MEQ tablet TAKE 1 TABLET BY MOUTH EVERY DAY (Patient taking differently: Take  20 mEq by mouth at bedtime. ) 90 tablet 3  . lidocaine-prilocaine (EMLA) cream Apply 1 application topically as needed. (Patient taking differently: Apply 1 application topically as needed (port access). ) 30 g 6  . metoprolol tartrate (LOPRESSOR) 100 MG tablet Take 1 tablet (100 mg total) by mouth 2 (two) times daily. 60 tablet 9  . Multiple Vitamin (MULTIVITAMIN) capsule Take 1 capsule by mouth daily.    . nitroGLYCERIN (NITROSTAT) 0.4 MG SL tablet PLACE 1 TABLET (0.4 MG TOTAL) UNDER THE TONGUE EVERY 5 (FIVE) MINUTES AS NEEDED. FOR CHEST PAIN 25 tablet 6  . NON FORMULARY Place 1 each into the nose at bedtime. CPAP setting 3.5    . Omega-3 Fatty Acids (FISH OIL) 1200 MG CAPS Take 1,200 mg by mouth daily.     . ondansetron (ZOFRAN) 8 MG tablet Take 1 tablet (8 mg total) by mouth every 8 (eight) hours as needed for nausea. 30 tablet 3  . prochlorperazine (COMPAZINE) 10 MG tablet Take 1 tablet (10 mg total) by mouth every 6 (six) hours as needed for nausea or vomiting. 30 tablet 0  . spironolactone (ALDACTONE) 25 MG tablet Take 25 mg by mouth at bedtime as needed (swelling in ankles).     . warfarin (COUMADIN) 2 MG tablet Take 1 tablet daily except 1/2 tablet on Mondays and Fridays or as directed by Coumadin Clinic. 30 tablet 1   No current facility-administered medications for this visit.    Facility-Administered Medications Ordered in Other Visits  Medication Dose Route Frequency Provider Last Rate Last Dose  . CARBOplatin (PARAPLATIN) 310 mg in sodium chloride 0.9 % 250 mL chemo infusion  310 mg Intravenous Once Alvy Bimler, Saniyah Mondesir, MD 562 mL/hr at 02/05/18 1154 310 mg at 02/05/18 1154  . heparin lock flush 100 unit/mL  500 Units Intracatheter Once PRN Alvy Bimler, Estaban [place], MD      . sodium chloride flush (NS) 0.9 % injection 10 mL  10 mL Intracatheter PRN Alvy Bimler, Damien Cisar, MD        PHYSICAL EXAMINATION: ECOG PERFORMANCE STATUS: 1 - Symptomatic but completely ambulatory  Vitals:   02/05/18 0934  BP: (!) 149/60   Pulse: 69  Resp: 18  Temp: 97.8 F (36.6 C)  SpO2: 94%   Filed Weights   02/05/18 0934  Weight: 171 lb 12.8 oz (77.9 kg)    GENERAL:alert, no distress and comfortable SKIN: skin color, texture, turgor are normal, no rashes or significant lesions EYES: normal, Conjunctiva are pink and non-injected, sclera clear OROPHARYNX:no exudate, no erythema and lips, buccal mucosa, and tongue normal  NECK: supple, thyroid normal size, non-tender, without nodularity LYMPH:  no palpable lymphadenopathy in the cervical, axillary or inguinal LUNGS: clear to auscultation and percussion with normal breathing effort  HEART: regular rate & rhythm and no murmurs with mild lower extremity edema ABDOMEN:abdomen soft, non-tender and normal bowel sounds Musculoskeletal:no cyanosis of digits and no clubbing  NEURO: alert & oriented x 3 with fluent speech, no focal motor/sensory deficits  LABORATORY DATA:  I have reviewed the data as listed    Component Value Date/Time   NA 142 02/05/2018 0915   NA 142 12/14/2014 1324   K 3.6 02/05/2018 0915   K 4.2 12/14/2014 1324   CL 103 02/05/2018 0915   CL 105 05/26/2012 1111   CO2 31 02/05/2018 0915   CO2 30 (H) 12/14/2014 1324   GLUCOSE 100 (H) 02/05/2018 0915   GLUCOSE 130 12/14/2014 1324   GLUCOSE 89 05/26/2012 1111   BUN 19 02/05/2018 0915   BUN 19.6 12/01/2016 1116   CREATININE 0.94 02/05/2018 0915   CREATININE 0.86 12/17/2017 0828   CREATININE 0.9 12/01/2016 1116   CALCIUM 9.2 02/05/2018 0915   CALCIUM 9.1 12/14/2014 1324   PROT 6.4 (L) 02/05/2018 0915   PROT 6.5 10/08/2017 0905   PROT 6.5 12/14/2014 1324   ALBUMIN 3.3 (L) 02/05/2018 0915   ALBUMIN 3.9 10/08/2017 0905   ALBUMIN 3.4 (L) 12/14/2014 1324   AST 30 02/05/2018 0915   AST 25 12/17/2017 0828   AST 29 12/14/2014 1324   ALT 27 02/05/2018 0915   ALT 26 12/17/2017 0828   ALT 20 12/14/2014 1324   ALKPHOS 132 (H) 02/05/2018 0915   ALKPHOS 103 12/14/2014 1324   BILITOT 0.9 02/05/2018  0915   BILITOT 0.5 12/17/2017 0828   BILITOT 0.77 12/14/2014 1324   GFRNONAA 55 (L) 02/05/2018 0915   GFRNONAA 60 (L) 12/17/2017 0828   GFRAA >60 02/05/2018 0915   GFRAA >60 12/17/2017 0828    No results found for: SPEP, UPEP  Lab Results  Component Value Date   WBC 5.4 02/05/2018   NEUTROABS 3.6 02/05/2018   HGB 11.2 (L) 02/05/2018   HCT 34.8 (L) 02/05/2018   MCV 107.4 (H) 02/05/2018   PLT 98 (L) 02/05/2018      Chemistry      Component Value Date/Time   NA 142 02/05/2018 0915   NA 142 12/14/2014 1324   K 3.6 02/05/2018 0915   K 4.2 12/14/2014 1324   CL 103 02/05/2018 0915   CL 105 05/26/2012 1111   CO2 31 02/05/2018 0915   CO2 30 (H) 12/14/2014 1324   BUN 19 02/05/2018 0915   BUN 19.6 12/01/2016 1116   CREATININE 0.94 02/05/2018 0915   CREATININE 0.86 12/17/2017 0828   CREATININE 0.9 12/01/2016 1116      Component Value Date/Time   CALCIUM 9.2 02/05/2018 0915   CALCIUM 9.1 12/14/2014 1324   ALKPHOS 132 (H) 02/05/2018 0915   ALKPHOS 103 12/14/2014 1324   AST 30 02/05/2018 0915   AST 25 12/17/2017 0828   AST 29 12/14/2014 1324   ALT 27 02/05/2018 0915   ALT 26 12/17/2017 0828   ALT 20 12/14/2014 1324   BILITOT 0.9 02/05/2018 0915   BILITOT 0.5 12/17/2017 0828   BILITOT 0.77 12/14/2014 1324      All questions were answered. The patient knows to call the clinic with any problems, questions or concerns. No barriers to learning was detected.  I spent 15 minutes counseling the patient face to face. The total time spent in the appointment was 20 minutes and more than 50% was on counseling and review of test results  Heath Lark, MD 02/05/2018 12:05 PM

## 2018-02-07 ENCOUNTER — Other Ambulatory Visit: Payer: Self-pay | Admitting: Interventional Cardiology

## 2018-02-08 ENCOUNTER — Ambulatory Visit: Payer: Medicare Other | Admitting: Gynecologic Oncology

## 2018-02-08 ENCOUNTER — Ambulatory Visit (INDEPENDENT_AMBULATORY_CARE_PROVIDER_SITE_OTHER): Payer: Medicare Other

## 2018-02-08 DIAGNOSIS — I495 Sick sinus syndrome: Secondary | ICD-10-CM | POA: Diagnosis not present

## 2018-02-09 LAB — CUP PACEART REMOTE DEVICE CHECK
Battery Remaining Longevity: 123 mo
Battery Remaining Percentage: 95.5 %
Battery Voltage: 3.01 V
Brady Statistic AP VP Percent: 2 %
Brady Statistic AS VP Percent: 64 %
Brady Statistic AS VS Percent: 4 %
Brady Statistic RA Percent Paced: 21 %
Brady Statistic RV Percent Paced: 55 %
Date Time Interrogation Session: 20200106101428
Implantable Lead Implant Date: 20181224
Implantable Lead Implant Date: 20181224
Implantable Lead Location: 753859
Implantable Pulse Generator Implant Date: 20181224
Lead Channel Impedance Value: 550 Ohm
Lead Channel Impedance Value: 730 Ohm
Lead Channel Pacing Threshold Amplitude: 0.75 V
Lead Channel Pacing Threshold Pulse Width: 0.4 ms
Lead Channel Pacing Threshold Pulse Width: 0.4 ms
Lead Channel Sensing Intrinsic Amplitude: 5 mV
Lead Channel Setting Pacing Amplitude: 2 V
Lead Channel Setting Pacing Amplitude: 2.5 V
Lead Channel Setting Sensing Sensitivity: 2 mV
MDC IDC LEAD LOCATION: 753860
MDC IDC MSMT LEADCHNL RV PACING THRESHOLD AMPLITUDE: 0.75 V
MDC IDC MSMT LEADCHNL RV SENSING INTR AMPL: 5.4 mV
MDC IDC SET LEADCHNL RV PACING PULSEWIDTH: 0.4 ms
MDC IDC STAT BRADY AP VS PERCENT: 29 %
Pulse Gen Serial Number: 8968200

## 2018-02-09 LAB — INHIBIN B: Inhibin B: 172.2 pg/mL — ABNORMAL HIGH (ref 0.0–16.9)

## 2018-02-09 NOTE — Progress Notes (Signed)
Remote pacemaker transmission.   

## 2018-02-11 ENCOUNTER — Other Ambulatory Visit: Payer: Self-pay | Admitting: Interventional Cardiology

## 2018-02-16 ENCOUNTER — Telehealth: Payer: Self-pay | Admitting: Interventional Cardiology

## 2018-02-16 NOTE — Telephone Encounter (Signed)
New Message   Patient would like to speak to you about what her upcoming appt's are for, and should she make her recall appt's.

## 2018-02-16 NOTE — Telephone Encounter (Signed)
Returned call to patient. She is wanting to know if Dr. Irish Lack would like for her to restart her plavix. This was stopped in the past d/t intraabdominal bleeding. Patient has restarted her coumadin and denies having any S/Sx of bleeding. Made patient aware that I will forward to Dr. Irish Lack for review and recommendation.   Patient states that she received a letter in the mail stating that it was time for her to schedule her follow-up appointment. Made patient aware that she is not due for follow-up with Dr. Irish Lack for 6 months as we just saw her. Made patient aware that she will be due for follow-up with Chanetta Marshall, NP in February. Made patient aware that I will send a message to the EP scheduler and she will give her a call to schedule appointment with [facility]. Patient verabalized understanding and thanked me for the call.

## 2018-02-18 ENCOUNTER — Ambulatory Visit (INDEPENDENT_AMBULATORY_CARE_PROVIDER_SITE_OTHER): Payer: Medicare Other

## 2018-02-18 DIAGNOSIS — F4323 Adjustment disorder with mixed anxiety and depressed mood: Secondary | ICD-10-CM | POA: Diagnosis not present

## 2018-02-18 DIAGNOSIS — Z5181 Encounter for therapeutic drug level monitoring: Secondary | ICD-10-CM | POA: Diagnosis not present

## 2018-02-18 DIAGNOSIS — I4891 Unspecified atrial fibrillation: Secondary | ICD-10-CM

## 2018-02-18 LAB — POCT INR: INR: 1.9 — AB (ref 2.0–3.0)

## 2018-02-18 NOTE — Telephone Encounter (Signed)
Left message for patient to call back  

## 2018-02-18 NOTE — Patient Instructions (Signed)
Description   Take 3mg  today, then start taking 2 mg daily except 1 mg on Mondays. Recheck INR in 2 weeks. Main [phone]. Coumadin Clinic # [phone]

## 2018-02-18 NOTE — Telephone Encounter (Signed)
OK to restart Plavix 75 mg daily. Watch for any sign of bleeding .

## 2018-02-19 ENCOUNTER — Other Ambulatory Visit: Payer: Self-pay | Admitting: Interventional Cardiology

## 2018-02-19 MED ORDER — ISOSORBIDE MONONITRATE ER 60 MG PO TB24: 60.0000 mg | ORAL_TABLET | Freq: Every day | ORAL | 3 refills | Status: DC

## 2018-02-19 NOTE — Telephone Encounter (Signed)
Pt's medication was sent to pt's pharmacy as requested. Confirmation received.  °

## 2018-02-19 NOTE — Telephone Encounter (Signed)
New Message        *STAT* If patient is at the pharmacy, call can be transferred to refill team.   1. Which medications need to be refilled? (please list name of each medication and dose if known)  Isosorbide 60  2. Which pharmacy/location (including street and city if local pharmacy) is medication to be sent to? [facility]   3. Do they need a 30 day or 90 day supply? [place]

## 2018-02-23 MED ORDER — CLOPIDOGREL BISULFATE 75 MG PO TABS: 75.0000 mg | ORAL_TABLET | Freq: Every day | ORAL | 3 refills | Status: DC

## 2018-02-23 NOTE — Telephone Encounter (Signed)
Patient was in the office for coumadin check the other day and made patient aware that Dr. Irish Lack was okay with her restarting plavix 75 mg QD. Instructed the patient to watch for S/Sx of bleeding. Patient verbalized understanding.

## 2018-02-25 ENCOUNTER — Other Ambulatory Visit: Payer: Medicare Other

## 2018-02-25 ENCOUNTER — Ambulatory Visit: Payer: Medicare Other

## 2018-02-25 ENCOUNTER — Ambulatory Visit: Payer: Medicare Other | Admitting: Hematology and Oncology

## 2018-03-03 ENCOUNTER — Ambulatory Visit (INDEPENDENT_AMBULATORY_CARE_PROVIDER_SITE_OTHER): Payer: Medicare Other | Admitting: *Deleted

## 2018-03-03 DIAGNOSIS — F4323 Adjustment disorder with mixed anxiety and depressed mood: Secondary | ICD-10-CM | POA: Diagnosis not present

## 2018-03-03 DIAGNOSIS — Z5181 Encounter for therapeutic drug level monitoring: Secondary | ICD-10-CM | POA: Diagnosis not present

## 2018-03-03 DIAGNOSIS — I4891 Unspecified atrial fibrillation: Secondary | ICD-10-CM | POA: Diagnosis not present

## 2018-03-03 LAB — POCT INR: INR: 2 (ref 2.0–3.0)

## 2018-03-03 NOTE — Patient Instructions (Signed)
Description   Take 3mg  today, then continue taking 2 mg daily except 1 mg on Mondays. Recheck INR in 3 weeks. Main [phone]. Coumadin Clinic # [phone]

## 2018-03-04 ENCOUNTER — Inpatient Hospital Stay (HOSPITAL_BASED_OUTPATIENT_CLINIC_OR_DEPARTMENT_OTHER): Payer: Medicare Other | Admitting: Hematology and Oncology

## 2018-03-04 ENCOUNTER — Encounter: Payer: Self-pay | Admitting: Hematology and Oncology

## 2018-03-04 ENCOUNTER — Inpatient Hospital Stay: Payer: Medicare Other

## 2018-03-04 ENCOUNTER — Telehealth: Payer: Self-pay | Admitting: Hematology and Oncology

## 2018-03-04 VITALS — BP 139/62 | HR 80 | Temp 97.7°F | Resp 18 | Ht 60.0 in | Wt 172.0 lb

## 2018-03-04 DIAGNOSIS — G620 Drug-induced polyneuropathy: Secondary | ICD-10-CM

## 2018-03-04 DIAGNOSIS — C569 Malignant neoplasm of unspecified ovary: Secondary | ICD-10-CM | POA: Diagnosis not present

## 2018-03-04 DIAGNOSIS — Z79899 Other long term (current) drug therapy: Secondary | ICD-10-CM | POA: Diagnosis not present

## 2018-03-04 DIAGNOSIS — D3910 Neoplasm of uncertain behavior of unspecified ovary: Secondary | ICD-10-CM

## 2018-03-04 DIAGNOSIS — D61818 Other pancytopenia: Secondary | ICD-10-CM

## 2018-03-04 DIAGNOSIS — I5040 Unspecified combined systolic (congestive) and diastolic (congestive) heart failure: Secondary | ICD-10-CM | POA: Diagnosis not present

## 2018-03-04 DIAGNOSIS — T451X5A Adverse effect of antineoplastic and immunosuppressive drugs, initial encounter: Secondary | ICD-10-CM

## 2018-03-04 DIAGNOSIS — Z5111 Encounter for antineoplastic chemotherapy: Secondary | ICD-10-CM | POA: Diagnosis not present

## 2018-03-04 LAB — CBC WITH DIFFERENTIAL/PLATELET
Abs Immature Granulocytes: 0.01 10*3/uL (ref 0.00–0.07)
Basophils Absolute: 0 10*3/uL (ref 0.0–0.1)
Basophils Relative: 1 %
Eosinophils Absolute: 0.1 10*3/uL (ref 0.0–0.5)
Eosinophils Relative: 2 %
HCT: 34.8 % — ABNORMAL LOW (ref 36.0–46.0)
HEMOGLOBIN: 11.2 g/dL — AB (ref 12.0–15.0)
Immature Granulocytes: 0 %
Lymphocytes Relative: 26 %
Lymphs Abs: 1 10*3/uL (ref 0.7–4.0)
MCH: 35.4 pg — ABNORMAL HIGH (ref 26.0–34.0)
MCHC: 32.2 g/dL (ref 30.0–36.0)
MCV: 110.1 fL — ABNORMAL HIGH (ref 80.0–100.0)
Monocytes Absolute: 0.4 10*3/uL (ref 0.1–1.0)
Monocytes Relative: 11 %
Neutro Abs: 2.4 10*3/uL (ref 1.7–7.7)
Neutrophils Relative %: 60 %
Platelets: 111 10*3/uL — ABNORMAL LOW (ref 150–400)
RBC: 3.16 MIL/uL — ABNORMAL LOW (ref 3.87–5.11)
RDW: 15.8 % — ABNORMAL HIGH (ref 11.5–15.5)
WBC: 4 10*3/uL (ref 4.0–10.5)
nRBC: 0 % (ref 0.0–0.2)

## 2018-03-04 LAB — COMPREHENSIVE METABOLIC PANEL
ALK PHOS: 117 U/L (ref 38–126)
ALT: 18 U/L (ref 0–44)
AST: 28 U/L (ref 15–41)
Albumin: 3.4 g/dL — ABNORMAL LOW (ref 3.5–5.0)
Anion gap: 8 (ref 5–15)
BUN: 20 mg/dL (ref 8–23)
CO2: 32 mmol/L (ref 22–32)
Calcium: 9 mg/dL (ref 8.9–10.3)
Chloride: 103 mmol/L (ref 98–111)
Creatinine, Ser: 0.92 mg/dL (ref 0.44–1.00)
GFR calc Af Amer: >60 mL/min (ref >60)
GFR calc non Af Amer: 57 mL/min — ABNORMAL LOW (ref >60)
Glucose, Bld: 119 mg/dL — ABNORMAL HIGH (ref 70–99)
Potassium: 3.4 mmol/L — ABNORMAL LOW (ref 3.5–5.1)
Sodium: 143 mmol/L (ref 135–145)
Total Bilirubin: 0.7 mg/dL (ref 0.3–1.2)
Total Protein: 6.6 g/dL (ref 6.5–8.1)

## 2018-03-04 MED ORDER — SODIUM CHLORIDE 0.9% FLUSH
10.0000 mL | INTRAVENOUS | Status: DC | PRN
  Administered 2018-03-04: 10 mL
  Filled 2018-03-04: qty 10

## 2018-03-04 MED ORDER — PALONOSETRON HCL INJECTION 0.25 MG/5ML
0.2500 mg | Freq: Once | INTRAVENOUS | Status: AC
  Administered 2018-03-04: 0.25 mg via INTRAVENOUS

## 2018-03-04 MED ORDER — SODIUM CHLORIDE 0.9 % IV SOLN
Freq: Once | INTRAVENOUS | Status: AC
  Administered 2018-03-04: 13:00:00 via INTRAVENOUS
  Filled 2018-03-04: qty 5

## 2018-03-04 MED ORDER — HEPARIN SOD (PORK) LOCK FLUSH 100 UNIT/ML IV SOLN
500.0000 [IU] | Freq: Once | INTRAVENOUS | Status: AC | PRN
  Administered 2018-03-04: 500 [IU]
  Filled 2018-03-04: qty 5

## 2018-03-04 MED ORDER — PALONOSETRON HCL INJECTION 0.25 MG/5ML
INTRAVENOUS | Status: AC
  Filled 2018-03-04: qty 5

## 2018-03-04 MED ORDER — SODIUM CHLORIDE 0.9 % IV SOLN
Freq: Once | INTRAVENOUS | Status: AC
  Administered 2018-03-04: 13:00:00 via INTRAVENOUS
  Filled 2018-03-04: qty 250

## 2018-03-04 MED ORDER — SODIUM CHLORIDE 0.9 % IV SOLN
310.0000 mg | Freq: Once | INTRAVENOUS | Status: AC
  Administered 2018-03-04: 310 mg via INTRAVENOUS
  Filled 2018-03-04: qty 31

## 2018-03-04 NOTE — Assessment & Plan Note (Signed)
I recommend minor dose adjustment to treatment We will monitor carefully For now, she does not need further work-up, transfusion support or G-CSF

## 2018-03-04 NOTE — Assessment & Plan Note (Signed)
I have discussed her case with gynecologist oncologist She is tolerating treatment well except for some mild pancytopenia We will proceed with treatment today and next month and a plan to repeat imaging study again in March If she has good response to treatment, we will consider stopping treatment and switch her to antiestrogen therapy.  She agree with the plan of care

## 2018-03-04 NOTE — Telephone Encounter (Signed)
Scheduled appt per 01/30 los. ° °Printed calendar and avs. °

## 2018-03-04 NOTE — Assessment & Plan Note (Addendum)
She has mild peripheral neuropathy related to treatment.  Neuropathy has not worsened; in fact it is improving.  Continue gabapentin.

## 2018-03-04 NOTE — Patient Instructions (Signed)
[facility] Discharge Instructions for Patients Receiving Chemotherapy  Today you received the following chemotherapy agents Carboplatin  To help prevent nausea and vomiting after your treatment, we encourage you to take your nausea medication as directed   If you develop nausea and vomiting that is not controlled by your nausea medication, call the clinic.   BELOW ARE SYMPTOMS THAT SHOULD BE REPORTED IMMEDIATELY:  *FEVER GREATER THAN 100.5 F  *CHILLS WITH OR WITHOUT FEVER  NAUSEA AND VOMITING THAT IS NOT CONTROLLED WITH YOUR NAUSEA MEDICATION  *UNUSUAL SHORTNESS OF BREATH  *UNUSUAL BRUISING OR BLEEDING  TENDERNESS IN MOUTH AND THROAT WITH OR WITHOUT PRESENCE OF ULCERS  *URINARY PROBLEMS  *BOWEL PROBLEMS  UNUSUAL RASH Items with * indicate a potential emergency and should be followed up as soon as possible.  Feel free to call the clinic should you have any questions or concerns. The clinic phone number is [phone].  Please show the CHEMO ALERT CARD at check-in to the Emergency Department and triage nurse.   

## 2018-03-04 NOTE — Progress Notes (Signed)
[place] OFFICE PROGRESS NOTE  Patient Care Team: [name], [name], [name], [name], [name], MD as Consulting Physician (Cardiology)  ASSESSMENT & PLAN:  Granulosa cell tumor of ovary I have discussed her case with gynecologist oncologist She is tolerating treatment well except for some mild pancytopenia We will proceed with treatment today and next month and a plan to repeat imaging study again in March If she has good response to treatment, we will consider stopping treatment and switch her to antiestrogen therapy.  She agree with the plan of care  Peripheral neuropathy due to chemotherapy [facility]) She has mild peripheral neuropathy related to treatment.  Neuropathy has not worsened; in fact it is improving.  Continue gabapentin.  Pancytopenia, acquired ([place]) I recommend minor dose adjustment to treatment We will monitor carefully For now, she does not need further work-up, transfusion support or G-CSF   Orders Placed This Encounter  Procedures  . CT ABDOMEN PELVIS W CONTRAST    Standing Status:   Future    Standing Expiration Date:   03/05/2019    Order Specific Question:   If indicated for the ordered procedure, I authorize the administration of contrast media per Radiology protocol    Answer:   Yes    Order Specific Question:   Preferred imaging location?    Answer:   [facility]    Order Specific Question:   Radiology Contrast Protocol - do NOT remove file path    Answer:   [email or web address]    INTERVAL HISTORY: Please see below for problem oriented charting. She returns for chemotherapy and follow-up She feels well Denies recent infection, fever or chills She had some minor bruising since resumption of warfarin  therapy The patient denies any recent signs or symptoms of bleeding such as spontaneous epistaxis, hematuria or hematochezia. Neuropathy is improving She denies significant nausea.  No constipation.  SUMMARY OF ONCOLOGIC HISTORY:   Granulosa cell tumor of ovary   02/19/1998 Initial Diagnosis    The granulosa cell tumor was initially diagnosed at [facility] in 2000, with recurrences in 2004, 2007 and spring 2011. The original operative note from 2000 and that pathology did not transfer into present EMR and I do not know laterality of the original tumor. The surgery in 2004 included resection of the tumor with small bowel resection and colectomy for diverticulosis. She had laparoscopic repair of ventral hernia in 2005. In 2007 she had resection of pelvic recurrence of tumor plus open repair of ventral hernia with mesh. Most recent intervention was laparoscopic resection of suprapubic involvement by Dr.Hoxworth in May 2011; she had a small and asymptomatic suprapubic hernia in 2011 which has been followed. All of her disease has been very indolent and has generally been detected by CT scans. Last CT AP was April 2013; [name], self-limited episode of bowel obstruction in May 2012.    08/24/2002 Pathology Results    SOFT TISSUE MASS, [name], [name], there are tumor cells present which appear histologically similar to those noted in the patient's previous granulosa cell tumor ([id number]) and therefore, consistent with recurrent/metastatic granulosa cell tumor. The findings correlate with the cytology  material    12/06/2002 Pathology Results    1. SMALL INTESTINE, [name], SIGMOID, SEGMENTAL RESECTION:  DIVERTICULOSIS WITH FOCAL MILD CHRONIC DIVERTICULITIS. SIX BENIGN LYMPH NODES. NO TUMOR IDENTIFIED.    05/12/2005 PET scan    Again identified are enlarged lymph nodes within the right posterior cervical region and left external iliac region, [name], especially when compared with the examination dated 10/30/04.    10/01/2005 Pathology Results    1. ABDOMINAL WALL MASS: METASTATIC GRANULOSA CELL TUMOR.  2. LEFT ILIAC MASS: METASTATIC GRANULOSA CELL TUMOR.  COMMENT Both specimens are involved by metastatic tumor with features consistent with metastasis from the previously diagnosed adult granulosa cell tumor.     06/11/2007 Imaging    Stable CT the pelvis - no evidence of metastatic disease.    06/09/2008 Imaging    1.  Interval enlargement of small left perivesical nodule measuring 18 mm maximally.  This may reflect a lymph node but has a nonspecific appearance.  Attention to this area followup is recommended. 2.  Otherwise stable postsurgical appearance of the pelvis.    12/14/2008 Imaging    1.  Mild increase in size of low attenuation nodule in the left anterior pelvis indenting the urinary bladder, which now measures 2.8 cm compared with 1.8 cm on prior study.  Metastatic disease cannot be excluded. 2.  No other significant changes identified.  Stable diverticulosis and small suprapubic anterior abdominal wall hernia containing a loop of small bowel.    05/23/2009 Pathology Results    1. SOFT TISSUE MASS, SIMPLE EXCISION, : INVOLVEMENT BY NEOPLASM CONSISTENT WITH METASTATIC GRANULOSA CELL TUMOR.    06/08/2010 Imaging    1.  No acute findings. 2.  Interval progression of mild diffuse left renal atrophy and hypoperfusion, consistent with left renal artery  stenosis. 3.  Stable small hiatal hernia, tiny periumbilical hernia, and small suprapubic hernia containing a loop of small bowel.  No evidence of bowel ischemia or obstruction. 4.  Diverticulosis.  No radiographic evidence of diverticulitis.    05/22/2011 Imaging    1.  Stable exam.  No new or progressive disease identified within the abdomen or pelvis. 2.  Stable small hiatal hernia and suprapubic ventral hernia. Stable left renal atrophy, likely secondary to renal artery stenosis.    11/28/2011 Imaging    1.  No findings to suggest recurrent or metastatic disease in the abdomen or pelvis on today's examination. 2.  Indeterminate liver lesion in the right lobe of the liver is unchanged compared to numerous remote prior examinations. 3. Small suprapubic ventral hernia again noted. 4.  Extensive atherosclerosis including at least three vessel coronary artery disease.  5.  Postoperative changes, as above.     12/01/2016 Tumor Marker    Patient's tumor was tested for the following markers: Inhibin B Results of the tumor marker test revealed 119.2    12/11/2016 Imaging    Multiple new soft tissue masses in left pelvis and central small bowel mesentery, consistent with recurrent/metastatic carcinoma.     03/25/2017 Imaging    1. Omental and mesenteric tumor implants throughout the abdomen and pelvis with mixed findings when compared the prior study. Some of  these lesions are clearly smaller and others are clearly larger. No new lesions are identified. 2. Stable severe atherosclerotic calcifications. 3. Stable low anterior abdominal wall hernia. 4. Stable low-attenuation lesion involving the right hepatic lobe with adjacent calcifications or surgical clips.    10/12/2017 Imaging    Extraperitoneal fluid/hemorrhage related to the patient's dominant left pelvic tumor, as described above.  Progression of the patient's metastatic ovarian cancer, as described above. Dominant cystic/solid mass  in the left adnexa/lower pelvis has increased in size. Peritoneal disease/omental caking is mildly progressive. New pulmonary nodule at the right lung base, indeterminate.     10/12/2017 - 10/17/2017 Hospital Admission    She was admitted for retroperitoneal bleed.    10/16/2017 Imaging    1. Improving peritoneal hemorrhage. 2. No significant change in cystic and solid left pelvic mass and smaller anterior peritoneal cystic and solid mass compatible with tumor implant. 3. No evidence of metastatic disease elsewhere in the abdomen or pelvis. 4. Cardiomegaly. 5.  Calcific coronary artery and aortic atherosclerosis.  Aortic Atherosclerosis (ICD10-I70.0).    10/27/2017 Tumor Marker    Patient's tumor was tested for the following markers: Inhibin B Results of the tumor marker test revealed 98.4    10/28/2017 Procedure    Status post right IJ port catheter placement.    10/29/2017 -  Chemotherapy    The patient had carboplatin    11/24/2017 Tumor Marker    Patient's tumor was tested for the following markers: Inhibin B Results of the tumor marker test revealed 113.6    01/04/2018 Imaging    1. The left adnexal mass appears mildly decreased in size from previous exam. Associated hemorrhage with left lower abdominal hematomas have resolved in the interval. Low-density central mesenteric lesion is also slightly decreased in size in the interval. The solid and cystic lesion along the undersurface of the ventral abdominal wall is mildly increased in size in the interval. Other peritoneal nodules are either stable or decreased in the interval. 2. Right lower lobe pulmonary nodule is unchanged. 3. No new sites of disease identified. 4. Aortic Atherosclerosis (ICD10-I70.0).     Ovarian cancer ([place])   10/19/2017 Initial Diagnosis    Ovarian cancer ([place])     REVIEW OF SYSTEMS:   Constitutional: Denies fevers, chills or abnormal weight loss Eyes: Denies blurriness of vision Ears, nose, mouth,  throat, and face: Denies mucositis or sore throat Respiratory: Denies cough, dyspnea or wheezes Cardiovascular: Denies palpitation, chest discomfort or lower extremity swelling Skin: Denies abnormal skin rashes Lymphatics: Denies new lymphadenopathy  Neurological:Denies numbness, tingling or new weaknesses Behavioral/Psych: Mood is stable, no new changes  All other systems were reviewed with the patient and are negative.  I have reviewed the past medical history, past surgical history, social history and family history with the patient and they are unchanged from previous note.  ALLERGIES:  is allergic to bee venom; other; amiodarone; and prednisone.  MEDICATIONS:  Current Outpatient Medications  Medication Sig Dispense Refill  . acetaminophen (TYLENOL) 500 MG tablet Take 500 mg by mouth at bedtime as needed for mild pain.     Marland Kitchen atorvastatin (LIPITOR) 80 MG tablet TAKE 1 TABLET BY MOUTH EVERY MORNING (Patient taking differently: Take 80 mg by mouth every morning. ) 90 tablet 2  . beta carotene w/minerals (OCUVITE) tablet Take 1 tablet by mouth 2 (two) times daily.     . Calcium Carb-Cholecalciferol (CALTRATE 600+D) 600-800 MG-UNIT TABS Take 1 tablet by mouth every evening.     Marland Kitchen  clopidogrel (PLAVIX) 75 MG tablet Take 1 tablet (75 mg total) by mouth daily. 90 tablet 3  . Coenzyme Q-10 100 MG capsule Take 100-200 mg by mouth daily.     Marland Kitchen diltiazem (CARDIZEM CD) 120 MG 24 hr capsule TAKE ONE CAPSULE BY MOUTH AT BEDTIME (Patient taking differently: Take 120 mg by mouth at bedtime. ) 90 capsule 3  . diltiazem (CARDIZEM CD) 240 MG 24 hr capsule TAKE ONE CAPSULE BY MOUTH DAILY WITH BREAKFAST (Patient taking differently: Take 240 mg by mouth daily. ) 30 capsule 11  . docusate sodium (COLACE) 100 MG capsule Take 100 mg by mouth 2 (two) times daily.    . furosemide (LASIX) 40 MG tablet Take 2 tablets (80 mg total) by mouth daily. 60 tablet 11  . gabapentin (NEURONTIN) 300 MG capsule Take 1 capsule  (300 mg total) by mouth 2 (two) times daily. 180 capsule 2  . isosorbide mononitrate (IMDUR) 60 MG 24 hr tablet Take 1 tablet (60 mg total) by mouth daily. 90 tablet 3  . KLOR-CON M20 20 MEQ tablet TAKE 1 TABLET BY MOUTH EVERY DAY (Patient taking differently: Take 20 mEq by mouth at bedtime. ) 90 tablet 3  . lidocaine-prilocaine (EMLA) cream Apply 1 application topically as needed. (Patient taking differently: Apply 1 application topically as needed (port access). ) 30 g 6  . metoprolol tartrate (LOPRESSOR) 100 MG tablet Take 1 tablet (100 mg total) by mouth 2 (two) times daily. 60 tablet 9  . Multiple Vitamin (MULTIVITAMIN) capsule Take 1 capsule by mouth daily.    . nitroGLYCERIN (NITROSTAT) 0.4 MG SL tablet PLACE 1 TABLET (0.4 MG TOTAL) UNDER THE TONGUE EVERY 5 (FIVE) MINUTES AS NEEDED. FOR CHEST PAIN 25 tablet 6  . NON FORMULARY Place 1 each into the nose at bedtime. CPAP setting 3.5    . Omega-3 Fatty Acids (FISH OIL) 1200 MG CAPS Take 1,200 mg by mouth daily.     . ondansetron (ZOFRAN) 8 MG tablet Take 1 tablet (8 mg total) by mouth every 8 (eight) hours as needed for nausea. 30 tablet 3  . prochlorperazine (COMPAZINE) 10 MG tablet Take 1 tablet (10 mg total) by mouth every 6 (six) hours as needed for nausea or vomiting. 30 tablet 0  . spironolactone (ALDACTONE) 25 MG tablet Take 25 mg by mouth at bedtime as needed (swelling in ankles).     . warfarin (COUMADIN) 2 MG tablet TAKE 1 TABLET DAILY EXCEPT 1/2 TABLET ON MONDAYS AND FRIDAYS OR AS DIRECTED BY COUMADIN CLINIC. 30 tablet 2   No current facility-administered medications for this visit.    Facility-Administered Medications Ordered in Other Visits  Medication Dose Route Frequency Provider Last Rate Last Dose  . sodium chloride flush (NS) 0.9 % injection 10 mL  10 mL Intracatheter PRN Alvy Bimler, Rykker Coviello, MD   10 mL at 03/04/18 1454    PHYSICAL EXAMINATION: ECOG PERFORMANCE STATUS: 1 - Symptomatic but completely ambulatory  Vitals:    03/04/18 1227  BP: 139/62  Pulse: 80  Resp: 18  Temp: 97.7 F (36.5 C)  SpO2: 95%   Filed Weights   03/04/18 1227  Weight: 172 lb (78 kg)    GENERAL:alert, no distress and comfortable SKIN: skin color, texture, turgor are normal, no rashes or significant lesions.  Minor skin bruises near the port site is noted EYES: normal, Conjunctiva are pink and non-injected, sclera clear OROPHARYNX:no exudate, no erythema and lips, buccal mucosa, and tongue normal  NECK: supple, thyroid normal size,  non-tender, without nodularity LYMPH:  no palpable lymphadenopathy in the cervical, axillary or inguinal LUNGS: clear to auscultation and percussion with normal breathing effort HEART: regular rate & rhythm and no murmurs and no lower extremity edema ABDOMEN:abdomen soft, non-tender and normal bowel sounds Musculoskeletal:no cyanosis of digits and no clubbing  NEURO: alert & oriented x 3 with fluent speech, no focal motor/sensory deficits  LABORATORY DATA:  I have reviewed the data as listed    Component Value Date/Time   NA 143 03/04/2018 1108   NA 142 12/14/2014 1324   K 3.4 (L) 03/04/2018 1108   K 4.2 12/14/2014 1324   CL 103 03/04/2018 1108   CL 105 05/26/2012 1111   CO2 32 03/04/2018 1108   CO2 30 (H) 12/14/2014 1324   GLUCOSE 119 (H) 03/04/2018 1108   GLUCOSE 130 12/14/2014 1324   GLUCOSE 89 05/26/2012 1111   BUN 20 03/04/2018 1108   BUN 19.6 12/01/2016 1116   CREATININE 0.92 03/04/2018 1108   CREATININE 0.86 12/17/2017 0828   CREATININE 0.9 12/01/2016 1116   CALCIUM 9.0 03/04/2018 1108   CALCIUM 9.1 12/14/2014 1324   PROT 6.6 03/04/2018 1108   PROT 6.5 10/08/2017 0905   PROT 6.5 12/14/2014 1324   ALBUMIN 3.4 (L) 03/04/2018 1108   ALBUMIN 3.9 10/08/2017 0905   ALBUMIN 3.4 (L) 12/14/2014 1324   AST 28 03/04/2018 1108   AST 25 12/17/2017 0828   AST 29 12/14/2014 1324   ALT 18 03/04/2018 1108   ALT 26 12/17/2017 0828   ALT 20 12/14/2014 1324   ALKPHOS 117 03/04/2018 1108    ALKPHOS 103 12/14/2014 1324   BILITOT 0.7 03/04/2018 1108   BILITOT 0.5 12/17/2017 0828   BILITOT 0.77 12/14/2014 1324   GFRNONAA 57 (L) 03/04/2018 1108   GFRNONAA 60 (L) 12/17/2017 0828   GFRAA >60 03/04/2018 1108   GFRAA >60 12/17/2017 0828    No results found for: SPEP, UPEP  Lab Results  Component Value Date   WBC 4.0 03/04/2018   NEUTROABS 2.4 03/04/2018   HGB 11.2 (L) 03/04/2018   HCT 34.8 (L) 03/04/2018   MCV 110.1 (H) 03/04/2018   PLT 111 (L) 03/04/2018      Chemistry      Component Value Date/Time   NA 143 03/04/2018 1108   NA 142 12/14/2014 1324   K 3.4 (L) 03/04/2018 1108   K 4.2 12/14/2014 1324   CL 103 03/04/2018 1108   CL 105 05/26/2012 1111   CO2 32 03/04/2018 1108   CO2 30 (H) 12/14/2014 1324   BUN 20 03/04/2018 1108   BUN 19.6 12/01/2016 1116   CREATININE 0.92 03/04/2018 1108   CREATININE 0.86 12/17/2017 0828   CREATININE 0.9 12/01/2016 1116      Component Value Date/Time   CALCIUM 9.0 03/04/2018 1108   CALCIUM 9.1 12/14/2014 1324   ALKPHOS 117 03/04/2018 1108   ALKPHOS 103 12/14/2014 1324   AST 28 03/04/2018 1108   AST 25 12/17/2017 0828   AST 29 12/14/2014 1324   ALT 18 03/04/2018 1108   ALT 26 12/17/2017 0828   ALT 20 12/14/2014 1324   BILITOT 0.7 03/04/2018 1108   BILITOT 0.5 12/17/2017 0828   BILITOT 0.77 12/14/2014 1324       All questions were answered. The patient knows to call the clinic with any problems, questions or concerns. No barriers to learning was detected.  I spent 15 minutes counseling the patient face to face. The total time spent in the  appointment was 20 minutes and more than 50% was on counseling and review of test results  Heath Lark, MD 03/04/2018 3:46 PM

## 2018-03-08 ENCOUNTER — Telehealth: Payer: Self-pay | Admitting: Interventional Cardiology

## 2018-03-08 ENCOUNTER — Telehealth: Payer: Self-pay

## 2018-03-08 NOTE — Telephone Encounter (Signed)
Returned call to patient. She states that she had an episode earlier where she felt a little SOB and has some tightness in her chest. She states that she took some deep breaths and her Sx have resolved. She denies additional Sx. She states that she has just been fatigued lately. She states that she thinks that this is related to her chemo. She states that her BP was 149/52 HR 61. Patient denies having any Sx at this time. Patient states that she does not feel like she needs to be seen in the ER or come in for an appointment at this time. She states that she just wanted to let us know and that she will continue to monitor and let us know if her Sx return. Patient has prn NTG and states that she will let us know if she needs anything further.

## 2018-03-08 NOTE — Telephone Encounter (Signed)
She called and left a message to call her.  Called back. She is having extreme fatigue. Bp- 143/53. She is not feeling well and feels like she is in Atrial Fib. Eating and drinking with no problems. Complainig of chest discomfort/ pressure that started last night. Shortness of breath with exertion.  Instructed to call 911 or go to ER to be evaluated. She verbalized understanding. She states that she is going to call her Cardiologist office first to see what they  Instruct her to do.

## 2018-03-08 NOTE — Telephone Encounter (Signed)
New Message    Patient c/o Palpitations:  High priority if patient c/o lightheadedness, shortness of breath, or chest pain  How long have you had palpitations/irregular HR/ Afib? Are you having the symptoms now? Yes patient states it comes and goes   1) Are you currently experiencing lightheadedness, SOB or CP? No   2) Do you have a history of afib (atrial fibrillation) or irregular heart rhythm? Yes patient states she stays in afib  3) Have you checked your BP or HR? (document readings if available): BP-149/52  HR-61  4) Are you experiencing any other symptoms? Fatigue, weakness, sleeping 10 hours plus,   Patient states she was told to take extra Furosemide wants to make sure its okay with cardiologist first.  Patient also states its chest pressure not pain and she's also on Chemo so not sure if that could be the cause.

## 2018-03-09 LAB — INHIBIN B: Inhibin B: 135.5 pg/mL — ABNORMAL HIGH (ref 0.0–16.9)

## 2018-03-17 DIAGNOSIS — F4323 Adjustment disorder with mixed anxiety and depressed mood: Secondary | ICD-10-CM | POA: Diagnosis not present

## 2018-03-24 ENCOUNTER — Ambulatory Visit (INDEPENDENT_AMBULATORY_CARE_PROVIDER_SITE_OTHER): Payer: Medicare Other | Admitting: Pharmacist

## 2018-03-24 DIAGNOSIS — Z5181 Encounter for therapeutic drug level monitoring: Secondary | ICD-10-CM | POA: Diagnosis not present

## 2018-03-24 DIAGNOSIS — I4891 Unspecified atrial fibrillation: Secondary | ICD-10-CM | POA: Diagnosis not present

## 2018-03-24 LAB — POCT INR: INR: 2 (ref 2.0–3.0)

## 2018-03-24 NOTE — Patient Instructions (Signed)
Description   Take an extra 1/2 tablet today since you already ate greens today, then start taking 1 tablet daily. Recheck INR in 3 weeks. Main [phone]. Coumadin Clinic # [phone]

## 2018-03-30 ENCOUNTER — Inpatient Hospital Stay (HOSPITAL_BASED_OUTPATIENT_CLINIC_OR_DEPARTMENT_OTHER): Payer: Medicare Other | Admitting: Hematology and Oncology

## 2018-03-30 ENCOUNTER — Encounter: Payer: Self-pay | Admitting: Hematology and Oncology

## 2018-03-30 ENCOUNTER — Inpatient Hospital Stay: Payer: Medicare Other

## 2018-03-30 ENCOUNTER — Inpatient Hospital Stay: Payer: Medicare Other | Attending: Hematology and Oncology

## 2018-03-30 DIAGNOSIS — D61818 Other pancytopenia: Secondary | ICD-10-CM | POA: Insufficient documentation

## 2018-03-30 DIAGNOSIS — C569 Malignant neoplasm of unspecified ovary: Secondary | ICD-10-CM

## 2018-03-30 DIAGNOSIS — Z9221 Personal history of antineoplastic chemotherapy: Secondary | ICD-10-CM | POA: Diagnosis not present

## 2018-03-30 DIAGNOSIS — Z5111 Encounter for antineoplastic chemotherapy: Secondary | ICD-10-CM | POA: Diagnosis not present

## 2018-03-30 DIAGNOSIS — E440 Moderate protein-calorie malnutrition: Secondary | ICD-10-CM

## 2018-03-30 DIAGNOSIS — Z79899 Other long term (current) drug therapy: Secondary | ICD-10-CM

## 2018-03-30 DIAGNOSIS — T451X5A Adverse effect of antineoplastic and immunosuppressive drugs, initial encounter: Secondary | ICD-10-CM

## 2018-03-30 DIAGNOSIS — G620 Drug-induced polyneuropathy: Secondary | ICD-10-CM | POA: Diagnosis not present

## 2018-03-30 DIAGNOSIS — I5040 Unspecified combined systolic (congestive) and diastolic (congestive) heart failure: Secondary | ICD-10-CM

## 2018-03-30 DIAGNOSIS — D3910 Neoplasm of uncertain behavior of unspecified ovary: Secondary | ICD-10-CM

## 2018-03-30 DIAGNOSIS — Z95828 Presence of other vascular implants and grafts: Secondary | ICD-10-CM

## 2018-03-30 LAB — CBC WITH DIFFERENTIAL/PLATELET
Abs Immature Granulocytes: 0.01 10*3/uL (ref 0.00–0.07)
BASOS ABS: 0 10*3/uL (ref 0.0–0.1)
Basophils Relative: 1 %
Eosinophils Absolute: 0.1 10*3/uL (ref 0.0–0.5)
Eosinophils Relative: 3 %
HEMATOCRIT: 32.9 % — AB (ref 36.0–46.0)
Hemoglobin: 10.8 g/dL — ABNORMAL LOW (ref 12.0–15.0)
Immature Granulocytes: 0 %
Lymphocytes Relative: 30 %
Lymphs Abs: 1.2 10*3/uL (ref 0.7–4.0)
MCH: 36.2 pg — ABNORMAL HIGH (ref 26.0–34.0)
MCHC: 32.8 g/dL (ref 30.0–36.0)
MCV: 110.4 fL — ABNORMAL HIGH (ref 80.0–100.0)
Monocytes Absolute: 0.6 10*3/uL (ref 0.1–1.0)
Monocytes Relative: 14 %
NRBC: 0 % (ref 0.0–0.2)
Neutro Abs: 2.2 10*3/uL (ref 1.7–7.7)
Neutrophils Relative %: 52 %
Platelets: 113 10*3/uL — ABNORMAL LOW (ref 150–400)
RBC: 2.98 MIL/uL — ABNORMAL LOW (ref 3.87–5.11)
RDW: 14.8 % (ref 11.5–15.5)
WBC: 4.1 10*3/uL (ref 4.0–10.5)

## 2018-03-30 LAB — COMPREHENSIVE METABOLIC PANEL
ALT: 17 U/L (ref 0–44)
AST: 28 U/L (ref 15–41)
Albumin: 3.2 g/dL — ABNORMAL LOW (ref 3.5–5.0)
Alkaline Phosphatase: 120 U/L (ref 38–126)
Anion gap: 9 (ref 5–15)
BUN: 18 mg/dL (ref 8–23)
CO2: 33 mmol/L — ABNORMAL HIGH (ref 22–32)
Calcium: 8.8 mg/dL — ABNORMAL LOW (ref 8.9–10.3)
Chloride: 103 mmol/L (ref 98–111)
Creatinine, Ser: 0.81 mg/dL (ref 0.44–1.00)
GFR calc non Af Amer: >60 mL/min (ref >60)
Glucose, Bld: 80 mg/dL (ref 70–99)
Potassium: 3.2 mmol/L — ABNORMAL LOW (ref 3.5–5.1)
Sodium: 145 mmol/L (ref 135–145)
TOTAL PROTEIN: 6.4 g/dL — AB (ref 6.5–8.1)
Total Bilirubin: 0.6 mg/dL (ref 0.3–1.2)

## 2018-03-30 MED ORDER — SODIUM CHLORIDE 0.9 % IV SOLN
Freq: Once | INTRAVENOUS | Status: AC
  Administered 2018-03-30: 14:00:00 via INTRAVENOUS
  Filled 2018-03-30: qty 250

## 2018-03-30 MED ORDER — SODIUM CHLORIDE 0.9 % IV SOLN
20.0000 mg | Freq: Once | INTRAVENOUS | Status: AC
  Administered 2018-03-30: 20 mg via INTRAVENOUS
  Filled 2018-03-30: qty 2

## 2018-03-30 MED ORDER — FAMOTIDINE IN NACL 20-0.9 MG/50ML-% IV SOLN: 20.0000 mg | Freq: Once | INTRAVENOUS | Status: DC

## 2018-03-30 MED ORDER — PALONOSETRON HCL INJECTION 0.25 MG/5ML
INTRAVENOUS | Status: AC
  Filled 2018-03-30: qty 5

## 2018-03-30 MED ORDER — DIPHENHYDRAMINE HCL 25 MG PO CAPS
ORAL_CAPSULE | ORAL | Status: AC
  Filled 2018-03-30: qty 1

## 2018-03-30 MED ORDER — SODIUM CHLORIDE 0.9 % IV SOLN
310.0000 mg | Freq: Once | INTRAVENOUS | Status: AC
  Administered 2018-03-30: 310 mg via INTRAVENOUS
  Filled 2018-03-30: qty 31

## 2018-03-30 MED ORDER — SODIUM CHLORIDE 0.9% FLUSH
10.0000 mL | INTRAVENOUS | Status: DC | PRN
  Administered 2018-03-30: 10 mL via INTRAVENOUS
  Filled 2018-03-30: qty 10

## 2018-03-30 MED ORDER — HEPARIN SOD (PORK) LOCK FLUSH 100 UNIT/ML IV SOLN
500.0000 [IU] | Freq: Once | INTRAVENOUS | Status: AC | PRN
  Administered 2018-03-30: 500 [IU]
  Filled 2018-03-30: qty 5

## 2018-03-30 MED ORDER — SODIUM CHLORIDE 0.9 % IV SOLN
Freq: Once | INTRAVENOUS | Status: AC
  Administered 2018-03-30: 15:00:00 via INTRAVENOUS
  Filled 2018-03-30: qty 5

## 2018-03-30 MED ORDER — PALONOSETRON HCL INJECTION 0.25 MG/5ML
0.2500 mg | Freq: Once | INTRAVENOUS | Status: AC
  Administered 2018-03-30: 0.25 mg via INTRAVENOUS

## 2018-03-30 MED ORDER — SODIUM CHLORIDE 0.9% FLUSH
10.0000 mL | INTRAVENOUS | Status: DC | PRN
  Administered 2018-03-30: 10 mL
  Filled 2018-03-30: qty 10

## 2018-03-30 MED ORDER — DIPHENHYDRAMINE HCL 25 MG PO CAPS
25.0000 mg | ORAL_CAPSULE | Freq: Once | ORAL | Status: AC
  Administered 2018-03-30: 25 mg via ORAL

## 2018-03-30 NOTE — Progress Notes (Signed)
[facility] OFFICE PROGRESS NOTE  Patient Care Team: [name], [name], [name], [name], [name], MD as Consulting Physician (Cardiology)  ASSESSMENT & PLAN:  Granulosa cell tumor of ovary She is tolerating treatment well except for some mild pancytopenia We will proceed with treatment today and plan to repeat imaging study again in March If she has good response to treatment, we will consider stopping treatment and switch her to antiestrogen therapy.  She agree with the plan of care  Pancytopenia, acquired ([place]) I recommend minor dose adjustment to treatment We will monitor carefully For now, she does not need further work-up, transfusion support or G-CSF  Peripheral neuropathy due to chemotherapy [facility]) She has mild peripheral neuropathy related to treatment.  Neuropathy has not worsened; in fact it is improving.  Continue gabapentin.   No orders of the defined types were placed in this encounter.   INTERVAL HISTORY: Please see below for problem oriented charting. She returns for chemotherapy and follow-up She feels well with recent treatment Denies nausea or changes in bowel habits No recent infection, fever or chills She tolerated anticoagulation therapy well The patient denies any recent signs or symptoms of bleeding such as spontaneous epistaxis, hematuria or hematochezia. No worsening neuropathy.  SUMMARY OF ONCOLOGIC HISTORY:   Granulosa cell tumor of ovary   02/19/1998 Initial Diagnosis    The granulosa cell tumor was initially diagnosed at [facility] in 2000, with recurrences in 2004, 2007 and spring 2011. The original operative note from 2000 and that pathology did not transfer into present EMR and I do not know laterality of the original tumor. The  surgery in 2004 included resection of the tumor with small bowel resection and colectomy for diverticulosis. She had laparoscopic repair of ventral hernia in 2005. In 2007 she had resection of pelvic recurrence of tumor plus open repair of ventral hernia with mesh. Most recent intervention was laparoscopic resection of suprapubic involvement by Dr.Hoxworth in May 2011; she had a small and asymptomatic suprapubic hernia in 2011 which has been followed. All of her disease has been very indolent and has generally been detected by CT scans. Last CT AP was April 2013; [name], self-limited episode of bowel obstruction in May 2012.    08/24/2002 Pathology Results    SOFT TISSUE MASS, [name], [name], there are tumor cells present which appear histologically similar to those noted in the patient's previous granulosa cell tumor ([id number]) and therefore, consistent with recurrent/metastatic granulosa cell tumor. The findings correlate with the cytology material    12/06/2002 Pathology Results    1. SMALL INTESTINE, [name], SIGMOID, SEGMENTAL RESECTION: DIVERTICULOSIS WITH FOCAL MILD CHRONIC DIVERTICULITIS. SIX BENIGN LYMPH NODES. NO TUMOR IDENTIFIED.    05/12/2005 PET scan    Again identified are enlarged lymph nodes within the right posterior cervical region and left external iliac region, as well as a large omental or peritoneal deposit just below the right rectus sheath.  All of these exhibit very low-level FDG uptake which likely reflects the hypometabolic state of this tumor.  Clearly these  masses have increased over time, especially when compared with the examination dated 10/30/04.    10/01/2005  Pathology Results    1. ABDOMINAL WALL MASS: METASTATIC GRANULOSA CELL TUMOR.  2. LEFT ILIAC MASS: METASTATIC GRANULOSA CELL TUMOR.  COMMENT Both specimens are involved by metastatic tumor with features consistent with metastasis from the previously diagnosed adult granulosa cell tumor.     06/11/2007 Imaging    Stable CT the pelvis - no evidence of metastatic disease.    06/09/2008 Imaging    1.  Interval enlargement of small left perivesical nodule measuring 18 mm maximally.  This may reflect a lymph node but has a nonspecific appearance.  Attention to this area followup is recommended. 2.  Otherwise stable postsurgical appearance of the pelvis.    12/14/2008 Imaging    1.  Mild increase in size of low attenuation nodule in the left anterior pelvis indenting the urinary bladder, which now measures 2.8 cm compared with 1.8 cm on prior study.  Metastatic disease cannot be excluded. 2.  No other significant changes identified.  Stable diverticulosis and small suprapubic anterior abdominal wall hernia containing a loop of small bowel.    05/23/2009 Pathology Results    1. SOFT TISSUE MASS, SIMPLE EXCISION, : INVOLVEMENT BY NEOPLASM CONSISTENT WITH METASTATIC GRANULOSA CELL TUMOR.    06/08/2010 Imaging    1.  No acute findings. 2.  Interval progression of mild diffuse left renal atrophy and hypoperfusion, consistent with left renal artery stenosis. 3.  Stable small hiatal hernia, tiny periumbilical hernia, and small suprapubic hernia containing a loop of small bowel.  No evidence of bowel ischemia or obstruction. 4.  Diverticulosis.  No radiographic evidence of diverticulitis.    05/22/2011 Imaging    1.  Stable exam.  No new or progressive disease identified within the abdomen or pelvis. 2.  Stable small hiatal hernia and suprapubic ventral hernia. Stable left renal atrophy, likely secondary to renal artery stenosis.    11/28/2011 Imaging    1.  No findings to suggest recurrent or  metastatic disease in the abdomen or pelvis on today's examination. 2.  Indeterminate liver lesion in the right lobe of the liver is unchanged compared to numerous remote prior examinations. 3. Small suprapubic ventral hernia again noted. 4.  Extensive atherosclerosis including at least three vessel coronary artery disease.  5.  Postoperative changes, as above.     12/01/2016 Tumor Marker    Patient's tumor was tested for the following markers: Inhibin B Results of the tumor marker test revealed 119.2    12/11/2016 Imaging    Multiple new soft tissue masses in left pelvis and central small bowel mesentery, consistent with recurrent/metastatic carcinoma.     03/25/2017 Imaging    1. Omental and mesenteric tumor implants throughout the abdomen and pelvis with mixed findings when compared the prior study. Some of these lesions are clearly smaller and others are clearly larger. No new lesions are identified. 2. Stable severe atherosclerotic calcifications. 3. Stable low anterior abdominal wall hernia. 4. Stable low-attenuation lesion involving the right hepatic lobe with adjacent calcifications or surgical clips.    10/12/2017 Imaging    Extraperitoneal fluid/hemorrhage related to the patient's dominant left pelvic tumor, as described above.  Progression of the patient's metastatic ovarian cancer, as described above. Dominant cystic/solid mass in the left adnexa/lower pelvis has increased in size. Peritoneal disease/omental caking is mildly progressive. New pulmonary nodule at the right lung base, indeterminate.     10/12/2017 - 10/17/2017 Hospital  Admission    She was admitted for retroperitoneal bleed.    10/16/2017 Imaging    1. Improving peritoneal hemorrhage. 2. No significant change in cystic and solid left pelvic mass and smaller anterior peritoneal cystic and solid mass compatible with tumor implant. 3. No evidence of metastatic disease elsewhere in the abdomen or pelvis. 4.  Cardiomegaly. 5.  Calcific coronary artery and aortic atherosclerosis.  Aortic Atherosclerosis (ICD10-I70.0).    10/27/2017 Tumor Marker    Patient's tumor was tested for the following markers: Inhibin B Results of the tumor marker test revealed 98.4    10/28/2017 Procedure    Status post right IJ port catheter placement.    10/29/2017 -  Chemotherapy    The patient had carboplatin    11/24/2017 Tumor Marker    Patient's tumor was tested for the following markers: Inhibin B Results of the tumor marker test revealed 113.6    01/04/2018 Imaging    1. The left adnexal mass appears mildly decreased in size from previous exam. Associated hemorrhage with left lower abdominal hematomas have resolved in the interval. Low-density central mesenteric lesion is also slightly decreased in size in the interval. The solid and cystic lesion along the undersurface of the ventral abdominal wall is mildly increased in size in the interval. Other peritoneal nodules are either stable or decreased in the interval. 2. Right lower lobe pulmonary nodule is unchanged. 3. No new sites of disease identified. 4. Aortic Atherosclerosis (ICD10-I70.0).    03/08/2018 Tumor Marker    Patient's tumor was tested for the following markers: Inhibin B Results of the tumor marker test revealed 135.5     Ovarian cancer ([place])   10/19/2017 Initial Diagnosis    Ovarian cancer ([place])     REVIEW OF SYSTEMS:   Constitutional: Denies fevers, chills or abnormal weight loss Eyes: Denies blurriness of vision Ears, nose, mouth, throat, and face: Denies mucositis or sore throat Respiratory: Denies cough, dyspnea or wheezes Cardiovascular: Denies palpitation, chest discomfort or lower extremity swelling Gastrointestinal:  Denies nausea, heartburn or change in bowel habits Skin: Denies abnormal skin rashes Lymphatics: Denies new lymphadenopathy Neurological:Denies numbness, tingling or new weaknesses Behavioral/Psych: Mood is  stable, no new changes  All other systems were reviewed with the patient and are negative.  I have reviewed the past medical history, past surgical history, social history and family history with the patient and they are unchanged from previous note.  ALLERGIES:  is allergic to bee venom; other; amiodarone; and prednisone.  MEDICATIONS:  Current Outpatient Medications  Medication Sig Dispense Refill  . acetaminophen (TYLENOL) 500 MG tablet Take 500 mg by mouth at bedtime as needed for mild pain.     Marland Kitchen atorvastatin (LIPITOR) 80 MG tablet TAKE 1 TABLET BY MOUTH EVERY MORNING (Patient taking differently: Take 80 mg by mouth every morning. ) 90 tablet 2  . beta carotene w/minerals (OCUVITE) tablet Take 1 tablet by mouth 2 (two) times daily.     . Calcium Carb-Cholecalciferol (CALTRATE 600+D) 600-800 MG-UNIT TABS Take 1 tablet by mouth every evening.     . clopidogrel (PLAVIX) 75 MG tablet Take 1 tablet (75 mg total) by mouth daily. 90 tablet 3  . Coenzyme Q-10 100 MG capsule Take 100-200 mg by mouth daily.     Marland Kitchen diltiazem (CARDIZEM CD) 120 MG 24 hr capsule TAKE ONE CAPSULE BY MOUTH AT BEDTIME (Patient taking differently: Take 120 mg by mouth at bedtime. ) 90 capsule 3  . diltiazem (CARDIZEM CD) 240 MG  24 hr capsule TAKE ONE CAPSULE BY MOUTH DAILY WITH BREAKFAST (Patient taking differently: Take 240 mg by mouth daily. ) 30 capsule 11  . docusate sodium (COLACE) 100 MG capsule Take 100 mg by mouth 2 (two) times daily.    . furosemide (LASIX) 40 MG tablet Take 2 tablets (80 mg total) by mouth daily. 60 tablet 11  . gabapentin (NEURONTIN) 300 MG capsule Take 1 capsule (300 mg total) by mouth 2 (two) times daily. 180 capsule 2  . isosorbide mononitrate (IMDUR) 60 MG 24 hr tablet Take 1 tablet (60 mg total) by mouth daily. 90 tablet 3  . KLOR-CON M20 20 MEQ tablet TAKE 1 TABLET BY MOUTH EVERY DAY (Patient taking differently: Take 20 mEq by mouth at bedtime. ) 90 tablet 3  . lidocaine-prilocaine (EMLA)  cream Apply 1 application topically as needed. (Patient taking differently: Apply 1 application topically as needed (port access). ) 30 g 6  . metoprolol tartrate (LOPRESSOR) 100 MG tablet Take 1 tablet (100 mg total) by mouth 2 (two) times daily. 60 tablet 9  . Multiple Vitamin (MULTIVITAMIN) capsule Take 1 capsule by mouth daily.    . nitroGLYCERIN (NITROSTAT) 0.4 MG SL tablet PLACE 1 TABLET (0.4 MG TOTAL) UNDER THE TONGUE EVERY 5 (FIVE) MINUTES AS NEEDED. FOR CHEST PAIN 25 tablet 6  . NON FORMULARY Place 1 each into the nose at bedtime. CPAP setting 3.5    . Omega-3 Fatty Acids (FISH OIL) 1200 MG CAPS Take 1,200 mg by mouth daily.     . ondansetron (ZOFRAN) 8 MG tablet Take 1 tablet (8 mg total) by mouth every 8 (eight) hours as needed for nausea. 30 tablet 3  . prochlorperazine (COMPAZINE) 10 MG tablet Take 1 tablet (10 mg total) by mouth every 6 (six) hours as needed for nausea or vomiting. 30 tablet 0  . spironolactone (ALDACTONE) 25 MG tablet Take 25 mg by mouth at bedtime as needed (swelling in ankles).     . warfarin (COUMADIN) 2 MG tablet TAKE 1 TABLET DAILY EXCEPT 1/2 TABLET ON MONDAYS AND FRIDAYS OR AS DIRECTED BY COUMADIN CLINIC. 30 tablet 2   No current facility-administered medications for this visit.    Facility-Administered Medications Ordered in Other Visits  Medication Dose Route Frequency Provider Last Rate Last Dose  . CARBOplatin (PARAPLATIN) 310 mg in sodium chloride 0.9 % 250 mL chemo infusion  310 mg Intravenous Once Alvy Bimler, Orvis Stann, MD      . famotidine (PEPCID) 20 mg in sodium chloride 0.9 % 100 mL IVPB  20 mg Intravenous Once Alvy Bimler, Beauden Tremont, MD      . fosaprepitant (EMEND) 150 mg, dexamethasone (DECADRON) 4 mg in sodium chloride 0.9 % 145 mL IVPB   Intravenous Once Kayleann Mccaffery, MD      . heparin lock flush 100 unit/mL  500 Units Intracatheter Once PRN Alvy Bimler, Annie Saephan, MD      . sodium chloride flush (NS) 0.9 % injection 10 mL  10 mL Intracatheter PRN Alvy Bimler, Francene Mcerlean, MD         PHYSICAL EXAMINATION: ECOG PERFORMANCE STATUS: 1 - Symptomatic but completely ambulatory  Vitals:   03/30/18 1310  BP: (!) 144/55  Pulse: 60  Resp: 17  Temp: (!) 97.5 F (36.4 C)  SpO2: 98%   Filed Weights   03/30/18 1310  Weight: 173 lb 6.4 oz (78.7 kg)    GENERAL:alert, no distress and comfortable SKIN: skin color, texture, turgor are normal, no rashes or significant lesions.  Noted skin bruises  EYES: normal, Conjunctiva are pink and non-injected, sclera clear OROPHARYNX:no exudate, no erythema and lips, buccal mucosa, and tongue normal  NECK: supple, thyroid normal size, non-tender, without nodularity LYMPH:  no palpable lymphadenopathy in the cervical, axillary or inguinal LUNGS: clear to auscultation and percussion with normal breathing effort HEART: regular rate & rhythm and no murmurs and no lower extremity edema ABDOMEN:abdomen soft, non-tender and normal bowel sounds Musculoskeletal:no cyanosis of digits and no clubbing  NEURO: alert & oriented x 3 with fluent speech, no focal motor/sensory deficits  LABORATORY DATA:  I have reviewed the data as listed    Component Value Date/Time   NA 145 03/30/2018 1255   NA 142 12/14/2014 1324   K 3.2 (L) 03/30/2018 1255   K 4.2 12/14/2014 1324   CL 103 03/30/2018 1255   CL 105 05/26/2012 1111   CO2 33 (H) 03/30/2018 1255   CO2 30 (H) 12/14/2014 1324   GLUCOSE 80 03/30/2018 1255   GLUCOSE 130 12/14/2014 1324   GLUCOSE 89 05/26/2012 1111   BUN 18 03/30/2018 1255   BUN 19.6 12/01/2016 1116   CREATININE 0.81 03/30/2018 1255   CREATININE 0.86 12/17/2017 0828   CREATININE 0.9 12/01/2016 1116   CALCIUM 8.8 (L) 03/30/2018 1255   CALCIUM 9.1 12/14/2014 1324   PROT 6.4 (L) 03/30/2018 1255   PROT 6.5 10/08/2017 0905   PROT 6.5 12/14/2014 1324   ALBUMIN 3.2 (L) 03/30/2018 1255   ALBUMIN 3.9 10/08/2017 0905   ALBUMIN 3.4 (L) 12/14/2014 1324   AST 28 03/30/2018 1255   AST 25 12/17/2017 0828   AST 29 12/14/2014 1324    ALT 17 03/30/2018 1255   ALT 26 12/17/2017 0828   ALT 20 12/14/2014 1324   ALKPHOS 120 03/30/2018 1255   ALKPHOS 103 12/14/2014 1324   BILITOT 0.6 03/30/2018 1255   BILITOT 0.5 12/17/2017 0828   BILITOT 0.77 12/14/2014 1324   GFRNONAA >60 03/30/2018 1255   GFRNONAA 60 (L) 12/17/2017 0828   GFRAA >60 03/30/2018 1255   GFRAA >60 12/17/2017 0828    No results found for: SPEP, UPEP  Lab Results  Component Value Date   WBC 4.1 03/30/2018   NEUTROABS 2.2 03/30/2018   HGB 10.8 (L) 03/30/2018   HCT 32.9 (L) 03/30/2018   MCV 110.4 (H) 03/30/2018   PLT 113 (L) 03/30/2018      Chemistry      Component Value Date/Time   NA 145 03/30/2018 1255   NA 142 12/14/2014 1324   K 3.2 (L) 03/30/2018 1255   K 4.2 12/14/2014 1324   CL 103 03/30/2018 1255   CL 105 05/26/2012 1111   CO2 33 (H) 03/30/2018 1255   CO2 30 (H) 12/14/2014 1324   BUN 18 03/30/2018 1255   BUN 19.6 12/01/2016 1116   CREATININE 0.81 03/30/2018 1255   CREATININE 0.86 12/17/2017 0828   CREATININE 0.9 12/01/2016 1116      Component Value Date/Time   CALCIUM 8.8 (L) 03/30/2018 1255   CALCIUM 9.1 12/14/2014 1324   ALKPHOS 120 03/30/2018 1255   ALKPHOS 103 12/14/2014 1324   AST 28 03/30/2018 1255   AST 25 12/17/2017 0828   AST 29 12/14/2014 1324   ALT 17 03/30/2018 1255   ALT 26 12/17/2017 0828   ALT 20 12/14/2014 1324   BILITOT 0.6 03/30/2018 1255   BILITOT 0.5 12/17/2017 0828   BILITOT 0.77 12/14/2014 1324      All questions were answered. The patient knows to call the clinic with  any problems, questions or concerns. No barriers to learning was detected.  I spent 15 minutes counseling the patient face to face. The total time spent in the appointment was 20 minutes and more than 50% was on counseling and review of test results  Heath Lark, MD 03/30/2018 2:10 PM

## 2018-03-30 NOTE — Patient Instructions (Signed)
[facility] Discharge Instructions for Patients Receiving Chemotherapy  Today you received the following chemotherapy agents Carboplatin (PARAPLATIN).  To help prevent nausea and vomiting after your treatment, we encourage you to take your nausea medication as prescribed.   If you develop nausea and vomiting that is not controlled by your nausea medication, call the clinic.   BELOW ARE SYMPTOMS THAT SHOULD BE REPORTED IMMEDIATELY:  *FEVER GREATER THAN 100.5 F  *CHILLS WITH OR WITHOUT FEVER  NAUSEA AND VOMITING THAT IS NOT CONTROLLED WITH YOUR NAUSEA MEDICATION  *UNUSUAL SHORTNESS OF BREATH  *UNUSUAL BRUISING OR BLEEDING  TENDERNESS IN MOUTH AND THROAT WITH OR WITHOUT PRESENCE OF ULCERS  *URINARY PROBLEMS  *BOWEL PROBLEMS  UNUSUAL RASH Items with * indicate a potential emergency and should be followed up as soon as possible.  Feel free to call the clinic should you have any questions or concerns. The clinic phone number is [phone].  Please show the CHEMO ALERT CARD at check-in to the Emergency Department and triage nurse.   

## 2018-03-30 NOTE — Assessment & Plan Note (Signed)
She has mild peripheral neuropathy related to treatment.  Neuropathy has not worsened; in fact it is improving.  Continue gabapentin.

## 2018-03-30 NOTE — Assessment & Plan Note (Signed)
She is tolerating treatment well except for some mild pancytopenia We will proceed with treatment today and plan to repeat imaging study again in March If she has good response to treatment, we will consider stopping treatment and switch her to antiestrogen therapy.  She agree with the plan of care

## 2018-03-30 NOTE — Assessment & Plan Note (Signed)
I recommend minor dose adjustment to treatment We will monitor carefully For now, she does not need further work-up, transfusion support or G-CSF

## 2018-04-01 LAB — INHIBIN B: Inhibin B: 168.9 pg/mL — ABNORMAL HIGH (ref 0.0–16.9)

## 2018-04-05 DIAGNOSIS — J069 Acute upper respiratory infection, unspecified: Secondary | ICD-10-CM | POA: Diagnosis not present

## 2018-04-07 ENCOUNTER — Other Ambulatory Visit: Payer: Self-pay | Admitting: Interventional Cardiology

## 2018-04-14 ENCOUNTER — Other Ambulatory Visit: Payer: Self-pay

## 2018-04-14 ENCOUNTER — Ambulatory Visit (INDEPENDENT_AMBULATORY_CARE_PROVIDER_SITE_OTHER): Payer: Medicare Other | Admitting: Pharmacist

## 2018-04-14 DIAGNOSIS — I4891 Unspecified atrial fibrillation: Secondary | ICD-10-CM | POA: Diagnosis not present

## 2018-04-14 DIAGNOSIS — Z5181 Encounter for therapeutic drug level monitoring: Secondary | ICD-10-CM

## 2018-04-14 DIAGNOSIS — F4323 Adjustment disorder with mixed anxiety and depressed mood: Secondary | ICD-10-CM | POA: Diagnosis not present

## 2018-04-14 LAB — POCT INR: INR: 2 (ref 2.0–3.0)

## 2018-04-14 NOTE — Patient Instructions (Signed)
Continue taking 1 tablet daily. Recheck INR in 3 weeks. Main [phone]. Coumadin Clinic # [phone]

## 2018-04-15 ENCOUNTER — Telehealth: Payer: Self-pay | Admitting: Interventional Cardiology

## 2018-04-15 NOTE — Telephone Encounter (Signed)
Returned call to patient. She states that she has a cough and that she has some Sob on exertion. Patient denies chest pain, weight gain, or fever. Patient states that her ankles are slightly swollen. Patient is concerned with the coronavirus. Patient denies recent travel. Patient states that she reached out to her PCP who thinks that she may have a cold. Patient takes lasix 80 mg QD. Patient has prn spironolactone 25 mg. Instructed patient to take prn spiro to see if her SOB and swelling improves. Patient states that her PCP said that he would see her early next week if her Sx did not improve. Instructed patient to continue to monitor and let us know if her Sx did not improve. Patient verbalized understanding and thanked me for the call.

## 2018-04-15 NOTE — Telephone Encounter (Signed)
Pt c/o Shortness Of Breath: STAT if SOB developed within the last 24 hours or pt is noticeably SOB on the phone  1. Are you currently SOB (can you hear that pt is SOB on the phone)? NO  2. How long have you been experiencing SOB? About a week  3. Are you SOB when sitting or when up moving around? Only when moving around, more when she bends over.   4. Are you currently experiencing any other symptoms? She did have a cough but that has gotten better, but the SOB has said, her PCP suggested she give [place] a call. She has no fever. No other symptoms.

## 2018-04-19 ENCOUNTER — Telehealth: Payer: Self-pay | Admitting: Interventional Cardiology

## 2018-04-19 ENCOUNTER — Telehealth: Payer: Self-pay

## 2018-04-19 NOTE — Telephone Encounter (Signed)
Called and left a message asking her to keep all scheduled appts. Ask her to call the office with questions or concerns.

## 2018-04-19 NOTE — Telephone Encounter (Signed)
Attempted to call patient but there was no answer and VM did not pick up.

## 2018-04-19 NOTE — Telephone Encounter (Signed)
Follow Up:    Pt she is calling [place] to give her update on her condition. She said they have been talking back and forth.

## 2018-04-19 NOTE — Telephone Encounter (Signed)
She called and left a message to call her. She wonders if she should keep all of her appt's this week 3/19 lab, flush and CT and 3/20 Dr. Alvy Bimler and Infusion.  Called back and got busy signal.

## 2018-04-21 ENCOUNTER — Telehealth: Payer: Self-pay

## 2018-04-21 NOTE — Telephone Encounter (Signed)
She called and left a message to call her.  Called back. She is requesting lab and flush be moved closer to CT tomorrow. Scheduling message sent. She received a call today with reminder of appts. It mentioned shortness of breath, she always has SOB. Told her that they will screen her when she comes in tomorrow in registration. Since she always has the SOB she is okay as long as she has no fever and cough. She verbalized understanding.

## 2018-04-22 ENCOUNTER — Inpatient Hospital Stay: Payer: Medicare Other

## 2018-04-22 ENCOUNTER — Encounter (HOSPITAL_COMMUNITY): Payer: Self-pay

## 2018-04-22 ENCOUNTER — Inpatient Hospital Stay: Payer: Medicare Other | Attending: Hematology and Oncology

## 2018-04-22 ENCOUNTER — Other Ambulatory Visit: Payer: Self-pay

## 2018-04-22 ENCOUNTER — Ambulatory Visit (HOSPITAL_COMMUNITY)
Admission: RE | Admit: 2018-04-22 | Discharge: 2018-04-22 | Disposition: A | Discharge status: Home / Self Care | Payer: Medicare Other | Source: Ambulatory Visit | Attending: Hematology and Oncology | Admitting: Hematology and Oncology

## 2018-04-22 DIAGNOSIS — R918 Other nonspecific abnormal finding of lung field: Secondary | ICD-10-CM | POA: Diagnosis not present

## 2018-04-22 DIAGNOSIS — Z90710 Acquired absence of both cervix and uterus: Secondary | ICD-10-CM | POA: Diagnosis not present

## 2018-04-22 DIAGNOSIS — Z79899 Other long term (current) drug therapy: Secondary | ICD-10-CM | POA: Diagnosis not present

## 2018-04-22 DIAGNOSIS — Z7901 Long term (current) use of anticoagulants: Secondary | ICD-10-CM | POA: Diagnosis not present

## 2018-04-22 DIAGNOSIS — D3910 Neoplasm of uncertain behavior of unspecified ovary: Secondary | ICD-10-CM

## 2018-04-22 DIAGNOSIS — Z9221 Personal history of antineoplastic chemotherapy: Secondary | ICD-10-CM | POA: Diagnosis not present

## 2018-04-22 DIAGNOSIS — D61818 Other pancytopenia: Secondary | ICD-10-CM | POA: Insufficient documentation

## 2018-04-22 DIAGNOSIS — I517 Cardiomegaly: Secondary | ICD-10-CM | POA: Insufficient documentation

## 2018-04-22 DIAGNOSIS — K5730 Diverticulosis of large intestine without perforation or abscess without bleeding: Secondary | ICD-10-CM | POA: Insufficient documentation

## 2018-04-22 DIAGNOSIS — C569 Malignant neoplasm of unspecified ovary: Secondary | ICD-10-CM

## 2018-04-22 DIAGNOSIS — C784 Secondary malignant neoplasm of small intestine: Secondary | ICD-10-CM | POA: Insufficient documentation

## 2018-04-22 DIAGNOSIS — Z9049 Acquired absence of other specified parts of digestive tract: Secondary | ICD-10-CM | POA: Insufficient documentation

## 2018-04-22 DIAGNOSIS — I5040 Unspecified combined systolic (congestive) and diastolic (congestive) heart failure: Secondary | ICD-10-CM | POA: Insufficient documentation

## 2018-04-22 DIAGNOSIS — Z8543 Personal history of malignant neoplasm of ovary: Secondary | ICD-10-CM | POA: Diagnosis not present

## 2018-04-22 LAB — CBC WITH DIFFERENTIAL/PLATELET
Abs Immature Granulocytes: 0 10*3/uL (ref 0.00–0.07)
Basophils Absolute: 0 10*3/uL (ref 0.0–0.1)
Basophils Relative: 1 %
EOS ABS: 0.1 10*3/uL (ref 0.0–0.5)
Eosinophils Relative: 2 %
HCT: 35.7 % — ABNORMAL LOW (ref 36.0–46.0)
Hemoglobin: 11.6 g/dL — ABNORMAL LOW (ref 12.0–15.0)
Immature Granulocytes: 0 %
LYMPHS ABS: 1 10*3/uL (ref 0.7–4.0)
Lymphocytes Relative: 27 %
MCH: 36 pg — ABNORMAL HIGH (ref 26.0–34.0)
MCHC: 32.5 g/dL (ref 30.0–36.0)
MCV: 110.9 fL — ABNORMAL HIGH (ref 80.0–100.0)
Monocytes Absolute: 0.5 10*3/uL (ref 0.1–1.0)
Monocytes Relative: 14 %
NRBC: 0 % (ref 0.0–0.2)
Neutro Abs: 2.1 10*3/uL (ref 1.7–7.7)
Neutrophils Relative %: 56 %
Platelets: 152 10*3/uL (ref 150–400)
RBC: 3.22 MIL/uL — ABNORMAL LOW (ref 3.87–5.11)
RDW: 14.9 % (ref 11.5–15.5)
WBC: 3.6 10*3/uL — ABNORMAL LOW (ref 4.0–10.5)

## 2018-04-22 LAB — COMPREHENSIVE METABOLIC PANEL
ALT: 19 U/L (ref 0–44)
AST: 28 U/L (ref 15–41)
Albumin: 3.3 g/dL — ABNORMAL LOW (ref 3.5–5.0)
Alkaline Phosphatase: 128 U/L — ABNORMAL HIGH (ref 38–126)
Anion gap: 10 (ref 5–15)
BUN: 19 mg/dL (ref 8–23)
CO2: 32 mmol/L (ref 22–32)
Calcium: 9.3 mg/dL (ref 8.9–10.3)
Chloride: 101 mmol/L (ref 98–111)
Creatinine, Ser: 1 mg/dL (ref 0.44–1.00)
GFR calc Af Amer: 59 mL/min — ABNORMAL LOW (ref >60)
GFR calc non Af Amer: 51 mL/min — ABNORMAL LOW (ref >60)
Glucose, Bld: 89 mg/dL (ref 70–99)
Potassium: 4 mmol/L (ref 3.5–5.1)
Sodium: 143 mmol/L (ref 135–145)
Total Bilirubin: 0.7 mg/dL (ref 0.3–1.2)
Total Protein: 7.1 g/dL (ref 6.5–8.1)

## 2018-04-22 MED ORDER — HEPARIN SOD (PORK) LOCK FLUSH 100 UNIT/ML IV SOLN
INTRAVENOUS | Status: AC
  Filled 2018-04-22: qty 5

## 2018-04-22 MED ORDER — SODIUM CHLORIDE 0.9% FLUSH
10.0000 mL | Freq: Once | INTRAVENOUS | Status: AC
  Administered 2018-04-22: 10 mL
  Filled 2018-04-22: qty 10

## 2018-04-22 MED ORDER — SODIUM CHLORIDE (PF) 0.9 % IJ SOLN
INTRAMUSCULAR | Status: AC
  Filled 2018-04-22: qty 50

## 2018-04-22 MED ORDER — HEPARIN SOD (PORK) LOCK FLUSH 100 UNIT/ML IV SOLN
500.0000 [IU] | Freq: Once | INTRAVENOUS | Status: AC
  Administered 2018-04-22: 500 [IU] via INTRAVENOUS

## 2018-04-22 MED ORDER — IOHEXOL 300 MG/ML  SOLN
100.0000 mL | Freq: Once | INTRAMUSCULAR | Status: AC | PRN
  Administered 2018-04-22: 100 mL via INTRAVENOUS

## 2018-04-23 ENCOUNTER — Encounter: Payer: Self-pay | Admitting: Hematology and Oncology

## 2018-04-23 ENCOUNTER — Telehealth: Payer: Self-pay | Admitting: Oncology

## 2018-04-23 ENCOUNTER — Inpatient Hospital Stay: Payer: Medicare Other

## 2018-04-23 ENCOUNTER — Inpatient Hospital Stay (HOSPITAL_BASED_OUTPATIENT_CLINIC_OR_DEPARTMENT_OTHER): Payer: Medicare Other | Admitting: Hematology and Oncology

## 2018-04-23 ENCOUNTER — Other Ambulatory Visit: Payer: Self-pay

## 2018-04-23 ENCOUNTER — Other Ambulatory Visit: Payer: Self-pay | Admitting: Hematology and Oncology

## 2018-04-23 DIAGNOSIS — Z9049 Acquired absence of other specified parts of digestive tract: Secondary | ICD-10-CM | POA: Diagnosis not present

## 2018-04-23 DIAGNOSIS — I5040 Unspecified combined systolic (congestive) and diastolic (congestive) heart failure: Secondary | ICD-10-CM

## 2018-04-23 DIAGNOSIS — Z90710 Acquired absence of both cervix and uterus: Secondary | ICD-10-CM | POA: Diagnosis not present

## 2018-04-23 DIAGNOSIS — Z79899 Other long term (current) drug therapy: Secondary | ICD-10-CM

## 2018-04-23 DIAGNOSIS — Z9221 Personal history of antineoplastic chemotherapy: Secondary | ICD-10-CM

## 2018-04-23 DIAGNOSIS — D3910 Neoplasm of uncertain behavior of unspecified ovary: Secondary | ICD-10-CM

## 2018-04-23 DIAGNOSIS — C569 Malignant neoplasm of unspecified ovary: Secondary | ICD-10-CM | POA: Diagnosis not present

## 2018-04-23 DIAGNOSIS — C784 Secondary malignant neoplasm of small intestine: Secondary | ICD-10-CM | POA: Diagnosis not present

## 2018-04-23 DIAGNOSIS — Z7901 Long term (current) use of anticoagulants: Secondary | ICD-10-CM | POA: Diagnosis not present

## 2018-04-23 DIAGNOSIS — D61818 Other pancytopenia: Secondary | ICD-10-CM

## 2018-04-23 NOTE — Progress Notes (Signed)
[facility] OFFICE PROGRESS NOTE  Patient Care Team: [name], [name], [name], [name], [name], MD as Consulting Physician (Cardiology)  ASSESSMENT & PLAN:  Granulosa cell tumor of ovary I have reviewed multiple imaging studies with the patient She has dramatic improvement of disease burden since last year Even though she has minimal residual disease, she is not symptomatic She has received 7 doses of single agent carboplatin and tolerated that well except for pancytopenia I recommend discontinuation of treatment We discussed options include surveillance only, surgery or antiestrogen therapy Some of the expected risk, benefits, side effects of antiestrogen therapy were discussed with the patient and she will think about it I have made an appointment for her to get her port flushed in approximately 6 weeks and follow-up with GYN oncologist for further discussion about plan of care In the meantime, if she have signs or symptoms of abdominal discomfort, nausea or changes in bowel habits, she will contact me and we will see her back sooner.  Pancytopenia, acquired ([place]) This is due to recent side effects of treatment She is not symptomatic Observe only  Combined systolic and diastolic heart failure, NYHA class 3 ([place]) Continue medical management under the care of her cardiologist.  She has no clear-cut signs or symptoms of congestive heart failure today. She will continue anticoagulation therapy as directed   No orders of the defined types were placed in this encounter.   INTERVAL HISTORY: Please see below for problem oriented charting. She returns for further follow-up and to review imaging study With her last dose of chemotherapy, she denies side effects such as  nausea, worsening neuropathy or changes in bowel habits She has no recent bleeding complication, cough, chest pain or shortness of breath.  SUMMARY OF ONCOLOGIC HISTORY:   Granulosa cell tumor of ovary   02/19/1998 Initial Diagnosis    The granulosa cell tumor was initially diagnosed at [facility] in 2000, with recurrences in 2004, 2007 and spring 2011. The original operative note from 2000 and that pathology did not transfer into present EMR and I do not know laterality of the original tumor. The surgery in 2004 included resection of the tumor with small bowel resection and colectomy for diverticulosis. She had laparoscopic repair of ventral hernia in 2005. In 2007 she had resection of pelvic recurrence of tumor plus open repair of ventral hernia with mesh. Most recent intervention was laparoscopic resection of suprapubic involvement by Dr.Hoxworth in May 2011; she had a small and asymptomatic suprapubic hernia in 2011 which has been followed. All of her disease has been very indolent and has generally been detected by CT scans. Last CT AP was April 2013; [name], self-limited episode of bowel obstruction in May 2012.    08/24/2002 Pathology Results    SOFT TISSUE MASS, [name], [name], there are tumor cells present which appear histologically similar to those noted in the patient's previous granulosa cell tumor ([id number]) and therefore, consistent with recurrent/metastatic granulosa cell tumor. The findings correlate with the cytology material    12/06/2002 Pathology Results    1. SMALL INTESTINE, SEGMENTAL RESECTION: METASTATIC GRANULOSA CELL TUMOR.  2. SUPRAPUBIC TUMOR  NODULE: METASTATIC GRANULOSA CELL TUMOR.  3. COLON, SIGMOID, SEGMENTAL RESECTION: DIVERTICULOSIS WITH FOCAL MILD CHRONIC DIVERTICULITIS. SIX  BENIGN LYMPH NODES. NO TUMOR IDENTIFIED.    05/12/2005 PET scan    Again identified are enlarged lymph nodes within the right posterior cervical region and left external iliac region, [name], especially when compared with the examination dated 10/30/04.    10/01/2005 Pathology Results    1. ABDOMINAL WALL MASS: METASTATIC GRANULOSA CELL TUMOR.  2. LEFT ILIAC MASS: METASTATIC GRANULOSA CELL TUMOR.  COMMENT Both specimens are involved by metastatic tumor with features consistent with metastasis from the previously diagnosed adult granulosa cell tumor.     06/11/2007 Imaging    Stable CT the pelvis - no evidence of metastatic disease.    06/09/2008 Imaging    1.  Interval enlargement of small left perivesical nodule measuring 18 mm maximally.  This may reflect a lymph node but has a nonspecific appearance.  Attention to this area followup is recommended. 2.  Otherwise stable postsurgical appearance of the pelvis.    12/14/2008 Imaging    1.  Mild increase in size of low attenuation nodule in the left anterior pelvis indenting the urinary bladder, which now measures 2.8 cm compared with 1.8 cm on prior study.  Metastatic disease cannot be excluded. 2.  No other significant changes identified.  Stable diverticulosis and small suprapubic anterior abdominal wall hernia containing a loop of small bowel.    05/23/2009 Pathology Results    1. SOFT TISSUE MASS, SIMPLE EXCISION, : INVOLVEMENT BY NEOPLASM CONSISTENT WITH METASTATIC GRANULOSA CELL TUMOR.    06/08/2010 Imaging    1.  No acute findings. 2.  Interval progression of mild diffuse left renal atrophy and hypoperfusion, consistent with left renal artery stenosis. 3.  Stable small hiatal hernia, tiny periumbilical  hernia, and small suprapubic hernia containing a loop of small bowel.  No evidence of bowel ischemia or obstruction. 4.  Diverticulosis.  No radiographic evidence of diverticulitis.    05/22/2011 Imaging    1.  Stable exam.  No new or progressive disease identified within the abdomen or pelvis. 2.  Stable small hiatal hernia and suprapubic ventral hernia. Stable left renal atrophy, likely secondary to renal artery stenosis.    11/28/2011 Imaging    1.  No findings to suggest recurrent or metastatic disease in the abdomen or pelvis on today's examination. 2.  Indeterminate liver lesion in the right lobe of the liver is unchanged compared to numerous remote prior examinations. 3. Small suprapubic ventral hernia again noted. 4.  Extensive atherosclerosis including at least three vessel coronary artery disease.  5.  Postoperative changes, as above.     12/01/2016 Tumor Marker    Patient's tumor was tested for the following markers: Inhibin B Results of the tumor marker test revealed 119.2    12/11/2016 Imaging    Multiple new soft tissue masses in left pelvis and central small bowel mesentery, consistent with recurrent/metastatic carcinoma.     03/25/2017 Imaging    1. Omental and mesenteric tumor implants throughout the abdomen and pelvis with mixed findings when compared the prior study. Some of these lesions are clearly smaller and others are clearly larger. No new lesions are identified. 2. Stable severe atherosclerotic calcifications. 3. Stable low  anterior abdominal wall hernia. 4. Stable low-attenuation lesion involving the right hepatic lobe with adjacent calcifications or surgical clips.    10/12/2017 Imaging    Extraperitoneal fluid/hemorrhage related to the patient's dominant left pelvic tumor, as described above.  Progression of the patient's metastatic ovarian cancer, as described above. Dominant cystic/solid mass in the left adnexa/lower pelvis has increased in size.  Peritoneal disease/omental caking is mildly progressive. New pulmonary nodule at the right lung base, indeterminate.     10/12/2017 - 10/17/2017 Hospital Admission    She was admitted for retroperitoneal bleed.    10/16/2017 Imaging    1. Improving peritoneal hemorrhage. 2. No significant change in cystic and solid left pelvic mass and smaller anterior peritoneal cystic and solid mass compatible with tumor implant. 3. No evidence of metastatic disease elsewhere in the abdomen or pelvis. 4. Cardiomegaly. 5.  Calcific coronary artery and aortic atherosclerosis.  Aortic Atherosclerosis (ICD10-I70.0).    10/27/2017 Tumor Marker    Patient's tumor was tested for the following markers: Inhibin B Results of the tumor marker test revealed 98.4    10/28/2017 Procedure    Status post right IJ port catheter placement.    10/29/2017 - 03/30/2018 Chemotherapy    The patient had carboplatin x 7 doses    11/24/2017 Tumor Marker    Patient's tumor was tested for the following markers: Inhibin B Results of the tumor marker test revealed 113.6    01/04/2018 Imaging    1. The left adnexal mass appears mildly decreased in size from previous exam. Associated hemorrhage with left lower abdominal hematomas have resolved in the interval. Low-density central mesenteric lesion is also slightly decreased in size in the interval. The solid and cystic lesion along the undersurface of the ventral abdominal wall is mildly increased in size in the interval. Other peritoneal nodules are either stable or decreased in the interval. 2. Right lower lobe pulmonary nodule is unchanged. 3. No new sites of disease identified. 4. Aortic Atherosclerosis (ICD10-I70.0).    03/08/2018 Tumor Marker    Patient's tumor was tested for the following markers: Inhibin B Results of the tumor marker test revealed 135.5    04/01/2018 Tumor Marker    Patient's tumor was tested for the following markers: Inhibin B Results of the tumor marker  test revealed 168.9    04/22/2018 Imaging    1. Multiple intraperitoneal implants, overall similar in size, number and distribution to prior study from 01/04/2018. Likewise, the right lower lobe pulmonary nodules also stable. No definite progression of disease on today's examination. 2. Cardiomegaly with left ventricular and left atrial dilatation. 3. Colonic diverticulosis without evidence of acute diverticulitis at this time.     Ovarian cancer ([place])   10/19/2017 Initial Diagnosis    Ovarian cancer ([place])     REVIEW OF SYSTEMS:   Constitutional: Denies fevers, chills or abnormal weight loss Eyes: Denies blurriness of vision Ears, nose, mouth, throat, and face: Denies mucositis or sore throat Respiratory: Denies cough, dyspnea or wheezes Cardiovascular: Denies palpitation, chest discomfort or lower extremity swelling Gastrointestinal:  Denies nausea, heartburn or change in bowel habits Skin: Denies abnormal skin rashes Lymphatics: Denies new lymphadenopathy or easy bruising Neurological:Denies numbness, tingling or new weaknesses Behavioral/Psych: Mood is stable, no new changes  All other systems were reviewed with the patient and are negative.  I have reviewed the past medical history, past surgical history, social history and family history with the patient and they are unchanged from previous note.  ALLERGIES:  is  allergic to bee venom; other; amiodarone; and prednisone.  MEDICATIONS:  Current Outpatient Medications  Medication Sig Dispense Refill  . acetaminophen (TYLENOL) 500 MG tablet Take 500 mg by mouth at bedtime as needed for mild pain.     Marland Kitchen atorvastatin (LIPITOR) 80 MG tablet TAKE 1 TABLET BY MOUTH EVERY DAY IN THE MORNING 90 tablet 3  . beta carotene w/minerals (OCUVITE) tablet Take 1 tablet by mouth 2 (two) times daily.     . Calcium Carb-Cholecalciferol (CALTRATE 600+D) 600-800 MG-UNIT TABS Take 1 tablet by mouth every evening.     . clopidogrel (PLAVIX) 75 MG  tablet Take 1 tablet (75 mg total) by mouth daily. 90 tablet 3  . Coenzyme Q-10 100 MG capsule Take 100-200 mg by mouth daily.     Marland Kitchen diltiazem (CARDIZEM CD) 120 MG 24 hr capsule TAKE ONE CAPSULE BY MOUTH AT BEDTIME (Patient taking differently: Take 120 mg by mouth at bedtime. ) 90 capsule 3  . diltiazem (CARDIZEM CD) 240 MG 24 hr capsule TAKE ONE CAPSULE BY MOUTH DAILY WITH BREAKFAST (Patient taking differently: Take 240 mg by mouth daily. ) 30 capsule 11  . docusate sodium (COLACE) 100 MG capsule Take 100 mg by mouth 2 (two) times daily.    . furosemide (LASIX) 40 MG tablet Take 2 tablets (80 mg total) by mouth daily. 60 tablet 11  . gabapentin (NEURONTIN) 300 MG capsule Take 1 capsule (300 mg total) by mouth 2 (two) times daily. 180 capsule 2  . isosorbide mononitrate (IMDUR) 60 MG 24 hr tablet Take 1 tablet (60 mg total) by mouth daily. 90 tablet 3  . KLOR-CON M20 20 MEQ tablet TAKE 1 TABLET BY MOUTH EVERY DAY (Patient taking differently: Take 20 mEq by mouth at bedtime. ) 90 tablet 3  . lidocaine-prilocaine (EMLA) cream Apply 1 application topically as needed. (Patient taking differently: Apply 1 application topically as needed (port access). ) 30 g 6  . metoprolol tartrate (LOPRESSOR) 100 MG tablet Take 1 tablet (100 mg total) by mouth 2 (two) times daily. 60 tablet 9  . Multiple Vitamin (MULTIVITAMIN) capsule Take 1 capsule by mouth daily.    . nitroGLYCERIN (NITROSTAT) 0.4 MG SL tablet PLACE 1 TABLET (0.4 MG TOTAL) UNDER THE TONGUE EVERY 5 (FIVE) MINUTES AS NEEDED. FOR CHEST PAIN 25 tablet 6  . NON FORMULARY Place 1 each into the nose at bedtime. CPAP setting 3.5    . Omega-3 Fatty Acids (FISH OIL) 1200 MG CAPS Take 1,200 mg by mouth daily.     . ondansetron (ZOFRAN) 8 MG tablet Take 1 tablet (8 mg total) by mouth every 8 (eight) hours as needed for nausea. 30 tablet 3  . prochlorperazine (COMPAZINE) 10 MG tablet Take 1 tablet (10 mg total) by mouth every 6 (six) hours as needed for nausea or  vomiting. 30 tablet 0  . spironolactone (ALDACTONE) 25 MG tablet Take 25 mg by mouth at bedtime as needed (swelling in ankles).     . warfarin (COUMADIN) 2 MG tablet TAKE 1 TABLET DAILY EXCEPT 1/2 TABLET ON MONDAYS AND FRIDAYS OR AS DIRECTED BY COUMADIN CLINIC. 30 tablet 2   No current facility-administered medications for this visit.     PHYSICAL EXAMINATION: ECOG PERFORMANCE STATUS: 2 - Symptomatic, <50% confined to bed  Vitals:   04/23/18 1252  BP: 139/69  Pulse: (!) 59  Resp: 17  Temp: 98.1 F (36.7 C)  SpO2: 97%   Filed Weights   04/23/18 1252  Weight: 170  lb 6.4 oz (77.3 kg)    GENERAL:alert, no distress and comfortable Musculoskeletal:no cyanosis of digits and no clubbing  NEURO: alert & oriented x 3 with fluent speech, no focal motor/sensory deficits  LABORATORY DATA:  I have reviewed the data as listed    Component Value Date/Time   NA 143 04/22/2018 1151   NA 142 12/14/2014 1324   K 4.0 04/22/2018 1151   K 4.2 12/14/2014 1324   CL 101 04/22/2018 1151   CL 105 05/26/2012 1111   CO2 32 04/22/2018 1151   CO2 30 (H) 12/14/2014 1324   GLUCOSE 89 04/22/2018 1151   GLUCOSE 130 12/14/2014 1324   GLUCOSE 89 05/26/2012 1111   BUN 19 04/22/2018 1151   BUN 19.6 12/01/2016 1116   CREATININE 1.00 04/22/2018 1151   CREATININE 0.86 12/17/2017 0828   CREATININE 0.9 12/01/2016 1116   CALCIUM 9.3 04/22/2018 1151   CALCIUM 9.1 12/14/2014 1324   PROT 7.1 04/22/2018 1151   PROT 6.5 10/08/2017 0905   PROT 6.5 12/14/2014 1324   ALBUMIN 3.3 (L) 04/22/2018 1151   ALBUMIN 3.9 10/08/2017 0905   ALBUMIN 3.4 (L) 12/14/2014 1324   AST 28 04/22/2018 1151   AST 25 12/17/2017 0828   AST 29 12/14/2014 1324   ALT 19 04/22/2018 1151   ALT 26 12/17/2017 0828   ALT 20 12/14/2014 1324   ALKPHOS 128 (H) 04/22/2018 1151   ALKPHOS 103 12/14/2014 1324   BILITOT 0.7 04/22/2018 1151   BILITOT 0.5 12/17/2017 0828   BILITOT 0.77 12/14/2014 1324   GFRNONAA 51 (L) 04/22/2018 1151    GFRNONAA 60 (L) 12/17/2017 0828   GFRAA 59 (L) 04/22/2018 1151   GFRAA >60 12/17/2017 0828    No results found for: SPEP, UPEP  Lab Results  Component Value Date   WBC 3.6 (L) 04/22/2018   NEUTROABS 2.1 04/22/2018   HGB 11.6 (L) 04/22/2018   HCT 35.7 (L) 04/22/2018   MCV 110.9 (H) 04/22/2018   PLT 152 04/22/2018      Chemistry      Component Value Date/Time   NA 143 04/22/2018 1151   NA 142 12/14/2014 1324   K 4.0 04/22/2018 1151   K 4.2 12/14/2014 1324   CL 101 04/22/2018 1151   CL 105 05/26/2012 1111   CO2 32 04/22/2018 1151   CO2 30 (H) 12/14/2014 1324   BUN 19 04/22/2018 1151   BUN 19.6 12/01/2016 1116   CREATININE 1.00 04/22/2018 1151   CREATININE 0.86 12/17/2017 0828   CREATININE 0.9 12/01/2016 1116      Component Value Date/Time   CALCIUM 9.3 04/22/2018 1151   CALCIUM 9.1 12/14/2014 1324   ALKPHOS 128 (H) 04/22/2018 1151   ALKPHOS 103 12/14/2014 1324   AST 28 04/22/2018 1151   AST 25 12/17/2017 0828   AST 29 12/14/2014 1324   ALT 19 04/22/2018 1151   ALT 26 12/17/2017 0828   ALT 20 12/14/2014 1324   BILITOT 0.7 04/22/2018 1151   BILITOT 0.5 12/17/2017 0828   BILITOT 0.77 12/14/2014 1324       RADIOGRAPHIC STUDIES: I have reviewed multiple imaging studies with the patient I have personally reviewed the radiological images as listed and agreed with the findings in the report. Ct Abdomen Pelvis W Contrast  Result Date: 04/22/2018 CLINICAL DATA:  85 year old female with history of granulosa cell cancer diagnosed in 2000. Chemotherapy in progress. EXAM: CT ABDOMEN AND PELVIS WITH CONTRAST TECHNIQUE: Multidetector CT imaging of the abdomen and pelvis was  performed using the standard protocol following bolus administration of intravenous contrast. CONTRAST:  172mL OMNIPAQUE IOHEXOL 300 MG/ML  SOLN COMPARISON:  CT the abdomen and pelvis 01/04/2018. FINDINGS: Lower chest: 1 cm pulmonary nodule in the anterior aspect of the right lower lobe (axial image 11 of  series 4), stable compared to the prior study. Cardiomegaly with left ventricular and left atrial dilatation. Atherosclerotic calcifications in the descending thoracic aorta as well as the left anterior descending, left circumflex and right coronary arteries. Pacemaker lead terminating in the right ventricular apex. Small hiatal hernia. Hepatobiliary: Chronic mixed low-attenuation and calcified lesion in the right lobe of the liver, stable over several prior examinations dating back many years, considered benign. No other suspicious cystic or solid hepatic lesions. No intra or extrahepatic biliary ductal dilatation. Status post cholecystectomy. Pancreas: No pancreatic mass. No pancreatic ductal dilatation. No pancreatic or peripancreatic fluid or inflammatory changes. Spleen: Unremarkable. Adrenals/Urinary Tract: Atrophy of the left kidney, similar to prior studies. Multifocal cortical thinning in the right kidney also similar to prior examinations. No suspicious renal lesions. No hydroureteronephrosis. Urinary bladder is normal in appearance. Bilateral adrenal glands are normal in appearance. Stomach/Bowel: Normal appearance of the stomach. No pathologic dilatation of small bowel or colon. Numerous colonic diverticulae are noted, particularly in the sigmoid colon, without surrounding inflammatory changes to suggest an acute diverticulitis at this time. Normal appendix. Vascular/Lymphatic: Aortic atherosclerosis, without evidence of aneurysm or dissection in the abdominal or pelvic vasculature. No lymphadenopathy noted in the abdomen or pelvis. Reproductive: Status post hysterectomy. Ovaries are not confidently identified may be surgically absent or atrophic. Other: As noted on the prior examination, there are multiple soft tissue implants throughout the peritoneal cavity. The largest of these is in the left lower quadrant along the left pelvic sidewall immediately anteromedial to the left external iliac artery and  vein (axial image 64 of series 2) measuring approximately 5.2 x 4.6 cm (previously 6.1 x 4.0 cm on 01/04/2018) demonstrating both significant enhancing and internal cystic components. A mixed solid and cystic lesion in the anterior aspect of the pelvis (axial image 54 of series 2) immediately deep to the anterior abdominal wall, currently measuring 3.7 x 2.4 cm (previously 3.8 x 2.0 cm). Another predominantly low-attenuation lesion in the central mesentery (axial image 45 of series 2) measuring 3.5 x 3.1 cm is stable (previously 3.4 x 3.2 cm). Several smaller lesions are also noted. No significant volume of ascites. No pneumoperitoneum. Surgical clips along the pelvic sidewall bilaterally, presumably from prior lymph node dissection. Musculoskeletal: Postoperative changes of mesh repair for infraumbilical ventral hernia. There are no aggressive appearing lytic or blastic lesions noted in the visualized portions of the skeleton. IMPRESSION: 1. Multiple intraperitoneal implants, overall similar in size, number and distribution to prior study from 01/04/2018. Likewise, the right lower lobe pulmonary nodules also stable. No definite progression of disease on today's examination. 2. Cardiomegaly with left ventricular and left atrial dilatation. 3. Colonic diverticulosis without evidence of acute diverticulitis at this time. Electronically Signed   By: Vinnie Langton M.D.   On: 04/22/2018 16:37    All questions were answered. The patient knows to call the clinic with any problems, questions or concerns. No barriers to learning was detected.  I spent 25 minutes counseling the patient face to face. The total time spent in the appointment was 30 minutes and more than 50% was on counseling and review of test results  Heath Lark, MD 04/23/2018 3:50 PM

## 2018-04-23 NOTE — Assessment & Plan Note (Signed)
Continue medical management under the care of her cardiologist.  She has no clear-cut signs or symptoms of congestive heart failure today. She will continue anticoagulation therapy as directed

## 2018-04-23 NOTE — Assessment & Plan Note (Signed)
This is due to recent side effects of treatment She is not symptomatic Observe only

## 2018-04-23 NOTE — Assessment & Plan Note (Signed)
I have reviewed multiple imaging studies with the patient She has dramatic improvement of disease burden since last year Even though she has minimal residual disease, she is not symptomatic She has received 7 doses of single agent carboplatin and tolerated that well except for pancytopenia I recommend discontinuation of treatment We discussed options include surveillance only, surgery or antiestrogen therapy Some of the expected risk, benefits, side effects of antiestrogen therapy were discussed with the patient and she will think about it I have made an appointment for her to get her port flushed in approximately 6 weeks and follow-up with GYN oncologist for further discussion about plan of care In the meantime, if she have signs or symptoms of abdominal discomfort, nausea or changes in bowel habits, she will contact me and we will see her back sooner.

## 2018-04-23 NOTE — Telephone Encounter (Signed)
Left a message for [facility] with appointments for labs, port flush and follow up with Dr. Denman George on 06/04/18.

## 2018-04-26 ENCOUNTER — Other Ambulatory Visit: Payer: Self-pay

## 2018-04-26 MED ORDER — SPIRONOLACTONE 25 MG PO TABS: 25.0000 mg | ORAL_TABLET | Freq: Every evening | ORAL | 6 refills | Status: DC | PRN

## 2018-04-27 LAB — INHIBIN B: Inhibin B: 180 pg/mL — ABNORMAL HIGH (ref 0.0–16.9)

## 2018-05-01 NOTE — Telephone Encounter (Signed)
Called to follow up with patient. She states that she is doing well and her chemo has been successful. She denies any concerns at this time. She appreciated the call.

## 2018-05-05 ENCOUNTER — Ambulatory Visit: Payer: Medicare Other | Admitting: Podiatry

## 2018-05-05 ENCOUNTER — Telehealth: Payer: Self-pay

## 2018-05-05 NOTE — Telephone Encounter (Signed)
lmom for prescreen/drive thru 

## 2018-05-08 ENCOUNTER — Other Ambulatory Visit: Payer: Self-pay | Admitting: Interventional Cardiology

## 2018-05-10 ENCOUNTER — Other Ambulatory Visit: Payer: Self-pay

## 2018-05-10 ENCOUNTER — Ambulatory Visit (INDEPENDENT_AMBULATORY_CARE_PROVIDER_SITE_OTHER): Payer: Medicare Other | Admitting: *Deleted

## 2018-05-10 ENCOUNTER — Telehealth: Payer: Self-pay

## 2018-05-10 DIAGNOSIS — I495 Sick sinus syndrome: Secondary | ICD-10-CM | POA: Diagnosis not present

## 2018-05-10 LAB — CUP PACEART REMOTE DEVICE CHECK
Battery Remaining Longevity: 122 mo
Battery Remaining Percentage: 95.5 %
Battery Voltage: 3.01 V
Brady Statistic AP VP Percent: 4.3 %
Brady Statistic AP VS Percent: 44 %
Brady Statistic AS VP Percent: 48 %
Brady Statistic AS VS Percent: 2.8 %
Brady Statistic RA Percent Paced: 37 %
Brady Statistic RV Percent Paced: 48 %
Date Time Interrogation Session: 20200406085744
Implantable Lead Implant Date: 20181224
Implantable Lead Implant Date: 20181224
Implantable Lead Location: 753859
Implantable Lead Location: 753860
Implantable Pulse Generator Implant Date: 20181224
Lead Channel Impedance Value: 510 Ohm
Lead Channel Impedance Value: 730 Ohm
Lead Channel Pacing Threshold Amplitude: 0.75 V
Lead Channel Pacing Threshold Amplitude: 0.75 V
Lead Channel Pacing Threshold Pulse Width: 0.4 ms
Lead Channel Pacing Threshold Pulse Width: 0.4 ms
Lead Channel Sensing Intrinsic Amplitude: 3.1 mV
Lead Channel Sensing Intrinsic Amplitude: 6.3 mV
Lead Channel Setting Pacing Amplitude: 2 V
Lead Channel Setting Pacing Amplitude: 2.5 V
Lead Channel Setting Pacing Pulse Width: 0.4 ms
Lead Channel Setting Sensing Sensitivity: 2 mV
Pulse Gen Model: 2272
Pulse Gen Serial Number: 8968200

## 2018-05-10 NOTE — Telephone Encounter (Signed)
lmom for prescreen/drive thru 

## 2018-05-11 ENCOUNTER — Ambulatory Visit (INDEPENDENT_AMBULATORY_CARE_PROVIDER_SITE_OTHER): Payer: Medicare Other | Admitting: *Deleted

## 2018-05-11 ENCOUNTER — Other Ambulatory Visit: Payer: Self-pay

## 2018-05-11 DIAGNOSIS — I4891 Unspecified atrial fibrillation: Secondary | ICD-10-CM

## 2018-05-11 DIAGNOSIS — Z5181 Encounter for therapeutic drug level monitoring: Secondary | ICD-10-CM | POA: Diagnosis not present

## 2018-05-11 LAB — POCT INR: INR: 2.9 (ref 2.0–3.0)

## 2018-05-18 ENCOUNTER — Encounter: Payer: Self-pay | Admitting: Cardiology

## 2018-05-18 NOTE — Progress Notes (Signed)
Remote pacemaker transmission.   

## 2018-05-19 ENCOUNTER — Telehealth: Payer: Self-pay

## 2018-05-19 NOTE — Telephone Encounter (Signed)
She called and left message to call her.  Called her back. She is doing well and god bless Dr. Alvy Bimler for everything that she does. She is still taking gabapentin. Her neuropathy is better but has not gone away. Should she continue the gabapentin?

## 2018-05-20 NOTE — Telephone Encounter (Signed)
Yes, most neuropathy takes 3-6 months to improve

## 2018-05-20 NOTE — Telephone Encounter (Signed)
Called and given below message. She verbalized understanding. 

## 2018-05-24 DIAGNOSIS — H353112 Nonexudative age-related macular degeneration, right eye, intermediate dry stage: Secondary | ICD-10-CM | POA: Diagnosis not present

## 2018-05-24 DIAGNOSIS — H43811 Vitreous degeneration, right eye: Secondary | ICD-10-CM | POA: Diagnosis not present

## 2018-05-24 DIAGNOSIS — S41112A Laceration without foreign body of left upper arm, initial encounter: Secondary | ICD-10-CM | POA: Diagnosis not present

## 2018-05-24 DIAGNOSIS — Z961 Presence of intraocular lens: Secondary | ICD-10-CM | POA: Diagnosis not present

## 2018-05-24 DIAGNOSIS — H35363 Drusen (degenerative) of macula, bilateral: Secondary | ICD-10-CM | POA: Diagnosis not present

## 2018-05-25 ENCOUNTER — Telehealth: Payer: Self-pay

## 2018-05-25 NOTE — Telephone Encounter (Signed)

## 2018-05-27 ENCOUNTER — Ambulatory Visit (INDEPENDENT_AMBULATORY_CARE_PROVIDER_SITE_OTHER): Payer: Medicare Other | Admitting: *Deleted

## 2018-05-27 ENCOUNTER — Other Ambulatory Visit: Payer: Self-pay

## 2018-05-27 DIAGNOSIS — I4891 Unspecified atrial fibrillation: Secondary | ICD-10-CM

## 2018-05-27 DIAGNOSIS — Z5181 Encounter for therapeutic drug level monitoring: Secondary | ICD-10-CM | POA: Diagnosis not present

## 2018-05-27 LAB — POCT INR: INR: 3.8 — AB (ref 2.0–3.0)

## 2018-05-27 NOTE — Patient Instructions (Signed)
Description   Spoke with patient and instructed pt to hold today's dose then continue taking 1 tablet daily. Recheck INR in 2 weeks. Main [phone]. Coumadin Clinic # [phone]

## 2018-05-28 ENCOUNTER — Telehealth: Payer: Self-pay | Admitting: Interventional Cardiology

## 2018-05-28 DIAGNOSIS — S41112D Laceration without foreign body of left upper arm, subsequent encounter: Secondary | ICD-10-CM | POA: Diagnosis not present

## 2018-05-28 NOTE — Telephone Encounter (Signed)
Returned call to pt, pt wanted to remind [place] she was taking 2mg  daily which is a increase from her previous dosage of 2mg  daily except 1mg  on Mondays and Fridays. Advised pt her dosage was changed in February so she has been on 2mg  daily for a couple of months with INR's in range.  Pt states she is concerned has had internal bleeding in the past.  Explained rational for skipping and leaving on current dosage given INR's consistently around 2.0 on this dosage, and having pt increase vit K intake, which pt wants to do.  Pt agreed with plan and will return in 2 weeks for next INR check.  Advised pt, if at that time her INR is elevated we will most likely reduce her dosage of Warfarin at that time. Pt verbalized understanding and thanked [place] for her care.

## 2018-05-28 NOTE — Telephone Encounter (Signed)
New Message:   She said she talked to [facility] yesterday about her Coumadin. She says she thought of another question she needs to ask please.

## 2018-05-31 ENCOUNTER — Telehealth: Payer: Self-pay | Admitting: *Deleted

## 2018-05-31 NOTE — Telephone Encounter (Signed)
Tried to call the patient regarding her appt on Friday. Need to move her lab/flush appt to Wednesday or early Thursday. Also need to change her appt to a WebEx.

## 2018-06-01 NOTE — Telephone Encounter (Signed)
LM for patient to call Dr. Serita Grit office to schedule a [facility] for 06-04-18 appointment. And to get lab and port flush  Tomorrow Wednesday or Thursday.

## 2018-06-02 ENCOUNTER — Inpatient Hospital Stay: Payer: Medicare Other | Attending: Hematology and Oncology

## 2018-06-02 ENCOUNTER — Other Ambulatory Visit: Payer: Self-pay

## 2018-06-02 ENCOUNTER — Inpatient Hospital Stay: Payer: Medicare Other

## 2018-06-02 DIAGNOSIS — C569 Malignant neoplasm of unspecified ovary: Secondary | ICD-10-CM | POA: Insufficient documentation

## 2018-06-02 DIAGNOSIS — D3910 Neoplasm of uncertain behavior of unspecified ovary: Secondary | ICD-10-CM

## 2018-06-02 LAB — COMPREHENSIVE METABOLIC PANEL
ALT: 21 U/L (ref 0–44)
AST: 27 U/L (ref 15–41)
Albumin: 3.2 g/dL — ABNORMAL LOW (ref 3.5–5.0)
Alkaline Phosphatase: 115 U/L (ref 38–126)
Anion gap: 8 (ref 5–15)
BUN: 20 mg/dL (ref 8–23)
CO2: 34 mmol/L — ABNORMAL HIGH (ref 22–32)
Calcium: 8.5 mg/dL — ABNORMAL LOW (ref 8.9–10.3)
Chloride: 102 mmol/L (ref 98–111)
Creatinine, Ser: 1.03 mg/dL — ABNORMAL HIGH (ref 0.44–1.00)
GFR calc Af Amer: 57 mL/min — ABNORMAL LOW (ref >60)
GFR calc non Af Amer: 50 mL/min — ABNORMAL LOW (ref >60)
Glucose, Bld: 109 mg/dL — ABNORMAL HIGH (ref 70–99)
Potassium: 3.6 mmol/L (ref 3.5–5.1)
Sodium: 144 mmol/L (ref 135–145)
Total Bilirubin: 0.6 mg/dL (ref 0.3–1.2)
Total Protein: 6.9 g/dL (ref 6.5–8.1)

## 2018-06-02 LAB — CBC WITH DIFFERENTIAL/PLATELET
Abs Immature Granulocytes: 0.01 10*3/uL (ref 0.00–0.07)
Basophils Absolute: 0 10*3/uL (ref 0.0–0.1)
Basophils Relative: 0 %
Eosinophils Absolute: 0.2 10*3/uL (ref 0.0–0.5)
Eosinophils Relative: 4 %
HCT: 36.6 % (ref 36.0–46.0)
Hemoglobin: 11.7 g/dL — ABNORMAL LOW (ref 12.0–15.0)
Immature Granulocytes: 0 %
Lymphocytes Relative: 25 %
Lymphs Abs: 1.4 10*3/uL (ref 0.7–4.0)
MCH: 35 pg — ABNORMAL HIGH (ref 26.0–34.0)
MCHC: 32 g/dL (ref 30.0–36.0)
MCV: 109.6 fL — ABNORMAL HIGH (ref 80.0–100.0)
Monocytes Absolute: 0.8 10*3/uL (ref 0.1–1.0)
Monocytes Relative: 14 %
Neutro Abs: 3.1 10*3/uL (ref 1.7–7.7)
Neutrophils Relative %: 57 %
Platelets: 208 10*3/uL (ref 150–400)
RBC: 3.34 MIL/uL — ABNORMAL LOW (ref 3.87–5.11)
RDW: 14.3 % (ref 11.5–15.5)
WBC: 5.5 10*3/uL (ref 4.0–10.5)
nRBC: 0 % (ref 0.0–0.2)

## 2018-06-03 DIAGNOSIS — S41102D Unspecified open wound of left upper arm, subsequent encounter: Secondary | ICD-10-CM | POA: Diagnosis not present

## 2018-06-03 LAB — INHIBIN B: Inhibin B: 150.5 pg/mL — ABNORMAL HIGH (ref 0.0–16.9)

## 2018-06-04 ENCOUNTER — Inpatient Hospital Stay: Payer: Medicare Other | Attending: Hematology and Oncology | Admitting: Gynecologic Oncology

## 2018-06-04 ENCOUNTER — Other Ambulatory Visit: Payer: Medicare Other

## 2018-06-04 ENCOUNTER — Encounter: Payer: Self-pay | Admitting: Gynecologic Oncology

## 2018-06-04 DIAGNOSIS — R5383 Other fatigue: Secondary | ICD-10-CM

## 2018-06-04 DIAGNOSIS — R1909 Other intra-abdominal and pelvic swelling, mass and lump: Secondary | ICD-10-CM

## 2018-06-04 DIAGNOSIS — Z9221 Personal history of antineoplastic chemotherapy: Secondary | ICD-10-CM | POA: Diagnosis not present

## 2018-06-04 DIAGNOSIS — E440 Moderate protein-calorie malnutrition: Secondary | ICD-10-CM

## 2018-06-04 DIAGNOSIS — C569 Malignant neoplasm of unspecified ovary: Secondary | ICD-10-CM | POA: Diagnosis not present

## 2018-06-04 DIAGNOSIS — C786 Secondary malignant neoplasm of retroperitoneum and peritoneum: Secondary | ICD-10-CM | POA: Diagnosis not present

## 2018-06-04 DIAGNOSIS — R1900 Intra-abdominal and pelvic swelling, mass and lump, unspecified site: Secondary | ICD-10-CM

## 2018-06-04 DIAGNOSIS — R5381 Other malaise: Secondary | ICD-10-CM

## 2018-06-04 DIAGNOSIS — D3910 Neoplasm of uncertain behavior of unspecified ovary: Secondary | ICD-10-CM

## 2018-06-04 DIAGNOSIS — Z452 Encounter for adjustment and management of vascular access device: Secondary | ICD-10-CM | POA: Insufficient documentation

## 2018-06-04 MED ORDER — ANASTROZOLE 1 MG PO TABS: 1.0000 mg | ORAL_TABLET | Freq: Every day | ORAL | 6 refills | Status: DC

## 2018-06-04 NOTE — Progress Notes (Signed)
Gynecologic Oncology Telehealth Follow-up Note: Gyn-Onc  I connected with Patty [name] on 06/04/18 at  3:00 PM EDT by telephone and verified that I am speaking with the correct person using two identifiers.  I discussed the limitations, risks, security and privacy concerns of performing an evaluation and management service by telemedicine and the availability of in-person appointments. I also discussed with the patient that there may be a patient responsible charge related to this service. The patient expressed understanding and agreed to proceed.  Other persons participating in the visit and their role in the encounter: none.  Patient's location: home Provider's location: Patty [name] was requested by [name] for the evaluation of Patty [name] 85 y.o. female  CC:  Chief Complaint  Patient presents with  . Ovarian Cancer    recurrent granulosa cell tumor follow-up    Assessment/Plan:  Patty [name]  is a 85 y.o.  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 Stable, asymptomatic disease on imaging.  Recommend anti-estrogen therapy as maintenance. Recommend anastrazole 64m daily. Will re-evaluate in 3 months and if progressing or not tolerating, will consider changing to alternating progresterone and tamoxifen every 2 weeks.   I will see MShylerback in 3 months.   HPI: MMerita Hawksis an 85year old woman who is seen in consultation at the request of Dr GAlvy Bimlerfor granulosa cell ovarian cancer.   The patient's history began in approximately 2000 when she underwent ex lap, TAH, [name] She did not receive adjuvant chemotherapy. Postoperatively she had recurrences in the peritoneum in 2004, 2007 and 2011.  The 2004 surgery was with Dr HExcell Seltzerand included resection of the recurrent tumor with small bowel resection and  colectomy for diverticulosis. She has had a laparoscopic hernia repair with mesh in 2005. In 2007 she underwent resection of pelvic tumor recurrence with Dr HExcell Seltzerand ventral hernia repair with mesh.  In 2011 Dr HExcell Seltzerperformed a laparoscopic resection of suprapubic tumor. Her last CT was in 2010.   She has significant CAD with 6 stents and a history of CHF. She has a pacemaker which she states has a nonfunctioning battery and she is not currently paced. This was placed for tachycadia-bradycardia syndrome. She is obese with OSA. She has atrial fibrillation and is on coumadin.   In October, 2018 she began noting intermittent right lower quadrant pains. Labs at that time showed:  CA 125 12/01/16: 35 Inhibin B 11/21/16: 119  CT abd/pelvis 12/09/16: A small suprapubic hernia is seen along the inferior margin of the surgical mass containing a loop of small bowel. No evidence of bowel obstruction or ischemia. Multiple new soft tissue masses are seen in the left suprapubic region and left lower quadrant, consistent with recurrent tumor. Largest in the left suprapubic region measures 3.8 x 2.8 cm, and 3.8 x 3.4 cm in left lower quadrant. There is also a new mass in the central small bowel mesenteric measuring 5.8 x 5.0 cm. These findings are consistent with recurrent carcinoma and peritoneal metastases.  She was provided with 3 options for proceeding with treatment of her recurrence:  1/ surgical resection and hernia repair. This will involve exploratory laparotomy, radical tumor debulking, possible bowel resection, possible partial cystectomy, and hernia repair with Dr HExcell Seltzerand myself.  She will require medical clearance as she has significant cardiac issues.  2/ chemotherapy with carboplatin and paclitaxel. I discussed that this may not achieve as long a remission as surgical intervention, but would involve a large radical procedure.  3/ A third alternative was hormonal therapy with progestin  and tamoxifen in rotation.  She was not sure about which route to pursue, and given that she was asymptomatic with this slow growing tumor, took some time to reflect. In December, 2018 she received replacement of her pacemaker and was placed on Plavix and coumadin.  She has met with her cardiologists, Dr Patty [name] and Dr Patty [name], who felt that she was a candidate for any of the approaches, however, would prefer until she was 1 year out from her last coronary catheterization procedure to be taken off anticoagulants.  CT abd/pelvis in February, 2019 showed essentially stable disease with some mild increases in tumor size, and some decreased, but no new lesions. As she was asymptomatic she elected for expectant management at that time with plan for surgery after she was 1 year out from prior cardiac cath.  On 10/12/17 she was admitted for 5 days with intraperitoneal bleeding from her granulosa cell tumor masses. Her anticoagulant therapy was held as was her plavix and she was transfused. CT imaging on 10/16/17 showed the previously demonstrated 9.4 x 9.2 cm left anterior pelvic hemorrhage currently measures 9.5 x 7.3 cm and is less dense. More anteriorly located hemorrhage on the left previously measures 15.6 x 4.4 cm and currently measures 17.3 x 2.8 cm. A previously demonstrated 2.8 cm cystic and solid midline anterior peritoneal mass at the level of the upper pelvis measures 3.0 cm. The previously demonstrated 8.2 x 5.6 cm cystic and solid left pelvic mass currently measures 7.8 x 5.8 cm. There is less free peritoneal blood in the upper abdomen. Anterior hernia repair mesh is again demonstrated. A ventral hernia at the level of the inferior pelvis containing herniated small bowel without obstruction is again demonstrated. This also contains a small amount the urinary bladder.  Given the progression of her peritoneal disease on imaging, she was felt to be a better candidate for chemotherapy first with  carboplatin and paclitaxel, followed by consideration for interval debulking if she demonstrates a good response.  Interval Hx: 10/29/17-03/30/18 she received single agent carboplatin x 7 doses. She was admitted for heart failure after cycle 1.  She otherwise tolerated therapy well. Her Inhibin B tumor marker remained stable at approximately 150.  CT imaging showed response, and in March, 2020 showed progressive response: Multiple intraperitoneal implants, overall similar in size, number and distribution to prior study from 01/04/2018. Likewise, the right lower lobe pulmonary nodules also stable. No definite progression of disease on today's examination.  Inhibin B on 06/02/18: 150.   Current Meds:  Outpatient Encounter Medications as of 06/04/2018  Medication Sig  . acetaminophen (TYLENOL) 500 MG tablet Take 500 mg by mouth at bedtime as needed for mild pain.   Marland Kitchen atorvastatin (LIPITOR) 80 MG tablet TAKE 1 TABLET BY MOUTH EVERY DAY IN THE MORNING  . beta carotene w/minerals (OCUVITE) tablet Take 1 tablet by mouth 2 (two) times daily.   . Calcium Carb-Cholecalciferol (CALTRATE 600+D) 600-800 MG-UNIT TABS Take 1 tablet by mouth every evening.   . clopidogrel (PLAVIX) 75 MG tablet Take 1 tablet (75 mg total) by mouth daily.  . Coenzyme Q-10 100 MG capsule Take 100-200 mg by mouth daily.   Marland Kitchen diltiazem (CARDIZEM CD) 120 MG 24 hr capsule TAKE ONE CAPSULE BY MOUTH AT BEDTIME (Patient taking  differently: Take 120 mg by mouth at bedtime. )  . diltiazem (CARDIZEM CD) 240 MG 24 hr capsule TAKE ONE CAPSULE BY MOUTH DAILY WITH BREAKFAST (Patient taking differently: Take 240 mg by mouth daily. )  . docusate sodium (COLACE) 100 MG capsule Take 100 mg by mouth 2 (two) times daily.  . furosemide (LASIX) 40 MG tablet Take 2 tablets (80 mg total) by mouth daily.  Marland Kitchen gabapentin (NEURONTIN) 300 MG capsule Take 1 capsule (300 mg total) by mouth 2 (two) times daily.  . isosorbide mononitrate (IMDUR) 60 MG 24 hr  tablet Take 1 tablet (60 mg total) by mouth daily.  Marland Kitchen KLOR-CON M20 20 MEQ tablet TAKE 1 TABLET BY MOUTH EVERY DAY (Patient taking differently: Take 20 mEq by mouth at bedtime. )  . lidocaine-prilocaine (EMLA) cream Apply 1 application topically as needed. (Patient taking differently: Apply 1 application topically as needed (port access). )  . metoprolol tartrate (LOPRESSOR) 100 MG tablet Take 1 tablet (100 mg total) by mouth 2 (two) times daily.  . Multiple Vitamin (MULTIVITAMIN) capsule Take 1 capsule by mouth daily.  . nitroGLYCERIN (NITROSTAT) 0.4 MG SL tablet PLACE 1 TABLET (0.4 MG TOTAL) UNDER THE TONGUE EVERY 5 (FIVE) MINUTES AS NEEDED. FOR CHEST PAIN  . NON FORMULARY Place 1 each into the nose at bedtime. CPAP setting 3.5  . Omega-3 Fatty Acids (FISH OIL) 1200 MG CAPS Take 1,200 mg by mouth daily.   . ondansetron (ZOFRAN) 8 MG tablet Take 1 tablet (8 mg total) by mouth every 8 (eight) hours as needed for nausea.  . prochlorperazine (COMPAZINE) 10 MG tablet Take 1 tablet (10 mg total) by mouth every 6 (six) hours as needed for nausea or vomiting.  Marland Kitchen spironolactone (ALDACTONE) 25 MG tablet Take 1 tablet (25 mg total) by mouth at bedtime as needed (swelling in ankles).  . warfarin (COUMADIN) 2 MG tablet TAKE 1 TABLET DAILY EXCEPT 1/2 TABLET ON MONDAYS AND FRIDAYS OR AS DIRECTED BY COUMADIN CLINIC.   No facility-administered encounter medications on file as of 06/04/2018.     Allergy:  Allergies  Allergen Reactions  . Bee Venom Anaphylaxis  . Other Nausea And Vomiting and Other (See Comments)    Pain medications cause severe vomiting. Tolerated slow IV morphine drip  . Amiodarone Nausea Only  . Prednisone Other (See Comments)    "Rapid Heart Beat"    Social Hx:   Social History   Socioeconomic History  . Marital status: Widowed    Spouse name: Not on file  . Number of children: 4  . Years of education: Not on file  . Highest education level: Not on file  Occupational History  .  Occupation: Retired Nurse, learning disability estate  Social Needs  . Financial resource strain: Not on file  . Food insecurity:    Worry: Not on file    Inability: Not on file  . Transportation needs:    Medical: Not on file    Non-medical: Not on file  Tobacco Use  . Smoking status: Former Smoker    Packs/day: 1.00    Years: 32.00    Pack years: 32.00    Types: Cigarettes    Last attempt to quit: 02/03/1974    Years since quitting: 44.3  . Smokeless tobacco: Never Used  Substance and Sexual Activity  . Alcohol use: Yes    Alcohol/week: 5.0 standard drinks    Types: 5 Glasses of wine per week    Comment: occ  . Drug  use: No  . Sexual activity: Never  Lifestyle  . Physical activity:    Days per week: Not on file    Minutes per session: Not on file  . Stress: Not on file  Relationships  . Social connections:    Talks on phone: Not on file    Gets together: Not on file    Attends religious service: Not on file    Active member of club or organization: Not on file    Attends meetings of clubs or organizations: Not on file    Relationship status: Not on file  . Intimate partner violence:    Fear of current or ex partner: Not on file    Emotionally abused: Not on file    Physically abused: Not on file    Forced sexual activity: Not on file  Other Topics Concern  . Not on file  Social History Narrative   Lives with family.    Past Surgical Hx:  Past Surgical History:  Procedure Laterality Date  . ABDOMINAL HYSTERECTOMY    . CARDIAC CATHETERIZATION  09/03/2007   EF 70%; Failed attempt at PCI to OM  . CARDIAC CATHETERIZATION  11/01/2003   EF 70%  . CARDIAC CATHETERIZATION N/A 12/14/2015   Procedure: Left Heart Cath and Coronary Angiography;  Surgeon: Burnell Blanks, MD;  Location: [place] CV LAB;  Service: Cardiovascular;  Laterality: N/A;  . CARDIOVERSION  12/31/2011   Procedure: CARDIOVERSION;  Surgeon: [name], MD;  Location: [facility] ENDOSCOPY;  Service:  Cardiovascular;  Laterality: N/A;  . CARDIOVERSION N/A 12/31/2011   Procedure: CARDIOVERSION;  Surgeon: [name], MD;  Location: [facility] CATH LAB;  Service: Cardiovascular;  Laterality: N/A;  . CATARACT EXTRACTION, BILATERAL  2015  . CHOLECYSTECTOMY  1980's  . COLON SURGERY  2004   colectomy for diverticulosis  . CORONARY ANGIOPLASTY WITH STENT PLACEMENT  2000; 08/11/2012; 11/12/2012   3 + 2 LAD & CFX; 2nd CFX stent 11/12/2012  . CORONARY ATHERECTOMY N/A 07/15/2016   Procedure: Coronary Atherectomy;  Surgeon: Martinique, Peter M, MD;  Location: [place] CV LAB;  Service: Cardiovascular;  Laterality: N/A;  . CORONARY BALLOON ANGIOPLASTY N/A 07/14/2016   Procedure: Coronary Balloon Angioplasty;  Surgeon: Martinique, Peter M, MD;  Location: [place] CV LAB;  Service: Cardiovascular;  Laterality: N/A;  . CORONARY STENT INTERVENTION N/A 07/15/2016   Procedure: Coronary Stent Intervention;  Surgeon: Martinique, Peter M, MD;  Location: [place] CV LAB;  Service: Cardiovascular;  Laterality: N/A;  . FRACTIONAL FLOW RESERVE WIRE  10/07/2013   Procedure: [place];  Surgeon: [name], MD;  Location: [facility] CATH LAB;  Service: Cardiovascular;;  . granulosa tumor excision  2000; 2003; 2004; 2007   "all in my abdomen including small intestines, outside my ?uterus/etc" (08/11/2012)  . HERNIA REPAIR  2005   "laparoscopic"  . IR IMAGING GUIDED PORT INSERTION  10/28/2017  . LEFT HEART CATH AND CORONARY ANGIOGRAPHY N/A 07/14/2016   Procedure: Left Heart Cath and Coronary Angiography;  Surgeon: Martinique, Peter M, MD;  Location: [place] CV LAB;  Service: Cardiovascular;  Laterality: N/A;  . LEFT HEART CATHETERIZATION WITH CORONARY ANGIOGRAM N/A 11/12/2012   Procedure: LEFT HEART CATHETERIZATION WITH CORONARY ANGIOGRAM;  Surgeon: [name], MD;  Location: [facility] CATH LAB;  Service: Cardiovascular;  Laterality: N/A;  . LEFT HEART CATHETERIZATION WITH CORONARY ANGIOGRAM N/A 10/07/2013    Procedure: LEFT HEART CATHETERIZATION WITH CORONARY ANGIOGRAM;  Surgeon: [name], MD;  Location:  [place] CATH LAB;  Service: Cardiovascular;  Laterality: N/A;  . LEFT HEART CATHETERIZATION WITH CORONARY ANGIOGRAM N/A 12/14/2013   Procedure: LEFT HEART CATHETERIZATION WITH CORONARY ANGIOGRAM;  Surgeon: Sinclair Grooms, MD;  Location: [facility] CATH LAB;  Service: Cardiovascular;  Laterality: N/A;  . LEFT HEART CATHETERIZATION WITH CORONARY ANGIOGRAM N/A 05/16/2014   Procedure: LEFT HEART CATHETERIZATION WITH CORONARY ANGIOGRAM;  Surgeon: Sherren Mocha, MD;  Location: [facility] CATH LAB;  Service: Cardiovascular;  Laterality: N/A;  . PERCUTANEOUS CORONARY INTERVENTION-BALLOON ONLY  08/04/2012   Procedure: PERCUTANEOUS CORONARY INTERVENTION-BALLOON ONLY;  Surgeon: [name], MD;  Location: [facility] CATH LAB;  Service: Cardiovascular;;  . PERCUTANEOUS CORONARY ROTOBLATOR INTERVENTION (PCI-R) N/A 08/11/2012   Procedure: PERCUTANEOUS CORONARY ROTOBLATOR INTERVENTION (PCI-R);  Surgeon: [name], MD;  Location: [facility] CATH LAB;  Service: Cardiovascular;  Laterality: N/A;  . PERCUTANEOUS CORONARY STENT INTERVENTION (PCI-S)  10/07/2013   Procedure: PERCUTANEOUS CORONARY STENT INTERVENTION (PCI-S);  Surgeon: [name], MD;  Location: [facility] CATH LAB;  Service: Cardiovascular;;  . PERMANENT PACEMAKER INSERTION N/A 03/16/2012   [name] (SJM) leadless pacemaker (LEADLESS II STUDY PATEINT)  . SALIVARY GLAND SURGERY  2000's   "had a little lump removed; granulosa related; it was benign" (08/11/2012)  . TEE WITHOUT CARDIOVERSION  12/31/2011   Procedure: TRANSESOPHAGEAL ECHOCARDIOGRAM (TEE);  Surgeon: [name], MD;  Location: [place];  Service: Cardiovascular;  Laterality: N/A;  . VARICOSE VEIN SURGERY Bilateral 1977    Past Medical Hx:  Past Medical History:  Diagnosis Date  . Bleeding behind the abdominal cavity 10/2017  . Chronic anticoagulation - coumadin, CHADS2VASC=6 05/17/2015  .  CKD (chronic kidney disease), stage III ([place])   . Combined systolic and diastolic heart failure ([place])   . Coronary artery disease    a. s/p multiple stents - stenting x 2 to the LAD and x 1 to the LCx in 2000, rotational arthrectomy to proximal LCx and DES to LAD in 2014 and DES to LCx in 2014, along with DES to LAD for re-in-stent stenosis in 2015, and DES to ostial LAD in 2016. b. 07/2016 - orbital atherectomy & DES to mid LCx.  . Diverticulitis   . Granulosa cell carcinoma ([place])    abd; last episode was in 2009  . Hyperlipidemia   . Hypertension   . LBBB (left bundle branch block)       . Myocardial infarction ([place]) 2002  . Obesity   . OSA on CPAP   . Ovarian ca ([place]) 2019  . Pacemaker failure    a. Prior leadless PPM with premature battery failure, being managed conservatively without replacement.  . Peripheral vascular disease ([place])   . Permanent atrial fibrillation 2013  . Renal artery stenosis ([place])   . Tachycardia-bradycardia syndrome ([place])    a. s/p leadless pacemaker ([name]) implanted by Dr Patty [name]  . Varicose veins     Past Gynecological History:  Granulosa cell tumor of the ovary No LMP recorded. Patient has had a hysterectomy.  Family Hx:  Family History  Problem Relation Age of Onset  . Stroke Mother   . Heart attack Father   . Diabetes Father   . Hypertension Father   . Heart attack Brother   . Diabetes Brother   . Hypertension Brother   . Kidney failure Brother     Review of Systems:  Constitutional  Feels fatigued    ENT Normal appearing ears and nares bilaterally Skin/Breast  No rash, sores, jaundice, itching, dryness Cardiovascular  No  chest pain, shortness of breath, or edema  Pulmonary  No cough or wheeze.  Gastro Intestinal  No nausea, vomitting, or diarrhoea. No bright red blood per rectum, no abdominal pain, change in bowel movement, or constipation.  Genito Urinary  No frequency, urgency, dysuria, no pelvic pain Musculo Skeletal  No  myalgia, arthralgia, joint swelling or pain  Neurologic  No weakness, change in gait, + neuropathy Psychology  No depression, anxiety, insomnia.   Vitals:  There were no vitals taken for this visit.  Physical Exam: deferred due to phone encounter.  I discussed the assessment and treatment plan with the patient. The patient was provided with an opportunity to ask questions and all were answered. The patient agreed with the plan and demonstrated an understanding of the instructions.   The patient was advised to call back or see an in-person evaluation if the symptoms worsen or if the condition fails to improve as anticipated.   I provided 40 minutes of non face-to-face telephone visit time during this encounter, and > 50% was spent counseling as documented under my assessment & plan.  Thereasa Solo, MD  06/04/2018, 3:31 PM

## 2018-06-08 ENCOUNTER — Telehealth: Payer: Self-pay | Admitting: *Deleted

## 2018-06-08 ENCOUNTER — Telehealth: Payer: Self-pay

## 2018-06-08 NOTE — Telephone Encounter (Signed)
Called and spoke with the patient regarding her up coming appts. Gave the patient the flush appt for 5/14. Gave her the information for the appts in July. Patient stated " I would like to wait to start the anastrozole for a couple weeks. Nervous about the side effects and COVID, coming to the hospital. I just want to take a rest before keeping an eye on side effects. But if Dr. Denman George wants me to start now I will, but I like to start the middle June."

## 2018-06-08 NOTE — Telephone Encounter (Signed)

## 2018-06-10 ENCOUNTER — Other Ambulatory Visit: Payer: Self-pay

## 2018-06-10 ENCOUNTER — Ambulatory Visit (INDEPENDENT_AMBULATORY_CARE_PROVIDER_SITE_OTHER): Payer: Medicare Other | Admitting: Pharmacist

## 2018-06-10 DIAGNOSIS — Z5181 Encounter for therapeutic drug level monitoring: Secondary | ICD-10-CM | POA: Diagnosis not present

## 2018-06-10 DIAGNOSIS — I4891 Unspecified atrial fibrillation: Secondary | ICD-10-CM | POA: Diagnosis not present

## 2018-06-10 LAB — POCT INR: INR: 3.5 — AB (ref 2.0–3.0)

## 2018-06-17 ENCOUNTER — Inpatient Hospital Stay: Payer: Medicare Other

## 2018-06-17 ENCOUNTER — Other Ambulatory Visit: Payer: Self-pay

## 2018-06-17 DIAGNOSIS — C569 Malignant neoplasm of unspecified ovary: Secondary | ICD-10-CM | POA: Diagnosis not present

## 2018-06-17 DIAGNOSIS — D3910 Neoplasm of uncertain behavior of unspecified ovary: Secondary | ICD-10-CM

## 2018-06-17 DIAGNOSIS — Z452 Encounter for adjustment and management of vascular access device: Secondary | ICD-10-CM | POA: Diagnosis not present

## 2018-06-17 MED ORDER — SODIUM CHLORIDE 0.9% FLUSH
10.0000 mL | Freq: Once | INTRAVENOUS | Status: AC
  Administered 2018-06-17: 10 mL
  Filled 2018-06-17: qty 10

## 2018-06-17 MED ORDER — HEPARIN SOD (PORK) LOCK FLUSH 100 UNIT/ML IV SOLN
250.0000 [IU] | Freq: Once | INTRAVENOUS | Status: AC
  Administered 2018-06-17: 15:00:00 500 [IU]
  Filled 2018-06-17: qty 5

## 2018-06-23 ENCOUNTER — Telehealth: Payer: Self-pay

## 2018-06-23 NOTE — Telephone Encounter (Signed)
lmom for prescreen  

## 2018-06-23 NOTE — Telephone Encounter (Signed)
1. Do you currently have a fever? NO (yes = cancel and refer to pcp for e-visit) 2. Have you recently travelled on a cruise, internationally, or to [place], [place], [place], [place], [place], or [place], [place] [employer]) ? No (yes = cancel, stay home, monitor symptoms, and contact pcp or initiate e-visit if symptoms develop) 3. Have you been in contact with someone that is currently pending confirmation of Covid19 testing or has been confirmed to have the [place] virus?  No (yes = cancel, stay home, away from tested individual, monitor symptoms, and contact pcp or initiate e-visit if symptoms develop) 4. Are you currently experiencing fatigue or cough? No (yes = pt should be prepared to have a mask placed at the time of their visit).  Pt. Advised that we are restricting visitors at this time and anyone present in the vehicle should meet the above criteria as well. Advised that visit will be at curbside for finger stick ONLY and will receive call with instructions. Pt also advised to please bring own pen for signature of arrival document.

## 2018-06-24 ENCOUNTER — Ambulatory Visit (INDEPENDENT_AMBULATORY_CARE_PROVIDER_SITE_OTHER): Payer: Medicare Other | Admitting: Pharmacist

## 2018-06-24 ENCOUNTER — Other Ambulatory Visit: Payer: Self-pay

## 2018-06-24 DIAGNOSIS — I4891 Unspecified atrial fibrillation: Secondary | ICD-10-CM

## 2018-06-24 DIAGNOSIS — Z5181 Encounter for therapeutic drug level monitoring: Secondary | ICD-10-CM | POA: Diagnosis not present

## 2018-06-24 LAB — POCT INR: INR: 2.8 (ref 2.0–3.0)

## 2018-06-30 ENCOUNTER — Telehealth: Payer: Self-pay | Admitting: *Deleted

## 2018-06-30 DIAGNOSIS — G4733 Obstructive sleep apnea (adult) (pediatric): Secondary | ICD-10-CM | POA: Diagnosis not present

## 2018-06-30 NOTE — Telephone Encounter (Signed)
Told Patty [name] that Dr. Denman George said it is fine to increase her gabapentin to 300 mg tid as Dr. Delfina Redwood recommends.  Pt will need a prescription soon as she will use up the current 300 mg tabs quicker with increaed daily frequency. She will call when she needs the refill  With the correct dosing and tablets.

## 2018-06-30 NOTE — Telephone Encounter (Signed)
Patient called and stated "I wanted to touch base with [name] about the issue I'm still having. I was under [name] care with the chemo, but when I was finished she gave me back to [name]. Dr Patty [name] started me on gabapentin for the neuropathy and pain. I'm still having it, so I thought it would be a good idea to see a neurologist. My PCP [name] referred me to [name]. She increased my gabapentin from 200 mg to 300 mg. That is fine with me, but I wanted to see if [name] agreed. You can call me and leave a message if I don't answer."  Message forwarded to [facility] APP

## 2018-07-15 ENCOUNTER — Telehealth: Payer: Self-pay

## 2018-07-15 DIAGNOSIS — R509 Fever, unspecified: Secondary | ICD-10-CM | POA: Diagnosis not present

## 2018-07-15 NOTE — Telephone Encounter (Signed)
Called the pt to prescreen and they informed me that they will need a covid test done by pcp

## 2018-07-23 ENCOUNTER — Other Ambulatory Visit: Payer: Self-pay

## 2018-07-23 ENCOUNTER — Ambulatory Visit (INDEPENDENT_AMBULATORY_CARE_PROVIDER_SITE_OTHER): Payer: Medicare Other | Admitting: Pharmacist

## 2018-07-23 DIAGNOSIS — I4891 Unspecified atrial fibrillation: Secondary | ICD-10-CM

## 2018-07-23 DIAGNOSIS — Z5181 Encounter for therapeutic drug level monitoring: Secondary | ICD-10-CM

## 2018-07-23 LAB — POCT INR: INR: 4.4 — AB (ref 2.0–3.0)

## 2018-07-23 NOTE — Patient Instructions (Signed)
Skip coumadin tonight and tomorrow then start taking 1 tablet daily except 1/2 tablet on Mondays and Fridays. Recheck INR in 2 weeks. Main [phone]. Coumadin Clinic # [phone]

## 2018-07-26 ENCOUNTER — Ambulatory Visit (INDEPENDENT_AMBULATORY_CARE_PROVIDER_SITE_OTHER): Payer: Medicare Other | Admitting: Podiatry

## 2018-07-26 ENCOUNTER — Encounter: Payer: Self-pay | Admitting: Podiatry

## 2018-07-26 ENCOUNTER — Other Ambulatory Visit: Payer: Self-pay

## 2018-07-26 VITALS — Temp 96.9°F

## 2018-07-26 DIAGNOSIS — M79676 Pain in unspecified toe(s): Secondary | ICD-10-CM

## 2018-07-26 DIAGNOSIS — L84 Corns and callosities: Secondary | ICD-10-CM

## 2018-07-26 DIAGNOSIS — G620 Drug-induced polyneuropathy: Secondary | ICD-10-CM

## 2018-07-26 DIAGNOSIS — B351 Tinea unguium: Secondary | ICD-10-CM

## 2018-07-26 NOTE — Progress Notes (Signed)
Subjective: "My nails are so long and tender." Patty [name] presents today for preventative foot care with history of neuropathy secondary to chemotherapy.  She states she may be due for another round of chemo in August. Patty [name] is followed for chronic painful mycotic toenails and calluses which interfere with daily activities and routine tasks.  Pain is aggravated when wearing enclosed shoe gear. Pain is getting progressively worse and relieved with periodic professional debridement.   [name], MD is her PCP and last visit was 2-3 months ago per patient recall.   Current Outpatient Medications:  .  acetaminophen (TYLENOL) 500 MG tablet, Take 500 mg by mouth at bedtime as needed for mild pain. , Disp: , Rfl:  .  anastrozole (ARIMIDEX) 1 MG tablet, Take 1 tablet (1 mg total) by mouth daily., Disp: 30 tablet, Rfl: 6 .  atorvastatin (LIPITOR) 80 MG tablet, TAKE 1 TABLET BY MOUTH EVERY DAY IN THE MORNING, Disp: 90 tablet, Rfl: 3 .  beta carotene w/minerals (OCUVITE) tablet, Take 1 tablet by mouth 2 (two) times daily. , Disp: , Rfl:  .  Calcium Carb-Cholecalciferol (CALTRATE 600+D) 600-800 MG-UNIT TABS, Take 1 tablet by mouth every evening. , Disp: , Rfl:  .  clopidogrel (PLAVIX) 75 MG tablet, Take 1 tablet (75 mg total) by mouth daily., Disp: 90 tablet, Rfl: 3 .  Coenzyme Q-10 100 MG capsule, Take 100-200 mg by mouth daily. , Disp: , Rfl:  .  diltiazem (CARDIZEM CD) 120 MG 24 hr capsule, TAKE ONE CAPSULE BY MOUTH AT BEDTIME (Patient taking differently: Take 120 mg by mouth at bedtime. ), Disp: 90 capsule, Rfl: 3 .  diltiazem (CARDIZEM CD) 240 MG 24 hr capsule, TAKE ONE CAPSULE BY MOUTH DAILY WITH BREAKFAST (Patient taking differently: Take 240 mg by mouth daily. ), Disp: 30 capsule, Rfl: 11 .  docusate sodium (COLACE) 100 MG capsule, Take 100 mg by mouth 2 (two) times daily., Disp: , Rfl:  .  furosemide (LASIX) 40 MG tablet, Take 2 tablets (80 mg total) by mouth daily., Disp: 60 tablet,  Rfl: 11 .  gabapentin (NEURONTIN) 100 MG capsule, PLEASE SEE ATTACHED FOR DETAILED DIRECTIONS, Disp: , Rfl:  .  isosorbide mononitrate (IMDUR) 60 MG 24 hr tablet, Take 1 tablet (60 mg total) by mouth daily., Disp: 90 tablet, Rfl: 3 .  KLOR-CON M20 20 MEQ tablet, TAKE 1 TABLET BY MOUTH EVERY DAY (Patient taking differently: Take 20 mEq by mouth at bedtime. ), Disp: 90 tablet, Rfl: 3 .  lidocaine-prilocaine (EMLA) cream, Apply 1 application topically as needed. (Patient taking differently: Apply 1 application topically as needed (port access). ), Disp: 30 g, Rfl: 6 .  metoprolol tartrate (LOPRESSOR) 100 MG tablet, Take 1 tablet (100 mg total) by mouth 2 (two) times daily., Disp: 60 tablet, Rfl: 9 .  Multiple Vitamin (MULTIVITAMIN) capsule, Take 1 capsule by mouth daily., Disp: , Rfl:  .  nitroGLYCERIN (NITROSTAT) 0.4 MG SL tablet, PLACE 1 TABLET (0.4 MG TOTAL) UNDER THE TONGUE EVERY 5 (FIVE) MINUTES AS NEEDED. FOR CHEST PAIN, Disp: 25 tablet, Rfl: 6 .  NON FORMULARY, Place 1 each into the nose at bedtime. CPAP setting 3.5, Disp: , Rfl:  .  Omega-3 Fatty Acids (FISH OIL) 1200 MG CAPS, Take 1,200 mg by mouth daily. , Disp: , Rfl:  .  ondansetron (ZOFRAN) 8 MG tablet, Take 1 tablet (8 mg total) by mouth every 8 (eight) hours as needed for nausea., Disp: 30 tablet, Rfl: 3 .  prochlorperazine (  COMPAZINE) 10 MG tablet, Take 1 tablet (10 mg total) by mouth every 6 (six) hours as needed for nausea or vomiting., Disp: 30 tablet, Rfl: 0 .  spironolactone (ALDACTONE) 25 MG tablet, Take 1 tablet (25 mg total) by mouth at bedtime as needed (swelling in ankles)., Disp: 30 tablet, Rfl: 6 .  warfarin (COUMADIN) 2 MG tablet, TAKE 1 TABLET DAILY EXCEPT 1/2 TABLET ON MONDAYS AND FRIDAYS OR AS DIRECTED BY COUMADIN CLINIC., Disp: 30 tablet, Rfl: 2 .  gabapentin (NEURONTIN) 300 MG capsule, Take 1 capsule (300 mg total) by mouth 2 (two) times daily., Disp: 180 capsule, Rfl: 2  Allergies  Allergen Reactions  . Bee Venom  Anaphylaxis  . Other Nausea And Vomiting and Other (See Comments)    Pain medications cause severe vomiting. Tolerated slow IV morphine drip  . Amiodarone Nausea Only  . Prednisone Other (See Comments)    "Rapid Heart Beat"    Objective: Vitals:   07/26/18 1123  Temp: (!) 96.9 F (36.1 C)    Vascular Examination: Capillary refill time immediate x 10 digits.  Dorsalis pedis pulses 2/4 b/l.  Posterior tibial pulses 2/4 b/l.  No digital hair x 10 digits.  Skin temperature gradient WNL b/l.  Dermatological Examination: Skin with normal turgor, texture and tone b/l.  Toenails 1-5 b/l discolored, thick, dystrophic with subungual debris and pain with palpation to nailbeds due to thickness of nails.  Hyperkeratotic lesion(s) medial aspect b/l hallux IPJ. No erythema, no edema, no drainage, no flocculence noted.   Musculoskeletal: Muscle strength 5/5 to all LE muscle groups  Neurological: Sensation intact with 10 gram monofilament.  Assessment: 1. Painful onychomycosis toenails 1-5 b/l 2. Calluses b/l hallux 3. Neuropathy secondary to chemotherapy  Plan: 1. Toenails 1-5 b/l were debrided in length and girth without iatrogenic bleeding. 2. Calluses pared b/l hallux utilizing sterile scalpel blade. Light bleeding left hallux addressed with Lumicain Hemostatic solution and alcohol. Triple antibiotic ointment and bandaid applied. She may remove band-aid on tomorrow. She is to apply triple antibiotic ointment to left hallux once daily for one week. Call if she experiences any problems. 3. Patient to continue soft, supportive shoe gear daily. 4. Patient to report any pedal injuries to medical professional immediately. 5. Follow up 3 months. She may call in 2 weeks sooner for her visit before she starts chemo again in August. She related understanding. 6. Patient/POA to call should there be a concern in the interim.

## 2018-07-26 NOTE — Patient Instructions (Signed)

## 2018-07-28 ENCOUNTER — Other Ambulatory Visit: Payer: Self-pay | Admitting: Interventional Cardiology

## 2018-07-31 ENCOUNTER — Other Ambulatory Visit: Payer: Self-pay | Admitting: Interventional Cardiology

## 2018-07-31 ENCOUNTER — Other Ambulatory Visit: Payer: Self-pay | Admitting: Hematology and Oncology

## 2018-08-02 ENCOUNTER — Telehealth: Payer: Self-pay

## 2018-08-02 NOTE — Telephone Encounter (Signed)

## 2018-08-09 ENCOUNTER — Ambulatory Visit (INDEPENDENT_AMBULATORY_CARE_PROVIDER_SITE_OTHER): Payer: Medicare Other | Admitting: *Deleted

## 2018-08-09 ENCOUNTER — Other Ambulatory Visit: Payer: Self-pay

## 2018-08-09 DIAGNOSIS — I4891 Unspecified atrial fibrillation: Secondary | ICD-10-CM

## 2018-08-09 DIAGNOSIS — Z5181 Encounter for therapeutic drug level monitoring: Secondary | ICD-10-CM | POA: Diagnosis not present

## 2018-08-09 DIAGNOSIS — I495 Sick sinus syndrome: Secondary | ICD-10-CM

## 2018-08-09 LAB — CUP PACEART REMOTE DEVICE CHECK
Date Time Interrogation Session: 20200706130810
Implantable Lead Implant Date: 20181224
Implantable Lead Implant Date: 20181224
Implantable Lead Location: 753859
Implantable Lead Location: 753860
Implantable Pulse Generator Implant Date: 20181224
Pulse Gen Model: 2272
Pulse Gen Serial Number: 8968200

## 2018-08-09 LAB — POCT INR: INR: 3.2 — AB (ref 2.0–3.0)

## 2018-08-09 NOTE — Patient Instructions (Signed)
Description   Skip coumadin tonight and then continue taking 1 tablet daily except 1/2 tablet on Mondays and Fridays. Recheck INR in 2 weeks. Main [phone]. [facility] # [phone]

## 2018-08-16 ENCOUNTER — Encounter: Payer: Self-pay | Admitting: Cardiology

## 2018-08-16 NOTE — Progress Notes (Signed)
Remote pacemaker transmission.   

## 2018-08-19 ENCOUNTER — Telehealth: Payer: Self-pay

## 2018-08-19 NOTE — Telephone Encounter (Signed)

## 2018-08-23 ENCOUNTER — Other Ambulatory Visit: Payer: Self-pay

## 2018-08-23 ENCOUNTER — Ambulatory Visit (INDEPENDENT_AMBULATORY_CARE_PROVIDER_SITE_OTHER): Payer: Medicare Other | Admitting: *Deleted

## 2018-08-23 DIAGNOSIS — I4891 Unspecified atrial fibrillation: Secondary | ICD-10-CM

## 2018-08-23 DIAGNOSIS — Z5181 Encounter for therapeutic drug level monitoring: Secondary | ICD-10-CM

## 2018-08-23 LAB — POCT INR: INR: 2.6 (ref 2.0–3.0)

## 2018-08-23 NOTE — Patient Instructions (Signed)
Description   Continue taking 1 tablet daily except 1/2 tablet on Mondays and Fridays. Recheck INR in 3 weeks. Main [phone]. [facility] # [phone]

## 2018-08-30 ENCOUNTER — Other Ambulatory Visit: Payer: Self-pay | Admitting: Interventional Cardiology

## 2018-08-30 DIAGNOSIS — I4821 Permanent atrial fibrillation: Secondary | ICD-10-CM

## 2018-09-01 ENCOUNTER — Other Ambulatory Visit: Payer: Self-pay

## 2018-09-01 ENCOUNTER — Inpatient Hospital Stay: Payer: Medicare Other

## 2018-09-01 ENCOUNTER — Inpatient Hospital Stay: Payer: Medicare Other | Attending: Hematology and Oncology

## 2018-09-01 DIAGNOSIS — C569 Malignant neoplasm of unspecified ovary: Secondary | ICD-10-CM | POA: Insufficient documentation

## 2018-09-01 DIAGNOSIS — D3910 Neoplasm of uncertain behavior of unspecified ovary: Secondary | ICD-10-CM

## 2018-09-01 LAB — BASIC METABOLIC PANEL
Anion gap: 7 (ref 5–15)
BUN: 20 mg/dL (ref 8–23)
CO2: 33 mmol/L — ABNORMAL HIGH (ref 22–32)
Calcium: 8.8 mg/dL — ABNORMAL LOW (ref 8.9–10.3)
Chloride: 103 mmol/L (ref 98–111)
Creatinine, Ser: 1 mg/dL (ref 0.44–1.00)
GFR calc Af Amer: 59 mL/min — ABNORMAL LOW (ref >60)
GFR calc non Af Amer: 51 mL/min — ABNORMAL LOW (ref >60)
Glucose, Bld: 111 mg/dL — ABNORMAL HIGH (ref 70–99)
Potassium: 3.3 mmol/L — ABNORMAL LOW (ref 3.5–5.1)
Sodium: 143 mmol/L (ref 135–145)

## 2018-09-01 MED ORDER — HEPARIN SOD (PORK) LOCK FLUSH 100 UNIT/ML IV SOLN
500.0000 [IU] | Freq: Once | INTRAVENOUS | Status: AC
  Administered 2018-09-01: 500 [IU]
  Filled 2018-09-01: qty 5

## 2018-09-01 MED ORDER — SODIUM CHLORIDE 0.9% FLUSH
10.0000 mL | Freq: Once | INTRAVENOUS | Status: AC
  Administered 2018-09-01: 10 mL
  Filled 2018-09-01: qty 10

## 2018-09-03 ENCOUNTER — Ambulatory Visit (HOSPITAL_COMMUNITY)
Admission: RE | Admit: 2018-09-03 | Discharge: 2018-09-03 | Disposition: A | Discharge status: Home / Self Care | Payer: Medicare Other | Source: Ambulatory Visit | Attending: Gynecologic Oncology | Admitting: Gynecologic Oncology

## 2018-09-03 ENCOUNTER — Other Ambulatory Visit: Payer: Self-pay

## 2018-09-03 ENCOUNTER — Encounter (HOSPITAL_COMMUNITY): Payer: Self-pay

## 2018-09-03 ENCOUNTER — Ambulatory Visit (HOSPITAL_COMMUNITY): Payer: Medicare Other

## 2018-09-03 DIAGNOSIS — C569 Malignant neoplasm of unspecified ovary: Secondary | ICD-10-CM | POA: Diagnosis not present

## 2018-09-03 DIAGNOSIS — C482 Malignant neoplasm of peritoneum, unspecified: Secondary | ICD-10-CM | POA: Diagnosis not present

## 2018-09-03 DIAGNOSIS — R1909 Other intra-abdominal and pelvic swelling, mass and lump: Secondary | ICD-10-CM | POA: Insufficient documentation

## 2018-09-03 MED ORDER — SODIUM CHLORIDE (PF) 0.9 % IJ SOLN
INTRAMUSCULAR | Status: AC
  Filled 2018-09-03: qty 50

## 2018-09-03 MED ORDER — HEPARIN SOD (PORK) LOCK FLUSH 100 UNIT/ML IV SOLN
500.0000 [IU] | Freq: Once | INTRAVENOUS | Status: AC
  Administered 2018-09-03: 500 [IU] via INTRAVENOUS

## 2018-09-03 MED ORDER — IOHEXOL 300 MG/ML  SOLN
100.0000 mL | Freq: Once | INTRAMUSCULAR | Status: AC | PRN
  Administered 2018-09-03: 13:00:00 100 mL via INTRAVENOUS

## 2018-09-03 MED ORDER — HEPARIN SOD (PORK) LOCK FLUSH 100 UNIT/ML IV SOLN
INTRAVENOUS | Status: AC
  Filled 2018-09-03: qty 5

## 2018-09-06 ENCOUNTER — Inpatient Hospital Stay: Payer: Medicare Other | Attending: Hematology and Oncology | Admitting: Gynecologic Oncology

## 2018-09-06 ENCOUNTER — Encounter: Payer: Self-pay | Admitting: Gynecologic Oncology

## 2018-09-06 ENCOUNTER — Telehealth: Payer: Self-pay | Admitting: Neurology

## 2018-09-06 ENCOUNTER — Ambulatory Visit: Payer: Medicare Other | Admitting: Neurology

## 2018-09-06 ENCOUNTER — Other Ambulatory Visit: Payer: Self-pay

## 2018-09-06 VITALS — BP 148/58 | HR 60 | Temp 98.5°F | Resp 17 | Ht 60.0 in | Wt 178.2 lb

## 2018-09-06 DIAGNOSIS — R001 Bradycardia, unspecified: Secondary | ICD-10-CM | POA: Insufficient documentation

## 2018-09-06 DIAGNOSIS — I4821 Permanent atrial fibrillation: Secondary | ICD-10-CM | POA: Diagnosis not present

## 2018-09-06 DIAGNOSIS — Z90710 Acquired absence of both cervix and uterus: Secondary | ICD-10-CM | POA: Diagnosis not present

## 2018-09-06 DIAGNOSIS — Z79899 Other long term (current) drug therapy: Secondary | ICD-10-CM | POA: Diagnosis not present

## 2018-09-06 DIAGNOSIS — E669 Obesity, unspecified: Secondary | ICD-10-CM | POA: Insufficient documentation

## 2018-09-06 DIAGNOSIS — Z79818 Long term (current) use of other agents affecting estrogen receptors and estrogen levels: Secondary | ICD-10-CM

## 2018-09-06 DIAGNOSIS — C786 Secondary malignant neoplasm of retroperitoneum and peritoneum: Secondary | ICD-10-CM | POA: Diagnosis not present

## 2018-09-06 DIAGNOSIS — C569 Malignant neoplasm of unspecified ovary: Secondary | ICD-10-CM

## 2018-09-06 DIAGNOSIS — Z87891 Personal history of nicotine dependence: Secondary | ICD-10-CM | POA: Diagnosis not present

## 2018-09-06 DIAGNOSIS — Z7901 Long term (current) use of anticoagulants: Secondary | ICD-10-CM | POA: Insufficient documentation

## 2018-09-06 DIAGNOSIS — I2510 Atherosclerotic heart disease of native coronary artery without angina pectoris: Secondary | ICD-10-CM | POA: Insufficient documentation

## 2018-09-06 DIAGNOSIS — I130 Hypertensive heart and chronic kidney disease with heart failure and stage 1 through stage 4 chronic kidney disease, or unspecified chronic kidney disease: Secondary | ICD-10-CM | POA: Diagnosis not present

## 2018-09-06 DIAGNOSIS — G4733 Obstructive sleep apnea (adult) (pediatric): Secondary | ICD-10-CM | POA: Insufficient documentation

## 2018-09-06 DIAGNOSIS — R1909 Other intra-abdominal and pelvic swelling, mass and lump: Secondary | ICD-10-CM

## 2018-09-06 DIAGNOSIS — Z90722 Acquired absence of ovaries, bilateral: Secondary | ICD-10-CM

## 2018-09-06 DIAGNOSIS — I252 Old myocardial infarction: Secondary | ICD-10-CM | POA: Diagnosis not present

## 2018-09-06 DIAGNOSIS — D3910 Neoplasm of uncertain behavior of unspecified ovary: Secondary | ICD-10-CM

## 2018-09-06 DIAGNOSIS — Z950 Presence of cardiac pacemaker: Secondary | ICD-10-CM | POA: Insufficient documentation

## 2018-09-06 MED ORDER — MEGESTROL ACETATE 40 MG PO TABS: 40.0000 mg | ORAL_TABLET | Freq: Two times a day (BID) | ORAL | 11 refills | Status: DC

## 2018-09-06 NOTE — Telephone Encounter (Signed)
I will call patient tomorrow and we can make a plan but patient can't make it today because she has an oncology appointment please take her off the schedule, do not no-show her, and block my appointments for the rest of the day thank you.

## 2018-09-06 NOTE — Patient Instructions (Signed)
Dr Denman George recommends taking Megace 40mg  twice a day to control the granulosa cell tumor. You do not need to take the anastrazole medication that she prescribed you last time.  Please return to see Dr Denman George in 3 months after you have had your CT scan and Inhibin B drawn.  Please contact Dr Serita Grit office (at [phone]) in September, 2020 to request an appointment with her for November, 2020.

## 2018-09-06 NOTE — Progress Notes (Signed)
Gynecologic Oncology Follow-up Note: Gyn-Onc  Consult was requested by Dr. Alvy Bimler for the evaluation of Bemnet Trovato Oubre 85 y.o. female  CC:  Chief Complaint  Patient presents with  . Ovarian Cancer    granulosa cell tumor    Assessment/Plan:  Ms. Lamiya Naas [place]tell  is a 85 y.o.  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 Stable, asymptomatic disease on imaging.  Recommend changing anti-estrogen therapy as maintenance from anastrazole 31m daily (which she did not tolerate therefore did not use) to megace daily. We will re-evaluate in 3 months and if progressing or not tolerating, will consider changing to alternating progresterone and tamoxifen every 2 weeks.   I will see MAnelisseback in 3 months with a CT and Inhibin B  HPI: MCharlane Westryis an 85year old woman who is seen in consultation at the request of Dr GAlvy Bimlerfor granulosa cell ovarian cancer.   The patient's history began in approximately 2000 when she underwent ex lap, TAH, [facility] She did not receive adjuvant chemotherapy. Postoperatively she had recurrences in the peritoneum in 2004, 2007 and 2011.  The 2004 surgery was with Dr HExcell Seltzerand included resection of the recurrent tumor with small bowel resection and colectomy for diverticulosis. She has had a laparoscopic hernia repair with mesh in 2005. In 2007 she underwent resection of pelvic tumor recurrence with Dr HExcell Seltzerand ventral hernia repair with mesh.  In 2011 Dr HExcell Seltzerperformed a laparoscopic resection of suprapubic tumor. Her last CT was in 2010.   She has significant CAD with 6 stents and a history of CHF. She has a pacemaker which she states has a nonfunctioning battery and she is not currently paced. This was placed for tachycadia-bradycardia syndrome. She is obese with OSA. She has atrial fibrillation and is on coumadin.   In  October, 2018 she began noting intermittent right lower quadrant pains. Labs at that time showed:  CA 125 12/01/16: 35 Inhibin B 11/21/16: 119  CT abd/pelvis 12/09/16: A small suprapubic hernia is seen along the inferior margin of the surgical mass containing a loop of small bowel. No evidence of bowel obstruction or ischemia. Multiple new soft tissue masses are seen in the left suprapubic region and left lower quadrant, consistent with recurrent tumor. Largest in the left suprapubic region measures 3.8 x 2.8 cm, and 3.8 x 3.4 cm in left lower quadrant. There is also a new mass in the central small bowel mesenteric measuring 5.8 x 5.0 cm. These findings are consistent with recurrent carcinoma and peritoneal metastases.  She was provided with 3 options for proceeding with treatment of her recurrence:  1/ surgical resection and hernia repair. This will involve exploratory laparotomy, radical tumor debulking, possible bowel resection, possible partial cystectomy, and hernia repair with Dr HExcell Seltzerand myself.  She will require medical clearance as she has significant cardiac issues. 2/ chemotherapy with carboplatin and paclitaxel. I discussed that this may not achieve as long a remission as surgical intervention, but would involve a large radical procedure.  3/ A third alternative was hormonal therapy with progestin and tamoxifen in rotation.  She was not sure about which route to pursue, and given that she was asymptomatic with this slow growing tumor, took some time to reflect. In December, 2018 she received replacement of her pacemaker and was placed on Plavix and coumadin.  She has met with  her cardiologists, Dr [name] and Dr Irish Lack, who felt that she was a candidate for any of the approaches, however, would prefer until she was 1 year out from her last coronary catheterization procedure to be taken off anticoagulants.  CT abd/pelvis in February, 2019 showed essentially stable disease with some  mild increases in tumor size, and some decreased, but no new lesions. As she was asymptomatic she elected for expectant management at that time with plan for surgery after she was 1 year out from prior cardiac cath.  On 10/12/17 she was admitted for 5 days with intraperitoneal bleeding from her granulosa cell tumor masses. Her anticoagulant therapy was held as was her plavix and she was transfused. CT imaging on 10/16/17 showed the previously demonstrated 9.4 x 9.2 cm left anterior pelvic hemorrhage currently measures 9.5 x 7.3 cm and is less dense. More anteriorly located hemorrhage on the left previously measures 15.6 x 4.4 cm and currently measures 17.3 x 2.8 cm. A previously demonstrated 2.8 cm cystic and solid midline anterior peritoneal mass at the level of the upper pelvis measures 3.0 cm. The previously demonstrated 8.2 x 5.6 cm cystic and solid left pelvic mass currently measures 7.8 x 5.8 cm. There is less free peritoneal blood in the upper abdomen. Anterior hernia repair mesh is again demonstrated. A ventral hernia at the level of the inferior pelvis containing herniated small bowel without obstruction is again demonstrated. This also contains a small amount the urinary bladder.  Given the progression of her peritoneal disease on imaging, she was felt to be a better candidate for chemotherapy first with carboplatin and paclitaxel, followed by consideration for interval debulking if she demonstrates a good response.  Interval Hx: 10/29/17-03/30/18 she received single agent carboplatin x 7 doses. She was admitted for heart failure after cycle 1.  She otherwise tolerated therapy well. Her Inhibin B tumor marker remained stable at approximately 150.  CT imaging showed response, and in March, 2020 showed progressive response: Multiple intraperitoneal implants, overall similar in size, number and distribution to prior study from 01/04/2018. Likewise, the right lower lobe pulmonary nodules also  stable. No definite progression of disease on today's examination.  Inhibin B on 06/02/18: 150  Inhbin B from 09/01/18 pending.  CT abd/pelvis from 09/03/18 showed stable disease with slight decrease in one of the masses.  Ravyn reported that she had taken only a few days of anastazole due to intolerance (nausea).   Current Meds:  Outpatient Encounter Medications as of 09/06/2018  Medication Sig  . atorvastatin (LIPITOR) 80 MG tablet TAKE 1 TABLET BY MOUTH EVERY DAY IN THE MORNING  . beta carotene w/minerals (OCUVITE) tablet Take 1 tablet by mouth 2 (two) times daily.   . Calcium Carb-Cholecalciferol (CALTRATE 600+D) 600-800 MG-UNIT TABS Take 1 tablet by mouth every evening.   . clopidogrel (PLAVIX) 75 MG tablet Take 1 tablet (75 mg total) by mouth daily.  . Coenzyme Q-10 100 MG capsule Take 100-200 mg by mouth daily.   Marland Kitchen diltiazem (CARDIZEM CD) 120 MG 24 hr capsule TAKE ONE CAPSULE BY MOUTH AT BEDTIME (Patient taking differently: Take 120 mg by mouth at bedtime. )  . diltiazem (CARDIZEM CD) 240 MG 24 hr capsule TAKE ONE CAPSULE BY MOUTH DAILY WITH BREAKFAST  . docusate sodium (COLACE) 100 MG capsule Take 100 mg by mouth 2 (two) times daily.  . furosemide (LASIX) 40 MG tablet Take 2 tablets (80 mg total) by mouth daily.  Marland Kitchen gabapentin (NEURONTIN) 300 MG capsule TAKE 1  CAPSULE BY MOUTH TWICE A DAY  . isosorbide mononitrate (IMDUR) 60 MG 24 hr tablet Take 1 tablet (60 mg total) by mouth daily.  Marland Kitchen KLOR-CON M20 20 MEQ tablet TAKE 1 TABLET BY MOUTH EVERY DAY  . megestrol (MEGACE) 40 MG tablet Take 1 tablet (40 mg total) by mouth 2 (two) times daily.  . metoprolol tartrate (LOPRESSOR) 100 MG tablet TAKE 1 TABLET BY MOUTH TWICE A DAY  . Multiple Vitamin (MULTIVITAMIN) capsule Take 1 capsule by mouth daily.  . nitroGLYCERIN (NITROSTAT) 0.4 MG SL tablet PLACE 1 TABLET (0.4 MG TOTAL) UNDER THE TONGUE EVERY 5 (FIVE) MINUTES AS NEEDED. FOR CHEST PAIN  . NON FORMULARY Place 1 each into the nose at bedtime.  CPAP setting 3.5  . Omega-3 Fatty Acids (FISH OIL) 1200 MG CAPS Take 1,200 mg by mouth daily.   Marland Kitchen warfarin (COUMADIN) 2 MG tablet TAKE 1 TABLET DAILY EXCEPT 1/2 TABLET ON MONDAYS AND FRIDAYS OR AS DIRECTED BY COUMADIN CLINIC.  . [DISCONTINUED] acetaminophen (TYLENOL) 500 MG tablet Take 500 mg by mouth at bedtime as needed for mild pain.   . [DISCONTINUED] anastrozole (ARIMIDEX) 1 MG tablet Take 1 tablet (1 mg total) by mouth daily.  . [DISCONTINUED] gabapentin (NEURONTIN) 100 MG capsule PLEASE SEE ATTACHED FOR DETAILED DIRECTIONS  . [DISCONTINUED] lidocaine-prilocaine (EMLA) cream Apply 1 application topically as needed. (Patient taking differently: Apply 1 application topically as needed (port access). )  . [DISCONTINUED] ondansetron (ZOFRAN) 8 MG tablet Take 1 tablet (8 mg total) by mouth every 8 (eight) hours as needed for nausea.  . [DISCONTINUED] prochlorperazine (COMPAZINE) 10 MG tablet Take 1 tablet (10 mg total) by mouth every 6 (six) hours as needed for nausea or vomiting.  . [DISCONTINUED] spironolactone (ALDACTONE) 25 MG tablet Take 1 tablet (25 mg total) by mouth at bedtime as needed (swelling in ankles).   No facility-administered encounter medications on file as of 09/06/2018.     Allergy:  Allergies  Allergen Reactions  . Bee Venom Anaphylaxis  . Other Nausea And Vomiting and Other (See Comments)    Pain medications cause severe vomiting. Tolerated slow IV morphine drip  . Amiodarone Nausea Only  . Prednisone Other (See Comments)    "Rapid Heart Beat"    Social Hx:   Social History   Socioeconomic History  . Marital status: Widowed    Spouse name: Not on file  . Number of children: 4  . Years of education: Not on file  . Highest education level: Not on file  Occupational History  . Occupation: Retired Nurse, learning disability estate  Social Needs  . Financial resource strain: Not on file  . Food insecurity    Worry: Not on file    Inability: Not on file  . Transportation  needs    Medical: Not on file    Non-medical: Not on file  Tobacco Use  . Smoking status: Former Smoker    Packs/day: 1.00    Years: 32.00    Pack years: 32.00    Types: Cigarettes    Quit date: 02/03/1974    Years since quitting: 44.6  . Smokeless tobacco: Never Used  Substance and Sexual Activity  . Alcohol use: Yes    Alcohol/week: 5.0 standard drinks    Types: 5 Glasses of wine per week    Comment: occ  . Drug use: No  . Sexual activity: Never  Lifestyle  . Physical activity    Days per week: Not on file  Minutes per session: Not on file  . Stress: Not on file  Relationships  . Social Herbalist on phone: Not on file    Gets together: Not on file    Attends religious service: Not on file    Active member of club or organization: Not on file    Attends meetings of clubs or organizations: Not on file    Relationship status: Not on file  . Intimate partner violence    Fear of current or ex partner: Not on file    Emotionally abused: Not on file    Physically abused: Not on file    Forced sexual activity: Not on file  Other Topics Concern  . Not on file  Social History Narrative   Lives with family.    Past Surgical Hx:  Past Surgical History:  Procedure Laterality Date  . ABDOMINAL HYSTERECTOMY    . CARDIAC CATHETERIZATION  09/03/2007   EF 70%; Failed attempt at PCI to OM  . CARDIAC CATHETERIZATION  11/01/2003   EF 70%  . CARDIAC CATHETERIZATION N/A 12/14/2015   Procedure: Left Heart Cath and Coronary Angiography;  Surgeon: Burnell Blanks, MD;  Location: [place] [facility] LAB;  Service: Cardiovascular;  Laterality: N/A;  . CARDIOVERSION  12/31/2011   Procedure: CARDIOVERSION;  Surgeon: [name], MD;  Location: [facility] ENDOSCOPY;  Service: Cardiovascular;  Laterality: N/A;  . CARDIOVERSION N/A 12/31/2011   Procedure: CARDIOVERSION;  Surgeon: [name], MD;  Location: [facility] CATH LAB;  Service: Cardiovascular;  Laterality: N/A;  .  CATARACT EXTRACTION, BILATERAL  2015  . CHOLECYSTECTOMY  1980's  . COLON SURGERY  2004   colectomy for diverticulosis  . CORONARY ANGIOPLASTY WITH STENT PLACEMENT  2000; 08/11/2012; 11/12/2012   3 + 2 LAD & CFX; 2nd CFX stent 11/12/2012  . CORONARY ATHERECTOMY N/A 07/15/2016   Procedure: Coronary Atherectomy;  Surgeon: Martinique, Peter M, MD;  Location: [place] [facility];  Service: Cardiovascular;  Laterality: N/A;  . CORONARY BALLOON ANGIOPLASTY N/A 07/14/2016   Procedure: Coronary Balloon Angioplasty;  Surgeon: Martinique, Peter M, MD;  Location: [place] [facility] LAB;  Service: Cardiovascular;  Laterality: N/A;  . CORONARY STENT INTERVENTION N/A 07/15/2016   Procedure: Coronary Stent Intervention;  Surgeon: Martinique, Peter M, MD;  Location: [place] [facility] LAB;  Service: Cardiovascular;  Laterality: N/A;  . FRACTIONAL FLOW RESERVE WIRE  10/07/2013   Procedure: [place];  Surgeon: [name], MD;  Location: [facility] CATH LAB;  Service: Cardiovascular;;  . granulosa tumor excision  2000; 2003; 2004; 2007   "all in my abdomen including small intestines, outside my ?uterus/etc" (08/11/2012)  . HERNIA REPAIR  2005   "laparoscopic"  . IR IMAGING GUIDED PORT INSERTION  10/28/2017  . LEFT HEART CATH AND CORONARY ANGIOGRAPHY N/A 07/14/2016   Procedure: Left Heart Cath and Coronary Angiography;  Surgeon: Martinique, Peter M, MD;  Location: [place] [facility] LAB;  Service: Cardiovascular;  Laterality: N/A;  . LEFT HEART CATHETERIZATION WITH CORONARY ANGIOGRAM N/A 11/12/2012   Procedure: LEFT HEART CATHETERIZATION WITH CORONARY ANGIOGRAM;  Surgeon: [name], MD;  Location: [facility] CATH LAB;  Service: Cardiovascular;  Laterality: N/A;  . LEFT HEART CATHETERIZATION WITH CORONARY ANGIOGRAM N/A 10/07/2013   Procedure: LEFT HEART CATHETERIZATION WITH CORONARY ANGIOGRAM;  Surgeon: [name], MD;  Location: [facility] CATH LAB;  Service: Cardiovascular;  Laterality: N/A;  . LEFT HEART CATHETERIZATION WITH  CORONARY ANGIOGRAM N/A 12/14/2013   Procedure: LEFT HEART CATHETERIZATION  WITH CORONARY ANGIOGRAM;  Surgeon: Sinclair Grooms, MD;  Location: [facility] CATH LAB;  Service: Cardiovascular;  Laterality: N/A;  . LEFT HEART CATHETERIZATION WITH CORONARY ANGIOGRAM N/A 05/16/2014   Procedure: LEFT HEART CATHETERIZATION WITH CORONARY ANGIOGRAM;  Surgeon: Sherren Mocha, MD;  Location: [facility] CATH LAB;  Service: Cardiovascular;  Laterality: N/A;  . PERCUTANEOUS CORONARY INTERVENTION-BALLOON ONLY  08/04/2012   Procedure: PERCUTANEOUS CORONARY INTERVENTION-BALLOON ONLY;  Surgeon: [name], MD;  Location: [facility] CATH LAB;  Service: Cardiovascular;;  . PERCUTANEOUS CORONARY ROTOBLATOR INTERVENTION (PCI-R) N/A 08/11/2012   Procedure: PERCUTANEOUS CORONARY ROTOBLATOR INTERVENTION (PCI-R);  Surgeon: [name], MD;  Location: [facility] CATH LAB;  Service: Cardiovascular;  Laterality: N/A;  . PERCUTANEOUS CORONARY STENT INTERVENTION (PCI-S)  10/07/2013   Procedure: PERCUTANEOUS CORONARY STENT INTERVENTION (PCI-S);  Surgeon: [name], MD;  Location: [facility] CATH LAB;  Service: Cardiovascular;;  . PERMANENT PACEMAKER INSERTION N/A 03/16/2012   [name] (SJM) leadless pacemaker (LEADLESS II STUDY PATEINT)  . SALIVARY GLAND SURGERY  2000's   "had a little lump removed; granulosa related; it was benign" (08/11/2012)  . TEE WITHOUT CARDIOVERSION  12/31/2011   Procedure: TRANSESOPHAGEAL ECHOCARDIOGRAM (TEE);  Surgeon: [name], MD;  Location: [place];  Service: Cardiovascular;  Laterality: N/A;  . VARICOSE VEIN SURGERY Bilateral 1977    Past Medical Hx:  Past Medical History:  Diagnosis Date  . Bleeding behind the abdominal cavity 10/2017  . Chronic anticoagulation - coumadin, CHADS2VASC=6 05/17/2015  . CKD (chronic kidney disease), stage III ([place])   . Combined systolic and diastolic heart failure ([place])   . Coronary artery disease    a. s/p multiple stents - stenting x 2 to the LAD and x 1 to the LCx  in 2000, rotational arthrectomy to proximal LCx and DES to LAD in 2014 and DES to LCx in 2014, along with DES to LAD for re-in-stent stenosis in 2015, and DES to ostial LAD in 2016. b. 07/2016 - orbital atherectomy & DES to mid LCx.  . Diverticulitis   . Granulosa cell carcinoma ([place])    abd; last episode was in 2009  . Hyperlipidemia   . Hypertension   . LBBB (left bundle branch block)       . Myocardial infarction ([place]) 2002  . Obesity   . OSA on CPAP   . Ovarian ca ([place]) 2019  . Pacemaker failure    a. Prior leadless PPM with premature battery failure, being managed conservatively without replacement.  . Peripheral vascular disease ([place])   . Permanent atrial fibrillation 2013  . Renal artery stenosis ([place])   . Tachycardia-bradycardia syndrome ([place])    a. s/p leadless pacemaker ([name]) implanted by Dr [name]  . Varicose veins     Past Gynecological History:  Granulosa cell tumor of the ovary No LMP recorded. Patient has had a hysterectomy.  Family Hx:  Family History  Problem Relation Age of Onset  . Stroke Mother   . Heart attack Father   . Diabetes Father   . Hypertension Father   . Heart attack Brother   . Diabetes Brother   . Hypertension Brother   . Kidney failure Brother     Review of Systems:  Constitutional  Feels fatigued    ENT Normal appearing ears and nares bilaterally Skin/Breast  No rash, sores, jaundice, itching, dryness Cardiovascular  No chest pain, shortness of breath, or edema  Pulmonary  No cough or wheeze.  Gastro Intestinal  No nausea, vomitting, or diarrhoea. No bright  red blood per rectum, no abdominal pain, change in bowel movement, or constipation.  Genito Urinary  No frequency, urgency, dysuria, no pelvic pain Musculo Skeletal  No myalgia, arthralgia, joint swelling or pain  Neurologic  No weakness, change in gait, + neuropathy Psychology  No depression, anxiety, insomnia.   Vitals:  Blood pressure (!) 148/58, pulse 60,  temperature 98.5 F (36.9 C), temperature source Oral, resp. rate 17, height 5' (1.524 m), weight 178 lb 3.2 oz (80.8 kg), SpO2 95 %.  Physical Exam: WD in NAD Neck  Supple NROM, without any enlargements.  Lymph Node Survey No cervical supraclavicular or inguinal adenopathy Cardiovascular  Pulse normal rate, regularity and rhythm. S1 and S2 normal.  Lungs  Clear to auscultation bilateraly, without wheezes/crackles/rhonchi. Good air movement.  Skin  No rash/lesions/breakdown  Psychiatry  Alert and oriented to person, place, and time  Abdomen  Normoactive bowel sounds, abdomen soft, non-tender and obese. No palpable masses but the hernia is appreciated in the lower left abdomen.  Back No CVA tenderness Genito Urinary  No palpable masses on pelvic examination Rectal  deferred Extremities  No bilateral cyanosis, clubbing or edema.  Thereasa Solo, MD  09/06/2018, 12:58 PM

## 2018-09-06 NOTE — Telephone Encounter (Signed)
Noted thanks °

## 2018-09-07 ENCOUNTER — Other Ambulatory Visit: Payer: Self-pay | Admitting: Interventional Cardiology

## 2018-09-07 LAB — INHIBIN B: Inhibin B: 119.4 pg/mL — ABNORMAL HIGH (ref 0.0–16.9)

## 2018-09-07 MED ORDER — DILTIAZEM HCL ER COATED BEADS 120 MG PO CP24: 120.0000 mg | ORAL_CAPSULE | Freq: Every day | ORAL | 1 refills | Status: DC

## 2018-09-07 NOTE — Telephone Encounter (Signed)
Pt's medication was sent to pt's pharmacy as requested. Confirmation recieved.

## 2018-09-08 NOTE — Telephone Encounter (Signed)
Patty [name], can you call this new patient and see if she can come see me on Monday if she still wants? Would you ask hr first?

## 2018-09-13 ENCOUNTER — Telehealth: Payer: Self-pay | Admitting: *Deleted

## 2018-09-13 ENCOUNTER — Ambulatory Visit (INDEPENDENT_AMBULATORY_CARE_PROVIDER_SITE_OTHER): Payer: Medicare Other | Admitting: Pharmacist

## 2018-09-13 ENCOUNTER — Telehealth: Payer: Self-pay

## 2018-09-13 ENCOUNTER — Other Ambulatory Visit: Payer: Self-pay

## 2018-09-13 ENCOUNTER — Telehealth: Payer: Self-pay | Admitting: Interventional Cardiology

## 2018-09-13 DIAGNOSIS — Z5181 Encounter for therapeutic drug level monitoring: Secondary | ICD-10-CM | POA: Diagnosis not present

## 2018-09-13 DIAGNOSIS — G4733 Obstructive sleep apnea (adult) (pediatric): Secondary | ICD-10-CM | POA: Diagnosis not present

## 2018-09-13 DIAGNOSIS — I4891 Unspecified atrial fibrillation: Secondary | ICD-10-CM | POA: Diagnosis not present

## 2018-09-13 LAB — POCT INR: INR: 3 (ref 2.0–3.0)

## 2018-09-13 NOTE — Telephone Encounter (Signed)
Dr. Greer Pickerel from [facility], she left a message. The message was "We can't approve the script for Megace for the patient for squmous cell unless the patient has had weight loss." forwarded to [facility] APP.

## 2018-09-13 NOTE — Telephone Encounter (Signed)
Called and spoke to patient. She states that her SOB has worsened over the past week. She states that her SOB is worse first thing in the morning when she wakes up and then with activity. She denies having any chest pain, swelling, or any other Sx. She states that she has gradually gained 9 lbs over the last 8 weeks but no significant weight gain over the past week. Patient does admit to eating foods that are high in salt. Patient takes lasix 80 mg QD and k-dur 20 mEq QD. Patient also has spironolactone that she uses prn, however she has not used it in the last 2 weeks. Instructed the patient to take her prn spironolactone. Instructed patient to avoid foods that are high in salt. Patient's last Cr- 1.00 and K-3.3 on 7/29. Patient is coming in for her INR check today. Per Dr. Irish Lack patient will need BMET check at her next INR check. Will arrange after her visit today.

## 2018-09-13 NOTE — Telephone Encounter (Signed)
Spoke with pharmacist at [facility] and told them that the megace was prior authorized. Pharmacist sent prescription in and it went through.  She will notify the patient when prescription is ready.

## 2018-09-13 NOTE — Telephone Encounter (Signed)
Pt c/o Shortness Of Breath: STAT if SOB developed within the last 24 hours or pt is noticeably SOB on the phone  1. Are you currently SOB (can you hear that pt is SOB on the phone)? yes  2. How long have you been experiencing SOB? Of an on for awhile.  3. Are you SOB when sitting or when up moving around? Most of the time moving around but both.  4. Are you currently experiencing any other symptoms? No

## 2018-09-13 NOTE — Patient Instructions (Signed)
Description   Continue taking 1 tablet daily except 1/2 tablet on Mondays and Fridays. Recheck INR in 5 weeks. Main [phone]. Coumadin Clinic # [phone]

## 2018-09-13 NOTE — Telephone Encounter (Signed)
Lab appointment arrange on 9/10 at next INR check

## 2018-09-14 ENCOUNTER — Encounter: Payer: Self-pay | Admitting: Neurology

## 2018-09-14 ENCOUNTER — Ambulatory Visit (INDEPENDENT_AMBULATORY_CARE_PROVIDER_SITE_OTHER): Payer: Medicare Other | Admitting: Neurology

## 2018-09-14 VITALS — BP 132/66 | HR 60 | Temp 97.8°F | Ht 60.0 in | Wt 175.0 lb

## 2018-09-14 DIAGNOSIS — M79604 Pain in right leg: Secondary | ICD-10-CM | POA: Diagnosis not present

## 2018-09-14 DIAGNOSIS — R262 Difficulty in walking, not elsewhere classified: Secondary | ICD-10-CM

## 2018-09-14 DIAGNOSIS — G629 Polyneuropathy, unspecified: Secondary | ICD-10-CM | POA: Diagnosis not present

## 2018-09-14 DIAGNOSIS — M79605 Pain in left leg: Secondary | ICD-10-CM

## 2018-09-14 DIAGNOSIS — M48062 Spinal stenosis, lumbar region with neurogenic claudication: Secondary | ICD-10-CM | POA: Diagnosis not present

## 2018-09-14 DIAGNOSIS — R200 Anesthesia of skin: Secondary | ICD-10-CM

## 2018-09-14 DIAGNOSIS — R7309 Other abnormal glucose: Secondary | ICD-10-CM

## 2018-09-14 DIAGNOSIS — I25118 Atherosclerotic heart disease of native coronary artery with other forms of angina pectoris: Secondary | ICD-10-CM

## 2018-09-14 DIAGNOSIS — R202 Paresthesia of skin: Secondary | ICD-10-CM

## 2018-09-14 NOTE — Progress Notes (Addendum)
[facility]    Provider:  Dr Jaynee Eagles Requesting Provider: [name], MD Primary Care Provider:  [name], MD  CC:  Sore and achy legs  HPI:  Patty [name] is a 85 y.o. female here as requested by [name], MD for neuropathy.  She has a past medical history of ovarian cancer, malignant granulosis cell tumor of ovary, peripheral neuropathy due to chemotherapy, lower extremity swelling, anemia, prediabetes, tachybradycardia syndrome, renal artery stenosis, permanent A. fib, peripheral vascular disease, OSA on CPAP, obesity, myocardial infarction, hypertension, hyperlipidemia, coronary artery disease, systolic and diastolic heart failure, chronic kidney disease stage III, chronic anticoagulation, she finished chemotherapy started last august 2019 and last chemo at the end of February was every 3 weeks. At that time she was getting weaker and hurting and aching. It hasn't gotten better since stopping the chemotherapy. It's been almost 6 months last was end of Fenruary. Starts at the low back, low back pain worsening, pain goes all the way down, she gets shooting pain from her low back, weakness.  Better sitting and worse walking or standing.  Her legs feel achy and weak.  In May of 2019 it started in her back and she saw Dr. Delfina Redwood, since then worsened. Better when she gets up but she cannot walk long distances. Her hands are numb mostly digits 1-4. No neck pain or radicular symptoms but she does feel a shooting pain from her hands like a zap Bilaterally symmetric in the hands. Continuously numb in the hands not worse nocturnally.   Reviewed notes, labs and imaging from outside physicians, which showed:  CT scan of the head in 2011 showed nothing acute, but did show high cortical white matter hypodensity in the right frontal lobe, with more subtle deep white matter hypodensities most consistent with chronic ischemic small vessel disease.  I reviewed images and I agree  with above report.  Reviewed images for MRI of the brain and MRI of the head 2011.  Chronic microvascular ischemia on the white matter.  Unremarkable MRI.  Consistent with findings on CT above.  Review of Systems: Patient complains of symptoms per HPI as well as the following symptoms: No recent falls. Pertinent negatives and positives per HPI. All others negative.   Social History   Socioeconomic History   Marital status: Widowed    Spouse name: Not on file   Number of children: 4   Years of education: Not on file   Highest education level: Not on file  Occupational History   Occupation: Retired Pharmacist, hospital and Medical sales representative strain: Not on file   Food insecurity    Worry: Not on file    Inability: Not on file   Transportation needs    Medical: Not on file    Non-medical: Not on file  Tobacco Use   Smoking status: Former Smoker    Packs/day: 1.00    Years: 32.00    Pack years: 32.00    Types: Cigarettes    Quit date: 02/03/1974    Years since quitting: 44.7   Smokeless tobacco: Never Used  Substance and Sexual Activity   Alcohol use: Yes    Alcohol/week: 5.0 standard drinks    Types: 5 Glasses of wine per week    Comment: occ   Drug use: No   Sexual activity: Never  Lifestyle   Physical activity    Days per week: Not on file    Minutes per session: Not  on file   Stress: Not on file  Relationships   Social connections    Talks on phone: Not on file    Gets together: Not on file    Attends religious service: Not on file    Active member of club or organization: Not on file    Attends meetings of clubs or organizations: Not on file    Relationship status: Not on file   Intimate partner violence    Fear of current or ex partner: Not on file    Emotionally abused: Not on file    Physically abused: Not on file    Forced sexual activity: Not on file  Other Topics Concern   Not on file  Social History Narrative    Lives alone.    Family History  Problem Relation Age of Onset   Stroke Mother    Heart attack Father    Diabetes Father    Hypertension Father    Heart attack Brother    Diabetes Brother    Hypertension Brother    Kidney failure Brother     Past Medical History:  Diagnosis Date   Bleeding behind the abdominal cavity 10/2017   Chronic anticoagulation - coumadin, CHADS2VASC=6 05/17/2015   CKD (chronic kidney disease), stage III ([name])    Combined systolic and diastolic heart failure ([place])    Coronary artery disease    a. s/p multiple stents - stenting x 2 to the LAD and x 1 to the LCx in 2000, rotational arthrectomy to proximal LCx and DES to LAD in 2014 and DES to LCx in 2014, along with DES to LAD for re-in-stent stenosis in 2015, and DES to ostial LAD in 2016. b. 07/2016 - orbital atherectomy & DES to mid LCx.   Diverticulitis    Granulosa cell carcinoma ([place])    abd; last episode was in 2009   Hyperlipidemia    Hypertension    LBBB (left bundle branch block)    Myocardial infarction ([place]) 2002   Obesity    OSA on CPAP    Ovarian ca [facility]) 2019   Pacemaker failure    a. Prior leadless PPM with premature battery failure, being managed conservatively without replacement.   Peripheral vascular disease ([place])    Permanent atrial fibrillation 2013   Renal artery stenosis ([name])    Tachycardia-bradycardia syndrome ([place])    a. s/p leadless pacemaker ([name]) implanted by Dr [name]   Varicose veins     Patient Active Problem List   Diagnosis Date Noted   Stable angina ([place]) 10/09/2018   Coagulopathy ([place]) 10/09/2018   Acute on chronic combined systolic and diastolic CHF (congestive heart failure) ([place]) 10/08/2018   Peripheral neuropathy due to chemotherapy ([place]) 11/19/2017   Pancytopenia, acquired ([place]) 11/19/2017   Acute on chronic combined systolic (congestive) and diastolic (congestive) heart failure ([place]) 11/03/2017   Hyperglycemia  11/03/2017   Protein-calorie malnutrition, moderate ([place]) 10/28/2017   Other constipation 10/20/2017   Postherpetic neuralgia 10/20/2017   Cancer associated pain 10/20/2017   Physical debility 10/20/2017   Goals of care, counseling/discussion 10/20/2017   Ovarian cancer ([place]) 10/19/2017   Intraperitoneal hemorrhage    Abdominal pain 10/12/2017   Pain of joint of left ankle and foot 08/03/2017   Persistent atrial fibrillation    Warfarin anticoagulation    DOE (dyspnea on exertion)    Status post coronary artery stent placement    Normocytic anemia 07/11/2016   History of small bowel obstruction 12/22/2015   Permanent atrial  fibrillation with RVR 12/15/2015   Demand ischemia ([place]) 12/15/2015   NSTEMI (non-ST elevated myocardial infarction) ([place]) 05/18/2015   Chronic anticoagulation - coumadin, CHADS2VASC=6 05/17/2015   Unstable angina ([place]) 05/17/2015   Swelling of lower extremity 04/18/2015   Encounter for therapeutic drug monitoring 08/25/2014   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[place]) 06/16/2014   Ischemic chest pain ([name])    OSA on CPAP 05/14/2014   Chest pain 01/05/2014   Atrial flutter ([place])    Hypokalemia    Pacemaker - St Jude Leadless PPM    Combined systolic and diastolic heart failure, NYHA class 3 ([name])    Tachycardia-bradycardia syndrome ([place]) 09/30/2012   Atrial fibrillation with RVR ([place]) 12/28/2011   Ventral hernia 10/16/2011   Granulosa cell tumor of ovary 04/23/2011   CAD (coronary artery disease) 10/03/2010   Essential hypertension 10/03/2010   Hyperlipidemia 10/03/2010   Renal artery stenosis ([place]) 10/03/2010   Obesity 10/03/2010    Past Surgical History:  Procedure Laterality Date   ABDOMINAL HYSTERECTOMY     CARDIAC CATHETERIZATION  09/03/2007   EF 70%; Failed attempt at PCI to OM   CARDIAC CATHETERIZATION  11/01/2003   EF 70%   CARDIAC CATHETERIZATION N/A 12/14/2015   Procedure: Left Heart Cath and  Coronary Angiography;  Surgeon: Burnell Blanks, MD;  Location: [place] [facility] LAB;  Service: Cardiovascular;  Laterality: N/A;   CARDIOVERSION  12/31/2011   Procedure: CARDIOVERSION;  Surgeon: [name], MD;  Location: [facility] ENDOSCOPY;  Service: Cardiovascular;  Laterality: N/A;   CARDIOVERSION N/A 12/31/2011   Procedure: CARDIOVERSION;  Surgeon: [name], MD;  Location: [facility] CATH LAB;  Service: Cardiovascular;  Laterality: N/A;   CATARACT EXTRACTION, BILATERAL  2015   CHOLECYSTECTOMY  1980's   COLON SURGERY  2004   colectomy for diverticulosis   CORONARY ANGIOPLASTY WITH STENT PLACEMENT  2000    and 08/11/2012; 11/12/2012: 3 + 2 LAD & CFX; 2nd CFX stent 11/12/2012   CORONARY ATHERECTOMY N/A 07/15/2016   Procedure: Coronary Atherectomy;  Surgeon: Martinique, Peter M, MD;  Location: [place] [facility] LAB;  Service: Cardiovascular;  Laterality: N/A;   CORONARY BALLOON ANGIOPLASTY N/A 07/14/2016   Procedure: Coronary Balloon Angioplasty;  Surgeon: Martinique, Peter M, MD;  Location: [place] CV LAB;  Service: Cardiovascular;  Laterality: N/A;   CORONARY STENT INTERVENTION N/A 07/15/2016   Procedure: Coronary Stent Intervention;  Surgeon: Martinique, Peter M, MD;  Location: [place] CV LAB;  Service: Cardiovascular;  Laterality: N/A;   FRACTIONAL FLOW RESERVE WIRE  10/07/2013   Procedure: FRACTIONAL FLOW RESERVE WIRE;  Surgeon: [name], MD;  Location: [facility] CATH LAB;  Service: Cardiovascular;;   HERNIA REPAIR  2005   "laparoscopic"   IR IMAGING GUIDED PORT INSERTION  10/28/2017   LEFT HEART CATH AND CORONARY ANGIOGRAPHY N/A 07/14/2016   Procedure: Left Heart Cath and Coronary Angiography;  Surgeon: Martinique, Peter M, MD;  Location: [place] [facility] LAB;  Service: Cardiovascular;  Laterality: N/A;   LEFT HEART CATHETERIZATION WITH CORONARY ANGIOGRAM N/A 11/12/2012   Procedure: LEFT HEART CATHETERIZATION WITH CORONARY ANGIOGRAM;  Surgeon: [name], MD;   Location: [facility] CATH LAB;  Service: Cardiovascular;  Laterality: N/A;   LEFT HEART CATHETERIZATION WITH CORONARY ANGIOGRAM N/A 10/07/2013   Procedure: LEFT HEART CATHETERIZATION WITH CORONARY ANGIOGRAM;  Surgeon: [name], MD;  Location: [facility] CATH LAB;  Service: Cardiovascular;  Laterality: N/A;   LEFT HEART CATHETERIZATION WITH CORONARY ANGIOGRAM N/A 12/14/2013   Procedure: LEFT HEART  CATHETERIZATION WITH CORONARY ANGIOGRAM;  Surgeon: Sinclair Grooms, MD;  Location: [facility] CATH LAB;  Service: Cardiovascular;  Laterality: N/A;   LEFT HEART CATHETERIZATION WITH CORONARY ANGIOGRAM N/A 05/16/2014   Procedure: LEFT HEART CATHETERIZATION WITH CORONARY ANGIOGRAM;  Surgeon: Sherren Mocha, MD;  Location: [facility] CATH LAB;  Service: Cardiovascular;  Laterality: N/A;   PERCUTANEOUS CORONARY INTERVENTION-BALLOON ONLY  08/04/2012   Procedure: PERCUTANEOUS CORONARY INTERVENTION-BALLOON ONLY;  Surgeon: [name], MD;  Location: [facility] CATH LAB;  Service: Cardiovascular;;   PERCUTANEOUS CORONARY ROTOBLATOR INTERVENTION (PCI-R) N/A 08/11/2012   Procedure: PERCUTANEOUS CORONARY ROTOBLATOR INTERVENTION (PCI-R);  Surgeon: [name], MD;  Location: [facility] CATH LAB;  Service: Cardiovascular;  Laterality: N/A;   PERCUTANEOUS CORONARY STENT INTERVENTION (PCI-S)  10/07/2013   Procedure: PERCUTANEOUS CORONARY STENT INTERVENTION (PCI-S);  Surgeon: [name], MD;  Location: [facility] CATH LAB;  Service: Cardiovascular;;   PERMANENT PACEMAKER INSERTION N/A 03/16/2012   [name] (SJM) leadless pacemaker (LEADLESS II STUDY PATEINT)   SALIVARY GLAND SURGERY  2000's   "had a little lump removed; granulosa related; it was benign" (08/11/2012)   TEE WITHOUT CARDIOVERSION  12/31/2011   Procedure: TRANSESOPHAGEAL ECHOCARDIOGRAM (TEE);  Surgeon: [name], MD;  Location: [facility] ENDOSCOPY;  Service: Cardiovascular;  Laterality: N/A;   UMBILICAL GRANULOMA EXCISION  2000   2003; 2004; 2007: "all in my abdomen  including small intestines, outside my ?uterus/etc" (08/11/2012)   VARICOSE VEIN SURGERY Bilateral 1977    Current Outpatient Medications  Medication Sig Dispense Refill   acetaminophen (TYLENOL) 500 MG tablet Take 500 mg by mouth every 6 (six) hours as needed for mild pain or headache.      atorvastatin (LIPITOR) 80 MG tablet TAKE 1 TABLET BY MOUTH EVERY DAY IN THE MORNING (Patient taking differently: Take 80 mg by mouth daily. ) 90 tablet 3   beta carotene w/minerals (OCUVITE) tablet Take 1 tablet by mouth 2 (two) times daily.      Calcium Carb-Cholecalciferol (CALTRATE 600+D) 600-800 MG-UNIT TABS Take 1 tablet by mouth every evening.      clopidogrel (PLAVIX) 75 MG tablet Take 1 tablet (75 mg total) by mouth daily. 90 tablet 3   Coenzyme Q-10 100 MG capsule Take 200 mg by mouth at bedtime.      diltiazem (CARDIZEM CD) 120 MG 24 hr capsule Take 1 capsule (120 mg total) by mouth at bedtime. 90 capsule 1   diltiazem (CARDIZEM CD) 240 MG 24 hr capsule TAKE ONE CAPSULE BY MOUTH DAILY WITH BREAKFAST (Patient taking differently: Take 240 mg by mouth daily with breakfast. ) 30 capsule 11   docusate sodium (COLACE) 100 MG capsule Take 100 mg by mouth daily as needed for mild constipation (IF NO B/M AFTER 2 DAYS).      gabapentin (NEURONTIN) 300 MG capsule TAKE 1 CAPSULE BY MOUTH TWICE A DAY (Patient taking differently: Take 300 mg by mouth 2 (two) times daily. ) 60 capsule 8   isosorbide mononitrate (IMDUR) 60 MG 24 hr tablet Take 1 tablet (60 mg total) by mouth daily. 90 tablet 3   KLOR-CON M20 20 MEQ tablet TAKE 1 TABLET BY MOUTH EVERY DAY (Patient taking differently: Take 20 mEq by mouth daily. ) 90 tablet 1   metoprolol tartrate (LOPRESSOR) 100 MG tablet TAKE 1 TABLET BY MOUTH TWICE A DAY (Patient taking differently: Take 100 mg by mouth 2 (two) times daily. ) 60 tablet 9   Multiple Vitamin (MULTIVITAMIN) capsule Take 1 capsule by mouth daily.  Omega-3 Fatty Acids (FISH OIL) 1200  MG CAPS Take 1,200 mg by mouth daily.      spironolactone (ALDACTONE) 25 MG tablet Take 25 mg by mouth See admin instructions. Take 25 mg by mouth once a day at 4 PM     furosemide (LASIX) 40 MG tablet 2 tabs by mouth every morning. 2 additional tabs daily as needed for wt gain/abdominal distension. 90 tablet 3   lidocaine-prilocaine (EMLA) cream Apply to PAC site 1-2 hours prior to access 30 g 1   nitroGLYCERIN (NITROSTAT) 0.4 MG SL tablet PLACE 1 TABLET (0.4 MG TOTAL) UNDER THE TONGUE EVERY 5 (FIVE) MINUTES AS NEEDED FOR CHEST PAIN 75 tablet 2   Probiotic Product (PROBIOTIC-10 PO) Take 1 capsule by mouth daily.     warfarin (COUMADIN) 2 MG tablet Resume Monday 9/7 as follows: 1/2 tab daily on MWF. 1 tab daily on TTSS. 30 tablet 2   No current facility-administered medications for this visit.     Allergies as of 09/14/2018 - Review Complete 09/14/2018  Allergen Reaction Noted   Bee venom Anaphylaxis 12/28/2011   Other Nausea And Vomiting and Other (See Comments) 10/03/2010   Amiodarone Nausea Only 06/29/2017   Prednisone Other (See Comments) 06/29/2017    Vitals: BP 132/66    Pulse 60    Temp 97.8 F (36.6 C)    Ht 5' (1.524 m)    Wt 175 lb (79.4 kg)    BMI 34.18 kg/m  Last Weight:  Wt Readings from Last 1 Encounters:  10/18/18 172 lb 3.2 oz (78.1 kg)   Last Height:   Ht Readings from Last 1 Encounters:  10/18/18 5' (1.524 m)   Physical exam: Exam: Gen: NAD, conversant, well nourised, obese, well groomed                     CV: irregular rate and rhythm. No Carotid Bruits. +peripheral edema left leg worse than right leg distally with discoloration distally at the lower leg and ankles consistent with peripheral vascular disease, warm, nontender Eyes: Conjunctivae clear without exudates or hemorrhage  Neuro: Detailed Neurologic Exam  Speech:    Speech is normal; fluent and spontaneous with normal comprehension.  Cognition:    The patient is oriented to person,  place, and time;     recent and remote memory intact;     language fluent;     normal attention, concentration,     fund of knowledge Cranial Nerves:    The pupils are equal, round, and reactive to light.  Attempted for endoscopy could not visualize due to small pupils visual fields are full to finger confrontation. Extraocular movements are intact. Trigeminal sensation is intact and the muscles of mastication are normal. The face is symmetric (excess skin of the lids not appearing to be ptosis). The palate elevates in the midline. Hearing intact to voice. Voice is normal. Shoulder shrug is normal. The tongue has normal motion without fasciculations.   Coordination:    No dysmetria  Gait:    Patient uses a walker, no shuffling no ataxia  Motor Observation:    No asymmetry, no atrophy, and no involuntary movements noted. Tone:    Normal muscle tone.    Posture:    Posture is stooped  Strength: Left hip flexion 4/5, right 4+/5 Left leg ext 4/5, right 4+.5    Strength is V/V in the upper and lower limbs.      Sensation: intact to LT  Reflex Exam:  DTR's:   AJ absent, patelars trace, biceps normal  Toes:    The toes are equiv bilaterally.   Clonus:    Clonus is absent.  + Mcphalen's maneuver with more aching than tingly in the wrists +Tinel's sign at wrists   Assessment/Plan:  Patty [name] is a 85 y.o. female here as requested by [name], MD for neuropathy.  She has a past medical history of ovarian cancer, malignant granulosis cell tumor of ovary, peripheral neuropathy due to chemotherapy, lower extremity swelling, anemia, prediabetes, tachybradycardia syndrome, renal artery stenosis, permanent A. fib, peripheral vascular disease, OSA on CPAP, obesity, myocardial infarction, hypertension, hyperlipidemia, coronary artery disease, systolic and diastolic heart failure, chronic kidney disease stage III, chronic anticoagulation, she finished chemotherapy started last  august 2019 and last chemo at the end of February was every 3 weeks. At that time she was getting weaker and hurting and aching. It hasn't gotten better since stopping the chemotherapy. It's been almost 6 months last was end of Fenruary. Starts at the low back, low back pain worsening, pain goes all the way down, she gets shooting pain from her low back, weakness.  Better sitting and worse walking or standing.  Her legs feel achy and weak  (Addendum: discussed with manufacturer and cardiology as well as radiology. At this time due to pacemaker will not proceed with MRI. Will need CT Myelogram for lumbar spine stenosis evaluation).   - EMG/NCS both legs and both hands - MRI of the lumbar spine (she has  2pacemakers and stents); I was able to find the following implanted devices and I have emailed Drs. Varasani and Allred to see if she has any others: Stent 2016: stented with a 3.5 x 16 mm Synergy DES Device and lead info: [facility] [id number] and lead type [facility] [id number] tendril sts [facility] [phone] - Labs today to check for other causes of neuropathy  Orders Placed This Encounter  Procedures   MR LUMBAR SPINE WO CONTRAST   CT LUMBAR SPINE W WO CONTRAST   DG Myelogram Lumbar   TSH   B12 and Folate Panel   Methylmalonic acid, serum   Vitamin B1   CK   CBC   Comprehensive metabolic panel   Hemoglobin A1c   NCV with EMG(electromyography)   Cc: [name], MD,    Sarina Ill, MD  [facility] [address] [place], [place] [address]  Phone [phone] Fax [phone]

## 2018-09-14 NOTE — Patient Instructions (Signed)
EMG/NCS both legs and both hands MRI of the lumbar spine Labs today to check for other causes of neuropathy  Electromyoneurogram Electromyoneurogram is a test to check how well your muscles and nerves are working. This procedure includes the combined use of electromyogram (EMG) and nerve conduction study (NCS). EMG is used to look for muscular disorders. NCS, which is also called electroneurogram, measures how well your nerves are controlling your muscles. The procedures are usually done together to check if your muscles and nerves are healthy. If the results of the tests are abnormal, this may indicate disease or injury, such as a neuromuscular disease or peripheral nerve damage. Tell a health care provider about:  Any allergies you have.  All medicines you are taking, including vitamins, herbs, eye drops, creams, and over-the-counter medicines.  Any problems you or family members have had with anesthetic medicines.  Any blood disorders you have.  Any surgeries you have had.  Any medical conditions you have.  If you have a pacemaker.  Whether you are pregnant or may be pregnant. What are the risks? Generally, this is a safe procedure. However, problems may occur, including:  Infection where the electrodes were inserted.  Bleeding. What happens before the procedure? Medicines Ask your health care provider about:  Changing or stopping your regular medicines. This is especially important if you are taking diabetes medicines or blood thinners.  Taking medicines such as aspirin and ibuprofen. These medicines can thin your blood. Do not take these medicines unless your health care provider tells you to take them.  Taking over-the-counter medicines, vitamins, herbs, and supplements. General instructions  Your health care provider may ask you to avoid: ? Beverages that have caffeine, such as coffee and tea. ? Any products that contain nicotine or tobacco. These products include  cigarettes, e-cigarettes, and chewing tobacco. If you need help quitting, ask your health care provider.  Do not use lotions or creams on the same day that you will be having the procedure. What happens during the procedure? For EMG   Your health care provider will ask you to stay in a position so that he or she can access the muscle that will be studied. You may be standing, sitting, or lying down.  You may be given a medicine that numbs the area (local anesthetic).  A very thin needle that has an electrode will be inserted into your muscle.  Another small electrode will be placed on your skin near the muscle.  Your health care provider will ask you to continue to remain still.  The electrodes will send a signal that tells about the electrical activity of your muscles. You may see this on a monitor or hear it in the room.  After your muscles have been studied at rest, your health care provider will ask you to contract or flex your muscles. The electrodes will send a signal that tells about the electrical activity of your muscles.  Your health care provider will remove the electrodes and the electrode needles when the procedure is finished. The procedure may vary among health care providers and hospitals. For NCS   An electrode that records your nerve activity (recording electrode) will be placed on your skin by the muscle that is being studied.  An electrode that is used as a reference (reference electrode) will be placed near the recording electrode.  A paste or gel will be applied to your skin between the recording electrode and the reference electrode.  Your nerve will be  stimulated with a mild shock. Your health care provider will measure how much time it takes for your muscle to react.  Your health care provider will remove the electrodes and the gel when the procedure is finished. The procedure may vary among health care providers and hospitals. What happens after the  procedure?  It is up to you to get the results of your procedure. Ask your health care provider, or the department that is doing the procedure, when your results will be ready.  Your health care provider may: ? Give you medicines for any pain. ? Monitor the insertion sites to make sure that bleeding stops. Summary  Electromyoneurogram is a test to check how well your muscles and nerves are working.  If the results of the tests are abnormal, this may indicate disease or injury.  This is a safe procedure. However, problems may occur, such as bleeding and infection.  Your health care provider will do two tests to complete this procedure. One checks your muscles (EMG) and another checks your nerves (NCS).  It is up to you to get the results of your procedure. Ask your health care provider, or the department that is doing the procedure, when your results will be ready. This information is not intended to replace advice given to you by your health care provider. Make sure you discuss any questions you have with your health care provider. Document Released: 05/23/2004 Document Revised: 10/06/2017 Document Reviewed: 09/18/2017 Elsevier Patient Education  2020 [employer].

## 2018-09-15 ENCOUNTER — Telehealth: Payer: Self-pay | Admitting: *Deleted

## 2018-09-15 NOTE — Telephone Encounter (Signed)
Attempted to reach the patient to give her flush appts, no answer

## 2018-09-16 ENCOUNTER — Telehealth: Payer: Self-pay | Admitting: Neurology

## 2018-09-16 ENCOUNTER — Telehealth: Payer: Self-pay | Admitting: *Deleted

## 2018-09-16 ENCOUNTER — Encounter: Payer: Self-pay | Admitting: Interventional Cardiology

## 2018-09-16 ENCOUNTER — Telehealth: Payer: Self-pay | Admitting: Interventional Cardiology

## 2018-09-16 ENCOUNTER — Telehealth (INDEPENDENT_AMBULATORY_CARE_PROVIDER_SITE_OTHER): Payer: Medicare Other | Admitting: Interventional Cardiology

## 2018-09-16 VITALS — BP 131/77 | HR 105 | Ht 60.0 in | Wt 177.0 lb

## 2018-09-16 DIAGNOSIS — I4891 Unspecified atrial fibrillation: Secondary | ICD-10-CM

## 2018-09-16 DIAGNOSIS — R7989 Other specified abnormal findings of blood chemistry: Secondary | ICD-10-CM

## 2018-09-16 DIAGNOSIS — Z7901 Long term (current) use of anticoagulants: Secondary | ICD-10-CM

## 2018-09-16 DIAGNOSIS — I2510 Atherosclerotic heart disease of native coronary artery without angina pectoris: Secondary | ICD-10-CM | POA: Diagnosis not present

## 2018-09-16 DIAGNOSIS — I5042 Chronic combined systolic (congestive) and diastolic (congestive) heart failure: Secondary | ICD-10-CM | POA: Diagnosis not present

## 2018-09-16 DIAGNOSIS — R0600 Dyspnea, unspecified: Secondary | ICD-10-CM

## 2018-09-16 DIAGNOSIS — E785 Hyperlipidemia, unspecified: Secondary | ICD-10-CM | POA: Diagnosis not present

## 2018-09-16 DIAGNOSIS — R0609 Other forms of dyspnea: Secondary | ICD-10-CM

## 2018-09-16 DIAGNOSIS — I25118 Atherosclerotic heart disease of native coronary artery with other forms of angina pectoris: Secondary | ICD-10-CM

## 2018-09-16 DIAGNOSIS — E782 Mixed hyperlipidemia: Secondary | ICD-10-CM

## 2018-09-16 MED ORDER — FUROSEMIDE 40 MG PO TABS: 40.0000 mg | ORAL_TABLET | Freq: Two times a day (BID) | ORAL | 3 refills | Status: DC

## 2018-09-16 NOTE — Telephone Encounter (Signed)
Called and left the patient a message with her flush appts

## 2018-09-16 NOTE — Telephone Encounter (Signed)
I called the pt and LVM (ok per DPR) advising of the message below from Dr. Jaynee Eagles including pt being hypothyroid and pre-diabetic. More labs are still pending. Advised pt of the symptoms hypothyroidism can cause and advised her to call Dr. Lina Sar office. Also informed pt we would send the labs to Dr. Delfina Redwood.  All labs from 09/14/2018 ov with Dr. Jaynee Eagles faxed to Dr. Lina Sar office via routing method in chart.

## 2018-09-16 NOTE — Progress Notes (Signed)
Virtual Visit via Telephone Note   This visit type was conducted due to national recommendations for restrictions regarding the COVID-19 Pandemic (e.g. social distancing) in an effort to limit this patient's exposure and mitigate transmission in our community.  Due to her co-morbid illnesses, this patient is at least at moderate risk for complications without adequate follow up.  This format is felt to be most appropriate for this patient at this time.  The patient did not have access to video technology/had technical difficulties with video requiring transitioning to audio format only (telephone).  All issues noted in this document were discussed and addressed.  No physical exam could be performed with this format.  Please refer to the patient's chart for her  consent to telehealth for [facility].   Date:  09/16/2018   ID:  Patty [name], DOB 08/10/1930, MRN [id number]  Patient Location: Home Provider Location: Home  PCP:  [name], MD  Cardiologist:  Larae Grooms, MD  Electrophysiologist:  Thompson Grayer, MD   Evaluation Performed:  Follow-Up Visit  Chief Complaint:  Shortness of breath  History of Present Illness:    Patty [name] is a 85 y.o. female with history of CAD s/p multiple stents, chronic combined CHF, permanent atrial fib/flutter on Coumadin with tachy-brady syndrome s/p leadless PPM (with premature battery failure no longer active - being followed conservatively for now), probable CKD III per labs, granulosa cell carcinoma, HTN, HLD, LBBB, varicose veins, renal artery stenosis, PVD, diverticulitis, OSA who presents for f/u.  She has significant CAD with stenting x 2 to the LAD and x 1 to the LCx in 2000, rotational arthrectomy to proximal LCx and DES to LAD in 2014 and DES to LCx in 2014, along with DES to LAD for -in-stent stenosis in 2015, and DES to ostial LAD in 2016. PVD is listed in chart but I cannot find further notes regarding this as LE doppler 2014  showed no evidence of significant LE disease   She was admittedin June 2018with exertional dyspnea and chest tightness and palpitations. She was found to be in rapid atrial fib/flutter and minimally elevated troponin. 2D Echo 07/11/16 showed moderate focal basal and mild concentric hypertrophy, EF 50-55%, no RWMA, septal dyssyngery c/w LBBB, mild AI, massively dilated LA, trivial TR, mild PR. Coumadin was held in prep for cath which was performed 07/14/16 showing 100% CTO of OM1 with L-L collaterals, 90% distal Cx, 100% distal PLOM, otherwise nonobstructive disease, normal LVEDP - unsuccessful PCI of the distal Cx due to inability to cross the lesion with a balloon. She was brought back 07/15/16 and underwent successful orbital atherectomy &DES to mid LCx.   She had an episode of Shingles around Memorial Day 2019. It was on the right side of her chest. She was treated with acyclovir. She still has some itching. She had constipation and nausea. She treated this with peptobismol. She initially thought it was a heart episode, but then with the rash, realized it was shingles. She still has some trouble sleeping.   She had a lot of fatigue with the shingles. SHe had a lot of stress with her son who is an alcoholic.  He had intrabdominal bleeding in 10/19  from tumors.  Anticoagulation was stopped for a while and restarted when approval was given by her oncology team.   She has been treated with chemo and has responded well.  No bleeding, even after Coumadin was started.    On 8/10, we communicated with the  patient: "She states that her SOB has worsened over the past week. She states that her SOB is worse first thing in the morning when she wakes up and then with activity. She denies having any chest pain, swelling, or any other Sx. She states that she has gradually gained 9 lbs over the last 8 weeks but no significant weight gain over the past week. Patient does admit to eating foods that are  high in salt. Patient takes lasix 80 mg QD and k-dur 20 mEq QD. Patient also has spironolactone that she uses prn, however she has not used it in the last 2 weeks. Instructed the patient to take her prn spironolactone. Instructed patient to avoid foods that are high in salt. Patient's last Cr- 1.00 and K-3.3 on 7/29. Patient is coming in for her INR check today. Per Dr. Irish Lack patient will need BMET check at her next INR check. Will arrange after her visit today."  She has taken 2 doses of spironolactone. No change in sx. She sleeps well, with CPAP. Sx get better as the day goes on. Breathing improves.  She takes her Lasix dose first thing in the morning.   She does have leg weakness and is seeing a neuro MD. May be related to chemotherapy.  The patient does not have symptoms concerning for COVID-19 infection (fever, chills, cough, or new shortness of breath).    Past Medical History:  Diagnosis Date   Bleeding behind the abdominal cavity 10/2017   Chronic anticoagulation - coumadin, CHADS2VASC=6 05/17/2015   CKD (chronic kidney disease), stage III ([name])    Combined systolic and diastolic heart failure ([name])    Coronary artery disease    a. s/p multiple stents - stenting x 2 to the LAD and x 1 to the LCx in 2000, rotational arthrectomy to proximal LCx and DES to LAD in 2014 and DES to LCx in 2014, along with DES to LAD for re-in-stent stenosis in 2015, and DES to ostial LAD in 2016. b. 07/2016 - orbital atherectomy & DES to mid LCx.   Diverticulitis    Granulosa cell carcinoma ([place])    abd; last episode was in 2009   Hyperlipidemia    Hypertension    LBBB (left bundle branch block)        Myocardial infarction ([place]) 2002   Obesity    OSA on CPAP    Ovarian ca [facility]) 2019   Pacemaker failure    a. Prior leadless PPM with premature battery failure, being managed conservatively without replacement.   Peripheral vascular disease ([place])    Permanent atrial fibrillation 2013     Renal artery stenosis ([name])    Tachycardia-bradycardia syndrome ([place])    a. s/p leadless pacemaker ([name]) implanted by Dr [name]   Varicose veins    Past Surgical History:  Procedure Laterality Date   ABDOMINAL HYSTERECTOMY     CARDIAC CATHETERIZATION  09/03/2007   EF 70%; Failed attempt at PCI to OM   CARDIAC CATHETERIZATION  11/01/2003   EF 70%   CARDIAC CATHETERIZATION N/A 12/14/2015   Procedure: Left Heart Cath and Coronary Angiography;  Surgeon: Burnell Blanks, MD;  Location: [place] CV LAB;  Service: Cardiovascular;  Laterality: N/A;   CARDIOVERSION  12/31/2011   Procedure: CARDIOVERSION;  Surgeon: [name], MD;  Location: [facility] ENDOSCOPY;  Service: Cardiovascular;  Laterality: N/A;   CARDIOVERSION N/A 12/31/2011   Procedure: CARDIOVERSION;  Surgeon: [name], MD;  Location: [facility] CATH LAB;  Service: Cardiovascular;  Laterality: N/A;   CATARACT EXTRACTION, BILATERAL  2015   CHOLECYSTECTOMY  1980's   COLON SURGERY  2004   colectomy for diverticulosis   CORONARY ANGIOPLASTY WITH STENT PLACEMENT  2000; 08/11/2012; 11/12/2012   3 + 2 LAD & CFX; 2nd CFX stent 11/12/2012   CORONARY ATHERECTOMY N/A 07/15/2016   Procedure: Coronary Atherectomy;  Surgeon: Martinique, Peter M, MD;  Location: [place] CV LAB;  Service: Cardiovascular;  Laterality: N/A;   CORONARY BALLOON ANGIOPLASTY N/A 07/14/2016   Procedure: Coronary Balloon Angioplasty;  Surgeon: Martinique, Peter M, MD;  Location: [place] CV LAB;  Service: Cardiovascular;  Laterality: N/A;   CORONARY STENT INTERVENTION N/A 07/15/2016   Procedure: Coronary Stent Intervention;  Surgeon: Martinique, Peter M, MD;  Location: [place] CV LAB;  Service: Cardiovascular;  Laterality: N/A;   FRACTIONAL FLOW RESERVE WIRE  10/07/2013   Procedure: FRACTIONAL FLOW RESERVE WIRE;  Surgeon: [name], MD;  Location: [facility] CATH LAB;  Service: Cardiovascular;;   granulosa tumor excision  2000; 2003; 2004;  2007   "all in my abdomen including small intestines, outside my ?uterus/etc" (08/11/2012)   HERNIA REPAIR  2005   "laparoscopic"   IR IMAGING GUIDED PORT INSERTION  10/28/2017   LEFT HEART CATH AND CORONARY ANGIOGRAPHY N/A 07/14/2016   Procedure: Left Heart Cath and Coronary Angiography;  Surgeon: Martinique, Peter M, MD;  Location: [place] CV LAB;  Service: Cardiovascular;  Laterality: N/A;   LEFT HEART CATHETERIZATION WITH CORONARY ANGIOGRAM N/A 11/12/2012   Procedure: LEFT HEART CATHETERIZATION WITH CORONARY ANGIOGRAM;  Surgeon: [name], MD;  Location: [facility] CATH LAB;  Service: Cardiovascular;  Laterality: N/A;   LEFT HEART CATHETERIZATION WITH CORONARY ANGIOGRAM N/A 10/07/2013   Procedure: LEFT HEART CATHETERIZATION WITH CORONARY ANGIOGRAM;  Surgeon: [name], MD;  Location: [facility] CATH LAB;  Service: Cardiovascular;  Laterality: N/A;   LEFT HEART CATHETERIZATION WITH CORONARY ANGIOGRAM N/A 12/14/2013   Procedure: LEFT HEART CATHETERIZATION WITH CORONARY ANGIOGRAM;  Surgeon: Sinclair Grooms, MD;  Location: [facility] CATH LAB;  Service: Cardiovascular;  Laterality: N/A;   LEFT HEART CATHETERIZATION WITH CORONARY ANGIOGRAM N/A 05/16/2014   Procedure: LEFT HEART CATHETERIZATION WITH CORONARY ANGIOGRAM;  Surgeon: Sherren Mocha, MD;  Location: [facility] CATH LAB;  Service: Cardiovascular;  Laterality: N/A;   PERCUTANEOUS CORONARY INTERVENTION-BALLOON ONLY  08/04/2012   Procedure: PERCUTANEOUS CORONARY INTERVENTION-BALLOON ONLY;  Surgeon: [name], MD;  Location: [facility] CATH LAB;  Service: Cardiovascular;;   PERCUTANEOUS CORONARY ROTOBLATOR INTERVENTION (PCI-R) N/A 08/11/2012   Procedure: PERCUTANEOUS CORONARY ROTOBLATOR INTERVENTION (PCI-R);  Surgeon: [name], MD;  Location: [facility] CATH LAB;  Service: Cardiovascular;  Laterality: N/A;   PERCUTANEOUS CORONARY STENT INTERVENTION (PCI-S)  10/07/2013   Procedure: PERCUTANEOUS CORONARY STENT INTERVENTION (PCI-S);  Surgeon: [name], MD;  Location: [facility] CATH LAB;  Service: Cardiovascular;;   PERMANENT PACEMAKER INSERTION N/A 03/16/2012   [name] (SJM) leadless pacemaker (LEADLESS II STUDY PATEINT)   SALIVARY GLAND SURGERY  2000's   "had a little lump removed; granulosa related; it was benign" (08/11/2012)   TEE WITHOUT CARDIOVERSION  12/31/2011   Procedure: TRANSESOPHAGEAL ECHOCARDIOGRAM (TEE);  Surgeon: [name], MD;  Location: [facility] ENDOSCOPY;  Service: Cardiovascular;  Laterality: N/A;   VARICOSE VEIN SURGERY Bilateral 1977     Current Meds  Medication Sig   acetaminophen (TYLENOL) 500 MG tablet Take 500 mg by mouth every 6 (six) hours as needed.   atorvastatin (LIPITOR) 80 MG tablet TAKE 1 TABLET BY MOUTH EVERY DAY  IN THE MORNING   beta carotene w/minerals (OCUVITE) tablet Take 1 tablet by mouth 2 (two) times daily.    Calcium Carb-Cholecalciferol (CALTRATE 600+D) 600-800 MG-UNIT TABS Take 1 tablet by mouth every evening.    clopidogrel (PLAVIX) 75 MG tablet Take 1 tablet (75 mg total) by mouth daily.   Coenzyme Q-10 100 MG capsule Take 100-200 mg by mouth daily.    diltiazem (CARDIZEM CD) 120 MG 24 hr capsule Take 1 capsule (120 mg total) by mouth at bedtime.   diltiazem (CARDIZEM CD) 240 MG 24 hr capsule TAKE ONE CAPSULE BY MOUTH DAILY WITH BREAKFAST   docusate sodium (COLACE) 100 MG capsule Take 100 mg by mouth 2 (two) times daily.   furosemide (LASIX) 40 MG tablet Take 2 tablets (80 mg total) by mouth daily.   gabapentin (NEURONTIN) 300 MG capsule TAKE 1 CAPSULE BY MOUTH TWICE A DAY   isosorbide mononitrate (IMDUR) 60 MG 24 hr tablet Take 1 tablet (60 mg total) by mouth daily.   KLOR-CON M20 20 MEQ tablet TAKE 1 TABLET BY MOUTH EVERY DAY   metoprolol tartrate (LOPRESSOR) 100 MG tablet TAKE 1 TABLET BY MOUTH TWICE A DAY   Multiple Vitamin (MULTIVITAMIN) capsule Take 1 capsule by mouth daily.   nitroGLYCERIN (NITROSTAT) 0.4 MG SL tablet PLACE 1 TABLET (0.4 MG TOTAL) UNDER THE  TONGUE EVERY 5 (FIVE) MINUTES AS NEEDED. FOR CHEST PAIN   NON FORMULARY Place 1 each into the nose at bedtime. CPAP setting 3.5   Omega-3 Fatty Acids (FISH OIL) 1200 MG CAPS Take 1,200 mg by mouth daily.    spironolactone (ALDACTONE) 25 MG tablet Take 25 mg by mouth daily as needed.   warfarin (COUMADIN) 2 MG tablet TAKE 1 TABLET DAILY EXCEPT 1/2 TABLET ON MONDAYS AND FRIDAYS OR AS DIRECTED BY COUMADIN CLINIC.     Allergies:   Bee venom, Other, Amiodarone, and Prednisone   Social History   Tobacco Use   Smoking status: Former Smoker    Packs/day: 1.00    Years: 32.00    Pack years: 32.00    Types: Cigarettes    Quit date: 02/03/1974    Years since quitting: 44.6   Smokeless tobacco: Never Used  Substance Use Topics   Alcohol use: Yes    Alcohol/week: 5.0 standard drinks    Types: 5 Glasses of wine per week    Comment: occ   Drug use: No     Family Hx: The patient's family history includes Diabetes in her brother and father; Heart attack in her brother and father; Hypertension in her brother and father; Kidney failure in her brother; Stroke in her mother.  ROS:   Please see the history of present illness.    DOE, weight gain All other systems reviewed and are negative.   Prior CV studies:   The following studies were reviewed today:  Prior cath results reviewed  Labs/Other Tests and Data Reviewed:    EKG:  No ECG reviewed.  Recent Labs: 11/03/2017: B Natriuretic Peptide 373.6 12/11/2017: Magnesium 1.7 09/14/2018: ALT 69; BUN 20; Creatinine, Ser 1.03; Hemoglobin 13.0; Platelets 219; Potassium 3.7; Sodium 142; TSH 8.300   Recent Lipid Panel Lab Results  Component Value Date/Time   CHOL 132 10/08/2017 09:05 AM   TRIG 68 10/08/2017 09:05 AM   HDL 66 10/08/2017 09:05 AM   CHOLHDL 2.0 10/08/2017 09:05 AM   CHOLHDL 2.8 07/11/2016 09:26 AM   LDLCALC 52 10/08/2017 09:05 AM    Wt Readings from Last 3  Encounters:  09/16/18 177 lb (80.3 kg)  09/14/18 175 lb (79.4  kg)  09/06/18 178 lb 3.2 oz (80.8 kg)     Objective:    Vital Signs:  BP 131/77    Pulse (!) 105    Ht 5' (1.524 m)    Wt 177 lb (80.3 kg)    BMI 34.57 kg/m    VITAL SIGNS:  reviewed GEN:  no acute distress RESPIRATORY:  no shortness of breath PSYCH:  normal affect exam limited by phone call format  ASSESSMENT & PLAN:    1. CAD: No clear angina. Has had some DOE as noted below, in the setting of some weight gain and increased salt consumption.  Last cath in 2018.  Continue aggressive secondary prevention. 2. Chronic combined CHF: Increased SHOB in the morning. COntinue with spironolactone for a few more days.  She will try to change her lasix to 40 mg BID rather than 80 mg in the AM.  She will back off the spironolactone if morning SHOB improves.  Limit salt intake. 3. Hyperlipidemia: LDL 52 in 9/19 4. AFib: HR has been around 100 when she checks.   5. Anticoagulated: No bleeding problems.  6. Elevated LFTs: SHe has been taking more acetaminophen recently, for her leg pain and back pain.  COVID-19 Education: The signs and symptoms of COVID-19 were discussed with the patient and how to seek care for testing (follow up with PCP or arrange E-visit).  The importance of social distancing was discussed today.  Time:   Today, I have spent 25 minutes with the patient with telehealth technology discussing the above problems.     Medication Adjustments/Labs and Tests Ordered: Current medicines are reviewed at length with the patient today.  Concerns regarding medicines are outlined above.   Tests Ordered: No orders of the defined types were placed in this encounter.   Medication Changes: No orders of the defined types were placed in this encounter.   Follow Up:  Virtual Visit or In Person in 3 week(s)  Signed, Larae Grooms, MD  09/16/2018 10:50 AM    [place]

## 2018-09-16 NOTE — Telephone Encounter (Signed)
Arranged for telephone visit with Dr. Irish Lack today.     Virtual Visit Pre-Appointment Phone Call TELEPHONE CALL NOTE  Patty [name] Patty [name] has been deemed a candidate for a follow-up tele-health visit to limit community exposure during the Covid-19 pandemic. I spoke with the patient via phone to ensure availability of phone/video source, confirm preferred email & phone number, and discuss instructions and expectations.  I reminded Patty [name] to be prepared with any vital sign and/or heart rhythm information that could potentially be obtained via home monitoring, at the time of her visit. I reminded Patty [name] to expect a phone call prior to her visit.  Patient agrees to consent below.  Patty Gustin, RN 09/16/2018 10:15 AM   FULL LENGTH CONSENT FOR TELE-HEALTH VISIT   I hereby voluntarily request, consent and authorize [facility] and its employed or contracted physicians, physician assistants, nurse practitioners or other licensed health care professionals (the Practitioner), to provide me with telemedicine health care services (the "Services") as deemed necessary by the treating Practitioner. I acknowledge and consent to receive the Services by the Practitioner via telemedicine. I understand that the telemedicine visit will involve communicating with the Practitioner through live audiovisual communication technology and the disclosure of certain medical information by electronic transmission. I acknowledge that I have been given the opportunity to request an in-person assessment or other available alternative prior to the telemedicine visit and am voluntarily participating in the telemedicine visit.  I understand that I have the right to withhold or withdraw my consent to the use of telemedicine in the course of my care at any time, without affecting my right to future care or treatment, and that the Practitioner or I may terminate the telemedicine visit at any time. I understand  that I have the right to inspect all information obtained and/or recorded in the course of the telemedicine visit and may receive copies of available information for a reasonable fee.  I understand that some of the potential risks of receiving the Services via telemedicine include:  Marland Kitchen Delay or interruption in medical evaluation due to technological equipment failure or disruption; . Information transmitted may not be sufficient (e.g. poor resolution of images) to allow for appropriate medical decision making by the Practitioner; and/or  . In rare instances, security protocols could fail, causing a breach of personal health information.  Furthermore, I acknowledge that it is my responsibility to provide information about my medical history, conditions and care that is complete and accurate to the best of my ability. I acknowledge that Practitioner's advice, recommendations, and/or decision may be based on factors not within their control, such as incomplete or inaccurate data provided by me or distortions of diagnostic images or specimens that may result from electronic transmissions. I understand that the practice of medicine is not an exact science and that Practitioner makes no warranties or guarantees regarding treatment outcomes. I acknowledge that I will receive a copy of this consent concurrently upon execution via email to the email address I last provided but may also request a printed copy by calling the office of [place].    I understand that my insurance will be billed for this visit.   I have read or had this consent read to me. . I understand the contents of this consent, which adequately explains the benefits and risks of the Services being provided via telemedicine.  . I have been provided ample opportunity to ask questions regarding this consent and the Services and have  had my questions answered to my satisfaction. . I give my informed consent for the services to be provided  through the use of telemedicine in my medical care  By participating in this telemedicine visit I agree to the above.

## 2018-09-16 NOTE — Telephone Encounter (Signed)
She is significantly hypothyroid based on the tsh lab I just saw. This can cause a lot of symptoms including weakness, fatigue, lethargy, weight gain, brain fog and many others. Also she is pre-diabetic a little worse than the last HgbA1c. There are still several labs pending but will you let patient know and forward the labs to Dr. Delfina Redwood? Ask patient to call Dr. Delfina Redwood as well thanks

## 2018-09-16 NOTE — Telephone Encounter (Signed)
I called the pt. Phone rang multiple times, no vm answer.

## 2018-09-16 NOTE — Telephone Encounter (Signed)
New Message     Pt c/o Shortness Of Breath: STAT if SOB developed within the last 24 hours or pt is noticeably SOB on the phone  1. Are you currently SOB (can you hear that pt is SOB on the phone)? NO, patient states they were at 3am   2. How long have you been experiencing SOB? About a week it's a chronic issue patient has been experiencing.   3. Are you SOB when sitting or when up moving around? Moving around.   4. Are you currently experiencing any other symptoms? NO

## 2018-09-16 NOTE — Telephone Encounter (Signed)
Called patient. She reports she has had increasingly worse shortness of breath on exertion since the middle of last week. She was having some swelling on Monday however she spoke with a nurse on Monday at her coumadin appointment and was advised to take spironolactone 25 mg daily as needed. Since Monday the swelling is better she has had a 2 lb weight drop but the shortness of breath remains. Per Dr. Irish Lack she was instructed to have her bmp checked at next inr appointment which is over a month away. She is concerned at this time frame due to her shortness of breath getting worse. She is currently taking lasix 40 mg twice daily and took spironolactone 25 mg on Monday and Tuesday she forgot to on Wednesday. She has taken nitro a couple times and this relieves the shortness of breath. She denies any chest pain. Will consult with Dr. Irish Lack and let patient know what the plan is going forward.

## 2018-09-16 NOTE — Patient Instructions (Addendum)
Medication Instructions:  Your physician has recommended you make the following change in your medication:   1. CHANGE: furosemide (lasix) 40 mg tablet: Take 1 tablet by mouth TWICE a day  2. Take your spironolactone for a few more days and then you may go back to taking in AS NEEDED if your morning shortness of breath improves.    Lab work: None Ordered  If you have labs (blood work) drawn today and your tests are completely normal, you will receive your results only by: Marland Kitchen MyChart Message (if you have MyChart) OR . A paper copy in the mail If you have any lab test that is abnormal or we need to change your treatment, we will call you to review the results.  Testing/Procedures: None ordered  Follow-Up: . We will arrange for a TELEPHONE Visit with Dr. Irish Lack in 2-3 weeks if symptoms do not improve  Any Other Special Instructions Will Be Listed Below (If Applicable).  Limit the amount of salt in your diet to less than 2,000 mg daily

## 2018-09-17 ENCOUNTER — Telehealth: Payer: Self-pay | Admitting: *Deleted

## 2018-09-17 LAB — CBC
Hematocrit: 38.2 % (ref 34.0–46.6)
Hemoglobin: 13 g/dL (ref 11.1–15.9)
MCH: 32.1 pg (ref 26.6–33.0)
MCHC: 34 g/dL (ref 31.5–35.7)
MCV: 94 fL (ref 79–97)
Platelets: 219 10*3/uL (ref 150–450)
RBC: 4.05 x10E6/uL (ref 3.77–5.28)
RDW: 14.5 % (ref 11.7–15.4)
WBC: 7.2 10*3/uL (ref 3.4–10.8)

## 2018-09-17 LAB — B12 AND FOLATE PANEL
Folate: >20 ng/mL (ref >3.0)
Vitamin B-12: 683 pg/mL (ref 232–1245)

## 2018-09-17 LAB — HEMOGLOBIN A1C
Est. average glucose Bld gHb Est-mCnc: 126 mg/dL
Hgb A1c MFr Bld: 6 % — ABNORMAL HIGH (ref 4.8–5.6)

## 2018-09-17 LAB — COMPREHENSIVE METABOLIC PANEL
ALT: 69 IU/L — ABNORMAL HIGH (ref 0–32)
AST: 53 IU/L — ABNORMAL HIGH (ref 0–40)
Albumin/Globulin Ratio: 1.5 (ref 1.2–2.2)
Albumin: 3.9 g/dL (ref 3.6–4.6)
Alkaline Phosphatase: 136 IU/L — ABNORMAL HIGH (ref 39–117)
BUN/Creatinine Ratio: 19 (ref 12–28)
BUN: 20 mg/dL (ref 8–27)
Bilirubin Total: 0.7 mg/dL (ref 0.0–1.2)
CO2: 29 mmol/L (ref 20–29)
Calcium: 9.1 mg/dL (ref 8.7–10.3)
Chloride: 98 mmol/L (ref 96–106)
Creatinine, Ser: 1.03 mg/dL — ABNORMAL HIGH (ref 0.57–1.00)
GFR calc Af Amer: 57 mL/min/{1.73_m2} — ABNORMAL LOW (ref >59)
GFR calc non Af Amer: 49 mL/min/{1.73_m2} — ABNORMAL LOW (ref >59)
Globulin, Total: 2.6 g/dL (ref 1.5–4.5)
Glucose: 100 mg/dL — ABNORMAL HIGH (ref 65–99)
Potassium: 3.7 mmol/L (ref 3.5–5.2)
Sodium: 142 mmol/L (ref 134–144)
Total Protein: 6.5 g/dL (ref 6.0–8.5)

## 2018-09-17 LAB — TSH: TSH: 8.3 u[IU]/mL — ABNORMAL HIGH (ref 0.450–4.500)

## 2018-09-17 LAB — VITAMIN B1: Thiamine: 168.3 nmol/L (ref 66.5–200.0)

## 2018-09-17 LAB — CK: Total CK: 69 U/L (ref 26–161)

## 2018-09-17 LAB — METHYLMALONIC ACID, SERUM: Methylmalonic Acid: 240 nmol/L (ref 0–378)

## 2018-09-17 NOTE — Telephone Encounter (Signed)
Patient called stating "I wanted to let Dr. Denman George I saw Dr. Jaynee Eagles on Tuesday. She has called me to tell me she diagnosed me with pre-diabetic and hypothyroid. I also wanted to know where to start the megace or not. I was already waiting to start it due to the side effects, and with this I wanted to make sure it was safe." Explained that I had talked with [facility] and we will notify Dr. Denman George the end of next week when she is back. Also explained that we will talk with Dr. Lorette Ang on Monday. Patient verbalized understanding

## 2018-09-20 ENCOUNTER — Telehealth: Payer: Self-pay

## 2018-09-20 NOTE — Telephone Encounter (Signed)
I spoke with Ms Dropper this afternoon.  Per Dr. Denman George and Dr. Alvy Bimler pt is to take her megace as prescribed.

## 2018-09-21 ENCOUNTER — Telehealth: Payer: Self-pay | Admitting: Neurology

## 2018-09-21 NOTE — Telephone Encounter (Signed)
It's not [facility] I sent her to it was [facility] Radiology department that said it is not MRI safe.

## 2018-09-21 NOTE — Telephone Encounter (Signed)
Patty [name] from [facility] called in and stated pt isnt safe to do MRI due to [facility]  CB# [phone]

## 2018-09-21 NOTE — Telephone Encounter (Signed)
faxed stent info to [facility] if MRI safe please contact the patient to schedule  Medicare/[employer] auth: [employer] via [facility] website    Stent info.  Stent 2016: stented with a 3.5 x 16 mm Synergy DES; [facility] 2272 assurity and lead type [facility] [id number] tendril sts; [facility] [phone]

## 2018-09-21 NOTE — Telephone Encounter (Signed)
Raquel Sarna and [place]: Dr. [name] is checking for me, he says he believes her pacemakers may be compatible and he is very kindly checking . FYI    Dr. [name], [facility] imaging doesn't think her pacemakers are compatible. But I will wait for your response.  I hope she can get the MRI. Thank you again Dr. [name]!

## 2018-09-30 ENCOUNTER — Telehealth: Payer: Self-pay

## 2018-09-30 DIAGNOSIS — D3910 Neoplasm of uncertain behavior of unspecified ovary: Secondary | ICD-10-CM

## 2018-09-30 DIAGNOSIS — N183 Chronic kidney disease, stage 3 (moderate): Secondary | ICD-10-CM | POA: Diagnosis not present

## 2018-09-30 DIAGNOSIS — R7303 Prediabetes: Secondary | ICD-10-CM | POA: Diagnosis not present

## 2018-09-30 DIAGNOSIS — R5383 Other fatigue: Secondary | ICD-10-CM | POA: Diagnosis not present

## 2018-09-30 DIAGNOSIS — E039 Hypothyroidism, unspecified: Secondary | ICD-10-CM | POA: Diagnosis not present

## 2018-09-30 MED ORDER — LIDOCAINE-PRILOCAINE 2.5-2.5 % EX CREA: TOPICAL_CREAM | CUTANEOUS | 1 refills | Status: DC

## 2018-09-30 NOTE — Telephone Encounter (Signed)
Pt needs a refill on her EMLA Cream to numb PAC site prior to access. Will send in 1 large tube to [facility] as requested by patient.

## 2018-10-04 DIAGNOSIS — R5383 Other fatigue: Secondary | ICD-10-CM | POA: Diagnosis not present

## 2018-10-04 NOTE — Telephone Encounter (Signed)
I have had [facility] follow-up with radiology to let them know that it is ok to proceed with MRI.  I am hopeful that the situation is resolved.   Please let me know if not.  Jeneen Rinks

## 2018-10-05 NOTE — Telephone Encounter (Signed)
You are the best Dr. [name], thank you!!  Raquel Sarna, can you follow up and make sure she gets scheduled? Thanks!

## 2018-10-08 ENCOUNTER — Other Ambulatory Visit: Payer: Self-pay

## 2018-10-08 ENCOUNTER — Encounter (HOSPITAL_COMMUNITY): Payer: Self-pay | Admitting: Emergency Medicine

## 2018-10-08 ENCOUNTER — Telehealth: Payer: Self-pay | Admitting: Interventional Cardiology

## 2018-10-08 ENCOUNTER — Emergency Department (HOSPITAL_COMMUNITY): Payer: Medicare Other

## 2018-10-08 ENCOUNTER — Observation Stay (HOSPITAL_COMMUNITY)
Admission: EM | Admit: 2018-10-08 | Discharge: 2018-10-09 | Disposition: A | Discharge status: Home / Self Care | Payer: Medicare Other | Attending: Cardiology | Admitting: Cardiology

## 2018-10-08 DIAGNOSIS — I110 Hypertensive heart disease with heart failure: Secondary | ICD-10-CM | POA: Diagnosis not present

## 2018-10-08 DIAGNOSIS — I4892 Unspecified atrial flutter: Secondary | ICD-10-CM | POA: Insufficient documentation

## 2018-10-08 DIAGNOSIS — I200 Unstable angina: Secondary | ICD-10-CM | POA: Diagnosis not present

## 2018-10-08 DIAGNOSIS — Z950 Presence of cardiac pacemaker: Secondary | ICD-10-CM | POA: Insufficient documentation

## 2018-10-08 DIAGNOSIS — R079 Chest pain, unspecified: Secondary | ICD-10-CM | POA: Diagnosis not present

## 2018-10-08 DIAGNOSIS — I739 Peripheral vascular disease, unspecified: Secondary | ICD-10-CM | POA: Diagnosis not present

## 2018-10-08 DIAGNOSIS — I700 Atherosclerosis of aorta: Secondary | ICD-10-CM | POA: Insufficient documentation

## 2018-10-08 DIAGNOSIS — Z955 Presence of coronary angioplasty implant and graft: Secondary | ICD-10-CM | POA: Insufficient documentation

## 2018-10-08 DIAGNOSIS — I2510 Atherosclerotic heart disease of native coronary artery without angina pectoris: Secondary | ICD-10-CM | POA: Insufficient documentation

## 2018-10-08 DIAGNOSIS — I208 Other forms of angina pectoris: Secondary | ICD-10-CM

## 2018-10-08 DIAGNOSIS — I4821 Permanent atrial fibrillation: Secondary | ICD-10-CM

## 2018-10-08 DIAGNOSIS — I447 Left bundle-branch block, unspecified: Secondary | ICD-10-CM | POA: Insufficient documentation

## 2018-10-08 DIAGNOSIS — I248 Other forms of acute ischemic heart disease: Secondary | ICD-10-CM | POA: Diagnosis present

## 2018-10-08 DIAGNOSIS — R0602 Shortness of breath: Secondary | ICD-10-CM | POA: Diagnosis not present

## 2018-10-08 DIAGNOSIS — I509 Heart failure, unspecified: Secondary | ICD-10-CM | POA: Diagnosis not present

## 2018-10-08 DIAGNOSIS — Z7901 Long term (current) use of anticoagulants: Secondary | ICD-10-CM | POA: Insufficient documentation

## 2018-10-08 DIAGNOSIS — E785 Hyperlipidemia, unspecified: Secondary | ICD-10-CM | POA: Diagnosis not present

## 2018-10-08 DIAGNOSIS — Z7902 Long term (current) use of antithrombotics/antiplatelets: Secondary | ICD-10-CM | POA: Diagnosis not present

## 2018-10-08 DIAGNOSIS — Z5111 Encounter for antineoplastic chemotherapy: Secondary | ICD-10-CM | POA: Insufficient documentation

## 2018-10-08 DIAGNOSIS — I252 Old myocardial infarction: Secondary | ICD-10-CM | POA: Diagnosis not present

## 2018-10-08 DIAGNOSIS — G4733 Obstructive sleep apnea (adult) (pediatric): Secondary | ICD-10-CM | POA: Insufficient documentation

## 2018-10-08 DIAGNOSIS — I5042 Chronic combined systolic (congestive) and diastolic (congestive) heart failure: Secondary | ICD-10-CM | POA: Insufficient documentation

## 2018-10-08 DIAGNOSIS — Z6833 Body mass index (BMI) 33.0-33.9, adult: Secondary | ICD-10-CM | POA: Diagnosis not present

## 2018-10-08 DIAGNOSIS — I5043 Acute on chronic combined systolic (congestive) and diastolic (congestive) heart failure: Secondary | ICD-10-CM | POA: Diagnosis not present

## 2018-10-08 DIAGNOSIS — R072 Precordial pain: Secondary | ICD-10-CM | POA: Diagnosis not present

## 2018-10-08 DIAGNOSIS — I130 Hypertensive heart and chronic kidney disease with heart failure and stage 1 through stage 4 chronic kidney disease, or unspecified chronic kidney disease: Secondary | ICD-10-CM | POA: Diagnosis not present

## 2018-10-08 DIAGNOSIS — Z87891 Personal history of nicotine dependence: Secondary | ICD-10-CM | POA: Diagnosis not present

## 2018-10-08 DIAGNOSIS — Z79899 Other long term (current) drug therapy: Secondary | ICD-10-CM | POA: Insufficient documentation

## 2018-10-08 DIAGNOSIS — N183 Chronic kidney disease, stage 3 (moderate): Secondary | ICD-10-CM | POA: Insufficient documentation

## 2018-10-08 DIAGNOSIS — Z8543 Personal history of malignant neoplasm of ovary: Secondary | ICD-10-CM | POA: Insufficient documentation

## 2018-10-08 DIAGNOSIS — E669 Obesity, unspecified: Secondary | ICD-10-CM | POA: Insufficient documentation

## 2018-10-08 DIAGNOSIS — I10 Essential (primary) hypertension: Secondary | ICD-10-CM | POA: Diagnosis not present

## 2018-10-08 DIAGNOSIS — Z20828 Contact with and (suspected) exposure to other viral communicable diseases: Secondary | ICD-10-CM | POA: Diagnosis not present

## 2018-10-08 DIAGNOSIS — R42 Dizziness and giddiness: Secondary | ICD-10-CM | POA: Diagnosis not present

## 2018-10-08 DIAGNOSIS — D689 Coagulation defect, unspecified: Secondary | ICD-10-CM

## 2018-10-08 DIAGNOSIS — R0902 Hypoxemia: Secondary | ICD-10-CM | POA: Diagnosis not present

## 2018-10-08 LAB — TSH: TSH: 2.089 u[IU]/mL (ref 0.350–4.500)

## 2018-10-08 LAB — BASIC METABOLIC PANEL
Anion gap: 10 (ref 5–15)
BUN: 24 mg/dL — ABNORMAL HIGH (ref 8–23)
CO2: 24 mmol/L (ref 22–32)
Calcium: 9 mg/dL (ref 8.9–10.3)
Chloride: 106 mmol/L (ref 98–111)
Creatinine, Ser: 1.12 mg/dL — ABNORMAL HIGH (ref 0.44–1.00)
GFR calc Af Amer: 52 mL/min — ABNORMAL LOW (ref >60)
GFR calc non Af Amer: 44 mL/min — ABNORMAL LOW (ref >60)
Glucose, Bld: 186 mg/dL — ABNORMAL HIGH (ref 70–99)
Potassium: 3.6 mmol/L (ref 3.5–5.1)
Sodium: 140 mmol/L (ref 135–145)

## 2018-10-08 LAB — PROTIME-INR
INR: 4.2 (ref 0.8–1.2)
Prothrombin Time: 40.1 seconds — ABNORMAL HIGH (ref 11.4–15.2)

## 2018-10-08 LAB — CBC
HCT: 41.2 % (ref 36.0–46.0)
Hemoglobin: 13.5 g/dL (ref 12.0–15.0)
MCH: 32 pg (ref 26.0–34.0)
MCHC: 32.8 g/dL (ref 30.0–36.0)
MCV: 97.6 fL (ref 80.0–100.0)
Platelets: 232 10*3/uL (ref 150–400)
RBC: 4.22 MIL/uL (ref 3.87–5.11)
RDW: 15.9 % — ABNORMAL HIGH (ref 11.5–15.5)
WBC: 8.1 10*3/uL (ref 4.0–10.5)
nRBC: 0 % (ref 0.0–0.2)

## 2018-10-08 LAB — TROPONIN I (HIGH SENSITIVITY)
Troponin I (High Sensitivity): 30 ng/L — ABNORMAL HIGH (ref <18)
Troponin I (High Sensitivity): 39 ng/L — ABNORMAL HIGH (ref <18)
Troponin I (High Sensitivity): 44 ng/L — ABNORMAL HIGH (ref <18)
Troponin I (High Sensitivity): 47 ng/L — ABNORMAL HIGH (ref <18)

## 2018-10-08 LAB — BRAIN NATRIURETIC PEPTIDE: B Natriuretic Peptide: 707.8 pg/mL — ABNORMAL HIGH (ref 0.0–100.0)

## 2018-10-08 MED ORDER — DILTIAZEM HCL ER COATED BEADS 120 MG PO CP24
120.0000 mg | ORAL_CAPSULE | Freq: Every day | ORAL | Status: DC
  Administered 2018-10-08: 120 mg via ORAL
  Filled 2018-10-08: qty 1

## 2018-10-08 MED ORDER — ONDANSETRON HCL 4 MG/2ML IJ SOLN: 4.0000 mg | Freq: Four times a day (QID) | INTRAMUSCULAR | Status: DC | PRN

## 2018-10-08 MED ORDER — NITROGLYCERIN 0.4 MG/HR TD PT24
0.4000 mg | MEDICATED_PATCH | Freq: Every day | TRANSDERMAL | Status: DC
  Administered 2018-10-08: 0.4 mg via TRANSDERMAL
  Filled 2018-10-08 (×2): qty 1

## 2018-10-08 MED ORDER — DOCUSATE SODIUM 100 MG PO CAPS
100.0000 mg | ORAL_CAPSULE | Freq: Two times a day (BID) | ORAL | Status: DC
  Administered 2018-10-08: 100 mg via ORAL
  Filled 2018-10-08 (×2): qty 1

## 2018-10-08 MED ORDER — NITROGLYCERIN 0.4 MG SL SUBL: 0.4000 mg | SUBLINGUAL_TABLET | SUBLINGUAL | Status: DC | PRN

## 2018-10-08 MED ORDER — FUROSEMIDE 10 MG/ML IJ SOLN
40.0000 mg | Freq: Once | INTRAMUSCULAR | Status: AC
  Administered 2018-10-08: 40 mg via INTRAVENOUS
  Filled 2018-10-08: qty 4

## 2018-10-08 MED ORDER — NITROGLYCERIN IN D5W 200-5 MCG/ML-% IV SOLN: 0.0000 ug/min | INTRAVENOUS | Status: DC

## 2018-10-08 MED ORDER — METOPROLOL TARTRATE 100 MG PO TABS
100.0000 mg | ORAL_TABLET | Freq: Two times a day (BID) | ORAL | Status: DC
  Administered 2018-10-08 – 2018-10-09 (×2): 100 mg via ORAL
  Filled 2018-10-08 (×2): qty 1

## 2018-10-08 MED ORDER — COENZYME Q-10 100 MG PO CAPS: 100.0000 mg | ORAL_CAPSULE | Freq: Every day | ORAL | Status: DC

## 2018-10-08 MED ORDER — CLOPIDOGREL BISULFATE 75 MG PO TABS
75.0000 mg | ORAL_TABLET | Freq: Every day | ORAL | Status: DC
  Administered 2018-10-08 – 2018-10-09 (×2): 75 mg via ORAL
  Filled 2018-10-08 (×2): qty 1

## 2018-10-08 MED ORDER — SODIUM CHLORIDE 0.9 % IV SOLN: 250.0000 mL | INTRAVENOUS | Status: DC | PRN

## 2018-10-08 MED ORDER — ZOLPIDEM TARTRATE 5 MG PO TABS: 5.0000 mg | ORAL_TABLET | Freq: Every evening | ORAL | Status: DC | PRN

## 2018-10-08 MED ORDER — SODIUM CHLORIDE 0.9% FLUSH: 3.0000 mL | INTRAVENOUS | Status: DC | PRN

## 2018-10-08 MED ORDER — POTASSIUM CHLORIDE CRYS ER 20 MEQ PO TBCR
20.0000 meq | EXTENDED_RELEASE_TABLET | Freq: Two times a day (BID) | ORAL | Status: DC
  Administered 2018-10-08 – 2018-10-09 (×2): 20 meq via ORAL
  Filled 2018-10-08 (×2): qty 1

## 2018-10-08 MED ORDER — SODIUM CHLORIDE 0.9% FLUSH
3.0000 mL | Freq: Two times a day (BID) | INTRAVENOUS | Status: DC
  Administered 2018-10-08 – 2018-10-09 (×2): 3 mL via INTRAVENOUS

## 2018-10-08 MED ORDER — SPIRONOLACTONE 25 MG PO TABS
25.0000 mg | ORAL_TABLET | Freq: Every day | ORAL | Status: DC
  Administered 2018-10-08 – 2018-10-09 (×2): 25 mg via ORAL
  Filled 2018-10-08 (×2): qty 1

## 2018-10-08 MED ORDER — CALCIUM-VITAMIN D 500-200 MG-UNIT PO TABS
2.0000 | ORAL_TABLET | Freq: Every evening | ORAL | Status: DC
  Administered 2018-10-08: 1 via ORAL
  Filled 2018-10-08 (×2): qty 2

## 2018-10-08 MED ORDER — ISOSORBIDE MONONITRATE ER 60 MG PO TB24
60.0000 mg | ORAL_TABLET | Freq: Every day | ORAL | Status: DC
  Administered 2018-10-09: 60 mg via ORAL
  Filled 2018-10-08: qty 1

## 2018-10-08 MED ORDER — ASPIRIN 300 MG RE SUPP
300.0000 mg | RECTAL | Status: AC
  Filled 2018-10-08: qty 1

## 2018-10-08 MED ORDER — ACETAMINOPHEN 325 MG PO TABS: 650.0000 mg | ORAL_TABLET | ORAL | Status: DC | PRN

## 2018-10-08 MED ORDER — ALPRAZOLAM 0.25 MG PO TABS: 0.2500 mg | ORAL_TABLET | Freq: Two times a day (BID) | ORAL | Status: DC | PRN

## 2018-10-08 MED ORDER — PROSIGHT PO TABS
1.0000 | ORAL_TABLET | Freq: Two times a day (BID) | ORAL | Status: DC
  Administered 2018-10-08 – 2018-10-09 (×2): 1 via ORAL
  Filled 2018-10-08 (×2): qty 1

## 2018-10-08 MED ORDER — FUROSEMIDE 10 MG/ML IJ SOLN
40.0000 mg | Freq: Two times a day (BID) | INTRAMUSCULAR | Status: DC
  Administered 2018-10-09: 40 mg via INTRAVENOUS
  Filled 2018-10-08: qty 4

## 2018-10-08 MED ORDER — DILTIAZEM HCL ER COATED BEADS 240 MG PO CP24: 240.0000 mg | ORAL_CAPSULE | Freq: Every day | ORAL | Status: DC

## 2018-10-08 MED ORDER — ATORVASTATIN CALCIUM 80 MG PO TABS: 80.0000 mg | ORAL_TABLET | Freq: Every day | ORAL | Status: DC

## 2018-10-08 MED ORDER — MEGESTROL ACETATE 40 MG PO TABS
40.0000 mg | ORAL_TABLET | Freq: Two times a day (BID) | ORAL | Status: DC
  Administered 2018-10-08 – 2018-10-09 (×2): 40 mg via ORAL
  Filled 2018-10-08 (×3): qty 1

## 2018-10-08 MED ORDER — ASPIRIN 81 MG PO CHEW
324.0000 mg | CHEWABLE_TABLET | ORAL | Status: AC
  Administered 2018-10-08: 324 mg via ORAL
  Filled 2018-10-08: qty 4

## 2018-10-08 MED ORDER — GABAPENTIN 300 MG PO CAPS
300.0000 mg | ORAL_CAPSULE | Freq: Two times a day (BID) | ORAL | Status: DC
  Administered 2018-10-08 – 2018-10-09 (×2): 300 mg via ORAL
  Filled 2018-10-08 (×2): qty 1

## 2018-10-08 NOTE — Telephone Encounter (Signed)
  Pt c/o of Chest Pain: STAT if CP now or developed within 24 hours  1. Are you having CP right now? yes  2. Are you experiencing any other symptoms (ex. SOB, nausea, vomiting, sweating)? fatigue  3. How long have you been experiencing CP? Since last night  4. Is your CP continuous or coming and going? continuous  5. Have you taken Nitroglycerin?yes, she has taken two this morning. She also took two last night.    Had some pain on left shoulder backside last night, states her jaw feels tight this morning. BP 113/63 HR 119, hx of afib ?

## 2018-10-08 NOTE — Telephone Encounter (Signed)
Spoke with pt and is complaining with CP since last night Per pt is continuous steady uncomfortable pain also notes weakness . No fever,cough, congestion or change with taste or smell Per pt has noted also HR ranging over 105 for several days B/P today was 113/63 Pt has taken 2 ntg last night ,as well as, 2 this am in addition to Isosorbide 60 mg with no change. Also, reviewed latest labs and on 8/11 TSH was 8.3  Per pt Dr Delfina Redwood rechecked few days ago and was normal. Discussed with Dr Irish Lack and pt needs to go to ED for evaluation.Pt verbalizes understanding and will cal/ EMS for pick up ./cy

## 2018-10-08 NOTE — ED Notes (Signed)
ED TO INPATIENT HANDOFF REPORT  ED Nurse Name and Phone #:  Callie Fielding [phone]  S Name/Age/Gender Patty [name] 85 y.o. female Room/Bed: 030C/030C  Code Status   Code Status: Prior  Home/SNF/Other Home Patient oriented to: self, place, time and situation Is this baseline? Yes   Triage Complete: Triage complete  Chief Complaint cp  Triage Note Per [id number] pt coming from home c/o chest pain onset of last night radiating into left arm and left jaw. Associated with shortness of breath and nausea. Patient took 5 nitro at home w/o relief. EMS administered 2 nitro with relief. Left bundle branch block for EMS. Patient has pacemaker and other cardiac history.    Allergies Allergies  Allergen Reactions  . Bee Venom Anaphylaxis  . Other Nausea And Vomiting and Other (See Comments)    Pain medications cause severe vomiting. Tolerated slow IV morphine drip  . Amiodarone Nausea Only  . Prednisone Palpitations and Other (See Comments)    "Rapid Heart Beat"    Level of Care/Admitting Diagnosis ED Disposition    ED Disposition Condition [facility] Area: [place] [[id number]]  Level of Care: Telemetry Cardiac [103]  Covid Evaluation: Asymptomatic Screening Protocol (No Symptoms)  Diagnosis: Acute on chronic combined systolic and diastolic CHF (congestive heart failure) [facility]) [id number]  Admitting Physician: Dorothy Spark [id number]  Attending Physician: Dorothy Spark [id number]  PT Class (Do Not Modify): Observation [104]  PT Acc Code (Do Not Modify): Observation [10022]       B Medical/Surgery History Past Medical History:  Diagnosis Date  . Bleeding behind the abdominal cavity 10/2017  . Chronic anticoagulation - coumadin, CHADS2VASC=6 05/17/2015  . CKD (chronic kidney disease), stage III ([place])   . Combined systolic and diastolic heart failure ([place])   . Coronary artery disease    a. s/p multiple stents - stenting x 2 to the LAD and  x 1 to the LCx in 2000, rotational arthrectomy to proximal LCx and DES to LAD in 2014 and DES to LCx in 2014, along with DES to LAD for re-in-stent stenosis in 2015, and DES to ostial LAD in 2016. b. 07/2016 - orbital atherectomy & DES to mid LCx.  . Diverticulitis   . Granulosa cell carcinoma ([place])    abd; last episode was in 2009  . Hyperlipidemia   . Hypertension   . LBBB (left bundle branch block)   . Myocardial infarction ([place]) 2002  . Obesity   . OSA on CPAP   . Ovarian ca ([place]) 2019  . Pacemaker failure    a. Prior leadless PPM with premature battery failure, being managed conservatively without replacement.  . Peripheral vascular disease ([place])   . Permanent atrial fibrillation 2013  . Renal artery stenosis ([place])   . Tachycardia-bradycardia syndrome ([place])    a. s/p leadless pacemaker ([name]) implanted by Dr [name]  . Varicose veins    Past Surgical History:  Procedure Laterality Date  . ABDOMINAL HYSTERECTOMY    . CARDIAC CATHETERIZATION  09/03/2007   EF 70%; Failed attempt at PCI to OM  . CARDIAC CATHETERIZATION  11/01/2003   EF 70%  . CARDIAC CATHETERIZATION N/A 12/14/2015   Procedure: Left Heart Cath and Coronary Angiography;  Surgeon: Burnell Blanks, MD;  Location: [place] [facility];  Service: Cardiovascular;  Laterality: N/A;  . CARDIOVERSION  12/31/2011   Procedure: CARDIOVERSION;  Surgeon: [name], MD;  Location: [place];  Service: Cardiovascular;  Laterality:  N/A;  . CARDIOVERSION N/A 12/31/2011   Procedure: CARDIOVERSION;  Surgeon: [name], MD;  Location[facility] CATH LAB;  Service: Cardiovascular;  Laterality: N/A;  . CATARACT EXTRACTION, BILATERAL  2015  . CHOLECYSTECTOMY  1980's  . COLON SURGERY  2004   colectomy for diverticulosis  . CORONARY ANGIOPLASTY WITH STENT PLACEMENT  2000    and 08/11/2012; 11/12/2012: 3 + 2 LAD & CFX; 2nd CFX stent 11/12/2012  . CORONARY ATHERECTOMY N/A 07/15/2016   Procedure: Coronary Atherectomy;   Surgeon: Martinique, Peter M, MD;  Location: [place] [facility];  Service: Cardiovascular;  Laterality: N/A;  . CORONARY BALLOON ANGIOPLASTY N/A 07/14/2016   Procedure: Coronary Balloon Angioplasty;  Surgeon: Martinique, Peter M, MD;  Location: [place] [facility];  Service: Cardiovascular;  Laterality: N/A;  . CORONARY STENT INTERVENTION N/A 07/15/2016   Procedure: Coronary Stent Intervention;  Surgeon: Martinique, Peter M, MD;  Location: [place] [facility];  Service: Cardiovascular;  Laterality: N/A;  . FRACTIONAL FLOW RESERVE WIRE  10/07/2013   Procedure: [place];  Surgeon: [name], MD;  Location: [facility] CATH LAB;  Service: Cardiovascular;;  . HERNIA REPAIR  2005   "laparoscopic"  . IR IMAGING GUIDED PORT INSERTION  10/28/2017  . LEFT HEART CATH AND CORONARY ANGIOGRAPHY N/A 07/14/2016   Procedure: Left Heart Cath and Coronary Angiography;  Surgeon: Martinique, Peter M, MD;  Location: [place] [facility];  Service: Cardiovascular;  Laterality: N/A;  . LEFT HEART CATHETERIZATION WITH CORONARY ANGIOGRAM N/A 11/12/2012   Procedure: LEFT HEART CATHETERIZATION WITH CORONARY ANGIOGRAM;  Surgeon: [name], MD;  Location: [facility] CATH LAB;  Service: Cardiovascular;  Laterality: N/A;  . LEFT HEART CATHETERIZATION WITH CORONARY ANGIOGRAM N/A 10/07/2013   Procedure: LEFT HEART CATHETERIZATION WITH CORONARY ANGIOGRAM;  Surgeon: [name], MD;  Location: [facility] CATH LAB;  Service: Cardiovascular;  Laterality: N/A;  . LEFT HEART CATHETERIZATION WITH CORONARY ANGIOGRAM N/A 12/14/2013   Procedure: LEFT HEART CATHETERIZATION WITH CORONARY ANGIOGRAM;  Surgeon: Sinclair Grooms, MD;  Location: [facility] CATH LAB;  Service: Cardiovascular;  Laterality: N/A;  . LEFT HEART CATHETERIZATION WITH CORONARY ANGIOGRAM N/A 05/16/2014   Procedure: LEFT HEART CATHETERIZATION WITH CORONARY ANGIOGRAM;  Surgeon: Sherren Mocha, MD;  Location: [facility] CATH LAB;  Service: Cardiovascular;  Laterality: N/A;  . PERCUTANEOUS  CORONARY INTERVENTION-BALLOON ONLY  08/04/2012   Procedure: PERCUTANEOUS CORONARY INTERVENTION-BALLOON ONLY;  Surgeon: [name], MD;  Location: [facility] CATH LAB;  Service: Cardiovascular;;  . PERCUTANEOUS CORONARY ROTOBLATOR INTERVENTION (PCI-R) N/A 08/11/2012   Procedure: PERCUTANEOUS CORONARY ROTOBLATOR INTERVENTION (PCI-R);  Surgeon: [name], MD;  Location: [facility] CATH LAB;  Service: Cardiovascular;  Laterality: N/A;  . PERCUTANEOUS CORONARY STENT INTERVENTION (PCI-S)  10/07/2013   Procedure: PERCUTANEOUS CORONARY STENT INTERVENTION (PCI-S);  Surgeon: [name], MD;  Location: [facility] CATH LAB;  Service: Cardiovascular;;  . PERMANENT PACEMAKER INSERTION N/A 03/16/2012   [name] (SJM) leadless pacemaker (LEADLESS II STUDY PATEINT)  . SALIVARY GLAND SURGERY  2000's   "had a little lump removed; granulosa related; it was benign" (08/11/2012)  . TEE WITHOUT CARDIOVERSION  12/31/2011   Procedure: TRANSESOPHAGEAL ECHOCARDIOGRAM (TEE);  Surgeon: [name], MD;  Location: [facility] ENDOSCOPY;  Service: Cardiovascular;  Laterality: N/A;  . UMBILICAL GRANULOMA EXCISION  2000   2003; 2004; 2007: "all in my abdomen including small intestines, outside my ?uterus/etc" (08/11/2012)  . VARICOSE VEIN SURGERY Bilateral 1977     A IV Location/Drains/Wounds Patient Lines/Drains/Airways Status   Active Line/Drains/Airways  Name:   Placement date:   Placement time:   Site:   Days:   Implanted Port 10/28/17 Right Chest   10/28/17    1248    Chest   345   Peripheral IV 10/08/18 Right Hand   10/08/18    1600    Hand   less than 1   Incision (Closed) 10/28/17 Neck Right   10/28/17    1300     345          Intake/Output Last 24 hours No intake or output data in the 24 hours ending 10/08/18 1842  Labs/Imaging Results for orders placed or performed during the hospital encounter of 10/08/18 (from the past 48 hour(s))  Basic metabolic panel     Status: Abnormal   Collection Time: 10/08/18   1:46 PM  Result Value Ref Range   Sodium 140 135 - 145 mmol/L   Potassium 3.6 3.5 - 5.1 mmol/L   Chloride 106 98 - 111 mmol/L   CO2 24 22 - 32 mmol/L   Glucose, Bld 186 (H) 70 - 99 mg/dL   BUN 24 (H) 8 - 23 mg/dL   Creatinine, Ser 1.12 (H) 0.44 - 1.00 mg/dL   Calcium 9.0 8.9 - 10.3 mg/dL   GFR calc non Af Amer 44 (L) >60 mL/min   GFR calc Af Amer 52 (L) >60 mL/min   Anion gap 10 5 - 15    Comment: Performed at [facility], [address] [place], [place] [address]  CBC     Status: Abnormal   Collection Time: 10/08/18  1:46 PM  Result Value Ref Range   WBC 8.1 4.0 - 10.5 K/uL   RBC 4.22 3.87 - 5.11 MIL/uL   Hemoglobin 13.5 12.0 - 15.0 g/dL   HCT 41.2 36.0 - 46.0 %   MCV 97.6 80.0 - 100.0 fL   MCH 32.0 26.0 - 34.0 pg   MCHC 32.8 30.0 - 36.0 g/dL   RDW 15.9 (H) 11.5 - 15.5 %   Platelets 232 150 - 400 K/uL   nRBC 0.0 0.0 - 0.2 %    Comment: Performed at [facility], [place] [address] [place], [place] [address]  Troponin I (High Sensitivity)     Status: Abnormal   Collection Time: 10/08/18  1:46 PM  Result Value Ref Range   Troponin I (High Sensitivity) 30 (H) <18 ng/L    Comment: (NOTE) Elevated high sensitivity troponin I (hsTnI) values and significant  changes across serial measurements may suggest ACS but many other  chronic and acute conditions are known to elevate hsTnI results.  Refer to the "Links" section for chest pain algorithms and additional  guidance. Performed at [facility], [place] [address] [place], [place] [address]   Protime-INR     Status: Abnormal   Collection Time: 10/08/18  2:00 PM  Result Value Ref Range   Prothrombin Time 40.1 (H) 11.4 - 15.2 seconds   INR 4.2 (HH) 0.8 - 1.2    Comment: REPEATED TO VERIFY CRITICAL RESULT CALLED TO, READ BACK BY AND VERIFIED WITH: K RAND RN [id number] BY A BENNETT (NOTE) INR goal varies based on device and disease states. Performed at [facility], [place] [address], [place],  [place] [address]   Brain natriuretic peptide     Status: Abnormal   Collection Time: 10/08/18  2:07 PM  Result Value Ref Range   B Natriuretic Peptide 707.8 (H) 0.0 - 100.0  pg/mL    Comment: Performed at [facility], [place] [address] [place], [place] [address]  Troponin I (High Sensitivity)     Status: Abnormal   Collection Time: 10/08/18  3:46 PM  Result Value Ref Range   Troponin I (High Sensitivity) 39 (H) <18 ng/L    Comment: (NOTE) Elevated high sensitivity troponin I (hsTnI) values and significant  changes across serial measurements may suggest ACS but many other  chronic and acute conditions are known to elevate hsTnI results.  Refer to the "Links" section for chest pain algorithms and additional  guidance. Performed at [facility], [place] [address] [place], [place] [address]    *Note: Due to a large number of results and/or encounters for the requested time period, some results have not been displayed. A complete set of results can be found in Results Review.   Dg Chest Port 1 View  Result Date: 10/08/2018 CLINICAL DATA:  Chest pain EXAM: PORTABLE CHEST 1 VIEW COMPARISON:  11/03/2017 FINDINGS: Left-sided pacing device as before. Cardiomegaly with vascular congestion. Right-sided central venous port tip over the SVC. Suspected small left effusion. Hazy airspace disease at the bases. Aortic atherosclerosis. No pneumothorax. IMPRESSION: 1. Cardiomegaly with vascular congestion. 2. Probable small left effusion. Hazy atelectasis or edema at both lung bases. Electronically Signed   By: Donavan Foil M.D.   On: 10/08/2018 14:59    Pending Labs [employer] (From admission, onward)    Start     Ordered   Signed and Held  Occult blood card to lab, stool  As needed,   R     Signed and Held   Signed and Held  Comprehensive metabolic panel  Tomorrow morning,   R     Signed and Held   Signed and Held  TSH  Once,   R     Signed and Held   Signed and Held  Protime-INR  Tomorrow  morning,   R     Signed and Held          Vitals/Pain Today's Vitals   10/08/18 1345 10/08/18 1430 10/08/18 1500 10/08/18 1600  BP: (!) 145/93 (!) 145/64 (!) 151/95 (!) 159/71  Pulse: 66 77  75  Resp: 18 (!) 23 (!) 35 (!) 27  Temp: 98 F (36.7 C)     TempSrc: Oral     SpO2: 98% 98%  93%  Weight:      Height:      PainSc:        Isolation Precautions No active isolations  Medications Medications  nitroGLYCERIN (NITRODUR - Dosed in mg/24 hr) patch 0.4 mg (0.4 mg Transdermal Patch Applied 10/08/18 1749)  atorvastatin (LIPITOR) tablet 80 mg (has no administration in time range)  beta carotene w/minerals (OCUVITE) tablet 1 tablet (has no administration in time range)  clopidogrel (PLAVIX) tablet 75 mg (has no administration in time range)  Calcium Carb-Cholecalciferol 600-800 MG-UNIT TABS 1 tablet (has no administration in time range)  diltiazem (CARDIZEM CD) 24 hr capsule 120 mg (has no administration in time range)  isosorbide mononitrate (IMDUR) 24 hr tablet 60 mg (has no administration in time range)  gabapentin (NEURONTIN) capsule 300 mg (has no administration in time range)  docusate sodium (COLACE) capsule 100 mg (has no administration in time range)  diltiazem (CARDIZEM CD) 24 hr capsule 240 mg (has no administration in time range)  potassium chloride SA (K-DUR) CR tablet 20 mEq (has no administration in time range)  megestrol (MEGACE)  tablet 40 mg (has no administration in time range)  metoprolol tartrate (LOPRESSOR) tablet 100 mg (has no administration in time range)  spironolactone (ALDACTONE) tablet 25 mg (has no administration in time range)  furosemide (LASIX) injection 40 mg (has no administration in time range)  furosemide (LASIX) injection 40 mg (40 mg Intravenous Given 10/08/18 1601)    Mobility walks Low fall risk   Focused Assessments Cardiac Assessment Handoff:    Lab Results  Component Value Date   CKTOTAL 69 09/14/2018   CKMB 2.6 06/08/2010    TROPONINI 0.07 ([place]) 11/05/2017   No results found for: DDIMER Does the Patient currently have chest pain? No     R Recommendations: See Admitting Provider Note  Report given to:   Additional Notes:  Pt is alert oriented x 4 no chest pain at this time

## 2018-10-08 NOTE — H&P (Addendum)
Cardiology History and Physical:   Patient ID: ERICE BAHN; [id number]; 1930-10-15   Admit date: 10/08/2018 Date of Consult: 10/08/2018  Primary Care Provider: [name], MD Primary Cardiologist: Larae Grooms, MD 09/16/2018 Primary Electrophysiologist:  Thompson Grayer, MD 05/11/2017   Patient Profile:   Patty [name] is a 85 y.o. female with a hx of DES x 2 LAD & DES CFX 2000, HSRA CFX & DES LAD 2014, DES CFX 2014, DES LAD for ISR 2015, DES oLAD 2016, HSRA & DES mCFX 2018, S-D-CHF, LBBB, pre-DM, HTN, HLD, perm Afib w/ tachy/brady s/p [name] leadless PPM (but w/ premature battery failure)>>STJ dual-lead PPM, obesity, OSA on CPAP, ovarian CA 2019, CKD III, who is being seen today for the evaluation of chest pain at the request of Dr Francia Greaves.  History of Present Illness:   Patty [name] had been having problems for over a month. More chest pressure than usual. She is waking with it 2-3 x week, she gets up, takes her meds and gets moving. The sx resolve.   For the last week, the DOE has been worse, she gets SOB walking room-room.   About 3 weeks ago, she had gone from 169>>178. She had noticed more problems breathing. She called the office and her Lasix was changed from 80 mg qd to 40 mg bid and spiro 25 mg qd (had been occasionally). The weight came down to 171, increased urination. Her weight went up to 172 lbs or so, but the DOE continued to get worse.   Uses CPAP so no PND, but gets up to BR.   She has been really tired for several months. She finished chemo in February, but has continued to be tired. She has felt drained by ADLs.   The chest pressure reaches 8/10, that is the worst it has been in a long time. She has taken multiple nitro tabs. She has had 2 last pm, 2 this am, and EMS gave her 2 more. The chest pain is responsive to nitrates.    Past Medical History:  Diagnosis Date   Bleeding behind the abdominal cavity 10/2017   Chronic anticoagulation - coumadin,  CHADS2VASC=6 05/17/2015   CKD (chronic kidney disease), stage III ([name])    Combined systolic and diastolic heart failure ([name])    Coronary artery disease    a. s/p multiple stents - stenting x 2 to the LAD and x 1 to the LCx in 2000, rotational arthrectomy to proximal LCx and DES to LAD in 2014 and DES to LCx in 2014, along with DES to LAD for re-in-stent stenosis in 2015, and DES to ostial LAD in 2016. b. 07/2016 - orbital atherectomy & DES to mid LCx.   Diverticulitis    Granulosa cell carcinoma ([place])    abd; last episode was in 2009   Hyperlipidemia    Hypertension    LBBB (left bundle branch block)    Myocardial infarction ([place]) 2002   Obesity    OSA on CPAP    Ovarian ca [facility]) 2019   Pacemaker failure    a. Prior leadless PPM with premature battery failure, being managed conservatively without replacement.   Peripheral vascular disease ([place])    Permanent atrial fibrillation 2013   Renal artery stenosis ([name])    Tachycardia-bradycardia syndrome ([place])    a. s/p leadless pacemaker ([name]) implanted by Dr [name]   Varicose veins     Past Surgical History:  Procedure Laterality Date   ABDOMINAL HYSTERECTOMY     CARDIAC  CATHETERIZATION  09/03/2007   EF 70%; Failed attempt at PCI to OM   CARDIAC CATHETERIZATION  11/01/2003   EF 70%   CARDIAC CATHETERIZATION N/A 12/14/2015   Procedure: Left Heart Cath and Coronary Angiography;  Surgeon: Burnell Blanks, MD;  Location: [place] CV LAB;  Service: Cardiovascular;  Laterality: N/A;   CARDIOVERSION  12/31/2011   Procedure: CARDIOVERSION;  Surgeon: [name], MD;  Location: [facility] ENDOSCOPY;  Service: Cardiovascular;  Laterality: N/A;   CARDIOVERSION N/A 12/31/2011   Procedure: CARDIOVERSION;  Surgeon: [name], MD;  Location: [facility] CATH LAB;  Service: Cardiovascular;  Laterality: N/A;   CATARACT EXTRACTION, BILATERAL  2015   CHOLECYSTECTOMY  1980's   COLON SURGERY  2004   colectomy  for diverticulosis   CORONARY ANGIOPLASTY WITH STENT PLACEMENT  2000    and 08/11/2012; 11/12/2012: 3 + 2 LAD & CFX; 2nd CFX stent 11/12/2012   CORONARY ATHERECTOMY N/A 07/15/2016   Procedure: Coronary Atherectomy;  Surgeon: Martinique, Peter M, MD;  Location: [place] CV LAB;  Service: Cardiovascular;  Laterality: N/A;   CORONARY BALLOON ANGIOPLASTY N/A 07/14/2016   Procedure: Coronary Balloon Angioplasty;  Surgeon: Martinique, Peter M, MD;  Location: [facility] CV LAB;  Service: Cardiovascular;  Laterality: N/A;   CORONARY STENT INTERVENTION N/A 07/15/2016   Procedure: Coronary Stent Intervention;  Surgeon: Martinique, Peter M, MD;  Location: [place] CV LAB;  Service: Cardiovascular;  Laterality: N/A;   FRACTIONAL FLOW RESERVE WIRE  10/07/2013   Procedure: FRACTIONAL FLOW RESERVE WIRE;  Surgeon: [name], MD;  Location: [facility] CATH LAB;  Service: Cardiovascular;;   HERNIA REPAIR  2005   "laparoscopic"   IR IMAGING GUIDED PORT INSERTION  10/28/2017   LEFT HEART CATH AND CORONARY ANGIOGRAPHY N/A 07/14/2016   Procedure: Left Heart Cath and Coronary Angiography;  Surgeon: Martinique, Peter M, MD;  Location: [place] CV LAB;  Service: Cardiovascular;  Laterality: N/A;   LEFT HEART CATHETERIZATION WITH CORONARY ANGIOGRAM N/A 11/12/2012   Procedure: LEFT HEART CATHETERIZATION WITH CORONARY ANGIOGRAM;  Surgeon: [name], MD;  Location: [facility] CATH LAB;  Service: Cardiovascular;  Laterality: N/A;   LEFT HEART CATHETERIZATION WITH CORONARY ANGIOGRAM N/A 10/07/2013   Procedure: LEFT HEART CATHETERIZATION WITH CORONARY ANGIOGRAM;  Surgeon: [name], MD;  Location: [facility] CATH LAB;  Service: Cardiovascular;  Laterality: N/A;   LEFT HEART CATHETERIZATION WITH CORONARY ANGIOGRAM N/A 12/14/2013   Procedure: LEFT HEART CATHETERIZATION WITH CORONARY ANGIOGRAM;  Surgeon: Sinclair Grooms, MD;  Location: [facility] CATH LAB;  Service: Cardiovascular;  Laterality: N/A;   LEFT HEART CATHETERIZATION WITH  CORONARY ANGIOGRAM N/A 05/16/2014   Procedure: LEFT HEART CATHETERIZATION WITH CORONARY ANGIOGRAM;  Surgeon: Sherren Mocha, MD;  Location: [facility] CATH LAB;  Service: Cardiovascular;  Laterality: N/A;   PERCUTANEOUS CORONARY INTERVENTION-BALLOON ONLY  08/04/2012   Procedure: PERCUTANEOUS CORONARY INTERVENTION-BALLOON ONLY;  Surgeon: [name], MD;  Location: [facility] CATH LAB;  Service: Cardiovascular;;   PERCUTANEOUS CORONARY ROTOBLATOR INTERVENTION (PCI-R) N/A 08/11/2012   Procedure: PERCUTANEOUS CORONARY ROTOBLATOR INTERVENTION (PCI-R);  Surgeon: [name], MD;  Location: [facility] CATH LAB;  Service: Cardiovascular;  Laterality: N/A;   PERCUTANEOUS CORONARY STENT INTERVENTION (PCI-S)  10/07/2013   Procedure: PERCUTANEOUS CORONARY STENT INTERVENTION (PCI-S);  Surgeon: [name], MD;  Location: [facility] CATH LAB;  Service: Cardiovascular;;   PERMANENT PACEMAKER INSERTION N/A 03/16/2012   [name] (SJM) leadless pacemaker (LEADLESS II STUDY PATEINT)   SALIVARY GLAND SURGERY  2000's   "had a little lump  removed; granulosa related; it was benign" (08/11/2012)   TEE WITHOUT CARDIOVERSION  12/31/2011   Procedure: TRANSESOPHAGEAL ECHOCARDIOGRAM (TEE);  Surgeon: [name], MD;  Location: [facility] ENDOSCOPY;  Service: Cardiovascular;  Laterality: N/A;   UMBILICAL GRANULOMA EXCISION  2000   2003; 2004; 2007: "all in my abdomen including small intestines, outside my ?uterus/etc" (08/11/2012)   VARICOSE VEIN SURGERY Bilateral 1977     Prior to Admission medications   Medication Sig Start Date End Date Taking? Authorizing Provider  acetaminophen (TYLENOL) 500 MG tablet Take 500 mg by mouth every 6 (six) hours as needed.    [provider]  atorvastatin (LIPITOR) 80 MG tablet TAKE 1 TABLET BY MOUTH EVERY DAY IN THE MORNING 04/07/18   [name], MD  beta carotene w/minerals (OCUVITE) tablet Take 1 tablet by mouth 2 (two) times daily.     [provider]  Calcium  Carb-Cholecalciferol (CALTRATE 600+D) 600-800 MG-UNIT TABS Take 1 tablet by mouth every evening.     [provider]  clopidogrel (PLAVIX) 75 MG tablet Take 1 tablet (75 mg total) by mouth daily. 02/23/18   [name], MD  Coenzyme Q-10 100 MG capsule Take 100-200 mg by mouth daily.     [provider]  diltiazem (CARDIZEM CD) 120 MG 24 hr capsule Take 1 capsule (120 mg total) by mouth at bedtime. 09/07/18   [name], MD  diltiazem [facility] CD) 240 MG 24 hr capsule TAKE ONE CAPSULE BY MOUTH DAILY WITH BREAKFAST 08/31/18   [name], MD  docusate sodium (COLACE) 100 MG capsule Take 100 mg by mouth 2 (two) times daily.    [provider]  furosemide (LASIX) 40 MG tablet Take 1 tablet (40 mg total) by mouth 2 (two) times daily. 09/16/18   [name], MD  gabapentin (NEURONTIN) 300 MG capsule TAKE 1 CAPSULE BY MOUTH TWICE A DAY 08/02/18   Heath Lark, MD  isosorbide mononitrate (IMDUR) 60 MG 24 hr tablet Take 1 tablet (60 mg total) by mouth daily. 02/19/18   [name], MD  KLOR-CON M20 20 MEQ tablet TAKE 1 TABLET BY MOUTH EVERY DAY 07/28/18   [name], MD  lidocaine-prilocaine (EMLA) cream Apply to [facility] site 1-2 hours prior to access 09/30/18   Cross, Lenna Sciara D, NP  megestrol (MEGACE) 40 MG tablet Take 1 tablet (40 mg total) by mouth 2 (two) times daily. Patient not taking: Reported on 09/16/2018 09/06/18   Everitt Amber, MD  metoprolol tartrate (LOPRESSOR) 100 MG tablet TAKE 1 TABLET BY MOUTH TWICE A DAY 08/31/18   [name], MD  Multiple Vitamin (MULTIVITAMIN) capsule Take 1 capsule by mouth daily.    [provider]  nitroGLYCERIN (NITROSTAT) 0.4 MG SL tablet PLACE 1 TABLET (0.4 MG TOTAL) UNDER THE TONGUE EVERY 5 (FIVE) MINUTES AS NEEDED. FOR CHEST PAIN 08/31/17   Thompson Grayer, MD  NON FORMULARY Place 1 each into the nose at bedtime. CPAP setting 3.5    [provider]  Omega-3 Fatty Acids (FISH  OIL) 1200 MG CAPS Take 1,200 mg by mouth daily.     [provider]  Probiotic Product (PROBIOTIC-10 PO) Take 1 capsule by mouth daily.    [provider]  spironolactone (ALDACTONE) 25 MG tablet Take 25 mg by mouth daily as needed.    [provider]  warfarin (COUMADIN) 2 MG tablet TAKE 1 TABLET DAILY EXCEPT 1/2 TABLET ON MONDAYS AND FRIDAYS OR AS DIRECTED BY  COUMADIN CLINIC. 08/02/18   [name], MD    Inpatient Medications: Scheduled Meds:  nitroGLYCERIN  0.4 mg Transdermal Daily   Continuous Infusions:  PRN Meds:   Allergies:    Allergies  Allergen Reactions   Bee Venom Anaphylaxis   Other Nausea And Vomiting and Other (See Comments)    Pain medications cause severe vomiting. Tolerated slow IV morphine drip   Amiodarone Nausea Only   Prednisone Other (See Comments)    "Rapid Heart Beat"    Social History:   Social History   Socioeconomic History   Marital status: Widowed    Spouse name: Not on file   Number of children: 4   Years of education: Not on file   Highest education level: Not on file  Occupational History   Occupation: Retired Pharmacist, hospital and Medical sales representative strain: Not on file   Food insecurity    Worry: Not on file    Inability: Not on file   Transportation needs    Medical: Not on file    Non-medical: Not on file  Tobacco Use   Smoking status: Former Smoker    Packs/day: 1.00    Years: 32.00    Pack years: 32.00    Types: Cigarettes    Quit date: 02/03/1974    Years since quitting: 44.7   Smokeless tobacco: Never Used  Substance and Sexual Activity   Alcohol use: Yes    Alcohol/week: 5.0 standard drinks    Types: 5 Glasses of wine per week    Comment: occ   Drug use: No   Sexual activity: Never  Lifestyle   Physical activity    Days per week: Not on file    Minutes per session: Not on file   Stress: Not on file  Relationships   Social connections     Talks on phone: Not on file    Gets together: Not on file    Attends religious service: Not on file    Active member of club or organization: Not on file    Attends meetings of clubs or organizations: Not on file    Relationship status: Not on file   Intimate partner violence    Fear of current or ex partner: Not on file    Emotionally abused: Not on file    Physically abused: Not on file    Forced sexual activity: Not on file  Other Topics Concern   Not on file  Social History Narrative   Lives alone.    Family History:   Family History  Problem Relation Age of Onset   Stroke Mother    Heart attack Father    Diabetes Father    Hypertension Father    Heart attack Brother    Diabetes Brother    Hypertension Brother    Kidney failure Brother    Family Status:  Family Status  Relation Name Status   Mother  Deceased at age 50   Father  Deceased at age 97   MGM  Deceased   MGF  Deceased   Lorraine  Deceased   PGF  Deceased   Brother  (Not Specified)   Brother  (Not Specified)   Brother  (Not Specified)   Brother  (Not Specified)    ROS:  Please see the history of present illness.  All other ROS reviewed and negative.     Physical Exam/Data:   Vitals:   10/08/18 1345 10/08/18 1430 10/08/18  1500 10/08/18 1600  BP: (!) 145/93 (!) 145/64 (!) 151/95 (!) 159/71  Pulse: 66 77  75  Resp: 18 (!) 23 (!) 35 (!) 27  Temp: 98 F (36.7 C)     TempSrc: Oral     SpO2: 98% 98%  93%  Weight:      Height:       No intake or output data in the 24 hours ending 10/08/18 1717 Filed Weights   10/08/18 1340  Weight: 78.3 kg   Body mass index is 33.71 kg/m.  General:  Well nourished, well developed, female in no acute distress HEENT: normal Lymph: no adenopathy Neck: JVD - 10 cm, +HJR Endocrine:  No thryomegaly Vascular: No carotid bruits; 4/4 extremity pulses 2+, without bruits  Cardiac:  normal S1, S2; Irreg R&R; soft murmur  Lungs:  Rales bases, no  wheezing, rhonchi Abd: soft, nontender, no hepatomegaly  Ext: no edema Musculoskeletal:  No deformities, BUE and BLE strength normal and equal Skin: warm and dry  Neuro:  CNs 2-12 intact, no focal abnormalities noted Psych:  Normal affect   EKG:  The EKG was personally reviewed and demonstrates:  Atrial flutter vs coarse Afib, HR 94. LBBB is old Telemetry:  Telemetry was personally reviewed and demonstrates:  Atrial fib/flutter   CV studies:   ECHO: 10/14/2017 - Left ventricle: The cavity size was normal. Wall thickness was   increased in a pattern of moderate LVH. Septal-lateral   dyssynchrony. Systolic function was mildly to moderately reduced.   The estimated ejection fraction was in the range of 40% to 45%.   Diffuse hypokinesis. Features are consistent with a pseudonormal   left ventricular filling pattern, with concomitant abnormal   relaxation and increased filling pressure (grade 2 diastolic   dysfunction). - Aortic valve: Trileaflet; moderately calcified leaflets. There   was no stenosis. There was mild regurgitation. - Aorta: Mildly dilated ascending aorta. Ascending aortic diameter:   39 mm (S). - Mitral valve: Moderately calcified annulus. There was mild   regurgitation. - Left atrium: The atrium was moderately dilated. - Right ventricle: The cavity size was normal. Pacer wire or   catheter noted in right ventricle. Systolic function was normal. - Right atrium: The atrium was mildly dilated. - Tricuspid valve: Peak RV-RA gradient (S): 33 mm Hg. - Pulmonary arteries: PA peak pressure: 36 mm Hg (S). - Inferior vena cava: The vessel was normal in size. The   respirophasic diameter changes were in the normal range (>= 50%),   consistent with normal central venous pressure. - Pericardium, extracardiac: A trivial pericardial effusion was   identified.  Impressions:  - Nomral LV size with moderate LV hypertrophy. EF 40-45%, diffuse   hypokinesis with septal-lateral  dyssynchrony consistent with LBBB   or RV pacing. Moderate diastolic dysfunction. Normal RV size and   systolic function. Mild MR, mild AI. Mild pulmonary hypertension.  PCI: 07/15/2016  Mid Cx lesion, 90 %stenosed.  A STENT PROMUS PREM MR 2.5X12 drug eluting stent was successfully placed.  Post intervention, there is a 0% residual stenosis.   1. Successful orbital atherectomy and stenting of the distal LCx with DES  Plan: continue DAPT with ASA/Plavix. Would discontinue ASA at one month. Resume Coumadin tonight. I would not bridge with IV heparin. Anticipate DC in am. Intervention     CATH: 07/14/2016  Prox RCA lesion, 30 %stenosed.  Mid RCA-2 lesion, 30 %stenosed.  Mid RCA-1 lesion, 40 %stenosed.  Dist RCA lesion/PLOM, 100 %stenosed.  Ost RPDA  to RPDA lesion, 50 %stenosed.  Ost Cx to Prox Cx lesion, 10 %stenosed.  Ost 1st Mrg to 1st Mrg lesion, 100 %stenosed.  Ost LAD to Mid LAD lesion, 20 %stenosed.  Acute Mrg lesion, 30 %stenosed.  Ost 2nd Mrg to 2nd Mrg lesion, 60 %stenosed.  Mid LAD lesion, 30 %stenosed.  LV end diastolic pressure is normal.  Mid Cx lesion, 90 %stenosed.  Post intervention, there is a 90% residual stenosis.   1. 2 vessel obstructive CAD    - 100% CTO of OM1 with left to left collaterals.    - 90% distal LCx    - 100% distal PLOM 2. Normal LVEDP 3. Unsuccessful PCI of the distal LCx due to inability to cross the lesion with a balloon.   Plan: will resume IV heparin this pm. Plan to return to cath lab for atherectomy and stenting of the distal LCx.  Intervention     Laboratory Data:   Chemistry Recent Labs  Lab 10/08/18 1346  NA 140  K 3.6  CL 106  CO2 24  GLUCOSE 186*  BUN 24*  CREATININE 1.12*  CALCIUM 9.0  GFRNONAA 44*  GFRAA 52*  ANIONGAP 10    Lab Results  Component Value Date   ALT 69 (H) 09/14/2018   AST 53 (H) 09/14/2018   ALKPHOS 136 (H) 09/14/2018   BILITOT 0.7 09/14/2018   Hematology Recent Labs   Lab 10/08/18 1346  WBC 8.1  RBC 4.22  HGB 13.5  HCT 41.2  MCV 97.6  MCH 32.0  MCHC 32.8  RDW 15.9*  PLT 232   Cardiac Enzymes High Sensitivity Troponin:   Recent Labs  Lab 10/08/18 1346 10/08/18 1546  TROPONINIHS 30* 39*     BNP Recent Labs  Lab 10/08/18 1407  BNP 707.8*    TSH:  Lab Results  Component Value Date   TSH 8.300 (H) 09/14/2018   Lipids: Lab Results  Component Value Date   CHOL 132 10/08/2017   HDL 66 10/08/2017   LDLCALC 52 10/08/2017   TRIG 68 10/08/2017   CHOLHDL 2.0 10/08/2017   HgbA1c: Lab Results  Component Value Date   HGBA1C 6.0 (H) 09/14/2018   Magnesium:  Magnesium  Date Value Ref Range Status  12/11/2017 1.7 1.7 - 2.4 mg/dL Final    Comment:    Performed at [facility], [place] [address] [place], [place] [address]   Lab Results  Component Value Date   INR 4.2 (HH) 10/08/2018   INR 3.0 09/13/2018   INR 2.6 08/23/2018     Radiology/Studies:  Dg Chest Port 1 View  Result Date: 10/08/2018 CLINICAL DATA:  Chest pain EXAM: PORTABLE CHEST 1 VIEW COMPARISON:  11/03/2017 FINDINGS: Left-sided pacing device as before. Cardiomegaly with vascular congestion. Right-sided central venous port tip over the SVC. Suspected small left effusion. Hazy airspace disease at the bases. Aortic atherosclerosis. No pneumothorax. IMPRESSION: 1. Cardiomegaly with vascular congestion. 2. Probable small left effusion. Hazy atelectasis or edema at both lung bases. Electronically Signed   By: Donavan Foil M.D.   On: 10/08/2018 14:59    Assessment and Plan:   1. Acute combined CHF - Admit, diurese, if pt does not respond to Lasix 40 mg IV bid, increase, pt says has urinated since first dose. - ck echo, EF read as NL 2018>>40-45% 2019>>2020? - follow I/O, daily wts, renal function - continue spiro as well - if good response to diuresis, may be able to d/c in am  2. USAP - continue to cycle ez - has substrate for angina w/ CHF,  may be 2nd to this - may also have ISR or progression of medically treated dz - avoid IV nitrates w/ CHF, continue Imdur as long as pain controlled - see how sx improve w/ diuresis, may need cath (R/L?) on Tuesday - continue Plavix, BB, statin  3. HTN - BP elevated now, follow w/ diuresis  4. Perm afib, RVR at times - hx tachy/brady, s/p STJ dual-lead PPM 2018 after leadless PPM had premature battery failure - according to remote check 08/2018, Vpacing about 40% of the time - has been in Afib since April - she is on metop 100 mg bid and Cardizem 120 mg qd - may need to stop Cardizem if EF lower  5. Chronic anticoag - INR supratherapeutic today - no bleeding issues - hold coumadin tonight, if able to d/c tomorrow, restart   Otherwise, continue current rx Principal Problem:   Acute on chronic combined systolic (congestive) and diastolic (congestive) heart failure ([name]) Active Problems:   Unstable angina ([place])   Essential hypertension   Permanent atrial fibrillation with RVR   Warfarin anticoagulation  For questions or updates, please contact [facility] Please consult [email or web address] for contact info under Cardiology/STEMI.   Signed, Rosaria Ferries, PA-C  10/08/2018 5:17 PM  The patient was seen, examined and discussed with Rosaria Ferries, PA-C and I agree with the above.   A very pleasant 85 year old female with prior medical history of coronary artery disease and placement of several stents (DES x 2 LAD & DES CFX 2000, HSRA CFX & DES LAD 2014, DES CFX 2014, DES LAD for ISR 2015, DES oLAD 2016, HSRA & DES mCFX 2018), history of chronic combined systolic diastolic CHF, previously normal LVEF in 2018 but in 2019 with 40 to 45% LVEF, the patient also has history of paroxysmal atrial fibrillation and atrial flutter on chronic anticoagulation with Coumadin and history of tachybradycardia syndrome status post pacemaker placement, history of ovarian cancer 2019 treated with chemo that  led to peripheral neuropathy.  She was doing well until 2 weeks ago when she developed progressively worsening dyspnea on exertion associated with chest pressure, this morning she felt short of breath at rest, and she called EMS.  She has been compliant with her medications, she received Lasix 40 in the ER with some improvement of her symptoms.  On physical exam she is lying comfortably in 30 degrees position, she has no JVDs, she has mild crackles at lung bases, S1-S2, no edema in her lower extremities that are fairly warm.  Labs include creatinine of 1.1, baseline 1.0, potassium 3.6, BNP 707 troponin 30, 39, hemoglobin 13.5, platelets 232, INR 4.2.  TSH From 09/14/2018 - 8.3.   Assessment and plan: Acute on chronic combined systolic diastolic CHF Typical angina suspicious for unstable angina  -We will give another dose of Lasix 40 mg tonight and reassess in the morning, the patient feels significantly better with just first dose of Lasix, her symptoms are suspicious for unstable angina but this is possibly also presentation of CHF, given the fact that this is a long weekend and she has family visiting her and unable to see her in the hospital, if she feels significantly better in the morning she can potentially be discharged and evaluated by an outpatient stress test next week.  Otherwise if she continues to have exertional chest pains once she is diuresed she would have to wait for cardiac catheterization on  Tuesday. We will hold her Coumadin tonight as her INR is 4.2 however if we decide not to cath we should restart tomorrow.  Ena Dawley, MD 10/08/2018

## 2018-10-08 NOTE — ED Provider Notes (Signed)
[facility] EMERGENCY DEPARTMENT Provider Note   CSN: [id number] Arrival date & time: 10/08/18  1335     History   Chief Complaint Chief Complaint  Patient presents with  . Chest Pain    HPI Patty [name] is a 85 y.o. female.     HPI  85 yo female ho chronic anticoagulation for afib/aflutter, chf, cad ho multiple stents presents toay with complaints of chest pain that began last night before midnight.  Initially woke her from sleep.  She took 2 nitroglycerin and it decreased enough that she was able to go back to sleep.  She describes the pain at that time is 5 out of 10.  She woke this morning around 5 AM and again had some chest pressure and took nitroglycerin.  She was able go back to sleep until 830.  After she was awake and moving around her chest pain worsened.  She states that she took her morning medications.  The pain is sharp and worse with deep breath but also described as pressure in the substernal area.  She does have a pacemaker in place.  She also has a right port which she formerly received chemotherapy but has not had any since February for a granulosa cell tumor.  Past Medical History:  Diagnosis Date  . Bleeding behind the abdominal cavity 10/2017  . Chronic anticoagulation - coumadin, CHADS2VASC=6 05/17/2015  . CKD (chronic kidney disease), stage III ([place])   . Combined systolic and diastolic heart failure ([place])   . Coronary artery disease    a. s/p multiple stents - stenting x 2 to the LAD and x 1 to the LCx in 2000, rotational arthrectomy to proximal LCx and DES to LAD in 2014 and DES to LCx in 2014, along with DES to LAD for re-in-stent stenosis in 2015, and DES to ostial LAD in 2016. b. 07/2016 - orbital atherectomy & DES to mid LCx.  . Diverticulitis   . Granulosa cell carcinoma ([place])    abd; last episode was in 2009  . Hyperlipidemia   . Hypertension   . LBBB (left bundle branch block)       . Myocardial infarction ([place]) 2002  . Obesity    . OSA on CPAP   . Ovarian ca ([name]) 2019  . Pacemaker failure    a. Prior leadless PPM with premature battery failure, being managed conservatively without replacement.  . Peripheral vascular disease ([place])   . Permanent atrial fibrillation 2013  . Renal artery stenosis ([place])   . Tachycardia-bradycardia syndrome ([place])    a. s/p leadless pacemaker ([name]) implanted by Dr [name]  . Varicose veins     Patient Active Problem List   Diagnosis Date Noted  . Peripheral neuropathy due to chemotherapy ([place]) 11/19/2017  . Pancytopenia, acquired ([place]) 11/19/2017  . CHF (congestive heart failure), NYHA class II, acute, combined ([place]) 11/03/2017  . Hyperglycemia 11/03/2017  . Protein-calorie malnutrition, moderate ([place]) 10/28/2017  . Other constipation 10/20/2017  . Postherpetic neuralgia 10/20/2017  . Cancer associated pain 10/20/2017  . Physical debility 10/20/2017  . Goals of care, counseling/discussion 10/20/2017  . Ovarian cancer ([place]) 10/19/2017  . Intraperitoneal hemorrhage   . Abdominal pain 10/12/2017  . Pain of joint of left ankle and foot 08/03/2017  . Persistent atrial fibrillation   . Warfarin anticoagulation   . DOE (dyspnea on exertion)   . Status post coronary artery stent placement   . Normocytic anemia 07/11/2016  . History of small bowel  obstruction 12/22/2015  . Permanent atrial fibrillation with RVR 12/15/2015  . Demand ischemia ([place]) 12/15/2015  . NSTEMI (non-ST elevated myocardial infarction) ([place]) 05/18/2015  . Chronic anticoagulation - coumadin, CHADS2VASC=6 05/17/2015  . Unstable angina ([place]) 05/17/2015  . Swelling of lower extremity 04/18/2015  . Encounter for therapeutic drug monitoring 08/25/2014  . 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[place]) 06/16/2014  . Ischemic chest pain ([place])   . OSA on CPAP 05/14/2014  . Chest pain 01/05/2014  . Atrial flutter ([place])   . Hypokalemia   . Pacemaker - St Jude Leadless PPM   . Combined systolic and diastolic  heart failure, NYHA class 3 ([place])   . Tachycardia-bradycardia syndrome ([place]) 09/30/2012  . Atrial fibrillation with RVR ([place]) 12/28/2011  . Ventral hernia 10/16/2011  . Granulosa cell tumor of ovary 04/23/2011  . CAD (coronary artery disease) 10/03/2010  . Essential hypertension 10/03/2010  . Hyperlipidemia 10/03/2010  . Renal artery stenosis ([place]) 10/03/2010  . Obesity 10/03/2010    Past Surgical History:  Procedure Laterality Date  . ABDOMINAL HYSTERECTOMY    . CARDIAC CATHETERIZATION  09/03/2007   EF 70%; Failed attempt at PCI to OM  . CARDIAC CATHETERIZATION  11/01/2003   EF 70%  . CARDIAC CATHETERIZATION N/A 12/14/2015   Procedure: Left Heart Cath and Coronary Angiography;  Surgeon: Burnell Blanks, MD;  Location: [facility] CV LAB;  Service: Cardiovascular;  Laterality: N/A;  . CARDIOVERSION  12/31/2011   Procedure: CARDIOVERSION;  Surgeon: [name], MD;  Location: [facility] ENDOSCOPY;  Service: Cardiovascular;  Laterality: N/A;  . CARDIOVERSION N/A 12/31/2011   Procedure: CARDIOVERSION;  Surgeon: [name], MD;  Location: [facility] CATH LAB;  Service: Cardiovascular;  Laterality: N/A;  . CATARACT EXTRACTION, BILATERAL  2015  . CHOLECYSTECTOMY  1980's  . COLON SURGERY  2004   colectomy for diverticulosis  . CORONARY ANGIOPLASTY WITH STENT PLACEMENT  2000; 08/11/2012; 11/12/2012   3 + 2 LAD & CFX; 2nd CFX stent 11/12/2012  . CORONARY ATHERECTOMY N/A 07/15/2016   Procedure: Coronary Atherectomy;  Surgeon: Martinique, Peter M, MD;  Location: [facility] CV LAB;  Service: Cardiovascular;  Laterality: N/A;  . CORONARY BALLOON ANGIOPLASTY N/A 07/14/2016   Procedure: Coronary Balloon Angioplasty;  Surgeon: Martinique, Peter M, MD;  Location: [facility] CV LAB;  Service: Cardiovascular;  Laterality: N/A;  . CORONARY STENT INTERVENTION N/A 07/15/2016   Procedure: Coronary Stent Intervention;  Surgeon: Martinique, Peter M, MD;  Location: [facility] CV LAB;  Service: Cardiovascular;   Laterality: N/A;  . FRACTIONAL FLOW RESERVE WIRE  10/07/2013   Procedure: [place];  Surgeon: [name], MD;  Location: [facility] CATH LAB;  Service: Cardiovascular;;  . granulosa tumor excision  2000; 2003; 2004; 2007   "all in my abdomen including small intestines, outside my ?uterus/etc" (08/11/2012)  . HERNIA REPAIR  2005   "laparoscopic"  . IR IMAGING GUIDED PORT INSERTION  10/28/2017  . LEFT HEART CATH AND CORONARY ANGIOGRAPHY N/A 07/14/2016   Procedure: Left Heart Cath and Coronary Angiography;  Surgeon: Martinique, Peter M, MD;  Location: [facility] CV LAB;  Service: Cardiovascular;  Laterality: N/A;  . LEFT HEART CATHETERIZATION WITH CORONARY ANGIOGRAM N/A 11/12/2012   Procedure: LEFT HEART CATHETERIZATION WITH CORONARY ANGIOGRAM;  Surgeon: [name], MD;  Location: [facility] CATH LAB;  Service: Cardiovascular;  Laterality: N/A;  . LEFT HEART CATHETERIZATION WITH CORONARY ANGIOGRAM N/A 10/07/2013   Procedure: LEFT HEART CATHETERIZATION WITH CORONARY ANGIOGRAM;  Surgeon: [name],  MD;  Location: [facility] CATH LAB;  Service: Cardiovascular;  Laterality: N/A;  . LEFT HEART CATHETERIZATION WITH CORONARY ANGIOGRAM N/A 12/14/2013   Procedure: LEFT HEART CATHETERIZATION WITH CORONARY ANGIOGRAM;  Surgeon: Sinclair Grooms, MD;  Location: [facility] CATH LAB;  Service: Cardiovascular;  Laterality: N/A;  . LEFT HEART CATHETERIZATION WITH CORONARY ANGIOGRAM N/A 05/16/2014   Procedure: LEFT HEART CATHETERIZATION WITH CORONARY ANGIOGRAM;  Surgeon: Sherren Mocha, MD;  Location: [facility] CATH LAB;  Service: Cardiovascular;  Laterality: N/A;  . PERCUTANEOUS CORONARY INTERVENTION-BALLOON ONLY  08/04/2012   Procedure: PERCUTANEOUS CORONARY INTERVENTION-BALLOON ONLY;  Surgeon: [name], MD;  Location: [facility] CATH LAB;  Service: Cardiovascular;;  . PERCUTANEOUS CORONARY ROTOBLATOR INTERVENTION (PCI-R) N/A 08/11/2012   Procedure: PERCUTANEOUS CORONARY ROTOBLATOR INTERVENTION (PCI-R);  Surgeon:  [name], MD;  Location: [facility] CATH LAB;  Service: Cardiovascular;  Laterality: N/A;  . PERCUTANEOUS CORONARY STENT INTERVENTION (PCI-S)  10/07/2013   Procedure: PERCUTANEOUS CORONARY STENT INTERVENTION (PCI-S);  Surgeon: [name], MD;  Location: [facility] CATH LAB;  Service: Cardiovascular;;  . PERMANENT PACEMAKER INSERTION N/A 03/16/2012   [name] (SJM) leadless pacemaker (LEADLESS II STUDY PATEINT)  . SALIVARY GLAND SURGERY  2000's   "had a little lump removed; granulosa related; it was benign" (08/11/2012)  . TEE WITHOUT CARDIOVERSION  12/31/2011   Procedure: TRANSESOPHAGEAL ECHOCARDIOGRAM (TEE);  Surgeon: [name], MD;  Location: [facility];  Service: Cardiovascular;  Laterality: N/A;  . VARICOSE VEIN SURGERY Bilateral 1977     OB History   No obstetric history on file.      Home Medications    Prior to Admission medications   Medication Sig Start Date End Date Taking? Authorizing Provider  acetaminophen (TYLENOL) 500 MG tablet Take 500 mg by mouth every 6 (six) hours as needed.    [provider]  atorvastatin (LIPITOR) 80 MG tablet TAKE 1 TABLET BY MOUTH EVERY DAY IN THE MORNING 04/07/18   [name], MD  beta carotene w/minerals (OCUVITE) tablet Take 1 tablet by mouth 2 (two) times daily.     [provider]  Calcium Carb-Cholecalciferol (CALTRATE 600+D) 600-800 MG-UNIT TABS Take 1 tablet by mouth every evening.     [provider]  clopidogrel (PLAVIX) 75 MG tablet Take 1 tablet (75 mg total) by mouth daily. 02/23/18   [name], MD  Coenzyme Q-10 100 MG capsule Take 100-200 mg by mouth daily.     [provider]  diltiazem (CARDIZEM CD) 120 MG 24 hr capsule Take 1 capsule (120 mg total) by mouth at bedtime. 09/07/18   [name], MD  diltiazem [facility] CD) 240 MG 24 hr capsule TAKE ONE CAPSULE BY MOUTH DAILY WITH BREAKFAST 08/31/18   [name], MD  docusate sodium (COLACE) 100 MG  capsule Take 100 mg by mouth 2 (two) times daily.    [provider]  furosemide (LASIX) 40 MG tablet Take 1 tablet (40 mg total) by mouth 2 (two) times daily. 09/16/18   [name], MD  gabapentin (NEURONTIN) 300 MG capsule TAKE 1 CAPSULE BY MOUTH TWICE A DAY 08/02/18   Heath Lark, MD  isosorbide mononitrate (IMDUR) 60 MG 24 hr tablet Take 1 tablet (60 mg total) by mouth daily. 02/19/18   [name], MD  KLOR-CON M20 20 MEQ tablet TAKE 1 TABLET BY MOUTH EVERY DAY 07/28/18   [name], MD  lidocaine-prilocaine (EMLA) cream Apply to [facility] site 1-2 hours prior to access 09/30/18  Joylene John D, NP  megestrol (MEGACE) 40 MG tablet Take 1 tablet (40 mg total) by mouth 2 (two) times daily. Patient not taking: Reported on 09/16/2018 09/06/18   Everitt Amber, MD  metoprolol tartrate (LOPRESSOR) 100 MG tablet TAKE 1 TABLET BY MOUTH TWICE A DAY 08/31/18   [name], MD  Multiple Vitamin (MULTIVITAMIN) capsule Take 1 capsule by mouth daily.    [provider]  nitroGLYCERIN (NITROSTAT) 0.4 MG SL tablet PLACE 1 TABLET (0.4 MG TOTAL) UNDER THE TONGUE EVERY 5 (FIVE) MINUTES AS NEEDED. FOR CHEST PAIN 08/31/17   Thompson Grayer, MD  NON FORMULARY Place 1 each into the nose at bedtime. CPAP setting 3.5    [provider]  Omega-3 Fatty Acids (FISH OIL) 1200 MG CAPS Take 1,200 mg by mouth daily.     [provider]  Probiotic Product (PROBIOTIC-10 PO) Take 1 capsule by mouth daily.    [provider]  spironolactone (ALDACTONE) 25 MG tablet Take 25 mg by mouth daily as needed.    [provider]  warfarin (COUMADIN) 2 MG tablet TAKE 1 TABLET DAILY EXCEPT 1/2 TABLET ON MONDAYS AND FRIDAYS OR AS DIRECTED BY COUMADIN CLINIC. 08/02/18   [name], MD    Family History Family History  Problem Relation Age of Onset  . Stroke Mother   . Heart attack Father   . Diabetes Father   . Hypertension Father   . Heart attack  Brother   . Diabetes Brother   . Hypertension Brother   . Kidney failure Brother     Social History Social History   Tobacco Use  . Smoking status: Former Smoker    Packs/day: 1.00    Years: 32.00    Pack years: 32.00    Types: Cigarettes    Quit date: 02/03/1974    Years since quitting: 44.7  . Smokeless tobacco: Never Used  Substance Use Topics  . Alcohol use: Yes    Alcohol/week: 5.0 standard drinks    Types: 5 Glasses of wine per week    Comment: occ  . Drug use: No     Allergies   Bee venom, Other, Amiodarone, and Prednisone   Review of Systems Review of Systems  All other systems reviewed and are negative.    Physical Exam Updated Vital Signs BP (!) 145/64   Pulse 77   Temp 98 F (36.7 C) (Oral)   Resp (!) 23   Ht 1.524 m (5')   Wt 78.3 kg   SpO2 98%   BMI 33.71 kg/m   Physical Exam Vitals signs and nursing note reviewed.  Constitutional:      General: She is not in acute distress.    Appearance: She is well-developed. She is ill-appearing.  HENT:     Head: Normocephalic.  Eyes:     Pupils: Pupils are equal, round, and reactive to light.  Neck:     Musculoskeletal: Normal range of motion.  Cardiovascular:     Rate and Rhythm: Normal rate and regular rhythm.     Heart sounds: Normal heart sounds.  Pulmonary:     Breath sounds: Normal breath sounds.  Abdominal:     General: Bowel sounds are normal.     Palpations: Abdomen is soft.  Musculoskeletal: Normal range of motion.  Skin:    General: Skin is warm.     Capillary Refill: Capillary refill takes less than 2 seconds.  Neurological:     General: No focal deficit present.  Mental Status: She is alert.  Psychiatric:        Mood and Affect: Mood normal.        Behavior: Behavior normal.      ED Treatments / Results  Labs (all labs ordered are listed, but only abnormal results are displayed) Labs Reviewed  BASIC METABOLIC PANEL - Abnormal; Notable for the following components:       Result Value   Glucose, Bld 186 (*)    BUN 24 (*)    Creatinine, Ser 1.12 (*)    GFR calc non Af Amer 44 (*)    GFR calc Af Amer 52 (*)    All other components within normal limits  CBC - Abnormal; Notable for the following components:   RDW 15.9 (*)    All other components within normal limits  PROTIME-INR - Abnormal; Notable for the following components:   Prothrombin Time 40.1 (*)    INR 4.2 (*)    All other components within normal limits  BRAIN NATRIURETIC PEPTIDE - Abnormal; Notable for the following components:   B Natriuretic Peptide 707.8 (*)    All other components within normal limits  TROPONIN I (HIGH SENSITIVITY) - Abnormal; Notable for the following components:   Troponin I (High Sensitivity) 30 (*)    All other components within normal limits  TROPONIN I (HIGH SENSITIVITY)    EKG EKG Interpretation  Date/Time:  Friday October 08 2018 13:39:19 EDT Ventricular Rate:  94 PR Interval:    QRS Duration: 162 QT Interval:  453 QTC Calculation: 567 R Axis:   -98 Text Interpretation:  Atrial flutter Left bundle branch block Confirmed by Pattricia Boss [phone]) on 10/08/2018 1:48:31 PM   Radiology Dg Chest Port 1 View  Result Date: 10/08/2018 CLINICAL DATA:  Chest pain EXAM: PORTABLE CHEST 1 VIEW COMPARISON:  11/03/2017 FINDINGS: Left-sided pacing device as before. Cardiomegaly with vascular congestion. Right-sided central venous port tip over the SVC. Suspected small left effusion. Hazy airspace disease at the bases. Aortic atherosclerosis. No pneumothorax. IMPRESSION: 1. Cardiomegaly with vascular congestion. 2. Probable small left effusion. Hazy atelectasis or edema at both lung bases. Electronically Signed   By: Donavan Foil M.D.   On: 10/08/2018 14:59    Procedures Procedures (including critical care time)  Medications Ordered in ED Medications  furosemide (LASIX) injection 40 mg (has no administration in time range)     Initial Impression / Assessment  and Plan / ED Course  I have reviewed the triage vital signs and the nursing notes.  Pertinent labs & imaging results that were available during my care of the patient were reviewed by me and considered in my medical decision making (see chart for details).       85 yo female known cad, a fib flutter, on chronic anticoagulation presents with chest pain since last night- waxing and waning during night but worsened and continues today.  Patient with associated dyspnea and appears to have some component of chf with elevated bnp and vascular congestin on cxr with probable effusion. Denies any covid exposure or symptoms Patient diuresed with lasix Plan nitro Patient with therapeutic inr Cardiology consulted  Discussed with Dr. Irish Lack- plan ongoing diuresis, nitro patch. Will discuss with cardiology on call for admission for diuresis and troponin trending. Discussed with cardiology and will see and evaluate  Final Clinical Impressions(s) / ED Diagnoses   Final diagnoses:  Chest pain, unspecified type  Congestive heart failure, unspecified HF chronicity, unspecified heart failure type ([place])  ED Discharge Orders    None       Pattricia Boss, MD 10/08/18 1556

## 2018-10-08 NOTE — ED Triage Notes (Signed)
Per [facility] pt coming from home c/o chest pain onset of last night radiating into left arm and left jaw. Associated with shortness of breath and nausea. Patient took 5 nitro at home w/o relief. EMS administered 2 nitro with relief. Left bundle branch block for EMS. Patient has pacemaker and other cardiac history.

## 2018-10-09 DIAGNOSIS — I208 Other forms of angina pectoris: Secondary | ICD-10-CM

## 2018-10-09 DIAGNOSIS — R079 Chest pain, unspecified: Secondary | ICD-10-CM | POA: Diagnosis not present

## 2018-10-09 DIAGNOSIS — I5043 Acute on chronic combined systolic (congestive) and diastolic (congestive) heart failure: Secondary | ICD-10-CM | POA: Diagnosis not present

## 2018-10-09 DIAGNOSIS — D689 Coagulation defect, unspecified: Secondary | ICD-10-CM

## 2018-10-09 LAB — COMPREHENSIVE METABOLIC PANEL
ALT: 30 U/L (ref 0–44)
AST: 27 U/L (ref 15–41)
Albumin: 3 g/dL — ABNORMAL LOW (ref 3.5–5.0)
Alkaline Phosphatase: 86 U/L (ref 38–126)
Anion gap: 9 (ref 5–15)
BUN: 24 mg/dL — ABNORMAL HIGH (ref 8–23)
CO2: 29 mmol/L (ref 22–32)
Calcium: 9.2 mg/dL (ref 8.9–10.3)
Chloride: 103 mmol/L (ref 98–111)
Creatinine, Ser: 1.19 mg/dL — ABNORMAL HIGH (ref 0.44–1.00)
GFR calc Af Amer: 48 mL/min — ABNORMAL LOW (ref >60)
GFR calc non Af Amer: 41 mL/min — ABNORMAL LOW (ref >60)
Glucose, Bld: 105 mg/dL — ABNORMAL HIGH (ref 70–99)
Potassium: 4 mmol/L (ref 3.5–5.1)
Sodium: 141 mmol/L (ref 135–145)
Total Bilirubin: 1.3 mg/dL — ABNORMAL HIGH (ref 0.3–1.2)
Total Protein: 6.7 g/dL (ref 6.5–8.1)

## 2018-10-09 LAB — PROTIME-INR
INR: 4.6 (ref 0.8–1.2)
Prothrombin Time: 42.7 seconds — ABNORMAL HIGH (ref 11.4–15.2)

## 2018-10-09 LAB — SARS CORONAVIRUS 2 (TAT 6-24 HRS): SARS Coronavirus 2: NEGATIVE

## 2018-10-09 MED ORDER — WARFARIN SODIUM 2 MG PO TABS: ORAL_TABLET | ORAL | 2 refills | Status: DC

## 2018-10-09 MED ORDER — FUROSEMIDE 40 MG PO TABS: ORAL_TABLET | ORAL | 3 refills | Status: DC

## 2018-10-09 NOTE — Progress Notes (Signed)
Progress Note  Patient Name: Patty [name] Date of Encounter: 10/09/2018  Primary Cardiologist: Larae Grooms, MD   Patient Profile     85 y.o. female admitted 9/4 for chest pain/DOE  with recent interval weight gain  CAD with multiple interventions most recently 2018 >> DES x 2 LAD & DES CFX 2000, HSRA CFX & DES LAD 2014, DES CFX 2014, DES LAD for ISR 2015, DES oLAD 2016, HSRA & DES [place] 2018, AFib/flutter permanent anticoagulation with warfarin \ Sinus node dysfunction with previously implanted PM >>STJ leadless PPM implanted 2014 (early battery depletion); STJ dual chamber PPM implanted 2018 EF 9/19 40-45 <<6/18 50-55% ER >>Diuresis  In ER  improved HsTN>> 30-47   Subjective  Much Improved Retrospectively she had noted abd distension preceding this hospitalization and more processed foods  Inpatient Medications    Scheduled Meds: . atorvastatin  80 mg Oral q1800  . calcium-vitamin D  2 tablet Oral QPM  . clopidogrel  75 mg Oral Daily  . diltiazem  120 mg Oral QHS  . docusate sodium  100 mg Oral BID  . furosemide  40 mg Intravenous BID  . gabapentin  300 mg Oral BID  . isosorbide mononitrate  60 mg Oral Daily  . megestrol  40 mg Oral BID  . metoprolol tartrate  100 mg Oral BID  . multivitamin  1 tablet Oral BID  . nitroGLYCERIN  0.4 mg Transdermal Daily  . potassium chloride SA  20 mEq Oral BID  . sodium chloride flush  3 mL Intravenous Q12H  . spironolactone  25 mg Oral Daily   Continuous Infusions: . sodium chloride     PRN Meds: sodium chloride, acetaminophen, ALPRAZolam, nitroGLYCERIN, ondansetron (ZOFRAN) IV, sodium chloride flush, zolpidem   Vital Signs    Vitals:   10/08/18 1959 10/08/18 2150 10/08/18 2252 10/09/18 0555  BP: 128/72   137/62  Pulse: (!) 51 69 70 67  Resp: 18  16 18   Temp: 98.4 F (36.9 C)   98.1 F (36.7 C)  TempSrc: Oral   Oral  SpO2: 98%  99% 95%  Weight:    76.9 kg  Height: 5' (1.524 m)       Intake/Output Summary  (Last 24 hours) at 10/09/2018 0950 Last data filed at 10/09/2018 0900 Gross per 24 hour  Intake 240 ml  Output 100 ml  Net 140 ml   Last 3 Weights 10/09/2018 10/08/2018 09/16/2018  Weight (lbs) 169 lb 8 oz 172 lb 9.6 oz 177 lb  Weight (kg) 76.885 kg 78.291 kg 80.287 kg      Telemetry    Afib  - Personally Reviewed  ECG    Afib/Flutter with LBBB - Personally Reviewed  Physical Exam    GEN: No acute distress.   Neck: supple Cardiac: IRRR, no murmurs, rubs, or gallops.  Respiratory: Clear to auscultation bilaterally. GI: Soft, nontender, non-distended  MS: No edema; No deformity. Neuro:  Nonfocal  Psych: Normal affect   Labs    High Sensitivity Troponin:   Recent Labs  Lab 10/08/18 1346 10/08/18 1546 10/08/18 1958 10/08/18 2137  TROPONINIHS 30* 39* 44* 47*      Chemistry Recent Labs  Lab 10/08/18 1346 10/09/18 0654  NA 140 141  K 3.6 4.0  CL 106 103  CO2 24 29  GLUCOSE 186* 105*  BUN 24* 24*  CREATININE 1.12* 1.19*  CALCIUM 9.0 9.2  PROT  --  6.7  ALBUMIN  --  3.0*  AST  --  27  ALT  --  30  ALKPHOS  --  86  BILITOT  --  1.3*  GFRNONAA 44* 41*  GFRAA 52* 48*  ANIONGAP 10 9     Hematology Recent Labs  Lab 10/08/18 1346  WBC 8.1  RBC 4.22  HGB 13.5  HCT 41.2  MCV 97.6  MCH 32.0  MCHC 32.8  RDW 15.9*  PLT 232    BNP Recent Labs  Lab 10/08/18 1407  BNP 707.8*     DDimer No results for input(s): DDIMER in the last 168 hours.   Radiology    Dg Chest Port 1 View  Result Date: 10/08/2018 CLINICAL DATA:  Chest pain EXAM: PORTABLE CHEST 1 VIEW COMPARISON:  11/03/2017 FINDINGS: Left-sided pacing device as before. Cardiomegaly with vascular congestion. Right-sided central venous port tip over the SVC. Suspected small left effusion. Hazy airspace disease at the bases. Aortic atherosclerosis. No pneumothorax. IMPRESSION: 1. Cardiomegaly with vascular congestion. 2. Probable small left effusion. Hazy atelectasis or edema at both lung bases.  Electronically Signed   By: Donavan Foil M.D.   On: 10/08/2018 14:59       Assessment & Plan    CHF A.C diastolic CAD  Multiple prior interventions AFib permanent LBBB    Euvolemic Will discharge to home Spoke with Dr Saundra Shelling  He would not undertake imaging and with resolved symptoms would not cath Will change her furosemide to 80 qam and I have instructed her to take an extra 80 if she notes abd distension    For questions or updates, please contact [place] Please consult [email or web address] for contact info under        Signed, Virl Axe, MD  10/09/2018, 9:50 AM

## 2018-10-09 NOTE — Discharge Summary (Signed)
Discharge Summary    Patient ID: Patty [name] MRN: [id number]; DOB: 08-21-1930  Admit date: 10/08/2018 Discharge date: 10/09/2018  Primary Care Provider: [name], MD  Primary Cardiologist: Patty Grooms, MD  Primary Electrophysiologist:  Patty Grayer, MD   Discharge Diagnoses    Principal Problem:   Acute on chronic combined systolic (congestive) and diastolic (congestive)  heart failure ([name])  **Discharge weight 76.9 kg.  Active Problems:   CAD (coronary artery disease)   Essential hypertension   Demand ischemia ([name])   Stable angina ([name])   Hyperlipidemia   Pacemaker    Warfarin anticoagulation   Coagulopathy ([name])   Allergies Allergies  Allergen Reactions   Bee Venom Anaphylaxis   Other Nausea And Vomiting and Other (See Comments)    Pain medications cause severe vomiting. Tolerated slow IV morphine drip   Amiodarone Nausea Only   Prednisone Palpitations and Other (See Comments)    "Rapid Heart Beat"    Diagnostic Studies/Procedures    None _____________   History of Present Illness     85 year old female with a history of extensive coronary artery disease status post multiple interventions, chronic combined systolic and diastolic congestive heart failure, left bundle branch block, prediabetes, hypertension, hyperlipidemia, permanent atrial fibrillation on Coumadin, tachybradycardia syndrome status post dual-chamber permanent pacemaker, obesity, sleep apnea on CPAP, ovarian cancer, and stage III chronic kidney disease.  Over the past month, she has been noticing an increase in chest pressure that improves after taking her medicines in the morning.  Approximately 3 weeks ago, she needed outpatient adjustment of diuretics in the setting of 9 pound weight gain with subsequent improved output and weight reduction.  Unfortunately, over the last week, she has again experienced increasing dyspnea on exertion along with chest pressure.  As result, she called  EMS on September 4 and was taken to the [facility] ED.  Chest x-ray showed cardiomegaly with vascular congestion and she was volume overloaded on exam.  Her high-sensitivity troponin was mildly elevated at 30.  BNP was elevated at 7007.8.  She was admitted for further evaluation.  Hospital Course     Consultants: None  Following admission, Patty [name] was placed on intravenous Lasix with good response.  Her weight is down from 78.3 kg on admission to 76.9 kg this morning.  With this, she has had resolution of abdominal distention and dyspnea as well as resolution of chest discomfort.  In retrospect, she admitted to an increase in processed foods prior to admission.  She was counseled on the importance of close monitoring of sodium intake as well as weight and symptom reporting.  We will discharge her home this morning on Lasix 80 mg every morning and have advised that she may take an extra 80 mg in the afternoon for weight gain and or abdominal distention.  Her INR has been supratherapeutic since admission and was 4.6 this morning.  We have held her warfarin and have advised her to resume on Monday, September 7 at 1 mg on Mondays, Wednesdays, and Fridays and 2 mg on other days.  She is scheduled for follow-up INR in our Coumadin clinic on September 10. _____________  Discharge Vitals Blood pressure 134/90, pulse (!) 105, temperature 98.1 F (36.7 C), temperature source Oral, resp. rate 18, height 5' (1.524 m), weight 76.9 kg, SpO2 95 %.  Filed Weights   10/08/18 1340 10/09/18 0555  Weight: 78.3 kg 76.9 kg    Labs & Radiologic Studies    CBC Recent Labs  10/08/18 1346  WBC 8.1  HGB 13.5  HCT 41.2  MCV 97.6  PLT [id number]   Basic Metabolic Panel Recent Labs    10/08/18 1346 10/09/18 0654  NA 140 141  K 3.6 4.0  CL 106 103  CO2 24 29  GLUCOSE 186* 105*  BUN 24* 24*  CREATININE 1.12* 1.19*  CALCIUM 9.0 9.2   Liver Function Tests Recent Labs    10/09/18 0654  AST 27  ALT 30    ALKPHOS 86  BILITOT 1.3*  PROT 6.7  ALBUMIN 3.0*    High Sensitivity Troponin:   Recent Labs  Lab 10/08/18 1346 10/08/18 1546 10/08/18 1958 10/08/18 2137  TROPONINIHS 30* 39* 44* 47*    Thyroid Function Tests Recent Labs    10/08/18 1958  TSH 2.089   Lab Results  Component Value Date   INR 4.6 (HH) 10/09/2018   INR 4.2 (HH) 10/08/2018   INR 3.0 09/13/2018    _____________  Dg Chest Port 1 View  Result Date: 10/08/2018 CLINICAL DATA:  Chest pain EXAM: PORTABLE CHEST 1 VIEW COMPARISON:  11/03/2017 FINDINGS: Left-sided pacing device as before. Cardiomegaly with vascular congestion. Right-sided central venous port tip over the SVC. Suspected small left effusion. Hazy airspace disease at the bases. Aortic atherosclerosis. No pneumothorax. IMPRESSION: 1. Cardiomegaly with vascular congestion. 2. Probable small left effusion. Hazy atelectasis or edema at both lung bases. Electronically Signed   By: Patty [name] M.D.   On: 10/08/2018 14:59   Disposition   Pt is being discharged home today in good condition.  Follow-up Plans & Appointments    Follow-up Information    [facility] Follow up on 10/14/2018.   Specialty: Cardiology Why: 4:00 PM Contact information: [address] [place]       Patty Booze, MD Follow up in 1 week(s).   Specialties: Cardiology, Radiology, Interventional Cardiology Why: We will arrange for follow-up with either Dr. Irish Lack or 1 of his nurse practitioners or physician assistants, and contact you. Contact information: [id number] [place] [address] [phone]            Discharge Medications   Allergies as of 10/09/2018      Reactions   Bee Venom Anaphylaxis   Other Nausea And Vomiting, Other (See Comments)   Pain medications cause severe vomiting. Tolerated slow IV morphine drip   Amiodarone Nausea Only   Prednisone  Palpitations, Other (See Comments)   "Rapid Heart Beat"      Medication List    STOP taking these medications   NON FORMULARY     TAKE these medications   acetaminophen 500 MG tablet Commonly known as: TYLENOL Take 500 mg by mouth every 6 (six) hours as needed for mild pain or headache.   atorvastatin 80 MG tablet Commonly known as: LIPITOR TAKE 1 TABLET BY MOUTH EVERY DAY IN THE MORNING What changed: See the new instructions.   beta carotene w/minerals tablet Take 1 tablet by mouth 2 (two) times daily.   Caltrate 600+D 600-800 MG-UNIT Tabs Generic drug: Calcium Carb-Cholecalciferol Take 1 tablet by mouth every evening.   clopidogrel 75 MG tablet Commonly known as: PLAVIX Take 1 tablet (75 mg total) by mouth daily.   Coenzyme Q-10 100 MG capsule Take 200 mg by mouth at bedtime.   diltiazem 240 MG 24 hr capsule Commonly known as: CARDIZEM CD TAKE ONE CAPSULE BY MOUTH DAILY WITH BREAKFAST What  changed: See the new instructions.   diltiazem 120 MG 24 hr capsule Commonly known as: CARDIZEM CD Take 1 capsule (120 mg total) by mouth at bedtime. What changed: Another medication with the same name was changed. Make sure you understand how and when to take each.   docusate sodium 100 MG capsule Commonly known as: COLACE Take 100 mg by mouth daily as needed for mild constipation (IF NO B/M AFTER 2 DAYS).   Fish Oil 1200 MG Caps Take 1,200 mg by mouth daily.   furosemide 40 MG tablet Commonly known as: LASIX 2 tabs by mouth every morning. 2 additional tabs daily as needed for wt gain/abdominal distension. What changed:   how much to take  how to take this  when to take this  additional instructions   gabapentin 300 MG capsule Commonly known as: NEURONTIN TAKE 1 CAPSULE BY MOUTH TWICE A DAY   isosorbide mononitrate 60 MG 24 hr tablet Commonly known as: IMDUR Take 1 tablet (60 mg total) by mouth daily.   Klor-Con M20 20 MEQ tablet Generic drug: potassium  chloride SA TAKE 1 TABLET BY MOUTH EVERY DAY What changed: how much to take   lidocaine-prilocaine cream Commonly known as: EMLA Apply to PAC site 1-2 hours prior to access   megestrol 40 MG tablet Commonly known as: MEGACE Take 1 tablet (40 mg total) by mouth 2 (two) times daily.   metoprolol tartrate 100 MG tablet Commonly known as: LOPRESSOR TAKE 1 TABLET BY MOUTH TWICE A DAY   multivitamin capsule Take 1 capsule by mouth daily.   nitroGLYCERIN 0.4 MG SL tablet Commonly known as: NITROSTAT PLACE 1 TABLET (0.4 MG TOTAL) UNDER THE TONGUE EVERY 5 (FIVE) MINUTES AS NEEDED. FOR CHEST PAIN What changed: See the new instructions.   PROBIOTIC-10 PO Take 1 capsule by mouth daily.   spironolactone 25 MG tablet Commonly known as: ALDACTONE Take 25 mg by mouth See admin instructions. Take 25 mg by mouth once a day at 4 PM   warfarin 2 MG tablet Commonly known as: COUMADIN Take as directed. If you are unsure how to take this medication, talk to your nurse or doctor. Original instructions: Resume Monday 9/7 as follows: 1/2 tab daily on MWF. 1 tab daily on TTSS. What changed: See the new instructions.        Acute coronary syndrome (MI, NSTEMI, STEMI, etc) this admission?: No.    Outstanding Labs/Studies   INR f/u @ Coumadin clinic 9/10.  Duration of Discharge Encounter   Greater than 30 minutes including physician time.  Signed, Murray Hodgkins, NP 10/09/2018, 12:39 PM

## 2018-10-09 NOTE — Progress Notes (Signed)
Discharge instructions given to patient. I sat down and went over patient's medication with her and answered all her questions about medication. IV is taken out and telemetry box is out. Patient is waiting for her son in law to come pick her up.

## 2018-10-09 NOTE — Progress Notes (Signed)
Lab called pt. INR  is 4.6 this am, MD and pharmacy notified. No new order given will continue to monitor the patient.

## 2018-10-09 NOTE — Progress Notes (Signed)
Patient was explained all the medications she was given and what they are for. She mentioned something about her blood thinner and I was able to explain to her that her INR is elevated and that might be the reason why blood thinner is not given to her for two days as she mentioned.

## 2018-10-09 NOTE — Discharge Instructions (Signed)
***  PLEASE REMEMBER TO BRING ALL OF YOUR MEDICATIONS TO EACH OF YOUR FOLLOW-UP OFFICE VISITS.  

## 2018-10-09 NOTE — Progress Notes (Signed)
Called for INR 4.6- she was on Coumadin PTA, not now. Pt reports no unusual bleeding.  Continue to hold Coumadin and follow INR.  Kerin Ransom PA-C 10/09/2018 9:35 AM

## 2018-10-09 NOTE — Care Management Obs Status (Signed)
[facility] NOTIFICATION   Patient Details  Name: Patty [name] MRN: [id number] Date of Birth: [birth year]   Medicare Observation Status Notification Given:  Yes    Carles Collet, RN 10/09/2018, 1:19 PM

## 2018-10-09 NOTE — Care Management CC44 (Signed)
Condition Code 44 Documentation Completed  Patient Details  Name: KINBERLI RAWLS MRN: [id number] Date of Birth: 1930/05/27   Condition Code 44 given:  Yes Patient signature on Condition Code 44 notice:  Yes Documentation of 2 MD's agreement:  Yes Code 44 added to claim:  Yes    Carles Collet, RN 10/09/2018, 1:19 PM

## 2018-10-12 ENCOUNTER — Telehealth: Payer: Self-pay | Admitting: Interventional Cardiology

## 2018-10-12 NOTE — Telephone Encounter (Signed)
° °  Patient calling to request her covid results (test done at [facility]) prior to appointment on 9/10

## 2018-10-12 NOTE — Telephone Encounter (Signed)
Pt called to confirm that her COVID  test that was done in the hospital last week, is negative.  Pt states she was sent to the ER by Dr. Irish Lack for complaints she called into the office on 10/08/18.  Pt states she was admitted to the hospital and while she was there, they did a COVID test on her, and she see's this in her mychart, but does not understand the interpretation.  Endorsed to the pt that her COVID test came back NEGATIVE.  Pt verbalized understanding and was more than gracious for all the assistance provided.

## 2018-10-14 ENCOUNTER — Other Ambulatory Visit: Payer: Self-pay

## 2018-10-14 ENCOUNTER — Other Ambulatory Visit: Payer: Medicare Other | Admitting: *Deleted

## 2018-10-14 ENCOUNTER — Ambulatory Visit (INDEPENDENT_AMBULATORY_CARE_PROVIDER_SITE_OTHER): Payer: Medicare Other | Admitting: *Deleted

## 2018-10-14 DIAGNOSIS — I4891 Unspecified atrial fibrillation: Secondary | ICD-10-CM | POA: Diagnosis not present

## 2018-10-14 DIAGNOSIS — Z5181 Encounter for therapeutic drug level monitoring: Secondary | ICD-10-CM | POA: Diagnosis not present

## 2018-10-14 LAB — POCT INR: INR: 2 (ref 2.0–3.0)

## 2018-10-14 NOTE — Telephone Encounter (Signed)
Then we can order a CT of the lower spine instead then.

## 2018-10-14 NOTE — Telephone Encounter (Signed)
I called the patient and LVM informing pt that I sent the update to Dr. Jaynee Eagles and she stated we could order a CT of her lumbar spine instead of the MRI. I asked the pt for a call back to let us know if she is amenable to this. Left office number in message.

## 2018-10-14 NOTE — Telephone Encounter (Signed)
Please let patient know. During her labwork it was noted that she was hypothyroid and this could be causing her weakness. I would like her to be treated for the hypothyroidism and see if it helps. If she still continues to have weakness then we can discuss other forms of imaging but we cannot get an MRI of her lumbar spine. thanks

## 2018-10-14 NOTE — Patient Instructions (Addendum)
Description   Continue taking 1 tablet daily except 1/2 tablet on Mondays and Fridays. Recheck INR in 2 weeks. Main [phone]. Coumadin Clinic # [phone]

## 2018-10-14 NOTE — Telephone Encounter (Signed)
I spoke with Crystal at [facility] she informed me that the Radiologist said the pacemaker is not FDA approved it is in the last phase of clinical trials but the Radiologist is not comfortable with doing the MRI since it is not FDA approved. If you would like to speak to Radiologist about this their phone number is [phone].

## 2018-10-14 NOTE — Telephone Encounter (Signed)
Spoke with pt and gave her Dr. Cathren Laine message. Pt verbalized understanding. She stated she has actually had her thyroid checked twice since and it was normal. She stated Dr. Delfina Redwood did two tests and the hospital checked  TSH which was normal on 10/08/2018. Pt was recently in the hospital for heart failure. She stated she is doing better. She will be here for the EMG on 9/24. She will follow up with Dr. Delfina Redwood.

## 2018-10-15 ENCOUNTER — Other Ambulatory Visit: Payer: Self-pay | Admitting: Internal Medicine

## 2018-10-15 LAB — BASIC METABOLIC PANEL WITH GFR
BUN/Creatinine Ratio: 26 (ref 12–28)
BUN: 32 mg/dL — ABNORMAL HIGH (ref 8–27)
CO2: 27 mmol/L (ref 20–29)
Calcium: 8.9 mg/dL (ref 8.7–10.3)
Chloride: 102 mmol/L (ref 96–106)
Creatinine, Ser: 1.23 mg/dL — ABNORMAL HIGH (ref 0.57–1.00)
GFR calc Af Amer: 46 mL/min/1.73 — ABNORMAL LOW (ref >59)
GFR calc non Af Amer: 40 mL/min/1.73 — ABNORMAL LOW (ref >59)
Glucose: 98 mg/dL (ref 65–99)
Potassium: 4.1 mmol/L (ref 3.5–5.2)
Sodium: 142 mmol/L (ref 134–144)

## 2018-10-16 NOTE — Telephone Encounter (Signed)
Thanks Doctor Phillips, I would prefer a CT Myelogram where we inject dye in her spine which is the best way to look for spinal stenosis if we cannot have an MRI. If she is at ll concerned about that we can start with a plain CT and go from there but the myelogram would be best. Thanks.

## 2018-10-17 NOTE — Progress Notes (Addendum)
Cardiology Office Note   Date:  10/18/2018   ID:  Patty [name], DOB 1930-08-11, MRN [id number]  PCP:  [name], MD    No chief complaint on file.  CAD  Wt Readings from Last 3 Encounters:  10/18/18 172 lb 3.2 oz (78.1 kg)  10/09/18 169 lb 8 oz (76.9 kg)  09/16/18 177 lb (80.3 kg)       History of Present Illness: Patty [name] is a 85 y.o. female  with history of CAD s/p multiple stents, chronic combined CHF, permanent atrial fib/flutter on Coumadin with tachy-brady syndrome s/p leadless PPM (with premature battery failure no longer active - being followed conservatively for now), probable CKD III per labs, granulosa cell carcinoma, HTN, HLD, LBBB, varicose veins, renal artery stenosis, PVD, diverticulitis, OSA who presents for f/u.  She has significant CAD with stenting x 2 to the LAD and x 1 to the LCx in 2000, rotational arthrectomy to proximal LCx and DES to LAD in 2014 and DES to LCx in 2014, along with DES to LAD for -in-stent stenosis in 2015, and DES to ostial LAD in 2016. PVD is listed in chart but I cannot find further notes regarding this as LE doppler 2014 showed no evidence of significant LE disease   She was admittedin June 2018with exertional dyspnea and chest tightness and palpitations. She was found to be in rapid atrial fib/flutter and minimally elevated troponin. 2D Echo 07/11/16 showed moderate focal basal and mild concentric hypertrophy, EF 50-55%, no RWMA, septal dyssyngery c/w LBBB, mild AI, massively dilated LA, trivial TR, mild PR. Coumadin was held in prep for cath which was performed 07/14/16 showing 100% CTO of OM1 with L-L collaterals, 90% distal Cx, 100% distal PLOM, otherwise nonobstructive disease, normal LVEDP - unsuccessful PCI of the distal Cx due to inability to cross the lesion with a balloon. She was brought back 07/15/16 and underwent successful orbital atherectomy &DES to mid LCx.   She had an episode of Shingles around Memorial Day  2019. It was on the right side of her chest. She was treated with acyclovir. She still has some itching. She had constipation and nausea. She treated this with peptobismol. She initially thought it was a heart episode, but then with the rash, realized it was shingles. She still has some trouble sleeping.   She had a lot of fatigue with the shingles. SHe had a lot of stress with her son who is an alcoholic.  He had intrabdominal bleeding in 10/19 from tumors. Anticoagulation was stopped for a while and restarted when approval was given by her oncology team.   She has been treated with chemo and has responded well. No bleeding, even after Coumadin was started.    On 8/10, we communicated with the patient: "She states that her SOB has worsenedover the past week. She states that her SOB is worse first thing in the morning when she wakes up and then with activity. She denies having any chest pain, swelling, or any other Sx. She states that she has gradually gained 9 lbs over the last 8 weeks but no significant weight gain over the past week. Patient does admit to eating foods that are high in salt. Patient takes lasix 80 mg QD and k-dur 20 mEq QD. Patient also has spironolactone that she uses prn, however she has not used it in the last 2 weeks. Instructed the patient to take her prn spironolactone. Instructed patient to avoid foods that are high  in salt. Patient's last Cr- 1.00 and K-3.3 on 7/29. Patient is coming in for her INR check today. Per Dr. Irish Lack patient will need BMET check at her next INR check. Will arrange after her visit today."  She has taken 2 doses of spironolactone. No change in sx. She sleeps well, with CPAP. Sx get better as the day goes on. Breathing improves.  She takes her Lasix dose first thing in the morning.   She does have leg weakness and is seeing a neuro MD. May be related to chemotherapy.  Since the last visit, she was in the hospital.  She was  admitted with chest pain and ruled out for MI in September 2020.  SHe was treated for heart failure with IV Lasix.   She is feeling better.  She was with some family from [place], [place] for labor day weekend.  Everyday, she feels that she is getting stronger.  Since leaving the hospital, she Denies : Chest pain. Dizziness. Leg edema. Nitroglycerin use. Orthopnea. Palpitations. Paroxysmal nocturnal dyspnea. Shortness of breath. Syncope.   Chronic cough.   She notes that with taking megesterol, she has more weight gain, increased HR and volume overload.     Past Medical History:  Diagnosis Date  . Bleeding behind the abdominal cavity 10/2017  . Chronic anticoagulation - coumadin, CHADS2VASC=6 05/17/2015  . CKD (chronic kidney disease), stage III ([place])   . Combined systolic and diastolic heart failure ([place])   . Coronary artery disease    a. s/p multiple stents - stenting x 2 to the LAD and x 1 to the LCx in 2000, rotational arthrectomy to proximal LCx and DES to LAD in 2014 and DES to LCx in 2014, along with DES to LAD for re-in-stent stenosis in 2015, and DES to ostial LAD in 2016. b. 07/2016 - orbital atherectomy & DES to mid LCx.  . Diverticulitis   . Granulosa cell carcinoma ([place])    abd; last episode was in 2009  . Hyperlipidemia   . Hypertension   . LBBB (left bundle branch block)   . Myocardial infarction ([place]) 2002  . Obesity   . OSA on CPAP   . Ovarian ca ([place]) 2019  . Pacemaker failure    a. Prior leadless PPM with premature battery failure, being managed conservatively without replacement.  . Peripheral vascular disease ([place])   . Permanent atrial fibrillation 2013  . Renal artery stenosis ([place])   . Tachycardia-bradycardia syndrome ([place])    a. s/p leadless pacemaker ([name]) implanted by Dr [name]  . Varicose veins     Past Surgical History:  Procedure Laterality Date  . ABDOMINAL HYSTERECTOMY    . CARDIAC CATHETERIZATION  09/03/2007   EF 70%; Failed attempt at PCI  to OM  . CARDIAC CATHETERIZATION  11/01/2003   EF 70%  . CARDIAC CATHETERIZATION N/A 12/14/2015   Procedure: Left Heart Cath and Coronary Angiography;  Surgeon: Burnell Blanks, MD;  Location: [place] CV LAB;  Service: Cardiovascular;  Laterality: N/A;  . CARDIOVERSION  12/31/2011   Procedure: CARDIOVERSION;  Surgeon: [name], MD;  Location[facility] ENDOSCOPY;  Service: Cardiovascular;  Laterality: N/A;  . CARDIOVERSION N/A 12/31/2011   Procedure: CARDIOVERSION;  Surgeon: [name], MD;  Location: [facility] CATH LAB;  Service: Cardiovascular;  Laterality: N/A;  . CATARACT EXTRACTION, BILATERAL  2015  . CHOLECYSTECTOMY  1980's  . COLON SURGERY  2004   colectomy for diverticulosis  . CORONARY ANGIOPLASTY WITH STENT PLACEMENT  2000  and 08/11/2012; 11/12/2012: 3 + 2 LAD & CFX; 2nd CFX stent 11/12/2012  . CORONARY ATHERECTOMY N/A 07/15/2016   Procedure: Coronary Atherectomy;  Surgeon: Martinique, Peter M, MD;  Location: [place] CV LAB;  Service: Cardiovascular;  Laterality: N/A;  . CORONARY BALLOON ANGIOPLASTY N/A 07/14/2016   Procedure: Coronary Balloon Angioplasty;  Surgeon: Martinique, Peter M, MD;  Location: [place] CV LAB;  Service: Cardiovascular;  Laterality: N/A;  . CORONARY STENT INTERVENTION N/A 07/15/2016   Procedure: Coronary Stent Intervention;  Surgeon: Martinique, Peter M, MD;  Location: [place] CV LAB;  Service: Cardiovascular;  Laterality: N/A;  . FRACTIONAL FLOW RESERVE WIRE  10/07/2013   Procedure: [place];  Surgeon: [name], MD;  Location: [facility] CATH LAB;  Service: Cardiovascular;;  . HERNIA REPAIR  2005   "laparoscopic"  . IR IMAGING GUIDED PORT INSERTION  10/28/2017  . LEFT HEART CATH AND CORONARY ANGIOGRAPHY N/A 07/14/2016   Procedure: Left Heart Cath and Coronary Angiography;  Surgeon: Martinique, Peter M, MD;  Location: [place] CV LAB;  Service: Cardiovascular;  Laterality: N/A;  . LEFT HEART CATHETERIZATION WITH CORONARY  ANGIOGRAM N/A 11/12/2012   Procedure: LEFT HEART CATHETERIZATION WITH CORONARY ANGIOGRAM;  Surgeon: [name], MD;  Location: [facility] CATH LAB;  Service: Cardiovascular;  Laterality: N/A;  . LEFT HEART CATHETERIZATION WITH CORONARY ANGIOGRAM N/A 10/07/2013   Procedure: LEFT HEART CATHETERIZATION WITH CORONARY ANGIOGRAM;  Surgeon: [name], MD;  Location: [facility] CATH LAB;  Service: Cardiovascular;  Laterality: N/A;  . LEFT HEART CATHETERIZATION WITH CORONARY ANGIOGRAM N/A 12/14/2013   Procedure: LEFT HEART CATHETERIZATION WITH CORONARY ANGIOGRAM;  Surgeon: Sinclair Grooms, MD;  Location: [facility] CATH LAB;  Service: Cardiovascular;  Laterality: N/A;  . LEFT HEART CATHETERIZATION WITH CORONARY ANGIOGRAM N/A 05/16/2014   Procedure: LEFT HEART CATHETERIZATION WITH CORONARY ANGIOGRAM;  Surgeon: Sherren Mocha, MD;  Location: [facility] CATH LAB;  Service: Cardiovascular;  Laterality: N/A;  . PERCUTANEOUS CORONARY INTERVENTION-BALLOON ONLY  08/04/2012   Procedure: PERCUTANEOUS CORONARY INTERVENTION-BALLOON ONLY;  Surgeon: [name], MD;  Location: [facility] CATH LAB;  Service: Cardiovascular;;  . PERCUTANEOUS CORONARY ROTOBLATOR INTERVENTION (PCI-R) N/A 08/11/2012   Procedure: PERCUTANEOUS CORONARY ROTOBLATOR INTERVENTION (PCI-R);  Surgeon: [name], MD;  Location: [facility] CATH LAB;  Service: Cardiovascular;  Laterality: N/A;  . PERCUTANEOUS CORONARY STENT INTERVENTION (PCI-S)  10/07/2013   Procedure: PERCUTANEOUS CORONARY STENT INTERVENTION (PCI-S);  Surgeon: [name], MD;  Location: [facility] CATH LAB;  Service: Cardiovascular;;  . PERMANENT PACEMAKER INSERTION N/A 03/16/2012   [name] (SJM) leadless pacemaker (LEADLESS II STUDY PATEINT)  . SALIVARY GLAND SURGERY  2000's   "had a little lump removed; granulosa related; it was benign" (08/11/2012)  . TEE WITHOUT CARDIOVERSION  12/31/2011   Procedure: TRANSESOPHAGEAL ECHOCARDIOGRAM (TEE);  Surgeon: [name], MD;  Location: [facility] ENDOSCOPY;   Service: Cardiovascular;  Laterality: N/A;  . UMBILICAL GRANULOMA EXCISION  2000   2003; 2004; 2007: "all in my abdomen including small intestines, outside my ?uterus/etc" (08/11/2012)  . VARICOSE VEIN SURGERY Bilateral 1977     Current Outpatient Medications  Medication Sig Dispense Refill  . acetaminophen (TYLENOL) 500 MG tablet Take 500 mg by mouth every 6 (six) hours as needed for mild pain or headache.     Marland Kitchen atorvastatin (LIPITOR) 80 MG tablet TAKE 1 TABLET BY MOUTH EVERY DAY IN THE MORNING (Patient taking differently: Take 80 mg by mouth daily. ) 90 tablet 3  . beta carotene w/minerals (OCUVITE) tablet Take  1 tablet by mouth 2 (two) times daily.     . Calcium Carb-Cholecalciferol (CALTRATE 600+D) 600-800 MG-UNIT TABS Take 1 tablet by mouth every evening.     . clopidogrel (PLAVIX) 75 MG tablet Take 1 tablet (75 mg total) by mouth daily. 90 tablet 3  . Coenzyme Q-10 100 MG capsule Take 200 mg by mouth at bedtime.     Marland Kitchen diltiazem (CARDIZEM CD) 120 MG 24 hr capsule Take 1 capsule (120 mg total) by mouth at bedtime. 90 capsule 1  . diltiazem (CARDIZEM CD) 240 MG 24 hr capsule TAKE ONE CAPSULE BY MOUTH DAILY WITH BREAKFAST (Patient taking differently: Take 240 mg by mouth daily with breakfast. ) 30 capsule 11  . docusate sodium (COLACE) 100 MG capsule Take 100 mg by mouth daily as needed for mild constipation (IF NO B/M AFTER 2 DAYS).     . furosemide (LASIX) 40 MG tablet 2 tabs by mouth every morning. 2 additional tabs daily as needed for wt gain/abdominal distension. 90 tablet 3  . gabapentin (NEURONTIN) 300 MG capsule TAKE 1 CAPSULE BY MOUTH TWICE A DAY (Patient taking differently: Take 300 mg by mouth 2 (two) times daily. ) 60 capsule 8  . isosorbide mononitrate (IMDUR) 60 MG 24 hr tablet Take 1 tablet (60 mg total) by mouth daily. 90 tablet 3  . KLOR-CON M20 20 MEQ tablet TAKE 1 TABLET BY MOUTH EVERY DAY (Patient taking differently: Take 20 mEq by mouth daily. ) 90 tablet 1  .  lidocaine-prilocaine (EMLA) cream Apply to PAC site 1-2 hours prior to access 30 g 1  . metoprolol tartrate (LOPRESSOR) 100 MG tablet TAKE 1 TABLET BY MOUTH TWICE A DAY (Patient taking differently: Take 100 mg by mouth 2 (two) times daily. ) 60 tablet 9  . Multiple Vitamin (MULTIVITAMIN) capsule Take 1 capsule by mouth daily.    . nitroGLYCERIN (NITROSTAT) 0.4 MG SL tablet PLACE 1 TABLET (0.4 MG TOTAL) UNDER THE TONGUE EVERY 5 (FIVE) MINUTES AS NEEDED FOR CHEST PAIN 75 tablet 2  . Omega-3 Fatty Acids (FISH OIL) 1200 MG CAPS Take 1,200 mg by mouth daily.     . Probiotic Product (PROBIOTIC-10 PO) Take 1 capsule by mouth daily.    Marland Kitchen spironolactone (ALDACTONE) 25 MG tablet Take 25 mg by mouth See admin instructions. Take 25 mg by mouth once a day at 4 PM    . warfarin (COUMADIN) 2 MG tablet Resume Monday 9/7 as follows: 1/2 tab daily on MWF. 1 tab daily on TTSS. 30 tablet 2   No current facility-administered medications for this visit.     Allergies:   Bee venom, Other, Amiodarone, and Prednisone    Social History:  The patient  reports that she quit smoking about 44 years ago. Her smoking use included cigarettes. She has a 32.00 pack-year smoking history. She has never used smokeless tobacco. She reports current alcohol use of about 5.0 standard drinks of alcohol per week. She reports that she does not use drugs.   Family History:  The patient's family history includes Diabetes in her brother and father; Heart attack in her brother and father; Hypertension in her brother and father; Kidney failure in her brother; Stroke in her mother.    ROS:  Please see the history of present illness.   Otherwise, review of systems are positive for intentional weight loss.   All other systems are reviewed and negative.    PHYSICAL EXAM: VS:  BP (!) 118/52   Pulse 60  Ht 5' (1.524 m)   Wt 172 lb 3.2 oz (78.1 kg)   SpO2 94%   BMI 33.63 kg/m  , BMI Body mass index is 33.63 kg/m. GEN: Well nourished, well  developed, in no acute distress  HEENT: normal  Neck: no JVD, carotid bruits, or masses Cardiac: irregular; 2/6 early systolic murmur, no rubs, or gallops,no edema  Respiratory:  clear to auscultation bilaterally, normal work of breathing GI: soft, nontender, nondistended, + BS MS: no deformity or atrophy  Skin: warm and dry, no rash Neuro:  Strength and sensation are intact Psych: euthymic mood, full affect   EKG:   The ekg ordered 10/08/2018 demonstrates AFib, rate controlled   Recent Labs: 12/11/2017: Magnesium 1.7 10/08/2018: B Natriuretic Peptide 707.8; Hemoglobin 13.5; Platelets 232; TSH 2.089 10/09/2018: ALT 30 10/14/2018: BUN 32; Creatinine, Ser 1.23; Potassium 4.1; Sodium 142   Lipid Panel    Component Value Date/Time   CHOL 132 10/08/2017 0905   TRIG 68 10/08/2017 0905   HDL 66 10/08/2017 0905   CHOLHDL 2.0 10/08/2017 0905   CHOLHDL 2.8 07/11/2016 0926   VLDL 9 07/11/2016 0926   LDLCALC 52 10/08/2017 0905     Other studies Reviewed: Additional studies/ records that were reviewed today with results demonstrating: hospital records reviewed.   ASSESSMENT AND PLAN:  1. CAD: No angina.  Continue aggressive secondary prevention.  No further anginal sx since stopping megesterol. Needs lipids checked at next blood draw.  Controlled in 9/19. 2. AFib: Rate controlled.  Needs to talk to Dr. Denman George about her megesterol as it seems to correlate with her AFib RVR and volume overload.   3. Anticoagulated: No bleeding issues.  Warfarin dosing stable, although high when she was in the hospital.  4. Chronic combined heart failure: Weight decreasing.  Decreasing salt intake.  SHe had been eating more salt with quarantine.  5. She may go to [place] to stay with her daughter for a few months.    Current medicines are reviewed at length with the patient today.  The patient concerns regarding her medicines were addressed.  The following changes have been made:  No change  Labs/ tests  ordered today include:  No orders of the defined types were placed in this encounter.   Recommend 150 minutes/week of aerobic exercise Low fat, low carb, high fiber diet recommended  Disposition:   FU in 6 months   Signed, Larae Grooms, MD  10/18/2018 10:47 AM    [facility] [address], [place], [place]  [address] Phone: [phone]; Fax: [phone]

## 2018-10-18 ENCOUNTER — Ambulatory Visit (INDEPENDENT_AMBULATORY_CARE_PROVIDER_SITE_OTHER): Payer: Medicare Other | Admitting: Interventional Cardiology

## 2018-10-18 ENCOUNTER — Encounter: Payer: Self-pay | Admitting: Interventional Cardiology

## 2018-10-18 ENCOUNTER — Other Ambulatory Visit: Payer: Self-pay

## 2018-10-18 VITALS — BP 118/52 | HR 60 | Ht 60.0 in | Wt 172.2 lb

## 2018-10-18 DIAGNOSIS — Z7901 Long term (current) use of anticoagulants: Secondary | ICD-10-CM | POA: Diagnosis not present

## 2018-10-18 DIAGNOSIS — I4891 Unspecified atrial fibrillation: Secondary | ICD-10-CM | POA: Diagnosis not present

## 2018-10-18 DIAGNOSIS — I25118 Atherosclerotic heart disease of native coronary artery with other forms of angina pectoris: Secondary | ICD-10-CM

## 2018-10-18 DIAGNOSIS — I5042 Chronic combined systolic (congestive) and diastolic (congestive) heart failure: Secondary | ICD-10-CM

## 2018-10-18 NOTE — Patient Instructions (Signed)
Medication Instructions:  Your physician recommends that you continue on your current medications as directed. Please refer to the Current Medication list given to you today.  If you need a refill on your cardiac medications before your next appointment, please call your pharmacy.   Lab work: None Ordered  Testing/Procedures: None Ordered   Follow-Up: At [facility], you and your health needs are our priority.  As part of our continuing mission to provide you with exceptional heart care, we have created designated Provider Care Teams.  These Care Teams include your primary Cardiologist (physician) and Advanced Practice Providers (APPs -  Physician Assistants and Nurse Practitioners) who all work together to provide you with the care you need, when you need it. You will need a follow up appointment in 6 months.  Please call our office 2 months in advance to schedule this appointment.  You may see Larae Grooms, MD or one of the following Advanced Practice Providers on your designated Care Team:   [place], PA-C Melina Copa, PA-C . Ermalinda Barrios, PA-C

## 2018-10-18 NOTE — Telephone Encounter (Signed)
I spoke with the pt. She has agreed to have a CT myelogram as long as the MD has no concerns with her cancer situation (she is not receiving active chemo). The pt understands ins Josem Kaufmann will be completed and then she will be called to schedule it. She will be here 9/24 for EMG which starts at 9:15 AM. Her questions were answered during the call. She verbalized appreciation.

## 2018-10-20 NOTE — Addendum Note (Signed)
Addended by: Sarina Ill B on: 10/20/2018 09:16 PM   Modules accepted: Orders

## 2018-10-21 NOTE — Telephone Encounter (Signed)
Medicare/uhc auth: [employer] via [facility]c website order sent to GI. They will reach out to the patient to schedule.  

## 2018-10-22 ENCOUNTER — Inpatient Hospital Stay: Payer: Medicare Other | Attending: Hematology and Oncology

## 2018-10-22 ENCOUNTER — Telehealth: Payer: Self-pay | Admitting: *Deleted

## 2018-10-22 ENCOUNTER — Other Ambulatory Visit: Payer: Self-pay

## 2018-10-22 ENCOUNTER — Telehealth: Payer: Self-pay

## 2018-10-22 ENCOUNTER — Other Ambulatory Visit: Payer: Medicare Other

## 2018-10-22 DIAGNOSIS — Z452 Encounter for adjustment and management of vascular access device: Secondary | ICD-10-CM | POA: Insufficient documentation

## 2018-10-22 DIAGNOSIS — C569 Malignant neoplasm of unspecified ovary: Secondary | ICD-10-CM | POA: Insufficient documentation

## 2018-10-22 DIAGNOSIS — D3910 Neoplasm of uncertain behavior of unspecified ovary: Secondary | ICD-10-CM

## 2018-10-22 MED ORDER — SODIUM CHLORIDE 0.9% FLUSH
10.0000 mL | Freq: Once | INTRAVENOUS | Status: AC
  Administered 2018-10-22: 10 mL
  Filled 2018-10-22: qty 10

## 2018-10-22 MED ORDER — TAMOXIFEN CITRATE 10 MG PO TABS: 10.0000 mg | ORAL_TABLET | Freq: Every day | ORAL | 3 refills | Status: DC

## 2018-10-22 MED ORDER — HEPARIN SOD (PORK) LOCK FLUSH 100 UNIT/ML IV SOLN
250.0000 [IU] | Freq: Once | INTRAVENOUS | Status: AC
  Administered 2018-10-22: 250 [IU]
  Filled 2018-10-22: qty 5

## 2018-10-22 NOTE — Telephone Encounter (Signed)
I spoke to Patty [name], she has gained weight and is tachycardic. Her cardiologist Patty [name] suggested she stop taking the megace. per Dr. Denman George, she is to stop megace and start tamoxifen 10mg  daily.  Pt verbalized understanding.  Rx send to her pharmacy.

## 2018-10-22 NOTE — Telephone Encounter (Signed)
Requested pt call back to discuss.

## 2018-10-22 NOTE — Telephone Encounter (Signed)
FYI [employer] faxed Prior authorization request for Gabapentin.  Request to [employer] letter tray receptacle of Prior Authorization requests and forms for review.

## 2018-10-25 ENCOUNTER — Telehealth: Payer: Self-pay | Admitting: Interventional Cardiology

## 2018-10-25 ENCOUNTER — Telehealth: Payer: Self-pay | Admitting: Neurology

## 2018-10-25 NOTE — Telephone Encounter (Signed)
Follow Up  Patient is calling in to report BP of 113/69 and HR is 114 currently. Patient states that even though HR is elevated that she feels much better after taking a nitroglycerin this morning. Patient wanted to call and report HR as instructed.

## 2018-10-25 NOTE — Telephone Encounter (Signed)
Patient's HR still elevated at 114 bpm. Patient is still asymptomatic. Will forward to Dr. Irish Lack for review.

## 2018-10-25 NOTE — Telephone Encounter (Signed)
Returned call to the patient. She states that her BP was 104/69 and HR was 116 bpm  before her morning meds. She states about an hour after her meds her BP was 117/69 HR 111. She has been taking lopressor 100 mg BID and diltiazem 240 mg QD. She states that she is no longer taking the megesterol. She denies chest pain, SOB, or any other Sx. Patient states that she will call back a little later today to report HR.

## 2018-10-25 NOTE — Telephone Encounter (Signed)
New Message    Patient states her BP is fine which was 104/69, but her heart rate was high at 116.  Patient wanted to discuss with the nurse.  Please call patient back.

## 2018-10-25 NOTE — Telephone Encounter (Signed)
I spoke with Dr. Jaynee Eagles and she said it was ok for the pt to hold on the CT myelogram and to postpone the EMG/NCS by a month. Spoke with pt and she was very Patent attorney. Pt EMG/NCS r/s for Thurs 12/02/2018 @ 9:15 arrival 8:45 am.

## 2018-10-25 NOTE — Telephone Encounter (Signed)
We had so much trouble trying to get the MRI approved we have to switch to CT myelogram which delayed things. I would like to reschedule her emg/ncs until after the imaging. If we find a reason on imaging she does not have to do the emg/ncs. woul du call and ask if that is ok? She has emg/ncs Thursday

## 2018-10-25 NOTE — Telephone Encounter (Signed)
I called the pt to discuss. She stated she was getting ready to call our office to say that she has decided to hold on the CT myelogram for now because of ongoing heart issues. She stated she has been ok since her labor admission to hospital for heart, however last night and this morning she felt a little funny and her heart rate was elevated at 116. Stated she called cardiology. She feels the CT myelogram (after speaking with nurse at GI) is more than what she wants to be committed to at this time. She is concerned about overloading of information. She is amenable to proceeding with EMG but she would like to consider postponing that as well and is wanting to see what Dr. Jaynee Eagles thinks. Her last INR was 2 but she stated she was supposed to start Tamoxifen which can increase INR. I advised I will send a message to Dr. Jaynee Eagles and then call her back.

## 2018-10-26 ENCOUNTER — Other Ambulatory Visit: Payer: Self-pay

## 2018-10-26 ENCOUNTER — Encounter: Payer: Self-pay | Admitting: Podiatry

## 2018-10-26 ENCOUNTER — Ambulatory Visit (INDEPENDENT_AMBULATORY_CARE_PROVIDER_SITE_OTHER): Payer: Medicare Other | Admitting: Podiatry

## 2018-10-26 DIAGNOSIS — L84 Corns and callosities: Secondary | ICD-10-CM

## 2018-10-26 DIAGNOSIS — G620 Drug-induced polyneuropathy: Secondary | ICD-10-CM | POA: Diagnosis not present

## 2018-10-26 DIAGNOSIS — M79676 Pain in unspecified toe(s): Secondary | ICD-10-CM

## 2018-10-26 DIAGNOSIS — T451X5A Adverse effect of antineoplastic and immunosuppressive drugs, initial encounter: Secondary | ICD-10-CM

## 2018-10-26 DIAGNOSIS — B351 Tinea unguium: Secondary | ICD-10-CM | POA: Diagnosis not present

## 2018-10-26 NOTE — Telephone Encounter (Signed)
Called and spoke to patient. Her HR was 62 bpm today and she is completely asymptomatic. Patient takes diltiazem 240 mg in the AM and 120 mg in the PM and has been for quite some time. Patient will continue to monitor and let us know if her Sx change or worsen.

## 2018-10-26 NOTE — Patient Instructions (Signed)

## 2018-10-26 NOTE — Telephone Encounter (Signed)
° ° ° °  Patient calling to report HR of 62 this morning   1) What is your heart rate? 62  BP 141/60  Do you have a log of your heart rate readings (document readings)?  2) Do you have any other symptoms? no

## 2018-10-26 NOTE — Telephone Encounter (Signed)
What is the dose of diltiazem that she is taking?  I see both 120 mg and 240 mg.

## 2018-10-28 ENCOUNTER — Encounter: Payer: 59 | Admitting: Neurology

## 2018-10-29 ENCOUNTER — Other Ambulatory Visit: Payer: Self-pay

## 2018-10-29 ENCOUNTER — Ambulatory Visit (INDEPENDENT_AMBULATORY_CARE_PROVIDER_SITE_OTHER): Payer: Medicare Other | Admitting: Pharmacist

## 2018-10-29 DIAGNOSIS — Z5181 Encounter for therapeutic drug level monitoring: Secondary | ICD-10-CM | POA: Diagnosis not present

## 2018-10-29 DIAGNOSIS — I4891 Unspecified atrial fibrillation: Secondary | ICD-10-CM | POA: Diagnosis not present

## 2018-10-29 LAB — POCT INR: INR: 3.7 — AB (ref 2.0–3.0)

## 2018-10-29 NOTE — Patient Instructions (Signed)
Skip coumadin today then start taking 1 tablet daily except 1/2 tablet on Mondays, Wednesdays and Fridays. Recheck INR in 2 weeks. Main [phone]. [facility] # [phone]

## 2018-10-31 ENCOUNTER — Other Ambulatory Visit: Payer: Self-pay | Admitting: Interventional Cardiology

## 2018-10-31 NOTE — Progress Notes (Signed)
Subjective: Patty [name] to clinic with cc of painful mycotic toenails and calluses b/l hallux  which are aggravated when weightbearing with and without shoe gear.  This pain limits her daily activities. Pain symptoms resolve with periodic professional debridement.  She remains on Coumadin.   [name], MD is her PCP.   Current Outpatient Medications on File Prior to Visit  Medication Sig Dispense Refill  . acetaminophen (TYLENOL) 500 MG tablet Take 500 mg by mouth every 6 (six) hours as needed for mild pain or headache.     Marland Kitchen atorvastatin (LIPITOR) 80 MG tablet TAKE 1 TABLET BY MOUTH EVERY DAY IN THE MORNING (Patient taking differently: Take 80 mg by mouth daily. ) 90 tablet 3  . beta carotene w/minerals (OCUVITE) tablet Take 1 tablet by mouth 2 (two) times daily.     . Calcium Carb-Cholecalciferol (CALTRATE 600+D) 600-800 MG-UNIT TABS Take 1 tablet by mouth every evening.     . clopidogrel (PLAVIX) 75 MG tablet Take 1 tablet (75 mg total) by mouth daily. 90 tablet 3  . Coenzyme Q-10 100 MG capsule Take 200 mg by mouth at bedtime.     Marland Kitchen diltiazem (CARDIZEM CD) 120 MG 24 hr capsule Take 1 capsule (120 mg total) by mouth at bedtime. 90 capsule 1  . diltiazem (CARDIZEM CD) 240 MG 24 hr capsule TAKE ONE CAPSULE BY MOUTH DAILY WITH BREAKFAST (Patient taking differently: Take 240 mg by mouth daily with breakfast. ) 30 capsule 11  . docusate sodium (COLACE) 100 MG capsule Take 100 mg by mouth daily as needed for mild constipation (IF NO B/M AFTER 2 DAYS).     . furosemide (LASIX) 40 MG tablet 2 tabs by mouth every morning. 2 additional tabs daily as needed for wt gain/abdominal distension. 90 tablet 3  . gabapentin (NEURONTIN) 300 MG capsule TAKE 1 CAPSULE BY MOUTH TWICE A DAY (Patient taking differently: Take 300 mg by mouth 2 (two) times daily. ) 60 capsule 8  . isosorbide mononitrate (IMDUR) 60 MG 24 hr tablet Take 1 tablet (60 mg total) by mouth daily. 90 tablet 3  . KLOR-CON M20  20 MEQ tablet TAKE 1 TABLET BY MOUTH EVERY DAY (Patient taking differently: Take 20 mEq by mouth daily. ) 90 tablet 1  . lidocaine-prilocaine (EMLA) cream Apply to PAC site 1-2 hours prior to access 30 g 1  . metoprolol tartrate (LOPRESSOR) 100 MG tablet TAKE 1 TABLET BY MOUTH TWICE A DAY (Patient taking differently: Take 100 mg by mouth 2 (two) times daily. ) 60 tablet 9  . Multiple Vitamin (MULTIVITAMIN) capsule Take 1 capsule by mouth daily.    . nitroGLYCERIN (NITROSTAT) 0.4 MG SL tablet PLACE 1 TABLET (0.4 MG TOTAL) UNDER THE TONGUE EVERY 5 (FIVE) MINUTES AS NEEDED FOR CHEST PAIN 75 tablet 2  . Omega-3 Fatty Acids (FISH OIL) 1200 MG CAPS Take 1,200 mg by mouth daily.     . Probiotic Product (PROBIOTIC-10 PO) Take 1 capsule by mouth daily.    Marland Kitchen spironolactone (ALDACTONE) 25 MG tablet Take 25 mg by mouth See admin instructions. Take 25 mg by mouth once a day at 4 PM    . tamoxifen (NOLVADEX) 10 MG tablet Take 1 tablet (10 mg total) by mouth daily. 30 tablet 3  . warfarin (COUMADIN) 2 MG tablet Resume Monday 9/7 as follows: 1/2 tab daily on MWF. 1 tab daily on TTSS. 30 tablet 2   No current facility-administered medications on file prior to visit.  Allergies  Allergen Reactions  . Bee Venom Anaphylaxis  . Other Nausea And Vomiting and Other (See Comments)    Pain medications cause severe vomiting. Tolerated slow IV morphine drip  . Amiodarone Nausea Only  . Prednisone Palpitations and Other (See Comments)    "Rapid Heart Beat"  Objective: Physical Examination:  Vascular  Examination: Capillary refill time immediate x 10 digits.  Palpable DP/PT pulses b/l.  Digital hair absent b/l.  No edema noted b/l.  Skin temperature gradient WNL b/l.  Dermatological Examination: Skin with normal turgor, texture and tone b/l.  No open wounds b/l.  No interdigital macerations noted b/l.  Elongated, thick, discolored brittle toenails with subungual debris and pain on dorsal palpation  of nailbeds 1-5 b/l.  Hyperkeratotic lesions b/l hallux with tenderness to palpation. No edema, no erythema, no drainage, no flocculence.   Musculoskeletal Examination: Muscle strength 5/5 to all muscle groups b/l.  No pain, crepitus or joint discomfort with active/passive ROM.  Neurological Examination: Sensation intact 5/5 b/l with 10 gram monofilament.  Assessment: 1. Mycotic nail infection with pain 1-5 b/l 2. Callus b/l great toes 3. Neuropathy secondary to chemotherapy 4. Pt on long term blood thinner  Plan: 1. Toenails 1-5 b/l were debrided in length and girth without iatrogenic laceration. 2. Calluses pared b/l hallux utilizing sterile scalpel blade without incident. Continue soft, supportive shoe gear daily. Report any pedal injuries to medical professional. Follow up 9 weeks. Patient/POA to call should there be a question/concern in there interim.

## 2018-11-02 ENCOUNTER — Telehealth: Payer: Self-pay | Admitting: *Deleted

## 2018-11-02 DIAGNOSIS — M8580 Other specified disorders of bone density and structure, unspecified site: Secondary | ICD-10-CM | POA: Diagnosis not present

## 2018-11-02 DIAGNOSIS — I2510 Atherosclerotic heart disease of native coronary artery without angina pectoris: Secondary | ICD-10-CM | POA: Diagnosis not present

## 2018-11-02 DIAGNOSIS — I10 Essential (primary) hypertension: Secondary | ICD-10-CM | POA: Diagnosis not present

## 2018-11-02 DIAGNOSIS — I4819 Other persistent atrial fibrillation: Secondary | ICD-10-CM | POA: Diagnosis not present

## 2018-11-02 DIAGNOSIS — E782 Mixed hyperlipidemia: Secondary | ICD-10-CM | POA: Diagnosis not present

## 2018-11-02 DIAGNOSIS — I214 Non-ST elevation (NSTEMI) myocardial infarction: Secondary | ICD-10-CM | POA: Diagnosis not present

## 2018-11-02 DIAGNOSIS — I252 Old myocardial infarction: Secondary | ICD-10-CM | POA: Diagnosis not present

## 2018-11-02 DIAGNOSIS — D500 Iron deficiency anemia secondary to blood loss (chronic): Secondary | ICD-10-CM | POA: Diagnosis not present

## 2018-11-02 DIAGNOSIS — I5042 Chronic combined systolic (congestive) and diastolic (congestive) heart failure: Secondary | ICD-10-CM | POA: Diagnosis not present

## 2018-11-02 DIAGNOSIS — N183 Chronic kidney disease, stage 3 (moderate): Secondary | ICD-10-CM | POA: Diagnosis not present

## 2018-11-02 DIAGNOSIS — I5023 Acute on chronic systolic (congestive) heart failure: Secondary | ICD-10-CM | POA: Diagnosis not present

## 2018-11-02 DIAGNOSIS — E039 Hypothyroidism, unspecified: Secondary | ICD-10-CM | POA: Diagnosis not present

## 2018-11-02 NOTE — Telephone Encounter (Signed)
Patient called and wanted to let Dr. Denman George know that she is on the tamoxifen. She read the literature with the prescription and wanted to let Dr. Denman George know she is also on coumadin. Message forwarded to [facility] APP

## 2018-11-03 NOTE — Telephone Encounter (Addendum)
Called and left the patient a message know that per [facility] APP Dr. Denman George prescribed the tamoxifen and does know about the coumadin. But Dr. Denman George wants her to call the coumadin doctor to make sure they know and can monitor.

## 2018-11-04 ENCOUNTER — Ambulatory Visit (INDEPENDENT_AMBULATORY_CARE_PROVIDER_SITE_OTHER): Payer: Medicare Other | Admitting: *Deleted

## 2018-11-04 ENCOUNTER — Other Ambulatory Visit: Payer: Self-pay

## 2018-11-04 DIAGNOSIS — I4891 Unspecified atrial fibrillation: Secondary | ICD-10-CM

## 2018-11-04 DIAGNOSIS — Z5181 Encounter for therapeutic drug level monitoring: Secondary | ICD-10-CM | POA: Diagnosis not present

## 2018-11-04 LAB — POCT INR: INR: 2 (ref 2.0–3.0)

## 2018-11-04 NOTE — Patient Instructions (Signed)
Description   Continue taking 1 tablet daily except 1/2 tablet on Mondays, Wednesdays and Fridays. Recheck INR in 2 weeks. Main [phone]. [facility] # [phone]

## 2018-11-05 DIAGNOSIS — Z23 Encounter for immunization: Secondary | ICD-10-CM | POA: Diagnosis not present

## 2018-11-08 ENCOUNTER — Ambulatory Visit (INDEPENDENT_AMBULATORY_CARE_PROVIDER_SITE_OTHER): Payer: Medicare Other | Admitting: *Deleted

## 2018-11-08 DIAGNOSIS — M8580 Other specified disorders of bone density and structure, unspecified site: Secondary | ICD-10-CM | POA: Diagnosis not present

## 2018-11-08 DIAGNOSIS — I4821 Permanent atrial fibrillation: Secondary | ICD-10-CM

## 2018-11-08 DIAGNOSIS — I5042 Chronic combined systolic (congestive) and diastolic (congestive) heart failure: Secondary | ICD-10-CM | POA: Diagnosis not present

## 2018-11-08 DIAGNOSIS — I214 Non-ST elevation (NSTEMI) myocardial infarction: Secondary | ICD-10-CM | POA: Diagnosis not present

## 2018-11-08 DIAGNOSIS — I10 Essential (primary) hypertension: Secondary | ICD-10-CM | POA: Diagnosis not present

## 2018-11-08 DIAGNOSIS — D500 Iron deficiency anemia secondary to blood loss (chronic): Secondary | ICD-10-CM | POA: Diagnosis not present

## 2018-11-08 DIAGNOSIS — I252 Old myocardial infarction: Secondary | ICD-10-CM | POA: Diagnosis not present

## 2018-11-08 DIAGNOSIS — E782 Mixed hyperlipidemia: Secondary | ICD-10-CM | POA: Diagnosis not present

## 2018-11-08 DIAGNOSIS — I495 Sick sinus syndrome: Secondary | ICD-10-CM

## 2018-11-08 DIAGNOSIS — I4819 Other persistent atrial fibrillation: Secondary | ICD-10-CM | POA: Diagnosis not present

## 2018-11-08 DIAGNOSIS — N1831 Chronic kidney disease, stage 3a: Secondary | ICD-10-CM | POA: Diagnosis not present

## 2018-11-08 DIAGNOSIS — I5023 Acute on chronic systolic (congestive) heart failure: Secondary | ICD-10-CM | POA: Diagnosis not present

## 2018-11-08 DIAGNOSIS — I2510 Atherosclerotic heart disease of native coronary artery without angina pectoris: Secondary | ICD-10-CM | POA: Diagnosis not present

## 2018-11-09 ENCOUNTER — Emergency Department (HOSPITAL_COMMUNITY): Payer: Medicare Other

## 2018-11-09 ENCOUNTER — Inpatient Hospital Stay (HOSPITAL_COMMUNITY)
Admission: EM | Admit: 2018-11-09 | Discharge: 2018-11-16 | DRG: 280 | Disposition: A | Discharge status: Home Health | Payer: Medicare Other | Attending: Internal Medicine | Admitting: Internal Medicine

## 2018-11-09 ENCOUNTER — Other Ambulatory Visit: Payer: Self-pay

## 2018-11-09 DIAGNOSIS — Z9841 Cataract extraction status, right eye: Secondary | ICD-10-CM

## 2018-11-09 DIAGNOSIS — R079 Chest pain, unspecified: Secondary | ICD-10-CM | POA: Diagnosis not present

## 2018-11-09 DIAGNOSIS — Z79899 Other long term (current) drug therapy: Secondary | ICD-10-CM

## 2018-11-09 DIAGNOSIS — Z9842 Cataract extraction status, left eye: Secondary | ICD-10-CM

## 2018-11-09 DIAGNOSIS — I21A1 Myocardial infarction type 2: Secondary | ICD-10-CM | POA: Diagnosis not present

## 2018-11-09 DIAGNOSIS — Z833 Family history of diabetes mellitus: Secondary | ICD-10-CM

## 2018-11-09 DIAGNOSIS — I4892 Unspecified atrial flutter: Secondary | ICD-10-CM | POA: Diagnosis present

## 2018-11-09 DIAGNOSIS — Z823 Family history of stroke: Secondary | ICD-10-CM

## 2018-11-09 DIAGNOSIS — Z888 Allergy status to other drugs, medicaments and biological substances status: Secondary | ICD-10-CM

## 2018-11-09 DIAGNOSIS — Z8543 Personal history of malignant neoplasm of ovary: Secondary | ICD-10-CM

## 2018-11-09 DIAGNOSIS — Z8249 Family history of ischemic heart disease and other diseases of the circulatory system: Secondary | ICD-10-CM

## 2018-11-09 DIAGNOSIS — I495 Sick sinus syndrome: Secondary | ICD-10-CM | POA: Diagnosis present

## 2018-11-09 DIAGNOSIS — Z20828 Contact with and (suspected) exposure to other viral communicable diseases: Secondary | ICD-10-CM | POA: Diagnosis not present

## 2018-11-09 DIAGNOSIS — Z87891 Personal history of nicotine dependence: Secondary | ICD-10-CM

## 2018-11-09 DIAGNOSIS — I255 Ischemic cardiomyopathy: Secondary | ICD-10-CM | POA: Diagnosis present

## 2018-11-09 DIAGNOSIS — R110 Nausea: Secondary | ICD-10-CM | POA: Diagnosis not present

## 2018-11-09 DIAGNOSIS — I5043 Acute on chronic combined systolic (congestive) and diastolic (congestive) heart failure: Secondary | ICD-10-CM | POA: Diagnosis not present

## 2018-11-09 DIAGNOSIS — I10 Essential (primary) hypertension: Secondary | ICD-10-CM | POA: Diagnosis not present

## 2018-11-09 DIAGNOSIS — I4819 Other persistent atrial fibrillation: Secondary | ICD-10-CM

## 2018-11-09 DIAGNOSIS — Z7902 Long term (current) use of antithrombotics/antiplatelets: Secondary | ICD-10-CM

## 2018-11-09 DIAGNOSIS — N183 Chronic kidney disease, stage 3 unspecified: Secondary | ICD-10-CM | POA: Diagnosis present

## 2018-11-09 DIAGNOSIS — I471 Supraventricular tachycardia: Secondary | ICD-10-CM | POA: Diagnosis present

## 2018-11-09 DIAGNOSIS — Z7901 Long term (current) use of anticoagulants: Secondary | ICD-10-CM

## 2018-11-09 DIAGNOSIS — I272 Pulmonary hypertension, unspecified: Secondary | ICD-10-CM | POA: Diagnosis present

## 2018-11-09 DIAGNOSIS — Z950 Presence of cardiac pacemaker: Secondary | ICD-10-CM | POA: Diagnosis not present

## 2018-11-09 DIAGNOSIS — I4821 Permanent atrial fibrillation: Secondary | ICD-10-CM | POA: Diagnosis not present

## 2018-11-09 DIAGNOSIS — R0902 Hypoxemia: Secondary | ICD-10-CM | POA: Diagnosis not present

## 2018-11-09 DIAGNOSIS — R000 Tachycardia, unspecified: Secondary | ICD-10-CM | POA: Diagnosis not present

## 2018-11-09 DIAGNOSIS — G4733 Obstructive sleep apnea (adult) (pediatric): Secondary | ICD-10-CM | POA: Diagnosis present

## 2018-11-09 DIAGNOSIS — E785 Hyperlipidemia, unspecified: Secondary | ICD-10-CM | POA: Diagnosis present

## 2018-11-09 DIAGNOSIS — I447 Left bundle-branch block, unspecified: Secondary | ICD-10-CM | POA: Diagnosis present

## 2018-11-09 DIAGNOSIS — I25110 Atherosclerotic heart disease of native coronary artery with unstable angina pectoris: Secondary | ICD-10-CM | POA: Diagnosis present

## 2018-11-09 DIAGNOSIS — Z90710 Acquired absence of both cervix and uterus: Secondary | ICD-10-CM

## 2018-11-09 DIAGNOSIS — I130 Hypertensive heart and chronic kidney disease with heart failure and stage 1 through stage 4 chronic kidney disease, or unspecified chronic kidney disease: Secondary | ICD-10-CM | POA: Diagnosis present

## 2018-11-09 DIAGNOSIS — I214 Non-ST elevation (NSTEMI) myocardial infarction: Secondary | ICD-10-CM

## 2018-11-09 DIAGNOSIS — E669 Obesity, unspecified: Secondary | ICD-10-CM | POA: Diagnosis present

## 2018-11-09 DIAGNOSIS — Z6833 Body mass index (BMI) 33.0-33.9, adult: Secondary | ICD-10-CM

## 2018-11-09 DIAGNOSIS — Z91030 Bee allergy status: Secondary | ICD-10-CM

## 2018-11-09 DIAGNOSIS — I252 Old myocardial infarction: Secondary | ICD-10-CM

## 2018-11-09 DIAGNOSIS — Z955 Presence of coronary angioplasty implant and graft: Secondary | ICD-10-CM

## 2018-11-09 DIAGNOSIS — Z9049 Acquired absence of other specified parts of digestive tract: Secondary | ICD-10-CM

## 2018-11-09 LAB — CUP PACEART REMOTE DEVICE CHECK
Battery Remaining Longevity: 123 mo
Battery Remaining Percentage: 95.5 %
Battery Voltage: 2.99 V
Brady Statistic AP VP Percent: 3.8 %
Brady Statistic AP VS Percent: 38 %
Brady Statistic AS VP Percent: 55 %
Brady Statistic AS VS Percent: 2.4 %
Brady Statistic RA Percent Paced: 22 %
Brady Statistic RV Percent Paced: 56 %
Date Time Interrogation Session: 20201005101746
Implantable Lead Implant Date: 20181224
Implantable Lead Implant Date: 20181224
Implantable Lead Location: 753859
Implantable Lead Location: 753860
Implantable Pulse Generator Implant Date: 20181224
Lead Channel Impedance Value: 530 Ohm
Lead Channel Impedance Value: 790 Ohm
Lead Channel Pacing Threshold Amplitude: 0.75 V
Lead Channel Pacing Threshold Amplitude: 0.75 V
Lead Channel Pacing Threshold Pulse Width: 0.4 ms
Lead Channel Pacing Threshold Pulse Width: 0.4 ms
Lead Channel Sensing Intrinsic Amplitude: 5 mV
Lead Channel Sensing Intrinsic Amplitude: 6.4 mV
Lead Channel Setting Pacing Amplitude: 2 V
Lead Channel Setting Pacing Amplitude: 2.5 V
Lead Channel Setting Pacing Pulse Width: 0.4 ms
Lead Channel Setting Sensing Sensitivity: 2 mV
Pulse Gen Model: 2272
Pulse Gen Serial Number: 8968200

## 2018-11-09 LAB — CBC WITH DIFFERENTIAL/PLATELET
Abs Immature Granulocytes: 0.02 10*3/uL (ref 0.00–0.07)
Basophils Absolute: 0 10*3/uL (ref 0.0–0.1)
Basophils Relative: 1 %
Eosinophils Absolute: 0.2 10*3/uL (ref 0.0–0.5)
Eosinophils Relative: 3 %
HCT: 41.3 % (ref 36.0–46.0)
Hemoglobin: 13.7 g/dL (ref 12.0–15.0)
Immature Granulocytes: 0 %
Lymphocytes Relative: 17 %
Lymphs Abs: 0.9 10*3/uL (ref 0.7–4.0)
MCH: 33.4 pg (ref 26.0–34.0)
MCHC: 33.2 g/dL (ref 30.0–36.0)
MCV: 100.7 fL — ABNORMAL HIGH (ref 80.0–100.0)
Monocytes Absolute: 0.4 10*3/uL (ref 0.1–1.0)
Monocytes Relative: 7 %
Neutro Abs: 4 10*3/uL (ref 1.7–7.7)
Neutrophils Relative %: 72 %
Platelets: 186 10*3/uL (ref 150–400)
RBC: 4.1 MIL/uL (ref 3.87–5.11)
RDW: 15.9 % — ABNORMAL HIGH (ref 11.5–15.5)
WBC: 5.5 10*3/uL (ref 4.0–10.5)
nRBC: 0 % (ref 0.0–0.2)

## 2018-11-09 LAB — BASIC METABOLIC PANEL
Anion gap: 9 (ref 5–15)
BUN: 27 mg/dL — ABNORMAL HIGH (ref 8–23)
CO2: 28 mmol/L (ref 22–32)
Calcium: 8.9 mg/dL (ref 8.9–10.3)
Chloride: 104 mmol/L (ref 98–111)
Creatinine, Ser: 1.29 mg/dL — ABNORMAL HIGH (ref 0.44–1.00)
GFR calc Af Amer: 43 mL/min — ABNORMAL LOW (ref >60)
GFR calc non Af Amer: 37 mL/min — ABNORMAL LOW (ref >60)
Glucose, Bld: 98 mg/dL (ref 70–99)
Potassium: 3.6 mmol/L (ref 3.5–5.1)
Sodium: 141 mmol/L (ref 135–145)

## 2018-11-09 LAB — TROPONIN I (HIGH SENSITIVITY)
Troponin I (High Sensitivity): 50 ng/L — ABNORMAL HIGH (ref <18)
Troponin I (High Sensitivity): 262 ng/L (ref <18)
Troponin I (High Sensitivity): 906 ng/L (ref <18)
Troponin I (High Sensitivity): 1130 ng/L (ref <18)

## 2018-11-09 LAB — PROTIME-INR
INR: 2.3 — ABNORMAL HIGH (ref 0.8–1.2)
Prothrombin Time: 24.7 seconds — ABNORMAL HIGH (ref 11.4–15.2)

## 2018-11-09 LAB — MRSA PCR SCREENING: MRSA by PCR: NEGATIVE

## 2018-11-09 LAB — SARS CORONAVIRUS 2 BY RT PCR (HOSPITAL ORDER, PERFORMED IN ~~LOC~~ HOSPITAL LAB): SARS Coronavirus 2: NEGATIVE

## 2018-11-09 LAB — BRAIN NATRIURETIC PEPTIDE: B Natriuretic Peptide: 664.9 pg/mL — ABNORMAL HIGH (ref 0.0–100.0)

## 2018-11-09 MED ORDER — PROSIGHT PO TABS
1.0000 | ORAL_TABLET | Freq: Two times a day (BID) | ORAL | Status: DC
  Administered 2018-11-09 – 2018-11-16 (×14): 1 via ORAL
  Filled 2018-11-09 (×15): qty 1

## 2018-11-09 MED ORDER — SODIUM CHLORIDE 0.9 % IV SOLN: 250.0000 mL | INTRAVENOUS | Status: DC | PRN

## 2018-11-09 MED ORDER — NITROGLYCERIN 0.4 MG SL SUBL
0.4000 mg | SUBLINGUAL_TABLET | SUBLINGUAL | Status: DC | PRN
  Administered 2018-11-11 – 2018-11-14 (×12): 0.4 mg via SUBLINGUAL
  Filled 2018-11-09 (×8): qty 1

## 2018-11-09 MED ORDER — TAMOXIFEN CITRATE 10 MG PO TABS
10.0000 mg | ORAL_TABLET | Freq: Every day | ORAL | Status: DC
  Administered 2018-11-10 – 2018-11-16 (×7): 10 mg via ORAL
  Filled 2018-11-09 (×8): qty 1

## 2018-11-09 MED ORDER — FUROSEMIDE 80 MG PO TABS
80.0000 mg | ORAL_TABLET | Freq: Every day | ORAL | Status: DC
  Administered 2018-11-10: 09:00:00 80 mg via ORAL
  Filled 2018-11-09 (×2): qty 1

## 2018-11-09 MED ORDER — MULTIVITAMINS PO CAPS: 1.0000 | ORAL_CAPSULE | Freq: Every day | ORAL | Status: DC

## 2018-11-09 MED ORDER — ADULT MULTIVITAMIN W/MINERALS CH
1.0000 | ORAL_TABLET | Freq: Every day | ORAL | Status: DC
  Administered 2018-11-10 – 2018-11-16 (×7): 1 via ORAL
  Filled 2018-11-09 (×7): qty 1

## 2018-11-09 MED ORDER — CALCIUM CARBONATE 1250 (500 CA) MG PO TABS
1.0000 | ORAL_TABLET | Freq: Every evening | ORAL | Status: DC
  Administered 2018-11-10 – 2018-11-15 (×6): 500 mg via ORAL
  Filled 2018-11-09 (×6): qty 1

## 2018-11-09 MED ORDER — COENZYME Q-10 100 MG PO CAPS: 200.0000 mg | ORAL_CAPSULE | Freq: Every day | ORAL | Status: DC

## 2018-11-09 MED ORDER — METOPROLOL TARTRATE 50 MG PO TABS
100.0000 mg | ORAL_TABLET | Freq: Two times a day (BID) | ORAL | Status: DC
  Administered 2018-11-09 – 2018-11-10 (×3): 100 mg via ORAL
  Filled 2018-11-09 (×3): qty 2

## 2018-11-09 MED ORDER — ATORVASTATIN CALCIUM 80 MG PO TABS
80.0000 mg | ORAL_TABLET | Freq: Every day | ORAL | Status: DC
  Administered 2018-11-10 – 2018-11-16 (×7): 80 mg via ORAL
  Filled 2018-11-09 (×8): qty 1

## 2018-11-09 MED ORDER — SODIUM CHLORIDE 0.9% FLUSH
10.0000 mL | Freq: Two times a day (BID) | INTRAVENOUS | Status: DC
  Administered 2018-11-09 – 2018-11-13 (×6): 10 mL

## 2018-11-09 MED ORDER — SODIUM CHLORIDE 0.9% FLUSH: 10.0000 mL | INTRAVENOUS | Status: DC | PRN

## 2018-11-09 MED ORDER — SODIUM CHLORIDE 0.9 % IV SOLN
INTRAVENOUS | Status: DC
  Administered 2018-11-10: 05:00:00 via INTRAVENOUS

## 2018-11-09 MED ORDER — CLOPIDOGREL BISULFATE 75 MG PO TABS
75.0000 mg | ORAL_TABLET | Freq: Every day | ORAL | Status: DC
  Administered 2018-11-10 – 2018-11-16 (×7): 75 mg via ORAL
  Filled 2018-11-09 (×8): qty 1

## 2018-11-09 MED ORDER — CHOLECALCIFEROL 10 MCG/ML (400 UNIT/ML) PO LIQD
800.0000 [IU] | Freq: Every evening | ORAL | Status: DC
  Administered 2018-11-09 – 2018-11-15 (×7): 800 [IU] via ORAL
  Filled 2018-11-09 (×8): qty 2

## 2018-11-09 MED ORDER — POTASSIUM CHLORIDE CRYS ER 20 MEQ PO TBCR
20.0000 meq | EXTENDED_RELEASE_TABLET | Freq: Every day | ORAL | Status: DC
  Administered 2018-11-09 – 2018-11-12 (×2): 20 meq via ORAL
  Filled 2018-11-09 (×5): qty 1

## 2018-11-09 MED ORDER — SODIUM CHLORIDE 0.9% FLUSH
3.0000 mL | Freq: Two times a day (BID) | INTRAVENOUS | Status: DC
  Administered 2018-11-09: 3 mL via INTRAVENOUS

## 2018-11-09 MED ORDER — DILTIAZEM HCL ER COATED BEADS 120 MG PO CP24
120.0000 mg | ORAL_CAPSULE | Freq: Every day | ORAL | Status: DC
  Administered 2018-11-09: 120 mg via ORAL
  Filled 2018-11-09: qty 1

## 2018-11-09 MED ORDER — GABAPENTIN 300 MG PO CAPS
300.0000 mg | ORAL_CAPSULE | Freq: Two times a day (BID) | ORAL | Status: DC
  Administered 2018-11-09 – 2018-11-16 (×14): 300 mg via ORAL
  Filled 2018-11-09 (×14): qty 1

## 2018-11-09 MED ORDER — SODIUM CHLORIDE 0.9% FLUSH: 3.0000 mL | INTRAVENOUS | Status: DC | PRN

## 2018-11-09 MED ORDER — NITROGLYCERIN 0.4 MG SL SUBL: 0.4000 mg | SUBLINGUAL_TABLET | SUBLINGUAL | Status: DC | PRN

## 2018-11-09 MED ORDER — HEPARIN (PORCINE) 25000 UT/250ML-% IV SOLN: 700.0000 [IU]/h | INTRAVENOUS | Status: DC

## 2018-11-09 MED ORDER — ACETAMINOPHEN 325 MG PO TABS: 650.0000 mg | ORAL_TABLET | ORAL | Status: DC | PRN

## 2018-11-09 MED ORDER — CHLORHEXIDINE GLUCONATE CLOTH 2 % EX PADS
6.0000 | MEDICATED_PAD | Freq: Every day | CUTANEOUS | Status: DC
  Administered 2018-11-11 – 2018-11-16 (×6): 6 via TOPICAL

## 2018-11-09 MED ORDER — SPIRONOLACTONE 25 MG PO TABS
25.0000 mg | ORAL_TABLET | ORAL | Status: DC
  Administered 2018-11-09 – 2018-11-15 (×4): 25 mg via ORAL
  Filled 2018-11-09 (×5): qty 1

## 2018-11-09 MED ORDER — ISOSORBIDE MONONITRATE ER 60 MG PO TB24
60.0000 mg | ORAL_TABLET | Freq: Every day | ORAL | Status: DC
  Administered 2018-11-10 – 2018-11-16 (×7): 60 mg via ORAL
  Filled 2018-11-09 (×8): qty 1

## 2018-11-09 MED ORDER — CALCIUM CARB-CHOLECALCIFEROL 600-800 MG-UNIT PO TABS: 1.0000 | ORAL_TABLET | Freq: Every evening | ORAL | Status: DC

## 2018-11-09 MED ORDER — ASPIRIN 81 MG PO CHEW
81.0000 mg | CHEWABLE_TABLET | ORAL | Status: AC
  Administered 2018-11-10: 81 mg via ORAL
  Filled 2018-11-09: qty 1

## 2018-11-09 MED ORDER — OCUVITE PO TABS: 1.0000 | ORAL_TABLET | Freq: Two times a day (BID) | ORAL | Status: DC

## 2018-11-09 MED ORDER — ONDANSETRON HCL 4 MG/2ML IJ SOLN: 4.0000 mg | Freq: Four times a day (QID) | INTRAMUSCULAR | Status: DC | PRN

## 2018-11-09 NOTE — ED Notes (Signed)
I order pt dinner tray

## 2018-11-09 NOTE — ED Notes (Signed)
X-ray at bedside

## 2018-11-09 NOTE — ED Triage Notes (Signed)
Pt arrived via [facility] with generalized CP that started at 0700 with sharpness and radiation to her left neck.  Pt reported one episode of nausea, no vomiting.  Pain completely relieved after 3rd dose of SL NTG.  Pt is A&Ox4 and ambulatory at home.

## 2018-11-09 NOTE — ED Provider Notes (Signed)
[facility] EMERGENCY DEPARTMENT Provider Note   CSN: [id number] Arrival date & time: 11/09/18  1010     History   Chief Complaint Chief Complaint  Patient presents with  . Chest Pain    HPI Patty [name] is a 85 y.o. female.     HPI   She presents for evaluation of chest discomfort.  She has a history of coronary disease with multiple stents, chronic heart failure, cardiac pacemaker, atrial fibrillation on Coumadin, tachybradycardia syndrome, granulosis cell carcinoma and hypertension.  Last cardiac catheterization and intervention was in June 2018.  At that time she had a coronary atherectomy and stenting.  She was hospitalized about a month ago.  At that time she was evaluated for chest pain, and had normal protocol done.  She followed up with her cardiologist, 10/18/2018.  She has previously had angina related to use of megestrol but is currently not taking it.  She is on Coumadin, for history of atrial fibrillation, and is taking tamoxifen.  She is being monitored closely for INR aberrations.  Patient states that she called EMS today after taking 3 nitroglycerin without resolution of chest pain.  EMS gave her aspirin and a fourth nitroglycerin which resolved her pain.  She denies change in chronic cough or shortness of breath with exertion.  She denies nausea, vomiting, weakness or dizziness.  She is taking her usual medications.  She took a single nitroglycerin, 1 week ago which resolved her chest pain at that time.  No recent illness.  There are no other known modifying factors.  Past Medical History:  Diagnosis Date  . Bleeding behind the abdominal cavity 10/2017  . Chronic anticoagulation - coumadin, CHADS2VASC=6 05/17/2015  . CKD (chronic kidney disease), stage III ([place])   . Combined systolic and diastolic heart failure ([place])   . Coronary artery disease    a. s/p multiple stents - stenting x 2 to the LAD and x 1 to the LCx in 2000, rotational arthrectomy  to proximal LCx and DES to LAD in 2014 and DES to LCx in 2014, along with DES to LAD for re-in-stent stenosis in 2015, and DES to ostial LAD in 2016. b. 07/2016 - orbital atherectomy & DES to mid LCx.  . Diverticulitis   . Granulosa cell carcinoma ([place])    abd; last episode was in 2009  . Hyperlipidemia   . Hypertension   . LBBB (left bundle branch block)   . Myocardial infarction ([place]) 2002  . Obesity   . OSA on CPAP   . Ovarian ca ([place]) 2019  . Pacemaker failure    a. Prior leadless PPM with premature battery failure, being managed conservatively without replacement.  . Peripheral vascular disease ([place])   . Permanent atrial fibrillation 2013  . Renal artery stenosis ([place])   . Tachycardia-bradycardia syndrome ([place])    a. s/p leadless pacemaker ([name]) implanted by Dr [name]  . Varicose veins     Patient Active Problem List   Diagnosis Date Noted  . Stable angina ([place]) 10/09/2018  . Coagulopathy ([place]) 10/09/2018  . Acute on chronic combined systolic and diastolic CHF (congestive heart failure) ([place]) 10/08/2018  . Peripheral neuropathy due to chemotherapy ([place]) 11/19/2017  . Pancytopenia, acquired ([place]) 11/19/2017  . Acute on chronic combined systolic (congestive) and diastolic (congestive) heart failure ([place]) 11/03/2017  . Hyperglycemia 11/03/2017  . Protein-calorie malnutrition, moderate ([place]) 10/28/2017  . Other constipation 10/20/2017  . Postherpetic neuralgia 10/20/2017  . Cancer associated pain 10/20/2017  .  Physical debility 10/20/2017  . Goals of care, counseling/discussion 10/20/2017  . Ovarian cancer ([place]) 10/19/2017  . Intraperitoneal hemorrhage   . Abdominal pain 10/12/2017  . Pain of joint of left ankle and foot 08/03/2017  . Persistent atrial fibrillation   . Warfarin anticoagulation   . DOE (dyspnea on exertion)   . Status post coronary artery stent placement   . Normocytic anemia 07/11/2016  . History of small bowel obstruction 12/22/2015  . Permanent  atrial fibrillation with RVR 12/15/2015  . Demand ischemia ([place]) 12/15/2015  . NSTEMI (non-ST elevated myocardial infarction) ([place]) 05/18/2015  . Chronic anticoagulation - coumadin, CHADS2VASC=6 05/17/2015  . Unstable angina ([place]) 05/17/2015  . Swelling of lower extremity 04/18/2015  . Encounter for therapeutic drug monitoring 08/25/2014  . 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[place]) 06/16/2014  . Ischemic chest pain ([place])   . OSA on CPAP 05/14/2014  . Chest pain 01/05/2014  . Atrial flutter ([place])   . Hypokalemia   . Pacemaker - St Jude Leadless PPM   . Combined systolic and diastolic heart failure, NYHA class 3 ([place])   . Tachycardia-bradycardia syndrome ([place]) 09/30/2012  . Atrial fibrillation with RVR ([name]) 12/28/2011  . Ventral hernia 10/16/2011  . Granulosa cell tumor of ovary 04/23/2011  . CAD (coronary artery disease) 10/03/2010  . Essential hypertension 10/03/2010  . Hyperlipidemia 10/03/2010  . Renal artery stenosis ([place]) 10/03/2010  . Obesity 10/03/2010    Past Surgical History:  Procedure Laterality Date  . ABDOMINAL HYSTERECTOMY    . CARDIAC CATHETERIZATION  09/03/2007   EF 70%; Failed attempt at PCI to OM  . CARDIAC CATHETERIZATION  11/01/2003   EF 70%  . CARDIAC CATHETERIZATION N/A 12/14/2015   Procedure: Left Heart Cath and Coronary Angiography;  Surgeon: Burnell Blanks, MD;  Location: [place] CV LAB;  Service: Cardiovascular;  Laterality: N/A;  . CARDIOVERSION  12/31/2011   Procedure: CARDIOVERSION;  Surgeon: [name], MD;  Location: [facility] ENDOSCOPY;  Service: Cardiovascular;  Laterality: N/A;  . CARDIOVERSION N/A 12/31/2011   Procedure: CARDIOVERSION;  Surgeon: [name], MD;  Location: [facility] CATH LAB;  Service: Cardiovascular;  Laterality: N/A;  . CATARACT EXTRACTION, BILATERAL  2015  . CHOLECYSTECTOMY  1980's  . COLON SURGERY  2004   colectomy for diverticulosis  . CORONARY ANGIOPLASTY WITH STENT PLACEMENT  2000    and 08/11/2012;  11/12/2012: 3 + 2 LAD & CFX; 2nd CFX stent 11/12/2012  . CORONARY ATHERECTOMY N/A 07/15/2016   Procedure: Coronary Atherectomy;  Surgeon: Martinique, Peter M, MD;  Location: [place] CV LAB;  Service: Cardiovascular;  Laterality: N/A;  . CORONARY BALLOON ANGIOPLASTY N/A 07/14/2016   Procedure: Coronary Balloon Angioplasty;  Surgeon: Martinique, Peter M, MD;  Location: [place] CV LAB;  Service: Cardiovascular;  Laterality: N/A;  . CORONARY STENT INTERVENTION N/A 07/15/2016   Procedure: Coronary Stent Intervention;  Surgeon: Martinique, Peter M, MD;  Location: [place] CV LAB;  Service: Cardiovascular;  Laterality: N/A;  . FRACTIONAL FLOW RESERVE WIRE  10/07/2013   Procedure: [place];  Surgeon: [name], MD;  Location: [facility] CATH LAB;  Service: Cardiovascular;;  . HERNIA REPAIR  2005   "laparoscopic"  . IR IMAGING GUIDED PORT INSERTION  10/28/2017  . LEFT HEART CATH AND CORONARY ANGIOGRAPHY N/A 07/14/2016   Procedure: Left Heart Cath and Coronary Angiography;  Surgeon: Martinique, Peter M, MD;  Location: [place] CV LAB;  Service: Cardiovascular;  Laterality: N/A;  . LEFT HEART CATHETERIZATION WITH  CORONARY ANGIOGRAM N/A 11/12/2012   Procedure: LEFT HEART CATHETERIZATION WITH CORONARY ANGIOGRAM;  Surgeon: [name], MD;  Location: [facility] CATH LAB;  Service: Cardiovascular;  Laterality: N/A;  . LEFT HEART CATHETERIZATION WITH CORONARY ANGIOGRAM N/A 10/07/2013   Procedure: LEFT HEART CATHETERIZATION WITH CORONARY ANGIOGRAM;  Surgeon: [name], MD;  Location: [facility] CATH LAB;  Service: Cardiovascular;  Laterality: N/A;  . LEFT HEART CATHETERIZATION WITH CORONARY ANGIOGRAM N/A 12/14/2013   Procedure: LEFT HEART CATHETERIZATION WITH CORONARY ANGIOGRAM;  Surgeon: Sinclair Grooms, MD;  Location: [facility] CATH LAB;  Service: Cardiovascular;  Laterality: N/A;  . LEFT HEART CATHETERIZATION WITH CORONARY ANGIOGRAM N/A 05/16/2014   Procedure: LEFT HEART CATHETERIZATION WITH CORONARY  ANGIOGRAM;  Surgeon: Sherren Mocha, MD;  Location: [facility] CATH LAB;  Service: Cardiovascular;  Laterality: N/A;  . PERCUTANEOUS CORONARY INTERVENTION-BALLOON ONLY  08/04/2012   Procedure: PERCUTANEOUS CORONARY INTERVENTION-BALLOON ONLY;  Surgeon: [name], MD;  Location: [facility] CATH LAB;  Service: Cardiovascular;;  . PERCUTANEOUS CORONARY ROTOBLATOR INTERVENTION (PCI-R) N/A 08/11/2012   Procedure: PERCUTANEOUS CORONARY ROTOBLATOR INTERVENTION (PCI-R);  Surgeon: [name], MD;  Location: [facility] CATH LAB;  Service: Cardiovascular;  Laterality: N/A;  . PERCUTANEOUS CORONARY STENT INTERVENTION (PCI-S)  10/07/2013   Procedure: PERCUTANEOUS CORONARY STENT INTERVENTION (PCI-S);  Surgeon: [name], MD;  Location: [facility] CATH LAB;  Service: Cardiovascular;;  . PERMANENT PACEMAKER INSERTION N/A 03/16/2012   [name] (SJM) leadless pacemaker (LEADLESS II STUDY PATEINT)  . SALIVARY GLAND SURGERY  2000's   "had a little lump removed; granulosa related; it was benign" (08/11/2012)  . TEE WITHOUT CARDIOVERSION  12/31/2011   Procedure: TRANSESOPHAGEAL ECHOCARDIOGRAM (TEE);  Surgeon: [name], MD;  Location: [facility] ENDOSCOPY;  Service: Cardiovascular;  Laterality: N/A;  . UMBILICAL GRANULOMA EXCISION  2000   2003; 2004; 2007: "all in my abdomen including small intestines, outside my ?uterus/etc" (08/11/2012)  . VARICOSE VEIN SURGERY Bilateral 1977     OB History   No obstetric history on file.      Home Medications    Prior to Admission medications   Medication Sig Start Date End Date Taking? Authorizing Provider  acetaminophen (TYLENOL) 500 MG tablet Take 500 mg by mouth every 6 (six) hours as needed for mild pain or headache.    Yes [provider]  atorvastatin (LIPITOR) 80 MG tablet TAKE 1 TABLET BY MOUTH EVERY DAY IN THE MORNING Patient taking differently: Take 80 mg by mouth daily.  04/07/18  Yes [name], MD  beta carotene w/minerals (OCUVITE) tablet Take 1  tablet by mouth 2 (two) times daily.    Yes [provider]  Calcium Carb-Cholecalciferol (CALTRATE 600+D) 600-800 MG-UNIT TABS Take 1 tablet by mouth every evening.    Yes [provider]  clopidogrel (PLAVIX) 75 MG tablet Take 1 tablet (75 mg total) by mouth daily. 02/23/18  Yes [name], MD  Coenzyme Q-10 100 MG capsule Take 200 mg by mouth at bedtime.    Yes [provider]  diltiazem (CARDIZEM CD) 120 MG 24 hr capsule Take 1 capsule (120 mg total) by mouth at bedtime. 09/07/18  Yes [name], MD  diltiazem (CARDIZEM CD) 240 MG 24 hr capsule TAKE ONE CAPSULE BY MOUTH DAILY WITH BREAKFAST Patient taking differently: Take 240 mg by mouth daily with breakfast.  08/31/18  Yes [name], MD  docusate sodium (COLACE) 100 MG capsule Take 100 mg by mouth daily as needed for mild constipation (IF NO B/M AFTER  2 DAYS).    Yes [provider]  furosemide (LASIX) 40 MG tablet 2 tabs by mouth every morning. 2 additional tabs daily as needed for wt gain/abdominal distension. Patient taking differently: Take 80 mg by mouth daily.  10/09/18  Yes Theora Gianotti, NP  gabapentin (NEURONTIN) 300 MG capsule TAKE 1 CAPSULE BY MOUTH TWICE A DAY Patient taking differently: Take 300 mg by mouth 2 (two) times daily.  08/02/18  Yes Gorsuch, Ni, MD  isosorbide mononitrate (IMDUR) 60 MG 24 hr tablet Take 1 tablet (60 mg total) by mouth daily. 02/19/18  Yes [name], MD  KLOR-CON M20 20 MEQ tablet TAKE 1 TABLET BY MOUTH EVERY DAY Patient taking differently: Take 20 mEq by mouth daily with supper.  07/28/18  Yes [name], MD  lidocaine-prilocaine (EMLA) cream Apply to [facility] site 1-2 hours prior to access 09/30/18  Yes Cross, Melissa D, NP  metoprolol tartrate (LOPRESSOR) 100 MG tablet TAKE 1 TABLET BY MOUTH TWICE A DAY Patient taking differently: Take 100 mg by mouth 2 (two) times daily.  08/31/18  Yes [name], MD   Multiple Vitamin (MULTIVITAMIN) capsule Take 1 capsule by mouth daily.   Yes [provider]  nitroGLYCERIN (NITROSTAT) 0.4 MG SL tablet PLACE 1 TABLET (0.4 MG TOTAL) UNDER THE TONGUE EVERY 5 (FIVE) MINUTES AS NEEDED FOR CHEST PAIN Patient taking differently: Place 0.4 mg under the tongue every 5 (five) minutes as needed for chest pain.  10/18/18  Yes Allred, Jeneen Rinks, MD  Omega-3 Fatty Acids (FISH OIL) 1200 MG CAPS Take 1,200 mg by mouth daily.    Yes [provider]  spironolactone (ALDACTONE) 25 MG tablet Take 25 mg by mouth See admin instructions. Take 25 mg by mouth every other day at 4 PM   Yes [provider]  tamoxifen (NOLVADEX) 10 MG tablet Take 1 tablet (10 mg total) by mouth daily. 10/22/18  Yes Cross, Melissa D, NP  warfarin (COUMADIN) 2 MG tablet TAKE AS DIRECTED BY COUMADIN CLINIC. Patient taking differently: Take 1-2 mg by mouth See admin instructions. 1mg  on MON WED FRI and 2mg  on all other days 11/01/18  Yes [name], MD    Family History Family History  Problem Relation Age of Onset  . Stroke Mother   . Heart attack Father   . Diabetes Father   . Hypertension Father   . Heart attack Brother   . Diabetes Brother   . Hypertension Brother   . Kidney failure Brother     Social History Social History   Tobacco Use  . Smoking status: Former Smoker    Packs/day: 1.00    Years: 32.00    Pack years: 32.00    Types: Cigarettes    Quit date: 02/03/1974    Years since quitting: 44.7  . Smokeless tobacco: Never Used  Substance Use Topics  . Alcohol use: Yes    Alcohol/week: 5.0 standard drinks    Types: 5 Glasses of wine per week    Comment: occ  . Drug use: No     Allergies   Bee venom, Other, Amiodarone, and Prednisone   Review of Systems Review of Systems  All other systems reviewed and are negative.    Physical Exam Updated Vital Signs BP 112/84   Pulse (!) 113   Temp 98.2 F (36.8 C) (Oral)   Resp (!) 41   SpO2  95%   Physical Exam Vitals signs and nursing note reviewed.  Constitutional:  Appearance: She is well-developed.  HENT:     Head: Normocephalic and atraumatic.     Right Ear: External ear normal.     Left Ear: External ear normal.  Eyes:     Conjunctiva/sclera: Conjunctivae normal.     Pupils: Pupils are equal, round, and reactive to light.  Neck:     Musculoskeletal: Normal range of motion and neck supple.     Trachea: Phonation normal.  Cardiovascular:     Rate and Rhythm: Regular rhythm. Tachycardia present.     Heart sounds: Normal heart sounds. No murmur.     Comments: Mild hypertension and tachycardia. Pulmonary:     Effort: Pulmonary effort is normal.     Breath sounds: Normal breath sounds.  Abdominal:     Palpations: Abdomen is soft.     Tenderness: There is no abdominal tenderness.  Musculoskeletal: Normal range of motion.     Right lower leg: No edema.     Left lower leg: No edema.  Skin:    General: Skin is warm and dry.  Neurological:     Mental Status: She is alert and oriented to person, place, and time.     Cranial Nerves: No cranial nerve deficit.     Sensory: No sensory deficit.     Motor: No abnormal muscle tone.     Coordination: Coordination normal.  Psychiatric:        Mood and Affect: Mood normal.        Behavior: Behavior normal.        Thought Content: Thought content normal.        Judgment: Judgment normal.      ED Treatments / Results  Labs (all labs ordered are listed, but only abnormal results are displayed) Labs Reviewed  BASIC METABOLIC PANEL - Abnormal; Notable for the following components:      Result Value   BUN 27 (*)    Creatinine, Ser 1.29 (*)    GFR calc non Af Amer 37 (*)    GFR calc Af Amer 43 (*)    All other components within normal limits  CBC WITH DIFFERENTIAL/PLATELET - Abnormal; Notable for the following components:   MCV 100.7 (*)    RDW 15.9 (*)    All other components within normal limits  PROTIME-INR -  Abnormal; Notable for the following components:   Prothrombin Time 24.7 (*)    INR 2.3 (*)    All other components within normal limits  BRAIN NATRIURETIC PEPTIDE - Abnormal; Notable for the following components:   B Natriuretic Peptide 664.9 (*)    All other components within normal limits  TROPONIN I (HIGH SENSITIVITY) - Abnormal; Notable for the following components:   Troponin I (High Sensitivity) 50 (*)    All other components within normal limits  TROPONIN I (HIGH SENSITIVITY) - Abnormal; Notable for the following components:   Troponin I (High Sensitivity) 262 (*)    All other components within normal limits  SARS CORONAVIRUS 2 (HOSPITAL ORDER, [place] LAB)    EKG EKG Interpretation  Date/Time:  Tuesday November 09 2018 10:13:11 EDT Ventricular Rate:  112 PR Interval:    QRS Duration: 172 QT Interval:  455 QTC Calculation: 622 R Axis:   -137 Text Interpretation:  Ventricular-paced rhythm Confirmed by Daleen Bo [phone]) on 11/09/2018 10:21:57 AM  CHA2DS2/VAS Stroke Risk Points  Current as of 2 hours ago     6 >= 2 Points: High Risk  1 - 1.99  Points: Medium Risk  0 Points: Low Risk    This is the only CHA2DS2/VAS Stroke Risk Points available for the past  year.: Last Change: N/A     Details    This score determines the patient's risk of having a stroke if the  patient has atrial fibrillation.       Points Metrics  1 Has Congestive Heart Failure:  Yes    Current as of 2 hours ago  1 Has Vascular Disease:  Yes    Current as of 2 hours ago  1 Has Hypertension:  Yes    Current as of 2 hours ago  2 Age:  25    Current as of 2 hours ago  0 Has Diabetes:  No    Current as of 2 hours ago  0 Had Stroke:  No  Had TIA:  No  Had thromboembolism:  No    Current as of 2 hours ago  1 Female:  Yes    Current as of 2 hours ago           Radiology Dg Chest Port 1 View  Result Date: 11/09/2018 CLINICAL DATA:  Chest pain EXAM: PORTABLE CHEST  1 VIEW COMPARISON:  10/08/2018 FINDINGS: Gross cardiomegaly with left chest multi lead pacer. Aortic atherosclerosis. Right chest port catheter. No acute abnormality of the lungs. Osseous structures are unremarkable. IMPRESSION: Gross cardiomegaly without acute abnormality of the lungs in AP portable projection. Electronically Signed   By: Eddie Candle M.D.   On: 11/09/2018 11:02    Procedures .Critical Care Performed by: Daleen Bo, MD Authorized by: Daleen Bo, MD   Critical care provider statement:    Critical care time (minutes):  35   Critical care start time:  11/09/2018 10:15 AM   Critical care end time:  11/09/2018 2:42 PM   Critical care time was exclusive of:  Separately billable procedures and treating other patients   Critical care was necessary to treat or prevent imminent or life-threatening deterioration of the following conditions:  Cardiac failure   Critical care was time spent personally by me on the following activities:  Blood draw for specimens, development of treatment plan with patient or surrogate, discussions with consultants, evaluation of patient's response to treatment, examination of patient, obtaining history from patient or surrogate, ordering and performing treatments and interventions, ordering and review of laboratory studies, pulse oximetry, re-evaluation of patient's condition, review of old charts and ordering and review of radiographic studies   (including critical care time)  Medications Ordered in ED Medications - No data to display   Initial Impression / Assessment and Plan / ED Course  I have reviewed the triage vital signs and the nursing notes.  Pertinent labs & imaging results that were available during my care of the patient were reviewed by me and considered in my medical decision making (see chart for details).  Clinical Course as of Nov 08 1440  Tue Nov 09, 2018  1354 Significantly elevated delta troponin, concerning for acute  myocardial infarction.  Troponin I (High Sensitivity)(!!) [EW]  1354 Elevated  Brain natriuretic peptide(!) [EW]  1354 Normal except MCV high  CBC with Differential(!) [EW]  1354 Normal except BUN high, creatinine high, GFR low  Basic metabolic panel(!) [EW]  [id number] Elevated, therapeutic  Protime-INR(!) [EW]  1356 No infiltrate or CHF, image reviewed by me  DG Chest Port 1 View [EW]  [id number] Patient's respiratory rate was greater than 40, I went to the room and talk  to room was now 24.  He states that she was to the bathroom during this period when her respiratory rate was up.  She currently has no chest pain.  Cardiology provider is now in the room seeing the patient.   [EW]    Clinical Course User Index [EW] Daleen Bo, MD        Patient Vitals for the past 24 hrs:  BP Temp Temp src Pulse Resp SpO2  11/09/18 1345 112/84 - - (!) 113 (!) 41 95 %  11/09/18 1330 114/80 - - (!) 111 (!) 31 97 %  11/09/18 1315 124/79 - - (!) 111 (!) 22 96 %  11/09/18 1300 109/75 - - (!) 107 11 97 %  11/09/18 1245 (!) 153/132 - - (!) 113 (!) 23 99 %  11/09/18 1230 (!) 132/92 - - (!) 118 (!) 23 97 %  11/09/18 1215 125/85 - - (!) 113 (!) 25 98 %  11/09/18 1200 108/78 - - (!) 116 19 94 %  11/09/18 1145 112/75 - - (!) 116 19 93 %  11/09/18 1130 113/73 - - (!) 116 20 94 %  11/09/18 1115 130/83 - - (!) 114 18 95 %  11/09/18 1100 127/81 - - (!) 114 (!) 31 97 %  11/09/18 1045 139/81 - - (!) 111 (!) 27 95 %  11/09/18 1030 136/82 - - (!) 114 (!) 22 98 %  11/09/18 1017 - - - - - 95 %  11/09/18 1015 (!) 156/87 98.2 F (36.8 C) Oral (!) 111 (!) 25 97 %    1:56 PM Reevaluation with update and discussion. After initial assessment and treatment, an updated evaluation reveals she is comfortable and has no pain in her chest at this time.  She is hungry and thirsty.  Findings discussed and questions answered. [place] cardiology, they will see her in the emergency department.  2:25 PM  Medical  Decision Making: Evaluation consistent with an STEMI.  She is currently anticoagulated with warfarin, with INR 2.3.  She is already had aspirin.  She is hemodynamically stable.  She has recent hospitalization for rule out MI without intervention.  She requires evaluation by cardiology, pulse monitoring and likely intervention for etiology of pain versus medication treatment, at the discretion of cardiology services.  CRITICAL CARE-yes Performed by: Daleen Bo  Nursing Notes Reviewed/ Care Coordinated Applicable Imaging Reviewed Interpretation of Laboratory Data incorporated into ED treatment  Plan: Admit  Final Clinical Impressions(s) / ED Diagnoses   Final diagnoses:  NSTEMI (non-ST elevated myocardial infarction) [facility])    ED Discharge Orders    None       Daleen Bo, MD 11/09/18 1442

## 2018-11-09 NOTE — Progress Notes (Signed)
PHARMACIST - PHYSICIAN ORDER COMMUNICATION  CONCERNING: P&T Medication Policy on Herbal Medications  DESCRIPTION:  This patient's order for:  Coenzyme Q 10  has been noted.  This product(s) is classified as an "herbal" or natural product. Due to a lack of definitive safety studies or FDA approval, nonstandard manufacturing practices, plus the potential risk of unknown drug-drug interactions while on inpatient medications, the Pharmacy and Therapeutics Committee does not permit the use of "herbal" or natural products of this type within [place].   ACTION TAKEN: The pharmacy department is unable to verify this order at this time and your patient has been informed of this safety policy. Please reevaluate patient's clinical condition at discharge and address if the herbal or natural product(s) should be resumed at that time.  Diane Kordel Leavy, PharmD, BCPS, FCCP Clinical Pharmacist 

## 2018-11-09 NOTE — ED Notes (Signed)
Pt ambulated to RR no complaints of pain  Cp . Pt stated she was a I little SOB after walking

## 2018-11-09 NOTE — Progress Notes (Addendum)
ANTICOAGULATION CONSULT NOTE - Initial Consult  Pharmacy Consult for Heparin Indication: chest pain/ACS  Allergies  Allergen Reactions  . Bee Venom Anaphylaxis  . Other Nausea And Vomiting and Other (See Comments)    Pain medications cause severe vomiting. Tolerated slow IV morphine drip  . Amiodarone Nausea Only  . Prednisone Palpitations and Other (See Comments)    "Rapid Heart Beat"    Patient Measurements:   Heparin Dosing Weight: 63 kg  Vital Signs: Temp: 98.2 F (36.8 C) (10/06 1015) Temp Source: Oral (10/06 1015) BP: 112/84 (10/06 1345) Pulse Rate: 113 (10/06 1345)  Labs: Recent Labs    11/09/18 1040 11/09/18 1240  HGB 13.7  --   HCT 41.3  --   PLT 186  --   LABPROT 24.7*  --   INR 2.3*  --   CREATININE 1.29*  --   TROPONINIHS 50* 262*    CrCl cannot be calculated (Unknown ideal weight.).   Medical History: Past Medical History:  Diagnosis Date  . Bleeding behind the abdominal cavity 10/2017  . Chronic anticoagulation - coumadin, CHADS2VASC=6 05/17/2015  . CKD (chronic kidney disease), stage III ([place])   . Combined systolic and diastolic heart failure ([place])   . Coronary artery disease    a. s/p multiple stents - stenting x 2 to the LAD and x 1 to the LCx in 2000, rotational arthrectomy to proximal LCx and DES to LAD in 2014 and DES to LCx in 2014, along with DES to LAD for re-in-stent stenosis in 2015, and DES to ostial LAD in 2016. b. 07/2016 - orbital atherectomy & DES to mid LCx.  . Diverticulitis   . Granulosa cell carcinoma ([place])    abd; last episode was in 2009  . Hyperlipidemia   . Hypertension   . LBBB (left bundle branch block)   . Myocardial infarction ([place]) 2002  . Obesity   . OSA on CPAP   . Ovarian ca ([place]) 2019  . Pacemaker failure    a. Prior leadless PPM with premature battery failure, being managed conservatively without replacement.  . Peripheral vascular disease ([place])   . Permanent atrial fibrillation 2013  . Renal artery  stenosis ([place])   . Tachycardia-bradycardia syndrome ([place])    a. s/p leadless pacemaker ([name]) implanted by Dr [name]  . Varicose veins     Assessment: Patient is a 85 year old female who presents to the ED for evaluation of chest pain. She has history of coronary disease with multiple stents, chronic heart failure, cardiac pacemaker, atrial fibrillation (on coumadin), tachybradycardia syndrome, granulosis cell carcinoma, and HTN. Of note, her INR upon presentation was 2.3. Hs troponins are elevated 50>262. Hg/hct and plts are wnls.  Goal of Therapy:  Heparin level 0.3-0.7 units/ml Monitor platelets by anticoagulation protocol: Yes   Plan:  Start heparin (without bolus) when INR < 2. Monitor daily INR Continue to monitor H&H and platelets  Watch for signs/symptoms of bleeding  Sherren Kerns, PharmD PGY1 Acute Care Pharmacy Resident 11/09/2018,2:47 PM

## 2018-11-09 NOTE — H&P (Addendum)
Cardiology Admission History and Physical:   Patient ID: [name] MRN: [id number]; DOB: [birth year]   Admission date: 11/09/2018  Primary Care Provider: [name], MD Primary Cardiologist: Patty Grooms, MD  Primary Electrophysiologist:  Patty Grayer, MD   Chief Complaint:  Chest pressure   Patient Profile:   Patty [name] is a 85 y.o. female with  hx of DES x 2 LAD & DES CFX 2000, HSRA CFX & DES LAD 2014, DES CFX 2014, DES LAD for ISR 2015, DES oLAD 2016, HSRA & DES mCFX 2018, S-D-CHF, LBBB, pre-DM, HTN, HLD, perm Afib w/ tachy/brady s/p [name] leadless PPM in 2014 (but w/ premature battery failure)>>STJ dual-lead PPM in 2018, obesity, OSA on CPAP, ovarian CA 2019, CKD III presented with chest pain.   She has significant CAD with stenting x 2 to the LAD and x 1 to the LCx in 2000, rotational arthrectomy to proximal LCx and DES to LAD in 2014 and DES to LCx in 2014, along with DES to LAD for -in-stent stenosis in 2015, and DES to ostial LAD in 2016. Last cath 07/14/16 showed 100% CTO of OM1 with L-L collaterals, 90% distal Cx, 100% distal PLOM, otherwise nonobstructive disease, normal LVEDP - unsuccessful PCI of the distal Cx due to inability to cross the lesion with a balloon. She was brought back 07/15/16 and underwent successful orbital atherectomy &DES to mid LCx.   Last echo 10/2017 Nomral LV size with moderate LV hypertrophy. EF 40-45%, diffuse   hypokinesis with septal-lateral dyssynchrony consistent with LBBB   or RV pacing. Moderate diastolic dysfunction. Normal RV size and   systolic function. Mild MR, mild AI. Mild pulmonary hypertension.  She was admitted 10/2018 with after failing outpatient diuretics with acute CHF exacerbation.  She was diuresed significantly.  Discharge weight was 76.9 kg.  She was instructed to avoid high sodium diet.  Last seen by Patty [name] in clinic October 18, 2018.  She was doing well.  However weight gain up to 78.1kg.   History  of Present Illness:   Patty [name] is a poor historian.  Jumping one topic to another frequently.  She had sudden onset substernal chest pressure with jaw tightness this morning after waking up.  She took 3 sublingual nitroglycerin within 2 hours.  Symptoms improved but reoccurred and came to ER for further evaluation.  She reports intermittent shortness of breath and orthopnea.  She takes 2 tablets of Lasix in the morning and 1 in the afternoon.  She also on Spironolactone.  She reports intermittent missing her diuretics.  Reports not eating high sodium diet.  Lives with her by herself.  No exercise.  High-sensitivity troponin 50>> 262.  BNP 664.  Creatinine 1.29.  Chest x-ray without acute abnormality.  He is not tachycardic in 110s at blood pressure of 156/87.  Last blood pressure reading 112/84.  No recurrent chest pain since here.  She reports symptoms similar when she was here last month.   Heart Pathway Score:     Past Medical History:  Diagnosis Date  . Bleeding behind the abdominal cavity 10/2017  . Chronic anticoagulation - coumadin, CHADS2VASC=6 05/17/2015  . CKD (chronic kidney disease), stage III ([place])   . Combined systolic and diastolic heart failure ([place])   . Coronary artery disease    a. s/p multiple stents - stenting x 2 to the LAD and x 1 to the LCx in 2000, rotational arthrectomy to proximal LCx and DES to LAD in 2014 and DES to  LCx in 2014, along with DES to LAD for re-in-stent stenosis in 2015, and DES to ostial LAD in 2016. b. 07/2016 - orbital atherectomy & DES to mid LCx.  . Diverticulitis   . Granulosa cell carcinoma ([place])    abd; last episode was in 2009  . Hyperlipidemia   . Hypertension   . LBBB (left bundle branch block)   . Myocardial infarction ([place]) 2002  . Obesity   . OSA on CPAP   . Ovarian ca ([place]) 2019  . Pacemaker failure    a. Prior leadless PPM with premature battery failure, being managed conservatively without replacement.  . Peripheral vascular  disease ([place])   . Permanent atrial fibrillation 2013  . Renal artery stenosis ([place])   . Tachycardia-bradycardia syndrome ([place])    a. s/p leadless pacemaker ([name]) implanted by Dr [name]  . Varicose veins     Past Surgical History:  Procedure Laterality Date  . ABDOMINAL HYSTERECTOMY    . CARDIAC CATHETERIZATION  09/03/2007   EF 70%; Failed attempt at PCI to OM  . CARDIAC CATHETERIZATION  11/01/2003   EF 70%  . CARDIAC CATHETERIZATION N/A 12/14/2015   Procedure: Left Heart Cath and Coronary Angiography;  Surgeon: Burnell Blanks, MD;  Location: [place] CV LAB;  Service: Cardiovascular;  Laterality: N/A;  . CARDIOVERSION  12/31/2011   Procedure: CARDIOVERSION;  Surgeon: [name], MD;  Location: [facility] ENDOSCOPY;  Service: Cardiovascular;  Laterality: N/A;  . CARDIOVERSION N/A 12/31/2011   Procedure: CARDIOVERSION;  Surgeon: [name], MD;  Location: [facility] CATH LAB;  Service: Cardiovascular;  Laterality: N/A;  . CATARACT EXTRACTION, BILATERAL  2015  . CHOLECYSTECTOMY  1980's  . COLON SURGERY  2004   colectomy for diverticulosis  . CORONARY ANGIOPLASTY WITH STENT PLACEMENT  2000    and 08/11/2012; 11/12/2012: 3 + 2 LAD & CFX; 2nd CFX stent 11/12/2012  . CORONARY ATHERECTOMY N/A 07/15/2016   Procedure: Coronary Atherectomy;  Surgeon: Martinique, Peter M, MD;  Location: [place] CV LAB;  Service: Cardiovascular;  Laterality: N/A;  . CORONARY BALLOON ANGIOPLASTY N/A 07/14/2016   Procedure: Coronary Balloon Angioplasty;  Surgeon: Martinique, Peter M, MD;  Location: [place] CV LAB;  Service: Cardiovascular;  Laterality: N/A;  . CORONARY STENT INTERVENTION N/A 07/15/2016   Procedure: Coronary Stent Intervention;  Surgeon: Martinique, Peter M, MD;  Location: [place] CV LAB;  Service: Cardiovascular;  Laterality: N/A;  . FRACTIONAL FLOW RESERVE WIRE  10/07/2013   Procedure: [place];  Surgeon: [name], MD;  Location[facility] C[facility] LAB;  Service:  Cardiovascular;;  . HERNIA REPAIR  2005   "laparoscopic"  . IR IMAGING GUIDED PORT INSERTION  10/28/2017  . LEFT HEART CATH AND CORONARY ANGIOGRAPHY N/A 07/14/2016   Procedure: Left Heart Cath and Coronary Angiography;  Surgeon: Martinique, Peter M, MD;  Location: [place] CV LAB;  Service: Cardiovascular;  Laterality: N/A;  . LEFT HEART CATHETERIZATION WITH CORONARY ANGIOGRAM N/A 11/12/2012   Procedure: LEFT HEART CATHETERIZATION WITH CORONARY ANGIOGRAM;  Surgeon: [name], MD;  Location: [facility] C[facility] LAB;  Service: Cardiovascular;  Laterality: N/A;  . LEFT HEART CATHETERIZATION WITH CORONARY ANGIOGRAM N/A 10/07/2013   Procedure: LEFT HEART CATHETERIZATION WITH CORONARY ANGIOGRAM;  Surgeon: [name], MD;  Location: [facility] CATH LAB;  Service: Cardiovascular;  Laterality: N/A;  . LEFT HEART CATHETERIZATION WITH CORONARY ANGIOGRAM N/A 12/14/2013   Procedure: LEFT HEART CATHETERIZATION WITH CORONARY ANGIOGRAM;  Surgeon: Sinclair Grooms, MD;  Location:  [place] CATH LAB;  Service: Cardiovascular;  Laterality: N/A;  . LEFT HEART CATHETERIZATION WITH CORONARY ANGIOGRAM N/A 05/16/2014   Procedure: LEFT HEART CATHETERIZATION WITH CORONARY ANGIOGRAM;  Surgeon: Sherren Mocha, MD;  Location: [facility] CATH LAB;  Service: Cardiovascular;  Laterality: N/A;  . PERCUTANEOUS CORONARY INTERVENTION-BALLOON ONLY  08/04/2012   Procedure: PERCUTANEOUS CORONARY INTERVENTION-BALLOON ONLY;  Surgeon: [name], MD;  Location: [facility] CATH LAB;  Service: Cardiovascular;;  . PERCUTANEOUS CORONARY ROTOBLATOR INTERVENTION (PCI-R) N/A 08/11/2012   Procedure: PERCUTANEOUS CORONARY ROTOBLATOR INTERVENTION (PCI-R);  Surgeon: [name], MD;  Location: [facility] CATH LAB;  Service: Cardiovascular;  Laterality: N/A;  . PERCUTANEOUS CORONARY STENT INTERVENTION (PCI-S)  10/07/2013   Procedure: PERCUTANEOUS CORONARY STENT INTERVENTION (PCI-S);  Surgeon: [name], MD;  Location: [facility] CATH LAB;  Service: Cardiovascular;;  .  PERMANENT PACEMAKER INSERTION N/A 03/16/2012   [name] (SJM) leadless pacemaker (LEADLESS II STUDY PATEINT)  . SALIVARY GLAND SURGERY  2000's   "had a little lump removed; granulosa related; it was benign" (08/11/2012)  . TEE WITHOUT CARDIOVERSION  12/31/2011   Procedure: TRANSESOPHAGEAL ECHOCARDIOGRAM (TEE);  Surgeon: [name], MD;  Location: [facility] ENDOSCOPY;  Service: Cardiovascular;  Laterality: N/A;  . UMBILICAL GRANULOMA EXCISION  2000   2003; 2004; 2007: "all in my abdomen including small intestines, outside my ?uterus/etc" (08/11/2012)  . VARICOSE VEIN SURGERY Bilateral 1977     Medications Prior to Admission: Prior to Admission medications   Medication Sig Start Date End Date Taking? Authorizing Provider  acetaminophen (TYLENOL) 500 MG tablet Take 500 mg by mouth every 6 (six) hours as needed for mild pain or headache.    Yes [provider]  atorvastatin (LIPITOR) 80 MG tablet TAKE 1 TABLET BY MOUTH EVERY DAY IN THE MORNING Patient taking differently: Take 80 mg by mouth daily.  04/07/18  Yes [name], MD  beta carotene w/minerals (OCUVITE) tablet Take 1 tablet by mouth 2 (two) times daily.    Yes [provider]  Calcium Carb-Cholecalciferol (CALTRATE 600+D) 600-800 MG-UNIT TABS Take 1 tablet by mouth every evening.    Yes [provider]  clopidogrel (PLAVIX) 75 MG tablet Take 1 tablet (75 mg total) by mouth daily. 02/23/18  Yes [name], MD  Coenzyme Q-10 100 MG capsule Take 200 mg by mouth at bedtime.    Yes [provider]  diltiazem (CARDIZEM CD) 120 MG 24 hr capsule Take 1 capsule (120 mg total) by mouth at bedtime. 09/07/18  Yes [name], MD  diltiazem (CARDIZEM CD) 240 MG 24 hr capsule TAKE ONE CAPSULE BY MOUTH DAILY WITH BREAKFAST Patient taking differently: Take 240 mg by mouth daily with breakfast.  08/31/18  Yes [name], MD  docusate sodium (COLACE) 100 MG capsule Take 100 mg by mouth  daily as needed for mild constipation (IF NO B/M AFTER 2 DAYS).    Yes [provider]  furosemide (LASIX) 40 MG tablet 2 tabs by mouth every morning. 2 additional tabs daily as needed for wt gain/abdominal distension. Patient taking differently: Take 80 mg by mouth daily.  10/09/18  Yes Theora Gianotti, NP  gabapentin (NEURONTIN) 300 MG capsule TAKE 1 CAPSULE BY MOUTH TWICE A DAY Patient taking differently: Take 300 mg by mouth 2 (two) times daily.  08/02/18  Yes Gorsuch, Ni, MD  isosorbide mononitrate (IMDUR) 60 MG 24 hr tablet Take 1 tablet (60 mg total) by mouth daily. 02/19/18  Yes [name], MD  KLOR-CON M20  20 MEQ tablet TAKE 1 TABLET BY MOUTH EVERY DAY Patient taking differently: Take 20 mEq by mouth daily with supper.  07/28/18  Yes [name], MD  lidocaine-prilocaine (EMLA) cream Apply to [facility] site 1-2 hours prior to access 09/30/18  Yes Cross, Melissa D, NP  metoprolol tartrate (LOPRESSOR) 100 MG tablet TAKE 1 TABLET BY MOUTH TWICE A DAY Patient taking differently: Take 100 mg by mouth 2 (two) times daily.  08/31/18  Yes [name], MD  Multiple Vitamin (MULTIVITAMIN) capsule Take 1 capsule by mouth daily.   Yes [provider]  nitroGLYCERIN (NITROSTAT) 0.4 MG SL tablet PLACE 1 TABLET (0.4 MG TOTAL) UNDER THE TONGUE EVERY 5 (FIVE) MINUTES AS NEEDED FOR CHEST PAIN Patient taking differently: Place 0.4 mg under the tongue every 5 (five) minutes as needed for chest pain.  10/18/18  Yes Allred, Jeneen Rinks, MD  Omega-3 Fatty Acids (FISH OIL) 1200 MG CAPS Take 1,200 mg by mouth daily.    Yes [provider]  spironolactone (ALDACTONE) 25 MG tablet Take 25 mg by mouth See admin instructions. Take 25 mg by mouth every other day at 4 PM   Yes [provider]  tamoxifen (NOLVADEX) 10 MG tablet Take 1 tablet (10 mg total) by mouth daily. 10/22/18  Yes Cross, Melissa D, NP  warfarin (COUMADIN) 2 MG tablet TAKE AS DIRECTED BY COUMADIN  CLINIC. Patient taking differently: Take 1-2 mg by mouth See admin instructions. 1mg  on MON WED FRI and 2mg  on all other days 11/01/18  Yes [name], MD     Allergies:    Allergies  Allergen Reactions  . Bee Venom Anaphylaxis  . Other Nausea And Vomiting and Other (See Comments)    Pain medications cause severe vomiting. Tolerated slow IV morphine drip  . Amiodarone Nausea Only  . Prednisone Palpitations and Other (See Comments)    "Rapid Heart Beat"    Social History:   Social History   Socioeconomic History  . Marital status: Widowed    Spouse name: Not on file  . Number of children: 4  . Years of education: Not on file  . Highest education level: Not on file  Occupational History  . Occupation: Retired Nurse, learning disability estate  Social Needs  . Financial resource strain: Not on file  . Food insecurity    Worry: Not on file    Inability: Not on file  . Transportation needs    Medical: Not on file    Non-medical: Not on file  Tobacco Use  . Smoking status: Former Smoker    Packs/day: 1.00    Years: 32.00    Pack years: 32.00    Types: Cigarettes    Quit date: 02/03/1974    Years since quitting: 44.7  . Smokeless tobacco: Never Used  Substance and Sexual Activity  . Alcohol use: Yes    Alcohol/week: 5.0 standard drinks    Types: 5 Glasses of wine per week    Comment: occ  . Drug use: No  . Sexual activity: Never  Lifestyle  . Physical activity    Days per week: Not on file    Minutes per session: Not on file  . Stress: Not on file  Relationships  . Social Herbalist on phone: Not on file    Gets together: Not on file    Attends religious service: Not on file    Active member of club or organization: Not on file  Attends meetings of clubs or organizations: Not on file    Relationship status: Not on file  . Intimate partner violence    Fear of current or ex partner: Not on file    Emotionally abused: Not on file    Physically abused:  Not on file    Forced sexual activity: Not on file  Other Topics Concern  . Not on file  Social History Narrative   Lives alone.    Family History:  The patient's family history includes Diabetes in her brother and father; Heart attack in her brother and father; Hypertension in her brother and father; Kidney failure in her brother; Stroke in her mother.    ROS:  Please see the history of present illness.  All other ROS reviewed and negative.     Physical Exam/Data:   Vitals:   11/09/18 1300 11/09/18 1315 11/09/18 1330 11/09/18 1345  BP: 109/75 124/79 114/80 112/84  Pulse: (!) 107 (!) 111 (!) 111 (!) 113  Resp: 11 (!) 22 (!) 31 (!) 41  Temp:      TempSrc:      SpO2: 97% 96% 97% 95%   No intake or output data in the 24 hours ending 11/09/18 1456 Last 3 Weights 10/18/2018 10/09/2018 10/08/2018  Weight (lbs) 172 lb 3.2 oz 169 lb 8 oz 172 lb 9.6 oz  Weight (kg) 78.109 kg 76.885 kg 78.291 kg     There is no height or weight on file to calculate BMI.  General:  Well nourished, well developed, in no acute distress HEENT: normal Lymph: no adenopathy Neck: no JVD Endocrine:  No thryomegaly Vascular: No carotid bruits; FA pulses 2+ bilaterally without bruits  Cardiac:  normal S1, S2; RRR; no murmur  Lungs:  clear to auscultation bilaterally, no wheezing, rhonchi or rales  Abd: soft, nontender, no hepatomegaly  Ext: no edema Musculoskeletal:  No deformities, BUE and BLE strength normal and equal Skin: warm and dry  Neuro:  CNs 2-12 intact, no focal abnormalities noted Psych:  Normal affect    EKG:  The ECG that was done today was personally reviewed and demonstrates Paced rhythm at 113 bpm  Relevant CV Studies:  Echo 10/2017  Study Conclusions  - Left ventricle: The cavity size was normal. Wall thickness was   increased in a pattern of moderate LVH. Septal-lateral   dyssynchrony. Systolic function was mildly to moderately reduced.   The estimated ejection fraction was in the  range of 40% to 45%.   Diffuse hypokinesis. Features are consistent with a pseudonormal   left ventricular filling pattern, with concomitant abnormal   relaxation and increased filling pressure (grade 2 diastolic   dysfunction). - Aortic valve: Trileaflet; moderately calcified leaflets. There   was no stenosis. There was mild regurgitation. - Aorta: Mildly dilated ascending aorta. Ascending aortic diameter:   39 mm (S). - Mitral valve: Moderately calcified annulus. There was mild   regurgitation. - Left atrium: The atrium was moderately dilated. - Right ventricle: The cavity size was normal. Pacer wire or   catheter noted in right ventricle. Systolic function was normal. - Right atrium: The atrium was mildly dilated. - Tricuspid valve: Peak RV-RA gradient (S): 33 mm Hg. - Pulmonary arteries: PA peak pressure: 36 mm Hg (S). - Inferior vena cava: The vessel was normal in size. The   respirophasic diameter changes were in the normal range (>= 50%),   consistent with normal central venous pressure. - Pericardium, extracardiac: A trivial pericardial effusion was  identified.  Impressions:  - Nomral LV size with moderate LV hypertrophy. EF 40-45%, diffuse   hypokinesis with septal-lateral dyssynchrony consistent with LBBB   or RV pacing. Moderate diastolic dysfunction. Normal RV size and   systolic function. Mild MR, mild AI. Mild pulmonary hypertension.  Coronary Stent Intervention  07/2016  Coronary Atherectomy  Conclusion    Mid Cx lesion, 90 %stenosed.  A STENT PROMUS PREM MR 2.5X12 drug eluting stent was successfully placed.  Post intervention, there is a 0% residual stenosis.   1. Successful orbital atherectomy and stenting of the distal LCx with DES  Plan: continue DAPT with ASA/Plavix. Would discontinue ASA at one month. Resume Coumadin tonight. I would not bridge with IV heparin. Anticipate DC in am.    Coronary Balloon Angioplasty  07/2016  Left Heart Cath and  Coronary Angiography  Conclusion    Prox RCA lesion, 30 %stenosed.  Mid RCA-2 lesion, 30 %stenosed.  Mid RCA-1 lesion, 40 %stenosed.  Dist RCA lesion/PLOM, 100 %stenosed.  Ost RPDA to RPDA lesion, 50 %stenosed.  Ost Cx to Prox Cx lesion, 10 %stenosed.  Ost 1st Mrg to 1st Mrg lesion, 100 %stenosed.  Ost LAD to Mid LAD lesion, 20 %stenosed.  Acute Mrg lesion, 30 %stenosed.  Ost 2nd Mrg to 2nd Mrg lesion, 60 %stenosed.  Mid LAD lesion, 30 %stenosed.  LV end diastolic pressure is normal.  Mid Cx lesion, 90 %stenosed.  Post intervention, there is a 90% residual stenosis.   1. 2 vessel obstructive CAD    - 100% CTO of OM1 with left to left collaterals.    - 90% distal LCx    - 100% distal PLOM 2. Normal LVEDP 3. Unsuccessful PCI of the distal LCx due to inability to cross the lesion with a balloon.   Plan: will resume IV heparin this pm. Plan to return to cath lab for atherectomy and stenting of the distal LCx.      Laboratory Data:  High Sensitivity Troponin:   Recent Labs  Lab 11/09/18 1040 11/09/18 1240  TROPONINIHS 50* 262*      Chemistry Recent Labs  Lab 11/09/18 1040  NA 141  K 3.6  CL 104  CO2 28  GLUCOSE 98  BUN 27*  CREATININE 1.29*  CALCIUM 8.9  GFRNONAA 37*  GFRAA 43*  ANIONGAP 9    Hematology Recent Labs  Lab 11/09/18 1040  WBC 5.5  RBC 4.10  HGB 13.7  HCT 41.3  MCV 100.7*  MCH 33.4  MCHC 33.2  RDW 15.9*  PLT 186   BNP Recent Labs  Lab 11/09/18 1041  BNP 664.9*      Radiology/Studies:  Dg Chest Port 1 View  Result Date: 11/09/2018 CLINICAL DATA:  Chest pain EXAM: PORTABLE CHEST 1 VIEW COMPARISON:  10/08/2018 FINDINGS: Gross cardiomegaly with left chest multi lead pacer. Aortic atherosclerosis. Right chest port catheter. No acute abnormality of the lungs. Osseous structures are unremarkable. IMPRESSION: Gross cardiomegaly without acute abnormality of the lungs in AP portable projection. Electronically Signed   By:  Eddie Candle M.D.   On: 11/09/2018 11:02    Assessment and Plan:   1.  Chest pressure/elevated troponin with hx of CAD s/p multiple stents -Symptoms concerning for angina.  Relieved after sublingual nitroglycerin x3.  However  reports symptoms similar when she was here with CHF exacerbation last months. - High-sensitivity troponin 50>> 262. Cycle enzyme. Start heparin when INR less than 2. Cath tomorrow.   2.  Chronic combined CHF -BNP 664.  Chest x-ray without acute pulmonary edema.  Intermittently compliant with her diuretics.  Reports not eating high sodium diet.  She appears euvolemic.  Encourage compliant with her medication. Hold lasix tomorrow for cath.   3.  Persistent atrial fibrillation/flutter -Paced rhythm with heart rate of 110s. INR 2.3. hold coumadin for cath tomorrow. Start heparin when INR less than 2.  4. HTN -BP stable  5. HLD - No results found for requested labs within last 8760 hours.  - Continue statin  - Check lipid profile in AM  6. Tachybrady syndrome - STJ leadless PPM implanted 2014 for tachy/brady (early battery depletion); STJ dual chamber PPM implanted 2018 - Followed by Dr. [name].  - Device check 11/08/2018 showed  - AF burden 50-100% since April 2020. Lower burden @ 50% Sept-Oct 2020  - Presenting egm AF on Coumadin per record   - Increased average HR trends noted more recently.   Severity of Illness: The appropriate patient status for this patient is OBSERVATION. Observation status is judged to be reasonable and necessary in order to provide the required intensity of service to ensure the patient's safety. The patient's presenting symptoms, physical exam findings, and initial radiographic and laboratory data in the context of their medical condition is felt to place them at decreased risk for further clinical deterioration. Furthermore, it is anticipated that the patient will be medically stable for discharge from the hospital within 2 midnights of  admission. The following factors support the patient status of observation.   " The patient's presenting symptoms include Chest pain . " The physical exam findings include None " The initial radiographic and laboratory data are Elevated troponin.      For questions or updates, please contact Philip Please consult [email or web address] for contact info under        Jarrett Soho, PA  11/09/2018 2:56 PM

## 2018-11-09 NOTE — ED Notes (Signed)
Pt ambulated to RR and back to room 

## 2018-11-10 ENCOUNTER — Inpatient Hospital Stay (HOSPITAL_COMMUNITY)
Admission: EM | Disposition: A | Discharge status: Home Health | Payer: Self-pay | Source: Home / Self Care | Attending: Cardiovascular Disease

## 2018-11-10 DIAGNOSIS — I428 Other cardiomyopathies: Secondary | ICD-10-CM | POA: Diagnosis not present

## 2018-11-10 DIAGNOSIS — I4892 Unspecified atrial flutter: Secondary | ICD-10-CM | POA: Diagnosis not present

## 2018-11-10 DIAGNOSIS — Z950 Presence of cardiac pacemaker: Secondary | ICD-10-CM | POA: Diagnosis not present

## 2018-11-10 DIAGNOSIS — Z7901 Long term (current) use of anticoagulants: Secondary | ICD-10-CM | POA: Diagnosis not present

## 2018-11-10 DIAGNOSIS — I4821 Permanent atrial fibrillation: Secondary | ICD-10-CM | POA: Diagnosis not present

## 2018-11-10 DIAGNOSIS — I252 Old myocardial infarction: Secondary | ICD-10-CM | POA: Diagnosis not present

## 2018-11-10 DIAGNOSIS — I5033 Acute on chronic diastolic (congestive) heart failure: Secondary | ICD-10-CM | POA: Diagnosis not present

## 2018-11-10 DIAGNOSIS — I255 Ischemic cardiomyopathy: Secondary | ICD-10-CM | POA: Diagnosis present

## 2018-11-10 DIAGNOSIS — I484 Atypical atrial flutter: Secondary | ICD-10-CM | POA: Diagnosis not present

## 2018-11-10 DIAGNOSIS — I471 Supraventricular tachycardia: Secondary | ICD-10-CM | POA: Diagnosis not present

## 2018-11-10 DIAGNOSIS — E7800 Pure hypercholesterolemia, unspecified: Secondary | ICD-10-CM | POA: Diagnosis not present

## 2018-11-10 DIAGNOSIS — Z9049 Acquired absence of other specified parts of digestive tract: Secondary | ICD-10-CM | POA: Diagnosis not present

## 2018-11-10 DIAGNOSIS — E785 Hyperlipidemia, unspecified: Secondary | ICD-10-CM | POA: Diagnosis not present

## 2018-11-10 DIAGNOSIS — Z6833 Body mass index (BMI) 33.0-33.9, adult: Secondary | ICD-10-CM | POA: Diagnosis not present

## 2018-11-10 DIAGNOSIS — Z8543 Personal history of malignant neoplasm of ovary: Secondary | ICD-10-CM | POA: Diagnosis not present

## 2018-11-10 DIAGNOSIS — I495 Sick sinus syndrome: Secondary | ICD-10-CM | POA: Diagnosis not present

## 2018-11-10 DIAGNOSIS — I272 Pulmonary hypertension, unspecified: Secondary | ICD-10-CM | POA: Diagnosis present

## 2018-11-10 DIAGNOSIS — I5043 Acute on chronic combined systolic (congestive) and diastolic (congestive) heart failure: Secondary | ICD-10-CM | POA: Diagnosis not present

## 2018-11-10 DIAGNOSIS — Z20828 Contact with and (suspected) exposure to other viral communicable diseases: Secondary | ICD-10-CM | POA: Diagnosis not present

## 2018-11-10 DIAGNOSIS — I21A1 Myocardial infarction type 2: Secondary | ICD-10-CM | POA: Diagnosis not present

## 2018-11-10 DIAGNOSIS — I447 Left bundle-branch block, unspecified: Secondary | ICD-10-CM | POA: Diagnosis present

## 2018-11-10 DIAGNOSIS — I130 Hypertensive heart and chronic kidney disease with heart failure and stage 1 through stage 4 chronic kidney disease, or unspecified chronic kidney disease: Secondary | ICD-10-CM | POA: Diagnosis not present

## 2018-11-10 DIAGNOSIS — G4733 Obstructive sleep apnea (adult) (pediatric): Secondary | ICD-10-CM | POA: Diagnosis not present

## 2018-11-10 DIAGNOSIS — N183 Chronic kidney disease, stage 3 unspecified: Secondary | ICD-10-CM | POA: Diagnosis present

## 2018-11-10 DIAGNOSIS — Z90710 Acquired absence of both cervix and uterus: Secondary | ICD-10-CM | POA: Diagnosis not present

## 2018-11-10 DIAGNOSIS — I25110 Atherosclerotic heart disease of native coronary artery with unstable angina pectoris: Secondary | ICD-10-CM | POA: Diagnosis not present

## 2018-11-10 DIAGNOSIS — I4819 Other persistent atrial fibrillation: Secondary | ICD-10-CM | POA: Diagnosis not present

## 2018-11-10 DIAGNOSIS — I110 Hypertensive heart disease with heart failure: Secondary | ICD-10-CM | POA: Diagnosis not present

## 2018-11-10 DIAGNOSIS — R079 Chest pain, unspecified: Secondary | ICD-10-CM | POA: Diagnosis not present

## 2018-11-10 DIAGNOSIS — E669 Obesity, unspecified: Secondary | ICD-10-CM | POA: Diagnosis present

## 2018-11-10 DIAGNOSIS — Z955 Presence of coronary angioplasty implant and graft: Secondary | ICD-10-CM | POA: Diagnosis not present

## 2018-11-10 DIAGNOSIS — I10 Essential (primary) hypertension: Secondary | ICD-10-CM | POA: Diagnosis not present

## 2018-11-10 DIAGNOSIS — I214 Non-ST elevation (NSTEMI) myocardial infarction: Secondary | ICD-10-CM | POA: Diagnosis not present

## 2018-11-10 DIAGNOSIS — I480 Paroxysmal atrial fibrillation: Secondary | ICD-10-CM | POA: Diagnosis not present

## 2018-11-10 HISTORY — PX: LEFT HEART CATH AND CORONARY ANGIOGRAPHY: CATH118249

## 2018-11-10 LAB — PROTIME-INR
INR: 2 — ABNORMAL HIGH (ref 0.8–1.2)
INR: 1.8 — ABNORMAL HIGH (ref 0.8–1.2)
INR: 1.8 — ABNORMAL HIGH (ref 0.8–1.2)
Prothrombin Time: 22.4 seconds — ABNORMAL HIGH (ref 11.4–15.2)
Prothrombin Time: 20.6 seconds — ABNORMAL HIGH (ref 11.4–15.2)
Prothrombin Time: 20.9 seconds — ABNORMAL HIGH (ref 11.4–15.2)

## 2018-11-10 LAB — BASIC METABOLIC PANEL
Anion gap: 12 (ref 5–15)
BUN: 28 mg/dL — ABNORMAL HIGH (ref 8–23)
CO2: 26 mmol/L (ref 22–32)
Calcium: 8.9 mg/dL (ref 8.9–10.3)
Chloride: 104 mmol/L (ref 98–111)
Creatinine, Ser: 1.27 mg/dL — ABNORMAL HIGH (ref 0.44–1.00)
GFR calc Af Amer: 44 mL/min — ABNORMAL LOW (ref >60)
GFR calc non Af Amer: 38 mL/min — ABNORMAL LOW (ref >60)
Glucose, Bld: 94 mg/dL (ref 70–99)
Potassium: 3.8 mmol/L (ref 3.5–5.1)
Sodium: 142 mmol/L (ref 135–145)

## 2018-11-10 LAB — LIPID PANEL
Cholesterol: 112 mg/dL (ref 0–200)
HDL: 36 mg/dL — ABNORMAL LOW (ref >40)
LDL Cholesterol: 62 mg/dL (ref 0–99)
Total CHOL/HDL Ratio: 3.1 RATIO
Triglycerides: 69 mg/dL (ref <150)
VLDL: 14 mg/dL (ref 0–40)

## 2018-11-10 LAB — CBC
HCT: 41.6 % (ref 36.0–46.0)
HCT: 41.6 % (ref 36.0–46.0)
Hemoglobin: 13.3 g/dL (ref 12.0–15.0)
Hemoglobin: 13.5 g/dL (ref 12.0–15.0)
MCH: 32.8 pg (ref 26.0–34.0)
MCH: 32.7 pg (ref 26.0–34.0)
MCHC: 32 g/dL (ref 30.0–36.0)
MCHC: 32.5 g/dL (ref 30.0–36.0)
MCV: 102.5 fL — ABNORMAL HIGH (ref 80.0–100.0)
MCV: 100.7 fL — ABNORMAL HIGH (ref 80.0–100.0)
Platelets: 192 10*3/uL (ref 150–400)
Platelets: 209 10*3/uL (ref 150–400)
RBC: 4.06 MIL/uL (ref 3.87–5.11)
RBC: 4.13 MIL/uL (ref 3.87–5.11)
RDW: 15.9 % — ABNORMAL HIGH (ref 11.5–15.5)
RDW: 16 % — ABNORMAL HIGH (ref 11.5–15.5)
WBC: 5.5 10*3/uL (ref 4.0–10.5)
WBC: 5.6 10*3/uL (ref 4.0–10.5)
nRBC: 0 % (ref 0.0–0.2)
nRBC: 0 % (ref 0.0–0.2)

## 2018-11-10 SURGERY — LEFT HEART CATH AND CORONARY ANGIOGRAPHY
Anesthesia: LOCAL

## 2018-11-10 MED ORDER — HEPARIN (PORCINE) 25000 UT/250ML-% IV SOLN
750.0000 [IU]/h | INTRAVENOUS | Status: DC
  Administered 2018-11-10: 750 [IU]/h via INTRAVENOUS
  Filled 2018-11-10: qty 250

## 2018-11-10 MED ORDER — SODIUM CHLORIDE 0.9 % IV SOLN: 250.0000 mL | INTRAVENOUS | Status: DC | PRN

## 2018-11-10 MED ORDER — METOPROLOL TARTRATE 5 MG/5ML IV SOLN
INTRAVENOUS | Status: DC | PRN
  Administered 2018-11-10 (×2): 2.5 mg via INTRAVENOUS

## 2018-11-10 MED ORDER — METOPROLOL TARTRATE 5 MG/5ML IV SOLN
INTRAVENOUS | Status: AC
  Filled 2018-11-10: qty 5

## 2018-11-10 MED ORDER — HEPARIN (PORCINE) 25000 UT/250ML-% IV SOLN
900.0000 [IU]/h | INTRAVENOUS | Status: DC
  Administered 2018-11-11 – 2018-11-12 (×2): 900 [IU]/h via INTRAVENOUS
  Filled 2018-11-10 (×2): qty 250

## 2018-11-10 MED ORDER — SODIUM CHLORIDE 0.9 % IV SOLN
INTRAVENOUS | Status: DC
  Administered 2018-11-10 – 2018-11-11 (×2): via INTRAVENOUS

## 2018-11-10 MED ORDER — VERAPAMIL HCL 2.5 MG/ML IV SOLN
INTRAVENOUS | Status: AC
  Filled 2018-11-10: qty 2

## 2018-11-10 MED ORDER — FENTANYL CITRATE (PF) 100 MCG/2ML IJ SOLN
INTRAMUSCULAR | Status: AC
  Filled 2018-11-10: qty 2

## 2018-11-10 MED ORDER — FENTANYL CITRATE (PF) 100 MCG/2ML IJ SOLN
INTRAMUSCULAR | Status: DC | PRN
  Administered 2018-11-10: 25 ug via INTRAVENOUS

## 2018-11-10 MED ORDER — HEPARIN (PORCINE) IN NACL 1000-0.9 UT/500ML-% IV SOLN
INTRAVENOUS | Status: AC
  Filled 2018-11-10: qty 1000

## 2018-11-10 MED ORDER — VERAPAMIL HCL 2.5 MG/ML IV SOLN
INTRAVENOUS | Status: DC | PRN
  Administered 2018-11-10: 10 mL via INTRA_ARTERIAL

## 2018-11-10 MED ORDER — HEPARIN (PORCINE) IN NACL 1000-0.9 UT/500ML-% IV SOLN
INTRAVENOUS | Status: DC | PRN
  Administered 2018-11-10 (×2): 500 mL

## 2018-11-10 MED ORDER — SODIUM CHLORIDE 0.9% FLUSH: 3.0000 mL | INTRAVENOUS | Status: DC | PRN

## 2018-11-10 MED ORDER — IOHEXOL 350 MG/ML SOLN
INTRAVENOUS | Status: DC | PRN
  Administered 2018-11-10: 125 mL via INTRA_ARTERIAL

## 2018-11-10 MED ORDER — LIDOCAINE HCL (PF) 1 % IJ SOLN
INTRAMUSCULAR | Status: AC
  Filled 2018-11-10: qty 30

## 2018-11-10 MED ORDER — HEPARIN SODIUM (PORCINE) 1000 UNIT/ML IJ SOLN
INTRAMUSCULAR | Status: AC
  Filled 2018-11-10: qty 1

## 2018-11-10 MED ORDER — SODIUM CHLORIDE 0.9% FLUSH
3.0000 mL | Freq: Two times a day (BID) | INTRAVENOUS | Status: DC
  Administered 2018-11-11 – 2018-11-13 (×3): 3 mL via INTRAVENOUS

## 2018-11-10 SURGICAL SUPPLY — 14 items
CATH INFINITI 5 FR 3DRC (CATHETERS) ×2 IMPLANT
CATH INFINITI 5 FR AR1 MOD (CATHETERS) ×2 IMPLANT
CATH INFINITI JR4 5F (CATHETERS) ×2 IMPLANT
CATH OPTITORQUE TIG 4.0 5F (CATHETERS) ×2 IMPLANT
DEVICE RAD COMP TR BAND LRG (VASCULAR PRODUCTS) ×2 IMPLANT
GLIDESHEATH SLEND SS 6F .021 (SHEATH) ×2 IMPLANT
GUIDEWIRE INQWIRE 1.5J.035X260 (WIRE) ×2 IMPLANT
INQWIRE 1.5J .035X260CM (WIRE) ×4
KIT HEART LEFT (KITS) ×2 IMPLANT
PACK CARDIAC CATHETERIZATION (CUSTOM PROCEDURE TRAY) ×2 IMPLANT
SHEATH PROBE COVER 6X72 (BAG) ×2 IMPLANT
TRANSDUCER W/STOPCOCK (MISCELLANEOUS) ×2 IMPLANT
TUBING CIL FLEX 10 FLL-RA (TUBING) ×2 IMPLANT
WIRE HI TORQ VERSACORE-J 145CM (WIRE) ×2 IMPLANT

## 2018-11-10 NOTE — Progress Notes (Signed)
Progress Note  Patient Name: Patty [name] Date of Encounter: 11/10/2018  Primary Cardiologist: Larae Grooms, MD   Subjective   Feeling well. No chest pain, sob or palpitations.  Some reason her fluids stopped and never started on heparin.   Inpatient Medications    Scheduled Meds: . atorvastatin  80 mg Oral Daily  . calcium carbonate  1 tablet Oral QPM   And  . cholecalciferol  800 Units Oral QPM  . Chlorhexidine Gluconate Cloth  6 each Topical Daily  . clopidogrel  75 mg Oral Daily  . diltiazem  120 mg Oral QHS  . furosemide  80 mg Oral Daily  . gabapentin  300 mg Oral BID  . isosorbide mononitrate  60 mg Oral Daily  . metoprolol tartrate  100 mg Oral BID  . multivitamin  1 tablet Oral BID  . multivitamin with minerals  1 tablet Oral Daily  . potassium chloride SA  20 mEq Oral Q supper  . sodium chloride flush  10-40 mL Intracatheter Q12H  . sodium chloride flush  3 mL Intravenous Q12H  . spironolactone  25 mg Oral QODAY  . tamoxifen  10 mg Oral Daily   Continuous Infusions: . sodium chloride    . sodium chloride     PRN Meds: sodium chloride, acetaminophen, nitroGLYCERIN, ondansetron (ZOFRAN) IV, sodium chloride flush, sodium chloride flush   Vital Signs    Vitals:   11/09/18 2100 11/09/18 2321 11/10/18 0344 11/10/18 0844  BP: 131/74 139/61 (!) 141/79 (!) 146/58  Pulse: 66 (!) 59  62  Resp:  18 (!) 24 16  Temp: 98.4 F (36.9 C) 98.1 F (36.7 C) 98.2 F (36.8 C) 99.3 F (37.4 C)  TempSrc: Oral Oral Oral Oral  SpO2: 94% 97%  96%  Weight:      Height:        Intake/Output Summary (Last 24 hours) at 11/10/2018 1035 Last data filed at 11/10/2018 0800 Gross per 24 hour  Intake -  Output 200 ml  Net -200 ml   Last 3 Weights 11/09/2018 10/18/2018 10/09/2018  Weight (lbs) 167 lb 8.8 oz 172 lb 3.2 oz 169 lb 8 oz  Weight (kg) 76 kg 78.109 kg 76.885 kg      Telemetry    Paced rhythm at 70s - Personally Reviewed  ECG    N/A  Physical Exam    GEN: No acute distress.   Neck: No JVD Cardiac: RRR, no murmurs, rubs, or gallops.  Respiratory: Clear to auscultation bilaterally. GI: Soft, nontender, non-distended  MS: No edema; No deformity. Neuro:  Nonfocal  Psych: Normal affect   Labs    High Sensitivity Troponin:   Recent Labs  Lab 11/09/18 1040 11/09/18 1240 11/09/18 1623 11/09/18 1840  TROPONINIHS 50* 262* 906* 1,130*      Chemistry Recent Labs  Lab 11/09/18 1040 11/10/18 0536  NA 141 142  K 3.6 3.8  CL 104 104  CO2 28 26  GLUCOSE 98 94  BUN 27* 28*  CREATININE 1.29* 1.27*  CALCIUM 8.9 8.9  GFRNONAA 37* 38*  GFRAA 43* 44*  ANIONGAP 9 12     Hematology Recent Labs  Lab 11/09/18 1040 11/10/18 0536  WBC 5.5 5.5  RBC 4.10 4.06  HGB 13.7 13.3  HCT 41.3 41.6  MCV 100.7* 102.5*  MCH 33.4 32.8  MCHC 33.2 32.0  RDW 15.9* 15.9*  PLT 186 192    BNP Recent Labs  Lab 11/09/18 1041  BNP 664.9*  Radiology    Dg Chest Port 1 View  Result Date: 11/09/2018 CLINICAL DATA:  Chest pain EXAM: PORTABLE CHEST 1 VIEW COMPARISON:  10/08/2018 FINDINGS: Gross cardiomegaly with left chest multi lead pacer. Aortic atherosclerosis. Right chest port catheter. No acute abnormality of the lungs. Osseous structures are unremarkable. IMPRESSION: Gross cardiomegaly without acute abnormality of the lungs in AP portable projection. Electronically Signed   By: Eddie Candle M.D.   On: 11/09/2018 11:02    Cardiac Studies   Pending cath   Patient Profile     Patty [name] is a 85 y.o. female with hx of DES x 2 LAD & DES CFX 2000, HSRA CFX & DES LAD 2014, DES CFX 2014, DES LAD for ISR 2015, DES oLAD 2016, HSRA & DES mCFX 2018, S-D-CHF, LBBB, pre-DM, HTN, HLD, perm Afib w/ tachy/brady s/p [name] leadless PPM in 2014 (but w/ premature battery failure)>>STJ dual-lead PPM in 2018, obesity, OSA on CPAP, ovarian CA 2019, CKD III presented with chest pain.   Assessment & Plan    1. NSTEMI with hx of CAD s/p multiple  stents  - Troponin peaked at 1130 yesterday. INR was 2 this morning. Start heparin. Will start fluids at 50cc/hr. Some reason it was stopped after starting KVO. Continue Plavix, statin, Imdur and BB.   2.  Persistent atrial fibrillation/flutter - Coumadin on hold. INR 2. Start heparin.  - Continue current dose of Cardizem and lopressor.   3. Chronic combined CHF --BNP 664.  Chest x-ray without acute pulmonary edema. - Euvolemic - Held lasix today for cath - Continue spironolactone   4. SSS s/p PPM  5. CKD III - Scre stable at 1.1-1.2 - Fluids prior to cath   6. HLD - 11/10/2018: Cholesterol 112; HDL 36; LDL Cholesterol 62; Triglycerides 69; VLDL 14  - continue statin   For questions or updates, please contact [place] Please consult [email or web address] for contact info under        SignedLeanor Kail, PA  11/10/2018, 10:35 AM

## 2018-11-10 NOTE — Interval H&P Note (Signed)
Cath Lab Visit (complete for each Cath Lab visit)  Clinical Evaluation Leading to the Procedure:   ACS: Yes.   NSTEMI  Non-ACS:  n/a   History and Physical Interval Note:  11/10/2018 4:10 PM  Patty [name]  has presented today for surgery, with the diagnosis of unstable angina.  The various methods of treatment have been discussed with the patient and family. After consideration of risks, benefits and other options for treatment, the patient has consented to  Procedure(s): LEFT HEART CATH AND CORONARY ANGIOGRAPHY (N/A) as a surgical intervention.  The patient's history has been reviewed, patient examined, no change in status, stable for surgery.  I have reviewed the patient's chart and labs.  Questions were answered to the patient's satisfaction.     Kathlyn [place]

## 2018-11-10 NOTE — Progress Notes (Signed)
[place] for Heparin Indication: chest pain/ACS  Allergies  Allergen Reactions  . Bee Venom Anaphylaxis  . Other Nausea And Vomiting and Other (See Comments)    Pain medications cause severe vomiting. Tolerated slow IV morphine drip  . Amiodarone Nausea Only  . Prednisone Palpitations and Other (See Comments)    "Rapid Heart Beat"    Patient Measurements: Height: 5' (152.4 cm) Weight: 167 lb 8.8 oz (76 kg) IBW/kg (Calculated) : 45.5 Heparin Dosing Weight: 63 kg  Vital Signs: Temp: 98.5 F (36.9 C) (10/07 1738) Temp Source: Oral (10/07 1738) BP: 165/92 (10/07 1723) Pulse Rate: 113 (10/07 1738)  Labs: Recent Labs    11/09/18 1040 11/09/18 1240 11/09/18 1623 11/09/18 1840 11/10/18 0536 11/10/18 1434  HGB 13.7  --   --   --  13.3  --   HCT 41.3  --   --   --  41.6  --   PLT 186  --   --   --  192  --   LABPROT 24.7*  --   --   --  22.4* 20.6*  INR 2.3*  --   --   --  2.0* 1.8*  CREATININE 1.29*  --   --   --  1.27*  --   TROPONINIHS 50* 262* 906* 1,130*  --   --     Estimated Creatinine Clearance: 29 mL/min (A) (by C-G formula based on SCr of 1.27 mg/dL (H)).   Assessment: Patient is a 84 year old female who presents to the ED for evaluation of chest pain. She has history of coronary disease with multiple stents, chronic heart failure, cardiac pacemaker, atrial fibrillation (on coumadin), tachybradycardia syndrome, granulosis cell carcinoma, and HTN. Of note, her INR upon presentation was 2.3 and today= 1.8  She is now s/p cath with multivessel CAD and EF 20-25%.  Pharmacy consulted to restart heparin (sheath removed ~ 5:30pm; TR band placed) -prior heparin rate was 750 units/hr   Goal of Therapy:  Heparin level 0.3-0.7 units/ml Monitor platelets by anticoagulation protocol: Yes   Plan:  -Restart heparin at  750 units/hr at 1:30am on 10/8 -Heparin level in 8 hours and daily wth CBC daily  Hildred Laser, PharmD Clinical  Pharmacist **Pharmacist phone directory can now be found on [email or web address] ([email or web address]).  Listed under [place].

## 2018-11-10 NOTE — H&P (View-Only) (Signed)
Progress Note  Patient Name: Patty [name] Date of Encounter: 11/10/2018  Primary Cardiologist: Larae Grooms, MD   Subjective   Feeling well. No chest pain, sob or palpitations.  Some reason her fluids stopped and never started on heparin.   Inpatient Medications    Scheduled Meds: . atorvastatin  80 mg Oral Daily  . calcium carbonate  1 tablet Oral QPM   And  . cholecalciferol  800 Units Oral QPM  . Chlorhexidine Gluconate Cloth  6 each Topical Daily  . clopidogrel  75 mg Oral Daily  . diltiazem  120 mg Oral QHS  . furosemide  80 mg Oral Daily  . gabapentin  300 mg Oral BID  . isosorbide mononitrate  60 mg Oral Daily  . metoprolol tartrate  100 mg Oral BID  . multivitamin  1 tablet Oral BID  . multivitamin with minerals  1 tablet Oral Daily  . potassium chloride SA  20 mEq Oral Q supper  . sodium chloride flush  10-40 mL Intracatheter Q12H  . sodium chloride flush  3 mL Intravenous Q12H  . spironolactone  25 mg Oral QODAY  . tamoxifen  10 mg Oral Daily   Continuous Infusions: . sodium chloride    . sodium chloride     PRN Meds: sodium chloride, acetaminophen, nitroGLYCERIN, ondansetron (ZOFRAN) IV, sodium chloride flush, sodium chloride flush   Vital Signs    Vitals:   11/09/18 2100 11/09/18 2321 11/10/18 0344 11/10/18 0844  BP: 131/74 139/61 (!) 141/79 (!) 146/58  Pulse: 66 (!) 59  62  Resp:  18 (!) 24 16  Temp: 98.4 F (36.9 C) 98.1 F (36.7 C) 98.2 F (36.8 C) 99.3 F (37.4 C)  TempSrc: Oral Oral Oral Oral  SpO2: 94% 97%  96%  Weight:      Height:        Intake/Output Summary (Last 24 hours) at 11/10/2018 1035 Last data filed at 11/10/2018 0800 Gross per 24 hour  Intake -  Output 200 ml  Net -200 ml   Last 3 Weights 11/09/2018 10/18/2018 10/09/2018  Weight (lbs) 167 lb 8.8 oz 172 lb 3.2 oz 169 lb 8 oz  Weight (kg) 76 kg 78.109 kg 76.885 kg      Telemetry    Paced rhythm at 70s - Personally Reviewed  ECG    N/A  Physical Exam    GEN: No acute distress.   Neck: No JVD Cardiac: RRR, no murmurs, rubs, or gallops.  Respiratory: Clear to auscultation bilaterally. GI: Soft, nontender, non-distended  MS: No edema; No deformity. Neuro:  Nonfocal  Psych: Normal affect   Labs    High Sensitivity Troponin:   Recent Labs  Lab 11/09/18 1040 11/09/18 1240 11/09/18 1623 11/09/18 1840  TROPONINIHS 50* 262* 906* 1,130*      Chemistry Recent Labs  Lab 11/09/18 1040 11/10/18 0536  NA 141 142  K 3.6 3.8  CL 104 104  CO2 28 26  GLUCOSE 98 94  BUN 27* 28*  CREATININE 1.29* 1.27*  CALCIUM 8.9 8.9  GFRNONAA 37* 38*  GFRAA 43* 44*  ANIONGAP 9 12     Hematology Recent Labs  Lab 11/09/18 1040 11/10/18 0536  WBC 5.5 5.5  RBC 4.10 4.06  HGB 13.7 13.3  HCT 41.3 41.6  MCV 100.7* 102.5*  MCH 33.4 32.8  MCHC 33.2 32.0  RDW 15.9* 15.9*  PLT 186 192    BNP Recent Labs  Lab 11/09/18 1041  BNP 664.9*  Radiology    Dg Chest Port 1 View  Result Date: 11/09/2018 CLINICAL DATA:  Chest pain EXAM: PORTABLE CHEST 1 VIEW COMPARISON:  10/08/2018 FINDINGS: Gross cardiomegaly with left chest multi lead pacer. Aortic atherosclerosis. Right chest port catheter. No acute abnormality of the lungs. Osseous structures are unremarkable. IMPRESSION: Gross cardiomegaly without acute abnormality of the lungs in AP portable projection. Electronically Signed   By: Eddie Candle M.D.   On: 11/09/2018 11:02    Cardiac Studies   Pending cath   Patient Profile     Patty [name] is a 85 y.o. female with hx of DES x 2 LAD & DES CFX 2000, HSRA CFX & DES LAD 2014, DES CFX 2014, DES LAD for ISR 2015, DES oLAD 2016, HSRA & DES mCFX 2018, S-D-CHF, LBBB, pre-DM, HTN, HLD, perm Afib w/ tachy/brady s/p [name] leadless PPM in 2014 (but w/ premature battery failure)>>STJ dual-lead PPM in 2018, obesity, OSA on CPAP, ovarian CA 2019, CKD III presented with chest pain.   Assessment & Plan    1. NSTEMI with hx of CAD s/p multiple  stents  - Troponin peaked at 1130 yesterday. INR was 2 this morning. Start heparin. Will start fluids at 50cc/hr. Some reason it was stopped after starting KVO. Continue Plavix, statin, Imdur and BB.   2.  Persistent atrial fibrillation/flutter - Coumadin on hold. INR 2. Start heparin.  - Continue current dose of Cardizem and lopressor.   3. Chronic combined CHF --BNP 664.  Chest x-ray without acute pulmonary edema. - Euvolemic - Held lasix today for cath - Continue spironolactone   4. SSS s/p PPM  5. CKD III - Scre stable at 1.1-1.2 - Fluids prior to cath   6. HLD - 11/10/2018: Cholesterol 112; HDL 36; LDL Cholesterol 62; Triglycerides 69; VLDL 14  - continue statin   For questions or updates, please contact [place] Please consult [email or web address] for contact info under        SignedLeanor Kail, PA  11/10/2018, 10:35 AM

## 2018-11-10 NOTE — Progress Notes (Signed)
[place] for Heparin Indication: chest pain/ACS  Allergies  Allergen Reactions  . Bee Venom Anaphylaxis  . Other Nausea And Vomiting and Other (See Comments)    Pain medications cause severe vomiting. Tolerated slow IV morphine drip  . Amiodarone Nausea Only  . Prednisone Palpitations and Other (See Comments)    "Rapid Heart Beat"    Patient Measurements: Height: 5' (152.4 cm) Weight: 167 lb 8.8 oz (76 kg) IBW/kg (Calculated) : 45.5 Heparin Dosing Weight: 63 kg  Vital Signs: Temp: 98.2 F (36.8 C) (10/07 0344) Temp Source: Oral (10/07 0344) BP: 141/79 (10/07 0344) Pulse Rate: 59 (10/06 2321)  Labs: Recent Labs    11/09/18 1040 11/09/18 1240 11/09/18 1623 11/09/18 1840 11/10/18 0536  HGB 13.7  --   --   --  13.3  HCT 41.3  --   --   --  41.6  PLT 186  --   --   --  192  LABPROT 24.7*  --   --   --  22.4*  INR 2.3*  --   --   --  2.0*  CREATININE 1.29*  --   --   --  1.27*  TROPONINIHS 50* 262* 906* 1,130*  --     Estimated Creatinine Clearance: 29 mL/min (A) (by C-G formula based on SCr of 1.27 mg/dL (H)).   Medical History: Past Medical History:  Diagnosis Date  . Bleeding behind the abdominal cavity 10/2017  . Chronic anticoagulation - coumadin, CHADS2VASC=6 05/17/2015  . CKD (chronic kidney disease), stage III ([place])   . Combined systolic and diastolic heart failure ([place])   . Coronary artery disease    a. s/p multiple stents - stenting x 2 to the LAD and x 1 to the LCx in 2000, rotational arthrectomy to proximal LCx and DES to LAD in 2014 and DES to LCx in 2014, along with DES to LAD for re-in-stent stenosis in 2015, and DES to ostial LAD in 2016. b. 07/2016 - orbital atherectomy & DES to mid LCx.  . Diverticulitis   . Granulosa cell carcinoma ([place])    abd; last episode was in 2009  . Hyperlipidemia   . Hypertension   . LBBB (left bundle branch block)   . Myocardial infarction ([place]) 2002  . Obesity   . OSA on CPAP    . Ovarian ca ([place]) 2019  . Pacemaker failure    a. Prior leadless PPM with premature battery failure, being managed conservatively without replacement.  . Peripheral vascular disease ([place])   . Permanent atrial fibrillation 2013  . Renal artery stenosis ([place])   . Tachycardia-bradycardia syndrome ([place])    a. s/p leadless pacemaker ([name]) implanted by Dr [name]  . Varicose veins     Assessment: Patient is a 85 year old female who presents to the ED for evaluation of chest pain. She has history of coronary disease with multiple stents, chronic heart failure, cardiac pacemaker, atrial fibrillation (on coumadin), tachybradycardia syndrome, granulosis cell carcinoma, and HTN. Of note, her INR upon presentation was 2.3. Hs troponins are elevated 50>262. Hg/hct and plts are wnls.  INR down to 2.0 this morning, cardiology would like heparin to start now, orders entered.   Goal of Therapy:  Heparin level 0.3-0.7 units/ml Monitor platelets by anticoagulation protocol: Yes   Plan:  Start heparin at 750 units/hr Follow up after cath Continue to monitor H&H and platelets  Watch for signs/symptoms of bleeding  Erin Hearing PharmD., BCPS Clinical  Pharmacist 11/10/2018 8:36 AM

## 2018-11-11 ENCOUNTER — Other Ambulatory Visit (HOSPITAL_COMMUNITY): Payer: Medicare Other

## 2018-11-11 ENCOUNTER — Other Ambulatory Visit: Payer: Self-pay

## 2018-11-11 ENCOUNTER — Encounter (HOSPITAL_COMMUNITY): Payer: Self-pay | Admitting: Cardiovascular Disease

## 2018-11-11 DIAGNOSIS — I495 Sick sinus syndrome: Secondary | ICD-10-CM

## 2018-11-11 DIAGNOSIS — I4821 Permanent atrial fibrillation: Secondary | ICD-10-CM

## 2018-11-11 DIAGNOSIS — I428 Other cardiomyopathies: Secondary | ICD-10-CM

## 2018-11-11 DIAGNOSIS — I5043 Acute on chronic combined systolic (congestive) and diastolic (congestive) heart failure: Secondary | ICD-10-CM

## 2018-11-11 LAB — BASIC METABOLIC PANEL
Anion gap: 7 (ref 5–15)
Anion gap: 7 (ref 5–15)
BUN: 28 mg/dL — ABNORMAL HIGH (ref 8–23)
BUN: 30 mg/dL — ABNORMAL HIGH (ref 8–23)
CO2: 26 mmol/L (ref 22–32)
CO2: 26 mmol/L (ref 22–32)
Calcium: 8.3 mg/dL — ABNORMAL LOW (ref 8.9–10.3)
Calcium: 8.4 mg/dL — ABNORMAL LOW (ref 8.9–10.3)
Chloride: 105 mmol/L (ref 98–111)
Chloride: 106 mmol/L (ref 98–111)
Creatinine, Ser: 1.35 mg/dL — ABNORMAL HIGH (ref 0.44–1.00)
Creatinine, Ser: 1.34 mg/dL — ABNORMAL HIGH (ref 0.44–1.00)
GFR calc Af Amer: 41 mL/min — ABNORMAL LOW (ref >60)
GFR calc Af Amer: 41 mL/min — ABNORMAL LOW (ref >60)
GFR calc non Af Amer: 35 mL/min — ABNORMAL LOW (ref >60)
GFR calc non Af Amer: 36 mL/min — ABNORMAL LOW (ref >60)
Glucose, Bld: 98 mg/dL (ref 70–99)
Glucose, Bld: 97 mg/dL (ref 70–99)
Potassium: 3.7 mmol/L (ref 3.5–5.1)
Potassium: 4.1 mmol/L (ref 3.5–5.1)
Sodium: 138 mmol/L (ref 135–145)
Sodium: 139 mmol/L (ref 135–145)

## 2018-11-11 LAB — TROPONIN I (HIGH SENSITIVITY)
Troponin I (High Sensitivity): 1177 ng/L (ref <18)
Troponin I (High Sensitivity): 976 ng/L (ref <18)

## 2018-11-11 LAB — PROTIME-INR
INR: 1.7 — ABNORMAL HIGH (ref 0.8–1.2)
Prothrombin Time: 19.3 seconds — ABNORMAL HIGH (ref 11.4–15.2)

## 2018-11-11 LAB — CBC
HCT: 42.8 % (ref 36.0–46.0)
Hemoglobin: 14.1 g/dL (ref 12.0–15.0)
MCH: 32.9 pg (ref 26.0–34.0)
MCHC: 32.9 g/dL (ref 30.0–36.0)
MCV: 100 fL (ref 80.0–100.0)
Platelets: 185 10*3/uL (ref 150–400)
RBC: 4.28 MIL/uL (ref 3.87–5.11)
RDW: 15.9 % — ABNORMAL HIGH (ref 11.5–15.5)
WBC: 6.1 10*3/uL (ref 4.0–10.5)
nRBC: 0 % (ref 0.0–0.2)

## 2018-11-11 LAB — HEPARIN LEVEL (UNFRACTIONATED)
Heparin Unfractionated: 0.26 IU/mL — ABNORMAL LOW (ref 0.30–0.70)
Heparin Unfractionated: 0.53 IU/mL (ref 0.30–0.70)

## 2018-11-11 LAB — MAGNESIUM: Magnesium: 2.1 mg/dL (ref 1.7–2.4)

## 2018-11-11 MED ORDER — DOFETILIDE 125 MCG PO CAPS
125.0000 ug | ORAL_CAPSULE | Freq: Two times a day (BID) | ORAL | Status: DC
  Administered 2018-11-12 – 2018-11-16 (×9): 125 ug via ORAL
  Filled 2018-11-11 (×12): qty 1

## 2018-11-11 MED ORDER — SACUBITRIL-VALSARTAN 24-26 MG PO TABS
1.0000 | ORAL_TABLET | Freq: Two times a day (BID) | ORAL | Status: DC
  Administered 2018-11-11: 1 via ORAL
  Filled 2018-11-11: qty 1

## 2018-11-11 MED ORDER — WARFARIN SODIUM 2 MG PO TABS
2.0000 mg | ORAL_TABLET | Freq: Once | ORAL | Status: AC
  Administered 2018-11-11: 2 mg via ORAL
  Filled 2018-11-11: qty 1

## 2018-11-11 MED ORDER — SODIUM CHLORIDE 0.9% FLUSH
3.0000 mL | Freq: Two times a day (BID) | INTRAVENOUS | Status: DC
  Administered 2018-11-11 – 2018-11-13 (×4): 3 mL via INTRAVENOUS

## 2018-11-11 MED ORDER — FUROSEMIDE 10 MG/ML IJ SOLN
80.0000 mg | Freq: Once | INTRAMUSCULAR | Status: AC
  Administered 2018-11-11: 11:00:00 80 mg via INTRAVENOUS
  Filled 2018-11-11: qty 8

## 2018-11-11 MED ORDER — POTASSIUM CHLORIDE CRYS ER 20 MEQ PO TBCR
20.0000 meq | EXTENDED_RELEASE_TABLET | Freq: Once | ORAL | Status: AC
  Administered 2018-11-11: 20 meq via ORAL

## 2018-11-11 MED ORDER — SODIUM CHLORIDE 0.9% FLUSH: 3.0000 mL | INTRAVENOUS | Status: DC | PRN

## 2018-11-11 MED ORDER — SODIUM CHLORIDE 0.9 % IV SOLN: 250.0000 mL | INTRAVENOUS | Status: DC | PRN

## 2018-11-11 MED ORDER — FUROSEMIDE 80 MG PO TABS
80.0000 mg | ORAL_TABLET | Freq: Every day | ORAL | Status: DC
  Administered 2018-11-12 – 2018-11-16 (×5): 80 mg via ORAL
  Filled 2018-11-11 (×5): qty 1

## 2018-11-11 MED ORDER — POTASSIUM CHLORIDE CRYS ER 20 MEQ PO TBCR
40.0000 meq | EXTENDED_RELEASE_TABLET | Freq: Once | ORAL | Status: AC
  Administered 2018-11-11: 40 meq via ORAL
  Filled 2018-11-11: qty 2

## 2018-11-11 MED ORDER — METOPROLOL SUCCINATE ER 100 MG PO TB24
200.0000 mg | ORAL_TABLET | Freq: Every day | ORAL | Status: DC
  Administered 2018-11-11 – 2018-11-16 (×6): 200 mg via ORAL
  Filled 2018-11-11 (×7): qty 2

## 2018-11-11 MED ORDER — WARFARIN - PHARMACIST DOSING INPATIENT
Freq: Every day | Status: DC
  Administered 2018-11-12 – 2018-11-15 (×4)

## 2018-11-11 MED FILL — Heparin Sodium (Porcine) Inj 1000 Unit/ML: INTRAMUSCULAR | Qty: 10 | Status: AC

## 2018-11-11 MED FILL — Lidocaine HCl Local Preservative Free (PF) Inj 1%: INTRAMUSCULAR | Qty: 30 | Status: AC

## 2018-11-11 NOTE — Progress Notes (Signed)
Patty [name] for Heparin, coumadin Indication: chest pain/ACS  Allergies  Allergen Reactions  . Bee Venom Anaphylaxis  . Other Nausea And Vomiting and Other (See Comments)    Pain medications cause severe vomiting. Tolerated slow IV morphine drip  . Amiodarone Nausea Only  . Prednisone Palpitations and Other (See Comments)    "Rapid Heart Beat"    Patient Measurements: Height: 5' (152.4 cm) Weight: 167 lb 8.8 oz (76 kg) IBW/kg (Calculated) : 45.5 Heparin Dosing Weight: 63 kg  Vital Signs: Temp: 97.8 F (36.6 C) (10/08 0830) Temp Source: Oral (10/08 0830) BP: 156/117 (10/08 1040) Pulse Rate: 93 (10/08 1040)  Labs: Recent Labs    11/09/18 1040  11/09/18 1840 11/10/18 0536 11/10/18 1434 11/10/18 1930 11/11/18 0209 11/11/18 0637 11/11/18 0819 11/11/18 0930  HGB 13.7  --   --  13.3  --  13.5 14.1  --   --   --   HCT 41.3  --   --  41.6  --  41.6 42.8  --   --   --   PLT 186  --   --  192  --  209 185  --   --   --   LABPROT 24.7*  --   --  22.4* 20.6* 20.9* 19.3*  --   --   --   INR 2.3*  --   --  2.0* 1.8* 1.8* 1.7*  --   --   --   HEPARINUNFRC  --   --   --   --   --   --   --   --   --  0.26*  CREATININE 1.29*  --   --  1.27*  --   --   --   --   --   --   TROPONINIHS 50*   < > 1,130*  --   --   --   --  1,177* 976*  --    < > = values in this interval not displayed.    Estimated Creatinine Clearance: 29 mL/min (A) (by C-G formula based on SCr of 1.27 mg/dL (H)).   Assessment: Patient is a 85 year old female who presents to the ED for evaluation of chest pain. She is noted with NSTEMI s/p cath on heparin (on warfarin PTA for afib).   She is now s/p cath with multivessel CAD and EF 20-25%.  Pharmacy consulted to dose heparin and coumadin -heparin level= 0.26, INR= 1.7, CBC stable  Home warfarin dose: 2mg /d except 1mg  on MWF (last dose on 10/5)  Goal of Therapy:  Heparin level 0.3-0.7 units/ml Monitor platelets by  anticoagulation protocol: Yes   Plan:  --Increase heparin to 900 units/hr -Heparin level in 8 hours and daily wth CBC daily -Warfarin 2mg  po today -Daily PT/INR    Hildred Laser, PharmD Clinical Pharmacist **Pharmacist phone directory can now be found on [email or web address] ([email or web address]).  Listed under [place].

## 2018-11-11 NOTE — Progress Notes (Signed)
Pharmacy: Dofetilide (Tikosyn) - Initial Consult Assessment and Electrolyte Replacement  Pharmacy consulted to assist in monitoring and replacing electrolytes in this 85 y.o. female admitted on 11/09/2018 undergoing dofetilide initiation. First dofetilide dose: not yet scheduled  Assessment:  Patient Exclusion Criteria: If any screening criteria checked as "Yes", then  patient  should NOT receive dofetilide until criteria item is corrected.  If "Yes" please indicate correction plan.  YES  NO Patient  Exclusion Criteria Correction Plan   [x]   []   Baseline QTc interval is greater than or equal to 440 msec. IF above YES box checked dofetilide contraindicated unless patient has ICD; then may proceed if QTc 500-550 msec or with known ventricular conduction abnormalities may proceed with QTc 550-600 msec. QTc = 0.45 Reviewed QTc with Oda Kilts (PA) - ok to continue.    []   [x]   Patient is known or suspected to have a digoxin level greater than 2 ng/ml: No results found for: DIGOXIN     []   [x]   Creatinine clearance less than 20 ml/min (calculated using Cockcroft-Gault, actual body weight and serum creatinine): Estimated Creatinine Clearance: 29 mL/min (A) (by C-G formula based on SCr of 1.27 mg/dL (H)).     []   [x]  Patient has received drugs known to prolong the QT intervals within the last 48 hours (phenothiazines, tricyclics or tetracyclic antidepressants, erythromycin, H-1 antihistamines, cisapride, fluoroquinolones, azithromycin). Drugs not listed above may have an, as yet, undetected potential to prolong the QT interval, updated information on QT prolonging agents is available at this website:QT prolonging agents or www.crediblemeds.org Note, Tamoxifen may have a possible risk of prolonging QTc - but evidence is lacking. Monitor closely with start of dofetilide.    []   [x]   Patient received a dose of hydrochlorothiazide ([place]) alone or in any combination including triamterene  (Dyazide, Maxzide) in the last 48 hours.    []   [x]  Patient received a medication known to increase dofetilide plasma concentrations prior to initial dofetilide dose:  . Trimethoprim (Primsol, Proloprim) in the last 36 hours . Verapamil (Calan, Verelan) in the last 36 hours or a sustained release dose in the last 72 hours . Megestrol (Megace) in the last 5 days  . Cimetidine (Tagamet) in the last 6 hours . Ketoconazole (Nizoral) in the last 24 hours . Itraconazole (Sporanox) in the last 48 hours  . Prochlorperazine (Compazine) in the last 36 hours     []   [x]   Patient is known to have a history of torsades de pointes; congenital or acquired long QT syndromes.    []   [x]   Patient has received a Class 1 antiarrhythmic with less than 2 half-lives since last dose. (Disopyramide, Quinidine, Procainamide, Lidocaine, Mexiletine, Flecainide, Propafenone)    []   [x]   Patient has received amiodarone therapy in the past 3 months or amiodarone level is greater than 0.3 ng/ml.    Patient has been appropriately anticoagulated with IV Heparin and Warfarin.  Labs:    Component Value Date/Time   K 3.7 11/11/2018 1630   K 4.2 12/14/2014 1324   MG 2.1 11/11/2018 1630     Plan: Potassium: Patient ordered to receive 66mEq po x1 at 1600PM (lab 3.7 drawn prior to this supplementation).  K 3.5-3.7:  Hold Tikosyn initiation and give KCl 60 mEq po x1 and repeat BMET 2hr after dose - repeat appropriate dose if K < 4     *Due to prior order of 20 mEq given at 1722, will give another  KCl 40 mEq  po x1 to equal total of 60 mEq this PM and repeat BMET in 2 hours.    Magnesium: Mg >2: Appropriate to initiate Tikosyn, no replacement needed     Thank you for allowing pharmacy to participate in this patient's care   Sloan Leiter, PharmD, BCPS, [place] Clinical Pharmacist Clinical phone 11/11/2018 until 11PM [phone] Please refer to [facility] for [place] numbers 11/11/2018  3:48 PM

## 2018-11-11 NOTE — Progress Notes (Signed)
Visit made to patients room to offer CPAP for the night she states she is not going to wear it tonight.

## 2018-11-11 NOTE — Progress Notes (Signed)
Pharmacy: Dofetilide (Tikosyn) - Follow Up Assessment and Electrolyte Replacement  Pharmacy consulted to assist in monitoring and replacing electrolytes in this 85 y.o. female admitted on 11/09/2018 undergoing dofetilide initiation. First dofetilide dose: 125 mcg on 10/8 PM.   Labs:    Component Value Date/Time   K 4.1 11/11/2018 2040   K 4.2 12/14/2014 1324   MG 2.1 11/11/2018 1630     Plan: Potassium: K >/= 4: No additional supplementation needed  Magnesium: Mg > 2: No additional supplementation needed   Thank you for allowing pharmacy to participate in this patient's care   Brain Hilts 11/11/2018  9:21 PM

## 2018-11-11 NOTE — Progress Notes (Signed)
[place] for Heparin Indication: chest pain/ACS  Allergies  Allergen Reactions  . Bee Venom Anaphylaxis  . Other Nausea And Vomiting and Other (See Comments)    Pain medications cause severe vomiting. Tolerated slow IV morphine drip  . Amiodarone Nausea Only  . Prednisone Palpitations and Other (See Comments)    "Rapid Heart Beat"    Patient Measurements: Height: 5' (152.4 cm) Weight: 171 lb 4.8 oz (77.7 kg) IBW/kg (Calculated) : 45.5 Heparin Dosing Weight: 63 kg  Vital Signs: Temp: 97.5 F (36.4 C) (10/08 2051) Temp Source: Oral (10/08 2051) BP: 132/75 (10/08 2051) Pulse Rate: 112 (10/08 2051)  Labs: Recent Labs    11/09/18 1040  11/09/18 1840 11/10/18 0536 11/10/18 1434 11/10/18 1930 11/11/18 0209 11/11/18 0637 11/11/18 0819 11/11/18 0930 11/11/18 1630 11/11/18 2040  HGB 13.7  --   --  13.3  --  13.5 14.1  --   --   --   --   --   HCT 41.3  --   --  41.6  --  41.6 42.8  --   --   --   --   --   PLT 186  --   --  192  --  209 185  --   --   --   --   --   LABPROT 24.7*  --   --  22.4* 20.6* 20.9* 19.3*  --   --   --   --   --   INR 2.3*  --   --  2.0* 1.8* 1.8* 1.7*  --   --   --   --   --   HEPARINUNFRC  --   --   --   --   --   --   --   --   --  0.26*  --  0.53  CREATININE 1.29*  --   --  1.27*  --   --   --   --   --   --  1.35*  --   TROPONINIHS 50*   < > 1,130*  --   --   --   --  1,177* 976*  --   --   --    < > = values in this interval not displayed.    Estimated Creatinine Clearance: 27.6 mL/min (A) (by C-G formula based on SCr of 1.35 mg/dL (H)).   Assessment: Patient is a 85 year old female who presents to the ED for evaluation of chest pain. She is noted with NSTEMI s/p cath on heparin (on warfarin PTA for afib).   Heparin level 0.53 after increase to 900 units/hr - therapeutic.  Warfarin dose given tonight.  No bleeding or issues with line reported.   Goal of Therapy:  Heparin level 0.3-0.7  units/ml Monitor platelets by anticoagulation protocol: Yes   Plan:  Continue IV heparin at 900 units/hr Follow-up daily heparin level and CBC  Sloan Leiter, PharmD, BCPS, BCCCP Clinical Pharmacist Clinical phone 11/11/2018 until 10:30P [phone] **Pharmacist phone directory can now be found on [email or web address] ([email or web address]).  Listed under [place].

## 2018-11-11 NOTE — Progress Notes (Addendum)
Late Entry for 11/10/18 @ 2025: Patient seen and assessed. Physical assessment completed via computerized charting per [facility] policy. Nurse tech at bedside performing linen change. Pt has refused to use pure wick at this time. Patient makes inquiry in regards to bedrest post cardiac cath procedure. Staff nurse will follow up. Explained to patient that heparin drip will resume 11/11/18 at 0130; verbalized understanding   Side rail up times 2. Bed in lowest position and locked.   Nursing call light answered. Patient complains of jaw tightness. Vital signs obtained and entered into computerized charting. 0.4 mg SL Nitro given   11/11/18 @ 0341- on call Cardiologist notified r/t 2 episodes of chest pain relieved by 0.4 mg NTG.   11/11/18 @ [id number]- Second paged placed to on call cardiology r/t 3rd episode of chest pain/jaw pain relieved with 0.4 mg NTG sl x 1. Also made inquiry about resuming home dose of cardiazem  11/11/18 @ 0610: Dr. Stanford Breed (cardiology) returns page. Update given on patient condition with episode x 3 of chest pain/jaw pain relieved with NTG sl x 1 each episode. Instructed to call on call PA. Will execute request  11/11/18 @ 0614: On call PA for cardiology paged at request of Dr. Stanford Breed to evaluate patient at bedside   11/11/18 @ 0620- On call PA (Cadence Purth, PA-C). Update given on condition. Verbal order received to obtain STAT EKG and Troponin. Will execute order.

## 2018-11-11 NOTE — Consult Note (Addendum)
ELECTROPHYSIOLOGY CONSULT NOTE    Patient ID: Patty [name] MRN: [id number], DOB/AGE: Jan 21, 1931 85 y.o.  Admit date: 11/09/2018 Date of Consult: 11/11/2018  Primary Physician: [name], MD Primary Cardiologist: Larae Grooms, MD  Electrophysiologist: Dr. [name]  Referring Provider: Dr. Oval Linsey  Patient Profile: Patty [name] is a 85 y.o. female with a history of CAD s/p multiple stenting, permanent atrial fibrillation, HTN, OSA, and symptomatic sinus brady s/p St Jude dual PPM 2018  who is being seen today for the evaluation of afib with poor rate control at the request of Dr. Oval Linsey.  HPI:  Patty [name] is a 85 y.o. female who presented with unstable angina. Troponin peaked at 1130, noted to also be in Afib with RVR.  Taken for [facility] 11/10/2018 which showed patent stents and no target lesions; Trop thought to be 2/2 demand ischemia in the setting of Afib vs flutter with RVR.  She is feeling only a little better this am. She remains SOB with exertion, and when her heart rate is elevated, during conversation. She continues to have a mild chest pressure, which has correlated with her rapid heart rates. This was occurring at home as well, more often lately, and with shorter periods of relief from NTG.  No specific aggravating factors, could happen at rest or with exertion. No syncope, occasional tachypalpitations.   She remembers having nausea on amio, and thinks she only took it for a few days. She is not on daily zofran due to being on gabapentin, so would be willing to retry.  Looking back at chart, she was also noted to have elevated LFTs on amiodarone. (AST as high as 236, ALT as high as 185) that seemed to improve off of amiodarone.   Device history: STJ leadless PPM implanted 2014 for tachy/brady (early battery depletion); STJ dual chamber PPM implanted 2018  Past Medical History:  Diagnosis Date  . Bleeding behind the abdominal cavity 10/2017  . Chronic  anticoagulation - coumadin, CHADS2VASC=6 05/17/2015  . CKD (chronic kidney disease), stage III   . Combined systolic and diastolic heart failure ([place])   . Coronary artery disease    a. s/p multiple stents - stenting x 2 to the LAD and x 1 to the LCx in 2000, rotational arthrectomy to proximal LCx and DES to LAD in 2014 and DES to LCx in 2014, along with DES to LAD for re-in-stent stenosis in 2015, and DES to ostial LAD in 2016. b. 07/2016 - orbital atherectomy & DES to mid LCx.  . Diverticulitis   . Granulosa cell carcinoma ([place])    abd; last episode was in 2009  . Hyperlipidemia   . Hypertension   . LBBB (left bundle branch block)   . Myocardial infarction ([place]) 2002  . Obesity   . OSA on CPAP   . Ovarian ca ([place]) 2019  . Pacemaker failure    a. Prior leadless PPM with premature battery failure, being managed conservatively without replacement.  . Peripheral vascular disease ([place])   . Permanent atrial fibrillation ([place]) 2013  . Renal artery stenosis ([place])   . Tachycardia-bradycardia syndrome ([place])    a. s/p leadless pacemaker ([name]) implanted by Dr [name]  . Varicose veins      Surgical History:  Past Surgical History:  Procedure Laterality Date  . ABDOMINAL HYSTERECTOMY    . CARDIAC CATHETERIZATION  09/03/2007   EF 70%; Failed attempt at PCI to OM  . CARDIAC CATHETERIZATION  11/01/2003   EF 70%  .  CARDIAC CATHETERIZATION N/A 12/14/2015   Procedure: Left Heart Cath and Coronary Angiography;  Surgeon: Burnell Blanks, MD;  Location: [place] CV LAB;  Service: Cardiovascular;  Laterality: N/A;  . CARDIOVERSION  12/31/2011   Procedure: CARDIOVERSION;  Surgeon: [name], MD;  Location: [facility] ENDOSCOPY;  Service: Cardiovascular;  Laterality: N/A;  . CARDIOVERSION N/A 12/31/2011   Procedure: CARDIOVERSION;  Surgeon: [name], MD;  Location: [facility] CATH LAB;  Service: Cardiovascular;  Laterality: N/A;  . CATARACT EXTRACTION, BILATERAL  2015  .  CHOLECYSTECTOMY  1980's  . COLON SURGERY  2004   colectomy for diverticulosis  . CORONARY ANGIOPLASTY WITH STENT PLACEMENT  2000    and 08/11/2012; 11/12/2012: 3 + 2 LAD & CFX; 2nd CFX stent 11/12/2012  . CORONARY ATHERECTOMY N/A 07/15/2016   Procedure: Coronary Atherectomy;  Surgeon: Martinique, Peter M, MD;  Location: [place] CV LAB;  Service: Cardiovascular;  Laterality: N/A;  . CORONARY BALLOON ANGIOPLASTY N/A 07/14/2016   Procedure: Coronary Balloon Angioplasty;  Surgeon: Martinique, Peter M, MD;  Location: [place] CV LAB;  Service: Cardiovascular;  Laterality: N/A;  . CORONARY STENT INTERVENTION N/A 07/15/2016   Procedure: Coronary Stent Intervention;  Surgeon: Martinique, Peter M, MD;  Location: [place] CV LAB;  Service: Cardiovascular;  Laterality: N/A;  . FRACTIONAL FLOW RESERVE WIRE  10/07/2013   Procedure: [place];  Surgeon: [name], MD;  Location: [facility] CATH LAB;  Service: Cardiovascular;;  . HERNIA REPAIR  2005   "laparoscopic"  . IR IMAGING GUIDED PORT INSERTION  10/28/2017  . LEFT HEART CATH AND CORONARY ANGIOGRAPHY N/A 07/14/2016   Procedure: Left Heart Cath and Coronary Angiography;  Surgeon: Martinique, Peter M, MD;  Location: [place] CV LAB;  Service: Cardiovascular;  Laterality: N/A;  . LEFT HEART CATH AND CORONARY ANGIOGRAPHY N/A 11/10/2018   Procedure: LEFT HEART CATH AND CORONARY ANGIOGRAPHY;  Surgeon: Wellington Hampshire, MD;  Location: [place] CV LAB;  Service: Cardiovascular;  Laterality: N/A;  . LEFT HEART CATHETERIZATION WITH CORONARY ANGIOGRAM N/A 11/12/2012   Procedure: LEFT HEART CATHETERIZATION WITH CORONARY ANGIOGRAM;  Surgeon: [name], MD;  Location: [facility] CATH LAB;  Service: Cardiovascular;  Laterality: N/A;  . LEFT HEART CATHETERIZATION WITH CORONARY ANGIOGRAM N/A 10/07/2013   Procedure: LEFT HEART CATHETERIZATION WITH CORONARY ANGIOGRAM;  Surgeon: [name], MD;  Location: [facility] CATH LAB;  Service: Cardiovascular;   Laterality: N/A;  . LEFT HEART CATHETERIZATION WITH CORONARY ANGIOGRAM N/A 12/14/2013   Procedure: LEFT HEART CATHETERIZATION WITH CORONARY ANGIOGRAM;  Surgeon: Sinclair Grooms, MD;  Location: [facility] CATH LAB;  Service: Cardiovascular;  Laterality: N/A;  . LEFT HEART CATHETERIZATION WITH CORONARY ANGIOGRAM N/A 05/16/2014   Procedure: LEFT HEART CATHETERIZATION WITH CORONARY ANGIOGRAM;  Surgeon: Sherren Mocha, MD;  Location: [facility] CATH LAB;  Service: Cardiovascular;  Laterality: N/A;  . PERCUTANEOUS CORONARY INTERVENTION-BALLOON ONLY  08/04/2012   Procedure: PERCUTANEOUS CORONARY INTERVENTION-BALLOON ONLY;  Surgeon: [name], MD;  Location: [facility] CATH LAB;  Service: Cardiovascular;;  . PERCUTANEOUS CORONARY ROTOBLATOR INTERVENTION (PCI-R) N/A 08/11/2012   Procedure: PERCUTANEOUS CORONARY ROTOBLATOR INTERVENTION (PCI-R);  Surgeon: [name], MD;  Location: [facility] CATH LAB;  Service: Cardiovascular;  Laterality: N/A;  . PERCUTANEOUS CORONARY STENT INTERVENTION (PCI-S)  10/07/2013   Procedure: PERCUTANEOUS CORONARY STENT INTERVENTION (PCI-S);  Surgeon: [name], MD;  Location: [facility] CATH LAB;  Service: Cardiovascular;;  . PERMANENT PACEMAKER INSERTION N/A 03/16/2012   [name] (SJM) leadless pacemaker (LEADLESS II STUDY PATEINT)  .  SALIVARY GLAND SURGERY  2000's   "had a little lump removed; granulosa related; it was benign" (08/11/2012)  . TEE WITHOUT CARDIOVERSION  12/31/2011   Procedure: TRANSESOPHAGEAL ECHOCARDIOGRAM (TEE);  Surgeon: [name], MD;  Location: [facility] ENDOSCOPY;  Service: Cardiovascular;  Laterality: N/A;  . UMBILICAL GRANULOMA EXCISION  2000   2003; 2004; 2007: "all in my abdomen including small intestines, outside my ?uterus/etc" (08/11/2012)  . VARICOSE VEIN SURGERY Bilateral 1977     Medications Prior to Admission  Medication Sig Dispense Refill Last Dose  . acetaminophen (TYLENOL) 500 MG tablet Take 500 mg by mouth every 6 (six) hours as needed for mild pain  or headache.    Past Week at Unknown time  . atorvastatin (LIPITOR) 80 MG tablet TAKE 1 TABLET BY MOUTH EVERY DAY IN THE MORNING (Patient taking differently: Take 80 mg by mouth daily. ) 90 tablet 3 11/09/2018 at Unknown time  . beta carotene w/minerals (OCUVITE) tablet Take 1 tablet by mouth 2 (two) times daily.    11/09/2018 at Unknown time  . Calcium Carb-Cholecalciferol (CALTRATE 600+D) 600-800 MG-UNIT TABS Take 1 tablet by mouth every evening.    11/08/2018 at Unknown time  . clopidogrel (PLAVIX) 75 MG tablet Take 1 tablet (75 mg total) by mouth daily. 90 tablet 3 11/09/2018 at Unknown time  . Coenzyme Q-10 100 MG capsule Take 200 mg by mouth at bedtime.    11/08/2018 at Unknown time  . diltiazem (CARDIZEM CD) 120 MG 24 hr capsule Take 1 capsule (120 mg total) by mouth at bedtime. 90 capsule 1 11/08/2018 at Unknown time  . diltiazem (CARDIZEM CD) 240 MG 24 hr capsule TAKE ONE CAPSULE BY MOUTH DAILY WITH BREAKFAST (Patient taking differently: Take 240 mg by mouth daily with breakfast. ) 30 capsule 11 11/09/2018 at Unknown time  . docusate sodium (COLACE) 100 MG capsule Take 100 mg by mouth daily as needed for mild constipation (IF NO B/M AFTER 2 DAYS).    Past Month at Unknown time  . furosemide (LASIX) 40 MG tablet 2 tabs by mouth every morning. 2 additional tabs daily as needed for wt gain/abdominal distension. (Patient taking differently: Take 80 mg by mouth daily. ) 90 tablet 3 11/09/2018 at Unknown time  . gabapentin (NEURONTIN) 300 MG capsule TAKE 1 CAPSULE BY MOUTH TWICE A DAY (Patient taking differently: Take 300 mg by mouth 2 (two) times daily. ) 60 capsule 8 11/09/2018 at Unknown time  . isosorbide mononitrate (IMDUR) 60 MG 24 hr tablet Take 1 tablet (60 mg total) by mouth daily. 90 tablet 3 11/09/2018 at Unknown time  . KLOR-CON M20 20 MEQ tablet TAKE 1 TABLET BY MOUTH EVERY DAY (Patient taking differently: Take 20 mEq by mouth daily with supper. ) 90 tablet 1 11/08/2018 at Unknown time  .  lidocaine-prilocaine (EMLA) cream Apply to PAC site 1-2 hours prior to access 30 g 1 Past Month at Unknown time  . metoprolol tartrate (LOPRESSOR) 100 MG tablet TAKE 1 TABLET BY MOUTH TWICE A DAY (Patient taking differently: Take 100 mg by mouth 2 (two) times daily. ) 60 tablet 9 11/09/2018 at 0830  . Multiple Vitamin (MULTIVITAMIN) capsule Take 1 capsule by mouth daily.   11/09/2018 at Unknown time  . nitroGLYCERIN (NITROSTAT) 0.4 MG SL tablet PLACE 1 TABLET (0.4 MG TOTAL) UNDER THE TONGUE EVERY 5 (FIVE) MINUTES AS NEEDED FOR CHEST PAIN (Patient taking differently: Place 0.4 mg under the tongue every 5 (five) minutes as needed for chest pain. )  75 tablet 2 11/09/2018 at Unknown time  . Omega-3 Fatty Acids (FISH OIL) 1200 MG CAPS Take 1,200 mg by mouth daily.    11/09/2018 at Unknown time  . spironolactone (ALDACTONE) 25 MG tablet Take 25 mg by mouth See admin instructions. Take 25 mg by mouth every other day at 4 PM   Past Week at Unknown time  . tamoxifen (NOLVADEX) 10 MG tablet Take 1 tablet (10 mg total) by mouth daily. 30 tablet 3 11/09/2018 at Unknown time  . warfarin (COUMADIN) 2 MG tablet TAKE AS DIRECTED BY COUMADIN CLINIC. (Patient taking differently: Take 1-2 mg by mouth See admin instructions. 1mg  on MON WED FRI and 2mg  on all other days) 30 tablet 3 11/08/2018 at 2200    Inpatient Medications:  . atorvastatin  80 mg Oral Daily  . calcium carbonate  1 tablet Oral QPM   And  . cholecalciferol  800 Units Oral QPM  . Chlorhexidine Gluconate Cloth  6 each Topical Daily  . clopidogrel  75 mg Oral Daily  . furosemide  80 mg Oral Daily  . gabapentin  300 mg Oral BID  . isosorbide mononitrate  60 mg Oral Daily  . metoprolol tartrate  100 mg Oral BID  . multivitamin  1 tablet Oral BID  . multivitamin with minerals  1 tablet Oral Daily  . potassium chloride SA  20 mEq Oral Q supper  . sodium chloride flush  10-40 mL Intracatheter Q12H  . sodium chloride flush  3 mL Intravenous Q12H  .  spironolactone  25 mg Oral QODAY  . tamoxifen  10 mg Oral Daily    Allergies:  Allergies  Allergen Reactions  . Bee Venom Anaphylaxis  . Other Nausea And Vomiting and Other (See Comments)    Pain medications cause severe vomiting. Tolerated slow IV morphine drip  . Amiodarone Nausea Only  . Prednisone Palpitations and Other (See Comments)    "Rapid Heart Beat"    Social History   Socioeconomic History  . Marital status: Widowed    Spouse name: Not on file  . Number of children: 4  . Years of education: Not on file  . Highest education level: Not on file  Occupational History  . Occupation: Retired Nurse, learning disability estate  Social Needs  . Financial resource strain: Not on file  . Food insecurity    Worry: Not on file    Inability: Not on file  . Transportation needs    Medical: Not on file    Non-medical: Not on file  Tobacco Use  . Smoking status: Former Smoker    Packs/day: 1.00    Years: 32.00    Pack years: 32.00    Types: Cigarettes    Quit date: 02/03/1974    Years since quitting: 44.8  . Smokeless tobacco: Never Used  Substance and Sexual Activity  . Alcohol use: Yes    Alcohol/week: 5.0 standard drinks    Types: 5 Glasses of wine per week    Comment: occ  . Drug use: No  . Sexual activity: Never  Lifestyle  . Physical activity    Days per week: Not on file    Minutes per session: Not on file  . Stress: Not on file  Relationships  . Social Herbalist on phone: Not on file    Gets together: Not on file    Attends religious service: Not on file    Active member of club or organization: Not on  file    Attends meetings of clubs or organizations: Not on file    Relationship status: Not on file  . Intimate partner violence    Fear of current or ex partner: Not on file    Emotionally abused: Not on file    Physically abused: Not on file    Forced sexual activity: Not on file  Other Topics Concern  . Not on file  Social History Narrative    Lives alone.     Family History  Problem Relation Age of Onset  . Stroke Mother   . Heart attack Father   . Diabetes Father   . Hypertension Father   . Heart attack Brother   . Diabetes Brother   . Hypertension Brother   . Kidney failure Brother      Review of Systems: All other systems reviewed and are otherwise negative except as noted above.  Physical Exam: Vitals:   11/11/18 0433 11/11/18 0630 11/11/18 0636 11/11/18 0830  BP:  (!) 169/112 130/80 (!) 156/117  Pulse:  (!) 113 (!) 117 (!) 115  Resp:    19  Temp: 98.6 F (37 C)   97.8 F (36.6 C)  TempSrc: Oral   Oral  SpO2:  95%  96%  Weight:      Height:       GEN- The patient is elderly appearing, alert and oriented x 3 today.   HEENT: normocephalic, atraumatic; sclera clear, conjunctiva pink; hearing intact; oropharynx clear; neck supple; JVP ~7-8 cm Lungs- Diminished breath sounds. She is SOB with conversation with 5-6 wor dyspnea. No wheezes, rales, rhonchi Heart- Regular rate and rhythm, no murmurs, rubs or gallops GI- soft, non-tender, non-distended, bowel sounds present Extremities- no clubbing, cyanosis, or edema; DP/PT/radial pulses 2+ bilaterally MS- no significant deformity or atrophy Skin- warm and dry, no rash or lesion Psych- euthymic mood, full affect Neuro- strength and sensation are intact  Labs:   Lab Results  Component Value Date   WBC 6.1 11/11/2018   HGB 14.1 11/11/2018   HCT 42.8 11/11/2018   MCV 100.0 11/11/2018   PLT 185 11/11/2018    Recent Labs  Lab 11/10/18 0536  NA 142  K 3.8  CL 104  CO2 26  BUN 28*  CREATININE 1.27*  CALCIUM 8.9  GLUCOSE 94      Radiology/Studies: Dg Chest Port 1 View  Result Date: 11/09/2018 CLINICAL DATA:  Chest pain EXAM: PORTABLE CHEST 1 VIEW COMPARISON:  10/08/2018 FINDINGS: Gross cardiomegaly with left chest multi lead pacer. Aortic atherosclerosis. Right chest port catheter. No acute abnormality of the lungs. Osseous structures are  unremarkable. IMPRESSION: Gross cardiomegaly without acute abnormality of the lungs in AP portable projection. Electronically Signed   By: Eddie Candle M.D.   On: 11/09/2018 11:02    EKG: 11/09/2018 shows V pacing at 112 bpm with likely underlying fib/flutter. (personally reviewed)  TELEMETRY: Current V paced with A fib in the 80-90s, and this prior to her dose of Toprol this am.   DEVICE HISTORY:  STJ leadless PPM implanted 2014 for tachy/brady (early battery depletion); STJ dual chamber PPM implanted 2018  Assessment/Plan: 1.  Permanent Afib/flutter With poor rate control, but seems to be improving.  Diltiazem stopped with worsening cardiomyopathy by cath, Echo pending Continue Toprol XL 200 mg daily. BP has been running 130-150s so good room. She previously had nausea and LFT elevation on amio. ? rechallenge vs consider for AV nodal ablation/CRT vs reprogramming of current PPM if possible.  She is not a good candidate for ablation with age, duration of Afib, and LA size 5.3 c, pm Echo 2019. Repeat pending. QTc appears to run ~440-460 ms when corrected for QRS, so may be able to consider for tikosyn.  CHA2DS2/VASc is 6 on coumadin. Allowed to drift down for cath, but has been covered with heparin.  Will discuss disposition further with Dr. [name].  2. NSTEMI with hx of CAD s/p multiple stents No target lesion on cath 11/10/2018 Continue medical management  3. Acute on chronic combined CHF Echo 10/2017 EF 40-45% -> 20-25% by LV gram 10/7. Formal repeat pending. Volume status looks slightly elevated on exam, received 80 mg IV lasix this am. Her SOB is out of proportion to her volume on exam.   4. SSS s/p PPM Stable device function.  5. CKD III Cr stable ~ 1.1-1.2. Follow.  For questions or updates, please contact [place] Please consult [email or web address] for contact info under Cardiology/STEMI.  Signed, Shirley Friar, PA-C  11/11/2018 9:37 AM   I have seen, examined  the patient, and reviewed the above assessment and plan.  Changes to above are made where necessary.  On exam, tachycardic irregular rhythm.  The patient is well known to me.  She has refractory atrial arrhythmias and has not previously tolerated amiodarone.  She remains quite tachycardic despite aggressive rate control efforts.  I would advise that we try tikosyn.  If she fails medical therapy with tikosyn, we would likely need to consider upgrade to CRT-P with AV nodal ablation.  There appears to be some tracking of high atrial rates.  I will therefore reprogram her PPM VVI for the short term. EP to follow closely with you.    Co Sign: Thompson Grayer, MD

## 2018-11-11 NOTE — Progress Notes (Signed)
Pt set up on CPAP with full face mask this is patients home regimen.  Patient states her setting is 4.0 CMH20.  She is tolerating well advised her to have RN call if she has any issues throughout the night.

## 2018-11-11 NOTE — Progress Notes (Signed)
Called into patients room around 1207 with complaints of feeling dizzy. Patient sitting up on the side of the bed. BP 91/72, HR 79: Patient laid down in bed BP 89/46, HR 80, waited 2 minutes raised patients head BP 90/51, HR 82 . Called and spoke with Dr. Oval Linsey, new orders were given. Patient resting in bed, will continue to monitor.

## 2018-11-11 NOTE — Progress Notes (Signed)
Progress Note  Patient Name: Patty [name] Date of Encounter: 11/11/2018  Primary Cardiologist: Larae Grooms, MD   Subjective   She had a difficult night.  She had chest pressure and heaviness that made it difficult for her to sleep.  She denies palpitations.  Inpatient Medications    Scheduled Meds: . atorvastatin  80 mg Oral Daily  . calcium carbonate  1 tablet Oral QPM   And  . cholecalciferol  800 Units Oral QPM  . Chlorhexidine Gluconate Cloth  6 each Topical Daily  . clopidogrel  75 mg Oral Daily  . furosemide  80 mg Oral Daily  . gabapentin  300 mg Oral BID  . isosorbide mononitrate  60 mg Oral Daily  . metoprolol tartrate  100 mg Oral BID  . multivitamin  1 tablet Oral BID  . multivitamin with minerals  1 tablet Oral Daily  . potassium chloride SA  20 mEq Oral Q supper  . sodium chloride flush  10-40 mL Intracatheter Q12H  . sodium chloride flush  3 mL Intravenous Q12H  . spironolactone  25 mg Oral QODAY  . tamoxifen  10 mg Oral Daily   Continuous Infusions: . sodium chloride 50 mL/hr at 11/11/18 0321  . sodium chloride    . heparin 750 Units/hr (11/11/18 0133)   PRN Meds: sodium chloride, acetaminophen, nitroGLYCERIN, ondansetron (ZOFRAN) IV, sodium chloride flush, sodium chloride flush   Vital Signs    Vitals:   11/11/18 0433 11/11/18 0630 11/11/18 0636 11/11/18 0830  BP:  (!) 169/112 130/80 (!) 156/117  Pulse:  (!) 113 (!) 117 (!) 115  Resp:    19  Temp: 98.6 F (37 C)   97.8 F (36.6 C)  TempSrc: Oral   Oral  SpO2:  95%  96%  Weight:      Height:        Intake/Output Summary (Last 24 hours) at 11/11/2018 0858 Last data filed at 11/11/2018 0600 Gross per 24 hour  Intake 1454.46 ml  Output 401 ml  Net 1053.46 ml   Last 3 Weights 11/09/2018 10/18/2018 10/09/2018  Weight (lbs) 167 lb 8.8 oz 172 lb 3.2 oz 169 lb 8 oz  Weight (kg) 76 kg 78.109 kg 76.885 kg      Telemetry    Atrial fibrillation/flutter.  Ventricular pacing.  Rates poorly  controlled.- Personally Reviewed  ECG    n/a- Personally Reviewed  Physical Exam   VS:  BP (!) 156/117 (BP Location: Right Wrist)   Pulse (!) 115   Temp 97.8 F (36.6 C) (Oral)   Resp 19   Ht 5' (1.524 m)   Wt 76 kg   SpO2 96%   BMI 32.72 kg/m  , BMI Body mass index is 32.72 kg/m. GENERAL:  Well appearing.  No acute distress.  HEENT: Pupils equal round and reactive, fundi not visualized, oral mucosa unremarkable NECK:  No jugular venous distention, waveform within normal limits, carotid upstroke brisk and symmetric, no bruits, no thyromegaly LYMPHATICS:  No cervical adenopathy LUNGS:  Clear to auscultation bilaterally HEART:  Tachycardic.  Irregularly irregular.  PMI not displaced or sustained,S1 and S2 within normal limits, no S3, no S4, no clicks, no rubs, no murmurs ABD:  Flat, positive bowel sounds normal in frequency in pitch, no bruits, no rebound, no guarding, no midline pulsatile mass, no hepatomegaly, no splenomegaly EXT:  2 plus pulses throughout, no edema, no cyanosis no clubbing SKIN:  No rashes no nodules NEURO:  Cranial nerves II through  XII grossly intact, motor grossly intact throughout PSYCH:  Cognitively intact, oriented to person place and time   Labs    High Sensitivity Troponin:   Recent Labs  Lab 11/09/18 1040 11/09/18 1240 11/09/18 1623 11/09/18 1840 11/11/18 0637  TROPONINIHS 50* 262* 906* 1,130* 1,177*      Chemistry Recent Labs  Lab 11/09/18 1040 11/10/18 0536  NA 141 142  K 3.6 3.8  CL 104 104  CO2 28 26  GLUCOSE 98 94  BUN 27* 28*  CREATININE 1.29* 1.27*  CALCIUM 8.9 8.9  GFRNONAA 37* 38*  GFRAA 43* 44*  ANIONGAP 9 12     Hematology Recent Labs  Lab 11/10/18 0536 11/10/18 1930 11/11/18 0209  WBC 5.5 5.6 6.1  RBC 4.06 4.13 4.28  HGB 13.3 13.5 14.1  HCT 41.6 41.6 42.8  MCV 102.5* 100.7* 100.0  MCH 32.8 32.7 32.9  MCHC 32.0 32.5 32.9  RDW 15.9* 16.0* 15.9*  PLT 192 209 185    BNP Recent Labs  Lab 11/09/18 1041   BNP 664.9*     DDimer No results for input(s): DDIMER in the last 168 hours.   Radiology    Dg Chest Port 1 View  Result Date: 11/09/2018 CLINICAL DATA:  Chest pain EXAM: PORTABLE CHEST 1 VIEW COMPARISON:  10/08/2018 FINDINGS: Gross cardiomegaly with left chest multi lead pacer. Aortic atherosclerosis. Right chest port catheter. No acute abnormality of the lungs. Osseous structures are unremarkable. IMPRESSION: Gross cardiomegaly without acute abnormality of the lungs in AP portable projection. Electronically Signed   By: Eddie Candle M.D.   On: 11/09/2018 11:02    Cardiac Studies   LHC 11/10/18:  Ost LAD to Mid LAD lesion is 20% stenosed.  Mid LAD lesion is 30% stenosed.  Ost 2nd Mrg to 2nd Mrg lesion is 60% stenosed.  Ost 1st Mrg to 1st Mrg lesion is 100% stenosed.  Ost RPDA to RPDA lesion is 50% stenosed.  Ost Cx to Prox Cx lesion is 40% stenosed.  Prox Cx to Mid Cx lesion is 20% stenosed.  1st Mrg lesion is 100% stenosed.  Previously placed Dist Cx stent (unknown type) is widely patent.  2nd Mrg lesion is 90% stenosed.  There is severe left ventricular systolic dysfunction.  LV end diastolic pressure is mildly elevated.  The left ventricular ejection fraction is less than 25% by visual estimate.  Ost RCA to Mid RCA lesion is 40% stenosed.  Mid RCA to Dist RCA lesion is 20% stenosed.   1.  Underlying three-vessel coronary artery disease with widely patent stents in the LAD, left circumflex and right coronary arteries.  OM1 is occluded with collaterals.  OM 2 is diffusely diseased and small.  There is moderate restenosis in the ostial left circumflex stent.  Mild to moderate restenosis in the RCA stents.  Ostial left circumflex stents appears to have significant restenosis at the ostium in certain views but not in other views.  I reviewed previous cardiac catheterization and the appearance does not appear significantly different. 2.  Severely reduced LV systolic  function with an EF of 20 to 25% with mildly elevated left ventricular end-diastolic pressure.  Echo 10/14/17: LVEF 40-45%.  Septal-lateral dyssunchrony.  Grade 2 diastolic dysfunction.  Mild AR.  Mild ascending aora aneurysm.  PASP 36 mmHg.    Patient Profile     85 y.o. female with a complex coronary history with her most recently orbital atherectomy and DES of the distal LCX and LAD DES 123XX123, chronic systolic  and diastolic heart failure LVEF 40-45%, paroxysmal atrial fibrillation, SSS s/p PPM, OSA on CPAP, CKD III, obestiy and ovarian cancer admitted with atrial fibrillation with RVR, acute on chronic systolic and diastolic heart failure and NSTEMI.    Assessment & Plan   # Persistent atrial fibrillation:  Last device interrogation showed an increase in her atrial fibrillation burden from 50% to 100% since April 2020.  Heart rate trend has been more elevated.  Diltiazem was discontinued due to poor systolic function.  She is already on metoprolol 100mg  bid.  We will consolidate this to metoprolol succinate 200 mg daily given her systolic dysfunction.  We will ask EP to see and evaluate for dofetilide vs. Amiodarone and assistance with rate control.  Continue heparin and resume warfarin tonight.  # CAD: # Demand ischemia: # Hyperliidemia: Ms. Baucum has 3 vessel CAD and was admitted with chest pain and elevated troponin.  Cath results as above, mostly unchanged from prior cath.  Elevated troponin is thought to be due to acute on chronic heart failure and afib with RVR.  LDL 62 this admission.  Continue clopidogrel, atorvastatin, Imdur, and switch to metoprolol succinate as above.  # Acute on chronic systolic and diastolic heart failure: # Essential hypertension: LVEF reduced from 40-45% previously to 20% this admission.  She has had a much higher atrial fibrillation burden and rates have been poorly-controlled.  Previous echo also noted septal-lateral dyssynchrony.  Start Entresto 24/26 mg  twice daily.  Consolidate to metoprolol succinate 200 mg daily.  Continue Spironolactone.  She has been getting continuous IV fluid since her cath.  We will stop this and give a dose of Lasix 80 mg IV x1.  We have asked EP to see her as above for recommendations on rate/rhythm control.  Consider CRT upgrade, especially if no improvement in LVEF with rate/rhythm control.   For questions or updates, please contact [place] Please consult [email or web address] for contact info under        Signed, Skeet Latch, MD  11/11/2018, 8:58 AM

## 2018-11-12 ENCOUNTER — Inpatient Hospital Stay (HOSPITAL_COMMUNITY): Payer: Medicare Other

## 2018-11-12 DIAGNOSIS — I480 Paroxysmal atrial fibrillation: Secondary | ICD-10-CM

## 2018-11-12 LAB — BASIC METABOLIC PANEL
Anion gap: 11 (ref 5–15)
BUN: 28 mg/dL — ABNORMAL HIGH (ref 8–23)
CO2: 23 mmol/L (ref 22–32)
Calcium: 8.5 mg/dL — ABNORMAL LOW (ref 8.9–10.3)
Chloride: 107 mmol/L (ref 98–111)
Creatinine, Ser: 1.03 mg/dL — ABNORMAL HIGH (ref 0.44–1.00)
GFR calc Af Amer: 57 mL/min — ABNORMAL LOW (ref >60)
GFR calc non Af Amer: 49 mL/min — ABNORMAL LOW (ref >60)
Glucose, Bld: 107 mg/dL — ABNORMAL HIGH (ref 70–99)
Potassium: 4.3 mmol/L (ref 3.5–5.1)
Sodium: 141 mmol/L (ref 135–145)

## 2018-11-12 LAB — ECHOCARDIOGRAM COMPLETE
Height: 60 in
Weight: 2747.81 oz

## 2018-11-12 LAB — PROTIME-INR
INR: 1.6 — ABNORMAL HIGH (ref 0.8–1.2)
Prothrombin Time: 18.9 seconds — ABNORMAL HIGH (ref 11.4–15.2)

## 2018-11-12 LAB — CBC
HCT: 42.6 % (ref 36.0–46.0)
Hemoglobin: 13.7 g/dL (ref 12.0–15.0)
MCH: 32.6 pg (ref 26.0–34.0)
MCHC: 32.2 g/dL (ref 30.0–36.0)
MCV: 101.4 fL — ABNORMAL HIGH (ref 80.0–100.0)
Platelets: 200 10*3/uL (ref 150–400)
RBC: 4.2 MIL/uL (ref 3.87–5.11)
RDW: 15.9 % — ABNORMAL HIGH (ref 11.5–15.5)
WBC: 7 10*3/uL (ref 4.0–10.5)
nRBC: 0 % (ref 0.0–0.2)

## 2018-11-12 LAB — MAGNESIUM: Magnesium: 2 mg/dL (ref 1.7–2.4)

## 2018-11-12 LAB — HEPARIN LEVEL (UNFRACTIONATED): Heparin Unfractionated: 0.58 IU/mL (ref 0.30–0.70)

## 2018-11-12 MED ORDER — WARFARIN SODIUM 3 MG PO TABS
3.0000 mg | ORAL_TABLET | Freq: Once | ORAL | Status: AC
  Administered 2018-11-12: 3 mg via ORAL
  Filled 2018-11-12: qty 1

## 2018-11-12 NOTE — TOC Benefit Eligibility Note (Signed)
Transition of Care [facility]) Benefit Eligibility Note    Patient Details  Name: Patty [name] MRN: [id number] Date of Birth: 12-Oct-1930   Medication/Dose: Dofetilide 500, 250 and 125 mg bid.     Tier: 3 Drug  Prescription Coverage Preferred Pharmacy: Any retail pharmacy  Spoke with Person/Company/Phone Number:: Danae Chen  Co-Pay: 10.00 for a 30 day supply 25 for a 90 day supply  Prior Approval: No  Deductible: Met(No Deductible)  Additional Notes: Tikosyn is not covered  25.00 for 90 day supply [employer] Phone Number: 11/12/2018, 2:40 PM

## 2018-11-12 NOTE — Progress Notes (Signed)
Summary-Pt rec'd PRN NTG SL x 4 this shift, effective. Of note, pt requested RX intervention in anticipation of need for two of the administrations, stating that if she did not receive the dose, she would have pain and get clammy. Communication with the cardiology fellow at [facility] resulted in the holding of the first dose of Tikosyn due to  QTc > 500 after 12 lead EKG obtained

## 2018-11-12 NOTE — Progress Notes (Signed)
  Post tikosyn dose EKG reviewed  Shows pt remains in afib at 109 bpm. QTc auto-calculated as 581, but when measured manually and recalculated for rate and LBBB, measurs ~460-470 ms.   Continue Tikosyn 125 mcg BID at this time.   There was an error processing the EKG (Note no personal information in the upper left corner as usual), so it did not automatically transfer into Epic. It is available for review in patients paper chart and attached below.   Shirley Friar, PA-C  Pager: [phone]  11/12/2018 4:00 PM

## 2018-11-12 NOTE — Progress Notes (Signed)
  Echocardiogram 2D Echocardiogram has been performed.  Gamal Todisco G Mikalah Skyles 11/12/2018, 1:27 PM

## 2018-11-12 NOTE — Progress Notes (Signed)
Call to notify PA Tillery QTc is elevated and medication held.  He will come to assess

## 2018-11-12 NOTE — Progress Notes (Signed)
Progress Note  Patient Name: Patty [name] Date of Encounter: 11/12/2018  Primary Cardiologist: Larae Grooms, MD   Subjective   Increased chest pressure this AM.  Still feeling short of breath.   Inpatient Medications    Scheduled Meds: . atorvastatin  80 mg Oral Daily  . calcium carbonate  1 tablet Oral QPM   And  . cholecalciferol  800 Units Oral QPM  . Chlorhexidine Gluconate Cloth  6 each Topical Daily  . clopidogrel  75 mg Oral Daily  . dofetilide  125 mcg Oral BID  . furosemide  80 mg Oral Daily  . gabapentin  300 mg Oral BID  . isosorbide mononitrate  60 mg Oral Daily  . metoprolol succinate  200 mg Oral Daily  . multivitamin  1 tablet Oral BID  . multivitamin with minerals  1 tablet Oral Daily  . potassium chloride SA  20 mEq Oral Q supper  . sodium chloride flush  10-40 mL Intracatheter Q12H  . sodium chloride flush  3 mL Intravenous Q12H  . sodium chloride flush  3 mL Intravenous Q12H  . spironolactone  25 mg Oral QODAY  . tamoxifen  10 mg Oral Daily  . warfarin  3 mg Oral ONCE-1800  . Warfarin - Pharmacist Dosing Inpatient   Does not apply q1800   Continuous Infusions: . sodium chloride    . sodium chloride    . heparin 900 Units/hr (11/11/18 2231)   PRN Meds: sodium chloride, sodium chloride, acetaminophen, nitroGLYCERIN, ondansetron (ZOFRAN) IV, sodium chloride flush, sodium chloride flush, sodium chloride flush   Vital Signs    Vitals:   11/11/18 1626 11/11/18 2051 11/11/18 2351 11/12/18 0429  BP:  132/75 (!) 156/94 115/83  Pulse:  (!) 112    Resp:      Temp:  (!) 97.5 F (36.4 C) 98.6 F (37 C) 98.6 F (37 C)  TempSrc:  Oral Oral   SpO2:      Weight: 77.7 kg   77.9 kg  Height: 5' (1.524 m)       Intake/Output Summary (Last 24 hours) at 11/12/2018 1000 Last data filed at 11/12/2018 0300 Gross per 24 hour  Intake 623.51 ml  Output 1600 ml  Net -976.49 ml   Last 3 Weights 11/12/2018 11/11/2018 11/09/2018  Weight (lbs) 171 lb 11.8  oz 171 lb 4.8 oz 167 lb 8.8 oz  Weight (kg) 77.9 kg 77.7 kg 76 kg      Telemetry    Atrial fibrillation/flutter.  Ventricular pacing.  Rates poorly controlled.- Personally Reviewed  ECG    n/a- Personally Reviewed  Physical Exam   VS:  BP 115/83 (BP Location: Right Arm)   Pulse (!) 112   Temp 98.6 F (37 C)   Resp 19   Ht 5' (1.524 m)   Wt 77.9 kg   SpO2 96%   BMI 33.54 kg/m  , BMI Body mass index is 33.54 kg/m. GENERAL:  Well appearing.  No acute distress.  HEENT: Pupils equal round and reactive, fundi not visualized, oral mucosa unremarkable NECK:  No jugular venous distention, waveform within normal limits, carotid upstroke brisk and symmetric, no bruits, no thyromegaly LYMPHATICS:  No cervical adenopathy LUNGS:  Clear to auscultation bilaterally HEART:  Tachycardic.  Irregularly irregular.  PMI not displaced or sustained,S1 and S2 within normal limits, no S3, no S4, no clicks, no rubs, no murmurs ABD:  Flat, positive bowel sounds normal in frequency in pitch, no bruits, no rebound, no guarding,  no midline pulsatile mass, no hepatomegaly, no splenomegaly EXT:  2 plus pulses throughout, no edema, no cyanosis no clubbing SKIN:  No rashes no nodules NEURO:  Cranial nerves II through XII grossly intact, motor grossly intact throughout PSYCH:  Cognitively intact, oriented to person place and time   Labs    High Sensitivity Troponin:   Recent Labs  Lab 11/09/18 1240 11/09/18 1623 11/09/18 1840 11/11/18 0637 11/11/18 0819  TROPONINIHS 262* 906* 1,130* 1,177* 976*      Chemistry Recent Labs  Lab 11/11/18 1630 11/11/18 2040 11/12/18 0515  NA 138 139 141  K 3.7 4.1 4.3  CL 105 106 107  CO2 26 26 23   GLUCOSE 98 97 107*  BUN 28* 30* 28*  CREATININE 1.35* 1.34* 1.03*  CALCIUM 8.3* 8.4* 8.5*  GFRNONAA 35* 36* 49*  GFRAA 41* 41* 57*  ANIONGAP 7 7 11      Hematology Recent Labs  Lab 11/10/18 1930 11/11/18 0209 11/12/18 0515  WBC 5.6 6.1 7.0  RBC 4.13  4.28 4.20  HGB 13.5 14.1 13.7  HCT 41.6 42.8 42.6  MCV 100.7* 100.0 101.4*  MCH 32.7 32.9 32.6  MCHC 32.5 32.9 32.2  RDW 16.0* 15.9* 15.9*  PLT 209 185 200    BNP Recent Labs  Lab 11/09/18 1041  BNP 664.9*     DDimer No results for input(s): DDIMER in the last 168 hours.   Radiology    No results found.  Cardiac Studies   LHC 11/10/18:  Ost LAD to Mid LAD lesion is 20% stenosed.  Mid LAD lesion is 30% stenosed.  Ost 2nd Mrg to 2nd Mrg lesion is 60% stenosed.  Ost 1st Mrg to 1st Mrg lesion is 100% stenosed.  Ost RPDA to RPDA lesion is 50% stenosed.  Ost Cx to Prox Cx lesion is 40% stenosed.  Prox Cx to Mid Cx lesion is 20% stenosed.  1st Mrg lesion is 100% stenosed.  Previously placed Dist Cx stent (unknown type) is widely patent.  2nd Mrg lesion is 90% stenosed.  There is severe left ventricular systolic dysfunction.  LV end diastolic pressure is mildly elevated.  The left ventricular ejection fraction is less than 25% by visual estimate.  Ost RCA to Mid RCA lesion is 40% stenosed.  Mid RCA to Dist RCA lesion is 20% stenosed.   1.  Underlying three-vessel coronary artery disease with widely patent stents in the LAD, left circumflex and right coronary arteries.  OM1 is occluded with collaterals.  OM 2 is diffusely diseased and small.  There is moderate restenosis in the ostial left circumflex stent.  Mild to moderate restenosis in the RCA stents.  Ostial left circumflex stents appears to have significant restenosis at the ostium in certain views but not in other views.  I reviewed previous cardiac catheterization and the appearance does not appear significantly different. 2.  Severely reduced LV systolic function with an EF of 20 to 25% with mildly elevated left ventricular end-diastolic pressure.  Echo 10/14/17: LVEF 40-45%.  Septal-lateral dyssunchrony.  Grade 2 diastolic dysfunction.  Mild AR.  Mild ascending aora aneurysm.  PASP 36 mmHg.    Patient  Profile     85 y.o. female with a complex coronary history with her most recently orbital atherectomy and DES of the distal LCX and LAD DES 123XX123, chronic systolic and diastolic heart failure LVEF 40-45%, paroxysmal atrial fibrillation, SSS s/p PPM, OSA on CPAP, CKD III, obestiy and ovarian cancer admitted with atrial fibrillation with RVR, acute  on chronic systolic and diastolic heart failure and NSTEMI.    Assessment & Plan   # Persistent atrial fibrillation:  Last device interrogation showed an increase in her atrial fibrillation burden from 50% to 100% since April 2020.  Heart rate trend has been more elevated.  Diltiazem was discontinued due to poor systolic function.  She is already on metoprolol 100mg  bid. This was consolidated to metoprolol succinate 200 mg daily given her systolic dysfunction.  Appreciate EP consult.  She is now starting dofetilide.  Continue heparin/warfarin.  # CAD: # Demand ischemia: # Hyperliidemia: Ms. Raap has 3 vessel CAD and was admitted with chest pain and elevated troponin.  Cath results as above, mostly unchanged from prior cath.  Elevated troponin is thought to be due to demand ischemia in the setting of acute on chronic heart failure and afib with RVR.  LDL 62 this admission.  Continue clopidogrel, atorvastatin, Imdur, and switched to metoprolol succinate as above.  # Acute on chronic systolic and diastolic heart failure: # Essential hypertension: LVEF reduced from 40-45% previously to 20% this admission.  She has had a much higher atrial fibrillation burden and rates have been poorly-controlled.  Previous echo also noted septal-lateral dyssynchrony.  Start Entresto 24/26 mg twice daily on 10/8 but she became hypotensive and dizzy.  Consider retrial or low dose losartan once she is back in sinus rhythm.  Consolidated metoprolol succinate 200 mg daily.  Continue Spironolactone and lasix. Consider CRT upgrade, especially if no improvement in LVEF with  rate/rhythm control.   For questions or updates, please contact [place] Please consult [email or web address] for contact info under        Signed, Skeet Latch, MD  11/12/2018, 10:00 AM

## 2018-11-12 NOTE — Progress Notes (Addendum)
Electrophysiology Rounding Note  Patient Name: Patty [name] Date of Encounter: 11/12/2018  Primary Cardiologist: Larae Grooms, MD Electrophysiologist: Thompson Grayer, MD   Subjective   Feeling OK this am. She had more chest pain yesterday evening, but this is relieved with NTG, and interestingly "much improved" while she is on her CPAP.   Unfortunately, Tikosyn was not administered last night.   Inpatient Medications    Scheduled Meds: . atorvastatin  80 mg Oral Daily  . calcium carbonate  1 tablet Oral QPM   And  . cholecalciferol  800 Units Oral QPM  . Chlorhexidine Gluconate Cloth  6 each Topical Daily  . clopidogrel  75 mg Oral Daily  . dofetilide  125 mcg Oral BID  . furosemide  80 mg Oral Daily  . gabapentin  300 mg Oral BID  . isosorbide mononitrate  60 mg Oral Daily  . metoprolol succinate  200 mg Oral Daily  . multivitamin  1 tablet Oral BID  . multivitamin with minerals  1 tablet Oral Daily  . potassium chloride SA  20 mEq Oral Q supper  . sodium chloride flush  10-40 mL Intracatheter Q12H  . sodium chloride flush  3 mL Intravenous Q12H  . sodium chloride flush  3 mL Intravenous Q12H  . spironolactone  25 mg Oral QODAY  . tamoxifen  10 mg Oral Daily  . warfarin  3 mg Oral ONCE-1800  . Warfarin - Pharmacist Dosing Inpatient   Does not apply q1800   Continuous Infusions: . sodium chloride    . sodium chloride    . heparin 900 Units/hr (11/11/18 2231)   PRN Meds: sodium chloride, sodium chloride, acetaminophen, nitroGLYCERIN, ondansetron (ZOFRAN) IV, sodium chloride flush, sodium chloride flush, sodium chloride flush   Vital Signs    Vitals:   11/11/18 1626 11/11/18 2051 11/11/18 2351 11/12/18 0429  BP:  132/75 (!) 156/94 115/83  Pulse:  (!) 112    Resp:      Temp:  (!) 97.5 F (36.4 C) 98.6 F (37 C) 98.6 F (37 C)  TempSrc:  Oral Oral   SpO2:      Weight: 77.7 kg   77.9 kg  Height: 5' (1.524 m)       Intake/Output Summary (Last 24  hours) at 11/12/2018 0745 Last data filed at 11/12/2018 0300 Gross per 24 hour  Intake 886.01 ml  Output 1600 ml  Net -713.99 ml   Filed Weights   11/09/18 1800 11/11/18 1626 11/12/18 0429  Weight: 76 kg 77.7 kg 77.9 kg   Physical Exam    GEN- The patient is well appearing, alert and oriented x 3 today.   Head- normocephalic, atraumatic Eyes-  Sclera clear, conjunctiva pink Ears- hearing intact Oropharynx- clear Neck- supple Lungs- Clear to ausculation bilaterally, normal work of breathing Heart- Regular rate and rhythm, no murmurs, rubs or gallops GI- soft, NT, ND, + BS Extremities- no clubbing, cyanosis, or edema Skin- no rash or lesion Psych- euthymic mood, full affect Neuro- strength and sensation are intact  Labs    CBC Recent Labs    11/09/18 1040  11/11/18 0209 11/12/18 0515  WBC 5.5   < > 6.1 7.0  NEUTROABS 4.0  --   --   --   HGB 13.7   < > 14.1 13.7  HCT 41.3   < > 42.8 42.6  MCV 100.7*   < > 100.0 101.4*  PLT 186   < > 185 200   < > =  values in this interval not displayed.   Basic Metabolic Panel Recent Labs    11/11/18 1630 11/11/18 2040 11/12/18 0515  NA 138 139 141  K 3.7 4.1 4.3  CL 105 106 107  CO2 26 26 23   GLUCOSE 98 97 107*  BUN 28* 30* 28*  CREATININE 1.35* 1.34* 1.03*  CALCIUM 8.3* 8.4* 8.5*  MG 2.1  --  2.0   Liver Function Tests No results for input(s): AST, ALT, ALKPHOS, BILITOT, PROT, ALBUMIN in the last 72 hours. No results for input(s): LIPASE, AMYLASE in the last 72 hours. Cardiac Enzymes No results for input(s): CKTOTAL, CKMB, CKMBINDEX, TROPONINI in the last 72 hours.   Telemetry    Afib in 110-120s, at times her rate slows down to 80-90s (personally reviewed)  Radiology    No results found.  Patient Profile     Patty [name] is a 85 y.o. female with a history of CAD s/p multiple stenting, permanent atrial fibrillation, HTN, OSA, and symptomatic sinus brady s/p [employer] dual PPM 2018  who is being seen today for  the evaluation of afib with poor rate control at the request of Dr. Oval Linsey.  Assessment & Plan    1. Persistent Afib/flutter Prior to April of this year she had periods of sinus, but since has been in Afib 80-100% of the time.   She has been anticoagulated on coumadin. Held for cath, but covered with heparin.  Continue Toprol XL 200 mg daily Start tikosyn as planned at 125 mcg BID. Consider DCCV this weekend. If fails tikosyn, will ultimately require CRT upgrade and AV nodal ablation early next week.   2. NSTEM with hx of CAD s/p multiple stents No target lesion cath 11/10/2018 She continues to have intermittent chest pain; likely in the setting of demand ischemia Continue medical management  3. SSS s/p PPM Programmed to VVI this am  4. Acute on chronic combined CHF EF down to 25% on LV gram. Repeat pending.  Volume status looks OK back on po diuretic.    For questions or updates, please contact [place] Please consult [email or web address] for contact info under Cardiology/STEMI.  Signed, Shirley Friar, PA-C  11/12/2018, 7:45 AM     I have seen, examined the patient, and reviewed the above assessment and plan.  Changes to above are made where necessary.  On exam, tachycardic irregular rhythm.  We will initiate tikosyn.  Plan tikosyn load over the weekend.  She could be cardioverted on Sunday if she does not convert with loading.  If she fails tikosyn, I would advise AV nodal ablation and CRT-P upgrade on Monday.  Dr Caryl Comes to follow over the weekend.  Co Sign: Thompson Grayer, MD 11/12/2018 9:41 PM

## 2018-11-12 NOTE — Progress Notes (Signed)
[place] for Heparin Indication: chest pain/ACS  Allergies  Allergen Reactions  . Bee Venom Anaphylaxis  . Other Nausea And Vomiting and Other (See Comments)    Pain medications cause severe vomiting. Tolerated slow IV morphine drip  . Amiodarone Nausea Only  . Prednisone Palpitations and Other (See Comments)    "Rapid Heart Beat"    Patient Measurements: Height: 5' (152.4 cm) Weight: 171 lb 11.8 oz (77.9 kg) IBW/kg (Calculated) : 45.5 Heparin Dosing Weight: 63 kg  Vital Signs: Temp: 98.6 F (37 C) (10/09 0429) Temp Source: Oral (10/08 2351) BP: 115/83 (10/09 0429) Pulse Rate: 112 (10/08 2051)  Labs: Recent Labs    11/09/18 1840  11/10/18 1930 11/11/18 0209 11/11/18 [id number] 11/11/18 0819 11/11/18 0930 11/11/18 1630 11/11/18 2040 11/12/18 0515 11/12/18 0521  HGB  --    < > 13.5 14.1  --   --   --   --   --  13.7  --   HCT  --    < > 41.6 42.8  --   --   --   --   --  42.6  --   PLT  --    < > 209 185  --   --   --   --   --  200  --   LABPROT  --    < > 20.9* 19.3*  --   --   --   --   --  18.9*  --   INR  --    < > 1.8* 1.7*  --   --   --   --   --  1.6*  --   HEPARINUNFRC  --   --   --   --   --   --  0.26*  --  0.53  --  0.58  CREATININE  --    < >  --   --   --   --   --  1.35* 1.34* 1.03*  --   TROPONINIHS 1,130*  --   --   --  1,177* 976*  --   --   --   --   --    < > = values in this interval not displayed.    Estimated Creatinine Clearance: 36.2 mL/min (A) (by C-G formula based on SCr of 1.03 mg/dL (H)).   Assessment: Patient is a 85 year old female who presents to the ED for evaluation of chest pain. She is noted with NSTEMI s/p cath on heparin (on warfarin PTA for afib).   Heparin level 0.58 on 900 units/hr - therapeutic.  INR trending down to 1.6 this morning, warfarin resumed yesterday. No bleeding or issues with line reported, cbc stable.  Will watch INR closely on tamoxifen.   Goal of Therapy:  INR goal  2-3 Heparin level 0.3-0.7 units/ml Monitor platelets by anticoagulation protocol: Yes   Plan:  Warfarin 3mg  tonight Continue IV heparin at 900 units/hr Follow-up daily heparin level and CBC  Erin Hearing PharmD., BCPS Clinical Pharmacist 11/12/2018 7:31 AM  c

## 2018-11-12 NOTE — Progress Notes (Signed)
Personally reviewed EKG and discussed with Dr. [name].  When corrected for LBBB and rate, QTc is stable for administration of Tikosyn this am.    Will follow up EKG 2-3 hours s/p dosing to continue to follow QTc.   [address], [place]  Pager: [phone]  11/12/2018 9:34 AM

## 2018-11-12 NOTE — Plan of Care (Signed)
  Problem: Activity: Goal: Ability to return to baseline activity level will improve Outcome: Progressing   Problem: Cardiovascular: Goal: Ability to achieve and maintain adequate cardiovascular perfusion will improve Outcome: Progressing   Problem: Education: Goal: Understanding of CV disease, CV risk reduction, and recovery process will improve Outcome: Progressing

## 2018-11-12 NOTE — Progress Notes (Signed)
Pharmacy: Dofetilide (Tikosyn) - Follow Up Assessment and Electrolyte Replacement  Pharmacy consulted to assist in monitoring and replacing electrolytes in this 85 y.o. female admitted on 11/09/2018 undergoing dofetilide initiation. First dofetilide dose: 125 mcg on 10/8 PM.   Labs:    Component Value Date/Time   K 4.3 11/12/2018 0515   K 4.2 12/14/2014 1324   MG 2.0 11/12/2018 0515     Plan: Potassium: K >/= 4: No additional supplementation needed  Magnesium: Mg > 2: No additional supplementation needed   Thank you for allowing pharmacy to participate in this patient's care   Erin Hearing PharmD., BCPS Clinical Pharmacist 11/12/2018 7:37 AM

## 2018-11-12 NOTE — Progress Notes (Signed)
Pt decides she wants to wear CPAP RN calls for me to place on patient.  Patient now wearing and tolerating well.

## 2018-11-13 DIAGNOSIS — I484 Atypical atrial flutter: Secondary | ICD-10-CM

## 2018-11-13 LAB — BASIC METABOLIC PANEL
Anion gap: 8 (ref 5–15)
Anion gap: 9 (ref 5–15)
BUN: 27 mg/dL — ABNORMAL HIGH (ref 8–23)
BUN: 25 mg/dL — ABNORMAL HIGH (ref 8–23)
CO2: 26 mmol/L (ref 22–32)
CO2: 26 mmol/L (ref 22–32)
Calcium: 8.8 mg/dL — ABNORMAL LOW (ref 8.9–10.3)
Calcium: 8.8 mg/dL — ABNORMAL LOW (ref 8.9–10.3)
Chloride: 106 mmol/L (ref 98–111)
Chloride: 106 mmol/L (ref 98–111)
Creatinine, Ser: 1.22 mg/dL — ABNORMAL HIGH (ref 0.44–1.00)
Creatinine, Ser: 1.23 mg/dL — ABNORMAL HIGH (ref 0.44–1.00)
GFR calc Af Amer: 46 mL/min — ABNORMAL LOW (ref >60)
GFR calc Af Amer: 46 mL/min — ABNORMAL LOW (ref >60)
GFR calc non Af Amer: 40 mL/min — ABNORMAL LOW (ref >60)
GFR calc non Af Amer: 40 mL/min — ABNORMAL LOW (ref >60)
Glucose, Bld: 94 mg/dL (ref 70–99)
Glucose, Bld: 134 mg/dL — ABNORMAL HIGH (ref 70–99)
Potassium: 4.2 mmol/L (ref 3.5–5.1)
Potassium: 3.9 mmol/L (ref 3.5–5.1)
Sodium: 140 mmol/L (ref 135–145)
Sodium: 141 mmol/L (ref 135–145)

## 2018-11-13 LAB — PROTIME-INR
INR: 1.8 — ABNORMAL HIGH (ref 0.8–1.2)
INR: 2.2 — ABNORMAL HIGH (ref 0.8–1.2)
Prothrombin Time: 20.8 seconds — ABNORMAL HIGH (ref 11.4–15.2)
Prothrombin Time: 23.7 seconds — ABNORMAL HIGH (ref 11.4–15.2)

## 2018-11-13 LAB — CBC
HCT: 41.8 % (ref 36.0–46.0)
Hemoglobin: 14 g/dL (ref 12.0–15.0)
MCH: 33.8 pg (ref 26.0–34.0)
MCHC: 33.5 g/dL (ref 30.0–36.0)
MCV: 101 fL — ABNORMAL HIGH (ref 80.0–100.0)
Platelets: 205 10*3/uL (ref 150–400)
RBC: 4.14 MIL/uL (ref 3.87–5.11)
RDW: 16 % — ABNORMAL HIGH (ref 11.5–15.5)
WBC: 6.2 10*3/uL (ref 4.0–10.5)
nRBC: 0 % (ref 0.0–0.2)

## 2018-11-13 LAB — HEPARIN LEVEL (UNFRACTIONATED): Heparin Unfractionated: 0.49 IU/mL (ref 0.30–0.70)

## 2018-11-13 LAB — MAGNESIUM: Magnesium: 2.1 mg/dL (ref 1.7–2.4)

## 2018-11-13 MED ORDER — SODIUM CHLORIDE 0.9% FLUSH: 3.0000 mL | INTRAVENOUS | Status: DC | PRN

## 2018-11-13 MED ORDER — POTASSIUM CHLORIDE CRYS ER 20 MEQ PO TBCR
20.0000 meq | EXTENDED_RELEASE_TABLET | Freq: Every day | ORAL | Status: DC
  Administered 2018-11-14 – 2018-11-15 (×2): 20 meq via ORAL
  Filled 2018-11-13 (×2): qty 1

## 2018-11-13 MED ORDER — WARFARIN SODIUM 3 MG PO TABS
3.0000 mg | ORAL_TABLET | Freq: Once | ORAL | Status: AC
  Administered 2018-11-13: 17:00:00 3 mg via ORAL
  Filled 2018-11-13: qty 1

## 2018-11-13 MED ORDER — SODIUM CHLORIDE 0.9 % IV SOLN
250.0000 mL | INTRAVENOUS | Status: DC
  Administered 2018-11-13: 250 mL via INTRAVENOUS

## 2018-11-13 MED ORDER — SODIUM CHLORIDE 0.9% FLUSH
3.0000 mL | Freq: Two times a day (BID) | INTRAVENOUS | Status: DC
  Administered 2018-11-13: 3 mL via INTRAVENOUS

## 2018-11-13 MED ORDER — POTASSIUM CHLORIDE CRYS ER 20 MEQ PO TBCR
40.0000 meq | EXTENDED_RELEASE_TABLET | Freq: Once | ORAL | Status: AC
  Administered 2018-11-13: 17:00:00 40 meq via ORAL
  Filled 2018-11-13: qty 2

## 2018-11-13 NOTE — Progress Notes (Signed)
[place] for Heparin Indication: chest pain/ACS  Allergies  Allergen Reactions  . Bee Venom Anaphylaxis  . Other Nausea And Vomiting and Other (See Comments)    Pain medications cause severe vomiting. Tolerated slow IV morphine drip  . Amiodarone Nausea Only  . Prednisone Palpitations and Other (See Comments)    "Rapid Heart Beat"    Patient Measurements: Height: 5' (152.4 cm) Weight: 171 lb 11.8 oz (77.9 kg) IBW/kg (Calculated) : 45.5 Heparin Dosing Weight: 63 kg  Vital Signs: Temp: 98 F (36.7 C) (10/10 0800) Temp Source: Oral (10/10 0800) BP: 106/73 (10/10 0800) Pulse Rate: 118 (10/10 0800)  Labs: Recent Labs    11/11/18 0209 11/11/18 0637 11/11/18 0819  11/11/18 2040 11/12/18 0515 11/12/18 0521 11/13/18 0456  HGB 14.1  --   --   --   --  13.7  --  14.0  HCT 42.8  --   --   --   --  42.6  --  41.8  PLT 185  --   --   --   --  200  --  205  LABPROT 19.3*  --   --   --   --  18.9*  --  20.8*  INR 1.7*  --   --   --   --  1.6*  --  1.8*  HEPARINUNFRC  --   --   --    < > 0.53  --  0.58 0.49  CREATININE  --   --   --    < > 1.34* 1.03*  --  1.22*  TROPONINIHS  --  1,177* 976*  --   --   --   --   --    < > = values in this interval not displayed.    Estimated Creatinine Clearance: 30.6 mL/min (A) (by C-G formula based on SCr of 1.22 mg/dL (H)).   Assessment: Patient is a 85 year old female who presents to the ED for evaluation of chest pain. She is noted with NSTEMI s/p cath on heparin (on warfarin PTA for afib).   Heparin level 0.49 on 900 units/hr - therapeutic.  INR trending up to 1.8 this morning, warfarin resumed 10/8. No bleeding or issues with line reported, cbc stable.  Will watch INR closely on tamoxifen.   Goal of Therapy:  INR goal 2-3 Heparin level 0.3-0.7 units/ml Monitor platelets by anticoagulation protocol: Yes   Plan:  Warfarin 3mg  again tonight Continue IV heparin at 900 units/hr Follow-up  daily heparin level and CBC  Erin Hearing PharmD., BCPS Clinical Pharmacist 11/13/2018 8:13 AM  c

## 2018-11-13 NOTE — Progress Notes (Signed)
Electrophysiology Rounding Note  Patient Name: Patty [name] Date of Encounter: 11/13/2018  Primary Cardiologist: Patty Grooms, MD Electrophysiologist: Patty Grayer, MD   Subjective    She had more chest pain yesterday evening, but this is relieved with NTG,   Feeling ok  Without sob no edema   Inpatient Medications    Scheduled Meds: . atorvastatin  80 mg Oral Daily  . calcium carbonate  1 tablet Oral QPM   And  . cholecalciferol  800 Units Oral QPM  . Chlorhexidine Gluconate Cloth  6 each Topical Daily  . clopidogrel  75 mg Oral Daily  . dofetilide  125 mcg Oral BID  . furosemide  80 mg Oral Daily  . gabapentin  300 mg Oral BID  . isosorbide mononitrate  60 mg Oral Daily  . metoprolol succinate  200 mg Oral Daily  . multivitamin  1 tablet Oral BID  . multivitamin with minerals  1 tablet Oral Daily  . potassium chloride SA  20 mEq Oral Q supper  . sodium chloride flush  10-40 mL Intracatheter Q12H  . sodium chloride flush  3 mL Intravenous Q12H  . sodium chloride flush  3 mL Intravenous Q12H  . spironolactone  25 mg Oral QODAY  . tamoxifen  10 mg Oral Daily  . warfarin  3 mg Oral ONCE-1800  . Warfarin - Pharmacist Dosing Inpatient   Does not apply q1800   Continuous Infusions: . sodium chloride    . sodium chloride    . heparin 900 Units/hr (11/13/18 0400)   PRN Meds: sodium chloride, sodium chloride, acetaminophen, nitroGLYCERIN, ondansetron (ZOFRAN) IV, sodium chloride flush, sodium chloride flush, sodium chloride flush   Vital Signs    Vitals:   11/13/18 0000 11/13/18 0324 11/13/18 0400 11/13/18 0800  BP:  126/73  106/73  Pulse: (!) 103 (!) 109 (!) 113 (!) 118  Resp: 16 14 16  (!) 24  Temp:  (!) 97.2 F (36.2 C)  98 F (36.7 C)  TempSrc:  Axillary  Oral  SpO2: 96% 97% 98% 98%  Weight:      Height:        Intake/Output Summary (Last 24 hours) at 11/13/2018 0932 Last data filed at 11/13/2018 0912 Gross per 24 hour  Intake 724.06 ml   Output -  Net 724.06 ml   Filed Weights   11/09/18 1800 11/11/18 1626 11/12/18 0429  Weight: 76 kg 77.7 kg 77.9 kg   Physical Exam    GEN-BP 106/73 (BP Location: Left Arm)   Pulse (!) 118   Temp 98 F (36.7 C) (Oral)   Resp (!) 24   Ht 5' (1.524 m)   Wt 77.9 kg   SpO2 98%   BMI 33.54 kg/m  Well developed and nourished in no acute distress HENT normal Neck supple with JVP  Clear Rapid regular rate and rhythm, no murmurs or gallops Abd-soft with active BS No Clubbing cyanosis edema Skin-warm and dry A & Oriented  Grossly normal sensory and motor function   ECG  Personally reviewed  Aflutter 2:1 conduction  Labs    CBC Recent Labs    11/12/18 0515 11/13/18 0456  WBC 7.0 6.2  HGB 13.7 14.0  HCT 42.6 41.8  MCV 101.4* 101.0*  PLT 200 [id number]   Basic Metabolic Panel Recent Labs    11/12/18 0515 11/13/18 0456  NA 141 140  K 4.3 4.2  CL 107 106  CO2 23 26  GLUCOSE 107* 94  BUN 28* 27*  CREATININE 1.03* 1.22*  CALCIUM 8.5* 8.8*  MG 2.0 2.1   Liver Function Tests No results for input(s): AST, ALT, ALKPHOS, BILITOT, PROT, ALBUMIN in the last 72 hours. No results for input(s): LIPASE, AMYLASE in the last 72 hours. Cardiac Enzymes No results for input(s): CKTOTAL, CKMB, CKMBINDEX, TROPONINI in the last 72 hours.   Telemetry    Afib in 110-120s, at times her rate slows down to 80-90s (personally reviewed)  Radiology    No results found.  Patient Profile     Patty [name] is a 85 y.o. female with a history of CAD s/p multiple stenting, permanent atrial fibrillation, HTN, OSA, and symptomatic sinus brady s/p [employer] dual PPM 2018  who is being seen today for the evaluation of afib with poor rate control at the request of Dr. Oval Linsey.  Assessment & Plan    .   AFlutter 2:1  Dofetilide   PM  Cardiomyopathy  Ischemic and NonIschemic  CHF A/C systolic  Renal Insufficiency Gd 3    Continue dofetilide  QT about 550 But QRS long and rate high  will reassess post cardioversion   Volume status is euvolemic For questions or updates, please contact [facility] Please consult [email or web address] for contact info under Cardiology/STEMI.  Signed,  : Patty Axe, MD 11/13/2018 9:32 AM

## 2018-11-13 NOTE — Plan of Care (Signed)
  Problem: Education: Goal: Understanding of CV disease, CV risk reduction, and recovery process will improve Outcome: Progressing   Problem: Activity: Goal: Ability to return to baseline activity level will improve Outcome: Progressing   Problem: Cardiovascular: Goal: Ability to achieve and maintain adequate cardiovascular perfusion will improve Outcome: Progressing   

## 2018-11-13 NOTE — Progress Notes (Signed)
Pt's QTc this am is 541 ms, confirmed with 12 lead EKG, Morning Tikosyn hold, MD is paged to informed  [place]

## 2018-11-13 NOTE — Progress Notes (Signed)
Pharmacy: Dofetilide (Tikosyn) - Follow Up Assessment and Electrolyte Replacement  Pharmacy consulted to assist in monitoring and replacing electrolytes in this 85 y.o. female admitted on 11/09/2018 undergoing dofetilide initiation. First dofetilide dose: 125 mcg on 10/8 PM.   Labs:    Component Value Date/Time   K 4.2 11/13/2018 0456   K 4.2 12/14/2014 1324   MG 2.1 11/13/2018 0456     Plan: Potassium: K >/= 4: No additional supplementation needed  Magnesium: Mg > 2: No additional supplementation needed   Thank you for allowing pharmacy to participate in this patient's care   Erin Hearing PharmD., BCPS Clinical Pharmacist 11/13/2018 8:12 AM

## 2018-11-13 NOTE — Plan of Care (Signed)
  Problem: Education: Goal: Understanding of CV disease, CV risk reduction, and recovery process will improve Outcome: Progressing Goal: Individualized Educational Video(s) Outcome: Progressing   Problem: Activity: Goal: Ability to return to baseline activity level will improve Outcome: Progressing   Problem: Cardiovascular: Goal: Ability to achieve and maintain adequate cardiovascular perfusion will improve Outcome: Progressing Goal: Vascular access site(s) Level 0-1 will be maintained Outcome: Progressing   Problem: Health Behavior/Discharge Planning: Goal: Ability to safely manage health-related needs after discharge will improve Outcome: Progressing   Problem: Education: Goal: Knowledge of disease or condition will improve Outcome: Progressing Goal: Understanding of medication regimen will improve Outcome: Progressing Goal: Individualized Educational Video(s) Outcome: Progressing   Problem: Activity: Goal: Ability to tolerate increased activity will improve Outcome: Progressing   Problem: Cardiac: Goal: Ability to achieve and maintain adequate cardiopulmonary perfusion will improve Outcome: Progressing   Problem: Health Behavior/Discharge Planning: Goal: Ability to safely manage health-related needs after discharge will improve Outcome: Progressing

## 2018-11-14 ENCOUNTER — Other Ambulatory Visit: Payer: Self-pay

## 2018-11-14 ENCOUNTER — Encounter (HOSPITAL_COMMUNITY): Payer: Self-pay | Admitting: Certified Registered Nurse Anesthetist

## 2018-11-14 ENCOUNTER — Encounter (HOSPITAL_COMMUNITY): Payer: Self-pay | Admitting: Anesthesiology

## 2018-11-14 DIAGNOSIS — I4892 Unspecified atrial flutter: Secondary | ICD-10-CM

## 2018-11-14 LAB — BASIC METABOLIC PANEL
Anion gap: 9 (ref 5–15)
BUN: 26 mg/dL — ABNORMAL HIGH (ref 8–23)
CO2: 25 mmol/L (ref 22–32)
Calcium: 8.7 mg/dL — ABNORMAL LOW (ref 8.9–10.3)
Chloride: 104 mmol/L (ref 98–111)
Creatinine, Ser: 1.32 mg/dL — ABNORMAL HIGH (ref 0.44–1.00)
GFR calc Af Amer: 42 mL/min — ABNORMAL LOW (ref >60)
GFR calc non Af Amer: 36 mL/min — ABNORMAL LOW (ref >60)
Glucose, Bld: 92 mg/dL (ref 70–99)
Potassium: 4.4 mmol/L (ref 3.5–5.1)
Sodium: 138 mmol/L (ref 135–145)

## 2018-11-14 LAB — CBC
HCT: 42.2 % (ref 36.0–46.0)
Hemoglobin: 13.3 g/dL (ref 12.0–15.0)
MCH: 32.3 pg (ref 26.0–34.0)
MCHC: 31.5 g/dL (ref 30.0–36.0)
MCV: 102.4 fL — ABNORMAL HIGH (ref 80.0–100.0)
Platelets: 212 10*3/uL (ref 150–400)
RBC: 4.12 MIL/uL (ref 3.87–5.11)
RDW: 16.3 % — ABNORMAL HIGH (ref 11.5–15.5)
WBC: 6 10*3/uL (ref 4.0–10.5)
nRBC: 0 % (ref 0.0–0.2)

## 2018-11-14 LAB — MAGNESIUM: Magnesium: 2.2 mg/dL (ref 1.7–2.4)

## 2018-11-14 LAB — PROTIME-INR
INR: 2.1 — ABNORMAL HIGH (ref 0.8–1.2)
Prothrombin Time: 23.6 seconds — ABNORMAL HIGH (ref 11.4–15.2)

## 2018-11-14 MED ORDER — WARFARIN SODIUM 4 MG PO TABS
4.0000 mg | ORAL_TABLET | Freq: Once | ORAL | Status: AC
  Administered 2018-11-14: 17:00:00 4 mg via ORAL
  Filled 2018-11-14: qty 1

## 2018-11-14 MED ORDER — SODIUM CHLORIDE 0.9% FLUSH: 3.0000 mL | INTRAVENOUS | Status: DC | PRN

## 2018-11-14 MED ORDER — SODIUM CHLORIDE 0.9% FLUSH
3.0000 mL | Freq: Two times a day (BID) | INTRAVENOUS | Status: DC
  Administered 2018-11-14 – 2018-11-15 (×3): 3 mL via INTRAVENOUS

## 2018-11-14 MED ORDER — SODIUM CHLORIDE 0.9 % IV SOLN: 250.0000 mL | INTRAVENOUS | Status: DC

## 2018-11-14 NOTE — Progress Notes (Signed)
Per RN, patient refused use of CPAP for the evening. Informed RN to call if patient should change her mind.

## 2018-11-14 NOTE — Progress Notes (Signed)
[place] for Warfarin Indication: chest pain/ACS  Allergies  Allergen Reactions  . Bee Venom Anaphylaxis  . Other Nausea And Vomiting and Other (See Comments)    Pain medications cause severe vomiting. Tolerated slow IV morphine drip  . Amiodarone Nausea Only  . Prednisone Palpitations and Other (See Comments)    "Rapid Heart Beat"    Patient Measurements: Height: 5' (152.4 cm) Weight: 169 lb 15.6 oz (77.1 kg) IBW/kg (Calculated) : 45.5 Heparin Dosing Weight: 63 kg  Vital Signs: Temp: 98.6 F (37 C) (10/11 0745) Temp Source: Oral (10/11 0745) BP: 145/87 (10/11 0745) Pulse Rate: 107 (10/11 0745)  Labs: Recent Labs    11/11/18 2040  11/12/18 0515 11/12/18 0521 11/13/18 0456 11/13/18 1246 11/14/18 0532  HGB  --    < > 13.7  --  14.0  --  13.3  HCT  --   --  42.6  --  41.8  --  42.2  PLT  --   --  200  --  205  --  212  LABPROT  --    < > 18.9*  --  20.8* 23.7* 23.6*  INR  --    < > 1.6*  --  1.8* 2.2* 2.1*  HEPARINUNFRC 0.53  --   --  0.58 0.49  --   --   CREATININE 1.34*  --  1.03*  --  1.22* 1.23* 1.32*   < > = values in this interval not displayed.    Estimated Creatinine Clearance: 28.1 mL/min (A) (by C-G formula based on SCr of 1.32 mg/dL (H)).   Assessment: Patient is a 85 year old female who presents to the ED for evaluation of chest pain. She is noted with NSTEMI s/p cath on heparin (on warfarin PTA for afib).   Heparin off yesterday with therapeutic INR.   INR still at goal this morning, warfarin resumed 10/8. No bleeding or issues with line reported, cbc stable.  Will watch INR closely on tamoxifen.   Goal of Therapy:  INR goal 2-3 Monitor platelets by anticoagulation protocol: Yes   Plan:  Warfarin 4mg  tonight to keep INR up, then back down on dosing closer to home dose DCCV cancelled for today Follow-up daily heparin level and CBC  Erin Hearing PharmD., BCPS Clinical Pharmacist 11/14/2018 9:15  AM  c

## 2018-11-14 NOTE — H&P (View-Only) (Signed)
Here for DCCV   Apparently pt drank this am As am going to be in [place] for the next hours, not available to do DCCV before 2 pm Hence have rescheduled it for tomorrow

## 2018-11-14 NOTE — Progress Notes (Signed)
Patient woke up and sitting on the side of the bed she stated she was having some chest pressure and asked for a SL Nitro tablet. One tablet given to patient and after 5 minutes she was pain free. Patient wore her C-Pap for about 5 hours through the night and she stated she was worried about several things she needed to take care of at home. Patient has anxiety levels that make her very nervous. After spending some time with her and allowing her to voice her concerns she stated she felt better. She was able to lay down and get a nap. Patient is aware of her anxiety issues. VSS remain stable while on Tikosyn, although QTC's are ranging greater than 500. MD's are aware. Will continue to monitor patient closely.

## 2018-11-14 NOTE — Anesthesia Preprocedure Evaluation (Deleted)
Anesthesia Evaluation    Reviewed: Allergy & Precautions, Patient's Chart, lab work & pertinent test results  Airway        Dental   Pulmonary sleep apnea and Continuous Positive Airway Pressure Ventilation , former smoker,           Cardiovascular hypertension, + angina + CAD, + Past MI, + Cardiac Stents and +CHF  + dysrhythmias Atrial Fibrillation   complex coronary history with her most recently orbital atherectomy and DES of the distal LCX and LAD DES [id number], chronic systolic and diastolic heart failure LVEF 40-45%, paroxysmal atrial fibrillation, SSS s/p PPM   Neuro/Psych negative neurological ROS     GI/Hepatic negative GI ROS, Neg liver ROS,   Endo/Other  negative endocrine ROS  Renal/GU Renal InsufficiencyRenal disease     Musculoskeletal negative musculoskeletal ROS (+)   Abdominal   Peds  Hematology negative hematology ROS (+)   Anesthesia Other Findings Day of surgery medications reviewed with the patient.  Reproductive/Obstetrics Ovarian Ca, S/P chemo                             Anesthesia Physical Anesthesia Plan  ASA: III  Anesthesia Plan: General   Post-op Pain Management:    Induction: Intravenous  PONV Risk Score and Plan: 3 and Propofol infusion and Treatment may vary due to age or medical condition  Airway Management Planned: Mask  Additional Equipment:   Intra-op Plan:   Post-operative Plan:   Informed Consent:   Plan Discussed with:   Anesthesia Plan Comments:         Anesthesia Quick Evaluation

## 2018-11-14 NOTE — Progress Notes (Signed)
Pt is aware to be NPO from midnight for DCCV procedure tomorrow, family members updated, had one episode of chest pressure this am and nitro SL given, but denies SOB and chest pain, Family members updated, will continue to monitor the patient.  Palma Holter, RN

## 2018-11-14 NOTE — Progress Notes (Signed)
Here for DCCV   Apparently pt drank this am As am going to be in [place] for the next hours, not available to do DCCV before 2 pm Hence have rescheduled it for tomorrow

## 2018-11-14 NOTE — Plan of Care (Signed)
  Problem: Education: Goal: Understanding of CV disease, CV risk reduction, and recovery process will improve 11/14/2018 1825 by Neil Crouch, RN Outcome: Progressing 11/14/2018 1531 by Neil Crouch, RN Outcome: Progressing Goal: Individualized Educational Video(s) 11/14/2018 1825 by Neil Crouch, RN Outcome: Progressing 11/14/2018 1531 by Neil Crouch, RN Outcome: Progressing   Problem: Activity: Goal: Ability to return to baseline activity level will improve 11/14/2018 1825 by Neil Crouch, RN Outcome: Progressing 11/14/2018 1531 by Neil Crouch, RN Outcome: Progressing   Problem: Cardiovascular: Goal: Ability to achieve and maintain adequate cardiovascular perfusion will improve 11/14/2018 1825 by Neil Crouch, RN Outcome: Progressing 11/14/2018 1531 by Neil Crouch, RN Outcome: Progressing Goal: Vascular access site(s) Level 0-1 will be maintained 11/14/2018 1825 by Neil Crouch, RN Outcome: Progressing 11/14/2018 1531 by Neil Crouch, RN Outcome: Progressing   Problem: Health Behavior/Discharge Planning: Goal: Ability to safely manage health-related needs after discharge will improve 11/14/2018 1825 by Neil Crouch, RN Outcome: Progressing 11/14/2018 1531 by Neil Crouch, RN Outcome: Progressing   Problem: Education: Goal: Knowledge of disease or condition will improve 11/14/2018 1825 by Neil Crouch, RN Outcome: Progressing 11/14/2018 1531 by Neil Crouch, RN Outcome: Progressing Goal: Understanding of medication regimen will improve 11/14/2018 1825 by Neil Crouch, RN Outcome: Progressing 11/14/2018 1531 by Neil Crouch, RN Outcome: Progressing Goal: Individualized Educational Video(s) 11/14/2018 1825 by Neil Crouch, RN Outcome: Progressing 11/14/2018 1531 by Neil Crouch, RN Outcome: Progressing   Problem: Activity: Goal: Ability to tolerate increased activity will improve 11/14/2018 1825 by Neil Crouch, RN Outcome: Progressing 11/14/2018 1531 by  Neil Crouch, RN Outcome: Progressing   Problem: Cardiac: Goal: Ability to achieve and maintain adequate cardiopulmonary perfusion will improve 11/14/2018 1825 by Neil Crouch, RN Outcome: Progressing 11/14/2018 1531 by Neil Crouch, RN Outcome: Progressing   Problem: Health Behavior/Discharge Planning: Goal: Ability to safely manage health-related needs after discharge will improve 11/14/2018 1825 by Neil Crouch, RN Outcome: Progressing 11/14/2018 1531 by Neil Crouch, RN Outcome: Progressing

## 2018-11-14 NOTE — Progress Notes (Signed)
Pharmacy: Dofetilide (Tikosyn) - Follow Up Assessment and Electrolyte Replacement  Pharmacy consulted to assist in monitoring and replacing electrolytes in this 85 y.o. female admitted on 11/09/2018 undergoing dofetilide initiation. First dofetilide dose: 125 mcg on 10/8 PM.   Labs:    Component Value Date/Time   K 4.4 11/14/2018 0532   K 4.2 12/14/2014 1324   MG 2.2 11/14/2018 0532     Plan: Potassium: K >/= 4: No additional supplementation needed  Magnesium: Mg > 2: No additional supplementation needed   Thank you for allowing pharmacy to participate in this patient's care   Erin Hearing PharmD., BCPS Clinical Pharmacist 11/14/2018 9:17 AM

## 2018-11-14 NOTE — Plan of Care (Signed)
  Problem: Education: Goal: Understanding of CV disease, CV risk reduction, and recovery process will improve Outcome: Progressing Goal: Individualized Educational Video(s) Outcome: Progressing   Problem: Activity: Goal: Ability to return to baseline activity level will improve Outcome: Progressing   Problem: Cardiovascular: Goal: Ability to achieve and maintain adequate cardiovascular perfusion will improve Outcome: Progressing Goal: Vascular access site(s) Level 0-1 will be maintained Outcome: Progressing   Problem: Health Behavior/Discharge Planning: Goal: Ability to safely manage health-related needs after discharge will improve Outcome: Progressing   Problem: Education: Goal: Knowledge of disease or condition will improve Outcome: Progressing Goal: Understanding of medication regimen will improve Outcome: Progressing Goal: Individualized Educational Video(s) Outcome: Progressing   Problem: Activity: Goal: Ability to tolerate increased activity will improve Outcome: Progressing   Problem: Cardiac: Goal: Ability to achieve and maintain adequate cardiopulmonary perfusion will improve Outcome: Progressing   Problem: Health Behavior/Discharge Planning: Goal: Ability to safely manage health-related needs after discharge will improve Outcome: Progressing

## 2018-11-15 ENCOUNTER — Encounter (HOSPITAL_COMMUNITY): Payer: Self-pay | Admitting: *Deleted

## 2018-11-15 ENCOUNTER — Inpatient Hospital Stay (HOSPITAL_COMMUNITY): Payer: Medicare Other | Admitting: Certified Registered Nurse Anesthetist

## 2018-11-15 ENCOUNTER — Encounter (HOSPITAL_COMMUNITY)
Admission: EM | Disposition: A | Discharge status: Home Health | Payer: Self-pay | Source: Home / Self Care | Attending: Cardiovascular Disease

## 2018-11-15 ENCOUNTER — Telehealth: Payer: Self-pay | Admitting: Interventional Cardiology

## 2018-11-15 DIAGNOSIS — I4892 Unspecified atrial flutter: Secondary | ICD-10-CM

## 2018-11-15 DIAGNOSIS — I471 Supraventricular tachycardia: Secondary | ICD-10-CM

## 2018-11-15 HISTORY — PX: CARDIOVERSION: SHX1299

## 2018-11-15 LAB — BASIC METABOLIC PANEL
Anion gap: 10 (ref 5–15)
BUN: 25 mg/dL — ABNORMAL HIGH (ref 8–23)
CO2: 26 mmol/L (ref 22–32)
Calcium: 8.8 mg/dL — ABNORMAL LOW (ref 8.9–10.3)
Chloride: 103 mmol/L (ref 98–111)
Creatinine, Ser: 1.23 mg/dL — ABNORMAL HIGH (ref 0.44–1.00)
GFR calc Af Amer: 46 mL/min — ABNORMAL LOW (ref >60)
GFR calc non Af Amer: 40 mL/min — ABNORMAL LOW (ref >60)
Glucose, Bld: 184 mg/dL — ABNORMAL HIGH (ref 70–99)
Potassium: 3.9 mmol/L (ref 3.5–5.1)
Sodium: 139 mmol/L (ref 135–145)

## 2018-11-15 LAB — MAGNESIUM: Magnesium: 2 mg/dL (ref 1.7–2.4)

## 2018-11-15 LAB — PROTIME-INR
INR: 2.5 — ABNORMAL HIGH (ref 0.8–1.2)
Prothrombin Time: 26.8 seconds — ABNORMAL HIGH (ref 11.4–15.2)

## 2018-11-15 LAB — GLUCOSE, CAPILLARY: Glucose-Capillary: 175 mg/dL — ABNORMAL HIGH (ref 70–99)

## 2018-11-15 SURGERY — CARDIOVERSION
Anesthesia: General

## 2018-11-15 SURGERY — CARDIOVERSION
Anesthesia: Choice

## 2018-11-15 MED ORDER — POTASSIUM CHLORIDE CRYS ER 20 MEQ PO TBCR
20.0000 meq | EXTENDED_RELEASE_TABLET | Freq: Once | ORAL | Status: AC
  Administered 2018-11-15: 20 meq via ORAL
  Filled 2018-11-15: qty 1

## 2018-11-15 MED ORDER — PROPOFOL 10 MG/ML IV BOLUS
INTRAVENOUS | Status: DC | PRN
  Administered 2018-11-15: 50 mg via INTRAVENOUS

## 2018-11-15 MED ORDER — LIDOCAINE 2% (20 MG/ML) 5 ML SYRINGE
INTRAMUSCULAR | Status: DC | PRN
  Administered 2018-11-15: 60 mg via INTRAVENOUS

## 2018-11-15 MED ORDER — SODIUM CHLORIDE 0.9 % IV SOLN
INTRAVENOUS | Status: DC | PRN
  Administered 2018-11-15: 09:00:00 via INTRAVENOUS

## 2018-11-15 MED ORDER — POTASSIUM CHLORIDE CRYS ER 20 MEQ PO TBCR
40.0000 meq | EXTENDED_RELEASE_TABLET | Freq: Once | ORAL | Status: AC
  Administered 2018-11-15: 40 meq via ORAL
  Filled 2018-11-15: qty 2

## 2018-11-15 MED ORDER — WARFARIN SODIUM 2 MG PO TABS
2.0000 mg | ORAL_TABLET | Freq: Once | ORAL | Status: AC
  Administered 2018-11-15: 2 mg via ORAL
  Filled 2018-11-15: qty 1

## 2018-11-15 NOTE — Transfer of Care (Signed)
Immediate Anesthesia Transfer of Care Note  Patient: Patty [name]  Procedure(s) Performed: CARDIOVERSION (N/A )  Patient Location: Endoscopy Unit  Anesthesia Type:General  Level of Consciousness: drowsy  Airway & Oxygen Therapy: Patient Spontanous Breathing  Post-op Assessment: Report given to RN and Post -op Vital signs reviewed and stable  Post vital signs: Reviewed  Last Vitals:  Vitals Value Taken Time  BP    Temp    Pulse    Resp    SpO2      Last Pain:  Vitals:   11/15/18 0808  TempSrc: Oral  PainSc: 0-No pain      Patients Stated Pain Goal: 0 ([id number])  Complications: No apparent anesthesia complications

## 2018-11-15 NOTE — Telephone Encounter (Signed)
New message   *STAT* If patient is at the pharmacy, call can be transferred to refill team.   1. Which medications need to be refilled? (please list name of each medication and dose if known) Tikosin   2. Which pharmacy/location (including street and city if local pharmacy) is medication to be sent to? [facility] [place], [place] - [place]  3. Do they need a 30 day or 90 day supply? 90 day

## 2018-11-15 NOTE — Progress Notes (Signed)
[place] for Warfarin Indication: chest pain/ACS  Allergies  Allergen Reactions  . Bee Venom Anaphylaxis  . Other Nausea And Vomiting and Other (See Comments)    Pain medications cause severe vomiting. Tolerated slow IV morphine drip  . Oxycodone Hcl Nausea And Vomiting  . Amiodarone Nausea Only  . Prednisone Palpitations and Other (See Comments)    "Rapid Heart Beat"    Patient Measurements: Height: 5' (152.4 cm) Weight: 168 lb 10.4 oz (76.5 kg) IBW/kg (Calculated) : 45.5 Heparin Dosing Weight: 63 kg  Vital Signs: Temp: 96.8 F (36 C) (10/12 0902) Temp Source: Temporal (10/12 0902) BP: 117/45 (10/12 0912) Pulse Rate: 60 (10/12 0912)  Labs: Recent Labs    11/13/18 0456 11/13/18 1246 11/14/18 0532 11/15/18 0408  HGB 14.0  --  13.3  --   HCT 41.8  --  42.2  --   PLT 205  --  212  --   LABPROT 20.8* 23.7* 23.6* 26.8*  INR 1.8* 2.2* 2.1* 2.5*  HEPARINUNFRC 0.49  --   --   --   CREATININE 1.22* 1.23* 1.32*  --     Estimated Creatinine Clearance: 28 mL/min (A) (by C-G formula based on SCr of 1.32 mg/dL (H)).   Assessment: Patient is a 85 year old female who presents to the ED for evaluation of chest pain. She is noted with NSTEMI s/p cath on heparin (on warfarin PTA for afib). Heparin continued while INR subtherapeutic, now off and INR remains within goal range at 2.5. Pt s/p DCCV to NSR this morning.  Goal of Therapy:  INR goal 2-3 Monitor platelets by anticoagulation protocol: Yes   Plan:  -Warfarin 2mg  PO x1 tonight -Daily protime  Arrie Senate, PharmD, BCPS Clinical Pharmacist [phone] Please check AMION for all [facility] numbers 11/15/2018

## 2018-11-15 NOTE — Progress Notes (Signed)
Pharmacy: Dofetilide (Tikosyn) - Follow Up Assessment and Electrolyte Replacement  Pharmacy consulted to assist in monitoring and replacing electrolytes in this 85 y.o. female admitted on 11/09/2018 undergoing dofetilide initiation. First dofetilide dose: 125 mcg on 10/8 PM.   Labs:    Component Value Date/Time   K 3.9 11/15/2018 1115   K 4.2 12/14/2014 1324   MG 2.0 11/15/2018 1115     Plan: Potassium: K 3.8-3.9:  Give KCl 40 mEq po x1   Magnesium: Mg > 2: No additional supplementation needed   Thank you for allowing pharmacy to participate in this patient's care   Arrie Senate, PharmD, BCPS Clinical Pharmacist [phone] Please check AMION for all [place] numbers 11/15/2018

## 2018-11-15 NOTE — Anesthesia Procedure Notes (Signed)
Date/Time: 11/15/2018 8:48 AM Performed by: Janene Harvey, CRNA Pre-anesthesia Checklist: Patient identified, Emergency Drugs available, Suction available and Patient being monitored Patient Re-evaluated:Patient Re-evaluated prior to induction Oxygen Delivery Method: Ambu bag Dental Injury: Teeth and Oropharynx as per pre-operative assessment

## 2018-11-15 NOTE — CV Procedure (Signed)
    Cardioversion Note  Patty [name] [id number] 10-20-1930  Procedure: DC Cardioversion Indications: atrial flutter  Procedure Details Consent: Obtained Time Out: Verified patient identification, verified procedure, site/side was marked, verified correct patient position, special equipment/implants available, Radiology Safety Procedures followed,  medications/allergies/relevent history reviewed, required imaging and test results available.  Performed  The patient has been on adequate anticoagulation.  The patient received IV propofol 50 mg and 60 mg of iv Lidocain for sedation.  Synchronous cardioversion was performed at 120 joules.  The cardioversion was successful.  Complications: No apparent complications Patient did tolerate procedure well.  Ena Dawley, MD, [facility] 11/15/2018, 9:06 AM

## 2018-11-15 NOTE — Anesthesia Postprocedure Evaluation (Signed)
Anesthesia Post Note  Patient: Patty [name]  Procedure(s) Performed: CARDIOVERSION (N/A )     Patient location during evaluation: PACU Anesthesia Type: General Level of consciousness: awake and alert Pain management: pain level controlled Vital Signs Assessment: post-procedure vital signs reviewed and stable Respiratory status: spontaneous breathing, nonlabored ventilation, respiratory function stable and patient connected to nasal cannula oxygen Cardiovascular status: blood pressure returned to baseline and stable Postop Assessment: no apparent nausea or vomiting Anesthetic complications: no    Last Vitals:  Vitals:   11/15/18 0912 11/15/18 1011  BP: (!) 117/45 (!) 151/72  Pulse: 60 92  Resp: (!) 22 18  Temp:  37.3 C  SpO2: 91%     Last Pain:  Vitals:   11/15/18 0912  TempSrc:   PainSc: 0-No pain                 Briggs Edelen S

## 2018-11-15 NOTE — Interval H&P Note (Signed)
History and Physical Interval Note:  11/15/2018 8:12 AM  Patty [name]  has presented today for surgery, with the diagnosis of atrial fibrillation.  The various methods of treatment have been discussed with the patient and family. After consideration of risks, benefits and other options for treatment, the patient has consented to  Procedure(s): CARDIOVERSION (N/A) as a surgical intervention.  The patient's history has been reviewed, patient examined, no change in status, stable for surgery.  I have reviewed the patient's chart and labs.  Questions were answered to the patient's satisfaction.     Ena Dawley

## 2018-11-15 NOTE — Anesthesia Preprocedure Evaluation (Signed)
Anesthesia Evaluation    Airway Mallampati: II  TM Distance: >3 FB Neck ROM: Full    Dental no notable dental hx.    Pulmonary sleep apnea and Continuous Positive Airway Pressure Ventilation , former smoker,    Pulmonary exam normal breath sounds clear to auscultation       Cardiovascular hypertension, + CAD, + Past MI, + Cardiac Stents, + Peripheral Vascular Disease, +CHF and + DOE  + pacemaker  Rhythm:Irregular Rate:Normal     Neuro/Psych    GI/Hepatic   Endo/Other    Renal/GU      Musculoskeletal   Abdominal   Peds  Hematology   Anesthesia Other Findings   Reproductive/Obstetrics                             Anesthesia Physical Anesthesia Plan  ASA: III  Anesthesia Plan: General   Post-op Pain Management:    Induction: Intravenous  PONV Risk Score and Plan: 0  Airway Management Planned: Mask  Additional Equipment:   Intra-op Plan:   Post-operative Plan: Extubation in OR  Informed Consent: I have reviewed the patients History and Physical, chart, labs and discussed the procedure including the risks, benefits and alternatives for the proposed anesthesia with the patient or authorized representative who has indicated his/her understanding and acceptance.     Dental advisory given  Plan Discussed with: CRNA and Surgeon  Anesthesia Plan Comments:         Anesthesia Quick Evaluation

## 2018-11-15 NOTE — Progress Notes (Signed)
Remote pacemaker transmission.   

## 2018-11-15 NOTE — Progress Notes (Addendum)
CSW called [facility] and left a message for Dr. Oval Linsey to send Tikosyn prescription to the [facility] pharmacy at [facility].   CSW is awaiting a return phone call.   Domenic Schwab, MSW, [place] Worker [facility]  [phone]

## 2018-11-15 NOTE — Progress Notes (Addendum)
Progress Note  Patient Name: Patty [name] Date of Encounter: 11/15/2018  Primary Cardiologist: Larae Grooms, MD  Electrophysiologist: Dr. [name]  Subjective   No CP or SOB this AM   Seen again at 1700 hrs.  Feels considerably better following cardioversion.  Inpatient Medications    Scheduled Meds: . atorvastatin  80 mg Oral Daily  . calcium carbonate  1 tablet Oral QPM   And  . cholecalciferol  800 Units Oral QPM  . Chlorhexidine Gluconate Cloth  6 each Topical Daily  . clopidogrel  75 mg Oral Daily  . dofetilide  125 mcg Oral BID  . furosemide  80 mg Oral Daily  . gabapentin  300 mg Oral BID  . isosorbide mononitrate  60 mg Oral Daily  . metoprolol succinate  200 mg Oral Daily  . multivitamin  1 tablet Oral BID  . multivitamin with minerals  1 tablet Oral Daily  . potassium chloride SA  20 mEq Oral Q supper  . sodium chloride flush  3 mL Intravenous Q12H  . spironolactone  25 mg Oral QODAY  . tamoxifen  10 mg Oral Daily  . Warfarin - Pharmacist Dosing Inpatient   Does not apply q1800   Continuous Infusions: . sodium chloride     PRN Meds: acetaminophen, nitroGLYCERIN, ondansetron (ZOFRAN) IV, sodium chloride flush, sodium chloride flush   Vital Signs    Vitals:   11/14/18 2324 11/15/18 0355 11/15/18 0500 11/15/18 0717  BP: 115/70 131/75  119/68  Pulse: 81   (!) 108  Resp:      Temp: 97.8 F (36.6 C) 98 F (36.7 C)  97.6 F (36.4 C)  TempSrc: Oral Oral  Oral  SpO2: 96% 96%  93%  Weight:   76.5 kg   Height:        Intake/Output Summary (Last 24 hours) at 11/15/2018 0724 Last data filed at 11/14/2018 1308 Gross per 24 hour  Intake 480 ml  Output -  Net 480 ml   Last 3 Weights 11/15/2018 11/14/2018 11/13/2018  Weight (lbs) 168 lb 10.4 oz 169 lb 15.6 oz 170 lb 6.7 oz  Weight (kg) 76.5 kg 77.1 kg 77.3 kg      Telemetry    AFlu 2:1 AVblock  90's-120's - Personally Reviewed  ECG    AFib 96bpm, IVCD, measured QT 400-468ms, QRS 162ms -  Personally Reviewed  Physical Exam   GEN: No acute distress.   Neck: No JVD Cardiac: irreg-irrg, tachycardic, no murmurs, rubs, or gallops.  Respiratory: CTA b/l GI: Soft, nontender, non-distended  MS: No edema; No deformity. Neuro:  Nonfocal  Psych: Normal affect   On my exam at 1700 hrs. her neck veins were flat her rhythm was regular there was no edema in the lungs were clear   Labs    High Sensitivity Troponin:   Recent Labs  Lab 11/09/18 1240 11/09/18 1623 11/09/18 1840 11/11/18 0637 11/11/18 0819  TROPONINIHS 262* 906* 1,130* 1,177* 976*      Chemistry Recent Labs  Lab 11/13/18 0456 11/13/18 1246 11/14/18 0532  NA 140 141 138  K 4.2 3.9 4.4  CL 106 106 104  CO2 26 26 25   GLUCOSE 94 134* 92  BUN 27* 25* 26*  CREATININE 1.22* 1.23* 1.32*  CALCIUM 8.8* 8.8* 8.7*  GFRNONAA 40* 40* 36*  GFRAA 46* 46* 42*  ANIONGAP 8 9 9      Hematology Recent Labs  Lab 11/12/18 0515 11/13/18 0456 11/14/18 0532  WBC 7.0 6.2 6.0  RBC 4.20 4.14 4.12  HGB 13.7 14.0 13.3  HCT 42.6 41.8 42.2  MCV 101.4* 101.0* 102.4*  MCH 32.6 33.8 32.3  MCHC 32.2 33.5 31.5  RDW 15.9* 16.0* 16.3*  PLT 200 205 212    BNP Recent Labs  Lab 11/09/18 1041  BNP 664.9*     DDimer No results for input(s): DDIMER in the last 168 hours.   Radiology    No results found.  Cardiac Studies   11/12/2018: TTE IMPRESSIONS  1. Left ventricular ejection fraction, by visual estimation, is 40 to 45%. The left ventricle has moderately decreased function. Normal left ventricular size. There is mildly increased left ventricular hypertrophy.  2. Mid and apical inferior septum and apex are abnormal.  3. Global right ventricle has normal systolic function.The right ventricular size is normal. No increase in right ventricular wall thickness.  4. Left atrial size was moderately dilated.  5. Right atrial size was normal.  6. The mitral valve is normal in structure. No evidence of mitral valve  regurgitation. No evidence of mitral stenosis.  7. The tricuspid valve is normal in structure. Tricuspid valve regurgitation was not visualized by color flow Doppler.  8. The aortic valve is tricuspid Aortic valve regurgitation is trivial by color flow Doppler. Mild aortic valve sclerosis without stenosis.  9. The pulmonic valve was normal in structure. Pulmonic valve regurgitation is not visualized by color flow Doppler. 10. The inferior vena cava is normal in size with greater than 50% respiratory variability, suggesting right atrial pressure of 3 mmHg.  FINDINGS  Left Ventricle: Left ventricular ejection fraction, by visual estimation, is 40 to 45%. The left ventricle has moderately decreased function. There is mildly increased left ventricular hypertrophy. Normal left ventricular size.   11/10/2018: LHC  Ost LAD to Mid LAD lesion is 20% stenosed.  Mid LAD lesion is 30% stenosed.  Ost 2nd Mrg to 2nd Mrg lesion is 60% stenosed.  Ost 1st Mrg to 1st Mrg lesion is 100% stenosed.  Ost RPDA to RPDA lesion is 50% stenosed.  Ost Cx to Prox Cx lesion is 40% stenosed.  Prox Cx to Mid Cx lesion is 20% stenosed.  1st Mrg lesion is 100% stenosed.  Previously placed Dist Cx stent (unknown type) is widely patent.  2nd Mrg lesion is 90% stenosed.  There is severe left ventricular systolic dysfunction.  LV end diastolic pressure is mildly elevated.  The left ventricular ejection fraction is less than 25% by visual estimate.  Ost RCA to Mid RCA lesion is 40% stenosed.  Mid RCA to Dist RCA lesion is 20% stenosed.   1.  Underlying three-vessel coronary artery disease with widely patent stents in the LAD, left circumflex and right coronary arteries.  OM1 is occluded with collaterals.  OM 2 is diffusely diseased and small.  There is moderate restenosis in the ostial left circumflex stent.  Mild to moderate restenosis in the RCA stents.  Ostial left circumflex stents appears to have  significant restenosis at the ostium in certain views but not in other views.  I reviewed previous cardiac catheterization and the appearance does not appear significantly different. 2.  Severely reduced LV systolic function with an EF of 20 to 25% with mildly elevated left ventricular end-diastolic pressure.  Recommendations: The patient was in A. fib with RVR during the procedure which made it difficult.  She was given 2 doses of IV metoprolol.  I suspect that her elevated troponin is likely due to supply demand ischemia in the  setting of worsening cardiomyopathy and A. fib with RVR.  Recommend EP consultation to manage atrial fibrillation.  Resume heparin 8 hours after sheath pull.  I discontinued diltiazem given degree of cardiomyopathy.  Overall prognosis is poor given age and multiple comorbidities.   Patient Profile     85 y.o. female w/PMHx of CAD (multiple prior stents), HTN, HLD, persistent AFib,    Device information: SJM nanostim 2014 (remains in place)  >> early battery depletion >> SJM dual chamber PPM, implanted 01/27/27, symptomatic bradycardia w/PPM  Came in 11/10/2018 with CP >> cath without progression of disease, no targets, no intervention.  Abn Trop felt to be demand in setting of rapid AFib, EP asked to the case >>> started on Tikosyn (h/o GI intolerance and ? Abn LFTs w/amio historically)  Assessment & Plan    1.  Longstanding persistent AFib      CHA2DS2Vasc is 6, on warfarin      INR 2.5  (covered with heparin gtt here for cath while INR was subtherapeutic)      Tikosyn load is completed, s/p # 6 yesterday      Unable to do DCCV yesterday (pt drank something in the AM)      DCCV this AM        Discussed DCCV potential risks and benefits, her questions answered, she remains agreeable to proceed       QT looks OK given BBB      No other labs this AM, K+, Mag, Creat has been stable  Anticipate discharge tomorrow   2. CAD 3. Demand ischemia     No active CP      On Plavix, statin, nitrate, BB     Management with primary cardiology team   4. Acute/chronic CHF (diastolic)     Exam this AM appears euvolemic     Stable weight, generally trending down, though I/O fluid +..... ?     For questions or updates, please contact [place] Please consult [email or web address] for contact info under    Signed, Baldwin [place], PA-C  11/15/2018, 7:24 AM    AFlutter 2:1 HR 120   Atach 2:1 HR 75  Dofetilide   Pace[employer]aker [employer]   Cardiomyopathy  Ischemic and NonIschemic  CHF A/C systolic  Renal Insufficiency Gd 3  Will defer next step to Dr Greggory Brandy Options include following her now that her rate is slower.  I am not sanguine that it wil be stable. Hence would lean towards AV ablation and given her base line QRS prolongation might consider deferring device upgrade until we know that rate control of her aflu/fib hasnot been sufficient to control her symptoms  She however is interested in going home and it may be reasonable to let her go home with a pulse oximeter to track her heart rates.  Euvolemic continue current meds

## 2018-11-15 NOTE — Plan of Care (Signed)
  Problem: Education: Goal: Understanding of CV disease, CV risk reduction, and recovery process will improve Outcome: Progressing Goal: Individualized Educational Video(s) Outcome: Progressing   Problem: Activity: Goal: Ability to return to baseline activity level will improve Outcome: Progressing   Problem: Cardiovascular: Goal: Ability to achieve and maintain adequate cardiovascular perfusion will improve Outcome: Progressing Goal: Vascular access site(s) Level 0-1 will be maintained Outcome: Progressing   Problem: Health Behavior/Discharge Planning: Goal: Ability to safely manage health-related needs after discharge will improve Outcome: Progressing   Problem: Education: Goal: Knowledge of disease or condition will improve Outcome: Progressing Goal: Understanding of medication regimen will improve Outcome: Progressing Goal: Individualized Educational Video(s) Outcome: Progressing   Problem: Activity: Goal: Ability to tolerate increased activity will improve Outcome: Progressing   Problem: Cardiac: Goal: Ability to achieve and maintain adequate cardiopulmonary perfusion will improve Outcome: Progressing   Problem: Health Behavior/Discharge Planning: Goal: Ability to safely manage health-related needs after discharge will improve Outcome: Progressing

## 2018-11-15 NOTE — Progress Notes (Signed)
Patient refused CPAP tonight 

## 2018-11-16 ENCOUNTER — Encounter (HOSPITAL_COMMUNITY): Payer: Self-pay | Admitting: Cardiology

## 2018-11-16 LAB — PROTIME-INR
INR: 2.8 — ABNORMAL HIGH (ref 0.8–1.2)
Prothrombin Time: 29.2 seconds — ABNORMAL HIGH (ref 11.4–15.2)

## 2018-11-16 LAB — BASIC METABOLIC PANEL
Anion gap: 11 (ref 5–15)
BUN: 30 mg/dL — ABNORMAL HIGH (ref 8–23)
CO2: 25 mmol/L (ref 22–32)
Calcium: 8.9 mg/dL (ref 8.9–10.3)
Chloride: 106 mmol/L (ref 98–111)
Creatinine, Ser: 1.21 mg/dL — ABNORMAL HIGH (ref 0.44–1.00)
GFR calc Af Amer: 47 mL/min — ABNORMAL LOW (ref >60)
GFR calc non Af Amer: 40 mL/min — ABNORMAL LOW (ref >60)
Glucose, Bld: 72 mg/dL (ref 70–99)
Potassium: 4.3 mmol/L (ref 3.5–5.1)
Sodium: 142 mmol/L (ref 135–145)

## 2018-11-16 LAB — MAGNESIUM: Magnesium: 2.2 mg/dL (ref 1.7–2.4)

## 2018-11-16 MED ORDER — DOFETILIDE 125 MCG PO CAPS: 125.0000 ug | ORAL_CAPSULE | Freq: Two times a day (BID) | ORAL | 6 refills | Status: DC

## 2018-11-16 MED ORDER — HEPARIN SOD (PORK) LOCK FLUSH 100 UNIT/ML IV SOLN
500.0000 [IU] | INTRAVENOUS | Status: AC | PRN
  Administered 2018-11-16: 500 [IU]
  Filled 2018-11-16: qty 5

## 2018-11-16 MED ORDER — WARFARIN SODIUM 1 MG PO TABS
1.0000 mg | ORAL_TABLET | Freq: Once | ORAL | Status: DC
  Filled 2018-11-16: qty 1

## 2018-11-16 MED ORDER — METOPROLOL SUCCINATE ER 200 MG PO TB24: 200.0000 mg | ORAL_TABLET | Freq: Every day | ORAL | 6 refills | Status: DC

## 2018-11-16 MED FILL — METOPROLOL SUCCINATE ER 100: 100 | 30 days supply | Qty: 60 | Fill #0

## 2018-11-16 MED FILL — DOFETILIDE 125 MCG CAPS: 125 | 30 days supply | Qty: 60 | Fill #0

## 2018-11-16 NOTE — Progress Notes (Signed)
Pt take to front of hospital via w/c to meet her friend. Pt discharged home. Pt awting to hear from SW on her PT/OT [facility] assignment for evaluation .

## 2018-11-16 NOTE — Discharge Summary (Addendum)
DISCHARGE SUMMARY    Patient ID: Patty [name],  MRN: [id number], DOB/AGE: 09/23/1930 85 y.o.  Admit date: 11/09/2018 Discharge date: 11/16/2018  Primary Care Physician: [name], MD  Primary Cardiologist: Dr. Irish Lack Electrophysiologist: Dr. [name]  Primary Discharge Diagnosis:  1.  Acute on chronic CHF exacerbation (systolic) 2.  Persistent atrial fibrillation, atrial tachycardia 3.  NSTEMI   Secondary Discharge Diagnosis:  1. CKD (III) 2. HTN 3. HLD 4. LBBB 5. Tachy-brady w/PPM 6. CAD     Multiple prior PCIs 7. OSA  Allergies  Allergen Reactions   Bee Venom Anaphylaxis   Other Nausea And Vomiting and Other (See Comments)    Pain medications cause severe vomiting. Tolerated slow IV morphine drip   Oxycodone Hcl Nausea And Vomiting   Amiodarone Nausea Only   Prednisone Palpitations and Other (See Comments)    "Rapid Heart Beat"     Procedures This Admission:  1.LHC 11/10/18:  Ost LAD to Mid LAD lesion is 20% stenosed.  Mid LAD lesion is 30% stenosed.  Ost 2nd Mrg to 2nd Mrg lesion is 60% stenosed.  Ost 1st Mrg to 1st Mrg lesion is 100% stenosed.  Ost RPDA to RPDA lesion is 50% stenosed.  Ost Cx to Prox Cx lesion is 40% stenosed.  Prox Cx to Mid Cx lesion is 20% stenosed.  1st Mrg lesion is 100% stenosed.  Previously placed Dist Cx stent (unknown type) is widely patent.  2nd Mrg lesion is 90% stenosed.  There is severe left ventricular systolic dysfunction.  LV end diastolic pressure is mildly elevated.  The left ventricular ejection fraction is less than 25% by visual estimate.  Ost RCA to Mid RCA lesion is 40% stenosed.  Mid RCA to Dist RCA lesion is 20% stenosed.  1. Underlying three-vessel coronary artery disease with widely patent stents in the LAD, left circumflex and right coronary arteries. OM1 is occluded with collaterals. OM 2 is diffusely diseased and small. There is moderate restenosis in the ostial left  circumflex stent. Mild to moderate restenosis in the RCA stents. Ostial left circumflex stents appears to have significant restenosis at the ostium in certain views but not in other views. I reviewed previous cardiac catheterization and the appearance does not appear significantly different. 2. Severely reduced LV systolic function with an EF of 20 to 25% with mildly elevated left ventricular end-diastolic pressure.    2.  Direct current cardioversion on 11/15/2018 by Dr Meda Coffee which successfully restored SR.  There were no early apparent complications.     Brief HPI: Patty [name] is a 85 y.o. female with PMHx including above, sought medical attention with increasing SOB, DOE, an element of chest pressure.  She was found in AFib, CHF exacerbation, her HS trop abnormal and admitted for further care and management  Hospital Course:  The patient was admitted and started on IV diuresis.  She underwent LHC without intervention, (details noted above and in procedure report).  NSTEMI felt to be demand ischemia 2/2 to RVR.  Cardiology recommended to continue clopidogrel, atorvastatin, Imdur, she was transitioned to Toprol given worsened CM.  She got dizzy with Delene Loll, continued on lasix and aldactone,  recommended consideration for low dose ARB once back in SR. TTE noted her LVEF not as low as cath, with EF 40-45%  She was noted to have worsened LVEF, her AFib rate poorly controlled rates despite aggressive attempts at rate control.  Symptomatic with the fast rates with chest pressure.  EP  was consulted to her case and decided to attempt rhythm control with Tikosyn (having historically been intolerant to amiodarone).  She was diuresed and was transitioned to PO diuretics. Tikosyn was initiated.  Renal function and electrolytes were followed during the hospitalization.  her QTc remained stable (in the environment of paced QRS duration also has baseline LBBB when not paced).  On 11/15/2018 she  underwent direct current cardioversion which restored sinus rhythm, unfortunately she has ERAF/an atach though much better rate controlled, often V pacing.  She was monitored until discharge on telemetry which demonstrated AFlutter/Atachycardia 70's-110s, mostly 70's-80's.  On the day of discharge, she feels well, no CP no SOB, she was examined by Dr [name] who considered her stable for discharge to home.  Follow-up has been arranged with the Afib clinic in 1 week and in 4 weeks.   Creat at baseline looks 1.0 - 1.1, she is a little higher from that currently though trending down.  At her AFib clinic next week, if her Creat is stable would have low dose ARB added on to her regime.  At time of discharge the patient inquires about home PT.  She at baseline walks with a cane or walker.  She has ambulated here in her room yesterday and today, and feels safe and able to go home, but feels like she may re strengthen better/faster with PT.  She is observed to ambulate in the hall with a walker without difficulty or instability.  I offered a formal PT evaluation for their recommendations but she would prefer not to wait on that.  In d/w case manager, we can have PT evaluation done at home for any home needs.  The patient again states she feels safe and ready to go home.  She has coumadin clinic f/u in place, no changes were made to her home regime given she came in therapeutic  Physical Exam: Vitals:   11/16/18 0800 11/16/18 1043 11/16/18 1049 11/16/18 1135  BP:  (!) 130/49 (!) 129/109 130/71  Pulse:  94 83   Resp: 19     Temp:  97.8 F (36.6 C)    TempSrc:  Oral    SpO2: 97% 95%    Weight:      Height:        GEN- The patient is well appearing, alert and oriented x 3 today.   HEENT: normocephalic, atraumatic; sclera clear, conjunctiva pink; hearing intact; oropharynx clear; neck supple, no JVP Lymph- no cervical lymphadenopathy Lungs- CTA b/l, normal work of breathing.  No wheezes, rales,  rhonchi Heart- irreg-irreg, soft SM, no rubs or gallops, PMI not laterally displaced GI- soft, non-tender, non-distended Extremities- no clubbing, cyanosis, or edema MS- no significant deformity or atrophy Skin- warm and dry, no rash or lesion Psych- euthymic mood, full affect Neuro- strength and sensation are intact   Labs:   Lab Results  Component Value Date   WBC 6.0 11/14/2018   HGB 13.3 11/14/2018   HCT 42.2 11/14/2018   MCV 102.4 (H) 11/14/2018   PLT 212 11/14/2018    Recent Labs  Lab 11/16/18 0436  NA 142  K 4.3  CL 106  CO2 25  BUN 30*  CREATININE 1.21*  CALCIUM 8.9  GLUCOSE 72     Discharge Medications:  Allergies as of 11/16/2018      Reactions   Bee Venom Anaphylaxis   Other Nausea And Vomiting, Other (See Comments)   Pain medications cause severe vomiting. Tolerated slow IV morphine drip  Oxycodone Hcl Nausea And Vomiting   Amiodarone Nausea Only   Prednisone Palpitations, Other (See Comments)   "Rapid Heart Beat"      Medication List    STOP taking these medications   diltiazem 120 MG 24 hr capsule Commonly known as: CARDIZEM CD   diltiazem 240 MG 24 hr capsule Commonly known as: CARDIZEM CD   metoprolol tartrate 100 MG tablet Commonly known as: LOPRESSOR     TAKE these medications   acetaminophen 500 MG tablet Commonly known as: TYLENOL Take 500 mg by mouth every 6 (six) hours as needed for mild pain or headache.   atorvastatin 80 MG tablet Commonly known as: LIPITOR TAKE 1 TABLET BY MOUTH EVERY DAY IN THE MORNING What changed: See the new instructions.   beta carotene w/minerals tablet Take 1 tablet by mouth 2 (two) times daily.   Caltrate 600+D 600-800 MG-UNIT Tabs Generic drug: Calcium Carb-Cholecalciferol Take 1 tablet by mouth every evening.   clopidogrel 75 MG tablet Commonly known as: PLAVIX Take 1 tablet (75 mg total) by mouth daily.   Coenzyme Q-10 100 MG capsule Take 200 mg by mouth at bedtime.   docusate  sodium 100 MG capsule Commonly known as: COLACE Take 100 mg by mouth daily as needed for mild constipation (IF NO B/M AFTER 2 DAYS).   dofetilide 125 MCG capsule Commonly known as: TIKOSYN Take 1 capsule (125 mcg total) by mouth 2 (two) times daily.   Fish Oil 1200 MG Caps Take 1,200 mg by mouth daily.   furosemide 40 MG tablet Commonly known as: LASIX 2 tabs by mouth every morning. 2 additional tabs daily as needed for wt gain/abdominal distension. What changed:   how much to take  how to take this  when to take this  additional instructions   gabapentin 300 MG capsule Commonly known as: NEURONTIN TAKE 1 CAPSULE BY MOUTH TWICE A DAY   isosorbide mononitrate 60 MG 24 hr tablet Commonly known as: IMDUR Take 1 tablet (60 mg total) by mouth daily.   Klor-Con M20 20 MEQ tablet Generic drug: potassium chloride SA TAKE 1 TABLET BY MOUTH EVERY DAY What changed:   how much to take  when to take this   lidocaine-prilocaine cream Commonly known as: EMLA Apply to PAC site 1-2 hours prior to access   metoprolol 200 MG 24 hr tablet Commonly known as: TOPROL-XL Take 1 tablet (200 mg total) by mouth daily. Take with or immediately following a meal. Start taking on: November 17, 2018   multivitamin capsule Take 1 capsule by mouth daily.   nitroGLYCERIN 0.4 MG SL tablet Commonly known as: NITROSTAT PLACE 1 TABLET (0.4 MG TOTAL) UNDER THE TONGUE EVERY 5 (FIVE) MINUTES AS NEEDED FOR CHEST PAIN What changed: See the new instructions.   spironolactone 25 MG tablet Commonly known as: ALDACTONE Take 25 mg by mouth See admin instructions. Take 25 mg by mouth every other day at 4 PM   tamoxifen 10 MG tablet Commonly known as: NOLVADEX Take 1 tablet (10 mg total) by mouth daily.   warfarin 2 MG tablet Commonly known as: COUMADIN Take as directed. If you are unsure how to take this medication, talk to your nurse or doctor. Original instructions: TAKE AS DIRECTED BY COUMADIN  CLINIC. What changed:   how much to take  how to take this  when to take this  additional instructions       Disposition:  Home   Discharge Instructions    Diet -  low sodium heart healthy   Complete by: As directed    Increase activity slowly   Complete by: As directed      Follow-up Information    [facility] up.   Specialty: Cardiology Why: 11/23/2018 @ 9:30AM 12/17/2018 @11 :00AM Contact information: [address] [id number] mc [address] [place] [phone]       [facility].   Specialty: Cardiology Why: 11/18/2018 @ 1;00PM, coumadin clinic Contact information: [address] [place] [phone]          Duration of Discharge Encounter: Greater than 30 minutes including physician time.  Signed, Tommye Standard, PA-C 11/16/2018 12:55 PM  I have seen, examined the patient, and reviewed the above assessment and plan.  Changes to above are made where necessary.  On exam, RRR.  Plan discharge today with follow up next week in AF clinic.  Options if recurrent AF include amiodarone (caused nausea in the past) or AVN ablation and CRTP upgrade.   Co Sign: Thompson Grayer, MD 11/16/2018 5:32 PM

## 2018-11-16 NOTE — Progress Notes (Signed)
Pharmacy: Dofetilide (Tikosyn) - Follow Up Assessment and Electrolyte Replacement  Pharmacy consulted to assist in monitoring and replacing electrolytes in this 85 y.o. female admitted on 11/09/2018 undergoing dofetilide initiation. First dofetilide dose: 125 mcg on 10/8 PM.   Labs:    Component Value Date/Time   K 4.3 11/16/2018 0436   K 4.2 12/14/2014 1324   MG 2.2 11/16/2018 0436     Plan: Potassium: K >/= 4: No additional supplementation needed  Magnesium: Mg > 2: No additional supplementation needed   Thank you for allowing pharmacy to participate in this patient's care   Arrie Senate, PharmD, BCPS Clinical Pharmacist [phone] Please check AMION for all [place] numbers 11/16/2018

## 2018-11-16 NOTE — Progress Notes (Signed)
[facility] for Warfarin Indication: chest pain/ACS  Allergies  Allergen Reactions  . Bee Venom Anaphylaxis  . Other Nausea And Vomiting and Other (See Comments)    Pain medications cause severe vomiting. Tolerated slow IV morphine drip  . Oxycodone Hcl Nausea And Vomiting  . Amiodarone Nausea Only  . Prednisone Palpitations and Other (See Comments)    "Rapid Heart Beat"    Patient Measurements: Height: 5' (152.4 cm) Weight: 167 lb 8.8 oz (76 kg) IBW/kg (Calculated) : 45.5 Heparin Dosing Weight: 63 kg  Vital Signs: Temp: 97.9 F (36.6 C) (10/13 0751) Temp Source: Oral (10/13 0751) BP: 111/79 (10/13 0751) Pulse Rate: 71 (10/12 2300)  Labs: Recent Labs    11/14/18 0532 11/15/18 0408 11/15/18 1115 11/16/18 0436  HGB 13.3  --   --   --   HCT 42.2  --   --   --   PLT 212  --   --   --   LABPROT 23.6* 26.8*  --  29.2*  INR 2.1* 2.5*  --  2.8*  CREATININE 1.32*  --  1.23* 1.21*    Estimated Creatinine Clearance: 30.4 mL/min (A) (by C-G formula based on SCr of 1.21 mg/dL (H)).   Assessment: Patient is a 85 year old female who presents to the ED for evaluation of chest pain. She is noted with NSTEMI s/p cath on heparin (on warfarin PTA for afib). Heparin continued while INR subtherapeutic, now off and INR remains within goal range at 2.8 but is trending up.  **PTA dose = 1mg  MWF, 2mg  TTSS  Goal of Therapy:  INR goal 2-3 Monitor platelets by anticoagulation protocol: Yes   Plan:  -Warfarin 1mg  PO x1 tonight -Daily protime   Arrie Senate, PharmD, BCPS Clinical Pharmacist [phone] Please check AMION for all [place] numbers 11/16/2018

## 2018-11-16 NOTE — Plan of Care (Signed)
  Problem: Education: Goal: Understanding of CV disease, CV risk reduction, and recovery process will improve Outcome: Progressing Goal: Individualized Educational Video(s) Outcome: Progressing   Problem: Activity: Goal: Ability to return to baseline activity level will improve Outcome: Progressing   Problem: Cardiovascular: Goal: Ability to achieve and maintain adequate cardiovascular perfusion will improve Outcome: Progressing Goal: Vascular access site(s) Level 0-1 will be maintained Outcome: Progressing   Problem: Health Behavior/Discharge Planning: Goal: Ability to safely manage health-related needs after discharge will improve Outcome: Progressing   Problem: Education: Goal: Knowledge of disease or condition will improve Outcome: Progressing Goal: Understanding of medication regimen will improve Outcome: Progressing Goal: Individualized Educational Video(s) Outcome: Progressing   Problem: Activity: Goal: Ability to tolerate increased activity will improve Outcome: Progressing   Problem: Cardiac: Goal: Ability to achieve and maintain adequate cardiopulmonary perfusion will improve Outcome: Progressing   Problem: Health Behavior/Discharge Planning: Goal: Ability to safely manage health-related needs after discharge will improve Outcome: Progressing

## 2018-11-16 NOTE — TOC Initial Note (Signed)
Transition of Care [facility]) - Initial/Assessment Note    Patient Details  Name: Patty [name] MRN: [id number] Date of Birth: May 24, 1930  [facility]) CM/SW Contact:    Alberteen Sam, [place] Phone Number: [phone] 11/16/2018, 5:11 PM  Clinical Narrative:                  Patient is set up with [facility] PT[facility]OT for evaluation. Tiffany with [facility] to call daughter Silverio Lay and discuss, [facility] will be at home on Thursday 10/15.   Expected Discharge Plan: [place] Barriers to Discharge: No Barriers Identified   Patient Goals and CMS Choice   CMS Medicare.gov Compare Post Acute Care list provided to:: Patient Represenative (must comment)(Louisa) Choice offered to / list presented to : Adult Children  Expected Discharge Plan and Services Expected Discharge Plan: [place]       Living arrangements for the past 2 months: Single Family Home Expected Discharge Date: 11/16/18                         HH Arranged: OT, PT [place] Agency: [facility] (formerly [employer]) Date [place]: 11/16/18 Time [place]: 41 Representative spoke with at [place]: [place] Arrangements/Services Living arrangements for the past 2 months: [place] Lives with:: Self Patient language and need for interpreter reviewed:: Yes Do you feel safe going back to the place where you live?: Yes      Need for Family Participation in Patient Care: Yes (Comment) Care giver support system in place?: Yes (comment)   Criminal Activity/Legal Involvement Pertinent to Current Situation/Hospitalization: No - Comment as needed  Activities of Daily Living Home Assistive Devices/Equipment: Cane (specify quad or straight) ADL Screening (condition at time of admission) Patient's cognitive ability adequate to safely complete daily activities?: Yes Is the patient deaf or have difficulty hearing?: No Does  the patient have difficulty seeing, even when wearing glasses/contacts?: No Does the patient have difficulty concentrating, remembering, or making decisions?: No Patient able to express need for assistance with ADLs?: Yes Does the patient have difficulty dressing or bathing?: No Independently performs ADLs?: Yes (appropriate for developmental age) Does the patient have difficulty walking or climbing stairs?: No Weakness of Legs: None Weakness of Arms/Hands: None  Permission Sought/Granted Permission sought to share information with : Case Manager, Customer service manager, Family Supports Permission granted to share information with : Yes, Verbal Permission Granted  Share Information with NAME: Silverio Lay  Permission granted to share info w AGENCY: [place] granted to share info w Relationship: daughter  Permission granted to share info w Contact Information: [phone]  Emotional Assessment Appearance:: Appears stated age Attitude/Demeanor/Rapport: Gracious Affect (typically observed): Calm Orientation: : Oriented to Self, Oriented to Place, Oriented to  Time, Oriented to Situation Alcohol / Substance Use: Not Applicable Psych Involvement: No (comment)  Admission diagnosis:  NSTEMI (non-ST elevated myocardial infarction) [facility]) [I21.4] Patient Active Problem List   Diagnosis Date Noted  . Stable angina ([place]) 10/09/2018  . Coagulopathy ([place]) 10/09/2018  . Acute on chronic combined systolic and diastolic CHF (congestive heart failure) ([place]) 10/08/2018  . Peripheral neuropathy due to chemotherapy ([place]) 11/19/2017  . Pancytopenia, acquired ([place]) 11/19/2017  . Acute on chronic combined systolic (congestive) and diastolic (congestive) heart failure ([place]) 11/03/2017  . Hyperglycemia 11/03/2017  . Protein-calorie malnutrition, moderate ([place]) 10/28/2017  . Other constipation  10/20/2017  . Postherpetic neuralgia 10/20/2017  . Cancer associated pain 10/20/2017  .  Physical debility 10/20/2017  . Goals of care, counseling/discussion 10/20/2017  . Ovarian cancer ([place]) 10/19/2017  . Intraperitoneal hemorrhage   . Abdominal pain 10/12/2017  . Pain of joint of left ankle and foot 08/03/2017  . Persistent atrial fibrillation   . Warfarin anticoagulation   . DOE (dyspnea on exertion)   . Status post coronary artery stent placement   . Normocytic anemia 07/11/2016  . History of small bowel obstruction 12/22/2015  . Permanent atrial fibrillation with RVR 12/15/2015  . Demand ischemia ([place]) 12/15/2015  . NSTEMI (non-ST elevated myocardial infarction) ([place]) 05/18/2015  . Chronic anticoagulation - coumadin, CHADS2VASC=6 05/17/2015  . Unstable angina ([place]) 05/17/2015  . Swelling of lower extremity 04/18/2015  . Encounter for therapeutic drug monitoring 08/25/2014  . 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 Ischemic chest pain ([place])   . OSA on CPAP 05/14/2014  . Chest pain 01/05/2014  . Atrial flutter ([place])   . Hypokalemia   . Pacemaker - St Jude Leadless PPM   . Combined systolic and diastolic heart failure, NYHA class 3 ([place])   . Tachycardia-bradycardia syndrome ([place]) 09/30/2012  . Atrial fibrillation with RVR ([place]) 12/28/2011  . Ventral hernia 10/16/2011  . Granulosa cell tumor of ovary 04/23/2011  . CAD (coronary artery disease) 10/03/2010  . Essential hypertension 10/03/2010  . Hyperlipidemia 10/03/2010  . Renal artery stenosis ([place]) 10/03/2010  . Obesity 10/03/2010   PCP:  [name], MD Pharmacy:   [id number] - [place], [name] [address] [place] [address] Phone: [phone] Fax: [place], [place] [address] [place] [address] Phone: [phone] Fax: [phone]     Social Determinants of Health (SDOH) Interventions    Readmission Risk Interventions No flowsheet data  found.

## 2018-11-16 NOTE — Consult Note (Signed)
   [facility] CM Inpatient Consult   11/16/2018  Ellizabeth Nuttle Defilippo 1930/12/22 [id number]   High risk Medicare ACO  Patient assigned General EMMI follow up.  Natividad Brood, RN BSN [facility] Liaison  [phone] business mobile phone Toll free office [phone]  Fax number: [phone] [email or web address]

## 2018-11-16 NOTE — Telephone Encounter (Signed)
Called pt, but the VM was full, could not leave a message, but pt is currently admitted in the hospital.

## 2018-11-16 NOTE — Discharge Instructions (Signed)

## 2018-11-16 NOTE — Plan of Care (Signed)
  Problem: Education: Goal: Understanding of CV disease, CV risk reduction, and recovery process will improve 11/16/2018 1450 by Shanon Ace, RN Outcome: Adequate for Discharge 11/16/2018 0746 by Shanon Ace, RN Outcome: Progressing Goal: Individualized Educational Video(s) 11/16/2018 1450 by Shanon Ace, RN Outcome: Adequate for Discharge 11/16/2018 0746 by Shanon Ace, RN Outcome: Progressing   Problem: Activity: Goal: Ability to return to baseline activity level will improve 11/16/2018 1450 by Shanon Ace, RN Outcome: Adequate for Discharge 11/16/2018 0746 by Shanon Ace, RN Outcome: Progressing   Problem: Cardiovascular: Goal: Ability to achieve and maintain adequate cardiovascular perfusion will improve 11/16/2018 1450 by Shanon Ace, RN Outcome: Adequate for Discharge 11/16/2018 0746 by Shanon Ace, RN Outcome: Progressing Goal: Vascular access site(s) Level 0-1 will be maintained 11/16/2018 1450 by Shanon Ace, RN Outcome: Adequate for Discharge 11/16/2018 0746 by Shanon Ace, RN Outcome: Progressing   Problem: Health Behavior/Discharge Planning: Goal: Ability to safely manage health-related needs after discharge will improve 11/16/2018 1450 by Shanon Ace, RN Outcome: Adequate for Discharge 11/16/2018 0746 by Shanon Ace, RN Outcome: Progressing   Problem: Education: Goal: Knowledge of disease or condition will improve 11/16/2018 1450 by Shanon Ace, RN Outcome: Adequate for Discharge 11/16/2018 0746 by Shanon Ace, RN Outcome: Progressing Goal: Understanding of medication regimen will improve 11/16/2018 1450 by Shanon Ace, RN Outcome: Adequate for Discharge 11/16/2018 0746 by Shanon Ace, RN Outcome: Progressing Goal: Individualized Educational Video(s) 11/16/2018 1450 by Shanon Ace, RN Outcome: Adequate for Discharge 11/16/2018 0746 by Shanon Ace, RN Outcome: Progressing   Problem: Activity: Goal: Ability to  tolerate increased activity will improve 11/16/2018 1450 by Shanon Ace, RN Outcome: Adequate for Discharge 11/16/2018 0746 by Shanon Ace, RN Outcome: Progressing   Problem: Cardiac: Goal: Ability to achieve and maintain adequate cardiopulmonary perfusion will improve 11/16/2018 1450 by Shanon Ace, RN Outcome: Adequate for Discharge 11/16/2018 0746 by Shanon Ace, RN Outcome: Progressing   Problem: Health Behavior/Discharge Planning: Goal: Ability to safely manage health-related needs after discharge will improve 11/16/2018 1450 by Shanon Ace, RN Outcome: Adequate for Discharge 11/16/2018 0746 by Shanon Ace, RN Outcome: Progressing

## 2018-11-18 ENCOUNTER — Other Ambulatory Visit: Payer: Self-pay

## 2018-11-18 ENCOUNTER — Ambulatory Visit (INDEPENDENT_AMBULATORY_CARE_PROVIDER_SITE_OTHER): Payer: Medicare Other | Admitting: Pharmacist

## 2018-11-18 DIAGNOSIS — Z5181 Encounter for therapeutic drug level monitoring: Secondary | ICD-10-CM | POA: Diagnosis not present

## 2018-11-18 DIAGNOSIS — I4891 Unspecified atrial fibrillation: Secondary | ICD-10-CM | POA: Diagnosis not present

## 2018-11-18 LAB — POCT INR: INR: 3.1 — AB (ref 2.0–3.0)

## 2018-11-18 NOTE — Patient Outreach (Signed)
Red Emmi:  New referral for red emmi:   Alerts/ red flags were:  Questions about discharge.  Reviewed medical record and discharge note. Placed call to patient with no answer. Left a message requesting a call back.  4:50pm  Placed 2nd call to patient who answered. Reports she does not have any questions about her discharge instructions. Reports she went to the INR clinic today.  Patient reports she is following her directions and is taking her medications as prescribed.   PLAN: denies needs. Will close case. Encouraged patient to keep up the good work.  Tomasa Rand, RN, BSN, CEN [facility] [employer] [phone]

## 2018-11-18 NOTE — Patient Instructions (Signed)
10/15: Take 1/2 tablet today , continue taking 1 tablet daily except 1/2 tablet on Mondays, Wednesdays and Fridays. Recheck INR in 2 weeks-Taking Tamoxifen-which can increase INR. Main [phone]. Coumadin Clinic # [phone]

## 2018-11-23 ENCOUNTER — Other Ambulatory Visit: Payer: Self-pay

## 2018-11-23 ENCOUNTER — Encounter (HOSPITAL_COMMUNITY): Payer: Self-pay | Admitting: Nurse Practitioner

## 2018-11-23 ENCOUNTER — Ambulatory Visit (HOSPITAL_COMMUNITY)
Admission: RE | Admit: 2018-11-23 | Discharge: 2018-11-23 | Disposition: A | Discharge status: Home / Self Care | Payer: Medicare Other | Source: Ambulatory Visit | Attending: Nurse Practitioner | Admitting: Nurse Practitioner

## 2018-11-23 ENCOUNTER — Other Ambulatory Visit (HOSPITAL_COMMUNITY): Payer: Self-pay | Admitting: *Deleted

## 2018-11-23 VITALS — BP 142/40 | HR 60 | Ht 60.0 in | Wt 173.6 lb

## 2018-11-23 DIAGNOSIS — I739 Peripheral vascular disease, unspecified: Secondary | ICD-10-CM | POA: Insufficient documentation

## 2018-11-23 DIAGNOSIS — Z950 Presence of cardiac pacemaker: Secondary | ICD-10-CM | POA: Insufficient documentation

## 2018-11-23 DIAGNOSIS — Z961 Presence of intraocular lens: Secondary | ICD-10-CM | POA: Diagnosis not present

## 2018-11-23 DIAGNOSIS — I495 Sick sinus syndrome: Secondary | ICD-10-CM | POA: Insufficient documentation

## 2018-11-23 DIAGNOSIS — Z823 Family history of stroke: Secondary | ICD-10-CM | POA: Diagnosis not present

## 2018-11-23 DIAGNOSIS — H353112 Nonexudative age-related macular degeneration, right eye, intermediate dry stage: Secondary | ICD-10-CM | POA: Diagnosis not present

## 2018-11-23 DIAGNOSIS — Z885 Allergy status to narcotic agent status: Secondary | ICD-10-CM | POA: Insufficient documentation

## 2018-11-23 DIAGNOSIS — G4733 Obstructive sleep apnea (adult) (pediatric): Secondary | ICD-10-CM | POA: Diagnosis not present

## 2018-11-23 DIAGNOSIS — I5042 Chronic combined systolic (congestive) and diastolic (congestive) heart failure: Secondary | ICD-10-CM | POA: Diagnosis not present

## 2018-11-23 DIAGNOSIS — Z888 Allergy status to other drugs, medicaments and biological substances status: Secondary | ICD-10-CM | POA: Insufficient documentation

## 2018-11-23 DIAGNOSIS — I4891 Unspecified atrial fibrillation: Secondary | ICD-10-CM

## 2018-11-23 DIAGNOSIS — N183 Chronic kidney disease, stage 3 unspecified: Secondary | ICD-10-CM | POA: Insufficient documentation

## 2018-11-23 DIAGNOSIS — I252 Old myocardial infarction: Secondary | ICD-10-CM | POA: Diagnosis not present

## 2018-11-23 DIAGNOSIS — Z8249 Family history of ischemic heart disease and other diseases of the circulatory system: Secondary | ICD-10-CM | POA: Insufficient documentation

## 2018-11-23 DIAGNOSIS — I4821 Permanent atrial fibrillation: Secondary | ICD-10-CM | POA: Diagnosis not present

## 2018-11-23 DIAGNOSIS — Z79899 Other long term (current) drug therapy: Secondary | ICD-10-CM | POA: Diagnosis not present

## 2018-11-23 DIAGNOSIS — I4819 Other persistent atrial fibrillation: Secondary | ICD-10-CM

## 2018-11-23 DIAGNOSIS — Z7901 Long term (current) use of anticoagulants: Secondary | ICD-10-CM | POA: Diagnosis not present

## 2018-11-23 DIAGNOSIS — I2510 Atherosclerotic heart disease of native coronary artery without angina pectoris: Secondary | ICD-10-CM | POA: Diagnosis not present

## 2018-11-23 DIAGNOSIS — H35363 Drusen (degenerative) of macula, bilateral: Secondary | ICD-10-CM | POA: Diagnosis not present

## 2018-11-23 DIAGNOSIS — Z87891 Personal history of nicotine dependence: Secondary | ICD-10-CM | POA: Insufficient documentation

## 2018-11-23 DIAGNOSIS — E785 Hyperlipidemia, unspecified: Secondary | ICD-10-CM | POA: Diagnosis not present

## 2018-11-23 DIAGNOSIS — I130 Hypertensive heart and chronic kidney disease with heart failure and stage 1 through stage 4 chronic kidney disease, or unspecified chronic kidney disease: Secondary | ICD-10-CM | POA: Diagnosis not present

## 2018-11-23 DIAGNOSIS — I447 Left bundle-branch block, unspecified: Secondary | ICD-10-CM | POA: Diagnosis not present

## 2018-11-23 DIAGNOSIS — H31011 Macula scars of posterior pole (postinflammatory) (post-traumatic), right eye: Secondary | ICD-10-CM | POA: Diagnosis not present

## 2018-11-23 LAB — BASIC METABOLIC PANEL
Anion gap: 8 (ref 5–15)
BUN: 17 mg/dL (ref 8–23)
CO2: 28 mmol/L (ref 22–32)
Calcium: 8.5 mg/dL — ABNORMAL LOW (ref 8.9–10.3)
Chloride: 106 mmol/L (ref 98–111)
Creatinine, Ser: 1.24 mg/dL — ABNORMAL HIGH (ref 0.44–1.00)
GFR calc Af Amer: 46 mL/min — ABNORMAL LOW (ref >60)
GFR calc non Af Amer: 39 mL/min — ABNORMAL LOW (ref >60)
Glucose, Bld: 114 mg/dL — ABNORMAL HIGH (ref 70–99)
Potassium: 3.7 mmol/L (ref 3.5–5.1)
Sodium: 142 mmol/L (ref 135–145)

## 2018-11-23 LAB — MAGNESIUM: Magnesium: 2.3 mg/dL (ref 1.7–2.4)

## 2018-11-23 MED ORDER — LOSARTAN POTASSIUM 25 MG PO TABS: 25.0000 mg | ORAL_TABLET | Freq: Every day | ORAL | 3 refills | Status: DC

## 2018-11-23 NOTE — Patient Instructions (Signed)
Send transmission day prior to appointment

## 2018-11-23 NOTE — Progress Notes (Addendum)
Primary Care Physician: [name], MD Referring Physician: Dr. Aurea Graff [name] is a 85 y.o. female with a h/o permanent afib, CAD, HTN, HLD, LBBB, tachy-brady with w/ PPM, OSA, that presented to [facility] with afib with RVR, and HF symptoms.  She was diuresed, LHC with stable disease, NSTEMI thought to be 2/2 demand ischemia forn RVR.CCB was stopped and she was transitioned to BB for worsening EF to 40-45%. Her RVR persisted despite aggressive rate control and it was decided to start tikosyn. DCCV restored SR, but had ERAF/atial tach.   In the afib clinic today she feels improved. Fluid status stable, ekg appears to be Sinus rhythm. Taking tikosyn appropriately. Continues on warfarin with a CHA2DS2VASc score of  at least 6. She hopes to spend Thanksgiving to Christmas with her daughter/son-in-law in the [place] area.    Today, she denies symptoms of palpitations, chest pain, shortness of breath, orthopnea, PND, lower extremity edema, dizziness, presyncope, syncope, or neurologic sequela. The patient is tolerating medications without difficulties and is otherwise without complaint today.   Past Medical History:  Diagnosis Date  . Bleeding behind the abdominal cavity 10/2017  . Chronic anticoagulation - coumadin, CHADS2VASC=6 05/17/2015  . CKD (chronic kidney disease), stage III   . Combined systolic and diastolic heart failure ([place])   . Coronary artery disease    a. s/p multiple stents - stenting x 2 to the LAD and x 1 to the LCx in 2000, rotational arthrectomy to proximal LCx and DES to LAD in 2014 and DES to LCx in 2014, along with DES to LAD for re-in-stent stenosis in 2015, and DES to ostial LAD in 2016. b. 07/2016 - orbital atherectomy & DES to mid LCx.  . Diverticulitis   . Granulosa cell carcinoma ([place])    abd; last episode was in 2009  . Hyperlipidemia   . Hypertension   . LBBB (left bundle branch block)   . Myocardial infarction ([place]) 2002  . Obesity   . OSA on CPAP    . Ovarian ca ([place]) 2019  . Pacemaker failure    a. Prior leadless PPM with premature battery failure, being managed conservatively without replacement.  . Peripheral vascular disease ([place])   . Permanent atrial fibrillation ([place]) 2013  . Renal artery stenosis ([place])   . Tachycardia-bradycardia syndrome ([place])    a. s/p leadless pacemaker ([name]) implanted by Dr [name]  . Varicose veins    Past Surgical History:  Procedure Laterality Date  . ABDOMINAL HYSTERECTOMY    . CARDIAC CATHETERIZATION  09/03/2007   EF 70%; Failed attempt at PCI to OM  . CARDIAC CATHETERIZATION  11/01/2003   EF 70%  . CARDIAC CATHETERIZATION N/A 12/14/2015   Procedure: Left Heart Cath and Coronary Angiography;  Surgeon: Burnell Blanks, MD;  Location: [place] CV LAB;  Service: Cardiovascular;  Laterality: N/A;  . CARDIOVERSION  12/31/2011   Procedure: CARDIOVERSION;  Surgeon: [name], MD;  Location: [facility] ENDOSCOPY;  Service: Cardiovascular;  Laterality: N/A;  . CARDIOVERSION N/A 12/31/2011   Procedure: CARDIOVERSION;  Surgeon: [name], MD;  Location: [facility] CATH LAB;  Service: Cardiovascular;  Laterality: N/A;  . CARDIOVERSION N/A 11/15/2018   Procedure: CARDIOVERSION;  Surgeon: Dorothy Spark, MD;  Location: [place];  Service: Cardiovascular;  Laterality: N/A;  . CATARACT EXTRACTION, BILATERAL  2015  . CHOLECYSTECTOMY  1980's  . COLON SURGERY  2004   colectomy for diverticulosis  . CORONARY ANGIOPLASTY WITH STENT PLACEMENT  2000  and 08/11/2012; 11/12/2012: 3 + 2 LAD & CFX; 2nd CFX stent 11/12/2012  . CORONARY ATHERECTOMY N/A 07/15/2016   Procedure: Coronary Atherectomy;  Surgeon: Martinique, Peter M, MD;  Location: [place] CV LAB;  Service: Cardiovascular;  Laterality: N/A;  . CORONARY BALLOON ANGIOPLASTY N/A 07/14/2016   Procedure: Coronary Balloon Angioplasty;  Surgeon: Martinique, Peter M, MD;  Location: [place] CV LAB;  Service: Cardiovascular;  Laterality: N/A;  .  CORONARY STENT INTERVENTION N/A 07/15/2016   Procedure: Coronary Stent Intervention;  Surgeon: Martinique, Peter M, MD;  Location: [place] CV LAB;  Service: Cardiovascular;  Laterality: N/A;  . FRACTIONAL FLOW RESERVE WIRE  10/07/2013   Procedure: [place];  Surgeon: [name], MD;  Location: [facility] CATH LAB;  Service: Cardiovascular;;  . HERNIA REPAIR  2005   "laparoscopic"  . IR IMAGING GUIDED PORT INSERTION  10/28/2017  . LEFT HEART CATH AND CORONARY ANGIOGRAPHY N/A 07/14/2016   Procedure: Left Heart Cath and Coronary Angiography;  Surgeon: Martinique, Peter M, MD;  Location: [place] CV LAB;  Service: Cardiovascular;  Laterality: N/A;  . LEFT HEART CATH AND CORONARY ANGIOGRAPHY N/A 11/10/2018   Procedure: LEFT HEART CATH AND CORONARY ANGIOGRAPHY;  Surgeon: Wellington Hampshire, MD;  Location: [place] CV LAB;  Service: Cardiovascular;  Laterality: N/A;  . LEFT HEART CATHETERIZATION WITH CORONARY ANGIOGRAM N/A 11/12/2012   Procedure: LEFT HEART CATHETERIZATION WITH CORONARY ANGIOGRAM;  Surgeon: [name], MD;  Location: [facility] CATH LAB;  Service: Cardiovascular;  Laterality: N/A;  . LEFT HEART CATHETERIZATION WITH CORONARY ANGIOGRAM N/A 10/07/2013   Procedure: LEFT HEART CATHETERIZATION WITH CORONARY ANGIOGRAM;  Surgeon: [name], MD;  Location: [facility] CATH LAB;  Service: Cardiovascular;  Laterality: N/A;  . LEFT HEART CATHETERIZATION WITH CORONARY ANGIOGRAM N/A 12/14/2013   Procedure: LEFT HEART CATHETERIZATION WITH CORONARY ANGIOGRAM;  Surgeon: Sinclair Grooms, MD;  Location: [facility] CATH LAB;  Service: Cardiovascular;  Laterality: N/A;  . LEFT HEART CATHETERIZATION WITH CORONARY ANGIOGRAM N/A 05/16/2014   Procedure: LEFT HEART CATHETERIZATION WITH CORONARY ANGIOGRAM;  Surgeon: Sherren Mocha, MD;  Location: [facility] CATH LAB;  Service: Cardiovascular;  Laterality: N/A;  . PERCUTANEOUS CORONARY INTERVENTION-BALLOON ONLY  08/04/2012   Procedure: PERCUTANEOUS CORONARY  INTERVENTION-BALLOON ONLY;  Surgeon: [name], MD;  Location: [facility] CATH LAB;  Service: Cardiovascular;;  . PERCUTANEOUS CORONARY ROTOBLATOR INTERVENTION (PCI-R) N/A 08/11/2012   Procedure: PERCUTANEOUS CORONARY ROTOBLATOR INTERVENTION (PCI-R);  Surgeon: [name], MD;  Location: [facility] CATH LAB;  Service: Cardiovascular;  Laterality: N/A;  . PERCUTANEOUS CORONARY STENT INTERVENTION (PCI-S)  10/07/2013   Procedure: PERCUTANEOUS CORONARY STENT INTERVENTION (PCI-S);  Surgeon: [name], MD;  Location: [facility] CATH LAB;  Service: Cardiovascular;;  . PERMANENT PACEMAKER INSERTION N/A 03/16/2012   [name] (SJM) leadless pacemaker (LEADLESS II STUDY PATEINT)  . SALIVARY GLAND SURGERY  2000's   "had a little lump removed; granulosa related; it was benign" (08/11/2012)  . TEE WITHOUT CARDIOVERSION  12/31/2011   Procedure: TRANSESOPHAGEAL ECHOCARDIOGRAM (TEE);  Surgeon: [name], MD;  Location: [facility] ENDOSCOPY;  Service: Cardiovascular;  Laterality: N/A;  . UMBILICAL GRANULOMA EXCISION  2000   2003; 2004; 2007: "all in my abdomen including small intestines, outside my ?uterus/etc" (08/11/2012)  . VARICOSE VEIN SURGERY Bilateral 1977    Current Outpatient Medications  Medication Sig Dispense Refill  . acetaminophen (TYLENOL) 500 MG tablet Take 500 mg by mouth every 6 (six) hours as needed for mild pain or headache.     Marland Kitchen  atorvastatin (LIPITOR) 80 MG tablet TAKE 1 TABLET BY MOUTH EVERY DAY IN THE MORNING (Patient taking differently: Take 80 mg by mouth daily. ) 90 tablet 3  . beta carotene w/minerals (OCUVITE) tablet Take 1 tablet by mouth 2 (two) times daily.     . Calcium Carb-Cholecalciferol (CALTRATE 600+D) 600-800 MG-UNIT TABS Take 1 tablet by mouth every evening.     . clopidogrel (PLAVIX) 75 MG tablet Take 1 tablet (75 mg total) by mouth daily. 90 tablet 3  . Coenzyme Q-10 100 MG capsule Take 200 mg by mouth at bedtime.     . docusate sodium (COLACE) 100 MG capsule Take 100 mg  by mouth daily as needed for mild constipation (IF NO B/M AFTER 2 DAYS).     Marland Kitchen dofetilide (TIKOSYN) 125 MCG capsule Take 1 capsule (125 mcg total) by mouth 2 (two) times daily. 60 capsule 6  . furosemide (LASIX) 40 MG tablet 2 tabs by mouth every morning. 2 additional tabs daily as needed for wt gain/abdominal distension. (Patient taking differently: Take 80 mg by mouth daily. ) 90 tablet 3  . gabapentin (NEURONTIN) 300 MG capsule TAKE 1 CAPSULE BY MOUTH TWICE A DAY (Patient taking differently: Take 300 mg by mouth 2 (two) times daily. ) 60 capsule 8  . isosorbide mononitrate (IMDUR) 60 MG 24 hr tablet Take 1 tablet (60 mg total) by mouth daily. 90 tablet 3  . KLOR-CON M20 20 MEQ tablet TAKE 1 TABLET BY MOUTH EVERY DAY (Patient taking differently: Take 20 mEq by mouth daily with supper. ) 90 tablet 1  . lidocaine-prilocaine (EMLA) cream Apply to PAC site 1-2 hours prior to access 30 g 1  . metoprolol succinate (TOPROL-XL) 200 MG 24 hr tablet Take 1 tablet (200 mg total) by mouth daily. Take with or immediately following a meal. 30 tablet 6  . Multiple Vitamin (MULTIVITAMIN) capsule Take 1 capsule by mouth daily.    . nitroGLYCERIN (NITROSTAT) 0.4 MG SL tablet PLACE 1 TABLET (0.4 MG TOTAL) UNDER THE TONGUE EVERY 5 (FIVE) MINUTES AS NEEDED FOR CHEST PAIN (Patient taking differently: Place 0.4 mg under the tongue every 5 (five) minutes as needed for chest pain. ) 75 tablet 2  . Omega-3 Fatty Acids (FISH OIL) 1200 MG CAPS Take 1,200 mg by mouth daily.     Marland Kitchen spironolactone (ALDACTONE) 25 MG tablet Take 25 mg by mouth See admin instructions. Take 25 mg by mouth every other day at 4 PM    . tamoxifen (NOLVADEX) 10 MG tablet Take 1 tablet (10 mg total) by mouth daily. 30 tablet 3  . warfarin (COUMADIN) 2 MG tablet TAKE AS DIRECTED BY COUMADIN CLINIC. (Patient taking differently: Take 1-2 mg by mouth See admin instructions. Taking 1/2 tablet on MON WED FRI and 1 1/2 tablets on Tuesday, Thursday, Saturday and  Sunday) 30 tablet 3  . losartan (COZAAR) 25 MG tablet Take 1 tablet (25 mg total) by mouth daily. 30 tablet 3   No current facility-administered medications for this encounter.     Allergies  Allergen Reactions  . Bee Venom Anaphylaxis  . Other Nausea And Vomiting and Other (See Comments)    Pain medications cause severe vomiting. Tolerated slow IV morphine drip  . Oxycodone Hcl Nausea And Vomiting  . Amiodarone Nausea Only  . Prednisone Palpitations and Other (See Comments)    "Rapid Heart Beat"    Social History   Socioeconomic History  . Marital status: Widowed    Spouse name: Not  on file  . Number of children: 4  . Years of education: Not on file  . Highest education level: Not on file  Occupational History  . Occupation: Retired Nurse, learning disability estate  Social Needs  . Financial resource strain: Not on file  . Food insecurity    Worry: Not on file    Inability: Not on file  . Transportation needs    Medical: Not on file    Non-medical: Not on file  Tobacco Use  . Smoking status: Former Smoker    Packs/day: 1.00    Years: 32.00    Pack years: 32.00    Types: Cigarettes    Quit date: 02/03/1974    Years since quitting: 44.8  . Smokeless tobacco: Never Used  Substance and Sexual Activity  . Alcohol use: Yes    Alcohol/week: 5.0 standard drinks    Types: 5 Glasses of wine per week    Comment: occ  . Drug use: No  . Sexual activity: Never  Lifestyle  . Physical activity    Days per week: Not on file    Minutes per session: Not on file  . Stress: Not on file  Relationships  . Social Herbalist on phone: Not on file    Gets together: Not on file    Attends religious service: Not on file    Active member of club or organization: Not on file    Attends meetings of clubs or organizations: Not on file    Relationship status: Not on file  . Intimate partner violence    Fear of current or ex partner: Not on file    Emotionally abused: Not on file     Physically abused: Not on file    Forced sexual activity: Not on file  Other Topics Concern  . Not on file  Social History Narrative   Lives alone.    Family History  Problem Relation Age of Onset  . Stroke Mother   . Heart attack Father   . Diabetes Father   . Hypertension Father   . Heart attack Brother   . Diabetes Brother   . Hypertension Brother   . Kidney failure Brother     ROS- All systems are reviewed and negative except as per the HPI above  Physical Exam: Vitals:   11/23/18 0941  BP: (!) 142/40  Pulse: 60  Weight: 78.7 kg  Height: 5' (1.524 m)   Wt Readings from Last 3 Encounters:  11/23/18 78.7 kg  11/16/18 76 kg  10/18/18 78.1 kg    Labs: Lab Results  Component Value Date   NA 142 11/23/2018   K 3.7 11/23/2018   CL 106 11/23/2018   CO2 28 11/23/2018   GLUCOSE 114 (H) 11/23/2018   BUN 17 11/23/2018   CREATININE 1.24 (H) 11/23/2018   CALCIUM 8.5 (L) 11/23/2018   PHOS 3.7 06/08/2010   MG 2.3 11/23/2018   Lab Results  Component Value Date   INR 3.1 (A) 11/18/2018   Lab Results  Component Value Date   CHOL 112 11/10/2018   HDL 36 (L) 11/10/2018   LDLCALC 62 11/10/2018   TRIG 69 11/10/2018     GEN- The patient is well appearing, alert and oriented x 3 today.   Head- normocephalic, atraumatic Eyes-  Sclera clear, conjunctiva pink Ears- hearing intact Oropharynx- clear Neck- supple, no JVP Lymph- no cervical lymphadenopathy Lungs- Clear to ausculation bilaterally, normal work of breathing Heart- Regular rate and  rhythm, no murmurs, rubs or gallops, PMI not laterally displaced GI- soft, NT, ND, + BS Extremities- no clubbing, cyanosis, or edema MS- no significant deformity or atrophy Skin- no rash or lesion Psych- euthymic mood, full affect Neuro- strength and sensation are intact  EKG-atrial paced at 60 bpm, pr int 212 ms, qrs int 162 ms, qtc  Appears stable when compared to hospital EKGs around 480 ms    Assessment and Plan: 1.  Afib with RVR Has been loaded on dofetilide 125 mcg and appears to be in SR Feels improved Continue dofetilide 125 mcg bid Continue metoprolol xl 200 mg a day  Bmet/mag Tiksoyn  precautions discussed  Bmet/mag drawn today   2. CHF Continue daily weights , weight stable at home per pt  Avoid salt D/C summary suggested to start low dose ARB if creatinine is normal today It appears stable so will start 25 mg losartan daily Her K+ today is at 3.7,( need to be at least 4 with Tikosyn)  with additional of arb with spironolactone hesitate to increase K+ at this point  [place]  in 7-10 days   3.PPM Per Dr. [name]    4. CAD No anginal symptoms No change in management   F/u in one month with interrogation of device  I will ask to have her seen by Dr. Clayton Bibles or his treatment team to see pt in 3-4 weeks as she hopes to go to [place] for the holidays and would like to see him after this hospitalization before she goes   Butch Penny C. Laurielle Selmon, [facility] [address] [place], [place] [address] [phone]

## 2018-11-24 ENCOUNTER — Telehealth: Payer: Self-pay | Admitting: Interventional Cardiology

## 2018-11-24 NOTE — Telephone Encounter (Addendum)
Appointment scheduled for 11/6 Phylliss Bob    ----- Message from Hinda Kehr, [place] sent at 11/23/2018 10:24 AM EDT ----- Regarding: Appointment with Irish Lack Patient needs appointment with Dr. Irish Lack and/or care team in 3-4 weeks per [facility] F/U per Roderic Palau, NP.  Thank  [facility]

## 2018-11-29 DIAGNOSIS — I214 Non-ST elevation (NSTEMI) myocardial infarction: Secondary | ICD-10-CM | POA: Diagnosis not present

## 2018-11-29 DIAGNOSIS — E785 Hyperlipidemia, unspecified: Secondary | ICD-10-CM | POA: Diagnosis not present

## 2018-11-29 DIAGNOSIS — N183 Chronic kidney disease, stage 3 unspecified: Secondary | ICD-10-CM | POA: Diagnosis not present

## 2018-11-29 DIAGNOSIS — I739 Peripheral vascular disease, unspecified: Secondary | ICD-10-CM | POA: Diagnosis not present

## 2018-11-29 DIAGNOSIS — K5909 Other constipation: Secondary | ICD-10-CM | POA: Diagnosis not present

## 2018-11-29 DIAGNOSIS — I272 Pulmonary hypertension, unspecified: Secondary | ICD-10-CM | POA: Diagnosis not present

## 2018-11-29 DIAGNOSIS — C569 Malignant neoplasm of unspecified ovary: Secondary | ICD-10-CM | POA: Diagnosis not present

## 2018-11-29 DIAGNOSIS — I080 Rheumatic disorders of both mitral and aortic valves: Secondary | ICD-10-CM | POA: Diagnosis not present

## 2018-11-29 DIAGNOSIS — D631 Anemia in chronic kidney disease: Secondary | ICD-10-CM | POA: Diagnosis not present

## 2018-11-29 DIAGNOSIS — I4892 Unspecified atrial flutter: Secondary | ICD-10-CM | POA: Diagnosis not present

## 2018-11-29 DIAGNOSIS — I447 Left bundle-branch block, unspecified: Secondary | ICD-10-CM | POA: Diagnosis not present

## 2018-11-29 DIAGNOSIS — I110 Hypertensive heart disease with heart failure: Secondary | ICD-10-CM | POA: Diagnosis not present

## 2018-11-29 DIAGNOSIS — B0229 Other postherpetic nervous system involvement: Secondary | ICD-10-CM | POA: Diagnosis not present

## 2018-11-29 DIAGNOSIS — I2510 Atherosclerotic heart disease of native coronary artery without angina pectoris: Secondary | ICD-10-CM | POA: Diagnosis not present

## 2018-11-29 DIAGNOSIS — G4733 Obstructive sleep apnea (adult) (pediatric): Secondary | ICD-10-CM | POA: Diagnosis not present

## 2018-11-29 DIAGNOSIS — E440 Moderate protein-calorie malnutrition: Secondary | ICD-10-CM | POA: Diagnosis not present

## 2018-11-29 DIAGNOSIS — G620 Drug-induced polyneuropathy: Secondary | ICD-10-CM | POA: Diagnosis not present

## 2018-11-29 DIAGNOSIS — I495 Sick sinus syndrome: Secondary | ICD-10-CM | POA: Diagnosis not present

## 2018-11-29 DIAGNOSIS — D630 Anemia in neoplastic disease: Secondary | ICD-10-CM | POA: Diagnosis not present

## 2018-11-29 DIAGNOSIS — I5043 Acute on chronic combined systolic (congestive) and diastolic (congestive) heart failure: Secondary | ICD-10-CM | POA: Diagnosis not present

## 2018-11-29 DIAGNOSIS — D61818 Other pancytopenia: Secondary | ICD-10-CM | POA: Diagnosis not present

## 2018-11-29 DIAGNOSIS — I701 Atherosclerosis of renal artery: Secondary | ICD-10-CM | POA: Diagnosis not present

## 2018-11-29 DIAGNOSIS — I4821 Permanent atrial fibrillation: Secondary | ICD-10-CM | POA: Diagnosis not present

## 2018-11-29 DIAGNOSIS — R7303 Prediabetes: Secondary | ICD-10-CM | POA: Diagnosis not present

## 2018-11-29 DIAGNOSIS — G893 Neoplasm related pain (acute) (chronic): Secondary | ICD-10-CM | POA: Diagnosis not present

## 2018-12-01 ENCOUNTER — Telehealth (HOSPITAL_COMMUNITY): Payer: Self-pay | Admitting: *Deleted

## 2018-12-01 DIAGNOSIS — H6123 Impacted cerumen, bilateral: Secondary | ICD-10-CM | POA: Diagnosis not present

## 2018-12-01 NOTE — Telephone Encounter (Signed)
Patient notified of findings. Per Roderic Palau NP will bring in tomorrow if still out of rhythm to plan for cardioversion if symptoms persist. Pt agreeable to plan. If CP should escalate or not responsive to nitroglycerin should report to ER.

## 2018-12-01 NOTE — Telephone Encounter (Signed)
Transmission from 10/28 at 10:17 reviewed. Normal device function, lead trends stable. Presenting rhythm A-flutter/VS @ 95bpm. AT/AF burden 38% since 11/15/18, V rates controlled. Routed to D. Kayleen Memos, NP, for review.

## 2018-12-01 NOTE — Telephone Encounter (Signed)
Patient called in stating having chest pressure since yesterday - each time resolved with nitroglycerin. Pt states her HR is in the 90s (which at appt last week HR was 60) BP 117/68. = pt to send transmission to see if having AF. Will call back with transmission report when known. Pt is currently CP free.

## 2018-12-01 NOTE — Telephone Encounter (Signed)
TRANSMISSION RECEIVED

## 2018-12-02 ENCOUNTER — Other Ambulatory Visit: Payer: Self-pay

## 2018-12-02 ENCOUNTER — Ambulatory Visit (HOSPITAL_COMMUNITY)
Admission: RE | Admit: 2018-12-02 | Discharge: 2018-12-02 | Disposition: A | Discharge status: Home / Self Care | Payer: Medicare Other | Source: Ambulatory Visit | Attending: Nurse Practitioner | Admitting: Nurse Practitioner

## 2018-12-02 ENCOUNTER — Encounter (HOSPITAL_COMMUNITY): Payer: Self-pay | Admitting: Nurse Practitioner

## 2018-12-02 ENCOUNTER — Encounter: Payer: Self-pay | Admitting: Neurology

## 2018-12-02 VITALS — BP 104/60 | HR 96 | Ht 60.0 in | Wt 173.6 lb

## 2018-12-02 DIAGNOSIS — I471 Supraventricular tachycardia: Secondary | ICD-10-CM

## 2018-12-02 DIAGNOSIS — I130 Hypertensive heart and chronic kidney disease with heart failure and stage 1 through stage 4 chronic kidney disease, or unspecified chronic kidney disease: Secondary | ICD-10-CM | POA: Diagnosis not present

## 2018-12-02 DIAGNOSIS — Z79899 Other long term (current) drug therapy: Secondary | ICD-10-CM | POA: Insufficient documentation

## 2018-12-02 DIAGNOSIS — E785 Hyperlipidemia, unspecified: Secondary | ICD-10-CM | POA: Insufficient documentation

## 2018-12-02 DIAGNOSIS — Z87891 Personal history of nicotine dependence: Secondary | ICD-10-CM | POA: Diagnosis not present

## 2018-12-02 DIAGNOSIS — R0789 Other chest pain: Secondary | ICD-10-CM | POA: Insufficient documentation

## 2018-12-02 DIAGNOSIS — N183 Chronic kidney disease, stage 3 unspecified: Secondary | ICD-10-CM | POA: Diagnosis not present

## 2018-12-02 DIAGNOSIS — I447 Left bundle-branch block, unspecified: Secondary | ICD-10-CM | POA: Diagnosis not present

## 2018-12-02 DIAGNOSIS — I2510 Atherosclerotic heart disease of native coronary artery without angina pectoris: Secondary | ICD-10-CM | POA: Insufficient documentation

## 2018-12-02 DIAGNOSIS — Z8249 Family history of ischemic heart disease and other diseases of the circulatory system: Secondary | ICD-10-CM | POA: Diagnosis not present

## 2018-12-02 DIAGNOSIS — G4733 Obstructive sleep apnea (adult) (pediatric): Secondary | ICD-10-CM | POA: Insufficient documentation

## 2018-12-02 DIAGNOSIS — I110 Hypertensive heart disease with heart failure: Secondary | ICD-10-CM | POA: Insufficient documentation

## 2018-12-02 DIAGNOSIS — I4821 Permanent atrial fibrillation: Secondary | ICD-10-CM | POA: Diagnosis not present

## 2018-12-02 DIAGNOSIS — Z7901 Long term (current) use of anticoagulants: Secondary | ICD-10-CM | POA: Insufficient documentation

## 2018-12-02 DIAGNOSIS — I252 Old myocardial infarction: Secondary | ICD-10-CM | POA: Diagnosis not present

## 2018-12-02 NOTE — Progress Notes (Addendum)
Primary Care Physician: [name], MD Referring Physician: Dr. Aurea Graff E Brazzle is a 85 y.o. female with a h/o permanent afib, CAD, HTN, HLD, LBBB, tachy-brady with w/ PPM, OSA, that presented to [facility] with afib with RVR, and HF symptoms.  She was diuresed, LHC with stable disease, NSTEMI thought to be 2/2 demand ischemia forn RVR.CCB was stopped and she was transitioned to BB for worsening EF to 40-45%. Her RVR persisted despite aggressive rate control and it was decided to start tikosyn. DCCV restored SR, but had ERAF/atial tach.   In the afib clinic today she feels improved. Fluid status stable, ekg appears to be Sinus rhythm( a paced). Taking tikosyn appropriately. Continues on warfarin with a CHA2DS2VASc score of  at least 6. She hopes to spend Thanksgiving to Christmas with her daughter/son-in-law in the [place] area.   F/u in afib clinic, 10/29.  She called as she started having some chest discomfort and felt the need to take a NTG x 4 in the last 48 hours. Report sent to device clinic yesterday showed atrial flutter. She is not having chest pain today. Discussed with Dr. [name] and he suggested to try to pace terminate. Aaron Edelman with [employer] was asked to come to clinic to  do this.. It was not successful. He felt the pt was in an atrial tachycardia at atrial rate of 200 bpm. He discussed with Dr. [name] and it was decided to mode change from DDD to DDI 2/2 atrial undersensing which ultimately lead to ventricular pacing at 120 bpm( this is thought to have triggered her chest discomfort as replicated in the office today). Her lower rate was reduced to 40 BPM, which her intrinsic presently is 90 bpm.    Today, she denies symptoms of palpitations, chest pain, shortness of breath, orthopnea, PND, lower extremity edema, dizziness, presyncope, syncope, or neurologic sequela. The patient is tolerating medications without difficulties and is otherwise without complaint today.   Past  Medical History:  Diagnosis Date  . Bleeding behind the abdominal cavity 10/2017  . Chronic anticoagulation - coumadin, CHADS2VASC=6 05/17/2015  . CKD (chronic kidney disease), stage III   . Combined systolic and diastolic heart failure ([place])   . Coronary artery disease    a. s/p multiple stents - stenting x 2 to the LAD and x 1 to the LCx in 2000, rotational arthrectomy to proximal LCx and DES to LAD in 2014 and DES to LCx in 2014, along with DES to LAD for re-in-stent stenosis in 2015, and DES to ostial LAD in 2016. b. 07/2016 - orbital atherectomy & DES to mid LCx.  . Diverticulitis   . Granulosa cell carcinoma ([place])    abd; last episode was in 2009  . Hyperlipidemia   . Hypertension   . LBBB (left bundle branch block)   . Myocardial infarction ([place]) 2002  . Obesity   . OSA on CPAP   . Ovarian ca ([place]) 2019  . Pacemaker failure    a. Prior leadless PPM with premature battery failure, being managed conservatively without replacement.  . Peripheral vascular disease ([place])   . Permanent atrial fibrillation ([place]) 2013  . Renal artery stenosis ([place])   . Tachycardia-bradycardia syndrome ([place])    a. s/p leadless pacemaker ([name]) implanted by Dr [name]  . Varicose veins    Past Surgical History:  Procedure Laterality Date  . ABDOMINAL HYSTERECTOMY    . CARDIAC CATHETERIZATION  09/03/2007   EF 70%; Failed attempt at PCI  to OM  . CARDIAC CATHETERIZATION  11/01/2003   EF 70%  . CARDIAC CATHETERIZATION N/A 12/14/2015   Procedure: Left Heart Cath and Coronary Angiography;  Surgeon: Burnell Blanks, MD;  Location: [place] CV LAB;  Service: Cardiovascular;  Laterality: N/A;  . CARDIOVERSION  12/31/2011   Procedure: CARDIOVERSION;  Surgeon: [name], MD;  Location: [facility] ENDOSCOPY;  Service: Cardiovascular;  Laterality: N/A;  . CARDIOVERSION N/A 12/31/2011   Procedure: CARDIOVERSION;  Surgeon: [name], MD;  Location: [facility] CATH LAB;  Service: Cardiovascular;   Laterality: N/A;  . CARDIOVERSION N/A 11/15/2018   Procedure: CARDIOVERSION;  Surgeon: Dorothy Spark, MD;  Location: [place];  Service: Cardiovascular;  Laterality: N/A;  . CATARACT EXTRACTION, BILATERAL  2015  . CHOLECYSTECTOMY  1980's  . COLON SURGERY  2004   colectomy for diverticulosis  . CORONARY ANGIOPLASTY WITH STENT PLACEMENT  2000    and 08/11/2012; 11/12/2012: 3 + 2 LAD & CFX; 2nd CFX stent 11/12/2012  . CORONARY ATHERECTOMY N/A 07/15/2016   Procedure: Coronary Atherectomy;  Surgeon: Martinique, Peter M, MD;  Location: [place] CV LAB;  Service: Cardiovascular;  Laterality: N/A;  . CORONARY BALLOON ANGIOPLASTY N/A 07/14/2016   Procedure: Coronary Balloon Angioplasty;  Surgeon: Martinique, Peter M, MD;  Location: [place] CV LAB;  Service: Cardiovascular;  Laterality: N/A;  . CORONARY STENT INTERVENTION N/A 07/15/2016   Procedure: Coronary Stent Intervention;  Surgeon: Martinique, Peter M, MD;  Location: [place] [facility] LAB;  Service: Cardiovascular;  Laterality: N/A;  . FRACTIONAL FLOW RESERVE WIRE  10/07/2013   Procedure: [place];  Surgeon: [name], MD;  Location: [facility] CATH LAB;  Service: Cardiovascular;;  . HERNIA REPAIR  2005   "laparoscopic"  . IR IMAGING GUIDED PORT INSERTION  10/28/2017  . LEFT HEART CATH AND CORONARY ANGIOGRAPHY N/A 07/14/2016   Procedure: Left Heart Cath and Coronary Angiography;  Surgeon: Martinique, Peter M, MD;  Location: [place] [facility] LAB;  Service: Cardiovascular;  Laterality: N/A;  . LEFT HEART CATH AND CORONARY ANGIOGRAPHY N/A 11/10/2018   Procedure: LEFT HEART CATH AND CORONARY ANGIOGRAPHY;  Surgeon: Wellington Hampshire, MD;  Location: [place] CV LAB;  Service: Cardiovascular;  Laterality: N/A;  . LEFT HEART CATHETERIZATION WITH CORONARY ANGIOGRAM N/A 11/12/2012   Procedure: LEFT HEART CATHETERIZATION WITH CORONARY ANGIOGRAM;  Surgeon: [name], MD;  Location: [facility] CATH LAB;  Service: Cardiovascular;  Laterality: N/A;   . LEFT HEART CATHETERIZATION WITH CORONARY ANGIOGRAM N/A 10/07/2013   Procedure: LEFT HEART CATHETERIZATION WITH CORONARY ANGIOGRAM;  Surgeon: [name], MD;  Location: [facility] CATH LAB;  Service: Cardiovascular;  Laterality: N/A;  . LEFT HEART CATHETERIZATION WITH CORONARY ANGIOGRAM N/A 12/14/2013   Procedure: LEFT HEART CATHETERIZATION WITH CORONARY ANGIOGRAM;  Surgeon: Sinclair Grooms, MD;  Location: [facility] CATH LAB;  Service: Cardiovascular;  Laterality: N/A;  . LEFT HEART CATHETERIZATION WITH CORONARY ANGIOGRAM N/A 05/16/2014   Procedure: LEFT HEART CATHETERIZATION WITH CORONARY ANGIOGRAM;  Surgeon: Sherren Mocha, MD;  Location: [facility] CATH LAB;  Service: Cardiovascular;  Laterality: N/A;  . PERCUTANEOUS CORONARY INTERVENTION-BALLOON ONLY  08/04/2012   Procedure: PERCUTANEOUS CORONARY INTERVENTION-BALLOON ONLY;  Surgeon: [name], MD;  Location: [facility] CATH LAB;  Service: Cardiovascular;;  . PERCUTANEOUS CORONARY ROTOBLATOR INTERVENTION (PCI-R) N/A 08/11/2012   Procedure: PERCUTANEOUS CORONARY ROTOBLATOR INTERVENTION (PCI-R);  Surgeon: [name], MD;  Location: [facility] CATH LAB;  Service: Cardiovascular;  Laterality: N/A;  . PERCUTANEOUS CORONARY STENT INTERVENTION (PCI-S)  10/07/2013  Procedure: PERCUTANEOUS CORONARY STENT INTERVENTION (PCI-S);  Surgeon: [name], MD;  Location: [facility] CATH LAB;  Service: Cardiovascular;;  . PERMANENT PACEMAKER INSERTION N/A 03/16/2012   [name] (SJM) leadless pacemaker (LEADLESS II STUDY PATEINT)  . SALIVARY GLAND SURGERY  2000's   "had a little lump removed; granulosa related; it was benign" (08/11/2012)  . TEE WITHOUT CARDIOVERSION  12/31/2011   Procedure: TRANSESOPHAGEAL ECHOCARDIOGRAM (TEE);  Surgeon: [name], MD;  Location: [facility] ENDOSCOPY;  Service: Cardiovascular;  Laterality: N/A;  . UMBILICAL GRANULOMA EXCISION  2000   2003; 2004; 2007: "all in my abdomen including small intestines, outside my ?uterus/etc" (08/11/2012)  .  VARICOSE VEIN SURGERY Bilateral 1977    Current Outpatient Medications  Medication Sig Dispense Refill  . acetaminophen (TYLENOL) 500 MG tablet Take 500 mg by mouth every 6 (six) hours as needed for mild pain or headache.     Marland Kitchen atorvastatin (LIPITOR) 80 MG tablet TAKE 1 TABLET BY MOUTH EVERY DAY IN THE MORNING (Patient taking differently: Take 80 mg by mouth daily. ) 90 tablet 3  . beta carotene w/minerals (OCUVITE) tablet Take 1 tablet by mouth 2 (two) times daily.     . Calcium Carb-Cholecalciferol (CALTRATE 600+D) 600-800 MG-UNIT TABS Take 1 tablet by mouth every evening.     . clopidogrel (PLAVIX) 75 MG tablet Take 1 tablet (75 mg total) by mouth daily. 90 tablet 3  . Coenzyme Q-10 100 MG capsule Take 200 mg by mouth at bedtime.     . docusate sodium (COLACE) 100 MG capsule Take 100 mg by mouth daily as needed for mild constipation (IF NO B/M AFTER 2 DAYS).     Marland Kitchen dofetilide (TIKOSYN) 125 MCG capsule Take 1 capsule (125 mcg total) by mouth 2 (two) times daily. 60 capsule 6  . furosemide (LASIX) 40 MG tablet 2 tabs by mouth every morning. 2 additional tabs daily as needed for wt gain/abdominal distension. (Patient taking differently: Take 80 mg by mouth daily. ) 90 tablet 3  . gabapentin (NEURONTIN) 300 MG capsule TAKE 1 CAPSULE BY MOUTH TWICE A DAY (Patient taking differently: Take 300 mg by mouth 2 (two) times daily. ) 60 capsule 8  . isosorbide mononitrate (IMDUR) 60 MG 24 hr tablet Take 1 tablet (60 mg total) by mouth daily. 90 tablet 3  . KLOR-CON M20 20 MEQ tablet TAKE 1 TABLET BY MOUTH EVERY DAY (Patient taking differently: Take 20 mEq by mouth daily with supper. ) 90 tablet 1  . lidocaine-prilocaine (EMLA) cream Apply to PAC site 1-2 hours prior to access 30 g 1  . losartan (COZAAR) 25 MG tablet Take 1 tablet (25 mg total) by mouth daily. 30 tablet 3  . metoprolol succinate (TOPROL-XL) 200 MG 24 hr tablet Take 1 tablet (200 mg total) by mouth daily. Take with or immediately following a  meal. 30 tablet 6  . Multiple Vitamin (MULTIVITAMIN) capsule Take 1 capsule by mouth daily.    . nitroGLYCERIN (NITROSTAT) 0.4 MG SL tablet PLACE 1 TABLET (0.4 MG TOTAL) UNDER THE TONGUE EVERY 5 (FIVE) MINUTES AS NEEDED FOR CHEST PAIN (Patient taking differently: Place 0.4 mg under the tongue every 5 (five) minutes as needed for chest pain. ) 75 tablet 2  . Omega-3 Fatty Acids (FISH OIL) 1200 MG CAPS Take 1,200 mg by mouth daily.     Marland Kitchen spironolactone (ALDACTONE) 25 MG tablet Take 25 mg by mouth See admin instructions. Take 25 mg by mouth every other day at 4 PM    .  tamoxifen (NOLVADEX) 10 MG tablet Take 1 tablet (10 mg total) by mouth daily. 30 tablet 3  . warfarin (COUMADIN) 2 MG tablet TAKE AS DIRECTED BY COUMADIN CLINIC. (Patient taking differently: Take 1-2 mg by mouth See admin instructions. Taking 1/2 tablet on MON WED FRI and 1 1/2 tablets on Tuesday, Thursday, Saturday and Sunday) 30 tablet 3   No current facility-administered medications for this encounter.     Allergies  Allergen Reactions  . Bee Venom Anaphylaxis  . Other Nausea And Vomiting and Other (See Comments)    Pain medications cause severe vomiting. Tolerated slow IV morphine drip  . Oxycodone Hcl Nausea And Vomiting  . Amiodarone Nausea Only  . Prednisone Palpitations and Other (See Comments)    "Rapid Heart Beat"    Social History   Socioeconomic History  . Marital status: Widowed    Spouse name: Not on file  . Number of children: 4  . Years of education: Not on file  . Highest education level: Not on file  Occupational History  . Occupation: Retired Nurse, learning disability estate  Social Needs  . Financial resource strain: Not on file  . Food insecurity    Worry: Not on file    Inability: Not on file  . Transportation needs    Medical: Not on file    Non-medical: Not on file  Tobacco Use  . Smoking status: Former Smoker    Packs/day: 1.00    Years: 32.00    Pack years: 32.00    Types: Cigarettes    Quit  date: 02/03/1974    Years since quitting: 44.8  . Smokeless tobacco: Never Used  Substance and Sexual Activity  . Alcohol use: Yes    Alcohol/week: 7.0 standard drinks    Types: 7 Glasses of wine per week    Comment: one glass wine nightly with dinner  . Drug use: No  . Sexual activity: Never  Lifestyle  . Physical activity    Days per week: Not on file    Minutes per session: Not on file  . Stress: Not on file  Relationships  . Social Herbalist on phone: Not on file    Gets together: Not on file    Attends religious service: Not on file    Active member of club or organization: Not on file    Attends meetings of clubs or organizations: Not on file    Relationship status: Not on file  . Intimate partner violence    Fear of current or ex partner: Not on file    Emotionally abused: Not on file    Physically abused: Not on file    Forced sexual activity: Not on file  Other Topics Concern  . Not on file  Social History Narrative   Lives alone.    Family History  Problem Relation Age of Onset  . Stroke Mother   . Heart attack Father   . Diabetes Father   . Hypertension Father   . Heart attack Brother   . Diabetes Brother   . Hypertension Brother   . Kidney failure Brother     ROS- All systems are reviewed and negative except as per the HPI above  Physical Exam: Vitals:   12/02/18 1335  BP: 104/60  Pulse: 96  Weight: 78.7 kg  Height: 5' (1.524 m)   Wt Readings from Last 3 Encounters:  12/02/18 78.7 kg  11/23/18 78.7 kg  11/16/18 76 kg  Labs: Lab Results  Component Value Date   NA 142 11/23/2018   K 3.7 11/23/2018   CL 106 11/23/2018   CO2 28 11/23/2018   GLUCOSE 114 (H) 11/23/2018   BUN 17 11/23/2018   CREATININE 1.24 (H) 11/23/2018   CALCIUM 8.5 (L) 11/23/2018   PHOS 3.7 06/08/2010   MG 2.3 11/23/2018   Lab Results  Component Value Date   INR 3.1 (A) 11/18/2018   Lab Results  Component Value Date   CHOL 112 11/10/2018   HDL 36  (L) 11/10/2018   LDLCALC 62 11/10/2018   TRIG 69 11/10/2018     GEN- The patient is well appearing, alert and oriented x 3 today.   Head- normocephalic, atraumatic Eyes-  Sclera clear, conjunctiva pink Ears- hearing intact Oropharynx- clear Neck- supple, no JVP Lymph- no cervical lymphadenopathy Lungs- Clear to ausculation bilaterally, normal work of breathing Heart- Regular rate and rhythm, no murmurs, rubs or gallops, PMI not laterally displaced GI- soft, NT, ND, + BS Extremities- no clubbing, cyanosis, or edema MS- no significant deformity or atrophy Skin- no rash or lesion Psych- euthymic mood, full affect Neuro- strength and sensation are intact  EKG- atrail tach at 96 bpm, pr int 208 ms, qrs int 178 ms, qtc 571 ms    Assessment and Plan: 1. Atach with v rates at times reaching 120 bpm, which triggered pt's chest discomfort PPM changes made today  as described in the HPI in consultation with Dr. [name] and Aaron Edelman with [facility] No chest pain prsent  Continue dofetilide 125 mcg bid Continue metoprolol xl 200 mg a day    2. CHF Continue daily weights , weight stable at home per pt  Avoid salt  Losartan  25 mg started on last visit, BP ok today,  next visit get BMET /mag  3.PPM Per Dr. [name]   4. CAD Recent chest discomfort 2/2 to high v rates Reprogrammed  to lower v rates  No chest pain currently  Will bring back Monday with Aaron Edelman to interrogate  bmet /mag on Monday     Butch Penny C. Brandi Armato, [facility] [address] [place], [place] [address] [phone]

## 2018-12-03 ENCOUNTER — Ambulatory Visit (INDEPENDENT_AMBULATORY_CARE_PROVIDER_SITE_OTHER): Payer: Medicare Other | Admitting: Pharmacist Clinician (PhC)/ Clinical Pharmacy Specialist

## 2018-12-03 DIAGNOSIS — I4891 Unspecified atrial fibrillation: Secondary | ICD-10-CM | POA: Diagnosis not present

## 2018-12-03 DIAGNOSIS — H6123 Impacted cerumen, bilateral: Secondary | ICD-10-CM | POA: Diagnosis not present

## 2018-12-03 DIAGNOSIS — Z5181 Encounter for therapeutic drug level monitoring: Secondary | ICD-10-CM | POA: Diagnosis not present

## 2018-12-03 DIAGNOSIS — H838X3 Other specified diseases of inner ear, bilateral: Secondary | ICD-10-CM | POA: Diagnosis not present

## 2018-12-03 DIAGNOSIS — H903 Sensorineural hearing loss, bilateral: Secondary | ICD-10-CM | POA: Diagnosis not present

## 2018-12-03 LAB — POCT INR: INR: 1.7 — AB (ref 2.0–3.0)

## 2018-12-06 ENCOUNTER — Encounter (HOSPITAL_COMMUNITY): Payer: Self-pay | Admitting: Nurse Practitioner

## 2018-12-06 ENCOUNTER — Ambulatory Visit (HOSPITAL_COMMUNITY)
Admission: RE | Admit: 2018-12-06 | Discharge: 2018-12-06 | Disposition: A | Discharge status: Home / Self Care | Payer: Medicare Other | Source: Ambulatory Visit | Attending: Nurse Practitioner | Admitting: Nurse Practitioner

## 2018-12-06 ENCOUNTER — Other Ambulatory Visit: Payer: Self-pay

## 2018-12-06 VITALS — BP 110/60 | HR 94 | Ht 60.0 in | Wt 173.8 lb

## 2018-12-06 DIAGNOSIS — Z7901 Long term (current) use of anticoagulants: Secondary | ICD-10-CM | POA: Insufficient documentation

## 2018-12-06 DIAGNOSIS — N183 Chronic kidney disease, stage 3 unspecified: Secondary | ICD-10-CM | POA: Diagnosis not present

## 2018-12-06 DIAGNOSIS — Z79899 Other long term (current) drug therapy: Secondary | ICD-10-CM | POA: Diagnosis not present

## 2018-12-06 DIAGNOSIS — I471 Supraventricular tachycardia: Secondary | ICD-10-CM | POA: Diagnosis not present

## 2018-12-06 DIAGNOSIS — I2510 Atherosclerotic heart disease of native coronary artery without angina pectoris: Secondary | ICD-10-CM | POA: Diagnosis not present

## 2018-12-06 DIAGNOSIS — I5042 Chronic combined systolic (congestive) and diastolic (congestive) heart failure: Secondary | ICD-10-CM | POA: Insufficient documentation

## 2018-12-06 DIAGNOSIS — I130 Hypertensive heart and chronic kidney disease with heart failure and stage 1 through stage 4 chronic kidney disease, or unspecified chronic kidney disease: Secondary | ICD-10-CM | POA: Diagnosis not present

## 2018-12-06 DIAGNOSIS — I447 Left bundle-branch block, unspecified: Secondary | ICD-10-CM | POA: Diagnosis not present

## 2018-12-06 DIAGNOSIS — I252 Old myocardial infarction: Secondary | ICD-10-CM | POA: Diagnosis not present

## 2018-12-06 DIAGNOSIS — I4819 Other persistent atrial fibrillation: Secondary | ICD-10-CM | POA: Diagnosis not present

## 2018-12-06 DIAGNOSIS — Z7902 Long term (current) use of antithrombotics/antiplatelets: Secondary | ICD-10-CM | POA: Diagnosis not present

## 2018-12-06 DIAGNOSIS — G4733 Obstructive sleep apnea (adult) (pediatric): Secondary | ICD-10-CM | POA: Insufficient documentation

## 2018-12-06 DIAGNOSIS — Z87891 Personal history of nicotine dependence: Secondary | ICD-10-CM | POA: Insufficient documentation

## 2018-12-06 DIAGNOSIS — E785 Hyperlipidemia, unspecified: Secondary | ICD-10-CM | POA: Diagnosis not present

## 2018-12-06 LAB — BASIC METABOLIC PANEL
Anion gap: 11 (ref 5–15)
BUN: 27 mg/dL — ABNORMAL HIGH (ref 8–23)
CO2: 25 mmol/L (ref 22–32)
Calcium: 8.3 mg/dL — ABNORMAL LOW (ref 8.9–10.3)
Chloride: 103 mmol/L (ref 98–111)
Creatinine, Ser: 1.25 mg/dL — ABNORMAL HIGH (ref 0.44–1.00)
GFR calc Af Amer: 45 mL/min — ABNORMAL LOW (ref >60)
GFR calc non Af Amer: 39 mL/min — ABNORMAL LOW (ref >60)
Glucose, Bld: 106 mg/dL — ABNORMAL HIGH (ref 70–99)
Potassium: 3.5 mmol/L (ref 3.5–5.1)
Sodium: 139 mmol/L (ref 135–145)

## 2018-12-06 LAB — MAGNESIUM: Magnesium: 2 mg/dL (ref 1.7–2.4)

## 2018-12-06 NOTE — Addendum Note (Signed)
Encounter addended by: Sherran Needs, NP on: 12/06/2018 3:10 PM  Actions taken: Clinical Note Signed

## 2018-12-06 NOTE — Progress Notes (Signed)
Primary Care Physician: [name], MD Referring Physician: Dr. Aurea Graff E Panjwani is a 85 y.o. female with a h/o permanent afib, CAD, HTN, HLD, LBBB, tachy-brady with w/ PPM, OSA, that presented to [facility] with afib with RVR, and HF symptoms.  She was diuresed, LHC with stable disease, NSTEMI thought to be 2/2 demand ischemia forn RVR.CCB was stopped and she was transitioned to BB for worsening EF to 40-45%. Her RVR persisted despite aggressive rate control and it was decided to start tikosyn. DCCV restored SR, but had ERAF/atial tach.   In the afib clinic today she feels improved. Fluid status stable, ekg appears to be Sinus rhythm( a paced). Taking tikosyn appropriately. Continues on warfarin with a CHA2DS2VASc score of  at least 6. She hopes to spend Thanksgiving to Christmas with her daughter/son-in-law in the [place] area.   F/u in afib clinic, 10/29.  She called as she started having some chest discomfort and felt the need to take a NTG x 4 in the last 48 hours. Report sent to device clinic yesterday showed atrial flutter. She is not having chest pain today. Discussed with Dr. [name] and he suggested to try to pace terminate. Aaron Edelman with [employer] was asked to come to clinic to  do this.. It was not successful. He felt the pt was in an atrial tachycardia at atrial rate of 200 bpm. He discussed with Dr. [name] and it was decided to mode change from DDD to DDI 2/2 atrial undersensing which ultimately lead to ventricular pacing at 120 bpm( this is thought to have triggered her chest discomfort as replicated in the office today). Her lower rate was reduced to 40 BPM, which her intrinsic presently is 90 bpm.   F/u clinic 11/2. She is interrogated today and remains in atrial tach with v rate in the nineties. She  does not seem to be having as high of v rates as last week with 85% of time v rate in the 90's. Atrial rate around 200 bpm. She still reports some chest pressure in the am. She will  rest and take a NTG s/l and will feel a need to take another one around bedtime. She continues with her busy daytime routine in between.  I can not correlate  if chest pressure is  with higher v rates.   Today, she denies symptoms of palpitations, chest pain, shortness of breath, orthopnea, PND, lower extremity edema, dizziness, presyncope, syncope, or neurologic sequela. The patient is tolerating medications without difficulties and is otherwise without complaint today.   Past Medical History:  Diagnosis Date  . Bleeding behind the abdominal cavity 10/2017  . Chronic anticoagulation - coumadin, CHADS2VASC=6 05/17/2015  . CKD (chronic kidney disease), stage III   . Combined systolic and diastolic heart failure ([place])   . Coronary artery disease    a. s/p multiple stents - stenting x 2 to the LAD and x 1 to the LCx in 2000, rotational arthrectomy to proximal LCx and DES to LAD in 2014 and DES to LCx in 2014, along with DES to LAD for re-in-stent stenosis in 2015, and DES to ostial LAD in 2016. b. 07/2016 - orbital atherectomy & DES to mid LCx.  . Diverticulitis   . Granulosa cell carcinoma ([place])    abd; last episode was in 2009  . Hyperlipidemia   . Hypertension   . LBBB (left bundle branch block)   . Myocardial infarction ([place]) 2002  . Obesity   . OSA  on CPAP   . Ovarian ca ([place]) 2019  . Pacemaker failure    a. Prior leadless PPM with premature battery failure, being managed conservatively without replacement.  . Peripheral vascular disease ([place])   . Permanent atrial fibrillation ([place]) 2013  . Renal artery stenosis ([place])   . Tachycardia-bradycardia syndrome ([place])    a. s/p leadless pacemaker ([name]) implanted by Dr [name]  . Varicose veins    Past Surgical History:  Procedure Laterality Date  . ABDOMINAL HYSTERECTOMY    . CARDIAC CATHETERIZATION  09/03/2007   EF 70%; Failed attempt at PCI to OM  . CARDIAC CATHETERIZATION  11/01/2003   EF 70%  . CARDIAC CATHETERIZATION N/A  12/14/2015   Procedure: Left Heart Cath and Coronary Angiography;  Surgeon: Burnell Blanks, MD;  Location: [place] CV LAB;  Service: Cardiovascular;  Laterality: N/A;  . CARDIOVERSION  12/31/2011   Procedure: CARDIOVERSION;  Surgeon: [name], MD;  Location: [facility] ENDOSCOPY;  Service: Cardiovascular;  Laterality: N/A;  . CARDIOVERSION N/A 12/31/2011   Procedure: CARDIOVERSION;  Surgeon: [name], MD;  Location: [facility] CATH LAB;  Service: Cardiovascular;  Laterality: N/A;  . CARDIOVERSION N/A 11/15/2018   Procedure: CARDIOVERSION;  Surgeon: Dorothy Spark, MD;  Location: [place];  Service: Cardiovascular;  Laterality: N/A;  . CATARACT EXTRACTION, BILATERAL  2015  . CHOLECYSTECTOMY  1980's  . COLON SURGERY  2004   colectomy for diverticulosis  . CORONARY ANGIOPLASTY WITH STENT PLACEMENT  2000    and 08/11/2012; 11/12/2012: 3 + 2 LAD & CFX; 2nd CFX stent 11/12/2012  . CORONARY ATHERECTOMY N/A 07/15/2016   Procedure: Coronary Atherectomy;  Surgeon: Martinique, Peter M, MD;  Location: [place] [facility] LAB;  Service: Cardiovascular;  Laterality: N/A;  . CORONARY BALLOON ANGIOPLASTY N/A 07/14/2016   Procedure: Coronary Balloon Angioplasty;  Surgeon: Martinique, Peter M, MD;  Location: [place] [facility] LAB;  Service: Cardiovascular;  Laterality: N/A;  . CORONARY STENT INTERVENTION N/A 07/15/2016   Procedure: Coronary Stent Intervention;  Surgeon: Martinique, Peter M, MD;  Location: [place] [facility] LAB;  Service: Cardiovascular;  Laterality: N/A;  . FRACTIONAL FLOW RESERVE WIRE  10/07/2013   Procedure: [place];  Surgeon: [name], MD;  Location: [facility] C[facility] LAB;  Service: Cardiovascular;;  . HERNIA REPAIR  2005   "laparoscopic"  . IR IMAGING GUIDED PORT INSERTION  10/28/2017  . LEFT HEART CATH AND CORONARY ANGIOGRAPHY N/A 07/14/2016   Procedure: Left Heart Cath and Coronary Angiography;  Surgeon: Martinique, Peter M, MD;  Location: [place] [facility] LAB;  Service:  Cardiovascular;  Laterality: N/A;  . LEFT HEART CATH AND CORONARY ANGIOGRAPHY N/A 11/10/2018   Procedure: LEFT HEART CATH AND CORONARY ANGIOGRAPHY;  Surgeon: Wellington Hampshire, MD;  Location: [place] CV LAB;  Service: Cardiovascular;  Laterality: N/A;  . LEFT HEART CATHETERIZATION WITH CORONARY ANGIOGRAM N/A 11/12/2012   Procedure: LEFT HEART CATHETERIZATION WITH CORONARY ANGIOGRAM;  Surgeon: [name], MD;  Location: [facility] CATH LAB;  Service: Cardiovascular;  Laterality: N/A;  . LEFT HEART CATHETERIZATION WITH CORONARY ANGIOGRAM N/A 10/07/2013   Procedure: LEFT HEART CATHETERIZATION WITH CORONARY ANGIOGRAM;  Surgeon: [name], MD;  Location: [facility] CATH LAB;  Service: Cardiovascular;  Laterality: N/A;  . LEFT HEART CATHETERIZATION WITH CORONARY ANGIOGRAM N/A 12/14/2013   Procedure: LEFT HEART CATHETERIZATION WITH CORONARY ANGIOGRAM;  Surgeon: Sinclair Grooms, MD;  Location: [facility] CATH LAB;  Service: Cardiovascular;  Laterality: N/A;  . LEFT HEART CATHETERIZATION WITH CORONARY  ANGIOGRAM N/A 05/16/2014   Procedure: LEFT HEART CATHETERIZATION WITH CORONARY ANGIOGRAM;  Surgeon: Sherren Mocha, MD;  Location: [facility] CATH LAB;  Service: Cardiovascular;  Laterality: N/A;  . PERCUTANEOUS CORONARY INTERVENTION-BALLOON ONLY  08/04/2012   Procedure: PERCUTANEOUS CORONARY INTERVENTION-BALLOON ONLY;  Surgeon: [name], MD;  Location: [facility] CATH LAB;  Service: Cardiovascular;;  . PERCUTANEOUS CORONARY ROTOBLATOR INTERVENTION (PCI-R) N/A 08/11/2012   Procedure: PERCUTANEOUS CORONARY ROTOBLATOR INTERVENTION (PCI-R);  Surgeon: [name], MD;  Location: [facility] CATH LAB;  Service: Cardiovascular;  Laterality: N/A;  . PERCUTANEOUS CORONARY STENT INTERVENTION (PCI-S)  10/07/2013   Procedure: PERCUTANEOUS CORONARY STENT INTERVENTION (PCI-S);  Surgeon: [name], MD;  Location: [facility] CATH LAB;  Service: Cardiovascular;;  . PERMANENT PACEMAKER INSERTION N/A 03/16/2012   [name] (SJM) leadless  pacemaker (LEADLESS II STUDY PATEINT)  . SALIVARY GLAND SURGERY  2000's   "had a little lump removed; granulosa related; it was benign" (08/11/2012)  . TEE WITHOUT CARDIOVERSION  12/31/2011   Procedure: TRANSESOPHAGEAL ECHOCARDIOGRAM (TEE);  Surgeon: [name], MD;  Location: [facility] ENDOSCOPY;  Service: Cardiovascular;  Laterality: N/A;  . UMBILICAL GRANULOMA EXCISION  2000   2003; 2004; 2007: "all in my abdomen including small intestines, outside my ?uterus/etc" (08/11/2012)  . VARICOSE VEIN SURGERY Bilateral 1977    Current Outpatient Medications  Medication Sig Dispense Refill  . acetaminophen (TYLENOL) 500 MG tablet Take 500 mg by mouth every 6 (six) hours as needed for mild pain or headache.     Marland Kitchen atorvastatin (LIPITOR) 80 MG tablet TAKE 1 TABLET BY MOUTH EVERY DAY IN THE MORNING (Patient taking differently: Take 80 mg by mouth daily. ) 90 tablet 3  . beta carotene w/minerals (OCUVITE) tablet Take 1 tablet by mouth 2 (two) times daily.     . Calcium Carb-Cholecalciferol (CALTRATE 600+D) 600-800 MG-UNIT TABS Take 1 tablet by mouth every evening.     . clopidogrel (PLAVIX) 75 MG tablet Take 1 tablet (75 mg total) by mouth daily. 90 tablet 3  . Coenzyme Q-10 100 MG capsule Take 200 mg by mouth at bedtime.     . docusate sodium (COLACE) 100 MG capsule Take 100 mg by mouth daily as needed for mild constipation (IF NO B/M AFTER 2 DAYS).     Marland Kitchen dofetilide (TIKOSYN) 125 MCG capsule Take 1 capsule (125 mcg total) by mouth 2 (two) times daily. 60 capsule 6  . furosemide (LASIX) 40 MG tablet 2 tabs by mouth every morning. 2 additional tabs daily as needed for wt gain/abdominal distension. (Patient taking differently: Take 80 mg by mouth daily. ) 90 tablet 3  . gabapentin (NEURONTIN) 300 MG capsule TAKE 1 CAPSULE BY MOUTH TWICE A DAY (Patient taking differently: Take 300 mg by mouth 2 (two) times daily. ) 60 capsule 8  . isosorbide mononitrate (IMDUR) 60 MG 24 hr tablet Take 1 tablet (60 mg total)  by mouth daily. 90 tablet 3  . KLOR-CON M20 20 MEQ tablet TAKE 1 TABLET BY MOUTH EVERY DAY (Patient taking differently: Take 20 mEq by mouth daily with supper. ) 90 tablet 1  . lidocaine-prilocaine (EMLA) cream Apply to PAC site 1-2 hours prior to access 30 g 1  . losartan (COZAAR) 25 MG tablet Take 1 tablet (25 mg total) by mouth daily. 30 tablet 3  . metoprolol succinate (TOPROL-XL) 200 MG 24 hr tablet Take 1 tablet (200 mg total) by mouth daily. Take with or immediately following a meal. 30 tablet 6  . Multiple Vitamin (MULTIVITAMIN) capsule  Take 1 capsule by mouth daily.    . nitroGLYCERIN (NITROSTAT) 0.4 MG SL tablet PLACE 1 TABLET (0.4 MG TOTAL) UNDER THE TONGUE EVERY 5 (FIVE) MINUTES AS NEEDED FOR CHEST PAIN (Patient taking differently: Place 0.4 mg under the tongue every 5 (five) minutes as needed for chest pain. ) 75 tablet 2  . Omega-3 Fatty Acids (FISH OIL) 1200 MG CAPS Take 1,200 mg by mouth daily.     Marland Kitchen spironolactone (ALDACTONE) 25 MG tablet Take 25 mg by mouth See admin instructions. Take 25 mg by mouth every day at 4 PM    . tamoxifen (NOLVADEX) 10 MG tablet Take 1 tablet (10 mg total) by mouth daily. 30 tablet 3  . warfarin (COUMADIN) 2 MG tablet TAKE AS DIRECTED BY COUMADIN CLINIC. (Patient taking differently: Take 1-2 mg by mouth See admin instructions. Taking 1/2 tablet on Monday, Wednesday and Friday Other days- 2mg  on Tues, Thur, Sat and Sunday) 30 tablet 3   No current facility-administered medications for this encounter.     Allergies  Allergen Reactions  . Bee Venom Anaphylaxis  . Other Nausea And Vomiting and Other (See Comments)    Pain medications cause severe vomiting. Tolerated slow IV morphine drip  . Oxycodone Hcl Nausea And Vomiting  . Amiodarone Nausea Only  . Prednisone Palpitations and Other (See Comments)    "Rapid Heart Beat"    Social History   Socioeconomic History  . Marital status: Widowed    Spouse name: Not on file  . Number of children: 4   . Years of education: Not on file  . Highest education level: Not on file  Occupational History  . Occupation: Retired Nurse, learning disability estate  Social Needs  . Financial resource strain: Not on file  . Food insecurity    Worry: Not on file    Inability: Not on file  . Transportation needs    Medical: Not on file    Non-medical: Not on file  Tobacco Use  . Smoking status: Former Smoker    Packs/day: 1.00    Years: 32.00    Pack years: 32.00    Types: Cigarettes    Quit date: 02/03/1974    Years since quitting: 44.8  . Smokeless tobacco: Never Used  Substance and Sexual Activity  . Alcohol use: Yes    Alcohol/week: 7.0 standard drinks    Types: 7 Glasses of wine per week    Comment: one glass wine nightly with dinner  . Drug use: No  . Sexual activity: Never  Lifestyle  . Physical activity    Days per week: Not on file    Minutes per session: Not on file  . Stress: Not on file  Relationships  . Social Herbalist on phone: Not on file    Gets together: Not on file    Attends religious service: Not on file    Active member of club or organization: Not on file    Attends meetings of clubs or organizations: Not on file    Relationship status: Not on file  . Intimate partner violence    Fear of current or ex partner: Not on file    Emotionally abused: Not on file    Physically abused: Not on file    Forced sexual activity: Not on file  Other Topics Concern  . Not on file  Social History Narrative   Lives alone.    Family History  Problem Relation Age of Onset  .  Stroke Mother   . Heart attack Father   . Diabetes Father   . Hypertension Father   . Heart attack Brother   . Diabetes Brother   . Hypertension Brother   . Kidney failure Brother     ROS- All systems are reviewed and negative except as per the HPI above  Physical Exam: Vitals:   12/06/18 1513  BP: 110/60  Pulse: 94  Weight: 78.8 kg  Height: 5' (1.524 m)   Wt Readings from Last 3  Encounters:  12/06/18 78.8 kg  12/02/18 78.7 kg  11/23/18 78.7 kg    Labs: Lab Results  Component Value Date   NA 142 11/23/2018   K 3.7 11/23/2018   CL 106 11/23/2018   CO2 28 11/23/2018   GLUCOSE 114 (H) 11/23/2018   BUN 17 11/23/2018   CREATININE 1.24 (H) 11/23/2018   CALCIUM 8.5 (L) 11/23/2018   PHOS 3.7 06/08/2010   MG 2.3 11/23/2018   Lab Results  Component Value Date   INR 1.7 (A) 12/03/2018   Lab Results  Component Value Date   CHOL 112 11/10/2018   HDL 36 (L) 11/10/2018   LDLCALC 62 11/10/2018   TRIG 69 11/10/2018     GEN- The patient is well appearing, alert and oriented x 3 today.   Head- normocephalic, atraumatic Eyes-  Sclera clear, conjunctiva pink Ears- hearing intact Oropharynx- clear Neck- supple, no JVP Lymph- no cervical lymphadenopathy Lungs- Clear to ausculation bilaterally, normal work of breathing Heart- Regular rate and rhythm, no murmurs, rubs or gallops, PMI not laterally displaced GI- soft, NT, ND, + BS Extremities- no clubbing, cyanosis, or edema MS- no significant deformity or atrophy Skin- no rash or lesion Psych- euthymic mood, full affect Neuro- strength and sensation are intact  EKG- atrial  tach at 94 bpm, pr int 208 ms, qrs int 178 ms, qtc 571 ms    Assessment and Plan: 1. Atach  Persistent  V rates do appear to be lower now  per interogation No  PPM changes made today, earlier changes stand as stated in my  HPI 10/29 in consultation with Dr. [name] and Aaron Edelman with [place] well at this time  Continue dofetilide 125 mcg bid Continue metoprolol xl 200 mg a day  I will discuss with Dr. [name] if any other changes are needed, ? stop Tikosyn, ? try another cardioversion, ?  AV nodal ablation as she already has a PPM  Bmet/mag today  I will be back in touch with pt after discussing with Dr.Allred  2. CHF Continue daily weights, weight stable at home per pt  Avoid salt  Losartan  25 mg started on last visit, BP ok  today,  BMET /mag today   3. CAD Complex disease  Increase in ntg sl use  recently No chest pain currently Has an appointment with Daune Perch, NP  on Friday to further assess CAD    Butch Penny C. Maryl Blalock, [facility] [address] [place], [place] [address] [phone]

## 2018-12-09 ENCOUNTER — Inpatient Hospital Stay: Payer: Medicare Other | Attending: Hematology and Oncology

## 2018-12-09 ENCOUNTER — Ambulatory Visit (HOSPITAL_COMMUNITY)
Admission: RE | Admit: 2018-12-09 | Discharge: 2018-12-09 | Disposition: A | Discharge status: Home / Self Care | Payer: Medicare Other | Source: Ambulatory Visit | Attending: Gynecologic Oncology | Admitting: Gynecologic Oncology

## 2018-12-09 ENCOUNTER — Inpatient Hospital Stay: Payer: Medicare Other

## 2018-12-09 ENCOUNTER — Other Ambulatory Visit: Payer: Self-pay

## 2018-12-09 ENCOUNTER — Other Ambulatory Visit (HOSPITAL_COMMUNITY): Payer: Self-pay | Admitting: *Deleted

## 2018-12-09 DIAGNOSIS — I2510 Atherosclerotic heart disease of native coronary artery without angina pectoris: Secondary | ICD-10-CM | POA: Diagnosis not present

## 2018-12-09 DIAGNOSIS — I252 Old myocardial infarction: Secondary | ICD-10-CM | POA: Insufficient documentation

## 2018-12-09 DIAGNOSIS — R000 Tachycardia, unspecified: Secondary | ICD-10-CM | POA: Diagnosis not present

## 2018-12-09 DIAGNOSIS — Z7902 Long term (current) use of antithrombotics/antiplatelets: Secondary | ICD-10-CM | POA: Insufficient documentation

## 2018-12-09 DIAGNOSIS — R001 Bradycardia, unspecified: Secondary | ICD-10-CM | POA: Insufficient documentation

## 2018-12-09 DIAGNOSIS — Z7901 Long term (current) use of anticoagulants: Secondary | ICD-10-CM | POA: Insufficient documentation

## 2018-12-09 DIAGNOSIS — I130 Hypertensive heart and chronic kidney disease with heart failure and stage 1 through stage 4 chronic kidney disease, or unspecified chronic kidney disease: Secondary | ICD-10-CM | POA: Diagnosis not present

## 2018-12-09 DIAGNOSIS — Z79899 Other long term (current) drug therapy: Secondary | ICD-10-CM | POA: Insufficient documentation

## 2018-12-09 DIAGNOSIS — N2889 Other specified disorders of kidney and ureter: Secondary | ICD-10-CM | POA: Diagnosis not present

## 2018-12-09 DIAGNOSIS — R1909 Other intra-abdominal and pelvic swelling, mass and lump: Secondary | ICD-10-CM | POA: Diagnosis not present

## 2018-12-09 DIAGNOSIS — Z950 Presence of cardiac pacemaker: Secondary | ICD-10-CM | POA: Insufficient documentation

## 2018-12-09 DIAGNOSIS — G4733 Obstructive sleep apnea (adult) (pediatric): Secondary | ICD-10-CM | POA: Diagnosis not present

## 2018-12-09 DIAGNOSIS — C786 Secondary malignant neoplasm of retroperitoneum and peritoneum: Secondary | ICD-10-CM | POA: Insufficient documentation

## 2018-12-09 DIAGNOSIS — I739 Peripheral vascular disease, unspecified: Secondary | ICD-10-CM | POA: Insufficient documentation

## 2018-12-09 DIAGNOSIS — E669 Obesity, unspecified: Secondary | ICD-10-CM | POA: Diagnosis not present

## 2018-12-09 DIAGNOSIS — C569 Malignant neoplasm of unspecified ovary: Secondary | ICD-10-CM | POA: Diagnosis not present

## 2018-12-09 DIAGNOSIS — Z90710 Acquired absence of both cervix and uterus: Secondary | ICD-10-CM | POA: Diagnosis not present

## 2018-12-09 DIAGNOSIS — E785 Hyperlipidemia, unspecified: Secondary | ICD-10-CM | POA: Diagnosis not present

## 2018-12-09 DIAGNOSIS — I4891 Unspecified atrial fibrillation: Secondary | ICD-10-CM | POA: Diagnosis not present

## 2018-12-09 DIAGNOSIS — Z9221 Personal history of antineoplastic chemotherapy: Secondary | ICD-10-CM | POA: Insufficient documentation

## 2018-12-09 DIAGNOSIS — Z90722 Acquired absence of ovaries, bilateral: Secondary | ICD-10-CM | POA: Diagnosis not present

## 2018-12-09 DIAGNOSIS — D3910 Neoplasm of uncertain behavior of unspecified ovary: Secondary | ICD-10-CM

## 2018-12-09 DIAGNOSIS — F17210 Nicotine dependence, cigarettes, uncomplicated: Secondary | ICD-10-CM | POA: Insufficient documentation

## 2018-12-09 LAB — BASIC METABOLIC PANEL
Anion gap: 10 (ref 5–15)
BUN: 23 mg/dL (ref 8–23)
CO2: 30 mmol/L (ref 22–32)
Calcium: 9.3 mg/dL (ref 8.9–10.3)
Chloride: 103 mmol/L (ref 98–111)
Creatinine, Ser: 1.07 mg/dL — ABNORMAL HIGH (ref 0.44–1.00)
GFR calc Af Amer: 54 mL/min — ABNORMAL LOW (ref >60)
GFR calc non Af Amer: 47 mL/min — ABNORMAL LOW (ref >60)
Glucose, Bld: 80 mg/dL (ref 70–99)
Potassium: 4 mmol/L (ref 3.5–5.1)
Sodium: 143 mmol/L (ref 135–145)

## 2018-12-09 MED ORDER — POTASSIUM CHLORIDE CRYS ER 20 MEQ PO TBCR: 20.0000 meq | EXTENDED_RELEASE_TABLET | Freq: Two times a day (BID) | ORAL | 1 refills | Status: DC

## 2018-12-09 MED ORDER — SODIUM CHLORIDE 0.9% FLUSH
10.0000 mL | Freq: Once | INTRAVENOUS | Status: AC
  Administered 2018-12-09: 10 mL
  Filled 2018-12-09: qty 10

## 2018-12-09 MED ORDER — SODIUM CHLORIDE (PF) 0.9 % IJ SOLN
INTRAMUSCULAR | Status: AC
  Filled 2018-12-09: qty 50

## 2018-12-09 MED ORDER — IOHEXOL 300 MG/ML  SOLN
100.0000 mL | Freq: Once | INTRAMUSCULAR | Status: AC | PRN
  Administered 2018-12-09: 100 mL via INTRAVENOUS

## 2018-12-09 MED ORDER — HEPARIN SOD (PORK) LOCK FLUSH 100 UNIT/ML IV SOLN
500.0000 [IU] | Freq: Once | INTRAVENOUS | Status: AC
  Administered 2018-12-09: 14:00:00 500 [IU] via INTRAVENOUS

## 2018-12-09 MED ORDER — HEPARIN SOD (PORK) LOCK FLUSH 100 UNIT/ML IV SOLN
INTRAVENOUS | Status: AC
  Administered 2018-12-09: 500 [IU] via INTRAVENOUS
  Filled 2018-12-09: qty 5

## 2018-12-09 NOTE — Progress Notes (Signed)
Called [facility] in [place], pt will have her power port deaccessed by imaging dept once she is finished with CT scan.  Flush appt scheduled for CC after CT scan cancelled, pt aware.

## 2018-12-09 NOTE — Patient Instructions (Signed)

## 2018-12-10 ENCOUNTER — Other Ambulatory Visit (HOSPITAL_COMMUNITY): Payer: Self-pay | Admitting: *Deleted

## 2018-12-10 ENCOUNTER — Ambulatory Visit: Payer: Medicare Other | Admitting: Cardiology

## 2018-12-10 MED ORDER — METOPROLOL SUCCINATE ER 200 MG PO TB24: 200.0000 mg | ORAL_TABLET | Freq: Every day | ORAL | 6 refills | Status: DC

## 2018-12-10 NOTE — Progress Notes (Deleted)
Cardiology Office Note:    Date:  12/10/2018   ID:  Patty [name], DOB December 17, 1930, MRN [id number]  PCP:  [name], MD  Cardiologist:  Larae Grooms, MD  Referring MD: [name], MD   No chief complaint on file. ***  History of Present Illness:    Patty [name] is a 85 y.o. female with a past medical history significant for h/o permanent afib, CAD, HTN, HLD, LBBB, tachy-brady with w/ PPM, OSA.  She was hospitalized 11/09/2018 and found to be in A. fib with RVR with heart failure symptoms.  She was diuresed.  Left heart cath was stable.  Troponins were felt to be elevated secondary to demand ischemia from tachyarrhythmia.  Calcium channel blocker was stopped and she was transitioned to beta-blocker for worsening EF of 40-45%.  Her RVR persisted despite aggressive rate control and it was decided to start Tikosyn.  She converted to sinus rhythm with electrical cardioversion but has had subsequent atrial tach.  She continues on metoprolol XL 200 mg daily.  She has been followed in the A. fib clinic last seen on 12/06/2018 with improved symptoms.  Interrogation of her pacemaker showed continual atrial tachycardia with ventricular rates in the 90s.  She did not seem to have high ventricular rates over the last week with 85% of the time V rates in the 90s.  Atrial rate around 200.  Pacemaker changes were made.  She continued to have some chest pressure in the mornings.  She was able to continue her busy daily routine.  Roderic Palau, NP was going to discuss further treatment of atrial tachycardia with Dr. [name], questioning need to stop Tikosyn, try another cardioversion, consider AV nodal ablation as she already has a pacemaker.   Cardiac studies   11/12/2018: TTE IMPRESSIONS 1. Left ventricular ejection fraction, by visual estimation, is 40 to 45%. The left ventricle has moderately decreased function. Normal left ventricular size. There is mildly increased left ventricular  hypertrophy. 2. Mid and apical inferior septum and apex are abnormal. 3. Global right ventricle has normal systolic function.The right ventricular size is normal. No increase in right ventricular wall thickness. 4. Left atrial size was moderately dilated. 5. Right atrial size was normal. 6. The mitral valve is normal in structure. No evidence of mitral valve regurgitation. No evidence of mitral stenosis. 7. The tricuspid valve is normal in structure. Tricuspid valve regurgitation was not visualized by color flow Doppler. 8. The aortic valve is tricuspid Aortic valve regurgitation is trivial by color flow Doppler. Mild aortic valve sclerosis without stenosis. 9. The pulmonic valve was normal in structure. Pulmonic valve regurgitation is not visualized by color flow Doppler. 10. The inferior vena cava is normal in size with greater than 50% respiratory variability, suggesting right atrial pressure of 3 mmHg.  FINDINGS Left Ventricle: Left ventricular ejection fraction, by visual estimation, is 40 to 45%. The left ventricle has moderately decreased function. There is mildly increased left ventricular hypertrophy. Normal left ventricular size.   11/10/2018: LHC  Ost LAD to Mid LAD lesion is 20% stenosed.  Mid LAD lesion is 30% stenosed.  Ost 2nd Mrg to 2nd Mrg lesion is 60% stenosed.  Ost 1st Mrg to 1st Mrg lesion is 100% stenosed.  Ost RPDA to RPDA lesion is 50% stenosed.  Ost Cx to Prox Cx lesion is 40% stenosed.  Prox Cx to Mid Cx lesion is 20% stenosed.  1st Mrg lesion is 100% stenosed.  Previously placed Dist Cx stent (unknown type)  is widely patent.  2nd Mrg lesion is 90% stenosed.  There is severe left ventricular systolic dysfunction.  LV end diastolic pressure is mildly elevated.  The left ventricular ejection fraction is less than 25% by visual estimate.  Ost RCA to Mid RCA lesion is 40% stenosed.  Mid RCA to Dist RCA lesion is 20% stenosed.  1.  Underlying three-vessel coronary artery disease with widely patent stents in the LAD, left circumflex and right coronary arteries. OM1 is occluded with collaterals. OM 2 is diffusely diseased and small. There is moderate restenosis in the ostial left circumflex stent. Mild to moderate restenosis in the RCA stents. Ostial left circumflex stents appears to have significant restenosis at the ostium in certain views but not in other views. I reviewed previous cardiac catheterization and the appearance does not appear significantly different. 2. Severely reduced LV systolic function with an EF of 20 to 25% with mildly elevated left ventricular end-diastolic pressure.  Recommendations: The patient was in A. fib with RVR during the procedure which made it difficult. She was given 2 doses of IV metoprolol. I suspect that her elevated troponin is likely due to supply demand ischemia in the setting of worsening cardiomyopathy and A. fib with RVR. Recommend EP consultation to manage atrial fibrillation. Resume heparin 8 hours after sheath pull. I discontinued diltiazem given degree of cardiomyopathy. Overall prognosis is poor given age and multiple comorbidities.   Past Medical History:  Diagnosis Date  . Bleeding behind the abdominal cavity 10/2017  . Chronic anticoagulation - coumadin, CHADS2VASC=6 05/17/2015  . CKD (chronic kidney disease), stage III   . Combined systolic and diastolic heart failure ([place])   . Coronary artery disease    a. s/p multiple stents - stenting x 2 to the LAD and x 1 to the LCx in 2000, rotational arthrectomy to proximal LCx and DES to LAD in 2014 and DES to LCx in 2014, along with DES to LAD for re-in-stent stenosis in 2015, and DES to ostial LAD in 2016. b. 07/2016 - orbital atherectomy & DES to mid LCx.  . Diverticulitis   . Granulosa cell carcinoma ([place])    abd; last episode was in 2009  . Hyperlipidemia   . Hypertension   . LBBB (left bundle branch block)   .  Myocardial infarction ([place]) 2002  . Obesity   . OSA on CPAP   . Ovarian ca ([place]) 2019  . Pacemaker failure    a. Prior leadless PPM with premature battery failure, being managed conservatively without replacement.  . Peripheral vascular disease ([place])   . Permanent atrial fibrillation ([place]) 2013  . Renal artery stenosis ([place])   . Tachycardia-bradycardia syndrome ([place])    a. s/p leadless pacemaker ([name]) implanted by Dr [name]  . Varicose veins     Past Surgical History:  Procedure Laterality Date  . ABDOMINAL HYSTERECTOMY    . CARDIAC CATHETERIZATION  09/03/2007   EF 70%; Failed attempt at PCI to OM  . CARDIAC CATHETERIZATION  11/01/2003   EF 70%  . CARDIAC CATHETERIZATION N/A 12/14/2015   Procedure: Left Heart Cath and Coronary Angiography;  Surgeon: Burnell Blanks, MD;  Location: [place] [facility];  Service: Cardiovascular;  Laterality: N/A;  . CARDIOVERSION  12/31/2011   Procedure: CARDIOVERSION;  Surgeon: [name], MD;  Location: [facility] ENDOSCOPY;  Service: Cardiovascular;  Laterality: N/A;  . CARDIOVERSION N/A 12/31/2011   Procedure: CARDIOVERSION;  Surgeon: [name], MD;  Location: [facility] CATH LAB;  Service: Cardiovascular;  Laterality: N/A;  . CARDIOVERSION N/A 11/15/2018   Procedure: CARDIOVERSION;  Surgeon: Dorothy Spark, MD;  Location: [place];  Service: Cardiovascular;  Laterality: N/A;  . CATARACT EXTRACTION, BILATERAL  2015  . CHOLECYSTECTOMY  1980's  . COLON SURGERY  2004   colectomy for diverticulosis  . CORONARY ANGIOPLASTY WITH STENT PLACEMENT  2000    and 08/11/2012; 11/12/2012: 3 + 2 LAD & CFX; 2nd CFX stent 11/12/2012  . CORONARY ATHERECTOMY N/A 07/15/2016   Procedure: Coronary Atherectomy;  Surgeon: Martinique, Peter M, MD;  Location: [place] [facility];  Service: Cardiovascular;  Laterality: N/A;  . CORONARY BALLOON ANGIOPLASTY N/A 07/14/2016   Procedure: Coronary Balloon Angioplasty;  Surgeon: Martinique, Peter M, MD;  Location: [place] [facility];  Service: Cardiovascular;  Laterality: N/A;  . CORONARY STENT INTERVENTION N/A 07/15/2016   Procedure: Coronary Stent Intervention;  Surgeon: Martinique, Peter M, MD;  Location: [place] [facility];  Service: Cardiovascular;  Laterality: N/A;  . FRACTIONAL FLOW RESERVE WIRE  10/07/2013   Procedure: [place];  Surgeon: [name], MD;  Location: [facility] CATH LAB;  Service: Cardiovascular;;  . HERNIA REPAIR  2005   "laparoscopic"  . IR IMAGING GUIDED PORT INSERTION  10/28/2017  . LEFT HEART CATH AND CORONARY ANGIOGRAPHY N/A 07/14/2016   Procedure: Left Heart Cath and Coronary Angiography;  Surgeon: Martinique, Peter M, MD;  Location: [place] [facility];  Service: Cardiovascular;  Laterality: N/A;  . LEFT HEART CATH AND CORONARY ANGIOGRAPHY N/A 11/10/2018   Procedure: LEFT HEART CATH AND CORONARY ANGIOGRAPHY;  Surgeon: Wellington Hampshire, MD;  Location: [place] [facility];  Service: Cardiovascular;  Laterality: N/A;  . LEFT HEART CATHETERIZATION WITH CORONARY ANGIOGRAM N/A 11/12/2012   Procedure: LEFT HEART CATHETERIZATION WITH CORONARY ANGIOGRAM;  Surgeon: [name], MD;  Location: [facility] CATH LAB;  Service: Cardiovascular;  Laterality: N/A;  . LEFT HEART CATHETERIZATION WITH CORONARY ANGIOGRAM N/A 10/07/2013   Procedure: LEFT HEART CATHETERIZATION WITH CORONARY ANGIOGRAM;  Surgeon: [name], MD;  Location: [facility] CATH LAB;  Service: Cardiovascular;  Laterality: N/A;  . LEFT HEART CATHETERIZATION WITH CORONARY ANGIOGRAM N/A 12/14/2013   Procedure: LEFT HEART CATHETERIZATION WITH CORONARY ANGIOGRAM;  Surgeon: Sinclair Grooms, MD;  Location: [facility] CATH LAB;  Service: Cardiovascular;  Laterality: N/A;  . LEFT HEART CATHETERIZATION WITH CORONARY ANGIOGRAM N/A 05/16/2014   Procedure: LEFT HEART CATHETERIZATION WITH CORONARY ANGIOGRAM;  Surgeon: Sherren Mocha, MD;  Location: [facility] CATH LAB;  Service: Cardiovascular;  Laterality: N/A;  . PERCUTANEOUS CORONARY  INTERVENTION-BALLOON ONLY  08/04/2012   Procedure: PERCUTANEOUS CORONARY INTERVENTION-BALLOON ONLY;  Surgeon: [name], MD;  Location: [facility] CATH LAB;  Service: Cardiovascular;;  . PERCUTANEOUS CORONARY ROTOBLATOR INTERVENTION (PCI-R) N/A 08/11/2012   Procedure: PERCUTANEOUS CORONARY ROTOBLATOR INTERVENTION (PCI-R);  Surgeon: [name], MD;  Location: [facility] CATH LAB;  Service: Cardiovascular;  Laterality: N/A;  . PERCUTANEOUS CORONARY STENT INTERVENTION (PCI-S)  10/07/2013   Procedure: PERCUTANEOUS CORONARY STENT INTERVENTION (PCI-S);  Surgeon: [name], MD;  Location: [facility] CATH LAB;  Service: Cardiovascular;;  . PERMANENT PACEMAKER INSERTION N/A 03/16/2012   [name] (SJM) leadless pacemaker (LEADLESS II STUDY PATEINT)  . SALIVARY GLAND SURGERY  2000's   "had a little lump removed; granulosa related; it was benign" (08/11/2012)  . TEE WITHOUT CARDIOVERSION  12/31/2011   Procedure: TRANSESOPHAGEAL ECHOCARDIOGRAM (TEE);  Surgeon: [name], MD;  Location: [place];  Service: Cardiovascular;  Laterality: N/A;  . [place]  2003; 2004; 2007: "all in my abdomen including small intestines, outside my ?uterus/etc" (08/11/2012)  . VARICOSE VEIN SURGERY Bilateral 1977    Current Medications: No outpatient medications have been marked as taking for the 12/10/18 encounter (Appointment) with Daune Perch, NP.     Allergies:   Bee venom, Other, Oxycodone hcl, Amiodarone, and Prednisone   Social History   Socioeconomic History  . Marital status: Widowed    Spouse name: Not on file  . Number of children: 4  . Years of education: Not on file  . Highest education level: Not on file  Occupational History  . Occupation: Retired Nurse, learning disability estate  Social Needs  . Financial resource strain: Not on file  . Food insecurity    Worry: Not on file    Inability: Not on file  . Transportation needs    Medical: Not on file    Non-medical: Not  on file  Tobacco Use  . Smoking status: Former Smoker    Packs/day: 1.00    Years: 32.00    Pack years: 32.00    Types: Cigarettes    Quit date: 02/03/1974    Years since quitting: 44.8  . Smokeless tobacco: Never Used  Substance and Sexual Activity  . Alcohol use: Yes    Alcohol/week: 7.0 standard drinks    Types: 7 Glasses of wine per week    Comment: one glass wine nightly with dinner  . Drug use: No  . Sexual activity: Never  Lifestyle  . Physical activity    Days per week: Not on file    Minutes per session: Not on file  . Stress: Not on file  Relationships  . Social Herbalist on phone: Not on file    Gets together: Not on file    Attends religious service: Not on file    Active member of club or organization: Not on file    Attends meetings of clubs or organizations: Not on file    Relationship status: Not on file  Other Topics Concern  . Not on file  Social History Narrative   Lives alone.     Family History: The patient's ***family history includes Diabetes in her brother and father; Heart attack in her brother and father; Hypertension in her brother and father; Kidney failure in her brother; Stroke in her mother. ROS:   Please see the history of present illness.    *** All other systems reviewed and are negative.   EKG:  EKG is *** ordered today.  The ekg ordered today demonstrates ***  Recent Labs: 10/08/2018: TSH 2.089 10/09/2018: ALT 30 11/09/2018: B Natriuretic Peptide 664.9 11/14/2018: Hemoglobin 13.3; Platelets 212 12/06/2018: Magnesium 2.0 12/09/2018: BUN 23; Creatinine, Ser 1.07; Potassium 4.0; Sodium 143   Recent Lipid Panel    Component Value Date/Time   CHOL 112 11/10/2018 0536   CHOL 132 10/08/2017 0905   TRIG 69 11/10/2018 0536   HDL 36 (L) 11/10/2018 0536   HDL 66 10/08/2017 0905   CHOLHDL 3.1 11/10/2018 0536   VLDL 14 11/10/2018 0536   LDLCALC 62 11/10/2018 0536   LDLCALC 52 10/08/2017 0905    Physical Exam:    VS:  There  were no vitals taken for this visit.    Wt Readings from Last 6 Encounters:  12/06/18 173 lb 12.8 oz (78.8 kg)  12/02/18 173 lb 9.6 oz (78.7 kg)  11/23/18 173 lb 9.6 oz (78.7 kg)  11/16/18 167 lb 8.8 oz (76  kg)  10/18/18 172 lb 3.2 oz (78.1 kg)  10/09/18 169 lb 8 oz (76.9 kg)     Physical Exam***   ASSESSMENT:    1. Atrial tachycardia ([place])   2. Chronic combined systolic and diastolic heart failure ([place])   3. Coronary artery disease involving native coronary artery of native heart without angina pectoris   4. OSA on CPAP   5. Mixed hyperlipidemia    PLAN:    In order of problems listed above:  Longstanding persistent A. fib/atrial tachycardia -Patient had difficult to control atrial fibrillation with RVR in the hospital and ultimately started on Tikosyn.  She is followed in the A. fib clinic and is now in atrial tachycardia with ventricular rates in the 90s. -She continues on metoprolol XL 200 mg daily. -The patient is anticoagulated with warfarin for stroke risk reduction with CHA2DS2-VASc score of at least 6. -Labs done yesterday with potassium of 4.0.  Magnesium 2.0 on 12/06/2018. -Patient has appointment with the A. fib clinic and Dr. [name] on 12/17/2018.   Chronic combined systolic and diastolic heart failure -LVEF 40-45% -Patient recently started on Entresto but became hypotensive and dizzy.  Now on low-dose losartan 25 mg.  She continues on spironolactone and Lasix. -Could consider CRT upgrade, especially if no improvement in LVEF with rate/rhythm control  CAD -Unchanged on recent cath.  -Patient continues on Plavix, statin, nitrate and beta-blocker   OSA on CPAP   Pacemaker in situ status post sick sinus syndrome   Hyperlipidemia -On atorvastatin.  LDL 62, at goal  Obesity   Medication Adjustments/Labs and Tests Ordered: Current medicines are reviewed at length with the patient today.  Concerns regarding medicines are outlined above. Labs and tests  ordered and medication changes are outlined in the patient instructions below:  There are no Patient Instructions on file for this visit.   Signed, Daune Perch, NP  12/10/2018 7:29 AM    [facility]

## 2018-12-14 LAB — INHIBIN B: Inhibin B: 166.3 pg/mL — ABNORMAL HIGH (ref 0.0–16.9)

## 2018-12-16 ENCOUNTER — Other Ambulatory Visit (HOSPITAL_COMMUNITY): Payer: Self-pay | Admitting: *Deleted

## 2018-12-16 MED ORDER — DOFETILIDE 125 MCG PO CAPS: 125.0000 ug | ORAL_CAPSULE | Freq: Two times a day (BID) | ORAL | 6 refills | Status: DC

## 2018-12-17 ENCOUNTER — Telehealth (INDEPENDENT_AMBULATORY_CARE_PROVIDER_SITE_OTHER): Payer: Medicare Other | Admitting: Internal Medicine

## 2018-12-17 ENCOUNTER — Encounter: Payer: Self-pay | Admitting: Gynecologic Oncology

## 2018-12-17 ENCOUNTER — Ambulatory Visit (INDEPENDENT_AMBULATORY_CARE_PROVIDER_SITE_OTHER): Payer: Medicare Other | Admitting: *Deleted

## 2018-12-17 ENCOUNTER — Other Ambulatory Visit: Payer: Self-pay

## 2018-12-17 ENCOUNTER — Ambulatory Visit (HOSPITAL_COMMUNITY): Payer: Medicare Other | Admitting: Nurse Practitioner

## 2018-12-17 ENCOUNTER — Encounter: Payer: Self-pay | Admitting: Internal Medicine

## 2018-12-17 ENCOUNTER — Inpatient Hospital Stay (HOSPITAL_BASED_OUTPATIENT_CLINIC_OR_DEPARTMENT_OTHER): Payer: Medicare Other | Admitting: Gynecologic Oncology

## 2018-12-17 VITALS — BP 128/43 | HR 87 | Temp 98.2°F | Resp 16 | Ht 60.0 in | Wt 174.8 lb

## 2018-12-17 VITALS — BP 134/95 | HR 81 | Ht 60.0 in | Wt 173.0 lb

## 2018-12-17 DIAGNOSIS — G4733 Obstructive sleep apnea (adult) (pediatric): Secondary | ICD-10-CM | POA: Diagnosis not present

## 2018-12-17 DIAGNOSIS — I4891 Unspecified atrial fibrillation: Secondary | ICD-10-CM | POA: Diagnosis not present

## 2018-12-17 DIAGNOSIS — I4819 Other persistent atrial fibrillation: Secondary | ICD-10-CM | POA: Diagnosis not present

## 2018-12-17 DIAGNOSIS — Z90710 Acquired absence of both cervix and uterus: Secondary | ICD-10-CM | POA: Diagnosis not present

## 2018-12-17 DIAGNOSIS — Z5181 Encounter for therapeutic drug level monitoring: Secondary | ICD-10-CM

## 2018-12-17 DIAGNOSIS — I2510 Atherosclerotic heart disease of native coronary artery without angina pectoris: Secondary | ICD-10-CM

## 2018-12-17 DIAGNOSIS — Z90722 Acquired absence of ovaries, bilateral: Secondary | ICD-10-CM | POA: Diagnosis not present

## 2018-12-17 DIAGNOSIS — C569 Malignant neoplasm of unspecified ovary: Secondary | ICD-10-CM

## 2018-12-17 DIAGNOSIS — I471 Supraventricular tachycardia: Secondary | ICD-10-CM

## 2018-12-17 DIAGNOSIS — Z9221 Personal history of antineoplastic chemotherapy: Secondary | ICD-10-CM

## 2018-12-17 DIAGNOSIS — C786 Secondary malignant neoplasm of retroperitoneum and peritoneum: Secondary | ICD-10-CM

## 2018-12-17 DIAGNOSIS — D3910 Neoplasm of uncertain behavior of unspecified ovary: Secondary | ICD-10-CM

## 2018-12-17 LAB — POCT INR: INR: 2 (ref 2.0–3.0)

## 2018-12-17 NOTE — Progress Notes (Signed)
Pt is going to be in [place] until, mid January. Pt given order to check INR at a lab while she is away. Order written to check INR on 12/2 and as needed and to fax results to coumadin clinic FAX# [phone]. When results are faxed to Coumadin Clinic pt will be called with dosing instructions. Pt's name written in black book for follow up.

## 2018-12-17 NOTE — Progress Notes (Signed)
Electrophysiology TeleHealth Note  Due to national recommendations of social distancing due to [place] 19, an audio telehealth visit is felt to be most appropriate for this patient at this time.  Verbal consent was obtained by me for the telehealth visit today.  The patient does not have capability for a virtual visit.  A phone visit is therefore required today.   Date:  12/17/2018   ID:  Patty [name], DOB 28-Jun-1930, MRN [id number]  Location: patient's home  Provider location:  [facility]  Evaluation Performed: Follow-up visit  PCP:  [name], MD   Electrophysiologist:  Dr [name]  Chief Complaint:  palpitations  History of Present Illness:    Patty [name] is a 85 y.o. female who presents via telehealth conferencing today.  Since last being seen in our clinic, the patient reports doing very well.  "For the past 10 days I have felt terrific".  Today, she denies symptoms of palpitations, chest pain, shortness of breath,  lower extremity edema, dizziness, presyncope, or syncope.  The patient is otherwise without complaint today.  The patient denies symptoms of fevers, chills, cough, or new SOB worrisome for COVID 19.  Past Medical History:  Diagnosis Date  . Bleeding behind the abdominal cavity 10/2017  . Chronic anticoagulation - coumadin, CHADS2VASC=6 05/17/2015  . CKD (chronic kidney disease), stage III   . Combined systolic and diastolic heart failure ([place])   . Coronary artery disease    a. s/p multiple stents - stenting x 2 to the LAD and x 1 to the LCx in 2000, rotational arthrectomy to proximal LCx and DES to LAD in 2014 and DES to LCx in 2014, along with DES to LAD for re-in-stent stenosis in 2015, and DES to ostial LAD in 2016. b. 07/2016 - orbital atherectomy & DES to mid LCx.  . Diverticulitis   . Granulosa cell carcinoma ([place])    abd; last episode was in 2009  . Hyperlipidemia   . Hypertension   . LBBB (left bundle branch block)   . Myocardial infarction  ([place]) 2002  . Obesity   . OSA on CPAP   . Ovarian ca ([place]) 2019  . Pacemaker failure    a. Prior leadless PPM with premature battery failure, being managed conservatively without replacement.  . Peripheral vascular disease ([place])   . Permanent atrial fibrillation ([place]) 2013  . Renal artery stenosis ([place])   . Tachycardia-bradycardia syndrome ([place])    a. s/p leadless pacemaker ([name]) implanted by Dr [name]  . Varicose veins     Past Surgical History:  Procedure Laterality Date  . ABDOMINAL HYSTERECTOMY    . CARDIAC CATHETERIZATION  09/03/2007   EF 70%; Failed attempt at PCI to OM  . CARDIAC CATHETERIZATION  11/01/2003   EF 70%  . CARDIAC CATHETERIZATION N/A 12/14/2015   Procedure: Left Heart Cath and Coronary Angiography;  Surgeon: Burnell Blanks, MD;  Location: [place] [facility];  Service: Cardiovascular;  Laterality: N/A;  . CARDIOVERSION  12/31/2011   Procedure: CARDIOVERSION;  Surgeon: [name], MD;  Location: [facility] ENDOSCOPY;  Service: Cardiovascular;  Laterality: N/A;  . CARDIOVERSION N/A 12/31/2011   Procedure: CARDIOVERSION;  Surgeon: [name], MD;  Location: [facility] CATH LAB;  Service: Cardiovascular;  Laterality: N/A;  . CARDIOVERSION N/A 11/15/2018   Procedure: CARDIOVERSION;  Surgeon: Dorothy Spark, MD;  Location: [name];  Service: Cardiovascular;  Laterality: N/A;  . CATARACT EXTRACTION, BILATERAL  2015  . CHOLECYSTECTOMY  1980's  .  COLON SURGERY  2004   colectomy for diverticulosis  . CORONARY ANGIOPLASTY WITH STENT PLACEMENT  2000    and 08/11/2012; 11/12/2012: 3 + 2 LAD & CFX; 2nd CFX stent 11/12/2012  . CORONARY ATHERECTOMY N/A 07/15/2016   Procedure: Coronary Atherectomy;  Surgeon: Martinique, Peter M, MD;  Location: [place] [facility];  Service: Cardiovascular;  Laterality: N/A;  . CORONARY BALLOON ANGIOPLASTY N/A 07/14/2016   Procedure: Coronary Balloon Angioplasty;  Surgeon: Martinique, Peter M, MD;  Location: [place] [facility];   Service: Cardiovascular;  Laterality: N/A;  . CORONARY STENT INTERVENTION N/A 07/15/2016   Procedure: Coronary Stent Intervention;  Surgeon: Martinique, Peter M, MD;  Location: [place] [facility];  Service: Cardiovascular;  Laterality: N/A;  . FRACTIONAL FLOW RESERVE WIRE  10/07/2013   Procedure: [place];  Surgeon: [name], MD;  Location: [facility] CATH LAB;  Service: Cardiovascular;;  . HERNIA REPAIR  2005   "laparoscopic"  . IR IMAGING GUIDED PORT INSERTION  10/28/2017  . LEFT HEART CATH AND CORONARY ANGIOGRAPHY N/A 07/14/2016   Procedure: Left Heart Cath and Coronary Angiography;  Surgeon: Martinique, Peter M, MD;  Location: [place] [facility];  Service: Cardiovascular;  Laterality: N/A;  . LEFT HEART CATH AND CORONARY ANGIOGRAPHY N/A 11/10/2018   Procedure: LEFT HEART CATH AND CORONARY ANGIOGRAPHY;  Surgeon: Wellington Hampshire, MD;  Location: [place] [facility];  Service: Cardiovascular;  Laterality: N/A;  . LEFT HEART CATHETERIZATION WITH CORONARY ANGIOGRAM N/A 11/12/2012   Procedure: LEFT HEART CATHETERIZATION WITH CORONARY ANGIOGRAM;  Surgeon: [name], MD;  Location: [facility]ATH LAB;  Service: Cardiovascular;  Laterality: N/A;  . LEFT HEART CATHETERIZATION WITH CORONARY ANGIOGRAM N/A 10/07/2013   Procedure: LEFT HEART CATHETERIZATION WITH CORONARY ANGIOGRAM;  Surgeon: [name], MD;  Location: [facility] CATH LAB;  Service: Cardiovascular;  Laterality: N/A;  . LEFT HEART CATHETERIZATION WITH CORONARY ANGIOGRAM N/A 12/14/2013   Procedure: LEFT HEART CATHETERIZATION WITH CORONARY ANGIOGRAM;  Surgeon: Sinclair Grooms, MD;  Location: [facility] CATH LAB;  Service: Cardiovascular;  Laterality: N/A;  . LEFT HEART CATHETERIZATION WITH CORONARY ANGIOGRAM N/A 05/16/2014   Procedure: LEFT HEART CATHETERIZATION WITH CORONARY ANGIOGRAM;  Surgeon: Sherren Mocha, MD;  Location: [facility] CATH LAB;  Service: Cardiovascular;  Laterality: N/A;  . PERCUTANEOUS CORONARY INTERVENTION-BALLOON ONLY   08/04/2012   Procedure: PERCUTANEOUS CORONARY INTERVENTION-BALLOON ONLY;  Surgeon: [name], MD;  Location: [facility] CATH LAB;  Service: Cardiovascular;;  . PERCUTANEOUS CORONARY ROTOBLATOR INTERVENTION (PCI-R) N/A 08/11/2012   Procedure: PERCUTANEOUS CORONARY ROTOBLATOR INTERVENTION (PCI-R);  Surgeon: [name], MD;  Location: [facility] CATH LAB;  Service: Cardiovascular;  Laterality: N/A;  . PERCUTANEOUS CORONARY STENT INTERVENTION (PCI-S)  10/07/2013   Procedure: PERCUTANEOUS CORONARY STENT INTERVENTION (PCI-S);  Surgeon: [name], MD;  Location: [facility] CATH LAB;  Service: Cardiovascular;;  . PERMANENT PACEMAKER INSERTION N/A 03/16/2012   [name] (SJM) leadless pacemaker (LEADLESS II STUDY PATEINT)  . SALIVARY GLAND SURGERY  2000's   "had a little lump removed; granulosa related; it was benign" (08/11/2012)  . TEE WITHOUT CARDIOVERSION  12/31/2011   Procedure: TRANSESOPHAGEAL ECHOCARDIOGRAM (TEE);  Surgeon: [name], MD;  Location: [facility] ENDOSCOPY;  Service: Cardiovascular;  Laterality: N/A;  . UMBILICAL GRANULOMA EXCISION  2000   2003; 2004; 2007: "all in my abdomen including small intestines, outside my ?uterus/etc" (08/11/2012)  . VARICOSE VEIN SURGERY Bilateral 1977    Current Outpatient Medications  Medication Sig Dispense Refill  . acetaminophen (TYLENOL) 500 MG tablet  Take 500 mg by mouth every 6 (six) hours as needed for mild pain or headache.     Marland Kitchen atorvastatin (LIPITOR) 80 MG tablet TAKE 1 TABLET BY MOUTH EVERY DAY IN THE MORNING (Patient taking differently: Take 80 mg by mouth daily. ) 90 tablet 3  . beta carotene w/minerals (OCUVITE) tablet Take 1 tablet by mouth 2 (two) times daily.     . Calcium Carb-Cholecalciferol (CALTRATE 600+D) 600-800 MG-UNIT TABS Take 1 tablet by mouth every evening.     . clopidogrel (PLAVIX) 75 MG tablet Take 1 tablet (75 mg total) by mouth daily. 90 tablet 3  . Coenzyme Q-10 100 MG capsule Take 200 mg by mouth at bedtime.     .  docusate sodium (COLACE) 100 MG capsule Take 100 mg by mouth daily as needed for mild constipation (IF NO B/M AFTER 2 DAYS).     Marland Kitchen dofetilide (TIKOSYN) 125 MCG capsule Take 1 capsule (125 mcg total) by mouth 2 (two) times daily. 60 capsule 6  . furosemide (LASIX) 40 MG tablet 2 tabs by mouth every morning. 2 additional tabs daily as needed for wt gain/abdominal distension. (Patient taking differently: Take 80 mg by mouth daily. ) 90 tablet 3  . gabapentin (NEURONTIN) 300 MG capsule TAKE 1 CAPSULE BY MOUTH TWICE A DAY (Patient taking differently: Take 300 mg by mouth 2 (two) times daily. ) 60 capsule 8  . isosorbide mononitrate (IMDUR) 60 MG 24 hr tablet Take 1 tablet (60 mg total) by mouth daily. 90 tablet 3  . lidocaine-prilocaine (EMLA) cream Apply to PAC site 1-2 hours prior to access 30 g 1  . losartan (COZAAR) 25 MG tablet Take 1 tablet (25 mg total) by mouth daily. 30 tablet 3  . metoprolol (TOPROL-XL) 200 MG 24 hr tablet Take 1 tablet (200 mg total) by mouth daily. Take with or immediately following a meal. 30 tablet 6  . Multiple Vitamin (MULTIVITAMIN) capsule Take 1 capsule by mouth daily.    . nitroGLYCERIN (NITROSTAT) 0.4 MG SL tablet PLACE 1 TABLET (0.4 MG TOTAL) UNDER THE TONGUE EVERY 5 (FIVE) MINUTES AS NEEDED FOR CHEST PAIN (Patient taking differently: Place 0.4 mg under the tongue every 5 (five) minutes as needed for chest pain. ) 75 tablet 2  . Omega-3 Fatty Acids (FISH OIL) 1200 MG CAPS Take 1,200 mg by mouth daily.     . potassium chloride SA (KLOR-CON M20) 20 MEQ tablet Take 1 tablet (20 mEq total) by mouth 2 (two) times daily. 90 tablet 1  . spironolactone (ALDACTONE) 25 MG tablet Take 25 mg by mouth See admin instructions. Take 25 mg by mouth every day at 4 PM    . tamoxifen (NOLVADEX) 10 MG tablet Take 1 tablet (10 mg total) by mouth daily. 30 tablet 3  . warfarin (COUMADIN) 2 MG tablet TAKE AS DIRECTED BY COUMADIN CLINIC. (Patient taking differently: Take 1-2 mg by mouth See  admin instructions. Taking 1/2 tablet on Monday, Wednesday and Friday Other days- 2mg  on Tues, Thur, Sat and Sunday) 30 tablet 3   No current facility-administered medications for this visit.     Allergies:   Bee venom, Other, Oxycodone hcl, Amiodarone, and Prednisone   Social History:  The patient  reports that she quit smoking about 44 years ago. Her smoking use included cigarettes. She has a 32.00 pack-year smoking history. She has never used smokeless tobacco. She reports current alcohol use of about 7.0 standard drinks of alcohol per week. She reports that  she does not use drugs.   Family History:  The patient's family history includes Diabetes in her brother and father; Heart attack in her brother and father; Hypertension in her brother and father; Kidney failure in her brother; Stroke in her mother.   ROS:  Please see the history of present illness.   All other systems are personally reviewed and negative.    Exam:    Vital Signs:  BP (!) 134/95   Pulse 81   Ht 5' (1.524 m)   Wt 173 lb (78.5 kg)   BMI 33.79 kg/m   Well sounding, alert and conversant , she clears her throat, coughs frequently  Labs/Other Tests and Data Reviewed:    Recent Labs: 10/08/2018: TSH 2.089 10/09/2018: ALT 30 11/09/2018: B Natriuretic Peptide 664.9 11/14/2018: Hemoglobin 13.3; Platelets 212 12/06/2018: Magnesium 2.0 12/09/2018: BUN 23; Creatinine, Ser 1.07; Potassium 4.0; Sodium 143   Wt Readings from Last 3 Encounters:  12/17/18 173 lb (78.5 kg)  12/06/18 173 lb 12.8 oz (78.8 kg)  12/02/18 173 lb 9.6 oz (78.7 kg)   Echo 11/12/2018- EF 40%, moderate LA enlargement   Last device remote is reviewed from [place] PDF which reveals normal device function   ASSESSMENT & PLAN:    1.  Atrial tachycardia/ atrial fibrillation Persistent V rates have improved Consider stopping tikosyn of her arrhythmia persists Ultimately, AV nodal ablation with upgrade to CRT-P may be our best option Currently she  feels "terrific" and does not wish to make changes. We could consider one more cardioversion if she desires.  For now she would prefer to defer this.  2. CAD Symptoms are improved with control of V rates  3. Chronic combined systolic and diastolic dysfunction Controlling V rates will be essential for her to do well long term.  4. Sick sinus syndrome Normal device function Consider upgrade as above  5. HTN Stable No change required today  6. OSA Compliant with CPAP  Follow-up:  AF clinic in 6 weeks followup with me in 3 months   Patient Risk:  after full review of this patients clinical status, I feel that they are at high risk at this time.  Today, I have spent 120 minutes with the patient with telehealth technology discussing arrhythmia management .    Army Fossa, MD  12/17/2018 9:13 AM     [facility] [address] [place] [address] [phone] (office) [phone] (fax)

## 2018-12-17 NOTE — Progress Notes (Signed)
Gynecologic Oncology Follow-up Note: Gyn-Onc  Consult was requested by Dr. Alvy Bimler for the evaluation of Patty [name] 85 y.o. female  CC:  Chief Complaint  Patient presents with  . Granulosa cell tumor of ovary, unspecified laterality    Assessment/Plan:  [name]  is a 85 y.o.  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 Stable, asymptomatic disease on imaging. Slight increase in lung nodule.   Recommend continuing tamoxifen daily.   I will see Patty [name] back in 3 months with an Inhibin B. We will plan to repeat CT imaging in 6 months (May, 2021).   HPI: Patty [name] is an 85 year old woman who was originally seen in consultation at the request of Dr Alvy Bimler for granulosa cell ovarian cancer.   The patient's history began in approximately 2000 when she underwent ex lap, TAH, BSO with Dr Raylene Miyamoto at [facility]. She did not receive adjuvant chemotherapy. Postoperatively she had recurrences in the peritoneum in 2004, 2007 and 2011.  The 2004 surgery was with Dr Excell Seltzer and included resection of the recurrent tumor with small bowel resection and colectomy for diverticulosis. She has had a laparoscopic hernia repair with mesh in 2005. In 2007 she underwent resection of pelvic tumor recurrence with Dr Excell Seltzer and ventral hernia repair with mesh.  In 2011 Dr Excell Seltzer performed a laparoscopic resection of suprapubic tumor. Her last CT was in 2010.   She has significant CAD with 6 stents and a history of CHF. She has a pacemaker which she states has a nonfunctioning battery and she is not currently paced. This was placed for tachycadia-bradycardia syndrome. She is obese with OSA. She has atrial fibrillation and is on coumadin.   In October, 2018 she began noting intermittent right lower quadrant pains. Labs at that time showed:  CA 125 12/01/16: 35 Inhibin B 11/21/16:  119  CT abd/pelvis 12/09/16: A small suprapubic hernia is seen along the inferior margin of the surgical mass containing a loop of small bowel. No evidence of bowel obstruction or ischemia. Multiple new soft tissue masses are seen in the left suprapubic region and left lower quadrant, consistent with recurrent tumor. Largest in the left suprapubic region measures 3.8 x 2.8 cm, and 3.8 x 3.4 cm in left lower quadrant. There is also a new mass in the central small bowel mesenteric measuring 5.8 x 5.0 cm. These findings are consistent with recurrent carcinoma and peritoneal metastases.  She was provided with 3 options for proceeding with treatment of her recurrence:  1/ surgical resection and hernia repair. This will involve exploratory laparotomy, radical tumor debulking, possible bowel resection, possible partial cystectomy, and hernia repair with Dr Excell Seltzer and myself.  She will require medical clearance as she has significant cardiac issues. 2/ chemotherapy with carboplatin and paclitaxel. I discussed that this may not achieve as long a remission as surgical intervention, but would involve a large radical procedure.  3/ A third alternative was hormonal therapy with progestin and tamoxifen in rotation.  She was not sure about which route to pursue, and given that she was asymptomatic with this slow growing tumor, took some time to reflect. In December, 2018 she received replacement of her pacemaker and was placed on Plavix and coumadin. She met with her cardiologists, Dr [name] and Dr Irish Lack, who felt that she was a candidate for any of the approaches, however, would prefer until she was 1  year out from her last coronary catheterization procedure to be taken off anticoagulants.  CT abd/pelvis in February, 2019 showed essentially stable disease with some mild increases in tumor size, and some decreased, but no new lesions. As she was asymptomatic she elected for expectant management at that time with  plan for surgery after she was 1 year out from prior cardiac cath.  On 10/12/17 she was admitted for 5 days with intraperitoneal bleeding from her granulosa cell tumor masses. Her anticoagulant therapy was held as was her plavix and she was transfused. CT imaging on 10/16/17 showed the previously demonstrated 9.4 x 9.2 cm left anterior pelvic hemorrhage currently measures 9.5 x 7.3 cm and is less dense. More anteriorly located hemorrhage on the left previously measures 15.6 x 4.4 cm and currently measures 17.3 x 2.8 cm. A previously demonstrated 2.8 cm cystic and solid midline anterior peritoneal mass at the level of the upper pelvis measures 3.0 cm. The previously demonstrated 8.2 x 5.6 cm cystic and solid left pelvic mass currently measures 7.8 x 5.8 cm. There is less free peritoneal blood in the upper abdomen. Anterior hernia repair mesh is again demonstrated. A ventral hernia at the level of the inferior pelvis containing herniated small bowel without obstruction is again demonstrated. This also contains a small amount the urinary bladder.  Given the progression of her peritoneal disease on imaging, she was felt to be a better candidate for chemotherapy first with carboplatin and paclitaxel, followed by consideration for interval debulking if she demonstrates a good response.  10/29/17-03/30/18 she received single agent carboplatin x 7 doses. She was admitted for heart failure after cycle 1. She otherwise tolerated therapy well.  CT imaging showed response, and in March, 2020 showed progressive response: Multiple intraperitoneal implants, overall similar in size, number and distribution to prior study from 01/04/2018. Likewise, the right lower lobe pulmonary nodules also stable. No definite progression of disease on today's examination.  Inhibin B on 06/02/18: 150  At this point she was changed to anastrazole therapy from chemotherapy given her stable disease.  Aprel reported that she had taken only a  few days of anastazole due to intolerance (nausea).  Inhbin B from 09/01/18 119.4  CT abd/pelvis from 09/03/18 showed stable disease with slight decrease in one of the masses.  Interval Hx:  As she poorly tolerated anastrozole, when she was seen in August 2020 she was changed to Megace progestin therapy which was prescribed.  However she did not receive any of this medication as insurance would not cover it.  Therefore in September 2020 she was prescribed tamoxifen which she began daily.  She was admitted to hospital at least twice in the fall 2024 episodes related to cardiac issues.  These have now resolved and she has been placed on cardiac rehab at home as an outpatient is doing much better.  She reports an excellent quality of life excellent mobility, no pain, no hot flashes.  She is planning to travel to [place] to see her daughter and son-in-law for the holidays and stay there until January 2021.  She reported that she likes the current regimen she is on.  Inhibin B on December 09, 2018 was slightly increased at 166.3.    CT scan of the abdomen and pelvis on December 09, 2018 revealed very slight increase in size of a spiculated nodule in the right lung base adjacent to the right hemidiaphragm now measuring 12 mm (in December 2019 it had measured 10 mm).  The remainder  of the peritoneal masses were stable in size including the largest which was a 5.5 cm mass in the left lower quadrant.  No new sites of disease.  Current Meds:  Outpatient Encounter Medications as of 12/17/2018  Medication Sig  . acetaminophen (TYLENOL) 500 MG tablet Take 500 mg by mouth every 6 (six) hours as needed for mild pain or headache.   Marland Kitchen atorvastatin (LIPITOR) 80 MG tablet TAKE 1 TABLET BY MOUTH EVERY DAY IN THE MORNING (Patient taking differently: Take 80 mg by mouth daily. )  . beta carotene w/minerals (OCUVITE) tablet Take 1 tablet by mouth 2 (two) times daily.   . Calcium Carb-Cholecalciferol (CALTRATE  600+D) 600-800 MG-UNIT TABS Take 1 tablet by mouth every evening.   . clopidogrel (PLAVIX) 75 MG tablet Take 1 tablet (75 mg total) by mouth daily.  . Coenzyme Q-10 100 MG capsule Take 200 mg by mouth at bedtime.   . docusate sodium (COLACE) 100 MG capsule Take 100 mg by mouth daily as needed for mild constipation (IF NO B/M AFTER 2 DAYS).   Marland Kitchen dofetilide (TIKOSYN) 125 MCG capsule Take 1 capsule (125 mcg total) by mouth 2 (two) times daily.  . furosemide (LASIX) 40 MG tablet 2 tabs by mouth every morning. 2 additional tabs daily as needed for wt gain/abdominal distension. (Patient taking differently: Take 80 mg by mouth daily. )  . gabapentin (NEURONTIN) 300 MG capsule TAKE 1 CAPSULE BY MOUTH TWICE A DAY (Patient taking differently: Take 300 mg by mouth 2 (two) times daily. )  . isosorbide mononitrate (IMDUR) 60 MG 24 hr tablet Take 1 tablet (60 mg total) by mouth daily.  Marland Kitchen lidocaine-prilocaine (EMLA) cream Apply to PAC site 1-2 hours prior to access  . losartan (COZAAR) 25 MG tablet Take 1 tablet (25 mg total) by mouth daily.  . metoprolol (TOPROL-XL) 200 MG 24 hr tablet Take 1 tablet (200 mg total) by mouth daily. Take with or immediately following a meal.  . Multiple Vitamin (MULTIVITAMIN) capsule Take 1 capsule by mouth daily.  . nitroGLYCERIN (NITROSTAT) 0.4 MG SL tablet PLACE 1 TABLET (0.4 MG TOTAL) UNDER THE TONGUE EVERY 5 (FIVE) MINUTES AS NEEDED FOR CHEST PAIN (Patient taking differently: Place 0.4 mg under the tongue every 5 (five) minutes as needed for chest pain. )  . Omega-3 Fatty Acids (FISH OIL) 1200 MG CAPS Take 1,200 mg by mouth daily.   . potassium chloride SA (KLOR-CON M20) 20 MEQ tablet Take 1 tablet (20 mEq total) by mouth 2 (two) times daily.  Marland Kitchen spironolactone (ALDACTONE) 25 MG tablet Take 25 mg by mouth See admin instructions. Take 25 mg by mouth every day at 4 PM  . tamoxifen (NOLVADEX) 10 MG tablet Take 1 tablet (10 mg total) by mouth daily.  Marland Kitchen warfarin (COUMADIN) 2 MG tablet  TAKE AS DIRECTED BY COUMADIN CLINIC. (Patient taking differently: Take 1-2 mg by mouth See admin instructions. Taking 1/2 tablet on Monday, Wednesday and Friday Other days- 80m on Tues, Thur, Sat and Sunday)   No facility-administered encounter medications on file as of 12/17/2018.     Allergy:  Allergies  Allergen Reactions  . Bee Venom Anaphylaxis  . Other Nausea And Vomiting and Other (See Comments)    Pain medications cause severe vomiting. Tolerated slow IV morphine drip  . Oxycodone Hcl Nausea And Vomiting  . Amiodarone Nausea Only  . Prednisone Palpitations and Other (See Comments)    "Rapid Heart Beat"    Social Hx:   Social  History   Socioeconomic History  . Marital status: Widowed    Spouse name: Not on file  . Number of children: 4  . Years of education: Not on file  . Highest education level: Not on file  Occupational History  . Occupation: Retired Nurse, learning disability estate  Social Needs  . Financial resource strain: Not on file  . Food insecurity    Worry: Not on file    Inability: Not on file  . Transportation needs    Medical: Not on file    Non-medical: Not on file  Tobacco Use  . Smoking status: Former Smoker    Packs/day: 1.00    Years: 32.00    Pack years: 32.00    Types: Cigarettes    Quit date: 02/03/1974    Years since quitting: 44.8  . Smokeless tobacco: Never Used  Substance and Sexual Activity  . Alcohol use: Yes    Alcohol/week: 7.0 standard drinks    Types: 7 Glasses of wine per week    Comment: one glass wine nightly with dinner  . Drug use: No  . Sexual activity: Never  Lifestyle  . Physical activity    Days per week: Not on file    Minutes per session: Not on file  . Stress: Not on file  Relationships  . Social Herbalist on phone: Not on file    Gets together: Not on file    Attends religious service: Not on file    Active member of club or organization: Not on file    Attends meetings of clubs or organizations: Not  on file    Relationship status: Not on file  . Intimate partner violence    Fear of current or ex partner: Not on file    Emotionally abused: Not on file    Physically abused: Not on file    Forced sexual activity: Not on file  Other Topics Concern  . Not on file  Social History Narrative   Lives alone.    Past Surgical Hx:  Past Surgical History:  Procedure Laterality Date  . ABDOMINAL HYSTERECTOMY    . CARDIAC CATHETERIZATION  09/03/2007   EF 70%; Failed attempt at PCI to OM  . CARDIAC CATHETERIZATION  11/01/2003   EF 70%  . CARDIAC CATHETERIZATION N/A 12/14/2015   Procedure: Left Heart Cath and Coronary Angiography;  Surgeon: Burnell Blanks, MD;  Location: [place] [facility];  Service: Cardiovascular;  Laterality: N/A;  . CARDIOVERSION  12/31/2011   Procedure: CARDIOVERSION;  Surgeon: [name], MD;  Location: [facility] ENDOSCOPY;  Service: Cardiovascular;  Laterality: N/A;  . CARDIOVERSION N/A 12/31/2011   Procedure: CARDIOVERSION;  Surgeon: [name], MD;  Location: [facility] CATH LAB;  Service: Cardiovascular;  Laterality: N/A;  . CARDIOVERSION N/A 11/15/2018   Procedure: CARDIOVERSION;  Surgeon: Dorothy Spark, MD;  Location: [place];  Service: Cardiovascular;  Laterality: N/A;  . CATARACT EXTRACTION, BILATERAL  2015  . CHOLECYSTECTOMY  1980's  . COLON SURGERY  2004   colectomy for diverticulosis  . CORONARY ANGIOPLASTY WITH STENT PLACEMENT  2000    and 08/11/2012; 11/12/2012: 3 + 2 LAD & CFX; 2nd CFX stent 11/12/2012  . CORONARY ATHERECTOMY N/A 07/15/2016   Procedure: Coronary Atherectomy;  Surgeon: Martinique, Peter M, MD;  Location: [place] [facility];  Service: Cardiovascular;  Laterality: N/A;  . CORONARY BALLOON ANGIOPLASTY N/A 07/14/2016   Procedure: Coronary Balloon Angioplasty;  Surgeon: Martinique, Peter M, MD;  Location: [place] [facility];  Service: Cardiovascular;  Laterality: N/A;  . CORONARY STENT INTERVENTION N/A 07/15/2016   Procedure: Coronary  Stent Intervention;  Surgeon: Martinique, Peter M, MD;  Location: [place] [facility];  Service: Cardiovascular;  Laterality: N/A;  . FRACTIONAL FLOW RESERVE WIRE  10/07/2013   Procedure: [place];  Surgeon: [name], MD;  Location: [facility] CATH LAB;  Service: Cardiovascular;;  . HERNIA REPAIR  2005   "laparoscopic"  . IR IMAGING GUIDED PORT INSERTION  10/28/2017  . LEFT HEART CATH AND CORONARY ANGIOGRAPHY N/A 07/14/2016   Procedure: Left Heart Cath and Coronary Angiography;  Surgeon: Martinique, Peter M, MD;  Location: [place] [facility];  Service: Cardiovascular;  Laterality: N/A;  . LEFT HEART CATH AND CORONARY ANGIOGRAPHY N/A 11/10/2018   Procedure: LEFT HEART CATH AND CORONARY ANGIOGRAPHY;  Surgeon: Wellington Hampshire, MD;  Location: [place] [facility];  Service: Cardiovascular;  Laterality: N/A;  . LEFT HEART CATHETERIZATION WITH CORONARY ANGIOGRAM N/A 11/12/2012   Procedure: LEFT HEART CATHETERIZATION WITH CORONARY ANGIOGRAM;  Surgeon: [name], MD;  Location: [facility] CATH LAB;  Service: Cardiovascular;  Laterality: N/A;  . LEFT HEART CATHETERIZATION WITH CORONARY ANGIOGRAM N/A 10/07/2013   Procedure: LEFT HEART CATHETERIZATION WITH CORONARY ANGIOGRAM;  Surgeon: [name], MD;  Location: [facility] CATH LAB;  Service: Cardiovascular;  Laterality: N/A;  . LEFT HEART CATHETERIZATION WITH CORONARY ANGIOGRAM N/A 12/14/2013   Procedure: LEFT HEART CATHETERIZATION WITH CORONARY ANGIOGRAM;  Surgeon: Sinclair Grooms, MD;  Location: [facility] CATH LAB;  Service: Cardiovascular;  Laterality: N/A;  . LEFT HEART CATHETERIZATION WITH CORONARY ANGIOGRAM N/A 05/16/2014   Procedure: LEFT HEART CATHETERIZATION WITH CORONARY ANGIOGRAM;  Surgeon: Sherren Mocha, MD;  Location: [facility] CATH LAB;  Service: Cardiovascular;  Laterality: N/A;  . PERCUTANEOUS CORONARY INTERVENTION-BALLOON ONLY  08/04/2012   Procedure: PERCUTANEOUS CORONARY INTERVENTION-BALLOON ONLY;  Surgeon: [name], MD;  Location:  [facility] CATH LAB;  Service: Cardiovascular;;  . PERCUTANEOUS CORONARY ROTOBLATOR INTERVENTION (PCI-R) N/A 08/11/2012   Procedure: PERCUTANEOUS CORONARY ROTOBLATOR INTERVENTION (PCI-R);  Surgeon: [name], MD;  Location: [facility] CATH LAB;  Service: Cardiovascular;  Laterality: N/A;  . PERCUTANEOUS CORONARY STENT INTERVENTION (PCI-S)  10/07/2013   Procedure: PERCUTANEOUS CORONARY STENT INTERVENTION (PCI-S);  Surgeon: [name], MD;  Location: [facility] CATH LAB;  Service: Cardiovascular;;  . PERMANENT PACEMAKER INSERTION N/A 03/16/2012   [name] (SJM) leadless pacemaker (LEADLESS II STUDY PATEINT)  . SALIVARY GLAND SURGERY  2000's   "had a little lump removed; granulosa related; it was benign" (08/11/2012)  . TEE WITHOUT CARDIOVERSION  12/31/2011   Procedure: TRANSESOPHAGEAL ECHOCARDIOGRAM (TEE);  Surgeon: [name], MD;  Location: [facility] ENDOSCOPY;  Service: Cardiovascular;  Laterality: N/A;  . UMBILICAL GRANULOMA EXCISION  2000   2003; 2004; 2007: "all in my abdomen including small intestines, outside my ?uterus/etc" (08/11/2012)  . VARICOSE VEIN SURGERY Bilateral 1977    Past Medical Hx:  Past Medical History:  Diagnosis Date  . Bleeding behind the abdominal cavity 10/2017  . Chronic anticoagulation - coumadin, CHADS2VASC=6 05/17/2015  . CKD (chronic kidney disease), stage III   . Combined systolic and diastolic heart failure ([place])   . Coronary artery disease    a. s/p multiple stents - stenting x 2 to the LAD and x 1 to the LCx in 2000, rotational arthrectomy to proximal LCx and DES to LAD in 2014 and DES to LCx in 2014, along with DES to LAD for re-in-stent stenosis in  2015, and DES to ostial LAD in 2016. b. 07/2016 - orbital atherectomy & DES to mid LCx.  . Diverticulitis   . Granulosa cell carcinoma ([place])    abd; last episode was in 2009  . Hyperlipidemia   . Hypertension   . LBBB (left bundle branch block)   . Myocardial infarction ([place]) 2002  . Obesity   . OSA on CPAP   .  Ovarian ca ([place]) 2019  . Pacemaker failure    a. Prior leadless PPM with premature battery failure, being managed conservatively without replacement.  . Peripheral vascular disease ([place])   . Permanent atrial fibrillation ([place]) 2013  . Renal artery stenosis ([place])   . Tachycardia-bradycardia syndrome ([place])    a. s/p leadless pacemaker ([name]) implanted by Dr [name]  . Varicose veins     Past Gynecological History:  Granulosa cell tumor of the ovary No LMP recorded. Patient has had a hysterectomy.  Family Hx:  Family History  Problem Relation Age of Onset  . Stroke Mother   . Heart attack Father   . Diabetes Father   . Hypertension Father   . Heart attack Brother   . Diabetes Brother   . Hypertension Brother   . Kidney failure Brother     Review of Systems:  Constitutional  Feels fatigued    ENT Normal appearing ears and nares bilaterally Skin/Breast  No rash, sores, jaundice, itching, dryness Cardiovascular  No chest pain, shortness of breath, or edema  Pulmonary  No cough or wheeze.  Gastro Intestinal  No nausea, vomitting, or diarrhoea. No bright red blood per rectum, no abdominal pain, change in bowel movement, or constipation.  Genito Urinary  No frequency, urgency, dysuria, no pelvic pain Musculo Skeletal  No myalgia, arthralgia, joint swelling or pain  Neurologic  No weakness, change in gait, + neuropathy Psychology  No depression, anxiety, insomnia.   Vitals:  Blood pressure (!) 128/43, pulse 87, temperature 98.2 F (36.8 C), temperature source Temporal, resp. rate 16, height 5' (1.524 m), weight 174 lb 12.8 oz (79.3 kg), SpO2 98 %.  Physical Exam: WD in NAD Neck  Supple NROM, without any enlargements.  Lymph Node Survey No cervical supraclavicular or inguinal adenopathy Cardiovascular  Pulse normal rate, regularity and rhythm. S1 and S2 normal.  Lungs  Clear to auscultation bilateraly, without wheezes/crackles/rhonchi. Good air movement.  Skin   No rash/lesions/breakdown  Psychiatry  Alert and oriented to person, place, and time  Abdomen  Normoactive bowel sounds, abdomen soft, non-tender and obese. No palpable masses but the hernia is appreciated in the lower left abdomen.  Back No CVA tenderness Genito Urinary  No palpable masses on pelvic examination Rectal  deferred Extremities  No bilateral cyanosis, clubbing or edema.  Thereasa Solo, MD  12/17/2018, 2:57 PM

## 2018-12-17 NOTE — Patient Instructions (Signed)
Continue to take the Tamoxifen as scheduled.  If you run out of the prescription, please call Dr Serita Grit office at [phone] and she will arrange a refill.  Please notify Dr Denman George at phone number [phone] if you notice vaginal bleeding, new pelvic or abdominal pains, bloating, feeling full easy, or a change in bladder or bowel function.   Dr Denman George will see you again in February, approximately 1 week after a lab has been drawn for the tumor marker (Inhibin B).

## 2018-12-17 NOTE — Patient Instructions (Signed)
Description   Continue taking 1 tablet daily except 1/2 tablet on Mondays, Wednesdays and Fridays. Recheck INR in 3 weeks.  Main [phone]. Coumadin Clinic # [phone]

## 2018-12-24 ENCOUNTER — Telehealth (HOSPITAL_COMMUNITY): Payer: Self-pay | Admitting: Nurse Practitioner

## 2018-12-24 NOTE — Telephone Encounter (Signed)
Called and left message for patient to call back.  Per Dr. [name] need to schedule pt for f/u with Roderic Palau, NP after Christmas when pt returns from [place].

## 2018-12-26 ENCOUNTER — Telehealth: Payer: Self-pay | Admitting: Cardiology

## 2018-12-26 NOTE — Progress Notes (Signed)
Cardiology Office Note    Date:  12/27/2018   ID:  Patty [name], DOB 10/01/1930, MRN [id number]  PCP:  [name], MD  Primary Cardiologist: Larae Grooms, MD  Primary Electrophysiologist:  Thompson Grayer, MD   Chief Complaint: Hospital follow up   History of Present Illness:   Patty [name] is a 85 y.o. female with hx of CAD, S-D-CHF, LBBB, pre-DM, HTN, HLD, perm Afib w/ tachy/brady s/p [name] leadless PPM in 2014 (but w/ premature battery failure)>>STJ PPM in 2018, obesity, OSA on CPAP, ovarian CA 2019, CKD III presented for follow up.   She has significant CAD with stenting x 2 to the LAD and x 1 to the LCx in 2000, rotational arthrectomy to proximal LCx and DES to LAD in 2014 and DES to LCx in 2014, along with DES to LAD for -in-stent stenosis in 2015, and DES to ostial LAD in 2016. Last cath 07/14/16 showed 100% CTO of OM1 with L-L collaterals, 90% distal Cx, 100% distal PLOM, otherwise nonobstructive disease, normal LVEDP - unsuccessful PCI of the distal Cx due to inability to cross the lesion with a balloon. She was brought back 07/15/16 and underwent successful orbital atherectomy &DES to mid LCx.   STJ leadlessPPM implanted2070for tachy/brady (early battery depletion); STJ PPM implanted 2018.  Admitted September 2020 for acute CHF exacerbation after failing outpatient therapy.  Admitted October 2020 for chest pain concerning for unstable angina.  Troponin was elevated.  Cath showed stable anatomy, unchanged from prior cath. Elevated troponin is thought to be due to acute on chronic heart failure and afib with RVR.  Device interrogation showed increased A. fib burden from 50% to 100% since April.  Patient was evaluated by EP service and loaded with Tikosyn and underwent cardioversion to restore sinus rhythm.  Calcium channel blocker stopped and placed on beta-blocker.  Continue warfarin for anticoagulation.  Last seen in atrial fibrillation clinic November 2nd   2020.  She had some chest pain which improved with sublingual nitroglycerin.  Device interrogated and showed V rates in 90s and intermittent atrial tachycardia.  Patient was evaluated by Dr. [name] 12/17/2018 virtually.  Plan to monitor her symptoms.  Future plan would be discontinuation of Tikosyn versus AV nodal ablation with upgrade to CRT-P.  See call 12/26/2018 with slow heart rate intermittently in 40s.  She had already taken her metoprolol and Tikosyn.  Advised to monitor symptoms and follow-up on clinic.  Here today for follow up. She noted HR in 40 Saturday night. Last dose of Toprol XL 200mg  daily yesterday morning. Still HR in low 40s. She feels "Ok". No change in status. Denies chest pain, dizziness, orthopnea, PND, syncope or LE edema.    Past Medical History:  Diagnosis Date  . Bleeding behind the abdominal cavity 10/2017  . Chronic anticoagulation - coumadin, CHADS2VASC=6 05/17/2015  . CKD (chronic kidney disease), stage III   . Combined systolic and diastolic heart failure ([place])   . Coronary artery disease    a. s/p multiple stents - stenting x 2 to the LAD and x 1 to the LCx in 2000, rotational arthrectomy to proximal LCx and DES to LAD in 2014 and DES to LCx in 2014, along with DES to LAD for re-in-stent stenosis in 2015, and DES to ostial LAD in 2016. b. 07/2016 - orbital atherectomy & DES to mid LCx.  . Diverticulitis   . Granulosa cell carcinoma ([place])    abd; last episode was in 2009  . Hyperlipidemia   .  Hypertension   . LBBB (left bundle branch block)   . Myocardial infarction ([place]) 2002  . Obesity   . OSA on CPAP   . Ovarian ca ([place]) 2019  . Pacemaker failure    a. Prior leadless PPM with premature battery failure, being managed conservatively without replacement.  . Peripheral vascular disease ([place])   . Permanent atrial fibrillation ([place]) 2013  . Renal artery stenosis ([place])   . Tachycardia-bradycardia syndrome ([place])    a. s/p leadless pacemaker ([name])  implanted by Dr [name]  . Varicose veins     Past Surgical History:  Procedure Laterality Date  . ABDOMINAL HYSTERECTOMY    . CARDIAC CATHETERIZATION  09/03/2007   EF 70%; Failed attempt at PCI to OM  . CARDIAC CATHETERIZATION  11/01/2003   EF 70%  . CARDIAC CATHETERIZATION N/A 12/14/2015   Procedure: Left Heart Cath and Coronary Angiography;  Surgeon: Burnell Blanks, MD;  Location: [place] [facility] LAB;  Service: Cardiovascular;  Laterality: N/A;  . CARDIOVERSION  12/31/2011   Procedure: CARDIOVERSION;  Surgeon: [name], MD;  Location: [facility] ENDOSCOPY;  Service: Cardiovascular;  Laterality: N/A;  . CARDIOVERSION N/A 12/31/2011   Procedure: CARDIOVERSION;  Surgeon: [name], MD;  Location: [facility] CATH LAB;  Service: Cardiovascular;  Laterality: N/A;  . CARDIOVERSION N/A 11/15/2018   Procedure: CARDIOVERSION;  Surgeon: Dorothy Spark, MD;  Location: [place];  Service: Cardiovascular;  Laterality: N/A;  . CATARACT EXTRACTION, BILATERAL  2015  . CHOLECYSTECTOMY  1980's  . COLON SURGERY  2004   colectomy for diverticulosis  . CORONARY ANGIOPLASTY WITH STENT PLACEMENT  2000    and 08/11/2012; 11/12/2012: 3 + 2 LAD & CFX; 2nd CFX stent 11/12/2012  . CORONARY ATHERECTOMY N/A 07/15/2016   Procedure: Coronary Atherectomy;  Surgeon: Martinique, Peter M, MD;  Location: [place] [facility] LAB;  Service: Cardiovascular;  Laterality: N/A;  . CORONARY BALLOON ANGIOPLASTY N/A 07/14/2016   Procedure: Coronary Balloon Angioplasty;  Surgeon: Martinique, Peter M, MD;  Location: [name] [facility] LAB;  Service: Cardiovascular;  Laterality: N/A;  . CORONARY STENT INTERVENTION N/A 07/15/2016   Procedure: Coronary Stent Intervention;  Surgeon: Martinique, Peter M, MD;  Location: [place] [facility] LAB;  Service: Cardiovascular;  Laterality: N/A;  . FRACTIONAL FLOW RESERVE WIRE  10/07/2013   Procedure: [place];  Surgeon: [name], MD;  Location: [facility] CATH LAB;  Service:  Cardiovascular;;  . HERNIA REPAIR  2005   "laparoscopic"  . IR IMAGING GUIDED PORT INSERTION  10/28/2017  . LEFT HEART CATH AND CORONARY ANGIOGRAPHY N/A 07/14/2016   Procedure: Left Heart Cath and Coronary Angiography;  Surgeon: Martinique, Peter M, MD;  Location: [place] CV LAB;  Service: Cardiovascular;  Laterality: N/A;  . LEFT HEART CATH AND CORONARY ANGIOGRAPHY N/A 11/10/2018   Procedure: LEFT HEART CATH AND CORONARY ANGIOGRAPHY;  Surgeon: Wellington Hampshire, MD;  Location: [place] [facility] LAB;  Service: Cardiovascular;  Laterality: N/A;  . LEFT HEART CATHETERIZATION WITH CORONARY ANGIOGRAM N/A 11/12/2012   Procedure: LEFT HEART CATHETERIZATION WITH CORONARY ANGIOGRAM;  Surgeon: [name], MD;  Location: [facility] CATH LAB;  Service: Cardiovascular;  Laterality: N/A;  . LEFT HEART CATHETERIZATION WITH CORONARY ANGIOGRAM N/A 10/07/2013   Procedure: LEFT HEART CATHETERIZATION WITH CORONARY ANGIOGRAM;  Surgeon: [name], MD;  Location: [facility] CATH LAB;  Service: Cardiovascular;  Laterality: N/A;  . LEFT HEART CATHETERIZATION WITH CORONARY ANGIOGRAM N/A 12/14/2013   Procedure: LEFT HEART CATHETERIZATION WITH CORONARY ANGIOGRAM;  Surgeon: Sinclair Grooms, MD;  Location: [facility] CATH LAB;  Service: Cardiovascular;  Laterality: N/A;  . LEFT HEART CATHETERIZATION WITH CORONARY ANGIOGRAM N/A 05/16/2014   Procedure: LEFT HEART CATHETERIZATION WITH CORONARY ANGIOGRAM;  Surgeon: Sherren Mocha, MD;  Location: [facility] CATH LAB;  Service: Cardiovascular;  Laterality: N/A;  . PERCUTANEOUS CORONARY INTERVENTION-BALLOON ONLY  08/04/2012   Procedure: PERCUTANEOUS CORONARY INTERVENTION-BALLOON ONLY;  Surgeon: [name], MD;  Location: [facility] CATH LAB;  Service: Cardiovascular;;  . PERCUTANEOUS CORONARY ROTOBLATOR INTERVENTION (PCI-R) N/A 08/11/2012   Procedure: PERCUTANEOUS CORONARY ROTOBLATOR INTERVENTION (PCI-R);  Surgeon: [name], MD;  Location: [facility] CATH LAB;  Service: Cardiovascular;  Laterality:  N/A;  . PERCUTANEOUS CORONARY STENT INTERVENTION (PCI-S)  10/07/2013   Procedure: PERCUTANEOUS CORONARY STENT INTERVENTION (PCI-S);  Surgeon: [name], MD;  Location: [facility] CATH LAB;  Service: Cardiovascular;;  . PERMANENT PACEMAKER INSERTION N/A 03/16/2012   [name] (SJM) leadless pacemaker (LEADLESS II STUDY PATEINT)  . SALIVARY GLAND SURGERY  2000's   "had a little lump removed; granulosa related; it was benign" (08/11/2012)  . TEE WITHOUT CARDIOVERSION  12/31/2011   Procedure: TRANSESOPHAGEAL ECHOCARDIOGRAM (TEE);  Surgeon: [name], MD;  Location: [facility] ENDOSCOPY;  Service: Cardiovascular;  Laterality: N/A;  . UMBILICAL GRANULOMA EXCISION  2000   2003; 2004; 2007: "all in my abdomen including small intestines, outside my ?uterus/etc" (08/11/2012)  . VARICOSE VEIN SURGERY Bilateral 1977    Current Medications: Prior to Admission medications   Medication Sig Start Date End Date Taking? Authorizing Provider  acetaminophen (TYLENOL) 500 MG tablet Take 500 mg by mouth every 6 (six) hours as needed for mild pain or headache.     [provider]  atorvastatin (LIPITOR) 80 MG tablet TAKE 1 TABLET BY MOUTH EVERY DAY IN THE MORNING Patient taking differently: Take 80 mg by mouth daily.  04/07/18   [name], MD  beta carotene w/minerals (OCUVITE) tablet Take 1 tablet by mouth 2 (two) times daily.     [provider]  Calcium Carb-Cholecalciferol (CALTRATE 600+D) 600-800 MG-UNIT TABS Take 1 tablet by mouth every evening.     [provider]  clopidogrel (PLAVIX) 75 MG tablet Take 1 tablet (75 mg total) by mouth daily. 02/23/18   [name], MD  Coenzyme Q-10 100 MG capsule Take 200 mg by mouth at bedtime.     [provider]  docusate sodium (COLACE) 100 MG capsule Take 100 mg by mouth daily as needed for mild constipation (IF NO B/M AFTER 2 DAYS).     [provider]  dofetilide (TIKOSYN) 125 MCG capsule Take 1 capsule (125  mcg total) by mouth 2 (two) times daily. 12/16/18   Sherran Needs, NP  furosemide (LASIX) 40 MG tablet 2 tabs by mouth every morning. 2 additional tabs daily as needed for wt gain/abdominal distension. Patient taking differently: Take 80 mg by mouth daily.  10/09/18   Theora Gianotti, NP  gabapentin (NEURONTIN) 300 MG capsule TAKE 1 CAPSULE BY MOUTH TWICE A DAY Patient taking differently: Take 300 mg by mouth 2 (two) times daily.  08/02/18   Heath Lark, MD  isosorbide mononitrate (IMDUR) 60 MG 24 hr tablet Take 1 tablet (60 mg total) by mouth daily. 02/19/18   [name], MD  lidocaine-prilocaine (EMLA) cream Apply to [facility] site 1-2 hours prior to access 09/30/18   Cross, Melissa D, NP  losartan (COZAAR) 25 MG tablet Take 1 tablet (25 mg total) by mouth daily.  11/23/18   Sherran Needs, NP  metoprolol (TOPROL-XL) 200 MG 24 hr tablet Take 1 tablet (200 mg total) by mouth daily. Take with or immediately following a meal. 12/10/18   Sherran Needs, NP  Multiple Vitamin (MULTIVITAMIN) capsule Take 1 capsule by mouth daily.    [provider]  nitroGLYCERIN (NITROSTAT) 0.4 MG SL tablet PLACE 1 TABLET (0.4 MG TOTAL) UNDER THE TONGUE EVERY 5 (FIVE) MINUTES AS NEEDED FOR CHEST PAIN Patient taking differently: Place 0.4 mg under the tongue every 5 (five) minutes as needed for chest pain.  10/18/18   Allred, Jeneen Rinks, MD  Omega-3 Fatty Acids (FISH OIL) 1200 MG CAPS Take 1,200 mg by mouth daily.     [provider]  potassium chloride SA (KLOR-CON M20) 20 MEQ tablet Take 1 tablet (20 mEq total) by mouth 2 (two) times daily. 12/09/18   Sherran Needs, NP  spironolactone (ALDACTONE) 25 MG tablet Take 25 mg by mouth See admin instructions. Take 25 mg by mouth every day at 4 PM    [provider]  tamoxifen (NOLVADEX) 10 MG tablet Take 1 tablet (10 mg total) by mouth daily. 10/22/18   Joylene John D, NP  warfarin (COUMADIN) 2 MG tablet TAKE AS DIRECTED BY COUMADIN  CLINIC. Patient taking differently: Take 1-2 mg by mouth See admin instructions. Taking 1/2 tablet on Monday, Wednesday and Friday Other days- 2mg  on Harrell Lark, Sat and Sunday 11/01/18   [name], MD    Allergies:   Bee venom, Other, Oxycodone hcl, Amiodarone, and Prednisone   Social History   Socioeconomic History  . Marital status: Widowed    Spouse name: Not on file  . Number of children: 4  . Years of education: Not on file  . Highest education level: Not on file  Occupational History  . Occupation: Retired Nurse, learning disability estate  Social Needs  . Financial resource strain: Not on file  . Food insecurity    Worry: Not on file    Inability: Not on file  . Transportation needs    Medical: Not on file    Non-medical: Not on file  Tobacco Use  . Smoking status: Former Smoker    Packs/day: 1.00    Years: 32.00    Pack years: 32.00    Types: Cigarettes    Quit date: 02/03/1974    Years since quitting: 44.9  . Smokeless tobacco: Never Used  Substance and Sexual Activity  . Alcohol use: Yes    Alcohol/week: 7.0 standard drinks    Types: 7 Glasses of wine per week    Comment: one glass wine nightly with dinner  . Drug use: No  . Sexual activity: Never  Lifestyle  . Physical activity    Days per week: Not on file    Minutes per session: Not on file  . Stress: Not on file  Relationships  . Social Herbalist on phone: Not on file    Gets together: Not on file    Attends religious service: Not on file    Active member of club or organization: Not on file    Attends meetings of clubs or organizations: Not on file    Relationship status: Not on file  Other Topics Concern  . Not on file  Social History Narrative   Lives alone.     Family History:  The patient's family history includes Diabetes in her brother and father; Heart attack in her brother  and father; Hypertension in her brother and father; Kidney failure in her brother; Stroke in her  mother.   ROS:   Please see the history of present illness.    ROS All other systems reviewed and are negative.   PHYSICAL EXAM:   VS:  BP (!) 164/86   Pulse (!) 43   Ht 5' (1.524 m)   Wt 178 lb (80.7 kg)   SpO2 96%   BMI 34.76 kg/m    GEN: Well nourished, well developed, in no acute distress  HEENT: normal  Neck: no JVD, carotid bruits, or masses Cardiac: RRR; no murmurs, rubs, or gallops,no edema  Respiratory:  clear to auscultation bilaterally, normal work of breathing GI: soft, nontender, nondistended, + BS MS: no deformity or atrophy  Skin: warm and dry, no rash Neuro:  Alert and Oriented x 3, Strength and sensation are intact Psych: euthymic mood, full affect  Wt Readings from Last 3 Encounters:  12/27/18 178 lb (80.7 kg)  12/17/18 174 lb 12.8 oz (79.3 kg)  12/17/18 173 lb (78.5 kg)      Studies/Labs Reviewed:   EKG:  EKG is ordered today.  The ekg ordered today demonstrates A paced rhythm at 43 bpm  Recent Labs: 10/08/2018: TSH 2.089 10/09/2018: ALT 30 11/09/2018: B Natriuretic Peptide 664.9 11/14/2018: Hemoglobin 13.3; Platelets 212 12/06/2018: Magnesium 2.0 12/09/2018: BUN 23; Creatinine, Ser 1.07; Potassium 4.0; Sodium 143   Lipid Panel    Component Value Date/Time   CHOL 112 11/10/2018 0536   CHOL 132 10/08/2017 0905   TRIG 69 11/10/2018 0536   HDL 36 (L) 11/10/2018 0536   HDL 66 10/08/2017 0905   CHOLHDL 3.1 11/10/2018 0536   VLDL 14 11/10/2018 0536   LDLCALC 62 11/10/2018 0536   LDLCALC 52 10/08/2017 0905    Additional studies/ records that were reviewed today include:   Echocardiogram: 11/12/2018  1. Left ventricular ejection fraction, by visual estimation, is 40 to 45%. The left ventricle has moderately decreased function. Normal left ventricular size. There is mildly increased left ventricular hypertrophy.  2. Mid and apical inferior septum and apex are abnormal.  3. Global right ventricle has normal systolic function.The right ventricular size  is normal. No increase in right ventricular wall thickness.  4. Left atrial size was moderately dilated.  5. Right atrial size was normal.  6. The mitral valve is normal in structure. No evidence of mitral valve regurgitation. No evidence of mitral stenosis.  7. The tricuspid valve is normal in structure. Tricuspid valve regurgitation was not visualized by color flow Doppler.  8. The aortic valve is tricuspid Aortic valve regurgitation is trivial by color flow Doppler. Mild aortic valve sclerosis without stenosis.  9. The pulmonic valve was normal in structure. Pulmonic valve regurgitation is not visualized by color flow Doppler. 10. The inferior vena cava is normal in size with greater than 50% respiratory variability, suggesting right atrial pressure of 3 mmHg.  LEFT HEART CATH AND CORONARY ANGIOGRAPHY 11/10/2018  Conclusion    Ost LAD to Mid LAD lesion is 20% stenosed.  Mid LAD lesion is 30% stenosed.  Ost 2nd Mrg to 2nd Mrg lesion is 60% stenosed.  Ost 1st Mrg to 1st Mrg lesion is 100% stenosed.  Ost RPDA to RPDA lesion is 50% stenosed.  Ost Cx to Prox Cx lesion is 40% stenosed.  Prox Cx to Mid Cx lesion is 20% stenosed.  1st Mrg lesion is 100% stenosed.  Previously placed Dist Cx stent (unknown type) is widely  patent.  2nd Mrg lesion is 90% stenosed.  There is severe left ventricular systolic dysfunction.  LV end diastolic pressure is mildly elevated.  The left ventricular ejection fraction is less than 25% by visual estimate.  Ost RCA to Mid RCA lesion is 40% stenosed.  Mid RCA to Dist RCA lesion is 20% stenosed.   1.  Underlying three-vessel coronary artery disease with widely patent stents in the LAD, left circumflex and right coronary arteries.  OM1 is occluded with collaterals.  OM 2 is diffusely diseased and small.  There is moderate restenosis in the ostial left circumflex stent.  Mild to moderate restenosis in the RCA stents.  Ostial left circumflex stents  appears to have significant restenosis at the ostium in certain views but not in other views.  I reviewed previous cardiac catheterization and the appearance does not appear significantly different. 2.  Severely reduced LV systolic function with an EF of 20 to 25% with mildly elevated left ventricular end-diastolic pressure.  Recommendations: The patient was in A. fib with RVR during the procedure which made it difficult.  She was given 2 doses of IV metoprolol.  I suspect that her elevated troponin is likely due to supply demand ischemia in the setting of worsening cardiomyopathy and A. fib with RVR.  Recommend EP consultation to manage atrial fibrillation.  Resume heparin 8 hours after sheath pull.  I discontinued diltiazem given degree of cardiomyopathy.  Overall prognosis is poor given age and multiple comorbidities.     ASSESSMENT & PLAN:    1. Persistent atrial fibrillation / tachycardia  Now presenting with slow A paced rhythm at 40s despite setting of 60 (found on device check). Reviewed with DOD Dr. Caryl Comes and he has personally reviewed plan with the patient. Turned hysteresis. Continue Tikosyn and Toprol at current dose. On coumadin for anticoagulation. No bleeding issue.   2. CAD - Stable anatomy by cath 11/2018. Continue Plavix, satin and BB.   3. Chronic combined CHF - Euvolemic. Continue current therapy.   4. SSS s/p st. Jude PPM  - AS above  5. HTN - Elevated. Likely due to not taking Toprol today as directed. Resume home meds.   6. CKD III - Baseline Scr around 1.1-1.2  7. HLD - Complains mild leg cramp. Advised to cut back Lipitor to 1/2 tablet to 40mg  for 1-2 weeks and see response.   I have sept total 90-120 minutes taking care of her and reviewing with EP and discussing plan with the patient.   Medication Adjustments/Labs and Tests Ordered: Current medicines are reviewed at length with the patient today.  Concerns regarding medicines are outlined above.   Medication changes, Labs and Tests ordered today are listed in the Patient Instructions below. Patient Instructions  Medication Instructions:  Your physician has recommended you make the following change in your medication:  1.  RESTART Metoprolol TOMORROW, 12/28/2018   *If you need a refill on your cardiac medications before your next appointment, please call your pharmacy*  Lab Work: None ordered  If you have labs (blood work) drawn today and your tests are completely normal, you will receive your results only by: Marland Kitchen MyChart Message (if you have MyChart) OR . A paper copy in the mail If you have any lab test that is abnormal or we need to change your treatment, we will call you to review the results.  Testing/Procedures: None ordered  Follow-Up: At [facility], you and your health needs are our priority.  As part  of our continuing mission to provide you with exceptional heart care, we have created designated Provider Care Teams.  These Care Teams include your primary Cardiologist (physician) and Advanced Practice Providers (APPs -  Physician Assistants and Nurse Practitioners) who all work together to provide you with the care you need, when you need it.  Your next appointment:   Follow up with Dr. [name] as planned       Signed, Leanor Kail, [place]  12/27/2018 5:44 PM    [address], [place], [place]  [address] Phone: [phone]; Fax: [phone]

## 2018-12-26 NOTE — Telephone Encounter (Signed)
    Patient paged answering service regarding new lower heart rates.  Patient reports for the last 2 weeks heart rate has been sustaining in the 70s to 80s however she was feeling poorly yesterday and found that her BP was lower than normal with a heart rate of 49 bpm.  Patient states at 08 30 this morning retake on BP was 150/57 however heart rate has been sustaining in the 45 bpm range.  She is stable on the phone today.  Has no dizziness or syncope.  She is on Toprol XL 200 mg daily.  Unfortunately, patient has already taken this medication this morning.  Discussed holding of this tomorrow a.m.  She does have an appointment tomorrow at 3:30 PM.  Reinforced keeping this appointment.  Also follows with the A. fib clinic.  She is on Tikosyn 125 mcg twice daily.  No other AV blocking agents.  She is to stay well-hydrated and monitor her symptoms today.  If worse or HR drops below 40 bpm she is to recall the answering service and/or go to the ED for further evaluation.   Kathyrn Drown NP-C [place] Pager: [phone]

## 2018-12-27 ENCOUNTER — Other Ambulatory Visit: Payer: Self-pay

## 2018-12-27 ENCOUNTER — Ambulatory Visit (INDEPENDENT_AMBULATORY_CARE_PROVIDER_SITE_OTHER): Payer: Medicare Other | Admitting: Physician Assistant

## 2018-12-27 ENCOUNTER — Ambulatory Visit (INDEPENDENT_AMBULATORY_CARE_PROVIDER_SITE_OTHER): Payer: Medicare Other | Admitting: *Deleted

## 2018-12-27 ENCOUNTER — Encounter: Payer: Self-pay | Admitting: Physician Assistant

## 2018-12-27 VITALS — BP 164/86 | HR 43 | Ht 60.0 in | Wt 178.0 lb

## 2018-12-27 DIAGNOSIS — G4733 Obstructive sleep apnea (adult) (pediatric): Secondary | ICD-10-CM

## 2018-12-27 DIAGNOSIS — I495 Sick sinus syndrome: Secondary | ICD-10-CM

## 2018-12-27 DIAGNOSIS — I10 Essential (primary) hypertension: Secondary | ICD-10-CM | POA: Diagnosis not present

## 2018-12-27 DIAGNOSIS — I25118 Atherosclerotic heart disease of native coronary artery with other forms of angina pectoris: Secondary | ICD-10-CM

## 2018-12-27 DIAGNOSIS — I5042 Chronic combined systolic (congestive) and diastolic (congestive) heart failure: Secondary | ICD-10-CM

## 2018-12-27 DIAGNOSIS — N1831 Chronic kidney disease, stage 3a: Secondary | ICD-10-CM | POA: Diagnosis not present

## 2018-12-27 DIAGNOSIS — I2510 Atherosclerotic heart disease of native coronary artery without angina pectoris: Secondary | ICD-10-CM

## 2018-12-27 DIAGNOSIS — I4819 Other persistent atrial fibrillation: Secondary | ICD-10-CM

## 2018-12-27 NOTE — Patient Instructions (Signed)
Medication Instructions:  Your physician has recommended you make the following change in your medication:  1.  RESTART Metoprolol TOMORROW, 12/28/2018   *If you need a refill on your cardiac medications before your next appointment, please call your pharmacy*  Lab Work: None ordered  If you have labs (blood work) drawn today and your tests are completely normal, you will receive your results only by: Marland Kitchen MyChart Message (if you have MyChart) OR . A paper copy in the mail If you have any lab test that is abnormal or we need to change your treatment, we will call you to review the results.  Testing/Procedures: None ordered  Follow-Up: At [facility], you and your health needs are our priority.  As part of our continuing mission to provide you with exceptional heart care, we have created designated Provider Care Teams.  These Care Teams include your primary Cardiologist (physician) and Advanced Practice Providers (APPs -  Physician Assistants and Nurse Practitioners) who all work together to provide you with the care you need, when you need it.  Your next appointment:   Follow up with Dr. [name] as planned

## 2018-12-27 NOTE — Telephone Encounter (Signed)
Spoke with patient and she is aware of appt 02/23/2018 with Roderic Palau.  Pt states she will be out of town for the holidays until middle of January 2021.

## 2018-12-29 DIAGNOSIS — E785 Hyperlipidemia, unspecified: Secondary | ICD-10-CM | POA: Diagnosis not present

## 2018-12-29 DIAGNOSIS — I447 Left bundle-branch block, unspecified: Secondary | ICD-10-CM | POA: Diagnosis not present

## 2018-12-29 DIAGNOSIS — R7303 Prediabetes: Secondary | ICD-10-CM | POA: Diagnosis not present

## 2018-12-29 DIAGNOSIS — I701 Atherosclerosis of renal artery: Secondary | ICD-10-CM | POA: Diagnosis not present

## 2018-12-29 DIAGNOSIS — G893 Neoplasm related pain (acute) (chronic): Secondary | ICD-10-CM | POA: Diagnosis not present

## 2018-12-29 DIAGNOSIS — I080 Rheumatic disorders of both mitral and aortic valves: Secondary | ICD-10-CM | POA: Diagnosis not present

## 2018-12-29 DIAGNOSIS — C569 Malignant neoplasm of unspecified ovary: Secondary | ICD-10-CM | POA: Diagnosis not present

## 2018-12-29 DIAGNOSIS — I272 Pulmonary hypertension, unspecified: Secondary | ICD-10-CM | POA: Diagnosis not present

## 2018-12-29 DIAGNOSIS — I4821 Permanent atrial fibrillation: Secondary | ICD-10-CM | POA: Diagnosis not present

## 2018-12-29 DIAGNOSIS — D61818 Other pancytopenia: Secondary | ICD-10-CM | POA: Diagnosis not present

## 2018-12-29 DIAGNOSIS — D631 Anemia in chronic kidney disease: Secondary | ICD-10-CM | POA: Diagnosis not present

## 2018-12-29 DIAGNOSIS — B0229 Other postherpetic nervous system involvement: Secondary | ICD-10-CM | POA: Diagnosis not present

## 2018-12-29 DIAGNOSIS — I5043 Acute on chronic combined systolic (congestive) and diastolic (congestive) heart failure: Secondary | ICD-10-CM | POA: Diagnosis not present

## 2018-12-29 DIAGNOSIS — N183 Chronic kidney disease, stage 3 unspecified: Secondary | ICD-10-CM | POA: Diagnosis not present

## 2018-12-29 DIAGNOSIS — G4733 Obstructive sleep apnea (adult) (pediatric): Secondary | ICD-10-CM | POA: Diagnosis not present

## 2018-12-29 DIAGNOSIS — I739 Peripheral vascular disease, unspecified: Secondary | ICD-10-CM | POA: Diagnosis not present

## 2018-12-29 DIAGNOSIS — G620 Drug-induced polyneuropathy: Secondary | ICD-10-CM | POA: Diagnosis not present

## 2018-12-29 DIAGNOSIS — I4892 Unspecified atrial flutter: Secondary | ICD-10-CM | POA: Diagnosis not present

## 2018-12-29 DIAGNOSIS — D630 Anemia in neoplastic disease: Secondary | ICD-10-CM | POA: Diagnosis not present

## 2018-12-29 DIAGNOSIS — I110 Hypertensive heart disease with heart failure: Secondary | ICD-10-CM | POA: Diagnosis not present

## 2018-12-29 DIAGNOSIS — E440 Moderate protein-calorie malnutrition: Secondary | ICD-10-CM | POA: Diagnosis not present

## 2018-12-29 DIAGNOSIS — I495 Sick sinus syndrome: Secondary | ICD-10-CM | POA: Diagnosis not present

## 2018-12-29 DIAGNOSIS — I2510 Atherosclerotic heart disease of native coronary artery without angina pectoris: Secondary | ICD-10-CM | POA: Diagnosis not present

## 2018-12-29 DIAGNOSIS — I214 Non-ST elevation (NSTEMI) myocardial infarction: Secondary | ICD-10-CM | POA: Diagnosis not present

## 2018-12-29 DIAGNOSIS — K5909 Other constipation: Secondary | ICD-10-CM | POA: Diagnosis not present

## 2019-01-04 ENCOUNTER — Ambulatory Visit: Payer: Medicare Other | Admitting: Podiatry

## 2019-01-05 ENCOUNTER — Other Ambulatory Visit: Payer: Self-pay | Admitting: Physician Assistant

## 2019-01-05 DIAGNOSIS — Z5181 Encounter for therapeutic drug level monitoring: Secondary | ICD-10-CM | POA: Diagnosis not present

## 2019-01-06 ENCOUNTER — Telehealth: Payer: Self-pay | Admitting: Interventional Cardiology

## 2019-01-06 ENCOUNTER — Ambulatory Visit (INDEPENDENT_AMBULATORY_CARE_PROVIDER_SITE_OTHER): Payer: Medicare Other

## 2019-01-06 DIAGNOSIS — Z5181 Encounter for therapeutic drug level monitoring: Secondary | ICD-10-CM

## 2019-01-06 DIAGNOSIS — I4891 Unspecified atrial fibrillation: Secondary | ICD-10-CM

## 2019-01-06 LAB — PROTIME-INR
INR: 2.2 — ABNORMAL HIGH (ref 0.9–1.2)
Prothrombin Time: 23.2 s — ABNORMAL HIGH (ref 9.1–12.0)

## 2019-01-06 NOTE — Telephone Encounter (Signed)
Did not need this encounter °

## 2019-01-06 NOTE — Patient Instructions (Signed)
Description   Continue taking 1 tablet daily except 1/2 tablet on Mondays, Wednesdays and Fridays. Recheck INR in 4 weeks.  Main [phone]. Coumadin Clinic # [phone]

## 2019-01-19 LAB — CUP PACEART INCLINIC DEVICE CHECK
Battery Remaining Longevity: 138 mo
Battery Voltage: 3.01 V
Brady Statistic RA Percent Paced: 8.1 %
Brady Statistic RV Percent Paced: 0.85 %
Date Time Interrogation Session: 20201123163700
Implantable Lead Implant Date: 20181224
Implantable Lead Implant Date: 20181224
Implantable Lead Location: 753859
Implantable Lead Location: 753860
Implantable Pulse Generator Implant Date: 20181224
Lead Channel Impedance Value: 512.5 Ohm
Lead Channel Impedance Value: 800 Ohm
Lead Channel Pacing Threshold Amplitude: 0.5 V
Lead Channel Pacing Threshold Amplitude: 0.75 V
Lead Channel Pacing Threshold Pulse Width: 0.4 ms
Lead Channel Pacing Threshold Pulse Width: 0.4 ms
Lead Channel Sensing Intrinsic Amplitude: 4.7 mV
Lead Channel Sensing Intrinsic Amplitude: 5.6 mV
Lead Channel Setting Pacing Amplitude: 2.5 V
Lead Channel Setting Pacing Amplitude: 2.5 V
Lead Channel Setting Pacing Pulse Width: 0.4 ms
Lead Channel Setting Sensing Sensitivity: 2 mV
Pulse Gen Model: 2272
Pulse Gen Serial Number: 8968200

## 2019-01-19 NOTE — Progress Notes (Signed)
Pacemaker check in clinic due to c/o bradycardia and lack of energy that was noted at cardiology visit. Normal device function. Thresholds, sensing, impedances consistent with previous measurements. Device programmed to maximize longevity. ATAF burden 80% , 25 EGMs  available that show AT/AF. No high ventricular rates noted. Device programmed at appropriate safety margins. Histogram distribution appropriate for patient activity level. Device programmed to optimize intrinsic conduction. Dr Caryl Comes and industry consulted due to intermittent SB that falls below programmed lower rate. Hysteresis programmed off.  Estimated longevity  9 years 8 months. Patient enrolled in remote follow-up. Patient education completed.

## 2019-01-21 ENCOUNTER — Other Ambulatory Visit: Payer: Self-pay | Admitting: Interventional Cardiology

## 2019-01-21 ENCOUNTER — Telehealth: Payer: Self-pay | Admitting: Interventional Cardiology

## 2019-01-21 NOTE — Telephone Encounter (Signed)
New Message  Pt c/o medication issue:  1. Name of Medication: potassium chloride SA (KLOR-CON M20) 20 MEQ tablet  2. How are you currently taking this medication (dosage and times per day)? As written  3. Are you having a reaction (difficulty breathing--STAT)? No   4. What is your medication issue? Out of refills. Needs a new prescription. Out of medication. Need them ASAP  Send [id number] - [place], [place]

## 2019-01-21 NOTE — Telephone Encounter (Signed)
Refilled medication as requested.  Prescription submitted by pharmacy was not correct.  Pharmacy prescription had Pt taking potassium once a day.  Per review of Pt chart she is to take potassium 20 meq BID.  Corrected medication and submitted to pharmacy.

## 2019-02-02 ENCOUNTER — Other Ambulatory Visit: Payer: Self-pay | Admitting: Nurse Practitioner

## 2019-02-07 ENCOUNTER — Ambulatory Visit (INDEPENDENT_AMBULATORY_CARE_PROVIDER_SITE_OTHER): Payer: Medicare Other | Admitting: *Deleted

## 2019-02-07 ENCOUNTER — Other Ambulatory Visit: Payer: Self-pay | Admitting: Interventional Cardiology

## 2019-02-07 ENCOUNTER — Other Ambulatory Visit: Payer: Self-pay | Admitting: Physician Assistant

## 2019-02-07 DIAGNOSIS — I495 Sick sinus syndrome: Secondary | ICD-10-CM | POA: Diagnosis not present

## 2019-02-07 LAB — CUP PACEART REMOTE DEVICE CHECK
Date Time Interrogation Session: 20210104062544
Implantable Lead Implant Date: 20181224
Implantable Lead Implant Date: 20181224
Implantable Lead Location: 753859
Implantable Lead Location: 753860
Implantable Pulse Generator Implant Date: 20181224
Pulse Gen Model: 2272
Pulse Gen Serial Number: 8968200

## 2019-02-08 LAB — PT AND PTT
INR: 2.4 — ABNORMAL HIGH (ref 0.9–1.2)
Prothrombin Time: 25.1 s — ABNORMAL HIGH (ref 9.1–12.0)
aPTT: 34 s — ABNORMAL HIGH (ref 24–33)

## 2019-02-10 ENCOUNTER — Ambulatory Visit (INDEPENDENT_AMBULATORY_CARE_PROVIDER_SITE_OTHER): Payer: Medicare Other | Admitting: Cardiovascular Disease

## 2019-02-10 DIAGNOSIS — Z5181 Encounter for therapeutic drug level monitoring: Secondary | ICD-10-CM | POA: Diagnosis not present

## 2019-02-10 DIAGNOSIS — I4891 Unspecified atrial fibrillation: Secondary | ICD-10-CM

## 2019-02-10 NOTE — Patient Instructions (Signed)
Description   Left message for pt. Continue taking 1 tablet daily except 1/2 tablet on Mondays, Wednesdays and Fridays. Recheck INR in 6 weeks.  Main [phone]. Coumadin Clinic # [phone]

## 2019-02-11 ENCOUNTER — Telehealth: Payer: Self-pay | Admitting: Interventional Cardiology

## 2019-02-11 NOTE — Telephone Encounter (Signed)
Patient states she had her coumadin appointment in [place], [place] and has not gotten her results. She states they faxed them over and would like to get her INR numbers. She says she is leaving tomorrow for [place], but would like to know her results before then.

## 2019-02-11 NOTE — Telephone Encounter (Signed)
Looks like you tried to reach out to her yesterday

## 2019-02-11 NOTE — Telephone Encounter (Signed)
Opened anti coag encounter yesterday. Attempted calling her today as well.

## 2019-02-17 ENCOUNTER — Telehealth: Payer: Self-pay | Admitting: *Deleted

## 2019-02-17 NOTE — Telephone Encounter (Signed)
Patient called and confirmed her appts for February. Patient has not had her port flushed since November. Patient scheduled for a lab appt on 2/12. Moved lab appt to 1/25 and scheduled port flush

## 2019-02-24 ENCOUNTER — Other Ambulatory Visit: Payer: Self-pay

## 2019-02-24 ENCOUNTER — Encounter (HOSPITAL_COMMUNITY): Payer: Self-pay | Admitting: Nurse Practitioner

## 2019-02-24 ENCOUNTER — Ambulatory Visit (HOSPITAL_COMMUNITY)
Admission: RE | Admit: 2019-02-24 | Discharge: 2019-02-24 | Disposition: A | Discharge status: Home / Self Care | Payer: Medicare Other | Source: Ambulatory Visit | Attending: Nurse Practitioner | Admitting: Nurse Practitioner

## 2019-02-24 VITALS — BP 120/50 | HR 70 | Ht 60.0 in | Wt 175.4 lb

## 2019-02-24 DIAGNOSIS — Z9842 Cataract extraction status, left eye: Secondary | ICD-10-CM | POA: Diagnosis not present

## 2019-02-24 DIAGNOSIS — I4821 Permanent atrial fibrillation: Secondary | ICD-10-CM | POA: Insufficient documentation

## 2019-02-24 DIAGNOSIS — I739 Peripheral vascular disease, unspecified: Secondary | ICD-10-CM | POA: Insufficient documentation

## 2019-02-24 DIAGNOSIS — I701 Atherosclerosis of renal artery: Secondary | ICD-10-CM | POA: Diagnosis not present

## 2019-02-24 DIAGNOSIS — Z90710 Acquired absence of both cervix and uterus: Secondary | ICD-10-CM | POA: Diagnosis not present

## 2019-02-24 DIAGNOSIS — I4819 Other persistent atrial fibrillation: Secondary | ICD-10-CM | POA: Diagnosis not present

## 2019-02-24 DIAGNOSIS — Z79899 Other long term (current) drug therapy: Secondary | ICD-10-CM | POA: Insufficient documentation

## 2019-02-24 DIAGNOSIS — I130 Hypertensive heart and chronic kidney disease with heart failure and stage 1 through stage 4 chronic kidney disease, or unspecified chronic kidney disease: Secondary | ICD-10-CM | POA: Insufficient documentation

## 2019-02-24 DIAGNOSIS — Z885 Allergy status to narcotic agent status: Secondary | ICD-10-CM | POA: Insufficient documentation

## 2019-02-24 DIAGNOSIS — E785 Hyperlipidemia, unspecified: Secondary | ICD-10-CM | POA: Diagnosis not present

## 2019-02-24 DIAGNOSIS — D6869 Other thrombophilia: Secondary | ICD-10-CM | POA: Diagnosis not present

## 2019-02-24 DIAGNOSIS — Z833 Family history of diabetes mellitus: Secondary | ICD-10-CM | POA: Insufficient documentation

## 2019-02-24 DIAGNOSIS — Z8543 Personal history of malignant neoplasm of ovary: Secondary | ICD-10-CM | POA: Insufficient documentation

## 2019-02-24 DIAGNOSIS — Z7901 Long term (current) use of anticoagulants: Secondary | ICD-10-CM | POA: Insufficient documentation

## 2019-02-24 DIAGNOSIS — G4733 Obstructive sleep apnea (adult) (pediatric): Secondary | ICD-10-CM | POA: Insufficient documentation

## 2019-02-24 DIAGNOSIS — Z6834 Body mass index (BMI) 34.0-34.9, adult: Secondary | ICD-10-CM | POA: Insufficient documentation

## 2019-02-24 DIAGNOSIS — Z823 Family history of stroke: Secondary | ICD-10-CM | POA: Insufficient documentation

## 2019-02-24 DIAGNOSIS — Z7902 Long term (current) use of antithrombotics/antiplatelets: Secondary | ICD-10-CM | POA: Diagnosis not present

## 2019-02-24 DIAGNOSIS — I5042 Chronic combined systolic (congestive) and diastolic (congestive) heart failure: Secondary | ICD-10-CM | POA: Diagnosis not present

## 2019-02-24 DIAGNOSIS — Z9049 Acquired absence of other specified parts of digestive tract: Secondary | ICD-10-CM | POA: Insufficient documentation

## 2019-02-24 DIAGNOSIS — I495 Sick sinus syndrome: Secondary | ICD-10-CM | POA: Insufficient documentation

## 2019-02-24 DIAGNOSIS — Z91030 Bee allergy status: Secondary | ICD-10-CM | POA: Insufficient documentation

## 2019-02-24 DIAGNOSIS — Z9841 Cataract extraction status, right eye: Secondary | ICD-10-CM | POA: Diagnosis not present

## 2019-02-24 DIAGNOSIS — Z8249 Family history of ischemic heart disease and other diseases of the circulatory system: Secondary | ICD-10-CM | POA: Insufficient documentation

## 2019-02-24 DIAGNOSIS — I252 Old myocardial infarction: Secondary | ICD-10-CM | POA: Insufficient documentation

## 2019-02-24 DIAGNOSIS — E669 Obesity, unspecified: Secondary | ICD-10-CM | POA: Insufficient documentation

## 2019-02-24 DIAGNOSIS — Z882 Allergy status to sulfonamides status: Secondary | ICD-10-CM | POA: Insufficient documentation

## 2019-02-24 DIAGNOSIS — Z888 Allergy status to other drugs, medicaments and biological substances status: Secondary | ICD-10-CM | POA: Insufficient documentation

## 2019-02-24 DIAGNOSIS — N183 Chronic kidney disease, stage 3 unspecified: Secondary | ICD-10-CM | POA: Diagnosis not present

## 2019-02-24 DIAGNOSIS — I4892 Unspecified atrial flutter: Secondary | ICD-10-CM | POA: Diagnosis not present

## 2019-02-24 DIAGNOSIS — Z950 Presence of cardiac pacemaker: Secondary | ICD-10-CM | POA: Insufficient documentation

## 2019-02-24 DIAGNOSIS — I447 Left bundle-branch block, unspecified: Secondary | ICD-10-CM | POA: Insufficient documentation

## 2019-02-24 DIAGNOSIS — Z87891 Personal history of nicotine dependence: Secondary | ICD-10-CM | POA: Insufficient documentation

## 2019-02-24 DIAGNOSIS — I2510 Atherosclerotic heart disease of native coronary artery without angina pectoris: Secondary | ICD-10-CM | POA: Insufficient documentation

## 2019-02-24 DIAGNOSIS — Z841 Family history of disorders of kidney and ureter: Secondary | ICD-10-CM | POA: Insufficient documentation

## 2019-02-24 NOTE — Progress Notes (Signed)
Primary Care Physician: [name], MD Referring Physician: Dr. Aurea Graff [name] [name] is a 86 y.o. female with a h/o permanent afib, CAD, HTN, HLD, LBBB, tachy-brady with w/ PPM, OSA, that presented to [facility] with afib with RVR, and HF symptoms.  She was diuresed, LHC with stable disease, NSTEMI thought to be 2/2 demand ischemia forn RVR.CCB was stopped and she was transitioned to BB for worsening EF to 40-45%. Her RVR persisted despite aggressive rate control and it was decided to start tikosyn. DCCV restored SR, but had ERAF/atial tach.   In the afib clinic today she feels improved. Fluid status stable, ekg appears to be Sinus rhythm( a paced). Taking tikosyn appropriately. Continues on warfarin with a CHA2DS2VASc score of  at least 6. She hopes to spend Thanksgiving to Christmas with her daughter/son-in-law in the [place] area.   F/u in afib clinic, 10/29.  She called as she started having some chest discomfort and felt the need to take a NTG x 4 in the last 48 hours. Report sent to device clinic yesterday showed atrial flutter. She is not having chest pain today. Discussed with Dr. [name] and he suggested to try to pace terminate. Aaron Edelman with [employer] was asked to come to clinic to  do this.. It was not successful. He felt the pt was in an atrial tachycardia at atrial rate of 200 bpm. He discussed with Dr. [name] and it was decided to mode change from DDD to DDI 2/2 atrial undersensing which ultimately lead to ventricular pacing at 120 bpm( this is thought to have triggered her chest discomfort as replicated in the office today). Her lower rate was reduced to 40 BPM, which her intrinsic presently is 90 bpm.   F/u clinic 12/06/18. She is interrogated today and remains in atrial tach with v rate in the nineties. She  does not seem to be having as high of v rates as last week with 85% of time v rate in the 90's. Atrial rate around 200 bpm. She still reports some chest pressure in the am. She will  rest and take a NTG s/l and will feel a need to take another one around bedtime. She continues with her busy daytime routine in between.  I can not correlate  if chest pressure is  with higher v rates.   F/u in afib clinic, 02/15/19. She just got back from spending 6 weeks with her daughter and son in law in [place] over the holidays. She had a very good visit. She  only had to take 2 NTG while there. She has  not had any heart racing in a while.  EKG today show a paced rhythm. Continues on warfarin with a CHA2DS2VASc score of at least 6.   Today, she denies symptoms of palpitations, chest pain, shortness of breath, orthopnea, PND, lower extremity edema, dizziness, presyncope, syncope, or neurologic sequela. The patient is tolerating medications without difficulties and is otherwise without complaint today.   Past Medical History:  Diagnosis Date  . Bleeding behind the abdominal cavity 10/2017  . Chronic anticoagulation - coumadin, CHADS2VASC=6 05/17/2015  . CKD (chronic kidney disease), stage III   . Combined systolic and diastolic heart failure ([place])   . Coronary artery disease    a. s/p multiple stents - stenting x 2 to the LAD and x 1 to the LCx in 2000, rotational arthrectomy to proximal LCx and DES to LAD in 2014 and DES to LCx in 2014, along with DES  to LAD for re-in-stent stenosis in 2015, and DES to ostial LAD in 2016. b. 07/2016 - orbital atherectomy & DES to mid LCx.  . Diverticulitis   . Granulosa cell carcinoma ([place])    abd; last episode was in 2009  . Hyperlipidemia   . Hypertension   . LBBB (left bundle branch block)   . Myocardial infarction ([place]) 2002  . Obesity   . OSA on CPAP   . Ovarian ca ([place]) 2019  . Pacemaker failure    a. Prior leadless PPM with premature battery failure, being managed conservatively without replacement.  . Peripheral vascular disease ([place])   . Permanent atrial fibrillation ([place]) 2013  . Renal artery stenosis ([place])   . Tachycardia-bradycardia  syndrome ([place])    a. s/p leadless pacemaker ([name]) implanted by Dr [name]  . Varicose veins    Past Surgical History:  Procedure Laterality Date  . ABDOMINAL HYSTERECTOMY    . CARDIAC CATHETERIZATION  09/03/2007   EF 70%; Failed attempt at PCI to OM  . CARDIAC CATHETERIZATION  11/01/2003   EF 70%  . CARDIAC CATHETERIZATION N/A 12/14/2015   Procedure: Left Heart Cath and Coronary Angiography;  Surgeon: Burnell Blanks, MD;  Location: [place] CV LAB;  Service: Cardiovascular;  Laterality: N/A;  . CARDIOVERSION  12/31/2011   Procedure: CARDIOVERSION;  Surgeon: [name], MD;  Location: [facility] ENDOSCOPY;  Service: Cardiovascular;  Laterality: N/A;  . CARDIOVERSION N/A 12/31/2011   Procedure: CARDIOVERSION;  Surgeon: [name], MD;  Location: [facility] CATH LAB;  Service: Cardiovascular;  Laterality: N/A;  . CARDIOVERSION N/A 11/15/2018   Procedure: CARDIOVERSION;  Surgeon: Dorothy Spark, MD;  Location: [place];  Service: Cardiovascular;  Laterality: N/A;  . CATARACT EXTRACTION, BILATERAL  2015  . CHOLECYSTECTOMY  1980's  . COLON SURGERY  2004   colectomy for diverticulosis  . CORONARY ANGIOPLASTY WITH STENT PLACEMENT  2000    and 08/11/2012; 11/12/2012: 3 + 2 LAD & CFX; 2nd CFX stent 11/12/2012  . CORONARY ATHERECTOMY N/A 07/15/2016   Procedure: Coronary Atherectomy;  Surgeon: Martinique, Peter M, MD;  Location: [place] CV LAB;  Service: Cardiovascular;  Laterality: N/A;  . CORONARY BALLOON ANGIOPLASTY N/A 07/14/2016   Procedure: Coronary Balloon Angioplasty;  Surgeon: Martinique, Peter M, MD;  Location: [place] CV LAB;  Service: Cardiovascular;  Laterality: N/A;  . CORONARY STENT INTERVENTION N/A 07/15/2016   Procedure: Coronary Stent Intervention;  Surgeon: Martinique, Peter M, MD;  Location: [name] CV LAB;  Service: Cardiovascular;  Laterality: N/A;  . FRACTIONAL FLOW RESERVE WIRE  10/07/2013   Procedure: [place];  Surgeon: [name], MD;  Location: [facility] CATH LAB;  Service: Cardiovascular;;  . HERNIA REPAIR  2005   "laparoscopic"  . IR IMAGING GUIDED PORT INSERTION  10/28/2017  . LEFT HEART CATH AND CORONARY ANGIOGRAPHY N/A 07/14/2016   Procedure: Left Heart Cath and Coronary Angiography;  Surgeon: Martinique, Peter M, MD;  Location: [place] CV LAB;  Service: Cardiovascular;  Laterality: N/A;  . LEFT HEART CATH AND CORONARY ANGIOGRAPHY N/A 11/10/2018   Procedure: LEFT HEART CATH AND CORONARY ANGIOGRAPHY;  Surgeon: Wellington Hampshire, MD;  Location: [place] CV LAB;  Service: Cardiovascular;  Laterality: N/A;  . LEFT HEART CATHETERIZATION WITH CORONARY ANGIOGRAM N/A 11/12/2012   Procedure: LEFT HEART CATHETERIZATION WITH CORONARY ANGIOGRAM;  Surgeon: [name], MD;  Location: [facility] CATH LAB;  Service: Cardiovascular;  Laterality: N/A;  . LEFT HEART CATHETERIZATION WITH CORONARY ANGIOGRAM  N/A 10/07/2013   Procedure: LEFT HEART CATHETERIZATION WITH CORONARY ANGIOGRAM;  Surgeon: [name], MD;  Location: [facility] CATH LAB;  Service: Cardiovascular;  Laterality: N/A;  . LEFT HEART CATHETERIZATION WITH CORONARY ANGIOGRAM N/A 12/14/2013   Procedure: LEFT HEART CATHETERIZATION WITH CORONARY ANGIOGRAM;  Surgeon: Sinclair Grooms, MD;  Location: [facility] CATH LAB;  Service: Cardiovascular;  Laterality: N/A;  . LEFT HEART CATHETERIZATION WITH CORONARY ANGIOGRAM N/A 05/16/2014   Procedure: LEFT HEART CATHETERIZATION WITH CORONARY ANGIOGRAM;  Surgeon: Sherren Mocha, MD;  Location: [facility] CATH LAB;  Service: Cardiovascular;  Laterality: N/A;  . PERCUTANEOUS CORONARY INTERVENTION-BALLOON ONLY  08/04/2012   Procedure: PERCUTANEOUS CORONARY INTERVENTION-BALLOON ONLY;  Surgeon: [name], MD;  Location: [facility] CATH LAB;  Service: Cardiovascular;;  . PERCUTANEOUS CORONARY ROTOBLATOR INTERVENTION (PCI-R) N/A 08/11/2012   Procedure: PERCUTANEOUS CORONARY ROTOBLATOR INTERVENTION (PCI-R);  Surgeon: [name], MD;  Location: [facility] CATH  LAB;  Service: Cardiovascular;  Laterality: N/A;  . PERCUTANEOUS CORONARY STENT INTERVENTION (PCI-S)  10/07/2013   Procedure: PERCUTANEOUS CORONARY STENT INTERVENTION (PCI-S);  Surgeon: [name], MD;  Location: [facility] CATH LAB;  Service: Cardiovascular;;  . PERMANENT PACEMAKER INSERTION N/A 03/16/2012   [name] (SJM) leadless pacemaker (LEADLESS II STUDY PATEINT)  . SALIVARY GLAND SURGERY  2000's   "had a little lump removed; granulosa related; it was benign" (08/11/2012)  . TEE WITHOUT CARDIOVERSION  12/31/2011   Procedure: TRANSESOPHAGEAL ECHOCARDIOGRAM (TEE);  Surgeon: [name], MD;  Location: [facility] ENDOSCOPY;  Service: Cardiovascular;  Laterality: N/A;  . UMBILICAL GRANULOMA EXCISION  2000   2003; 2004; 2007: "all in my abdomen including small intestines, outside my ?uterus/etc" (08/11/2012)  . VARICOSE VEIN SURGERY Bilateral 1977    Current Outpatient Medications  Medication Sig Dispense Refill  . acetaminophen (TYLENOL) 500 MG tablet Take 500 mg by mouth every 6 (six) hours as needed for mild pain or headache.     Marland Kitchen atorvastatin (LIPITOR) 80 MG tablet TAKE 1 TABLET BY MOUTH EVERY DAY IN THE MORNING 90 tablet 3  . beta carotene w/minerals (OCUVITE) tablet Take 1 tablet by mouth 2 (two) times daily.     . Calcium Carb-Cholecalciferol (CALTRATE 600+D) 600-800 MG-UNIT TABS Take 1 tablet by mouth every evening.     . clopidogrel (PLAVIX) 75 MG tablet Take 1 tablet (75 mg total) by mouth daily. 90 tablet 3  . Coenzyme Q-10 100 MG capsule Take 200 mg by mouth at bedtime.     . docusate sodium (COLACE) 100 MG capsule Take 100 mg by mouth daily as needed for mild constipation (IF NO B/M AFTER 2 DAYS).     Marland Kitchen dofetilide (TIKOSYN) 125 MCG capsule Take 1 capsule (125 mcg total) by mouth 2 (two) times daily. 60 capsule 6  . furosemide (LASIX) 40 MG tablet 2 tabs by mouth every morning. 2 additional tabs daily as needed for wt gain/abdominal distension. 90 tablet 3  . gabapentin (NEURONTIN)  300 MG capsule TAKE 1 CAPSULE BY MOUTH TWICE A DAY 60 capsule 8  . isosorbide mononitrate (IMDUR) 60 MG 24 hr tablet TAKE 1 TABLET BY MOUTH EVERY DAY 30 tablet 11  . lidocaine-prilocaine (EMLA) cream Apply to PAC site 1-2 hours prior to access 30 g 1  . losartan (COZAAR) 25 MG tablet Take 1 tablet (25 mg total) by mouth daily. 30 tablet 3  . metoprolol (TOPROL-XL) 200 MG 24 hr tablet Take 1 tablet (200 mg total) by mouth daily. Take with or immediately following a meal. 30 tablet  6  . Multiple Vitamin (MULTIVITAMIN) capsule Take 1 capsule by mouth daily.    . nitroGLYCERIN (NITROSTAT) 0.4 MG SL tablet PLACE 1 TABLET (0.4 MG TOTAL) UNDER THE TONGUE EVERY 5 (FIVE) MINUTES AS NEEDED FOR CHEST PAIN 75 tablet 2  . Omega-3 Fatty Acids (FISH OIL) 1200 MG CAPS Take 1,200 mg by mouth daily.     . potassium chloride SA (KLOR-CON) 20 MEQ tablet Take 1 tablet (20 mEq total) by mouth 2 (two) times daily. 180 tablet 3  . tamoxifen (NOLVADEX) 10 MG tablet Take 1 tablet (10 mg total) by mouth daily. 30 tablet 3  . warfarin (COUMADIN) 2 MG tablet TAKE AS DIRECTED BY COUMADIN CLINIC. 30 tablet 3   No current facility-administered medications for this encounter.    Allergies  Allergen Reactions  . Bee Venom Anaphylaxis  . Other Nausea And Vomiting and Other (See Comments)    Pain medications cause severe vomiting. Tolerated slow IV morphine drip  . Oxycodone Hcl Nausea And Vomiting  . Amiodarone Nausea Only  . Prednisone Palpitations and Other (See Comments)    "Rapid Heart Beat"    Social History   Socioeconomic History  . Marital status: Widowed    Spouse name: Not on file  . Number of children: 4  . Years of education: Not on file  . Highest education level: Not on file  Occupational History  . Occupation: Retired Nurse, learning disability estate  Tobacco Use  . Smoking status: Former Smoker    Packs/day: 1.00    Years: 32.00    Pack years: 32.00    Types: Cigarettes    Quit date: 02/03/1974    Years  since quitting: 45.0  . Smokeless tobacco: Never Used  Substance and Sexual Activity  . Alcohol use: Yes    Alcohol/week: 7.0 standard drinks    Types: 7 Glasses of wine per week    Comment: one glass wine nightly with dinner  . Drug use: No  . Sexual activity: Never  Other Topics Concern  . Not on file  Social History Narrative   Lives alone.   Social Determinants of Health   Financial Resource Strain:   . Difficulty of Paying Living Expenses: Not on file  Food Insecurity:   . Worried About Charity fundraiser in the Last Year: Not on file  . Ran Out of Food in the Last Year: Not on file  Transportation Needs:   . Lack of Transportation (Medical): Not on file  . Lack of Transportation (Non-Medical): Not on file  Physical Activity:   . Days of Exercise per Week: Not on file  . Minutes of Exercise per Session: Not on file  Stress:   . Feeling of Stress : Not on file  Social Connections:   . Frequency of Communication with Friends and Family: Not on file  . Frequency of Social Gatherings with Friends and Family: Not on file  . Attends Religious Services: Not on file  . Active Member of Clubs or Organizations: Not on file  . Attends Archivist Meetings: Not on file  . Marital Status: Not on file  Intimate Partner Violence:   . Fear of Current or Ex-Partner: Not on file  . Emotionally Abused: Not on file  . Physically Abused: Not on file  . Sexually Abused: Not on file    Family History  Problem Relation Age of Onset  . Stroke Mother   . Heart attack Father   . Diabetes Father   .  Hypertension Father   . Heart attack Brother   . Diabetes Brother   . Hypertension Brother   . Kidney failure Brother     ROS- All systems are reviewed and negative except as per the HPI above  Physical Exam: There were no vitals filed for this visit. Wt Readings from Last 3 Encounters:  12/27/18 80.7 kg  12/17/18 79.3 kg  12/17/18 78.5 kg    Labs: Lab Results    Component Value Date   NA 143 12/09/2018   K 4.0 12/09/2018   CL 103 12/09/2018   CO2 30 12/09/2018   GLUCOSE 80 12/09/2018   BUN 23 12/09/2018   CREATININE 1.07 (H) 12/09/2018   CALCIUM 9.3 12/09/2018   PHOS 3.7 06/08/2010   MG 2.0 12/06/2018   Lab Results  Component Value Date   INR 2.4 (H) 02/07/2019   Lab Results  Component Value Date   CHOL 112 11/10/2018   HDL 36 (L) 11/10/2018   LDLCALC 62 11/10/2018   TRIG 69 11/10/2018     GEN- The patient is well appearing, alert and oriented x 3 today.   Head- normocephalic, atraumatic Eyes-  Sclera clear, conjunctiva pink Ears- hearing intact Oropharynx- clear Neck- supple, no JVP Lymph- no cervical lymphadenopathy Lungs- Clear to ausculation bilaterally, normal work of breathing Heart- Regular rate and rhythm, no murmurs, rubs or gallops, PMI not laterally displaced GI- soft, NT, ND, + BS Extremities- no clubbing, cyanosis, or edema MS- no significant deformity or atrophy Skin- no rash or lesion Psych- euthymic mood, full affect Neuro- strength and sensation are intact  EKG-  atrial paced at 70 bpm with prolonged AV conduction NSIVB    Assessment and Plan: 1. Atach  Appears to be a sensed  today Not aware of heart racing for some time  Feels well at this time  Continue dofetilide 125 mcg bid Continue metoprolol xl 200 mg a day  Bmet/mag today   2. CHF Continue daily weights, weight stable at home per pt  Avoid salt   3. CAD Complex disease  Minimal  use of NTG recently  4. CHA2DS2VASc score of at least 6 Continue wafarin   F/u here in 3 months     Butch Penny C. Stephinie Battisti, [facility] [address] [place], [place] [address] [phone]

## 2019-02-28 ENCOUNTER — Inpatient Hospital Stay: Payer: Medicare Other

## 2019-02-28 ENCOUNTER — Inpatient Hospital Stay: Payer: Medicare Other | Attending: Hematology and Oncology

## 2019-02-28 ENCOUNTER — Other Ambulatory Visit: Payer: Self-pay | Admitting: Gynecologic Oncology

## 2019-02-28 ENCOUNTER — Other Ambulatory Visit: Payer: Self-pay

## 2019-02-28 ENCOUNTER — Telehealth: Payer: Self-pay | Admitting: *Deleted

## 2019-02-28 DIAGNOSIS — D3910 Neoplasm of uncertain behavior of unspecified ovary: Secondary | ICD-10-CM

## 2019-02-28 DIAGNOSIS — C786 Secondary malignant neoplasm of retroperitoneum and peritoneum: Secondary | ICD-10-CM | POA: Insufficient documentation

## 2019-02-28 DIAGNOSIS — C569 Malignant neoplasm of unspecified ovary: Secondary | ICD-10-CM | POA: Insufficient documentation

## 2019-02-28 MED ORDER — TAMOXIFEN CITRATE 10 MG PO TABS: 10.0000 mg | ORAL_TABLET | Freq: Every day | ORAL | 3 refills | Status: DC

## 2019-02-28 MED ORDER — SODIUM CHLORIDE 0.9% FLUSH
10.0000 mL | Freq: Once | INTRAVENOUS | Status: AC
  Administered 2019-02-28: 10 mL
  Filled 2019-02-28: qty 10

## 2019-02-28 MED ORDER — HEPARIN SOD (PORK) LOCK FLUSH 100 UNIT/ML IV SOLN
250.0000 [IU] | Freq: Once | INTRAVENOUS | Status: AC
  Administered 2019-02-28: 500 [IU]
  Filled 2019-02-28: qty 5

## 2019-02-28 NOTE — Telephone Encounter (Signed)
Patient called and stated she needs a refill for tamoxifen.

## 2019-02-28 NOTE — Patient Instructions (Signed)

## 2019-03-03 LAB — INHIBIN B: Inhibin B: 324.9 pg/mL — ABNORMAL HIGH (ref 0.0–16.9)

## 2019-03-05 ENCOUNTER — Other Ambulatory Visit: Payer: Self-pay | Admitting: Interventional Cardiology

## 2019-03-08 ENCOUNTER — Telehealth: Payer: Self-pay | Admitting: Interventional Cardiology

## 2019-03-08 ENCOUNTER — Ambulatory Visit
Admission: RE | Admit: 2019-03-08 | Discharge: 2019-03-08 | Disposition: A | Discharge status: Home / Self Care | Payer: Medicare Other | Source: Ambulatory Visit | Attending: Interventional Cardiology | Admitting: Interventional Cardiology

## 2019-03-08 ENCOUNTER — Other Ambulatory Visit: Payer: Self-pay

## 2019-03-08 ENCOUNTER — Encounter: Payer: Self-pay | Admitting: Interventional Cardiology

## 2019-03-08 ENCOUNTER — Ambulatory Visit (INDEPENDENT_AMBULATORY_CARE_PROVIDER_SITE_OTHER): Payer: Medicare Other | Admitting: Interventional Cardiology

## 2019-03-08 VITALS — BP 142/76 | HR 66 | Ht 60.0 in | Wt 173.6 lb

## 2019-03-08 DIAGNOSIS — D6869 Other thrombophilia: Secondary | ICD-10-CM | POA: Diagnosis not present

## 2019-03-08 DIAGNOSIS — I4819 Other persistent atrial fibrillation: Secondary | ICD-10-CM | POA: Diagnosis not present

## 2019-03-08 DIAGNOSIS — I10 Essential (primary) hypertension: Secondary | ICD-10-CM

## 2019-03-08 DIAGNOSIS — R0602 Shortness of breath: Secondary | ICD-10-CM

## 2019-03-08 DIAGNOSIS — I25118 Atherosclerotic heart disease of native coronary artery with other forms of angina pectoris: Secondary | ICD-10-CM | POA: Diagnosis not present

## 2019-03-08 MED ORDER — LOSARTAN POTASSIUM 50 MG PO TABS: 50.0000 mg | ORAL_TABLET | Freq: Every day | ORAL | 3 refills | Status: DC

## 2019-03-08 NOTE — Telephone Encounter (Signed)
Pt c/o Shortness Of Breath: STAT if SOB developed within the last 24 hours or pt is noticeably SOB on the phone  1. Are you currently SOB (can you hear that pt is SOB on the phone)? No, does not sound SOB on the phone either.  2. How long have you been experiencing SOB? This SOB started last night - its a different kind of SOB.  3. Are you SOB when sitting or when up moving around? Up moving around, Walking 20-86ft she gets out of breath.   4. Are you currently experiencing any other symptoms? Soft bowels, BP 159/69  HR 69 this morning and 169/70 HR 72 last night. Denies having a fever or CP. Took nitrogylcerin last night despite not having CP and it did help. This morning when she got up about 7:30am she went to the bathroom by the time she came back out of the bathroom she was really out of breath. She then took 2 more nitroglycerin tablets. She has taken all of her morning meds as usual. Temperature 97.7. Oxygen level 93% last night and this morning 85% and has gone up since to about 93%

## 2019-03-08 NOTE — Patient Instructions (Signed)
Medication Instructions:  Your physician has recommended you make the following change in your medication:   INCREASE: losartan to 50 mg once a day  *If you need a refill on your cardiac medications before your next appointment, please call your pharmacy*  Lab Work: TODAY: CBC, BMET, BNP  If you have labs (blood work) drawn today and your tests are completely normal, you will receive your results only by: Marland Kitchen MyChart Message (if you have MyChart) OR . A paper copy in the mail If you have any lab test that is abnormal or we need to change your treatment, we will call you to review the results.  Testing/Procedures: A chest x-ray takes a picture of the organs and structures inside the chest, including the heart, lungs, and blood vessels. This test can show several things, including, whether the heart is enlarges; whether fluid is building up in the lungs; and whether pacemaker / defibrillator leads are still in place.  Chest X-ray Instructions:    1. You may have this done at the [facility], located in the        [place] on the 1st floor.    2. You do no have to have an appointment.    3. [place], [place] [address]        [phone]        Monday - Friday  8:00 am - 5:00 pm   Follow-Up: Follow up in March   Other Instructions

## 2019-03-08 NOTE — Telephone Encounter (Signed)
Having a different kind of SOB than usually feels with her heart disease - different by stronger and lasts longer.  Out of breath trying to get dressed.  Taking longer to recover when sits down. Worse this morning, In past SOB is usually accompanied by chest pressure and would take ntg.    She is pleasantly conversing on the phone currently.   afib under control with Tikosyn.  In rhythm at last visit last week.  All vitals were perfect per patient.  Now - BP is all over the place. 169/70 10pm.  Took ntg (3) to "calm things down inside"  Wore CPAP and went to bed.  This am 159/79   Has no chest pain. HR stable in 60s-72.  Drippy nose for several weeks. - took one Claritin last week - but is not sure if it was Claritin D or not.  Other than SOB which is not present at rest but seems worse than normal with activity I do not think she has any other symptoms.  She will come to office today to be seen for SOB.

## 2019-03-08 NOTE — Progress Notes (Signed)
Cardiology Office Note   Date:  03/08/2019   ID:  Patty [name], DOB 1930-09-07, MRN [id number]  PCP:  [name], MD    No chief complaint on file.  Chronic diastolic heart failure/CAD  Wt Readings from Last 3 Encounters:  03/08/19 173 lb 9.6 oz (78.7 kg)  02/24/19 175 lb 6.4 oz (79.6 kg)  12/27/18 178 lb (80.7 kg)       History of Present Illness: Patty [name] is a 86 y.o. female  with history of CAD s/p multiple stents, chronic combined CHF, permanent atrial fib/flutter on Coumadin with tachy-brady syndrome s/p leadless PPM (with premature battery failure no longer active - being followed conservatively for now), probable CKD III per labs, granulosa cell carcinoma, HTN, HLD, LBBB, varicose veins, renal artery stenosis, PVD, diverticulitis, OSA who presents for f/u.  She has significant CAD with stenting x 2 to the LAD and x 1 to the LCx in 2000, rotational arthrectomy to proximal LCx and DES to LAD in 2014 and DES to LCx in 2014, along with DES to LAD for -in-stent stenosis in 2015, and DES to ostial LAD in 2016. PVD is listed in chart but I cannot find further notes regarding this as LE doppler 2014 showed no evidence of significant LE disease   She was admittedin June 2018with exertional dyspnea and chest tightness and palpitations. She was found to be in rapid atrial fib/flutter and minimally elevated troponin. 2D Echo 07/11/16 showed moderate focal basal and mild concentric hypertrophy, EF 50-55%, no RWMA, septal dyssyngery c/w LBBB, mild AI, massively dilated LA, trivial TR, mild PR. Coumadin was held in prep for cath which was performed 07/14/16 showing 100% CTO of OM1 with L-L collaterals, 90% distal Cx, 100% distal PLOM, otherwise nonobstructive disease, normal LVEDP - unsuccessful PCI of the distal Cx due to inability to cross the lesion with a balloon. She was brought back 07/15/16 and underwent successful orbital atherectomy &DES to mid LCx.   She had an episode  of Shingles around Memorial Day 2019. It was on the right side of her chest. She was treated with acyclovir. She still has some itching. She had constipation and nausea. She treated this with peptobismol. She initially thought it was a heart episode, but then with the rash, realized it was shingles. She still has some trouble sleeping.   She had a lot of fatigue with the shingles. SHe had a lot of stress with her son who is an alcoholic.  He had intrabdominal bleeding in 10/19 from tumors. Anticoagulation was stopped for a while and restarted when approval was given by her oncology team.   She has been treated with chemo and has responded well. No bleeding, even after Coumadin was started.   On 09/13/2018, we communicated with the patient: "She states that her SOB has worsenedover the past week. She states that her SOB is worse first thing in the morning when she wakes up and then with activity. She denies having any chest pain, swelling, or any other Sx. She states that she has gradually gained 9 lbs over the last 8 weeks but no significant weight gain over the past week. Patient does admit to eating foods that are high in salt. Patient takes lasix 80 mg QD and k-dur 20 mEq QD. Patient also has spironolactone that she uses prn, however she has not used it in the last 2 weeks. Instructed the patient to take her prn spironolactone. Instructed patient to avoid foods that  are high in salt. Patient's last Cr- 1.00 and K-3.3 on 7/29. Patient is coming in for her INR check today. Per Dr. Irish Lack patient will need BMET check at her next INR check. Will arrange after her visit today."  She has taken 2 doses of spironolactone. No change in sx. She sleeps well, with CPAP. Sx get better as the day goes on. Breathing improves. She takes her Lasix dose first thing in the morning.   She does have leg weakness and is seeing a neuro MD. May be related to chemotherapy."  She was admitted with  chest pain and ruled out for MI in September 2020.  SHe was treated for heart failure with IV Lasix.    SHOB started last night.  She has had a chronic runny nose over the past few weeks. She does not feel angina.  She does not feel that she is volume overloaded.  Her BP was high for the past few days.  She has not taken her Lasix yet today.  Past Medical History:  Diagnosis Date  . Bleeding behind the abdominal cavity 10/2017  . Chronic anticoagulation - coumadin, CHADS2VASC=6 05/17/2015  . CKD (chronic kidney disease), stage III   . Combined systolic and diastolic heart failure ([place])   . Coronary artery disease    a. s/p multiple stents - stenting x 2 to the LAD and x 1 to the LCx in 2000, rotational arthrectomy to proximal LCx and DES to LAD in 2014 and DES to LCx in 2014, along with DES to LAD for re-in-stent stenosis in 2015, and DES to ostial LAD in 2016. b. 07/2016 - orbital atherectomy & DES to mid LCx.  . Diverticulitis   . Granulosa cell carcinoma ([place])    abd; last episode was in 2009  . Hyperlipidemia   . Hypertension   . LBBB (left bundle branch block)   . Myocardial infarction ([place]) 2002  . Obesity   . OSA on CPAP   . Ovarian ca ([place]) 2019  . Pacemaker failure    a. Prior leadless PPM with premature battery failure, being managed conservatively without replacement.  . Peripheral vascular disease ([place])   . Permanent atrial fibrillation ([place]) 2013  . Renal artery stenosis ([place])   . Tachycardia-bradycardia syndrome ([place])    a. s/p leadless pacemaker ([name]) implanted by Dr [name]  . Varicose veins     Past Surgical History:  Procedure Laterality Date  . ABDOMINAL HYSTERECTOMY    . CARDIAC CATHETERIZATION  09/03/2007   EF 70%; Failed attempt at PCI to OM  . CARDIAC CATHETERIZATION  11/01/2003   EF 70%  . CARDIAC CATHETERIZATION N/A 12/14/2015   Procedure: Left Heart Cath and Coronary Angiography;  Surgeon: Burnell Blanks, MD;  Location: [place] CV LAB;   Service: Cardiovascular;  Laterality: N/A;  . CARDIOVERSION  12/31/2011   Procedure: CARDIOVERSION;  Surgeon: [name], MD;  Location: [facility] ENDOSCOPY;  Service: Cardiovascular;  Laterality: N/A;  . CARDIOVERSION N/A 12/31/2011   Procedure: CARDIOVERSION;  Surgeon: [name], MD;  Location: [facility] CATH LAB;  Service: Cardiovascular;  Laterality: N/A;  . CARDIOVERSION N/A 11/15/2018   Procedure: CARDIOVERSION;  Surgeon: Dorothy Spark, MD;  Location: [place];  Service: Cardiovascular;  Laterality: N/A;  . CATARACT EXTRACTION, BILATERAL  2015  . CHOLECYSTECTOMY  1980's  . COLON SURGERY  2004   colectomy for diverticulosis  . CORONARY ANGIOPLASTY WITH STENT PLACEMENT  2000    and 08/11/2012; 11/12/2012: 3 +  2 LAD & CFX; 2nd CFX stent 11/12/2012  . CORONARY ATHERECTOMY N/A 07/15/2016   Procedure: Coronary Atherectomy;  Surgeon: Martinique, Peter M, MD;  Location: [place] [facility] LAB;  Service: Cardiovascular;  Laterality: N/A;  . CORONARY BALLOON ANGIOPLASTY N/A 07/14/2016   Procedure: Coronary Balloon Angioplasty;  Surgeon: Martinique, Peter M, MD;  Location: [place] [facility] LAB;  Service: Cardiovascular;  Laterality: N/A;  . CORONARY STENT INTERVENTION N/A 07/15/2016   Procedure: Coronary Stent Intervention;  Surgeon: Martinique, Peter M, MD;  Location: [place] CV LAB;  Service: Cardiovascular;  Laterality: N/A;  . FRACTIONAL FLOW RESERVE WIRE  10/07/2013   Procedure: [place];  Surgeon: [name], MD;  Location: [facility] CATH LAB;  Service: Cardiovascular;;  . HERNIA REPAIR  2005   "laparoscopic"  . IR IMAGING GUIDED PORT INSERTION  10/28/2017  . LEFT HEART CATH AND CORONARY ANGIOGRAPHY N/A 07/14/2016   Procedure: Left Heart Cath and Coronary Angiography;  Surgeon: Martinique, Peter M, MD;  Location: [place] CV LAB;  Service: Cardiovascular;  Laterality: N/A;  . LEFT HEART CATH AND CORONARY ANGIOGRAPHY N/A 11/10/2018   Procedure: LEFT HEART CATH AND CORONARY  ANGIOGRAPHY;  Surgeon: Wellington Hampshire, MD;  Location: [place] CV LAB;  Service: Cardiovascular;  Laterality: N/A;  . LEFT HEART CATHETERIZATION WITH CORONARY ANGIOGRAM N/A 11/12/2012   Procedure: LEFT HEART CATHETERIZATION WITH CORONARY ANGIOGRAM;  Surgeon: [name], MD;  Location: [facility] CATH LAB;  Service: Cardiovascular;  Laterality: N/A;  . LEFT HEART CATHETERIZATION WITH CORONARY ANGIOGRAM N/A 10/07/2013   Procedure: LEFT HEART CATHETERIZATION WITH CORONARY ANGIOGRAM;  Surgeon: [name], MD;  Location: [facility] C[facility] LAB;  Service: Cardiovascular;  Laterality: N/A;  . LEFT HEART CATHETERIZATION WITH CORONARY ANGIOGRAM N/A 12/14/2013   Procedure: LEFT HEART CATHETERIZATION WITH CORONARY ANGIOGRAM;  Surgeon: Sinclair Grooms, MD;  Location: [facility] CATH LAB;  Service: Cardiovascular;  Laterality: N/A;  . LEFT HEART CATHETERIZATION WITH CORONARY ANGIOGRAM N/A 05/16/2014   Procedure: LEFT HEART CATHETERIZATION WITH CORONARY ANGIOGRAM;  Surgeon: Sherren Mocha, MD;  Location: [facility] CATH LAB;  Service: Cardiovascular;  Laterality: N/A;  . PERCUTANEOUS CORONARY INTERVENTION-BALLOON ONLY  08/04/2012   Procedure: PERCUTANEOUS CORONARY INTERVENTION-BALLOON ONLY;  Surgeon: [name], MD;  Location: [facility] CATH LAB;  Service: Cardiovascular;;  . PERCUTANEOUS CORONARY ROTOBLATOR INTERVENTION (PCI-R) N/A 08/11/2012   Procedure: PERCUTANEOUS CORONARY ROTOBLATOR INTERVENTION (PCI-R);  Surgeon: [name], MD;  Location: [facility] CATH LAB;  Service: Cardiovascular;  Laterality: N/A;  . PERCUTANEOUS CORONARY STENT INTERVENTION (PCI-S)  10/07/2013   Procedure: PERCUTANEOUS CORONARY STENT INTERVENTION (PCI-S);  Surgeon: [name], MD;  Location: [facility] CATH LAB;  Service: Cardiovascular;;  . PERMANENT PACEMAKER INSERTION N/A 03/16/2012   [name] (SJM) leadless pacemaker (LEADLESS II STUDY PATEINT)  . SALIVARY GLAND SURGERY  2000's   "had a little lump removed; granulosa related; it was benign"  (08/11/2012)  . TEE WITHOUT CARDIOVERSION  12/31/2011   Procedure: TRANSESOPHAGEAL ECHOCARDIOGRAM (TEE);  Surgeon: [name], MD;  Location: [facility] ENDOSCOPY;  Service: Cardiovascular;  Laterality: N/A;  . UMBILICAL GRANULOMA EXCISION  2000   2003; 2004; 2007: "all in my abdomen including small intestines, outside my ?uterus/etc" (08/11/2012)  . VARICOSE VEIN SURGERY Bilateral 1977     Current Outpatient Medications  Medication Sig Dispense Refill  . acetaminophen (TYLENOL) 500 MG tablet Take 500 mg by mouth every 6 (six) hours as needed for mild pain or headache.     Marland Kitchen atorvastatin (LIPITOR) 80 MG  tablet TAKE 1 TABLET BY MOUTH EVERY DAY IN THE MORNING 90 tablet 3  . beta carotene w/minerals (OCUVITE) tablet Take 1 tablet by mouth 2 (two) times daily.     . Calcium Carb-Cholecalciferol (CALTRATE 600+D) 600-800 MG-UNIT TABS Take 1 tablet by mouth every evening.     . clopidogrel (PLAVIX) 75 MG tablet Take 1 tablet (75 mg total) by mouth daily. 90 tablet 3  . Coenzyme Q-10 100 MG capsule Take 200 mg by mouth at bedtime.     . docusate sodium (COLACE) 100 MG capsule Take 100 mg by mouth daily as needed for mild constipation (IF NO B/M AFTER 2 DAYS).     Marland Kitchen dofetilide (TIKOSYN) 125 MCG capsule Take 1 capsule (125 mcg total) by mouth 2 (two) times daily. 60 capsule 6  . furosemide (LASIX) 40 MG tablet Take 2 tablets by mouth every morning and 2 additional tabs as needed for wt gain/abdominal distension    . gabapentin (NEURONTIN) 300 MG capsule TAKE 1 CAPSULE BY MOUTH TWICE A DAY 60 capsule 8  . isosorbide mononitrate (IMDUR) 60 MG 24 hr tablet TAKE 1 TABLET BY MOUTH EVERY DAY 30 tablet 11  . lidocaine-prilocaine (EMLA) cream Apply to PAC site 1-2 hours prior to access 30 g 1  . losartan (COZAAR) 25 MG tablet Take 1 tablet (25 mg total) by mouth daily. 30 tablet 3  . metoprolol (TOPROL-XL) 200 MG 24 hr tablet Take 1 tablet (200 mg total) by mouth daily. Take with or immediately following a meal.  30 tablet 6  . Multiple Vitamin (MULTIVITAMIN) capsule Take 1 capsule by mouth daily.    . nitroGLYCERIN (NITROSTAT) 0.4 MG SL tablet PLACE 1 TABLET (0.4 MG TOTAL) UNDER THE TONGUE EVERY 5 (FIVE) MINUTES AS NEEDED FOR CHEST PAIN 75 tablet 2  . Omega-3 Fatty Acids (FISH OIL) 1200 MG CAPS Take 1,200 mg by mouth daily.     . potassium chloride SA (KLOR-CON) 20 MEQ tablet Take 1 tablet (20 mEq total) by mouth 2 (two) times daily. 180 tablet 3  . tamoxifen (NOLVADEX) 10 MG tablet Take 1 tablet (10 mg total) by mouth daily. 30 tablet 3  . warfarin (COUMADIN) 2 MG tablet TAKE AS DIRECTED BY COUMADIN CLINIC 30 tablet 3   No current facility-administered medications for this visit.    Allergies:   Bee venom, Other, Oxycodone hcl, Amiodarone, and Prednisone    Social History:  The patient  reports that she quit smoking about 45 years ago. Her smoking use included cigarettes. She has a 32.00 pack-year smoking history. She has never used smokeless tobacco. She reports current alcohol use of about 7.0 standard drinks of alcohol per week. She reports that she does not use drugs.   Family History:  The patient's family history includes Diabetes in her brother and father; Heart attack in her brother and father; Hypertension in her brother and father; Kidney failure in her brother; Stroke in her mother.    ROS:  Please see the history of present illness.   Otherwise, review of systems are positive for shortness of breath.   All other systems are reviewed and negative.    PHYSICAL EXAM: VS:  BP (!) 142/76   Pulse 66   Ht 5' (1.524 m)   Wt 173 lb 9.6 oz (78.7 kg)   SpO2 92%   BMI 33.90 kg/m  , BMI Body mass index is 33.9 kg/m. GEN: Well nourished, well developed, in no acute distress  HEENT: normal  Neck: no JVD, carotid bruits, or masses Cardiac: RRR; 2/6 systolic murmur, no rubs, or gallops,no edema  Respiratory:  Crackles at the right base, normal work of breathing GI: soft, nontender,  nondistended, + BS MS: no deformity or atrophy  Skin: warm and dry, no rash Neuro:  Strength and sensation are intact Psych: euthymic mood, full affect   EKG:   The ekg ordered Jan 2021 demonstrates atrial pacing    Recent Labs: 10/08/2018: TSH 2.089 10/09/2018: ALT 30 11/09/2018: B Natriuretic Peptide 664.9 11/14/2018: Hemoglobin 13.3; Platelets 212 12/06/2018: Magnesium 2.0 12/09/2018: BUN 23; Creatinine, Ser 1.07; Potassium 4.0; Sodium 143   Lipid Panel    Component Value Date/Time   CHOL 112 11/10/2018 0536   CHOL 132 10/08/2017 0905   TRIG 69 11/10/2018 0536   HDL 36 (L) 11/10/2018 0536   HDL 66 10/08/2017 0905   CHOLHDL 3.1 11/10/2018 0536   VLDL 14 11/10/2018 0536   LDLCALC 62 11/10/2018 0536   LDLCALC 52 10/08/2017 0905     Other studies Reviewed: Additional studies/ records that were reviewed today with results demonstrating: labs from 11/2018 reviewed- Cr, K stable.   ASSESSMENT AND PLAN:  1. SHOB:  Started acutely yesterday and worse last night.  Get CXR. She has felt anxious living alone.  She has a daughter in [place] and a granddaughter in [place]. If CXR abnormal, will have to consider sending her to ER.  She is agreeable to this.,  2. AFib: Appears to be in NSR.   3. CAD: No angina at this time.  Continue aggressive secondary prevention.   4. HTN: BP has been running high over the past few days. Increase losartan to 50 mg daily.  5. Check CBC, BMet and BNP.    Current medicines are reviewed at length with the patient today.  The patient concerns regarding her medicines were addressed.  The following changes have been made:  No change  Labs/ tests ordered today include:  No orders of the defined types were placed in this encounter.   Recommend 150 minutes/week of aerobic exercise Low fat, low carb, high fiber diet recommended  Disposition:   FU in 1 month as scheduled   Signed, Larae Grooms, MD  03/08/2019 3:[address] [facility] [address], [place], [place]  [address] Phone: [phone]; Fax: [phone]

## 2019-03-09 ENCOUNTER — Ambulatory Visit: Payer: Medicare Other | Admitting: Podiatry

## 2019-03-09 LAB — BASIC METABOLIC PANEL
BUN/Creatinine Ratio: 20 (ref 12–28)
BUN: 19 mg/dL (ref 8–27)
CO2: 25 mmol/L (ref 20–29)
Calcium: 8.7 mg/dL (ref 8.7–10.3)
Chloride: 105 mmol/L (ref 96–106)
Creatinine, Ser: 0.95 mg/dL (ref 0.57–1.00)
GFR calc Af Amer: 63 mL/min/{1.73_m2} (ref >59)
GFR calc non Af Amer: 54 mL/min/{1.73_m2} — ABNORMAL LOW (ref >59)
Glucose: 107 mg/dL — ABNORMAL HIGH (ref 65–99)
Potassium: 4.2 mmol/L (ref 3.5–5.2)
Sodium: 144 mmol/L (ref 134–144)

## 2019-03-09 LAB — CBC
Hematocrit: 35.1 % (ref 34.0–46.6)
Hemoglobin: 11.9 g/dL (ref 11.1–15.9)
MCH: 33.1 pg — ABNORMAL HIGH (ref 26.6–33.0)
MCHC: 33.9 g/dL (ref 31.5–35.7)
MCV: 98 fL — ABNORMAL HIGH (ref 79–97)
Platelets: 180 10*3/uL (ref 150–450)
RBC: 3.6 x10E6/uL — ABNORMAL LOW (ref 3.77–5.28)
RDW: 13 % (ref 11.7–15.4)
WBC: 5.4 10*3/uL (ref 3.4–10.8)

## 2019-03-09 LAB — PRO B NATRIURETIC PEPTIDE: NT-Pro BNP: 7514 pg/mL — ABNORMAL HIGH (ref 0–738)

## 2019-03-09 NOTE — Progress Notes (Signed)
WBC not elevated.  Will d/w Dr. Delfina Redwood as to whether antibiotics are indicated.  She cannot take Levaquin with Tikosyn.

## 2019-03-15 ENCOUNTER — Other Ambulatory Visit: Payer: Self-pay | Admitting: Interventional Cardiology

## 2019-03-15 ENCOUNTER — Telehealth: Payer: Self-pay | Admitting: Interventional Cardiology

## 2019-03-15 DIAGNOSIS — R0602 Shortness of breath: Secondary | ICD-10-CM

## 2019-03-15 NOTE — Telephone Encounter (Signed)
Pt c/o medication issue:  1. Name of Medication: furosemide (LASIX) 40 MG tablet  2. How are you currently taking this medication (dosage and times per day)? 2 tablets by mouth every morning   3. Are you having a reaction (difficulty breathing--STAT)? No   4. What is your medication issue? Patient is calling stating Dr. Irish Lack advised the patient to take the medication Lasix 2 tablets by mouth in the morning and 2 tablets by mouth at night for 3 days and callback with the status of how she is feeling. The patient has actually taken a four day dosage and states she feels better since taking it. She states she is still a little weak and her lungs aren't completely clear, but she definitely notices a great difference and would like to know how Dr. Irish Lack would like to proceed from here.

## 2019-03-15 NOTE — Telephone Encounter (Signed)
Called and spoke to patient and made her aware that since she is feeling better that we can have her take the furosemide 40 mg, 2 tablets QD with the flexibility to take 1-2 tablets PRN in the PM for increased swelling and SOB. Patient will keep appt for BMET and BNP on 2/16. Patient verbalized understanding and thanked me for the call.

## 2019-03-16 ENCOUNTER — Ambulatory Visit (INDEPENDENT_AMBULATORY_CARE_PROVIDER_SITE_OTHER): Payer: Medicare Other | Admitting: Podiatry

## 2019-03-16 ENCOUNTER — Encounter: Payer: Self-pay | Admitting: Podiatry

## 2019-03-16 ENCOUNTER — Other Ambulatory Visit: Payer: Self-pay

## 2019-03-16 DIAGNOSIS — L84 Corns and callosities: Secondary | ICD-10-CM | POA: Diagnosis not present

## 2019-03-16 DIAGNOSIS — B351 Tinea unguium: Secondary | ICD-10-CM

## 2019-03-16 DIAGNOSIS — G620 Drug-induced polyneuropathy: Secondary | ICD-10-CM | POA: Diagnosis not present

## 2019-03-16 DIAGNOSIS — M79676 Pain in unspecified toe(s): Secondary | ICD-10-CM

## 2019-03-16 DIAGNOSIS — T451X5A Adverse effect of antineoplastic and immunosuppressive drugs, initial encounter: Secondary | ICD-10-CM | POA: Diagnosis not present

## 2019-03-16 NOTE — Patient Instructions (Signed)
Corns and Calluses Corns are small areas of thickened skin that occur on the top, sides, or tip of a toe. They contain a cone-shaped core with a point that can press on a nerve below. This causes pain.  Calluses are areas of thickened skin that can occur anywhere on the body, including the hands, fingers, palms, soles of the feet, and heels. Calluses are usually larger than corns. What are the causes? Corns and calluses are caused by rubbing (friction) or pressure, such as from shoes that are too tight or do not fit properly. What increases the risk? Corns are more likely to develop in people who have misshapen toes (toe deformities), such as hammer toes. Calluses can occur with friction to any area of the skin. They are more likely to develop in people who:  Work with their hands.  Wear shoes that fit poorly, are too tight, or are high-heeled.  Have toe deformities. What are the signs or symptoms? Symptoms of a corn or callus include:  A hard growth on the skin.  Pain or tenderness under the skin.  Redness and swelling.  Increased discomfort while wearing tight-fitting shoes, if your feet are affected. If a corn or callus becomes infected, symptoms may include:  Redness and swelling that gets worse.  Pain.  Fluid, blood, or pus draining from the corn or callus. How is this diagnosed? Corns and calluses may be diagnosed based on your symptoms, your medical history, and a physical exam. How is this treated? Treatment for corns and calluses may include:  Removing the cause of the friction or pressure. This may involve: ? Changing your shoes. ? Wearing shoe inserts (orthotics) or other protective layers in your shoes, such as a corn pad. ? Wearing gloves.  Applying medicine to the skin (topical medicine) to help soften skin in the hardened, thickened areas.  Removing layers of dead skin with a file to reduce the size of the corn or callus.  Removing the corn or callus with a  scalpel or laser.  Taking antibiotic medicines, if your corn or callus is infected.  Having surgery, if a toe deformity is the cause. Follow these instructions at home:   Take over-the-counter and prescription medicines only as told by your health care provider.  If you were prescribed an antibiotic, take it as told by your health care provider. Do not stop taking it even if your condition starts to improve.  Wear shoes that fit well. Avoid wearing high-heeled shoes and shoes that are too tight or too loose.  Wear any padding, protective layers, gloves, or orthotics as told by your health care provider.  Soak your hands or feet and then use a file or pumice stone to soften your corn or callus. Do this as told by your health care provider.  Check your corn or callus every day for symptoms of infection. Contact a health care provider if you:  Notice that your symptoms do not improve with treatment.  Have redness or swelling that gets worse.  Notice that your corn or callus becomes painful.  Have fluid, blood, or pus coming from your corn or callus.  Have new symptoms. Summary  Corns are small areas of thickened skin that occur on the top, sides, or tip of a toe.  Calluses are areas of thickened skin that can occur anywhere on the body, including the hands, fingers, palms, and soles of the feet. Calluses are usually larger than corns.  Corns and calluses are caused by   rubbing (friction) or pressure, such as from shoes that are too tight or do not fit properly.  Treatment may include wearing any padding, protective layers, gloves, or orthotics as told by your health care provider. This information is not intended to replace advice given to you by your health care provider. Make sure you discuss any questions you have with your health care provider. Document Revised: 05/12/2018 Document Reviewed: 12/03/2016 Else[employer] Patient Education  2020 [employer]  Peripheral  Neuropathy Peripheral neuropathy is a type of nerve damage. It affects nerves that carry signals between the spinal cord and the arms, legs, and the rest of the body (peripheral nerves). It does not affect nerves in the spinal cord or brain. In peripheral neuropathy, one nerve or a group of nerves may be damaged. Peripheral neuropathy is a broad category that includes many specific nerve disorders, like diabetic neuropathy, hereditary neuropathy, and carpal tunnel syndrome. What are the causes? This condition may be caused by:  Diabetes. This is the most common cause of peripheral neuropathy.  Nerve injury.  Pressure or stress on a nerve that lasts a long time.  Lack (deficiency) of B vitamins. This can result from alcoholism, poor diet, or a restricted diet.  Infections.  Autoimmune diseases, such as rheumatoid arthritis and systemic lupus erythematosus.  Nerve diseases that are passed from parent to child (inherited).  Some medicines, such as cancer medicines (chemotherapy).  Poisonous (toxic) substances, such as lead and mercury.  Too little blood flowing to the legs.  Kidney disease.  Thyroid disease. In some cases, the cause of this condition is not known. What are the signs or symptoms? Symptoms of this condition depend on which of your nerves is damaged. Common symptoms include:  Loss of feeling (numbness) in the feet, hands, or both.  Tingling in the feet, hands, or both.  Burning pain.  Very sensitive skin.  Weakness.  Not being able to move a part of the body (paralysis).  Muscle twitching.  Clumsiness or poor coordination.  Loss of balance.  Not being able to control your bladder.  Feeling dizzy.  Sexual problems. How is this diagnosed? Diagnosing and finding the cause of peripheral neuropathy can be difficult. Your health care provider will take your medical history and do a physical exam. A neurological exam will also be done. This involves  checking things that are affected by your brain, spinal cord, and nerves (nervous system). For example, your health care provider will check your reflexes, how you move, and what you can feel. You may have other tests, such as:  Blood tests.  Electromyogram (EMG) and nerve conduction tests. These tests check nerve function and how well the nerves are controlling the muscles.  Imaging tests, such as CT scans or MRI to rule out other causes of your symptoms.  Removing a small piece of nerve to be examined in a lab (nerve biopsy). This is rare.  Removing and examining a small amount of the fluid that surrounds the brain and spinal cord (lumbar puncture). This is rare. How is this treated? Treatment for this condition may involve:  Treating the underlying cause of the neuropathy, such as diabetes, kidney disease, or vitamin deficiencies.  Stopping medicines that can cause neuropathy, such as chemotherapy.  Medicine to relieve pain. Medicines may include: ? Prescription or over-the-counter pain medicine. ? Antiseizure medicine. ? Antidepressants. ? Pain-relieving patches that are applied to painful areas of skin.  Surgery to relieve pressure on a nerve or to destroy a   nerve that is causing pain.  Physical therapy to help improve movement and balance.  Devices to help you move around (assistive devices). Follow these instructions at home: Medicines  Take over-the-counter and prescription medicines only as told by your health care provider. Do not take any other medicines without first asking your health care provider.  Do not drive or use heavy machinery while taking prescription pain medicine. Lifestyle   Do not use any products that contain nicotine or tobacco, such as cigarettes and e-cigarettes. Smoking keeps blood from reaching damaged nerves. If you need help quitting, ask your health care provider.  Avoid or limit alcohol. Too much alcohol can cause a vitamin B deficiency,  and vitamin B is needed for healthy nerves.  Eat a healthy diet. This includes: ? Eating foods that are high in fiber, such as fresh fruits and vegetables, whole grains, and beans. ? Limiting foods that are high in fat and processed sugars, such as fried or sweet foods. General instructions   If you have diabetes, work closely with your health care provider to keep your blood sugar under control.  If you have numbness in your feet: ? Check every day for signs of injury or infection. Watch for redness, warmth, and swelling. ? Wear padded socks and comfortable shoes. These help protect your feet.  Develop a good support system. Living with peripheral neuropathy can be stressful. Consider talking with a mental health specialist or joining a support group.  Use assistive devices and attend physical therapy as told by your health care provider. This may include using a walker or a cane.  Keep all follow-up visits as told by your health care provider. This is important. Contact a health care provider if:  You have new signs or symptoms of peripheral neuropathy.  You are struggling emotionally from dealing with peripheral neuropathy.  Your pain is not well-controlled. Get help right away if:  You have an injury or infection that is not healing normally.  You develop new weakness in an arm or leg.  You fall frequently. Summary  Peripheral neuropathy is when the nerves in the arms, or legs are damaged, resulting in numbness, weakness, or pain.  There are many causes of peripheral neuropathy, including diabetes, pinched nerves, vitamin deficiencies, autoimmune disease, and hereditary conditions.  Diagnosing and finding the cause of peripheral neuropathy can be difficult. Your health care provider will take your medical history, do a physical exam, and do tests, including blood tests and nerve function tests.  Treatment involves treating the underlying cause of the neuropathy and taking  medicines to help control pain. Physical therapy and assistive devices may also help. This information is not intended to replace advice given to you by your health care provider. Make sure you discuss any questions you have with your health care provider. Document Revised: 01/02/2017 Document Reviewed: 03/31/2016 Elsevier Patient Education  2020 [employer]  

## 2019-03-18 ENCOUNTER — Other Ambulatory Visit: Payer: Medicare Other

## 2019-03-18 NOTE — Progress Notes (Signed)
Subjective: Patty [name] presents today for follow up of callus(es) b/l feet and painful mycotic toenails b/l that are difficult to trim. Pain interferes with ambulation. Aggravating factors include wearing enclosed shoe gear. Pain is relieved with periodic professional debridement..   Allergies  Allergen Reactions  . Bee Venom Anaphylaxis  . Other Nausea And Vomiting and Other (See Comments)    Pain medications cause severe vomiting. Tolerated slow IV morphine drip  . Oxycodone Hcl Nausea And Vomiting  . Amiodarone Nausea Only  . Prednisone Palpitations and Other (See Comments)    "Rapid Heart Beat"     Objective: There were no vitals filed for this visit.  Vascular Examination:  Capillary refill time to digits immediate b/l, palpable DP pulses b/l, palpable PT pulses b/l, pedal hair absent b/l and skin temperature gradient within normal limits b/l  Dermatological Examination: Pedal skin with normal turgor, texture and tone bilaterally, no open wounds bilaterally, no interdigital macerations bilaterally, toenails 1-5 b/l elongated, dystrophic, thickened, crumbly with subungual debris and hyperkeratotic lesion(s) b/l hallux and submet head 1 left foot.  No erythema, no edema, no drainage, no flocculence  Musculoskeletal: Normal muscle strength 5/5 to all lower extremity muscle groups bilaterally, no pain crepitus or joint limitation noted with ROM b/l and bunion deformity noted b/l  Neurological: Protective sensation intact 5/5 intact bilaterally with 10g monofilament b/l and vibratory sensation intact b/l  Assessment: No diagnosis found.  Plan: -Toenails 1-5 b/l were debrided in length and girth without iatrogenic bleeding. -calluses were debrided without complication or incident. Total number debrided =2 b/l hallux and submet head 1 left foot -Patient to continue soft, supportive shoe gear daily. -Patient to report any pedal injuries to medical professional  immediately. -Patient/POA to call should there be question/concern in the interim.  Return in about 9 weeks (around 05/18/2019) for nail and callus trim/ Plavix.

## 2019-03-22 ENCOUNTER — Other Ambulatory Visit: Payer: Medicare Other

## 2019-03-22 ENCOUNTER — Other Ambulatory Visit: Payer: Self-pay

## 2019-03-22 ENCOUNTER — Ambulatory Visit (INDEPENDENT_AMBULATORY_CARE_PROVIDER_SITE_OTHER): Payer: Medicare Other | Admitting: *Deleted

## 2019-03-22 DIAGNOSIS — S81801A Unspecified open wound, right lower leg, initial encounter: Secondary | ICD-10-CM | POA: Diagnosis not present

## 2019-03-22 DIAGNOSIS — R0602 Shortness of breath: Secondary | ICD-10-CM | POA: Diagnosis not present

## 2019-03-22 DIAGNOSIS — I4891 Unspecified atrial fibrillation: Secondary | ICD-10-CM | POA: Diagnosis not present

## 2019-03-22 DIAGNOSIS — Z5181 Encounter for therapeutic drug level monitoring: Secondary | ICD-10-CM | POA: Diagnosis not present

## 2019-03-22 LAB — POCT INR: INR: 2.6 (ref 2.0–3.0)

## 2019-03-22 NOTE — Patient Instructions (Signed)
Description   Conntinue taking 1 tablet daily except 1/2 tablet on Mondays, Wednesdays and Fridays. Recheck INR in 6 weeks.  Main [phone]. [facility] # [phone]

## 2019-03-23 LAB — BASIC METABOLIC PANEL
BUN/Creatinine Ratio: 19 (ref 12–28)
BUN: 20 mg/dL (ref 8–27)
CO2: 27 mmol/L (ref 20–29)
Calcium: 8.9 mg/dL (ref 8.7–10.3)
Chloride: 102 mmol/L (ref 96–106)
Creatinine, Ser: 1.08 mg/dL — ABNORMAL HIGH (ref 0.57–1.00)
GFR calc Af Amer: 54 mL/min/{1.73_m2} — ABNORMAL LOW (ref >59)
GFR calc non Af Amer: 47 mL/min/{1.73_m2} — ABNORMAL LOW (ref >59)
Glucose: 95 mg/dL (ref 65–99)
Potassium: 4.1 mmol/L (ref 3.5–5.2)
Sodium: 144 mmol/L (ref 134–144)

## 2019-03-23 LAB — PRO B NATRIURETIC PEPTIDE: NT-Pro BNP: 3420 pg/mL — ABNORMAL HIGH (ref 0–738)

## 2019-03-25 ENCOUNTER — Other Ambulatory Visit: Payer: Medicare Other

## 2019-03-25 ENCOUNTER — Encounter: Payer: Self-pay | Admitting: Gynecologic Oncology

## 2019-03-25 ENCOUNTER — Inpatient Hospital Stay: Payer: Medicare Other | Attending: Hematology and Oncology | Admitting: Gynecologic Oncology

## 2019-03-25 ENCOUNTER — Other Ambulatory Visit: Payer: Self-pay

## 2019-03-25 VITALS — BP 137/41 | HR 66 | Temp 98.7°F | Resp 18 | Wt 174.2 lb

## 2019-03-25 DIAGNOSIS — I130 Hypertensive heart and chronic kidney disease with heart failure and stage 1 through stage 4 chronic kidney disease, or unspecified chronic kidney disease: Secondary | ICD-10-CM | POA: Diagnosis not present

## 2019-03-25 DIAGNOSIS — Z9221 Personal history of antineoplastic chemotherapy: Secondary | ICD-10-CM | POA: Insufficient documentation

## 2019-03-25 DIAGNOSIS — R1909 Other intra-abdominal and pelvic swelling, mass and lump: Secondary | ICD-10-CM

## 2019-03-25 DIAGNOSIS — Z7901 Long term (current) use of anticoagulants: Secondary | ICD-10-CM | POA: Diagnosis not present

## 2019-03-25 DIAGNOSIS — N183 Chronic kidney disease, stage 3 unspecified: Secondary | ICD-10-CM | POA: Insufficient documentation

## 2019-03-25 DIAGNOSIS — C569 Malignant neoplasm of unspecified ovary: Secondary | ICD-10-CM | POA: Diagnosis not present

## 2019-03-25 DIAGNOSIS — Z90710 Acquired absence of both cervix and uterus: Secondary | ICD-10-CM | POA: Insufficient documentation

## 2019-03-25 DIAGNOSIS — I5042 Chronic combined systolic (congestive) and diastolic (congestive) heart failure: Secondary | ICD-10-CM | POA: Diagnosis not present

## 2019-03-25 DIAGNOSIS — Z90722 Acquired absence of ovaries, bilateral: Secondary | ICD-10-CM

## 2019-03-25 DIAGNOSIS — Z79810 Long term (current) use of selective estrogen receptor modulators (SERMs): Secondary | ICD-10-CM | POA: Insufficient documentation

## 2019-03-25 DIAGNOSIS — C786 Secondary malignant neoplasm of retroperitoneum and peritoneum: Secondary | ICD-10-CM | POA: Diagnosis not present

## 2019-03-25 DIAGNOSIS — R971 Elevated cancer antigen 125 [CA 125]: Secondary | ICD-10-CM | POA: Diagnosis not present

## 2019-03-25 DIAGNOSIS — E785 Hyperlipidemia, unspecified: Secondary | ICD-10-CM | POA: Insufficient documentation

## 2019-03-25 DIAGNOSIS — I4821 Permanent atrial fibrillation: Secondary | ICD-10-CM | POA: Insufficient documentation

## 2019-03-25 DIAGNOSIS — I2510 Atherosclerotic heart disease of native coronary artery without angina pectoris: Secondary | ICD-10-CM | POA: Insufficient documentation

## 2019-03-25 DIAGNOSIS — Z955 Presence of coronary angioplasty implant and graft: Secondary | ICD-10-CM | POA: Diagnosis not present

## 2019-03-25 DIAGNOSIS — K439 Ventral hernia without obstruction or gangrene: Secondary | ICD-10-CM | POA: Diagnosis not present

## 2019-03-25 DIAGNOSIS — Z950 Presence of cardiac pacemaker: Secondary | ICD-10-CM | POA: Diagnosis not present

## 2019-03-25 DIAGNOSIS — G4733 Obstructive sleep apnea (adult) (pediatric): Secondary | ICD-10-CM | POA: Insufficient documentation

## 2019-03-25 DIAGNOSIS — I252 Old myocardial infarction: Secondary | ICD-10-CM | POA: Insufficient documentation

## 2019-03-25 DIAGNOSIS — Z87891 Personal history of nicotine dependence: Secondary | ICD-10-CM | POA: Diagnosis not present

## 2019-03-25 DIAGNOSIS — Z7902 Long term (current) use of antithrombotics/antiplatelets: Secondary | ICD-10-CM | POA: Insufficient documentation

## 2019-03-25 DIAGNOSIS — I739 Peripheral vascular disease, unspecified: Secondary | ICD-10-CM | POA: Diagnosis not present

## 2019-03-25 DIAGNOSIS — R001 Bradycardia, unspecified: Secondary | ICD-10-CM | POA: Insufficient documentation

## 2019-03-25 DIAGNOSIS — Z79899 Other long term (current) drug therapy: Secondary | ICD-10-CM | POA: Diagnosis not present

## 2019-03-25 DIAGNOSIS — D3910 Neoplasm of uncertain behavior of unspecified ovary: Secondary | ICD-10-CM

## 2019-03-25 NOTE — Patient Instructions (Signed)
Dr Denman George has observed an increase in the tumor marker called Inhibin. This suggests that the cancer may be growing on the Tamoxifen. She is recommending that we look at another CT scan to see if there is measurable growth.  She will call you with the result and discuss options for changing (or continuing) current treatment.

## 2019-03-25 NOTE — Progress Notes (Signed)
Gynecologic Oncology Follow-up Note: Gyn-Onc  Consult was requested by Dr. Alvy Bimler for the evaluation of Patty [name] 86 y.o. female  CC:  Chief Complaint  Patient presents with  . granulosa cell cancer    Assessment/Plan:  [name]  is a 86 y.o.  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  Elevation in tumor marker suggesting progression. Though asymptomatic and tolerating Tamoxifen well.  Recommend CT abd/pelvis.  If significant progression on imaging - recommend considering changing to alternating tamoxifen and megace vs return to chemotherapy.   HPI: Patty [name] is an 86 year old woman who was originally seen in consultation at the request of Dr Alvy Bimler for granulosa cell ovarian cancer.   The patient's history began in approximately 2000 when she underwent ex lap, TAH, BSO with Dr Patty [name] at [facility]. She did not receive adjuvant chemotherapy. Postoperatively she had recurrences in the peritoneum in 2004, 2007 and 2011.  The 2004 surgery was with Dr Excell Seltzer and included resection of the recurrent tumor with small bowel resection and colectomy for diverticulosis. She has had a laparoscopic hernia repair with mesh in 2005. In 2007 she underwent resection of pelvic tumor recurrence with Dr Excell Seltzer and ventral hernia repair with mesh.  In 2011 Dr Excell Seltzer performed a laparoscopic resection of suprapubic tumor. Her last CT was in 2010.   She has significant CAD with 6 stents and a history of CHF. She has a pacemaker which she states has a nonfunctioning battery and she is not currently paced. This was placed for tachycadia-bradycardia syndrome. She is obese with OSA. She has atrial fibrillation and is on coumadin.   In October, 2018 she began noting intermittent right lower quadrant pains. Labs at that time showed:  CA 125 12/01/16: 35 Inhibin B 11/21/16:  119  CT abd/pelvis 12/09/16: A small suprapubic hernia is seen along the inferior margin of the surgical mass containing a loop of small bowel. No evidence of bowel obstruction or ischemia. Multiple new soft tissue masses are seen in the left suprapubic region and left lower quadrant, consistent with recurrent tumor. Largest in the left suprapubic region measures 3.8 x 2.8 cm, and 3.8 x 3.4 cm in left lower quadrant. There is also a new mass in the central small bowel mesenteric measuring 5.8 x 5.0 cm. These findings are consistent with recurrent carcinoma and peritoneal metastases.  She was provided with 3 options for proceeding with treatment of her recurrence:  1/ surgical resection and hernia repair. This will involve exploratory laparotomy, radical tumor debulking, possible bowel resection, possible partial cystectomy, and hernia repair with Dr Excell Seltzer and myself.  She will require medical clearance as she has significant cardiac issues. 2/ chemotherapy with carboplatin and paclitaxel. I discussed that this may not achieve as long a remission as surgical intervention, but would involve a large radical procedure.  3/ A third alternative was hormonal therapy with progestin and tamoxifen in rotation.  She was not sure about which route to pursue, and given that she was asymptomatic with this slow growing tumor, took some time to reflect. In December, 2018 she received replacement of her pacemaker and was placed on Plavix and coumadin. She met with her cardiologists, Dr Patty [name] and Dr Patty [name], who felt that she was a candidate for any of the approaches, however, would prefer until she was 1 year out from her last coronary catheterization procedure to be  taken off anticoagulants.  CT abd/pelvis in February, 2019 showed essentially stable disease with some mild increases in tumor size, and some decreased, but no new lesions. As she was asymptomatic she elected for expectant management at that time with  plan for surgery after she was 1 year out from prior cardiac cath.  On 10/12/17 she was admitted for 5 days with intraperitoneal bleeding from her granulosa cell tumor masses. Her anticoagulant therapy was held as was her plavix and she was transfused. CT imaging on 10/16/17 showed the previously demonstrated 9.4 x 9.2 cm left anterior pelvic hemorrhage currently measures 9.5 x 7.3 cm and is less dense. More anteriorly located hemorrhage on the left previously measures 15.6 x 4.4 cm and currently measures 17.3 x 2.8 cm. A previously demonstrated 2.8 cm cystic and solid midline anterior peritoneal mass at the level of the upper pelvis measures 3.0 cm. The previously demonstrated 8.2 x 5.6 cm cystic and solid left pelvic mass currently measures 7.8 x 5.8 cm. There is less free peritoneal blood in the upper abdomen. Anterior hernia repair mesh is again demonstrated. A ventral hernia at the level of the inferior pelvis containing herniated small bowel without obstruction is again demonstrated. This also contains a small amount the urinary bladder.  Given the progression of her peritoneal disease on imaging, she was felt to be a better candidate for chemotherapy first with carboplatin and paclitaxel, followed by consideration for interval debulking if she demonstrates a good response.  10/29/17-03/30/18 she received single agent carboplatin x 7 doses. She was admitted for heart failure after cycle 1. She otherwise tolerated therapy well.  CT imaging showed response, and in March, 2020 showed progressive response: Multiple intraperitoneal implants, overall similar in size, number and distribution to prior study from 01/04/2018. Likewise, the right lower lobe pulmonary nodules also stable. No definite progression of disease on today's examination.  Inhibin B on 06/02/18: 150  At this point she was changed to anastrazole therapy from chemotherapy given her stable disease.  Patty [name] reported that she had taken only a  few days of anastazole due to intolerance (nausea).  Inhbin B from 09/01/18 119.4  CT abd/pelvis from 09/03/18 showed stable disease with slight decrease in one of the masses.  As she poorly tolerated anastrozole, when she was seen in August 2020 she was changed to Megace progestin therapy which was prescribed.  However she did not receive any of this medication as insurance would not cover it.  Therefore in September 2020 she was prescribed tamoxifen which she began daily.  She was admitted to hospital at least twice in the fall 2020 for episodes related to cardiac issues.  These have now resolved and she has been placed on cardiac rehab at home as an outpatient is doing much better.  She reports an excellent quality of life excellent mobility, no pain, no hot flashes.  She is planning to travel to [place] to see her daughter and son-in-law for the holidays and stay there until January 2021.  She reported that she likes the current regimen she is on.  Inhibin B on December 09, 2018 was slightly increased at 166.3.    CT scan of the abdomen and pelvis on December 09, 2018 revealed very slight increase in size of a spiculated nodule in the right lung base adjacent to the right hemidiaphragm now measuring 12 mm (in December 2019 it had measured 10 mm).  The remainder of the peritoneal masses were stable in size including the largest which   was a 5.5 cm mass in the left lower quadrant.  No new sites of disease.  Interval Hx:  Inhibin B was elevated at 324 on 02/28/19. She reported no symptoms concerning for recurrence other than fatigue. She was tolerating the Tamoxifen very well and denied side effects.   Current Meds:  Outpatient Encounter Medications as of 03/25/2019  Medication Sig  . acetaminophen (TYLENOL) 500 MG tablet Take 500 mg by mouth every 6 (six) hours as needed for mild pain or headache.   Marland Kitchen atorvastatin (LIPITOR) 80 MG tablet TAKE 1 TABLET BY MOUTH EVERY DAY IN THE MORNING  .  beta carotene w/minerals (OCUVITE) tablet Take 1 tablet by mouth 2 (two) times daily.   . Calcium Carb-Cholecalciferol (CALTRATE 600+D) 600-800 MG-UNIT TABS Take 1 tablet by mouth every evening.   . clopidogrel (PLAVIX) 75 MG tablet TAKE 1 TABLET BY MOUTH EVERY DAY  . Coenzyme Q-10 100 MG capsule Take 200 mg by mouth at bedtime.   . docusate sodium (COLACE) 100 MG capsule Take 100 mg by mouth daily as needed for mild constipation (IF NO B/M AFTER 2 DAYS).   Marland Kitchen dofetilide (TIKOSYN) 125 MCG capsule Take 1 capsule (125 mcg total) by mouth 2 (two) times daily.  . furosemide (LASIX) 40 MG tablet Take 2 tablets by mouth every morning and 2 additional tabs as needed for wt gain/abdominal distension  . gabapentin (NEURONTIN) 300 MG capsule TAKE 1 CAPSULE BY MOUTH TWICE A DAY  . isosorbide mononitrate (IMDUR) 60 MG 24 hr tablet TAKE 1 TABLET BY MOUTH EVERY DAY  . levofloxacin (LEVAQUIN) 500 MG tablet Take 500 mg by mouth daily.  Marland Kitchen lidocaine-prilocaine (EMLA) cream Apply to PAC site 1-2 hours prior to access  . losartan (COZAAR) 50 MG tablet Take 1 tablet (50 mg total) by mouth daily.  . metoprolol (TOPROL-XL) 200 MG 24 hr tablet Take 1 tablet (200 mg total) by mouth daily. Take with or immediately following a meal.  . metoprolol tartrate (LOPRESSOR) 100 MG tablet Take 100 mg by mouth 2 (two) times daily.  . Multiple Vitamin (MULTIVITAMIN) capsule Take 1 capsule by mouth daily.  . nitroGLYCERIN (NITROSTAT) 0.4 MG SL tablet PLACE 1 TABLET (0.4 MG TOTAL) UNDER THE TONGUE EVERY 5 (FIVE) MINUTES AS NEEDED FOR CHEST PAIN  . Omega-3 Fatty Acids (FISH OIL) 1200 MG CAPS Take 1,200 mg by mouth daily.   . potassium chloride SA (KLOR-CON) 20 MEQ tablet Take 1 tablet (20 mEq total) by mouth 2 (two) times daily.  . tamoxifen (NOLVADEX) 10 MG tablet Take 1 tablet (10 mg total) by mouth daily.  Marland Kitchen warfarin (COUMADIN) 2 MG tablet TAKE AS DIRECTED BY COUMADIN CLINIC   No facility-administered encounter medications on file  as of 03/25/2019.    Allergy:  Allergies  Allergen Reactions  . Bee Venom Anaphylaxis  . Other Nausea And Vomiting and Other (See Comments)    Pain medications cause severe vomiting. Tolerated slow IV morphine drip  . Oxycodone Hcl Nausea And Vomiting  . Amiodarone Nausea Only  . Prednisone Palpitations and Other (See Comments)    "Rapid Heart Beat"    Social Hx:   Social History   Socioeconomic History  . Marital status: Widowed    Spouse name: Not on file  . Number of children: 4  . Years of education: Not on file  . Highest education level: Not on file  Occupational History  . Occupation: Retired Nurse, learning disability estate  Tobacco Use  . Smoking status:  Former Smoker    Packs/day: 1.00    Years: 32.00    Pack years: 32.00    Types: Cigarettes    Quit date: 02/03/1974    Years since quitting: 45.1  . Smokeless tobacco: Never Used  Substance and Sexual Activity  . Alcohol use: Yes    Alcohol/week: 7.0 standard drinks    Types: 7 Glasses of wine per week    Comment: one glass wine nightly with dinner  . Drug use: No  . Sexual activity: Never  Other Topics Concern  . Not on file  Social History Narrative   Lives alone.   Social Determinants of Health   Financial Resource Strain:   . Difficulty of Paying Living Expenses: Not on file  Food Insecurity:   . Worried About Running Out of Food in the Last Year: Not on file  . Ran Out of Food in the Last Year: Not on file  Transportation Needs:   . [name] of Transportation (Medical): Not on file  . [name] of Transportation (Non-Medical): Not on file  Physical Activity:   . Days of Exercise per Week: Not on file  . Minutes of Exercise per Session: Not on file  Stress:   . Feeling of Stress : Not on file  Social Connections:   . Frequency of Communication with Friends and Family: Not on file  . Frequency of Social Gatherings with Friends and Family: Not on file  . Attends Religious Services: Not on file  . Active Member  of Clubs or Organizations: Not on file  . Attends Club or Organization Meetings: Not on file  . Marital Status: Not on file  Intimate Partner Violence:   . Fear of Current or Ex-Partner: Not on file  . Emotionally Abused: Not on file  . Physically Abused: Not on file  . Sexually Abused: Not on file    Past Surgical Hx:  Past Surgical History:  Procedure Laterality Date  . ABDOMINAL HYSTERECTOMY    . CARDIAC CATHETERIZATION  09/03/2007   EF 70%; Failed attempt at PCI to OM  . CARDIAC CATHETERIZATION  11/01/2003   EF 70%  . CARDIAC CATHETERIZATION N/A 12/14/2015   Procedure: Left Heart Cath and Coronary Angiography;  Surgeon: Christopher D McAlhany, MD;  Location: [facility];  Service: Cardiovascular;  Laterality: N/A;  . CARDIOVERSION  12/31/2011   Procedure: CARDIOVERSION;  Surgeon: Jayadeep S. Varanasi, MD;  Location: [facility];  Service: Cardiovascular;  Laterality: N/A;  . CARDIOVERSION N/A 12/31/2011   Procedure: CARDIOVERSION;  Surgeon: Jayadeep S Varanasi, MD;  Location: [facility];  Service: Cardiovascular;  Laterality: N/A;  . CARDIOVERSION N/A 11/15/2018   Procedure: CARDIOVERSION;  Surgeon: Nelson, Katarina H, MD;  Location: [facility];  Service: Cardiovascular;  Laterality: N/A;  . CATARACT EXTRACTION, BILATERAL  2015  . CHOLECYSTECTOMY  1980's  . COLON SURGERY  2004   colectomy for diverticulosis  . CORONARY ANGIOPLASTY WITH STENT PLACEMENT  2000    and 08/11/2012; 11/12/2012: 3 + 2 LAD & CFX; 2nd CFX stent 11/12/2012  . CORONARY ATHERECTOMY N/A 07/15/2016   Procedure: Coronary Atherectomy;  Surgeon: Jordan, Peter M, MD;  Location: [facility];  Service: Cardiovascular;  Laterality: N/A;  . CORONARY BALLOON ANGIOPLASTY N/A 07/14/2016   Procedure: Coronary Balloon Angioplasty;  Surgeon: Jordan, Peter M, MD;  Location: [facility];  Service: Cardiovascular;  Laterality: N/A;  . CORONARY STENT INTERVENTION N/A 07/15/2016   Procedure: Coronary Stent  Intervention;  Surgeon: Jordan,   Peter M, MD;  [facility]: [facility];  Service: Cardiovascular;  Laterality: N/A;  . FRACTIONAL FLOW RESERVE WIRE  10/07/2013   Procedure: FRACTIONAL FLOW RESERVE WIRE;  Surgeon: Jayadeep S Varanasi, MD;  Location: [facility];  Service: Cardiovascular;;  . HERNIA REPAIR  2005   "laparoscopic"  . IR IMAGING GUIDED PORT INSERTION  10/28/2017  . LEFT HEART CATH AND CORONARY ANGIOGRAPHY N/A 07/14/2016   Procedure: Left Heart Cath and Coronary Angiography;  Surgeon: Jordan, Peter M, MD;  [facility];  Service: Cardiovascular;  Laterality: N/A;  . LEFT HEART CATH AND CORONARY ANGIOGRAPHY N/A 11/10/2018   Procedure: LEFT HEART CATH AND CORONARY ANGIOGRAPHY;  Surgeon: Arida, Muhammad A, MD;  [facility];  Service: Cardiovascular;  Laterality: N/A;  . LEFT HEART CATHETERIZATION WITH CORONARY ANGIOGRAM N/A 11/12/2012   Procedure: LEFT HEART CATHETERIZATION WITH CORONARY ANGIOGRAM;  Surgeon: Jayadeep S Varanasi, MD;  [facility];  Service: Cardiovascular;  Laterality: N/A;  . LEFT HEART CATHETERIZATION WITH CORONARY ANGIOGRAM N/A 10/07/2013   Procedure: LEFT HEART CATHETERIZATION WITH CORONARY ANGIOGRAM;  Surgeon: Jayadeep S Varanasi, MD;  [facility]: [facility];  Service: Cardiovascular;  Laterality: N/A;  . LEFT HEART CATHETERIZATION WITH CORONARY ANGIOGRAM N/A 12/14/2013   Procedure: LEFT HEART CATHETERIZATION WITH CORONARY ANGIOGRAM;  Surgeon: Henry W Smith III, MD;  [facility]: [facility];  Service: Cardiovascular;  Laterality: N/A;  . LEFT HEART CATHETERIZATION WITH CORONARY ANGIOGRAM N/A 05/16/2014   Procedure: LEFT HEART CATHETERIZATION WITH CORONARY ANGIOGRAM;  Surgeon: Michael Cooper, MD;  [facility]: [facility];  Service: Cardiovascular;  Laterality: N/A;  . PERCUTANEOUS CORONARY INTERVENTION-BALLOON ONLY  08/04/2012   Procedure: PERCUTANEOUS CORONARY INTERVENTION-BALLOON ONLY;  Surgeon: Jayadeep S Varanasi, MD;  [facility];  Service: Cardiovascular;;  . PERCUTANEOUS CORONARY ROTOBLATOR INTERVENTION (PCI-R) N/A 08/11/2012   Procedure: PERCUTANEOUS CORONARY ROTOBLATOR INTERVENTION (PCI-R);  Surgeon: Jayadeep S Varanasi, MD;  [facility];  Service: Cardiovascular;  Laterality: N/A;  . PERCUTANEOUS CORONARY STENT INTERVENTION (PCI-S)  10/07/2013   Procedure: PERCUTANEOUS CORONARY STENT INTERVENTION (PCI-S);  Surgeon: Jayadeep S Varanasi, MD;  Location: [facility];  Service: Cardiovascular;;  . PERMANENT PACEMAKER INSERTION N/A 03/16/2012   [name] (SJM) leadless pacemaker (LEADLESS II STUDY PATEINT)  . SALIVARY GLAND SURGERY  2000's   "had a little lump removed; granulosa related; it was benign" (08/11/2012)  . TEE WITHOUT CARDIOVERSION  12/31/2011   Procedure: TRANSESOPHAGEAL ECHOCARDIOGRAM (TEE);  Surgeon: Jayadeep S. Varanasi, MD;  Location: [facility];  Service: Cardiovascular;  Laterality: N/A;  . UMBILICAL GRANULOMA EXCISION  2000   2003; 2004; 2007: "all in my abdomen including small intestines, outside my ?uterus/etc" (08/11/2012)  . VARICOSE VEIN SURGERY Bilateral 1977    Past Medical Hx:  Past Medical History:  Diagnosis Date  . Bleeding behind the abdominal cavity 10/2017  . Chronic anticoagulation - coumadin, CHADS2VASC=6 05/17/2015  . CKD (chronic kidney disease), stage III   . Combined systolic and diastolic heart failure ([name])   . Coronary artery disease    a. s/p multiple stents - stenting x 2 to the LAD and x 1 to the LCx in 2000, rotational arthrectomy to proximal LCx and DES to LAD in 2014 and DES to LCx in 2014, along with DES to LAD for re-in-stent stenosis in 2015, and DES to ostial LAD in 2016. b. 07/2016 - orbital atherectomy & DES to mid LCx.  . Diverticulitis   . Granulosa cell carcinoma ([name])      abd; last episode was in 2009  . Hyperlipidemia   . Hypertension   . LBBB (left bundle branch block)   . Myocardial infarction ([name]) 2002  . Obesity   . OSA on CPAP   .  Ovarian ca ([name]) 2019  . Pacemaker failure    a. Prior leadless PPM with premature battery failure, being managed conservatively without replacement.  . Peripheral vascular disease ([name])   . Permanent atrial fibrillation ([name]) 2013  . Renal artery stenosis ([name])   . Tachycardia-bradycardia syndrome ([name])    a. s/p leadless pacemaker ([name]) implanted by Dr Allred  . Varicose veins     Past Gynecological History:  Granulosa cell tumor of the ovary No LMP recorded. Patient has had a hysterectomy.  Family Hx:  Family History  Problem Relation Age of Onset  . Stroke Mother   . Heart attack Father   . Diabetes Father   . Hypertension Father   . Heart attack Brother   . Diabetes Brother   . Hypertension Brother   . Kidney failure Brother     Review of Systems:  Constitutional  Feels fatigued    ENT Normal appearing ears and nares bilaterally Skin/Breast  No rash, sores, jaundice, itching, dryness Cardiovascular  No chest pain, shortness of breath, or edema  Pulmonary  No cough or wheeze.  Gastro Intestinal  No nausea, vomitting, or diarrhoea. No bright red blood per rectum, no abdominal pain, change in bowel movement, or constipation.  Genito Urinary  No frequency, urgency, dysuria, no pelvic pain Musculo Skeletal  No myalgia, arthralgia, joint swelling or pain  Neurologic  No weakness, change in gait, + neuropathy Psychology  No depression, anxiety, insomnia.   Vitals:  Blood pressure (!) 137/41, pulse 66, temperature 98.7 F (37.1 C), resp. rate 18, weight 174 lb 4 oz (79 kg).  Physical Exam: WD in NAD Neck  Supple NROM, without any enlargements.  Lymph Node Survey No cervical supraclavicular or inguinal adenopathy Cardiovascular  Pulse normal rate, regularity and rhythm. S1 and S2 normal.  Lungs  Clear to auscultation bilateraly, without wheezes/crackles/rhonchi. Good air movement.  Skin  No rash/lesions/breakdown  Psychiatry  Alert and oriented to  person, place, and time  Abdomen  Normoactive bowel sounds, abdomen soft, non-tender and obese. 10cm mobile mass in the hernia is appreciated in the lower left abdomen.  Back No CVA tenderness Genito Urinary  No palpable masses on pelvic examination Rectal  deferred Extremities  No bilateral cyanosis, clubbing or edema.  Emma C Rossi, MD  03/25/2019, 2:07 PM    

## 2019-03-28 ENCOUNTER — Telehealth: Payer: Self-pay | Admitting: Interventional Cardiology

## 2019-03-28 ENCOUNTER — Telehealth: Payer: Self-pay | Admitting: Oncology

## 2019-03-28 NOTE — Telephone Encounter (Signed)
Left a message regarding appointment with Dr. Alvy Bimler.  Reqested a return call.

## 2019-03-28 NOTE — Telephone Encounter (Signed)
Patient is calling back in regards to lab results. Patient states if she is unable to answer a detailed message can be left. Please advise.

## 2019-03-28 NOTE — Telephone Encounter (Signed)
Returned call to Pt.  Lab results given to Pt.  Pt states she knew her fluid level was better "because I have the ankles of a model".

## 2019-03-28 NOTE — Telephone Encounter (Signed)
Madelle called back and scheduled appointment on 04/08/19 at 1:00 pm.

## 2019-03-30 ENCOUNTER — Other Ambulatory Visit: Payer: Self-pay

## 2019-03-30 MED ORDER — FUROSEMIDE 40 MG PO TABS: ORAL_TABLET | ORAL | 2 refills | Status: DC

## 2019-03-31 ENCOUNTER — Other Ambulatory Visit: Payer: Self-pay

## 2019-03-31 ENCOUNTER — Telehealth: Payer: Self-pay | Admitting: Interventional Cardiology

## 2019-03-31 MED ORDER — LOSARTAN POTASSIUM 50 MG PO TABS: 50.0000 mg | ORAL_TABLET | Freq: Every day | ORAL | 3 refills | Status: DC

## 2019-03-31 NOTE — Telephone Encounter (Signed)
New message   Patient has a questions about the dosage for losartan (COZAAR) 50 MG tablet. Please call.

## 2019-03-31 NOTE — Telephone Encounter (Signed)
I spoke to the patient who called to verify that Dr Irish Lack wants her to take Losartan 50 mg Daily, not 100 mg Daily.  She apparently was confused at the 2/2 OV and had thought she was to take 100 mg Daily.    I told her that I clearly see in the note that he wanted her to increase the Losartan to 50 mg Daily.  She clarified this with her pharmacy and is now taking 50 mg Daily.

## 2019-04-01 DIAGNOSIS — L03115 Cellulitis of right lower limb: Secondary | ICD-10-CM | POA: Diagnosis not present

## 2019-04-01 NOTE — Telephone Encounter (Signed)
Called and spoke to patient. She states that she is taking losartan 50 mg QD now. Patient states that her swelling and SOB are much improved. We originally wanted her to follow up in March however since she is feeling well we will see her in June. Appointment made for 6/21 at 2:00 PM. She will let us know if her Sx change or worsen before then.

## 2019-04-02 ENCOUNTER — Other Ambulatory Visit: Payer: Self-pay | Admitting: Interventional Cardiology

## 2019-04-03 DIAGNOSIS — L03115 Cellulitis of right lower limb: Secondary | ICD-10-CM | POA: Diagnosis not present

## 2019-04-05 ENCOUNTER — Telehealth: Payer: Self-pay

## 2019-04-05 NOTE — Telephone Encounter (Signed)
She called about CT scan for tomorrow. She needs to reschedule scan and appt with Dr. Alvy Bimler. She is taking antibiotic for leg wound for 10 days. Usually when she drinks the contrast she has diarrhea. She is afraid she will have a accident with CT scan. Would like appt rescheduled to after 3/10.  CT scan rescheduled on 3/11 at 2:30, arrive at 2:15. Appt with Dr. Alvy Bimler at 11:30 on 3/12. Called and left a message on voicemail with new appts. Ask her to call the office if needed.

## 2019-04-06 ENCOUNTER — Ambulatory Visit (HOSPITAL_COMMUNITY): Payer: Medicare Other

## 2019-04-06 DIAGNOSIS — Z23 Encounter for immunization: Secondary | ICD-10-CM | POA: Diagnosis not present

## 2019-04-08 ENCOUNTER — Ambulatory Visit: Payer: Medicare Other | Admitting: Hematology and Oncology

## 2019-04-11 ENCOUNTER — Other Ambulatory Visit: Payer: Self-pay | Admitting: Oncology

## 2019-04-11 NOTE — Progress Notes (Signed)
Gynecologic Oncology Multi-Disciplinary Disposition Conference Note  Date of the Conference: 04/11/2019  Patient Name: Patty [name]  Primary GYN Oncologist: Dr. Denman George  Stage/Disposition:  Recurrent granulosa cell tumor of the ovary. Disposition is to alternating tamoxifen and megace or chemotherapy.   This Multidisciplinary conference took place involving physicians from [place], [place], Radiation Oncology, Pathology, Radiology along with the Gynecologic Oncology Nurse Practitioner and RN.  Comprehensive assessment of the patient's malignancy, staging, need for surgery, chemotherapy, radiation therapy, and need for further testing were reviewed. Supportive measures, both inpatient and following discharge were also discussed. The recommended plan of care is documented. Greater than 35 minutes were spent correlating and coordinating this patient's care.

## 2019-04-14 ENCOUNTER — Other Ambulatory Visit: Payer: Self-pay

## 2019-04-14 ENCOUNTER — Ambulatory Visit (HOSPITAL_COMMUNITY)
Admission: RE | Admit: 2019-04-14 | Discharge: 2019-04-14 | Disposition: A | Discharge status: Home / Self Care | Payer: Medicare Other | Source: Ambulatory Visit | Attending: Gynecologic Oncology | Admitting: Gynecologic Oncology

## 2019-04-14 DIAGNOSIS — R1909 Other intra-abdominal and pelvic swelling, mass and lump: Secondary | ICD-10-CM | POA: Diagnosis not present

## 2019-04-14 DIAGNOSIS — C562 Malignant neoplasm of left ovary: Secondary | ICD-10-CM | POA: Diagnosis not present

## 2019-04-14 DIAGNOSIS — C569 Malignant neoplasm of unspecified ovary: Secondary | ICD-10-CM | POA: Diagnosis not present

## 2019-04-14 MED ORDER — SODIUM CHLORIDE (PF) 0.9 % IJ SOLN
INTRAMUSCULAR | Status: AC
  Filled 2019-04-14: qty 50

## 2019-04-14 MED ORDER — IOHEXOL 300 MG/ML  SOLN
100.0000 mL | Freq: Once | INTRAMUSCULAR | Status: AC | PRN
  Administered 2019-04-14: 100 mL via INTRAVENOUS

## 2019-04-15 ENCOUNTER — Other Ambulatory Visit: Payer: Self-pay

## 2019-04-15 ENCOUNTER — Encounter: Payer: Self-pay | Admitting: Hematology and Oncology

## 2019-04-15 ENCOUNTER — Inpatient Hospital Stay: Payer: Medicare Other | Attending: Hematology and Oncology | Admitting: Hematology and Oncology

## 2019-04-15 DIAGNOSIS — C786 Secondary malignant neoplasm of retroperitoneum and peritoneum: Secondary | ICD-10-CM | POA: Diagnosis present

## 2019-04-15 DIAGNOSIS — I5040 Unspecified combined systolic (congestive) and diastolic (congestive) heart failure: Secondary | ICD-10-CM | POA: Insufficient documentation

## 2019-04-15 DIAGNOSIS — D3910 Neoplasm of uncertain behavior of unspecified ovary: Secondary | ICD-10-CM | POA: Diagnosis not present

## 2019-04-15 DIAGNOSIS — Z66 Do not resuscitate: Secondary | ICD-10-CM | POA: Diagnosis not present

## 2019-04-15 DIAGNOSIS — Z9221 Personal history of antineoplastic chemotherapy: Secondary | ICD-10-CM | POA: Insufficient documentation

## 2019-04-15 DIAGNOSIS — Z7189 Other specified counseling: Secondary | ICD-10-CM

## 2019-04-15 DIAGNOSIS — Z90722 Acquired absence of ovaries, bilateral: Secondary | ICD-10-CM | POA: Insufficient documentation

## 2019-04-15 DIAGNOSIS — Z7901 Long term (current) use of anticoagulants: Secondary | ICD-10-CM | POA: Insufficient documentation

## 2019-04-15 DIAGNOSIS — C569 Malignant neoplasm of unspecified ovary: Secondary | ICD-10-CM | POA: Diagnosis not present

## 2019-04-15 DIAGNOSIS — Z79899 Other long term (current) drug therapy: Secondary | ICD-10-CM | POA: Insufficient documentation

## 2019-04-15 DIAGNOSIS — I700 Atherosclerosis of aorta: Secondary | ICD-10-CM | POA: Diagnosis not present

## 2019-04-15 DIAGNOSIS — I25118 Atherosclerotic heart disease of native coronary artery with other forms of angina pectoris: Secondary | ICD-10-CM

## 2019-04-15 DIAGNOSIS — Z90710 Acquired absence of both cervix and uterus: Secondary | ICD-10-CM | POA: Diagnosis not present

## 2019-04-15 DIAGNOSIS — S81811D Laceration without foreign body, right lower leg, subsequent encounter: Secondary | ICD-10-CM | POA: Diagnosis not present

## 2019-04-15 NOTE — Assessment & Plan Note (Signed)
We have extensive discussions about goals of care We discussed the risk and benefits of pursuing treatment versus observation Currently, she is not symptomatic from her peritoneal disease She is aware of prognosis with or without treatment She has advanced directives and living will in place She will call me once she has made final decision about plan of care next week

## 2019-04-15 NOTE — Progress Notes (Signed)
[name] OFFICE PROGRESS NOTE  Patient Care Team: Patty Carol, MD as PCP - General (Internal Medicine) Patty Booze, MD as PCP - Cardiology (Cardiology) Patty Grayer, MD as PCP - Electrophysiology (Cardiology) Patty Booze, MD as Consulting Physician (Cardiology) Patty Grayer, [name], [name], [name], [name], [name], [name], [name], discussions about plan of care and coordination of care plan   Patty Lark, MD 04/15/2019 5:10 PM  INTERVAL HISTORY: Please see below for problem oriented charting. She returns for further follow-up due to recent abnormal findings consistent with disease progression Since last time I saw her, [name], [name], [name], [name], hematuria or hematochezia.  SUMMARY OF ONCOLOGIC HISTORY: Oncology History  Granulosa cell tumor of ovary  02/19/1998 Initial Diagnosis   The granulosa cell tumor was initially diagnosed at [facility] in 2000, with recurrences in 2004, [name]; [name]; [name], self-limited episode of bowel obstruction in May 2012.   08/24/2002 Pathology Results   SOFT TISSUE MASS, [name], [name], [name], consistent with recurrent/metastatic granulosa cell tumor. The findings correlate with the cytology material   12/06/2002 Pathology Results   1. SMALL INTESTINE, [name], SIGMOID, SEGMENTAL RESECTION: DIVERTICULOSIS WITH FOCAL MILD CHRONIC DIVERTICULITIS. SIX BENIGN LYMPH NODES. NO TUMOR IDENTIFIED.   05/12/2005 PET scan   Again identified are enlarged lymph nodes within the right posterior cervical region and left external iliac region, [name], especially when compared with the examination dated 10/30/04.   10/01/2005 Pathology Results   1. ABDOMINAL WALL MASS: METASTATIC GRANULOSA CELL TUMOR.  2. LEFT ILIAC MASS: METASTATIC GRANULOSA CELL TUMOR.  COMMENT Both specimens are involved by metastatic tumor with features consistent with metastasis from the previously diagnosed adult granulosa cell tumor.    06/11/2007 Imaging   Stable CT the pelvis - no evidence of metastatic disease.   06/09/2008  Imaging   1.  Interval enlargement of small left perivesical nodule measuring 18 mm maximally.  This may reflect a lymph node but has a nonspecific appearance.  Attention to this area followup is recommended. 2.  Otherwise stable postsurgical appearance of the pelvis.   12/14/2008 Imaging   1.  Mild increase in size of low attenuation nodule in the left anterior pelvis indenting the urinary bladder, which now measures 2.8 cm compared with 1.8 cm on prior study.  Metastatic disease cannot be excluded. 2.  No other significant changes identified.  Stable diverticulosis and small suprapubic anterior abdominal wall hernia containing a loop of small bowel.   05/23/2009 Pathology Results   1. SOFT TISSUE MASS, SIMPLE EXCISION, : INVOLVEMENT BY NEOPLASM CONSISTENT WITH METASTATIC GRANULOSA CELL TUMOR.   06/08/2010 Imaging   1.  No acute findings. 2.  Interval progression of mild diffuse left renal atrophy and hypoperfusion, consistent with left renal artery stenosis. 3.  Stable small hiatal hernia, tiny periumbilical hernia, and small suprapubic hernia containing a loop of small bowel.  No evidence of bowel ischemia or obstruction. 4.  Diverticulosis.  No radiographic evidence of diverticulitis.   05/22/2011 Imaging   1.  Stable exam.  No new or progressive disease identified within the abdomen or pelvis. 2.  Stable small hiatal hernia and suprapubic ventral hernia. Stable left renal atrophy, likely secondary to renal artery stenosis.   11/28/2011 Imaging   1.  No findings to suggest recurrent or metastatic disease in the abdomen or pelvis on today's examination. 2.  Indeterminate liver lesion in the right lobe of the liver  is unchanged compared to numerous remote prior examinations. 3. Small suprapubic ventral hernia again noted. 4.  Extensive atherosclerosis including at least three vessel coronary artery disease.  5.  Postoperative changes, as above.    12/01/2016 Tumor Marker   Patient's  tumor was tested for the following markers: Inhibin B Results of the tumor marker test revealed 119.2   12/11/2016 Imaging   Multiple new soft tissue masses in left pelvis and central small bowel mesentery, consistent with recurrent/metastatic carcinoma.    03/25/2017 Imaging   1. Omental and mesenteric tumor implants throughout the abdomen and pelvis with mixed findings when compared the prior study. Some of these lesions are clearly smaller and others are clearly larger. No new lesions are identified. 2. Stable severe atherosclerotic calcifications. 3. Stable low anterior abdominal wall hernia. 4. Stable low-attenuation lesion involving the right hepatic lobe with adjacent calcifications or surgical clips.   10/12/2017 Imaging   Extraperitoneal fluid/hemorrhage related to the patient's dominant left pelvic tumor, as described above.  Progression of the patient's metastatic ovarian cancer, as described above. Dominant cystic/solid mass in the left adnexa/lower pelvis has increased in size. Peritoneal disease/omental caking is mildly progressive. New pulmonary nodule at the right lung base, indeterminate.    10/12/2017 - 10/17/2017 Hospital Admission   She was admitted for retroperitoneal bleed.   10/16/2017 Imaging   1. Improving peritoneal hemorrhage. 2. No significant change in cystic and solid left pelvic mass and smaller anterior peritoneal cystic and solid mass compatible with tumor implant. 3. No evidence of metastatic disease elsewhere in the abdomen or pelvis. 4. Cardiomegaly. 5.  Calcific coronary artery and aortic atherosclerosis.  Aortic Atherosclerosis (ICD10-I70.0).   10/27/2017 Tumor Marker   Patient's tumor was tested for the following markers: Inhibin B Results of the tumor marker test revealed 98.4   10/28/2017 Procedure   Status post right IJ port catheter placement.   10/29/2017 - 03/30/2018 Chemotherapy   The patient had carboplatin x 7 doses   11/24/2017 Tumor  Marker   Patient's tumor was tested for the following markers: Inhibin B Results of the tumor marker test revealed 113.6   01/04/2018 Imaging   1. The left adnexal mass appears mildly decreased in size from previous exam. Associated hemorrhage with left lower abdominal hematomas have resolved in the interval. Low-density central mesenteric lesion is also slightly decreased in size in the interval. The solid and cystic lesion along the undersurface of the ventral abdominal wall is mildly increased in size in the interval. Other peritoneal nodules are either stable or decreased in the interval. 2. Right lower lobe pulmonary nodule is unchanged. 3. No new sites of disease identified. 4. Aortic Atherosclerosis (ICD10-I70.0).   03/08/2018 Tumor Marker   Patient's tumor was tested for the following markers: Inhibin B Results of the tumor marker test revealed 135.5   04/01/2018 Tumor Marker   Patient's tumor was tested for the following markers: Inhibin B Results of the tumor marker test revealed 168.9   04/22/2018 Imaging   1. Multiple intraperitoneal implants, overall similar in size, number and distribution to prior study from 01/04/2018. Likewise, the right lower lobe pulmonary nodules also stable. No definite progression of disease on today's examination. 2. Cardiomegaly with left ventricular and left atrial dilatation. 3. Colonic diverticulosis without evidence of acute diverticulitis at this time.   04/22/2018 Tumor Marker   Patient's tumor was tested for the following markers: Inhibin B Results of the tumor marker test revealed 180   Ovarian cancer ([place])  10/19/2017 Initial Diagnosis   Ovarian cancer ([place])     REVIEW OF SYSTEMS:   Constitutional: Denies fevers, chills or abnormal weight loss Eyes: Denies blurriness of vision Ears, nose, mouth, throat, and face: Denies mucositis or sore throat Cardiovascular: Denies palpitation, chest discomfort or lower extremity  swelling Gastrointestinal:  Denies nausea, heartburn or change in bowel habits Skin: Denies abnormal skin rashes Lymphatics: Denies new lymphadenopathy or easy bruising Neurological:Denies numbness, tingling or new weaknesses Behavioral/Psych: Mood is stable, no new changes  All other systems were reviewed with the patient and are negative.  I have reviewed the past medical history, past surgical history, social history and family history with the patient and they are unchanged from previous note.  ALLERGIES:  is allergic to bee venom; other; oxycodone hcl; amiodarone; and prednisone.  MEDICATIONS:  Current Outpatient Medications  Medication Sig Dispense Refill  . acetaminophen (TYLENOL) 500 MG tablet Take 500 mg by mouth every 6 (six) hours as needed for mild pain or headache.     Marland Kitchen atorvastatin (LIPITOR) 80 MG tablet TAKE 1 TABLET BY MOUTH EVERY DAY IN THE MORNING 90 tablet 3  . beta carotene w/minerals (OCUVITE) tablet Take 1 tablet by mouth 2 (two) times daily.     . Calcium Carb-Cholecalciferol (CALTRATE 600+D) 600-800 MG-UNIT TABS Take 1 tablet by mouth every evening.     . cephALEXin (KEFLEX) 500 MG capsule Take 500 mg by mouth 2 (two) times daily.    . clopidogrel (PLAVIX) 75 MG tablet TAKE 1 TABLET BY MOUTH EVERY DAY 30 tablet 11  . Coenzyme Q-10 100 MG capsule Take 200 mg by mouth at bedtime.     . docusate sodium (COLACE) 100 MG capsule Take 100 mg by mouth daily as needed for mild constipation (IF NO B/M AFTER 2 DAYS).     Marland Kitchen dofetilide (TIKOSYN) 125 MCG capsule Take 1 capsule (125 mcg total) by mouth 2 (two) times daily. 60 capsule 6  . furosemide (LASIX) 40 MG tablet Take 2 tablets by mouth every morning and 2 additional tabs as needed for wt gain/abdominal distension 180 tablet 2  . gabapentin (NEURONTIN) 300 MG capsule TAKE 1 CAPSULE BY MOUTH TWICE A DAY 60 capsule 8  . isosorbide mononitrate (IMDUR) 60 MG 24 hr tablet TAKE 1 TABLET BY MOUTH EVERY DAY 30 tablet 11  .  lidocaine-prilocaine (EMLA) cream Apply to PAC site 1-2 hours prior to access 30 g 1  . losartan (COZAAR) 50 MG tablet Take 1 tablet (50 mg total) by mouth daily. 90 tablet 3  . metoprolol (TOPROL-XL) 200 MG 24 hr tablet Take 1 tablet (200 mg total) by mouth daily. Take with or immediately following a meal. 30 tablet 6  . Multiple Vitamin (MULTIVITAMIN) capsule Take 1 capsule by mouth daily.    . nitroGLYCERIN (NITROSTAT) 0.4 MG SL tablet PLACE 1 TABLET (0.4 MG TOTAL) UNDER THE TONGUE EVERY 5 (FIVE) MINUTES AS NEEDED FOR CHEST PAIN 75 tablet 2  . Omega-3 Fatty Acids (FISH OIL) 1200 MG CAPS Take 1,200 mg by mouth daily.     . potassium chloride SA (KLOR-CON) 20 MEQ tablet Take 1 tablet (20 mEq total) by mouth 2 (two) times daily. 180 tablet 3  . tamoxifen (NOLVADEX) 10 MG tablet Take 1 tablet (10 mg total) by mouth daily. 30 tablet 3  . warfarin (COUMADIN) 2 MG tablet TAKE AS DIRECTED BY COUMADIN CLINIC 30 tablet 3   No current facility-administered medications for this visit.    PHYSICAL EXAMINATION:  ECOG PERFORMANCE STATUS: 2 - Symptomatic, <50% confined to bed  Vitals:   04/15/19 1145  BP: (!) 151/53  Pulse: 74  Resp: 18  Temp: 97.8 F (36.6 C)  SpO2: 95%   Filed Weights   04/15/19 1145  Weight: 177 lb 9.6 oz (80.6 kg)    GENERAL:alert, no distress and comfortable NEURO: alert & oriented x 3 with fluent speech, no focal motor/sensory deficits  LABORATORY DATA:  I have reviewed the data as listed    Component Value Date/Time   NA 144 03/22/2019 1502   NA 142 12/14/2014 1324   K 4.1 03/22/2019 1502   K 4.2 12/14/2014 1324   CL 102 03/22/2019 1502   CL 105 05/26/2012 1111   CO2 27 03/22/2019 1502   CO2 30 (H) 12/14/2014 1324   GLUCOSE 95 03/22/2019 1502   GLUCOSE 80 12/09/2018 1322   GLUCOSE 130 12/14/2014 1324   GLUCOSE 89 05/26/2012 1111   BUN 20 03/22/2019 1502   BUN 19.6 12/01/2016 1116   CREATININE 1.08 (H) 03/22/2019 1502   CREATININE 0.86 12/17/2017 0828    CREATININE 0.9 12/01/2016 1116   CALCIUM 8.9 03/22/2019 1502   CALCIUM 9.1 12/14/2014 1324   PROT 6.7 10/09/2018 0654   PROT 6.5 09/14/2018 1442   PROT 6.5 12/14/2014 1324   ALBUMIN 3.0 (L) 10/09/2018 0654   ALBUMIN 3.9 09/14/2018 1442   ALBUMIN 3.4 (L) 12/14/2014 1324   AST 27 10/09/2018 0654   AST 25 12/17/2017 0828   AST 29 12/14/2014 1324   ALT 30 10/09/2018 0654   ALT 26 12/17/2017 0828   ALT 20 12/14/2014 1324   ALKPHOS 86 10/09/2018 0654   ALKPHOS 103 12/14/2014 1324   BILITOT 1.3 (H) 10/09/2018 0654   BILITOT 0.7 09/14/2018 1442   BILITOT 0.5 12/17/2017 0828   BILITOT 0.77 12/14/2014 1324   GFRNONAA 47 (L) 03/22/2019 1502   GFRNONAA 60 (L) 12/17/2017 0828   GFRAA 54 (L) 03/22/2019 1502   GFRAA >60 12/17/2017 0828    No results found for: SPEP, UPEP  Lab Results  Component Value Date   WBC 5.4 03/08/2019   NEUTROABS 4.0 11/09/2018   HGB 11.9 03/08/2019   HCT 35.1 03/08/2019   MCV 98 (H) 03/08/2019   PLT 180 03/08/2019      Chemistry      Component Value Date/Time   NA 144 03/22/2019 1502   NA 142 12/14/2014 1324   K 4.1 03/22/2019 1502   K 4.2 12/14/2014 1324   CL 102 03/22/2019 1502   CL 105 05/26/2012 1111   CO2 27 03/22/2019 1502   CO2 30 (H) 12/14/2014 1324   BUN 20 03/22/2019 1502   BUN 19.6 12/01/2016 1116   CREATININE 1.08 (H) 03/22/2019 1502   CREATININE 0.86 12/17/2017 0828   CREATININE 0.9 12/01/2016 1116      Component Value Date/Time   CALCIUM 8.9 03/22/2019 1502   CALCIUM 9.1 12/14/2014 1324   ALKPHOS 86 10/09/2018 0654   ALKPHOS 103 12/14/2014 1324   AST 27 10/09/2018 0654   AST 25 12/17/2017 0828   AST 29 12/14/2014 1324   ALT 30 10/09/2018 0654   ALT 26 12/17/2017 0828   ALT 20 12/14/2014 1324   BILITOT 1.3 (H) 10/09/2018 0654   BILITOT 0.7 09/14/2018 1442   BILITOT 0.5 12/17/2017 0828   BILITOT 0.77 12/14/2014 1324       RADIOGRAPHIC STUDIES: I have reviewed multiple imaging studies with the patient I have personally  reviewed  the radiological images as listed and agreed with the findings in the report. CT Abdomen Pelvis W Contrast  Result Date: 04/14/2019 CLINICAL DATA:  Restaging ovarian cancer EXAM: CT ABDOMEN AND PELVIS WITH CONTRAST TECHNIQUE: Multidetector CT imaging of the abdomen and pelvis was performed using the standard protocol following bolus administration of intravenous contrast. CONTRAST:  16mL OMNIPAQUE IOHEXOL 300 MG/ML  SOLN COMPARISON:  12/09/2018 FINDINGS: Lower chest: There is a small right pleural effusion which is new from previous exam. Anterior right lung base nodule which extends across the fissure into the right middle lobe measures 2.2 cm, image 15/4. Previously 1.2 cm. Hepatobiliary: Low-attenuation and calcification along the posterior margin of the right hemi liver is unchanged measuring 2.2 cm, image 24/2. Previous cholecystectomy. Mild fusiform dilatation of the CBD measures up to 1.1 cm. No choledocholithiasis. Pancreas: Unremarkable. No pancreatic ductal dilatation or surrounding inflammatory changes. Spleen: Normal in size without focal abnormality. Adrenals/Urinary Tract: Normal appearance of the adrenal glands. There is asymmetric atrophy of the left kidney, unchanged. Scarring involving the right kidney noted. No mass or hydronephrosis identified bilaterally. The urinary bladder is unremarkable. Stomach/Bowel: Small hiatal hernia. The small bowel loops are nondilated. No bowel wall thickening, inflammation or distension. The appendix is visualized and appears normal. Vascular/Lymphatic: Aortic atherosclerosis. No aneurysm. No retroperitoneal or mesenteric adenopathy identified. No pelvic or inguinal adenopathy identified. Reproductive: Status post hysterectomy. Other: Soft tissue mass within the left iliac fossa measures 6.4 x 3.8 cm, image 66/2. Previously 5.5 x 3.9 cm. Soft tissue implant within the ventral abdomen measures 2.7 x 1.1 cm, image 53/2. Previously 2.3 by 0.9 cm. Within  the lower pelvis there is a lesion measuring 1.8 x 1.5 cm, image 71/2. Unchanged from previous exam. No ascites. Marked laxity of the ventral abdominal wall is identified with evidence of previous herniorrhaphy. Small hernia within the lower portions of the ventral abdominal wall is again noted containing a nonobstructed loop of small bowel, image 63/6. Musculoskeletal: No acute or significant osseous findings. Anterolisthesis of L5 on S1 noted. There is degenerative disc disease at L5-S1 and L2-3. IMPRESSION: 1. Interval progression of disease. There has been interval increase in size of left iliac fossa mass. Additionally, there is a soft tissue implant within the ventral abdomen which has increased in size in the interval. The previously noted right lower lobe pulmonary nodule has also increased in size. 2. New small right pleural effusion. Aortic Atherosclerosis (ICD10-I70.0). Electronically Signed   By: Kerby Moors M.D.   On: 04/14/2019 16:41

## 2019-04-15 NOTE — Assessment & Plan Note (Signed)
I have reviewed multiple imaging studies with the patient and also given her copies of her most recent CT scan Previously, [name], her performance status is poor due to her chronic history of congestive heart failure She could barely compensate with aggressive medical management We discussed the risk and benefits of repeat treatment with single agent carboplatin versus other form of hormonal manipulation She will call me once she has made her decision sometime next week

## 2019-04-15 NOTE — Assessment & Plan Note (Signed)
She has significant history of congestive heart failure She has very poor performance status with shortness of breath on minimal exertion She will continue medical management

## 2019-04-22 ENCOUNTER — Other Ambulatory Visit: Payer: Self-pay | Admitting: Interventional Cardiology

## 2019-04-22 ENCOUNTER — Other Ambulatory Visit (HOSPITAL_COMMUNITY): Payer: Self-pay | Admitting: Nurse Practitioner

## 2019-04-23 ENCOUNTER — Other Ambulatory Visit: Payer: Self-pay | Admitting: Hematology and Oncology

## 2019-04-25 ENCOUNTER — Telehealth: Payer: Self-pay | Admitting: Oncology

## 2019-04-25 NOTE — Telephone Encounter (Signed)
Patty [name] said she has decided not to pursue chemotherapy.  She is interesting in hormonal therapy and is currently taking tamoxifen.  She does not remember if she has taken megace before and wants to know more about hormonal therapy options.  Also notified her that the gabapentin refill has been sent to [facility].

## 2019-04-26 NOTE — Telephone Encounter (Signed)
Scheduled appointment with Stanton Kidney for 05/03/19 at 11:15.

## 2019-04-26 NOTE — Telephone Encounter (Signed)
I suggest seeing her next week on 3/30 at 1015 or 1115 to discuss option Need 45 mins

## 2019-05-03 ENCOUNTER — Ambulatory Visit (INDEPENDENT_AMBULATORY_CARE_PROVIDER_SITE_OTHER): Payer: Medicare Other | Admitting: Pharmacist Clinician (PhC)/ Clinical Pharmacy Specialist

## 2019-05-03 ENCOUNTER — Encounter: Payer: Self-pay | Admitting: Hematology and Oncology

## 2019-05-03 ENCOUNTER — Inpatient Hospital Stay (HOSPITAL_BASED_OUTPATIENT_CLINIC_OR_DEPARTMENT_OTHER): Payer: Medicare Other | Admitting: Hematology and Oncology

## 2019-05-03 ENCOUNTER — Telehealth: Payer: Self-pay

## 2019-05-03 ENCOUNTER — Other Ambulatory Visit: Payer: Self-pay

## 2019-05-03 DIAGNOSIS — Z7189 Other specified counseling: Secondary | ICD-10-CM

## 2019-05-03 DIAGNOSIS — Z90722 Acquired absence of ovaries, bilateral: Secondary | ICD-10-CM | POA: Diagnosis not present

## 2019-05-03 DIAGNOSIS — C569 Malignant neoplasm of unspecified ovary: Secondary | ICD-10-CM | POA: Diagnosis not present

## 2019-05-03 DIAGNOSIS — Z90710 Acquired absence of both cervix and uterus: Secondary | ICD-10-CM | POA: Diagnosis not present

## 2019-05-03 DIAGNOSIS — I4891 Unspecified atrial fibrillation: Secondary | ICD-10-CM | POA: Diagnosis not present

## 2019-05-03 DIAGNOSIS — I700 Atherosclerosis of aorta: Secondary | ICD-10-CM | POA: Diagnosis not present

## 2019-05-03 DIAGNOSIS — Z23 Encounter for immunization: Secondary | ICD-10-CM | POA: Diagnosis not present

## 2019-05-03 DIAGNOSIS — D3910 Neoplasm of uncertain behavior of unspecified ovary: Secondary | ICD-10-CM | POA: Diagnosis not present

## 2019-05-03 DIAGNOSIS — Z5181 Encounter for therapeutic drug level monitoring: Secondary | ICD-10-CM

## 2019-05-03 DIAGNOSIS — Z66 Do not resuscitate: Secondary | ICD-10-CM | POA: Diagnosis not present

## 2019-05-03 DIAGNOSIS — Z9221 Personal history of antineoplastic chemotherapy: Secondary | ICD-10-CM | POA: Diagnosis not present

## 2019-05-03 DIAGNOSIS — I25118 Atherosclerotic heart disease of native coronary artery with other forms of angina pectoris: Secondary | ICD-10-CM

## 2019-05-03 LAB — POCT INR: INR: 2.9 (ref 2.0–3.0)

## 2019-05-03 NOTE — Assessment & Plan Note (Signed)
We have discussions about goals of care The patient is comfortable with the decision not to pursue further treatment She would like to continue on aggressive cardiac management including continuing treatment with anticoagulation therapy I recommend referral to palliative care team and she agreed We discussed advanced directives and living will The patient has advanced directives at home She is in agreement with DNR I encouraged her to discuss her decision with her family

## 2019-05-03 NOTE — Telephone Encounter (Signed)
Referral called to [facility] for [facility]. They will call patient tomorrow.

## 2019-05-03 NOTE — Progress Notes (Signed)
[facility] OFFICE PROGRESS NOTE  Patient Care Team: [name], [name], [name], [name], [name], MD as Consulting Physician (Cardiology)  ASSESSMENT & PLAN:  Granulosa cell tumor of ovary We have another long discussion about the risk and benefits of treatment The patient has made informed decision not to pursue palliative chemotherapy She tolerated letrozole poorly She progressed on tamoxifen She is concerned about side effects of Megace We discussed potential treatment with Lupron injection, but I am concerned about tolerability.  If she cannot tolerate letrozole, I doubt she can tolerate Lupron We also discussed the risk and benefits of bevacizumab I am concerned about risk of thrombosis Ultimately, I recommend consideration to stop treatment and focus on quality of life She is in agreement to this  Goals of care, counseling/discussion We have discussions about goals of care The patient is comfortable with the decision not to pursue further treatment She would like to continue on aggressive cardiac management including continuing treatment with anticoagulation therapy I recommend referral to palliative care team and she agreed We discussed advanced directives and living will The patient has advanced directives at home She is in agreement with DNR I encouraged her to discuss her decision with her family   No orders of the defined types were placed in this encounter.   All questions were answered. The patient knows to call the clinic with any problems, questions or concerns. The total time spent in the appointment was 30 minutes encounter with patients including review of chart and various tests results, discussions about plan of care and coordination of care  plan   Heath Lark, MD 05/03/2019 12:55 PM  INTERVAL HISTORY: Please see below for problem oriented charting. She returns for further discussion about plan of care She denies any abdominal symptoms No recent exacerbation of congestive heart failure She has expressed concerns about taking Megace due to potential interaction with her cardiac medications She has made informed decision before she show up that she does not want chemotherapy Since her last visit, she had a chance to talk to her family Ultimately, she felt that she needs to make medical decision for herself She is doing well and feeling well otherwise SUMMARY OF ONCOLOGIC HISTORY: Oncology History  Granulosa cell tumor of ovary  02/19/1998 Initial Diagnosis   The granulosa cell tumor was initially diagnosed at [facility] in 2000, with recurrences in 2004, 2007 and spring 2011. The original operative note from 2000 and that pathology did not transfer into present EMR and I do not know laterality of the original tumor. The surgery in 2004 included resection of the tumor with small bowel resection and colectomy for diverticulosis. She had laparoscopic repair of ventral hernia in 2005. In 2007 she had resection of pelvic recurrence of tumor plus open repair of ventral hernia with mesh. Most recent intervention was laparoscopic resection of suprapubic involvement by Dr.Hoxworth in May 2011; she had a small and asymptomatic suprapubic hernia in 2011 which has been followed. All of her disease has been very indolent and has generally been detected by CT scans. Last CT AP was April 2013; [name], self-limited episode of bowel obstruction in May 2012.   08/24/2002 Pathology Results   SOFT TISSUE MASS, PREVESICAL PELVIC  AREA, [name], there are tumor cells present which appear  histologically similar to those noted in the patient's previous granulosa cell tumor [id number] and therefore, consistent with recurrent/metastatic granulosa cell tumor. The findings correlate with the cytology material   12/06/2002 Pathology Results   1. SMALL INTESTINE, [name], SIGMOID, SEGMENTAL RESECTION: DIVERTICULOSIS WITH FOCAL MILD CHRONIC DIVERTICULITIS. SIX BENIGN LYMPH NODES. NO TUMOR IDENTIFIED.   05/12/2005 PET scan   Again identified are enlarged lymph nodes within the right posterior cervical region and left external iliac region, [name], especially when compared with the examination dated 10/30/04.   10/01/2005 Pathology Results   1. ABDOMINAL WALL MASS: METASTATIC GRANULOSA CELL TUMOR.  2. LEFT ILIAC MASS: METASTATIC GRANULOSA CELL TUMOR.  COMMENT Both specimens are involved by metastatic tumor with features consistent with metastasis from the previously diagnosed adult granulosa cell tumor.    06/11/2007 Imaging   Stable CT the pelvis - no evidence of metastatic disease.   06/09/2008 Imaging   1.  Interval enlargement of small left perivesical nodule measuring 18 mm maximally.  This may reflect a lymph node but has a nonspecific appearance.  Attention to this area followup is recommended. 2.  Otherwise stable postsurgical appearance of the pelvis.   12/14/2008 Imaging   1.  Mild increase in size of low attenuation nodule in the left anterior pelvis indenting the urinary bladder, which now measures 2.8 cm compared with 1.8 cm on prior study.  Metastatic disease cannot be excluded. 2.  No other significant changes identified.   Stable diverticulosis and small suprapubic anterior abdominal wall hernia containing a loop of small bowel.   05/23/2009 Pathology Results   1. SOFT TISSUE MASS, SIMPLE EXCISION, : INVOLVEMENT BY NEOPLASM CONSISTENT WITH METASTATIC GRANULOSA CELL TUMOR.   06/08/2010 Imaging   1.  No acute findings. 2.  Interval progression of mild diffuse left renal atrophy and hypoperfusion, consistent with left renal artery stenosis. 3.  Stable small hiatal hernia, tiny periumbilical hernia, and small suprapubic hernia containing a loop of small bowel.  No evidence of bowel ischemia or obstruction. 4.  Diverticulosis.  No radiographic evidence of diverticulitis.   05/22/2011 Imaging   1.  Stable exam.  No new or progressive disease identified within the abdomen or pelvis. 2.  Stable small hiatal hernia and suprapubic ventral hernia. Stable left renal atrophy, likely secondary to renal artery stenosis.   11/28/2011 Imaging   1.  No findings to suggest recurrent or metastatic disease in the abdomen or pelvis on today's examination. 2.  Indeterminate liver lesion in the right lobe of the liver is unchanged compared to numerous remote prior examinations. 3. Small suprapubic ventral hernia again noted. 4.  Extensive atherosclerosis including at least three vessel coronary artery disease.  5.  Postoperative changes, as above.    12/01/2016 Tumor Marker   Patient's tumor was tested for the following markers: Inhibin B Results of the tumor marker test revealed 119.2   12/11/2016 Imaging   Multiple new soft tissue masses in left pelvis and central small bowel mesentery, consistent with recurrent/metastatic carcinoma.    03/25/2017  Imaging   1. Omental and mesenteric tumor implants throughout the abdomen and pelvis with mixed findings when compared the prior study. Some of these lesions are clearly smaller and others are clearly larger. No new lesions are identified. 2. Stable severe atherosclerotic  calcifications. 3. Stable low anterior abdominal wall hernia. 4. Stable low-attenuation lesion involving the right hepatic lobe with adjacent calcifications or surgical clips.   10/12/2017 Imaging   Extraperitoneal fluid/hemorrhage related to the patient's dominant left pelvic tumor, as described above.  Progression of the patient's metastatic ovarian cancer, as described above. Dominant cystic/solid mass in the left adnexa/lower pelvis has increased in size. Peritoneal disease/omental caking is mildly progressive. New pulmonary nodule at the right lung base, indeterminate.    10/12/2017 - 10/17/2017 Hospital Admission   She was admitted for retroperitoneal bleed.   10/16/2017 Imaging   1. Improving peritoneal hemorrhage. 2. No significant change in cystic and solid left pelvic mass and smaller anterior peritoneal cystic and solid mass compatible with tumor implant. 3. No evidence of metastatic disease elsewhere in the abdomen or pelvis. 4. Cardiomegaly. 5.  Calcific coronary artery and aortic atherosclerosis.  Aortic Atherosclerosis (ICD10-I70.0).   10/27/2017 Tumor Marker   Patient's tumor was tested for the following markers: Inhibin B Results of the tumor marker test revealed 98.4   10/28/2017 Procedure   Status post right IJ port catheter placement.   10/29/2017 - 03/30/2018 Chemotherapy   The patient had carboplatin x 7 doses   11/24/2017 Tumor Marker   Patient's tumor was tested for the following markers: Inhibin B Results of the tumor marker test revealed 113.6   01/04/2018 Imaging   1. The left adnexal mass appears mildly decreased in size from previous exam. Associated hemorrhage with left lower abdominal hematomas have resolved in the interval. Low-density central mesenteric lesion is also slightly decreased in size in the interval. The solid and cystic lesion along the undersurface of the ventral abdominal wall is mildly increased in size in the interval. Other peritoneal  nodules are either stable or decreased in the interval. 2. Right lower lobe pulmonary nodule is unchanged. 3. No new sites of disease identified. 4. Aortic Atherosclerosis (ICD10-I70.0).   03/08/2018 Tumor Marker   Patient's tumor was tested for the following markers: Inhibin B Results of the tumor marker test revealed 135.5   04/01/2018 Tumor Marker   Patient's tumor was tested for the following markers: Inhibin B Results of the tumor marker test revealed 168.9   04/22/2018 Imaging   1. Multiple intraperitoneal implants, overall similar in size, number and distribution to prior study from 01/04/2018. Likewise, the right lower lobe pulmonary nodules also stable. No definite progression of disease on today's examination. 2. Cardiomegaly with left ventricular and left atrial dilatation. 3. Colonic diverticulosis without evidence of acute diverticulitis at this time.   04/22/2018 Tumor Marker   Patient's tumor was tested for the following markers: Inhibin B Results of the tumor marker test revealed 180   Ovarian cancer ([place])  10/19/2017 Initial Diagnosis   Ovarian cancer ([place])     REVIEW OF SYSTEMS:   Constitutional: Denies fevers, chills or abnormal weight loss Eyes: Denies blurriness of vision Ears, nose, mouth, throat, and face: Denies mucositis or sore throat Respiratory: Denies cough, dyspnea or wheezes Cardiovascular: Denies palpitation, chest discomfort or lower extremity swelling Gastrointestinal:  Denies nausea, heartburn or change in bowel habits Skin: Denies abnormal skin rashes Lymphatics: Denies new lymphadenopathy or easy bruising Neurological:Denies numbness, tingling or new weaknesses Behavioral/Psych: Mood is  stable, no new changes  All other systems were reviewed with the patient and are negative.  I have reviewed the past medical history, past surgical history, social history and family history with the patient and they are unchanged from previous  note.  ALLERGIES:  is allergic to bee venom; other; oxycodone hcl; amiodarone; and prednisone.  MEDICATIONS:  Current Outpatient Medications  Medication Sig Dispense Refill  . acetaminophen (TYLENOL) 500 MG tablet Take 500 mg by mouth every 6 (six) hours as needed for mild pain or headache.     Marland Kitchen atorvastatin (LIPITOR) 80 MG tablet TAKE 1 TABLET BY MOUTH EVERY DAY IN THE MORNING 90 tablet 3  . beta carotene w/minerals (OCUVITE) tablet Take 1 tablet by mouth 2 (two) times daily.     . Calcium Carb-Cholecalciferol (CALTRATE 600+D) 600-800 MG-UNIT TABS Take 1 tablet by mouth every evening.     . clopidogrel (PLAVIX) 75 MG tablet TAKE 1 TABLET BY MOUTH EVERY DAY 30 tablet 11  . Coenzyme Q-10 100 MG capsule Take 200 mg by mouth at bedtime.     . docusate sodium (COLACE) 100 MG capsule Take 100 mg by mouth daily as needed for mild constipation (IF NO B/M AFTER 2 DAYS).     Marland Kitchen dofetilide (TIKOSYN) 125 MCG capsule Take 1 capsule (125 mcg total) by mouth 2 (two) times daily. 60 capsule 6  . furosemide (LASIX) 40 MG tablet Take 2 tablets by mouth every morning and 2 additional tabs as needed for wt gain/abdominal distension 180 tablet 2  . gabapentin (NEURONTIN) 300 MG capsule TAKE 1 CAPSULE BY MOUTH TWICE A DAY 60 capsule 1  . isosorbide mononitrate (IMDUR) 60 MG 24 hr tablet TAKE 1 TABLET BY MOUTH EVERY DAY 30 tablet 11  . lidocaine-prilocaine (EMLA) cream Apply to PAC site 1-2 hours prior to access 30 g 1  . losartan (COZAAR) 50 MG tablet Take 1 tablet (50 mg total) by mouth daily. 90 tablet 3  . losartan (COZAAR) 50 MG tablet Take 1 tablet (50 mg total) by mouth daily. 30 tablet 3  . metoprolol (TOPROL-XL) 200 MG 24 hr tablet Take 1 tablet (200 mg total) by mouth daily. Take with or immediately following a meal. 30 tablet 6  . Multiple Vitamin (MULTIVITAMIN) capsule Take 1 capsule by mouth daily.    . nitroGLYCERIN (NITROSTAT) 0.4 MG SL tablet PLACE 1 TABLET (0.4 MG TOTAL) UNDER THE TONGUE EVERY 5  (FIVE) MINUTES AS NEEDED FOR CHEST PAIN 75 tablet 2  . Omega-3 Fatty Acids (FISH OIL) 1200 MG CAPS Take 1,200 mg by mouth daily.     . potassium chloride SA (KLOR-CON) 20 MEQ tablet Take 1 tablet (20 mEq total) by mouth 2 (two) times daily. 180 tablet 3  . warfarin (COUMADIN) 2 MG tablet TAKE AS DIRECTED BY COUMADIN CLINIC 30 tablet 3   No current facility-administered medications for this visit.    PHYSICAL EXAMINATION: ECOG PERFORMANCE STATUS: 1 - Symptomatic but completely ambulatory  Vitals:   05/03/19 1147  BP: (!) 157/49  Pulse: 75  Resp: 18  Temp: 98 F (36.7 C)  SpO2: 95%   Filed Weights   05/03/19 1147  Weight: 176 lb 3.2 oz (79.9 kg)    GENERAL:alert, no distress and comfortable Musculoskeletal:no cyanosis of digits and no clubbing  NEURO: alert & oriented x 3 with fluent speech, no focal motor/sensory deficits  LABORATORY DATA:  I have reviewed the data as listed    Component Value Date/Time   NA  144 03/22/2019 1502   NA 142 12/14/2014 1324   K 4.1 03/22/2019 1502   K 4.2 12/14/2014 1324   CL 102 03/22/2019 1502   CL 105 05/26/2012 1111   CO2 27 03/22/2019 1502   CO2 30 (H) 12/14/2014 1324   GLUCOSE 95 03/22/2019 1502   GLUCOSE 80 12/09/2018 1322   GLUCOSE 130 12/14/2014 1324   GLUCOSE 89 05/26/2012 1111   BUN 20 03/22/2019 1502   BUN 19.6 12/01/2016 1116   CREATININE 1.08 (H) 03/22/2019 1502   CREATININE 0.86 12/17/2017 0828   CREATININE 0.9 12/01/2016 1116   CALCIUM 8.9 03/22/2019 1502   CALCIUM 9.1 12/14/2014 1324   PROT 6.7 10/09/2018 0654   PROT 6.5 09/14/2018 1442   PROT 6.5 12/14/2014 1324   ALBUMIN 3.0 (L) 10/09/2018 0654   ALBUMIN 3.9 09/14/2018 1442   ALBUMIN 3.4 (L) 12/14/2014 1324   AST 27 10/09/2018 0654   AST 25 12/17/2017 0828   AST 29 12/14/2014 1324   ALT 30 10/09/2018 0654   ALT 26 12/17/2017 0828   ALT 20 12/14/2014 1324   ALKPHOS 86 10/09/2018 0654   ALKPHOS 103 12/14/2014 1324   BILITOT 1.3 (H) 10/09/2018 0654    BILITOT 0.7 09/14/2018 1442   BILITOT 0.5 12/17/2017 0828   BILITOT 0.77 12/14/2014 1324   GFRNONAA 47 (L) 03/22/2019 1502   GFRNONAA 60 (L) 12/17/2017 0828   GFRAA 54 (L) 03/22/2019 1502   GFRAA >60 12/17/2017 0828    No results found for: SPEP, UPEP  Lab Results  Component Value Date   WBC 5.4 03/08/2019   NEUTROABS 4.0 11/09/2018   HGB 11.9 03/08/2019   HCT 35.1 03/08/2019   MCV 98 (H) 03/08/2019   PLT 180 03/08/2019      Chemistry      Component Value Date/Time   NA 144 03/22/2019 1502   NA 142 12/14/2014 1324   K 4.1 03/22/2019 1502   K 4.2 12/14/2014 1324   CL 102 03/22/2019 1502   CL 105 05/26/2012 1111   CO2 27 03/22/2019 1502   CO2 30 (H) 12/14/2014 1324   BUN 20 03/22/2019 1502   BUN 19.6 12/01/2016 1116   CREATININE 1.08 (H) 03/22/2019 1502   CREATININE 0.86 12/17/2017 0828   CREATININE 0.9 12/01/2016 1116      Component Value Date/Time   CALCIUM 8.9 03/22/2019 1502   CALCIUM 9.1 12/14/2014 1324   ALKPHOS 86 10/09/2018 0654   ALKPHOS 103 12/14/2014 1324   AST 27 10/09/2018 0654   AST 25 12/17/2017 0828   AST 29 12/14/2014 1324   ALT 30 10/09/2018 0654   ALT 26 12/17/2017 0828   ALT 20 12/14/2014 1324   BILITOT 1.3 (H) 10/09/2018 0654   BILITOT 0.7 09/14/2018 1442   BILITOT 0.5 12/17/2017 0828   BILITOT 0.77 12/14/2014 1324       RADIOGRAPHIC STUDIES: I have personally reviewed the radiological images as listed and agreed with the findings in the report. CT Abdomen Pelvis W Contrast  Result Date: 04/14/2019 CLINICAL DATA:  Restaging ovarian cancer EXAM: CT ABDOMEN AND PELVIS WITH CONTRAST TECHNIQUE: Multidetector CT imaging of the abdomen and pelvis was performed using the standard protocol following bolus administration of intravenous contrast. CONTRAST:  128mL OMNIPAQUE IOHEXOL 300 MG/ML  SOLN COMPARISON:  12/09/2018 FINDINGS: Lower chest: There is a small right pleural effusion which is new from previous exam. Anterior right lung base nodule  which extends across the fissure into the right middle lobe measures 2.2  cm, image 15/4. Previously 1.2 cm. Hepatobiliary: Low-attenuation and calcification along the posterior margin of the right hemi liver is unchanged measuring 2.2 cm, image 24/2. Previous cholecystectomy. Mild fusiform dilatation of the CBD measures up to 1.1 cm. No choledocholithiasis. Pancreas: Unremarkable. No pancreatic ductal dilatation or surrounding inflammatory changes. Spleen: Normal in size without focal abnormality. Adrenals/Urinary Tract: Normal appearance of the adrenal glands. There is asymmetric atrophy of the left kidney, unchanged. Scarring involving the right kidney noted. No mass or hydronephrosis identified bilaterally. The urinary bladder is unremarkable. Stomach/Bowel: Small hiatal hernia. The small bowel loops are nondilated. No bowel wall thickening, inflammation or distension. The appendix is visualized and appears normal. Vascular/Lymphatic: Aortic atherosclerosis. No aneurysm. No retroperitoneal or mesenteric adenopathy identified. No pelvic or inguinal adenopathy identified. Reproductive: Status post hysterectomy. Other: Soft tissue mass within the left iliac fossa measures 6.4 x 3.8 cm, image 66/2. Previously 5.5 x 3.9 cm. Soft tissue implant within the ventral abdomen measures 2.7 x 1.1 cm, image 53/2. Previously 2.3 by 0.9 cm. Within the lower pelvis there is a lesion measuring 1.8 x 1.5 cm, image 71/2. Unchanged from previous exam. No ascites. Marked laxity of the ventral abdominal wall is identified with evidence of previous herniorrhaphy. Small hernia within the lower portions of the ventral abdominal wall is again noted containing a nonobstructed loop of small bowel, image 63/6. Musculoskeletal: No acute or significant osseous findings. Anterolisthesis of L5 on S1 noted. There is degenerative disc disease at L5-S1 and L2-3. IMPRESSION: 1. Interval progression of disease. There has been interval increase in size  of left iliac fossa mass. Additionally, there is a soft tissue implant within the ventral abdomen which has increased in size in the interval. The previously noted right lower lobe pulmonary nodule has also increased in size. 2. New small right pleural effusion. Aortic Atherosclerosis (ICD10-I70.0). Electronically Signed   By: Kerby Moors M.D.   On: 04/14/2019 16:41

## 2019-05-03 NOTE — Telephone Encounter (Signed)
-----   Message from Heath Lark, MD sent at 05/03/2019 12:58 PM EDT ----- Regarding: pls send referral for palliative care

## 2019-05-03 NOTE — Assessment & Plan Note (Signed)
We have another long discussion about the risk and benefits of treatment The patient has made informed decision not to pursue palliative chemotherapy She tolerated letrozole poorly She progressed on tamoxifen She is concerned about side effects of Megace We discussed potential treatment with Lupron injection, but I am concerned about tolerability.  If she cannot tolerate letrozole, I doubt she can tolerate Lupron We also discussed the risk and benefits of bevacizumab I am concerned about risk of thrombosis Ultimately, I recommend consideration to stop treatment and focus on quality of life She is in agreement to this

## 2019-05-03 NOTE — Patient Instructions (Signed)
Conntinue taking 1 tablet daily except 1/2 tablet on Mondays, Wednesdays and Fridays. Recheck INR in 6 weeks.  Main [phone]. [facility]in [facility] # [phone]

## 2019-05-09 ENCOUNTER — Ambulatory Visit (INDEPENDENT_AMBULATORY_CARE_PROVIDER_SITE_OTHER): Payer: Medicare Other | Admitting: *Deleted

## 2019-05-09 DIAGNOSIS — I495 Sick sinus syndrome: Secondary | ICD-10-CM

## 2019-05-09 LAB — CUP PACEART REMOTE DEVICE CHECK
Battery Remaining Longevity: 122 mo
Battery Remaining Percentage: 95.5 %
Battery Voltage: 3.01 V
Brady Statistic AP VP Percent: 1 %
Brady Statistic AP VS Percent: 92 %
Brady Statistic AS VP Percent: 1 %
Brady Statistic AS VS Percent: 6.8 %
Brady Statistic RA Percent Paced: 93 %
Brady Statistic RV Percent Paced: 1 %
Date Time Interrogation Session: 20210405054834
Implantable Lead Implant Date: 20181224
Implantable Lead Implant Date: 20181224
Implantable Lead Location: 753859
Implantable Lead Location: 753860
Implantable Pulse Generator Implant Date: 20181224
Lead Channel Impedance Value: 490 Ohm
Lead Channel Impedance Value: 810 Ohm
Lead Channel Pacing Threshold Amplitude: 0.5 V
Lead Channel Pacing Threshold Amplitude: 0.75 V
Lead Channel Pacing Threshold Pulse Width: 0.4 ms
Lead Channel Pacing Threshold Pulse Width: 0.4 ms
Lead Channel Sensing Intrinsic Amplitude: 5 mV
Lead Channel Sensing Intrinsic Amplitude: 5.2 mV
Lead Channel Setting Pacing Amplitude: 2.5 V
Lead Channel Setting Pacing Amplitude: 2.5 V
Lead Channel Setting Pacing Pulse Width: 0.4 ms
Lead Channel Setting Sensing Sensitivity: 2 mV
Pulse Gen Model: 2272
Pulse Gen Serial Number: 8968200

## 2019-05-10 NOTE — Progress Notes (Signed)
PPM Remote  

## 2019-05-11 ENCOUNTER — Telehealth: Payer: Self-pay

## 2019-05-11 NOTE — Telephone Encounter (Signed)
Pt called wanting help on sending a transmission from her home monitor since she has missed a few. I have walked her through the process and assured her that the nurses will look over the transmission and call her if they see anything alarming. Pt verbalized understanding

## 2019-05-24 DIAGNOSIS — H31011 Macula scars of posterior pole (postinflammatory) (post-traumatic), right eye: Secondary | ICD-10-CM | POA: Diagnosis not present

## 2019-05-24 DIAGNOSIS — H353132 Nonexudative age-related macular degeneration, bilateral, intermediate dry stage: Secondary | ICD-10-CM | POA: Diagnosis not present

## 2019-05-24 DIAGNOSIS — Z961 Presence of intraocular lens: Secondary | ICD-10-CM | POA: Diagnosis not present

## 2019-05-24 DIAGNOSIS — H35363 Drusen (degenerative) of macula, bilateral: Secondary | ICD-10-CM | POA: Diagnosis not present

## 2019-05-26 ENCOUNTER — Ambulatory Visit (HOSPITAL_BASED_OUTPATIENT_CLINIC_OR_DEPARTMENT_OTHER)
Admission: RE | Admit: 2019-05-26 | Discharge: 2019-05-26 | Disposition: A | Discharge status: Home / Self Care | Payer: Medicare Other | Source: Ambulatory Visit | Attending: Nurse Practitioner | Admitting: Nurse Practitioner

## 2019-05-26 ENCOUNTER — Encounter (HOSPITAL_COMMUNITY): Payer: Self-pay | Admitting: Nurse Practitioner

## 2019-05-26 ENCOUNTER — Other Ambulatory Visit: Payer: Self-pay

## 2019-05-26 VITALS — BP 122/46 | HR 74 | Ht 60.0 in | Wt 174.8 lb

## 2019-05-26 DIAGNOSIS — I5043 Acute on chronic combined systolic (congestive) and diastolic (congestive) heart failure: Secondary | ICD-10-CM | POA: Diagnosis not present

## 2019-05-26 DIAGNOSIS — Z8543 Personal history of malignant neoplasm of ovary: Secondary | ICD-10-CM | POA: Diagnosis not present

## 2019-05-26 DIAGNOSIS — Z7901 Long term (current) use of anticoagulants: Secondary | ICD-10-CM | POA: Insufficient documentation

## 2019-05-26 DIAGNOSIS — Z20822 Contact with and (suspected) exposure to covid-19: Secondary | ICD-10-CM | POA: Diagnosis not present

## 2019-05-26 DIAGNOSIS — N183 Chronic kidney disease, stage 3 unspecified: Secondary | ICD-10-CM | POA: Insufficient documentation

## 2019-05-26 DIAGNOSIS — I5040 Unspecified combined systolic (congestive) and diastolic (congestive) heart failure: Secondary | ICD-10-CM | POA: Insufficient documentation

## 2019-05-26 DIAGNOSIS — C569 Malignant neoplasm of unspecified ovary: Secondary | ICD-10-CM | POA: Insufficient documentation

## 2019-05-26 DIAGNOSIS — Z87891 Personal history of nicotine dependence: Secondary | ICD-10-CM | POA: Insufficient documentation

## 2019-05-26 DIAGNOSIS — Z79899 Other long term (current) drug therapy: Secondary | ICD-10-CM | POA: Insufficient documentation

## 2019-05-26 DIAGNOSIS — I447 Left bundle-branch block, unspecified: Secondary | ICD-10-CM | POA: Diagnosis not present

## 2019-05-26 DIAGNOSIS — I130 Hypertensive heart and chronic kidney disease with heart failure and stage 1 through stage 4 chronic kidney disease, or unspecified chronic kidney disease: Secondary | ICD-10-CM | POA: Insufficient documentation

## 2019-05-26 DIAGNOSIS — R0789 Other chest pain: Secondary | ICD-10-CM | POA: Diagnosis not present

## 2019-05-26 DIAGNOSIS — D6869 Other thrombophilia: Secondary | ICD-10-CM | POA: Diagnosis not present

## 2019-05-26 DIAGNOSIS — R079 Chest pain, unspecified: Secondary | ICD-10-CM | POA: Diagnosis not present

## 2019-05-26 DIAGNOSIS — I4892 Unspecified atrial flutter: Secondary | ICD-10-CM

## 2019-05-26 DIAGNOSIS — G4733 Obstructive sleep apnea (adult) (pediatric): Secondary | ICD-10-CM | POA: Insufficient documentation

## 2019-05-26 DIAGNOSIS — I2510 Atherosclerotic heart disease of native coronary artery without angina pectoris: Secondary | ICD-10-CM | POA: Insufficient documentation

## 2019-05-26 DIAGNOSIS — E785 Hyperlipidemia, unspecified: Secondary | ICD-10-CM | POA: Insufficient documentation

## 2019-05-26 DIAGNOSIS — Z7902 Long term (current) use of antithrombotics/antiplatelets: Secondary | ICD-10-CM | POA: Insufficient documentation

## 2019-05-26 DIAGNOSIS — E873 Alkalosis: Secondary | ICD-10-CM | POA: Diagnosis not present

## 2019-05-26 DIAGNOSIS — I200 Unstable angina: Secondary | ICD-10-CM | POA: Diagnosis not present

## 2019-05-26 DIAGNOSIS — Z66 Do not resuscitate: Secondary | ICD-10-CM | POA: Diagnosis not present

## 2019-05-26 DIAGNOSIS — I252 Old myocardial infarction: Secondary | ICD-10-CM | POA: Insufficient documentation

## 2019-05-26 DIAGNOSIS — I4821 Permanent atrial fibrillation: Secondary | ICD-10-CM | POA: Diagnosis not present

## 2019-05-26 LAB — BASIC METABOLIC PANEL
Anion gap: 11 (ref 5–15)
BUN: 26 mg/dL — ABNORMAL HIGH (ref 8–23)
CO2: 29 mmol/L (ref 22–32)
Calcium: 8.9 mg/dL (ref 8.9–10.3)
Chloride: 102 mmol/L (ref 98–111)
Creatinine, Ser: 0.99 mg/dL (ref 0.44–1.00)
GFR calc Af Amer: 60 mL/min — ABNORMAL LOW (ref >60)
GFR calc non Af Amer: 52 mL/min — ABNORMAL LOW (ref >60)
Glucose, Bld: 106 mg/dL — ABNORMAL HIGH (ref 70–99)
Potassium: 3.9 mmol/L (ref 3.5–5.1)
Sodium: 142 mmol/L (ref 135–145)

## 2019-05-26 LAB — MAGNESIUM: Magnesium: 2 mg/dL (ref 1.7–2.4)

## 2019-05-26 MED ORDER — NITROGLYCERIN 0.4 MG SL SUBL: SUBLINGUAL_TABLET | SUBLINGUAL | 2 refills | Status: DC

## 2019-05-26 NOTE — Progress Notes (Signed)
Primary Care Physician: [name], MD Referring Physician: Dr. Aurea Graff Patty [name] is a 86 y.o. female with a h/o permanent afib, CAD, HTN, HLD, LBBB, tachy-brady with w/ PPM, OSA, that presented to [facility] with afib with RVR, and HF symptoms.  She was diuresed, LHC with stable disease, NSTEMI thought to be 2/2 demand ischemia forn RVR.CCB was stopped and she was transitioned to BB for worsening EF to 40-45%. Her RVR persisted despite aggressive rate control and it was decided to start tikosyn. DCCV restored SR, but had ERAF/atial tach.   In the afib clinic today she feels improved. Fluid status stable, ekg appears to be Sinus rhythm( a paced). Taking tikosyn appropriately. Continues on warfarin with a CHA2DS2VASc score of  at least 6. She hopes to spend Thanksgiving to Christmas with her daughter/son-in-law in the [place] area.   F/u in afib clinic, 10/29.  She called as she started having some chest discomfort and felt the need to take a NTG x 4 in the last 48 hours. Report sent to device clinic yesterday showed atrial flutter. She is not having chest pain today. Discussed with Dr. [name] and he suggested to try to pace terminate. Aaron Edelman with [employer] was asked to come to clinic to  do this.. It was not successful. He felt the pt was in an atrial tachycardia at atrial rate of 200 bpm. He discussed with Dr. [name] and it was decided to mode change from DDD to DDI 2/2 atrial undersensing which ultimately lead to ventricular pacing at 120 bpm( this is thought to have triggered her chest discomfort as replicated in the office today). Her lower rate was reduced to 40 BPM, which her intrinsic presently is 90 bpm.   F/u clinic 12/06/18. She is interrogated today and remains in atrial tach with v rate in the nineties. She  does not seem to be having as high of v rates as last week with 85% of time v rate in the 90's. Atrial rate around 200 bpm. She still reports some chest pressure in the am. She will  rest and take a NTG s/l and will feel a need to take another one around bedtime. She continues with her busy daytime routine in between.  I can not correlate  if chest pressure is  with higher v rates.   F/u in afib clinic, 02/15/19. She just got back from spending 6 weeks with her daughter and son in law in [place] over the holidays. She had a very good visit. She  only had to take 2 NTG while there. She has  not had any heart racing in a while.  EKG today show a paced rhythm. Continues on warfarin with a CHA2DS2VASc score of at least 6.   F/u in afib clinic, 05/26/19. She is in atrial flutter today. She is unaware . Paceart from early April shows 23 episodes of atrial arrhythmia's, 7% burden. She  informs me that oncologist Dr. Alvy Bimler  found  progression of her chronic  granulosa  disease now involving ovary. She  was afraid that she would not be able to tolerate more aggressive treatment for this. Pt  was taken off tamoxifen, and is to  focus on quality of life. She  was referred to palliative care.Pt is comfortable in this approach. She  is happy to have the support of palliative care.   Today, she denies symptoms of palpitations, chest pain, shortness of breath, orthopnea, PND, lower extremity edema, dizziness, presyncope, syncope,  or neurologic sequela. The patient is tolerating medications without difficulties and is otherwise without complaint today.   Past Medical History:  Diagnosis Date  . Bleeding behind the abdominal cavity 10/2017  . Chronic anticoagulation - coumadin, CHADS2VASC=6 05/17/2015  . CKD (chronic kidney disease), stage III   . Combined systolic and diastolic heart failure ([place])   . Coronary artery disease    a. s/p multiple stents - stenting x 2 to the LAD and x 1 to the LCx in 2000, rotational arthrectomy to proximal LCx and DES to LAD in 2014 and DES to LCx in 2014, along with DES to LAD for re-in-stent stenosis in 2015, and DES to ostial LAD in 2016. b. 07/2016 - orbital  atherectomy & DES to mid LCx.  . Diverticulitis   . Granulosa cell carcinoma ([place])    abd; last episode was in 2009  . Hyperlipidemia   . Hypertension   . LBBB (left bundle branch block)   . Myocardial infarction ([place]) 2002  . Obesity   . OSA on CPAP   . Ovarian ca ([name]) 2019  . Pacemaker failure    a. Prior leadless PPM with premature battery failure, being managed conservatively without replacement.  . Peripheral vascular disease ([place])   . Permanent atrial fibrillation ([place]) 2013  . Renal artery stenosis ([place])   . Tachycardia-bradycardia syndrome ([place])    a. s/p leadless pacemaker ([name]) implanted by Dr [name]  . Varicose veins    Past Surgical History:  Procedure Laterality Date  . ABDOMINAL HYSTERECTOMY    . CARDIAC CATHETERIZATION  09/03/2007   EF 70%; Failed attempt at PCI to OM  . CARDIAC CATHETERIZATION  11/01/2003   EF 70%  . CARDIAC CATHETERIZATION N/A 12/14/2015   Procedure: Left Heart Cath and Coronary Angiography;  Surgeon: Burnell Blanks, MD;  Location: [place] CV LAB;  Service: Cardiovascular;  Laterality: N/A;  . CARDIOVERSION  12/31/2011   Procedure: CARDIOVERSION;  Surgeon: [name], MD;  Location: [facility] ENDOSCOPY;  Service: Cardiovascular;  Laterality: N/A;  . CARDIOVERSION N/A 12/31/2011   Procedure: CARDIOVERSION;  Surgeon: [name], MD;  Location: [facility] CATH LAB;  Service: Cardiovascular;  Laterality: N/A;  . CARDIOVERSION N/A 11/15/2018   Procedure: CARDIOVERSION;  Surgeon: Dorothy Spark, MD;  Location: [place];  Service: Cardiovascular;  Laterality: N/A;  . CATARACT EXTRACTION, BILATERAL  2015  . CHOLECYSTECTOMY  1980's  . COLON SURGERY  2004   colectomy for diverticulosis  . CORONARY ANGIOPLASTY WITH STENT PLACEMENT  2000    and 08/11/2012; 11/12/2012: 3 + 2 LAD & CFX; 2nd CFX stent 11/12/2012  . CORONARY ATHERECTOMY N/A 07/15/2016   Procedure: Coronary Atherectomy;  Surgeon: Martinique, Peter M, MD;  Location: [place] CV LAB;  Service: Cardiovascular;  Laterality: N/A;  . CORONARY BALLOON ANGIOPLASTY N/A 07/14/2016   Procedure: Coronary Balloon Angioplasty;  Surgeon: Martinique, Peter M, MD;  Location: [place] CV LAB;  Service: Cardiovascular;  Laterality: N/A;  . CORONARY STENT INTERVENTION N/A 07/15/2016   Procedure: Coronary Stent Intervention;  Surgeon: Martinique, Peter M, MD;  Location: [facility];  Service: Cardiovascular;  Laterality: N/A;  . FRACTIONAL FLOW RESERVE WIRE  10/07/2013   Procedure: [place];  Surgeon: [name], MD;  Location: [facility] CATH LAB;  Service: Cardiovascular;;  . HERNIA REPAIR  2005   "laparoscopic"  . IR IMAGING GUIDED PORT INSERTION  10/28/2017  . LEFT HEART CATH AND CORONARY ANGIOGRAPHY N/A 07/14/2016  Procedure: Left Heart Cath and Coronary Angiography;  Surgeon: Martinique, Peter M, MD;  Location: [place] CV LAB;  Service: Cardiovascular;  Laterality: N/A;  . LEFT HEART CATH AND CORONARY ANGIOGRAPHY N/A 11/10/2018   Procedure: LEFT HEART CATH AND CORONARY ANGIOGRAPHY;  Surgeon: Wellington Hampshire, MD;  Location: [place] CV LAB;  Service: Cardiovascular;  Laterality: N/A;  . LEFT HEART CATHETERIZATION WITH CORONARY ANGIOGRAM N/A 11/12/2012   Procedure: LEFT HEART CATHETERIZATION WITH CORONARY ANGIOGRAM;  Surgeon: [name], MD;  Location: [facility] CATH LAB;  Service: Cardiovascular;  Laterality: N/A;  . LEFT HEART CATHETERIZATION WITH CORONARY ANGIOGRAM N/A 10/07/2013   Procedure: LEFT HEART CATHETERIZATION WITH CORONARY ANGIOGRAM;  Surgeon: [name], MD;  Location: [facility] CATH LAB;  Service: Cardiovascular;  Laterality: N/A;  . LEFT HEART CATHETERIZATION WITH CORONARY ANGIOGRAM N/A 12/14/2013   Procedure: LEFT HEART CATHETERIZATION WITH CORONARY ANGIOGRAM;  Surgeon: Sinclair Grooms, MD;  Location: [facility] CATH LAB;  Service: Cardiovascular;  Laterality: N/A;  . LEFT HEART CATHETERIZATION WITH CORONARY ANGIOGRAM N/A 05/16/2014    Procedure: LEFT HEART CATHETERIZATION WITH CORONARY ANGIOGRAM;  Surgeon: Sherren Mocha, MD;  Location: [facility] CATH LAB;  Service: Cardiovascular;  Laterality: N/A;  . PERCUTANEOUS CORONARY INTERVENTION-BALLOON ONLY  08/04/2012   Procedure: PERCUTANEOUS CORONARY INTERVENTION-BALLOON ONLY;  Surgeon: [name], MD;  Location: [facility] CATH LAB;  Service: Cardiovascular;;  . PERCUTANEOUS CORONARY ROTOBLATOR INTERVENTION (PCI-R) N/A 08/11/2012   Procedure: PERCUTANEOUS CORONARY ROTOBLATOR INTERVENTION (PCI-R);  Surgeon: [name], MD;  Location: [facility] CATH LAB;  Service: Cardiovascular;  Laterality: N/A;  . PERCUTANEOUS CORONARY STENT INTERVENTION (PCI-S)  10/07/2013   Procedure: PERCUTANEOUS CORONARY STENT INTERVENTION (PCI-S);  Surgeon: [name], MD;  Location: [facility] CATH LAB;  Service: Cardiovascular;;  . PERMANENT PACEMAKER INSERTION N/A 03/16/2012   [name] (SJM) leadless pacemaker (LEADLESS II STUDY PATEINT)  . SALIVARY GLAND SURGERY  2000's   "had a little lump removed; granulosa related; it was benign" (08/11/2012)  . TEE WITHOUT CARDIOVERSION  12/31/2011   Procedure: TRANSESOPHAGEAL ECHOCARDIOGRAM (TEE);  Surgeon: [name], MD;  Location: [facility] ENDOSCOPY;  Service: Cardiovascular;  Laterality: N/A;  . UMBILICAL GRANULOMA EXCISION  2000   2003; 2004; 2007: "all in my abdomen including small intestines, outside my ?uterus/etc" (08/11/2012)  . VARICOSE VEIN SURGERY Bilateral 1977    Current Outpatient Medications  Medication Sig Dispense Refill  . acetaminophen (TYLENOL) 500 MG tablet Take 500 mg by mouth every 6 (six) hours as needed for mild pain or headache.     Marland Kitchen atorvastatin (LIPITOR) 80 MG tablet TAKE 1 TABLET BY MOUTH EVERY DAY IN THE MORNING 90 tablet 3  . beta carotene w/minerals (OCUVITE) tablet Take 1 tablet by mouth 2 (two) times daily.     . Calcium Carb-Cholecalciferol (CALTRATE 600+D) 600-800 MG-UNIT TABS Take 1 tablet by mouth every evening.     . clopidogrel  (PLAVIX) 75 MG tablet TAKE 1 TABLET BY MOUTH EVERY DAY 30 tablet 11  . Coenzyme Q-10 100 MG capsule Take 200 mg by mouth at bedtime.     . docusate sodium (COLACE) 100 MG capsule Take 100 mg by mouth daily as needed for mild constipation (IF NO B/M AFTER 2 DAYS).     Marland Kitchen dofetilide (TIKOSYN) 125 MCG capsule Take 1 capsule (125 mcg total) by mouth 2 (two) times daily. 60 capsule 6  . furosemide (LASIX) 40 MG tablet Take 2 tablets by mouth every morning and 2 additional tabs as needed for wt gain/abdominal  distension 180 tablet 2  . gabapentin (NEURONTIN) 300 MG capsule TAKE 1 CAPSULE BY MOUTH TWICE A DAY 60 capsule 1  . isosorbide mononitrate (IMDUR) 60 MG 24 hr tablet TAKE 1 TABLET BY MOUTH EVERY DAY 30 tablet 11  . lidocaine-prilocaine (EMLA) cream Apply to PAC site 1-2 hours prior to access 30 g 1  . losartan (COZAAR) 50 MG tablet Take 1 tablet (50 mg total) by mouth daily. 90 tablet 3  . losartan (COZAAR) 50 MG tablet Take 1 tablet (50 mg total) by mouth daily. 30 tablet 3  . metoprolol (TOPROL-XL) 200 MG 24 hr tablet Take 1 tablet (200 mg total) by mouth daily. Take with or immediately following a meal. 30 tablet 6  . Multiple Vitamin (MULTIVITAMIN) capsule Take 1 capsule by mouth daily.    . nitroGLYCERIN (NITROSTAT) 0.4 MG SL tablet PLACE 1 TABLET (0.4 MG TOTAL) UNDER THE TONGUE EVERY 5 (FIVE) MINUTES AS NEEDED FOR CHEST PAIN 75 tablet 2  . Omega-3 Fatty Acids (FISH OIL) 1200 MG CAPS Take 1,200 mg by mouth daily.     . potassium chloride SA (KLOR-CON) 20 MEQ tablet Take 1 tablet (20 mEq total) by mouth 2 (two) times daily. 180 tablet 3  . warfarin (COUMADIN) 2 MG tablet TAKE AS DIRECTED BY COUMADIN CLINIC 30 tablet 3   No current facility-administered medications for this encounter.    Allergies  Allergen Reactions  . Bee Venom Anaphylaxis  . Other Nausea And Vomiting and Other (See Comments)    Pain medications cause severe vomiting. Tolerated slow IV morphine drip  . Oxycodone Hcl  Nausea And Vomiting  . Amiodarone Nausea Only  . Prednisone Palpitations and Other (See Comments)    "Rapid Heart Beat"    Social History   Socioeconomic History  . Marital status: Widowed    Spouse name: Not on file  . Number of children: 4  . Years of education: Not on file  . Highest education level: Not on file  Occupational History  . Occupation: Retired Nurse, learning disability estate  Tobacco Use  . Smoking status: Former Smoker    Packs/day: 1.00    Years: 32.00    Pack years: 32.00    Types: Cigarettes    Quit date: 02/03/1974    Years since quitting: 45.3  . Smokeless tobacco: Never Used  Substance and Sexual Activity  . Alcohol use: Yes    Alcohol/week: 7.0 standard drinks    Types: 7 Glasses of wine per week    Comment: one glass wine nightly with dinner  . Drug use: No  . Sexual activity: Never  Other Topics Concern  . Not on file  Social History Narrative   Lives alone.   Social Determinants of Health   Financial Resource Strain:   . Difficulty of Paying Living Expenses:   Food Insecurity:   . Worried About Charity fundraiser in the Last Year:   . Arboriculturist in the Last Year:   Transportation Needs:   . Film/video editor (Medical):   Marland Kitchen Lack of Transportation (Non-Medical):   Physical Activity:   . Days of Exercise per Week:   . Minutes of Exercise per Session:   Stress:   . Feeling of Stress :   Social Connections:   . Frequency of Communication with Friends and Family:   . Frequency of Social Gatherings with Friends and Family:   . Attends Religious Services:   . Active Member of Clubs or  Organizations:   . Attends Archivist Meetings:   Marland Kitchen Marital Status:   Intimate Partner Violence:   . Fear of Current or Ex-Partner:   . Emotionally Abused:   Marland Kitchen Physically Abused:   . Sexually Abused:     Family History  Problem Relation Age of Onset  . Stroke Mother   . Heart attack Father   . Diabetes Father   . Hypertension Father     . Heart attack Brother   . Diabetes Brother   . Hypertension Brother   . Kidney failure Brother     ROS- All systems are reviewed and negative except as per the HPI above  Physical Exam: Vitals:   05/26/19 1448  BP: (!) 122/46  Pulse: 74  Weight: 79.3 kg  Height: 5' (1.524 m)   Wt Readings from Last 3 Encounters:  05/26/19 79.3 kg  05/03/19 79.9 kg  04/15/19 80.6 kg    Labs: Lab Results  Component Value Date   NA 144 03/22/2019   K 4.1 03/22/2019   CL 102 03/22/2019   CO2 27 03/22/2019   GLUCOSE 95 03/22/2019   BUN 20 03/22/2019   CREATININE 1.08 (H) 03/22/2019   CALCIUM 8.9 03/22/2019   PHOS 3.7 06/08/2010   MG 2.0 12/06/2018   Lab Results  Component Value Date   INR 2.9 05/03/2019   Lab Results  Component Value Date   CHOL 112 11/10/2018   HDL 36 (L) 11/10/2018   LDLCALC 62 11/10/2018   TRIG 69 11/10/2018     GEN- The patient is well appearing, alert and oriented x 3 today.   Head- normocephalic, atraumatic Eyes-  Sclera clear, conjunctiva pink Ears- hearing intact Oropharynx- clear Neck- supple, no JVP Lymph- no cervical lymphadenopathy Lungs- Clear to ausculation bilaterally, normal work of breathing Heart- irregular rate and rhythm, no murmurs, rubs or gallops, PMI not laterally displaced GI- soft, NT, ND, + BS Extremities- no clubbing, cyanosis, or edema MS- no significant deformity or atrophy Skin- no rash or lesion Psych- euthymic mood, full affect Neuro- strength and sensation are intact  EKG-  atrial flutter at 74 bpm, qrs int 154 ms, qtc 486 ms  Paceart 4/21-Scheduled remote reviewed. Normal device function. 23 AT/AF episodes since last remote. Longest lasting 19 min 38 sec with controlled VR. AF burden 6.2%. On Coumadin. Programmed DDIR.    Assessment and Plan: 1. Atach/atrial flutter  Out of rhythm today and is unaware  Feels well at this time  Continue dofetilide 125 mcg bid Continue metoprolol xl 200 mg a day  Bmet/mag today    2. CHF Continue daily weights, weight stable at home per pt  Avoid salt   3. CAD Complex disease  Stable  use of NTG recently  4. CHA2DS2VASc score of at least 6 Continue wafarin  5. Progressive granulosa  Per Dr. Alvy Bimler  Referred to palliative care   F/u  with Dr. [name] I August  F/u with Dr. Sherril Croon C. Noelle Sease, [facility] [address] [place], [place] [address] [phone]

## 2019-05-27 DIAGNOSIS — I4891 Unspecified atrial fibrillation: Secondary | ICD-10-CM | POA: Diagnosis not present

## 2019-05-27 DIAGNOSIS — R079 Chest pain, unspecified: Secondary | ICD-10-CM | POA: Diagnosis not present

## 2019-05-27 DIAGNOSIS — Z743 Need for continuous supervision: Secondary | ICD-10-CM | POA: Diagnosis not present

## 2019-05-27 DIAGNOSIS — I499 Cardiac arrhythmia, unspecified: Secondary | ICD-10-CM | POA: Diagnosis not present

## 2019-05-27 DIAGNOSIS — R0789 Other chest pain: Secondary | ICD-10-CM | POA: Diagnosis not present

## 2019-05-28 ENCOUNTER — Other Ambulatory Visit: Payer: Self-pay

## 2019-05-28 ENCOUNTER — Inpatient Hospital Stay (HOSPITAL_COMMUNITY)
Admission: EM | Admit: 2019-05-28 | Discharge: 2019-05-31 | DRG: 291 | Disposition: A | Discharge status: Hospice | Payer: Medicare Other | Attending: Family Medicine | Admitting: Family Medicine

## 2019-05-28 ENCOUNTER — Emergency Department (HOSPITAL_COMMUNITY): Payer: Medicare Other

## 2019-05-28 DIAGNOSIS — I2510 Atherosclerotic heart disease of native coronary artery without angina pectoris: Secondary | ICD-10-CM | POA: Diagnosis not present

## 2019-05-28 DIAGNOSIS — R079 Chest pain, unspecified: Secondary | ICD-10-CM

## 2019-05-28 DIAGNOSIS — Z885 Allergy status to narcotic agent status: Secondary | ICD-10-CM

## 2019-05-28 DIAGNOSIS — E785 Hyperlipidemia, unspecified: Secondary | ICD-10-CM | POA: Diagnosis present

## 2019-05-28 DIAGNOSIS — Z87891 Personal history of nicotine dependence: Secondary | ICD-10-CM

## 2019-05-28 DIAGNOSIS — I5043 Acute on chronic combined systolic (congestive) and diastolic (congestive) heart failure: Secondary | ICD-10-CM | POA: Diagnosis not present

## 2019-05-28 DIAGNOSIS — G4733 Obstructive sleep apnea (adult) (pediatric): Secondary | ICD-10-CM | POA: Diagnosis present

## 2019-05-28 DIAGNOSIS — E873 Alkalosis: Secondary | ICD-10-CM | POA: Diagnosis present

## 2019-05-28 DIAGNOSIS — I739 Peripheral vascular disease, unspecified: Secondary | ICD-10-CM | POA: Diagnosis present

## 2019-05-28 DIAGNOSIS — Z6833 Body mass index (BMI) 33.0-33.9, adult: Secondary | ICD-10-CM

## 2019-05-28 DIAGNOSIS — Z7901 Long term (current) use of anticoagulants: Secondary | ICD-10-CM

## 2019-05-28 DIAGNOSIS — Z7902 Long term (current) use of antithrombotics/antiplatelets: Secondary | ICD-10-CM

## 2019-05-28 DIAGNOSIS — Z20822 Contact with and (suspected) exposure to covid-19: Secondary | ICD-10-CM | POA: Diagnosis not present

## 2019-05-28 DIAGNOSIS — I701 Atherosclerosis of renal artery: Secondary | ICD-10-CM | POA: Diagnosis present

## 2019-05-28 DIAGNOSIS — N183 Chronic kidney disease, stage 3 unspecified: Secondary | ICD-10-CM | POA: Diagnosis present

## 2019-05-28 DIAGNOSIS — Z91030 Bee allergy status: Secondary | ICD-10-CM

## 2019-05-28 DIAGNOSIS — Z955 Presence of coronary angioplasty implant and graft: Secondary | ICD-10-CM

## 2019-05-28 DIAGNOSIS — Z888 Allergy status to other drugs, medicaments and biological substances status: Secondary | ICD-10-CM

## 2019-05-28 DIAGNOSIS — I4892 Unspecified atrial flutter: Secondary | ICD-10-CM | POA: Diagnosis not present

## 2019-05-28 DIAGNOSIS — Z66 Do not resuscitate: Secondary | ICD-10-CM | POA: Diagnosis not present

## 2019-05-28 DIAGNOSIS — I495 Sick sinus syndrome: Secondary | ICD-10-CM | POA: Diagnosis present

## 2019-05-28 DIAGNOSIS — Z8249 Family history of ischemic heart disease and other diseases of the circulatory system: Secondary | ICD-10-CM

## 2019-05-28 DIAGNOSIS — J189 Pneumonia, unspecified organism: Secondary | ICD-10-CM

## 2019-05-28 DIAGNOSIS — Z9221 Personal history of antineoplastic chemotherapy: Secondary | ICD-10-CM

## 2019-05-28 DIAGNOSIS — I447 Left bundle-branch block, unspecified: Secondary | ICD-10-CM | POA: Diagnosis present

## 2019-05-28 DIAGNOSIS — I200 Unstable angina: Secondary | ICD-10-CM | POA: Diagnosis not present

## 2019-05-28 DIAGNOSIS — Z950 Presence of cardiac pacemaker: Secondary | ICD-10-CM

## 2019-05-28 DIAGNOSIS — D3910 Neoplasm of uncertain behavior of unspecified ovary: Secondary | ICD-10-CM | POA: Diagnosis present

## 2019-05-28 DIAGNOSIS — I482 Chronic atrial fibrillation, unspecified: Secondary | ICD-10-CM

## 2019-05-28 DIAGNOSIS — Z8543 Personal history of malignant neoplasm of ovary: Secondary | ICD-10-CM | POA: Diagnosis not present

## 2019-05-28 DIAGNOSIS — Z833 Family history of diabetes mellitus: Secondary | ICD-10-CM

## 2019-05-28 DIAGNOSIS — I4821 Permanent atrial fibrillation: Secondary | ICD-10-CM | POA: Diagnosis not present

## 2019-05-28 DIAGNOSIS — Z823 Family history of stroke: Secondary | ICD-10-CM

## 2019-05-28 DIAGNOSIS — I509 Heart failure, unspecified: Secondary | ICD-10-CM

## 2019-05-28 DIAGNOSIS — I10 Essential (primary) hypertension: Secondary | ICD-10-CM | POA: Diagnosis present

## 2019-05-28 DIAGNOSIS — I130 Hypertensive heart and chronic kidney disease with heart failure and stage 1 through stage 4 chronic kidney disease, or unspecified chronic kidney disease: Principal | ICD-10-CM | POA: Diagnosis present

## 2019-05-28 DIAGNOSIS — I252 Old myocardial infarction: Secondary | ICD-10-CM

## 2019-05-28 DIAGNOSIS — E669 Obesity, unspecified: Secondary | ICD-10-CM | POA: Diagnosis present

## 2019-05-28 DIAGNOSIS — Z841 Family history of disorders of kidney and ureter: Secondary | ICD-10-CM

## 2019-05-28 DIAGNOSIS — Z79899 Other long term (current) drug therapy: Secondary | ICD-10-CM

## 2019-05-28 DIAGNOSIS — I5040 Unspecified combined systolic (congestive) and diastolic (congestive) heart failure: Secondary | ICD-10-CM | POA: Diagnosis present

## 2019-05-28 LAB — CBC
HCT: 40.9 % (ref 36.0–46.0)
Hemoglobin: 13 g/dL (ref 12.0–15.0)
MCH: 31.9 pg (ref 26.0–34.0)
MCHC: 31.8 g/dL (ref 30.0–36.0)
MCV: 100.2 fL — ABNORMAL HIGH (ref 80.0–100.0)
Platelets: 158 10*3/uL (ref 150–400)
RBC: 4.08 MIL/uL (ref 3.87–5.11)
RDW: 15 % (ref 11.5–15.5)
WBC: 5.5 10*3/uL (ref 4.0–10.5)
nRBC: 0 % (ref 0.0–0.2)

## 2019-05-28 LAB — PROTIME-INR
INR: 2.1 — ABNORMAL HIGH (ref 0.8–1.2)
INR: 1.9 — ABNORMAL HIGH (ref 0.8–1.2)
Prothrombin Time: 23 seconds — ABNORMAL HIGH (ref 11.4–15.2)
Prothrombin Time: 21.5 seconds — ABNORMAL HIGH (ref 11.4–15.2)

## 2019-05-28 LAB — TROPONIN I (HIGH SENSITIVITY)
Troponin I (High Sensitivity): 53 ng/L — ABNORMAL HIGH (ref <18)
Troponin I (High Sensitivity): 61 ng/L — ABNORMAL HIGH (ref <18)
Troponin I (High Sensitivity): 73 ng/L — ABNORMAL HIGH (ref <18)
Troponin I (High Sensitivity): 98 ng/L — ABNORMAL HIGH (ref <18)

## 2019-05-28 LAB — BASIC METABOLIC PANEL
Anion gap: 10 (ref 5–15)
BUN: 30 mg/dL — ABNORMAL HIGH (ref 8–23)
CO2: 30 mmol/L (ref 22–32)
Calcium: 8.7 mg/dL — ABNORMAL LOW (ref 8.9–10.3)
Chloride: 105 mmol/L (ref 98–111)
Creatinine, Ser: 1.05 mg/dL — ABNORMAL HIGH (ref 0.44–1.00)
GFR calc Af Amer: 56 mL/min — ABNORMAL LOW (ref >60)
GFR calc non Af Amer: 48 mL/min — ABNORMAL LOW (ref >60)
Glucose, Bld: 118 mg/dL — ABNORMAL HIGH (ref 70–99)
Potassium: 3.8 mmol/L (ref 3.5–5.1)
Sodium: 145 mmol/L (ref 135–145)

## 2019-05-28 LAB — MAGNESIUM: Magnesium: 1.9 mg/dL (ref 1.7–2.4)

## 2019-05-28 LAB — RESPIRATORY PANEL BY RT PCR (FLU A&B, COVID)
Influenza A by PCR: NEGATIVE
Influenza B by PCR: NEGATIVE
SARS Coronavirus 2 by RT PCR: NEGATIVE

## 2019-05-28 LAB — BRAIN NATRIURETIC PEPTIDE: B Natriuretic Peptide: 1018 pg/mL — ABNORMAL HIGH (ref 0.0–100.0)

## 2019-05-28 LAB — PROCALCITONIN: Procalcitonin: 0.13 ng/mL

## 2019-05-28 MED ORDER — LIDOCAINE 5 % EX PTCH
1.0000 | MEDICATED_PATCH | CUTANEOUS | Status: DC
  Administered 2019-05-28: 04:00:00 1 via TRANSDERMAL
  Filled 2019-05-28 (×3): qty 1

## 2019-05-28 MED ORDER — FUROSEMIDE 10 MG/ML IJ SOLN
40.0000 mg | Freq: Two times a day (BID) | INTRAMUSCULAR | Status: DC
  Administered 2019-05-28 – 2019-05-29 (×3): 40 mg via INTRAVENOUS
  Filled 2019-05-28 (×3): qty 4

## 2019-05-28 MED ORDER — NITROGLYCERIN 0.4 MG SL SUBL
0.4000 mg | SUBLINGUAL_TABLET | Freq: Once | SUBLINGUAL | Status: AC
  Administered 2019-05-28: 02:00:00 0.4 mg via SUBLINGUAL

## 2019-05-28 MED ORDER — DOFETILIDE 125 MCG PO CAPS: 125.0000 ug | ORAL_CAPSULE | Freq: Two times a day (BID) | ORAL | Status: DC

## 2019-05-28 MED ORDER — METOPROLOL SUCCINATE ER 100 MG PO TB24
200.0000 mg | ORAL_TABLET | Freq: Every day | ORAL | Status: DC
  Administered 2019-05-28 – 2019-05-31 (×4): 200 mg via ORAL
  Filled 2019-05-28 (×4): qty 2

## 2019-05-28 MED ORDER — ISOSORBIDE MONONITRATE ER 60 MG PO TB24
90.0000 mg | ORAL_TABLET | Freq: Every day | ORAL | Status: DC
  Administered 2019-05-29 – 2019-05-31 (×3): 90 mg via ORAL
  Filled 2019-05-28 (×3): qty 1

## 2019-05-28 MED ORDER — CLOPIDOGREL BISULFATE 75 MG PO TABS
75.0000 mg | ORAL_TABLET | Freq: Every day | ORAL | Status: DC
  Administered 2019-05-28 – 2019-05-31 (×4): 75 mg via ORAL
  Filled 2019-05-28 (×4): qty 1

## 2019-05-28 MED ORDER — GABAPENTIN 300 MG PO CAPS
300.0000 mg | ORAL_CAPSULE | Freq: Two times a day (BID) | ORAL | Status: DC
  Administered 2019-05-28 – 2019-05-31 (×7): 300 mg via ORAL
  Filled 2019-05-28 (×4): qty 1
  Filled 2019-05-28: qty 3
  Filled 2019-05-28 (×2): qty 1

## 2019-05-28 MED ORDER — MAGNESIUM SULFATE 2 GM/50ML IV SOLN
2.0000 g | Freq: Once | INTRAVENOUS | Status: AC
  Administered 2019-05-28: 21:00:00 2 g via INTRAVENOUS
  Filled 2019-05-28: qty 50

## 2019-05-28 MED ORDER — WARFARIN - PHARMACIST DOSING INPATIENT: Freq: Every day | Status: DC

## 2019-05-28 MED ORDER — FUROSEMIDE 20 MG PO TABS: 40.0000 mg | ORAL_TABLET | ORAL | Status: DC

## 2019-05-28 MED ORDER — NITROGLYCERIN 0.4 MG SL SUBL: 0.4000 mg | SUBLINGUAL_TABLET | SUBLINGUAL | Status: DC | PRN

## 2019-05-28 MED ORDER — WARFARIN SODIUM 1 MG PO TABS: 1.0000 mg | ORAL_TABLET | ORAL | Status: DC

## 2019-05-28 MED ORDER — ASPIRIN 81 MG PO CHEW
324.0000 mg | CHEWABLE_TABLET | Freq: Once | ORAL | Status: AC
  Administered 2019-05-28: 03:00:00 324 mg via ORAL
  Filled 2019-05-28: qty 4

## 2019-05-28 MED ORDER — ISOSORBIDE MONONITRATE ER 60 MG PO TB24
60.0000 mg | ORAL_TABLET | Freq: Every day | ORAL | Status: DC
  Administered 2019-05-28: 09:00:00 60 mg via ORAL
  Filled 2019-05-28: qty 2

## 2019-05-28 MED ORDER — POTASSIUM CHLORIDE CRYS ER 20 MEQ PO TBCR
40.0000 meq | EXTENDED_RELEASE_TABLET | Freq: Once | ORAL | Status: AC
  Administered 2019-05-28: 21:00:00 40 meq via ORAL
  Filled 2019-05-28: qty 2

## 2019-05-28 MED ORDER — ACETAMINOPHEN 500 MG PO TABS: 500.0000 mg | ORAL_TABLET | Freq: Four times a day (QID) | ORAL | Status: DC | PRN

## 2019-05-28 MED ORDER — WARFARIN - PHYSICIAN DOSING INPATIENT: Freq: Every day | Status: DC

## 2019-05-28 MED ORDER — NITROGLYCERIN 0.4 MG SL SUBL
SUBLINGUAL_TABLET | SUBLINGUAL | Status: AC
  Filled 2019-05-28: qty 1

## 2019-05-28 MED ORDER — SODIUM CHLORIDE 0.9 % IV SOLN
INTRAVENOUS | Status: DC | PRN
  Administered 2019-05-28: 250 mL via INTRAVENOUS

## 2019-05-28 MED ORDER — WARFARIN SODIUM 2 MG PO TABS
2.0000 mg | ORAL_TABLET | ORAL | Status: DC
  Administered 2019-05-28: 2 mg via ORAL
  Filled 2019-05-28 (×2): qty 1

## 2019-05-28 MED ORDER — ATORVASTATIN CALCIUM 80 MG PO TABS
80.0000 mg | ORAL_TABLET | Freq: Every day | ORAL | Status: DC
  Administered 2019-05-28 – 2019-05-31 (×4): 80 mg via ORAL
  Filled 2019-05-28 (×4): qty 1

## 2019-05-28 MED ORDER — DOFETILIDE 125 MCG PO CAPS
125.0000 ug | ORAL_CAPSULE | Freq: Two times a day (BID) | ORAL | Status: DC
  Administered 2019-05-29 – 2019-05-31 (×5): 125 ug via ORAL
  Filled 2019-05-28 (×6): qty 1

## 2019-05-28 MED ORDER — NITROGLYCERIN 2 % TD OINT
1.0000 [in_us] | TOPICAL_OINTMENT | Freq: Once | TRANSDERMAL | Status: AC
  Administered 2019-05-28: 03:00:00 1 [in_us] via TOPICAL
  Filled 2019-05-28: qty 1

## 2019-05-28 MED ORDER — ONDANSETRON HCL 4 MG/2ML IJ SOLN: 4.0000 mg | Freq: Four times a day (QID) | INTRAMUSCULAR | Status: DC | PRN

## 2019-05-28 MED ORDER — SODIUM CHLORIDE 0.9% FLUSH
3.0000 mL | Freq: Once | INTRAVENOUS | Status: AC
  Administered 2019-05-28: 03:00:00 3 mL via INTRAVENOUS

## 2019-05-28 MED ORDER — LOSARTAN POTASSIUM 50 MG PO TABS
50.0000 mg | ORAL_TABLET | Freq: Every day | ORAL | Status: DC
  Administered 2019-05-28 – 2019-05-31 (×4): 50 mg via ORAL
  Filled 2019-05-28 (×4): qty 1

## 2019-05-28 MED ORDER — ALBUTEROL SULFATE (2.5 MG/3ML) 0.083% IN NEBU: 2.5000 mg | INHALATION_SOLUTION | RESPIRATORY_TRACT | Status: DC | PRN

## 2019-05-28 NOTE — ED Notes (Signed)
Pt having increased WOB, dyspnea, after adjusting in bed. Changed from 4 lpm [place] to 15 lpm NRB with improved O2 sats.

## 2019-05-28 NOTE — ED Provider Notes (Signed)
[facility] EMERGENCY DEPARTMENT Provider Note   CSN: [id number] Arrival date & time: 05/28/19  0015     History Chief Complaint  Patient presents with  . Chest Pain    Patty [name] is a 86 y.o. female.  Patient presents to the emergency department for evaluation of chest pain.  Patient reports that this evening, prior to going to bed she started to feel ill.  She felt cold and clammy, sweaty.  She did not have any chest pain initially, took a nitroglycerin.  She was able to fall asleep but then 2 hours later woke up with chest pain.  She took an additional nitroglycerin and called 911.  During transport to the hospital she was noted to be in atrial fibrillation.  She was given a third nitroglycerin and is now pain-free.  Patient was also very hypertensive during transport, 216/110.  Blood pressure has significantly improved since nitro was administered.        Past Medical History:  Diagnosis Date  . Bleeding behind the abdominal cavity 10/2017  . Chronic anticoagulation - coumadin, CHADS2VASC=6 05/17/2015  . CKD (chronic kidney disease), stage III   . Combined systolic and diastolic heart failure ([place])   . Coronary artery disease    a. s/p multiple stents - stenting x 2 to the LAD and x 1 to the LCx in 2000, rotational arthrectomy to proximal LCx and DES to LAD in 2014 and DES to LCx in 2014, along with DES to LAD for re-in-stent stenosis in 2015, and DES to ostial LAD in 2016. b. 07/2016 - orbital atherectomy & DES to mid LCx.  . Diverticulitis   . Granulosa cell carcinoma ([place])    abd; last episode was in 2009  . Hyperlipidemia   . Hypertension   . LBBB (left bundle branch block)   . Myocardial infarction ([place]) 2002  . Obesity   . OSA on CPAP   . Ovarian ca ([place]) 2019  . Pacemaker failure    a. Prior leadless PPM with premature battery failure, being managed conservatively without replacement.  . Peripheral vascular disease ([place])   . Permanent atrial  fibrillation ([place]) 2013  . Renal artery stenosis ([place])   . Tachycardia-bradycardia syndrome ([place])    a. s/p leadless pacemaker ([name]) implanted by Dr [name]  . Varicose veins     Patient Active Problem List   Diagnosis Date Noted  . Stable angina ([place]) 10/09/2018  . Coagulopathy ([place]) 10/09/2018  . Acute on chronic combined systolic and diastolic CHF (congestive heart failure) ([place]) 10/08/2018  . Peripheral neuropathy due to chemotherapy ([place]) 11/19/2017  . Pancytopenia, acquired ([place]) 11/19/2017  . Acute on chronic combined systolic (congestive) and diastolic (congestive) heart failure ([place]) 11/03/2017  . Hyperglycemia 11/03/2017  . Protein-calorie malnutrition, moderate ([place]) 10/28/2017  . Other constipation 10/20/2017  . Postherpetic neuralgia 10/20/2017  . Cancer associated pain 10/20/2017  . Physical debility 10/20/2017  . Goals of care, counseling/discussion 10/20/2017  . Ovarian cancer ([place]) 10/19/2017  . Intraperitoneal hemorrhage   . Abdominal pain 10/12/2017  . Pain of joint of left ankle and foot 08/03/2017  . Persistent atrial fibrillation   . Warfarin anticoagulation   . DOE (dyspnea on exertion)   . Status post coronary artery stent placement   . Normocytic anemia 07/11/2016  . History of small bowel obstruction 12/22/2015  . Permanent atrial fibrillation with RVR 12/15/2015  . Demand ischemia ([place]) 12/15/2015  . NSTEMI (non-ST elevated myocardial infarction) ([place]) 05/18/2015  .  Chronic anticoagulation - coumadin, CHADS2VASC=6 05/17/2015  . Unstable angina ([place]) 05/17/2015  . Swelling of lower extremity 04/18/2015  . Encounter for therapeutic drug monitoring 08/25/2014  . 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[place]) 06/16/2014  . Ischemic chest pain ([place])   . OSA on CPAP 05/14/2014  . Chest pain 01/05/2014  . Atrial flutter ([place])   . Hypokalemia   . Pacemaker - St Jude Leadless PPM   . Combined systolic and diastolic heart failure, NYHA class 3 ([place])     . Tachycardia-bradycardia syndrome ([place]) 09/30/2012  . Atrial fibrillation with RVR ([place]) 12/28/2011  . Ventral hernia 10/16/2011  . Granulosa cell tumor of ovary 04/23/2011  . CAD (coronary artery disease) 10/03/2010  . Essential hypertension 10/03/2010  . Hyperlipidemia 10/03/2010  . Renal artery stenosis ([place]) 10/03/2010  . Obesity 10/03/2010    Past Surgical History:  Procedure Laterality Date  . ABDOMINAL HYSTERECTOMY    . CARDIAC CATHETERIZATION  09/03/2007   EF 70%; Failed attempt at PCI to OM  . CARDIAC CATHETERIZATION  11/01/2003   EF 70%  . CARDIAC CATHETERIZATION N/A 12/14/2015   Procedure: Left Heart Cath and Coronary Angiography;  Surgeon: Burnell Blanks, MD;  Location: [facility] CV LAB;  Service: Cardiovascular;  Laterality: N/A;  . CARDIOVERSION  12/31/2011   Procedure: CARDIOVERSION;  Surgeon: [name], MD;  Location: [facility] ENDOSCOPY;  Service: Cardiovascular;  Laterality: N/A;  . CARDIOVERSION N/A 12/31/2011   Procedure: CARDIOVERSION;  Surgeon: [name], MD;  Location: [facility] C[facility] LAB;  Service: Cardiovascular;  Laterality: N/A;  . CARDIOVERSION N/A 11/15/2018   Procedure: CARDIOVERSION;  Surgeon: Dorothy Spark, MD;  Location: [facility];  Service: Cardiovascular;  Laterality: N/A;  . CATARACT EXTRACTION, BILATERAL  2015  . CHOLECYSTECTOMY  1980's  . COLON SURGERY  2004   colectomy for diverticulosis  . CORONARY ANGIOPLASTY WITH STENT PLACEMENT  2000    and 08/11/2012; 11/12/2012: 3 + 2 LAD & CFX; 2nd CFX stent 11/12/2012  . CORONARY ATHERECTOMY N/A 07/15/2016   Procedure: Coronary Atherectomy;  Surgeon: Martinique, Peter M, MD;  Location: [facility] CV LAB;  Service: Cardiovascular;  Laterality: N/A;  . CORONARY BALLOON ANGIOPLASTY N/A 07/14/2016   Procedure: Coronary Balloon Angioplasty;  Surgeon: Martinique, Peter M, MD;  Location: [facility] CV LAB;  Service: Cardiovascular;  Laterality: N/A;  . CORONARY STENT INTERVENTION N/A 07/15/2016    Procedure: Coronary Stent Intervention;  Surgeon: Martinique, Peter M, MD;  Location: [facility] CV LAB;  Service: Cardiovascular;  Laterality: N/A;  . FRACTIONAL FLOW RESERVE WIRE  10/07/2013   Procedure: [place];  Surgeon: [name], MD;  Location: [facility] CATH LAB;  Service: Cardiovascular;;  . HERNIA REPAIR  2005   "laparoscopic"  . IR IMAGING GUIDED PORT INSERTION  10/28/2017  . LEFT HEART CATH AND CORONARY ANGIOGRAPHY N/A 07/14/2016   Procedure: Left Heart Cath and Coronary Angiography;  Surgeon: Martinique, Peter M, MD;  Location: [facility] CV LAB;  Service: Cardiovascular;  Laterality: N/A;  . LEFT HEART CATH AND CORONARY ANGIOGRAPHY N/A 11/10/2018   Procedure: LEFT HEART CATH AND CORONARY ANGIOGRAPHY;  Surgeon: Wellington Hampshire, MD;  Location: [facility] CV LAB;  Service: Cardiovascular;  Laterality: N/A;  . LEFT HEART CATHETERIZATION WITH CORONARY ANGIOGRAM N/A 11/12/2012   Procedure: LEFT HEART CATHETERIZATION WITH CORONARY ANGIOGRAM;  Surgeon: [name], MD;  Location: [facility] CATH LAB;  Service: Cardiovascular;  Laterality: N/A;  . LEFT HEART CATHETERIZATION WITH CORONARY ANGIOGRAM N/A  10/07/2013   Procedure: LEFT HEART CATHETERIZATION WITH CORONARY ANGIOGRAM;  Surgeon: [name], MD;  Location: [facility] CATH LAB;  Service: Cardiovascular;  Laterality: N/A;  . LEFT HEART CATHETERIZATION WITH CORONARY ANGIOGRAM N/A 12/14/2013   Procedure: LEFT HEART CATHETERIZATION WITH CORONARY ANGIOGRAM;  Surgeon: Sinclair Grooms, MD;  Location: [facility] CATH LAB;  Service: Cardiovascular;  Laterality: N/A;  . LEFT HEART CATHETERIZATION WITH CORONARY ANGIOGRAM N/A 05/16/2014   Procedure: LEFT HEART CATHETERIZATION WITH CORONARY ANGIOGRAM;  Surgeon: Sherren Mocha, MD;  Location: [facility] CATH LAB;  Service: Cardiovascular;  Laterality: N/A;  . PERCUTANEOUS CORONARY INTERVENTION-BALLOON ONLY  08/04/2012   Procedure: PERCUTANEOUS CORONARY INTERVENTION-BALLOON ONLY;  Surgeon: [name], MD;  Location: [facility] CATH LAB;  Service: Cardiovascular;;  . PERCUTANEOUS CORONARY ROTOBLATOR INTERVENTION (PCI-R) N/A 08/11/2012   Procedure: PERCUTANEOUS CORONARY ROTOBLATOR INTERVENTION (PCI-R);  Surgeon: [name], MD;  Location: [facility] CATH LAB;  Service: Cardiovascular;  Laterality: N/A;  . PERCUTANEOUS CORONARY STENT INTERVENTION (PCI-S)  10/07/2013   Procedure: PERCUTANEOUS CORONARY STENT INTERVENTION (PCI-S);  Surgeon: [name], MD;  Location: [facility] CATH LAB;  Service: Cardiovascular;;  . PERMANENT PACEMAKER INSERTION N/A 03/16/2012   [name] (SJM) leadless pacemaker (LEADLESS II STUDY PATEINT)  . SALIVARY GLAND SURGERY  2000's   "had a little lump removed; granulosa related; it was benign" (08/11/2012)  . TEE WITHOUT CARDIOVERSION  12/31/2011   Procedure: TRANSESOPHAGEAL ECHOCARDIOGRAM (TEE);  Surgeon: [name], MD;  Location: [facility] ENDOSCOPY;  Service: Cardiovascular;  Laterality: N/A;  . UMBILICAL GRANULOMA EXCISION  2000   2003; 2004; 2007: "all in my abdomen including small intestines, outside my ?uterus/etc" (08/11/2012)  . VARICOSE VEIN SURGERY Bilateral 1977     OB History   No obstetric history on file.     Family History  Problem Relation Age of Onset  . Stroke Mother   . Heart attack Father   . Diabetes Father   . Hypertension Father   . Heart attack Brother   . Diabetes Brother   . Hypertension Brother   . Kidney failure Brother     Social History   Tobacco Use  . Smoking status: Former Smoker    Packs/day: 1.00    Years: 32.00    Pack years: 32.00    Types: Cigarettes    Quit date: 02/03/1974    Years since quitting: 45.3  . Smokeless tobacco: Never Used  Substance Use Topics  . Alcohol use: Yes    Alcohol/week: 7.0 standard drinks    Types: 7 Glasses of wine per week    Comment: one glass wine nightly with dinner  . Drug use: No    Home Medications Prior to Admission medications   Medication Sig Start Date End Date  Taking? Authorizing Provider  acetaminophen (TYLENOL) 500 MG tablet Take 500 mg by mouth every 6 (six) hours as needed for mild pain or headache.    Yes [provider]  atorvastatin (LIPITOR) 80 MG tablet TAKE 1 TABLET BY MOUTH EVERY DAY IN THE MORNING Patient taking differently: Take 80 mg by mouth daily.  04/04/19  Yes [name], MD  beta carotene w/minerals (OCUVITE) tablet Take 1 tablet by mouth 2 (two) times daily.    Yes [provider]  Calcium Carb-Cholecalciferol (CALTRATE 600+D) 600-800 MG-UNIT TABS Take 1 tablet by mouth every evening.    Yes [provider]  clopidogrel (PLAVIX) 75 MG tablet TAKE 1 TABLET BY MOUTH EVERY DAY Patient taking differently: Take 75 mg  by mouth daily.  03/15/19  Yes [name], MD  Coenzyme Q-10 100 MG capsule Take 200 mg by mouth at bedtime.    Yes [provider]  docusate sodium (COLACE) 100 MG capsule Take 100 mg by mouth daily as needed for mild constipation (If no BM after 2 days.).    Yes [provider]  dofetilide (TIKOSYN) 125 MCG capsule Take 1 capsule (125 mcg total) by mouth 2 (two) times daily. 12/16/18  Yes Sherran Needs, NP  furosemide (LASIX) 40 MG tablet Take 2 tablets by mouth every morning and 2 additional tabs as needed for wt gain/abdominal distension Patient taking differently: Take 40 mg by mouth See admin instructions. Take 2 tablets by mouth every morning and 2 additional tabs as needed for wt gain/abdominal distension  03/30/19  Yes [name], MD  gabapentin (NEURONTIN) 300 MG capsule TAKE 1 CAPSULE BY MOUTH TWICE A DAY Patient taking differently: Take 300 mg by mouth 2 (two) times daily.  04/25/19  Yes Gorsuch, Ni, MD  isosorbide mononitrate (IMDUR) 60 MG 24 hr tablet TAKE 1 TABLET BY MOUTH EVERY DAY Patient taking differently: Take 60 mg by mouth daily.  02/08/19  Yes [name], MD  losartan (COZAAR) 50 MG tablet Take 1 tablet (50 mg total) by mouth  daily. 04/22/19  Yes Sherran Needs, NP  metoprolol (TOPROL-XL) 200 MG 24 hr tablet Take 1 tablet (200 mg total) by mouth daily. Take with or immediately following a meal. 12/10/18  Yes Sherran Needs, NP  Multiple Vitamin (MULTIVITAMIN) capsule Take 1 capsule by mouth daily.   Yes [provider]  Multiple Vitamins-Minerals (PRESERVISION AREDS 2 PO) Take 1 capsule by mouth 2 (two) times daily.   Yes [provider]  nitroGLYCERIN (NITROSTAT) 0.4 MG SL tablet PLACE 1 TABLET (0.4 MG TOTAL) UNDER THE TONGUE EVERY 5 (FIVE) MINUTES AS NEEDED FOR CHEST PAIN Patient taking differently: Place 0.4 mg under the tongue every 5 (five) minutes as needed for chest pain.  05/26/19  Yes Sherran Needs, NP  Omega-3 Fatty Acids (FISH OIL) 1200 MG CAPS Take 1,200 mg by mouth daily.    Yes [provider]  potassium chloride SA (KLOR-CON) 20 MEQ tablet Take 1 tablet (20 mEq total) by mouth 2 (two) times daily. 01/21/19  Yes Allred, Jeneen Rinks, MD  warfarin (COUMADIN) 2 MG tablet TAKE AS DIRECTED BY COUMADIN CLINIC Patient taking differently: Take 1-2 mg by mouth See admin instructions. Take 1 tablet on Tuesday, Thursday, Saturday, and Sunday and take 1/2 on Monday, Wednesday, and Friday. 04/22/19  Yes [name], MD  lidocaine-prilocaine (EMLA) cream Apply to [facility] site 1-2 hours prior to access Patient not taking: Reported on 05/28/2019 09/30/18   Joylene John D, NP  losartan (COZAAR) 50 MG tablet Take 1 tablet (50 mg total) by mouth daily. Patient not taking: Reported on 05/28/2019 03/31/19   [name], MD    Allergies    Bee venom, Other, Oxycodone hcl, Amiodarone, and Prednisone  Review of Systems   Review of Systems  Constitutional: Positive for diaphoresis.  Cardiovascular: Positive for chest pain.  All other systems reviewed and are negative.   Physical Exam Updated Vital Signs BP (!) 141/104 (BP Location: Right Arm)   Pulse 92   Temp 97.8 F (36.6 C)  (Oral)   Resp (!) 22   Ht 5' (1.524 m)   Wt 78.5 kg   SpO2 95%   BMI 33.79 kg/m  Physical Exam Vitals and nursing note reviewed.  Constitutional:      General: She is not in acute distress.    Appearance: Normal appearance. She is well-developed.  HENT:     Head: Normocephalic and atraumatic.     Right Ear: Hearing normal.     Left Ear: Hearing normal.     Nose: Nose normal.  Eyes:     Conjunctiva/sclera: Conjunctivae normal.     Pupils: Pupils are equal, round, and reactive to light.  Cardiovascular:     Rate and Rhythm: Rhythm irregularly irregular.     Heart sounds: S1 normal and S2 normal. No murmur. No friction rub. No gallop.   Pulmonary:     Effort: Pulmonary effort is normal. No respiratory distress.     Breath sounds: Normal breath sounds.  Chest:     Chest wall: No tenderness.  Abdominal:     General: Bowel sounds are normal.     Palpations: Abdomen is soft.     Tenderness: There is no abdominal tenderness. There is no guarding or rebound. Negative signs include Murphy's sign and McBurney's sign.     Hernia: No hernia is present.  Musculoskeletal:        General: Normal range of motion.     Cervical back: Normal range of motion and neck supple.  Skin:    General: Skin is warm and dry.     Findings: No rash.  Neurological:     Mental Status: She is alert and oriented to person, place, and time.     GCS: GCS eye subscore is 4. GCS verbal subscore is 5. GCS motor subscore is 6.     Cranial Nerves: No cranial nerve deficit.     Sensory: No sensory deficit.     Coordination: Coordination normal.  Psychiatric:        Speech: Speech normal.        Behavior: Behavior normal.        Thought Content: Thought content normal.     ED Results / Procedures / Treatments   Labs (all labs ordered are listed, but only abnormal results are displayed) Labs Reviewed  BASIC METABOLIC PANEL - Abnormal; Notable for the following components:      Result Value   Glucose,  Bld 118 (*)    BUN 30 (*)    Creatinine, Ser 1.05 (*)    Calcium 8.7 (*)    GFR calc non Af Amer 48 (*)    GFR calc Af Amer 56 (*)    All other components within normal limits  CBC - Abnormal; Notable for the following components:   MCV 100.2 (*)    All other components within normal limits  PROTIME-INR - Abnormal; Notable for the following components:   Prothrombin Time 23.0 (*)    INR 2.1 (*)    All other components within normal limits  TROPONIN I (HIGH SENSITIVITY) - Abnormal; Notable for the following components:   Troponin I (High Sensitivity) 53 (*)    All other components within normal limits  RESPIRATORY PANEL BY RT PCR (FLU A&B, COVID)  TROPONIN I (HIGH SENSITIVITY)    EKG EKG Interpretation  Date/Time:  Saturday May 28 2019 00:21:29 EDT Ventricular Rate:  103 PR Interval:    QRS Duration: 164 QT Interval:  463 QTC Calculation: 607 R Axis:   -96 Text Interpretation: Atrial fibrillation Left bundle branch block Confirmed by Orpah Greek [phone]) on 05/28/2019 12:37:33 AM   Radiology DG Chest 2  View  Result Date: 05/28/2019 CLINICAL DATA:  Chest pain. EXAM: CHEST - 2 VIEW COMPARISON:  March 08, 2019 FINDINGS: The there is stable right-sided venous Port-A-Cath positioning. A dual lead AICD is noted. Mild atelectasis and/or infiltrate is seen within the retrocardiac region of the left lung base. There is no evidence of a pleural effusion or pneumothorax. The cardiac silhouette is markedly enlarged and unchanged in size. There is marked severity calcification of the aortic arch. The visualized skeletal structures are unremarkable. IMPRESSION: Mild left basilar atelectasis and/or infiltrate. Electronically Signed   By: Virgina Norfolk M.D.   On: 05/28/2019 01:43    Procedures Procedures (including critical care time)  Medications Ordered in ED Medications  sodium chloride flush (NS) 0.9 % injection 3 mL (3 mLs Intravenous Given 05/28/19 0239)    nitroGLYCERIN (NITROSTAT) SL tablet 0.4 mg (0.4 mg Sublingual Given 05/28/19 0154)  nitroGLYCERIN (NITROGLYN) 2 % ointment 1 inch (1 inch Topical Given 05/28/19 0235)  aspirin chewable tablet 324 mg (324 mg Oral Given 05/28/19 0237)    ED Course  I have reviewed the triage vital signs and the nursing notes.  Pertinent labs & imaging results that were available during my care of the patient were reviewed by me and considered in my medical decision making (see chart for details).    MDM Rules/Calculators/A&P                     Patient presents to the emergency department for evaluation of chest pain.  Patient has had multiple episodes of chest discomfort that resolved with nitroglycerin tonight.  She has a history of atrial fibrillation.  She had her pacemaker interrogated yesterday in the office and was told that she was in atrial fibrillation approximately 6% of the time.  She is in atrial fibrillation on arrival tonight.  She is rate controlled.  She has therapeutically anticoagulated on Coumadin.  Patient had recurrence of discomfort in the chest that radiated to the jaw here in the ER.  This was present for a couple of minutes until she received a nitroglycerin and then resolved.  Nitropaste was placed on her chest.  She has a very slight elevation of her troponin.  Discussed with on-call cardiology, recommends admission to medicine, cycle enzymes.  No additional anticoagulation, patient therapeutic with Coumadin anticoagulation.  CRITICAL CARE Performed by: Orpah Greek   Total critical care time: 35 minutes  Critical care time was exclusive of separately billable procedures and treating other patients.  Critical care was necessary to treat or prevent imminent or life-threatening deterioration.  Critical care was time spent personally by me on the following activities: development of treatment plan with patient and/or surrogate as well as nursing, discussions with consultants,  evaluation of patient's response to treatment, examination of patient, obtaining history from patient or surrogate, ordering and performing treatments and interventions, ordering and review of laboratory studies, ordering and review of radiographic studies, pulse oximetry and re-evaluation of patient's condition.   CHA2DS2-VASc Score = 6 The patient's score is based upon: CHF History: Yes HTN History: Yes Age : 67 + Diabetes History: No Stroke History: No Vascular Disease History: Yes Gender: Female      ASSESSMENT AND PLAN: Paroxysmal Atrial Fibrillation (ICD10:  I48.0) The patient's CHA2DS2-VASc score is 6, indicating a 9.7% annual risk of stroke.        Signed,  Orpah Greek, MD    05/28/2019 3:15 AM     Final Clinical Impression(s) /  ED Diagnoses Final diagnoses:  Unstable angina [facility])    Rx / DC Orders ED Discharge Orders    None       Roseanne Juenger, Gwenyth Allegra, MD 05/28/19 [phone]

## 2019-05-28 NOTE — ED Notes (Signed)
Pt called this nurse to room for chest pressure now radiating to jaw. MD notified.

## 2019-05-28 NOTE — Progress Notes (Signed)
33 white female tachybradycardia syndrome + leadless PPM-?  Battery failure CAD/stents Granulosa cell tumor of the ovary-under care of Dr. Simeon Craft such-now under consideration for focus on palliative and comfort at end-of-life per last office visit note 05/03/2019 CKD 3/3B with LBBB RAS PVD OSA  It appears was admitted 10 6-10/13 with AECHF persistent A. fib-cardiac cath 11/10/2018 with underlying three-vessel disease and a severely reduced LV systolic function [id number] was also cardioverted on 11/15/2022 persistent atrial fibrillation   Patient was seen at office visit with Roderic Palau and was out of rhythm she was feeling well and was told to continue dofetilide 125 mcg twice daily and metoprolol XL 200 daily it is noted in that note that she used nitroglycerin as well recently  She presents back as per my partner with chest pain responsive to nitroglycerin found to be in A. fib took a couple more nitro- Troponin was 53-->61 Patient is compliant on her medications typically-is on goal-directed therapy ARB Imdur metoprolol Also appears to be on Tikosyn  EDP spoke to cardiology who recommended to observe trend and reconsult as as needed Troponin has risen to 98 Her BNP is 1018 her magnesium is 1.9 Her INR is 1.9 as well Procalcitonin 1.3 arguing more likely against infection but unclear CXR?  Infiltrate  Morning EKG shows LBBB by [place] criteria I am not able to tell any depressions V5 V1 through 3-compared to prior EKG performed 02/24/2019 ST-T wave and rate are changed but no specific other changes that I can discern  She tells me when I talked her that she does not typically have chest pain she normally takes nitroglycerin for sometimes relaxation to allow her to sleep She took the nitroglycerin last night at 9 PM and then felt worse and awoke at 11 PM took some more and decided to come to the hospital at the urging of her children who are in town  It appears that she had increasing  dyspnea overnight and was changed to 15 L-patient does not use oxygen at home but uses CPAP at night I was able to down titrate to 4 L but she is still winded and unable to complete sentences  She relates that she is normally has some amount of chest pain but is able to typically self manage with her chronic diseases at home-last night was unusual in that she had severe symptoms  P I will consult cardiology for help with management we will diurese her and increase Lasix to 40 twice daily as when she lays flat in the bed in the emergency room she feels more winded but sitting at 45 degrees she feels better and is able to verbalize better We will de-escalate her oxygen off of 15 to 4 L She will need nightly CPAP We will repeat labs in the morning I do not think she is a great candidate for any type of intervention given her advanced disease and cancer state but will defer this and shared decision making with cardiology

## 2019-05-28 NOTE — H&P (Addendum)
[facility] History and Physical  Patty [name] [id number] DOB: 1930/12/07 DOA: 05/28/2019  Referring EDP: Betsey Holiday PCP: [name], MD   Chief Complaint: Chest Pain  HPI: Patty [name] is a 86 y.o. female with PMH CAD s/p multiple stents, chronic combined CHF, permanent atrial fib/flutter on Coumadin with tachy-brady syndrome s/p leadless PPM (with premature battery failure no longer active - being followed conservatively for now), probable CKD III per labs, granulosa cell carcinoma, HTN, HLD, LBBB, varicose veins, renal artery stenosis, PVD, diverticulitis, OSA who presented to ED with chest pain and admitted for ACS rule-out.  Patient reports that she was feeling fine yesterday until she was just about to go to bed. Reports feeling sweaty and cold and took a Nitro. She was able to fall asleep but then woke up with chest pain and took another Nitro. She then took another a few minutes later and called EMS. She was noted to be in atrial fibrillation by EMS and given more Nitro and an additional 2 more Nitro in ED. Patient reports pain located centrally over chest which does not radiate. She is short of breath and reports it is hard to talk because of this. Pain is not worsened by movement of deep breathing. Reports pain like this previously. She has been complaint with medications. Some nausea with the pain. Denies headache, dizziness, fever, chills, cough, abdominal pain, vomiting, diarrhea, constipation, dysuria, hematuria, hematochezia, melena, difficulty moving arms/legs, speech difficulty, trouble eating, confusion or any other complaints.  In the ED: Hypertensive with intermittently increased RR. Initially not on O2 but placed on O2 just prior to hospitalist interview. Labs remarkable for: Cr 1.05. Trop 53>61. INR 2.1. CBC WNL.  CXR: Mild left basilar atelectasis and/or infiltrate. EKG notable for rate-controlled a fib with LBBB. Patient was given Aspirin, Nitro x2. EDP spoke  with Cardiology who recommended observation to trend trops and re-consult Cardiology as needed.   Review of Systems:  All other systems negative unless noted above in HPI.   Past Medical History:  Diagnosis Date  . Bleeding behind the abdominal cavity 10/2017  . Chronic anticoagulation - coumadin, CHADS2VASC=6 05/17/2015  . CKD (chronic kidney disease), stage III   . Combined systolic and diastolic heart failure ([place])   . Coronary artery disease    a. s/p multiple stents - stenting x 2 to the LAD and x 1 to the LCx in 2000, rotational arthrectomy to proximal LCx and DES to LAD in 2014 and DES to LCx in 2014, along with DES to LAD for re-in-stent stenosis in 2015, and DES to ostial LAD in 2016. b. 07/2016 - orbital atherectomy & DES to mid LCx.  . Diverticulitis   . Granulosa cell carcinoma ([place])    abd; last episode was in 2009  . Hyperlipidemia   . Hypertension   . LBBB (left bundle branch block)   . Myocardial infarction ([place]) 2002  . Obesity   . OSA on CPAP   . Ovarian ca ([place]) 2019  . Pacemaker failure    a. Prior leadless PPM with premature battery failure, being managed conservatively without replacement.  . Peripheral vascular disease ([place])   . Permanent atrial fibrillation ([place]) 2013  . Renal artery stenosis ([place])   . Tachycardia-bradycardia syndrome ([place])    a. s/p leadless pacemaker ([name]) implanted by Dr [name]  . Varicose veins    Past Surgical History:  Procedure Laterality Date  . ABDOMINAL HYSTERECTOMY    . CARDIAC CATHETERIZATION  09/03/2007  EF 70%; Failed attempt at PCI to OM  . CARDIAC CATHETERIZATION  11/01/2003   EF 70%  . CARDIAC CATHETERIZATION N/A 12/14/2015   Procedure: Left Heart Cath and Coronary Angiography;  Surgeon: Burnell Blanks, MD;  Location: [place] CV LAB;  Service: Cardiovascular;  Laterality: N/A;  . CARDIOVERSION  12/31/2011   Procedure: CARDIOVERSION;  Surgeon: [name], MD;  Location: [facility] ENDOSCOPY;  Service:  Cardiovascular;  Laterality: N/A;  . CARDIOVERSION N/A 12/31/2011   Procedure: CARDIOVERSION;  Surgeon: [name], MD;  Location: [facility] CATH LAB;  Service: Cardiovascular;  Laterality: N/A;  . CARDIOVERSION N/A 11/15/2018   Procedure: CARDIOVERSION;  Surgeon: Dorothy Spark, MD;  Location: [facility];  Service: Cardiovascular;  Laterality: N/A;  . CATARACT EXTRACTION, BILATERAL  2015  . CHOLECYSTECTOMY  1980's  . COLON SURGERY  2004   colectomy for diverticulosis  . CORONARY ANGIOPLASTY WITH STENT PLACEMENT  2000    and 08/11/2012; 11/12/2012: 3 + 2 LAD & CFX; 2nd CFX stent 11/12/2012  . CORONARY ATHERECTOMY N/A 07/15/2016   Procedure: Coronary Atherectomy;  Surgeon: Martinique, Peter M, MD;  Location: [place] CV LAB;  Service: Cardiovascular;  Laterality: N/A;  . CORONARY BALLOON ANGIOPLASTY N/A 07/14/2016   Procedure: Coronary Balloon Angioplasty;  Surgeon: Martinique, Peter M, MD;  Location: [place] CV LAB;  Service: Cardiovascular;  Laterality: N/A;  . CORONARY STENT INTERVENTION N/A 07/15/2016   Procedure: Coronary Stent Intervention;  Surgeon: Martinique, Peter M, MD;  Location: [place] CV LAB;  Service: Cardiovascular;  Laterality: N/A;  . FRACTIONAL FLOW RESERVE WIRE  10/07/2013   Procedure: [place];  Surgeon: [name], MD;  Location: [facility] CATH LAB;  Service: Cardiovascular;;  . HERNIA REPAIR  2005   "laparoscopic"  . IR IMAGING GUIDED PORT INSERTION  10/28/2017  . LEFT HEART CATH AND CORONARY ANGIOGRAPHY N/A 07/14/2016   Procedure: Left Heart Cath and Coronary Angiography;  Surgeon: Martinique, Peter M, MD;  Location: [place] CV LAB;  Service: Cardiovascular;  Laterality: N/A;  . LEFT HEART CATH AND CORONARY ANGIOGRAPHY N/A 11/10/2018   Procedure: LEFT HEART CATH AND CORONARY ANGIOGRAPHY;  Surgeon: Wellington Hampshire, MD;  Location: [place] CV LAB;  Service: Cardiovascular;  Laterality: N/A;  . LEFT HEART CATHETERIZATION WITH CORONARY ANGIOGRAM  N/A 11/12/2012   Procedure: LEFT HEART CATHETERIZATION WITH CORONARY ANGIOGRAM;  Surgeon: [name], MD;  Location: [facility] CATH LAB;  Service: Cardiovascular;  Laterality: N/A;  . LEFT HEART CATHETERIZATION WITH CORONARY ANGIOGRAM N/A 10/07/2013   Procedure: LEFT HEART CATHETERIZATION WITH CORONARY ANGIOGRAM;  Surgeon: [name], MD;  Location: [facility] CATH LAB;  Service: Cardiovascular;  Laterality: N/A;  . LEFT HEART CATHETERIZATION WITH CORONARY ANGIOGRAM N/A 12/14/2013   Procedure: LEFT HEART CATHETERIZATION WITH CORONARY ANGIOGRAM;  Surgeon: Sinclair Grooms, MD;  Location: [facility] CATH LAB;  Service: Cardiovascular;  Laterality: N/A;  . LEFT HEART CATHETERIZATION WITH CORONARY ANGIOGRAM N/A 05/16/2014   Procedure: LEFT HEART CATHETERIZATION WITH CORONARY ANGIOGRAM;  Surgeon: Sherren Mocha, MD;  Location: [facility] CATH LAB;  Service: Cardiovascular;  Laterality: N/A;  . PERCUTANEOUS CORONARY INTERVENTION-BALLOON ONLY  08/04/2012   Procedure: PERCUTANEOUS CORONARY INTERVENTION-BALLOON ONLY;  Surgeon: [name], MD;  Location: [facility] CATH LAB;  Service: Cardiovascular;;  . PERCUTANEOUS CORONARY ROTOBLATOR INTERVENTION (PCI-R) N/A 08/11/2012   Procedure: PERCUTANEOUS CORONARY ROTOBLATOR INTERVENTION (PCI-R);  Surgeon: [name], MD;  Location: [facility] CATH LAB;  Service: Cardiovascular;  Laterality: N/A;  . PERCUTANEOUS CORONARY  STENT INTERVENTION (PCI-S)  10/07/2013   Procedure: PERCUTANEOUS CORONARY STENT INTERVENTION (PCI-S);  Surgeon: [name], MD;  Location: [facility] CATH LAB;  Service: Cardiovascular;;  . PERMANENT PACEMAKER INSERTION N/A 03/16/2012   [name] (SJM) leadless pacemaker (LEADLESS II STUDY PATEINT)  . SALIVARY GLAND SURGERY  2000's   "had a little lump removed; granulosa related; it was benign" (08/11/2012)  . TEE WITHOUT CARDIOVERSION  12/31/2011   Procedure: TRANSESOPHAGEAL ECHOCARDIOGRAM (TEE);  Surgeon: [name], MD;  Location: [facility] ENDOSCOPY;  Service:  Cardiovascular;  Laterality: N/A;  . UMBILICAL GRANULOMA EXCISION  2000   2003; 2004; 2007: "all in my abdomen including small intestines, outside my ?uterus/etc" (08/11/2012)  . VARICOSE VEIN SURGERY Bilateral 1977   Social History:  reports that she quit smoking about 45 years ago. Her smoking use included cigarettes. She has a 32.00 pack-year smoking history. She has never used smokeless tobacco. She reports current alcohol use of about 7.0 standard drinks of alcohol per week. She reports that she does not use drugs.  Allergies  Allergen Reactions  . Bee Venom Anaphylaxis  . Other Nausea And Vomiting and Other (See Comments)    Pain medications cause severe vomiting. Tolerated slow IV morphine drip  . Oxycodone Hcl Nausea And Vomiting  . Amiodarone Nausea Only  . Prednisone Palpitations and Other (See Comments)    "Rapid Heart Beat"    Family History  Problem Relation Age of Onset  . Stroke Mother   . Heart attack Father   . Diabetes Father   . Hypertension Father   . Heart attack Brother   . Diabetes Brother   . Hypertension Brother   . Kidney failure Brother     Prior to Admission medications   Medication Sig Start Date End Date Taking? Authorizing Provider  acetaminophen (TYLENOL) 500 MG tablet Take 500 mg by mouth every 6 (six) hours as needed for mild pain or headache.    Yes [provider]  atorvastatin (LIPITOR) 80 MG tablet TAKE 1 TABLET BY MOUTH EVERY DAY IN THE MORNING Patient taking differently: Take 80 mg by mouth daily.  04/04/19  Yes [name], MD  beta carotene w/minerals (OCUVITE) tablet Take 1 tablet by mouth 2 (two) times daily.    Yes [provider]  Calcium Carb-Cholecalciferol (CALTRATE 600+D) 600-800 MG-UNIT TABS Take 1 tablet by mouth every evening.    Yes [provider]  clopidogrel (PLAVIX) 75 MG tablet TAKE 1 TABLET BY MOUTH EVERY DAY Patient taking differently: Take 75 mg by mouth daily.  03/15/19  Yes [name], MD  Coenzyme Q-10 100 MG capsule Take 200 mg by mouth at bedtime.    Yes [provider]  docusate sodium (COLACE) 100 MG capsule Take 100 mg by mouth daily as needed for mild constipation (If no BM after 2 days.).    Yes [provider]  dofetilide (TIKOSYN) 125 MCG capsule Take 1 capsule (125 mcg total) by mouth 2 (two) times daily. 12/16/18  Yes Sherran Needs, NP  furosemide (LASIX) 40 MG tablet Take 2 tablets by mouth every morning and 2 additional tabs as needed for wt gain/abdominal distension Patient taking differently: Take 40 mg by mouth See admin instructions. Take 2 tablets by mouth every morning and 2 additional tabs as needed for wt gain/abdominal distension  03/30/19  Yes [name], MD  gabapentin (NEURONTIN) 300 MG capsule TAKE 1 CAPSULE BY MOUTH TWICE A DAY Patient taking differently: Take 300  mg by mouth 2 (two) times daily.  04/25/19  Yes Gorsuch, Ni, MD  isosorbide mononitrate (IMDUR) 60 MG 24 hr tablet TAKE 1 TABLET BY MOUTH EVERY DAY Patient taking differently: Take 60 mg by mouth daily.  02/08/19  Yes [name], MD  losartan (COZAAR) 50 MG tablet Take 1 tablet (50 mg total) by mouth daily. 04/22/19  Yes Sherran Needs, NP  metoprolol (TOPROL-XL) 200 MG 24 hr tablet Take 1 tablet (200 mg total) by mouth daily. Take with or immediately following a meal. 12/10/18  Yes Sherran Needs, NP  Multiple Vitamin (MULTIVITAMIN) capsule Take 1 capsule by mouth daily.   Yes [provider]  Multiple Vitamins-Minerals (PRESERVISION AREDS 2 PO) Take 1 capsule by mouth 2 (two) times daily.   Yes [provider]  nitroGLYCERIN (NITROSTAT) 0.4 MG SL tablet PLACE 1 TABLET (0.4 MG TOTAL) UNDER THE TONGUE EVERY 5 (FIVE) MINUTES AS NEEDED FOR CHEST PAIN Patient taking differently: Place 0.4 mg under the tongue every 5 (five) minutes as needed for chest pain.  05/26/19  Yes Sherran Needs, NP  Omega-3 Fatty Acids (FISH OIL) 1200 MG  CAPS Take 1,200 mg by mouth daily.    Yes [provider]  potassium chloride SA (KLOR-CON) 20 MEQ tablet Take 1 tablet (20 mEq total) by mouth 2 (two) times daily. 01/21/19  Yes Allred, Jeneen Rinks, MD  warfarin (COUMADIN) 2 MG tablet TAKE AS DIRECTED BY COUMADIN CLINIC Patient taking differently: Take 1-2 mg by mouth See admin instructions. Take 1 tablet on Tuesday, Thursday, Saturday, and Sunday and take 1/2 on Monday, Wednesday, and Friday. 04/22/19  Yes [name], MD  lidocaine-prilocaine (EMLA) cream Apply to [facility] site 1-2 hours prior to access Patient not taking: Reported on 05/28/2019 09/30/18   Joylene John D, NP  losartan (COZAAR) 50 MG tablet Take 1 tablet (50 mg total) by mouth daily. Patient not taking: Reported on 05/28/2019 03/31/19   [name], MD   Physical Exam: Vitals:   05/28/19 0315 05/28/19 0330 05/28/19 0345 05/28/19 0400  BP: (!) 176/77 (!) 179/112 (!) 184/99 (!) 155/101  Pulse: 89 100 66 (!) 58  Resp: (!) 25 (!) 31 (!) 30 20  Temp:      TempSrc:      SpO2: 91% 90% (!) 87% (!) 88%  Weight:      Height:        Wt Readings from Last 3 Encounters:  05/28/19 78.5 kg  05/26/19 79.3 kg  05/03/19 79.9 kg    . General:  Appears uncomfortable and moving frequently in bed to try to improve breathing. AAOx4.  . Eyes: EOMI, normal lids, irises & conjunctiva . ENT: grossly normal hearing, lips & tongue . Neck: normal ROM . Cardiovascular: Irregularly irregular rhythm, no m/r/g. No LE edema. . Chest Wall: Moderate tenderness to palpation over central chest. . Respiratory: CTA bilaterally, labored breathing . Abdomen: soft, ntnd . Skin: no rash or induration seen on limited exam . Musculoskeletal: grossly normal tone BUE/BLE . Psychiatric: grossly normal mood and affect, speech fluent and appropriate . Neurologic: grossly non-focal.          Labs on Admission:  Basic Metabolic Panel: Recent Labs  Lab 05/26/19 1549 05/28/19 0028  NA 142  145  K 3.9 3.8  CL 102 105  CO2 29 30  GLUCOSE 106* 118*  BUN 26* 30*  CREATININE 0.99 1.05*  CALCIUM 8.9 8.7*  MG 2.0  --  Liver Function Tests: No results for input(s): AST, ALT, ALKPHOS, BILITOT, PROT, ALBUMIN in the last 168 hours. No results for input(s): LIPASE, AMYLASE in the last 168 hours. No results for input(s): AMMONIA in the last 168 hours. CBC: Recent Labs  Lab 05/28/19 0028  WBC 5.5  HGB 13.0  HCT 40.9  MCV 100.2*  PLT 158   Cardiac Enzymes: No results for input(s): CKTOTAL, CKMB, CKMBINDEX, TROPONINI in the last 168 hours.  BNP (last 3 results) Recent Labs    10/08/18 1407 11/09/18 1041  BNP 707.8* 664.9*    ProBNP (last 3 results) Recent Labs    03/08/19 1610 03/22/19 1502  PROBNP 7,514* 3,420*    CBG: No results for input(s): GLUCAP in the last 168 hours.  Radiological Exams on Admission: DG Chest 2 View  Result Date: 05/28/2019 CLINICAL DATA:  Chest pain. EXAM: CHEST - 2 VIEW COMPARISON:  March 08, 2019 FINDINGS: The there is stable right-sided venous Port-A-Cath positioning. A dual lead AICD is noted. Mild atelectasis and/or infiltrate is seen within the retrocardiac region of the left lung base. There is no evidence of a pleural effusion or pneumothorax. The cardiac silhouette is markedly enlarged and unchanged in size. There is marked severity calcification of the aortic arch. The visualized skeletal structures are unremarkable. IMPRESSION: Mild left basilar atelectasis and/or infiltrate. Electronically Signed   By: Virgina Norfolk M.D.   On: 05/28/2019 01:43    EKG: Independently reviewed. HR 100. A fib. QTc 607. LBBB as noted on previous EKG's. No STEMI.  Assessment/Plan Principal Problem:   Chest pain Active Problems:   CAD (coronary artery disease)   Essential hypertension   Hyperlipidemia   Renal artery stenosis ([name])   Obesity (BMI 30.0-34.9)   Granulosa cell tumor of ovary   Tachycardia-bradycardia syndrome ([name])    Combined systolic and diastolic heart failure, NYHA class 3 ([name])   Chronic anticoagulation - coumadin, CHADS2VASC=6   Status post coronary artery stent placement   Atrial fibrillation, chronic ([place])  86 y.o. female with PMH CAD s/p multiple stents, chronic combined CHF, permanent atrial fib/flutter on Coumadin with tachy-brady syndrome s/p leadless PPM (with premature battery failure no longer active - being followed conservatively for now), probable CKD III per labs, granulosa cell carcinoma, HTN, HLD, LBBB, varicose veins, renal artery stenosis, PVD, diverticulitis, OSA who presented to ED with chest pain and admitted for ACS rule-out.  Chest Pain ACS Rule-out CAD hx CHA2DS2VASc score of at least 6 - recurrent central chest pain improved with Nitro in setting of extensive CAD hx - Heart Score at least 5 - Trop 53>61; will trend - Re-consult Cards PRN - Tele - NPO for now - Nitro PRN - Continue wafarin, Plavix  - Daily INR  New O2 requirement - CXR without evidence of infection, WBC WNL, afebrile, no cough - Procal ordered - Albuterol - patient reports baseline DOE but worse tonight - BNP ordered  - Incentive spirometry   HTN - cont PTA Toprol XL, Losartan  Atrial fib/atrial flutter  Prolonged QTc - Seen by Cards 4/22: "Out of rhythm today and is unaware" - Hold dofetilide in setting of prolonged QTc; QTc 607 on admission  - Continue metoprolol xl 200 mg a day  - Daily Mag, K  CHF - Continue daily weights, weight stable at home per pt  - Strict I's and O's - Last Echo Oct 2020: EF 40-45% - Cont PTA Lasix, Imdur   CKD Stage 3 - Cr 1.05 and GFR  48 on admission; stable  Progressive granulosa  - Per Dr. Alvy Bimler  - Referred to palliative care   Code Status: DNR. Declines chest compressions and intubation. Okay with NIV, pressors, ICU-level care.  DVT Prophylaxis: PTA Warfarin Family Communication: None Disposition Plan: Admit under observation. Patient requiring  further observation for chest pain with lab and Tele monitoring. Anticipate discharge home 4/25 if chest pain resolves and labs stable.  Time spent: 70 minutes  Chauncey Mann, MD [facility] Pager [phone]

## 2019-05-28 NOTE — Consult Note (Signed)
Cardiology Consultation:   Patient ID: Patty [name] MRN: [id number]; DOB: 06-17-1930  Admit date: 05/28/2019 Date of Consult: 05/28/2019  Primary Care Provider: [name], MD Primary Cardiologist: Larae Grooms, MD  Primary Electrophysiologist:  Thompson Grayer, MD    Patient Profile:   Patty [name] is a 86 y.o. female with a hx of h/o permanent afib, CAD, HTN, HLD, LBBB, tachy-brady with w/ PPM, OSA who is being seen today for the evaluation of elevated troponin at the request of Dr. Verlon Au.  History of Present Illness:   Patty [name] is an 86 year old female with a complex past medical history including coronary artery disease status post multiple prior interventions, chronic combined CHF, permanent atrial fibrillation/flutter on warfarin, tachycardia-bradycardia syndrome status post leadless permanent pacemaker with premature battery failure, closely observed and conservatively managed, granulosis cell carcinoma, hypertension, hyperlipidemia, chronic left bundle branch block, renal artery stenosis, OSA, CKD.  We have been asked to see the patient for shortness of breath and elevated troponin with chest pain.  Yesterday she felt cold and sweaty and took a nitroglycerin and subsequently woke up with chest pain and took another nitroglycerin.  After requiring a third nitroglycerin she called EMS.  She was noted to be in atrial fibrillation.  She endorsed central chest pain.  She is quite short of breath with increased oxygen requirement.  Troponin was noted to be mildly elevated from 53-98, and BNP of 1018.  She is being diuresed by primary service.  She is quite concerned about missing a dose of Tikosyn since she spent a very long time in the emergency department.  ECG demonstrates atrial flutter with interventricular conduction delay and QRS duration 162 ms with QTC of 460 ms.  No ischemic changes noted.  She has recently discussed with her oncologist that chemotherapy will be  stopped and a palliative approach will be pursued.  That being said she is in favor of cardiac procedures as needed and has had experience with coronary angiography before having received multiple interventions.  She feels she needs to keep her heart strong and do what is needed.   Past Medical History:  Diagnosis Date  . Bleeding behind the abdominal cavity 10/2017  . Chronic anticoagulation - coumadin, CHADS2VASC=6 05/17/2015  . CKD (chronic kidney disease), stage III   . Combined systolic and diastolic heart failure ([place])   . Coronary artery disease    a. s/p multiple stents - stenting x 2 to the LAD and x 1 to the LCx in 2000, rotational arthrectomy to proximal LCx and DES to LAD in 2014 and DES to LCx in 2014, along with DES to LAD for re-in-stent stenosis in 2015, and DES to ostial LAD in 2016. b. 07/2016 - orbital atherectomy & DES to mid LCx.  . Diverticulitis   . Granulosa cell carcinoma ([place])    abd; last episode was in 2009  . Hyperlipidemia   . Hypertension   . LBBB (left bundle branch block)   . Myocardial infarction ([place]) 2002  . Obesity   . OSA on CPAP   . Ovarian ca ([place]) 2019  . Pacemaker failure    a. Prior leadless PPM with premature battery failure, being managed conservatively without replacement.  . Peripheral vascular disease ([place])   . Permanent atrial fibrillation ([place]) 2013  . Renal artery stenosis ([place])   . Tachycardia-bradycardia syndrome ([place])    a. s/p leadless pacemaker ([name]) implanted by Dr [name]  . Varicose veins     Past Surgical History:  Procedure Laterality Date  . ABDOMINAL HYSTERECTOMY    . CARDIAC CATHETERIZATION  09/03/2007   EF 70%; Failed attempt at PCI to OM  . CARDIAC CATHETERIZATION  11/01/2003   EF 70%  . CARDIAC CATHETERIZATION N/A 12/14/2015   Procedure: Left Heart Cath and Coronary Angiography;  Surgeon: Burnell Blanks, MD;  Location: [place] CV LAB;  Service: Cardiovascular;  Laterality: N/A;  . CARDIOVERSION   12/31/2011   Procedure: CARDIOVERSION;  Surgeon: [name], MD;  Location: [facility] ENDOSCOPY;  Service: Cardiovascular;  Laterality: N/A;  . CARDIOVERSION N/A 12/31/2011   Procedure: CARDIOVERSION;  Surgeon: [name], MD;  Location: [facility] CATH LAB;  Service: Cardiovascular;  Laterality: N/A;  . CARDIOVERSION N/A 11/15/2018   Procedure: CARDIOVERSION;  Surgeon: Dorothy Spark, MD;  Location: [place];  Service: Cardiovascular;  Laterality: N/A;  . CATARACT EXTRACTION, BILATERAL  2015  . CHOLECYSTECTOMY  1980's  . COLON SURGERY  2004   colectomy for diverticulosis  . CORONARY ANGIOPLASTY WITH STENT PLACEMENT  2000    and 08/11/2012; 11/12/2012: 3 + 2 LAD & CFX; 2nd CFX stent 11/12/2012  . CORONARY ATHERECTOMY N/A 07/15/2016   Procedure: Coronary Atherectomy;  Surgeon: Martinique, Peter M, MD;  Location: [place] CV LAB;  Service: Cardiovascular;  Laterality: N/A;  . CORONARY BALLOON ANGIOPLASTY N/A 07/14/2016   Procedure: Coronary Balloon Angioplasty;  Surgeon: Martinique, Peter M, MD;  Location: [place] CV LAB;  Service: Cardiovascular;  Laterality: N/A;  . CORONARY STENT INTERVENTION N/A 07/15/2016   Procedure: Coronary Stent Intervention;  Surgeon: Martinique, Peter M, MD;  Location: [place] CV LAB;  Service: Cardiovascular;  Laterality: N/A;  . FRACTIONAL FLOW RESERVE WIRE  10/07/2013   Procedure: [place];  Surgeon: [name], MD;  Location: [facility] CATH LAB;  Service: Cardiovascular;;  . HERNIA REPAIR  2005   "laparoscopic"  . IR IMAGING GUIDED PORT INSERTION  10/28/2017  . LEFT HEART CATH AND CORONARY ANGIOGRAPHY N/A 07/14/2016   Procedure: Left Heart Cath and Coronary Angiography;  Surgeon: Martinique, Peter M, MD;  Location: [place] CV LAB;  Service: Cardiovascular;  Laterality: N/A;  . LEFT HEART CATH AND CORONARY ANGIOGRAPHY N/A 11/10/2018   Procedure: LEFT HEART CATH AND CORONARY ANGIOGRAPHY;  Surgeon: Wellington Hampshire, MD;  Location: [place] CV LAB;  Service: Cardiovascular;  Laterality: N/A;  . LEFT HEART CATHETERIZATION WITH CORONARY ANGIOGRAM N/A 11/12/2012   Procedure: LEFT HEART CATHETERIZATION WITH CORONARY ANGIOGRAM;  Surgeon: [name], MD;  Location: [facility] CATH LAB;  Service: Cardiovascular;  Laterality: N/A;  . LEFT HEART CATHETERIZATION WITH CORONARY ANGIOGRAM N/A 10/07/2013   Procedure: LEFT HEART CATHETERIZATION WITH CORONARY ANGIOGRAM;  Surgeon: [name], MD;  Location: [facility] CATH LAB;  Service: Cardiovascular;  Laterality: N/A;  . LEFT HEART CATHETERIZATION WITH CORONARY ANGIOGRAM N/A 12/14/2013   Procedure: LEFT HEART CATHETERIZATION WITH CORONARY ANGIOGRAM;  Surgeon: Sinclair Grooms, MD;  Location: [facility] CATH LAB;  Service: Cardiovascular;  Laterality: N/A;  . LEFT HEART CATHETERIZATION WITH CORONARY ANGIOGRAM N/A 05/16/2014   Procedure: LEFT HEART CATHETERIZATION WITH CORONARY ANGIOGRAM;  Surgeon: Sherren Mocha, MD;  Location: [facility] CATH LAB;  Service: Cardiovascular;  Laterality: N/A;  . PERCUTANEOUS CORONARY INTERVENTION-BALLOON ONLY  08/04/2012   Procedure: PERCUTANEOUS CORONARY INTERVENTION-BALLOON ONLY;  Surgeon: [name], MD;  Location: [facility] CATH LAB;  Service: Cardiovascular;;  . PERCUTANEOUS CORONARY ROTOBLATOR INTERVENTION (PCI-R) N/A 08/11/2012   Procedure: PERCUTANEOUS CORONARY ROTOBLATOR INTERVENTION (PCI-R);  Surgeon: Charlann Lange  Irish Lack, MD;  Location: [facility] CATH LAB;  Service: Cardiovascular;  Laterality: N/A;  . PERCUTANEOUS CORONARY STENT INTERVENTION (PCI-S)  10/07/2013   Procedure: PERCUTANEOUS CORONARY STENT INTERVENTION (PCI-S);  Surgeon: [name], MD;  Location: [facility] CATH LAB;  Service: Cardiovascular;;  . PERMANENT PACEMAKER INSERTION N/A 03/16/2012   [name] (SJM) leadless pacemaker (LEADLESS II STUDY PATEINT)  . SALIVARY GLAND SURGERY  2000's   "had a little lump removed; granulosa related; it was benign" (08/11/2012)  . TEE WITHOUT CARDIOVERSION  12/31/2011    Procedure: TRANSESOPHAGEAL ECHOCARDIOGRAM (TEE);  Surgeon: [name], MD;  Location: [facility] ENDOSCOPY;  Service: Cardiovascular;  Laterality: N/A;  . UMBILICAL GRANULOMA EXCISION  2000   2003; 2004; 2007: "all in my abdomen including small intestines, outside my ?uterus/etc" (08/11/2012)  . VARICOSE VEIN SURGERY Bilateral 1977     Home Medications:  Prior to Admission medications   Medication Sig Start Date End Date Taking? Authorizing Provider  acetaminophen (TYLENOL) 500 MG tablet Take 500 mg by mouth every 6 (six) hours as needed for mild pain or headache.    Yes [provider]  atorvastatin (LIPITOR) 80 MG tablet TAKE 1 TABLET BY MOUTH EVERY DAY IN THE MORNING Patient taking differently: Take 80 mg by mouth daily.  04/04/19  Yes [name], MD  beta carotene w/minerals (OCUVITE) tablet Take 1 tablet by mouth 2 (two) times daily.    Yes [provider]  Calcium Carb-Cholecalciferol (CALTRATE 600+D) 600-800 MG-UNIT TABS Take 1 tablet by mouth every evening.    Yes [provider]  clopidogrel (PLAVIX) 75 MG tablet TAKE 1 TABLET BY MOUTH EVERY DAY Patient taking differently: Take 75 mg by mouth daily.  03/15/19  Yes [name], MD  Coenzyme Q-10 100 MG capsule Take 200 mg by mouth at bedtime.    Yes [provider]  docusate sodium (COLACE) 100 MG capsule Take 100 mg by mouth daily as needed for mild constipation (If no BM after 2 days.).    Yes [provider]  dofetilide (TIKOSYN) 125 MCG capsule Take 1 capsule (125 mcg total) by mouth 2 (two) times daily. 12/16/18  Yes Sherran Needs, NP  furosemide (LASIX) 40 MG tablet Take 2 tablets by mouth every morning and 2 additional tabs as needed for wt gain/abdominal distension Patient taking differently: Take 40 mg by mouth See admin instructions. Take 2 tablets by mouth every morning and 2 additional tabs as needed for wt gain/abdominal distension  03/30/19  Yes [name], MD  gabapentin (NEURONTIN) 300 MG capsule TAKE 1 CAPSULE BY MOUTH TWICE A DAY Patient taking differently: Take 300 mg by mouth 2 (two) times daily.  04/25/19  Yes Gorsuch, Ni, MD  isosorbide mononitrate (IMDUR) 60 MG 24 hr tablet TAKE 1 TABLET BY MOUTH EVERY DAY Patient taking differently: Take 60 mg by mouth daily.  02/08/19  Yes [name], MD  losartan (COZAAR) 50 MG tablet Take 1 tablet (50 mg total) by mouth daily. 04/22/19  Yes Sherran Needs, NP  metoprolol (TOPROL-XL) 200 MG 24 hr tablet Take 1 tablet (200 mg total) by mouth daily. Take with or immediately following a meal. 12/10/18  Yes Sherran Needs, NP  Multiple Vitamin (MULTIVITAMIN) capsule Take 1 capsule by mouth daily.   Yes [provider]  Multiple Vitamins-Minerals (PRESERVISION AREDS 2 PO) Take 1 capsule by mouth 2 (two) times daily.   Yes [provider]  nitroGLYCERIN (NITROSTAT) 0.4 MG SL tablet  PLACE 1 TABLET (0.4 MG TOTAL) UNDER THE TONGUE EVERY 5 (FIVE) MINUTES AS NEEDED FOR CHEST PAIN Patient taking differently: Place 0.4 mg under the tongue every 5 (five) minutes as needed for chest pain.  05/26/19  Yes Sherran Needs, NP  Omega-3 Fatty Acids (FISH OIL) 1200 MG CAPS Take 1,200 mg by mouth daily.    Yes [provider]  potassium chloride SA (KLOR-CON) 20 MEQ tablet Take 1 tablet (20 mEq total) by mouth 2 (two) times daily. 01/21/19  Yes Allred, Jeneen Rinks, MD  warfarin (COUMADIN) 2 MG tablet TAKE AS DIRECTED BY COUMADIN CLINIC Patient taking differently: Take 1-2 mg by mouth See admin instructions. Take 1 tablet on Tuesday, Thursday, Saturday, and Sunday and take 1/2 on Monday, Wednesday, and Friday. 04/22/19  Yes [name], MD  lidocaine-prilocaine (EMLA) cream Apply to [facility] site 1-2 hours prior to access Patient not taking: Reported on 05/28/2019 09/30/18   Joylene John D, NP  losartan (COZAAR) 50 MG tablet Take 1 tablet (50 mg total) by mouth daily. Patient not taking:  Reported on 05/28/2019 03/31/19   [name], MD    Inpatient Medications: Scheduled Meds: . atorvastatin  80 mg Oral Daily  . clopidogrel  75 mg Oral Daily  . furosemide  40 mg Intravenous Q12H  . gabapentin  300 mg Oral BID  . isosorbide mononitrate  60 mg Oral Daily  . lidocaine  1 patch Transdermal Q24H  . losartan  50 mg Oral Daily  . metoprolol  200 mg Oral Daily  . warfarin  2 mg Oral Q T,Th,S,Su-1800   And  . [START ON 05/30/2019] warfarin  1 mg Oral Q M,W,F-1800  . Warfarin - Physician Dosing Inpatient   Does not apply q1600   Continuous Infusions:  PRN Meds: acetaminophen, albuterol, nitroGLYCERIN, ondansetron (ZOFRAN) IV  Allergies:    Allergies  Allergen Reactions  . Bee Venom Anaphylaxis  . Other Nausea And Vomiting and Other (See Comments)    Pain medications cause severe vomiting. Tolerated slow IV morphine drip  . Oxycodone Hcl Nausea And Vomiting  . Amiodarone Nausea Only  . Prednisone Palpitations and Other (See Comments)    "Rapid Heart Beat"    Social History:   Social History   Socioeconomic History  . Marital status: Widowed    Spouse name: Not on file  . Number of children: 4  . Years of education: Not on file  . Highest education level: Not on file  Occupational History  . Occupation: Retired Nurse, learning disability estate  Tobacco Use  . Smoking status: Former Smoker    Packs/day: 1.00    Years: 32.00    Pack years: 32.00    Types: Cigarettes    Quit date: 02/03/1974    Years since quitting: 45.3  . Smokeless tobacco: Never Used  Substance and Sexual Activity  . Alcohol use: Yes    Alcohol/week: 7.0 standard drinks    Types: 7 Glasses of wine per week    Comment: one glass wine nightly with dinner  . Drug use: No  . Sexual activity: Never  Other Topics Concern  . Not on file  Social History Narrative   Lives alone.   Social Determinants of Health   Financial Resource Strain:   . Difficulty of Paying Living Expenses:   Food  Insecurity:   . Worried About Charity fundraiser in the Last Year:   . [place] in the Last Year:  Transportation Needs:   . Film/video editor (Medical):   Marland Kitchen Lack of Transportation (Non-Medical):   Physical Activity:   . Days of Exercise per Week:   . Minutes of Exercise per Session:   Stress:   . Feeling of Stress :   Social Connections:   . Frequency of Communication with Friends and Family:   . Frequency of Social Gatherings with Friends and Family:   . Attends Religious Services:   . Active Member of Clubs or Organizations:   . Attends Archivist Meetings:   Marland Kitchen Marital Status:   Intimate Partner Violence:   . Fear of Current or Ex-Partner:   . Emotionally Abused:   Marland Kitchen Physically Abused:   . Sexually Abused:     Family History:    Family History  Problem Relation Age of Onset  . Stroke Mother   . Heart attack Father   . Diabetes Father   . Hypertension Father   . Heart attack Brother   . Diabetes Brother   . Hypertension Brother   . Kidney failure Brother      ROS:  Please see the history of present illness.   All other ROS reviewed and negative.     Physical Exam/Data:   Vitals:   05/28/19 1322 05/28/19 1414 05/28/19 1454 05/28/19 1501  BP:  108/72 (!) 126/101 127/77  Pulse: (!) 25 (!) 113 (!) 112 (!) 55  Resp: (!) 30 20 18 18   Temp:  97.9 F (36.6 C) (!) 97.3 F (36.3 C) (!) 97.5 F (36.4 C)  TempSrc:  Oral Oral Oral  SpO2: 98% 97% 97% 98%  Weight:      Height:        Intake/Output Summary (Last 24 hours) at 05/28/2019 1641 Last data filed at 05/28/2019 1425 Gross per 24 hour  Intake --  Output 800 ml  Net -800 ml   Last 3 Weights 05/28/2019 05/26/2019 05/03/2019  Weight (lbs) 173 lb 174 lb 12.8 oz 176 lb 3.2 oz  Weight (kg) 78.472 kg 79.289 kg 79.924 kg     Body mass index is 33.79 kg/m.  General:  NAD HEENT: normal Neck: challenging to assess Cardiac:  normal S1, S2; [id number] systolic murmur, no rubs or gallops Lungs:   clear to auscultation bilaterally, no wheezing, rhonchi or rales  Abd: soft, nontender, no hepatomegaly  Ext: no edema Musculoskeletal:  No deformities, BUE and BLE strength normal and equal Skin: warm and dry  Neuro:  CNs 2-12 intact, no focal abnormalities noted Psych:  Normal affect   EKG:  The EKG was personally reviewed and demonstrates:  As above Telemetry:  Telemetry was personally reviewed and demonstrates: IVCD, atrial flutter  Relevant CV Studies: Echo pending.   Laboratory Data:  High Sensitivity Troponin:   Recent Labs  Lab 05/28/19 0028 05/28/19 0228 05/28/19 0453 05/28/19 0650  TROPONINIHS 53* 61* 73* 98*     Chemistry Recent Labs  Lab 05/26/19 1549 05/28/19 0028  NA 142 145  K 3.9 3.8  CL 102 105  CO2 29 30  GLUCOSE 106* 118*  BUN 26* 30*  CREATININE 0.99 1.05*  CALCIUM 8.9 8.7*  GFRNONAA 52* 48*  GFRAA 60* 56*  ANIONGAP 11 10    No results for input(s): PROT, ALBUMIN, AST, ALT, ALKPHOS, BILITOT in the last 168 hours. Hematology Recent Labs  Lab 05/28/19 0028  WBC 5.5  RBC 4.08  HGB 13.0  HCT 40.9  MCV 100.2*  MCH 31.9  MCHC 31.8  RDW 15.0  PLT 158   BNP Recent Labs  Lab 05/28/19 0453  BNP 1,018.0*    DDimer No results for input(s): DDIMER in the last 168 hours.   Radiology/Studies:  DG Chest 2 View  Result Date: 05/28/2019 CLINICAL DATA:  Chest pain. EXAM: CHEST - 2 VIEW COMPARISON:  March 08, 2019 FINDINGS: The there is stable right-sided venous Port-A-Cath positioning. A dual lead AICD is noted. Mild atelectasis and/or infiltrate is seen within the retrocardiac region of the left lung base. There is no evidence of a pleural effusion or pneumothorax. The cardiac silhouette is markedly enlarged and unchanged in size. There is marked severity calcification of the aortic arch. The visualized skeletal structures are unremarkable. IMPRESSION: Mild left basilar atelectasis and/or infiltrate. Electronically Signed   By: Virgina Norfolk M.D.   On: 05/28/2019 01:43      Assessment and Plan:   CAD status post prior PCI with stable disease in October 2020 -Mild elevation in troponin with rate controlled atrial flutter.  This could represent demand ischemia versus true ischemia.  Troponins fortunately are mildly elevated.  She has known disease that is stable since October.  It may be reasonable to perform an echocardiogram to evaluate for any drop in ejection fraction as compared to October 2020, EF was 40 to 45% at that time.  If new wall motion abnormalities are detected or EF has dropped significantly, could consider repeat angiography on Monday.  Continue atorvastatin, clopidogrel For chest pain I would recommend continuing her Imdur, this can be uptitrated given that she takes sublingual nitroglycerin.  Would increase dose to 90 mg daily  Heart failure with reduced ejection fraction-she is being diuresed with Lasix 40 mg IV twice daily, continues to require oxygen.  Would continue IV diuresis at this time and monitor I's and O's strictly.  May need potassium supplementation as she takes this at home.  Atrial fibrillation/atrial flutter, permanent-patient is continued on Tikosyn and metoprolol per A. fib clinic where she was recently seen.  Continue these medications in hospital.  Continue warfarin, INR 1.9, adjustments per pharmacy recommended.  Hypertension-continue losartan, metoprolol  Hyperlipidemia-continue atorvastatin.  For questions or updates, please contact [place] Please consult [email or web address] for contact info under     Signed, Elouise Munroe, MD  05/28/2019 4:41 PM

## 2019-05-28 NOTE — ED Triage Notes (Signed)
Pt arrives via [facility] stretcher. C/O CP that began after taking nightime meds. Pt reportedly took home nitro at 2115 and went to bed. Was re-awoken at approx 2300 with 8/10 CP and took second nitro and called 911. Pt has hx of A-fib and feels like she is currently in a-fib, EMS EKG confirms. Pt was given third nitro by EMS with relief. Initial BP was 216/110 before and 143/78 after nitro. Pt A+Ox4 in room, denies CP currently

## 2019-05-28 NOTE — ED Notes (Signed)
RN attempted to call report to 3E. This RN will call back in 54mins

## 2019-05-29 ENCOUNTER — Encounter (HOSPITAL_COMMUNITY): Payer: Self-pay | Admitting: Family Medicine

## 2019-05-29 ENCOUNTER — Observation Stay (HOSPITAL_COMMUNITY): Payer: Medicare Other

## 2019-05-29 DIAGNOSIS — R0602 Shortness of breath: Secondary | ICD-10-CM | POA: Diagnosis not present

## 2019-05-29 DIAGNOSIS — E873 Alkalosis: Secondary | ICD-10-CM | POA: Diagnosis not present

## 2019-05-29 DIAGNOSIS — E669 Obesity, unspecified: Secondary | ICD-10-CM | POA: Diagnosis present

## 2019-05-29 DIAGNOSIS — I252 Old myocardial infarction: Secondary | ICD-10-CM | POA: Diagnosis not present

## 2019-05-29 DIAGNOSIS — I2510 Atherosclerotic heart disease of native coronary artery without angina pectoris: Secondary | ICD-10-CM | POA: Diagnosis not present

## 2019-05-29 DIAGNOSIS — Z20822 Contact with and (suspected) exposure to covid-19: Secondary | ICD-10-CM | POA: Diagnosis not present

## 2019-05-29 DIAGNOSIS — I130 Hypertensive heart and chronic kidney disease with heart failure and stage 1 through stage 4 chronic kidney disease, or unspecified chronic kidney disease: Secondary | ICD-10-CM | POA: Diagnosis not present

## 2019-05-29 DIAGNOSIS — Z66 Do not resuscitate: Secondary | ICD-10-CM | POA: Diagnosis not present

## 2019-05-29 DIAGNOSIS — Z9221 Personal history of antineoplastic chemotherapy: Secondary | ICD-10-CM | POA: Diagnosis not present

## 2019-05-29 DIAGNOSIS — E785 Hyperlipidemia, unspecified: Secondary | ICD-10-CM | POA: Diagnosis not present

## 2019-05-29 DIAGNOSIS — I351 Nonrheumatic aortic (valve) insufficiency: Secondary | ICD-10-CM | POA: Diagnosis not present

## 2019-05-29 DIAGNOSIS — Z955 Presence of coronary angioplasty implant and graft: Secondary | ICD-10-CM | POA: Diagnosis not present

## 2019-05-29 DIAGNOSIS — R079 Chest pain, unspecified: Secondary | ICD-10-CM | POA: Diagnosis not present

## 2019-05-29 DIAGNOSIS — Z8543 Personal history of malignant neoplasm of ovary: Secondary | ICD-10-CM | POA: Diagnosis not present

## 2019-05-29 DIAGNOSIS — Z950 Presence of cardiac pacemaker: Secondary | ICD-10-CM | POA: Diagnosis not present

## 2019-05-29 DIAGNOSIS — I495 Sick sinus syndrome: Secondary | ICD-10-CM | POA: Diagnosis present

## 2019-05-29 DIAGNOSIS — G4733 Obstructive sleep apnea (adult) (pediatric): Secondary | ICD-10-CM | POA: Diagnosis present

## 2019-05-29 DIAGNOSIS — Z833 Family history of diabetes mellitus: Secondary | ICD-10-CM | POA: Diagnosis not present

## 2019-05-29 DIAGNOSIS — I447 Left bundle-branch block, unspecified: Secondary | ICD-10-CM | POA: Diagnosis not present

## 2019-05-29 DIAGNOSIS — I739 Peripheral vascular disease, unspecified: Secondary | ICD-10-CM | POA: Diagnosis present

## 2019-05-29 DIAGNOSIS — R0789 Other chest pain: Secondary | ICD-10-CM | POA: Diagnosis not present

## 2019-05-29 DIAGNOSIS — I509 Heart failure, unspecified: Secondary | ICD-10-CM

## 2019-05-29 DIAGNOSIS — Z6833 Body mass index (BMI) 33.0-33.9, adult: Secondary | ICD-10-CM | POA: Diagnosis not present

## 2019-05-29 DIAGNOSIS — I4821 Permanent atrial fibrillation: Secondary | ICD-10-CM | POA: Diagnosis not present

## 2019-05-29 DIAGNOSIS — Z8249 Family history of ischemic heart disease and other diseases of the circulatory system: Secondary | ICD-10-CM | POA: Diagnosis not present

## 2019-05-29 DIAGNOSIS — N183 Chronic kidney disease, stage 3 unspecified: Secondary | ICD-10-CM | POA: Diagnosis not present

## 2019-05-29 DIAGNOSIS — I5043 Acute on chronic combined systolic (congestive) and diastolic (congestive) heart failure: Secondary | ICD-10-CM | POA: Diagnosis not present

## 2019-05-29 DIAGNOSIS — I701 Atherosclerosis of renal artery: Secondary | ICD-10-CM | POA: Diagnosis present

## 2019-05-29 DIAGNOSIS — I4892 Unspecified atrial flutter: Secondary | ICD-10-CM | POA: Diagnosis not present

## 2019-05-29 LAB — CBC WITH DIFFERENTIAL/PLATELET
Abs Immature Granulocytes: 0.01 10*3/uL (ref 0.00–0.07)
Basophils Absolute: 0 10*3/uL (ref 0.0–0.1)
Basophils Relative: 1 %
Eosinophils Absolute: 0.2 10*3/uL (ref 0.0–0.5)
Eosinophils Relative: 4 %
HCT: 40.3 % (ref 36.0–46.0)
Hemoglobin: 12.8 g/dL (ref 12.0–15.0)
Immature Granulocytes: 0 %
Lymphocytes Relative: 28 %
Lymphs Abs: 1.6 10*3/uL (ref 0.7–4.0)
MCH: 32.3 pg (ref 26.0–34.0)
MCHC: 31.8 g/dL (ref 30.0–36.0)
MCV: 101.8 fL — ABNORMAL HIGH (ref 80.0–100.0)
Monocytes Absolute: 0.5 10*3/uL (ref 0.1–1.0)
Monocytes Relative: 9 %
Neutro Abs: 3.3 10*3/uL (ref 1.7–7.7)
Neutrophils Relative %: 58 %
Platelets: 157 10*3/uL (ref 150–400)
RBC: 3.96 MIL/uL (ref 3.87–5.11)
RDW: 15.2 % (ref 11.5–15.5)
WBC: 5.7 10*3/uL (ref 4.0–10.5)
nRBC: 0 % (ref 0.0–0.2)

## 2019-05-29 LAB — COMPREHENSIVE METABOLIC PANEL
ALT: 31 U/L (ref 0–44)
AST: 41 U/L (ref 15–41)
Albumin: 2.5 g/dL — ABNORMAL LOW (ref 3.5–5.0)
Alkaline Phosphatase: 49 U/L (ref 38–126)
Anion gap: 9 (ref 5–15)
BUN: 30 mg/dL — ABNORMAL HIGH (ref 8–23)
CO2: 29 mmol/L (ref 22–32)
Calcium: 8.4 mg/dL — ABNORMAL LOW (ref 8.9–10.3)
Chloride: 108 mmol/L (ref 98–111)
Creatinine, Ser: 1.14 mg/dL — ABNORMAL HIGH (ref 0.44–1.00)
GFR calc Af Amer: 50 mL/min — ABNORMAL LOW (ref >60)
GFR calc non Af Amer: 44 mL/min — ABNORMAL LOW (ref >60)
Glucose, Bld: 102 mg/dL — ABNORMAL HIGH (ref 70–99)
Potassium: 4.5 mmol/L (ref 3.5–5.1)
Sodium: 146 mmol/L — ABNORMAL HIGH (ref 135–145)
Total Bilirubin: 1.2 mg/dL (ref 0.3–1.2)
Total Protein: 5.5 g/dL — ABNORMAL LOW (ref 6.5–8.1)

## 2019-05-29 LAB — ECHOCARDIOGRAM LIMITED
Height: 60 in
Weight: 2771.2 [oz_av]

## 2019-05-29 LAB — MAGNESIUM: Magnesium: 2.2 mg/dL (ref 1.7–2.4)

## 2019-05-29 LAB — PROTIME-INR
INR: 2.2 — ABNORMAL HIGH (ref 0.8–1.2)
Prothrombin Time: 24.7 seconds — ABNORMAL HIGH (ref 11.4–15.2)

## 2019-05-29 LAB — PROCALCITONIN: Procalcitonin: <0.1 ng/mL

## 2019-05-29 MED ORDER — WARFARIN SODIUM 2 MG PO TABS
2.0000 mg | ORAL_TABLET | Freq: Once | ORAL | Status: AC
  Administered 2019-05-29: 17:00:00 2 mg via ORAL
  Filled 2019-05-29: qty 1

## 2019-05-29 MED ORDER — FUROSEMIDE 40 MG PO TABS
40.0000 mg | ORAL_TABLET | Freq: Two times a day (BID) | ORAL | Status: DC
  Administered 2019-05-29 – 2019-05-31 (×4): 40 mg via ORAL
  Filled 2019-05-29 (×4): qty 1

## 2019-05-29 MED ORDER — PERFLUTREN LIPID MICROSPHERE
1.0000 mL | INTRAVENOUS | Status: AC | PRN
  Administered 2019-05-29: 09:00:00 4 mL via INTRAVENOUS
  Filled 2019-05-29: qty 10

## 2019-05-29 NOTE — Progress Notes (Signed)
Patient QTc is 534 this am Cardioligy PA notified, PA okay to give Tikosyn. Will continue to monitor patient.

## 2019-05-29 NOTE — Progress Notes (Signed)
PROGRESS NOTE    Patty [name]  [id number] DOB: 10-Mar-1930 DOA: 05/28/2019 PCP: [name], MD    Chief Complaint  Patient presents with  . Chest Pain    Brief Narrative:  28 white female tachybradycardia syndrome + leadless PPM-?  Battery failure CAD/stents Granulosa cell tumor of the ovary-under care of Dr. Simeon Craft such-now under consideration for focus on palliative and comfort at end-of-life per last office visit note 05/03/2019 CKD 3/3B with LBBB RAS PVD OSA  It appears was admitted 10 6-10/13 with AECHF persistent A. fib-cardiac cath 11/10/2018 with underlying three-vessel disease and a severely reduced LV systolic function [id number] was also cardioverted on 11/15/2022 persistent atrial fibrillation   Patient was seen at office visit with Roderic Palau and was out of rhythm she was feeling well and was told to continue dofetilide 125 mcg twice daily and metoprolol XL 200 daily it is noted in that note that she used nitroglycerin as well recently  She presents back as per my partner with chest pain responsive to nitroglycerin found to be in A. fib took a couple more nitro- Troponin was 53-->61 Patient is compliant on her medications typically-is on goal-directed therapy ARB Imdur metoprolol Also appears to be on Tikosyn  EDP spoke to cardiology who recommended to observe trend and reconsult as as needed Troponin has risen to 98 Her BNP is 1018 her magnesium is 1.9 Her INR is 1.9 as well Procalcitonin 1.3 arguing more likely against infection but unclear CXR?  Infiltrate  Morning EKG shows LBBB by [place] criteria I am not able to tell any depressions V5 V1 through 3-compared to prior EKG performed 02/24/2019 ST-T wave and rate are changed but no specific other changes that I can discern  She tells me when I talked her that she does not typically have chest pain she normally takes nitroglycerin for sometimes relaxation to allow her to sleep She took the  nitroglycerin last night at 9 PM and then felt worse and awoke at 11 PM took some more and decided to come to the hospital at the urging of her children who are in town  2/2 increasing dyspnea overnight and was changed to 15 L-patient does not use oxygen at home but uses CPAP at night I was able to down titrate to 4 L but she is still winded and unable to complete sentences   She relates that she is normally has some amount of chest pain but is able to typically self manage with her chronic diseases at home-last night was unusual in that she had severe symptoms   Assessment & Plan:   Principal Problem:   Chest pain Active Problems:   CAD (coronary artery disease)   Essential hypertension   Hyperlipidemia   Renal artery stenosis ([name])   Obesity (BMI 30.0-34.9)   Granulosa cell tumor of ovary   Tachycardia-bradycardia syndrome ([name])   Combined systolic and diastolic heart failure, NYHA class 3 ([name])   Chronic anticoagulation - coumadin, CHADS2VASC=6   Status post coronary artery stent placement   Atrial fibrillation, chronic ([name])   Multifactorial dyspnea  Likely cause is decompensated right-sided heart failure-has a history of baseline NYHA class III dyspnea differential diagnosis right-sided pneumonia  Patient tells me PCP was treating her for 2 to 3 days in March with an antibiotic but she only took 1 or 2 days of  procalcitonin is less than 0.10, repeat CXR a.m. 4/26-if both are still suggestive circumscribed course of doxycycline for 5 days  sats  on RA 93%  Likely decompensated systolic heart failure  Change IV Lasix to p.o. Lasix 4/26  Current medsCozaar 50, metoprolol 200 XL, Imdur 90 (this is an increase)  EF 40-45% moderately decreased function per cardiology this represents no change-no further work-up anticipated?  Tachybradycardia syndrome QTC 540 at times-leadless pacemaker placement in the past-chronic LBBB  QTC can go up to 600-continue dofetilide and  other meds-rest as per cardiology-magnesium is 2.2  Continue anticoagulation with Coumadin  CKD 3  Care with diuresis monitor trends of creatinine on a.m.  OSA on CPAP  Depending on echo and RSVP may need titration-not sure how aggressive she would want to be in terms of cath versus not-resume CPAP tonight with home settings  RAS  Continue Plavix 75 daily   DVT prophylaxis: On Coumadin Code Status: DNR Family Communication: No family present at the bedside Disposition: Inpatient  Status is: Observation  The patient will require care spanning > 2 midnights and should be moved to inpatient because: Hemodynamically unstable  Dispo: The patient is from: Home              Anticipated d/c is to: Home              Anticipated d/c date is: 1 day              Patient currently is not medically stable to d/c.   Consultants:   Cardiology  Procedures: Echocardiogram  Antimicrobials: None yet   Subjective: Can breathe without struggle. No cp-appetite is good--slept also very well last pm Has purewick Feels less anxious today   Objective: Vitals:   05/28/19 1501 05/28/19 2054 05/29/19 0533 05/29/19 0948  BP: 127/77 (!) 134/48 (!) 122/56 (!) 120/48  Pulse: (!) 55 63 62 62  Resp: 18 20 20 20   Temp: (!) 97.5 F (36.4 C) 97.9 F (36.6 C) (!) 97.4 F (36.3 C)   TempSrc: Oral Oral Oral   SpO2: 98% 97% 98% 97%  Weight:   78.6 kg   Height:        Intake/Output Summary (Last 24 hours) at 05/29/2019 1034 Last data filed at 05/29/2019 0541 Gross per 24 hour  Intake 7.2 ml  Output 1500 ml  Net -1492.8 ml   Filed Weights   05/28/19 0025 05/29/19 0533  Weight: 78.5 kg 78.6 kg    Examination:  General exam: Awake alert pleasant no distress seems much calmer than yesterday Respiratory system: Clear no added sound no rales Cardiovascular system: S1-S2 no murmur on monitors frequent PVCs in and out of A. fib Gastrointestinal system: Abdomen soft no rebound. Central  nervous system: Neurologically intact no focal deficit Extremities: No lower extremity edema no abdominal edema Skin: Not swollen Psychiatry: Euthymic and congruent    Data Reviewed: I have personally reviewed following labs and imaging studies Cxr this am pending I/po minues 1.49 Mild rise in creat-LFT normal K 3.8--->4.5 Bun/creat 30/1.05-->30/1.14  Radiology Studies: DG Chest 2 View  Result Date: 05/28/2019 CLINICAL DATA:  Chest pain. EXAM: CHEST - 2 VIEW COMPARISON:  March 08, 2019 FINDINGS: The there is stable right-sided venous Port-A-Cath positioning. A dual lead AICD is noted. Mild atelectasis and/or infiltrate is seen within the retrocardiac region of the left lung base. There is no evidence of a pleural effusion or pneumothorax. The cardiac silhouette is markedly enlarged and unchanged in size. There is marked severity calcification of the aortic arch. The visualized skeletal structures are unremarkable. IMPRESSION: Mild left basilar atelectasis and/or infiltrate. Electronically  Signed   By: Virgina Norfolk M.D.   On: 05/28/2019 01:43      Scheduled Meds: . atorvastatin  80 mg Oral Daily  . clopidogrel  75 mg Oral Daily  . dofetilide  125 mcg Oral BID  . furosemide  40 mg Intravenous Q12H  . gabapentin  300 mg Oral BID  . isosorbide mononitrate  90 mg Oral Daily  . lidocaine  1 patch Transdermal Q24H  . losartan  50 mg Oral Daily  . metoprolol  200 mg Oral Daily  . warfarin  2 mg Oral Q T,Th,S,Su-1800   And  . [START ON 05/30/2019] warfarin  1 mg Oral Q M,W,F-1800  . Warfarin - Pharmacist Dosing Inpatient   Does not apply q1600   Continuous Infusions: . sodium chloride 250 mL (05/28/19 2119)     LOS: 0 days    Time spent: [place], MD [facility]   To contact the attending provider between 7A-7P or the covering provider during after hours 7P-7A, please log into the web site [email or web address] and access using universal [place]  password for that web site. If you do not have the password, please call the hospital operator.  05/29/2019, 10:34 AM

## 2019-05-29 NOTE — Progress Notes (Signed)
[facility] for Warfarin Indication: atrial fibrillation  Allergies  Allergen Reactions  . Bee Venom Anaphylaxis  . Other Nausea And Vomiting and Other (See Comments)    Pain medications cause severe vomiting. Tolerated slow IV morphine drip  . Oxycodone Hcl Nausea And Vomiting  . Amiodarone Nausea Only  . Chlorhexidine   . Prednisone Palpitations and Other (See Comments)    "Rapid Heart Beat"    Patient Measurements: Height: 5' (152.4 cm) Weight: 78.6 kg (173 lb 3.2 oz) IBW/kg (Calculated) : 45.5  Vital Signs: Temp: 97.4 F (36.3 C) (04/25 0533) Temp Source: Oral (04/25 0533) BP: 120/48 (04/25 0948) Pulse Rate: 62 (04/25 0948)  Labs: Recent Labs    05/26/19 1549 05/28/19 0028 05/28/19 0028 05/28/19 0042 05/28/19 0228 05/28/19 0453 05/28/19 0650 05/29/19 0622  HGB  --  13.0  --   --   --   --   --  12.8  HCT  --  40.9  --   --   --   --   --  40.3  PLT  --  158  --   --   --   --   --  157  LABPROT  --   --   --  23.0*  --  21.5*  --  24.7*  INR  --   --   --  2.1*  --  1.9*  --  2.2*  CREATININE 0.99 1.05*  --   --   --   --   --  1.14*  TROPONINIHS  --  53*   < >  --  61* 73* 98*  --    < > = values in this interval not displayed.    Estimated Creatinine Clearance: 32.8 mL/min (A) (by C-G formula based on SCr of 1.14 mg/dL (H)).   Medical History: Past Medical History:  Diagnosis Date  . Bleeding behind the abdominal cavity 10/2017  . Chronic anticoagulation - coumadin, CHADS2VASC=6 05/17/2015  . CKD (chronic kidney disease), stage III   . Combined systolic and diastolic heart failure ([place])   . Coronary artery disease    a. s/p multiple stents - stenting x 2 to the LAD and x 1 to the LCx in 2000, rotational arthrectomy to proximal LCx and DES to LAD in 2014 and DES to LCx in 2014, along with DES to LAD for re-in-stent stenosis in 2015, and DES to ostial LAD in 2016. b. 07/2016 - orbital atherectomy & DES to mid LCx.  .  Diverticulitis   . Granulosa cell carcinoma ([place])    abd; last episode was in 2009  . Hyperlipidemia   . Hypertension   . LBBB (left bundle branch block)   . Myocardial infarction ([place]) 2002  . Obesity   . OSA on CPAP   . Ovarian ca ([place]) 2019  . Pacemaker failure    a. Prior leadless PPM with premature battery failure, being managed conservatively without replacement.  . Peripheral vascular disease ([place])   . Permanent atrial fibrillation ([place]) 2013  . Renal artery stenosis ([place])   . Tachycardia-bradycardia syndrome ([place])    a. s/p leadless pacemaker ([name]) implanted by Dr [name]  . Varicose veins     Assessment: 86 yo F with hx of atrial fibrillation (CHADS VASc 6) on warfarin PTA presents with CP. Pt is [facility], last clinic INR was 2.9 on 05/03/19. Pharmacy asked to continue warfarin during admission.  INR therapeutic at  2.2 today after giving a higher dose yesterday. Will continue with PTA dose today. CBC wnl, no overt bleeding noted.  PTA warfarin regimen: 2 mg daily except 1 mg on MWF INR upon admission: 1.9  Goal of Therapy:  INR 2-3 Monitor platelets by anticoagulation protocol: Yes   Plan:  Warfarin 2mg  PO x1 tonight  Monitor daily INR, CBC, s/sx bleeding  Richardine Service, PharmD PGY1 Pharmacy Resident Phone: [phone] 05/29/2019  10:59 AM  Please check [email or web address] for unit-specific pharmacy phone numbers.

## 2019-05-29 NOTE — Progress Notes (Signed)
Progress Note  Patient Name: Patty [name] Date of Encounter: 05/29/2019  Primary Cardiologist: Larae Grooms, MD   Subjective   Patient summary:  Ms. Dame is an 86 year old female with a complex past medical history including coronary artery disease status post multiple prior interventions, chronic combined CHF, permanent atrial fibrillation/flutter on warfarin, tachycardia-bradycardia syndrome status post leadless permanent pacemaker with premature battery failure, closely observed and conservatively managed, granulosis cell carcinoma, hypertension, hyperlipidemia, chronic left bundle branch block, renal artery stenosis, OSA, CKD.  We have been asked to see the patient for shortness of breath and elevated troponin with chest pain.  Patient feels "so much better" today. No longer requiring oxygen.  Echo - no change from october   Inpatient Medications    Scheduled Meds: . atorvastatin  80 mg Oral Daily  . clopidogrel  75 mg Oral Daily  . dofetilide  125 mcg Oral BID  . furosemide  40 mg Intravenous Q12H  . gabapentin  300 mg Oral BID  . isosorbide mononitrate  90 mg Oral Daily  . lidocaine  1 patch Transdermal Q24H  . losartan  50 mg Oral Daily  . metoprolol  200 mg Oral Daily  . warfarin  2 mg Oral Q T,Th,S,Su-1800   And  . [START ON 05/30/2019] warfarin  1 mg Oral Q M,W,F-1800  . Warfarin - Pharmacist Dosing Inpatient   Does not apply q1600   Continuous Infusions: . sodium chloride 250 mL (05/28/19 2119)   PRN Meds: sodium chloride, acetaminophen, albuterol, nitroGLYCERIN, ondansetron (ZOFRAN) IV   Vital Signs    Vitals:   05/28/19 1454 05/28/19 1501 05/28/19 2054 05/29/19 0533  BP: (!) 126/101 127/77 (!) 134/48 (!) 122/56  Pulse: (!) 112 (!) 55 63 62  Resp: 18 18 20 20   Temp: (!) 97.3 F (36.3 C) (!) 97.5 F (36.4 C) 97.9 F (36.6 C) (!) 97.4 F (36.3 C)  TempSrc: Oral Oral Oral Oral  SpO2: 97% 98% 97% 98%  Weight:    78.6 kg  Height:         Intake/Output Summary (Last 24 hours) at 05/29/2019 0820 Last data filed at 05/29/2019 0541 Gross per 24 hour  Intake 7.2 ml  Output 1500 ml  Net -1492.8 ml   Last 3 Weights 05/29/2019 05/28/2019 05/26/2019  Weight (lbs) 173 lb 3.2 oz 173 lb 174 lb 12.8 oz  Weight (kg) 78.563 kg 78.472 kg 79.289 kg      Telemetry    Afib, lbbb- Personally Reviewed  ECG    No new - Personally Reviewed  Physical Exam   GEN: No acute distress.   Neck: No JVD Cardiac: iRRR, no murmurs, rubs, or gallops.  Respiratory: Clear to auscultation bilaterally. GI: Soft, nontender, non-distended  MS: No edema; No deformity. Neuro:  Nonfocal  Psych: Normal affect   Labs    High Sensitivity Troponin:   Recent Labs  Lab 05/28/19 0028 05/28/19 0228 05/28/19 0453 05/28/19 0650  TROPONINIHS 53* 61* 73* 98*      Chemistry Recent Labs  Lab 05/26/19 1549 05/28/19 0028 05/29/19 0622  NA 142 145 146*  K 3.9 3.8 4.5  CL 102 105 108  CO2 29 30 29   GLUCOSE 106* 118* 102*  BUN 26* 30* 30*  CREATININE 0.99 1.05* 1.14*  CALCIUM 8.9 8.7* 8.4*  PROT  --   --  5.5*  ALBUMIN  --   --  2.5*  AST  --   --  41  ALT  --   --  31  ALKPHOS  --   --  24  BILITOT  --   --  1.2  GFRNONAA 52* 48* 44*  GFRAA 60* 56* 50*  ANIONGAP 11 10 9      Hematology Recent Labs  Lab 05/28/19 0028 05/29/19 0622  WBC 5.5 5.7  RBC 4.08 3.96  HGB 13.0 12.8  HCT 40.9 40.3  MCV 100.2* 101.8*  MCH 31.9 32.3  MCHC 31.8 31.8  RDW 15.0 15.2  PLT 158 157    BNP Recent Labs  Lab 05/28/19 0453  BNP 1,018.0*     DDimer No results for input(s): DDIMER in the last 168 hours.   Radiology    DG Chest 2 View  Result Date: 05/28/2019 CLINICAL DATA:  Chest pain. EXAM: CHEST - 2 VIEW COMPARISON:  March 08, 2019 FINDINGS: The there is stable right-sided venous Port-A-Cath positioning. A dual lead AICD is noted. Mild atelectasis and/or infiltrate is seen within the retrocardiac region of the left lung base. There is  no evidence of a pleural effusion or pneumothorax. The cardiac silhouette is markedly enlarged and unchanged in size. There is marked severity calcification of the aortic arch. The visualized skeletal structures are unremarkable. IMPRESSION: Mild left basilar atelectasis and/or infiltrate. Electronically Signed   By: Virgina Norfolk M.D.   On: 05/28/2019 01:43    Cardiac Studies   Echo pending  Patient Profile     86 y.o. female with prior CAD now with minimally elevated troponin and respiratory decompensation.  Assessment & Plan    CAD status post prior PCI with stable disease in October 2020 - no change on echo. - dose of imdur increased to 90 mg daily.   Heart failure with reduced ejection fraction- - she is being diuresed with Lasix 40 mg IV twice daily - 1.5 L out yesterday. No ins charted.  - weight unchanged.  Continue iv lasix today and reassess tomorrow.   Atrial fibrillation/atrial flutter, permanent -patient is continued on Tikosyn and metoprolol per A. fib clinic where she was recently seen.  Continue these medications in hospital.  Continue warfarin, INR 2.2, adjustments per pharmacy recommended.  Hypertension-continue losartan, metoprolol  Hyperlipidemia-continue atorvastatin.      For questions or updates, please contact [place] Please consult [email or web address] for contact info under        Signed, Elouise Munroe, MD  05/29/2019, 8:20 AM

## 2019-05-30 ENCOUNTER — Inpatient Hospital Stay (HOSPITAL_COMMUNITY): Payer: Medicare Other

## 2019-05-30 LAB — CBC WITH DIFFERENTIAL/PLATELET
Abs Immature Granulocytes: 0.02 10*3/uL (ref 0.00–0.07)
Basophils Absolute: 0 10*3/uL (ref 0.0–0.1)
Basophils Relative: 1 %
Eosinophils Absolute: 0.2 10*3/uL (ref 0.0–0.5)
Eosinophils Relative: 3 %
HCT: 41.4 % (ref 36.0–46.0)
Hemoglobin: 13.1 g/dL (ref 12.0–15.0)
Immature Granulocytes: 0 %
Lymphocytes Relative: 21 %
Lymphs Abs: 1.7 10*3/uL (ref 0.7–4.0)
MCH: 31.9 pg (ref 26.0–34.0)
MCHC: 31.6 g/dL (ref 30.0–36.0)
MCV: 100.7 fL — ABNORMAL HIGH (ref 80.0–100.0)
Monocytes Absolute: 0.7 10*3/uL (ref 0.1–1.0)
Monocytes Relative: 8 %
Neutro Abs: 5.4 10*3/uL (ref 1.7–7.7)
Neutrophils Relative %: 67 %
Platelets: 152 10*3/uL (ref 150–400)
RBC: 4.11 MIL/uL (ref 3.87–5.11)
RDW: 15.1 % (ref 11.5–15.5)
WBC: 8 10*3/uL (ref 4.0–10.5)
nRBC: 0 % (ref 0.0–0.2)

## 2019-05-30 LAB — COMPREHENSIVE METABOLIC PANEL
ALT: 28 U/L (ref 0–44)
AST: 35 U/L (ref 15–41)
Albumin: 2.9 g/dL — ABNORMAL LOW (ref 3.5–5.0)
Alkaline Phosphatase: 59 U/L (ref 38–126)
Anion gap: 9 (ref 5–15)
BUN: 32 mg/dL — ABNORMAL HIGH (ref 8–23)
CO2: 33 mmol/L — ABNORMAL HIGH (ref 22–32)
Calcium: 8.2 mg/dL — ABNORMAL LOW (ref 8.9–10.3)
Chloride: 104 mmol/L (ref 98–111)
Creatinine, Ser: 1.24 mg/dL — ABNORMAL HIGH (ref 0.44–1.00)
GFR calc Af Amer: 46 mL/min — ABNORMAL LOW (ref >60)
GFR calc non Af Amer: 39 mL/min — ABNORMAL LOW (ref >60)
Glucose, Bld: 116 mg/dL — ABNORMAL HIGH (ref 70–99)
Potassium: 3.7 mmol/L (ref 3.5–5.1)
Sodium: 146 mmol/L — ABNORMAL HIGH (ref 135–145)
Total Bilirubin: 1.1 mg/dL (ref 0.3–1.2)
Total Protein: 6.2 g/dL — ABNORMAL LOW (ref 6.5–8.1)

## 2019-05-30 LAB — PROTIME-INR
INR: 2.6 — ABNORMAL HIGH (ref 0.8–1.2)
Prothrombin Time: 27.8 seconds — ABNORMAL HIGH (ref 11.4–15.2)

## 2019-05-30 LAB — MAGNESIUM: Magnesium: 1.9 mg/dL (ref 1.7–2.4)

## 2019-05-30 MED ORDER — POTASSIUM CHLORIDE CRYS ER 20 MEQ PO TBCR
20.0000 meq | EXTENDED_RELEASE_TABLET | Freq: Two times a day (BID) | ORAL | Status: DC
  Administered 2019-05-30 – 2019-05-31 (×3): 20 meq via ORAL
  Filled 2019-05-30 (×3): qty 1

## 2019-05-30 MED ORDER — WARFARIN SODIUM 1 MG PO TABS
1.0000 mg | ORAL_TABLET | Freq: Once | ORAL | Status: AC
  Administered 2019-05-30: 18:00:00 1 mg via ORAL
  Filled 2019-05-30 (×2): qty 1

## 2019-05-30 MED ORDER — MAGNESIUM OXIDE 400 (241.3 MG) MG PO TABS
800.0000 mg | ORAL_TABLET | Freq: Every day | ORAL | Status: DC
  Administered 2019-05-30 – 2019-05-31 (×2): 800 mg via ORAL
  Filled 2019-05-30 (×2): qty 2

## 2019-05-30 NOTE — Progress Notes (Signed)
PROGRESS NOTE    Patty [name]  [id number] DOB: [birth year] DOA: 05/28/2019 PCP: [name], MD    Chief Complaint  Patient presents with  . Chest Pain    Brief Narrative:  40 white female tachybradycardia syndrome + leadless PPM-?  Battery failure CAD/stents Granulosa cell tumor of the ovary-under care of Dr. Simeon Craft such-now under consideration for focus on palliative and comfort at end-of-life per last office visit note 05/03/2019 CKD 3/3B with LBBB RAS PVD OSA  It appears was admitted 10 6-10/13 with AECHF persistent A. fib-cardiac cath 11/10/2018 with underlying three-vessel disease and a severely reduced LV systolic function [id number] was also cardioverted on 11/15/2022 persistent atrial fibrillation   Patient was seen at office visit with Roderic Palau and was out of rhythm she was feeling well and was told to continue dofetilide 125 mcg twice daily and metoprolol XL 200 daily it is noted in that note that she used nitroglycerin as well recently  Troponin was 53-->61-->98 proBNP 1018 procalcitonin 1.3 CXR?  Infiltrate versus not patient is compliant on her medications typically-is on goal-directed therapy ARB Imdur metoprolol EKG not really specific for ischemia but has pacer   Assessment & Plan:   Principal Problem:   Chest pain Active Problems:   CAD (coronary artery disease)   Essential hypertension   Hyperlipidemia   Renal artery stenosis ([name])   Obesity (BMI 30.0-34.9)   Granulosa cell tumor of ovary   Tachycardia-bradycardia syndrome ([name])   Combined systolic and diastolic heart failure, NYHA class 3 ([name])   Chronic anticoagulation - coumadin, CHADS2VASC=6   Status post coronary artery stent placement   Atrial fibrillation, chronic ([name])   Heart failure ([name])     Multifactorial dyspnea  Likely cause is decompensated right-sided heart failure-has a history of baseline NYHA class III dyspnea differential diagnosis right-sided  pneumonia  Patient tells me PCP was treating her for 2 to 3 days in March with an antibiotic but she only took 1 or 2 days of  procalcitonin is less than 0.10, repeat CXR/26 suggested fluid overload-no antibiotics  Likely decompensated systolic heart failure  Change IV Lasix to p.o. Lasix 4/26-await cardiology input regarding dosing-developing alkalosis likely secondary to overdiuresis  Current meds Cozaar 50, metoprolol 200 XL, Imdur 90 (this is an increase)  EF 40-45% moderately decreased function per cardiology this represents no change-do not anticipate further work-up  Tachybradycardia syndrome QTC 540 at times-leadless pacemaker placement in the past-chronic LBBB  QTC can go up to 600-continue dofetilide and other meds-rest as per cardiology-magnesium is 1.9  Continue anticoagulation with Coumadin  CKD 3 Developing metabolic alkalosis  Care with diuresis monitor trends of creatinine on a.m.  I think she has been over diuresed-holding diuretics-defer to cardiology-May consider Diamox  OSA on CPAP  Depending on echo and RSVP may need titration-not sure how aggressive she would want to be in terms of cath versus not-resume CPAP tonight with home settings  RAS  Continue Plavix 75 daily   DVT prophylaxis: On Coumadin Code Status: DNR Family Communication: No family present at the bedside Disposition: Inpatient  Status is: Observation  The patient will require care spanning > 2 midnights and should be moved to inpatient because: Hemodynamically unstable  Dispo: The patient is from: Home              Anticipated d/c is to: Home              Anticipated d/c date is: 2 days 7  Patient currently is not medically stable to d/c.   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 and she is "ready" if her end is near Right now she has no swelling, no fever, no chest pain    Objective: Vitals:   05/30/19 0433 05/30/19 0811 05/30/19 0812 05/30/19 1142  BP: (!) 116/91 (!) 173/73 (!) 173/73 138/64  Pulse: 67 68 68 64  Resp: 16   18  Temp: 98.9 F (37.2 C)   98 F (36.7 C)  TempSrc: Oral   Oral  SpO2: 93%   98%  Weight: 78 kg     Height:        Intake/Output Summary (Last 24 hours) at 05/30/2019 1229 Last data filed at 05/30/2019 0900 Gross per 24 hour  Intake 720 ml  Output 501 ml  Net 219 ml   Filed Weights   05/28/19 0025 05/29/19 0533 05/30/19 0433  Weight: 78.5 kg 78.6 kg 78 kg    Examination:  General exam: Awake alert pleasant no distress seems much calmer than yesterday neck soft supple no JVD no bruit Respiratory system: Clear no added sound no rales Cardiovascular system: S1-S2 no murmur on monitors frequent PVCs in and out of A. fib-paced on monitors Gastrointestinal system: Abdomen soft no rebound. Central nervous system: Neurologically intact no focal deficit Extremities: No lower extremity edema Skin: Thin skin, evidences of stasis dermatitis Psychiatry: Euthymic and congruent    Data Reviewed: I have personally reviewed following labs and imaging studies Cxr this shows improved infiltrate right side  I/O = -2.27 L Mild rise in creat-LFT normal K 3.8--->4.5-->3.7 Bun/creat 30/1.05-->30/1.14-->32/1.2  Radiology Studies: DG Chest 2 View  Result Date: 05/30/2019 CLINICAL DATA:  Pneumonia with shortness of breath EXAM: CHEST - 2 VIEW COMPARISON:  Yesterday FINDINGS: Improved aeration at the right base. Interlobular septal thickening and trace pleural effusions. There is a dual-chamber pacer from the left in unremarkable position. Implantable loop recorder. Port on the right with tip at the SVC. Cardiomegaly. Aortic and  coronary atherosclerosis. IMPRESSION: 1. Improved right lower lobe aeration. 2. Cardiomegaly and mild interstitial edema. Electronically Signed   By: Monte Fantasia M.D.   On: 05/30/2019 07:13   DG Chest 2 View  Result Date: 05/29/2019 CLINICAL DATA:  Chest pain. EXAM: CHEST - 2 VIEW COMPARISON:  May 28, 2019 FINDINGS: New opacity in the right base. There may be a small associated effusion. Left retrocardiac opacity remains. Stable cardiomegaly. Stable right Port-A-Cath and pacemaker. No pneumothorax. No other abnormalities. IMPRESSION: 1. New opacity in the right base may represent pneumonia or aspiration. Given the associated effusion, compressive atelectasis is possible. Recommend clinical correlation. Stable left retrocardiac opacity. Electronically Signed   By: Dorise Bullion III M.D   On: 05/29/2019 13:32   ECHOCARDIOGRAM LIMITED  Result Date: 05/29/2019    ECHOCARDIOGRAM LIMITED REPORT   Patient Name:   VYLETTE WORK Cuthrell Date of Exam: 05/29/2019 Medical Rec #:  [id number]       Height:       60.0 in Accession #:    [id number]      Weight:       173.2 lb Date of Birth:  Nov 14, 1930  BSA:          1.756 m Patient Age:    22 years        BP:           122/56 mmHg Patient Gender: F               HR:           84 bpm. Exam Location:  Inpatient Procedure: Limited Echo, Cardiac Doppler, Color Doppler and Intracardiac            Opacification Agent                                MODIFIED REPORT:  This report was modified by Cherlynn Kaiser MD on 05/29/2019 due to no changes                                      made.  Indications:     Chest Pain 786.50/ R07.9  History:         Patient has prior history of Echocardiogram examinations, most                  recent 11/12/2018. CAD, Pacemaker, Arrythmias:Atrial                  Fibrillation, Atrial Flutter and non-specific ST changes,                  Signs/Symptoms:Chest Pain; Risk Factors:Hypertension,                  Dyslipidemia and Former Smoker. DOE.   Sonographer:     Vickie Epley RDCS Referring Phys:  [id number] Elouise Munroe Diagnosing Phys: Cherlynn Kaiser MD IMPRESSIONS  1. Left ventricular ejection fraction, by estimation, is 40 to 45%. The left ventricle has moderately decreased function. The left ventricle demonstrates regional wall motion abnormalities (see scoring diagram/findings for description). There is severe left ventricular hypertrophy. Left ventricular diastolic parameters are indeterminate.  2. Right ventricular systolic function is mildly reduced. The right ventricular size is normal.  3. Left atrial size was moderately dilated.  4. The mitral valve is degenerative. Trivial mitral valve regurgitation.  5. The aortic valve is abnormal. Aortic valve regurgitation is mild. Mild aortic valve sclerosis is present, with no evidence of aortic valve stenosis.  6. The inferior vena cava is normal in size with greater than 50% respiratory variability, suggesting right atrial pressure of 3 mmHg. Comparison(s): A prior study was performed on 11/12/2018. No significant change from prior study. FINDINGS  Left Ventricle: Left ventricular ejection fraction, by estimation, is 40 to 45%. The left ventricle has moderately decreased function. The left ventricle demonstrates regional wall motion abnormalities. Definity contrast agent was given IV to delineate the left ventricular endocardial borders. There is severe left ventricular hypertrophy. Abnormal (paradoxical) septal motion, consistent with left bundle branch block.  LV Wall Scoring: The apical septal segment is akinetic. The mid inferoseptal segment is hypokinetic. Right Ventricle: The right ventricular size is normal. Right ventricular systolic function is mildly reduced. Left Atrium: Left atrial size was moderately dilated. Mitral Valve: The mitral valve is degenerative in appearance. Mild to moderate mitral annular calcification. Trivial mitral valve regurgitation. Aortic Valve: The aortic valve is  abnormal. Aortic valve regurgitation is mild. Mild aortic valve sclerosis is present, with no evidence of  aortic valve stenosis. There is moderate calcification of the aortic valve. Aortic valve mean gradient measures 6.6 mmHg. Aortic valve peak gradient measures 14.2 mmHg. Aortic valve area, by VTI measures 1.56 cm. Pulmonic Valve: The pulmonic valve was normal in structure. Pulmonic valve regurgitation is trivial. Venous: The inferior vena cava is normal in size with greater than 50% respiratory variability, suggesting right atrial pressure of 3 mmHg.  LEFT VENTRICLE PLAX 2D LVIDd:         4.20 cm LVIDs:         3.30 cm LV PW:         1.60 cm LV IVS:        1.60 cm LVOT diam:     1.90 cm LV SV:         54 LV SV Index:   31 LVOT Area:     2.84 cm  LEFT ATRIUM         Index LA diam:    4.90 cm 2.79 cm/m  AORTIC VALVE AV Area (Vmax):    1.47 cm AV Area (Vmean):   1.61 cm AV Area (VTI):     1.56 cm AV Vmax:           188.48 cm/s AV Vmean:          116.860 cm/s AV VTI:            0.349 m AV Peak Grad:      14.2 mmHg AV Mean Grad:      6.6 mmHg LVOT Vmax:         97.90 cm/s LVOT Vmean:        66.300 cm/s LVOT VTI:          0.192 m LVOT/AV VTI ratio: 0.55  AORTA Ao Root diam: 3.00 cm  SHUNTS Systemic VTI:  0.19 m Systemic Diam: 1.90 cm Cherlynn Kaiser MD Electronically signed by Cherlynn Kaiser MD Signature Date/Time: 05/29/2019/12:00:41 PM    Final (Updated)       Scheduled Meds: . atorvastatin  80 mg Oral Daily  . clopidogrel  75 mg Oral Daily  . dofetilide  125 mcg Oral BID  . furosemide  40 mg Oral BID  . gabapentin  300 mg Oral BID  . isosorbide mononitrate  90 mg Oral Daily  . lidocaine  1 patch Transdermal Q24H  . losartan  50 mg Oral Daily  . metoprolol  200 mg Oral Daily  . warfarin  1 mg Oral ONCE-1600  . Warfarin - Pharmacist Dosing Inpatient   Does not apply q1600   Continuous Infusions: . sodium chloride 250 mL (05/28/19 2119)     LOS: 1 day    Time spent: [place], MD [facility]   To contact the attending provider between 7A-7P or the covering provider during after hours 7P-7A, please log into the web site [email or web address] and access using universal [place] password for that web site. If you do not have the password, please call the hospital operator.  05/30/2019, 12:29 PM

## 2019-05-30 NOTE — Progress Notes (Signed)
[facility] for Warfarin Indication: atrial fibrillation  Allergies  Allergen Reactions  . Bee Venom Anaphylaxis  . Other Nausea And Vomiting and Other (See Comments)    Pain medications cause severe vomiting. Tolerated slow IV morphine drip  . Oxycodone Hcl Nausea And Vomiting  . Amiodarone Nausea Only  . Chlorhexidine   . Prednisone Palpitations and Other (See Comments)    "Rapid Heart Beat"    Patient Measurements: Height: 5' (152.4 cm) Weight: 78 kg (172 lb) IBW/kg (Calculated) : 45.5  Vital Signs: Temp: 98 F (36.7 C) (04/26 1142) Temp Source: Oral (04/26 1142) BP: 138/64 (04/26 1142) Pulse Rate: 64 (04/26 1142)  Labs: Recent Labs    05/28/19 0028 05/28/19 0028 05/28/19 0042 05/28/19 0228 05/28/19 0453 05/28/19 0650 05/29/19 0622 05/30/19 0453  HGB 13.0   < >  --   --   --   --  12.8 13.1  HCT 40.9  --   --   --   --   --  40.3 41.4  PLT 158  --   --   --   --   --  157 152  LABPROT  --   --    < >  --  21.5*  --  24.7* 27.8*  INR  --   --    < >  --  1.9*  --  2.2* 2.6*  CREATININE 1.05*  --   --   --   --   --  1.14* 1.24*  TROPONINIHS 53*  --    < > 61* 73* 98*  --   --    < > = values in this interval not displayed.    Estimated Creatinine Clearance: 30.1 mL/min (A) (by C-G formula based on SCr of 1.24 mg/dL (H)).   Medical History: Past Medical History:  Diagnosis Date  . Bleeding behind the abdominal cavity 10/2017  . Chronic anticoagulation - coumadin, CHADS2VASC=6 05/17/2015  . CKD (chronic kidney disease), stage III   . Combined systolic and diastolic heart failure ([place])   . Coronary artery disease    a. s/p multiple stents - stenting x 2 to the LAD and x 1 to the LCx in 2000, rotational arthrectomy to proximal LCx and DES to LAD in 2014 and DES to LCx in 2014, along with DES to LAD for re-in-stent stenosis in 2015, and DES to ostial LAD in 2016. b. 07/2016 - orbital atherectomy & DES to mid LCx.  .  Diverticulitis   . Granulosa cell carcinoma ([place])    abd; last episode was in 2009  . Hyperlipidemia   . Hypertension   . LBBB (left bundle branch block)   . Myocardial infarction ([place]) 2002  . Obesity   . OSA on CPAP   . Ovarian ca ([place]) 2019  . Pacemaker failure    a. Prior leadless PPM with premature battery failure, being managed conservatively without replacement.  . Peripheral vascular disease ([place])   . Permanent atrial fibrillation ([place]) 2013  . Renal artery stenosis ([place])   . Tachycardia-bradycardia syndrome ([place])    a. s/p leadless pacemaker ([name]) implanted by Dr [name]  . Varicose veins     Assessment: 86 yo F with hx of atrial fibrillation (CHADS VASc 6) on warfarin PTA presents with CP. [facility], last clinic INR was 2.9 on 05/03/19. Pharmacy asked to continue warfarin during admission.  INR therapeutic at 2.6 today. Will continue with  PTA dose today. CBC wnl, no overt bleeding noted.  PTA warfarin regimen: 2 mg daily except 1 mg on MWF INR upon admission: 1.9  Goal of Therapy:  INR 2-3 Monitor platelets by anticoagulation protocol: Yes   Plan:  Warfarin 1mg  PO x1 tonight  Monitor daily INR, CBC, s/sx bleeding   Nicole Cella, RPh Clinical Pharmacist Phone: [phone] 05/30/2019  12:11 PM  Please check [email or web address] for unit-specific pharmacy phone numbers.

## 2019-05-30 NOTE — Progress Notes (Signed)
[facility].  Referral was rec'd from Dr Alvy Bimler for [facility] and pt was admitted to [facility] on 05/19/19.  [facility] RN and SW follow pt at home.  Will continue to follow hospital course and resume services when she discharges back home.  Please call [facility] if we can assist with d/c planning.  Wynetta Fines, RN   Office [phone] Mobile [phone]

## 2019-05-30 NOTE — Progress Notes (Signed)
Progress Note  Patient Name: Patty [name] Date of Encounter: 05/30/2019  Primary Cardiologist: Larae Grooms, MD   Subjective   86 year old female with a history of permanent atrial fibrillation, coronary artery disease, hypertension, hyperlipidemia, left bundle branch block, leadless pacemaker who we are following for elevated troponin level and CHf   Inpatient Medications    Scheduled Meds: . atorvastatin  80 mg Oral Daily  . clopidogrel  75 mg Oral Daily  . dofetilide  125 mcg Oral BID  . furosemide  40 mg Oral BID  . gabapentin  300 mg Oral BID  . isosorbide mononitrate  90 mg Oral Daily  . lidocaine  1 patch Transdermal Q24H  . losartan  50 mg Oral Daily  . metoprolol  200 mg Oral Daily  . warfarin  1 mg Oral ONCE-1600  . Warfarin - Pharmacist Dosing Inpatient   Does not apply q1600   Continuous Infusions: . sodium chloride 250 mL (05/28/19 2119)   PRN Meds: sodium chloride, acetaminophen, albuterol, nitroGLYCERIN, ondansetron (ZOFRAN) IV   Vital Signs    Vitals:   05/30/19 0433 05/30/19 0811 05/30/19 0812 05/30/19 1142  BP: (!) 116/91 (!) 173/73 (!) 173/73 138/64  Pulse: 67 68 68 64  Resp: 16   18  Temp: 98.9 F (37.2 C)   98 F (36.7 C)  TempSrc: Oral   Oral  SpO2: 93%   98%  Weight: 78 kg     Height:        Intake/Output Summary (Last 24 hours) at 05/30/2019 1248 Last data filed at 05/30/2019 0900 Gross per 24 hour  Intake 720 ml  Output 501 ml  Net 219 ml   Last 3 Weights 05/30/2019 05/29/2019 05/28/2019  Weight (lbs) 172 lb 173 lb 3.2 oz 173 lb  Weight (kg) 78.019 kg 78.563 kg 78.472 kg      Telemetry    Atrial flutter , controlled V response  - Personally Reviewed  ECG     - Personally Reviewed  Physical Exam   GEN:  elderly female,  NAD   Neck: No JVD Cardiac: irreg irreg. , no murmurs, rubs, or gallops.  Respiratory: Clear to auscultation bilaterally. GI: Soft, nontender, non-distended  MS: No edema; No deformity. Neuro:   Nonfocal  Psych: Normal affect   Labs    High Sensitivity Troponin:   Recent Labs  Lab 05/28/19 0028 05/28/19 0228 05/28/19 0453 05/28/19 0650  TROPONINIHS 53* 61* 73* 98*      Chemistry Recent Labs  Lab 05/28/19 0028 05/29/19 0622 05/30/19 0453  NA 145 146* 146*  K 3.8 4.5 3.7  CL 105 108 104  CO2 30 29 33*  GLUCOSE 118* 102* 116*  BUN 30* 30* 32*  CREATININE 1.05* 1.14* 1.24*  CALCIUM 8.7* 8.4* 8.2*  PROT  --  5.5* 6.2*  ALBUMIN  --  2.5* 2.9*  AST  --  41 35  ALT  --  31 28  ALKPHOS  --  49 59  BILITOT  --  1.2 1.1  GFRNONAA 48* 44* 39*  GFRAA 56* 50* 46*  ANIONGAP 10 9 9      Hematology Recent Labs  Lab 05/28/19 0028 05/29/19 0622 05/30/19 0453  WBC 5.5 5.7 8.0  RBC 4.08 3.96 4.11  HGB 13.0 12.8 13.1  HCT 40.9 40.3 41.4  MCV 100.2* 101.8* 100.7*  MCH 31.9 32.3 31.9  MCHC 31.8 31.8 31.6  RDW 15.0 15.2 15.1  PLT 158 157 152    BNP Recent  Labs  Lab 05/28/19 0453  BNP 1,018.0*     DDimer No results for input(s): DDIMER in the last 168 hours.   Radiology    DG Chest 2 View  Result Date: 05/30/2019 CLINICAL DATA:  Pneumonia with shortness of breath EXAM: CHEST - 2 VIEW COMPARISON:  Yesterday FINDINGS: Improved aeration at the right base. Interlobular septal thickening and trace pleural effusions. There is a dual-chamber pacer from the left in unremarkable position. Implantable loop recorder. Port on the right with tip at the SVC. Cardiomegaly. Aortic and coronary atherosclerosis. IMPRESSION: 1. Improved right lower lobe aeration. 2. Cardiomegaly and mild interstitial edema. Electronically Signed   By: Monte Fantasia M.D.   On: 05/30/2019 07:13   DG Chest 2 View  Result Date: 05/29/2019 CLINICAL DATA:  Chest pain. EXAM: CHEST - 2 VIEW COMPARISON:  May 28, 2019 FINDINGS: New opacity in the right base. There may be a small associated effusion. Left retrocardiac opacity remains. Stable cardiomegaly. Stable right Port-A-Cath and pacemaker. No  pneumothorax. No other abnormalities. IMPRESSION: 1. New opacity in the right base may represent pneumonia or aspiration. Given the associated effusion, compressive atelectasis is possible. Recommend clinical correlation. Stable left retrocardiac opacity. Electronically Signed   By: Dorise Bullion III M.D   On: 05/29/2019 13:32   ECHOCARDIOGRAM LIMITED  Result Date: 05/29/2019    ECHOCARDIOGRAM LIMITED REPORT   Patient Name:   Patty [name] Date of Exam: 05/29/2019 Medical Rec #:  [id number]       Height:       60.0 in Accession #:    [id number]      Weight:       173.2 lb Date of Birth:  March 12, 1930       BSA:          1.756 m Patient Age:    25 years        BP:           122/56 mmHg Patient Gender: F               HR:           84 bpm. Exam Location:  Inpatient Procedure: Limited Echo, Cardiac Doppler, Color Doppler and Intracardiac            Opacification Agent                                MODIFIED REPORT:  This report was modified by Cherlynn Kaiser MD on 05/29/2019 due to no changes                                      made.  Indications:     Chest Pain 786.50/ R07.9  History:         Patient has prior history of Echocardiogram examinations, most                  recent 11/12/2018. CAD, Pacemaker, Arrythmias:Atrial                  Fibrillation, Atrial Flutter and non-specific ST changes,                  Signs/Symptoms:Chest Pain; Risk Factors:Hypertension,                  Dyslipidemia and Former Smoker. DOE.  Sonographer:  Vickie Epley RDCS Referring Phys:  [id number] Elouise Munroe Diagnosing Phys: Cherlynn Kaiser MD IMPRESSIONS  1. Left ventricular ejection fraction, by estimation, is 40 to 45%. The left ventricle has moderately decreased function. The left ventricle demonstrates regional wall motion abnormalities (see scoring diagram/findings for description). There is severe left ventricular hypertrophy. Left ventricular diastolic parameters are indeterminate.  2. Right ventricular systolic  function is mildly reduced. The right ventricular size is normal.  3. Left atrial size was moderately dilated.  4. The mitral valve is degenerative. Trivial mitral valve regurgitation.  5. The aortic valve is abnormal. Aortic valve regurgitation is mild. Mild aortic valve sclerosis is present, with no evidence of aortic valve stenosis.  6. The inferior vena cava is normal in size with greater than 50% respiratory variability, suggesting right atrial pressure of 3 mmHg. Comparison(s): A prior study was performed on 11/12/2018. No significant change from prior study. FINDINGS  Left Ventricle: Left ventricular ejection fraction, by estimation, is 40 to 45%. The left ventricle has moderately decreased function. The left ventricle demonstrates regional wall motion abnormalities. Definity contrast agent was given IV to delineate the left ventricular endocardial borders. There is severe left ventricular hypertrophy. Abnormal (paradoxical) septal motion, consistent with left bundle branch block.  LV Wall Scoring: The apical septal segment is akinetic. The mid inferoseptal segment is hypokinetic. Right Ventricle: The right ventricular size is normal. Right ventricular systolic function is mildly reduced. Left Atrium: Left atrial size was moderately dilated. Mitral Valve: The mitral valve is degenerative in appearance. Mild to moderate mitral annular calcification. Trivial mitral valve regurgitation. Aortic Valve: The aortic valve is abnormal. Aortic valve regurgitation is mild. Mild aortic valve sclerosis is present, with no evidence of aortic valve stenosis. There is moderate calcification of the aortic valve. Aortic valve mean gradient measures 6.6 mmHg. Aortic valve peak gradient measures 14.2 mmHg. Aortic valve area, by VTI measures 1.56 cm. Pulmonic Valve: The pulmonic valve was normal in structure. Pulmonic valve regurgitation is trivial. Venous: The inferior vena cava is normal in size with greater than 50%  respiratory variability, suggesting right atrial pressure of 3 mmHg.  LEFT VENTRICLE PLAX 2D LVIDd:         4.20 cm LVIDs:         3.30 cm LV PW:         1.60 cm LV IVS:        1.60 cm LVOT diam:     1.90 cm LV SV:         54 LV SV Index:   31 LVOT Area:     2.84 cm  LEFT ATRIUM         Index LA diam:    4.90 cm 2.79 cm/m  AORTIC VALVE AV Area (Vmax):    1.47 cm AV Area (Vmean):   1.61 cm AV Area (VTI):     1.56 cm AV Vmax:           188.48 cm/s AV Vmean:          116.860 cm/s AV VTI:            0.349 m AV Peak Grad:      14.2 mmHg AV Mean Grad:      6.6 mmHg LVOT Vmax:         97.90 cm/s LVOT Vmean:        66.300 cm/s LVOT VTI:          0.192 m LVOT/AV VTI ratio: 0.55  AORTA  Ao Root diam: 3.00 cm  SHUNTS Systemic VTI:  0.19 m Systemic Diam: 1.90 cm Cherlynn Kaiser MD Electronically signed by Cherlynn Kaiser MD Signature Date/Time: 05/29/2019/12:00:41 PM    Final (Updated)     Cardiac Studies    Patient Profile     86 y.o. female   Assessment & Plan    Acute on chronic diastolic congestive heart failure: She is diuresed quite a bit and is feeling quite a bit better.  She takes Lasix 40 or 80 mg in the morning with another 40 or 80 mg in the evening.  I would continue the same dosing.  She still eats a little bit of salt and we discussed having her reduce her salt intake.  2.  Paroxysmal atrial flutter: She is in sinus rhythm 94% the time and in atrial flutter 6% of the time by pacer interrogation.  Continue Tikosyn at current dose.  She should be stable for discharge tomorrow.  I discussed this with Dr. Verlon Au.   [facility] will sign off.   Medication Recommendations:   Cont home meds.   She has PRN lasix to take if needed.  Other recommendations (labs, testing, etc):   Follow up as an outpatient:   With cardiology in several weeks   For questions or updates, please contact [place] Please consult [email or web address] for contact info under        Signed, Mertie Moores, MD    05/30/2019, 12:48 PM

## 2019-05-31 ENCOUNTER — Other Ambulatory Visit: Payer: Self-pay

## 2019-05-31 LAB — CBC WITH DIFFERENTIAL/PLATELET
Abs Immature Granulocytes: 0.02 10*3/uL (ref 0.00–0.07)
Basophils Absolute: 0 10*3/uL (ref 0.0–0.1)
Basophils Relative: 0 %
Eosinophils Absolute: 0 10*3/uL (ref 0.0–0.5)
Eosinophils Relative: 0 %
HCT: 36.9 % (ref 36.0–46.0)
Hemoglobin: 11.7 g/dL — ABNORMAL LOW (ref 12.0–15.0)
Immature Granulocytes: 0 %
Lymphocytes Relative: 18 %
Lymphs Abs: 1.6 10*3/uL (ref 0.7–4.0)
MCH: 31.9 pg (ref 26.0–34.0)
MCHC: 31.7 g/dL (ref 30.0–36.0)
MCV: 100.5 fL — ABNORMAL HIGH (ref 80.0–100.0)
Monocytes Absolute: 1 10*3/uL (ref 0.1–1.0)
Monocytes Relative: 11 %
Neutro Abs: 6.6 10*3/uL (ref 1.7–7.7)
Neutrophils Relative %: 71 %
Platelets: 137 10*3/uL — ABNORMAL LOW (ref 150–400)
RBC: 3.67 MIL/uL — ABNORMAL LOW (ref 3.87–5.11)
RDW: 15.3 % (ref 11.5–15.5)
WBC: 9.3 10*3/uL (ref 4.0–10.5)
nRBC: 0 % (ref 0.0–0.2)

## 2019-05-31 LAB — COMPREHENSIVE METABOLIC PANEL
ALT: 22 U/L (ref 0–44)
AST: 29 U/L (ref 15–41)
Albumin: 2.5 g/dL — ABNORMAL LOW (ref 3.5–5.0)
Alkaline Phosphatase: 56 U/L (ref 38–126)
Anion gap: 6 (ref 5–15)
BUN: 25 mg/dL — ABNORMAL HIGH (ref 8–23)
CO2: 29 mmol/L (ref 22–32)
Calcium: 7.7 mg/dL — ABNORMAL LOW (ref 8.9–10.3)
Chloride: 106 mmol/L (ref 98–111)
Creatinine, Ser: 1.13 mg/dL — ABNORMAL HIGH (ref 0.44–1.00)
GFR calc Af Amer: 51 mL/min — ABNORMAL LOW (ref >60)
GFR calc non Af Amer: 44 mL/min — ABNORMAL LOW (ref >60)
Glucose, Bld: 106 mg/dL — ABNORMAL HIGH (ref 70–99)
Potassium: 3.5 mmol/L (ref 3.5–5.1)
Sodium: 141 mmol/L (ref 135–145)
Total Bilirubin: 1.4 mg/dL — ABNORMAL HIGH (ref 0.3–1.2)
Total Protein: 5.4 g/dL — ABNORMAL LOW (ref 6.5–8.1)

## 2019-05-31 LAB — PROTIME-INR
INR: 2.9 — ABNORMAL HIGH (ref 0.8–1.2)
Prothrombin Time: 30.2 seconds — ABNORMAL HIGH (ref 11.4–15.2)

## 2019-05-31 LAB — MAGNESIUM: Magnesium: 2 mg/dL (ref 1.7–2.4)

## 2019-05-31 MED ORDER — ISOSORBIDE MONONITRATE ER 60 MG PO TB24: 90.0000 mg | ORAL_TABLET | Freq: Every day | ORAL | 3 refills | Status: DC

## 2019-05-31 MED ORDER — MAGNESIUM OXIDE 400 (241.3 MG) MG PO TABS: 800.0000 mg | ORAL_TABLET | Freq: Every day | ORAL | 0 refills | Status: DC

## 2019-05-31 NOTE — Plan of Care (Signed)

## 2019-05-31 NOTE — Consult Note (Signed)
Pending status: changed to Not Active  Patient will be followed by [facility].  She will be followed in their external home palliative program and receive full care management.  Natividad Brood, RN BSN [facility] Liaison  [phone] business mobile phone Toll free office [phone]  Fax number: [phone] [email or web address]

## 2019-05-31 NOTE — Discharge Summary (Signed)
Physician Discharge Summary  Patty [name] [id number] DOB: 22-Aug-1930 DOA: 05/28/2019  PCP: [name], MD  Admit date: 05/28/2019 Discharge date: 05/31/2019  Time spent: 50 minutes  Recommendations for Outpatient Follow-up:   Recommend follow-up with palliative care in the outpatient setting as she has multiple chronic diseases and has already done intake with palliative care-suggest initiation of DNI DN H DNR when she is seen in the outpatient setting-would also suggest initiation of low-dose SSRI for anxiety versus supervision with benzodiazepine given she is elderly for anxiety around end-of-life issues-this has not been done in the hospital as I do not want her to have the risk of polypharmacy and confusion  Note dosage changes of Imdur this admission-also note discontinuation of various meds not really compatible with longevity  Get Chem-12 and CBC in about 1 week  Needs transitional visit with her cardiologist Dr. Irish Lack and may be Dr. [name] who I will copy on this note  On discharge she knows to take 80 mg of Lasix in the morning and as needed Lasix 40 in the afternoon-she is sophisticated enough to manage her breathing issues  Discharge Diagnoses:  Principal Problem:   Chest pain Active Problems:   CAD (coronary artery disease)   Essential hypertension   Hyperlipidemia   Renal artery stenosis ([name])   Obesity (BMI 30.0-34.9)   Granulosa cell tumor of ovary   Tachycardia-bradycardia syndrome ([name])   Combined systolic and diastolic heart failure, NYHA class 3 ([name])   Chronic anticoagulation - coumadin, CHADS2VASC=6   Status post coronary artery stent placement   Atrial fibrillation, chronic ([place])   Heart failure ([place]) Fair  Discharge Condition: Fair  Diet recommendation: Low salt heart healthy  Filed Weights   05/29/19 0533 05/30/19 0433 05/31/19 0100  Weight: 78.6 kg 78 kg 78.9 kg    History of present illness:  33 white female tachybradycardia  syndrome + leadless PPM-? Battery failure CAD/stents Granulosa cell tumor of the ovary-under care of Dr. Simeon Craft such-now under consideration for focus on palliative and comfort at end-of-life per last office visit note 05/03/2019 CKD 3/3B with LBBB RAS PVD OSA  It appears was admitted 10 6-10/13 with AECHF persistent A. fib-cardiac cath 11/10/2018 with underlying three-vessel disease and a severely reduced LV systolic function [id number] was also cardioverted on 11/15/2022 persistent atrial fibrillation   Patient was seen at office visit with Roderic Palau and was out of rhythm she was feeling well and was told to continue dofetilide 125 mcg twice daily and metoprolol XL 200 daily it is noted in that note that she used nitroglycerin as well recently  Troponin was 53-->61-->98 proBNP 1018 procalcitonin 1.3 CXR?  Infiltrate versus not patient is compliant on her medications typically-is on goal-directed therapy ARB Imdur metoprolol EKG not really specific for ischemia but has pacer  Hospital Course:   Multifactorial dyspnea  Likely cause is decompensated right-sided heart failure-has a history of baseline NYHA class III dyspnea differential diagnosis right-sided pneumonia  Patient tells me PCP was treating her for 2 to 3 days in March with an antibiotic for possible pneumonia procalcitonin is less than 0.10, repeat CXR/26 suggested fluid overload-no antibiotics  Likely decompensated systolic heart failure  Change IV Lasix to p.o. Lasix 4/26-await cardiology input regarding dosing-held diuretics on 4/26-resume 80 in the morning 40 in the evening p.o. Lasix on discharge she is aware  Current meds Cozaar 50, metoprolol 200 XL, Imdur 90 (this is an increase made by cardiology this admission)  EF 40-45% moderately decreased  function per cardiology this represents no change-do not anticipate further work-up  Tachybradycardia syndrome QTC 540 at times-leadless pacemaker placement in the  past-chronic LBBB  QTC can go up to 600-continue dofetilide and other meds-rest as per cardiology-magnesium is corrected this admission added magnesium supplement  Continue anticoagulation with Coumadin  CKD 3 Developing metabolic alkalosis  Care with diuresis monitor trends of creatinine on a.m.  I think she has been over diuresed-holding diuretics-defer to cardiology  OSA on CPAP  Depending on echo and RSVP may need titration-will need to go home on CPAP  RAS  Continue Plavix 75 daily  Procedures: Echocardiogram as above Consultations:  Cardiology  Discharge Exam: Vitals:   05/30/19 2033 05/31/19 0359  BP: (!) 143/55 (!) 137/48  Pulse: 64 65  Resp: 19 16  Temp: 99.4 F (37.4 C) 98.6 F (37 C)  SpO2: 90% 90%    General: Awake alert coherent no distress EOMI NCAT frail pleasant Cardiovascular: S1-S2 no murmur paced rhythm on monitors she has paced rhythm Respiratory: Clinically clear no added sound no rales no rhonchi No lower extremity stasis dermatitis changes to lower extremities ROM intact moving all 4 limbs smile symmetric no JVD Abdomen soft no rebound  Discharge Instructions   Discharge Instructions    Diet - low sodium heart healthy   Complete by: As directed    Discharge instructions   Complete by: As directed    Look at your medication list carefully the dosage of your Imdur has changed and you will need to pick up a new strength of doses from your pharmacy [facility] I have stopped your calcium a bunch of your vitamins and Colace you can take the Colace if you really need it for constipation My recommendation is that you follow-up with Dr. Simeon Craft such and with palliative care in the outpatient setting to ensure that you have all your affairs in order I do not think you need certain medications going forward and this can be minimized further by palliative care to decrease your pill burden Good luck on your trip to the beach and enjoy yourself- take care    Increase activity slowly   Complete by: As directed      Allergies as of 05/31/2019      Reactions   Bee Venom Anaphylaxis   Other Nausea And Vomiting, Other (See Comments)   Pain medications cause severe vomiting. Tolerated slow IV morphine drip   Oxycodone Hcl Nausea And Vomiting   Amiodarone Nausea Only   Chlorhexidine    Prednisone Palpitations, Other (See Comments)   "Rapid Heart Beat"      Medication List    STOP taking these medications   Caltrate 600+D 600-800 MG-UNIT Tabs Generic drug: Calcium Carb-Cholecalciferol   docusate sodium 100 MG capsule Commonly known as: COLACE   Fish Oil 1200 MG Caps   losartan 50 MG tablet Commonly known as: COZAAR   multivitamin capsule     TAKE these medications   acetaminophen 500 MG tablet Commonly known as: TYLENOL Take 500 mg by mouth every 6 (six) hours as needed for mild pain or headache.   atorvastatin 80 MG tablet Commonly known as: LIPITOR TAKE 1 TABLET BY MOUTH EVERY DAY IN THE MORNING What changed: See the new instructions.   beta carotene w/minerals tablet Take 1 tablet by mouth 2 (two) times daily. What changed: Another medication with the same name was removed. Continue taking this medication, and follow the directions you see here.   clopidogrel 75 MG  tablet Commonly known as: PLAVIX TAKE 1 TABLET BY MOUTH EVERY DAY   Coenzyme Q-10 100 MG capsule Take 200 mg by mouth at bedtime.   dofetilide 125 MCG capsule Commonly known as: TIKOSYN Take 1 capsule (125 mcg total) by mouth 2 (two) times daily.   furosemide 40 MG tablet Commonly known as: LASIX Take 2 tablets by mouth every morning and 2 additional tabs as needed for wt gain/abdominal distension What changed:   how much to take  how to take this  when to take this   gabapentin 300 MG capsule Commonly known as: NEURONTIN TAKE 1 CAPSULE BY MOUTH TWICE A DAY   isosorbide mononitrate 60 MG 24 hr tablet Commonly known as: IMDUR Take 1.5 tablets  (90 mg total) by mouth daily. What changed: how much to take   lidocaine-prilocaine cream Commonly known as: EMLA Apply to PAC site 1-2 hours prior to access   magnesium oxide 400 (241.3 Mg) MG tablet Commonly known as: MAG-OX Take 2 tablets (800 mg total) by mouth daily.   metoprolol 200 MG 24 hr tablet Commonly known as: TOPROL-XL Take 1 tablet (200 mg total) by mouth daily. Take with or immediately following a meal.   nitroGLYCERIN 0.4 MG SL tablet Commonly known as: NITROSTAT PLACE 1 TABLET (0.4 MG TOTAL) UNDER THE TONGUE EVERY 5 (FIVE) MINUTES AS NEEDED FOR CHEST PAIN What changed:   how much to take  how to take this  when to take this  reasons to take this  additional instructions   potassium chloride SA 20 MEQ tablet Commonly known as: KLOR-CON Take 1 tablet (20 mEq total) by mouth 2 (two) times daily.   warfarin 2 MG tablet Commonly known as: COUMADIN Take as directed. If you are unsure how to take this medication, talk to your nurse or doctor. Original instructions: TAKE AS DIRECTED BY COUMADIN CLINIC What changed: See the new instructions.      Allergies  Allergen Reactions  . Bee Venom Anaphylaxis  . Other Nausea And Vomiting and Other (See Comments)    Pain medications cause severe vomiting. Tolerated slow IV morphine drip  . Oxycodone Hcl Nausea And Vomiting  . Amiodarone Nausea Only  . Chlorhexidine   . Prednisone Palpitations and Other (See Comments)    "Rapid Heart Beat"   Follow-up Information    [name], MD.   Specialty: Internal Medicine Contact information: [address] [place] [address] [phone]            The results of significant diagnostics from this hospitalization (including imaging, microbiology, ancillary and laboratory) are listed below for reference.    Significant Diagnostic Studies: DG Chest 2 View  Result Date: 05/30/2019 CLINICAL DATA:  Pneumonia with shortness of breath EXAM:  CHEST - 2 VIEW COMPARISON:  Yesterday FINDINGS: Improved aeration at the right base. Interlobular septal thickening and trace pleural effusions. There is a dual-chamber pacer from the left in unremarkable position. Implantable loop recorder. Port on the right with tip at the SVC. Cardiomegaly. Aortic and coronary atherosclerosis. IMPRESSION: 1. Improved right lower lobe aeration. 2. Cardiomegaly and mild interstitial edema. Electronically Signed   By: Monte Fantasia M.D.   On: 05/30/2019 07:13   DG Chest 2 View  Result Date: 05/29/2019 CLINICAL DATA:  Chest pain. EXAM: CHEST - 2 VIEW COMPARISON:  May 28, 2019 FINDINGS: New opacity in the right base. There may be a small associated effusion. Left retrocardiac opacity remains. Stable cardiomegaly. Stable right Port-A-Cath  and pacemaker. No pneumothorax. No other abnormalities. IMPRESSION: 1. New opacity in the right base may represent pneumonia or aspiration. Given the associated effusion, compressive atelectasis is possible. Recommend clinical correlation. Stable left retrocardiac opacity. Electronically Signed   By: Dorise Bullion III M.D   On: 05/29/2019 13:32   DG Chest 2 View  Result Date: 05/28/2019 CLINICAL DATA:  Chest pain. EXAM: CHEST - 2 VIEW COMPARISON:  March 08, 2019 FINDINGS: The there is stable right-sided venous Port-A-Cath positioning. A dual lead AICD is noted. Mild atelectasis and/or infiltrate is seen within the retrocardiac region of the left lung base. There is no evidence of a pleural effusion or pneumothorax. The cardiac silhouette is markedly enlarged and unchanged in size. There is marked severity calcification of the aortic arch. The visualized skeletal structures are unremarkable. IMPRESSION: Mild left basilar atelectasis and/or infiltrate. Electronically Signed   By: Virgina Norfolk M.D.   On: 05/28/2019 01:43   CUP PACEART REMOTE DEVICE CHECK  Result Date: 05/09/2019 Scheduled remote reviewed. Normal device function.   23 AT/AF episodes since last remote. Longest lasting 19 min 38 sec with controlled VR. AF burden 6.2%. On Coumadin. Programmed DDIR. Next remote 91 days.  LHumphrey  ECHOCARDIOGRAM LIMITED  Result Date: 05/29/2019    ECHOCARDIOGRAM LIMITED REPORT   Patient Name:   Patty [name] [place] Date of Exam: 05/29/2019 Medical Rec #:  [id number]       Height:       60.0 in Accession #:    [id number]      Weight:       173.2 lb Date of Birth:  [birth year]       BSA:          1.756 m Patient Age:    86 years        BP:           122/56 mmHg Patient Gender: F               HR:           84 bpm. Exam Location:  Inpatient Procedure: Limited Echo, Cardiac Doppler, Color Doppler and Intracardiac            Opacification Agent                                MODIFIED REPORT:  This report was modified by Cherlynn Kaiser MD on 05/29/2019 due to no changes                                      made.  Indications:     Chest Pain 786.50/ R07.9  History:         Patient has prior history of Echocardiogram examinations, most                  recent 11/12/2018. CAD, Pacemaker, Arrythmias:Atrial                  Fibrillation, Atrial Flutter and non-specific ST changes,                  Signs/Symptoms:Chest Pain; Risk Factors:Hypertension,                  Dyslipidemia and Former Smoker. DOE.  Sonographer:     Vickie Epley RDCS Referring Phys:  [id number] Elouise Munroe Diagnosing  Phys: Cherlynn Kaiser MD IMPRESSIONS  1. Left ventricular ejection fraction, by estimation, is 40 to 45%. The left ventricle has moderately decreased function. The left ventricle demonstrates regional wall motion abnormalities (see scoring diagram/findings for description). There is severe left ventricular hypertrophy. Left ventricular diastolic parameters are indeterminate.  2. Right ventricular systolic function is mildly reduced. The right ventricular size is normal.  3. Left atrial size was moderately dilated.  4. The mitral valve is degenerative. Trivial mitral valve  regurgitation.  5. The aortic valve is abnormal. Aortic valve regurgitation is mild. Mild aortic valve sclerosis is present, with no evidence of aortic valve stenosis.  6. The inferior vena cava is normal in size with greater than 50% respiratory variability, suggesting right atrial pressure of 3 mmHg. Comparison(s): A prior study was performed on 11/12/2018. No significant change from prior study. FINDINGS  Left Ventricle: Left ventricular ejection fraction, by estimation, is 40 to 45%. The left ventricle has moderately decreased function. The left ventricle demonstrates regional wall motion abnormalities. Definity contrast agent was given IV to delineate the left ventricular endocardial borders. There is severe left ventricular hypertrophy. Abnormal (paradoxical) septal motion, consistent with left bundle branch block.  LV Wall Scoring: The apical septal segment is akinetic. The mid inferoseptal segment is hypokinetic. Right Ventricle: The right ventricular size is normal. Right ventricular systolic function is mildly reduced. Left Atrium: Left atrial size was moderately dilated. Mitral Valve: The mitral valve is degenerative in appearance. Mild to moderate mitral annular calcification. Trivial mitral valve regurgitation. Aortic Valve: The aortic valve is abnormal. Aortic valve regurgitation is mild. Mild aortic valve sclerosis is present, with no evidence of aortic valve stenosis. There is moderate calcification of the aortic valve. Aortic valve mean gradient measures 6.6 mmHg. Aortic valve peak gradient measures 14.2 mmHg. Aortic valve area, by VTI measures 1.56 cm. Pulmonic Valve: The pulmonic valve was normal in structure. Pulmonic valve regurgitation is trivial. Venous: The inferior vena cava is normal in size with greater than 50% respiratory variability, suggesting right atrial pressure of 3 mmHg.  LEFT VENTRICLE PLAX 2D LVIDd:         4.20 cm LVIDs:         3.30 cm LV PW:         1.60 cm LV IVS:         1.60 cm LVOT diam:     1.90 cm LV SV:         54 LV SV Index:   31 LVOT Area:     2.84 cm  LEFT ATRIUM         Index LA diam:    4.90 cm 2.79 cm/m  AORTIC VALVE AV Area (Vmax):    1.47 cm AV Area (Vmean):   1.61 cm AV Area (VTI):     1.56 cm AV Vmax:           188.48 cm/s AV Vmean:          116.860 cm/s AV VTI:            0.349 m AV Peak Grad:      14.2 mmHg AV Mean Grad:      6.6 mmHg LVOT Vmax:         97.90 cm/s LVOT Vmean:        66.300 cm/s LVOT VTI:          0.192 m LVOT/AV VTI ratio: 0.55  AORTA Ao Root diam: 3.00 cm  SHUNTS Systemic VTI:  0.19  m Systemic Diam: 1.90 cm Cherlynn Kaiser MD Electronically signed by Cherlynn Kaiser MD Signature Date/Time: 05/29/2019/12:00:41 PM    Final (Updated)     Microbiology: Recent Results (from the past 240 hour(s))  Respiratory Panel by RT PCR (Flu A&B, Covid) - Nasopharyngeal Swab     Status: None   Collection Time: 05/28/19  4:02 AM   Specimen: Nasopharyngeal Swab  Result Value Ref Range Status   SARS Coronavirus 2 by RT PCR NEGATIVE NEGATIVE Final    Comment: (NOTE) SARS-CoV-2 target nucleic acids are NOT DETECTED. The SARS-CoV-2 RNA is generally detectable in upper respiratoy specimens during the acute phase of infection. The lowest concentration of SARS-CoV-2 viral copies this assay can detect is 131 copies/mL. A negative result does not preclude SARS-Cov-2 infection and should not be used as the sole basis for treatment or other patient management decisions. A negative result may occur with  improper specimen collection/handling, submission of specimen other than nasopharyngeal swab, presence of viral mutation(s) within the areas targeted by this assay, and inadequate number of viral copies (<131 copies/mL). A negative result must be combined with clinical observations, patient history, and epidemiological information. The expected result is Negative. Fact Sheet for Patients:  PinkCheek.be Fact Sheet for  Healthcare Providers:  GravelBags.it This test is not yet ap proved or cleared by the [place] FDA and  has been authorized for detection and/or diagnosis of SARS-CoV-2 by FDA under an Emergency Use Authorization (EUA). This EUA will remain  in effect (meaning this test can be used) for the duration of the COVID-19 declaration under Section 564(b)(1) of the Act, 21 U.S.C. section 360bbb-3(b)(1), unless the authorization is terminated or revoked sooner.    Influenza A by PCR NEGATIVE NEGATIVE Final   Influenza B by PCR NEGATIVE NEGATIVE Final    Comment: (NOTE) The Xpert Xpress SARS-CoV-2/FLU/RSV assay is intended as an aid in  the diagnosis of influenza from Nasopharyngeal swab specimens and  should not be used as a sole basis for treatment. Nasal washings and  aspirates are unacceptable for Xpert Xpress SARS-CoV-2/FLU/RSV  testing. Fact Sheet for Patients: PinkCheek.be Fact Sheet for Healthcare Providers: GravelBags.it This test is not yet approved or cleared by the [place] FDA and  has been authorized for detection and/or diagnosis of SARS-CoV-2 by  FDA under an Emergency Use Authorization (EUA). This EUA will remain  in effect (meaning this test can be used) for the duration of the  Covid-19 declaration under Section 564(b)(1) of the Act, 21  U.S.C. section 360bbb-3(b)(1), unless the authorization is  terminated or revoked. Performed at [name], [place] [address], [place], [place] [address]      Labs: Basic Metabolic Panel: Recent Labs  Lab 05/26/19 1549 05/28/19 0028 05/28/19 0453 05/29/19 0622 05/30/19 0453 05/31/19 0433  NA 142 145  --  146* 146* 141  K 3.9 3.8  --  4.5 3.7 3.5  CL 102 105  --  108 104 106  CO2 29 30  --  29 33* 29  GLUCOSE 106* 118*  --  102* 116* 106*  BUN 26* 30*  --  30* 32* 25*  CREATININE 0.99 1.05*  --  1.14* 1.24* 1.13*  CALCIUM 8.9  8.7*  --  8.4* 8.2* 7.7*  MG 2.0  --  1.9 2.2 1.9 2.0   Liver Function Tests: Recent Labs  Lab 05/29/19 0622 05/30/19 0453 05/31/19 0433  AST 41 35 29  ALT 31 28 22   ALKPHOS 49 59  56  BILITOT 1.2 1.1 1.4*  PROT 5.5* 6.2* 5.4*  ALBUMIN 2.5* 2.9* 2.5*   No results for input(s): LIPASE, AMYLASE in the last 168 hours. No results for input(s): AMMONIA in the last 168 hours. CBC: Recent Labs  Lab 05/28/19 0028 05/29/19 0622 05/30/19 0453 05/31/19 0433  WBC 5.5 5.7 8.0 9.3  NEUTROABS  --  3.3 5.4 6.6  HGB 13.0 12.8 13.1 11.7*  HCT 40.9 40.3 41.4 36.9  MCV 100.2* 101.8* 100.7* 100.5*  PLT 158 157 152 137*   Cardiac Enzymes: No results for input(s): CKTOTAL, CKMB, CKMBINDEX, TROPONINI in the last 168 hours. BNP: BNP (last 3 results) Recent Labs    10/08/18 1407 11/09/18 1041 05/28/19 0453  BNP 707.8* 664.9* 1,018.0*    ProBNP (last 3 results) Recent Labs    03/08/19 1610 03/22/19 1502  PROBNP 7,514* 3,420*    CBG: No results for input(s): GLUCAP in the last 168 hours.     Signed:  Nita Sells MD   [facility] 05/31/2019, 7:57 AM

## 2019-05-31 NOTE — TOC Initial Note (Signed)
Transition of Care [facility]) - Initial/Assessment Note    Patient Details  Name: Patty [name] MRN: [id number] Date of Birth: April 04, 1930  Transition of Care [facility]) CM/SW Contact:    Zenon Mayo, RN Phone Number: 05/31/2019, 8:20 AM  Clinical Narrative:                 NCM spoke with patient, she states she has transportation home today, she does not have any DME needs or HH needs, she states she is indep and still drives and has no issues with getting her medications..  She states she is with [facility].  NCM contacted Sheree with [facility] to let her know patient is for dc today.   Expected Discharge Plan: Home/Self Care Barriers to Discharge: No Barriers Identified   Patient Goals and CMS Choice Patient states their goals for this hospitalization and ongoing recovery are:: get better   Choice offered to / list presented to : NA  Expected Discharge Plan and Services Expected Discharge Plan: Home/Self Care   Discharge Planning Services: CM Consult Post Acute Care Choice: NA Living arrangements for the past 2 months: Single Family Home Expected Discharge Date: 05/31/19                 DME Agency: NA                  Prior Living Arrangements/Services Living arrangements for the past 2 months: Single Family Home Lives with:: Self Patient language and need for interpreter reviewed:: Yes Do you feel safe going back to the place where you live?: Yes      Need for Family Participation in Patient Care: No (Comment) Care giver support system in place?: No (comment)   Criminal Activity/Legal Involvement Pertinent to Current Situation/Hospitalization: No - Comment as needed  Activities of Daily Living Home Assistive Devices/Equipment: CPAP, Walker (specify type) ADL Screening (condition at time of admission) Patient's cognitive ability adequate to safely complete daily activities?: Yes Is the patient deaf or have difficulty  hearing?: No Does the patient have difficulty seeing, even when wearing glasses/contacts?: No Does the patient have difficulty concentrating, remembering, or making decisions?: No Patient able to express need for assistance with ADLs?: Yes Does the patient have difficulty dressing or bathing?: No Independently performs ADLs?: Yes (appropriate for developmental age) Does the patient have difficulty walking or climbing stairs?: Yes Weakness of Legs: Both Weakness of Arms/Hands: None  Permission Sought/Granted                  Emotional Assessment   Attitude/Demeanor/Rapport: Engaged Affect (typically observed): Appropriate Orientation: : Oriented to Self, Oriented to Place, Oriented to  Time, Oriented to Situation Alcohol / Substance Use: Not Applicable Psych Involvement: No (comment)  Admission diagnosis:  Unstable angina ([place]) [I20.0] Heart failure ([place]) [I50.9] Chest pain [R07.9] Patient Active Problem List   Diagnosis Date Noted  . Heart failure ([place]) 05/29/2019  . Atrial fibrillation, chronic ([place]) 05/28/2019  . Stable angina ([place]) 10/09/2018  . Coagulopathy ([place]) 10/09/2018  . Acute on chronic combined systolic and diastolic CHF (congestive heart failure) ([place]) 10/08/2018  . Peripheral neuropathy due to chemotherapy ([place]) 11/19/2017  . Pancytopenia, acquired ([place]) 11/19/2017  . Acute on chronic combined systolic (congestive) and diastolic (congestive) heart failure ([place]) 11/03/2017  . Hyperglycemia 11/03/2017  . Protein-calorie malnutrition, moderate ([place]) 10/28/2017  . Other constipation 10/20/2017  . Postherpetic neuralgia 10/20/2017  . Cancer associated pain 10/20/2017  . Physical  debility 10/20/2017  . Goals of care, counseling/discussion 10/20/2017  . Ovarian cancer ([place]) 10/19/2017  . Intraperitoneal hemorrhage   . Abdominal pain 10/12/2017  . Pain of joint of left ankle and foot 08/03/2017  . Persistent atrial fibrillation   . Warfarin anticoagulation    . DOE (dyspnea on exertion)   . Status post coronary artery stent placement   . Normocytic anemia 07/11/2016  . History of small bowel obstruction 12/22/2015  . Permanent atrial fibrillation with RVR 12/15/2015  . Demand ischemia ([place]) 12/15/2015  . NSTEMI (non-ST elevated myocardial infarction) ([place]) 05/18/2015  . Chronic anticoagulation - coumadin, CHADS2VASC=6 05/17/2015  . Unstable angina ([place]) 05/17/2015  . Swelling of lower extremity 04/18/2015  . Encounter for therapeutic drug monitoring 08/25/2014  . 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[place]) 06/16/2014  . Ischemic chest pain ([place])   . OSA on CPAP 05/14/2014  . Chest pain 01/05/2014  . Atrial flutter ([place])   . Hypokalemia   . Pacemaker - St Jude Leadless PPM   . Combined systolic and diastolic heart failure, NYHA class 3 ([place])   . Tachycardia-bradycardia syndrome ([place]) 09/30/2012  . Atrial fibrillation with RVR ([place]) 12/28/2011  . Ventral hernia 10/16/2011  . Granulosa cell tumor of ovary 04/23/2011  . CAD (coronary artery disease) 10/03/2010  . Essential hypertension 10/03/2010  . Hyperlipidemia 10/03/2010  . Renal artery stenosis ([place]) 10/03/2010  . Obesity (BMI 30.0-34.9) 10/03/2010   PCP:  [name], MD Pharmacy:   [id number] - [place], [place] [address] [place] [address] Phone: [phone] Fax: [phone]  [id number] [place], [place] [address] [place] [address] Phone: [phone] Fax: [phone]     Social Determinants of Health (SDOH) Interventions    Readmission Risk Interventions Readmission Risk Prevention Plan 05/31/2019  Transportation Screening Complete  Medication Review Press photographer) Complete  PCP or Specialist appointment within 3-5 days of discharge Complete  HRI or [place] Complete  SW Recovery Care/Counseling Consult Complete  Palliative Care  Screening Complete  [place] Not Applicable  Some recent data might be hidden

## 2019-05-31 NOTE — Plan of Care (Signed)

## 2019-06-03 ENCOUNTER — Ambulatory Visit: Payer: Medicare Other | Admitting: Podiatry

## 2019-06-06 ENCOUNTER — Telehealth: Payer: Self-pay | Admitting: Interventional Cardiology

## 2019-06-06 NOTE — Telephone Encounter (Signed)
New Message  Received a call from the pt. Pt states that she needs a visit follow up asap with dr. Irish Lack because she is having heart issues. States that she would like for [place] to give her a call back because of her ability to help her out. Informed her that his next available appt is on 06/23/19.

## 2019-06-08 ENCOUNTER — Encounter (HOSPITAL_COMMUNITY): Payer: Self-pay | Admitting: Emergency Medicine

## 2019-06-08 ENCOUNTER — Other Ambulatory Visit: Payer: Self-pay

## 2019-06-08 ENCOUNTER — Inpatient Hospital Stay (HOSPITAL_COMMUNITY)
Admission: EM | Admit: 2019-06-08 | Discharge: 2019-06-15 | DRG: 291 | Disposition: A | Discharge status: Hospice | Payer: Medicare Other | Attending: Internal Medicine | Admitting: Internal Medicine

## 2019-06-08 DIAGNOSIS — Z743 Need for continuous supervision: Secondary | ICD-10-CM | POA: Diagnosis not present

## 2019-06-08 DIAGNOSIS — R079 Chest pain, unspecified: Secondary | ICD-10-CM | POA: Diagnosis not present

## 2019-06-08 DIAGNOSIS — Z7901 Long term (current) use of anticoagulants: Secondary | ICD-10-CM

## 2019-06-08 DIAGNOSIS — R0600 Dyspnea, unspecified: Secondary | ICD-10-CM

## 2019-06-08 DIAGNOSIS — Z91030 Bee allergy status: Secondary | ICD-10-CM

## 2019-06-08 DIAGNOSIS — R0789 Other chest pain: Secondary | ICD-10-CM | POA: Diagnosis not present

## 2019-06-08 DIAGNOSIS — Z833 Family history of diabetes mellitus: Secondary | ICD-10-CM

## 2019-06-08 DIAGNOSIS — I4821 Permanent atrial fibrillation: Secondary | ICD-10-CM | POA: Diagnosis not present

## 2019-06-08 DIAGNOSIS — R072 Precordial pain: Secondary | ICD-10-CM | POA: Diagnosis not present

## 2019-06-08 DIAGNOSIS — Z8249 Family history of ischemic heart disease and other diseases of the circulatory system: Secondary | ICD-10-CM

## 2019-06-08 DIAGNOSIS — I130 Hypertensive heart and chronic kidney disease with heart failure and stage 1 through stage 4 chronic kidney disease, or unspecified chronic kidney disease: Principal | ICD-10-CM | POA: Diagnosis present

## 2019-06-08 DIAGNOSIS — I5043 Acute on chronic combined systolic (congestive) and diastolic (congestive) heart failure: Secondary | ICD-10-CM | POA: Diagnosis present

## 2019-06-08 DIAGNOSIS — I10 Essential (primary) hypertension: Secondary | ICD-10-CM | POA: Diagnosis present

## 2019-06-08 DIAGNOSIS — Z87891 Personal history of nicotine dependence: Secondary | ICD-10-CM

## 2019-06-08 DIAGNOSIS — I249 Acute ischemic heart disease, unspecified: Secondary | ICD-10-CM

## 2019-06-08 DIAGNOSIS — R7989 Other specified abnormal findings of blood chemistry: Secondary | ICD-10-CM | POA: Diagnosis not present

## 2019-06-08 DIAGNOSIS — I4891 Unspecified atrial fibrillation: Secondary | ICD-10-CM | POA: Diagnosis not present

## 2019-06-08 DIAGNOSIS — R0602 Shortness of breath: Secondary | ICD-10-CM | POA: Diagnosis not present

## 2019-06-08 DIAGNOSIS — Z841 Family history of disorders of kidney and ureter: Secondary | ICD-10-CM

## 2019-06-08 DIAGNOSIS — I482 Chronic atrial fibrillation, unspecified: Secondary | ICD-10-CM | POA: Diagnosis present

## 2019-06-08 DIAGNOSIS — E669 Obesity, unspecified: Secondary | ICD-10-CM | POA: Diagnosis present

## 2019-06-08 DIAGNOSIS — Z20822 Contact with and (suspected) exposure to covid-19: Secondary | ICD-10-CM | POA: Diagnosis not present

## 2019-06-08 DIAGNOSIS — I2510 Atherosclerotic heart disease of native coronary artery without angina pectoris: Secondary | ICD-10-CM | POA: Diagnosis present

## 2019-06-08 DIAGNOSIS — Z6833 Body mass index (BMI) 33.0-33.9, adult: Secondary | ICD-10-CM

## 2019-06-08 DIAGNOSIS — R778 Other specified abnormalities of plasma proteins: Secondary | ICD-10-CM

## 2019-06-08 DIAGNOSIS — Z888 Allergy status to other drugs, medicaments and biological substances status: Secondary | ICD-10-CM

## 2019-06-08 DIAGNOSIS — I739 Peripheral vascular disease, unspecified: Secondary | ICD-10-CM | POA: Diagnosis present

## 2019-06-08 DIAGNOSIS — I447 Left bundle-branch block, unspecified: Secondary | ICD-10-CM | POA: Diagnosis present

## 2019-06-08 DIAGNOSIS — I471 Supraventricular tachycardia: Secondary | ICD-10-CM | POA: Diagnosis not present

## 2019-06-08 DIAGNOSIS — C569 Malignant neoplasm of unspecified ovary: Secondary | ICD-10-CM | POA: Diagnosis present

## 2019-06-08 DIAGNOSIS — Z66 Do not resuscitate: Secondary | ICD-10-CM | POA: Diagnosis present

## 2019-06-08 DIAGNOSIS — Z955 Presence of coronary angioplasty implant and graft: Secondary | ICD-10-CM

## 2019-06-08 DIAGNOSIS — I495 Sick sinus syndrome: Secondary | ICD-10-CM | POA: Diagnosis present

## 2019-06-08 DIAGNOSIS — I429 Cardiomyopathy, unspecified: Secondary | ICD-10-CM | POA: Diagnosis present

## 2019-06-08 DIAGNOSIS — I4892 Unspecified atrial flutter: Secondary | ICD-10-CM | POA: Diagnosis not present

## 2019-06-08 DIAGNOSIS — G4733 Obstructive sleep apnea (adult) (pediatric): Secondary | ICD-10-CM | POA: Diagnosis present

## 2019-06-08 DIAGNOSIS — K5790 Diverticulosis of intestine, part unspecified, without perforation or abscess without bleeding: Secondary | ICD-10-CM | POA: Diagnosis present

## 2019-06-08 DIAGNOSIS — I248 Other forms of acute ischemic heart disease: Secondary | ICD-10-CM | POA: Diagnosis not present

## 2019-06-08 DIAGNOSIS — R0609 Other forms of dyspnea: Secondary | ICD-10-CM

## 2019-06-08 DIAGNOSIS — N183 Chronic kidney disease, stage 3 unspecified: Secondary | ICD-10-CM | POA: Diagnosis present

## 2019-06-08 DIAGNOSIS — Z823 Family history of stroke: Secondary | ICD-10-CM

## 2019-06-08 DIAGNOSIS — Z45018 Encounter for adjustment and management of other part of cardiac pacemaker: Secondary | ICD-10-CM

## 2019-06-08 DIAGNOSIS — Z7902 Long term (current) use of antithrombotics/antiplatelets: Secondary | ICD-10-CM

## 2019-06-08 DIAGNOSIS — E785 Hyperlipidemia, unspecified: Secondary | ICD-10-CM | POA: Diagnosis present

## 2019-06-08 DIAGNOSIS — Z885 Allergy status to narcotic agent status: Secondary | ICD-10-CM

## 2019-06-08 DIAGNOSIS — I252 Old myocardial infarction: Secondary | ICD-10-CM

## 2019-06-08 MED ORDER — SODIUM CHLORIDE 0.9% FLUSH
3.0000 mL | Freq: Once | INTRAVENOUS | Status: AC
  Administered 2019-06-09: 3 mL via INTRAVENOUS

## 2019-06-08 NOTE — Telephone Encounter (Signed)
Patient was calling to make sure Patty [name] got her message from Monday. She asks that if Patty [name] can not reach her to please leave a message because she gets lots of spam calls every day

## 2019-06-08 NOTE — Telephone Encounter (Signed)
I have been out of the office for the past 2 days.  Returned call to the patient. She states that she was in the hospital at the end of April for chest pain, SOB, and elevated troponin. She states that she is calling to schedule her hospital f/u as she was instructed to do. She states that she continues to have some SOB but not as bad as it was when she went to the hospital. She states that it is worse in the morning and resolves through out the day. Weight has been stable. Denies swelling. Appointment made on 5/11. Instructed patient to let us know if her Sx worsen.

## 2019-06-08 NOTE — ED Triage Notes (Addendum)
Per EMS, pt from home, began to experience chest pressure and SOB upon exertion.  Pt "lives in AFIB and I have a pacemaker."  Does have some bilateral lower leg edema.    120 bp palpitated 101 HR 18RR 97%RA 152 CBG

## 2019-06-09 ENCOUNTER — Emergency Department (HOSPITAL_COMMUNITY): Payer: Medicare Other

## 2019-06-09 DIAGNOSIS — C569 Malignant neoplasm of unspecified ovary: Secondary | ICD-10-CM | POA: Diagnosis not present

## 2019-06-09 DIAGNOSIS — I5043 Acute on chronic combined systolic (congestive) and diastolic (congestive) heart failure: Secondary | ICD-10-CM | POA: Diagnosis not present

## 2019-06-09 DIAGNOSIS — I471 Supraventricular tachycardia: Secondary | ICD-10-CM | POA: Diagnosis not present

## 2019-06-09 DIAGNOSIS — I4821 Permanent atrial fibrillation: Secondary | ICD-10-CM | POA: Diagnosis not present

## 2019-06-09 DIAGNOSIS — I4892 Unspecified atrial flutter: Secondary | ICD-10-CM

## 2019-06-09 DIAGNOSIS — I248 Other forms of acute ischemic heart disease: Secondary | ICD-10-CM | POA: Diagnosis not present

## 2019-06-09 DIAGNOSIS — I249 Acute ischemic heart disease, unspecified: Secondary | ICD-10-CM | POA: Diagnosis not present

## 2019-06-09 DIAGNOSIS — Z20822 Contact with and (suspected) exposure to covid-19: Secondary | ICD-10-CM | POA: Diagnosis not present

## 2019-06-09 DIAGNOSIS — I429 Cardiomyopathy, unspecified: Secondary | ICD-10-CM | POA: Diagnosis not present

## 2019-06-09 DIAGNOSIS — R7989 Other specified abnormal findings of blood chemistry: Secondary | ICD-10-CM

## 2019-06-09 DIAGNOSIS — I130 Hypertensive heart and chronic kidney disease with heart failure and stage 1 through stage 4 chronic kidney disease, or unspecified chronic kidney disease: Secondary | ICD-10-CM | POA: Diagnosis not present

## 2019-06-09 DIAGNOSIS — I2510 Atherosclerotic heart disease of native coronary artery without angina pectoris: Secondary | ICD-10-CM | POA: Diagnosis not present

## 2019-06-09 DIAGNOSIS — I4891 Unspecified atrial fibrillation: Secondary | ICD-10-CM

## 2019-06-09 DIAGNOSIS — Z955 Presence of coronary angioplasty implant and graft: Secondary | ICD-10-CM | POA: Diagnosis not present

## 2019-06-09 DIAGNOSIS — Z66 Do not resuscitate: Secondary | ICD-10-CM | POA: Diagnosis not present

## 2019-06-09 DIAGNOSIS — I4819 Other persistent atrial fibrillation: Secondary | ICD-10-CM

## 2019-06-09 DIAGNOSIS — R079 Chest pain, unspecified: Secondary | ICD-10-CM | POA: Diagnosis not present

## 2019-06-09 LAB — BASIC METABOLIC PANEL
Anion gap: 12 (ref 5–15)
Anion gap: 10 (ref 5–15)
BUN: 21 mg/dL (ref 8–23)
BUN: 20 mg/dL (ref 8–23)
CO2: 30 mmol/L (ref 22–32)
CO2: 34 mmol/L — ABNORMAL HIGH (ref 22–32)
Calcium: 8.7 mg/dL — ABNORMAL LOW (ref 8.9–10.3)
Calcium: 8.3 mg/dL — ABNORMAL LOW (ref 8.9–10.3)
Chloride: 100 mmol/L (ref 98–111)
Chloride: 99 mmol/L (ref 98–111)
Creatinine, Ser: 1.13 mg/dL — ABNORMAL HIGH (ref 0.44–1.00)
Creatinine, Ser: 1.09 mg/dL — ABNORMAL HIGH (ref 0.44–1.00)
GFR calc Af Amer: 51 mL/min — ABNORMAL LOW (ref >60)
GFR calc Af Amer: 53 mL/min — ABNORMAL LOW (ref >60)
GFR calc non Af Amer: 44 mL/min — ABNORMAL LOW (ref >60)
GFR calc non Af Amer: 46 mL/min — ABNORMAL LOW (ref >60)
Glucose, Bld: 122 mg/dL — ABNORMAL HIGH (ref 70–99)
Glucose, Bld: 170 mg/dL — ABNORMAL HIGH (ref 70–99)
Potassium: 3.9 mmol/L (ref 3.5–5.1)
Potassium: 3.5 mmol/L (ref 3.5–5.1)
Sodium: 142 mmol/L (ref 135–145)
Sodium: 143 mmol/L (ref 135–145)

## 2019-06-09 LAB — BRAIN NATRIURETIC PEPTIDE: B Natriuretic Peptide: 1092.6 pg/mL — ABNORMAL HIGH (ref 0.0–100.0)

## 2019-06-09 LAB — CBC
HCT: 41.4 % (ref 36.0–46.0)
Hemoglobin: 12.8 g/dL (ref 12.0–15.0)
MCH: 30.8 pg (ref 26.0–34.0)
MCHC: 30.9 g/dL (ref 30.0–36.0)
MCV: 99.5 fL (ref 80.0–100.0)
Platelets: 309 10*3/uL (ref 150–400)
RBC: 4.16 MIL/uL (ref 3.87–5.11)
RDW: 14.6 % (ref 11.5–15.5)
WBC: 5.9 10*3/uL (ref 4.0–10.5)
nRBC: 0 % (ref 0.0–0.2)

## 2019-06-09 LAB — PROTIME-INR
INR: 5 (ref 0.8–1.2)
INR: 5.1 (ref 0.8–1.2)
INR: 5.4 (ref 0.8–1.2)
Prothrombin Time: 45 seconds — ABNORMAL HIGH (ref 11.4–15.2)
Prothrombin Time: 45.7 seconds — ABNORMAL HIGH (ref 11.4–15.2)
Prothrombin Time: 48.1 seconds — ABNORMAL HIGH (ref 11.4–15.2)

## 2019-06-09 LAB — RESPIRATORY PANEL BY RT PCR (FLU A&B, COVID)
Influenza A by PCR: NEGATIVE
Influenza B by PCR: NEGATIVE
SARS Coronavirus 2 by RT PCR: NEGATIVE

## 2019-06-09 LAB — MAGNESIUM: Magnesium: 2.1 mg/dL (ref 1.7–2.4)

## 2019-06-09 LAB — TROPONIN I (HIGH SENSITIVITY)
Troponin I (High Sensitivity): 237 ng/L (ref <18)
Troponin I (High Sensitivity): 244 ng/L (ref <18)

## 2019-06-09 MED ORDER — MAGNESIUM OXIDE 400 (241.3 MG) MG PO TABS
800.0000 mg | ORAL_TABLET | Freq: Every day | ORAL | Status: DC
  Administered 2019-06-09 – 2019-06-15 (×7): 800 mg via ORAL
  Filled 2019-06-09 (×7): qty 2

## 2019-06-09 MED ORDER — METOPROLOL TARTRATE 25 MG/10 ML ORAL SUSPENSION
25.0000 mg | Freq: Once | ORAL | Status: AC
  Administered 2019-06-09: 25 mg via ORAL
  Filled 2019-06-09: qty 10

## 2019-06-09 MED ORDER — DOFETILIDE 125 MCG PO CAPS
125.0000 ug | ORAL_CAPSULE | Freq: Two times a day (BID) | ORAL | Status: DC
  Administered 2019-06-09 – 2019-06-15 (×12): 125 ug via ORAL
  Filled 2019-06-09 (×13): qty 1

## 2019-06-09 MED ORDER — ASPIRIN 81 MG PO CHEW
324.0000 mg | CHEWABLE_TABLET | Freq: Once | ORAL | Status: AC
  Administered 2019-06-09: 324 mg via ORAL
  Filled 2019-06-09: qty 4

## 2019-06-09 MED ORDER — ACETAMINOPHEN 650 MG RE SUPP: 650.0000 mg | Freq: Four times a day (QID) | RECTAL | Status: DC | PRN

## 2019-06-09 MED ORDER — NITROGLYCERIN 0.4 MG SL SUBL: 0.4000 mg | SUBLINGUAL_TABLET | SUBLINGUAL | Status: DC | PRN

## 2019-06-09 MED ORDER — MAGNESIUM SULFATE 2 GM/50ML IV SOLN
2.0000 g | Freq: Once | INTRAVENOUS | Status: AC
  Administered 2019-06-09: 2 g via INTRAVENOUS
  Filled 2019-06-09: qty 50

## 2019-06-09 MED ORDER — ISOSORBIDE MONONITRATE ER 60 MG PO TB24
90.0000 mg | ORAL_TABLET | Freq: Every day | ORAL | Status: DC
  Administered 2019-06-09 – 2019-06-15 (×7): 90 mg via ORAL
  Filled 2019-06-09 (×3): qty 1
  Filled 2019-06-09: qty 3
  Filled 2019-06-09 (×3): qty 1

## 2019-06-09 MED ORDER — COENZYME Q-10 100 MG PO CAPS: 200.0000 mg | ORAL_CAPSULE | Freq: Every day | ORAL | Status: DC

## 2019-06-09 MED ORDER — GABAPENTIN 300 MG PO CAPS
300.0000 mg | ORAL_CAPSULE | Freq: Two times a day (BID) | ORAL | Status: DC
  Administered 2019-06-09 – 2019-06-15 (×13): 300 mg via ORAL
  Filled 2019-06-09 (×6): qty 1
  Filled 2019-06-09: qty 3
  Filled 2019-06-09 (×6): qty 1

## 2019-06-09 MED ORDER — METOPROLOL SUCCINATE ER 100 MG PO TB24
200.0000 mg | ORAL_TABLET | Freq: Every day | ORAL | Status: DC
  Administered 2019-06-10 – 2019-06-15 (×6): 200 mg via ORAL
  Filled 2019-06-09 (×6): qty 2

## 2019-06-09 MED ORDER — ATORVASTATIN CALCIUM 80 MG PO TABS
80.0000 mg | ORAL_TABLET | Freq: Every day | ORAL | Status: DC
  Administered 2019-06-09 – 2019-06-15 (×7): 80 mg via ORAL
  Filled 2019-06-09 (×7): qty 1

## 2019-06-09 MED ORDER — POTASSIUM CHLORIDE CRYS ER 20 MEQ PO TBCR
20.0000 meq | EXTENDED_RELEASE_TABLET | Freq: Two times a day (BID) | ORAL | Status: DC
  Administered 2019-06-09 – 2019-06-13 (×9): 20 meq via ORAL
  Filled 2019-06-09 (×4): qty 1
  Filled 2019-06-09: qty 2
  Filled 2019-06-09 (×4): qty 1

## 2019-06-09 MED ORDER — FUROSEMIDE 10 MG/ML IJ SOLN
40.0000 mg | Freq: Every day | INTRAMUSCULAR | Status: DC
  Administered 2019-06-10: 40 mg via INTRAVENOUS
  Filled 2019-06-09 (×2): qty 4

## 2019-06-09 MED ORDER — FUROSEMIDE 10 MG/ML IJ SOLN
60.0000 mg | Freq: Once | INTRAMUSCULAR | Status: AC
  Administered 2019-06-09: 60 mg via INTRAVENOUS
  Filled 2019-06-09: qty 6

## 2019-06-09 MED ORDER — ONDANSETRON HCL 4 MG/2ML IJ SOLN: 4.0000 mg | Freq: Four times a day (QID) | INTRAMUSCULAR | Status: DC | PRN

## 2019-06-09 MED ORDER — ACETAMINOPHEN 325 MG PO TABS: 650.0000 mg | ORAL_TABLET | Freq: Four times a day (QID) | ORAL | Status: DC | PRN

## 2019-06-09 NOTE — H&P (Signed)
History and Physical    Patty [name] [id number] DOB: 1930/05/18 DOA: 06/08/2019  PCP: [name], MD   Patient coming from: Home.  I have personally briefly reviewed patient's old medical records in [place]  Chief Complaint: Chest pressure and dyspnea.  HPI: Patty [name] is a 86 y.o. female with medical history significant of chronic atrial fibrillation on warfarin, tachycardia-bradycardia syndrome, pacemaker placement, chronic combined systolic and diastolic heart failure, history of MI, CAD with history of multiple stents, hypertension, renal artery stenosis, diverticulosis, diverticulitis, granulosa cell carcinoma, hyperlipidemia, LBBB, obesity, OSA on CPAP, ovarian cancer who is coming to the emergency department due to nonradiated, central chest pressure for the past 2 days associated with a week history of progressively worse fatigue, dyspnea and lower extremity edema.  These symptoms get worse with exertion.  She denies having any dietary indiscretions with sodium or oral fluids and has been taking her medication regimen as instructed.  She denies fever, rhinorrhea, sore throat, I have passed on cough.  No abdominal pain, nausea, emesis,, recent diarrhea or constipation, melena or hematochezia.  No dysuria, frequency or materia.  No polyuria, polydipsia, polyphagia or blurred vision.  ED Course: Initial vital signs were temperature 97.7 F, pulse 108, respirations 16, blood pressure 140/74 and O2 sat 95% on room air.  The patient received supplemental oxygen, 25 mg of oral metoprolol and 60 mg of furosemide IVP.  Her work-up showed Normal CBC.  PT was 45.0 and INR 5.0. EKG showed wide QRS rhythm, right superior axis deviation.  LBBB, nonspecific intraventricular conduction block. Troponin is 237 and then 244 ng/L.  BNP was 1092.6 pg/mL.  BMP showed normal electrolytes except for calcium of 8.7 mg/dL.  We will need albumin level to see if it corrects to normal.  Glucose  122, BUN 21 and creatinine 1.13 mg/dL.  SARS coronavirus 2 was negative.  Chest radiograph showed cardiomegaly with small bilateral pleural effusions.  Bilateral densities may represent atelectasis or infiltrate.  Please see images and full evaluation for for further detail.  Review of Systems: As per HPI otherwise all other systems reviewed and are negative.  Past Medical History:  Diagnosis Date  . Bleeding behind the abdominal cavity 10/2017  . Chronic anticoagulation - coumadin, CHADS2VASC=6 05/17/2015  . CKD (chronic kidney disease), stage III   . Combined systolic and diastolic heart failure ([place])   . Coronary artery disease    a. s/p multiple stents - stenting x 2 to the LAD and x 1 to the LCx in 2000, rotational arthrectomy to proximal LCx and DES to LAD in 2014 and DES to LCx in 2014, along with DES to LAD for re-in-stent stenosis in 2015, and DES to ostial LAD in 2016. b. 07/2016 - orbital atherectomy & DES to mid LCx.  . Diverticulitis   . Granulosa cell carcinoma ([place])    abd; last episode was in 2009  . Hyperlipidemia   . Hypertension   . LBBB (left bundle branch block)   . Myocardial infarction ([place]) 2002  . Obesity   . OSA on CPAP   . Ovarian ca ([place]) 2019  . Pacemaker failure    a. Prior leadless PPM with premature battery failure, being managed conservatively without replacement.  . Peripheral vascular disease ([place])   . Permanent atrial fibrillation ([place]) 2013  . Renal artery stenosis ([place])   . Tachycardia-bradycardia syndrome ([place])    a. s/p leadless pacemaker ([name]) implanted by Dr [name]  . Varicose veins  Past Surgical History:  Procedure Laterality Date  . ABDOMINAL HYSTERECTOMY    . CARDIAC CATHETERIZATION  09/03/2007   EF 70%; Failed attempt at PCI to OM  . CARDIAC CATHETERIZATION  11/01/2003   EF 70%  . CARDIAC CATHETERIZATION N/A 12/14/2015   Procedure: Left Heart Cath and Coronary Angiography;  Surgeon: Burnell Blanks, MD;  Location: [place] CV LAB;  Service: Cardiovascular;  Laterality: N/A;  . CARDIOVERSION  12/31/2011   Procedure: CARDIOVERSION;  Surgeon: [name], MD;  Location: [facility] ENDOSCOPY;  Service: Cardiovascular;  Laterality: N/A;  . CARDIOVERSION N/A 12/31/2011   Procedure: CARDIOVERSION;  Surgeon: [name], MD;  Location: [facility] CATH LAB;  Service: Cardiovascular;  Laterality: N/A;  . CARDIOVERSION N/A 11/15/2018   Procedure: CARDIOVERSION;  Surgeon: Dorothy Spark, MD;  Location: [place];  Service: Cardiovascular;  Laterality: N/A;  . CATARACT EXTRACTION, BILATERAL  2015  . CHOLECYSTECTOMY  1980's  . COLON SURGERY  2004   colectomy for diverticulosis  . CORONARY ANGIOPLASTY WITH STENT PLACEMENT  2000    and 08/11/2012; 11/12/2012: 3 + 2 LAD & CFX; 2nd CFX stent 11/12/2012  . CORONARY ATHERECTOMY N/A 07/15/2016   Procedure: Coronary Atherectomy;  Surgeon: Martinique, Peter M, MD;  Location: [place] CV LAB;  Service: Cardiovascular;  Laterality: N/A;  . CORONARY BALLOON ANGIOPLASTY N/A 07/14/2016   Procedure: Coronary Balloon Angioplasty;  Surgeon: Martinique, Peter M, MD;  Location: [place] CV LAB;  Service: Cardiovascular;  Laterality: N/A;  . CORONARY STENT INTERVENTION N/A 07/15/2016   Procedure: Coronary Stent Intervention;  Surgeon: Martinique, Peter M, MD;  Location: [place] CV LAB;  Service: Cardiovascular;  Laterality: N/A;  . FRACTIONAL FLOW RESERVE WIRE  10/07/2013   Procedure: [place];  Surgeon: [name], MD;  Location: [facility] CATH LAB;  Service: Cardiovascular;;  . HERNIA REPAIR  2005   "laparoscopic"  . IR IMAGING GUIDED PORT INSERTION  10/28/2017  . LEFT HEART CATH AND CORONARY ANGIOGRAPHY N/A 07/14/2016   Procedure: Left Heart Cath and Coronary Angiography;  Surgeon: Martinique, Peter M, MD;  Location: [place] CV LAB;  Service: Cardiovascular;  Laterality: N/A;  . LEFT HEART CATH AND CORONARY ANGIOGRAPHY N/A 11/10/2018   Procedure: LEFT HEART CATH  AND CORONARY ANGIOGRAPHY;  Surgeon: Wellington Hampshire, MD;  Location: [place] CV LAB;  Service: Cardiovascular;  Laterality: N/A;  . LEFT HEART CATHETERIZATION WITH CORONARY ANGIOGRAM N/A 11/12/2012   Procedure: LEFT HEART CATHETERIZATION WITH CORONARY ANGIOGRAM;  Surgeon: [name], MD;  Location: [facility] CATH LAB;  Service: Cardiovascular;  Laterality: N/A;  . LEFT HEART CATHETERIZATION WITH CORONARY ANGIOGRAM N/A 10/07/2013   Procedure: LEFT HEART CATHETERIZATION WITH CORONARY ANGIOGRAM;  Surgeon: [name], MD;  Location: [facility] CATH LAB;  Service: Cardiovascular;  Laterality: N/A;  . LEFT HEART CATHETERIZATION WITH CORONARY ANGIOGRAM N/A 12/14/2013   Procedure: LEFT HEART CATHETERIZATION WITH CORONARY ANGIOGRAM;  Surgeon: Sinclair Grooms, MD;  Location: [facility] CATH LAB;  Service: Cardiovascular;  Laterality: N/A;  . LEFT HEART CATHETERIZATION WITH CORONARY ANGIOGRAM N/A 05/16/2014   Procedure: LEFT HEART CATHETERIZATION WITH CORONARY ANGIOGRAM;  Surgeon: Sherren Mocha, MD;  Location: [facility] CATH LAB;  Service: Cardiovascular;  Laterality: N/A;  . PERCUTANEOUS CORONARY INTERVENTION-BALLOON ONLY  08/04/2012   Procedure: PERCUTANEOUS CORONARY INTERVENTION-BALLOON ONLY;  Surgeon: [name], MD;  Location: [facility] CATH LAB;  Service: Cardiovascular;;  . PERCUTANEOUS CORONARY ROTOBLATOR INTERVENTION (PCI-R) N/A 08/11/2012   Procedure: PERCUTANEOUS CORONARY ROTOBLATOR INTERVENTION (PCI-R);  Surgeon: [name], MD;  Location: [facility] CATH LAB;  Service: Cardiovascular;  Laterality: N/A;  . PERCUTANEOUS CORONARY STENT INTERVENTION (PCI-S)  10/07/2013   Procedure: PERCUTANEOUS CORONARY STENT INTERVENTION (PCI-S);  Surgeon: [name], MD;  Location: [facility] CATH LAB;  Service: Cardiovascular;;  . PERMANENT PACEMAKER INSERTION N/A 03/16/2012   [name] (SJM) leadless pacemaker (LEADLESS II STUDY PATEINT)  . SALIVARY GLAND SURGERY  2000's   "had a little lump removed; granulosa related; it  was benign" (08/11/2012)  . TEE WITHOUT CARDIOVERSION  12/31/2011   Procedure: TRANSESOPHAGEAL ECHOCARDIOGRAM (TEE);  Surgeon: [name], MD;  Location: [facility] ENDOSCOPY;  Service: Cardiovascular;  Laterality: N/A;  . UMBILICAL GRANULOMA EXCISION  2000   2003; 2004; 2007: "all in my abdomen including small intestines, outside my ?uterus/etc" (08/11/2012)  . VARICOSE VEIN SURGERY Bilateral 1977    Social History  reports that she quit smoking about 45 years ago. Her smoking use included cigarettes. She has a 32.00 pack-year smoking history. She has never used smokeless tobacco. She reports current alcohol use of about 7.0 standard drinks of alcohol per week. She reports that she does not use drugs.  Allergies  Allergen Reactions  . Bee Venom Anaphylaxis  . Other Nausea And Vomiting and Other (See Comments)    Pain medications cause severe vomiting. Tolerated slow IV morphine drip  . Oxycodone Hcl Nausea And Vomiting  . Amiodarone Nausea Only  . Chlorhexidine   . Prednisone Palpitations and Other (See Comments)    "Rapid Heart Beat"    Family History  Problem Relation Age of Onset  . Stroke Mother   . Heart attack Father   . Diabetes Father   . Hypertension Father   . Heart attack Brother   . Diabetes Brother   . Hypertension Brother   . Kidney failure Brother    Prior to Admission medications   Medication Sig Start Date End Date Taking? Authorizing Provider  acetaminophen (TYLENOL) 500 MG tablet Take 500 mg by mouth every 6 (six) hours as needed for mild pain or headache.     [provider]  atorvastatin (LIPITOR) 80 MG tablet TAKE 1 TABLET BY MOUTH EVERY DAY IN THE MORNING Patient taking differently: Take 80 mg by mouth daily.  04/04/19   [name], MD  beta carotene w/minerals (OCUVITE) tablet Take 1 tablet by mouth 2 (two) times daily.     [provider]  clopidogrel (PLAVIX) 75 MG tablet TAKE 1 TABLET BY MOUTH EVERY DAY Patient taking  differently: Take 75 mg by mouth daily.  03/15/19   [name], MD  Coenzyme Q-10 100 MG capsule Take 200 mg by mouth at bedtime.     [provider]  dofetilide (TIKOSYN) 125 MCG capsule Take 1 capsule (125 mcg total) by mouth 2 (two) times daily. 12/16/18   Sherran Needs, NP  furosemide (LASIX) 40 MG tablet Take 2 tablets by mouth every morning and 2 additional tabs as needed for wt gain/abdominal distension Patient taking differently: Take 40 mg by mouth See admin instructions. Take 2 tablets by mouth every morning and 2 additional tabs as needed for wt gain/abdominal distension  03/30/19   [name], MD  gabapentin (NEURONTIN) 300 MG capsule TAKE 1 CAPSULE BY MOUTH TWICE A DAY Patient taking differently: Take 300 mg by mouth 2 (two) times daily.  04/25/19   Heath Lark, MD  isosorbide mononitrate (IMDUR) 60 MG 24 hr tablet Take 1.5 tablets (90 mg total)  by mouth daily. 05/31/19 09/28/19  Nita Sells, MD  lidocaine-prilocaine (EMLA) cream Apply to [facility] site 1-2 hours prior to access Patient not taking: Reported on 05/28/2019 09/30/18   Joylene John D, NP  magnesium oxide (MAG-OX) 400 (241.3 Mg) MG tablet Take 2 tablets (800 mg total) by mouth daily. 05/31/19   Nita Sells, MD  magnesium oxide (MAG-OX) 400 MG tablet Take 1 tablet by mouth 2 (two) times daily. 05/31/19   [provider]  metoprolol (TOPROL-XL) 200 MG 24 hr tablet Take 1 tablet (200 mg total) by mouth daily. Take with or immediately following a meal. 12/10/18   Sherran Needs, NP  nitroGLYCERIN (NITROSTAT) 0.4 MG SL tablet PLACE 1 TABLET (0.4 MG TOTAL) UNDER THE TONGUE EVERY 5 (FIVE) MINUTES AS NEEDED FOR CHEST PAIN Patient taking differently: Place 0.4 mg under the tongue every 5 (five) minutes as needed for chest pain.  05/26/19   Sherran Needs, NP  potassium chloride SA (KLOR-CON) 20 MEQ tablet Take 1 tablet (20 mEq total) by mouth 2 (two) times daily. 01/21/19   Allred, Jeneen Rinks,  MD  warfarin (COUMADIN) 2 MG tablet TAKE AS DIRECTED BY COUMADIN CLINIC Patient taking differently: Take 1-2 mg by mouth See admin instructions. Take 1 tablet on Tuesday, Thursday, Saturday, and Sunday and take 1/2 on Monday, Wednesday, and Friday. 04/22/19   [name], MD    Physical Exam: Vitals:   06/09/19 0400 06/09/19 0445 06/09/19 0545 06/09/19 0601  BP: (!) 149/83 133/62 (!) 111/55 (!) 151/72  Pulse: (!) 105 (!) 104 72 93  Resp: 16 (!) 26 18   Temp:      TempSrc:      SpO2: 95% 97% 97%   Weight:      Height:        Constitutional: NAD, calm, comfortable Eyes: PERRL, lids and conjunctivae normal ENMT: Mucous membranes are moist. Posterior pharynx clear of any exudate or lesions. Neck: normal, supple, no masses, no thyromegaly.  No JVD. Respiratory: Bibasilar crackles, no wheezing or rhonchi. Normal respiratory effort. No accessory muscle use.  Cardiovascular: Irregularly irregular S1-S2, no murmurs / rubs / gallops.  1+ bilateral lower extremity pitting edema. 2+ pedal pulses. No carotid bruits.  Abdomen: Nondistended.  Obese.  BS positive.  Soft, no tenderness, no masses palpated. No hepatosplenomegaly. Musculoskeletal: no clubbing / cyanosis. Good ROM, no contractures. Normal muscle tone.  Skin: Some areas of ecchymosis on extremities. Neurologic: CN 2-12 grossly intact. Sensation intact, DTR normal. Strength 5/5 in all 4.  Psychiatric: Normal judgment and insight. Alert and oriented x 3. Normal mood.   Labs on Admission: I have personally reviewed following labs and imaging studies  CBC: Recent Labs  Lab 06/08/19 2358  WBC 5.9  HGB 12.8  HCT 41.4  MCV 99.5  PLT [id number]    Basic Metabolic Panel: Recent Labs  Lab 06/08/19 2358  NA 142  K 3.9  CL 100  CO2 30  GLUCOSE 122*  BUN 21  CREATININE 1.13*  CALCIUM 8.7*    GFR: Estimated Creatinine Clearance: 32.8 mL/min (A) (by C-G formula based on SCr of 1.13 mg/dL (H)).  Liver Function Tests: No  results for input(s): AST, ALT, ALKPHOS, BILITOT, PROT, ALBUMIN in the last 168 hours.  Urine analysis:    Component Value Date/Time   COLORURINE YELLOW 10/12/2017 0916   APPEARANCEUR CLEAR 10/12/2017 0916   LABSPEC >1.046 (H) 10/12/2017 0916   PHURINE 6.0 10/12/2017 0916   GLUCOSEU NEGATIVE 10/12/2017 [id number]  HGBUR NEGATIVE 10/12/2017 0916   BILIRUBINUR NEGATIVE 10/12/2017 0916   KETONESUR NEGATIVE 10/12/2017 0916   PROTEINUR NEGATIVE 10/12/2017 0916   UROBILINOGEN 0.2 12/11/2013 1119   NITRITE NEGATIVE 10/12/2017 0916   LEUKOCYTESUR TRACE (A) 10/12/2017 0916   Radiological Exams on Admission: DG Chest 2 View  Result Date: 06/09/2019 CLINICAL DATA:  86 year old female with chest pain EXAM: CHEST - 2 VIEW COMPARISON:  Chest radiograph dated 05/30/2019. FINDINGS: There is cardiomegaly. Small bilateral pleural effusions. Bibasilar densities may represent atelectasis or infiltrate. No pneumothorax. Left pectoral pacemaker device. Right chest wall Port-A-Cath in similar position. No acute osseous pathology. IMPRESSION: Small bilateral pleural effusions and bibasilar atelectasis/infiltrate. Electronically Signed   By: Anner Crete M.D.   On: 06/09/2019 00:37   EKG: Independently reviewed.  Vent. rate 108 BPM PR interval * ms QRS duration 162 ms QT/QTc 428/573 ms P-R-T axes * 258 80 Wide QRS rhythm Right superior axis deviation Non-specific intra-ventricular conduction block Minimal voltage criteria for LVH, may be normal variant ( Cornell product ) Abnormal ECG Left bundle branch block  Assessment/Plan Principal Problem:   ACS (acute coronary syndrome) ([name])  CAD (coronary artery disease) Observation/progressive unit. Continue supplemental oxygen. On warfarin with supratherapeutic INR. Hold beta-blocker until failure symptoms better. Continue on atorvastatin 80 mg p.o. daily. Cardiology consult appreciated. Electrophysiology will be following.  Active Problems:   Acute  on chronic combined systolic and diastolic CHF Continue supplemental oxygen. Fluid and sodium restriction. Monitor daily weights, intake and output. Continue furosemide 40 mg IVP daily. Monitor renal function electrolytes.    Atrial fibrillation, chronic ([name]) CHA?DS?-VASc Score of at least 6. On warfarin. Continue beta-blocker once heart failure symptoms better.    Essential hypertension On IV furosemide at this time. Resume metoprolol once CHF symptoms improved. Monitor BP, heart rate, renal function electrolytes.    Hyperlipidemia On atorvastatin.    DVT prophylaxis: On warfarin. Code Status:    DNR. Family Communication: Disposition Plan:   Patient is from:  Home.  Anticipated DC to:  Home.  Anticipated DC date:  06/10/2019.  Anticipated DC barriers: Clinical improvement.  Consults called:  Cardiology Delfina Redwood, MD). Admission status:  Observation/progressive unit.    Severity of Illness: Medium to high.  Reubin Milan MD [facility]  How to contact the [facility] Attending or Consulting provider [place] or covering provider during after hours [place], for this patient?   1. Check the care team in [facility] and look for a) attending/consulting [facility] provider listed and b) the [facility] team listed 2. Log into [email or web address] and use [place]'s universal password to access. If you do not have the password, please contact the hospital operator. 3. Locate the [facility] provider you are looking for under [facility] and page to a number that you can be directly reached. 4. If you still have difficulty reaching the provider, please page the [facility] (Director on Call) for the Hospitalists listed on amion for assistance.  06/09/2019, 6:04 AM   This document was prepared using Dragon voice recognition software and may contain some unintended transcription errors.

## 2019-06-09 NOTE — Progress Notes (Signed)
Patient seen and examined personally, I reviewed the chart, history and physical and admission note, done by admitting physician this morning and agree with the same with following addendum.  Please refer to the morning admission note for more detailed plan of care.  Briefly,  Per HPI: 86 y.o. female with medical history significant of chronic atrial fibrillation on warfarin, tachycardia-bradycardia syndrome, pacemaker placement, chronic combined systolic and diastolic heart failure, history of MI, CAD with history of multiple stents, hypertension, renal artery stenosis, diverticulosis, diverticulitis, granulosa cell carcinoma, hyperlipidemia, LBBB, obesity, OSA on CPAP, ovarian cancer who is coming to the emergency department due to nonradiated, central chest pressure for the past 2 days associated with a week history of progressively worse fatigue, dyspnea and lower extremity edema.  In ED: Initial vital signs were temperature 97.7 F, pulse 108, respirations 16, blood pressure 140/74 and O2 sat 95% on room air.  The patient received supplemental oxygen, 25 mg of oral metoprolol and 60 mg of furosemide IVP. LABS: CBC.  PT was 45.0 and INR 5.0. EKG showed wide QRS rhythm, right superior axis deviation.  LBBB, nonspecific intraventricular conduction block. Troponin is 237 and then 244 ng/L.  BNP was 1092.6 pg/mL.  BMP showed normal electrolytes except for calcium of 8.7 mg/dL.  We will need albumin level to see if it corrects to normal.  Glucose 122, BUN 21 and creatinine 1.13 mg/dL.  SARS coronavirus 2 was negative.  Chest radiograph showed cardiomegaly with small bilateral pleural effusions.  Bilateral densities may represent atelectasis or infiltrate.  subjective  Pt c/o still having "lot of fatigue and weakness". No chest pain. Last night had ' lots of trouble breathing and chest pain" My atrial fib is given me problem. breathing better.HR in 80s Does not have oxygen at home, currently on 2  lNC. Lives at home by herself, has daughter in [place] full time. No help at home. Still drives.  On Exam,Has mild leg edema.  No acute distress notes. She feels overall improving.  Issued being addressed:  Chest pain/CAD-severe/elevated troponin: With underlying three-vessel CAD.  Suspected elevated troponin due to supply demand ischemia in the setting of worsening cardiomyopathy and A. fib with RVR.  No plan for ischemic evaluation at this time, discontinue diltiazem given degree of cardiomyopathy overall prognosis is poor.  A. fib/a flutter W/ RVR: Noted plan from cardiology with EP evaluation and underwent interrogation degree of under sensing her AF.  DCCV, to continue Tikosyn.  Follow-up INR BMP, switching up Xarelto once INR less than 3, hold Coumadin for now  Acute on Chronic combined diastolic and systolic CHF exacerbation: She has some sign of volume congestion.  Has elevated BNP.  LVEF 40-45% in echo.  Continue IV Lasix as per cardiology.  Essential hypertension: Blood pressure fairly stable.cont metoprolol.  Hyperlipidemia: cont Lipitor   Goals of care: DNR, patient is followed by hospice/palliative care on outpatient basis.  Status is: admitted as Observation  Patient remains hospitalized and will need continued monitoring in telemetry, diuresis and follow-up by cardiology and plan for DCCV in the morning  Dispo: The patient is from: Home              Anticipated d/c is to: Home              Anticipated d/c date is: 1-2 days              Patient currently is not medically stable to d/c.

## 2019-06-09 NOTE — Consult Note (Signed)
Cardiology Consultation:   Patient ID: Patty [name] MRN: [id number]; DOB: 10-May-1930  Admit date: 06/08/2019 Date of Consult: 06/09/2019  Primary Care Provider: [name], MD Primary Cardiologist: Larae Grooms, MD  Primary Electrophysiologist:  Thompson Grayer, MD    Patient Profile:   Patty [name] is a 86 y.o. female with a hx of CAD s/p multiple stents, chronic combined CHF, longstanding but paroxysmal atrial fib/flutter on Tikosyn and Coumadin with tachy-brady syndrome s/p leadless PPM (with premature battery failure no longer active - being followed conservatively for now), probable CKD III per labs, granulosa cell carcinoma, HTN, HLD, LBBB, varicose veins, renal artery stenosis, PVD, diverticulitis, OSA  who is being seen today for the evaluation of worsening DOE and abnormal troponin at the request of [facility].  History of Present Illness:   Patty [name] has a complex cardiac hx. She has h/o significant CAD with stenting x 2 to the LAD and x 1 to the LCx in 2000, rotational arthrectomy to proximal LCx and DES to LAD in 2014 and DES to LCx in 2014, along with DES to LAD for -in-stent stenosis in 2015, and DES to ostial LAD in 2016. Most recent [place] in Oct 2020 showed stable 3V CAD, no lesions amenable to revascularization. Medical tx was recommended. Pt was admitted due to abnormal troponin on 05-28-19; this was felt to be due to arrhythmia-related demand ischemia given severe underlying CAD. No LHC was done during that admission.  In regard to her EP history, she has h/o permanent afib that became very difficult to rate-control; decision was made to start tikosyn. DCCV restored SR at the time, but pt has had multiple significant arrhythmia recurrences, including at the end of last month when she was admitted (05-28-19). She was last seen in AF clinic 05-26-19 at which time device interrogation showed 7% burden of AF/flutter.  She has granulosa cell CA, and has recently discussed with  her oncologist that chemotherapy will be stopped and a palliative approach will be pursued.  That being said she is in favor of cardiac procedures as needed and has had experience with coronary angiography before having received multiple interventions.   During her most recent hospitalization 05-28-19, as aforementioned she had elevated troponin, felt most likely to be due to demand ischemia due to atrial flutter. TTE was repeated which showed stable LVEF of 40-45%. No further ischemia w/u was undertaken during that admission.  Tonight she decided to come to the ED for evaluation due to 1 week of gradually worsening dyspnea on exertion and generalized fatigue with associated substernal chest pressure that has worsened over the past 1 to 2 days.  Chest pressure is substernal nonradiating.  It is exertional and relieved with rest. She states she has really been SOB since prior to last week; she was hospitalized, but never really felt better even when she was discharged. She was trying to hold out for her next cardiology appt 06-14-19, but decided to come in tonight b/c she just felt terrible. She has tried taking extra PO lasix, but does not think it has been effective.  EKG tonight shows recurrent atrial flutter, w/ elevated HR of 108. HS Troponin is elevated at 237. As discussed above, pt has had elevated trop in the past due to severe underlying CAD and superimposed demand related to atrial tachyarrhythmias. Last LHC in Oct 2020 essentially showed 3V CAD, with multiple lesions not amenable to intervention, and medical tx was recommended.    Past Medical History:  Diagnosis  Date  . Bleeding behind the abdominal cavity 10/2017  . Chronic anticoagulation - coumadin, CHADS2VASC=6 05/17/2015  . CKD (chronic kidney disease), stage III   . Combined systolic and diastolic heart failure ([place])   . Coronary artery disease    a. s/p multiple stents - stenting x 2 to the LAD and x 1 to the LCx in 2000, rotational  arthrectomy to proximal LCx and DES to LAD in 2014 and DES to LCx in 2014, along with DES to LAD for re-in-stent stenosis in 2015, and DES to ostial LAD in 2016. b. 07/2016 - orbital atherectomy & DES to mid LCx.  . Diverticulitis   . Granulosa cell carcinoma ([place])    abd; last episode was in 2009  . Hyperlipidemia   . Hypertension   . LBBB (left bundle branch block)   . Myocardial infarction ([place]) 2002  . Obesity   . OSA on CPAP   . Ovarian ca ([place]) 2019  . Pacemaker failure    a. Prior leadless PPM with premature battery failure, being managed conservatively without replacement.  . Peripheral vascular disease ([place])   . Permanent atrial fibrillation ([place]) 2013  . Renal artery stenosis ([place])   . Tachycardia-bradycardia syndrome ([place])    a. s/p leadless pacemaker ([name]) implanted by Dr [name]  . Varicose veins     Past Surgical History:  Procedure Laterality Date  . ABDOMINAL HYSTERECTOMY    . CARDIAC CATHETERIZATION  09/03/2007   EF 70%; Failed attempt at PCI to OM  . CARDIAC CATHETERIZATION  11/01/2003   EF 70%  . CARDIAC CATHETERIZATION N/A 12/14/2015   Procedure: Left Heart Cath and Coronary Angiography;  Surgeon: Burnell Blanks, MD;  Location: [place] CV LAB;  Service: Cardiovascular;  Laterality: N/A;  . CARDIOVERSION  12/31/2011   Procedure: CARDIOVERSION;  Surgeon: [name], MD;  Location: [facility] ENDOSCOPY;  Service: Cardiovascular;  Laterality: N/A;  . CARDIOVERSION N/A 12/31/2011   Procedure: CARDIOVERSION;  Surgeon: [name], MD;  Location: [facility] CATH LAB;  Service: Cardiovascular;  Laterality: N/A;  . CARDIOVERSION N/A 11/15/2018   Procedure: CARDIOVERSION;  Surgeon: Dorothy Spark, MD;  Location: [place];  Service: Cardiovascular;  Laterality: N/A;  . CATARACT EXTRACTION, BILATERAL  2015  . CHOLECYSTECTOMY  1980's  . COLON SURGERY  2004   colectomy for diverticulosis  . CORONARY ANGIOPLASTY WITH STENT PLACEMENT  2000    and  08/11/2012; 11/12/2012: 3 + 2 LAD & CFX; 2nd CFX stent 11/12/2012  . CORONARY ATHERECTOMY N/A 07/15/2016   Procedure: Coronary Atherectomy;  Surgeon: Martinique, Peter M, MD;  Location: [place] CV LAB;  Service: Cardiovascular;  Laterality: N/A;  . CORONARY BALLOON ANGIOPLASTY N/A 07/14/2016   Procedure: Coronary Balloon Angioplasty;  Surgeon: Martinique, Peter M, MD;  Location: [place] CV LAB;  Service: Cardiovascular;  Laterality: N/A;  . CORONARY STENT INTERVENTION N/A 07/15/2016   Procedure: Coronary Stent Intervention;  Surgeon: Martinique, Peter M, MD;  Location: [place] CV LAB;  Service: Cardiovascular;  Laterality: N/A;  . FRACTIONAL FLOW RESERVE WIRE  10/07/2013   Procedure: [place];  Surgeon: [name], MD;  Location: [facility] CATH LAB;  Service: Cardiovascular;;  . HERNIA REPAIR  2005   "laparoscopic"  . IR IMAGING GUIDED PORT INSERTION  10/28/2017  . LEFT HEART CATH AND CORONARY ANGIOGRAPHY N/A 07/14/2016   Procedure: Left Heart Cath and Coronary Angiography;  Surgeon: Martinique, Peter M, MD;  Location: [place] CV LAB;  Service: Cardiovascular;  Laterality: N/A;  . LEFT HEART CATH AND CORONARY ANGIOGRAPHY N/A 11/10/2018   Procedure: LEFT HEART CATH AND CORONARY ANGIOGRAPHY;  Surgeon: Wellington Hampshire, MD;  Location: [place] CV LAB;  Service: Cardiovascular;  Laterality: N/A;  . LEFT HEART CATHETERIZATION WITH CORONARY ANGIOGRAM N/A 11/12/2012   Procedure: LEFT HEART CATHETERIZATION WITH CORONARY ANGIOGRAM;  Surgeon: [name], MD;  Location: [facility] CATH LAB;  Service: Cardiovascular;  Laterality: N/A;  . LEFT HEART CATHETERIZATION WITH CORONARY ANGIOGRAM N/A 10/07/2013   Procedure: LEFT HEART CATHETERIZATION WITH CORONARY ANGIOGRAM;  Surgeon: [name], MD;  Location: [facility] CATH LAB;  Service: Cardiovascular;  Laterality: N/A;  . LEFT HEART CATHETERIZATION WITH CORONARY ANGIOGRAM N/A 12/14/2013   Procedure: LEFT HEART CATHETERIZATION WITH CORONARY  ANGIOGRAM;  Surgeon: Sinclair Grooms, MD;  Location: [facility] CATH LAB;  Service: Cardiovascular;  Laterality: N/A;  . LEFT HEART CATHETERIZATION WITH CORONARY ANGIOGRAM N/A 05/16/2014   Procedure: LEFT HEART CATHETERIZATION WITH CORONARY ANGIOGRAM;  Surgeon: Sherren Mocha, MD;  Location: [facility] CATH LAB;  Service: Cardiovascular;  Laterality: N/A;  . PERCUTANEOUS CORONARY INTERVENTION-BALLOON ONLY  08/04/2012   Procedure: PERCUTANEOUS CORONARY INTERVENTION-BALLOON ONLY;  Surgeon: [name], MD;  Location: [facility] CATH LAB;  Service: Cardiovascular;;  . PERCUTANEOUS CORONARY ROTOBLATOR INTERVENTION (PCI-R) N/A 08/11/2012   Procedure: PERCUTANEOUS CORONARY ROTOBLATOR INTERVENTION (PCI-R);  Surgeon: [name], MD;  Location: [facility] CATH LAB;  Service: Cardiovascular;  Laterality: N/A;  . PERCUTANEOUS CORONARY STENT INTERVENTION (PCI-S)  10/07/2013   Procedure: PERCUTANEOUS CORONARY STENT INTERVENTION (PCI-S);  Surgeon: [name], MD;  Location: [facility] CATH LAB;  Service: Cardiovascular;;  . PERMANENT PACEMAKER INSERTION N/A 03/16/2012   [name] (SJM) leadless pacemaker (LEADLESS II STUDY PATEINT)  . SALIVARY GLAND SURGERY  2000's   "had a little lump removed; granulosa related; it was benign" (08/11/2012)  . TEE WITHOUT CARDIOVERSION  12/31/2011   Procedure: TRANSESOPHAGEAL ECHOCARDIOGRAM (TEE);  Surgeon: [name], MD;  Location: [name] ENDOSCOPY;  Service: Cardiovascular;  Laterality: N/A;  . UMBILICAL GRANULOMA EXCISION  2000   2003; 2004; 2007: "all in my abdomen including small intestines, outside my ?uterus/etc" (08/11/2012)  . VARICOSE VEIN SURGERY Bilateral 1977     Home Medications:  Prior to Admission medications   Medication Sig Start Date End Date Taking? Authorizing Provider  acetaminophen (TYLENOL) 500 MG tablet Take 500 mg by mouth every 6 (six) hours as needed for mild pain or headache.     [provider]  atorvastatin (LIPITOR) 80 MG tablet TAKE 1 TABLET BY  MOUTH EVERY DAY IN THE MORNING Patient taking differently: Take 80 mg by mouth daily.  04/04/19   [name], MD  beta carotene w/minerals (OCUVITE) tablet Take 1 tablet by mouth 2 (two) times daily.     [provider]  clopidogrel (PLAVIX) 75 MG tablet TAKE 1 TABLET BY MOUTH EVERY DAY Patient taking differently: Take 75 mg by mouth daily.  03/15/19   [name], MD  Coenzyme Q-10 100 MG capsule Take 200 mg by mouth at bedtime.     [provider]  dofetilide (TIKOSYN) 125 MCG capsule Take 1 capsule (125 mcg total) by mouth 2 (two) times daily. 12/16/18   Sherran Needs, NP  furosemide (LASIX) 40 MG tablet Take 2 tablets by mouth every morning and 2 additional tabs as needed for wt gain/abdominal distension Patient taking differently: Take 40 mg by mouth See admin instructions. Take 2 tablets by mouth every morning and 2 additional  tabs as needed for wt gain/abdominal distension  03/30/19   [name], MD  gabapentin (NEURONTIN) 300 MG capsule TAKE 1 CAPSULE BY MOUTH TWICE A DAY Patient taking differently: Take 300 mg by mouth 2 (two) times daily.  04/25/19   Heath Lark, MD  isosorbide mononitrate (IMDUR) 60 MG 24 hr tablet Take 1.5 tablets (90 mg total) by mouth daily. 05/31/19 09/28/19  Nita Sells, MD  lidocaine-prilocaine (EMLA) cream Apply to [facility] site 1-2 hours prior to access Patient not taking: Reported on 05/28/2019 09/30/18   Joylene John D, NP  magnesium oxide (MAG-OX) 400 (241.3 Mg) MG tablet Take 2 tablets (800 mg total) by mouth daily. 05/31/19   Nita Sells, MD  metoprolol (TOPROL-XL) 200 MG 24 hr tablet Take 1 tablet (200 mg total) by mouth daily. Take with or immediately following a meal. 12/10/18   Sherran Needs, NP  nitroGLYCERIN (NITROSTAT) 0.4 MG SL tablet PLACE 1 TABLET (0.4 MG TOTAL) UNDER THE TONGUE EVERY 5 (FIVE) MINUTES AS NEEDED FOR CHEST PAIN Patient taking differently: Place 0.4 mg under the tongue every 5  (five) minutes as needed for chest pain.  05/26/19   Sherran Needs, NP  potassium chloride SA (KLOR-CON) 20 MEQ tablet Take 1 tablet (20 mEq total) by mouth 2 (two) times daily. 01/21/19   Allred, Jeneen Rinks, MD  warfarin (COUMADIN) 2 MG tablet TAKE AS DIRECTED BY COUMADIN CLINIC Patient taking differently: Take 1-2 mg by mouth See admin instructions. Take 1 tablet on Tuesday, Thursday, Saturday, and Sunday and take 1/2 on Monday, Wednesday, and Friday. 04/22/19   [name], MD    Inpatient Medications: Scheduled Meds:  Continuous Infusions:  PRN Meds:   Allergies:    Allergies  Allergen Reactions  . Bee Venom Anaphylaxis  . Other Nausea And Vomiting and Other (See Comments)    Pain medications cause severe vomiting. Tolerated slow IV morphine drip  . Oxycodone Hcl Nausea And Vomiting  . Amiodarone Nausea Only  . Chlorhexidine   . Prednisone Palpitations and Other (See Comments)    "Rapid Heart Beat"    Social History:   Social History   Socioeconomic History  . Marital status: Widowed    Spouse name: Not on file  . Number of children: 4  . Years of education: Not on file  . Highest education level: Not on file  Occupational History  . Occupation: Retired Nurse, learning disability estate  Tobacco Use  . Smoking status: Former Smoker    Packs/day: 1.00    Years: 32.00    Pack years: 32.00    Types: Cigarettes    Quit date: 02/03/1974    Years since quitting: 45.3  . Smokeless tobacco: Never Used  Substance and Sexual Activity  . Alcohol use: Yes    Alcohol/week: 7.0 standard drinks    Types: 7 Glasses of wine per week    Comment: one glass wine nightly with dinner  . Drug use: No  . Sexual activity: Never  Other Topics Concern  . Not on file  Social History Narrative   Lives alone.   Social Determinants of Health   Financial Resource Strain:   . Difficulty of Paying Living Expenses:   Food Insecurity:   . Worried About Charity fundraiser in the Last Year:     . Arboriculturist in the Last Year:   Transportation Needs:   . Film/video editor (Medical):   Marland Kitchen Lack of Transportation (Non-Medical):  Physical Activity:   . Days of Exercise per Week:   . Minutes of Exercise per Session:   Stress:   . Feeling of Stress :   Social Connections:   . Frequency of Communication with Friends and Family:   . Frequency of Social Gatherings with Friends and Family:   . Attends Religious Services:   . Active Member of Clubs or Organizations:   . Attends Archivist Meetings:   Marland Kitchen Marital Status:   Intimate Partner Violence:   . Fear of Current or Ex-Partner:   . Emotionally Abused:   Marland Kitchen Physically Abused:   . Sexually Abused:     Family History:    Family History  Problem Relation Age of Onset  . Stroke Mother   . Heart attack Father   . Diabetes Father   . Hypertension Father   . Heart attack Brother   . Diabetes Brother   . Hypertension Brother   . Kidney failure Brother      ROS:  Please see the history of present illness.   All other ROS reviewed and negative.     Physical Exam/Data:   Vitals:   06/09/19 0245 06/09/19 0300 06/09/19 0325 06/09/19 0400  BP: (!) 141/81 (!) 153/81  (!) 149/83  Pulse: 84 90  (!) 105  Resp: (!) 25 (!) 31  16  Temp:      TempSrc:      SpO2: 98% 99% 98% 95%  Weight:      Height:       No intake or output data in the 24 hours ending 06/09/19 0404 Last 3 Weights 06/08/2019 05/31/2019 05/30/2019  Weight (lbs) 170 lb 173 lb 14.4 oz 172 lb  Weight (kg) 77.111 kg 78.881 kg 78.019 kg     Body mass index is 33.2 kg/m.  General:  Well nourished, well developed, in no acute distress HEENT: normal Lymph: no adenopathy Neck: difficult to assess for JVD Endocrine:  No thryomegaly Vascular: No carotid bruits; DP pulses 2+ bilaterally  Cardiac:  normal S1, S2; irreg irreg, tachy; no murmur  Lungs:  Decreased BS bilaterally  Abd: soft, nontender, no hepatomegaly  Ext: tr-1+ bilateral  edema Musculoskeletal:  No deformities, BUE and BLE strength normal and equal Skin: warm and dry  Neuro:  no focal abnormalities noted Psych:  Normal affect   EKG:  The EKG was personally reviewed and demonstrates:  Atrial flutter, LBBB, HR 108   Relevant CV Studies: LHC 11-10-18  Ost LAD to Mid LAD lesion is 20% stenosed.  Mid LAD lesion is 30% stenosed.  Ost 2nd Mrg to 2nd Mrg lesion is 60% stenosed.  Ost 1st Mrg to 1st Mrg lesion is 100% stenosed.  Ost RPDA to RPDA lesion is 50% stenosed.  Ost Cx to Prox Cx lesion is 40% stenosed.  Prox Cx to Mid Cx lesion is 20% stenosed.  1st Mrg lesion is 100% stenosed.  Previously placed Dist Cx stent (unknown type) is widely patent.  2nd Mrg lesion is 90% stenosed.  There is severe left ventricular systolic dysfunction.  LV end diastolic pressure is mildly elevated.  The left ventricular ejection fraction is less than 25% by visual estimate.  Ost RCA to Mid RCA lesion is 40% stenosed.  Mid RCA to Dist RCA lesion is 20% stenosed.   1.  Underlying three-vessel coronary artery disease with widely patent stents in the LAD, left circumflex and right coronary arteries.  OM1 is occluded with collaterals.  OM 2 is  diffusely diseased and small.  There is moderate restenosis in the ostial left circumflex stent.  Mild to moderate restenosis in the RCA stents.  Ostial left circumflex stents appears to have significant restenosis at the ostium in certain views but not in other views.  I reviewed previous cardiac catheterization and the appearance does not appear significantly different. 2.  Severely reduced LV systolic function with an EF of 20 to 25% with mildly elevated left ventricular end-diastolic pressure.  Recommendations: The patient was in A. fib with RVR during the procedure which made it difficult.  She was given 2 doses of IV metoprolol.  I suspect that her elevated troponin is likely due to supply demand ischemia in the setting  of worsening cardiomyopathy and A. fib with RVR.  Recommend EP consultation to manage atrial fibrillation.  Resume heparin 8 hours after sheath pull.  I discontinued diltiazem given degree of cardiomyopathy.  Overall prognosis is poor given age and multiple comorbidities.  TTE 11-12-18 1. Left ventricular ejection fraction, by visual estimation, is 40 to  45%. The left ventricle has moderately decreased function. Normal left  ventricular size. There is mildly increased left ventricular hypertrophy.  2. Mid and apical inferior septum and apex are abnormal.  3. Global right ventricle has normal systolic function.The right  ventricular size is normal. No increase in right ventricular wall  thickness.  4. Left atrial size was moderately dilated.  5. Right atrial size was normal.  6. The mitral valve is normal in structure. No evidence of mitral valve  regurgitation. No evidence of mitral stenosis.  7. The tricuspid valve is normal in structure. Tricuspid valve  regurgitation was not visualized by color flow Doppler.  8. The aortic valve is tricuspid Aortic valve regurgitation is trivial by  color flow Doppler. Mild aortic valve sclerosis without stenosis.  9. The pulmonic valve was normal in structure. Pulmonic valve  regurgitation is not visualized by color flow Doppler.  10. The inferior vena cava is normal in size with greater than 50%  respiratory variability, suggesting right atrial pressure of 3 mmHg.   TTE 05-29-19 1. Left ventricular ejection fraction, by estimation, is 40 to 45%. The  left ventricle has moderately decreased function. The left ventricle  demonstrates regional wall motion abnormalities (see scoring  diagram/findings for description). There is severe  left ventricular hypertrophy. Left ventricular diastolic parameters are  indeterminate.  2. Right ventricular systolic function is mildly reduced. The right  ventricular size is normal.  3. Left atrial size  was moderately dilated.  4. The mitral valve is degenerative. Trivial mitral valve regurgitation.  5. The aortic valve is abnormal. Aortic valve regurgitation is mild. Mild  aortic valve sclerosis is present, with no evidence of aortic valve  stenosis.  6. The inferior vena cava is normal in size with greater than 50%  respiratory variability, suggesting right atrial pressure of 3 mmHg.   Comparison(s): A prior study was performed on 11/12/2018. No significant  change from prior study.   Laboratory Data:  High Sensitivity Troponin:   Recent Labs  Lab 05/28/19 0228 05/28/19 0453 05/28/19 0650 06/08/19 2358 06/09/19 0238  TROPONINIHS 61* 73* 98* 237* 244*     Chemistry Recent Labs  Lab 06/08/19 2358  NA 142  K 3.9  CL 100  CO2 30  GLUCOSE 122*  BUN 21  CREATININE 1.13*  CALCIUM 8.7*  GFRNONAA 44*  GFRAA 51*  ANIONGAP 12    No results for input(s): PROT, ALBUMIN, AST, ALT,  ALKPHOS, BILITOT in the last 168 hours. Hematology Recent Labs  Lab 06/08/19 2358  WBC 5.9  RBC 4.16  HGB 12.8  HCT 41.4  MCV 99.5  MCH 30.8  MCHC 30.9  RDW 14.6  PLT 309   BNP Recent Labs  Lab 06/09/19 0011  BNP 1,092.6*    DDimer No results for input(s): DDIMER in the last 168 hours.   Radiology/Studies:  DG Chest 2 View  Result Date: 06/09/2019 CLINICAL DATA:  86 year old female with chest pain EXAM: CHEST - 2 VIEW COMPARISON:  Chest radiograph dated 05/30/2019. FINDINGS: There is cardiomegaly. Small bilateral pleural effusions. Bibasilar densities may represent atelectasis or infiltrate. No pneumothorax. Left pectoral pacemaker device. Right chest wall Port-A-Cath in similar position. No acute osseous pathology. IMPRESSION: Small bilateral pleural effusions and bibasilar atelectasis/infiltrate. Electronically Signed   By: Anner Crete M.D.   On: 06/09/2019 00:37       TIMI Risk Score for Unstable Angina or Non-ST Elevation MI:   The patient's TIMI risk score is 6, which  indicates a 41% risk of all cause mortality, new or recurrent myocardial infarction or need for urgent revascularization in the next 14 days.   Assessment and Plan:   1. Elevated trop: pt has significant underlying CAD, stable from prior evaluations on last cath in Oct 2020. She has multiple significant distal or small vessel lesions that were not amenable to intervention. She has had issues w/ elevated troponin in the setting of recurrent arrhythmias as recent as 05-28-19. Her trop is slightly higher this time around, exacerbated by recurrent aflutter w/ RVR; LHC could be considered to see if there has been progression of disease in the areas that were moderate/less significant on the prior angiography. Would cont aggressive medical tx for her CAD. d/c heparin IV gtt started by ED as INR is 5.0.  2. AF/flutter: she has very refractory AF/flutter that has been followed by EP, and has been on tikosyn with some success. She has recurrent flutter tonight; last device interrogation however showed only 7% AF burden. Will have EP see her to decide whether tikosyn should be continued or alternative therapy considered. Cont tikosyn for now. She is also on toprol XL 200mg  daily at baseline; will cont that as well.  INR 5.0. would hold coumadin until INR drifts <3.0. I am going to give a one-time dose of metoprolol 25mg  IR given HR in the low 100s. Can resume regular toprol XL dose later this AM.  3. HFrEF: BNP elevated, some signs of volume congestion, likely due to recurrent AF/flutter. She has gotten a dose of 60mg  IV lasix in the ED. Would cont to diurese w/ IV lasix as needed to achieve euvolemia.  4. HTN/dyslipidemia: cont home regimen which can be adjusted PRN  5. Non-cardiac issues: mgmt as per primary medical team      For questions or updates, please contact [place] Please consult [email or web address] for contact info under     Signed, Rudean Curt, MD, [facility]  06/09/2019 4:04 AM

## 2019-06-09 NOTE — ED Provider Notes (Signed)
[facility] EMERGENCY DEPARTMENT Provider Note  CSN: [id number] Arrival date & time: 06/08/19 2330  Chief Complaint(s) Chest Pain and Shortness of Breath  HPI Timya Trimmer Boot is a 86 y.o. female with extensive past medical history listed below including atrial fibrillation, CAD status post stenting, systolic/diastolic heart failure who presents to the emergency department for 1 week of gradually worsening dyspnea on exertion and generalized fatigue with associated substernal chest pressure that has worsened over the past 1 to 2 days.  Chest pressure is substernal nonradiating.  It is exertional and relieved with rest.  Patient was admitted 2 weeks ago for unstable angina, at which time echo revealed improved EF of 40 to 45%.  Patient last had a heart catheterization in October 2020 showing three-vessel disease with stents in place with mild to moderate restenosis.  That showed a EF of 20 to 25% at that time.  Patient reports that she has been compliant with her home medications.  Does report decreased urinary output and increased edema and abdominal bloating.  She denies any nausea or vomiting.  No diarrhea.  No fevers or chills.  No other physical complaints.  HPI  Past Medical History Past Medical History:  Diagnosis Date  . Bleeding behind the abdominal cavity 10/2017  . Chronic anticoagulation - coumadin, CHADS2VASC=6 05/17/2015  . CKD (chronic kidney disease), stage III   . Combined systolic and diastolic heart failure ([place])   . Coronary artery disease    a. s/p multiple stents - stenting x 2 to the LAD and x 1 to the LCx in 2000, rotational arthrectomy to proximal LCx and DES to LAD in 2014 and DES to LCx in 2014, along with DES to LAD for re-in-stent stenosis in 2015, and DES to ostial LAD in 2016. b. 07/2016 - orbital atherectomy & DES to mid LCx.  . Diverticulitis   . Granulosa cell carcinoma ([place])    abd; last episode was in 2009  . Hyperlipidemia   . Hypertension    . LBBB (left bundle branch block)   . Myocardial infarction ([place]) 2002  . Obesity   . OSA on CPAP   . Ovarian ca ([place]) 2019  . Pacemaker failure    a. Prior leadless PPM with premature battery failure, being managed conservatively without replacement.  . Peripheral vascular disease ([place])   . Permanent atrial fibrillation ([place]) 2013  . Renal artery stenosis ([place])   . Tachycardia-bradycardia syndrome ([place])    a. s/p leadless pacemaker ([name]) implanted by Dr [name]  . Varicose veins    Patient Active Problem List   Diagnosis Date Noted  . Heart failure ([place]) 05/29/2019  . Atrial fibrillation, chronic ([place]) 05/28/2019  . Stable angina ([place]) 10/09/2018  . Coagulopathy ([place]) 10/09/2018  . Acute on chronic combined systolic and diastolic CHF (congestive heart failure) ([place]) 10/08/2018  . Peripheral neuropathy due to chemotherapy ([place]) 11/19/2017  . Pancytopenia, acquired ([place]) 11/19/2017  . Acute on chronic combined systolic (congestive) and diastolic (congestive) heart failure ([place]) 11/03/2017  . Hyperglycemia 11/03/2017  . Protein-calorie malnutrition, moderate ([place]) 10/28/2017  . Other constipation 10/20/2017  . Postherpetic neuralgia 10/20/2017  . Cancer associated pain 10/20/2017  . Physical debility 10/20/2017  . Goals of care, counseling/discussion 10/20/2017  . Ovarian cancer ([place]) 10/19/2017  . Intraperitoneal hemorrhage   . Abdominal pain 10/12/2017  . Pain of joint of left ankle and foot 08/03/2017  . Persistent atrial fibrillation   . Warfarin anticoagulation   . DOE (dyspnea on  exertion)   . Status post coronary artery stent placement   . Normocytic anemia 07/11/2016  . History of small bowel obstruction 12/22/2015  . Permanent atrial fibrillation with RVR 12/15/2015  . Demand ischemia ([place]) 12/15/2015  . NSTEMI (non-ST elevated myocardial infarction) ([place]) 05/18/2015  . Chronic anticoagulation - coumadin, CHADS2VASC=6 05/17/2015  . Unstable angina ([place])  05/17/2015  . Swelling of lower extremity 04/18/2015  . Encounter for therapeutic drug monitoring 08/25/2014  . 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[place]) 06/16/2014  . Ischemic chest pain ([place])   . OSA on CPAP 05/14/2014  . Chest pain 01/05/2014  . Atrial flutter ([place])   . Hypokalemia   . Pacemaker - St Jude Leadless PPM   . Combined systolic and diastolic heart failure, NYHA class 3 ([place])   . Tachycardia-bradycardia syndrome ([place]) 09/30/2012  . Atrial fibrillation with RVR ([place]) 12/28/2011  . Ventral hernia 10/16/2011  . Granulosa cell tumor of ovary 04/23/2011  . CAD (coronary artery disease) 10/03/2010  . Essential hypertension 10/03/2010  . Hyperlipidemia 10/03/2010  . Renal artery stenosis ([place]) 10/03/2010  . Obesity (BMI 30.0-34.9) 10/03/2010   Home Medication(s) Prior to Admission medications   Medication Sig Start Date End Date Taking? Authorizing Provider  acetaminophen (TYLENOL) 500 MG tablet Take 500 mg by mouth every 6 (six) hours as needed for mild pain or headache.     [provider]  atorvastatin (LIPITOR) 80 MG tablet TAKE 1 TABLET BY MOUTH EVERY DAY IN THE MORNING Patient taking differently: Take 80 mg by mouth daily.  04/04/19   [name], MD  beta carotene w/minerals (OCUVITE) tablet Take 1 tablet by mouth 2 (two) times daily.     [provider]  clopidogrel (PLAVIX) 75 MG tablet TAKE 1 TABLET BY MOUTH EVERY DAY Patient taking differently: Take 75 mg by mouth daily.  03/15/19   [name], MD  Coenzyme Q-10 100 MG capsule Take 200 mg by mouth at bedtime.     [provider]  dofetilide (TIKOSYN) 125 MCG capsule Take 1 capsule (125 mcg total) by mouth 2 (two) times daily. 12/16/18   Sherran Needs, NP  furosemide (LASIX) 40 MG tablet Take 2 tablets by mouth every morning and 2 additional tabs as needed for wt gain/abdominal distension Patient taking differently: Take 40 mg by mouth See admin instructions. Take 2  tablets by mouth every morning and 2 additional tabs as needed for wt gain/abdominal distension  03/30/19   [name], MD  gabapentin (NEURONTIN) 300 MG capsule TAKE 1 CAPSULE BY MOUTH TWICE A DAY Patient taking differently: Take 300 mg by mouth 2 (two) times daily.  04/25/19   Heath Lark, MD  isosorbide mononitrate (IMDUR) 60 MG 24 hr tablet Take 1.5 tablets (90 mg total) by mouth daily. 05/31/19 09/28/19  Nita Sells, MD  lidocaine-prilocaine (EMLA) cream Apply to [facility] site 1-2 hours prior to access Patient not taking: Reported on 05/28/2019 09/30/18   Joylene John D, NP  magnesium oxide (MAG-OX) 400 (241.3 Mg) MG tablet Take 2 tablets (800 mg total) by mouth daily. 05/31/19   Nita Sells, MD  metoprolol (TOPROL-XL) 200 MG 24 hr tablet Take 1 tablet (200 mg total) by mouth daily. Take with or immediately following a meal. 12/10/18   Sherran Needs, NP  nitroGLYCERIN (NITROSTAT) 0.4 MG SL tablet PLACE 1 TABLET (0.4 MG TOTAL) UNDER THE TONGUE EVERY 5 (FIVE) MINUTES AS NEEDED FOR CHEST PAIN Patient taking differently: Place 0.4 mg  under the tongue every 5 (five) minutes as needed for chest pain.  05/26/19   Sherran Needs, NP  potassium chloride SA (KLOR-CON) 20 MEQ tablet Take 1 tablet (20 mEq total) by mouth 2 (two) times daily. 01/21/19   Allred, Jeneen Rinks, MD  warfarin (COUMADIN) 2 MG tablet TAKE AS DIRECTED BY COUMADIN CLINIC Patient taking differently: Take 1-2 mg by mouth See admin instructions. Take 1 tablet on Tuesday, Thursday, Saturday, and Sunday and take 1/2 on Monday, Wednesday, and Friday. 04/22/19   [name], MD                                                                                                                                    Past Surgical History Past Surgical History:  Procedure Laterality Date  . ABDOMINAL HYSTERECTOMY    . CARDIAC CATHETERIZATION  09/03/2007   EF 70%; Failed attempt at PCI to OM  . CARDIAC CATHETERIZATION   11/01/2003   EF 70%  . CARDIAC CATHETERIZATION N/A 12/14/2015   Procedure: Left Heart Cath and Coronary Angiography;  Surgeon: Burnell Blanks, MD;  Location: [facility] CV LAB;  Service: Cardiovascular;  Laterality: N/A;  . CARDIOVERSION  12/31/2011   Procedure: CARDIOVERSION;  Surgeon: [name], MD;  Location: [facility] ENDOSCOPY;  Service: Cardiovascular;  Laterality: N/A;  . CARDIOVERSION N/A 12/31/2011   Procedure: CARDIOVERSION;  Surgeon: [name], MD;  Location: [facility] CATH LAB;  Service: Cardiovascular;  Laterality: N/A;  . CARDIOVERSION N/A 11/15/2018   Procedure: CARDIOVERSION;  Surgeon: Dorothy Spark, MD;  Location: [facility];  Service: Cardiovascular;  Laterality: N/A;  . CATARACT EXTRACTION, BILATERAL  2015  . CHOLECYSTECTOMY  1980's  . COLON SURGERY  2004   colectomy for diverticulosis  . CORONARY ANGIOPLASTY WITH STENT PLACEMENT  2000    and 08/11/2012; 11/12/2012: 3 + 2 LAD & CFX; 2nd CFX stent 11/12/2012  . CORONARY ATHERECTOMY N/A 07/15/2016   Procedure: Coronary Atherectomy;  Surgeon: Martinique, Peter M, MD;  Location: [facility] CV LAB;  Service: Cardiovascular;  Laterality: N/A;  . CORONARY BALLOON ANGIOPLASTY N/A 07/14/2016   Procedure: Coronary Balloon Angioplasty;  Surgeon: Martinique, Peter M, MD;  Location: [facility] CV LAB;  Service: Cardiovascular;  Laterality: N/A;  . CORONARY STENT INTERVENTION N/A 07/15/2016   Procedure: Coronary Stent Intervention;  Surgeon: Martinique, Peter M, MD;  Location: [facility] CV LAB;  Service: Cardiovascular;  Laterality: N/A;  . FRACTIONAL FLOW RESERVE WIRE  10/07/2013   Procedure: [place];  Surgeon: [name], MD;  Location: [facility] CATH LAB;  Service: Cardiovascular;;  . HERNIA REPAIR  2005   "laparoscopic"  . IR IMAGING GUIDED PORT INSERTION  10/28/2017  . LEFT HEART CATH AND CORONARY ANGIOGRAPHY N/A 07/14/2016   Procedure: Left Heart Cath and Coronary Angiography;  Surgeon: Martinique, Peter  M, MD;  Location: [facility] CV LAB;  Service: Cardiovascular;  Laterality: N/A;  .  LEFT HEART CATH AND CORONARY ANGIOGRAPHY N/A 11/10/2018   Procedure: LEFT HEART CATH AND CORONARY ANGIOGRAPHY;  Surgeon: Wellington Hampshire, MD;  Location: [facility] CV LAB;  Service: Cardiovascular;  Laterality: N/A;  . LEFT HEART CATHETERIZATION WITH CORONARY ANGIOGRAM N/A 11/12/2012   Procedure: LEFT HEART CATHETERIZATION WITH CORONARY ANGIOGRAM;  Surgeon: [name], MD;  Location[facility] C[facility] LAB;  Service: Cardiovascular;  Laterality: N/A;  . LEFT HEART CATHETERIZATION WITH CORONARY ANGIOGRAM N/A 10/07/2013   Procedure: LEFT HEART CATHETERIZATION WITH CORONARY ANGIOGRAM;  Surgeon: [name], MD;  Location: [facility] CATH LAB;  Service: Cardiovascular;  Laterality: N/A;  . LEFT HEART CATHETERIZATION WITH CORONARY ANGIOGRAM N/A 12/14/2013   Procedure: LEFT HEART CATHETERIZATION WITH CORONARY ANGIOGRAM;  Surgeon: Sinclair Grooms, MD;  Location: [facility] CATH LAB;  Service: Cardiovascular;  Laterality: N/A;  . LEFT HEART CATHETERIZATION WITH CORONARY ANGIOGRAM N/A 05/16/2014   Procedure: LEFT HEART CATHETERIZATION WITH CORONARY ANGIOGRAM;  Surgeon: Sherren Mocha, MD;  Location: [facility] CATH LAB;  Service: Cardiovascular;  Laterality: N/A;  . PERCUTANEOUS CORONARY INTERVENTION-BALLOON ONLY  08/04/2012   Procedure: PERCUTANEOUS CORONARY INTERVENTION-BALLOON ONLY;  Surgeon: [name], MD;  Location: [facility] CATH LAB;  Service: Cardiovascular;;  . PERCUTANEOUS CORONARY ROTOBLATOR INTERVENTION (PCI-R) N/A 08/11/2012   Procedure: PERCUTANEOUS CORONARY ROTOBLATOR INTERVENTION (PCI-R);  Surgeon: [name], MD;  Location: [facility] CATH LAB;  Service: Cardiovascular;  Laterality: N/A;  . PERCUTANEOUS CORONARY STENT INTERVENTION (PCI-S)  10/07/2013   Procedure: PERCUTANEOUS CORONARY STENT INTERVENTION (PCI-S);  Surgeon: [name], MD;  Location: [facility] CATH LAB;  Service: Cardiovascular;;  . PERMANENT PACEMAKER  INSERTION N/A 03/16/2012   [name] (SJM) leadless pacemaker (LEADLESS II STUDY PATEINT)  . SALIVARY GLAND SURGERY  2000's   "had a little lump removed; granulosa related; it was benign" (08/11/2012)  . TEE WITHOUT CARDIOVERSION  12/31/2011   Procedure: TRANSESOPHAGEAL ECHOCARDIOGRAM (TEE);  Surgeon: [name], MD;  Location: [facility] ENDOSCOPY;  Service: Cardiovascular;  Laterality: N/A;  . UMBILICAL GRANULOMA EXCISION  2000   2003; 2004; 2007: "all in my abdomen including small intestines, outside my ?uterus/etc" (08/11/2012)  . VARICOSE VEIN SURGERY Bilateral 1977   Family History Family History  Problem Relation Age of Onset  . Stroke Mother   . Heart attack Father   . Diabetes Father   . Hypertension Father   . Heart attack Brother   . Diabetes Brother   . Hypertension Brother   . Kidney failure Brother     Social History Social History   Tobacco Use  . Smoking status: Former Smoker    Packs/day: 1.00    Years: 32.00    Pack years: 32.00    Types: Cigarettes    Quit date: 02/03/1974    Years since quitting: 45.3  . Smokeless tobacco: Never Used  Substance Use Topics  . Alcohol use: Yes    Alcohol/week: 7.0 standard drinks    Types: 7 Glasses of wine per week    Comment: one glass wine nightly with dinner  . Drug use: No   Allergies Bee venom, Other, Oxycodone hcl, Amiodarone, Chlorhexidine, and Prednisone  Review of Systems Review of Systems All other systems are reviewed and are negative for acute change except as noted in the HPI  Physical Exam Vital Signs  I have reviewed the triage vital signs BP 140/74 (BP Location: Left Arm)   Pulse (!) 108   Temp 97.7 F (36.5 C) (Oral)   Resp 16   Ht 5' (1.524  m)   Wt 77.1 kg   SpO2 95%   BMI 33.20 kg/m   Physical Exam Vitals reviewed.  Constitutional:      General: She is not in acute distress.    Appearance: She is well-developed. She is not diaphoretic.  HENT:     Head: Normocephalic and atraumatic.       Nose: Nose normal.  Eyes:     General: No scleral icterus.       Right eye: No discharge.        Left eye: No discharge.     Conjunctiva/sclera: Conjunctivae normal.     Pupils: Pupils are equal, round, and reactive to light.  Cardiovascular:     Rate and Rhythm: Normal rate and regular rhythm.     Heart sounds: No murmur. No friction rub. No gallop.   Pulmonary:     Effort: Pulmonary effort is normal. Tachypnea present. No respiratory distress.     Breath sounds: Normal breath sounds. No stridor. No rales.     Comments: Not able to speak in full sentences. Abdominal:     General: There is no distension.     Palpations: Abdomen is soft.     Tenderness: There is no abdominal tenderness.  Musculoskeletal:        General: No tenderness.     Cervical back: Normal range of motion and neck supple.  Skin:    General: Skin is warm and dry.     Findings: No erythema or rash.  Neurological:     Mental Status: She is alert and oriented to person, place, and time.     ED Results and Treatments Labs (all labs ordered are listed, but only abnormal results are displayed) Labs Reviewed  BASIC METABOLIC PANEL - Abnormal; Notable for the following components:      Result Value   Glucose, Bld 122 (*)    Creatinine, Ser 1.13 (*)    Calcium 8.7 (*)    GFR calc non Af Amer 44 (*)    GFR calc Af Amer 51 (*)    All other components within normal limits  BRAIN NATRIURETIC PEPTIDE - Abnormal; Notable for the following components:   B Natriuretic Peptide 1,092.6 (*)    All other components within normal limits  TROPONIN I (HIGH SENSITIVITY) - Abnormal; Notable for the following components:   Troponin I (High Sensitivity) 237 (*)    All other components within normal limits  RESPIRATORY PANEL BY RT PCR (FLU A&B, COVID)  CBC  PROTIME-INR  TROPONIN I (HIGH SENSITIVITY)                                                                                                                          EKG  EKG Interpretation  Date/Time:  Wednesday Jun 08 2019 23:36:27 EDT Ventricular Rate:  108 PR Interval:    QRS Duration: 162 QT Interval:  428 QTC Calculation: 573 R Axis:   -102 Text Interpretation: Wide QRS rhythm  Right superior axis deviation Non-specific intra-ventricular conduction block Minimal voltage criteria for LVH, may be normal variant ( Cornell product ) Abnormal ECG Left bundle branch block Confirmed by Addison Lank [phone]) on 06/09/2019 12:54:45 AM      Radiology DG Chest 2 View  Result Date: 06/09/2019 CLINICAL DATA:  86 year old female with chest pain EXAM: CHEST - 2 VIEW COMPARISON:  Chest radiograph dated 05/30/2019. FINDINGS: There is cardiomegaly. Small bilateral pleural effusions. Bibasilar densities may represent atelectasis or infiltrate. No pneumothorax. Left pectoral pacemaker device. Right chest wall Port-A-Cath in similar position. No acute osseous pathology. IMPRESSION: Small bilateral pleural effusions and bibasilar atelectasis/infiltrate. Electronically Signed   By: Anner Crete M.D.   On: 06/09/2019 00:37    Pertinent labs & imaging results that were available during my care of the patient were reviewed by me and considered in my medical decision making (see chart for details).  Medications Ordered in ED Medications  aspirin chewable tablet 324 mg (has no administration in time range)  sodium chloride flush (NS) 0.9 % injection 3 mL (3 mLs Intravenous Given 06/09/19 0148)  furosemide (LASIX) injection 60 mg (60 mg Intravenous Given 06/09/19 0231)                                                                                                                                    Procedures .Critical Care Performed by: Fatima Blank, MD Authorized by: Fatima Blank, MD    CRITICAL CARE Performed by: Grayce Sessions Cheryel Kyte Total critical care time: 40 minutes Critical care time was exclusive of separately billable procedures and  treating other patients. Critical care was necessary to treat or prevent imminent or life-threatening deterioration. Critical care was time spent personally by me on the following activities: development of treatment plan with patient and/or surrogate as well as nursing, discussions with consultants, evaluation of patient's response to treatment, examination of patient, obtaining history from patient or surrogate, ordering and performing treatments and interventions, ordering and review of laboratory studies, ordering and review of radiographic studies, pulse oximetry and re-evaluation of patient's condition.    (including critical care time)  Medical Decision Making / ED Course I have reviewed the nursing notes for this encounter and the patient's prior records (if available in EHR or on provided paperwork).   Suezette Lafave Rooks was evaluated in Emergency Department on 06/09/2019 for the symptoms described in the history of present illness. She was evaluated in the context of the global COVID-19 pandemic, which necessitated consideration that the patient might be at risk for infection with the SARS-CoV-2 virus that causes COVID-19. Institutional protocols and algorithms that pertain to the evaluation of patients at risk for COVID-19 are in a state of rapid change based on information released by regulatory bodies including the CDC and federal and state organizations. These policies and algorithms were followed during the patient's care in the ED.  Patient presents with exertional chest  pain with dyspnea on exertion.  She has evidence of volume overload on exam and a history of congestive heart failure.  EKG showed new inferior changes that were discussed with Dr. Claiborne Billings.  He did not believe these met criteria for ST segment elevation MI.  Initial troponin was elevated, greater than most recent admission.  BNP similar to recent admission.  Chest x-ray with bilateral pleural effusions without overt  pulmonary edema.    Elevated troponin may be secondary to CHF exacerbation or possible non-ST segment elevation MI.  Patient started on heparin drip.  Given 324 of aspirin.  Lasix IV given. Cardiology consulted who will see in during admission. Medicine to admit for further work up and management.     Final Clinical Impression(s) / ED Diagnoses Final diagnoses:  DOE (dyspnea on exertion)  Precordial chest pain  Elevated troponin      This chart was dictated using voice recognition software.  Despite best efforts to proofread,  errors can occur which can change the documentation meaning.   Fatima Blank, MD 06/09/19 [phone]

## 2019-06-09 NOTE — TOC Initial Note (Signed)
Transition of Care [facility]) - Initial/Assessment Note    Patient Details  Name: Patty [name] MRN: [id number] Date of Birth: [birth year]  Transition of Care [facility]) CM/SW Contact:    Verdell Carmine, RN Phone Number: 06/09/2019, 2:52 PM  Clinical Narrative:                 Discussed care and post hospitalization at length came in with worsening shortness of breath. Understands that she does not have long to live. Her goal is to go home and make sure all papers are arranged before she passes away. She has four children, one lives in [place], , one in [place], one is [place], one in [place]. The one in [place] is alcoholic and gives her the most issue.  She states she has a [name] that has long term care nad she believes it has insurance in it for household chores, which she needs the most help with, however she states that she does have a coulple she pays to do this. I explained that this would be self pay, regular insurance does not cover.. but check with her [employer].  The DME at home she would need would be  A shower chair and a walker. Right now she is using a friends walker ( a rollator)  She does not feel she needs a hospital bed yet.  She will try to get a recliner.  She would like a palliative consult and hospice consult, and would like  go home with hospice. She would like as many services as possible during this transitional  time.   Expected Discharge Plan: Home w Hospice Care Barriers to Discharge: (P) Continued Medical Work up   Patient Goals and CMS Choice Patient states their goals for this hospitalization and ongoing recovery are:: To go home with help      Expected Discharge Plan and Services Expected Discharge Plan: [place]   Discharge Planning Services: CM Consult Post Acute Care Choice: Hospice, Home Health(Hospice or pallative care/ home health) Living arrangements for the past 2 months: Single Family Home                       ns                Prior Living Arrangements/Services Living arrangements for the past 2 months: Single Family Home Lives with:: Self Patient language and need for interpreter reviewed:: Yes Do you feel safe going back to the place where you live?: Yes      Need for Family Participation in Patient Care: Yes (Comment) Care giver support system in place?: Yes (comment) Current home services: (Had [facility] at home last year) Criminal Activity/Legal Involvement Pertinent to Current Situation/Hospitalization: No - Comment as needed  Activities of Daily Living      Permission Sought/Granted Permission sought to share information with : Case Manager Permission granted to share information with : Yes, Verbal Permission Granted     Permission granted to share info w AGENCY: Hospice Pallative        Emotional Assessment Appearance:: Appears younger than stated age Attitude/Demeanor/Rapport: Engaged Affect (typically observed): Appropriate Orientation: : Oriented to Self, Oriented to Place, Oriented to  Time, Oriented to Situation Alcohol / Substance Use: Tobacco Use Psych Involvement: No (comment)  Admission diagnosis:  ACS (acute coronary syndrome) ([place]) [I24.9] Patient Active Problem List   Diagnosis Date Noted  . ACS (acute coronary syndrome) ([place]) 06/09/2019  .  Heart failure ([place]) 05/29/2019  . Atrial fibrillation, chronic ([place]) 05/28/2019  . Stable angina ([place]) 10/09/2018  . Coagulopathy ([place]) 10/09/2018  . Acute on chronic combined systolic and diastolic CHF (congestive heart failure) ([place]) 10/08/2018  . Peripheral neuropathy due to chemotherapy ([place]) 11/19/2017  . Pancytopenia, acquired ([place]) 11/19/2017  . Acute on chronic combined systolic (congestive) and diastolic (congestive) heart failure ([place]) 11/03/2017  . Hyperglycemia 11/03/2017  . Protein-calorie malnutrition, moderate ([place]) 10/28/2017  . Other constipation 10/20/2017  . Postherpetic neuralgia 10/20/2017   . Cancer associated pain 10/20/2017  . Physical debility 10/20/2017  . Goals of care, counseling/discussion 10/20/2017  . Ovarian cancer ([place]) 10/19/2017  . Intraperitoneal hemorrhage   . Abdominal pain 10/12/2017  . Pain of joint of left ankle and foot 08/03/2017  . Persistent atrial fibrillation   . Warfarin anticoagulation   . DOE (dyspnea on exertion)   . Status post coronary artery stent placement   . Normocytic anemia 07/11/2016  . History of small bowel obstruction 12/22/2015  . Permanent atrial fibrillation with RVR 12/15/2015  . Demand ischemia ([place]) 12/15/2015  . NSTEMI (non-ST elevated myocardial infarction) ([place]) 05/18/2015  . Chronic anticoagulation - coumadin, CHADS2VASC=6 05/17/2015  . Unstable angina ([place]) 05/17/2015  . Swelling of lower extremity 04/18/2015  . Encounter for therapeutic drug monitoring 08/25/2014  . 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[place]) 06/16/2014  . Ischemic chest pain ([place])   . OSA on CPAP 05/14/2014  . Chest pain 01/05/2014  . Atrial flutter ([place])   . Hypokalemia   . Pacemaker - St Jude Leadless PPM   . Combined systolic and diastolic heart failure, NYHA class 3 ([place])   . Tachycardia-bradycardia syndrome ([place]) 09/30/2012  . Atrial fibrillation with RVR ([place]) 12/28/2011  . Ventral hernia 10/16/2011  . Granulosa cell tumor of ovary 04/23/2011  . CAD (coronary artery disease) 10/03/2010  . Essential hypertension 10/03/2010  . Hyperlipidemia 10/03/2010  . Renal artery stenosis ([place]) 10/03/2010  . Obesity (BMI 30.0-34.9) 10/03/2010   PCP:  [name], MD Pharmacy:   [id number] - [place], [place] [address] [place] [address] Phone: [phone] Fax: [phone]  [id number] [place], [place] [address] [place] [address] Phone: [phone] Fax: [phone]     Social Determinants of Health (SDOH) Interventions     Readmission Risk Interventions Readmission Risk Prevention Plan 05/31/2019  Transportation Screening Complete  Medication Review Press photographer) Complete  PCP or Specialist appointment within 3-5 days of discharge Complete  HRI or [place] Complete  SW Recovery Care/Counseling Consult Complete  Palliative Care Screening Complete  [place] Not Applicable  Some recent data might be hidden

## 2019-06-09 NOTE — Progress Notes (Signed)
ANTICOAGULATION CONSULT NOTE - Initial Consult  Pharmacy Consult for Warfarin  Indication: Afib/flutter  Allergies  Allergen Reactions  . Bee Venom Anaphylaxis  . Other Nausea And Vomiting and Other (See Comments)    Pain medications cause severe vomiting. Tolerated slow IV morphine drip  . Oxycodone Hcl Nausea And Vomiting  . Amiodarone Nausea Only  . Chlorhexidine   . Prednisone Palpitations and Other (See Comments)    "Rapid Heart Beat"    Patient Measurements: Height: 5' (152.4 cm) Weight: 77.1 kg (170 lb) IBW/kg (Calculated) : 45.5  Vital Signs: Temp: 97.7 F (36.5 C) (05/05 2347) Temp Source: Oral (05/05 2347) BP: 133/62 (05/06 0445) Pulse Rate: 104 (05/06 0445)  Labs: Recent Labs    06/08/19 2358 06/09/19 0238  HGB 12.8  --   HCT 41.4  --   PLT 309  --   LABPROT  --  45.0*  INR  --  5.0*  CREATININE 1.13*  --   TROPONINIHS 237* 244*    Estimated Creatinine Clearance: 32.8 mL/min (A) (by C-G formula based on SCr of 1.13 mg/dL (H)).   Medical History: Past Medical History:  Diagnosis Date  . Bleeding behind the abdominal cavity 10/2017  . Chronic anticoagulation - coumadin, CHADS2VASC=6 05/17/2015  . CKD (chronic kidney disease), stage III   . Combined systolic and diastolic heart failure ([place])   . Coronary artery disease    a. s/p multiple stents - stenting x 2 to the LAD and x 1 to the LCx in 2000, rotational arthrectomy to proximal LCx and DES to LAD in 2014 and DES to LCx in 2014, along with DES to LAD for re-in-stent stenosis in 2015, and DES to ostial LAD in 2016. b. 07/2016 - orbital atherectomy & DES to mid LCx.  . Diverticulitis   . Granulosa cell carcinoma ([place])    abd; last episode was in 2009  . Hyperlipidemia   . Hypertension   . LBBB (left bundle branch block)   . Myocardial infarction ([place]) 2002  . Obesity   . OSA on CPAP   . Ovarian ca ([place]) 2019  . Pacemaker failure    a. Prior leadless PPM with premature battery failure, being  managed conservatively without replacement.  . Peripheral vascular disease ([place])   . Permanent atrial fibrillation ([place]) 2013  . Renal artery stenosis ([place])   . Tachycardia-bradycardia syndrome ([place])    a. s/p leadless pacemaker ([name]) implanted by Dr [name]  . Varicose veins     Assessment: 86 y/o F with shortness of breath, on warfarin PTA for afib/flutter. INR is supra-therapeutic at 5, CBC ok.   Goal of Therapy:  INR 2-3 Monitor platelets by anticoagulation protocol: Yes   Plan:  -Hold warfarin  -Daily PT/INR, resume warfarin as INR allows  Narda Bonds, PharmD, BCPS Clinical Pharmacist Phone: [phone]

## 2019-06-09 NOTE — H&P (View-Only) (Signed)
Cardiology Consultation:   Patient ID: Patty [name] MRN: [id number]; DOB: May 27, 1930  Admit date: 06/08/2019 Date of Consult: 06/09/2019  Primary Care Provider: [name], MD Primary Cardiologist: Larae Grooms, MD  Primary Electrophysiologist:  Thompson Grayer, MD   Patient Profile:   Patty [name] is a 86 y.o. female with a hx of LBBB, CKD (III), chronic CHF (combined), CAD (severe, stenting x 2 to the LAD and x 1 to the LCx in 2000, rotational arthrectomy to proximal LCx and DES to LAD in 2014 and DES to LCx in 2014, along with DES to LAD for -in-stent stenosis in 2015, and DES to ostial LAD in 2016. Most recent [place] in Oct 2020 showed stable 3V CAD, no lesions amenable to revascularization. Medical tx was recommended), HTN, HLD, PPM, persistent AFib,chronic  granulosa  disease  who is being seen today for the evaluation of AFib rhythm management options at the request of Dr. Hassell Done.   Device information SJM dual chamber PPM,  Implanted 01/26/2017  History of Present Illness:   Patty [name] last saw Dr. [name] via telehealth visit Nov 2020, she was feeling great.  At that time there was some discussion that she may end up requiring AV node ablation and upgrade of her device to CRT, though she was feeling well and planned to monitor her AF burden.  She was at the AFib clinic 05/26/19, in AFlutter then though noted a recent device transission with ony 7% burden.  She was unaware of her rhythm.  74bpm.  Discussed that her PMD noted she had some progression of her Granulosa disease, was taken off her tamoxifen with plans to focus on quality of life, referred to palliative, and patient was comfortable with this decision. Her dofetilide was continued.  4/22/ hospitalized w/CP and rate controlled AFlutter.  Last cardiology notes device interrogation noting burden of 6% and continued on her Tikosyn. The patient mentioned that despite a palliative approach to her cancer, she would be  agreeable to cardiac interventions if needed. She has known CAD, her Trop were elevated 53-98, though no new w/u undertaken with stable echo findings.  She was diuresed and eventually discharged  She was admitted early this AM with a week of increasing DOE, and 1-2 days of chest pressure.   LABS K+ 3.9 BUN/Creat 21/1.13 Mag 2.1 BNP 1092 HS Trop 237, 224 WBC 5.9 H/H 12/41 (stable from 05/31/19) Plts 309 INR 5.0 > 5.1   She tells me since late Feb at least she has nt felt as well as she had ben last year.  She traveled over the holidays and felt great, climbing steps without difficulty.  She suspects her AFib has been acting up the last 2-3 months.  She says this week more so has felt fluttering of her AF when up and around feels her HR fast, and has been feeling more SOB, able to do less.  When doingcmore exertional activities she does get some pressure in her chest, though she perceives this as her lungs/chest muscles from breathing hard.  She recovers fairly quickly with rest. No dizzy spells, no near syncope or syncope. She denies "CP"  She has not been eating as usual for about 6 week, decreased appetite, less greens and attributes her high INR to this.  She has noted any bleeding   Past Medical History:  Diagnosis Date  . Bleeding behind the abdominal cavity 10/2017  . Chronic anticoagulation - coumadin, CHADS2VASC=6 05/17/2015  . CKD (chronic kidney disease), stage III   .  Combined systolic and diastolic heart failure ([place])   . Coronary artery disease    a. s/p multiple stents - stenting x 2 to the LAD and x 1 to the LCx in 2000, rotational arthrectomy to proximal LCx and DES to LAD in 2014 and DES to LCx in 2014, along with DES to LAD for re-in-stent stenosis in 2015, and DES to ostial LAD in 2016. b. 07/2016 - orbital atherectomy & DES to mid LCx.  . Diverticulitis   . Granulosa cell carcinoma ([place])    abd; last episode was in 2009  . Hyperlipidemia   . Hypertension   .  LBBB (left bundle branch block)   . Myocardial infarction ([place]) 2002  . Obesity   . OSA on CPAP   . Ovarian ca ([place]) 2019  . Pacemaker failure    a. Prior leadless PPM with premature battery failure, being managed conservatively without replacement.  . Peripheral vascular disease ([place])   . Permanent atrial fibrillation ([place]) 2013  . Renal artery stenosis ([place])   . Tachycardia-bradycardia syndrome ([place])    a. s/p leadless pacemaker ([name]) implanted by Dr [name]  . Varicose veins     Past Surgical History:  Procedure Laterality Date  . ABDOMINAL HYSTERECTOMY    . CARDIAC CATHETERIZATION  09/03/2007   EF 70%; Failed attempt at PCI to OM  . CARDIAC CATHETERIZATION  11/01/2003   EF 70%  . CARDIAC CATHETERIZATION N/A 12/14/2015   Procedure: Left Heart Cath and Coronary Angiography;  Surgeon: Burnell Blanks, MD;  Location: [place] CV LAB;  Service: Cardiovascular;  Laterality: N/A;  . CARDIOVERSION  12/31/2011   Procedure: CARDIOVERSION;  Surgeon: [name], MD;  Location: [facility] ENDOSCOPY;  Service: Cardiovascular;  Laterality: N/A;  . CARDIOVERSION N/A 12/31/2011   Procedure: CARDIOVERSION;  Surgeon: [name], MD;  Location: [facility] CATH LAB;  Service: Cardiovascular;  Laterality: N/A;  . CARDIOVERSION N/A 11/15/2018   Procedure: CARDIOVERSION;  Surgeon: Dorothy Spark, MD;  Location: [place];  Service: Cardiovascular;  Laterality: N/A;  . CATARACT EXTRACTION, BILATERAL  2015  . CHOLECYSTECTOMY  1980's  . COLON SURGERY  2004   colectomy for diverticulosis  . CORONARY ANGIOPLASTY WITH STENT PLACEMENT  2000    and 08/11/2012; 11/12/2012: 3 + 2 LAD & CFX; 2nd CFX stent 11/12/2012  . CORONARY ATHERECTOMY N/A 07/15/2016   Procedure: Coronary Atherectomy;  Surgeon: Martinique, Peter M, MD;  Location: [place] CV LAB;  Service: Cardiovascular;  Laterality: N/A;  . CORONARY BALLOON ANGIOPLASTY N/A 07/14/2016   Procedure: Coronary Balloon Angioplasty;  Surgeon:  Martinique, Peter M, MD;  Location: [place] CV LAB;  Service: Cardiovascular;  Laterality: N/A;  . CORONARY STENT INTERVENTION N/A 07/15/2016   Procedure: Coronary Stent Intervention;  Surgeon: Martinique, Peter M, MD;  Location: [place] CV LAB;  Service: Cardiovascular;  Laterality: N/A;  . FRACTIONAL FLOW RESERVE WIRE  10/07/2013   Procedure: [place];  Surgeon: [name], MD;  Location: [facility] CATH LAB;  Service: Cardiovascular;;  . HERNIA REPAIR  2005   "laparoscopic"  . IR IMAGING GUIDED PORT INSERTION  10/28/2017  . LEFT HEART CATH AND CORONARY ANGIOGRAPHY N/A 07/14/2016   Procedure: Left Heart Cath and Coronary Angiography;  Surgeon: Martinique, Peter M, MD;  Location: [place] CV LAB;  Service: Cardiovascular;  Laterality: N/A;  . LEFT HEART CATH AND CORONARY ANGIOGRAPHY N/A 11/10/2018   Procedure: LEFT HEART CATH AND CORONARY ANGIOGRAPHY;  Surgeon: Wellington Hampshire, MD;  Location: [place] CV LAB;  Service: Cardiovascular;  Laterality: N/A;  . LEFT HEART CATHETERIZATION WITH CORONARY ANGIOGRAM N/A 11/12/2012   Procedure: LEFT HEART CATHETERIZATION WITH CORONARY ANGIOGRAM;  Surgeon: [name], MD;  Location: [facility] CATH LAB;  Service: Cardiovascular;  Laterality: N/A;  . LEFT HEART CATHETERIZATION WITH CORONARY ANGIOGRAM N/A 10/07/2013   Procedure: LEFT HEART CATHETERIZATION WITH CORONARY ANGIOGRAM;  Surgeon: [name], MD;  Location: [facility] CATH LAB;  Service: Cardiovascular;  Laterality: N/A;  . LEFT HEART CATHETERIZATION WITH CORONARY ANGIOGRAM N/A 12/14/2013   Procedure: LEFT HEART CATHETERIZATION WITH CORONARY ANGIOGRAM;  Surgeon: Sinclair Grooms, MD;  Location: [facility] CATH LAB;  Service: Cardiovascular;  Laterality: N/A;  . LEFT HEART CATHETERIZATION WITH CORONARY ANGIOGRAM N/A 05/16/2014   Procedure: LEFT HEART CATHETERIZATION WITH CORONARY ANGIOGRAM;  Surgeon: Sherren Mocha, MD;  Location: [facility] CATH LAB;  Service: Cardiovascular;  Laterality: N/A;  .  PERCUTANEOUS CORONARY INTERVENTION-BALLOON ONLY  08/04/2012   Procedure: PERCUTANEOUS CORONARY INTERVENTION-BALLOON ONLY;  Surgeon: [name], MD;  Location:  [facility] CATH LAB;  Service: Cardiovascular;;  . PERCUTANEOUS CORONARY ROTOBLATOR INTERVENTION (PCI-R) N/A 08/11/2012   Procedure: PERCUTANEOUS CORONARY ROTOBLATOR INTERVENTION (PCI-R);  Surgeon: [name], MD;  Location: [facility] CATH LAB;  Service: Cardiovascular;  Laterality: N/A;  . PERCUTANEOUS CORONARY STENT INTERVENTION (PCI-S)  10/07/2013   Procedure: PERCUTANEOUS CORONARY STENT INTERVENTION (PCI-S);  Surgeon: [name], MD;  Location: [facility] CATH LAB;  Service: Cardiovascular;;  . PERMANENT PACEMAKER INSERTION N/A 03/16/2012   [name] (SJM) leadless pacemaker (LEADLESS II STUDY PATEINT)  . SALIVARY GLAND SURGERY  2000's   "had a little lump removed; granulosa related; it was benign" (08/11/2012)  . TEE WITHOUT CARDIOVERSION  12/31/2011   Procedure: TRANSESOPHAGEAL ECHOCARDIOGRAM (TEE);  Surgeon: [name], MD;  Location: [facility] ENDOSCOPY;  Service: Cardiovascular;  Laterality: N/A;  . UMBILICAL GRANULOMA EXCISION  2000   2003; 2004; 2007: "all in my abdomen including small intestines, outside my ?uterus/etc" (08/11/2012)  . VARICOSE VEIN SURGERY Bilateral 1977     Home Medications:  Prior to Admission medications   Medication Sig Start Date End Date Taking? Authorizing Provider  acetaminophen (TYLENOL) 500 MG tablet Take 500 mg by mouth every 6 (six) hours as needed for mild pain or headache.    Yes [provider]  atorvastatin (LIPITOR) 80 MG tablet TAKE 1 TABLET BY MOUTH EVERY DAY IN THE MORNING Patient taking differently: Take 80 mg by mouth daily.  04/04/19  Yes [name], MD  beta carotene w/minerals (OCUVITE) tablet Take 1 tablet by mouth 2 (two) times daily.    Yes [provider]  clopidogrel (PLAVIX) 75 MG tablet TAKE 1 TABLET BY MOUTH EVERY DAY Patient taking differently: Take 75  mg by mouth daily.  03/15/19  Yes [name], MD  Coenzyme Q-10 100 MG capsule Take 200 mg by mouth at bedtime.    Yes [provider]  dofetilide (TIKOSYN) 125 MCG capsule Take 1 capsule (125 mcg total) by mouth 2 (two) times daily. 12/16/18  Yes Sherran Needs, NP  furosemide (LASIX) 40 MG tablet Take 2 tablets by mouth every morning and 2 additional tabs as needed for wt gain/abdominal distension Patient taking differently: Take 40 mg by mouth See admin instructions. Take 2 tablets by mouth every morning and 2 additional tabs as needed for wt gain/abdominal distension  03/30/19  Yes [name], MD  gabapentin (NEURONTIN) 300 MG capsule TAKE 1 CAPSULE BY MOUTH TWICE A  DAY Patient taking differently: Take 300 mg by mouth 2 (two) times daily.  04/25/19  Yes Heath Lark, MD  isosorbide mononitrate (IMDUR) 60 MG 24 hr tablet Take 1.5 tablets (90 mg total) by mouth daily. 05/31/19 09/28/19 Yes Nita Sells, MD  magnesium oxide (MAG-OX) 400 (241.3 Mg) MG tablet Take 2 tablets (800 mg total) by mouth daily. 05/31/19  Yes Nita Sells, MD  metoprolol (TOPROL-XL) 200 MG 24 hr tablet Take 1 tablet (200 mg total) by mouth daily. Take with or immediately following a meal. 12/10/18  Yes Sherran Needs, NP  nitroGLYCERIN (NITROSTAT) 0.4 MG SL tablet PLACE 1 TABLET (0.4 MG TOTAL) UNDER THE TONGUE EVERY 5 (FIVE) MINUTES AS NEEDED FOR CHEST PAIN Patient taking differently: Place 0.4 mg under the tongue every 5 (five) minutes as needed for chest pain.  05/26/19  Yes Sherran Needs, NP  potassium chloride SA (KLOR-CON) 20 MEQ tablet Take 1 tablet (20 mEq total) by mouth 2 (two) times daily. 01/21/19  Yes Eryc Bodey, Jeneen Rinks, MD  warfarin (COUMADIN) 2 MG tablet TAKE AS DIRECTED BY COUMADIN CLINIC Patient taking differently: Take 1-2 mg by mouth See admin instructions. Take 1 tablet on Tuesday, Thursday, Saturday, and Sunday and take 1/2 on Monday, Wednesday, and Friday. 04/22/19  Yes  [name], MD  lidocaine-prilocaine (EMLA) cream Apply to [facility] site 1-2 hours prior to access Patient not taking: Reported on 05/28/2019 09/30/18   Joylene John D, NP    Inpatient Medications: Scheduled Meds: . atorvastatin  80 mg Oral Daily  . [START ON 06/10/2019] furosemide  40 mg Intravenous Daily  . gabapentin  300 mg Oral BID  . isosorbide mononitrate  90 mg Oral Daily  . magnesium oxide  800 mg Oral Daily  . [START ON 06/10/2019] metoprolol  200 mg Oral Daily  . potassium chloride SA  20 mEq Oral BID   Continuous Infusions:  PRN Meds: acetaminophen **OR** acetaminophen, nitroGLYCERIN, ondansetron (ZOFRAN) IV  Allergies:    Allergies  Allergen Reactions  . Bee Venom Anaphylaxis  . Other Nausea And Vomiting and Other (See Comments)    Pain medications cause severe vomiting. Tolerated slow IV morphine drip  . Oxycodone Hcl Nausea And Vomiting  . Amiodarone Nausea Only  . Chlorhexidine   . Prednisone Palpitations and Other (See Comments)    "Rapid Heart Beat"    Social History:   Social History   Socioeconomic History  . Marital status: Widowed    Spouse name: Not on file  . Number of children: 4  . Years of education: Not on file  . Highest education level: Not on file  Occupational History  . Occupation: Retired Nurse, learning disability estate  Tobacco Use  . Smoking status: Former Smoker    Packs/day: 1.00    Years: 32.00    Pack years: 32.00    Types: Cigarettes    Quit date: 02/03/1974    Years since quitting: 45.3  . Smokeless tobacco: Never Used  Substance and Sexual Activity  . Alcohol use: Yes    Alcohol/week: 7.0 standard drinks    Types: 7 Glasses of wine per week    Comment: one glass wine nightly with dinner  . Drug use: No  . Sexual activity: Never  Other Topics Concern  . Not on file  Social History Narrative   Lives alone.   Social Determinants of Health   Financial Resource Strain:   . Difficulty of Paying Living Expenses:   Food  Insecurity:   .  Worried About Charity fundraiser in the Last Year:   . Arboriculturist in the Last Year:   Transportation Needs:   . Film/video editor (Medical):   Marland Kitchen Lack of Transportation (Non-Medical):   Physical Activity:   . Days of Exercise per Week:   . Minutes of Exercise per Session:   Stress:   . Feeling of Stress :   Social Connections:   . Frequency of Communication with Friends and Family:   . Frequency of Social Gatherings with Friends and Family:   . Attends Religious Services:   . Active Member of Clubs or Organizations:   . Attends Archivist Meetings:   Marland Kitchen Marital Status:   Intimate Partner Violence:   . Fear of Current or Ex-Partner:   . Emotionally Abused:   Marland Kitchen Physically Abused:   . Sexually Abused:     Family History:   Family History  Problem Relation Age of Onset  . Stroke Mother   . Heart attack Father   . Diabetes Father   . Hypertension Father   . Heart attack Brother   . Diabetes Brother   . Hypertension Brother   . Kidney failure Brother      ROS:  Please see the history of present illness.  All other ROS reviewed and negative.     Physical Exam/Data:   Vitals:   06/09/19 1000 06/09/19 1015 06/09/19 1030 06/09/19 1100  BP: (!) 119/57 119/70 120/79 123/65  Pulse: 68 (!) 104 73 71  Resp: 16 (!) 25 (!) 22 (!) 27  Temp:      TempSrc:      SpO2: 91% 100% 99% 99%  Weight:      Height:        Intake/Output Summary (Last 24 hours) at 06/09/2019 1154 Last data filed at 06/09/2019 1045 Gross per 24 hour  Intake 50 ml  Output 600 ml  Net -550 ml   Last 3 Weights 06/08/2019 05/31/2019 05/30/2019  Weight (lbs) 170 lb 173 lb 14.4 oz 172 lb  Weight (kg) 77.111 kg 78.881 kg 78.019 kg     Body mass index is 33.2 kg/m.  General:  Well nourished, well developed, in no acute distress HEENT: normal Lymph: no adenopathy Neck: no JVD Endocrine:  No thryomegaly Vascular: No carotid bruits; FA pulses 2+ bilaterally without bruits    Cardiac:  irreg-irreg; no murmurs, gallops or rubs Lungs:  Diminished at the bases, clear otherwise, no wheezing, rhonchi or rales  Abd: soft, nontender Ext: no edema Musculoskeletal:  No deformities Skin: warm and dry  Neuro: no focal abnormalities noted Psych:  Normal affect   EKG:  The EKG was personally reviewed and demonstrates:   AFlutter, 108bpm, QT is ok when accounting for QRS 164ms  Telemetry:  Telemetry was personally reviewed and demonstrates:   AFib/flutter, [id number]   Relevant CV Studies:  05/29/2019: TTE IMPRESSIONS  1. Left ventricular ejection fraction, by estimation, is 40 to 45%. The  left ventricle has moderately decreased function. The left ventricle  demonstrates regional wall motion abnormalities (see scoring  diagram/findings for description). There is severe  left ventricular hypertrophy. Left ventricular diastolic parameters are  indeterminate.  2. Right ventricular systolic function is mildly reduced. The right  ventricular size is normal.  3. Left atrial size was moderately dilated.  4. The mitral valve is degenerative. Trivial mitral valve regurgitation.  5. The aortic valve is abnormal. Aortic valve regurgitation is mild. Mild  aortic valve sclerosis  is present, with no evidence of aortic valve  stenosis.  6. The inferior vena cava is normal in size with greater than 50%  respiratory variability, suggesting right atrial pressure of 3 mmHg.    11/10/2018: LHC  Ost LAD to Mid LAD lesion is 20% stenosed.  Mid LAD lesion is 30% stenosed.  Ost 2nd Mrg to 2nd Mrg lesion is 60% stenosed.  Ost 1st Mrg to 1st Mrg lesion is 100% stenosed.  Ost RPDA to RPDA lesion is 50% stenosed.  Ost Cx to Prox Cx lesion is 40% stenosed.  Prox Cx to Mid Cx lesion is 20% stenosed.  1st Mrg lesion is 100% stenosed.  Previously placed Dist Cx stent (unknown type) is widely patent.  2nd Mrg lesion is 90% stenosed.  There is severe left ventricular  systolic dysfunction.  LV end diastolic pressure is mildly elevated.  The left ventricular ejection fraction is less than 25% by visual estimate.  Ost RCA to Mid RCA lesion is 40% stenosed.  Mid RCA to Dist RCA lesion is 20% stenosed.   1.  Underlying three-vessel coronary artery disease with widely patent stents in the LAD, left circumflex and right coronary arteries.  OM1 is occluded with collaterals.  OM 2 is diffusely diseased and small.  There is moderate restenosis in the ostial left circumflex stent.  Mild to moderate restenosis in the RCA stents.  Ostial left circumflex stents appears to have significant restenosis at the ostium in certain views but not in other views.  I reviewed previous cardiac catheterization and the appearance does not appear significantly different. 2.  Severely reduced LV systolic function with an EF of 20 to 25% with mildly elevated left ventricular end-diastolic pressure.  Recommendations: The patient was in A. fib with RVR during the procedure which made it difficult.  She was given 2 doses of IV metoprolol.  I suspect that her elevated troponin is likely due to supply demand ischemia in the setting of worsening cardiomyopathy and A. fib with RVR.  Recommend EP consultation to manage atrial fibrillation.  Resume heparin 8 hours after sheath pull.  I discontinued diltiazem given degree of cardiomyopathy.  Overall prognosis is poor given age and multiple comorbidities.  Laboratory Data:  High Sensitivity Troponin:   Recent Labs  Lab 05/28/19 0228 05/28/19 0453 05/28/19 0650 06/08/19 2358 06/09/19 0238  TROPONINIHS 61* 73* 98* 237* 244*     Chemistry Recent Labs  Lab 06/08/19 2358  NA 142  K 3.9  CL 100  CO2 30  GLUCOSE 122*  BUN 21  CREATININE 1.13*  CALCIUM 8.7*  GFRNONAA 44*  GFRAA 51*  ANIONGAP 12    No results for input(s): PROT, ALBUMIN, AST, ALT, ALKPHOS, BILITOT in the last 168 hours. Hematology Recent Labs  Lab 06/08/19 2358    WBC 5.9  RBC 4.16  HGB 12.8  HCT 41.4  MCV 99.5  MCH 30.8  MCHC 30.9  RDW 14.6  PLT 309   BNP Recent Labs  Lab 06/09/19 0011  BNP 1,092.6*    DDimer No results for input(s): DDIMER in the last 168 hours.   Radiology/Studies:  DG Chest 2 View Result Date: 06/09/2019 CLINICAL DATA:  86 year old female with chest pain EXAM: CHEST - 2 VIEW COMPARISON:  Chest radiograph dated 05/30/2019. FINDINGS: There is cardiomegaly. Small bilateral pleural effusions. Bibasilar densities may represent atelectasis or infiltrate. No pneumothorax. Left pectoral pacemaker device. Right chest wall Port-A-Cath in similar position. No acute osseous pathology. IMPRESSION: Small bilateral pleural effusions  and bibasilar atelectasis/infiltrate. Electronically Signed   By: Anner Crete M.D.   On: 06/09/2019 00:37     Assessment and Plan:   1. DOE/CP 2. CAD     She describes a pressure that she perceives as accociated with her SOB "not my heart"     Cardiology team on board, saw early this AM     Mentioned HS Trop high then last admission, poss demand 2/2 AFib, could consider LHC     Hep gtt stopped given coumadin tox     Will defer to cardiology any plans for repeat cath, CAD management     She is very comfortable in the ER, no CP, pressure or SOB currently  3. CHF exacerbation (combined)      Lasix given in ER and scheduled IV lasix ordered      She is sat 99-100 on RA      No rest SOB  4. AFib/flutter     Interrogation today notes some degree of undersensing her AF     In d/w [facility] rep, perhaps has been in AF since the week of April 23     Rates generally <110, largely 60-100     The patient took her Tikosyn last night as usual     She failed amiodarone some years back 2/2 nausea  INR is 5.1, no bleeding reported, H/H stable, follow   In regards to her cancer, she reports being old that continuing treatment likely to make her feel worse, and could affect her heart, was decided that given  slow growth historically and would prefer to feel well and not pursue more chemo. She is open to cardiac interventions if needed. She confirmed her current DNR status as correct (medicines and NIV) no CPR, defib, intubation           Dr. [name] has seen and examined the patient.  She has done well from AF burden standpoint on the Tikosyn until recently she would like to continue the Tikosyn.   Discussed DCCV with her, she is agreeable. Will resume her Tikosyn [employer] given she has had some diuresis for any K+ supplementation needed She is on the board for DCCV tomorrow.  She has had therapeutic INRs and is 5.1 now   Will defer ongoing management of patient and her HF to cardiology team. Recommend switching to xarelto once INR <3.0          For questions or updates, please contact [place] Please consult [email or web address] for contact info under     Signed, Baldwin Jamaica, PA-C  06/09/2019 11:54 AM   I have seen, examined the patient, and reviewed the above assessment and plan.  Changes to above are made where necessary.  On exam, iRRR.  She presents with recurrent diastolic dysfunction/ CHF symptoms as well as chest pain.  She has persistent afib which has been present for several weeks.  Her INR is supratherapeutic.  Unfortunately, she has not tolerated amiodarone previously.  Phyllis Ginger is therefore our only AAD option at this time.  V rates are reasonably controlled, so I do not feel that AV nodal ablation would be helpful.  Given her advanced age, I would be reluctant to consider ablation.  She is admitted to medicine with general cardiology to follow. We will arrange cardioversion tomorrow.  We discussed [place] therapy at length today.  Given her supratherapeutic INR, I worry about risks of coumadin. She is willing to switch to xarelto. We will give her  first dose once her INR is < 3.  Follow-up in AF clinic next week.  General cardiology to assist with CHF  management.  Electrophysiology team to see as needed while here. Please call with questions.   Co Sign: Thompson Grayer, MD 06/09/2019 4:51 PM

## 2019-06-09 NOTE — Consult Note (Signed)
[facility]: Chart Reviewed. This patient is active with [facility] a [place]. Office [phone]. Will continue to follow hospital course. Doroteo Glassman, RN [facility]

## 2019-06-09 NOTE — ED Notes (Signed)
Lunch Tray Ordered @ 1042. 

## 2019-06-09 NOTE — ED Notes (Signed)
Notified Dr. Antonieta Pert of patient's INR level of 5.1.

## 2019-06-09 NOTE — Consult Note (Addendum)
Cardiology Consultation:   Patient ID: Patty [name] MRN: [id number]; DOB: 12-Nov-1930  Admit date: 06/08/2019 Date of Consult: 06/09/2019  Primary Care Provider: [name], MD Primary Cardiologist: Larae Grooms, MD  Primary Electrophysiologist:  Thompson Grayer, MD   Patient Profile:   Patty [name] is a 86 y.o. female with a hx of LBBB, CKD (III), chronic CHF (combined), CAD (severe, stenting x 2 to the LAD and x 1 to the LCx in 2000, rotational arthrectomy to proximal LCx and DES to LAD in 2014 and DES to LCx in 2014, along with DES to LAD for -in-stent stenosis in 2015, and DES to ostial LAD in 2016. Most recent [place] in Oct 2020 showed stable 3V CAD, no lesions amenable to revascularization. Medical tx was recommended), HTN, HLD, PPM, persistent AFib,chronic  granulosa  disease  who is being seen today for the evaluation of AFib rhythm management options at the request of Dr. Hassell Done.   Device information SJM dual chamber PPM,  Implanted 01/26/2017  History of Present Illness:   [name] last saw Dr. [name] via telehealth visit Nov 2020, she was feeling great.  At that time there was some discussion that she may end up requiring AV node ablation and upgrade of her device to CRT, though she was feeling well and planned to monitor her AF burden.  She was at the AFib clinic 05/26/19, in AFlutter then though noted a recent device transission with ony 7% burden.  She was unaware of her rhythm.  74bpm.  Discussed that her PMD noted she had some progression of her Granulosa disease, was taken off her tamoxifen with plans to focus on quality of life, referred to palliative, and patient was comfortable with this decision. Her dofetilide was continued.  4/22/ hospitalized w/CP and rate controlled AFlutter.  Last cardiology notes device interrogation noting burden of 6% and continued on her Tikosyn. The patient mentioned that despite a palliative approach to her cancer, she would be  agreeable to cardiac interventions if needed. She has known CAD, her Trop were elevated 53-98, though no new w/u undertaken with stable echo findings.  She was diuresed and eventually discharged  She was admitted early this AM with a week of increasing DOE, and 1-2 days of chest pressure.   LABS K+ 3.9 BUN/Creat 21/1.13 Mag 2.1 BNP 1092 HS Trop 237, 224 WBC 5.9 H/H 12/41 (stable from 05/31/19) Plts 309 INR 5.0 > 5.1   She tells me since late Feb at least she has nt felt as well as she had ben last year.  She traveled over the holidays and felt great, climbing steps without difficulty.  She suspects her AFib has been acting up the last 2-3 months.  She says this week more so has felt fluttering of her AF when up and around feels her HR fast, and has been feeling more SOB, able to do less.  When doingcmore exertional activities she does get some pressure in her chest, though she perceives this as her lungs/chest muscles from breathing hard.  She recovers fairly quickly with rest. No dizzy spells, no near syncope or syncope. She denies "CP"  She has not been eating as usual for about 6 week, decreased appetite, less greens and attributes her high INR to this.  She has noted any bleeding   Past Medical History:  Diagnosis Date  . Bleeding behind the abdominal cavity 10/2017  . Chronic anticoagulation - coumadin, CHADS2VASC=6 05/17/2015  . CKD (chronic kidney disease), stage III   .  Combined systolic and diastolic heart failure ([place])   . Coronary artery disease    a. s/p multiple stents - stenting x 2 to the LAD and x 1 to the LCx in 2000, rotational arthrectomy to proximal LCx and DES to LAD in 2014 and DES to LCx in 2014, along with DES to LAD for re-in-stent stenosis in 2015, and DES to ostial LAD in 2016. b. 07/2016 - orbital atherectomy & DES to mid LCx.  . Diverticulitis   . Granulosa cell carcinoma ([place])    abd; last episode was in 2009  . Hyperlipidemia   . Hypertension   .  LBBB (left bundle branch block)   . Myocardial infarction ([place]) 2002  . Obesity   . OSA on CPAP   . Ovarian ca ([place]) 2019  . Pacemaker failure    a. Prior leadless PPM with premature battery failure, being managed conservatively without replacement.  . Peripheral vascular disease ([place])   . Permanent atrial fibrillation ([place]) 2013  . Renal artery stenosis ([place])   . Tachycardia-bradycardia syndrome ([place])    a. s/p leadless pacemaker ([name]) implanted by Dr [name]  . Varicose veins     Past Surgical History:  Procedure Laterality Date  . ABDOMINAL HYSTERECTOMY    . CARDIAC CATHETERIZATION  09/03/2007   EF 70%; Failed attempt at PCI to OM  . CARDIAC CATHETERIZATION  11/01/2003   EF 70%  . CARDIAC CATHETERIZATION N/A 12/14/2015   Procedure: Left Heart Cath and Coronary Angiography;  Surgeon: Burnell Blanks, MD;  Location: [place] CV LAB;  Service: Cardiovascular;  Laterality: N/A;  . CARDIOVERSION  12/31/2011   Procedure: CARDIOVERSION;  Surgeon: [name], MD;  Location: [facility] ENDOSCOPY;  Service: Cardiovascular;  Laterality: N/A;  . CARDIOVERSION N/A 12/31/2011   Procedure: CARDIOVERSION;  Surgeon: [name], MD;  Location: [facility] CATH LAB;  Service: Cardiovascular;  Laterality: N/A;  . CARDIOVERSION N/A 11/15/2018   Procedure: CARDIOVERSION;  Surgeon: Dorothy Spark, MD;  Location: [place];  Service: Cardiovascular;  Laterality: N/A;  . CATARACT EXTRACTION, BILATERAL  2015  . CHOLECYSTECTOMY  1980's  . COLON SURGERY  2004   colectomy for diverticulosis  . CORONARY ANGIOPLASTY WITH STENT PLACEMENT  2000    and 08/11/2012; 11/12/2012: 3 + 2 LAD & CFX; 2nd CFX stent 11/12/2012  . CORONARY ATHERECTOMY N/A 07/15/2016   Procedure: Coronary Atherectomy;  Surgeon: Martinique, Peter M, MD;  Location: [place] CV LAB;  Service: Cardiovascular;  Laterality: N/A;  . CORONARY BALLOON ANGIOPLASTY N/A 07/14/2016   Procedure: Coronary Balloon Angioplasty;  Surgeon:  Martinique, Peter M, MD;  Location: [place] CV LAB;  Service: Cardiovascular;  Laterality: N/A;  . CORONARY STENT INTERVENTION N/A 07/15/2016   Procedure: Coronary Stent Intervention;  Surgeon: Martinique, Peter M, MD;  Location: [place] CV LAB;  Service: Cardiovascular;  Laterality: N/A;  . FRACTIONAL FLOW RESERVE WIRE  10/07/2013   Procedure: [place];  Surgeon: [name], MD;  Location: [facility] CATH LAB;  Service: Cardiovascular;;  . HERNIA REPAIR  2005   "laparoscopic"  . IR IMAGING GUIDED PORT INSERTION  10/28/2017  . LEFT HEART CATH AND CORONARY ANGIOGRAPHY N/A 07/14/2016   Procedure: Left Heart Cath and Coronary Angiography;  Surgeon: Martinique, Peter M, MD;  Location: [place] CV LAB;  Service: Cardiovascular;  Laterality: N/A;  . LEFT HEART CATH AND CORONARY ANGIOGRAPHY N/A 11/10/2018   Procedure: LEFT HEART CATH AND CORONARY ANGIOGRAPHY;  Surgeon: Wellington Hampshire, MD;  Location: [place] CV LAB;  Service: Cardiovascular;  Laterality: N/A;  . LEFT HEART CATHETERIZATION WITH CORONARY ANGIOGRAM N/A 11/12/2012   Procedure: LEFT HEART CATHETERIZATION WITH CORONARY ANGIOGRAM;  Surgeon: [name], MD;  Location: [facility] CATH LAB;  Service: Cardiovascular;  Laterality: N/A;  . LEFT HEART CATHETERIZATION WITH CORONARY ANGIOGRAM N/A 10/07/2013   Procedure: LEFT HEART CATHETERIZATION WITH CORONARY ANGIOGRAM;  Surgeon: [name], MD;  Location: [facility] CATH LAB;  Service: Cardiovascular;  Laterality: N/A;  . LEFT HEART CATHETERIZATION WITH CORONARY ANGIOGRAM N/A 12/14/2013   Procedure: LEFT HEART CATHETERIZATION WITH CORONARY ANGIOGRAM;  Surgeon: Sinclair Grooms, MD;  Location: [facility] C[facility] LAB;  Service: Cardiovascular;  Laterality: N/A;  . LEFT HEART CATHETERIZATION WITH CORONARY ANGIOGRAM N/A 05/16/2014   Procedure: LEFT HEART CATHETERIZATION WITH CORONARY ANGIOGRAM;  Surgeon: Sherren Mocha, MD;  Location: [facility] CATH LAB;  Service: Cardiovascular;  Laterality: N/A;  .  PERCUTANEOUS CORONARY INTERVENTION-BALLOON ONLY  08/04/2012   Procedure: PERCUTANEOUS CORONARY INTERVENTION-BALLOON ONLY;  Surgeon: [name], MD;  Location: [facility] CATH LAB;  Service: Cardiovascular;;  . PERCUTANEOUS CORONARY ROTOBLATOR INTERVENTION (PCI-R) N/A 08/11/2012   Procedure: PERCUTANEOUS CORONARY ROTOBLATOR INTERVENTION (PCI-R);  Surgeon: [name], MD;  Location: [facility] CATH LAB;  Service: Cardiovascular;  Laterality: N/A;  . PERCUTANEOUS CORONARY STENT INTERVENTION (PCI-S)  10/07/2013   Procedure: PERCUTANEOUS CORONARY STENT INTERVENTION (PCI-S);  Surgeon: [name], MD;  Location: [facility] CATH LAB;  Service: Cardiovascular;;  . PERMANENT PACEMAKER INSERTION N/A 03/16/2012   [name] (SJM) leadless pacemaker (LEADLESS II STUDY PATEINT)  . SALIVARY GLAND SURGERY  2000's   "had a little lump removed; granulosa related; it was benign" (08/11/2012)  . TEE WITHOUT CARDIOVERSION  12/31/2011   Procedure: TRANSESOPHAGEAL ECHOCARDIOGRAM (TEE);  Surgeon: [name], MD;  Location: [facility] ENDOSCOPY;  Service: Cardiovascular;  Laterality: N/A;  . UMBILICAL GRANULOMA EXCISION  2000   2003; 2004; 2007: "all in my abdomen including small intestines, outside my ?uterus/etc" (08/11/2012)  . VARICOSE VEIN SURGERY Bilateral 1977     Home Medications:  Prior to Admission medications   Medication Sig Start Date End Date Taking? Authorizing Provider  acetaminophen (TYLENOL) 500 MG tablet Take 500 mg by mouth every 6 (six) hours as needed for mild pain or headache.    Yes [provider]  atorvastatin (LIPITOR) 80 MG tablet TAKE 1 TABLET BY MOUTH EVERY DAY IN THE MORNING Patient taking differently: Take 80 mg by mouth daily.  04/04/19  Yes [name], MD  beta carotene w/minerals (OCUVITE) tablet Take 1 tablet by mouth 2 (two) times daily.    Yes [provider]  clopidogrel (PLAVIX) 75 MG tablet TAKE 1 TABLET BY MOUTH EVERY DAY Patient taking differently: Take 75  mg by mouth daily.  03/15/19  Yes [name], MD  Coenzyme Q-10 100 MG capsule Take 200 mg by mouth at bedtime.    Yes [provider]  dofetilide (TIKOSYN) 125 MCG capsule Take 1 capsule (125 mcg total) by mouth 2 (two) times daily. 12/16/18  Yes Sherran Needs, NP  furosemide (LASIX) 40 MG tablet Take 2 tablets by mouth every morning and 2 additional tabs as needed for wt gain/abdominal distension Patient taking differently: Take 40 mg by mouth See admin instructions. Take 2 tablets by mouth every morning and 2 additional tabs as needed for wt gain/abdominal distension  03/30/19  Yes [name], MD  gabapentin (NEURONTIN) 300 MG capsule TAKE 1 CAPSULE BY MOUTH TWICE A  DAY Patient taking differently: Take 300 mg by mouth 2 (two) times daily.  04/25/19  Yes Heath Lark, MD  isosorbide mononitrate (IMDUR) 60 MG 24 hr tablet Take 1.5 tablets (90 mg total) by mouth daily. 05/31/19 09/28/19 Yes Nita Sells, MD  magnesium oxide (MAG-OX) 400 (241.3 Mg) MG tablet Take 2 tablets (800 mg total) by mouth daily. 05/31/19  Yes Nita Sells, MD  metoprolol (TOPROL-XL) 200 MG 24 hr tablet Take 1 tablet (200 mg total) by mouth daily. Take with or immediately following a meal. 12/10/18  Yes Sherran Needs, NP  nitroGLYCERIN (NITROSTAT) 0.4 MG SL tablet PLACE 1 TABLET (0.4 MG TOTAL) UNDER THE TONGUE EVERY 5 (FIVE) MINUTES AS NEEDED FOR CHEST PAIN Patient taking differently: Place 0.4 mg under the tongue every 5 (five) minutes as needed for chest pain.  05/26/19  Yes Sherran Needs, NP  potassium chloride SA (KLOR-CON) 20 MEQ tablet Take 1 tablet (20 mEq total) by mouth 2 (two) times daily. 01/21/19  Yes Charnita Trudel, Jeneen Rinks, MD  warfarin (COUMADIN) 2 MG tablet TAKE AS DIRECTED BY COUMADIN CLINIC Patient taking differently: Take 1-2 mg by mouth See admin instructions. Take 1 tablet on Tuesday, Thursday, Saturday, and Sunday and take 1/2 on Monday, Wednesday, and Friday. 04/22/19  Yes  [name], MD  lidocaine-prilocaine (EMLA) cream Apply to [facility] site 1-2 hours prior to access Patient not taking: Reported on 05/28/2019 09/30/18   Joylene John D, NP    Inpatient Medications: Scheduled Meds: . atorvastatin  80 mg Oral Daily  . [START ON 06/10/2019] furosemide  40 mg Intravenous Daily  . gabapentin  300 mg Oral BID  . isosorbide mononitrate  90 mg Oral Daily  . magnesium oxide  800 mg Oral Daily  . [START ON 06/10/2019] metoprolol  200 mg Oral Daily  . potassium chloride SA  20 mEq Oral BID   Continuous Infusions:  PRN Meds: acetaminophen **OR** acetaminophen, nitroGLYCERIN, ondansetron (ZOFRAN) IV  Allergies:    Allergies  Allergen Reactions  . Bee Venom Anaphylaxis  . Other Nausea And Vomiting and Other (See Comments)    Pain medications cause severe vomiting. Tolerated slow IV morphine drip  . Oxycodone Hcl Nausea And Vomiting  . Amiodarone Nausea Only  . Chlorhexidine   . Prednisone Palpitations and Other (See Comments)    "Rapid Heart Beat"    Social History:   Social History   Socioeconomic History  . Marital status: Widowed    Spouse name: Not on file  . Number of children: 4  . Years of education: Not on file  . Highest education level: Not on file  Occupational History  . Occupation: Retired Nurse, learning disability estate  Tobacco Use  . Smoking status: Former Smoker    Packs/day: 1.00    Years: 32.00    Pack years: 32.00    Types: Cigarettes    Quit date: 02/03/1974    Years since quitting: 45.3  . Smokeless tobacco: Never Used  Substance and Sexual Activity  . Alcohol use: Yes    Alcohol/week: 7.0 standard drinks    Types: 7 Glasses of wine per week    Comment: one glass wine nightly with dinner  . Drug use: No  . Sexual activity: Never  Other Topics Concern  . Not on file  Social History Narrative   Lives alone.   Social Determinants of Health   Financial Resource Strain:   . Difficulty of Paying Living Expenses:   Food  Insecurity:   .  Worried About Charity fundraiser in the Last Year:   . Arboriculturist in the Last Year:   Transportation Needs:   . Film/video editor (Medical):   Marland Kitchen Lack of Transportation (Non-Medical):   Physical Activity:   . Days of Exercise per Week:   . Minutes of Exercise per Session:   Stress:   . Feeling of Stress :   Social Connections:   . Frequency of Communication with Friends and Family:   . Frequency of Social Gatherings with Friends and Family:   . Attends Religious Services:   . Active Member of Clubs or Organizations:   . Attends Archivist Meetings:   Marland Kitchen Marital Status:   Intimate Partner Violence:   . Fear of Current or Ex-Partner:   . Emotionally Abused:   Marland Kitchen Physically Abused:   . Sexually Abused:     Family History:   Family History  Problem Relation Age of Onset  . Stroke Mother   . Heart attack Father   . Diabetes Father   . Hypertension Father   . Heart attack Brother   . Diabetes Brother   . Hypertension Brother   . Kidney failure Brother      ROS:  Please see the history of present illness.  All other ROS reviewed and negative.     Physical Exam/Data:   Vitals:   06/09/19 1000 06/09/19 1015 06/09/19 1030 06/09/19 1100  BP: (!) 119/57 119/70 120/79 123/65  Pulse: 68 (!) 104 73 71  Resp: 16 (!) 25 (!) 22 (!) 27  Temp:      TempSrc:      SpO2: 91% 100% 99% 99%  Weight:      Height:        Intake/Output Summary (Last 24 hours) at 06/09/2019 1154 Last data filed at 06/09/2019 1045 Gross per 24 hour  Intake 50 ml  Output 600 ml  Net -550 ml   Last 3 Weights 06/08/2019 05/31/2019 05/30/2019  Weight (lbs) 170 lb 173 lb 14.4 oz 172 lb  Weight (kg) 77.111 kg 78.881 kg 78.019 kg     Body mass index is 33.2 kg/m.  General:  Well nourished, well developed, in no acute distress HEENT: normal Lymph: no adenopathy Neck: no JVD Endocrine:  No thryomegaly Vascular: No carotid bruits; FA pulses 2+ bilaterally without bruits    Cardiac:  irreg-irreg; no murmurs, gallops or rubs Lungs:  Diminished at the bases, clear otherwise, no wheezing, rhonchi or rales  Abd: soft, nontender Ext: no edema Musculoskeletal:  No deformities Skin: warm and dry  Neuro: no focal abnormalities noted Psych:  Normal affect   EKG:  The EKG was personally reviewed and demonstrates:   AFlutter, 108bpm, QT is ok when accounting for QRS 150ms  Telemetry:  Telemetry was personally reviewed and demonstrates:   AFib/flutter, [id number]   Relevant CV Studies:  05/29/2019: TTE IMPRESSIONS  1. Left ventricular ejection fraction, by estimation, is 40 to 45%. The  left ventricle has moderately decreased function. The left ventricle  demonstrates regional wall motion abnormalities (see scoring  diagram/findings for description). There is severe  left ventricular hypertrophy. Left ventricular diastolic parameters are  indeterminate.  2. Right ventricular systolic function is mildly reduced. The right  ventricular size is normal.  3. Left atrial size was moderately dilated.  4. The mitral valve is degenerative. Trivial mitral valve regurgitation.  5. The aortic valve is abnormal. Aortic valve regurgitation is mild. Mild  aortic valve sclerosis  is present, with no evidence of aortic valve  stenosis.  6. The inferior vena cava is normal in size with greater than 50%  respiratory variability, suggesting right atrial pressure of 3 mmHg.    11/10/2018: LHC  Ost LAD to Mid LAD lesion is 20% stenosed.  Mid LAD lesion is 30% stenosed.  Ost 2nd Mrg to 2nd Mrg lesion is 60% stenosed.  Ost 1st Mrg to 1st Mrg lesion is 100% stenosed.  Ost RPDA to RPDA lesion is 50% stenosed.  Ost Cx to Prox Cx lesion is 40% stenosed.  Prox Cx to Mid Cx lesion is 20% stenosed.  1st Mrg lesion is 100% stenosed.  Previously placed Dist Cx stent (unknown type) is widely patent.  2nd Mrg lesion is 90% stenosed.  There is severe left ventricular  systolic dysfunction.  LV end diastolic pressure is mildly elevated.  The left ventricular ejection fraction is less than 25% by visual estimate.  Ost RCA to Mid RCA lesion is 40% stenosed.  Mid RCA to Dist RCA lesion is 20% stenosed.   1.  Underlying three-vessel coronary artery disease with widely patent stents in the LAD, left circumflex and right coronary arteries.  OM1 is occluded with collaterals.  OM 2 is diffusely diseased and small.  There is moderate restenosis in the ostial left circumflex stent.  Mild to moderate restenosis in the RCA stents.  Ostial left circumflex stents appears to have significant restenosis at the ostium in certain views but not in other views.  I reviewed previous cardiac catheterization and the appearance does not appear significantly different. 2.  Severely reduced LV systolic function with an EF of 20 to 25% with mildly elevated left ventricular end-diastolic pressure.  Recommendations: The patient was in A. fib with RVR during the procedure which made it difficult.  She was given 2 doses of IV metoprolol.  I suspect that her elevated troponin is likely due to supply demand ischemia in the setting of worsening cardiomyopathy and A. fib with RVR.  Recommend EP consultation to manage atrial fibrillation.  Resume heparin 8 hours after sheath pull.  I discontinued diltiazem given degree of cardiomyopathy.  Overall prognosis is poor given age and multiple comorbidities.  Laboratory Data:  High Sensitivity Troponin:   Recent Labs  Lab 05/28/19 0228 05/28/19 0453 05/28/19 0650 06/08/19 2358 06/09/19 0238  TROPONINIHS 61* 73* 98* 237* 244*     Chemistry Recent Labs  Lab 06/08/19 2358  NA 142  K 3.9  CL 100  CO2 30  GLUCOSE 122*  BUN 21  CREATININE 1.13*  CALCIUM 8.7*  GFRNONAA 44*  GFRAA 51*  ANIONGAP 12    No results for input(s): PROT, ALBUMIN, AST, ALT, ALKPHOS, BILITOT in the last 168 hours. Hematology Recent Labs  Lab 06/08/19 2358    WBC 5.9  RBC 4.16  HGB 12.8  HCT 41.4  MCV 99.5  MCH 30.8  MCHC 30.9  RDW 14.6  PLT 309   BNP Recent Labs  Lab 06/09/19 0011  BNP 1,092.6*    DDimer No results for input(s): DDIMER in the last 168 hours.   Radiology/Studies:  DG Chest 2 View Result Date: 06/09/2019 CLINICAL DATA:  86 year old female with chest pain EXAM: CHEST - 2 VIEW COMPARISON:  Chest radiograph dated 05/30/2019. FINDINGS: There is cardiomegaly. Small bilateral pleural effusions. Bibasilar densities may represent atelectasis or infiltrate. No pneumothorax. Left pectoral pacemaker device. Right chest wall Port-A-Cath in similar position. No acute osseous pathology. IMPRESSION: Small bilateral pleural effusions  and bibasilar atelectasis/infiltrate. Electronically Signed   By: Anner Crete M.D.   On: 06/09/2019 00:37     Assessment and Plan:   1. DOE/CP 2. CAD     She describes a pressure that she perceives as accociated with her SOB "not my heart"     Cardiology team on board, saw early this AM     Mentioned HS Trop high then last admission, poss demand 2/2 AFib, could consider LHC     Hep gtt stopped given coumadin tox     Will defer to cardiology any plans for repeat cath, CAD management     She is very comfortable in the ER, no CP, pressure or SOB currently  3. CHF exacerbation (combined)      Lasix given in ER and scheduled IV lasix ordered      She is sat 99-100 on RA      No rest SOB  4. AFib/flutter     Interrogation today notes some degree of undersensing her AF     In d/w [employer] rep, perhaps has been in AF since the week of April 23     Rates generally <110, largely 60-100     The patient took her Tikosyn last night as usual     She failed amiodarone some years back 2/2 nausea  INR is 5.1, no bleeding reported, H/H stable, follow   In regards to her cancer, she reports being old that continuing treatment likely to make her feel worse, and could affect her heart, was decided that given  slow growth historically and would prefer to feel well and not pursue more chemo. She is open to cardiac interventions if needed. She confirmed her current DNR status as correct (medicines and NIV) no CPR, defib, intubation           Dr. [name] has seen and examined the patient.  She has done well from AF burden standpoint on the Tikosyn until recently she would like to continue the Tikosyn.   Discussed DCCV with her, she is agreeable. Will resume her Tikosyn [employer] given she has had some diuresis for any K+ supplementation needed She is on the board for DCCV tomorrow.  She has had therapeutic INRs and is 5.1 now   Will defer ongoing management of patient and her HF to cardiology team. Recommend switching to xarelto once INR <3.0          For questions or updates, please contact [place] Please consult [email or web address] for contact info under     Signed, Baldwin Jamaica, PA-C  06/09/2019 11:54 AM   I have seen, examined the patient, and reviewed the above assessment and plan.  Changes to above are made where necessary.  On exam, iRRR.  She presents with recurrent diastolic dysfunction/ CHF symptoms as well as chest pain.  She has persistent afib which has been present for several weeks.  Her INR is supratherapeutic.  Unfortunately, she has not tolerated amiodarone previously.  Phyllis Ginger is therefore our only AAD option at this time.  V rates are reasonably controlled, so I do not feel that AV nodal ablation would be helpful.  Given her advanced age, I would be reluctant to consider ablation.  She is admitted to medicine with general cardiology to follow. We will arrange cardioversion tomorrow.  We discussed [place] therapy at length today.  Given her supratherapeutic INR, I worry about risks of coumadin. She is willing to switch to xarelto. We will give her  first dose once her INR is < 3.  Follow-up in AF clinic next week.  General cardiology to assist with CHF  management.  Electrophysiology team to see as needed while here. Please call with questions.   Co Sign: Thompson Grayer, MD 06/09/2019 4:51 PM

## 2019-06-10 ENCOUNTER — Encounter (HOSPITAL_COMMUNITY)
Admission: EM | Disposition: A | Discharge status: Hospice | Payer: Self-pay | Source: Home / Self Care | Attending: Internal Medicine

## 2019-06-10 ENCOUNTER — Inpatient Hospital Stay (HOSPITAL_COMMUNITY): Payer: Medicare Other | Admitting: Certified Registered"

## 2019-06-10 ENCOUNTER — Encounter (HOSPITAL_COMMUNITY): Payer: Self-pay | Admitting: Internal Medicine

## 2019-06-10 DIAGNOSIS — I495 Sick sinus syndrome: Secondary | ICD-10-CM | POA: Diagnosis present

## 2019-06-10 DIAGNOSIS — R072 Precordial pain: Secondary | ICD-10-CM | POA: Diagnosis not present

## 2019-06-10 DIAGNOSIS — I10 Essential (primary) hypertension: Secondary | ICD-10-CM | POA: Diagnosis not present

## 2019-06-10 DIAGNOSIS — N183 Chronic kidney disease, stage 3 unspecified: Secondary | ICD-10-CM | POA: Diagnosis present

## 2019-06-10 DIAGNOSIS — I429 Cardiomyopathy, unspecified: Secondary | ICD-10-CM | POA: Diagnosis not present

## 2019-06-10 DIAGNOSIS — Z7902 Long term (current) use of antithrombotics/antiplatelets: Secondary | ICD-10-CM | POA: Diagnosis not present

## 2019-06-10 DIAGNOSIS — I471 Supraventricular tachycardia: Secondary | ICD-10-CM | POA: Diagnosis not present

## 2019-06-10 DIAGNOSIS — I4821 Permanent atrial fibrillation: Secondary | ICD-10-CM | POA: Diagnosis not present

## 2019-06-10 DIAGNOSIS — I483 Typical atrial flutter: Secondary | ICD-10-CM | POA: Diagnosis not present

## 2019-06-10 DIAGNOSIS — I25118 Atherosclerotic heart disease of native coronary artery with other forms of angina pectoris: Secondary | ICD-10-CM | POA: Diagnosis not present

## 2019-06-10 DIAGNOSIS — I5042 Chronic combined systolic (congestive) and diastolic (congestive) heart failure: Secondary | ICD-10-CM | POA: Diagnosis not present

## 2019-06-10 DIAGNOSIS — I252 Old myocardial infarction: Secondary | ICD-10-CM | POA: Diagnosis not present

## 2019-06-10 DIAGNOSIS — Z823 Family history of stroke: Secondary | ICD-10-CM | POA: Diagnosis not present

## 2019-06-10 DIAGNOSIS — Z8249 Family history of ischemic heart disease and other diseases of the circulatory system: Secondary | ICD-10-CM | POA: Diagnosis not present

## 2019-06-10 DIAGNOSIS — I214 Non-ST elevation (NSTEMI) myocardial infarction: Secondary | ICD-10-CM | POA: Diagnosis not present

## 2019-06-10 DIAGNOSIS — C569 Malignant neoplasm of unspecified ovary: Secondary | ICD-10-CM | POA: Diagnosis not present

## 2019-06-10 DIAGNOSIS — Z66 Do not resuscitate: Secondary | ICD-10-CM | POA: Diagnosis not present

## 2019-06-10 DIAGNOSIS — I2510 Atherosclerotic heart disease of native coronary artery without angina pectoris: Secondary | ICD-10-CM | POA: Diagnosis not present

## 2019-06-10 DIAGNOSIS — I482 Chronic atrial fibrillation, unspecified: Secondary | ICD-10-CM

## 2019-06-10 DIAGNOSIS — I4892 Unspecified atrial flutter: Secondary | ICD-10-CM | POA: Diagnosis not present

## 2019-06-10 DIAGNOSIS — R0600 Dyspnea, unspecified: Secondary | ICD-10-CM | POA: Diagnosis not present

## 2019-06-10 DIAGNOSIS — I130 Hypertensive heart and chronic kidney disease with heart failure and stage 1 through stage 4 chronic kidney disease, or unspecified chronic kidney disease: Secondary | ICD-10-CM | POA: Diagnosis not present

## 2019-06-10 DIAGNOSIS — K5790 Diverticulosis of intestine, part unspecified, without perforation or abscess without bleeding: Secondary | ICD-10-CM | POA: Diagnosis present

## 2019-06-10 DIAGNOSIS — I5043 Acute on chronic combined systolic (congestive) and diastolic (congestive) heart failure: Secondary | ICD-10-CM

## 2019-06-10 DIAGNOSIS — Z20822 Contact with and (suspected) exposure to covid-19: Secondary | ICD-10-CM | POA: Diagnosis not present

## 2019-06-10 DIAGNOSIS — I248 Other forms of acute ischemic heart disease: Secondary | ICD-10-CM | POA: Diagnosis not present

## 2019-06-10 DIAGNOSIS — R778 Other specified abnormalities of plasma proteins: Secondary | ICD-10-CM | POA: Diagnosis not present

## 2019-06-10 DIAGNOSIS — G4733 Obstructive sleep apnea (adult) (pediatric): Secondary | ICD-10-CM | POA: Diagnosis present

## 2019-06-10 DIAGNOSIS — Z7901 Long term (current) use of anticoagulants: Secondary | ICD-10-CM | POA: Diagnosis not present

## 2019-06-10 DIAGNOSIS — I249 Acute ischemic heart disease, unspecified: Secondary | ICD-10-CM | POA: Diagnosis not present

## 2019-06-10 DIAGNOSIS — Z955 Presence of coronary angioplasty implant and graft: Secondary | ICD-10-CM | POA: Diagnosis not present

## 2019-06-10 DIAGNOSIS — E785 Hyperlipidemia, unspecified: Secondary | ICD-10-CM | POA: Diagnosis present

## 2019-06-10 DIAGNOSIS — E876 Hypokalemia: Secondary | ICD-10-CM | POA: Diagnosis not present

## 2019-06-10 DIAGNOSIS — I739 Peripheral vascular disease, unspecified: Secondary | ICD-10-CM | POA: Diagnosis present

## 2019-06-10 DIAGNOSIS — I447 Left bundle-branch block, unspecified: Secondary | ICD-10-CM | POA: Diagnosis present

## 2019-06-10 DIAGNOSIS — I4891 Unspecified atrial fibrillation: Secondary | ICD-10-CM | POA: Diagnosis not present

## 2019-06-10 HISTORY — PX: CARDIOVERSION: SHX1299

## 2019-06-10 LAB — PROTIME-INR
INR: 3.8 — ABNORMAL HIGH (ref 0.8–1.2)
Prothrombin Time: 36.3 seconds — ABNORMAL HIGH (ref 11.4–15.2)

## 2019-06-10 LAB — BASIC METABOLIC PANEL
Anion gap: 5 (ref 5–15)
BUN: 21 mg/dL (ref 8–23)
CO2: 32 mmol/L (ref 22–32)
Calcium: 8 mg/dL — ABNORMAL LOW (ref 8.9–10.3)
Chloride: 104 mmol/L (ref 98–111)
Creatinine, Ser: 1.01 mg/dL — ABNORMAL HIGH (ref 0.44–1.00)
GFR calc Af Amer: 58 mL/min — ABNORMAL LOW (ref >60)
GFR calc non Af Amer: 50 mL/min — ABNORMAL LOW (ref >60)
Glucose, Bld: 106 mg/dL — ABNORMAL HIGH (ref 70–99)
Potassium: 3.8 mmol/L (ref 3.5–5.1)
Sodium: 141 mmol/L (ref 135–145)

## 2019-06-10 SURGERY — CARDIOVERSION
Anesthesia: General

## 2019-06-10 MED ORDER — METOPROLOL TARTRATE 5 MG/5ML IV SOLN
5.0000 mg | Freq: Once | INTRAVENOUS | Status: AC
  Administered 2019-06-10: 5 mg via INTRAVENOUS
  Filled 2019-06-10: qty 5

## 2019-06-10 MED ORDER — POTASSIUM CHLORIDE CRYS ER 20 MEQ PO TBCR
30.0000 meq | EXTENDED_RELEASE_TABLET | Freq: Once | ORAL | Status: AC
  Administered 2019-06-10: 30 meq via ORAL
  Filled 2019-06-10: qty 1

## 2019-06-10 MED ORDER — PROPOFOL 10 MG/ML IV BOLUS
INTRAVENOUS | Status: DC | PRN
  Administered 2019-06-10: 40 mg via INTRAVENOUS
  Administered 2019-06-10: 20 mg via INTRAVENOUS

## 2019-06-10 MED ORDER — FUROSEMIDE 10 MG/ML IJ SOLN
40.0000 mg | Freq: Two times a day (BID) | INTRAMUSCULAR | Status: DC
  Administered 2019-06-10 – 2019-06-11 (×3): 40 mg via INTRAVENOUS
  Filled 2019-06-10 (×3): qty 4

## 2019-06-10 MED ORDER — LIDOCAINE HCL (CARDIAC) PF 100 MG/5ML IV SOSY
PREFILLED_SYRINGE | INTRAVENOUS | Status: DC | PRN
  Administered 2019-06-10: 40 mg via INTRAVENOUS

## 2019-06-10 MED ORDER — SODIUM CHLORIDE 0.9 % IV SOLN: INTRAVENOUS | Status: DC | PRN

## 2019-06-10 NOTE — Progress Notes (Signed)
[facility]:  Pt is active with [facility] at home. It is a home based Palliative Care program provided by [facility]. Pt is eligible to return home with palliative care services.   I Noted while reviewing chart the pt spoke with CM Colletta [place] and   mentioned that she would be interested in pursuing hospice care at home. We can help provide these services in the home as well if MD feels this is appropriate and pt is wanting to make that transition with [place]. She may choose a different hospice agency which is her choice as well.   Webb Silversmith RN [phone]

## 2019-06-10 NOTE — TOC Progression Note (Addendum)
Transition of Care [facility]) - Progression Note    Patient Details  Name: Patty [name] MRN: [id number] Date of Birth: [birth year]  Transition of Care [facility]) CM/SW Contact  Graves-Bigelow, Ocie Cornfield, RN Phone Number: 06/10/2019, 5:15 PM  Clinical Narrative:  Readmission risk assessment completed. Previous Case Manager spoke with patient in detail. Case Manger did discuss with patient that she is currently home alone and support of neighbors and daughter that lives in [place]. Patient still drives to all appointments- has primary care provider Dr. Delfina Redwood. Patient is active with [facility] and has palliative services via [facility] that patient would like resumed when she transitions home. Case Manager discussed home health services and she is agreeable to home with home health physical therapy with [facility]. This Case Manager was unable to make referral due to after 5:00 pm. Weekend Case Manager can assist with home health referral and DME Shower chair. Case Manager will continue to follow for additional transition of care needs.     1720 Benefits checks submitted for both Eliquis and Xarelto.   Expected Discharge Plan: Home w Hospice Care Barriers to Discharge: (P) Continued Medical Work up  Expected Discharge Plan and Services Expected Discharge Plan: [place]   Discharge Planning Services: CM Consult Post Acute Care Choice: Hospice, Home Health(Hospice or pallative care/ home health) Living arrangements for the past 2 months: Single Family Home                  Readmission Risk Interventions Readmission Risk Prevention Plan 06/10/2019 05/31/2019  Transportation Screening Complete Complete  HRI or Home Care Consult Complete -  Social Work Consult for [place] Planning/Counseling Complete -  Palliative Care Screening Complete -  Medication Review Press photographer) Complete Complete  PCP or Specialist appointment within 3-5 days of discharge - Complete  HRI or [place] - Not Applicable  Some recent data might be hidden

## 2019-06-10 NOTE — Anesthesia Postprocedure Evaluation (Signed)
Anesthesia Post Note  Patient: Patty [name]  Procedure(s) Performed: CARDIOVERSION (N/A )     Patient location during evaluation: PACU Anesthesia Type: General Level of consciousness: awake and alert Pain management: pain level controlled Vital Signs Assessment: post-procedure vital signs reviewed and stable Respiratory status: spontaneous breathing, nonlabored ventilation, respiratory function stable and patient connected to nasal cannula oxygen Cardiovascular status: blood pressure returned to baseline and stable Postop Assessment: no apparent nausea or vomiting Anesthetic complications: no    Last Vitals:  Vitals:   06/10/19 1027 06/10/19 1140  BP: (!) 147/58 (!) 120/51  Pulse: 71 71  Resp: (!) 21 (!) 21  Temp:  36.7 C  SpO2: 98% 96%    Last Pain:  Vitals:   06/10/19 1140  TempSrc: Oral  PainSc:                  Effie Berkshire

## 2019-06-10 NOTE — Progress Notes (Signed)
OT Cancellation Note  Patient Details Name: Patty [name] MRN: [id number] DOB: [birth year]   Cancelled Treatment:    Reason Eval/Treat Not Completed: Patient at procedure or test/ unavailable, transport arrived to take pt to endo per RN.  Will follow and see as able.  Jolaine Artist, OT Acute Rehabilitation Services Pager [phone] Office [place] 06/10/2019, 8:56 AM

## 2019-06-10 NOTE — CV Procedure (Signed)
CARDIOVERSION  Patient sedated by anesthesia with Propofol and lidocaine intravenously  With pads in AP position, patient cardioverted to SR with 200 J synchronized biphasic energy  Pacemaker interrogated  Procedure was without complication  Dorris Carnes MD

## 2019-06-10 NOTE — Progress Notes (Addendum)
RN called regarding QT AFlutter 113bpm Manually measure d QT 362ms, QRS 150ms, accounting for QRS duration QTc is OK OK to continue her Tikosyn, this is a longstanding home medicine I will go ahead and supplement her K+ given she is getting IV diuretics  For DCCV today  Please keep her K+ 4.0 or better   Continue ongoing management with attending cardiology team  Tommye Standard, PA-C

## 2019-06-10 NOTE — TOC Benefit Eligibility Note (Signed)
Transition of Care [facility]) Benefit Eligibility Note    Patient Details  Name: Patty [name] MRN: [id number] Date of Birth: 06-11-1930   Medication/Dose: Alveda Reasons 15 MG BID  Covered?: Yes  Tier: 2 Drug  Prescription Coverage [facility]   and [facility]  Spoke with Person/Company/Phone Number:: RENA   /    AMBER     @ [employer] [id number] # [phone]  Co-Pay: $29.10  Prior Approval: No  Deductible: (NO DEDUCTIBLE WITH PLAN)  Additional Notes: XARELTO 20 MG DAILY  COVER- YES    CO-PAY- $29.10   TIER- 2 DRUG   P/A- NO    Memory Argue Phone Number: 06/10/2019, 5:01 PM

## 2019-06-10 NOTE — Anesthesia Preprocedure Evaluation (Addendum)
Anesthesia Evaluation  Patient identified by MRN, date of birth, ID band Patient awake    Reviewed: Allergy & Precautions, NPO status , Patient's Chart, lab work & pertinent test results  Airway Mallampati: III  TM Distance: >3 FB Neck ROM: Full    Dental  (+) Teeth Intact   Pulmonary sleep apnea , former smoker,     + decreased breath sounds      Cardiovascular hypertension, Pt. on home beta blockers + CAD, + Past MI, + Cardiac Stents, + Peripheral Vascular Disease and +CHF  + dysrhythmias + pacemaker  Rhythm:Irregular Rate:Tachycardia     Neuro/Psych negative neurological ROS  negative psych ROS   GI/Hepatic negative GI ROS, Neg liver ROS,   Endo/Other  negative endocrine ROS  Renal/GU CRFRenal disease     Musculoskeletal   Abdominal Normal abdominal exam  (+)   Peds  Hematology   Anesthesia Other Findings   Reproductive/Obstetrics                            Anesthesia Physical Anesthesia Plan  ASA: III  Anesthesia Plan: General   Post-op Pain Management:    Induction: Intravenous  PONV Risk Score and Plan: 0 and Treatment may vary due to age or medical condition  Airway Management Planned: Natural Airway and Simple Face Mask  Additional Equipment: None  Intra-op Plan:   Post-operative Plan:   Informed Consent: I have reviewed the patients History and Physical, chart, labs and discussed the procedure including the risks, benefits and alternatives for the proposed anesthesia with the patient or authorized representative who has indicated his/her understanding and acceptance.   Patient has DNR.     Plan Discussed with: CRNA  Anesthesia Plan Comments: (Echo:  1. Left ventricular ejection fraction, by estimation, is 40 to 45%. The  left ventricle has moderately decreased function. The left ventricle  demonstrates regional wall motion abnormalities (see scoring   diagram/findings for description). There is severe  left ventricular hypertrophy. Left ventricular diastolic parameters are  indeterminate.  2. Right ventricular systolic function is mildly reduced. The right  ventricular size is normal.  3. Left atrial size was moderately dilated.  4. The mitral valve is degenerative. Trivial mitral valve regurgitation.  5. The aortic valve is abnormal. Aortic valve regurgitation is mild. Mild  aortic valve sclerosis is present, with no evidence of aortic valve  stenosis.  6. The inferior vena cava is normal in size with greater than 50%  respiratory variability, suggesting right atrial pressure of 3 mmHg. )       Anesthesia Quick Evaluation

## 2019-06-10 NOTE — Evaluation (Signed)
Physical Therapy Evaluation Patient Details Name: Patty [name] MRN: [id number] DOB: 06-08-1930 Today's Date: 06/10/2019   History of Present Illness  Pt is an 86 y/o female with PMH including  chronic atrial fibrillation on warfarin, tachycardia-bradycardia syndrome, pacemaker placement, chronic combined systolic and diastolic heart failure, history of MI, CAD with history of multiple stents, hypertension, renal artery stenosis, diverticulosis, diverticulitis, granulosa cell carcinoma, hyperlipidemia, LBBB, obesity, OSA on CPAP, ovarian cancer who is coming to the emergency department due to nonradiated, central chest pressure with worsening SOB and fatigue. Suspected elevated troponin due to supply demand ischemia in the setting of worsening cardiomyopathy and A. fib with RVR. Pt now s/p cardioversion.    Clinical Impression  Pt presented supine in bed with HOB elevated, awake and willing to participate in therapy session. Prior to admission, pt reported that she ambulated with use of a rollator and was independent with ADLs. Pt lives alone in a single level home with a level entry. Pt reporting that she is very familiar with HHPT and Cardiac Rehab from previous experiences. At the time of evaluation, pt limited secondary to generalized weakness and fatigue. Of note, pt initially on 2L of O2 but ambulated in room on RA with SpO2 decreasing to as low as 83%. She required a sitting rest break of several minutes with instruction in pursed-lip breathing and reapplication of 2L of O2 for SpO2 to return to low 90's. Pt's RN was notified. HR remained stable throughout. Pt would continue to benefit from skilled physical therapy services at this time while admitted and after d/c to address the below listed limitations in order to improve overall safety and independence with functional mobility.     Follow Up Recommendations Home health PT    Equipment Recommendations  3in1 (PT)    Recommendations for  Other Services       Precautions / Restrictions Precautions Precautions: Fall Precaution Comments: watch SpO2 Restrictions Weight Bearing Restrictions: No      Mobility  Bed Mobility Overal bed mobility: Modified Independent                Transfers Overall transfer level: Needs assistance Equipment used: Rolling walker (2 wheeled) Transfers: Sit to/from Stand Sit to Stand: Min guard         General transfer comment: for safety with transition  Ambulation/Gait Ambulation/Gait assistance: Min guard Gait Distance (Feet): 30 Feet Assistive device: Rolling walker (2 wheeled) Gait Pattern/deviations: Step-through pattern;Decreased stride length Gait velocity: decreased   General Gait Details: pt with steady, cautious gait with use of RW; limited in distance secondary to fatigue  Stairs            Wheelchair Mobility    Modified Rankin (Stroke Patients Only)       Balance Overall balance assessment: Needs assistance Sitting-balance support: Feet supported Sitting balance-Leahy Scale: Good     Standing balance support: Bilateral upper extremity supported;Single extremity supported Standing balance-Leahy Scale: Poor                               Pertinent Vitals/Pain Pain Assessment: No/denies pain    Home Living Family/patient expects to be discharged to:: Private residence Living Arrangements: Alone Available Help at Discharge: Family;Available PRN/intermittently Type of Home: House Home Access: Level entry     Home Layout: One level Home Equipment: Walker - 4 wheels;Grab bars - tub/shower;Grab bars - toilet;Hand held shower head  Prior Function Level of Independence: Independent with assistive device(s)         Comments: ambulates with a rollator     Hand Dominance        Extremity/Trunk Assessment   Upper Extremity Assessment Upper Extremity Assessment: Generalized weakness    Lower Extremity  Assessment Lower Extremity Assessment: Generalized weakness    Cervical / Trunk Assessment Cervical / Trunk Assessment: Kyphotic  Communication   Communication: No difficulties  Cognition Arousal/Alertness: Awake/alert Behavior During Therapy: WFL for tasks assessed/performed Overall Cognitive Status: Within Functional Limits for tasks assessed                                        General Comments      Exercises     Assessment/Plan    PT Assessment Patient needs continued PT services  PT Problem List Decreased strength;Decreased activity tolerance;Decreased balance;Decreased mobility;Decreased coordination;Decreased knowledge of use of DME;Decreased safety awareness;Decreased knowledge of precautions;Cardiopulmonary status limiting activity       PT Treatment Interventions DME instruction;Gait training;Stair training;Functional mobility training;Therapeutic activities;Therapeutic exercise;Balance training;Neuromuscular re-education;Patient/family education    PT Goals (Current goals can be found in the Care Plan section)  Acute Rehab PT Goals Patient Stated Goal: to improve her strength and endurance PT Goal Formulation: With patient Time For Goal Achievement: 06/24/19 Potential to Achieve Goals: Good    Frequency Min 3X/week   Barriers to discharge        Co-evaluation               AM-PAC PT "6 Clicks" Mobility  Outcome Measure Help needed turning from your back to your side while in a flat bed without using bedrails?: None Help needed moving from lying on your back to sitting on the side of a flat bed without using bedrails?: None Help needed moving to and from a bed to a chair (including a wheelchair)?: A Little Help needed standing up from a chair using your arms (e.g., wheelchair or bedside chair)?: None Help needed to walk in hospital room?: A Little Help needed climbing 3-5 steps with a railing? : A Lot 6 Click Score: 20    End of  Session Equipment Utilized During Treatment: Gait belt Activity Tolerance: Patient limited by fatigue Patient left: in bed;with call bell/phone within reach;Other (comment)(seated EOB) Nurse Communication: Mobility status PT Visit Diagnosis: Other abnormalities of gait and mobility (R26.89);Muscle weakness (generalized) (M62.81)    Time: [id number] PT Time Calculation (min) (ACUTE ONLY): 35 min   Charges:   PT Evaluation $PT Eval Moderate Complexity: 1 Mod PT Treatments $Gait Training: 8-22 mins        Anastasio Champion, DPT  Acute Rehabilitation Services Pager [phone] Office [place] 06/10/2019, 4:24 PM

## 2019-06-10 NOTE — Transfer of Care (Signed)
Immediate Anesthesia Transfer of Care Note  Patient: Patty [name]  Procedure(s) Performed: CARDIOVERSION (N/A )  Patient Location: Endoscopy Unit  Anesthesia Type:General  Level of Consciousness: awake, alert , oriented and drowsy  Airway & Oxygen Therapy: Patient Spontanous Breathing and Patient connected to nasal cannula oxygen  Post-op Assessment: Report given to RN, Post -op Vital signs reviewed and stable and Patient moving all extremities X 4  Post vital signs: Reviewed and stable  Last Vitals:  Vitals Value Taken Time  BP    Temp    Pulse    Resp    SpO2      Last Pain:  Vitals:   06/10/19 0933  TempSrc: Oral  PainSc: 0-No pain      Patients Stated Pain Goal: 0 ([id number])  Complications: No apparent anesthesia complications

## 2019-06-10 NOTE — Progress Notes (Signed)
PROGRESS NOTE    Patty [name]  [id number] DOB: August 17, 1930 DOA: 06/08/2019 PCP: [name], MD   Brief Narrative: Per HPI: 86 y.o. female with medical history significant of chronic atrial fibrillation on warfarin, tachycardia-bradycardia syndrome, pacemaker placement, chronic combined systolic and diastolic heart failure, history of MI, CAD with history of multiple stents, hypertension, renal artery stenosis, diverticulosis, diverticulitis, granulosa cell carcinoma, hyperlipidemia, LBBB, obesity, OSA on CPAP, ovarian cancer who is coming to the emergency department due to nonradiated, central chest pressure for the past 2 days associated with a week history of progressively worse fatigue, dyspnea and lower extremity edema.   In ED: Initial vital signs were temperature 97.7 F, pulse 108, respirations 16, blood pressure 140/74 and O2 sat 95% on room air.  The patient received supplemental oxygen, 25 mg of oral metoprolol and 60 mg of furosemide IVP. LABS: CBC.  PT was 45.0 and INR 5.0. EKG showed wide QRS rhythm, right superior axis deviation.  LBBB, nonspecific intraventricular conduction block. Troponin is 237 and then 244 ng/L.  BNP was 1092.6 pg/mL.  BMP showed normal electrolytes except for calcium of 8.7 mg/dL.  We will need albumin level to see if it corrects to normal.  Glucose 122, BUN 21 and creatinine 1.13 mg/dL.  SARS coronavirus 2 was negative.  Chest radiograph showed cardiomegaly with small bilateral pleural effusions.  Bilateral densities may represent atelectasis or infiltrate. Patient was admitted, seen by cardiology.  Subjective: Seen this morning on her way to cardioversion.  Reports episode of shortness of breath/chest pain this morning.   Assessment & Plan:  Chest pain/CAD-severe/elevated troponin: With underlying three-vessel CAD.  Suspected elevated troponin due to supply demand ischemia in the setting of worsening cardiomyopathy and A. fib with RVR.  No plan for  ischemic evaluation at this time, discontinued diltiazem given degree of cardiomyopathy overall prognosis is poor.   A. fib/a flutter W/ RVR: Noted plan from cardiology with EP evaluation and underwent interrogation degree of under sensing her AF.  Plan for DCCV today and changing to DOAC once INR less than 3. Cont plan as per cardiology.   Acute on Chronic combined diastolic and systolic CHF exacerbation: She has some sign of volume congestion.  Has elevated BNP.  LVEF 40-45% in echo.  Complained of chest pain shortness of breath this morning.  Cardiology on board managing diuretics-increasing IV Lasix.  Essential hypertension: Blood pressure fairly stable.cont metoprolol.   Hyperlipidemia: cont Lipitor   Ovarian cancer- on palliative care  Goals of care: DNR, patient is followed by hospice/palliative care on outpatient basis.  DVT prophylaxis:SCD/INR >2 Code Status:DNR Family Communication: plan of care discussed with patient at bedside. Status is: Inpatient  Remains inpatient appropriate because:Ongoing diagnostic testing needed not appropriate for outpatient work up and Inpatient level of care appropriate due to severity of illness   Dispo: The patient is from: Home              Anticipated d/c is to: Home              Anticipated d/c date is: 2 days              Patient currently is not medically stable to d/c.        Nutrition: Diet Order            Diet Heart Room service appropriate? Yes; Fluid consistency: Thin  Diet effective now  Body mass index is 33.45 kg/m.  Consultants:see note  Procedures:see note Microbiology:see note  Medications: Scheduled Meds: . atorvastatin  80 mg Oral Daily  . dofetilide  125 mcg Oral BID  . furosemide  40 mg Intravenous BID  . gabapentin  300 mg Oral BID  . isosorbide mononitrate  90 mg Oral Daily  . magnesium oxide  800 mg Oral Daily  . metoprolol  200 mg Oral Daily  . potassium chloride SA  20 mEq  Oral BID   Continuous Infusions:  Antimicrobials: Anti-infectives (From admission, onward)   None       Objective: Vitals: Today's Vitals   06/10/19 1007 06/10/19 1017 06/10/19 1027 06/10/19 1140  BP: (!) 103/55 (!) 139/49 (!) 147/58 (!) 120/51  Pulse: 72 68 71 71  Resp: 18 (!) 26 (!) 21 (!) 21  Temp: 97.6 F (36.4 C)   98 F (36.7 C)  TempSrc: Axillary   Oral  SpO2: 98% 98% 98% 96%  Weight:      Height:      PainSc: 0-No pain 0-No pain 0-No pain     Intake/Output Summary (Last 24 hours) at 06/10/2019 1534 Last data filed at 06/10/2019 1004 Gross per 24 hour  Intake 440 ml  Output 300 ml  Net 140 ml   Filed Weights   06/08/19 2356 06/10/19 0434 06/10/19 0933  Weight: 77.1 kg 77.7 kg 77.7 kg   Weight change: 0.589 kg   Intake/Output from previous day: 05/06 0701 - 05/07 0700 In: 290 [P.O.:240; IV Piggyback:50] Out: 900 [Urine:900] Intake/Output this shift: Total I/O In: 200 [I.V.:200] Out: -   Examination:  General exam: AAO, elderly, frail ,NAD, weak appearing. HEENT:Oral mucosa moist, Ear/Nose WNL grossly,dentition normal. Respiratory system: bilaterally basal crackles,no wheezing or crackles,no use of accessory muscle, non tender. Cardiovascular system: S1 & S2 +, irregular, No JVD. Gastrointestinal system: Abdomen soft, NT,ND, BS+. Nervous System:Alert, awake, moving extremities and grossly nonfocal Extremities:  edema, distal peripheral pulses palpable.  Skin: No rashes,no icterus. MSK: Normal muscle bulk,tone, power  Data Reviewed: I have personally reviewed following labs and imaging studies CBC: Recent Labs  Lab 06/08/19 2358  WBC 5.9  HGB 12.8  HCT 41.4  MCV 99.5  PLT [id number]   Basic Metabolic Panel: Recent Labs  Lab 06/08/19 2358 06/09/19 0238 06/09/19 1616 06/10/19 0410  NA 142  --  143 141  K 3.9  --  3.5 3.8  CL 100  --  99 104  CO2 30  --  34* 32  GLUCOSE 122*  --  170* 106*  BUN 21  --  20 21  CREATININE 1.13*  --  1.09*  1.01*  CALCIUM 8.7*  --  8.3* 8.0*  MG  --  2.1  --   --    GFR: Estimated Creatinine Clearance: 36.9 mL/min (A) (by C-G formula based on SCr of 1.01 mg/dL (H)). Liver Function Tests: No results for input(s): AST, ALT, ALKPHOS, BILITOT, PROT, ALBUMIN in the last 168 hours. No results for input(s): LIPASE, AMYLASE in the last 168 hours. No results for input(s): AMMONIA in the last 168 hours. Coagulation Profile: Recent Labs  Lab 06/09/19 0238 06/09/19 0558 06/09/19 1616 06/10/19 0410  INR 5.0* 5.1* 5.4* 3.8*   Cardiac Enzymes: No results for input(s): CKTOTAL, CKMB, CKMBINDEX, TROPONINI in the last 168 hours. BNP (last 3 results) Recent Labs    03/08/19 1610 03/22/19 1502  PROBNP 7,514* 3,420*   HbA1C: No results for input(s): HGBA1C in the  last 72 hours. CBG: No results for input(s): GLUCAP in the last 168 hours. Lipid Profile: No results for input(s): CHOL, HDL, LDLCALC, TRIG, CHOLHDL, LDLDIRECT in the last 72 hours. Thyroid Function Tests: No results for input(s): TSH, T4TOTAL, FREET4, T3FREE, THYROIDAB in the last 72 hours. Anemia Panel: No results for input(s): VITAMINB12, FOLATE, FERRITIN, TIBC, IRON, RETICCTPCT in the last 72 hours. Sepsis Labs: No results for input(s): PROCALCITON, LATICACIDVEN in the last 168 hours.  Recent Results (from the past 240 hour(s))  Respiratory Panel by RT PCR (Flu A&B, Covid) - Nasopharyngeal Swab     Status: None   Collection Time: 06/09/19  2:38 AM   Specimen: Nasopharyngeal Swab  Result Value Ref Range Status   SARS Coronavirus 2 by RT PCR NEGATIVE NEGATIVE Final    Comment: (NOTE) SARS-CoV-2 target nucleic acids are NOT DETECTED. The SARS-CoV-2 RNA is generally detectable in upper respiratoy specimens during the acute phase of infection. The lowest concentration of SARS-CoV-2 viral copies this assay can detect is 131 copies/mL. A negative result does not preclude SARS-Cov-2 infection and should not be used as the sole basis  for treatment or other patient management decisions. A negative result may occur with  improper specimen collection/handling, submission of specimen other than nasopharyngeal swab, presence of viral mutation(s) within the areas targeted by this assay, and inadequate number of viral copies (<131 copies/mL). A negative result must be combined with clinical observations, patient history, and epidemiological information. The expected result is Negative. Fact Sheet for Patients:  PinkCheek.be Fact Sheet for Healthcare Providers:  GravelBags.it This test is not yet ap proved or cleared by the [place] FDA and  has been authorized for detection and/or diagnosis of SARS-CoV-2 by FDA under an Emergency Use Authorization (EUA). This EUA will remain  in effect (meaning this test can be used) for the duration of the COVID-19 declaration under Section 564(b)(1) of the Act, 21 U.S.C. section 360bbb-3(b)(1), unless the authorization is terminated or revoked sooner.    Influenza A by PCR NEGATIVE NEGATIVE Final   Influenza B by PCR NEGATIVE NEGATIVE Final    Comment: (NOTE) The Xpert Xpress SARS-CoV-2/FLU/RSV assay is intended as an aid in  the diagnosis of influenza from Nasopharyngeal swab specimens and  should not be used as a sole basis for treatment. Nasal washings and  aspirates are unacceptable for Xpert Xpress SARS-CoV-2/FLU/RSV  testing. Fact Sheet for Patients: PinkCheek.be Fact Sheet for Healthcare Providers: GravelBags.it This test is not yet approved or cleared by the [place] FDA and  has been authorized for detection and/or diagnosis of SARS-CoV-2 by  FDA under an Emergency Use Authorization (EUA). This EUA will remain  in effect (meaning this test can be used) for the duration of the  Covid-19 declaration under Section 564(b)(1) of the Act, 21  U.S.C.  section 360bbb-3(b)(1), unless the authorization is  terminated or revoked. Performed at [facility], [place] [address] [place], [place] [address]       Radiology Studies: DG Chest 2 View  Result Date: 06/09/2019 CLINICAL DATA:  86 year old female with chest pain EXAM: CHEST - 2 VIEW COMPARISON:  Chest radiograph dated 05/30/2019. FINDINGS: There is cardiomegaly. Small bilateral pleural effusions. Bibasilar densities may represent atelectasis or infiltrate. No pneumothorax. Left pectoral pacemaker device. Right chest wall Port-A-Cath in similar position. No acute osseous pathology. IMPRESSION: Small bilateral pleural effusions and bibasilar atelectasis/infiltrate. Electronically Signed   By: Anner Crete M.D.   On: 06/09/2019 00:37    LOS:  0 days   Time spent: More than 50% of that time was spent in counseling and/or coordination of care. Antonieta Pert, MD [facility]  06/10/2019, 3:34 PM

## 2019-06-10 NOTE — Interval H&P Note (Signed)
History and Physical Interval Note:  06/10/2019 9:19 AM  Patty [name]  has presented today for surgery, with the diagnosis of afib.  The various methods of treatment have been discussed with the patient and family. After consideration of risks, benefits and other options for treatment, the patient has consented to  Procedure(s): CARDIOVERSION (N/A) as a surgical intervention.  The patient's history has been reviewed, patient examined, no change in status, stable for surgery.  I have reviewed the patient's chart and labs.  Questions were answered to the patient's satisfaction.     Dorris Carnes

## 2019-06-10 NOTE — TOC Benefit Eligibility Note (Signed)
Transition of Care [facility]) Benefit Eligibility Note    Patient Details  Name: Patty [name] MRN: [id number] Date of Birth: Sep 30, 1930   Medication/Dose: Arne Cleveland  5 MG BID  Covered?: Yes  Tier: 2 Drug  Prescription Coverage [facility]  and  [facility]  Spoke with Person/Company/Phone Number:: RENA   @ [employer] [id number] # [phone]  Co-Pay: $29.10  Prior Approval: No  Deductible: (NO DEDUCTIBLE WITH PLAN)  Additional Notes: 90 DAY SUPPLY FOR M/O $70.00    Memory Argue Phone Number: 06/10/2019, 1:22 PM

## 2019-06-10 NOTE — Care Management (Signed)
06-10-19 1206 Case Manager received consult for Apixaban 5 mg BID cost. Case Manager submitted benefits check and will follow for cost. Graves-Bigelow, Ocie Cornfield, RN, BSN Case Manager

## 2019-06-10 NOTE — Progress Notes (Signed)
Pt.c/o SOB & slight chest pain 3/10. Vital signs taken.B/P=160/87;HR=113;02 SAT=97 % on 2lnc.Dr.Bhagat was called & made aware ordered metropolol 5 mg iv .Marland Kitchen

## 2019-06-10 NOTE — Progress Notes (Addendum)
[place] for Warfarin  Indication: Afib/flutter  Allergies  Allergen Reactions  . Bee Venom Anaphylaxis  . Other Nausea And Vomiting and Other (See Comments)    Pain medications cause severe vomiting. Tolerated slow IV morphine drip  . Oxycodone Hcl Nausea And Vomiting  . Amiodarone Nausea Only  . Chlorhexidine   . Prednisone Palpitations and Other (See Comments)    "Rapid Heart Beat"    Patient Measurements: Height: 5' (152.4 cm) Weight: 77.7 kg (171 lb 4.8 oz) IBW/kg (Calculated) : 45.5  Vital Signs: Temp: 98.4 F (36.9 C) (05/07 0832) Temp Source: Oral (05/07 0832) BP: 117/83 (05/07 0840) Pulse Rate: 108 (05/07 0840)  Labs: Recent Labs    06/08/19 2358 06/09/19 0238 06/09/19 0238 06/09/19 0558 06/09/19 1616 06/10/19 0410  HGB 12.8  --   --   --   --   --   HCT 41.4  --   --   --   --   --   PLT 309  --   --   --   --   --   LABPROT  --  45.0*   < > 45.7* 48.1* 36.3*  INR  --  5.0*   < > 5.1* 5.4* 3.8*  CREATININE 1.13*  --   --   --  1.09* 1.01*  TROPONINIHS 237* 244*  --   --   --   --    < > = values in this interval not displayed.    Estimated Creatinine Clearance: 36.9 mL/min (A) (by C-G formula based on SCr of 1.01 mg/dL (H)).   Medical History: Past Medical History:  Diagnosis Date  . Bleeding behind the abdominal cavity 10/2017  . Chronic anticoagulation - coumadin, CHADS2VASC=6 05/17/2015  . CKD (chronic kidney disease), stage III   . Combined systolic and diastolic heart failure ([place])   . Coronary artery disease    a. s/p multiple stents - stenting x 2 to the LAD and x 1 to the LCx in 2000, rotational arthrectomy to proximal LCx and DES to LAD in 2014 and DES to LCx in 2014, along with DES to LAD for re-in-stent stenosis in 2015, and DES to ostial LAD in 2016. b. 07/2016 - orbital atherectomy & DES to mid LCx.  . Diverticulitis   . Granulosa cell carcinoma ([place])    abd; last episode was in 2009  .  Hyperlipidemia   . Hypertension   . LBBB (left bundle branch block)   . Myocardial infarction ([place]) 2002  . Obesity   . OSA on CPAP   . Ovarian ca ([place]) 2019  . Pacemaker failure    a. Prior leadless PPM with premature battery failure, being managed conservatively without replacement.  . Peripheral vascular disease ([place])   . Permanent atrial fibrillation ([place]) 2013  . Renal artery stenosis ([place])   . Tachycardia-bradycardia syndrome ([place])    a. s/p leadless pacemaker ([name]) implanted by Dr [name]  . Varicose veins     Assessment: 86 y/o F with shortness of breath, LE edema, and CP - on warfarin PTA for hx Afib. LD 5/5.   INR was 5.1 on admission - now down to 3.8 (still supratherapeutic). CBC stable on last check 5/5. No s/sx of bleeding.   PTA regimen 2 mg daily except 1 mg on MWF.   Goal of Therapy:  INR 2-3 Monitor platelets by anticoagulation protocol: Yes   Plan:  -Continue to hold warfarin  -Daily PT/INR, s/sx  of bleeding >> CM consults placed for apixaban/Xarelto to transition given variable INRs, will plan to transition once Randal Buba, PharmD, [place] Pharmacist  Phone: [phone] 06/10/2019 9:12 AM  Please check AMION for all [place] phone numbers After 10:00 PM, call [place] [phone]

## 2019-06-10 NOTE — Progress Notes (Signed)
Progress Note  Patient Name: Patty [name] Date of Encounter: 06/10/2019  Primary Cardiologist: Larae Grooms, MD   Subjective   Patient doing well post cardioversion.  She did have sinus rhythm restored.  Inpatient Medications    Scheduled Meds: . atorvastatin  80 mg Oral Daily  . dofetilide  125 mcg Oral BID  . furosemide  40 mg Intravenous Daily  . gabapentin  300 mg Oral BID  . isosorbide mononitrate  90 mg Oral Daily  . magnesium oxide  800 mg Oral Daily  . metoprolol  200 mg Oral Daily  . potassium chloride  30 mEq Oral Once  . potassium chloride SA  20 mEq Oral BID   Continuous Infusions:  PRN Meds: acetaminophen **OR** acetaminophen, nitroGLYCERIN, ondansetron (ZOFRAN) IV   Vital Signs    Vitals:   06/10/19 0933 06/10/19 1007 06/10/19 1017 06/10/19 1027  BP: (!) 154/132 (!) 103/55 (!) 139/49 (!) 147/58  Pulse: (!) 101 72 68 71  Resp: (!) 30 18 (!) 26 (!) 21  Temp: 98 F (36.7 C) 97.6 F (36.4 C)    TempSrc: Oral Axillary    SpO2: 100% 98% 98% 98%  Weight: 77.7 kg     Height: 5' (1.524 m)       Intake/Output Summary (Last 24 hours) at 06/10/2019 1114 Last data filed at 06/10/2019 1004 Gross per 24 hour  Intake 440 ml  Output 300 ml  Net 140 ml   Last 3 Weights 06/10/2019 06/10/2019 06/08/2019  Weight (lbs) 171 lb 4.8 oz 171 lb 4.8 oz 170 lb  Weight (kg) 77.7 kg 77.7 kg 77.111 kg      Telemetry    Normal sinus rhythm- Personally Reviewed  ECG    Normal sinus rhythm, left bundle branch block  Physical Exam   GEN:  She does appear somewhat short of breath Neck: No JVD Cardiac: RRR, no murmurs, rubs, or gallops.  Respiratory: Clear to auscultation bilaterally. GI: Soft, nontender, non-distended  MS:  Trace lower extremity edema; No deformity. Neuro:  Nonfocal  Psych: Normal affect   Labs    High Sensitivity Troponin:   Recent Labs  Lab 05/28/19 0228 05/28/19 0453 05/28/19 0650 06/08/19 2358 06/09/19 0238  TROPONINIHS 61* 73* 98*  237* 244*      Chemistry Recent Labs  Lab 06/08/19 2358 06/09/19 1616 06/10/19 0410  NA 142 143 141  K 3.9 3.5 3.8  CL 100 99 104  CO2 30 34* 32  GLUCOSE 122* 170* 106*  BUN 21 20 21   CREATININE 1.13* 1.09* 1.01*  CALCIUM 8.7* 8.3* 8.0*  GFRNONAA 44* 46* 50*  GFRAA 51* 53* 58*  ANIONGAP 12 10 5      Hematology Recent Labs  Lab 06/08/19 2358  WBC 5.9  RBC 4.16  HGB 12.8  HCT 41.4  MCV 99.5  MCH 30.8  MCHC 30.9  RDW 14.6  PLT 309    BNP Recent Labs  Lab 06/09/19 0011  BNP 1,092.6*     DDimer No results for input(s): DDIMER in the last 168 hours.   Radiology    DG Chest 2 View  Result Date: 06/09/2019 CLINICAL DATA:  86 year old female with chest pain EXAM: CHEST - 2 VIEW COMPARISON:  Chest radiograph dated 05/30/2019. FINDINGS: There is cardiomegaly. Small bilateral pleural effusions. Bibasilar densities may represent atelectasis or infiltrate. No pneumothorax. Left pectoral pacemaker device. Right chest wall Port-A-Cath in similar position. No acute osseous pathology. IMPRESSION: Small bilateral pleural effusions and bibasilar atelectasis/infiltrate. Electronically  Signed   By: Anner Crete M.D.   On: 06/09/2019 00:37    Cardiac Studies   2020 cath results reviewed  Patient Profile     86 y.o. female atrial flutter, status post cardioversion  Assessment & Plan    Atrial flutter/atrial fibrillation: She is on antiarrhythmics per electrophysiology.  Currently on Coumadin for anticoagulation.  Per Dr. Jackalyn Lombard note, may want to switch to Xarelto to avoid supratherapeutic INR.  Chronic combined systolic/diastolic heart failure: Increasing Lasix to 40 mg IV twice daily.  Elevated BNP.  She has bilateral pleural effusions as well.  CAD: No angina.  [place] therapy of CAD based on cath in 2020.     For questions or updates, please contact [place] Please consult [email or web address] for contact info under        Signed, Larae Grooms, MD    06/10/2019, 11:14 AM

## 2019-06-10 NOTE — Progress Notes (Addendum)
PT Cancellation Note  Patient Details Name: Patty [name] MRN: [id number] DOB: 1930-08-13   Cancelled Treatment:    Reason Eval/Treat Not Completed: Patient at procedure or test/unavailable. Pt off floor for cardioversion. PT to re-attempt eval as time allows.  1118 addendum: Re-attempted PT eval. Pt pleasantly declining due to just returning to room from procedure. She is currently awaiting her lunch tray as she has been NPO this morning. She is eager to work to therapy. Pt advised, PT to re-attempt as time allows. Pt hopeful for this afternoon but informed it may be tomorrow.   Lorriane Shire 06/10/2019, 9:40 AM  Lorrin Goodell, PT  Office # [phone] Pager [phone]

## 2019-06-11 LAB — BASIC METABOLIC PANEL
Anion gap: 9 (ref 5–15)
BUN: 16 mg/dL (ref 8–23)
CO2: 33 mmol/L — ABNORMAL HIGH (ref 22–32)
Calcium: 8.4 mg/dL — ABNORMAL LOW (ref 8.9–10.3)
Chloride: 103 mmol/L (ref 98–111)
Creatinine, Ser: 0.94 mg/dL (ref 0.44–1.00)
GFR calc Af Amer: >60 mL/min (ref >60)
GFR calc non Af Amer: 55 mL/min — ABNORMAL LOW (ref >60)
Glucose, Bld: 98 mg/dL (ref 70–99)
Potassium: 4.1 mmol/L (ref 3.5–5.1)
Sodium: 145 mmol/L (ref 135–145)

## 2019-06-11 LAB — PROTIME-INR
INR: 2.5 — ABNORMAL HIGH (ref 0.8–1.2)
Prothrombin Time: 25.9 seconds — ABNORMAL HIGH (ref 11.4–15.2)

## 2019-06-11 LAB — MAGNESIUM: Magnesium: 2.2 mg/dL (ref 1.7–2.4)

## 2019-06-11 MED ORDER — RIVAROXABAN 15 MG PO TABS: 15.0000 mg | ORAL_TABLET | Freq: Every day | ORAL | Status: DC

## 2019-06-11 MED ORDER — RIVAROXABAN 20 MG PO TABS
20.0000 mg | ORAL_TABLET | Freq: Every day | ORAL | Status: DC
  Administered 2019-06-11 – 2019-06-14 (×4): 20 mg via ORAL
  Filled 2019-06-11 (×4): qty 1

## 2019-06-11 NOTE — Progress Notes (Signed)
PROGRESS NOTE    Patty [name]  [id number] DOB: 1930/02/17 DOA: 06/08/2019 PCP: [name], MD   Brief Narrative: Per HPI: 86 y.o. female with medical history significant of chronic atrial fibrillation on warfarin, tachycardia-bradycardia syndrome, pacemaker placement, chronic combined systolic and diastolic heart failure, history of MI, CAD with history of multiple stents, hypertension, renal artery stenosis, diverticulosis, diverticulitis, granulosa cell carcinoma, hyperlipidemia, LBBB, obesity, OSA on CPAP, ovarian cancer who is coming to the emergency department due to nonradiated, central chest pressure for the past 2 days associated with a week history of progressively worse fatigue, dyspnea and lower extremity edema.   In ED: Initial vital signs were temperature 97.7 F, pulse 108, respirations 16, blood pressure 140/74 and O2 sat 95% on room air.  The patient received supplemental oxygen, 25 mg of oral metoprolol and 60 mg of furosemide IVP. LABS: CBC.  PT was 45.0 and INR 5.0. EKG showed wide QRS rhythm, right superior axis deviation.  LBBB, nonspecific intraventricular conduction block. Troponin is 237 and then 244 ng/L.  BNP was 1092.6 pg/mL.  BMP showed normal electrolytes except for calcium of 8.7 mg/dL.  We will need albumin level to see if it corrects to normal.  Glucose 122, BUN 21 and creatinine 1.13 mg/dL.  SARS coronavirus 2 was negative.  Chest radiograph showed cardiomegaly with small bilateral pleural effusions.  Bilateral densities may represent atelectasis or infiltrate. Patient was admitted, seen by cardiology. She was continued on IV Lasix, s/p DCCV 5/7  Subjective: Seen and examined this morning.Resting on the bedside chair, on [place] o2 at 2l - not on home o2 but has bedtime cpap. Had some difficulty breathing/orthopneic. Afebrile T-max 98.5. No acute events overnight. Status post cardioversion 5/7   Assessment & Plan:  Chest pain/CAD-severe/elevated troponin:  With underlying three-vessel CAD.  Suspected elevated troponin due to supply demand ischemia in the setting of worsening cardiomyopathy and A. fib with RVR.  No plan for ischemic evaluation at this time, discontinued diltiazem given degree of cardiomyopathy overall prognosis is poor.   A. fib/a flutter W/ RVR: Noted plan from cardiology with EP evaluation and underwent interrogation degree of under sensing her AF. S/P DCCV 5/7 AND changing to DOAC once INR less than 3, per cardiology. On Dofetelide and will be continued.   Acute on Chronic combined diastolic and systolic CHF exacerbation: She has some sign of volume congestion.  Has elevated BNP.  LVEF 40-45% in echo.Cardiology on board managing diuretics-plan is to continue on IV Lasix today AND MONITOR  Essential hypertension: Blood pressure fairly stable.cont metoprolol.   Hyperlipidemia: cont Lipitor.   Ovarian cancer-on palliative care.  Goals of care: DNR, patient is followed by hospice/palliative care on outpatient basis.  DVT prophylaxis:SCD/INR >2 Code Status:DNR Family Communication: plan of care discussed with patient at bedside. Status is: Inpatient  Remains inpatient appropriate because inpatient level of care is needed due to severity of illness and for ongoing IV diuresis for CHF exacerbation  Dispo: The patient is from: Home w/ hospice care              Anticipated d/c is to: Home w/ hospice care              Anticipated d/c date is: 1 day              Patient currently is not medically stable to d/c. Nutrition: Diet Order            Diet Heart Room service appropriate? Yes; Fluid  consistency: Thin  Diet effective now             Body mass index is 33.58 kg/m.  Consultants:see note  Procedures:see note Microbiology:see note  Medications: Scheduled Meds: . atorvastatin  80 mg Oral Daily  . dofetilide  125 mcg Oral BID  . furosemide  40 mg Intravenous BID  . gabapentin  300 mg Oral BID  . isosorbide  mononitrate  90 mg Oral Daily  . magnesium oxide  800 mg Oral Daily  . metoprolol  200 mg Oral Daily  . potassium chloride SA  20 mEq Oral BID  . Rivaroxaban  15 mg Oral Q supper   Continuous Infusions:  Antimicrobials: Anti-infectives (From admission, onward)   None       Objective: Vitals: Today's Vitals   06/10/19 2020 06/11/19 0457 06/11/19 0500 06/11/19 0630  BP: 138/60 (!) 130/43 (!) 147/61   Pulse: 73 72 67   Resp: 20  17   Temp: 98.1 F (36.7 C)  98.5 F (36.9 C)   TempSrc: Oral  Oral   SpO2: 95% 92% 94%   Weight:    78 kg  Height:      PainSc:        Intake/Output Summary (Last 24 hours) at 06/11/2019 0824 Last data filed at 06/11/2019 0630 Gross per 24 hour  Intake 200 ml  Output 1675 ml  Net -1475 ml   Filed Weights   06/10/19 0434 06/10/19 0933 06/11/19 0630  Weight: 77.7 kg 77.7 kg 78 kg   Weight change: 0 kg   Intake/Output from previous day: 05/07 0701 - 05/08 0700 In: 200 [I.V.:200] Out: 1675 [Urine:1675] Intake/Output this shift: No intake/output data recorded.  Examination:  General exam: AAOx3, elderly frail with mildly labored breathing, on nasal cannula. HEENT:Oral mucosa moist, Ear/Nose WNL grossly,dentition normal. Respiratory system: bilaterally diminished breath sound, mild basal crackles,no use of accessory muscle, non tender. Cardiovascular system: S1 & S2 +, irregular, No JVD. Gastrointestinal system: Abdomen soft, NT,ND, BS+. Nervous System:Alert, awake, moving extremities and grossly nonfocal Extremities:  No Edema,distal peripheral pulses palpable.  Skin: No rashes,no icterus. MSK: Normal muscle bulk,tone, power  Data Reviewed: I have personally reviewed following labs and imaging studies CBC: Recent Labs  Lab 06/08/19 2358  WBC 5.9  HGB 12.8  HCT 41.4  MCV 99.5  PLT [id number]   Basic Metabolic Panel: Recent Labs  Lab 06/08/19 2358 06/09/19 0238 06/09/19 1616 06/10/19 0410 06/11/19 0544  NA 142  --  143 141 145   K 3.9  --  3.5 3.8 4.1  CL 100  --  99 104 103  CO2 30  --  34* 32 33*  GLUCOSE 122*  --  170* 106* 98  BUN 21  --  20 21 16   CREATININE 1.13*  --  1.09* 1.01* 0.94  CALCIUM 8.7*  --  8.3* 8.0* 8.4*  MG  --  2.1  --   --  2.2   GFR: Estimated Creatinine Clearance: 39.7 mL/min (by C-G formula based on SCr of 0.94 mg/dL). Liver Function Tests: No results for input(s): AST, ALT, ALKPHOS, BILITOT, PROT, ALBUMIN in the last 168 hours. No results for input(s): LIPASE, AMYLASE in the last 168 hours. No results for input(s): AMMONIA in the last 168 hours. Coagulation Profile: Recent Labs  Lab 06/09/19 0238 06/09/19 0558 06/09/19 1616 06/10/19 0410 06/11/19 0544  INR 5.0* 5.1* 5.4* 3.8* 2.5*   Cardiac Enzymes: No results for input(s): CKTOTAL, CKMB, CKMBINDEX, TROPONINI in  the last 168 hours. BNP (last 3 results) Recent Labs    03/08/19 1610 03/22/19 1502  PROBNP 7,514* 3,420*   HbA1C: No results for input(s): HGBA1C in the last 72 hours. CBG: No results for input(s): GLUCAP in the last 168 hours. Lipid Profile: No results for input(s): CHOL, HDL, LDLCALC, TRIG, CHOLHDL, LDLDIRECT in the last 72 hours. Thyroid Function Tests: No results for input(s): TSH, T4TOTAL, FREET4, T3FREE, THYROIDAB in the last 72 hours. Anemia Panel: No results for input(s): VITAMINB12, FOLATE, FERRITIN, TIBC, IRON, RETICCTPCT in the last 72 hours. Sepsis Labs: No results for input(s): PROCALCITON, LATICACIDVEN in the last 168 hours.  Recent Results (from the past 240 hour(s))  Respiratory Panel by RT PCR (Flu A&B, Covid) - Nasopharyngeal Swab     Status: None   Collection Time: 06/09/19  2:38 AM   Specimen: Nasopharyngeal Swab  Result Value Ref Range Status   SARS Coronavirus 2 by RT PCR NEGATIVE NEGATIVE Final    Comment: (NOTE) SARS-CoV-2 target nucleic acids are NOT DETECTED. The SARS-CoV-2 RNA is generally detectable in upper respiratoy specimens during the acute phase of infection. The  lowest concentration of SARS-CoV-2 viral copies this assay can detect is 131 copies/mL. A negative result does not preclude SARS-Cov-2 infection and should not be used as the sole basis for treatment or other patient management decisions. A negative result may occur with  improper specimen collection/handling, submission of specimen other than nasopharyngeal swab, presence of viral mutation(s) within the areas targeted by this assay, and inadequate number of viral copies (<131 copies/mL). A negative result must be combined with clinical observations, patient history, and epidemiological information. The expected result is Negative. Fact Sheet for Patients:  PinkCheek.be Fact Sheet for Healthcare Providers:  GravelBags.it This test is not yet ap proved or cleared by the [place] FDA and  has been authorized for detection and/or diagnosis of SARS-CoV-2 by FDA under an Emergency Use Authorization (EUA). This EUA will remain  in effect (meaning this test can be used) for the duration of the COVID-19 declaration under Section 564(b)(1) of the Act, 21 U.S.C. section 360bbb-3(b)(1), unless the authorization is terminated or revoked sooner.    Influenza A by PCR NEGATIVE NEGATIVE Final   Influenza B by PCR NEGATIVE NEGATIVE Final    Comment: (NOTE) The Xpert Xpress SARS-CoV-2/FLU/RSV assay is intended as an aid in  the diagnosis of influenza from Nasopharyngeal swab specimens and  should not be used as a sole basis for treatment. Nasal washings and  aspirates are unacceptable for Xpert Xpress SARS-CoV-2/FLU/RSV  testing. Fact Sheet for Patients: PinkCheek.be Fact Sheet for Healthcare Providers: GravelBags.it This test is not yet approved or cleared by the [place] FDA and  has been authorized for detection and/or diagnosis of SARS-CoV-2 by  FDA under an Emergency  Use Authorization (EUA). This EUA will remain  in effect (meaning this test can be used) for the duration of the  Covid-19 declaration under Section 564(b)(1) of the Act, 21  U.S.C. section 360bbb-3(b)(1), unless the authorization is  terminated or revoked. Performed at [facility], [place] [address] [place], [place] [address]       Radiology Studies: No results found.  LOS: 1 day   Time spent: More than 50% of that time was spent in counseling and/or coordination of care. Antonieta Pert, MD [facility]  06/11/2019, 8:24 AM

## 2019-06-11 NOTE — Progress Notes (Addendum)
[place] for warfarin>>xarelto Indication: Afib/flutter  Allergies  Allergen Reactions  . Bee Venom Anaphylaxis  . Other Nausea And Vomiting and Other (See Comments)    Pain medications cause severe vomiting. Tolerated slow IV morphine drip  . Oxycodone Hcl Nausea And Vomiting  . Amiodarone Nausea Only  . Chlorhexidine   . Prednisone Palpitations and Other (See Comments)    "Rapid Heart Beat"    Patient Measurements: Height: 5' (152.4 cm) Weight: 78 kg (171 lb 15.3 oz) IBW/kg (Calculated) : 45.5  Vital Signs: Temp: 98.5 F (36.9 C) (05/08 0500) Temp Source: Oral (05/08 0500) BP: 147/61 (05/08 0500) Pulse Rate: 67 (05/08 0500)  Labs: Recent Labs     0000 06/08/19 2358 06/09/19 0238 06/09/19 0558 06/09/19 1616 06/10/19 0410 06/11/19 0544  HGB  --  12.8  --   --   --   --   --   HCT  --  41.4  --   --   --   --   --   PLT  --  309  --   --   --   --   --   LABPROT  --   --  45.0*   < > 48.1* 36.3* 25.9*  INR  --   --  5.0*   < > 5.4* 3.8* 2.5*  CREATININE   < > 1.13*  --   --  1.09* 1.01* 0.94  TROPONINIHS  --  237* 244*  --   --   --   --    < > = values in this interval not displayed.    Estimated Creatinine Clearance: 39.7 mL/min (by C-G formula based on SCr of 0.94 mg/dL).   Medical History: Past Medical History:  Diagnosis Date  . Bleeding behind the abdominal cavity 10/2017  . Chronic anticoagulation - coumadin, CHADS2VASC=6 05/17/2015  . CKD (chronic kidney disease), stage III   . Combined systolic and diastolic heart failure ([place])   . Coronary artery disease    a. s/p multiple stents - stenting x 2 to the LAD and x 1 to the LCx in 2000, rotational arthrectomy to proximal LCx and DES to LAD in 2014 and DES to LCx in 2014, along with DES to LAD for re-in-stent stenosis in 2015, and DES to ostial LAD in 2016. b. 07/2016 - orbital atherectomy & DES to mid LCx.  . Diverticulitis   . Granulosa cell carcinoma ([place])    abd; last episode was in 2009  . Hyperlipidemia   . Hypertension   . LBBB (left bundle branch block)   . Myocardial infarction ([place]) 2002  . Obesity   . OSA on CPAP   . Ovarian ca ([place]) 2019  . Pacemaker failure    a. Prior leadless PPM with premature battery failure, being managed conservatively without replacement.  . Peripheral vascular disease ([place])   . Permanent atrial fibrillation ([place]) 2013  . Renal artery stenosis ([place])   . Tachycardia-bradycardia syndrome ([place])    a. s/p leadless pacemaker ([name]) implanted by Dr [name]  . Varicose veins     Assessment: 86 y/o F with shortness of breath, LE edema, and CP - on warfarin PTA for hx Afib. She is s/p DCCV 5/7 and to transition to Xarelto -INR = 2.5 -CrCl ~ 50-55 (using total body weight) -Xarelto cost ~ $29  Goal of Therapy:  Monitor platelets by anticoagulation protocol: Yes   Plan:  -Xarelto 20mg  po daily -Will provide patient  education  Hildred Laser, PharmD Clinical Pharmacist **Pharmacist phone directory can now be found on [email or web address] ([email or web address]).  Listed under [place]. 06/11/2019 7:44 AM

## 2019-06-11 NOTE — Progress Notes (Addendum)
Progress Note  Patient Name: Patty [name] Date of Encounter: 06/11/2019  Primary Cardiologist: Larae Grooms, MD   Subjective   Some ongoing SOB, orthopnea this AM  Inpatient Medications    Scheduled Meds: . atorvastatin  80 mg Oral Daily  . dofetilide  125 mcg Oral BID  . furosemide  40 mg Intravenous BID  . gabapentin  300 mg Oral BID  . isosorbide mononitrate  90 mg Oral Daily  . magnesium oxide  800 mg Oral Daily  . metoprolol  200 mg Oral Daily  . potassium chloride SA  20 mEq Oral BID   Continuous Infusions:  PRN Meds: acetaminophen **OR** acetaminophen, nitroGLYCERIN, ondansetron (ZOFRAN) IV   Vital Signs    Vitals:   06/10/19 2020 06/11/19 0457 06/11/19 0500 06/11/19 0630  BP: 138/60 (!) 130/43 (!) 147/61   Pulse: 73 72 67   Resp: 20  17   Temp: 98.1 F (36.7 C)  98.5 F (36.9 C)   TempSrc: Oral  Oral   SpO2: 95% 92% 94%   Weight:    78 kg  Height:        Intake/Output Summary (Last 24 hours) at 06/11/2019 0731 Last data filed at 06/11/2019 0630 Gross per 24 hour  Intake 200 ml  Output 1675 ml  Net -1475 ml   Last 3 Weights 06/11/2019 06/10/2019 06/10/2019  Weight (lbs) 171 lb 15.3 oz 171 lb 4.8 oz 171 lb 4.8 oz  Weight (kg) 78 kg 77.7 kg 77.7 kg      Telemetry    A paced V sensed - Personally Reviewed  ECG    n/a - Personally Reviewed  Physical Exam   GEN: No acute distress.   Neck: No JVD Cardiac: RRR, no murmurs, rubs, or gallops.  Respiratory: mild crackles bilateral bases GI: Soft, nontender, non-distended  MS: No edema; No deformity. Neuro:  Nonfocal  Psych: Normal affect   Labs    High Sensitivity Troponin:   Recent Labs  Lab 05/28/19 0228 05/28/19 0453 05/28/19 0650 06/08/19 2358 06/09/19 0238  TROPONINIHS 61* 73* 98* 237* 244*      Chemistry Recent Labs  Lab 06/09/19 1616 06/10/19 0410 06/11/19 0544  NA 143 141 145  K 3.5 3.8 4.1  CL 99 104 103  CO2 34* 32 33*  GLUCOSE 170* 106* 98  BUN 20 21 16     CREATININE 1.09* 1.01* 0.94  CALCIUM 8.3* 8.0* 8.4*  GFRNONAA 46* 50* 55*  GFRAA 53* 58* >60  ANIONGAP 10 5 9      Hematology Recent Labs  Lab 06/08/19 2358  WBC 5.9  RBC 4.16  HGB 12.8  HCT 41.4  MCV 99.5  MCH 30.8  MCHC 30.9  RDW 14.6  PLT 309    BNP Recent Labs  Lab 06/09/19 0011  BNP 1,092.6*     DDimer No results for input(s): DDIMER in the last 168 hours.   Radiology    No results found.  Cardiac Studies    Patient Profile     86 y.o. female atrial flutter, status post cardioversion  Assessment & Plan    1. Aflutter/afib - on dofetilide per EP - s/p DCCV yesterday - on coumadin for anticoag, consider change to DOAC. INR 2.5 today - EP recommends xarelto once INR <3, will transition today - she is A paced V sensed today  2. Acute on chronic combined systolic/diastolic HF - [id number] echo LVEF 40-45%, indet DDx, (stable from 11/2018 echo) - neg 1.5 L yesterday,  neg 2 L since admission. She is on IV lasix 40mg  bid, downtrend in Cr with diuersis consistent with venous congestion and CHF.   - some ongoing SOB, orthopnea. Continue IV lasix today.    3. CAD - no active issues - has been on long term plavix as outpatient - mild trop elevation in setting of HF, do not plan on ischemic testing at this time. Follow symptoms with control of heart rhythm and her HF.  - prior cath with small vessel disease not amenable to intervention  For questions or updates, please contact [place] Please consult [email or web address] for contact info under        Signed, Carlyle Dolly, MD  06/11/2019, 7:31 AM

## 2019-06-11 NOTE — Discharge Instructions (Signed)

## 2019-06-11 NOTE — Evaluation (Signed)
Occupational Therapy Evaluation Patient Details Name: Patty [name] MRN: [id number] DOB: Aug 11, 1930 Today's Date: 06/11/2019    History of Present Illness Pt is an 86 y/o female with PMH including  chronic atrial fibrillation on warfarin, tachycardia-bradycardia syndrome, pacemaker placement, chronic combined systolic and diastolic heart failure, history of MI, CAD with history of multiple stents, hypertension, renal artery stenosis, diverticulosis, diverticulitis, granulosa cell carcinoma, hyperlipidemia, LBBB, obesity, OSA on CPAP, ovarian cancer who is coming to the emergency department due to nonradiated, central chest pressure with worsening SOB and fatigue. Suspected elevated troponin due to supply demand ischemia in the setting of worsening cardiomyopathy and A. fib with RVR. Pt now s/p cardioversion.   Clinical Impression   This 86 yo female admitted with above presents to acute OT with PLOF of mod I to independent with basic ADLs, some IADLs, driving, and doing her own grocery shopping. Currently pt is needing O2 at 2 liters to maintain her sats above 92% (on RA with me up moving about in room and ambulating in hallway pt's O2 dropped to 86% on RA. I am concerned (voiced this to patient) that if she ends up going home on O2 she will have issues with managing O2 tubing with RW and if they only send her home with large portable tanks there is no way she can manage these and her RW to go out of the home with--sent a chat text to weekend [facility] Wendi as well. Pt will continue to benefit from acute OT with follow up [place].     Follow Up Recommendations  Home health OT;Supervision - Intermittent    Equipment Recommendations  None recommended by OT       Precautions / Restrictions Precautions Precautions: Fall Precaution Comments: watch SpO2 (dropped to 83% on RA with PT and 86% with OT on RA) Restrictions Weight Bearing Restrictions: No      Mobility Bed Mobility                General bed mobility comments: Pt up in recliner upon arrival  Transfers Overall transfer level: Needs assistance Equipment used: Rolling walker (2 wheeled) Transfers: Sit to/from Stand Sit to Stand: Min guard         General transfer comment: VCs for safe hand placement    Balance Overall balance assessment: Needs assistance Sitting-balance support: No upper extremity supported;Feet supported Sitting balance-Leahy Scale: Good     Standing balance support: No upper extremity supported;During functional activity Standing balance-Leahy Scale: Fair                             ADL either performed or assessed with clinical judgement   ADL Overall ADL's : Needs assistance/impaired Eating/Feeding: Independent;Sitting   Grooming: Min guard;Standing   Upper Body Bathing: Set up;Sitting   Lower Body Bathing: Min guard;Sit to/from stand   Upper Body Dressing : Set up;Sitting   Lower Body Dressing: Min guard;Sit to/from stand   Toilet Transfer: Min guard;Ambulation;RW;BSC(over toilet)   Toileting- Clothing Manipulation and Hygiene: Min guard;Sit to/from stand         General ADL Comments: Pt reports she has been sponge bathing for fear of falling in tub/shower even though she has grab bars. Pt drives to store, uses electric cart in stores, has problem solved many ways to make life easier for herself (ie: pull cart to bring in groceries, asking bagger at grocery store to not put some many itmes in each bag  and to please load her car). The issue she is having now is getting her distilled water into her bipap machine due to full gallon jugs--"but I manage"     Vision Patient Visual Report: No change from baseline              Pertinent Vitals/Pain Pain Assessment: No/denies pain     Hand Dominance Right   Extremity/Trunk Assessment Upper Extremity Assessment Upper Extremity Assessment: Overall WFL for tasks assessed           Communication  Communication Communication: No difficulties   Cognition Arousal/Alertness: Awake/alert Behavior During Therapy: WFL for tasks assessed/performed Overall Cognitive Status: Within Functional Limits for tasks assessed                                                Home Living Family/patient expects to be discharged to:: Private residence Living Arrangements: Alone Available Help at Discharge: Family;Available PRN/intermittently Type of Home: House Home Access: Level entry     Home Layout: One level     Bathroom Shower/Tub: Teacher, early years/pre: Standard     Home Equipment: Walker - 4 wheels;Grab bars - tub/shower;Grab bars - toilet;Hand held shower head          Prior Functioning/Environment Level of Independence: Independent with assistive device(s)        Comments: ambulates with a rollator        OT Problem List: Decreased strength;Impaired balance (sitting and/or standing);Cardiopulmonary status limiting activity      OT Treatment/Interventions: Self-care/ADL training;DME and/or AE instruction;Patient/family education;Balance training;Energy conservation    OT Goals(Current goals can be found in the care plan section) Acute Rehab OT Goals Patient Stated Goal: to be able to stay by herself for as long as possible OT Goal Formulation: With patient Time For Goal Achievement: 06/25/19 Potential to Achieve Goals: Good  OT Frequency: Min 2X/week   Barriers to D/C: Decreased caregiver support             AM-PAC OT "6 Clicks" Daily Activity     Outcome Measure Help from another person eating meals?: None Help from another person taking care of personal grooming?: A Little Help from another person toileting, which includes using toliet, bedpan, or urinal?: A Little Help from another person bathing (including washing, rinsing, drying)?: A Little Help from another person to put on and taking off regular upper body clothing?: A  Little Help from another person to put on and taking off regular lower body clothing?: A Little 6 Click Score: 19   End of Session Equipment Utilized During Treatment: Gait belt;Rolling walker;Oxygen(RA-2 liters)  Activity Tolerance: Patient tolerated treatment well Patient left: in chair;with call bell/phone within reach  OT Visit Diagnosis: Unsteadiness on feet (R26.81);Other abnormalities of gait and mobility (R26.89);Muscle weakness (generalized) (M62.81)                Time: [id number] OT Time Calculation (min): 48 min Charges:  OT General Charges $OT Visit: 1 Visit OT Evaluation $OT Eval Moderate Complexity: 1 Mod OT Treatments $Self Care/Home Management : 23-37 mins  [address], OTR/L Acute [employer] Pager [phone] Office [phone]     Almon Register 06/11/2019, 3:33 PM

## 2019-06-12 LAB — BASIC METABOLIC PANEL
Anion gap: 8 (ref 5–15)
BUN: 17 mg/dL (ref 8–23)
CO2: 38 mmol/L — ABNORMAL HIGH (ref 22–32)
Calcium: 8.6 mg/dL — ABNORMAL LOW (ref 8.9–10.3)
Chloride: 100 mmol/L (ref 98–111)
Creatinine, Ser: 0.98 mg/dL (ref 0.44–1.00)
GFR calc Af Amer: >60 mL/min (ref >60)
GFR calc non Af Amer: 52 mL/min — ABNORMAL LOW (ref >60)
Glucose, Bld: 105 mg/dL — ABNORMAL HIGH (ref 70–99)
Potassium: 4.4 mmol/L (ref 3.5–5.1)
Sodium: 146 mmol/L — ABNORMAL HIGH (ref 135–145)

## 2019-06-12 LAB — MAGNESIUM: Magnesium: 2.1 mg/dL (ref 1.7–2.4)

## 2019-06-12 MED ORDER — FUROSEMIDE 10 MG/ML IJ SOLN
60.0000 mg | Freq: Two times a day (BID) | INTRAMUSCULAR | Status: DC
  Administered 2019-06-12 – 2019-06-13 (×3): 60 mg via INTRAVENOUS
  Filled 2019-06-12 (×3): qty 6

## 2019-06-12 NOTE — Progress Notes (Signed)
Progress Note  Patient Name: Jamie-Lee Mccreless Heuer Date of Encounter: 06/12/2019  Primary Cardiologist: Larae Grooms, MD   Subjective   Some ongoing SOB  Inpatient Medications    Scheduled Meds: . atorvastatin  80 mg Oral Daily  . dofetilide  125 mcg Oral BID  . furosemide  60 mg Intravenous BID  . gabapentin  300 mg Oral BID  . isosorbide mononitrate  90 mg Oral Daily  . magnesium oxide  800 mg Oral Daily  . metoprolol  200 mg Oral Daily  . potassium chloride SA  20 mEq Oral BID  . rivaroxaban  20 mg Oral Q supper   Continuous Infusions:  PRN Meds: acetaminophen **OR** acetaminophen, nitroGLYCERIN, ondansetron (ZOFRAN) IV   Vital Signs    Vitals:   06/11/19 1511 06/11/19 2022 06/12/19 0021 06/12/19 0609  BP: 140/62 (!) 148/57 (!) 127/59 (!) 152/50  Pulse: 70 67 67 81  Resp:  17 16 20   Temp: 98.3 F (36.8 C) 98.2 F (36.8 C) 98.7 F (37.1 C) 98.1 F (36.7 C)  TempSrc: Oral Oral Axillary Axillary  SpO2: 93% 95% 96% 96%  Weight:    80.1 kg  Height:        Intake/Output Summary (Last 24 hours) at 06/12/2019 0802 Last data filed at 06/12/2019 0700 Gross per 24 hour  Intake --  Output 650 ml  Net -650 ml   Last 3 Weights 06/12/2019 06/11/2019 06/10/2019  Weight (lbs) 176 lb 9.6 oz 171 lb 15.3 oz 171 lb 4.8 oz  Weight (kg) 80.105 kg 78 kg 77.7 kg      Telemetry    A paced V sensed - Personally Reviewed  ECG    n/a - Personally Reviewed  Physical Exam   GEN: No acute distress.   Neck: No JVD Cardiac: RRR, no murmurs, rubs, or gallops.  Respiratory: faint crackles bilaterally GI: Soft, nontender, non-distended  MS: No edema; No deformity. Neuro:  Nonfocal  Psych: Normal affect   Labs    High Sensitivity Troponin:   Recent Labs  Lab 05/28/19 0228 05/28/19 0453 05/28/19 0650 06/08/19 2358 06/09/19 0238  TROPONINIHS 61* 73* 98* 237* 244*      Chemistry Recent Labs  Lab 06/10/19 0410 06/11/19 0544 06/12/19 0338  NA 141 145 146*  K 3.8 4.1  4.4  CL 104 103 100  CO2 32 33* 38*  GLUCOSE 106* 98 105*  BUN 21 16 17   CREATININE 1.01* 0.94 0.98  CALCIUM 8.0* 8.4* 8.6*  GFRNONAA 50* 55* 52*  GFRAA 58* >60 >60  ANIONGAP 5 9 8      Hematology Recent Labs  Lab 06/08/19 2358  WBC 5.9  RBC 4.16  HGB 12.8  HCT 41.4  MCV 99.5  MCH 30.8  MCHC 30.9  RDW 14.6  PLT 309    BNP Recent Labs  Lab 06/09/19 0011  BNP 1,092.6*     DDimer No results for input(s): DDIMER in the last 168 hours.   Radiology    No results found.  Cardiac Studies     Patient Profile     86 y.o.femaleatrial flutter, status post cardioversion also being managed for acute on chronic combined systolic/diastolic HF   Assessment & Plan    1. Aflutter/afib - on dofetilide per EP - s/p DCCV this admission - supratherapeutic INR this admissin, changed from coumadin to xarelto this admit - tele this AM A paced V sensed   2. Acute on chronic combined systolic/diastolic HF - [id number] echo LVEF  40-45%, indet DDx, (stable from 11/2018 echo) - I/Os incomplete yesterday. She is on IV lasix 40mg  bid. Stable renal function but some increase in Na and bicarb.. Remains symptomatic on supplemental O2 - increase lasix to 60mg  bid today  3. CAD - no active issues - has been on long term plavix as outpatient - mild trop elevation in setting of HF, do not plan on ischemic testing at this time. Follow symptoms with control of heart rhythm and her HF.  - prior cath with small vessel disease not amenable to intervention  For questions or updates, please contact [place] Please consult [email or web address] for contact info under        Signed, Carlyle Dolly, MD  06/12/2019, 8:02 AM

## 2019-06-12 NOTE — Progress Notes (Signed)
PROGRESS NOTE    Patty [name]  [id number] DOB: 1930/07/03 DOA: 06/08/2019 PCP: [name], MD   Brief Narrative: Per HPI: 86 y.o. female with medical history significant of chronic atrial fibrillation on warfarin, tachycardia-bradycardia syndrome, pacemaker placement, chronic combined systolic and diastolic heart failure, history of MI, CAD with history of multiple stents, hypertension, renal artery stenosis, diverticulosis, diverticulitis, granulosa cell carcinoma, hyperlipidemia, LBBB, obesity, OSA on CPAP, ovarian cancer who is coming to the emergency department due to nonradiated, central chest pressure for the past 2 days associated with a week history of progressively worse fatigue, dyspnea and lower extremity edema.   In ED ,CBC.  PT was 45.0 and INR 5.0. EKG showed wide QRS rhythm, right superior axis deviation.  LBBB, nonspecific intraventricular conduction block. Troponin is 237 and then 244 ng/L.  BNP was 1092.6 pg/mL.  BMP showed normal electrolytes except for calcium of 8.7 mg/dL.SARS coronavirus 2 was negative.  Chest radiograph showed cardiomegaly with small bilateral pleural effusions.  Bilateral densities may represent atelectasis or infiltrate. Patient was admitted for acute on chronic combined systolic diastolic CHF exacerbation treated with IV Lasix. She was continued on IV Lasix, s/p DCCV 5/7 5/9-Lasix increased to 60 mg twice daily for ongoing shortness of breath  Subjective: on [place] o2 at 2l - not on home o2 but has bedtime cpap. Complains of ongoing shortness of breath Could not sleep after the blood draw and trying to take a nap this morning.    Assessment & Plan:  Acute on Chronic combined diastolic and systolic CHF exacerbation: with signs of volume congestion,elevated BNP.  LVEF 40-45% in echo.Cardiology on board managing diuretics-due to ongoing shortness of breath increasing Lasix to 60 mg twice daily.  Continue to monitor intake output Daily weight.  Seems  to be up, renal function stable.   Chest pain/CAD-severe/elevated troponin: With underlying three-vessel CAD.  Suspected elevated troponin due to supply demand ischemia in the setting of worsening cardiomyopathy and A. fib with RVR.  No plan for ischemic evaluation at this time, discontinued diltiazem given degree of cardiomyopathy overall prognosis is poor.   A. fib/a flutter W/ RVR: Noted plan from cardiology with EP evaluation and underwent interrogation degree of under sensing her AF. S/P DCCV 5/7 AND changed to DOAC (INR less than 3) Cont her Dofetelide.  Essential hypertension: Blood pressure fairly stable.cont metoprolol.   Hyperlipidemia: cont Lipitor.   Ovarian cancer-on palliative care.  Goals of care: DNR, patient is followed by hospice/palliative care on outpatient basis.  DVT prophylaxis:xarelto Code Status:DNR Family Communication: plan of care discussed with patient at bedside. Status is: Inpatient  Remains inpatient appropriate because inpatient level of care is needed due to severity of illness and for ongoing IV diuresis for CHF exacerbation  Dispo: The patient is from: Home w/ hospice care              Anticipated d/c is to: Home w/ hospice care              Anticipated d/c date is: 1 day              Patient currently is not medically stable to d/c. Nutrition: Diet Order            Diet Heart Room service appropriate? Yes; Fluid consistency: Thin  Diet effective now             Body mass index is 34.49 kg/m.  Consultants:see note  Procedures:see note Microbiology:see note  Medications: Scheduled Meds: .  atorvastatin  80 mg Oral Daily  . dofetilide  125 mcg Oral BID  . furosemide  60 mg Intravenous BID  . gabapentin  300 mg Oral BID  . isosorbide mononitrate  90 mg Oral Daily  . magnesium oxide  800 mg Oral Daily  . metoprolol  200 mg Oral Daily  . potassium chloride SA  20 mEq Oral BID  . rivaroxaban  20 mg Oral Q supper   Continuous  Infusions:  Antimicrobials: Anti-infectives (From admission, onward)   None       Objective: Vitals: Today's Vitals   06/11/19 2022 06/11/19 2040 06/12/19 0021 06/12/19 0609  BP: (!) 148/57  (!) 127/59 (!) 152/50  Pulse: 67  67 81  Resp: 17  16 20   Temp: 98.2 F (36.8 C)  98.7 F (37.1 C) 98.1 F (36.7 C)  TempSrc: Oral  Axillary Axillary  SpO2: 95%  96% 96%  Weight:    80.1 kg  Height:      PainSc:  0-No pain      Intake/Output Summary (Last 24 hours) at 06/12/2019 0815 Last data filed at 06/12/2019 0700 Gross per 24 hour  Intake --  Output 650 ml  Net -650 ml   Filed Weights   06/10/19 0933 06/11/19 0630 06/12/19 0609  Weight: 77.7 kg 78 kg 80.1 kg   Weight change: 2.405 kg   Intake/Output from previous day: 05/08 0701 - 05/09 0700 In: -  Out: 650 [Urine:650] Intake/Output this shift: No intake/output data recorded.  Examination:  General exam: AAOx3, elderly frail,on nasal cannula. HEENT:Oral mucosa moist, Ear/Nose WNL grossly,dentition normal. Respiratory system: bilaterally diminished breath sound, mild basal crackles,no use of accessory muscle, non tender. Cardiovascular system: S1 & S2 +, irregular, No JVD. Gastrointestinal system: Abdomen soft, NT,ND, BS+. Nervous System:Alert, awake, moving extremities and grossly nonfocal Extremities:  No Edema,distal peripheral pulses palpable.  Skin: No rashes,no icterus. MSK: Normal muscle bulk,tone, power  Data Reviewed: I have personally reviewed following labs and imaging studies CBC: Recent Labs  Lab 06/08/19 2358  WBC 5.9  HGB 12.8  HCT 41.4  MCV 99.5  PLT [id number]   Basic Metabolic Panel: Recent Labs  Lab 06/08/19 2358 06/09/19 0238 06/09/19 1616 06/10/19 0410 06/11/19 0544 06/12/19 0338  NA 142  --  143 141 145 146*  K 3.9  --  3.5 3.8 4.1 4.4  CL 100  --  99 104 103 100  CO2 30  --  34* 32 33* 38*  GLUCOSE 122*  --  170* 106* 98 105*  BUN 21  --  20 21 16 17   CREATININE 1.13*  --  1.09*  1.01* 0.94 0.98  CALCIUM 8.7*  --  8.3* 8.0* 8.4* 8.6*  MG  --  2.1  --   --  2.2 2.1   GFR: Estimated Creatinine Clearance: 38.6 mL/min (by C-G formula based on SCr of 0.98 mg/dL). Liver Function Tests: No results for input(s): AST, ALT, ALKPHOS, BILITOT, PROT, ALBUMIN in the last 168 hours. No results for input(s): LIPASE, AMYLASE in the last 168 hours. No results for input(s): AMMONIA in the last 168 hours. Coagulation Profile: Recent Labs  Lab 06/09/19 0238 06/09/19 0558 06/09/19 1616 06/10/19 0410 06/11/19 0544  INR 5.0* 5.1* 5.4* 3.8* 2.5*   Cardiac Enzymes: No results for input(s): CKTOTAL, CKMB, CKMBINDEX, TROPONINI in the last 168 hours. BNP (last 3 results) Recent Labs    03/08/19 1610 03/22/19 1502  PROBNP 7,514* 3,420*   HbA1C: No results for  input(s): HGBA1C in the last 72 hours. CBG: No results for input(s): GLUCAP in the last 168 hours. Lipid Profile: No results for input(s): CHOL, HDL, LDLCALC, TRIG, CHOLHDL, LDLDIRECT in the last 72 hours. Thyroid Function Tests: No results for input(s): TSH, T4TOTAL, FREET4, T3FREE, THYROIDAB in the last 72 hours. Anemia Panel: No results for input(s): VITAMINB12, FOLATE, FERRITIN, TIBC, IRON, RETICCTPCT in the last 72 hours. Sepsis Labs: No results for input(s): PROCALCITON, LATICACIDVEN in the last 168 hours.  Recent Results (from the past 240 hour(s))  Respiratory Panel by RT PCR (Flu A&B, Covid) - Nasopharyngeal Swab     Status: None   Collection Time: 06/09/19  2:38 AM   Specimen: Nasopharyngeal Swab  Result Value Ref Range Status   SARS Coronavirus 2 by RT PCR NEGATIVE NEGATIVE Final    Comment: (NOTE) SARS-CoV-2 target nucleic acids are NOT DETECTED. The SARS-CoV-2 RNA is generally detectable in upper respiratoy specimens during the acute phase of infection. The lowest concentration of SARS-CoV-2 viral copies this assay can detect is 131 copies/mL. A negative result does not preclude SARS-Cov-2 infection  and should not be used as the sole basis for treatment or other patient management decisions. A negative result may occur with  improper specimen collection/handling, submission of specimen other than nasopharyngeal swab, presence of viral mutation(s) within the areas targeted by this assay, and inadequate number of viral copies (<131 copies/mL). A negative result must be combined with clinical observations, patient history, and epidemiological information. The expected result is Negative. Fact Sheet for Patients:  PinkCheek.be Fact Sheet for Healthcare Providers:  GravelBags.it This test is not yet ap proved or cleared by the [place] FDA and  has been authorized for detection and/or diagnosis of SARS-CoV-2 by FDA under an Emergency Use Authorization (EUA). This EUA will remain  in effect (meaning this test can be used) for the duration of the COVID-19 declaration under Section 564(b)(1) of the Act, 21 U.S.C. section 360bbb-3(b)(1), unless the authorization is terminated or revoked sooner.    Influenza A by PCR NEGATIVE NEGATIVE Final   Influenza B by PCR NEGATIVE NEGATIVE Final    Comment: (NOTE) The Xpert Xpress SARS-CoV-2/FLU/RSV assay is intended as an aid in  the diagnosis of influenza from Nasopharyngeal swab specimens and  should not be used as a sole basis for treatment. Nasal washings and  aspirates are unacceptable for Xpert Xpress SARS-CoV-2/FLU/RSV  testing. Fact Sheet for Patients: PinkCheek.be Fact Sheet for Healthcare Providers: GravelBags.it This test is not yet approved or cleared by the [place] FDA and  has been authorized for detection and/or diagnosis of SARS-CoV-2 by  FDA under an Emergency Use Authorization (EUA). This EUA will remain  in effect (meaning this test can be used) for the duration of the  Covid-19 declaration under Section  564(b)(1) of the Act, 21  U.S.C. section 360bbb-3(b)(1), unless the authorization is  terminated or revoked. Performed at [facility], [place] [address], [place], [place] [address]       Radiology Studies: No results found.  LOS: 2 days   Time spent: More than 50% of that time was spent in counseling and/or coordination of care. Antonieta Pert, MD [facility]  06/12/2019, 8:15 AM

## 2019-06-13 ENCOUNTER — Encounter (HOSPITAL_COMMUNITY): Payer: Self-pay | Admitting: Internal Medicine

## 2019-06-13 DIAGNOSIS — I2510 Atherosclerotic heart disease of native coronary artery without angina pectoris: Secondary | ICD-10-CM

## 2019-06-13 DIAGNOSIS — I10 Essential (primary) hypertension: Secondary | ICD-10-CM

## 2019-06-13 LAB — BASIC METABOLIC PANEL
Anion gap: 5 (ref 5–15)
Anion gap: 5 (ref 5–15)
BUN: 17 mg/dL (ref 8–23)
BUN: 17 mg/dL (ref 8–23)
CO2: 40 mmol/L — ABNORMAL HIGH (ref 22–32)
CO2: 41 mmol/L — ABNORMAL HIGH (ref 22–32)
Calcium: 8.5 mg/dL — ABNORMAL LOW (ref 8.9–10.3)
Calcium: 8.6 mg/dL — ABNORMAL LOW (ref 8.9–10.3)
Chloride: 100 mmol/L (ref 98–111)
Chloride: 99 mmol/L (ref 98–111)
Creatinine, Ser: 0.9 mg/dL (ref 0.44–1.00)
Creatinine, Ser: 0.94 mg/dL (ref 0.44–1.00)
GFR calc Af Amer: >60 mL/min (ref >60)
GFR calc Af Amer: >60 mL/min (ref >60)
GFR calc non Af Amer: 58 mL/min — ABNORMAL LOW (ref >60)
GFR calc non Af Amer: 55 mL/min — ABNORMAL LOW (ref >60)
Glucose, Bld: 100 mg/dL — ABNORMAL HIGH (ref 70–99)
Glucose, Bld: 102 mg/dL — ABNORMAL HIGH (ref 70–99)
Potassium: 3.5 mmol/L (ref 3.5–5.1)
Potassium: 3.7 mmol/L (ref 3.5–5.1)
Sodium: 145 mmol/L (ref 135–145)
Sodium: 145 mmol/L (ref 135–145)

## 2019-06-13 LAB — PROTIME-INR
INR: 3 — ABNORMAL HIGH (ref 0.8–1.2)
Prothrombin Time: 29.9 seconds — ABNORMAL HIGH (ref 11.4–15.2)

## 2019-06-13 LAB — MAGNESIUM: Magnesium: 2 mg/dL (ref 1.7–2.4)

## 2019-06-13 MED ORDER — POTASSIUM CHLORIDE CRYS ER 20 MEQ PO TBCR
40.0000 meq | EXTENDED_RELEASE_TABLET | Freq: Two times a day (BID) | ORAL | Status: DC
  Administered 2019-06-13 – 2019-06-15 (×4): 40 meq via ORAL
  Filled 2019-06-13 (×4): qty 2

## 2019-06-13 MED ORDER — POTASSIUM CHLORIDE CRYS ER 20 MEQ PO TBCR
20.0000 meq | EXTENDED_RELEASE_TABLET | Freq: Once | ORAL | Status: AC
  Administered 2019-06-13: 20 meq via ORAL
  Filled 2019-06-13: qty 1

## 2019-06-13 MED ORDER — FUROSEMIDE 80 MG PO TABS
80.0000 mg | ORAL_TABLET | Freq: Two times a day (BID) | ORAL | Status: DC
  Administered 2019-06-13 – 2019-06-15 (×4): 80 mg via ORAL
  Filled 2019-06-13 (×4): qty 1

## 2019-06-13 NOTE — Progress Notes (Signed)
Physical Therapy Treatment Patient Details Name: Patty [name] MRN: [id number] DOB: [birth year] Today's Date: 06/13/2019    History of Present Illness Pt is an 86 y/o female with PMH including  chronic atrial fibrillation on warfarin, tachycardia-bradycardia syndrome, pacemaker placement, chronic combined systolic and diastolic heart failure, history of MI, CAD with history of multiple stents, hypertension, renal artery stenosis, diverticulosis, diverticulitis, granulosa cell carcinoma, hyperlipidemia, LBBB, obesity, OSA on CPAP, ovarian cancer who is coming to the emergency department due to nonradiated, central chest pressure with worsening SOB and fatigue. Suspected elevated troponin due to supply demand ischemia in the setting of worsening cardiomyopathy and A. fib with RVR. Pt now s/p cardioversion.    PT Comments    Pt admitted with above diagnosis. Pt was able to ambulate in room with HR from 80-114 bpm and sats 89% on RA with activity. O2 >90% on RA at rest.  Will continue to follow acutely.  Pt currently with functional limitations due to balance and endurance deficits. Pt will benefit from skilled PT to increase their independence and safety with mobility to allow discharge to the venue listed below.     Follow Up Recommendations  Home health PT     Equipment Recommendations  3in1 (PT)    Recommendations for Other Services       Precautions / Restrictions Precautions Precautions: Fall Precaution Comments: watch SpO2  Restrictions Weight Bearing Restrictions: No    Mobility  Bed Mobility Overal bed mobility: Modified Independent                Transfers Overall transfer level: Needs assistance Equipment used: Rolling walker (2 wheeled) Transfers: Sit to/from Stand Sit to Stand: Supervision         General transfer comment: for safety   Ambulation/Gait Ambulation/Gait assistance: Min guard Gait Distance (Feet): 60 Feet Assistive device: Rolling walker (2  wheeled) Gait Pattern/deviations: Step-through pattern;Decreased stride length Gait velocity: decreased   General Gait Details: pt with steady, cautious gait with use of RW; limited in distance secondary to fatigue   Stairs             Wheelchair Mobility    Modified Rankin (Stroke Patients Only)       Balance Overall balance assessment: Needs assistance Sitting-balance support: No upper extremity supported;Feet supported Sitting balance-Leahy Scale: Good     Standing balance support: No upper extremity supported;Bilateral upper extremity supported;During functional activity Standing balance-Leahy Scale: Fair Standing balance comment: BUE support dynamically, but supervision statically during grooming                             Cognition Arousal/Alertness: Awake/alert Behavior During Therapy: WFL for tasks assessed/performed Overall Cognitive Status: Within Functional Limits for tasks assessed                                        Exercises General Exercises - Lower Extremity Ankle Circles/Pumps: AROM;Both;10 reps;Supine Long Arc Quad: AROM;Both;10 reps;Seated Hip Flexion/Marching: AROM;Both;10 reps;Seated    General Comments General comments (skin integrity, edema, etc.): On RA during sesson and able to tolerate well with O2 89% and > with activity.  90% and > at rest.  HR 80-114 bpm.       Pertinent Vitals/Pain Pain Assessment: No/denies pain    Home Living  Prior Function            PT Goals (current goals can now be found in the care plan section) Acute Rehab PT Goals Patient Stated Goal: to be able to stay by herself for as long as possible Progress towards PT goals: Progressing toward goals    Frequency    Min 3X/week      PT Plan Current plan remains appropriate    Co-evaluation              AM-PAC PT "6 Clicks" Mobility   Outcome Measure  Help needed turning from your  back to your side while in a flat bed without using bedrails?: None Help needed moving from lying on your back to sitting on the side of a flat bed without using bedrails?: None Help needed moving to and from a bed to a chair (including a wheelchair)?: None Help needed standing up from a chair using your arms (e.g., wheelchair or bedside chair)?: None Help needed to walk in hospital room?: A Little Help needed climbing 3-5 steps with a railing? : A Lot 6 Click Score: 21    End of Session Equipment Utilized During Treatment: Gait belt Activity Tolerance: Patient limited by fatigue Patient left: with call bell/phone within reach;in chair;with chair alarm set Nurse Communication: Mobility status PT Visit Diagnosis: Other abnormalities of gait and mobility (R26.89);Muscle weakness (generalized) (M62.81)     Time: [id number] PT Time Calculation (min) (ACUTE ONLY): 23 min  Charges:  $Gait Training: 8-22 mins $Therapeutic Exercise: 8-22 mins                     Tawan Corkern W,PT Acute Rehabilitation Services Pager:  [phone]  Office:  [place] 06/13/2019, 4:02 PM

## 2019-06-13 NOTE — Progress Notes (Signed)
Occupational Therapy Treatment Patient Details Name: Patty [name] MRN: [id number] DOB: [birth year] Today's Date: 06/13/2019    History of present illness Pt is an 86 y/o female with PMH including  chronic atrial fibrillation on warfarin, tachycardia-bradycardia syndrome, pacemaker placement, chronic combined systolic and diastolic heart failure, history of MI, CAD with history of multiple stents, hypertension, renal artery stenosis, diverticulosis, diverticulitis, granulosa cell carcinoma, hyperlipidemia, LBBB, obesity, OSA on CPAP, ovarian cancer who is coming to the emergency department due to nonradiated, central chest pressure with worsening SOB and fatigue. Suspected elevated troponin due to supply demand ischemia in the setting of worsening cardiomyopathy and A. fib with RVR. Pt now s/p cardioversion.   OT comments  Patient supine in bed upon entry, eager to engage in OT session.  Completed mobility and ADLS in room using RW with supervision (including LB dressing, grooming at sink, and toileting).  She was on 2L upon entry, placed on RA and SpO2 maintained >90% throughout session.  Patient progressing well towards OT goals.  Continue per POC with HHOT at dc.    Follow Up Recommendations  Home health OT;Supervision - Intermittent    Equipment Recommendations  None recommended by OT    Recommendations for Other Services      Precautions / Restrictions Precautions Precautions: Fall Precaution Comments: watch SpO2  Restrictions Weight Bearing Restrictions: No       Mobility Bed Mobility Overal bed mobility: Modified Independent                Transfers Overall transfer level: Needs assistance Equipment used: Rolling walker (2 wheeled) Transfers: Sit to/from Stand Sit to Stand: Supervision         General transfer comment: for safety     Balance Overall balance assessment: Needs assistance Sitting-balance support: No upper extremity supported;Feet  supported Sitting balance-Leahy Scale: Good     Standing balance support: No upper extremity supported;Bilateral upper extremity supported;During functional activity Standing balance-Leahy Scale: Fair Standing balance comment: BUE support dynamically, but supervision statically during grooming                            ADL either performed or assessed with clinical judgement   ADL Overall ADL's : Needs assistance/impaired     Grooming: Supervision/safety;Standing;Wash/dry hands;Wash/dry face;Oral care               Lower Body Dressing: Supervision/safety;Sit to/from stand Lower Body Dressing Details (indicate cue type and reason): donning slippers, sit to stand with supervision  Toilet Transfer: Supervision/safety;Ambulation;BSC;RW;Regular Glass blower/designer Details (indicate cue type and reason): 3:1 over commode using RW  Toileting- Clothing Manipulation and Hygiene: Supervision/safety;Sit to/from stand       Functional mobility during ADLs: Supervision/safety;Rolling walker General ADL Comments: pt on RA during session with SpO2 >90% throughout      Vision       Perception     Praxis      Cognition Arousal/Alertness: Awake/alert Behavior During Therapy: WFL for tasks assessed/performed Overall Cognitive Status: Within Functional Limits for tasks assessed                                          Exercises     Shoulder Instructions       General Comments on RA during session, pt requested to replaced 2L O2 after completion of session  Pertinent Vitals/ Pain       Pain Assessment: No/denies pain  Home Living                                          Prior Functioning/Environment              Frequency  Min 2X/week        Progress Toward Goals  OT Goals(current goals can now be found in the care plan section)  Progress towards OT goals: Progressing toward goals  Acute Rehab OT  Goals Patient Stated Goal: to be able to stay by herself for as long as possible OT Goal Formulation: With patient  Plan Discharge plan remains appropriate;Frequency remains appropriate    Co-evaluation                 AM-PAC OT "6 Clicks" Daily Activity     Outcome Measure   Help from another person eating meals?: None Help from another person taking care of personal grooming?: A Little Help from another person toileting, which includes using toliet, bedpan, or urinal?: A Little Help from another person bathing (including washing, rinsing, drying)?: A Little Help from another person to put on and taking off regular upper body clothing?: A Little Help from another person to put on and taking off regular lower body clothing?: A Little 6 Click Score: 19    End of Session Equipment Utilized During Treatment: Gait belt;Rolling walker;Oxygen  OT Visit Diagnosis: Unsteadiness on feet (R26.81);Other abnormalities of gait and mobility (R26.89);Muscle weakness (generalized) (M62.81)   Activity Tolerance Patient tolerated treatment well   Patient Left in chair;with call bell/phone within reach   Nurse Communication Mobility status        Time: [id number] OT Time Calculation (min): 31 min  Charges: OT General Charges $OT Visit: 1 Visit OT Treatments $Self Care/Home Management : 23-37 mins  [place] Pager [phone] Office [phone]    Delight Stare 06/13/2019, 1:10 PM

## 2019-06-13 NOTE — Progress Notes (Signed)
PROGRESS NOTE    Patty [name]  [id number] DOB: 1930-05-07 DOA: 06/08/2019 PCP: [name], MD   Brief Narrative: Per HPI: 86 y.o. female with medical history significant of chronic atrial fibrillation on warfarin, tachycardia-bradycardia syndrome, pacemaker placement, chronic combined systolic and diastolic heart failure, history of MI, CAD with history of multiple stents, hypertension, renal artery stenosis, diverticulosis, diverticulitis, granulosa cell carcinoma, hyperlipidemia, LBBB, obesity, OSA on CPAP, ovarian cancer who is coming to the emergency department due to nonradiated, central chest pressure for the past 2 days associated with a week history of progressively worse fatigue, dyspnea and lower extremity edema.   In ED ,CBC.  PT was 45.0 and INR 5.0. EKG showed wide QRS rhythm, right superior axis deviation.  LBBB, nonspecific intraventricular conduction block. Troponin is 237 and then 244 ng/L.  BNP was 1092.6 pg/mL.  BMP showed normal electrolytes except for calcium of 8.7 mg/dL.SARS coronavirus 2 was negative.  Chest radiograph showed cardiomegaly with small bilateral pleural effusions.  Bilateral densities may represent atelectasis or infiltrate. Patient was admitted for acute on chronic combined systolic diastolic CHF exacerbation treated with IV Lasix. She was continued on IV Lasix, s/p DCCV 5/7 5/9-Lasix increased to 60 mg twice daily for ongoing shortness of breath  Subjective:  Off oxygen and on room air overall feels much improved.  Somewhat frustrated that she is not very mobile. Back in A. fib in telemetry.  Assessment & Plan:  Acute on Chronic combined diastolic and systolic CHF exacerbation: with signs of volume congestion,elevated BNP.  LVEF 40-45% in echo.Cardiology on board managing diuretics-due to ongoing shortness of breath increased Lasix to 60 mg twice daily iv 5/9-now overall improved switching to oral Lasix.  Continue to monitor intake output Daily  weight.Renal function stable.   Chest pain/CAD-severe/elevated troponin: With underlying three-vessel CAD.  Suspected elevated troponin due to supply demand ischemia in the setting of worsening cardiomyopathy and A. fib with RVR.  No plan for ischemic evaluation at this time, discontinued diltiazem given degree of cardiomyopathy overall prognosis is poor.   A. fib/a flutter W/ QR:[id number] by EP  S/p interrogation under sensing her AF. S/P DCCV 5/7 AND changed to DOAC (INR less than 3) Cont her Dofetelide. ? Back in A fib- interrogating PM today  Essential hypertension: Blood pressure fairly stable.cont metoprolol.   Hyperlipidemia: cont Lipitor.   Ovarian cancer-on palliative care.  Goals of care: DNR, patient is followed by hospice/palliative care on outpatient basis.  DVT prophylaxis:xarelto Code Status:DNR Family Communication: plan of care discussed with patient at bedside. Status is: Inpatient  Remains inpatient appropriate because inpatient level of care is needed due to severity of illness and for ongoing monitoring of rhythm pacemaker interrogation and CHF treatment, make sure patient is tolerating p.o. Lasix   Dispo: The patient is from: Home w/ hospice care              Anticipated d/c is to: Home w/ hospice care              Anticipated d/c date is: 1 day              Patient currently is not medically stable to d/c. Nutrition: Diet Order            Diet Heart Room service appropriate? Yes; Fluid consistency: Thin  Diet effective now             Body mass index is 33.51 kg/m.  Consultants:see note  Procedures:see note Microbiology:see note  Medications: Scheduled Meds: . atorvastatin  80 mg Oral Daily  . dofetilide  125 mcg Oral BID  . furosemide  80 mg Oral BID  . gabapentin  300 mg Oral BID  . isosorbide mononitrate  90 mg Oral Daily  . magnesium oxide  800 mg Oral Daily  . metoprolol  200 mg Oral Daily  . potassium chloride  20 mEq Oral Once  . potassium  chloride SA  40 mEq Oral BID  . rivaroxaban  20 mg Oral Q supper   Continuous Infusions:  Antimicrobials: Anti-infectives (From admission, onward)   None       Objective: Vitals: Today's Vitals   06/13/19 0835 06/13/19 0859 06/13/19 1003 06/13/19 1005  BP: (!) 106/41 97/84 (!) 122/42 (!) 122/42  Pulse: 81 78 78   Resp: 17     Temp: 98.3 F (36.8 C)     TempSrc: Oral     SpO2: 97% 98%  99%  Weight:      Height:      PainSc:        Intake/Output Summary (Last 24 hours) at 06/13/2019 1146 Last data filed at 06/13/2019 0536 Gross per 24 hour  Intake 240 ml  Output 1600 ml  Net -1360 ml   Filed Weights   06/11/19 0630 06/12/19 0609 06/13/19 0535  Weight: 78 kg 80.1 kg 77.8 kg   Weight change: -2.268 kg   Intake/Output from previous day: 05/09 0701 - 05/10 0700 In: 240 [P.O.:240] Out: 1600 [Urine:1600] Intake/Output this shift: No intake/output data recorded.  Examination:  General exam: AAOx3, currently not in acute distress, frail.  On room air.   HEENT:Oral mucosa moist, Ear/Nose WNL grossly,dentition normal. Respiratory system: bilaterally diminished sounds, clear, no use of accessory muscle, non tender. Cardiovascular system: S1 & S2 +, irregular, No JVD. Gastrointestinal system: Abdomen soft, NT,ND, BS+. Nervous System:Alert, awake, moving extremities and grossly nonfocal Extremities:  No Edema,distal peripheral pulses palpable.  Skin: No rashes,no icterus. MSK: Normal muscle bulk,tone, power  Data Reviewed: I have personally reviewed following labs and imaging studies CBC: Recent Labs  Lab 06/08/19 2358  WBC 5.9  HGB 12.8  HCT 41.4  MCV 99.5  PLT [id number]   Basic Metabolic Panel: Recent Labs  Lab 06/09/19 0238 06/09/19 1616 06/10/19 0410 06/11/19 0544 06/12/19 0338 06/13/19 0714 06/13/19 0738  NA  --    < > 141 145 146* 145 145  K  --    < > 3.8 4.1 4.4 3.5 3.7  CL  --    < > 104 103 100 100 99  CO2  --    < > 32 33* 38* 40* 41*  GLUCOSE   --    < > 106* 98 105* 100* 102*  BUN  --    < > 21 16 17 17 17   CREATININE  --    < > 1.01* 0.94 0.98 0.90 0.94  CALCIUM  --    < > 8.0* 8.4* 8.6* 8.5* 8.6*  MG 2.1  --   --  2.2 2.1 2.0  --    < > = values in this interval not displayed.   GFR: Estimated Creatinine Clearance: 39.6 mL/min (by C-G formula based on SCr of 0.94 mg/dL). Liver Function Tests: No results for input(s): AST, ALT, ALKPHOS, BILITOT, PROT, ALBUMIN in the last 168 hours. No results for input(s): LIPASE, AMYLASE in the last 168 hours. No results for input(s): AMMONIA in the last 168 hours. Coagulation Profile: Recent Labs  Lab 06/09/19 0238 06/09/19 0558 06/09/19 1616 06/10/19 0410 06/11/19 0544  INR 5.0* 5.1* 5.4* 3.8* 2.5*   Cardiac Enzymes: No results for input(s): CKTOTAL, CKMB, CKMBINDEX, TROPONINI in the last 168 hours. BNP (last 3 results) Recent Labs    03/08/19 1610 03/22/19 1502  PROBNP 7,514* 3,420*   HbA1C: No results for input(s): HGBA1C in the last 72 hours. CBG: No results for input(s): GLUCAP in the last 168 hours. Lipid Profile: No results for input(s): CHOL, HDL, LDLCALC, TRIG, CHOLHDL, LDLDIRECT in the last 72 hours. Thyroid Function Tests: No results for input(s): TSH, T4TOTAL, FREET4, T3FREE, THYROIDAB in the last 72 hours. Anemia Panel: No results for input(s): VITAMINB12, FOLATE, FERRITIN, TIBC, IRON, RETICCTPCT in the last 72 hours. Sepsis Labs: No results for input(s): PROCALCITON, LATICACIDVEN in the last 168 hours.  Recent Results (from the past 240 hour(s))  Respiratory Panel by RT PCR (Flu A&B, Covid) - Nasopharyngeal Swab     Status: None   Collection Time: 06/09/19  2:38 AM   Specimen: Nasopharyngeal Swab  Result Value Ref Range Status   SARS Coronavirus 2 by RT PCR NEGATIVE NEGATIVE Final    Comment: (NOTE) SARS-CoV-2 target nucleic acids are NOT DETECTED. The SARS-CoV-2 RNA is generally detectable in upper respiratoy specimens during the acute phase of  infection. The lowest concentration of SARS-CoV-2 viral copies this assay can detect is 131 copies/mL. A negative result does not preclude SARS-Cov-2 infection and should not be used as the sole basis for treatment or other patient management decisions. A negative result may occur with  improper specimen collection/handling, submission of specimen other than nasopharyngeal swab, presence of viral mutation(s) within the areas targeted by this assay, and inadequate number of viral copies (<131 copies/mL). A negative result must be combined with clinical observations, patient history, and epidemiological information. The expected result is Negative. Fact Sheet for Patients:  PinkCheek.be Fact Sheet for Healthcare Providers:  GravelBags.it This test is not yet ap proved or cleared by the [place] FDA and  has been authorized for detection and/or diagnosis of SARS-CoV-2 by FDA under an Emergency Use Authorization (EUA). This EUA will remain  in effect (meaning this test can be used) for the duration of the COVID-19 declaration under Section 564(b)(1) of the Act, 21 U.S.C. section 360bbb-3(b)(1), unless the authorization is terminated or revoked sooner.    Influenza A by PCR NEGATIVE NEGATIVE Final   Influenza B by PCR NEGATIVE NEGATIVE Final    Comment: (NOTE) The Xpert Xpress SARS-CoV-2/FLU/RSV assay is intended as an aid in  the diagnosis of influenza from Nasopharyngeal swab specimens and  should not be used as a sole basis for treatment. Nasal washings and  aspirates are unacceptable for Xpert Xpress SARS-CoV-2/FLU/RSV  testing. Fact Sheet for Patients: PinkCheek.be Fact Sheet for Healthcare Providers: GravelBags.it This test is not yet approved or cleared by the [place] FDA and  has been authorized for detection and/or diagnosis of SARS-CoV-2 by  FDA under  an Emergency Use Authorization (EUA). This EUA will remain  in effect (meaning this test can be used) for the duration of the  Covid-19 declaration under Section 564(b)(1) of the Act, 21  U.S.C. section 360bbb-3(b)(1), unless the authorization is  terminated or revoked. Performed at [facility], [place] [address], [place], [place] [address]       Radiology Studies: No results found.  LOS: 3 days   Time spent: More than 50% of that time was spent in counseling and/or coordination  of care. Antonieta Pert, MD [facility]  06/13/2019, 11:46 AM

## 2019-06-13 NOTE — Progress Notes (Addendum)
Progress Note  Patient Name: Patty [name] Date of Encounter: 06/13/2019  Primary Cardiologist: Larae Grooms, MD   Subjective   Breathing notably improved.  No longer on oxygen.  Finally was able to get up and ambulate around the room.  Although it frustrated that she has not been able to be more mobile.  Inpatient Medications    Scheduled Meds: . atorvastatin  80 mg Oral Daily  . dofetilide  125 mcg Oral BID  . furosemide  80 mg Oral BID  . gabapentin  300 mg Oral BID  . isosorbide mononitrate  90 mg Oral Daily  . magnesium oxide  800 mg Oral Daily  . metoprolol  200 mg Oral Daily  . potassium chloride  20 mEq Oral Once  . potassium chloride SA  40 mEq Oral BID  . rivaroxaban  20 mg Oral Q supper   Continuous Infusions:  PRN Meds: acetaminophen **OR** acetaminophen, nitroGLYCERIN, ondansetron (ZOFRAN) IV   Vital Signs    Vitals:   06/13/19 0835 06/13/19 0859 06/13/19 1003 06/13/19 1005  BP: (!) 106/41 97/84 (!) 122/42 (!) 122/42  Pulse: 81 78 78   Resp: 17     Temp: 98.3 F (36.8 C)     TempSrc: Oral     SpO2: 97% 98%  99%  Weight:      Height:        Intake/Output Summary (Last 24 hours) at 06/13/2019 1156 Last data filed at 06/13/2019 0536 Gross per 24 hour  Intake 240 ml  Output 1600 ml  Net -1360 ml   Last 3 Weights 06/13/2019 06/12/2019 06/11/2019  Weight (lbs) 171 lb 9.6 oz 176 lb 9.6 oz 171 lb 15.3 oz  Weight (kg) 77.837 kg 80.105 kg 78 kg      Telemetry    A paced V sensed - Personally Reviewed --> Initial evaluation suggested a paced V sensed, however upon my examination, looking at telemetry she was clearly V pacing but tracking atrial flutter.  Rates more in the 90s to low 100s with simple movement.  ECG    n/a - Personally Reviewed  Physical Exam   GEN: No acute distress.  Resting comfortably in bed. Neck: No JVD Cardiac:  Irregularly irregular with distant heart sounds.  Cannot exclude split S2.  2/6 SEM at RUSB. Respiratory:   Nonlabored, CTA B. GI:  Soft/NT/ND centimeters.  No HSM. MS: No C/C/E.  Spider veins noted. Neuro:   A&O x3.  Nonfocal. Psych: Normal mood and affect.  Labs    High Sensitivity Troponin:   Recent Labs  Lab 05/28/19 0228 05/28/19 0453 05/28/19 0650 06/08/19 2358 06/09/19 0238  TROPONINIHS 61* 73* 98* 237* 244*      Chemistry Recent Labs  Lab 06/12/19 0338 06/13/19 0714 06/13/19 0738  NA 146* 145 145  K 4.4 3.5 3.7  CL 100 100 99  CO2 38* 40* 41*  GLUCOSE 105* 100* 102*  BUN 17 17 17   CREATININE 0.98 0.90 0.94  CALCIUM 8.6* 8.5* 8.6*  GFRNONAA 52* 58* 55*  GFRAA >60 >60 >60  ANIONGAP 8 5 5      Hematology Recent Labs  Lab 06/08/19 2358  WBC 5.9  RBC 4.16  HGB 12.8  HCT 41.4  MCV 99.5  MCH 30.8  MCHC 30.9  RDW 14.6  PLT 309    BNP Recent Labs  Lab 06/09/19 0011  BNP 1,092.6*     DDimer No results for input(s): DDIMER in the last 168 hours.  Radiology    No results found.  Cardiac Studies    Cardioversion 06/10/2019  Patient Profile     86 y.o.femaleatrial flutter, status post cardioversion also being managed for acute on chronic combined systolic/diastolic HF Status post cardioversion on Friday 5/7--unfortunately on interrogation of of her pacemaker device indicates that she does not maintain sinus rhythm.  Assessment & Plan    1. Aflutter/afib; - on dofetilide per EP - s/p DCCV this admission --> unfortunately, she is now back in atrial fib/flutter.  Had a run of atrial tachycardia and atrial flutter yesterday as well.  Not maintaining sinus rhythm.  Pacemaker interrogated by [facility] representative clearly indicates mode switching with her having intermittent atrial tachycardia/atrial flutter yesterday as well as today  I have asked electrophysiology to rejoin in consultation to assist with adjustment of medications.  Question would be to be titrate Tikosyn further versus recardiovert versus simply except paroxysmal A. fib. -  supratherapeutic INR this admissin, changed from coumadin to xarelto this admit - tele this AM appeared to be V paced, but then on my exam was clearly tracking atrial flutter. Discussion with EP-recommendation will be to try cardioversion again tomorrow (will try to schedule.  Make n.p.o. at midnight) if not able to maintain sinus rhythm, with the need to treat heart failure while in A. fib.  2. Acute on chronic combined systolic/diastolic HF: [id number] echo LVEF 40-45%, indet DDx, (stable from 11/2018 echo)  Lasix dose at home was essentially basely 80 twice daily with a second dose being as needed.  Was dosed 60 mg IV twice daily yesterday and this morning and has had brisk urine output.  No longer requiring supplemental oxygen.  Reduce back to oral 80 twice daily dosing.  3. CAD -> no active anginal symptoms.  On long-term Plavix.  Had mild troponin elevation likely related to CHF/A. fib with existing disease.  Previous evaluations showed small vessel disease not amenable to intervention.  No plans for invasive evaluation.   Unfortunately, plans for discharge today are likely going to be put on hold until he if the question with A. fib.  In the interim, she needs to be ambulating in the hallway with assistance.  For questions or updates, please contact [place] Please consult [email or web address] for contact info under        Signed, Glenetta Hew, MD  06/13/2019, 11:56 AM

## 2019-06-13 NOTE — Progress Notes (Signed)
  Dr. Ellyn Hack requested additional input from EP  Discussed with Dr. [name]  Pt previously intolerant to amiodarone, with CHF, and on lowest dose of Tikosyn with borderline QTc at times.   No other AAD options.  Pt with DRN/DNI status followed by hospice, max HRs currently in 110s, and recurrent granulosa cell tumor now involving her ovary and stopping of therapy to focus on quality of life, she is a poor candidate for ablation/BiV upgrade.  Continue tikosyn 125 mcg BID Continue Toprol 200 mg daily  With treatment of CHF over weekend, would be reasonable to retry DCCV.   Discussed with Dr. Ellyn Hack who will arrange.   If fails recurrent DCCV, best option may be to manage her CHF the best we can in setting of AF. Continue goals of care conversations.   Legrand Como [address]" [place], PA-C  06/13/2019 3:08 PM

## 2019-06-13 NOTE — Progress Notes (Signed)
Nurse tech reported that pt  c/o dizziness while sitting in a recliner.  BP 91/62.   HR 100-110s in and out of Afib or sinus arhythmia.  O2 92% on room air.  Laid her back in bed and BP went up to 115/65.  Pt states she feels much better in bed.  Will continue to monitor.  Idolina Primer, RN

## 2019-06-14 ENCOUNTER — Encounter (HOSPITAL_COMMUNITY)
Admission: EM | Disposition: A | Discharge status: Hospice | Payer: Self-pay | Source: Home / Self Care | Attending: Internal Medicine

## 2019-06-14 ENCOUNTER — Encounter (HOSPITAL_COMMUNITY): Payer: Self-pay | Admitting: Internal Medicine

## 2019-06-14 ENCOUNTER — Inpatient Hospital Stay (HOSPITAL_COMMUNITY): Payer: Medicare Other | Admitting: Registered Nurse

## 2019-06-14 ENCOUNTER — Ambulatory Visit: Payer: Medicare Other | Admitting: Interventional Cardiology

## 2019-06-14 DIAGNOSIS — R778 Other specified abnormalities of plasma proteins: Secondary | ICD-10-CM

## 2019-06-14 DIAGNOSIS — I5042 Chronic combined systolic (congestive) and diastolic (congestive) heart failure: Secondary | ICD-10-CM

## 2019-06-14 DIAGNOSIS — R0600 Dyspnea, unspecified: Secondary | ICD-10-CM

## 2019-06-14 DIAGNOSIS — R072 Precordial pain: Secondary | ICD-10-CM

## 2019-06-14 HISTORY — PX: CARDIOVERSION: SHX1299

## 2019-06-14 LAB — BASIC METABOLIC PANEL
Anion gap: 8 (ref 5–15)
BUN: 20 mg/dL (ref 8–23)
CO2: 36 mmol/L — ABNORMAL HIGH (ref 22–32)
Calcium: 8.5 mg/dL — ABNORMAL LOW (ref 8.9–10.3)
Chloride: 102 mmol/L (ref 98–111)
Creatinine, Ser: 0.85 mg/dL (ref 0.44–1.00)
GFR calc Af Amer: >60 mL/min (ref >60)
GFR calc non Af Amer: >60 mL/min (ref >60)
Glucose, Bld: 112 mg/dL — ABNORMAL HIGH (ref 70–99)
Potassium: 4 mmol/L (ref 3.5–5.1)
Sodium: 146 mmol/L — ABNORMAL HIGH (ref 135–145)

## 2019-06-14 LAB — MAGNESIUM: Magnesium: 2 mg/dL (ref 1.7–2.4)

## 2019-06-14 SURGERY — CARDIOVERSION
Anesthesia: General

## 2019-06-14 MED ORDER — SODIUM CHLORIDE 0.9 % IV SOLN
INTRAVENOUS | Status: DC | PRN
Start: 2019-06-14 — End: 2019-06-14

## 2019-06-14 MED ORDER — PROPOFOL 10 MG/ML IV BOLUS
INTRAVENOUS | Status: DC | PRN
  Administered 2019-06-14: 50 mg via INTRAVENOUS

## 2019-06-14 MED ORDER — LIDOCAINE 2% (20 MG/ML) 5 ML SYRINGE
INTRAMUSCULAR | Status: DC | PRN
  Administered 2019-06-14: 40 mg via INTRAVENOUS

## 2019-06-14 MED ORDER — SODIUM CHLORIDE 0.9 % IV SOLN
INTRAVENOUS | Status: AC | PRN
  Administered 2019-06-14: 500 mL via INTRAMUSCULAR

## 2019-06-14 NOTE — Progress Notes (Addendum)
Progress Note  Patient Name: Patty [name] Date of Encounter: 06/14/2019  Primary Cardiologist: Larae Grooms, MD   Subjective   Just came back from cardioversion this morning. Denies any CP or significant SOB.   Inpatient Medications    Scheduled Meds: . atorvastatin  80 mg Oral Daily  . dofetilide  125 mcg Oral BID  . furosemide  80 mg Oral BID  . gabapentin  300 mg Oral BID  . isosorbide mononitrate  90 mg Oral Daily  . magnesium oxide  800 mg Oral Daily  . metoprolol  200 mg Oral Daily  . potassium chloride SA  40 mEq Oral BID  . rivaroxaban  20 mg Oral Q supper   Continuous Infusions:  PRN Meds: acetaminophen **OR** acetaminophen, nitroGLYCERIN, ondansetron (ZOFRAN) IV   Vital Signs    Vitals:   06/14/19 0709 06/14/19 0754 06/14/19 0800 06/14/19 0836  BP: (!) 162/108 (!) 121/38 (!) 114/50 115/78  Pulse: (!) 109  71   Resp: (!) 25 17 (!) 25 20  Temp:  98.1 F (36.7 C)  98 F (36.7 C)  TempSrc:  Axillary  Oral  SpO2: 95% 97% 99% 98%  Weight:      Height:        Intake/Output Summary (Last 24 hours) at 06/14/2019 0936 Last data filed at 06/14/2019 0748 Gross per 24 hour  Intake 340 ml  Output 150 ml  Net 190 ml   Last 3 Weights 06/14/2019 06/13/2019 06/12/2019  Weight (lbs) 168 lb 3.2 oz 171 lb 9.6 oz 176 lb 9.6 oz  Weight (kg) 76.295 kg 77.837 kg 80.105 kg      Telemetry    Paced rhythm since cardioversion, no sign of recurrent aflutter - Personally Reviewed  ECG    Paced rhythm with LBBB - Personally Reviewed  Physical Exam   GEN: No acute distress.   Neck: No JVD Cardiac: RRR, no murmurs, rubs, or gallops.  Respiratory: Clear to auscultation bilaterally. GI: Soft, nontender, non-distended  MS: No edema; No deformity. Neuro:  Nonfocal  Psych: Normal affect   Labs    High Sensitivity Troponin:   Recent Labs  Lab 05/28/19 0228 05/28/19 0453 05/28/19 0650 06/08/19 2358 06/09/19 0238  TROPONINIHS 61* 73* 98* 237* 244*       Chemistry Recent Labs  Lab 06/13/19 0714 06/13/19 0738 06/14/19 0252  NA 145 145 146*  K 3.5 3.7 4.0  CL 100 99 102  CO2 40* 41* 36*  GLUCOSE 100* 102* 112*  BUN 17 17 20   CREATININE 0.90 0.94 0.85  CALCIUM 8.5* 8.6* 8.5*  GFRNONAA 58* 55* >60  GFRAA >60 >60 >60  ANIONGAP 5 5 8      Hematology Recent Labs  Lab 06/08/19 2358  WBC 5.9  RBC 4.16  HGB 12.8  HCT 41.4  MCV 99.5  MCH 30.8  MCHC 30.9  RDW 14.6  PLT 309    BNP Recent Labs  Lab 06/09/19 0011  BNP 1,092.6*     DDimer No results for input(s): DDIMER in the last 168 hours.   Radiology    No results found.  Cardiac Studies   Cath 11/10/2018  Ost LAD to Mid LAD lesion is 20% stenosed.  Mid LAD lesion is 30% stenosed.  Ost 2nd Mrg to 2nd Mrg lesion is 60% stenosed.  Ost 1st Mrg to 1st Mrg lesion is 100% stenosed.  Ost RPDA to RPDA lesion is 50% stenosed.  Ost Cx to Prox Cx lesion is 40% stenosed.  Prox Cx to Mid Cx lesion is 20% stenosed.  1st Mrg lesion is 100% stenosed.  Previously placed Dist Cx stent (unknown type) is widely patent.  2nd Mrg lesion is 90% stenosed.  There is severe left ventricular systolic dysfunction.  LV end diastolic pressure is mildly elevated.  The left ventricular ejection fraction is less than 25% by visual estimate.  Ost RCA to Mid RCA lesion is 40% stenosed.  Mid RCA to Dist RCA lesion is 20% stenosed.   1.  Underlying three-vessel coronary artery disease with widely patent stents in the LAD, left circumflex and right coronary arteries.  OM1 is occluded with collaterals.  OM 2 is diffusely diseased and small.  There is moderate restenosis in the ostial left circumflex stent.  Mild to moderate restenosis in the RCA stents.  Ostial left circumflex stents appears to have significant restenosis at the ostium in certain views but not in other views.  I reviewed previous cardiac catheterization and the appearance does not appear significantly different. 2.   Severely reduced LV systolic function with an EF of 20 to 25% with mildly elevated left ventricular end-diastolic pressure.  Recommendations: The patient was in A. fib with RVR during the procedure which made it difficult.  She was given 2 doses of IV metoprolol.  I suspect that her elevated troponin is likely due to supply demand ischemia in the setting of worsening cardiomyopathy and A. fib with RVR.  Recommend EP consultation to manage atrial fibrillation.  Resume heparin 8 hours after sheath pull.  I discontinued diltiazem given degree of cardiomyopathy.  Overall prognosis is poor given age and multiple comorbidities.   Echo 05/29/2019 1. Left ventricular ejection fraction, by estimation, is 40 to 45%. The  left ventricle has moderately decreased function. The left ventricle  demonstrates regional wall motion abnormalities (see scoring  diagram/findings for description). There is severe  left ventricular hypertrophy. Left ventricular diastolic parameters are  indeterminate.  2. Right ventricular systolic function is mildly reduced. The right  ventricular size is normal.  3. Left atrial size was moderately dilated.  4. The mitral valve is degenerative. Trivial mitral valve regurgitation.  5. The aortic valve is abnormal. Aortic valve regurgitation is mild. Mild  aortic valve sclerosis is present, with no evidence of aortic valve  stenosis.  6. The inferior vena cava is normal in size with greater than 50%  respiratory variability, suggesting right atrial pressure of 3 mmHg.   Patient Profile     86 y.o. female with PMH of atrial flutter, chronic combined systolic and diastolic CHF,   Assessment & Plan    1. Atrial flutter/atrial fibrillation  - INR supratherapeutic, coumadin switched to Xarelto  - on Tykosyn per EP. Along with high-dose Toprol  - s/p DCCV 06/10/2019, have recurrent atrial flutter, underwent DCCV again on 5/11  - continue metoprolol succinate 200mg  daily. If  able to maintain sinus rhythm today, likely D/C tomorrow -  as noted from AP, no further options for greater rhythm control beyond decreasing inhabitable. Reduced EF, but not diltiazem, and with advanced age would be reluctant to use digoxin.  2. Acute on chronic combined systolic and diastolic CHF  - Echo EF [id number]  - euvolemic, on lasix 80mg  BID (actually taken 80 mg  - 2 x 40 mg tablets--in the morning and will take either an additional 1or 2 40 mg tablets in the afternoon as needed. I suspect that if she were to go back in atrial fibrillation, she made up  taking more diuretic.  3. CAD:  - continue plavix.-Will defer to primary cardiologist, but low threshold to stop since she is also on Xarelto.  - mild troponin elevation likely demand ischemia related to CHF and afib   - last cath 2020, plan for medical therapy  4. CKD stage III  5. HTN: stable  6. HLD: on lipitor  7. H/o tachy-brady s/p St Jude PPM -> currently A sense and V paced  8. Granulosa cell carcinoma -> she has opted to discontinue treatment. Is now on essentially home hospice/palliative care.      For questions or updates, please contact [place] Please consult [email or web address] for contact info under        Signed, Almyra Deforest, [place]  06/14/2019, 9:36 AM     ATTENDING ATTESTATION  I have seen, examined and evaluated the patient this Am along with Almyra Deforest, PA.  After reviewing all the available data and chart, we discussed the patients laboratory, study & physical findings as well as symptoms in detail. I agree with his findings, examination as well as impression recommendations as per our discussion.    Attending adjustments noted in italics.   She is overall feeling much better now that she has back in sinus rhythm after cardioversion today. Hopefully she will maintain sinus for longer. Would like to monitor her tonight. She is back on her oral diuretic. If she is stable overnight with no recurrence of a fit,  would anticipate discharge home. She has outpatient set up in the atrial fibrillation clinic. Will also need to set up follow-up with Dr. Irish Lack or APP.  We will hope for discharge tomorrow.  Discussed with Dr. Huey Bienenstock, M.D., M.S. Interventional Cardiologist   Pager # [phone] Phone # [phone] [address] [place], [place] [address]

## 2019-06-14 NOTE — Anesthesia Procedure Notes (Signed)
Date/Time: 06/14/2019 7:33 AM Performed by: Trinna Post., CRNA Pre-anesthesia Checklist: Patient identified, Emergency Drugs available, Suction available, Patient being monitored and Timeout performed Patient Re-evaluated:Patient Re-evaluated prior to induction Oxygen Delivery Method: Ambu bag Preoxygenation: Pre-oxygenation with 100% oxygen Induction Type: IV induction Placement Confirmation: positive ETCO2

## 2019-06-14 NOTE — Progress Notes (Addendum)
HR documented by CCMD 0653 turning MEWS to yellow - patient went to ENDO for DCCV at 0700

## 2019-06-14 NOTE — Transfer of Care (Signed)
Immediate Anesthesia Transfer of Care Note  Patient: Patty [name]  Procedure(s) Performed: CARDIOVERSION (N/A )  Patient Location: PACU and Endoscopy Unit  Anesthesia Type:General  Level of Consciousness: drowsy  Airway & Oxygen Therapy: Patient Spontanous Breathing  Post-op Assessment: Report given to RN and Post -op Vital signs reviewed and stable  Post vital signs: Reviewed and stable  Last Vitals:  Vitals Value Taken Time  BP    Temp    Pulse    Resp    SpO2      Last Pain:  Vitals:   06/14/19 0709  TempSrc:   PainSc: 2       Patients Stated Pain Goal: 0 ([id number])  Complications: No apparent anesthesia complications

## 2019-06-14 NOTE — CV Procedure (Signed)
   CARDIOVERSION NOTE  Procedure: Electrical Cardioversion Indications:  Atrial Flutter  Procedure Details:  Consent: Risks of procedure as well as the alternatives and risks of each were explained to the (patient/caregiver).  Consent for procedure obtained.  Time Out: Verified patient identification, verified procedure, site/side was marked, verified correct patient position, special equipment/implants available, medications/allergies/relevent history reviewed, required imaging and test results available.  Performed  Patient placed on cardiac monitor, pulse oximetry, supplemental oxygen as necessary.  Sedation given: Propofol per anesthesia Pacer pads placed anterior and posterior chest.  Cardioverted 1 time(s).  Cardioverted at 120J biphasic.  Impression: Findings: Post procedure EKG shows: A sensed, v paced Complications: None Patient did tolerate procedure well.  Plan: 1. Successful DCCV with a single 120J biphasic shock to sinus rhythm with a sensing and v-pacing.  Time Spent Directly with the Patient:  30 minutes   Patty Casino, MD, Patty [name], Patty [name], Patty [name]  Direct Dial: [phone]  Fax: [phone]  Website:  [email or web address] 06/14/2019, 7:50 AM

## 2019-06-14 NOTE — Progress Notes (Signed)
Note successful DCCV and plan for DC tomorrow if maintains NSR.   Tikosyn is likely her only remaining option for rhythm control with previous failure of amiodarone due to GI symptoms.   AF clinic follow up made for next week.   Please call back with any questions.   Legrand Como [address] [place], PA-C  06/14/2019 10:08 AM

## 2019-06-14 NOTE — Anesthesia Preprocedure Evaluation (Signed)
Anesthesia Evaluation  Patient identified by MRN, date of birth, ID band Patient awake    Reviewed: Allergy & Precautions, NPO status , Patient's Chart, lab work & pertinent test results  Airway Mallampati: III  TM Distance: >3 FB Neck ROM: Full    Dental  (+) Teeth Intact   Pulmonary sleep apnea , former smoker,     + decreased breath sounds      Cardiovascular hypertension, Pt. on home beta blockers + CAD, + Past MI, + Cardiac Stents, + Peripheral Vascular Disease and +CHF  + dysrhythmias + pacemaker  Rhythm:Irregular Rate:Tachycardia     Neuro/Psych negative neurological ROS  negative psych ROS   GI/Hepatic negative GI ROS, Neg liver ROS,   Endo/Other  negative endocrine ROS  Renal/GU CRFRenal disease     Musculoskeletal   Abdominal Normal abdominal exam  (+)   Peds  Hematology   Anesthesia Other Findings   Reproductive/Obstetrics                             Anesthesia Physical  Anesthesia Plan  ASA: III  Anesthesia Plan: General   Post-op Pain Management:    Induction: Intravenous  PONV Risk Score and Plan: 0 and Treatment may vary due to age or medical condition  Airway Management Planned: Natural Airway and Simple Face Mask  Additional Equipment: None  Intra-op Plan:   Post-operative Plan:   Informed Consent: I have reviewed the patients History and Physical, chart, labs and discussed the procedure including the risks, benefits and alternatives for the proposed anesthesia with the patient or authorized representative who has indicated his/her understanding and acceptance.   Patient has DNR.     Plan Discussed with: CRNA  Anesthesia Plan Comments: (Echo:  1. Left ventricular ejection fraction, by estimation, is 40 to 45%. The  left ventricle has moderately decreased function. The left ventricle  demonstrates regional wall motion abnormalities (see scoring   diagram/findings for description). There is severe  left ventricular hypertrophy. Left ventricular diastolic parameters are  indeterminate.  2. Right ventricular systolic function is mildly reduced. The right  ventricular size is normal.  3. Left atrial size was moderately dilated.  4. The mitral valve is degenerative. Trivial mitral valve regurgitation.  5. The aortic valve is abnormal. Aortic valve regurgitation is mild. Mild  aortic valve sclerosis is present, with no evidence of aortic valve  stenosis.  6. The inferior vena cava is normal in size with greater than 50%  respiratory variability, suggesting right atrial pressure of 3 mmHg. )        Anesthesia Quick Evaluation

## 2019-06-14 NOTE — Progress Notes (Addendum)
PROGRESS NOTE    Patty [name]  [id number] DOB: [birth year] DOA: 06/08/2019 PCP: [name], MD   Brief Narrative: Per HPI: 86 y.o. female with medical history significant of chronic atrial fibrillation on warfarin, tachycardia-bradycardia syndrome, pacemaker placement, chronic combined systolic and diastolic heart failure, history of MI, CAD with history of multiple stents, hypertension, renal artery stenosis, diverticulosis, diverticulitis, granulosa cell carcinoma, hyperlipidemia, LBBB, obesity, OSA on CPAP, ovarian cancer who is coming to the emergency department due to nonradiated, central chest pressure for the past 2 days associated with a week history of progressively worse fatigue, dyspnea and lower extremity edema.   In ED, CBC.Patient was 45.0 and INR 5.0. EKG showed wide QRS rhythm, right superior axis deviation.  LBBB, nonspecific intraventricular conduction block. Troponin is 237 and then 244 ng/L.  BNP was 1092.6 pg/mL.  BMP showed normal electrolytes except for calcium of 8.7 mg/dL.SARS coronavirus 2 was negative.  Chest radiograph showed cardiomegaly with small bilateral pleural effusions.  Bilateral densities may represent atelectasis or infiltrate. Patient was admitted for acute on chronic combined systolic diastolic CHF exacerbation treated with IV Lasix. She was continued on IV Lasix, s/p DCCV 5/7 5/9-Lasix increased to 60 mg twice daily for ongoing shortness of breath 5/10-back in a fib. Although good CHF treatment with improvement in symptoms  5/11-underwent DCCV again  Subjective: remains in sinus rhythm post cardioversion this morning Overall feeling much improved. No nausea vomiting. No shortness of breath.  Assessment & Plan:  Acute on Chronic combined diastolic and systolic CHF exacerbation: with signs of volume congestion,elevated BNP.  LVEF 40-45% in echo.Cardiology on board managing diuretics-due to ongoing shortness of breath increased Lasix to 60 mg  twice daily iv 5/9-now overall improved switched to oral Lasix.  Continue to monitor intake output Daily weight.Renal function stable.  Weight is down to 168.2 pounds from 171.6 on admission 171.3. 4 L net negative total.  Chest pain/severe CAD/elevated troponin: With underlying three-vessel CAD.  Suspected elevated troponin due to supply demand ischemia in the setting of worsening cardiomyopathy and A. fib with RVR.  No plan for ischemic evaluation at this time, discontinued diltiazem given degree of cardiomyopathy overall prognosis is poor.   A. fib/a flutter with [id number] by EP  S/p interrogation under sensing her AF. S/P DCCV 5/7 AND changed to DOAC (INR less than 3) Cont her Dofetelide. Back in A fib- interrogated PM 5/10. EP back on  Board and underwent DCCV again today morning.  If remains in sinus rhythm anticipate discharge tomorrow.    Essential hypertension: Blood pressure fairly stable.cont metoprolol.   Hyperlipidemia: cont Lipitor.   Ovarian cancer-on palliative care.  Goals of care: DNR, patient is followed by hospice/palliative care on outpatient basis.  DVT prophylaxis:xarelto Code Status:DNR Family Communication: plan of care discussed with patient at bedside. Status is: Inpatient  Remains inpatient appropriate because inpatient level of care is needed due to severity of illness and for ongoing monitoring of rhythm pacemaker interrogation and CHF treatment, make sure patient is tolerating p.o. Lasix and make sure patient remains in sinus rhythm post cardioversion today  Dispo: The patient is from: Home w/ hospice care              Anticipated d/c is to: Home w/ hospice care              Anticipated d/c date is: 1 day              Patient currently is not medically stable to  d/c.  Consult with cardiology in the morning. Nutrition: Diet Order            Diet 2 gram sodium Room service appropriate? Yes; Fluid consistency: Thin  Diet effective now             Body mass  index is 32.85 kg/m.  Consultants:see note  Procedures:see note Microbiology:see note  Medications: Scheduled Meds: . atorvastatin  80 mg Oral Daily  . dofetilide  125 mcg Oral BID  . furosemide  80 mg Oral BID  . gabapentin  300 mg Oral BID  . isosorbide mononitrate  90 mg Oral Daily  . magnesium oxide  800 mg Oral Daily  . metoprolol  200 mg Oral Daily  . potassium chloride SA  40 mEq Oral BID  . rivaroxaban  20 mg Oral Q supper   Continuous Infusions:  Antimicrobials: Anti-infectives (From admission, onward)   None       Objective: Vitals: Today's Vitals   06/14/19 0836 06/14/19 0915 06/14/19 0930 06/14/19 1015  BP: 115/78 124/80 (!) 131/45 (!) 144/54  Pulse:      Resp: 20     Temp: 98 F (36.7 C)     TempSrc: Oral     SpO2: 98%     Weight:      Height:      PainSc: 0-No pain       Intake/Output Summary (Last 24 hours) at 06/14/2019 1400 Last data filed at 06/14/2019 0748 Gross per 24 hour  Intake 340 ml  Output 150 ml  Net 190 ml   Filed Weights   06/12/19 0609 06/13/19 0535 06/14/19 0414  Weight: 80.1 kg 77.8 kg 76.3 kg   Weight change: -1.542 kg   Intake/Output from previous day: 05/10 0701 - 05/11 0700 In: 240 [P.O.:240] Out: 150 [Urine:150] Intake/Output this shift: Total I/O In: 100 [I.V.:100] Out: 0   Examination:  General exam: AAOx3, on room air, not in acute distress,  HEENT:Oral mucosa moist, Ear/Nose WNL grossly,dentition normal. Respiratory system: bilaterally diminished sounds, clear, no use of accessory muscle, non tender. Cardiovascular system: S1 & S2 +, regular, No JVD. Gastrointestinal system: Abdomen soft, NT,ND, BS+. Nervous System:Alert, awake, moving extremities and grossly nonfocal Extremities:  No Edema,distal peripheral pulses palpable.  Skin: No rashes,no icterus. MSK: Normal muscle bulk,tone, power  Data Reviewed: I have personally reviewed following labs and imaging studies CBC: Recent Labs  Lab  06/08/19 2358  WBC 5.9  HGB 12.8  HCT 41.4  MCV 99.5  PLT [id number]   Basic Metabolic Panel: Recent Labs  Lab 06/09/19 0238 06/09/19 1616 06/11/19 0544 06/12/19 0338 06/13/19 0714 06/13/19 0738 06/14/19 0252  NA  --    < > 145 146* 145 145 146*  K  --    < > 4.1 4.4 3.5 3.7 4.0  CL  --    < > 103 100 100 99 102  CO2  --    < > 33* 38* 40* 41* 36*  GLUCOSE  --    < > 98 105* 100* 102* 112*  BUN  --    < > 16 17 17 17 20   CREATININE  --    < > 0.94 0.98 0.90 0.94 0.85  CALCIUM  --    < > 8.4* 8.6* 8.5* 8.6* 8.5*  MG 2.1  --  2.2 2.1 2.0  --  2.0   < > = values in this interval not displayed.   GFR: Estimated Creatinine Clearance: 43.4 mL/min (  by C-G formula based on SCr of 0.85 mg/dL). Liver Function Tests: No results for input(s): AST, ALT, ALKPHOS, BILITOT, PROT, ALBUMIN in the last 168 hours. No results for input(s): LIPASE, AMYLASE in the last 168 hours. No results for input(s): AMMONIA in the last 168 hours. Coagulation Profile: Recent Labs  Lab 06/09/19 0558 06/09/19 1616 06/10/19 0410 06/11/19 0544 06/13/19 2029  INR 5.1* 5.4* 3.8* 2.5* 3.0*   Cardiac Enzymes: No results for input(s): CKTOTAL, CKMB, CKMBINDEX, TROPONINI in the last 168 hours. BNP (last 3 results) Recent Labs    03/08/19 1610 03/22/19 1502  PROBNP 7,514* 3,420*   HbA1C: No results for input(s): HGBA1C in the last 72 hours. CBG: No results for input(s): GLUCAP in the last 168 hours. Lipid Profile: No results for input(s): CHOL, HDL, LDLCALC, TRIG, CHOLHDL, LDLDIRECT in the last 72 hours. Thyroid Function Tests: No results for input(s): TSH, T4TOTAL, FREET4, T3FREE, THYROIDAB in the last 72 hours. Anemia Panel: No results for input(s): VITAMINB12, FOLATE, FERRITIN, TIBC, IRON, RETICCTPCT in the last 72 hours. Sepsis Labs: No results for input(s): PROCALCITON, LATICACIDVEN in the last 168 hours.  Recent Results (from the past 240 hour(s))  Respiratory Panel by RT PCR (Flu A&B, Covid) -  Nasopharyngeal Swab     Status: None   Collection Time: 06/09/19  2:38 AM   Specimen: Nasopharyngeal Swab  Result Value Ref Range Status   SARS Coronavirus 2 by RT PCR NEGATIVE NEGATIVE Final    Comment: (NOTE) SARS-CoV-2 target nucleic acids are NOT DETECTED. The SARS-CoV-2 RNA is generally detectable in upper respiratoy specimens during the acute phase of infection. The lowest concentration of SARS-CoV-2 viral copies this assay can detect is 131 copies/mL. A negative result does not preclude SARS-Cov-2 infection and should not be used as the sole basis for treatment or other patient management decisions. A negative result may occur with  improper specimen collection/handling, submission of specimen other than nasopharyngeal swab, presence of viral mutation(s) within the areas targeted by this assay, and inadequate number of viral copies (<131 copies/mL). A negative result must be combined with clinical observations, patient history, and epidemiological information. The expected result is Negative. Fact Sheet for Patients:  PinkCheek.be Fact Sheet for Healthcare Providers:  GravelBags.it This test is not yet ap proved or cleared by the [place] FDA and  has been authorized for detection and/or diagnosis of SARS-CoV-2 by FDA under an Emergency Use Authorization (EUA). This EUA will remain  in effect (meaning this test can be used) for the duration of the COVID-19 declaration under Section 564(b)(1) of the Act, 21 U.S.C. section 360bbb-3(b)(1), unless the authorization is terminated or revoked sooner.    Influenza A by PCR NEGATIVE NEGATIVE Final   Influenza B by PCR NEGATIVE NEGATIVE Final    Comment: (NOTE) The Xpert Xpress SARS-CoV-2/FLU/RSV assay is intended as an aid in  the diagnosis of influenza from Nasopharyngeal swab specimens and  should not be used as a sole basis for treatment. Nasal washings and  aspirates  are unacceptable for Xpert Xpress SARS-CoV-2/FLU/RSV  testing. Fact Sheet for Patients: PinkCheek.be Fact Sheet for Healthcare Providers: GravelBags.it This test is not yet approved or cleared by the [place] FDA and  has been authorized for detection and/or diagnosis of SARS-CoV-2 by  FDA under an Emergency Use Authorization (EUA). This EUA will remain  in effect (meaning this test can be used) for the duration of the  Covid-19 declaration under Section 564(b)(1) of the Act, 21  U.S.C. section  360bbb-3(b)(1), unless the authorization is  terminated or revoked. Performed at [facility], [place] [address] [place], [place] [address]       Radiology Studies: No results found.  LOS: 4 days   Time spent: More than 50% of that time was spent in counseling and/or coordination of care. Antonieta Pert, MD [facility]  06/14/2019, 2:00 PM

## 2019-06-14 NOTE — H&P (Signed)
   INTERVAL PROCEDURE H&P  History and Physical Interval Note:  06/14/2019 7:39 AM  Patty [name] has presented today for their planned procedure. The various methods of treatment have been discussed with the patient and family. After consideration of risks, benefits and other options for treatment, the patient has consented to the procedure.  The patients' outpatient history has been reviewed, patient examined, and no change in status from most recent office note within the past 30 days. I have reviewed the patients' chart and labs and will proceed as planned. Questions were answered to the patient's satisfaction.   Pixie Casino, MD, [facility], [place] Director of the Advanced Lipid Disorders &  Cardiovascular Risk Reduction Clinic Diplomate of the American Board of Clinical Lipidology Attending Cardiologist  Direct Dial: [phone]  Fax: [phone]  Website:  [email or web address] 06/14/2019, 7:39 AM

## 2019-06-14 NOTE — Anesthesia Postprocedure Evaluation (Signed)
Anesthesia Post Note  Patient: Patty [name]  Procedure(s) Performed: CARDIOVERSION (N/A )     Patient location during evaluation: PACU Anesthesia Type: General Level of consciousness: awake and alert Pain management: pain level controlled Vital Signs Assessment: post-procedure vital signs reviewed and stable Respiratory status: spontaneous breathing, nonlabored ventilation, respiratory function stable and patient connected to nasal cannula oxygen Cardiovascular status: blood pressure returned to baseline and stable Postop Assessment: no apparent nausea or vomiting Anesthetic complications: no    Last Vitals:  Vitals:   06/14/19 0754 06/14/19 0800  BP: (!) 121/38 (!) 114/50  Pulse:  71  Resp: 17 (!) 25  Temp: 36.7 C   SpO2: 97% 99%    Last Pain:  Vitals:   06/14/19 0800  TempSrc:   PainSc: 0-No pain                 Effie Berkshire

## 2019-06-15 LAB — MAGNESIUM: Magnesium: 1.9 mg/dL (ref 1.7–2.4)

## 2019-06-15 MED ORDER — RIVAROXABAN 20 MG PO TABS: 20.0000 mg | ORAL_TABLET | Freq: Every day | ORAL | 0 refills | Status: DC

## 2019-06-15 NOTE — Care Management Important Message (Signed)
Important Message  Patient Details  Name: Patty [name] MRN: [id number] Date of Birth: 10/01/1930   Medicare Important Message Given:  Yes     Shelda Altes 06/15/2019, 9:16 AM

## 2019-06-15 NOTE — Consult Note (Signed)
   [facility] CM Inpatient Consult   06/15/2019  Patty [name] [birth year] [id number]   [facility] Patient: Medicare NextGen ACO  Patient screened for high risk score for unplanned readmission with less than 30 days readmission hospitalization.    Review of patient's medical record reveals patient is for home with home health and also remains active with [facility] for post hospital home palliative program.   Plan:  Patient care management needs is to be met at this level in the [facility] program for transition of care.  For questions contact:   Natividad Brood, RN BSN [facility] Liaison  [phone] business mobile phone Toll free office [phone]  Fax number: [phone] [name]

## 2019-06-15 NOTE — Progress Notes (Addendum)
Progress Note  Patient Name: Patty [name] Date of Encounter: 06/15/2019  Primary Cardiologist: Larae Grooms, MD   Subjective   Denies any CP or SOB ; was initially concerned about blood pressure this morning, but blood pressure rechecked after taking BP meds was much better.  Inpatient Medications    Scheduled Meds: . atorvastatin  80 mg Oral Daily  . dofetilide  125 mcg Oral BID  . furosemide  80 mg Oral BID  . gabapentin  300 mg Oral BID  . isosorbide mononitrate  90 mg Oral Daily  . magnesium oxide  800 mg Oral Daily  . metoprolol  200 mg Oral Daily  . potassium chloride SA  40 mEq Oral BID  . rivaroxaban  20 mg Oral Q supper   Continuous Infusions:  PRN Meds: acetaminophen **OR** acetaminophen, nitroGLYCERIN, ondansetron (ZOFRAN) IV   Vital Signs    Vitals:   06/14/19 1015 06/14/19 1400 06/14/19 2021 06/15/19 0654  BP: (!) 144/54 (!) 122/94 (!) 141/57 (!) 154/58  Pulse:  77 76 70  Resp:  19 20 17   Temp:  98.1 F (36.7 C) 98 F (36.7 C) 98.2 F (36.8 C)  TempSrc:  Oral Oral Oral  SpO2:   95% 92%  Weight:    76.2 kg  Height:        Intake/Output Summary (Last 24 hours) at 06/15/2019 0806 Last data filed at 06/15/2019 0025 Gross per 24 hour  Intake 240 ml  Output 650 ml  Net -410 ml   Last 3 Weights 06/15/2019 06/14/2019 06/13/2019  Weight (lbs) 167 lb 14.4 oz 168 lb 3.2 oz 171 lb 9.6 oz  Weight (kg) 76.159 kg 76.295 kg 77.837 kg      Telemetry    Paced rhythm, no sign of atrial flutter - Personally Reviewed  ECG    Paced rhythm - Personally Reviewed  Physical Exam   GEN: No acute distress.  Looks comfortable. Neck: No JVD Cardiac: RRR-normal S1-split S2, 2/6 SEM at RUSB.  No rubs, or gallops.  Respiratory: Clear to auscultation bilaterally.  Nonlabored.  Good air movement. GI: Soft, nontender, non-distended  MS: No edema; No deformity.  Spider veins noted. Neuro:  Nonfocal  Psych: Normal affect   Labs    High Sensitivity Troponin:    Recent Labs  Lab 05/28/19 0228 05/28/19 0453 05/28/19 0650 06/08/19 2358 06/09/19 0238  TROPONINIHS 61* 73* 98* 237* 244*      Chemistry Recent Labs  Lab 06/13/19 0714 06/13/19 0738 06/14/19 0252  NA 145 145 146*  K 3.5 3.7 4.0  CL 100 99 102  CO2 40* 41* 36*  GLUCOSE 100* 102* 112*  BUN 17 17 20   CREATININE 0.90 0.94 0.85  CALCIUM 8.5* 8.6* 8.5*  GFRNONAA 58* 55* >60  GFRAA >60 >60 >60  ANIONGAP 5 5 8      Hematology Recent Labs  Lab 06/08/19 2358  WBC 5.9  RBC 4.16  HGB 12.8  HCT 41.4  MCV 99.5  MCH 30.8  MCHC 30.9  RDW 14.6  PLT 309    BNP Recent Labs  Lab 06/09/19 0011  BNP 1,092.6*     DDimer No results for input(s): DDIMER in the last 168 hours.   Radiology    No results found.  Cardiac Studies   Cath 11/10/2018  Ost LAD to Mid LAD lesion is 20% stenosed.  Mid LAD lesion is 30% stenosed.  Ost 2nd Mrg to 2nd Mrg lesion is 60% stenosed.  Ost 1st Mrg  to 1st Mrg lesion is 100% stenosed.  Ost RPDA to RPDA lesion is 50% stenosed.  Ost Cx to Prox Cx lesion is 40% stenosed.  Prox Cx to Mid Cx lesion is 20% stenosed.  1st Mrg lesion is 100% stenosed.  Previously placed Dist Cx stent (unknown type) is widely patent.  2nd Mrg lesion is 90% stenosed.  There is severe left ventricular systolic dysfunction.  LV end diastolic pressure is mildly elevated.  The left ventricular ejection fraction is less than 25% by visual estimate.  Ost RCA to Mid RCA lesion is 40% stenosed.  Mid RCA to Dist RCA lesion is 20% stenosed.  1. Underlying three-vessel coronary artery disease with widely patent stents in the LAD, left circumflex and right coronary arteries. OM1 is occluded with collaterals. OM 2 is diffusely diseased and small. There is moderate restenosis in the ostial left circumflex stent. Mild to moderate restenosis in the RCA stents. Ostial left circumflex stents appears to have significant restenosis at the ostium in certain  views but not in other views. I reviewed previous cardiac catheterization and the appearance does not appear significantly different. 2. Severely reduced LV systolic function with an EF of 20 to 25% with mildly elevated left ventricular end-diastolic pressure.  Recommendations: The patient was in A. fib with RVR during the procedure which made it difficult. She was given 2 doses of IV metoprolol. I suspect that her elevated troponin is likely due to supply demand ischemia in the setting of worsening cardiomyopathy and A. fib with RVR. Recommend EP consultation to manage atrial fibrillation. Resume heparin 8 hours after sheath pull. I discontinued diltiazem given degree of cardiomyopathy. Overall prognosis is poor given age and multiple comorbidities.   Echo 05/29/2019 1. Left ventricular ejection fraction, by estimation, is 40 to 45%. The  left ventricle has moderately decreased function. The left ventricle  demonstrates regional wall motion abnormalities (see scoring  diagram/findings for description). There is severe  left ventricular hypertrophy. Left ventricular diastolic parameters are  indeterminate.  2. Right ventricular systolic function is mildly reduced. The right  ventricular size is normal.  3. Left atrial size was moderately dilated.  4. The mitral valve is degenerative. Trivial mitral valve regurgitation.  5. The aortic valve is abnormal. Aortic valve regurgitation is mild. Mild  aortic valve sclerosis is present, with no evidence of aortic valve  stenosis.  6. The inferior vena cava is normal in size with greater than 50%  respiratory variability, suggesting right atrial pressure of 3 mmHg.    Patient Profile     86 y.o. female with PMH of atrial flutter, chronic combined systolic and diastolic CHF, CAD, CKD stage III, HTN, HLD, h/o tachy-brady s/p St Jude PPM, and granulosa cell carcinoma (discontinued care, on palliative care) presented with SOB and  recurrent aflutter  Assessment & Plan    1. Atrial flutter/atrial fibrillation             - INR supratherapeutic, coumadin switched to Xarelto             - on Tykosyn per EP and metoprolol succinate             - s/p DCCV 06/10/2019, have recurrent atrial flutter, underwent DCCV again on 5/11             - continue metoprolol succinate 200mg  daily.  - maintaining paced rhythm, no sign of recurrent atrial flutter, ok to D/C from cardiac perspective.   2. Acute on chronic combined systolic  and diastolic CHF             - Echo EF 40-45%             - euvolemic. On lasix 80mg  lasix AM and possible additional 40mg  PM at home. She is current on 80mg  BID PO lasix. Will defer to MD to see if she can go back on previous home dose of lasix -->   Would simply return to her normal home dosing for the sake of ease.Marland Kitchen  Her home dose of Lasix was 2 tabs of 40 mg Lasix in the morning daily with an additional 1-2 tabs of Lasix 40 mg tablets based on weight and edema/symptoms in the afternoon.  3. CAD:             - continue plavix - per Dr. Ellyn Hack, defer to primary cardiologist on when to stop             - mild troponin elevation likely demand ischemia related to CHF and afib              - last cath 2020, plan for medical therapy   4. CKD stage III-stable creatinine  5. HTN: stable; okay to tolerate blood pressure 130-150 mmHg provided she is not having breathing issues.  6. HLD: on lipitor  7. H/o tachy-brady s/p St Jude PPM -> currently A sense and V paced  8. Granulosa cell carcinoma - she opted out of treatment; has opted to discontinue treatment now essentially on palliative care/hospice.      For questions or updates, please contact [place] Please consult [email or web address] for contact info under        Signed, Almyra Deforest, [place]  06/15/2019, 8:06 AM     ATTENDING ATTESTATION  I have seen, examined and evaluated the patient this AM on rounds along with Almyra Deforest, PA.  After  reviewing all the available data and chart, we discussed the patients laboratory, study & physical findings as well as symptoms in detail. I agree with his findings, examination as well as impression recommendations as per our discussion.    Attending adjustments noted in italics.  She looks great today.  I think this is time for her to be discharged.  Lets hope that she is to maintain sinus rhythm after the second cardioversion.  She has close follow-up set up with Oda Kilts, PA in the A. fib/EP clinic already scheduled--see AVS.  But also need close follow-up with Dr. Irish Lack or APP.     [facility] will sign off.   Medication Recommendations: No significant changes; Would simply put her back on her home dose of Lasix without changing previous dosing instructions.  (Nonemergent she uses 40 mg tablets as opposed to 80 tablets) Other recommendations (labs, testing, etc): None for now Follow up as an outpatient: See AVS      Glenetta Hew, M.D., M.S. Interventional Cardiologist   Pager # [phone] Phone # [phone] [address] [place], [place] [address]

## 2019-06-15 NOTE — Discharge Summary (Signed)
Physician Discharge Summary  Patty [name] [id number] DOB: 1930-07-03 DOA: 06/08/2019  PCP: [name], MD  Admit date: 06/08/2019 Discharge date: 06/15/2019  Admitted From: Home Disposition: Home  Recommendations for Outpatient Follow-up:  1. Follow up with PCP in 1-2 weeks 2. Please obtain BMP/CBC in one week your next doctors visit.  3. Resume home Lasix twice daily.  Can additionally take 40 mg of oral Lasix if necessary.  Instructions have been provided by cardiology team 4. Outpatient follow-up cardiology per their service.  Home Health: PT/OT/aide/RN Equipment/Devices: Shower chair Discharge Condition: Stable CODE STATUS: DNR Diet recommendation: Heart healthy  Brief/Interim Summary: 86 y.o. female with medical history significant of chronic atrial fibrillation on warfarin, tachycardia-bradycardia syndrome, pacemaker placement, chronic combined systolic and diastolic heart failure, history of MI, CAD with history of multiple stents, hypertension, renal artery stenosis, diverticulosis, diverticulitis, granulosa cell carcinoma, hyperlipidemia, LBBB, obesity, OSA on CPAP, ovarian cancer who is coming to the emergency department due to nonradiated, central chest pressure for the past 2 days associated with a week history of progressively worse fatigue, dyspnea and lower extremity edema.  Initially admitted for CHF exacerbation but due to abnormal rhythm underwent cardioversion on 5/7 but was back in A. fib again on 5/10 requiring repeat cardioversion on 5/11.  She remained in normal sinus rhythm thereafter.  During the hospitalization she became euvolemic and was stable for discharge.  PT recommended home health therefore arrangements were made.  Acute on Chronic combined diastolic and systolic CHF exacerbation: EF 40-45% Today patient appears to be euvolemic.  Saturating greater than 90%.  Denies any shortness of breath.  Medication changes per cardiology team. Resume home regimen  of Lasix twice daily.  Can take additional 40 mg of Lasix if necessary.  Chest pain/severe CAD/elevated troponin: The patient is chest pain-free.  Outpatient follow-up with cardiology.  Continue Plavix.   A. fib/a flutter with RVR; now in normal sinus rhythm Sick sinus syndrome status post [employer] pacemaker. Continue home medication as prescribed by EP/cardiology.  Status post cardioversion 5/7 and 5/11.   Coumadin has been changed to Xarelto.  Essential hypertension: Resume home medications   Hyperlipidemia: cont Lipitor.   Ovarian cancer-on palliative care.  Goals of care: DNR, patient is followed by hospice/palliative care on outpatient basis.   Discharge Diagnoses:  Principal Problem:   ACS (acute coronary syndrome) ([place]) Active Problems:   CAD (coronary artery disease)   Essential hypertension   Hyperlipidemia   OSA on CPAP   Acute on chronic combined systolic (congestive) and diastolic (congestive) heart failure ([name])   Atrial fibrillation, chronic ([place])    Consultations:  Cardiology  Subjective: Feels okay wanting to go home during my evaluation.  Remains in normal sinus rhythm.  Discharge Exam: Vitals:   06/15/19 0809 06/15/19 0838  BP:  114/72  Pulse: 71 80  Resp: 18   Temp: 97.8 F (36.6 C)   SpO2: 94%    Vitals:   06/14/19 2021 06/15/19 0654 06/15/19 0809 06/15/19 0838  BP: (!) 141/57 (!) 154/58  114/72  Pulse: 76 70 71 80  Resp: 20 17 18    Temp: 98 F (36.7 C) 98.2 F (36.8 C) 97.8 F (36.6 C)   TempSrc: Oral Oral Oral   SpO2: 95% 92% 94%   Weight:  76.2 kg    Height:        General: Pt is alert, awake, not in acute distress, elderly frail. Cardiovascular: RRR, S1/S2 +, no rubs, no gallops Respiratory: CTA bilaterally,  no wheezing, no rhonchi Abdominal: Soft, NT, ND, bowel sounds + Extremities: no edema, no cyanosis  Discharge Instructions   Allergies as of 06/15/2019      Reactions   Bee Venom Anaphylaxis   Other Nausea And  Vomiting, Other (See Comments)   Pain medications cause severe vomiting. Tolerated slow IV morphine drip   Oxycodone Hcl Nausea And Vomiting   Amiodarone Nausea Only   Chlorhexidine    Prednisone Palpitations, Other (See Comments)   "Rapid Heart Beat"      Medication List    STOP taking these medications   lidocaine-prilocaine cream Commonly known as: EMLA   warfarin 2 MG tablet Commonly known as: COUMADIN     TAKE these medications   acetaminophen 500 MG tablet Commonly known as: TYLENOL Take 500 mg by mouth every 6 (six) hours as needed for mild pain or headache.   atorvastatin 80 MG tablet Commonly known as: LIPITOR TAKE 1 TABLET BY MOUTH EVERY DAY IN THE MORNING What changed: See the new instructions.   beta carotene w/minerals tablet Take 1 tablet by mouth 2 (two) times daily.   clopidogrel 75 MG tablet Commonly known as: PLAVIX TAKE 1 TABLET BY MOUTH EVERY DAY   Coenzyme Q-10 100 MG capsule Take 200 mg by mouth at bedtime.   dofetilide 125 MCG capsule Commonly known as: TIKOSYN Take 1 capsule (125 mcg total) by mouth 2 (two) times daily.   furosemide 40 MG tablet Commonly known as: LASIX Take 2 tablets by mouth every morning and 2 additional tabs as needed for wt gain/abdominal distension What changed:   how much to take  how to take this  when to take this   gabapentin 300 MG capsule Commonly known as: NEURONTIN TAKE 1 CAPSULE BY MOUTH TWICE A DAY   isosorbide mononitrate 60 MG 24 hr tablet Commonly known as: IMDUR Take 1.5 tablets (90 mg total) by mouth daily.   magnesium oxide 400 (241.3 Mg) MG tablet Commonly known as: MAG-OX Take 2 tablets (800 mg total) by mouth daily.   metoprolol 200 MG 24 hr tablet Commonly known as: TOPROL-XL Take 1 tablet (200 mg total) by mouth daily. Take with or immediately following a meal.   nitroGLYCERIN 0.4 MG SL tablet Commonly known as: NITROSTAT PLACE 1 TABLET (0.4 MG TOTAL) UNDER THE TONGUE EVERY 5  (FIVE) MINUTES AS NEEDED FOR CHEST PAIN What changed:   how much to take  how to take this  when to take this  reasons to take this  additional instructions   potassium chloride SA 20 MEQ tablet Commonly known as: KLOR-CON Take 1 tablet (20 mEq total) by mouth 2 (two) times daily.   rivaroxaban 20 MG Tabs tablet Commonly known as: XARELTO Take 1 tablet (20 mg total) by mouth daily with supper.            Durable Medical Equipment  (From admission, onward)         Start     Ordered   06/15/19 1010  For home use only DME Shower stool  Once     06/15/19 1009         Follow-up Information    [facility] up on 06/22/2019.   Specialty: Cardiology Why: at 0930 for post hospital follow up Contact information: [id number] [place], [facility] Follow up.   Specialty: Home Health Services Why: Registered Nurse,  Physical Therapy, Occupational Therapy, Aide- office to call with start time.  Contact information: [address] [place] [address] [phone]        [facility] Follow up.   Why: Shower Chair Contact information: [address] [place] [address] [phone]        [name], MD. Schedule an appointment as soon as possible for a visit in 1 week(s).   Specialty: Internal Medicine Contact information: [address] [place] [address] [phone]        [name], MD .   Specialties: Cardiology, Radiology, Interventional Cardiology Contact information: [id number] [place] [address] [phone]        Thompson Grayer, MD .   Specialty: Cardiology Contact information: [address] [place] [address] [phone]          Allergies  Allergen Reactions  . Bee Venom Anaphylaxis  . Other Nausea And Vomiting and Other (See  Comments)    Pain medications cause severe vomiting. Tolerated slow IV morphine drip  . Oxycodone Hcl Nausea And Vomiting  . Amiodarone Nausea Only  . Chlorhexidine   . Prednisone Palpitations and Other (See Comments)    "Rapid Heart Beat"    You were cared for by a hospitalist during your hospital stay. If you have any questions about your discharge medications or the care you received while you were in the hospital after you are discharged, you can call the unit and asked to speak with the hospitalist on call if the hospitalist that took care of you is not available. Once you are discharged, your primary care physician will handle any further medical issues. Please note that no refills for any discharge medications will be authorized once you are discharged, as it is imperative that you return to your primary care physician (or establish a relationship with a primary care physician if you do not have one) for your aftercare needs so that they can reassess your need for medications and monitor your lab values.   Procedures/Studies: DG Chest 2 View  Result Date: 06/09/2019 CLINICAL DATA:  86 year old female with chest pain EXAM: CHEST - 2 VIEW COMPARISON:  Chest radiograph dated 05/30/2019. FINDINGS: There is cardiomegaly. Small bilateral pleural effusions. Bibasilar densities may represent atelectasis or infiltrate. No pneumothorax. Left pectoral pacemaker device. Right chest wall Port-A-Cath in similar position. No acute osseous pathology. IMPRESSION: Small bilateral pleural effusions and bibasilar atelectasis/infiltrate. Electronically Signed   By: Anner Crete M.D.   On: 06/09/2019 00:37   DG Chest 2 View  Result Date: 05/30/2019 CLINICAL DATA:  Pneumonia with shortness of breath EXAM: CHEST - 2 VIEW COMPARISON:  Yesterday FINDINGS: Improved aeration at the right base. Interlobular septal thickening and trace pleural effusions. There is a dual-chamber pacer from the left in unremarkable  position. Implantable loop recorder. Port on the right with tip at the SVC. Cardiomegaly. Aortic and coronary atherosclerosis. IMPRESSION: 1. Improved right lower lobe aeration. 2. Cardiomegaly and mild interstitial edema. Electronically Signed   By: Monte Fantasia M.D.   On: 05/30/2019 07:13   DG Chest 2 View  Result Date: 05/29/2019 CLINICAL DATA:  Chest pain. EXAM: CHEST - 2 VIEW COMPARISON:  May 28, 2019 FINDINGS: New opacity in the right base. There may be a small associated effusion. Left retrocardiac opacity remains. Stable cardiomegaly. Stable right Port-A-Cath and pacemaker. No pneumothorax. No other abnormalities. IMPRESSION: 1. New opacity in the  right base may represent pneumonia or aspiration. Given the associated effusion, compressive atelectasis is possible. Recommend clinical correlation. Stable left retrocardiac opacity. Electronically Signed   By: Dorise Bullion III M.D   On: 05/29/2019 13:32   DG Chest 2 View  Result Date: 05/28/2019 CLINICAL DATA:  Chest pain. EXAM: CHEST - 2 VIEW COMPARISON:  March 08, 2019 FINDINGS: The there is stable right-sided venous Port-A-Cath positioning. A dual lead AICD is noted. Mild atelectasis and/or infiltrate is seen within the retrocardiac region of the left lung base. There is no evidence of a pleural effusion or pneumothorax. The cardiac silhouette is markedly enlarged and unchanged in size. There is marked severity calcification of the aortic arch. The visualized skeletal structures are unremarkable. IMPRESSION: Mild left basilar atelectasis and/or infiltrate. Electronically Signed   By: Virgina Norfolk M.D.   On: 05/28/2019 01:43   ECHOCARDIOGRAM LIMITED  Result Date: 05/29/2019    ECHOCARDIOGRAM LIMITED REPORT   Patient Name:   JA CARPENTIER Macleod Date of Exam: 05/29/2019 Medical Rec #:  [id number]       Height:       60.0 in Accession #:    [id number]      Weight:       173.2 lb Date of Birth:  29-Mar-1930       BSA:          1.756 m Patient  Age:    86 years        BP:           122/56 mmHg Patient Gender: F               HR:           84 bpm. Exam Location:  Inpatient Procedure: Limited Echo, Cardiac Doppler, Color Doppler and Intracardiac            Opacification Agent                                MODIFIED REPORT:  This report was modified by Cherlynn Kaiser MD on 05/29/2019 due to no changes                                      made.  Indications:     Chest Pain 786.50/ R07.9  History:         Patient has prior history of Echocardiogram examinations, most                  recent 11/12/2018. CAD, Pacemaker, Arrythmias:Atrial                  Fibrillation, Atrial Flutter and non-specific ST changes,                  Signs/Symptoms:Chest Pain; Risk Factors:Hypertension,                  Dyslipidemia and Former Smoker. DOE.  Sonographer:     Vickie Epley RDCS Referring Phys:  [id number] Elouise Munroe Diagnosing Phys: Cherlynn Kaiser MD IMPRESSIONS  1. Left ventricular ejection fraction, by estimation, is 40 to 45%. The left ventricle has moderately decreased function. The left ventricle demonstrates regional wall motion abnormalities (see scoring diagram/findings for description). There is severe left ventricular hypertrophy. Left ventricular diastolic parameters are indeterminate.  2. Right ventricular systolic function is mildly reduced.  The right ventricular size is normal.  3. Left atrial size was moderately dilated.  4. The mitral valve is degenerative. Trivial mitral valve regurgitation.  5. The aortic valve is abnormal. Aortic valve regurgitation is mild. Mild aortic valve sclerosis is present, with no evidence of aortic valve stenosis.  6. The inferior vena cava is normal in size with greater than 50% respiratory variability, suggesting right atrial pressure of 3 mmHg. Comparison(s): A prior study was performed on 11/12/2018. No significant change from prior study. FINDINGS  Left Ventricle: Left ventricular ejection fraction, by estimation, is 40  to 45%. The left ventricle has moderately decreased function. The left ventricle demonstrates regional wall motion abnormalities. Definity contrast agent was given IV to delineate the left ventricular endocardial borders. There is severe left ventricular hypertrophy. Abnormal (paradoxical) septal motion, consistent with left bundle branch block.  LV Wall Scoring: The apical septal segment is akinetic. The mid inferoseptal segment is hypokinetic. Right Ventricle: The right ventricular size is normal. Right ventricular systolic function is mildly reduced. Left Atrium: Left atrial size was moderately dilated. Mitral Valve: The mitral valve is degenerative in appearance. Mild to moderate mitral annular calcification. Trivial mitral valve regurgitation. Aortic Valve: The aortic valve is abnormal. Aortic valve regurgitation is mild. Mild aortic valve sclerosis is present, with no evidence of aortic valve stenosis. There is moderate calcification of the aortic valve. Aortic valve mean gradient measures 6.6 mmHg. Aortic valve peak gradient measures 14.2 mmHg. Aortic valve area, by VTI measures 1.56 cm. Pulmonic Valve: The pulmonic valve was normal in structure. Pulmonic valve regurgitation is trivial. Venous: The inferior vena cava is normal in size with greater than 50% respiratory variability, suggesting right atrial pressure of 3 mmHg.  LEFT VENTRICLE PLAX 2D LVIDd:         4.20 cm LVIDs:         3.30 cm LV PW:         1.60 cm LV IVS:        1.60 cm LVOT diam:     1.90 cm LV SV:         54 LV SV Index:   31 LVOT Area:     2.84 cm  LEFT ATRIUM         Index LA diam:    4.90 cm 2.79 cm/m  AORTIC VALVE AV Area (Vmax):    1.47 cm AV Area (Vmean):   1.61 cm AV Area (VTI):     1.56 cm AV Vmax:           188.48 cm/s AV Vmean:          116.860 cm/s AV VTI:            0.349 m AV Peak Grad:      14.2 mmHg AV Mean Grad:      6.6 mmHg LVOT Vmax:         97.90 cm/s LVOT Vmean:        66.300 cm/s LVOT VTI:          0.192 m  LVOT/AV VTI ratio: 0.55  AORTA Ao Root diam: 3.00 cm  SHUNTS Systemic VTI:  0.19 m Systemic Diam: 1.90 cm Cherlynn Kaiser MD Electronically signed by Cherlynn Kaiser MD Signature Date/Time: 05/29/2019/12:00:41 PM    Final (Updated)       The results of significant diagnostics from this hospitalization (including imaging, microbiology, ancillary and laboratory) are listed below for reference.     Microbiology: Recent Results (from the past  240 hour(s))  Respiratory Panel by RT PCR (Flu A&B, Covid) - Nasopharyngeal Swab     Status: None   Collection Time: 06/09/19  2:38 AM   Specimen: Nasopharyngeal Swab  Result Value Ref Range Status   SARS Coronavirus 2 by RT PCR NEGATIVE NEGATIVE Final    Comment: (NOTE) SARS-CoV-2 target nucleic acids are NOT DETECTED. The SARS-CoV-2 RNA is generally detectable in upper respiratoy specimens during the acute phase of infection. The lowest concentration of SARS-CoV-2 viral copies this assay can detect is 131 copies/mL. A negative result does not preclude SARS-Cov-2 infection and should not be used as the sole basis for treatment or other patient management decisions. A negative result may occur with  improper specimen collection/handling, submission of specimen other than nasopharyngeal swab, presence of viral mutation(s) within the areas targeted by this assay, and inadequate number of viral copies (<131 copies/mL). A negative result must be combined with clinical observations, patient history, and epidemiological information. The expected result is Negative. Fact Sheet for Patients:  PinkCheek.be Fact Sheet for Healthcare Providers:  GravelBags.it This test is not yet ap proved or cleared by the [place] FDA and  has been authorized for detection and/or diagnosis of SARS-CoV-2 by FDA under an Emergency Use Authorization (EUA). This EUA will remain  in effect (meaning this test can be  used) for the duration of the COVID-19 declaration under Section 564(b)(1) of the Act, 21 U.S.C. section 360bbb-3(b)(1), unless the authorization is terminated or revoked sooner.    Influenza A by PCR NEGATIVE NEGATIVE Final   Influenza B by PCR NEGATIVE NEGATIVE Final    Comment: (NOTE) The Xpert Xpress SARS-CoV-2/FLU/RSV assay is intended as an aid in  the diagnosis of influenza from Nasopharyngeal swab specimens and  should not be used as a sole basis for treatment. Nasal washings and  aspirates are unacceptable for Xpert Xpress SARS-CoV-2/FLU/RSV  testing. Fact Sheet for Patients: PinkCheek.be Fact Sheet for Healthcare Providers: GravelBags.it This test is not yet approved or cleared by the [place] FDA and  has been authorized for detection and/or diagnosis of SARS-CoV-2 by  FDA under an Emergency Use Authorization (EUA). This EUA will remain  in effect (meaning this test can be used) for the duration of the  Covid-19 declaration under Section 564(b)(1) of the Act, 21  U.S.C. section 360bbb-3(b)(1), unless the authorization is  terminated or revoked. Performed at [facility], [place] [address], [place], [place] [address]      Labs: BNP (last 3 results) Recent Labs    11/09/18 1041 05/28/19 0453 06/09/19 0011  BNP 664.9* 1,018.0* 123456*   Basic Metabolic Panel: Recent Labs  Lab 06/11/19 0544 06/12/19 0338 06/13/19 0714 06/13/19 0738 06/14/19 0252 06/15/19 0454  NA 145 146* 145 145 146*  --   K 4.1 4.4 3.5 3.7 4.0  --   CL 103 100 100 99 102  --   CO2 33* 38* 40* 41* 36*  --   GLUCOSE 98 105* 100* 102* 112*  --   BUN 16 17 17 17 20   --   CREATININE 0.94 0.98 0.90 0.94 0.85  --   CALCIUM 8.4* 8.6* 8.5* 8.6* 8.5*  --   MG 2.2 2.1 2.0  --  2.0 1.9   Liver Function Tests: No results for input(s): AST, ALT, ALKPHOS, BILITOT, PROT, ALBUMIN in the last 168 hours. No results for input(s): LIPASE,  AMYLASE in the last 168 hours. No results for input(s): AMMONIA in the last 168  hours. CBC: Recent Labs  Lab 06/08/19 2358  WBC 5.9  HGB 12.8  HCT 41.4  MCV 99.5  PLT 309   Cardiac Enzymes: No results for input(s): CKTOTAL, CKMB, CKMBINDEX, TROPONINI in the last 168 hours. BNP: Invalid input(s): POCBNP CBG: No results for input(s): GLUCAP in the last 168 hours. D-Dimer No results for input(s): DDIMER in the last 72 hours. Hgb A1c No results for input(s): HGBA1C in the last 72 hours. Lipid Profile No results for input(s): CHOL, HDL, LDLCALC, TRIG, CHOLHDL, LDLDIRECT in the last 72 hours. Thyroid function studies No results for input(s): TSH, T4TOTAL, T3FREE, THYROIDAB in the last 72 hours.  Invalid input(s): FREET3 Anemia work up No results for input(s): VITAMINB12, FOLATE, FERRITIN, TIBC, IRON, RETICCTPCT in the last 72 hours. Urinalysis    Component Value Date/Time   COLORURINE YELLOW 10/12/2017 0916   APPEARANCEUR CLEAR 10/12/2017 0916   LABSPEC >1.046 (H) 10/12/2017 0916   PHURINE 6.0 10/12/2017 0916   GLUCOSEU NEGATIVE 10/12/2017 0916   HGBUR NEGATIVE 10/12/2017 0916   BILIRUBINUR NEGATIVE 10/12/2017 0916   KETONESUR NEGATIVE 10/12/2017 0916   PROTEINUR NEGATIVE 10/12/2017 0916   UROBILINOGEN 0.2 12/11/2013 1119   NITRITE NEGATIVE 10/12/2017 0916   LEUKOCYTESUR TRACE (A) 10/12/2017 0916   Sepsis Labs Invalid input(s): PROCALCITONIN,  WBC,  LACTICIDVEN Microbiology Recent Results (from the past 240 hour(s))  Respiratory Panel by RT PCR (Flu A&B, Covid) - Nasopharyngeal Swab     Status: None   Collection Time: 06/09/19  2:38 AM   Specimen: Nasopharyngeal Swab  Result Value Ref Range Status   SARS Coronavirus 2 by RT PCR NEGATIVE NEGATIVE Final    Comment: (NOTE) SARS-CoV-2 target nucleic acids are NOT DETECTED. The SARS-CoV-2 RNA is generally detectable in upper respiratoy specimens during the acute phase of infection. The lowest concentration of  SARS-CoV-2 viral copies this assay can detect is 131 copies/mL. A negative result does not preclude SARS-Cov-2 infection and should not be used as the sole basis for treatment or other patient management decisions. A negative result may occur with  improper specimen collection/handling, submission of specimen other than nasopharyngeal swab, presence of viral mutation(s) within the areas targeted by this assay, and inadequate number of viral copies (<131 copies/mL). A negative result must be combined with clinical observations, patient history, and epidemiological information. The expected result is Negative. Fact Sheet for Patients:  PinkCheek.be Fact Sheet for Healthcare Providers:  GravelBags.it This test is not yet ap proved or cleared by the [place] FDA and  has been authorized for detection and/or diagnosis of SARS-CoV-2 by FDA under an Emergency Use Authorization (EUA). This EUA will remain  in effect (meaning this test can be used) for the duration of the COVID-19 declaration under Section 564(b)(1) of the Act, 21 U.S.C. section 360bbb-3(b)(1), unless the authorization is terminated or revoked sooner.    Influenza A by PCR NEGATIVE NEGATIVE Final   Influenza B by PCR NEGATIVE NEGATIVE Final    Comment: (NOTE) The Xpert Xpress SARS-CoV-2/FLU/RSV assay is intended as an aid in  the diagnosis of influenza from Nasopharyngeal swab specimens and  should not be used as a sole basis for treatment. Nasal washings and  aspirates are unacceptable for Xpert Xpress SARS-CoV-2/FLU/RSV  testing. Fact Sheet for Patients: PinkCheek.be Fact Sheet for Healthcare Providers: GravelBags.it This test is not yet approved or cleared by the [place] FDA and  has been authorized for detection and/or diagnosis of SARS-CoV-2 by  FDA under an Emergency Use Authorization (EUA).  This EUA will remain  in effect (meaning this test can be used) for the duration of the  Covid-19 declaration under Section 564(b)(1) of the Act, 21  U.S.C. section 360bbb-3(b)(1), unless the authorization is  terminated or revoked. Performed at [facility], [place] [address], [place], [place] [address]      Time coordinating discharge:  I have spent 35 minutes face to face with the patient and on the ward discussing the patients care, assessment, plan and disposition with other care givers. >50% of the time was devoted counseling the patient about the risks and benefits of treatment/Discharge disposition and coordinating care.   SIGNED:   Damita Lack, MD  [facility] 06/15/2019, 1:47 PM   If 7PM-7AM, please contact night-coverage

## 2019-06-15 NOTE — Plan of Care (Signed)
  Problem: Activity: Goal: Risk for activity intolerance will decrease Outcome: Adequate for Discharge   Problem: Education: Goal: Knowledge of General Education information will improve Description: Including pain rating scale, medication(s)/side effects and non-pharmacologic comfort measures Outcome: Adequate for Discharge   Problem: Health Behavior/Discharge Planning: Goal: Ability to manage health-related needs will improve Outcome: Adequate for Discharge   Problem: Clinical Measurements: Goal: Ability to maintain clinical measurements within normal limits will improve Outcome: Adequate for Discharge Goal: Will remain free from infection Outcome: Adequate for Discharge Goal: Diagnostic test results will improve Outcome: Adequate for Discharge Goal: Respiratory complications will improve Outcome: Adequate for Discharge Goal: Cardiovascular complication will be avoided Outcome: Adequate for Discharge   Problem: Activity: Goal: Risk for activity intolerance will decrease Outcome: Adequate for Discharge   Problem: Nutrition: Goal: Adequate nutrition will be maintained Outcome: Adequate for Discharge   Problem: Coping: Goal: Level of anxiety will decrease Outcome: Adequate for Discharge   Problem: Elimination: Goal: Will not experience complications related to bowel motility Outcome: Adequate for Discharge Goal: Will not experience complications related to urinary retention Outcome: Adequate for Discharge   Problem: Pain Managment: Goal: General experience of comfort will improve Outcome: Adequate for Discharge   Problem: Safety: Goal: Ability to remain free from injury will improve Outcome: Adequate for Discharge   Problem: Skin Integrity: Goal: Risk for impaired skin integrity will decrease Outcome: Adequate for Discharge   Problem: Education: Goal: Knowledge of disease or condition will improve Outcome: Adequate for Discharge Goal: Understanding of  medication regimen will improve Outcome: Adequate for Discharge Goal: Individualized Educational Video(s) Outcome: Adequate for Discharge   Problem: Activity: Goal: Ability to tolerate increased activity will improve Outcome: Adequate for Discharge   Problem: Cardiac: Goal: Ability to achieve and maintain adequate cardiopulmonary perfusion will improve Outcome: Adequate for Discharge   Problem: Health Behavior/Discharge Planning: Goal: Ability to safely manage health-related needs after discharge will improve Outcome: Adequate for Discharge

## 2019-06-15 NOTE — TOC Transition Note (Signed)
Transition of Care [facility]) - CM/SW Discharge Note   Patient Details  Name: Patty [name] MRN: [id number] Date of Birth: Jan 04, 1931  Transition of Care [facility]) CM/SW Contact:  Bethena Roys, RN Phone Number: 06/15/2019, 10:26 AM   Clinical Narrative: Patient will transition home with home health [facility]- delay for PT until next Monday. RN to see earlier- patient agreeable to services with the delay. [employer] to deliver the durable medical equipment-shower stool. Case Manager did speak with [facility] to make them aware that patient will transition home today for outpatient palliative services. Patient has transportation home via granddaughter. No further needs from Case Manager at this time.    Final next level of care: [place] Barriers to Discharge: No Barriers Identified   Patient Goals and CMS Choice Patient states their goals for this hospitalization and ongoing recovery are:: To go home with help CMS Medicare.gov Compare Post Acute Care list provided to:: Patient Choice offered to / list presented to : Patient   Discharge Plan and Services In-house Referral: NA Discharge Planning Services: CM Consult Post Acute Care Choice: Home Health(Palliative services via [facility])          DME Arranged: Shower stool DME Agency: [employer] Date DME Agency Contacted: 06/15/19 Time DME Agency Contacted: [id number]   [place]: RN, Disease Management, PT, Nurse's Aide, OT HH Agency: [facility] (formerly [employer]) Date [place]: 06/15/19 Time [place]: 0930 Representative spoke with at [place]: Quentin   Readmission Risk Interventions Readmission Risk Prevention Plan 06/10/2019 05/31/2019  Transportation Screening Complete Complete  [place] or [place] Complete -  Social Work Consult for [place] Planning/Counseling Complete -  Palliative Care Screening Complete -  Medication  Review Press photographer) Complete Complete  PCP or Specialist appointment within 3-5 days of discharge - Complete  [place] or [place] - Complete  SW Recovery Care/Counseling Consult - Complete  [place] - Not Applicable  Some recent data might be hidden

## 2019-06-15 NOTE — Progress Notes (Signed)
PT Cancellation Note  Patient Details Name: Patty [name] MRN: [id number] DOB: [birth year]   Cancelled Treatment:    Reason Eval/Treat Not Completed: Other (comment)(Wants to save her energy per pt.)   Patty [name] 06/15/2019, 10:48 AM Patty [name],PT Acute Rehabilitation Services Pager:  [phone]  Office:  [phone]

## 2019-06-17 ENCOUNTER — Telehealth: Payer: Self-pay | Admitting: Interventional Cardiology

## 2019-06-17 NOTE — Telephone Encounter (Signed)
I spoke to the patient who was switched from Warfarin to Xarelto 20 mg Daily at her recent hospital stay.  She was d/c on Wednesday 5/12 and on Wednesday night experienced an episode of diarrhea while walking through her house and had trouble breathing.  Her stool was "soft" on Monday and Tuesday, while in the hospital and she did report it to the nurse.    On Thursday morning she woke up in urine (soaked) and has not had a bowel movement Thursday or yet this morning.  She did not take her Xarelto on Thursday evening and would like further advisement.  She wanted [place] to know that she has felt extremely tired and fatigued, new to her.

## 2019-06-17 NOTE — Telephone Encounter (Signed)
Pt c/o medication issue:  1. Name of Medication: rivaroxaban (XARELTO) 20 MG TABS tablet  2. How are you currently taking this medication (dosage and times per day)? As directed  3. Are you having a reaction (difficulty breathing--STAT)? Yes  4. What is your medication issue? Patient states the medication has caused fatigue and uncontrolled diarrhea. She began to experience the diarrhea on the evening of 06/15/19. She states she has not experienced additional symptoms. Please call to discuss.

## 2019-06-17 NOTE — Telephone Encounter (Signed)
Patient calling back stating she is returning a call.

## 2019-06-17 NOTE — Telephone Encounter (Signed)
I spoke to the patient and gave her Patty [name]'s recommendation.  She verbalized understanding and will f/u.

## 2019-06-17 NOTE — Telephone Encounter (Signed)
Very unlikely these symptoms are due to Xarelto which is not known to cause fatigue or diarrhea.  However patient underwent cardioversion 3 days ago so it is extremely important for her to restart her Xarelto as her risk of stroke is very high right now.  Due to patient's history of diverticulitis and cancer, recommend she contact her GI or oncology office for advice

## 2019-06-18 DIAGNOSIS — Z950 Presence of cardiac pacemaker: Secondary | ICD-10-CM | POA: Diagnosis not present

## 2019-06-18 DIAGNOSIS — I4892 Unspecified atrial flutter: Secondary | ICD-10-CM | POA: Diagnosis not present

## 2019-06-18 DIAGNOSIS — I482 Chronic atrial fibrillation, unspecified: Secondary | ICD-10-CM | POA: Diagnosis not present

## 2019-06-18 DIAGNOSIS — I130 Hypertensive heart and chronic kidney disease with heart failure and stage 1 through stage 4 chronic kidney disease, or unspecified chronic kidney disease: Secondary | ICD-10-CM | POA: Diagnosis not present

## 2019-06-18 DIAGNOSIS — Z6831 Body mass index (BMI) 31.0-31.9, adult: Secondary | ICD-10-CM | POA: Diagnosis not present

## 2019-06-18 DIAGNOSIS — I447 Left bundle-branch block, unspecified: Secondary | ICD-10-CM | POA: Diagnosis not present

## 2019-06-18 DIAGNOSIS — E669 Obesity, unspecified: Secondary | ICD-10-CM | POA: Diagnosis not present

## 2019-06-18 DIAGNOSIS — I2510 Atherosclerotic heart disease of native coronary artery without angina pectoris: Secondary | ICD-10-CM | POA: Diagnosis not present

## 2019-06-18 DIAGNOSIS — Z8543 Personal history of malignant neoplasm of ovary: Secondary | ICD-10-CM | POA: Diagnosis not present

## 2019-06-18 DIAGNOSIS — N183 Chronic kidney disease, stage 3 unspecified: Secondary | ICD-10-CM | POA: Diagnosis not present

## 2019-06-18 DIAGNOSIS — I701 Atherosclerosis of renal artery: Secondary | ICD-10-CM | POA: Diagnosis not present

## 2019-06-18 DIAGNOSIS — I083 Combined rheumatic disorders of mitral, aortic and tricuspid valves: Secondary | ICD-10-CM | POA: Diagnosis not present

## 2019-06-18 DIAGNOSIS — K5790 Diverticulosis of intestine, part unspecified, without perforation or abscess without bleeding: Secondary | ICD-10-CM | POA: Diagnosis not present

## 2019-06-18 DIAGNOSIS — G4733 Obstructive sleep apnea (adult) (pediatric): Secondary | ICD-10-CM | POA: Diagnosis not present

## 2019-06-18 DIAGNOSIS — Z7901 Long term (current) use of anticoagulants: Secondary | ICD-10-CM | POA: Diagnosis not present

## 2019-06-18 DIAGNOSIS — I5043 Acute on chronic combined systolic (congestive) and diastolic (congestive) heart failure: Secondary | ICD-10-CM | POA: Diagnosis not present

## 2019-06-18 DIAGNOSIS — E785 Hyperlipidemia, unspecified: Secondary | ICD-10-CM | POA: Diagnosis not present

## 2019-06-18 DIAGNOSIS — I252 Old myocardial infarction: Secondary | ICD-10-CM | POA: Diagnosis not present

## 2019-06-20 DIAGNOSIS — I482 Chronic atrial fibrillation, unspecified: Secondary | ICD-10-CM | POA: Diagnosis not present

## 2019-06-20 DIAGNOSIS — I4892 Unspecified atrial flutter: Secondary | ICD-10-CM | POA: Diagnosis not present

## 2019-06-20 DIAGNOSIS — I130 Hypertensive heart and chronic kidney disease with heart failure and stage 1 through stage 4 chronic kidney disease, or unspecified chronic kidney disease: Secondary | ICD-10-CM | POA: Diagnosis not present

## 2019-06-20 DIAGNOSIS — I5043 Acute on chronic combined systolic (congestive) and diastolic (congestive) heart failure: Secondary | ICD-10-CM | POA: Diagnosis not present

## 2019-06-20 DIAGNOSIS — E785 Hyperlipidemia, unspecified: Secondary | ICD-10-CM | POA: Diagnosis not present

## 2019-06-20 DIAGNOSIS — N183 Chronic kidney disease, stage 3 unspecified: Secondary | ICD-10-CM | POA: Diagnosis not present

## 2019-06-21 DIAGNOSIS — Z950 Presence of cardiac pacemaker: Secondary | ICD-10-CM | POA: Diagnosis not present

## 2019-06-21 DIAGNOSIS — C562 Malignant neoplasm of left ovary: Secondary | ICD-10-CM | POA: Diagnosis not present

## 2019-06-21 DIAGNOSIS — I480 Paroxysmal atrial fibrillation: Secondary | ICD-10-CM | POA: Diagnosis not present

## 2019-06-21 DIAGNOSIS — I5042 Chronic combined systolic (congestive) and diastolic (congestive) heart failure: Secondary | ICD-10-CM | POA: Diagnosis not present

## 2019-06-21 DIAGNOSIS — I2510 Atherosclerotic heart disease of native coronary artery without angina pectoris: Secondary | ICD-10-CM | POA: Diagnosis not present

## 2019-06-21 DIAGNOSIS — Z7901 Long term (current) use of anticoagulants: Secondary | ICD-10-CM | POA: Diagnosis not present

## 2019-06-22 ENCOUNTER — Other Ambulatory Visit (HOSPITAL_COMMUNITY): Payer: Self-pay

## 2019-06-22 ENCOUNTER — Encounter (HOSPITAL_COMMUNITY): Payer: Medicare Other | Admitting: Nurse Practitioner

## 2019-06-22 ENCOUNTER — Other Ambulatory Visit: Payer: Self-pay

## 2019-06-22 ENCOUNTER — Encounter (HOSPITAL_COMMUNITY): Payer: Self-pay | Admitting: Nurse Practitioner

## 2019-06-22 ENCOUNTER — Ambulatory Visit (HOSPITAL_COMMUNITY)
Admission: RE | Admit: 2019-06-22 | Discharge: 2019-06-22 | Disposition: A | Discharge status: Home / Self Care | Payer: Medicare Other | Source: Ambulatory Visit | Attending: Nurse Practitioner | Admitting: Nurse Practitioner

## 2019-06-22 VITALS — BP 160/60 | HR 67 | Ht 60.0 in | Wt 173.2 lb

## 2019-06-22 DIAGNOSIS — E785 Hyperlipidemia, unspecified: Secondary | ICD-10-CM | POA: Insufficient documentation

## 2019-06-22 DIAGNOSIS — Z7901 Long term (current) use of anticoagulants: Secondary | ICD-10-CM | POA: Diagnosis not present

## 2019-06-22 DIAGNOSIS — D6869 Other thrombophilia: Secondary | ICD-10-CM

## 2019-06-22 DIAGNOSIS — Z885 Allergy status to narcotic agent status: Secondary | ICD-10-CM | POA: Insufficient documentation

## 2019-06-22 DIAGNOSIS — I252 Old myocardial infarction: Secondary | ICD-10-CM | POA: Diagnosis not present

## 2019-06-22 DIAGNOSIS — N183 Chronic kidney disease, stage 3 unspecified: Secondary | ICD-10-CM | POA: Insufficient documentation

## 2019-06-22 DIAGNOSIS — Z79899 Other long term (current) drug therapy: Secondary | ICD-10-CM | POA: Diagnosis not present

## 2019-06-22 DIAGNOSIS — I4892 Unspecified atrial flutter: Secondary | ICD-10-CM | POA: Diagnosis not present

## 2019-06-22 DIAGNOSIS — I5040 Unspecified combined systolic (congestive) and diastolic (congestive) heart failure: Secondary | ICD-10-CM | POA: Insufficient documentation

## 2019-06-22 DIAGNOSIS — Z8543 Personal history of malignant neoplasm of ovary: Secondary | ICD-10-CM | POA: Diagnosis not present

## 2019-06-22 DIAGNOSIS — I2510 Atherosclerotic heart disease of native coronary artery without angina pectoris: Secondary | ICD-10-CM | POA: Insufficient documentation

## 2019-06-22 DIAGNOSIS — I130 Hypertensive heart and chronic kidney disease with heart failure and stage 1 through stage 4 chronic kidney disease, or unspecified chronic kidney disease: Secondary | ICD-10-CM | POA: Insufficient documentation

## 2019-06-22 DIAGNOSIS — I495 Sick sinus syndrome: Secondary | ICD-10-CM | POA: Insufficient documentation

## 2019-06-22 DIAGNOSIS — Z955 Presence of coronary angioplasty implant and graft: Secondary | ICD-10-CM | POA: Diagnosis not present

## 2019-06-22 DIAGNOSIS — I4821 Permanent atrial fibrillation: Secondary | ICD-10-CM | POA: Insufficient documentation

## 2019-06-22 DIAGNOSIS — Z888 Allergy status to other drugs, medicaments and biological substances status: Secondary | ICD-10-CM | POA: Insufficient documentation

## 2019-06-22 DIAGNOSIS — G4733 Obstructive sleep apnea (adult) (pediatric): Secondary | ICD-10-CM | POA: Diagnosis not present

## 2019-06-22 DIAGNOSIS — Z7902 Long term (current) use of antithrombotics/antiplatelets: Secondary | ICD-10-CM | POA: Insufficient documentation

## 2019-06-22 DIAGNOSIS — I447 Left bundle-branch block, unspecified: Secondary | ICD-10-CM | POA: Diagnosis not present

## 2019-06-22 DIAGNOSIS — Z87891 Personal history of nicotine dependence: Secondary | ICD-10-CM | POA: Diagnosis not present

## 2019-06-22 DIAGNOSIS — Z950 Presence of cardiac pacemaker: Secondary | ICD-10-CM | POA: Diagnosis not present

## 2019-06-22 DIAGNOSIS — I739 Peripheral vascular disease, unspecified: Secondary | ICD-10-CM | POA: Insufficient documentation

## 2019-06-22 LAB — CBC
HCT: 38.4 % (ref 36.0–46.0)
Hemoglobin: 12 g/dL (ref 12.0–15.0)
MCH: 31.6 pg (ref 26.0–34.0)
MCHC: 31.3 g/dL (ref 30.0–36.0)
MCV: 101.1 fL — ABNORMAL HIGH (ref 80.0–100.0)
Platelets: 185 10*3/uL (ref 150–400)
RBC: 3.8 MIL/uL — ABNORMAL LOW (ref 3.87–5.11)
RDW: 14.9 % (ref 11.5–15.5)
WBC: 5.3 10*3/uL (ref 4.0–10.5)
nRBC: 0 % (ref 0.0–0.2)

## 2019-06-22 LAB — BASIC METABOLIC PANEL
Anion gap: 7 (ref 5–15)
BUN: 16 mg/dL (ref 8–23)
CO2: 32 mmol/L (ref 22–32)
Calcium: 8.5 mg/dL — ABNORMAL LOW (ref 8.9–10.3)
Chloride: 103 mmol/L (ref 98–111)
Creatinine, Ser: 1.04 mg/dL — ABNORMAL HIGH (ref 0.44–1.00)
GFR calc Af Amer: 56 mL/min — ABNORMAL LOW (ref >60)
GFR calc non Af Amer: 49 mL/min — ABNORMAL LOW (ref >60)
Glucose, Bld: 96 mg/dL (ref 70–99)
Potassium: 3.8 mmol/L (ref 3.5–5.1)
Sodium: 142 mmol/L (ref 135–145)

## 2019-06-22 LAB — MAGNESIUM: Magnesium: 2 mg/dL (ref 1.7–2.4)

## 2019-06-22 MED ORDER — MAGNESIUM OXIDE 400 (241.3 MG) MG PO TABS: 800.0000 mg | ORAL_TABLET | Freq: Every day | ORAL | 6 refills | Status: DC

## 2019-06-22 NOTE — Progress Notes (Signed)
Primary Care Physician: Patty Carol, MD Referring Physician: Dr. Aurea [name] E [name] is a 86 y.o. female with a h/o permanent afib, CAD, HTN, HLD, LBBB, tachy-brady with w/ PPM, OSA, that presented to [name] with afib with RVR, and HF symptoms.  She was diuresed, LHC with stable disease, NSTEMI thought to be 2/2 demand ischemia forn RVR.CCB was stopped and she was transitioned to BB for worsening EF to 40-45%. Her RVR persisted despite aggressive rate control and it was decided to start tikosyn. DCCV restored SR, but had ERAF/atial tach.   In the afib clinic today she feels improved. Fluid status stable, ekg appears to be Sinus rhythm( a paced). Taking tikosyn appropriately. Continues on warfarin with a CHA2DS2VASc score of  at least 6. She hopes to spend Thanksgiving to Christmas with her daughter/son-in-law in the [place] area.   F/u in afib clinic, 10/29.  She called as she started having some chest discomfort and felt the need to take a NTG x 4 in the last 48 hours. Report sent to device clinic yesterday showed atrial flutter. She is not having chest pain today. Discussed with Dr. [name] and he suggested to try to pace terminate. Patty [name] with Patty [name] was asked to come to clinic to  do this.. It was not successful. He felt the pt was in an atrial tachycardia at atrial rate of 200 bpm. He discussed with Dr. [name] and it was decided to mode change from DDD to DDI 2/2 atrial undersensing which ultimately lead to ventricular pacing at 120 bpm( this is thought to have triggered her chest discomfort as replicated in the office today). Her lower rate was reduced to 40 BPM, which her intrinsic presently is 90 bpm.   F/u clinic 12/06/18. She is interrogated today and remains in atrial tach with v rate in the nineties. She  does not seem to be having as high of v rates as last week with 85% of time v rate in the 90's. Atrial rate around 200 bpm. She still reports some chest pressure in the am. She will  rest and take a NTG s/l and will feel a need to take another one around bedtime. She continues with her busy daytime routine in between.  I can not correlate  if chest pressure is  with higher v rates.   F/u in afib clinic, 02/15/19. She just got back from spending 6 weeks with her daughter and son in law in [place] over the holidays. She had a very good visit. She  only had to take 2 NTG while there. She has  not had any heart racing in a while.  EKG today show a paced rhythm. Continues on warfarin with a CHA2DS2VASc score of at least 6.   F/u in afib clinic, 5/19. She has had 2 recent hospitalizations. The first one was for shortness of breath and chest discomfort. Her meds were tweaked. Then in just a few days she went back in with similar symptoms and was out of rhythm. She  Had 2 cardioversion but ultimately was d/c in rhythm. On f/u here she remains in [place] and feels much improved. She is a paced today. Her warfarin was changed to xarelto 20 mg daily. Continues on dofetilide  125  mcg bid.   Today, she denies symptoms of palpitations, chest pain, shortness of breath, orthopnea, PND, lower extremity edema, dizziness, presyncope, syncope, or neurologic sequela. The patient is tolerating medications without difficulties and is otherwise without complaint today.  Past Medical History:  Diagnosis Date  . Bleeding behind the abdominal cavity 10/2017  . Chronic anticoagulation - coumadin, CHADS2VASC=6 05/17/2015  . CKD (chronic kidney disease), stage III   . Combined systolic and diastolic heart failure ([place])   . Coronary artery disease    a. s/p multiple stents - stenting x 2 to the LAD and x 1 to the LCx in 2000, rotational arthrectomy to proximal LCx and DES to LAD in 2014 and DES to LCx in 2014, along with DES to LAD for re-in-stent stenosis in 2015, and DES to ostial LAD in 2016. b. 07/2016 - orbital atherectomy & DES to mid LCx.  . Diverticulitis   . Granulosa cell carcinoma ([place])    abd; last  episode was in 2009  . Hyperlipidemia   . Hypertension   . LBBB (left bundle branch block)   . Myocardial infarction ([place]) 2002  . Obesity   . OSA on CPAP   . Ovarian ca ([place]) 2019  . Pacemaker failure    a. Prior leadless PPM with premature battery failure, being managed conservatively without replacement.  . Peripheral vascular disease ([place])   . Permanent atrial fibrillation ([place]) 2013  . Renal artery stenosis ([place])   . Tachycardia-bradycardia syndrome ([place])    a. s/p leadless pacemaker ([name]) implanted by Dr [name]  . Varicose veins    Past Surgical History:  Procedure Laterality Date  . ABDOMINAL HYSTERECTOMY    . CARDIAC CATHETERIZATION  09/03/2007   EF 70%; Failed attempt at PCI to OM  . CARDIAC CATHETERIZATION  11/01/2003   EF 70%  . CARDIAC CATHETERIZATION N/A 12/14/2015   Procedure: Left Heart Cath and Coronary Angiography;  Surgeon: Patty Blanks, MD;  Location: [place] CV LAB;  Service: Cardiovascular;  Laterality: N/A;  . CARDIOVERSION  12/31/2011   Procedure: CARDIOVERSION;  Surgeon: Patty Booze, MD;  Location: [facility] ENDOSCOPY;  Service: Cardiovascular;  Laterality: N/A;  . CARDIOVERSION N/A 12/31/2011   Procedure: CARDIOVERSION;  Surgeon: Patty Booze, MD;  Location: [facility] CATH LAB;  Service: Cardiovascular;  Laterality: N/A;  . CARDIOVERSION N/A 11/15/2018   Procedure: CARDIOVERSION;  Surgeon: [place]ty Spark, MD;  Location: [facility] ENDOSCOPY;  Service: Cardiovascular;  Laterality: N/A;  . CARDIOVERSION N/A 06/10/2019   Procedure: CARDIOVERSION;  Surgeon: Patty Records, MD;  Location: [facility] ENDOSCOPY;  Service: Cardiovascular;  Laterality: N/A;  . CARDIOVERSION N/A 06/14/2019   Procedure: CARDIOVERSION;  Surgeon: Patty Casino, MD;  Location: [place];  Service: Cardiovascular;  Laterality: N/A;  . CATARACT EXTRACTION, BILATERAL  2015  . CHOLECYSTECTOMY  1980's  . COLON SURGERY  2004   colectomy for diverticulosis  . CORONARY ANGIOPLASTY  WITH STENT PLACEMENT  2000    and 08/11/2012; 11/12/2012: 3 + 2 LAD & CFX; 2nd CFX stent 11/12/2012  . CORONARY ATHERECTOMY N/A 07/15/2016   Procedure: Coronary Atherectomy;  Surgeon: Martinique, Peter M, MD;  Location: [place] CV LAB;  Service: Cardiovascular;  Laterality: N/A;  . CORONARY BALLOON ANGIOPLASTY N/A 07/14/2016   Procedure: Coronary Balloon Angioplasty;  Surgeon: Martinique, Peter M, MD;  Location: [place] CV LAB;  Service: Cardiovascular;  Laterality: N/A;  . CORONARY STENT INTERVENTION N/A 07/15/2016   Procedure: Coronary Stent Intervention;  Surgeon: Martinique, Peter M, MD;  Location: [place] CV LAB;  Service: Cardiovascular;  Laterality: N/A;  . FRACTIONAL FLOW RESERVE WIRE  10/07/2013   Procedure: [place];  Surgeon: Patty Booze, MD;  Location: [facility] CATH LAB;  Service: Cardiovascular;;  . HERNIA REPAIR  2005   "laparoscopic"  . IR IMAGING GUIDED PORT INSERTION  10/28/2017  . LEFT HEART CATH AND CORONARY ANGIOGRAPHY N/A 07/14/2016   Procedure: Left Heart Cath and Coronary Angiography;  Surgeon: [place]ique, Peter M, MD;  Location: [place] CV LAB;  Service: Cardiovascular;  Laterality: N/A;  . LEFT HEART CATH AND CORONARY ANGIOGRAPHY N/A 11/10/2018   Procedure: LEFT HEART CATH AND CORONARY ANGIOGRAPHY;  Surgeon: [place] Hampshire, MD;  Location: [place] [facility];  Service: Cardiovascular;  Laterality: N/A;  . LEFT HEART CATHETERIZATION WITH CORONARY ANGIOGRAM N/A 11/12/2012   Procedure: LEFT HEART CATHETERIZATION WITH CORONARY ANGIOGRAM;  Surgeon: Patty Booze, MD;  Location: [facility] CATH LAB;  Service: Cardiovascular;  Laterality: N/A;  . LEFT HEART CATHETERIZATION WITH CORONARY ANGIOGRAM N/A 10/07/2013   Procedure: LEFT HEART CATHETERIZATION WITH CORONARY ANGIOGRAM;  Surgeon: Patty Booze, MD;  Location: [facility] CATH LAB;  Service: Cardiovascular;  Laterality: N/A;  . LEFT HEART CATHETERIZATION WITH CORONARY ANGIOGRAM N/A 12/14/2013   Procedure: LEFT HEART  CATHETERIZATION WITH CORONARY ANGIOGRAM;  Surgeon: [place] Grooms, MD;  Location: [facility] CATH LAB;  Service: Cardiovascular;  Laterality: N/A;  . LEFT HEART CATHETERIZATION WITH CORONARY ANGIOGRAM N/A 05/16/2014   Procedure: LEFT HEART CATHETERIZATION WITH CORONARY ANGIOGRAM;  Surgeon: Sherren Mocha, MD;  Location: [facility] CATH LAB;  Service: Cardiovascular;  Laterality: N/A;  . PERCUTANEOUS CORONARY INTERVENTION-BALLOON ONLY  08/04/2012   Procedure: PERCUTANEOUS CORONARY INTERVENTION-BALLOON ONLY;  Surgeon: Patty Booze, MD;  Location: [facility] CATH LAB;  Service: Cardiovascular;;  . PERCUTANEOUS CORONARY ROTOBLATOR INTERVENTION (PCI-R) N/A 08/11/2012   Procedure: PERCUTANEOUS CORONARY ROTOBLATOR INTERVENTION (PCI-R);  Surgeon: Patty Booze, MD;  Location: [name] CATH LAB;  Service: Cardiovascular;  Laterality: N/A;  . PERCUTANEOUS CORONARY STENT INTERVENTION (PCI-S)  10/07/2013   Procedure: PERCUTANEOUS CORONARY STENT INTERVENTION (PCI-S);  Surgeon: Patty Booze, MD;  Location: [facility] CATH LAB;  Service: Cardiovascular;;  . PERMANENT PACEMAKER INSERTION N/A 03/16/2012   [name] (SJM) leadless pacemaker (LEADLESS II STUDY PATEINT)  . SALIVARY GLAND SURGERY  2000's   "had a little lump removed; granulosa related; it was benign" (08/11/2012)  . TEE WITHOUT CARDIOVERSION  12/31/2011   Procedure: TRANSESOPHAGEAL ECHOCARDIOGRAM (TEE);  Surgeon: Patty Booze, MD;  Location: [facility] ENDOSCOPY;  Service: Cardiovascular;  Laterality: N/A;  . UMBILICAL GRANULOMA EXCISION  2000   2003; 2004; 2007: "all in my abdomen including small intestines, outside my ?uterus/etc" (08/11/2012)  . VARICOSE VEIN SURGERY Bilateral 1977    Current Outpatient Medications  Medication Sig Dispense Refill  . acetaminophen (TYLENOL) 500 MG tablet Take 500 mg by mouth every 6 (six) hours as needed for mild pain or headache.     Marland Kitchen atorvastatin (LIPITOR) 80 MG tablet TAKE 1 TABLET BY MOUTH EVERY DAY IN THE MORNING (Patient taking  differently: Take 80 mg by mouth daily. ) 90 tablet 3  . beta carotene w/minerals (OCUVITE) tablet Take 1 tablet by mouth 2 (two) times daily.     . clopidogrel (PLAVIX) 75 MG tablet TAKE 1 TABLET BY MOUTH EVERY DAY (Patient taking differently: Take 75 mg by mouth daily. ) 30 tablet 11  . Coenzyme Q-10 100 MG capsule Take 200 mg by mouth at bedtime.     . dofetilide (TIKOSYN) 125 MCG capsule Take 1 capsule (125 mcg total) by mouth 2 (two) times daily. 60 capsule 6  . furosemide (LASIX) 40 MG tablet Take 2 tablets by mouth every morning and 2 additional tabs  as needed for wt gain/abdominal distension (Patient taking differently: Take 40 mg by mouth See admin instructions. Take 2 tablets by mouth every morning and 2 additional tabs as needed for wt gain/abdominal distension ) 180 tablet 2  . gabapentin (NEURONTIN) 300 MG capsule TAKE 1 CAPSULE BY MOUTH TWICE A DAY (Patient taking differently: Take 300 mg by mouth 2 (two) times daily. ) 60 capsule 1  . isosorbide mononitrate (IMDUR) 60 MG 24 hr tablet Take 1.5 tablets (90 mg total) by mouth daily. 45 tablet 3  . magnesium oxide (MAG-OX) 400 (241.3 Mg) MG tablet Take 2 tablets (800 mg total) by mouth daily. 30 tablet 0  . metoprolol (TOPROL-XL) 200 MG 24 hr tablet Take 1 tablet (200 mg total) by mouth daily. Take with or immediately following a meal. 30 tablet 6  . nitroGLYCERIN (NITROSTAT) 0.4 MG SL tablet PLACE 1 TABLET (0.4 MG TOTAL) UNDER THE TONGUE EVERY 5 (FIVE) MINUTES AS NEEDED FOR CHEST PAIN (Patient taking differently: Place 0.4 mg under the tongue every 5 (five) minutes as needed for chest pain. ) 75 tablet 2  . potassium chloride SA (KLOR-CON) 20 MEQ tablet Take 1 tablet (20 mEq total) by mouth 2 (two) times daily. 180 tablet 3  . rivaroxaban (XARELTO) 20 MG TABS tablet Take 1 tablet (20 mg total) by mouth daily with supper. 30 tablet 0   No current facility-administered medications for this encounter.    Allergies  Allergen Reactions  .  Bee Venom Anaphylaxis  . Other Nausea And Vomiting and Other (See Comments)    Pain medications cause severe vomiting. Tolerated slow IV morphine drip  . Oxycodone Hcl Nausea And Vomiting  . Amiodarone Nausea Only  . Chlorhexidine   . Prednisone Palpitations and Other (See Comments)    "Rapid Heart Beat"    Social History   Socioeconomic History  . Marital status: Widowed    Spouse name: Not on file  . Number of children: 4  . Years of education: Not on file  . Highest education level: Not on file  Occupational History  . Occupation: Retired Nurse, learning disability estate  Tobacco Use  . Smoking status: Former Smoker    Packs/day: 1.00    Years: 32.00    Pack years: 32.00    Types: Cigarettes    Quit date: 02/03/1974    Years since quitting: 45.4  . Smokeless tobacco: Never Used  Substance and Sexual Activity  . Alcohol use: Yes    Alcohol/week: 7.0 standard drinks    Types: 7 Glasses of wine per week    Comment: one glass wine nightly with dinner  . Drug use: No  . Sexual activity: Never  Other Topics Concern  . Not on file  Social History Narrative   Lives alone.   Social Determinants of Health   Financial Resource Strain:   . Difficulty of Paying Living Expenses:   Food Insecurity:   . Worried About Charity fundraiser in the Last Year:   . Arboriculturist in the Last Year:   Transportation Needs:   . Film/video editor (Medical):   Marland Kitchen Lack of Transportation (Non-Medical):   Physical Activity:   . Days of Exercise per Week:   . Minutes of Exercise per Session:   Stress:   . Feeling of Stress :   Social Connections:   . Frequency of Communication with Friends and Family:   . Frequency of Social Gatherings with Friends and Family:   .  Attends Religious Services:   . Active Member of Clubs or Organizations:   . Attends Archivist Meetings:   Marland Kitchen Marital Status:   Intimate Partner Violence:   . Fear of Current or Ex-Partner:   . Emotionally Abused:     Marland Kitchen Physically Abused:   . Sexually Abused:     Family History  Problem Relation Age of Onset  . Stroke Mother   . Heart attack Father   . Diabetes Father   . Hypertension Father   . Heart attack Brother   . Diabetes Brother   . Hypertension Brother   . Kidney failure Brother     ROS- All systems are reviewed and negative except as per the HPI above  Physical Exam: Vitals:   06/22/19 1129  BP: (!) 160/60  Pulse: 67  Weight: 78.6 kg  Height: 5' (1.524 m)   Wt Readings from Last 3 Encounters:  06/22/19 78.6 kg  06/15/19 76.2 kg  05/31/19 78.9 kg    Labs: Lab Results  Component Value Date   NA 142 06/22/2019   K 3.8 06/22/2019   CL 103 06/22/2019   CO2 32 06/22/2019   GLUCOSE 96 06/22/2019   BUN 16 06/22/2019   CREATININE 1.04 (H) 06/22/2019   CALCIUM 8.5 (L) 06/22/2019   PHOS 3.7 06/08/2010   MG 2.0 06/22/2019   Lab Results  Component Value Date   INR 3.0 (H) 06/13/2019   Lab Results  Component Value Date   CHOL 112 11/10/2018   HDL 36 (L) 11/10/2018   LDLCALC 62 11/10/2018   TRIG 69 11/10/2018     GEN- The patient is well appearing, alert and oriented x 3 today.   Head- normocephalic, atraumatic Eyes-  Sclera clear, conjunctiva pink Ears- hearing intact Oropharynx- clear Neck- supple, no JVP Lymph- no cervical lymphadenopathy Lungs- Clear to ausculation bilaterally, normal work of breathing Heart- Regular rate and rhythm, no murmurs, rubs or gallops, PMI not laterally displaced GI- soft, NT, ND, + BS Extremities- no clubbing, cyanosis, or edema MS- no significant deformity or atrophy Skin- no rash or lesion Psych- euthymic mood, full affect Neuro- strength and sensation are intact  EKG-  atrial paced at 67 bpm with prolonged AV conduction NSIVB    Assessment and Plan: 1. Atach/afib/aflutter  Appears to be a sensed today Feels well at this time  Continue dofetilide 125 mcg bid Continue metoprolol xl 200 mg a day  Bmet/cbc today   2.  CHF Continue daily weights, weight stable at home per pt  Avoid salt   3. CAD Complex disease  No  current anginal problems   4. CHA2DS2VASc score of at least 6 Continue xarelto 20 mg daily  Recently changed from warfarin   F/u with Dr. Erasmo Leventhal. [facility] as needed     Geroge Baseman. Rafaela Dinius, [facility] [id number] [place], [place] [address] [phone]

## 2019-06-25 DIAGNOSIS — I482 Chronic atrial fibrillation, unspecified: Secondary | ICD-10-CM | POA: Diagnosis not present

## 2019-06-25 DIAGNOSIS — N183 Chronic kidney disease, stage 3 unspecified: Secondary | ICD-10-CM | POA: Diagnosis not present

## 2019-06-25 DIAGNOSIS — I5043 Acute on chronic combined systolic (congestive) and diastolic (congestive) heart failure: Secondary | ICD-10-CM | POA: Diagnosis not present

## 2019-06-25 DIAGNOSIS — E785 Hyperlipidemia, unspecified: Secondary | ICD-10-CM | POA: Diagnosis not present

## 2019-06-25 DIAGNOSIS — I130 Hypertensive heart and chronic kidney disease with heart failure and stage 1 through stage 4 chronic kidney disease, or unspecified chronic kidney disease: Secondary | ICD-10-CM | POA: Diagnosis not present

## 2019-06-25 DIAGNOSIS — I4892 Unspecified atrial flutter: Secondary | ICD-10-CM | POA: Diagnosis not present

## 2019-06-27 ENCOUNTER — Emergency Department (HOSPITAL_COMMUNITY): Payer: Medicare Other

## 2019-06-27 ENCOUNTER — Emergency Department (HOSPITAL_COMMUNITY)
Admission: EM | Admit: 2019-06-27 | Discharge: 2019-06-27 | Disposition: A | Discharge status: Home / Self Care | Payer: Medicare Other | Attending: Emergency Medicine | Admitting: Emergency Medicine

## 2019-06-27 ENCOUNTER — Encounter (HOSPITAL_COMMUNITY): Payer: Self-pay | Admitting: Emergency Medicine

## 2019-06-27 ENCOUNTER — Other Ambulatory Visit: Payer: Self-pay

## 2019-06-27 DIAGNOSIS — R079 Chest pain, unspecified: Secondary | ICD-10-CM | POA: Diagnosis not present

## 2019-06-27 DIAGNOSIS — I4892 Unspecified atrial flutter: Secondary | ICD-10-CM | POA: Diagnosis not present

## 2019-06-27 DIAGNOSIS — R0789 Other chest pain: Secondary | ICD-10-CM | POA: Diagnosis not present

## 2019-06-27 DIAGNOSIS — Z950 Presence of cardiac pacemaker: Secondary | ICD-10-CM | POA: Insufficient documentation

## 2019-06-27 DIAGNOSIS — I2510 Atherosclerotic heart disease of native coronary artery without angina pectoris: Secondary | ICD-10-CM | POA: Diagnosis not present

## 2019-06-27 DIAGNOSIS — Z79899 Other long term (current) drug therapy: Secondary | ICD-10-CM | POA: Diagnosis not present

## 2019-06-27 DIAGNOSIS — Z743 Need for continuous supervision: Secondary | ICD-10-CM | POA: Diagnosis not present

## 2019-06-27 DIAGNOSIS — I208 Other forms of angina pectoris: Secondary | ICD-10-CM

## 2019-06-27 DIAGNOSIS — R0602 Shortness of breath: Secondary | ICD-10-CM | POA: Diagnosis not present

## 2019-06-27 DIAGNOSIS — I5042 Chronic combined systolic (congestive) and diastolic (congestive) heart failure: Secondary | ICD-10-CM

## 2019-06-27 DIAGNOSIS — Z8543 Personal history of malignant neoplasm of ovary: Secondary | ICD-10-CM | POA: Diagnosis not present

## 2019-06-27 DIAGNOSIS — Z955 Presence of coronary angioplasty implant and graft: Secondary | ICD-10-CM | POA: Diagnosis not present

## 2019-06-27 DIAGNOSIS — I447 Left bundle-branch block, unspecified: Secondary | ICD-10-CM | POA: Diagnosis not present

## 2019-06-27 DIAGNOSIS — Z7901 Long term (current) use of anticoagulants: Secondary | ICD-10-CM | POA: Insufficient documentation

## 2019-06-27 DIAGNOSIS — I110 Hypertensive heart disease with heart failure: Secondary | ICD-10-CM | POA: Insufficient documentation

## 2019-06-27 DIAGNOSIS — I214 Non-ST elevation (NSTEMI) myocardial infarction: Secondary | ICD-10-CM

## 2019-06-27 DIAGNOSIS — Z87891 Personal history of nicotine dependence: Secondary | ICD-10-CM | POA: Diagnosis not present

## 2019-06-27 DIAGNOSIS — Z515 Encounter for palliative care: Secondary | ICD-10-CM | POA: Diagnosis not present

## 2019-06-27 DIAGNOSIS — I5040 Unspecified combined systolic (congestive) and diastolic (congestive) heart failure: Secondary | ICD-10-CM | POA: Diagnosis not present

## 2019-06-27 LAB — TROPONIN I (HIGH SENSITIVITY)
Troponin I (High Sensitivity): 36 ng/L — ABNORMAL HIGH (ref <18)
Troponin I (High Sensitivity): 47 ng/L — ABNORMAL HIGH (ref <18)

## 2019-06-27 LAB — CBC
HCT: 39.2 % (ref 36.0–46.0)
Hemoglobin: 12.2 g/dL (ref 12.0–15.0)
MCH: 31.4 pg (ref 26.0–34.0)
MCHC: 31.1 g/dL (ref 30.0–36.0)
MCV: 101 fL — ABNORMAL HIGH (ref 80.0–100.0)
Platelets: 167 10*3/uL (ref 150–400)
RBC: 3.88 MIL/uL (ref 3.87–5.11)
RDW: 15.2 % (ref 11.5–15.5)
WBC: 4.4 10*3/uL (ref 4.0–10.5)
nRBC: 0 % (ref 0.0–0.2)

## 2019-06-27 LAB — BASIC METABOLIC PANEL
Anion gap: 9 (ref 5–15)
BUN: 22 mg/dL (ref 8–23)
CO2: 30 mmol/L (ref 22–32)
Calcium: 8.3 mg/dL — ABNORMAL LOW (ref 8.9–10.3)
Chloride: 101 mmol/L (ref 98–111)
Creatinine, Ser: 1.2 mg/dL — ABNORMAL HIGH (ref 0.44–1.00)
GFR calc Af Amer: 47 mL/min — ABNORMAL LOW (ref >60)
GFR calc non Af Amer: 41 mL/min — ABNORMAL LOW (ref >60)
Glucose, Bld: 112 mg/dL — ABNORMAL HIGH (ref 70–99)
Potassium: 3.6 mmol/L (ref 3.5–5.1)
Sodium: 140 mmol/L (ref 135–145)

## 2019-06-27 MED ORDER — DIGOXIN 125 MCG PO TABS
0.0625 mg | ORAL_TABLET | Freq: Every day | ORAL | Status: DC
  Administered 2019-06-27: 0.0625 mg via ORAL
  Filled 2019-06-27: qty 1

## 2019-06-27 MED ORDER — NITROGLYCERIN 0.4 MG SL SUBL
0.4000 mg | SUBLINGUAL_TABLET | SUBLINGUAL | Status: DC | PRN
  Administered 2019-06-27: 0.4 mg via SUBLINGUAL

## 2019-06-27 MED ORDER — DIGOXIN 62.5 MCG PO TABS: 0.0625 mg | ORAL_TABLET | Freq: Every day | ORAL | 0 refills | Status: DC

## 2019-06-27 MED ORDER — NITROGLYCERIN 0.4 MG SL SUBL
SUBLINGUAL_TABLET | SUBLINGUAL | Status: AC
  Filled 2019-06-27: qty 1

## 2019-06-27 NOTE — ED Triage Notes (Signed)
Pt arrives from home via [facility] with c/o of cp that started around 8am pt took 2 sl nitro tablets at home prior to calling ems- ems gave another nitro tablet en route and 324 mg asa- pt arrives to ED and states CP is currently relived. Pt has 20G in left hand.

## 2019-06-27 NOTE — H&P (Deleted)
Cardiology Admission History and Physical:   Patty [name] ID: PAVITHRA DEBSKI MRN: [id number]; DOB: [birth year]   Admission date: 06/27/2019  Primary Care Provider: [name], MD Primary Cardiologist: Larae Grooms, MD  Primary Electrophysiologist:  Thompson Grayer, MD   Chief Complaint:  Chest pain  Patty [name] Profile:   Patty [name] is a 86 y.o. female with CAD, chronic combined CHF, permanent atrial fibrillation on Coumadin, history of tachy-brady syndrome s/p St Jude PPM (previous leadless pacemaker had premature battery failure and is inactive), CKD stage III, granulosa cell carcinoma, HTN, HLD, PVD and obstructive sleep apnea presented with chest pain and dyspnea  History of Present Illness:   Patty [name] is a 86 year old female with past medical history of CAD, chronic combined CHF, permanent atrial fibrillation on Xarelto, history of tachy-brady syndrome s/p St Jude PPM (previous leadless pacemaker had premature battery failure and is inactive), CKD stage III, granulosa cell carcinoma with involvement of ovary (gave up treatment, focus on quality of life), hypertension, hyperlipidemia, PVD and obstructive sleep apnea.  Patty [name] had multiple cardiac catheterization in the past.  He had 2 stents to the LAD and 1 stent to the left circumflex in 2000.  He underwent DES of left circumflex and stent placement to LAD in 2014.  She underwent DES to LAD for in-stent restenosis in 2015 and another DES to ostial LAD in 2016.  She had orbital atherectomy and drug-eluting stent placement to mid left circumflex in June 2018.  Most recent cardiac catheterization was performed in October 2020 which showed three-vessel disease unchanged when compared to the previous cardiac catheterization, medical therapy was recommended.  Patty [name] was seen by A. fib clinic on 05/26/2019, at which time she was noted to be in atrial flutter.  Device interrogation from early April showed 7% atrial fibrillation burden.  There  has also been some progression of her chronic granulosa cell carcinoma.  She has been taken off of tamoxifen and focused on quality of life and was enrolled in palliative care.  She was admitted for acute on chronic diastolic heart failure in April 2021.  She underwent diuresis and was released from the hospital to resume oral diuretic.  Unfortunately Patty [name] returned on 06/09/2019 with recurrent heart failure and atrial flutter. She had elevated troponin during the hospitalization which was felt to be demand ischemia.  Patty [name] was treated with IV diuretic and subsequently underwent cardioversion on 5/7.  Patty [name] had recurrent atrial flutter and underwent another cardioversion on 5/11. Coumadin was changed to Xarelto during the admission.  She was seen in the A. fib clinic for post hospital follow-up on 5/19, EKG at the time showed she was in atrial paced rhythm without any recurrence of atrial flutter.  Since discharge, Patty [name] has been doing well at home.  She no longer have the chest pressure and significant shortness of breath.  Symptom recurred yesterday on 06/26/2019.  She says by 10 PM, she could feel more chest discomfort than usual.  Her heart rate was also unusually fast around 110.  She woke up around 2 AM this morning with worsening chest pain, shortness of breath, palpitation and fatigue.  She eventually sought medical attention at [facility].  Upon arrival at [facility], ED, she was noted to be in atrial flutter with RVR, average heart rate around 100-110.  High-sensitivity troponin was 36 --> 47.  Creatinine was mildly elevated at 1.2.  Chest x-ray negative for acute process.  Cardiology was asked to evaluate the Patty [name] for chest pain  and shortness of breath in the setting of recurrent atrial flutter.    Past Medical History:  Diagnosis Date  . Bleeding behind the abdominal cavity 10/2017  . Chronic anticoagulation - coumadin, CHADS2VASC=6 05/17/2015  . CKD (chronic kidney disease),  stage III   . Combined systolic and diastolic heart failure ([place])   . Coronary artery disease    a. s/p multiple stents - stenting x 2 to the LAD and x 1 to the LCx in 2000, rotational arthrectomy to proximal LCx and DES to LAD in 2014 and DES to LCx in 2014, along with DES to LAD for re-in-stent stenosis in 2015, and DES to ostial LAD in 2016. b. 07/2016 - orbital atherectomy & DES to mid LCx.  . Diverticulitis   . Granulosa cell carcinoma ([place])    abd; last episode was in 2009  . Hyperlipidemia   . Hypertension   . LBBB (left bundle branch block)   . Myocardial infarction ([place]) 2002  . Obesity   . OSA on CPAP   . Ovarian ca ([place]) 2019  . Pacemaker failure    a. Prior leadless PPM with premature battery failure, being managed conservatively without replacement.  . Peripheral vascular disease ([place])   . Permanent atrial fibrillation ([place]) 2013  . Renal artery stenosis ([place])   . Tachycardia-bradycardia syndrome ([place])    a. s/p leadless pacemaker ([name]) implanted by Dr [name]  . Varicose veins     Past Surgical History:  Procedure Laterality Date  . ABDOMINAL HYSTERECTOMY    . CARDIAC CATHETERIZATION  09/03/2007   EF 70%; Failed attempt at PCI to OM  . CARDIAC CATHETERIZATION  11/01/2003   EF 70%  . CARDIAC CATHETERIZATION N/A 12/14/2015   Procedure: Left Heart Cath and Coronary Angiography;  Surgeon: Burnell Blanks, MD;  Location: [place] CV LAB;  Service: Cardiovascular;  Laterality: N/A;  . CARDIOVERSION  12/31/2011   Procedure: CARDIOVERSION;  Surgeon: [name], MD;  Location: [facility] ENDOSCOPY;  Service: Cardiovascular;  Laterality: N/A;  . CARDIOVERSION N/A 12/31/2011   Procedure: CARDIOVERSION;  Surgeon: [name], MD;  Location: [facility] CATH LAB;  Service: Cardiovascular;  Laterality: N/A;  . CARDIOVERSION N/A 11/15/2018   Procedure: CARDIOVERSION;  Surgeon: Dorothy Spark, MD;  Location: [facility] ENDOSCOPY;  Service: Cardiovascular;  Laterality: N/A;   . CARDIOVERSION N/A 06/10/2019   Procedure: CARDIOVERSION;  Surgeon: Fay Records, MD;  Location: [facility] ENDOSCOPY;  Service: Cardiovascular;  Laterality: N/A;  . CARDIOVERSION N/A 06/14/2019   Procedure: CARDIOVERSION;  Surgeon: Pixie Casino, MD;  Location: [place];  Service: Cardiovascular;  Laterality: N/A;  . CATARACT EXTRACTION, BILATERAL  2015  . CHOLECYSTECTOMY  1980's  . COLON SURGERY  2004   colectomy for diverticulosis  . CORONARY ANGIOPLASTY WITH STENT PLACEMENT  2000    and 08/11/2012; 11/12/2012: 3 + 2 LAD & CFX; 2nd CFX stent 11/12/2012  . CORONARY ATHERECTOMY N/A 07/15/2016   Procedure: Coronary Atherectomy;  Surgeon: Martinique, Peter M, MD;  Location: [place] CV LAB;  Service: Cardiovascular;  Laterality: N/A;  . CORONARY BALLOON ANGIOPLASTY N/A 07/14/2016   Procedure: Coronary Balloon Angioplasty;  Surgeon: Martinique, Peter M, MD;  Location: [place] CV LAB;  Service: Cardiovascular;  Laterality: N/A;  . CORONARY STENT INTERVENTION N/A 07/15/2016   Procedure: Coronary Stent Intervention;  Surgeon: Martinique, Peter M, MD;  Location: [place] CV LAB;  Service: Cardiovascular;  Laterality: N/A;  . FRACTIONAL FLOW RESERVE WIRE  10/07/2013   Procedure: FRACTIONAL  FLOW RESERVE WIRE;  Surgeon: [name], MD;  Location: [facility] CATH LAB;  Service: Cardiovascular;;  . HERNIA REPAIR  2005   "laparoscopic"  . IR IMAGING GUIDED PORT INSERTION  10/28/2017  . LEFT HEART CATH AND CORONARY ANGIOGRAPHY N/A 07/14/2016   Procedure: Left Heart Cath and Coronary Angiography;  Surgeon: Martinique, Peter M, MD;  Location: [place] CV LAB;  Service: Cardiovascular;  Laterality: N/A;  . LEFT HEART CATH AND CORONARY ANGIOGRAPHY N/A 11/10/2018   Procedure: LEFT HEART CATH AND CORONARY ANGIOGRAPHY;  Surgeon: Wellington Hampshire, MD;  Location: [place] CV LAB;  Service: Cardiovascular;  Laterality: N/A;  . LEFT HEART CATHETERIZATION WITH CORONARY ANGIOGRAM N/A 11/12/2012   Procedure: LEFT HEART  CATHETERIZATION WITH CORONARY ANGIOGRAM;  Surgeon: [name], MD;  Location: [facility] CATH LAB;  Service: Cardiovascular;  Laterality: N/A;  . LEFT HEART CATHETERIZATION WITH CORONARY ANGIOGRAM N/A 10/07/2013   Procedure: LEFT HEART CATHETERIZATION WITH CORONARY ANGIOGRAM;  Surgeon: [name], MD;  Location: [facility] CATH LAB;  Service: Cardiovascular;  Laterality: N/A;  . LEFT HEART CATHETERIZATION WITH CORONARY ANGIOGRAM N/A 12/14/2013   Procedure: LEFT HEART CATHETERIZATION WITH CORONARY ANGIOGRAM;  Surgeon: Sinclair Grooms, MD;  Location: [facility] CATH LAB;  Service: Cardiovascular;  Laterality: N/A;  . LEFT HEART CATHETERIZATION WITH CORONARY ANGIOGRAM N/A 05/16/2014   Procedure: LEFT HEART CATHETERIZATION WITH CORONARY ANGIOGRAM;  Surgeon: Sherren Mocha, MD;  Location: [facility] CATH LAB;  Service: Cardiovascular;  Laterality: N/A;  . PERCUTANEOUS CORONARY INTERVENTION-BALLOON ONLY  08/04/2012   Procedure: PERCUTANEOUS CORONARY INTERVENTION-BALLOON ONLY;  Surgeon: [name], MD;  Location: [facility] CATH LAB;  Service: Cardiovascular;;  . PERCUTANEOUS CORONARY ROTOBLATOR INTERVENTION (PCI-R) N/A 08/11/2012   Procedure: PERCUTANEOUS CORONARY ROTOBLATOR INTERVENTION (PCI-R);  Surgeon: [name], MD;  Location: [facility] CATH LAB;  Service: Cardiovascular;  Laterality: N/A;  . PERCUTANEOUS CORONARY STENT INTERVENTION (PCI-S)  10/07/2013   Procedure: PERCUTANEOUS CORONARY STENT INTERVENTION (PCI-S);  Surgeon: [name], MD;  Location: [facility] CATH LAB;  Service: Cardiovascular;;  . PERMANENT PACEMAKER INSERTION N/A 03/16/2012   [name] (SJM) leadless pacemaker (LEADLESS II STUDY PATEINT)  . SALIVARY GLAND SURGERY  2000's   "had a little lump removed; granulosa related; it was benign" (08/11/2012)  . TEE WITHOUT CARDIOVERSION  12/31/2011   Procedure: TRANSESOPHAGEAL ECHOCARDIOGRAM (TEE);  Surgeon: [name], MD;  Location: [facility] ENDOSCOPY;  Service: Cardiovascular;  Laterality: N/A;  .  UMBILICAL GRANULOMA EXCISION  2000   2003; 2004; 2007: "all in my abdomen including small intestines, outside my ?uterus/etc" (08/11/2012)  . VARICOSE VEIN SURGERY Bilateral 1977     Medications Prior to Admission: Prior to Admission medications   Medication Sig Start Date End Date Taking? Authorizing Provider  acetaminophen (TYLENOL) 500 MG tablet Take 500 mg by mouth every 6 (six) hours as needed for mild pain or headache.    Yes [provider]  atorvastatin (LIPITOR) 80 MG tablet TAKE 1 TABLET BY MOUTH EVERY DAY IN THE MORNING Patty [name] taking differently: Take 80 mg by mouth daily.  04/04/19  Yes [name], MD  beta carotene w/minerals (OCUVITE) tablet Take 1 tablet by mouth 2 (two) times daily.    Yes [provider]  clopidogrel (PLAVIX) 75 MG tablet TAKE 1 TABLET BY MOUTH EVERY DAY Patty [name] taking differently: Take 75 mg by mouth daily.  03/15/19  Yes [name], MD  Coenzyme Q-10 100 MG capsule Take 200 mg by mouth at bedtime.    Yes [provider]  dofetilide (TIKOSYN) 125 MCG capsule Take 1 capsule (125 mcg total) by mouth 2 (two) times daily. 12/16/18  Yes Sherran Needs, NP  furosemide (LASIX) 40 MG tablet Take 2 tablets by mouth every morning and 2 additional tabs as needed for wt gain/abdominal distension Patty [name] taking differently: Take 40 mg by mouth See admin instructions. Take 2 tablets by mouth every morning and 2 additional tabs as needed for wt gain/abdominal distension  03/30/19  Yes [name], MD  gabapentin (NEURONTIN) 300 MG capsule TAKE 1 CAPSULE BY MOUTH TWICE A DAY Patty [name] taking differently: Take 300 mg by mouth 2 (two) times daily.  04/25/19  Yes Heath Lark, MD  isosorbide mononitrate (IMDUR) 60 MG 24 hr tablet Take 1.5 tablets (90 mg total) by mouth daily. 05/31/19 09/28/19 Yes Nita Sells, MD  magnesium oxide (MAG-OX) 400 (241.3 Mg) MG tablet Take 2 tablets (800 mg total) by mouth daily. 06/22/19  Yes  Sherran Needs, NP  metoprolol (TOPROL-XL) 200 MG 24 hr tablet Take 1 tablet (200 mg total) by mouth daily. Take with or immediately following a meal. 12/10/18  Yes Sherran Needs, NP  Multiple Vitamin (MULTIVITAMIN) capsule Take 1 capsule by mouth daily.   Yes [provider]  nitroGLYCERIN (NITROSTAT) 0.4 MG SL tablet PLACE 1 TABLET (0.4 MG TOTAL) UNDER THE TONGUE EVERY 5 (FIVE) MINUTES AS NEEDED FOR CHEST PAIN Patty [name] taking differently: Place 0.4 mg under the tongue every 5 (five) minutes as needed for chest pain.  05/26/19  Yes Sherran Needs, NP  potassium chloride SA (KLOR-CON) 20 MEQ tablet Take 1 tablet (20 mEq total) by mouth 2 (two) times daily. 01/21/19  Yes Allred, Jeneen Rinks, MD  rivaroxaban (XARELTO) 20 MG TABS tablet Take 1 tablet (20 mg total) by mouth daily with supper. 06/15/19  Yes Damita Lack, MD     Allergies:    Allergies  Allergen Reactions  . Bee Venom Anaphylaxis  . Other Nausea And Vomiting and Other (See Comments)    Pain medications cause severe vomiting. Tolerated slow IV morphine drip  . Oxycodone Hcl Nausea And Vomiting  . Amiodarone Nausea Only  . Chlorhexidine   . Prednisone Palpitations and Other (See Comments)    "Rapid Heart Beat"    Social History:   Social History   Socioeconomic History  . Marital status: Widowed    Spouse name: Not on file  . Number of children: 4  . Years of education: Not on file  . Highest education level: Not on file  Occupational History  . Occupation: Retired Nurse, learning disability estate  Tobacco Use  . Smoking status: Former Smoker    Packs/day: 1.00    Years: 32.00    Pack years: 32.00    Types: Cigarettes    Quit date: 02/03/1974    Years since quitting: 45.4  . Smokeless tobacco: Never Used  Substance and Sexual Activity  . Alcohol use: Yes    Alcohol/week: 7.0 standard drinks    Types: 7 Glasses of wine per week    Comment: one glass wine nightly with dinner  . Drug use: No  . Sexual activity:  Never  Other Topics Concern  . Not on file  Social History Narrative   Lives alone.   Social Determinants of Health   Financial Resource Strain:   . Difficulty of Paying Living Expenses:   Food Insecurity:   . Worried About Charity fundraiser in the Last Year:   .  Ran Out of Food in the Last Year:   Transportation Needs:   . Film/video editor (Medical):   Marland Kitchen Lack of Transportation (Non-Medical):   Physical Activity:   . Days of Exercise per Week:   . Minutes of Exercise per Session:   Stress:   . Feeling of Stress :   Social Connections:   . Frequency of Communication with Friends and Family:   . Frequency of Social Gatherings with Friends and Family:   . Attends Religious Services:   . Active Member of Clubs or Organizations:   . Attends Archivist Meetings:   Marland Kitchen Marital Status:   Intimate Partner Violence:   . Fear of Current or Ex-Partner:   . Emotionally Abused:   Marland Kitchen Physically Abused:   . Sexually Abused:     Family History:   The Patty [name]'s family history includes Diabetes in her brother and father; Heart attack in her brother and father; Hypertension in her brother and father; Kidney failure in her brother; Stroke in her mother.    ROS:  Please see the history of present illness.  All other ROS reviewed and negative.     Physical Exam/Data:   Vitals:   06/27/19 1300 06/27/19 1458 06/27/19 1500 06/27/19 1554  BP: (!) 156/83 (!) 134/108 100/90   Pulse: (!) 107 (!) 109 94   Resp: 18 (!) 22 16   Temp:      TempSrc:      SpO2: 92% 99% 96%   Weight:    77.7 kg  Height:    5' (1.524 m)   No intake or output data in the 24 hours ending 06/27/19 1617 Last 3 Weights 06/27/2019 06/22/2019 06/15/2019  Weight (lbs) 171 lb 3.2 oz 173 lb 3.2 oz 167 lb 14.4 oz  Weight (kg) 77.656 kg 78.563 kg 76.159 kg     Body mass index is 33.44 kg/m.  General:  Well nourished, well developed, in no acute distress HEENT: normal Lymph: no adenopathy Neck: no  JVD Endocrine:  No thryomegaly Vascular: No carotid bruits; FA pulses 2+ bilaterally without bruits  Cardiac:  normal S1, S2; irregularly irregular; 1+ murmur  Lungs:  clear to auscultation bilaterally, no wheezing, rhonchi or rales  Abd: soft, nontender, no hepatomegaly  Ext: no edema Musculoskeletal:  No deformities, BUE and BLE strength normal and equal Skin: warm and dry  Neuro:  CNs 2-12 intact, no focal abnormalities noted Psych:  Normal affect    EKG:  The ECG that was done in the ED and was personally reviewed and demonstrates atrial flutter with variable AV conduction, LBBB  Relevant CV Studies:  Echo 05/29/2019 1. Left ventricular ejection fraction, by estimation, is 40 to 45%. The  left ventricle has moderately decreased function. The left ventricle  demonstrates regional wall motion abnormalities (see scoring  diagram/findings for description). There is severe  left ventricular hypertrophy. Left ventricular diastolic parameters are  indeterminate.  2. Right ventricular systolic function is mildly reduced. The right  ventricular size is normal.  3. Left atrial size was moderately dilated.  4. The mitral valve is degenerative. Trivial mitral valve regurgitation.  5. The aortic valve is abnormal. Aortic valve regurgitation is mild. Mild  aortic valve sclerosis is present, with no evidence of aortic valve  stenosis.  6. The inferior vena cava is normal in size with greater than 50%  respiratory variability, suggesting right atrial pressure of 3 mmHg.   Laboratory Data:  High Sensitivity Troponin:   Recent Labs  Lab 06/08/19 2358 06/09/19 0238 06/27/19 1151 06/27/19 1343  TROPONINIHS 237* 244* 36* 47*      Chemistry Recent Labs  Lab 06/22/19 1135 06/27/19 1151  NA 142 140  K 3.8 3.6  CL 103 101  CO2 32 30  GLUCOSE 96 112*  BUN 16 22  CREATININE 1.04* 1.20*  CALCIUM 8.5* 8.3*  GFRNONAA 49* 41*  GFRAA 56* 47*  ANIONGAP 7 9    No results for  input(s): PROT, ALBUMIN, AST, ALT, ALKPHOS, BILITOT in the last 168 hours. Hematology Recent Labs  Lab 06/22/19 1135 06/27/19 1151  WBC 5.3 4.4  RBC 3.80* 3.88  HGB 12.0 12.2  HCT 38.4 39.2  MCV 101.1* 101.0*  MCH 31.6 31.4  MCHC 31.3 31.1  RDW 14.9 15.2  PLT 185 167   BNPNo results for input(s): BNP, PROBNP in the last 168 hours.  DDimer No results for input(s): DDIMER in the last 168 hours.   Radiology/Studies:  DG Chest Port 1 View  Result Date: 06/27/2019 CLINICAL DATA:  Chest pain EXAM: PORTABLE CHEST 1 VIEW COMPARISON:  06/09/2019 chest radiograph. FINDINGS: Right internal jugular Port-A-Cath and 2 lead left subclavian pacemaker are stable in configuration. Loop recorder overlies the left heart. Stable cardiomediastinal silhouette with mild cardiomegaly. No pneumothorax. No right pleural effusion. Possible trace left pleural effusion. No overt pulmonary edema. IMPRESSION: 1. Stable mild cardiomegaly without overt pulmonary edema. 2. Possible trace left pleural effusion. Electronically Signed   By: Ilona Sorrel M.D.   On: 06/27/2019 12:05       TIMI Risk Score for Unstable Angina or Non-ST Elevation MI:   The Patty [name]'s TIMI risk score is 5, which indicates a 26% risk of all cause mortality, new or recurrent myocardial infarction or need for urgent revascularization in the next 14 days.   Assessment and Plan:   1. Recurrent atrial flutter: Previous device interrogation in early April showed only 6% A. fib/atrial flutter burden.  Unfortunately, Patty [name] is very symptomatic and tend to have acute heart failure symptoms every time she goes into atrial flutter.  More recently, she underwent DCCV on 5/7 and 06/14/2019.  She returned today for evaluation of chest pain and shortness of breath, she was noted to be in recurrent atrial flutter.  She previously did not tolerate amiodarone.  Currently on Tikosyn 125 mcg twice daily and Toprol-XL 200 mg daily.  Coumadin was switched to Xarelto  during the last hospitalization.  Limited option at this time as Patty [name].   2. Chest pain with mildly elevated troponin: She has chest pain in the past however this does not consistently, every time she has atrial flutter.  Previous cardiac catheterization was in October 2020 at which time, coronary anatomy was unchanged however she does have severe three-vessel disease. Will discuss with MD, unclear if chest pain is related to recurrent atrial flutter.   3. Chronic combined CHF  - baseline EF 40-45% on Echo 05/29/2019  4. CAD: severe multivessel CAD on last cath in 11/2018, no change in anatomy when compared to the previous cath  5. history of tachy-brady syndrome s/p St Jude PPM (previous leadless pacemaker had premature battery failure and is inactive)  6. CKD stage III  7. granulosa cell carcinoma with involvement of ovary (gave up treatment, focus on quality of life)  - not a candidate for aggressive workup  8. Hypertension: stable.   9. Hyperlipidemia   Severity of Illness: The appropriate Patty [name] status for this Patty [name] is OBSERVATION. Observation status is judged  to be reasonable and necessary in order to provide the required intensity of service to ensure the Patty [name]'s safety. The Patty [name]'s presenting symptoms, physical exam findings, and initial radiographic and laboratory data in the context of their medical condition is felt to place them at decreased risk for further clinical deterioration. Furthermore, it is anticipated that the Patty [name] will be medically stable for discharge from the hospital within 2 midnights of admission. The following factors support the Patty [name] status of observation.   " The Patty [name]'s presenting symptoms include chest pain, dyspnea, palpitation. " The physical exam findings include irregular heart beat " The initial radiographic and laboratory data are elevated troponin, recurrent atrial flutter on EKG.     For questions or updates, please contact [place] Please consult [email or web address] for contact info under        Hilbert Corrigan, [place]  06/27/2019 4:17 PM

## 2019-06-27 NOTE — ED Provider Notes (Signed)
  Care assumed from B. Haywood Pao at shift change pending cardiology consult.  See his note for full H&P.   Briefly this is an 86 year old female presenting to emergency department today with chief complaint of chest pain with concern for NSTEMI.  She is a past medical history significant for CAD, hypertension, hyperlipidemia, obesity, A. fib on Xarelto.  EKG shows no significant change compared to prior.  Delta troponin today is 36 and 47.  Patient was evaluated at the bedside by cardiology PA and attending Dr. Martinique.  Dr. Martinique had a long discussion with patient regarding goals of care and symptomatic management.  It was decided that patient will start digoxin and be discharged home with prescription for the same.  They do not feel that admission is necessary as patient has had multiple failed cardioversions in the recent past and she is already connected with palliative care.  Cardiology recommends outpatient follow-up with them in office as well reaching out to palliative care team.  Patient is agreeable with this plan of care.  She will be discharged home in stable condition.  I did discuss strict return precautions that would warrant her to return to the emergency department or call EMS.  ED attending Dr. Darl Householder is also aware of plan with care and personally discussed with cardiologist the plan.  Please see cardiology note.   Portions of this note were generated with Lobbyist. Dictation errors may occur despite best attempts at [place], Colter Magowan E, PA-C 06/27/19 1703    Drenda Freeze, MD 06/27/19 2258

## 2019-06-27 NOTE — Discharge Instructions (Addendum)
Please follow up with your cardiologist and palliative care as recommended by the heart doctor today.  Follow up also with your primary care doctor in 2-5 days if needed.  Return to the emergency department if needed for worsening symptoms.  -You were started on a new medication by cardiology.  This medication is digoxin.  It is to help control your heart rate.  Please take this as prescribed.

## 2019-06-27 NOTE — ED Notes (Signed)
[employer] called and advised that pt has a single chamber leadless pacemaker that has been abandoned and then she had a new dual chamber pacemaker in place.  They state that she is currently in a-flutter (known rhythm for her) with a ventricular response at around 100. Nothing was seen on the pacemaker to explain her current symptoms.

## 2019-06-27 NOTE — ED Provider Notes (Signed)
Patty [name]   CSN: [id number] Arrival date & time: 06/27/19  1120     History Chief Complaint  Patient presents with  . Chest Pain    Patty [name] is a 86 y.o. female.  The history is provided by the patient and medical records. No language interpreter was used.  Chest Pain    86 year old female with history of CAD, hypertension, hyperlipidemia, obesity, atrial fibrillation currently on Xarelto, brought here via EMS from home for evaluation of chest pain.  Patient report yesterday she developed some chest comfort including chest pressure without any significant pain.  This morning the pressure still present but she did develop pain in her chest.  She described as a heaviness achy sensation, with mild shortness of breath and nausea.  She also has some loose stools.  She took 2 sublingual nitro without adequate relief, EMS was contacted and she received additional sublingual nitro and now her pain is completely resolved.  She recalls yesterday her heart rate was fast in the 110s.  She denies any recent medication changes but states that she has been to the hospital at least 3 times a month for similar event.  She denies any fever chills productive cough back pain abdominal pain or radicular pain.  Past Medical History:  Diagnosis Date  . Bleeding behind the abdominal cavity 10/2017  . Chronic anticoagulation - coumadin, CHADS2VASC=6 05/17/2015  . CKD (chronic kidney disease), stage III   . Combined systolic and diastolic heart failure ([place])   . Coronary artery disease    a. s/p multiple stents - stenting x 2 to the LAD and x 1 to the LCx in 2000, rotational arthrectomy to proximal LCx and DES to LAD in 2014 and DES to LCx in 2014, along with DES to LAD for re-in-stent stenosis in 2015, and DES to ostial LAD in 2016. b. 07/2016 - orbital atherectomy & DES to mid LCx.  . Diverticulitis   . Granulosa cell carcinoma ([place])    abd; last  episode was in 2009  . Hyperlipidemia   . Hypertension   . LBBB (left bundle branch block)   . Myocardial infarction ([place]) 2002  . Obesity   . OSA on CPAP   . Ovarian ca ([place]) 2019  . Pacemaker failure    a. Prior leadless PPM with premature battery failure, being managed conservatively without replacement.  . Peripheral vascular disease ([place])   . Permanent atrial fibrillation ([place]) 2013  . Renal artery stenosis ([place])   . Tachycardia-bradycardia syndrome ([place])    a. s/p leadless pacemaker ([name]) implanted by Dr [name]  . Varicose veins     Patient Active Problem List   Diagnosis Date Noted  . ACS (acute coronary syndrome) ([place]) 06/09/2019  . Heart failure ([place]) 05/29/2019  . Atrial fibrillation, chronic ([place]) 05/28/2019  . Stable angina ([place]) 10/09/2018  . Coagulopathy ([place]) 10/09/2018  . Acute on chronic combined systolic and diastolic CHF (congestive heart failure) ([place]) 10/08/2018  . Peripheral neuropathy due to chemotherapy ([place]) 11/19/2017  . Pancytopenia, acquired ([place]) 11/19/2017  . Acute on chronic combined systolic (congestive) and diastolic (congestive) heart failure ([place]) 11/03/2017  . Hyperglycemia 11/03/2017  . Protein-calorie malnutrition, moderate ([place]) 10/28/2017  . Other constipation 10/20/2017  . Postherpetic neuralgia 10/20/2017  . Cancer associated pain 10/20/2017  . Physical debility 10/20/2017  . Goals of care, counseling/discussion 10/20/2017  . Ovarian cancer ([place]) 10/19/2017  . Intraperitoneal hemorrhage   . Abdominal pain 10/12/2017  .  Pain of joint of left ankle and foot 08/03/2017  . Persistent atrial fibrillation   . Warfarin anticoagulation   . DOE (dyspnea on exertion)   . Status post coronary artery stent placement   . Normocytic anemia 07/11/2016  . History of small bowel obstruction 12/22/2015  . Permanent atrial fibrillation with RVR 12/15/2015  . Demand ischemia ([place]) 12/15/2015  . NSTEMI (non-ST elevated myocardial infarction)  ([place]) 05/18/2015  . Chronic anticoagulation - coumadin, CHADS2VASC=6 05/17/2015  . Unstable angina ([place]) 05/17/2015  . Swelling of lower extremity 04/18/2015  . Encounter for therapeutic drug monitoring 08/25/2014  . 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[place]) 06/16/2014  . Ischemic chest pain ([place])   . OSA on CPAP 05/14/2014  . Chest pain 01/05/2014  . Atrial flutter ([place])   . Hypokalemia   . Pacemaker - [employer] Leadless PPM   . Combined systolic and diastolic heart failure, NYHA class 3 ([place])   . Tachycardia-bradycardia syndrome ([place]) 09/30/2012  . Atrial fibrillation with RVR ([place]) 12/28/2011  . Ventral hernia 10/16/2011  . Granulosa cell tumor of ovary 04/23/2011  . CAD (coronary artery disease) 10/03/2010  . Essential hypertension 10/03/2010  . Hyperlipidemia 10/03/2010  . Renal artery stenosis ([place]) 10/03/2010  . Obesity (BMI 30.0-34.9) 10/03/2010    Past Surgical History:  Procedure Laterality Date  . ABDOMINAL HYSTERECTOMY    . CARDIAC CATHETERIZATION  09/03/2007   EF 70%; Failed attempt at PCI to OM  . CARDIAC CATHETERIZATION  11/01/2003   EF 70%  . CARDIAC CATHETERIZATION N/A 12/14/2015   Procedure: Left Heart Cath and Coronary Angiography;  Surgeon: Burnell Blanks, MD;  Location: [facility];  Service: Cardiovascular;  Laterality: N/A;  . CARDIOVERSION  12/31/2011   Procedure: CARDIOVERSION;  Surgeon: [name], MD;  Location: [facility] ENDOSCOPY;  Service: Cardiovascular;  Laterality: N/A;  . CARDIOVERSION N/A 12/31/2011   Procedure: CARDIOVERSION;  Surgeon: [name], MD;  Location: [facility] CATH LAB;  Service: Cardiovascular;  Laterality: N/A;  . CARDIOVERSION N/A 11/15/2018   Procedure: CARDIOVERSION;  Surgeon: Dorothy Spark, MD;  Location: [facility] ENDOSCOPY;  Service: Cardiovascular;  Laterality: N/A;  . CARDIOVERSION N/A 06/10/2019   Procedure: CARDIOVERSION;  Surgeon: Fay Records, MD;  Location: [facility] ENDOSCOPY;  Service: Cardiovascular;   Laterality: N/A;  . CARDIOVERSION N/A 06/14/2019   Procedure: CARDIOVERSION;  Surgeon: Pixie Casino, MD;  Location: [facility];  Service: Cardiovascular;  Laterality: N/A;  . CATARACT EXTRACTION, BILATERAL  2015  . CHOLECYSTECTOMY  1980's  . COLON SURGERY  2004   colectomy for diverticulosis  . CORONARY ANGIOPLASTY WITH STENT PLACEMENT  2000    and 08/11/2012; 11/12/2012: 3 + 2 LAD & CFX; 2nd CFX stent 11/12/2012  . CORONARY ATHERECTOMY N/A 07/15/2016   Procedure: Coronary Atherectomy;  Surgeon: Martinique, Peter M, MD;  Location: [facility];  Service: Cardiovascular;  Laterality: N/A;  . CORONARY BALLOON ANGIOPLASTY N/A 07/14/2016   Procedure: Coronary Balloon Angioplasty;  Surgeon: Martinique, Peter M, MD;  Location: [facility];  Service: Cardiovascular;  Laterality: N/A;  . CORONARY STENT INTERVENTION N/A 07/15/2016   Procedure: Coronary Stent Intervention;  Surgeon: Martinique, Peter M, MD;  Location: [facility] LAB;  Service: Cardiovascular;  Laterality: N/A;  . FRACTIONAL FLOW RESERVE WIRE  10/07/2013   Procedure: [place];  Surgeon: [name], MD;  Location: [facility] CATH LAB;  Service: Cardiovascular;;  . HERNIA REPAIR  2005   "laparoscopic"  . IR  IMAGING GUIDED PORT INSERTION  10/28/2017  . LEFT HEART CATH AND CORONARY ANGIOGRAPHY N/A 07/14/2016   Procedure: Left Heart Cath and Coronary Angiography;  Surgeon: Martinique, Peter M, MD;  Location: [facility] LAB;  Service: Cardiovascular;  Laterality: N/A;  . LEFT HEART CATH AND CORONARY ANGIOGRAPHY N/A 11/10/2018   Procedure: LEFT HEART CATH AND CORONARY ANGIOGRAPHY;  Surgeon: Wellington Hampshire, MD;  Location: [facility];  Service: Cardiovascular;  Laterality: N/A;  . LEFT HEART CATHETERIZATION WITH CORONARY ANGIOGRAM N/A 11/12/2012   Procedure: LEFT HEART CATHETERIZATION WITH CORONARY ANGIOGRAM;  Surgeon: [name], MD;  Location: [facility] CATH LAB;  Service: Cardiovascular;  Laterality: N/A;    . LEFT HEART CATHETERIZATION WITH CORONARY ANGIOGRAM N/A 10/07/2013   Procedure: LEFT HEART CATHETERIZATION WITH CORONARY ANGIOGRAM;  Surgeon: [name], MD;  Location: [facility] CATH LAB;  Service: Cardiovascular;  Laterality: N/A;  . LEFT HEART CATHETERIZATION WITH CORONARY ANGIOGRAM N/A 12/14/2013   Procedure: LEFT HEART CATHETERIZATION WITH CORONARY ANGIOGRAM;  Surgeon: Sinclair Grooms, MD;  Location: [facility] LAB;  Service: Cardiovascular;  Laterality: N/A;  . LEFT HEART CATHETERIZATION WITH CORONARY ANGIOGRAM N/A 05/16/2014   Procedure: LEFT HEART CATHETERIZATION WITH CORONARY ANGIOGRAM;  Surgeon: Sherren Mocha, MD;  Location: [facility] CATH LAB;  Service: Cardiovascular;  Laterality: N/A;  . PERCUTANEOUS CORONARY INTERVENTION-BALLOON ONLY  08/04/2012   Procedure: PERCUTANEOUS CORONARY INTERVENTION-BALLOON ONLY;  Surgeon: [name], MD;  Location: [facility] CATH LAB;  Service: Cardiovascular;;  . PERCUTANEOUS CORONARY ROTOBLATOR INTERVENTION (PCI-R) N/A 08/11/2012   Procedure: PERCUTANEOUS CORONARY ROTOBLATOR INTERVENTION (PCI-R);  Surgeon: [name], MD;  Location: [facility] CATH LAB;  Service: Cardiovascular;  Laterality: N/A;  . PERCUTANEOUS CORONARY STENT INTERVENTION (PCI-S)  10/07/2013   Procedure: PERCUTANEOUS CORONARY STENT INTERVENTION (PCI-S);  Surgeon: [name], MD;  Location: [facility] CATH LAB;  Service: Cardiovascular;;  . PERMANENT PACEMAKER INSERTION N/A 03/16/2012   [name] (SJM) leadless pacemaker (LEADLESS II STUDY PATEINT)  . SALIVARY GLAND SURGERY  2000's   "had a little lump removed; granulosa related; it was benign" (08/11/2012)  . TEE WITHOUT CARDIOVERSION  12/31/2011   Procedure: TRANSESOPHAGEAL ECHOCARDIOGRAM (TEE);  Surgeon: [name], MD;  Location: [facility] ENDOSCOPY;  Service: Cardiovascular;  Laterality: N/A;  . UMBILICAL GRANULOMA EXCISION  2000   2003; 2004; 2007: "all in my abdomen including small intestines, outside my ?uterus/etc" (08/11/2012)  .  VARICOSE VEIN SURGERY Bilateral 1977     OB History   No obstetric history on file.     Family History  Problem Relation Age of Onset  . Stroke Mother   . Heart attack Father   . Diabetes Father   . Hypertension Father   . Heart attack Brother   . Diabetes Brother   . Hypertension Brother   . Kidney failure Brother     Social History   Tobacco Use  . Smoking status: Former Smoker    Packs/day: 1.00    Years: 32.00    Pack years: 32.00    Types: Cigarettes    Quit date: 02/03/1974    Years since quitting: 45.4  . Smokeless tobacco: Never Used  Substance Use Topics  . Alcohol use: Yes    Alcohol/week: 7.0 standard drinks    Types: 7 Glasses of wine per week    Comment: one glass wine nightly with dinner  . Drug use: No    Home Medications Prior to Admission medications   Medication Sig Start Date End Date Taking? Authorizing  Provider  acetaminophen (TYLENOL) 500 MG tablet Take 500 mg by mouth every 6 (six) hours as needed for mild pain or headache.     [provider]  atorvastatin (LIPITOR) 80 MG tablet TAKE 1 TABLET BY MOUTH EVERY DAY IN THE MORNING Patient taking differently: Take 80 mg by mouth daily.  04/04/19   [name], MD  beta carotene w/minerals (OCUVITE) tablet Take 1 tablet by mouth 2 (two) times daily.     [provider]  clopidogrel (PLAVIX) 75 MG tablet TAKE 1 TABLET BY MOUTH EVERY DAY Patient taking differently: Take 75 mg by mouth daily.  03/15/19   [name], MD  Coenzyme Q-10 100 MG capsule Take 200 mg by mouth at bedtime.     [provider]  dofetilide (TIKOSYN) 125 MCG capsule Take 1 capsule (125 mcg total) by mouth 2 (two) times daily. 12/16/18   Sherran Needs, NP  furosemide (LASIX) 40 MG tablet Take 2 tablets by mouth every morning and 2 additional tabs as needed for wt gain/abdominal distension Patient taking differently: Take 40 mg by mouth See admin instructions. Take 2 tablets by mouth every  morning and 2 additional tabs as needed for wt gain/abdominal distension  03/30/19   [name], MD  gabapentin (NEURONTIN) 300 MG capsule TAKE 1 CAPSULE BY MOUTH TWICE A DAY Patient taking differently: Take 300 mg by mouth 2 (two) times daily.  04/25/19   Heath Lark, MD  isosorbide mononitrate (IMDUR) 60 MG 24 hr tablet Take 1.5 tablets (90 mg total) by mouth daily. 05/31/19 09/28/19  Nita Sells, MD  magnesium oxide (MAG-OX) 400 (241.3 Mg) MG tablet Take 2 tablets (800 mg total) by mouth daily. 06/22/19   Sherran Needs, NP  metoprolol (TOPROL-XL) 200 MG 24 hr tablet Take 1 tablet (200 mg total) by mouth daily. Take with or immediately following a meal. 12/10/18   Sherran Needs, NP  nitroGLYCERIN (NITROSTAT) 0.4 MG SL tablet PLACE 1 TABLET (0.4 MG TOTAL) UNDER THE TONGUE EVERY 5 (FIVE) MINUTES AS NEEDED FOR CHEST PAIN Patient taking differently: Place 0.4 mg under the tongue every 5 (five) minutes as needed for chest pain.  05/26/19   Sherran Needs, NP  potassium chloride SA (KLOR-CON) 20 MEQ tablet Take 1 tablet (20 mEq total) by mouth 2 (two) times daily. 01/21/19   Allred, Jeneen Rinks, MD  rivaroxaban (XARELTO) 20 MG TABS tablet Take 1 tablet (20 mg total) by mouth daily with supper. 06/15/19   Amin, Jeanella Flattery, MD    Allergies    Bee venom, Other, Oxycodone hcl, Amiodarone, Chlorhexidine, and Prednisone  Review of Systems   Review of Systems  Cardiovascular: Positive for chest pain.  All other systems reviewed and are negative.   Physical Exam Updated Vital Signs BP (!) 156/83   Pulse (!) 107   Temp 99.3 F (37.4 C) (Oral)   Resp 18   SpO2 92%   Physical Exam Vitals and nursing [name] reviewed.  Constitutional:      General: She is not in acute distress.    Appearance: She is well-developed. She is obese.  HENT:     Head: Atraumatic.  Eyes:     Conjunctiva/sclera: Conjunctivae normal.  Cardiovascular:     Rate and Rhythm: Rhythm irregular.     Heart  sounds: Normal heart sounds.  Pulmonary:     Effort: Pulmonary effort is normal.     Breath sounds: Normal breath sounds. No decreased breath sounds.  Chest:     Chest wall: No tenderness.  Abdominal:     Palpations: Abdomen is soft.  Musculoskeletal:     Cervical back: Neck supple.     Right lower leg: No edema.     Left lower leg: No edema.  Skin:    Findings: No rash.  Neurological:     Mental Status: She is alert and oriented to person, place, and time.  Psychiatric:        Mood and Affect: Mood normal.     ED Results / Procedures / Treatments   Labs (all labs ordered are listed, but only abnormal results are displayed) Labs Reviewed  BASIC METABOLIC PANEL - Abnormal; Notable for the following components:      Result Value   Glucose, Bld 112 (*)    Creatinine, Ser 1.20 (*)    Calcium 8.3 (*)    GFR calc non Af Amer 41 (*)    GFR calc Af Amer 47 (*)    All other components within normal limits  CBC - Abnormal; Notable for the following components:   MCV 101.0 (*)    All other components within normal limits  TROPONIN I (HIGH SENSITIVITY) - Abnormal; Notable for the following components:   Troponin I (High Sensitivity) 36 (*)    All other components within normal limits  TROPONIN I (HIGH SENSITIVITY) - Abnormal; Notable for the following components:   Troponin I (High Sensitivity) 47 (*)    All other components within normal limits    EKG EKG Interpretation  Date/Time:  Monday Jun 27 2019 11:49:49 EDT Ventricular Rate:  89 PR Interval:    QRS Duration: 167 QT Interval:  458 QTC Calculation: 558 R Axis:   -89 Text Interpretation: Atrial flutter replaced paced rythym Nonspecific IVCD with LAD LVH with secondary repolarization abnormality Anterior Q waves, possibly due to LVH Baseline wander in lead(s) V2 slight morphology change with more negative deflection in inferior leads Otherwise no significant change Confirmed by Deno Etienne [phone]) on 06/27/2019 11:52:41  AM   Radiology DG Chest Port 1 View  Result Date: 06/27/2019 CLINICAL DATA:  Chest pain EXAM: PORTABLE CHEST 1 VIEW COMPARISON:  06/09/2019 chest radiograph. FINDINGS: Right internal jugular Port-A-Cath and 2 lead left subclavian pacemaker are stable in configuration. Loop recorder overlies the left heart. Stable cardiomediastinal silhouette with mild cardiomegaly. No pneumothorax. No right pleural effusion. Possible trace left pleural effusion. No overt pulmonary edema. IMPRESSION: 1. Stable mild cardiomegaly without overt pulmonary edema. 2. Possible trace left pleural effusion. Electronically Signed   By: Ilona Sorrel M.D.   On: 06/27/2019 12:05    Procedures .Critical Care Performed by: Domenic Moras, PA-C Authorized by: Domenic Moras, PA-C   Critical care provider statement:    Critical care time (minutes):  30   Critical care was time spent personally by me on the following activities:  Discussions with consultants, evaluation of patient's response to treatment, examination of patient, ordering and performing treatments and interventions, ordering and review of laboratory studies, ordering and review of radiographic studies, pulse oximetry, re-evaluation of patient's condition, obtaining history from patient or surrogate and review of old charts   (including critical care time)  Medications Ordered in ED Medications  nitroGLYCERIN (NITROSTAT) SL tablet 0.4 mg ( Sublingual Not Given 06/27/19 1318)    ED Course  I have reviewed the triage vital signs and the nursing notes.  Pertinent labs & imaging results that were available during my care of the patient were reviewed  by me and considered in my medical decision making (see chart for details).    MDM Rules/Calculators/A&P                      BP (!) 156/83   Pulse (!) 107   Temp 99.3 F (37.4 C) (Oral)   Resp 18   SpO2 92%   Final Clinical Impression(s) / ED Diagnoses Final diagnoses:  NSTEMI (non-ST elevated myocardial  infarction) ([place])    Rx / DC Orders ED Discharge Orders    None     11:53 AM Patient with significant cardiac history including multiple vessels disease with bypass here with chest pain that resolved with sublingual nitro. She also received 324mg  of ASA. She is currently chest pain-free.  Heart score of 6, moderate risk of MACE.   1:22 PM History of A. fib with RVR requiring multiple cardioversions from recent hospitalizations.  Today heart rate is currently rate controlled.  She is currently symptom-free, cardiology was consulted and will see patient.  Patient noted questing for sublingual nitro as her pain returns.  She was given sublingual nitro and subsequently felt better.  Her initial troponin is elevated at 36.  Awaits delta troponin.  Mild AKI with creatinine of 1.2.  Chest x-ray reviewed by me shows cardiomegaly without overt pulmonary edema.  Possible trace left pleural effusion.  2:47 PM Second trop has risen to 59, from 30.  Pt report no active CP at this time.  Suspect demand ischemia, but she does have multi-vessel disease.  She will benefit admission from cardiology for further management.    3:23 PM Pt sign out to oncoming provider who will f/u on card's recommendation for disposition.   Domenic Moras, PA-C 06/27/19 [place], [place], DO 06/27/19 1529

## 2019-06-27 NOTE — Consult Note (Signed)
Cardiology Consultation:   Patient ID: Patty [name] MRN: [id number]; DOB: July 23, 1930  Admit date: 06/27/2019 Date of Consult: 06/27/2019  Primary Care Provider: [name], MD Primary Cardiologist: Larae Grooms, MD  Primary Electrophysiologist:  Thompson Grayer, MD    Patient Profile:   Patty [name] is a 86 y.o. female with a hx of CAD, chronic combined CHF, permanent atrial fibrillation on Coumadin, history of tachy-brady syndrome s/p St Jude PPM (previous leadless pacemaker had premature battery failure and is inactive), CKD stage III, granulosa cell carcinoma, HTN, HLD, PVD and obstructive sleep apnea who is being seen today for the evaluation of chest pain at the request of Dr. Darl Householder.  History of Present Illness:   Patty [name] is a 86 year old female with past medical history of CAD, chronic combined CHF, permanent atrial fibrillation on Xarelto, history of tachy-brady syndrome s/p St Jude PPM (previous leadless pacemaker had premature battery failure and is inactive), CKD stage III, granulosa cell carcinoma with involvement of ovary (gave up treatment, focus on quality of life), hypertension, hyperlipidemia, PVD and obstructive sleep apnea.  Patient had multiple cardiac catheterization in the past.  He had 2 stents to the LAD and 1 stent to the left circumflex in 2000.  He underwent DES of left circumflex and stent placement to LAD in 2014.  She underwent DES to LAD for in-stent restenosis in 2015 and another DES to ostial LAD in 2016.  She had orbital atherectomy and drug-eluting stent placement to mid left circumflex in June 2018.  Most recent cardiac catheterization was performed in October 2020 which showed three-vessel disease unchanged when compared to the previous cardiac catheterization, medical therapy was recommended.  Patient was seen by A. fib clinic on 05/26/2019, at which time she was noted to be in atrial flutter.  Device interrogation from early April showed 7% atrial  fibrillation burden.  There has also been some progression of her chronic granulosa cell carcinoma.  She has been taken off of tamoxifen and focused on quality of life and was enrolled in palliative care.  She was admitted for acute on chronic diastolic heart failure in April 2021.  She underwent diuresis and was released from the hospital to resume oral diuretic.  Unfortunately patient returned on 06/09/2019 with recurrent heart failure and atrial flutter. She had elevated troponin during the hospitalization which was felt to be demand ischemia.  Patient was treated with IV diuretic and subsequently underwent cardioversion on 5/7.  Patient had recurrent atrial flutter and underwent another cardioversion on 5/11. Coumadin was changed to Xarelto during the admission.  She was seen in the A. fib clinic for post hospital follow-up on 5/19, EKG at the time showed she was in atrial paced rhythm without any recurrence of atrial flutter.  Since discharge, patient has been doing well at home.  She no longer have the chest pressure and significant shortness of breath.  Symptom recurred yesterday on 06/26/2019.  She says by 10 PM, she could feel more chest discomfort than usual.  Her heart rate was also unusually fast around 110.  She woke up around 2 AM this morning with worsening chest pain, shortness of breath, palpitation and fatigue.  She eventually sought medical attention at [facility].  Upon arrival at [facility], ED, she was noted to be in atrial flutter with RVR, average heart rate around 100-110.  High-sensitivity troponin was 36 --> 47.  Creatinine was mildly elevated at 1.2.  Chest x-ray negative for acute process.  Cardiology  was asked to evaluate the patient for chest pain and shortness of breath in the setting of recurrent atrial flutter.    Past Medical History:  Diagnosis Date  . Bleeding behind the abdominal cavity 10/2017  . Chronic anticoagulation - coumadin, CHADS2VASC=6 05/17/2015  . CKD  (chronic kidney disease), stage III   . Combined systolic and diastolic heart failure ([place])   . Coronary artery disease    a. s/p multiple stents - stenting x 2 to the LAD and x 1 to the LCx in 2000, rotational arthrectomy to proximal LCx and DES to LAD in 2014 and DES to LCx in 2014, along with DES to LAD for re-in-stent stenosis in 2015, and DES to ostial LAD in 2016. b. 07/2016 - orbital atherectomy & DES to mid LCx.  . Diverticulitis   . Granulosa cell carcinoma ([place])    abd; last episode was in 2009  . Hyperlipidemia   . Hypertension   . LBBB (left bundle branch block)   . Myocardial infarction ([place]) 2002  . Obesity   . OSA on CPAP   . Ovarian ca ([place]) 2019  . Pacemaker failure    a. Prior leadless PPM with premature battery failure, being managed conservatively without replacement.  . Peripheral vascular disease ([place])   . Permanent atrial fibrillation ([place]) 2013  . Renal artery stenosis ([place])   . Tachycardia-bradycardia syndrome ([place])    a. s/p leadless pacemaker ([name]) implanted by Dr [name]  . Varicose veins     Past Surgical History:  Procedure Laterality Date  . ABDOMINAL HYSTERECTOMY    . CARDIAC CATHETERIZATION  09/03/2007   EF 70%; Failed attempt at PCI to OM  . CARDIAC CATHETERIZATION  11/01/2003   EF 70%  . CARDIAC CATHETERIZATION N/A 12/14/2015   Procedure: Left Heart Cath and Coronary Angiography;  Surgeon: Burnell Blanks, MD;  Location: [place] CV LAB;  Service: Cardiovascular;  Laterality: N/A;  . CARDIOVERSION  12/31/2011   Procedure: CARDIOVERSION;  Surgeon: [name], MD;  Location: [facility] ENDOSCOPY;  Service: Cardiovascular;  Laterality: N/A;  . CARDIOVERSION N/A 12/31/2011   Procedure: CARDIOVERSION;  Surgeon: [name], MD;  Location: [facility] CATH LAB;  Service: Cardiovascular;  Laterality: N/A;  . CARDIOVERSION N/A 11/15/2018   Procedure: CARDIOVERSION;  Surgeon: Dorothy Spark, MD;  Location: [facility] ENDOSCOPY;  Service:  Cardiovascular;  Laterality: N/A;  . CARDIOVERSION N/A 06/10/2019   Procedure: CARDIOVERSION;  Surgeon: Fay Records, MD;  Location: [facility] ENDOSCOPY;  Service: Cardiovascular;  Laterality: N/A;  . CARDIOVERSION N/A 06/14/2019   Procedure: CARDIOVERSION;  Surgeon: Pixie Casino, MD;  Location: [place];  Service: Cardiovascular;  Laterality: N/A;  . CATARACT EXTRACTION, BILATERAL  2015  . CHOLECYSTECTOMY  1980's  . COLON SURGERY  2004   colectomy for diverticulosis  . CORONARY ANGIOPLASTY WITH STENT PLACEMENT  2000    and 08/11/2012; 11/12/2012: 3 + 2 LAD & CFX; 2nd CFX stent 11/12/2012  . CORONARY ATHERECTOMY N/A 07/15/2016   Procedure: Coronary Atherectomy;  Surgeon: Martinique, Peter M, MD;  Location: [place] CV LAB;  Service: Cardiovascular;  Laterality: N/A;  . CORONARY BALLOON ANGIOPLASTY N/A 07/14/2016   Procedure: Coronary Balloon Angioplasty;  Surgeon: Martinique, Peter M, MD;  Location: [place] CV LAB;  Service: Cardiovascular;  Laterality: N/A;  . CORONARY STENT INTERVENTION N/A 07/15/2016   Procedure: Coronary Stent Intervention;  Surgeon: Martinique, Peter M, MD;  Location: [place] CV LAB;  Service: Cardiovascular;  Laterality: N/A;  . FRACTIONAL  FLOW RESERVE WIRE  10/07/2013   Procedure: FRACTIONAL FLOW RESERVE WIRE;  Surgeon: [name], MD;  Location: [facility] CATH LAB;  Service: Cardiovascular;;  . HERNIA REPAIR  2005   "laparoscopic"  . IR IMAGING GUIDED PORT INSERTION  10/28/2017  . LEFT HEART CATH AND CORONARY ANGIOGRAPHY N/A 07/14/2016   Procedure: Left Heart Cath and Coronary Angiography;  Surgeon: Martinique, Peter M, MD;  Location: [place] CV LAB;  Service: Cardiovascular;  Laterality: N/A;  . LEFT HEART CATH AND CORONARY ANGIOGRAPHY N/A 11/10/2018   Procedure: LEFT HEART CATH AND CORONARY ANGIOGRAPHY;  Surgeon: Wellington Hampshire, MD;  Location: [place] CV LAB;  Service: Cardiovascular;  Laterality: N/A;  . LEFT HEART CATHETERIZATION WITH CORONARY ANGIOGRAM N/A  11/12/2012   Procedure: LEFT HEART CATHETERIZATION WITH CORONARY ANGIOGRAM;  Surgeon: [name], MD;  Location: [facility] CATH LAB;  Service: Cardiovascular;  Laterality: N/A;  . LEFT HEART CATHETERIZATION WITH CORONARY ANGIOGRAM N/A 10/07/2013   Procedure: LEFT HEART CATHETERIZATION WITH CORONARY ANGIOGRAM;  Surgeon: [name], MD;  Location: [facility] CATH LAB;  Service: Cardiovascular;  Laterality: N/A;  . LEFT HEART CATHETERIZATION WITH CORONARY ANGIOGRAM N/A 12/14/2013   Procedure: LEFT HEART CATHETERIZATION WITH CORONARY ANGIOGRAM;  Surgeon: Sinclair Grooms, MD;  Location: [facility] CATH LAB;  Service: Cardiovascular;  Laterality: N/A;  . LEFT HEART CATHETERIZATION WITH CORONARY ANGIOGRAM N/A 05/16/2014   Procedure: LEFT HEART CATHETERIZATION WITH CORONARY ANGIOGRAM;  Surgeon: Sherren Mocha, MD;  Location: [facility] CATH LAB;  Service: Cardiovascular;  Laterality: N/A;  . PERCUTANEOUS CORONARY INTERVENTION-BALLOON ONLY  08/04/2012   Procedure: PERCUTANEOUS CORONARY INTERVENTION-BALLOON ONLY;  Surgeon: [name], MD;  Location: [facility] CATH LAB;  Service: Cardiovascular;;  . PERCUTANEOUS CORONARY ROTOBLATOR INTERVENTION (PCI-R) N/A 08/11/2012   Procedure: PERCUTANEOUS CORONARY ROTOBLATOR INTERVENTION (PCI-R);  Surgeon: [name], MD;  Location: [facility] CATH LAB;  Service: Cardiovascular;  Laterality: N/A;  . PERCUTANEOUS CORONARY STENT INTERVENTION (PCI-S)  10/07/2013   Procedure: PERCUTANEOUS CORONARY STENT INTERVENTION (PCI-S);  Surgeon: [name], MD;  Location: [facility] CATH LAB;  Service: Cardiovascular;;  . PERMANENT PACEMAKER INSERTION N/A 03/16/2012   [name] (SJM) leadless pacemaker (LEADLESS II STUDY PATEINT)  . SALIVARY GLAND SURGERY  2000's   "had a little lump removed; granulosa related; it was benign" (08/11/2012)  . TEE WITHOUT CARDIOVERSION  12/31/2011   Procedure: TRANSESOPHAGEAL ECHOCARDIOGRAM (TEE);  Surgeon: [name], MD;  Location: [facility] ENDOSCOPY;  Service:  Cardiovascular;  Laterality: N/A;  . UMBILICAL GRANULOMA EXCISION  2000   2003; 2004; 2007: "all in my abdomen including small intestines, outside my ?uterus/etc" (08/11/2012)  . VARICOSE VEIN SURGERY Bilateral 1977     Home Medications:  Prior to Admission medications   Medication Sig Start Date End Date Taking? Authorizing Provider  acetaminophen (TYLENOL) 500 MG tablet Take 500 mg by mouth every 6 (six) hours as needed for mild pain or headache.    Yes [provider]  atorvastatin (LIPITOR) 80 MG tablet TAKE 1 TABLET BY MOUTH EVERY DAY IN THE MORNING Patient taking differently: Take 80 mg by mouth daily.  04/04/19  Yes [name], MD  beta carotene w/minerals (OCUVITE) tablet Take 1 tablet by mouth 2 (two) times daily.    Yes [provider]  clopidogrel (PLAVIX) 75 MG tablet TAKE 1 TABLET BY MOUTH EVERY DAY Patient taking differently: Take 75 mg by mouth daily.  03/15/19  Yes [name], MD  Coenzyme Q-10 100 MG capsule Take 200 mg by  mouth at bedtime.    Yes [provider]  dofetilide (TIKOSYN) 125 MCG capsule Take 1 capsule (125 mcg total) by mouth 2 (two) times daily. 12/16/18  Yes Sherran Needs, NP  furosemide (LASIX) 40 MG tablet Take 2 tablets by mouth every morning and 2 additional tabs as needed for wt gain/abdominal distension Patient taking differently: Take 40 mg by mouth See admin instructions. Take 2 tablets by mouth every morning and 2 additional tabs as needed for wt gain/abdominal distension  03/30/19  Yes [name], MD  gabapentin (NEURONTIN) 300 MG capsule TAKE 1 CAPSULE BY MOUTH TWICE A DAY Patient taking differently: Take 300 mg by mouth 2 (two) times daily.  04/25/19  Yes Heath Lark, MD  isosorbide mononitrate (IMDUR) 60 MG 24 hr tablet Take 1.5 tablets (90 mg total) by mouth daily. 05/31/19 09/28/19 Yes Nita Sells, MD  magnesium oxide (MAG-OX) 400 (241.3 Mg) MG tablet Take 2 tablets (800 mg total) by mouth  daily. 06/22/19  Yes Sherran Needs, NP  metoprolol (TOPROL-XL) 200 MG 24 hr tablet Take 1 tablet (200 mg total) by mouth daily. Take with or immediately following a meal. 12/10/18  Yes Sherran Needs, NP  Multiple Vitamin (MULTIVITAMIN) capsule Take 1 capsule by mouth daily.   Yes [provider]  nitroGLYCERIN (NITROSTAT) 0.4 MG SL tablet PLACE 1 TABLET (0.4 MG TOTAL) UNDER THE TONGUE EVERY 5 (FIVE) MINUTES AS NEEDED FOR CHEST PAIN Patient taking differently: Place 0.4 mg under the tongue every 5 (five) minutes as needed for chest pain.  05/26/19  Yes Sherran Needs, NP  potassium chloride SA (KLOR-CON) 20 MEQ tablet Take 1 tablet (20 mEq total) by mouth 2 (two) times daily. 01/21/19  Yes Allred, Jeneen Rinks, MD  rivaroxaban (XARELTO) 20 MG TABS tablet Take 1 tablet (20 mg total) by mouth daily with supper. 06/15/19  Yes Damita Lack, MD    Inpatient Medications: Scheduled Meds:  Continuous Infusions:  PRN Meds: nitroGLYCERIN  Allergies:    Allergies  Allergen Reactions  . Bee Venom Anaphylaxis  . Other Nausea And Vomiting and Other (See Comments)    Pain medications cause severe vomiting. Tolerated slow IV morphine drip  . Oxycodone Hcl Nausea And Vomiting  . Amiodarone Nausea Only  . Chlorhexidine   . Prednisone Palpitations and Other (See Comments)    "Rapid Heart Beat"    Social History:   Social History   Socioeconomic History  . Marital status: Widowed    Spouse name: Not on file  . Number of children: 4  . Years of education: Not on file  . Highest education level: Not on file  Occupational History  . Occupation: Retired Nurse, learning disability estate  Tobacco Use  . Smoking status: Former Smoker    Packs/day: 1.00    Years: 32.00    Pack years: 32.00    Types: Cigarettes    Quit date: 02/03/1974    Years since quitting: 45.4  . Smokeless tobacco: Never Used  Substance and Sexual Activity  . Alcohol use: Yes    Alcohol/week: 7.0 standard drinks     Types: 7 Glasses of wine per week    Comment: one glass wine nightly with dinner  . Drug use: No  . Sexual activity: Never  Other Topics Concern  . Not on file  Social History Narrative   Lives alone.   Social Determinants of Health   Financial Resource Strain:   . Difficulty of Paying Living Expenses:  Food Insecurity:   . Worried About Charity fundraiser in the Last Year:   . Arboriculturist in the Last Year:   Transportation Needs:   . Film/video editor (Medical):   Marland Kitchen Lack of Transportation (Non-Medical):   Physical Activity:   . Days of Exercise per Week:   . Minutes of Exercise per Session:   Stress:   . Feeling of Stress :   Social Connections:   . Frequency of Communication with Friends and Family:   . Frequency of Social Gatherings with Friends and Family:   . Attends Religious Services:   . Active Member of Clubs or Organizations:   . Attends Archivist Meetings:   Marland Kitchen Marital Status:   Intimate Partner Violence:   . Fear of Current or Ex-Partner:   . Emotionally Abused:   Marland Kitchen Physically Abused:   . Sexually Abused:     Family History:    Family History  Problem Relation Age of Onset  . Stroke Mother   . Heart attack Father   . Diabetes Father   . Hypertension Father   . Heart attack Brother   . Diabetes Brother   . Hypertension Brother   . Kidney failure Brother      ROS:  Please see the history of present illness.   All other ROS reviewed and negative.     Physical Exam/Data:   Vitals:   06/27/19 1458 06/27/19 1500 06/27/19 1554 06/27/19 1600  BP: (!) 134/108 100/90  128/83  Pulse: (!) 109 94  (!) 108  Resp: (!) 22 16  (!) 21  Temp:      TempSrc:      SpO2: 99% 96%  96%  Weight:   77.7 kg   Height:   5' (1.524 m)    No intake or output data in the 24 hours ending 06/27/19 1640 Last 3 Weights 06/27/2019 06/22/2019 06/15/2019  Weight (lbs) 171 lb 3.2 oz 173 lb 3.2 oz 167 lb 14.4 oz  Weight (kg) 77.656 kg 78.563 kg 76.159 kg      Body mass index is 33.44 kg/m.  General:  Well nourished, well developed, in no acute distress HEENT: normal Lymph: no adenopathy Neck: no JVD Endocrine:  No thryomegaly Vascular: No carotid bruits; FA pulses 2+ bilaterally without bruits  Cardiac:  normal S1, S2; irregularly irregular; 1+ murmur  Lungs:  clear to auscultation bilaterally, no wheezing, rhonchi or rales  Abd: soft, nontender, no hepatomegaly  Ext: no edema Musculoskeletal:  No deformities, BUE and BLE strength normal and equal Skin: warm and dry  Neuro:  CNs 2-12 intact, no focal abnormalities noted Psych:  Normal affect   EKG:  The EKG was personally reviewed and demonstrates: atrial flutter with variable AV conduction, LBBB Telemetry:  Telemetry was personally reviewed and demonstrates:  Atrial flutter  Relevant CV Studies:  Echo 05/29/2019 1. Left ventricular ejection fraction, by estimation, is 40 to 45%. The  left ventricle has moderately decreased function. The left ventricle  demonstrates regional wall motion abnormalities (see scoring  diagram/findings for description). There is severe  left ventricular hypertrophy. Left ventricular diastolic parameters are  indeterminate.  2. Right ventricular systolic function is mildly reduced. The right  ventricular size is normal.  3. Left atrial size was moderately dilated.  4. The mitral valve is degenerative. Trivial mitral valve regurgitation.  5. The aortic valve is abnormal. Aortic valve regurgitation is mild. Mild  aortic valve sclerosis is present, with no  evidence of aortic valve  stenosis.  6. The inferior vena cava is normal in size with greater than 50%  respiratory variability, suggesting right atrial pressure of 3 mmHg.   Laboratory Data:  High Sensitivity Troponin:   Recent Labs  Lab 06/08/19 2358 06/09/19 0238 06/27/19 1151 06/27/19 1343  TROPONINIHS 237* 244* 36* 47*     Chemistry Recent Labs  Lab 06/22/19 1135 06/27/19 1151    NA 142 140  K 3.8 3.6  CL 103 101  CO2 32 30  GLUCOSE 96 112*  BUN 16 22  CREATININE 1.04* 1.20*  CALCIUM 8.5* 8.3*  GFRNONAA 49* 41*  GFRAA 56* 47*  ANIONGAP 7 9    No results for input(s): PROT, ALBUMIN, AST, ALT, ALKPHOS, BILITOT in the last 168 hours. Hematology Recent Labs  Lab 06/22/19 1135 06/27/19 1151  WBC 5.3 4.4  RBC 3.80* 3.88  HGB 12.0 12.2  HCT 38.4 39.2  MCV 101.1* 101.0*  MCH 31.6 31.4  MCHC 31.3 31.1  RDW 14.9 15.2  PLT 185 167   BNPNo results for input(s): BNP, PROBNP in the last 168 hours.  DDimer No results for input(s): DDIMER in the last 168 hours.   Radiology/Studies:  DG Chest Port 1 View  Result Date: 06/27/2019 CLINICAL DATA:  Chest pain EXAM: PORTABLE CHEST 1 VIEW COMPARISON:  06/09/2019 chest radiograph. FINDINGS: Right internal jugular Port-A-Cath and 2 lead left subclavian pacemaker are stable in configuration. Loop recorder overlies the left heart. Stable cardiomediastinal silhouette with mild cardiomegaly. No pneumothorax. No right pleural effusion. Possible trace left pleural effusion. No overt pulmonary edema. IMPRESSION: 1. Stable mild cardiomegaly without overt pulmonary edema. 2. Possible trace left pleural effusion. Electronically Signed   By: Ilona Sorrel M.D.   On: 06/27/2019 12:05    Assessment and Plan:   1. Recurrent atrial flutter: Previous device interrogation in early April showed only 6% A. fib/atrial flutter burden.  Unfortunately, patient is very symptomatic and tend to have acute heart failure symptoms every time she goes into atrial flutter.  More recently, she underwent DCCV on 5/7 and 06/14/2019.  She returned today for evaluation of chest pain and shortness of breath, she was noted to be in recurrent atrial flutter.  She previously did not tolerate amiodarone.  Currently on Tikosyn 125 mcg twice daily and Toprol-XL 200 mg daily.  Coumadin was switched to Xarelto during the last hospitalization.  Limited option at this time  for this patient.   - discussed with Dr. Martinique, who recommended start her on 0.0625 mg digoxin for rate control. Followup with Dr. Irish Lack or his APP in 1-3 weeks, will need digoxin level at the time.   2. Chest pain with mildly elevated troponin: She has chest pain in the past however this does not consistently, every time she has atrial flutter.  Previous cardiac catheterization was in October 2020 at which time, coronary anatomy was unchanged however she does have severe three-vessel disease.   - 2 brief episodes of chest pain, Dr. Martinique recommended better control of anxiety and pain via her outpatient palliative care service.    3. Chronic combined CHF             - baseline EF 40-45% on Echo 05/29/2019  - euvolemic on exam.  4. CAD: severe multivessel CAD on last cath in 11/2018, no change in anatomy when compared to the previous cath  5. history of tachy-brady syndrome s/p St Jude PPM (previous leadless pacemaker had premature battery failure and  is inactive)  6. CKD stage III  7. granulosa cell carcinoma with involvement of ovary (gave up treatment, focus on quality of life)             - not a candidate for aggressive workup  8. Hypertension: stable.   9. Hyperlipidemia      For questions or updates, please contact [place] Please consult [email or web address] for contact info under     Hilbert Corrigan, [place]  06/27/2019 4:40 PM

## 2019-06-30 DIAGNOSIS — E785 Hyperlipidemia, unspecified: Secondary | ICD-10-CM | POA: Diagnosis not present

## 2019-06-30 DIAGNOSIS — I130 Hypertensive heart and chronic kidney disease with heart failure and stage 1 through stage 4 chronic kidney disease, or unspecified chronic kidney disease: Secondary | ICD-10-CM | POA: Diagnosis not present

## 2019-06-30 DIAGNOSIS — I4892 Unspecified atrial flutter: Secondary | ICD-10-CM | POA: Diagnosis not present

## 2019-06-30 DIAGNOSIS — I5043 Acute on chronic combined systolic (congestive) and diastolic (congestive) heart failure: Secondary | ICD-10-CM | POA: Diagnosis not present

## 2019-06-30 DIAGNOSIS — I482 Chronic atrial fibrillation, unspecified: Secondary | ICD-10-CM | POA: Diagnosis not present

## 2019-06-30 DIAGNOSIS — N183 Chronic kidney disease, stage 3 unspecified: Secondary | ICD-10-CM | POA: Diagnosis not present

## 2019-07-01 DIAGNOSIS — I482 Chronic atrial fibrillation, unspecified: Secondary | ICD-10-CM | POA: Diagnosis not present

## 2019-07-01 DIAGNOSIS — E785 Hyperlipidemia, unspecified: Secondary | ICD-10-CM | POA: Diagnosis not present

## 2019-07-01 DIAGNOSIS — I5043 Acute on chronic combined systolic (congestive) and diastolic (congestive) heart failure: Secondary | ICD-10-CM | POA: Diagnosis not present

## 2019-07-01 DIAGNOSIS — N183 Chronic kidney disease, stage 3 unspecified: Secondary | ICD-10-CM | POA: Diagnosis not present

## 2019-07-01 DIAGNOSIS — I130 Hypertensive heart and chronic kidney disease with heart failure and stage 1 through stage 4 chronic kidney disease, or unspecified chronic kidney disease: Secondary | ICD-10-CM | POA: Diagnosis not present

## 2019-07-01 DIAGNOSIS — I4892 Unspecified atrial flutter: Secondary | ICD-10-CM | POA: Diagnosis not present

## 2019-07-04 DIAGNOSIS — E785 Hyperlipidemia, unspecified: Secondary | ICD-10-CM | POA: Diagnosis not present

## 2019-07-04 DIAGNOSIS — N183 Chronic kidney disease, stage 3 unspecified: Secondary | ICD-10-CM | POA: Diagnosis not present

## 2019-07-04 DIAGNOSIS — I130 Hypertensive heart and chronic kidney disease with heart failure and stage 1 through stage 4 chronic kidney disease, or unspecified chronic kidney disease: Secondary | ICD-10-CM | POA: Diagnosis not present

## 2019-07-04 DIAGNOSIS — I4892 Unspecified atrial flutter: Secondary | ICD-10-CM | POA: Diagnosis not present

## 2019-07-04 DIAGNOSIS — I482 Chronic atrial fibrillation, unspecified: Secondary | ICD-10-CM | POA: Diagnosis not present

## 2019-07-04 DIAGNOSIS — I5043 Acute on chronic combined systolic (congestive) and diastolic (congestive) heart failure: Secondary | ICD-10-CM | POA: Diagnosis not present

## 2019-07-05 DIAGNOSIS — N183 Chronic kidney disease, stage 3 unspecified: Secondary | ICD-10-CM | POA: Diagnosis not present

## 2019-07-05 DIAGNOSIS — I130 Hypertensive heart and chronic kidney disease with heart failure and stage 1 through stage 4 chronic kidney disease, or unspecified chronic kidney disease: Secondary | ICD-10-CM | POA: Diagnosis not present

## 2019-07-05 DIAGNOSIS — I5043 Acute on chronic combined systolic (congestive) and diastolic (congestive) heart failure: Secondary | ICD-10-CM | POA: Diagnosis not present

## 2019-07-05 DIAGNOSIS — E785 Hyperlipidemia, unspecified: Secondary | ICD-10-CM | POA: Diagnosis not present

## 2019-07-05 DIAGNOSIS — I4892 Unspecified atrial flutter: Secondary | ICD-10-CM | POA: Diagnosis not present

## 2019-07-05 DIAGNOSIS — I482 Chronic atrial fibrillation, unspecified: Secondary | ICD-10-CM | POA: Diagnosis not present

## 2019-07-06 DIAGNOSIS — I482 Chronic atrial fibrillation, unspecified: Secondary | ICD-10-CM | POA: Diagnosis not present

## 2019-07-06 DIAGNOSIS — I4892 Unspecified atrial flutter: Secondary | ICD-10-CM | POA: Diagnosis not present

## 2019-07-06 DIAGNOSIS — I5043 Acute on chronic combined systolic (congestive) and diastolic (congestive) heart failure: Secondary | ICD-10-CM | POA: Diagnosis not present

## 2019-07-06 DIAGNOSIS — N183 Chronic kidney disease, stage 3 unspecified: Secondary | ICD-10-CM | POA: Diagnosis not present

## 2019-07-06 DIAGNOSIS — E785 Hyperlipidemia, unspecified: Secondary | ICD-10-CM | POA: Diagnosis not present

## 2019-07-06 DIAGNOSIS — I130 Hypertensive heart and chronic kidney disease with heart failure and stage 1 through stage 4 chronic kidney disease, or unspecified chronic kidney disease: Secondary | ICD-10-CM | POA: Diagnosis not present

## 2019-07-11 DIAGNOSIS — I4892 Unspecified atrial flutter: Secondary | ICD-10-CM | POA: Diagnosis not present

## 2019-07-11 DIAGNOSIS — E785 Hyperlipidemia, unspecified: Secondary | ICD-10-CM | POA: Diagnosis not present

## 2019-07-11 DIAGNOSIS — I130 Hypertensive heart and chronic kidney disease with heart failure and stage 1 through stage 4 chronic kidney disease, or unspecified chronic kidney disease: Secondary | ICD-10-CM | POA: Diagnosis not present

## 2019-07-11 DIAGNOSIS — I482 Chronic atrial fibrillation, unspecified: Secondary | ICD-10-CM | POA: Diagnosis not present

## 2019-07-11 DIAGNOSIS — I5043 Acute on chronic combined systolic (congestive) and diastolic (congestive) heart failure: Secondary | ICD-10-CM | POA: Diagnosis not present

## 2019-07-11 DIAGNOSIS — N183 Chronic kidney disease, stage 3 unspecified: Secondary | ICD-10-CM | POA: Diagnosis not present

## 2019-07-12 DIAGNOSIS — I4892 Unspecified atrial flutter: Secondary | ICD-10-CM | POA: Diagnosis not present

## 2019-07-12 DIAGNOSIS — E785 Hyperlipidemia, unspecified: Secondary | ICD-10-CM | POA: Diagnosis not present

## 2019-07-12 DIAGNOSIS — N183 Chronic kidney disease, stage 3 unspecified: Secondary | ICD-10-CM | POA: Diagnosis not present

## 2019-07-12 DIAGNOSIS — I5043 Acute on chronic combined systolic (congestive) and diastolic (congestive) heart failure: Secondary | ICD-10-CM | POA: Diagnosis not present

## 2019-07-12 DIAGNOSIS — I130 Hypertensive heart and chronic kidney disease with heart failure and stage 1 through stage 4 chronic kidney disease, or unspecified chronic kidney disease: Secondary | ICD-10-CM | POA: Diagnosis not present

## 2019-07-12 DIAGNOSIS — I482 Chronic atrial fibrillation, unspecified: Secondary | ICD-10-CM | POA: Diagnosis not present

## 2019-07-15 ENCOUNTER — Telehealth: Payer: Self-pay | Admitting: Interventional Cardiology

## 2019-07-15 MED ORDER — RIVAROXABAN 20 MG PO TABS: 20.0000 mg | ORAL_TABLET | Freq: Every day | ORAL | 0 refills | Status: DC

## 2019-07-15 NOTE — Telephone Encounter (Signed)
Discussed with Milas Hock D, will refill prescription for xarelto 20mg  daily with a 1 month supply. Pt is to see Dr. Beau Fanny 07/25/2019, put on appointment note for CBC and Bmet to be checked.

## 2019-07-15 NOTE — Telephone Encounter (Signed)
New message   Pt c/o medication issue:  1. Name of Medication: rivaroxaban (XARELTO) 20 MG TABS tablet  2. How are you currently taking this medication (dosage and times per day)? As written  3. Are you having a reaction (difficulty breathing--STAT)?no   4. What is your medication issue? Patient needs a new prescription for this medication sent to [id number] - [place], [place].

## 2019-07-15 NOTE — Telephone Encounter (Signed)
Prescription refill request for Xarelto received.   Last office visit: 06/22/2019, Kayleen Memos Weight: 77.7 kg Age: 86 y.o. Scr: 1.20, 06/27/2019 CrCl: 41 ml/min

## 2019-07-18 ENCOUNTER — Other Ambulatory Visit (HOSPITAL_COMMUNITY): Payer: Self-pay | Admitting: Nurse Practitioner

## 2019-07-18 DIAGNOSIS — Z950 Presence of cardiac pacemaker: Secondary | ICD-10-CM | POA: Diagnosis not present

## 2019-07-18 DIAGNOSIS — I701 Atherosclerosis of renal artery: Secondary | ICD-10-CM | POA: Diagnosis not present

## 2019-07-18 DIAGNOSIS — E669 Obesity, unspecified: Secondary | ICD-10-CM | POA: Diagnosis not present

## 2019-07-18 DIAGNOSIS — I2510 Atherosclerotic heart disease of native coronary artery without angina pectoris: Secondary | ICD-10-CM | POA: Diagnosis not present

## 2019-07-18 DIAGNOSIS — G4733 Obstructive sleep apnea (adult) (pediatric): Secondary | ICD-10-CM | POA: Diagnosis not present

## 2019-07-18 DIAGNOSIS — E785 Hyperlipidemia, unspecified: Secondary | ICD-10-CM | POA: Diagnosis not present

## 2019-07-18 DIAGNOSIS — I447 Left bundle-branch block, unspecified: Secondary | ICD-10-CM | POA: Diagnosis not present

## 2019-07-18 DIAGNOSIS — Z8543 Personal history of malignant neoplasm of ovary: Secondary | ICD-10-CM | POA: Diagnosis not present

## 2019-07-18 DIAGNOSIS — I130 Hypertensive heart and chronic kidney disease with heart failure and stage 1 through stage 4 chronic kidney disease, or unspecified chronic kidney disease: Secondary | ICD-10-CM | POA: Diagnosis not present

## 2019-07-18 DIAGNOSIS — I252 Old myocardial infarction: Secondary | ICD-10-CM | POA: Diagnosis not present

## 2019-07-18 DIAGNOSIS — I083 Combined rheumatic disorders of mitral, aortic and tricuspid valves: Secondary | ICD-10-CM | POA: Diagnosis not present

## 2019-07-18 DIAGNOSIS — I4892 Unspecified atrial flutter: Secondary | ICD-10-CM | POA: Diagnosis not present

## 2019-07-18 DIAGNOSIS — I482 Chronic atrial fibrillation, unspecified: Secondary | ICD-10-CM | POA: Diagnosis not present

## 2019-07-18 DIAGNOSIS — Z6831 Body mass index (BMI) 31.0-31.9, adult: Secondary | ICD-10-CM | POA: Diagnosis not present

## 2019-07-18 DIAGNOSIS — N183 Chronic kidney disease, stage 3 unspecified: Secondary | ICD-10-CM | POA: Diagnosis not present

## 2019-07-18 DIAGNOSIS — K5790 Diverticulosis of intestine, part unspecified, without perforation or abscess without bleeding: Secondary | ICD-10-CM | POA: Diagnosis not present

## 2019-07-18 DIAGNOSIS — Z7901 Long term (current) use of anticoagulants: Secondary | ICD-10-CM | POA: Diagnosis not present

## 2019-07-18 DIAGNOSIS — I5043 Acute on chronic combined systolic (congestive) and diastolic (congestive) heart failure: Secondary | ICD-10-CM | POA: Diagnosis not present

## 2019-07-19 ENCOUNTER — Ambulatory Visit (INDEPENDENT_AMBULATORY_CARE_PROVIDER_SITE_OTHER): Payer: Medicare Other | Admitting: Podiatry

## 2019-07-19 ENCOUNTER — Encounter: Payer: Self-pay | Admitting: Podiatry

## 2019-07-19 ENCOUNTER — Other Ambulatory Visit: Payer: Self-pay

## 2019-07-19 DIAGNOSIS — N183 Chronic kidney disease, stage 3 unspecified: Secondary | ICD-10-CM | POA: Diagnosis not present

## 2019-07-19 DIAGNOSIS — L84 Corns and callosities: Secondary | ICD-10-CM | POA: Diagnosis not present

## 2019-07-19 DIAGNOSIS — I482 Chronic atrial fibrillation, unspecified: Secondary | ICD-10-CM | POA: Diagnosis not present

## 2019-07-19 DIAGNOSIS — I4892 Unspecified atrial flutter: Secondary | ICD-10-CM | POA: Diagnosis not present

## 2019-07-19 DIAGNOSIS — D689 Coagulation defect, unspecified: Secondary | ICD-10-CM

## 2019-07-19 DIAGNOSIS — M79676 Pain in unspecified toe(s): Secondary | ICD-10-CM | POA: Diagnosis not present

## 2019-07-19 DIAGNOSIS — I5043 Acute on chronic combined systolic (congestive) and diastolic (congestive) heart failure: Secondary | ICD-10-CM | POA: Diagnosis not present

## 2019-07-19 DIAGNOSIS — T451X5A Adverse effect of antineoplastic and immunosuppressive drugs, initial encounter: Secondary | ICD-10-CM

## 2019-07-19 DIAGNOSIS — G620 Drug-induced polyneuropathy: Secondary | ICD-10-CM | POA: Diagnosis not present

## 2019-07-19 DIAGNOSIS — I130 Hypertensive heart and chronic kidney disease with heart failure and stage 1 through stage 4 chronic kidney disease, or unspecified chronic kidney disease: Secondary | ICD-10-CM | POA: Diagnosis not present

## 2019-07-19 DIAGNOSIS — B351 Tinea unguium: Secondary | ICD-10-CM | POA: Diagnosis not present

## 2019-07-19 DIAGNOSIS — E785 Hyperlipidemia, unspecified: Secondary | ICD-10-CM | POA: Diagnosis not present

## 2019-07-19 NOTE — Patient Instructions (Signed)

## 2019-07-20 DIAGNOSIS — I130 Hypertensive heart and chronic kidney disease with heart failure and stage 1 through stage 4 chronic kidney disease, or unspecified chronic kidney disease: Secondary | ICD-10-CM | POA: Diagnosis not present

## 2019-07-20 DIAGNOSIS — N183 Chronic kidney disease, stage 3 unspecified: Secondary | ICD-10-CM | POA: Diagnosis not present

## 2019-07-20 DIAGNOSIS — I482 Chronic atrial fibrillation, unspecified: Secondary | ICD-10-CM | POA: Diagnosis not present

## 2019-07-20 DIAGNOSIS — E785 Hyperlipidemia, unspecified: Secondary | ICD-10-CM | POA: Diagnosis not present

## 2019-07-20 DIAGNOSIS — I4892 Unspecified atrial flutter: Secondary | ICD-10-CM | POA: Diagnosis not present

## 2019-07-20 DIAGNOSIS — I5043 Acute on chronic combined systolic (congestive) and diastolic (congestive) heart failure: Secondary | ICD-10-CM | POA: Diagnosis not present

## 2019-07-21 DIAGNOSIS — I482 Chronic atrial fibrillation, unspecified: Secondary | ICD-10-CM | POA: Diagnosis not present

## 2019-07-21 DIAGNOSIS — I4892 Unspecified atrial flutter: Secondary | ICD-10-CM | POA: Diagnosis not present

## 2019-07-21 DIAGNOSIS — N183 Chronic kidney disease, stage 3 unspecified: Secondary | ICD-10-CM | POA: Diagnosis not present

## 2019-07-21 DIAGNOSIS — I130 Hypertensive heart and chronic kidney disease with heart failure and stage 1 through stage 4 chronic kidney disease, or unspecified chronic kidney disease: Secondary | ICD-10-CM | POA: Diagnosis not present

## 2019-07-21 DIAGNOSIS — I5043 Acute on chronic combined systolic (congestive) and diastolic (congestive) heart failure: Secondary | ICD-10-CM | POA: Diagnosis not present

## 2019-07-21 DIAGNOSIS — E785 Hyperlipidemia, unspecified: Secondary | ICD-10-CM | POA: Diagnosis not present

## 2019-07-24 NOTE — Progress Notes (Signed)
Cardiology Office Note   Date:  07/25/2019   ID:  Patty [name], DOB 02-22-1930, MRN [id number]  PCP:  [name], MD    No chief complaint on file.  CAD/AFib  Wt Readings from Last 3 Encounters:  07/25/19 169 lb 12.8 oz (77 kg)  06/27/19 171 lb 3.2 oz (77.7 kg)  06/22/19 173 lb 3.2 oz (78.6 kg)       History of Present Illness: Patty [name] is a 86 y.o. female   with history of CAD s/p multiple stents, chronic combined CHF, permanent atrial fib/flutter on Coumadin with tachy-brady syndrome s/p leadless PPM (with premature battery failure no longer active - being followed conservatively for now), probable CKD III per labs, granulosa cell carcinoma, HTN, HLD, LBBB, varicose veins, renal artery stenosis, PVD, diverticulitis, OSA who presents for f/u.  She has significant CAD with stenting x 2 to the LAD and x 1 to the LCx in 2000, rotational arthrectomy to proximal LCx and DES to LAD in 2014 and DES to LCx in 2014, along with DES to LAD for -in-stent stenosis in 2015, and DES to ostial LAD in 2016. PVD is listed in chart but I cannot find further notes regarding this as LE doppler 2014 showed no evidence of significant LE disease   She was admittedin June 2018with exertional dyspnea and chest tightness and palpitations. She was found to be in rapid atrial fib/flutter and minimally elevated troponin. 2D Echo 07/11/16 showed moderate focal basal and mild concentric hypertrophy, EF 50-55%, no RWMA, septal dyssyngery c/w LBBB, mild AI, massively dilated LA, trivial TR, mild PR. Coumadin was held in prep for cath which was performed 07/14/16 showing 100% CTO of OM1 with L-L collaterals, 90% distal Cx, 100% distal PLOM, otherwise nonobstructive disease, normal LVEDP - unsuccessful PCI of the distal Cx due to inability to cross the lesion with a balloon. She was brought back 07/15/16 and underwent successful orbital atherectomy &DES to mid LCx.   She had an episode of Shingles around  Memorial Day 2019. It was on the right side of her chest. She was treated with acyclovir. She still has some itching. She had constipation and nausea. She treated this with peptobismol. She initially thought it was a heart episode, but then with the rash, realized it was shingles. She still has some trouble sleeping.   She had a lot of fatigue with the shingles. SHe had a lot of stress with her son who is an alcoholic.  He had intrabdominal bleeding in 10/19 from tumors. Anticoagulation was stopped for a while and restarted when approval was given by her oncology team.   She has been treated with chemo and has responded well. No bleeding, even after Coumadin was started.  She has had episodes of volume overload managed with diuretics and AFib RVR in early 2021.   She was seen in the ER in 5/21.  Records from Dr. Martinique show: "I spend 45 minutes in direct patient care discussing her underlying cardiac condition, treatment to date and prognosis.  From extensive evaluation to date we really have nothing else to offer her for maintenance of NSR. We may be able to control her HR better and will add digoxin 0.0625 mg daily. Continue Toprol XL. I don't feel that repeating DCCV at this time will offer any benefit. We discussed extensively managing her symptoms at home with medication. With palliative care on board she may benefit from a short acting narcotic for pain or anxiolytic  for her stress/anxiety. She is not currently volume overloaded and from her previous coronary assessment there is really nothing fixable.   I did offer her the option of readmission to the hospital and another EP consultation but she is comfortable with the idea of going home.  I really think a comprehensive palliative care approach is appropriate for her. She understands that she has a number of issues for which there is no cure and that our goals of care are more to maintain her independence and quality of life as  much as possible.  Will plan on DC from the ED today and arrange follow up with Dr Irish Lack."    SInce then, she feels well.  Denies : Chest pain. Dizziness. Leg edema. Nitroglycerin use. Orthopnea. Palpitations. Paroxysmal nocturnal dyspnea. Shortness of breath. Syncope.   HR better since Digoxin was started.  BP has been stable.  Started on Mg for low Mg level.  Received both COVID vaccines.  Her cancer treatment is on hold since the chemo risks may outweigh the benefits in regards to her heart issues.    Past Medical History:  Diagnosis Date  . Bleeding behind the abdominal cavity 10/2017  . Chronic anticoagulation - coumadin, CHADS2VASC=6 05/17/2015  . CKD (chronic kidney disease), stage III   . Combined systolic and diastolic heart failure ([place])   . Coronary artery disease    a. s/p multiple stents - stenting x 2 to the LAD and x 1 to the LCx in 2000, rotational arthrectomy to proximal LCx and DES to LAD in 2014 and DES to LCx in 2014, along with DES to LAD for re-in-stent stenosis in 2015, and DES to ostial LAD in 2016. b. 07/2016 - orbital atherectomy & DES to mid LCx.  . Diverticulitis   . Granulosa cell carcinoma ([place])    abd; last episode was in 2009  . Hyperlipidemia   . Hypertension   . LBBB (left bundle branch block)   . Myocardial infarction ([place]) 2002  . Obesity   . OSA on CPAP   . Ovarian ca ([place]) 2019  . Pacemaker failure    a. Prior leadless PPM with premature battery failure, being managed conservatively without replacement.  . Peripheral vascular disease ([place])   . Permanent atrial fibrillation ([place]) 2013  . Renal artery stenosis ([place])   . Tachycardia-bradycardia syndrome ([place])    a. s/p leadless pacemaker ([name]) implanted by Dr [name]  . Varicose veins     Past Surgical History:  Procedure Laterality Date  . ABDOMINAL HYSTERECTOMY    . CARDIAC CATHETERIZATION  09/03/2007   EF 70%; Failed attempt at PCI to OM  . CARDIAC CATHETERIZATION  11/01/2003    EF 70%  . CARDIAC CATHETERIZATION N/A 12/14/2015   Procedure: Left Heart Cath and Coronary Angiography;  Surgeon: Burnell Blanks, MD;  Location: [place] [facility];  Service: Cardiovascular;  Laterality: N/A;  . CARDIOVERSION  12/31/2011   Procedure: CARDIOVERSION;  Surgeon: [name], MD;  Location: [facility] ENDOSCOPY;  Service: Cardiovascular;  Laterality: N/A;  . CARDIOVERSION N/A 12/31/2011   Procedure: CARDIOVERSION;  Surgeon: [name], MD;  Location: [facility] CATH LAB;  Service: Cardiovascular;  Laterality: N/A;  . CARDIOVERSION N/A 11/15/2018   Procedure: CARDIOVERSION;  Surgeon: Dorothy Spark, MD;  Location: [facility] ENDOSCOPY;  Service: Cardiovascular;  Laterality: N/A;  . CARDIOVERSION N/A 06/10/2019   Procedure: CARDIOVERSION;  Surgeon: Fay Records, MD;  Location: [place];  Service: Cardiovascular;  Laterality: N/A;  .  CARDIOVERSION N/A 06/14/2019   Procedure: CARDIOVERSION;  Surgeon: Pixie Casino, MD;  Location: [place];  Service: Cardiovascular;  Laterality: N/A;  . CATARACT EXTRACTION, BILATERAL  2015  . CHOLECYSTECTOMY  1980's  . COLON SURGERY  2004   colectomy for diverticulosis  . CORONARY ANGIOPLASTY WITH STENT PLACEMENT  2000    and 08/11/2012; 11/12/2012: 3 + 2 LAD & CFX; 2nd CFX stent 11/12/2012  . CORONARY ATHERECTOMY N/A 07/15/2016   Procedure: Coronary Atherectomy;  Surgeon: Martinique, Peter M, MD;  Location: [place] [facility];  Service: Cardiovascular;  Laterality: N/A;  . CORONARY BALLOON ANGIOPLASTY N/A 07/14/2016   Procedure: Coronary Balloon Angioplasty;  Surgeon: Martinique, Peter M, MD;  Location: [place] [facility];  Service: Cardiovascular;  Laterality: N/A;  . CORONARY STENT INTERVENTION N/A 07/15/2016   Procedure: Coronary Stent Intervention;  Surgeon: Martinique, Peter M, MD;  Location: [place] [facility];  Service: Cardiovascular;  Laterality: N/A;  . FRACTIONAL FLOW RESERVE WIRE  10/07/2013   Procedure: [place];   Surgeon: [name], MD;  Location: [facility] CATH LAB;  Service: Cardiovascular;;  . HERNIA REPAIR  2005   "laparoscopic"  . IR IMAGING GUIDED PORT INSERTION  10/28/2017  . LEFT HEART CATH AND CORONARY ANGIOGRAPHY N/A 07/14/2016   Procedure: Left Heart Cath and Coronary Angiography;  Surgeon: Martinique, Peter M, MD;  Location: [place] CV LAB;  Service: Cardiovascular;  Laterality: N/A;  . LEFT HEART CATH AND CORONARY ANGIOGRAPHY N/A 11/10/2018   Procedure: LEFT HEART CATH AND CORONARY ANGIOGRAPHY;  Surgeon: Wellington Hampshire, MD;  Location: [place] [facility] LAB;  Service: Cardiovascular;  Laterality: N/A;  . LEFT HEART CATHETERIZATION WITH CORONARY ANGIOGRAM N/A 11/12/2012   Procedure: LEFT HEART CATHETERIZATION WITH CORONARY ANGIOGRAM;  Surgeon: [name], MD;  Location: [facility] CATH LAB;  Service: Cardiovascular;  Laterality: N/A;  . LEFT HEART CATHETERIZATION WITH CORONARY ANGIOGRAM N/A 10/07/2013   Procedure: LEFT HEART CATHETERIZATION WITH CORONARY ANGIOGRAM;  Surgeon: [name], MD;  Location: [facility] CATH LAB;  Service: Cardiovascular;  Laterality: N/A;  . LEFT HEART CATHETERIZATION WITH CORONARY ANGIOGRAM N/A 12/14/2013   Procedure: LEFT HEART CATHETERIZATION WITH CORONARY ANGIOGRAM;  Surgeon: Sinclair Grooms, MD;  Location: [facility] CATH LAB;  Service: Cardiovascular;  Laterality: N/A;  . LEFT HEART CATHETERIZATION WITH CORONARY ANGIOGRAM N/A 05/16/2014   Procedure: LEFT HEART CATHETERIZATION WITH CORONARY ANGIOGRAM;  Surgeon: Sherren Mocha, MD;  Location: [facility] CATH LAB;  Service: Cardiovascular;  Laterality: N/A;  . PERCUTANEOUS CORONARY INTERVENTION-BALLOON ONLY  08/04/2012   Procedure: PERCUTANEOUS CORONARY INTERVENTION-BALLOON ONLY;  Surgeon: [name], MD;  Location: [facility] CATH LAB;  Service: Cardiovascular;;  . PERCUTANEOUS CORONARY ROTOBLATOR INTERVENTION (PCI-R) N/A 08/11/2012   Procedure: PERCUTANEOUS CORONARY ROTOBLATOR INTERVENTION (PCI-R);  Surgeon: [name],  MD;  Location: [facility] CATH LAB;  Service: Cardiovascular;  Laterality: N/A;  . PERCUTANEOUS CORONARY STENT INTERVENTION (PCI-S)  10/07/2013   Procedure: PERCUTANEOUS CORONARY STENT INTERVENTION (PCI-S);  Surgeon: [name], MD;  Location: [facility] CATH LAB;  Service: Cardiovascular;;  . PERMANENT PACEMAKER INSERTION N/A 03/16/2012   [name] (SJM) leadless pacemaker (LEADLESS II STUDY PATEINT)  . SALIVARY GLAND SURGERY  2000's   "had a little lump removed; granulosa related; it was benign" (08/11/2012)  . TEE WITHOUT CARDIOVERSION  12/31/2011   Procedure: TRANSESOPHAGEAL ECHOCARDIOGRAM (TEE);  Surgeon: [name], MD;  Location: [facility] ENDOSCOPY;  Service: Cardiovascular;  Laterality: N/A;  . UMBILICAL GRANULOMA EXCISION  2000   2003; 2004;  2007: "all in my abdomen including small intestines, outside my ?uterus/etc" (08/11/2012)  . VARICOSE VEIN SURGERY Bilateral 1977     Current Outpatient Medications  Medication Sig Dispense Refill  . acetaminophen (TYLENOL) 500 MG tablet Take 500 mg by mouth every 6 (six) hours as needed for mild pain or headache.     Marland Kitchen atorvastatin (LIPITOR) 80 MG tablet TAKE 1 TABLET BY MOUTH EVERY DAY IN THE MORNING 90 tablet 3  . beta carotene w/minerals (OCUVITE) tablet Take 1 tablet by mouth 2 (two) times daily.     . clopidogrel (PLAVIX) 75 MG tablet TAKE 1 TABLET BY MOUTH EVERY DAY 30 tablet 11  . Coenzyme Q-10 100 MG capsule Take 200 mg by mouth at bedtime.     . digoxin 62.5 MCG TABS Take 0.0625 mg by mouth daily. 30 tablet 0  . dofetilide (TIKOSYN) 125 MCG capsule TAKE 1 CAPSULE BY MOUTH TWICE A DAY 60 capsule 6  . furosemide (LASIX) 40 MG tablet Take 2 tablets by mouth every morning and 2 additional tabs as needed for wt gain/abdominal distension 180 tablet 2  . gabapentin (NEURONTIN) 300 MG capsule TAKE 1 CAPSULE BY MOUTH TWICE A DAY 60 capsule 1  . isosorbide mononitrate (IMDUR) 60 MG 24 hr tablet Take 1.5 tablets (90 mg total) by mouth daily. 45 tablet 3    . magnesium oxide (MAG-OX) 400 (241.3 Mg) MG tablet Take 2 tablets (800 mg total) by mouth daily. 60 tablet 6  . metoprolol (TOPROL-XL) 200 MG 24 hr tablet Take 1 tablet (200 mg total) by mouth daily. Take with or immediately following a meal. 30 tablet 6  . Multiple Vitamin (MULTIVITAMIN) capsule Take 1 capsule by mouth daily.    . nitroGLYCERIN (NITROSTAT) 0.4 MG SL tablet PLACE 1 TABLET (0.4 MG TOTAL) UNDER THE TONGUE EVERY 5 (FIVE) MINUTES AS NEEDED FOR CHEST PAIN 75 tablet 2  . potassium chloride SA (KLOR-CON) 20 MEQ tablet Take 1 tablet (20 mEq total) by mouth 2 (two) times daily. 180 tablet 3  . rivaroxaban (XARELTO) 20 MG TABS tablet Take 1 tablet (20 mg total) by mouth daily with supper. 30 tablet 0   No current facility-administered medications for this visit.    Allergies:   Bee venom, Other, Oxycodone hcl, Amiodarone, Chlorhexidine, and Prednisone    Social History:  The patient  reports that she quit smoking about 45 years ago. Her smoking use included cigarettes. She has a 32.00 pack-year smoking history. She has never used smokeless tobacco. She reports current alcohol use of about 7.0 standard drinks of alcohol per week. She reports that she does not use drugs.   Family History:  The patient's family history includes Diabetes in her brother and father; Heart attack in her brother and father; Hypertension in her brother and father; Kidney failure in her brother; Stroke in her mother.    ROS:  Please see the history of present illness.   Otherwise, review of systems are positive for recent headaches when Imdur was increased.   All other systems are reviewed and negative.    PHYSICAL EXAM: VS:  BP 134/60   Pulse 81   Ht 5' (1.524 m)   Wt 169 lb 12.8 oz (77 kg)   SpO2 95%   BMI 33.16 kg/m  , BMI Body mass index is 33.16 kg/m. GEN: Well nourished, well developed, in no acute distress  HEENT: normal  Neck: no JVD, carotid bruits, or masses Cardiac: irregularly irregular;  no murmurs, rubs,  or gallops,no edema  Respiratory:  clear to auscultation bilaterally, normal work of breathing GI: soft, nontender, nondistended, + BS MS: no deformity or atrophy  Skin: warm and dry, no rash Neuro:  Strength and sensation are intact Psych: euthymic mood, full affect   EKG:   The ekg ordered 5/24/21demonstrates AFlutter, LBBB   Recent Labs: 10/08/2018: TSH 2.089 03/22/2019: NT-Pro BNP 3,420 05/31/2019: ALT 22 06/09/2019: B Natriuretic Peptide 1,092.6 06/22/2019: Magnesium 2.0 06/27/2019: BUN 22; Creatinine, Ser 1.20; Hemoglobin 12.2; Platelets 167; Potassium 3.6; Sodium 140   Lipid Panel    Component Value Date/Time   CHOL 112 11/10/2018 0536   CHOL 132 10/08/2017 0905   TRIG 69 11/10/2018 0536   HDL 36 (L) 11/10/2018 0536   HDL 66 10/08/2017 0905   CHOLHDL 3.1 11/10/2018 0536   VLDL 14 11/10/2018 0536   LDLCALC 62 11/10/2018 0536   LDLCALC 52 10/08/2017 0905     Other studies Reviewed: Additional studies/ records that were reviewed today with results demonstrating: labs from 06/27/19 and 10/20 reviewed. .   ASSESSMENT AND PLAN:  1. CAD: No angina on medical therapy.  Continue aggressive secondary prevention.  2. AFib: Rate controlled. Continue current meds. Xarelto for stroke prevention.  Take with largest meal of the day.   Will ask Dr. Delfina Redwood to do ECG at his visit in a few weeks. 3. Chronic diastolic heart failure: She appears to be euvolemic. 4. S/p pacer: Doing well.  Followed by EP.  5. CKD stage III: Stable.  6. HTN: The current medical regimen is effective;  continue present plan and medications. 7. Hyperlipidemia: LDL 62. Continue statin.  8. She very much appreciated the goals of care discussion with Dr. Martinique.  She still has [facility] for now. She was signed up for palliative care.  Her anxiety is significantly less..    Current medicines are reviewed at length with the patient today.  The patient concerns regarding her medicines were  addressed.  The following changes have been made:  No change  Labs/ tests ordered today include:  No orders of the defined types were placed in this encounter.   Recommend 150 minutes/week of aerobic exercise Low fat, low carb, high fiber diet recommended  Disposition:   FU in 4 months   Signed, Larae Grooms, MD  07/25/2019 2:26 PM    [facility] [address], [place], [place]  [address] Phone: [phone]; Fax: [phone]

## 2019-07-25 ENCOUNTER — Ambulatory Visit (INDEPENDENT_AMBULATORY_CARE_PROVIDER_SITE_OTHER): Payer: Medicare Other | Admitting: Interventional Cardiology

## 2019-07-25 ENCOUNTER — Encounter: Payer: Self-pay | Admitting: Interventional Cardiology

## 2019-07-25 ENCOUNTER — Other Ambulatory Visit: Payer: Self-pay

## 2019-07-25 VITALS — BP 134/60 | HR 81 | Ht 60.0 in | Wt 169.8 lb

## 2019-07-25 DIAGNOSIS — N183 Chronic kidney disease, stage 3 unspecified: Secondary | ICD-10-CM

## 2019-07-25 DIAGNOSIS — I200 Unstable angina: Secondary | ICD-10-CM

## 2019-07-25 DIAGNOSIS — E782 Mixed hyperlipidemia: Secondary | ICD-10-CM

## 2019-07-25 DIAGNOSIS — I5042 Chronic combined systolic (congestive) and diastolic (congestive) heart failure: Secondary | ICD-10-CM

## 2019-07-25 DIAGNOSIS — I482 Chronic atrial fibrillation, unspecified: Secondary | ICD-10-CM | POA: Diagnosis not present

## 2019-07-25 DIAGNOSIS — I25118 Atherosclerotic heart disease of native coronary artery with other forms of angina pectoris: Secondary | ICD-10-CM | POA: Diagnosis not present

## 2019-07-25 DIAGNOSIS — E785 Hyperlipidemia, unspecified: Secondary | ICD-10-CM | POA: Diagnosis not present

## 2019-07-25 DIAGNOSIS — I130 Hypertensive heart and chronic kidney disease with heart failure and stage 1 through stage 4 chronic kidney disease, or unspecified chronic kidney disease: Secondary | ICD-10-CM | POA: Diagnosis not present

## 2019-07-25 DIAGNOSIS — I4891 Unspecified atrial fibrillation: Secondary | ICD-10-CM | POA: Diagnosis not present

## 2019-07-25 DIAGNOSIS — I10 Essential (primary) hypertension: Secondary | ICD-10-CM

## 2019-07-25 DIAGNOSIS — I4892 Unspecified atrial flutter: Secondary | ICD-10-CM | POA: Diagnosis not present

## 2019-07-25 DIAGNOSIS — I5043 Acute on chronic combined systolic (congestive) and diastolic (congestive) heart failure: Secondary | ICD-10-CM | POA: Diagnosis not present

## 2019-07-25 NOTE — Patient Instructions (Signed)
Medication Instructions:  Your physician recommends that you continue on your current medications as directed. Please refer to the Current Medication list given to you today.  *If you need a refill on your cardiac medications before your next appointment, please call your pharmacy*   Lab Work: None  If you have labs (blood work) drawn today and your tests are completely normal, you will receive your results only by: Marland Kitchen MyChart Message (if you have MyChart) OR . A paper copy in the mail If you have any lab test that is abnormal or we need to change your treatment, we will call you to review the results.   Testing/Procedures: None   Follow-Up: At [facility], you and your health needs are our priority.  As part of our continuing mission to provide you with exceptional heart care, we have created designated Provider Care Teams.  These Care Teams include your primary Cardiologist (physician) and Advanced Practice Providers (APPs -  Physician Assistants and Nurse Practitioners) who all work together to provide you with the care you need, when you need it.  We recommend signing up for the patient portal called "MyChart".  Sign up information is provided on this After Visit Summary.  MyChart is used to connect with patients for Virtual Visits (Telemedicine).  Patients are able to view lab/test results, encounter notes, upcoming appointments, etc.  Non-urgent messages can be sent to your provider as well.   To learn more about what you can do with MyChart, go to [email or web address].    Your next appointment:   4 month(s)  The format for your next appointment:   In Person  Provider:   You may see Larae Grooms, MD or one of the following Advanced Practice Providers on your designated Care Team:    Melina Copa, PA-C  Ermalinda Barrios, PA-C    Other Instructions None

## 2019-07-26 ENCOUNTER — Telehealth: Payer: Self-pay | Admitting: Interventional Cardiology

## 2019-07-26 MED ORDER — DIGOXIN 62.5 MCG PO TABS: 0.0625 mg | ORAL_TABLET | Freq: Every day | ORAL | 1 refills | Status: DC

## 2019-07-26 NOTE — Telephone Encounter (Signed)
*  STAT* If patient is at the pharmacy, call can be transferred to refill team.   1. Which medications need to be refilled? (please list name of each medication and dose if known) digoxin 62.5 MCG TABS  2. Which pharmacy/location (including street and city if local pharmacy) is medication to be sent to? [id number] - [place], [place] [address]  3. Do they need a 30 day or 90 day supply? [place]

## 2019-07-26 NOTE — Telephone Encounter (Signed)
**Note De-Identified Patty [name] Obfuscation** The pt is advised that [facility]. She thanked me for calling with update.

## 2019-07-27 NOTE — Progress Notes (Signed)
Subjective: Patty [name] is a pleasant 86 y.o. female patient seen today with h/o chemotherapy induced neuropathy,  long term blood thinner, Xarelto, and presents today with painful, discolored, thick toenails and calluses which interfere with daily activities. Pain is relieved with periodic professional debridement.  She voices no new pedal problems on today's visit.  Past Medical History:  Diagnosis Date   Bleeding behind the abdominal cavity 10/2017   Chronic anticoagulation - coumadin, CHADS2VASC=6 05/17/2015   CKD (chronic kidney disease), stage III    Combined systolic and diastolic heart failure ([name])    Coronary artery disease    a. s/p multiple stents - stenting x 2 to the LAD and x 1 to the LCx in 2000, rotational arthrectomy to proximal LCx and DES to LAD in 2014 and DES to LCx in 2014, along with DES to LAD for re-in-stent stenosis in 2015, and DES to ostial LAD in 2016. b. 07/2016 - orbital atherectomy & DES to mid LCx.   Diverticulitis    Granulosa cell carcinoma ([name])    abd; last episode was in 2009   Hyperlipidemia    Hypertension    LBBB (left bundle branch block)    Myocardial infarction ([name]) 2002   Obesity    OSA on CPAP    Ovarian ca [facility]) 2019   Pacemaker failure    a. Prior leadless PPM with premature battery failure, being managed conservatively without replacement.   Peripheral vascular disease ([place])    Permanent atrial fibrillation ([name]) 2013   Renal artery stenosis ([name])    Tachycardia-bradycardia syndrome ([name])    a. s/p leadless pacemaker ([name]) implanted by Dr [name]   Varicose veins     Patient Active Problem List   Diagnosis Date Noted   Under care of palliative care physician    ACS (acute coronary syndrome) ([name]) 06/09/2019   Heart failure ([name]) 05/29/2019   Atrial fibrillation, chronic ([name]) 05/28/2019   Stable angina ([name]) 10/09/2018   Coagulopathy ([name]) 10/09/2018   Chronic combined systolic (congestive)  and diastolic (congestive) heart failure ([name]) 10/08/2018   Peripheral neuropathy due to chemotherapy ([name]) 11/19/2017   Pancytopenia, acquired ([name]) 11/19/2017   Acute on chronic combined systolic (congestive) and diastolic (congestive) heart failure ([name]) 11/03/2017   Hyperglycemia 11/03/2017   Protein-calorie malnutrition, moderate ([place]) 10/28/2017   Other constipation 10/20/2017   Postherpetic neuralgia 10/20/2017   Cancer associated pain 10/20/2017   Physical debility 10/20/2017   Goals of care, counseling/discussion 10/20/2017   Ovarian cancer ([name]) 10/19/2017   Intraperitoneal hemorrhage    Abdominal pain 10/12/2017   Pain of joint of left ankle and foot 08/03/2017   Persistent atrial fibrillation    Warfarin anticoagulation    DOE (dyspnea on exertion)    Status post coronary artery stent placement    Normocytic anemia 07/11/2016   History of small bowel obstruction 12/22/2015   Permanent atrial fibrillation with RVR 12/15/2015   Demand ischemia ([place]) 12/15/2015   NSTEMI (non-ST elevated myocardial infarction) ([name]) 05/18/2015   Chronic anticoagulation - coumadin, CHADS2VASC=6 05/17/2015   Unstable angina ([name]) 05/17/2015   Swelling of lower extremity 04/18/2015   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[name]) 06/16/2014   Ischemic chest pain ([name])    OSA on CPAP 05/14/2014   Chest pain 01/05/2014   Atrial flutter ([name])    Hypokalemia    Pacemaker - [employer] Leadless PPM    Combined systolic and diastolic heart failure, NYHA class 3 ([name])    Tachycardia-bradycardia syndrome (  [name]) 09/30/2012   Atrial fibrillation with RVR ([name]) 12/28/2011   Ventral hernia 10/16/2011   Granulosa cell tumor of ovary 04/23/2011   CAD (coronary artery disease) 10/03/2010   Essential hypertension 10/03/2010   Hyperlipidemia 10/03/2010   Renal artery stenosis ([name]) 10/03/2010   Obesity (BMI 30.0-34.9) 10/03/2010    Current Outpatient Medications  on File Prior to Visit  Medication Sig Dispense Refill   acetaminophen (TYLENOL) 500 MG tablet Take 500 mg by mouth every 6 (six) hours as needed for mild pain or headache.      atorvastatin (LIPITOR) 80 MG tablet TAKE 1 TABLET BY MOUTH EVERY DAY IN THE MORNING 90 tablet 3   beta carotene w/minerals (OCUVITE) tablet Take 1 tablet by mouth 2 (two) times daily.      clopidogrel (PLAVIX) 75 MG tablet TAKE 1 TABLET BY MOUTH EVERY DAY 30 tablet 11   Coenzyme Q-10 100 MG capsule Take 200 mg by mouth at bedtime.      dofetilide (TIKOSYN) 125 MCG capsule TAKE 1 CAPSULE BY MOUTH TWICE A DAY 60 capsule 6   furosemide (LASIX) 40 MG tablet Take 2 tablets by mouth every morning and 2 additional tabs as needed for wt gain/abdominal distension 180 tablet 2   gabapentin (NEURONTIN) 300 MG capsule TAKE 1 CAPSULE BY MOUTH TWICE A DAY 60 capsule 1   isosorbide mononitrate (IMDUR) 60 MG 24 hr tablet Take 1.5 tablets (90 mg total) by mouth daily. 45 tablet 3   magnesium oxide (MAG-OX) 400 (241.3 Mg) MG tablet Take 2 tablets (800 mg total) by mouth daily. 60 tablet 6   metoprolol (TOPROL-XL) 200 MG 24 hr tablet Take 1 tablet (200 mg total) by mouth daily. Take with or immediately following a meal. 30 tablet 6   Multiple Vitamin (MULTIVITAMIN) capsule Take 1 capsule by mouth daily.     nitroGLYCERIN (NITROSTAT) 0.4 MG SL tablet PLACE 1 TABLET (0.4 MG TOTAL) UNDER THE TONGUE EVERY 5 (FIVE) MINUTES AS NEEDED FOR CHEST PAIN 75 tablet 2   potassium chloride SA (KLOR-CON) 20 MEQ tablet Take 1 tablet (20 mEq total) by mouth 2 (two) times daily. 180 tablet 3   rivaroxaban (XARELTO) 20 MG TABS tablet Take 1 tablet (20 mg total) by mouth daily with supper. 30 tablet 0   No current facility-administered medications on file prior to visit.    Allergies  Allergen Reactions   Bee Venom Anaphylaxis   Other Nausea And Vomiting and Other (See Comments)    Pain medications cause severe vomiting. Tolerated slow IV  morphine drip   Oxycodone Hcl Nausea And Vomiting   Amiodarone Nausea Only   Chlorhexidine    Prednisone Palpitations and Other (See Comments)    "Rapid Heart Beat"    Objective: Physical Exam  General: Brecklyn Galvis Kendrick is a pleasant 86 y.o. Caucasian female, WD, WN in NAD. AAO x 3.   Vascular:  Neurovascular status unchanged b/l lower extremities. Capillary refill time to digits immediate b/l. Palpable pedal pulses b/l LE. Pedal hair absent. Lower extremity skin temperature gradient within normal limits.  Dermatological:  Pedal skin with normal turgor, texture and tone bilaterally. No open wounds bilaterally. No interdigital macerations bilaterally. Toenails 1-5 b/l elongated, discolored, dystrophic, thickened, crumbly with subungual debris and tenderness to dorsal palpation. Hyperkeratotic lesion(s) L hallux, R hallux and submet head 1 left foot.  No erythema, no edema, no drainage, no flocculence.  Musculoskeletal:  Normal muscle strength 5/5 to all lower extremity muscle groups bilaterally. No pain  crepitus or joint limitation noted with ROM b/l. Hallux valgus with bunion deformity noted b/l lower extremities.  Neurological:  Protective sensation intact 5/5 intact bilaterally with 10g monofilament b/l. Vibratory sensation intact b/l.  Assessment and Plan:  1. Pain due to onychomycosis of toenail   2. Callus   3. Peripheral neuropathy due to chemotherapy ([place])   4. Coagulopathy ([place])    -Examined patient. -No new findings. No new orders. -Toenails 1-5 b/l were debrided in length and girth with sterile nail nippers and dremel without iatrogenic bleeding.  -Callus(es) L hallux, R hallux and submet head 1 right foot pared utilizing sterile scalpel blade without complication or incident. Total number debrided =3. -Patient to report any pedal injuries to medical professional immediately. -Patient to continue soft, supportive shoe gear daily. -Patient/POA to call should there be  question/concern in the interim.  Return in about 9 weeks (around 09/20/2019) for nail and callus trim/ Plavix.  Marzetta Board, DPM

## 2019-07-28 DIAGNOSIS — E785 Hyperlipidemia, unspecified: Secondary | ICD-10-CM | POA: Diagnosis not present

## 2019-07-28 DIAGNOSIS — N183 Chronic kidney disease, stage 3 unspecified: Secondary | ICD-10-CM | POA: Diagnosis not present

## 2019-07-28 DIAGNOSIS — I482 Chronic atrial fibrillation, unspecified: Secondary | ICD-10-CM | POA: Diagnosis not present

## 2019-07-28 DIAGNOSIS — I5043 Acute on chronic combined systolic (congestive) and diastolic (congestive) heart failure: Secondary | ICD-10-CM | POA: Diagnosis not present

## 2019-07-28 DIAGNOSIS — I130 Hypertensive heart and chronic kidney disease with heart failure and stage 1 through stage 4 chronic kidney disease, or unspecified chronic kidney disease: Secondary | ICD-10-CM | POA: Diagnosis not present

## 2019-07-28 DIAGNOSIS — I4892 Unspecified atrial flutter: Secondary | ICD-10-CM | POA: Diagnosis not present

## 2019-08-03 DIAGNOSIS — E785 Hyperlipidemia, unspecified: Secondary | ICD-10-CM | POA: Diagnosis not present

## 2019-08-03 DIAGNOSIS — I130 Hypertensive heart and chronic kidney disease with heart failure and stage 1 through stage 4 chronic kidney disease, or unspecified chronic kidney disease: Secondary | ICD-10-CM | POA: Diagnosis not present

## 2019-08-03 DIAGNOSIS — N183 Chronic kidney disease, stage 3 unspecified: Secondary | ICD-10-CM | POA: Diagnosis not present

## 2019-08-03 DIAGNOSIS — I4892 Unspecified atrial flutter: Secondary | ICD-10-CM | POA: Diagnosis not present

## 2019-08-03 DIAGNOSIS — I482 Chronic atrial fibrillation, unspecified: Secondary | ICD-10-CM | POA: Diagnosis not present

## 2019-08-03 DIAGNOSIS — I5043 Acute on chronic combined systolic (congestive) and diastolic (congestive) heart failure: Secondary | ICD-10-CM | POA: Diagnosis not present

## 2019-08-04 DIAGNOSIS — N183 Chronic kidney disease, stage 3 unspecified: Secondary | ICD-10-CM | POA: Diagnosis not present

## 2019-08-04 DIAGNOSIS — I130 Hypertensive heart and chronic kidney disease with heart failure and stage 1 through stage 4 chronic kidney disease, or unspecified chronic kidney disease: Secondary | ICD-10-CM | POA: Diagnosis not present

## 2019-08-04 DIAGNOSIS — I482 Chronic atrial fibrillation, unspecified: Secondary | ICD-10-CM | POA: Diagnosis not present

## 2019-08-04 DIAGNOSIS — I4892 Unspecified atrial flutter: Secondary | ICD-10-CM | POA: Diagnosis not present

## 2019-08-04 DIAGNOSIS — E785 Hyperlipidemia, unspecified: Secondary | ICD-10-CM | POA: Diagnosis not present

## 2019-08-04 DIAGNOSIS — I5043 Acute on chronic combined systolic (congestive) and diastolic (congestive) heart failure: Secondary | ICD-10-CM | POA: Diagnosis not present

## 2019-08-06 ENCOUNTER — Other Ambulatory Visit: Payer: Self-pay | Admitting: Interventional Cardiology

## 2019-08-07 ENCOUNTER — Other Ambulatory Visit: Payer: Self-pay | Admitting: Hematology and Oncology

## 2019-08-09 ENCOUNTER — Ambulatory Visit (INDEPENDENT_AMBULATORY_CARE_PROVIDER_SITE_OTHER): Payer: Medicare Other | Admitting: *Deleted

## 2019-08-09 DIAGNOSIS — I495 Sick sinus syndrome: Secondary | ICD-10-CM

## 2019-08-09 LAB — CUP PACEART REMOTE DEVICE CHECK
Battery Remaining Longevity: 124 mo
Battery Remaining Percentage: 95.5 %
Battery Voltage: 3.01 V
Brady Statistic AP VP Percent: 1.3 %
Brady Statistic AP VS Percent: 22 %
Brady Statistic AS VP Percent: 23 %
Brady Statistic AS VS Percent: 54 %
Brady Statistic RA Percent Paced: 23 %
Brady Statistic RV Percent Paced: 24 %
Date Time Interrogation Session: 20210706023207
Implantable Lead Implant Date: 20181224
Implantable Lead Implant Date: 20181224
Implantable Lead Location: 753859
Implantable Lead Location: 753860
Implantable Pulse Generator Implant Date: 20181224
Lead Channel Impedance Value: 460 Ohm
Lead Channel Impedance Value: 850 Ohm
Lead Channel Pacing Threshold Amplitude: 0.75 V
Lead Channel Pacing Threshold Amplitude: 0.75 V
Lead Channel Pacing Threshold Pulse Width: 0.4 ms
Lead Channel Pacing Threshold Pulse Width: 0.4 ms
Lead Channel Sensing Intrinsic Amplitude: 5 mV
Lead Channel Sensing Intrinsic Amplitude: 6.3 mV
Lead Channel Setting Pacing Amplitude: 2.5 V
Lead Channel Setting Pacing Amplitude: 2.5 V
Lead Channel Setting Pacing Pulse Width: 0.4 ms
Lead Channel Setting Sensing Sensitivity: 2 mV
Pulse Gen Model: 2272
Pulse Gen Serial Number: 8968200

## 2019-08-10 NOTE — Progress Notes (Signed)
Remote pacemaker transmission.   

## 2019-08-11 ENCOUNTER — Telehealth: Payer: Self-pay | Admitting: Interventional Cardiology

## 2019-08-11 NOTE — Telephone Encounter (Signed)
Pt c/o medication issue:  1. Name of Medication: Imodium  2. How are you currently taking this medication (dosage and times per day)? n/a  3. Are you having a reaction (difficulty breathing--STAT)? no  4. What is your medication issue? Almyra Free with [facility] wants to make sure it is safe for the patient to take this medication.

## 2019-08-11 NOTE — Telephone Encounter (Signed)
Left message to call back  

## 2019-08-12 NOTE — Telephone Encounter (Signed)
Patty [name] from [facility] returning call.

## 2019-08-12 NOTE — Telephone Encounter (Signed)
Spoke with Almyra Free with [facility] who states pt is requesting to take imodium for soft stool. States pt denies that stools are numerous, runny, or prevent her from her ADLs. I advised that pt should only use Imodium for episodes of extreme diarrhea and that it should be avoided by pt that take dofetilide due to potential for QT prolongation. Almyra Free verbalized understanding and thanked me for the call.

## 2019-08-12 NOTE — Telephone Encounter (Signed)
Left message to call back  

## 2019-08-13 ENCOUNTER — Other Ambulatory Visit: Payer: Self-pay | Admitting: Interventional Cardiology

## 2019-08-13 DIAGNOSIS — I4891 Unspecified atrial fibrillation: Secondary | ICD-10-CM

## 2019-08-15 ENCOUNTER — Other Ambulatory Visit: Payer: Self-pay

## 2019-08-15 ENCOUNTER — Other Ambulatory Visit: Payer: Medicare Other | Admitting: *Deleted

## 2019-08-15 DIAGNOSIS — I4891 Unspecified atrial fibrillation: Secondary | ICD-10-CM

## 2019-08-15 NOTE — Telephone Encounter (Signed)
Discussed with Rutha Bouchard D, Will call pt to see if she can come in for blood work to make sure she is on the correct dose.

## 2019-08-15 NOTE — Telephone Encounter (Signed)
Called the pt and she stated that she has 2 pills of Xarelto 20mg  left so she will not have anymore after tonight and tomorrow's dose. Advised that since she had the labs drawn today we should have the results tomorrow to ensure correct dosing and that we will call her with any changes in the Xarelto dose if any. She verbalized understanding and please not that she is not an early riser.  Pt does not get up early but is up around 830am and tomorrow she will be out the house at 130pm tomorrow for a couple hours.

## 2019-08-15 NOTE — Telephone Encounter (Signed)
Attempted to call pt back.  °

## 2019-08-15 NOTE — Telephone Encounter (Signed)
    New message    Patty [name] is requesting a call from [place] regarding the dosage amount for Xarelto. She stated that she's down to 2 pills and doesn't want to get a refill if the dosage will be changing. She would like a call [phone] as soon as possible before she has to get a refill.

## 2019-08-15 NOTE — Telephone Encounter (Signed)
Prescription refill request for Xarelto received.   Last office visit: [place], 07/25/2019 Weight: 77 kg Age: 86 y.o. Scr: 1.20, 06/27/2019 CrCl: 40 ml/min   Possible dose decrease of Xarelto.

## 2019-08-16 LAB — CBC
Hematocrit: 39.7 % (ref 34.0–46.6)
Hemoglobin: 12.8 g/dL (ref 11.1–15.9)
MCH: 31.5 pg (ref 26.6–33.0)
MCHC: 32.2 g/dL (ref 31.5–35.7)
MCV: 98 fL — ABNORMAL HIGH (ref 79–97)
Platelets: 196 10*3/uL (ref 150–450)
RBC: 4.06 x10E6/uL (ref 3.77–5.28)
RDW: 13.5 % (ref 11.7–15.4)
WBC: 4.9 10*3/uL (ref 3.4–10.8)

## 2019-08-16 LAB — BASIC METABOLIC PANEL
BUN/Creatinine Ratio: 20 (ref 12–28)
BUN: 22 mg/dL (ref 8–27)
CO2: 32 mmol/L — ABNORMAL HIGH (ref 20–29)
Calcium: 8.6 mg/dL — ABNORMAL LOW (ref 8.7–10.3)
Chloride: 102 mmol/L (ref 96–106)
Creatinine, Ser: 1.1 mg/dL — ABNORMAL HIGH (ref 0.57–1.00)
GFR calc Af Amer: 52 mL/min/{1.73_m2} — ABNORMAL LOW (ref >59)
GFR calc non Af Amer: 45 mL/min/{1.73_m2} — ABNORMAL LOW (ref >59)
Glucose: 101 mg/dL — ABNORMAL HIGH (ref 65–99)
Potassium: 3.9 mmol/L (ref 3.5–5.2)
Sodium: 145 mmol/L — ABNORMAL HIGH (ref 134–144)

## 2019-08-16 MED ORDER — RIVAROXABAN 15 MG PO TABS: 15.0000 mg | ORAL_TABLET | Freq: Every day | ORAL | 5 refills | Status: DC

## 2019-08-16 NOTE — Telephone Encounter (Signed)
Sent a message to Dr. Irish Lack regarding CrCl/dose change.

## 2019-08-16 NOTE — Telephone Encounter (Signed)
Pt only has 1 dosage left for current Xarelto rx.  Discussed with Marcelle Overlie, pharmacist to prevent disruption in pt's Xarelto prescription authorized dosage change based on CrCl to 15mg  once daily.  Removed Xarelto 20mg  tablets from pt's medication list and sent in a new rx for Xarelto 15mg  QD.  Called and advised pt new rx has been sent to pharmacy.

## 2019-08-16 NOTE — Telephone Encounter (Signed)
Spoke with [facility] Pharm[facility] and since we have not heard back from Dr. Irish Lack, dose was changed to Xarelto 15mg . Pt was updated as well and very appreciative.

## 2019-08-16 NOTE — Telephone Encounter (Signed)
Called the pt to update her and she is aware that we have sent a message to Dr. Irish Lack as the pt needs a dose change, she appreciated the update.

## 2019-08-16 NOTE — Telephone Encounter (Signed)
Last labs 07/16/19 Creat 1.10, age 86, weight 77kg, CrCl 43.8, based on specifed criteria pt is not on appropriate dosage of Xarelto.

## 2019-08-23 ENCOUNTER — Encounter: Payer: Self-pay | Admitting: *Deleted

## 2019-08-23 NOTE — Telephone Encounter (Signed)
[name], MD  Taneya Conkel, Myrtis Hopping, RN; Drue Novel I, RN OK to change to appropriate dose.   JV       Previous Messages   ----- Message -----  From: Patty Eke, RN  Sent: 08/16/2019  8:47 AM EDT  To: [name], MD   Greetings,  The pt has requested a refill on her Xarelto 20mg , however, she had to have labs done yesterday. She is 86 yrs old, wt-77kg, Crea-1.10 from yesterday, CrCl-43.5ml/min. Also, in May 2021 the pt's CrCl was 44ml/min. Per dosing criteria she is currently on the incorrect dose. Please advise and Thanks.

## 2019-08-23 NOTE — Telephone Encounter (Signed)
This encounter was created in error - please disregard.

## 2019-08-27 ENCOUNTER — Other Ambulatory Visit (HOSPITAL_COMMUNITY): Payer: Self-pay | Admitting: Nurse Practitioner

## 2019-09-04 IMAGING — CT CT ABDOMEN AND PELVIS WITH CONTRAST
2 of 5 series · 16 of 46 positions shown, 18 images · IV contrast (OMNIPAQUE)
Comparison: 04/22/2018.

CLINICAL DATA: Granulosa cell tumor of the ovary. Chemotherapy
complete. Chronic shortness of breath. Palpable right lower quadrant
mass with pain for 10 days.

EXAM:
CT ABDOMEN AND PELVIS WITH CONTRAST
TECHNIQUE: Multidetector CT imaging of the abdomen and pelvis was performed
using the standard protocol following bolus administration of
intravenous contrast.
CONTRAST:  100mL OMNIPAQUE IOHEXOL 300 MG/ML  SOLN

[Series 2: axial st · axial · 0.79mm/px · z∈[-692,-307]mm · 13 of 89 slices shown, 15 images]
[im 6/89  soft-tissue]
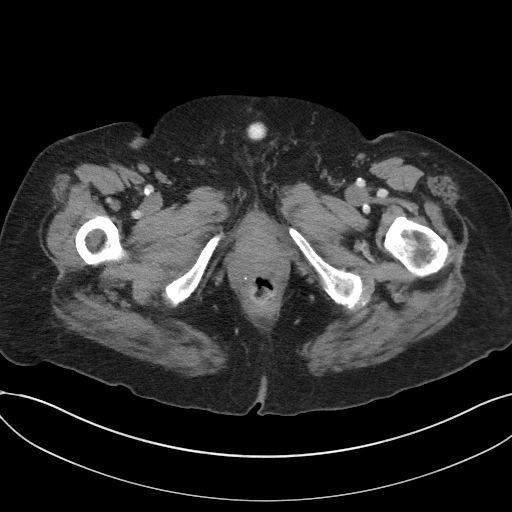
[im 6/89  bone]
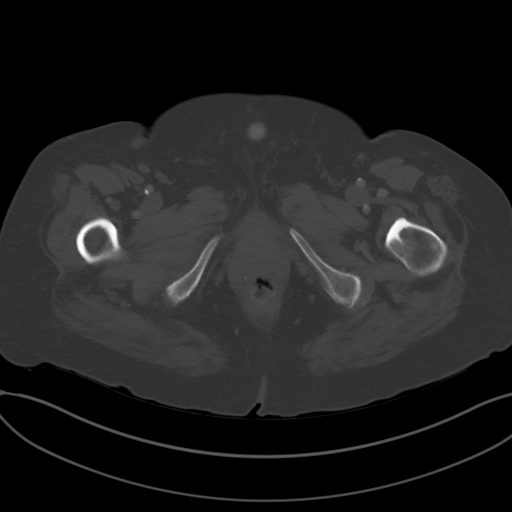
[im 12/89  soft-tissue]
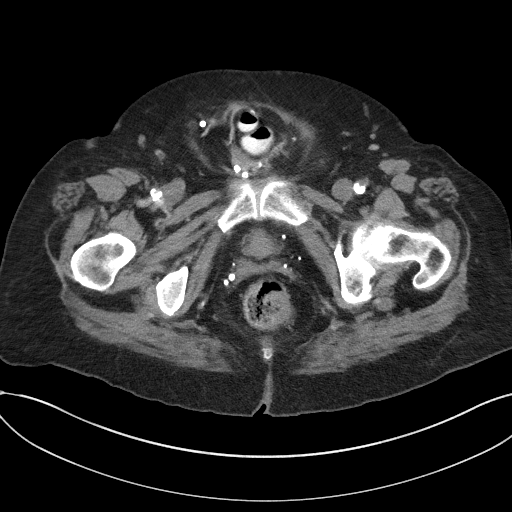
[im 18/89  soft-tissue]
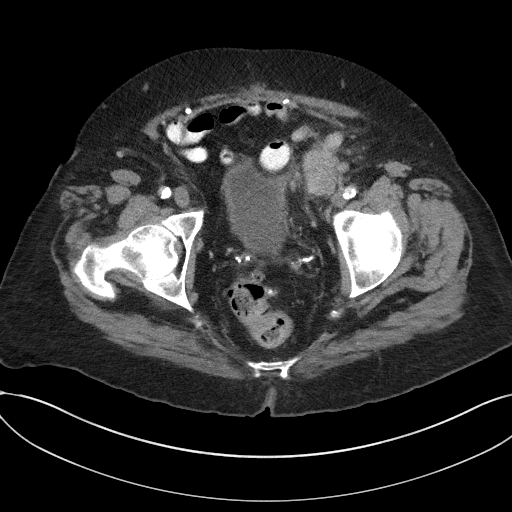
[im 24/89  soft-tissue]
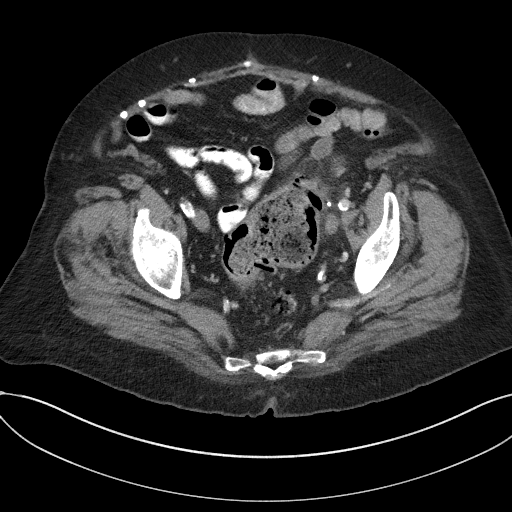
[im 30/89  soft-tissue]
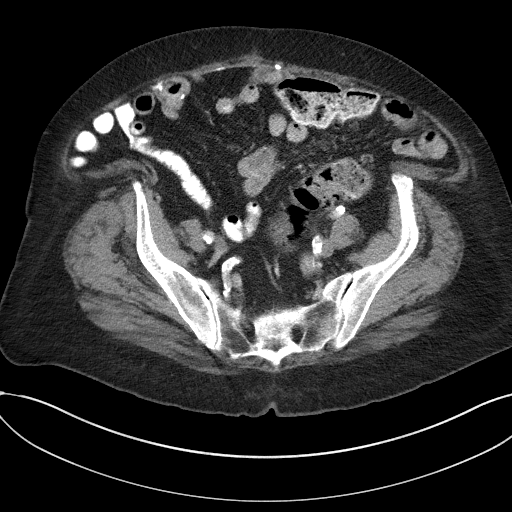
[im 36/89  soft-tissue]
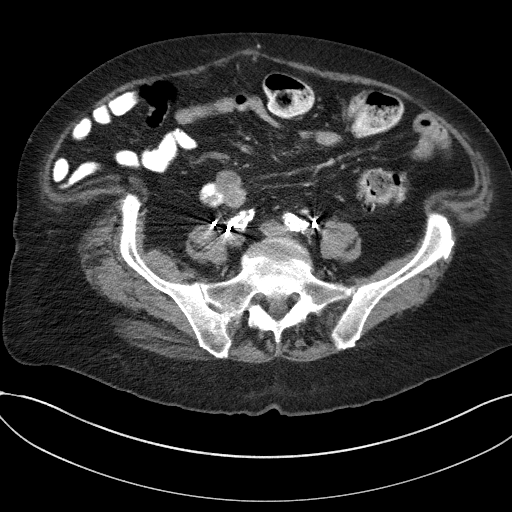
[im 47/89  soft-tissue]
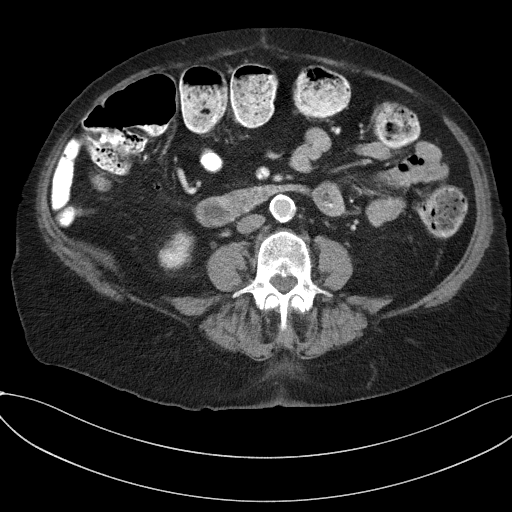
[im 53/89  soft-tissue]
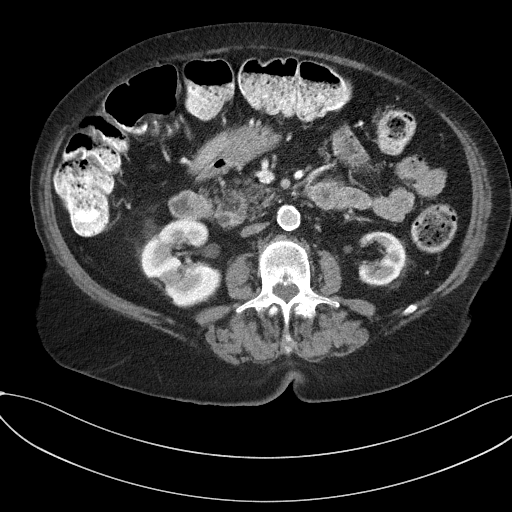
[im 59/89  soft-tissue]
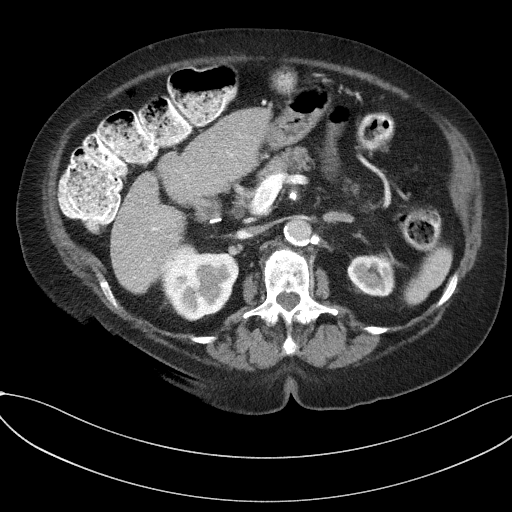
[im 59/89  bone]
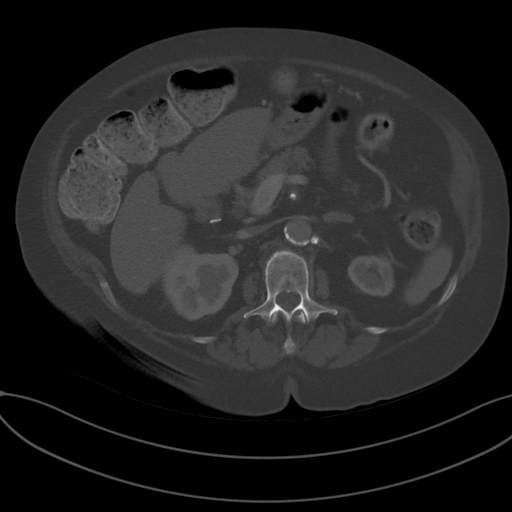
[im 65/89  soft-tissue]
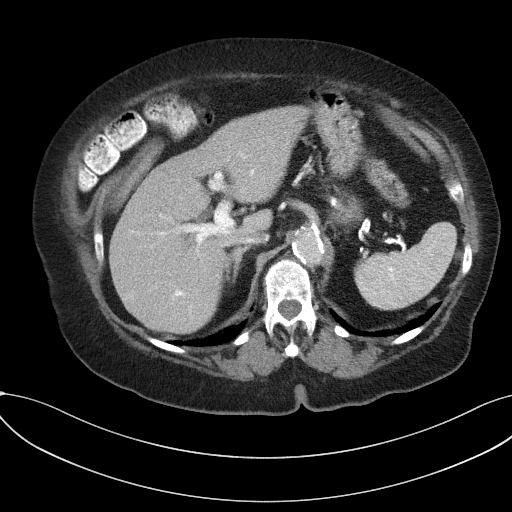
[im 71/89  soft-tissue]
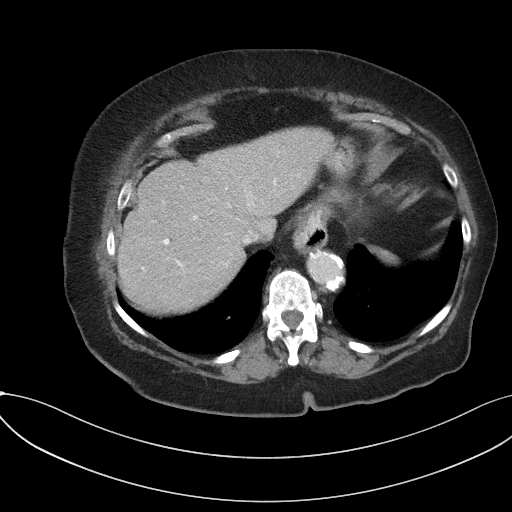
[im 77/89  soft-tissue]
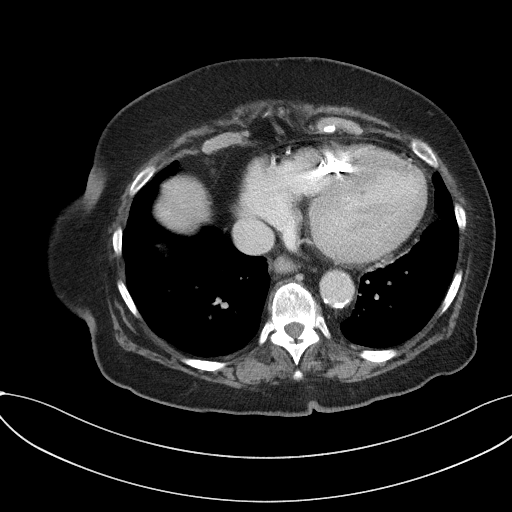
[im 83/89  soft-tissue]
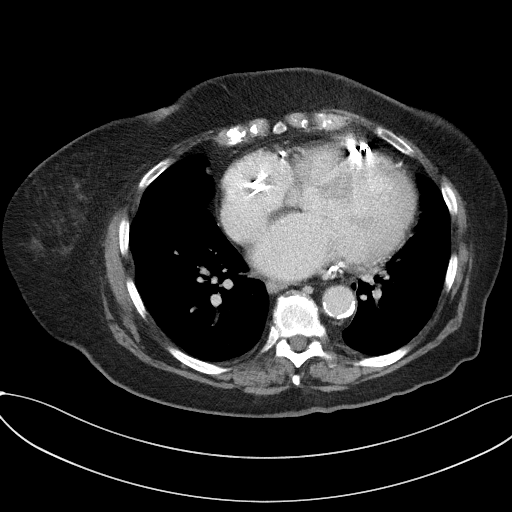

[Series 4: coronal st · coronal · 0.77mm/px · 3 of 104 slices shown]
[im 35/104  soft-tissue]
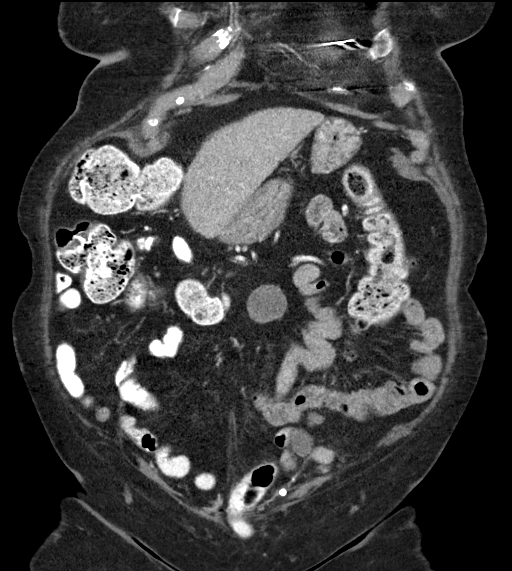
[im 46/104  soft-tissue]
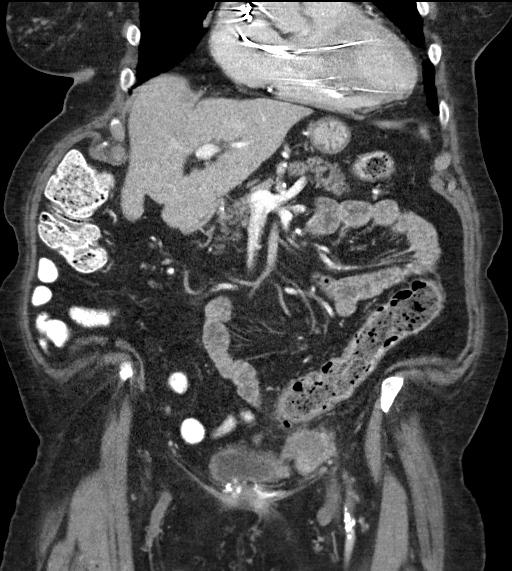
[im 58/104  soft-tissue]
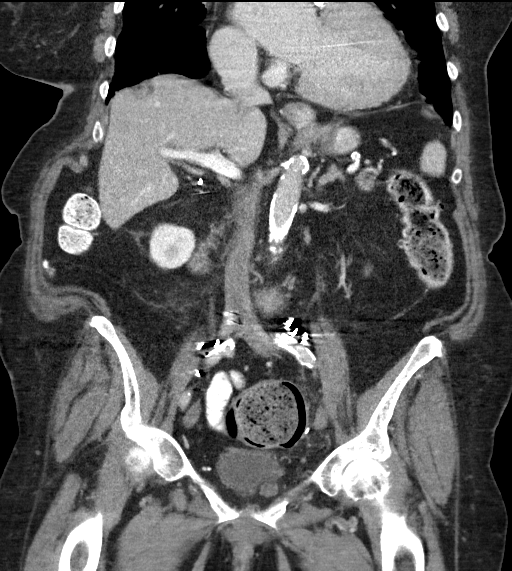

[16 of 46 positions shown; findings below may reference images not displayed]

FINDINGS: Lower chest: Right lower lobe nodule measures 12 mm (series 6, image
25), stable. Heart is enlarged. No pericardial or pleural effusion.
Atherosclerotic calcification of the aorta and coronary arteries.

Hepatobiliary: Scarring in the posterior aspect of the right hepatic
lobe, as before. Liver is otherwise unremarkable. Cholecystectomy.
No biliary ductal dilatation.

Pancreas: Negative.

Spleen: Negative.

Adrenals/Urinary Tract: Adrenal glands are unremarkable. Scarring in
the right kidney. Left kidney is atrophic. Ureters are decompressed.
Bladder is low in volume.

Stomach/Bowel: Small hiatal hernia. Stomach is decompressed.
Stomach, small bowel, appendix and colon are otherwise unremarkable.

Vascular/Lymphatic: Atherosclerotic calcification of the aorta
without aneurysm. No pathologically enlarged lymph nodes. Left
external iliac lymph node measures 10 mm (series 2, image 74),
stable.

Reproductive: Hysterectomy.

Other: Complex septated mass in the anterior left adnexa measures
4.5 x 4.9 cm (70), similar to 04/22/2018. Midline pelvic omental
nodularity has improved slightly in the interval, measuring up to 12
mm (series 2, image 59), compared to 2.5 cm previously. Central
mesenteric mass measures 3.4 cm (48), stable. Implants along the
left lateral and inferior bladder wall measure 9 mm (74) and 18 mm
(76), respectively, stable. Ventral hernia repair. Recurrent midline
pelvic hernia containing unobstructed small bowel (series 2, image
83), as on the prior exam.

Musculoskeletal: Degenerative changes in the spine. No worrisome
lytic or sclerotic lesions. Minimal grade 1 anterolisthesis of L5 on
S1.
IMPRESSION: 1. Minimal improvement in peritoneal carcinomatosis as evidenced by
decrease in size of only 1 of several implants.
2. Right lower lobe nodule is stable.
3. Small midline ventral pelvic hernia contains unobstructed small
bowel.
4.  Aortic atherosclerosis (LR90T-170.0).

## 2019-09-07 ENCOUNTER — Telehealth: Payer: Self-pay | Admitting: Interventional Cardiology

## 2019-09-07 NOTE — Telephone Encounter (Signed)
Attempted to call Patty [name]. The phone cut off after several rings.  Unable to leave message - will try again later.

## 2019-09-07 NOTE — Telephone Encounter (Signed)
Patty [name] from [facility] calling stating the patient has been complaining of a stiff neck and would like a recommendation for a medication the patient could take for it. She states the patient has tried cold and warm compresses for it, but still has the stiffness. She states it gets better throughout the day, but is still stiff. Please advise.

## 2019-09-09 NOTE — Telephone Encounter (Signed)
LMTCB for nurse Almyra Free with [facility] about patients stiff neck

## 2019-09-12 DIAGNOSIS — R509 Fever, unspecified: Secondary | ICD-10-CM | POA: Diagnosis not present

## 2019-09-12 DIAGNOSIS — R519 Headache, unspecified: Secondary | ICD-10-CM | POA: Diagnosis not present

## 2019-09-12 DIAGNOSIS — R5381 Other malaise: Secondary | ICD-10-CM | POA: Diagnosis not present

## 2019-09-12 DIAGNOSIS — Z1152 Encounter for screening for COVID-19: Secondary | ICD-10-CM | POA: Diagnosis not present

## 2019-09-12 DIAGNOSIS — Z20828 Contact with and (suspected) exposure to other viral communicable diseases: Secondary | ICD-10-CM | POA: Diagnosis not present

## 2019-09-12 DIAGNOSIS — R05 Cough: Secondary | ICD-10-CM | POA: Diagnosis not present

## 2019-09-19 ENCOUNTER — Other Ambulatory Visit: Payer: Self-pay | Admitting: Internal Medicine

## 2019-09-20 MED ORDER — DIGOXIN 62.5 MCG PO TABS: 0.0625 mg | ORAL_TABLET | Freq: Every day | ORAL | 11 refills | Status: DC

## 2019-09-21 NOTE — Telephone Encounter (Signed)
Left message for Patty [name] at [facility] regarding the patient's stiff neck. Instructed for her to call back if she still needed assistance with this.

## 2019-09-21 NOTE — Telephone Encounter (Signed)
Spoke with Almyra Free and made her aware that the patient can take tylenol for her stiff neck and if it continues to be an issue that she can reach out to her PCP.

## 2019-09-23 ENCOUNTER — Other Ambulatory Visit: Payer: Self-pay

## 2019-09-23 ENCOUNTER — Ambulatory Visit (INDEPENDENT_AMBULATORY_CARE_PROVIDER_SITE_OTHER): Payer: Medicare Other | Admitting: Podiatry

## 2019-09-23 ENCOUNTER — Encounter: Payer: Self-pay | Admitting: Podiatry

## 2019-09-23 DIAGNOSIS — L84 Corns and callosities: Secondary | ICD-10-CM | POA: Diagnosis not present

## 2019-09-23 DIAGNOSIS — B351 Tinea unguium: Secondary | ICD-10-CM

## 2019-09-23 DIAGNOSIS — G620 Drug-induced polyneuropathy: Secondary | ICD-10-CM

## 2019-09-23 DIAGNOSIS — M79676 Pain in unspecified toe(s): Secondary | ICD-10-CM | POA: Diagnosis not present

## 2019-09-23 DIAGNOSIS — D689 Coagulation defect, unspecified: Secondary | ICD-10-CM | POA: Diagnosis not present

## 2019-09-24 NOTE — Progress Notes (Signed)
Subjective: Patty [name] is a pleasant 86 y.o. female patient seen today with h/o chemotherapy induced neuropathy,  long term blood thinner, Xarelto, and presents today with painful, discolored, thick toenails and calluses which interfere with daily activities. Pain is relieved with periodic professional debridement.  She voices no new pedal problems on today's visit.  PCP is Dr. [name] and last visit was June, 2021.  Past Medical History:  Diagnosis Date  . Bleeding behind the abdominal cavity 10/2017  . Chronic anticoagulation - coumadin, CHADS2VASC=6 05/17/2015  . CKD (chronic kidney disease), stage III   . Combined systolic and diastolic heart failure ([place])   . Coronary artery disease    a. s/p multiple stents - stenting x 2 to the LAD and x 1 to the LCx in 2000, rotational arthrectomy to proximal LCx and DES to LAD in 2014 and DES to LCx in 2014, along with DES to LAD for re-in-stent stenosis in 2015, and DES to ostial LAD in 2016. b. 07/2016 - orbital atherectomy & DES to mid LCx.  . Diverticulitis   . Granulosa cell carcinoma ([place])    abd; last episode was in 2009  . Hyperlipidemia   . Hypertension   . LBBB (left bundle branch block)   . Myocardial infarction ([place]) 2002  . Obesity   . OSA on CPAP   . Ovarian ca ([place]) 2019  . Pacemaker failure    a. Prior leadless PPM with premature battery failure, being managed conservatively without replacement.  . Peripheral vascular disease ([place])   . Permanent atrial fibrillation ([place]) 2013  . Renal artery stenosis ([place])   . Tachycardia-bradycardia syndrome ([place])    a. s/p leadless pacemaker ([name]) implanted by Dr [name]  . Varicose veins     Patient Active Problem List   Diagnosis Date Noted  . Under care of palliative care physician   . ACS (acute coronary syndrome) ([place]) 06/09/2019  . Heart failure ([place]) 05/29/2019  . Atrial fibrillation, chronic ([place]) 05/28/2019  . Stable angina ([place]) 10/09/2018  . Coagulopathy  ([place]) 10/09/2018  . Chronic combined systolic (congestive) and diastolic (congestive) heart failure ([place]) 10/08/2018  . Peripheral neuropathy due to chemotherapy ([place]) 11/19/2017  . Pancytopenia, acquired ([place]) 11/19/2017  . Acute on chronic combined systolic (congestive) and diastolic (congestive) heart failure ([place]) 11/03/2017  . Hyperglycemia 11/03/2017  . Protein-calorie malnutrition, moderate ([place]) 10/28/2017  . Other constipation 10/20/2017  . Postherpetic neuralgia 10/20/2017  . Cancer associated pain 10/20/2017  . Physical debility 10/20/2017  . Goals of care, counseling/discussion 10/20/2017  . Ovarian cancer ([place]) 10/19/2017  . Intraperitoneal hemorrhage   . Abdominal pain 10/12/2017  . Pain of joint of left ankle and foot 08/03/2017  . Persistent atrial fibrillation   . Warfarin anticoagulation   . DOE (dyspnea on exertion)   . Status post coronary artery stent placement   . Normocytic anemia 07/11/2016  . History of small bowel obstruction 12/22/2015  . Permanent atrial fibrillation with RVR 12/15/2015  . Demand ischemia ([place]) 12/15/2015  . NSTEMI (non-ST elevated myocardial infarction) ([place]) 05/18/2015  . Chronic anticoagulation - coumadin, CHADS2VASC=6 05/17/2015  . Unstable angina ([place]) 05/17/2015  . Swelling of lower extremity 04/18/2015  . 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[place]) 06/16/2014  . Ischemic chest pain ([place])   . OSA on CPAP 05/14/2014  . Chest pain 01/05/2014  . Atrial flutter ([place])   . Hypokalemia   . Pacemaker - St Jude Leadless PPM   . Combined systolic and  diastolic heart failure, NYHA class 3 ([place])   . Tachycardia-bradycardia syndrome ([place]) 09/30/2012  . Atrial fibrillation with RVR ([place]) 12/28/2011  . Ventral hernia 10/16/2011  . Granulosa cell tumor of ovary 04/23/2011  . CAD (coronary artery disease) 10/03/2010  . Essential hypertension 10/03/2010  . Hyperlipidemia 10/03/2010  . Renal artery stenosis ([place]) 10/03/2010  . Obesity (BMI  30.0-34.9) 10/03/2010    Current Outpatient Medications on File Prior to Visit  Medication Sig Dispense Refill  . acetaminophen (TYLENOL) 500 MG tablet Take 500 mg by mouth every 6 (six) hours as needed for mild pain or headache.     Marland Kitchen atorvastatin (LIPITOR) 80 MG tablet TAKE 1 TABLET BY MOUTH EVERY DAY IN THE MORNING 90 tablet 3  . beta carotene w/minerals (OCUVITE) tablet Take 1 tablet by mouth 2 (two) times daily.     . clopidogrel (PLAVIX) 75 MG tablet TAKE 1 TABLET BY MOUTH EVERY DAY 30 tablet 11  . Coenzyme Q-10 100 MG capsule Take 200 mg by mouth at bedtime.     . Digoxin 62.5 MCG TABS Take 0.0625 mg by mouth daily. 30 tablet 11  . dofetilide (TIKOSYN) 125 MCG capsule TAKE 1 CAPSULE BY MOUTH TWICE A DAY 60 capsule 6  . furosemide (LASIX) 40 MG tablet TAKE 2 TABLETS BY MOUTH EVERY MORNING AND 2 TABLETS AS NEEDED FOR WEIGHT GAIN/ABDOMINAL DISTENSION 120 tablet 4  . gabapentin (NEURONTIN) 300 MG capsule TAKE 1 CAPSULE BY MOUTH TWICE A DAY 60 capsule 1  . isosorbide mononitrate (IMDUR) 60 MG 24 hr tablet Take 1.5 tablets (90 mg total) by mouth daily. 45 tablet 3  . magnesium oxide (MAG-OX) 400 (241.3 Mg) MG tablet Take 2 tablets (800 mg total) by mouth daily. 60 tablet 6  . metoprolol (TOPROL-XL) 200 MG 24 hr tablet TAKE 1 TABLET (200 MG TOTAL) BY MOUTH DAILY. TAKE WITH OR IMMEDIATELY FOLLOWING A MEAL. 30 tablet 3  . Multiple Vitamin (MULTIVITAMIN) capsule Take 1 capsule by mouth daily.    . nitroGLYCERIN (NITROSTAT) 0.4 MG SL tablet PLACE 1 TABLET (0.4 MG TOTAL) UNDER THE TONGUE EVERY 5 (FIVE) MINUTES AS NEEDED FOR CHEST PAIN 75 tablet 2  . potassium chloride SA (KLOR-CON) 20 MEQ tablet Take 1 tablet (20 mEq total) by mouth 2 (two) times daily. 180 tablet 3  . Rivaroxaban (XARELTO) 15 MG TABS tablet Take 1 tablet (15 mg total) by mouth daily with supper. 30 tablet 5   No current facility-administered medications on file prior to visit.    Allergies  Allergen Reactions  . Bee Venom  Anaphylaxis  . Other Nausea And Vomiting and Other (See Comments)    Pain medications cause severe vomiting. Tolerated slow IV morphine drip  . Oxycodone Hcl Nausea And Vomiting  . Amiodarone Nausea Only  . Chlorhexidine   . Prednisone Palpitations and Other (See Comments)    "Rapid Heart Beat"    Objective: Physical Exam  General: Patty [name] is a pleasant 86 y.o. Caucasian female, WD, WN in NAD. AAO x 3.   Vascular:  Neurovascular status unchanged b/l lower extremities. Capillary refill time to digits immediate b/l. Palpable pedal pulses b/l LE. Pedal hair absent. Lower extremity skin temperature gradient within normal limits.  Dermatological:  Pedal skin with normal turgor, texture and tone bilaterally. No open wounds bilaterally. No interdigital macerations bilaterally. Toenails 1-5 b/l elongated, discolored, dystrophic, thickened, crumbly with subungual debris and tenderness to dorsal palpation. Hyperkeratotic lesion(s) L hallux, R hallux and submet head 1 left  foot.  No erythema, no edema, no drainage, no flocculence.  Musculoskeletal:  Normal muscle strength 5/5 to all lower extremity muscle groups bilaterally. No pain crepitus or joint limitation noted with ROM b/l. Hallux valgus with bunion deformity noted b/l lower extremities.  Neurological:  Protective sensation intact 5/5 intact bilaterally with 10g monofilament b/l. Vibratory sensation intact b/l.  Assessment and Plan:  1. Pain due to onychomycosis of toenail   2. Callus   3. Peripheral neuropathy due to chemotherapy ([place])   4. Coagulopathy ([place])    -Examined patient. -No new findings. No new orders. -Toenails 1-5 b/l were debrided in length and girth with sterile nail nippers and dremel without iatrogenic bleeding.  -Callus(es) L hallux, R hallux and submet head 1 right foot pared utilizing sterile scalpel blade without complication or incident. Total number debrided =3. -Patient to report any pedal injuries to  medical professional immediately. -Patient to continue soft, supportive shoe gear daily. -Patient/POA to call should there be question/concern in the interim.  Return in about 3 months (around 12/24/2019).  Marzetta Board, DPM

## 2019-10-03 ENCOUNTER — Telehealth (INDEPENDENT_AMBULATORY_CARE_PROVIDER_SITE_OTHER): Payer: Medicare Other | Admitting: Internal Medicine

## 2019-10-03 ENCOUNTER — Other Ambulatory Visit: Payer: Self-pay

## 2019-10-03 ENCOUNTER — Encounter: Payer: Self-pay | Admitting: Internal Medicine

## 2019-10-03 VITALS — BP 157/62 | HR 62 | Ht 60.0 in | Wt 171.2 lb

## 2019-10-03 DIAGNOSIS — I200 Unstable angina: Secondary | ICD-10-CM

## 2019-10-03 DIAGNOSIS — I4819 Other persistent atrial fibrillation: Secondary | ICD-10-CM

## 2019-10-03 DIAGNOSIS — D6869 Other thrombophilia: Secondary | ICD-10-CM

## 2019-10-03 DIAGNOSIS — I519 Heart disease, unspecified: Secondary | ICD-10-CM

## 2019-10-03 DIAGNOSIS — I10 Essential (primary) hypertension: Secondary | ICD-10-CM

## 2019-10-03 NOTE — Progress Notes (Signed)
Electrophysiology TeleHealth Note  Due to national recommendations of social distancing due to [place] 19, an audio telehealth visit is felt to be most appropriate for this patient at this time.  Verbal consent was obtained by me for the telehealth visit today.  The patient does not have capability for a virtual visit.  A phone visit is therefore required today.   Date:  10/03/2019   ID:  Patty [name], DOB Jun 18, 1930, MRN [id number]  Location: patient's home  Provider location:  [place]  Evaluation Performed: Follow-up visit  PCP:  [name], MD   Electrophysiologist:  Dr [name]  Chief Complaint:  palpitations  History of Present Illness:    Patty [name] is a 86 y.o. female who presents via telehealth conferencing today.  Since last being seen in our clinic, the patient reports doing very well.  She has palliative care at home.  She thinks that she has" learned to live with" her afib.  Today, she denies symptoms of palpitations, chest pain, shortness of breath,  lower extremity edema, dizziness, presyncope, or syncope.  The patient is otherwise without complaint today.     Past Medical History:  Diagnosis Date  . Bleeding behind the abdominal cavity 10/2017  . Chronic anticoagulation - coumadin, CHADS2VASC=6 05/17/2015  . CKD (chronic kidney disease), stage III   . Combined systolic and diastolic heart failure ([place])   . Coronary artery disease    a. s/p multiple stents - stenting x 2 to the LAD and x 1 to the LCx in 2000, rotational arthrectomy to proximal LCx and DES to LAD in 2014 and DES to LCx in 2014, along with DES to LAD for re-in-stent stenosis in 2015, and DES to ostial LAD in 2016. b. 07/2016 - orbital atherectomy & DES to mid LCx.  . Diverticulitis   . Granulosa cell carcinoma ([place])    abd; last episode was in 2009  . Hyperlipidemia   . Hypertension   . LBBB (left bundle branch block)   . Myocardial infarction ([place]) 2002  . Obesity   . OSA on CPAP     . Ovarian ca ([place]) 2019  . Pacemaker failure    a. Prior leadless PPM with premature battery failure, being managed conservatively without replacement.  . Peripheral vascular disease ([place])   . Permanent atrial fibrillation ([place]) 2013  . Renal artery stenosis ([place])   . Tachycardia-bradycardia syndrome ([place])    a. s/p leadless pacemaker ([name]) implanted by Dr [name]  . Varicose veins     Past Surgical History:  Procedure Laterality Date  . ABDOMINAL HYSTERECTOMY    . CARDIAC CATHETERIZATION  09/03/2007   EF 70%; Failed attempt at PCI to OM  . CARDIAC CATHETERIZATION  11/01/2003   EF 70%  . CARDIAC CATHETERIZATION N/A 12/14/2015   Procedure: Left Heart Cath and Coronary Angiography;  Surgeon: Burnell Blanks, MD;  Location: [facility] CV LAB;  Service: Cardiovascular;  Laterality: N/A;  . CARDIOVERSION  12/31/2011   Procedure: CARDIOVERSION;  Surgeon: [name], MD;  Location: [facility] ENDOSCOPY;  Service: Cardiovascular;  Laterality: N/A;  . CARDIOVERSION N/A 12/31/2011   Procedure: CARDIOVERSION;  Surgeon: [name], MD;  Location: [facility] CATH LAB;  Service: Cardiovascular;  Laterality: N/A;  . CARDIOVERSION N/A 11/15/2018   Procedure: CARDIOVERSION;  Surgeon: Dorothy Spark, MD;  Location: [facility] ENDOSCOPY;  Service: Cardiovascular;  Laterality: N/A;  . CARDIOVERSION N/A 06/10/2019   Procedure: CARDIOVERSION;  Surgeon: Fay Records, MD;  Location: [facility];  Service: Cardiovascular;  Laterality: N/A;  . CARDIOVERSION N/A 06/14/2019   Procedure: CARDIOVERSION;  Surgeon: Pixie Casino, MD;  Location: [facility];  Service: Cardiovascular;  Laterality: N/A;  . CATARACT EXTRACTION, BILATERAL  2015  . CHOLECYSTECTOMY  1980's  . COLON SURGERY  2004   colectomy for diverticulosis  . CORONARY ANGIOPLASTY WITH STENT PLACEMENT  2000    and 08/11/2012; 11/12/2012: 3 + 2 LAD & CFX; 2nd CFX stent 11/12/2012  . CORONARY ATHERECTOMY N/A 07/15/2016   Procedure: Coronary  Atherectomy;  Surgeon: Martinique, Peter M, MD;  Location: [facility] CV LAB;  Service: Cardiovascular;  Laterality: N/A;  . CORONARY BALLOON ANGIOPLASTY N/A 07/14/2016   Procedure: Coronary Balloon Angioplasty;  Surgeon: Martinique, Peter M, MD;  Location: [facility] CV LAB;  Service: Cardiovascular;  Laterality: N/A;  . CORONARY STENT INTERVENTION N/A 07/15/2016   Procedure: Coronary Stent Intervention;  Surgeon: Martinique, Peter M, MD;  Location: [facility] CV LAB;  Service: Cardiovascular;  Laterality: N/A;  . FRACTIONAL FLOW RESERVE WIRE  10/07/2013   Procedure: [place];  Surgeon: [name], MD;  Location: [facility] CATH LAB;  Service: Cardiovascular;;  . HERNIA REPAIR  2005   "laparoscopic"  . IR IMAGING GUIDED PORT INSERTION  10/28/2017  . LEFT HEART CATH AND CORONARY ANGIOGRAPHY N/A 07/14/2016   Procedure: Left Heart Cath and Coronary Angiography;  Surgeon: Martinique, Peter M, MD;  Location: [facility] CV LAB;  Service: Cardiovascular;  Laterality: N/A;  . LEFT HEART CATH AND CORONARY ANGIOGRAPHY N/A 11/10/2018   Procedure: LEFT HEART CATH AND CORONARY ANGIOGRAPHY;  Surgeon: Wellington Hampshire, MD;  Location: [facility] CV LAB;  Service: Cardiovascular;  Laterality: N/A;  . LEFT HEART CATHETERIZATION WITH CORONARY ANGIOGRAM N/A 11/12/2012   Procedure: LEFT HEART CATHETERIZATION WITH CORONARY ANGIOGRAM;  Surgeon: [name], MD;  Location: [facility] CATH LAB;  Service: Cardiovascular;  Laterality: N/A;  . LEFT HEART CATHETERIZATION WITH CORONARY ANGIOGRAM N/A 10/07/2013   Procedure: LEFT HEART CATHETERIZATION WITH CORONARY ANGIOGRAM;  Surgeon: [name], MD;  Location: [facility] CATH LAB;  Service: Cardiovascular;  Laterality: N/A;  . LEFT HEART CATHETERIZATION WITH CORONARY ANGIOGRAM N/A 12/14/2013   Procedure: LEFT HEART CATHETERIZATION WITH CORONARY ANGIOGRAM;  Surgeon: Sinclair Grooms, MD;  Location: [facility] CATH LAB;  Service: Cardiovascular;  Laterality: N/A;  . LEFT HEART  CATHETERIZATION WITH CORONARY ANGIOGRAM N/A 05/16/2014   Procedure: LEFT HEART CATHETERIZATION WITH CORONARY ANGIOGRAM;  Surgeon: Sherren Mocha, MD;  Location: [facility] C[facility] LAB;  Service: Cardiovascular;  Laterality: N/A;  . PERCUTANEOUS CORONARY INTERVENTION-BALLOON ONLY  08/04/2012   Procedure: PERCUTANEOUS CORONARY INTERVENTION-BALLOON ONLY;  Surgeon: [name], MD;  Location: [facility] CATH LAB;  Service: Cardiovascular;;  . PERCUTANEOUS CORONARY ROTOBLATOR INTERVENTION (PCI-R) N/A 08/11/2012   Procedure: PERCUTANEOUS CORONARY ROTOBLATOR INTERVENTION (PCI-R);  Surgeon: [name], MD;  Location: [facility] CATH LAB;  Service: Cardiovascular;  Laterality: N/A;  . PERCUTANEOUS CORONARY STENT INTERVENTION (PCI-S)  10/07/2013   Procedure: PERCUTANEOUS CORONARY STENT INTERVENTION (PCI-S);  Surgeon: [name], MD;  Location: [facility] CATH LAB;  Service: Cardiovascular;;  . PERMANENT PACEMAKER INSERTION N/A 03/16/2012   [name] (SJM) leadless pacemaker (LEADLESS II STUDY PATEINT)  . SALIVARY GLAND SURGERY  2000's   "had a little lump removed; granulosa related; it was benign" (08/11/2012)  . TEE WITHOUT CARDIOVERSION  12/31/2011   Procedure: TRANSESOPHAGEAL ECHOCARDIOGRAM (TEE);  Surgeon: [name], MD;  Location: [place];  Service: Cardiovascular;  Laterality: N/A;  .  UMBILICAL GRANULOMA EXCISION  2000   2003; 2004; 2007: "all in my abdomen including small intestines, outside my ?uterus/etc" (08/11/2012)  . VARICOSE VEIN SURGERY Bilateral 1977    Current Outpatient Medications  Medication Sig Dispense Refill  . acetaminophen (TYLENOL) 500 MG tablet Take 500 mg by mouth every 6 (six) hours as needed for mild pain or headache.     Marland Kitchen atorvastatin (LIPITOR) 80 MG tablet TAKE 1 TABLET BY MOUTH EVERY DAY IN THE MORNING 90 tablet 3  . beta carotene w/minerals (OCUVITE) tablet Take 1 tablet by mouth 2 (two) times daily.     . clopidogrel (PLAVIX) 75 MG tablet TAKE 1 TABLET BY MOUTH EVERY DAY  30 tablet 11  . Coenzyme Q-10 100 MG capsule Take 200 mg by mouth at bedtime.     . Digoxin 62.5 MCG TABS Take 0.0625 mg by mouth daily. 30 tablet 11  . dofetilide (TIKOSYN) 125 MCG capsule TAKE 1 CAPSULE BY MOUTH TWICE A DAY 60 capsule 6  . furosemide (LASIX) 40 MG tablet TAKE 2 TABLETS BY MOUTH EVERY MORNING AND 2 TABLETS AS NEEDED FOR WEIGHT GAIN/ABDOMINAL DISTENSION 120 tablet 4  . gabapentin (NEURONTIN) 300 MG capsule TAKE 1 CAPSULE BY MOUTH TWICE A DAY 60 capsule 1  . magnesium oxide (MAG-OX) 400 (241.3 Mg) MG tablet Take 2 tablets (800 mg total) by mouth daily. 60 tablet 6  . metoprolol (TOPROL-XL) 200 MG 24 hr tablet TAKE 1 TABLET (200 MG TOTAL) BY MOUTH DAILY. TAKE WITH OR IMMEDIATELY FOLLOWING A MEAL. 30 tablet 3  . Multiple Vitamin (MULTIVITAMIN) capsule Take 1 capsule by mouth daily.    . nitroGLYCERIN (NITROSTAT) 0.4 MG SL tablet PLACE 1 TABLET (0.4 MG TOTAL) UNDER THE TONGUE EVERY 5 (FIVE) MINUTES AS NEEDED FOR CHEST PAIN 75 tablet 2  . potassium chloride SA (KLOR-CON) 20 MEQ tablet Take 1 tablet (20 mEq total) by mouth 2 (two) times daily. 180 tablet 3  . Rivaroxaban (XARELTO) 15 MG TABS tablet Take 1 tablet (15 mg total) by mouth daily with supper. 30 tablet 5  . isosorbide mononitrate (IMDUR) 60 MG 24 hr tablet Take 1.5 tablets (90 mg total) by mouth daily. 45 tablet 3   No current facility-administered medications for this visit.    Allergies:   Bee venom, Other, Oxycodone hcl, Amiodarone, Chlorhexidine, and Prednisone   Social History:  The patient  reports that she quit smoking about 45 years ago. Her smoking use included cigarettes. She has a 32.00 pack-year smoking history. She has never used smokeless tobacco. She reports current alcohol use of about 7.0 standard drinks of alcohol per week. She reports that she does not use drugs.   ROS:  Please see the history of present illness.   All other systems are personally reviewed and negative.    Exam:    Vital Signs:  BP  (!) 157/62   Pulse 62   Ht 5' (1.524 m)   Wt 171 lb 3.2 oz (77.7 kg)   BMI 33.44 kg/m   Well sounding, alert and conversant   Labs/Other Tests and Data Reviewed:    Recent Labs: 10/08/2018: TSH 2.089 03/22/2019: NT-Pro BNP 3,420 05/31/2019: ALT 22 06/09/2019: B Natriuretic Peptide 1,092.6 06/22/2019: Magnesium 2.0 08/15/2019: BUN 22; Creatinine, Ser 1.10; Hemoglobin 12.8; Platelets 196; Potassium 3.9; Sodium 145   Wt Readings from Last 3 Encounters:  10/03/19 171 lb 3.2 oz (77.7 kg)  07/25/19 169 lb 12.8 oz (77 kg)  06/27/19 171 lb 3.2 oz (77.7 kg)  Last device remote is reviewed from [place] PDF which reveals normal device function, afib burden is 79%   ASSESSMENT & PLAN:    1.  Persistent afib/ atrial tachycardia/ atypical atrial flutter Doing well currently She appears to be rate controlled but persistently in afib.  AF burden by remote is 79% She reports that her heart rates are mostly 60s. Continue on tikosyn for now, but we will likely stop tikosyn on return. chads2vasc score is 6.  Continue xarelto We could eventually consider AV nodal ablation, though currently, her V rates appear stable.  2. Chronic systolic dysfunction EF 01% euvolemic by symptoms Complicated by advanced age and stage III renal failure  3. CAD Stable No change required today  4. Sick sinus syndrome Remotes are up to date Normal device functon  5. HTN Stable No change required today  6. OSA Compliance with CPAP is advised  7. HL Continue statin  Risks, benefits and potential toxicities for medications prescribed and/or refilled reviewed with patient today.   Follow-up:  AF clinic in 3 months I will see in 6 months   Patient Risk:  after full review of this patients clinical status, I feel that they are at moderate risk at this time.  Today, I have spent 15 minutes with the patient with telehealth technology discussing arrhythmia management .    Army Fossa, MD    10/03/2019 11:45 AM     [facility] [address] [place] [address] [phone] (office) [phone] (fax)

## 2019-10-06 DIAGNOSIS — G4733 Obstructive sleep apnea (adult) (pediatric): Secondary | ICD-10-CM | POA: Diagnosis not present

## 2019-10-08 ENCOUNTER — Other Ambulatory Visit: Payer: Self-pay | Admitting: Hematology and Oncology

## 2019-10-12 ENCOUNTER — Other Ambulatory Visit: Payer: Self-pay | Admitting: *Deleted

## 2019-10-12 MED ORDER — GABAPENTIN 300 MG PO CAPS: 300.0000 mg | ORAL_CAPSULE | Freq: Two times a day (BID) | ORAL | 1 refills | Status: AC

## 2019-10-18 DIAGNOSIS — C562 Malignant neoplasm of left ovary: Secondary | ICD-10-CM | POA: Diagnosis not present

## 2019-10-18 DIAGNOSIS — Z950 Presence of cardiac pacemaker: Secondary | ICD-10-CM | POA: Diagnosis not present

## 2019-10-18 DIAGNOSIS — E7800 Pure hypercholesterolemia, unspecified: Secondary | ICD-10-CM | POA: Diagnosis not present

## 2019-10-18 DIAGNOSIS — Z1389 Encounter for screening for other disorder: Secondary | ICD-10-CM | POA: Diagnosis not present

## 2019-10-18 DIAGNOSIS — I10 Essential (primary) hypertension: Secondary | ICD-10-CM | POA: Diagnosis not present

## 2019-10-18 DIAGNOSIS — Z0000 Encounter for general adult medical examination without abnormal findings: Secondary | ICD-10-CM | POA: Diagnosis not present

## 2019-10-18 DIAGNOSIS — I5042 Chronic combined systolic (congestive) and diastolic (congestive) heart failure: Secondary | ICD-10-CM | POA: Diagnosis not present

## 2019-10-30 ENCOUNTER — Other Ambulatory Visit: Payer: Self-pay | Admitting: Interventional Cardiology

## 2019-10-31 ENCOUNTER — Telehealth: Payer: Self-pay | Admitting: Interventional Cardiology

## 2019-10-31 MED ORDER — ISOSORBIDE MONONITRATE ER 60 MG PO TB24: 90.0000 mg | ORAL_TABLET | Freq: Every day | ORAL | 9 refills | Status: DC

## 2019-10-31 NOTE — Telephone Encounter (Signed)
Called and verified with pt that she does not need a refill of xarelto at this time. Pt stated that she would let us know if she needs refill.

## 2019-10-31 NOTE — Telephone Encounter (Signed)
Prescription refill request for Xarelto received.   Last office visit: [place], 10/03/2019 Weight: 77.1 kg  Age: 86 y.o. Scr: 1.10, 08/15/2019 CrCl: 44 ml/min   Prescription refill sent.

## 2019-10-31 NOTE — Telephone Encounter (Signed)
Pt's medication was sent to pt's pharmacy as requested. Confirmation received.  °

## 2019-10-31 NOTE — Telephone Encounter (Signed)
*  STAT* If patient is at the pharmacy, call can be transferred to refill team.   1. Which medications need to be refilled? (please list name of each medication and dose if known) a new prescription for Isosorbide- orginally it was written by hospital specialist  2. Which pharmacy/location (including street and city if local pharmacy) is medication to be sent to? [facility] [address] [place], [place]  3. Do they need a 30 day or 90 day supply? 30 days

## 2019-10-31 NOTE — Telephone Encounter (Signed)
Xarelto showing up now twice on pt's medlist. Pt had her Xarelto last refilled on 08/16/2019- 30 day supply with 5 refills. Pt should still have refills on file. Discontinued Xarelto so it is only showing up once on her med list.

## 2019-11-07 ENCOUNTER — Ambulatory Visit (INDEPENDENT_AMBULATORY_CARE_PROVIDER_SITE_OTHER): Payer: Medicare Other

## 2019-11-07 DIAGNOSIS — I495 Sick sinus syndrome: Secondary | ICD-10-CM

## 2019-11-07 DIAGNOSIS — U071 COVID-19: Secondary | ICD-10-CM | POA: Diagnosis not present

## 2019-11-08 ENCOUNTER — Telehealth: Payer: Self-pay | Admitting: Nurse Practitioner

## 2019-11-08 ENCOUNTER — Other Ambulatory Visit (HOSPITAL_COMMUNITY): Payer: Self-pay

## 2019-11-08 NOTE — Telephone Encounter (Signed)
Called patient to discuss Covid symptoms and the use of casirivimab/imdevimab, a monoclonal antibody infusion for those with mild to moderate Covid symptoms and at a high risk of hospitalization.  Pt is qualified for this infusion at the [facility] infusion center due to; Specific high risk criteria : Older age (>/= 84 yo); CAD/CHF/HTN; CKD; BMI   Message left to call back our hotline [phone].  Murray Hodgkins, NP

## 2019-11-09 ENCOUNTER — Telehealth: Payer: Self-pay | Admitting: Physician Assistant

## 2019-11-09 LAB — CUP PACEART REMOTE DEVICE CHECK
Battery Remaining Longevity: 123 mo
Battery Remaining Percentage: 95.5 %
Battery Voltage: 3.01 V
Brady Statistic AP VP Percent: 2.8 %
Brady Statistic AP VS Percent: 9.4 %
Brady Statistic AS VP Percent: 37 %
Brady Statistic AS VS Percent: 51 %
Brady Statistic RA Percent Paced: 13 %
Brady Statistic RV Percent Paced: 39 %
Date Time Interrogation Session: 20211005031715
Implantable Lead Implant Date: 20181224
Implantable Lead Implant Date: 20181224
Implantable Lead Location: 753859
Implantable Lead Location: 753860
Implantable Pulse Generator Implant Date: 20181224
Lead Channel Impedance Value: 440 Ohm
Lead Channel Impedance Value: 900 Ohm
Lead Channel Pacing Threshold Amplitude: 0.75 V
Lead Channel Pacing Threshold Amplitude: 0.75 V
Lead Channel Pacing Threshold Pulse Width: 0.4 ms
Lead Channel Pacing Threshold Pulse Width: 0.4 ms
Lead Channel Sensing Intrinsic Amplitude: 5 mV
Lead Channel Sensing Intrinsic Amplitude: 6.8 mV
Lead Channel Setting Pacing Amplitude: 2.5 V
Lead Channel Setting Pacing Amplitude: 2.5 V
Lead Channel Setting Pacing Pulse Width: 0.4 ms
Lead Channel Setting Sensing Sensitivity: 2 mV
Pulse Gen Model: 2272
Pulse Gen Serial Number: 8968200

## 2019-11-09 NOTE — Progress Notes (Signed)
Remote pacemaker transmission.   

## 2019-11-09 NOTE — Telephone Encounter (Signed)
Called to Discuss with patient about Covid symptoms and the use of the monoclonal antibody infusion for those with mild to moderate Covid symptoms and at a high risk of hospitalization.     Pt appears to qualify for this infusion due to co-morbid conditions and/or a member of an at-risk group in accordance with the FDA Emergency Use Authorization.    Unable to reach pt - left VM on both numbers and sent MyChart message. She qualifies with age, BMI, HTN/CAD, and CKD,

## 2019-11-10 ENCOUNTER — Other Ambulatory Visit (HOSPITAL_COMMUNITY): Payer: Self-pay | Admitting: Adult Health

## 2019-11-10 ENCOUNTER — Ambulatory Visit (HOSPITAL_COMMUNITY)
Admission: RE | Admit: 2019-11-10 | Discharge: 2019-11-10 | Disposition: A | Discharge status: Home / Self Care | Payer: Medicare Other | Source: Ambulatory Visit | Attending: Pulmonary Disease | Admitting: Pulmonary Disease

## 2019-11-10 ENCOUNTER — Encounter: Payer: Self-pay | Admitting: Adult Health

## 2019-11-10 DIAGNOSIS — Z23 Encounter for immunization: Secondary | ICD-10-CM | POA: Insufficient documentation

## 2019-11-10 DIAGNOSIS — U071 COVID-19: Secondary | ICD-10-CM | POA: Diagnosis not present

## 2019-11-10 MED ORDER — SODIUM CHLORIDE 0.9 % IV SOLN: Freq: Once | INTRAVENOUS | Status: AC

## 2019-11-10 MED ORDER — SODIUM CHLORIDE 0.9 % IV SOLN: INTRAVENOUS | Status: DC | PRN

## 2019-11-10 MED ORDER — METHYLPREDNISOLONE SODIUM SUCC 125 MG IJ SOLR: 125.0000 mg | Freq: Once | INTRAMUSCULAR | Status: DC | PRN

## 2019-11-10 MED ORDER — DIPHENHYDRAMINE HCL 50 MG/ML IJ SOLN: 50.0000 mg | Freq: Once | INTRAMUSCULAR | Status: DC | PRN

## 2019-11-10 MED ORDER — ALBUTEROL SULFATE HFA 108 (90 BASE) MCG/ACT IN AERS: 2.0000 | INHALATION_SPRAY | Freq: Once | RESPIRATORY_TRACT | Status: DC | PRN

## 2019-11-10 MED ORDER — EPINEPHRINE 0.3 MG/0.3ML IJ SOAJ: 0.3000 mg | Freq: Once | INTRAMUSCULAR | Status: DC | PRN

## 2019-11-10 MED ORDER — FAMOTIDINE IN NACL 20-0.9 MG/50ML-% IV SOLN: 20.0000 mg | Freq: Once | INTRAVENOUS | Status: DC | PRN

## 2019-11-10 NOTE — Progress Notes (Signed)
I connected by phone with Patty [name] on 11/10/2019 at 11:09 AM to discuss the potential use of a new treatment for mild to moderate COVID-19 viral infection in non-hospitalized patients.  This patient is a 86 y.o. female that meets the FDA criteria for Emergency Use Authorization of COVID monoclonal antibody casirivimab/imdevimab or bamlanivimab/eteseviamb.  Has a (+) direct SARS-CoV-2 viral test result  Has mild or moderate COVID-19   Is NOT hospitalized due to COVID-19  Is within 10 days of symptom onset  Has at least one of the high risk factor(s) for progression to severe COVID-19 and/or hospitalization as defined in EUA.  Specific high risk criteria : Older age (>/= 84 yo), Chronic Kidney Disease (CKD) and Cardiovascular disease or hypertension   Day 8 of symptoms   I have spoken and communicated the following to the patient or parent/caregiver regarding COVID monoclonal antibody treatment:  1. FDA has authorized the emergency use for the treatment of mild to moderate COVID-19 in adults and pediatric patients with positive results of direct SARS-CoV-2 viral testing who are 53 years of age and older weighing at least 40 kg, and who are at high risk for progressing to severe COVID-19 and/or hospitalization.  2. The significant known and potential risks and benefits of COVID monoclonal antibody, and the extent to which such potential risks and benefits are unknown.  3. Information on available alternative treatments and the risks and benefits of those alternatives, including clinical trials.  4. Patients treated with COVID monoclonal antibody should continue to self-isolate and use infection control measures (e.g., wear mask, isolate, social distance, avoid sharing personal items, clean and disinfect "high touch" surfaces, and frequent handwashing) according to CDC guidelines.   5. The patient or parent/caregiver has the option to accept or refuse COVID monoclonal antibody  treatment.  After reviewing this information with the patient, the patient has agreed to receive one of the available covid 19 monoclonal antibodies and will be provided an appropriate fact sheet prior to infusion. Scot Dock, NP 11/10/2019 11:09 AM

## 2019-11-10 NOTE — Progress Notes (Signed)
  Diagnosis: COVID-19  Physician:Dr Joya Gaskins   Procedure: [facility] Med: casirivimab\imdevimab infusion - Provided patient with casirivimab\imdevimab fact sheet for patients, parents and caregivers prior to infusion.  Complications: No immediate complications noted.  Discharge: Discharged home   Patty [name] 11/10/2019

## 2019-11-10 NOTE — Telephone Encounter (Signed)
Fu message   Pt called in , returning [place] call  Best number - [phone] land line .

## 2019-11-10 NOTE — Discharge Instructions (Signed)

## 2019-11-10 NOTE — Telephone Encounter (Signed)
Left detailed message on infusion hotline as requested. D/t high call volume per recording it may take 48 hours to get response.  Called pt and she states that she that she received a call from [facility] this morning and was scheduled today at 230pm to have infusion.

## 2019-12-01 NOTE — Progress Notes (Signed)
Cardiology Office Note   Date:  12/02/2019   ID:  Patty [name], DOB [birth year], MRN [id number]  PCP:  [name], MD    No chief complaint on file.  CAD/AFib  Wt Readings from Last 3 Encounters:  12/02/19 170 lb 6.4 oz (77.3 kg)  10/03/19 171 lb 3.2 oz (77.7 kg)  07/25/19 169 lb 12.8 oz (77 kg)       History of Present Illness: Patty [name] is a 86 y.o. female  with history of CAD s/p multiple stents, chronic combined CHF, permanent atrial fib/flutter on Coumadin with tachy-brady syndrome s/p leadless PPM (with premature battery failure no longer active - being followed conservatively for now), probable CKD III per labs, granulosa cell carcinoma, HTN, HLD, LBBB, varicose veins, renal artery stenosis, PVD, diverticulitis, OSA who presents for f/u.  She has significant CAD with stenting x 2 to the LAD and x 1 to the LCx in 2000, rotational arthrectomy to proximal LCx and DES to LAD in 2014 and DES to LCx in 2014, along with DES to LAD for -in-stent stenosis in 2015, and DES to ostial LAD in 2016. PVD is listed in chart but I cannot find further notes regarding this as LE doppler 2014 showed no evidence of significant LE disease   She was admittedin June 2018with exertional dyspnea and chest tightness and palpitations. She was found to be in rapid atrial fib/flutter and minimally elevated troponin. 2D Echo 07/11/16 showed moderate focal basal and mild concentric hypertrophy, EF 50-55%, no RWMA, septal dyssyngery c/w LBBB, mild AI, massively dilated LA, trivial TR, mild PR. Coumadin was held in prep for cath which was performed 07/14/16 showing 100% CTO of OM1 with L-L collaterals, 90% distal Cx, 100% distal PLOM, otherwise nonobstructive disease, normal LVEDP - unsuccessful PCI of the distal Cx due to inability to cross the lesion with a balloon. She was brought back 07/15/16 and underwent successful orbital atherectomy &DES to mid LCx.   She had an episode of Shingles  around Memorial Day 2019. It was on the right side of her chest. She was treated with acyclovir. She still has some itching. She had constipation and nausea. She treated this with peptobismol. She initially thought it was a heart episode, but then with the rash, realized it was shingles. She still has some trouble sleeping.   She had a lot of fatigue with the shingles. SHe had a lot of stress with her son who is an alcoholic.  He had intrabdominal bleeding in 10/19 from tumors. Anticoagulation was stopped for a while and restarted when approval was given by her oncology team.   She has been treated with chemo and has responded well. No bleeding, even after Coumadin was started.  She has had episodes of volume overload managed with diuretics and AFib RVR in early 2021.   She was seen in the ER in 5/21.  Records from Dr. Martinique show: "I spend 45 minutes in direct patient care discussing her underlying cardiac condition, treatment to date and prognosis.  From extensive evaluation to date we really have nothing else to offer her for maintenance of NSR. We may be able to control her HR better and will add digoxin 0.0625 mg daily. Continue Toprol XL. I don't feel that repeating DCCV at this time will offer any benefit. We discussed extensively managing her symptoms at home with medication. With palliative care on board she may benefit from a short acting narcotic for pain or anxiolytic for  her stress/anxiety. She is not currently volume overloaded and from her previous coronary assessment there is really nothing fixable.   I did offer her the option of readmission to the hospital and another EP consultation but she is comfortable with the idea of going home. I really think a comprehensive palliative care approach is appropriate for her. She understands that she has a number of issues for which there is no cure and that our goals of care are more to maintain her independence and quality of  life as much as possible.  Will plan on DC from the ED today and arrange follow up with Dr Irish Lack."     HR better since Digoxin was started.  BP has been stable.  Started on Mg for low Mg level.  Received both COVID vaccines.  Earlier in 2021, she was seen in hospital by Dr. Martinique: "She very much appreciated the goals of care discussion with Dr. Martinique.  She still has [facility] for now. She was signed up for palliative care.  Her anxiety is significantly less.. "  Her cancer treatment is on hold since the chemo risks may outweigh the benefits in regards to her heart issues.   She had COVID in 9/21 despite 2 Moderna shots in 2/21, and received the antibody infusion.   Palliative care nurse coming to her house several times a month.      Past Medical History:  Diagnosis Date  . Bleeding behind the abdominal cavity 10/2017  . Chronic anticoagulation - coumadin, CHADS2VASC=6 05/17/2015  . CKD (chronic kidney disease), stage III ([place])   . Combined systolic and diastolic heart failure ([place])   . Coronary artery disease    a. s/p multiple stents - stenting x 2 to the LAD and x 1 to the LCx in 2000, rotational arthrectomy to proximal LCx and DES to LAD in 2014 and DES to LCx in 2014, along with DES to LAD for re-in-stent stenosis in 2015, and DES to ostial LAD in 2016. b. 07/2016 - orbital atherectomy & DES to mid LCx.  . Diverticulitis   . Granulosa cell carcinoma ([place])    abd; last episode was in 2009  . Hyperlipidemia   . Hypertension   . LBBB (left bundle branch block)   . Myocardial infarction ([place]) 2002  . Obesity   . OSA on CPAP   . Ovarian ca ([place]) 2019  . Pacemaker failure    a. Prior leadless PPM with premature battery failure, being managed conservatively without replacement.  . Peripheral vascular disease ([place])   . Permanent atrial fibrillation ([place]) 2013  . Renal artery stenosis ([place])   . Tachycardia-bradycardia syndrome ([place])    a. s/p leadless pacemaker  ([name]) implanted by Dr [name]  . Varicose veins     Past Surgical History:  Procedure Laterality Date  . ABDOMINAL HYSTERECTOMY    . CARDIAC CATHETERIZATION  09/03/2007   EF 70%; Failed attempt at PCI to OM  . CARDIAC CATHETERIZATION  11/01/2003   EF 70%  . CARDIAC CATHETERIZATION N/A 12/14/2015   Procedure: Left Heart Cath and Coronary Angiography;  Surgeon: Burnell Blanks, MD;  Location: [place] CV LAB;  Service: Cardiovascular;  Laterality: N/A;  . CARDIOVERSION  12/31/2011   Procedure: CARDIOVERSION;  Surgeon: [name], MD;  Location[facility] ENDOSCOPY;  Service: Cardiovascular;  Laterality: N/A;  . CARDIOVERSION N/A 12/31/2011   Procedure: CARDIOVERSION;  Surgeon: [name], MD;  Location: [facility] CATH LAB;  Service: Cardiovascular;  Laterality: N/A;  . CARDIOVERSION N/A 11/15/2018   Procedure: CARDIOVERSION;  Surgeon: Dorothy Spark, MD;  Location: [facility] ENDOSCOPY;  Service: Cardiovascular;  Laterality: N/A;  . CARDIOVERSION N/A 06/10/2019   Procedure: CARDIOVERSION;  Surgeon: Fay Records, MD;  Location: [facility] ENDOSCOPY;  Service: Cardiovascular;  Laterality: N/A;  . CARDIOVERSION N/A 06/14/2019   Procedure: CARDIOVERSION;  Surgeon: Pixie Casino, MD;  Location: [place];  Service: Cardiovascular;  Laterality: N/A;  . CATARACT EXTRACTION, BILATERAL  2015  . CHOLECYSTECTOMY  1980's  . COLON SURGERY  2004   colectomy for diverticulosis  . CORONARY ANGIOPLASTY WITH STENT PLACEMENT  2000    and 08/11/2012; 11/12/2012: 3 + 2 LAD & CFX; 2nd CFX stent 11/12/2012  . CORONARY ATHERECTOMY N/A 07/15/2016   Procedure: Coronary Atherectomy;  Surgeon: Martinique, Peter M, MD;  Location: [place] CV LAB;  Service: Cardiovascular;  Laterality: N/A;  . CORONARY BALLOON ANGIOPLASTY N/A 07/14/2016   Procedure: Coronary Balloon Angioplasty;  Surgeon: Martinique, Peter M, MD;  Location: [place] CV LAB;  Service: Cardiovascular;  Laterality: N/A;  . CORONARY STENT  INTERVENTION N/A 07/15/2016   Procedure: Coronary Stent Intervention;  Surgeon: Martinique, Peter M, MD;  Location: [place] CV LAB;  Service: Cardiovascular;  Laterality: N/A;  . FRACTIONAL FLOW RESERVE WIRE  10/07/2013   Procedure: [place];  Surgeon: [name], MD;  Location: [facility] CATH LAB;  Service: Cardiovascular;;  . HERNIA REPAIR  2005   "laparoscopic"  . IR IMAGING GUIDED PORT INSERTION  10/28/2017  . LEFT HEART CATH AND CORONARY ANGIOGRAPHY N/A 07/14/2016   Procedure: Left Heart Cath and Coronary Angiography;  Surgeon: Martinique, Peter M, MD;  Location: [place] CV LAB;  Service: Cardiovascular;  Laterality: N/A;  . LEFT HEART CATH AND CORONARY ANGIOGRAPHY N/A 11/10/2018   Procedure: LEFT HEART CATH AND CORONARY ANGIOGRAPHY;  Surgeon: Wellington Hampshire, MD;  Location: [place] CV LAB;  Service: Cardiovascular;  Laterality: N/A;  . LEFT HEART CATHETERIZATION WITH CORONARY ANGIOGRAM N/A 11/12/2012   Procedure: LEFT HEART CATHETERIZATION WITH CORONARY ANGIOGRAM;  Surgeon: [name], MD;  Location: [facility] CATH LAB;  Service: Cardiovascular;  Laterality: N/A;  . LEFT HEART CATHETERIZATION WITH CORONARY ANGIOGRAM N/A 10/07/2013   Procedure: LEFT HEART CATHETERIZATION WITH CORONARY ANGIOGRAM;  Surgeon: [name], MD;  Location: [facility] CATH LAB;  Service: Cardiovascular;  Laterality: N/A;  . LEFT HEART CATHETERIZATION WITH CORONARY ANGIOGRAM N/A 12/14/2013   Procedure: LEFT HEART CATHETERIZATION WITH CORONARY ANGIOGRAM;  Surgeon: Sinclair Grooms, MD;  Location: [facility] CATH LAB;  Service: Cardiovascular;  Laterality: N/A;  . LEFT HEART CATHETERIZATION WITH CORONARY ANGIOGRAM N/A 05/16/2014   Procedure: LEFT HEART CATHETERIZATION WITH CORONARY ANGIOGRAM;  Surgeon: Sherren Mocha, MD;  Location: [facility] CATH LAB;  Service: Cardiovascular;  Laterality: N/A;  . PERCUTANEOUS CORONARY INTERVENTION-BALLOON ONLY  08/04/2012   Procedure: PERCUTANEOUS CORONARY INTERVENTION-BALLOON  ONLY;  Surgeon: [name], MD;  Location: [facility] CATH LAB;  Service: Cardiovascular;;  . PERCUTANEOUS CORONARY ROTOBLATOR INTERVENTION (PCI-R) N/A 08/11/2012   Procedure: PERCUTANEOUS CORONARY ROTOBLATOR INTERVENTION (PCI-R);  Surgeon: [name], MD;  Location: [facility] CATH LAB;  Service: Cardiovascular;  Laterality: N/A;  . PERCUTANEOUS CORONARY STENT INTERVENTION (PCI-S)  10/07/2013   Procedure: PERCUTANEOUS CORONARY STENT INTERVENTION (PCI-S);  Surgeon: [name], MD;  Location: [facility] CATH LAB;  Service: Cardiovascular;;  . PERMANENT PACEMAKER INSERTION N/A 03/16/2012   [name] (SJM) leadless pacemaker (LEADLESS II STUDY PATEINT)  . SALIVARY GLAND SURGERY  2000's   "had a little lump removed; granulosa related; it was benign" (08/11/2012)  . TEE WITHOUT CARDIOVERSION  12/31/2011   Procedure: TRANSESOPHAGEAL ECHOCARDIOGRAM (TEE);  Surgeon: [name], MD;  Location: [facility] ENDOSCOPY;  Service: Cardiovascular;  Laterality: N/A;  . UMBILICAL GRANULOMA EXCISION  2000   2003; 2004; 2007: "all in my abdomen including small intestines, outside my ?uterus/etc" (08/11/2012)  . VARICOSE VEIN SURGERY Bilateral 1977     Current Outpatient Medications  Medication Sig Dispense Refill  . acetaminophen (TYLENOL) 500 MG tablet Take 500 mg by mouth every 6 (six) hours as needed for mild pain or headache.     Marland Kitchen atorvastatin (LIPITOR) 80 MG tablet TAKE 1 TABLET BY MOUTH EVERY DAY IN THE MORNING 90 tablet 3  . beta carotene w/minerals (OCUVITE) tablet Take 1 tablet by mouth 2 (two) times daily.     . clopidogrel (PLAVIX) 75 MG tablet TAKE 1 TABLET BY MOUTH EVERY DAY 30 tablet 11  . Coenzyme Q-10 100 MG capsule Take 200 mg by mouth at bedtime.     . Digoxin 62.5 MCG TABS Take 0.0625 mg by mouth daily. 30 tablet 11  . dofetilide (TIKOSYN) 125 MCG capsule TAKE 1 CAPSULE BY MOUTH TWICE A DAY 60 capsule 6  . furosemide (LASIX) 40 MG tablet TAKE 2 TABLETS BY MOUTH EVERY MORNING AND 2 TABLETS AS  NEEDED FOR WEIGHT GAIN/ABDOMINAL DISTENSION 120 tablet 4  . gabapentin (NEURONTIN) 300 MG capsule Take 1 capsule (300 mg total) by mouth 2 (two) times daily. 60 capsule 1  . isosorbide mononitrate (IMDUR) 60 MG 24 hr tablet Take 1.5 tablets (90 mg total) by mouth daily. 45 tablet 9  . magnesium oxide (MAG-OX) 400 (241.3 Mg) MG tablet Take 2 tablets (800 mg total) by mouth daily. 60 tablet 6  . metoprolol (TOPROL-XL) 200 MG 24 hr tablet TAKE 1 TABLET (200 MG TOTAL) BY MOUTH DAILY. TAKE WITH OR IMMEDIATELY FOLLOWING A MEAL. 30 tablet 3  . Multiple Vitamin (MULTIVITAMIN) capsule Take 1 capsule by mouth daily.    . nitroGLYCERIN (NITROSTAT) 0.4 MG SL tablet PLACE 1 TABLET (0.4 MG TOTAL) UNDER THE TONGUE EVERY 5 (FIVE) MINUTES AS NEEDED FOR CHEST PAIN 75 tablet 2  . potassium chloride SA (KLOR-CON) 20 MEQ tablet Take 1 tablet (20 mEq total) by mouth 2 (two) times daily. 180 tablet 3  . Rivaroxaban (XARELTO) 15 MG TABS tablet Take 1 tablet (15 mg total) by mouth daily with supper. 30 tablet 5   No current facility-administered medications for this visit.    Allergies:   Bee venom, Other, Oxycodone hcl, Amiodarone, Chlorhexidine, and Prednisone    Social History:  The patient  reports that she quit smoking about 45 years ago. Her smoking use included cigarettes. She has a 32.00 pack-year smoking history. She has never used smokeless tobacco. She reports current alcohol use of about 7.0 standard drinks of alcohol per week. She reports that she does not use drugs.   Family History:  The patient's family history includes Diabetes in her brother and father; Heart attack in her brother and father; Hypertension in her brother and father; Kidney failure in her brother; Stroke in her mother.    ROS:  Please see the history of present illness.   Otherwise, review of systems are positive for balance issues- uses a cane.   All other systems are reviewed and negative.    PHYSICAL EXAM: VS:  BP (!) 148/60    Pulse 77   Ht  5' (1.524 m)   Wt 170 lb 6.4 oz (77.3 kg)   SpO2 94%   BMI 33.28 kg/m  , BMI Body mass index is 33.28 kg/m. GEN: Well nourished, well developed, in no acute distress  HEENT: normal  Neck: no JVD, carotid bruits, or masses Cardiac: RRR; 2/6 systolic murmur, norubs, or gallops,no edema  Respiratory:  clear to auscultation bilaterally, normal work of breathing GI: soft, nontender, nondistended, + BS MS: no deformity or atrophy  Skin: warm and dry, no rash Neuro:  Strength and sensation are intact Psych: euthymic mood, full affect   EKG:   The ekg ordered today demonstrates    Recent Labs: 03/22/2019: NT-Pro BNP 3,420 05/31/2019: ALT 22 06/09/2019: B Natriuretic Peptide 1,092.6 06/22/2019: Magnesium 2.0 08/15/2019: BUN 22; Creatinine, Ser 1.10; Hemoglobin 12.8; Platelets 196; Potassium 3.9; Sodium 145   Lipid Panel    Component Value Date/Time   CHOL 112 11/10/2018 0536   CHOL 132 10/08/2017 0905   TRIG 69 11/10/2018 0536   HDL 36 (L) 11/10/2018 0536   HDL 66 10/08/2017 0905   CHOLHDL 3.1 11/10/2018 0536   VLDL 14 11/10/2018 0536   LDLCALC 62 11/10/2018 0536   LDLCALC 52 10/08/2017 0905     Other studies Reviewed: Additional studies/ records that were reviewed today with results demonstrating: 9/21 labs reviewed; Hbg, Cr, ALT reviewed and all normal .   ASSESSMENT AND PLAN:  1. CAD: s/p multiple PCI.  No angina on medical therapy.  Continue aggressive secondary prevention.   2. AFib: Rate control meds and Tikosyn.  Will check if dig would cause her once a day diarrhea.  No bleeding issues on Xarelto.  3. Chronic diastolic heart failure: Appears euvolemic. 4. S/p Pacer:  Follows with EP.   5. CKD: Stage III. Avoid nephrotoxins.  6. HTN: Borderline readings today.    BP at home in the 130s. 7. Hyperlipidemia: Controlled in 11/2018.  Continue statin.  8. Wants a prescription for a walker.     Current medicines are reviewed at length with the patient today.   The patient concerns regarding her medicines were addressed.  The following changes have been made:  No change  Labs/ tests ordered today include:  No orders of the defined types were placed in this encounter.   Recommend 150 minutes/week of aerobic exercise Low fat, low carb, high fiber diet recommended  Disposition:   FU in 6 months   Signed, Larae Grooms, MD  12/02/2019 3:23 PM    [facility] [address], [place], [place]  [address] Phone: [phone]; Fax: [phone]

## 2019-12-02 ENCOUNTER — Ambulatory Visit (INDEPENDENT_AMBULATORY_CARE_PROVIDER_SITE_OTHER): Payer: Medicare Other | Admitting: Interventional Cardiology

## 2019-12-02 ENCOUNTER — Other Ambulatory Visit: Payer: Self-pay

## 2019-12-02 ENCOUNTER — Encounter: Payer: Self-pay | Admitting: Interventional Cardiology

## 2019-12-02 VITALS — BP 148/60 | HR 77 | Ht 60.0 in | Wt 170.4 lb

## 2019-12-02 DIAGNOSIS — I200 Unstable angina: Secondary | ICD-10-CM

## 2019-12-02 DIAGNOSIS — I5042 Chronic combined systolic (congestive) and diastolic (congestive) heart failure: Secondary | ICD-10-CM | POA: Diagnosis not present

## 2019-12-02 DIAGNOSIS — N183 Chronic kidney disease, stage 3 unspecified: Secondary | ICD-10-CM

## 2019-12-02 DIAGNOSIS — I10 Essential (primary) hypertension: Secondary | ICD-10-CM | POA: Diagnosis not present

## 2019-12-02 DIAGNOSIS — E782 Mixed hyperlipidemia: Secondary | ICD-10-CM

## 2019-12-02 DIAGNOSIS — I25118 Atherosclerotic heart disease of native coronary artery with other forms of angina pectoris: Secondary | ICD-10-CM | POA: Diagnosis not present

## 2019-12-02 DIAGNOSIS — I519 Heart disease, unspecified: Secondary | ICD-10-CM

## 2019-12-02 DIAGNOSIS — I4819 Other persistent atrial fibrillation: Secondary | ICD-10-CM | POA: Diagnosis not present

## 2019-12-02 NOTE — Patient Instructions (Signed)
Medication Instructions:  Your physician recommends that you continue on your current medications as directed. Please refer to the Current Medication list given to you today.  *If you need a refill on your cardiac medications before your next appointment, please call your pharmacy*   Lab Work: None  If you have labs (blood work) drawn today and your tests are completely normal, you will receive your results only by: . MyChart Message (if you have MyChart) OR . A paper copy in the mail If you have any lab test that is abnormal or we need to change your treatment, we will call you to review the results.   Testing/Procedures: None  Follow-Up: At [facility], you and your health needs are our priority.  As part of our continuing mission to provide you with exceptional heart care, we have created designated Provider Care Teams.  These Care Teams include your primary Cardiologist (physician) and Advanced Practice Providers (APPs -  Physician Assistants and Nurse Practitioners) who all work together to provide you with the care you need, when you need it.  We recommend signing up for the patient portal called "MyChart".  Sign up information is provided on this After Visit Summary.  MyChart is used to connect with patients for Virtual Visits (Telemedicine).  Patients are able to view lab/test results, encounter notes, upcoming appointments, etc.  Non-urgent messages can be sent to your provider as well.   To learn more about what you can do with MyChart, go to [email or web address].    Your next appointment:   6 month(s)  The format for your next appointment:   In Person  Provider:   You may see Jayadeep Varanasi, MD or one of the following Advanced Practice Providers on your designated Care Team:    Dayna Dunn, PA-C  Michele Lenze, PA-C    Other Instructions None  

## 2019-12-06 ENCOUNTER — Telehealth: Payer: Self-pay

## 2019-12-06 MED ORDER — ROSUVASTATIN CALCIUM 40 MG PO TABS: 40.0000 mg | ORAL_TABLET | Freq: Every day | ORAL | 3 refills | Status: DC

## 2019-12-06 NOTE — Telephone Encounter (Signed)
-----   Message from [name], MD sent at 12/03/2019 10:20 PM EDT ----- Can try switching to rosuvastatin 40 mg daily from atorvastatin, to help with diarrhea.  JV ----- Message ----- From: Leeroy Bock, RPH-CPP Sent: 12/02/2019   4:34 PM EDT To: [name], MD  The most likely culprit would be her magnesium supplement. Not sure that we'd be able to d/c it since she's on Tikosyn. Could potentially try reducing the dose down to 400mg  but would need to recheck Mg levels in a few weeks. Atorvastatin also has a higher incidence of causing GI issues (reported 7-14%). Could try changing to rosuvastatin which has a lower incidence of GI side effects. Based on the timing of her symptoms, digoxin could be contributing too. Diarrhea is a possible side effect but reported rates are a bit lower (2-3% in trials).  Thanks, Jinny Blossom ----- Message ----- From: [name], MD Sent: 12/02/2019   4:17 PM EDT To: Leeroy Bock, RPH-CPP  Hi Megan. Can any of her meds cause diarrhea.  Occurs an hour after she takes her digoxin, daily.

## 2019-12-06 NOTE — Telephone Encounter (Signed)
Called and made patient aware of recommendations below. She agrees to switch the atorvastatin to rosuvastatin. Rx sent to preferred pharmacy.

## 2019-12-11 ENCOUNTER — Other Ambulatory Visit (HOSPITAL_COMMUNITY): Payer: Self-pay | Admitting: Nurse Practitioner

## 2019-12-22 ENCOUNTER — Telehealth: Payer: Self-pay | Admitting: Interventional Cardiology

## 2019-12-22 NOTE — Telephone Encounter (Signed)
Left message letting patient know that since she still has a few more pills that I will look into which pharmacies have it and will call her tomorrow with the information.

## 2019-12-22 NOTE — Telephone Encounter (Signed)
Pt c/o medication issue:  1. Name of Medication: Digoxin 62.5 MCG TABS  2. How are you currently taking this medication (dosage and times per day)? 1 tablet a day  3. Are you having a reaction (difficulty breathing--STAT)? no  4. What is your medication issue? Patient states she has 4 days left of the [facility] She states the pharmacy told her they [facility] and they are also all [facility] of stock. She would like to know if she needs to be prescribed a different medication, skip taking it and wait, or if there is another pharmacy that may have it. She states she is leaving town Sunday and will be gone for a week in [facility]. She states she is not sure if there are any pharmacies down there that may have the medication.

## 2019-12-23 MED ORDER — DIGOXIN 125 MCG PO TABS: 0.0625 mg | ORAL_TABLET | Freq: Every day | ORAL | 2 refills | Status: DC

## 2019-12-23 NOTE — Telephone Encounter (Signed)
Called and spoke to patient and made her aware that I was unable to find a pharamacy that had the 62.5 mcg tablets. Made her aware that the [facility] on [address] have a 125 mcg tablets that she can cut in half to take 62.5 mcg QD. Patient agrees to this plan and appreciated the call. Rx sent in.

## 2019-12-26 ENCOUNTER — Ambulatory Visit: Payer: Medicare Other | Admitting: Podiatry

## 2020-01-03 ENCOUNTER — Other Ambulatory Visit: Payer: Self-pay | Admitting: Internal Medicine

## 2020-01-04 ENCOUNTER — Other Ambulatory Visit: Payer: Self-pay

## 2020-01-04 ENCOUNTER — Ambulatory Visit (INDEPENDENT_AMBULATORY_CARE_PROVIDER_SITE_OTHER): Payer: Medicare Other | Admitting: Podiatry

## 2020-01-04 ENCOUNTER — Encounter: Payer: Self-pay | Admitting: Podiatry

## 2020-01-04 DIAGNOSIS — Z7901 Long term (current) use of anticoagulants: Secondary | ICD-10-CM | POA: Diagnosis not present

## 2020-01-04 DIAGNOSIS — M79676 Pain in unspecified toe(s): Secondary | ICD-10-CM | POA: Diagnosis not present

## 2020-01-04 DIAGNOSIS — B351 Tinea unguium: Secondary | ICD-10-CM | POA: Diagnosis not present

## 2020-01-04 DIAGNOSIS — D689 Coagulation defect, unspecified: Secondary | ICD-10-CM | POA: Diagnosis not present

## 2020-01-04 DIAGNOSIS — G620 Drug-induced polyneuropathy: Secondary | ICD-10-CM | POA: Diagnosis not present

## 2020-01-04 DIAGNOSIS — T451X5A Adverse effect of antineoplastic and immunosuppressive drugs, initial encounter: Secondary | ICD-10-CM

## 2020-01-04 NOTE — Progress Notes (Signed)
This patient returns to my office for at risk foot care.  This patient requires this care by a professional since this patient will be at risk due to having peripheral neuropathy, and coagulation defect.  Patient is taking plavix  This patient is unable to cut nails himself since the patient cannot reach his nails.These nails are painful walking and wearing shoes.  This patient presents for at risk foot care today.  General Appearance  Alert, conversant and in no acute stress.  Vascular  Dorsalis pedis and posterior tibial  pulses are palpable  bilaterally.  Capillary return is within normal limits  bilaterally. Temperature is within normal limits  bilaterally.  Neurologic  Senn-Weinstein monofilament wire test within normal limits  bilaterally. Muscle power within normal limits bilaterally.  Nails Thick disfigured discolored nails with subungual debris  from hallux to fifth toes bilaterally. No evidence of bacterial infection or drainage bilaterally.  Orthopedic  No limitations of motion  feet .  No crepitus or effusions noted.  No bony pathology or digital deformities noted.  HAV  B/L.  Skin  normotropic skin with no porokeratosis noted bilaterally.  No signs of infections or ulcers noted.  Pinch callus left hallux.   Onychomycosis  Pain in right toes  Pain in left toes  Consent was obtained for treatment procedures.   Mechanical debridement of nails 1-5  bilaterally performed with a nail nipper.  Filed with dremel without incident.    Return office visit   10 weeks                  Told patient to return for periodic foot care and evaluation due to potential at risk complications.   Gardiner Barefoot DPM

## 2020-01-05 ENCOUNTER — Encounter (HOSPITAL_COMMUNITY): Payer: Self-pay | Admitting: Nurse Practitioner

## 2020-01-05 ENCOUNTER — Ambulatory Visit (HOSPITAL_COMMUNITY)
Admission: RE | Admit: 2020-01-05 | Discharge: 2020-01-05 | Disposition: A | Discharge status: Home / Self Care | Payer: Medicare Other | Source: Ambulatory Visit | Attending: Nurse Practitioner | Admitting: Nurse Practitioner

## 2020-01-05 VITALS — BP 168/64 | HR 60 | Ht 60.0 in | Wt 172.2 lb

## 2020-01-05 DIAGNOSIS — Z79899 Other long term (current) drug therapy: Secondary | ICD-10-CM | POA: Diagnosis not present

## 2020-01-05 DIAGNOSIS — Z7901 Long term (current) use of anticoagulants: Secondary | ICD-10-CM | POA: Diagnosis not present

## 2020-01-05 DIAGNOSIS — N183 Chronic kidney disease, stage 3 unspecified: Secondary | ICD-10-CM | POA: Insufficient documentation

## 2020-01-05 DIAGNOSIS — I5042 Chronic combined systolic (congestive) and diastolic (congestive) heart failure: Secondary | ICD-10-CM | POA: Insufficient documentation

## 2020-01-05 DIAGNOSIS — E785 Hyperlipidemia, unspecified: Secondary | ICD-10-CM | POA: Insufficient documentation

## 2020-01-05 DIAGNOSIS — I252 Old myocardial infarction: Secondary | ICD-10-CM | POA: Insufficient documentation

## 2020-01-05 DIAGNOSIS — I130 Hypertensive heart and chronic kidney disease with heart failure and stage 1 through stage 4 chronic kidney disease, or unspecified chronic kidney disease: Secondary | ICD-10-CM | POA: Diagnosis not present

## 2020-01-05 DIAGNOSIS — Z7902 Long term (current) use of antithrombotics/antiplatelets: Secondary | ICD-10-CM | POA: Diagnosis not present

## 2020-01-05 DIAGNOSIS — I4819 Other persistent atrial fibrillation: Secondary | ICD-10-CM

## 2020-01-05 DIAGNOSIS — Z87891 Personal history of nicotine dependence: Secondary | ICD-10-CM | POA: Diagnosis not present

## 2020-01-05 DIAGNOSIS — I4821 Permanent atrial fibrillation: Secondary | ICD-10-CM | POA: Diagnosis not present

## 2020-01-05 DIAGNOSIS — Z8616 Personal history of COVID-19: Secondary | ICD-10-CM | POA: Diagnosis not present

## 2020-01-05 DIAGNOSIS — I2510 Atherosclerotic heart disease of native coronary artery without angina pectoris: Secondary | ICD-10-CM | POA: Insufficient documentation

## 2020-01-05 DIAGNOSIS — Z955 Presence of coronary angioplasty implant and graft: Secondary | ICD-10-CM | POA: Diagnosis not present

## 2020-01-05 DIAGNOSIS — I447 Left bundle-branch block, unspecified: Secondary | ICD-10-CM | POA: Diagnosis not present

## 2020-01-05 DIAGNOSIS — D6869 Other thrombophilia: Secondary | ICD-10-CM

## 2020-01-05 DIAGNOSIS — G4733 Obstructive sleep apnea (adult) (pediatric): Secondary | ICD-10-CM | POA: Insufficient documentation

## 2020-01-05 LAB — BASIC METABOLIC PANEL
Anion gap: 6 (ref 5–15)
BUN: 14 mg/dL (ref 8–23)
CO2: 32 mmol/L (ref 22–32)
Calcium: 8.4 mg/dL — ABNORMAL LOW (ref 8.9–10.3)
Chloride: 101 mmol/L (ref 98–111)
Creatinine, Ser: 0.91 mg/dL (ref 0.44–1.00)
GFR, Estimated: >60 mL/min (ref >60)
Glucose, Bld: 92 mg/dL (ref 70–99)
Potassium: 3.6 mmol/L (ref 3.5–5.1)
Sodium: 139 mmol/L (ref 135–145)

## 2020-01-05 LAB — MAGNESIUM: Magnesium: 2.4 mg/dL (ref 1.7–2.4)

## 2020-01-05 MED ORDER — POTASSIUM CHLORIDE CRYS ER 20 MEQ PO TBCR: 20.0000 meq | EXTENDED_RELEASE_TABLET | Freq: Two times a day (BID) | ORAL | 2 refills | Status: DC

## 2020-01-05 NOTE — Progress Notes (Signed)
Primary Care Physician: [name], MD Referring Physician: Dr. Aurea Graff Patty [name] is a 86 y.o. female with a h/o permanent afib, CAD, HTN, HLD, LBBB, tachy-brady with w/ PPM, OSA, that presented to [facility] with afib with RVR, and HF symptoms.  She was diuresed, LHC with stable disease, NSTEMI thought to be 2/2 demand ischemia forn RVR.CCB was stopped and she was transitioned to BB for worsening EF to 40-45%. Her RVR persisted despite aggressive rate control and it was decided to start tikosyn. DCCV restored SR, but had ERAF/atial tach.   In the afib clinic today she feels improved. Fluid status stable, ekg appears to be Sinus rhythm( a paced). Taking tikosyn appropriately. Continues on warfarin with a CHA2DS2VASc score of  at least 6. She hopes to spend Thanksgiving to Christmas with her daughter/son-in-law in the [place] area.   F/u in afib clinic, 10/29.  She called as she started having some chest discomfort and felt the need to take a NTG x 4 in the last 48 hours. Report sent to device clinic yesterday showed atrial flutter. She is not having chest pain today. Discussed with Dr. [name] and he suggested to try to pace terminate. Aaron Edelman with [employer] was asked to come to clinic to  do this.. It was not successful. He felt the pt was in an atrial tachycardia at atrial rate of 200 bpm. He discussed with Dr. [name] and it was decided to mode change from DDD to DDI 2/2 atrial undersensing which ultimately lead to ventricular pacing at 120 bpm( this is thought to have triggered her chest discomfort as replicated in the office today). Her lower rate was reduced to 40 BPM, which her intrinsic presently is 90 bpm.   F/u clinic 12/06/18. She is interrogated today and remains in atrial tach with v rate in the nineties. She  does not seem to be having as high of v rates as last week with 85% of time v rate in the 90's. Atrial rate around 200 bpm. She still reports some chest pressure in the am. She will  rest and take a NTG s/l and will feel a need to take another one around bedtime. She continues with her busy daytime routine in between.  I can not correlate  if chest pressure is  with higher v rates.   F/u in afib clinic, 02/15/19. She just got back from spending 6 weeks with her daughter and son in law in [place] over the holidays. She had a very good visit. She  only had to take 2 NTG while there. She has  not had any heart racing in a while.  EKG today show a paced rhythm. Continues on warfarin with a CHA2DS2VASc score of at least 6.   F/u in afib clinic, 06/22/19. She has had 2 recent hospitalizations. The first one was for shortness of breath and chest discomfort. Her meds were tweaked. Then in just a few days she went back in with similar symptoms and was out of rhythm. She  Had 2 cardioversion but ultimately was d/c in rhythm. On f/u here she remains in [place] and feels much improved. She is a paced today. Her warfarin was changed to xarelto 20 mg daily. Continues on dofetilide  125  mcg bid.   F/u in afib clinic, 01/05/20. Pt is in SR, she went thru several months with a higher afib burden but it is now maintaining  SR. She remains on Tikosyn. No issues with xarelto. CHA2DS2VASc  score of 6. . She did have covid in October, received monoclonal antibodies and fared well at home without any hospitalization. A palliative nurse still comes into the home every 2 weeks. She  is still living independently and drives.   Today, she denies symptoms of palpitations, chest pain, shortness of breath, orthopnea, PND, lower extremity edema, dizziness, presyncope, syncope, or neurologic sequela. The patient is tolerating medications without difficulties and is otherwise without complaint today.   Past Medical History:  Diagnosis Date  . Bleeding behind the abdominal cavity 10/2017  . Chronic anticoagulation - coumadin, CHADS2VASC=6 05/17/2015  . CKD (chronic kidney disease), stage III ([place])   . Combined systolic  and diastolic heart failure ([place])   . Coronary artery disease    a. s/p multiple stents - stenting x 2 to the LAD and x 1 to the LCx in 2000, rotational arthrectomy to proximal LCx and DES to LAD in 2014 and DES to LCx in 2014, along with DES to LAD for re-in-stent stenosis in 2015, and DES to ostial LAD in 2016. b. 07/2016 - orbital atherectomy & DES to mid LCx.  . Diverticulitis   . Granulosa cell carcinoma ([place])    abd; last episode was in 2009  . Hyperlipidemia   . Hypertension   . LBBB (left bundle branch block)   . Myocardial infarction ([place]) 2002  . Obesity   . OSA on CPAP   . Ovarian ca ([place]) 2019  . Pacemaker failure    a. Prior leadless PPM with premature battery failure, being managed conservatively without replacement.  . Peripheral vascular disease ([place])   . Permanent atrial fibrillation ([place]) 2013  . Renal artery stenosis ([place])   . Tachycardia-bradycardia syndrome ([place])    a. s/p leadless pacemaker ([name]) implanted by Dr [name]  . Varicose veins    Past Surgical History:  Procedure Laterality Date  . ABDOMINAL HYSTERECTOMY    . CARDIAC CATHETERIZATION  09/03/2007   EF 70%; Failed attempt at PCI to OM  . CARDIAC CATHETERIZATION  11/01/2003   EF 70%  . CARDIAC CATHETERIZATION N/A 12/14/2015   Procedure: Left Heart Cath and Coronary Angiography;  Surgeon: Burnell Blanks, MD;  Location: [place] CV LAB;  Service: Cardiovascular;  Laterality: N/A;  . CARDIOVERSION  12/31/2011   Procedure: CARDIOVERSION;  Surgeon: [name], MD;  Location: [facility] ENDOSCOPY;  Service: Cardiovascular;  Laterality: N/A;  . CARDIOVERSION N/A 12/31/2011   Procedure: CARDIOVERSION;  Surgeon: [name], MD;  Location: [facility] CATH LAB;  Service: Cardiovascular;  Laterality: N/A;  . CARDIOVERSION N/A 11/15/2018   Procedure: CARDIOVERSION;  Surgeon: Dorothy Spark, MD;  Location: [facility] ENDOSCOPY;  Service: Cardiovascular;  Laterality: N/A;  . CARDIOVERSION N/A 06/10/2019    Procedure: CARDIOVERSION;  Surgeon: Fay Records, MD;  Location: [facility] ENDOSCOPY;  Service: Cardiovascular;  Laterality: N/A;  . CARDIOVERSION N/A 06/14/2019   Procedure: CARDIOVERSION;  Surgeon: Pixie Casino, MD;  Location: [place];  Service: Cardiovascular;  Laterality: N/A;  . CATARACT EXTRACTION, BILATERAL  2015  . CHOLECYSTECTOMY  1980's  . COLON SURGERY  2004   colectomy for diverticulosis  . CORONARY ANGIOPLASTY WITH STENT PLACEMENT  2000    and 08/11/2012; 11/12/2012: 3 + 2 LAD & CFX; 2nd CFX stent 11/12/2012  . CORONARY ATHERECTOMY N/A 07/15/2016   Procedure: Coronary Atherectomy;  Surgeon: Martinique, Peter M, MD;  Location: [place] CV LAB;  Service: Cardiovascular;  Laterality: N/A;  . CORONARY BALLOON ANGIOPLASTY N/A 07/14/2016  Procedure: Coronary Balloon Angioplasty;  Surgeon: Martinique, Peter M, MD;  Location: [place] CV LAB;  Service: Cardiovascular;  Laterality: N/A;  . CORONARY STENT INTERVENTION N/A 07/15/2016   Procedure: Coronary Stent Intervention;  Surgeon: Martinique, Peter M, MD;  Location: [place] CV LAB;  Service: Cardiovascular;  Laterality: N/A;  . FRACTIONAL FLOW RESERVE WIRE  10/07/2013   Procedure: [place];  Surgeon: [name], MD;  Location: [facility] CATH LAB;  Service: Cardiovascular;;  . HERNIA REPAIR  2005   "laparoscopic"  . IR IMAGING GUIDED PORT INSERTION  10/28/2017  . LEFT HEART CATH AND CORONARY ANGIOGRAPHY N/A 07/14/2016   Procedure: Left Heart Cath and Coronary Angiography;  Surgeon: Martinique, Peter M, MD;  Location: [place] CV LAB;  Service: Cardiovascular;  Laterality: N/A;  . LEFT HEART CATH AND CORONARY ANGIOGRAPHY N/A 11/10/2018   Procedure: LEFT HEART CATH AND CORONARY ANGIOGRAPHY;  Surgeon: Wellington Hampshire, MD;  Location: [place] CV LAB;  Service: Cardiovascular;  Laterality: N/A;  . LEFT HEART CATHETERIZATION WITH CORONARY ANGIOGRAM N/A 11/12/2012   Procedure: LEFT HEART CATHETERIZATION WITH CORONARY  ANGIOGRAM;  Surgeon: [name], MD;  Location: [facility] CATH LAB;  Service: Cardiovascular;  Laterality: N/A;  . LEFT HEART CATHETERIZATION WITH CORONARY ANGIOGRAM N/A 10/07/2013   Procedure: LEFT HEART CATHETERIZATION WITH CORONARY ANGIOGRAM;  Surgeon: [name], MD;  Location: [facility] CATH LAB;  Service: Cardiovascular;  Laterality: N/A;  . LEFT HEART CATHETERIZATION WITH CORONARY ANGIOGRAM N/A 12/14/2013   Procedure: LEFT HEART CATHETERIZATION WITH CORONARY ANGIOGRAM;  Surgeon: Sinclair Grooms, MD;  Location: [facility] CATH LAB;  Service: Cardiovascular;  Laterality: N/A;  . LEFT HEART CATHETERIZATION WITH CORONARY ANGIOGRAM N/A 05/16/2014   Procedure: LEFT HEART CATHETERIZATION WITH CORONARY ANGIOGRAM;  Surgeon: Sherren Mocha, MD;  Location: [facility] CATH LAB;  Service: Cardiovascular;  Laterality: N/A;  . PERCUTANEOUS CORONARY INTERVENTION-BALLOON ONLY  08/04/2012   Procedure: PERCUTANEOUS CORONARY INTERVENTION-BALLOON ONLY;  Surgeon: [name], MD;  Location: [facility] CATH LAB;  Service: Cardiovascular;;  . PERCUTANEOUS CORONARY ROTOBLATOR INTERVENTION (PCI-R) N/A 08/11/2012   Procedure: PERCUTANEOUS CORONARY ROTOBLATOR INTERVENTION (PCI-R);  Surgeon: [name], MD;  Location: [facility] CATH LAB;  Service: Cardiovascular;  Laterality: N/A;  . PERCUTANEOUS CORONARY STENT INTERVENTION (PCI-S)  10/07/2013   Procedure: PERCUTANEOUS CORONARY STENT INTERVENTION (PCI-S);  Surgeon: [name], MD;  Location: [facility] CATH LAB;  Service: Cardiovascular;;  . PERMANENT PACEMAKER INSERTION N/A 03/16/2012   [name] (SJM) leadless pacemaker (LEADLESS II STUDY PATEINT)  . SALIVARY GLAND SURGERY  2000's   "had a little lump removed; granulosa related; it was benign" (08/11/2012)  . TEE WITHOUT CARDIOVERSION  12/31/2011   Procedure: TRANSESOPHAGEAL ECHOCARDIOGRAM (TEE);  Surgeon: [name], MD;  Location: [facility] ENDOSCOPY;  Service: Cardiovascular;  Laterality: N/A;  . UMBILICAL GRANULOMA EXCISION  2000    2003; 2004; 2007: "all in my abdomen including small intestines, outside my ?uterus/etc" (08/11/2012)  . VARICOSE VEIN SURGERY Bilateral 1977    Current Outpatient Medications  Medication Sig Dispense Refill  . acetaminophen (TYLENOL) 500 MG tablet Take 500 mg by mouth every 6 (six) hours as needed for mild pain or headache.     . beta carotene w/minerals (OCUVITE) tablet Take 1 tablet by mouth 2 (two) times daily.     . clopidogrel (PLAVIX) 75 MG tablet TAKE 1 TABLET BY MOUTH EVERY DAY 30 tablet 11  . Coenzyme Q-10 100 MG capsule Take 200 mg by mouth daily.     Marland Kitchen  digoxin (LANOXIN) 0.125 MG tablet Take 0.5 tablets (0.0625 mg total) by mouth daily. 30 tablet 2  . dofetilide (TIKOSYN) 125 MCG capsule TAKE 1 CAPSULE BY MOUTH TWICE A DAY 60 capsule 6  . furosemide (LASIX) 40 MG tablet TAKE 2 TABLETS BY MOUTH EVERY MORNING AND 2 TABLETS AS NEEDED FOR WEIGHT GAIN/ABDOMINAL DISTENSION 120 tablet 4  . gabapentin (NEURONTIN) 300 MG capsule Take 1 capsule (300 mg total) by mouth 2 (two) times daily. 60 capsule 1  . isosorbide mononitrate (IMDUR) 60 MG 24 hr tablet Take 1.5 tablets (90 mg total) by mouth daily. 45 tablet 9  . magnesium oxide (MAG-OX) 400 (241.3 Mg) MG tablet Take 2 tablets (800 mg total) by mouth daily. 60 tablet 6  . metoprolol (TOPROL-XL) 200 MG 24 hr tablet TAKE 1 TABLET DAILY WITH OR IMMEDIATELY FOLLOWING A MEAL. 90 tablet 3  . Multiple Vitamin (MULTIVITAMIN) capsule Take 1 capsule by mouth daily.    . nitroGLYCERIN (NITROSTAT) 0.4 MG SL tablet PLACE 1 TABLET (0.4 MG TOTAL) UNDER THE TONGUE EVERY 5 (FIVE) MINUTES AS NEEDED FOR CHEST PAIN 75 tablet 2  . potassium chloride SA (KLOR-CON) 20 MEQ tablet Take 1 tablet (20 mEq total) by mouth 2 (two) times daily. 180 tablet 2  . Rivaroxaban (XARELTO) 15 MG TABS tablet Take 1 tablet (15 mg total) by mouth daily with supper. 30 tablet 5  . rosuvastatin (CRESTOR) 40 MG tablet Take 1 tablet (40 mg total) by mouth daily. 90 tablet 3   No  current facility-administered medications for this encounter.    Allergies  Allergen Reactions  . Bee Venom Anaphylaxis  . Other Nausea And Vomiting and Other (See Comments)    Pain medications cause severe vomiting. Tolerated slow IV morphine drip  . Oxycodone Hcl Nausea And Vomiting  . Amiodarone Nausea Only  . Chlorhexidine   . Prednisone Palpitations and Other (See Comments)    "Rapid Heart Beat"    Social History   Socioeconomic History  . Marital status: Widowed    Spouse name: Not on file  . Number of children: 4  . Years of education: Not on file  . Highest education level: Not on file  Occupational History  . Occupation: Retired Nurse, learning disability estate  Tobacco Use  . Smoking status: Former Smoker    Packs/day: 1.00    Years: 32.00    Pack years: 32.00    Types: Cigarettes    Quit date: 02/03/1974    Years since quitting: 45.9  . Smokeless tobacco: Never Used  Vaping Use  . Vaping Use: Never used  Substance and Sexual Activity  . Alcohol use: Yes    Alcohol/week: 7.0 standard drinks    Types: 7 Glasses of wine per week    Comment: one glass wine nightly with dinner  . Drug use: No  . Sexual activity: Never  Other Topics Concern  . Not on file  Social History Narrative   Lives alone.   Social Determinants of Health   Financial Resource Strain:   . Difficulty of Paying Living Expenses: Not on file  Food Insecurity:   . Worried About Charity fundraiser in the Last Year: Not on file  . Ran Out of Food in the Last Year: Not on file  Transportation Needs:   . Lack of Transportation (Medical): Not on file  . Lack of Transportation (Non-Medical): Not on file  Physical Activity:   . Days of Exercise per Week: Not on file  .  Minutes of Exercise per Session: Not on file  Stress:   . Feeling of Stress : Not on file  Social Connections:   . Frequency of Communication with Friends and Family: Not on file  . Frequency of Social Gatherings with Friends and  Family: Not on file  . Attends Religious Services: Not on file  . Active Member of Clubs or Organizations: Not on file  . Attends Archivist Meetings: Not on file  . Marital Status: Not on file  Intimate Partner Violence:   . Fear of Current or Ex-Partner: Not on file  . Emotionally Abused: Not on file  . Physically Abused: Not on file  . Sexually Abused: Not on file    Family History  Problem Relation Age of Onset  . Stroke Mother   . Heart attack Father   . Diabetes Father   . Hypertension Father   . Heart attack Brother   . Diabetes Brother   . Hypertension Brother   . Kidney failure Brother     ROS- All systems are reviewed and negative except as per the HPI above  Physical Exam: Vitals:   01/05/20 1344  Weight: 78.1 kg   Wt Readings from Last 3 Encounters:  01/05/20 78.1 kg  12/02/19 77.3 kg  10/03/19 77.7 kg    Labs: Lab Results  Component Value Date   NA 145 (H) 08/15/2019   K 3.9 08/15/2019   CL 102 08/15/2019   CO2 32 (H) 08/15/2019   GLUCOSE 101 (H) 08/15/2019   BUN 22 08/15/2019   CREATININE 1.10 (H) 08/15/2019   CALCIUM 8.6 (L) 08/15/2019   PHOS 3.7 06/08/2010   MG 2.0 06/22/2019   Lab Results  Component Value Date   INR 3.0 (H) 06/13/2019   Lab Results  Component Value Date   CHOL 112 11/10/2018   HDL 36 (L) 11/10/2018   LDLCALC 62 11/10/2018   TRIG 69 11/10/2018     GEN- The patient is well appearing, alert and oriented x 3 today.   Head- normocephalic, atraumatic Eyes-  Sclera clear, conjunctiva pink Ears- hearing intact Oropharynx- clear Neck- supple, no JVP Lymph- no cervical lymphadenopathy Lungs- Clear to ausculation bilaterally, normal work of breathing Heart- Regular rate and rhythm, no murmurs, rubs or gallops, PMI not laterally displaced GI- soft, NT, ND, + BS Extremities- no clubbing, cyanosis, or edema MS- no significant deformity or atrophy Skin- no rash or lesion Psych- euthymic mood, full affect Neuro-  strength and sensation are intact  EKG-  SR at 60 bpm, pr int qrs int 188 ms, qtc 508 ms     Assessment and Plan: 1. Atach/afib/aflutter  Recent afib burden low In rhythm today    Feels well at this time  Continue dofetilide 125 mcg bid Continue metoprolol xl 200 mg a day  Continue  digoxin 0.125 mcg daily Bmet/mag today   2. CHF Continue daily weights, weight stable at home per pt  Avoid salt   3. CAD Complex disease  No  current anginal problems   4. CHA2DS2VASc score of at least 6 Continue xarelto 15 mg daily      F/u with Dr. Erasmo Leventhal. Allred as scheduled  afib clinic as needed     Butch Penny C. Lasean Gorniak, [facility] [address] [place], [place] [address] [phone]

## 2020-01-06 ENCOUNTER — Other Ambulatory Visit (HOSPITAL_COMMUNITY): Payer: Self-pay | Admitting: *Deleted

## 2020-01-06 MED ORDER — POTASSIUM CHLORIDE CRYS ER 20 MEQ PO TBCR
EXTENDED_RELEASE_TABLET | ORAL | 1 refills | Status: DC
Start: 2020-01-06 — End: 2020-04-20

## 2020-01-07 ENCOUNTER — Other Ambulatory Visit (HOSPITAL_COMMUNITY): Payer: Self-pay | Admitting: Nurse Practitioner

## 2020-01-13 ENCOUNTER — Ambulatory Visit (HOSPITAL_COMMUNITY)
Admission: RE | Admit: 2020-01-13 | Discharge: 2020-01-13 | Disposition: A | Discharge status: Home / Self Care | Payer: Medicare Other | Source: Ambulatory Visit | Attending: Nurse Practitioner | Admitting: Nurse Practitioner

## 2020-01-13 ENCOUNTER — Other Ambulatory Visit: Payer: Self-pay

## 2020-01-13 DIAGNOSIS — I4819 Other persistent atrial fibrillation: Secondary | ICD-10-CM | POA: Diagnosis not present

## 2020-01-13 LAB — BASIC METABOLIC PANEL
Anion gap: 9 (ref 5–15)
BUN: 15 mg/dL (ref 8–23)
CO2: 34 mmol/L — ABNORMAL HIGH (ref 22–32)
Calcium: 8.7 mg/dL — ABNORMAL LOW (ref 8.9–10.3)
Chloride: 100 mmol/L (ref 98–111)
Creatinine, Ser: 0.93 mg/dL (ref 0.44–1.00)
GFR, Estimated: 59 mL/min — ABNORMAL LOW (ref >60)
Glucose, Bld: 109 mg/dL — ABNORMAL HIGH (ref 70–99)
Potassium: 4.1 mmol/L (ref 3.5–5.1)
Sodium: 143 mmol/L (ref 135–145)

## 2020-01-14 ENCOUNTER — Other Ambulatory Visit (HOSPITAL_COMMUNITY): Payer: Self-pay | Admitting: Nurse Practitioner

## 2020-02-07 ENCOUNTER — Ambulatory Visit (INDEPENDENT_AMBULATORY_CARE_PROVIDER_SITE_OTHER): Payer: Medicare Other

## 2020-02-07 DIAGNOSIS — I5042 Chronic combined systolic (congestive) and diastolic (congestive) heart failure: Secondary | ICD-10-CM | POA: Diagnosis not present

## 2020-02-07 LAB — CUP PACEART REMOTE DEVICE CHECK
Battery Remaining Longevity: 124 mo
Battery Remaining Percentage: 95.5 %
Battery Voltage: 3.01 V
Brady Statistic AP VP Percent: 5 %
Brady Statistic AP VS Percent: 6.1 %
Brady Statistic AS VP Percent: 51 %
Brady Statistic AS VS Percent: 37 %
Brady Statistic RA Percent Paced: 12 %
Brady Statistic RV Percent Paced: 56 %
Date Time Interrogation Session: 20220104090027
Implantable Lead Implant Date: 20181224
Implantable Lead Implant Date: 20181224
Implantable Lead Location: 753859
Implantable Lead Location: 753860
Implantable Pulse Generator Implant Date: 20181224
Lead Channel Impedance Value: 1025 Ohm
Lead Channel Impedance Value: 490 Ohm
Lead Channel Pacing Threshold Amplitude: 0.75 V
Lead Channel Pacing Threshold Amplitude: 0.75 V
Lead Channel Pacing Threshold Pulse Width: 0.4 ms
Lead Channel Pacing Threshold Pulse Width: 0.4 ms
Lead Channel Sensing Intrinsic Amplitude: 5 mV
Lead Channel Sensing Intrinsic Amplitude: 8.8 mV
Lead Channel Setting Pacing Amplitude: 2.5 V
Lead Channel Setting Pacing Amplitude: 2.5 V
Lead Channel Setting Pacing Pulse Width: 0.4 ms
Lead Channel Setting Sensing Sensitivity: 2 mV
Pulse Gen Model: 2272
Pulse Gen Serial Number: 8968200

## 2020-02-15 ENCOUNTER — Telehealth: Payer: Self-pay | Admitting: Interventional Cardiology

## 2020-02-15 NOTE — Telephone Encounter (Signed)
Pt c/o BP issue: STAT if pt c/o blurred vision, one-sided weakness or slurred speech  1. What are your last 5 BP readings? 178/72 No additional readings available.  2. Are you having any other symptoms (ex. Dizziness, headache, blurred vision, passed out)? SOB  3. What is your BP issue? Almyra Free with [facility] is calling to follow up regarding the patient's recent elevated BP. She states the patient's systolic has been in the 141'C-301'T. She states the patient has also had SOB in the morning (see previous note).

## 2020-02-15 NOTE — Telephone Encounter (Addendum)
Patient states that she has been experiencing worsening SOB over the past few days when she wakes in the mornings. This has caused her difficulty walking. She states throughout the day it seems to get better.   Patients HR this AM was 60 bpm  Patients BP after meds this afternoon was 178/72  Patient denies any weight gain or swelling in abdomen or lower extremeties. Patient denies any CP, N/V, headache or diaphoresis.   Patient states she has been very anxious lately regarding rising COVID number and was recently tested which came back negative. She states she is now feeling much better than she did this morning and is less concerned about her SOB. Advised patient to continue monitoring HR/BP and call back in AM with new readings. She is aware of symptoms that warrant a call to our office versus EMS.   Patient being seen by palliative home health.  Sending to Dr. Irish Lack for advisement.

## 2020-02-15 NOTE — Telephone Encounter (Signed)
Pt c/o Shortness Of Breath: STAT if SOB developed within the last 24 hours or pt is noticeably SOB on the phone  1. Are you currently SOB (can you hear that pt is SOB on the phone)? No, cannot hear otp   2. How long have you been experiencing SOB? Past week or so has worsened   3. Are you SOB when sitting or when up moving around? Occurring in the morning when she wakes up   4. Are you currently experiencing any other symptoms? Congestion in head last week, Sunday and Monday and some of yesterday "drippy nose", but no "drippy nose" today  Patty [name] is calling requesting to speak with Dr. Hassell Done nurse about her recent worsened SOB. She states she had a rapid Covid test and it came back negative. Please advise.

## 2020-02-16 NOTE — Telephone Encounter (Signed)
OK to add amlodipine 5 mg daily to help with BP.

## 2020-02-16 NOTE — Telephone Encounter (Signed)
Attempted to call the pt but no answer on her home or mobile numbers. Will try again alter this afternoon.

## 2020-02-17 ENCOUNTER — Other Ambulatory Visit (HOSPITAL_COMMUNITY): Payer: Self-pay | Admitting: Nurse Practitioner

## 2020-02-17 MED ORDER — AMLODIPINE BESYLATE 5 MG PO TABS: 5.0000 mg | ORAL_TABLET | Freq: Every day | ORAL | 3 refills | Status: DC

## 2020-02-17 NOTE — Telephone Encounter (Signed)
[name], MD        OK to add amlodipine 5 mg daily to help with BP.      Patient made aware of the above. Rx sent to [facility]. Patient aware to continue monitoring her BP closely and call us back with any new concerns.

## 2020-02-21 NOTE — Progress Notes (Signed)
Remote pacemaker transmission.   

## 2020-02-24 ENCOUNTER — Telehealth: Payer: Self-pay | Admitting: Interventional Cardiology

## 2020-02-24 ENCOUNTER — Other Ambulatory Visit: Payer: Self-pay | Admitting: Interventional Cardiology

## 2020-02-24 NOTE — Telephone Encounter (Signed)
° ° ° °  Pt would like to speak with Dr. Irish Lack, she said she wanted to discuss the result/effect of her new medications

## 2020-02-24 NOTE — Telephone Encounter (Signed)
I placed call to patient but there was no answer.  Will try again later

## 2020-02-24 NOTE — Telephone Encounter (Signed)
I spoke with patient who reports amlodipine has been helping her BP. She started it on 1/15 and reports the following readings-  All AM readings are before medication and PM readings are in the afternoon- 1/19- AM 147 systolic, PM- 829/56 2/13-YQ-657/84, PM-151/61 1/17- AM-166/60, PM- no reading available 1/18- AM- 152/71, PM-141/73 1/19-AM-169/62, PM- 150/75 1/20- AM-165/70, PM- 141/77 1/21- AM-157/74  I told patient I would forward to Dr Irish Lack and we would call her back if any changes recommended

## 2020-02-25 NOTE — Telephone Encounter (Signed)
I would increase amlodipine to 5 mg PO BID.  JV

## 2020-02-27 MED ORDER — AMLODIPINE BESYLATE 5 MG PO TABS: 5.0000 mg | ORAL_TABLET | Freq: Two times a day (BID) | ORAL | 3 refills | Status: DC

## 2020-02-27 NOTE — Telephone Encounter (Signed)
Patient returning Susan's call, connected call to Manuela Schwartz

## 2020-02-27 NOTE — Telephone Encounter (Signed)
Rn spoke with the patient and patient stated she understood the medication change, and would monitor her b/p and would call with any questions or concerns.

## 2020-02-27 NOTE — Telephone Encounter (Signed)
Rn attempted to call the patient, there was no answer, will try again later today.

## 2020-03-20 ENCOUNTER — Ambulatory Visit (INDEPENDENT_AMBULATORY_CARE_PROVIDER_SITE_OTHER): Payer: Medicare Other | Admitting: Podiatry

## 2020-03-20 ENCOUNTER — Encounter: Payer: Self-pay | Admitting: Podiatry

## 2020-03-20 ENCOUNTER — Other Ambulatory Visit: Payer: Self-pay

## 2020-03-20 DIAGNOSIS — D689 Coagulation defect, unspecified: Secondary | ICD-10-CM

## 2020-03-20 DIAGNOSIS — M79676 Pain in unspecified toe(s): Secondary | ICD-10-CM

## 2020-03-20 DIAGNOSIS — B351 Tinea unguium: Secondary | ICD-10-CM

## 2020-03-20 DIAGNOSIS — G620 Drug-induced polyneuropathy: Secondary | ICD-10-CM

## 2020-03-20 DIAGNOSIS — T451X5A Adverse effect of antineoplastic and immunosuppressive drugs, initial encounter: Secondary | ICD-10-CM

## 2020-03-20 NOTE — Progress Notes (Signed)
This patient returns to my office for at risk foot care.  This patient requires this care by a professional since this patient will be at risk due to having peripheral neuropathy, and coagulation defect.  Patient is taking plavix and xarelto.  This patient is unable to cut nails herself since the patient cannot reach her nails.These nails are painful walking and wearing shoes.  This patient presents for at risk foot care today.  General Appearance  Alert, conversant and in no acute stress.  Vascular  Dorsalis pedis and posterior tibial  pulses are palpable  bilaterally.  Capillary return is within normal limits  bilaterally. Temperature is within normal limits  bilaterally.  Neurologic  Senn-Weinstein monofilament wire test within normal limits  bilaterally. Muscle power within normal limits bilaterally.  Nails Thick disfigured discolored nails with subungual debris  from hallux to fifth toes bilaterally. No evidence of bacterial infection or drainage bilaterally.  Orthopedic  No limitations of motion  feet .  No crepitus or effusions noted.  No bony pathology or digital deformities noted.  HAV  B/L.  Skin  normotropic skin with no porokeratosis noted bilaterally.  No signs of infections or ulcers noted.  Pinch callus left hallux.   Onychomycosis  Pain in right toes  Pain in left toes  Consent was obtained for treatment procedures.   Mechanical debridement of nails 1-5  bilaterally performed with a nail nipper.  Filed with dremel without incident.    Return office visit   10 weeks                  Told patient to return for periodic foot care and evaluation due to potential at risk complications.   Gardiner Barefoot DPM

## 2020-03-29 ENCOUNTER — Other Ambulatory Visit: Payer: Self-pay | Admitting: Interventional Cardiology

## 2020-03-30 ENCOUNTER — Telehealth: Payer: Self-pay | Admitting: Interventional Cardiology

## 2020-03-30 NOTE — Telephone Encounter (Signed)
Called pt back, but no answer, was unable to leave a voice mail, to inform pt that Dr. Irish Lack increased medication Amlodipine 5 mg tablets to BID, on 02/24/20, per phone note.

## 2020-03-30 NOTE — Telephone Encounter (Signed)
Patient wanted to confirm that she should be taking Amlodipine 5 mg BID. She has been using an old bottle with the label stating 5 mg once daily. Confirmed that the medication change was made on 02/27/20. Patient found her new bottle of Amlodipine with the correct label stating 5 mg BID. Patient aware of dosage and will continue taking 5 mg BID and call with any new questions or concerns.

## 2020-03-30 NOTE — Telephone Encounter (Signed)
    Pt c/o medication issue:  1. Name of Medication:   amLODipine (NORVASC) 5 MG tablet    2. How are you currently taking this medication (dosage and times per day)? Take 1 tablet (5 mg total) by mouth 2 (two) times daily.  3. Are you having a reaction (difficulty breathing--STAT)?   4. What is your medication issue? Pt said she have questions about this medications, she said the quantity of the meds is not matching the dosage of this medications, she said if she unable to answer phone to leave her a detailed message

## 2020-04-05 ENCOUNTER — Telehealth: Payer: Self-pay | Admitting: Interventional Cardiology

## 2020-04-05 NOTE — Telephone Encounter (Signed)
   Pt c/o medication issue:  1. Name of Medication: all of them  2. How are you currently taking this medication (dosage and times per day)? As directed   3. Are you having a reaction (difficulty breathing--STAT)? Diarrhea   4. What is your medication issue? Patient wanted to talk to Dr. Irish Lack about all of her medications. She has frequent diarrhea and feels one or more of her heart medications may be causing the symptoms. Please collow up

## 2020-04-05 NOTE — Telephone Encounter (Signed)
I placed call to patient but there was no answer

## 2020-04-06 ENCOUNTER — Telehealth: Payer: Self-pay | Admitting: *Deleted

## 2020-04-06 NOTE — Telephone Encounter (Signed)
Avoid aspirin, NSAIDs.  Continue to have CBC monitored.  Can order f/u CBC in 1 month if not scheduled to have another with PCP.  Pepto can cause dark stool as well.

## 2020-04-06 NOTE — Telephone Encounter (Signed)
Agree with recs from Dr Irish Lack. No additional thoughts from when she asked similar question in November. Magnesium is most likely culprit of diarrhea but she takes Tikosyn which requires Mg levels to stay at least 2. She would need to follow up with the afib clinic to adjust the dose/monitor this.

## 2020-04-06 NOTE — Telephone Encounter (Signed)
Left message to call office on Monday

## 2020-04-06 NOTE — Telephone Encounter (Signed)
Client called this CN requesting that I place a call to her son.  Her son has HTN and she wants son to be seen and started with a PCP so that HTN can be managed.  This CN will call client's son to discuss follow up.  Karene Fry, RN, MSN, [facility] [phone] Cell

## 2020-04-06 NOTE — Telephone Encounter (Signed)
I spoke with patient. She reports for the last 4 months or so she has diarrhea every morning around 11 AM.  Prior to this she takes her medicines and has breakfast which includes a cup of half caffeine coffee. She is asking if any of her heart medicines could be causing diarrhea.  I told her medicines were reviewed in November by pharmacist for diarrhea concerns.  Atorvastatin was changed to Rosuvastatin at this time.  Patient reports no improvement in diarrhea after this change.  Patient reports PCP did hemoccult test recently and this was positive. CBC was then checked and patient states results showed hemoglobin was OK.  Patient states stools are dark at times.  Not tarry looking. She has also been taking Pepto Bismal daily for the last 2 months to coat her stomach and this has been helping.  Has never seen a GI doctor. Will forward to Dr Irish Lack for review/recommendations.

## 2020-04-10 NOTE — Telephone Encounter (Signed)
Left message to call office

## 2020-04-11 NOTE — Telephone Encounter (Signed)
Returned pt's call to let her know that pharmacist had this message-she "agrees with recs from Dr Irish Lack. No additional thoughts from when she asked similar question in November. Magnesium is most likely culprit of diarrhea but she takes Tikosyn which requires Mg levels to stay at least 2. She would need to follow up with the afib clinic to adjust the dose/monitor this". Pt is aware and verbalized understanding. She stated she will call Butch Penny at [facility] clinic today and she also has scheduled an upcoming GI appt, as she feels this is something related to her cancer or a GI issue. She will call us back if she needs further assistance.

## 2020-04-11 NOTE — Telephone Encounter (Signed)
Pt is returning call.  

## 2020-04-16 ENCOUNTER — Other Ambulatory Visit: Payer: Self-pay | Admitting: Gastroenterology

## 2020-04-16 DIAGNOSIS — R197 Diarrhea, unspecified: Secondary | ICD-10-CM

## 2020-04-16 DIAGNOSIS — R195 Other fecal abnormalities: Secondary | ICD-10-CM

## 2020-04-19 ENCOUNTER — Other Ambulatory Visit: Payer: Self-pay

## 2020-04-19 ENCOUNTER — Ambulatory Visit (HOSPITAL_COMMUNITY)
Admission: RE | Admit: 2020-04-19 | Discharge: 2020-04-19 | Disposition: A | Discharge status: Home / Self Care | Payer: Medicare Other | Source: Ambulatory Visit | Attending: Physician Assistant | Admitting: Physician Assistant

## 2020-04-19 DIAGNOSIS — I4819 Other persistent atrial fibrillation: Secondary | ICD-10-CM | POA: Diagnosis not present

## 2020-04-19 LAB — BASIC METABOLIC PANEL
Anion gap: 5 (ref 5–15)
BUN: 11 mg/dL (ref 8–23)
CO2: 33 mmol/L — ABNORMAL HIGH (ref 22–32)
Calcium: 8.6 mg/dL — ABNORMAL LOW (ref 8.9–10.3)
Chloride: 99 mmol/L (ref 98–111)
Creatinine, Ser: 0.87 mg/dL (ref 0.44–1.00)
GFR, Estimated: >60 mL/min (ref >60)
Glucose, Bld: 98 mg/dL (ref 70–99)
Potassium: 3.5 mmol/L (ref 3.5–5.1)
Sodium: 137 mmol/L (ref 135–145)

## 2020-04-19 LAB — MAGNESIUM: Magnesium: 1.9 mg/dL (ref 1.7–2.4)

## 2020-04-20 ENCOUNTER — Other Ambulatory Visit (HOSPITAL_COMMUNITY): Payer: Self-pay | Admitting: *Deleted

## 2020-04-20 MED ORDER — POTASSIUM CHLORIDE CRYS ER 20 MEQ PO TBCR
40.0000 meq | EXTENDED_RELEASE_TABLET | Freq: Two times a day (BID) | ORAL | 1 refills | Status: DC
Start: 2020-04-20 — End: 2020-05-17

## 2020-04-20 MED ORDER — MAGNESIUM OXIDE 400 MG PO TABS
400.0000 mg | ORAL_TABLET | Freq: Every day | ORAL | 6 refills | Status: DC
Start: 2020-04-20 — End: 2020-07-09

## 2020-05-01 ENCOUNTER — Other Ambulatory Visit: Payer: Medicare Other

## 2020-05-03 ENCOUNTER — Other Ambulatory Visit (HOSPITAL_COMMUNITY): Payer: Medicare Other | Admitting: Physician Assistant

## 2020-05-03 ENCOUNTER — Inpatient Hospital Stay: Admission: RE | Admit: 2020-05-03 | Payer: Medicare Other | Source: Ambulatory Visit

## 2020-05-04 ENCOUNTER — Other Ambulatory Visit: Payer: Self-pay

## 2020-05-04 ENCOUNTER — Ambulatory Visit (HOSPITAL_COMMUNITY)
Admission: RE | Admit: 2020-05-04 | Discharge: 2020-05-04 | Disposition: A | Discharge status: Home / Self Care | Payer: Medicare Other | Source: Ambulatory Visit | Attending: Physician Assistant | Admitting: Physician Assistant

## 2020-05-04 DIAGNOSIS — I4819 Other persistent atrial fibrillation: Secondary | ICD-10-CM | POA: Diagnosis not present

## 2020-05-04 LAB — BASIC METABOLIC PANEL
Anion gap: 9 (ref 5–15)
BUN: 11 mg/dL (ref 8–23)
CO2: 27 mmol/L (ref 22–32)
Calcium: 8.6 mg/dL — ABNORMAL LOW (ref 8.9–10.3)
Chloride: 101 mmol/L (ref 98–111)
Creatinine, Ser: 0.98 mg/dL (ref 0.44–1.00)
GFR, Estimated: 56 mL/min — ABNORMAL LOW (ref >60)
Glucose, Bld: 158 mg/dL — ABNORMAL HIGH (ref 70–99)
Potassium: 4 mmol/L (ref 3.5–5.1)
Sodium: 137 mmol/L (ref 135–145)

## 2020-05-08 ENCOUNTER — Ambulatory Visit (INDEPENDENT_AMBULATORY_CARE_PROVIDER_SITE_OTHER): Payer: Medicare Other

## 2020-05-08 ENCOUNTER — Other Ambulatory Visit: Payer: Self-pay | Admitting: Interventional Cardiology

## 2020-05-08 ENCOUNTER — Telehealth: Payer: Self-pay | Admitting: Interventional Cardiology

## 2020-05-08 DIAGNOSIS — I495 Sick sinus syndrome: Secondary | ICD-10-CM

## 2020-05-08 LAB — CUP PACEART REMOTE DEVICE CHECK
Battery Remaining Longevity: 121 mo
Battery Remaining Percentage: 95.5 %
Battery Voltage: 2.99 V
Brady Statistic AP VP Percent: 13 %
Brady Statistic AP VS Percent: 4.6 %
Brady Statistic AS VP Percent: 55 %
Brady Statistic AS VS Percent: 28 %
Brady Statistic RA Percent Paced: 18 %
Brady Statistic RV Percent Paced: 68 %
Date Time Interrogation Session: 20220405023845
Implantable Lead Implant Date: 20181224
Implantable Lead Implant Date: 20181224
Implantable Lead Location: 753859
Implantable Lead Location: 753860
Implantable Pulse Generator Implant Date: 20181224
Lead Channel Impedance Value: 490 Ohm
Lead Channel Impedance Value: 950 Ohm
Lead Channel Pacing Threshold Amplitude: 0.75 V
Lead Channel Pacing Threshold Amplitude: 0.75 V
Lead Channel Pacing Threshold Pulse Width: 0.4 ms
Lead Channel Pacing Threshold Pulse Width: 0.4 ms
Lead Channel Sensing Intrinsic Amplitude: 11 mV
Lead Channel Sensing Intrinsic Amplitude: 4.9 mV
Lead Channel Setting Pacing Amplitude: 2.5 V
Lead Channel Setting Pacing Amplitude: 2.5 V
Lead Channel Setting Pacing Pulse Width: 0.4 ms
Lead Channel Setting Sensing Sensitivity: 2 mV
Pulse Gen Model: 2272
Pulse Gen Serial Number: 8968200

## 2020-05-08 MED ORDER — DIGOXIN 125 MCG PO TABS
0.0625 mg | ORAL_TABLET | Freq: Every day | ORAL | 2 refills | Status: DC
Start: 2020-05-08 — End: 2020-05-08

## 2020-05-08 MED ORDER — DIGOXIN 125 MCG PO TABS
0.0625 mg | ORAL_TABLET | Freq: Every day | ORAL | 2 refills | Status: DC
Start: 2020-05-08 — End: 2020-07-11

## 2020-05-08 NOTE — Telephone Encounter (Signed)
*  STAT* If patient is at the pharmacy, call can be transferred to refill team.   1. Which medications need to be refilled? (please list name of each medication and dose if known) digoxin (LANOXIN) 0.125 MG tablet  2. Which pharmacy/location (including street and city if local pharmacy) is medication to be sent to? [place], [place].  3. Do they need a 30 day or 90 day supply? 30 day supply  Patient states she is completely out of medication.

## 2020-05-08 NOTE — Telephone Encounter (Signed)
Returned call to pt and she has been made aware that we have sent in her refill for Digoxin to [place], [place], per her request.  Pt also reminded of her upcoming appt with Dr. Irish Lack 06/05/2020.  Pt was appreciative of the call back.

## 2020-05-08 NOTE — Telephone Encounter (Signed)
Patient requested a confirmation call. Please call her cell phone, [phone], she states the battery is low on her home phone.

## 2020-05-16 ENCOUNTER — Ambulatory Visit
Admission: RE | Admit: 2020-05-16 | Discharge: 2020-05-16 | Disposition: A | Discharge status: Home / Self Care | Payer: Medicare Other | Source: Ambulatory Visit | Attending: Gastroenterology | Admitting: Gastroenterology

## 2020-05-16 DIAGNOSIS — R195 Other fecal abnormalities: Secondary | ICD-10-CM

## 2020-05-16 DIAGNOSIS — R197 Diarrhea, unspecified: Secondary | ICD-10-CM

## 2020-05-16 MED ORDER — IOPAMIDOL (ISOVUE-300) INJECTION 61%
100.0000 mL | Freq: Once | INTRAVENOUS | Status: AC | PRN
  Administered 2020-05-16: 100 mL via INTRAVENOUS

## 2020-05-17 ENCOUNTER — Telehealth: Payer: Self-pay | Admitting: Interventional Cardiology

## 2020-05-17 MED ORDER — POTASSIUM CHLORIDE CRYS ER 20 MEQ PO TBCR: 40.0000 meq | EXTENDED_RELEASE_TABLET | Freq: Two times a day (BID) | ORAL | 8 refills | Status: AC

## 2020-05-17 NOTE — Telephone Encounter (Signed)
*  STAT* If patient is at the pharmacy, call can be transferred to refill team.   1. Which medications need to be refilled? (please list name of each medication and dose if known)  Need a new prescriiption for Klor Con  20 mg 4 times a day  2. Which pharmacy/location (including street and city if local pharmacy) is medication to be sent to?  3. Do they need a 30 day or 90 day supply? #120 mg and refills

## 2020-05-17 NOTE — Telephone Encounter (Signed)
Pt's medication was sent to pt's pharmacy as requested. Confirmation received.  °

## 2020-05-18 NOTE — Progress Notes (Signed)
Remote pacemaker transmission.   

## 2020-05-29 ENCOUNTER — Encounter: Payer: Self-pay | Admitting: Podiatry

## 2020-05-29 ENCOUNTER — Ambulatory Visit: Payer: Medicare Other | Admitting: Podiatry

## 2020-05-29 ENCOUNTER — Other Ambulatory Visit: Payer: Self-pay

## 2020-05-29 DIAGNOSIS — M79676 Pain in unspecified toe(s): Secondary | ICD-10-CM | POA: Diagnosis not present

## 2020-05-29 DIAGNOSIS — G620 Drug-induced polyneuropathy: Secondary | ICD-10-CM

## 2020-05-29 DIAGNOSIS — B351 Tinea unguium: Secondary | ICD-10-CM | POA: Diagnosis not present

## 2020-05-29 DIAGNOSIS — D689 Coagulation defect, unspecified: Secondary | ICD-10-CM | POA: Diagnosis not present

## 2020-05-29 NOTE — Progress Notes (Signed)
This patient returns to my office for at risk foot care.  This patient requires this care by a professional since this patient will be at risk due to having peripheral neuropathy, and coagulation defect.  Patient is taking plavix and xarelto.  This patient is unable to cut nails herself since the patient cannot reach her nails.These nails are painful walking and wearing shoes.  This patient presents for at risk foot care today.  General Appearance  Alert, conversant and in no acute stress.  Vascular  Dorsalis pedis and posterior tibial  pulses are palpable  bilaterally.  Capillary return is within normal limits  bilaterally. Temperature is within normal limits  bilaterally.  Neurologic  Senn-Weinstein monofilament wire test within normal limits  bilaterally. Muscle power within normal limits bilaterally.  Nails Thick disfigured discolored nails with subungual debris  from hallux to fifth toes bilaterally. No evidence of bacterial infection or drainage bilaterally.  Orthopedic  No limitations of motion  feet .  No crepitus or effusions noted.  No bony pathology or digital deformities noted.  HAV  B/L.  Skin  normotropic skin with no porokeratosis noted bilaterally.  No signs of infections or ulcers noted.  Pinch callus left hallux.   Onychomycosis  Pain in right toes  Pain in left toes  Consent was obtained for treatment procedures.   Mechanical debridement of nails 1-5  bilaterally performed with a nail nipper.  Filed with dremel without incident.    Return office visit   10 weeks                  Told patient to return for periodic foot care and evaluation due to potential at risk complications.   Bobetta Korf DPM  

## 2020-05-30 ENCOUNTER — Other Ambulatory Visit: Payer: Self-pay | Admitting: Interventional Cardiology

## 2020-05-30 NOTE — Telephone Encounter (Signed)
Xarelto 15mg  refill request received. Pt is 87 years old, weight-78.1kg, Crea-0.98 on 05/04/2020, last seen by Roderic Palau on 01/05/2020, Diagnosis-Afib, CrCl-49.6ml/min; Dose is appropriate based on dosing criteria. Will send in refill to requested pharmacy.

## 2020-06-04 NOTE — Progress Notes (Signed)
Cardiology Office Note   Date:  06/05/2020   ID:  Patty [name], DOB 16-Nov-1930, MRN [id number]  PCP:  [name], MD    No chief complaint on file.  CAD  Wt Readings from Last 3 Encounters:  06/05/20 169 lb (76.7 kg)  01/05/20 172 lb 3.2 oz (78.1 kg)  12/02/19 170 lb 6.4 oz (77.3 kg)       History of Present Illness: Patty [name] is a 87 y.o. female  with history of CAD s/p multiple stents, chronic combined CHF, permanent atrial fib/flutter on Coumadin with tachy-brady syndrome s/p leadless PPM (with premature battery failure no longer active - being followed conservatively for now), probable CKD III per labs, granulosa cell carcinoma, HTN, HLD, LBBB, varicose veins, renal artery stenosis, PVD, diverticulitis, OSA who presents for f/u.  She has significant CAD with stenting x 2 to the LAD and x 1 to the LCx in 2000, rotational arthrectomy to proximal LCx and DES to LAD in 2014 and DES to LCx in 2014, along with DES to LAD for -in-stent stenosis in 2015, and DES to ostial LAD in 2016. PVD is listed in chart but I cannot find further notes regarding this as LE doppler 2014 showed no evidence of significant LE disease   She was admittedin June 2018with exertional dyspnea and chest tightness and palpitations. She was found to be in rapid atrial fib/flutter and minimally elevated troponin. 2D Echo 07/11/16 showed moderate focal basal and mild concentric hypertrophy, EF 50-55%, no RWMA, septal dyssyngery c/w LBBB, mild AI, massively dilated LA, trivial TR, mild PR. Coumadin was held in prep for cath which was performed 07/14/16 showing 100% CTO of OM1 with L-L collaterals, 90% distal Cx, 100% distal PLOM, otherwise nonobstructive disease, normal LVEDP - unsuccessful PCI of the distal Cx due to inability to cross the lesion with a balloon. She was brought back 07/15/16 and underwent successful orbital atherectomy &DES to mid LCx.   She had an episode of Shingles around Memorial  Day 2019. It was on the right side of her chest. She was treated with acyclovir. She still has some itching. She had constipation and nausea. She treated this with peptobismol. She initially thought it was a heart episode, but then with the rash, realized it was shingles. She still has some trouble sleeping.   She had a lot of fatigue with the shingles. SHe had a lot of stress with her son who is an alcoholic.  He had intrabdominal bleeding in 10/19 from tumors. Anticoagulation was stopped for a while and restarted when approval was given by her oncology team.   She has been treated with chemo and has responded well. No bleeding, even after Coumadin was started.  She has had episodes of volume overload managed with diureticsand AFib RVR in early 2021.   She was seen in the ER in 5/21. Records from Dr. Martinique show: "I spend 45 minutes in direct patient care discussing her underlying cardiac condition, treatment to date and prognosis.  From extensive evaluation to date we really have nothing else to offer her for maintenance of NSR. We may be able to control her HR better and will add digoxin 0.0625 mg daily. Continue Toprol XL. I don't feel that repeating DCCV at this time will offer any benefit. We discussed extensively managing her symptoms at home with medication. With palliative care on board she may benefit from a short acting narcotic for pain or anxiolytic for her stress/anxiety. She is  not currently volume overloaded and from her previous coronary assessment there is really nothing fixable.   I did offer her the option of readmission to the hospital and another EP consultation but she is comfortable with the idea of going home. I really think a comprehensive palliative care approach is appropriate for her. She understands that she has a number of issues for which there is no cure and that our goals of care are more to maintain her independence and quality of life as much as  possible.  Will plan on DC from the ED today and arrange follow up with Dr Irish Lack."   HR better since Digoxin was started. BP has been stable. Started on Mg for low Mg level.  Received both COVID vaccines.  Earlier in 2021, she was seen in hospital by Dr. Martinique: "She very much appreciated the goals of care discussion with Dr. Martinique. She still has [facility] for now. She was signed up for palliative care. Her anxiety is significantly less.. "  Her cancer treatment is on hold since the chemo risks may outweigh the benefits in regards to her heart issues.  She had COVID in 9/21 despite 2 Moderna shots in 2/21, and received the antibody infusion.   Palliative care nurse coming to her house several times a month.    BP readings at home are in the 096G systolic typically. Increased ankle swelling of late. She does elevate her legs.   Denies : Chest pain. Dizziness.  Nitroglycerin use. Orthopnea. Palpitations. Paroxysmal nocturnal dyspnea.  Syncope.   She feels that she does not have much energy.  SHe is not sleeping well when she takes the furosemide late in the afternoon.  She adjusts her afternoon dose of Lasix based on if she feels that she has extra fluid on board.  She has had some diarrhea of late.    THought it was related to her cancer reading. Turned out it was related to excess Mg intake.   Past Medical History:  Diagnosis Date  . Bleeding behind the abdominal cavity 10/2017  . Chronic anticoagulation - coumadin, CHADS2VASC=6 05/17/2015  . CKD (chronic kidney disease), stage III ([place])   . Combined systolic and diastolic heart failure ([place])   . Coronary artery disease    a. s/p multiple stents - stenting x 2 to the LAD and x 1 to the LCx in 2000, rotational arthrectomy to proximal LCx and DES to LAD in 2014 and DES to LCx in 2014, along with DES to LAD for re-in-stent stenosis in 2015, and DES to ostial LAD in 2016. b. 07/2016 - orbital atherectomy & DES  to mid LCx.  . Diverticulitis   . Granulosa cell carcinoma ([place])    abd; last episode was in 2009  . Hyperlipidemia   . Hypertension   . LBBB (left bundle branch block)   . Myocardial infarction ([place]) 2002  . Obesity   . OSA on CPAP   . Ovarian ca ([place]) 2019  . Pacemaker failure    a. Prior leadless PPM with premature battery failure, being managed conservatively without replacement.  . Peripheral vascular disease ([place])   . Permanent atrial fibrillation ([place]) 2013  . Renal artery stenosis ([place])   . Tachycardia-bradycardia syndrome ([place])    a. s/p leadless pacemaker ([name]) implanted by Dr [name]  . Varicose veins     Past Surgical History:  Procedure Laterality Date  . ABDOMINAL HYSTERECTOMY    . CARDIAC CATHETERIZATION  09/03/2007  EF 70%; Failed attempt at PCI to OM  . CARDIAC CATHETERIZATION  11/01/2003   EF 70%  . CARDIAC CATHETERIZATION N/A 12/14/2015   Procedure: Left Heart Cath and Coronary Angiography;  Surgeon: Burnell Blanks, MD;  Location: [place] [facility] LAB;  Service: Cardiovascular;  Laterality: N/A;  . CARDIOVERSION  12/31/2011   Procedure: CARDIOVERSION;  Surgeon: [name], MD;  Location: [facility] ENDOSCOPY;  Service: Cardiovascular;  Laterality: N/A;  . CARDIOVERSION N/A 12/31/2011   Procedure: CARDIOVERSION;  Surgeon: [name], MD;  Location: [facility] CATH LAB;  Service: Cardiovascular;  Laterality: N/A;  . CARDIOVERSION N/A 11/15/2018   Procedure: CARDIOVERSION;  Surgeon: Dorothy Spark, MD;  Location: [facility] ENDOSCOPY;  Service: Cardiovascular;  Laterality: N/A;  . CARDIOVERSION N/A 06/10/2019   Procedure: CARDIOVERSION;  Surgeon: Fay Records, MD;  Location: [facility] ENDOSCOPY;  Service: Cardiovascular;  Laterality: N/A;  . CARDIOVERSION N/A 06/14/2019   Procedure: CARDIOVERSION;  Surgeon: Pixie Casino, MD;  Location: [place];  Service: Cardiovascular;  Laterality: N/A;  . CATARACT EXTRACTION, BILATERAL  2015  . CHOLECYSTECTOMY   1980's  . COLON SURGERY  2004   colectomy for diverticulosis  . CORONARY ANGIOPLASTY WITH STENT PLACEMENT  2000    and 08/11/2012; 11/12/2012: 3 + 2 LAD & CFX; 2nd CFX stent 11/12/2012  . CORONARY ATHERECTOMY N/A 07/15/2016   Procedure: Coronary Atherectomy;  Surgeon: Martinique, Peter M, MD;  Location: [place] [facility];  Service: Cardiovascular;  Laterality: N/A;  . CORONARY BALLOON ANGIOPLASTY N/A 07/14/2016   Procedure: Coronary Balloon Angioplasty;  Surgeon: Martinique, Peter M, MD;  Location: [place] [facility];  Service: Cardiovascular;  Laterality: N/A;  . CORONARY STENT INTERVENTION N/A 07/15/2016   Procedure: Coronary Stent Intervention;  Surgeon: Martinique, Peter M, MD;  Location: [place] [facility] LAB;  Service: Cardiovascular;  Laterality: N/A;  . FRACTIONAL FLOW RESERVE WIRE  10/07/2013   Procedure: [place];  Surgeon: [name], MD;  Location: [facility] CATH LAB;  Service: Cardiovascular;;  . HERNIA REPAIR  2005   "laparoscopic"  . IR IMAGING GUIDED PORT INSERTION  10/28/2017  . LEFT HEART CATH AND CORONARY ANGIOGRAPHY N/A 07/14/2016   Procedure: Left Heart Cath and Coronary Angiography;  Surgeon: Martinique, Peter M, MD;  Location: [facility] CV LAB;  Service: Cardiovascular;  Laterality: N/A;  . LEFT HEART CATH AND CORONARY ANGIOGRAPHY N/A 11/10/2018   Procedure: LEFT HEART CATH AND CORONARY ANGIOGRAPHY;  Surgeon: Wellington Hampshire, MD;  Location: [place] [facility];  Service: Cardiovascular;  Laterality: N/A;  . LEFT HEART CATHETERIZATION WITH CORONARY ANGIOGRAM N/A 11/12/2012   Procedure: LEFT HEART CATHETERIZATION WITH CORONARY ANGIOGRAM;  Surgeon: [name], MD;  Location: [facility] CATH LAB;  Service: Cardiovascular;  Laterality: N/A;  . LEFT HEART CATHETERIZATION WITH CORONARY ANGIOGRAM N/A 10/07/2013   Procedure: LEFT HEART CATHETERIZATION WITH CORONARY ANGIOGRAM;  Surgeon: [name], MD;  Location: [facility] CATH LAB;  Service: Cardiovascular;  Laterality: N/A;  .  LEFT HEART CATHETERIZATION WITH CORONARY ANGIOGRAM N/A 12/14/2013   Procedure: LEFT HEART CATHETERIZATION WITH CORONARY ANGIOGRAM;  Surgeon: Sinclair Grooms, MD;  Location: [facility] CATH LAB;  Service: Cardiovascular;  Laterality: N/A;  . LEFT HEART CATHETERIZATION WITH CORONARY ANGIOGRAM N/A 05/16/2014   Procedure: LEFT HEART CATHETERIZATION WITH CORONARY ANGIOGRAM;  Surgeon: Sherren Mocha, MD;  Location: [facility] CATH LAB;  Service: Cardiovascular;  Laterality: N/A;  . PERCUTANEOUS CORONARY INTERVENTION-BALLOON ONLY  08/04/2012   Procedure: PERCUTANEOUS CORONARY INTERVENTION-BALLOON ONLY;  Surgeon:  [name], MD;  Location: [facility] CATH LAB;  Service: Cardiovascular;;  . PERCUTANEOUS CORONARY ROTOBLATOR INTERVENTION (PCI-R) N/A 08/11/2012   Procedure: PERCUTANEOUS CORONARY ROTOBLATOR INTERVENTION (PCI-R);  Surgeon: [name], MD;  Location: [facility] CATH LAB;  Service: Cardiovascular;  Laterality: N/A;  . PERCUTANEOUS CORONARY STENT INTERVENTION (PCI-S)  10/07/2013   Procedure: PERCUTANEOUS CORONARY STENT INTERVENTION (PCI-S);  Surgeon: [name], MD;  Location: [facility] CATH LAB;  Service: Cardiovascular;;  . PERMANENT PACEMAKER INSERTION N/A 03/16/2012   [name] (SJM) leadless pacemaker (LEADLESS II STUDY PATEINT)  . SALIVARY GLAND SURGERY  2000's   "had a little lump removed; granulosa related; it was benign" (08/11/2012)  . TEE WITHOUT CARDIOVERSION  12/31/2011   Procedure: TRANSESOPHAGEAL ECHOCARDIOGRAM (TEE);  Surgeon: [name], MD;  Location: [facility] ENDOSCOPY;  Service: Cardiovascular;  Laterality: N/A;  . UMBILICAL GRANULOMA EXCISION  2000   2003; 2004; 2007: "all in my abdomen including small intestines, outside my ?uterus/etc" (08/11/2012)  . VARICOSE VEIN SURGERY Bilateral 1977     Current Outpatient Medications  Medication Sig Dispense Refill  . acetaminophen (TYLENOL) 500 MG tablet Take 500 mg by mouth every 6 (six) hours as needed for mild pain or headache.     Marland Kitchen amLODipine  (NORVASC) 5 MG tablet Take 1 tablet (5 mg total) by mouth 2 (two) times daily. 180 tablet 3  . beta carotene w/minerals (OCUVITE) tablet Take 1 tablet by mouth 2 (two) times daily.     . clopidogrel (PLAVIX) 75 MG tablet TAKE 1 TABLET BY MOUTH EVERY DAY 90 tablet 3  . Coenzyme Q-10 100 MG capsule Take 200 mg by mouth daily.     . digoxin (LANOXIN) 0.125 MG tablet Take 0.5 tablets (0.0625 mg total) by mouth daily. 30 tablet 2  . dofetilide (TIKOSYN) 125 MCG capsule TAKE 1 CAPSULE BY MOUTH TWICE A DAY 60 capsule 6  . furosemide (LASIX) 40 MG tablet TAKE 2 TABLETS BY MOUTH EVERY MORNING AND 2 TABLETS AS NEEDED FOR WEIGHT GAIN/ABDOMINAL DISTENSION 120 tablet 2  . gabapentin (NEURONTIN) 300 MG capsule Take 1 capsule (300 mg total) by mouth 2 (two) times daily. 60 capsule 1  . isosorbide mononitrate (IMDUR) 60 MG 24 hr tablet Take 1.5 tablets (90 mg total) by mouth daily. 45 tablet 9  . magnesium oxide (MAG-OX) 400 MG tablet Take 1 tablet (400 mg total) by mouth daily. 60 tablet 6  . metoprolol (TOPROL-XL) 200 MG 24 hr tablet TAKE 1 TABLET DAILY WITH OR IMMEDIATELY FOLLOWING A MEAL. 90 tablet 3  . Multiple Vitamin (MULTIVITAMIN) capsule Take 1 capsule by mouth daily.    . nitroGLYCERIN (NITROSTAT) 0.4 MG SL tablet PLACE 1 TABLET (0.4 MG TOTAL) UNDER THE TONGUE EVERY 5 (FIVE) MINUTES AS NEEDED FOR CHEST PAIN 75 tablet 2  . potassium chloride SA (KLOR-CON) 20 MEQ tablet Take 2 tablets (40 mEq total) by mouth 2 (two) times daily. 120 tablet 8  . rosuvastatin (CRESTOR) 40 MG tablet Take 1 tablet (40 mg total) by mouth daily. 90 tablet 3  . XARELTO 15 MG TABS tablet TAKE 1 TABLET BY MOUTH DAILY WITH SUPPER 30 tablet 5   No current facility-administered medications for this visit.    Allergies:   Bee venom, Other, Oxycodone hcl, Amiodarone, Chlorhexidine, Oxycodone-acetaminophen, and Prednisone    Social History:  The patient  reports that she quit smoking about 46 years ago. Her smoking use included  cigarettes. She has a 32.00 pack-year smoking history. She has never used smokeless  tobacco. She reports current alcohol use of about 7.0 standard drinks of alcohol per week. She reports that she does not use drugs.   Family History:  The patient's family history includes Diabetes in her brother and father; Heart attack in her brother and father; Hypertension in her brother and father; Kidney failure in her brother; Stroke in her mother.    ROS:  Please see the history of present illness.   Otherwise, review of systems are positive for ankle edema.   All other systems are reviewed and negative.    PHYSICAL EXAM: VS:  BP (!) 142/62   Pulse 63   Ht 5' (1.524 m)   Wt 169 lb (76.7 kg)   SpO2 96%   BMI 33.01 kg/m  , BMI Body mass index is 33.01 kg/m. GEN: Well nourished, well developed, in no acute distress  HEENT: normal  Neck: no JVD, carotid bruits, or masses Cardiac: RRR; 3/6 early systolic murmur, no rubs, or gallops,; bilateral ankle edema and bruising of shins Respiratory:  clear to auscultation bilaterally, normal work of breathing GI: soft, nontender, nondistended, + BS MS: no deformity or atrophy  Skin: warm and dry;, bruising noted Neuro:  Strength and sensation are intact Psych: euthymic mood, full affect   EKG:   The ekg ordered today demonstrates V paced rhythm   Recent Labs: 06/09/2019: B Natriuretic Peptide 1,092.6 08/15/2019: Hemoglobin 12.8; Platelets 196 04/19/2020: Magnesium 1.9 05/04/2020: BUN 11; Creatinine, Ser 0.98; Potassium 4.0; Sodium 137   Lipid Panel    Component Value Date/Time   CHOL 112 11/10/2018 0536   CHOL 132 10/08/2017 0905   TRIG 69 11/10/2018 0536   HDL 36 (L) 11/10/2018 0536   HDL 66 10/08/2017 0905   CHOLHDL 3.1 11/10/2018 0536   VLDL 14 11/10/2018 0536   LDLCALC 62 11/10/2018 0536   LDLCALC 52 10/08/2017 0905     Other studies Reviewed: Additional studies/ records that were reviewed today with results demonstrating: Cr  normal.   ASSESSMENT AND PLAN:  1. CAD: s/p multiple PCI. No angina.  Breathing stable. Chronic DOE.    2. AFib: Has been on Tikosyn.  Xarelto for stroke prevention.  3. Chronic diastolic heart failure: Feels that he ankles are swollen.  WEight has been ok at home. Can take 80 mg Lasix in the evening when she feels more swollen.  4. S/p pacer: Followed by EP.  5. CKD: stage III.  Avoid nephrotoxins.  6. HTN: The current medical regimen is effective;  continue present plan and medications. 7. Hyperlipidemia: Continue rosuvastatin.  LDL controlled.  8. Heart murmur: negative echo in 2021. Aortic sclerosis   Current medicines are reviewed at length with the patient today.  The patient concerns regarding her medicines were addressed.  The following changes have been made:  No change  Labs/ tests ordered today include:  No orders of the defined types were placed in this encounter.   Recommend 150 minutes/week of aerobic exercise Low fat, low carb, high fiber diet recommended  Disposition:   FU in 1 year   Signed, Larae Grooms, MD  06/05/2020 2:14 PM    [facility] [address], [place], [place]  [address] Phone: [phone]; Fax: [phone]

## 2020-06-05 ENCOUNTER — Other Ambulatory Visit: Payer: Self-pay

## 2020-06-05 ENCOUNTER — Encounter: Payer: Self-pay | Admitting: Interventional Cardiology

## 2020-06-05 ENCOUNTER — Ambulatory Visit: Payer: Medicare Other | Admitting: Interventional Cardiology

## 2020-06-05 VITALS — BP 142/62 | HR 63 | Ht 60.0 in | Wt 169.0 lb

## 2020-06-05 DIAGNOSIS — I25118 Atherosclerotic heart disease of native coronary artery with other forms of angina pectoris: Secondary | ICD-10-CM

## 2020-06-05 DIAGNOSIS — I4819 Other persistent atrial fibrillation: Secondary | ICD-10-CM

## 2020-06-05 DIAGNOSIS — I5042 Chronic combined systolic (congestive) and diastolic (congestive) heart failure: Secondary | ICD-10-CM

## 2020-06-05 DIAGNOSIS — E782 Mixed hyperlipidemia: Secondary | ICD-10-CM | POA: Diagnosis not present

## 2020-06-05 DIAGNOSIS — I10 Essential (primary) hypertension: Secondary | ICD-10-CM

## 2020-06-05 NOTE — Patient Instructions (Signed)
Medication Instructions:  Your physician recommends that you continue on your current medications as directed. Please refer to the Current Medication list given to you today.  *If you need a refill on your cardiac medications before your next appointment, please call your pharmacy*   Lab Work: none If you have labs (blood work) drawn today and your tests are completely normal, you will receive your results only by: Marland Kitchen MyChart Message (if you have MyChart) OR . A paper copy in the mail If you have any lab test that is abnormal or we need to change your treatment, we will call you to review the results.   Testing/Procedures: none   Follow-Up: At [facility], you and your health needs are our priority.  As part of our continuing mission to provide you with exceptional heart care, we have created designated Provider Care Teams.  These Care Teams include your primary Cardiologist (physician) and Advanced Practice Providers (APPs -  Physician Assistants and Nurse Practitioners) who all work together to provide you with the care you need, when you need it.  We recommend signing up for the patient portal called "MyChart".  Sign up information is provided on this After Visit Summary.  MyChart is used to connect with patients for Virtual Visits (Telemedicine).  Patients are able to view lab/test results, encounter notes, upcoming appointments, etc.  Non-urgent messages can be sent to your provider as well.   To learn more about what you can do with MyChart, go to [email or web address].    Your next appointment:   12 month(s)  The format for your next appointment:   In Person  Provider:   You may see Larae Grooms, MD or one of the following Advanced Practice Providers on your designated Care Team:    Melina Copa, PA-C  Ermalinda Barrios, PA-C    Other Instructions May take extra furosemide in the evening if your legs are swollen

## 2020-06-08 ENCOUNTER — Telehealth: Payer: Self-pay | Admitting: *Deleted

## 2020-06-08 NOTE — Telephone Encounter (Signed)
Spoke with the patient and scheduled a phone visit for 5/12 at 4 pm

## 2020-06-14 ENCOUNTER — Inpatient Hospital Stay: Payer: Medicare Other | Attending: Gynecologic Oncology | Admitting: Gynecologic Oncology

## 2020-06-14 ENCOUNTER — Encounter: Payer: Self-pay | Admitting: Gynecologic Oncology

## 2020-06-14 DIAGNOSIS — D3910 Neoplasm of uncertain behavior of unspecified ovary: Secondary | ICD-10-CM | POA: Diagnosis not present

## 2020-06-14 NOTE — Progress Notes (Signed)
Gynecologic Oncology Telehealth Follow-up Note  I connected with Patty [name] on 06/14/20 at  4:00 PM EDT by telephone and verified that I am speaking with the correct person using two identifiers.  I discussed the limitations, risks, security and privacy concerns of performing an evaluation and management service by telemedicine and the availability of in-person appointments. I also discussed with the patient that there may be a patient responsible charge related to this service. The patient expressed understanding and agreed to proceed.  Other persons participating in the visit and their role in the encounter: none.  Patient's location: home Provider's location: [place]  Chief Complaint:  Chief Complaint  Patient presents with  . Ovarian Cancer   Assessment/Plan:  Patty [name]  is a 87 y.o.  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  CT scan in April, 2022 showed progression which is expected.  Diarrhea resolved by decreasing magnesium dosing.  No interventions recommended, recommend continuing palliative care and hospice.  HPI: Patty [name] is an 87 year old woman who was originally seen in consultation at the request of Dr Patty [name] for granulosa cell ovarian cancer.   The patient's history began in approximately 2000 when she underwent ex lap, TAH, BSO with Dr Patty [name] at [facility]. She did not receive adjuvant chemotherapy. Postoperatively she had recurrences in the peritoneum in 2004, 2007 and 2011.  The 2004 surgery was with Dr Patty [name] and included resection of the recurrent tumor with small bowel resection and colectomy for diverticulosis. She has had a laparoscopic hernia repair with mesh in 2005. In 2007 she underwent resection of pelvic tumor recurrence with Dr Patty [name] and ventral hernia repair with mesh.  In 2011 Dr Patty [name] performed a laparoscopic resection of suprapubic tumor. Her  last CT was in 2010.   She has significant CAD with 6 stents and a history of CHF. She has a pacemaker which she states has a nonfunctioning battery and she is not currently paced. This was placed for tachycadia-bradycardia syndrome. She is obese with OSA. She has atrial fibrillation and is on coumadin.   In October, 2018 she began noting intermittent right lower quadrant pains. Labs at that time showed:  CA 125 12/01/16: 35 Inhibin B 11/21/16: 119  CT abd/pelvis 12/09/16: A small suprapubic hernia is seen along the inferior margin of the surgical mass containing a loop of small bowel. No evidence of bowel obstruction or ischemia. Multiple new soft tissue masses are seen in the left suprapubic region and left lower quadrant, consistent with recurrent tumor. Largest in the left suprapubic region measures 3.8 x 2.8 cm, and 3.8 x 3.4 cm in left lower quadrant. There is also a new mass in the central small bowel mesenteric measuring 5.8 x 5.0 cm. These findings are consistent with recurrent carcinoma and peritoneal metastases.  She was provided with 3 options for proceeding with treatment of her recurrence:  1/ surgical resection and hernia repair. This will involve exploratory laparotomy, radical tumor debulking, possible bowel resection, possible partial cystectomy, and hernia repair with Dr Patty [name] and myself.  She will require medical clearance as she has significant cardiac issues. 2/ chemotherapy with carboplatin and paclitaxel. I discussed that this may not achieve as long a remission as surgical intervention, but would involve a large radical procedure.  3/ A third alternative was hormonal therapy with progestin and tamoxifen in rotation.  She was not sure about which route to pursue, and  given that she was asymptomatic with this slow growing tumor, took some time to reflect. In December, 2018 she received replacement of her pacemaker and was placed on Plavix and coumadin. She met with her  cardiologists, Dr Patty [name] and Dr Patty [name], who felt that she was a candidate for any of the approaches, however, would prefer until she was 1 year out from her last coronary catheterization procedure to be taken off anticoagulants.  CT abd/pelvis in February, 2019 showed essentially stable disease with some mild increases in tumor size, and some decreased, but no new lesions. As she was asymptomatic she elected for expectant management at that time with plan for surgery after she was 1 year out from prior cardiac cath.  On 10/12/17 she was admitted for 5 days with intraperitoneal bleeding from her granulosa cell tumor masses. Her anticoagulant therapy was held as was her plavix and she was transfused. CT imaging on 10/16/17 showed the previously demonstrated 9.4 x 9.2 cm left anterior pelvic hemorrhage currently measures 9.5 x 7.3 cm and is less dense. More anteriorly located hemorrhage on the left previously measures 15.6 x 4.4 cm and currently measures 17.3 x 2.8 cm. A previously demonstrated 2.8 cm cystic and solid midline anterior peritoneal mass at the level of the upper pelvis measures 3.0 cm. The previously demonstrated 8.2 x 5.6 cm cystic and solid left pelvic mass currently measures 7.8 x 5.8 cm. There is less [name] peritoneal blood in the upper abdomen. Anterior hernia repair mesh is again demonstrated. A ventral hernia at the level of the inferior pelvis containing herniated small bowel without obstruction is again demonstrated. This also contains a small amount the urinary bladder.  Given the progression of her peritoneal disease on imaging, she was felt to be a better candidate for chemotherapy first with carboplatin and paclitaxel, followed by consideration for interval debulking if she demonstrates a good response.  10/29/17-03/30/18 she received single agent carboplatin x 7 doses. She was admitted for heart failure after cycle 1. She otherwise tolerated therapy well.  CT imaging showed  response, and in March, 2020 showed progressive response: Multiple intraperitoneal implants, overall similar in size, number and distribution to prior study from 01/04/2018. Likewise, the right lower lobe pulmonary nodules also stable. No definite progression of disease on today's examination.  Inhibin B on 06/02/18: 150  At this point she was changed to anastrazole therapy from chemotherapy given her stable disease.  Patty [name] reported that she had taken only a few days of anastazole due to intolerance (nausea).  Inhbin B from 09/01/18 119.4  CT abd/pelvis from 09/03/18 showed stable disease with slight decrease in one of the masses.  As she poorly tolerated anastrozole, when she was seen in August 2020 she was changed to Megace progestin therapy which was prescribed.  However she did not receive any of this medication as insurance would not cover it.  Therefore in September 2020 she was prescribed tamoxifen which she began daily.  Inhibin B on December 09, 2018 was slightly increased at 166.3.    CT scan of the abdomen and pelvis on December 09, 2018 revealed very slight increase in size of a spiculated nodule in the right lung base adjacent to the right hemidiaphragm now measuring 12 mm (in December 2019 it had measured 10 mm).  The remainder of the peritoneal masses were stable in size including the largest which was a 5.5 cm mass in the left lower quadrant.  No new sites of disease.  Inhibin B was elevated  at 324 on 02/28/19.  Interval Hx:   She had progression of disease on Tamoxifen in March of 2021 based on CA 125 and radiographic progression of peritoneal metastases. She was concerned about side effects of Megace. It was felt that she was not a good candidate for Lupron. Her medical oncologist and the patient made a joint decision to proceed with hospice and palliative care.   She developed diarrhea and her gastroenterology PA ordered a CT scan. It showed progression of the metastatic  disease sites which was expected as she was no longer being treated. Her diarrhea was determined to be secondary to an increased dose of PO magnesium.  Patty [name] is her palliative care nurse and she enjoys her care greatly.   Current Meds:  Outpatient Encounter Medications as of 06/14/2020  Medication Sig  . acetaminophen (TYLENOL) 500 MG tablet Take 500 mg by mouth every 6 (six) hours as needed for mild pain or headache.   Marland Kitchen amLODipine (NORVASC) 5 MG tablet Take 1 tablet (5 mg total) by mouth 2 (two) times daily.  . beta carotene w/minerals (OCUVITE) tablet Take 1 tablet by mouth 2 (two) times daily.   . clopidogrel (PLAVIX) 75 MG tablet TAKE 1 TABLET BY MOUTH EVERY DAY  . Coenzyme Q-10 100 MG capsule Take 200 mg by mouth daily.   . digoxin (LANOXIN) 0.125 MG tablet Take 0.5 tablets (0.0625 mg total) by mouth daily.  Marland Kitchen dofetilide (TIKOSYN) 125 MCG capsule TAKE 1 CAPSULE BY MOUTH TWICE A DAY  . furosemide (LASIX) 40 MG tablet TAKE 2 TABLETS BY MOUTH EVERY MORNING AND 2 TABLETS AS NEEDED FOR WEIGHT GAIN/ABDOMINAL DISTENSION  . gabapentin (NEURONTIN) 300 MG capsule Take 1 capsule (300 mg total) by mouth 2 (two) times daily.  . isosorbide mononitrate (IMDUR) 60 MG 24 hr tablet Take 1.5 tablets (90 mg total) by mouth daily.  . magnesium oxide (MAG-OX) 400 MG tablet Take 1 tablet (400 mg total) by mouth daily.  . metoprolol (TOPROL-XL) 200 MG 24 hr tablet TAKE 1 TABLET DAILY WITH OR IMMEDIATELY FOLLOWING A MEAL.  . Multiple Vitamin (MULTIVITAMIN) capsule Take 1 capsule by mouth daily.  . nitroGLYCERIN (NITROSTAT) 0.4 MG SL tablet PLACE 1 TABLET (0.4 MG TOTAL) UNDER THE TONGUE EVERY 5 (FIVE) MINUTES AS NEEDED FOR CHEST PAIN  . potassium chloride SA (KLOR-CON) 20 MEQ tablet Take 2 tablets (40 mEq total) by mouth 2 (two) times daily.  . rosuvastatin (CRESTOR) 40 MG tablet Take 1 tablet (40 mg total) by mouth daily.  Alveda Reasons 15 MG TABS tablet TAKE 1 TABLET BY MOUTH DAILY WITH SUPPER   No  facility-administered encounter medications on file as of 06/14/2020.    Allergy:  Allergies  Allergen Reactions  . Bee Venom Anaphylaxis  . Other Nausea And Vomiting and Other (See Comments)    Pain medications cause severe vomiting. Tolerated slow IV morphine drip  . Oxycodone Hcl Nausea And Vomiting  . Amiodarone Nausea Only  . Chlorhexidine   . Oxycodone-Acetaminophen Other (See Comments)  . Prednisone Palpitations and Other (See Comments)    "Rapid Heart Beat"    Social Hx:   Social History   Socioeconomic History  . Marital status: Widowed    Spouse name: Not on file  . Number of children: 4  . Years of education: Not on file  . Highest education level: Not on file  Occupational History  . Occupation: Retired Nurse, learning disability estate  Tobacco Use  . Smoking status: Former Smoker  Packs/day: 1.00    Years: 32.00    Pack years: 32.00    Types: Cigarettes    Quit date: 02/03/1974    Years since quitting: 46.3  . Smokeless tobacco: Never Used  Vaping Use  . Vaping Use: Never used  Substance and Sexual Activity  . Alcohol use: Yes    Alcohol/week: 7.0 standard drinks    Types: 7 Glasses of wine per week    Comment: one glass wine nightly with dinner  . Drug use: No  . Sexual activity: Never  Other Topics Concern  . Not on file  Social History Narrative   Lives alone.   Social Determinants of Health   Financial Resource Strain: Not on file  Food Insecurity: Not on file  Transportation Needs: Not on file  Physical Activity: Not on file  Stress: Not on file  Social Connections: Not on file  Intimate Partner Violence: Not on file    Past Surgical Hx:  Past Surgical History:  Procedure Laterality Date  . ABDOMINAL HYSTERECTOMY    . CARDIAC CATHETERIZATION  09/03/2007   EF 70%; Failed attempt at PCI to OM  . CARDIAC CATHETERIZATION  11/01/2003   EF 70%  . CARDIAC CATHETERIZATION N/A 12/14/2015   Procedure: Left Heart Cath and Coronary Angiography;   Surgeon: Burnell Blanks, MD;  Location: [place] CV LAB;  Service: Cardiovascular;  Laterality: N/A;  . CARDIOVERSION  12/31/2011   Procedure: CARDIOVERSION;  Surgeon: [name], MD;  Location: [facility] ENDOSCOPY;  Service: Cardiovascular;  Laterality: N/A;  . CARDIOVERSION N/A 12/31/2011   Procedure: CARDIOVERSION;  Surgeon: [name], MD;  Location: [facility] CATH LAB;  Service: Cardiovascular;  Laterality: N/A;  . CARDIOVERSION N/A 11/15/2018   Procedure: CARDIOVERSION;  Surgeon: Dorothy Spark, MD;  Location: [facility] ENDOSCOPY;  Service: Cardiovascular;  Laterality: N/A;  . CARDIOVERSION N/A 06/10/2019   Procedure: CARDIOVERSION;  Surgeon: Fay Records, MD;  Location: [facility] ENDOSCOPY;  Service: Cardiovascular;  Laterality: N/A;  . CARDIOVERSION N/A 06/14/2019   Procedure: CARDIOVERSION;  Surgeon: Pixie Casino, MD;  Location: [place];  Service: Cardiovascular;  Laterality: N/A;  . CATARACT EXTRACTION, BILATERAL  2015  . CHOLECYSTECTOMY  1980's  . COLON SURGERY  2004   colectomy for diverticulosis  . CORONARY ANGIOPLASTY WITH STENT PLACEMENT  2000    and 08/11/2012; 11/12/2012: 3 + 2 LAD & CFX; 2nd CFX stent 11/12/2012  . CORONARY ATHERECTOMY N/A 07/15/2016   Procedure: Coronary Atherectomy;  Surgeon: Martinique, Peter M, MD;  Location: [place] CV LAB;  Service: Cardiovascular;  Laterality: N/A;  . CORONARY BALLOON ANGIOPLASTY N/A 07/14/2016   Procedure: Coronary Balloon Angioplasty;  Surgeon: Martinique, Peter M, MD;  Location: [place] CV LAB;  Service: Cardiovascular;  Laterality: N/A;  . CORONARY STENT INTERVENTION N/A 07/15/2016   Procedure: Coronary Stent Intervention;  Surgeon: Martinique, Peter M, MD;  Location: [place] CV LAB;  Service: Cardiovascular;  Laterality: N/A;  . FRACTIONAL FLOW RESERVE WIRE  10/07/2013   Procedure: [place];  Surgeon: [name], MD;  Location: [facility] CATH LAB;  Service: Cardiovascular;;  . HERNIA REPAIR  2005    "laparoscopic"  . IR IMAGING GUIDED PORT INSERTION  10/28/2017  . LEFT HEART CATH AND CORONARY ANGIOGRAPHY N/A 07/14/2016   Procedure: Left Heart Cath and Coronary Angiography;  Surgeon: Martinique, Peter M, MD;  Location: [place] CV LAB;  Service: Cardiovascular;  Laterality: N/A;  . LEFT HEART CATH AND CORONARY ANGIOGRAPHY N/A 11/10/2018  Procedure: LEFT HEART CATH AND CORONARY ANGIOGRAPHY;  Surgeon: Wellington Hampshire, MD;  Location: [place] CV LAB;  Service: Cardiovascular;  Laterality: N/A;  . LEFT HEART CATHETERIZATION WITH CORONARY ANGIOGRAM N/A 11/12/2012   Procedure: LEFT HEART CATHETERIZATION WITH CORONARY ANGIOGRAM;  Surgeon: [name], MD;  Location: [facility] CATH LAB;  Service: Cardiovascular;  Laterality: N/A;  . LEFT HEART CATHETERIZATION WITH CORONARY ANGIOGRAM N/A 10/07/2013   Procedure: LEFT HEART CATHETERIZATION WITH CORONARY ANGIOGRAM;  Surgeon: [name], MD;  Location: [facility] CATH LAB;  Service: Cardiovascular;  Laterality: N/A;  . LEFT HEART CATHETERIZATION WITH CORONARY ANGIOGRAM N/A 12/14/2013   Procedure: LEFT HEART CATHETERIZATION WITH CORONARY ANGIOGRAM;  Surgeon: Sinclair Grooms, MD;  Location: [name] CATH LAB;  Service: Cardiovascular;  Laterality: N/A;  . LEFT HEART CATHETERIZATION WITH CORONARY ANGIOGRAM N/A 05/16/2014   Procedure: LEFT HEART CATHETERIZATION WITH CORONARY ANGIOGRAM;  Surgeon: Sherren Mocha, MD;  Location: [facility] CATH LAB;  Service: Cardiovascular;  Laterality: N/A;  . PERCUTANEOUS CORONARY INTERVENTION-BALLOON ONLY  08/04/2012   Procedure: PERCUTANEOUS CORONARY INTERVENTION-BALLOON ONLY;  Surgeon: [name], MD;  Location: [facility] CATH LAB;  Service: Cardiovascular;;  . PERCUTANEOUS CORONARY ROTOBLATOR INTERVENTION (PCI-R) N/A 08/11/2012   Procedure: PERCUTANEOUS CORONARY ROTOBLATOR INTERVENTION (PCI-R);  Surgeon: [name], MD;  Location: [facility] CATH LAB;  Service: Cardiovascular;  Laterality: N/A;  . PERCUTANEOUS CORONARY STENT  INTERVENTION (PCI-S)  10/07/2013   Procedure: PERCUTANEOUS CORONARY STENT INTERVENTION (PCI-S);  Surgeon: [name], MD;  Location: [facility] CATH LAB;  Service: Cardiovascular;;  . PERMANENT PACEMAKER INSERTION N/A 03/16/2012   [name] (SJM) leadless pacemaker (LEADLESS II STUDY PATEINT)  . SALIVARY GLAND SURGERY  2000's   "had a little lump removed; granulosa related; it was benign" (08/11/2012)  . TEE WITHOUT CARDIOVERSION  12/31/2011   Procedure: TRANSESOPHAGEAL ECHOCARDIOGRAM (TEE);  Surgeon: [name], MD;  Location: [facility] ENDOSCOPY;  Service: Cardiovascular;  Laterality: N/A;  . UMBILICAL GRANULOMA EXCISION  2000   2003; 2004; 2007: "all in my abdomen including small intestines, outside my ?uterus/etc" (08/11/2012)  . VARICOSE VEIN SURGERY Bilateral 1977    Past Medical Hx:  Past Medical History:  Diagnosis Date  . Bleeding behind the abdominal cavity 10/2017  . Chronic anticoagulation - coumadin, CHADS2VASC=6 05/17/2015  . CKD (chronic kidney disease), stage III ([place])   . Combined systolic and diastolic heart failure ([place])   . Coronary artery disease    a. s/p multiple stents - stenting x 2 to the LAD and x 1 to the LCx in 2000, rotational arthrectomy to proximal LCx and DES to LAD in 2014 and DES to LCx in 2014, along with DES to LAD for re-in-stent stenosis in 2015, and DES to ostial LAD in 2016. b. 07/2016 - orbital atherectomy & DES to mid LCx.  . Diverticulitis   . Granulosa cell carcinoma ([place])    abd; last episode was in 2009  . Hyperlipidemia   . Hypertension   . LBBB (left bundle branch block)   . Myocardial infarction ([place]) 2002  . Obesity   . OSA on CPAP   . Ovarian ca ([place]) 2019  . Pacemaker failure    a. Prior leadless PPM with premature battery failure, being managed conservatively without replacement.  . Peripheral vascular disease ([place])   . Permanent atrial fibrillation ([place]) 2013  . Renal artery stenosis ([place])   . Tachycardia-bradycardia syndrome ([place])     a. s/p leadless pacemaker ([name]) implanted by Dr Patty [name]  . Varicose veins  Past Gynecological History:  Granulosa cell tumor of the ovary No LMP recorded. Patient has had a hysterectomy.  Family Hx:  Family History  Problem Relation Age of Onset  . Stroke Mother   . Heart attack Father   . Diabetes Father   . Hypertension Father   . Heart attack Brother   . Diabetes Brother   . Hypertension Brother   . Kidney failure Brother     Review of Systems:  Constitutional  Feels fatigued    ENT Normal appearing ears and nares bilaterally Skin/Breast  No rash, sores, jaundice, itching, dryness Cardiovascular  No chest pain, shortness of breath, or edema  Pulmonary  No cough or wheeze.  Gastro Intestinal  No nausea, vomitting, or diarrhoea. No bright red blood per rectum, no abdominal pain, change in bowel movement, or constipation.  Genito Urinary  No frequency, urgency, dysuria, no pelvic pain Musculo Skeletal  No myalgia, arthralgia, joint swelling or pain  Neurologic  No weakness, change in gait, + neuropathy Psychology  No depression, anxiety, insomnia.   Vitals:  There were no vitals taken for this visit.  Physical Exam: Deferred.  I discussed the assessment and treatment plan with the patient. The patient was provided with an opportunity to ask questions and all were answered. The patient agreed with the plan and demonstrated an understanding of the instructions.   The patient was advised to call back or see an in-person evaluation if the symptoms worsen or if the condition fails to improve as anticipated.   I provided 15 minutes of non face-to-face telephone visit time during this encounter, and > 50% was spent counseling as documented under my assessment & plan.   Thereasa Solo, MD  06/14/2020, 4:10 PM

## 2020-07-02 ENCOUNTER — Other Ambulatory Visit: Payer: Self-pay | Admitting: Interventional Cardiology

## 2020-07-09 ENCOUNTER — Other Ambulatory Visit (HOSPITAL_COMMUNITY): Payer: Self-pay | Admitting: *Deleted

## 2020-07-09 MED ORDER — MAGNESIUM OXIDE 400 MG PO TABS: 400.0000 mg | ORAL_TABLET | Freq: Every day | ORAL | 6 refills | Status: AC

## 2020-07-11 ENCOUNTER — Telehealth: Payer: Self-pay | Admitting: Interventional Cardiology

## 2020-07-11 MED ORDER — DIGOXIN 125 MCG PO TABS: 0.0625 mg | ORAL_TABLET | Freq: Every day | ORAL | 11 refills | Status: AC

## 2020-07-11 NOTE — Telephone Encounter (Signed)
*  STAT* If patient is at the pharmacy, call can be transferred to refill team.   1. Which medications need to be refilled? (please list name of each medication and dose if known)  digoxin (LANOXIN) 0.125 MG tablet  2. Which pharmacy/location (including street and city if local pharmacy) is medication to be sent to? [place], [place].  3. Do they need a 30 day or 90 day supply?  30 day supply  Patient states she has 3 tablets remaining.

## 2020-07-11 NOTE — Telephone Encounter (Signed)
Pt's medication was sent to pt's pharmacy as requested. Confirmation received.  °

## 2020-08-03 ENCOUNTER — Encounter: Payer: Self-pay | Admitting: Hematology and Oncology

## 2020-08-07 ENCOUNTER — Ambulatory Visit (INDEPENDENT_AMBULATORY_CARE_PROVIDER_SITE_OTHER): Payer: Medicare Other

## 2020-08-07 DIAGNOSIS — I495 Sick sinus syndrome: Secondary | ICD-10-CM | POA: Diagnosis not present

## 2020-08-08 LAB — CUP PACEART REMOTE DEVICE CHECK
Battery Remaining Longevity: 77 mo
Battery Remaining Percentage: 66 %
Battery Voltage: 2.99 V
Brady Statistic AP VP Percent: 31 %
Brady Statistic AP VS Percent: 3.8 %
Brady Statistic AS VP Percent: 43 %
Brady Statistic AS VS Percent: 22 %
Brady Statistic RA Percent Paced: 35 %
Brady Statistic RV Percent Paced: 74 %
Date Time Interrogation Session: 20220705232634
Implantable Lead Implant Date: 20181224
Implantable Lead Implant Date: 20181224
Implantable Lead Location: 753859
Implantable Lead Location: 753860
Implantable Pulse Generator Implant Date: 20181224
Lead Channel Impedance Value: 1025 Ohm
Lead Channel Impedance Value: 450 Ohm
Lead Channel Pacing Threshold Amplitude: 0.75 V
Lead Channel Pacing Threshold Amplitude: 0.75 V
Lead Channel Pacing Threshold Pulse Width: 0.4 ms
Lead Channel Pacing Threshold Pulse Width: 0.4 ms
Lead Channel Sensing Intrinsic Amplitude: 11.2 mV
Lead Channel Sensing Intrinsic Amplitude: 4.9 mV
Lead Channel Setting Pacing Amplitude: 2.5 V
Lead Channel Setting Pacing Amplitude: 2.5 V
Lead Channel Setting Pacing Pulse Width: 0.4 ms
Lead Channel Setting Sensing Sensitivity: 2 mV
Pulse Gen Model: 2272
Pulse Gen Serial Number: 8968200

## 2020-08-13 ENCOUNTER — Other Ambulatory Visit: Payer: Self-pay

## 2020-08-13 ENCOUNTER — Ambulatory Visit: Payer: Medicare Other | Admitting: Podiatry

## 2020-08-13 ENCOUNTER — Encounter: Payer: Self-pay | Admitting: Podiatry

## 2020-08-13 DIAGNOSIS — G620 Drug-induced polyneuropathy: Secondary | ICD-10-CM

## 2020-08-13 DIAGNOSIS — D689 Coagulation defect, unspecified: Secondary | ICD-10-CM

## 2020-08-13 DIAGNOSIS — Z7901 Long term (current) use of anticoagulants: Secondary | ICD-10-CM

## 2020-08-13 DIAGNOSIS — T451X5A Adverse effect of antineoplastic and immunosuppressive drugs, initial encounter: Secondary | ICD-10-CM

## 2020-08-13 DIAGNOSIS — M79676 Pain in unspecified toe(s): Secondary | ICD-10-CM

## 2020-08-13 DIAGNOSIS — B351 Tinea unguium: Secondary | ICD-10-CM | POA: Diagnosis not present

## 2020-08-13 NOTE — Progress Notes (Signed)
This patient returns to my office for at risk foot care.  This patient requires this care by a professional since this patient will be at risk due to having peripheral neuropathy, and coagulation defect.  Patient is taking plavix and xarelto.  This patient is unable to cut nails herself since the patient cannot reach her nails.These nails are painful walking and wearing shoes.  This patient presents for at risk foot care today.  General Appearance  Alert, conversant and in no acute stress.  Vascular  Dorsalis pedis and posterior tibial  pulses are palpable  bilaterally.  Capillary return is within normal limits  bilaterally. Temperature is within normal limits  bilaterally.  Neurologic  Senn-Weinstein monofilament wire test within normal limits  bilaterally. Muscle power within normal limits bilaterally.  Nails Thick disfigured discolored nails with subungual debris  from hallux to fifth toes bilaterally. No evidence of bacterial infection or drainage bilaterally.  Orthopedic  No limitations of motion  feet .  No crepitus or effusions noted.  No bony pathology or digital deformities noted.  HAV  B/L.  Skin  normotropic skin with no porokeratosis noted bilaterally.  No signs of infections or ulcers noted.  Pinch callus left hallux.   Onychomycosis  Pain in right toes  Pain in left toes  Consent was obtained for treatment procedures.   Mechanical debridement of nails 1-5  bilaterally performed with a nail nipper.  Filed with dremel without incident.    Return office visit   9  weeks                  Told patient to return for periodic foot care and evaluation due to potential at risk complications.   Gardiner Barefoot DPM

## 2020-08-15 ENCOUNTER — Telehealth: Payer: Self-pay | Admitting: Interventional Cardiology

## 2020-08-15 NOTE — Telephone Encounter (Signed)
Spoke with the patient who reports that the swelling in her feet has worsened over the past couple of weeks. She states there is also some swelling in her legs. She denies any weight gain. She states that her vital signs have been good. She is not sure of exact readings but systolic has been between 116-130. She states that she does have SOB but only at times with exertion and has not gotten any worse. She states that her legs also ache and sometimes she has difficulty walking but this is also not new. She reports that her palliative care has set her up with [employer] which has provided her with a scale and BP/HR monitor that is connected to her phone. She weighs and takes vitals daily and they receive the readings electronically. She states that all of her readings have been good. She has been avoiding sodium in her diet. She admits to not elevating her feet very often which I have encouraged her to do. She does not have compression hose. She takes 2 tablets (80 mg) of morning and 1-2 tablets every afternoon. Advised patient to take 2 every morning and 2 every afternoon. She will continue to monitor and let us know if anything worsens.

## 2020-08-15 NOTE — Telephone Encounter (Signed)
Pt c/o swelling: STAT is pt has developed SOB within 24 hours  How much weight have you gained and in what time span? No pt's weight is steady  If swelling, where is the swelling located? Legs and feet  Are you currently taking a fluid pill? Yes up to 4 per day  Are you currently SOB? No  Do you have a log of your daily weights (if so, list)? No pt is 162.4  Have you gained 3 pounds in a day or 5 pounds in a week? No  Have you traveled recently? No but 07/14/20 pt traveled to [place], Pt would prefer a return call on her house phone but she is going to find her cellphone and turn it on shortly.

## 2020-08-23 ENCOUNTER — Other Ambulatory Visit: Payer: Self-pay | Admitting: Interventional Cardiology

## 2020-08-24 NOTE — Progress Notes (Signed)
Remote pacemaker transmission.   

## 2020-09-01 ENCOUNTER — Other Ambulatory Visit (HOSPITAL_COMMUNITY): Payer: Self-pay | Admitting: Nurse Practitioner

## 2020-09-13 ENCOUNTER — Other Ambulatory Visit: Payer: Self-pay

## 2020-09-13 ENCOUNTER — Inpatient Hospital Stay (HOSPITAL_COMMUNITY)
Admission: EM | Admit: 2020-09-13 | Discharge: 2020-09-17 | DRG: 755 | Disposition: A | Discharge status: Home / Self Care | Payer: Medicare Other | Attending: Internal Medicine | Admitting: Internal Medicine

## 2020-09-13 ENCOUNTER — Emergency Department (HOSPITAL_COMMUNITY): Payer: Medicare Other

## 2020-09-13 ENCOUNTER — Encounter (HOSPITAL_COMMUNITY): Payer: Self-pay

## 2020-09-13 DIAGNOSIS — C7801 Secondary malignant neoplasm of right lung: Secondary | ICD-10-CM | POA: Diagnosis present

## 2020-09-13 DIAGNOSIS — N183 Chronic kidney disease, stage 3 unspecified: Secondary | ICD-10-CM | POA: Diagnosis present

## 2020-09-13 DIAGNOSIS — Z7902 Long term (current) use of antithrombotics/antiplatelets: Secondary | ICD-10-CM

## 2020-09-13 DIAGNOSIS — Z7901 Long term (current) use of anticoagulants: Secondary | ICD-10-CM

## 2020-09-13 DIAGNOSIS — R1032 Left lower quadrant pain: Secondary | ICD-10-CM

## 2020-09-13 DIAGNOSIS — Z9842 Cataract extraction status, left eye: Secondary | ICD-10-CM | POA: Diagnosis not present

## 2020-09-13 DIAGNOSIS — I130 Hypertensive heart and chronic kidney disease with heart failure and stage 1 through stage 4 chronic kidney disease, or unspecified chronic kidney disease: Secondary | ICD-10-CM | POA: Diagnosis present

## 2020-09-13 DIAGNOSIS — Z515 Encounter for palliative care: Secondary | ICD-10-CM | POA: Diagnosis not present

## 2020-09-13 DIAGNOSIS — Z66 Do not resuscitate: Secondary | ICD-10-CM | POA: Diagnosis present

## 2020-09-13 DIAGNOSIS — E785 Hyperlipidemia, unspecified: Secondary | ICD-10-CM | POA: Diagnosis present

## 2020-09-13 DIAGNOSIS — Z841 Family history of disorders of kidney and ureter: Secondary | ICD-10-CM

## 2020-09-13 DIAGNOSIS — Z90710 Acquired absence of both cervix and uterus: Secondary | ICD-10-CM | POA: Diagnosis not present

## 2020-09-13 DIAGNOSIS — Z79899 Other long term (current) drug therapy: Secondary | ICD-10-CM

## 2020-09-13 DIAGNOSIS — Z961 Presence of intraocular lens: Secondary | ICD-10-CM | POA: Diagnosis present

## 2020-09-13 DIAGNOSIS — Z91030 Bee allergy status: Secondary | ICD-10-CM

## 2020-09-13 DIAGNOSIS — C569 Malignant neoplasm of unspecified ovary: Principal | ICD-10-CM | POA: Diagnosis present

## 2020-09-13 DIAGNOSIS — M1612 Unilateral primary osteoarthritis, left hip: Secondary | ICD-10-CM | POA: Diagnosis present

## 2020-09-13 DIAGNOSIS — Z20822 Contact with and (suspected) exposure to covid-19: Secondary | ICD-10-CM | POA: Diagnosis present

## 2020-09-13 DIAGNOSIS — Z9049 Acquired absence of other specified parts of digestive tract: Secondary | ICD-10-CM | POA: Diagnosis not present

## 2020-09-13 DIAGNOSIS — C799 Secondary malignant neoplasm of unspecified site: Secondary | ICD-10-CM | POA: Diagnosis not present

## 2020-09-13 DIAGNOSIS — Z7189 Other specified counseling: Secondary | ICD-10-CM | POA: Diagnosis not present

## 2020-09-13 DIAGNOSIS — Z9841 Cataract extraction status, right eye: Secondary | ICD-10-CM | POA: Diagnosis not present

## 2020-09-13 DIAGNOSIS — I252 Old myocardial infarction: Secondary | ICD-10-CM | POA: Diagnosis not present

## 2020-09-13 DIAGNOSIS — Z87891 Personal history of nicotine dependence: Secondary | ICD-10-CM

## 2020-09-13 DIAGNOSIS — Z8249 Family history of ischemic heart disease and other diseases of the circulatory system: Secondary | ICD-10-CM

## 2020-09-13 DIAGNOSIS — G4733 Obstructive sleep apnea (adult) (pediatric): Secondary | ICD-10-CM | POA: Diagnosis present

## 2020-09-13 DIAGNOSIS — Z885 Allergy status to narcotic agent status: Secondary | ICD-10-CM

## 2020-09-13 DIAGNOSIS — Z950 Presence of cardiac pacemaker: Secondary | ICD-10-CM

## 2020-09-13 DIAGNOSIS — I2510 Atherosclerotic heart disease of native coronary artery without angina pectoris: Secondary | ICD-10-CM | POA: Diagnosis present

## 2020-09-13 DIAGNOSIS — I739 Peripheral vascular disease, unspecified: Secondary | ICD-10-CM | POA: Diagnosis present

## 2020-09-13 DIAGNOSIS — I5042 Chronic combined systolic (congestive) and diastolic (congestive) heart failure: Secondary | ICD-10-CM | POA: Diagnosis present

## 2020-09-13 DIAGNOSIS — Z955 Presence of coronary angioplasty implant and graft: Secondary | ICD-10-CM | POA: Diagnosis not present

## 2020-09-13 DIAGNOSIS — I4821 Permanent atrial fibrillation: Secondary | ICD-10-CM | POA: Diagnosis present

## 2020-09-13 DIAGNOSIS — Z888 Allergy status to other drugs, medicaments and biological substances status: Secondary | ICD-10-CM

## 2020-09-13 LAB — COMPREHENSIVE METABOLIC PANEL
ALT: 16 U/L (ref 0–44)
AST: 35 U/L (ref 15–41)
Albumin: 3.1 g/dL — ABNORMAL LOW (ref 3.5–5.0)
Alkaline Phosphatase: 80 U/L (ref 38–126)
Anion gap: 9 (ref 5–15)
BUN: 14 mg/dL (ref 8–23)
CO2: 27 mmol/L (ref 22–32)
Calcium: 8.8 mg/dL — ABNORMAL LOW (ref 8.9–10.3)
Chloride: 103 mmol/L (ref 98–111)
Creatinine, Ser: 0.85 mg/dL (ref 0.44–1.00)
GFR, Estimated: >60 mL/min (ref >60)
Glucose, Bld: 89 mg/dL (ref 70–99)
Potassium: 3.8 mmol/L (ref 3.5–5.1)
Sodium: 139 mmol/L (ref 135–145)
Total Bilirubin: 0.6 mg/dL (ref 0.3–1.2)
Total Protein: 7.3 g/dL (ref 6.5–8.1)

## 2020-09-13 LAB — URINALYSIS, ROUTINE W REFLEX MICROSCOPIC
Bilirubin Urine: NEGATIVE
Glucose, UA: NEGATIVE mg/dL
Hgb urine dipstick: NEGATIVE
Ketones, ur: 20 mg/dL — AB
Leukocytes,Ua: NEGATIVE
Nitrite: NEGATIVE
Protein, ur: NEGATIVE mg/dL
Specific Gravity, Urine: 1.032 — ABNORMAL HIGH (ref 1.005–1.030)
pH: 7 (ref 5.0–8.0)

## 2020-09-13 LAB — RESP PANEL BY RT-PCR (FLU A&B, COVID) ARPGX2
Influenza A by PCR: NEGATIVE
Influenza B by PCR: NEGATIVE
SARS Coronavirus 2 by RT PCR: NEGATIVE

## 2020-09-13 LAB — CBC WITH DIFFERENTIAL/PLATELET
Abs Immature Granulocytes: 0.03 10*3/uL (ref 0.00–0.07)
Basophils Absolute: 0 10*3/uL (ref 0.0–0.1)
Basophils Relative: 1 %
Eosinophils Absolute: 0.1 10*3/uL (ref 0.0–0.5)
Eosinophils Relative: 1 %
HCT: 37.2 % (ref 36.0–46.0)
Hemoglobin: 12.2 g/dL (ref 12.0–15.0)
Immature Granulocytes: 0 %
Lymphocytes Relative: 13 %
Lymphs Abs: 1.1 10*3/uL (ref 0.7–4.0)
MCH: 32.1 pg (ref 26.0–34.0)
MCHC: 32.8 g/dL (ref 30.0–36.0)
MCV: 97.9 fL (ref 80.0–100.0)
Monocytes Absolute: 0.6 10*3/uL (ref 0.1–1.0)
Monocytes Relative: 8 %
Neutro Abs: 6.6 10*3/uL (ref 1.7–7.7)
Neutrophils Relative %: 77 %
Platelets: 293 10*3/uL (ref 150–400)
RBC: 3.8 MIL/uL — ABNORMAL LOW (ref 3.87–5.11)
RDW: 14.4 % (ref 11.5–15.5)
WBC: 8.4 10*3/uL (ref 4.0–10.5)
nRBC: 0 % (ref 0.0–0.2)

## 2020-09-13 LAB — SEDIMENTATION RATE: Sed Rate: 96 mm/hr — ABNORMAL HIGH (ref 0–22)

## 2020-09-13 LAB — PROTIME-INR
INR: 1.1 (ref 0.8–1.2)
Prothrombin Time: 14.6 seconds (ref 11.4–15.2)

## 2020-09-13 LAB — LIPASE, BLOOD: Lipase: 39 U/L (ref 11–51)

## 2020-09-13 LAB — C-REACTIVE PROTEIN: CRP: 3.7 mg/dL — ABNORMAL HIGH (ref <1.0)

## 2020-09-13 LAB — APTT: aPTT: 30 seconds (ref 24–36)

## 2020-09-13 LAB — LACTIC ACID, PLASMA: Lactic Acid, Venous: 1.2 mmol/L (ref 0.5–1.9)

## 2020-09-13 MED ORDER — ACETAMINOPHEN 325 MG PO TABS
650.0000 mg | ORAL_TABLET | Freq: Four times a day (QID) | ORAL | Status: DC | PRN
  Administered 2020-09-13: 650 mg via ORAL
  Filled 2020-09-13: qty 2

## 2020-09-13 MED ORDER — OXYCODONE HCL 5 MG PO TABS: 5.0000 mg | ORAL_TABLET | ORAL | Status: DC | PRN

## 2020-09-13 MED ORDER — ACETAMINOPHEN 650 MG RE SUPP: 650.0000 mg | Freq: Four times a day (QID) | RECTAL | Status: DC | PRN

## 2020-09-13 MED ORDER — ONDANSETRON HCL 4 MG/2ML IJ SOLN
4.0000 mg | Freq: Four times a day (QID) | INTRAMUSCULAR | Status: DC | PRN
  Administered 2020-09-13 – 2020-09-15 (×4): 4 mg via INTRAVENOUS
  Filled 2020-09-13 (×4): qty 2

## 2020-09-13 MED ORDER — BISACODYL 10 MG RE SUPP: 10.0000 mg | Freq: Every day | RECTAL | Status: DC | PRN

## 2020-09-13 MED ORDER — POLYETHYLENE GLYCOL 3350 17 G PO PACK: 17.0000 g | PACK | Freq: Every day | ORAL | Status: DC | PRN

## 2020-09-13 MED ORDER — ENOXAPARIN SODIUM 40 MG/0.4ML IJ SOSY
40.0000 mg | PREFILLED_SYRINGE | INTRAMUSCULAR | Status: DC
  Administered 2020-09-13 – 2020-09-16 (×4): 40 mg via SUBCUTANEOUS
  Filled 2020-09-13 (×4): qty 0.4

## 2020-09-13 MED ORDER — METHOCARBAMOL 1000 MG/10ML IJ SOLN
500.0000 mg | Freq: Four times a day (QID) | INTRAVENOUS | Status: DC | PRN
  Administered 2020-09-16: 500 mg via INTRAVENOUS
  Filled 2020-09-13: qty 5
  Filled 2020-09-13: qty 500

## 2020-09-13 MED ORDER — ONDANSETRON HCL 4 MG PO TABS
4.0000 mg | ORAL_TABLET | Freq: Four times a day (QID) | ORAL | Status: DC | PRN
  Filled 2020-09-13: qty 1

## 2020-09-13 MED ORDER — ONDANSETRON HCL 4 MG/2ML IJ SOLN
4.0000 mg | Freq: Once | INTRAMUSCULAR | Status: AC
  Administered 2020-09-13: 4 mg via INTRAVENOUS
  Filled 2020-09-13: qty 2

## 2020-09-13 MED ORDER — SENNA 8.6 MG PO TABS
1.0000 | ORAL_TABLET | Freq: Two times a day (BID) | ORAL | Status: DC
  Administered 2020-09-14 – 2020-09-17 (×7): 8.6 mg via ORAL
  Filled 2020-09-13 (×8): qty 1

## 2020-09-13 MED ORDER — MORPHINE SULFATE (PF) 4 MG/ML IV SOLN
4.0000 mg | Freq: Once | INTRAVENOUS | Status: AC
Start: 2020-09-13 — End: 2020-09-13
  Administered 2020-09-13: 4 mg via INTRAVENOUS
  Filled 2020-09-13: qty 1

## 2020-09-13 MED ORDER — MORPHINE SULFATE (PF) 2 MG/ML IV SOLN
2.0000 mg | INTRAVENOUS | Status: DC | PRN
  Administered 2020-09-13 – 2020-09-14 (×2): 2 mg via INTRAVENOUS
  Filled 2020-09-13 (×2): qty 1

## 2020-09-13 MED ORDER — LACTATED RINGERS IV BOLUS (SEPSIS)
1000.0000 mL | Freq: Once | INTRAVENOUS | Status: AC
  Administered 2020-09-13: 1000 mL via INTRAVENOUS

## 2020-09-13 MED ORDER — FLEET ENEMA 7-19 GM/118ML RE ENEM: 1.0000 | ENEMA | Freq: Once | RECTAL | Status: DC | PRN

## 2020-09-13 MED ORDER — IOHEXOL 350 MG/ML SOLN
75.0000 mL | Freq: Once | INTRAVENOUS | Status: AC | PRN
  Administered 2020-09-13: 75 mL via INTRAVENOUS

## 2020-09-13 NOTE — ED Provider Notes (Signed)
[place] DEPT Provider Note   CSN: [id number] Arrival date & time: 09/13/20  [id number]     History Chief Complaint  Patient presents with   Nausea   Abdominal Pain    LLQ   Weakness   Hip Pain    Patty [name] is a 87 y.o. female with PMH granulosa ovarian cancer, CKD 3, CAD, A. fib, renal artery stenosis, peripheral vascular disease, loop recorder and pacemaker placement who presents to the emergency department for evaluation of left lower quadrant abdominal pain and left hip pain.  She states that the pain has been increasing over the last 7 days and she has felt weaker over the last 1 week.  Denies nausea, vomiting, chest pain, shortness of breath, cough, headache or other systemic symptoms.  She arrives febrile to 100.4 but vital signs otherwise stable.   Abdominal Pain Associated symptoms: no chest pain, no chills, no cough, no dysuria, no fever, no hematuria, no shortness of breath, no sore throat and no vomiting   Weakness Associated symptoms: abdominal pain   Associated symptoms: no arthralgias, no chest pain, no cough, no dysuria, no fever, no seizures, no shortness of breath and no vomiting   Hip Pain Associated symptoms include abdominal pain. Pertinent negatives include no chest pain and no shortness of breath.      Past Medical History:  Diagnosis Date   Bleeding behind the abdominal cavity 10/2017   Chronic anticoagulation - coumadin, CHADS2VASC=6 05/17/2015   CKD (chronic kidney disease), stage III ([name])    Combined systolic and diastolic heart failure ([name])    Coronary artery disease    a. s/p multiple stents - stenting x 2 to the LAD and x 1 to the LCx in 2000, rotational arthrectomy to proximal LCx and DES to LAD in 2014 and DES to LCx in 2014, along with DES to LAD for re-in-stent stenosis in 2015, and DES to ostial LAD in 2016. b. 07/2016 - orbital atherectomy & DES to mid LCx.   Diverticulitis    Granulosa cell carcinoma ([place])     abd; last episode was in 2009   Hyperlipidemia    Hypertension    LBBB (left bundle branch block)    Myocardial infarction ([place]) 2002   Obesity    OSA on CPAP    Ovarian ca [facility]) 2019   Pacemaker failure    a. Prior leadless PPM with premature battery failure, being managed conservatively without replacement.   Peripheral vascular disease ([place])    Permanent atrial fibrillation ([place]) 2013   Renal artery stenosis ([name])    Tachycardia-bradycardia syndrome ([place])    a. s/p leadless pacemaker ([name]) implanted by Dr [name]   Varicose veins     Patient Active Problem List   Diagnosis Date Noted   Metastatic cancer ([place]) 09/13/2020   Coagulation defect ([place]) 03/20/2020   Under care of palliative care physician    ACS (acute coronary syndrome) ([place]) 06/09/2019   Heart failure ([place]) 05/29/2019   Atrial fibrillation, chronic ([place]) 05/28/2019   Stable angina ([place]) 10/09/2018   Coagulopathy ([place]) 10/09/2018   Chronic combined systolic (congestive) and diastolic (congestive) heart failure ([place]) 10/08/2018   Peripheral neuropathy due to chemotherapy ([place]) 11/19/2017   Pancytopenia, acquired ([place]) 11/19/2017   Acute on chronic combined systolic (congestive) and diastolic (congestive) heart failure ([place]) 11/03/2017   Hyperglycemia 11/03/2017   Protein-calorie malnutrition, moderate ([place]) 10/28/2017   Other constipation 10/20/2017   Postherpetic neuralgia 10/20/2017   Cancer associated pain  10/20/2017   Physical debility 10/20/2017   Goals of care, counseling/discussion 10/20/2017   Ovarian cancer ([place]) 10/19/2017   Intraperitoneal hemorrhage    Abdominal pain 10/12/2017   Pain of joint of left ankle and foot 08/03/2017   Persistent atrial fibrillation    Warfarin anticoagulation    DOE (dyspnea on exertion)    Status post coronary artery stent placement    Normocytic anemia 07/11/2016   History of small bowel obstruction 12/22/2015   Permanent atrial fibrillation with RVR  12/15/2015   Demand ischemia ([place]) 12/15/2015   NSTEMI (non-ST elevated myocardial infarction) ([place]) 05/18/2015   Chronic anticoagulation - coumadin, CHADS2VASC=6 05/17/2015   Unstable angina ([place]) 05/17/2015   Swelling of lower extremity 04/18/2015   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[place]) 06/16/2014   Ischemic chest pain ([name])    OSA on CPAP 05/14/2014   Chest pain 01/05/2014   Atrial flutter ([place])    Hypokalemia    Pacemaker - St Jude Leadless PPM    Combined systolic and diastolic heart failure, NYHA class 3 ([name])    Tachycardia-bradycardia syndrome ([place]) 09/30/2012   Atrial fibrillation with RVR ([place]) 12/28/2011   Ventral hernia 10/16/2011   Granulosa cell tumor of ovary 04/23/2011   CAD (coronary artery disease) 10/03/2010   Essential hypertension 10/03/2010   Hyperlipidemia 10/03/2010   Renal artery stenosis ([place]) 10/03/2010   Obesity (BMI 30.0-34.9) 10/03/2010    Past Surgical History:  Procedure Laterality Date   ABDOMINAL HYSTERECTOMY     CARDIAC CATHETERIZATION  09/03/2007   EF 70%; Failed attempt at PCI to OM   CARDIAC CATHETERIZATION  11/01/2003   EF 70%   CARDIAC CATHETERIZATION N/A 12/14/2015   Procedure: Left Heart Cath and Coronary Angiography;  Surgeon: Burnell Blanks, MD;  Location: [place] CV LAB;  Service: Cardiovascular;  Laterality: N/A;   CARDIOVERSION  12/31/2011   Procedure: CARDIOVERSION;  Surgeon: [name], MD;  Location: [facility] ENDOSCOPY;  Service: Cardiovascular;  Laterality: N/A;   CARDIOVERSION N/A 12/31/2011   Procedure: CARDIOVERSION;  Surgeon: [name], MD;  Location: [facility] CATH LAB;  Service: Cardiovascular;  Laterality: N/A;   CARDIOVERSION N/A 11/15/2018   Procedure: CARDIOVERSION;  Surgeon: Dorothy Spark, MD;  Location: [facility] [place] ENDOSCOPY;  Service: Cardiovascular;  Laterality: N/A;   CARDIOVERSION N/A 06/10/2019   Procedure: CARDIOVERSION;  Surgeon: Fay Records, MD;  Location: [facility] ENDOSCOPY;  Service:  Cardiovascular;  Laterality: N/A;   CARDIOVERSION N/A 06/14/2019   Procedure: CARDIOVERSION;  Surgeon: Pixie Casino, MD;  Location: [facility] ENDOSCOPY;  Service: Cardiovascular;  Laterality: N/A;   CATARACT EXTRACTION, BILATERAL  2015   CHOLECYSTECTOMY  1980's   COLON SURGERY  2004   colectomy for diverticulosis   CORONARY ANGIOPLASTY WITH STENT PLACEMENT  2000    and 08/11/2012; 11/12/2012: 3 + 2 LAD & CFX; 2nd CFX stent 11/12/2012   CORONARY ATHERECTOMY N/A 07/15/2016   Procedure: Coronary Atherectomy;  Surgeon: Martinique, Peter M, MD;  Location: [place] CV LAB;  Service: Cardiovascular;  Laterality: N/A;   CORONARY BALLOON ANGIOPLASTY N/A 07/14/2016   Procedure: Coronary Balloon Angioplasty;  Surgeon: Martinique, Peter M, MD;  Location: [place] CV LAB;  Service: Cardiovascular;  Laterality: N/A;   CORONARY STENT INTERVENTION N/A 07/15/2016   Procedure: Coronary Stent Intervention;  Surgeon: Martinique, Peter M, MD;  Location: [place] CV LAB;  Service: Cardiovascular;  Laterality: N/A;   FRACTIONAL FLOW RESERVE WIRE  10/07/2013   Procedure: FRACTIONAL FLOW RESERVE WIRE;  Surgeon: Conception Oms  Hassell Done, MD;  Location: [facility] CATH LAB;  Service: Cardiovascular;;   HERNIA REPAIR  2005   "laparoscopic"   IR IMAGING GUIDED PORT INSERTION  10/28/2017   LEFT HEART CATH AND CORONARY ANGIOGRAPHY N/A 07/14/2016   Procedure: Left Heart Cath and Coronary Angiography;  Surgeon: Martinique, Peter M, MD;  Location: [place] CV LAB;  Service: Cardiovascular;  Laterality: N/A;   LEFT HEART CATH AND CORONARY ANGIOGRAPHY N/A 11/10/2018   Procedure: LEFT HEART CATH AND CORONARY ANGIOGRAPHY;  Surgeon: Wellington [place], MD;  Location: [place] CV LAB;  Service: Cardiovascular;  Laterality: N/A;   LEFT HEART CATHETERIZATION WITH CORONARY ANGIOGRAM N/A 11/12/2012   Procedure: LEFT HEART CATHETERIZATION WITH CORONARY ANGIOGRAM;  Surgeon: [name], MD;  Location[facility] C[facility] LAB;  Service: Cardiovascular;  Laterality: N/A;    LEFT HEART CATHETERIZATION WITH CORONARY ANGIOGRAM N/A 10/07/2013   Procedure: LEFT HEART CATHETERIZATION WITH CORONARY ANGIOGRAM;  Surgeon: [name], MD;  Location: [facility] CATH LAB;  Service: Cardiovascular;  Laterality: N/A;   LEFT HEART CATHETERIZATION WITH CORONARY ANGIOGRAM N/A 12/14/2013   Procedure: LEFT HEART CATHETERIZATION WITH CORONARY ANGIOGRAM;  Surgeon: Sinclair Grooms, MD;  Location[facility] CATH LAB;  Service: Cardiovascular;  Laterality: N/A;   LEFT HEART CATHETERIZATION WITH CORONARY ANGIOGRAM N/A 05/16/2014   Procedure: LEFT HEART CATHETERIZATION WITH CORONARY ANGIOGRAM;  Surgeon: Sherren Mocha, MD;  Location: [facility] CATH LAB;  Service: Cardiovascular;  Laterality: N/A;   PERCUTANEOUS CORONARY INTERVENTION-BALLOON ONLY  08/04/2012   Procedure: PERCUTANEOUS CORONARY INTERVENTION-BALLOON ONLY;  Surgeon: [name], MD;  Location: [facility] CATH LAB;  Service: Cardiovascular;;   PERCUTANEOUS CORONARY ROTOBLATOR INTERVENTION (PCI-R) N/A 08/11/2012   Procedure: PERCUTANEOUS CORONARY ROTOBLATOR INTERVENTION (PCI-R);  Surgeon: [name], MD;  Location: [facility] CATH LAB;  Service: Cardiovascular;  Laterality: N/A;   PERCUTANEOUS CORONARY STENT INTERVENTION (PCI-S)  10/07/2013   Procedure: PERCUTANEOUS CORONARY STENT INTERVENTION (PCI-S);  Surgeon: [name], MD;  Location: [facility] CATH LAB;  Service: Cardiovascular;;   PERMANENT PACEMAKER INSERTION N/A 03/16/2012   [name] (SJM) leadless pacemaker (LEADLESS II STUDY PATEINT)   SALIVARY GLAND SURGERY  2000's   "had a little lump removed; granulosa related; it was benign" (08/11/2012)   TEE WITHOUT CARDIOVERSION  12/31/2011   Procedure: TRANSESOPHAGEAL ECHOCARDIOGRAM (TEE);  Surgeon: [name], MD;  Location: [facility] ENDOSCOPY;  Service: Cardiovascular;  Laterality: N/A;   UMBILICAL GRANULOMA EXCISION  2000   2003; 2004; 2007: "all in my abdomen including small intestines, outside my ?uterus/etc" (08/11/2012)   VARICOSE VEIN  SURGERY Bilateral 1977     OB History   No obstetric history on file.     Family History  Problem Relation Age of Onset   Stroke Mother    Heart attack Father    Diabetes Father    Hypertension Father    Heart attack Brother    Diabetes Brother    Hypertension Brother    Kidney failure Brother     Social History   Tobacco Use   Smoking status: Former    Packs/day: 1.00    Years: 32.00    Pack years: 32.00    Types: Cigarettes    Quit date: 02/03/1974    Years since quitting: 46.6   Smokeless tobacco: Never  Vaping Use   Vaping Use: Never used  Substance Use Topics   Alcohol use: Yes    Alcohol/week: 7.0 standard drinks    Types: 7 Glasses of wine per week    Comment: one  glass wine nightly with dinner   Drug use: No    Home Medications Prior to Admission medications   Medication Sig Start Date End Date Taking? Authorizing Provider  acetaminophen (TYLENOL) 500 MG tablet Take 500 mg by mouth every 6 (six) hours as needed for mild pain or headache.     [provider]  amLODipine (NORVASC) 5 MG tablet Take 1 tablet (5 mg total) by mouth 2 (two) times daily. 02/27/20   [name], MD  beta carotene w/minerals (OCUVITE) tablet Take 1 tablet by mouth 2 (two) times daily.     [provider]  clopidogrel (PLAVIX) 75 MG tablet TAKE 1 TABLET BY MOUTH EVERY DAY Patient taking differently: Take 75 mg by mouth daily. 02/27/20   [name], MD  Coenzyme Q-10 100 MG capsule Take 200 mg by mouth daily.     [provider]  digoxin (LANOXIN) 0.125 MG tablet Take 0.5 tablets (0.0625 mg total) by mouth daily. 07/11/20   [name], MD  dofetilide [facility]) 125 MCG capsule TAKE 1 CAPSULE BY MOUTH TWICE A DAY 09/03/20   [name], MD  furosemide (LASIX) 40 MG tablet TAKE 2 TABLETS BY MOUTH EVERY MORNING AND 2 TABLETS AS NEEDED FOR WEIGHT GAIN/ABDOMINAL DISTENSION 07/04/20   [name], MD  gabapentin (NEURONTIN) 300  MG capsule Take 1 capsule (300 mg total) by mouth 2 (two) times daily. 10/12/19   Heath Lark, MD  isosorbide mononitrate (IMDUR) 60 MG 24 hr tablet TAKE 1.5 TABLETS BY MOUTH DAILY 08/23/20   [name], MD  MAG-200 200 MG TABS SMARTSIG:2 Tablet(s) By Mouth Daily 07/09/20   [provider]  magnesium oxide (MAG-OX) 400 MG tablet Take 1 tablet (400 mg total) by mouth daily. 07/09/20   Sherran Needs, NP  metoprolol (TOPROL-XL) 200 MG 24 hr tablet TAKE 1 TABLET DAILY WITH OR IMMEDIATELY FOLLOWING A MEAL. 12/12/19   [name], MD  Multiple Vitamin (MULTIVITAMIN) capsule Take 1 capsule by mouth daily.    [provider]  nitroGLYCERIN (NITROSTAT) 0.4 MG SL tablet PLACE 1 TABLET (0.4 MG TOTAL) UNDER THE TONGUE EVERY 5 (FIVE) MINUTES AS NEEDED FOR CHEST PAIN 01/09/20   Sherran Needs, NP  potassium chloride SA (KLOR-CON) 20 MEQ tablet Take 2 tablets (40 mEq total) by mouth 2 (two) times daily. 05/17/20   Allred, Jeneen Rinks, MD  rosuvastatin (CRESTOR) 40 MG tablet Take 1 tablet (40 mg total) by mouth daily. 12/06/19   [name], MD  XARELTO 15 MG TABS tablet TAKE 1 TABLET BY MOUTH DAILY WITH SUPPER Patient taking differently: Take 15 mg by mouth daily with supper. 05/30/20   [name], MD    Allergies    Bee venom, Other, Oxycodone hcl, Amiodarone, Chlorhexidine, Oxycodone-acetaminophen, and Prednisone  Review of Systems   Review of Systems  Constitutional:  Negative for chills and fever.  HENT:  Negative for ear pain and sore throat.   Eyes:  Negative for pain and visual disturbance.  Respiratory:  Negative for cough and shortness of breath.   Cardiovascular:  Negative for chest pain and palpitations.  Gastrointestinal:  Positive for abdominal pain. Negative for vomiting.  Genitourinary:  Negative for dysuria and hematuria.  Musculoskeletal:  Negative for arthralgias and back pain.       Left hip pain  Skin:  Negative for color change and rash.   Neurological:  Positive for weakness. Negative for seizures and syncope.  All other  systems reviewed and are negative.  Physical Exam Updated Vital Signs BP (!) 165/48 (BP Location: Left Arm)   Pulse 60   Temp 98.4 F (36.9 C) (Oral)   Resp 17   SpO2 96%   Physical Exam Vitals and nursing note reviewed.  Constitutional:      General: She is not in acute distress.    Appearance: She is well-developed.  HENT:     Head: Normocephalic and atraumatic.  Eyes:     Conjunctiva/sclera: Conjunctivae normal.  Cardiovascular:     Rate and Rhythm: Normal rate and regular rhythm.     Heart sounds: No murmur heard. Pulmonary:     Effort: Pulmonary effort is normal. No respiratory distress.     Breath sounds: Normal breath sounds.  Abdominal:     Palpations: Abdomen is soft. There is mass (Left lower quadrant abdominal wall).     Tenderness: There is abdominal tenderness in the left lower quadrant.  Musculoskeletal:     Cervical back: Neck supple.  Skin:    General: Skin is warm and dry.  Neurological:     Mental Status: She is alert.    ED Results / Procedures / Treatments   Labs (all labs ordered are listed, but only abnormal results are displayed) Labs Reviewed  CBC WITH DIFFERENTIAL/PLATELET - Abnormal; Notable for the following components:      Result Value   RBC 3.80 (*)    All other components within normal limits  COMPREHENSIVE METABOLIC PANEL - Abnormal; Notable for the following components:   Calcium 8.8 (*)    Albumin 3.1 (*)    All other components within normal limits  URINALYSIS, ROUTINE W REFLEX MICROSCOPIC - Abnormal; Notable for the following components:   Specific Gravity, Urine 1.032 (*)    Ketones, ur 20 (*)    All other components within normal limits  SEDIMENTATION RATE - Abnormal; Notable for the following components:   Sed Rate 96 (*)    All other components within normal limits  C-REACTIVE PROTEIN - Abnormal; Notable for the following components:    CRP 3.7 (*)    All other components within normal limits  RESP PANEL BY RT-PCR (FLU A&B, COVID) [id number]  CULTURE, BLOOD (SINGLE)  URINE CULTURE  LIPASE, BLOOD  LACTIC ACID, PLASMA  PROTIME-INR  APTT    EKG EKG Interpretation  Date/Time:  Thursday September 13 2020 10:15:34 EDT Ventricular Rate:  60 PR Interval:    QRS Duration: 169 QT Interval:  479 QTC Calculation: 479 R Axis:   -86 Text Interpretation: Ventricular paced rhythm, fragmented QRS, similar to previous, negative Sgarbossa criteria Confirmed by [place] ([id number]) on 09/13/2020 10:53:51 AM  Radiology CT HIP LEFT W CONTRAST  Result Date: 09/13/2020 CLINICAL DATA:  Septic arthritis suspected, hip, no prior imaging L hip pain, ovarian ca, concern for mets vs septic joint, fever EXAM: CT OF THE LOWER LEFT EXTREMITY WITH CONTRAST TECHNIQUE: Multidetector CT imaging of the lower left extremity was performed according to the standard protocol following intravenous contrast administration. CONTRAST:  69m OMNIPAQUE IOHEXOL 350 MG/ML SOLN COMPARISON:  CT 05/16/2020 FINDINGS: Bones/Joint/Cartilage No acute fracture or dislocation of the left hip. The visualized portion of the left hemipelvis is intact without evidence of fracture or diastasis. No lytic or sclerotic bone lesion is identified. Mild osteoarthritis of the left hip as manifested by joint space narrowing, subchondral sclerosis/cystic change, and marginal osteophyte formation. No appreciable left hip joint effusion. Ligaments Suboptimally assessed by CT. Muscles and Tendons No  acute musculotendinous abnormality by CT. Soft tissues No soft tissue edema or fluid collection. Vascular calcifications are present. Large left ovarian mass is seen within the pelvis. IMPRESSION: 1. No acute osseous abnormality of the left hip. No visible joint effusion or evidence to suggest septic arthritis by CT. 2. Mild osteoarthritis of the left hip. 3. Large left ovarian mass is seen within the pelvis.  Please refer to dedicated same day CT abdomen-pelvis for detailed characterization of the intrapelvic findings. Electronically Signed   By: Davina Poke D.O.   On: 09/13/2020 11:39   CT ABDOMEN PELVIS W CONTRAST  Result Date: 09/13/2020 CLINICAL DATA:  Left lower quadrant abdominal pain and fever. EXAM: CT ABDOMEN AND PELVIS WITH CONTRAST TECHNIQUE: Multidetector CT imaging of the abdomen and pelvis was performed using the standard protocol following bolus administration of intravenous contrast. CONTRAST:  67m OMNIPAQUE IOHEXOL 350 MG/ML SOLN COMPARISON:  05/16/2020 FINDINGS: Lower chest: 2.1 cm nodule in the anterior right lower lobe was 1.9 cm previously. Several additional nodules are seen in the right middle and lower lobe. Hepatobiliary: Small subcapsular lesion posterior right liver with dystrophic calcification likely secondary to prior infection. Trace intrahepatic biliary duct dilatation is new in the interval. Gallbladder surgically absent. Common bile duct diameter upper normal in the head of pancreas. Pancreas: Mild prominence of the main pancreatic duct in the head of pancreas. Pancreatic duct in the body and tail of pancreas is nondilated. No discrete pancreatic mass lesion evident. Spleen: No splenomegaly. No focal mass lesion. Adrenals/Urinary Tract: No adrenal nodule or mass. Left kidney is atrophic. Areas of focal cortical scarring noted right kidney. No evidence for hydroureter. The urinary bladder is nondistended and displaced to the right due to the left pelvic mass described below. Stomach/Bowel: Tiny hiatal hernia. Stomach otherwise unremarkable. Duodenum is normally positioned as is the ligament of Treitz. No small bowel wall thickening. No small bowel dilatation. The terminal ileum is normal. The appendix is normal. No gross colonic mass. No colonic wall thickening. Diverticular changes are noted in the left colon without evidence of diverticulitis. Vascular/Lymphatic: There is  advanced atherosclerotic calcification of the abdominal aorta without aneurysm. There is no gastrohepatic or hepatoduodenal ligament lymphadenopathy. No retroperitoneal or mesenteric lymphadenopathy. No pelvic sidewall lymphadenopathy. Reproductive: Uterus surgically absent. Since the prior exam, marked progression of the left lower quadrant mass lesion, now measuring 11.0 x 9.7 cm compared to 9.5 x 6.9 cm previously. Lesion extends anteriorly to the left rectus sheath where there is new heterogeneous soft tissue thickening of the left rectus sheath, potentially related to direct tumor extension although associated component of rectus sheath hematoma could have this appearance. No right adnexal mass. Other: 1.3 x 1.1 cm midline omental nodule measured previously is 2.7 x 2.1 cm on image 50/2 today. Nodule on 44/7 was 2.8 x 2.7 cm central mesenteric 2.9 x 2.6 cm previously (remeasured). No intraperitoneal free fluid. Musculoskeletal: Evidence for prior ventral mesh placement. Small midline ventral hernia identified at the inferior margin of the mesh containing small bowel without complicating features. IMPRESSION: 1. Marked interval progression of the left lower quadrant mass lesion, now measuring up to 11.0 x 9.7 cm compared to 9.5 x 6.9 cm previously. Lesion extends anteriorly to and apparently into the left rectus sheath where there is new heterogeneous soft tissue thickening/expansion of the left rectus sheath, potentially related to direct tumor extension although associated component of rectus sheath hematoma could have this appearance. 2. Interval increase in size of the midline omental nodule  with similar appearance of the central mesenteric soft tissue nodule, both concerning for metastatic involvement. 3. Interval progression of pulmonary nodules in the right lung base consistent with metastatic disease. 4. New trace intrahepatic biliary duct dilatation with upper normal common bile duct diameter.  Correlation with liver function test may prove helpful. 5. Left colonic diverticulosis without diverticulitis. 6. Small midline ventral hernia at the inferior margin of the mesh containing short segment small bowel without complicating features. 7. Aortic Atherosclerosis (ICD10-I70.0). Electronically Signed   By: Misty Stanley M.D.   On: 09/13/2020 12:12   DG Chest Port 1 View  Result Date: 09/13/2020 CLINICAL DATA:  Questionable sepsis EXAM: PORTABLE CHEST 1 VIEW COMPARISON:  Chest radiograph 06/27/2019 FINDINGS: A left chest wall cardiac device and associated leads and right chest wall port are in stable position. The port tip terminates in the mid to upper SVC. A loop recorder is again seen projecting over the heart. The heart is mildly enlarged, unchanged. The mediastinal contours are otherwise within normal limits. There is calcified atherosclerotic plaque of the aortic arch. There is patchy opacity in the right base with a focal nodular component likely corresponding to the known right lower lobe metastatic lesion. Patchy retrocardiac opacities are also noted. There may be a trace left pleural effusion. There is no pneumothorax. There is marked degenerative change of the right shoulder. There is no acute osseous abnormality. IMPRESSION: 1. Nodular opacity in the right base likely corresponds to the known metastatic lesion. Superimposed infection would be difficult to exclude. 2. Patchy retrocardiac opacities may reflect atelectasis or infection. 3. Trace left pleural effusion. 4. Unchanged cardiomegaly. Electronically Signed   By: Valetta Mole MD   On: 09/13/2020 10:22    Procedures Procedures   Medications Ordered in ED Medications  lactated ringers bolus 1,000 mL (0 mLs Intravenous Stopped 09/13/20 1050)  morphine 4 MG/ML injection 4 mg (4 mg Intravenous Given 09/13/20 1016)  ondansetron (ZOFRAN) injection 4 mg (4 mg Intravenous Given 09/13/20 1016)  iohexol (OMNIPAQUE) 350 MG/ML injection 75 mL  (75 mLs Intravenous Contrast Given 09/13/20 1119)    ED Course  I have reviewed the triage vital signs and the nursing notes.  Pertinent labs & imaging results that were available during my care of the patient were reviewed by me and considered in my medical decision making (see chart for details).    MDM Rules/Calculators/A&P                           Patient seen the emergency department for evaluation of abdominal pain and hip pain.  Physical exam reveals a palpable mass in left lower quadrant that is tender to palpation and tenderness over the left hip.  Laboratory evaluation is largely unremarkable.  CT imaging reveals a significant worsening of the patient's underlying ovarian cancer but no evidence of metastasis to the hip.  I spoke with Gyn Onc who stated the patient is no longer a surgical or chemotherapeutic candidate.  I had a discussion with the patient who is requesting admission to the hospital in order to coordinate care with home health hospice and for physical therapy evaluation.  Patient will be admitted to the medical service  Final Clinical Impression(s) / ED Diagnoses Final diagnoses:  None    Rx / DC Orders ED Discharge Orders     None        Latravious Levitt, Debe Coder, MD 09/13/20 1720

## 2020-09-13 NOTE — ED Notes (Signed)
CT transported pt back to room. CT informed this RN that pt was dizzy and nauseous. This RN and Clarise Cruz, RN found pt in room with sats at 89% RA. Pt placed on 2L [place]. Sats 94%. MD aware.

## 2020-09-13 NOTE — Progress Notes (Signed)
   Pt is enrolled in [facility], the home-based palliative care division of [facility].  She receives home visits for support by our SN and SW.  Will plan to resume [facility] services upon d/c back home.  Please contact [facility] if we can be of assistance with the d/c plan.  Thank you Wynetta Fines, RN  Office [phone] Mobile [phone]

## 2020-09-13 NOTE — ED Notes (Signed)
Crackers and ginger ale provided to patient

## 2020-09-13 NOTE — Plan of Care (Signed)
  Problem: Clinical Measurements: Goal: Respiratory complications will improve Outcome: Progressing   Problem: Clinical Measurements: Goal: Cardiovascular complication will be avoided Outcome: Progressing   Problem: Pain Managment: Goal: General experience of comfort will improve Outcome: Progressing   Problem: Nutrition: Goal: Adequate nutrition will be maintained Outcome: Progressing   Problem: Skin Integrity: Goal: Risk for impaired skin integrity will decrease Outcome: Progressing

## 2020-09-13 NOTE — H&P (Addendum)
ADMISSION HISTORY AND PHYSICAL   Patty [name] [id number] DOB: December 10, 1930 DOA: 09/13/2020  PCP: [name], MD Patient coming from: home via ER  Chief Complaint: hip pain   HPI:  87 year old female with a history of ovarian cancer, CAD, atrial fibrillation, PVD/RAS, and pacemaker placement who presented to the ER with complaints of severe left lower quadrant abdominal and left hip pain x1 week.  In the ER physical exam revealed a palpable mass in the left lower quadrant and the patient was quite tender to palpation over the left hip.  CT imaging unfortunately noted significant worsening of the patient's underlying ovarian cancer felt to be the cause of her worsening pain.  Her care was discussed with her GYN Oncologist who reported that she was no longer a candidate for surgical or other therapeutic or even palliative interventions.  The intention is to admit the patient for pain control and transition to comfort focused care at time of discharge.  Assessment/Plan  Metastatic progressive ovarian cancer with severe acute pain left lower quadrant/left hip Titrate pain medication to comfort -established routine that can be provided at home -hospice consultation/home hospice evaluation  CAD Asymptomatic  Chronic atrial fibrillation Rate controlled at this time  HTN Blood pressure stable  DVT prophylaxis: Lovenox Code Status: DNR  Family Communication: No family present at admission Disposition Plan:  Admit to Inpatient  Consults called: none indicated  Review of Systems: As per HPI otherwise 10 point review of systems negative.   Past Medical History:  Diagnosis Date   Bleeding behind the abdominal cavity 10/2017   Chronic anticoagulation - coumadin, CHADS2VASC=6 05/17/2015   CKD (chronic kidney disease), stage III ([name])    Combined systolic and diastolic heart failure ([name])    Coronary artery disease    a. s/p multiple stents - stenting x 2 to the LAD and x 1 to the  LCx in 2000, rotational arthrectomy to proximal LCx and DES to LAD in 2014 and DES to LCx in 2014, along with DES to LAD for re-in-stent stenosis in 2015, and DES to ostial LAD in 2016. b. 07/2016 - orbital atherectomy & DES to mid LCx.   Diverticulitis    Granulosa cell carcinoma ([place])    abd; last episode was in 2009   Hyperlipidemia    Hypertension    LBBB (left bundle branch block)    Myocardial infarction ([place]) 2002   Obesity    OSA on CPAP    Ovarian ca [facility]) 2019   Pacemaker failure    a. Prior leadless PPM with premature battery failure, being managed conservatively without replacement.   Peripheral vascular disease ([place])    Permanent atrial fibrillation ([place]) 2013   Renal artery stenosis ([name])    Tachycardia-bradycardia syndrome ([place])    a. s/p leadless pacemaker ([name]) implanted by Dr [name]   Varicose veins     Past Surgical History:  Procedure Laterality Date   ABDOMINAL HYSTERECTOMY     CARDIAC CATHETERIZATION  09/03/2007   EF 70%; Failed attempt at PCI to OM   CARDIAC CATHETERIZATION  11/01/2003   EF 70%   CARDIAC CATHETERIZATION N/A 12/14/2015   Procedure: Left Heart Cath and Coronary Angiography;  Surgeon: Burnell Blanks, MD;  Location: [facility] CV LAB;  Service: Cardiovascular;  Laterality: N/A;   CARDIOVERSION  12/31/2011   Procedure: CARDIOVERSION;  Surgeon: [name], MD;  Location: [facility] ENDOSCOPY;  Service: Cardiovascular;  Laterality: N/A;   CARDIOVERSION N/A 12/31/2011   Procedure: CARDIOVERSION;  Surgeon: [name], MD;  Location: [facility] CATH LAB;  Service: Cardiovascular;  Laterality: N/A;   CARDIOVERSION N/A 11/15/2018   Procedure: CARDIOVERSION;  Surgeon: Dorothy Spark, MD;  Location: [facility] ENDOSCOPY;  Service: Cardiovascular;  Laterality: N/A;   CARDIOVERSION N/A 06/10/2019   Procedure: CARDIOVERSION;  Surgeon: Fay Records, MD;  Location: [facility] ENDOSCOPY;  Service: Cardiovascular;  Laterality: N/A;   CARDIOVERSION N/A  06/14/2019   Procedure: CARDIOVERSION;  Surgeon: Pixie Casino, MD;  Location: [facility] ENDOSCOPY;  Service: Cardiovascular;  Laterality: N/A;   CATARACT EXTRACTION, BILATERAL  2015   CHOLECYSTECTOMY  1980's   COLON SURGERY  2004   colectomy for diverticulosis   CORONARY ANGIOPLASTY WITH STENT PLACEMENT  2000    and 08/11/2012; 11/12/2012: 3 + 2 LAD & CFX; 2nd CFX stent 11/12/2012   CORONARY ATHERECTOMY N/A 07/15/2016   Procedure: Coronary Atherectomy;  Surgeon: Martinique, Peter M, MD;  Location: [place] [facility] LAB;  Service: Cardiovascular;  Laterality: N/A;   CORONARY BALLOON ANGIOPLASTY N/A 07/14/2016   Procedure: Coronary Balloon Angioplasty;  Surgeon: Martinique, Peter M, MD;  Location: [place] CV LAB;  Service: Cardiovascular;  Laterality: N/A;   CORONARY STENT INTERVENTION N/A 07/15/2016   Procedure: Coronary Stent Intervention;  Surgeon: Martinique, Peter M, MD;  Location: [place] CV LAB;  Service: Cardiovascular;  Laterality: N/A;   FRACTIONAL FLOW RESERVE WIRE  10/07/2013   Procedure: FRACTIONAL FLOW RESERVE WIRE;  Surgeon: [name], MD;  Location: [facility] CATH LAB;  Service: Cardiovascular;;   HERNIA REPAIR  2005   "laparoscopic"   IR IMAGING GUIDED PORT INSERTION  10/28/2017   LEFT HEART CATH AND CORONARY ANGIOGRAPHY N/A 07/14/2016   Procedure: Left Heart Cath and Coronary Angiography;  Surgeon: Martinique, Peter M, MD;  Location: [place] CV LAB;  Service: Cardiovascular;  Laterality: N/A;   LEFT HEART CATH AND CORONARY ANGIOGRAPHY N/A 11/10/2018   Procedure: LEFT HEART CATH AND CORONARY ANGIOGRAPHY;  Surgeon: [place] Hampshire, MD;  Location: [place] CV LAB;  Service: Cardiovascular;  Laterality: N/A;   LEFT HEART CATHETERIZATION WITH CORONARY ANGIOGRAM N/A 11/12/2012   Procedure: LEFT HEART CATHETERIZATION WITH CORONARY ANGIOGRAM;  Surgeon: [name], MD;  Location: [facility] CATH LAB;  Service: Cardiovascular;  Laterality: N/A;   LEFT HEART CATHETERIZATION WITH CORONARY ANGIOGRAM  N/A 10/07/2013   Procedure: LEFT HEART CATHETERIZATION WITH CORONARY ANGIOGRAM;  Surgeon: [name], MD;  Location: [facility] CATH LAB;  Service: Cardiovascular;  Laterality: N/A;   LEFT HEART CATHETERIZATION WITH CORONARY ANGIOGRAM N/A 12/14/2013   Procedure: LEFT HEART CATHETERIZATION WITH CORONARY ANGIOGRAM;  Surgeon: Sinclair Grooms, MD;  Location: [facility] CATH LAB;  Service: Cardiovascular;  Laterality: N/A;   LEFT HEART CATHETERIZATION WITH CORONARY ANGIOGRAM N/A 05/16/2014   Procedure: LEFT HEART CATHETERIZATION WITH CORONARY ANGIOGRAM;  Surgeon: Sherren Mocha, MD;  Location: [facility] CATH LAB;  Service: Cardiovascular;  Laterality: N/A;   PERCUTANEOUS CORONARY INTERVENTION-BALLOON ONLY  08/04/2012   Procedure: PERCUTANEOUS CORONARY INTERVENTION-BALLOON ONLY;  Surgeon: [name], MD;  Location: [facility] CATH LAB;  Service: Cardiovascular;;   PERCUTANEOUS CORONARY ROTOBLATOR INTERVENTION (PCI-R) N/A 08/11/2012   Procedure: PERCUTANEOUS CORONARY ROTOBLATOR INTERVENTION (PCI-R);  Surgeon: [name], MD;  Location: [facility] CATH LAB;  Service: Cardiovascular;  Laterality: N/A;   PERCUTANEOUS CORONARY STENT INTERVENTION (PCI-S)  10/07/2013   Procedure: PERCUTANEOUS CORONARY STENT INTERVENTION (PCI-S);  Surgeon: [name], MD;  Location: [facility] CATH LAB;  Service: Cardiovascular;;   PERMANENT PACEMAKER INSERTION N/A 03/16/2012  [name] (SJM) leadless pacemaker (LEADLESS II STUDY PATEINT)   SALIVARY GLAND SURGERY  2000's   "had a little lump removed; granulosa related; it was benign" (08/11/2012)   TEE WITHOUT CARDIOVERSION  12/31/2011   Procedure: TRANSESOPHAGEAL ECHOCARDIOGRAM (TEE);  Surgeon: [name], MD;  Location: [facility] ENDOSCOPY;  Service: Cardiovascular;  Laterality: N/A;   UMBILICAL GRANULOMA EXCISION  2000   2003; 2004; 2007: "all in my abdomen including small intestines, outside my ?uterus/etc" (08/11/2012)   VARICOSE VEIN SURGERY Bilateral 1977    Family History  Family  History  Problem Relation Age of Onset   Stroke Mother    Heart attack Father    Diabetes Father    Hypertension Father    Heart attack Brother    Diabetes Brother    Hypertension Brother    Kidney failure Brother     Social History   reports that she quit smoking about 46 years ago. Her smoking use included cigarettes. She has a 32.00 pack-year smoking history. She has never used smokeless tobacco. She reports current alcohol use of about 7.0 standard drinks per week. She reports that she does not use drugs.  Allergies Allergies  Allergen Reactions   Bee Venom Anaphylaxis   Other Nausea And Vomiting and Other (See Comments)    Pain medications cause severe vomiting. Tolerated slow IV morphine drip   Oxycodone Hcl Nausea And Vomiting   Amiodarone Nausea Only   Chlorhexidine    Oxycodone-Acetaminophen Other (See Comments)   Prednisone Palpitations and Other (See Comments)    "Rapid Heart Beat"    Prior to Admission medications   Medication Sig Start Date End Date Taking? Authorizing Provider  acetaminophen (TYLENOL) 500 MG tablet Take 500 mg by mouth every 6 (six) hours as needed for mild pain or headache.     [provider]  amLODipine (NORVASC) 5 MG tablet Take 1 tablet (5 mg total) by mouth 2 (two) times daily. 02/27/20   [name], MD  beta carotene w/minerals (OCUVITE) tablet Take 1 tablet by mouth 2 (two) times daily.     [provider]  clopidogrel (PLAVIX) 75 MG tablet TAKE 1 TABLET BY MOUTH EVERY DAY Patient taking differently: Take 75 mg by mouth daily. 02/27/20   [name], MD  Coenzyme Q-10 100 MG capsule Take 200 mg by mouth daily.     [provider]  digoxin (LANOXIN) 0.125 MG tablet Take 0.5 tablets (0.0625 mg total) by mouth daily. 07/11/20   [name], MD  dofetilide [facility]) 125 MCG capsule TAKE 1 CAPSULE BY MOUTH TWICE A DAY 09/03/20   [name], MD  furosemide (LASIX) 40 MG tablet TAKE 2  TABLETS BY MOUTH EVERY MORNING AND 2 TABLETS AS NEEDED FOR WEIGHT GAIN/ABDOMINAL DISTENSION 07/04/20   [name], MD  gabapentin (NEURONTIN) 300 MG capsule Take 1 capsule (300 mg total) by mouth 2 (two) times daily. 10/12/19   Heath Lark, MD  isosorbide mononitrate (IMDUR) 60 MG 24 hr tablet TAKE 1.5 TABLETS BY MOUTH DAILY 08/23/20   [name], MD  MAG-200 200 MG TABS SMARTSIG:2 Tablet(s) By Mouth Daily 07/09/20   [provider]  magnesium oxide (MAG-OX) 400 MG tablet Take 1 tablet (400 mg total) by mouth daily. 07/09/20   Sherran Needs, NP  metoprolol (TOPROL-XL) 200 MG 24 hr tablet TAKE 1 TABLET DAILY WITH OR IMMEDIATELY FOLLOWING A MEAL. 12/12/19   [name], MD  Multiple Vitamin (MULTIVITAMIN) capsule Take  1 capsule by mouth daily.    [provider]  nitroGLYCERIN (NITROSTAT) 0.4 MG SL tablet PLACE 1 TABLET (0.4 MG TOTAL) UNDER THE TONGUE EVERY 5 (FIVE) MINUTES AS NEEDED FOR CHEST PAIN 01/09/20   Sherran Needs, NP  potassium chloride SA (KLOR-CON) 20 MEQ tablet Take 2 tablets (40 mEq total) by mouth 2 (two) times daily. 05/17/20   Allred, Jeneen Rinks, MD  rosuvastatin (CRESTOR) 40 MG tablet Take 1 tablet (40 mg total) by mouth daily. 12/06/19   [name], MD  XARELTO 15 MG TABS tablet TAKE 1 TABLET BY MOUTH DAILY WITH SUPPER Patient taking differently: Take 15 mg by mouth daily with supper. 05/30/20   [name], MD    Physical Exam: Vitals:   09/13/20 1400 09/13/20 1500 09/13/20 1530 09/13/20 1711  BP: (!) 157/40 (!) 150/42 (!) 159/49 (!) 165/48  Pulse: 60 63 87 60  Resp: (!) 21 19 (!) 21 17  Temp:    98.4 F (36.9 C)  TempSrc:    Oral  SpO2: 93% 92% 94% 96%    General: No acute respiratory distress Lungs: Clear to auscultation bilaterally without wheezes or crackles Cardiovascular: Regular rate and rhythm without murmur gallop or rub normal S1 and S2 Abdomen: Nontender, nondistended, soft, bowel sounds positive, no  rebound, no ascites, no appreciable mass Extremities: No significant cyanosis, clubbing, or edema bilateral lower extremities   Labs on Admission:   CBC: Recent Labs  Lab 09/13/20 0954  WBC 8.4  NEUTROABS 6.6  HGB 12.2  HCT 37.2  MCV 97.9  PLT 0000000   Basic Metabolic Panel: Recent Labs  Lab 09/13/20 0954  NA 139  K 3.8  CL 103  CO2 27  GLUCOSE 89  BUN 14  CREATININE 0.85  CALCIUM 8.8*   GFR: CrCl cannot be calculated (Unknown ideal weight.).  Liver Function Tests: Recent Labs  Lab 09/13/20 0954  AST 35  ALT 16  ALKPHOS 80  BILITOT 0.6  PROT 7.3  ALBUMIN 3.1*   Recent Labs  Lab 09/13/20 0954  LIPASE 39    Coagulation Profile: Recent Labs  Lab 09/13/20 0954  INR 1.1    Urine analysis:    Component Value Date/Time   COLORURINE YELLOW 09/13/2020 [place] 09/13/2020 1049   LABSPEC 1.032 (H) 09/13/2020 1049   PHURINE 7.0 09/13/2020 1049   GLUCOSEU NEGATIVE 09/13/2020 1049   HGBUR NEGATIVE 09/13/2020 1049   BILIRUBINUR NEGATIVE 09/13/2020 1049   KETONESUR 20 (A) 09/13/2020 1049   PROTEINUR NEGATIVE 09/13/2020 1049   UROBILINOGEN 0.2 12/11/2013 1119   NITRITE NEGATIVE 09/13/2020 1049   LEUKOCYTESUR NEGATIVE 09/13/2020 1049     Radiological Exams on Admission: CT HIP LEFT W CONTRAST  Result Date: 09/13/2020 CLINICAL DATA:  Septic arthritis suspected, hip, no prior imaging L hip pain, ovarian ca, concern for mets vs septic joint, fever EXAM: CT OF THE LOWER LEFT EXTREMITY WITH CONTRAST TECHNIQUE: Multidetector CT imaging of the lower left extremity was performed according to the standard protocol following intravenous contrast administration. CONTRAST:  107m OMNIPAQUE IOHEXOL 350 MG/ML SOLN COMPARISON:  CT 05/16/2020 FINDINGS: Bones/Joint/Cartilage No acute fracture or dislocation of the left hip. The visualized portion of the left hemipelvis is intact without evidence of fracture or diastasis. No lytic or sclerotic bone lesion is  identified. Mild osteoarthritis of the left hip as manifested by joint space narrowing, subchondral sclerosis/cystic change, and marginal osteophyte formation. No appreciable left hip joint effusion. Ligaments Suboptimally assessed by CT.  Muscles and Tendons No acute musculotendinous abnormality by CT. Soft tissues No soft tissue edema or fluid collection. Vascular calcifications are present. Large left ovarian mass is seen within the pelvis. IMPRESSION: 1. No acute osseous abnormality of the left hip. No visible joint effusion or evidence to suggest septic arthritis by CT. 2. Mild osteoarthritis of the left hip. 3. Large left ovarian mass is seen within the pelvis. Please refer to dedicated same day CT abdomen-pelvis for detailed characterization of the intrapelvic findings. Electronically Signed   By: Davina Poke D.O.   On: 09/13/2020 11:39   CT ABDOMEN PELVIS W CONTRAST  Result Date: 09/13/2020 CLINICAL DATA:  Left lower quadrant abdominal pain and fever. EXAM: CT ABDOMEN AND PELVIS WITH CONTRAST TECHNIQUE: Multidetector CT imaging of the abdomen and pelvis was performed using the standard protocol following bolus administration of intravenous contrast. CONTRAST:  84m OMNIPAQUE IOHEXOL 350 MG/ML SOLN COMPARISON:  05/16/2020 FINDINGS: Lower chest: 2.1 cm nodule in the anterior right lower lobe was 1.9 cm previously. Several additional nodules are seen in the right middle and lower lobe. Hepatobiliary: Small subcapsular lesion posterior right liver with dystrophic calcification likely secondary to prior infection. Trace intrahepatic biliary duct dilatation is new in the interval. Gallbladder surgically absent. Common bile duct diameter upper normal in the head of pancreas. Pancreas: Mild prominence of the main pancreatic duct in the head of pancreas. Pancreatic duct in the body and tail of pancreas is nondilated. No discrete pancreatic mass lesion evident. Spleen: No splenomegaly. No focal mass lesion.  Adrenals/Urinary Tract: No adrenal nodule or mass. Left kidney is atrophic. Areas of focal cortical scarring noted right kidney. No evidence for hydroureter. The urinary bladder is nondistended and displaced to the right due to the left pelvic mass described below. Stomach/Bowel: Tiny hiatal hernia. Stomach otherwise unremarkable. Duodenum is normally positioned as is the ligament of Treitz. No small bowel wall thickening. No small bowel dilatation. The terminal ileum is normal. The appendix is normal. No gross colonic mass. No colonic wall thickening. Diverticular changes are noted in the left colon without evidence of diverticulitis. Vascular/Lymphatic: There is advanced atherosclerotic calcification of the abdominal aorta without aneurysm. There is no gastrohepatic or hepatoduodenal ligament lymphadenopathy. No retroperitoneal or mesenteric lymphadenopathy. No pelvic sidewall lymphadenopathy. Reproductive: Uterus surgically absent. Since the prior exam, marked progression of the left lower quadrant mass lesion, now measuring 11.0 x 9.7 cm compared to 9.5 x 6.9 cm previously. Lesion extends anteriorly to the left rectus sheath where there is new heterogeneous soft tissue thickening of the left rectus sheath, potentially related to direct tumor extension although associated component of rectus sheath hematoma could have this appearance. No right adnexal mass. Other: 1.3 x 1.1 cm midline omental nodule measured previously is 2.7 x 2.1 cm on image 50/2 today. Nodule on 44/7 was 2.8 x 2.7 cm central mesenteric 2.9 x 2.6 cm previously (remeasured). No intraperitoneal free fluid. Musculoskeletal: Evidence for prior ventral mesh placement. Small midline ventral hernia identified at the inferior margin of the mesh containing small bowel without complicating features. IMPRESSION: 1. Marked interval progression of the left lower quadrant mass lesion, now measuring up to 11.0 x 9.7 cm compared to 9.5 x 6.9 cm previously.  Lesion extends anteriorly to and apparently into the left rectus sheath where there is new heterogeneous soft tissue thickening/expansion of the left rectus sheath, potentially related to direct tumor extension although associated component of rectus sheath hematoma could have this appearance. 2. Interval increase in size of  the midline omental nodule with similar appearance of the central mesenteric soft tissue nodule, both concerning for metastatic involvement. 3. Interval progression of pulmonary nodules in the right lung base consistent with metastatic disease. 4. New trace intrahepatic biliary duct dilatation with upper normal common bile duct diameter. Correlation with liver function test may prove helpful. 5. Left colonic diverticulosis without diverticulitis. 6. Small midline ventral hernia at the inferior margin of the mesh containing short segment small bowel without complicating features. 7. Aortic Atherosclerosis (ICD10-I70.0). Electronically Signed   By: Misty Stanley M.D.   On: 09/13/2020 12:12   DG Chest Port 1 View  Result Date: 09/13/2020 CLINICAL DATA:  Questionable sepsis EXAM: PORTABLE CHEST 1 VIEW COMPARISON:  Chest radiograph 06/27/2019 FINDINGS: A left chest wall cardiac device and associated leads and right chest wall port are in stable position. The port tip terminates in the mid to upper SVC. A loop recorder is again seen projecting over the heart. The heart is mildly enlarged, unchanged. The mediastinal contours are otherwise within normal limits. There is calcified atherosclerotic plaque of the aortic arch. There is patchy opacity in the right base with a focal nodular component likely corresponding to the known right lower lobe metastatic lesion. Patchy retrocardiac opacities are also noted. There may be a trace left pleural effusion. There is no pneumothorax. There is marked degenerative change of the right shoulder. There is no acute osseous abnormality. IMPRESSION: 1. Nodular  opacity in the right base likely corresponds to the known metastatic lesion. Superimposed infection would be difficult to exclude. 2. Patchy retrocardiac opacities may reflect atelectasis or infection. 3. Trace left pleural effusion. 4. Unchanged cardiomegaly. Electronically Signed   By: Valetta Mole MD   On: 09/13/2020 10:22     Cherene Altes, MD [facility] Office  [phone] Pager - Text Page per Shea Evans as per below:  On-Call/Text Page:      [email or web address]  If 7PM-7AM, please contact night-coverage [email or web address] 09/13/2020, 6:03 PM

## 2020-09-13 NOTE — ED Triage Notes (Signed)
BIBA per EMS patient arrives from home with c/o LLQ pain for 7 days, weakness increasing over week, and nausea.   Vitals: BP: 157/50  HR: 66 RR: 16 SPO2: 98%

## 2020-09-14 DIAGNOSIS — Z7189 Other specified counseling: Secondary | ICD-10-CM

## 2020-09-14 DIAGNOSIS — Z515 Encounter for palliative care: Secondary | ICD-10-CM

## 2020-09-14 MED ORDER — DIGOXIN 0.0625 MG HALF TABLET
0.0625 mg | ORAL_TABLET | Freq: Every day | ORAL | Status: DC
  Administered 2020-09-14 – 2020-09-17 (×4): 0.0625 mg via ORAL
  Filled 2020-09-14: qty 1
  Filled 2020-09-14: qty 0.5
  Filled 2020-09-14 (×3): qty 1

## 2020-09-14 MED ORDER — METOPROLOL SUCCINATE ER 50 MG PO TB24
200.0000 mg | ORAL_TABLET | Freq: Every day | ORAL | Status: DC
  Administered 2020-09-14 – 2020-09-17 (×4): 200 mg via ORAL
  Filled 2020-09-14 (×4): qty 4

## 2020-09-14 MED ORDER — SODIUM CHLORIDE 0.9 % IV SOLN
12.5000 mg | Freq: Four times a day (QID) | INTRAVENOUS | Status: DC | PRN
  Administered 2020-09-16 – 2020-09-17 (×3): 12.5 mg via INTRAVENOUS
  Filled 2020-09-14 (×3): qty 12.5
  Filled 2020-09-14: qty 0.5

## 2020-09-14 MED ORDER — CLOPIDOGREL BISULFATE 75 MG PO TABS
75.0000 mg | ORAL_TABLET | Freq: Every day | ORAL | Status: DC
  Administered 2020-09-14 – 2020-09-17 (×4): 75 mg via ORAL
  Filled 2020-09-14 (×4): qty 1

## 2020-09-14 MED ORDER — DOFETILIDE 125 MCG PO CAPS
125.0000 ug | ORAL_CAPSULE | Freq: Two times a day (BID) | ORAL | Status: DC
  Administered 2020-09-14 – 2020-09-17 (×7): 125 ug via ORAL
  Filled 2020-09-14 (×7): qty 1

## 2020-09-14 MED ORDER — DIGOXIN 125 MCG PO TABS
0.0625 mg | ORAL_TABLET | Freq: Every day | ORAL | Status: DC
  Filled 2020-09-14: qty 0.5

## 2020-09-14 MED ORDER — MORPHINE SULFATE (PF) 4 MG/ML IV SOLN
4.0000 mg | INTRAVENOUS | Status: DC | PRN
  Administered 2020-09-14 – 2020-09-17 (×9): 4 mg via INTRAVENOUS
  Filled 2020-09-14 (×9): qty 1

## 2020-09-14 MED ORDER — PROMETHAZINE HCL 25 MG RE SUPP: 12.5000 mg | Freq: Four times a day (QID) | RECTAL | Status: DC | PRN

## 2020-09-14 MED ORDER — PROMETHAZINE HCL 25 MG PO TABS
12.5000 mg | ORAL_TABLET | Freq: Four times a day (QID) | ORAL | Status: DC | PRN
  Administered 2020-09-14 – 2020-09-16 (×4): 12.5 mg via ORAL
  Filled 2020-09-14 (×4): qty 1

## 2020-09-14 NOTE — Progress Notes (Addendum)
OT Cancellation Note  Patient Details Name: Patty [name] MRN: [id number] DOB: 1930/05/28   Cancelled Treatment:    Reason Eval/Treat Not Completed: Other (comment) patient was nauseated with nurse providing anti-nausea medication. Patient asked for therapy to come back later. Will check back later as schedule allows.  Jackelyn Poling OTR/L, [place] Acute Rehabilitation Department Office# [phone] Pager# [phone]   [place] 09/14/2020, 8:22 AM

## 2020-09-14 NOTE — Progress Notes (Signed)
PT Cancellation Note  Patient Details Name: Patty [name] MRN: [id number] DOB: December 30, 1930   Cancelled Treatment:    Reason Eval/Treat Not Completed: Patient declined, no reason specified (declining therapies this morning per OT due to nausea)  Will check back as schedule permits.   Antjuan Rothe,KATHrine E 09/14/2020, 8:45 AM Arlyce Dice, DPT Acute Rehabilitation Services Pager: [phone] Office: [phone]

## 2020-09-14 NOTE — Progress Notes (Signed)
Pt would like her port accessed if possible. The port has not been accessed in about a year. RN will pass this on day shift to discuss with md. Pt was able to get oob with one person assist and walker to bedside commode. This was improvement in overall pt mobility. Rn placed orders for physical therapy and occupational therapy evals after discussing with md earlier in shift. Pt will need home equipment for her home to help her transition home as well. Transitions of care order already placed for that need. Pt remains stable. Rn will continue to monitor.

## 2020-09-14 NOTE — Progress Notes (Signed)
[facility] PROGRESS NOTE    Progress Note  Patty [name]  [id number] DOB: Jan 03, 1931 DOA: 09/13/2020 PCP: [name], MD     Brief Narrative:   Patty [name] is an 87 y.o. female past medical history of ovarian cancer CAD atrial fibrillation with a pacemaker in place presents to the ED complaining of severe left lower quadrant abdominal pain and left hip pain for 1 week in the ER exam revealed palpable mass in the left lower quadrant CT scan of the abdomen and pelvis showed worsening of the patient ovarian cancer.  Case discussed with GYN oncology and related she is not a surgical candidate or therapeutic options they recommended hospice.    Assessment/Plan:   Metastatic cancer [facility]): Progression of her ovarian cancer that is causing her acute left lower quadrant abdominal pain. Titrate medications for comfort. The patient and family is aware of her poor prognoses, will get palliative care involved we will increase her morphine started on Phenergan for nausea.  CAD: Continue current home medications. Resume Plavix  Chronic atrial fibrillation: Rate controlled resume dofetilide and metoprolol. Hold on on her Eliquis.  DVT prophylaxis: scd Family Communication:daughter Status is: Inpatient  Remains inpatient appropriate because:Hemodynamically unstable  Dispo: The patient is from: Home              Anticipated d/c is to: Home              Patient currently is not medically stable to d/c.   Difficult to place patient No      Code Status:     Code Status Orders  (From admission, onward)           Start     Ordered   09/13/20 1817  Do not attempt resuscitation (DNR)  Continuous       Question Answer Comment  In the event of cardiac or respiratory ARREST Do not call a "code blue"   In the event of cardiac or respiratory ARREST Do not perform Intubation, CPR, defibrillation or ACLS   In the event of cardiac or respiratory ARREST Use medication  by any route, position, wound care, and other measures to relive pain and suffering. May use oxygen, suction and manual treatment of airway obstruction as needed for comfort.   Comments Okay with NIV, ICU-care, pressors      09/13/20 1816           Code Status History     Date Active Date Inactive Code Status Order ID Comments User Context   06/09/2019 0345 06/15/2019 1803 DNR [id number]  Reubin Milan, MD ED   06/09/2019 0345 06/09/2019 0345 DNR [id number]  Reubin Milan, MD ED   05/28/2019 0401 05/31/2019 1505 DNR [id number]  Chauncey Mann, MD ED   11/11/2018 1532 11/16/2018 1811 DNR [id number]  Shirley Friar, PA-C Inpatient   11/09/2018 1544 11/11/2018 1532 Full Code [id number]  Leanor Kail, [place] ED   10/08/2018 1948 10/09/2018 1735 DNR [id number]  Lonn Georgia, PA-C Inpatient   11/03/2017 2010 11/05/2017 1854 DNR [id number]  Shelbie Proctor, MD ED   10/12/2017 1228 10/17/2017 1746 DNR [id number]  Karmen Bongo, MD ED   07/11/2016 0333 07/16/2016 1706 DNR [id number]  Edwin Dada, MD Inpatient   12/13/2015 1947 12/15/2015 1850 Full Code [id number]  Cheryln Manly, NP Inpatient   05/17/2015 1730 05/19/2015 1930 Full Code [id number]  Reola Mosher Inpatient   05/16/2014 1717 05/17/2014 1524 Full Code [id number]  Sherren Mocha,  MD Inpatient   05/14/2014 0335 05/16/2014 1717 DNR [id number]  Ivor Costa, MD Inpatient   01/05/2014 1142 01/06/2014 2020 Full Code [id number]  Jerline Pain, MD ED   12/14/2013 1144 12/15/2013 1529 Full Code [id number]  Belva Crome, MD Inpatient   12/11/2013 1612 12/14/2013 1144 Full Code [id number]  Reola Mosher Inpatient   10/07/2013 1202 10/08/2013 1416 Full Code [id number]  [name], MD Inpatient   10/04/2013 1732 10/07/2013 1202 Full Code [id number]  Robet Leu Inpatient   12/01/2012 0335 12/02/2012 2010 DNR [id number]  Cletus Gash, MD Inpatient   03/16/2012 1757 03/17/2012 1440 Full Code [id number]  Thompson Grayer, MD Inpatient   03/11/2012 0211 03/16/2012 1757 Full Code [id number]  Horton, Jefferey Pica, RN Inpatient      Advance Directive Documentation    Flowsheet Row Most Recent Value  Type of Advance Directive Out of facility DNR (pink MOST or yellow form)  Pre-existing out of facility DNR order (yellow form or pink MOST form) --  "MOST" Form in Place? --         IV Access:   Peripheral IV   Procedures and diagnostic studies:   CT HIP LEFT W CONTRAST  Result Date: 09/13/2020 CLINICAL DATA:  Septic arthritis suspected, hip, no prior imaging L hip pain, ovarian ca, concern for mets vs septic joint, fever EXAM: CT OF THE LOWER LEFT EXTREMITY WITH CONTRAST TECHNIQUE: Multidetector CT imaging of the lower left extremity was performed according to the standard protocol following intravenous contrast administration. CONTRAST:  19m OMNIPAQUE IOHEXOL 350 MG/ML SOLN COMPARISON:  CT 05/16/2020 FINDINGS: Bones/Joint/Cartilage No acute fracture or dislocation of the left hip. The visualized portion of the left hemipelvis is intact without evidence of fracture or diastasis. No lytic or sclerotic bone lesion is identified. Mild osteoarthritis of the left hip as manifested by joint space narrowing, subchondral sclerosis/cystic change, and marginal osteophyte formation. No appreciable left hip joint effusion. Ligaments Suboptimally assessed by CT. Muscles and Tendons No acute musculotendinous abnormality by CT. Soft tissues No soft tissue edema or fluid collection. Vascular calcifications are present. Large left ovarian mass is seen within the pelvis. IMPRESSION: 1. No acute osseous abnormality of the left hip. No visible joint effusion or evidence to suggest septic arthritis by CT. 2. Mild osteoarthritis of the left hip. 3. Large left ovarian mass is seen within the pelvis. Please refer to dedicated same day CT abdomen-pelvis for detailed characterization of the intrapelvic findings. Electronically Signed   By:  NDavina PokeD.O.   On: 09/13/2020 11:39   CT ABDOMEN PELVIS W CONTRAST  Result Date: 09/13/2020 CLINICAL DATA:  Left lower quadrant abdominal pain and fever. EXAM: CT ABDOMEN AND PELVIS WITH CONTRAST TECHNIQUE: Multidetector CT imaging of the abdomen and pelvis was performed using the standard protocol following bolus administration of intravenous contrast. CONTRAST:  758mOMNIPAQUE IOHEXOL 350 MG/ML SOLN COMPARISON:  05/16/2020 FINDINGS: Lower chest: 2.1 cm nodule in the anterior right lower lobe was 1.9 cm previously. Several additional nodules are seen in the right middle and lower lobe. Hepatobiliary: Small subcapsular lesion posterior right liver with dystrophic calcification likely secondary to prior infection. Trace intrahepatic biliary duct dilatation is new in the interval. Gallbladder surgically absent. Common bile duct diameter upper normal in the head of pancreas. Pancreas: Mild prominence of the main pancreatic duct in the head of pancreas. Pancreatic duct in the body and tail of pancreas is nondilated. No discrete pancreatic mass lesion evident. Spleen: No  splenomegaly. No focal mass lesion. Adrenals/Urinary Tract: No adrenal nodule or mass. Left kidney is atrophic. Areas of focal cortical scarring noted right kidney. No evidence for hydroureter. The urinary bladder is nondistended and displaced to the right due to the left pelvic mass described below. Stomach/Bowel: Tiny hiatal hernia. Stomach otherwise unremarkable. Duodenum is normally positioned as is the ligament of Treitz. No small bowel wall thickening. No small bowel dilatation. The terminal ileum is normal. The appendix is normal. No gross colonic mass. No colonic wall thickening. Diverticular changes are noted in the left colon without evidence of diverticulitis. Vascular/Lymphatic: There is advanced atherosclerotic calcification of the abdominal aorta without aneurysm. There is no gastrohepatic or hepatoduodenal ligament  lymphadenopathy. No retroperitoneal or mesenteric lymphadenopathy. No pelvic sidewall lymphadenopathy. Reproductive: Uterus surgically absent. Since the prior exam, marked progression of the left lower quadrant mass lesion, now measuring 11.0 x 9.7 cm compared to 9.5 x 6.9 cm previously. Lesion extends anteriorly to the left rectus sheath where there is new heterogeneous soft tissue thickening of the left rectus sheath, potentially related to direct tumor extension although associated component of rectus sheath hematoma could have this appearance. No right adnexal mass. Other: 1.3 x 1.1 cm midline omental nodule measured previously is 2.7 x 2.1 cm on image 50/2 today. Nodule on 44/7 was 2.8 x 2.7 cm central mesenteric 2.9 x 2.6 cm previously (remeasured). No intraperitoneal free fluid. Musculoskeletal: Evidence for prior ventral mesh placement. Small midline ventral hernia identified at the inferior margin of the mesh containing small bowel without complicating features. IMPRESSION: 1. Marked interval progression of the left lower quadrant mass lesion, now measuring up to 11.0 x 9.7 cm compared to 9.5 x 6.9 cm previously. Lesion extends anteriorly to and apparently into the left rectus sheath where there is new heterogeneous soft tissue thickening/expansion of the left rectus sheath, potentially related to direct tumor extension although associated component of rectus sheath hematoma could have this appearance. 2. Interval increase in size of the midline omental nodule with similar appearance of the central mesenteric soft tissue nodule, both concerning for metastatic involvement. 3. Interval progression of pulmonary nodules in the right lung base consistent with metastatic disease. 4. New trace intrahepatic biliary duct dilatation with upper normal common bile duct diameter. Correlation with liver function test may prove helpful. 5. Left colonic diverticulosis without diverticulitis. 6. Small midline ventral hernia  at the inferior margin of the mesh containing short segment small bowel without complicating features. 7. Aortic Atherosclerosis (ICD10-I70.0). Electronically Signed   By: Misty Stanley M.D.   On: 09/13/2020 12:12   DG Chest Port 1 View  Result Date: 09/13/2020 CLINICAL DATA:  Questionable sepsis EXAM: PORTABLE CHEST 1 VIEW COMPARISON:  Chest radiograph 06/27/2019 FINDINGS: A left chest wall cardiac device and associated leads and right chest wall port are in stable position. The port tip terminates in the mid to upper SVC. A loop recorder is again seen projecting over the heart. The heart is mildly enlarged, unchanged. The mediastinal contours are otherwise within normal limits. There is calcified atherosclerotic plaque of the aortic arch. There is patchy opacity in the right base with a focal nodular component likely corresponding to the known right lower lobe metastatic lesion. Patchy retrocardiac opacities are also noted. There may be a trace left pleural effusion. There is no pneumothorax. There is marked degenerative change of the right shoulder. There is no acute osseous abnormality. IMPRESSION: 1. Nodular opacity in the right base likely corresponds to the known metastatic  lesion. Superimposed infection would be difficult to exclude. 2. Patchy retrocardiac opacities may reflect atelectasis or infection. 3. Trace left pleural effusion. 4. Unchanged cardiomegaly. Electronically Signed   By: Valetta Mole MD   On: 09/13/2020 10:22     Medical Consultants:   None.   Subjective:    Patty [name] relates her pain is not controlled continues to dry heave.  Objective:    Vitals:   09/13/20 1948 09/13/20 2120 09/14/20 0056 09/14/20 0515  BP:  (!) 172/49 (!) 168/49 (!) 147/46  Pulse:  60 (!) 59 64  Resp:  '18 16 18  '$ Temp:  98.1 F (36.7 C) 97.9 F (36.6 C) 97.8 F (36.6 C)  TempSrc:  Oral Oral Oral  SpO2:  95% 95% 93%  Weight:  76.2 kg    Height: 5' (1.524 m) 5' (1.524 m)     SpO2: 93  % O2 Flow Rate (L/min): 2 L/min   Intake/Output Summary (Last 24 hours) at 09/14/2020 0747 Last data filed at 09/14/2020 0540 Gross per 24 hour  Intake 1840 ml  Output 900 ml  Net 940 ml   Filed Weights   09/13/20 2120  Weight: 76.2 kg    Exam: General exam: In no acute distress. Respiratory system: Good air movement and clear to auscultation. Cardiovascular system: S1 & S2 heard, RRR. No JVD. Gastrointestinal system: Abdomen is nondistended, soft and nontender.  Extremities: No pedal edema. Skin: No rashes, lesions or ulcers Psychiatry: Judgement and insight appear normal. Mood & affect appropriate.    Data Reviewed:    Labs: Basic Metabolic Panel: Recent Labs  Lab 09/13/20 0954  NA 139  K 3.8  CL 103  CO2 27  GLUCOSE 89  BUN 14  CREATININE 0.85  CALCIUM 8.8*   GFR Estimated Creatinine Clearance: 41.7 mL/min (by C-G formula based on SCr of 0.85 mg/dL). Liver Function Tests: Recent Labs  Lab 09/13/20 0954  AST 35  ALT 16  ALKPHOS 80  BILITOT 0.6  PROT 7.3  ALBUMIN 3.1*   Recent Labs  Lab 09/13/20 0954  LIPASE 39   No results for input(s): AMMONIA in the last 168 hours. Coagulation profile Recent Labs  Lab 09/13/20 0954  INR 1.1   COVID-19 Labs  Recent Labs    09/13/20 0954  CRP 3.7*    Lab Results  Component Value Date   SARSCOV2NAA NEGATIVE 09/13/2020   SARSCOV2NAA NEGATIVE 06/09/2019   SARSCOV2NAA NEGATIVE 05/28/2019   [place] NEGATIVE 11/09/2018    CBC: Recent Labs  Lab 09/13/20 0954  WBC 8.4  NEUTROABS 6.6  HGB 12.2  HCT 37.2  MCV 97.9  PLT 293   Cardiac Enzymes: No results for input(s): CKTOTAL, CKMB, CKMBINDEX, TROPONINI in the last 168 hours. BNP (last 3 results) No results for input(s): PROBNP in the last 8760 hours. CBG: No results for input(s): GLUCAP in the last 168 hours. D-Dimer: No results for input(s): DDIMER in the last 72 hours. Hgb A1c: No results for input(s): HGBA1C in the last 72  hours. Lipid Profile: No results for input(s): CHOL, HDL, LDLCALC, TRIG, CHOLHDL, LDLDIRECT in the last 72 hours. Thyroid function studies: No results for input(s): TSH, T4TOTAL, T3FREE, THYROIDAB in the last 72 hours.  Invalid input(s): FREET3 Anemia work up: No results for input(s): VITAMINB12, FOLATE, FERRITIN, TIBC, IRON, RETICCTPCT in the last 72 hours. Sepsis Labs: Recent Labs  Lab 09/13/20 0954  WBC 8.4  LATICACIDVEN 1.2   Microbiology Recent Results (from the past 240 hour(s))  Resp Panel by RT-PCR (Flu A&B, Covid) Nasopharyngeal Swab     Status: None   Collection Time: 09/13/20 10:26 AM   Specimen: Nasopharyngeal Swab; Nasopharyngeal(NP) swabs in vial transport medium  Result Value Ref Range Status   SARS Coronavirus 2 by RT PCR NEGATIVE NEGATIVE Final    Comment: (NOTE) SARS-CoV-2 target nucleic acids are NOT DETECTED.  The SARS-CoV-2 RNA is generally detectable in upper respiratory specimens during the acute phase of infection. The lowest concentration of SARS-CoV-2 viral copies this assay can detect is 138 copies/mL. A negative result does not preclude SARS-Cov-2 infection and should not be used as the sole basis for treatment or other patient management decisions. A negative result may occur with  improper specimen collection/handling, submission of specimen other than nasopharyngeal swab, presence of viral mutation(s) within the areas targeted by this assay, and inadequate number of viral copies(<138 copies/mL). A negative result must be combined with clinical observations, patient history, and epidemiological information. The expected result is Negative.  Fact Sheet for Patients:  EntrepreneurPulse.com.au  Fact Sheet for Healthcare Providers:  IncredibleEmployment.be  This test is no t yet approved or cleared by the [place] FDA and  has been authorized for detection and/or diagnosis of SARS-CoV-2 by FDA under an  Emergency Use Authorization (EUA). This EUA will remain  in effect (meaning this test can be used) for the duration of the COVID-19 declaration under Section 564(b)(1) of the Act, 21 U.S.C.section 360bbb-3(b)(1), unless the authorization is terminated  or revoked sooner.       Influenza A by PCR NEGATIVE NEGATIVE Final   Influenza B by PCR NEGATIVE NEGATIVE Final    Comment: (NOTE) The Xpert Xpress SARS-CoV-2/FLU/RSV plus assay is intended as an aid in the diagnosis of influenza from Nasopharyngeal swab specimens and should not be used as a sole basis for treatment. Nasal washings and aspirates are unacceptable for Xpert Xpress SARS-CoV-2/FLU/RSV testing.  Fact Sheet for Patients: EntrepreneurPulse.com.au  Fact Sheet for Healthcare Providers: IncredibleEmployment.be  This test is not yet approved or cleared by the [place] FDA and has been authorized for detection and/or diagnosis of SARS-CoV-2 by FDA under an Emergency Use Authorization (EUA). This EUA will remain in effect (meaning this test can be used) for the duration of the COVID-19 declaration under Section 564(b)(1) of the Act, 21 U.S.C. section 360bbb-3(b)(1), unless the authorization is terminated or revoked.  Performed at [facility], [place] [address], [place], [place] [address]      Medications:    enoxaparin (LOVENOX) injection  40 mg Subcutaneous Q24H   senna  1 tablet Oral BID   Continuous Infusions:  methocarbamol (ROBAXIN) IV        LOS: 1 day   Charlynne Cousins  [facility]  09/14/2020, 7:47 AM

## 2020-09-14 NOTE — Progress Notes (Signed)
OT Cancellation Note  Patient Details Name: BRONWEN EDMUND MRN: [id number] DOB: [birth year]   Cancelled Treatment:    Reason Eval/Treat Not Completed: Other (comment) per MD therapy orders are d/c at this time  Jackelyn Poling OTR/L, [place] Office# [phone] Pager# [phone] 09/14/2020, 11:04 AM

## 2020-09-15 LAB — URINE CULTURE

## 2020-09-15 MED ORDER — POLYETHYLENE GLYCOL 3350 17 G PO PACK
17.0000 g | PACK | Freq: Two times a day (BID) | ORAL | Status: AC
  Administered 2020-09-15 – 2020-09-16 (×4): 17 g via ORAL
  Filled 2020-09-15 (×4): qty 1

## 2020-09-15 NOTE — Progress Notes (Signed)
Daily Progress Note   Patient Name: Patty [name]       Date: 09/15/2020 DOB: [birth year]  Age: 87 y.o. MRN#: [id number] Attending Physician: Charlynne Cousins, MD Primary Care Physician: [name], MD Admit Date: 09/13/2020  Reason for Consultation/Follow-up: Establishing goals of care  Subjective: I saw and examined Patty [name] today.  She was lying in bed in no distress.  She reports she had a good day today and is feeling better about plan overall.  Her daughter and son-in-law came in from [place] and have been helping to arrange home hospice.  Her daughter is going to be staying with her for at least the next few weeks to see how she does.  Another daughter from [place] is also driving into town and she also has the support of her daughter in [place].  We discussed care plan at time of discharge and reviewed home hospice services.  [facility] has been in contact with her and her family and has been working to arrange equipment delivery and set up admission visit.  This will likely be accomplished Monday and she is hopeful to discharge home on Monday.  Length of Stay: 2  Current Medications: Scheduled Meds:   clopidogrel  75 mg Oral Daily   digoxin  0.0625 mg Oral Daily   dofetilide  125 mcg Oral BID   enoxaparin (LOVENOX) injection  40 mg Subcutaneous Q24H   metoprolol  200 mg Oral Daily   polyethylene glycol  17 g Oral BID   senna  1 tablet Oral BID    Continuous Infusions:  methocarbamol (ROBAXIN) IV     promethazine (PHENERGAN) injection (IM or IVPB)      PRN Meds: acetaminophen **OR** acetaminophen, bisacodyl, methocarbamol (ROBAXIN) IV, morphine injection, ondansetron **OR** ondansetron (ZOFRAN) IV, polyethylene glycol, promethazine **OR**  promethazine (PHENERGAN) injection (IM or IVPB) **OR** promethazine, sodium phosphate  Physical Exam     General: Alert, awake, in no acute distress.   HEENT: No bruits, no goiter, no JVD Heart: Regular rate and rhythm. No murmur appreciated. Lungs: Good air movement, clear Abdomen: Soft, nontender, nondistended, positive bowel sounds.   Ext: No significant edema Skin: Warm and dry Neuro: Grossly intact, nonfocal.    Vital Signs: BP (!) 171/55 (BP Location: Left Arm)   Pulse 62  Temp 98 F (36.7 C) (Oral)   Resp 16   Ht 5' (1.524 m)   Wt 76.2 kg   SpO2 92%   BMI 32.81 kg/m  SpO2: SpO2: 92 % O2 Device: O2 Device: Room Air O2 Flow Rate: O2 Flow Rate (L/min): 2 L/min  Intake/output summary:  Intake/Output Summary (Last 24 hours) at 09/15/2020 1918 Last data filed at 09/15/2020 0911 Gross per 24 hour  Intake 240 ml  Output --  Net 240 ml   LBM: Last BM Date: 09/12/20 Baseline Weight: Weight: 76.2 kg Most recent weight: Weight: 76.2 kg       Palliative Assessment/Data:    Flowsheet Rows    Flowsheet Row Most Recent Value  Intake Tab   Referral Department Hospitalist  Unit at Time of Referral Oncology Unit  Palliative Care Primary Diagnosis Cancer  Date Notified 09/14/20  Palliative Care Type New Palliative care  Reason for referral Clarify Goals of Care  Date of Admission 09/13/20  Date first seen by Palliative Care 09/14/20  # of days Palliative referral response time 0 Day(s)  # of days IP prior to Palliative referral 1  Clinical Assessment   Palliative Performance Scale Score 40%  Psychosocial & Spiritual Assessment   Palliative Care Outcomes   Patient/Family meeting held? Yes  Who was at the meeting? Patient, daughter       Patient Active Problem List   Diagnosis Date Noted   Metastatic cancer ([place]) 09/13/2020   Coagulation defect ([place]) 03/20/2020   Under care of palliative care physician    ACS (acute coronary syndrome) ([place]) 06/09/2019   Heart  failure ([place]) 05/29/2019   Atrial fibrillation, chronic ([place]) 05/28/2019   Stable angina ([place]) 10/09/2018   Coagulopathy ([place]) 10/09/2018   Chronic combined systolic (congestive) and diastolic (congestive) heart failure ([place]) 10/08/2018   Peripheral neuropathy due to chemotherapy ([place]) 11/19/2017   Pancytopenia, acquired ([place]) 11/19/2017   Acute on chronic combined systolic (congestive) and diastolic (congestive) heart failure ([place]) 11/03/2017   Hyperglycemia 11/03/2017   Protein-calorie malnutrition, moderate ([place]) 10/28/2017   Other constipation 10/20/2017   Postherpetic neuralgia 10/20/2017   Cancer associated pain 10/20/2017   Physical debility 10/20/2017   Goals of care, counseling/discussion 10/20/2017   Ovarian cancer ([place]) 10/19/2017   Intraperitoneal hemorrhage    Abdominal pain 10/12/2017   Pain of joint of left ankle and foot 08/03/2017   Persistent atrial fibrillation    Warfarin anticoagulation    DOE (dyspnea on exertion)    Status post coronary artery stent placement    Normocytic anemia 07/11/2016   History of small bowel obstruction 12/22/2015   Permanent atrial fibrillation with RVR 12/15/2015   Demand ischemia ([place]) 12/15/2015   NSTEMI (non-ST elevated myocardial infarction) ([place]) 05/18/2015   Chronic anticoagulation - coumadin, CHADS2VASC=6 05/17/2015   Unstable angina ([place]) 05/17/2015   Swelling of lower extremity 04/18/2015   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   Ischemic chest pain ([name])    OSA on CPAP 05/14/2014   Chest pain 01/05/2014   Atrial flutter ([place])    Hypokalemia    Pacemaker - [employer] Jude Leadless PPM    Combined systolic and diastolic heart failure, NYHA class 3 ([place])    Tachycardia-bradycardia syndrome ([place]) 09/30/2012   Atrial fibrillation with RVR ([place]) 12/28/2011   Ventral hernia 10/16/2011   Granulosa cell tumor of ovary 04/23/2011   CAD (coronary artery disease) 10/03/2010   Essential hypertension 10/03/2010    Hyperlipidemia 10/03/2010   Renal artery  stenosis ([place]) 10/03/2010   Obesity (BMI 30.0-34.9) 10/03/2010    Palliative Care Assessment & Plan   Patient Profile: 87 y.o. female  with past medical history of ovarian cancer, CAD, A. fib, PVD and pacemaker admitted on 09/13/2020 with left lower quadrant abdominal and left hip pain.  She had CT scan that revealed worsening of patient's underlying ovarian cancer as cause of increased pain. Palliative consulted for goals of care.  Recommendations/Plan: DNR/DNI Plan for transition home with hospice support through [facility] [place] (likely Monday).  Discussed discharge planning, care at home, symptom management, and anticipatory guidance today.  Goals of Care and Additional Recommendations: Limitations on Scope of Treatment:  Comfort care and plan for home with hospice  Code Status:    Code Status Orders  (From admission, onward)           Start     Ordered   09/13/20 1817  Do not attempt resuscitation (DNR)  Continuous       Question Answer Comment  In the event of cardiac or respiratory ARREST Do not call a "code blue"   In the event of cardiac or respiratory ARREST Do not perform Intubation, CPR, defibrillation or ACLS   In the event of cardiac or respiratory ARREST Use medication by any route, position, wound care, and other measures to relive pain and suffering. May use oxygen, suction and manual treatment of airway obstruction as needed for comfort.   Comments Okay with NIV, ICU-care, pressors      09/13/20 1816           Code Status History     Date Active Date Inactive Code Status Order ID Comments User Context   06/09/2019 0345 06/15/2019 1803 DNR [id number]  Reubin Milan, MD ED   06/09/2019 0345 06/09/2019 0345 DNR [id number]  Reubin Milan, MD ED   05/28/2019 0401 05/31/2019 1505 DNR [id number]  Chauncey Mann, MD ED   11/11/2018 1532 11/16/2018 1811 DNR [id number]  Shirley Friar, PA-C Inpatient    11/09/2018 1544 11/11/2018 1532 Full Code [id number]  Leanor Kail, [place] ED   10/08/2018 1948 10/09/2018 1735 DNR [id number]  Lonn Georgia, PA-C Inpatient   11/03/2017 2010 11/05/2017 1854 DNR [id number]  Shelbie Proctor, MD ED   10/12/2017 1228 10/17/2017 1746 DNR [id number]  Karmen Bongo, MD ED   07/11/2016 0333 07/16/2016 1706 DNR [id number]  Edwin Dada, MD Inpatient   12/13/2015 1947 12/15/2015 1850 Full Code [id number]  Cheryln Manly, NP Inpatient   05/17/2015 1730 05/19/2015 1930 Full Code [id number]  Barrett, Felisa Bonier Inpatient   05/16/2014 1717 05/17/2014 1524 Full Code [id number]  Sherren Mocha, MD Inpatient   05/14/2014 0335 05/16/2014 1717 DNR [id number]  Ivor Costa, MD Inpatient   01/05/2014 1142 01/06/2014 2020 Full Code [id number]  Jerline Pain, MD ED   12/14/2013 1144 12/15/2013 1529 Full Code [id number]  Belva Crome, MD Inpatient   12/11/2013 1612 12/14/2013 1144 Full Code [id number]  Lonn Georgia, PA-C Inpatient   10/07/2013 1202 10/08/2013 1416 Full Code [id number]  [name], MD Inpatient   10/04/2013 1732 10/07/2013 1202 Full Code [id number]  Robet Leu Inpatient   12/01/2012 0335 12/02/2012 2010 DNR [id number]  Cletus Gash, MD Inpatient   03/16/2012 1757 03/17/2012 1440 Full Code [id number]  Thompson Grayer, MD Inpatient   03/11/2012 0211 03/16/2012 1757 Full Code [id number]  Horton, Jefferey Pica, RN Inpatient      Advance Directive Documentation  Flowsheet Row Most Recent Value  Type of Advance Directive Out of facility DNR (pink MOST or yellow form)  Pre-existing out of facility DNR order (yellow form or pink MOST form) --  "MOST" Form in Place? --       Prognosis:  < 6 months  Discharge Planning: Home with Hospice  Care plan was discussed with patient, hospice liaison  Thank you for allowing the Palliative Medicine Team to assist in the care of this patient.   Time In: 1800 Time Out: 1830 Total Time 30 Prolonged Time Billed No       Greater than 50%  of this time was spent counseling and coordinating care related to the above assessment and plan.  Micheline Rough, MD  Please contact Palliative Medicine Team phone at [phone] for questions and concerns.

## 2020-09-15 NOTE — Progress Notes (Signed)
[facility] PROGRESS NOTE    Progress Note  Patty [name]  [id number] DOB: [birth year] DOA: 09/13/2020 PCP: [name], MD     Brief Narrative:   Patty [name] is an 87 y.o. female past medical history of ovarian cancer CAD atrial fibrillation with a pacemaker in place presents to the ED complaining of severe left lower quadrant abdominal pain and left hip pain for 1 week in the ER exam revealed palpable mass in the left lower quadrant CT scan of the abdomen and pelvis showed worsening of the patient ovarian cancer.  Case discussed with GYN oncology and related she is not a surgical candidate or therapeutic options they recommended hospice.  Assessment/Plan:   Metastatic cancer [facility]): Progression of her ovarian cancer that is causing her acute left lower quadrant abdominal pain. Titrate medications for comfort. Pain Is controlled with narcotics, it seems like the Zofran is helping with the nausea and vomiting. She has tolerated her diet has not had a bowel movement. Will start on MiraLAX p.o. twice daily. Patient has a plan he wants to go home for several days in June transition to residential hospice  CAD: Continue current home medications. Resume Plavix  Chronic atrial fibrillation: Rate controlled resume dofetilide and metoprolol. Discontinue Eliquis  DVT prophylaxis: scd Family Communication:daughter Status is: Inpatient  Remains inpatient appropriate because:Hemodynamically unstable  Dispo: The patient is from: Home              Anticipated d/c is to: Home              Patient currently is not medically stable to d/c.   Difficult to place patient No      Code Status:     Code Status Orders  (From admission, onward)           Start     Ordered   09/13/20 1817  Do not attempt resuscitation (DNR)  Continuous       Question Answer Comment  In the event of cardiac or respiratory ARREST Do not call a "code blue"   In the event of cardiac or  respiratory ARREST Do not perform Intubation, CPR, defibrillation or ACLS   In the event of cardiac or respiratory ARREST Use medication by any route, position, wound care, and other measures to relive pain and suffering. May use oxygen, suction and manual treatment of airway obstruction as needed for comfort.   Comments Okay with NIV, ICU-care, pressors      09/13/20 1816           Code Status History     Date Active Date Inactive Code Status Order ID Comments User Context   06/09/2019 0345 06/15/2019 1803 DNR [id number]  Reubin Milan, MD ED   06/09/2019 0345 06/09/2019 0345 DNR [id number]  Reubin Milan, MD ED   05/28/2019 0401 05/31/2019 1505 DNR [id number]  Chauncey Mann, MD ED   11/11/2018 1532 11/16/2018 1811 DNR [id number]  Shirley Friar, PA-C Inpatient   11/09/2018 1544 11/11/2018 1532 Full Code [id number]  Leanor Kail, [place] [facility]   10/08/2018 1948 10/09/2018 1735 DNR [id number]  Lonn Georgia, PA-C Inpatient   11/03/2017 2010 11/05/2017 1854 DNR [id number]  Shelbie Proctor, MD ED   10/12/2017 1228 10/17/2017 1746 DNR [id number]  Karmen Bongo, MD ED   07/11/2016 0333 07/16/2016 1706 DNR [id number]  Edwin Dada, MD Inpatient   12/13/2015 1947 12/15/2015 1850 Full Code [id number]  Cheryln Manly, NP Inpatient   05/17/2015 1730  05/19/2015 1930 Full Code [id number]  Reola Mosher Inpatient   05/16/2014 1717 05/17/2014 1524 Full Code [id number]  Sherren Mocha, MD Inpatient   05/14/2014 0335 05/16/2014 1717 DNR [id number]  Ivor Costa, MD Inpatient   01/05/2014 1142 01/06/2014 2020 Full Code [id number]  Jerline Pain, MD ED   12/14/2013 1144 12/15/2013 1529 Full Code [id number]  Belva Crome, MD Inpatient   12/11/2013 1612 12/14/2013 1144 Full Code [id number]  Lonn Georgia, PA-C Inpatient   10/07/2013 1202 10/08/2013 1416 Full Code [id number]  [name], MD Inpatient   10/04/2013 1732 10/07/2013 1202 Full Code [id number]  Robet Leu Inpatient    12/01/2012 0335 12/02/2012 2010 DNR [id number]  Cletus Gash, MD Inpatient   03/16/2012 1757 03/17/2012 1440 Full Code [id number]  Thompson Grayer, MD Inpatient   03/11/2012 0211 03/16/2012 1757 Full Code [id number]  Horton, Jefferey Pica, RN Inpatient      Advance Directive Documentation    Flowsheet Row Most Recent Value  Type of Advance Directive Out of facility DNR (pink MOST or yellow form)  Pre-existing out of facility DNR order (yellow form or pink MOST form) --  "MOST" Form in Place? --         IV Access:   Peripheral IV   Procedures and diagnostic studies:   CT HIP LEFT W CONTRAST  Result Date: 09/13/2020 CLINICAL DATA:  Septic arthritis suspected, hip, no prior imaging L hip pain, ovarian ca, concern for mets vs septic joint, fever EXAM: CT OF THE LOWER LEFT EXTREMITY WITH CONTRAST TECHNIQUE: Multidetector CT imaging of the lower left extremity was performed according to the standard protocol following intravenous contrast administration. CONTRAST:  75m OMNIPAQUE IOHEXOL 350 MG/ML SOLN COMPARISON:  CT 05/16/2020 FINDINGS: Bones/Joint/Cartilage No acute fracture or dislocation of the left hip. The visualized portion of the left hemipelvis is intact without evidence of fracture or diastasis. No lytic or sclerotic bone lesion is identified. Mild osteoarthritis of the left hip as manifested by joint space narrowing, subchondral sclerosis/cystic change, and marginal osteophyte formation. No appreciable left hip joint effusion. Ligaments Suboptimally assessed by CT. Muscles and Tendons No acute musculotendinous abnormality by CT. Soft tissues No soft tissue edema or fluid collection. Vascular calcifications are present. Large left ovarian mass is seen within the pelvis. IMPRESSION: 1. No acute osseous abnormality of the left hip. No visible joint effusion or evidence to suggest septic arthritis by CT. 2. Mild osteoarthritis of the left hip. 3. Large left ovarian mass is seen within the pelvis.  Please refer to dedicated same day CT abdomen-pelvis for detailed characterization of the intrapelvic findings. Electronically Signed   By: NDavina PokeD.O.   On: 09/13/2020 11:39   CT ABDOMEN PELVIS W CONTRAST  Result Date: 09/13/2020 CLINICAL DATA:  Left lower quadrant abdominal pain and fever. EXAM: CT ABDOMEN AND PELVIS WITH CONTRAST TECHNIQUE: Multidetector CT imaging of the abdomen and pelvis was performed using the standard protocol following bolus administration of intravenous contrast. CONTRAST:  712mOMNIPAQUE IOHEXOL 350 MG/ML SOLN COMPARISON:  05/16/2020 FINDINGS: Lower chest: 2.1 cm nodule in the anterior right lower lobe was 1.9 cm previously. Several additional nodules are seen in the right middle and lower lobe. Hepatobiliary: Small subcapsular lesion posterior right liver with dystrophic calcification likely secondary to prior infection. Trace intrahepatic biliary duct dilatation is new in the interval. Gallbladder surgically absent. Common bile duct diameter upper normal in the head of pancreas. Pancreas: Mild prominence of the main pancreatic duct in  the head of pancreas. Pancreatic duct in the body and tail of pancreas is nondilated. No discrete pancreatic mass lesion evident. Spleen: No splenomegaly. No focal mass lesion. Adrenals/Urinary Tract: No adrenal nodule or mass. Left kidney is atrophic. Areas of focal cortical scarring noted right kidney. No evidence for hydroureter. The urinary bladder is nondistended and displaced to the right due to the left pelvic mass described below. Stomach/Bowel: Tiny hiatal hernia. Stomach otherwise unremarkable. Duodenum is normally positioned as is the ligament of Treitz. No small bowel wall thickening. No small bowel dilatation. The terminal ileum is normal. The appendix is normal. No gross colonic mass. No colonic wall thickening. Diverticular changes are noted in the left colon without evidence of diverticulitis. Vascular/Lymphatic: There is  advanced atherosclerotic calcification of the abdominal aorta without aneurysm. There is no gastrohepatic or hepatoduodenal ligament lymphadenopathy. No retroperitoneal or mesenteric lymphadenopathy. No pelvic sidewall lymphadenopathy. Reproductive: Uterus surgically absent. Since the prior exam, marked progression of the left lower quadrant mass lesion, now measuring 11.0 x 9.7 cm compared to 9.5 x 6.9 cm previously. Lesion extends anteriorly to the left rectus sheath where there is new heterogeneous soft tissue thickening of the left rectus sheath, potentially related to direct tumor extension although associated component of rectus sheath hematoma could have this appearance. No right adnexal mass. Other: 1.3 x 1.1 cm midline omental nodule measured previously is 2.7 x 2.1 cm on image 50/2 today. Nodule on 44/7 was 2.8 x 2.7 cm central mesenteric 2.9 x 2.6 cm previously (remeasured). No intraperitoneal free fluid. Musculoskeletal: Evidence for prior ventral mesh placement. Small midline ventral hernia identified at the inferior margin of the mesh containing small bowel without complicating features. IMPRESSION: 1. Marked interval progression of the left lower quadrant mass lesion, now measuring up to 11.0 x 9.7 cm compared to 9.5 x 6.9 cm previously. Lesion extends anteriorly to and apparently into the left rectus sheath where there is new heterogeneous soft tissue thickening/expansion of the left rectus sheath, potentially related to direct tumor extension although associated component of rectus sheath hematoma could have this appearance. 2. Interval increase in size of the midline omental nodule with similar appearance of the central mesenteric soft tissue nodule, both concerning for metastatic involvement. 3. Interval progression of pulmonary nodules in the right lung base consistent with metastatic disease. 4. New trace intrahepatic biliary duct dilatation with upper normal common bile duct diameter.  Correlation with liver function test may prove helpful. 5. Left colonic diverticulosis without diverticulitis. 6. Small midline ventral hernia at the inferior margin of the mesh containing short segment small bowel without complicating features. 7. Aortic Atherosclerosis (ICD10-I70.0). Electronically Signed   By: Misty Stanley M.D.   On: 09/13/2020 12:12   DG Chest Port 1 View  Result Date: 09/13/2020 CLINICAL DATA:  Questionable sepsis EXAM: PORTABLE CHEST 1 VIEW COMPARISON:  Chest radiograph 06/27/2019 FINDINGS: A left chest wall cardiac device and associated leads and right chest wall port are in stable position. The port tip terminates in the mid to upper SVC. A loop recorder is again seen projecting over the heart. The heart is mildly enlarged, unchanged. The mediastinal contours are otherwise within normal limits. There is calcified atherosclerotic plaque of the aortic arch. There is patchy opacity in the right base with a focal nodular component likely corresponding to the known right lower lobe metastatic lesion. Patchy retrocardiac opacities are also noted. There may be a trace left pleural effusion. There is no pneumothorax. There is marked degenerative change of  the right shoulder. There is no acute osseous abnormality. IMPRESSION: 1. Nodular opacity in the right base likely corresponds to the known metastatic lesion. Superimposed infection would be difficult to exclude. 2. Patchy retrocardiac opacities may reflect atelectasis or infection. 3. Trace left pleural effusion. 4. Unchanged cardiomegaly. Electronically Signed   By: Valetta Mole MD   On: 09/13/2020 10:22     Medical Consultants:   None.   Subjective:    Patty [name] no further dry heaving, narcotics controlling the pain.  Objective:    Vitals:   09/14/20 0515 09/14/20 1357 09/14/20 2203 09/15/20 0530  BP: (!) 147/46 (!) 168/52 (!) 148/55 (!) 169/52  Pulse: 64 62 63 60  Resp: '18 16 17 17  '$ Temp: 97.8 F (36.6 C) 98 F  (36.7 C) 98 F (36.7 C) 98 F (36.7 C)  TempSrc: Oral  Oral Oral  SpO2: 93% 97% 91% 92%  Weight:      Height:       SpO2: 92 % O2 Flow Rate (L/min): 2 L/min   Intake/Output Summary (Last 24 hours) at 09/15/2020 0800 Last data filed at 09/14/2020 1440 Gross per 24 hour  Intake 220 ml  Output --  Net 220 ml    Filed Weights   09/13/20 2120  Weight: 76.2 kg    Exam: General exam: In no acute distress. Respiratory system: Good air movement and clear to auscultation. Cardiovascular system: S1 & S2 heard, RRR. No JVD. Gastrointestinal system: Abdomen is nondistended, soft and nontender.  Extremities: No pedal edema. Skin: No rashes, lesions or ulcers  Data Reviewed:    Labs: Basic Metabolic Panel: Recent Labs  Lab 09/13/20 0954  NA 139  K 3.8  CL 103  CO2 27  GLUCOSE 89  BUN 14  CREATININE 0.85  CALCIUM 8.8*    GFR Estimated Creatinine Clearance: 41.7 mL/min (by C-G formula based on SCr of 0.85 mg/dL). Liver Function Tests: Recent Labs  Lab 09/13/20 0954  AST 35  ALT 16  ALKPHOS 80  BILITOT 0.6  PROT 7.3  ALBUMIN 3.1*    Recent Labs  Lab 09/13/20 0954  LIPASE 39    No results for input(s): AMMONIA in the last 168 hours. Coagulation profile Recent Labs  Lab 09/13/20 0954  INR 1.1    COVID-19 Labs  Recent Labs    09/13/20 0954  CRP 3.7*     Lab Results  Component Value Date   SARSCOV2NAA NEGATIVE 09/13/2020   SARSCOV2NAA NEGATIVE 06/09/2019   SARSCOV2NAA NEGATIVE 05/28/2019   [place] NEGATIVE 11/09/2018    CBC: Recent Labs  Lab 09/13/20 0954  WBC 8.4  NEUTROABS 6.6  HGB 12.2  HCT 37.2  MCV 97.9  PLT 293    Cardiac Enzymes: No results for input(s): CKTOTAL, CKMB, CKMBINDEX, TROPONINI in the last 168 hours. BNP (last 3 results) No results for input(s): PROBNP in the last 8760 hours. CBG: No results for input(s): GLUCAP in the last 168 hours. D-Dimer: No results for input(s): DDIMER in the last 72 hours. Hgb  A1c: No results for input(s): HGBA1C in the last 72 hours. Lipid Profile: No results for input(s): CHOL, HDL, LDLCALC, TRIG, CHOLHDL, LDLDIRECT in the last 72 hours. Thyroid function studies: No results for input(s): TSH, T4TOTAL, T3FREE, THYROIDAB in the last 72 hours.  Invalid input(s): FREET3 Anemia work up: No results for input(s): VITAMINB12, FOLATE, FERRITIN, TIBC, IRON, RETICCTPCT in the last 72 hours. Sepsis Labs: Recent Labs  Lab 09/13/20 0954  WBC 8.4  LATICACIDVEN 1.2    Microbiology Recent Results (from the past 240 hour(s))  Blood culture (routine single)     Status: None (Preliminary result)   Collection Time: 09/13/20  9:54 AM   Specimen: BLOOD  Result Value Ref Range Status   Specimen Description   Final    BLOOD LEFT ANTECUBITAL Performed at [facility] [address], [place], [place] [address]    Special Requests   Final    BOTTLES DRAWN AEROBIC AND ANAEROBIC Blood Culture results may not be optimal due to an excessive volume of blood received in culture bottles Performed at [facility] [address], [place], [place] [address]    Culture   Final    NO GROWTH 1 DAY Performed at [facility], [place] [address] [place], [place] [address]    Report Status PENDING  Incomplete  Resp Panel by RT-PCR (Flu A&B, Covid) Nasopharyngeal Swab     Status: None   Collection Time: 09/13/20 10:26 AM   Specimen: Nasopharyngeal Swab; Nasopharyngeal(NP) swabs in vial transport medium  Result Value Ref Range Status   SARS Coronavirus 2 by RT PCR NEGATIVE NEGATIVE Final    Comment: (NOTE) SARS-CoV-2 target nucleic acids are NOT DETECTED.  The SARS-CoV-2 RNA is generally detectable in upper respiratory specimens during the acute phase of infection. The lowest concentration of SARS-CoV-2 viral copies this assay can detect is 138 copies/mL. A negative result does not preclude SARS-Cov-2 infection and should not be used as the  sole basis for treatment or other patient management decisions. A negative result may occur with  improper specimen collection/handling, submission of specimen other than nasopharyngeal swab, presence of viral mutation(s) within the areas targeted by this assay, and inadequate number of viral copies(<138 copies/mL). A negative result must be combined with clinical observations, patient history, and epidemiological information. The expected result is Negative.  Fact Sheet for Patients:  EntrepreneurPulse.com.au  Fact Sheet for Healthcare Providers:  IncredibleEmployment.be  This test is no t yet approved or cleared by the [place] FDA and  has been authorized for detection and/or diagnosis of SARS-CoV-2 by FDA under an Emergency Use Authorization (EUA). This EUA will remain  in effect (meaning this test can be used) for the duration of the COVID-19 declaration under Section 564(b)(1) of the Act, 21 U.S.C.section 360bbb-3(b)(1), unless the authorization is terminated  or revoked sooner.       Influenza A by PCR NEGATIVE NEGATIVE Final   Influenza B by PCR NEGATIVE NEGATIVE Final    Comment: (NOTE) The Xpert Xpress SARS-CoV-2/FLU/RSV plus assay is intended as an aid in the diagnosis of influenza from Nasopharyngeal swab specimens and should not be used as a sole basis for treatment. Nasal washings and aspirates are unacceptable for Xpert Xpress SARS-CoV-2/FLU/RSV testing.  Fact Sheet for Patients: EntrepreneurPulse.com.au  Fact Sheet for Healthcare Providers: IncredibleEmployment.be  This test is not yet approved or cleared by the [place] FDA and has been authorized for detection and/or diagnosis of SARS-CoV-2 by FDA under an Emergency Use Authorization (EUA). This EUA will remain in effect (meaning this test can be used) for the duration of the COVID-19 declaration under Section 564(b)(1) of the  Act, 21 U.S.C. section 360bbb-3(b)(1), unless the authorization is terminated or revoked.  Performed at [facility], [place] [address], [place], [place] [address]      Medications:    clopidogrel  75 mg Oral Daily   digoxin  0.0625 mg Oral Daily  dofetilide  125 mcg Oral BID   enoxaparin (LOVENOX) injection  40 mg Subcutaneous Q24H   metoprolol  200 mg Oral Daily   senna  1 tablet Oral BID   Continuous Infusions:  methocarbamol (ROBAXIN) IV     promethazine (PHENERGAN) injection (IM or IVPB)        LOS: 2 days   Charlynne Cousins  [facility]  09/15/2020, 8:00 AM

## 2020-09-15 NOTE — Consult Note (Signed)
Consultation Note Date: 09/15/2020   Patient Name: Patty [name]  DOB: 1930-12-09  MRN: [id number]  Age / Sex: 87 y.o., female  PCP: [name], MD Referring Physician: Charlynne Cousins, MD  Reason for Consultation: Establishing goals of care  HPI/Patient Profile: 87 y.o. female  with past medical history of ovarian cancer, CAD, A. fib, PVD and pacemaker admitted on 09/13/2020 with left lower quadrant abdominal and left hip pain.  She had CT scan that revealed worsening of patient's underlying ovarian cancer as cause of increased pain. Palliative consulted for goals of care.  Clinical Assessment and Goals of Care: I met today with Patty [name] and her daughter, Patty [name].   I introduced palliative care as specialized medical care for people living with serious illness. It focuses on providing relief from the symptoms and stress of a serious illness. The goal is to improve quality of life for both the patient and the family.  She is familiar with palliative care services that she is currently enrolled in care connections program through [facility] the [place].  We discussed clinical course as well as wishes moving forward in regard to advanced directives.  Concepts specific to code status and rehospitalization discussed.  We discussed difference between a aggressive medical intervention path and a palliative, comfort focused care path.  Values and goals of care important to patient and family were attempted to be elicited.  She reports continued decline in her nutrition and functional status.  She understands that she has terminal disease and states that her goal is to work and feel as well as possible for as long as she can.  Patty [name] reports that while she currently lives alone, family can make arrangements to have people there with her through family support and also hired help if needed.  Concept of Hospice  and Palliative Care were discussed.   Questions and concerns addressed.   PMT will continue to support holistically.   SUMMARY OF RECOMMENDATIONS   -DNR/DNI -Ms. Wintle has been working with home palliative care services through [facility] [place].  She would like to work to transition home with home hospice through [facility] at time of discharge. -We discussed prognosis is difficult to determine but likely as short as a couple weeks (if she develops obstruction) to short period of months (which is what would be my guess based upon her current trajectory of decline in regard to functional status).  She would eventually be open to transition to residential hospice, however, she wants to go home for as long as possible to arrange her affairs as best possible.  Family reports that they can make arrangements to have people there with her so she is not alone, particularly at night. -Referral placed for home hospice through [facility].  Code Status/Advance Care Planning: DNR  Palliative Prophylaxis:  Bowel Regimen and Frequent Pain Assessment  Additional Recommendations (Limitations, Scope, Preferences): Avoid Hospitalization  Psycho-social/Spiritual:  Desire for further Chaplaincy support: Not addressed today Additional Recommendations: Education on Hospice  Prognosis:  <  6 months  Discharge Planning: Home with Hospice      Primary Diagnoses: Present on Admission:  Metastatic cancer ([place])   I have reviewed the medical record, interviewed the patient and family, and examined the patient. The following aspects are pertinent.  Past Medical History:  Diagnosis Date   Bleeding behind the abdominal cavity 10/2017   Chronic anticoagulation - coumadin, CHADS2VASC=6 05/17/2015   CKD (chronic kidney disease), stage III ([name])    Combined systolic and diastolic heart failure ([name])    Coronary artery disease    a. s/p multiple stents - stenting x 2 to the LAD and x 1 to the LCx  in 2000, rotational arthrectomy to proximal LCx and DES to LAD in 2014 and DES to LCx in 2014, along with DES to LAD for re-in-stent stenosis in 2015, and DES to ostial LAD in 2016. b. 07/2016 - orbital atherectomy & DES to mid LCx.   Diverticulitis    Granulosa cell carcinoma ([place])    abd; last episode was in 2009   Hyperlipidemia    Hypertension    LBBB (left bundle branch block)    Myocardial infarction ([place]) 2002   Obesity    OSA on CPAP    Ovarian ca [facility]) 2019   Pacemaker failure    a. Prior leadless PPM with premature battery failure, being managed conservatively without replacement.   Peripheral vascular disease ([place])    Permanent atrial fibrillation ([place]) 2013   Renal artery stenosis ([name])    Tachycardia-bradycardia syndrome ([place])    a. s/p leadless pacemaker ([name]) implanted by Dr [name]   Varicose veins    Social History   Socioeconomic History   Marital status: Widowed    Spouse name: Not on file   Number of children: 4   Years of education: Not on file   Highest education level: Not on file  Occupational History   Occupation: Retired Pharmacist, hospital and real estate  Tobacco Use   Smoking status: Former    Packs/day: 1.00    Years: 32.00    Pack years: 32.00    Types: Cigarettes    Quit date: 02/03/1974    Years since quitting: 46.6   Smokeless tobacco: Never  Vaping Use   Vaping Use: Never used  Substance and Sexual Activity   Alcohol use: Yes    Alcohol/week: 7.0 standard drinks    Types: 7 Glasses of wine per week    Comment: one glass wine nightly with dinner   Drug use: No   Sexual activity: Never  Other Topics Concern   Not on file  Social History Narrative   Lives alone.   Social Determinants of Health   Financial Resource Strain: Not on file  Food Insecurity: Not on file  Transportation Needs: Not on file  Physical Activity: Not on file  Stress: Not on file  Social Connections: Not on file   Family History  Problem Relation Age of Onset    Stroke Mother    Heart attack Father    Diabetes Father    Hypertension Father    Heart attack Brother    Diabetes Brother    Hypertension Brother    Kidney failure Brother    Scheduled Meds:  clopidogrel  75 mg Oral Daily   digoxin  0.0625 mg Oral Daily   dofetilide  125 mcg Oral BID   enoxaparin (LOVENOX) injection  40 mg Subcutaneous Q24H   metoprolol  200 mg Oral Daily   polyethylene glycol  17 g Oral BID   senna  1 tablet Oral BID   Continuous Infusions:  methocarbamol (ROBAXIN) IV     promethazine (PHENERGAN) injection (IM or IVPB)     PRN Meds:.acetaminophen **OR** acetaminophen, bisacodyl, methocarbamol (ROBAXIN) IV, morphine injection, ondansetron **OR** ondansetron (ZOFRAN) IV, polyethylene glycol, promethazine **OR** promethazine (PHENERGAN) injection (IM or IVPB) **OR** promethazine, sodium phosphate Medications Prior to Admission:  Prior to Admission medications   Medication Sig Start Date End Date Taking? Authorizing Provider  acetaminophen (TYLENOL) 500 MG tablet Take 500 mg by mouth every 6 (six) hours as needed for mild pain or headache.    Yes [provider]  amLODipine (NORVASC) 5 MG tablet Take 1 tablet (5 mg total) by mouth 2 (two) times daily. 02/27/20  Yes [name], MD  beta carotene w/minerals (OCUVITE) tablet Take 1 tablet by mouth 2 (two) times daily.    Yes [provider]  calcium citrate-vitamin D (CITRACAL+D) 315-200 MG-UNIT tablet Take 1 tablet by mouth daily.   Yes [provider]  clopidogrel (PLAVIX) 75 MG tablet TAKE 1 TABLET BY MOUTH EVERY DAY Patient taking differently: Take 75 mg by mouth daily. 02/27/20  Yes [name], MD  Coenzyme Q-10 100 MG capsule Take 200 mg by mouth daily.    Yes [provider]  digoxin (LANOXIN) 0.125 MG tablet Take 0.5 tablets (0.0625 mg total) by mouth daily. 07/11/20  Yes [name], MD  dofetilide (TIKOSYN) 125 MCG capsule TAKE 1 CAPSULE BY MOUTH  TWICE A DAY Patient taking differently: Take 125 mcg by mouth 2 (two) times daily. 09/03/20  Yes [name], MD  gabapentin (NEURONTIN) 300 MG capsule Take 1 capsule (300 mg total) by mouth 2 (two) times daily. 10/12/19  Yes Gorsuch, Ni, MD  isosorbide mononitrate (IMDUR) 60 MG 24 hr tablet TAKE 1.5 TABLETS BY MOUTH DAILY Patient taking differently: Take 90 mg by mouth daily. 08/23/20  Yes [name], MD  magnesium oxide (MAG-OX) 400 MG tablet Take 1 tablet (400 mg total) by mouth daily. 07/09/20  Yes Sherran Needs, NP  metoprolol (TOPROL-XL) 200 MG 24 hr tablet TAKE 1 TABLET DAILY WITH OR IMMEDIATELY FOLLOWING A MEAL. Patient taking differently: Take 200 mg by mouth daily. 12/12/19  Yes [name], MD  Multiple Vitamin (MULTIVITAMIN) capsule Take 1 capsule by mouth daily.   Yes [provider]  nitroGLYCERIN (NITROSTAT) 0.4 MG SL tablet PLACE 1 TABLET (0.4 MG TOTAL) UNDER THE TONGUE EVERY 5 (FIVE) MINUTES AS NEEDED FOR CHEST PAIN Patient taking differently: Place 0.4 mg under the tongue every 5 (five) minutes as needed for chest pain. 01/09/20  Yes Sherran Needs, NP  potassium chloride SA (KLOR-CON) 20 MEQ tablet Take 2 tablets (40 mEq total) by mouth 2 (two) times daily. 05/17/20  Yes Allred, Jeneen Rinks, MD  rosuvastatin (CRESTOR) 40 MG tablet Take 1 tablet (40 mg total) by mouth daily. 12/06/19  Yes Varanasi, Charlann Lange, MD  XARELTO 15 MG TABS tablet TAKE 1 TABLET BY MOUTH DAILY WITH SUPPER Patient taking differently: Take 15 mg by mouth daily with supper. 05/30/20  Yes [name], MD  furosemide (LASIX) 40 MG tablet TAKE 2 TABLETS BY MOUTH EVERY MORNING AND 2 TABLETS AS NEEDED FOR WEIGHT GAIN/ABDOMINAL DISTENSION Patient taking differently: Take 80 mg by mouth See admin instructions. Take 2 tabs ( 22m) every morning and 2 tabs ( 80 mg) as needed for weight gain in the afternoon. 07/04/20   VIrish Lack  Charlann Lange, MD   Allergies  Allergen Reactions   Bee  Venom Anaphylaxis   Other Nausea And Vomiting and Other (See Comments)    Pain medications cause severe vomiting. Tolerated slow IV morphine drip   Oxycodone Hcl Nausea And Vomiting   Amiodarone Nausea Only   Chlorhexidine    Oxycodone-Acetaminophen Other (See Comments)   Prednisone Palpitations and Other (See Comments)    "Rapid Heart Beat"   Review of Systems  Constitutional:  Positive for activity change and fatigue.  Gastrointestinal:  Positive for abdominal distention, abdominal pain and constipation.  Neurological:  Positive for weakness.  Psychiatric/Behavioral:  Positive for sleep disturbance.    Physical Exam General: Alert, awake, in no acute distress.   HEENT: No bruits, no goiter, no JVD Heart: Regular rate and rhythm. No murmur appreciated. Lungs: Good air movement, clear Abdomen: Soft, nontender, nondistended, positive bowel sounds.   Ext: No significant edema Skin: Warm and dry Neuro: Grossly intact, nonfocal.  Vital Signs: BP (!) 169/52 (BP Location: Right Arm)   Pulse 60   Temp 98 F (36.7 C) (Oral)   Resp 17   Ht 5' (1.524 m)   Wt 76.2 kg   SpO2 92%   BMI 32.81 kg/m  Pain Scale: 0-10 POSS *See Group Information*: 1-Acceptable,Awake and alert Pain Score: 5    SpO2: SpO2: 92 % O2 Device:SpO2: 92 % O2 Flow Rate: .O2 Flow Rate (L/min): 2 L/min  IO: Intake/output summary:  Intake/Output Summary (Last 24 hours) at 09/15/2020 0854 Last data filed at 09/14/2020 1440 Gross per 24 hour  Intake 220 ml  Output --  Net 220 ml    LBM: Last BM Date: 09/12/20 Baseline Weight: Weight: 76.2 kg Most recent weight: Weight: 76.2 kg     Palliative Assessment/Data:   Flowsheet Rows    Flowsheet Row Most Recent Value  Intake Tab   Referral Department Hospitalist  Unit at Time of Referral Oncology Unit  Palliative Care Primary Diagnosis Cancer  Date Notified 09/14/20  Palliative Care Type New Palliative care  Reason for referral Clarify Goals of Care  Date  of Admission 09/13/20  Date first seen by Palliative Care 09/14/20  # of days Palliative referral response time 0 Day(s)  # of days IP prior to Palliative referral 1  Clinical Assessment   Palliative Performance Scale Score 40%  Psychosocial & Spiritual Assessment   Palliative Care Outcomes   Patient/Family meeting held? Yes  Who was at the meeting? Patient, daughter       Time In: 44 Time Out: 1540 Time Total: 80 Greater than 50%  of this time was spent counseling and coordinating care related to the above assessment and plan.  Signed by: Micheline Rough, MD   Please contact Palliative Medicine Team phone at [phone] for questions and concerns.  For individual provider: See Shea Evans

## 2020-09-16 NOTE — Progress Notes (Signed)
[facility] PROGRESS NOTE    Progress Note  Sundeep Holderfield Kingbird  [id number] DOB: [birth year] DOA: 09/13/2020 PCP: [name], MD     Brief Narrative:   Mckinzy Minelli Clopper is an 87 y.o. female past medical history of ovarian cancer CAD atrial fibrillation with a pacemaker in place presents to the ED complaining of severe left lower quadrant abdominal pain and left hip pain for 1 week in the ER exam revealed palpable mass in the left lower quadrant CT scan of the abdomen and pelvis showed worsening of the patient ovarian cancer.  Case discussed with GYN oncology and related she is not a surgical candidate or therapeutic options they recommended hospice.  Assessment/Plan:   Metastatic cancer [facility]): Progression of her ovarian cancer that is causing her acute left lower quadrant abdominal pain. Titrate medications for comfort. Pain is controlled as well as nausea. She will need to go home on MiraLAX and narcotics. Patient has a plan he wants to go home for several days in June transition to residential hospice Arrange discharge for 09/17/2020.  CAD: Continue current home medications. Resume Plavix  Chronic atrial fibrillation: Rate controlled resume dofetilide and metoprolol. Discontinue Eliquis  DVT prophylaxis: scd Family Communication:daughter Status is: Inpatient  Remains inpatient appropriate because:Hemodynamically unstable  Dispo: The patient is from: Home              Anticipated d/c is to: Home              Patient currently is not medically stable to d/c.   Difficult to place patient No      Code Status:     Code Status Orders  (From admission, onward)           Start     Ordered   09/13/20 1817  Do not attempt resuscitation (DNR)  Continuous       Question Answer Comment  In the event of cardiac or respiratory ARREST Do not call a "code blue"   In the event of cardiac or respiratory ARREST Do not perform Intubation, CPR, defibrillation or ACLS    In the event of cardiac or respiratory ARREST Use medication by any route, position, wound care, and other measures to relive pain and suffering. May use oxygen, suction and manual treatment of airway obstruction as needed for comfort.   Comments Okay with NIV, ICU-care, pressors      09/13/20 1816           Code Status History     Date Active Date Inactive Code Status Order ID Comments User Context   06/09/2019 0345 06/15/2019 1803 DNR [id number]  Reubin Milan, MD ED   06/09/2019 0345 06/09/2019 0345 DNR [id number]  Reubin Milan, MD ED   05/28/2019 0401 05/31/2019 1505 DNR [id number]  Chauncey Mann, MD ED   11/11/2018 1532 11/16/2018 1811 DNR [id number]  Shirley Friar, PA-C Inpatient   11/09/2018 1544 11/11/2018 1532 Full Code [id number]  Leanor Kail, [place] ED   10/08/2018 1948 10/09/2018 1735 DNR [id number]  Lonn Georgia, PA-C Inpatient   11/03/2017 2010 11/05/2017 1854 DNR [id number]  Shelbie Proctor, MD ED   10/12/2017 1228 10/17/2017 1746 DNR [id number]  Karmen Bongo, MD ED   07/11/2016 0333 07/16/2016 1706 DNR [id number]  Edwin Dada, MD Inpatient   12/13/2015 1947 12/15/2015 1850 Full Code [id number]  Cheryln Manly, NP Inpatient   05/17/2015 1730 05/19/2015 1930 Full Code [id number]  Barrett, Evelene Croon, PA-C Inpatient   05/16/2014  1717 05/17/2014 1524 Full Code [id number]  Sherren Mocha, MD Inpatient   05/14/2014 0335 05/16/2014 1717 DNR [id number]  Ivor Costa, MD Inpatient   01/05/2014 1142 01/06/2014 2020 Full Code [id number]  Jerline Pain, MD ED   12/14/2013 1144 12/15/2013 1529 Full Code [id number]  Belva Crome, MD Inpatient   12/11/2013 1612 12/14/2013 1144 Full Code [id number]  Reola Mosher Inpatient   10/07/2013 1202 10/08/2013 1416 Full Code [id number]  [name], MD Inpatient   10/04/2013 1732 10/07/2013 1202 Full Code [id number]  Robet Leu Inpatient   12/01/2012 0335 12/02/2012 2010 DNR [id number]  Cletus Gash, MD Inpatient    03/16/2012 1757 03/17/2012 1440 Full Code [id number]  Thompson Grayer, MD Inpatient   03/11/2012 0211 03/16/2012 1757 Full Code [id number]  Horton, Jefferey Pica, RN Inpatient      Advance Directive Documentation    Flowsheet Row Most Recent Value  Type of Advance Directive Out of facility DNR (pink MOST or yellow form)  Pre-existing out of facility DNR order (yellow form or pink MOST form) --  "MOST" Form in Place? --         IV Access:   Peripheral IV   Procedures and diagnostic studies:   No results found.   Medical Consultants:   None.   Subjective:    Amarii E Luberto dry heaving is resolved tolerating her diet had a good bowel movement.  Objective:    Vitals:   09/15/20 0530 09/15/20 1607 09/15/20 2027 09/16/20 0532  BP: (!) 169/52 (!) 171/55 (!) 151/52 (!) 150/50  Pulse: 60 62 60 60  Resp: '17 16 18 18  '$ Temp: 98 F (36.7 C) 98 F (36.7 C) 98 F (36.7 C) 98.6 F (37 C)  TempSrc: Oral Oral Oral Oral  SpO2: 92% 92% 90% 90%  Weight:      Height:       SpO2: 90 % O2 Flow Rate (L/min): 2 L/min   Intake/Output Summary (Last 24 hours) at 09/16/2020 0803 Last data filed at 09/15/2020 1919 Gross per 24 hour  Intake 240 ml  Output 300 ml  Net -60 ml    Filed Weights   09/13/20 2120  Weight: 76.2 kg    Exam: General exam: In no acute distress. Respiratory system: Good air movement and clear to auscultation. Cardiovascular system: S1 & S2 heard, RRR. No JVD. Gastrointestinal system: Abdomen is nondistended, soft and nontender.  Extremities: No pedal edema. Skin: No rashes, lesions or ulcers  Data Reviewed:    Labs: Basic Metabolic Panel: Recent Labs  Lab 09/13/20 0954  NA 139  K 3.8  CL 103  CO2 27  GLUCOSE 89  BUN 14  CREATININE 0.85  CALCIUM 8.8*    GFR Estimated Creatinine Clearance: 41.7 mL/min (by C-G formula based on SCr of 0.85 mg/dL). Liver Function Tests: Recent Labs  Lab 09/13/20 0954  AST 35  ALT 16  ALKPHOS 80  BILITOT  0.6  PROT 7.3  ALBUMIN 3.1*    Recent Labs  Lab 09/13/20 0954  LIPASE 39    No results for input(s): AMMONIA in the last 168 hours. Coagulation profile Recent Labs  Lab 09/13/20 0954  INR 1.1    COVID-19 Labs  Recent Labs    09/13/20 0954  CRP 3.7*     Lab Results  Component Value Date   SARSCOV2NAA NEGATIVE 09/13/2020   SARSCOV2NAA NEGATIVE 06/09/2019   SARSCOV2NAA NEGATIVE 05/28/2019   [place] NEGATIVE 11/09/2018    CBC:  Recent Labs  Lab 09/13/20 0954  WBC 8.4  NEUTROABS 6.6  HGB 12.2  HCT 37.2  MCV 97.9  PLT 293    Cardiac Enzymes: No results for input(s): CKTOTAL, CKMB, CKMBINDEX, TROPONINI in the last 168 hours. BNP (last 3 results) No results for input(s): PROBNP in the last 8760 hours. CBG: No results for input(s): GLUCAP in the last 168 hours. D-Dimer: No results for input(s): DDIMER in the last 72 hours. Hgb A1c: No results for input(s): HGBA1C in the last 72 hours. Lipid Profile: No results for input(s): CHOL, HDL, LDLCALC, TRIG, CHOLHDL, LDLDIRECT in the last 72 hours. Thyroid function studies: No results for input(s): TSH, T4TOTAL, T3FREE, THYROIDAB in the last 72 hours.  Invalid input(s): FREET3 Anemia work up: No results for input(s): VITAMINB12, FOLATE, FERRITIN, TIBC, IRON, RETICCTPCT in the last 72 hours. Sepsis Labs: Recent Labs  Lab 09/13/20 0954  WBC 8.4  LATICACIDVEN 1.2    Microbiology Recent Results (from the past 240 hour(s))  Blood culture (routine single)     Status: None (Preliminary result)   Collection Time: 09/13/20  9:54 AM   Specimen: BLOOD  Result Value Ref Range Status   Specimen Description   Final    BLOOD LEFT ANTECUBITAL Performed at [facility] [address] [place], [place] [address]    Special Requests   Final    BOTTLES DRAWN AEROBIC AND ANAEROBIC Blood Culture results may not be optimal due to an excessive volume of blood received in culture bottles Performed at  [facility] [address] [place], [place] [address]    Culture   Final    NO GROWTH 2 DAYS Performed at [facility] [address] [place], [place] [address]    Report Status PENDING  Incomplete  Resp Panel by RT-PCR (Flu A&B, Covid) Nasopharyngeal Swab     Status: None   Collection Time: 09/13/20 10:26 AM   Specimen: Nasopharyngeal Swab; Nasopharyngeal(NP) swabs in vial transport medium  Result Value Ref Range Status   SARS Coronavirus 2 by RT PCR NEGATIVE NEGATIVE Final    Comment: (NOTE) SARS-CoV-2 target nucleic acids are NOT DETECTED.  The SARS-CoV-2 RNA is generally detectable in upper respiratory specimens during the acute phase of infection. The lowest concentration of SARS-CoV-2 viral copies this assay can detect is 138 copies/mL. A negative result does not preclude SARS-Cov-2 infection and should not be used as the sole basis for treatment or other patient management decisions. A negative result may occur with  improper specimen collection/handling, submission of specimen other than nasopharyngeal swab, presence of viral mutation(s) within the areas targeted by this assay, and inadequate number of viral copies(<138 copies/mL). A negative result must be combined with clinical observations, patient history, and epidemiological information. The expected result is Negative.  Fact Sheet for Patients:  EntrepreneurPulse.com.au  Fact Sheet for Healthcare Providers:  IncredibleEmployment.be  This test is no t yet approved or cleared by the [place] FDA and  has been authorized for detection and/or diagnosis of SARS-CoV-2 by FDA under an Emergency Use Authorization (EUA). This EUA will remain  in effect (meaning this test can be used) for the duration of the COVID-19 declaration under Section 564(b)(1) of the Act, 21 U.S.C.section 360bbb-3(b)(1), unless the authorization is terminated  or revoked sooner.        Influenza A by PCR NEGATIVE NEGATIVE Final   Influenza B by PCR NEGATIVE NEGATIVE Final    Comment: (NOTE) The Xpert Xpress  SARS-CoV-2/FLU/RSV plus assay is intended as an aid in the diagnosis of influenza from Nasopharyngeal swab specimens and should not be used as a sole basis for treatment. Nasal washings and aspirates are unacceptable for Xpert Xpress SARS-CoV-2/FLU/RSV testing.  Fact Sheet for Patients: EntrepreneurPulse.com.au  Fact Sheet for Healthcare Providers: IncredibleEmployment.be  This test is not yet approved or cleared by the [place] FDA and has been authorized for detection and/or diagnosis of SARS-CoV-2 by FDA under an Emergency Use Authorization (EUA). This EUA will remain in effect (meaning this test can be used) for the duration of the COVID-19 declaration under Section 564(b)(1) of the Act, 21 U.S.C. section 360bbb-3(b)(1), unless the authorization is terminated or revoked.  Performed at [facility], [place] [address], [place], [place] [address]   Urine Culture     Status: Abnormal   Collection Time: 09/13/20 10:49 AM   Specimen: In/Out Cath Urine  Result Value Ref Range Status   Specimen Description   Final    IN/OUT CATH URINE Performed at [facility] [address] [place], [place] [address]    Special Requests   Final    NONE Performed at [facility], [place] [address], [place], [place] [address]    Culture MULTIPLE SPECIES PRESENT, SUGGEST RECOLLECTION (A)  Final   Report Status 09/15/2020 FINAL  Final     Medications:    clopidogrel  75 mg Oral Daily   digoxin  0.0625 mg Oral Daily   dofetilide  125 mcg Oral BID   enoxaparin (LOVENOX) injection  40 mg Subcutaneous Q24H   metoprolol  200 mg Oral Daily   polyethylene glycol  17 g Oral BID   senna  1 tablet Oral BID   Continuous Infusions:  methocarbamol (ROBAXIN) IV      promethazine (PHENERGAN) injection (IM or IVPB)        LOS: 3 days   Charlynne Cousins  [facility]  09/16/2020, 8:03 AM

## 2020-09-17 MED ORDER — PROMETHAZINE HCL 6.25 MG/5ML PO SYRP: 12.5000 mg | ORAL_SOLUTION | Freq: Four times a day (QID) | ORAL | 1 refills | Status: AC | PRN

## 2020-09-17 MED ORDER — POLYETHYLENE GLYCOL 3350 17 GM/SCOOP PO POWD: 1.0000 | Freq: Once | ORAL | 0 refills | Status: AC

## 2020-09-17 MED ORDER — OXYCODONE HCL 5 MG PO TABS: 5.0000 mg | ORAL_TABLET | Freq: Three times a day (TID) | ORAL | 0 refills | Status: AC | PRN

## 2020-09-17 NOTE — Progress Notes (Signed)
Discharge instructions explained to Patient's daughter and son-in-law, they will be taKING HER HOME AND HELPING TO CARE FOR HER. tHEY WILL ALSO PICK UP HER PRESCRIPTIONS. Questions were answered. Alexander was discharged via wheelchair. Three prescriptions were given to the daughter and a DNR certificate was also given. Bresha's port needle fell out on nights. Dr. Aileen Fass stated she did not need to be reaccessed to flush port with Heparin.

## 2020-09-17 NOTE — Progress Notes (Signed)
   Discussed with Daughter this am all equipment is in the home and they are ready to accept pt at home.The pt has been approved by our MD and we will be able to serve pt and family under hospice services when d/c from hospital. Webb Silversmith RN [phone]

## 2020-09-17 NOTE — TOC Transition Note (Signed)
Transition of Care [facility]) - CM/SW Discharge Note   Patient Details  Name: [name] MRN: [id number] Date of Birth: August 01, 1930  Transition of [facility]) CM/SW Contact:  Lynnell Catalan, RN Phone Number: 09/17/2020, 1:18 PM   Clinical Narrative:     Spoke with pt at bedside for dc planning. Pt to dc today with home hospice. Daughter to pick pt up at noon. Pt states that all DME has been delivered.            Readmission Risk Interventions Readmission Risk Prevention Plan 06/10/2019 05/31/2019  Transportation Screening Complete Complete  HRI or Home Care Consult Complete -  Social Work Consult for [place] Planning/Counseling Complete -  Palliative Care Screening Complete -  Medication Review [employer]) Complete Complete  PCP or Specialist appointment within 3-5 days of discharge - Complete  HRI or [place] - Complete  SW Recovery Care/Counseling Consult - Complete  [place] - Not Applicable  Some recent data might be hidden

## 2020-09-17 NOTE — Discharge Summary (Signed)
Physician Discharge Summary  Diamante Smalls Chronister [id number] DOB: 1930/11/18 DOA: 09/13/2020  PCP: [name], MD  Admit date: 09/13/2020 Discharge date: 09/17/2020  Admitted From: Home Disposition:  Home  Recommendations for Outpatient Follow-up:  [place] to follow-up with patient as an outpatient.  Home Health:No Equipment/Devices:yes  Discharge Condition:hospice CODE STATUS:DNR Diet recommendation: Heart Healthy  Brief/Interim Summary: 87 y.o. female past medical history of ovarian cancer CAD atrial fibrillation with a pacemaker in place presents to the ED complaining of severe left lower quadrant abdominal pain and left hip pain for 1 week in the ER exam revealed palpable mass in the left lower quadrant CT scan of the abdomen and pelvis showed worsening of the patient ovarian cancer.  Case discussed with GYN oncology and related she is not a surgical candidate or therapeutic options they recommended hospice.  Discharge Diagnoses:  Active Problems:   Metastatic cancer ([place])  Metastatic ovarian cancer: With significant progression of her ovarian cancer. It was discussed with GYN oncology and they related she had no further options for surgical or chemotherapeutic.  They recommended to move towards comfort care. She was started on narcotics which controlled her pain as well with Phenergan. She was given MiraLAX and she was having regular bowel movements. Perative care was consulted and the patient She would like to go home with hospice and then transition to residential hospice facility as an outpatient.  CAD: Plavix was discontinued.  Chronic atrial fibrillation: Eliquis was discontinued she will continue dofetilide and metoprolol.  Discharge Instructions  Discharge Instructions     Diet - low sodium heart healthy   Complete by: As directed    Increase activity slowly   Complete by: As directed       Allergies as of 09/17/2020       Reactions   Bee  Venom Anaphylaxis   Other Nausea And Vomiting, Other (See Comments)   Pain medications cause severe vomiting. Tolerated slow IV morphine drip   Oxycodone Hcl Nausea And Vomiting   Amiodarone Nausea Only   Chlorhexidine    Oxycodone-acetaminophen Other (See Comments)   Prednisone Palpitations, Other (See Comments)   "Rapid Heart Beat"        Medication List     STOP taking these medications    amLODipine 5 MG tablet Commonly known as: NORVASC   beta carotene w/minerals tablet   Coenzyme Q-10 100 MG capsule   rosuvastatin 40 MG tablet Commonly known as: CRESTOR       TAKE these medications    acetaminophen 500 MG tablet Commonly known as: TYLENOL Take 500 mg by mouth every 6 (six) hours as needed for mild pain or headache.   calcium citrate-vitamin D 315-200 MG-UNIT tablet Commonly known as: CITRACAL+D Take 1 tablet by mouth daily.   clopidogrel 75 MG tablet Commonly known as: PLAVIX TAKE 1 TABLET BY MOUTH EVERY DAY   digoxin 0.125 MG tablet Commonly known as: Lanoxin Take 0.5 tablets (0.0625 mg total) by mouth daily.   dofetilide 125 MCG capsule Commonly known as: TIKOSYN TAKE 1 CAPSULE BY MOUTH TWICE A DAY   furosemide 40 MG tablet Commonly known as: LASIX TAKE 2 TABLETS BY MOUTH EVERY MORNING AND 2 TABLETS AS NEEDED FOR WEIGHT GAIN/ABDOMINAL DISTENSION What changed: See the new instructions.   gabapentin 300 MG capsule Commonly known as: NEURONTIN Take 1 capsule (300 mg total) by mouth 2 (two) times daily.   isosorbide mononitrate 60 MG 24 hr tablet Commonly known as: IMDUR TAKE 1.5  TABLETS BY MOUTH DAILY What changed: See the new instructions.   magnesium oxide 400 MG tablet Commonly known as: MAG-OX Take 1 tablet (400 mg total) by mouth daily.   metoprolol 200 MG 24 hr tablet Commonly known as: TOPROL-XL TAKE 1 TABLET DAILY WITH OR IMMEDIATELY FOLLOWING A MEAL. What changed: See the new instructions.   multivitamin capsule Take 1 capsule  by mouth daily.   nitroGLYCERIN 0.4 MG SL tablet Commonly known as: NITROSTAT PLACE 1 TABLET (0.4 MG TOTAL) UNDER THE TONGUE EVERY 5 (FIVE) MINUTES AS NEEDED FOR CHEST PAIN   oxyCODONE 5 MG immediate release tablet Commonly known as: Roxicodone Take 1 tablet (5 mg total) by mouth every 8 (eight) hours as needed for up to 7 days.   polyethylene glycol powder 17 GM/SCOOP powder Commonly known as: MiraLax Take 255 g by mouth once for 1 dose.   potassium chloride SA 20 MEQ tablet Commonly known as: KLOR-CON Take 2 tablets (40 mEq total) by mouth 2 (two) times daily.   promethazine 6.25 MG/5ML syrup Commonly known as: PHENERGAN Take 10 mLs (12.5 mg total) by mouth 4 (four) times daily as needed for nausea or vomiting.   Xarelto 15 MG Tabs tablet Generic drug: Rivaroxaban TAKE 1 TABLET BY MOUTH DAILY WITH SUPPER What changed: how much to take        Allergies  Allergen Reactions   Bee Venom Anaphylaxis   Other Nausea And Vomiting and Other (See Comments)    Pain medications cause severe vomiting. Tolerated slow IV morphine drip   Oxycodone Hcl Nausea And Vomiting   Amiodarone Nausea Only   Chlorhexidine    Oxycodone-Acetaminophen Other (See Comments)   Prednisone Palpitations and Other (See Comments)    "Rapid Heart Beat"    Consultations: Hospice and palliative care   Procedures/Studies: CT HIP LEFT W CONTRAST  Result Date: 09/13/2020 CLINICAL DATA:  Septic arthritis suspected, hip, no prior imaging L hip pain, ovarian ca, concern for mets vs septic joint, fever EXAM: CT OF THE LOWER LEFT EXTREMITY WITH CONTRAST TECHNIQUE: Multidetector CT imaging of the lower left extremity was performed according to the standard protocol following intravenous contrast administration. CONTRAST:  32m OMNIPAQUE IOHEXOL 350 MG/ML SOLN COMPARISON:  CT 05/16/2020 FINDINGS: Bones/Joint/Cartilage No acute fracture or dislocation of the left hip. The visualized portion of the left hemipelvis  is intact without evidence of fracture or diastasis. No lytic or sclerotic bone lesion is identified. Mild osteoarthritis of the left hip as manifested by joint space narrowing, subchondral sclerosis/cystic change, and marginal osteophyte formation. No appreciable left hip joint effusion. Ligaments Suboptimally assessed by CT. Muscles and Tendons No acute musculotendinous abnormality by CT. Soft tissues No soft tissue edema or fluid collection. Vascular calcifications are present. Large left ovarian mass is seen within the pelvis. IMPRESSION: 1. No acute osseous abnormality of the left hip. No visible joint effusion or evidence to suggest septic arthritis by CT. 2. Mild osteoarthritis of the left hip. 3. Large left ovarian mass is seen within the pelvis. Please refer to dedicated same day CT abdomen-pelvis for detailed characterization of the intrapelvic findings. Electronically Signed   By: NDavina PokeD.O.   On: 09/13/2020 11:39   CT ABDOMEN PELVIS W CONTRAST  Result Date: 09/13/2020 CLINICAL DATA:  Left lower quadrant abdominal pain and fever. EXAM: CT ABDOMEN AND PELVIS WITH CONTRAST TECHNIQUE: Multidetector CT imaging of the abdomen and pelvis was performed using the standard protocol following bolus administration of intravenous contrast. CONTRAST:  728m  OMNIPAQUE IOHEXOL 350 MG/ML SOLN COMPARISON:  05/16/2020 FINDINGS: Lower chest: 2.1 cm nodule in the anterior right lower lobe was 1.9 cm previously. Several additional nodules are seen in the right middle and lower lobe. Hepatobiliary: Small subcapsular lesion posterior right liver with dystrophic calcification likely secondary to prior infection. Trace intrahepatic biliary duct dilatation is new in the interval. Gallbladder surgically absent. Common bile duct diameter upper normal in the head of pancreas. Pancreas: Mild prominence of the main pancreatic duct in the head of pancreas. Pancreatic duct in the body and tail of pancreas is nondilated. No  discrete pancreatic mass lesion evident. Spleen: No splenomegaly. No focal mass lesion. Adrenals/Urinary Tract: No adrenal nodule or mass. Left kidney is atrophic. Areas of focal cortical scarring noted right kidney. No evidence for hydroureter. The urinary bladder is nondistended and displaced to the right due to the left pelvic mass described below. Stomach/Bowel: Tiny hiatal hernia. Stomach otherwise unremarkable. Duodenum is normally positioned as is the ligament of Treitz. No small bowel wall thickening. No small bowel dilatation. The terminal ileum is normal. The appendix is normal. No gross colonic mass. No colonic wall thickening. Diverticular changes are noted in the left colon without evidence of diverticulitis. Vascular/Lymphatic: There is advanced atherosclerotic calcification of the abdominal aorta without aneurysm. There is no gastrohepatic or hepatoduodenal ligament lymphadenopathy. No retroperitoneal or mesenteric lymphadenopathy. No pelvic sidewall lymphadenopathy. Reproductive: Uterus surgically absent. Since the prior exam, marked progression of the left lower quadrant mass lesion, now measuring 11.0 x 9.7 cm compared to 9.5 x 6.9 cm previously. Lesion extends anteriorly to the left rectus sheath where there is new heterogeneous soft tissue thickening of the left rectus sheath, potentially related to direct tumor extension although associated component of rectus sheath hematoma could have this appearance. No right adnexal mass. Other: 1.3 x 1.1 cm midline omental nodule measured previously is 2.7 x 2.1 cm on image 50/2 today. Nodule on 44/7 was 2.8 x 2.7 cm central mesenteric 2.9 x 2.6 cm previously (remeasured). No intraperitoneal free fluid. Musculoskeletal: Evidence for prior ventral mesh placement. Small midline ventral hernia identified at the inferior margin of the mesh containing small bowel without complicating features. IMPRESSION: 1. Marked interval progression of the left lower quadrant  mass lesion, now measuring up to 11.0 x 9.7 cm compared to 9.5 x 6.9 cm previously. Lesion extends anteriorly to and apparently into the left rectus sheath where there is new heterogeneous soft tissue thickening/expansion of the left rectus sheath, potentially related to direct tumor extension although associated component of rectus sheath hematoma could have this appearance. 2. Interval increase in size of the midline omental nodule with similar appearance of the central mesenteric soft tissue nodule, both concerning for metastatic involvement. 3. Interval progression of pulmonary nodules in the right lung base consistent with metastatic disease. 4. New trace intrahepatic biliary duct dilatation with upper normal common bile duct diameter. Correlation with liver function test may prove helpful. 5. Left colonic diverticulosis without diverticulitis. 6. Small midline ventral hernia at the inferior margin of the mesh containing short segment small bowel without complicating features. 7. Aortic Atherosclerosis (ICD10-I70.0). Electronically Signed   By: Misty Stanley M.D.   On: 09/13/2020 12:12   DG Chest Port 1 View  Result Date: 09/13/2020 CLINICAL DATA:  Questionable sepsis EXAM: PORTABLE CHEST 1 VIEW COMPARISON:  Chest radiograph 06/27/2019 FINDINGS: A left chest wall cardiac device and associated leads and right chest wall port are in stable position. The port tip terminates in the  mid to upper SVC. A loop recorder is again seen projecting over the heart. The heart is mildly enlarged, unchanged. The mediastinal contours are otherwise within normal limits. There is calcified atherosclerotic plaque of the aortic arch. There is patchy opacity in the right base with a focal nodular component likely corresponding to the known right lower lobe metastatic lesion. Patchy retrocardiac opacities are also noted. There may be a trace left pleural effusion. There is no pneumothorax. There is marked degenerative change of  the right shoulder. There is no acute osseous abnormality. IMPRESSION: 1. Nodular opacity in the right base likely corresponds to the known metastatic lesion. Superimposed infection would be difficult to exclude. 2. Patchy retrocardiac opacities may reflect atelectasis or infection. 3. Trace left pleural effusion. 4. Unchanged cardiomegaly. Electronically Signed   By: Valetta Mole MD   On: 09/13/2020 10:22   (Echo, Carotid, EGD, Colonoscopy, ERCP)    Subjective: No complaints  Discharge Exam: Vitals:   09/16/20 1412 09/17/20 0525  BP:  (!) 159/52  Pulse: 64 60  Resp:  18  Temp:  98.2 F (36.8 C)  SpO2:  94%   Vitals:   09/16/20 1407 09/16/20 1409 09/16/20 1412 09/17/20 0525  BP: (!) 158/51 (!) 158/51  (!) 159/52  Pulse: 64 64 64 60  Resp:    18  Temp: 98.1 F (36.7 C)   98.2 F (36.8 C)  TempSrc: Oral   Oral  SpO2: 94%   94%  Weight:      Height:        General: Pt is alert, awake, not in acute distress Cardiovascular: RRR, S1/S2 +, no rubs, no gallops Respiratory: CTA bilaterally, no wheezing, no rhonchi Abdominal: Soft, NT, ND, bowel sounds + Extremities: no edema, no cyanosis    The results of significant diagnostics from this hospitalization (including imaging, microbiology, ancillary and laboratory) are listed below for reference.     Microbiology: Recent Results (from the past 240 hour(s))  Blood culture (routine single)     Status: None (Preliminary result)   Collection Time: 09/13/20  9:54 AM   Specimen: BLOOD  Result Value Ref Range Status   Specimen Description   Final    BLOOD LEFT ANTECUBITAL Performed at [facility] [address] [place], [place] [address]    Special Requests   Final    BOTTLES DRAWN AEROBIC AND ANAEROBIC Blood Culture results may not be optimal due to an excessive volume of blood received in culture bottles Performed at [facility] [address] [place], [place] [address]    Culture    Final    NO GROWTH 4 DAYS Performed at [facility], [place] [address] [place], [place] [address]    Report Status PENDING  Incomplete  Resp Panel by RT-PCR (Flu A&B, Covid) Nasopharyngeal Swab     Status: None   Collection Time: 09/13/20 10:26 AM   Specimen: Nasopharyngeal Swab; Nasopharyngeal(NP) swabs in vial transport medium  Result Value Ref Range Status   SARS Coronavirus 2 by RT PCR NEGATIVE NEGATIVE Final    Comment: (NOTE) SARS-CoV-2 target nucleic acids are NOT DETECTED.  The SARS-CoV-2 RNA is generally detectable in upper respiratory specimens during the acute phase of infection. The lowest concentration of SARS-CoV-2 viral copies this assay can detect is 138 copies/mL. A negative result does not preclude SARS-Cov-2 infection and should not be used as the sole basis for treatment or other patient management decisions. A negative result may occur  with  improper specimen collection/handling, submission of specimen other than nasopharyngeal swab, presence of viral mutation(s) within the areas targeted by this assay, and inadequate number of viral copies(<138 copies/mL). A negative result must be combined with clinical observations, patient history, and epidemiological information. The expected result is Negative.  Fact Sheet for Patients:  EntrepreneurPulse.com.au  Fact Sheet for Healthcare Providers:  IncredibleEmployment.be  This test is no t yet approved or cleared by the [place] FDA and  has been authorized for detection and/or diagnosis of SARS-CoV-2 by FDA under an Emergency Use Authorization (EUA). This EUA will remain  in effect (meaning this test can be used) for the duration of the COVID-19 declaration under Section 564(b)(1) of the Act, 21 U.S.C.section 360bbb-3(b)(1), unless the authorization is terminated  or revoked sooner.       Influenza A by PCR NEGATIVE NEGATIVE Final   Influenza B by PCR NEGATIVE  NEGATIVE Final    Comment: (NOTE) The Xpert Xpress SARS-CoV-2/FLU/RSV plus assay is intended as an aid in the diagnosis of influenza from Nasopharyngeal swab specimens and should not be used as a sole basis for treatment. Nasal washings and aspirates are unacceptable for Xpert Xpress SARS-CoV-2/FLU/RSV testing.  Fact Sheet for Patients: EntrepreneurPulse.com.au  Fact Sheet for Healthcare Providers: IncredibleEmployment.be  This test is not yet approved or cleared by the [place] FDA and has been authorized for detection and/or diagnosis of SARS-CoV-2 by FDA under an Emergency Use Authorization (EUA). This EUA will remain in effect (meaning this test can be used) for the duration of the COVID-19 declaration under Section 564(b)(1) of the Act, 21 U.S.C. section 360bbb-3(b)(1), unless the authorization is terminated or revoked.  Performed at [facility], [place] [address] [place], [place] [address]   Urine Culture     Status: Abnormal   Collection Time: 09/13/20 10:49 AM   Specimen: In/Out Cath Urine  Result Value Ref Range Status   Specimen Description   Final    IN/OUT CATH URINE Performed at [facility] [address], [place], [place] [address]    Special Requests   Final    NONE Performed at [facility], [place] [address], [place], [place] [address]    Culture MULTIPLE SPECIES PRESENT, SUGGEST RECOLLECTION (A)  Final   Report Status 09/15/2020 FINAL  Final     Labs: BNP (last 3 results) No results for input(s): BNP in the last 8760 hours. Basic Metabolic Panel: Recent Labs  Lab 09/13/20 0954  NA 139  K 3.8  CL 103  CO2 27  GLUCOSE 89  BUN 14  CREATININE 0.85  CALCIUM 8.8*   Liver Function Tests: Recent Labs  Lab 09/13/20 0954  AST 35  ALT 16  ALKPHOS 80  BILITOT 0.6  PROT 7.3  ALBUMIN 3.1*   Recent Labs  Lab 09/13/20 0954  LIPASE 39   No results  for input(s): AMMONIA in the last 168 hours. CBC: Recent Labs  Lab 09/13/20 0954  WBC 8.4  NEUTROABS 6.6  HGB 12.2  HCT 37.2  MCV 97.9  PLT 293   Cardiac Enzymes: No results for input(s): CKTOTAL, CKMB, CKMBINDEX, TROPONINI in the last 168 hours. BNP: Invalid input(s): POCBNP CBG: No results for input(s): GLUCAP in the last 168 hours. D-Dimer No results for input(s): DDIMER in the last 72 hours. Hgb A1c No results for input(s): HGBA1C in the last 72 hours. Lipid Profile No results for input(s): CHOL, HDL, LDLCALC, TRIG, CHOLHDL, LDLDIRECT in the  last 72 hours. Thyroid function studies No results for input(s): TSH, T4TOTAL, T3FREE, THYROIDAB in the last 72 hours.  Invalid input(s): FREET3 Anemia work up No results for input(s): VITAMINB12, FOLATE, FERRITIN, TIBC, IRON, RETICCTPCT in the last 72 hours. Urinalysis    Component Value Date/Time   COLORURINE YELLOW 09/13/2020 1049   APPEARANCEUR CLEAR 09/13/2020 1049   LABSPEC 1.032 (H) 09/13/2020 1049   PHURINE 7.0 09/13/2020 1049   GLUCOSEU NEGATIVE 09/13/2020 1049   HGBUR NEGATIVE 09/13/2020 1049   BILIRUBINUR NEGATIVE 09/13/2020 1049   KETONESUR 20 (A) 09/13/2020 1049   PROTEINUR NEGATIVE 09/13/2020 1049   UROBILINOGEN 0.2 12/11/2013 1119   NITRITE NEGATIVE 09/13/2020 1049   LEUKOCYTESUR NEGATIVE 09/13/2020 1049   Sepsis Labs Invalid input(s): PROCALCITONIN,  WBC,  LACTICIDVEN Microbiology Recent Results (from the past 240 hour(s))  Blood culture (routine single)     Status: None (Preliminary result)   Collection Time: 09/13/20  9:54 AM   Specimen: BLOOD  Result Value Ref Range Status   Specimen Description   Final    BLOOD LEFT ANTECUBITAL Performed at [facility], [place] [address], [place], [place] [address]    Special Requests   Final    BOTTLES DRAWN AEROBIC AND ANAEROBIC Blood Culture results may not be optimal due to an excessive volume of blood received in culture bottles Performed at  [facility] [address] [place], [place] [address]    Culture   Final    NO GROWTH 4 DAYS Performed at [facility], [place] [address] [place], [place] [address]    Report Status PENDING  Incomplete  Resp Panel by RT-PCR (Flu A&B, Covid) Nasopharyngeal Swab     Status: None   Collection Time: 09/13/20 10:26 AM   Specimen: Nasopharyngeal Swab; Nasopharyngeal(NP) swabs in vial transport medium  Result Value Ref Range Status   SARS Coronavirus 2 by RT PCR NEGATIVE NEGATIVE Final    Comment: (NOTE) SARS-CoV-2 target nucleic acids are NOT DETECTED.  The SARS-CoV-2 RNA is generally detectable in upper respiratory specimens during the acute phase of infection. The lowest concentration of SARS-CoV-2 viral copies this assay can detect is 138 copies/mL. A negative result does not preclude SARS-Cov-2 infection and should not be used as the sole basis for treatment or other patient management decisions. A negative result may occur with  improper specimen collection/handling, submission of specimen other than nasopharyngeal swab, presence of viral mutation(s) within the areas targeted by this assay, and inadequate number of viral copies(<138 copies/mL). A negative result must be combined with clinical observations, patient history, and epidemiological information. The expected result is Negative.  Fact Sheet for Patients:  EntrepreneurPulse.com.au  Fact Sheet for Healthcare Providers:  IncredibleEmployment.be  This test is no t yet approved or cleared by the [place] FDA and  has been authorized for detection and/or diagnosis of SARS-CoV-2 by FDA under an Emergency Use Authorization (EUA). This EUA will remain  in effect (meaning this test can be used) for the duration of the COVID-19 declaration under Section 564(b)(1) of the Act, 21 U.S.C.section 360bbb-3(b)(1), unless the authorization is terminated  or revoked sooner.        Influenza A by PCR NEGATIVE NEGATIVE Final   Influenza B by PCR NEGATIVE NEGATIVE Final    Comment: (NOTE) The Xpert Xpress SARS-CoV-2/FLU/RSV plus assay is intended as an aid in the diagnosis of influenza from Nasopharyngeal swab specimens and should not be used as a sole basis for treatment. Nasal  washings and aspirates are unacceptable for Xpert Xpress SARS-CoV-2/FLU/RSV testing.  Fact Sheet for Patients: EntrepreneurPulse.com.au  Fact Sheet for Healthcare Providers: IncredibleEmployment.be  This test is not yet approved or cleared by the [place] FDA and has been authorized for detection and/or diagnosis of SARS-CoV-2 by FDA under an Emergency Use Authorization (EUA). This EUA will remain in effect (meaning this test can be used) for the duration of the COVID-19 declaration under Section 564(b)(1) of the Act, 21 U.S.C. section 360bbb-3(b)(1), unless the authorization is terminated or revoked.  Performed at [facility], [place] [address], [place], [place] [address]   Urine Culture     Status: Abnormal   Collection Time: 09/13/20 10:49 AM   Specimen: In/Out Cath Urine  Result Value Ref Range Status   Specimen Description   Final    IN/OUT CATH URINE Performed at [facility] [address] [place], [place] [address]    Special Requests   Final    NONE Performed at [facility], [place] [address] [place], [place] [address]    Culture MULTIPLE SPECIES PRESENT, SUGGEST RECOLLECTION (A)  Final   Report Status 09/15/2020 FINAL  Final     Time coordinating discharge: Over 30 minutes  SIGNED:   Charlynne Cousins, MD  [facility] 09/17/2020, 9:54 AM Pager   If 7PM-7AM, please contact night-coverage [email or web address] Password [email or web address]

## 2020-09-18 LAB — CULTURE, BLOOD (SINGLE): Culture: NO GROWTH

## 2020-10-11 ENCOUNTER — Telehealth: Payer: Self-pay | Admitting: Interventional Cardiology

## 2020-10-11 NOTE — Telephone Encounter (Signed)
Encounter not needed

## 2020-10-16 ENCOUNTER — Telehealth: Payer: Self-pay | Admitting: Oncology

## 2020-10-16 ENCOUNTER — Other Ambulatory Visit: Payer: Self-pay

## 2020-10-16 ENCOUNTER — Encounter: Payer: Self-pay | Admitting: Podiatry

## 2020-10-16 ENCOUNTER — Ambulatory Visit: Payer: Medicare Other | Admitting: Podiatry

## 2020-10-16 DIAGNOSIS — Z7901 Long term (current) use of anticoagulants: Secondary | ICD-10-CM | POA: Diagnosis not present

## 2020-10-16 DIAGNOSIS — D689 Coagulation defect, unspecified: Secondary | ICD-10-CM | POA: Diagnosis not present

## 2020-10-16 DIAGNOSIS — G620 Drug-induced polyneuropathy: Secondary | ICD-10-CM | POA: Diagnosis not present

## 2020-10-16 DIAGNOSIS — T451X5A Adverse effect of antineoplastic and immunosuppressive drugs, initial encounter: Secondary | ICD-10-CM

## 2020-10-16 DIAGNOSIS — B351 Tinea unguium: Secondary | ICD-10-CM

## 2020-10-16 DIAGNOSIS — M79676 Pain in unspecified toe(s): Secondary | ICD-10-CM

## 2020-10-16 NOTE — Telephone Encounter (Signed)
Left a message for Patty [name] at Patty [name] office advising that Patty [name] does not want to make an apt with Patty [name] since she is on hospice.

## 2020-10-16 NOTE — Progress Notes (Signed)
This patient returns to my office for at risk foot care.  This patient requires this care by a professional since this patient will be at risk due to having peripheral neuropathy, and coagulation defect.  Patient is taking plavix and xarelto.  This patient is unable to cut nails herself since the patient cannot reach her nails.These nails are painful walking and wearing shoes.  This patient presents for at risk foot care today.  General Appearance  Alert, conversant and in no acute stress.  Vascular  Dorsalis pedis and posterior tibial  pulses are palpable  bilaterally.  Capillary return is within normal limits  bilaterally. Temperature is within normal limits  bilaterally.  Neurologic  Senn-Weinstein monofilament wire test within normal limits  bilaterally. Muscle power within normal limits bilaterally.  Nails Thick disfigured discolored nails with subungual debris  from hallux to fifth toes bilaterally. No evidence of bacterial infection or drainage bilaterally.  Orthopedic  No limitations of motion  feet .  No crepitus or effusions noted.  No bony pathology or digital deformities noted.  HAV  B/L.  Skin  normotropic skin with no porokeratosis noted bilaterally.  No signs of infections or ulcers noted.  Pinch callus left hallux.   Onychomycosis  Pain in right toes  Pain in left toes  Consent was obtained for treatment procedures.   Mechanical debridement of nails 1-5  bilaterally performed with a nail nipper.  Filed with dremel without incident.    Return office visit   9  weeks                  Told patient to return for periodic foot care and evaluation due to potential at risk complications.   Gardiner Barefoot DPM

## 2020-10-16 NOTE — Telephone Encounter (Signed)
Mal Amabile and discussed referral to see Dr. Denman George.  She said she is fully under hospice care now and does not need an appointment with Dr. Denman George.  She did say to let Dr. Denman George know that she appreciates everything that she has done for her.  Kaveah Carruth to call if she needs anything.

## 2020-10-30 ENCOUNTER — Encounter: Payer: Self-pay | Admitting: Hematology and Oncology

## 2020-11-03 ENCOUNTER — Encounter: Payer: Self-pay | Admitting: Hematology and Oncology

## 2020-11-06 ENCOUNTER — Ambulatory Visit (INDEPENDENT_AMBULATORY_CARE_PROVIDER_SITE_OTHER): Payer: Medicare Other

## 2020-11-06 DIAGNOSIS — I495 Sick sinus syndrome: Secondary | ICD-10-CM

## 2020-11-06 LAB — CUP PACEART REMOTE DEVICE CHECK
Battery Remaining Longevity: 73 mo
Battery Remaining Percentage: 63 %
Battery Voltage: 2.99 V
Brady Statistic AP VP Percent: 40 %
Brady Statistic AP VS Percent: 3.2 %
Brady Statistic AS VP Percent: 39 %
Brady Statistic AS VS Percent: 18 %
Brady Statistic RA Percent Paced: 43 %
Brady Statistic RV Percent Paced: 79 %
Date Time Interrogation Session: 20221004020610
Implantable Lead Implant Date: 20181224
Implantable Lead Implant Date: 20181224
Implantable Lead Location: 753859
Implantable Lead Location: 753860
Implantable Pulse Generator Implant Date: 20181224
Lead Channel Impedance Value: 1150 Ohm
Lead Channel Impedance Value: 460 Ohm
Lead Channel Pacing Threshold Amplitude: 0.75 V
Lead Channel Pacing Threshold Amplitude: 0.75 V
Lead Channel Pacing Threshold Pulse Width: 0.4 ms
Lead Channel Pacing Threshold Pulse Width: 0.4 ms
Lead Channel Sensing Intrinsic Amplitude: 5 mV
Lead Channel Sensing Intrinsic Amplitude: 6.3 mV
Lead Channel Setting Pacing Amplitude: 2.5 V
Lead Channel Setting Pacing Amplitude: 2.5 V
Lead Channel Setting Pacing Pulse Width: 0.4 ms
Lead Channel Setting Sensing Sensitivity: 2 mV
Pulse Gen Model: 2272
Pulse Gen Serial Number: 8968200

## 2020-11-13 ENCOUNTER — Encounter: Payer: Self-pay | Admitting: Hematology and Oncology

## 2020-11-14 NOTE — Progress Notes (Signed)
Remote pacemaker transmission.   

## 2020-12-04 DEATH — deceased
